# Patient Record
Sex: Female | Born: 1980 | Race: Black or African American | Hispanic: No | Marital: Single
Health system: Midwestern US, Community
[De-identification: ages and names within clinical notes are randomized; demographics above are authoritative.]

## PROBLEM LIST (undated history)

## (undated) DIAGNOSIS — D5700 Hb-SS disease with crisis, unspecified: Secondary | ICD-10-CM

## (undated) DIAGNOSIS — D571 Sickle-cell disease without crisis: Secondary | ICD-10-CM

## (undated) DIAGNOSIS — B009 Herpesviral infection, unspecified: Secondary | ICD-10-CM

## (undated) DIAGNOSIS — R000 Tachycardia, unspecified: Secondary | ICD-10-CM

## (undated) DIAGNOSIS — R110 Nausea: Secondary | ICD-10-CM

## (undated) DIAGNOSIS — F32A Depression, unspecified: Secondary | ICD-10-CM

## (undated) DIAGNOSIS — E559 Vitamin D deficiency, unspecified: Secondary | ICD-10-CM

## (undated) DIAGNOSIS — J189 Pneumonia, unspecified organism: Secondary | ICD-10-CM

## (undated) DIAGNOSIS — G8929 Other chronic pain: Secondary | ICD-10-CM

## (undated) DIAGNOSIS — D649 Anemia, unspecified: Secondary | ICD-10-CM

## (undated) DIAGNOSIS — R0902 Hypoxemia: Secondary | ICD-10-CM

## (undated) HISTORY — DX: Nausea: R11.0

## (undated) HISTORY — DX: Vitamin D deficiency, unspecified: E55.9

## (undated) HISTORY — DX: Sickle-cell disease without crisis: D57.1

## (undated) HISTORY — DX: Herpesviral infection, unspecified: B00.9

---

## 2008-11-09 LAB — HEPATITIS B SURFACE ANTIBODY
Hep B S Ab Interp: POSITIVE
Hep B S Ab: 3.76 Index (ref >1.00)

## 2008-11-09 LAB — HEP B SURFACE AB
Hep B surface Ab Interp.: POSITIVE
Hepatitis B surface Ab: 3.76 Index (ref >1.00)

## 2008-11-10 LAB — VZV AB, IGG: VZV Ab, IgG: 3.52 IV

## 2011-02-04 DIAGNOSIS — Z9289 Personal history of other medical treatment: Secondary | ICD-10-CM

## 2011-02-04 HISTORY — DX: Personal history of other medical treatment: Z92.89

## 2012-06-16 ENCOUNTER — Encounter

## 2012-06-16 MED ORDER — HYDROXYUREA 500 MG CAPSULE
500 mg | ORAL_CAPSULE | Freq: Every day | ORAL | Status: DC
Start: 2012-06-16 — End: 2013-01-12

## 2012-06-16 MED ORDER — TRAMADOL 50 MG TAB
50 mg | ORAL_TABLET | Freq: Three times a day (TID) | ORAL | Status: DC | PRN
Start: 2012-06-16 — End: 2013-01-04

## 2012-06-16 NOTE — Progress Notes (Signed)
Pt left w/o getting labs drawn. Tried to contact patient and phone number that was given not in service.

## 2012-06-16 NOTE — Progress Notes (Signed)
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    Subjective:   Chief complaint: Sickle cell disease/anemia    History of present illness:  Chelsea Webb is a 32 year old woman who has sickle cell, SC.  She recently moved back to this area from [place] and is here for a complete assessment to establish care in this clinic.  She reports that over the past several months she has been experiencing more frequent pain crises for which she takes tramadol 50 mg every 6-8 hours.  She has not been on hydroxyurea but reports that she continues to take folic acid on a regular basis.  There are no other complaints or concerns at this time.    Past Medical History   Diagnosis Date   ??? Chronic pain    ??? Anemia    ??? Musculoskeletal disorder        Allergies no known allergies    No past surgical history on file.    History     Social History   ??? Marital Status: SINGLE     Spouse Name: N/A     Number of Children: N/A   ??? Years of Education: N/A     Occupational History   ??? Not on file.     Social History Main Topics   ??? Smoking status: Never Smoker    ??? Smokeless tobacco: Not on file   ??? Alcohol Use: No   ??? Drug Use: No   ??? Sexually Active: Yes      Comment: LMP 05/29/12     Other Topics Concern   ??? Not on file     Social History Narrative   ??? No narrative on file       Family History   Problem Relation Age of Onset   ??? Diabetes Father        Current Outpatient Prescriptions   Medication Sig Dispense Refill   ??? traMADol (ULTRAM) 50 mg tablet Take 50 mg by mouth every six (6) hours as needed for Pain.         Review of Systems    General ROS:The patient has no complaints and there is no physical distress evident.  Psychological ROS: patient denies having any psychological symptoms such as hallucinations depression or anxiety.  Ophthalmic ROS:the patient denies having any visual impairment or eye discomfort.   ENT ROS: there are no abnormalities reported.  Allergy and Immunology ROS:the patient denies having any seasonal allergies or allergies to medications other than those already outlined above.  Hematological and Lymphatic ROS: the patient denies having any bruising, bleeding or lymphadenopathy.  Endocrine ROS: the patient denies having any heat or cold intolerance.  There is no history of diabetes or thyroid disorders.  Breast ROS: the patient denies having any history of breast mass, nipple discharge, or lumps.  Respiratory ROS:the patient denies having any cough, shortness of breath, or dyspnea on exertion.  Cardiovascular ROS: there are no complaints of chest pain, palpitations, chest pounding, or dyspnea on exertion.  Gastrointestinal ROS: the patient denies having nausea, emesis, diarrhea, constipation, or blood in the stool.  Genito-Urinary ROS: the patient denies having urinary urgency, frequency, or dysuria.  Musculoskeletal ROS: with the exception of mild arthralgias the patient has no other musculoskeletal complaints.  Neurological ROS: the patient denies having any numbness, tingling, or neurologic  deficits.  Dermatological ZOX:WRUEAVW denies having any unexplained rash, skin ulcerations, or hives.      Objective:   BP 131/80   Pulse 83   Temp(Src) 98 ??F (36.7 ??C) (Oral)   Wt 125.646 kg (277 lb)     Physical Exam:   Gen. Appearance: the patient is in no acute distress.  Skin: There is no evidence of bruise or rash.  HEENT: The head is normocephalic and atraumatic.  The conjunctiva and sclera are clear.  Pupils are equal, round, reactive to light, and accommodation.  The extraocular movements are intact.  ENT reveals no oral mucosal lesions or ulcerations.  Neck: Supple without lymphadenopathy or thyromegaly.  Lungs: Clear to auscultation and percussion; there are no wheezes or rhonchi.  Heart: Regular rate and rhythm; there are no murmurs, gallops, or rubs.  Abdomen: Bowel sounds are present and normal.   There is no guarding, tenderness, or hepatosplenomegaly.  Neurologic: There are no focal neurologic deficits.  Lymphatics: There is no palpable peripheral lymphadenopathy.    Lab data:  No data is available         Assessment:   1. 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  I am recommending that we began hydroxyurea 500 mg daily.  I will renew her prescription for tramadol and folate acid and will plan to see her back in the clinic in 2 months.  The patient does not have a primary care physician, therefore, she will be referred to Dr. Levan Hurst.    Orders Placed This Encounter   ??? METABOLIC PANEL, COMPREHENSIVE   ??? IRON PROFILE   ??? FERRITIN   ??? VITAMIN B12 & FOLATE   ??? traMADol (ULTRAM) 50 mg tablet     Sig: Take 50 mg by mouth every six (6) hours as needed for Pain.           Kennon Holter, MD  06/16/2012

## 2012-06-22 LAB — METABOLIC PANEL, COMPREHENSIVE
A-G Ratio: 1.6 (ref 1.1–2.5)
ALT (SGPT): 10 IU/L (ref 0–32)
AST (SGOT): 12 IU/L (ref 0–40)
Albumin: 4.1 g/dL (ref 3.5–5.5)
Alk. phosphatase: 76 IU/L (ref 39–117)
BUN/Creatinine ratio: 17 (ref 8–20)
BUN: 10 mg/dL (ref 6–20)
Bilirubin, total: 0.7 mg/dL (ref 0.0–1.2)
CO2: 25 mmol/L (ref 19–28)
Calcium: 9.7 mg/dL (ref 8.7–10.2)
Chloride: 107 mmol/L (ref 97–108)
Creatinine: 0.6 mg/dL (ref 0.57–1.00)
GFR est AA: 142 mL/min/{1.73_m2} (ref >59)
GFR est non-AA: 124 mL/min/{1.73_m2} (ref >59)
GLOBULIN, TOTAL: 2.6 g/dL (ref 1.5–4.5)
Glucose: 79 mg/dL (ref 65–99)
Potassium: 4.4 mmol/L (ref 3.5–5.2)
Protein, total: 6.7 g/dL (ref 6.0–8.5)
Sodium: 142 mmol/L (ref 134–144)

## 2012-06-22 LAB — IRON PROFILE
Iron % saturation: 20 % (ref 15–55)
Iron: 63 ug/dL (ref 35–155)
TIBC: 308 ug/dL (ref 250–450)
UIBC: 245 ug/dL (ref 150–375)

## 2012-06-22 LAB — VITAMIN B12 & FOLATE
Folate: 13.2 ng/mL (ref >3.0)
Vitamin B12: 189 pg/mL — ABNORMAL LOW (ref 211–946)

## 2012-06-22 LAB — FERRITIN: Ferritin: 105 ng/mL (ref 15–150)

## 2012-06-22 NOTE — Progress Notes (Signed)
Quick Note:    Labs reviewed will start vitamin b12 supplement.  ______

## 2013-01-04 MED ORDER — TRAMADOL 50 MG TAB
50 mg | ORAL_TABLET | Freq: Three times a day (TID) | ORAL | Status: AC | PRN
Start: 2013-01-04 — End: ?

## 2013-01-04 NOTE — Telephone Encounter (Signed)
Pt need refill

## 2013-01-12 LAB — CBC WITH 3 PART DIFF
ABS. LYMPHOCYTES: 4.3 10*3/uL (ref 1.1–5.9)
ABS. MIXED CELLS: 1.9 10*3/uL (ref 0.0–2.3)
ABS. NEUTROPHILS: 15.1 10*3/uL — ABNORMAL HIGH (ref 1.8–9.5)
HCT: 27.9 % — ABNORMAL LOW (ref 36–48)
HGB: 9.3 g/dL — ABNORMAL LOW (ref 12.0–16.0)
LYMPHOCYTES: 20 % (ref 14–44)
MCH: 23.7 PG — ABNORMAL LOW (ref 25.0–35.0)
MCHC: 33.3 g/dL (ref 31–37)
MCV: 71.2 FL — ABNORMAL LOW (ref 78–102)
Mixed cells: 9 % (ref 0.1–17)
NEUTROPHILS: 71 % — ABNORMAL HIGH (ref 40–70)
RBC: 3.92 M/uL — ABNORMAL LOW (ref 4.10–5.10)
RDW: 16.4 % — ABNORMAL HIGH (ref 11.5–14.5)
WBC: 21.3 10*3/uL — CR (ref 4.5–13.0)

## 2013-01-12 MED ORDER — OXYCODONE-ACETAMINOPHEN 5 MG-325 MG TAB
5-325 mg | ORAL_TABLET | ORAL | Status: DC | PRN
Start: 2013-01-12 — End: 2013-03-16

## 2013-01-12 MED ORDER — HYDROXYUREA 500 MG CAPSULE
500 mg | ORAL_CAPSULE | Freq: Every day | ORAL | Status: DC
Start: 2013-01-12 — End: 2013-04-25

## 2013-01-12 NOTE — Progress Notes (Signed)
Hematology/Oncology  Progress Note    Name: Chelsea Webb  Date: 01/12/2013  DOB: [birth year]  Primary Care Provider: No primary provider on file.        Chelsea Webb is a 32 y.o. year old female with sickle cell disease, anemia and frequent pain crisis.     Current Therapy: none    Subjective:     Chelsea Webb is here today for fu ov. She has not been seen since May 2014. She reports never starting the Hydrea because she was concerned with the side effects. She also has not been taking folic acid or Z61 as previously recommended. She has had many frequent pain crisis primarily in her knees and legs but since Saturday has also developed left upper quadrant pain. She has a low grade fever of 100.6 but denies any fevers or chills at home. No recent sick contacts, denies any urinary symptoms such as dysuria or frequency. She has no cough or upper respiratory symptoms. Her WBC is elevated at 21 and she states her baseline is 12. She has no GI or GU complaints today. She has been taking Tramadol up to 6 tablets a day with minimal relief.     The past medical, surgical and social history has been reviewed and remains unchanged.   Past Medical History   Diagnosis Date   ??? Chronic pain    ??? Anemia    ??? Musculoskeletal disorder      No past surgical history on file.  History     Social History   ??? Marital Status: SINGLE     Spouse Name: N/A     Number of Children: N/A   ??? Years of Education: N/A     Occupational History   ??? Not on file.     Social History Main Topics   ??? Smoking status: Never Smoker    ??? Smokeless tobacco: Not on file   ??? Alcohol Use: No   ??? Drug Use: No   ??? Sexually Active: Yes      Comment: LMP 05/29/12     Other Topics Concern   ??? Not on file     Social History Narrative   ??? No narrative on file     Family History   Problem Relation Age of Onset   ??? Diabetes Father      Current Outpatient Prescriptions   Medication Sig Dispense Refill   ??? folic acid (FOLVITE) 1 mg tablet Take  by mouth daily.       ???  oxyCODONE-acetaminophen (PERCOCET) 5-325 mg per tablet Take 1 tablet by mouth every four (4) hours as needed for Pain.  90 tablet  0   ??? hydroxyurea (HYDREA) 500 mg capsule Take 1 capsule by mouth daily.  30 capsule  2   ??? traMADol (ULTRAM) 50 mg tablet Take 1 tablet by mouth every eight (8) hours as needed for Pain.  90 tablet  2       Review of Systems  General :The patient has no complaints and there is no physical distress evident. She has generalized pain esp in the legs.   Psychological : patient denies having any psychological symptoms such as hallucinations depression or anxiety.   Ophthalmic:the patient denies having any visual impairment or eye discomfort.   ENT: there are no abnormalities reported.   Allergy and Immunology:the patient denies having any seasonal allergies or allergies to medications other than those already outlined above.   Hematological and Lymphatic: the patient denies  having any bruising, bleeding or lymphadenopathy.   Endocrine: the patient denies having any heat or cold intolerance. There is no history of diabetes or thyroid disorders.   Respiratory:the patient denies having any cough, shortness of breath, or dyspnea on exertion.   Cardiovascular: there are no complaints of chest pain, palpitations, chest pounding, or dyspnea on exertion. bp is elevated today  Gastrointestinal: the patient denies having nausea, emesis, diarrhea, constipation, or blood in the stool. Pain in the left upper quadrant. No nausea or vomiting.   Genito-Urinary: the patient denies having urinary urgency, frequency, or dysuria.   Musculoskeletal: pain in the bilateral knees, left >right.   Neurological:  denies having any numbness, tingling, or neurologic deficits.   Dermatological: patient denies having any unexplained rash, skin ulcerations, or hives.    Objective:   BP 148/117   Pulse 64   Temp(Src) 100.6 ??F (38.1 ??C) (Oral)   Resp 20   Wt 118.026 kg (260 lb 3.2 oz)  Repeat BP was 128/86 left arm manual   Pain Score: 8/10    Physical Exam:   ECOG: na  General: Well appearing, in NAD, stating she has pain  Skin: examination of the skin reveals no bruising, rash or petechiae  HEENT: Normocephalic, atraumatic. Conjunctiva and sclera are clear. Pupils are equal, round and reactive to light. EOMs are intact. ENT without oral mucosal lesions, stomatitis or thrush  Neck: supple without lymphadenopathy, JVD or thyromegaly  Lymphatics: no palpable cervical, supraclavicular, axillary or inguinal lymphadenopathy  Lungs: clear breath sounds bilaterally, no rhonchi or wheezes noted  Heart: Regular rate and rhythm, no murmurs, rubs or gallops, S1-S2 noted. Positive peripheral pulses bilaterally upper and lower extremities  Abdomen: soft, tender LUQ to palpation. No splenomegaly, non-distended, no HSM, positive bowel sounds  Extremities: without clubbing, cyanosis or edema, no point tenderness.   Neurologic: no focal deficits, steady gait, Alert and oriented x 3.  Psychologic: mood and affect are appropriate, no anxiety or depression noted    Laboratory Data:     Results for orders placed during the hospital encounter of 01/12/13   CBC WITH 3 PART DIFF     Status: Abnormal       Result Value Range Status    WBC 21.3 (*) 4.5 - 13.0 K/uL Final    RBC 3.92 (*) 4.10 - 5.10 M/uL Final    HGB 9.3 (*) 12.0 - 16.0 g/dL Final    HCT 16.1 (*) 36 - 48 % Final    MCV 71.2 (*) 78 - 102 FL Final    MCH 23.7 (*) 25.0 - 35.0 PG Final    MCHC 33.3  31 - 37 g/dL Final    RDW 09.6 (*) 04.5 - 14.5 % Final    NEUTROPHILS 71 (*) 40 - 70 % Final    MIXED CELLS 9  0.1 - 17 % Final    LYMPHOCYTES 20  14 - 44 % Final    ABS. NEUTROPHILS 15.1 (*) 1.8 - 9.5 K/UL Final    ABS. MIXED CELLS 1.9  0.0 - 2.3 K/uL Final    ABS. LYMPHOCYTES 4.3  1.1 - 5.9 K/UL Final    Comment: Test Performed by [facility]. Results reviewed by Medical Director.    DF AUTOMATED   Final            Patient Active Problem List   Diagnosis Code   ??? Sickle cell disease 282.60          Assessment:  1. Sickle cell disease    2. Left upper quadrant pain    3. Sickle cell anemia    4. Thrombocytopenia, unspecified      I have informed the patient that her CBC reveals a WBC of 21, hgb 9.3g/dL, hematocrit of 16.1% and plt count preliminary of 90,000 to be confirmed at [facility].     Plan:   1. Sickle cell disease. She has continued to have frequent pain crisis. We discussed her trying the hydrea 500 mg daily which she is in agreement to start to help better control her crisis. I have also prescribed Percocet 5/325mg  tablets to take 1 tab every 4-6 hours as needed for pain not to be used at the same time as tramadol. I would like her to return in 2 weeks for re-assessment.     2. LUQ pain. I will send the patient for a US of the LUQ to rule out splenic infarction from her sickle cell. She was advised that with severe pain or any changes to go to the ER.     3. Anemia. H/h stable today. Will continue to monitor. Will check iron and ferritin levels as well as b12/folate. Recommended she go back on her Folic acid 1 mg daily and b12 daily as previously recommended.     4. Thrombocytopenia. Will need to send labs to [facility] for final results as plt count came back at 90,000. There are no signs of bleeding or bruising and she will have this rechecked again in 2 weeks.     5. HTN. BP is elevated today. She has not established care with Dr. Nedra Hai yet but I have encouraged her to do so. Repeat BP after our visit was 128/86. This will be checked again in 2 weeks. Should she need treatment for htn she will def need a PCP. This is likely elevated due to her pain today as well.      Follow-up Disposition:  Return in about 2 weeks (around 01/26/2013).   Orders Placed This Encounter   ??? COMPLETE CBC & AUTO DIFF WBC   ??? [place] ABD COMP     At [facility].     Standing Status: Future      Number of Occurrences:       Standing Expiration Date: 02/12/2014     Order Specific Question:  Reason for Exam     Answer:  spleen pain, ru  infarction sickle cell     Order Specific Question:  Is Patient Pregnant?     Answer:  Unknown   ??? IRON PROFILE   ??? FERRITIN   ??? VITAMIN B12 & FOLATE   ??? METABOLIC PANEL, COMPREHENSIVE   ??? InHouse CBC ([facility])     Standing Status: Future      Number of Occurrences: 1      Standing Expiration Date: 01/19/2013   ??? RETICULOCYTE COUNT   ??? folic acid (FOLVITE) 1 mg tablet     Sig: Take  by mouth daily.   ??? oxyCODONE-acetaminophen (PERCOCET) 5-325 mg per tablet     Sig: Take 1 tablet by mouth every four (4) hours as needed for Pain.     Dispense:  90 tablet     Refill:  0   ??? hydroxyurea (HYDREA) 500 mg capsule     Sig: Take 1 capsule by mouth daily.     Dispense:  30 capsule     Refill:  2       Kingsley Callander, NP  01/12/2013

## 2013-01-12 NOTE — Progress Notes (Signed)
Quick Note:    Labs reviewed and noted.  ______

## 2013-01-13 LAB — PLATELET COUNT: PLATELET: 102 10*3/uL — ABNORMAL LOW (ref 135–420)

## 2013-01-13 NOTE — Progress Notes (Signed)
Quick Note:    Labs reviewed and noted.  ______

## 2013-02-07 LAB — CBC WITH 3 PART DIFF
ABS. LYMPHOCYTES: 2.2 10*3/uL (ref 1.1–5.9)
ABS. MIXED CELLS: 0.5 10*3/uL (ref 0.0–2.3)
ABS. NEUTROPHILS: 3.4 10*3/uL (ref 1.8–9.5)
HCT: 34.3 % — ABNORMAL LOW (ref 36–48)
HGB: 11.6 g/dL — ABNORMAL LOW (ref 12.0–16.0)
LYMPHOCYTES: 36 % (ref 14–44)
MCH: 25.2 PG (ref 25.0–35.0)
MCHC: 33.8 g/dL (ref 31–37)
MCV: 74.4 FL — ABNORMAL LOW (ref 78–102)
Mixed cells: 9 % (ref 0.1–17)
NEUTROPHILS: 56 % (ref 40–70)
PLATELET: 189 10*3/uL (ref 140–440)
RBC: 4.61 M/uL (ref 4.10–5.10)
RDW: 18.5 % — ABNORMAL HIGH (ref 11.5–14.5)
WBC: 6.1 10*3/uL (ref 4.5–13.0)

## 2013-02-07 NOTE — Progress Notes (Signed)
Hematology/medical oncology progress note    02/07/2013  Donzetta StarchKimaada Webb  Date of birth: [birth year]    Diagnosis: Sickle cell disease and a recent experience with abdominal pain    Ms. Chelsea Webb is a 33 year old woman who came to clinic a few weeks ago complaining of some left-sided abdominal pain.   A CT scan of the abdomen was done and this did not reveal any evidence of splenic infarct, inflammatory changes, or lymphadenopathy.  She does have mild splenomegaly due to her underlying sickle cell disease.  There was no evidence of intra-abdominal or mesenteric thrombosis. No further diagnostic evaluation is warranted.  I will continue to see the patient in clinic every 2-3 months on an as-needed basis.  (I discussed the recent CT scan results and answered the patient's questions to her satisfaction over 30 min.  Greater than 60% of my time was in care coordination.)    Sharon SellerLloyd A. Alanda SlimShabazz, MD, Jerrel IvoryFACP

## 2013-02-07 NOTE — Progress Notes (Signed)
Quick Note:    Results reviewed and noted.  ______

## 2013-03-16 NOTE — Telephone Encounter (Signed)
Pt need refill

## 2013-03-17 MED ORDER — OXYCODONE-ACETAMINOPHEN 5 MG-325 MG TAB
5-325 mg | ORAL_TABLET | ORAL | Status: DC | PRN
Start: 2013-03-17 — End: 2013-04-18

## 2013-04-13 LAB — CBC WITH 3 PART DIFF
ABS. LYMPHOCYTES: 1.9 10*3/uL (ref 1.1–5.9)
ABS. MIXED CELLS: 0.5 10*3/uL (ref 0.0–2.3)
ABS. NEUTROPHILS: 3.4 10*3/uL (ref 1.8–9.5)
HCT: 33.4 % — ABNORMAL LOW (ref 36–48)
HGB: 11.6 g/dL — ABNORMAL LOW (ref 12.0–16.0)
LYMPHOCYTES: 33 % (ref 14–44)
MCH: 25.7 PG (ref 25.0–35.0)
MCHC: 34.7 g/dL (ref 31–37)
MCV: 74.1 FL — ABNORMAL LOW (ref 78–102)
Mixed cells: 9 % (ref 0.1–17)
NEUTROPHILS: 58 % (ref 40–70)
PLATELET: 205 10*3/uL (ref 140–440)
RBC: 4.51 M/uL (ref 4.10–5.10)
RDW: 16.1 % — ABNORMAL HIGH (ref 11.5–14.5)
WBC: 5.8 10*3/uL (ref 4.5–13.0)

## 2013-04-13 NOTE — Progress Notes (Signed)
Quick Note:    Results reviewed and noted.  ______

## 2013-04-13 NOTE — Progress Notes (Signed)
Hematology/Oncology  Progress Note    Name: Chelsea Webb  Date: 04/13/2013  DOB: [birth year]  Primary Care Provider: No primary provider on file.        Ms. Chelsea Webb is a 33 y.o. year old female with sickle cell disease, anemia and frequent pain crisis.     Current Therapy: her current base pain medication, hydroxyurea 500 mg daily    Subjective:     Ms. [name] is here today for fu ov. The patient reports that she had a vaso-occlusive pain crisis about 2 weeks ago that lasted for a week.  She did call and get a new prescription for pain medication.  The patient also was able to hydrate adequately using water and Gatorade.  She is beginning to realize that as she gets older and the pain crises associated with her sickle cell becoming more frequent.  Today she is not complaining of any pain or discomfort.     The past medical, surgical and social history has been reviewed and remains unchanged.   Past Medical History   Diagnosis Date   ??? Chronic pain    ??? Anemia    ??? Musculoskeletal disorder      No past surgical history on file.  History     Social History   ??? Marital Status: SINGLE     Spouse Name: N/A     Number of Children: N/A   ??? Years of Education: N/A     Occupational History   ??? Not on file.     Social History Main Topics   ??? Smoking status: Never Smoker    ??? Smokeless tobacco: Not on file   ??? Alcohol Use: No   ??? Drug Use: No   ??? Sexual Activity: Yes      Comment: LMP 05/29/12     Other Topics Concern   ??? Not on file     Social History Narrative   ??? No narrative on file     Family History   Problem Relation Age of Onset   ??? Diabetes Father      Current Outpatient Prescriptions   Medication Sig Dispense Refill   ??? oxyCODONE-acetaminophen (PERCOCET) 5-325 mg per tablet Take 1 Tab by mouth every four (4) hours as needed for Pain.  90 Tab  0   ??? folic acid (FOLVITE) 1 mg tablet Take  by mouth daily.       ??? hydroxyurea (HYDREA) 500 mg capsule Take 1 capsule by mouth daily.  30 capsule  2   ??? traMADol (ULTRAM) 50 mg  tablet Take 1 tablet by mouth every eight (8) hours as needed for Pain.  90 tablet  2       Review of Systems  General :The patient has no complaints and there is no physical distress evident. She has generalized pain esp in the legs.   Psychological : patient denies having any psychological symptoms such as hallucinations depression or anxiety.   Ophthalmic:the patient denies having any visual impairment or eye discomfort.   ENT: there are no abnormalities reported.   Allergy and Immunology:the patient denies having any seasonal allergies or allergies to medications other than those already outlined above.   Hematological and Lymphatic: the patient denies having any bruising, bleeding or lymphadenopathy.   Endocrine: the patient denies having any heat or cold intolerance. There is no history of diabetes or thyroid disorders.   Respiratory:the patient denies having any cough, shortness of breath, or dyspnea on exertion.  Cardiovascular: there are no complaints of chest pain, palpitations, chest pounding, or dyspnea on exertion. bp is elevated today  Gastrointestinal: the patient denies having nausea, emesis, diarrhea, constipation, or blood in the stool. Pain in the left upper quadrant. No nausea or vomiting.   Genito-Urinary: the patient denies having urinary urgency, frequency, or dysuria.   Musculoskeletal: pain in the bilateral knees, left >right.   Neurological:  denies having any numbness, tingling, or neurologic deficits.   Dermatological: patient denies having any unexplained rash, skin ulcerations, or hives.    Objective:   BP 135/72    Pulse 83    Temp(Src) 98.1 ??F (36.7 ??C) (Oral)    Wt 119.75 kg (264 lb)   Repeat BP was 128/86 left arm manual  Pain Score: 8/10    Physical Exam:   ECOG: na  General: Well appearing, in NAD, stating she has pain  Skin: examination of the skin reveals no bruising, rash or petechiae  HEENT: Normocephalic, atraumatic. Conjunctiva and sclera are clear. Pupils are equal, round  and reactive to light. EOMs are intact. ENT without oral mucosal lesions, stomatitis or thrush  Neck: supple without lymphadenopathy, JVD or thyromegaly  Lymphatics: no palpable cervical, supraclavicular, axillary or inguinal lymphadenopathy  Lungs: clear breath sounds bilaterally, no rhonchi or wheezes noted  Heart: Regular rate and rhythm, no murmurs, rubs or gallops, S1-S2 noted. Positive peripheral pulses bilaterally upper and lower extremities  Abdomen: soft, tender LUQ to palpation. No splenomegaly, non-distended, no HSM, positive bowel sounds  Extremities: without clubbing, cyanosis or edema, no point tenderness.   Neurologic: no focal deficits, steady gait, Alert and oriented x 3.  Psychologic: mood and affect are appropriate, no anxiety or depression noted    Laboratory Data:     Results for orders placed during the hospital encounter of 04/13/13   CBC WITH 3 PART DIFF     Status: Abnormal       Result Value Ref Range Status    WBC 5.8  4.5 - 13.0 K/uL Final    RBC 4.51  4.10 - 5.10 M/uL Final    HGB 11.6 (*) 12.0 - 16.0 g/dL Final    HCT 16.1 (*) 36 - 48 % Final    MCV 74.1 (*) 78 - 102 FL Final    MCH 25.7  25.0 - 35.0 PG Final    MCHC 34.7  31 - 37 g/dL Final    RDW 09.6 (*) 04.5 - 14.5 % Final    PLATELET 205  140 - 440 K/uL Final    NEUTROPHILS 58  40 - 70 % Final    MIXED CELLS 9  0.1 - 17 % Final    LYMPHOCYTES 33  14 - 44 % Final    ABS. NEUTROPHILS 3.4  1.8 - 9.5 K/UL Final    ABS. MIXED CELLS 0.5  0.0 - 2.3 K/uL Final    ABS. LYMPHOCYTES 1.9  1.1 - 5.9 K/UL Final    Comment: Test Performed by [facility]. Results reviewed by Medical Director.    DF AUTOMATED   Final            Patient Active Problem List   Diagnosis Code   ??? Sickle cell disease 282.60         Assessment:     1. Sickle cell disease      Plan:   Sickle cell disease: the vaso-occlusive pain crisis associated with sickle cell disease are currently more frequently.  I have instructed  the patient to continue taking the hydroxyurea on  a daily basis.  She also will be able to keep Percocet 5 mg on hand so it first sentence of pain she was instructed to begin using the medication in combination with tramadol.  The patient was also instructed to use Gatorade and water to remain well hydrated throughout the day.  A company as a metabolic panel and liver enzymes will be obtained at this time along with the iron profile and ferritin levels.     Anemia: the current CBC shows a hemoglobin is 11.6 g/dL and hematocrit is 09.8%33.4%.  I will check iron profile and ferritin level.  As long as the ferritin level stays below 1000 she will not be required to go on therapy with Xjade.    Thrombocytopenia: resolved, her current platelet count is 205,000.     I will schedule the patient for followup visit in 2 months.      Follow-up Disposition:  Return in about 2 months (around 06/13/2013).   Orders Placed This Encounter   ??? COMPLETE CBC & AUTO DIFF WBC   ??? InHouse CBC ([facility])     Standing Status: Future      Number of Occurrences: 1      Standing Expiration Date: 04/20/2013   ??? IRON PROFILE   ??? METABOLIC PANEL, COMPREHENSIVE   ??? FERRITIN       Kennon HolterLloyd A [place], MD  04/13/2013

## 2013-04-14 LAB — METABOLIC PANEL, COMPREHENSIVE
A-G Ratio: 1.6 ratio (ref 1.1–2.6)
ALT (SGPT): 16 U/L (ref 5–40)
AST (SGOT): 11 U/L (ref 10–37)
Albumin: 4.2 g/dL (ref 3.5–5.0)
Alk. phosphatase: 83 U/L (ref 25–115)
Anion gap: 11 mmol/L
BUN: 9 mg/dL (ref 6–22)
Bilirubin, total: 0.4 mg/dL (ref 0.2–1.2)
CO2: 27 mmol/L (ref 20–32)
Calcium: 9.4 mg/dL (ref 8.4–10.5)
Chloride: 106 mmol/L (ref 98–110)
Creatinine: 0.5 mg/dL (ref 0.5–1.2)
GFRAA: >60 (ref >60.0)
GFRNA: >60 (ref >60.0)
Globulin: 2.7 g/dL (ref 2.0–4.0)
Glucose: 88 mg/dL (ref 65–99)
Potassium: 4.6 mmol/L (ref 3.5–5.5)
Protein, total: 6.9 g/dL (ref 6.4–8.3)
Sodium: 144 mmol/L (ref 133–145)

## 2013-04-14 LAB — IRON PROFILE
Iron % saturation: 18 % — ABNORMAL LOW (ref 20–50)
Iron: 52 ug/dL (ref 30–160)
TIBC: 285 ug/dL (ref 228–428)
UIBC: 233 ug/dL (ref 110–370)

## 2013-04-14 LAB — FERRITIN: Ferritin: 87 ng/mL (ref 10–291)

## 2013-04-14 NOTE — Progress Notes (Signed)
Quick Note:    Labs reviewed and noted. Will continue to monitor.  ______

## 2013-04-18 NOTE — Telephone Encounter (Signed)
Pt need refill

## 2013-04-19 MED ORDER — OXYCODONE-ACETAMINOPHEN 5 MG-325 MG TAB
5-325 mg | ORAL_TABLET | ORAL | Status: DC | PRN
Start: 2013-04-19 — End: 2013-07-21

## 2013-04-25 MED ORDER — HYDROXYUREA 500 MG CAPSULE
500 mg | ORAL_CAPSULE | Freq: Every day | ORAL | Status: DC
Start: 2013-04-25 — End: 2013-08-29

## 2013-06-15 LAB — CBC WITH 3 PART DIFF
ABS. LYMPHOCYTES: 2 10*3/uL (ref 1.1–5.9)
ABS. MIXED CELLS: 0.5 10*3/uL (ref 0.0–2.3)
ABS. NEUTROPHILS: 3.7 10*3/uL (ref 1.8–9.5)
HCT: 33.6 % — ABNORMAL LOW (ref 36–48)
HGB: 11.5 g/dL — ABNORMAL LOW (ref 12.0–16.0)
LYMPHOCYTES: 33 % (ref 14–44)
MCH: 25.5 PG (ref 25.0–35.0)
MCHC: 34.2 g/dL (ref 31–37)
MCV: 74.5 FL — ABNORMAL LOW (ref 78–102)
Mixed cells: 8 % (ref 0.1–17)
NEUTROPHILS: 60 % (ref 40–70)
PLATELET: 211 10*3/uL (ref 140–440)
RBC: 4.51 M/uL (ref 4.10–5.10)
RDW: 16.6 % — ABNORMAL HIGH (ref 11.5–14.5)
WBC: 6.2 10*3/uL (ref 4.5–13.0)

## 2013-06-15 NOTE — Progress Notes (Signed)
Hematology/Oncology  Progress Note    Name: Chelsea Webb  Date: 06/15/2013  DOB: December 28, 1980  Primary Care Provider: No primary provider on file.        Chelsea Webb is a 33 y.o. year old female with sickle cell disease, anemia and frequent pain crisis.     Current Therapy: her current base pain medication, hydroxyurea 500 mg daily    Subjective:     Chelsea Webb is here today for fu ov. The patient reports has been doing well and has not had a vaso-occlusive pain crisis since her last clinic visit.  She has not required the use of her pain medication and therefore she has plenty of medication at home.  She will not need a new prescription filled at this time.  She has been continue to hydrate herself adequately using a combination of water and Gatorade and this appears to be working quite well.  She has no new complaints or concerns reported at this time.           The past medical, surgical and social history has been reviewed and remains unchanged.   Past Medical History   Diagnosis Date   ??? Chronic pain    ??? Anemia    ??? Musculoskeletal disorder      No past surgical history on file.  History     Social History   ??? Marital Status: SINGLE     Spouse Name: N/A     Number of Children: N/A   ??? Years of Education: N/A     Occupational History   ??? Not on file.     Social History Main Topics   ??? Smoking status: Never Smoker    ??? Smokeless tobacco: Not on file   ??? Alcohol Use: No   ??? Drug Use: No   ??? Sexual Activity: Yes      Comment: LMP 05/29/12     Other Topics Concern   ??? Not on file     Social History Narrative   ??? No narrative on file     Family History   Problem Relation Age of Onset   ??? Diabetes Father      Current Outpatient Prescriptions   Medication Sig Dispense Refill   ??? hydroxyurea (HYDREA) 500 mg capsule Take 1 Cap by mouth daily.  30 Cap  2   ??? oxyCODONE-acetaminophen (PERCOCET) 5-325 mg per tablet Take 1 Tab by mouth every four (4) hours as needed for Pain.  90 Tab  0   ??? folic acid (FOLVITE) 1 mg tablet Take   by mouth daily.       ??? traMADol (ULTRAM) 50 mg tablet Take 1 tablet by mouth every eight (8) hours as needed for Pain.  90 tablet  2       Review of Systems  General :The patient has no complaints and there is no physical distress evident. She has generalized pain esp in the legs.   Psychological : patient denies having any psychological symptoms such as hallucinations depression or anxiety.   Ophthalmic:the patient denies having any visual impairment or eye discomfort.   ENT: there are no abnormalities reported.   Allergy and Immunology:the patient denies having any seasonal allergies or allergies to medications other than those already outlined above.   Hematological and Lymphatic: the patient denies having any bruising, bleeding or lymphadenopathy.   Endocrine: the patient denies having any heat or cold intolerance. There is no history of diabetes or thyroid disorders.  Respiratory:the patient denies having any cough, shortness of breath, or dyspnea on exertion.   Cardiovascular: there are no complaints of chest pain, palpitations, chest pounding, or dyspnea on exertion. bp is elevated today  Gastrointestinal: the patient denies having nausea, emesis, diarrhea, constipation, or blood in the stool. Pain in the left upper quadrant. No nausea or vomiting.   Genito-Urinary: the patient denies having urinary urgency, frequency, or dysuria.   Musculoskeletal: pain in the bilateral knees, left >right.   Neurological:  denies having any numbness, tingling, or neurologic deficits.   Dermatological: patient denies having any unexplained rash, skin ulcerations, or hives.    Objective:   BP 132/72   Pulse 83   Temp(Src) 97.6 ??F (36.4 ??C) (Oral)  Repeat BP was 128/86 left arm manual  Pain Score: 8/10    Physical Exam:   ECOG: na  General: Well appearing, in NAD, stating she has pain  Skin: examination of the skin reveals no bruising, rash or petechiae  HEENT: Normocephalic, atraumatic. Conjunctiva and sclera are clear.  Pupils are equal, round and reactive to light. EOMs are intact. ENT without oral mucosal lesions, stomatitis or thrush  Neck: supple without lymphadenopathy, JVD or thyromegaly  Lymphatics: no palpable cervical, supraclavicular, axillary or inguinal lymphadenopathy  Lungs: clear breath sounds bilaterally, no rhonchi or wheezes noted  Heart: Regular rate and rhythm, no murmurs, rubs or gallops, S1-S2 noted. Positive peripheral pulses bilaterally upper and lower extremities  Abdomen: soft, tender LUQ to palpation. No splenomegaly, non-distended, no HSM, positive bowel sounds  Extremities: without clubbing, cyanosis or edema, no point tenderness.   Neurologic: no focal deficits, steady gait, Alert and oriented x 3.  Psychologic: mood and affect are appropriate, no anxiety or depression noted    Laboratory Data:     Results for orders placed during the hospital encounter of 06/15/13   CBC WITH 3 PART DIFF     Status: Abnormal       Result Value Ref Range Status    WBC 6.2  4.5 - 13.0 K/uL Final    RBC 4.51  4.10 - 5.10 M/uL Final    HGB 11.5 (*) 12.0 - 16.0 g/dL Final    HCT 84.6 (*) 36 - 48 % Final    MCV 74.5 (*) 78 - 102 FL Final    MCH 25.5  25.0 - 35.0 PG Final    MCHC 34.2  31 - 37 g/dL Final    RDW 96.2 (*) 95.2 - 14.5 % Final    PLATELET 211  140 - 440 K/uL Final    NEUTROPHILS 60  40 - 70 % Final    MIXED CELLS 8  0.1 - 17 % Final    LYMPHOCYTES 33  14 - 44 % Final    ABS. NEUTROPHILS 3.7  1.8 - 9.5 K/UL Final    ABS. MIXED CELLS 0.5  0.0 - 2.3 K/uL Final    ABS. LYMPHOCYTES 2.0  1.1 - 5.9 K/UL Final    Comment: Test Performed by [facility]. Results reviewed by Medical Director.    DF AUTOMATED   Final            Patient Active Problem List   Diagnosis Code   ??? Sickle cell disease (HCC) 282.60         Assessment:     1. Sickle cell disease, with unspecified crisis (HCC)      Plan:   Sickle cell disease: clinically, the patient is doing well.  She  continues to use Percocet 5 mg on an as needed basis for pain  control.  She has hydrating herself adequately with a combination of water and Gatorade.  She has no new complaints or concerns to report currently.  I will recheck her comprehensive metabolic panel, iron profile, and ferritin level at this time.  Followup will occur in 2 months.        Anemia: the current CBC shows a hemoglobin is 11.5 g/dL and hematocrit is 16.1%33.6%.  I will check iron profile and ferritin level.  As long as the ferritin level stays below 1000 she will not be required to go on therapy with Xjade.    Thrombocytopenia: resolved, her current platelet count is 211,000.     I will schedule the patient for a followup visit in 2 months.      Follow-up Disposition: Not on File   Orders Placed This Encounter   ??? COMPLETE CBC & AUTO DIFF WBC   ??? METABOLIC PANEL, COMPREHENSIVE   ??? IRON PROFILE   ??? FERRITIN   ??? VITAMIN B12 & FOLATE   ??? InHouse CBC ([facility])     Standing Status: Future      Number of Occurrences: 1      Standing Expiration Date: 06/22/2013       Kennon HolterLloyd A Rachael Ferrie, MD  06/15/2013

## 2013-06-15 NOTE — Progress Notes (Signed)
Quick Note:    Results reviewed and noted.  ______

## 2013-06-16 LAB — METABOLIC PANEL, COMPREHENSIVE
A-G Ratio: 1.6 ratio (ref 1.1–2.6)
ALT (SGPT): 15 U/L (ref 5–40)
AST (SGOT): 16 U/L (ref 10–37)
Albumin: 4.1 g/dL (ref 3.5–5.0)
Alk. phosphatase: 86 U/L (ref 25–115)
Anion gap: 14 mmol/L
BUN: 10 mg/dL (ref 6–22)
Bilirubin, total: 0.4 mg/dL (ref 0.2–1.2)
CO2: 23 mmol/L (ref 20–32)
Calcium: 9.3 mg/dL (ref 8.4–10.5)
Chloride: 106 mmol/L (ref 98–110)
Creatinine: 0.5 mg/dL (ref 0.5–1.2)
GFRAA: >60 (ref >60.0)
GFRNA: >60 (ref >60.0)
Globulin: 2.6 g/dL (ref 2.0–4.0)
Glucose: 85 mg/dL (ref 65–99)
Potassium: 4.2 mmol/L (ref 3.5–5.5)
Protein, total: 6.7 g/dL (ref 6.4–8.3)
Sodium: 143 mmol/L (ref 133–145)

## 2013-06-16 LAB — VITAMIN B12 & FOLATE
Folate: 10.72 ng/mL (ref >=3.10)
Vitamin B12: 200 pg/mL — ABNORMAL LOW (ref 211–911)

## 2013-06-16 LAB — IRON PROFILE
Iron % saturation: 19 % — ABNORMAL LOW (ref 20–50)
Iron: 59 ug/dL (ref 30–160)
TIBC: 312 ug/dL (ref 228–428)
UIBC: 253 ug/dL (ref 110–370)

## 2013-06-16 LAB — FERRITIN: Ferritin: 79 ng/mL (ref 10–291)

## 2013-06-16 NOTE — Progress Notes (Signed)
Quick Note:    Results reviewed and noted.  ______

## 2013-07-21 MED ORDER — OXYCODONE-ACETAMINOPHEN 5 MG-325 MG TAB
5-325 mg | ORAL_TABLET | ORAL | Status: DC | PRN
Start: 2013-07-21 — End: 2013-08-30

## 2013-07-21 NOTE — Telephone Encounter (Signed)
Pt need refill

## 2013-08-29 ENCOUNTER — Encounter

## 2013-08-29 MED ORDER — HYDROXYUREA 500 MG CAPSULE
500 mg | ORAL_CAPSULE | Freq: Every day | ORAL | Status: DC
Start: 2013-08-29 — End: 2013-08-30

## 2013-08-30 MED ORDER — OXYCODONE-ACETAMINOPHEN 5 MG-325 MG TAB
5-325 mg | ORAL_TABLET | ORAL | Status: DC | PRN
Start: 2013-08-30 — End: 2013-11-03

## 2013-08-30 MED ORDER — HYDROXYUREA 500 MG CAPSULE
500 mg | ORAL_CAPSULE | Freq: Every day | ORAL | Status: AC
Start: 2013-08-30 — End: ?

## 2013-08-30 NOTE — Telephone Encounter (Signed)
Pt need refills

## 2013-09-07 LAB — CBC WITH 3 PART DIFF
ABS. LYMPHOCYTES: 2.2 10*3/uL (ref 1.1–5.9)
ABS. MIXED CELLS: 0.6 10*3/uL (ref 0.0–2.3)
ABS. NEUTROPHILS: 5.3 10*3/uL (ref 1.8–9.5)
HCT: 35.9 % — ABNORMAL LOW (ref 36–48)
HGB: 12.2 g/dL (ref 12.0–16.0)
LYMPHOCYTES: 28 % (ref 14–44)
MCH: 25.1 PG (ref 25.0–35.0)
MCHC: 34 g/dL (ref 31–37)
MCV: 73.9 FL — ABNORMAL LOW (ref 78–102)
Mixed cells: 7 % (ref 0.1–17)
NEUTROPHILS: 66 % (ref 40–70)
PLATELET: 232 10*3/uL (ref 140–440)
RBC: 4.86 M/uL (ref 4.10–5.10)
RDW: 16.4 % — ABNORMAL HIGH (ref 11.5–14.5)
WBC: 8.1 10*3/uL (ref 4.5–13.0)

## 2013-09-07 NOTE — Progress Notes (Signed)
Quick Note:        Results reviewed and noted.    ______

## 2013-09-07 NOTE — Progress Notes (Signed)
Hematology/Oncology  Progress Note    Name: Chelsea Webb  Date: 09/07/2013  DOB: 12-26-1980  Primary Care Provider: No primary care provider on file.        Chelsea Webb is a 33 year old female with sickle cell disease, anemia and frequent pain crisis.     Current Therapy: her current base pain medication, hydroxyurea 500 mg daily    Subjective:     Chelsea Webb is here today for fu ov. The patient reports that she has been doing well and has not had a vaso-occlusive pain crisis since her last clinic visit.  She has not required the use of her pain medication and therefore she has plenty of medication at home.  She will not need a new prescription filled at this time.  She has been continue to hydrate herself adequately using a combination of water and Gatorade and this appears to be working quite well.  She has no new complaints or concerns reported at this time.           The past medical, surgical and social history has been reviewed and remains unchanged.   Past Medical History   Diagnosis Date   ??? Chronic pain    ??? Anemia    ??? Musculoskeletal disorder      No past surgical history on file.  History     Social History   ??? Marital Status: SINGLE     Spouse Name: N/A     Number of Children: N/A   ??? Years of Education: N/A     Occupational History   ??? Not on file.     Social History Main Topics   ??? Smoking status: Never Smoker    ??? Smokeless tobacco: Not on file   ??? Alcohol Use: No   ??? Drug Use: No   ??? Sexual Activity: Yes      Comment: LMP 05/29/12     Other Topics Concern   ??? Not on file     Social History Narrative   ??? No narrative on file     Family History   Problem Relation Age of Onset   ??? Diabetes Father      Current Outpatient Prescriptions   Medication Sig Dispense Refill   ??? hydroxyurea (HYDREA) 500 mg capsule Take 1 Cap by mouth daily. 30 Cap 2   ??? oxyCODONE-acetaminophen (PERCOCET) 5-325 mg per tablet Take 1 Tab by mouth every four (4) hours as needed for Pain. 90 Tab 0    ??? folic acid (FOLVITE) 1 mg tablet Take  by mouth daily.     ??? traMADol (ULTRAM) 50 mg tablet Take 1 tablet by mouth every eight (8) hours as needed for Pain. 90 tablet 2       Review of Systems  General :The patient has no complaints and there is no physical distress evident. She has generalized pain esp in the legs.   Psychological : patient denies having any psychological symptoms such as hallucinations depression or anxiety.   Ophthalmic:the patient denies having any visual impairment or eye discomfort.   ENT: there are no abnormalities reported.   Allergy and Immunology:the patient denies having any seasonal allergies or allergies to medications other than those already outlined above.   Hematological and Lymphatic: the patient denies having any bruising, bleeding or lymphadenopathy.   Endocrine: the patient denies having any heat or cold intolerance. There is no history of diabetes or thyroid disorders.   Respiratory:the patient denies having any  cough, shortness of breath, or dyspnea on exertion.   Cardiovascular: there are no complaints of chest pain, palpitations, chest pounding, or dyspnea on exertion. bp is elevated today  Gastrointestinal: the patient denies having nausea, emesis, diarrhea, constipation, or blood in the stool. Pain in the left upper quadrant. No nausea or vomiting.   Genito-Urinary: the patient denies having urinary urgency, frequency, or dysuria.   Musculoskeletal: pain in the bilateral knees, left >right.   Neurological:  denies having any numbness, tingling, or neurologic deficits.   Dermatological: patient denies having any unexplained rash, skin ulcerations, or hives.    Objective:   BP 134/98 mmHg   Pulse 90   Temp(Src) 98.6 ??F (37 ??C) (Oral)   Ht 5\' 4"  (1.626 m)   Wt 126.554 kg (279 lb)   BMI 47.87 kg/m2  Repeat BP was 128/86 left arm manual  Pain Score: 8/10    Physical Exam:   ECOG: na  General: Well appearing, in NAD, stating she has pain   Skin: examination of the skin reveals no bruising, rash or petechiae  HEENT: Normocephalic, atraumatic. Conjunctiva and sclera are clear. Pupils are equal, round and reactive to light. EOMs are intact. ENT without oral mucosal lesions, stomatitis or thrush  Neck: supple without lymphadenopathy, JVD or thyromegaly  Lymphatics: no palpable cervical, supraclavicular, axillary or inguinal lymphadenopathy  Lungs: clear breath sounds bilaterally, no rhonchi or wheezes noted  Heart: Regular rate and rhythm, no murmurs, rubs or gallops, S1-S2 noted. Positive peripheral pulses bilaterally upper and lower extremities  Abdomen: soft, tender LUQ to palpation. No splenomegaly, non-distended, no HSM, positive bowel sounds  Extremities: without clubbing, cyanosis or edema, no point tenderness.   Neurologic: no focal deficits, steady gait, Alert and oriented x 3.  Psychologic: mood and affect are appropriate, no anxiety or depression noted    Laboratory Data:     Results for orders placed or performed during the hospital encounter of 09/07/13   CBC WITH 3 PART DIFF     Status: Abnormal   Result Value Ref Range Status    WBC 8.1 4.5 - 13.0 K/uL Final    RBC 4.86 4.10 - 5.10 M/uL Final    HGB 12.2 12.0 - 16.0 g/dL Final    HCT 10.2 (L) 36 - 48 % Final    MCV 73.9 (L) 78 - 102 FL Final    MCH 25.1 25.0 - 35.0 PG Final    MCHC 34.0 31 - 37 g/dL Final    RDW 72.5 (H) 36.6 - 14.5 % Final    PLATELET 232 140 - 440 K/uL Final    NEUTROPHILS 66 40 - 70 % Final  MIXED CELLS 7 0.1 - 17 % Final    LYMPHOCYTES 28 14 - 44 % Final    ABS. NEUTROPHILS 5.3 1.8 - 9.5 K/UL Final    ABS. MIXED CELLS 0.6 0.0 - 2.3 K/uL Final    ABS. LYMPHOCYTES 2.2 1.1 - 5.9 K/UL Final     Comment: Test Performed by [facility]. Results reviewed by Medical Director.    DF AUTOMATED   Final            Patient Active Problem List   Diagnosis Code   ??? Sickle cell disease (HCC) 282.60   ??? Sickle cell anemia (HCC) 282.60         Assessment:      1. Sickle cell disease, with unspecified crisis (HCC)    2. Sickle cell anemia, with unspecified crisis (  HCC)      Plan:   Sickle cell disease: clinically, the patient is doing well.  She continues to use Percocet 5 mg on an as needed basis for pain control.  She has hydrating herself adequately with a combination of water and Gatorade.  She has no new complaints or concerns to report currently.  I will recheck her comprehensive metabolic panel, iron profile, and ferritin level at this time.  Followup will occur in 2 months.        Anemia: the current CBC shows a hemoglobin is 12.2 g/dL and hematocrit is 69.6%35.9%.  I will check iron profile and ferritin level.  As long as the ferritin level stays below 1000 she will not be required to go on therapy with Xjade.    Thrombocytopenia: resolved, her current platelet count is 232,000.     Total time 40 min. Greater than 50% of the time was in counseling and coordination of care. I will schedule the patient for a followup visit in 2 months.      Follow-up Disposition:  Return in about 2 months (around 11/07/2013).   Orders Placed This Encounter   ??? COMPLETE CBC & AUTO DIFF WBC   ??? InHouse CBC ([facility])     Standing Status: Future      Number of Occurrences: 1      Standing Expiration Date: 09/14/2013   ??? METABOLIC PANEL, COMPREHENSIVE   ??? IRON PROFILE   ??? FERRITIN       Kennon HolterLLOYD A Symeon Puleo, MD  09/07/2013

## 2013-09-08 LAB — METABOLIC PANEL, COMPREHENSIVE
A-G Ratio: 1.7 ratio (ref 1.1–2.6)
ALT (SGPT): 18 U/L (ref 5–40)
AST (SGOT): 17 U/L (ref 10–37)
Albumin: 4.3 g/dL (ref 3.5–5.0)
Alk. phosphatase: 84 U/L (ref 25–115)
Anion gap: 13 mmol/L
BUN: 8 mg/dL (ref 6–22)
Bilirubin, total: 0.5 mg/dL (ref 0.2–1.2)
CO2: 22 mmol/L (ref 20–32)
Calcium: 9.2 mg/dL (ref 8.4–10.5)
Chloride: 104 mmol/L (ref 98–110)
Creatinine: 0.6 mg/dL (ref 0.5–1.2)
GFRAA: >60 (ref >60.0)
GFRNA: >60 (ref >60.0)
Globulin: 2.6 g/dL (ref 2.0–4.0)
Glucose: 90 mg/dL (ref 65–99)
Potassium: 4.6 mmol/L (ref 3.5–5.5)
Protein, total: 6.9 g/dL (ref 6.4–8.3)
Sodium: 139 mmol/L (ref 133–145)

## 2013-09-08 LAB — IRON PROFILE
Iron % saturation: 19 % — ABNORMAL LOW (ref 20–50)
Iron: 61 ug/dL (ref 30–160)
TIBC: 317 ug/dL (ref 228–428)
UIBC: 256 ug/dL (ref 110–370)

## 2013-09-08 LAB — FERRITIN: Ferritin: 98 ng/mL (ref 10–291)

## 2013-09-08 NOTE — Progress Notes (Signed)
Quick Note:        Results reviewed and noted.    ______

## 2013-10-03 NOTE — Telephone Encounter (Signed)
Pt is have arm pain. pls call

## 2013-10-03 NOTE — Telephone Encounter (Signed)
Returned  patient call no answer left voicemail for patient to call office back.

## 2013-10-06 MED ORDER — HYDROMORPHONE 2 MG TAB
2 mg | ORAL_TABLET | Freq: Four times a day (QID) | ORAL | Status: DC | PRN
Start: 2013-10-06 — End: 2014-01-09

## 2013-10-06 NOTE — Progress Notes (Signed)
Pt was seen in our office earlier this month for regular SS OV. This past weekend pt c/o Right Arm pain and was seen and treated in ER for probable arm pain 2' SS. Pt was given Rx for Dilauded  1 po Q6h prn. Pt states that she has been taking this with Motrin with good pain relief;however, she has now run out of dilauded and states that she has been taking her normal Percocet 5, increasing the dose to 2 po w/o good relief of pain.   Pt denies injury to arm, swelling or fever.  I have ordered Dilauded  for her to take Q6h prn and will schedule her to f/u with me in 2 weeks, sooner if needed.

## 2013-10-06 NOTE — Telephone Encounter (Addendum)
Pt went to ER for arm pain, they gave her motrin 800 and something else for the pain, she has taken all medication and pain is still there. Please call. Work # [phone] if pt does not answer cell

## 2013-11-03 MED ORDER — OXYCODONE-ACETAMINOPHEN 5 MG-325 MG TAB
5-325 mg | ORAL_TABLET | ORAL | Status: DC | PRN
Start: 2013-11-03 — End: 2014-01-09

## 2013-11-03 NOTE — Telephone Encounter (Signed)
Pt need refill

## 2013-11-09 ENCOUNTER — Inpatient Hospital Stay: Admit: 2013-11-09

## 2013-11-09 ENCOUNTER — Ambulatory Visit: Admit: 2013-11-09 | Discharge: 2013-11-09 | Payer: PRIVATE HEALTH INSURANCE | Attending: Hematology & Oncology

## 2013-11-09 DIAGNOSIS — D5700 Hb-SS disease with crisis, unspecified: Secondary | ICD-10-CM

## 2013-11-09 LAB — CBC WITH 3 PART DIFF
ABS. LYMPHOCYTES: 2.5 10*3/uL (ref 1.1–5.9)
ABS. MIXED CELLS: 0.5 10*3/uL (ref 0.0–2.3)
ABS. NEUTROPHILS: 4 10*3/uL (ref 1.8–9.5)
HCT: 34.5 % — ABNORMAL LOW (ref 36–48)
HGB: 11.8 g/dL — ABNORMAL LOW (ref 12.0–16.0)
LYMPHOCYTES: 36 % (ref 14–44)
MCH: 25.4 PG (ref 25.0–35.0)
MCHC: 34.2 g/dL (ref 31–37)
MCV: 74.2 FL — ABNORMAL LOW (ref 78–102)
Mixed cells: 7 % (ref 0.1–17)
NEUTROPHILS: 57 % (ref 40–70)
PLATELET: 229 10*3/uL (ref 140–440)
RBC: 4.65 M/uL (ref 4.10–5.10)
RDW: 16.2 % — ABNORMAL HIGH (ref 11.5–14.5)
WBC: 7 10*3/uL (ref 4.5–13.0)

## 2013-11-09 NOTE — Progress Notes (Signed)
Quick Note:        Results reviewed and noted.    ______

## 2013-11-09 NOTE — Progress Notes (Signed)
Hematology/Oncology  Progress Note    Name: Chelsea Webb  Date: 11/09/2013  DOB: August 26, 1980  Primary Care Provider: No primary care provider on file.        Chelsea Webb is a 33 year old female with sickle cell disease, anemia and frequent pain crisis.     Current Therapy: her current base pain medication, hydroxyurea 500 mg daily    Subjective:     Chelsea Webb is here today for fu ov. The patient reports that she has been doing well and has not had a vaso-occlusive pain crisis since her last clinic visit.  She has not required the use of her pain medication and therefore she has plenty of medication at home.  She will not need a new prescription filled at this time.  She has been continue to hydrate herself adequately using a combination of water and Gatorade and this appears to be working quite well.  She has no new complaints or concerns reported at this time.           The past medical, surgical and social history has been reviewed and remains unchanged.   Past Medical History   Diagnosis Date   ??? Chronic pain    ??? Anemia    ??? Musculoskeletal disorder      No past surgical history on file.  History     Social History   ??? Marital Status: SINGLE     Spouse Name: N/A     Number of Children: N/A   ??? Years of Education: N/A     Occupational History   ??? Not on file.     Social History Main Topics   ??? Smoking status: Never Smoker    ??? Smokeless tobacco: Not on file   ??? Alcohol Use: No   ??? Drug Use: No   ??? Sexual Activity: Yes      Comment: LMP 05/29/12     Other Topics Concern   ??? Not on file     Social History Narrative   ??? No narrative on file     Family History   Problem Relation Age of Onset   ??? Diabetes Father      Current Outpatient Prescriptions   Medication Sig Dispense Refill   ??? oxyCODONE-acetaminophen (PERCOCET) 5-325 mg per tablet Take 1 Tab by mouth every four (4) hours as needed for Pain. 90 Tab 0   ??? HYDROmorphone (DILAUDID) 2 mg tablet Take 1 Tab by mouth every six (6)  hours as needed for Pain. Max Daily Amount: 8 mg. 60 Tab 0   ??? hydroxyurea (HYDREA) 500 mg capsule Take 1 Cap by mouth daily. 30 Cap 2   ??? folic acid (FOLVITE) 1 mg tablet Take  by mouth daily.     ??? traMADol (ULTRAM) 50 mg tablet Take 1 tablet by mouth every eight (8) hours as needed for Pain. 90 tablet 2       Review of Systems  General :The patient has no complaints and there is no physical distress evident. She has generalized pain esp in the legs.   Psychological : patient denies having any psychological symptoms such as hallucinations depression or anxiety.   Ophthalmic:the patient denies having any visual impairment or eye discomfort.   ENT: there are no abnormalities reported.   Allergy and Immunology:the patient denies having any seasonal allergies or allergies to medications other than those already outlined above.   Hematological and Lymphatic: the patient denies having any bruising, bleeding or  lymphadenopathy.   Endocrine: the patient denies having any heat or cold intolerance. There is no history of diabetes or thyroid disorders.   Respiratory:the patient denies having any cough, shortness of breath, or dyspnea on exertion.   Cardiovascular: there are no complaints of chest pain, palpitations, chest pounding, or dyspnea on exertion. bp is elevated today  Gastrointestinal: the patient denies having nausea, emesis, diarrhea, constipation, or blood in the stool. Pain in the left upper quadrant. No nausea or vomiting.   Genito-Urinary: the patient denies having urinary urgency, frequency, or dysuria.   Musculoskeletal: pain in the bilateral knees, left >right.   Neurological:  denies having any numbness, tingling, or neurologic deficits.   Dermatological: patient denies having any unexplained rash, skin ulcerations, or hives.    Objective:   Ht 5\' 4"  (1.626 m)  Repeat BP was 128/86 left arm manual  Pain Score: 8/10    Physical Exam:   ECOG: na  General: Well appearing, in NAD, stating she has pain   Skin: examination of the skin reveals no bruising, rash or petechiae  HEENT: Normocephalic, atraumatic. Conjunctiva and sclera are clear. Pupils are equal, round and reactive to light. EOMs are intact. ENT without oral mucosal lesions, stomatitis or thrush  Neck: supple without lymphadenopathy, JVD or thyromegaly  Lymphatics: no palpable cervical, supraclavicular, axillary or inguinal lymphadenopathy  Lungs: clear breath sounds bilaterally, no rhonchi or wheezes noted  Heart: Regular rate and rhythm, no murmurs, rubs or gallops, S1-S2 noted. Positive peripheral pulses bilaterally upper and lower extremities  Abdomen: soft, tender LUQ to palpation. No splenomegaly, non-distended, no HSM, positive bowel sounds  Extremities: without clubbing, cyanosis or edema, no point tenderness.   Neurologic: no focal deficits, steady gait, Alert and oriented x 3.  Psychologic: mood and affect are appropriate, no anxiety or depression noted    Laboratory Data:     Results for orders placed or performed during the hospital encounter of 11/09/13   CBC WITH 3 PART DIFF     Status: Abnormal   Result Value Ref Range Status    WBC 7.0 4.5 - 13.0 K/uL Final    RBC 4.65 4.10 - 5.10 M/uL Final    HGB 11.8 (L) 12.0 - 16.0 g/dL Final    HCT 16.1 (L) 36 - 48 % Final    MCV 74.2 (L) 78 - 102 FL Final    MCH 25.4 25.0 - 35.0 PG Final    MCHC 34.2 31 - 37 g/dL Final    RDW 09.6 (H) 04.5 - 14.5 % Final    PLATELET 229 140 - 440 K/uL Final    NEUTROPHILS 57 40 - 70 % Final    MIXED CELLS 7 0.1 - 17 % Final    LYMPHOCYTES 36 14 - 44 % Final    ABS. NEUTROPHILS 4.0 1.8 - 9.5 K/UL Final    ABS. MIXED CELLS 0.5 0.0 - 2.3 K/uL Final    ABS. LYMPHOCYTES 2.5 1.1 - 5.9 K/UL Final     Comment: Test Performed by [facility]. Results reviewed by Medical Director.    DF AUTOMATED   Final            Patient Active Problem List Diagnosis Code   ??? Sickle cell disease (HCC) D57.1   ??? Sickle cell anemia (HCC) D57.1         Assessment:      1. Sickle cell anemia, with unspecified crisis (HCC)    2. Sickle cell disease, with unspecified  crisis [facility](HCC)      Plan:   Sickle cell disease: clinically, the patient is doing well.  She continues to use Percocet 5 mg on an as needed basis for pain control.  She has hydrating herself adequately with a combination of water and Gatorade.  She has no new complaints or concerns to report currently.  I will recheck her comprehensive metabolic panel, iron profile, and ferritin level at this time.  Followup will occur in 2 months.        Anemia: the current CBC shows a hemoglobin is 11.8 g/dL and hematocrit is 16.1%34.5%.  I will check iron profile and ferritin level.  As long as the ferritin level stays below 1000 she will not be required to go on therapy with Xjade.    Thrombocytopenia: resolved, her current platelet count is 229,000.     Total time 40 min. Greater than 50% of the time was in counseling and coordination of care. I will schedule the patient for a followup visit in 2 months.      Follow-up Disposition:  Return in about 2 months (around 01/09/2014).   Orders Placed This Encounter   ??? COMPLETE CBC & AUTO DIFF WBC   ??? InHouse CBC ([facility])     Standing Status: Future      Number of Occurrences: 1      Standing Expiration Date: 11/16/2013   ??? METABOLIC PANEL, COMPREHENSIVE   ??? IRON PROFILE   ??? FERRITIN       Kennon HolterLLOYD A Magin Balbi, MD  11/09/2013

## 2013-11-10 LAB — METABOLIC PANEL, COMPREHENSIVE
A-G Ratio: 1.4 ratio (ref 1.1–2.6)
ALT (SGPT): 14 U/L (ref 5–40)
AST (SGOT): 11 U/L (ref 10–37)
Albumin: 3.9 g/dL (ref 3.5–5.0)
Alk. phosphatase: 82 U/L (ref 25–115)
Anion gap: 11 mmol/L
BUN: 6 mg/dL (ref 6–22)
Bilirubin, total: 0.5 mg/dL (ref 0.2–1.2)
CO2: 26 mmol/L (ref 20–32)
Calcium: 9.3 mg/dL (ref 8.4–10.5)
Chloride: 102 mmol/L (ref 98–110)
Creatinine: 0.5 mg/dL (ref 0.5–1.2)
GFRAA: >60 (ref >60.0)
GFRNA: >60 (ref >60.0)
Globulin: 2.7 g/dL (ref 2.0–4.0)
Glucose: 86 mg/dL (ref 65–99)
Potassium: 4.6 mmol/L (ref 3.5–5.5)
Protein, total: 6.6 g/dL (ref 6.4–8.3)
Sodium: 139 mmol/L (ref 133–145)

## 2013-11-10 LAB — IRON PROFILE
Iron % saturation: 21 % (ref 20–50)
Iron: 65 ug/dL (ref 30–160)
TIBC: 314 ug/dL (ref 228–428)
UIBC: 249 ug/dL (ref 110–370)

## 2013-11-10 LAB — FERRITIN: Ferritin: 61 ng/mL (ref 10–291)

## 2013-11-10 NOTE — Progress Notes (Signed)
Quick Note:        Results reviewed and noted.    ______

## 2014-01-09 ENCOUNTER — Inpatient Hospital Stay: Admit: 2014-01-09

## 2014-01-09 ENCOUNTER — Ambulatory Visit: Admit: 2014-01-09 | Discharge: 2014-01-09 | Payer: PRIVATE HEALTH INSURANCE | Attending: Hematology & Oncology

## 2014-01-09 DIAGNOSIS — D571 Sickle-cell disease without crisis: Secondary | ICD-10-CM

## 2014-01-09 LAB — CBC WITH 3 PART DIFF
ABS. LYMPHOCYTES: 2.3 10*3/uL (ref 1.1–5.9)
ABS. MIXED CELLS: 0.7 10*3/uL (ref 0.0–2.3)
ABS. NEUTROPHILS: 5.4 10*3/uL (ref 1.8–9.5)
HCT: 34.3 % — ABNORMAL LOW (ref 36–48)
HGB: 11.4 g/dL — ABNORMAL LOW (ref 12.0–16.0)
LYMPHOCYTES: 27 % (ref 14–44)
MCH: 24.4 PG — ABNORMAL LOW (ref 25.0–35.0)
MCHC: 33.2 g/dL (ref 31–37)
MCV: 73.3 FL — ABNORMAL LOW (ref 78–102)
Mixed cells: 8 % (ref 0.1–17)
NEUTROPHILS: 65 % (ref 40–70)
PLATELET: 217 10*3/uL (ref 140–440)
RBC: 4.68 M/uL (ref 4.10–5.10)
RDW: 16.4 % — ABNORMAL HIGH (ref 11.5–14.5)
WBC: 8.4 10*3/uL (ref 4.5–13.0)

## 2014-01-09 MED ORDER — HYDROMORPHONE 2 MG TAB
2 mg | ORAL_TABLET | Freq: Four times a day (QID) | ORAL | Status: DC | PRN
Start: 2014-01-09 — End: 2014-03-13

## 2014-01-09 MED ORDER — OXYCODONE-ACETAMINOPHEN 5 MG-325 MG TAB
5-325 mg | ORAL_TABLET | ORAL | Status: AC | PRN
Start: 2014-01-09 — End: ?

## 2014-01-09 MED ORDER — IBUPROFEN 800 MG TAB
800 mg | ORAL_TABLET | Freq: Four times a day (QID) | ORAL | Status: DC | PRN
Start: 2014-01-09 — End: 2014-03-13

## 2014-01-09 MED ORDER — FOLIC ACID 1 MG TAB
1 mg | ORAL_TABLET | Freq: Every day | ORAL | Status: AC
Start: 2014-01-09 — End: ?

## 2014-01-09 NOTE — Progress Notes (Signed)
Hematology/Oncology  Progress Note    Name: Chelsea Webb  Date: 01/09/2014  DOB: [birth year]  Primary Care Provider: No primary care provider on file.        Chelsea Webb is a 33 year old female with sickle cell disease, anemia and frequent pain crisis.     Current Therapy: her current base pain medication, hydroxyurea 500 mg daily folic acid 1mg  daily.    Subjective:     Chelsea Webb is here today for fu ov. Today the patient complains of severe pain in her left arm x3 days. She states she has been taking Motrin 800mg  because she cant take the narcotic-based pain meds during the day because she has to work. She is asking for a prescription for Motrin 800mg , as well as her Dilaudid and Percocet. She also needs a refill on folic acid. The patient also complains of occasional abdominal muscle spasms. She has been continue to hydrate herself adequately using a combination of water and Gatorade and this appears to be working quite well.     The past medical, surgical and social history has been reviewed and remains unchanged.   Past Medical History   Diagnosis Date   ??? Chronic pain    ??? Anemia    ??? Musculoskeletal disorder      No past surgical history on file.  History     Social History   ??? Marital Status: SINGLE     Spouse Name: N/A     Number of Children: N/A   ??? Years of Education: N/A     Occupational History   ??? Not on file.     Social History Main Topics   ??? Smoking status: Never Smoker    ??? Smokeless tobacco: Not on file   ??? Alcohol Use: No   ??? Drug Use: No   ??? Sexual Activity: Yes      Comment: LMP 05/29/12     Other Topics Concern   ??? Not on file     Social History Narrative   ??? No narrative on file     Family History   Problem Relation Age of Onset   ??? Diabetes Father      Current Outpatient Prescriptions   Medication Sig Dispense Refill   ??? oxyCODONE-acetaminophen (PERCOCET) 5-325 mg per tablet Take 1 Tab by mouth every four (4) hours as needed for Pain. 90 Tab 0    ??? HYDROmorphone (DILAUDID) 2 mg tablet Take 1 Tab by mouth every six (6) hours as needed for Pain. Max Daily Amount: 8 mg. 60 Tab 0   ??? ibuprofen (MOTRIN) 800 mg tablet Take 1 Tab by mouth every six (6) hours as needed for Pain. 120 Tab 0   ??? folic acid (FOLVITE) 1 mg tablet Take 1 Tab by mouth daily. 90 Tab 3   ??? hydroxyurea (HYDREA) 500 mg capsule Take 1 Cap by mouth daily. 30 Cap 2   ??? traMADol (ULTRAM) 50 mg tablet Take 1 tablet by mouth every eight (8) hours as needed for Pain. 90 tablet 2       Review of Systems  General :The patient complains of pain and abdominal muscle spams. She has generalized pain esp in the LUE.   Psychological : patient denies having any psychological symptoms such as hallucinations depression or anxiety.   Ophthalmic:the patient denies having any visual impairment or eye discomfort.   ENT: there are no abnormalities reported.   Allergy and Immunology:the patient denies having any  seasonal allergies or allergies to medications other than those already outlined above.   Hematological and Lymphatic: the patient denies having any bruising, bleeding or lymphadenopathy.   Endocrine: the patient denies having any heat or cold intolerance. There is no history of diabetes or thyroid disorders.   Respiratory:the patient denies having any cough, shortness of breath, or dyspnea on exertion.   Cardiovascular: there are no complaints of chest pain, palpitations, chest pounding, or dyspnea on exertion. bp is elevated today  Gastrointestinal: the patient denies having nausea, emesis, diarrhea, constipation, or blood in the stool. No nausea or vomiting.  C/O occasional muscle spasms.  Genito-Urinary: the patient denies having urinary urgency, frequency, or dysuria.   Musculoskeletal: pain in the bilateral knees, left >right.   Neurological:  denies having any numbness, tingling, or neurologic deficits.   Dermatological: patient denies having any unexplained rash, skin ulcerations, or hives.     Objective:   BP 145/92 mmHg   Pulse 113   Temp(Src) 98.9 ??F (37.2 ??C) (Oral)   Ht 5\' 4"  (1.626 m)   Wt 136.986 kg (302 lb)   BMI 51.81 kg/m2  Repeat BP was 128/86 left arm manual  Pain Score: 8/10    Physical Exam:   ECOG: na  General: Well appearing, in NAD, stating she has pain  Skin: examination of the skin reveals no bruising, rash or petechiae  HEENT: Normocephalic, atraumatic. Conjunctiva and sclera are clear. Pupils are equal, round and reactive to light. EOMs are intact. ENT without oral mucosal lesions, stomatitis or thrush  Neck: supple without lymphadenopathy, JVD or thyromegaly  Lymphatics: no palpable cervical, supraclavicular, axillary or inguinal lymphadenopathy  Lungs: clear breath sounds bilaterally, no rhonchi or wheezes noted  Heart: Regular rate and rhythm, no murmurs, rubs or gallops, S1-S2 noted. Positive peripheral pulses bilaterally upper and lower extremities  Abdomen: soft, tender LUQ to palpation. No splenomegaly, non-distended, no HSM, positive bowel sounds  Extremities: without clubbing, cyanosis or edema, no point tenderness.   Neurologic: no focal deficits, steady gait, Alert and oriented x 3.  Psychologic: mood and affect are appropriate, no anxiety or depression noted    Laboratory Data:     Results for orders placed or performed during the hospital encounter of 01/09/14   CBC WITH 3 PART DIFF     Status: Abnormal   Result Value Ref Range Status    WBC 8.4 4.5 - 13.0 K/uL Final    RBC 4.68 4.10 - 5.10 M/uL Final    HGB 11.4 (L) 12.0 - 16.0 g/dL Final    HCT 19.134.3 (L) 36 - 48 % Final    MCV 73.3 (L) 78 - 102 FL Final    MCH 24.4 (L) 25.0 - 35.0 PG Final    MCHC 33.2 31 - 37 g/dL Final    RDW 47.816.4 (H) 29.511.5 - 14.5 % Final    PLATELET 217 140 - 440 K/uL Final    NEUTROPHILS 65 40 - 70 % Final    MIXED CELLS 8 0.1 - 17 % Final    LYMPHOCYTES 27 14 - 44 % Final    ABS. NEUTROPHILS 5.4 1.8 - 9.5 K/UL Final    ABS. MIXED CELLS 0.7 0.0 - 2.3 K/uL Final     ABS. LYMPHOCYTES 2.3 1.1 - 5.9 K/UL Final     Comment: Test Performed by [facility]. Results reviewed by Medical Director.  DF AUTOMATED   Final            Patient  Active Problem List   Diagnosis Code   ??? Sickle cell disease (HCC) D57.1   ??? Sickle cell anemia (HCC) D57.1         Assessment:     1. Sickle cell anemia, without crisis (HCC)    2. Vitamin B12 deficiency    3. Sickle cell disease, with unspecified crisis (HCC)      Plan:   Sickle cell disease with vaso-occlusive pain crisis: The patient complains of severe pain in LUE x3 days. She also has pain to BLE. A prescription for Motril 800mg  will be provided with instructions to take 1 tab every 6 hrs as needed for pain. A prescription for Percocet and Dilaudid will also be provided to the patient. She has hydrating herself adequately with a combination of water and Gatorade.   I will recheck her comprehensive metabolic panel, iron profile, and ferritin level at this time. A reticulocyte count will also be checked at this time.  Followup will occur in 2 months.        Sickle Cell Anemia: the current CBC shows a hemoglobin is 11.4 g/dL and hematocrit is 16.1%.  I will check iron profile and ferritin level.  As long as the ferritin level stays below 1000 she will not be required to go on therapy with Xjade.    Vitamin B12 deficiency: The patient was shortly to resume taking over-the-counter B12 tablets daily.  She is also continuing to take the 1 mg of folic acid daily as well.      I will schedule the patient for a followup visit in 2 months.      Follow-up Disposition:  Return in about 2 months (around 03/12/2014).   Orders Placed This Encounter   ??? COMPLETE CBC & AUTO DIFF WBC   ??? InHouse CBC ([facility])     Standing Status: Future      Number of Occurrences: 1      Standing Expiration Date: 01/16/2014   ??? FERRITIN   ??? IRON PROFILE   ??? METABOLIC PANEL, COMPREHENSIVE   ??? VITAMIN B12 & FOLATE   ??? RETICULOCYTE COUNT    ??? oxyCODONE-acetaminophen (PERCOCET) 5-325 mg per tablet     Sig: Take 1 Tab by mouth every four (4) hours as needed for Pain.     Dispense:  90 Tab     Refill:  0   ??? HYDROmorphone (DILAUDID) 2 mg tablet     Sig: Take 1 Tab by mouth every six (6) hours as needed for Pain. Max Daily Amount: 8 mg.     Dispense:  60 Tab     Refill:  0   ??? ibuprofen (MOTRIN) 800 mg tablet     Sig: Take 1 Tab by mouth every six (6) hours as needed for Pain.     Dispense:  120 Tab     Refill:  0   ??? folic acid (FOLVITE) 1 mg tablet     Sig: Take 1 Tab by mouth daily.     Dispense:  90 Tab     Refill:  3       LLOYD A Alanda Slim, MD  01/09/2014

## 2014-01-10 LAB — VITAMIN B12 & FOLATE
Folate: 8.93 ng/mL (ref >=3.10)
Vitamin B12: 198 pg/mL — ABNORMAL LOW (ref 211–911)

## 2014-01-10 LAB — METABOLIC PANEL, COMPREHENSIVE
A-G Ratio: 1.5 ratio (ref 1.1–2.6)
ALT (SGPT): 16 U/L (ref 5–40)
AST (SGOT): 14 U/L (ref 10–37)
Albumin: 4.1 g/dL (ref 3.5–5.0)
Alk. phosphatase: 77 U/L (ref 25–115)
Anion gap: 18 mmol/L
BUN: 8 mg/dL (ref 6–22)
Bilirubin, total: 0.6 mg/dL (ref 0.2–1.2)
CO2: 23 mmol/L (ref 20–32)
Calcium: 9.7 mg/dL (ref 8.4–10.5)
Chloride: 101 mmol/L (ref 98–110)
Creatinine: 0.4 mg/dL — ABNORMAL LOW (ref 0.5–1.2)
GFRAA: >60 (ref >60.0)
GFRNA: >60 (ref >60.0)
Globulin: 2.8 g/dL (ref 2.0–4.0)
Glucose: 84 mg/dL (ref 65–99)
Potassium: 4.3 mmol/L (ref 3.5–5.5)
Protein, total: 6.9 g/dL (ref 6.4–8.3)
Sodium: 142 mmol/L (ref 133–145)

## 2014-01-10 LAB — IRON PROFILE
Iron % saturation: 22 % (ref 20–50)
Iron: 66 ug/dL (ref 30–160)
TIBC: 305 ug/dL (ref 228–428)
UIBC: 239 ug/dL (ref 110–370)

## 2014-01-10 LAB — RETICULOCYTE COUNT: Reticulocyte count: 3.7 % — ABNORMAL HIGH (ref 0.5–2.0)

## 2014-01-10 LAB — FERRITIN: Ferritin: 84 ng/mL (ref 10–291)

## 2014-01-11 NOTE — Telephone Encounter (Signed)
Pt lf mess.. Checking the status of lab work. Please call

## 2014-03-13 ENCOUNTER — Inpatient Hospital Stay: Admit: 2014-03-13

## 2014-03-13 ENCOUNTER — Ambulatory Visit: Admit: 2014-03-13 | Discharge: 2014-03-13 | Payer: PRIVATE HEALTH INSURANCE | Attending: Hematology & Oncology

## 2014-03-13 DIAGNOSIS — D571 Sickle-cell disease without crisis: Secondary | ICD-10-CM

## 2014-03-13 LAB — CBC WITH 3 PART DIFF
ABS. LYMPHOCYTES: 2.2 10*3/uL (ref 1.1–5.9)
ABS. MIXED CELLS: 0.8 10*3/uL (ref 0.0–2.3)
ABS. NEUTROPHILS: 4.4 10*3/uL (ref 1.8–9.5)
HCT: 34.7 % — ABNORMAL LOW (ref 36–48)
HGB: 11.4 g/dL — ABNORMAL LOW (ref 12.0–16.0)
LYMPHOCYTES: 29 % (ref 14–44)
MCH: 23.5 PG — ABNORMAL LOW (ref 25.0–35.0)
MCHC: 32.9 g/dL (ref 31–37)
MCV: 71.5 FL — ABNORMAL LOW (ref 78–102)
Mixed cells: 10 % (ref 0.1–17)
NEUTROPHILS: 61 % (ref 40–70)
RBC: 4.85 M/uL (ref 4.10–5.10)
RDW: 16.9 % — ABNORMAL HIGH (ref 11.5–14.5)
WBC: 7.4 10*3/uL (ref 4.5–13.0)

## 2014-03-13 MED ORDER — IBUPROFEN 800 MG TAB
800 mg | ORAL_TABLET | Freq: Four times a day (QID) | ORAL | Status: AC | PRN
Start: 2014-03-13 — End: ?

## 2014-03-13 MED ORDER — HYDROMORPHONE 2 MG TAB
2 mg | ORAL_TABLET | Freq: Four times a day (QID) | ORAL | Status: AC | PRN
Start: 2014-03-13 — End: ?

## 2014-03-13 NOTE — Progress Notes (Signed)
Hematology/Oncology  Progress Note    Name: Chelsea Webb  Date: 03/13/2014  DOB: [birth year]  Primary Care Provider: No primary care provider on file.        Chelsea Webb is a 34 year old female with sickle cell disease, anemia and frequent pain crisis.     Current Therapy: her current base pain medication, hydroxyurea 500 mg daily folic acid  daily.    Subjective:     Chelsea Webb is here today for fu ov. Today the patient complains of severe pain in her left arm x3 days. She states she has been taking Motrin  because she cant take the narcotic-based pain meds during the day because she has to work. She is asking for a prescription for Motrin , as well as her Dilaudid and Percocet. She also needs a refill on folic acid. The patient also complains of occasional abdominal muscle spasms. She has been continue to hydrate herself adequately using a combination of water and Gatorade and this appears to be working quite well.     The past medical, surgical and social history has been reviewed and remains unchanged.   Past Medical History   Diagnosis Date   ??? Chronic pain    ??? Anemia    ??? Musculoskeletal disorder      No past surgical history on file.  History     Social History   ??? Marital Status: SINGLE     Spouse Name: N/A   ??? Number of Children: N/A   ??? Years of Education: N/A     Occupational History   ??? Not on file.     Social History Main Topics   ??? Smoking status: Never Smoker    ??? Smokeless tobacco: Not on file   ??? Alcohol Use: No   ??? Drug Use: No   ??? Sexual Activity: Yes      Comment: LMP 05/29/12     Other Topics Concern   ??? Not on file     Social History Narrative   ??? No narrative on file     Family History   Problem Relation Age of Onset   ??? Diabetes Father      Current Outpatient Prescriptions   Medication Sig Dispense Refill   ??? HYDROmorphone (DILAUDID) 2 mg tablet Take 1 Tab by mouth every six (6) hours as needed for Pain. Max Daily Amount: 8 mg. 60 Tab 0    ??? ibuprofen (MOTRIN) 800 mg tablet Take 1 Tab by mouth every six (6) hours as needed for Pain. 120 Tab 0   ??? oxyCODONE-acetaminophen (PERCOCET) 5-325 mg per tablet Take 1 Tab by mouth every four (4) hours as needed for Pain. 90 Tab 0   ??? folic acid (FOLVITE) 1 mg tablet Take 1 Tab by mouth daily. 90 Tab 3   ??? hydroxyurea (HYDREA) 500 mg capsule Take 1 Cap by mouth daily. 30 Cap 2   ??? traMADol (ULTRAM) 50 mg tablet Take 1 tablet by mouth every eight (8) hours as needed for Pain. 90 tablet 2       Review of Systems  General :The patient complains of pain and abdominal muscle spams. She has generalized pain esp in the LUE.   Psychological : patient denies having any psychological symptoms such as hallucinations depression or anxiety.   Ophthalmic:the patient denies having any visual impairment or eye discomfort.   ENT: there are no abnormalities reported.   Allergy and Immunology:the patient denies having any seasonal  allergies or allergies to medications other than those already outlined above.   Hematological and Lymphatic: the patient denies having any bruising, bleeding or lymphadenopathy.   Endocrine: the patient denies having any heat or cold intolerance. There is no history of diabetes or thyroid disorders.   Respiratory:the patient denies having any cough, shortness of breath, or dyspnea on exertion.   Cardiovascular: there are no complaints of chest pain, palpitations, chest pounding, or dyspnea on exertion. bp is elevated today  Gastrointestinal: the patient denies having nausea, emesis, diarrhea, constipation, or blood in the stool. No nausea or vomiting.  C/O occasional muscle spasms.  Genito-Urinary: the patient denies having urinary urgency, frequency, or dysuria.   Musculoskeletal: pain in the bilateral knees, left >right.   Neurological:  denies having any numbness, tingling, or neurologic deficits.   Dermatological: patient denies having any unexplained rash, skin ulcerations, or hives.    Objective:    BP 123/74 mmHg   Pulse 87   Temp(Src) 98.4 ??F (36.9 ??C) (Oral)   Ht 5\' 4"  (1.626 m)   Wt 134.718 kg (297 lb)   BMI 50.95 kg/m2  Repeat BP was 128/86 left arm manual  Pain Score: 8/10    Physical Exam:   ECOG: na  General: Well appearing, in NAD, stating she has pain  Skin: examination of the skin reveals no bruising, rash or petechiae  HEENT: Normocephalic, atraumatic. Conjunctiva and sclera are clear. Pupils are equal, round and reactive to light. EOMs are intact. ENT without oral mucosal lesions, stomatitis or thrush  Neck: supple without lymphadenopathy, JVD or thyromegaly  Lymphatics: no palpable cervical, supraclavicular, axillary or inguinal lymphadenopathy  Lungs: clear breath sounds bilaterally, no rhonchi or wheezes noted  Heart: Regular rate and rhythm, no murmurs, rubs or gallops, S1-S2 noted. Positive peripheral pulses bilaterally upper and lower extremities  Abdomen: soft, tender LUQ to palpation. No splenomegaly, non-distended, no HSM, positive bowel sounds  Extremities: without clubbing, cyanosis or edema, no point tenderness.   Neurologic: no focal deficits, steady gait, Alert and oriented x 3.  Psychologic: mood and affect are appropriate, no anxiety or depression noted    Laboratory Data:     Results for orders placed or performed during the hospital encounter of 03/13/14   CBC WITH 3 PART DIFF     Status: Abnormal   Result Value Ref Range Status    WBC 7.4 4.5 - 13.0 K/uL Final    RBC 4.85 4.10 - 5.10 M/uL Final    HGB 11.4 (L) 12.0 - 16.0 g/dL Final    HCT 54.034.7 (L) 36 - 48 % Final    MCV 71.5 (L) 78 - 102 FL Final    MCH 23.5 (L) 25.0 - 35.0 PG Final    MCHC 32.9 31 - 37 g/dL Final    RDW 98.116.9 (H) 19.111.5 - 14.5 % Final    NEUTROPHILS 61 40 - 70 % Final    MIXED CELLS 10 0.1 - 17 % Final    LYMPHOCYTES 29 14 - 44 % Final    ABS. NEUTROPHILS 4.4 1.8 - 9.5 K/UL Final    ABS. MIXED CELLS 0.8 0.0 - 2.3 K/uL Final    ABS. LYMPHOCYTES 2.2 1.1 - 5.9 K/UL Final      Comment: Test performed [facility]. Results Reviewed by Medical Director.    DF AUTOMATED   Final            Patient Active Problem List   Diagnosis Code   ???  Sickle cell disease (HCC) D57.1   ??? Sickle cell anemia (HCC) D57.1   ??? Chronic pain disorder G89.4   ??? 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 B12 deficiency E53.8         Assessment:     1. Hb-SS disease without crisis (HCC)    2. Chronic pain disorder    3. Hb-SS disease with crisis (HCC)    4. B12 deficiency      Plan:   Sickle cell disease with vaso-occlusive pain crisis and chronic pain disorder: The patient complains of severe pain in LUE x3 days. She also has pain to BLE. A prescription for Motril  will be provided with instructions to take 1 tab every 6 hrs as needed for pain. A prescription for Percocet and Dilaudid will also be provided to the patient. She has hydrating herself adequately with a combination of water and Gatorade.   I will recheck her comprehensive metabolic panel, iron profile, and ferritin level at this time. A reticulocyte count will also be checked at this time.  Followup will occur in 2 months.        Sickle Cell Anemia: the current CBC shows a hemoglobin is 11.4 g/dL and hematocrit is 16.1%.  I will check iron profile and ferritin level.  As long as the ferritin level stays below 1000 she will not be required to go on therapy with Xjade.    Vitamin B12 deficiency: The patient was shortly to resume taking over-the-counter B12 tablets daily.  She is also continuing to take the 1 mg of folic acid daily as well.      I will schedule the patient for a followup visit in 2 months.      Follow-up Disposition:  Return in about 2 months (around 05/12/2014).   Orders Placed This Encounter   ??? COMPLETE CBC & AUTO DIFF WBC   ??? InHouse CBC ([facility])     Standing Status: Future      Number of Occurrences: 1      Standing Expiration Date: 03/20/2014   ??? VITAMIN B12 & FOLATE   ??? IRON PROFILE   ??? FERRITIN    ??? METABOLIC PANEL, COMPREHENSIVE   ??? HYDROmorphone (DILAUDID) 2 mg tablet     Sig: Take 1 Tab by mouth every six (6) hours as needed for Pain. Max Daily Amount: 8 mg.     Dispense:  60 Tab     Refill:  0   ??? ibuprofen (MOTRIN) 800 mg tablet     Sig: Take 1 Tab by mouth every six (6) hours as needed for Pain.     Dispense:  120 Tab     Refill:  0       Kennon Holter, MD  03/13/2014

## 2014-03-14 LAB — METABOLIC PANEL, COMPREHENSIVE
A-G Ratio: 1.5 ratio (ref 1.1–2.6)
ALT (SGPT): 15 U/L (ref 5–40)
AST (SGOT): 12 U/L (ref 10–37)
Albumin: 4.3 g/dL (ref 3.5–5.0)
Alk. phosphatase: 81 U/L (ref 25–115)
Anion gap: 13 mmol/L
BUN: 7 mg/dL (ref 6–22)
Bilirubin, total: 0.6 mg/dL (ref 0.2–1.2)
CO2: 25 mmol/L (ref 20–32)
Calcium: 9.3 mg/dL (ref 8.4–10.5)
Chloride: 103 mmol/L (ref 98–110)
Creatinine: 0.5 mg/dL (ref 0.5–1.2)
GFRAA: >60 (ref >60.0)
GFRNA: >60 (ref >60.0)
Globulin: 2.9 g/dL (ref 2.0–4.0)
Glucose: 79 mg/dL (ref 65–99)
Potassium: 4.3 mmol/L (ref 3.5–5.5)
Protein, total: 7.2 g/dL (ref 6.4–8.3)
Sodium: 141 mmol/L (ref 133–145)

## 2014-03-14 LAB — VITAMIN B12 & FOLATE
Folate: 11.49 ng/mL (ref >=3.10)
Vitamin B12: 241 pg/mL (ref 211–911)

## 2014-03-14 LAB — IRON PROFILE
Iron % saturation: 20 % (ref 20–50)
Iron: 65 ug/dL (ref 30–160)
TIBC: 325 ug/dL (ref 228–428)
UIBC: 260 ug/dL (ref 110–370)

## 2014-03-14 LAB — PLATELET COUNT: PLATELET: 224 10*3/uL (ref 135–420)

## 2014-03-14 LAB — FERRITIN: Ferritin: 82 ng/mL (ref 10–291)

## 2014-03-14 NOTE — Progress Notes (Signed)
Quick Note:        Labs reviewed and noted. Will continue to monitor.    ______

## 2014-05-10 NOTE — Telephone Encounter (Signed)
[place]. Please call

## 2014-05-15 ENCOUNTER — Encounter: Attending: Hematology & Oncology

## 2014-08-29 DIAGNOSIS — K5903 Drug induced constipation: Secondary | ICD-10-CM | POA: Insufficient documentation

## 2017-08-22 DIAGNOSIS — D57219 Sickle-cell/Hb-C disease with crisis, unspecified: Secondary | ICD-10-CM | POA: Diagnosis not present

## 2017-09-11 ENCOUNTER — Ambulatory Visit (INDEPENDENT_AMBULATORY_CARE_PROVIDER_SITE_OTHER): Payer: Self-pay | Admitting: Family Medicine

## 2017-09-11 ENCOUNTER — Encounter: Payer: Self-pay | Admitting: Family Medicine

## 2017-09-11 VITALS — BP 142/90 | HR 84 | Temp 98.1°F | Ht 65.0 in | Wt 274.0 lb

## 2017-09-11 DIAGNOSIS — M62838 Other muscle spasm: Secondary | ICD-10-CM

## 2017-09-11 DIAGNOSIS — Z0000 Encounter for general adult medical examination without abnormal findings: Secondary | ICD-10-CM

## 2017-09-11 DIAGNOSIS — Z09 Encounter for follow-up examination after completed treatment for conditions other than malignant neoplasm: Secondary | ICD-10-CM

## 2017-09-11 DIAGNOSIS — D571 Sickle-cell disease without crisis: Secondary | ICD-10-CM

## 2017-09-11 LAB — POCT URINALYSIS DIP (MANUAL ENTRY)
Bilirubin, UA: NEGATIVE
Glucose, UA: NEGATIVE mg/dL
Ketones, POC UA: NEGATIVE mg/dL
Leukocytes, UA: NEGATIVE
Nitrite, UA: NEGATIVE
Protein Ur, POC: 30 mg/dL — AB
Spec Grav, UA: 1.015 (ref 1.010–1.025)
Urobilinogen, UA: 0.2 E.U./dL
pH, UA: 5.5 (ref 5.0–8.0)

## 2017-09-11 LAB — POCT GLYCOSYLATED HEMOGLOBIN (HGB A1C): Hemoglobin A1C: 4.1 % (ref 4.0–5.6)

## 2017-09-11 MED ORDER — OXYCODONE HCL 5 MG PO TABS: ORAL_TABLET | ORAL | 0 refills | Status: DC

## 2017-09-11 MED ORDER — FOLIC ACID 1 MG PO TABS: 1.0000 mg | ORAL_TABLET | Freq: Every day | ORAL | 11 refills | Status: DC

## 2017-09-11 MED ORDER — TIZANIDINE HCL 4 MG PO TABS: 4.0000 mg | ORAL_TABLET | Freq: Three times a day (TID) | ORAL | 2 refills | Status: DC | PRN

## 2017-09-11 MED FILL — tiZANidine HCL 4 MG TABS: 4 | 30 days supply | Qty: 90 | Fill #0

## 2017-09-11 MED FILL — FOLIC ACID 1 MG TABS: 1 | 30 days supply | Qty: 30 | Fill #0

## 2017-09-11 MED FILL — oxyCODONE HCL 5 MG TABS: 5 | 15 days supply | Qty: 90 | Fill #0

## 2017-09-11 NOTE — Progress Notes (Signed)
New Sickle Cell Patient-Establish Care   Subjective:    Patient ID: Stephanie Burch, female    DOB: [birth year], 37 y.o.   MRN: [id number]   Chief Complaint  Patient presents with  . Establish Care    sickle cell    HPI  Stephanie Burch has a past medical history of Sickle Cell Anemia. She is here today to establish care.   Current Status: She is has recently having more pain crisis as she has began to get older. She was a previously seen by Dr. Newman Piesavid Leedy at [facility]. Afterwards, she began seeing Dr. Mechele CollinElliott at [facility], who eventually referred her to Pain Management and she has discontinued follow up there. She lives in [place].  She states that Tizanidine is very effective, but she takes it more often then prescribed. She states that in the past, Hydrea was not effective for her. She usually has pain in her legs, arms, back, and new area of pain left flank area.   Since her last office visit, she is doing well with no complaints.   She denies fevers, chills, fatigue, recent infections, weight loss, and night sweats.   She has not had any headaches, visual changes, dizziness, and falls.   No chest pain, heart palpitations, cough and shortness of breath reported.   No reports of GI problems such as nausea, vomiting, diarrhea, and constipation. She has no reports of blood in stools, dysuria and hematuria. No depression or anxiety reported.  She denies pain today.   Past Medical History:  Diagnosis Date  . Sickle cell anemia (HCC)     Family History  Problem Relation Age of Onset  . Hypertension Mother   . Sickle cell trait Mother   . Diabetes Father   . Sickle cell trait Father     Social History   Socioeconomic History  . Marital status: Single    Spouse name: Not on file  . Number of children: Not on file  . Years of education: Not on file  . Highest education level: Not on file  Occupational History  . Not on file  Social Needs  . Financial resource  strain: Not on file  . Food insecurity:    Worry: Not on file    Inability: Not on file  . Transportation needs:    Medical: Not on file    Non-medical: Not on file  Tobacco Use  . Smoking status: Never Smoker  . Smokeless tobacco: Never Used  Substance and Sexual Activity  . Alcohol use: Never    Frequency: Never  . Drug use: Never  . Sexual activity: Yes    Birth control/protection: None  Lifestyle  . Physical activity:    Days per week: Not on file    Minutes per session: Not on file  . Stress: Not on file  Relationships  . Social connections:    Talks on phone: Not on file    Gets together: Not on file    Attends religious service: Not on file    Active member of club or organization: Not on file    Attends meetings of clubs or organizations: Not on file    Relationship status: Not on file  . Intimate partner violence:    Fear of current or ex partner: Not on file    Emotionally abused: Not on file    Physically abused: Not on file    Forced sexual activity: Not on file  Other Topics Concern  .  Not on file  Social History Narrative  . Not on file    History reviewed. No pertinent surgical history.    There is no immunization history on file for this patient.  Current Meds  Medication Sig  . [DISCONTINUED] tiZANidine (ZANAFLEX) 4 MG capsule Take 4 mg by mouth.    No Known Allergies  BP (!) 142/90 (BP Location: Left Arm, Patient Position: Sitting, Cuff Size: Large)   Pulse 84   Temp 98.1 F (36.7 C) (Oral)   Ht 5\' 5"  (1.651 m)   Wt 274 lb (124.3 kg)   LMP 09/11/2017   SpO2 98%   BMI 45.60 kg/m    Review of Systems  Constitutional: Negative.   HENT: Negative.   Eyes: Negative.   Respiratory: Negative.   Cardiovascular: Negative.   Gastrointestinal: Negative.   Endocrine: Negative.   Genitourinary: Negative.   Musculoskeletal: Negative.   Allergic/Immunologic: Negative.   Neurological: Negative.   Hematological: Negative.    Psychiatric/Behavioral: Negative.    Objective:   Physical Exam  Constitutional: She is oriented to person, place, and time. She appears well-developed and well-nourished.  HENT:  Head: Normocephalic and atraumatic.  Right Ear: External ear normal.  Left Ear: External ear normal.  Nose: Nose normal.  Mouth/Throat: Oropharynx is clear and moist.  Eyes: Pupils are equal, round, and reactive to light. Conjunctivae and EOM are normal.  Neck: Normal range of motion. Neck supple.  Cardiovascular: Normal rate, regular rhythm, normal heart sounds and intact distal pulses.  Pulmonary/Chest: Effort normal and breath sounds normal.  Abdominal: Soft. Bowel sounds are normal. She exhibits distension.  Musculoskeletal: Normal range of motion.  Neurological: She is alert and oriented to person, place, and time.  Skin: Skin is warm and dry. Capillary refill takes less than 2 seconds.  Psychiatric: She has a normal mood and affect. Her behavior is normal. Judgment and thought content normal.  Nursing note and vitals reviewed.  Assessment & Plan:   1. Encounter for medical examination to establish care - POCT glycosylated hemoglobin (Hb A1C) - POCT urinalysis dipstick - folic acid (FOLVITE) 1 MG tablet; Take 1 tablet (1 mg total) by mouth daily.  Dispense: 30 tablet; Refill: 11 - CBC with Differential - Comprehensive metabolic panel - TSH - Lipid Panel - Sickle Cell Panel  2. Hb-SS disease without crisis [facility]) She is doing well today. She will continue to take pain medications as prescribed; will continue to avoid extreme heat and cold; will continue to eat a healthy diet and drink at least 64 ounces of water daily; will avoid colds and flu; will continue to get plenty of sleep and rest; will continue to avoid high stressful situations and remain infection free; will continue Folic Acid 1 mg daily to avoid sickle cell crisis.  - folic acid (FOLVITE) 1 MG tablet; Take 1 tablet (1 mg total) by  mouth daily. .sick Dispense: 30 tablet; Refill: 11 - CBC with Differential - Comprehensive metabolic panel - TSH - Lipid Panel - Sickle Cell Panel  3. Muscle spasms of both lower extremities We will initiate Oxy-IR today because of increased pain. She will sign pain contact today. - oxyCODONE (OXY IR/ROXICODONE) 5 MG immediate release tablet; Take 1-2 tablets every 8 hours as needed for pain X 15 days.  Dispense: 90 tablet; Refill: 0 - tiZANidine (ZANAFLEX) 4 MG tablet; Take 1 tablet (4 mg total) by mouth every 8 (eight) hours as needed for muscle spasms.  Dispense: 90 tablet; Refill: 2  4. Follow up She will follow up in 2 months.  Meds ordered this encounter  Medications  . folic acid (FOLVITE) 1 MG tablet    Sig: Take 1 tablet (1 mg total) by mouth daily.    Dispense:  30 tablet    Refill:  11  . oxyCODONE (OXY IR/ROXICODONE) 5 MG immediate release tablet    Sig: Take 1-2 tablets every 8 hours as needed for pain X 15 days.    Dispense:  90 tablet    Refill:  0    Order Specific Question:   Supervising Provider    Answer:   Quentin Angst [id number]  . tiZANidine (ZANAFLEX) 4 MG tablet    Sig: Take 1 tablet (4 mg total) by mouth every 8 (eight) hours as needed for muscle spasms.    Dispense:  90 tablet    Refill:  2    [name],  MSN, FNP-C Patient Care Center [facility] [address] [place], [place] [address] [phone]

## 2017-09-11 NOTE — Patient Instructions (Signed)
Oxycodone tablets or capsules What is this medicine? OXYCODONE (ox i KOE done) is a pain reliever. It is used to treat moderate to severe pain. This medicine may be used for other purposes; ask your health care provider or pharmacist if you have questions. COMMON BRAND NAME(S): Dazidox, Endocodone, Oxaydo, OXECTA, OxyIR, Percolone, Roxicodone, ROXYBOND What should I tell my health care provider before I take this medicine? They need to know if you have any of these conditions: -Addison's disease -brain tumor -head injury -heart disease -history of drug or alcohol abuse problem -if you often drink alcohol -kidney disease -liver disease -lung or breathing disease, like asthma -mental illness -pancreatic disease -seizures -thyroid disease -an unusual or allergic reaction to oxycodone, codeine, hydrocodone, morphine, other medicines, foods, dyes, or preservatives -pregnant or trying to get pregnant -breast-feeding How should I use this medicine? Take this medicine by mouth with a glass of water. Follow the directions on the prescription label. You can take it with or without food. If it upsets your stomach, take it with food. Take your medicine at regular intervals. Do not take it more often than directed. Do not stop taking except on your doctor's advice. Some brands of this medicine, like Oxecta, have special instructions. Ask your doctor or pharmacist if these directions are for you: Do not cut, crush or chew this medicine. Swallow only one tablet at a time. Do not wet, soak, or lick the tablet before you take it. A special MedGuide will be given to you by the pharmacist with each prescription and refill. Be sure to read this information carefully each time. Talk to your pediatrician regarding the use of this medicine in children. Special care may be needed. Overdosage: If you think you have taken too much of this medicine contact a poison control center or emergency room at once. NOTE:  This medicine is only for you. Do not share this medicine with others. What if I miss a dose? If you miss a dose, take it as soon as you can. If it is almost time for your next dose, take only that dose. Do not take double or extra doses. What may interact with this medicine? This medicine may interact with the following medications: -alcohol -antihistamines for allergy, cough and cold -antiviral medicines for HIV or AIDS -atropine -certain antibiotics like clarithromycin, erythromycin, linezolid, rifampin -certain medicines for anxiety or sleep -certain medicines for bladder problems like oxybutynin, tolterodine -certain medicines for depression like amitriptyline, fluoxetine, sertraline -certain medicines for fungal infections like ketoconazole, itraconazole, voriconazole -certain medicines for migraine headache like almotriptan, eletriptan, frovatriptan, naratriptan, rizatriptan, sumatriptan, zolmitriptan -certain medicines for nausea or vomiting like dolasetron, ondansetron, palonosetron -certain medicines for Parkinson's disease like benztropine, trihexyphenidyl -certain medicines for seizures like phenobarbital, phenytoin, primidone -certain medicines for stomach problems like dicyclomine, hyoscyamine -certain medicines for travel sickness like scopolamine -diuretics -general anesthetics like halothane, isoflurane, methoxyflurane, propofol -ipratropium -local anesthetics like lidocaine, pramoxine, tetracaine -MAOIs like Carbex, Eldepryl, Marplan, Nardil, and Parnate -medicines that relax muscles for surgery -methylene blue -nilotinib -other narcotic medicines for pain or cough -phenothiazines like chlorpromazine, mesoridazine, prochlorperazine, thioridazine This list may not describe all possible interactions. Give your health care provider a list of all the medicines, herbs, non-prescription drugs, or dietary supplements you use. Also tell them if you smoke, drink alcohol, or use  illegal drugs. Some items may interact with your medicine. What should I watch for while using this medicine? Tell your doctor or health care professional if your pain does   not go away, if it gets worse, or if you have new or a different type of pain. You may develop tolerance to the medicine. Tolerance means that you will need a higher dose of the medicine for pain relief. Tolerance is normal and is expected if you take this medicine for a long time. Do not suddenly stop taking your medicine because you may develop a severe reaction. Your body becomes used to the medicine. This does NOT mean you are addicted. Addiction is a behavior related to getting and using a drug for a non-medical reason. If you have pain, you have a medical reason to take pain medicine. Your doctor will tell you how much medicine to take. If your doctor wants you to stop the medicine, the dose will be slowly lowered over time to avoid any side effects. There are different types of narcotic medicines (opiates). If you take more than one type at the same time or if you are taking another medicine that also causes drowsiness, you may have more side effects. Give your health care provider a list of all medicines you use. Your doctor will tell you how much medicine to take. Do not take more medicine than directed. Call emergency for help if you have problems breathing or unusual sleepiness. You may get drowsy or dizzy. Do not drive, use machinery, or do anything that needs mental alertness until you know how the medicine affects you. Do not stand or sit up quickly, especially if you are an older patient. This reduces the risk of dizzy or fainting spells. Alcohol may interfere with the effect of this medicine. Avoid alcoholic drinks. This medicine will cause constipation. Try to have a bowel movement at least every 2 to 3 days. If you do not have a bowel movement for 3 days, call your doctor or health care professional. Your mouth may get  dry. Chewing sugarless gum or sucking hard candy, and drinking plenty of water may help. Contact your doctor if the problem does not go away or is severe. What side effects may I notice from receiving this medicine? Side effects that you should report to your doctor or health care professional as soon as possible: -allergic reactions like skin rash, itching or hives, swelling of the face, lips, or tongue -breathing problems -confusion -signs and symptoms of low blood pressure like dizziness; feeling faint or lightheaded, falls; unusually weak or tired -trouble passing urine or change in the amount of urine -trouble swallowing Side effects that usually do not require medical attention (report to your doctor or health care professional if they continue or are bothersome): -constipation -dry mouth -nausea, vomiting -tiredness This list may not describe all possible side effects. Call your doctor for medical advice about side effects. You may report side effects to FDA at 1-800-FDA-1088. Where should I keep my medicine? Keep out of the reach of children. This medicine can be abused. Keep your medicine in a safe place to protect it from theft. Do not share this medicine with anyone. Selling or giving away this medicine is dangerous and against the law. Store at room temperature between 15 and 30 degrees C (59 and 86 degrees F). Protect from light. Keep container tightly closed. This medicine may cause accidental overdose and death if it is taken by other adults, children, or pets. Flush any unused medicine down the toilet to reduce the chance of harm. Do not use the medicine after the expiration date. NOTE: This sheet is a summary. It may   not cover all possible information. If you have questions about this medicine, talk to your doctor, pharmacist, or health care provider.  2018 Elsevier/Gold Standard (2014-12-05 16:55:57)  

## 2017-09-12 LAB — CMP14+CBC/D/PLT+FER+RETIC+V...
ALT: 26 IU/L (ref 0–32)
AST: 12 IU/L (ref 0–40)
Albumin/Globulin Ratio: 1.6 (ref 1.2–2.2)
Albumin: 4.7 g/dL (ref 3.5–5.5)
Alkaline Phosphatase: 78 IU/L (ref 39–117)
BUN/Creatinine Ratio: 12 (ref 9–23)
BUN: 8 mg/dL (ref 6–20)
Basophils Absolute: 0 10*3/uL (ref 0.0–0.2)
Basos: 0 %
Bilirubin Total: 1 mg/dL (ref 0.0–1.2)
CO2: 21 mmol/L (ref 20–29)
Calcium: 9.7 mg/dL (ref 8.7–10.2)
Chloride: 104 mmol/L (ref 96–106)
Creatinine, Ser: 0.69 mg/dL (ref 0.57–1.00)
EOS (ABSOLUTE): 0.1 10*3/uL (ref 0.0–0.4)
Eos: 1 %
Ferritin: 105 ng/mL (ref 15–150)
GFR calc Af Amer: 131 mL/min/{1.73_m2} (ref >59)
GFR calc non Af Amer: 114 mL/min/{1.73_m2} (ref >59)
Globulin, Total: 2.9 g/dL (ref 1.5–4.5)
Glucose: 82 mg/dL (ref 65–99)
Hematocrit: 38.4 % (ref 34.0–46.6)
Hemoglobin: 13 g/dL (ref 11.1–15.9)
Immature Grans (Abs): 0 10*3/uL (ref 0.0–0.1)
Immature Granulocytes: 0 %
Lymphocytes Absolute: 2.2 10*3/uL (ref 0.7–3.1)
Lymphs: 33 %
MCH: 25.3 pg — ABNORMAL LOW (ref 26.6–33.0)
MCHC: 33.9 g/dL (ref 31.5–35.7)
MCV: 75 fL — ABNORMAL LOW (ref 79–97)
Monocytes Absolute: 0.5 10*3/uL (ref 0.1–0.9)
Monocytes: 8 %
Neutrophils Absolute: 3.9 10*3/uL (ref 1.4–7.0)
Neutrophils: 58 %
Platelets: 258 10*3/uL (ref 150–450)
Potassium: 4.3 mmol/L (ref 3.5–5.2)
RBC: 5.14 x10E6/uL (ref 3.77–5.28)
RDW: 16.5 % — ABNORMAL HIGH (ref 12.3–15.4)
Retic Ct Pct: 2.6 % (ref 0.6–2.6)
Sodium: 142 mmol/L (ref 134–144)
Total Protein: 7.6 g/dL (ref 6.0–8.5)
Vit D, 25-Hydroxy: 19.5 ng/mL — ABNORMAL LOW (ref 30.0–100.0)
WBC: 6.8 10*3/uL (ref 3.4–10.8)

## 2017-09-12 LAB — LIPID PANEL
Chol/HDL Ratio: 2 ratio (ref 0.0–4.4)
Cholesterol, Total: 92 mg/dL — ABNORMAL LOW (ref 100–199)
HDL: 45 mg/dL (ref >39)
LDL Calculated: 36 mg/dL (ref 0–99)
Triglycerides: 53 mg/dL (ref 0–149)
VLDL Cholesterol Cal: 11 mg/dL (ref 5–40)

## 2017-09-12 LAB — TSH: TSH: 0.862 u[IU]/mL (ref 0.450–4.500)

## 2017-09-13 ENCOUNTER — Other Ambulatory Visit: Payer: Self-pay | Admitting: Family Medicine

## 2017-09-13 DIAGNOSIS — E559 Vitamin D deficiency, unspecified: Secondary | ICD-10-CM

## 2017-09-13 NOTE — Progress Notes (Signed)
Rx for Vitamin D to Pharmacy today.

## 2017-09-15 ENCOUNTER — Other Ambulatory Visit: Payer: Self-pay | Admitting: Family Medicine

## 2017-09-15 DIAGNOSIS — M62838 Other muscle spasm: Secondary | ICD-10-CM

## 2017-09-16 ENCOUNTER — Telehealth: Payer: Self-pay

## 2017-09-16 NOTE — Telephone Encounter (Signed)
-----   Message from Kallie LocksNatalie M Stroud, FNP sent at 09/13/2017  6:52 PM EDT ----- Regarding: "Lab Results" Stephanie Burch,   Please inform patient that her Vitamin D is low. We sent Rx for Vitamin D Supplement to pharmacy today. All other labs are stable.  Thank you.

## 2017-09-16 NOTE — Telephone Encounter (Signed)
Patient notified

## 2017-09-16 NOTE — Telephone Encounter (Signed)
-----   Message from [name] Stroud, FNP sent at 09/13/2017  6:52 PM EDT ----- Regarding: "Lab Results" Stephanie Burch,   Please inform patient that her Vitamin D is low. We sent Rx for Vitamin D Supplement to pharmacy today. All other labs are stable.  Thank you.  

## 2017-09-16 NOTE — Telephone Encounter (Signed)
Left a vm for patient to callback 

## 2017-09-17 MED ORDER — VITAMIN D (ERGOCALCIFEROL) 1.25 MG (50000 UNIT) PO CAPS: 50000.0000 [IU] | ORAL_CAPSULE | ORAL | 2 refills | Status: DC

## 2017-09-18 ENCOUNTER — Other Ambulatory Visit: Payer: Self-pay | Admitting: Family Medicine

## 2017-09-18 DIAGNOSIS — M62838 Other muscle spasm: Secondary | ICD-10-CM

## 2017-09-21 ENCOUNTER — Telehealth (HOSPITAL_COMMUNITY): Payer: Self-pay | Admitting: General Practice

## 2017-09-21 ENCOUNTER — Telehealth: Payer: Self-pay | Admitting: Family Medicine

## 2017-09-21 ENCOUNTER — Other Ambulatory Visit: Payer: Self-pay | Admitting: Family Medicine

## 2017-09-21 NOTE — Telephone Encounter (Signed)
Patient called today reporting increased leg pain. States that she left her pain medications in [place]. Patient inquiring about [facility] and IV pain medications. Patient lives in [place] and does not have a person to drive her back home after treatment. Advised her to report to local ED for Sickle Cell Crisis if pain continues to increase. Patient verifies understanding.     Raliegh IpNatalie Taishawn Smaldone,  MSN, FNP-C Patient Care [facility] [address]  [place], [place] [id number] [phone]

## 2017-09-21 NOTE — Telephone Encounter (Signed)
Patient called, complained of pain in the left leg that she rated as 9/10. Denied chest pain, fever, diarrhea, abdominal pain, nausea/vomitting. Admitted to having no means of transportation to drivie self after treatment. Not taken  pain medications because it impairs her driving. She "forgot" her pain medication in [place]. Provider notified Telford Nab(Hollis L. NP); per provider, patient should try to establish a relationship with an agency that can assist with transportation or contact PCP Dorene Grebe(Natalie S. NP) to see if patient can come in for pain management prescription. I contacted her PCP. Dorene Grebeatalie S contacted patient, advised patient to go to the nearest ER. Patient notified, verbalized understanding.

## 2017-09-24 ENCOUNTER — Other Ambulatory Visit: Payer: Self-pay | Admitting: Family Medicine

## 2017-09-24 ENCOUNTER — Other Ambulatory Visit: Payer: Self-pay

## 2017-09-24 DIAGNOSIS — M62838 Other muscle spasm: Secondary | ICD-10-CM

## 2017-09-24 DIAGNOSIS — D571 Sickle-cell disease without crisis: Secondary | ICD-10-CM

## 2017-09-24 MED ORDER — OXYCODONE HCL 5 MG PO TABS: ORAL_TABLET | ORAL | 0 refills | Status: DC

## 2017-09-24 MED FILL — oxyCODONE HCL 5 MG TABS: 5 | 15 days supply | Qty: 90 | Fill #0

## 2017-09-24 NOTE — Progress Notes (Signed)
Rx for [facility]IR to [facility] today.

## 2017-09-24 NOTE — Telephone Encounter (Signed)
Patient states that her pain medication was not at the pharmacy. Patient is coming from [place] to get script from [facility].

## 2017-10-07 ENCOUNTER — Other Ambulatory Visit: Payer: Self-pay

## 2017-10-07 ENCOUNTER — Other Ambulatory Visit: Payer: Self-pay | Admitting: Family Medicine

## 2017-10-07 DIAGNOSIS — D571 Sickle-cell disease without crisis: Secondary | ICD-10-CM

## 2017-10-07 DIAGNOSIS — M62838 Other muscle spasm: Secondary | ICD-10-CM

## 2017-10-07 DIAGNOSIS — Z0000 Encounter for general adult medical examination without abnormal findings: Secondary | ICD-10-CM

## 2017-10-07 MED ORDER — OXYCODONE HCL 5 MG PO TABS: ORAL_TABLET | ORAL | 0 refills | Status: DC

## 2017-10-07 MED ORDER — TIZANIDINE HCL 4 MG PO TABS: 4.0000 mg | ORAL_TABLET | Freq: Three times a day (TID) | ORAL | 11 refills | Status: DC | PRN

## 2017-10-07 MED ORDER — IBUPROFEN 800 MG PO TABS: 800.0000 mg | ORAL_TABLET | Freq: Three times a day (TID) | ORAL | 1 refills | Status: DC | PRN

## 2017-10-07 MED ORDER — FOLIC ACID 1 MG PO TABS: 1.0000 mg | ORAL_TABLET | Freq: Every day | ORAL | 11 refills | Status: DC

## 2017-10-07 MED FILL — IBUPROFEN 800 MG TAB: 800 | 10 days supply | Qty: 30 | Fill #0

## 2017-10-07 MED FILL — FOLIC ACID 1 MG TABS: 1 | 30 days supply | Qty: 30 | Fill #1

## 2017-10-07 MED FILL — tiZANidine HCL 4 MG TABS: 4 | 30 days supply | Qty: 90 | Fill #1

## 2017-10-07 MED FILL — oxyCODONE HCL 5 MG TABS: 5 | 15 days supply | Qty: 90 | Fill #0

## 2017-10-07 NOTE — Telephone Encounter (Signed)
Patient states that she is going to travel on a plane and wants to know if there are any precautions she should take. Patient is leaving Friday and needs to get her meds from [facility] before she leaves.

## 2017-10-07 NOTE — Telephone Encounter (Signed)
Patient also would like to know if she can get a script for Ibuprofen 800mg .

## 2017-10-07 NOTE — Progress Notes (Signed)
Rx fo Oxy-IR to [facility] today.

## 2017-10-15 ENCOUNTER — Encounter (HOSPITAL_COMMUNITY): Payer: Self-pay | Admitting: General Practice

## 2017-10-15 ENCOUNTER — Non-Acute Institutional Stay (HOSPITAL_COMMUNITY)
Admission: AD | Admit: 2017-10-15 | Discharge: 2017-10-15 | Disposition: A | Discharge status: Home / Self Care | Payer: Medicaid Other | Source: Ambulatory Visit | Attending: Internal Medicine | Admitting: Internal Medicine

## 2017-10-15 DIAGNOSIS — Z79899 Other long term (current) drug therapy: Secondary | ICD-10-CM | POA: Diagnosis not present

## 2017-10-15 DIAGNOSIS — M62838 Other muscle spasm: Secondary | ICD-10-CM

## 2017-10-15 DIAGNOSIS — D5700 Hb-SS disease with crisis, unspecified: Secondary | ICD-10-CM | POA: Insufficient documentation

## 2017-10-15 DIAGNOSIS — Z791 Long term (current) use of non-steroidal anti-inflammatories (NSAID): Secondary | ICD-10-CM | POA: Diagnosis not present

## 2017-10-15 DIAGNOSIS — D571 Sickle-cell disease without crisis: Secondary | ICD-10-CM

## 2017-10-15 LAB — CBC WITH DIFFERENTIAL/PLATELET
BASOS PCT: 0 %
Basophils Absolute: 0 10*3/uL (ref 0.0–0.1)
EOS PCT: 3 %
Eosinophils Absolute: 0.2 10*3/uL (ref 0.0–0.7)
HEMATOCRIT: 30.3 % — AB (ref 36.0–46.0)
Hemoglobin: 10.5 g/dL — ABNORMAL LOW (ref 12.0–15.0)
Lymphocytes Relative: 25 %
Lymphs Abs: 1.6 10*3/uL (ref 0.7–4.0)
MCH: 24.5 pg — AB (ref 26.0–34.0)
MCHC: 34.7 g/dL (ref 30.0–36.0)
MCV: 70.6 fL — AB (ref 78.0–100.0)
MONO ABS: 0.4 10*3/uL (ref 0.1–1.0)
MONOS PCT: 6 %
NEUTROS PCT: 66 %
Neutro Abs: 4.2 10*3/uL (ref 1.7–7.7)
PLATELETS: 125 10*3/uL — AB (ref 150–400)
RBC: 4.29 MIL/uL (ref 3.87–5.11)
RDW: 14.2 % (ref 11.5–15.5)
WBC: 6.4 10*3/uL (ref 4.0–10.5)

## 2017-10-15 LAB — COMPREHENSIVE METABOLIC PANEL
ALT: 30 U/L (ref 0–44)
AST: 22 U/L (ref 15–41)
Albumin: 4 g/dL (ref 3.5–5.0)
Alkaline Phosphatase: 69 U/L (ref 38–126)
Anion gap: 6 (ref 5–15)
BILIRUBIN TOTAL: 1.4 mg/dL — AB (ref 0.3–1.2)
BUN: 12 mg/dL (ref 6–20)
CHLORIDE: 111 mmol/L (ref 98–111)
CO2: 27 mmol/L (ref 22–32)
Calcium: 9.1 mg/dL (ref 8.9–10.3)
Creatinine, Ser: 0.63 mg/dL (ref 0.44–1.00)
GFR calc Af Amer: >60 mL/min (ref >60)
GFR calc non Af Amer: >60 mL/min (ref >60)
GLUCOSE: 83 mg/dL (ref 70–99)
POTASSIUM: 3.9 mmol/L (ref 3.5–5.1)
Sodium: 144 mmol/L (ref 135–145)
Total Protein: 7 g/dL (ref 6.5–8.1)

## 2017-10-15 LAB — PREGNANCY, URINE: Preg Test, Ur: NEGATIVE

## 2017-10-15 LAB — URINALYSIS, ROUTINE W REFLEX MICROSCOPIC
BILIRUBIN URINE: NEGATIVE
GLUCOSE, UA: NEGATIVE mg/dL
KETONES UR: NEGATIVE mg/dL
LEUKOCYTES UA: NEGATIVE
Nitrite: NEGATIVE
PH: 5 (ref 5.0–8.0)
Protein, ur: NEGATIVE mg/dL
SPECIFIC GRAVITY, URINE: 1.011 (ref 1.005–1.030)

## 2017-10-15 MED ORDER — SODIUM CHLORIDE 0.9% FLUSH: 9.0000 mL | INTRAVENOUS | Status: DC | PRN

## 2017-10-15 MED ORDER — OXYCODONE HCL 5 MG PO TABS
10.0000 mg | ORAL_TABLET | Freq: Once | ORAL | Status: AC
  Administered 2017-10-15: 10 mg via ORAL
  Filled 2017-10-15: qty 2

## 2017-10-15 MED ORDER — HYDROMORPHONE HCL 1 MG/ML IJ SOLN
1.0000 mg | Freq: Once | INTRAMUSCULAR | Status: AC
  Administered 2017-10-15: 1 mg via SUBCUTANEOUS
  Filled 2017-10-15: qty 1

## 2017-10-15 MED ORDER — TIZANIDINE HCL 4 MG PO TABS: 4.0000 mg | ORAL_TABLET | Freq: Three times a day (TID) | ORAL | 0 refills | Status: DC | PRN

## 2017-10-15 MED ORDER — SODIUM CHLORIDE 0.9 % IV SOLN
25.0000 mg | INTRAVENOUS | Status: DC | PRN
  Filled 2017-10-15: qty 0.5

## 2017-10-15 MED ORDER — HYDROMORPHONE 1 MG/ML IV SOLN
INTRAVENOUS | Status: DC
  Administered 2017-10-15: 30 mg via INTRAVENOUS
  Administered 2017-10-15: 7.5 mg via INTRAVENOUS
  Filled 2017-10-15: qty 30

## 2017-10-15 MED ORDER — IBUPROFEN 800 MG PO TABS: 800.0000 mg | ORAL_TABLET | Freq: Three times a day (TID) | ORAL | 0 refills | Status: DC | PRN

## 2017-10-15 MED ORDER — ONDANSETRON HCL 4 MG/2ML IJ SOLN: 4.0000 mg | Freq: Four times a day (QID) | INTRAMUSCULAR | 0 refills | Status: DC | PRN

## 2017-10-15 MED ORDER — OXYCODONE HCL 10 MG PO TABS: ORAL_TABLET | ORAL | 0 refills | Status: DC

## 2017-10-15 MED ORDER — KETOROLAC TROMETHAMINE 30 MG/ML IJ SOLN
15.0000 mg | Freq: Once | INTRAMUSCULAR | Status: AC
  Administered 2017-10-15: 15 mg via INTRAVENOUS
  Filled 2017-10-15: qty 1

## 2017-10-15 MED ORDER — NALOXONE HCL 0.4 MG/ML IJ SOLN: 0.4000 mg | INTRAMUSCULAR | Status: DC | PRN

## 2017-10-15 MED ORDER — KETOROLAC TROMETHAMINE 30 MG/ML IJ SOLN
30.0000 mg | Freq: Once | INTRAMUSCULAR | Status: AC
  Administered 2017-10-15: 30 mg via INTRAMUSCULAR
  Filled 2017-10-15: qty 1

## 2017-10-15 MED ORDER — DIPHENHYDRAMINE HCL 25 MG PO CAPS: 25.0000 mg | ORAL_CAPSULE | ORAL | Status: DC | PRN

## 2017-10-15 MED ORDER — DEXTROSE-NACL 5-0.45 % IV SOLN
INTRAVENOUS | Status: DC
  Administered 2017-10-15: 10:00:00 via INTRAVENOUS

## 2017-10-15 MED ORDER — ONDANSETRON HCL 4 MG/2ML IJ SOLN
4.0000 mg | Freq: Four times a day (QID) | INTRAMUSCULAR | Status: DC | PRN
  Administered 2017-10-15: 4 mg via INTRAVENOUS
  Filled 2017-10-15: qty 2

## 2017-10-15 MED FILL — tiZANidine HCL 4 MG TABS: 4 | 5 days supply | Qty: 16 | Fill #0

## 2017-10-15 MED FILL — oxyCODONE HCL 10 MG TABS: 10 | 3 days supply | Qty: 20 | Fill #0

## 2017-10-15 MED FILL — IBUPROFEN 800 MG TAB: 800 | 10 days supply | Qty: 30 | Fill #0

## 2017-10-15 NOTE — H&P (Signed)
[facility] History and Physical   Date: 10/15/2017  Patient name: Stephanie Burch Medical record number: [id number] Date of birth: 1980-12-23 Age: 37 y.o. Gender: female PCP: Kallie Locks, FNP  Attending physician: Quentin Angst, MD  Chief Complaint:Left leg pain History of Present Illness: Stephanie Burch, a 37 year old female with a history of sickle cell anemia, hemoglobin [place] presents complaining of left leg pain for greater than 1 week.  Patient states that she is been experiencing excruciating pain to left leg for 1 week that is been unrelieved by home pain medications.  Patient states that she has been taking oxycodone 5 mg every 8 hours and will set this a.m. without sustained relief.  Current pain intensity is 10/10 characterized as constant and throbbing.  Patient states the pain is aggravated by bearing weight, pivoting movements, and transitioning from sitting to standing.  She also endorses nausea.  She denies headache, fatigue, shortness of breath, chest pain, heart palpitations, dysuria, diarrhea, or constipation.  Patient will be admitted to the day infusion center for pain management and extended observation.  Meds: Medications Prior to Admission  Medication Sig Dispense Refill Last Dose  . folic acid (FOLVITE) 1 MG tablet Take 1 tablet (1 mg total) by mouth daily. 30 tablet 11   . ibuprofen (ADVIL,MOTRIN) 800 MG tablet Take 1 tablet (800 mg total) by mouth every 8 (eight) hours as needed. 30 tablet 1   . oxyCODONE (OXY IR/ROXICODONE) 5 MG immediate release tablet Take 1-2 tablets every 8 hours as needed for pain X 15 days. 90 tablet 0   . tiZANidine (ZANAFLEX) 4 MG tablet Take 1 tablet (4 mg total) by mouth every 8 (eight) hours as needed for muscle spasms. 90 tablet 11   . Vitamin D, Ergocalciferol, (DRISDOL) 50000 units CAPS capsule Take 1 capsule (50,000 Units total) by mouth every 7 (seven) days. 5 capsule 2     Allergies: Patient has no known  allergies. Past Medical History:  Diagnosis Date  . Sickle cell anemia (HCC)    No past surgical history on file. Family History  Problem Relation Age of Onset  . Hypertension Mother   . Sickle cell trait Mother   . Diabetes Father   . Sickle cell trait Father    Social History   Socioeconomic History  . Marital status: Single    Spouse name: Not on file  . Number of children: Not on file  . Years of education: Not on file  . Highest education level: Not on file  Occupational History  . Not on file  Social Needs  . Financial resource strain: Not on file  . Food insecurity:    Worry: Not on file    Inability: Not on file  . Transportation needs:    Medical: Not on file    Non-medical: Not on file  Tobacco Use  . Smoking status: Never Smoker  . Smokeless tobacco: Never Used  Substance and Sexual Activity  . Alcohol use: Never    Frequency: Never  . Drug use: Never  . Sexual activity: Yes    Birth control/protection: None  Lifestyle  . Physical activity:    Days per week: Not on file    Minutes per session: Not on file  . Stress: Not on file  Relationships  . Social connections:    Talks on phone: Not on file    Gets together: Not on file    Attends religious service: Not on file  Active member of club or organization: Not on file    Attends meetings of clubs or organizations: Not on file    Relationship status: Not on file  . Intimate partner violence:    Fear of current or ex partner: Not on file    Emotionally abused: Not on file    Physically abused: Not on file    Forced sexual activity: Not on file  Other Topics Concern  . Not on file  Social History Narrative  . Not on file   Review of Systems  Constitutional: Negative for chills and fever.  HENT: Negative.   Eyes: Negative.   Respiratory: Negative.   Cardiovascular: Negative.   Gastrointestinal: Negative.   Genitourinary: Negative.   Musculoskeletal: Positive for myalgias (Left leg pain).   Skin: Negative.   Neurological: Negative.   Endo/Heme/Allergies: Negative.   Psychiatric/Behavioral: Negative.  Negative for hallucinations. The patient is not nervous/anxious.     Physical Exam: There were no vitals taken for this visit. Physical Exam  Constitutional: She is oriented to person, place, and time. She appears well-developed and well-nourished.  HENT:  Head: Normocephalic and atraumatic.  Eyes: Pupils are equal, round, and reactive to light.  Neck: Normal range of motion. Neck supple.  Cardiovascular: Normal rate, regular rhythm and normal heart sounds.  Pulmonary/Chest: Effort normal and breath sounds normal.  Abdominal: Soft. Bowel sounds are normal.  Musculoskeletal:       Left hip: She exhibits decreased range of motion and decreased strength.       Left knee: She exhibits decreased range of motion. She exhibits no swelling.  Neurological: She is alert and oriented to person, place, and time.  Skin: Skin is warm and dry.  Psychiatric: She has a normal mood and affect. Her behavior is normal. Judgment and thought content normal.    Lab results: No results found for this or any previous visit (from the past 24 hour(s)).  Imaging results:  No results found.   Assessment & Plan:  Patient will be admitted to the day infusion center for extended observation  Start IV D5.45 for cellular rehydration at 125/hr  Start Toradol 30 mg IV every 6 hours for inflammation.  Start Dilaudid PCA High Concentration per weight based protocol.   Patient will be re-evaluated for pain intensity in the context of function and relationship to baseline as care progresses.  If no significant pain relief, will transfer patient to inpatient services for a higher level of care.   Will check CMP, reticulocytes and CBC w/differential  Nolon NationsLachina Moore Mehr Depaoli  APRN, MSN, FNP-C Patient Care [facility] [address]  [place], [place]  [id number] [phone]  10/15/2017, 9:35 AM

## 2017-10-15 NOTE — Progress Notes (Signed)
Pt called states experiencing pain in left leg unrelieved by home pain medications; denies chest pain, shortness of breath, has some nausea; NP notified; pt notified, per NP, that she may come to patient care center for evaluation

## 2017-10-15 NOTE — Discharge Summary (Signed)
[facility] Discharge Summary   Patient ID: Stephanie Burch MRN: [id number] DOB/AGE: [birth year] 37 y.o.  Admit date: 10/15/2017 Discharge date: 10/15/2017  Primary Care Physician:  Kallie LocksStroud, [name], FNP  Admission Diagnoses:  Active Problems:   Sickle cell pain crisis [facility](HCC)  Discharge Medications:  Allergies as of 10/15/2017   No Known Allergies     Medication List    TAKE these medications   folic acid 1 MG tablet Commonly known as:  FOLVITE Take 1 tablet (1 mg total) by mouth daily.   ibuprofen 800 MG tablet Commonly known as:  ADVIL,MOTRIN Take 1 tablet (800 mg total) by mouth every 8 (eight) hours as needed.   ondansetron 4 MG/2ML Soln injection Commonly known as:  ZOFRAN Inject 2 mLs (4 mg total) into the vein every 6 (six) hours as needed for nausea or vomiting.   Oxycodone HCl 10 MG Tabs Take 1 tablet every 4 hours for moderate to severe pain. What changed:    medication strength  additional instructions   tiZANidine 4 MG tablet Commonly known as:  ZANAFLEX Take 1 tablet (4 mg total) by mouth every 8 (eight) hours as needed for muscle spasms.   Vitamin D (Ergocalciferol) 50000 units Caps capsule Commonly known as:  DRISDOL Take 1 capsule (50,000 Units total) by mouth every 7 (seven) days.        Consults:  None  Significant Diagnostic Studies:  No results found.   [facility] Course: Stephanie StarchKimaada Leland, a 37 year old female with a history of sickle cell anemia, hemoglobin [place] was admitted to day infusion center and sickle cell crisis. Hemoglobin is currently 10.5, which is consistent with baseline. Patient remained hemodynamically stable during admission. Hypotonic IV fluids initiated for cellular rehydration. Toradol 15 mg IV x1 Dilaudid PCA per weight-based protocol for pain management.  Patient used a total of 7.5 mg with 14 demands and 14 deliveries.  Her IV infiltrated during admission.  She received Dilaudid 1 mg SQ  x1 and Toradol 15 mg IM x1. Oxycodone 10 mg x 1. Pain intensity remained elevated at 8/10.  Discussed admission at length.  Patient refuses admission at this time, she states that she is unable to due to family constraints. Patient received prescription for oxycodone 10 mg every 4 hours #20 for a total of 3 days.  Patient will follow-up in primary care on 10/19/2017. Patient alert, oriented, and ambulating.  Discharge instructions: Resume home medications.  Oxycodone has been increased to 10 mg every 4 hours as needed for 3 days.  Patient will schedule follow-up with primary provider for medication management. Recommend increasing fluid intake to 64 ounces per day Also, ibuprofen 800 mg every 8 hours as needed with the meal has been sent to pharmacy. I strongly recommend admission to inpatient services.  However, patient states that she cannot be admitted at this time.  Patient was advised to report to emergency department if condition worsens, she expressed understanding. Physical Exam at Discharge:  BP (!) 147/81 (BP Location: Right Arm)   Pulse 80   Temp 98.5 F (36.9 C) (Oral)   Resp 16   Wt 287 lb 6.4 oz (130.4 kg)   LMP 10/09/2017   SpO2 100%   BMI 47.83 kg/m  Physical Exam  Constitutional: She is oriented to person, place, and time. She appears well-developed and well-nourished.  HENT:  Head: Normocephalic and atraumatic.  Eyes: Pupils are equal, round, and reactive to light.  Neck: Normal range of motion.  Cardiovascular: Normal rate, regular rhythm and normal heart sounds.  Pulmonary/Chest: Effort normal and breath sounds normal.  Abdominal: Soft. Bowel sounds are normal.  Musculoskeletal:       Left shoulder: She exhibits decreased range of motion and tenderness.       Left knee: She exhibits decreased range of motion.  Neurological: She is alert and oriented to person, place, and time.  Skin: Skin is warm and dry.  Psychiatric: She has a normal mood and affect. Her  behavior is normal. Judgment and thought content normal.     Disposition at Discharge: Discharge disposition: 01-Home or Self Care       Discharge Orders: Discharge Instructions    Discharge patient   Complete by:  As directed    Discharge disposition:  01-Home or Self Care   Discharge patient date:  10/15/2017      Condition at Discharge:   Stable  Time spent on Discharge:  Greater than 30 minutes.  Signed: Nolon Nations  APRN, MSN, FNP-C Patient Care [facility] [address] [place], [place] [address] [phone]  10/15/2017, 4:33 PM

## 2017-10-15 NOTE — Progress Notes (Signed)
Pt discharged to home; discharge instructions explained, given; prescription given to patient; pt alert, oriented, and ambulatory upon discharge; no complications noted

## 2017-10-15 NOTE — Progress Notes (Signed)
Pt's IV site infiltrated and removed; attempted to obtain another IV site for IV fluid and medication infusion; NP notified; will continue to monitor

## 2017-10-15 NOTE — Discharge Instructions (Signed)
Sickle Cell Anemia, Adult °Sickle cell anemia is a condition where your red blood cells are shaped like sickles. Red blood cells carry oxygen through the body. Sickle-shaped red blood cells do not live as long as normal red blood cells. They also clump together and block blood from flowing through the blood vessels. These things prevent the body from getting enough oxygen. Sickle cell anemia causes organ damage and pain. It also increases the risk of infection. °Follow these instructions at home: °· Drink enough fluid to keep your pee (urine) clear or pale yellow. Drink more in hot weather and during exercise. °· Do not smoke. Smoking lowers oxygen levels in the blood. °· Only take over-the-counter or prescription medicines as told by your doctor. °· Take antibiotic medicines as told by your doctor. Make sure you finish them even if you start to feel better. °· Take supplements as told by your doctor. °· Consider wearing a medical alert bracelet. This tells anyone caring for you in an emergency of your condition. °· When traveling, keep your medical information, doctors' names, and the medicines you take with you at all times. °· If you have a fever, do not take fever medicines right away. This could cover up a problem. Tell your doctor. °· Keep all follow-up visits with your doctor. Sickle cell anemia requires regular medical care. °Contact a doctor if: °You have a fever. °Get help right away if: °· You feel dizzy or faint. °· You have new belly (abdominal) pain, especially on the left side near the stomach area. °· You have a lasting, often uncomfortable and painful erection of the penis (priapism). If it is not treated right away, you will become unable to have sex (impotence). °· You have numbness in your arms or legs or you have a hard time moving them. °· You have a hard time talking. °· You have a fever or lasting symptoms for more than 2-3 days. °· You have a fever and your symptoms suddenly get  worse. °· You have signs or symptoms of infection. These include: °? Chills. °? Being more tired than normal (lethargy). °? Irritability. °? Poor eating. °? Throwing up (vomiting). °· You have pain that is not helped with medicine. °· You have shortness of breath. °· You have pain in your chest. °· You are coughing up pus-like or bloody mucus. °· You have a stiff neck. °· Your feet or hands swell or have pain. °· Your belly looks bloated. °· Your joints hurt. °This information is not intended to replace advice given to you by your health care provider. Make sure you discuss any questions you have with your health care provider. °Document Released: 11/10/2012 Document Revised: 06/28/2015 Document Reviewed: 09/01/2012 °Elsevier Interactive Patient Education © 2017 [employer] ° °

## 2017-10-17 ENCOUNTER — Other Ambulatory Visit: Payer: Self-pay | Admitting: Family Medicine

## 2017-10-17 DIAGNOSIS — M62838 Other muscle spasm: Secondary | ICD-10-CM

## 2017-10-17 DIAGNOSIS — D571 Sickle-cell disease without crisis: Secondary | ICD-10-CM

## 2017-10-17 MED ORDER — OXYCODONE HCL 10 MG PO TABS: ORAL_TABLET | ORAL | 0 refills | Status: DC

## 2017-10-17 MED ORDER — TIZANIDINE HCL 4 MG PO TABS: 4.0000 mg | ORAL_TABLET | Freq: Three times a day (TID) | ORAL | 1 refills | Status: DC | PRN

## 2017-10-17 NOTE — Progress Notes (Signed)
Rx for Oxycodone and Tizanidine [facility] today. Patient is aware.

## 2017-10-18 ENCOUNTER — Other Ambulatory Visit: Payer: Self-pay

## 2017-10-18 ENCOUNTER — Emergency Department (HOSPITAL_COMMUNITY): Payer: Medicaid Other

## 2017-10-18 ENCOUNTER — Inpatient Hospital Stay (HOSPITAL_COMMUNITY)
Admission: EM | Admit: 2017-10-18 | Discharge: 2017-10-21 | DRG: 812 | Disposition: A | Discharge status: Home / Self Care | Payer: Medicaid Other | Attending: Internal Medicine | Admitting: Internal Medicine

## 2017-10-18 ENCOUNTER — Encounter (HOSPITAL_COMMUNITY): Payer: Self-pay | Admitting: *Deleted

## 2017-10-18 DIAGNOSIS — D5700 Hb-SS disease with crisis, unspecified: Principal | ICD-10-CM | POA: Diagnosis present

## 2017-10-18 DIAGNOSIS — Z79891 Long term (current) use of opiate analgesic: Secondary | ICD-10-CM

## 2017-10-18 DIAGNOSIS — Z6841 Body Mass Index (BMI) 40.0 and over, adult: Secondary | ICD-10-CM | POA: Diagnosis present

## 2017-10-18 DIAGNOSIS — Z791 Long term (current) use of non-steroidal anti-inflammatories (NSAID): Secondary | ICD-10-CM

## 2017-10-18 DIAGNOSIS — Z79899 Other long term (current) drug therapy: Secondary | ICD-10-CM

## 2017-10-18 DIAGNOSIS — D696 Thrombocytopenia, unspecified: Secondary | ICD-10-CM | POA: Diagnosis present

## 2017-10-18 DIAGNOSIS — D72829 Elevated white blood cell count, unspecified: Secondary | ICD-10-CM | POA: Diagnosis present

## 2017-10-18 DIAGNOSIS — Z8249 Family history of ischemic heart disease and other diseases of the circulatory system: Secondary | ICD-10-CM

## 2017-10-18 DIAGNOSIS — Z832 Family history of diseases of the blood and blood-forming organs and certain disorders involving the immune mechanism: Secondary | ICD-10-CM

## 2017-10-18 DIAGNOSIS — R000 Tachycardia, unspecified: Secondary | ICD-10-CM

## 2017-10-18 DIAGNOSIS — Z833 Family history of diabetes mellitus: Secondary | ICD-10-CM

## 2017-10-18 LAB — COMPREHENSIVE METABOLIC PANEL
ALBUMIN: 3.9 g/dL (ref 3.5–5.0)
ALT: 22 U/L (ref 0–44)
ANION GAP: 11 (ref 5–15)
AST: 24 U/L (ref 15–41)
Alkaline Phosphatase: 77 U/L (ref 38–126)
BUN: 8 mg/dL (ref 6–20)
CHLORIDE: 103 mmol/L (ref 98–111)
CO2: 23 mmol/L (ref 22–32)
Calcium: 9.1 mg/dL (ref 8.9–10.3)
Creatinine, Ser: 0.6 mg/dL (ref 0.44–1.00)
GFR calc Af Amer: >60 mL/min (ref >60)
GFR calc non Af Amer: >60 mL/min (ref >60)
GLUCOSE: 106 mg/dL — AB (ref 70–99)
POTASSIUM: 3.6 mmol/L (ref 3.5–5.1)
Sodium: 137 mmol/L (ref 135–145)
Total Bilirubin: 2 mg/dL — ABNORMAL HIGH (ref 0.3–1.2)
Total Protein: 7.5 g/dL (ref 6.5–8.1)

## 2017-10-18 LAB — CBC WITH DIFFERENTIAL/PLATELET
Basophils Absolute: 0 10*3/uL (ref 0.0–0.1)
Basophils Relative: 0 %
EOS PCT: 1 %
Eosinophils Absolute: 0.1 10*3/uL (ref 0.0–0.7)
HCT: 29.4 % — ABNORMAL LOW (ref 36.0–46.0)
HEMOGLOBIN: 9.9 g/dL — AB (ref 12.0–15.0)
LYMPHS ABS: 2.3 10*3/uL (ref 0.7–4.0)
LYMPHS PCT: 17 %
MCH: 24.5 pg — ABNORMAL LOW (ref 26.0–34.0)
MCHC: 33.7 g/dL (ref 30.0–36.0)
MCV: 72.8 fL — AB (ref 78.0–100.0)
MONOS PCT: 9 %
Monocytes Absolute: 1.2 10*3/uL — ABNORMAL HIGH (ref 0.1–1.0)
NEUTROS ABS: 10.1 10*3/uL — AB (ref 1.7–7.7)
Neutrophils Relative %: 73 %
Platelets: 108 10*3/uL — ABNORMAL LOW (ref 150–400)
RBC: 4.04 MIL/uL (ref 3.87–5.11)
RDW: 14.9 % (ref 11.5–15.5)
WBC: 13.7 10*3/uL — ABNORMAL HIGH (ref 4.0–10.5)

## 2017-10-18 LAB — RETICULOCYTES
RBC.: 4.04 MIL/uL (ref 3.87–5.11)
RETIC COUNT ABSOLUTE: 315.1 10*3/uL — AB (ref 19.0–186.0)
Retic Ct Pct: 7.8 % — ABNORMAL HIGH (ref 0.4–3.1)

## 2017-10-18 MED ORDER — KETOROLAC TROMETHAMINE 30 MG/ML IJ SOLN
30.0000 mg | INTRAMUSCULAR | Status: AC
  Administered 2017-10-18: 30 mg via INTRAVENOUS
  Filled 2017-10-18: qty 1

## 2017-10-18 MED ORDER — DIPHENHYDRAMINE HCL 50 MG/ML IJ SOLN
25.0000 mg | Freq: Four times a day (QID) | INTRAMUSCULAR | Status: DC | PRN
  Administered 2017-10-18: 25 mg via INTRAVENOUS
  Filled 2017-10-18: qty 1

## 2017-10-18 MED ORDER — HYDROMORPHONE HCL 1 MG/ML IJ SOLN: 1.0000 mg | INTRAMUSCULAR | Status: AC

## 2017-10-18 MED ORDER — HYDROMORPHONE HCL 1 MG/ML IJ SOLN
1.0000 mg | INTRAMUSCULAR | Status: AC
  Administered 2017-10-18: 1 mg via INTRAVENOUS
  Filled 2017-10-18: qty 1

## 2017-10-18 MED ORDER — SODIUM CHLORIDE 0.45 % IV SOLN
INTRAVENOUS | Status: DC
  Administered 2017-10-18 – 2017-10-20 (×5): via INTRAVENOUS

## 2017-10-18 MED ORDER — HYDROMORPHONE HCL 1 MG/ML IJ SOLN
1.0000 mg | INTRAMUSCULAR | Status: AC
  Administered 2017-10-19: 1 mg via INTRAVENOUS
  Filled 2017-10-18: qty 1

## 2017-10-18 MED ORDER — HYDROMORPHONE HCL 1 MG/ML IJ SOLN
0.5000 mg | Freq: Once | INTRAMUSCULAR | Status: AC
  Administered 2017-10-18: 0.5 mg via SUBCUTANEOUS
  Filled 2017-10-18: qty 1

## 2017-10-18 MED ORDER — ONDANSETRON HCL 4 MG/2ML IJ SOLN: 4.0000 mg | INTRAMUSCULAR | Status: DC | PRN

## 2017-10-18 NOTE — ED Notes (Signed)
Patient educated about not driving or performing other critical tasks (such as operating heavy machinery, caring for infant/toddler/child) due to sedative nature of narcotic medications received while in the ED.  Pt/caregiver verbalized understanding.   

## 2017-10-18 NOTE — ED Triage Notes (Signed)
Left leg pain, consistent with sickle cell pain since Monday. She was see on the 12th for the same at the Clinic, pain not improved. She ran out of her meds yesterday.

## 2017-10-18 NOTE — ED Notes (Signed)
Patient transported to X-ray 

## 2017-10-18 NOTE — ED Provider Notes (Signed)
[facility]-EMERGENCY DEPT Provider Note   CSN: [id number] Arrival date & time: 10/18/17  2150     History   Chief Complaint Chief Complaint  Patient presents with  . Sickle Cell Pain Crisis    HPI Candy Leverett is a 37 y.o. female.  The history is provided by the patient and medical records.     37 year old female with history of sickle cell anemia type SS, presenting to the ED for pain crisis.  Reports this began almost a week ago.  She was seen at the sickle cell clinic on 10/15/2017 for same and was feeling better but symptoms have since returned.  She reports diffuse pain throughout her left leg which is typical for her during crisis.  She denies any swelling, injury, or trauma to the leg.  No back pain.  No numbness or weakness.  No difficulty walking.  States he has been having a little bit of shortness of breath but unsure if this is due to pain.  She denies any cough, fever, or chest pain.  No history of DVT or PE.  She has been taking her home oxycodone without relief.  Past Medical History:  Diagnosis Date  . Sickle cell anemia [facility])     Patient Active Problem List   Diagnosis Date Noted  . Sickle cell pain crisis (HCC) 10/15/2017    History reviewed. No pertinent surgical history.   OB History   None      Home Medications    Prior to Admission medications   Medication Sig Start Date End Date Taking? Authorizing Provider  folic acid (FOLVITE) 1 MG tablet Take 1 tablet (1 mg total) by mouth daily. 10/07/17  Yes Kallie Locks, FNP  ibuprofen (ADVIL,MOTRIN) 800 MG tablet Take 1 tablet (800 mg total) by mouth every 8 (eight) hours as needed. Patient taking differently: Take 800 mg by mouth every 8 (eight) hours as needed for headache, mild pain, moderate pain or cramping.  10/15/17  Yes Massie Maroon, FNP  ondansetron [facility]) 4 MG/2ML SOLN injection Inject 2 mLs (4 mg total) into the vein every 6 (six) hours as needed for nausea or  vomiting. 10/15/17  Yes Massie Maroon, FNP  Oxycodone HCl 10 MG TABS Take 1 tablet every 4 hours for moderate to severe pain. Patient taking differently: Take 10 mg by mouth every 4 (four) hours as needed (severe pain).  10/17/17  Yes Kallie Locks, FNP  tiZANidine (ZANAFLEX) 4 MG tablet Take 1 tablet (4 mg total) by mouth every 8 (eight) hours as needed for muscle spasms. 10/17/17  Yes Kallie Locks, FNP  Vitamin D, Ergocalciferol, (DRISDOL) 50000 units CAPS capsule Take 1 capsule (50,000 Units total) by mouth every 7 (seven) days. 09/17/17  Yes Kallie Locks, FNP    Family History Family History  Problem Relation Age of Onset  . Hypertension Mother   . Sickle cell trait Mother   . Diabetes Father   . Sickle cell trait Father     Social History Social History   Tobacco Use  . Smoking status: Never Smoker  . Smokeless tobacco: Never Used  Substance Use Topics  . Alcohol use: Never    Frequency: Never  . Drug use: Never     Allergies   Patient has no known allergies.   Review of Systems Review of Systems  Musculoskeletal: Positive for arthralgias.  All other systems reviewed and are negative.    Physical Exam Updated Vital Signs  Ht 5\' 5"  (1.651 m)   Wt 122.5 kg   LMP 10/09/2017   BMI 44.93 kg/m   Physical Exam  Constitutional: She is oriented to person, place, and time. She appears well-developed and well-nourished.  HENT:  Head: Normocephalic and atraumatic.  Mouth/Throat: Oropharynx is clear and moist.  Eyes: Pupils are equal, round, and reactive to light. Conjunctivae and EOM are normal.  Neck: Normal range of motion.  Cardiovascular: Normal rate, regular rhythm and normal heart sounds.  Pulmonary/Chest: Effort normal and breath sounds normal. No stridor. No respiratory distress. She has no wheezes. She has no rhonchi.  Abdominal: Soft. Bowel sounds are normal. There is no tenderness. There is no rebound.  Musculoskeletal: Normal range of  motion.  Left leg overall normal in appearance without swelling or redness, no palpable cords; negative Homan's sign  Neurological: She is alert and oriented to person, place, and time.  Skin: Skin is warm and dry.  Psychiatric: She has a normal mood and affect.  Nursing note and vitals reviewed.    ED Treatments / Results  Labs (all labs ordered are listed, but only abnormal results are displayed) Labs Reviewed  COMPREHENSIVE METABOLIC PANEL - Abnormal; Notable for the following components:      Result Value   Glucose, Bld 106 (*)    Total Bilirubin 2.0 (*)    All other components within normal limits  CBC WITH DIFFERENTIAL/PLATELET - Abnormal; Notable for the following components:   WBC 13.7 (*)    Hemoglobin 9.9 (*)    HCT 29.4 (*)    MCV 72.8 (*)    MCH 24.5 (*)    Platelets 108 (*)    Neutro Abs 10.1 (*)    Monocytes Absolute 1.2 (*)    All other components within normal limits  RETICULOCYTES - Abnormal; Notable for the following components:   Retic Ct Pct 7.8 (*)    Retic Count, Absolute 315.1 (*)    All other components within normal limits  PREGNANCY, URINE    EKG None  Radiology Dg Chest 2 View  Result Date: 10/18/2017 CLINICAL DATA:  Patient with left leg pain. EXAM: CHEST - 2 VIEW COMPARISON:  None. FINDINGS: Monitoring leads overlie the patient. Normal cardiac scratch the cardiac contours upper limits of normal. No consolidative pulmonary opacities. No pleural effusion or pneumothorax. IMPRESSION: No acute cardiopulmonary process. Electronically Signed   By: Annia Belt M.D.   On: 10/18/2017 23:45    Procedures Procedures (including critical care time)  Medications Ordered in ED Medications  0.45 % sodium chloride infusion ( Intravenous New Bag/Given 10/18/17 2324)  HYDROmorphone (DILAUDID) injection 1 mg (1 mg Intravenous Given 10/19/17 0114)    Or  HYDROmorphone (DILAUDID) injection 1 mg ( Subcutaneous See Alternative 10/19/17 0114)  HYDROmorphone  (DILAUDID) injection 1 mg (has no administration in time range)    Or  HYDROmorphone (DILAUDID) injection 1 mg (has no administration in time range)  folic acid (FOLVITE) tablet 1 mg (has no administration in time range)  senna-docusate (Senokot-S) tablet 1 tablet (has no administration in time range)  polyethylene glycol (MIRALAX / GLYCOLAX) packet 17 g (has no administration in time range)  naloxone (NARCAN) injection 0.4 mg (has no administration in time range)    And  sodium chloride flush (NS) 0.9 % injection 9 mL (has no administration in time range)  ondansetron (ZOFRAN) injection 4 mg (has no administration in time range)  diphenhydrAMINE (BENADRYL) injection 12.5 mg (has no administration in time range)  Or  diphenhydrAMINE (BENADRYL) 12.5 MG/5ML elixir 12.5 mg (has no administration in time range)  enoxaparin (LOVENOX) injection 40 mg (has no administration in time range)  ketorolac (TORADOL) 15 MG/ML injection 15 mg (has no administration in time range)  HYDROmorphone (DILAUDID) 1 mg/mL PCA injection (has no administration in time range)  HYDROmorphone (DILAUDID) injection 0.5 mg (0.5 mg Subcutaneous Given 10/18/17 2211)  ketorolac (TORADOL) 30 MG/ML injection 30 mg (30 mg Intravenous Given 10/18/17 2319)  HYDROmorphone (DILAUDID) injection 1 mg (1 mg Intravenous Given 10/18/17 2330)    Or  HYDROmorphone (DILAUDID) injection 1 mg ( Subcutaneous See Alternative 10/18/17 2330)  HYDROmorphone (DILAUDID) injection 1 mg (1 mg Intravenous Given 10/19/17 0040)    Or  HYDROmorphone (DILAUDID) injection 1 mg ( Subcutaneous See Alternative 10/19/17 0040)     Initial Impression / Assessment and Plan / ED Course  I have reviewed the triage vital signs and the nursing notes.  Pertinent labs & imaging results that were available during my care of the patient were reviewed by me and considered in my medical decision making (see chart for details).  37 year old female here with sickle cell  pain crisis.  Reports pain throughout the left leg which is typical for her.  At the clinic on 10/15/2017 with some improvement for a few days but pain have since recurred.  She is afebrile and nontoxic.  Left leg is overall normal in appearance without visible swelling, erythema, or palpable cord.  No history of DVT or PE.  Does report some shortness of breath but unsure if this is related to the degree of pain.  Labs, chest x-ray, EKG pending.  Labs overall reassuring.  Chest x-ray is clear.  We will continue sickle cell protocol for pain control.  Patient reports pain currently 10/10, initially got somewhat better but worsening again.  Does not feel she can manage at home.  Discussed with Dr. Katrinka BlazingSmith-- will admit for ongoing care.  Final Clinical Impressions(s) / ED Diagnoses   Final diagnoses:  Sickle cell pain crisis [facility](HCC)    ED Discharge Orders    None       Garlon HatchetSanders, Jaycion Treml M, PA-C 10/19/17 0341    Melene PlanFloyd, Dan, DO 10/19/17 1233

## 2017-10-18 NOTE — ED Notes (Signed)
ED Provider at bedside. 

## 2017-10-19 DIAGNOSIS — Z833 Family history of diabetes mellitus: Secondary | ICD-10-CM | POA: Diagnosis not present

## 2017-10-19 DIAGNOSIS — Z6841 Body Mass Index (BMI) 40.0 and over, adult: Secondary | ICD-10-CM | POA: Diagnosis present

## 2017-10-19 DIAGNOSIS — Z79899 Other long term (current) drug therapy: Secondary | ICD-10-CM | POA: Diagnosis not present

## 2017-10-19 DIAGNOSIS — R000 Tachycardia, unspecified: Secondary | ICD-10-CM | POA: Diagnosis present

## 2017-10-19 DIAGNOSIS — D696 Thrombocytopenia, unspecified: Secondary | ICD-10-CM

## 2017-10-19 DIAGNOSIS — D5700 Hb-SS disease with crisis, unspecified: Principal | ICD-10-CM

## 2017-10-19 DIAGNOSIS — Z8249 Family history of ischemic heart disease and other diseases of the circulatory system: Secondary | ICD-10-CM | POA: Diagnosis not present

## 2017-10-19 DIAGNOSIS — D72829 Elevated white blood cell count, unspecified: Secondary | ICD-10-CM | POA: Diagnosis present

## 2017-10-19 DIAGNOSIS — Z832 Family history of diseases of the blood and blood-forming organs and certain disorders involving the immune mechanism: Secondary | ICD-10-CM | POA: Diagnosis not present

## 2017-10-19 DIAGNOSIS — Z79891 Long term (current) use of opiate analgesic: Secondary | ICD-10-CM | POA: Diagnosis not present

## 2017-10-19 DIAGNOSIS — Z791 Long term (current) use of non-steroidal anti-inflammatories (NSAID): Secondary | ICD-10-CM | POA: Diagnosis not present

## 2017-10-19 LAB — PREGNANCY, URINE: PREG TEST UR: NEGATIVE

## 2017-10-19 MED ORDER — HYDROMORPHONE HCL 1 MG/ML IJ SOLN: 1.0000 mg | INTRAMUSCULAR | Status: AC

## 2017-10-19 MED ORDER — DIPHENHYDRAMINE HCL 25 MG PO CAPS: 25.0000 mg | ORAL_CAPSULE | ORAL | Status: DC | PRN

## 2017-10-19 MED ORDER — DIPHENHYDRAMINE HCL 50 MG/ML IJ SOLN: 12.5000 mg | Freq: Four times a day (QID) | INTRAMUSCULAR | Status: DC | PRN

## 2017-10-19 MED ORDER — KETOROLAC TROMETHAMINE 15 MG/ML IJ SOLN
15.0000 mg | Freq: Four times a day (QID) | INTRAMUSCULAR | Status: DC
  Administered 2017-10-19 – 2017-10-21 (×9): 15 mg via INTRAVENOUS
  Filled 2017-10-19 (×9): qty 1

## 2017-10-19 MED ORDER — SODIUM CHLORIDE 0.9 % IV SOLN
25.0000 mg | INTRAVENOUS | Status: DC | PRN
  Filled 2017-10-19: qty 0.5

## 2017-10-19 MED ORDER — ONDANSETRON HCL 4 MG/2ML IJ SOLN: 4.0000 mg | Freq: Four times a day (QID) | INTRAMUSCULAR | Status: DC | PRN

## 2017-10-19 MED ORDER — HYDROMORPHONE HCL 1 MG/ML IJ SOLN
1.0000 mg | INTRAMUSCULAR | Status: AC
  Administered 2017-10-19: 1 mg via INTRAVENOUS
  Filled 2017-10-19: qty 1

## 2017-10-19 MED ORDER — ONDANSETRON HCL 4 MG/2ML IJ SOLN
4.0000 mg | Freq: Four times a day (QID) | INTRAMUSCULAR | Status: DC | PRN
  Administered 2017-10-19: 4 mg via INTRAVENOUS
  Filled 2017-10-19: qty 2

## 2017-10-19 MED ORDER — NALOXONE HCL 0.4 MG/ML IJ SOLN: 0.4000 mg | INTRAMUSCULAR | Status: DC | PRN

## 2017-10-19 MED ORDER — DIPHENHYDRAMINE HCL 12.5 MG/5ML PO ELIX: 12.5000 mg | ORAL_SOLUTION | Freq: Four times a day (QID) | ORAL | Status: DC | PRN

## 2017-10-19 MED ORDER — POLYETHYLENE GLYCOL 3350 17 G PO PACK
17.0000 g | PACK | Freq: Every day | ORAL | Status: DC | PRN
  Administered 2017-10-19 – 2017-10-20 (×2): 17 g via ORAL
  Filled 2017-10-19 (×2): qty 1

## 2017-10-19 MED ORDER — HYDROMORPHONE 1 MG/ML IV SOLN
INTRAVENOUS | Status: DC
  Administered 2017-10-19 (×2): 5.5 mg via INTRAVENOUS
  Administered 2017-10-20: 3 mg via INTRAVENOUS
  Administered 2017-10-20: 6 mg via INTRAVENOUS
  Filled 2017-10-19: qty 25

## 2017-10-19 MED ORDER — ENOXAPARIN SODIUM 60 MG/0.6ML ~~LOC~~ SOLN
60.0000 mg | SUBCUTANEOUS | Status: DC
  Administered 2017-10-19 – 2017-10-21 (×3): 60 mg via SUBCUTANEOUS
  Filled 2017-10-19 (×3): qty 0.6

## 2017-10-19 MED ORDER — HYDROMORPHONE 1 MG/ML IV SOLN
INTRAVENOUS | Status: DC
  Administered 2017-10-19: 5.3 mg via INTRAVENOUS
  Administered 2017-10-19: 1.8 mg via INTRAVENOUS
  Administered 2017-10-19: 04:00:00 via INTRAVENOUS
  Filled 2017-10-19: qty 25

## 2017-10-19 MED ORDER — SODIUM CHLORIDE 0.9% FLUSH: 9.0000 mL | INTRAVENOUS | Status: DC | PRN

## 2017-10-19 MED ORDER — SENNOSIDES-DOCUSATE SODIUM 8.6-50 MG PO TABS
1.0000 | ORAL_TABLET | Freq: Two times a day (BID) | ORAL | Status: DC
  Administered 2017-10-19 – 2017-10-21 (×6): 1 via ORAL
  Filled 2017-10-19 (×6): qty 1

## 2017-10-19 MED ORDER — FOLIC ACID 1 MG PO TABS
1.0000 mg | ORAL_TABLET | Freq: Every day | ORAL | Status: DC
  Administered 2017-10-19 – 2017-10-21 (×3): 1 mg via ORAL
  Filled 2017-10-19 (×3): qty 1

## 2017-10-19 NOTE — Progress Notes (Signed)
Subjective:  Stephanie Burch, a 37 year old female with a medical history significant for sickle cell anemia, hemoglobin  who was admitted with the left leg pain that is consistent with typical sickle cell crisis.  Symptoms initially started 7 days ago.  Patient was treated and evaluated in the day infusion center on 10/15/2017.  Admission was offered at that time, patient declined.  Patient is primarily opiate nave.  Patient states that pain intensity has not improved overnight on full dose Dilaudid PCA.  Current pain intensity is 8/10 characterized as constant and throbbing.  Patient has been unable to get out of bed without assistance. Patient is afebrile and oxygen saturation 96% on room air.    Objective:  Vital signs in last 24 hours:  Vitals:   10/19/17 0418 10/19/17 0500 10/19/17 0542 10/19/17 0946  BP:   127/74   Pulse:   97   Resp: 17  16 16   Temp:   98.8 F (37.1 C)   TempSrc:   Oral   SpO2: 92%  96% 99%  Weight:  128.1 kg    Height:        Intake/Output from previous day:   Intake/Output Summary (Last 24 hours) at 10/19/2017 1158 Last data filed at 10/19/2017 0542 Gross per 24 hour  Intake 569.61 ml  Output -  Net 569.61 ml    Physical Exam: General: Alert, awake, oriented x3, in moderate distress. Appears uncomfortable.  HEENT: [place]/AT PEERL, EOMI Neck: Trachea midline,  no masses, no thyromegal,y no JVD, no carotid bruit OROPHARYNX:  Moist, No exudate/ erythema/lesions.  Heart: Regular rate and rhythm, without murmurs, rubs, gallops, PMI non-displaced, no heaves or thrills on palpation.  Lungs: Clear to auscultation, no wheezing or rhonchi noted. No increased vocal fremitus resonant to percussion  Abdomen: Soft, nontender, nondistended, positive bowel sounds, no masses no hepatosplenomegaly noted.. Abdominal obesity.  Neuro: No focal neurological deficits noted cranial nerves II through XII grossly intact. DTRs 2+ bilaterally upper and lower extremities. Strength 5  out of 5 in bilateral upper and lower extremities. Musculoskeletal: No warm swelling or erythema around joints, no spinal tenderness noted. Psychiatric: Patient alert and oriented x3, good insight and cognition, good recent to remote recall. Lymph node survey: No cervical axillary or inguinal lymphadenopathy noted.  Lab Results:  Basic Metabolic Panel:    Component Value Date/Time   NA 137 10/18/2017 2215   NA 142 09/11/2017 1431   K 3.6 10/18/2017 2215   CL 103 10/18/2017 2215   CO2 23 10/18/2017 2215   BUN 8 10/18/2017 2215   BUN 8 09/11/2017 1431   CREATININE 0.60 10/18/2017 2215   GLUCOSE 106 (H) 10/18/2017 2215   CALCIUM 9.1 10/18/2017 2215   CBC:    Component Value Date/Time   WBC 13.7 (H) 10/18/2017 2215   HGB 9.9 (L) 10/18/2017 2215   HGB 13.0 09/11/2017 1431   HCT 29.4 (L) 10/18/2017 2215   HCT 38.4 09/11/2017 1431   PLT 108 (L) 10/18/2017 2215   PLT 258 09/11/2017 1431   MCV 72.8 (L) 10/18/2017 2215   MCV 75 (L) 09/11/2017 1431   NEUTROABS 10.1 (H) 10/18/2017 2215   NEUTROABS 3.9 09/11/2017 1431   LYMPHSABS 2.3 10/18/2017 2215   LYMPHSABS 2.2 09/11/2017 1431   MONOABS 1.2 (H) 10/18/2017 2215   EOSABS 0.1 10/18/2017 2215   EOSABS 0.1 09/11/2017 1431   BASOSABS 0.0 10/18/2017 2215   BASOSABS 0.0 09/11/2017 1431    No results found for this or any  previous visit (from the past 240 hour(s)).  Studies/Results: Dg Chest 2 View  Result Date: 10/18/2017 CLINICAL DATA:  Patient with left leg pain. EXAM: CHEST - 2 VIEW COMPARISON:  None. FINDINGS: Monitoring leads overlie the patient. Normal cardiac scratch the cardiac contours upper limits of normal. No consolidative pulmonary opacities. No pleural effusion or pneumothorax. IMPRESSION: No acute cardiopulmonary process. Electronically Signed   By: Annia Beltrew  Davis M.D.   On: 10/18/2017 23:45    Medications: Scheduled Meds: . enoxaparin (LOVENOX) injection  60 mg Subcutaneous Q24H  . folic acid  1 mg Oral Daily  .  HYDROmorphone   Intravenous Q4H  . ketorolac  15 mg Intravenous Q6H  . senna-docusate  1 tablet Oral BID   Continuous Infusions: . sodium chloride 125 mL/hr at 10/18/17 2324   PRN Meds:.diphenhydrAMINE **OR** diphenhydrAMINE, naloxone **AND** sodium chloride flush, ondansetron (ZOFRAN) IV, polyethylene glycol  Assessment/Plan: Principal Problem:   Sickle cell anemia with crisis (HCC) Active Problems:   Leukocytosis   Thrombocytopenia (HCC)   Morbidly obese (HCC)  Sickle cell anemia, HbSC with crisis:  Continue IVF D5 .45% Saline @ 125 mls/hour, will increase dilaudid PCA dosage with 0.5 mg bolus every 2 hours as needed  IV Toradol 30 mg Q 6 H, Monitor vitals very closely, Re-evaluate pain scale regularly, 2 L of Oxygen by [place].  Sickle cell anemia:  Hemoglobin is 10.5, which is consistent with baseline, will continue to monitor.  Continue folic acid 1 mg daily  Leukocytosis:  WBC count 13.7, no infectious symptoms. Patient is afebrile. Continue to monitor closely  Thrombocytopenia:  Platelet ct 125,000, which is decreased from baseline. Will continue to monitor closely.   Morbid obesity:  BMI 44.93. Heart healthy diet   Code Status: Full Code Family Communication: N/A Disposition Plan: Not yet ready for discharge  Monae Topping Rennis PettyMoore Kalib Bhagat  APRN, MSN, FNP-C [facility] [address]  [place], [place] [address] [phone]  If 7PM-7AM, please contact night-coverage.  10/19/2017, 11:58 AM  LOS: 0 days

## 2017-10-19 NOTE — Plan of Care (Signed)
  Problem: Pain Managment: Goal: General experience of comfort will improve Outcome: Progressing   

## 2017-10-19 NOTE — H&P (Signed)
History and Physical    Stephanie Burch ZOX:[id number] DOB: 18-Jul-1980 DOA: 10/18/2017  Referring MD/NP/PA: Sharilyn Sites, PA-C PCP: Kallie Locks, FNP  Patient coming from: Home  Chief Complaint: Sickle cell pain crisis  I have personally briefly reviewed patient's old medical records in [facility]   HPI: Stephanie Burch is a 37 y.o. female with medical history significant of sickle cell anemia with hemoglobin [place]; who presents with complaints of pain in her left leg.  Symptoms initially started 7 days ago.  She describes it as an excruciating pain pain denies any recent trauma, falls, joint swelling, fever, shortness of breath, cough, abdominal pain, vomiting, or diarrhea symptoms.  Associated symptoms include some nausea.  Patient had been seen in the emergency department 4 days ago.  At that time it was recommended for her to be admitted into the hospital, but she declined after receiving initial therapies.  She was managing her pain symptoms at home with oxycodone and tizanidine up until 2 days ago when she ran out of oxycodone.  Since that time her pain is been uncontrolled.  ED Course: Upon admission into the emergency department patient was seen to have pulse up to 114, respirations 22, blood pressure 127/94 - 135/64, and O2 saturation maintained on room air.  Lab work was significant for WBC 13.7, hemoglobin 9.9, platelets 108, and reticulocyte count 315.1.  Chest x-ray showed no acute abnormalities.  Patient was started on 0.45% normal saline at 125 mL/h and received 4.5 mg of Dilaudid on the ED without relief of pain symptoms.  Review of Systems  Constitutional: Negative for chills and fever.  HENT: Negative for ear discharge and nosebleeds.   Eyes: Negative for double vision and discharge.  Respiratory: Negative for cough and shortness of breath.   Cardiovascular: Negative for chest pain and leg swelling.  Gastrointestinal: Positive for nausea. Negative for constipation and  vomiting.  Genitourinary: Negative for dysuria and frequency.  Musculoskeletal: Positive for myalgias. Negative for back pain.  Skin: Negative for itching and rash.  Neurological: Negative for focal weakness and loss of consciousness.  Psychiatric/Behavioral: Negative for substance abuse. The patient is not nervous/anxious.     Past Medical History:  Diagnosis Date  . Sickle cell anemia (HCC)     History reviewed. No pertinent surgical history.   reports that she has never smoked. She has never used smokeless tobacco. She reports that she does not drink alcohol or use drugs.  No Known Allergies  Family History  Problem Relation Age of Onset  . Hypertension Mother   . Sickle cell trait Mother   . Diabetes Father   . Sickle cell trait Father     Prior to Admission medications   Medication Sig Start Date End Date Taking? Authorizing Provider  folic acid (FOLVITE) 1 MG tablet Take 1 tablet (1 mg total) by mouth daily. 10/07/17  Yes Kallie Locks, FNP  ibuprofen (ADVIL,MOTRIN) 800 MG tablet Take 1 tablet (800 mg total) by mouth every 8 (eight) hours as needed. Patient taking differently: Take 800 mg by mouth every 8 (eight) hours as needed for headache, mild pain, moderate pain or cramping.  10/15/17  Yes Massie Maroon, FNP  ondansetron [facility]) 4 MG/2ML SOLN injection Inject 2 mLs (4 mg total) into the vein every 6 (six) hours as needed for nausea or vomiting. 10/15/17  Yes Massie Maroon, FNP  Oxycodone HCl 10 MG TABS Take 1 tablet every 4 hours for moderate to severe pain. Patient  taking differently: Take 10 mg by mouth every 4 (four) hours as needed (severe pain).  10/17/17  Yes Kallie Locks, FNP  tiZANidine (ZANAFLEX) 4 MG tablet Take 1 tablet (4 mg total) by mouth every 8 (eight) hours as needed for muscle spasms. 10/17/17  Yes Kallie Locks, FNP  Vitamin D, Ergocalciferol, (DRISDOL) 50000 units CAPS capsule Take 1 capsule (50,000 Units total) by mouth every 7  (seven) days. 09/17/17  Yes Kallie Locks, FNP    Physical Exam:  Constitutional: Obese female who appears to be in moderate distress. Vitals:   10/18/17 2205 10/19/17 0042 10/19/17 0115 10/19/17 0117  BP:  135/64 (!) 127/94 (!) 127/94  Pulse:  (!) 102 99 97  Resp:    (!) 22  SpO2:  100% 96% 100%  Weight: 122.5 kg     Height: 5\' 5"  (1.651 m)      Eyes: PERRL, lids and conjunctivae normal ENMT: Mucous membranes are moist. Posterior pharynx clear of any exudate or lesions.Normal dentition.  Neck: normal, supple, no masses, no thyromegaly Respiratory: clear to auscultation bilaterally, no wheezing, no crackles. Normal respiratory effort. No accessory muscle use.  Cardiovascular: Regular rate and rhythm, no murmurs / rubs / gallops. No extremity edema. 2+ pedal pulses. No carotid bruits.  Abdomen: no tenderness, no masses palpated. No hepatosplenomegaly. Bowel sounds positive.  Musculoskeletal: no clubbing / cyanosis. No joint deformity upper and lower extremities. Good ROM, no contractures. Normal muscle tone.  Skin: no rashes, lesions, ulcers. No induration Neurologic: CN 2-12 grossly intact. Sensation intact, DTR normal. Strength 5/5 in all 4.  Psychiatric: Normal judgment and insight. Alert and oriented x 3. Normal mood.     Labs on Admission: I have personally reviewed following labs and imaging studies  CBC: Recent Labs  Lab 10/15/17 1137 10/18/17 2215  WBC 6.4 13.7*  NEUTROABS 4.2 10.1*  HGB 10.5* 9.9*  HCT 30.3* 29.4*  MCV 70.6* 72.8*  PLT 125* 108*   Basic Metabolic Panel: Recent Labs  Lab 10/15/17 1137 10/18/17 2215  NA 144 137  K 3.9 3.6  CL 111 103  CO2 27 23  GLUCOSE 83 106*  BUN 12 8  CREATININE 0.63 0.60  CALCIUM 9.1 9.1   GFR: Estimated Creatinine Clearance: 130.1 mL/min (by C-G formula based on SCr of 0.6 mg/dL). Liver Function Tests: Recent Labs  Lab 10/15/17 1137 10/18/17 2215  AST 22 24  ALT 30 22  ALKPHOS 69 77  BILITOT 1.4* 2.0*    PROT 7.0 7.5  ALBUMIN 4.0 3.9   No results for input(s): LIPASE, AMYLASE in the last 168 hours. No results for input(s): AMMONIA in the last 168 hours. Coagulation Profile: No results for input(s): INR, PROTIME in the last 168 hours. Cardiac Enzymes: No results for input(s): CKTOTAL, CKMB, CKMBINDEX, TROPONINI in the last 168 hours. BNP (last 3 results) No results for input(s): PROBNP in the last 8760 hours. HbA1C: No results for input(s): HGBA1C in the last 72 hours. CBG: No results for input(s): GLUCAP in the last 168 hours. Lipid Profile: No results for input(s): CHOL, HDL, LDLCALC, TRIG, CHOLHDL, LDLDIRECT in the last 72 hours. Thyroid Function Tests: No results for input(s): TSH, T4TOTAL, FREET4, T3FREE, THYROIDAB in the last 72 hours. Anemia Panel: Recent Labs    10/18/17 2215  RETICCTPCT 7.8*   Urine analysis:    Component Value Date/Time   COLORURINE YELLOW 10/15/2017 1032   APPEARANCEUR CLEAR 10/15/2017 1032   LABSPEC 1.011 10/15/2017 1032   PHURINE  5.0 10/15/2017 1032   GLUCOSEU NEGATIVE 10/15/2017 1032   HGBUR SMALL (A) 10/15/2017 1032   BILIRUBINUR NEGATIVE 10/15/2017 1032   BILIRUBINUR negative 09/11/2017 1326   KETONESUR NEGATIVE 10/15/2017 1032   PROTEINUR NEGATIVE 10/15/2017 1032   UROBILINOGEN 0.2 09/11/2017 1326   NITRITE NEGATIVE 10/15/2017 1032   LEUKOCYTESUR NEGATIVE 10/15/2017 1032   Sepsis Labs: No results found for this or any previous visit (from the past 240 hour(s)).   Radiological Exams on Admission: Dg Chest 2 View  Result Date: 10/18/2017 CLINICAL DATA:  Patient with left leg pain. EXAM: CHEST - 2 VIEW COMPARISON:  None. FINDINGS: Monitoring leads overlie the patient. Normal cardiac scratch the cardiac contours upper limits of normal. No consolidative pulmonary opacities. No pleural effusion or pneumothorax. IMPRESSION: No acute cardiopulmonary process. Electronically Signed   By: Annia Beltrew  Davis M.D.   On: 10/18/2017 23:45    EKG:  Independently reviewed.  Sinus tachycardia at 114 bpm with right axis deviation  Assessment/Plan Sickle cell anemia with pain crisis: Patient complains of pain in the left leg for which medications of oxycodone and tizanidine provided relief up until she ran out over the weekend.  Hemoglobin 9.9 which appears clear relatively near patient's baseline. - Sickle cell admission order set  initiated - 0.45% normal saline at 125 ml/hr - Benadryl IV prn itching - Antiemetics as needed - IV Toradol  - Continue folic acid - Opioid nave Dilaudid PCA pump per protocol(Dilaudid 0.5mg  bolus with 8-minute interval lockout and 1.25 mg 1 hour limit)   Leukocytosis: WBC elevated 13.7.  Patient denies any infectious symptoms. - Recheck CBC in a.m.  Thrombocytopenia: Acute.  Patient presents with a platelet count of 108 on admission previously noted to be 125 4 days ago. - Follow-up repeat CBC in a.m.  Morbid obesity: BMI 44.93.  DVT prophylaxis: Lovenox, unless platelet count continues to decrease Code Status: Full Family Communication: none   Disposition Plan: Likely discharge home in 1 to 2 days Consults called: none  Admission status: observation   Clydie Braunondell A Smith MD [facility] Pager [phone]   If 7PM-7AM, please contact night-coverage [email or web address] Password [email or web address]  10/19/2017, 3:14 AM

## 2017-10-19 NOTE — ED Notes (Signed)
ED Provider at bedside. 

## 2017-10-19 NOTE — Progress Notes (Signed)
Lovenox per Pharmacy for DVT Prophylaxis    Pharmacy has been consulted from dosing enoxaparin (lovenox) in this patient for DVT prophylaxis.  The pharmacist has reviewed pertinent labs (Hgb __9.9_; PLT_108__), patient weight (_122__kg) and renal function (CrCl_>90__mL/min) and decided that enoxaparin 60__mg SQ Q24Hrs is appropriate for this patient.  The pharmacy department will sign off at this time.  Please reconsult pharmacy if status changes or for further issues.  Thank you  Luetta NuttingJulian Crowford Lanah Steines, Jr PharmD, BCPS  10/19/2017, 4:31 AM

## 2017-10-20 DIAGNOSIS — R000 Tachycardia, unspecified: Secondary | ICD-10-CM

## 2017-10-20 MED ORDER — METOPROLOL SUCCINATE ER 25 MG PO TB24
12.5000 mg | ORAL_TABLET | Freq: Every day | ORAL | Status: DC
  Administered 2017-10-21: 12.5 mg via ORAL
  Filled 2017-10-20 (×2): qty 1

## 2017-10-20 MED ORDER — OXYCODONE HCL 5 MG PO TABS
10.0000 mg | ORAL_TABLET | ORAL | Status: DC | PRN
  Administered 2017-10-20 – 2017-10-21 (×5): 10 mg via ORAL
  Filled 2017-10-20 (×5): qty 2

## 2017-10-20 MED ORDER — HYDROMORPHONE 1 MG/ML IV SOLN
INTRAVENOUS | Status: DC
  Administered 2017-10-20: 21:00:00 via INTRAVENOUS
  Administered 2017-10-20: 0.9 mg via INTRAVENOUS
  Administered 2017-10-20: 1.8 mg via INTRAVENOUS
  Administered 2017-10-21 (×3): 0.3 mg via INTRAVENOUS
  Filled 2017-10-20: qty 25

## 2017-10-20 NOTE — Progress Notes (Signed)
Subjective:  Stephanie Burch, a 37 year old female with a medical history significant for sickle cell anemia, hemoglobin [place] who was admitted with the left leg pain that is consistent with typical sickle cell crisis. Patient says that pain has improved some overnight. Pain is primarily to left lower extremity. Current pain intensity is 7/10 characterized as constant and throbbing.  Patient has been unable to get out of bed without assistance.    Objective:  Vital signs in last 24 hours:  Vitals:   10/20/17 0000 10/20/17 0359 10/20/17 0524 10/20/17 1028  BP:   132/78   Pulse:   93   Resp: 16 18 17 13   Temp:   98.7 F (37.1 C)   TempSrc:   Oral   SpO2: 100% 100% 100% 97%  Weight:      Height:        Intake/Output from previous day:   Intake/Output Summary (Last 24 hours) at 10/20/2017 1204 Last data filed at 10/20/2017 0500 Gross per 24 hour  Intake 3130.37 ml  Output -  Net 3130.37 ml    Physical Exam: General: Alert, awake, oriented x3, in no distress.  HEENT: [place]/AT PEERL, EOMI Neck: Trachea midline,  no masses, no thyromegal,y no JVD, no carotid bruit OROPHARYNX:  Moist, No exudate/ erythema/lesions.  Heart: Regular rate and rhythm, without murmurs, rubs, gallops, PMI non-displaced, no heaves or thrills on palpation.  Lungs: Clear to auscultation, no wheezing or rhonchi noted. No increased vocal fremitus resonant to percussion  Abdomen: Soft, nontender, nondistended, positive bowel sounds, no masses no hepatosplenomegaly noted.. Abdominal obesity.  Neuro: No focal neurological deficits noted cranial nerves II through XII grossly intact. DTRs 2+ bilaterally upper and lower extremities. Strength 5 out of 5 in bilateral upper and lower extremities. Musculoskeletal: No warm swelling or erythema around joints, no spinal tenderness noted. Psychiatric: Patient alert and oriented x3, good insight and cognition, good recent to remote recall. Lymph node survey: No cervical axillary or  inguinal lymphadenopathy noted.  Lab Results:  Basic Metabolic Panel:    Component Value Date/Time   NA 137 10/18/2017 2215   NA 142 09/11/2017 1431   K 3.6 10/18/2017 2215   CL 103 10/18/2017 2215   CO2 23 10/18/2017 2215   BUN 8 10/18/2017 2215   BUN 8 09/11/2017 1431   CREATININE 0.60 10/18/2017 2215   GLUCOSE 106 (H) 10/18/2017 2215   CALCIUM 9.1 10/18/2017 2215   CBC:    Component Value Date/Time   WBC 13.7 (H) 10/18/2017 2215   HGB 9.9 (L) 10/18/2017 2215   HGB 13.0 09/11/2017 1431   HCT 29.4 (L) 10/18/2017 2215   HCT 38.4 09/11/2017 1431   PLT 108 (L) 10/18/2017 2215   PLT 258 09/11/2017 1431   MCV 72.8 (L) 10/18/2017 2215   MCV 75 (L) 09/11/2017 1431   NEUTROABS 10.1 (H) 10/18/2017 2215   NEUTROABS 3.9 09/11/2017 1431   LYMPHSABS 2.3 10/18/2017 2215   LYMPHSABS 2.2 09/11/2017 1431   MONOABS 1.2 (H) 10/18/2017 2215   EOSABS 0.1 10/18/2017 2215   EOSABS 0.1 09/11/2017 1431   BASOSABS 0.0 10/18/2017 2215   BASOSABS 0.0 09/11/2017 1431    No results found for this or any previous visit (from the past 240 hour(s)).  Studies/Results: Dg Chest 2 View  Result Date: 10/18/2017 CLINICAL DATA:  Patient with left leg pain. EXAM: CHEST - 2 VIEW COMPARISON:  None. FINDINGS: Monitoring leads overlie the patient. Normal cardiac scratch the cardiac contours upper limits of normal.  No consolidative pulmonary opacities. No pleural effusion or pneumothorax. IMPRESSION: No acute cardiopulmonary process. Electronically Signed   By: Annia Beltrew  Davis M.D.   On: 10/18/2017 23:45    Medications: Scheduled Meds: . enoxaparin (LOVENOX) injection  60 mg Subcutaneous Q24H  . folic acid  1 mg Oral Daily  . HYDROmorphone   Intravenous Q4H  . ketorolac  15 mg Intravenous Q6H  . metoprolol succinate  12.5 mg Oral Daily  . senna-docusate  1 tablet Oral BID   Continuous Infusions: . sodium chloride 75 mL/hr at 10/20/17 1033  . diphenhydrAMINE     PRN Meds:.diphenhydrAMINE **OR**  diphenhydrAMINE, naloxone **AND** sodium chloride flush, ondansetron (ZOFRAN) IV, oxyCODONE, polyethylene glycol  Assessment/Plan: Principal Problem:   Sickle cell anemia with crisis (HCC) Active Problems:   Leukocytosis   Thrombocytopenia (HCC)   Morbidly obese (HCC)  Sickle cell anemia, HbSC with crisis:  Continue IVF D5 .45% Saline @ 50 mls/hour, will start to wean dilaudid PCA and discontinue 0.5 mg boluses as needed.   Continue IV Toradol 30 mg Q 6 H, Monitor vitals very closely, Re-evaluate pain scale regularly, 2 L of Oxygen by .  Sickle cell anemia:  Hemoglobin is 10.5, which is consistent with baseline, will continue to monitor.  Continue folic acid 1 mg daily  Leukocytosis:  WBC count 13.7, no infectious symptoms. Patient is afebrile. Continue to monitor closely. Repeat CBC in am  Thrombocytopenia:  Platelet ct 125,000, which is decreased from baseline. Will continue to monitor closely.   Morbid obesity:  BMI 44.93. Heart healthy diet  Tachycardia:  Heart rate consistently in 110s-130s. Metoprolol XL 12.5 mg daily.    Code Status: Full Code Family Communication: N/A Disposition Plan: Not yet ready for discharge  Vega Stare Rennis PettyMoore Briella Hobday  APRN, MSN, FNP-C [facility] [address]  [place], [place] [address] [phone]  If 7PM-7AM, please contact night-coverage.  10/20/2017, 12:04 PM  LOS: 1 day

## 2017-10-21 DIAGNOSIS — R000 Tachycardia, unspecified: Secondary | ICD-10-CM

## 2017-10-21 NOTE — Progress Notes (Signed)
Patient A&Ox4, ambulatory without assistance. Discharge instructions reviewed, questions concerns denied.

## 2017-10-21 NOTE — Discharge Instructions (Signed)
Sickle Cell Anemia, Adult °Sickle cell anemia is a condition where your red blood cells are shaped like sickles. Red blood cells carry oxygen through the body. Sickle-shaped red blood cells do not live as long as normal red blood cells. They also clump together and block blood from flowing through the blood vessels. These things prevent the body from getting enough oxygen. Sickle cell anemia causes organ damage and pain. It also increases the risk of infection. °Follow these instructions at home: °· Drink enough fluid to keep your pee (urine) clear or pale yellow. Drink more in hot weather and during exercise. °· Do not smoke. Smoking lowers oxygen levels in the blood. °· Only take over-the-counter or prescription medicines as told by your doctor. °· Take antibiotic medicines as told by your doctor. Make sure you finish them even if you start to feel better. °· Take supplements as told by your doctor. °· Consider wearing a medical alert bracelet. This tells anyone caring for you in an emergency of your condition. °· When traveling, keep your medical information, doctors' names, and the medicines you take with you at all times. °· If you have a fever, do not take fever medicines right away. This could cover up a problem. Tell your doctor. °· Keep all follow-up visits with your doctor. Sickle cell anemia requires regular medical care. °Contact a doctor if: °You have a fever. °Get help right away if: °· You feel dizzy or faint. °· You have new belly (abdominal) pain, especially on the left side near the stomach area. °· You have a lasting, often uncomfortable and painful erection of the penis (priapism). If it is not treated right away, you will become unable to have sex (impotence). °· You have numbness in your arms or legs or you have a hard time moving them. °· You have a hard time talking. °· You have a fever or lasting symptoms for more than 2-3 days. °· You have a fever and your symptoms suddenly get  worse. °· You have signs or symptoms of infection. These include: °? Chills. °? Being more tired than normal (lethargy). °? Irritability. °? Poor eating. °? Throwing up (vomiting). °· You have pain that is not helped with medicine. °· You have shortness of breath. °· You have pain in your chest. °· You are coughing up pus-like or bloody mucus. °· You have a stiff neck. °· Your feet or hands swell or have pain. °· Your belly looks bloated. °· Your joints hurt. °This information is not intended to replace advice given to you by your health care provider. Make sure you discuss any questions you have with your health care provider. °Document Released: 11/10/2012 Document Revised: 06/28/2015 Document Reviewed: 09/01/2012 °Elsevier Interactive Patient Education © 2017 [employer] ° °

## 2017-10-21 NOTE — Discharge Summary (Addendum)
30 minutes with this Physician Discharge Summary  Stephanie Burch:[id number] DOB: 1980/03/30 DOA: 10/18/2017  PCP: Kallie Locks, FNP  Admit date: 10/18/2017  Discharge date: 10/21/2017  Discharge Diagnoses:  Principal Problem:   Sickle cell anemia with crisis [name]) Active Problems:   Leukocytosis   Thrombocytopenia (HCC)   Morbidly obese (HCC)   Tachycardia with heart rate 100-120 beats per minute   Discharge Condition: Stable  Disposition:  Follow-up Information    Kallie Locks, FNP Follow up in 1 week(s).   Specialty:  Family Medicine Why:  Medication managment. Repeat CBC and CMP Contact information: [address] [place] [address] [phone]          Pt is discharged home in good condition and is to follow up with Kallie Locks, FNP this week to have labs evaluated. Stephanie Burch is instructed to increase activity slowly and balance with rest for the next few days, and use prescribed medication to complete treatment of pain  Diet: Regular Wt Readings from Last 3 Encounters:  10/19/17 128.1 kg  10/15/17 130.4 kg  09/11/17 124.3 kg    History of present illness:  Stephanie Burch, a 37 year old female with a medical history significant for sickle cell anemia, hemoglobin [place] who presented with complaints of pain primarily to left leg.  Symptoms initially started 7 days ago.  Patient characterizes symptoms as excruciating and denies recent trauma, falls, joint swelling, fever, shortness of breath, cough, abdominal pain, vomiting, or diarrhea.  Patient was treated and evaluated in the emergency department and in the day infusion center prior to admission.  At day infusion center, it was recommended for patient to be admitted for sickle cell pain crisis, but she declined after initial therapy.  Patient attempted to manage pain symptoms at home with oxycodone, tizanidine, and ibuprofen until 2 days ago when patient ran out of oxycodone.  Since that time,  the pain is uncontrolled.  Emergency room course: Upon admission to the emergency department, patient had elevated pulse rates up to 114.  All other vital signs unremarkable.  Oxygen saturation maintained on room air.  WBC count mildly elevated to 13.7, hemoglobin 9.9, which is consistent with baseline. Chest x-ray showed no acute abnormalities.  Patient was treated with IV fluids, Toradol, and IV Dilaudid without relief.  Patient was admitted for sickle cell pain crisis.  Hospital Course:  Sickle cell pain crisis:  Patient was admitted for sickle cell pain crisis and managed appropriately with IVF, IV Dilaudid via PCA and IV Toradol, as well as other adjunct therapies per sickle cell pain management protocols.  Dilaudid PCA was weaned appropriately and patient's pain was controlled on oxycodone 10 mg every 4 hours as needed.  Patient was advised to continue oxycodone 10 mg every 4 hours as needed and follow-up in primary care in 1 week.  Current pain intensity 5/10 at discharge.  Tachycardia with heart rate between 100-120.  A course of metoprolol 12.5 mg extended release was initiated for tachycardia.  Heart rate improved.  Patient's EKG, sinus tachycardia.  Patient asymptomatic.  Scheduled to follow-up in primary care in 1 week.  Patient was discharged home today in a hemodynamically stable condition.   Discharge Exam: Vitals:   10/21/17 0831 10/21/17 0916  BP:  134/75  Pulse:  98  Resp: 20 18  Temp:  99.5 F (37.5 C)  SpO2: 99% 96%   Vitals:   10/21/17 0400 10/21/17 0556 10/21/17 0831 10/21/17 0916  BP:  126/74  134/75  Pulse:  89  98  Resp: 18 17 20 18   Temp:  98.8 F (37.1 C)  99.5 F (37.5 C)  TempSrc:  Oral  Oral  SpO2: 98% 95% 99% 96%  Weight:      Height:        General appearance : Awake, alert, not in any distress. Speech Clear. Not toxic looking HEENT: Atraumatic and Normocephalic, pupils equally reactive to light and accomodation Neck: Supple, no JVD. No  cervical lymphadenopathy.  Chest: Good air entry bilaterally, no added sounds  CVS: S1 S2 regular, no murmurs.  Abdomen: Bowel sounds present, Non tender and not distended with no gaurding, rigidity or rebound. Extremities: B/L Lower Ext shows no edema, both legs are warm to touch Neurology: Awake alert, and oriented X 3, CN II-XII intact, Non focal Skin: No Rash  Discharge Instructions  Discharge Instructions    Discharge patient   Complete by:  As directed    Discharge disposition:  01-Home or Self Care   Discharge patient date:  10/21/2017     Allergies as of 10/21/2017   No Known Allergies     Medication List    TAKE these medications   folic acid 1 MG tablet Commonly known as:  FOLVITE Take 1 tablet (1 mg total) by mouth daily.   ibuprofen 800 MG tablet Commonly known as:  ADVIL,MOTRIN Take 1 tablet (800 mg total) by mouth every 8 (eight) hours as needed. What changed:  reasons to take this   ondansetron 4 MG/2ML Soln injection Commonly known as:  ZOFRAN Inject 2 mLs (4 mg total) into the vein every 6 (six) hours as needed for nausea or vomiting.   Oxycodone HCl 10 MG Tabs Take 1 tablet every 4 hours for moderate to severe pain. What changed:    how much to take  how to take this  when to take this  reasons to take this  additional instructions   tiZANidine 4 MG tablet Commonly known as:  ZANAFLEX Take 1 tablet (4 mg total) by mouth every 8 (eight) hours as needed for muscle spasms.   Vitamin D (Ergocalciferol) 50000 units Caps capsule Commonly known as:  DRISDOL Take 1 capsule (50,000 Units total) by mouth every 7 (seven) days.       The results of significant diagnostics from this hospitalization (including imaging, microbiology, ancillary and laboratory) are listed below for reference.    Significant Diagnostic Studies: Dg Chest 2 View  Result Date: 10/18/2017 CLINICAL DATA:  Patient with left leg pain. EXAM: CHEST - 2 VIEW COMPARISON:  None.  FINDINGS: Monitoring leads overlie the patient. Normal cardiac scratch the cardiac contours upper limits of normal. No consolidative pulmonary opacities. No pleural effusion or pneumothorax. IMPRESSION: No acute cardiopulmonary process. Electronically Signed   By: Annia Belt M.D.   On: 10/18/2017 23:45    Microbiology: No results found for this or any previous visit (from the past 240 hour(s)).   Labs: Basic Metabolic Panel: Recent Labs  Lab 10/15/17 1137 10/18/17 2215  NA 144 137  K 3.9 3.6  CL 111 103  CO2 27 23  GLUCOSE 83 106*  BUN 12 8  CREATININE 0.63 0.60  CALCIUM 9.1 9.1   Liver Function Tests: Recent Labs  Lab 10/15/17 1137 10/18/17 2215  AST 22 24  ALT 30 22  ALKPHOS 69 77  BILITOT 1.4* 2.0*  PROT 7.0 7.5  ALBUMIN 4.0 3.9   No results for input(s): LIPASE, AMYLASE in the last  168 hours. No results for input(s): AMMONIA in the last 168 hours. CBC: Recent Labs  Lab 10/15/17 1137 10/18/17 2215  WBC 6.4 13.7*  NEUTROABS 4.2 10.1*  HGB 10.5* 9.9*  HCT 30.3* 29.4*  MCV 70.6* 72.8*  PLT 125* 108*   Cardiac Enzymes: No results for input(s): CKTOTAL, CKMB, CKMBINDEX, TROPONINI in the last 168 hours. BNP: Invalid input(s): POCBNP CBG: No results for input(s): GLUCAP in the last 168 hours.  Time coordinating discharge: 30 minutes  Signed:  Nolon NationsLachina Moore Crit Obremski  APRN, MSN, FNP-C [facility] [address]  [place], [place] [id number] [phone]  [facility] 10/21/2017, 10:49 AM

## 2017-10-21 NOTE — Progress Notes (Signed)
Dilaudid PCA 24 mg discontinued and wasted with  Tama GanderSophia Pickett.

## 2017-10-28 ENCOUNTER — Telehealth: Payer: Self-pay

## 2017-10-28 NOTE — Telephone Encounter (Signed)
CAY 

## 2017-10-29 NOTE — Telephone Encounter (Signed)
Left a vm for patient to callback. PA has been submitted for the new pharmacy

## 2017-10-29 NOTE — Telephone Encounter (Signed)
Patient notified and will call in scripts on Monday

## 2017-10-30 ENCOUNTER — Telehealth: Payer: Self-pay

## 2017-11-02 ENCOUNTER — Other Ambulatory Visit: Payer: Self-pay | Admitting: Family Medicine

## 2017-11-02 DIAGNOSIS — M62838 Other muscle spasm: Secondary | ICD-10-CM

## 2017-11-02 DIAGNOSIS — D571 Sickle-cell disease without crisis: Secondary | ICD-10-CM

## 2017-11-02 MED ORDER — OXYCODONE HCL 10 MG PO TABS: ORAL_TABLET | ORAL | 0 refills | Status: DC

## 2017-11-02 NOTE — Progress Notes (Signed)
Rx for Oxycodone sent to pharmacy today.  

## 2017-11-06 ENCOUNTER — Other Ambulatory Visit: Payer: Self-pay | Admitting: Family Medicine

## 2017-11-06 DIAGNOSIS — M62838 Other muscle spasm: Secondary | ICD-10-CM

## 2017-11-06 MED ORDER — TIZANIDINE HCL 4 MG PO TABS: ORAL_TABLET | ORAL | 2 refills | Status: DC

## 2017-11-06 NOTE — Progress Notes (Signed)
Rx for Zanaflex to pharmacy today.

## 2017-11-11 ENCOUNTER — Ambulatory Visit: Payer: Medicaid Other | Admitting: Family Medicine

## 2017-11-13 ENCOUNTER — Other Ambulatory Visit: Payer: Self-pay | Admitting: Family Medicine

## 2017-11-13 ENCOUNTER — Other Ambulatory Visit: Payer: Self-pay

## 2017-11-13 DIAGNOSIS — M62838 Other muscle spasm: Secondary | ICD-10-CM

## 2017-11-13 DIAGNOSIS — D571 Sickle-cell disease without crisis: Secondary | ICD-10-CM

## 2017-11-13 MED ORDER — OXYCODONE HCL 10 MG PO TABS: ORAL_TABLET | ORAL | 0 refills | Status: DC

## 2017-11-13 MED ORDER — IBUPROFEN 800 MG PO TABS: 800.0000 mg | ORAL_TABLET | Freq: Three times a day (TID) | ORAL | 0 refills | Status: DC | PRN

## 2017-11-13 NOTE — Progress Notes (Signed)
Rx for Oxy-IR and Motrin sent to pharmacy today. Oxy-IR not to be refilled prior to 11/16/2017.

## 2017-11-13 NOTE — Telephone Encounter (Signed)
Patient states that her medication is due on Sunday and wanted to know if it can be sent in today.

## 2017-11-20 ENCOUNTER — Ambulatory Visit: Payer: Medicaid Other | Admitting: Family Medicine

## 2017-11-24 ENCOUNTER — Telehealth: Payer: Self-pay

## 2017-11-24 NOTE — Telephone Encounter (Signed)
Called to verify appointment for 11/25/2017. NO answer left a message of appointment time and if unable to get to call back to reschedule. Thanks!  

## 2017-11-25 ENCOUNTER — Ambulatory Visit: Payer: Medicaid Other | Admitting: Family Medicine

## 2017-12-01 ENCOUNTER — Telehealth: Payer: Self-pay

## 2017-12-01 NOTE — Telephone Encounter (Signed)
Called, no answer. Left a message for patient to remind of appointment for 12/02/2017 and if unable to come to please call and reschedule. Thanks!  

## 2017-12-02 ENCOUNTER — Ambulatory Visit (INDEPENDENT_AMBULATORY_CARE_PROVIDER_SITE_OTHER): Payer: Self-pay | Admitting: Family Medicine

## 2017-12-02 ENCOUNTER — Encounter: Payer: Self-pay | Admitting: Family Medicine

## 2017-12-02 VITALS — BP 130/76 | HR 90 | Temp 97.9°F | Ht 65.0 in | Wt 271.0 lb

## 2017-12-02 DIAGNOSIS — F1190 Opioid use, unspecified, uncomplicated: Secondary | ICD-10-CM

## 2017-12-02 DIAGNOSIS — D571 Sickle-cell disease without crisis: Secondary | ICD-10-CM

## 2017-12-02 DIAGNOSIS — M62838 Other muscle spasm: Secondary | ICD-10-CM

## 2017-12-02 DIAGNOSIS — G894 Chronic pain syndrome: Secondary | ICD-10-CM

## 2017-12-02 DIAGNOSIS — Z09 Encounter for follow-up examination after completed treatment for conditions other than malignant neoplasm: Secondary | ICD-10-CM

## 2017-12-02 DIAGNOSIS — E559 Vitamin D deficiency, unspecified: Secondary | ICD-10-CM

## 2017-12-02 LAB — POCT URINALYSIS DIP (MANUAL ENTRY)
Bilirubin, UA: NEGATIVE
Blood, UA: NEGATIVE
Glucose, UA: NEGATIVE mg/dL
Leukocytes, UA: NEGATIVE
Nitrite, UA: NEGATIVE
Protein Ur, POC: NEGATIVE mg/dL
Spec Grav, UA: 1.015 (ref 1.010–1.025)
Urobilinogen, UA: 0.2 E.U./dL
pH, UA: 6.5 (ref 5.0–8.0)

## 2017-12-02 MED ORDER — OXYCODONE HCL 10 MG PO TABS: ORAL_TABLET | ORAL | 0 refills | Status: DC

## 2017-12-02 MED ORDER — VITAMIN D (ERGOCALCIFEROL) 1.25 MG (50000 UNIT) PO CAPS: 50000.0000 [IU] | ORAL_CAPSULE | ORAL | 2 refills | Status: DC

## 2017-12-02 MED ORDER — IBUPROFEN 800 MG PO TABS: 800.0000 mg | ORAL_TABLET | Freq: Three times a day (TID) | ORAL | 3 refills | Status: DC | PRN

## 2017-12-02 NOTE — Progress Notes (Signed)
Sickle Cell Anemia Follow Up  Subjective:    Patient ID: Stephanie Burch, female    DOB: [birth year], 37 y.o.   MRN: [id number]   Chief Complaint  Patient presents with  . Follow-up    sickle cell   HPI  Stephanie Burch is a 37 year old female with a past medical history of Sickle Cell Anemia. She is here today for follow up and assessment.   Current Status: Since her last office visit, she is doing well with no complaints. She states that she has pain in her right leg. She rates her pain today at 5/10. She has not has a hospital visit for Sickle Cell Crisis since 10/18/2017 where she was treated and discharged on 10/21/2017. She is currently taking all medications as prescribed and staying well hydrated. She reports occasional dizziness and headaches.   She denies fevers, chills, fatigue, recent infections, weight loss, and night sweats. She has not had any visual changes, and falls. No reports of GI problems such as nausea, vomiting, diarrhea, and constipation. She has no reports of blood in stools, dysuria and hematuria. No depression or anxiety reported.   Past Medical History:  Diagnosis Date  . Sickle cell anemia (HCC)     Family History  Problem Relation Age of Onset  . Hypertension Mother   . Sickle cell trait Mother   . Diabetes Father   . Sickle cell trait Father     Social History   Socioeconomic History  . Marital status: Single    Spouse name: Not on file  . Number of children: Not on file  . Years of education: Not on file  . Highest education level: Not on file  Occupational History  . Not on file  Social Needs  . Financial resource strain: Not on file  . Food insecurity:    Worry: Not on file    Inability: Not on file  . Transportation needs:    Medical: Not on file    Non-medical: Not on file  Tobacco Use  . Smoking status: Never Smoker  . Smokeless tobacco: Never Used  Substance and Sexual Activity  . Alcohol use: Never    Frequency: Never  . Drug use:  Never  . Sexual activity: Yes    Birth control/protection: None  Lifestyle  . Physical activity:    Days per week: Not on file    Minutes per session: Not on file  . Stress: Not on file  Relationships  . Social connections:    Talks on phone: Not on file    Gets together: Not on file    Attends religious service: Not on file    Active member of club or organization: Not on file    Attends meetings of clubs or organizations: Not on file    Relationship status: Not on file  . Intimate partner violence:    Fear of current or ex partner: Not on file    Emotionally abused: Not on file    Physically abused: Not on file    Forced sexual activity: Not on file  Other Topics Concern  . Not on file  Social History Narrative  . Not on file    History reviewed. No pertinent surgical history.   There is no immunization history on file for this patient.  Current Meds  Medication Sig  . folic acid (FOLVITE) 1 MG tablet Take 1 tablet (1 mg total) by mouth daily.  Marland Kitchen ibuprofen (ADVIL,MOTRIN) 800 MG tablet Take 1  tablet (800 mg total) by mouth every 8 (eight) hours as needed for headache, mild pain, moderate pain or cramping.  . Oxycodone HCl 10 MG TABS Take 1 tablet every 4 hours for moderate to severe pain.  Marland Kitchen tiZANidine (ZANAFLEX) 4 MG tablet Take 1 tablet every 4 hours as needed.  . Vitamin D, Ergocalciferol, (DRISDOL) 50000 units CAPS capsule Take 1 capsule (50,000 Units total) by mouth every 7 (seven) days.  . [DISCONTINUED] ibuprofen (ADVIL,MOTRIN) 800 MG tablet Take 1 tablet (800 mg total) by mouth every 8 (eight) hours as needed for headache, mild pain, moderate pain or cramping.  . [DISCONTINUED] Oxycodone HCl 10 MG TABS Take 1 tablet every 4 hours for moderate to severe pain.  . [DISCONTINUED] Vitamin D, Ergocalciferol, (DRISDOL) 50000 units CAPS capsule Take 1 capsule (50,000 Units total) by mouth every 7 (seven) days.    No Known Allergies   BP 130/76 (BP Location: Left Arm,  Patient Position: Sitting, Cuff Size: Large)   Pulse 90   Temp 97.9 F (36.6 C) (Oral)   Ht 5\' 5"  (1.651 m)   Wt 271 lb (122.9 kg)   LMP 11/15/2017   SpO2 100%   BMI 45.10 kg/m     Review of Systems  Constitutional: Negative.   HENT: Negative.   Respiratory: Negative.   Cardiovascular: Negative.   Gastrointestinal: Positive for abdominal distention (Obese).  Genitourinary: Negative.   Musculoskeletal: Positive for arthralgias (leg pain).  Skin: Negative.   Neurological: Positive for dizziness and headaches.  Psychiatric/Behavioral: Negative.    Objective:   Physical Exam  Constitutional: She is oriented to person, place, and time. She appears well-developed and well-nourished.  HENT:  Head: Normocephalic and atraumatic.  Neck: Normal range of motion. Neck supple.  Cardiovascular: Normal rate, regular rhythm, normal heart sounds and intact distal pulses.  Pulmonary/Chest: Effort normal and breath sounds normal.  Abdominal: Soft. Bowel sounds are normal.  Musculoskeletal: Normal range of motion.  Neurological: She is alert and oriented to person, place, and time.  Skin: Skin is warm and dry.  Psychiatric: She has a normal mood and affect. Her behavior is normal. Judgment and thought content normal.  Nursing note and vitals reviewed.  Assessment & Plan:   1. Hb-SS disease without crisis [facility]) She is doing well today. She continues to have chronic leg pain, which is well controlled today. She will continue to take pain medications as prescribed; will continue to avoid extreme heat and cold; will continue to eat a healthy diet and drink at least 64 ounces of water daily; continue stool softener as needed; will avoid colds and flu; will continue to get plenty of sleep and rest; will continue to avoid high stressful situations and remain infection free; will continue Folic Acid 1 mg daily to avoid sickle cell crisis.  - POCT urinalysis dipstick - ibuprofen (ADVIL,MOTRIN) 800 MG  tablet; Take 1 tablet (800 mg total) by mouth every 8 (eight) hours as needed for headache, mild pain, moderate pain or cramping.  Dispense: 30 tablet; Refill: 3 - Oxycodone HCl 10 MG TABS; Take 1 tablet every 4 hours for moderate to severe pain.  Dispense: 90 tablet; Refill: 0  2. Muscle spasms of both lower extremities - ibuprofen (ADVIL,MOTRIN) 800 MG tablet; Take 1 tablet (800 mg total) by mouth every 8 (eight) hours as needed for headache, mild pain, moderate pain or cramping.  Dispense: 30 tablet; Refill: 3 - Oxycodone HCl 10 MG TABS; Take 1 tablet every 4 hours for moderate  to severe pain.  Dispense: 90 tablet; Refill: 0  3. Chronic pain syndrome  4. Chronic, continuous use of opioids  5. Vitamin D deficiency Refill Vitamin D today. Monitor.  - Vitamin D, Ergocalciferol, (DRISDOL) 50000 units CAPS capsule; Take 1 capsule (50,000 Units total) by mouth every 7 (seven) days.  Dispense: 5 capsule; Refill: 2  6. Follow up She will follow up in 2 months.   Meds ordered this encounter  Medications  . ibuprofen (ADVIL,MOTRIN) 800 MG tablet    Sig: Take 1 tablet (800 mg total) by mouth every 8 (eight) hours as needed for headache, mild pain, moderate pain or cramping.    Dispense:  30 tablet    Refill:  3  . Oxycodone HCl 10 MG TABS    Sig: Take 1 tablet every 4 hours for moderate to severe pain.    Dispense:  90 tablet    Refill:  0    Please do not refill medication prior to 11/16/2017.    Order Specific Question:   Supervising Provider    Answer:   Quentin Angst [id number]  . Vitamin D, Ergocalciferol, (DRISDOL) 50000 units CAPS capsule    Sig: Take 1 capsule (50,000 Units total) by mouth every 7 (seven) days.    Dispense:  5 capsule    Refill:  2    [name],  MSN, FNP-C [facility] [address] [place], [place] [address] [phone]

## 2017-12-09 ENCOUNTER — Other Ambulatory Visit: Payer: Self-pay | Admitting: Family Medicine

## 2017-12-09 DIAGNOSIS — M62838 Other muscle spasm: Secondary | ICD-10-CM

## 2017-12-13 ENCOUNTER — Other Ambulatory Visit: Payer: Self-pay | Admitting: Family Medicine

## 2017-12-13 DIAGNOSIS — D571 Sickle-cell disease without crisis: Secondary | ICD-10-CM

## 2017-12-13 DIAGNOSIS — M62838 Other muscle spasm: Secondary | ICD-10-CM

## 2017-12-14 ENCOUNTER — Telehealth: Payer: Self-pay

## 2017-12-14 ENCOUNTER — Other Ambulatory Visit: Payer: Self-pay | Admitting: Family Medicine

## 2017-12-14 DIAGNOSIS — M62838 Other muscle spasm: Secondary | ICD-10-CM

## 2017-12-14 DIAGNOSIS — D571 Sickle-cell disease without crisis: Secondary | ICD-10-CM

## 2017-12-14 MED ORDER — OXYCODONE HCL 10 MG PO TABS: ORAL_TABLET | ORAL | 0 refills | Status: DC

## 2017-12-14 MED ORDER — TIZANIDINE HCL 4 MG PO TABS: ORAL_TABLET | ORAL | 2 refills | Status: DC

## 2017-12-14 NOTE — Progress Notes (Signed)
Rx for Zanaflex and Oxycodone sent to pharmacy today.

## 2017-12-22 ENCOUNTER — Telehealth: Payer: Self-pay

## 2017-12-22 NOTE — Telephone Encounter (Signed)
Patient would like to know if she could change her Tizanidine to Flexeril because medicaid will pay for it. Also patient would like to know some more about a new sickle cell drug name Crizanlizumab.

## 2017-12-22 NOTE — Telephone Encounter (Signed)
I will contact patient. Thanks.

## 2017-12-24 NOTE — Telephone Encounter (Signed)
Thank you. I will contact patient at this number.

## 2017-12-24 NOTE — Telephone Encounter (Signed)
Patient called today inquiring about the status of this request.  She can be reached at [phone].  Thanks

## 2017-12-25 ENCOUNTER — Telehealth: Payer: Self-pay

## 2017-12-25 ENCOUNTER — Other Ambulatory Visit: Payer: Self-pay

## 2017-12-25 ENCOUNTER — Other Ambulatory Visit: Payer: Self-pay | Admitting: Family Medicine

## 2017-12-25 DIAGNOSIS — D571 Sickle-cell disease without crisis: Secondary | ICD-10-CM

## 2017-12-25 DIAGNOSIS — M62838 Other muscle spasm: Secondary | ICD-10-CM

## 2017-12-25 MED ORDER — OXYCODONE HCL 10 MG PO TABS: ORAL_TABLET | ORAL | 0 refills | Status: DC

## 2017-12-25 MED ORDER — CYCLOBENZAPRINE HCL 10 MG PO TABS: 10.0000 mg | ORAL_TABLET | Freq: Three times a day (TID) | ORAL | 2 refills | Status: DC | PRN

## 2017-12-25 NOTE — Progress Notes (Signed)
Rx for Oxy-IR sent to pharmacy today. Rx not to be refilled prior to 12/29/2017.

## 2017-12-25 NOTE — Telephone Encounter (Signed)
[facility] states that patient picked up Tizanidine on 12/15/2017 for a 15 day supply. Patient will not be able to pick up the new muscle relaxer until the 26 of month because that when she should be finish with the Tizanidine.

## 2017-12-25 NOTE — Progress Notes (Signed)
New Rx for Flexeril sent to pharmacy today, not to be refill prior to 12/29/2017. Tizandine Rx is cancelled today.

## 2017-12-28 NOTE — Telephone Encounter (Signed)
Patient notified

## 2017-12-28 NOTE — Telephone Encounter (Signed)
-----   Message from Kallie LocksNatalie M Stroud, FNP sent at 12/25/2017  2:35 PM EST ----- Regarding: "Refill" Rx for Oxy-IR sent to pharmacy today. Rx not to be refilled prior to 12/29/2017. Please inform patient. Thanks.

## 2018-01-06 MED ORDER — CYCLOBENZAPRINE HCL 10 MG PO TABS: 10.0000 mg | ORAL_TABLET | Freq: Three times a day (TID) | ORAL | 2 refills | Status: DC | PRN

## 2018-01-12 ENCOUNTER — Telehealth: Payer: Self-pay

## 2018-01-12 NOTE — Telephone Encounter (Signed)
Patient needs a refill on Oxycodone and Ibuprofen 

## 2018-01-13 ENCOUNTER — Other Ambulatory Visit: Payer: Self-pay | Admitting: Family Medicine

## 2018-01-13 DIAGNOSIS — M62838 Other muscle spasm: Secondary | ICD-10-CM

## 2018-01-13 DIAGNOSIS — D571 Sickle-cell disease without crisis: Secondary | ICD-10-CM

## 2018-01-13 MED ORDER — OXYCODONE HCL 10 MG PO TABS: ORAL_TABLET | ORAL | 0 refills | Status: DC

## 2018-01-13 MED ORDER — IBUPROFEN 800 MG PO TABS: 800.0000 mg | ORAL_TABLET | Freq: Three times a day (TID) | ORAL | 3 refills | Status: DC | PRN

## 2018-01-13 NOTE — Telephone Encounter (Signed)
Patient called again at [phone] when they have been sent

## 2018-01-13 NOTE — Progress Notes (Signed)
Rx for Oxy-IR and Motrin sent to pharmacy today.

## 2018-01-25 ENCOUNTER — Telehealth: Payer: Self-pay

## 2018-01-25 DIAGNOSIS — M62838 Other muscle spasm: Secondary | ICD-10-CM

## 2018-01-25 DIAGNOSIS — D571 Sickle-cell disease without crisis: Secondary | ICD-10-CM

## 2018-01-25 MED ORDER — OXYCODONE HCL 10 MG PO TABS: 10.0000 mg | ORAL_TABLET | ORAL | 0 refills | Status: AC | PRN

## 2018-01-25 NOTE — Telephone Encounter (Signed)
Patient needs a refill on Oxycodone.  

## 2018-01-25 NOTE — Telephone Encounter (Signed)
Reviewed [place] Substance Reporting system prior to prescribing opiate medications. No inconsistencies noted.  refilled 

## 2018-02-02 ENCOUNTER — Ambulatory Visit: Payer: Medicaid Other | Admitting: Family Medicine

## 2018-02-09 ENCOUNTER — Non-Acute Institutional Stay (HOSPITAL_COMMUNITY)
Admission: AD | Admit: 2018-02-09 | Discharge: 2018-02-09 | Disposition: A | Discharge status: Home / Self Care | Payer: Medicaid Other | Source: Ambulatory Visit | Attending: Internal Medicine | Admitting: Internal Medicine

## 2018-02-09 ENCOUNTER — Encounter: Payer: Self-pay | Admitting: Family Medicine

## 2018-02-09 ENCOUNTER — Ambulatory Visit (INDEPENDENT_AMBULATORY_CARE_PROVIDER_SITE_OTHER): Payer: Medicaid Other | Admitting: Family Medicine

## 2018-02-09 VITALS — BP 146/90 | HR 98 | Temp 98.0°F | Ht 65.0 in | Wt 276.0 lb

## 2018-02-09 DIAGNOSIS — M79604 Pain in right leg: Secondary | ICD-10-CM | POA: Diagnosis not present

## 2018-02-09 DIAGNOSIS — Z832 Family history of diseases of the blood and blood-forming organs and certain disorders involving the immune mechanism: Secondary | ICD-10-CM | POA: Diagnosis not present

## 2018-02-09 DIAGNOSIS — Z791 Long term (current) use of non-steroidal anti-inflammatories (NSAID): Secondary | ICD-10-CM | POA: Insufficient documentation

## 2018-02-09 DIAGNOSIS — F1190 Opioid use, unspecified, uncomplicated: Secondary | ICD-10-CM | POA: Diagnosis not present

## 2018-02-09 DIAGNOSIS — G894 Chronic pain syndrome: Secondary | ICD-10-CM | POA: Diagnosis not present

## 2018-02-09 DIAGNOSIS — D5700 Hb-SS disease with crisis, unspecified: Secondary | ICD-10-CM | POA: Diagnosis not present

## 2018-02-09 DIAGNOSIS — Z09 Encounter for follow-up examination after completed treatment for conditions other than malignant neoplasm: Secondary | ICD-10-CM | POA: Diagnosis not present

## 2018-02-09 DIAGNOSIS — Z79899 Other long term (current) drug therapy: Secondary | ICD-10-CM | POA: Diagnosis not present

## 2018-02-09 DIAGNOSIS — M79605 Pain in left leg: Secondary | ICD-10-CM | POA: Insufficient documentation

## 2018-02-09 DIAGNOSIS — Z79891 Long term (current) use of opiate analgesic: Secondary | ICD-10-CM | POA: Insufficient documentation

## 2018-02-09 DIAGNOSIS — E66813 Obesity, class 3: Secondary | ICD-10-CM

## 2018-02-09 DIAGNOSIS — Z6841 Body Mass Index (BMI) 40.0 and over, adult: Secondary | ICD-10-CM

## 2018-02-09 LAB — POCT URINALYSIS DIP (MANUAL ENTRY)
Bilirubin, UA: NEGATIVE
Blood, UA: NEGATIVE
Glucose, UA: NEGATIVE mg/dL
Ketones, POC UA: NEGATIVE mg/dL
Leukocytes, UA: NEGATIVE
Nitrite, UA: NEGATIVE
Protein Ur, POC: NEGATIVE mg/dL
Spec Grav, UA: 1.02 (ref 1.010–1.025)
Urobilinogen, UA: 0.2 E.U./dL
pH, UA: 6 (ref 5.0–8.0)

## 2018-02-09 LAB — CBC WITH DIFFERENTIAL/PLATELET
Abs Immature Granulocytes: 0.03 10*3/uL (ref 0.00–0.07)
Basophils Absolute: 0 10*3/uL (ref 0.0–0.1)
Basophils Relative: 0 %
Eosinophils Absolute: 0.1 10*3/uL (ref 0.0–0.5)
Eosinophils Relative: 2 %
HEMATOCRIT: 32.4 % — AB (ref 36.0–46.0)
Hemoglobin: 10.9 g/dL — ABNORMAL LOW (ref 12.0–15.0)
Immature Granulocytes: 1 %
LYMPHS PCT: 25 %
Lymphs Abs: 1.6 10*3/uL (ref 0.7–4.0)
MCH: 23.4 pg — ABNORMAL LOW (ref 26.0–34.0)
MCHC: 33.6 g/dL (ref 30.0–36.0)
MCV: 69.7 fL — ABNORMAL LOW (ref 80.0–100.0)
Monocytes Absolute: 0.4 10*3/uL (ref 0.1–1.0)
Monocytes Relative: 6 %
Neutro Abs: 4.3 10*3/uL (ref 1.7–7.7)
Neutrophils Relative %: 66 %
Platelets: 191 10*3/uL (ref 150–400)
RBC: 4.65 MIL/uL (ref 3.87–5.11)
RDW: 14.6 % (ref 11.5–15.5)
WBC: 6.5 10*3/uL (ref 4.0–10.5)
nRBC: 0 % (ref 0.0–0.2)

## 2018-02-09 LAB — COMPREHENSIVE METABOLIC PANEL
ALT: 12 U/L (ref 0–44)
AST: 13 U/L — ABNORMAL LOW (ref 15–41)
Albumin: 3.9 g/dL (ref 3.5–5.0)
Alkaline Phosphatase: 64 U/L (ref 38–126)
Anion gap: 8 (ref 5–15)
BUN: 15 mg/dL (ref 6–20)
CO2: 24 mmol/L (ref 22–32)
CREATININE: 0.71 mg/dL (ref 0.44–1.00)
Calcium: 9.2 mg/dL (ref 8.9–10.3)
Chloride: 109 mmol/L (ref 98–111)
GFR calc Af Amer: >60 mL/min (ref >60)
GFR calc non Af Amer: >60 mL/min (ref >60)
Glucose, Bld: 98 mg/dL (ref 70–99)
POTASSIUM: 4.3 mmol/L (ref 3.5–5.1)
Sodium: 141 mmol/L (ref 135–145)
Total Bilirubin: 0.7 mg/dL (ref 0.3–1.2)
Total Protein: 7 g/dL (ref 6.5–8.1)

## 2018-02-09 LAB — URINALYSIS, ROUTINE W REFLEX MICROSCOPIC
BILIRUBIN URINE: NEGATIVE
Glucose, UA: NEGATIVE mg/dL
HGB URINE DIPSTICK: NEGATIVE
Ketones, ur: NEGATIVE mg/dL
Leukocytes, UA: NEGATIVE
Nitrite: NEGATIVE
Protein, ur: NEGATIVE mg/dL
SPECIFIC GRAVITY, URINE: 1.011 (ref 1.005–1.030)
pH: 5 (ref 5.0–8.0)

## 2018-02-09 LAB — RETICULOCYTES
Immature Retic Fract: 17.2 % — ABNORMAL HIGH (ref 2.3–15.9)
RBC.: 4.65 MIL/uL (ref 3.87–5.11)
Retic Count, Absolute: 121.8 10*3/uL (ref 19.0–186.0)
Retic Ct Pct: 2.6 % (ref 0.4–3.1)

## 2018-02-09 LAB — HCG, SERUM, QUALITATIVE: Preg, Serum: NEGATIVE

## 2018-02-09 MED ORDER — DIPHENHYDRAMINE HCL 12.5 MG/5ML PO ELIX: 12.5000 mg | ORAL_SOLUTION | Freq: Four times a day (QID) | ORAL | Status: DC | PRN

## 2018-02-09 MED ORDER — KETOROLAC TROMETHAMINE 15 MG/ML IJ SOLN
15.0000 mg | Freq: Once | INTRAMUSCULAR | Status: AC
  Administered 2018-02-09: 15 mg via INTRAVENOUS
  Filled 2018-02-09: qty 1

## 2018-02-09 MED ORDER — ACETAMINOPHEN 325 MG PO TABS
650.0000 mg | ORAL_TABLET | Freq: Once | ORAL | Status: AC
  Administered 2018-02-09: 650 mg via ORAL
  Filled 2018-02-09: qty 2

## 2018-02-09 MED ORDER — OXYCODONE HCL 20 MG PO TABS: 20.0000 mg | ORAL_TABLET | ORAL | 0 refills | Status: DC | PRN

## 2018-02-09 MED ORDER — OXYCODONE HCL 5 MG PO TABS
10.0000 mg | ORAL_TABLET | Freq: Once | ORAL | Status: AC
  Administered 2018-02-09: 10 mg via ORAL
  Filled 2018-02-09: qty 2

## 2018-02-09 MED ORDER — DEXTROSE-NACL 5-0.45 % IV SOLN
INTRAVENOUS | Status: DC
  Administered 2018-02-09: 11:00:00 via INTRAVENOUS

## 2018-02-09 MED ORDER — HYDROMORPHONE 1 MG/ML IV SOLN
INTRAVENOUS | Status: DC
  Administered 2018-02-09: 30 mg via INTRAVENOUS
  Administered 2018-02-09: 5.3 mg via INTRAVENOUS
  Filled 2018-02-09: qty 30

## 2018-02-09 MED ORDER — ONDANSETRON HCL 4 MG/2ML IJ SOLN: 4.0000 mg | Freq: Four times a day (QID) | INTRAMUSCULAR | Status: DC | PRN

## 2018-02-09 MED ORDER — SODIUM CHLORIDE 0.9% FLUSH: 9.0000 mL | INTRAVENOUS | Status: DC | PRN

## 2018-02-09 MED ORDER — NALOXONE HCL 0.4 MG/ML IJ SOLN: 0.4000 mg | INTRAMUSCULAR | Status: DC | PRN

## 2018-02-09 NOTE — Progress Notes (Signed)
Patient admitted to the day infusion hospital. Patient's primary provider recommended patient to infusion hospital for sickle cell pain crisis. Patient initially reported pain in bilateral legs rated 7/10. For pain management, patient placed on Full-Dose Dilaudid PCA, given 15 mg Toradol, 650 mg Tylenol, 10 mg Oxycodone and hydrated with IV fluids. At discharge, patient reported pain level at 5/10. Discharge instructions given. Patient to follow-up with primary provider next week.  Vital signs stable. Alert, oriented and ambulatory at discharge.

## 2018-02-09 NOTE — Discharge Instructions (Signed)
Follow-up in primary care with Stephanie Burch as previously scheduled. Also, continue oxycodone 20 mg as discussed with primary provider.  Continue to hydrate with 64 ounces of water per day  Strict return precautions: If you experience worsening pain or additional symptoms please return to clinic in a.m. or report to the closest emergency department. Sickle Cell Anemia, Adult Sickle cell anemia is a condition where your red blood cells are shaped like sickles. Red blood cells carry oxygen through the body. Sickle-shaped cells do not live as long as normal red blood cells. They also clump together and block blood from flowing through the blood vessels. This prevents the body from getting enough oxygen. Sickle cell anemia causes organ damage and pain. It also increases the risk of infection. Follow these instructions at home: Medicines  Take over-the-counter and prescription medicines only as told by your doctor.  If you were prescribed an antibiotic medicine, take it as told by your doctor. Do not stop taking the antibiotic even if you start to feel better.  If you develop a fever, do not take medicines to lower the fever right away. Tell your doctor about the fever. Managing pain, stiffness, and swelling  Try these methods to help with pain: ? Use a heating pad. ? Take a warm bath. ? Distract yourself, such as by watching TV. Eating and drinking  Drink enough fluid to keep your pee (urine) clear or pale yellow. Drink more in hot weather and during exercise.  Limit or avoid alcohol.  Eat a healthy diet. Eat plenty of fruits, vegetables, whole grains, and lean protein.  Take vitamins and supplements as told by your doctor. Traveling  When traveling, keep these with you: ? Your medical information. ? The names of your doctors. ? Your medicines.  If you need to take an airplane, talk to your doctor first. Activity  Rest often.  Avoid exercises that make your heart beat much  faster, such as jogging. General instructions  Do not use products that have nicotine or tobacco, such as cigarettes and e-cigarettes. If you need help quitting, ask your doctor.  Consider wearing a medical alert bracelet.  Avoid being in high places (high altitudes), such as mountains.  Avoid very hot or cold temperatures.  Avoid places where the temperature changes a lot.  Keep all follow-up visits as told by your doctor. This is important. Contact a doctor if:  A joint hurts.  Your feet or hands hurt or swell.  You feel tired (fatigued). Get help right away if:  You have symptoms of infection. These include: ? Fever. ? Chills. ? Being very tired. ? Irritability. ? Poor eating. ? Throwing up (vomiting).  You feel dizzy or faint.  You have new stomach pain, especially on the left side.  You have a an erection (priapism) that lasts more than 4 hours.  You have numbness in your arms or legs.  You have a hard time moving your arms or legs.  You have trouble talking.  You have pain that does not go away when you take medicine.  You are short of breath.  You are breathing fast.  You have a long-term cough.  You have pain in your chest.  You have a bad headache.  You have a stiff neck.  Your stomach looks bloated even though you did not eat much.  Your skin is pale.  You suddenly cannot see well. Summary  Sickle cell anemia is a condition where your red blood cells are  shaped like sickles.  Follow your doctor's advice on ways to manage pain, food to eat, activities to do, and steps to take for safe travel.  Get medical help right away if you have any signs of infection, such as a fever. This information is not intended to replace advice given to you by your health care provider. Make sure you discuss any questions you have with your health care provider. Document Released: 11/10/2012 Document Revised: 02/26/2016 Document Reviewed: 02/26/2016 Elsevier  Interactive Patient Education  2019 [employer]

## 2018-02-09 NOTE — Discharge Summary (Signed)
[facility] Discharge Summary   Patient ID: Stephanie Burch MRN: [id number] DOB/AGE: Feb 20, 1980 38 y.o.  Admit date: 02/09/2018 Discharge date: 02/09/2018  Primary Care Physician:  Kallie Locks, FNP  Admission Diagnoses:  Active Problems:   Sickle cell pain crisis [facility])   Discharge Medications:  Allergies as of 02/09/2018   No Known Allergies     Medication List    TAKE these medications   cyclobenzaprine 10 MG tablet Commonly known as:  FLEXERIL Take 1 tablet (10 mg total) by mouth 3 (three) times daily as needed for muscle spasms.   folic acid 1 MG tablet Commonly known as:  FOLVITE Take 1 tablet (1 mg total) by mouth daily.   ibuprofen 800 MG tablet Commonly known as:  ADVIL,MOTRIN Take 1 tablet (800 mg total) by mouth every 8 (eight) hours as needed for headache, mild pain, moderate pain or cramping.   ondansetron 4 MG/2ML Soln injection Commonly known as:  ZOFRAN Inject 2 mLs (4 mg total) into the vein every 6 (six) hours as needed for nausea or vomiting.   Oxycodone HCl 10 MG Tabs Take 1 tablet (10 mg total) by mouth every 4 (four) hours as needed for up to 15 days. 01/28/2018 fill date Take 1 tablet every 4 hours for moderate to severe pain. What changed:  Another medication with the same name was added. Make sure you understand how and when to take each.   Oxycodone HCl 20 MG Tabs Take 1 tablet (20 mg total) by mouth every 4 (four) hours as needed for up to 7 days. What changed:  You were already taking a medication with the same name, and this prescription was added. Make sure you understand how and when to take each.   Vitamin D (Ergocalciferol) 1.25 MG (50000 UT) Caps capsule Commonly known as:  DRISDOL Take 1 capsule (50,000 Units total) by mouth every 7 (seven) days.        Consults:  None  Significant Diagnostic Studies:  No results found.   [facility] Course: Stephanie Burch, a 38 year old female with a medical  history significant for sickle cell anemia, chronic pain syndrome, and morbid obesity was admitted to day infusion center for pain management and extended observation. Review laboratory results: Hemoglobin 10.9, which is consistent with baseline.  All other laboratory values unremarkable.  D5 0.45% saline at 125 mL/h for cellular rehydration Toradol 15 mg IV x1 Custom dose PCA Dilaudid for pain control.  Patient used a total of 5.3 mg with 19 demands and 16 delivered. Oxycodone 10 mg by mouth x1 Pain intensity decreased to 5/10. Patient alert, oriented, and ambulating without assistance. Patient will discharge home and a hemodynamically stable condition.  Discharge instructions: Discussed medication regimen with primary provider, oxycodone increased to 20 mg by mouth every 4 hours for moderate to severe pain.  Continue to hydrate consistently with 64 ounces of fluid Strict return precautions Return to clinic or nearest emergency department if symptoms worsen. Avoid all stressors that precipitate further sickle cell crisis    Physical Exam at Discharge:  BP 121/75 (BP Location: Right Arm)   Pulse (!) 103   Temp 98.3 F (36.8 C) (Oral)   Resp 15   LMP 02/03/2018   SpO2 100%  Physical Exam Constitutional:      Appearance: She is obese.  Cardiovascular:     Rate and Rhythm: Normal rate and regular rhythm.     Pulses: Normal pulses.     Heart  sounds: Normal heart sounds.  Pulmonary:     Effort: Pulmonary effort is normal.  Abdominal:     General: Bowel sounds are normal.  Skin:    General: Skin is warm and dry.  Neurological:     General: No focal deficit present.     Mental Status: She is alert. Mental status is at baseline.  Psychiatric:        Mood and Affect: Mood normal.        Behavior: Behavior normal.        Thought Content: Thought content normal.        Judgment: Judgment normal.      Disposition at Discharge: Discharge disposition: 01-Home or Self  Care       Discharge Orders: Discharge Instructions    Discharge patient   Complete by:  As directed    Discharge disposition:  01-Home or Self Care   Discharge patient date:  02/09/2018      Condition at Discharge:   Stable  Time spent on Discharge: 20 minutes.  Signed:  Nolon NationsLachina Moore Sabastien Tyler  APRN, MSN, FNP-C [facility] Care [facility] [address]  [place], [place] [id number] [phone]  02/09/2018, 4:08 PM

## 2018-02-09 NOTE — H&P (Signed)
[facility] History and Physical   Date: 02/09/2018  Patient name: Stephanie Burch Medical record number: [id number] Date of birth: 29-Dec-1980 Age: 38 y.o. Gender: female PCP: Kallie LocksStroud, [name], FNP  Attending physician: Quentin AngstJegede, Olugbemiga E, MD  Chief Complaint:   History of Present Illness: Stephanie StarchKimaada Duey, a 38 year old female with a medical history significant for sickle cell anemia, hemoglobin [place], chronic pain syndrome, and morbid obesity presents complaining of bilateral lower extremity pain for greater than 1 week.  Patient states that lower extremity pain is consistent with previous sickle cell crisis.  Patient attributes current pain crisis to recent travel.  Patient states that she had a 2-hour car ride and was in pain crisis on arrival to destination.  She has been taking oxycodone 10 mg consistently without sustained relief.  Current pain intensity is 8-9/10 characterized as constant and aching.  Patient last had oxycodone and ibuprofen this a.m.  She says pain is worsened by sitting, standing, and ambulating. She denies headache, chest pain, shortness of breath, nausea, vomiting, or diarrhea.  Meds: Medications Prior to Admission  Medication Sig Dispense Refill Last Dose  . cyclobenzaprine (FLEXERIL) 10 MG tablet Take 1 tablet (10 mg total) by mouth 3 (three) times daily as needed for muscle spasms. 30 tablet 2 Taking  . folic acid (FOLVITE) 1 MG tablet Take 1 tablet (1 mg total) by mouth daily. 30 tablet 11 Taking  . ibuprofen (ADVIL,MOTRIN) 800 MG tablet Take 1 tablet (800 mg total) by mouth every 8 (eight) hours as needed for headache, mild pain, moderate pain or cramping. 30 tablet 3 Taking  . ondansetron (ZOFRAN) 4 MG/2ML SOLN injection Inject 2 mLs (4 mg total) into the vein every 6 (six) hours as needed for nausea or vomiting. (Patient not taking: Reported on 02/09/2018) 20 mL 0 Not Taking  . Oxycodone HCl 10 MG TABS Take 1 tablet (10 mg total) by mouth every 4  (four) hours as needed for up to 15 days. 01/28/2018 fill date Take 1 tablet every 4 hours for moderate to severe pain. 90 tablet 0 Taking  . Vitamin D, Ergocalciferol, (DRISDOL) 50000 units CAPS capsule Take 1 capsule (50,000 Units total) by mouth every 7 (seven) days. 5 capsule 2 Taking    Allergies: Patient has no known allergies. Past Medical History:  Diagnosis Date  . Sickle cell anemia (HCC)    No past surgical history on file. Family History  Problem Relation Age of Onset  . Hypertension Mother   . Sickle cell trait Mother   . Diabetes Father   . Sickle cell trait Father    Social History   Socioeconomic History  . Marital status: Single    Spouse name: Not on file  . Number of children: Not on file  . Years of education: Not on file  . Highest education level: Not on file  Occupational History  . Not on file  Social Needs  . Financial resource strain: Not on file  . Food insecurity:    Worry: Not on file    Inability: Not on file  . Transportation needs:    Medical: Not on file    Non-medical: Not on file  Tobacco Use  . Smoking status: Never Smoker  . Smokeless tobacco: Never Used  Substance and Sexual Activity  . Alcohol use: Never    Frequency: Never  . Drug use: Never  . Sexual activity: Yes    Birth control/protection: None  Lifestyle  .  Physical activity:    Days per week: Not on file    Minutes per session: Not on file  . Stress: Not on file  Relationships  . Social connections:    Talks on phone: Not on file    Gets together: Not on file    Attends religious service: Not on file    Active member of club or organization: Not on file    Attends meetings of clubs or organizations: Not on file    Relationship status: Not on file  . Intimate partner violence:    Fear of current or ex partner: Not on file    Emotionally abused: Not on file    Physically abused: Not on file    Forced sexual activity: Not on file  Other Topics Concern  . Not on  file  Social History Narrative  . Not on file  Review of Systems  Constitutional: Negative for chills and fever.  HENT: Negative.   Respiratory: Negative for cough and shortness of breath.   Cardiovascular: Negative for chest pain.  Gastrointestinal: Negative.   Genitourinary: Negative.   Musculoskeletal: Positive for joint pain (Bilateral lower extremity pain).  Skin: Negative.   Neurological: Negative for dizziness and headaches.  Endo/Heme/Allergies: Negative.   Psychiatric/Behavioral: Negative for depression and suicidal ideas.    Physical Exam: Last menstrual period 02/03/2018. Physical Exam Constitutional:      General: She is in acute distress.     Appearance: She is obese.  HENT:     Head: Normocephalic.     Mouth/Throat:     Mouth: Mucous membranes are moist.     Pharynx: Oropharynx is clear.  Eyes:     General: No scleral icterus.    Pupils: Pupils are equal, round, and reactive to light.  Neck:     Musculoskeletal: Normal range of motion.  Cardiovascular:     Rate and Rhythm: Normal rate and regular rhythm.  Pulmonary:     Effort: Pulmonary effort is normal.     Breath sounds: Normal breath sounds.  Skin:    General: Skin is warm and dry.  Neurological:     General: No focal deficit present.     Mental Status: She is alert and oriented to person, place, and time. Mental status is at baseline.  Psychiatric:        Mood and Affect: Mood normal.        Behavior: Behavior normal.        Thought Content: Thought content normal.        Judgment: Judgment normal.      Lab results: Results for orders placed or performed in visit on 02/09/18 (from the past 24 hour(s))  POCT urinalysis dipstick     Status: Normal   Collection Time: 02/09/18  9:29 AM  Result Value Ref Range   Color, UA yellow yellow   Clarity, UA clear clear   Glucose, UA negative negative mg/dL   Bilirubin, UA negative negative   Ketones, POC UA negative negative mg/dL   Spec Grav, UA  1.6101.020 1.010 - 1.025   Blood, UA negative negative   pH, UA 6.0 5.0 - 8.0   Protein Ur, POC negative negative mg/dL   Urobilinogen, UA 0.2 0.2 or 1.0 E.U./dL   Nitrite, UA Negative Negative   Leukocytes, UA Negative Negative    Imaging results:  No results found.   Assessment & Plan:  Patient will be admitted to the day infusion center for extended observation  Start  IV D5.45 for cellular rehydration at 125/hr  Start Toradol 15 mg IV times one  Start Dilaudid PCA, full dose  Patient will be re-evaluated for pain intensity in the context of function and relationship to baseline as care progresses.  If no significant pain relief, will transfer patient to inpatient services for a higher level of care.   Review CMP,  Reticulocytes, and CBC w/differential    Nolon Nations  APRN, MSN, FNP-C Patient Care [facility] [address] [place], [place] [address] [phone]  02/09/2018, 9:36 AM

## 2018-02-09 NOTE — Progress Notes (Signed)
Sickle Cell [facility] Follow Up  Subjective:    Patient ID: Stephanie Burch, female    DOB: 1981/01/24, 38 y.o.   MRN: [id number]  Chief Complaint  Patient presents with  . Follow-up    Sickle cell   HPI Stephanie Burch is a 38 year old female with a past medical history of Sickle Cell Anemia. She is here today for follow up.   Current Status: Since her last office visit, she is doing well with no complaints. She states that she has pain in her legs for the past 3 days. She rates her pain today at 6/10 and worsening. She states that she has been trying to use other alternatives, and will begin using CBD oil and other remedies so that she can control her pain at home and not use more of her prescribed short-acting (Oxy-IR) pain medication. She has not has a hospital visit for Sickle Cell Crisis since 10/18/2017 where she was treated and discharged the same day. She is currently taking all medications as prescribed and staying well hydrated. She reports occasional dizziness and headaches. She has increased anxiety today concerning her pain management.   She denies fevers, chills, fatigue, recent infections, weight loss, and night sweats. She has not had any visual changes, and falls. No chest pain, heart palpitations, cough and shortness of breath reported. No reports of GI problems such as nausea, vomiting, diarrhea, and constipation. She has no reports of blood in stools, dysuria and hematuria.   Review of Systems  Constitutional: Negative.   HENT: Negative.   Eyes: Negative.   Respiratory: Negative.   Cardiovascular: Negative.   Gastrointestinal: Positive for abdominal distention (Obese).  Endocrine: Negative.   Genitourinary: Negative.   Musculoskeletal: Positive for arthralgias (lower extremities).  Skin: Negative.   Allergic/Immunologic: Negative.   Neurological: Positive for dizziness and headaches.  Hematological: Negative.   Psychiatric/Behavioral: Negative.      Objective:   Physical  Exam Vitals signs and nursing note reviewed.  Constitutional:      General: She is in acute distress.     Appearance: Normal appearance. She is obese. She is ill-appearing.  HENT:     Head: Normocephalic and atraumatic.     Right Ear: External ear normal.     Left Ear: External ear normal.     Nose: Nose normal.     Mouth/Throat:     Mouth: Mucous membranes are moist.     Pharynx: Oropharynx is clear.  Eyes:     Conjunctiva/sclera: Conjunctivae normal.  Neck:     Musculoskeletal: Normal range of motion and neck supple.  Cardiovascular:     Rate and Rhythm: Normal rate and regular rhythm.     Pulses: Normal pulses.     Heart sounds: Normal heart sounds.  Pulmonary:     Effort: Pulmonary effort is normal.     Breath sounds: Normal breath sounds.  Abdominal:     General: Bowel sounds are normal.     Palpations: Abdomen is soft.  Musculoskeletal: Normal range of motion.  Skin:    General: Skin is warm and dry.     Capillary Refill: Capillary refill takes less than 2 seconds.  Neurological:     General: No focal deficit present.     Mental Status: She is alert and oriented to person, place, and time.  Psychiatric:        Mood and Affect: Mood normal.        Thought Content: Thought content normal.  Judgment: Judgment normal.     Comments: Tearful today r/t increased pain    Assessment & Plan:   1. Hb-SS disease with crisis [facility]) Patient having increased lower extremity pain for the past few days. [facility] for further assessment, IVF, and possible IV pain medications. We will evaluate possible increasing dose of Oxy-IR today. She will continue to take pain medications as prescribed; will continue to avoid extreme heat and cold; will continue to eat a healthy diet and drink at least 64 ounces of water daily; continue stool softener as needed; will avoid colds and flu; will continue to get plenty of sleep and rest; will continue to  avoid high stressful situations and remain infection free; will continue Folic Acid 1 mg daily to avoid sickle cell crisis.   2. Chronic pain syndrome  3. Chronic, continuous use of opioids  4. Class 3 severe obesity due to excess calories with serious comorbidity and body mass index (BMI) of 45.0 to 49.9 in adult [facility]) Body mass index is 45.93 kg/m. Goal BMI  is <30. Encouraged efforts to reduce weight include engaging in physical activity as tolerated with goal of 150 minutes per week. Improve dietary choices and eat a meal regimen consistent with a Mediterranean or DASH diet. Reduce simple carbohydrates. Do not skip meals and eat healthy snacks throughout the day to avoid over-eating at dinner. Set a goal weight loss that is achievable for you.  5. Follow up She will follow up in 1 week. - POCT urinalysis dipstick  Meds ordered this encounter  Medications  . Oxycodone HCl 20 MG TABS    Sig: Take 1 tablet (20 mg total) by mouth every 4 (four) hours as needed for up to 7 days.    Dispense:  42 tablet    Refill:  0    Order Specific Question:   Supervising Provider    Answer:   Quentin Angst [[id number]]    [name],  MSN, FNP-C Patient Care [facility] [address] [place], [place] [address] [phone]

## 2018-02-10 ENCOUNTER — Ambulatory Visit: Payer: Medicaid Other | Admitting: Family Medicine

## 2018-02-16 ENCOUNTER — Other Ambulatory Visit: Payer: Self-pay | Admitting: Family Medicine

## 2018-02-16 ENCOUNTER — Encounter: Payer: Self-pay | Admitting: Family Medicine

## 2018-02-16 ENCOUNTER — Encounter (HOSPITAL_COMMUNITY): Payer: Self-pay | Admitting: *Deleted

## 2018-02-16 ENCOUNTER — Ambulatory Visit (INDEPENDENT_AMBULATORY_CARE_PROVIDER_SITE_OTHER): Payer: Medicaid Other | Admitting: Family Medicine

## 2018-02-16 ENCOUNTER — Telehealth: Payer: Self-pay

## 2018-02-16 ENCOUNTER — Non-Acute Institutional Stay (HOSPITAL_COMMUNITY)
Admission: AD | Admit: 2018-02-16 | Discharge: 2018-02-16 | Disposition: A | Discharge status: Home / Self Care | Payer: Medicaid Other | Source: Ambulatory Visit | Attending: Internal Medicine | Admitting: Internal Medicine

## 2018-02-16 VITALS — BP 126/90 | HR 96 | Temp 98.1°F | Ht 65.0 in | Wt 276.0 lb

## 2018-02-16 DIAGNOSIS — F1190 Opioid use, unspecified, uncomplicated: Secondary | ICD-10-CM | POA: Diagnosis not present

## 2018-02-16 DIAGNOSIS — R110 Nausea: Secondary | ICD-10-CM

## 2018-02-16 DIAGNOSIS — Z79899 Other long term (current) drug therapy: Secondary | ICD-10-CM | POA: Insufficient documentation

## 2018-02-16 DIAGNOSIS — D5700 Hb-SS disease with crisis, unspecified: Secondary | ICD-10-CM | POA: Diagnosis not present

## 2018-02-16 DIAGNOSIS — Z791 Long term (current) use of non-steroidal anti-inflammatories (NSAID): Secondary | ICD-10-CM | POA: Diagnosis not present

## 2018-02-16 DIAGNOSIS — G894 Chronic pain syndrome: Secondary | ICD-10-CM | POA: Diagnosis not present

## 2018-02-16 DIAGNOSIS — M79605 Pain in left leg: Secondary | ICD-10-CM | POA: Insufficient documentation

## 2018-02-16 DIAGNOSIS — Z09 Encounter for follow-up examination after completed treatment for conditions other than malignant neoplasm: Secondary | ICD-10-CM

## 2018-02-16 DIAGNOSIS — E66813 Obesity, class 3: Secondary | ICD-10-CM

## 2018-02-16 DIAGNOSIS — M79604 Pain in right leg: Secondary | ICD-10-CM | POA: Insufficient documentation

## 2018-02-16 DIAGNOSIS — Z6841 Body Mass Index (BMI) 40.0 and over, adult: Secondary | ICD-10-CM

## 2018-02-16 LAB — COMPREHENSIVE METABOLIC PANEL
ALT: 13 U/L (ref 0–44)
AST: 12 U/L — ABNORMAL LOW (ref 15–41)
Albumin: 4 g/dL (ref 3.5–5.0)
Alkaline Phosphatase: 62 U/L (ref 38–126)
Anion gap: 6 (ref 5–15)
BUN: 9 mg/dL (ref 6–20)
CHLORIDE: 108 mmol/L (ref 98–111)
CO2: 27 mmol/L (ref 22–32)
Calcium: 9.4 mg/dL (ref 8.9–10.3)
Creatinine, Ser: 0.66 mg/dL (ref 0.44–1.00)
GFR calc Af Amer: >60 mL/min (ref >60)
GFR calc non Af Amer: >60 mL/min (ref >60)
Glucose, Bld: 98 mg/dL (ref 70–99)
Potassium: 4.4 mmol/L (ref 3.5–5.1)
Sodium: 141 mmol/L (ref 135–145)
Total Bilirubin: 1 mg/dL (ref 0.3–1.2)
Total Protein: 7 g/dL (ref 6.5–8.1)

## 2018-02-16 LAB — CBC WITH DIFFERENTIAL/PLATELET
Abs Immature Granulocytes: 0.04 10*3/uL (ref 0.00–0.07)
Basophils Absolute: 0 10*3/uL (ref 0.0–0.1)
Basophils Relative: 1 %
Eosinophils Absolute: 0.1 10*3/uL (ref 0.0–0.5)
Eosinophils Relative: 2 %
HCT: 32.5 % — ABNORMAL LOW (ref 36.0–46.0)
HEMOGLOBIN: 10.7 g/dL — AB (ref 12.0–15.0)
Immature Granulocytes: 1 %
LYMPHS PCT: 24 %
Lymphs Abs: 1.6 10*3/uL (ref 0.7–4.0)
MCH: 23.1 pg — ABNORMAL LOW (ref 26.0–34.0)
MCHC: 32.9 g/dL (ref 30.0–36.0)
MCV: 70 fL — ABNORMAL LOW (ref 80.0–100.0)
Monocytes Absolute: 0.4 10*3/uL (ref 0.1–1.0)
Monocytes Relative: 7 %
Neutro Abs: 4.2 10*3/uL (ref 1.7–7.7)
Neutrophils Relative %: 65 %
Platelets: 187 10*3/uL (ref 150–400)
RBC: 4.64 MIL/uL (ref 3.87–5.11)
RDW: 14.7 % (ref 11.5–15.5)
WBC: 6.4 10*3/uL (ref 4.0–10.5)
nRBC: 0 % (ref 0.0–0.2)

## 2018-02-16 LAB — POCT URINALYSIS DIP (MANUAL ENTRY)
Bilirubin, UA: NEGATIVE
Blood, UA: NEGATIVE
Glucose, UA: NEGATIVE mg/dL
Ketones, POC UA: NEGATIVE mg/dL
Leukocytes, UA: NEGATIVE
Nitrite, UA: NEGATIVE
Protein Ur, POC: NEGATIVE mg/dL
Spec Grav, UA: 1.015 (ref 1.010–1.025)
Urobilinogen, UA: 1 E.U./dL
pH, UA: 6.5 (ref 5.0–8.0)

## 2018-02-16 LAB — RETICULOCYTES
Immature Retic Fract: 28.9 % — ABNORMAL HIGH (ref 2.3–15.9)
RBC.: 4.64 MIL/uL (ref 3.87–5.11)
RETIC COUNT ABSOLUTE: 177.2 10*3/uL (ref 19.0–186.0)
Retic Ct Pct: 3.8 % — ABNORMAL HIGH (ref 0.4–3.1)

## 2018-02-16 MED ORDER — ACETAMINOPHEN 500 MG PO TABS
1000.0000 mg | ORAL_TABLET | Freq: Once | ORAL | Status: AC
  Administered 2018-02-16: 1000 mg via ORAL
  Filled 2018-02-16: qty 2

## 2018-02-16 MED ORDER — ONDANSETRON HCL 4 MG/2ML IJ SOLN: 4.0000 mg | Freq: Four times a day (QID) | INTRAMUSCULAR | 2 refills | Status: DC | PRN

## 2018-02-16 MED ORDER — ONDANSETRON HCL 4 MG PO TABS: 4.0000 mg | ORAL_TABLET | Freq: Three times a day (TID) | ORAL | 0 refills | Status: DC | PRN

## 2018-02-16 MED ORDER — DIPHENHYDRAMINE HCL 25 MG PO CAPS: 25.0000 mg | ORAL_CAPSULE | ORAL | Status: DC | PRN

## 2018-02-16 MED ORDER — OXYCODONE HCL 20 MG PO TABS: 20.0000 mg | ORAL_TABLET | ORAL | 0 refills | Status: DC | PRN

## 2018-02-16 MED ORDER — HYDROXYUREA 500 MG PO CAPS: 2000.0000 mg | ORAL_CAPSULE | Freq: Every day | ORAL | 1 refills | Status: DC

## 2018-02-16 MED ORDER — CYCLOBENZAPRINE HCL 10 MG PO TABS: 10.0000 mg | ORAL_TABLET | Freq: Three times a day (TID) | ORAL | 2 refills | Status: DC | PRN

## 2018-02-16 MED ORDER — ONDANSETRON HCL 4 MG/2ML IJ SOLN
4.0000 mg | Freq: Four times a day (QID) | INTRAMUSCULAR | Status: DC | PRN
  Administered 2018-02-16: 4 mg via INTRAVENOUS
  Filled 2018-02-16: qty 2

## 2018-02-16 MED ORDER — KETOROLAC TROMETHAMINE 30 MG/ML IJ SOLN
30.0000 mg | Freq: Once | INTRAMUSCULAR | Status: AC
  Administered 2018-02-16: 30 mg via INTRAVENOUS
  Filled 2018-02-16: qty 1

## 2018-02-16 MED ORDER — SODIUM CHLORIDE 0.9% FLUSH: 9.0000 mL | INTRAVENOUS | Status: DC | PRN

## 2018-02-16 MED ORDER — NALOXONE HCL 0.4 MG/ML IJ SOLN: 0.4000 mg | INTRAMUSCULAR | Status: DC | PRN

## 2018-02-16 MED ORDER — SODIUM CHLORIDE 0.45 % IV SOLN
INTRAVENOUS | Status: DC
  Administered 2018-02-16: 11:00:00 via INTRAVENOUS

## 2018-02-16 MED ORDER — HYDROMORPHONE 1 MG/ML IV SOLN
INTRAVENOUS | Status: DC
  Administered 2018-02-16: 11.8 mg via INTRAVENOUS
  Administered 2018-02-16: 30 mg via INTRAVENOUS
  Filled 2018-02-16: qty 30

## 2018-02-16 NOTE — Telephone Encounter (Signed)
PA has been submitted for Oxycodone 20 mg. I left a vm for sickle cell medicaid to send approval through.

## 2018-02-16 NOTE — Discharge Summary (Signed)
[facility] Discharge Summary   Patient ID: Stephanie Burch MRN: [id number] DOB/AGE: [birth year] 38 y.o.  Admit date: 02/16/2018 Discharge date: 02/16/2018  Primary Care Physician:  Kallie Locks, FNP  Admission Diagnoses:  Active Problems:   Sickle cell pain crisis [facility])   Discharge Medications:  Allergies as of 02/16/2018   No Known Allergies     Medication List    TAKE these medications   cyclobenzaprine 10 MG tablet Commonly known as:  FLEXERIL Take 1 tablet (10 mg total) by mouth 3 (three) times daily as needed for muscle spasms.   folic acid 1 MG tablet Commonly known as:  FOLVITE Take 1 tablet (1 mg total) by mouth daily.   hydroxyurea 500 MG capsule Commonly known as:  HYDREA Take 4 capsules (2,000 mg total) by mouth daily. May take with food to minimize GI side effects.   ibuprofen 800 MG tablet Commonly known as:  ADVIL,MOTRIN Take 1 tablet (800 mg total) by mouth every 8 (eight) hours as needed for headache, mild pain, moderate pain or cramping.   ondansetron 4 MG/2ML Soln injection Commonly known as:  ZOFRAN Inject 2 mLs (4 mg total) into the vein every 6 (six) hours as needed for nausea or vomiting.   Oxycodone HCl 20 MG Tabs Take 1 tablet (20 mg total) by mouth every 4 (four) hours as needed for up to 15 days.   Vitamin D (Ergocalciferol) 1.25 MG (50000 UT) Caps capsule Commonly known as:  DRISDOL Take 1 capsule (50,000 Units total) by mouth every 7 (seven) days.        Consults:  None  Significant Diagnostic Studies:  No results found.   [facility] Course: Stephanie Burch, a 38 year old female with a history of sickle cell anemia and chronic pain syndrome was admitted in sickle cell pain crisis.   Review labs, hemoglobin 10.7 which is consistent with baseline.  No blood transfusion indicated at this time.  All other labs unremarkable. 0.45% saline at 100 mL/h Toradol 30 mg x 1 Dilaudid PCA for pain control  per weight-based protocol with settings of 0.6 mg bolus, 10-minute lockout, and 4 mg/h.  Patient used a total of 11.8 mg. Pain intensity decreased to 4-5/10. Patient alert, oriented, and ambulating without assistance. Patient will discharge with family in a hemodynamically stable condition   Discharge instructions: Strict return precautions. Continue to hydrate with 64 ounces of fluid Resume all home medication  The patient was given clear instructions to go to ER or return to medical center if symptoms do not improve, worsen or new problems develop. The patient verbalized understanding.      All laboratory values reviewed:  Physical Exam at Discharge:  BP (!) 141/89 (BP Location: Left Arm)   Pulse (!) 102   Temp 97.9 F (36.6 C) (Oral)   Resp 12   LMP 02/03/2018   SpO2 98%   Physical Exam Constitutional:      Appearance: Normal appearance.  HENT:     Head: Normocephalic.     Nose: Nose normal.     Mouth/Throat:     Mouth: Mucous membranes are dry.  Eyes:     Pupils: Pupils are equal, round, and reactive to light.  Cardiovascular:     Rate and Rhythm: Normal rate and regular rhythm.     Pulses: Normal pulses.     Heart sounds: Normal heart sounds.  Pulmonary:     Effort: Pulmonary effort is normal.  Breath sounds: Normal breath sounds.  Abdominal:     General: Abdomen is flat. Bowel sounds are normal.  Musculoskeletal: Normal range of motion.  Skin:    General: Skin is warm and dry.  Neurological:     General: No focal deficit present.     Mental Status: She is alert. Mental status is at baseline.  Psychiatric:        Mood and Affect: Mood normal.        Thought Content: Thought content normal.      Disposition at Discharge: Discharge disposition: 01-Home or Self Care       Discharge Orders: Discharge Instructions    Discharge patient   Complete by:  As directed    Discharge disposition:  01-Home or Self Care   Discharge patient date:   02/16/2018      Condition at Discharge:   Stable  Time spent on Discharge:  20 minutes Signed:   Nolon NationsLachina Moore Hollis  APRN, MSN, FNP-C Patient Care [facility] [address]  , [place] [address] [phone] 02/16/2018, 2:54 PM

## 2018-02-16 NOTE — Patient Instructions (Signed)
Hydroxyurea capsules What is this medicine? HYDROXYUREA (hye drox ee yoor EE a) is a chemotherapy drug. This medicine is used to treat certain types of leukemias and head and neck cancer. It is also used to control the painful crises of sickle cell anemia. This medicine may be used for other purposes; ask your health care provider or pharmacist if you have questions. COMMON BRAND NAME(S): Droxia, Hydrea What should I tell my health care provider before I take this medicine? They need to know if you have any of these conditions: -gout or high levels of uric acid in the blood -HIV or AIDS -kidney disease or on hemodialysis -leg wounds or ulcers -low blood counts, like low [place] cell, platelet, or red cell counts -prior or current interferon therapy -recent or ongoing radiation therapy -scheduled to receive a vaccine -an unusual or allergic reaction to hydroxyurea, other medicines, foods, dyes, or preservatives -pregnant or trying to get pregnant -breast-feeding How should I use this medicine? Take this medicine by mouth with a glass of water. Follow the directions on the prescription label. Take your medicine at regular intervals. Do not take it more often than directed. Do not stop taking except on your doctor's advice. People who are not taking this medicine should not be exposed to it. Wash your hands before and after handling your bottle or medicine. Caregivers should wear disposable gloves if they must touch the bottle or medicine. Clean up any medicine powder that spills with a damp disposable towel and throw the towel away in a closed container, such as a plastic bag. A special MedGuide will be given to you by the pharmacist with each prescription and refill. Be sure to read this information carefully each time. Talk to your pediatrician regarding the use of this medicine in children. Special care may be needed. Overdosage: If you think you have taken too much of this medicine contact a  poison control center or emergency room at once. NOTE: This medicine is only for you. Do not share this medicine with others. What if I miss a dose? If you miss a dose, take it as soon as you can. If it is almost time for your next dose, take only that dose. Do not take double or extra doses. What may interact with this medicine? This medicine may also interact with the following medications: -didanosine -stavudine -live virus vaccines This list may not describe all possible interactions. Give your health care provider a list of all the medicines, herbs, non-prescription drugs, or dietary supplements you use. Also tell them if you smoke, drink alcohol, or use illegal drugs. Some items may interact with your medicine. What should I watch for while using this medicine? This drug may make you feel generally unwell. This is not uncommon, as chemotherapy can affect healthy cells as well as cancer cells. Report any side effects. Continue your course of treatment even though you feel ill unless your doctor tells you to stop. You will receive regular blood tests during your treatment. Call your doctor or health care professional for advice if you get a fever, chills or sore throat, or other symptoms of a cold or flu. Do not treat yourself. This drug decreases your body's ability to fight infections. Try to avoid being around people who are sick. This medicine may increase your risk to bruise or bleed. Call your doctor or health care professional if you notice any unusual bleeding. Talk to your doctor about your risk of cancer. You may be more   at risk for certain types of cancers if you take this medicine. Keep out of the sun. If you cannot avoid being in the sun, wear protective clothing and use sunscreen. Do not use sun lamps or tanning beds/booths. Do not become pregnant while taking this medicine or for at least 6 months after stopping it. Women should inform their doctor if they wish to become pregnant  or think they might be pregnant. Men should not father a child while taking this medicine and for at least a year after stopping it. There is a potential for serious side effects to an unborn child. Talk to your health care professional or pharmacist for more information. Do not breast-feed an infant while taking this medicine. This may interfere with the ability to have or father a child. You should talk with your doctor or health care professional if you are concerned about your fertility. What side effects may I notice from receiving this medicine? Side effects that you should report to your doctor or health care professional as soon as possible: -allergic reactions like skin rash, itching or hives, swelling of the face, lips, or tongue -breathing problems -burning, redness or pain at the site of any radiation therapy -low blood counts - this medicine may decrease the number of [place] blood cells, red blood cells and platelets. You may be at increased risk for infections and bleeding. -signs of decreased platelets or bleeding - bruising, pinpoint red spots on the skin, black, tarry stools, blood in the urine -signs of decreased red blood cells - unusually weak or tired, fainting spells, lightheadedness -signs of infection - fever or chills, cough, sore throat, pain or difficulty passing urine -signs and symptoms of bleeding such as bloody or black, tarry stools; red or dark-brown urine; spitting up blood or brown material that looks like coffee grounds; red spots on the skin; unusual bruising or bleeding from the eye, gums, or nose -skin ulcers Side effects that usually do not require medical attention (report to your doctor or health care professional if they continue or are bothersome): -constipation -diarrhea -loss of appetite -mouth sores -nausea This list may not describe all possible side effects. Call your doctor for medical advice about side effects. You may report side effects to FDA at  1-800-FDA-1088. Where should I keep my medicine? Keep out of the reach of children. See product for storage instructions. Each product may have different instructions. Keep tightly closed. Throw away any unused medicine after the expiration date. NOTE: This sheet is a summary. It may not cover all possible information. If you have questions about this medicine, talk to your doctor, pharmacist, or health care provider.  2019 Elsevier/Gold Standard (2017-05-29 16:07:26)  

## 2018-02-16 NOTE — H&P (Signed)
[facility] History and Physical   Date: 02/16/2018  Patient name: Stephanie Burch Medical record number: [id number] Date of birth: [birth year] Age: 38 y.o. Gender: female PCP: Kallie Locks, FNP  Attending physician: Quentin Angst, MD  Chief Complaint: Bilateral lower extremity pain  History of Present Illness: Stephanie Burch, a 38 year old female with a medical history significant for sickle cell anemia, hemoglobin [place], chronic pain syndrome, and morbid obesity presents complaining of bilateral lower extremity pain.  Patient was evaluated in primary care, agree with primary care provider that patient is appropriate to transition to sickle cell day infusion center for pain management and extended observation.  Patient has had worsening pain to bilateral lower extremities over the past several weeks.  Patient was also treated in the day hospital on 02/09/2018 and was offered admission at that time, she was unable to stay due to childcare constraints.  Patient's pain medication was increased to oxycodone 20 mg every 4 hours for severe pain.  She has been taking medication consistently without relief.  She states that she has been unable to rest due to having 3 small children.  Current pain intensity is 8/10 characterized as constant and throbbing.  Patient states that she is unable to get comfortable.  Patient denies headache, chest pain, shortness of breath, dysuria, nausea, vomiting, diarrhea, or paresthesias.  Meds: Medications Prior to Admission  Medication Sig Dispense Refill Last Dose  . cyclobenzaprine (FLEXERIL) 10 MG tablet Take 1 tablet (10 mg total) by mouth 3 (three) times daily as needed for muscle spasms. 30 tablet 2 Taking  . folic acid (FOLVITE) 1 MG tablet Take 1 tablet (1 mg total) by mouth daily. 30 tablet 11 Taking  . ibuprofen (ADVIL,MOTRIN) 800 MG tablet Take 1 tablet (800 mg total) by mouth every 8 (eight) hours as needed for headache, mild pain,  moderate pain or cramping. 30 tablet 3 Taking  . ondansetron (ZOFRAN) 4 MG/2ML SOLN injection Inject 2 mLs (4 mg total) into the vein every 6 (six) hours as needed for nausea or vomiting. 20 mL 0 Taking  . Oxycodone HCl 20 MG TABS Take 1 tablet (20 mg total) by mouth every 4 (four) hours as needed for up to 7 days. 42 tablet 0 Taking  . Vitamin D, Ergocalciferol, (DRISDOL) 50000 units CAPS capsule Take 1 capsule (50,000 Units total) by mouth every 7 (seven) days. 5 capsule 2 Taking    Allergies: Patient has no known allergies. Past Medical History:  Diagnosis Date  . Sickle cell anemia (HCC)    No past surgical history on file. Family History  Problem Relation Age of Onset  . Hypertension Mother   . Sickle cell trait Mother   . Diabetes Father   . Sickle cell trait Father    Social History   Socioeconomic History  . Marital status: Single    Spouse name: Not on file  . Number of children: Not on file  . Years of education: Not on file  . Highest education level: Not on file  Occupational History  . Not on file  Social Needs  . Financial resource strain: Not on file  . Food insecurity:    Worry: Not on file    Inability: Not on file  . Transportation needs:    Medical: Not on file    Non-medical: Not on file  Tobacco Use  . Smoking status: Never Smoker  . Smokeless tobacco: Never Used  Substance and Sexual Activity  . Alcohol  use: Never    Frequency: Never  . Drug use: Never  . Sexual activity: Yes    Birth control/protection: None  Lifestyle  . Physical activity:    Days per week: Not on file    Minutes per session: Not on file  . Stress: Not on file  Relationships  . Social connections:    Talks on phone: Not on file    Gets together: Not on file    Attends religious service: Not on file    Active member of club or organization: Not on file    Attends meetings of clubs or organizations: Not on file    Relationship status: Not on file  . Intimate partner  violence:    Fear of current or ex partner: Not on file    Emotionally abused: Not on file    Physically abused: Not on file    Forced sexual activity: Not on file  Other Topics Concern  . Not on file  Social History Narrative  . Not on file   Review of Systems  Constitutional: Negative.  Negative for chills and fever.  HENT: Negative.   Eyes: Negative.   Respiratory: Negative.   Cardiovascular: Negative.  Negative for orthopnea and claudication.  Gastrointestinal: Negative for abdominal pain and diarrhea.  Musculoskeletal: Positive for joint pain and myalgias.  Neurological: Negative.   Psychiatric/Behavioral: Negative.      Physical Exam: Last menstrual period 02/03/2018. Physical Exam Constitutional:      Appearance: Normal appearance.  HENT:     Head: Normocephalic.     Nose: Nose normal.     Mouth/Throat:     Mouth: Mucous membranes are dry.  Eyes:     Pupils: Pupils are equal, round, and reactive to light.  Neck:     Musculoskeletal: Normal range of motion.  Cardiovascular:     Rate and Rhythm: Normal rate and regular rhythm.     Pulses: Normal pulses.  Pulmonary:     Effort: Pulmonary effort is normal.     Breath sounds: Normal breath sounds.  Abdominal:     General: Abdomen is flat.     Palpations: Abdomen is soft.     Comments: Abdominal obesity  Musculoskeletal: Normal range of motion.  Skin:    General: Skin is warm and dry.  Neurological:     General: No focal deficit present.     Mental Status: She is alert. Mental status is at baseline.  Psychiatric:        Mood and Affect: Mood normal.        Behavior: Behavior normal.        Thought Content: Thought content normal.        Judgment: Judgment normal.      Lab results: Results for orders placed or performed in visit on 02/16/18 (from the past 24 hour(s))  POCT urinalysis dipstick     Status: Normal   Collection Time: 02/16/18  9:21 AM  Result Value Ref Range   Color, UA yellow yellow    Clarity, UA clear clear   Glucose, UA negative negative mg/dL   Bilirubin, UA negative negative   Ketones, POC UA negative negative mg/dL   Spec Grav, UA 4.097 3.532 - 1.025   Blood, UA negative negative   pH, UA 6.5 5.0 - 8.0   Protein Ur, POC negative negative mg/dL   Urobilinogen, UA 1.0 0.2 or 1.0 E.U./dL   Nitrite, UA Negative Negative   Leukocytes, UA Negative Negative  Imaging results:  No results found.   Assessment & Plan:  Patient will be admitted to the day infusion center for extended observation  Start IV 0.45 for cellular rehydration at 75/hr  Start Toradol 30 mg IV every 6 hours for inflammation.  Start Dilaudid PCA High Concentration per weight based protocol.   Patient will be re-evaluated for pain intensity in the context of function and relationship to baseline as care progresses.  If no significant pain relief, will transfer patient to inpatient services for a higher level of care.   Review CMP, reticulocytes and CBC w/differential  Nolon NationsLachina Moore Charlyne Robertshaw  APRN, MSN, FNP-C Patient Care [facility] [address]  [place], [place] [id number] [phone]  02/16/2018, 10:09 AM

## 2018-02-16 NOTE — Discharge Instructions (Signed)
Strict return precautions.  The patient was given clear instructions to go to ER or return to medical center if symptoms do not improve, worsen or new problems develop.   Resume all home medications.  Continue to hydrate with 64 ounces per day     Sickle Cell Anemia, Adult Sickle cell anemia is a condition where your red blood cells are shaped like sickles. Red blood cells carry oxygen through the body. Sickle-shaped cells do not live as long as normal red blood cells. They also clump together and block blood from flowing through the blood vessels. This prevents the body from getting enough oxygen. Sickle cell anemia causes organ damage and pain. It also increases the risk of infection. Follow these instructions at home: Medicines  Take over-the-counter and prescription medicines only as told by your doctor.  If you were prescribed an antibiotic medicine, take it as told by your doctor. Do not stop taking the antibiotic even if you start to feel better.  If you develop a fever, do not take medicines to lower the fever right away. Tell your doctor about the fever. Managing pain, stiffness, and swelling  Try these methods to help with pain: ? Use a heating pad. ? Take a warm bath. ? Distract yourself, such as by watching TV. Eating and drinking  Drink enough fluid to keep your pee (urine) clear or pale yellow. Drink more in hot weather and during exercise.  Limit or avoid alcohol.  Eat a healthy diet. Eat plenty of fruits, vegetables, whole grains, and lean protein.  Take vitamins and supplements as told by your doctor. Traveling  When traveling, keep these with you: ? Your medical information. ? The names of your doctors. ? Your medicines.  If you need to take an airplane, talk to your doctor first. Activity  Rest often.  Avoid exercises that make your heart beat much faster, such as jogging. General instructions  Do not use products that have nicotine or tobacco, such  as cigarettes and e-cigarettes. If you need help quitting, ask your doctor.  Consider wearing a medical alert bracelet.  Avoid being in high places (high altitudes), such as mountains.  Avoid very hot or cold temperatures.  Avoid places where the temperature changes a lot.  Keep all follow-up visits as told by your doctor. This is important. Contact a doctor if:  A joint hurts.  Your feet or hands hurt or swell.  You feel tired (fatigued). Get help right away if:  You have symptoms of infection. These include: ? Fever. ? Chills. ? Being very tired. ? Irritability. ? Poor eating. ? Throwing up (vomiting).  You feel dizzy or faint.  You have new stomach pain, especially on the left side.  You have a an erection (priapism) that lasts more than 4 hours.  You have numbness in your arms or legs.  You have a hard time moving your arms or legs.  You have trouble talking.  You have pain that does not go away when you take medicine.  You are short of breath.  You are breathing fast.  You have a long-term cough.  You have pain in your chest.  You have a bad headache.  You have a stiff neck.  Your stomach looks bloated even though you did not eat much.  Your skin is pale.  You suddenly cannot see well. Summary  Sickle cell anemia is a condition where your red blood cells are shaped like sickles.  Follow your doctor's advice  on ways to manage pain, food to eat, activities to do, and steps to take for safe travel.  Get medical help right away if you have any signs of infection, such as a fever. This information is not intended to replace advice given to you by your health care provider. Make sure you discuss any questions you have with your health care provider. Document Released: 11/10/2012 Document Revised: 02/26/2016 Document Reviewed: 02/26/2016 Elsevier Interactive Patient Education  2019 [employer].

## 2018-02-16 NOTE — Progress Notes (Signed)
Patient admitted to the day infusion hospital for sickle cell pain crisis. Patient initially reported pain in bilateral legs rated 7/10. For pain management, patient placed on Dilaudid PCA, given 30 mg Toradol, 1000 mg Tylenol and hydrated with IV fluids. At discharge, patient rated pain at 5/10. Vital sign stable. Discharge instructions given. Patient alert, oriented and ambulatory.

## 2018-02-16 NOTE — Progress Notes (Signed)
Sickle Cell Anemia Follow Up  Subjective:    Patient ID: Stephanie Burch, female    DOB: [birth year], 38 y.o.   MRN: [id number]   Chief Complaint  Patient presents with  . Follow-up    sickle cell     HPI [name] is a 38 year old female with a past medical history of Sickle Cell Anemia. She is here today for follow up.   Current Status: Since her last office visit, she is doing well with no complaints. She states that she has increasing pain in her legs. She rates her pain today at 6/10. She has not has a [facility] visit for Sickle Cell Crisis since 02/09/2018 where she was treated and discharged the same day. She is currently taking all medications as prescribed and staying well hydrated. She reports occasional dizziness and headaches. She states that her pain medications control her pain if she is resting, but do not help when she has to do her ADLs and take care of her 3 children.    She denies fevers, chills, fatigue, recent infections, weight loss, and night sweats. She has not had any  visual changes, and falls. No chest pain, heart palpitations, cough and shortness of breath reported. No reports of GI problems such as nausea, vomiting, diarrhea, and constipation. She has no reports of blood in stools, dysuria and hematuria. No depression or anxiety reported. She denies pain today.   Review of Systems  Constitutional: Negative.   HENT: Negative.   Eyes: Negative.   Respiratory: Negative.   Cardiovascular: Negative.   Gastrointestinal: Negative.   Endocrine: Negative.   Genitourinary: Negative.   Musculoskeletal: Positive for arthralgias (legs).  Allergic/Immunologic: Negative.   Neurological: Positive for dizziness and headaches.  Hematological: Negative.   Psychiatric/Behavioral: Negative.    Objective:   Physical Exam Vitals signs and nursing note reviewed.  Constitutional:      Appearance: Normal appearance. She is obese.  HENT:     Head: Normocephalic  and atraumatic.     Right Ear: External ear normal.     Left Ear: External ear normal.     Nose: Nose normal.     Mouth/Throat:     Mouth: Mucous membranes are moist.     Pharynx: Oropharynx is clear.  Eyes:     Conjunctiva/sclera: Conjunctivae normal.  Neck:     Musculoskeletal: Normal range of motion.  Cardiovascular:     Rate and Rhythm: Normal rate and regular rhythm.     Pulses: Normal pulses.     Heart sounds: Normal heart sounds.  Pulmonary:     Effort: Pulmonary effort is normal.     Breath sounds: Normal breath sounds.  Abdominal:     General: Bowel sounds are normal.     Palpations: Abdomen is soft.  Musculoskeletal: Normal range of motion.  Skin:    General: Skin is warm and dry.     Capillary Refill: Capillary refill takes less than 2 seconds.  Neurological:     General: No focal deficit present.     Mental Status: She is alert and oriented to person, place, and time.  Psychiatric:        Mood and Affect: Mood normal.        Behavior: Behavior normal.        Thought Content: Thought content normal.        Judgment: Judgment normal.    Assessment & Plan:   1. Hb-SS disease with crisis (HCC) Bilateral leg  pain is increased today. She will be [facility] today for IVFs and possible IV pain medications for management of sickle cell crisis. She will continue to take pain medications as prescribed; will continue to avoid extreme heat and cold; will continue to eat a healthy diet and drink at least 64 ounces of water daily; continue stool softener as needed; will avoid colds and flu; will continue to get plenty of sleep and rest; will continue to avoid high stressful situations and remain infection free; will continue Folic Acid 1 mg daily to avoid sickle cell crisis.   We will initiate Hydrea today, in an attempt for better home pain management, to decreased hospital admission rates, decrease vaso-occlusive pain, decrease incidents of chest  syndrome, and incidents of fever. She will take as prescribed.   2. Chronic pain syndrome  3. Chronic, continuous use of opioids  4. Class 3 severe obesity due to excess calories with serious comorbidity and body mass index (BMI) of 45.0 to 49.9 in adult [facility]) Body mass index is 45.93 kg/m.  Goal BMI  is <30. Encouraged efforts to reduce weight include engaging in physical activity as tolerated with goal of 150 minutes per week. Improve dietary choices and eat a meal regimen consistent with a Mediterranean or DASH diet. Reduce simple carbohydrates. Do not skip meals and eat healthy snacks throughout the day to avoid over-eating at dinner. Set a goal weight loss that is achievable for you.  5. Follow up She will follow up in 1 month for assessment of effectiveness of Hydrea.  - POCT urinalysis dipstick  [name],  MSN, FNP-C Patient Care [facility] [address] [place], [place] [address] [phone]

## 2018-02-18 ENCOUNTER — Other Ambulatory Visit: Payer: Self-pay | Admitting: Family Medicine

## 2018-02-18 DIAGNOSIS — G894 Chronic pain syndrome: Secondary | ICD-10-CM

## 2018-02-18 DIAGNOSIS — F1190 Opioid use, unspecified, uncomplicated: Secondary | ICD-10-CM

## 2018-02-18 DIAGNOSIS — D5700 Hb-SS disease with crisis, unspecified: Secondary | ICD-10-CM

## 2018-02-18 MED ORDER — OXYCODONE HCL 20 MG PO TABS: 20.0000 mg | ORAL_TABLET | ORAL | 0 refills | Status: DC | PRN

## 2018-02-18 NOTE — Telephone Encounter (Signed)
Pharmacy states that the script for Oxycodone has to be resent with the diagnosis code for sickle cell for Medicaid to pay for it.  [facility] on [address]

## 2018-02-19 ENCOUNTER — Non-Acute Institutional Stay (HOSPITAL_COMMUNITY)
Admission: AD | Admit: 2018-02-19 | Discharge: 2018-02-19 | Disposition: A | Discharge status: Home / Self Care | Payer: Medicaid Other | Source: Ambulatory Visit | Attending: Internal Medicine | Admitting: Internal Medicine

## 2018-02-19 ENCOUNTER — Telehealth (HOSPITAL_COMMUNITY): Payer: Self-pay

## 2018-02-19 ENCOUNTER — Encounter (HOSPITAL_COMMUNITY): Payer: Self-pay | Admitting: *Deleted

## 2018-02-19 ENCOUNTER — Other Ambulatory Visit: Payer: Self-pay | Admitting: Family Medicine

## 2018-02-19 DIAGNOSIS — M79651 Pain in right thigh: Secondary | ICD-10-CM | POA: Diagnosis not present

## 2018-02-19 DIAGNOSIS — G894 Chronic pain syndrome: Secondary | ICD-10-CM | POA: Insufficient documentation

## 2018-02-19 DIAGNOSIS — Z791 Long term (current) use of non-steroidal anti-inflammatories (NSAID): Secondary | ICD-10-CM | POA: Diagnosis not present

## 2018-02-19 DIAGNOSIS — F1190 Opioid use, unspecified, uncomplicated: Secondary | ICD-10-CM

## 2018-02-19 DIAGNOSIS — D5700 Hb-SS disease with crisis, unspecified: Secondary | ICD-10-CM | POA: Insufficient documentation

## 2018-02-19 DIAGNOSIS — Z79899 Other long term (current) drug therapy: Secondary | ICD-10-CM | POA: Insufficient documentation

## 2018-02-19 LAB — CBC WITH DIFFERENTIAL/PLATELET
Abs Immature Granulocytes: 0.03 10*3/uL (ref 0.00–0.07)
Basophils Absolute: 0 10*3/uL (ref 0.0–0.1)
Basophils Relative: 1 %
EOS ABS: 0.1 10*3/uL (ref 0.0–0.5)
Eosinophils Relative: 1 %
HCT: 34.6 % — ABNORMAL LOW (ref 36.0–46.0)
Hemoglobin: 11.4 g/dL — ABNORMAL LOW (ref 12.0–15.0)
Immature Granulocytes: 1 %
Lymphocytes Relative: 25 %
Lymphs Abs: 1.6 10*3/uL (ref 0.7–4.0)
MCH: 23.4 pg — ABNORMAL LOW (ref 26.0–34.0)
MCHC: 32.9 g/dL (ref 30.0–36.0)
MCV: 70.9 fL — AB (ref 80.0–100.0)
Monocytes Absolute: 0.4 10*3/uL (ref 0.1–1.0)
Monocytes Relative: 6 %
Neutro Abs: 4.3 10*3/uL (ref 1.7–7.7)
Neutrophils Relative %: 66 %
Platelets: 209 10*3/uL (ref 150–400)
RBC: 4.88 MIL/uL (ref 3.87–5.11)
RDW: 15 % (ref 11.5–15.5)
WBC: 6.4 10*3/uL (ref 4.0–10.5)
nRBC: 0 % (ref 0.0–0.2)

## 2018-02-19 LAB — COMPREHENSIVE METABOLIC PANEL
ALT: 14 U/L (ref 0–44)
ANION GAP: 9 (ref 5–15)
AST: 28 U/L (ref 15–41)
Albumin: 3.9 g/dL (ref 3.5–5.0)
Alkaline Phosphatase: 63 U/L (ref 38–126)
BUN: 9 mg/dL (ref 6–20)
CALCIUM: 9 mg/dL (ref 8.9–10.3)
CO2: 24 mmol/L (ref 22–32)
Chloride: 107 mmol/L (ref 98–111)
Creatinine, Ser: 0.68 mg/dL (ref 0.44–1.00)
GFR calc Af Amer: >60 mL/min (ref >60)
Glucose, Bld: 85 mg/dL (ref 70–99)
Potassium: 4.9 mmol/L (ref 3.5–5.1)
Sodium: 140 mmol/L (ref 135–145)
TOTAL PROTEIN: 7.6 g/dL (ref 6.5–8.1)
Total Bilirubin: 1 mg/dL (ref 0.3–1.2)

## 2018-02-19 LAB — LACTATE DEHYDROGENASE: LDH: 278 U/L — ABNORMAL HIGH (ref 98–192)

## 2018-02-19 MED ORDER — OXYCODONE HCL 10 MG PO TABS: 20.0000 mg | ORAL_TABLET | ORAL | 0 refills | Status: DC | PRN

## 2018-02-19 MED ORDER — SODIUM CHLORIDE 0.9% FLUSH: 9.0000 mL | INTRAVENOUS | Status: DC | PRN

## 2018-02-19 MED ORDER — SODIUM CHLORIDE 0.45 % IV SOLN
INTRAVENOUS | Status: DC
  Administered 2018-02-19: 10:00:00 via INTRAVENOUS

## 2018-02-19 MED ORDER — OXYCODONE HCL 5 MG PO TABS
10.0000 mg | ORAL_TABLET | Freq: Once | ORAL | Status: AC
  Administered 2018-02-19: 10 mg via ORAL
  Filled 2018-02-19: qty 2

## 2018-02-19 MED ORDER — HYDROMORPHONE 1 MG/ML IV SOLN
INTRAVENOUS | Status: DC
  Administered 2018-02-19: 30 mg via INTRAVENOUS
  Administered 2018-02-19: 11.2 mg via INTRAVENOUS
  Filled 2018-02-19: qty 30

## 2018-02-19 MED ORDER — ONDANSETRON HCL 4 MG/2ML IJ SOLN
4.0000 mg | Freq: Four times a day (QID) | INTRAMUSCULAR | Status: DC | PRN
  Administered 2018-02-19: 4 mg via INTRAVENOUS
  Filled 2018-02-19: qty 2

## 2018-02-19 MED ORDER — ACETAMINOPHEN 500 MG PO TABS
1000.0000 mg | ORAL_TABLET | Freq: Once | ORAL | Status: AC
  Administered 2018-02-19: 1000 mg via ORAL
  Filled 2018-02-19: qty 2

## 2018-02-19 MED ORDER — SODIUM CHLORIDE 0.9 % IV SOLN
25.0000 mg | INTRAVENOUS | Status: DC | PRN
  Filled 2018-02-19: qty 0.5

## 2018-02-19 MED ORDER — KETOROLAC TROMETHAMINE 30 MG/ML IJ SOLN
30.0000 mg | Freq: Once | INTRAMUSCULAR | Status: AC
  Administered 2018-02-19: 30 mg via INTRAVENOUS
  Filled 2018-02-19: qty 1

## 2018-02-19 MED ORDER — DIPHENHYDRAMINE HCL 25 MG PO CAPS: 25.0000 mg | ORAL_CAPSULE | ORAL | Status: DC | PRN

## 2018-02-19 MED ORDER — NALOXONE HCL 0.4 MG/ML IJ SOLN: 0.4000 mg | INTRAMUSCULAR | Status: DC | PRN

## 2018-02-19 NOTE — Telephone Encounter (Signed)
Stephanie Burch changed dose of script and it is ready for pick up for patient. Patient has been notified

## 2018-02-19 NOTE — Telephone Encounter (Signed)
Patient telephoned for triage to [facility] for treatment of sickle cell pain.  Complaint of pain to legs rated 8 out of 10.  Has not had medication due to difficulty with getting prescription filled.  Denies shortness of breath, chest pain, nausea, vomiting, diarrhea, or abdominal pain.  Spoke with [place] Hollis FNP-C at [phone] and she OK'd patient to come in for treatment.  Patient advised of same at [phone].

## 2018-02-19 NOTE — Discharge Instructions (Signed)
Your primary care provider has sent oxycodone to your pharmacy.  You should be able to pick that up this afternoon.  Continue to hydrate with 64 ounces of water.  All of your laboratory values are consistent with your baseline.  Follow-up in primary care as scheduled. I recommend that you reduce your body weight by 10 to 15% over the next 3 months in order to assist with alleviating lower extremity pain.  Recommend 5-6 small meals throughout the day and increase your water intake.  Avoid increased carbohydrates and sugary drinks.  Sickle Cell Anemia, Adult  Sickle cell anemia is a condition in which red blood cells have an abnormal sickle shape. Red blood cells carry oxygen through the body. Sickle-shaped red blood cells do not live as long as normal red blood cells. They also clump together and block blood from flowing through the blood vessels. This condition prevents the body from getting enough oxygen. Sickle cell anemia causes organ damage and pain. It also increases the risk of infection. What are the causes? This condition is caused by a gene that is passed from parent to child (inherited). Receiving two copies of the gene causes the disease. Receiving one copy causes the "trait," which means that symptoms are milder or not present. What increases the risk? This condition is more likely to develop if your ancestors were from [place], the [place], [place] or [place], the [place], [place], or the [place]. What are the signs or symptoms? Symptoms of this condition include:  Episodes of pain (crises), especially in the hands and feet, joints, back, chest, or abdomen. The pain can be triggered by: ? An illness, especially if there is dehydration. ? Doing an activity with great effort (overexertion). ? Exposure to extreme temperature changes. ? High altitude.  Fatigue.  Shortness of breath or difficulty breathing.  Dizziness.  Pale skin or yellowed skin  (jaundice).  Frequent bacterial infections.  Pain and swelling in the hands and feet (hand-food syndrome).  Prolonged, painful erection of the penis (priapism).  Acute chest syndrome. Symptoms of this include: ? Chest pain. ? Fever. ? Cough. ? Fast breathing.  Stroke.  Decreased activity.  Loss of appetite.  Change in behavior.  Headaches.  Seizures.  Vision changes.  Skin ulcers.  Heart disease.  High blood pressure.  Gallstones.  Liver and kidney problems. How is this diagnosed? This condition is diagnosed with blood tests that check for the gene that causes this condition. How is this treated? There is no cure for most cases of this condition. Treatment focuses on managing your symptoms and preventing complications of the disease. Your health care provider will work with you to identify the best treatment options for you based on an assessment of your condition. Treatment may include:  Medicines, including: ? Pain medicines. ? Antibiotic medicines for infection. ? Medicines to increase the production of a protein in red blood cells that helps carry oxygen in the body (hemoglobin).  Fluids to treat pain and swelling.  Oxygen to treat acute chest syndrome.  Blood transfusions to treat symptoms such as fatigue, stroke, and acute chest syndrome.  Massage and physical therapy for pain.  Regular tests to monitor your condition, such as blood tests, X-rays, CT scans, MRI scans, ultrasounds, and lung function tests. These should be done every 3-12 months, depending on your age.  Hematopoietic stem cell transplant. This is a procedure to replace abnormal stem cells with healthy stem cells from a donor's bone marrow. Stem cells are  cells that can develop into blood cells, and bone marrow is the spongy tissue inside the bones. Follow these instructions at home: Medicines  Take over-the-counter and prescription medicines only as told by your health care  provider.  If you were prescribed an antibiotic medicine, take it as told by your health care provider. Do not stop taking the antibiotic even if you start to feel better.  If you develop a fever, do not take medicines to reduce the fever right away. This could cover up another problem. Notify your health care provider. Managing pain, stiffness, and swelling  Try these methods to help ease your pain: ? Using a heating pad. ? Taking a warm bath. ? Distracting yourself, such as by watching TV. Eating and drinking  Drink enough fluid to keep your urine clear or pale yellow. Drink more in hot weather and during exercise.  Limit or avoid drinking alcohol.  Eat a balanced and nutritious diet. Eat plenty of fruits, vegetables, whole grains, and lean protein.  Take vitamins and supplements as directed by your health care provider. Traveling  When traveling, keep these with you: ? Your medical information. ? The names of your health care providers. ? Your medicines.  If you have to travel by air, ask about precautions you should take. Activity  Get plenty of rest.  Avoid activities that will lower your oxygen levels, such as exercising vigorously. General instructions  Do not use any products that contain nicotine or tobacco, such as cigarettes and e-cigarettes. They lower blood oxygen levels. If you need help quitting, ask your health care provider.  Consider wearing a medical alert bracelet.  Avoid high altitudes.  Avoid extreme temperatures and extreme temperature changes.  Keep all follow-up visits as told by your health care provider. This is important. Contact a health care provider if:  You develop joint pain.  Your feet or hands swell or have pain.  You have fatigue. Get help right away if:  You have symptoms of infection. These include: ? Fever. ? Chills. ? Extreme tiredness. ? Irritability. ? Poor eating. ? Vomiting.  You feel dizzy or faint.  You have  new abdominal pain, especially on the left side near the stomach area.  You develop priapism.  You have numbness in your arms or legs or have trouble moving them.  You have trouble talking.  You develop pain that cannot be controlled with medicine.  You become short of breath.  You have rapid breathing.  You have a persistent cough.  You have pain in your chest.  You develop a severe headache or stiff neck.  You feel bloated without eating or after eating a small amount of food.  Your skin is pale.  You suddenly lose vision. Summary  Sickle cell anemia is a condition in which red blood cells have an abnormal sickle shape. This disease can cause organ damage and chronic pain, and it can raise your risk of infection.  Sickle cell anemia is a genetic disorder.  Treatment focuses on managing your symptoms and preventing complications of the disease.  Get medical help right away if you have any signs of infection, such as a fever. This information is not intended to replace advice given to you by your health care provider. Make sure you discuss any questions you have with your health care provider. Document Released: 04/30/2005 Document Revised: 02/26/2016 Document Reviewed: 02/26/2016 Elsevier Interactive Patient Education  2019 [employer].

## 2018-02-19 NOTE — Telephone Encounter (Signed)
Spoke with Jill AlexandersJustin and he gave me the code for the correct medication that has to be sent and I have given information to [facility] and she will order new script.

## 2018-02-19 NOTE — H&P (Signed)
[facility] History and Physical   Date: 02/19/2018  Patient name: Stephanie Burch Medical record number: [id number] Date of birth: May 16, 1980 Age: 38 y.o. Gender: female PCP: Kallie Locks, FNP  Attending physician: Quentin Angst, MD  Chief Complaint: Right leg pain  History of Present Illness: Stephanie Burch, a 38 year old female with a medical history significant of sickle cell anemia, hemoglobin [place], chronic pain syndrome, morbid obesity presents complaining of right thigh pain.  Patient was treated and evaluated in the day infusion center on 02/16/2018 for this problem.  Patient states that pain resolved following discharge.  However, she was out of home pain medications.  Patient was unable to pick up prescription due to insurance constraints.  Primary provider is aware and is working on resolving the problem. Current pain intensity is 9/10 characterized as constant and sharp.  Patient states that pain is worsened with weightbearing.  She denies headache, chest pain, shortness of breath, nausea, vomiting, or diarrhea. Meds: Medications Prior to Admission  Medication Sig Dispense Refill Last Dose  . cyclobenzaprine (FLEXERIL) 10 MG tablet Take 1 tablet (10 mg total) by mouth 3 (three) times daily as needed for muscle spasms. 30 tablet 2   . folic acid (FOLVITE) 1 MG tablet Take 1 tablet (1 mg total) by mouth daily. 30 tablet 11 Taking  . hydroxyurea (HYDREA) 500 MG capsule Take 4 capsules (2,000 mg total) by mouth daily. May take with food to minimize GI side effects. 120 capsule 1   . ibuprofen (ADVIL,MOTRIN) 800 MG tablet Take 1 tablet (800 mg total) by mouth every 8 (eight) hours as needed for headache, mild pain, moderate pain or cramping. 30 tablet 3 Taking  . ondansetron (ZOFRAN) 4 MG tablet Take 1 tablet (4 mg total) by mouth every 8 (eight) hours as needed for nausea or vomiting. 20 tablet 0   . ondansetron (ZOFRAN) 4 MG/2ML SOLN injection Inject 2 mLs (4 mg  total) into the vein every 6 (six) hours as needed for nausea or vomiting. 20 mL 2   . Oxycodone HCl 20 MG TABS Take 1 tablet (20 mg total) by mouth every 4 (four) hours as needed for up to 15 days. 90 tablet 0   . Vitamin D, Ergocalciferol, (DRISDOL) 50000 units CAPS capsule Take 1 capsule (50,000 Units total) by mouth every 7 (seven) days. 5 capsule 2 Taking    Allergies: Patient has no known allergies. Past Medical History:  Diagnosis Date  . Sickle cell anemia (HCC)    No past surgical history on file. Family History  Problem Relation Age of Onset  . Hypertension Mother   . Sickle cell trait Mother   . Diabetes Father   . Sickle cell trait Father    Social History   Socioeconomic History  . Marital status: Single    Spouse name: Not on file  . Number of children: Not on file  . Years of education: Not on file  . Highest education level: Not on file  Occupational History  . Not on file  Social Needs  . Financial resource strain: Not on file  . Food insecurity:    Worry: Not on file    Inability: Not on file  . Transportation needs:    Medical: Not on file    Non-medical: Not on file  Tobacco Use  . Smoking status: Never Smoker  . Smokeless tobacco: Never Used  Substance and Sexual Activity  . Alcohol use: Never    Frequency: Never  .  Drug use: Never  . Sexual activity: Yes    Birth control/protection: None  Lifestyle  . Physical activity:    Days per week: Not on file    Minutes per session: Not on file  . Stress: Not on file  Relationships  . Social connections:    Talks on phone: Not on file    Gets together: Not on file    Attends religious service: Not on file    Active member of club or organization: Not on file    Attends meetings of clubs or organizations: Not on file    Relationship status: Not on file  . Intimate partner violence:    Fear of current or ex partner: Not on file    Emotionally abused: Not on file    Physically abused: Not on file     Forced sexual activity: Not on file  Other Topics Concern  . Not on file  Social History Narrative  . Not on file    Review of Systems: Review of Systems  Constitutional: Negative.   HENT: Negative.   Eyes: Negative.   Cardiovascular: Negative.   Gastrointestinal: Negative.   Genitourinary: Negative.   Musculoskeletal: Positive for myalgias.  Skin: Negative.   Neurological: Negative.   Endo/Heme/Allergies: Negative.   Psychiatric/Behavioral: Negative.      Physical Exam: Blood pressure 117/83, pulse (!) 102, temperature 98.3 F (36.8 C), temperature source Oral, last menstrual period 02/03/2018, SpO2 100 %.  Physical Exam HENT:     Head: Normocephalic.     Right Ear: Tympanic membrane normal.     Left Ear: Tympanic membrane normal.     Nose: Nose normal.     Mouth/Throat:     Mouth: Mucous membranes are moist.  Neck:     Musculoskeletal: Normal range of motion and neck supple.  Cardiovascular:     Rate and Rhythm: Normal rate.     Pulses: Normal pulses.     Heart sounds: Normal heart sounds.  Abdominal:     General: Bowel sounds are normal.  Musculoskeletal: Normal range of motion.  Skin:    General: Skin is warm.  Neurological:     General: No focal deficit present.     Mental Status: She is alert.  Psychiatric:        Mood and Affect: Mood normal.        Behavior: Behavior normal.        Thought Content: Thought content normal.        Judgment: Judgment normal.     Lab results: No results found for this or any previous visit (from the past 24 hour(s)).  Imaging results:  No results found.   Assessment & Plan:  Patient will be admitted to the day infusion center for extended observation  Start IV 0.45% saline at 50 ml /hr  Start Toradol 30 mg IV x1.  Start Dilaudid PCA High Concentration per weight based protocol.   Patient will be re-evaluated for pain intensity in the context of function and relationship to baseline as care  progresses.  If no significant pain relief, will transfer patient to inpatient services for a higher level of care.   Will review CMP, CBC, and reticulocytes  Nolon Nations  APRN, MSN, FNP-C Patient [facility] [address] [place], [place] [address] [phone]  02/19/2018, 9:21 AM

## 2018-02-19 NOTE — Telephone Encounter (Signed)
Spoke with Adair Laundry from sickle cell agency and she states that the pharmacy is saying PA needs to be resubmitted through sickle medicaid and I explain that what it was submitted through. I have reach out to [facility] through Sick[employer] cell [employer] and waiting for him to return my call.

## 2018-02-19 NOTE — Progress Notes (Signed)
Patient admitted to the day infusion hospital for sickle cell pain crisis. Patient initially reported bilateral leg pain rated 8/10. For pain management, patient placed on Dilaudid PCA, given 1000 mg Tylenol, 30 mg Toradol, 10 mg Oxycodone and hydrated with IV fluids. At discharge, patient reported pain level at 4/10. Vital signs stable. Discharge instructions given. Prescription for Oxycodone 20 mg sent to patient's pharmacy. Patient alert, oriented and ambulatory at discharge.

## 2018-02-19 NOTE — Discharge Summary (Signed)
[facility] Discharge Summary   Patient ID: Stephanie Burch MRN: [id number] DOB/AGE: December 28, 1980 38 y.o.  Admit date: 02/19/2018 Discharge date: 02/19/2018  Primary Care Physician:  Kallie LocksStroud, [name], FNP  Admission Diagnoses:  Active Problems:   Sickle cell pain crisis [facility](HCC)   Discharge Medications:  Allergies as of 02/19/2018   No Known Allergies     Medication List    TAKE these medications   cyclobenzaprine 10 MG tablet Commonly known as:  FLEXERIL Take 1 tablet (10 mg total) by mouth 3 (three) times daily as needed for muscle spasms.   folic acid 1 MG tablet Commonly known as:  FOLVITE Take 1 tablet (1 mg total) by mouth daily.   hydroxyurea 500 MG capsule Commonly known as:  HYDREA Take 4 capsules (2,000 mg total) by mouth daily. May take with food to minimize GI side effects.   ibuprofen 800 MG tablet Commonly known as:  ADVIL,MOTRIN Take 1 tablet (800 mg total) by mouth every 8 (eight) hours as needed for headache, mild pain, moderate pain or cramping.   ondansetron 4 MG/2ML Soln injection Commonly known as:  ZOFRAN Inject 2 mLs (4 mg total) into the vein every 6 (six) hours as needed for nausea or vomiting.   ondansetron 4 MG tablet Commonly known as:  ZOFRAN Take 1 tablet (4 mg total) by mouth every 8 (eight) hours as needed for nausea or vomiting.   Oxycodone HCl 20 MG Tabs Take 1 tablet (20 mg total) by mouth every 4 (four) hours as needed for up to 15 days. What changed:  Another medication with the same name was added. Make sure you understand how and when to take each.   Oxycodone HCl 10 MG Tabs Take 2 tablets (20 mg total) by mouth every 4 (four) hours as needed. What changed:  You were already taking a medication with the same name, and this prescription was added. Make sure you understand how and when to take each.   Vitamin D (Ergocalciferol) 1.25 MG (50000 UT) Caps capsule Commonly known as:  DRISDOL Take 1 capsule (50,000  Units total) by mouth every 7 (seven) days.        Consults:  None  Significant Diagnostic Studies:  No results found.   [facility] Course: Stephanie StarchKimaada Trapp, a 38 year old female with a history of sickle cell anemia and chronic pain syndrome was admitted in sickle cell pain crisis.   Review labs, hemoglobin 10.7 which is consistent with baseline.  No blood transfusion indicated at this time.  All other labs unremarkable. 0.45% saline at 50 mL/h Toradol 30 mg x 1 Dilaudid PCA for pain control per weight-based protocol with settings of 0.6 mg bolus, 10-minute lockout, and 4 mg/h.  Patient used a total of 11.2 mg with 21 demands and 17 delivered. Pain intensity decreased to 4/10. Patient alert, oriented, and ambulating without assistance. Patient will discharge with family in a hemodynamically stable condition   Discharge instructions: Strict return precautions. Discussed pain management regimen with patient's primary care provider.  She is sent oxycodone 20 mg every 4 hours to pharmacy.  Advised patient to take medications consistently. Continue to hydrate with 64 ounces of fluid Resume all home medication  The patient was given clear instructions to go to ER or return to medical center if symptoms do not improve, worsen or new problems develop. The patient verbalized understanding.      All laboratory values reviewed:  Physical Exam at Discharge:  BP 135/72 (BP Location: Left Arm)   Pulse 94   Temp 98.3 F (36.8 C) (Oral)   Resp 16   LMP 02/03/2018   SpO2 92%   Physical Exam Constitutional:      Appearance: Normal appearance.  HENT:     Head: Normocephalic.     Nose: Nose normal.     Mouth/Throat:     Mouth: Mucous membranes are moist.  Eyes:     Pupils: Pupils are equal, round, and reactive to light.  Cardiovascular:     Rate and Rhythm: Normal rate and regular rhythm.     Pulses: Normal pulses.     Heart sounds: Normal heart sounds.   Pulmonary:     Effort: Pulmonary effort is normal.     Breath sounds: Normal breath sounds.  Abdominal:     General: Abdomen is flat. Bowel sounds are normal.  Musculoskeletal: Normal range of motion.  Skin:    General: Skin is warm and dry.  Neurological:     General: No focal deficit present.     Mental Status: She is alert. Mental status is at baseline.  Psychiatric:        Mood and Affect: Mood normal.        Thought Content: Thought content normal.      Disposition at Discharge: Discharge disposition: 01-Home or Self Care       Discharge Orders: Discharge Instructions    Discharge patient   Complete by:  As directed    Discharge disposition:  01-Home or Self Care   Discharge patient date:  02/19/2018      Condition at Discharge:   Stable  Time spent on Discharge:  20 minutes Signed:   Nolon Nations  APRN, MSN, FNP-C Patient Care [facility] [address] [place], [place] [address] [phone] 02/19/2018, 3:55 PM

## 2018-03-03 ENCOUNTER — Telehealth: Payer: Self-pay

## 2018-03-03 NOTE — Telephone Encounter (Signed)
Patient needs a refill Oxycodone 10mg  and she needs to pickup on Friday because she is heading out of town on Saturday Morning. [facility] [address]   Patient was made aware that she needs to call in her medication on Tuesday.

## 2018-03-04 ENCOUNTER — Other Ambulatory Visit: Payer: Self-pay | Admitting: Family Medicine

## 2018-03-04 DIAGNOSIS — G894 Chronic pain syndrome: Secondary | ICD-10-CM

## 2018-03-04 DIAGNOSIS — D5700 Hb-SS disease with crisis, unspecified: Secondary | ICD-10-CM

## 2018-03-04 DIAGNOSIS — F1190 Opioid use, unspecified, uncomplicated: Secondary | ICD-10-CM

## 2018-03-04 MED ORDER — OXYCODONE HCL 10 MG PO TABS: 20.0000 mg | ORAL_TABLET | ORAL | 0 refills | Status: DC | PRN

## 2018-03-04 NOTE — Telephone Encounter (Signed)
Patient notified

## 2018-03-10 ENCOUNTER — Other Ambulatory Visit: Payer: Self-pay | Admitting: Family Medicine

## 2018-03-10 DIAGNOSIS — F1190 Opioid use, unspecified, uncomplicated: Secondary | ICD-10-CM

## 2018-03-10 DIAGNOSIS — D5700 Hb-SS disease with crisis, unspecified: Secondary | ICD-10-CM

## 2018-03-10 DIAGNOSIS — R110 Nausea: Secondary | ICD-10-CM

## 2018-03-10 DIAGNOSIS — G894 Chronic pain syndrome: Secondary | ICD-10-CM

## 2018-03-16 ENCOUNTER — Encounter: Payer: Self-pay | Admitting: Family Medicine

## 2018-03-16 ENCOUNTER — Ambulatory Visit: Payer: Medicaid Other | Admitting: Family Medicine

## 2018-03-16 ENCOUNTER — Ambulatory Visit (INDEPENDENT_AMBULATORY_CARE_PROVIDER_SITE_OTHER): Payer: Self-pay | Admitting: Family Medicine

## 2018-03-16 ENCOUNTER — Encounter (HOSPITAL_COMMUNITY): Payer: Self-pay | Admitting: *Deleted

## 2018-03-16 ENCOUNTER — Inpatient Hospital Stay (HOSPITAL_COMMUNITY)
Admission: AD | Admit: 2018-03-16 | Discharge: 2018-03-18 | DRG: 812 | Disposition: A | Discharge status: Home / Self Care | Payer: Medicaid Other | Source: Ambulatory Visit | Attending: Internal Medicine | Admitting: Internal Medicine

## 2018-03-16 VITALS — BP 142/90 | HR 110 | Temp 98.4°F | Ht 65.0 in | Wt 279.0 lb

## 2018-03-16 DIAGNOSIS — Z09 Encounter for follow-up examination after completed treatment for conditions other than malignant neoplasm: Secondary | ICD-10-CM

## 2018-03-16 DIAGNOSIS — G894 Chronic pain syndrome: Secondary | ICD-10-CM | POA: Insufficient documentation

## 2018-03-16 DIAGNOSIS — Z832 Family history of diseases of the blood and blood-forming organs and certain disorders involving the immune mechanism: Secondary | ICD-10-CM | POA: Diagnosis not present

## 2018-03-16 DIAGNOSIS — Z833 Family history of diabetes mellitus: Secondary | ICD-10-CM

## 2018-03-16 DIAGNOSIS — F1190 Opioid use, unspecified, uncomplicated: Secondary | ICD-10-CM | POA: Insufficient documentation

## 2018-03-16 DIAGNOSIS — D638 Anemia in other chronic diseases classified elsewhere: Secondary | ICD-10-CM | POA: Diagnosis present

## 2018-03-16 DIAGNOSIS — Z6841 Body Mass Index (BMI) 40.0 and over, adult: Secondary | ICD-10-CM

## 2018-03-16 DIAGNOSIS — D5700 Hb-SS disease with crisis, unspecified: Secondary | ICD-10-CM | POA: Diagnosis present

## 2018-03-16 DIAGNOSIS — Z8249 Family history of ischemic heart disease and other diseases of the circulatory system: Secondary | ICD-10-CM | POA: Diagnosis not present

## 2018-03-16 LAB — COMPREHENSIVE METABOLIC PANEL
ALT: 20 U/L (ref 0–44)
AST: 16 U/L (ref 15–41)
Albumin: 3.8 g/dL (ref 3.5–5.0)
Alkaline Phosphatase: 62 U/L (ref 38–126)
Anion gap: 6 (ref 5–15)
BUN: 6 mg/dL (ref 6–20)
CO2: 28 mmol/L (ref 22–32)
Calcium: 9 mg/dL (ref 8.9–10.3)
Chloride: 104 mmol/L (ref 98–111)
Creatinine, Ser: 0.56 mg/dL (ref 0.44–1.00)
GFR calc Af Amer: >60 mL/min (ref >60)
GFR calc non Af Amer: >60 mL/min (ref >60)
Glucose, Bld: 98 mg/dL (ref 70–99)
Potassium: 4.3 mmol/L (ref 3.5–5.1)
Sodium: 138 mmol/L (ref 135–145)
Total Bilirubin: 0.8 mg/dL (ref 0.3–1.2)
Total Protein: 7.4 g/dL (ref 6.5–8.1)

## 2018-03-16 LAB — RETICULOCYTES
Immature Retic Fract: 27.5 % — ABNORMAL HIGH (ref 2.3–15.9)
RBC.: 4.65 MIL/uL (ref 3.87–5.11)
RETIC CT PCT: 3.5 % — AB (ref 0.4–3.1)
Retic Count, Absolute: 160.4 10*3/uL (ref 19.0–186.0)

## 2018-03-16 LAB — CBC WITH DIFFERENTIAL/PLATELET
Abs Immature Granulocytes: 0.04 10*3/uL (ref 0.00–0.07)
Basophils Absolute: 0 10*3/uL (ref 0.0–0.1)
Basophils Relative: 0 %
EOS ABS: 0.1 10*3/uL (ref 0.0–0.5)
Eosinophils Relative: 2 %
HCT: 33.2 % — ABNORMAL LOW (ref 36.0–46.0)
Hemoglobin: 11.2 g/dL — ABNORMAL LOW (ref 12.0–15.0)
Immature Granulocytes: 1 %
Lymphocytes Relative: 25 %
Lymphs Abs: 1.8 10*3/uL (ref 0.7–4.0)
MCH: 24.1 pg — ABNORMAL LOW (ref 26.0–34.0)
MCHC: 33.7 g/dL (ref 30.0–36.0)
MCV: 71.4 fL — ABNORMAL LOW (ref 80.0–100.0)
Monocytes Absolute: 0.5 10*3/uL (ref 0.1–1.0)
Monocytes Relative: 8 %
Neutro Abs: 4.5 10*3/uL (ref 1.7–7.7)
Neutrophils Relative %: 64 %
Platelets: 211 10*3/uL (ref 150–400)
RBC: 4.65 MIL/uL (ref 3.87–5.11)
RDW: 14.3 % (ref 11.5–15.5)
WBC: 7 10*3/uL (ref 4.0–10.5)
nRBC: 0 % (ref 0.0–0.2)

## 2018-03-16 LAB — POCT URINALYSIS DIP (MANUAL ENTRY)
Bilirubin, UA: NEGATIVE
Blood, UA: NEGATIVE
Glucose, UA: NEGATIVE mg/dL
Ketones, POC UA: NEGATIVE mg/dL
Leukocytes, UA: NEGATIVE
Nitrite, UA: NEGATIVE
Protein Ur, POC: NEGATIVE mg/dL
Spec Grav, UA: 1.015 (ref 1.010–1.025)
Urobilinogen, UA: 1 E.U./dL
pH, UA: 7 (ref 5.0–8.0)

## 2018-03-16 LAB — HCG, SERUM, QUALITATIVE: Preg, Serum: NEGATIVE

## 2018-03-16 MED ORDER — SODIUM CHLORIDE 0.9 % IV SOLN
25.0000 mg | INTRAVENOUS | Status: DC | PRN
  Filled 2018-03-16: qty 0.5

## 2018-03-16 MED ORDER — SODIUM CHLORIDE 0.9% FLUSH: 9.0000 mL | INTRAVENOUS | Status: DC | PRN

## 2018-03-16 MED ORDER — SENNOSIDES-DOCUSATE SODIUM 8.6-50 MG PO TABS
1.0000 | ORAL_TABLET | Freq: Two times a day (BID) | ORAL | Status: DC
  Administered 2018-03-17 – 2018-03-18 (×3): 1 via ORAL
  Filled 2018-03-16 (×4): qty 1

## 2018-03-16 MED ORDER — POLYETHYLENE GLYCOL 3350 17 G PO PACK: 17.0000 g | PACK | Freq: Every day | ORAL | Status: DC | PRN

## 2018-03-16 MED ORDER — KETOROLAC TROMETHAMINE 15 MG/ML IJ SOLN
15.0000 mg | Freq: Four times a day (QID) | INTRAMUSCULAR | Status: DC
  Administered 2018-03-16 – 2018-03-18 (×7): 15 mg via INTRAVENOUS
  Filled 2018-03-16 (×7): qty 1

## 2018-03-16 MED ORDER — CYCLOBENZAPRINE HCL 10 MG PO TABS
10.0000 mg | ORAL_TABLET | Freq: Three times a day (TID) | ORAL | Status: DC | PRN
  Administered 2018-03-18: 10 mg via ORAL
  Filled 2018-03-16 (×2): qty 1

## 2018-03-16 MED ORDER — ONDANSETRON HCL 4 MG/2ML IJ SOLN
4.0000 mg | Freq: Four times a day (QID) | INTRAMUSCULAR | Status: DC | PRN
  Administered 2018-03-16 – 2018-03-17 (×2): 4 mg via INTRAVENOUS
  Filled 2018-03-16 (×2): qty 2

## 2018-03-16 MED ORDER — FOLIC ACID 1 MG PO TABS
1.0000 mg | ORAL_TABLET | Freq: Every day | ORAL | Status: DC
  Administered 2018-03-16 – 2018-03-18 (×3): 1 mg via ORAL
  Filled 2018-03-16 (×3): qty 1

## 2018-03-16 MED ORDER — HYDROXYUREA 500 MG PO CAPS
2000.0000 mg | ORAL_CAPSULE | Freq: Every day | ORAL | Status: DC
  Filled 2018-03-16 (×2): qty 4

## 2018-03-16 MED ORDER — ACETAMINOPHEN 500 MG PO TABS
1000.0000 mg | ORAL_TABLET | Freq: Once | ORAL | Status: AC
  Administered 2018-03-16: 1000 mg via ORAL
  Filled 2018-03-16: qty 2

## 2018-03-16 MED ORDER — NALOXONE HCL 0.4 MG/ML IJ SOLN: 0.4000 mg | INTRAMUSCULAR | Status: DC | PRN

## 2018-03-16 MED ORDER — HYDROMORPHONE 1 MG/ML IV SOLN
INTRAVENOUS | Status: DC
  Administered 2018-03-16: 1.4 mg via INTRAVENOUS
  Administered 2018-03-16: 30 mg via INTRAVENOUS
  Administered 2018-03-16: 15.4 mg via INTRAVENOUS
  Administered 2018-03-17: 7.2 mg via INTRAVENOUS
  Administered 2018-03-17: 30 mg via INTRAVENOUS
  Administered 2018-03-17: 1.7 mg via INTRAVENOUS
  Administered 2018-03-17: 9 mg via INTRAVENOUS
  Filled 2018-03-16 (×2): qty 30

## 2018-03-16 MED ORDER — KETOROLAC TROMETHAMINE 30 MG/ML IJ SOLN
30.0000 mg | Freq: Once | INTRAMUSCULAR | Status: AC
  Administered 2018-03-16: 30 mg via INTRAVENOUS
  Filled 2018-03-16: qty 1

## 2018-03-16 MED ORDER — DIPHENHYDRAMINE HCL 25 MG PO CAPS: 25.0000 mg | ORAL_CAPSULE | ORAL | Status: DC | PRN

## 2018-03-16 MED ORDER — SODIUM CHLORIDE 0.45 % IV SOLN
INTRAVENOUS | Status: DC
  Administered 2018-03-16 – 2018-03-17 (×3): via INTRAVENOUS

## 2018-03-16 MED ORDER — ENOXAPARIN SODIUM 40 MG/0.4ML ~~LOC~~ SOLN
40.0000 mg | SUBCUTANEOUS | Status: DC
  Filled 2018-03-16: qty 0.4

## 2018-03-16 NOTE — Progress Notes (Signed)
Patient admitted to the day infusion hospital from primary provider appointment. Patient initially reported pain in bilateral arms and legs rated 7/10. For pain management, patient placed on Dilaudid PCA, given 1000 mg Tylenol, 30 mg Toradol and hydrated with IV fluids.  Patient's pain remained elevated at 6/10. Provider notified and orders written to admit patient to inpatient. Report called to [address]. Vital signs wnl. Patient alert, oriented and stable at transfer.

## 2018-03-16 NOTE — Progress Notes (Signed)
[facility]   Sickle Cell Anemia Follow Up  Subjective:  Patient ID: Stephanie Burch, female    DOB: [birth year]  Age: 38 y.o. MRN: [id number]  CC:  Chief Complaint  Patient presents with  . Follow-up    sickle cell   HPI Stephanie Burch is a 38 year old female who presents for Sick Cell Anemia Follow Up.  Past Medical History:  Diagnosis Date  . Sickle cell anemia (HCC)    Current Status: Since her last office visit, she her pain is increased. She states that she has pain in her arms and legs. She rates her pain today at 7/10. She has not has a hospital visit for Sickle Cell Crisis since 02/19/2018 where she was treated and discharged the same day. She is currently taking all medications as prescribed and staying well hydrated. She reports occasional dizziness and headaches.   She denies fevers, chills, fatigue, recent infections, weight loss, and night sweats. She has not had any headaches, visual changes, dizziness, and falls. No chest pain, heart palpitations, cough and shortness of breath reported. No reports of GI problems such as nausea, vomiting, diarrhea, and constipation. She has no reports of blood in stools, dysuria and hematuria. No depression or anxiety reported.   History reviewed. No pertinent surgical history.  Family History  Problem Relation Age of Onset  . Hypertension Mother   . Sickle cell trait Mother   . Diabetes Father   . Sickle cell trait Father     Social History   Socioeconomic History  . Marital status: Single    Spouse name: Not on file  . Number of children: Not on file  . Years of education: Not on file  . Highest education level: Not on file  Occupational History  . Not on file  Social Needs  . Financial resource strain: Not on file  . Food insecurity:    Worry: Not on file    Inability: Not on file  . Transportation needs:    Medical: Not on file    Non-medical: Not on file  Tobacco Use    . Smoking status: Never Smoker  . Smokeless tobacco: Never Used  Substance and Sexual Activity  . Alcohol use: Never    Frequency: Never  . Drug use: Never  . Sexual activity: Yes    Birth control/protection: None  Lifestyle  . Physical activity:    Days per week: Not on file    Minutes per session: Not on file  . Stress: Not on file  Relationships  . Social connections:    Talks on phone: Not on file    Gets together: Not on file    Attends religious service: Not on file    Active member of club or organization: Not on file    Attends meetings of clubs or organizations: Not on file    Relationship status: Not on file  . Intimate partner violence:    Fear of current or ex partner: Not on file    Emotionally abused: Not on file    Physically abused: Not on file    Forced sexual activity: Not on file  Other Topics Concern  . Not on file  Social History Narrative  . Not on file    Outpatient Medications Prior to Visit  Medication Sig Dispense Refill  . cyclobenzaprine (FLEXERIL) 10 MG tablet Take 1 tablet (10 mg total) by mouth 3 (three) times daily as needed for  muscle spasms. 30 tablet 2  . folic acid (FOLVITE) 1 MG tablet Take 1 tablet (1 mg total) by mouth daily. 30 tablet 11  . hydroxyurea (HYDREA) 500 MG capsule Take 4 capsules (2,000 mg total) by mouth daily. May take with food to minimize GI side effects. 120 capsule 1  . ibuprofen (ADVIL,MOTRIN) 800 MG tablet Take 1 tablet (800 mg total) by mouth every 8 (eight) hours as needed for headache, mild pain, moderate pain or cramping. 30 tablet 3  . ondansetron (ZOFRAN) 4 MG tablet Take 1 tablet (4 mg total) by mouth every 8 (eight) hours as needed for nausea or vomiting. 20 tablet 0  . ondansetron (ZOFRAN) 4 MG/2ML SOLN injection Inject 2 mLs (4 mg total) into the vein every 6 (six) hours as needed for nausea or vomiting. 20 mL 2  . Oxycodone HCl 10 MG TABS Take 2 tablets (20 mg total) by mouth every 4 (four) hours as  needed for up to 15 days. 180 tablet 0  . Vitamin D, Ergocalciferol, (DRISDOL) 50000 units CAPS capsule Take 1 capsule (50,000 Units total) by mouth every 7 (seven) days. 5 capsule 2   No facility-administered medications prior to visit.     No Known Allergies  ROS Review of Systems  Constitutional: Negative.   HENT: Negative.   Eyes: Negative.   Respiratory: Negative.   Cardiovascular: Negative.   Gastrointestinal: Positive for abdominal distention.  Endocrine: Negative.   Genitourinary: Negative.   Musculoskeletal: Positive for arthralgias (Legs and Arms).  Skin: Negative.   Allergic/Immunologic: Negative.   Neurological: Positive for dizziness and headaches.  Hematological: Negative.   Psychiatric/Behavioral: Negative.       Objective:    Physical Exam  Constitutional: She is oriented to person, place, and time. She appears well-developed and well-nourished.  HENT:  Head: Normocephalic and atraumatic.  Eyes: Conjunctivae are normal.  Neck: Normal range of motion. Neck supple.  Cardiovascular: Normal rate and regular rhythm.  Pulmonary/Chest: Effort normal and breath sounds normal.  Abdominal: Soft. Bowel sounds are normal.  Musculoskeletal: Normal range of motion.  Neurological: She is alert and oriented to person, place, and time. She has normal reflexes.  Skin: Skin is warm and dry.  Psychiatric: She has a normal mood and affect. Her behavior is normal. Judgment and thought content normal.  Nursing note and vitals reviewed.   BP (!) 142/90 (BP Location: Left Arm, Patient Position: Sitting, Cuff Size: Large)   Pulse (!) 110   Temp 98.4 F (36.9 C) (Oral)   Ht 5\' 5"  (1.651 m)   Wt 279 lb (126.6 kg)   LMP 03/02/2018   SpO2 100%   BMI 46.43 kg/m  Wt Readings from Last 3 Encounters:  03/16/18 279 lb (126.6 kg)  02/16/18 276 lb (125.2 kg)  02/09/18 276 lb (125.2 kg)    Health Maintenance Due  Topic Date Due  . HIV Screening  01/12/1998  . PAP  SMEAR-Modifier  01/13/2004    There are no preventive care reminders to display for this patient.  Lab Results  Component Value Date   TSH 0.862 09/11/2017   Lab Results  Component Value Date   WBC 6.4 02/19/2018   HGB 11.4 (L) 02/19/2018   HCT 34.6 (L) 02/19/2018   MCV 70.9 (L) 02/19/2018   PLT 209 02/19/2018   Lab Results  Component Value Date   NA 140 02/19/2018   K 4.9 02/19/2018   CO2 24 02/19/2018   GLUCOSE 85 02/19/2018   BUN  9 02/19/2018   CREATININE 0.68 02/19/2018   BILITOT 1.0 02/19/2018   ALKPHOS 63 02/19/2018   AST 28 02/19/2018   ALT 14 02/19/2018   PROT 7.6 02/19/2018   ALBUMIN 3.9 02/19/2018   CALCIUM 9.0 02/19/2018   ANIONGAP 9 02/19/2018   Lab Results  Component Value Date   CHOL 92 (L) 09/11/2017   Lab Results  Component Value Date   HDL 45 09/11/2017   Lab Results  Component Value Date   LDLCALC 36 09/11/2017   Lab Results  Component Value Date   TRIG 53 09/11/2017   Lab Results  Component Value Date   CHOLHDL 2.0 09/11/2017   Lab Results  Component Value Date   HGBA1C 4.1 09/11/2017   Assessment & Plan:   1. Sickle cell pain crisis (HCC) She is experiencing increased pain in her lower extremities today. [facility] for [facility] and possible IV pain meds for quicker pain management. She will continue to take pain medications as prescribed; will continue to avoid extreme heat and cold; will continue to eat a healthy diet and drink at least 64 ounces of water daily; continue stool softener as needed; will avoid colds and flu; will continue to get plenty of sleep and rest; will continue to avoid high stressful situations and remain infection free; will continue Folic Acid 1 mg daily to avoid sickle cell crisis.   2. Chronic pain syndrome  3. Chronic, continuous use of opioids  4. Follow up She will follow up in 2 weeks.  - POCT urinalysis dipstick   No orders of the defined types were placed  in this encounter.   [name],  MSN, FNP-C [facility] [address] [place], [place] [address] [phone]   Problem List Items Addressed This Visit      Other   Sickle cell pain crisis (HCC) - Primary    Other Visit Diagnoses    Chronic pain syndrome       Chronic, continuous use of opioids       Follow up       Relevant Orders   POCT urinalysis dipstick (Completed)      No orders of the defined types were placed in this encounter.   Follow-up: No follow-ups on file.    Kallie Locks, FNP

## 2018-03-16 NOTE — H&P (Signed)
H&P  Patient Demographics:  Stephanie Burch, is a 38 y.o. female  MRN: [id number]   DOB - 11-11-1980  Admit Date - 03/16/2018  Outpatient Primary MD for the patient is Kallie LocksStroud, [name], FNP     HPI:   Stephanie Burch  is a 38 y.o. female with a medical history significant for sickle cell anemia and chronic pain syndrome presented complaining of lower extremity pain that is consistent with typical sickle cell pain crisis.  Patient states that pain is been increasing over the past several days and is been unrelieved by prescribed oxycodone.  Patient has been taking all medications consistently without sustained relief.  Pain intensity 9/10 characterized as constant and throbbing.  Patient states that lower extremity pain is worsened with weightbearing. Patient currently denies headache, chest pain, dizziness, paresthesias, abdominal pain, nausea, vomiting, or diarrhea.  Patient was treated and evaluated in the sickle cell day infusion center.  Patient's hemoglobin is 11.2, which is consistent with baseline.  All other laboratory values within normal range.  Patient afebrile and maintaining oxygen saturation above 90% on RA.  Treatment included IV fluid, IV Toradol, and IV Dilaudid PCA.  Pain persisted despite treatment.  Patient was admitted to [facility] for sickle cell pain crisis.   Review of systems:  In addition to the HPI above, patient reports Review of Systems  Constitutional: Negative for chills and fever.  Eyes: Negative.   Respiratory: Negative for cough, hemoptysis and shortness of breath.   Cardiovascular: Negative.   Gastrointestinal: Negative.  Negative for constipation and diarrhea.  Musculoskeletal: Positive for joint pain.  Neurological: Negative.  Negative for tingling and tremors.  Endo/Heme/Allergies: Negative.   Psychiatric/Behavioral: Negative.     A full 10 point Review of Systems was done, except as stated above, all other Review of Systems were negative.  With Past  History of the following :   Past Medical History:  Diagnosis Date  . Sickle cell anemia (HCC)       History reviewed. No pertinent surgical history.   Social History:   Social History   Tobacco Use  . Smoking status: Never Smoker  . Smokeless tobacco: Never Used  Substance Use Topics  . Alcohol use: Never    Frequency: Never     Lives - At home   Family History :   Family History  Problem Relation Age of Onset  . Hypertension Mother   . Sickle cell trait Mother   . Diabetes Father   . Sickle cell trait Father      Home Medications:   Prior to Admission medications   Medication Sig Start Date End Date Taking? Authorizing Provider  cyclobenzaprine (FLEXERIL) 10 MG tablet Take 1 tablet (10 mg total) by mouth 3 (three) times daily as needed for muscle spasms. 02/16/18  Yes Kallie LocksStroud, [name], FNP  folic acid (FOLVITE) 1 MG tablet Take 1 tablet (1 mg total) by mouth daily. 10/07/17  Yes Kallie LocksStroud, [name], FNP  ibuprofen (ADVIL,MOTRIN) 800 MG tablet Take 1 tablet (800 mg total) by mouth every 8 (eight) hours as needed for headache, mild pain, moderate pain or cramping. 01/13/18  Yes Kallie LocksStroud, [name], FNP  ondansetron (ZOFRAN) 4 MG tablet Take 1 tablet (4 mg total) by mouth every 8 (eight) hours as needed for nausea or vomiting. 02/16/18  Yes Kallie LocksStroud, [name], FNP  Oxycodone HCl 10 MG TABS Take 2 tablets (20 mg total) by mouth every 4 (four) hours as needed for up to 15 days. 03/05/18  03/20/18 Yes Kallie LocksStroud, [name], FNP  Vitamin D, Ergocalciferol, (DRISDOL) 50000 units CAPS capsule Take 1 capsule (50,000 Units total) by mouth every 7 (seven) days. 12/02/17  Yes Kallie LocksStroud, [name], FNP  hydroxyurea (HYDREA) 500 MG capsule Take 4 capsules (2,000 mg total) by mouth daily. May take with food to minimize GI side effects. 02/16/18   Kallie LocksStroud, [name], FNP  ondansetron (ZOFRAN) 4 MG/2ML SOLN injection Inject 2 mLs (4 mg total) into the vein every 6 (six) hours as needed for nausea or  vomiting. Patient not taking: Reported on 03/16/2018 02/16/18   Kallie LocksStroud, [name], FNP     Allergies:   No Known Allergies   Physical Exam:   Vitals:   Vitals:   03/16/18 1954 03/16/18 2000  BP:  131/82  Pulse:  (!) 105  Resp: 16 16  Temp:  98 F (36.7 C)  SpO2: 100% 95%    Physical Exam: Constitutional: Patient appears well-developed and well-nourished. Not in obvious distress. HENT: Normocephalic, atraumatic, External right and left ear normal. Oropharynx is clear and moist.  Eyes: Conjunctivae and EOM are normal. PERRLA, no scleral icterus. Neck: Normal ROM. Neck supple. No JVD. No tracheal deviation. No thyromegaly. CVS: RRR, S1/S2 +, no murmurs, no gallops, no carotid bruit.  Pulmonary: Effort and breath sounds normal, no stridor, rhonchi, wheezes, rales.  Abdominal: Soft. BS +, no distension, tenderness, rebound or guarding.  Musculoskeletal: Normal range of motion. No edema and no tenderness.  Lymphadenopathy: No lymphadenopathy noted, cervical, inguinal or axillary Neuro: Alert. Normal reflexes, muscle tone coordination. No cranial nerve deficit. Skin: Skin is warm and dry. No rash noted. Not diaphoretic. No erythema. No pallor. Psychiatric: Normal mood and affect. Behavior, judgment, thought content normal.   Data Review:   CBC Recent Labs  Lab 03/16/18 1108  WBC 7.0  HGB 11.2*  HCT 33.2*  PLT 211  MCV 71.4*  MCH 24.1*  MCHC 33.7  RDW 14.3  LYMPHSABS 1.8  MONOABS 0.5  EOSABS 0.1  BASOSABS 0.0   ------------------------------------------------------------------------------------------------------------------  Chemistries  Recent Labs  Lab 03/16/18 1108  NA 138  K 4.3  CL 104  CO2 28  GLUCOSE 98  BUN 6  CREATININE 0.56  CALCIUM 9.0  AST 16  ALT 20  ALKPHOS 62  BILITOT 0.8   ------------------------------------------------------------------------------------------------------------------ estimated creatinine clearance is 131.1 mL/min (by  C-G formula based on SCr of 0.56 mg/dL). ------------------------------------------------------------------------------------------------------------------ No results for input(s): TSH, T4TOTAL, T3FREE, THYROIDAB in the last 72 hours.  Invalid input(s): FREET3  Coagulation profile No results for input(s): INR, PROTIME in the last 168 hours. ------------------------------------------------------------------------------------------------------------------- No results for input(s): DDIMER in the last 72 hours. -------------------------------------------------------------------------------------------------------------------  Cardiac Enzymes No results for input(s): CKMB, TROPONINI, MYOGLOBIN in the last 168 hours.  Invalid input(s): CK ------------------------------------------------------------------------------------------------------------------ No results found for: BNP  ---------------------------------------------------------------------------------------------------------------  Urinalysis    Component Value Date/Time   COLORURINE YELLOW 02/09/2018 0933   APPEARANCEUR CLEAR 02/09/2018 0933   LABSPEC 1.011 02/09/2018 0933   PHURINE 5.0 02/09/2018 0933   GLUCOSEU NEGATIVE 02/09/2018 0933   HGBUR NEGATIVE 02/09/2018 0933   BILIRUBINUR negative 03/16/2018 0859   KETONESUR negative 03/16/2018 0859   KETONESUR NEGATIVE 02/09/2018 0933   PROTEINUR negative 03/16/2018 0859   PROTEINUR NEGATIVE 02/09/2018 0933   UROBILINOGEN 1.0 03/16/2018 0859   NITRITE Negative 03/16/2018 0859   NITRITE NEGATIVE 02/09/2018 0933   LEUKOCYTESUR Negative 03/16/2018 0859    ----------------------------------------------------------------------------------------------------------------   Imaging Results:    No results found.    Assessment &  Plan:  Active Problems:   Sickle cell pain crisis (HCC)   Hb Sickle Cell Disease with crisis: Pain persists despite treatment in the infusion  center. Admit, continue weight-based Dilaudid PCA with settings of 0.6 mg, 10-minute lockout, and 4 mg/h. Continue IVF, 0.45% saline at 100 mL/h IV Toradol 15 mg every 6 hours Monitor vital signs closely Maintain oxygen saturation above 90% Patient will be reevaluated in the context of functioning as pain intensity returns to baseline. Continue folic acid 1 mg daily Chronic pain syndrome: Hold oxycodone, use PCA Dilaudid as substitute  Anemia of chronic disease: Hemoglobin 11.4, which is consistent with baseline.  No blood transfusion indicated at this time.  Continue to monitor closely.   DVT Prophylaxis: Subcut Lovenox   AM Labs Ordered, also please review Full Orders  Family Communication: Admission, patient's condition and plan of care including tests being ordered have been discussed with the patient who indicate understanding and agree with the plan and Code Status.  Code Status: Full Code  Consults called: None    Admission status: Inpatient    Time spent in minutes : 40 minutes  Nolon Nations  APRN, MSN, FNP-C [facility] [address] [place], [place] [address] [phone]  03/16/2018 at 10:42 PM

## 2018-03-16 NOTE — H&P (Signed)
[facility] History and Physical   Date: 03/16/2018  Patient name: Stephanie Burch Medical record number: [id number] Date of birth: 1980/11/26 Age: 38 y.o. Gender: female PCP: Kallie Locks, FNP  Attending physician: Quentin Angst, MD  Chief Complaint: Bilateral lower extremity pain  History of Present Illness: Stephanie Burch, a 38 year old female with a medical history significant for sickle cell anemia, chronic pain syndrome, and morbid obesity presents complaining of bilateral lower extremity pain.  Patient was seen in primary care this a.m. for medication management.  Patient states that pain is been increasing over the past 3 to 4 days.  She has been taking oxycodone consistently without sustained relief.  Patient is also increased fluid intake to avert current pain crisis without success. Patient denies headache, chest pain, abdominal pain, dysuria, nausea, vomiting, or diarrhea.                                                                 Meds: Medications Prior to Admission  Medication Sig Dispense Refill Last Dose  . cyclobenzaprine (FLEXERIL) 10 MG tablet Take 1 tablet (10 mg total) by mouth 3 (three) times daily as needed for muscle spasms. 30 tablet 2 Taking  . folic acid (FOLVITE) 1 MG tablet Take 1 tablet (1 mg total) by mouth daily. 30 tablet 11 Taking  . hydroxyurea (HYDREA) 500 MG capsule Take 4 capsules (2,000 mg total) by mouth daily. May take with food to minimize GI side effects. 120 capsule 1 Taking  . ibuprofen (ADVIL,MOTRIN) 800 MG tablet Take 1 tablet (800 mg total) by mouth every 8 (eight) hours as needed for headache, mild pain, moderate pain or cramping. 30 tablet 3 Taking  . ondansetron (ZOFRAN) 4 MG tablet Take 1 tablet (4 mg total) by mouth every 8 (eight) hours as needed for nausea or vomiting. 20 tablet 0 Taking  . ondansetron (ZOFRAN) 4 MG/2ML SOLN injection Inject 2 mLs (4 mg total) into the vein every 6 (six) hours as needed for  nausea or vomiting. 20 mL 2 Taking  . Oxycodone HCl 10 MG TABS Take 2 tablets (20 mg total) by mouth every 4 (four) hours as needed for up to 15 days. 180 tablet 0 Taking  . Vitamin D, Ergocalciferol, (DRISDOL) 50000 units CAPS capsule Take 1 capsule (50,000 Units total) by mouth every 7 (seven) days. 5 capsule 2 Taking    Allergies: Patient has no known allergies. Past Medical History:  Diagnosis Date  . Sickle cell anemia (HCC)    No past surgical history on file. Family History  Problem Relation Age of Onset  . Hypertension Mother   . Sickle cell trait Mother   . Diabetes Father   . Sickle cell trait Father    Social History   Socioeconomic History  . Marital status: Single    Spouse name: Not on file  . Number of children: Not on file  . Years of education: Not on file  . Highest education level: Not on file  Occupational History  . Not on file  Social Needs  . Financial resource strain: Not on file  . Food insecurity:    Worry: Not on file    Inability: Not on file  . Transportation needs:    Medical:  Not on file    Non-medical: Not on file  Tobacco Use  . Smoking status: Never Smoker  . Smokeless tobacco: Never Used  Substance and Sexual Activity  . Alcohol use: Never    Frequency: Never  . Drug use: Never  . Sexual activity: Yes    Birth control/protection: None  Lifestyle  . Physical activity:    Days per week: Not on file    Minutes per session: Not on file  . Stress: Not on file  Relationships  . Social connections:    Talks on phone: Not on file    Gets together: Not on file    Attends religious service: Not on file    Active member of club or organization: Not on file    Attends meetings of clubs or organizations: Not on file    Relationship status: Not on file  . Intimate partner violence:    Fear of current or ex partner: Not on file    Emotionally abused: Not on file    Physically abused: Not on file    Forced sexual activity: Not on file   Other Topics Concern  . Not on file  Social History Narrative  . Not on file   Review of Systems  Constitutional: Negative for chills and fever.  HENT: Negative.   Eyes: Negative.   Respiratory: Negative.   Cardiovascular: Negative for chest pain and palpitations.  Genitourinary: Negative.   Musculoskeletal: Positive for joint pain.  Skin: Negative.   Neurological: Negative.   Endo/Heme/Allergies: Negative.   Psychiatric/Behavioral: Negative.      Physical Exam: Last menstrual period 03/02/2018. Physical Exam Constitutional:      Appearance: Normal appearance. She is obese.  HENT:     Nose: Nose normal.     Mouth/Throat:     Mouth: Mucous membranes are moist.  Eyes:     Pupils: Pupils are equal, round, and reactive to light.  Neck:     Musculoskeletal: Normal range of motion.  Cardiovascular:     Rate and Rhythm: Normal rate and regular rhythm.     Pulses: Normal pulses.     Heart sounds: Normal heart sounds.  Pulmonary:     Effort: Pulmonary effort is normal.     Breath sounds: Normal breath sounds.  Abdominal:     General: Bowel sounds are normal.     Palpations: Abdomen is soft.  Musculoskeletal: Normal range of motion.  Skin:    General: Skin is warm and dry.  Neurological:     General: No focal deficit present.     Mental Status: She is alert.  Psychiatric:        Mood and Affect: Mood normal.        Behavior: Behavior normal.        Thought Content: Thought content normal.        Judgment: Judgment normal.      Lab results: Results for orders placed or performed in visit on 03/16/18 (from the past 24 hour(s))  POCT urinalysis dipstick     Status: Normal   Collection Time: 03/16/18  8:59 AM  Result Value Ref Range   Color, UA yellow yellow   Clarity, UA clear clear   Glucose, UA negative negative mg/dL   Bilirubin, UA negative negative   Ketones, POC UA negative negative mg/dL   Spec Grav, UA [id number] - 1.025   Blood, UA negative negative    pH, UA 7.0 5.0 - 8.0   Protein Ur,  POC negative negative mg/dL   Urobilinogen, UA 1.0 0.2 or 1.0 E.U./dL   Nitrite, UA Negative Negative   Leukocytes, UA Negative Negative    Imaging results:  No results found.   Assessment & Plan:  Patient will be admitted to the day infusion center for extended observation  Start IV 0.45 for cellular rehydration at 100/hr  Start Toradol 15 mg IV every 6 hours for inflammation.  Start Dilaudid PCA High Concentration per weight based protocol.   Patient will be re-evaluated for pain intensity in the context of function and relationship to baseline as care progresses.  If no significant pain relief, will transfer patient to inpatient services for a higher level of care.   Review CMP, reticulocytes, and CBC with differential      Nolon Nations  APRN, MSN, FNP-C [facility] [address] [place], [place] [address] [phone]  03/16/2018, 10:18 AM

## 2018-03-17 ENCOUNTER — Other Ambulatory Visit: Payer: Self-pay

## 2018-03-17 LAB — HIV ANTIBODY (ROUTINE TESTING W REFLEX): HIV Screen 4th Generation wRfx: NONREACTIVE

## 2018-03-17 MED ORDER — GABAPENTIN 300 MG PO CAPS
300.0000 mg | ORAL_CAPSULE | Freq: Three times a day (TID) | ORAL | Status: DC
  Administered 2018-03-17 – 2018-03-18 (×4): 300 mg via ORAL
  Filled 2018-03-17 (×4): qty 1

## 2018-03-17 MED ORDER — HYDROMORPHONE 1 MG/ML IV SOLN
INTRAVENOUS | Status: DC
  Administered 2018-03-17: 0.3 mg via INTRAVENOUS
  Administered 2018-03-17: 3 mg via INTRAVENOUS
  Administered 2018-03-17: 0.6 mg via INTRAVENOUS
  Administered 2018-03-18: 2.1 mg via INTRAVENOUS
  Administered 2018-03-18: 1.8 mg via INTRAVENOUS

## 2018-03-17 MED ORDER — OXYCODONE HCL 5 MG PO TABS
20.0000 mg | ORAL_TABLET | ORAL | Status: DC | PRN
  Administered 2018-03-17 – 2018-03-18 (×3): 20 mg via ORAL
  Filled 2018-03-17 (×4): qty 4

## 2018-03-17 NOTE — Progress Notes (Signed)
Subjective: Stephanie Burch, a 38 year old female with a medical history significant for sickle cell anemia and chronic pain syndrome was admitted and sickle cell pain crisis.  Patient states that pain is improved significantly overnight.  Pain intensity is 4/10 primarily to lower extremities.  Patient is able to ambulate without assistance.  Patient states that pain is not improved to the point where she can manage at home.  Patient denies headache, chest pain, nausea, vomiting, or diarrhea.  Objective:  Vital signs in last 24 hours:  Vitals:   03/17/18 0944 03/17/18 1252 03/17/18 1253 03/17/18 1255  BP: 111/79   126/87  Pulse: (!) 104   (!) 102  Resp: 17 17 16 17   Temp: 98.2 F (36.8 C)   98.5 F (36.9 C)  TempSrc: Oral   Oral  SpO2: 97% 100% 100% 96%  Weight:      Height:        Intake/Output from previous day:   Intake/Output Summary (Last 24 hours) at 03/17/2018 1552 Last data filed at 03/17/2018 1300 Gross per 24 hour  Intake 3700.79 ml  Output 325 ml  Net 3375.79 ml    Physical Exam: General: Alert, awake, oriented x3, in no acute distress.  HEENT: [place]/AT PEERL, EOMI Neck: Trachea midline,  no masses, no thyromegal,y no JVD, no carotid bruit OROPHARYNX:  Moist, No exudate/ erythema/lesions.  Heart: Regular rate and rhythm, without murmurs, rubs, gallops, PMI non-displaced, no heaves or thrills on palpation.  Lungs: Clear to auscultation, no wheezing or rhonchi noted. No increased vocal fremitus resonant to percussion  Abdomen: Soft, nontender, nondistended, positive bowel sounds, no masses no hepatosplenomegaly noted..  Neuro: No focal neurological deficits noted cranial nerves II through XII grossly intact. DTRs 2+ bilaterally upper and lower extremities. Strength 5 out of 5 in bilateral upper and lower extremities. Musculoskeletal: No warm swelling or erythema around joints, no spinal tenderness noted. Psychiatric: Patient alert and oriented x3, good insight and  cognition, good recent to remote recall. Lymph node survey: No cervical axillary or inguinal lymphadenopathy noted.  Lab Results:  Basic Metabolic Panel:    Component Value Date/Time   NA 138 03/16/2018 1108   NA 142 09/11/2017 1431   K 4.3 03/16/2018 1108   CL 104 03/16/2018 1108   CO2 28 03/16/2018 1108   BUN 6 03/16/2018 1108   BUN 8 09/11/2017 1431   CREATININE 0.56 03/16/2018 1108   GLUCOSE 98 03/16/2018 1108   CALCIUM 9.0 03/16/2018 1108   CBC:    Component Value Date/Time   WBC 7.0 03/16/2018 1108   HGB 11.2 (L) 03/16/2018 1108   HGB 13.0 09/11/2017 1431   HCT 33.2 (L) 03/16/2018 1108   HCT 38.4 09/11/2017 1431   PLT 211 03/16/2018 1108   PLT 258 09/11/2017 1431   MCV 71.4 (L) 03/16/2018 1108   MCV 75 (L) 09/11/2017 1431   NEUTROABS 4.5 03/16/2018 1108   NEUTROABS 3.9 09/11/2017 1431   LYMPHSABS 1.8 03/16/2018 1108   LYMPHSABS 2.2 09/11/2017 1431   MONOABS 0.5 03/16/2018 1108   EOSABS 0.1 03/16/2018 1108   EOSABS 0.1 09/11/2017 1431   BASOSABS 0.0 03/16/2018 1108   BASOSABS 0.0 09/11/2017 1431    No results found for this or any previous visit (from the past 240 hour(s)).  Studies/Results: No results found.  Medications: Scheduled Meds: . enoxaparin (LOVENOX) injection  40 mg Subcutaneous Q24H  . folic acid  1 mg Oral Daily  . gabapentin  300 mg Oral TID  . HYDROmorphone  Intravenous Q4H  . hydroxyurea  2,000 mg Oral Daily  . ketorolac  15 mg Intravenous Q6H  . senna-docusate  1 tablet Oral BID   Continuous Infusions: . sodium chloride 50 mL/hr at 03/17/18 1253  . diphenhydrAMINE     PRN Meds:.cyclobenzaprine, diphenhydrAMINE **OR** diphenhydrAMINE, naloxone **AND** sodium chloride flush, ondansetron (ZOFRAN) IV, oxyCODONE, polyethylene glycol  Assessment/Plan: Active Problems:   Sickle cell pain crisis (HCC)   Morbidly obese (HCC)   Chronic pain syndrome Sickle cell anemia with pain crisis: Weaning weight-based Dilaudid PCA Decrease IV  fluids to 50 mL/h Continue Toradol 15 mg IV x1. Oxycodone 15 mg every 4 hours as needed Reevaluate pain consistently in the context of functioning Maintain oxygen saturation above 90%  Chronic pain syndrome: Continue home medications Gabapentin 300 mg 3 times per day   Code Status: Full Code Family Communication: N/A Disposition Plan: Not yet ready for discharge Nolon Nations  APRN, MSN, FNP-C [facility] [address] [place], [place] [address] [phone]  If 5PM-7AM, please contact night-coverage.  03/17/2018, 3:52 PM  LOS: 1 day

## 2018-03-17 NOTE — Telephone Encounter (Signed)
Patient needs a refill on Oxycodone 10mg  and she would also like to try the new medication that you discuss with her.

## 2018-03-18 MED ORDER — OXYCODONE HCL ER 10 MG PO T12A: 10.0000 mg | EXTENDED_RELEASE_TABLET | Freq: Two times a day (BID) | ORAL | 0 refills | Status: DC

## 2018-03-18 MED ORDER — OXYCODONE HCL 5 MG PO TABS
20.0000 mg | ORAL_TABLET | ORAL | Status: DC
  Administered 2018-03-18 (×2): 20 mg via ORAL
  Filled 2018-03-18 (×2): qty 4

## 2018-03-18 MED ORDER — OXYCODONE HCL ER 10 MG PO T12A
10.0000 mg | EXTENDED_RELEASE_TABLET | Freq: Two times a day (BID) | ORAL | Status: DC
  Filled 2018-03-18: qty 1

## 2018-03-18 NOTE — Telephone Encounter (Signed)
Per Debby Bud she will not fill medication at this time and Dorene Grebe will address message first thing in the morning. Medication is not due until 2/15 and we will make authorization is done if needed before weekend.

## 2018-03-18 NOTE — Telephone Encounter (Signed)
Patient called again regarding her medication been sent to pharmacy. Patient was advised of the medication policy again. Patient is aware that you are out of office and was asking if the other provider that's  here could fill the medication.

## 2018-03-18 NOTE — Discharge Instructions (Signed)
Sickle Cell Anemia, Adult °Sickle cell anemia is a condition where your red blood cells are shaped like sickles. Red blood cells carry oxygen through the body. Sickle-shaped cells do not live as long as normal red blood cells. They also clump together and block blood from flowing through the blood vessels. This prevents the body from getting enough oxygen. Sickle cell anemia causes organ damage and pain. It also increases the risk of infection. °Follow these instructions at home: °Medicines °· Take over-the-counter and prescription medicines only as told by your doctor. °· If you were prescribed an antibiotic medicine, take it as told by your doctor. Do not stop taking the antibiotic even if you start to feel better. °· If you develop a fever, do not take medicines to lower the fever right away. Tell your doctor about the fever. °Managing pain, stiffness, and swelling °· Try these methods to help with pain: °? Use a heating pad. °? Take a warm bath. °? Distract yourself, such as by watching TV. °Eating and drinking °· Drink enough fluid to keep your pee (urine) clear or pale yellow. Drink more in hot weather and during exercise. °· Limit or avoid alcohol. °· Eat a healthy diet. Eat plenty of fruits, vegetables, whole grains, and lean protein. °· Take vitamins and supplements as told by your doctor. °Traveling °· When traveling, keep these with you: °? Your medical information. °? The names of your doctors. °? Your medicines. °· If you need to take an airplane, talk to your doctor first. °Activity °· Rest often. °· Avoid exercises that make your heart beat much faster, such as jogging. °General instructions °· Do not use products that have nicotine or tobacco, such as cigarettes and e-cigarettes. If you need help quitting, ask your doctor. °· Consider wearing a medical alert bracelet. °· Avoid being in high places (high altitudes), such as mountains. °· Avoid very hot or cold temperatures. °· Avoid places where the  temperature changes a lot. °· Keep all follow-up visits as told by your doctor. This is important. °Contact a doctor if: °· A joint hurts. °· Your feet or hands hurt or swell. °· You feel tired (fatigued). °Get help right away if: °· You have symptoms of infection. These include: °? Fever. °? Chills. °? Being very tired. °? Irritability. °? Poor eating. °? Throwing up (vomiting). °· You feel dizzy or faint. °· You have new stomach pain, especially on the left side. °· You have a an erection (priapism) that lasts more than 4 hours. °· You have numbness in your arms or legs. °· You have a hard time moving your arms or legs. °· You have trouble talking. °· You have pain that does not go away when you take medicine. °· You are short of breath. °· You are breathing fast. °· You have a long-term cough. °· You have pain in your chest. °· You have a bad headache. °· You have a stiff neck. °· Your stomach looks bloated even though you did not eat much. °· Your skin is pale. °· You suddenly cannot see well. °Summary °· Sickle cell anemia is a condition where your red blood cells are shaped like sickles. °· Follow your doctor's advice on ways to manage pain, food to eat, activities to do, and steps to take for safe travel. °· Get medical help right away if you have any signs of infection, such as a fever. °This information is not intended to replace advice given to you by your   health care provider. Make sure you discuss any questions you have with your health care provider. °Document Released: 11/10/2012 Document Revised: 02/26/2016 Document Reviewed: 02/26/2016 °Elsevier Interactive Patient Education © 2019 [employer] ° °

## 2018-03-18 NOTE — Discharge Summary (Signed)
Physician Discharge Summary  Stephanie Burch OPF:[id number] DOB: 1980/11/16 DOA: 03/16/2018  PCP: Kallie Locks, FNP  Admit date: 03/16/2018  Discharge date: 03/18/2018  Discharge Diagnoses:  Active Problems:   Sickle cell pain crisis (HCC)   Morbidly obese (HCC)   Chronic pain syndrome   Discharge Condition: Stable  Disposition:  Follow-up Information    Kallie Locks, FNP Follow up in 1 week(s).   Specialty:  Family Medicine Contact information: [address] [place] [address] [phone]          Pt is discharged home in good condition and is to follow up with Kallie Locks, FNP this week to have labs evaluated. Stephanie Burch is instructed to increase activity slowly and balance with rest for the next few days, and use prescribed medication to complete treatment of pain  Diet: Regular Wt Readings from Last 3 Encounters:  03/17/18 123.1 kg  03/16/18 126.6 kg  02/16/18 125.2 kg    History of present illness:  Stephanie Burch, a 38 year old female with a medical history significant for sickle cell anemia, chronic pain syndrome, and morbid obesity presents complaining of lower extremity pain that is consistent with typical sickle cell pain crisis.  Patient states that pain is been increasing over the past several days and has been unrelieved by home opiate medication regimen.  Patient states that she has been taking most medications consistently without sustained relief.  She states that she was prescribed hydroxyurea several weeks ago but has not started due to potential side effects.  Patient says that pain intensity is 9/10 characterized as constant and throbbing.  She also says that lower extremity pain is worsened with weightbearing. Patient currently denies fever, chills, headache, chest pain, dizziness, paresthesias, abdominal pain, nausea, vomiting, or diarrhea. Patient was treated and evaluated in the sickle cell day infusion center for pain  management and extended observation.  Patient's hemoglobin is 11.2, which is consistent with baseline.  All other laboratory values unremarkable.  Patient afebrile maintain oxygen saturation above 90% on RA.  Treatment included IV fluids, IV Toradol, and IV Dilaudid PCA.  Pain persistent despite treatment.  Patient was admitted to MedSurg for sickle cell pain crisis.  Hospital Course:  Sickle cell pain crisis:  Patient was admitted for sickle cell pain crisis and managed appropriately with IVF, IV Dilaudid via PCA and IV Toradol, as well as other adjunct therapies per sickle cell pain management protocols.  Patient has a history of chronic pain syndrome and was transitioned to oxycodone 20 mg every 4 hours for severe breakthrough pain.  Also, a trial of OxyContin 10 mg every 12 hours was initiated.  Pain intensity improved significantly.  Patient prescribed OxyContin 10 mg #14.  Patient advised to follow-up in primary care for further medication management.  Patient and I discussed the importance of taking hydroxyurea consistently at length.  Patient states that she does not want to start medication, but is interested in starting   Patient was discharged home today in a hemodynamically stable condition.   Discharge Exam: Vitals:   03/18/18 0411 03/18/18 0455  BP: (!) 124/91   Pulse: (!) 103   Resp: 20 10  Temp: 98.3 F (36.8 C)   SpO2: 100% 99%   Vitals:   03/17/18 2358 03/18/18 0021 03/18/18 0411 03/18/18 0455  BP: 113/77  (!) 124/91   Pulse: 100  (!) 103   Resp: 18 16 20 10   Temp: 98.3 F (36.8 C)  98.3 F (36.8 C)  TempSrc: Oral  Oral   SpO2: 100% 98% 100% 99%  Weight:      Height:       Physical Exam Constitutional:      Appearance: Normal appearance. She is obese.  HENT:     Head: Normocephalic.     Nose: Nose normal.  Neck:     Musculoskeletal: Normal range of motion.  Cardiovascular:     Rate and Rhythm: Normal rate and regular rhythm.  Pulmonary:     Effort:  Pulmonary effort is normal.     Breath sounds: Normal breath sounds.  Abdominal:     General: Abdomen is flat.     Palpations: Abdomen is soft.  Musculoskeletal: Normal range of motion.  Skin:    General: Skin is warm and dry.  Neurological:     General: No focal deficit present.     Mental Status: She is alert. Mental status is at baseline.  Psychiatric:        Mood and Affect: Mood normal.        Behavior: Behavior normal.        Thought Content: Thought content normal.        Judgment: Judgment normal.     Discharge Instructions  Discharge Instructions    Discharge patient   Complete by:  As directed    Discharge disposition:  01-Home or Self Care   Discharge patient date:  03/18/2018     Allergies as of 03/18/2018   No Known Allergies     Medication List    TAKE these medications   cyclobenzaprine 10 MG tablet Commonly known as:  FLEXERIL Take 1 tablet (10 mg total) by mouth 3 (three) times daily as needed for muscle spasms.   folic acid 1 MG tablet Commonly known as:  FOLVITE Take 1 tablet (1 mg total) by mouth daily.   hydroxyurea 500 MG capsule Commonly known as:  HYDREA Take 4 capsules (2,000 mg total) by mouth daily. May take with food to minimize GI side effects.   ibuprofen 800 MG tablet Commonly known as:  ADVIL,MOTRIN Take 1 tablet (800 mg total) by mouth every 8 (eight) hours as needed for headache, mild pain, moderate pain or cramping.   ondansetron 4 MG/2ML Soln injection Commonly known as:  ZOFRAN Inject 2 mLs (4 mg total) into the vein every 6 (six) hours as needed for nausea or vomiting.   ondansetron 4 MG tablet Commonly known as:  ZOFRAN Take 1 tablet (4 mg total) by mouth every 8 (eight) hours as needed for nausea or vomiting.   Oxycodone HCl 10 MG Tabs Take 2 tablets (20 mg total) by mouth every 4 (four) hours as needed for up to 15 days. What changed:  Another medication with the same name was added. Make sure you understand how and  when to take each.   oxyCODONE 10 mg 12 hr tablet Commonly known as:  OXYCONTIN Take 1 tablet (10 mg total) by mouth every 12 (twelve) hours. What changed:  You were already taking a medication with the same name, and this prescription was added. Make sure you understand how and when to take each.   Vitamin D (Ergocalciferol) 1.25 MG (50000 UT) Caps capsule Commonly known as:  DRISDOL Take 1 capsule (50,000 Units total) by mouth every 7 (seven) days.       The results of significant diagnostics from this hospitalization (including imaging, microbiology, ancillary and laboratory) are listed below for reference.    Significant Diagnostic Studies:  No results found.  Microbiology: No results found for this or any previous visit (from the past 240 hour(s)).   Labs: Basic Metabolic Panel: Recent Labs  Lab 03/16/18 1108  NA 138  K 4.3  CL 104  CO2 28  GLUCOSE 98  BUN 6  CREATININE 0.56  CALCIUM 9.0   Liver Function Tests: Recent Labs  Lab 03/16/18 1108  AST 16  ALT 20  ALKPHOS 62  BILITOT 0.8  PROT 7.4  ALBUMIN 3.8   No results for input(s): LIPASE, AMYLASE in the last 168 hours. No results for input(s): AMMONIA in the last 168 hours. CBC: Recent Labs  Lab 03/16/18 1108  WBC 7.0  NEUTROABS 4.5  HGB 11.2*  HCT 33.2*  MCV 71.4*  PLT 211   Cardiac Enzymes: No results for input(s): CKTOTAL, CKMB, CKMBINDEX, TROPONINI in the last 168 hours. BNP: Invalid input(s): POCBNP CBG: No results for input(s): GLUCAP in the last 168 hours.  Time coordinating discharge: 40 minutes  Signed: Nolon NationsLachina Moore Shariya Gaster  APRN, MSN, FNP-C Patient Care [facility] [address]  [place], [place] [id number] [phone]  [facility] 03/18/2018, 9:14 AM

## 2018-03-18 NOTE — Telephone Encounter (Signed)
Patient notified and states that she understands and we will to here from [place] on tomorrow.

## 2018-03-18 NOTE — Telephone Encounter (Signed)
Patient is calling again about medication been sent to pharmacy

## 2018-03-19 ENCOUNTER — Other Ambulatory Visit: Payer: Self-pay | Admitting: Family Medicine

## 2018-03-19 DIAGNOSIS — F1190 Opioid use, unspecified, uncomplicated: Secondary | ICD-10-CM

## 2018-03-19 DIAGNOSIS — D5700 Hb-SS disease with crisis, unspecified: Secondary | ICD-10-CM

## 2018-03-19 DIAGNOSIS — G894 Chronic pain syndrome: Secondary | ICD-10-CM

## 2018-03-19 MED ORDER — OXYCODONE HCL 10 MG PO TABS: 20.0000 mg | ORAL_TABLET | ORAL | 0 refills | Status: DC | PRN

## 2018-03-19 NOTE — Telephone Encounter (Signed)
Patient notified

## 2018-03-23 ENCOUNTER — Other Ambulatory Visit: Payer: Self-pay | Admitting: Family Medicine

## 2018-03-23 DIAGNOSIS — G894 Chronic pain syndrome: Secondary | ICD-10-CM

## 2018-03-23 DIAGNOSIS — R110 Nausea: Secondary | ICD-10-CM

## 2018-03-23 DIAGNOSIS — F1190 Opioid use, unspecified, uncomplicated: Secondary | ICD-10-CM

## 2018-03-24 ENCOUNTER — Other Ambulatory Visit: Payer: Self-pay

## 2018-03-24 ENCOUNTER — Telehealth: Payer: Self-pay

## 2018-03-24 DIAGNOSIS — G894 Chronic pain syndrome: Secondary | ICD-10-CM

## 2018-03-24 DIAGNOSIS — F1190 Opioid use, unspecified, uncomplicated: Secondary | ICD-10-CM

## 2018-03-24 DIAGNOSIS — R110 Nausea: Secondary | ICD-10-CM

## 2018-03-24 MED ORDER — CYCLOBENZAPRINE HCL 10 MG PO TABS: 10.0000 mg | ORAL_TABLET | Freq: Three times a day (TID) | ORAL | 2 refills | Status: DC | PRN

## 2018-03-24 MED ORDER — ONDANSETRON HCL 4 MG PO TABS: 4.0000 mg | ORAL_TABLET | Freq: Three times a day (TID) | ORAL | 1 refills | Status: DC | PRN

## 2018-03-24 NOTE — Telephone Encounter (Signed)
Medication sent to pharmacy  

## 2018-03-30 ENCOUNTER — Other Ambulatory Visit: Payer: Self-pay | Admitting: Family Medicine

## 2018-03-30 ENCOUNTER — Telehealth: Payer: Self-pay

## 2018-03-30 DIAGNOSIS — F1190 Opioid use, unspecified, uncomplicated: Secondary | ICD-10-CM

## 2018-03-30 DIAGNOSIS — D571 Sickle-cell disease without crisis: Secondary | ICD-10-CM

## 2018-03-30 DIAGNOSIS — G894 Chronic pain syndrome: Secondary | ICD-10-CM

## 2018-03-30 MED ORDER — OXYCODONE HCL 20 MG PO TABS: 20.0000 mg | ORAL_TABLET | ORAL | 0 refills | Status: DC | PRN

## 2018-03-30 NOTE — Telephone Encounter (Signed)
Patient would like to keep the script for the oxycodone 10mg  because sometimes she doesn't always needs to take the whole 20mg . Patient states that they work better.

## 2018-03-31 ENCOUNTER — Encounter (HOSPITAL_COMMUNITY): Payer: Self-pay | Admitting: *Deleted

## 2018-03-31 ENCOUNTER — Telehealth (HOSPITAL_COMMUNITY): Payer: Self-pay | Admitting: General Practice

## 2018-03-31 ENCOUNTER — Non-Acute Institutional Stay (HOSPITAL_COMMUNITY)
Admission: AD | Admit: 2018-03-31 | Discharge: 2018-03-31 | Disposition: A | Discharge status: Home / Self Care | Payer: Medicaid Other | Source: Ambulatory Visit | Attending: Internal Medicine | Admitting: Internal Medicine

## 2018-03-31 DIAGNOSIS — Z79899 Other long term (current) drug therapy: Secondary | ICD-10-CM | POA: Diagnosis not present

## 2018-03-31 DIAGNOSIS — G894 Chronic pain syndrome: Secondary | ICD-10-CM | POA: Insufficient documentation

## 2018-03-31 DIAGNOSIS — Z8249 Family history of ischemic heart disease and other diseases of the circulatory system: Secondary | ICD-10-CM | POA: Diagnosis not present

## 2018-03-31 DIAGNOSIS — D5700 Hb-SS disease with crisis, unspecified: Secondary | ICD-10-CM | POA: Diagnosis not present

## 2018-03-31 LAB — CBC WITH DIFFERENTIAL/PLATELET
Abs Immature Granulocytes: 0.03 10*3/uL (ref 0.00–0.07)
BASOS PCT: 0 %
Basophils Absolute: 0 10*3/uL (ref 0.0–0.1)
Eosinophils Absolute: 0.1 10*3/uL (ref 0.0–0.5)
Eosinophils Relative: 2 %
HEMATOCRIT: 33.7 % — AB (ref 36.0–46.0)
Hemoglobin: 11.3 g/dL — ABNORMAL LOW (ref 12.0–15.0)
Immature Granulocytes: 1 %
Lymphocytes Relative: 25 %
Lymphs Abs: 1.4 10*3/uL (ref 0.7–4.0)
MCH: 23.3 pg — ABNORMAL LOW (ref 26.0–34.0)
MCHC: 33.5 g/dL (ref 30.0–36.0)
MCV: 69.6 fL — ABNORMAL LOW (ref 80.0–100.0)
Monocytes Absolute: 0.3 10*3/uL (ref 0.1–1.0)
Monocytes Relative: 5 %
Neutro Abs: 3.7 10*3/uL (ref 1.7–7.7)
Neutrophils Relative %: 67 %
Platelets: 238 10*3/uL (ref 150–400)
RBC: 4.84 MIL/uL (ref 3.87–5.11)
RDW: 14.2 % (ref 11.5–15.5)
WBC: 5.5 10*3/uL (ref 4.0–10.5)
nRBC: 0 % (ref 0.0–0.2)

## 2018-03-31 LAB — COMPREHENSIVE METABOLIC PANEL
ALT: 16 U/L (ref 0–44)
AST: 14 U/L — ABNORMAL LOW (ref 15–41)
Albumin: 3.8 g/dL (ref 3.5–5.0)
Alkaline Phosphatase: 73 U/L (ref 38–126)
Anion gap: 6 (ref 5–15)
BILIRUBIN TOTAL: 1 mg/dL (ref 0.3–1.2)
BUN: 7 mg/dL (ref 6–20)
CO2: 24 mmol/L (ref 22–32)
Calcium: 9 mg/dL (ref 8.9–10.3)
Chloride: 110 mmol/L (ref 98–111)
Creatinine, Ser: 0.64 mg/dL (ref 0.44–1.00)
GFR calc Af Amer: >60 mL/min (ref >60)
GFR calc non Af Amer: >60 mL/min (ref >60)
Glucose, Bld: 98 mg/dL (ref 70–99)
Potassium: 3.8 mmol/L (ref 3.5–5.1)
Sodium: 140 mmol/L (ref 135–145)
TOTAL PROTEIN: 7.2 g/dL (ref 6.5–8.1)

## 2018-03-31 LAB — RAPID URINE DRUG SCREEN, HOSP PERFORMED
AMPHETAMINES: NOT DETECTED
Barbiturates: NOT DETECTED
Benzodiazepines: NOT DETECTED
Cocaine: NOT DETECTED
Opiates: NOT DETECTED
Tetrahydrocannabinol: NOT DETECTED

## 2018-03-31 LAB — PREGNANCY, URINE: Preg Test, Ur: NEGATIVE

## 2018-03-31 MED ORDER — SODIUM CHLORIDE 0.45 % IV SOLN
INTRAVENOUS | Status: DC
  Administered 2018-03-31: 10:00:00 via INTRAVENOUS

## 2018-03-31 MED ORDER — KETOROLAC TROMETHAMINE 30 MG/ML IJ SOLN
30.0000 mg | Freq: Once | INTRAMUSCULAR | Status: AC
  Administered 2018-03-31: 30 mg via INTRAVENOUS
  Filled 2018-03-31: qty 1

## 2018-03-31 MED ORDER — ONDANSETRON HCL 4 MG/2ML IJ SOLN: 4.0000 mg | Freq: Four times a day (QID) | INTRAMUSCULAR | Status: DC | PRN

## 2018-03-31 MED ORDER — HYDROMORPHONE 1 MG/ML IV SOLN
INTRAVENOUS | Status: AC
  Administered 2018-03-31: 30 mg via INTRAVENOUS
  Administered 2018-03-31: 12.4 mg via INTRAVENOUS
  Filled 2018-03-31: qty 30

## 2018-03-31 MED ORDER — ACETAMINOPHEN 500 MG PO TABS
1000.0000 mg | ORAL_TABLET | Freq: Once | ORAL | Status: AC
  Administered 2018-03-31: 1000 mg via ORAL
  Filled 2018-03-31: qty 2

## 2018-03-31 MED ORDER — NALOXONE HCL 0.4 MG/ML IJ SOLN: 0.4000 mg | INTRAMUSCULAR | Status: DC | PRN

## 2018-03-31 MED ORDER — SODIUM CHLORIDE 0.9% FLUSH: 9.0000 mL | INTRAVENOUS | Status: DC | PRN

## 2018-03-31 NOTE — Discharge Summary (Signed)
[facility] Discharge Summary   Patient ID: Stephanie Burch MRN: [id number] DOB/AGE: 10/28/1980 38 y.o.  Admit date: 03/31/2018 Discharge date: 03/31/2018  Primary Care Physician:  Kallie Locks, FNP  Admission Diagnoses:  Active Problems:   Sickle cell pain crisis [facility])   Discharge Medications:  Allergies as of 03/31/2018   No Known Allergies     Medication List    TAKE these medications   cyclobenzaprine 10 MG tablet Commonly known as:  FLEXERIL Take 1 tablet (10 mg total) by mouth 3 (three) times daily as needed for muscle spasms.   folic acid 1 MG tablet Commonly known as:  FOLVITE Take 1 tablet (1 mg total) by mouth daily.   hydroxyurea 500 MG capsule Commonly known as:  HYDREA Take 4 capsules (2,000 mg total) by mouth daily. May take with food to minimize GI side effects.   ibuprofen 800 MG tablet Commonly known as:  ADVIL,MOTRIN Take 1 tablet (800 mg total) by mouth every 8 (eight) hours as needed for headache, mild pain, moderate pain or cramping.   ondansetron 4 MG/2ML Soln injection Commonly known as:  ZOFRAN Inject 2 mLs (4 mg total) into the vein every 6 (six) hours as needed for nausea or vomiting.   ondansetron 4 MG tablet Commonly known as:  ZOFRAN Take 1 tablet (4 mg total) by mouth every 8 (eight) hours as needed for nausea or vomiting.   oxyCODONE 10 mg 12 hr tablet Commonly known as:  OXYCONTIN Take 1 tablet (10 mg total) by mouth every 12 (twelve) hours.   Oxycodone HCl 20 MG Tabs Take 1 tablet (20 mg total) by mouth every 4 (four) hours as needed for up to 15 days. Start taking on:  April 02, 2018   Vitamin D (Ergocalciferol) 1.25 MG (50000 UT) Caps capsule Commonly known as:  DRISDOL Take 1 capsule (50,000 Units total) by mouth every 7 (seven) days.        Consults:  None  Significant Diagnostic Studies:  No results found.   [facility] Course: Stephanie Burch, a 38 year old female with  medical history significant for sickle cell anemia, hemoglobin [place], chronic pain syndrome, morbid obesity was admitted to day infusion center for pain management and extended observation.  Reviewed all laboratory values, hemoglobin 11.3 which is consistent with patient's baseline. All of the laboratory values within normal range. Patient afebrile and maintaining oxygen saturation above 90% on RA  Initiated IV fluids 0.45% saline at 100 mL/h Toradol 15 mg IV x1 Tylenol 1000 mg x 1 Dilaudid PCA per weight-based protocol.  Settings of 0.6 mg, 10-minute lockout, and 4 mg/h Pain intensity decreased to 5/10.  Patient alert, oriented, and ambulating without assistance. Patient will discharge home in a hemodynamically stable condition.  Discharge instructions: Patient advised to follow-up in primary care as scheduled for medication management.  She was also advised to contact primary provider when medications are ineffective. Resume all home medications Continue to hydrate with 64 ounces of fluid Avoid all stressors that precipitate sickle pain crisis   The patient was given clear instructions to go to ER or return to medical center if symptoms do not improve, worsen or new problems develop.     Physical Exam at Discharge:  BP 134/88 (BP Location: Right Arm)   Pulse (!) 104   Temp 98.4 F (36.9 C) (Oral)   Resp 13   LMP 02/25/2018   SpO2 96%  Physical Exam Constitutional:      Appearance:  She is obese.  Eyes:     Pupils: Pupils are equal, round, and reactive to light.  Neck:     Musculoskeletal: Normal range of motion.  Cardiovascular:     Rate and Rhythm: Normal rate and regular rhythm.  Pulmonary:     Effort: Pulmonary effort is normal.     Breath sounds: Normal breath sounds.  Abdominal:     General: Bowel sounds are normal.  Musculoskeletal: Normal range of motion.  Neurological:     General: No focal deficit present.     Mental Status: She is alert. Mental status is at  baseline.  Psychiatric:        Mood and Affect: Mood normal.        Behavior: Behavior normal.        Thought Content: Thought content normal.        Judgment: Judgment normal.      Disposition at Discharge: Discharge disposition: 01-Home or Self Care       Discharge Orders: Discharge Instructions    Discharge patient   Complete by:  As directed    Discharge disposition:  01-Home or Self Care   Discharge patient date:  03/31/2018      Condition at Discharge:   Stable  Time spent on Discharge:  Greater than 30 minutes.  Signed:  Nolon Nations  APRN, MSN, FNP-C Patient Care [facility] [address] [place], [place] [address] [phone]  03/31/2018, 2:11 PM

## 2018-03-31 NOTE — Telephone Encounter (Signed)
Patient is in the day hospital and called again asking to speak with you regarding medication

## 2018-03-31 NOTE — Discharge Instructions (Signed)
All of your labs are consistent with your baseline.  I recommend that you follow-up in primary care with [name], FNP for pain management.  Your pain has not been controlled on your current medication regimen, please notify your primary provider of your situation. Continue to hydrate with 64 ounces of fluids Resume all home medications    The patient was given clear instructions to go to ER or return to medical center if symptoms do not improve, worsen or new problems develop.  Sickle Cell Anemia, Adult Sickle cell anemia is a condition where your red blood cells are shaped like sickles. Red blood cells carry oxygen through the body. Sickle-shaped cells do not live as long as normal red blood cells. They also clump together and block blood from flowing through the blood vessels. This prevents the body from getting enough oxygen. Sickle cell anemia causes organ damage and pain. It also increases the risk of infection. Follow these instructions at home: Medicines  Take over-the-counter and prescription medicines only as told by your doctor.  If you were prescribed an antibiotic medicine, take it as told by your doctor. Do not stop taking the antibiotic even if you start to feel better.  If you develop a fever, do not take medicines to lower the fever right away. Tell your doctor about the fever. Managing pain, stiffness, and swelling  Try these methods to help with pain: ? Use a heating pad. ? Take a warm bath. ? Distract yourself, such as by watching TV. Eating and drinking  Drink enough fluid to keep your pee (urine) clear or pale yellow. Drink more in hot weather and during exercise.  Limit or avoid alcohol.  Eat a healthy diet. Eat plenty of fruits, vegetables, whole grains, and lean protein.  Take vitamins and supplements as told by your doctor. Traveling  When traveling, keep these with you: ? Your medical information. ? The names of your doctors. ? Your  medicines.  If you need to take an airplane, talk to your doctor first. Activity  Rest often.  Avoid exercises that make your heart beat much faster, such as jogging. General instructions  Do not use products that have nicotine or tobacco, such as cigarettes and e-cigarettes. If you need help quitting, ask your doctor.  Consider wearing a medical alert bracelet.  Avoid being in high places (high altitudes), such as mountains.  Avoid very hot or cold temperatures.  Avoid places where the temperature changes a lot.  Keep all follow-up visits as told by your doctor. This is important. Contact a doctor if:  A joint hurts.  Your feet or hands hurt or swell.  You feel tired (fatigued). Get help right away if:  You have symptoms of infection. These include: ? Fever. ? Chills. ? Being very tired. ? Irritability. ? Poor eating. ? Throwing up (vomiting).  You feel dizzy or faint.  You have new stomach pain, especially on the left side.  You have a an erection (priapism) that lasts more than 4 hours.  You have numbness in your arms or legs.  You have a hard time moving your arms or legs.  You have trouble talking.  You have pain that does not go away when you take medicine.  You are short of breath.  You are breathing fast.  You have a long-term cough.  You have pain in your chest.  You have a bad headache.  You have a stiff neck.  Your stomach looks bloated even though  you did not eat much.  Your skin is pale.  You suddenly cannot see well. Summary  Sickle cell anemia is a condition where your red blood cells are shaped like sickles.  Follow your doctor's advice on ways to manage pain, food to eat, activities to do, and steps to take for safe travel.  Get medical help right away if you have any signs of infection, such as a fever. This information is not intended to replace advice given to you by your health care provider. Make sure you discuss any  questions you have with your health care provider. Document Released: 11/10/2012 Document Revised: 02/26/2016 Document Reviewed: 02/26/2016 Elsevier Interactive Patient Education  2019 [employer].

## 2018-03-31 NOTE — H&P (Signed)
[facility] History and Physical   Date: 03/31/2018  Patient name: Stephanie Burch Medical record number: [id number] Date of birth: 1980-06-13 Age: 38 y.o. Gender: female PCP: Kallie Locks, FNP  Attending physician: Quentin Angst, MD  Chief Complaint: Bilateral lower extremity pain  History of Present Illness: Stephanie Burch, a 38 year old female with a medical history significant for sickle cell anemia, hemoglobin [place], chronic pain syndrome, and morbid obesity presents complaining of bilateral lower extremity pain that is consistent with typical sickle cell crisis.  Patient states that pain increased several days ago and has not been controlled on home medications.  Patient states that she has had to take more medication than prescribed medication for greater pain control.  Current pain intensity is 9/10 characterized as constant and throbbing.  Patient states that pain intensity is worsened by weightbearing.  She last had oxycodone this a.m. without sustained relief. Patient denies shortness of breath, headache, dizziness, paresthesias, fever, chills, dysuria, vomiting, or diarrhea.  Patient endorses nausea.  Patient will be admitted to infusion center for pain management and extended observation.  Meds: Medications Prior to Admission  Medication Sig Dispense Refill Last Dose  . cyclobenzaprine (FLEXERIL) 10 MG tablet Take 1 tablet (10 mg total) by mouth 3 (three) times daily as needed for muscle spasms. 30 tablet 2   . folic acid (FOLVITE) 1 MG tablet Take 1 tablet (1 mg total) by mouth daily. 30 tablet 11 03/15/2018 at Unknown time  . hydroxyurea (HYDREA) 500 MG capsule Take 4 capsules (2,000 mg total) by mouth daily. May take with food to minimize GI side effects. 120 capsule 1 Taking  . ibuprofen (ADVIL,MOTRIN) 800 MG tablet Take 1 tablet (800 mg total) by mouth every 8 (eight) hours as needed for headache, mild pain, moderate pain or cramping. 30 tablet 3 Past  Week at Unknown time  . ondansetron (ZOFRAN) 4 MG tablet Take 1 tablet (4 mg total) by mouth every 8 (eight) hours as needed for nausea or vomiting. 20 tablet 1   . ondansetron (ZOFRAN) 4 MG/2ML SOLN injection Inject 2 mLs (4 mg total) into the vein every 6 (six) hours as needed for nausea or vomiting. (Patient not taking: Reported on 03/16/2018) 20 mL 2 Not Taking at Unknown time  . oxyCODONE (OXYCONTIN) 10 mg 12 hr tablet Take 1 tablet (10 mg total) by mouth every 12 (twelve) hours. 14 tablet 0   . [START ON 04/02/2018] Oxycodone HCl 20 MG TABS Take 1 tablet (20 mg total) by mouth every 4 (four) hours as needed for up to 15 days. 90 tablet 0   . Vitamin D, Ergocalciferol, (DRISDOL) 50000 units CAPS capsule Take 1 capsule (50,000 Units total) by mouth every 7 (seven) days. 5 capsule 2 Past Month at Unknown time    Allergies: Patient has no known allergies. Past Medical History:  Diagnosis Date  . Sickle cell anemia (HCC)    No past surgical history on file. Family History  Problem Relation Age of Onset  . Hypertension Mother   . Sickle cell trait Mother   . Diabetes Father   . Sickle cell trait Father    Social History   Socioeconomic History  . Marital status: Single    Spouse name: Not on file  . Number of children: Not on file  . Years of education: Not on file  . Highest education level: Not on file  Occupational History  . Not on file  Social Needs  .  Financial resource strain: Not on file  . Food insecurity:    Worry: Not on file    Inability: Not on file  . Transportation needs:    Medical: Not on file    Non-medical: Not on file  Tobacco Use  . Smoking status: Never Smoker  . Smokeless tobacco: Never Used  Substance and Sexual Activity  . Alcohol use: Never    Frequency: Never  . Drug use: Never  . Sexual activity: Yes    Birth control/protection: None  Lifestyle  . Physical activity:    Days per week: Not on file    Minutes per session: Not on file  .  Stress: Not on file  Relationships  . Social connections:    Talks on phone: Not on file    Gets together: Not on file    Attends religious service: Not on file    Active member of club or organization: Not on file    Attends meetings of clubs or organizations: Not on file    Relationship status: Not on file  . Intimate partner violence:    Fear of current or ex partner: Not on file    Emotionally abused: Not on file    Physically abused: Not on file    Forced sexual activity: Not on file  Other Topics Concern  . Not on file  Social History Narrative  . Not on file  Review of Systems  Constitutional: Negative.   HENT: Negative.   Eyes: Negative.   Respiratory: Negative.   Cardiovascular: Negative.   Gastrointestinal: Negative.   Genitourinary: Negative.  Negative for dysuria.  Musculoskeletal: Positive for joint pain.  Skin: Negative.   Neurological: Negative.   Endo/Heme/Allergies: Negative.   Psychiatric/Behavioral: Negative for depression and suicidal ideas.    Physical Exam: Last menstrual period 03/02/2018. Physical Exam Constitutional:      General: She is in acute distress.     Appearance: She is obese.  HENT:     Head: Normocephalic.  Cardiovascular:     Rate and Rhythm: Normal rate and regular rhythm.     Pulses: Normal pulses.     Heart sounds: Normal heart sounds.  Pulmonary:     Effort: Pulmonary effort is normal.     Breath sounds: Normal breath sounds.  Abdominal:     General: Bowel sounds are normal.     Palpations: Abdomen is soft.  Musculoskeletal: Normal range of motion.  Skin:    General: Skin is warm and dry.  Neurological:     General: No focal deficit present.     Mental Status: She is alert and oriented to person, place, and time.  Psychiatric:        Mood and Affect: Mood normal.        Behavior: Behavior normal.      Lab results: No results found for this or any previous visit (from the past 24 hour(s)).  Imaging results:   No results found.   Assessment & Plan:  Patient will be admitted to the day infusion center for extended observation  Initiate 0.45% saline at 100 ml/hr  Toradol 15 mg times one   Dilaudid PCA High Concentration per weight based protocol. Settings of 0.6 mg, 10 minute lockout, and 4 mg per hour.  Patient will be re-evaluated for pain intensity in the context of function and relationship to baseline as care progresses.  If no significant pain relief, will transfer patient to inpatient services for a higher level of care.  CMP, CBC w/differential, Reticulocytes, urine pregnancy  Nolon Nations  APRN, MSN, FNP-C Patient Care [facility] [address] [place], [place] [address] [phone]  03/31/2018, 9:01 AM

## 2018-03-31 NOTE — Progress Notes (Addendum)
Patient admitted to the day infusion hospital for sickle cell pain crisis. Patient initially reported bilateral leg pain rated 8/10. For pain management, patient placed on Dilaudid PCA, given 1000 mg Toradol, 30 mg Tylenol and hydrated with IV fluids.  At discharge, patient rated pain level 5/10. Vital signs stable. Discharge instructions given. Patient alert, oriented and ambulatory at discharge.

## 2018-03-31 NOTE — Telephone Encounter (Signed)
Patient called, complained of pain in the legs that she rated as 8/10. Denied chest pain, fever, diarrhea, abdominal pain, nausea/vomitting. Admitted to not having means of transportation after treatment. Last took Oxycodone 10 mg at 04:00 today. Provider notified; per provider, patient will be treated and cut off pain medications early during the shift because of transportation issues. Patient notified, verbalized understanding.

## 2018-04-02 ENCOUNTER — Telehealth: Payer: Self-pay

## 2018-04-02 NOTE — Telephone Encounter (Signed)
I have entered prior authorization on [place] [employer] and sent a message to S[facility] Neurosurgeon in [place]. Thanks !

## 2018-04-06 NOTE — Telephone Encounter (Signed)
Patient pick up the Oxycodone 20mg  but had to pay cash because Medicaid does not cover the 20mg . Patient comes in on the 9th and would like to have script changed back so insurance covers it.

## 2018-04-12 ENCOUNTER — Encounter: Payer: Self-pay | Admitting: Family Medicine

## 2018-04-12 ENCOUNTER — Ambulatory Visit (INDEPENDENT_AMBULATORY_CARE_PROVIDER_SITE_OTHER): Payer: Self-pay | Admitting: Family Medicine

## 2018-04-12 VITALS — BP 136/92 | HR 94 | Temp 98.0°F | Ht 65.0 in | Wt 281.0 lb

## 2018-04-12 DIAGNOSIS — G894 Chronic pain syndrome: Secondary | ICD-10-CM

## 2018-04-12 DIAGNOSIS — F1190 Opioid use, unspecified, uncomplicated: Secondary | ICD-10-CM

## 2018-04-12 DIAGNOSIS — Z09 Encounter for follow-up examination after completed treatment for conditions other than malignant neoplasm: Secondary | ICD-10-CM

## 2018-04-12 DIAGNOSIS — D571 Sickle-cell disease without crisis: Secondary | ICD-10-CM

## 2018-04-12 LAB — POCT URINALYSIS DIP (MANUAL ENTRY)
Bilirubin, UA: NEGATIVE
Blood, UA: NEGATIVE
Glucose, UA: NEGATIVE mg/dL
Ketones, POC UA: NEGATIVE mg/dL
Leukocytes, UA: NEGATIVE
Nitrite, UA: NEGATIVE
Protein Ur, POC: NEGATIVE mg/dL
Spec Grav, UA: 1.015 (ref 1.010–1.025)
Urobilinogen, UA: 1 E.U./dL
pH, UA: 6 (ref 5.0–8.0)

## 2018-04-12 MED ORDER — OXYCODONE HCL 10 MG PO TABS: ORAL_TABLET | ORAL | 0 refills | Status: DC

## 2018-04-12 MED ORDER — OXYCODONE HCL ER 10 MG PO T12A: 10.0000 mg | EXTENDED_RELEASE_TABLET | Freq: Two times a day (BID) | ORAL | 0 refills | Status: DC

## 2018-04-12 NOTE — Progress Notes (Signed)
[facility]  Sickle Cell Anemia Follow Up  Subjective:  Patient ID: Stephanie Burch, female    DOB: 1980/11/23  Age: 38 y.o. MRN: [id number]  CC:  Chief Complaint  Patient presents with  . Follow-up    sickle cell   HPI Stephanie Burch is a 38 year old female who presents for follow up of Sickle Cell Anemia today.   Past Medical History:  Diagnosis Date  . Sickle cell anemia (HCC)    Current Status: Since her last office visit, she is doing well with no complaints. She states that she has pain in her arms and legs. She rates her pain today at 5/10. She has not has a hospital visit for Sickle Cell Crisis since 03/31/2018 where she was treated and discharged the same day. She is currently taking all medications as prescribed and staying well hydrated. She reports occasional dizziness and headaches.    She denies fevers, chills, fatigue, recent infections, weight loss, and night sweats. She has not had any headaches, visual changes, dizziness, and falls. No chest pain, heart palpitations, cough and shortness of breath reported. No reports of GI problems such as nausea, vomiting, diarrhea, and constipation. She has no reports of blood in stools, dysuria and hematuria. No depression or anxiety reported.   No past surgical history on file.  Family History  Problem Relation Age of Onset  . Hypertension Mother   . Sickle cell trait Mother   . Diabetes Father   . Sickle cell trait Father     Social History   Socioeconomic History  . Marital status: Single    Spouse name: Not on file  . Number of children: Not on file  . Years of education: Not on file  . Highest education level: Not on file  Occupational History  . Not on file  Social Needs  . Financial resource strain: Not on file  . Food insecurity:    Worry: Not on file    Inability: Not on file  . Transportation needs:    Medical: Not on file    Non-medical: Not on file    Tobacco Use  . Smoking status: Never Smoker  . Smokeless tobacco: Never Used  Substance and Sexual Activity  . Alcohol use: Never    Frequency: Never  . Drug use: Never  . Sexual activity: Yes    Birth control/protection: None  Lifestyle  . Physical activity:    Days per week: Not on file    Minutes per session: Not on file  . Stress: Not on file  Relationships  . Social connections:    Talks on phone: Not on file    Gets together: Not on file    Attends religious service: Not on file    Active member of club or organization: Not on file    Attends meetings of clubs or organizations: Not on file    Relationship status: Not on file  . Intimate partner violence:    Fear of current or ex partner: Not on file    Emotionally abused: Not on file    Physically abused: Not on file    Forced sexual activity: Not on file  Other Topics Concern  . Not on file  Social History Narrative  . Not on file    Outpatient Medications Prior to Visit  Medication Sig Dispense Refill  . cyclobenzaprine (FLEXERIL) 10 MG tablet Take 1 tablet (10 mg total) by mouth 3 (  three) times daily as needed for muscle spasms. 30 tablet 2  . folic acid (FOLVITE) 1 MG tablet Take 1 tablet (1 mg total) by mouth daily. 30 tablet 11  . ibuprofen (ADVIL,MOTRIN) 800 MG tablet Take 1 tablet (800 mg total) by mouth every 8 (eight) hours as needed for headache, mild pain, moderate pain or cramping. 30 tablet 3  . ondansetron (ZOFRAN) 4 MG tablet Take 1 tablet (4 mg total) by mouth every 8 (eight) hours as needed for nausea or vomiting. 20 tablet 1  . Oxycodone HCl 20 MG TABS Take 1 tablet (20 mg total) by mouth every 4 (four) hours as needed for up to 15 days. 90 tablet 0  . Vitamin D, Ergocalciferol, (DRISDOL) 50000 units CAPS capsule Take 1 capsule (50,000 Units total) by mouth every 7 (seven) days. 5 capsule 2  . hydroxyurea (HYDREA) 500 MG capsule Take 4 capsules (2,000 mg total) by mouth daily. May take with food to  minimize GI side effects. (Patient not taking: Reported on 04/12/2018) 120 capsule 1  . oxyCODONE (OXYCONTIN) 10 mg 12 hr tablet Take 1 tablet (10 mg total) by mouth every 12 (twelve) hours. (Patient not taking: Reported on 04/12/2018) 14 tablet 0  . ondansetron (ZOFRAN) 4 MG/2ML SOLN injection Inject 2 mLs (4 mg total) into the vein every 6 (six) hours as needed for nausea or vomiting. (Patient not taking: Reported on 03/16/2018) 20 mL 2   No facility-administered medications prior to visit.     No Known Allergies  ROS Review of Systems  Constitutional: Negative.   HENT: Negative.   Eyes: Negative.   Respiratory: Negative.   Cardiovascular: Negative.   Gastrointestinal: Negative.   Endocrine: Negative.   Genitourinary: Negative.   Musculoskeletal: Negative.   Skin: Negative.   Allergic/Immunologic: Negative.   Neurological: Positive for dizziness and headaches.  Hematological: Negative.   Psychiatric/Behavioral: Negative.    Objective:    Physical Exam  Constitutional: She is oriented to person, place, and time. She appears well-developed and well-nourished.  HENT:  Head: Normocephalic and atraumatic.  Eyes: Conjunctivae are normal.  Neck: Normal range of motion. Neck supple.  Cardiovascular: Normal rate, regular rhythm, normal heart sounds and intact distal pulses.  Pulmonary/Chest: Effort normal and breath sounds normal.  Abdominal: Soft. Bowel sounds are normal.  Musculoskeletal: Normal range of motion.  Neurological: She is alert and oriented to person, place, and time. She has normal reflexes.  Skin: Skin is warm and dry.  Psychiatric: She has a normal mood and affect. Her behavior is normal. Judgment and thought content normal.   BP (!) 136/92 (BP Location: Left Arm, Patient Position: Sitting, Cuff Size: Large)   Pulse 94   Temp 98 F (36.7 C) (Oral)   Ht  (1.651 m)   Wt 281 lb (127.5 kg)   LMP 03/26/2018   SpO2 100%   BMI 46.76 kg/m  Wt Readings from Last 3  Encounters:  04/12/18 281 lb (127.5 kg)  03/17/18 271 lb 4.8 oz (123.1 kg)  03/16/18 279 lb (126.6 kg)     Health Maintenance Due  Topic Date Due  . PAP SMEAR-Modifier  01/13/2004  . INFLUENZA VACCINE  09/03/2017    There are no preventive care reminders to display for this patient.  Lab Results  Component Value Date   TSH 0.862 09/11/2017   Lab Results  Component Value Date   WBC 5.5 03/31/2018   HGB 11.3 (L) 03/31/2018   HCT 33.7 (L) 03/31/2018  MCV 69.6 (L) 03/31/2018   PLT 238 03/31/2018   Lab Results  Component Value Date   NA 140 03/31/2018   K 3.8 03/31/2018   CO2 24 03/31/2018   GLUCOSE 98 03/31/2018   BUN 7 03/31/2018   CREATININE 0.64 03/31/2018   BILITOT 1.0 03/31/2018   ALKPHOS 73 03/31/2018   AST 14 (L) 03/31/2018   ALT 16 03/31/2018   PROT 7.2 03/31/2018   ALBUMIN 3.8 03/31/2018   CALCIUM 9.0 03/31/2018   ANIONGAP 6 03/31/2018   Lab Results  Component Value Date   CHOL 92 (L) 09/11/2017   Lab Results  Component Value Date   HDL 45 09/11/2017   Lab Results  Component Value Date   LDLCALC 36 09/11/2017   Lab Results  Component Value Date   TRIG 53 09/11/2017   Lab Results  Component Value Date   CHOLHDL 2.0 09/11/2017   Lab Results  Component Value Date   HGBA1C 4.1 09/11/2017   Assessment & Plan:   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) She is doing well today. She will continue to take pain medications as prescribed; will continue to avoid extreme heat and cold; will continue to eat a healthy diet and drink at least 64 ounces of water daily; continue stool softener as needed; will avoid colds and flu; will continue to get plenty of sleep and rest; will continue to avoid high stressful situations and remain infection free; will continue Folic Acid 1 mg daily to avoid sickle cell crisis.  - oxyCODONE (OXYCONTIN) 10 mg 12 hr tablet; Take 1 tablet (10 mg total) by mouth every 12 (twelve) hours.  Dispense: 60 tablet; Refill: 0 -  Oxycodone HCl 10 MG TABS; Take 2 tablets (total dose = 20 mg), every 4 hours as needed for pain.  Dispense: 180 tablet; Refill: 0  2. Chronic, continuous use of opioids Last drug screen on 03/31/2018 was negative for Opioids. We will re-evaluate drug screen today. Results are pending.  - Drug Screen, Urine - oxyCODONE (OXYCONTIN) 10 mg 12 hr tablet; Take 1 tablet (10 mg total) by mouth every 12 (twelve) hours.  Dispense: 60 tablet; Refill: 0 - Oxycodone HCl 10 MG TABS; Take 2 tablets (total dose = 20 mg), every 4 hours as needed for pain.  Dispense: 180 tablet; Refill: 0  3. Chronic pain syndrome - oxyCODONE (OXYCONTIN) 10 mg 12 hr tablet; Take 1 tablet (10 mg total) by mouth every 12 (twelve) hours.  Dispense: 60 tablet; Refill: 0 - Oxycodone HCl 10 MG TABS; Take 2 tablets (total dose = 20 mg), every 4 hours as needed for pain.  Dispense: 180 tablet; Refill: 0  4. Follow up She will follow up in  - POCT urinalysis dipstick  Meds ordered this encounter  Medications  . oxyCODONE (OXYCONTIN) 10 mg 12 hr tablet    Sig: Take 1 tablet (10 mg total) by mouth every 12 (twelve) hours.    Dispense:  60 tablet    Refill:  0    Order Specific Question:   Supervising Provider    Answer:   Quentin Angst [id number]  . Oxycodone HCl 10 MG TABS    Sig: Take 2 tablets (total dose = 20 mg), every 4 hours as needed for pain.    Dispense:  180 tablet    Refill:  0    Please do no refill this medication early. Do not refill prior to 04/17/2018. Thank you.    Order Specific Question:  Supervising Provider    Answer:   Quentin Angst [[id number]]    Orders Placed This Encounter  Procedures  . POCT urinalysis dipstick  . Drug Screen, Urine    Referral Orders  No referral(s) requested today    [name],  MSN, FNP-C [facility] [address] [place], [place] [address] [phone]   Problem List Items Addressed This Visit    None    Visit  Diagnoses    Follow up    -  Primary   Relevant Orders   POCT urinalysis dipstick (Completed)      No orders of the defined types were placed in this encounter.   Follow-up: No follow-ups on file.    Kallie Locks, FNP

## 2018-04-13 DIAGNOSIS — D571 Sickle-cell disease without crisis: Secondary | ICD-10-CM | POA: Insufficient documentation

## 2018-04-21 ENCOUNTER — Telehealth: Payer: Self-pay

## 2018-04-23 NOTE — Telephone Encounter (Signed)
error 

## 2018-04-23 NOTE — Telephone Encounter (Signed)
Patient states that she is having constipation and wants to know if there is anything she can take to prevent this. Also wanted to give [place] a FYI that her last script said it was for 30 days and it was the same dosage for her 15 day. Her script is not due until next week.

## 2018-04-26 ENCOUNTER — Other Ambulatory Visit: Payer: Self-pay | Admitting: Family Medicine

## 2018-04-26 DIAGNOSIS — K5903 Drug induced constipation: Secondary | ICD-10-CM

## 2018-04-26 MED ORDER — DOCUSATE SODIUM 100 MG PO CAPS: 100.0000 mg | ORAL_CAPSULE | Freq: Two times a day (BID) | ORAL | 0 refills | Status: DC

## 2018-04-27 ENCOUNTER — Telehealth: Payer: Self-pay

## 2018-04-27 NOTE — Telephone Encounter (Signed)
Patient needs a refill on Oxycodone 10mg  and she would like to know if she can get the OxyContin on the sam e schedule

## 2018-04-28 ENCOUNTER — Other Ambulatory Visit: Payer: Self-pay | Admitting: Family Medicine

## 2018-04-28 DIAGNOSIS — F1190 Opioid use, unspecified, uncomplicated: Secondary | ICD-10-CM

## 2018-04-28 DIAGNOSIS — D571 Sickle-cell disease without crisis: Secondary | ICD-10-CM

## 2018-04-28 DIAGNOSIS — G894 Chronic pain syndrome: Secondary | ICD-10-CM

## 2018-04-28 MED ORDER — OXYCODONE HCL 10 MG PO TABS: ORAL_TABLET | ORAL | 0 refills | Status: DC

## 2018-04-28 NOTE — Telephone Encounter (Signed)
Patient notified

## 2018-04-30 ENCOUNTER — Telehealth: Payer: Self-pay | Admitting: Family Medicine

## 2018-04-30 NOTE — Telephone Encounter (Signed)
Patient states that pharmacy is saying that she can get her Oxycodone 10mg  until Sunday 3/29 and she is out and doesn't want to go into the weekend without any medication.

## 2018-04-30 NOTE — Telephone Encounter (Signed)
Attempted to contact patient today to discuss medication management.

## 2018-05-04 ENCOUNTER — Other Ambulatory Visit: Payer: Self-pay | Admitting: Family Medicine

## 2018-05-04 DIAGNOSIS — F1190 Opioid use, unspecified, uncomplicated: Secondary | ICD-10-CM

## 2018-05-04 DIAGNOSIS — G894 Chronic pain syndrome: Secondary | ICD-10-CM

## 2018-05-05 ENCOUNTER — Other Ambulatory Visit: Payer: Self-pay | Admitting: Family Medicine

## 2018-05-05 ENCOUNTER — Telehealth: Payer: Self-pay

## 2018-05-05 DIAGNOSIS — F1190 Opioid use, unspecified, uncomplicated: Secondary | ICD-10-CM

## 2018-05-05 DIAGNOSIS — G894 Chronic pain syndrome: Secondary | ICD-10-CM

## 2018-05-05 MED ORDER — CYCLOBENZAPRINE HCL 10 MG PO TABS: 10.0000 mg | ORAL_TABLET | Freq: Three times a day (TID) | ORAL | 2 refills | Status: DC | PRN

## 2018-05-05 NOTE — Telephone Encounter (Signed)
Patient would like a refill on Flexeril.

## 2018-05-06 NOTE — Telephone Encounter (Signed)
Patient given message that prescription was ready and would only be refilled every 30 days.

## 2018-05-11 ENCOUNTER — Other Ambulatory Visit: Payer: Self-pay | Admitting: Family Medicine

## 2018-05-11 ENCOUNTER — Telehealth: Payer: Self-pay

## 2018-05-11 DIAGNOSIS — G894 Chronic pain syndrome: Secondary | ICD-10-CM

## 2018-05-11 DIAGNOSIS — D571 Sickle-cell disease without crisis: Secondary | ICD-10-CM

## 2018-05-11 DIAGNOSIS — F1190 Opioid use, unspecified, uncomplicated: Secondary | ICD-10-CM

## 2018-05-11 MED ORDER — OXYCODONE HCL ER 10 MG PO T12A: 10.0000 mg | EXTENDED_RELEASE_TABLET | Freq: Two times a day (BID) | ORAL | 0 refills | Status: DC

## 2018-05-11 MED ORDER — OXYCODONE HCL 10 MG PO TABS: ORAL_TABLET | ORAL | 0 refills | Status: DC

## 2018-05-11 NOTE — Telephone Encounter (Signed)
Patient needs a refill on Oxycodone and OxyContin. 

## 2018-05-12 ENCOUNTER — Telehealth: Payer: Self-pay

## 2018-05-12 NOTE — Telephone Encounter (Signed)
Patient insurance has changed to regular Medicaid instead of [employer].  Per Medicaid patient needs to try the [facility] ER, Tramadol, Morphine sulfate, Fentanyl Patch, Buprenorphine. We need to try these before they will cover the OxyContin.

## 2018-05-13 MED ORDER — OXYCODONE ER 13.5 MG PO C12A: 13.5000 mg | EXTENDED_RELEASE_CAPSULE | Freq: Two times a day (BID) | ORAL | 0 refills | Status: DC

## 2018-05-13 NOTE — Telephone Encounter (Signed)
Can you send the [facility]za to [facility] [address]. This is the only one that has it right now. They cancel the other one. I added the pharmacy

## 2018-05-13 NOTE — Telephone Encounter (Signed)
Patient notified that [facility] and will pick it up

## 2018-05-13 NOTE — Telephone Encounter (Signed)
Sent xtampza 13.5mg  q12H to pharm on file.

## 2018-05-13 NOTE — Addendum Note (Signed)
Addended by: Reatha Harps on: 05/13/2018 12:59 PM   Modules accepted: Orders

## 2018-05-14 ENCOUNTER — Other Ambulatory Visit: Payer: Self-pay | Admitting: Family Medicine

## 2018-05-14 DIAGNOSIS — D571 Sickle-cell disease without crisis: Secondary | ICD-10-CM

## 2018-05-14 DIAGNOSIS — G894 Chronic pain syndrome: Secondary | ICD-10-CM

## 2018-05-14 DIAGNOSIS — F1190 Opioid use, unspecified, uncomplicated: Secondary | ICD-10-CM

## 2018-05-14 MED ORDER — OXYCODONE ER 13.5 MG PO C12A: 13.5000 mg | EXTENDED_RELEASE_CAPSULE | Freq: Two times a day (BID) | ORAL | 0 refills | Status: DC

## 2018-05-14 MED ORDER — OXYCODONE HCL 10 MG PO TABS: ORAL_TABLET | ORAL | 0 refills | Status: DC

## 2018-05-14 NOTE — Progress Notes (Signed)
Patient states that pharmacy is out of medication and she would like for it to be transferred to another pharmacy. Rx for Oxy-IR transferred today from [facility], [place], [place], to [facility], [place], [place]. Patient is aware.

## 2018-05-17 ENCOUNTER — Telehealth: Payer: Self-pay

## 2018-05-17 NOTE — Telephone Encounter (Signed)
[facility] does not have enough Oxycodone in stock - please change to [facility].   Thanks

## 2018-05-17 NOTE — Telephone Encounter (Signed)
Stephanie Burch rep called to state patients prior authorization has been approved.  Patient needs to call [phone] Ext [phone] to arrange deliver.  They have attempted to contact patient without success.

## 2018-05-19 NOTE — Telephone Encounter (Signed)
Left a vm for patient

## 2018-05-21 NOTE — Telephone Encounter (Signed)
Left another detailed vm 

## 2018-05-24 NOTE — Telephone Encounter (Signed)
Contact patent several times and left a vm

## 2018-05-25 ENCOUNTER — Telehealth: Payer: Self-pay | Admitting: Family Medicine

## 2018-05-25 NOTE — Telephone Encounter (Signed)
Patient needs a refill on Oxycodone 10mg.  

## 2018-05-25 NOTE — Telephone Encounter (Signed)
Left a vm for patient to callback 

## 2018-05-26 ENCOUNTER — Other Ambulatory Visit: Payer: Self-pay | Admitting: Family Medicine

## 2018-05-26 DIAGNOSIS — D571 Sickle-cell disease without crisis: Secondary | ICD-10-CM

## 2018-05-26 DIAGNOSIS — G894 Chronic pain syndrome: Secondary | ICD-10-CM

## 2018-05-26 DIAGNOSIS — F1190 Opioid use, unspecified, uncomplicated: Secondary | ICD-10-CM

## 2018-05-26 MED ORDER — OXYCODONE HCL 10 MG PO TABS: ORAL_TABLET | ORAL | 0 refills | Status: DC

## 2018-05-26 NOTE — Telephone Encounter (Signed)
Patient notified and will pick up medication Sunday

## 2018-06-01 NOTE — Telephone Encounter (Signed)
Message sent to provider 

## 2018-06-08 ENCOUNTER — Telehealth: Payer: Self-pay

## 2018-06-08 ENCOUNTER — Other Ambulatory Visit: Payer: Self-pay | Admitting: Family Medicine

## 2018-06-08 DIAGNOSIS — F1190 Opioid use, unspecified, uncomplicated: Secondary | ICD-10-CM

## 2018-06-08 DIAGNOSIS — G894 Chronic pain syndrome: Secondary | ICD-10-CM

## 2018-06-08 DIAGNOSIS — D571 Sickle-cell disease without crisis: Secondary | ICD-10-CM

## 2018-06-08 MED ORDER — OXYCODONE ER 13.5 MG PO C12A: 13.5000 mg | EXTENDED_RELEASE_CAPSULE | Freq: Two times a day (BID) | ORAL | 0 refills | Status: DC

## 2018-06-08 MED ORDER — OXYCODONE HCL 10 MG PO TABS: ORAL_TABLET | ORAL | 0 refills | Status: DC

## 2018-06-08 NOTE — Telephone Encounter (Signed)
Left a vm letting patient know that her medication have been sent to pharmacy

## 2018-06-09 ENCOUNTER — Telehealth: Payer: Self-pay

## 2018-06-09 ENCOUNTER — Other Ambulatory Visit: Payer: Self-pay | Admitting: Family Medicine

## 2018-06-09 DIAGNOSIS — R110 Nausea: Secondary | ICD-10-CM

## 2018-06-09 DIAGNOSIS — F1190 Opioid use, unspecified, uncomplicated: Secondary | ICD-10-CM

## 2018-06-09 DIAGNOSIS — G894 Chronic pain syndrome: Secondary | ICD-10-CM

## 2018-06-09 MED ORDER — CYCLOBENZAPRINE HCL 10 MG PO TABS: 10.0000 mg | ORAL_TABLET | Freq: Three times a day (TID) | ORAL | 2 refills | Status: AC | PRN

## 2018-06-09 MED ORDER — ONDANSETRON HCL 4 MG PO TABS: 4.0000 mg | ORAL_TABLET | Freq: Three times a day (TID) | ORAL | 2 refills | Status: DC | PRN

## 2018-06-09 NOTE — Telephone Encounter (Signed)
Tried to contact patient not answer

## 2018-06-10 NOTE — Telephone Encounter (Signed)
Message sent to provider 

## 2018-06-11 NOTE — Telephone Encounter (Signed)
Left a vm for patient to callback 

## 2018-06-15 ENCOUNTER — Ambulatory Visit (INDEPENDENT_AMBULATORY_CARE_PROVIDER_SITE_OTHER): Payer: Medicaid Other | Admitting: Family Medicine

## 2018-06-15 ENCOUNTER — Other Ambulatory Visit: Payer: Self-pay

## 2018-06-15 DIAGNOSIS — F1190 Opioid use, unspecified, uncomplicated: Secondary | ICD-10-CM | POA: Diagnosis not present

## 2018-06-15 DIAGNOSIS — H5310 Unspecified subjective visual disturbances: Secondary | ICD-10-CM

## 2018-06-15 DIAGNOSIS — D571 Sickle-cell disease without crisis: Secondary | ICD-10-CM | POA: Diagnosis not present

## 2018-06-15 DIAGNOSIS — G894 Chronic pain syndrome: Secondary | ICD-10-CM | POA: Diagnosis not present

## 2018-06-15 DIAGNOSIS — Z09 Encounter for follow-up examination after completed treatment for conditions other than malignant neoplasm: Secondary | ICD-10-CM

## 2018-06-15 NOTE — Progress Notes (Signed)
Virtual Visit via Telephone Note  I connected with Stephanie Burch on 06/16/18 at  8:40 AM EDT by telephone and verified that I am speaking with the correct person using two identifiers.   I discussed the limitations, risks, security and privacy concerns of performing an evaluation and management service by telephone and the availability of in person appointments. I also discussed with the patient that there may be a patient responsible charge related to this service. The patient expressed understanding and agreed to proceed.   History of Present Illness:  Past Medical History:  Diagnosis Date  . Sickle cell anemia (HCC)    Current Outpatient Medications on File Prior to Visit  Medication Sig Dispense Refill  . acetaminophen (TYLENOL) 500 MG tablet Take 500 mg by mouth every 6 (six) hours as needed.    . cyclobenzaprine (FLEXERIL) 10 MG tablet Take 1 tablet (10 mg total) by mouth 3 (three) times daily as needed for up to 30 days for muscle spasms. 30 tablet 2  . docusate sodium (COLACE) 100 MG capsule Take 1 capsule (100 mg total) by mouth 2 (two) times daily. 10 capsule 0  . folic acid (FOLVITE) 1 MG tablet Take 1 tablet (1 mg total) by mouth daily. 30 tablet 11  . ibuprofen (ADVIL,MOTRIN) 800 MG tablet Take 1 tablet (800 mg total) by mouth every 8 (eight) hours as needed for headache, mild pain, moderate pain or cramping. 30 tablet 3  . ondansetron (ZOFRAN) 4 MG tablet Take 1 tablet (4 mg total) by mouth every 8 (eight) hours as needed for nausea or vomiting. 20 tablet 2  . oxyCODONE ER (XTAMPZA ER) 13.5 MG C12A Take 13.5 mg by mouth every 12 (twelve) hours for 30 days. 60 each 0  . Oxycodone HCl 10 MG TABS Take 2 tablets (total dose = 20 mg), every 4 hours as needed for pain. 180 tablet 0  . Vitamin D, Ergocalciferol, (DRISDOL) 50000 units CAPS capsule Take 1 capsule (50,000 Units total) by mouth every 7 (seven) days. 5 capsule 2  . voxelotor (OXBRYTA) 500 MG TABS tablet Take 1,500 mg by  mouth daily. For Sickle Cell Anemia    . hydroxyurea (HYDREA) 500 MG capsule Take 4 capsules (2,000 mg total) by mouth daily. May take with food to minimize GI side effects. (Patient not taking: Reported on 04/12/2018) 120 capsule 1   No current facility-administered medications on file prior to visit.     Current Status: Since her last office visit, she is doing well with no complaints. She states that she has pain in her arms, hips, and legs. She rates her pain today at 3/10. She has not had a hospital visit for Sickle Cell Crisis since 03/31/2018 where she treated and discharged the same day. She is currently taking all medications as prescribed and staying well hydrated. She reports occasional dizziness and headaches. She reports that she has began Mexicoxbryta the end of 05/2018. She reports right eye visual disturbance X 1 month now. She has not had trauma to her right eye. She has a history of surgery of her right eye. Her anxiety is moderate today, r/t COVID19 crisis.   She denies fevers, chills, fatigue, recent infections, weight loss, and night sweats. She has not had any visual changes, and falls. No chest pain, heart palpitations, cough and shortness of breath reported. No reports of GI problems such as diarrhea. She has no reports of blood in stools, dysuria and hematuria.     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 She will continue to take pain medications as prescribed; will continue to avoid extreme heat and cold; will continue to eat a healthy diet and drink at least 64 ounces of water daily; continue stool softener as needed; will avoid colds and flu; will continue to get plenty of sleep and rest; will continue to avoid high stressful situations and remain infection free; will continue Folic Acid 1 mg daily to avoid sickle cell crisis.   2. Chronic pain syndrome  3. Chronic, continuous  use of opioids  4. Subjective visual disturbance of right eye - Ambulatory referral to Ophthalmology   No orders of the defined types were placed in this encounter.   Orders Placed This Encounter  Procedures  . Ambulatory referral to Ophthalmology     Referral Orders     Ambulatory referral to Ophthalmology   [name],  MSN, FNP-C [facility] [address] [place], [place] [address] [phone]  Follow Up Instructions:  She will follow up in 2 months.     I discussed the assessment and treatment plan with the patient. The patient was provided an opportunity to ask questions and all were answered. The patient agreed with the plan and demonstrated an understanding of the instructions.   The patient was advised to call back or seek an in-person evaluation if the symptoms worsen or if the condition fails to improve as anticipated.  I provided 20 minutes of non-face-to-face time during this encounter.   Kallie Locks, FNP

## 2018-06-16 NOTE — Telephone Encounter (Signed)
Message sent to provider 

## 2018-06-22 ENCOUNTER — Telehealth: Payer: Self-pay

## 2018-06-22 ENCOUNTER — Other Ambulatory Visit: Payer: Self-pay | Admitting: Family Medicine

## 2018-06-22 DIAGNOSIS — M62838 Other muscle spasm: Secondary | ICD-10-CM

## 2018-06-22 DIAGNOSIS — F1190 Opioid use, unspecified, uncomplicated: Secondary | ICD-10-CM

## 2018-06-22 DIAGNOSIS — G894 Chronic pain syndrome: Secondary | ICD-10-CM

## 2018-06-22 DIAGNOSIS — D571 Sickle-cell disease without crisis: Secondary | ICD-10-CM

## 2018-06-22 MED ORDER — OXYCODONE HCL 10 MG PO TABS: ORAL_TABLET | ORAL | 0 refills | Status: DC

## 2018-06-22 MED ORDER — TIZANIDINE HCL 4 MG PO TABS: ORAL_TABLET | ORAL | 2 refills | Status: DC

## 2018-06-22 NOTE — Telephone Encounter (Signed)
Patient notified

## 2018-06-30 ENCOUNTER — Other Ambulatory Visit: Payer: Self-pay | Admitting: Family Medicine

## 2018-06-30 DIAGNOSIS — M62838 Other muscle spasm: Secondary | ICD-10-CM

## 2018-07-06 ENCOUNTER — Telehealth: Payer: Self-pay

## 2018-07-06 ENCOUNTER — Other Ambulatory Visit: Payer: Self-pay | Admitting: Family Medicine

## 2018-07-06 DIAGNOSIS — D571 Sickle-cell disease without crisis: Secondary | ICD-10-CM

## 2018-07-06 DIAGNOSIS — F1190 Opioid use, unspecified, uncomplicated: Secondary | ICD-10-CM

## 2018-07-06 DIAGNOSIS — G894 Chronic pain syndrome: Secondary | ICD-10-CM

## 2018-07-06 DIAGNOSIS — R110 Nausea: Secondary | ICD-10-CM

## 2018-07-06 MED ORDER — ONDANSETRON HCL 4 MG PO TABS: 4.0000 mg | ORAL_TABLET | Freq: Three times a day (TID) | ORAL | 2 refills | Status: DC | PRN

## 2018-07-06 MED ORDER — OXYCODONE HCL 10 MG PO TABS: ORAL_TABLET | ORAL | 0 refills | Status: DC

## 2018-07-06 MED ORDER — OXYCODONE ER 13.5 MG PO C12A: 13.5000 mg | EXTENDED_RELEASE_CAPSULE | Freq: Two times a day (BID) | ORAL | 0 refills | Status: DC

## 2018-07-07 NOTE — Telephone Encounter (Signed)
Left a detail vm for patient regarding medication  

## 2018-07-14 ENCOUNTER — Telehealth: Payer: Self-pay

## 2018-07-14 ENCOUNTER — Other Ambulatory Visit: Payer: Self-pay

## 2018-07-14 DIAGNOSIS — Z0000 Encounter for general adult medical examination without abnormal findings: Secondary | ICD-10-CM

## 2018-07-14 DIAGNOSIS — D571 Sickle-cell disease without crisis: Secondary | ICD-10-CM

## 2018-07-14 MED ORDER — FOLIC ACID 1 MG PO TABS: 1.0000 mg | ORAL_TABLET | Freq: Every day | ORAL | 11 refills | Status: DC

## 2018-07-14 NOTE — Telephone Encounter (Signed)
Medication sent to pharmacy  

## 2018-07-15 ENCOUNTER — Telehealth: Payer: Self-pay

## 2018-07-15 NOTE — Telephone Encounter (Signed)
Patient states that she is vomiting her medications up every time she takes them. Patient states that this has been happening for the last 3 days. Patient also is not sure if she is getting the medication if she it spitting it right back up. Patient was advise to try taking the Zofran to see if that will help. Patient is aware that you are out of office today.

## 2018-07-16 NOTE — Telephone Encounter (Signed)
Patient states that taking the Zofran with her pain medication has helped and she didn't throw anything up. She would like refill of the Zofran but would like a increased of QTY.

## 2018-07-17 ENCOUNTER — Other Ambulatory Visit: Payer: Self-pay | Admitting: Family Medicine

## 2018-07-17 DIAGNOSIS — R110 Nausea: Secondary | ICD-10-CM

## 2018-07-17 MED ORDER — ONDANSETRON HCL 4 MG PO TABS: 4.0000 mg | ORAL_TABLET | Freq: Three times a day (TID) | ORAL | 2 refills | Status: DC | PRN

## 2018-07-19 NOTE — Telephone Encounter (Signed)
Left a detail vm  

## 2018-07-22 ENCOUNTER — Other Ambulatory Visit: Payer: Self-pay | Admitting: Family Medicine

## 2018-07-22 DIAGNOSIS — M62838 Other muscle spasm: Secondary | ICD-10-CM

## 2018-07-24 ENCOUNTER — Other Ambulatory Visit: Payer: Self-pay | Admitting: Family Medicine

## 2018-07-24 DIAGNOSIS — M62838 Other muscle spasm: Secondary | ICD-10-CM

## 2018-07-26 ENCOUNTER — Telehealth: Payer: Self-pay

## 2018-07-26 ENCOUNTER — Other Ambulatory Visit: Payer: Self-pay

## 2018-07-26 ENCOUNTER — Other Ambulatory Visit: Payer: Self-pay | Admitting: Family Medicine

## 2018-07-26 DIAGNOSIS — F1190 Opioid use, unspecified, uncomplicated: Secondary | ICD-10-CM

## 2018-07-26 DIAGNOSIS — G894 Chronic pain syndrome: Secondary | ICD-10-CM

## 2018-07-26 DIAGNOSIS — D571 Sickle-cell disease without crisis: Secondary | ICD-10-CM

## 2018-07-26 MED ORDER — OXYCODONE HCL 10 MG PO TABS: ORAL_TABLET | ORAL | 0 refills | Status: DC

## 2018-07-26 NOTE — Telephone Encounter (Signed)
Patient needs a refill on Oxycodone 10 mg. Patient states that she called and left a vm on 07/20/2018 but I didn't see request in the system

## 2018-07-29 ENCOUNTER — Telehealth: Payer: Self-pay

## 2018-07-29 DIAGNOSIS — M62838 Other muscle spasm: Secondary | ICD-10-CM

## 2018-07-29 MED ORDER — TIZANIDINE HCL 4 MG PO TABS: ORAL_TABLET | ORAL | 0 refills | Status: DC

## 2018-07-29 NOTE — Telephone Encounter (Signed)
One refilled provided until patient comes in for appointment

## 2018-08-02 ENCOUNTER — Telehealth: Payer: Self-pay

## 2018-08-02 ENCOUNTER — Other Ambulatory Visit: Payer: Self-pay | Admitting: Family Medicine

## 2018-08-02 DIAGNOSIS — D571 Sickle-cell disease without crisis: Secondary | ICD-10-CM

## 2018-08-02 DIAGNOSIS — F1190 Opioid use, unspecified, uncomplicated: Secondary | ICD-10-CM

## 2018-08-02 DIAGNOSIS — G894 Chronic pain syndrome: Secondary | ICD-10-CM

## 2018-08-02 MED ORDER — XTAMPZA ER 13.5 MG PO C12A: 13.5000 mg | EXTENDED_RELEASE_CAPSULE | Freq: Two times a day (BID) | ORAL | 0 refills | Status: DC

## 2018-08-02 MED ORDER — OXYCODONE HCL 10 MG PO TABS: ORAL_TABLET | ORAL | 0 refills | Status: DC

## 2018-08-02 NOTE — Telephone Encounter (Signed)
Patient needs a refill on Oxycodone and Xtampza. Patient states that she is leaving to go out of town Thursday and will be gone for a week.  Patient would like to pickup a few days early so she want run out.

## 2018-08-03 NOTE — Telephone Encounter (Signed)
Patient advised of message below and state that she is going out of state and she want be back until 07/13/2018.

## 2018-08-03 NOTE — Telephone Encounter (Signed)
Left a vm for patient to callback 

## 2018-08-04 ENCOUNTER — Telehealth: Payer: Self-pay

## 2018-08-04 NOTE — Telephone Encounter (Signed)
Patient notified and states that she is not sure if she will even go out of town because she is not feeling well

## 2018-08-05 ENCOUNTER — Other Ambulatory Visit: Payer: Self-pay | Admitting: Family Medicine

## 2018-08-05 NOTE — Telephone Encounter (Signed)
Patient notified

## 2018-08-08 ENCOUNTER — Other Ambulatory Visit: Payer: Self-pay | Admitting: Family Medicine

## 2018-08-08 DIAGNOSIS — M62838 Other muscle spasm: Secondary | ICD-10-CM

## 2018-08-16 ENCOUNTER — Ambulatory Visit: Payer: Medicaid Other | Admitting: Family Medicine

## 2018-08-17 ENCOUNTER — Telehealth: Payer: Self-pay

## 2018-08-19 ENCOUNTER — Other Ambulatory Visit: Payer: Self-pay | Admitting: Family Medicine

## 2018-08-19 ENCOUNTER — Telehealth: Payer: Self-pay

## 2018-08-19 DIAGNOSIS — G894 Chronic pain syndrome: Secondary | ICD-10-CM

## 2018-08-19 DIAGNOSIS — M62838 Other muscle spasm: Secondary | ICD-10-CM

## 2018-08-19 DIAGNOSIS — F1190 Opioid use, unspecified, uncomplicated: Secondary | ICD-10-CM

## 2018-08-19 DIAGNOSIS — D571 Sickle-cell disease without crisis: Secondary | ICD-10-CM

## 2018-08-19 MED ORDER — OXYCODONE HCL 10 MG PO TABS: ORAL_TABLET | ORAL | 0 refills | Status: DC

## 2018-08-19 MED ORDER — TIZANIDINE HCL 4 MG PO TABS: ORAL_TABLET | ORAL | 0 refills | Status: DC

## 2018-08-19 NOTE — Telephone Encounter (Signed)
Spoke with patient regarding her medication and when they are due for refill

## 2018-08-20 NOTE — Telephone Encounter (Signed)
Patient notified

## 2018-08-20 NOTE — Telephone Encounter (Signed)
Duplicate message spoke with patient.  

## 2018-08-24 ENCOUNTER — Telehealth: Payer: Self-pay | Admitting: Family Medicine

## 2018-08-24 ENCOUNTER — Ambulatory Visit: Payer: Medicaid Other | Admitting: Family Medicine

## 2018-08-24 NOTE — Telephone Encounter (Signed)
PA has submitted to [facility] and waiting for approval

## 2018-08-24 NOTE — Telephone Encounter (Signed)
Patient notified that medication has been approved and pharmacy is getting it ready

## 2018-08-25 ENCOUNTER — Ambulatory Visit (INDEPENDENT_AMBULATORY_CARE_PROVIDER_SITE_OTHER): Payer: Medicaid Other | Admitting: Family Medicine

## 2018-08-25 ENCOUNTER — Other Ambulatory Visit: Payer: Self-pay

## 2018-08-25 ENCOUNTER — Encounter: Payer: Self-pay | Admitting: Family Medicine

## 2018-08-25 VITALS — BP 134/88 | HR 96 | Temp 98.1°F | Ht 65.0 in | Wt 273.0 lb

## 2018-08-25 DIAGNOSIS — M62838 Other muscle spasm: Secondary | ICD-10-CM | POA: Diagnosis not present

## 2018-08-25 DIAGNOSIS — D571 Sickle-cell disease without crisis: Secondary | ICD-10-CM | POA: Diagnosis not present

## 2018-08-25 DIAGNOSIS — F1190 Opioid use, unspecified, uncomplicated: Secondary | ICD-10-CM | POA: Diagnosis not present

## 2018-08-25 DIAGNOSIS — Z09 Encounter for follow-up examination after completed treatment for conditions other than malignant neoplasm: Secondary | ICD-10-CM

## 2018-08-25 DIAGNOSIS — H5310 Unspecified subjective visual disturbances: Secondary | ICD-10-CM | POA: Insufficient documentation

## 2018-08-25 DIAGNOSIS — G894 Chronic pain syndrome: Secondary | ICD-10-CM | POA: Diagnosis not present

## 2018-08-25 LAB — POCT URINALYSIS DIP (MANUAL ENTRY)
Bilirubin, UA: NEGATIVE
Blood, UA: NEGATIVE
Glucose, UA: NEGATIVE mg/dL
Ketones, POC UA: NEGATIVE mg/dL
Leukocytes, UA: NEGATIVE
Nitrite, UA: NEGATIVE
Protein Ur, POC: NEGATIVE mg/dL
Spec Grav, UA: 1.02 (ref 1.010–1.025)
Urobilinogen, UA: 1 E.U./dL
pH, UA: 5.5 (ref 5.0–8.0)

## 2018-08-25 NOTE — Progress Notes (Signed)
[facility]   Established Patient Office Visit  Subjective:  Patient ID: Stephanie Burch, female    DOB: 05-04-1980  Age: 38 y.o. MRN: [id number]  CC:  Chief Complaint  Patient presents with  . Follow-up    sickle cell    HPI Stephanie Burch is a 38 year old female who presents for follow up today.   Past Medical History:  Diagnosis Date  . Sickle cell anemia (HCC)    Current Status: Since her last office visit, she is doing well with no complaints states that she has pain in her arms, hips, and legs; occasionally in left flank area. She rates her pain today at 3/10. She has not had a [facility] visit for Sickle Cell Crisis since 03/31/2018 where she treated and discharged the same day. She is currently taking all medications as prescribed and staying well hydrated. She continues on Oxybryta as prescribed. She reports occasional nausea, constipation, dizziness and headaches. Her anxiety is mild today. She denies suicidal ideations, homicidal ideations, or auditory hallucinations.  She denies fevers, chills, fatigue, recent infections, weight loss, and night sweats. She has not had any visual changes, and falls. No chest pain, heart palpitations, cough and shortness of breath reported. No reports of GI problems such as vomiting, diarrhea, and constipation. She has no reports of blood in stools, dysuria and hematuria. She denies pain today.   History reviewed. No pertinent surgical history.  Family History  Problem Relation Age of Onset  . Hypertension Mother   . Sickle cell trait Mother   . Diabetes Father   . Sickle cell trait Father     Social History   Socioeconomic History  . Marital status: Single    Spouse name: Not on file  . Number of children: Not on file  . Years of education: Not on file  . Highest education level: Not on file  Occupational History  . Not on file  Social Needs  . Financial resource  strain: Not on file  . Food insecurity    Worry: Not on file    Inability: Not on file  . Transportation needs    Medical: Not on file    Non-medical: Not on file  Tobacco Use  . Smoking status: Never Smoker  . Smokeless tobacco: Never Used  Substance and Sexual Activity  . Alcohol use: Never    Frequency: Never  . Drug use: Never  . Sexual activity: Yes    Birth control/protection: None  Lifestyle  . Physical activity    Days per week: Not on file    Minutes per session: Not on file  . Stress: Not on file  Relationships  . Social Musicianconnections    Talks on phone: Not on file    Gets together: Not on file    Attends religious service: Not on file    Active member of club or organization: Not on file    Attends meetings of clubs or organizations: Not on file    Relationship status: Not on file  . Intimate partner violence    Fear of current or ex partner: Not on file    Emotionally abused: Not on file    Physically abused: Not on file    Forced sexual activity: Not on file  Other Topics Concern  . Not on file  Social History Narrative  . Not on file    Outpatient Medications Prior to Visit  Medication Sig  Dispense Refill  . acetaminophen (TYLENOL) 500 MG tablet Take 500 mg by mouth every 6 (six) hours as needed.    . docusate sodium (COLACE) 100 MG capsule Take 1 capsule (100 mg total) by mouth 2 (two) times daily. 10 capsule 0  . folic acid (FOLVITE) 1 MG tablet Take 1 tablet (1 mg total) by mouth daily. 30 tablet 11  . ibuprofen (ADVIL,MOTRIN) 800 MG tablet Take 1 tablet (800 mg total) by mouth every 8 (eight) hours as needed for headache, mild pain, moderate pain or cramping. 30 tablet 3  . ondansetron (ZOFRAN) 4 MG tablet Take 1 tablet (4 mg total) by mouth every 8 (eight) hours as needed for nausea or vomiting. 30 tablet 2  . oxyCODONE ER (XTAMPZA ER) 13.5 MG C12A Take 13.5 mg by mouth every 12 (twelve) hours for 30 days. 60 capsule 0  . Oxycodone HCl 10 MG TABS Take  2 tablets (total dose = 20 mg), every 4 hours as needed for pain. 180 tablet 0  . [START ON 08/27/2018] tiZANidine (ZANAFLEX) 4 MG tablet Take 1 tablet every 4 hours as needed. 90 tablet 0  . voxelotor (OXBRYTA) 500 MG TABS tablet Take 1,500 mg by mouth daily. For Sickle Cell Anemia    . hydroxyurea (HYDREA) 500 MG capsule Take 4 capsules (2,000 mg total) by mouth daily. May take with food to minimize GI side effects. (Patient not taking: Reported on 08/25/2018) 120 capsule 1  . Vitamin D, Ergocalciferol, (DRISDOL) 50000 units CAPS capsule Take 1 capsule (50,000 Units total) by mouth every 7 (seven) days. (Patient not taking: Reported on 08/25/2018) 5 capsule 2   No facility-administered medications prior to visit.     No Known Allergies  ROS Review of Systems  Constitutional: Negative.   HENT: Negative.   Eyes: Negative.   Respiratory: Negative.   Cardiovascular: Negative.   Gastrointestinal: Positive for constipation (occasional ) and nausea (occasional).  Endocrine: Negative.   Genitourinary: Negative.   Musculoskeletal: Positive for arthralgias (generalized joint pain) and back pain (chronic).  Skin: Negative.   Allergic/Immunologic: Negative.   Neurological: Positive for dizziness (occasional) and headaches (occasional).  Hematological: Negative.   Psychiatric/Behavioral: Negative.    Objective:    Physical Exam  Constitutional: She is oriented to person, place, and time. She appears well-developed and well-nourished.  HENT:  Head: Normocephalic and atraumatic.  Eyes: Conjunctivae are normal.  Neck: Normal range of motion. Neck supple.  Cardiovascular: Normal rate, regular rhythm, normal heart sounds and intact distal pulses.  Pulmonary/Chest: Effort normal and breath sounds normal.  Abdominal: Soft. Bowel sounds are normal.  Musculoskeletal: Normal range of motion.  Neurological: She is alert and oriented to person, place, and time. She has normal reflexes.  Skin: Skin is  warm and dry.  Psychiatric: She has a normal mood and affect. Her behavior is normal. Judgment and thought content normal.  Nursing note and vitals reviewed.   BP 134/88   Pulse 96   Temp 98.1 F (36.7 C) (Oral)   Ht 5\' 5"  (1.651 m)   Wt 273 lb (123.8 kg)   LMP 08/12/2018   SpO2 100%   BMI 45.43 kg/m  Wt Readings from Last 3 Encounters:  08/25/18 273 lb (123.8 kg)  04/12/18 281 lb (127.5 kg)  03/17/18 271 lb 4.8 oz (123.1 kg)     Health Maintenance Due  Topic Date Due  . PAP SMEAR-Modifier  01/13/2004    There are no preventive care reminders to display for  this patient.  Lab Results  Component Value Date   TSH 0.862 09/11/2017   Lab Results  Component Value Date   WBC 5.5 03/31/2018   HGB 11.3 (L) 03/31/2018   HCT 33.7 (L) 03/31/2018   MCV 69.6 (L) 03/31/2018   PLT 238 03/31/2018   Lab Results  Component Value Date   NA 140 03/31/2018   K 3.8 03/31/2018   CO2 24 03/31/2018   GLUCOSE 98 03/31/2018   BUN 7 03/31/2018   CREATININE 0.64 03/31/2018   BILITOT 1.0 03/31/2018   ALKPHOS 73 03/31/2018   AST 14 (L) 03/31/2018   ALT 16 03/31/2018   PROT 7.2 03/31/2018   ALBUMIN 3.8 03/31/2018   CALCIUM 9.0 03/31/2018   ANIONGAP 6 03/31/2018   Lab Results  Component Value Date   CHOL 92 (L) 09/11/2017   Lab Results  Component Value Date   HDL 45 09/11/2017   Lab Results  Component Value Date   LDLCALC 36 09/11/2017   Lab Results  Component Value Date   TRIG 53 09/11/2017   Lab Results  Component Value Date   CHOLHDL 2.0 09/11/2017   Lab Results  Component Value Date   HGBA1C 4.1 09/11/2017   Assessment & Plan:   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 She will continue to take pain medications as prescribed; will continue to avoid extreme heat and cold; will continue to eat a healthy diet and drink at least 64 ounces of water daily; continue stool softener as needed; will avoid colds and flu; will continue to get plenty  of sleep and rest; will continue to avoid high stressful situations and remain infection free; will continue Folic Acid 1 mg daily to avoid sickle cell crisis.   2. Chronic, continuous use of opioids  3. Chronic pain syndrome  4. Muscle spasms of both lower extremities Stable today. She will continue Zanaflex as prescribed.   5. Subjective visual disturbance of right eye Resolved.   6. Follow up Follow up in 2 months.  - POCT urinalysis dipstick  No orders of the defined types were placed in this encounter.   Orders Placed This Encounter  Procedures  . POCT urinalysis dipstick    Referral Orders  No referral(s) requested today    Raliegh IpNatalie Marveen Donlon,  MSN, FNP-BC [facility] [address]  [place], [place] [id number] [phone] fax    Problem List Items Addressed This Visit      Other   Chronic pain syndrome   Chronic, continuous use of opioids   Hb-SS disease without crisis [facility](HCC)    Other Visit Diagnoses    Follow up    -  Primary   Relevant Orders   POCT urinalysis dipstick (Completed)   Muscle spasms of both lower extremities       Subjective visual disturbance of right eye          No orders of the defined types were placed in this encounter.   Follow-up: Return in about 2 months (around 10/26/2018).    Kallie LocksNatalie M Bellami Farrelly, FNP

## 2018-08-30 ENCOUNTER — Other Ambulatory Visit: Payer: Self-pay | Admitting: Family Medicine

## 2018-08-30 ENCOUNTER — Telehealth: Payer: Self-pay

## 2018-08-30 DIAGNOSIS — M62838 Other muscle spasm: Secondary | ICD-10-CM

## 2018-08-30 MED ORDER — TIZANIDINE HCL 4 MG PO TABS: ORAL_TABLET | ORAL | 0 refills | Status: DC

## 2018-08-30 NOTE — Telephone Encounter (Signed)
Patient states that when she picked up her Oxycodone it was 17 pills short. Patient states that she didn't count pills until she got home. [facility] Patient states that she will needs her script a day sooner because she will be out because of the shortage in pills. Spoke with Joann from [facility] and she states the pills were correct and were counted several times. Pharmacy states that patient has tried this several times. They didn't receive the script for the Tizanidine so I will resend it.

## 2018-09-01 ENCOUNTER — Other Ambulatory Visit: Payer: Self-pay

## 2018-09-01 ENCOUNTER — Telehealth (HOSPITAL_COMMUNITY): Payer: Self-pay | Admitting: General Practice

## 2018-09-01 DIAGNOSIS — Z6841 Body Mass Index (BMI) 40.0 and over, adult: Secondary | ICD-10-CM

## 2018-09-01 DIAGNOSIS — Z833 Family history of diabetes mellitus: Secondary | ICD-10-CM

## 2018-09-01 DIAGNOSIS — D638 Anemia in other chronic diseases classified elsewhere: Secondary | ICD-10-CM | POA: Diagnosis present

## 2018-09-01 DIAGNOSIS — G894 Chronic pain syndrome: Secondary | ICD-10-CM | POA: Diagnosis present

## 2018-09-01 DIAGNOSIS — Z20828 Contact with and (suspected) exposure to other viral communicable diseases: Secondary | ICD-10-CM | POA: Diagnosis present

## 2018-09-01 DIAGNOSIS — Z8249 Family history of ischemic heart disease and other diseases of the circulatory system: Secondary | ICD-10-CM

## 2018-09-01 DIAGNOSIS — D5700 Hb-SS disease with crisis, unspecified: Secondary | ICD-10-CM | POA: Diagnosis not present

## 2018-09-01 DIAGNOSIS — M62838 Other muscle spasm: Secondary | ICD-10-CM | POA: Diagnosis present

## 2018-09-01 DIAGNOSIS — Z832 Family history of diseases of the blood and blood-forming organs and certain disorders involving the immune mechanism: Secondary | ICD-10-CM

## 2018-09-01 DIAGNOSIS — Z03818 Encounter for observation for suspected exposure to other biological agents ruled out: Secondary | ICD-10-CM | POA: Diagnosis not present

## 2018-09-01 NOTE — Telephone Encounter (Signed)
Patient called requesting to come to the day hospital. Patient informed, the day hospital is full today. Patient told to go to the ER or call back tomorrow morning. Patient verbalized understanding. Provider notified.

## 2018-09-02 ENCOUNTER — Other Ambulatory Visit: Payer: Self-pay

## 2018-09-02 ENCOUNTER — Telehealth (HOSPITAL_COMMUNITY): Payer: Self-pay | Admitting: General Practice

## 2018-09-02 ENCOUNTER — Encounter (HOSPITAL_COMMUNITY): Payer: Self-pay | Admitting: Emergency Medicine

## 2018-09-02 ENCOUNTER — Inpatient Hospital Stay (HOSPITAL_COMMUNITY)
Admission: EM | Admit: 2018-09-02 | Discharge: 2018-09-06 | DRG: 812 | Disposition: A | Discharge status: Home / Self Care | Payer: Medicaid Other | Attending: Internal Medicine | Admitting: Internal Medicine

## 2018-09-02 DIAGNOSIS — G894 Chronic pain syndrome: Secondary | ICD-10-CM | POA: Diagnosis present

## 2018-09-02 DIAGNOSIS — Z6841 Body Mass Index (BMI) 40.0 and over, adult: Secondary | ICD-10-CM | POA: Diagnosis not present

## 2018-09-02 DIAGNOSIS — D5700 Hb-SS disease with crisis, unspecified: Secondary | ICD-10-CM | POA: Diagnosis present

## 2018-09-02 DIAGNOSIS — Z20828 Contact with and (suspected) exposure to other viral communicable diseases: Secondary | ICD-10-CM | POA: Diagnosis present

## 2018-09-02 DIAGNOSIS — Z8249 Family history of ischemic heart disease and other diseases of the circulatory system: Secondary | ICD-10-CM | POA: Diagnosis not present

## 2018-09-02 DIAGNOSIS — Z03818 Encounter for observation for suspected exposure to other biological agents ruled out: Secondary | ICD-10-CM | POA: Diagnosis not present

## 2018-09-02 DIAGNOSIS — Z833 Family history of diabetes mellitus: Secondary | ICD-10-CM | POA: Diagnosis not present

## 2018-09-02 DIAGNOSIS — M62838 Other muscle spasm: Secondary | ICD-10-CM | POA: Diagnosis present

## 2018-09-02 DIAGNOSIS — D638 Anemia in other chronic diseases classified elsewhere: Secondary | ICD-10-CM | POA: Diagnosis present

## 2018-09-02 DIAGNOSIS — Z832 Family history of diseases of the blood and blood-forming organs and certain disorders involving the immune mechanism: Secondary | ICD-10-CM | POA: Diagnosis not present

## 2018-09-02 LAB — CBC WITH DIFFERENTIAL/PLATELET
Abs Immature Granulocytes: 0.02 10*3/uL (ref 0.00–0.07)
Basophils Absolute: 0 10*3/uL (ref 0.0–0.1)
Basophils Relative: 0 %
Eosinophils Absolute: 0.1 10*3/uL (ref 0.0–0.5)
Eosinophils Relative: 1 %
HCT: 36.8 % (ref 36.0–46.0)
Hemoglobin: 12.4 g/dL (ref 12.0–15.0)
Immature Granulocytes: 0 %
Lymphocytes Relative: 29 %
Lymphs Abs: 2.4 10*3/uL (ref 0.7–4.0)
MCH: 24.8 pg — ABNORMAL LOW (ref 26.0–34.0)
MCHC: 33.7 g/dL (ref 30.0–36.0)
MCV: 73.5 fL — ABNORMAL LOW (ref 80.0–100.0)
Monocytes Absolute: 0.6 10*3/uL (ref 0.1–1.0)
Monocytes Relative: 7 %
Neutro Abs: 5.2 10*3/uL (ref 1.7–7.7)
Neutrophils Relative %: 63 %
Platelets: 284 10*3/uL (ref 150–400)
RBC: 5.01 MIL/uL (ref 3.87–5.11)
RDW: 16.6 % — ABNORMAL HIGH (ref 11.5–15.5)
WBC: 8.3 10*3/uL (ref 4.0–10.5)
nRBC: 0.2 % (ref 0.0–0.2)

## 2018-09-02 LAB — SARS CORONAVIRUS 2 BY RT PCR (HOSPITAL ORDER, PERFORMED IN ~~LOC~~ HOSPITAL LAB): SARS Coronavirus 2: NEGATIVE

## 2018-09-02 LAB — COMPREHENSIVE METABOLIC PANEL
ALT: 15 U/L (ref 0–44)
AST: 11 U/L — ABNORMAL LOW (ref 15–41)
Albumin: 4 g/dL (ref 3.5–5.0)
Alkaline Phosphatase: 53 U/L (ref 38–126)
Anion gap: 8 (ref 5–15)
BUN: 10 mg/dL (ref 6–20)
CO2: 27 mmol/L (ref 22–32)
Calcium: 9.7 mg/dL (ref 8.9–10.3)
Chloride: 108 mmol/L (ref 98–111)
Creatinine, Ser: 0.84 mg/dL (ref 0.44–1.00)
GFR calc Af Amer: >60 mL/min (ref >60)
GFR calc non Af Amer: >60 mL/min (ref >60)
Glucose, Bld: 100 mg/dL — ABNORMAL HIGH (ref 70–99)
Potassium: 4 mmol/L (ref 3.5–5.1)
Sodium: 143 mmol/L (ref 135–145)
Total Bilirubin: 0.9 mg/dL (ref 0.3–1.2)
Total Protein: 7.2 g/dL (ref 6.5–8.1)

## 2018-09-02 LAB — RETICULOCYTES
Immature Retic Fract: 27.8 % — ABNORMAL HIGH (ref 2.3–15.9)
RBC.: 5.01 MIL/uL (ref 3.87–5.11)
Retic Count, Absolute: 204.4 10*3/uL — ABNORMAL HIGH (ref 19.0–186.0)
Retic Ct Pct: 4.1 % — ABNORMAL HIGH (ref 0.4–3.1)

## 2018-09-02 MED ORDER — HYDROMORPHONE HCL 2 MG/ML IJ SOLN: 2.0000 mg | INTRAMUSCULAR | Status: AC

## 2018-09-02 MED ORDER — OXYCODONE HCL ER 10 MG PO T12A
10.0000 mg | EXTENDED_RELEASE_TABLET | Freq: Two times a day (BID) | ORAL | Status: DC
  Administered 2018-09-02 – 2018-09-06 (×9): 10 mg via ORAL
  Filled 2018-09-02 (×10): qty 1

## 2018-09-02 MED ORDER — TIZANIDINE HCL 4 MG PO TABS: 4.0000 mg | ORAL_TABLET | ORAL | Status: DC | PRN

## 2018-09-02 MED ORDER — HYDROMORPHONE HCL 2 MG/ML IJ SOLN
2.0000 mg | INTRAMUSCULAR | Status: AC
  Administered 2018-09-02: 2 mg via INTRAVENOUS
  Filled 2018-09-02: qty 1

## 2018-09-02 MED ORDER — FOLIC ACID 1 MG PO TABS
1.0000 mg | ORAL_TABLET | Freq: Every day | ORAL | Status: DC
  Administered 2018-09-02 – 2018-09-06 (×5): 1 mg via ORAL
  Filled 2018-09-02 (×5): qty 1

## 2018-09-02 MED ORDER — HYDROMORPHONE 1 MG/ML IV SOLN
INTRAVENOUS | Status: DC
  Administered 2018-09-02: 20:00:00 via INTRAVENOUS
  Administered 2018-09-03: 6 mg via INTRAVENOUS
  Administered 2018-09-03: 4 mg via INTRAVENOUS
  Administered 2018-09-03: 0.75 mg via INTRAVENOUS
  Administered 2018-09-03: 30 mg via INTRAVENOUS
  Administered 2018-09-03: 4.5 mg via INTRAVENOUS
  Administered 2018-09-03: 5.25 mg via INTRAVENOUS
  Administered 2018-09-03: 9 mg via INTRAVENOUS
  Administered 2018-09-04: 0.75 mg via INTRAVENOUS
  Administered 2018-09-04: 9 mg via INTRAVENOUS
  Administered 2018-09-04: 6 mg via INTRAVENOUS
  Administered 2018-09-04: 9 mg via INTRAVENOUS
  Administered 2018-09-04: 30 mg via INTRAVENOUS
  Administered 2018-09-04: 4.5 mg via INTRAVENOUS
  Administered 2018-09-04: 9 mL via INTRAVENOUS
  Administered 2018-09-05: 0.75 mL via INTRAVENOUS
  Administered 2018-09-05: 30 mg via INTRAVENOUS
  Administered 2018-09-05: 12.75 mg via INTRAVENOUS
  Administered 2018-09-05: 20:00:00 via INTRAVENOUS
  Administered 2018-09-05: 1.5 mL via INTRAVENOUS
  Administered 2018-09-05: 9.75 mg via INTRAVENOUS
  Administered 2018-09-05: 4.5 mL via INTRAVENOUS
  Administered 2018-09-06: 10.5 mg via INTRAVENOUS
  Administered 2018-09-06: 3.75 mg via INTRAVENOUS
  Administered 2018-09-06: 30 mg via INTRAVENOUS
  Filled 2018-09-02 (×24): qty 30

## 2018-09-02 MED ORDER — SODIUM CHLORIDE 0.9 % IV SOLN
25.0000 mg | INTRAVENOUS | Status: DC | PRN
  Filled 2018-09-02: qty 0.5

## 2018-09-02 MED ORDER — DIPHENHYDRAMINE HCL 25 MG PO CAPS: 25.0000 mg | ORAL_CAPSULE | ORAL | Status: DC | PRN

## 2018-09-02 MED ORDER — SENNOSIDES-DOCUSATE SODIUM 8.6-50 MG PO TABS
1.0000 | ORAL_TABLET | Freq: Two times a day (BID) | ORAL | Status: DC
  Administered 2018-09-02 – 2018-09-06 (×8): 1 via ORAL
  Filled 2018-09-02 (×8): qty 1

## 2018-09-02 MED ORDER — KETOROLAC TROMETHAMINE 15 MG/ML IJ SOLN
15.0000 mg | Freq: Four times a day (QID) | INTRAMUSCULAR | Status: DC
  Administered 2018-09-02 – 2018-09-06 (×18): 15 mg via INTRAVENOUS
  Filled 2018-09-02 (×18): qty 1

## 2018-09-02 MED ORDER — NALOXONE HCL 0.4 MG/ML IJ SOLN: 0.4000 mg | INTRAMUSCULAR | Status: DC | PRN

## 2018-09-02 MED ORDER — ONDANSETRON HCL 4 MG/2ML IJ SOLN
4.0000 mg | INTRAMUSCULAR | Status: DC | PRN
  Administered 2018-09-02: 4 mg via INTRAVENOUS
  Filled 2018-09-02: qty 2

## 2018-09-02 MED ORDER — ENOXAPARIN SODIUM 40 MG/0.4ML ~~LOC~~ SOLN
40.0000 mg | SUBCUTANEOUS | Status: DC
  Administered 2018-09-02: 40 mg via SUBCUTANEOUS
  Filled 2018-09-02 (×3): qty 0.4

## 2018-09-02 MED ORDER — SODIUM CHLORIDE 0.45 % IV SOLN
INTRAVENOUS | Status: AC
  Administered 2018-09-02 – 2018-09-03 (×2): via INTRAVENOUS

## 2018-09-02 MED ORDER — ONDANSETRON HCL 4 MG/2ML IJ SOLN
4.0000 mg | Freq: Four times a day (QID) | INTRAMUSCULAR | Status: DC | PRN
  Administered 2018-09-02 – 2018-09-03 (×2): 4 mg via INTRAVENOUS
  Filled 2018-09-02 (×3): qty 2

## 2018-09-02 MED ORDER — KETOROLAC TROMETHAMINE 30 MG/ML IJ SOLN
30.0000 mg | INTRAMUSCULAR | Status: AC
  Administered 2018-09-02: 30 mg via INTRAVENOUS
  Filled 2018-09-02: qty 1

## 2018-09-02 MED ORDER — SODIUM CHLORIDE 0.9% FLUSH: 3.0000 mL | Freq: Once | INTRAVENOUS | Status: DC

## 2018-09-02 MED ORDER — OXYCODONE ER 13.5 MG PO C12A: 13.5000 mg | EXTENDED_RELEASE_CAPSULE | Freq: Two times a day (BID) | ORAL | Status: DC

## 2018-09-02 MED ORDER — SODIUM CHLORIDE 0.9% FLUSH: 9.0000 mL | INTRAVENOUS | Status: DC | PRN

## 2018-09-02 MED ORDER — OXYCODONE HCL 5 MG PO TABS
20.0000 mg | ORAL_TABLET | ORAL | Status: DC | PRN
  Administered 2018-09-02 – 2018-09-06 (×12): 20 mg via ORAL
  Filled 2018-09-02 (×13): qty 4

## 2018-09-02 MED ORDER — HYDROMORPHONE HCL 2 MG/ML IJ SOLN
2.0000 mg | INTRAMUSCULAR | Status: DC | PRN
  Administered 2018-09-02 (×3): 2 mg via INTRAVENOUS
  Filled 2018-09-02 (×3): qty 1

## 2018-09-02 MED ORDER — POLYETHYLENE GLYCOL 3350 17 G PO PACK: 17.0000 g | PACK | Freq: Every day | ORAL | Status: DC | PRN

## 2018-09-02 NOTE — ED Notes (Signed)
ED TO INPATIENT HANDOFF REPORT  ED Nurse Name and Phone #: Christinia Gully Name/Age/Gender Stephanie Burch 38 y.o. female Room/Bed: WA21/WA21  Code Status   Code Status: Prior  Home/SNF/Other given to floor Patient oriented to: self, place, time and situation Is this baseline? Yes   Triage Complete: Triage complete  Chief Complaint Sickle Cell Pain Crisis  Triage Note Patient here from home with complaints of sickle cell pain crisis that started today. Home meds with no relief. Pain increased in bilateral legs and arms.    Allergies No Known Allergies  Level of Care/Admitting Diagnosis ED Disposition    ED Disposition Condition Comment   Admit  Hospital Area: [place] [[id number]]  Level of Care: Med-Surg [16]  Covid Evaluation: Asymptomatic Screening Protocol (No Symptoms)  Diagnosis: Sickle cell pain crisis [facility]) [[id number]]  Admitting Physician: Vianne Bulls [[id number]]  Attending Physician: Vianne Bulls [[id number]]  Estimated length of stay: past midnight tomorrow  Certification:: I certify this patient will need inpatient services for at least 2 midnights  PT Class (Do Not Modify): Inpatient [101]  PT Acc Code (Do Not Modify): Private [1]       B Medical/Surgery History Past Medical History:  Diagnosis Date  . Sickle cell anemia (HCC)    History reviewed. No pertinent surgical history.   A IV Location/Drains/Wounds Patient Lines/Drains/Airways Status   Active Line/Drains/Airways    Name:   Placement date:   Placement time:   Site:   Days:   Peripheral IV 09/02/18 Left Antecubital   09/02/18    0130    Antecubital   less than 1          Intake/Output Last 24 hours No intake or output data in the 24 hours ending 09/02/18 1835  Labs/Imaging Results for orders placed or performed during the hospital encounter of 09/02/18 (from the past 48 hour(s))  Comprehensive metabolic panel     Status: Abnormal   Collection Time: 09/02/18  12:32 AM  Result Value Ref Range   Sodium 143 135 - 145 mmol/L   Potassium 4.0 3.5 - 5.1 mmol/L   Chloride 108 98 - 111 mmol/L   CO2 27 22 - 32 mmol/L   Glucose, Bld 100 (H) 70 - 99 mg/dL   BUN 10 6 - 20 mg/dL   Creatinine, Ser 0.84 0.44 - 1.00 mg/dL   Calcium 9.7 8.9 - 10.3 mg/dL   Total Protein 7.2 6.5 - 8.1 g/dL   Albumin 4.0 3.5 - 5.0 g/dL   AST 11 (L) 15 - 41 U/L   ALT 15 0 - 44 U/L   Alkaline Phosphatase 53 38 - 126 U/L   Total Bilirubin 0.9 0.3 - 1.2 mg/dL   GFR calc non Af Amer >60 >60 mL/min   GFR calc Af Amer >60 >60 mL/min   Anion gap 8 5 - 15    Comment: Performed at [facility], [place] [address] [place], [place] [address]  CBC with Differential     Status: Abnormal   Collection Time: 09/02/18 12:32 AM  Result Value Ref Range   WBC 8.3 4.0 - 10.5 K/uL   RBC 5.01 3.87 - 5.11 MIL/uL   Hemoglobin 12.4 12.0 - 15.0 g/dL   HCT 36.8 36.0 - 46.0 %   MCV 73.5 (L) 80.0 - 100.0 fL   MCH 24.8 (L) 26.0 - 34.0 pg   MCHC 33.7 30.0 - 36.0 g/dL   RDW 16.6 (H) 11.5 - 15.5 %  Platelets 284 150 - 400 K/uL   nRBC 0.2 0.0 - 0.2 %   Neutrophils Relative % 63 %   Neutro Abs 5.2 1.7 - 7.7 K/uL   Lymphocytes Relative 29 %   Lymphs Abs 2.4 0.7 - 4.0 K/uL   Monocytes Relative 7 %   Monocytes Absolute 0.6 0.1 - 1.0 K/uL   Eosinophils Relative 1 %   Eosinophils Absolute 0.1 0.0 - 0.5 K/uL   Basophils Relative 0 %   Basophils Absolute 0.0 0.0 - 0.1 K/uL   Immature Granulocytes 0 %   Abs Immature Granulocytes 0.02 0.00 - 0.07 K/uL    Comment: Performed at [facility], [address] [place], [place] [address]  Reticulocytes     Status: Abnormal   Collection Time: 09/02/18 12:32 AM  Result Value Ref Range   Retic Ct Pct 4.1 (H) 0.4 - 3.1 %   RBC. 5.01 3.87 - 5.11 MIL/uL   Retic Count, Absolute 204.4 (H) 19.0 - 186.0 K/uL   Immature Retic Fract 27.8 (H) 2.3 - 15.9 %    Comment: Performed at [facility], [address]  [place], [place] [address]   No results found.  Pending Labs [employer] (From admission, onward)    Start     Ordered   09/02/18 0352  SARS Coronavirus 2 (CEPHEID - Performed in [facility] hospital lab), Hosp Order  (Asymptomatic Patients Labs)  Once,   STAT    Question:  Rule Out  Answer:  Yes   09/02/18 0351   Signed and Held  Creatinine, serum  (enoxaparin (LOVENOX)    CrCl >/= 30 ml/min)  Weekly,   R    Comments: while on enoxaparin therapy    Signed and Held          Vitals/Pain Today's Vitals   09/02/18 1418 09/02/18 1517 09/02/18 1600 09/02/18 1824  BP:   (!) 135/98   Pulse:   94   Resp:   13   Temp:      TempSrc:      SpO2:   97%   PainSc: 8  8   8      Isolation Precautions No active isolations  Medications Medications  sodium chloride flush (NS) 0.9 % injection 3 mL (3 mLs Intravenous Not Given 09/02/18 1051)  HYDROmorphone (DILAUDID) injection 2 mg (2 mg Intravenous Given 09/02/18 0453)    Or  HYDROmorphone (DILAUDID) injection 2 mg ( Subcutaneous See Alternative 09/02/18 0453)  diphenhydrAMINE (BENADRYL) capsule 25-50 mg (has no administration in time range)  ondansetron (ZOFRAN) injection 4 mg (4 mg Intravenous Given 09/02/18 0131)  ketorolac (TORADOL) 15 MG/ML injection 15 mg (15 mg Intravenous Given 09/02/18 1712)  oxyCODONE (OXYCONTIN) 12 hr tablet 10 mg (10 mg Oral Given 09/02/18 1055)  HYDROmorphone (DILAUDID) injection 2 mg (2 mg Intravenous Given 09/02/18 1658)  oxyCODONE (Oxy IR/ROXICODONE) immediate release tablet 20 mg (has no administration in time range)  ketorolac (TORADOL) 30 MG/ML injection 30 mg (30 mg Intravenous Given 09/02/18 0132)  HYDROmorphone (DILAUDID) injection 2 mg (2 mg Intravenous Given 09/02/18 0134)    Or  HYDROmorphone (DILAUDID) injection 2 mg ( Subcutaneous See Alternative 09/02/18 0134)  HYDROmorphone (DILAUDID) injection 2 mg (2 mg Intravenous Given 09/02/18 0229)    Or  HYDROmorphone (DILAUDID) injection 2 mg ( Subcutaneous See  Alternative 09/02/18 0229)  HYDROmorphone (DILAUDID) injection 2 mg (2 mg Intravenous Given 09/02/18 0308)    Or  HYDROmorphone (DILAUDID) injection 2 mg (  Subcutaneous See Alternative 09/02/18 0308)    Mobility walks Low fall risk   Focused Assessments Cardiac Assessment Handoff:    No results found for: CKTOTAL, CKMB, CKMBINDEX, TROPONINI No results found for: DDIMER Does the Patient currently have chest pain? No      R Recommendations: See Admitting Provider Note  Report given to:   Additional Notes:

## 2018-09-02 NOTE — H&P (Signed)
History and Physical    Stephanie Burch ZOX:[id number] DOB: 1980/12/21 DOA: 09/02/2018  PCP: Kallie LocksStroud, [name], FNP   Patient coming from: Home   Chief Complaint: Increased pain in left arm and bilaterally thighs   HPI: Stephanie StarchKimaada Adami is a 38 y.o. female with medical history significant for sickle cell anemia and chronic pain, now presenting to the emergency department for evaluation of severe pain involving her left arm and bilateral thighs.  Patient reports that her chronic pain had been worse than usual for the past several days, she ran out of her short acting oxycodone, and her pain has become severe.  She describes the symptoms as consistent with her prior pain crises, but little bit more severe than usual.  She denies any recent fevers, chills, cough, or shortness of breath.  She denies chest pain.  ED Course: Upon arrival to the ED, patient is found to be afebrile, saturating well on room air, and with remaining vitals normal.  Chemistry panel is unremarkable and CBC notable for microcytosis without anemia.  Patient was given several doses of Dilaudid in the ED, reports some improvement with this, but continues to complain of severe pain and hospitalists are consulted for admission.  Review of Systems:  All other systems reviewed and apart from HPI, are negative.  Past Medical History:  Diagnosis Date  . Sickle cell anemia (HCC)     History reviewed. No pertinent surgical history.   reports that she has never smoked. She has never used smokeless tobacco. She reports that she does not drink alcohol or use drugs.  No Known Allergies  Family History  Problem Relation Age of Onset  . Hypertension Mother   . Sickle cell trait Mother   . Diabetes Father   . Sickle cell trait Father      Prior to Admission medications   Medication Sig Start Date End Date Taking? Authorizing Provider  acetaminophen (TYLENOL) 500 MG tablet Take 500 mg by mouth every 6 (six) hours as needed for  mild pain.    Yes [provider]  folic acid (FOLVITE) 1 MG tablet Take 1 tablet (1 mg total) by mouth daily. 07/14/18  Yes Kallie LocksStroud, [name], FNP  ibuprofen (ADVIL,MOTRIN) 800 MG tablet Take 1 tablet (800 mg total) by mouth every 8 (eight) hours as needed for headache, mild pain, moderate pain or cramping. 01/13/18  Yes Kallie LocksStroud, [name], FNP  ondansetron (ZOFRAN) 4 MG tablet Take 1 tablet (4 mg total) by mouth every 8 (eight) hours as needed for nausea or vomiting. 07/17/18  Yes Kallie LocksStroud, [name], FNP  oxyCODONE ER [facility](XTAMPZA ER) 13.5 MG C12A Take 13.5 mg by mouth every 12 (twelve) hours for 30 days. 08/11/18 09/10/18 Yes Kallie LocksStroud, [name], FNP  Oxycodone HCl 10 MG TABS Take 2 tablets (total dose = 20 mg), every 4 hours as needed for pain. 08/23/18  Yes Kallie LocksStroud, [name], FNP  tiZANidine (ZANAFLEX) 4 MG tablet Take 1 tablet every 4 hours as needed. Patient taking differently: Take 4 mg by mouth every 4 (four) hours as needed for muscle spasms. Take 1 tablet every 4 hours as needed. 08/30/18  Yes Kallie LocksStroud, [name], FNP  voxelotor (OXBRYTA) 500 MG TABS tablet Take 1,500 mg by mouth daily. For Sickle Cell Anemia   Yes [provider]  docusate sodium (COLACE) 100 MG capsule Take 1 capsule (100 mg total) by mouth 2 (two) times daily. Patient not taking: Reported on 09/02/2018 04/26/18   Kallie LocksStroud, [name], FNP  hydroxyurea (HYDREA) 500 MG capsule Take 4 capsules (2,000 mg total) by mouth daily. May take with food to minimize GI side effects. Patient not taking: Reported on 08/25/2018 02/16/18   Azzie Glatter, FNP  Vitamin D, Ergocalciferol, (DRISDOL) 50000 units CAPS capsule Take 1 capsule (50,000 Units total) by mouth every 7 (seven) days. Patient not taking: Reported on 08/25/2018 12/02/17   Azzie Glatter, FNP    Physical Exam: Vitals:   09/02/18 0029  BP: (!) 156/97  Pulse: 87  Resp: 15  Temp: 98.6 F (37 C)  TempSrc: Oral  SpO2: 100%    Constitutional: NAD, calm  Eyes:  PERTLA, lids and conjunctivae normal ENMT: Mucous membranes are moist. Posterior pharynx clear of any exudate or lesions.   Neck: normal, supple, no masses, no thyromegaly Respiratory: clear to auscultation bilaterally, no wheezing, no crackles. Normal respiratory effort. No accessory muscle use.  Cardiovascular: S1 & S2 heard, regular rate and rhythm. No extremity edema.  Abdomen: No distension, no tenderness, soft. Bowel sounds normal.  Musculoskeletal: no clubbing / cyanosis. No joint deformity upper and lower extremities.  Skin: no significant rashes, lesions, ulcers. Warm, dry, well-perfused. Neurologic: CN 2-12 grossly intact. Sensation intact. Strength 5/5 in all 4 limbs.  Psychiatric: Alert and oriented x 3. Pleasant, cooperative.    Labs on Admission: I have personally reviewed following labs and imaging studies  CBC: Recent Labs  Lab 09/02/18 0032  WBC 8.3  NEUTROABS 5.2  HGB 12.4  HCT 36.8  MCV 73.5*  PLT 093   Basic Metabolic Panel: Recent Labs  Lab 09/02/18 0032  NA 143  K 4.0  CL 108  CO2 27  GLUCOSE 100*  BUN 10  CREATININE 0.84  CALCIUM 9.7   GFR: Estimated Creatinine Clearance: 123.5 mL/min (by C-G formula based on SCr of 0.84 mg/dL). Liver Function Tests: Recent Labs  Lab 09/02/18 0032  AST 11*  ALT 15  ALKPHOS 53  BILITOT 0.9  PROT 7.2  ALBUMIN 4.0   No results for input(s): LIPASE, AMYLASE in the last 168 hours. No results for input(s): AMMONIA in the last 168 hours. Coagulation Profile: No results for input(s): INR, PROTIME in the last 168 hours. Cardiac Enzymes: No results for input(s): CKTOTAL, CKMB, CKMBINDEX, TROPONINI in the last 168 hours. BNP (last 3 results) No results for input(s): PROBNP in the last 8760 hours. HbA1C: No results for input(s): HGBA1C in the last 72 hours. CBG: No results for input(s): GLUCAP in the last 168 hours. Lipid Profile: No results for input(s): CHOL, HDL, LDLCALC, TRIG, CHOLHDL, LDLDIRECT in the  last 72 hours. Thyroid Function Tests: No results for input(s): TSH, T4TOTAL, FREET4, T3FREE, THYROIDAB in the last 72 hours. Anemia Panel: Recent Labs    09/02/18 0032  RETICCTPCT 4.1*   Urine analysis:    Component Value Date/Time   COLORURINE YELLOW 02/09/2018 0933   APPEARANCEUR CLEAR 02/09/2018 0933   LABSPEC 1.011 02/09/2018 0933   PHURINE 5.0 02/09/2018 0933   GLUCOSEU NEGATIVE 02/09/2018 0933   HGBUR NEGATIVE 02/09/2018 0933   BILIRUBINUR negative 08/25/2018 0826   KETONESUR negative 08/25/2018 0826   KETONESUR NEGATIVE 02/09/2018 0933   PROTEINUR negative 08/25/2018 0826   PROTEINUR NEGATIVE 02/09/2018 0933   UROBILINOGEN 1.0 08/25/2018 0826   NITRITE Negative 08/25/2018 0826   NITRITE NEGATIVE 02/09/2018 0933   LEUKOCYTESUR Negative 08/25/2018 0826   Sepsis Labs: @LABRCNTIP (procalcitonin:4,lacticidven:4) )No results found for this or any previous visit (from the past 240 hour(s)).   Radiological Exams on  Admission: No results found.  EKG: Not performed.   Assessment/Plan   1. Sickle cell anemia with pain crisis; chronic pain  - Presents with severe pain in left arm and bilateral legs, improved after several doses of Diluadid in ED but continues to report severe pain  - Continue long-acting oxycodone, hold Advil and schedule Toradol, start IVF hydration, start wt-based Dilaudid PCA     PPE: Mask, face shield  DVT prophylaxis: Lovenox  Code Status: Full  Family Communication: Discussed with patient  Consults called: None Admission status: Inpatient     Briscoe Deutscherimothy S Opyd, MD [facility] Pager [phone]  If 7PM-7AM, please contact night-coverage [email or web address] Password [email or web address]  09/02/2018, 3:50 AM

## 2018-09-02 NOTE — Telephone Encounter (Signed)
Patient called, complained of pain in the legs and arms rated at 10/10. Denied chest pain, fever, diarrhea, abdominal pain, nausea/vomitting. Screened negative for Covid-19 symptoms. Admitted to having means of transportation without driving self after treatment. Presently at the ER, will like to transfer to the day hospital. Per provider, since patient is presently holding at the ER, she will not be able to come to the day hospital. Provider will round on patient as soon as possible. Patient received 5 mg of dilaudid "earlier" and Toradol about "5 minutes" ago. Patient notified, verbalized understanding.

## 2018-09-02 NOTE — ED Notes (Signed)
Patient ambulated to restroom at this time.

## 2018-09-02 NOTE — ED Notes (Signed)
Educated patient that instead of giving Oxycodone at 10 am, will give it at 11 am (see MAR-- Dilaudid admin at [phone]). Meal tray given to patient. No other concerns identified. Will continue to monitor.

## 2018-09-02 NOTE — ED Provider Notes (Signed)
[facility]-EMERGENCY DEPT Provider Note   CSN: [id number] Arrival date & time: 09/01/18  2330     History   Chief Complaint Chief Complaint  Patient presents with  . Sickle Cell Pain Crisis    HPI Stephanie Burch is a 38 y.o. female.     The history is provided by the patient.  Sickle Cell Pain Crisis Location:  Upper extremity and lower extremity Severity:  Severe Onset quality:  Gradual Duration:  1 day Timing:  Constant Progression:  Worsening Chronicity:  New Relieved by:  Nothing Worsened by:  Nothing Associated symptoms: no chest pain, no cough, no fever, no shortness of breath and no wheezing    Patient with history of sickle cell presents with pain crisis.  She reports pain in her left arm as well as bilateral thighs.  This is similar to prior episodes.  No fever/vomiting/cough/chest pain/shortness breath. Past Medical History:  Diagnosis Date  . Sickle cell anemia [facility](HCC)     Patient Active Problem List   Diagnosis Date Noted  . Muscle spasms of both lower extremities 08/25/2018  . Subjective visual disturbance of right eye 08/25/2018  . 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 Chronic pain syndrome 03/16/2018  . Chronic, continuous use of opioids 03/16/2018  . Tachycardia with heart rate 100-120 beats per minute   . Sickle cell anemia with crisis (HCC) 10/19/2017  . Leukocytosis 10/19/2017  . Thrombocytopenia (HCC) 10/19/2017  . Morbidly obese (HCC) 10/19/2017  . Sickle cell pain crisis (HCC) 10/15/2017    History reviewed. No pertinent surgical history.   OB History   No obstetric history on file.      Home Medications    Prior to Admission medications   Medication Sig Start Date End Date Taking? Authorizing Provider  acetaminophen (TYLENOL) 500 MG tablet Take 500 mg by mouth every 6 (six) hours as needed.    [provider]  docusate sodium (COLACE) 100 MG capsule Take 1 capsule (100 mg total) by mouth 2  (two) times daily. 04/26/18   Kallie LocksStroud, [name], FNP  folic acid (FOLVITE) 1 MG tablet Take 1 tablet (1 mg total) by mouth daily. 07/14/18   Kallie LocksStroud, [name], FNP  hydroxyurea (HYDREA) 500 MG capsule Take 4 capsules (2,000 mg total) by mouth daily. May take with food to minimize GI side effects. Patient not taking: Reported on 08/25/2018 02/16/18   Kallie LocksStroud, [name], FNP  ibuprofen (ADVIL,MOTRIN) 800 MG tablet Take 1 tablet (800 mg total) by mouth every 8 (eight) hours as needed for headache, mild pain, moderate pain or cramping. 01/13/18   Kallie LocksStroud, [name], FNP  ondansetron (ZOFRAN) 4 MG tablet Take 1 tablet (4 mg total) by mouth every 8 (eight) hours as needed for nausea or vomiting. 07/17/18   Kallie LocksStroud, [name], FNP  oxyCODONE ER [facility](XTAMPZA ER) 13.5 MG C12A Take 13.5 mg by mouth every 12 (twelve) hours for 30 days. 08/11/18 09/10/18  Kallie LocksStroud, [name], FNP  Oxycodone HCl 10 MG TABS Take 2 tablets (total dose = 20 mg), every 4 hours as needed for pain. 08/23/18   Kallie LocksStroud, [name], FNP  tiZANidine (ZANAFLEX) 4 MG tablet Take 1 tablet every 4 hours as needed. 08/30/18   Kallie LocksStroud, [name], FNP  Vitamin D, Ergocalciferol, (DRISDOL) 50000 units CAPS capsule Take 1 capsule (50,000 Units total) by mouth every 7 (seven) days. Patient not taking: Reported on 08/25/2018 12/02/17   Kallie LocksStroud, [name], FNP  voxelotor (OXBRYTA) 500 MG TABS  tablet Take 1,500 mg by mouth daily. For Sickle Cell Anemia    [provider]    Family History Family History  Problem Relation Age of Onset  . Hypertension Mother   . Sickle cell trait Mother   . Diabetes Father   . Sickle cell trait Father     Social History Social History   Tobacco Use  . Smoking status: Never Smoker  . Smokeless tobacco: Never Used  Substance Use Topics  . Alcohol use: Never    Frequency: Never  . Drug use: Never     Allergies   Patient has no known allergies.   Review of Systems Review of Systems  Constitutional: Negative for  fever.  Respiratory: Negative for cough, shortness of breath and wheezing.   Cardiovascular: Negative for chest pain.  Musculoskeletal: Positive for arthralgias.  Neurological: Negative for weakness.  All other systems reviewed and are negative.    Physical Exam Updated Vital Signs BP (!) 156/97 (BP Location: Right Arm)   Pulse 87   Temp 98.6 F (37 C) (Oral)   Resp 15   LMP 08/12/2018   SpO2 100%   Physical Exam CONSTITUTIONAL: Well developed/well nourished, uncomfortable appearing HEAD: Normocephalic/atraumatic EYES: EOMI/PERRL ENMT: Mucous membranes moist NECK: supple no meningeal signs SPINE/BACK:entire spine nontender CV: S1/S2 noted, no murmurs/rubs/gallops noted LUNGS: Lungs are clear to auscultation bilaterally, no apparent distress ABDOMEN: soft, nontender, no rebound or guarding, bowel sounds noted throughout abdomen GU:no cva tenderness NEURO: Pt is awake/alert/appropriate, moves all extremitiesx4.  No facial droop.  No focal weakness EXTREMITIES: pulses normal/equal, full ROM, no extremity edema, no bruising, diffuse tenderness to bilateral thighs but no induration SKIN: warm, color normal PSYCH: no abnormalities of mood noted, alert and oriented to situation   ED Treatments / Results  Labs (all labs ordered are listed, but only abnormal results are displayed) Labs Reviewed  COMPREHENSIVE METABOLIC PANEL - Abnormal; Notable for the following components:      Result Value   Glucose, Bld 100 (*)    AST 11 (*)    All other components within normal limits  CBC WITH DIFFERENTIAL/PLATELET - Abnormal; Notable for the following components:   MCV 73.5 (*)    MCH 24.8 (*)    RDW 16.6 (*)    All other components within normal limits  RETICULOCYTES - Abnormal; Notable for the following components:   Retic Ct Pct 4.1 (*)    Retic Count, Absolute 204.4 (*)    Immature Retic Fract 27.8 (*)    All other components within normal limits  SARS CORONAVIRUS 2 (HOSPITAL  ORDER, PERFORMED IN [facility] LAB)    EKG None  Radiology No results found.  Procedures Procedures (including critical care time)  Medications Ordered in ED Medications  sodium chloride flush (NS) 0.9 % injection 3 mL (has no administration in time range)  ketorolac (TORADOL) 30 MG/ML injection 30 mg (has no administration in time range)  HYDROmorphone (DILAUDID) injection 2 mg (has no administration in time range)    Or  HYDROmorphone (DILAUDID) injection 2 mg (has no administration in time range)  HYDROmorphone (DILAUDID) injection 2 mg (has no administration in time range)    Or  HYDROmorphone (DILAUDID) injection 2 mg (has no administration in time range)  HYDROmorphone (DILAUDID) injection 2 mg (has no administration in time range)    Or  HYDROmorphone (DILAUDID) injection 2 mg (has no administration in time range)  HYDROmorphone (DILAUDID) injection 2 mg (has no administration in time  range)    Or  HYDROmorphone (DILAUDID) injection 2 mg (has no administration in time range)  diphenhydrAMINE (BENADRYL) capsule 25-50 mg (has no administration in time range)  ondansetron (ZOFRAN) injection 4 mg (has no administration in time range)     Initial Impression / Assessment and Plan / ED Course  I have reviewed the triage vital signs and the nursing notes.  Pertinent labs  results that were available during my care of the patient were reviewed by me and considered in my medical decision making (see chart for details).        Patient presented with reports of sickle cell pain crisis.  She reported pain in the left arm as well as bilateral legs.  Labs were reassuring.  After multiple doses of Dilaudid, patient still with pain.  Will admit to the hospital.  Discussed with Dr. opened for admission. She is afebrile, no hypoxia, denies chest pain.  Final Clinical Impressions(s) / ED Diagnoses   Final diagnoses:  Sickle cell pain crisis [facility])    ED Discharge Orders     None       Ripley Fraise, MD 09/02/18 [id number]

## 2018-09-02 NOTE — ED Triage Notes (Signed)
Patient here from home with complaints of sickle cell pain crisis that started today. Home meds with no relief. Pain increased in bilateral legs and arms.

## 2018-09-02 NOTE — Progress Notes (Signed)
Stephanie Burch, a 38 year old female with a medical history significant for sickle cell disease, chronic pain syndrome and opiate dependence was admitted for sickle cell pain crisis this am.   Patient complaining of 10/10 pain primarily to right lower extremity. Pain has worsened over the past several hours while patient is awaiting a room.   Plan:  Dilaudid 2 mg IV every 2 hours as needed for severe breakthrough pain Oxycodone 20 mg every 4 hours as needed per home medication regimen.   PCA will be initiated when patient is assigned an inpatient room. She is currently holding in ER.   [name]  APRN, MSN, FNP-C Patient [place] [address] [place], [place] [address] [phone]

## 2018-09-02 NOTE — Plan of Care (Signed)
  Problem: Education: Goal: Knowledge of vaso-occlusive preventative measures will improve Outcome: Progressing   Problem: Education: Goal: Awareness of infection prevention will improve Outcome: Progressing   Problem: Self-Care: Goal: Ability to incorporate actions that prevent/reduce pain crisis will improve Outcome: Progressing   Problem: Tissue Perfusion: Goal: Complications related to inadequate tissue perfusion will be avoided or minimized Outcome: Progressing   Problem: Sensory: Goal: Pain level will decrease with appropriate interventions Outcome: Progressing

## 2018-09-03 ENCOUNTER — Telehealth: Payer: Self-pay | Admitting: Family Medicine

## 2018-09-03 ENCOUNTER — Other Ambulatory Visit: Payer: Self-pay | Admitting: Family Medicine

## 2018-09-03 DIAGNOSIS — F1190 Opioid use, unspecified, uncomplicated: Secondary | ICD-10-CM

## 2018-09-03 DIAGNOSIS — D571 Sickle-cell disease without crisis: Secondary | ICD-10-CM

## 2018-09-03 DIAGNOSIS — G894 Chronic pain syndrome: Secondary | ICD-10-CM

## 2018-09-03 DIAGNOSIS — D5700 Hb-SS disease with crisis, unspecified: Principal | ICD-10-CM

## 2018-09-03 MED ORDER — PROMETHAZINE HCL 25 MG/ML IJ SOLN
12.5000 mg | INTRAMUSCULAR | Status: DC | PRN
  Administered 2018-09-03 – 2018-09-05 (×3): 12.5 mg via INTRAVENOUS
  Filled 2018-09-03 (×3): qty 1

## 2018-09-03 MED ORDER — XTAMPZA ER 13.5 MG PO C12A: 13.5000 mg | EXTENDED_RELEASE_CAPSULE | Freq: Two times a day (BID) | ORAL | 0 refills | Status: DC

## 2018-09-03 MED ORDER — OXYCODONE HCL 10 MG PO TABS: ORAL_TABLET | ORAL | 0 refills | Status: DC

## 2018-09-03 NOTE — Telephone Encounter (Signed)
Stephanie Burch Pt said that she is being discharge Sunday and the next day Monday is when her meds are due for refill and she just dont want to get home and have nothing for pain I let her know what you said which was for her to call us back once she is discharged but she kept stateing that [place] is her refill due date

## 2018-09-03 NOTE — Telephone Encounter (Signed)
error 

## 2018-09-03 NOTE — Progress Notes (Signed)
Subjective: Stephanie Burch, a 38 year old female with a medical history significant for sickle cell disease, chronic pain syndrome, opiate dependence and chronic opiate use, anemia of chronic disease, and morbid obesity was admitted for sickle cell pain crisis.  Patient states that pain is minimally controlled on current medication regimen. She says that she has been using PCA Dilaudid consistently, no sustained relief.  Current pain intensity 9/10 primarily to lower extremities characterized as constant and throbbing.  Patient denies headache, chest pain, dysuria, shortness of breath,  vomiting, or diarrhea.  Objective:  Vital signs in last 24 hours:  Vitals:   09/03/18 0418 09/03/18 0800 09/03/18 1049 09/03/18 1058  BP: (!) 147/88   126/75  Pulse: 72   83  Resp: 14 10 16 18   Temp: 98 F (36.7 C)   98 F (36.7 C)  TempSrc:    Oral  SpO2: 100% 100% 96% 96%  Weight:      Height:        Intake/Output from previous day:   Intake/Output Summary (Last 24 hours) at 09/03/2018 1152 Last data filed at 09/03/2018 0900 Gross per 24 hour  Intake 1323.13 ml  Output -  Net 1323.13 ml   Physical Exam Constitutional:      Appearance: She is obese.  HENT:     Head: Normocephalic.  Eyes:     Pupils: Pupils are equal, round, and reactive to light.  Cardiovascular:     Rate and Rhythm: Normal rate and regular rhythm.     Pulses: Normal pulses.  Pulmonary:     Effort: Pulmonary effort is normal.  Abdominal:     General: Bowel sounds are normal.  Musculoskeletal: Normal range of motion.  Neurological:     General: No focal deficit present.  Psychiatric:        Mood and Affect: Mood normal.        Behavior: Behavior normal.        Thought Content: Thought content normal.        Judgment: Judgment normal.     Lab Results:  Basic Metabolic Panel:    Component Value Date/Time   NA 143 09/02/2018 0032   NA 142 09/11/2017 1431   K 4.0 09/02/2018 0032   CL 108 09/02/2018 0032   CO2 27 09/02/2018 0032   BUN 10 09/02/2018 0032   BUN 8 09/11/2017 1431   CREATININE 0.84 09/02/2018 0032   GLUCOSE 100 (H) 09/02/2018 0032   CALCIUM 9.7 09/02/2018 0032   CBC:    Component Value Date/Time   WBC 8.3 09/02/2018 0032   HGB 12.4 09/02/2018 0032   HGB 13.0 09/11/2017 1431   HCT 36.8 09/02/2018 0032   HCT 38.4 09/11/2017 1431   PLT 284 09/02/2018 0032   PLT 258 09/11/2017 1431   MCV 73.5 (L) 09/02/2018 0032   MCV 75 (L) 09/11/2017 1431   NEUTROABS 5.2 09/02/2018 0032   NEUTROABS 3.9 09/11/2017 1431   LYMPHSABS 2.4 09/02/2018 0032   LYMPHSABS 2.2 09/11/2017 1431   MONOABS 0.6 09/02/2018 0032   EOSABS 0.1 09/02/2018 0032   EOSABS 0.1 09/11/2017 1431   BASOSABS 0.0 09/02/2018 0032   BASOSABS 0.0 09/11/2017 1431    Recent Results (from the past 240 hour(s))  SARS Coronavirus 2 (CEPHEID - Performed in [facility]), Hosp Order     Status: None   Collection Time: 09/02/18  6:00 PM   Specimen: Nasopharyngeal Swab  Result Value Ref Range Status   SARS Coronavirus 2 NEGATIVE NEGATIVE  Final    Comment: (NOTE) If result is NEGATIVE SARS-CoV-2 target nucleic acids are NOT DETECTED. The SARS-CoV-2 RNA is generally detectable in upper and lower  respiratory specimens during the acute phase of infection. The lowest  concentration of SARS-CoV-2 viral copies this assay can detect is 250  copies / mL. A negative result does not preclude SARS-CoV-2 infection  and should not be used as the sole basis for treatment or other  patient management decisions.  A negative result may occur with  improper specimen collection / handling, submission of specimen other  than nasopharyngeal swab, presence of viral mutation(s) within the  areas targeted by this assay, and inadequate number of viral copies  (<250 copies / mL). A negative result must be combined with clinical  observations, patient history, and epidemiological information. If result is POSITIVE SARS-CoV-2 target  nucleic acids are DETECTED. The SARS-CoV-2 RNA is generally detectable in upper and lower  respiratory specimens dur ing the acute phase of infection.  Positive  results are indicative of active infection with SARS-CoV-2.  Clinical  correlation with patient history and other diagnostic information is  necessary to determine patient infection status.  Positive results do  not rule out bacterial infection or co-infection with other viruses. If result is PRESUMPTIVE POSTIVE SARS-CoV-2 nucleic acids MAY BE PRESENT.   A presumptive positive result was obtained on the submitted specimen  and confirmed on repeat testing.  While 2019 novel coronavirus  (SARS-CoV-2) nucleic acids may be present in the submitted sample  additional confirmatory testing may be necessary for epidemiological  and / or clinical management purposes  to differentiate between  SARS-CoV-2 and other Sarbecovirus currently known to infect humans.  If clinically indicated additional testing with an alternate test  methodology [phone]([id number]) is advised. The SARS-CoV-2 RNA is generally  detectable in upper and lower respiratory sp ecimens during the acute  phase of infection. The expected result is Negative. Fact Sheet for Patients:  BoilerBrush.com.cyhttps://www.fda.gov/media/136312/download Fact Sheet for Healthcare Providers: https://pope.com/https://www.fda.gov/media/136313/download This test is not yet approved or cleared by the [place] FDA and has been authorized for detection and/or diagnosis of SARS-CoV-2 by FDA under an Emergency Use Authorization (EUA).  This EUA will remain in effect (meaning this test can be used) for the duration of the COVID-19 declaration under Section 564(b)(1) of the Act, 21 U.S.C. section 360bbb-3(b)(1), unless the authorization is terminated or revoked sooner. Performed at [facility], [address] [place], [place] [id number]     Studies/Results: No results found.  Medications: Scheduled  Meds: . enoxaparin (LOVENOX) injection  40 mg Subcutaneous Q24H  . folic acid  1 mg Oral Daily  . HYDROmorphone   Intravenous Q4H  . ketorolac  15 mg Intravenous Q6H  . oxyCODONE  10 mg Oral Q12H  . senna-docusate  1 tablet Oral BID  . sodium chloride flush  3 mL Intravenous Once   Continuous Infusions: . diphenhydrAMINE     PRN Meds:.diphenhydrAMINE **OR** diphenhydrAMINE, naloxone **AND** sodium chloride flush, ondansetron (ZOFRAN) IV, oxyCODONE, polyethylene glycol, tiZANidine   Assessment/Plan: Principal Problem:   Sickle cell pain crisis (HCC) Active Problems:   Chronic pain syndrome   Sickle cell disease with pain crisis: [facility] PCA via weight-based protocol.  No change in settings. IV Toradol 15 mg every 6 hours Monitor vital signs closely 2 L of supplemental oxygen as needed.  Oxycodone 20 mg every 4 hours as needed.   Chronic pain syndrome:  Oxycontin  10 mg every 12 hours Oxycodone 20 mg every 4 hours as needed  Sickle cell anemia:  Hemoglobin stable. Continue to follow closely  Code Status: Full Code Family Communication: N/A Disposition Plan: Not yet ready for discharge    [place], MSN, FNP-C Patient [place], [place] [address] [phone]  If 5PM-7AM, please contact night-coverage.  09/03/2018, 11:52 AM  LOS: 1 day

## 2018-09-04 NOTE — Progress Notes (Signed)
Subjective: A 38 year old female with sickle cell disease and chronic pain syndrome who was admitted with sickle cell painful crisis.  Patient has been on treatment with IV Dilaudid PCA, Toradol as well as her Roxicodone.  She is complaining of 10 out of 10 pain in her back and legs.  She is having muscle spasm despite Zanaflex.  She has been using the Dilaudid PCA with minimal relief.  No fever or chills no nausea vomiting or diarrhea.  Objective: Vital signs in last 24 hours: Temp:  [98.5 F (36.9 C)-98.8 F (37.1 C)] 98.8 F (37.1 C) (08/01 1146) Pulse Rate:  [73-86] 80 (08/01 1146) Resp:  [12-18] 18 (08/01 1252) BP: (124-137)/(49-72) 137/68 (08/01 1146) SpO2:  [94 %-100 %] 96 % (08/01 1252) Weight change:  Last BM Date: 09/01/18  Intake/Output from previous day: 07/31 0701 - 08/01 0700 In: 390 [P.O.:360; I.V.:30] Out: -  Intake/Output this shift: Total I/O In: 240 [P.O.:240] Out: -   General appearance: alert, cooperative, appears stated age and no distress Neck: no adenopathy, no carotid bruit, no JVD, supple, symmetrical, trachea midline and thyroid not enlarged, symmetric, no tenderness/mass/nodules Back: symmetric, no curvature. ROM normal. No CVA tenderness. Resp: clear to auscultation bilaterally Cardio: regular rate and rhythm, S1, S2 normal, no murmur, click, rub or gallop GI: soft, non-tender; bowel sounds normal; no masses,  no organomegaly Extremities: extremities normal, atraumatic, no cyanosis or edema Pulses: 2+ and symmetric Skin: Skin color, texture, turgor normal. No rashes or lesions Neurologic: Grossly normal  Lab Results: Recent Labs    09/02/18 0032  WBC 8.3  HGB 12.4  HCT 36.8  PLT 284   BMET Recent Labs    09/02/18 0032  NA 143  K 4.0  CL 108  CO2 27  GLUCOSE 100*  BUN 10  CREATININE 0.84  CALCIUM 9.7    Studies/Results: No results found.  Medications: I have reviewed the patient's current medications.  Assessment/Plan: A  38 year old female admitted with sickle cell painful crisis.  #1 sickle cell painful crisis: Patient is on maximal therapy at the moment.  She reports only minimal relief.  We will continue with current regimen.  Monitor her H&H.  Continue on supplemental oxygen.  #2 chronic pain syndrome: Patient will be maintained on her home regimen.  She is on OxyContin from home.  Resume Roxicodone at time of discharge.  #3 anemia of chronic disease: H&H appears to be stable.  #4 morbid obesity: Dietary counseling.   LOS: 2 days   Vinisha Faxon,LAWAL 09/04/2018, 2:36 PM

## 2018-09-05 NOTE — Progress Notes (Signed)
Pt stable at this time. No needs at time of transfer to room 1520. rn called report to Jocelyn Lamer RN via telephone prior to d/c. No questions at time of report.

## 2018-09-05 NOTE — Progress Notes (Signed)
Subjective: Patient reports only a modest decrease in her pain.  It is down to 7 out of 10 mainly in the right hip going down her legs.  She has not been out of bed to work.  No fever or chills no nausea vomiting or diarrhea.  She tried getting out of bed and apparently the pain came right back.  She has use 46 mg of the Dilaudid in the last 24 hours.  Has not been using the Roxicodone frequently.  Objective: Vital signs in last 24 hours: Temp:  [97.4 F (36.3 C)-98.6 F (37 C)] 98.5 F (36.9 C) (08/02 1752) Pulse Rate:  [71-99] 85 (08/02 1752) Resp:  [12-18] 12 (08/02 1832) BP: (122-152)/(67-87) 122/71 (08/02 1752) SpO2:  [93 %-100 %] 93 % (08/02 1832) Weight change:  Last BM Date: 09/04/18  Intake/Output from previous day: 08/01 0701 - 08/02 0700 In: 720 [P.O.:720] Out: -  Intake/Output this shift: No intake/output data recorded.  General appearance: alert, cooperative, appears stated age and no distress Neck: no adenopathy, no carotid bruit, no JVD, supple, symmetrical, trachea midline and thyroid not enlarged, symmetric, no tenderness/mass/nodules Back: symmetric, no curvature. ROM normal. No CVA tenderness. Resp: clear to auscultation bilaterally Cardio: regular rate and rhythm, S1, S2 normal, no murmur, click, rub or gallop GI: soft, non-tender; bowel sounds normal; no masses,  no organomegaly Extremities: extremities normal, atraumatic, no cyanosis or edema Pulses: 2+ and symmetric Skin: Skin color, texture, turgor normal. No rashes or lesions Neurologic: Grossly normal  Lab Results: No results for input(s): WBC, HGB, HCT, PLT in the last 72 hours. BMET No results for input(s): NA, K, CL, CO2, GLUCOSE, BUN, CREATININE, CALCIUM in the last 72 hours.  Studies/Results: No results found.  Medications: I have reviewed the patient's current medications.  Assessment/Plan: A 38 year old female admitted with sickle cell painful crisis.  #1 sickle cell painful crisis:  Patient has been encouraged to maximize her PCA use.  Also mobilize patient on the floor.  Schedule her Roxicodone the next 12 hours in preparation for discharge tomorrow.  Monitor her H&H.  Continue on supplemental oxygen.  #2 chronic pain syndrome: Continue OxyContin from home.    #3 anemia of chronic disease: H&H appears to be stable.  #4 morbid obesity: Dietary counseling.   LOS: 3 days   Aydyn Testerman,LAWAL 09/05/2018, 7:40 PM

## 2018-09-05 NOTE — Progress Notes (Addendum)
Wasted 5 ml of Hydromorphone from PCA with Oletha Cruel in the stericycle.  Cecille Aver, RN

## 2018-09-05 NOTE — Progress Notes (Signed)
Pt stable at time of rounding. No needs at this time. Pt continues to be on PCA for pain with intermittent pain pills as well. Pt states she feels she is not ready to go home today.

## 2018-09-06 ENCOUNTER — Other Ambulatory Visit: Payer: Self-pay | Admitting: Family Medicine

## 2018-09-06 DIAGNOSIS — F1190 Opioid use, unspecified, uncomplicated: Secondary | ICD-10-CM

## 2018-09-06 DIAGNOSIS — G894 Chronic pain syndrome: Secondary | ICD-10-CM

## 2018-09-06 DIAGNOSIS — D571 Sickle-cell disease without crisis: Secondary | ICD-10-CM

## 2018-09-06 LAB — COMPREHENSIVE METABOLIC PANEL
ALT: 24 U/L (ref 0–44)
AST: 16 U/L (ref 15–41)
Albumin: 3.7 g/dL (ref 3.5–5.0)
Alkaline Phosphatase: 54 U/L (ref 38–126)
Anion gap: 7 (ref 5–15)
BUN: 12 mg/dL (ref 6–20)
CO2: 30 mmol/L (ref 22–32)
Calcium: 8.8 mg/dL — ABNORMAL LOW (ref 8.9–10.3)
Chloride: 103 mmol/L (ref 98–111)
Creatinine, Ser: 0.62 mg/dL (ref 0.44–1.00)
GFR calc Af Amer: >60 mL/min (ref >60)
GFR calc non Af Amer: >60 mL/min (ref >60)
Glucose, Bld: 85 mg/dL (ref 70–99)
Potassium: 4.1 mmol/L (ref 3.5–5.1)
Sodium: 140 mmol/L (ref 135–145)
Total Bilirubin: 1.4 mg/dL — ABNORMAL HIGH (ref 0.3–1.2)
Total Protein: 6.8 g/dL (ref 6.5–8.1)

## 2018-09-06 LAB — CBC WITH DIFFERENTIAL/PLATELET
Abs Immature Granulocytes: 0.03 10*3/uL (ref 0.00–0.07)
Basophils Absolute: 0.1 10*3/uL (ref 0.0–0.1)
Basophils Relative: 0 %
Eosinophils Absolute: 0.5 10*3/uL (ref 0.0–0.5)
Eosinophils Relative: 5 %
HCT: 33 % — ABNORMAL LOW (ref 36.0–46.0)
Hemoglobin: 10.7 g/dL — ABNORMAL LOW (ref 12.0–15.0)
Immature Granulocytes: 0 %
Lymphocytes Relative: 38 %
Lymphs Abs: 4.2 10*3/uL — ABNORMAL HIGH (ref 0.7–4.0)
MCH: 24.3 pg — ABNORMAL LOW (ref 26.0–34.0)
MCHC: 32.4 g/dL (ref 30.0–36.0)
MCV: 75 fL — ABNORMAL LOW (ref 80.0–100.0)
Monocytes Absolute: 1.1 10*3/uL — ABNORMAL HIGH (ref 0.1–1.0)
Monocytes Relative: 10 %
Neutro Abs: 5.3 10*3/uL (ref 1.7–7.7)
Neutrophils Relative %: 47 %
Platelets: 178 10*3/uL (ref 150–400)
RBC: 4.4 MIL/uL (ref 3.87–5.11)
RDW: 15.7 % — ABNORMAL HIGH (ref 11.5–15.5)
WBC: 11.2 10*3/uL — ABNORMAL HIGH (ref 4.0–10.5)
nRBC: 2 % — ABNORMAL HIGH (ref 0.0–0.2)

## 2018-09-06 MED ORDER — XTAMPZA ER 13.5 MG PO C12A: 13.5000 mg | EXTENDED_RELEASE_CAPSULE | Freq: Two times a day (BID) | ORAL | 0 refills | Status: DC

## 2018-09-06 MED ORDER — OXYCODONE HCL 10 MG PO TABS: ORAL_TABLET | ORAL | 0 refills | Status: DC

## 2018-09-06 NOTE — Discharge Instructions (Signed)
Sickle Cell Anemia, Adult ° °Sickle cell anemia is a condition in which red blood cells have an abnormal “sickle” shape. Red blood cells carry oxygen through the body. Sickle-shaped red blood cells do not live as long as normal red blood cells. They also clump together and block blood from flowing through the blood vessels. This condition prevents the body from getting enough oxygen. Sickle cell anemia causes organ damage and pain. It also increases the risk of infection. °What are the causes? °This condition is caused by a gene that is passed from parent to child (inherited). Receiving two copies of the gene causes the disease. Receiving one copy causes the "trait," which means that symptoms are milder or not present. °What increases the risk? °This condition is more likely to develop if your ancestors were from [place], the Mediterranean, [place] or [place], the [place], [place], or the [place]. °What are the signs or symptoms? °Symptoms of this condition include: °· Episodes of pain (crises), especially in the hands and feet, joints, back, chest, or abdomen. The pain can be triggered by: °? An illness, especially if there is dehydration. °? Doing an activity with great effort (overexertion). °? Exposure to extreme temperature changes. °? High altitude. °· Fatigue. °· Shortness of breath or difficulty breathing. °· Dizziness. °· Pale skin or yellowed skin (jaundice). °· Frequent bacterial infections. °· Pain and swelling in the hands and feet (hand-food syndrome). °· Prolonged, painful erection of the penis (priapism). °· Acute chest syndrome. Symptoms of this include: °? Chest pain. °? Fever. °? Cough. °? Fast breathing. °· Stroke. °· Decreased activity. °· Loss of appetite. °· Change in behavior. °· Headaches. °· Seizures. °· Vision changes. °· Skin ulcers. °· Heart disease. °· High blood pressure. °· Gallstones. °· Liver and kidney problems. °How is this diagnosed? °This condition is diagnosed with  blood tests that check for the gene that causes this condition. °How is this treated? °There is no cure for most cases of this condition. Treatment focuses on managing your symptoms and preventing complications of the disease. Your health care provider will work with you to identify the best treatment options for you based on an assessment of your condition. Treatment may include: °· Medicines, including: °? Pain medicines. °? Antibiotic medicines for infection. °? Medicines to increase the production of a protein in red blood cells that helps carry oxygen in the body (hemoglobin). °· Fluids to treat pain and swelling. °· Oxygen to treat acute chest syndrome. °· Blood transfusions to treat symptoms such as fatigue, stroke, and acute chest syndrome. °· Massage and physical therapy for pain. °· Regular tests to monitor your condition, such as blood tests, X-rays, CT scans, MRI scans, ultrasounds, and lung function tests. These should be done every 3-12 months, depending on your age. °· Hematopoietic stem cell transplant. This is a procedure to replace abnormal stem cells with healthy stem cells from a donor's bone marrow. Stem cells are cells that can develop into blood cells, and bone marrow is the spongy tissue inside the bones. °Follow these instructions at home: °Medicines °· Take over-the-counter and prescription medicines only as told by your health care provider. °· If you were prescribed an antibiotic medicine, take it as told by your health care provider. Do not stop taking the antibiotic even if you start to feel better. °· If you develop a fever, do not take medicines to reduce the fever right away. This could cover up another problem. Notify your health care provider. °Managing   pain, stiffness, and swelling °· Try these methods to help ease your pain: °? Using a heating pad. °? Taking a warm bath. °? Distracting yourself, such as by watching TV. °Eating and drinking °· Drink enough fluid to keep your urine  clear or pale yellow. Drink more in hot weather and during exercise. °· Limit or avoid drinking alcohol. °· Eat a balanced and nutritious diet. Eat plenty of fruits, vegetables, whole grains, and lean protein. °· Take vitamins and supplements as directed by your health care provider. °Traveling °· When traveling, keep these with you: °? Your medical information. °? The names of your health care providers. °? Your medicines. °· If you have to travel by air, ask about precautions you should take. °Activity °· Get plenty of rest. °· Avoid activities that will lower your oxygen levels, such as exercising vigorously. °General instructions °· Do not use any products that contain nicotine or tobacco, such as cigarettes and e-cigarettes. They lower blood oxygen levels. If you need help quitting, ask your health care provider. °· Consider wearing a medical alert bracelet. °· Avoid high altitudes. °· Avoid extreme temperatures and extreme temperature changes. °· Keep all follow-up visits as told by your health care provider. This is important. °Contact a health care provider if: °· You develop joint pain. °· Your feet or hands swell or have pain. °· You have fatigue. °Get help right away if: °· You have symptoms of infection. These include: °? Fever. °? Chills. °? Extreme tiredness. °? Irritability. °? Poor eating. °? Vomiting. °· You feel dizzy or faint. °· You have new abdominal pain, especially on the left side near the stomach area. °· You develop priapism. °· You have numbness in your arms or legs or have trouble moving them. °· You have trouble talking. °· You develop pain that cannot be controlled with medicine. °· You become short of breath. °· You have rapid breathing. °· You have a persistent cough. °· You have pain in your chest. °· You develop a severe headache or stiff neck. °· You feel bloated without eating or after eating a small amount of food. °· Your skin is pale. °· You suddenly lose  vision. °Summary °· Sickle cell anemia is a condition in which red blood cells have an abnormal “sickle” shape. This disease can cause organ damage and chronic pain, and it can raise your risk of infection. °· Sickle cell anemia is a genetic disorder. °· Treatment focuses on managing your symptoms and preventing complications of the disease. °· Get medical help right away if you have any signs of infection, such as a fever. °This information is not intended to replace advice given to you by your health care provider. Make sure you discuss any questions you have with your health care provider. °Document Released: 04/30/2005 Document Revised: 05/14/2018 Document Reviewed: 02/26/2016 °[employer]vier Patient Education © 2020 [employer] ° °

## 2018-09-06 NOTE — Discharge Summary (Signed)
Physician Discharge Summary  Stephanie Burch ZOX:[id number] DOB: 12-Dec-1980 DOA: 09/02/2018  PCP: Kallie LocksStroud, [name], FNP  Admit date: 09/02/2018  Discharge date: 09/06/2018  Discharge Diagnoses:  Principal Problem:   Sickle cell pain crisis [facility](HCC) Active Problems:   Chronic pain syndrome   Discharge Condition: Stable  Disposition:  Follow-up Information    Kallie LocksStroud, [name], FNP. Call in 1 day(s).   Specialty: Family Medicine Why: For Med Refill Contact information: [address] [place] [address] [phone]          Pt is discharged home in good condition and is to follow up with Kallie LocksStroud, [name], FNP this week to have labs evaluated. [name] is instructed to increase activity slowly and balance with rest for the next few days, and use prescribed medication to complete treatment of pain  Diet: Regular Wt Readings from Last 3 Encounters:  09/02/18 126 kg  08/25/18 123.8 kg  04/12/18 127.5 kg    History of present illness:  Stephanie Burch is a 38 y.o. female with medical history significant for sickle cell anemia and chronic pain, now presenting to the emergency department for evaluation of severe pain involving her left arm and bilateral thighs.  Patient reports that her chronic pain had been worse than usual for the past several days, she ran out of her short acting oxycodone, and her pain has become severe.  She describes the symptoms as consistent with her prior pain crises, but little bit more severe than usual.  She denies any recent fevers, chills, cough, or shortness of breath.  She denies chest pain.  ED Course: Upon arrival to the ED, patient is found to be afebrile, saturating well on room air, and with remaining vitals normal.  Chemistry panel is unremarkable and CBC notable for microcytosis without anemia.  Patient was given several doses of Dilaudid in the ED, reports some improvement with this, but continues to complain of severe pain and  hospitalists are consulted for admission.  Hospital Course:  Patient was admitted for sickle cell pain crisis and managed appropriately with IVF, IV Dilaudid via PCA and IV Toradol, as well as other adjunct therapies per sickle cell pain management protocols.  Hemoglobin was stable throughout admission.  Patient was slowly transitioned from Dilaudid IV via PCA to oral medications which was well-tolerated.  Patient slowly began to ambulate well without pain.  Patient also slowly began to tolerate p.o. intake without any restrictions.  Patient remained hemodynamically stable throughout this admission.  She did not require any blood transfusion.  With this improvement, patient was discharged home today in a hemodynamically stable condition.  She will follow-up with her PCP in the clinic within 1 week of this discharge.  She is due for refill of her prescription pain medication, discussed with her PCP who I agrees to send refill to the pharmacy.  Discharge Exam: Vitals:   09/06/18 0814 09/06/18 0944  BP: 121/68   Pulse: 85   Resp:  11  Temp: 97.7 F (36.5 C)   SpO2: 96% 95%   Vitals:   09/06/18 0328 09/06/18 0433 09/06/18 0814 09/06/18 0944  BP:  (!) 127/57 121/68   Pulse:  84 85   Resp: 15 16  11   Temp:  97.7 F (36.5 C) 97.7 F (36.5 C)   TempSrc:  Oral Oral   SpO2: 96% 98% 96% 95%  Weight:      Height:        General appearance : Awake, alert, not  in any distress. Speech Clear. Not toxic looking HEENT: Atraumatic and Normocephalic, pupils equally reactive to light and accomodation Neck: Supple, no JVD. No cervical lymphadenopathy.  Chest: Good air entry bilaterally, no added sounds  CVS: S1 S2 regular, no murmurs.  Abdomen: Bowel sounds present, Non tender and not distended with no gaurding, rigidity or rebound. Extremities: B/L Lower Ext shows no edema, both legs are warm to touch Neurology: Awake alert, and oriented X 3, CN II-XII intact, Non focal Skin: No Rash  Discharge  Instructions  Discharge Instructions    Diet - low sodium heart healthy   Complete by: As directed    Increase activity slowly   Complete by: As directed      Allergies as of 09/06/2018   No Known Allergies     Medication List    TAKE these medications   acetaminophen 500 MG tablet Commonly known as: TYLENOL Take 500 mg by mouth every 6 (six) hours as needed for mild pain.   docusate sodium 100 MG capsule Commonly known as: Colace Take 1 capsule (100 mg total) by mouth 2 (two) times daily.   folic acid 1 MG tablet Commonly known as: FOLVITE Take 1 tablet (1 mg total) by mouth daily.   hydroxyurea 500 MG capsule Commonly known as: Hydrea Take 4 capsules (2,000 mg total) by mouth daily. May take with food to minimize GI side effects.   ibuprofen 800 MG tablet Commonly known as: ADVIL Take 1 tablet (800 mg total) by mouth every 8 (eight) hours as needed for headache, mild pain, moderate pain or cramping.   ondansetron 4 MG tablet Commonly known as: Zofran Take 1 tablet (4 mg total) by mouth every 8 (eight) hours as needed for nausea or vomiting.   Oxbryta 500 MG Tabs tablet Generic drug: voxelotor Take 1,500 mg by mouth daily. For Sickle Cell Anemia   Oxycodone HCl 10 MG Tabs Take 2 tablets (total dose = 20 mg), every 4 hours as needed for pain. Start taking on: September 07, 2018   tiZANidine 4 MG tablet Commonly known as: Zanaflex Take 1 tablet every 4 hours as needed. What changed:   how much to take  how to take this  when to take this  reasons to take this   Vitamin D (Ergocalciferol) 1.25 MG (50000 UT) Caps capsule Commonly known as: DRISDOL Take 1 capsule (50,000 Units total) by mouth every 7 (seven) days.   Xtampza ER 13.5 MG C12a Generic drug: oxyCODONE ER Take 13.5 mg by mouth every 12 (twelve) hours. Start taking on: September 10, 2018 What changed: These instructions start on September 10, 2018. If you are unsure what to do until then, ask your doctor  or other care provider.       The results of significant diagnostics from this hospitalization (including imaging, microbiology, ancillary and laboratory) are listed below for reference.    Significant Diagnostic Studies: No results found.  Microbiology: Recent Results (from the past 240 hour(s))  SARS Coronavirus 2 (CEPHEID - Performed [facility] lab), Hosp Order     Status: None   Collection Time: 09/02/18  6:00 PM   Specimen: Nasopharyngeal Swab  Result Value Ref Range Status   SARS Coronavirus 2 NEGATIVE NEGATIVE Final    Comment: (NOTE) If result is NEGATIVE SARS-CoV-2 target nucleic acids are NOT DETECTED. The SARS-CoV-2 RNA is generally detectable in upper and lower  respiratory specimens during the acute phase of infection. The lowest  concentration of SARS-CoV-2  viral copies this assay can detect is 250  copies / mL. A negative result does not preclude SARS-CoV-2 infection  and should not be used as the sole basis for treatment or other  patient management decisions.  A negative result may occur with  improper specimen collection / handling, submission of specimen other  than nasopharyngeal swab, presence of viral mutation(s) within the  areas targeted by this assay, and inadequate number of viral copies  (<250 copies / mL). A negative result must be combined with clinical  observations, patient history, and epidemiological information. If result is POSITIVE SARS-CoV-2 target nucleic acids are DETECTED. The SARS-CoV-2 RNA is generally detectable in upper and lower  respiratory specimens dur ing the acute phase of infection.  Positive  results are indicative of active infection with SARS-CoV-2.  Clinical  correlation with patient history and other diagnostic information is  necessary to determine patient infection status.  Positive results do  not rule out bacterial infection or co-infection with other viruses. If result is PRESUMPTIVE POSTIVE SARS-CoV-2  nucleic acids MAY BE PRESENT.   A presumptive positive result was obtained on the submitted specimen  and confirmed on repeat testing.  While 2019 novel coronavirus  (SARS-CoV-2) nucleic acids may be present in the submitted sample  additional confirmatory testing may be necessary for epidemiological  and / or clinical management purposes  to differentiate between  SARS-CoV-2 and other Sarbecovirus currently known to infect humans.  If clinically indicated additional testing with an alternate test  methodology [id number]) is advised. The SARS-CoV-2 RNA is generally  detectable in upper and lower respiratory sp ecimens during the acute  phase of infection. The expected result is Negative. Fact Sheet for Patients:  StrictlyIdeas.no Fact Sheet for Healthcare Providers: BankingDealers.co.za This test is not yet approved or cleared by the [place] FDA and has been authorized for detection and/or diagnosis of SARS-CoV-2 by FDA under an Emergency Use Authorization (EUA).  This EUA will remain in effect (meaning this test can be used) for the duration of the COVID-19 declaration under Section 564(b)(1) of the Act, 21 U.S.C. section 360bbb-3(b)(1), unless the authorization is terminated or revoked sooner. Performed at [facility], [place] [address], [place], [place] [address]      Labs: Basic Metabolic Panel: Recent Labs  Lab 09/02/18 0032 09/06/18 0604  NA 143 140  K 4.0 4.1  CL 108 103  CO2 27 30  GLUCOSE 100* 85  BUN 10 12  CREATININE 0.84 0.62  CALCIUM 9.7 8.8*   Liver Function Tests: Recent Labs  Lab 09/02/18 0032 09/06/18 0604  AST 11* 16  ALT 15 24  ALKPHOS 53 54  BILITOT 0.9 1.4*  PROT 7.2 6.8  ALBUMIN 4.0 3.7   No results for input(s): LIPASE, AMYLASE in the last 168 hours. No results for input(s): AMMONIA in the last 168 hours. CBC: Recent Labs  Lab 09/02/18 0032 09/06/18 0604  WBC 8.3  11.2*  NEUTROABS 5.2 5.3  HGB 12.4 10.7*  HCT 36.8 33.0*  MCV 73.5* 75.0*  PLT 284 178   Cardiac Enzymes: No results for input(s): CKTOTAL, CKMB, CKMBINDEX, TROPONINI in the last 168 hours. BNP: Invalid input(s): POCBNP CBG: No results for input(s): GLUCAP in the last 168 hours.  Time coordinating discharge: 50 minutes  Signed:  [facility] 09/06/2018, 11:21 AM

## 2018-09-06 NOTE — Progress Notes (Signed)
Discharge instructions explained to patient, denies having any questions. IV discontinued. Prescriptions called in to [facility] on [address]. Patient drove herself to the hospital and will drive herself home. Discharge via wheelchair.

## 2018-09-10 ENCOUNTER — Telehealth: Payer: Self-pay

## 2018-09-10 ENCOUNTER — Other Ambulatory Visit: Payer: Self-pay

## 2018-09-10 DIAGNOSIS — K5903 Drug induced constipation: Secondary | ICD-10-CM

## 2018-09-10 MED ORDER — DOCUSATE SODIUM 100 MG PO CAPS: 100.0000 mg | ORAL_CAPSULE | Freq: Two times a day (BID) | ORAL | 0 refills | Status: DC

## 2018-09-10 NOTE — Telephone Encounter (Signed)
Medication sent to pharmacy  

## 2018-09-13 ENCOUNTER — Other Ambulatory Visit: Payer: Self-pay | Admitting: Family Medicine

## 2018-09-13 DIAGNOSIS — M62838 Other muscle spasm: Secondary | ICD-10-CM

## 2018-09-14 ENCOUNTER — Non-Acute Institutional Stay (HOSPITAL_COMMUNITY)
Admission: AD | Admit: 2018-09-14 | Discharge: 2018-09-14 | Disposition: A | Discharge status: Home / Self Care | Payer: Medicaid Other | Source: Ambulatory Visit | Attending: Internal Medicine | Admitting: Internal Medicine

## 2018-09-14 ENCOUNTER — Encounter: Payer: Self-pay | Admitting: Family Medicine

## 2018-09-14 ENCOUNTER — Other Ambulatory Visit: Payer: Self-pay

## 2018-09-14 ENCOUNTER — Ambulatory Visit (INDEPENDENT_AMBULATORY_CARE_PROVIDER_SITE_OTHER): Payer: Medicaid Other | Admitting: Family Medicine

## 2018-09-14 VITALS — BP 136/92 | HR 96 | Temp 98.5°F | Ht 65.0 in | Wt 272.0 lb

## 2018-09-14 DIAGNOSIS — Z832 Family history of diseases of the blood and blood-forming organs and certain disorders involving the immune mechanism: Secondary | ICD-10-CM | POA: Insufficient documentation

## 2018-09-14 DIAGNOSIS — Z79899 Other long term (current) drug therapy: Secondary | ICD-10-CM | POA: Insufficient documentation

## 2018-09-14 DIAGNOSIS — D5700 Hb-SS disease with crisis, unspecified: Secondary | ICD-10-CM | POA: Insufficient documentation

## 2018-09-14 DIAGNOSIS — D571 Sickle-cell disease without crisis: Secondary | ICD-10-CM | POA: Diagnosis not present

## 2018-09-14 DIAGNOSIS — F1190 Opioid use, unspecified, uncomplicated: Secondary | ICD-10-CM

## 2018-09-14 DIAGNOSIS — Z791 Long term (current) use of non-steroidal anti-inflammatories (NSAID): Secondary | ICD-10-CM | POA: Diagnosis not present

## 2018-09-14 DIAGNOSIS — Z09 Encounter for follow-up examination after completed treatment for conditions other than malignant neoplasm: Secondary | ICD-10-CM | POA: Diagnosis not present

## 2018-09-14 DIAGNOSIS — G894 Chronic pain syndrome: Secondary | ICD-10-CM

## 2018-09-14 DIAGNOSIS — R110 Nausea: Secondary | ICD-10-CM

## 2018-09-14 LAB — COMPREHENSIVE METABOLIC PANEL
ALT: 20 U/L (ref 0–44)
AST: 17 U/L (ref 15–41)
Albumin: 4.3 g/dL (ref 3.5–5.0)
Alkaline Phosphatase: 62 U/L (ref 38–126)
Anion gap: 8 (ref 5–15)
BUN: 8 mg/dL (ref 6–20)
CO2: 24 mmol/L (ref 22–32)
Calcium: 9.3 mg/dL (ref 8.9–10.3)
Chloride: 108 mmol/L (ref 98–111)
Creatinine, Ser: 0.6 mg/dL (ref 0.44–1.00)
GFR calc Af Amer: >60 mL/min (ref >60)
GFR calc non Af Amer: >60 mL/min (ref >60)
Glucose, Bld: 100 mg/dL — ABNORMAL HIGH (ref 70–99)
Potassium: 3.7 mmol/L (ref 3.5–5.1)
Sodium: 140 mmol/L (ref 135–145)
Total Bilirubin: 1.1 mg/dL (ref 0.3–1.2)
Total Protein: 7.8 g/dL (ref 6.5–8.1)

## 2018-09-14 LAB — POCT URINALYSIS DIP (MANUAL ENTRY)
Bilirubin, UA: NEGATIVE
Blood, UA: NEGATIVE
Glucose, UA: NEGATIVE mg/dL
Ketones, POC UA: NEGATIVE mg/dL
Leukocytes, UA: NEGATIVE
Nitrite, UA: NEGATIVE
Protein Ur, POC: NEGATIVE mg/dL
Spec Grav, UA: 1.02 (ref 1.010–1.025)
Urobilinogen, UA: 1 E.U./dL
pH, UA: 6 (ref 5.0–8.0)

## 2018-09-14 LAB — CBC
HCT: 34.2 % — ABNORMAL LOW (ref 36.0–46.0)
Hemoglobin: 11.7 g/dL — ABNORMAL LOW (ref 12.0–15.0)
MCH: 25.1 pg — ABNORMAL LOW (ref 26.0–34.0)
MCHC: 34.2 g/dL (ref 30.0–36.0)
MCV: 73.2 fL — ABNORMAL LOW (ref 80.0–100.0)
Platelets: 157 10*3/uL (ref 150–400)
RBC: 4.67 MIL/uL (ref 3.87–5.11)
RDW: 14.6 % (ref 11.5–15.5)
WBC: 6 10*3/uL (ref 4.0–10.5)
nRBC: 0 % (ref 0.0–0.2)

## 2018-09-14 LAB — PREGNANCY, URINE: Preg Test, Ur: NEGATIVE

## 2018-09-14 MED ORDER — SODIUM CHLORIDE 0.9% FLUSH: 9.0000 mL | INTRAVENOUS | Status: DC | PRN

## 2018-09-14 MED ORDER — POLYETHYLENE GLYCOL 3350 17 G PO PACK: 17.0000 g | PACK | Freq: Every day | ORAL | Status: DC | PRN

## 2018-09-14 MED ORDER — TIZANIDINE HCL 4 MG PO CAPS: 4.0000 mg | ORAL_CAPSULE | Freq: Three times a day (TID) | ORAL | 1 refills | Status: DC

## 2018-09-14 MED ORDER — NALOXONE HCL 0.4 MG/ML IJ SOLN: INTRAMUSCULAR | 0 refills | Status: DC

## 2018-09-14 MED ORDER — DIPHENHYDRAMINE HCL 25 MG PO CAPS: 25.0000 mg | ORAL_CAPSULE | ORAL | Status: DC | PRN

## 2018-09-14 MED ORDER — SODIUM CHLORIDE 0.9 % IV SOLN
25.0000 mg | INTRAVENOUS | Status: DC | PRN
  Filled 2018-09-14: qty 0.5

## 2018-09-14 MED ORDER — KETOROLAC TROMETHAMINE 15 MG/ML IJ SOLN
15.0000 mg | Freq: Once | INTRAMUSCULAR | Status: AC
  Administered 2018-09-14: 15 mg via INTRAVENOUS
  Filled 2018-09-14: qty 1

## 2018-09-14 MED ORDER — ONDANSETRON HCL 4 MG/2ML IJ SOLN
4.0000 mg | Freq: Four times a day (QID) | INTRAMUSCULAR | Status: DC | PRN
  Administered 2018-09-14: 12:00:00 4 mg via INTRAVENOUS
  Filled 2018-09-14: qty 2

## 2018-09-14 MED ORDER — HYDROMORPHONE 1 MG/ML IV SOLN
INTRAVENOUS | Status: DC
  Administered 2018-09-14: 30 mg via INTRAVENOUS
  Administered 2018-09-14: 10 mg via INTRAVENOUS
  Filled 2018-09-14: qty 30

## 2018-09-14 MED ORDER — NALOXONE HCL 0.4 MG/ML IJ SOLN: 0.4000 mg | INTRAMUSCULAR | Status: DC | PRN

## 2018-09-14 MED ORDER — DEXTROSE-NACL 5-0.45 % IV SOLN
INTRAVENOUS | Status: DC
  Administered 2018-09-14: 11:00:00 via INTRAVENOUS

## 2018-09-14 MED ORDER — SENNOSIDES-DOCUSATE SODIUM 8.6-50 MG PO TABS: 1.0000 | ORAL_TABLET | Freq: Two times a day (BID) | ORAL | Status: DC

## 2018-09-14 NOTE — Discharge Instructions (Signed)
Sickle Cell Anemia, Adult ° °Sickle cell anemia is a condition where your red blood cells are shaped like sickles. Red blood cells carry oxygen through the body. Sickle-shaped cells do not live as long as normal red blood cells. They also clump together and block blood from flowing through the blood vessels. This prevents the body from getting enough oxygen. Sickle cell anemia causes organ damage and pain. It also increases the risk of infection. °Follow these instructions at home: °Medicines °· Take over-the-counter and prescription medicines only as told by your doctor. °· If you were prescribed an antibiotic medicine, take it as told by your doctor. Do not stop taking the antibiotic even if you start to feel better. °· If you develop a fever, do not take medicines to lower the fever right away. Tell your doctor about the fever. °Managing pain, stiffness, and swelling °· Try these methods to help with pain: °? Use a heating pad. °? Take a warm bath. °? Distract yourself, such as by watching TV. °Eating and drinking °· Drink enough fluid to keep your pee (urine) clear or pale yellow. Drink more in hot weather and during exercise. °· Limit or avoid alcohol. °· Eat a healthy diet. Eat plenty of fruits, vegetables, whole grains, and lean protein. °· Take vitamins and supplements as told by your doctor. °Traveling °· When traveling, keep these with you: °? Your medical information. °? The names of your doctors. °? Your medicines. °· If you need to take an airplane, talk to your doctor first. °Activity °· Rest often. °· Avoid exercises that make your heart beat much faster, such as jogging. °General instructions °· Do not use products that have nicotine or tobacco, such as cigarettes and e-cigarettes. If you need help quitting, ask your doctor. °· Consider wearing a medical alert bracelet. °· Avoid being in high places (high altitudes), such as mountains. °· Avoid very hot or cold temperatures. °· Avoid places where the  temperature changes a lot. °· Keep all follow-up visits as told by your doctor. This is important. °Contact a doctor if: °· A joint hurts. °· Your feet or hands hurt or swell. °· You feel tired (fatigued). °Get help right away if: °· You have symptoms of infection. These include: °? Fever. °? Chills. °? Being very tired. °? Irritability. °? Poor eating. °? Throwing up (vomiting). °· You feel dizzy or faint. °· You have new stomach pain, especially on the left side. °· You have a an erection (priapism) that lasts more than 4 hours. °· You have numbness in your arms or legs. °· You have a hard time moving your arms or legs. °· You have trouble talking. °· You have pain that does not go away when you take medicine. °· You are short of breath. °· You are breathing fast. °· You have a long-term cough. °· You have pain in your chest. °· You have a bad headache. °· You have a stiff neck. °· Your stomach looks bloated even though you did not eat much. °· Your skin is pale. °· You suddenly cannot see well. °Summary °· Sickle cell anemia is a condition where your red blood cells are shaped like sickles. °· Follow your doctor's advice on ways to manage pain, food to eat, activities to do, and steps to take for safe travel. °· Get medical help right away if you have any signs of infection, such as a fever. °This information is not intended to replace advice given to you by   your health care provider. Make sure you discuss any questions you have with your health care provider. °Document Released: 11/10/2012 Document Revised: 05/14/2018 Document Reviewed: 02/26/2016 °Elsevier Patient Education © 2020 [employer] Inc. ° °

## 2018-09-14 NOTE — H&P (Signed)
[facility] History and Physical  Stephanie StarchKimaada Dymond ZOX:[id number] DOB: 01-10-1981 DOA: 09/14/2018  PCP: Kallie LocksStroud, [name], FNP   Chief Complaint: Sickle cell pain  HPI: Stephanie Burch is a 38 y.o. female with history of sickle cell disease, hemoglobin [place], chronic pain syndrome, morbid obesity who initially came in for her routine follow-up visit at the clinic today and was transitioned to the day hospital because of complaints of pain that is typical of her sickle cell pain crisis. Patient states that pain increased overnight and was uncontrollable with her home pain medications. Patient states she has taken more medication than her normally prescribed dosage because of the increased pain. She is now requesting for an early refill of her medication. She rates her pain at 9/10 characterized as throbbing and constant. She denies any fever, chills, shortness of breath, cough, headache, dizziness, dysuria, vomiting or diarrhea.  Systemic Review: General: The patient denies anorexia, fever, weight loss Cardiac: Denies chest pain, syncope, palpitations, pedal edema  Respiratory: Denies cough, shortness of breath, wheezing GI: Denies severe indigestion/heartburn, abdominal pain, nausea, vomiting, diarrhea and constipation GU: Denies hematuria, incontinence, dysuria  Musculoskeletal: Denies arthritis  Skin: Denies suspicious skin lesions Neurologic: Denies focal weakness or numbness, change in vision  Past Medical History:  Diagnosis Date  . Sickle cell anemia (HCC)     No past surgical history on file.  No Known Allergies  Family History  Problem Relation Age of Onset  . Hypertension Mother   . Sickle cell trait Mother   . Diabetes Father   . Sickle cell trait Father       Prior to Admission medications   Medication Sig Start Date End Date Taking? Authorizing Provider  acetaminophen (TYLENOL) 500 MG tablet Take 500 mg by mouth every 6 (six) hours as needed for mild pain.      [provider]  docusate sodium (COLACE) 100 MG capsule Take 1 capsule (100 mg total) by mouth 2 (two) times daily. 09/10/18   Kallie LocksStroud, [name], FNP  folic acid (FOLVITE) 1 MG tablet Take 1 tablet (1 mg total) by mouth daily. 07/14/18   Kallie LocksStroud, [name], FNP  hydroxyurea (HYDREA) 500 MG capsule Take 4 capsules (2,000 mg total) by mouth daily. May take with food to minimize GI side effects. Patient not taking: Reported on 08/25/2018 02/16/18   Kallie LocksStroud, [name], FNP  ibuprofen (ADVIL,MOTRIN) 800 MG tablet Take 1 tablet (800 mg total) by mouth every 8 (eight) hours as needed for headache, mild pain, moderate pain or cramping. 01/13/18   Kallie LocksStroud, [name], FNP  naloxone [facility](NARCAN) 0.4 MG/ML injection As needed 09/14/18   Kallie LocksStroud, [name], FNP  ondansetron (ZOFRAN) 4 MG tablet Take 1 tablet (4 mg total) by mouth every 8 (eight) hours as needed for nausea or vomiting. 07/17/18   Kallie LocksStroud, [name], FNP  oxyCODONE ER [facility](XTAMPZA ER) 13.5 MG C12A Take 13.5 mg by mouth every 12 (twelve) hours. 09/06/18 10/06/18  Kallie LocksStroud, [name], FNP  Oxycodone HCl 10 MG TABS Take 2 tablets (total dose = 20 mg), every 4 hours as needed for pain. 09/06/18   Kallie LocksStroud, [name], FNP  tiZANidine (ZANAFLEX) 4 MG capsule Take 1 capsule (4 mg total) by mouth 3 (three) times daily. 09/14/18   Kallie LocksStroud, [name], FNP  Vitamin D, Ergocalciferol, (DRISDOL) 50000 units CAPS capsule Take 1 capsule (50,000 Units total) by mouth every 7 (seven) days. 12/02/17   Kallie LocksStroud, [name], FNP  voxelotor (OXBRYTA) 500 MG TABS  tablet Take 1,500 mg by mouth daily. For Sickle Cell Anemia    [provider]     Physical Exam: Vitals:   09/14/18 0904 09/14/18 1148  BP: 123/81 133/77  Pulse: 98 97  Temp: 99.6 F (37.6 C)   TempSrc: Oral   SpO2: 100% 100%    General: Alert, awake, afebrile, anicteric, not in obvious distress HEENT: Normocephalic and Atraumatic, Mucous membranes pink                PERRLA; EOM intact; No scleral icterus,                  Nares: Patent, Oropharynx: Clear, Fair Dentition                 Neck: FROM, no cervical lymphadenopathy, thyromegaly, carotid bruit or JVD;  CHEST WALL: No tenderness  CHEST: Normal respiration, clear to auscultation bilaterally  HEART: Regular rate and rhythm; no murmurs rubs or gallops  BACK: No kyphosis or scoliosis; no CVA tenderness  ABDOMEN: Positive Bowel Sounds, soft, non-tender; no masses, no organomegaly EXTREMITIES: No cyanosis, clubbing, or edema SKIN:  no rash or ulceration  CNS: Alert and Oriented x 4, Nonfocal exam, CN 2-12 intact  Labs on Admission:  Basic Metabolic Panel: No results for input(s): NA, K, CL, CO2, GLUCOSE, BUN, CREATININE, CALCIUM, MG, PHOS in the last 168 hours. Liver Function Tests: No results for input(s): AST, ALT, ALKPHOS, BILITOT, PROT, ALBUMIN in the last 168 hours. No results for input(s): LIPASE, AMYLASE in the last 168 hours. No results for input(s): AMMONIA in the last 168 hours. CBC: No results for input(s): WBC, NEUTROABS, HGB, HCT, MCV, PLT in the last 168 hours. Cardiac Enzymes: No results for input(s): CKTOTAL, CKMB, CKMBINDEX, TROPONINI in the last 168 hours.  BNP (last 3 results) No results for input(s): BNP in the last 8760 hours.  ProBNP (last 3 results) No results for input(s): PROBNP in the last 8760 hours.  CBG: No results for input(s): GLUCAP in the last 168 hours.  Assessment/Plan Active Problems:   Sickle cell anemia with crisis ([place])   Admits to the Day Hospital  IVF D5 .45% Saline @ 125 mls/hour  Weight based Dilaudid PCA started within 30 minutes of admission  IV Toradol 15 mg Q 6 H  Monitor vitals very closely, Re-evaluate pain scale every hour  2 L of Oxygen by [place]  Patient will be re-evaluated for pain in the context of function and relationship to baseline as care progresses.  If no significant relieve from pain (remains above 5/10) will transfer patient to inpatient services for further  evaluation and management  Code Status: Full  Family Communication: None  DVT Prophylaxis: Ambulate as tolerated   Time spent: 35 Minutes  Angelica Chessman, MD, MHA, FACP, FAAP, CPE  If 7PM-7AM, please contact night-coverage [email or web address] 09/14/2018, 11:53 AM

## 2018-09-14 NOTE — Discharge Summary (Signed)
Physician Discharge Summary  Stephanie Burch JXB:[id number] DOB: 1981-01-26 DOA: 09/14/2018  PCP: Kallie LocksStroud, [name], FNP  Admit date: 09/14/2018  Discharge date: 09/14/2018  Time spent: 30 minutes  Discharge Diagnoses:  Active Problems:   Sickle cell anemia with crisis [facility](HCC)  Discharge Condition: Stable  Diet recommendation: Regular  History of present illness:  Stephanie Burch is a 38 y.o. female with history of sickle cell disease, hemoglobin [place], chronic pain syndrome, morbid obesity who initially came in for her routine follow-up visit at the clinic today and was transitioned to the day hospital because of complaints of pain that is typical of her sickle cell pain crisis. Patient states that pain increased overnight and was uncontrollable with her home pain medications. Patient states she has taken more medication than her normally prescribed dosage because of the increased pain. She is now requesting for an early refill of her medication. She rates her pain at 9/10 characterized as throbbing and constant. She denies any fever, chills, shortness of breath, cough, headache, dizziness, dysuria, vomiting or diarrhea.  Hospital Course:  Stephanie Burch was admitted to the day hospital with sickle cell painful crisis. Patient was treated with weight based IV Dilaudid PCA, IV Toradol, clinician assisted doses as deemed appropriate and IV fluids. Stephanie Burch showed significant improvement symptomatically, pain improved from 9 to 5/10 at the time of discharge. Patient was discharged home in a hemodynamically stable condition. Stephanie ManKimaada will follow-up at the clinic as previously scheduled, continue with home medications as per prior to admission.  Discharge Instructions We discussed the need for good hydration, monitoring of hydration status, avoidance of heat, cold, stress, and infection triggers. We discussed the need to be compliant with taking Hydrea and other home medications. Stephanie ManKimaada was reminded of the  need to seek medical attention immediately if any symptom of bleeding, anemia, or infection occurs.  Discharge Exam: Vitals:   09/14/18 1337 09/14/18 1538  BP: 124/72 128/69  Pulse: (!) 107 95  Temp: 98.4 F (36.9 C)   SpO2: 100%     General appearance: alert, cooperative and no distress Eyes: conjunctivae/corneas clear. PERRL, EOM's intact. Fundi benign. Neck: no adenopathy, no carotid bruit, no JVD, supple, symmetrical, trachea midline and thyroid not enlarged, symmetric, no tenderness/mass/nodules Back: symmetric, no curvature. ROM normal. No CVA tenderness. Resp: clear to auscultation bilaterally Chest wall: no tenderness Cardio: regular rate and rhythm, S1, S2 normal, no murmur, click, rub or gallop GI: soft, non-tender; bowel sounds normal; no masses,  no organomegaly Extremities: extremities normal, atraumatic, no cyanosis or edema Pulses: 2+ and symmetric Skin: Skin color, texture, turgor normal. No rashes or lesions Neurologic: Grossly normal  Discharge Instructions    Diet - low sodium heart healthy   Complete by: As directed    Increase activity slowly   Complete by: As directed      Allergies as of 09/14/2018   No Known Allergies     Medication List    TAKE these medications   acetaminophen 500 MG tablet Commonly known as: TYLENOL Take 500 mg by mouth every 6 (six) hours as needed for mild pain.   docusate sodium 100 MG capsule Commonly known as: Colace Take 1 capsule (100 mg total) by mouth 2 (two) times daily.   folic acid 1 MG tablet Commonly known as: FOLVITE Take 1 tablet (1 mg total) by mouth daily.   hydroxyurea 500 MG capsule Commonly known as: Hydrea Take 4 capsules (2,000 mg total) by mouth daily. May take with food to minimize  GI side effects.   ibuprofen 800 MG tablet Commonly known as: ADVIL Take 1 tablet (800 mg total) by mouth every 8 (eight) hours as needed for headache, mild pain, moderate pain or cramping.   naloxone 0.4 MG/ML  injection Commonly known as: NARCAN As needed   ondansetron 4 MG tablet Commonly known as: Zofran Take 1 tablet (4 mg total) by mouth every 8 (eight) hours as needed for nausea or vomiting.   Oxbryta 500 MG Tabs tablet Generic drug: voxelotor Take 1,500 mg by mouth daily. For Sickle Cell Anemia   Oxycodone HCl 10 MG Tabs Take 2 tablets (total dose = 20 mg), every 4 hours as needed for pain.   tiZANidine 4 MG capsule Commonly known as: Zanaflex Take 1 capsule (4 mg total) by mouth 3 (three) times daily.   Vitamin D (Ergocalciferol) 1.25 MG (50000 UT) Caps capsule Commonly known as: DRISDOL Take 1 capsule (50,000 Units total) by mouth every 7 (seven) days.   Xtampza ER 13.5 MG C12a Generic drug: oxyCODONE ER Take 13.5 mg by mouth every 12 (twelve) hours.      No Known Allergies   Significant Diagnostic Studies: No results found.  Signed:  Angelica Chessman MD, MHA, [place], Julio Sicks, CPE   09/14/2018, 4:05 PM

## 2018-09-14 NOTE — Patient Instructions (Signed)
Naloxone injection What is this medicine? NALOXONE (nal OX one) is a narcotic blocker. It is used to treat narcotic (opioid) drug overdose. It is used to temporarily reverse the effects of opioid medicines. This medicine has no effect in people who are not taking opioid medicines. This medicine may be used for other purposes; ask your health care provider or pharmacist if you have questions. COMMON BRAND NAME(S): EVZIO, Narcan What should I tell my health care provider before I take this medicine? They need to know if you have any of these conditions:  drug abuse or addiction  heart disease  an unusual or allergic reaction to naloxone, other medicines, foods, dyes, or preservatives  pregnant or trying to get pregnantbreast-feeding How should I use this medicine? This medicine may be administered in a hospital, clinic, or can be used by the public to give aid to a person who has overdosed until emergency medical help is available. This medicine is for injection into the outer thigh. It can be injected through clothing if needed. Get emergency medical help right away after giving the first dose of this medicine, even if the person wakes up. You should be familiar with how to recognize the signs and symptoms of a narcotic overdose. Administer according to the printed instructions on the device label or the electronic voice instructions. You should practice using the Trainer injector before this medicine is needed. Talk to your pediatrician regarding the use of this medicine in children. While this drug may be prescribed for children as young as newborn for selected conditions, precautions do apply. For infants less than 1 year of age, pinch the thigh muscle while administering. Overdosage: If you think you have taken too much of this medicine contact a poison control center or emergency room at once. NOTE: This medicine is only for you. Do not share this medicine with others. What if I miss a  dose? This does not apply. What may interact with this medicine? This medicine is only used during an emergency. No interactions are expected during emergency use. This list may not describe all possible interactions. Give your health care provider a list of all the medicines, herbs, non-prescription drugs, or dietary supplements you use. Also tell them if you smoke, drink alcohol, or use illegal drugs. Some items may interact with your medicine. What should I watch for while using this medicine? Keep this medicine ready for use in the case of a narcotic overdose. Make sure that you have the phone number of your doctor or health care professional and local hospital ready. You may need to have additional doses of this medicine. Each injector contains a single dose. Some emergencies may require additional doses. After use, bring the treated person to the nearest hospital or call 911. Make sure the treating health care professional knows that the person has received an injection of this medicine. You will receive additional instructions on what to do during and after use of this medicine before an emergency occurs. What side effects may I notice from receiving this medicine? Side effects that you should report to your doctor or health care professional as soon as possible:  allergic reactions like skin rash, itching or hives, swelling of the face, lips, or tongue  breathing problems  fast, irregular heartbeat  high blood pressure  pain that was controlled by narcotic pain medicine  seizures Side effects that usually do not require medical attention (report to your doctor or health care professional if they continue or are  bothersome):  anxious  chills  diarrhea  fever  nausea, vomiting  sweating This list may not describe all possible side effects. Call your doctor for medical advice about side effects. You may report side effects to FDA at 1-800-FDA-1088. Where should I keep my  medicine? Keep out of the reach of children. Store at room temperature between 15 and 25 degrees C (59 and 77 degrees F). If you are using this medicine at home, you will be instructed on how to store this medicine. Keep this medicine in its outer case until ready to use. Occasionally check the solution through the viewing window of the injector. The solution should be clear. If it is discolored, cloudy, or contains solid particles, replace it with a new injector. Remember to check the expiration date of this medicine regularly. Throw away any unused medicine after the expiration date. NOTE: This sheet is a summary. It may not cover all possible information. If you have questions about this medicine, talk to your doctor, pharmacist, or health care provider.  2020 Elsevier/Gold Standard (2015-01-30 16:45:38)

## 2018-09-14 NOTE — Progress Notes (Signed)
. [facility]  Hospital Follow Up  Subjective:  Patient ID: Stephanie StarchKimaada Sluka, female    DOB: [birth year]  Age: 38 y.o. MRN: [id number]  CC:  Chief Complaint  Patient presents with  . Follow-up    sickle cell    HPI Stephanie Burch is a 38 year old female who presents for follow up today.   Past Medical History:  Diagnosis Date  . Sickle cell anemia (HCC)    Current Status: Since her last office visit, states that pain medications are not controlling her pain, for about 1 month now. She states that she is currently taking more Oxycodone and Oxycontin then usual for pain control. She states that she has pain in her arms and legs. She rates her pain today at 8/10. She has not had a hospital visit for Sickle Cell Crisis since 09/02/2018 where she was treated and discharged on 09/07/2018. She has not taken Hydrea lately, because of side effects. She is currently taking all other medications prescribed and staying well hydrated. She reports occasional nausea, constipation, dizziness and headaches.   She denies fevers, chills, recent infections, weight loss, and night sweats. She has not had any visual changes, and falls. No chest pain, heart palpitations, cough and shortness of breath reported. No reports of GI problems such as vomiting, and diarrhea. She has no reports of blood in stools, dysuria and hematuria. No depression or anxiety reported.  History reviewed. No pertinent surgical history.  Family History  Problem Relation Age of Onset  . Hypertension Mother   . Sickle cell trait Mother   . Diabetes Father   . Sickle cell trait Father     Social History   Socioeconomic History  . Marital status: Single    Spouse name: Not on file  . Number of children: Not on file  . Years of education: Not on file  . Highest education level: Not on file  Occupational History  . Not on file  Social Needs  . Financial resource strain: Not on file   . Food insecurity    Worry: Not on file    Inability: Not on file  . Transportation needs    Medical: Not on file    Non-medical: Not on file  Tobacco Use  . Smoking status: Never Smoker  . Smokeless tobacco: Never Used  Substance and Sexual Activity  . Alcohol use: Never    Frequency: Never  . Drug use: Never  . Sexual activity: Yes    Birth control/protection: None  Lifestyle  . Physical activity    Days per week: Not on file    Minutes per session: Not on file  . Stress: Not on file  Relationships  . Social Musicianconnections    Talks on phone: Not on file    Gets together: Not on file    Attends religious service: Not on file    Active member of club or organization: Not on file    Attends meetings of clubs or organizations: Not on file    Relationship status: Not on file  . Intimate partner violence    Fear of current or ex partner: Not on file    Emotionally abused: Not on file    Physically abused: Not on file    Forced sexual activity: Not on file  Other Topics Concern  . Not on file  Social History Narrative  . Not on file    Outpatient Medications Prior to Visit  Medication Sig Dispense Refill  . acetaminophen (TYLENOL) 500 MG tablet Take 500 mg by mouth every 6 (six) hours as needed for mild pain.     Marland Kitchen. docusate sodium (COLACE) 100 MG capsule Take 1 capsule (100 mg total) by mouth 2 (two) times daily. 10 capsule 0  . folic acid (FOLVITE) 1 MG tablet Take 1 tablet (1 mg total) by mouth daily. 30 tablet 11  . ibuprofen (ADVIL,MOTRIN) 800 MG tablet Take 1 tablet (800 mg total) by mouth every 8 (eight) hours as needed for headache, mild pain, moderate pain or cramping. 30 tablet 3  . ondansetron (ZOFRAN) 4 MG tablet Take 1 tablet (4 mg total) by mouth every 8 (eight) hours as needed for nausea or vomiting. 30 tablet 2  . oxyCODONE ER (XTAMPZA ER) 13.5 MG C12A Take 13.5 mg by mouth every 12 (twelve) hours. 60 capsule 0  . Oxycodone HCl 10 MG TABS Take 2 tablets (total  dose = 20 mg), every 4 hours as needed for pain. 180 tablet 0  . Vitamin D, Ergocalciferol, (DRISDOL) 50000 units CAPS capsule Take 1 capsule (50,000 Units total) by mouth every 7 (seven) days. 5 capsule 2  . voxelotor (OXBRYTA) 500 MG TABS tablet Take 1,500 mg by mouth daily. For Sickle Cell Anemia    . tiZANidine (ZANAFLEX) 4 MG tablet Take 1 tablet every 4 hours as needed. (Patient taking differently: Take 4 mg by mouth every 4 (four) hours as needed for muscle spasms. Take 1 tablet every 4 hours as needed.) 90 tablet 0  . hydroxyurea (HYDREA) 500 MG capsule Take 4 capsules (2,000 mg total) by mouth daily. May take with food to minimize GI side effects. (Patient not taking: Reported on 08/25/2018) 120 capsule 1   No facility-administered medications prior to visit.     No Known Allergies  ROS Review of Systems  Constitutional: Negative.   HENT: Negative.   Eyes: Negative.   Respiratory: Negative.   Cardiovascular: Negative.   Gastrointestinal: Positive for constipation (occasional) and nausea (occasional ).  Endocrine: Negative.   Genitourinary: Negative.   Musculoskeletal: Positive for arthralgias (generalized joint pains).  Skin: Negative.   Allergic/Immunologic: Negative.   Neurological: Positive for dizziness (occasional ) and headaches (occasional ).  Hematological: Negative.   Psychiatric/Behavioral: Positive for agitation. The patient is nervous/anxious.       Objective:    Physical Exam  Constitutional: She is oriented to person, place, and time. She appears well-developed and well-nourished.  HENT:  Head: Normocephalic and atraumatic.  Eyes: Conjunctivae are normal.  Neck: Normal range of motion. Neck supple.  Cardiovascular: Normal rate, regular rhythm, normal heart sounds and intact distal pulses.  Pulmonary/Chest: Effort normal and breath sounds normal.  Abdominal: Soft. Bowel sounds are normal.  Musculoskeletal: Normal range of motion.  Neurological: She is  alert and oriented to person, place, and time. She has normal reflexes.  Skin: Skin is warm and dry.  Psychiatric:  Tearful today.   Nursing note and vitals reviewed.   BP (!) 136/92 (BP Location: Left Arm, Patient Position: Sitting, Cuff Size: Large)   Pulse 96   Temp 98.5 F (36.9 C) (Oral)   Ht 5\' 5"  (1.651 m)   Wt 272 lb (123.4 kg)   LMP 09/06/2018   SpO2 99%   BMI 45.26 kg/m  Wt Readings from Last 3 Encounters:  09/14/18 272 lb (123.4 kg)  09/02/18 277 lb 12.5 oz (126 kg)  08/25/18 273 lb (123.8 kg)  Health Maintenance Due  Topic Date Due  . PAP SMEAR-Modifier  01/13/2004  . INFLUENZA VACCINE  09/04/2018    There are no preventive care reminders to display for this patient.  Lab Results  Component Value Date   TSH 0.862 09/11/2017   Lab Results  Component Value Date   WBC 11.2 (H) 09/06/2018   HGB 10.7 (L) 09/06/2018   HCT 33.0 (L) 09/06/2018   MCV 75.0 (L) 09/06/2018   PLT 178 09/06/2018   Lab Results  Component Value Date   NA 140 09/06/2018   K 4.1 09/06/2018   CO2 30 09/06/2018   GLUCOSE 85 09/06/2018   BUN 12 09/06/2018   CREATININE 0.62 09/06/2018   BILITOT 1.4 (H) 09/06/2018   ALKPHOS 54 09/06/2018   AST 16 09/06/2018   ALT 24 09/06/2018   PROT 6.8 09/06/2018   ALBUMIN 3.7 09/06/2018   CALCIUM 8.8 (L) 09/06/2018   ANIONGAP 7 09/06/2018   Lab Results  Component Value Date   CHOL 92 (L) 09/11/2017   Lab Results  Component Value Date   HDL 45 09/11/2017   Lab Results  Component Value Date   LDLCALC 36 09/11/2017   Lab Results  Component Value Date   TRIG 53 09/11/2017   Lab Results  Component Value Date   CHOLHDL 2.0 09/11/2017   Lab Results  Component Value Date   HGBA1C 4.1 09/11/2017      Assessment & Plan:   1. Hospital discharge follow-up  2. Hb-SS disease with crisis [facility](HCC) [facility], for IVFs, and possible IV pain medications and antiemetics for aide in pain crisis.  She will continue Oxybryta as directed. She will continue to take pain medications as prescribed; will continue to avoid extreme heat and cold; will continue to eat a healthy diet and drink at least 64 ounces of water daily; continue stool softener as needed; will avoid colds and flu; will continue to get plenty of sleep and rest; will continue to avoid high stressful situations and remain infection free; will continue Folic Acid 1 mg daily to avoid sickle cell crisis.  - POCT urinalysis dipstick - Drug Screen 8 w/Conf, Ur - tiZANidine (ZANAFLEX) 4 MG capsule; Take 1 capsule (4 mg total) by mouth 3 (three) times daily.  Dispense: 90 capsule; Refill: 1 - naloxone (NARCAN) 0.4 MG/ML injection; As needed  Dispense: 1 mL; Refill: 0  3. Medication management - tiZANidine (ZANAFLEX) 4 MG capsule; Take 1 capsule (4 mg total) by mouth 3 (three) times daily.  Dispense: 90 capsule; Refill: 1  4. Chronic, continuous use of opioids - naloxone (NARCAN) 0.4 MG/ML injection; As needed  Dispense: 1 mL; Refill: 0  5. Chronic pain syndrome - tiZANidine (ZANAFLEX) 4 MG capsule; Take 1 capsule (4 mg total) by mouth 3 (three) times daily.  Dispense: 90 capsule; Refill: 1  6. Nausea Stable.   7. Follow up She will follow up in 2 months or as needed.  [facility].   Meds ordered this encounter  Medications  . tiZANidine (ZANAFLEX) 4 MG capsule    Sig: Take 1 capsule (4 mg total) by mouth 3 (three) times daily.    Dispense:  90 capsule    Refill:  1  . naloxone (NARCAN) 0.4 MG/ML injection    Sig: As needed    Dispense:  1 mL    Refill:  0    Orders Placed This Encounter  Procedures  . Drug Screen 8 w/Conf, Ur  . POCT urinalysis dipstick   Referral Orders  No referral(s) requested today    Kathe Becton,  MSN, FNP-BC [place], [place] [id number] [phone]  [phone]- fax  Problem List Items Addressed This Visit      Other   Chronic pain syndrome   Relevant Medications   tiZANidine (ZANAFLEX) 4 MG capsule   Chronic, continuous use of opioids   Relevant Medications   naloxone (NARCAN) 0.4 MG/ML injection   Hb-SS disease without crisis ([place])   Relevant Medications   tiZANidine (ZANAFLEX) 4 MG capsule   naloxone (NARCAN) 0.4 MG/ML injection    Other Visit Diagnoses    Hospital discharge follow-up    -  Primary   Medication management       Relevant Medications   tiZANidine (ZANAFLEX) 4 MG capsule   Nausea       Follow up          Meds ordered this encounter  Medications  . tiZANidine (ZANAFLEX) 4 MG capsule    Sig: Take 1 capsule (4 mg total) by mouth 3 (three) times daily.    Dispense:  90 capsule    Refill:  1  . naloxone (NARCAN) 0.4 MG/ML injection    Sig: As needed    Dispense:  1 mL    Refill:  0    Follow-up: Return in about 2 months (around 11/14/2018).    Azzie Glatter, FNP

## 2018-09-14 NOTE — Progress Notes (Signed)
Patient admitted to the day hospital for treatment of sickle cell pain crisis. Patient reported pain rated 8/10 in the legs and arms . Patient placed on Dilaudid PCA, given IV Zofran, IV Toradol  and hydrated with IV fluids. At discharge patient reported  pain at 5/10. Discharge instructions given to patient. Patient alert, oriented and ambulatory at discharge.

## 2018-09-17 ENCOUNTER — Other Ambulatory Visit: Payer: Self-pay | Admitting: Family Medicine

## 2018-09-17 DIAGNOSIS — M62838 Other muscle spasm: Secondary | ICD-10-CM

## 2018-09-17 DIAGNOSIS — G894 Chronic pain syndrome: Secondary | ICD-10-CM

## 2018-09-17 DIAGNOSIS — F1190 Opioid use, unspecified, uncomplicated: Secondary | ICD-10-CM

## 2018-09-17 DIAGNOSIS — D5700 Hb-SS disease with crisis, unspecified: Secondary | ICD-10-CM

## 2018-09-17 DIAGNOSIS — D571 Sickle-cell disease without crisis: Secondary | ICD-10-CM

## 2018-09-17 MED ORDER — IBUPROFEN 800 MG PO TABS: 800.0000 mg | ORAL_TABLET | Freq: Three times a day (TID) | ORAL | 3 refills | Status: DC | PRN

## 2018-09-17 MED ORDER — HYDROXYUREA 500 MG PO CAPS: 2000.0000 mg | ORAL_CAPSULE | Freq: Every day | ORAL | 5 refills | Status: DC

## 2018-09-17 MED ORDER — OXYCODONE HCL 10 MG PO TABS: ORAL_TABLET | ORAL | 0 refills | Status: DC

## 2018-09-17 NOTE — Telephone Encounter (Signed)
Refill on Oxycodone and the Xtampza. Patient wanted to talk to you because she felt as though the message were not getting to you about her refills.  [phone]

## 2018-09-19 LAB — DRUG SCREEN 8 W/CONF, UR
Amphetamines, Urine: NEGATIVE ng/mL
BENZODIAZ UR QL: NEGATIVE ng/mL
Barbiturate screen, urine: NEGATIVE ng/mL
Buprenorphine, Urine: NEGATIVE ng/mL
Cannabinoid Quant, Ur: NEGATIVE ng/mL
Cocaine (Metab.): NEGATIVE ng/mL
Methadone Screen, Urine: NEGATIVE ng/mL

## 2018-09-19 LAB — OPIATES CONFIRMATION, URINE: Opiates: NEGATIVE

## 2018-09-20 ENCOUNTER — Inpatient Hospital Stay (HOSPITAL_COMMUNITY)
Admission: EM | Admit: 2018-09-20 | Discharge: 2018-09-25 | DRG: 812 | Disposition: A | Discharge status: Home / Self Care | Payer: Medicaid Other | Attending: Internal Medicine | Admitting: Internal Medicine

## 2018-09-20 ENCOUNTER — Encounter (HOSPITAL_COMMUNITY): Payer: Self-pay

## 2018-09-20 ENCOUNTER — Other Ambulatory Visit: Payer: Self-pay

## 2018-09-20 DIAGNOSIS — E876 Hypokalemia: Secondary | ICD-10-CM | POA: Diagnosis present

## 2018-09-20 DIAGNOSIS — D5700 Hb-SS disease with crisis, unspecified: Secondary | ICD-10-CM | POA: Diagnosis not present

## 2018-09-20 DIAGNOSIS — G894 Chronic pain syndrome: Secondary | ICD-10-CM | POA: Diagnosis not present

## 2018-09-20 DIAGNOSIS — Z832 Family history of diseases of the blood and blood-forming organs and certain disorders involving the immune mechanism: Secondary | ICD-10-CM

## 2018-09-20 DIAGNOSIS — Z79891 Long term (current) use of opiate analgesic: Secondary | ICD-10-CM | POA: Diagnosis not present

## 2018-09-20 DIAGNOSIS — Z79899 Other long term (current) drug therapy: Secondary | ICD-10-CM | POA: Diagnosis not present

## 2018-09-20 DIAGNOSIS — Z6841 Body Mass Index (BMI) 40.0 and over, adult: Secondary | ICD-10-CM

## 2018-09-20 DIAGNOSIS — Z20828 Contact with and (suspected) exposure to other viral communicable diseases: Secondary | ICD-10-CM | POA: Diagnosis present

## 2018-09-20 DIAGNOSIS — Z03818 Encounter for observation for suspected exposure to other biological agents ruled out: Secondary | ICD-10-CM | POA: Diagnosis not present

## 2018-09-20 LAB — CBC WITH DIFFERENTIAL/PLATELET
Abs Immature Granulocytes: 0.03 10*3/uL (ref 0.00–0.07)
Basophils Absolute: 0 10*3/uL (ref 0.0–0.1)
Basophils Relative: 0 %
Eosinophils Absolute: 0.1 10*3/uL (ref 0.0–0.5)
Eosinophils Relative: 1 %
HCT: 38.7 % (ref 36.0–46.0)
Hemoglobin: 13.3 g/dL (ref 12.0–15.0)
Immature Granulocytes: 0 %
Lymphocytes Relative: 25 %
Lymphs Abs: 2.3 10*3/uL (ref 0.7–4.0)
MCH: 24.8 pg — ABNORMAL LOW (ref 26.0–34.0)
MCHC: 34.4 g/dL (ref 30.0–36.0)
MCV: 72.2 fL — ABNORMAL LOW (ref 80.0–100.0)
Monocytes Absolute: 0.7 10*3/uL (ref 0.1–1.0)
Monocytes Relative: 7 %
Neutro Abs: 6.1 10*3/uL (ref 1.7–7.7)
Neutrophils Relative %: 67 %
Platelets: 262 10*3/uL (ref 150–400)
RBC: 5.36 MIL/uL — ABNORMAL HIGH (ref 3.87–5.11)
RDW: 13.9 % (ref 11.5–15.5)
WBC: 9.1 10*3/uL (ref 4.0–10.5)
nRBC: 0 % (ref 0.0–0.2)

## 2018-09-20 LAB — RETICULOCYTES
Immature Retic Fract: 16.8 % — ABNORMAL HIGH (ref 2.3–15.9)
RBC.: 5.36 MIL/uL — ABNORMAL HIGH (ref 3.87–5.11)
Retic Count, Absolute: 138.8 10*3/uL (ref 19.0–186.0)
Retic Ct Pct: 2.6 % (ref 0.4–3.1)

## 2018-09-20 LAB — COMPREHENSIVE METABOLIC PANEL
ALT: 13 U/L (ref 0–44)
AST: 14 U/L — ABNORMAL LOW (ref 15–41)
Albumin: 4.3 g/dL (ref 3.5–5.0)
Alkaline Phosphatase: 64 U/L (ref 38–126)
Anion gap: 8 (ref 5–15)
BUN: 9 mg/dL (ref 6–20)
CO2: 25 mmol/L (ref 22–32)
Calcium: 9.7 mg/dL (ref 8.9–10.3)
Chloride: 111 mmol/L (ref 98–111)
Creatinine, Ser: 0.75 mg/dL (ref 0.44–1.00)
GFR calc Af Amer: >60 mL/min (ref >60)
GFR calc non Af Amer: >60 mL/min (ref >60)
Glucose, Bld: 110 mg/dL — ABNORMAL HIGH (ref 70–99)
Potassium: 3.4 mmol/L — ABNORMAL LOW (ref 3.5–5.1)
Sodium: 144 mmol/L (ref 135–145)
Total Bilirubin: 0.7 mg/dL (ref 0.3–1.2)
Total Protein: 7.7 g/dL (ref 6.5–8.1)

## 2018-09-20 LAB — I-STAT BETA HCG BLOOD, ED (MC, WL, AP ONLY): I-stat hCG, quantitative: <5 m[IU]/mL (ref <5)

## 2018-09-20 MED ORDER — HYDROMORPHONE HCL 2 MG/ML IJ SOLN: 2.0000 mg | INTRAMUSCULAR | Status: DC

## 2018-09-20 MED ORDER — ENOXAPARIN SODIUM 40 MG/0.4ML ~~LOC~~ SOLN: 40.0000 mg | SUBCUTANEOUS | Status: DC

## 2018-09-20 MED ORDER — DIPHENHYDRAMINE HCL 25 MG PO CAPS: 25.0000 mg | ORAL_CAPSULE | ORAL | Status: DC | PRN

## 2018-09-20 MED ORDER — HYDROMORPHONE HCL 2 MG/ML IJ SOLN
2.0000 mg | INTRAMUSCULAR | Status: AC
  Administered 2018-09-20: 2 mg via INTRAVENOUS
  Filled 2018-09-20: qty 1

## 2018-09-20 MED ORDER — NALOXONE HCL 0.4 MG/ML IJ SOLN: 0.4000 mg | INTRAMUSCULAR | Status: DC | PRN

## 2018-09-20 MED ORDER — HYDROMORPHONE HCL 2 MG/ML IJ SOLN: 2.0000 mg | INTRAMUSCULAR | Status: AC

## 2018-09-20 MED ORDER — HYDROMORPHONE HCL 1 MG/ML IJ SOLN
2.0000 mg | Freq: Once | INTRAMUSCULAR | Status: AC
  Administered 2018-09-20: 2 mg via INTRAVENOUS
  Filled 2018-09-20: qty 2

## 2018-09-20 MED ORDER — ACETAMINOPHEN 325 MG PO TABS: 650.0000 mg | ORAL_TABLET | Freq: Four times a day (QID) | ORAL | Status: DC | PRN

## 2018-09-20 MED ORDER — ACETAMINOPHEN 650 MG RE SUPP: 650.0000 mg | Freq: Four times a day (QID) | RECTAL | Status: DC | PRN

## 2018-09-20 MED ORDER — KETOROLAC TROMETHAMINE 30 MG/ML IJ SOLN
30.0000 mg | INTRAMUSCULAR | Status: AC
  Administered 2018-09-20: 30 mg via INTRAVENOUS
  Filled 2018-09-20: qty 1

## 2018-09-20 MED ORDER — VOXELOTOR 500 MG PO TABS: 1500.0000 mg | ORAL_TABLET | Freq: Every day | ORAL | Status: DC

## 2018-09-20 MED ORDER — SODIUM CHLORIDE 0.45 % IV SOLN
INTRAVENOUS | Status: DC
  Administered 2018-09-20: 20:00:00 via INTRAVENOUS

## 2018-09-20 MED ORDER — HYDROMORPHONE HCL 2 MG/ML IJ SOLN
2.0000 mg | INTRAMUSCULAR | Status: DC
  Administered 2018-09-20: 2 mg via INTRAVENOUS
  Filled 2018-09-20: qty 1

## 2018-09-20 MED ORDER — SODIUM CHLORIDE 0.9% FLUSH: 9.0000 mL | INTRAVENOUS | Status: DC | PRN

## 2018-09-20 MED ORDER — SODIUM CHLORIDE 0.45 % IV BOLUS: 1000.0000 mL | Freq: Once | INTRAVENOUS | Status: DC

## 2018-09-20 MED ORDER — HYDROXYUREA 500 MG PO CAPS
2000.0000 mg | ORAL_CAPSULE | Freq: Every day | ORAL | Status: DC
  Administered 2018-09-21 – 2018-09-25 (×5): 2000 mg via ORAL
  Filled 2018-09-20 (×5): qty 4

## 2018-09-20 MED ORDER — SODIUM CHLORIDE 0.9% FLUSH: 3.0000 mL | Freq: Once | INTRAVENOUS | Status: DC

## 2018-09-20 MED ORDER — POTASSIUM CHLORIDE CRYS ER 20 MEQ PO TBCR
40.0000 meq | EXTENDED_RELEASE_TABLET | Freq: Once | ORAL | Status: AC
  Administered 2018-09-20: 22:00:00 40 meq via ORAL
  Filled 2018-09-20: qty 2

## 2018-09-20 MED ORDER — POTASSIUM CHLORIDE IN NACL 20-0.45 MEQ/L-% IV SOLN
INTRAVENOUS | Status: AC
  Administered 2018-09-20: 22:00:00 via INTRAVENOUS
  Filled 2018-09-20: qty 1000

## 2018-09-20 MED ORDER — SODIUM CHLORIDE 0.9 % IV SOLN: 25.0000 mg | INTRAVENOUS | Status: DC | PRN

## 2018-09-20 MED ORDER — PROCHLORPERAZINE EDISYLATE 10 MG/2ML IJ SOLN
10.0000 mg | INTRAMUSCULAR | Status: DC | PRN
  Administered 2018-09-20 – 2018-09-25 (×9): 10 mg via INTRAVENOUS
  Filled 2018-09-20 (×9): qty 2

## 2018-09-20 MED ORDER — ONDANSETRON HCL 4 MG/2ML IJ SOLN
4.0000 mg | Freq: Once | INTRAMUSCULAR | Status: AC
  Administered 2018-09-20: 4 mg via INTRAVENOUS
  Filled 2018-09-20: qty 2

## 2018-09-20 MED ORDER — ONDANSETRON HCL 4 MG/2ML IJ SOLN: 4.0000 mg | Freq: Four times a day (QID) | INTRAMUSCULAR | Status: DC | PRN

## 2018-09-20 MED ORDER — FOLIC ACID 1 MG PO TABS
1.0000 mg | ORAL_TABLET | Freq: Every day | ORAL | Status: DC
  Administered 2018-09-21 – 2018-09-25 (×5): 1 mg via ORAL
  Filled 2018-09-20 (×5): qty 1

## 2018-09-20 MED ORDER — HYDROMORPHONE 1 MG/ML IV SOLN: INTRAVENOUS | Status: DC

## 2018-09-20 NOTE — ED Notes (Signed)
RN attempted to give report as patient was in transfer to room 1405 4E. Andee Poles, RN requested report from [place], Therapist, sports (primary RN). Primary RN then called report to Andee Poles, RN

## 2018-09-20 NOTE — ED Notes (Signed)
RN attempted to call report to Andee Poles, RN for room 1405 4E. Request made for 10 min more for RN.

## 2018-09-20 NOTE — Progress Notes (Signed)
ED TO INPATIENT HANDOFF REPORT  Name/Age/Gender Stephanie Burch 38 y.o. female  Code Status    Code Status Orders  (From admission, onward)         Start     Ordered   09/20/18 2127  Full code  Continuous     09/20/18 2128        Code Status History    Date Active Date Inactive Code Status Order ID Comments User Context   09/14/2018 1153 09/14/2018 2006 Full Code [id number]  Quentin AngstJegede, Olugbemiga E, MD Inpatient   09/02/2018 1924 09/06/2018 2007 Full Code [id number]  Briscoe Deutscherpyd, Timothy S, MD ED   03/31/2018 0858 03/31/2018 2010 Full Code [id number]  Massie MaroonHollis, Lachina M, FNP Inpatient   03/16/2018 1655 03/18/2018 1620 Full Code [id number]  Massie MaroonHollis, Lachina M, FNP Inpatient   03/16/2018 1018 03/16/2018 1655 Full Code [id number]  Massie MaroonHollis, Lachina M, FNP Inpatient   02/19/2018 0920 02/19/2018 2110 Full Code [id number]  Massie MaroonHollis, Lachina M, FNP Inpatient   02/16/2018 1008 02/16/2018 1903 Full Code [id number]  Massie MaroonHollis, Lachina M, FNP Inpatient   02/09/2018 0934 02/09/2018 1929 Full Code [id number]  Massie MaroonHollis, Lachina M, FNP Inpatient   10/19/2017 0330 10/21/2017 1644 Full Code [id number]  Clydie BraunSmith, Rondell A, MD ED   10/15/2017 0938 10/15/2017 2010 Full Code [id number]  Massie MaroonHollis, Lachina M, FNP Inpatient   Advance Care Planning Activity      Home/SNF/Other Home  Chief Complaint sickle cell pain crisis  Level of Care/Admitting Diagnosis ED Disposition    ED Disposition Condition Comment   Admit  [facility] [[id number]]  Level of Care: Telemetry [5]  Admit to tele based on following criteria: Monitor for Ischemic changes  Covid Evaluation: Asymptomatic Screening Protocol (No Symptoms)  Diagnosis: Sickle cell pain crisis [facility](HCC) [[id number]][[id number]]  Admitting Physician: Bobette MoTIZ, DAVID MANUEL [[id number]]  Attending Physician: Bobette MoORTIZ, DAVID MANUEL [[id number]]  Estimated length of stay: past midnight tomorrow  Certification:: I certify this patient will need inpatient services for at least 2 midnights  PT Class (Do Not  Modify): Inpatient [101]  PT Acc Code (Do Not Modify): Private [1]       Medical History Past Medical History:  Diagnosis Date  . Sickle cell anemia (HCC)     Allergies No Known Allergies  IV Location/Drains/Wounds Patient Lines/Drains/Airways Status   Active Line/Drains/Airways    Name:   Placement date:   Placement time:   Site:   Days:   Peripheral IV 09/20/18 Left Antecubital   09/20/18    2000    Antecubital   less than 1          Labs/Imaging Results for orders placed or performed during the hospital encounter of 09/20/18 (from the past 48 hour(s))  Comprehensive metabolic panel     Status: Abnormal   Collection Time: 09/20/18  4:56 PM  Result Value Ref Range   Sodium 144 135 - 145 mmol/L   Potassium 3.4 (L) 3.5 - 5.1 mmol/L   Chloride 111 98 - 111 mmol/L   CO2 25 22 - 32 mmol/L   Glucose, Bld 110 (H) 70 - 99 mg/dL   BUN 9 6 - 20 mg/dL   Creatinine, Ser 2.700.75 0.44 - 1.00 mg/dL   Calcium 9.7 8.9 - 62.310.3 mg/dL   Total Protein 7.7 6.5 - 8.1 g/dL   Albumin 4.3 3.5 - 5.0 g/dL   AST 14 (L) 15 - 41 U/L   ALT 13 0 - 44 U/L   Alkaline Phosphatase 64 38 -  126 U/L   Total Bilirubin 0.7 0.3 - 1.2 mg/dL   GFR calc non Af Amer >60 >60 mL/min   GFR calc Af Amer >60 >60 mL/min   Anion gap 8 5 - 15    Comment: Performed at [facility], [address] [place], [place] [address]  CBC with Differential     Status: Abnormal   Collection Time: 09/20/18  4:56 PM  Result Value Ref Range   WBC 9.1 4.0 - 10.5 K/uL   RBC 5.36 (H) 3.87 - 5.11 MIL/uL   Hemoglobin 13.3 12.0 - 15.0 g/dL   HCT 19.138.7 47.836.0 - 29.546.0 %   MCV 72.2 (L) 80.0 - 100.0 fL   MCH 24.8 (L) 26.0 - 34.0 pg   MCHC 34.4 30.0 - 36.0 g/dL   RDW 62.113.9 30.811.5 - 65.715.5 %   Platelets 262 150 - 400 K/uL   nRBC 0.0 0.0 - 0.2 %   Neutrophils Relative % 67 %   Neutro Abs 6.1 1.7 - 7.7 K/uL   Lymphocytes Relative 25 %   Lymphs Abs 2.3 0.7 - 4.0 K/uL   Monocytes Relative 7 %   Monocytes Absolute 0.7 0.1 - 1.0  K/uL   Eosinophils Relative 1 %   Eosinophils Absolute 0.1 0.0 - 0.5 K/uL   Basophils Relative 0 %   Basophils Absolute 0.0 0.0 - 0.1 K/uL   Immature Granulocytes 0 %   Abs Immature Granulocytes 0.03 0.00 - 0.07 K/uL    Comment: Performed at [facility], [address] [place], [place] [address]  Reticulocytes     Status: Abnormal   Collection Time: 09/20/18  4:56 PM  Result Value Ref Range   Retic Ct Pct 2.6 0.4 - 3.1 %   RBC. 5.36 (H) 3.87 - 5.11 MIL/uL   Retic Count, Absolute 138.8 19.0 - 186.0 K/uL   Immature Retic Fract 16.8 (H) 2.3 - 15.9 %    Comment: Performed at [facility], [address] [place], [place] [address]  I-Stat beta hCG blood, ED     Status: None   Collection Time: 09/20/18  5:02 PM  Result Value Ref Range   I-stat hCG, quantitative <5.0 <5 mIU/mL   Comment 3            Comment:   GEST. AGE      CONC.  (mIU/mL)   <=1 WEEK        5 - 50     2 WEEKS       50 - 500     3 WEEKS       100 - 10,000     4 WEEKS     1,000 - 30,000        FEMALE AND NON-PREGNANT FEMALE:     LESS THAN 5 mIU/mL    No results found.  Pending Labs Unresulted Labs (From admission, onward)    Start     Ordered   09/20/18 2123  Phosphorus  Add-on,   AD     09/20/18 2122   09/20/18 2122  Magnesium  Add-on,   AD     09/20/18 2122   09/20/18 2120  SARS CORONAVIRUS 2 Nasal Swab Aptima Multi Swab  (Asymptomatic/Tier 2 Patients Labs)  Once,   STAT    Question Answer Comment  Is this test for diagnosis or screening Screening   Symptomatic for COVID-19 as defined by CDC No   Hospitalized for COVID-19 No   Admitted  to ICU for COVID-19 No   Previously tested for COVID-19 Yes   Resident in a congregate (group) care setting No   Employed in healthcare setting No   Pregnant No      09/20/18 2119          Vitals/Pain Today's Vitals   09/20/18 2130 09/20/18 2200 09/20/18 2204 09/20/18 2215  BP:   130/90 111/74  Pulse: 89 91 90 99  Resp: 13  17 15 13   Temp:      TempSrc:      SpO2: 98% 99% 100% 95%  Weight:      PainSc:        Isolation Precautions No active isolations  Medications Medications  sodium chloride flush (NS) 0.9 % injection 3 mL (has no administration in time range)  HYDROmorphone (DILAUDID) injection 2 mg (2 mg Intravenous Given 09/20/18 2200)    Or  HYDROmorphone (DILAUDID) injection 2 mg ( Subcutaneous See Alternative 09/20/18 2200)  0.45 % NaCl with KCl 20 mEq / L infusion ( Intravenous New Bag/Given 09/20/18 2212)  acetaminophen (TYLENOL) tablet 650 mg (has no administration in time range)    Or  acetaminophen (TYLENOL) suppository 650 mg (has no administration in time range)  prochlorperazine (COMPAZINE) injection 10 mg (has no administration in time range)  ketorolac (TORADOL) 30 MG/ML injection 30 mg (30 mg Intravenous Given 09/20/18 2001)  HYDROmorphone (DILAUDID) injection 2 mg (2 mg Intravenous Given 09/20/18 2000)    Or  HYDROmorphone (DILAUDID) injection 2 mg ( Subcutaneous See Alternative 09/20/18 2000)  HYDROmorphone (DILAUDID) injection 2 mg (2 mg Intravenous Given 09/20/18 2032)    Or  HYDROmorphone (DILAUDID) injection 2 mg ( Subcutaneous See Alternative 09/20/18 2032)  HYDROmorphone (DILAUDID) injection 2 mg (2 mg Intravenous Given 09/20/18 2100)    Or  HYDROmorphone (DILAUDID) injection 2 mg ( Subcutaneous See Alternative 09/20/18 2100)  ondansetron (ZOFRAN) injection 4 mg (4 mg Intravenous Given 09/20/18 2016)  potassium chloride SA (K-DUR) CR tablet 40 mEq (40 mEq Oral Given 09/20/18 2201)    Mobility walks

## 2018-09-20 NOTE — ED Provider Notes (Signed)
[facility] DEPT Provider Note   CSN: [id number] Arrival date & time: 09/20/18  1643     History   Chief Complaint Chief Complaint  Patient presents with  . Sickle Cell Pain Crisis    HPI Stephanie Burch is a 38 y.o. female.     38 year old female with history of sickle cell presents pain to her lower extremities consistent with her prior pain crisis.  Patient denies any fever or cough.  No abdominal discomfort.  No headaches.  Pain is uncontrolled with her home oxycodone.  Was seen recently by her doctors about a week ago for similar symptoms and had her medications adjusted.  Denies any urinary symptoms.  No pain to her joints.     Past Medical History:  Diagnosis Date  . Sickle cell anemia [facility])     Patient Active Problem List   Diagnosis Date Noted  . Medication management 09/14/2018  . Muscle spasms of both lower extremities 08/25/2018  . Subjective visual disturbance of right eye 08/25/2018  . Hb-SS disease without crisis ([place]) 04/13/2018  . Chronic pain syndrome 03/16/2018  . Chronic, continuous use of opioids 03/16/2018  . Tachycardia with heart rate 100-120 beats per minute   . Sickle cell anemia with crisis ([place]) 10/19/2017  . Leukocytosis 10/19/2017  . Thrombocytopenia ([place]) 10/19/2017  . Morbidly obese ([place]) 10/19/2017  . Sickle cell pain crisis ([place]) 10/15/2017    History reviewed. No pertinent surgical history.   OB History   No obstetric history on file.      Home Medications    Prior to Admission medications   Medication Sig Start Date End Date Taking? Authorizing Provider  acetaminophen (TYLENOL) 500 MG tablet Take 500 mg by mouth every 6 (six) hours as needed for mild pain.     [provider]  docusate sodium (COLACE) 100 MG capsule Take 1 capsule (100 mg total) by mouth 2 (two) times daily. 09/10/18   Azzie Glatter, FNP  folic acid (FOLVITE) 1 MG tablet Take 1 tablet (1 mg total) by mouth daily.  07/14/18   Azzie Glatter, FNP  hydroxyurea (HYDREA) 500 MG capsule Take 4 capsules (2,000 mg total) by mouth daily. May take with food to minimize GI side effects. 09/17/18   Azzie Glatter, FNP  ibuprofen (ADVIL) 800 MG tablet Take 1 tablet (800 mg total) by mouth every 8 (eight) hours as needed for headache, mild pain, moderate pain or cramping. 09/17/18   Azzie Glatter, FNP  naloxone [facility]) 0.4 MG/ML injection As needed 09/14/18   Azzie Glatter, FNP  ondansetron (ZOFRAN) 4 MG tablet Take 1 tablet (4 mg total) by mouth every 8 (eight) hours as needed for nausea or vomiting. 07/17/18   Azzie Glatter, FNP  oxyCODONE ER [facility]) 13.5 MG C12A Take 13.5 mg by mouth every 12 (twelve) hours. 09/06/18 10/06/18  Azzie Glatter, FNP  Oxycodone HCl 10 MG TABS Take 2 tablets (total dose = 20 mg), every 4 hours as needed for pain. 09/19/18   Azzie Glatter, FNP  tiZANidine (ZANAFLEX) 4 MG capsule Take 1 capsule (4 mg total) by mouth 3 (three) times daily. 09/14/18   Azzie Glatter, FNP  Vitamin D, Ergocalciferol, (DRISDOL) 50000 units CAPS capsule Take 1 capsule (50,000 Units total) by mouth every 7 (seven) days. 12/02/17   Azzie Glatter, FNP  voxelotor (OXBRYTA) 500 MG TABS tablet Take 1,500 mg by mouth daily. For Sickle Cell Anemia  [provider]    Family History Family History  Problem Relation Age of Onset  . Hypertension Mother   . Sickle cell trait Mother   . Diabetes Father   . Sickle cell trait Father     Social History Social History   Tobacco Use  . Smoking status: Never Smoker  . Smokeless tobacco: Never Used  Substance Use Topics  . Alcohol use: Never    Frequency: Never  . Drug use: Never     Allergies   Patient has no known allergies.   Review of Systems Review of Systems  All other systems reviewed and are negative.    Physical Exam Updated Vital Signs BP (!) 162/105   Pulse (!) 139   Temp 99.9 F (37.7 C) (Oral)   Resp 20    Wt 123 kg   LMP 09/06/2018   SpO2 99%   BMI 45.12 kg/m   Physical Exam Vitals signs and nursing note reviewed.  Constitutional:      General: She is not in acute distress.    Appearance: Normal appearance. She is well-developed. She is not toxic-appearing.  HENT:     Head: Normocephalic and atraumatic.  Eyes:     General: Lids are normal.     Conjunctiva/sclera: Conjunctivae normal.     Pupils: Pupils are equal, round, and reactive to light.  Neck:     Musculoskeletal: Normal range of motion and neck supple.     Thyroid: No thyroid mass.     Trachea: No tracheal deviation.  Cardiovascular:     Rate and Rhythm: Normal rate and regular rhythm.     Heart sounds: Normal heart sounds. No murmur. No gallop.   Pulmonary:     Effort: Pulmonary effort is normal. No respiratory distress.     Breath sounds: Normal breath sounds. No stridor. No decreased breath sounds, wheezing, rhonchi or rales.  Abdominal:     General: Bowel sounds are normal. There is no distension.     Palpations: Abdomen is soft.     Tenderness: There is no abdominal tenderness. There is no rebound.  Musculoskeletal: Normal range of motion.        General: No tenderness.  Skin:    General: Skin is warm and dry.     Findings: No abrasion or rash.  Neurological:     Mental Status: She is alert and oriented to person, place, and time.     GCS: GCS eye subscore is 4. GCS verbal subscore is 5. GCS motor subscore is 6.     Cranial Nerves: No cranial nerve deficit.     Sensory: No sensory deficit.  Psychiatric:        Speech: Speech normal.        Behavior: Behavior normal.      ED Treatments / Results  Labs (all labs ordered are listed, but only abnormal results are displayed) Labs Reviewed  COMPREHENSIVE METABOLIC PANEL - Abnormal; Notable for the following components:      Result Value   Potassium 3.4 (*)    Glucose, Bld 110 (*)    AST 14 (*)    All other components within normal limits  CBC WITH  DIFFERENTIAL/PLATELET - Abnormal; Notable for the following components:   RBC 5.36 (*)    MCV 72.2 (*)    MCH 24.8 (*)    All other components within normal limits  RETICULOCYTES - Abnormal; Notable for the following components:   RBC. 5.36 (*)  Immature Retic Fract 16.8 (*)    All other components within normal limits  I-STAT BETA HCG BLOOD, ED ([facility], [facility], [facility] ONLY)    EKG None  Radiology No results found.  Procedures Procedures (including critical care time)  Medications Ordered in ED Medications  sodium chloride flush (NS) 0.9 % injection 3 mL (has no administration in time range)  ketorolac (TORADOL) 30 MG/ML injection 30 mg (has no administration in time range)  HYDROmorphone (DILAUDID) injection 2 mg (has no administration in time range)    Or  HYDROmorphone (DILAUDID) injection 2 mg (has no administration in time range)  HYDROmorphone (DILAUDID) injection 2 mg (has no administration in time range)    Or  HYDROmorphone (DILAUDID) injection 2 mg (has no administration in time range)  HYDROmorphone (DILAUDID) injection 2 mg (has no administration in time range)    Or  HYDROmorphone (DILAUDID) injection 2 mg (has no administration in time range)  HYDROmorphone (DILAUDID) injection 2 mg (has no administration in time range)    Or  HYDROmorphone (DILAUDID) injection 2 mg (has no administration in time range)  0.45 % sodium chloride infusion (has no administration in time range)     Initial Impression / Assessment and Plan / ED Course  I have reviewed the triage vital signs and the nursing notes.  Pertinent labs & imaging results that were available during my care of the patient were reviewed by me and considered in my medical decision making (see chart for details).        Patient treated with IV pain meds here and continues to note discomfort.  Also given fluids.  Will admit to the hospitalist  Final Clinical Impressions(s) / ED Diagnoses   Final diagnoses:   None    ED Discharge Orders    None       Lorre NickAllen, Elisheva Fallas, MD 09/20/18 2107

## 2018-09-20 NOTE — ED Triage Notes (Signed)
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 Pt states no relief with her home meds.

## 2018-09-20 NOTE — ED Notes (Signed)
Report called to receiving RN.

## 2018-09-20 NOTE — H&P (Signed)
History and Physical    Stephanie Burch:[id number]:[id number] DOB: 10/06/1980 DOA: 09/20/2018  PCP: Kallie LocksStroud, [name], FNP   Patient coming from: Home.  I have personally briefly reviewed patient's old medical records in [facility]  Chief Complaint: Sickle cell pain crisis.  HPI: Stephanie StarchKimaada Lamaster is a 38 y.o. female with medical history significant of sickle cell disease, morbid obesity who is coming to the emergency department due to progressively worse lower extremities pain consistent with her sickle cell pain crisis symptoms that has not gotten much relief with her oral meds at home.  She denies fever, chills, rhinorrhea, sore throat, dyspnea, wheezing or hemoptysis.  No chest pain, palpitations, dizziness, diaphoresis, PND, orthopnea or pitting edema of the lower extremities.  No abdominal pain, vomiting, diarrhea, melena or hematochezia.  She was nauseous earlier due to hydromorphone.  She occasionally gets constipation due to medications.  No dysuria, frequency or hematuria.  Occasional pruritus.  No polyuria, polydipsia, polyphagia or blurred vision.  ED Course: Initial vital signs temperature 99.9 F, pulse 139, respirations 20, blood pressure 162/105 mmHg and O2 sat 99% on room air.  The patient received IV fluids and 12 mg of hydromorphone in the emergency department.  [place] count is 9.1, hemoglobin 13.3 g/dL and platelets [id number].  Reticulocyte count is 2.6% and total reticulocyte count 138.8.  Immature reticulocyte cholecystitis 16.8%.  CMP shows a potassium of 3.4 mmol/L, glucose of 110 mg/dL and AST low at 14 units/L, the rest of the values are within normal limits.  Review of Systems: As per HPI otherwise 10 point review of systems negative.   Past Medical History:  Diagnosis Date  . Sickle cell anemia (HCC)     History reviewed. No pertinent surgical history.   reports that she has never smoked. She has never used smokeless tobacco. She reports that she does not drink alcohol or  use drugs.  No Known Allergies  Family History  Problem Relation Age of Onset  . Hypertension Mother   . Sickle cell trait Mother   . Diabetes Father   . Sickle cell trait Father    Prior to Admission medications   Medication Sig Start Date End Date Taking? Authorizing Provider  folic acid (FOLVITE) 1 MG tablet Take 1 tablet (1 mg total) by mouth daily. 07/14/18  Yes Kallie LocksStroud, [name], FNP  ibuprofen (ADVIL) 800 MG tablet Take 1 tablet (800 mg total) by mouth every 8 (eight) hours as needed for headache, mild pain, moderate pain or cramping. 09/17/18  Yes Kallie LocksStroud, [name], FNP  oxyCODONE ER [facility](XTAMPZA ER) 13.5 MG C12A Take 13.5 mg by mouth every 12 (twelve) hours. 09/06/18 10/06/18 Yes Kallie LocksStroud, [name], FNP  Oxycodone HCl 10 MG TABS Take 2 tablets (total dose = 20 mg), every 4 hours as needed for pain. 09/19/18  Yes Kallie LocksStroud, [name], FNP  tiZANidine (ZANAFLEX) 4 MG capsule Take 1 capsule (4 mg total) by mouth 3 (three) times daily. 09/14/18  Yes Kallie LocksStroud, [name], FNP  voxelotor (OXBRYTA) 500 MG TABS tablet Take 1,500 mg by mouth daily. For Sickle Cell Anemia   Yes [provider]  docusate sodium (COLACE) 100 MG capsule Take 1 capsule (100 mg total) by mouth 2 (two) times daily. Patient not taking: Reported on 09/20/2018 09/10/18   Kallie LocksStroud, [name], FNP  hydroxyurea (HYDREA) 500 MG capsule Take 4 capsules (2,000 mg total) by mouth daily. May take with food to minimize GI side effects. 09/17/18   Kallie LocksStroud, [name], FNP  naloxone (NARCAN) 0.4 MG/ML injection As needed 09/14/18   Azzie Glatter, FNP  ondansetron (ZOFRAN) 4 MG tablet Take 1 tablet (4 mg total) by mouth every 8 (eight) hours as needed for nausea or vomiting. Patient not taking: Reported on 09/20/2018 07/17/18   Azzie Glatter, FNP  Vitamin D, Ergocalciferol, (DRISDOL) 50000 units CAPS capsule Take 1 capsule (50,000 Units total) by mouth every 7 (seven) days. 12/02/17   Azzie Glatter, FNP    Physical Exam: Vitals:    09/20/18 1648 09/20/18 1649 09/20/18 1650 09/20/18 1930  BP:  (!) 162/105  107/74  Pulse: (!) 139   (!) 102  Resp: 20   (!) 23  Temp: 99.9 F (37.7 C)     TempSrc: Oral     SpO2: 99%   98%  Weight:   123 kg     Constitutional: NAD, calm, comfortable Eyes: PERRL, lids and conjunctivae normal ENMT: Mucous membranes are moist. Posterior pharynx clear of any exudate or lesions. Neck: normal, supple, no masses, no thyromegaly Respiratory: clear to auscultation bilaterally, no wheezing, no crackles. Normal respiratory effort. No accessory muscle use.  Cardiovascular: Regular rate and rhythm, no murmurs / rubs / gallops. No extremity edema. 2+ pedal pulses. No carotid bruits.  Abdomen: Obese, soft, no tenderness, no masses palpated. No hepatosplenomegaly. Bowel sounds positive.  Musculoskeletal: no clubbing / cyanosis.Good ROM, no contractures. Normal muscle tone.  Skin: no rashes, lesions, ulcers on limited dermatological examination. Neurologic: CN 2-12 grossly intact. Sensation intact, DTR normal. Strength 5/5 in all 4.  Psychiatric: Normal judgment and insight. Alert and oriented x 3. Normal mood.   Labs on Admission: I have personally reviewed following labs and imaging studies  CBC: Recent Labs  Lab 09/14/18 1114 09/20/18 1656  WBC 6.0 9.1  NEUTROABS  --  6.1  HGB 11.7* 13.3  HCT 34.2* 38.7  MCV 73.2* 72.2*  PLT 157 092   Basic Metabolic Panel: Recent Labs  Lab 09/14/18 1114 09/20/18 1656  NA 140 144  K 3.7 3.4*  CL 108 111  CO2 24 25  GLUCOSE 100* 110*  BUN 8 9  CREATININE 0.60 0.75  CALCIUM 9.3 9.7   GFR: Estimated Creatinine Clearance: 129.2 mL/min (by C-G formula based on SCr of 0.75 mg/dL). Liver Function Tests: Recent Labs  Lab 09/14/18 1114 09/20/18 1656  AST 17 14*  ALT 20 13  ALKPHOS 62 64  BILITOT 1.1 0.7  PROT 7.8 7.7  ALBUMIN 4.3 4.3   No results for input(s): LIPASE, AMYLASE in the last 168 hours. No results for input(s): AMMONIA in the  last 168 hours. Coagulation Profile: No results for input(s): INR, PROTIME in the last 168 hours. Cardiac Enzymes: No results for input(s): CKTOTAL, CKMB, CKMBINDEX, TROPONINI in the last 168 hours. BNP (last 3 results) No results for input(s): PROBNP in the last 8760 hours. HbA1C: No results for input(s): HGBA1C in the last 72 hours. CBG: No results for input(s): GLUCAP in the last 168 hours. Lipid Profile: No results for input(s): CHOL, HDL, LDLCALC, TRIG, CHOLHDL, LDLDIRECT in the last 72 hours. Thyroid Function Tests: No results for input(s): TSH, T4TOTAL, FREET4, T3FREE, THYROIDAB in the last 72 hours. Anemia Panel: Recent Labs    09/20/18 1656  RETICCTPCT 2.6   Urine analysis:    Component Value Date/Time   COLORURINE YELLOW 02/09/2018 0933   APPEARANCEUR CLEAR 02/09/2018 0933   LABSPEC 1.011 02/09/2018 0933   PHURINE 5.0 02/09/2018 0933   GLUCOSEU NEGATIVE 02/09/2018 0933  HGBUR NEGATIVE 02/09/2018 0933   BILIRUBINUR negative 09/14/2018 0830   KETONESUR negative 09/14/2018 0830   KETONESUR NEGATIVE 02/09/2018 0933   PROTEINUR negative 09/14/2018 0830   PROTEINUR NEGATIVE 02/09/2018 0933   UROBILINOGEN 1.0 09/14/2018 0830   NITRITE Negative 09/14/2018 0830   NITRITE NEGATIVE 02/09/2018 0933   LEUKOCYTESUR Negative 09/14/2018 0830    Radiological Exams on Admission: No results found.  EKG: Independently reviewed. Vent. rate 86 BPM PR interval * ms QRS duration 82 ms QT/QTc 380/455 ms P-R-T axes 74 87 14 Sinus rhythm Borderline T wave abnormalities  Assessment/Plan Principal Problem:   Sickle cell pain crisis (HCC) Admit to telemetry/inpatient. Supplemental oxygen. Continue IV fluids. Continue hydromorphone PCA. Antiemetics as needed. Antihistamines as needed. Sickle cell team will be following in the morning.  Active Problems:   Hypokalemia Replacing. Follow-up potassium level.    Morbidly obese (HCC) Needs lifestyle modifications.  DVT  prophylaxis: Lovenox given. Code Status: Full code. Family Communication: Disposition Plan: Admit for sickle cell pain crisis treatment. Consults called: Admission status: Inpatient/telemetry.   Bobette Moavid Manuel  MD [facility]  If 7PM-7AM, please contact night-coverage [email or web address]  09/20/2018, 9:30 PM   This document was prepared using Dragon voice recognition software and may contain some unintended transcription errors.

## 2018-09-21 ENCOUNTER — Ambulatory Visit: Payer: Medicaid Other | Admitting: Family Medicine

## 2018-09-21 ENCOUNTER — Telehealth: Payer: Self-pay

## 2018-09-21 DIAGNOSIS — E876 Hypokalemia: Secondary | ICD-10-CM

## 2018-09-21 LAB — MAGNESIUM: Magnesium: 2.1 mg/dL (ref 1.7–2.4)

## 2018-09-21 LAB — PHOSPHORUS: Phosphorus: 5.8 mg/dL — ABNORMAL HIGH (ref 2.5–4.6)

## 2018-09-21 LAB — SARS CORONAVIRUS 2 (TAT 6-24 HRS): SARS Coronavirus 2: NEGATIVE

## 2018-09-21 MED ORDER — POTASSIUM CHLORIDE IN NACL 20-0.45 MEQ/L-% IV SOLN
INTRAVENOUS | Status: DC
  Administered 2018-09-21 – 2018-09-22 (×4): via INTRAVENOUS
  Filled 2018-09-21 (×5): qty 1000

## 2018-09-21 MED ORDER — ONDANSETRON HCL 4 MG/2ML IJ SOLN
4.0000 mg | Freq: Four times a day (QID) | INTRAMUSCULAR | Status: DC | PRN
  Administered 2018-09-21: 16:00:00 4 mg via INTRAVENOUS
  Filled 2018-09-21 (×3): qty 2

## 2018-09-21 MED ORDER — SODIUM CHLORIDE 0.9% FLUSH: 9.0000 mL | INTRAVENOUS | Status: DC | PRN

## 2018-09-21 MED ORDER — NALOXONE HCL 0.4 MG/ML IJ SOLN: 0.4000 mg | INTRAMUSCULAR | Status: DC | PRN

## 2018-09-21 MED ORDER — SODIUM CHLORIDE 0.9 % IV SOLN
25.0000 mg | INTRAVENOUS | Status: DC | PRN
  Filled 2018-09-21: qty 0.5

## 2018-09-21 MED ORDER — HYDROMORPHONE HCL 1 MG/ML IJ SOLN: 2.0000 mg | Freq: Once | INTRAMUSCULAR | Status: DC | PRN

## 2018-09-21 MED ORDER — OXYCODONE HCL ER 10 MG PO T12A
10.0000 mg | EXTENDED_RELEASE_TABLET | Freq: Two times a day (BID) | ORAL | Status: DC
  Administered 2018-09-21 – 2018-09-25 (×8): 10 mg via ORAL
  Filled 2018-09-21 (×9): qty 1

## 2018-09-21 MED ORDER — VOXELOTOR 500 MG PO TABS
1500.0000 mg | ORAL_TABLET | Freq: Every day | ORAL | Status: DC
  Administered 2018-09-21 – 2018-09-24 (×4): 1500 mg via ORAL

## 2018-09-21 MED ORDER — DIPHENHYDRAMINE HCL 25 MG PO CAPS
25.0000 mg | ORAL_CAPSULE | ORAL | Status: DC | PRN
  Administered 2018-09-21: 25 mg via ORAL
  Filled 2018-09-21: qty 1

## 2018-09-21 MED ORDER — HYDROMORPHONE 1 MG/ML IV SOLN
INTRAVENOUS | Status: DC
  Administered 2018-09-21: 30 mg via INTRAVENOUS
  Administered 2018-09-21: 0.6 mg via INTRAVENOUS
  Filled 2018-09-21: qty 30

## 2018-09-21 MED ORDER — HYDROMORPHONE 1 MG/ML IV SOLN
INTRAVENOUS | Status: DC
  Administered 2018-09-21: 30 mg via INTRAVENOUS
  Administered 2018-09-21: 8.4 mg via INTRAVENOUS
  Administered 2018-09-21: 7.5 mg via INTRAVENOUS
  Administered 2018-09-22: 3 mg via INTRAVENOUS
  Administered 2018-09-22: 1.4 mg via INTRAVENOUS
  Administered 2018-09-22: 10.2 mg via INTRAVENOUS
  Administered 2018-09-22: 9.6 mg via INTRAVENOUS
  Filled 2018-09-21: qty 30

## 2018-09-21 NOTE — BH Specialist Note (Signed)
Reason for Referral: Stephanie Burch is a 38 y.o. female  Pt was referred by NP, [place] Hollis for: stress precipitating sickle cell crisis   Pt reports the following concerns: financial/work stress, recent loss/grief, stress precipitating sickle cell crisis, some low mood     Plan: 1. Addressed today: Met with patient at bedside in [place]. Assessed concerns and needs. Patient indicated she would like to see a counselor regularly due to stress and feeling down. Patient would like to learn healthy ways to manage stress and reduce sickle cell crisis/hospitalizations. Prior to the last few weeks, patient did not frequently admit for sickle cell pain.  Patient tearful at times during visit. Does report mood swings and feeling sad and down at times. Denies suicidal or homicidal ideation. She is having stress related to work and finances and grief over the recent loss of her brother.   Reflected on patient's strenght and resiliency and provided encouragement. Offered referral to outside counseling or to have counseling with CSW at the [facility]. Patient interested in starting counseling with CSW. Will reassess as needed if patient has more complex behavioral health needs that cannot be managed at the [facility]. Patient goal is to reduce stress with healthy coping strategies.   2. Referral: [facility] for supportive counseling with this CSW  3. Follow up: Initial IBH appointment 09/29/18 at 9 am at the [place] [phone]

## 2018-09-21 NOTE — Progress Notes (Signed)
Subjective: Stephanie Burch, a 38 year old female with a medical history significant for sickle cell disease, chronic pain syndrome, opiate dependence, and morbid obesity was admitted for sickle cell pain crisis. Patient attributes current pain crisis to increased stressors at home.  She states that pain intensity is 8/10 primarily to lower extremities.  Patient is using max dose on PCA.  Patient denies headache, chest pain, shortness of breath, heart palpitations, dysuria, nausea, vomiting, or diarrhea.  Objective:  Vital signs in last 24 hours:  Vitals:   09/21/18 0300 09/21/18 0449 09/21/18 0451 09/21/18 0819  BP: 98/67 (!) 110/46    Pulse: 82 85    Resp: 16 14 14 15   Temp:  98 F (36.7 C)    TempSrc:  Oral    SpO2: 97% 100% 100% 100%  Weight:      Height:        Intake/Output from previous day:   Intake/Output Summary (Last 24 hours) at 09/21/2018 0846 Last data filed at 09/21/2018 0600 Gross per 24 hour  Intake 1390.99 ml  Output -  Net 1390.99 ml    Physical Exam: General: Alert, awake, oriented x3, in no acute distress.  HEENT: [place]/AT PEERL, EOMI Neck: Trachea midline,  no masses, no thyromegal,y no JVD, no carotid bruit OROPHARYNX:  Moist, No exudate/ erythema/lesions.  Heart: Regular rate and rhythm, without murmurs, rubs, gallops, PMI non-displaced, no heaves or thrills on palpation.  Lungs: Clear to auscultation, no wheezing or rhonchi noted. No increased vocal fremitus resonant to percussion  Abdomen: Soft, nontender, nondistended, abdominal obesity positive bowel sounds, no masses no hepatosplenomegaly noted..  Neuro: No focal neurological deficits noted cranial nerves II through XII grossly intact. DTRs 2+ bilaterally upper and lower extremities. Strength 5 out of 5 in bilateral upper and lower extremities. Musculoskeletal: No warm swelling or erythema around joints, no spinal tenderness noted. Psychiatric: Patient alert and oriented x3, good insight and  cognition, good recent to remote recall. Lymph node survey: No cervical axillary or inguinal lymphadenopathy noted.  Lab Results:  Basic Metabolic Panel:    Component Value Date/Time   NA 144 09/20/2018 1656   NA 142 09/11/2017 1431   K 3.4 (L) 09/20/2018 1656   CL 111 09/20/2018 1656   CO2 25 09/20/2018 1656   BUN 9 09/20/2018 1656   BUN 8 09/11/2017 1431   CREATININE 0.75 09/20/2018 1656   GLUCOSE 110 (H) 09/20/2018 1656   CALCIUM 9.7 09/20/2018 1656   CBC:    Component Value Date/Time   WBC 9.1 09/20/2018 1656   HGB 13.3 09/20/2018 1656   HGB 13.0 09/11/2017 1431   HCT 38.7 09/20/2018 1656   HCT 38.4 09/11/2017 1431   PLT 262 09/20/2018 1656   PLT 258 09/11/2017 1431   MCV 72.2 (L) 09/20/2018 1656   MCV 75 (L) 09/11/2017 1431   NEUTROABS 6.1 09/20/2018 1656   NEUTROABS 3.9 09/11/2017 1431   LYMPHSABS 2.3 09/20/2018 1656   LYMPHSABS 2.2 09/11/2017 1431   MONOABS 0.7 09/20/2018 1656   EOSABS 0.1 09/20/2018 1656   EOSABS 0.1 09/11/2017 1431   BASOSABS 0.0 09/20/2018 1656   BASOSABS 0.0 09/11/2017 1431    Recent Results (from the past 240 hour(s))  SARS CORONAVIRUS 2 Nasal Swab Aptima Multi Swab     Status: None   Collection Time: 09/20/18 10:19 PM   Specimen: Aptima Multi Swab; Nasal Swab  Result Value Ref Range Status   SARS Coronavirus 2 NEGATIVE NEGATIVE Final    Comment: (NOTE)  SARS-CoV-2 target nucleic acids are NOT DETECTED. The SARS-CoV-2 RNA is generally detectable in upper and lower respiratory specimens during the acute phase of infection. Negative results do not preclude SARS-CoV-2 infection, do not rule out co-infections with other pathogens, and should not be used as the sole basis for treatment or other patient management decisions. Negative results must be combined with clinical observations, patient history, and epidemiological information. The expected result is Negative. Fact Sheet for  Patients: HairSlick.nohttps://www.fda.gov/media/138098/download Fact Sheet for Healthcare Providers: quierodirigir.comhttps://www.fda.gov/media/138095/download This test is not yet approved or cleared by the [place] FDA and  has been authorized for detection and/or diagnosis of SARS-CoV-2 by FDA under an Emergency Use Authorization (EUA). This EUA will remain  in effect (meaning this test can be used) for the duration of the COVID-19 declaration under Section 56 4(b)(1) of the Act, 21 U.S.C. section 360bbb-3(b)(1), unless the authorization is terminated or revoked sooner. Performed at [facility], [address] [place], [place] [address]     Studies/Results: No results found.  Medications: Scheduled Meds: . folic acid  1 mg Oral Daily  . HYDROmorphone   Intravenous Q4H  . hydroxyurea  2,000 mg Oral Daily  . sodium chloride flush  3 mL Intravenous Once  . voxelotor  1,500 mg Oral Daily   Continuous Infusions: . 0.45 % NaCl with KCl 20 mEq / L 125 mL/hr at 09/21/18 0600  . diphenhydrAMINE     PRN Meds:.acetaminophen **OR** acetaminophen, diphenhydrAMINE **OR** diphenhydrAMINE, HYDROmorphone (DILAUDID) injection, naloxone **AND** sodium chloride flush, ondansetron (ZOFRAN) IV, prochlorperazine  Consultants:  None  Procedures:  None  Antibiotics:  None  Assessment/Plan: Principal Problem:   Sickle cell pain crisis (HCC) Active Problems:   Morbidly obese (HCC)   Hypokalemia  Sickle cell disease with pain crisis: Increase settings on IV Dilaudid PCA to 0.6 mg, 10-minute lockout, and 4 mg/h. Toradol 15 mg IV every 6 hours Continue IV fluids Reevaluate pain scale regularly Maintain oxygen saturation above 90%, 2 L supplemental oxygen as needed  Chronic pain syndrome: OxyContin 10 mg every 12 hours Hold oxycodone, use PCA Dilaudid as substitute  Sickle cell anemia: Continue folic acid and hydroxyurea. Hemoglobin stable.  No clinical indication for blood transfusion.   Continue to monitor closely.  Morbid obesity: Heart healthy diet.  Hypokalemia: Replete potassium.  Repeat BMP in a.m. Code Status: Full Code Family Communication: N/A Disposition Plan: Not yet ready for discharge  Tylon Kemmerling Rennis PettyMoore Destine Zirkle  APRN, MSN, FNP-C [facility] [address]  [place], [place] [address] [phone]  If 5PM-7AM, please contact night-coverage.  09/21/2018, 8:46 AM  LOS: 1 day

## 2018-09-22 MED ORDER — HYDROMORPHONE 1 MG/ML IV SOLN
INTRAVENOUS | Status: DC
  Administered 2018-09-22: 1 mg via INTRAVENOUS
  Administered 2018-09-22: 30 mg via INTRAVENOUS
  Administered 2018-09-22: 3.5 mg via INTRAVENOUS
  Administered 2018-09-23: 5.5 mg via INTRAVENOUS
  Administered 2018-09-23: 3.5 mg via INTRAVENOUS
  Administered 2018-09-23: 0 mg via INTRAVENOUS
  Filled 2018-09-22: qty 30

## 2018-09-22 MED ORDER — OXYCODONE HCL 5 MG PO TABS
10.0000 mg | ORAL_TABLET | ORAL | Status: DC | PRN
  Administered 2018-09-22 – 2018-09-23 (×3): 10 mg via ORAL
  Filled 2018-09-22 (×3): qty 2

## 2018-09-22 NOTE — Progress Notes (Signed)
Subjective: Stephanie Burch, a 38 year old female with a medical history significant for sickle cell disease, chronic pain syndrome, opiate dependence, and morbid obesity was admitted for sickle cell pain crisis.  Patient continues to complain of increased pain localized to right knee.  Patient states it is been difficult to ambulate, pain is worsened by weightbearing.  Pain intensity 9/10 despite IV Dilaudid PCA.  Patient is maximizing PCA. Patient denies headache, chest pain, shortness of breath, nausea, vomiting, or diarrhea.  Objective:  Vital signs in last 24 hours:  Vitals:   09/22/18 0451 09/22/18 0451 09/22/18 0800 09/22/18 1025  BP:  110/66    Pulse:  86    Resp: 12 20 14 14   Temp:  98.6 F (37 C)    TempSrc:  Oral    SpO2: 99% 98% 98% 98%  Weight:      Height:        Intake/Output from previous day:   Intake/Output Summary (Last 24 hours) at 09/22/2018 1240 Last data filed at 09/22/2018 0900 Gross per 24 hour  Intake 2771.17 ml  Output -  Net 2771.17 ml    Physical Exam: General: Alert, awake, oriented x3, in no acute distress.  HEENT: [place]/AT PEERL, EOMI Neck: Trachea midline,  no masses, no thyromegal,y no JVD, no carotid bruit OROPHARYNX:  Moist, No exudate/ erythema/lesions.  Heart: Regular rate and rhythm, without murmurs, rubs, gallops, PMI non-displaced, no heaves or thrills on palpation.  Lungs: Clear to auscultation, no wheezing or rhonchi noted. No increased vocal fremitus resonant to percussion  Abdomen: Soft, nontender, nondistended, positive bowel sounds, no masses no hepatosplenomegaly noted.. Abdominal obesity. Neuro: No focal neurological deficits noted cranial nerves II through XII grossly intact. DTRs 2+ bilaterally upper and lower extremities. Strength 5 out of 5 in bilateral upper and lower extremities. Musculoskeletal: No warm swelling or erythema around joints, no spinal tenderness noted. Psychiatric: Patient alert and oriented x3, good insight and  cognition, good recent to remote recall. Lymph node survey: No cervical axillary or inguinal lymphadenopathy noted.  Lab Results:  Basic Metabolic Panel:    Component Value Date/Time   NA 144 09/20/2018 1656   NA 142 09/11/2017 1431   K 3.4 (L) 09/20/2018 1656   CL 111 09/20/2018 1656   CO2 25 09/20/2018 1656   BUN 9 09/20/2018 1656   BUN 8 09/11/2017 1431   CREATININE 0.75 09/20/2018 1656   GLUCOSE 110 (H) 09/20/2018 1656   CALCIUM 9.7 09/20/2018 1656   CBC:    Component Value Date/Time   WBC 9.1 09/20/2018 1656   HGB 13.3 09/20/2018 1656   HGB 13.0 09/11/2017 1431   HCT 38.7 09/20/2018 1656   HCT 38.4 09/11/2017 1431   PLT 262 09/20/2018 1656   PLT 258 09/11/2017 1431   MCV 72.2 (L) 09/20/2018 1656   MCV 75 (L) 09/11/2017 1431   NEUTROABS 6.1 09/20/2018 1656   NEUTROABS 3.9 09/11/2017 1431   LYMPHSABS 2.3 09/20/2018 1656   LYMPHSABS 2.2 09/11/2017 1431   MONOABS 0.7 09/20/2018 1656   EOSABS 0.1 09/20/2018 1656   EOSABS 0.1 09/11/2017 1431   BASOSABS 0.0 09/20/2018 1656   BASOSABS 0.0 09/11/2017 1431    Recent Results (from the past 240 hour(s))  SARS CORONAVIRUS 2 Nasal Swab Aptima Multi Swab     Status: None   Collection Time: 09/20/18 10:19 PM   Specimen: Aptima Multi Swab; Nasal Swab  Result Value Ref Range Status   SARS Coronavirus 2 NEGATIVE NEGATIVE Final    Comment: (  NOTE) SARS-CoV-2 target nucleic acids are NOT DETECTED. The SARS-CoV-2 RNA is generally detectable in upper and lower respiratory specimens during the acute phase of infection. Negative results do not preclude SARS-CoV-2 infection, do not rule out co-infections with other pathogens, and should not be used as the sole basis for treatment or other patient management decisions. Negative results must be combined with clinical observations, patient history, and epidemiological information. The expected result is Negative. Fact Sheet for  Patients: SugarRoll.be Fact Sheet for Healthcare Providers: https://www.woods-mathews.com/ This test is not yet approved or cleared by the [place] FDA and  has been authorized for detection and/or diagnosis of SARS-CoV-2 by FDA under an Emergency Use Authorization (EUA). This EUA will remain  in effect (meaning this test can be used) for the duration of the COVID-19 declaration under Section 56 4(b)(1) of the Act, 21 U.S.C. section 360bbb-3(b)(1), unless the authorization is terminated or revoked sooner. Performed at [facility], [place] [address] [place], [place] [address]     Studies/Results: No results found.  Medications: Scheduled Meds: . folic acid  1 mg Oral Daily  . HYDROmorphone   Intravenous Q4H  . hydroxyurea  2,000 mg Oral Daily  . oxyCODONE  10 mg Oral Q12H  . sodium chloride flush  3 mL Intravenous Once  . voxelotor  1,500 mg Oral QHS   Continuous Infusions: . 0.45 % NaCl with KCl 20 mEq / L 125 mL/hr at 09/22/18 0523  . diphenhydrAMINE     PRN Meds:.acetaminophen **OR** acetaminophen, diphenhydrAMINE **OR** diphenhydrAMINE, naloxone **AND** sodium chloride flush, ondansetron (ZOFRAN) IV, oxyCODONE, prochlorperazine  Assessment/Plan: Principal Problem:   Sickle cell pain crisis ([place]) Active Problems:   Morbidly obese (HCC)   Hypokalemia  Sickle cell disease with pain crisis: Weaning IV Dilaudid via PCA.  Settings changed to 0.5 mg, 10-minute lockout, and 3 mg/h. Continue Toradol 15 mg IV every 6 hours Continue IV fluids at [facility] Oxycodone 10 mg every 4 hours as needed for severe breakthrough pain. Reevaluate pain scale regularly Maintain oxygen saturation above 90%, 2 L supplemental oxygen as needed  Chronic pain syndrome: Continue OxyContin 10 mg every 12 hours  Sickle cell anemia: Hemoglobin stable.  No clinical indication for blood transfusion.  Continue to monitor closely.  Morbid obesity:   Heart healthy diet.  Hypokalemia: Potassium repleted.  Repeat BMP in a.m.  Code Status: Full Code Family Communication: N/A Disposition Plan: Not yet ready for discharge  [place], MSN, FNP-C Patient [place] [address] [place], [place] [address] [phone]  If 5PM-7AM, please contact night-coverage.  09/22/2018, 12:40 PM  LOS: 2 days

## 2018-09-23 LAB — BASIC METABOLIC PANEL
Anion gap: 8 (ref 5–15)
BUN: 6 mg/dL (ref 6–20)
CO2: 26 mmol/L (ref 22–32)
Calcium: 8.9 mg/dL (ref 8.9–10.3)
Chloride: 102 mmol/L (ref 98–111)
Creatinine, Ser: 0.64 mg/dL (ref 0.44–1.00)
GFR calc Af Amer: >60 mL/min (ref >60)
GFR calc non Af Amer: >60 mL/min (ref >60)
Glucose, Bld: 88 mg/dL (ref 70–99)
Potassium: 3.8 mmol/L (ref 3.5–5.1)
Sodium: 136 mmol/L (ref 135–145)

## 2018-09-23 LAB — CBC
HCT: 33.7 % — ABNORMAL LOW (ref 36.0–46.0)
Hemoglobin: 11.6 g/dL — ABNORMAL LOW (ref 12.0–15.0)
MCH: 25.1 pg — ABNORMAL LOW (ref 26.0–34.0)
MCHC: 34.4 g/dL (ref 30.0–36.0)
MCV: 72.8 fL — ABNORMAL LOW (ref 80.0–100.0)
Platelets: 185 10*3/uL (ref 150–400)
RBC: 4.63 MIL/uL (ref 3.87–5.11)
RDW: 13.2 % (ref 11.5–15.5)
WBC: 5.3 10*3/uL (ref 4.0–10.5)
nRBC: 0 % (ref 0.0–0.2)

## 2018-09-23 MED ORDER — OXYCODONE HCL 5 MG PO TABS
15.0000 mg | ORAL_TABLET | ORAL | Status: DC | PRN
  Administered 2018-09-23 – 2018-09-25 (×11): 15 mg via ORAL
  Filled 2018-09-23 (×12): qty 3

## 2018-09-23 MED ORDER — HYDROMORPHONE 1 MG/ML IV SOLN
INTRAVENOUS | Status: DC
  Administered 2018-09-23: 4.2 mg via INTRAVENOUS
  Administered 2018-09-23: 2.5 mg via INTRAVENOUS
  Administered 2018-09-23: 30 mg via INTRAVENOUS
  Administered 2018-09-24: 1 mg via INTRAVENOUS
  Administered 2018-09-24: 8 mg via INTRAVENOUS
  Administered 2018-09-24: 1.5 mg via INTRAVENOUS
  Administered 2018-09-24: 3.5 mg via INTRAVENOUS
  Administered 2018-09-24: 30 mg via INTRAVENOUS
  Administered 2018-09-25: 4 mg via INTRAVENOUS
  Administered 2018-09-25: 0.5 mg via INTRAVENOUS
  Filled 2018-09-23 (×2): qty 30

## 2018-09-23 NOTE — Progress Notes (Signed)
Patient's MEWS score was  Two(yellow). [place]  notified, MEWS protocol followed.

## 2018-09-23 NOTE — Progress Notes (Signed)
Subjective: Stephanie Burch, a 38 year old female with a medical history significant for sickle cell disease, chronic pain syndrome, opiate dependence, morbid obesity was admitted for sickle cell pain crisis.  Patient states that pain intensity is 7/10 primarily to lower extremities.  Patient states it is been difficult to get out of bed due to increased pain, pain is worsened with weightbearing.  She states that she cannot manage at current pain intensity.  Her goal is less than 5/10. Headache, chest pain, shortness of breath, heart palpitations, paresthesias, dizziness, nausea, vomiting, or diarrhea.  Objective:  Vital signs in last 24 hours:  Vitals:   09/23/18 0059 09/23/18 0400 09/23/18 0730 09/23/18 0804  BP:  134/79    Pulse:  92    Resp: 14 18 15 12   Temp:  98.8 F (37.1 C)    TempSrc:  Oral    SpO2: 100% 95% 100% 100%  Weight:      Height:        Intake/Output from previous day:   Intake/Output Summary (Last 24 hours) at 09/23/2018 1131 Last data filed at 09/22/2018 1816 Gross per 24 hour  Intake 1554.39 ml  Output -  Net 1554.39 ml    Physical Exam: General: Alert, awake, oriented x3, in no acute distress.  HEENT: [place]/AT PEERL, EOMI Neck: Trachea midline,  no masses, no thyromegal,y no JVD, no carotid bruit OROPHARYNX:  Moist, No exudate/ erythema/lesions.  Heart: Regular rate and rhythm, without murmurs, rubs, gallops, PMI non-displaced, no heaves or thrills on palpation.  Lungs: Clear to auscultation, no wheezing or rhonchi noted. No increased vocal fremitus resonant to percussion  Abdomen: Soft, nontender, nondistended, positive bowel sounds, no masses no hepatosplenomegaly noted..  Neuro: No focal neurological deficits noted cranial nerves II through XII grossly intact. DTRs 2+ bilaterally upper and lower extremities. Strength 5 out of 5 in bilateral upper and lower extremities. Musculoskeletal: No warm swelling or erythema around joints, no spinal tenderness  noted. Psychiatric: Patient alert and oriented x3, good insight and cognition, good recent to remote recall. Lymph node survey: No cervical axillary or inguinal lymphadenopathy noted.  Lab Results:  Basic Metabolic Panel:    Component Value Date/Time   NA 136 09/23/2018 0727   NA 142 09/11/2017 1431   K 3.8 09/23/2018 0727   CL 102 09/23/2018 0727   CO2 26 09/23/2018 0727   BUN 6 09/23/2018 0727   BUN 8 09/11/2017 1431   CREATININE 0.64 09/23/2018 0727   GLUCOSE 88 09/23/2018 0727   CALCIUM 8.9 09/23/2018 0727   CBC:    Component Value Date/Time   WBC 5.3 09/23/2018 0727   HGB 11.6 (L) 09/23/2018 0727   HGB 13.0 09/11/2017 1431   HCT 33.7 (L) 09/23/2018 0727   HCT 38.4 09/11/2017 1431   PLT 185 09/23/2018 0727   PLT 258 09/11/2017 1431   MCV 72.8 (L) 09/23/2018 0727   MCV 75 (L) 09/11/2017 1431   NEUTROABS 6.1 09/20/2018 1656   NEUTROABS 3.9 09/11/2017 1431   LYMPHSABS 2.3 09/20/2018 1656   LYMPHSABS 2.2 09/11/2017 1431   MONOABS 0.7 09/20/2018 1656   EOSABS 0.1 09/20/2018 1656   EOSABS 0.1 09/11/2017 1431   BASOSABS 0.0 09/20/2018 1656   BASOSABS 0.0 09/11/2017 1431    Recent Results (from the past 240 hour(s))  SARS CORONAVIRUS 2 Nasal Swab Aptima Multi Swab     Status: None   Collection Time: 09/20/18 10:19 PM   Specimen: Aptima Multi Swab; Nasal Swab  Result Value Ref Range Status  SARS Coronavirus 2 NEGATIVE NEGATIVE Final    Comment: (NOTE) SARS-CoV-2 target nucleic acids are NOT DETECTED. The SARS-CoV-2 RNA is generally detectable in upper and lower respiratory specimens during the acute phase of infection. Negative results do not preclude SARS-CoV-2 infection, do not rule out co-infections with other pathogens, and should not be used as the sole basis for treatment or other patient management decisions. Negative results must be combined with clinical observations, patient history, and epidemiological information. The expected result is  Negative. Fact Sheet for Patients: HairSlick.nohttps://www.fda.gov/media/138098/download Fact Sheet for Healthcare Providers: quierodirigir.comhttps://www.fda.gov/media/138095/download This test is not yet approved or cleared by the [place] FDA and  has been authorized for detection and/or diagnosis of SARS-CoV-2 by FDA under an Emergency Use Authorization (EUA). This EUA will remain  in effect (meaning this test can be used) for the duration of the COVID-19 declaration under Section 56 4(b)(1) of the Act, 21 U.S.C. section 360bbb-3(b)(1), unless the authorization is terminated or revoked sooner. Performed at [facility], [address] [place], [place] [address]     Studies/Results: No results found.  Medications: Scheduled Meds: . folic acid  1 mg Oral Daily  . HYDROmorphone   Intravenous Q4H  . hydroxyurea  2,000 mg Oral Daily  . oxyCODONE  10 mg Oral Q12H  . sodium chloride flush  3 mL Intravenous Once  . voxelotor  1,500 mg Oral QHS   Continuous Infusions: . 0.45 % NaCl with KCl 20 mEq / L 20 mL/hr at 09/22/18 1319  . diphenhydrAMINE     PRN Meds:.acetaminophen **OR** acetaminophen, diphenhydrAMINE **OR** diphenhydrAMINE, naloxone **AND** sodium chloride flush, ondansetron (ZOFRAN) IV, oxyCODONE, prochlorperazine   Assessment/Plan: Principal Problem:   Sickle cell pain crisis (HCC) Active Problems:   Morbidly obese (HCC)   Hypokalemia  Sickle cell disease with pain crisis: Continue weaning IV Dilaudid via PCA. Continue Toradol 15 mg every 6 hours [facility] Increase oxycodone to 15 mg every 4 hours as needed for moderate to severe breakthrough pain. Reevaluate pain scale regularly Maintain oxygen saturation above 90%, 2 L supplemental oxygen as needed  Chronic pain syndrome:  Continue OxyContin 10 mg every 12 hours  Sickle cell anemia: Hemoglobin stable.  No clinical indication for blood transfusion.  Continue to monitor closely.  Morbid obesity: Heart  healthy diet.  Hypokalemia: Resolved.  Continue to monitor closely.   Code Status: Full Code Family Communication: N/A Disposition Plan: Not yet ready for discharge  Sherran Margolis Rennis PettyMoore Chelli Yerkes  APRN, MSN, FNP-C [facility] [address]  [place], [place] [phone]  If 5PM-7AM, please contact night-coverage.  09/23/2018, 11:31 AM  LOS: 3 days

## 2018-09-24 ENCOUNTER — Other Ambulatory Visit: Payer: Self-pay | Admitting: Family Medicine

## 2018-09-24 NOTE — Progress Notes (Signed)
Patient requested for cardiac monitoring to be d/c, states that he leads irritate her skin. Spoke with hospitalist on call Dr. Grandville Silos, and he stated it was ok to d/c cardiac monitoring. Sickle cell team to follow back up on the need for cardiac monitoring tomorrow morning.

## 2018-09-24 NOTE — Progress Notes (Signed)
Subjective: Stephanie Burch, a 38 year old female with a medical history significant for sickle cell disease, chronic pain syndrome, opiate dependence, and morbid obesity was admitted for sickle cell pain crisis.  Patient states that pain intensity has improved minimally overnight.  Patient rates pain as 7/10 primarily to bilateral lower extremities.  She says that she cannot manage at home at this time.  Patient goal is less than 5/10. Patient has not been able to ambulate without increased pain to lower extremities.  She denies headache, dizziness, chest pain, shortness of breath, dysuria, nausea, vomiting, or diarrhea. Objective:  Vital signs in last 24 hours:  Vitals:   09/24/18 0410 09/24/18 0749 09/24/18 0810 09/24/18 1138  BP: (!) 106/54  111/62   Pulse: 81  86   Resp: 12 15 17 14   Temp: 98.2 F (36.8 C)  98.3 F (36.8 C)   TempSrc: Oral  Oral   SpO2: 100% 98% 100% 97%  Weight:      Height:        Intake/Output from previous day:   Intake/Output Summary (Last 24 hours) at 09/24/2018 1206 Last data filed at 09/24/2018 0836 Gross per 24 hour  Intake 510.03 ml  Output -  Net 510.03 ml    Physical Exam: General: Alert, awake, oriented x3, in no acute distress.  HEENT: [place]/AT PEERL, EOMI Neck: Trachea midline,  no masses, no thyromegal,y no JVD, no carotid bruit OROPHARYNX:  Moist, No exudate/ erythema/lesions.  Heart: Regular rate and rhythm, without murmurs, rubs, gallops, PMI non-displaced, no heaves or thrills on palpation.  Lungs: Clear to auscultation, no wheezing or rhonchi noted. No increased vocal fremitus resonant to percussion  Abdomen: Soft, nontender, nondistended, positive bowel sounds, no masses no hepatosplenomegaly noted..  Neuro: No focal neurological deficits noted cranial nerves II through XII grossly intact. DTRs 2+ bilaterally upper and lower extremities. Strength 5 out of 5 in bilateral upper and lower extremities. Musculoskeletal: No warm swelling or  erythema around joints, no spinal tenderness noted. Psychiatric: Patient alert and oriented x3, good insight and cognition, good recent to remote recall. Lymph node survey: No cervical axillary or inguinal lymphadenopathy noted.  Lab Results:  Basic Metabolic Panel:    Component Value Date/Time   NA 136 09/23/2018 0727   NA 142 09/11/2017 1431   K 3.8 09/23/2018 0727   CL 102 09/23/2018 0727   CO2 26 09/23/2018 0727   BUN 6 09/23/2018 0727   BUN 8 09/11/2017 1431   CREATININE 0.64 09/23/2018 0727   GLUCOSE 88 09/23/2018 0727   CALCIUM 8.9 09/23/2018 0727   CBC:    Component Value Date/Time   WBC 5.3 09/23/2018 0727   HGB 11.6 (L) 09/23/2018 0727   HGB 13.0 09/11/2017 1431   HCT 33.7 (L) 09/23/2018 0727   HCT 38.4 09/11/2017 1431   PLT 185 09/23/2018 0727   PLT 258 09/11/2017 1431   MCV 72.8 (L) 09/23/2018 0727   MCV 75 (L) 09/11/2017 1431   NEUTROABS 6.1 09/20/2018 1656   NEUTROABS 3.9 09/11/2017 1431   LYMPHSABS 2.3 09/20/2018 1656   LYMPHSABS 2.2 09/11/2017 1431   MONOABS 0.7 09/20/2018 1656   EOSABS 0.1 09/20/2018 1656   EOSABS 0.1 09/11/2017 1431   BASOSABS 0.0 09/20/2018 1656   BASOSABS 0.0 09/11/2017 1431    Recent Results (from the past 240 hour(s))  SARS CORONAVIRUS 2 Nasal Swab Aptima Multi Swab     Status: None   Collection Time: 09/20/18 10:19 PM   Specimen: Aptima Multi Swab; Nasal  Swab  Result Value Ref Range Status   SARS Coronavirus 2 NEGATIVE NEGATIVE Final    Comment: (NOTE) SARS-CoV-2 target nucleic acids are NOT DETECTED. The SARS-CoV-2 RNA is generally detectable in upper and lower respiratory specimens during the acute phase of infection. Negative results do not preclude SARS-CoV-2 infection, do not rule out co-infections with other pathogens, and should not be used as the sole basis for treatment or other patient management decisions. Negative results must be combined with clinical observations, patient history, and epidemiological  information. The expected result is Negative. Fact Sheet for Patients: HairSlick.nohttps://www.fda.gov/media/138098/download Fact Sheet for Healthcare Providers: quierodirigir.comhttps://www.fda.gov/media/138095/download This test is not yet approved or cleared by the [place] FDA and  has been authorized for detection and/or diagnosis of SARS-CoV-2 by FDA under an Emergency Use Authorization (EUA). This EUA will remain  in effect (meaning this test can be used) for the duration of the COVID-19 declaration under Section 56 4(b)(1) of the Act, 21 U.S.C. section 360bbb-3(b)(1), unless the authorization is terminated or revoked sooner. Performed at [facility], [address] [place], [place] [address]     Studies/Results: No results found.  Medications: Scheduled Meds: . folic acid  1 mg Oral Daily  . HYDROmorphone   Intravenous Q4H  . hydroxyurea  2,000 mg Oral Daily  . oxyCODONE  10 mg Oral Q12H  . sodium chloride flush  3 mL Intravenous Once  . voxelotor  1,500 mg Oral QHS   Continuous Infusions: . 0.45 % NaCl with KCl 20 mEq / L Stopped (09/23/18 2003)  . diphenhydrAMINE     PRN Meds:.acetaminophen **OR** acetaminophen, diphenhydrAMINE **OR** diphenhydrAMINE, naloxone **AND** sodium chloride flush, ondansetron (ZOFRAN) IV, oxyCODONE, prochlorperazine  Consultants:  None  Procedures:  None  Antibiotics:  None  Assessment/Plan: Principal Problem:   Sickle cell pain crisis (HCC) Active Problems:   Morbidly obese (HCC)   Hypokalemia  Sickle cell disease with pain crisis: Continue IV Dilaudid via PCA, weaning. Continue Toradol 15 mg IV every 6 hours [facility] to [facility] Oxycodone 15 mg every 4 hours as needed for severe breakthrough pain. Reevaluate pain scale regularly  Chronic pain syndrome: Stable.  Continue home medications.  Sickle cell anemia: Hemoglobin stable.  No clinical indication for blood transfusion.  Continue to monitor closely.  Morbid obesity: Heart  healthy diet. Code Status: Full Code Family Communication: N/A Disposition Plan: Not yet ready for discharge  Hang Ammon Rennis PettyMoore Wilman Tucker  APRN, MSN, FNP-C [facility] Center [facility] [address]  [place], [place] [address] [phone] If 5PM-7AM, please contact night-coverage.  09/24/2018, 12:06 PM  LOS: 4 days

## 2018-09-25 DIAGNOSIS — G894 Chronic pain syndrome: Secondary | ICD-10-CM

## 2018-09-25 NOTE — Discharge Summary (Signed)
Physician Discharge Summary  Stephanie Burch UYQ:[id number] DOB: [birth year] DOA: 09/20/2018  PCP: Stephanie Glatter, FNP  Admit date: 09/20/2018  Discharge date: 09/25/2018  Discharge Diagnoses:  Principal Problem:   Sickle cell pain crisis [facility]) Active Problems:   Morbidly obese (HCC)   Hypokalemia  Discharge Condition: Stable  Disposition:  Follow-up Information    Stephanie Glatter, FNP. Call in 2 day(s).   Specialty: Family Medicine Contact information: [place]  [address] [phone]          Pt is discharged home in good condition and is to follow up with Stephanie Glatter, FNP this week to have labs evaluated. Stephanie Burch is instructed to increase activity slowly and balance with rest for the next few days, and use prescribed medication to complete treatment of pain  Diet: Regular Wt Readings from Last 3 Encounters:  09/20/18 (!) 169.3 kg  09/14/18 123.4 kg  09/02/18 126 kg    History of present illness:  Stephanie Burch is a 37 y.o. female with medical history significant of sickle cell disease, morbid obesity who is coming to the emergency department due to progressively worse lower extremities pain consistent with her sickle cell pain crisis symptoms that has not gotten much relief with her oral meds at home.  She denies fever, chills, rhinorrhea, sore throat, dyspnea, wheezing or hemoptysis.  No chest pain, palpitations, dizziness, diaphoresis, PND, orthopnea or pitting edema of the lower extremities.  No abdominal pain, vomiting, diarrhea, melena or hematochezia.  She was nauseous earlier due to hydromorphone.  She occasionally gets constipation due to medications.  No dysuria, frequency or hematuria.  Occasional pruritus.  No polyuria, polydipsia, polyphagia or blurred vision.  ED Course: Initial vital signs temperature 99.9 F, pulse 139, respirations 20, blood pressure 162/105 mmHg and O2 sat 99% on room air.  The patient received IV fluids  and 12 mg of hydromorphone in the emergency department.  Stephanie Burch count is 9.1, hemoglobin 13.3 g/dL and platelets 262.  Reticulocyte count is 2.6% and total reticulocyte count 138.8.  Immature reticulocyte cholecystitis 16.8%.  CMP shows a potassium of 3.4 mmol/L, glucose of 110 mg/dL and AST low at 14 units/L, the rest of the values are within normal limits.  Hospital Course:  Patient was admitted for sickle cell pain crisis and managed appropriately with IVF, IV Dilaudid via PCA and IV Toradol, as well as other adjunct therapies per sickle cell pain management protocols.  Patient slowly improved in her symptoms, she was gradually transitioned from Dilaudid IV to her home pain medications. She began to slowly ambulate, and to tolerate p.o. intake with no restrictions. She was hemodynamically stable throughout admissions. Hemoglobin remained stable at baseline and patient did not require any blood transfusion during this admission. With this improvement, patient was discharged home today in a hemodynamically stable condition. She will follow-up with her PCP within 1 week of this discharge.  She does not require any medication refill at the time of discharge.  Discharge Exam: Vitals:   09/25/18 0824 09/25/18 1313  BP:    Pulse:    Resp: 13 12  Temp:    SpO2: 100% 100%   Vitals:   09/25/18 0400 09/25/18 0408 09/25/18 0824 09/25/18 1313  BP:  (!) 106/48    Pulse:  95    Resp: 16 14 13 12   Temp:  98.2 F (36.8 C)    TempSrc:  Oral    SpO2: 96% 100% 100% 100%  Weight:  Height:        General appearance : Awake, alert, not in any distress. Speech Clear. Not toxic looking HEENT: Atraumatic and Normocephalic, pupils equally reactive to light and accomodation Neck: Supple, no JVD. No cervical lymphadenopathy.  Chest: Good air entry bilaterally, no added sounds  CVS: S1 S2 regular, no murmurs.  Abdomen: Bowel sounds present, Non tender and not distended with no gaurding, rigidity or  rebound. Extremities: B/L Lower Ext shows no edema, both legs are warm to touch Neurology: Awake alert, and oriented X 3, CN II-XII intact, Non focal Skin: No Rash  Discharge Instructions  Discharge Instructions    Diet - low sodium heart healthy   Complete by: As directed    Increase activity slowly   Complete by: As directed      Allergies as of 09/25/2018   No Known Allergies     Medication List    TAKE these medications   docusate sodium 100 MG capsule Commonly known as: Colace Take 1 capsule (100 mg total) by mouth 2 (two) times daily.   folic acid 1 MG tablet Commonly known as: FOLVITE Take 1 tablet (1 mg total) by mouth daily.   hydroxyurea 500 MG capsule Commonly known as: Hydrea Take 4 capsules (2,000 mg total) by mouth daily. May take with food to minimize GI side effects.   ibuprofen 800 MG tablet Commonly known as: ADVIL Take 1 tablet (800 mg total) by mouth every 8 (eight) hours as needed for headache, mild pain, moderate pain or cramping.   naloxone 0.4 MG/ML injection Commonly known as: NARCAN As needed   ondansetron 4 MG tablet Commonly known as: Zofran Take 1 tablet (4 mg total) by mouth every 8 (eight) hours as needed for nausea or vomiting.   Oxbryta 500 MG Tabs tablet Generic drug: voxelotor Take 1,500 mg by mouth daily. For Sickle Cell Anemia   Oxycodone HCl 10 MG Tabs Take 2 tablets (total dose = 20 mg), every 4 hours as needed for pain.   tiZANidine 4 MG capsule Commonly known as: Zanaflex Take 1 capsule (4 mg total) by mouth 3 (three) times daily.   Vitamin D (Ergocalciferol) 1.25 MG (50000 UT) Caps capsule Commonly known as: DRISDOL Take 1 capsule (50,000 Units total) by mouth every 7 (seven) days.   Xtampza ER 13.5 MG C12a Generic drug: oxyCODONE ER Take 13.5 mg by mouth every 12 (twelve) hours.       The results of significant diagnostics from this hospitalization (including imaging, microbiology, ancillary and laboratory)  are listed below for reference.    Significant Diagnostic Studies: No results found.  Microbiology: Recent Results (from the past 240 hour(s))  SARS CORONAVIRUS 2 Nasal Swab Aptima Multi Swab     Status: None   Collection Time: 09/20/18 10:19 PM   Specimen: Aptima Multi Swab; Nasal Swab  Result Value Ref Range Status   SARS Coronavirus 2 NEGATIVE NEGATIVE Final    Comment: (NOTE) SARS-CoV-2 target nucleic acids are NOT DETECTED. The SARS-CoV-2 RNA is generally detectable in upper and lower respiratory specimens during the acute phase of infection. Negative results do not preclude SARS-CoV-2 infection, do not rule out co-infections with other pathogens, and should not be used as the sole basis for treatment or other patient management decisions. Negative results must be combined with clinical observations, patient history, and epidemiological information. The expected result is Negative. Fact Sheet for Patients: HairSlick.nohttps://www.fda.gov/media/138098/download Fact Sheet for Healthcare Providers: quierodirigir.comhttps://www.fda.gov/media/138095/download This test is not yet approved or cleared  by the [place] FDA and  has been authorized for detection and/or diagnosis of SARS-CoV-2 by FDA under an Emergency Use Authorization (EUA). This EUA will remain  in effect (meaning this test can be used) for the duration of the COVID-19 declaration under Section 56 4(b)(1) of the Act, 21 U.S.C. section 360bbb-3(b)(1), unless the authorization is terminated or revoked sooner. Performed at [facility], [address] [place], [place] [address]      Labs: Basic Metabolic Panel: Recent Labs  Lab 09/20/18 1656 09/20/18 2326 09/23/18 0727  NA 144  --  136  K 3.4*  --  3.8  CL 111  --  102  CO2 25  --  26  GLUCOSE 110*  --  88  BUN 9  --  6  CREATININE 0.75  --  0.64  CALCIUM 9.7  --  8.9  MG  --  2.1  --   PHOS  --  5.8*  --    Liver Function Tests: Recent Labs  Lab 09/20/18 1656   AST 14*  ALT 13  ALKPHOS 64  BILITOT 0.7  PROT 7.7  ALBUMIN 4.3   No results for input(s): LIPASE, AMYLASE in the last 168 hours. No results for input(s): AMMONIA in the last 168 hours. CBC: Recent Labs  Lab 09/20/18 1656 09/23/18 0727  WBC 9.1 5.3  NEUTROABS 6.1  --   HGB 13.3 11.6*  HCT 38.7 33.7*  MCV 72.2* 72.8*  PLT 262 185   Cardiac Enzymes: No results for input(s): CKTOTAL, CKMB, CKMBINDEX, TROPONINI in the last 168 hours. BNP: Invalid input(s): POCBNP CBG: No results for input(s): GLUCAP in the last 168 hours.  Time coordinating discharge: 50 minutes  Signed:  Agata Lucente  [facility] 09/25/2018, 1:28 PM

## 2018-09-25 NOTE — Discharge Instructions (Signed)
Sickle Cell Anemia, Adult ° °Sickle cell anemia is a condition in which red blood cells have an abnormal “sickle” shape. Red blood cells carry oxygen through the body. Sickle-shaped red blood cells do not live as long as normal red blood cells. They also clump together and block blood from flowing through the blood vessels. This condition prevents the body from getting enough oxygen. Sickle cell anemia causes organ damage and pain. It also increases the risk of infection. °What are the causes? °This condition is caused by a gene that is passed from parent to child (inherited). Receiving two copies of the gene causes the disease. Receiving one copy causes the "trait," which means that symptoms are milder or not present. °What increases the risk? °This condition is more likely to develop if your ancestors were from [place], the [place], [place] or [place], the [place], [place], or the [place]. °What are the signs or symptoms? °Symptoms of this condition include: °· Episodes of pain (crises), especially in the hands and feet, joints, back, chest, or abdomen. The pain can be triggered by: °? An illness, especially if there is dehydration. °? Doing an activity with great effort (overexertion). °? Exposure to extreme temperature changes. °? High altitude. °· Fatigue. °· Shortness of breath or difficulty breathing. °· Dizziness. °· Pale skin or yellowed skin (jaundice). °· Frequent bacterial infections. °· Pain and swelling in the hands and feet (hand-food syndrome). °· Prolonged, painful erection of the penis (priapism). °· Acute chest syndrome. Symptoms of this include: °? Chest pain. °? Fever. °? Cough. °? Fast breathing. °· Stroke. °· Decreased activity. °· Loss of appetite. °· Change in behavior. °· Headaches. °· Seizures. °· Vision changes. °· Skin ulcers. °· Heart disease. °· High blood pressure. °· Gallstones. °· Liver and kidney problems. °How is this diagnosed? °This condition is diagnosed with  blood tests that check for the gene that causes this condition. °How is this treated? °There is no cure for most cases of this condition. Treatment focuses on managing your symptoms and preventing complications of the disease. Your health care provider will work with you to identify the best treatment options for you based on an assessment of your condition. Treatment may include: °· Medicines, including: °? Pain medicines. °? Antibiotic medicines for infection. °? Medicines to increase the production of a protein in red blood cells that helps carry oxygen in the body (hemoglobin). °· Fluids to treat pain and swelling. °· Oxygen to treat acute chest syndrome. °· Blood transfusions to treat symptoms such as fatigue, stroke, and acute chest syndrome. °· Massage and physical therapy for pain. °· Regular tests to monitor your condition, such as blood tests, X-rays, CT scans, MRI scans, ultrasounds, and lung function tests. These should be done every 3-12 months, depending on your age. °· Hematopoietic stem cell transplant. This is a procedure to replace abnormal stem cells with healthy stem cells from a donor's bone marrow. Stem cells are cells that can develop into blood cells, and bone marrow is the spongy tissue inside the bones. °Follow these instructions at home: °Medicines °· Take over-the-counter and prescription medicines only as told by your health care provider. °· If you were prescribed an antibiotic medicine, take it as told by your health care provider. Do not stop taking the antibiotic even if you start to feel better. °· If you develop a fever, do not take medicines to reduce the fever right away. This could cover up another problem. Notify your health care provider. °Managing   pain, stiffness, and swelling °· Try these methods to help ease your pain: °? Using a heating pad. °? Taking a warm bath. °? Distracting yourself, such as by watching TV. °Eating and drinking °· Drink enough fluid to keep your urine  clear or pale yellow. Drink more in hot weather and during exercise. °· Limit or avoid drinking alcohol. °· Eat a balanced and nutritious diet. Eat plenty of fruits, vegetables, whole grains, and lean protein. °· Take vitamins and supplements as directed by your health care provider. °Traveling °· When traveling, keep these with you: °? Your medical information. °? The names of your health care providers. °? Your medicines. °· If you have to travel by air, ask about precautions you should take. °Activity °· Get plenty of rest. °· Avoid activities that will lower your oxygen levels, such as exercising vigorously. °General instructions °· Do not use any products that contain nicotine or tobacco, such as cigarettes and e-cigarettes. They lower blood oxygen levels. If you need help quitting, ask your health care provider. °· Consider wearing a medical alert bracelet. °· Avoid high altitudes. °· Avoid extreme temperatures and extreme temperature changes. °· Keep all follow-up visits as told by your health care provider. This is important. °Contact a health care provider if: °· You develop joint pain. °· Your feet or hands swell or have pain. °· You have fatigue. °Get help right away if: °· You have symptoms of infection. These include: °? Fever. °? Chills. °? Extreme tiredness. °? Irritability. °? Poor eating. °? Vomiting. °· You feel dizzy or faint. °· You have new abdominal pain, especially on the left side near the stomach area. °· You develop priapism. °· You have numbness in your arms or legs or have trouble moving them. °· You have trouble talking. °· You develop pain that cannot be controlled with medicine. °· You become short of breath. °· You have rapid breathing. °· You have a persistent cough. °· You have pain in your chest. °· You develop a severe headache or stiff neck. °· You feel bloated without eating or after eating a small amount of food. °· Your skin is pale. °· You suddenly lose  vision. °Summary °· Sickle cell anemia is a condition in which red blood cells have an abnormal “sickle” shape. This disease can cause organ damage and chronic pain, and it can raise your risk of infection. °· Sickle cell anemia is a genetic disorder. °· Treatment focuses on managing your symptoms and preventing complications of the disease. °· Get medical help right away if you have any signs of infection, such as a fever. °This information is not intended to replace advice given to you by your health care provider. Make sure you discuss any questions you have with your health care provider. °Document Released: 04/30/2005 Document Revised: 05/14/2018 Document Reviewed: 02/26/2016 °Elsevier Patient Education © 2020 [employer] Inc[employer] ° °Chronic Pain, Adult °Chronic pain is a type of pain that lasts or keeps coming back (recurs) for at least six months. You may have chronic headaches, abdominal pain, or body pain. Chronic pain may be related to an illness, such as fibromyalgia or complex regional pain syndrome. Sometimes the cause of chronic pain is not known. °Chronic pain can make it hard for you to do daily activities. If not treated, chronic pain can lead to other health problems, including anxiety and depression. Treatment depends on the cause and severity of your pain. You may need to work with a pain specialist to come   up with a treatment plan. The plan may include medicine, counseling, and physical therapy. Many people benefit from a combination of two or more types of treatment to control their pain. °Follow these instructions at home: °Lifestyle °· Consider keeping a pain diary to share with your health care providers. °· Consider talking with a mental health care provider (psychologist) about how to cope with chronic pain. °· Consider joining a chronic pain support group. °· Try to control or lower your stress levels. Talk to your health care provider about strategies to do this. °General instructions ° °· Take  over-the-counter and prescription medicines only as told by your health care provider. °· Follow your treatment plan as told by your health care provider. This may include: °? Gentle, regular exercise. °? Eating a healthy diet that includes foods such as vegetables, fruits, fish, and lean meats. °? Cognitive or behavioral therapy. °? Working with a physical therapist. °? Meditation or yoga. °? Acupuncture or massage therapy. °? Aroma, color, light, or sound therapy. °? Local electrical stimulation. °? Shots (injections) of numbing or pain-relieving medicines into the spine or the area of pain. °· Check your pain level as told by your health care provider. Ask your health care provider if you should use a pain scale. °· Learn as much as you can about how to manage your chronic pain. Ask your health care provider if an intensive pain rehabilitation program or a chronic pain specialist would be helpful. °· Keep all follow-up visits as told by your health care provider. This is important. °Contact a health care provider if: °· Your pain gets worse. °· You have new pain. °· You have trouble sleeping. °· You have trouble doing your normal activities. °· Your pain is not controlled with treatment. °· Your have side effects from pain medicine. °· You feel weak. °Get help right away if: °· You lose feeling or have numbness in your body. °· You lose control of bowel or bladder function. °· Your pain suddenly gets much worse. °· You develop shaking or chills. °· You develop confusion. °· You develop chest pain. °· You have trouble breathing or shortness of breath. °· You pass out. °· You have thoughts about hurting yourself or others. °This information is not intended to replace advice given to you by your health care provider. Make sure you discuss any questions you have with your health care provider. °Document Released: 10/12/2001 Document Revised: 01/02/2017 Document Reviewed: 07/10/2015 °Elsevier Patient Education © 2020  Elsevier Inc. ° °

## 2018-09-26 NOTE — Plan of Care (Signed)
Discharge instructions reviewed with patient, questions answered, verbalized understanding.  Patient transported via wheelchair to main entrance to be taken home by significant other.

## 2018-09-27 ENCOUNTER — Telehealth: Payer: Self-pay | Admitting: Family Medicine

## 2018-09-28 NOTE — Telephone Encounter (Signed)
Done called left message to set up appointment

## 2018-09-29 ENCOUNTER — Institutional Professional Consult (permissible substitution): Payer: Medicaid Other | Admitting: Clinical

## 2018-09-30 ENCOUNTER — Institutional Professional Consult (permissible substitution): Payer: Medicaid Other | Admitting: Clinical

## 2018-10-01 ENCOUNTER — Encounter: Payer: Self-pay | Admitting: Family Medicine

## 2018-10-01 ENCOUNTER — Ambulatory Visit (INDEPENDENT_AMBULATORY_CARE_PROVIDER_SITE_OTHER): Payer: Medicaid Other | Admitting: Family Medicine

## 2018-10-01 ENCOUNTER — Other Ambulatory Visit: Payer: Self-pay

## 2018-10-01 ENCOUNTER — Telehealth: Payer: Self-pay

## 2018-10-01 VITALS — BP 142/85 | HR 80 | Temp 98.6°F | Ht 65.0 in | Wt 275.6 lb

## 2018-10-01 DIAGNOSIS — F1190 Opioid use, unspecified, uncomplicated: Secondary | ICD-10-CM | POA: Diagnosis not present

## 2018-10-01 DIAGNOSIS — G894 Chronic pain syndrome: Secondary | ICD-10-CM

## 2018-10-01 DIAGNOSIS — Z09 Encounter for follow-up examination after completed treatment for conditions other than malignant neoplasm: Secondary | ICD-10-CM | POA: Diagnosis not present

## 2018-10-01 DIAGNOSIS — D571 Sickle-cell disease without crisis: Secondary | ICD-10-CM

## 2018-10-01 DIAGNOSIS — F419 Anxiety disorder, unspecified: Secondary | ICD-10-CM

## 2018-10-01 MED ORDER — DULOXETINE HCL 20 MG PO CPEP: 20.0000 mg | ORAL_CAPSULE | Freq: Every day | ORAL | 2 refills | Status: DC

## 2018-10-01 MED ORDER — XTAMPZA ER 13.5 MG PO C12A: 13.5000 mg | EXTENDED_RELEASE_CAPSULE | Freq: Two times a day (BID) | ORAL | 0 refills | Status: DC

## 2018-10-01 MED ORDER — OXYCODONE HCL 10 MG PO TABS: ORAL_TABLET | ORAL | 0 refills | Status: DC

## 2018-10-01 NOTE — Progress Notes (Signed)
[facility]  Hospital Follow Up   Subjective:  Patient ID: Stephanie Burch, female    DOB: 1980/02/20  Age: 38 y.o. MRN: [id number]  CC:  Chief Complaint  Patient presents with  . Hospitalization Follow-up    follow up  from hospital  pain     HPI Stephanie Burch is a 38 year old female who  presents for Follow Up today.  Past Medical History:  Diagnosis Date  . Sickle cell anemia (HCC)    Current Status: Since her last office visit, she is doing well with no complaints. She states that she has pain in her legs. She rates her pain today at 5/10, but feels that she needs [facility]. She has not had a hospital visit for Sickle Cell Crisis since 09/20/2018 where she was treated and discharged on 09/25/2018. She is currently taking all medications as prescribed and staying well hydrated. She reports occasional nausea, constipation, dizziness and headaches. She is taking medications as prescribed, and trying not to take more than prescribed, now realizing that she has a chronic condition and will have chronic pain. She is using other techniques to control her pain. She has increased nausea. Her anxiety is moderate today, r/t her continual pain management. She denies suicidal ideations, homicidal ideations, or auditory hallucinations.  She denies fevers, chills, fatigue, recent infections, weight loss, and night sweats. She has not had any visual changes, and falls. No chest pain, heart palpitations, cough and shortness of breath reported. No reports of GI problems such as vomiting, and diarrhea.  She has no reports of blood in stools, dysuria and hematuria.  No past surgical history on file.  Family History  Problem Relation Age of Onset  . Hypertension Mother   . Sickle cell trait Mother   . Diabetes Father   . Sickle cell trait Father     Social History   Socioeconomic History  . Marital status: Single   Spouse name: Not on file  . Number of children: Not on file  . Years of education: Not on file  . Highest education level: Not on file  Occupational History  . Not on file  Social Needs  . Financial resource strain: Not on file  . Food insecurity    Worry: Not on file    Inability: Not on file  . Transportation needs    Medical: Not on file    Non-medical: Not on file  Tobacco Use  . Smoking status: Never Smoker  . Smokeless tobacco: Never Used  Substance and Sexual Activity  . Alcohol use: Never    Frequency: Never  . Drug use: Never  . Sexual activity: Yes    Birth control/protection: None  Lifestyle  . Physical activity    Days per week: Not on file    Minutes per session: Not on file  . Stress: Not on file  Relationships  . Social Musician on phone: Not on file    Gets together: Not on file    Attends religious service: Not on file    Active member of club or organization: Not on file    Attends meetings of clubs or organizations: Not on file    Relationship status: Not on file  . Intimate partner violence    Fear of current or ex partner: Not on file    Emotionally abused: Not on file    Physically abused:  Not on file    Forced sexual activity: Not on file  Other Topics Concern  . Not on file  Social History Narrative  . Not on file    Outpatient Medications Prior to Visit  Medication Sig Dispense Refill  . folic acid (FOLVITE) 1 MG tablet Take 1 tablet (1 mg total) by mouth daily. 30 tablet 11  . ibuprofen (ADVIL) 800 MG tablet Take 1 tablet (800 mg total) by mouth every 8 (eight) hours as needed for headache, mild pain, moderate pain or cramping. 30 tablet 3  . naloxone (NARCAN) 0.4 MG/ML injection As needed 1 mL 0  . tiZANidine (ZANAFLEX) 4 MG capsule Take 1 capsule (4 mg total) by mouth 3 (three) times daily. 90 capsule 1  . voxelotor (OXBRYTA) 500 MG TABS tablet Take 1,500 mg by mouth daily. For Sickle Cell Anemia    . Oxycodone HCl 10 MG  TABS Take 2 tablets (total dose = 20 mg), every 4 hours as needed for pain. 180 tablet 0  . docusate sodium (COLACE) 100 MG capsule Take 1 capsule (100 mg total) by mouth 2 (two) times daily. (Patient not taking: Reported on 09/20/2018) 10 capsule 0  . hydroxyurea (HYDREA) 500 MG capsule Take 4 capsules (2,000 mg total) by mouth daily. May take with food to minimize GI side effects. (Patient not taking: Reported on 10/01/2018) 120 capsule 5  . ondansetron (ZOFRAN) 4 MG tablet Take 1 tablet (4 mg total) by mouth every 8 (eight) hours as needed for nausea or vomiting. (Patient not taking: Reported on 09/20/2018) 30 tablet 2  . Vitamin D, Ergocalciferol, (DRISDOL) 50000 units CAPS capsule Take 1 capsule (50,000 Units total) by mouth every 7 (seven) days. (Patient not taking: Reported on 10/01/2018) 5 capsule 2  . oxyCODONE ER (XTAMPZA ER) 13.5 MG C12A Take 13.5 mg by mouth every 12 (twelve) hours. (Patient not taking: Reported on 10/01/2018) 60 capsule 0   No facility-administered medications prior to visit.     No Known Allergies  ROS Review of Systems  Constitutional: Negative.   HENT: Negative.   Eyes: Negative.   Respiratory: Positive for shortness of breath (occasional).   Cardiovascular: Negative.   Gastrointestinal: Negative.   Endocrine: Negative.   Genitourinary: Negative.   Musculoskeletal: Positive for arthralgias (Generalized joinr pain,).  Skin: Negative.   Allergic/Immunologic: Negative.   Neurological: Positive for dizziness (occasional) and headaches (occasional).  Hematological: Negative.   Psychiatric/Behavioral: The patient is nervous/anxious.       Objective:    Physical Exam  Constitutional: She is oriented to person, place, and time. She appears well-developed and well-nourished.  HENT:  Head: Normocephalic and atraumatic.  Eyes: Conjunctivae are normal.  Neck: Normal range of motion. Neck supple.  Cardiovascular: Normal rate, regular rhythm, normal heart sounds  and intact distal pulses.  Pulmonary/Chest: Effort normal and breath sounds normal.  Abdominal: Soft. She exhibits distension (occasional).  Musculoskeletal: Normal range of motion.  Neurological: She is alert and oriented to person, place, and time. She has normal reflexes.  Skin: Skin is warm and dry.  Psychiatric: She has a normal mood and affect. Her behavior is normal. Judgment and thought content normal.  Nursing note and vitals reviewed.   BP (!) 142/85 (BP Location: Right Arm, Patient Position: Sitting, Cuff Size: Normal)   Pulse 80   Temp 98.6 F (37 C) (Oral)   Ht 5\' 5"  (1.651 m)   Wt 275 lb 9.6 oz (125 kg)   LMP 09/06/2018  BMI 45.86 kg/m  Wt Readings from Last 3 Encounters:  10/01/18 275 lb 9.6 oz (125 kg)  09/20/18 (!) 373 lb 3.2 oz (169.3 kg)  09/14/18 272 lb (123.4 kg)     Health Maintenance Due  Topic Date Due  . PAP SMEAR-Modifier  01/13/2004  . INFLUENZA VACCINE  09/04/2018    There are no preventive care reminders to display for this patient.  Lab Results  Component Value Date   TSH 0.862 09/11/2017   Lab Results  Component Value Date   WBC 5.3 09/23/2018   HGB 11.6 (L) 09/23/2018   HCT 33.7 (L) 09/23/2018   MCV 72.8 (L) 09/23/2018   PLT 185 09/23/2018   Lab Results  Component Value Date   NA 136 09/23/2018   K 3.8 09/23/2018   CO2 26 09/23/2018   GLUCOSE 88 09/23/2018   BUN 6 09/23/2018   CREATININE 0.64 09/23/2018   BILITOT 0.7 09/20/2018   ALKPHOS 64 09/20/2018   AST 14 (L) 09/20/2018   ALT 13 09/20/2018   PROT 7.7 09/20/2018   ALBUMIN 4.3 09/20/2018   CALCIUM 8.9 09/23/2018   ANIONGAP 8 09/23/2018   Lab Results  Component Value Date   CHOL 92 (L) 09/11/2017   Lab Results  Component Value Date   HDL 45 09/11/2017   Lab Results  Component Value Date   LDLCALC 36 09/11/2017   Lab Results  Component Value Date   TRIG 53 09/11/2017   Lab Results  Component Value Date   CHOLHDL 2.0 09/11/2017   Lab Results   Component Value Date   HGBA1C 4.1 09/11/2017      Assessment & Plan:   1. Hospital discharge follow-up  2. 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 She will return home today and continue with pain management. She will continue to take pain medications as prescribed; will continue to avoid extreme heat and cold; will continue to eat a healthy diet and drink at least 64 ounces of water daily; continue stool softener as needed; will avoid colds and flu; will continue to get plenty of sleep and rest; will continue to avoid high stressful situations and remain infection free; will continue Folic Acid 1 mg daily to avoid sickle cell crisis.  - oxyCODONE ER (XTAMPZA ER) 13.5 MG C12A; Take 13.5 mg by mouth every 12 (twelve) hours.  Dispense: 60 capsule; Refill: 0 - Oxycodone HCl 10 MG TABS; Take 2 tablets (total dose = 20 mg), every 4 hours as needed for pain.  Dispense: 180 tablet; Refill: 0  3. Chronic, continuous use of opioids - oxyCODONE ER (XTAMPZA ER) 13.5 MG C12A; Take 13.5 mg by mouth every 12 (twelve) hours.  Dispense: 60 capsule; Refill: 0 - Oxycodone HCl 10 MG TABS; Take 2 tablets (total dose = 20 mg), every 4 hours as needed for pain.  Dispense: 180 tablet; Refill: 0  4. Chronic pain syndrome - oxyCODONE ER (XTAMPZA ER) 13.5 MG C12A; Take 13.5 mg by mouth every 12 (twelve) hours.  Dispense: 60 capsule; Refill: 0 - Oxycodone HCl 10 MG TABS; Take 2 tablets (total dose = 20 mg), every 4 hours as needed for pain.  Dispense: 180 tablet; Refill: 0  5. Anxiety We will initiate anti-anxiety medication today.  - DULoxetine (CYMBALTA) 20 MG capsule; Take 1 capsule (20 mg total) by mouth daily.  Dispense: 30 capsule; Refill: 2  6. Follow up She will follow up in 2 months.  Meds ordered this encounter  Medications  . DULoxetine (CYMBALTA) 20 MG capsule    Sig: Take 1 capsule (20 mg total) by mouth daily.    Dispense:  30 capsule     Refill:  2  . oxyCODONE ER (XTAMPZA ER) 13.5 MG C12A    Sig: Take 13.5 mg by mouth every 12 (twelve) hours.    Dispense:  60 capsule    Refill:  0    Please do not refill this medication prior to 10/06/2018. Thank you.    Order Specific Question:   Supervising Provider    Answer:   Tresa Garter [id number]  . Oxycodone HCl 10 MG TABS    Sig: Take 2 tablets (total dose = 20 mg), every 4 hours as needed for pain.    Dispense:  180 tablet    Refill:  0    Please DO NOT refill this medication prior to 11/03/2018. Thank you.    Order Specific Question:   Supervising Provider    Answer:   Tresa Garter [[id number]]    No orders of the defined types were placed in this encounter.   Referral Orders  No referral(s) requested today    Kathe Becton,  MSN, FNP-BC [place], [place] [address] [phone] fax   Problem List Items Addressed This Visit      Other   Chronic pain syndrome   Relevant Medications   DULoxetine (CYMBALTA) 20 MG capsule   oxyCODONE ER (XTAMPZA ER) 13.5 MG C12A (Start on 10/06/2018)   Oxycodone HCl 10 MG TABS (Start on 10/03/2018)   Chronic, continuous use of opioids   Relevant Medications   oxyCODONE ER (XTAMPZA ER) 13.5 MG C12A (Start on 10/06/2018)   Oxycodone HCl 10 MG TABS (Start on 10/03/2018)   Hb-SS disease without crisis ([place])   Relevant Medications   oxyCODONE ER (XTAMPZA ER) 13.5 MG C12A (Start on 10/06/2018)   Oxycodone HCl 10 MG TABS (Start on 10/03/2018)    Other Visit Diagnoses    Hospital discharge follow-up    -  Primary   Anxiety       Relevant Medications   DULoxetine (CYMBALTA) 20 MG capsule   Follow up          Meds ordered this encounter  Medications  . DULoxetine (CYMBALTA) 20 MG capsule    Sig: Take 1 capsule (20 mg total) by mouth daily.    Dispense:  30 capsule    Refill:  2  . oxyCODONE ER (XTAMPZA ER) 13.5 MG  C12A    Sig: Take 13.5 mg by mouth every 12 (twelve) hours.    Dispense:  60 capsule    Refill:  0    Please do not refill this medication prior to 10/06/2018. Thank you.    Order Specific Question:   Supervising Provider    Answer:   Tresa Garter [id number]  . Oxycodone HCl 10 MG TABS    Sig: Take 2 tablets (total dose = 20 mg), every 4 hours as needed for pain.    Dispense:  180 tablet    Refill:  0    Please DO NOT refill this medication prior to 11/03/2018. Thank you.    Order Specific Question:   Supervising Provider    Answer:   Tresa Garter [[id number]]    Follow-up: No follow-ups on file.    Azzie Glatter, FNP
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
## 2018-10-04 NOTE — Telephone Encounter (Signed)
Informed pt , pt stated that she p/u meds.

## 2018-10-04 NOTE — Telephone Encounter (Signed)
Tried to call pt her phone wasn't working.  

## 2018-10-08 ENCOUNTER — Other Ambulatory Visit: Payer: Self-pay

## 2018-10-08 ENCOUNTER — Telehealth: Payer: Self-pay

## 2018-10-08 ENCOUNTER — Other Ambulatory Visit: Payer: Self-pay | Admitting: Family Medicine

## 2018-10-08 DIAGNOSIS — D5700 Hb-SS disease with crisis, unspecified: Secondary | ICD-10-CM

## 2018-10-08 DIAGNOSIS — R110 Nausea: Secondary | ICD-10-CM

## 2018-10-08 DIAGNOSIS — G894 Chronic pain syndrome: Secondary | ICD-10-CM

## 2018-10-08 DIAGNOSIS — Z79899 Other long term (current) drug therapy: Secondary | ICD-10-CM

## 2018-10-08 MED ORDER — ONDANSETRON HCL 4 MG PO TABS: 4.0000 mg | ORAL_TABLET | Freq: Three times a day (TID) | ORAL | 2 refills | Status: DC | PRN

## 2018-10-08 NOTE — Telephone Encounter (Signed)
Called and l/m for pt call us back.  

## 2018-10-08 NOTE — Telephone Encounter (Signed)
Sent refill into pharmacy

## 2018-10-13 ENCOUNTER — Telehealth: Payer: Self-pay

## 2018-10-13 ENCOUNTER — Other Ambulatory Visit: Payer: Self-pay | Admitting: Family Medicine

## 2018-10-13 DIAGNOSIS — D571 Sickle-cell disease without crisis: Secondary | ICD-10-CM

## 2018-10-13 DIAGNOSIS — G894 Chronic pain syndrome: Secondary | ICD-10-CM

## 2018-10-13 DIAGNOSIS — F1190 Opioid use, unspecified, uncomplicated: Secondary | ICD-10-CM

## 2018-10-13 MED ORDER — OXYCODONE HCL 10 MG PO TABS: ORAL_TABLET | ORAL | 0 refills | Status: DC

## 2018-10-14 ENCOUNTER — Ambulatory Visit: Payer: Medicaid Other | Admitting: Clinical

## 2018-10-15 NOTE — Telephone Encounter (Signed)
Called and l/v informing.

## 2018-10-21 ENCOUNTER — Ambulatory Visit (INDEPENDENT_AMBULATORY_CARE_PROVIDER_SITE_OTHER): Payer: Medicaid Other | Admitting: Clinical

## 2018-10-21 ENCOUNTER — Other Ambulatory Visit: Payer: Self-pay

## 2018-10-21 DIAGNOSIS — F419 Anxiety disorder, unspecified: Secondary | ICD-10-CM

## 2018-10-21 NOTE — BH Specialist Note (Signed)
Session Start time: 10:30   End Time: 11:30 Total Time: 60 Type of Service: [place] Interpreter: No.   Interpreter Name & Language: none   SUBJECTIVE: Stephanie Burch is a 38 y.o. female brought in by patient.  Pt./Family was referred by NP for:  anxiety, depression and grief, stress that leads to sickle cell pain. Pt./Family reports the following symptoms/concerns: grief over loss of her brother, stress from financial difficulties, depression, anxiety Duration of problem: approx. 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 Close relationship with her children and enjoys spending time with them. Has some positive coping skills such as using mindfulness and meditation. Motivated to engage in therapy.  GOALS ADDRESSED: Patient will: 1. Reduce symptoms of: anxiety, stress and depression  2.  Increase knowledge and/or ability of: coping skills, self-management skills and stress reduction  3.  Demonstrate ability to: Increase healthy adjustment to current life circumstances and increase motivation to engage in valued activities  INTERVENTIONS: Interventions utilized:  Behavioral Activation and Supportive Counseling Standardized Assessments completed: Not Needed  Patient continues to experience feelings of sadness and lack of motivation  on some days. Is now also stressed about finances, as her partner was helping with rent and bills and they have recently broken up. Referred patient to [facility] for possible assistance with bills. Will also attempt to identify other resources in [facility]. Supportive counseling and reflection regarding patient's break up with partner. Discussion about values in relationships.   Patient continues to have challenges with motivation due to feelings of depression and anxiety. Reports using healthy coping strategies on some days, such as listening to music or watching inspirational videos; other days are difficult and patient stays in bed. Reinforced healthy coping. Behavioral activation introduction. Practiced identifying positive activities. Assigned homework using behavioral activation worksheets - identifying positive activities and planning out a week. Reviewed strategy for accountability within behavioral activation. Patient reported high likelihood of using the strategy before next session.    PLAN: 1. F/U with behavioral health clinician: 11/04/18 in clinic 2. Behavioral recommendations: behavioral activation activity planning worksheets 3. Referral: [facility] 4. From scale of 1-10, how likely are you to follow plan: very likely  Estanislado Emms, [place] [phone]

## 2018-10-22 ENCOUNTER — Telehealth: Payer: Self-pay | Admitting: Family Medicine

## 2018-10-22 ENCOUNTER — Other Ambulatory Visit: Payer: Self-pay | Admitting: Family Medicine

## 2018-10-22 DIAGNOSIS — R110 Nausea: Secondary | ICD-10-CM

## 2018-10-22 MED ORDER — PROCHLORPERAZINE MALEATE 10 MG PO TABS: 10.0000 mg | ORAL_TABLET | Freq: Four times a day (QID) | ORAL | 3 refills | Status: DC | PRN

## 2018-10-22 NOTE — Telephone Encounter (Signed)
Sent to nurse

## 2018-10-23 ENCOUNTER — Other Ambulatory Visit: Payer: Self-pay | Admitting: Family Medicine

## 2018-10-23 DIAGNOSIS — F419 Anxiety disorder, unspecified: Secondary | ICD-10-CM

## 2018-10-26 ENCOUNTER — Ambulatory Visit: Payer: Medicaid Other | Admitting: Family Medicine

## 2018-10-27 ENCOUNTER — Telehealth: Payer: Self-pay | Admitting: Family Medicine

## 2018-10-27 NOTE — Telephone Encounter (Signed)
Pt is requesting a refill , please advise. 

## 2018-10-28 ENCOUNTER — Other Ambulatory Visit: Payer: Self-pay | Admitting: Family Medicine

## 2018-10-28 DIAGNOSIS — F1190 Opioid use, unspecified, uncomplicated: Secondary | ICD-10-CM

## 2018-10-28 DIAGNOSIS — G894 Chronic pain syndrome: Secondary | ICD-10-CM

## 2018-10-28 DIAGNOSIS — D571 Sickle-cell disease without crisis: Secondary | ICD-10-CM

## 2018-10-28 MED ORDER — OXYCODONE HCL 10 MG PO TABS: ORAL_TABLET | ORAL | 0 refills | Status: DC

## 2018-10-28 NOTE — Telephone Encounter (Signed)
Called and l/m voicemail to inform pt of this

## 2018-10-30 ENCOUNTER — Other Ambulatory Visit: Payer: Self-pay | Admitting: Family Medicine

## 2018-10-30 DIAGNOSIS — D5700 Hb-SS disease with crisis, unspecified: Secondary | ICD-10-CM

## 2018-10-30 DIAGNOSIS — G894 Chronic pain syndrome: Secondary | ICD-10-CM

## 2018-10-30 DIAGNOSIS — Z79899 Other long term (current) drug therapy: Secondary | ICD-10-CM

## 2018-11-04 ENCOUNTER — Ambulatory Visit (INDEPENDENT_AMBULATORY_CARE_PROVIDER_SITE_OTHER): Payer: Medicaid Other | Admitting: Clinical

## 2018-11-04 ENCOUNTER — Other Ambulatory Visit: Payer: Self-pay

## 2018-11-04 DIAGNOSIS — F419 Anxiety disorder, unspecified: Secondary | ICD-10-CM | POA: Diagnosis not present

## 2018-11-04 NOTE — BH Specialist Note (Signed)
Integrated Behavioral Health Note  Session Start time: 10:50   End Time: 11:50 Total Time: 60 Type of Service: Behavioral Health - Individual/Family Interpreter: No.   Interpreter Name & Language: none   SUBJECTIVE: Stephanie Burch is a 38 y.o. female brought in by patient.  Pt./Family was referred by NP for:  anxiety, depression and grief, stress that leads to sickle cell pain. Pt./Family reports the following symptoms/concerns: stress from financial difficulties, depression, anxiety Duration of problem: approx. 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 Close relationship with her children and enjoys spending time with them. Has some positive coping skills such as using mindfulness and meditation. Motivated to engage in therapy.  GOALS ADDRESSED: Patient will: 1. Reduce symptoms of: anxiety, depression and stress  2.  Increase knowledge and/or ability of: coping skills, self-management skills and stress reduction  3.  Demonstrate ability to: Increase healthy adjustment to current life circumstances and increase motivation to engage in valued activities  INTERVENTIONS: Interventions utilized:  Behavioral Activation and Supportive Counseling Standardized Assessments completed: Not Needed   Reviewed progress with behavioral activation activities. Patient reported progress in  having utilized the strategy; she write down several activities in the past two weeks and engaged in those activities despite feeling chronic sickle cell pain and feeling down. Reported improvement in mood after engaging in activities.  Supportive counseling, non-directive reflection regarding patient's break up with partner, and relationship with children. Patient exhibiting progress in healthy adjustment to circumstances and self management skills. Patient talked about a new stressful experience that she responded to with a positive outlook.  Recommend continued IBH counseling to continue work on adjusting to current circumstances and managing pain and mood symptoms.   PLAN: 1. F/U with behavioral health clinician: 11/15/18 in clinic 2. Behavioral recommendations: continued behavioral activation activities 3. Referral: none 4. From scale of 1-10, how likely are you to follow plan:  Estanislado Emms, [facility] [phone]

## 2018-11-05 ENCOUNTER — Other Ambulatory Visit: Payer: Self-pay | Admitting: Family Medicine

## 2018-11-05 ENCOUNTER — Telehealth: Payer: Self-pay | Admitting: Family Medicine

## 2018-11-05 DIAGNOSIS — D571 Sickle-cell disease without crisis: Secondary | ICD-10-CM

## 2018-11-05 DIAGNOSIS — F1190 Opioid use, unspecified, uncomplicated: Secondary | ICD-10-CM

## 2018-11-05 DIAGNOSIS — G894 Chronic pain syndrome: Secondary | ICD-10-CM

## 2018-11-06 ENCOUNTER — Other Ambulatory Visit: Payer: Self-pay | Admitting: Family Medicine

## 2018-11-06 DIAGNOSIS — F1190 Opioid use, unspecified, uncomplicated: Secondary | ICD-10-CM

## 2018-11-06 DIAGNOSIS — G894 Chronic pain syndrome: Secondary | ICD-10-CM

## 2018-11-06 DIAGNOSIS — D571 Sickle-cell disease without crisis: Secondary | ICD-10-CM

## 2018-11-06 MED ORDER — XTAMPZA ER 13.5 MG PO C12A: 13.5000 mg | EXTENDED_RELEASE_CAPSULE | Freq: Two times a day (BID) | ORAL | 0 refills | Status: DC

## 2018-11-07 ENCOUNTER — Inpatient Hospital Stay (HOSPITAL_COMMUNITY)
Admission: EM | Admit: 2018-11-07 | Discharge: 2018-11-11 | DRG: 812 | Disposition: A | Discharge status: Home / Self Care | Payer: Medicaid Other | Attending: Internal Medicine | Admitting: Internal Medicine

## 2018-11-07 ENCOUNTER — Other Ambulatory Visit: Payer: Self-pay

## 2018-11-07 DIAGNOSIS — Z20828 Contact with and (suspected) exposure to other viral communicable diseases: Secondary | ICD-10-CM | POA: Diagnosis present

## 2018-11-07 DIAGNOSIS — Z6841 Body Mass Index (BMI) 40.0 and over, adult: Secondary | ICD-10-CM | POA: Diagnosis present

## 2018-11-07 DIAGNOSIS — G894 Chronic pain syndrome: Secondary | ICD-10-CM | POA: Diagnosis not present

## 2018-11-07 DIAGNOSIS — Z8249 Family history of ischemic heart disease and other diseases of the circulatory system: Secondary | ICD-10-CM | POA: Diagnosis not present

## 2018-11-07 DIAGNOSIS — Z03818 Encounter for observation for suspected exposure to other biological agents ruled out: Secondary | ICD-10-CM | POA: Diagnosis not present

## 2018-11-07 DIAGNOSIS — Z832 Family history of diseases of the blood and blood-forming organs and certain disorders involving the immune mechanism: Secondary | ICD-10-CM

## 2018-11-07 DIAGNOSIS — R7981 Abnormal blood-gas level: Secondary | ICD-10-CM | POA: Diagnosis not present

## 2018-11-07 DIAGNOSIS — J811 Chronic pulmonary edema: Secondary | ICD-10-CM | POA: Diagnosis not present

## 2018-11-07 DIAGNOSIS — D5700 Hb-SS disease with crisis, unspecified: Secondary | ICD-10-CM | POA: Diagnosis present

## 2018-11-07 DIAGNOSIS — Z833 Family history of diabetes mellitus: Secondary | ICD-10-CM | POA: Diagnosis not present

## 2018-11-07 DIAGNOSIS — D57219 Sickle-cell/Hb-C disease with crisis, unspecified: Principal | ICD-10-CM | POA: Diagnosis present

## 2018-11-07 LAB — RETICULOCYTES
Immature Retic Fract: 40.2 % — ABNORMAL HIGH (ref 2.3–15.9)
RBC.: 4.41 MIL/uL (ref 3.87–5.11)
Retic Count, Absolute: 169.8 10*3/uL (ref 19.0–186.0)
Retic Ct Pct: 3.9 % — ABNORMAL HIGH (ref 0.4–3.1)

## 2018-11-07 LAB — CBC WITH DIFFERENTIAL/PLATELET
Abs Immature Granulocytes: 0.17 10*3/uL — ABNORMAL HIGH (ref 0.00–0.07)
Basophils Absolute: 0.1 10*3/uL (ref 0.0–0.1)
Basophils Relative: 1 %
Eosinophils Absolute: 0.1 10*3/uL (ref 0.0–0.5)
Eosinophils Relative: 1 %
HCT: 31.7 % — ABNORMAL LOW (ref 36.0–46.0)
Hemoglobin: 10.9 g/dL — ABNORMAL LOW (ref 12.0–15.0)
Immature Granulocytes: 2 %
Lymphocytes Relative: 20 %
Lymphs Abs: 1.9 10*3/uL (ref 0.7–4.0)
MCH: 24.7 pg — ABNORMAL LOW (ref 26.0–34.0)
MCHC: 34.4 g/dL (ref 30.0–36.0)
MCV: 71.9 fL — ABNORMAL LOW (ref 80.0–100.0)
Monocytes Absolute: 0.7 10*3/uL (ref 0.1–1.0)
Monocytes Relative: 7 %
Neutro Abs: 6.5 10*3/uL (ref 1.7–7.7)
Neutrophils Relative %: 69 %
Platelets: 183 10*3/uL (ref 150–400)
RBC: 4.41 MIL/uL (ref 3.87–5.11)
RDW: 14.8 % (ref 11.5–15.5)
WBC: 9.3 10*3/uL (ref 4.0–10.5)
nRBC: 1.7 % — ABNORMAL HIGH (ref 0.0–0.2)

## 2018-11-07 LAB — I-STAT BETA HCG BLOOD, ED (MC, WL, AP ONLY): I-stat hCG, quantitative: <5 m[IU]/mL

## 2018-11-07 LAB — COMPREHENSIVE METABOLIC PANEL
ALT: 16 U/L (ref 0–44)
AST: 14 U/L — ABNORMAL LOW (ref 15–41)
Albumin: 3.9 g/dL (ref 3.5–5.0)
Alkaline Phosphatase: 82 U/L (ref 38–126)
Anion gap: 7 (ref 5–15)
BUN: 8 mg/dL (ref 6–20)
CO2: 25 mmol/L (ref 22–32)
Calcium: 9 mg/dL (ref 8.9–10.3)
Chloride: 108 mmol/L (ref 98–111)
Creatinine, Ser: 0.71 mg/dL (ref 0.44–1.00)
GFR calc Af Amer: >60 mL/min (ref >60)
GFR calc non Af Amer: >60 mL/min (ref >60)
Glucose, Bld: 102 mg/dL — ABNORMAL HIGH (ref 70–99)
Potassium: 4 mmol/L (ref 3.5–5.1)
Sodium: 140 mmol/L (ref 135–145)
Total Bilirubin: 1.6 mg/dL — ABNORMAL HIGH (ref 0.3–1.2)
Total Protein: 7 g/dL (ref 6.5–8.1)

## 2018-11-07 LAB — SARS CORONAVIRUS 2 BY RT PCR (HOSPITAL ORDER, PERFORMED IN ~~LOC~~ HOSPITAL LAB): SARS Coronavirus 2: NEGATIVE

## 2018-11-07 MED ORDER — SODIUM CHLORIDE 0.45 % IV SOLN
INTRAVENOUS | Status: DC
  Administered 2018-11-07: 12:00:00 via INTRAVENOUS

## 2018-11-07 MED ORDER — SODIUM CHLORIDE 0.9% FLUSH: 9.0000 mL | INTRAVENOUS | Status: DC | PRN

## 2018-11-07 MED ORDER — DULOXETINE HCL 20 MG PO CPEP
20.0000 mg | ORAL_CAPSULE | Freq: Every day | ORAL | Status: DC
  Administered 2018-11-07 – 2018-11-11 (×5): 20 mg via ORAL
  Filled 2018-11-07 (×5): qty 1

## 2018-11-07 MED ORDER — HYDROMORPHONE HCL 2 MG/ML IJ SOLN: 2.0000 mg | INTRAMUSCULAR | Status: AC

## 2018-11-07 MED ORDER — SODIUM CHLORIDE 0.9 % IV SOLN
25.0000 mg | INTRAVENOUS | Status: DC | PRN
  Filled 2018-11-07: qty 0.5

## 2018-11-07 MED ORDER — SENNOSIDES-DOCUSATE SODIUM 8.6-50 MG PO TABS
1.0000 | ORAL_TABLET | Freq: Two times a day (BID) | ORAL | Status: DC
  Administered 2018-11-08 – 2018-11-10 (×4): 1 via ORAL
  Filled 2018-11-07 (×7): qty 1

## 2018-11-07 MED ORDER — ENOXAPARIN SODIUM 40 MG/0.4ML ~~LOC~~ SOLN
40.0000 mg | SUBCUTANEOUS | Status: DC
  Filled 2018-11-07 (×2): qty 0.4

## 2018-11-07 MED ORDER — ONDANSETRON HCL 4 MG/2ML IJ SOLN
4.0000 mg | Freq: Four times a day (QID) | INTRAMUSCULAR | Status: DC | PRN
  Administered 2018-11-08 – 2018-11-09 (×3): 4 mg via INTRAVENOUS
  Filled 2018-11-07 (×3): qty 2

## 2018-11-07 MED ORDER — SODIUM CHLORIDE 0.9% FLUSH: 3.0000 mL | Freq: Once | INTRAVENOUS | Status: DC

## 2018-11-07 MED ORDER — HYDROXYUREA 500 MG PO CAPS
2000.0000 mg | ORAL_CAPSULE | Freq: Every day | ORAL | Status: DC
  Administered 2018-11-08 – 2018-11-11 (×3): 2000 mg via ORAL
  Filled 2018-11-07 (×4): qty 4

## 2018-11-07 MED ORDER — HYDROMORPHONE HCL 2 MG/ML IJ SOLN
2.0000 mg | INTRAMUSCULAR | Status: AC
  Filled 2018-11-07: qty 1

## 2018-11-07 MED ORDER — KETOROLAC TROMETHAMINE 15 MG/ML IJ SOLN
15.0000 mg | Freq: Four times a day (QID) | INTRAMUSCULAR | Status: DC
  Administered 2018-11-07 – 2018-11-10 (×12): 15 mg via INTRAVENOUS
  Filled 2018-11-07 (×13): qty 1

## 2018-11-07 MED ORDER — TIZANIDINE HCL 4 MG PO TABS
4.0000 mg | ORAL_TABLET | Freq: Three times a day (TID) | ORAL | Status: DC
  Administered 2018-11-07 – 2018-11-11 (×10): 4 mg via ORAL
  Filled 2018-11-07 (×10): qty 1

## 2018-11-07 MED ORDER — ONDANSETRON HCL 4 MG/2ML IJ SOLN: 4.0000 mg | INTRAMUSCULAR | Status: DC | PRN

## 2018-11-07 MED ORDER — DIPHENHYDRAMINE HCL 25 MG PO CAPS: 25.0000 mg | ORAL_CAPSULE | ORAL | Status: DC | PRN

## 2018-11-07 MED ORDER — NALOXONE HCL 0.4 MG/ML IJ SOLN: 0.4000 mg | INTRAMUSCULAR | Status: DC | PRN

## 2018-11-07 MED ORDER — TIZANIDINE HCL 4 MG PO CAPS: 4.0000 mg | ORAL_CAPSULE | Freq: Three times a day (TID) | ORAL | Status: DC

## 2018-11-07 MED ORDER — DIPHENHYDRAMINE HCL 50 MG/ML IJ SOLN
25.0000 mg | Freq: Once | INTRAMUSCULAR | Status: AC
  Administered 2018-11-07: 25 mg via INTRAVENOUS
  Filled 2018-11-07: qty 1

## 2018-11-07 MED ORDER — HYDROMORPHONE HCL 2 MG/ML IJ SOLN
2.0000 mg | INTRAMUSCULAR | Status: AC
  Administered 2018-11-07 (×2): 2 mg via INTRAVENOUS
  Filled 2018-11-07: qty 1

## 2018-11-07 MED ORDER — KETOROLAC TROMETHAMINE 30 MG/ML IJ SOLN
30.0000 mg | INTRAMUSCULAR | Status: AC
  Administered 2018-11-07: 30 mg via INTRAVENOUS
  Filled 2018-11-07: qty 1

## 2018-11-07 MED ORDER — OXYCODONE HCL ER 15 MG PO T12A
15.0000 mg | EXTENDED_RELEASE_TABLET | Freq: Two times a day (BID) | ORAL | Status: DC
  Administered 2018-11-07 – 2018-11-11 (×8): 15 mg via ORAL
  Filled 2018-11-07 (×8): qty 1

## 2018-11-07 MED ORDER — VOXELOTOR 500 MG PO TABS
1500.0000 mg | ORAL_TABLET | Freq: Every day | ORAL | Status: DC
  Administered 2018-11-10: 1500 mg via ORAL

## 2018-11-07 MED ORDER — HYDROMORPHONE HCL 2 MG/ML IJ SOLN
2.0000 mg | INTRAMUSCULAR | Status: AC
  Administered 2018-11-07: 2 mg via INTRAVENOUS
  Filled 2018-11-07: qty 1

## 2018-11-07 MED ORDER — POLYETHYLENE GLYCOL 3350 17 G PO PACK: 17.0000 g | PACK | Freq: Every day | ORAL | Status: DC | PRN

## 2018-11-07 MED ORDER — HYDROMORPHONE 1 MG/ML IV SOLN
INTRAVENOUS | Status: DC
  Administered 2018-11-07: 30 mg via INTRAVENOUS
  Administered 2018-11-08: 8 mg via INTRAVENOUS
  Administered 2018-11-08: 4.5 mg via INTRAVENOUS
  Administered 2018-11-08: 10.5 mg via INTRAVENOUS
  Administered 2018-11-08: 30 mg via INTRAVENOUS
  Filled 2018-11-07 (×2): qty 30

## 2018-11-07 MED ORDER — FOLIC ACID 1 MG PO TABS
1.0000 mg | ORAL_TABLET | Freq: Every day | ORAL | Status: DC
  Administered 2018-11-07 – 2018-11-11 (×5): 1 mg via ORAL
  Filled 2018-11-07 (×5): qty 1

## 2018-11-07 MED ORDER — SODIUM CHLORIDE 0.45 % IV SOLN
INTRAVENOUS | Status: DC
  Administered 2018-11-07 – 2018-11-10 (×6): via INTRAVENOUS

## 2018-11-07 MED ORDER — OXYCODONE ER 13.5 MG PO C12A: 13.5000 mg | EXTENDED_RELEASE_CAPSULE | Freq: Two times a day (BID) | ORAL | Status: DC

## 2018-11-07 NOTE — ED Provider Notes (Signed)
[facility] DEPT Provider Note   CSN: [id number] Arrival date & time: 11/07/18  1035     History   Chief Complaint Chief Complaint  Patient presents with  . Sickle Cell Pain Crisis    HPI Stephanie Burch is a 38 y.o. female w PMHx sickle cell anemia, presenting to the ED with complaint of right leg pain x1 week, worsening today. Pain is described as aching localized to her thigh. Her symptoms feel similar to prior sickle cell crisis. Denies assoc F, CP, SOB, abd pain, N/V/D/C, urinary sx. She has run out of her home ER oxycodone 2 days ago and has been treating her pain with her IR oxycodone and OTC medications.      The history is provided by the patient.    Past Medical History:  Diagnosis Date  . Sickle cell anemia [facility])     Patient Active Problem List   Diagnosis Date Noted  . Hypokalemia 09/20/2018  . Medication management 09/14/2018  . Muscle spasms of both lower extremities 08/25/2018  . Subjective visual disturbance of right eye 08/25/2018  . 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 Chronic pain syndrome 03/16/2018  . Chronic, continuous use of opioids 03/16/2018  . Tachycardia with heart rate 100-120 beats per minute   . Sickle cell anemia with crisis ([place]) 10/19/2017  . Leukocytosis 10/19/2017  . Thrombocytopenia ([place]) 10/19/2017  . Morbidly obese ([place]) 10/19/2017  . Sickle cell pain crisis ([place]) 10/15/2017    No past surgical history on file.   OB History   No obstetric history on file.      Home Medications    Prior to Admission medications   Medication Sig Start Date End Date Taking? Authorizing Provider  DULoxetine (CYMBALTA) 20 MG capsule TAKE 1 CAPSULE BY MOUTH EVERY DAY 10/25/18  Yes Azzie Glatter, FNP  folic acid (FOLVITE) 1 MG tablet Take 1 tablet (1 mg total) by mouth daily. 07/14/18  Yes Azzie Glatter, FNP  ibuprofen (ADVIL) 800 MG tablet Take 1 tablet (800 mg total) by mouth every 8 (eight)  hours as needed for headache, mild pain, moderate pain or cramping. 09/17/18  Yes Azzie Glatter, FNP  naloxone [facility]) 0.4 MG/ML injection As needed 09/14/18  Yes Azzie Glatter, FNP  ondansetron (ZOFRAN) 4 MG tablet Take 1 tablet (4 mg total) by mouth every 8 (eight) hours as needed for nausea or vomiting. 10/08/18  Yes Azzie Glatter, FNP  oxyCODONE ER [facility] ER) 13.5 MG C12A Take 13.5 mg by mouth every 12 (twelve) hours. 11/06/18 12/06/18 Yes Azzie Glatter, FNP  Oxycodone HCl 10 MG TABS Take 2 tablets (total dose = 20 mg), every 4 hours as needed for pain. 10/31/18  Yes Azzie Glatter, FNP  prochlorperazine (COMPAZINE) 10 MG tablet Take 1 tablet (10 mg total) by mouth every 6 (six) hours as needed for nausea or vomiting. Take if Ondansetron is not effective. 10/22/18  Yes Azzie Glatter, FNP  tiZANidine (ZANAFLEX) 4 MG capsule TAKE 1 CAPSULE (4 MG TOTAL) BY MOUTH 3 (THREE) TIMES DAILY. 11/01/18  Yes Azzie Glatter, FNP  Vitamin D, Ergocalciferol, (DRISDOL) 50000 units CAPS capsule Take 1 capsule (50,000 Units total) by mouth every 7 (seven) days. 12/02/17  Yes Azzie Glatter, FNP  voxelotor (OXBRYTA) 500 MG TABS tablet Take 1,500 mg by mouth daily. For Sickle Cell Anemia   Yes [provider]  docusate sodium (COLACE) 100 MG capsule Take 1 capsule (100  mg total) by mouth 2 (two) times daily. Patient not taking: Reported on 09/20/2018 09/10/18   Kallie LocksStroud, [name], FNP  hydroxyurea (HYDREA) 500 MG capsule Take 4 capsules (2,000 mg total) by mouth daily. May take with food to minimize GI side effects. Patient not taking: Reported on 10/01/2018 09/17/18   Kallie LocksStroud, [name], FNP    Family History Family History  Problem Relation Age of Onset  . Hypertension Mother   . Sickle cell trait Mother   . Diabetes Father   . Sickle cell trait Father     Social History Social History   Tobacco Use  . Smoking status: Never Smoker  . Smokeless tobacco: Never Used  Substance  Use Topics  . Alcohol use: Never    Frequency: Never  . Drug use: Never     Allergies   Patient has no known allergies.   Review of Systems Review of Systems  Constitutional: Negative for fever.  Respiratory: Negative for cough and shortness of breath.   Cardiovascular: Negative for chest pain.  Gastrointestinal: Negative for abdominal pain, nausea and vomiting.  Genitourinary: Negative for dysuria.  Musculoskeletal: Positive for arthralgias and myalgias.  All other systems reviewed and are negative.    Physical Exam Updated Vital Signs BP (!) 155/87   Pulse 70   Temp 98.8 F (37.1 C) (Oral)   Resp 13   Ht 5\' 5"  (1.651 m)   Wt 117.9 kg   LMP 11/07/2018 (Exact Date) Comment: currently on period  SpO2 100%   BMI 43.27 kg/m   Physical Exam Vitals signs and nursing note reviewed.  Constitutional:      Appearance: She is well-developed. She is not ill-appearing.  HENT:     Head: Normocephalic and atraumatic.  Eyes:     Conjunctiva/sclera: Conjunctivae normal.  Cardiovascular:     Rate and Rhythm: Normal rate and regular rhythm.  Pulmonary:     Effort: Pulmonary effort is normal. No respiratory distress.     Breath sounds: Normal breath sounds.  Abdominal:     General: Bowel sounds are normal.     Palpations: Abdomen is soft.     Tenderness: There is no abdominal tenderness. There is no guarding or rebound.  Musculoskeletal:     Comments: No LE edema or redness. Generalized TTP to RLE. Intact PT pulses b/l  Skin:    General: Skin is warm.  Neurological:     Mental Status: She is alert.  Psychiatric:        Behavior: Behavior normal.      ED Treatments / Results  Labs (all labs ordered are listed, but only abnormal results are displayed) Labs Reviewed  COMPREHENSIVE METABOLIC PANEL - Abnormal; Notable for the following components:      Result Value   Glucose, Bld 102 (*)    AST 14 (*)    Total Bilirubin 1.6 (*)    All other components within normal  limits  CBC WITH DIFFERENTIAL/PLATELET - Abnormal; Notable for the following components:   Hemoglobin 10.9 (*)    HCT 31.7 (*)    MCV 71.9 (*)    MCH 24.7 (*)    nRBC 1.7 (*)    Abs Immature Granulocytes 0.17 (*)    All other components within normal limits  RETICULOCYTES - Abnormal; Notable for the following components:   Retic Ct Pct 3.9 (*)    Immature Retic Fract 40.2 (*)    All other components within normal limits  SARS CORONAVIRUS 2 (HOSPITAL ORDER,  PERFORMED IN [facility])  I-STAT BETA HCG BLOOD, ED ([facility], [facility], [facility] ONLY)    EKG None  Radiology No results found.  Procedures Procedures (including critical care time)  Medications Ordered in ED Medications  sodium chloride flush (NS) 0.9 % injection 3 mL (has no administration in time range)  HYDROmorphone (DILAUDID) injection 2 mg (2 mg Intravenous Given 11/07/18 1506)    Or  HYDROmorphone (DILAUDID) injection 2 mg ( Subcutaneous See Alternative 11/07/18 1506)  0.45 % sodium chloride infusion ( Intravenous New Bag/Given 11/07/18 1223)  ondansetron (ZOFRAN) injection 4 mg (has no administration in time range)  HYDROmorphone (DILAUDID) injection 2 mg (2 mg Intravenous Given 11/07/18 1219)    Or  HYDROmorphone (DILAUDID) injection 2 mg ( Subcutaneous See Alternative 11/07/18 1219)  HYDROmorphone (DILAUDID) injection 2 mg (2 mg Intravenous Given 11/07/18 1312)    Or  HYDROmorphone (DILAUDID) injection 2 mg ( Subcutaneous See Alternative 11/07/18 1312)  HYDROmorphone (DILAUDID) injection 2 mg (2 mg Intravenous Given 11/07/18 1358)    Or  HYDROmorphone (DILAUDID) injection 2 mg ( Subcutaneous See Alternative 11/07/18 1358)  ketorolac (TORADOL) 30 MG/ML injection 30 mg (30 mg Intravenous Given 11/07/18 1223)  diphenhydrAMINE (BENADRYL) injection 25 mg (25 mg Intravenous Given 11/07/18 1224)     Initial Impression / Assessment and Plan / ED Course  I have reviewed the triage vital signs and the nursing notes.  Pertinent  labs & imaging results that were available during my care of the patient were reviewed by me and considered in my medical decision making (see chart for details).  Clinical Course as of Nov 06 1532  Wynelle Link Nov 07, 2018  1352 Pt re-evaluated. Discussed reassuring labs. She states pain is improved though is not confident about discharge. Will re-evaluate after additional medications.   [JR]  1456 Patient reevaluated, reports minimal improvement in symptoms.  We will proceed with admission for pain control.   [JR]    Clinical Course User Index [JR] Lelynd Poer, [place] N, PA-C       Patient presenting with sickle cell crisis. Patient with typical symptoms and pain. Pt without CP, abdominal pain, or SOB. Pt is not exhibiting signs or symptoms of acute chest syndrome, organ failure,  or DVT. Pt is afebrile, hemodynamically stable. WBC wnl. Hgb without change from baseline. Patient treated with sickle cell protocol, however without significant improvement in symptoms. Sickle cell clinic consulted for admission for further management.    Final Clinical Impressions(s) / ED Diagnoses   Final diagnoses:  Sickle cell pain crisis [facility])    ED Discharge Orders    None       Sarie Stall, [place] N, PA-C 11/07/18 1534    Cathren Laine, MD 11/08/18 [phone]

## 2018-11-07 NOTE — H&P (Signed)
H&P  Patient Demographics:  Stephanie Burch, is a 38 y.o. female  MRN: [id number]   DOB - Sep 11, 1980  Admit Date - 11/07/2018  Outpatient Primary MD for the patient is Azzie Glatter, FNP  Chief Complaint  Patient presents with  . Sickle Cell Pain Crisis      HPI:   Stephanie Burch  is a 38 y.o. female with a medical history significant for sickle cell anemia, hemoglobin [place], chronic pain syndrome, morbid obesity, opiate tolerance and dependence who came to the emergency room today with major complaints of pain mostly in her bilateral lower extremities that is consistent with her typical sickle cell pain crisis.  Patient claims this pain has slowly increased over the last several days and is no longer responding to her home medications.  Patient states that she had taken a little more than her usual home medications with no sustained relief.  Her current pain intensity is 10/10, characterized as constant and throbbing.  She denies any fever, shortness of breath, headache, dizziness, dysuria, nausea, vomiting or diarrhea.  She denies any contact or exposure to COVID-19 patients.  ED course: Patient was found to be afebrile, saturating well on room air with vital signs were largely within normal limits.  Her immediate laboratory test results were unremarkable including her CBC and CMP.  Her hemoglobin was 10.9 which is at baseline, Yau cell count was normal, her comprehensive metabolic panel were largely within normal limit.  Awaiting coronavirus screening result.  Patient was given multiple doses of Dilaudid IV per ED protocol, there was no significant improvement in her pain, patient will be admitted for sickle cell inpatient service for sickle cell pain management and further evaluation.   Review of systems:  In addition to the HPI above, patient reports No fever or chills No Headache, No changes with vision or hearing No problems swallowing food or liquids No chest pain, cough or shortness of  breath No Abdominal pain, No Nausea or Vomiting, Bowel movements are regular No blood in stool or urine No dysuria No new skin rashes or bruises No new joints pains-aches No new weakness, tingling, numbness in any extremity No recent weight gain or loss No polyuria, polydypsia or polyphagia No significant Mental Stressors  A full 10 point Review of Systems was done, except as stated above, all other Review of Systems were negative.  With Past History of the following :   Past Medical History:  Diagnosis Date  . Sickle cell anemia (HCC)       No past surgical history on file.   Social History:   Social History   Tobacco Use  . Smoking status: Never Smoker  . Smokeless tobacco: Never Used  Substance Use Topics  . Alcohol use: Never    Frequency: Never     Lives - At home   Family History :   Family History  Problem Relation Age of Onset  . Hypertension Mother   . Sickle cell trait Mother   . Diabetes Father   . Sickle cell trait Father      Home Medications:   Prior to Admission medications   Medication Sig Start Date End Date Taking? Authorizing Provider  DULoxetine (CYMBALTA) 20 MG capsule TAKE 1 CAPSULE BY MOUTH EVERY DAY 10/25/18  Yes Azzie Glatter, FNP  folic acid (FOLVITE) 1 MG tablet Take 1 tablet (1 mg total) by mouth daily. 07/14/18  Yes Azzie Glatter, FNP  ibuprofen (ADVIL) 800 MG tablet Take 1 tablet (  800 mg total) by mouth every 8 (eight) hours as needed for headache, mild pain, moderate pain or cramping. 09/17/18  Yes Kallie LocksStroud, [name], FNP  naloxone [facility](NARCAN) 0.4 MG/ML injection As needed 09/14/18  Yes Kallie LocksStroud, [name], FNP  ondansetron (ZOFRAN) 4 MG tablet Take 1 tablet (4 mg total) by mouth every 8 (eight) hours as needed for nausea or vomiting. 10/08/18  Yes Kallie LocksStroud, [name], FNP  oxyCODONE ER [facility](XTAMPZA ER) 13.5 MG C12A Take 13.5 mg by mouth every 12 (twelve) hours. 11/06/18 12/06/18 Yes Kallie LocksStroud, [name], FNP  Oxycodone HCl 10 MG TABS Take 2  tablets (total dose = 20 mg), every 4 hours as needed for pain. 10/31/18  Yes Kallie LocksStroud, [name], FNP  prochlorperazine (COMPAZINE) 10 MG tablet Take 1 tablet (10 mg total) by mouth every 6 (six) hours as needed for nausea or vomiting. Take if Ondansetron is not effective. 10/22/18  Yes Kallie LocksStroud, [name], FNP  tiZANidine (ZANAFLEX) 4 MG capsule TAKE 1 CAPSULE (4 MG TOTAL) BY MOUTH 3 (THREE) TIMES DAILY. 11/01/18  Yes Kallie LocksStroud, [name], FNP  Vitamin D, Ergocalciferol, (DRISDOL) 50000 units CAPS capsule Take 1 capsule (50,000 Units total) by mouth every 7 (seven) days. 12/02/17  Yes Kallie LocksStroud, [name], FNP  voxelotor (OXBRYTA) 500 MG TABS tablet Take 1,500 mg by mouth daily. For Sickle Cell Anemia   Yes [provider]  docusate sodium (COLACE) 100 MG capsule Take 1 capsule (100 mg total) by mouth 2 (two) times daily. Patient not taking: Reported on 09/20/2018 09/10/18   Kallie LocksStroud, [name], FNP  hydroxyurea (HYDREA) 500 MG capsule Take 4 capsules (2,000 mg total) by mouth daily. May take with food to minimize GI side effects. Patient not taking: Reported on 10/01/2018 09/17/18   Kallie LocksStroud, [name], FNP     Allergies:   No Known Allergies   Physical Exam:   Vitals:   Vitals:   11/07/18 1300 11/07/18 1403  BP: (!) 172/96 (!) 155/87  Pulse: 65 70  Resp: 18 13  Temp:    SpO2: 100% 100%    Physical Exam: Constitutional: Patient appears well-developed and well-nourished. Not in obvious distress. HENT: Normocephalic, atraumatic, External right and left ear normal. Oropharynx is clear and moist.  Eyes: Conjunctivae and EOM are normal. PERRLA, no scleral icterus. Neck: Normal ROM. Neck supple. No JVD. No tracheal deviation. No thyromegaly. CVS: RRR, S1/S2 +, no murmurs, no gallops, no carotid bruit.  Pulmonary: Effort and breath sounds normal, no stridor, rhonchi, wheezes, rales.  Abdominal: Soft. BS +, no distension, tenderness, rebound or guarding.  Musculoskeletal: Normal range of motion. No  edema and no tenderness.  Lymphadenopathy: No lymphadenopathy noted, cervical, inguinal or axillary Neuro: Alert. Normal reflexes, muscle tone coordination. No cranial nerve deficit. Skin: Skin is warm and dry. No rash noted. Not diaphoretic. No erythema. No pallor. Psychiatric: Normal mood and affect. Behavior, judgment, thought content normal.   Data Review:   CBC Recent Labs  Lab 11/07/18 1129  WBC 9.3  HGB 10.9*  HCT 31.7*  PLT 183  MCV 71.9*  MCH 24.7*  MCHC 34.4  RDW 14.8  LYMPHSABS 1.9  MONOABS 0.7  EOSABS 0.1  BASOSABS 0.1   ------------------------------------------------------------------------------------------------------------------  Chemistries  Recent Labs  Lab 11/07/18 1129  NA 140  K 4.0  CL 108  CO2 25  GLUCOSE 102*  BUN 8  CREATININE 0.71  CALCIUM 9.0  AST 14*  ALT 16  ALKPHOS 82  BILITOT 1.6*   ------------------------------------------------------------------------------------------------------------------ estimated creatinine  clearance is 126.1 mL/min (by C-G formula based on SCr of 0.71 mg/dL). ------------------------------------------------------------------------------------------------------------------ No results for input(s): TSH, T4TOTAL, T3FREE, THYROIDAB in the last 72 hours.  Invalid input(s): FREET3  Coagulation profile No results for input(s): INR, PROTIME in the last 168 hours. ------------------------------------------------------------------------------------------------------------------- No results for input(s): DDIMER in the last 72 hours. -------------------------------------------------------------------------------------------------------------------  Cardiac Enzymes No results for input(s): CKMB, TROPONINI, MYOGLOBIN in the last 168 hours.  Invalid input(s): CK ------------------------------------------------------------------------------------------------------------------ No results found for:  BNP  ---------------------------------------------------------------------------------------------------------------  Urinalysis    Component Value Date/Time   COLORURINE YELLOW 02/09/2018 0933   APPEARANCEUR CLEAR 02/09/2018 0933   LABSPEC 1.011 02/09/2018 0933   PHURINE 5.0 02/09/2018 0933   GLUCOSEU NEGATIVE 02/09/2018 0933   HGBUR NEGATIVE 02/09/2018 0933   BILIRUBINUR negative 09/14/2018 0830   KETONESUR negative 09/14/2018 0830   KETONESUR NEGATIVE 02/09/2018 0933   PROTEINUR negative 09/14/2018 0830   PROTEINUR NEGATIVE 02/09/2018 0933   UROBILINOGEN 1.0 09/14/2018 0830   NITRITE Negative 09/14/2018 0830   NITRITE NEGATIVE 02/09/2018 0933   LEUKOCYTESUR Negative 09/14/2018 0830    ----------------------------------------------------------------------------------------------------------------   Imaging Results:    Assessment & Plan:  Active Problems:   Sickle cell anemia with crisis (HCC)   Morbidly obese (HCC)   Chronic pain syndrome   1. Hb Sickle Cell Disease with crisis: Admit, start IVF .45% Saline @ 150 mls/hour, start weight based Dilaudid PCA started within 30 minutes of admission, IV Toradol 15 mg Q 6 H, home long-acting medication, monitor vitals very closely, Re-evaluate pain scale regularly, 2 L of Oxygen by [place], Patient will be re-evaluated for pain in the context of function and relationship to baseline as care progresses. 2. 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  No clinical indication for blood transfusion today. 3. Chronic pain Syndrome: OxyContin p.o. every 12 hours per home dosage 4. Morbid obesity: Patient counseled extensively.  Recommend healthy diet and regular physical exercise upon discharge from the hospital.  DVT Prophylaxis: Subcut Lovenox   AM Labs Ordered, also please review Full Orders  Family Communication: Admission, patient's condition and plan of care including tests being ordered have been discussed with the patient  who indicate understanding and agree with the plan and Code Status.  Code Status: Full Code  Consults called: None    Admission status: Inpatient    Time spent in minutes : 50 minutes  Jeanann Lewandowsky MD, MHA, CPE, FACP 11/07/2018 at 3:39 PM

## 2018-11-07 NOTE — ED Triage Notes (Signed)
Pt to ED from home, reports pain in right leg for 1 week, hx sickle cell.

## 2018-11-08 ENCOUNTER — Other Ambulatory Visit: Payer: Self-pay

## 2018-11-08 ENCOUNTER — Encounter (HOSPITAL_COMMUNITY): Payer: Self-pay | Admitting: *Deleted

## 2018-11-08 LAB — URINALYSIS, ROUTINE W REFLEX MICROSCOPIC
Bacteria, UA: NONE SEEN
Bilirubin Urine: NEGATIVE
Glucose, UA: NEGATIVE mg/dL
Ketones, ur: NEGATIVE mg/dL
Nitrite: NEGATIVE
Protein, ur: 30 mg/dL — AB
Specific Gravity, Urine: 1.013 (ref 1.005–1.030)
pH: 5 (ref 5.0–8.0)

## 2018-11-08 MED ORDER — OXYCODONE HCL 5 MG PO TABS
20.0000 mg | ORAL_TABLET | ORAL | Status: DC | PRN
  Administered 2018-11-08 – 2018-11-10 (×5): 20 mg via ORAL
  Filled 2018-11-08 (×5): qty 4

## 2018-11-08 MED ORDER — HYDROMORPHONE 1 MG/ML IV SOLN
INTRAVENOUS | Status: DC
  Administered 2018-11-08: 4.5 mg via INTRAVENOUS
  Administered 2018-11-08: 0.5 mg via INTRAVENOUS
  Administered 2018-11-09: 4.5 mg via INTRAVENOUS
  Administered 2018-11-09: 1 mg via INTRAVENOUS
  Administered 2018-11-09: 4 mg via INTRAVENOUS
  Administered 2018-11-09: 3 mg via INTRAVENOUS
  Administered 2018-11-09: 30 mg via INTRAVENOUS
  Administered 2018-11-09: 6.5 mg via INTRAVENOUS
  Administered 2018-11-09: 7.5 mg via INTRAVENOUS
  Administered 2018-11-10: 2.5 mg via INTRAVENOUS
  Administered 2018-11-10: 5.5 mg via INTRAVENOUS
  Administered 2018-11-10: 6.5 mg via INTRAVENOUS
  Administered 2018-11-10: 4 mg via INTRAVENOUS
  Filled 2018-11-08: qty 30

## 2018-11-08 NOTE — Progress Notes (Signed)
Subjective: Stephanie Burch, a 38 year old female with a medical history significant for sickle cell disease, type [place], chronic pain syndrome, morbid obesity, opiate tolerance, and opiate dependence was admitted for sickle cell pain crisis. Patient states that pain is improved some overnight.  Pain is mostly to right lower extremity.  Pain intensity 7/10 characterized as constant, throbbing, and occasionally sharp.  Pain is worsened by weightbearing.  She denies headache, chest pain, shortness of breath, dizziness, paresthesias, nausea, vomiting, or diarrhea.  Objective:  Vital signs in last 24 hours:  Vitals:   11/08/18 0603 11/08/18 0759 11/08/18 0818 11/08/18 1144  BP: (!) 142/73 138/82  125/81  Pulse: (!) 104 (!) 102  (!) 106  Resp: 20 17 20 18   Temp: 99.1 F (37.3 C) 98.2 F (36.8 C)  98.4 F (36.9 C)  TempSrc: Oral Oral  Oral  SpO2: 94% 95% 94% 93%  Weight: 122 kg     Height:        Intake/Output from previous day:   Intake/Output Summary (Last 24 hours) at 11/08/2018 1202 Last data filed at 11/08/2018 0900 Gross per 24 hour  Intake 720 ml  Output 750 ml  Net -30 ml    Physical Exam: General: Alert, awake, oriented x3, in no acute distress.  HEENT: [place]/AT PEERL, EOMI Neck: Trachea midline,  no masses, no thyromegal,y no JVD, no carotid bruit OROPHARYNX:  Moist, No exudate/ erythema/lesions.  Heart: Regular rate and rhythm, without murmurs, rubs, gallops, PMI non-displaced, no heaves or thrills on palpation.  Lungs: Clear to auscultation, no wheezing or rhonchi noted. No increased vocal fremitus resonant to percussion  Abdomen: Soft, nontender, nondistended, positive bowel sounds, no masses no hepatosplenomegaly noted..  Neuro: No focal neurological deficits noted cranial nerves II through XII grossly intact. DTRs 2+ bilaterally upper and lower extremities. Strength 5 out of 5 in bilateral upper and lower extremities. Musculoskeletal: No warm swelling or erythema around  joints, no spinal tenderness noted. Psychiatric: Patient alert and oriented x3, good insight and cognition, good recent to remote recall. Lymph node survey: No cervical axillary or inguinal lymphadenopathy noted.  Lab Results:  Basic Metabolic Panel:    Component Value Date/Time   NA 140 11/07/2018 1129   NA 142 09/11/2017 1431   K 4.0 11/07/2018 1129   CL 108 11/07/2018 1129   CO2 25 11/07/2018 1129   BUN 8 11/07/2018 1129   BUN 8 09/11/2017 1431   CREATININE 0.71 11/07/2018 1129   GLUCOSE 102 (H) 11/07/2018 1129   CALCIUM 9.0 11/07/2018 1129   CBC:    Component Value Date/Time   WBC 9.3 11/07/2018 1129   HGB 10.9 (L) 11/07/2018 1129   HGB 13.0 09/11/2017 1431   HCT 31.7 (L) 11/07/2018 1129   HCT 38.4 09/11/2017 1431   PLT 183 11/07/2018 1129   PLT 258 09/11/2017 1431   MCV 71.9 (L) 11/07/2018 1129   MCV 75 (L) 09/11/2017 1431   NEUTROABS 6.5 11/07/2018 1129   NEUTROABS 3.9 09/11/2017 1431   LYMPHSABS 1.9 11/07/2018 1129   LYMPHSABS 2.2 09/11/2017 1431   MONOABS 0.7 11/07/2018 1129   EOSABS 0.1 11/07/2018 1129   EOSABS 0.1 09/11/2017 1431   BASOSABS 0.1 11/07/2018 1129   BASOSABS 0.0 09/11/2017 1431    Recent Results (from the past 240 hour(s))  SARS Coronavirus 2 [facility] order, Performed in [facility] hospital lab) Nasopharyngeal Nasopharyngeal Swab     Status: None   Collection Time: 11/07/18  4:36 PM   Specimen: Nasopharyngeal Swab  Result Value Ref Range Status   SARS Coronavirus 2 NEGATIVE NEGATIVE Final    Comment: (NOTE) If result is NEGATIVE SARS-CoV-2 target nucleic acids are NOT DETECTED. The SARS-CoV-2 RNA is generally detectable in upper and lower  respiratory specimens during the acute phase of infection. The lowest  concentration of SARS-CoV-2 viral copies this assay can detect is 250  copies / mL. A negative result does not preclude SARS-CoV-2 infection  and should not be used as the sole basis for treatment or other  patient management  decisions.  A negative result may occur with  improper specimen collection / handling, submission of specimen other  than nasopharyngeal swab, presence of viral mutation(s) within the  areas targeted by this assay, and inadequate number of viral copies  (<250 copies / mL). A negative result must be combined with clinical  observations, patient history, and epidemiological information. If result is POSITIVE SARS-CoV-2 target nucleic acids are DETECTED. The SARS-CoV-2 RNA is generally detectable in upper and lower  respiratory specimens dur ing the acute phase of infection.  Positive  results are indicative of active infection with SARS-CoV-2.  Clinical  correlation with patient history and other diagnostic information is  necessary to determine patient infection status.  Positive results do  not rule out bacterial infection or co-infection with other viruses. If result is PRESUMPTIVE POSTIVE SARS-CoV-2 nucleic acids MAY BE PRESENT.   A presumptive positive result was obtained on the submitted specimen  and confirmed on repeat testing.  While 2019 novel coronavirus  (SARS-CoV-2) nucleic acids may be present in the submitted sample  additional confirmatory testing may be necessary for epidemiological  and / or clinical management purposes  to differentiate between  SARS-CoV-2 and other Sarbecovirus currently known to infect humans.  If clinically indicated additional testing with an alternate test  methodology [phone]([id number]) is advised. The SARS-CoV-2 RNA is generally  detectable in upper and lower respiratory sp ecimens during the acute  phase of infection. The expected result is Negative. Fact Sheet for Patients:  BoilerBrush.com.cyhttps://www.fda.gov/media/136312/download Fact Sheet for Healthcare Providers: https://pope.com/https://www.fda.gov/media/136313/download This test is not yet approved or cleared by the [place] FDA and has been authorized for detection and/or diagnosis of SARS-CoV-2 by FDA under an  Emergency Use Authorization (EUA).  This EUA will remain in effect (meaning this test can be used) for the duration of the COVID-19 declaration under Section 564(b)(1) of the Act, 21 U.S.C. section 360bbb-3(b)(1), unless the authorization is terminated or revoked sooner. Performed at [facility], [address] [place], [place] [address]     Studies/Results: No results found.  Medications: Scheduled Meds: . DULoxetine  20 mg Oral Daily  . enoxaparin (LOVENOX) injection  40 mg Subcutaneous Q24H  . folic acid  1 mg Oral Daily  . HYDROmorphone   Intravenous Q4H  . hydroxyurea  2,000 mg Oral Daily  . ketorolac  15 mg Intravenous Q6H  . oxyCODONE  15 mg Oral Q12H  . senna-docusate  1 tablet Oral BID  . tiZANidine  4 mg Oral TID  . voxelotor  1,500 mg Oral Daily   Continuous Infusions: . sodium chloride 150 mL/hr at 11/08/18 0816  . diphenhydrAMINE     PRN Meds:.diphenhydrAMINE **OR** diphenhydrAMINE, naloxone **AND** sodium chloride flush, ondansetron (ZOFRAN) IV, oxyCODONE, polyethylene glycol  Consultants:  None  Procedures:  None  Antibiotics:  None  Assessment/Plan: Active Problems:   Sickle cell anemia with crisis (HCC)   Morbidly obese (HCC)   Chronic pain syndrome  Sickle cell disease with pain crisis: Decrease IV fluids 0.45% saline at 75 mL/h Weaning weight-based Dilaudid PCA, settings changed to 0.5 mg, 10-minute lockout, and 2 mg/h IV Toradol 15 mg every 6 hours Oxycodone 20 mg every 6 hours as needed for moderate to severe breakthrough pain. Maintain oxygen saturation above 90%, 2 L supplemental oxygen as needed Monitor vital signs very closely and reevaluate pain scale regularly  Sickle cell anemia: Hemoglobin is consistent with patient's baseline.  There is no clinical indication for blood transfusion on today.  Chronic pain syndrome: Continue home medications  Morbid obesity: Heart healthy diet.   The patient is asked to  make an attempt to improve diet and exercise patterns to aid in medical management of this problem.   Code Status: Full Code Family Communication: N/A Disposition Plan: Not yet ready for discharge.  Planned for 11/09/2018  Nolon Nations  APRN, MSN, FNP-C Patient Care [facility] [address] [place], [place] [address] [phone]  If 5PM-7AM, please contact night-coverage.  11/08/2018, 12:02 PM  LOS: 1 day

## 2018-11-08 NOTE — Telephone Encounter (Signed)
Mailbox full

## 2018-11-08 NOTE — Telephone Encounter (Signed)
done

## 2018-11-09 NOTE — Progress Notes (Signed)
Subjective: Stephanie Burch, a 38 year old female with a medical history significant for sickle cell disease, type [place], chronic pain syndrome, morbid obesity, opiate tolerance, and opiate dependence was admitted for sickle cell pain crisis.  Patient states that pain is not improved very much overnight.  Pain is primarily to right lower extremity characterized as constant and throbbing.  Pain is worsened with weightbearing.  Current pain intensity is 7/10.  Patient denies headache, chest, paresthesias, dysuria, vomiting, or constipation.  Patient endorses nausea.  Objective:  Vital signs in last 24 hours:  Vitals:   11/09/18 0046 11/09/18 0436 11/09/18 0439 11/09/18 0755  BP:  121/61    Pulse: (!) 104 96    Resp:  17 19 20   Temp:  97.8 F (36.6 C)    TempSrc:  Oral    SpO2: 93% 93%  96%  Weight:      Height:        Intake/Output from previous day:   Intake/Output Summary (Last 24 hours) at 11/09/2018 0914 Last data filed at 11/09/2018 0600 Gross per 24 hour  Intake 3942.97 ml  Output 300 ml  Net 3642.97 ml    Physical Exam: General: Alert, awake, oriented x3, in no acute distress.  Obese HEENT: [place]/AT PEERL, EOMI Neck: Trachea midline,  no masses, no thyromegal,y no JVD, no carotid bruit OROPHARYNX:  Moist, No exudate/ erythema/lesions.  Heart: Regular rate and rhythm, without murmurs, rubs, gallops, PMI non-displaced, no heaves or thrills on palpation.  Lungs: Clear to auscultation, no wheezing or rhonchi noted. No increased vocal fremitus resonant to percussion  Abdomen: Soft, nontender, nondistended, positive bowel sounds, no masses no hepatosplenomegaly noted..  Neuro: No focal neurological deficits noted cranial nerves II through XII grossly intact. DTRs 2+ bilaterally upper and lower extremities. Strength 5 out of 5 in bilateral upper and lower extremities. Musculoskeletal: No warm swelling or erythema around joints, no spinal tenderness noted. Psychiatric: Patient alert  and oriented x3, good insight and cognition, good recent to remote recall. Lymph node survey: No cervical axillary or inguinal lymphadenopathy noted.  Lab Results:  Basic Metabolic Panel:    Component Value Date/Time   NA 140 11/07/2018 1129   NA 142 09/11/2017 1431   K 4.0 11/07/2018 1129   CL 108 11/07/2018 1129   CO2 25 11/07/2018 1129   BUN 8 11/07/2018 1129   BUN 8 09/11/2017 1431   CREATININE 0.71 11/07/2018 1129   GLUCOSE 102 (H) 11/07/2018 1129   CALCIUM 9.0 11/07/2018 1129   CBC:    Component Value Date/Time   WBC 9.3 11/07/2018 1129   HGB 10.9 (L) 11/07/2018 1129   HGB 13.0 09/11/2017 1431   HCT 31.7 (L) 11/07/2018 1129   HCT 38.4 09/11/2017 1431   PLT 183 11/07/2018 1129   PLT 258 09/11/2017 1431   MCV 71.9 (L) 11/07/2018 1129   MCV 75 (L) 09/11/2017 1431   NEUTROABS 6.5 11/07/2018 1129   NEUTROABS 3.9 09/11/2017 1431   LYMPHSABS 1.9 11/07/2018 1129   LYMPHSABS 2.2 09/11/2017 1431   MONOABS 0.7 11/07/2018 1129   EOSABS 0.1 11/07/2018 1129   EOSABS 0.1 09/11/2017 1431   BASOSABS 0.1 11/07/2018 1129   BASOSABS 0.0 09/11/2017 1431    Recent Results (from the past 240 hour(s))  SARS Coronavirus 2 [facility](Hospital order, Performed [facility]) Nasopharyngeal Nasopharyngeal Swab     Status: None   Collection Time: 11/07/18  4:36 PM   Specimen: Nasopharyngeal Swab  Result Value Ref Range Status  SARS Coronavirus 2 NEGATIVE NEGATIVE Final    Comment: (NOTE) If result is NEGATIVE SARS-CoV-2 target nucleic acids are NOT DETECTED. The SARS-CoV-2 RNA is generally detectable in upper and lower  respiratory specimens during the acute phase of infection. The lowest  concentration of SARS-CoV-2 viral copies this assay can detect is 250  copies / mL. A negative result does not preclude SARS-CoV-2 infection  and should not be used as the sole basis for treatment or other  patient management decisions.  A negative result may occur with  improper specimen  collection / handling, submission of specimen other  than nasopharyngeal swab, presence of viral mutation(s) within the  areas targeted by this assay, and inadequate number of viral copies  (<250 copies / mL). A negative result must be combined with clinical  observations, patient history, and epidemiological information. If result is POSITIVE SARS-CoV-2 target nucleic acids are DETECTED. The SARS-CoV-2 RNA is generally detectable in upper and lower  respiratory specimens dur ing the acute phase of infection.  Positive  results are indicative of active infection with SARS-CoV-2.  Clinical  correlation with patient history and other diagnostic information is  necessary to determine patient infection status.  Positive results do  not rule out bacterial infection or co-infection with other viruses. If result is PRESUMPTIVE POSTIVE SARS-CoV-2 nucleic acids MAY BE PRESENT.   A presumptive positive result was obtained on the submitted specimen  and confirmed on repeat testing.  While 2019 novel coronavirus  (SARS-CoV-2) nucleic acids may be present in the submitted sample  additional confirmatory testing may be necessary for epidemiological  and / or clinical management purposes  to differentiate between  SARS-CoV-2 and other Sarbecovirus currently known to infect humans.  If clinically indicated additional testing with an alternate test  methodology [phone]) is advised. The SARS-CoV-2 RNA is generally  detectable in upper and lower respiratory sp ecimens during the acute  phase of infection. The expected result is Negative. Fact Sheet for Patients:  StrictlyIdeas.no Fact Sheet for Healthcare Providers: BankingDealers.co.za This test is not yet approved or cleared by the [place] FDA and has been authorized for detection and/or diagnosis of SARS-CoV-2 by FDA under an Emergency Use Authorization (EUA).  This EUA will remain in effect  (meaning this test can be used) for the duration of the COVID-19 declaration under Section 564(b)(1) of the Act, 21 U.S.C. section 360bbb-3(b)(1), unless the authorization is terminated or revoked sooner. Performed at [facility], [place] [address] [place], [place] [address]     Studies/Results: No results found.  Medications: Scheduled Meds: . DULoxetine  20 mg Oral Daily  . enoxaparin (LOVENOX) injection  40 mg Subcutaneous Q24H  . folic acid  1 mg Oral Daily  . HYDROmorphone   Intravenous Q4H  . hydroxyurea  2,000 mg Oral Daily  . ketorolac  15 mg Intravenous Q6H  . oxyCODONE  15 mg Oral Q12H  . senna-docusate  1 tablet Oral BID  . tiZANidine  4 mg Oral TID  . voxelotor  1,500 mg Oral Daily   Continuous Infusions: . sodium chloride 75 mL/hr at 11/09/18 0720  . diphenhydrAMINE     PRN Meds:.diphenhydrAMINE **OR** diphenhydrAMINE, naloxone **AND** sodium chloride flush, ondansetron (ZOFRAN) IV, oxyCODONE, polyethylene glycol  Consultants:  None  Procedures:  None  Antibiotics:  None  Assessment/Plan: Active Problems:   Sickle cell anemia with crisis ([place])   Morbidly obese (HCC)   Chronic pain syndrome  Sickle cell disease with pain crisis: Continue  [facility] fluids at [facility] Continue IV Dilaudid via PCA.  No changes in settings on today IV Toradol 15 mg every 6 hours Oxycodone 20 mg every 6 hours as needed for moderate to severe breakthrough pain Maintain oxygen saturation above 90%, 2 L supplemental oxygen as needed Monitor vital signs very closely  Sickle cell anemia: Hemoglobin stable.  Consistent with baseline.  No clinical indication for blood transfusion on today. CBC in a.m.  Chronic pain syndrome: Continue home medications  Morbid obesity: Recommend heart healthy diet.. The patient is asked to make an attempt to improve diet and exercise patterns to aid in medical management of this problem.   Code Status: Full Code Family  Communication: N/A Disposition Plan: Not yet ready for discharge  Christell Steinmiller Rennis Petty  APRN, MSN, FNP-C [facility] [address] [place], [place] [address] [phone]  If 5PM-7AM, please contact night-coverage.  11/09/2018, 9:14 AM  LOS: 2 days

## 2018-11-09 NOTE — Progress Notes (Signed)
Yellow MEWS score triggered in error due to documentation of wrong number for respirations. Documentation corrected immediately.

## 2018-11-09 NOTE — BH Specialist Note (Signed)
Integrated Behavioral Health Note  Reason for Referral: Stephanie Burch is a 38 y.o. female  Pt reports the following concerns:   Plan: 1. Addressed today: Met with patient briefly at bedside to check in regarding rent issues. Previously referred patient to [facility] for assistance. Patient reports [facility] referred to an agency in [place] and patient has an appointment with them later this month to assess for eligibility for assistance.   2. Referral: none  3. Follow up: 11/15/18 appt in clinic  Estanislado Emms, [place] [facility] [phone]

## 2018-11-10 ENCOUNTER — Telehealth: Payer: Self-pay

## 2018-11-10 ENCOUNTER — Inpatient Hospital Stay (HOSPITAL_COMMUNITY): Payer: Medicaid Other

## 2018-11-10 ENCOUNTER — Other Ambulatory Visit: Payer: Self-pay | Admitting: Family Medicine

## 2018-11-10 DIAGNOSIS — D571 Sickle-cell disease without crisis: Secondary | ICD-10-CM

## 2018-11-10 DIAGNOSIS — G894 Chronic pain syndrome: Secondary | ICD-10-CM

## 2018-11-10 DIAGNOSIS — F1190 Opioid use, unspecified, uncomplicated: Secondary | ICD-10-CM

## 2018-11-10 LAB — TROPONIN I (HIGH SENSITIVITY)
Troponin I (High Sensitivity): 11 ng/L (ref <18)
Troponin I (High Sensitivity): 10 ng/L (ref <18)

## 2018-11-10 MED ORDER — OXYCODONE HCL 20 MG PO TABS: 20.0000 mg | ORAL_TABLET | ORAL | 0 refills | Status: DC | PRN

## 2018-11-10 MED ORDER — OXYCODONE HCL 5 MG PO TABS
20.0000 mg | ORAL_TABLET | ORAL | Status: DC
  Administered 2018-11-10 – 2018-11-11 (×4): 20 mg via ORAL
  Filled 2018-11-10 (×4): qty 4

## 2018-11-10 NOTE — Progress Notes (Signed)
Stephanie Burch for night coverage paged @ [phone] . Awaiting return call.

## 2018-11-10 NOTE — Progress Notes (Addendum)
Patient updated about MD notification and plans for workup. Patient now requesting to know if staff will be giving her pain medication since she is not going home right now. This Probation officer informed patient that MD would be making the determination on and pain medication and current workup for chest tightness,  nurse discussed missed doses of medication due to patient wanting to make sure that she was clear and not woozy for discharge. Patient now insisting that she receive her pain medication for leg pain right now. Nurse tech instructed to complete ekg as requested by MD and patient made aware that pain issues would be addressed appropriately.

## 2018-11-10 NOTE — Telephone Encounter (Signed)
Called, no answer. Left a message that rx was sent to pharmacy but can not be filled until 11/14/2018 and that long acting medication is ready to be picked up at pharmacy. Thanks!

## 2018-11-10 NOTE — Discharge Instructions (Signed)
Sickle Cell Anemia, Adult ° °Sickle cell anemia is a condition in which red blood cells have an abnormal “sickle” shape. Red blood cells carry oxygen through the body. Sickle-shaped red blood cells do not live as long as normal red blood cells. They also clump together and block blood from flowing through the blood vessels. This condition prevents the body from getting enough oxygen. Sickle cell anemia causes organ damage and pain. It also increases the risk of infection. °What are the causes? °This condition is caused by a gene that is passed from parent to child (inherited). Receiving two copies of the gene causes the disease. Receiving one copy causes the "trait," which means that symptoms are milder or not present. °What increases the risk? °This condition is more likely to develop if your ancestors were from [place], the Mediterranean, [place] or [place], the [place], [place], or the [place]. °What are the signs or symptoms? °Symptoms of this condition include: °· Episodes of pain (crises), especially in the hands and feet, joints, back, chest, or abdomen. The pain can be triggered by: °? An illness, especially if there is dehydration. °? Doing an activity with great effort (overexertion). °? Exposure to extreme temperature changes. °? High altitude. °· Fatigue. °· Shortness of breath or difficulty breathing. °· Dizziness. °· Pale skin or yellowed skin (jaundice). °· Frequent bacterial infections. °· Pain and swelling in the hands and feet (hand-food syndrome). °· Prolonged, painful erection of the penis (priapism). °· Acute chest syndrome. Symptoms of this include: °? Chest pain. °? Fever. °? Cough. °? Fast breathing. °· Stroke. °· Decreased activity. °· Loss of appetite. °· Change in behavior. °· Headaches. °· Seizures. °· Vision changes. °· Skin ulcers. °· Heart disease. °· High blood pressure. °· Gallstones. °· Liver and kidney problems. °How is this diagnosed? °This condition is diagnosed with  blood tests that check for the gene that causes this condition. °How is this treated? °There is no cure for most cases of this condition. Treatment focuses on managing your symptoms and preventing complications of the disease. Your health care provider will work with you to identify the best treatment options for you based on an assessment of your condition. Treatment may include: °· Medicines, including: °? Pain medicines. °? Antibiotic medicines for infection. °? Medicines to increase the production of a protein in red blood cells that helps carry oxygen in the body (hemoglobin). °· Fluids to treat pain and swelling. °· Oxygen to treat acute chest syndrome. °· Blood transfusions to treat symptoms such as fatigue, stroke, and acute chest syndrome. °· Massage and physical therapy for pain. °· Regular tests to monitor your condition, such as blood tests, X-rays, CT scans, MRI scans, ultrasounds, and lung function tests. These should be done every 3-12 months, depending on your age. °· Hematopoietic stem cell transplant. This is a procedure to replace abnormal stem cells with healthy stem cells from a donor's bone marrow. Stem cells are cells that can develop into blood cells, and bone marrow is the spongy tissue inside the bones. °Follow these instructions at home: °Medicines °· Take over-the-counter and prescription medicines only as told by your health care provider. °· If you were prescribed an antibiotic medicine, take it as told by your health care provider. Do not stop taking the antibiotic even if you start to feel better. °· If you develop a fever, do not take medicines to reduce the fever right away. This could cover up another problem. Notify your health care provider. °Managing   pain, stiffness, and swelling °· Try these methods to help ease your pain: °? Using a heating pad. °? Taking a warm bath. °? Distracting yourself, such as by watching TV. °Eating and drinking °· Drink enough fluid to keep your urine  clear or pale yellow. Drink more in hot weather and during exercise. °· Limit or avoid drinking alcohol. °· Eat a balanced and nutritious diet. Eat plenty of fruits, vegetables, whole grains, and lean protein. °· Take vitamins and supplements as directed by your health care provider. °Traveling °· When traveling, keep these with you: °? Your medical information. °? The names of your health care providers. °? Your medicines. °· If you have to travel by air, ask about precautions you should take. °Activity °· Get plenty of rest. °· Avoid activities that will lower your oxygen levels, such as exercising vigorously. °General instructions °· Do not use any products that contain nicotine or tobacco, such as cigarettes and e-cigarettes. They lower blood oxygen levels. If you need help quitting, ask your health care provider. °· Consider wearing a medical alert bracelet. °· Avoid high altitudes. °· Avoid extreme temperatures and extreme temperature changes. °· Keep all follow-up visits as told by your health care provider. This is important. °Contact a health care provider if: °· You develop joint pain. °· Your feet or hands swell or have pain. °· You have fatigue. °Get help right away if: °· You have symptoms of infection. These include: °? Fever. °? Chills. °? Extreme tiredness. °? Irritability. °? Poor eating. °? Vomiting. °· You feel dizzy or faint. °· You have new abdominal pain, especially on the left side near the stomach area. °· You develop priapism. °· You have numbness in your arms or legs or have trouble moving them. °· You have trouble talking. °· You develop pain that cannot be controlled with medicine. °· You become short of breath. °· You have rapid breathing. °· You have a persistent cough. °· You have pain in your chest. °· You develop a severe headache or stiff neck. °· You feel bloated without eating or after eating a small amount of food. °· Your skin is pale. °· You suddenly lose  vision. °Summary °· Sickle cell anemia is a condition in which red blood cells have an abnormal “sickle” shape. This disease can cause organ damage and chronic pain, and it can raise your risk of infection. °· Sickle cell anemia is a genetic disorder. °· Treatment focuses on managing your symptoms and preventing complications of the disease. °· Get medical help right away if you have any signs of infection, such as a fever. °This information is not intended to replace advice given to you by your health care provider. Make sure you discuss any questions you have with your health care provider. °Document Released: 04/30/2005 Document Revised: 05/14/2018 Document Reviewed: 02/26/2016 °[employer]vier Patient Education © 2020 [employer] ° °

## 2018-11-10 NOTE — Discharge Summary (Addendum)
Physician Discharge Summary  Stephanie Burch ZOX:[id number] DOB: Jul 29, 1980 DOA: 11/07/2018  PCP: Kallie LocksStroud, [name], FNP  Admit date: 11/07/2018  Discharge date: 11/10/2018  Discharge Diagnoses:  Active Problems:   Sickle cell anemia with crisis (HCC)   Morbidly obese (HCC)   Chronic pain syndrome   Discharge Condition: Stable  Disposition:  Follow-up Information    Kallie LocksStroud, [name], FNP Follow up in 2 week(s).   Specialty: Family Medicine Contact information: [address] [place] [id number] [phone]          Pt is discharged home in good condition and is to follow up with Kallie LocksStroud, [name], FNP in 1 week to have labs evaluated. Nathaniel ManKimaada Whipkey is instructed to increase activity slowly and balance with rest for the next few days, and use prescribed medication to complete treatment of pain  Diet: Regular Wt Readings from Last 3 Encounters:  11/10/18 125.7 kg  10/01/18 125 kg  09/20/18 (!) 169.3 kg    History of present illness:  Stephanie StarchKimaada Auriemma, a 38 year old female with a medical history significant for sickle cell anemia, hemoglobin , chronic pain syndrome, morbid obesity, opiate tolerance and dependence who came to the emergency department with major complaints of pain mostly in her bilateral lower extremities that is consistent with her typical sickle cell pain crisis.  Patient claims this pain has slowly increased over the last several days and is no longer responding to her home medications.  Patient states that she is taken a little more than her usual medications with no sustained relief.  Her current pain intensity is 10/10 characterized as constant and throbbing.  She denies any fever, shortness of breath, headache, dizziness, dysuria, nausea, vomiting, or diarrhea.  She denies any contact or exposure to COVID-19 patients.  ED course: Patient was found to be afebrile, saturating well on room air with vital signs that were largely within normal limits.  Her  immediate laboratory tests were unremarkable including her CBC and CMP.  Her hemoglobin was 10.9, which is at baseline, Soh cell was normal, her comprehensive metabolic panel was largely within normal limits.  Awaiting coronavirus greater result.  Patient was given multiple doses of Dilaudid IV per ED protocol, there was no significant improvement in her pain, patient will be admitted for sickle cell pain crisis and further evaluation.  Hospital Course:  Sickle cell disease with pain crisis: Patient was admitted for sickle cell pain crisis and managed appropriately with IVF, IV Dilaudid via PCA and IV Toradol, as well as other adjunct therapies per sickle cell pain management protocols.  IV Dilaudid was weaned appropriately.  Patient transition to home medications.  She states the pain intensity has decreased and is 5/10, which is consistent with her baseline.  Patient's hemoglobin remained stable throughout admission.  It was consistent with her baseline of 10-11 g/dL.  Discussed medication management at length.  Advised patient to contact PCP for medication refills.  Also, patient will need a first available appointment to discuss pain management.  Patient may be a candidate for Suboxone going forward. Patient advised to continue folic acid and hydroxyurea.  Also, patient may benefit from Mexicoxbryta going forward.   Patient is not currently under the care of hematologist. Patient alert, oriented, and ambulating without assistance.  Oxygen saturation 100% on RA.  Patient was discharged home today in a hemodynamically stable condition.   Discharge Exam: Vitals:   11/10/18 1940 11/10/18 2335  BP: (!) 130/59 103/67  Pulse: (!) 115 (!) 104  Resp: 15 18  Temp: 100.2 F (37.9 C) 99.4 F (37.4 C)  SpO2: 96% 93%   Vitals:   11/10/18 1352 11/10/18 1547 11/10/18 1940 11/10/18 2335  BP:  107/72 (!) 130/59 103/67  Pulse:  (!) 103 (!) 115 (!) 104  Resp: (!) Temp:  98.9 F (37.2 C) 100.2  F (37.9 C) 99.4 F (37.4 C)  TempSrc:  Oral Oral Oral  SpO2:  92% 96% 93%  Weight:      Height:        General appearance : Awake, alert, not in any distress. Speech Clear. Not toxic looking, obese HEENT: Atraumatic and Normocephalic, pupils equally reactive to light and accomodation Neck: Supple, no JVD. No cervical lymphadenopathy.  Chest: Good air entry bilaterally, no added sounds  CVS: S1 S2 regular, no murmurs.  Abdomen: Bowel sounds present, Non tender and not distended with no gaurding, rigidity or rebound. Extremities: B/L Lower Ext shows no edema, both legs are warm to touch Neurology: Awake alert, and oriented X 3, CN II-XII intact, Non focal Skin: No Rash  Discharge Instructions  Discharge Instructions    Discharge patient   Complete by: As directed    Discharge disposition: 01-Home or Self Care   Discharge patient date: 11/10/2018     Allergies as of 11/10/2018   No Known Allergies     Medication List    TAKE these medications   docusate sodium 100 MG capsule Commonly known as: Colace Take 1 capsule (100 mg total) by mouth 2 (two) times daily.   DULoxetine 20 MG capsule Commonly known as: CYMBALTA TAKE 1 CAPSULE BY MOUTH EVERY DAY   folic acid 1 MG tablet Commonly known as: FOLVITE Take 1 tablet (1 mg total) by mouth daily.   hydroxyurea 500 MG capsule Commonly known as: Hydrea Take 4 capsules (2,000 mg total) by mouth daily. May take with food to minimize GI side effects.   ibuprofen 800 MG tablet Commonly known as: ADVIL Take 1 tablet (800 mg total) by mouth every 8 (eight) hours as needed for headache, mild pain, moderate pain or cramping.   naloxone 0.4 MG/ML injection Commonly known as: NARCAN As needed   ondansetron 4 MG tablet Commonly known as: Zofran Take 1 tablet (4 mg total) by mouth every 8 (eight) hours as needed for nausea or vomiting.   Oxbryta 500 MG Tabs tablet Generic drug: voxelotor Take 1,500 mg by mouth daily. For  Sickle Cell Anemia   Oxycodone HCl 20 MG Tabs Take 1 tablet (20 mg total) by mouth every 4 (four) hours as needed (for pain). Start taking on: November 12, 2018   prochlorperazine 10 MG tablet Commonly known as: COMPAZINE Take 1 tablet (10 mg total) by mouth every 6 (six) hours as needed for nausea or vomiting. Take if Ondansetron is not effective.   tiZANidine 4 MG capsule Commonly known as: ZANAFLEX TAKE 1 CAPSULE (4 MG TOTAL) BY MOUTH 3 (THREE) TIMES DAILY.   Vitamin D (Ergocalciferol) 1.25 MG (50000 UT) Caps capsule Commonly known as: DRISDOL Take 1 capsule (50,000 Units total) by mouth every 7 (seven) days. Notes to patient: Keep every 7 day schedule   Xtampza ER 13.5 MG C12a Generic drug: oxyCODONE ER Take 13.5 mg by mouth every 12 (twelve) hours.       The results of significant diagnostics from this hospitalization (including imaging, microbiology, ancillary and laboratory) are listed below for reference.    Significant Diagnostic Studies: Dg Chest  2 View  Result Date: 11/10/2018 CLINICAL DATA:  Hypoxia. EXAM: CHEST - 2 VIEW COMPARISON:  10/18/2017 FINDINGS: Enlarged cardiac silhouette with an interval increase in size. Prominent pulmonary vasculature, increased. Interval minimally prominent interstitial markings. No pleural fluid. Unremarkable bones. IMPRESSION: 1. Interval cardiomegaly and pulmonary vascular congestion. 2. Interval minimal interstitial pulmonary edema. Electronically Signed   By: Beckie Salts M.D.   On: 11/10/2018 13:24    Microbiology: Recent Results (from the past 240 hour(s))  SARS Coronavirus 2 [facility] order, Performed in [facility] hospital lab) Nasopharyngeal Nasopharyngeal Swab     Status: None   Collection Time: 11/07/18  4:36 PM   Specimen: Nasopharyngeal Swab  Result Value Ref Range Status   SARS Coronavirus 2 NEGATIVE NEGATIVE Final    Comment: (NOTE) If result is NEGATIVE SARS-CoV-2 target nucleic acids are NOT DETECTED. The  SARS-CoV-2 RNA is generally detectable in upper and lower  respiratory specimens during the acute phase of infection. The lowest  concentration of SARS-CoV-2 viral copies this assay can detect is 250  copies / mL. A negative result does not preclude SARS-CoV-2 infection  and should not be used as the sole basis for treatment or other  patient management decisions.  A negative result may occur with  improper specimen collection / handling, submission of specimen other  than nasopharyngeal swab, presence of viral mutation(s) within the  areas targeted by this assay, and inadequate number of viral copies  (<250 copies / mL). A negative result must be combined with clinical  observations, patient history, and epidemiological information. If result is POSITIVE SARS-CoV-2 target nucleic acids are DETECTED. The SARS-CoV-2 RNA is generally detectable in upper and lower  respiratory specimens dur ing the acute phase of infection.  Positive  results are indicative of active infection with SARS-CoV-2.  Clinical  correlation with patient history and other diagnostic information is  necessary to determine patient infection status.  Positive results do  not rule out bacterial infection or co-infection with other viruses. If result is PRESUMPTIVE POSTIVE SARS-CoV-2 nucleic acids MAY BE PRESENT.   A presumptive positive result was obtained on the submitted specimen  and confirmed on repeat testing.  While 2019 novel coronavirus  (SARS-CoV-2) nucleic acids may be present in the submitted sample  additional confirmatory testing may be necessary for epidemiological  and / or clinical management purposes  to differentiate between  SARS-CoV-2 and other Sarbecovirus currently known to infect humans.  If clinically indicated additional testing with an alternate test  methodology [id number]) is advised. The SARS-CoV-2 RNA is generally  detectable in upper and lower respiratory sp ecimens during the acute   phase of infection. The expected result is Negative. Fact Sheet for Patients:  BoilerBrush.com.cy Fact Sheet for Healthcare Providers: https://pope.com/ This test is not yet approved or cleared by the [place] FDA and has been authorized for detection and/or diagnosis of SARS-CoV-2 by FDA under an Emergency Use Authorization (EUA).  This EUA will remain in effect (meaning this test can be used) for the duration of the COVID-19 declaration under Section 564(b)(1) of the Act, 21 U.S.C. section 360bbb-3(b)(1), unless the authorization is terminated or revoked sooner. Performed at [facility], [address], [place], [place] [address]      Labs: Basic Metabolic Panel: Recent Labs  Lab 11/07/18 1129  NA 140  K 4.0  CL 108  CO2 25  GLUCOSE 102*  BUN 8  CREATININE 0.71  CALCIUM 9.0   Liver Function Tests: Recent Labs  Lab 11/07/18 1129  AST 14*  ALT 16  ALKPHOS 82  BILITOT 1.6*  PROT 7.0  ALBUMIN 3.9   No results for input(s): LIPASE, AMYLASE in the last 168 hours. No results for input(s): AMMONIA in the last 168 hours. CBC: Recent Labs  Lab 11/07/18 1129  WBC 9.3  NEUTROABS 6.5  HGB 10.9*  HCT 31.7*  MCV 71.9*  PLT 183   Cardiac Enzymes: No results for input(s): CKTOTAL, CKMB, CKMBINDEX, TROPONINI in the last 168 hours. BNP: Invalid input(s): POCBNP CBG: No results for input(s): GLUCAP in the last 168 hours.  Time coordinating discharge: 50 minutes  Signed:  [name]  APRN, MSN, FNP-C [facility] [address] [place], [place] [address] [phone]  [facility] 11/10/2018, 11:56 PM

## 2018-11-10 NOTE — Progress Notes (Signed)
Patient made aware that she has been discharged. Patient is requesting to take a nap to make sure her system is clear of medications prior to discharging.

## 2018-11-10 NOTE — Progress Notes (Signed)
RN called earlier stating pt did not want to go home today. She says she had chest tightness when she was ambulating in the room. Went away with rest. At rest, she said she had palpitations. Pain described as tightness, 5/10, not radiating, no n/v or diaphoresis. No SOB. EKG done which shows ST with non specific [place] abnormality which is the same on her EKG yesterday. HS troponin is 11. Do not believe this to be ACS. Pt feels uneasy about leaving, so we will keep her overnight. KJKG, NP [facility]

## 2018-11-10 NOTE — Progress Notes (Signed)
Patient called for wheelchair for discharge. When staff enters room , patient complains of feeling tightness in chest when ambulating around room. Will page on call MD for guidance with discharge.

## 2018-11-10 NOTE — Progress Notes (Addendum)
Discharge instructions reviewed with patient. Patient verbalized understanding and has no complaints or concerns.Patient will call staff when she is dressed and ready to be wheeled down  For discharge.

## 2018-11-10 NOTE — Telephone Encounter (Signed)
Rx addressed by Kathe Becton.

## 2018-11-10 NOTE — Progress Notes (Addendum)
Spoke with Baltazar Najjar NP about patients c/o chest tightness with movement and currently feeling like she is having palpitations. Orders entered for EKG  And Troponin levels. Will follow up with result. Oncoming nurse made aware and will continue to monitor the patient.

## 2018-11-10 NOTE — Progress Notes (Signed)
   11/10/18 0921  MEWS Score  Resp 17  Pulse Rate (!) 111  BP 126/64  Temp 98.4 F (36.9 C)  SpO2 (!) 89 %  O2 Device Room Air  MEWS Score  MEWS RR 0  MEWS Pulse 2  MEWS Systolic 0  MEWS LOC 0  MEWS Temp 0  MEWS Score 2  MEWS Score Color Yellow  Spoke with patient. Patient reports going to restroom and feeling as if her heart was fluttering. Changed/applied pulse ox finger probe. Pulse ox 89% on Room air. Once in bed and resting, pulse ox up to 94%. Patient states heart fluttering stills remain, but not like before. Notified [place] Hollis. Orders received. O2 at 2L via [place] applied.

## 2018-11-10 NOTE — Telephone Encounter (Signed)
Patient called and is asking for a refill on oxycodone 10mg  (short acting) she states it is early because she had to use it for a crisis. She said you where aware of her doing this. Please advise. Thanks!

## 2018-11-11 DIAGNOSIS — R7981 Abnormal blood-gas level: Secondary | ICD-10-CM

## 2018-11-11 NOTE — Progress Notes (Signed)
Huyen Perazzo, a 38 year old female with a medical history significant for sickle cell disease, hemoglobin , chronic pain syndrome, morbid obesity, opiate tolerance, opiate dependence, and history of anemia of chronic disease was admitted for sickle cell pain crisis.  Patient was assessed and discharged on 11/10/2018 in a hemodynamically stable condition.  Patient requested to take a nap prior to discharge, discussed with RN.  Late last night, patient states that she experienced heart fluttering.  At that time, patient had a repeat EKG, chest x-ray reviewed, no changes in labs or vital signs.  Patient states that she was under a great deal of stress about returning home.  She states that prior to hospital admission, her pain medications were not effective so she had to take more than prescribed.  Discussed patient's medication regimen at length.  Her pain intensity is 4- 5/10, which is consistent with her baseline.  Patient alert, oriented, and ambulating without assistance.  She will follow-up in clinic with PCP on 11/15/2018, patient also has an appointment scheduled with the clinical social worker on that day.   [name]  APRN, MSN, FNP-C [facility] [address] [place], [place] [address] [phone]

## 2018-11-11 NOTE — Telephone Encounter (Signed)
Patient want to know if anyone else will send in the 10 tablet, today? Because the 20 MG tablet does not work will and it crumbles when she cuts it.   Please advise.

## 2018-11-11 NOTE — Telephone Encounter (Signed)
Patient called back:   Oxycodone 20 MG is not going to work. She sometimes take 10 MG's. Also in the past Medicare has given her a problem with covering the medication.  Per pt:  Oxycodone 10 MG every 15 days will be okay with patient and Medicare.   Please advise.  CB# [phone]

## 2018-11-11 NOTE — Progress Notes (Signed)
Reviewed discharge paperwork and medication regimen with patient. Patient discharged via wheelchair by NT.

## 2018-11-15 ENCOUNTER — Ambulatory Visit (INDEPENDENT_AMBULATORY_CARE_PROVIDER_SITE_OTHER): Payer: Medicaid Other | Admitting: Clinical

## 2018-11-15 ENCOUNTER — Encounter: Payer: Self-pay | Admitting: Family Medicine

## 2018-11-15 ENCOUNTER — Other Ambulatory Visit: Payer: Self-pay

## 2018-11-15 ENCOUNTER — Ambulatory Visit (INDEPENDENT_AMBULATORY_CARE_PROVIDER_SITE_OTHER): Payer: Medicaid Other | Admitting: Family Medicine

## 2018-11-15 VITALS — BP 130/76 | HR 92 | Temp 98.5°F | Ht 65.0 in | Wt 271.1 lb

## 2018-11-15 DIAGNOSIS — D571 Sickle-cell disease without crisis: Secondary | ICD-10-CM

## 2018-11-15 DIAGNOSIS — Z09 Encounter for follow-up examination after completed treatment for conditions other than malignant neoplasm: Secondary | ICD-10-CM

## 2018-11-15 DIAGNOSIS — Z79899 Other long term (current) drug therapy: Secondary | ICD-10-CM

## 2018-11-15 DIAGNOSIS — F419 Anxiety disorder, unspecified: Secondary | ICD-10-CM | POA: Insufficient documentation

## 2018-11-15 DIAGNOSIS — F1190 Opioid use, unspecified, uncomplicated: Secondary | ICD-10-CM

## 2018-11-15 DIAGNOSIS — E559 Vitamin D deficiency, unspecified: Secondary | ICD-10-CM | POA: Insufficient documentation

## 2018-11-15 DIAGNOSIS — G894 Chronic pain syndrome: Secondary | ICD-10-CM | POA: Diagnosis not present

## 2018-11-15 DIAGNOSIS — R110 Nausea: Secondary | ICD-10-CM | POA: Insufficient documentation

## 2018-11-15 LAB — POCT URINALYSIS DIPSTICK
Bilirubin, UA: NEGATIVE
Blood, UA: NEGATIVE
Glucose, UA: NEGATIVE
Ketones, UA: NEGATIVE
Leukocytes, UA: NEGATIVE
Nitrite, UA: NEGATIVE
Protein, UA: NEGATIVE
Spec Grav, UA: 1.025 (ref 1.010–1.025)
Urobilinogen, UA: 1 E.U./dL
pH, UA: 6 (ref 5.0–8.0)

## 2018-11-15 MED ORDER — FOLIC ACID 1 MG PO TABS: 1.0000 mg | ORAL_TABLET | Freq: Every day | ORAL | 11 refills | Status: DC

## 2018-11-15 MED ORDER — HYDROXYUREA 500 MG PO CAPS: 2000.0000 mg | ORAL_CAPSULE | Freq: Every day | ORAL | 5 refills | Status: DC

## 2018-11-15 MED ORDER — VITAMIN D (ERGOCALCIFEROL) 1.25 MG (50000 UNIT) PO CAPS: 50000.0000 [IU] | ORAL_CAPSULE | ORAL | 3 refills | Status: DC

## 2018-11-15 MED ORDER — PROCHLORPERAZINE MALEATE 10 MG PO TABS: 10.0000 mg | ORAL_TABLET | Freq: Four times a day (QID) | ORAL | 3 refills | Status: DC | PRN

## 2018-11-15 NOTE — Progress Notes (Signed)
[facility]   Hospital Follow Up  Subjective:  Patient ID: Stephanie Burch, female    DOB: [birth year]  Age: 38 y.o. MRN: [id number]  CC:  Chief Complaint  Patient presents with  . Hospitalization Follow-up    Sickle Cell Follow  . Follow-up    2 mth reg f/u    HPI Stephanie Burch is a 38 year old female who presents for Hospital Follow Up today.  Past Medical History:  Diagnosis Date  . Sickle cell anemia (HCC)    Current Status: Since her last office visit, she is doing well with no complaints. She states that she has mild joint pain. She rates her pain today at 0/10. She has not had a hospital visit for Sickle Cell Crisis since 11/07/2018  where she treated and discharged on 11/11/2018. She is currently taking all medications as prescribed and staying well hydrated. She reports occasional nausea, constipation, dizziness and headaches. Her anxiety is mild today. She denies suicidal ideations, homicidal ideations, or auditory hallucinations. She is scheduled to see Darl Pikes today. She continues to take Cymbalta daily as prescribed. She denies fevers, chills, recent infections, weight loss, and night sweats. She has not had any headaches, visual changes, dizziness, and falls. No chest pain, heart palpitations, cough and shortness of breath reported. No reports of GI problems such as vomiting, and diarrhea. She has no reports of blood in stools, dysuria and hematuria. She denies pain today.   History reviewed. No pertinent surgical history.  Family History  Problem Relation Age of Onset  . Hypertension Mother   . Sickle cell trait Mother   . Diabetes Father   . Sickle cell trait Father     Social History   Socioeconomic History  . Marital status: Single    Spouse name: Not on file  . Number of children: Not on file  . Years of education: Not on file  . Highest education level: Not on file  Occupational History  . Not on file  Social  Needs  . Financial resource strain: Not on file  . Food insecurity    Worry: Not on file    Inability: Not on file  . Transportation needs    Medical: Not on file    Non-medical: Not on file  Tobacco Use  . Smoking status: Never Smoker  . Smokeless tobacco: Never Used  Substance and Sexual Activity  . Alcohol use: Never    Frequency: Never  . Drug use: Never  . Sexual activity: Yes    Birth control/protection: None  Lifestyle  . Physical activity    Days per week: Not on file    Minutes per session: Not on file  . Stress: Not on file  Relationships  . Social Musician on phone: Not on file    Gets together: Not on file    Attends religious service: Not on file    Active member of club or organization: Not on file    Attends meetings of clubs or organizations: Not on file    Relationship status: Not on file  . Intimate partner violence    Fear of current or ex partner: Not on file    Emotionally abused: Not on file    Physically abused: Not on file    Forced sexual activity: Not on file  Other Topics Concern  . Not on file  Social History Narrative  . Not on file  Outpatient Medications Prior to Visit  Medication Sig Dispense Refill  . docusate sodium (COLACE) 100 MG capsule Take 1 capsule (100 mg total) by mouth 2 (two) times daily. 10 capsule 0  . DULoxetine (CYMBALTA) 20 MG capsule TAKE 1 CAPSULE BY MOUTH EVERY DAY 90 capsule 1  . ibuprofen (ADVIL) 800 MG tablet Take 1 tablet (800 mg total) by mouth every 8 (eight) hours as needed for headache, mild pain, moderate pain or cramping. 30 tablet 3  . naloxone (NARCAN) 0.4 MG/ML injection As needed 1 mL 0  . ondansetron (ZOFRAN) 4 MG tablet Take 1 tablet (4 mg total) by mouth every 8 (eight) hours as needed for nausea or vomiting. 30 tablet 2  . oxyCODONE ER (XTAMPZA ER) 13.5 MG C12A Take 13.5 mg by mouth every 12 (twelve) hours. 60 capsule 0  . Oxycodone HCl 20 MG TABS Take 1 tablet (20 mg total) by mouth  every 4 (four) hours as needed (for pain). 90 tablet 0  . tiZANidine (ZANAFLEX) 4 MG capsule TAKE 1 CAPSULE (4 MG TOTAL) BY MOUTH 3 (THREE) TIMES DAILY. 90 capsule 1  . voxelotor (OXBRYTA) 500 MG TABS tablet Take 1,500 mg by mouth daily. For Sickle Cell Anemia    . folic acid (FOLVITE) 1 MG tablet Take 1 tablet (1 mg total) by mouth daily. 30 tablet 11  . hydroxyurea (HYDREA) 500 MG capsule Take 4 capsules (2,000 mg total) by mouth daily. May take with food to minimize GI side effects. 120 capsule 5  . prochlorperazine (COMPAZINE) 10 MG tablet Take 1 tablet (10 mg total) by mouth every 6 (six) hours as needed for nausea or vomiting. Take if Ondansetron is not effective. 30 tablet 3  . Vitamin D, Ergocalciferol, (DRISDOL) 50000 units CAPS capsule Take 1 capsule (50,000 Units total) by mouth every 7 (seven) days. 5 capsule 2   No facility-administered medications prior to visit.     No Known Allergies  ROS Review of Systems  Constitutional: Positive for fatigue.  HENT: Negative.   Eyes: Negative.   Respiratory: Negative.   Cardiovascular: Negative.   Gastrointestinal: Positive for abdominal distention.  Endocrine: Negative.   Genitourinary: Negative.   Musculoskeletal: Positive for arthralgias (generalized joint pain).  Skin: Negative.   Allergic/Immunologic: Negative.   Neurological: Positive for dizziness (occasional) and headaches (occasional).  Hematological: Negative.   Psychiatric/Behavioral: Negative.       Objective:    Physical Exam  Constitutional: She is oriented to person, place, and time. She appears well-developed and well-nourished.  HENT:  Head: Normocephalic and atraumatic.  Eyes: Conjunctivae are normal.  Neck: Normal range of motion. Neck supple.  Cardiovascular: Normal rate, regular rhythm, normal heart sounds and intact distal pulses.  Pulmonary/Chest: Effort normal and breath sounds normal.  Abdominal: Soft. Bowel sounds are normal. She exhibits  distension (obese).  Musculoskeletal: Normal range of motion.  Neurological: She is alert and oriented to person, place, and time. She has normal reflexes.  Skin: Skin is warm and dry.  Psychiatric: She has a normal mood and affect. Her behavior is normal. Judgment and thought content normal.  Nursing note and vitals reviewed.   BP 130/76 (BP Location: Left Arm, Patient Position: Sitting, Cuff Size: Large)   Pulse 92   Temp 98.5 F (36.9 C)   Ht 5\' 5"  (1.651 m)   Wt 271 lb 1.6 oz (123 kg)   LMP 11/07/2018 (Exact Date) Comment: currently on period  SpO2 99%   BMI 45.11 kg/m  Wt  Readings from Last 3 Encounters:  11/15/18 271 lb 1.6 oz (123 kg)  11/10/18 277 lb 1.9 oz (125.7 kg)  10/01/18 275 lb 9.6 oz (125 kg)     Health Maintenance Due  Topic Date Due  . PAP SMEAR-Modifier  01/13/2004  . INFLUENZA VACCINE  09/04/2018    There are no preventive care reminders to display for this patient.  Lab Results  Component Value Date   TSH 0.862 09/11/2017   Lab Results  Component Value Date   WBC 9.3 11/07/2018   HGB 10.9 (L) 11/07/2018   HCT 31.7 (L) 11/07/2018   MCV 71.9 (L) 11/07/2018   PLT 183 11/07/2018   Lab Results  Component Value Date   NA 140 11/07/2018   K 4.0 11/07/2018   CO2 25 11/07/2018   GLUCOSE 102 (H) 11/07/2018   BUN 8 11/07/2018   CREATININE 0.71 11/07/2018   BILITOT 1.6 (H) 11/07/2018   ALKPHOS 82 11/07/2018   AST 14 (L) 11/07/2018   ALT 16 11/07/2018   PROT 7.0 11/07/2018   ALBUMIN 3.9 11/07/2018   CALCIUM 9.0 11/07/2018   ANIONGAP 7 11/07/2018   Lab Results  Component Value Date   CHOL 92 (L) 09/11/2017   Lab Results  Component Value Date   HDL 45 09/11/2017   Lab Results  Component Value Date   LDLCALC 36 09/11/2017   Lab Results  Component Value Date   TRIG 53 09/11/2017   Lab Results  Component Value Date   CHOLHDL 2.0 09/11/2017   Lab Results  Component Value Date   HGBA1C 4.1 09/11/2017   Assessment & Plan:   1.  Hospital discharge follow-up  2. Hb-SS disease without crisis [facility](HCC) She is doing well today. She will continue to take pain medications as prescribed; will continue to avoid extreme heat and cold; will continue to eat a healthy diet and drink at least 64 ounces of water daily; continue stool softener as needed; will avoid colds and flu; will continue to get plenty of sleep and rest; will continue to avoid high stressful situations and remain infection free; will continue Folic Acid 1 mg daily to avoid sickle cell crisis. Continue to follow up with Hematologist as needed.  - POCT urinalysis dipstick - folic acid (FOLVITE) 1 MG tablet; Take 1 tablet (1 mg total) by mouth daily.  Dispense: 30 tablet; Refill: 11 - TSH - Lipid Panel - Vitamin B12  3. Chronic, continuous use of opioids - [id number] 11+Oxyco+Alc+Crt-Bund - hydroxyurea (HYDREA) 500 MG capsule; Take 4 capsules (2,000 mg total) by mouth daily. May take with food to minimize GI side effects.  Dispense: 120 capsule; Refill: 5  4. Chronic pain syndrome - hydroxyurea (HYDREA) 500 MG capsule; Take 4 capsules (2,000 mg total) by mouth daily. May take with food to minimize GI side effects.  Dispense: 120 capsule; Refill: 5  5. Anxiety Stable.   6. Medication management  7. Vitamin D deficiency - Vitamin D, Ergocalciferol, (DRISDOL) 1.25 MG (50000 UT) CAPS capsule; Take 1 capsule (50,000 Units total) by mouth every 7 (seven) days.  Dispense: 5 capsule; Refill: 3 - Vitamin D, 25-hydroxy  8. Nausea - prochlorperazine (COMPAZINE) 10 MG tablet; Take 1 tablet (10 mg total) by mouth every 6 (six) hours as needed for nausea or vomiting. Take if Ondansetron is not effective.  Dispense: 30 tablet; Refill: 3  9. Follow up She will follow up in 2 months.    Meds ordered this encounter  Medications  . Vitamin  D, Ergocalciferol, (DRISDOL) 1.25 MG (50000 UT) CAPS capsule    Sig: Take 1 capsule (50,000 Units total) by mouth every 7 (seven) days.     Dispense:  5 capsule    Refill:  3  . prochlorperazine (COMPAZINE) 10 MG tablet    Sig: Take 1 tablet (10 mg total) by mouth every 6 (six) hours as needed for nausea or vomiting. Take if Ondansetron is not effective.    Dispense:  30 tablet    Refill:  3  . hydroxyurea (HYDREA) 500 MG capsule    Sig: Take 4 capsules (2,000 mg total) by mouth daily. May take with food to minimize GI side effects.    Dispense:  120 capsule    Refill:  5  . folic acid (FOLVITE) 1 MG tablet    Sig: Take 1 tablet (1 mg total) by mouth daily.    Dispense:  30 tablet    Refill:  11    Orders Placed This Encounter  Procedures  . [id number] 11+Oxyco+Alc+Crt-Bund  . TSH  . Lipid Panel  . Vitamin B12  . Vitamin D, 25-hydroxy  . POCT urinalysis dipstick    Referral Orders  No referral(s) requested today    [name],  MSN, FNP-BC [facility], [facility] [address] [place], [place] [address] [phone] fax  Problem List Items Addressed This Visit      Other   Chronic pain syndrome   Relevant Medications   hydroxyurea (HYDREA) 500 MG capsule   Chronic, continuous use of opioids   Relevant Medications   hydroxyurea (HYDREA) 500 MG capsule   Other Relevant Orders   [id number] 11+Oxyco+Alc+Crt-Bund   Hb-SS disease without crisis (HCC)   Relevant Medications   folic acid (FOLVITE) 1 MG tablet   Other Relevant Orders   POCT urinalysis dipstick (Completed)   TSH   Lipid Panel   Vitamin B12   Medication management    Other Visit Diagnoses    Hospital discharge follow-up    -  Primary   Anxiety       Vitamin D deficiency       Relevant Medications   Vitamin D, Ergocalciferol, (DRISDOL) 1.25 MG (50000 UT) CAPS capsule   Other Relevant Orders   Vitamin D, 25-hydroxy   Nausea       Relevant Medications   prochlorperazine (COMPAZINE) 10 MG tablet   Follow up          Meds ordered this encounter  Medications  .  Vitamin D, Ergocalciferol, (DRISDOL) 1.25 MG (50000 UT) CAPS capsule    Sig: Take 1 capsule (50,000 Units total) by mouth every 7 (seven) days.    Dispense:  5 capsule    Refill:  3  . prochlorperazine (COMPAZINE) 10 MG tablet    Sig: Take 1 tablet (10 mg total) by mouth every 6 (six) hours as needed for nausea or vomiting. Take if Ondansetron is not effective.    Dispense:  30 tablet    Refill:  3  . hydroxyurea (HYDREA) 500 MG capsule    Sig: Take 4 capsules (2,000 mg total) by mouth daily. May take with food to minimize GI side effects.    Dispense:  120 capsule    Refill:  5  . folic acid (FOLVITE) 1 MG tablet    Sig: Take 1 tablet (1 mg total) by mouth daily.    Dispense:  30 tablet    Refill:  11    Follow-up: No follow-ups on file.    Azzie Glatter, FNP

## 2018-11-15 NOTE — BH Specialist Note (Signed)
Integrated Behavioral Health Note  Session Start time: 9:00   End Time: 10:00 Total Time: 1 hr Type of Service: Behavioral Health - Individual/Family Interpreter: No.   Interpreter Name & Language: none   SUBJECTIVE: Danay Mckellar is a 38 y.o. female brought in by patient.  Pt./Family was referred by NP for:  anxiety, depression and stress precipitating sickle cell crisis. Pt./Family reports the following symptoms/concerns: stress from financial difficulties, depression, anxiety Duration of problem: approx 2 years Severity: moderate Previous treatment: none  OBJECTIVE: Mood: Euthymic & Affect: Appropriate Risk of harm to self or others: none Assessments administered: none  LIFE CONTEXT:  Family & Social:lives independently, co-parents her dependent children with the children's father School/ Work:currently not employed, but exploring a job Acupuncturist, listens to music, watches motivational videos, positive self-talk Life changes:recent break-up with partner What is important to pt/family (values):her children, being independent/strong, working   Engineer, 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 Close relationship with her children and enjoys spending time with them. Has some positive coping skills such as using mindfulness and meditation. Motivated to engage in therapy.  GOALS ADDRESSED: Patient will: 1. Reduce symptoms of: anxiety, depression and stress  2.  Increase knowledge and/or ability of: coping skills and self-management skills  3.  Demonstrate ability to: Increase healthy adjustment to current life circumstances  INTERVENTIONS: Interventions utilized:  Behavioral Activation and Supportive Counseling Standardized Assessments completed: Not Needed  Patient reported stress related to her children possibly going back to in-person learning at school next month; patient reports online learning has been challenging  and is ambivalent about return to school.  Supportive counseling around relationships. Ongoing challenges with healthy relationships. Discussed values work around intimate relationships and encouraged patient to explore what she values in a relationship.  Reported pain has been manageable over the weekend since hospital discharge. Patient continues to use behavioral activation strategies and reports engaging in valued activities over the weekend. Reinforced positive behaviors.   PLAN: 1. F/U with behavioral health clinician: 12/02/18 in clinic 2. Behavioral recommendations: values exploration 3. Referral: none 4. From scale of 1-10, how likely are you to follow plan:   Estanislado Emms, [facility] [phone]

## 2018-11-16 LAB — LIPID PANEL
Chol/HDL Ratio: 2.5 ratio (ref 0.0–4.4)
Cholesterol, Total: 84 mg/dL — ABNORMAL LOW (ref 100–199)
HDL: 33 mg/dL — ABNORMAL LOW (ref >39)
LDL Chol Calc (NIH): 34 mg/dL (ref 0–99)
Triglycerides: 84 mg/dL (ref 0–149)
VLDL Cholesterol Cal: 17 mg/dL (ref 5–40)

## 2018-11-16 LAB — VITAMIN D 25 HYDROXY (VIT D DEFICIENCY, FRACTURES): Vit D, 25-Hydroxy: 15.5 ng/mL — ABNORMAL LOW (ref 30.0–100.0)

## 2018-11-16 LAB — TSH: TSH: 1.12 u[IU]/mL (ref 0.450–4.500)

## 2018-11-16 LAB — VITAMIN B12: Vitamin B-12: 221 pg/mL — ABNORMAL LOW (ref 232–1245)

## 2018-11-19 LAB — DRUG SCREEN 764883 11+OXYCO+ALC+CRT-BUND
Amphetamines, Urine: NEGATIVE ng/mL
BENZODIAZ UR QL: NEGATIVE ng/mL
Barbiturate: NEGATIVE ng/mL
Cannabinoid Quant, Ur: NEGATIVE ng/mL
Cocaine (Metabolite): NEGATIVE ng/mL
Creatinine: 73.2 mg/dL (ref 20.0–300.0)
Ethanol: NEGATIVE %
Meperidine: NEGATIVE ng/mL
Methadone Screen, Urine: NEGATIVE ng/mL
Phencyclidine: NEGATIVE ng/mL
Propoxyphene: NEGATIVE ng/mL
Tramadol: NEGATIVE ng/mL
pH, Urine: 5.8 (ref 4.5–8.9)

## 2018-11-19 LAB — OXYCODONE/OXYMORPHONE, CONFIRM
OXYCODONE/OXYMORPH: POSITIVE — AB
OXYCODONE: >3000 ng/mL
OXYCODONE: POSITIVE — AB
OXYMORPHONE (GC/MS): >3000 ng/mL
OXYMORPHONE: POSITIVE — AB

## 2018-11-19 LAB — OPIATES CONFIRMATION, URINE: Opiates: NEGATIVE ng/mL

## 2018-11-24 ENCOUNTER — Other Ambulatory Visit: Payer: Self-pay | Admitting: Family Medicine

## 2018-11-24 ENCOUNTER — Telehealth: Payer: Self-pay

## 2018-11-24 DIAGNOSIS — G894 Chronic pain syndrome: Secondary | ICD-10-CM

## 2018-11-24 DIAGNOSIS — D571 Sickle-cell disease without crisis: Secondary | ICD-10-CM

## 2018-11-24 DIAGNOSIS — F1190 Opioid use, unspecified, uncomplicated: Secondary | ICD-10-CM

## 2018-11-24 MED ORDER — OXYCODONE HCL 20 MG PO TABS: 20.0000 mg | ORAL_TABLET | ORAL | 0 refills | Status: DC | PRN

## 2018-11-24 NOTE — Telephone Encounter (Signed)
Patient call in for a refill of her Oxycodone 20 mg today. Patient wanted know if she could pick it up on this Friday. Because she will out of it on Friday.   Patient had to increase use in the last 5 days for pain.   Please advise.

## 2018-12-02 ENCOUNTER — Ambulatory Visit: Payer: Medicaid Other | Admitting: Clinical

## 2018-12-05 DIAGNOSIS — E538 Deficiency of other specified B group vitamins: Secondary | ICD-10-CM

## 2018-12-05 HISTORY — DX: Deficiency of other specified B group vitamins: E53.8

## 2018-12-07 ENCOUNTER — Telehealth: Payer: Self-pay | Admitting: Family Medicine

## 2018-12-07 ENCOUNTER — Other Ambulatory Visit: Payer: Self-pay | Admitting: Family Medicine

## 2018-12-07 DIAGNOSIS — D571 Sickle-cell disease without crisis: Secondary | ICD-10-CM

## 2018-12-07 DIAGNOSIS — Z79899 Other long term (current) drug therapy: Secondary | ICD-10-CM

## 2018-12-07 DIAGNOSIS — F1190 Opioid use, unspecified, uncomplicated: Secondary | ICD-10-CM

## 2018-12-07 DIAGNOSIS — G894 Chronic pain syndrome: Secondary | ICD-10-CM

## 2018-12-07 DIAGNOSIS — D5700 Hb-SS disease with crisis, unspecified: Secondary | ICD-10-CM

## 2018-12-07 MED ORDER — OXYCODONE HCL 20 MG PO TABS: 20.0000 mg | ORAL_TABLET | ORAL | 0 refills | Status: DC | PRN

## 2018-12-07 MED ORDER — XTAMPZA ER 13.5 MG PO C12A: 13.5000 mg | EXTENDED_RELEASE_CAPSULE | Freq: Two times a day (BID) | ORAL | 0 refills | Status: DC

## 2018-12-08 NOTE — Telephone Encounter (Signed)
Rx refills sent to pharmacy today. Please inform patient. Thank you.

## 2018-12-14 ENCOUNTER — Other Ambulatory Visit: Payer: Self-pay | Admitting: Family Medicine

## 2018-12-14 DIAGNOSIS — G894 Chronic pain syndrome: Secondary | ICD-10-CM

## 2018-12-14 DIAGNOSIS — Z79899 Other long term (current) drug therapy: Secondary | ICD-10-CM

## 2018-12-14 DIAGNOSIS — D5700 Hb-SS disease with crisis, unspecified: Secondary | ICD-10-CM

## 2018-12-20 ENCOUNTER — Telehealth: Payer: Self-pay

## 2018-12-20 NOTE — Telephone Encounter (Signed)
Patient called and asked that you please call her when you get a chance. Thanks!

## 2018-12-20 NOTE — Telephone Encounter (Signed)
Patient wanted to know what time needed to call to be admitted to the [facility]? Patient was told she should call first thing in the AM/ 8AM.

## 2018-12-21 ENCOUNTER — Encounter: Payer: Self-pay | Admitting: Family Medicine

## 2018-12-21 ENCOUNTER — Inpatient Hospital Stay (HOSPITAL_COMMUNITY)
Admission: AD | Admit: 2018-12-21 | Discharge: 2018-12-24 | DRG: 812 | Disposition: A | Discharge status: Home / Self Care | Payer: Medicaid Other | Source: Ambulatory Visit | Attending: Internal Medicine | Admitting: Internal Medicine

## 2018-12-21 ENCOUNTER — Other Ambulatory Visit: Payer: Self-pay | Admitting: Family Medicine

## 2018-12-21 ENCOUNTER — Telehealth (HOSPITAL_COMMUNITY): Payer: Self-pay | Admitting: General Practice

## 2018-12-21 DIAGNOSIS — Z833 Family history of diabetes mellitus: Secondary | ICD-10-CM | POA: Diagnosis not present

## 2018-12-21 DIAGNOSIS — F329 Major depressive disorder, single episode, unspecified: Secondary | ICD-10-CM | POA: Diagnosis present

## 2018-12-21 DIAGNOSIS — E538 Deficiency of other specified B group vitamins: Secondary | ICD-10-CM

## 2018-12-21 DIAGNOSIS — Z832 Family history of diseases of the blood and blood-forming organs and certain disorders involving the immune mechanism: Secondary | ICD-10-CM

## 2018-12-21 DIAGNOSIS — Z79899 Other long term (current) drug therapy: Secondary | ICD-10-CM | POA: Diagnosis not present

## 2018-12-21 DIAGNOSIS — G894 Chronic pain syndrome: Secondary | ICD-10-CM | POA: Diagnosis present

## 2018-12-21 DIAGNOSIS — Z6841 Body Mass Index (BMI) 40.0 and over, adult: Secondary | ICD-10-CM

## 2018-12-21 DIAGNOSIS — D5700 Hb-SS disease with crisis, unspecified: Secondary | ICD-10-CM | POA: Diagnosis not present

## 2018-12-21 DIAGNOSIS — F419 Anxiety disorder, unspecified: Secondary | ICD-10-CM | POA: Diagnosis present

## 2018-12-21 DIAGNOSIS — Z20828 Contact with and (suspected) exposure to other viral communicable diseases: Secondary | ICD-10-CM | POA: Diagnosis present

## 2018-12-21 DIAGNOSIS — Z8249 Family history of ischemic heart disease and other diseases of the circulatory system: Secondary | ICD-10-CM | POA: Diagnosis not present

## 2018-12-21 DIAGNOSIS — E559 Vitamin D deficiency, unspecified: Secondary | ICD-10-CM | POA: Diagnosis present

## 2018-12-21 DIAGNOSIS — F1120 Opioid dependence, uncomplicated: Secondary | ICD-10-CM | POA: Diagnosis present

## 2018-12-21 LAB — URINALYSIS, COMPLETE (UACMP) WITH MICROSCOPIC
Bacteria, UA: NONE SEEN
Bilirubin Urine: NEGATIVE
Glucose, UA: NEGATIVE mg/dL
Hgb urine dipstick: NEGATIVE
Ketones, ur: NEGATIVE mg/dL
Leukocytes,Ua: NEGATIVE
Nitrite: NEGATIVE
Protein, ur: NEGATIVE mg/dL
Specific Gravity, Urine: 1.013 (ref 1.005–1.030)
pH: 5 (ref 5.0–8.0)

## 2018-12-21 LAB — COMPREHENSIVE METABOLIC PANEL
ALT: 25 U/L (ref 0–44)
AST: 21 U/L (ref 15–41)
Albumin: 4.2 g/dL (ref 3.5–5.0)
Alkaline Phosphatase: 74 U/L (ref 38–126)
Anion gap: 9 (ref 5–15)
BUN: 8 mg/dL (ref 6–20)
CO2: 23 mmol/L (ref 22–32)
Calcium: 9.3 mg/dL (ref 8.9–10.3)
Chloride: 108 mmol/L (ref 98–111)
Creatinine, Ser: 0.64 mg/dL (ref 0.44–1.00)
GFR calc Af Amer: >60 mL/min (ref >60)
GFR calc non Af Amer: >60 mL/min (ref >60)
Glucose, Bld: 99 mg/dL (ref 70–99)
Potassium: 4.2 mmol/L (ref 3.5–5.1)
Sodium: 140 mmol/L (ref 135–145)
Total Bilirubin: 1.3 mg/dL — ABNORMAL HIGH (ref 0.3–1.2)
Total Protein: 7.6 g/dL (ref 6.5–8.1)

## 2018-12-21 LAB — PREGNANCY, URINE: Preg Test, Ur: NEGATIVE

## 2018-12-21 LAB — RETICULOCYTES
Immature Retic Fract: 35.3 % — ABNORMAL HIGH (ref 2.3–15.9)
RBC.: 4.67 MIL/uL (ref 3.87–5.11)
Retic Count, Absolute: 266.7 10*3/uL — ABNORMAL HIGH (ref 19.0–186.0)
Retic Ct Pct: 5.7 % — ABNORMAL HIGH (ref 0.4–3.1)

## 2018-12-21 LAB — CBC WITH DIFFERENTIAL/PLATELET
Abs Immature Granulocytes: 0.02 10*3/uL (ref 0.00–0.07)
Basophils Absolute: 0 10*3/uL (ref 0.0–0.1)
Basophils Relative: 0 %
Eosinophils Absolute: 0.2 10*3/uL (ref 0.0–0.5)
Eosinophils Relative: 2 %
HCT: 35.2 % — ABNORMAL LOW (ref 36.0–46.0)
Hemoglobin: 11.9 g/dL — ABNORMAL LOW (ref 12.0–15.0)
Immature Granulocytes: 0 %
Lymphocytes Relative: 22 %
Lymphs Abs: 1.8 10*3/uL (ref 0.7–4.0)
MCH: 25.5 pg — ABNORMAL LOW (ref 26.0–34.0)
MCHC: 33.8 g/dL (ref 30.0–36.0)
MCV: 75.4 fL — ABNORMAL LOW (ref 80.0–100.0)
Monocytes Absolute: 0.7 10*3/uL (ref 0.1–1.0)
Monocytes Relative: 8 %
Neutro Abs: 5.4 10*3/uL (ref 1.7–7.7)
Neutrophils Relative %: 68 %
Platelets: 254 10*3/uL (ref 150–400)
RBC: 4.67 MIL/uL (ref 3.87–5.11)
RDW: 14.8 % (ref 11.5–15.5)
WBC: 8.1 10*3/uL (ref 4.0–10.5)
nRBC: 0.9 % — ABNORMAL HIGH (ref 0.0–0.2)

## 2018-12-21 MED ORDER — FOLIC ACID 1 MG PO TABS
1.0000 mg | ORAL_TABLET | Freq: Every day | ORAL | Status: DC
  Administered 2018-12-21 – 2018-12-24 (×4): 1 mg via ORAL
  Filled 2018-12-21 (×4): qty 1

## 2018-12-21 MED ORDER — VITAMIN D (ERGOCALCIFEROL) 1.25 MG (50000 UNIT) PO CAPS: 50000.0000 [IU] | ORAL_CAPSULE | ORAL | 6 refills | Status: DC

## 2018-12-21 MED ORDER — VOXELOTOR 500 MG PO TABS: 1500.0000 mg | ORAL_TABLET | Freq: Every day | ORAL | Status: DC

## 2018-12-21 MED ORDER — ACETAMINOPHEN 500 MG PO TABS
1000.0000 mg | ORAL_TABLET | Freq: Once | ORAL | Status: AC
  Administered 2018-12-21: 1000 mg via ORAL
  Filled 2018-12-21: qty 2

## 2018-12-21 MED ORDER — SENNOSIDES-DOCUSATE SODIUM 8.6-50 MG PO TABS
1.0000 | ORAL_TABLET | Freq: Two times a day (BID) | ORAL | Status: DC
  Administered 2018-12-22 – 2018-12-24 (×5): 1 via ORAL
  Filled 2018-12-21 (×6): qty 1

## 2018-12-21 MED ORDER — HYDROMORPHONE 1 MG/ML IV SOLN
INTRAVENOUS | Status: DC
  Administered 2018-12-21: 0.6 mg via INTRAVENOUS
  Administered 2018-12-21 (×2): 30 mg via INTRAVENOUS
  Administered 2018-12-21: 20.2 mg via INTRAVENOUS
  Administered 2018-12-22: 11.76 mg via INTRAVENOUS
  Administered 2018-12-22: 1.8 mg via INTRAVENOUS
  Administered 2018-12-22: 30 mg via INTRAVENOUS
  Administered 2018-12-22: 16.55 mg via INTRAVENOUS
  Filled 2018-12-21 (×3): qty 30

## 2018-12-21 MED ORDER — VITAMIN B-12 50 MCG PO TABS: 50.0000 ug | ORAL_TABLET | Freq: Every day | ORAL | 6 refills | Status: DC

## 2018-12-21 MED ORDER — SODIUM CHLORIDE 0.9 % IV SOLN
25.0000 mg | INTRAVENOUS | Status: DC | PRN
  Filled 2018-12-21: qty 0.5

## 2018-12-21 MED ORDER — HYDROXYUREA 500 MG PO CAPS
2000.0000 mg | ORAL_CAPSULE | Freq: Every day | ORAL | Status: DC
  Administered 2018-12-22 – 2018-12-24 (×3): 2000 mg via ORAL
  Filled 2018-12-21 (×3): qty 4

## 2018-12-21 MED ORDER — ONDANSETRON HCL 4 MG/2ML IJ SOLN
4.0000 mg | Freq: Four times a day (QID) | INTRAMUSCULAR | Status: DC | PRN
  Administered 2018-12-21 – 2018-12-22 (×2): 4 mg via INTRAVENOUS
  Filled 2018-12-21 (×2): qty 2

## 2018-12-21 MED ORDER — ENOXAPARIN SODIUM 40 MG/0.4ML ~~LOC~~ SOLN
40.0000 mg | SUBCUTANEOUS | Status: DC
  Administered 2018-12-21 – 2018-12-22 (×2): 40 mg via SUBCUTANEOUS
  Filled 2018-12-21 (×3): qty 0.4

## 2018-12-21 MED ORDER — KETOROLAC TROMETHAMINE 30 MG/ML IJ SOLN
15.0000 mg | Freq: Once | INTRAMUSCULAR | Status: AC
  Administered 2018-12-22: 15 mg via INTRAVENOUS
  Filled 2018-12-21 (×2): qty 1

## 2018-12-21 MED ORDER — DIPHENHYDRAMINE HCL 25 MG PO CAPS
25.0000 mg | ORAL_CAPSULE | ORAL | Status: DC | PRN
  Administered 2018-12-21: 25 mg via ORAL
  Filled 2018-12-21: qty 1

## 2018-12-21 MED ORDER — DULOXETINE HCL 20 MG PO CPEP
20.0000 mg | ORAL_CAPSULE | Freq: Every day | ORAL | Status: DC
  Administered 2018-12-21 – 2018-12-24 (×4): 20 mg via ORAL
  Filled 2018-12-21 (×4): qty 1

## 2018-12-21 MED ORDER — SODIUM CHLORIDE 0.45 % IV SOLN
INTRAVENOUS | Status: DC
  Administered 2018-12-21 – 2018-12-23 (×2): via INTRAVENOUS

## 2018-12-21 MED ORDER — TIZANIDINE HCL 4 MG PO TABS
4.0000 mg | ORAL_TABLET | Freq: Three times a day (TID) | ORAL | Status: DC
  Administered 2018-12-21 – 2018-12-24 (×8): 4 mg via ORAL
  Filled 2018-12-21 (×2): qty 1
  Filled 2018-12-21: qty 2
  Filled 2018-12-21: qty 1
  Filled 2018-12-21 (×3): qty 2
  Filled 2018-12-21 (×3): qty 1

## 2018-12-21 MED ORDER — NALOXONE HCL 0.4 MG/ML IJ SOLN: 0.4000 mg | INTRAMUSCULAR | Status: DC | PRN

## 2018-12-21 MED ORDER — KETOROLAC TROMETHAMINE 15 MG/ML IJ SOLN
15.0000 mg | Freq: Four times a day (QID) | INTRAMUSCULAR | Status: DC
  Administered 2018-12-21 – 2018-12-24 (×12): 15 mg via INTRAVENOUS
  Filled 2018-12-21 (×12): qty 1

## 2018-12-21 MED ORDER — OXYCODONE HCL ER 10 MG PO T12A
10.0000 mg | EXTENDED_RELEASE_TABLET | Freq: Two times a day (BID) | ORAL | Status: DC
  Administered 2018-12-21 – 2018-12-24 (×6): 10 mg via ORAL
  Filled 2018-12-21 (×7): qty 1

## 2018-12-21 MED ORDER — POLYETHYLENE GLYCOL 3350 17 G PO PACK: 17.0000 g | PACK | Freq: Every day | ORAL | Status: DC | PRN

## 2018-12-21 MED ORDER — SODIUM CHLORIDE 0.9% FLUSH: 9.0000 mL | INTRAVENOUS | Status: DC | PRN

## 2018-12-21 NOTE — Telephone Encounter (Signed)
Patient called, complained of pain in the right leg and right arm  rated at 9/10. Denied chest pain, fever, diarrhea, abdominal pain, nausea/vomitting. Screened negative for Covid-19 symptoms. Admitted to having means of transportation without driving self after treatment. Last took  20 mg of Oxycodone at 04:00 am today. Per provider, patient can come to the day hospital for treatment. Patient notified, verbalized understanding.

## 2018-12-21 NOTE — Discharge Instructions (Signed)
Sickle Cell Anemia, Adult ° °Sickle cell anemia is a condition where your red blood cells are shaped like sickles. Red blood cells carry oxygen through the body. Sickle-shaped cells do not live as long as normal red blood cells. They also clump together and block blood from flowing through the blood vessels. This prevents the body from getting enough oxygen. Sickle cell anemia causes organ damage and pain. It also increases the risk of infection. °Follow these instructions at home: °Medicines °· Take over-the-counter and prescription medicines only as told by your doctor. °· If you were prescribed an antibiotic medicine, take it as told by your doctor. Do not stop taking the antibiotic even if you start to feel better. °· If you develop a fever, do not take medicines to lower the fever right away. Tell your doctor about the fever. °Managing pain, stiffness, and swelling °· Try these methods to help with pain: °? Use a heating pad. °? Take a warm bath. °? Distract yourself, such as by watching TV. °Eating and drinking °· Drink enough fluid to keep your pee (urine) clear or pale yellow. Drink more in hot weather and during exercise. °· Limit or avoid alcohol. °· Eat a healthy diet. Eat plenty of fruits, vegetables, whole grains, and lean protein. °· Take vitamins and supplements as told by your doctor. °Traveling °· When traveling, keep these with you: °? Your medical information. °? The names of your doctors. °? Your medicines. °· If you need to take an airplane, talk to your doctor first. °Activity °· Rest often. °· Avoid exercises that make your heart beat much faster, such as jogging. °General instructions °· Do not use products that have nicotine or tobacco, such as cigarettes and e-cigarettes. If you need help quitting, ask your doctor. °· Consider wearing a medical alert bracelet. °· Avoid being in high places (high altitudes), such as mountains. °· Avoid very hot or cold temperatures. °· Avoid places where the  temperature changes a lot. °· Keep all follow-up visits as told by your doctor. This is important. °Contact a doctor if: °· A joint hurts. °· Your feet or hands hurt or swell. °· You feel tired (fatigued). °Get help right away if: °· You have symptoms of infection. These include: °? Fever. °? Chills. °? Being very tired. °? Irritability. °? Poor eating. °? Throwing up (vomiting). °· You feel dizzy or faint. °· You have new stomach pain, especially on the left side. °· You have a an erection (priapism) that lasts more than 4 hours. °· You have numbness in your arms or legs. °· You have a hard time moving your arms or legs. °· You have trouble talking. °· You have pain that does not go away when you take medicine. °· You are short of breath. °· You are breathing fast. °· You have a long-term cough. °· You have pain in your chest. °· You have a bad headache. °· You have a stiff neck. °· Your stomach looks bloated even though you did not eat much. °· Your skin is pale. °· You suddenly cannot see well. °Summary °· Sickle cell anemia is a condition where your red blood cells are shaped like sickles. °· Follow your doctor's advice on ways to manage pain, food to eat, activities to do, and steps to take for safe travel. °· Get medical help right away if you have any signs of infection, such as a fever. °This information is not intended to replace advice given to you by   your health care provider. Make sure you discuss any questions you have with your health care provider. °Document Released: 11/10/2012 Document Revised: 05/14/2018 Document Reviewed: 02/26/2016 °Elsevier Patient Education © 2020 [employer] Inc. ° °

## 2018-12-21 NOTE — H&P (Signed)
[facility] History and Physical   Date: 12/21/2018  Patient name: Stephanie Burch Medical record number: [id number] Date of birth: October 05, 1980 Age: 38 y.o. Gender: female PCP: Kallie Locks, FNP  Attending physician: Quentin Angst, MD  Chief Complaint: Sickle cell pain  History of Present Illness: Lalita Ebel, a 38 year old female with a medical history significant for sickle cell disease, chronic pain syndrome, opiate dependence and tolerance, morbid obesity, history of depression and anemia of chronic disease presents complaining of pain to right upper and lower extremities that is consistent with her typical sickle cell pain crisis.  Patient states that pain intensity has been elevated over the past week and has been uncontrolled by home medications.  Patient states that she has been taking more medications than prescribed to alleviate pain without success.  Current pain intensity is 9/10 characterized as constant and throbbing.  Patient denies headache, dizziness, paresthesias, shortness of breath, chest pain, dysuria, nausea, vomiting, or diarrhea.  No sick contacts, recent travel, or exposure to COVID-19.  Meds: Medications Prior to Admission  Medication Sig Dispense Refill Last Dose  . DULoxetine (CYMBALTA) 20 MG capsule TAKE 1 CAPSULE BY MOUTH EVERY DAY 90 capsule 1 Past Week at Unknown time  . folic acid (FOLVITE) 1 MG tablet Take 1 tablet (1 mg total) by mouth daily. 30 tablet 11 Past Week at Unknown time  . ibuprofen (ADVIL) 800 MG tablet Take 1 tablet (800 mg total) by mouth every 8 (eight) hours as needed for headache, mild pain, moderate pain or cramping. 30 tablet 3 12/21/2018 at Unknown time  . oxyCODONE ER (XTAMPZA ER) 13.5 MG C12A Take 13.5 mg by mouth every 12 (twelve) hours. 60 capsule 0 12/21/2018 at Unknown time  . Oxycodone HCl 20 MG TABS Take 1 tablet (20 mg total) by mouth every 4 (four) hours as needed (for pain). 90 tablet 0 12/21/2018 at  Unknown time  . tiZANidine (ZANAFLEX) 4 MG capsule TAKE 1 CAPSULE (4 MG TOTAL) BY MOUTH 3 (THREE) TIMES DAILY. 90 capsule 1 12/21/2018 at Unknown time  . voxelotor (OXBRYTA) 500 MG TABS tablet Take 1,500 mg by mouth daily. For Sickle Cell Anemia   Past Week at Unknown time  . docusate sodium (COLACE) 100 MG capsule Take 1 capsule (100 mg total) by mouth 2 (two) times daily. 10 capsule 0 Unknown at Unknown time  . hydroxyurea (HYDREA) 500 MG capsule Take 4 capsules (2,000 mg total) by mouth daily. May take with food to minimize GI side effects. 120 capsule 5   . naloxone (NARCAN) 0.4 MG/ML injection As needed 1 mL 0   . ondansetron (ZOFRAN) 4 MG tablet Take 1 tablet (4 mg total) by mouth every 8 (eight) hours as needed for nausea or vomiting. 30 tablet 2   . prochlorperazine (COMPAZINE) 10 MG tablet Take 1 tablet (10 mg total) by mouth every 6 (six) hours as needed for nausea or vomiting. Take if Ondansetron is not effective. 30 tablet 3     Allergies: Patient has no known allergies. Past Medical History:  Diagnosis Date  . Nausea   . Sickle cell anemia (HCC)   . Vitamin B12 deficiency 12/2018  . Vitamin D deficiency    No past surgical history on file. Family History  Problem Relation Age of Onset  . Hypertension Mother   . Sickle cell trait Mother   . Diabetes Father   . Sickle cell trait Father    Social History   Socioeconomic History  .  Marital status: Single    Spouse name: Not on file  . Number of children: Not on file  . Years of education: Not on file  . Highest education level: Not on file  Occupational History  . Not on file  Social Needs  . Financial resource strain: Not on file  . Food insecurity    Worry: Not on file    Inability: Not on file  . Transportation needs    Medical: Not on file    Non-medical: Not on file  Tobacco Use  . Smoking status: Never Smoker  . Smokeless tobacco: Never Used  Substance and Sexual Activity  . Alcohol use: Never     Frequency: Never  . Drug use: Never  . Sexual activity: Yes    Birth control/protection: None  Lifestyle  . Physical activity    Days per week: Not on file    Minutes per session: Not on file  . Stress: Not on file  Relationships  . Social Musicianconnections    Talks on phone: Not on file    Gets together: Not on file    Attends religious service: Not on file    Active member of club or organization: Not on file    Attends meetings of clubs or organizations: Not on file    Relationship status: Not on file  . Intimate partner violence    Fear of current or ex partner: Not on file    Emotionally abused: Not on file    Physically abused: Not on file    Forced sexual activity: Not on file  Other Topics Concern  . Not on file  Social History Narrative  . Not on file    Review of Systems: Review of Systems  Constitutional: Negative for chills and fever.  HENT: Negative.   Eyes: Negative.   Respiratory: Negative.   Cardiovascular: Negative.  Negative for chest pain and palpitations.  Gastrointestinal: Negative.   Genitourinary: Negative.   Musculoskeletal: Positive for joint pain (Right upper and lower extremity pain).  Skin: Negative.   Neurological: Negative.   Psychiatric/Behavioral: Negative.     Physical Exam: Blood pressure 125/66, pulse 100, temperature 98.3 F (36.8 C), temperature source Oral, resp. rate 18, height 5\' 5"  (1.651 m), weight 120.1 kg, last menstrual period 12/08/2018, SpO2 99 %. Physical Exam Constitutional:      General: She is not in acute distress.    Appearance: She is obese.  HENT:     Nose: Nose normal.     Mouth/Throat:     Mouth: Mucous membranes are moist.  Cardiovascular:     Rate and Rhythm: Normal rate and regular rhythm.     Pulses: Normal pulses.     Heart sounds: No murmur.  Pulmonary:     Effort: Pulmonary effort is normal.     Breath sounds: Normal breath sounds.  Abdominal:     General: Bowel sounds are normal.     Palpations:  Abdomen is soft.  Musculoskeletal: Normal range of motion.  Skin:    General: Skin is warm.  Neurological:     General: No focal deficit present.     Mental Status: She is oriented to person, place, and time. Mental status is at baseline.  Psychiatric:        Mood and Affect: Mood normal.        Behavior: Behavior normal.        Thought Content: Thought content normal.        Judgment:  Judgment normal.     Lab results: Results for orders placed or performed during the hospital encounter of 12/21/18 (from the past 24 hour(s))  Comprehensive metabolic panel     Status: Abnormal   Collection Time: 12/21/18  9:54 AM  Result Value Ref Range   Sodium 140 135 - 145 mmol/L   Potassium 4.2 3.5 - 5.1 mmol/L   Chloride 108 98 - 111 mmol/L   CO2 23 22 - 32 mmol/L   Glucose, Bld 99 70 - 99 mg/dL   BUN 8 6 - 20 mg/dL   Creatinine, Ser 0.64 0.44 - 1.00 mg/dL   Calcium 9.3 8.9 - 10.3 mg/dL   Total Protein 7.6 6.5 - 8.1 g/dL   Albumin 4.2 3.5 - 5.0 g/dL   AST 21 15 - 41 U/L   ALT 25 0 - 44 U/L   Alkaline Phosphatase 74 38 - 126 U/L   Total Bilirubin 1.3 (H) 0.3 - 1.2 mg/dL   GFR calc non Af Amer >60 >60 mL/min   GFR calc Af Amer >60 >60 mL/min   Anion gap 9 5 - 15  CBC with Differential/Platelet     Status: Abnormal   Collection Time: 12/21/18  9:54 AM  Result Value Ref Range   WBC 8.1 4.0 - 10.5 K/uL   RBC 4.67 3.87 - 5.11 MIL/uL   Hemoglobin 11.9 (L) 12.0 - 15.0 g/dL   HCT 35.2 (L) 36.0 - 46.0 %   MCV 75.4 (L) 80.0 - 100.0 fL   MCH 25.5 (L) 26.0 - 34.0 pg   MCHC 33.8 30.0 - 36.0 g/dL   RDW 14.8 11.5 - 15.5 %   Platelets 254 150 - 400 K/uL   nRBC 0.9 (H) 0.0 - 0.2 %   Neutrophils Relative % 68 %   Neutro Abs 5.4 1.7 - 7.7 K/uL   Lymphocytes Relative 22 %   Lymphs Abs 1.8 0.7 - 4.0 K/uL   Monocytes Relative 8 %   Monocytes Absolute 0.7 0.1 - 1.0 K/uL   Eosinophils Relative 2 %   Eosinophils Absolute 0.2 0.0 - 0.5 K/uL   Basophils Relative 0 %   Basophils Absolute 0.0 0.0 -  0.1 K/uL   Immature Granulocytes 0 %   Abs Immature Granulocytes 0.02 0.00 - 0.07 K/uL  Reticulocytes     Status: Abnormal   Collection Time: 12/21/18  9:54 AM  Result Value Ref Range   Retic Ct Pct 5.7 (H) 0.4 - 3.1 %   RBC. 4.67 3.87 - 5.11 MIL/uL   Retic Count, Absolute 266.7 (H) 19.0 - 186.0 K/uL   Immature Retic Fract 35.3 (H) 2.3 - 15.9 %  Pregnancy, urine     Status: None   Collection Time: 12/21/18  9:54 AM  Result Value Ref Range   Preg Test, Ur NEGATIVE NEGATIVE  Urinalysis, Complete w Microscopic     Status: None   Collection Time: 12/21/18  9:54 AM  Result Value Ref Range   Color, Urine YELLOW YELLOW   APPearance CLEAR CLEAR   Specific Gravity, Urine 1.013 1.005 - 1.030   pH 5.0 5.0 - 8.0   Glucose, UA NEGATIVE NEGATIVE mg/dL   Hgb urine dipstick NEGATIVE NEGATIVE   Bilirubin Urine NEGATIVE NEGATIVE   Ketones, ur NEGATIVE NEGATIVE mg/dL   Protein, ur NEGATIVE NEGATIVE mg/dL   Nitrite NEGATIVE NEGATIVE   Leukocytes,Ua NEGATIVE NEGATIVE   RBC / HPF 0-5 0 - 5 RBC/hpf   WBC, UA 0-5 0 - 5 WBC/hpf  Bacteria, UA NONE SEEN NONE SEEN   Squamous Epithelial / LPF 0-5 0 - 5    Imaging results:  No results found.   Assessment & Plan: Patient admitted to sickle cell day infusion center for management of pain crisis.  She is opiate tolerant. Initiate IV Dilaudid PCA with settings of 0.6 mg, 10-minute lockout, and 4 mg/h. Toradol 15 mg IV x1 Tylenol 1000 mg by mouth x1 IV fluids, 0.45% saline at 100 mL/h Review CBC with differential, urine pregnancy, complete metabolic panel, and reticulocytes as results become available Pain intensity will be reevaluated in context of functioning in relationship to baseline as her care progresses. The pain intensity remains elevated or new problems develop, transition to inpatient services for higher level of care.   Nolon Nations  APRN, MSN, FNP-C Patient Care [facility] [address] [place], [place] [address] [phone]  12/21/2018, 8:32 PM   This note was prepared using Dragon speech recognition software, errors in dictation are unintentional.

## 2018-12-21 NOTE — Progress Notes (Signed)
Patient admitted to the day infusion hospital for sickle cell pain crisis. Initially, patient reported right arm and leg pain rated 9/10. For pain management, patient placed on Dilaudid PCA, given 1000 mg Tylenol and hydrated with IV fluids. Patient's pain remained elevated at 8/10. Patient is also out of home pain medications and will not be able to fill them until Thursday. [place], [place] notified. Order written for admission to inpatient unit. Report called to Adline Peals, RN on [address]. Patient transferred to [place] in wheelchair. Vital signs wnl. Patient alert, oriented and stable at transfer.

## 2018-12-21 NOTE — H&P (Signed)
H&P  Patient Demographics:  Stephanie Burch, is a 38 y.o. female  MRN: [id number]   DOB - 25-Apr-1980  Admit Date - 12/21/2018  Outpatient Primary MD for the patient is Kallie LocksStroud, [name], FNP     HPI:   Stephanie Burch  is a 38 y.o. female with a medical history significant for sickle cell disease, chronic pain syndrome, opiate tolerance, and opiate dependence, morbid obesity, and history of anemia of chronic disease presented to sickle cell day infusion center, lining of right upper and lower extremity pain over the past week.  Patient states that she has been taking more medication than prescribed to alleviate current pain crisis without success.  She says that she ran out of medication around 4 AM.  Pain intensity is 9/10 characterized as constant and throbbing.  She attributes current pain crisis to changes in weather and increased stressors at home.  Patient denies headache, shortness of breath, chest pain, persistent cough, dysuria, nausea, vomiting, or diarrhea.  No sick contacts, recent travel, or exposure to COVID-19.  Sickle cell day infusion center course: Hemoglobin 11.9, which is consistent with patient's baseline.  All other laboratory values reassuring.  BP 122/70, P105, RR 15, and oxygen saturation 98% on RA.  Pain persists despite IV Dilaudid PCA, IV Toradol, IV fluids, and Tylenol.  [facility] for management of pain crisis.   Review of systems:  In addition to the HPI above, patient reports Review of Systems  Constitutional: Negative for chills, fever and weight loss.  HENT: Negative.   Eyes: Negative.   Cardiovascular: Negative for chest pain, palpitations and leg swelling.  Gastrointestinal: Negative.   Genitourinary: Negative.   Musculoskeletal: Positive for joint pain.       Pain to right upper and lower extremities  Skin: Negative.   Neurological: Negative.   Endo/Heme/Allergies: Negative.   Psychiatric/Behavioral: Negative.    A  full 10 point Review of Systems was done, except as stated above, all other Review of Systems were negative.  With Past History of the following :   Past Medical History:  Diagnosis Date  . Nausea   . Sickle cell anemia (HCC)   . Vitamin D deficiency       No past surgical history on file.   Social History:   Social History   Tobacco Use  . Smoking status: Never Smoker  . Smokeless tobacco: Never Used  Substance Use Topics  . Alcohol use: Never    Frequency: Never     Lives - At home   Family History :   Family History  Problem Relation Age of Onset  . Hypertension Mother   . Sickle cell trait Mother   . Diabetes Father   . Sickle cell trait Father      Home Medications:   Prior to Admission medications   Medication Sig Start Date End Date Taking? Authorizing Provider  DULoxetine (CYMBALTA) 20 MG capsule TAKE 1 CAPSULE BY MOUTH EVERY DAY 10/25/18  Yes Kallie LocksStroud, [name], FNP  folic acid (FOLVITE) 1 MG tablet Take 1 tablet (1 mg total) by mouth daily. 11/15/18  Yes Kallie LocksStroud, [name], FNP  ibuprofen (ADVIL) 800 MG tablet Take 1 tablet (800 mg total) by mouth every 8 (eight) hours as needed for headache, mild pain, moderate pain or cramping. 09/17/18  Yes Kallie LocksStroud, [name], FNP  oxyCODONE ER [facility](XTAMPZA ER) 13.5 MG C12A Take 13.5 mg by mouth every 12 (twelve) hours. 12/09/18 01/08/19 Yes Bradly ChrisStroud,  Rolm Gala, FNP  Oxycodone HCl 20 MG TABS Take 1 tablet (20 mg total) by mouth every 4 (four) hours as needed (for pain). 12/10/18  Yes Kallie Locks, FNP  tiZANidine (ZANAFLEX) 4 MG capsule TAKE 1 CAPSULE (4 MG TOTAL) BY MOUTH 3 (THREE) TIMES DAILY. 12/14/18  Yes Kallie Locks, FNP  voxelotor (OXBRYTA) 500 MG TABS tablet Take 1,500 mg by mouth daily. For Sickle Cell Anemia   Yes [provider]  docusate sodium (COLACE) 100 MG capsule Take 1 capsule (100 mg total) by mouth 2 (two) times daily. 09/10/18   Kallie Locks, FNP  hydroxyurea (HYDREA) 500 MG capsule Take 4  capsules (2,000 mg total) by mouth daily. May take with food to minimize GI side effects. 11/15/18   Kallie Locks, FNP  naloxone [facility]) 0.4 MG/ML injection As needed 09/14/18   Kallie Locks, FNP  ondansetron (ZOFRAN) 4 MG tablet Take 1 tablet (4 mg total) by mouth every 8 (eight) hours as needed for nausea or vomiting. 10/08/18   Kallie Locks, FNP  prochlorperazine (COMPAZINE) 10 MG tablet Take 1 tablet (10 mg total) by mouth every 6 (six) hours as needed for nausea or vomiting. Take if Ondansetron is not effective. 11/15/18   Kallie Locks, FNP  Vitamin D, Ergocalciferol, (DRISDOL) 1.25 MG (50000 UT) CAPS capsule Take 1 capsule (50,000 Units total) by mouth every 7 (seven) days. 11/15/18   Kallie Locks, FNP     Allergies:   No Known Allergies   Physical Exam:   Vitals:   Vitals:   12/21/18 1239 12/21/18 1440  BP: (!) 151/43 122/70  Pulse: (!) 106 (!) 105  Resp: 16 15  Temp:    SpO2: 96% 98%    Physical Exam: Constitutional: Patient appears well-developed and well-nourished. Not in obvious distress.  Morbid obesity HENT: Normocephalic, atraumatic, External right and left ear normal. Oropharynx is clear and moist.  Eyes: Conjunctivae and EOM are normal. PERRLA, no scleral icterus. Neck: Normal ROM. Neck supple. No JVD. No tracheal deviation. No thyromegaly. CVS: RRR, S1/S2 +, no murmurs, no gallops, no carotid bruit.  Pulmonary: Effort and breath sounds normal, no stridor, rhonchi, wheezes, rales.  Abdominal: Soft. BS +, no distension, tenderness, rebound or guarding.  Musculoskeletal: Normal range of motion. No edema and no tenderness.  Lymphadenopathy: No lymphadenopathy noted, cervical, inguinal or axillary Neuro: Alert. Normal reflexes, muscle tone coordination. No cranial nerve deficit. Skin: Skin is warm and dry. No rash noted. Not diaphoretic. No erythema. No pallor. Psychiatric: Normal mood and affect. Behavior, judgment, thought content normal.    Data Review:   CBC Recent Labs  Lab 12/21/18 0954  WBC 8.1  HGB 11.9*  HCT 35.2*  PLT 254  MCV 75.4*  MCH 25.5*  MCHC 33.8  RDW 14.8  LYMPHSABS 1.8  MONOABS 0.7  EOSABS 0.2  BASOSABS 0.0   ------------------------------------------------------------------------------------------------------------------  Chemistries  Recent Labs  Lab 12/21/18 0954  NA 140  K 4.2  CL 108  CO2 23  GLUCOSE 99  BUN 8  CREATININE 0.64  CALCIUM 9.3  AST 21  ALT 25  ALKPHOS 74  BILITOT 1.3*   ------------------------------------------------------------------------------------------------------------------ CrCl cannot be calculated (Unknown ideal weight.). ------------------------------------------------------------------------------------------------------------------ No results for input(s): TSH, T4TOTAL, T3FREE, THYROIDAB in the last 72 hours.  Invalid input(s): FREET3  Coagulation profile No results for input(s): INR, PROTIME in the last 168 hours. ------------------------------------------------------------------------------------------------------------------- No results for input(s): DDIMER in the last 72 hours. -------------------------------------------------------------------------------------------------------------------  Cardiac Enzymes  No results for input(s): CKMB, TROPONINI, MYOGLOBIN in the last 168 hours.  Invalid input(s): CK ------------------------------------------------------------------------------------------------------------------ No results found for: BNP  ---------------------------------------------------------------------------------------------------------------  Urinalysis    Component Value Date/Time   COLORURINE YELLOW 12/21/2018 [place] 12/21/2018 0954   LABSPEC 1.013 12/21/2018 0954   PHURINE 5.0 12/21/2018 [place] 12/21/2018 0954   HGBUR NEGATIVE 12/21/2018 [place] 12/21/2018 0954    BILIRUBINUR Negative 11/15/2018 [place] 12/21/2018 0954   PROTEINUR NEGATIVE 12/21/2018 0954   UROBILINOGEN 1.0 11/15/2018 0834   NITRITE NEGATIVE 12/21/2018 0954   LEUKOCYTESUR NEGATIVE 12/21/2018 0954    ----------------------------------------------------------------------------------------------------------------   Imaging Results:    No results found.    Assessment & Plan:  Active Problems:   Sickle cell pain crisis (HCC)   Chronic pain syndrome   Anxiety  Sickle cell disease with pain crisis: Admit, continue 0.45% saline at 100 mL/h IV Dilaudid PCA per weight-based protocol.  Settings of 0.6 mg, 10-minute lockout, and 4 mg/h Toradol 15 mg IV every 6 hours for total of 5 days Reevaluate pain scale regularly Monitor vital signs closely Pain intensity will be reevaluated in context of functioning in relationship to baseline as her care progresses Maintain oxygen saturation above 90%, 2 L supplemental oxygen as needed  Chronic pain syndrome: OxyContin 10 mg every 12 hours Hold oxycodone, use PCA Dilaudid as substitute  Sickle cell anemia: Hemoglobin 11.9, consistent with patient's baseline.  No clinical indication for blood transfusion at this time.  Continue to monitor closely.  Morbid obesity: Patient counseled on diet consistently.  Patient states that she typically eats 1-2 meals per day.  Also, she has very sedentary lifestyle and does not exercise.  Recommend heart healthy diet.  History of depression: Continue Cymbalta.  No suicidal or homicidal ideations at present.  Continue to monitor closely.   DVT Prophylaxis: Subcut Lovenox   AM Labs Ordered, also please review Full Orders  Family Communication: Admission, patient's condition and plan of care including tests being ordered have been discussed with the patient who indicate understanding and agree with the plan and Code Status.  Code Status: Full Code  Consults called: None     Admission status: Inpatient    Time spent in minutes : 50 minutes  [place], MSN, FNP-C Patient [place] [address] [place], [place] [address] [phone]  12/21/2018 at 4:02 PM   This note was prepared using Dragon speech recognition software, errors in dictation are unintentional.

## 2018-12-22 ENCOUNTER — Encounter (HOSPITAL_COMMUNITY): Payer: Self-pay | Admitting: *Deleted

## 2018-12-22 ENCOUNTER — Other Ambulatory Visit: Payer: Self-pay

## 2018-12-22 ENCOUNTER — Ambulatory Visit: Payer: Medicaid Other | Admitting: Clinical

## 2018-12-22 ENCOUNTER — Telehealth: Payer: Self-pay | Admitting: Family Medicine

## 2018-12-22 ENCOUNTER — Telehealth: Payer: Self-pay

## 2018-12-22 LAB — COMPREHENSIVE METABOLIC PANEL
ALT: 24 U/L (ref 0–44)
AST: 20 U/L (ref 15–41)
Albumin: 3.8 g/dL (ref 3.5–5.0)
Alkaline Phosphatase: 70 U/L (ref 38–126)
Anion gap: 8 (ref 5–15)
BUN: 10 mg/dL (ref 6–20)
CO2: 26 mmol/L (ref 22–32)
Calcium: 9.3 mg/dL (ref 8.9–10.3)
Chloride: 105 mmol/L (ref 98–111)
Creatinine, Ser: 0.7 mg/dL (ref 0.44–1.00)
GFR calc Af Amer: >60 mL/min (ref >60)
GFR calc non Af Amer: >60 mL/min (ref >60)
Glucose, Bld: 96 mg/dL (ref 70–99)
Potassium: 3.8 mmol/L (ref 3.5–5.1)
Sodium: 139 mmol/L (ref 135–145)
Total Bilirubin: 0.8 mg/dL (ref 0.3–1.2)
Total Protein: 7.1 g/dL (ref 6.5–8.1)

## 2018-12-22 LAB — CBC
HCT: 31 % — ABNORMAL LOW (ref 36.0–46.0)
Hemoglobin: 10.6 g/dL — ABNORMAL LOW (ref 12.0–15.0)
MCH: 25.6 pg — ABNORMAL LOW (ref 26.0–34.0)
MCHC: 34.2 g/dL (ref 30.0–36.0)
MCV: 74.9 fL — ABNORMAL LOW (ref 80.0–100.0)
Platelets: 223 10*3/uL (ref 150–400)
RBC: 4.14 MIL/uL (ref 3.87–5.11)
RDW: 14.6 % (ref 11.5–15.5)
WBC: 11.9 10*3/uL — ABNORMAL HIGH (ref 4.0–10.5)
nRBC: 0.7 % — ABNORMAL HIGH (ref 0.0–0.2)

## 2018-12-22 LAB — URINE CULTURE

## 2018-12-22 LAB — SARS CORONAVIRUS 2 (TAT 6-24 HRS): SARS Coronavirus 2: NEGATIVE

## 2018-12-22 MED ORDER — HYDROMORPHONE 1 MG/ML IV SOLN
INTRAVENOUS | Status: DC
  Administered 2018-12-22: 6.4 mg via INTRAVENOUS
  Administered 2018-12-22: 4.5 mg via INTRAVENOUS
  Administered 2018-12-23: 5.5 mg via INTRAVENOUS
  Administered 2018-12-23: 30 mg via INTRAVENOUS
  Administered 2018-12-23: 6.5 mg via INTRAVENOUS
  Administered 2018-12-23: 8 mg via INTRAVENOUS
  Administered 2018-12-23: 2.5 mg via INTRAVENOUS
  Filled 2018-12-22: qty 30

## 2018-12-22 MED ORDER — ACETAMINOPHEN 325 MG PO TABS: 650.0000 mg | ORAL_TABLET | Freq: Four times a day (QID) | ORAL | Status: DC | PRN

## 2018-12-22 MED ORDER — OXYCODONE HCL 5 MG PO TABS
10.0000 mg | ORAL_TABLET | Freq: Four times a day (QID) | ORAL | Status: DC | PRN
  Administered 2018-12-22 – 2018-12-23 (×3): 10 mg via ORAL
  Filled 2018-12-22 (×3): qty 2

## 2018-12-22 NOTE — Progress Notes (Signed)
Subjective: Stephanie Burch, a 38 year old female with a medical history significant for sickle cell disease, chronic pain syndrome, opiate dependence and tolerance, morbid obesity, history of depression, and anemia of chronic disease was admitted for sickle cell pain crisis.  Patient continues to complain of pain primarily to right lower extremity.  Pain is improved some overnight.  Current pain intensity is 7/10 characterized as constant and throbbing.  Patient denies headache, chest pain, shortness of breath, dysuria, nausea, vomiting, or diarrhea.  Objective:  Vital signs in last 24 hours:  Vitals:   12/22/18 0730 12/22/18 0832 12/22/18 1028 12/22/18 1230  BP:   (!) 145/83   Pulse:   92   Resp: 13 16 10 13   Temp:   98 F (36.7 C)   TempSrc:   Oral   SpO2: 98% 95% 90% 100%  Weight:      Height:        Intake/Output from previous day:   Intake/Output Summary (Last 24 hours) at 12/22/2018 1308 Last data filed at 12/22/2018 1057 Gross per 24 hour  Intake 605.49 ml  Output -  Net 605.49 ml    Physical Exam: General: Alert, awake, oriented x3, in no acute distress.  HEENT: [place]/AT PEERL, EOMI Neck: Trachea midline,  no masses, no thyromegal,y no JVD, no carotid bruit OROPHARYNX:  Moist, No exudate/ erythema/lesions.  Heart: Regular rate and rhythm, without murmurs, rubs, gallops, PMI non-displaced, no heaves or thrills on palpation.  Lungs: Clear to auscultation, no wheezing or rhonchi noted. No increased vocal fremitus resonant to percussion  Abdomen: Soft, nontender, nondistended, positive bowel sounds, no masses no hepatosplenomegaly noted..  Neuro: No focal neurological deficits noted cranial nerves II through XII grossly intact. DTRs 2+ bilaterally upper and lower extremities. Strength 5 out of 5 in bilateral upper and lower extremities. Musculoskeletal: No warm swelling or erythema around joints, no spinal tenderness noted. Psychiatric: Patient alert and oriented x3, good  insight and cognition, good recent to remote recall. Lymph node survey: No cervical axillary or inguinal lymphadenopathy noted.  Lab Results:  Basic Metabolic Panel:    Component Value Date/Time   NA 139 12/22/2018 0540   NA 142 09/11/2017 1431   K 3.8 12/22/2018 0540   CL 105 12/22/2018 0540   CO2 26 12/22/2018 0540   BUN 10 12/22/2018 0540   BUN 8 09/11/2017 1431   CREATININE 0.70 12/22/2018 0540   GLUCOSE 96 12/22/2018 0540   CALCIUM 9.3 12/22/2018 0540   CBC:    Component Value Date/Time   WBC 11.9 (H) 12/22/2018 0540   HGB 10.6 (L) 12/22/2018 0540   HGB 13.0 09/11/2017 1431   HCT 31.0 (L) 12/22/2018 0540   HCT 38.4 09/11/2017 1431   PLT 223 12/22/2018 0540   PLT 258 09/11/2017 1431   MCV 74.9 (L) 12/22/2018 0540   MCV 75 (L) 09/11/2017 1431   NEUTROABS 5.4 12/21/2018 0954   NEUTROABS 3.9 09/11/2017 1431   LYMPHSABS 1.8 12/21/2018 0954   LYMPHSABS 2.2 09/11/2017 1431   MONOABS 0.7 12/21/2018 0954   EOSABS 0.2 12/21/2018 0954   EOSABS 0.1 09/11/2017 1431   BASOSABS 0.0 12/21/2018 0954   BASOSABS 0.0 09/11/2017 1431    Recent Results (from the past 240 hour(s))  SARS CORONAVIRUS 2 (TAT 6-24 HRS) Nasopharyngeal Nasopharyngeal Swab     Status: None   Collection Time: 12/21/18  5:58 PM   Specimen: Nasopharyngeal Swab  Result Value Ref Range Status   SARS Coronavirus 2 NEGATIVE NEGATIVE Final  Comment: (NOTE) SARS-CoV-2 target nucleic acids are NOT DETECTED. The SARS-CoV-2 RNA is generally detectable in upper and lower respiratory specimens during the acute phase of infection. Negative results do not preclude SARS-CoV-2 infection, do not rule out co-infections with other pathogens, and should not be used as the sole basis for treatment or other patient management decisions. Negative results must be combined with clinical observations, patient history, and epidemiological information. The expected result is Negative. Fact Sheet for  Patients: HairSlick.no Fact Sheet for Healthcare Providers: quierodirigir.com This test is not yet approved or cleared by the [place] FDA and  has been authorized for detection and/or diagnosis of SARS-CoV-2 by FDA under an Emergency Use Authorization (EUA). This EUA will remain  in effect (meaning this test can be used) for the duration of the COVID-19 declaration under Section 56 4(b)(1) of the Act, 21 U.S.C. section 360bbb-3(b)(1), unless the authorization is terminated or revoked sooner. Performed at [facility], [address], [place], [place] [address]     Studies/Results: No results found.  Medications: Scheduled Meds: . DULoxetine  20 mg Oral Daily  . enoxaparin (LOVENOX) injection  40 mg Subcutaneous Q24H  . folic acid  1 mg Oral Daily  . HYDROmorphone   Intravenous Q4H  . hydroxyurea  2,000 mg Oral Daily  . ketorolac  15 mg Intravenous Q6H  . oxyCODONE  10 mg Oral Q12H  . senna-docusate  1 tablet Oral BID  . tiZANidine  4 mg Oral TID  . voxelotor  1,500 mg Oral Daily   Continuous Infusions: . sodium chloride 100 mL/hr at 12/22/18 1137  . diphenhydrAMINE     PRN Meds:.acetaminophen, diphenhydrAMINE **OR** diphenhydrAMINE, naloxone **AND** sodium chloride flush, ondansetron (ZOFRAN) IV, oxyCODONE, polyethylene glycol  Consultants:  None  Procedures:  None  Antibiotics:  None  Assessment/Plan: Principal Problem:   Sickle cell pain crisis (HCC) Active Problems:   Morbidly obese (HCC)   Chronic pain syndrome   Anxiety  Sickle cell disease with pain crisis: Weaning IV Dilaudid PCA.  Settings decreased to 0.5 mg, 10-minute lockout, and 3 mg/h. Oxycodone 10 mg every 6 hours as needed for severe breakthrough pain Toradol 15 mg IV every 6 hours for total of 5 days Reevaluate pain scale regularly Monitor vital signs closely  Chronic pain syndrome: Stable.  Continue home  medications.  Sickle cell anemia: Hemoglobin is consistent with patient's baseline.  There is no clinical indication for blood transfusion at this time.  Continue to monitor closely.  Morbid obesity: Patient counseled at length on admission.  Heart healthy diet recommended.  History of depression: Continue Cymbalta.  No SI or HI  Code Status: Full Code Family Communication: N/A Disposition Plan: Not yet ready for discharge  Nolon Nations  APRN, MSN, FNP-C Patient Care Center [facility] [address] [place], [place] [address] [phone]  If 5PM-7AM, please contact night-coverage.  12/22/2018, 1:08 PM  LOS: 1 day

## 2018-12-22 NOTE — BH Specialist Note (Signed)
Integrated Behavioral Health Note  Session Start time: 11:10   End Time: 1:00 Total Time: 110 min Type of Service: [place] Interpreter: No.   Interpreter Name & Language: none  Session #:   SUBJECTIVE: Stephanie Burch is a 38 y.o. female brought in by patient.  Pt./Family was referred by NP for:  anxiety, depression and stress precipitating sickle cell crisis. Pt./Family reports the following symptoms/concerns: stress from financial difficulties, depression, anxiety Duration of problem: approx 2 years Severity: moderate Previous treatment: none  OBJECTIVE: Mood: Euthymic and tearful at times & Affect: Appropriate Risk of harm to self or others: none Assessments administered: none  LIFE CONTEXT:  Family & Social:lives independently, co-parents her dependent children with the children's father School/ Work:currently not employed, but exploring a job Acupuncturist, listens to music, watches motivational videos, positive self-talk Life changes:recent break-up with partner What is important to pt/family (values):her children, being independent/strong, working   Engineer, 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 Close relationship with her children and enjoys spending time with them. Has some positive coping skills such as using mindfulness and meditation. Motivated to engage in therapy.  GOALS ADDRESSED: Patient will: 1. Reduce symptoms of: anxiety, depression and stress  2.  Increase knowledge and/or ability of: coping skills and self-management skills  3.  Demonstrate ability to: Increase healthy adjustment to current life circumstances and Begin healthy grieving over loss  INTERVENTIONS: Interventions utilized:  Brief CBT and Supportive Counseling Standardized Assessments completed: Not Needed  Patient scheduled for IBH office visit today, but held session in [place], as patient admitted for sickle  cell crisis. Patient feels crisis precipitated by ongoing stressors related to finances and to the loss of her brother this year. Patient discussion centered on themes of disappointment about change in independence and perception that she's let her family down. CBT to challenge negative thoughts. Also began processing grief over loss of her brother. Normalization of grief process.  Patient continues to use mindfulness exercises, though more often uses them when she's already feeling well than when she's feeling down. Strategies for motivation to engage in healthy coping skills when mood is negative. Encouraged and modeled positive self-talk.    PLAN: 1. F/U with behavioral health clinician: 01/06/19 in clinic 2. Behavioral recommendations: practice positive self-talk, continue practicing mindfulness 3. Referral: none  4. From scale of 1-10, how likely are you to follow plan:  Estanislado Emms, [place] [facility] [phone]

## 2018-12-22 NOTE — Progress Notes (Signed)
Dear Doctor:  This patient has been identified as a candidate for PICC for the following reason (s): restarts due to phlebitis and infiltration in 24 hours If you agree, please write an order for the indicated device.   Thank you for supporting the early vascular access assessment program. 

## 2018-12-22 NOTE — Telephone Encounter (Signed)
error 

## 2018-12-22 NOTE — Telephone Encounter (Signed)
Patient is in the hospital today (Sickle Cell) . She has requested Rx Oxycodone 20 mg to be sent today for pick-up tomorrow. Please advise.

## 2018-12-23 MED ORDER — OXYCODONE HCL 5 MG PO TABS
10.0000 mg | ORAL_TABLET | ORAL | Status: DC | PRN
  Administered 2018-12-23 – 2018-12-24 (×6): 10 mg via ORAL
  Filled 2018-12-23 (×6): qty 2

## 2018-12-23 MED ORDER — HYDROMORPHONE 1 MG/ML IV SOLN
INTRAVENOUS | Status: DC
  Administered 2018-12-23: 7.5 mg via INTRAVENOUS
  Administered 2018-12-23: 3 mg via INTRAVENOUS
  Administered 2018-12-24: 5 mg via INTRAVENOUS
  Administered 2018-12-24: 0.5 mg via INTRAVENOUS
  Administered 2018-12-24: 1 mg via INTRAVENOUS

## 2018-12-23 NOTE — TOC Initial Note (Signed)
Transition of Care [facility]) - Initial/Assessment Note    Patient Details  Name: Stephanie Burch MRN: [id number] Date of Birth: Jan 17, 1981  Transition of Care [facility]) CM/SW Contact:    Camdynn Maranto, Marjie Skiff, RN Phone Number:[phone] 12/23/2018, 12:41 PM   Expected Discharge Plan: Home/Self Care Barriers to Discharge: Continued Medical Work up   Expected Discharge Plan and Services Expected Discharge Plan: Home/Self Care   Discharge Planning Services: CM Consult         Prior Living Arrangements/Services   Lives with:: Minor Children     Activities of Daily Living Home Assistive Devices/Equipment: None ADL Screening (condition at time of admission) Patient's cognitive ability adequate to safely complete daily activities?: Yes Is the patient deaf or have difficulty hearing?: No Does the patient have difficulty seeing, even when wearing glasses/contacts?: No Does the patient have difficulty concentrating, remembering, or making decisions?: No Patient able to express need for assistance with ADLs?: Yes Does the patient have difficulty dressing or bathing?: No Independently performs ADLs?: Yes (appropriate for developmental age) Does the patient have difficulty walking or climbing stairs?: No Weakness of Legs: None Weakness of Arms/Hands: None   Admission diagnosis:  Sickle cell pain crisis [facility]) [D57.00] Patient Active Problem List   Diagnosis Date Noted  . Anxiety 11/15/2018  . Vitamin D deficiency 11/15/2018  . Nausea 11/15/2018  . Abnormal pulse oximetry   . Hypokalemia 09/20/2018  . Medication management 09/14/2018  . Muscle spasms of both lower extremities 08/25/2018  . Subjective visual disturbance of right eye 08/25/2018  . Hb-SS disease without crisis ([place]) 04/13/2018  . Chronic pain syndrome 03/16/2018  . Chronic, continuous use of opioids 03/16/2018  . Tachycardia with heart rate 100-120 beats per minute   . Sickle cell anemia with crisis ([place]) 10/19/2017  .  Leukocytosis 10/19/2017  . Thrombocytopenia ([place]) 10/19/2017  . Morbidly obese ([place]) 10/19/2017  . Sickle cell pain crisis ([place]) 10/15/2017   PCP:  Azzie Glatter, FNP Pharmacy:   [id number] [place], [place] [address] [place] [address] Phone: [phone] Fax: [phone]     Social Determinants of Health (SDOH) Interventions    Readmission Risk Interventions Readmission Risk Prevention Plan 12/23/2018  Transportation Screening Complete  PCP or Specialist Appt within 3-5 Days Complete  HRI or [place] Not Complete  HRI or Home Care Consult comments NA  Social Work Consult for [place] Planning/Counseling Complete  Palliative Care Screening Not Applicable  Medication Review Press photographer) Complete

## 2018-12-23 NOTE — BH Specialist Note (Signed)
Integrated Behavioral Health Note  Reason for Referral: Stephanie Burch is a 37 y.o. female  Pt was referred by self for: additional resources/support   Pt reports the following concerns: pain exacerbation from stress  Plan: 1. Addressed today: Patient contacted CSW and indicated her pain was exacerbated by argument with ex-partner. CSW is following patient to provide behavioral health support from the [facility]. Visited patient briefly in [facility] and provided CBT exercises for patient to work on. Provided brief supportive counseling at bedside; had full session at bedside yesterday. Will continue to follow for support from clinic, next outpatient IBH appointment is 01/06/19.  2. Referral: none  3. Follow up: 01/06/19  Estanislado Emms, [place] [facility] [phone]

## 2018-12-23 NOTE — Progress Notes (Signed)
Subjective: Stephanie Burch, a 38 year old female with a medical history significant for sickle cell disease, chronic pain syndrome, opiate dependence and tolerance, morbid obesity, history of depression, and anemia of chronic disease was admitted for sickle cell pain crisis. Patient continues to complain of pain primarily to right lower extremity.  Pain has improved minimally overnight.  Pain intensity is 7/10.  Patient has been able to mobilize in room without assistance. Patient denies headache, chest pain, shortness of breath, dysuria, nausea, vomiting, or diarrhea.  Objective:  Vital signs in last 24 hours:  Vitals:   12/23/18 0406 12/23/18 0800 12/23/18 0956 12/23/18 1110  BP: 124/86  (!) 142/86   Pulse: 80  97   Resp: 15 14 17 14   Temp: 97.9 F (36.6 C)  98.7 F (37.1 C)   TempSrc: Oral  Oral   SpO2: 100% 99% 99% 96%  Weight:      Height:        Intake/Output from previous day:   Intake/Output Summary (Last 24 hours) at 12/23/2018 1258 Last data filed at 12/23/2018 0900 Gross per 24 hour  Intake 1654.2 ml  Output 1 ml  Net 1653.2 ml    Physical Exam: General: Alert, awake, oriented x3, in no acute distress.  HEENT: [place]/AT PEERL, EOMI Neck: Trachea midline,  no masses, no thyromegal,y no JVD, no carotid bruit OROPHARYNX:  Moist, No exudate/ erythema/lesions.  Heart: Regular rate and rhythm, without murmurs, rubs, gallops, PMI non-displaced, no heaves or thrills on palpation.  Lungs: Clear to auscultation, no wheezing or rhonchi noted. No increased vocal fremitus resonant to percussion  Abdomen: Soft, nontender, nondistended, positive bowel sounds, no masses no hepatosplenomegaly noted..  Neuro: No focal neurological deficits noted cranial nerves II through XII grossly intact. DTRs 2+ bilaterally upper and lower extremities. Strength 5 out of 5 in bilateral upper and lower extremities. Musculoskeletal: No warm swelling or erythema around joints, no spinal tenderness  noted. Psychiatric: Patient alert and oriented x3, good insight and cognition, good recent to remote recall. Lymph node survey: No cervical axillary or inguinal lymphadenopathy noted.  Lab Results:  Basic Metabolic Panel:    Component Value Date/Time   NA 139 12/22/2018 0540   NA 142 09/11/2017 1431   K 3.8 12/22/2018 0540   CL 105 12/22/2018 0540   CO2 26 12/22/2018 0540   BUN 10 12/22/2018 0540   BUN 8 09/11/2017 1431   CREATININE 0.70 12/22/2018 0540   GLUCOSE 96 12/22/2018 0540   CALCIUM 9.3 12/22/2018 0540   CBC:    Component Value Date/Time   WBC 11.9 (H) 12/22/2018 0540   HGB 10.6 (L) 12/22/2018 0540   HGB 13.0 09/11/2017 1431   HCT 31.0 (L) 12/22/2018 0540   HCT 38.4 09/11/2017 1431   PLT 223 12/22/2018 0540   PLT 258 09/11/2017 1431   MCV 74.9 (L) 12/22/2018 0540   MCV 75 (L) 09/11/2017 1431   NEUTROABS 5.4 12/21/2018 0954   NEUTROABS 3.9 09/11/2017 1431   LYMPHSABS 1.8 12/21/2018 0954   LYMPHSABS 2.2 09/11/2017 1431   MONOABS 0.7 12/21/2018 0954   EOSABS 0.2 12/21/2018 0954   EOSABS 0.1 09/11/2017 1431   BASOSABS 0.0 12/21/2018 0954   BASOSABS 0.0 09/11/2017 1431    Recent Results (from the past 240 hour(s))  Urine culture     Status: Abnormal   Collection Time: 12/21/18  9:54 AM   Specimen: Urine, Clean Catch  Result Value Ref Range Status   Specimen Description   Final  URINE, CLEAN CATCH Performed at [facility], [address] [place], [place] [address]    Special Requests   Final    NONE Performed at [facility], [address] [place], [place] [address]    Culture MULTIPLE SPECIES PRESENT, SUGGEST RECOLLECTION (A)  Final   Report Status 12/22/2018 FINAL  Final  SARS CORONAVIRUS 2 (TAT 6-24 HRS) Nasopharyngeal Nasopharyngeal Swab     Status: None   Collection Time: 12/21/18  5:58 PM   Specimen: Nasopharyngeal Swab  Result Value Ref Range Status   SARS Coronavirus 2 NEGATIVE NEGATIVE Final     Comment: (NOTE) SARS-CoV-2 target nucleic acids are NOT DETECTED. The SARS-CoV-2 RNA is generally detectable in upper and lower respiratory specimens during the acute phase of infection. Negative results do not preclude SARS-CoV-2 infection, do not rule out co-infections with other pathogens, and should not be used as the sole basis for treatment or other patient management decisions. Negative results must be combined with clinical observations, patient history, and epidemiological information. The expected result is Negative. Fact Sheet for Patients: HairSlick.nohttps://www.fda.gov/media/138098/download Fact Sheet for Healthcare Providers: quierodirigir.comhttps://www.fda.gov/media/138095/download This test is not yet approved or cleared by the [place] FDA and  has been authorized for detection and/or diagnosis of SARS-CoV-2 by FDA under an Emergency Use Authorization (EUA). This EUA will remain  in effect (meaning this test can be used) for the duration of the COVID-19 declaration under Section 56 4(b)(1) of the Act, 21 U.S.C. section 360bbb-3(b)(1), unless the authorization is terminated or revoked sooner. Performed at [facility], [address] [place], [place] [address]     Studies/Results: No results found.  Medications: Scheduled Meds: . DULoxetine  20 mg Oral Daily  . enoxaparin (LOVENOX) injection  40 mg Subcutaneous Q24H  . folic acid  1 mg Oral Daily  . HYDROmorphone   Intravenous Q4H  . hydroxyurea  2,000 mg Oral Daily  . ketorolac  15 mg Intravenous Q6H  . oxyCODONE  10 mg Oral Q12H  . senna-docusate  1 tablet Oral BID  . tiZANidine  4 mg Oral TID  . voxelotor  1,500 mg Oral Daily   Continuous Infusions: . diphenhydrAMINE     PRN Meds:.acetaminophen, diphenhydrAMINE **OR** diphenhydrAMINE, naloxone **AND** sodium chloride flush, ondansetron (ZOFRAN) IV, oxyCODONE, polyethylene  glycol  Consultants:  None  Procedures:  None  Antibiotics:  None  Assessment/Plan: Principal Problem:   Sickle cell pain crisis (HCC) Active Problems:   Morbidly obese (HCC)   Chronic pain syndrome   Anxiety  Sickle cell disease with pain crisis: Continue weaning IV Dilaudid PCA.  Settings decreased to 0.5 mg, 10-minute lockout, and 1.5 mg/h. Oxycodone 10 mg every 4 hours as needed for severe breakthrough pain Toradol 15 mg IV every 6 hours for a total of 5 days. Reevaluate pain scale regularly Monitor vital signs closely  Chronic pain syndrome: Stable.  Continue home medications.  Sickle cell anemia: Hemoglobin continues to be consistent with patient's baseline.  There is no clinical indication for blood transfusion at this time.  Continue to monitor closely.  Morbid obesity: Patient counseled at length on admission.  Heart healthy diet recommended.  History of depression: Stable.  Continue home medications   Code Status: Full Code Family Communication: N/A Disposition Plan: Not yet ready for discharge  Stephanie Burch Rennis PettyMoore Zvi Duplantis  APRN, MSN, FNP-C Patient Care Center [facility] [address]  [place], [place] [address] [phone]  If 5PM-7AM, please contact night-coverage.  12/23/2018, 12:58 PM  LOS: 2 days

## 2018-12-24 ENCOUNTER — Other Ambulatory Visit: Payer: Self-pay | Admitting: Family Medicine

## 2018-12-24 ENCOUNTER — Telehealth: Payer: Self-pay

## 2018-12-24 DIAGNOSIS — F1190 Opioid use, unspecified, uncomplicated: Secondary | ICD-10-CM

## 2018-12-24 DIAGNOSIS — D571 Sickle-cell disease without crisis: Secondary | ICD-10-CM

## 2018-12-24 DIAGNOSIS — G894 Chronic pain syndrome: Secondary | ICD-10-CM

## 2018-12-24 MED ORDER — ONDANSETRON HCL 4 MG PO TABS
4.0000 mg | ORAL_TABLET | Freq: Once | ORAL | Status: AC
  Administered 2018-12-24: 4 mg via ORAL
  Filled 2018-12-24: qty 1

## 2018-12-24 MED ORDER — OXYCODONE HCL 20 MG PO TABS: 20.0000 mg | ORAL_TABLET | ORAL | 0 refills | Status: DC | PRN

## 2018-12-24 NOTE — Telephone Encounter (Signed)
Pharmacy faxed back note Rx B-12 50 MCG is not available. Per Lanelle Bal okay to change to Tablets  B-12 100 MCG  Once Daily.   Verbal order called to pharmacist.

## 2018-12-24 NOTE — Progress Notes (Signed)
Pt discharged home in stable condition. Discharge instructions given. Scripts sent to pharmacy of choice. No immediate questions or concerns at this time. Pt discharged from unit via wheelchair. 

## 2018-12-24 NOTE — Discharge Summary (Signed)
Physician Discharge Summary  Stephanie Burch ERX:[id number] DOB: May 17, 1980 DOA: 12/21/2018  PCP: Azzie Glatter, FNP  Admit date: 12/21/2018  Discharge date: 12/24/2018  Discharge Diagnoses:  Principal Problem:   Sickle cell pain crisis [facility]) Active Problems:   Morbidly obese (HCC)   Chronic pain syndrome   Anxiety   Discharge Condition: Stable  Disposition:  Follow-up Information    Azzie Glatter, FNP Follow up.   Specialty: Family Medicine Contact information: [place] [address] [phone]          Pt is discharged home in good condition and is to follow up with Azzie Glatter, FNP in 1 week to have labs evaluated. Stephanie Burch is instructed to increase activity slowly and balance with rest for the next few days, and use prescribed medication to complete treatment of pain  Diet: Regular Wt Readings from Last 3 Encounters:  12/22/18 117.9 kg  11/15/18 123 kg  11/10/18 125.7 kg    History of present illness:  Stephanie Burch is a 38 year old female with a medical history significant for sickle cell disease, chronic pain syndrome, opiate dependence and tolerance, morbid obesity, history of depression, and anemia of chronic disease presents complaining of pain to right upper and lower extremities that is consistent with her typical sickle cell pain crisis.  Patient states the pain intensity has been elevated over the past week and has been uncontrolled by home medications.  Patient states that she has been taking more medications than prescribed to alleviate pain without success.  Current pain intensity is 9/10 characterized as constant and throbbing.  Patient denies headache, dizziness, paresthesias, shortness of breath, chest pain, dysuria, nausea, vomiting, or diarrhea.  No sick contacts, recent travel, or exposure to COVID-19.  Hospital Course:   Sickle cell disease with pain crisis:  Patient was admitted for sickle cell pain crisis  and managed appropriately with IVF, IV Dilaudid via PCA and IV Toradol, as well as other adjunct therapies per sickle cell pain management protocols.  IV Dilaudid weaned appropriately.  Patient transition to oxycodone 20 mg every 6 hours as needed.  OxyContin was continued throughout admission without interruption.  Discussed pain management with patient at length.  Patient will follow-up with Kathe Becton, FNP for medication management and to repeat CBC and BMP in 1 week.  Patient alert, oriented, and ambulating without assistance.  Pain intensity is 5/10, patient states that she can manage at home on prescribed medications.  She is afebrile and maintaining oxygen saturation at 100% on RA.  Patient was discharged home today in a hemodynamically stable condition.   Discharge Exam: Vitals:   12/24/18 1004 12/24/18 1230  BP: (!) 146/89   Pulse: 77   Resp: 18 15  Temp: 98.2 F (36.8 C)   SpO2: 100% 100%   Vitals:   12/24/18 0432 12/24/18 0622 12/24/18 1004 12/24/18 1230  BP:  (!) 144/84 (!) 146/89   Pulse:  76 77   Resp: 19 17 18 15   Temp:  98.2 F (36.8 C) 98.2 F (36.8 C)   TempSrc:  Oral Oral   SpO2: 99% 100% 100% 100%  Weight:      Height:       General appearance : Awake, alert, not in any distress. Speech Clear. Not toxic looking HEENT: Atraumatic and Normocephalic, pupils equally reactive to light and accomodation Neck: Supple, no JVD. No cervical lymphadenopathy.  Chest: Good air entry bilaterally, no added sounds  CVS: S1 S2 regular,  no murmurs.  Abdomen: Bowel sounds present, Non tender and not distended with no gaurding, rigidity or rebound. Extremities: B/L Lower Ext shows no edema, both legs are warm to touch Neurology: Awake alert, and oriented X 3, CN II-XII intact, Non focal Skin: No Rash  Discharge Instructions  Discharge Instructions    Discharge patient   Complete by: As directed    Discharge disposition: 01-Home or Self Care   Discharge patient date:  12/24/2018     Allergies as of 12/24/2018   No Known Allergies     Medication List    TAKE these medications   docusate sodium 100 MG capsule Commonly known as: Colace Take 1 capsule (100 mg total) by mouth 2 (two) times daily.   DULoxetine 20 MG capsule Commonly known as: CYMBALTA TAKE 1 CAPSULE BY MOUTH EVERY DAY   folic acid 1 MG tablet Commonly known as: FOLVITE Take 1 tablet (1 mg total) by mouth daily.   hydroxyurea 500 MG capsule Commonly known as: Hydrea Take 4 capsules (2,000 mg total) by mouth daily. May take with food to minimize GI side effects.   ibuprofen 800 MG tablet Commonly known as: ADVIL Take 1 tablet (800 mg total) by mouth every 8 (eight) hours as needed for headache, mild pain, moderate pain or cramping.   naloxone 0.4 MG/ML injection Commonly known as: NARCAN As needed   ondansetron 4 MG tablet Commonly known as: Zofran Take 1 tablet (4 mg total) by mouth every 8 (eight) hours as needed for nausea or vomiting.   Oxbryta 500 MG Tabs tablet Generic drug: voxelotor Take 1,500 mg by mouth daily. For Sickle Cell Anemia   Oxycodone HCl 20 MG Tabs Take 1 tablet (20 mg total) by mouth every 4 (four) hours as needed (for pain).   prochlorperazine 10 MG tablet Commonly known as: COMPAZINE Take 1 tablet (10 mg total) by mouth every 6 (six) hours as needed for nausea or vomiting. Take if Ondansetron is not effective.   tiZANidine 4 MG capsule Commonly known as: ZANAFLEX TAKE 1 CAPSULE (4 MG TOTAL) BY MOUTH 3 (THREE) TIMES DAILY.   vitamin B-12 50 MCG tablet Commonly known as: CYANOCOBALAMIN Take 1 tablet (50 mcg total) by mouth daily.   Vitamin D (Ergocalciferol) 1.25 MG (50000 UT) Caps capsule Commonly known as: DRISDOL Take 1 capsule (50,000 Units total) by mouth every 7 (seven) days.   Xtampza ER 13.5 MG C12a Generic drug: oxyCODONE ER Take 13.5 mg by mouth every 12 (twelve) hours.       The results of significant diagnostics from  this hospitalization (including imaging, microbiology, ancillary and laboratory) are listed below for reference.    Significant Diagnostic Studies: No results found.  Microbiology: Recent Results (from the past 240 hour(s))  Urine culture     Status: Abnormal   Collection Time: 12/21/18  9:54 AM   Specimen: Urine, Clean Catch  Result Value Ref Range Status   Specimen Description   Final    URINE, CLEAN CATCH Performed at [facility], [address], [place], [place] [address]    Special Requests   Final    NONE Performed at [facility], [address], [place], [place] [address]    Culture MULTIPLE SPECIES PRESENT, SUGGEST RECOLLECTION (A)  Final   Report Status 12/22/2018 FINAL  Final  SARS CORONAVIRUS 2 (TAT 6-24 HRS) Nasopharyngeal Nasopharyngeal Swab     Status: None   Collection Time: 12/21/18  5:58 PM  Specimen: Nasopharyngeal Swab  Result Value Ref Range Status   SARS Coronavirus 2 NEGATIVE NEGATIVE Final    Comment: (NOTE) SARS-CoV-2 target nucleic acids are NOT DETECTED. The SARS-CoV-2 RNA is generally detectable in upper and lower respiratory specimens during the acute phase of infection. Negative results do not preclude SARS-CoV-2 infection, do not rule out co-infections with other pathogens, and should not be used as the sole basis for treatment or other patient management decisions. Negative results must be combined with clinical observations, patient history, and epidemiological information. The expected result is Negative. Fact Sheet for Patients: HairSlick.nohttps://www.fda.gov/media/138098/download Fact Sheet for Healthcare Providers: quierodirigir.comhttps://www.fda.gov/media/138095/download This test is not yet approved or cleared by the [place] FDA and  has been authorized for detection and/or diagnosis of SARS-CoV-2 by FDA under an Emergency Use Authorization (EUA). This EUA will remain  in effect (meaning this test can be used) for  the duration of the COVID-19 declaration under Section 56 4(b)(1) of the Act, 21 U.S.C. section 360bbb-3(b)(1), unless the authorization is terminated or revoked sooner. Performed at [facility], [address] [place], [place] [address]      Labs: Basic Metabolic Panel: Recent Labs  Lab 12/21/18 0954 12/22/18 0540  NA 140 139  K 4.2 3.8  CL 108 105  CO2 23 26  GLUCOSE 99 96  BUN 8 10  CREATININE 0.64 0.70  CALCIUM 9.3 9.3   Liver Function Tests: Recent Labs  Lab 12/21/18 0954 12/22/18 0540  AST 21 20  ALT 25 24  ALKPHOS 74 70  BILITOT 1.3* 0.8  PROT 7.6 7.1  ALBUMIN 4.2 3.8   No results for input(s): LIPASE, AMYLASE in the last 168 hours. No results for input(s): AMMONIA in the last 168 hours. CBC: Recent Labs  Lab 12/21/18 0954 12/22/18 0540  WBC 8.1 11.9*  NEUTROABS 5.4  --   HGB 11.9* 10.6*  HCT 35.2* 31.0*  MCV 75.4* 74.9*  PLT 254 223   Cardiac Enzymes: No results for input(s): CKTOTAL, CKMB, CKMBINDEX, TROPONINI in the last 168 hours. BNP: Invalid input(s): POCBNP CBG: No results for input(s): GLUCAP in the last 168 hours.  Time coordinating discharge: 50 minutes  Signed:  Nolon NationsLachina Moore Calianne Larue  APRN, MSN, FNP-C Patient Care [facility] [address]  [place], [place] [address] [phone]  [facility] 12/24/2018, 12:40 PM

## 2018-12-27 ENCOUNTER — Non-Acute Institutional Stay (HOSPITAL_COMMUNITY): Admit: 2018-12-27 | Payer: Medicaid Other | Admitting: Internal Medicine

## 2019-01-04 ENCOUNTER — Other Ambulatory Visit: Payer: Self-pay | Admitting: Family Medicine

## 2019-01-04 DIAGNOSIS — R110 Nausea: Secondary | ICD-10-CM

## 2019-01-05 ENCOUNTER — Telehealth: Payer: Self-pay | Admitting: Family Medicine

## 2019-01-05 ENCOUNTER — Other Ambulatory Visit: Payer: Self-pay | Admitting: Family Medicine

## 2019-01-05 DIAGNOSIS — D571 Sickle-cell disease without crisis: Secondary | ICD-10-CM

## 2019-01-05 DIAGNOSIS — F1190 Opioid use, unspecified, uncomplicated: Secondary | ICD-10-CM

## 2019-01-05 DIAGNOSIS — G894 Chronic pain syndrome: Secondary | ICD-10-CM

## 2019-01-05 MED ORDER — XTAMPZA ER 13.5 MG PO C12A: 13.5000 mg | EXTENDED_RELEASE_CAPSULE | Freq: Two times a day (BID) | ORAL | 0 refills | Status: DC

## 2019-01-05 MED ORDER — OXYCODONE HCL 20 MG PO TABS: 20.0000 mg | ORAL_TABLET | ORAL | 0 refills | Status: DC | PRN

## 2019-01-06 ENCOUNTER — Other Ambulatory Visit: Payer: Self-pay | Admitting: Family Medicine

## 2019-01-06 ENCOUNTER — Ambulatory Visit: Payer: Medicaid Other | Admitting: Clinical

## 2019-01-06 DIAGNOSIS — D5700 Hb-SS disease with crisis, unspecified: Secondary | ICD-10-CM

## 2019-01-06 DIAGNOSIS — G894 Chronic pain syndrome: Secondary | ICD-10-CM

## 2019-01-06 DIAGNOSIS — Z79899 Other long term (current) drug therapy: Secondary | ICD-10-CM

## 2019-01-06 NOTE — Telephone Encounter (Signed)
done

## 2019-01-17 ENCOUNTER — Telehealth: Payer: Self-pay | Admitting: Family Medicine

## 2019-01-17 ENCOUNTER — Other Ambulatory Visit: Payer: Self-pay | Admitting: Family Medicine

## 2019-01-17 ENCOUNTER — Ambulatory Visit: Payer: Medicaid Other | Admitting: Family Medicine

## 2019-01-17 DIAGNOSIS — G894 Chronic pain syndrome: Secondary | ICD-10-CM

## 2019-01-17 DIAGNOSIS — F1190 Opioid use, unspecified, uncomplicated: Secondary | ICD-10-CM

## 2019-01-17 DIAGNOSIS — D571 Sickle-cell disease without crisis: Secondary | ICD-10-CM

## 2019-01-17 MED ORDER — OXYCODONE HCL 20 MG PO TABS: 20.0000 mg | ORAL_TABLET | ORAL | 0 refills | Status: DC | PRN

## 2019-01-17 NOTE — Telephone Encounter (Signed)
Sent to NP and RMA 

## 2019-01-18 ENCOUNTER — Ambulatory Visit (INDEPENDENT_AMBULATORY_CARE_PROVIDER_SITE_OTHER): Payer: Medicaid Other | Admitting: Clinical

## 2019-01-18 ENCOUNTER — Ambulatory Visit (INDEPENDENT_AMBULATORY_CARE_PROVIDER_SITE_OTHER): Payer: Medicaid Other | Admitting: Family Medicine

## 2019-01-18 ENCOUNTER — Encounter: Payer: Self-pay | Admitting: Family Medicine

## 2019-01-18 ENCOUNTER — Other Ambulatory Visit: Payer: Self-pay

## 2019-01-18 VITALS — BP 134/78 | HR 91 | Temp 98.6°F | Ht 65.0 in | Wt 263.8 lb

## 2019-01-18 DIAGNOSIS — F419 Anxiety disorder, unspecified: Secondary | ICD-10-CM

## 2019-01-18 DIAGNOSIS — G894 Chronic pain syndrome: Secondary | ICD-10-CM

## 2019-01-18 DIAGNOSIS — F1190 Opioid use, unspecified, uncomplicated: Secondary | ICD-10-CM | POA: Diagnosis not present

## 2019-01-18 DIAGNOSIS — D571 Sickle-cell disease without crisis: Secondary | ICD-10-CM | POA: Diagnosis not present

## 2019-01-18 DIAGNOSIS — Z09 Encounter for follow-up examination after completed treatment for conditions other than malignant neoplasm: Secondary | ICD-10-CM

## 2019-01-18 DIAGNOSIS — R110 Nausea: Secondary | ICD-10-CM

## 2019-01-18 LAB — POCT URINALYSIS DIPSTICK
Glucose, UA: NEGATIVE
Ketones, UA: NEGATIVE
Leukocytes, UA: NEGATIVE
Nitrite, UA: NEGATIVE
Protein, UA: NEGATIVE
Spec Grav, UA: 1.015 (ref 1.010–1.025)
Urobilinogen, UA: 2 E.U./dL — AB
pH, UA: 6 (ref 5.0–8.0)

## 2019-01-18 NOTE — BH Specialist Note (Signed)
Integrated Behavioral Health Follow Up Visit  MRN: [id number] Name: Kaylla Cobos  Number of [place] Clinician visits: 6/10 Session Start time: 11:55  Session End time: 12:55 Total time: 60  Type of Service: [place] Interpretor:No. Interpretor Name and Language: none  SUBJECTIVE: Stephanie Burch is a 38 y.o. female accompanied by self Patient was referred by NP for anxiety, depression, stress precipitating sickle cell crises. 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  Reduce symptoms of: anxiety, depression and stress  2.  Increase knowledge and/or ability of: coping skills and self-management skills  3.  Demonstrate ability to: Increase healthy adjustment to current life circumstances  INTERVENTIONS: Interventions utilized:  Brief CBT, Supportive Counseling and ACT Standardized Assessments completed: Not Needed  ASSESSMENT: Patient currently feeling experiencing negative emotions and feeling tired as it relates to her pain experience. Supportive counseling on patient's report of emotional and pain experience in the past few weeks. Patient reported period of a few weeks in which she exhibited positive self talk and experienced positive emotions more often and  overall felt more energized. Then patient had a few days of where she found it difficult to engage in positive self talk.   Supportive counseling and reinforced healthy self-management skills patient has been using. CBT approach to acknowledging how thoughts about pain relate to resulting emotions and behaviors. ACT strategy of noting and tracking pain experience as well as thoughts about pain - "clean and dirty pain" exercise assigned for homework.    Patient may benefit from continued CBT interventions to challenge negative thoughts and manage chronic pain experience.   PLAN: 1. Follow up with behavioral health clinician on : in January after holidays, will call to schedule  2. Behavioral recommendations: tracking thoughts and emotions related to pain 3. Referral(s): [place] (In Clinic) 4. "From scale of 1-10, how likely are you to follow plan?": very likely  Estanislado Emms, [facility] [phone]

## 2019-01-18 NOTE — Progress Notes (Signed)
[facility]   Subjective:  Patient ID: Stephanie Burch, female    DOB: 1980-09-12  Age: 38 y.o. MRN: [id number]  CC:  Chief Complaint  Patient presents with  . Follow-up    Sickle Cell    HPI Stephanie Burch is a 38 female who presents for Follow Up today.   Past Medical History:  Diagnosis Date  . Nausea   . Sickle cell anemia (HCC)   . Vitamin B12 deficiency 12/2018  . Vitamin D deficiency    Current Status: Since her last office visit, she is doing well with no complaints. She states that her has pain in h legs. She rates her pain today at 5-6/10. She has not had a hospital visit for Sickle Cell Crisis since 12/06/2018 where she was treated and discharged the same day. She is currently taking all medications as prescribed and staying well hydrated. She reports occasional nausea, constipation, dizziness and headaches. Her anxiety is mild anxiety today. She denies suicidal ideations, homicidal ideations, or auditory hallucinations. She denies fevers, chills, fatigue, recent infections, weight loss, and night sweats. She has not had any visual changes, and falls. No chest pain, heart palpitations, cough and shortness of breath reported. No reports of GI problems such as vomiting, and diarrhea. She has no reports of blood in stools, dysuria and hematuria.   History reviewed. No pertinent surgical history.  Family History  Problem Relation Age of Onset  . Hypertension Mother   . Sickle cell trait Mother   . Diabetes Father   . Sickle cell trait Father     Social History   Socioeconomic History  . Marital status: Single    Spouse name: Not on file  . Number of children: Not on file  . Years of education: Not on file  . Highest education level: Not on file  Occupational History  . Not on file  Tobacco Use  . Smoking status: Never Smoker  . Smokeless tobacco: Never Used  Substance and Sexual Activity  . Alcohol use: Never  .  Drug use: Never  . Sexual activity: Yes    Birth control/protection: None  Other Topics Concern  . Not on file  Social History Narrative  . Not on file   Social Determinants of Health   Financial Resource Strain:   . Difficulty of Paying Living Expenses: Not on file  Food Insecurity:   . Worried About Programme researcher, broadcasting/film/video in the Last Year: Not on file  . Ran Out of Food in the Last Year: Not on file  Transportation Needs:   . Lack of Transportation (Medical): Not on file  . Lack of Transportation (Non-Medical): Not on file  Physical Activity:   . Days of Exercise per Week: Not on file  . Minutes of Exercise per Session: Not on file  Stress:   . Feeling of Stress : Not on file  Social Connections:   . Frequency of Communication with Friends and Family: Not on file  . Frequency of Social Gatherings with Friends and Family: Not on file  . Attends Religious Services: Not on file  . Active Member of Clubs or Organizations: Not on file  . Attends Banker Meetings: Not on file  . Marital Status: Not on file  Intimate Partner Violence:   . Fear of Current or Ex-Partner: Not on file  . Emotionally Abused: Not on file  . Physically Abused: Not on file  . Sexually  Abused: Not on file    Outpatient Medications Prior to Visit  Medication Sig Dispense Refill  . docusate sodium (COLACE) 100 MG capsule Take 1 capsule (100 mg total) by mouth 2 (two) times daily. 10 capsule 0  . DULoxetine (CYMBALTA) 20 MG capsule TAKE 1 CAPSULE BY MOUTH EVERY DAY 90 capsule 1  . folic acid (FOLVITE) 1 MG tablet Take 1 tablet (1 mg total) by mouth daily. 30 tablet 11  . hydroxyurea (HYDREA) 500 MG capsule Take 4 capsules (2,000 mg total) by mouth daily. May take with food to minimize GI side effects. 120 capsule 5  . ibuprofen (ADVIL) 800 MG tablet Take 1 tablet (800 mg total) by mouth every 8 (eight) hours as needed for headache, mild pain, moderate pain or cramping. 30 tablet 3  . naloxone  (NARCAN) 0.4 MG/ML injection As needed 1 mL 0  . ondansetron (ZOFRAN) 4 MG tablet TAKE 1 TABLET BY MOUTH EVERY 8 HOURS AS NEEDED FOR NAUSEA AND VOMITING 30 tablet 2  . oxyCODONE ER (XTAMPZA ER) 13.5 MG C12A Take 13.5 mg by mouth every 12 (twelve) hours. 60 capsule 0  . [START ON 01/19/2019] Oxycodone HCl 20 MG TABS Take 1 tablet (20 mg total) by mouth every 4 (four) hours as needed (for pain). 90 tablet 0  . prochlorperazine (COMPAZINE) 10 MG tablet Take 1 tablet (10 mg total) by mouth every 6 (six) hours as needed for nausea or vomiting. Take if Ondansetron is not effective. 30 tablet 3  . tiZANidine (ZANAFLEX) 4 MG capsule TAKE 1 CAPSULE (4 MG TOTAL) BY MOUTH 3 (THREE) TIMES DAILY. 90 capsule 1  . vitamin B-12 (CYANOCOBALAMIN) 50 MCG tablet Take 1 tablet (50 mcg total) by mouth daily. 30 tablet 6  . Vitamin D, Ergocalciferol, (DRISDOL) 1.25 MG (50000 UT) CAPS capsule Take 1 capsule (50,000 Units total) by mouth every 7 (seven) days. 5 capsule 6  . voxelotor (OXBRYTA) 500 MG TABS tablet Take 1,500 mg by mouth daily. For Sickle Cell Anemia     No facility-administered medications prior to visit.    No Known Allergies  ROS Review of Systems  Constitutional: Negative.   HENT: Negative.   Eyes: Negative.   Respiratory: Negative.   Cardiovascular: Negative.   Gastrointestinal: Positive for abdominal distention (occasional ), constipation (occasional ) and nausea (occasional ).  Endocrine: Negative.   Genitourinary: Negative.   Musculoskeletal: Positive for arthralgias (generalized).  Skin: Negative.   Allergic/Immunologic: Negative.   Neurological: Positive for dizziness (occasional ) and headaches (occasional).  Hematological: Negative.   Psychiatric/Behavioral: Negative.     Objective:    Physical Exam  Constitutional: She is oriented to person, place, and time. She appears well-developed and well-nourished.  HENT:  Head: Normocephalic and atraumatic.  Eyes: Conjunctivae are  normal.  Cardiovascular: Normal rate, regular rhythm, normal heart sounds and intact distal pulses.  Pulmonary/Chest: Effort normal and breath sounds normal.  Abdominal: Soft. Bowel sounds are normal. She exhibits distension (obese).  Musculoskeletal:        General: Normal range of motion.     Cervical back: Normal range of motion and neck supple.  Neurological: She is alert and oriented to person, place, and time. She has normal reflexes.  Skin: Skin is warm.  Psychiatric: She has a normal mood and affect. Her behavior is normal. Judgment and thought content normal.  Nursing note and vitals reviewed.   BP 134/78   Pulse 91   Temp 98.6 F (37 C) (Oral)  Ht 5\' 5"  (1.651 m)   Wt 263 lb 12.8 oz (119.7 kg)   SpO2 97%   BMI 43.90 kg/m  Wt Readings from Last 3 Encounters:  01/18/19 263 lb 12.8 oz (119.7 kg)  12/22/18 259 lb 14.8 oz (117.9 kg)  11/15/18 271 lb 1.6 oz (123 kg)     Health Maintenance Due  Topic Date Due  . TETANUS/TDAP  01/12/2002  . PAP SMEAR-Modifier  01/13/2004  . INFLUENZA VACCINE  09/04/2018    There are no preventive care reminders to display for this patient.  Lab Results  Component Value Date   TSH 1.120 11/15/2018   Lab Results  Component Value Date   WBC 11.9 (H) 12/22/2018   HGB 10.6 (L) 12/22/2018   HCT 31.0 (L) 12/22/2018   MCV 74.9 (L) 12/22/2018   PLT 223 12/22/2018   Lab Results  Component Value Date   NA 139 12/22/2018   K 3.8 12/22/2018   CO2 26 12/22/2018   GLUCOSE 96 12/22/2018   BUN 10 12/22/2018   CREATININE 0.70 12/22/2018   BILITOT 0.8 12/22/2018   ALKPHOS 70 12/22/2018   AST 20 12/22/2018   ALT 24 12/22/2018   PROT 7.1 12/22/2018   ALBUMIN 3.8 12/22/2018   CALCIUM 9.3 12/22/2018   ANIONGAP 8 12/22/2018   Lab Results  Component Value Date   CHOL 84 (L) 11/15/2018   Lab Results  Component Value Date   HDL 33 (L) 11/15/2018   Lab Results  Component Value Date   LDLCALC 34 11/15/2018   Lab Results    Component Value Date   TRIG 84 11/15/2018   Lab Results  Component Value Date   CHOLHDL 2.5 11/15/2018   Lab Results  Component Value Date   HGBA1C 4.1 09/11/2017      Assessment & Plan:   1. Hospital discharge follow-up  2. Hb-SS disease without crisis [facility]) She is doing well today. She will continue to take pain medications as prescribed; will continue to avoid extreme heat and cold; will continue to eat a healthy diet and drink at least 64 ounces of water daily; continue stool softener as needed; will avoid colds and flu; will continue to get plenty of sleep and rest; will continue to avoid high stressful situations and remain infection free; will continue Folic Acid 1 mg daily to avoid sickle cell crisis. Continue to follow up with Hematologist as needed.  - Urinalysis Dipstick - [id number] 11+Oxyco+Alc+Crt-Bund  3. Chronic, continuous use of opioids We will assess urine drug screen today.  - [id number] 11+Oxyco+Alc+Crt-Bund  4. Chronic pain syndrome  5. Nausea Stable today.   6. Anxiety Mild.   7. Follow up She will follow up in 2 months. She has follow up appointment with our social worker Manuela Schwartz after office appointment with me.   No orders of the defined types were placed in this encounter.   Orders Placed This Encounter  Procedures  . [id number] 11+Oxyco+Alc+Crt-Bund  . Urinalysis Dipstick    Referral Orders  No referral(s) requested today      Problem List Items Addressed This Visit      Other   Anxiety   Chronic pain syndrome   Chronic, continuous use of opioids   Relevant Orders   [id number] 11+Oxyco+Alc+Crt-Bund   Hb-SS disease without crisis [facility]) - Primary   Relevant Orders   Urinalysis Dipstick (Completed)   [id number] 11+Oxyco+Alc+Crt-Bund   Nausea    Other Visit Diagnoses    Hospital discharge follow-up  Follow up          No orders of the defined types were placed in this encounter.   Follow-up: No follow-ups on file.    Kallie Locks, FNP

## 2019-01-22 LAB — DRUG SCREEN 764883 11+OXYCO+ALC+CRT-BUND
Amphetamines, Urine: NEGATIVE ng/mL
BENZODIAZ UR QL: NEGATIVE ng/mL
Barbiturate: NEGATIVE ng/mL
Cannabinoid Quant, Ur: NEGATIVE ng/mL
Cocaine (Metabolite): NEGATIVE ng/mL
Creatinine: 128.5 mg/dL (ref 20.0–300.0)
Ethanol: NEGATIVE %
Meperidine: NEGATIVE ng/mL
Methadone Screen, Urine: NEGATIVE ng/mL
Phencyclidine: NEGATIVE ng/mL
Propoxyphene: NEGATIVE ng/mL
Tramadol: NEGATIVE ng/mL
pH, Urine: 5.7 (ref 4.5–8.9)

## 2019-01-22 LAB — OXYCODONE/OXYMORPHONE, CONFIRM
OXYCODONE/OXYMORPH: POSITIVE — AB
OXYCODONE: 2577 ng/mL
OXYCODONE: POSITIVE — AB
OXYMORPHONE (GC/MS): >3000 ng/mL
OXYMORPHONE: POSITIVE — AB

## 2019-01-22 LAB — OPIATES CONFIRMATION, URINE: Opiates: NEGATIVE ng/mL

## 2019-01-31 ENCOUNTER — Telehealth: Payer: Self-pay | Admitting: Family Medicine

## 2019-02-01 ENCOUNTER — Telehealth: Payer: Self-pay | Admitting: Family Medicine

## 2019-02-01 ENCOUNTER — Other Ambulatory Visit: Payer: Self-pay | Admitting: Family Medicine

## 2019-02-01 DIAGNOSIS — M62838 Other muscle spasm: Secondary | ICD-10-CM

## 2019-02-01 DIAGNOSIS — G894 Chronic pain syndrome: Secondary | ICD-10-CM

## 2019-02-01 DIAGNOSIS — F1190 Opioid use, unspecified, uncomplicated: Secondary | ICD-10-CM

## 2019-02-01 DIAGNOSIS — D571 Sickle-cell disease without crisis: Secondary | ICD-10-CM

## 2019-02-01 MED ORDER — IBUPROFEN 800 MG PO TABS: 800.0000 mg | ORAL_TABLET | Freq: Three times a day (TID) | ORAL | 3 refills | Status: DC | PRN

## 2019-02-01 MED ORDER — OXYCODONE HCL 20 MG PO TABS: 20.0000 mg | ORAL_TABLET | ORAL | 0 refills | Status: DC | PRN

## 2019-02-01 NOTE — Telephone Encounter (Signed)
done

## 2019-02-02 NOTE — Telephone Encounter (Signed)
Done

## 2019-02-07 ENCOUNTER — Other Ambulatory Visit: Payer: Self-pay | Admitting: Family Medicine

## 2019-02-07 ENCOUNTER — Telehealth: Payer: Self-pay | Admitting: Family Medicine

## 2019-02-07 DIAGNOSIS — D571 Sickle-cell disease without crisis: Secondary | ICD-10-CM

## 2019-02-07 DIAGNOSIS — F1190 Opioid use, unspecified, uncomplicated: Secondary | ICD-10-CM

## 2019-02-07 DIAGNOSIS — G894 Chronic pain syndrome: Secondary | ICD-10-CM

## 2019-02-07 MED ORDER — XTAMPZA ER 13.5 MG PO C12A: 13.5000 mg | EXTENDED_RELEASE_CAPSULE | Freq: Two times a day (BID) | ORAL | 0 refills | Status: DC

## 2019-02-08 NOTE — Telephone Encounter (Signed)
Done

## 2019-02-11 ENCOUNTER — Other Ambulatory Visit: Payer: Self-pay

## 2019-02-11 ENCOUNTER — Encounter (HOSPITAL_COMMUNITY): Payer: Self-pay | Admitting: Emergency Medicine

## 2019-02-11 ENCOUNTER — Telehealth (HOSPITAL_COMMUNITY): Payer: Self-pay | Admitting: General Practice

## 2019-02-11 ENCOUNTER — Inpatient Hospital Stay (HOSPITAL_COMMUNITY)
Admission: EM | Admit: 2019-02-11 | Discharge: 2019-02-16 | DRG: 812 | Disposition: A | Discharge status: Home / Self Care | Payer: Medicaid Other | Attending: Internal Medicine | Admitting: Internal Medicine

## 2019-02-11 DIAGNOSIS — Z79891 Long term (current) use of opiate analgesic: Secondary | ICD-10-CM | POA: Diagnosis not present

## 2019-02-11 DIAGNOSIS — D638 Anemia in other chronic diseases classified elsewhere: Secondary | ICD-10-CM | POA: Diagnosis present

## 2019-02-11 DIAGNOSIS — R509 Fever, unspecified: Secondary | ICD-10-CM

## 2019-02-11 DIAGNOSIS — Z03818 Encounter for observation for suspected exposure to other biological agents ruled out: Secondary | ICD-10-CM | POA: Diagnosis not present

## 2019-02-11 DIAGNOSIS — R000 Tachycardia, unspecified: Secondary | ICD-10-CM

## 2019-02-11 DIAGNOSIS — G894 Chronic pain syndrome: Secondary | ICD-10-CM | POA: Diagnosis not present

## 2019-02-11 DIAGNOSIS — D5700 Hb-SS disease with crisis, unspecified: Secondary | ICD-10-CM | POA: Diagnosis not present

## 2019-02-11 DIAGNOSIS — D72829 Elevated white blood cell count, unspecified: Secondary | ICD-10-CM

## 2019-02-11 DIAGNOSIS — Z20822 Contact with and (suspected) exposure to covid-19: Secondary | ICD-10-CM | POA: Diagnosis present

## 2019-02-11 DIAGNOSIS — Z6841 Body Mass Index (BMI) 40.0 and over, adult: Secondary | ICD-10-CM | POA: Diagnosis not present

## 2019-02-11 DIAGNOSIS — E538 Deficiency of other specified B group vitamins: Secondary | ICD-10-CM | POA: Diagnosis present

## 2019-02-11 DIAGNOSIS — Z8249 Family history of ischemic heart disease and other diseases of the circulatory system: Secondary | ICD-10-CM

## 2019-02-11 DIAGNOSIS — Z791 Long term (current) use of non-steroidal anti-inflammatories (NSAID): Secondary | ICD-10-CM

## 2019-02-11 DIAGNOSIS — Z832 Family history of diseases of the blood and blood-forming organs and certain disorders involving the immune mechanism: Secondary | ICD-10-CM

## 2019-02-11 DIAGNOSIS — Z79899 Other long term (current) drug therapy: Secondary | ICD-10-CM

## 2019-02-11 DIAGNOSIS — Z833 Family history of diabetes mellitus: Secondary | ICD-10-CM

## 2019-02-11 DIAGNOSIS — E669 Obesity, unspecified: Secondary | ICD-10-CM | POA: Diagnosis present

## 2019-02-11 LAB — CBC WITH DIFFERENTIAL/PLATELET
Abs Immature Granulocytes: 0.38 10*3/uL — ABNORMAL HIGH (ref 0.00–0.07)
Basophils Absolute: 0.1 10*3/uL (ref 0.0–0.1)
Basophils Relative: 0 %
Eosinophils Absolute: 0.2 10*3/uL (ref 0.0–0.5)
Eosinophils Relative: 1 %
HCT: 34.8 % — ABNORMAL LOW (ref 36.0–46.0)
Hemoglobin: 11.7 g/dL — ABNORMAL LOW (ref 12.0–15.0)
Immature Granulocytes: 2 %
Lymphocytes Relative: 16 %
Lymphs Abs: 2.8 10*3/uL (ref 0.7–4.0)
MCH: 24.4 pg — ABNORMAL LOW (ref 26.0–34.0)
MCHC: 33.6 g/dL (ref 30.0–36.0)
MCV: 72.5 fL — ABNORMAL LOW (ref 80.0–100.0)
Monocytes Absolute: 1.5 10*3/uL — ABNORMAL HIGH (ref 0.1–1.0)
Monocytes Relative: 9 %
Neutro Abs: 12.6 10*3/uL — ABNORMAL HIGH (ref 1.7–7.7)
Neutrophils Relative %: 72 %
Platelets: 180 10*3/uL (ref 150–400)
RBC: 4.8 MIL/uL (ref 3.87–5.11)
RDW: 14.5 % (ref 11.5–15.5)
WBC: 17.6 10*3/uL — ABNORMAL HIGH (ref 4.0–10.5)
nRBC: 1 % — ABNORMAL HIGH (ref 0.0–0.2)

## 2019-02-11 LAB — COMPREHENSIVE METABOLIC PANEL
ALT: 25 U/L (ref 0–44)
AST: 25 U/L (ref 15–41)
Albumin: 4.5 g/dL (ref 3.5–5.0)
Alkaline Phosphatase: 86 U/L (ref 38–126)
Anion gap: 9 (ref 5–15)
BUN: 9 mg/dL (ref 6–20)
CO2: 27 mmol/L (ref 22–32)
Calcium: 9.6 mg/dL (ref 8.9–10.3)
Chloride: 104 mmol/L (ref 98–111)
Creatinine, Ser: 0.63 mg/dL (ref 0.44–1.00)
GFR calc Af Amer: >60 mL/min (ref >60)
GFR calc non Af Amer: >60 mL/min (ref >60)
Glucose, Bld: 95 mg/dL (ref 70–99)
Potassium: 4.8 mmol/L (ref 3.5–5.1)
Sodium: 140 mmol/L (ref 135–145)
Total Bilirubin: 1.7 mg/dL — ABNORMAL HIGH (ref 0.3–1.2)
Total Protein: 8.1 g/dL (ref 6.5–8.1)

## 2019-02-11 LAB — RETICULOCYTES
Immature Retic Fract: 39.8 % — ABNORMAL HIGH (ref 2.3–15.9)
RBC.: 4.71 MIL/uL (ref 3.87–5.11)
Retic Count, Absolute: 229.4 10*3/uL — ABNORMAL HIGH (ref 19.0–186.0)
Retic Ct Pct: 4.9 % — ABNORMAL HIGH (ref 0.4–3.1)

## 2019-02-11 LAB — RESPIRATORY PANEL BY RT PCR (FLU A&B, COVID)
Influenza A by PCR: NEGATIVE
Influenza B by PCR: NEGATIVE
SARS Coronavirus 2 by RT PCR: NEGATIVE

## 2019-02-11 LAB — I-STAT BETA HCG BLOOD, ED (MC, WL, AP ONLY): I-stat hCG, quantitative: <5 m[IU]/mL (ref <5)

## 2019-02-11 MED ORDER — OXYCODONE ER 13.5 MG PO C12A: 13.5000 mg | EXTENDED_RELEASE_CAPSULE | Freq: Two times a day (BID) | ORAL | Status: DC

## 2019-02-11 MED ORDER — KETOROLAC TROMETHAMINE 15 MG/ML IJ SOLN
15.0000 mg | Freq: Once | INTRAMUSCULAR | Status: AC
  Administered 2019-02-11: 15 mg via INTRAVENOUS
  Filled 2019-02-11: qty 1

## 2019-02-11 MED ORDER — HYDROMORPHONE HCL 2 MG/ML IJ SOLN
2.0000 mg | INTRAMUSCULAR | Status: AC | PRN
  Administered 2019-02-11 (×3): 2 mg via SUBCUTANEOUS
  Filled 2019-02-11 (×3): qty 1

## 2019-02-11 MED ORDER — SENNOSIDES-DOCUSATE SODIUM 8.6-50 MG PO TABS
1.0000 | ORAL_TABLET | Freq: Two times a day (BID) | ORAL | Status: DC
  Administered 2019-02-11 – 2019-02-16 (×10): 1 via ORAL
  Filled 2019-02-11 (×10): qty 1

## 2019-02-11 MED ORDER — NALOXONE HCL 0.4 MG/ML IJ SOLN: 0.4000 mg | INTRAMUSCULAR | Status: DC | PRN

## 2019-02-11 MED ORDER — VOXELOTOR 500 MG PO TABS: 1500.0000 mg | ORAL_TABLET | Freq: Every day | ORAL | Status: DC

## 2019-02-11 MED ORDER — TIZANIDINE HCL 4 MG PO TABS
4.0000 mg | ORAL_TABLET | Freq: Three times a day (TID) | ORAL | Status: DC
  Administered 2019-02-11 – 2019-02-16 (×14): 4 mg via ORAL
  Filled 2019-02-11 (×14): qty 1

## 2019-02-11 MED ORDER — ONDANSETRON HCL 4 MG PO TABS: 4.0000 mg | ORAL_TABLET | ORAL | Status: DC | PRN

## 2019-02-11 MED ORDER — DULOXETINE HCL 20 MG PO CPEP
20.0000 mg | ORAL_CAPSULE | Freq: Every day | ORAL | Status: DC
  Administered 2019-02-11 – 2019-02-16 (×5): 20 mg via ORAL
  Filled 2019-02-11 (×6): qty 1

## 2019-02-11 MED ORDER — DEXTROSE-NACL 5-0.45 % IV SOLN: INTRAVENOUS | Status: DC

## 2019-02-11 MED ORDER — POLYETHYLENE GLYCOL 3350 17 G PO PACK: 17.0000 g | PACK | Freq: Every day | ORAL | Status: DC | PRN

## 2019-02-11 MED ORDER — VITAMIN D (ERGOCALCIFEROL) 1.25 MG (50000 UNIT) PO CAPS
50000.0000 [IU] | ORAL_CAPSULE | ORAL | Status: DC
  Administered 2019-02-11: 50000 [IU] via ORAL
  Filled 2019-02-11: qty 1

## 2019-02-11 MED ORDER — SODIUM CHLORIDE 0.9 % IV SOLN
INTRAVENOUS | Status: DC
  Administered 2019-02-11: 1000 mL via INTRAVENOUS

## 2019-02-11 MED ORDER — ONDANSETRON HCL 4 MG/2ML IJ SOLN
4.0000 mg | Freq: Once | INTRAMUSCULAR | Status: AC
  Administered 2019-02-11: 4 mg via INTRAVENOUS
  Filled 2019-02-11: qty 2

## 2019-02-11 MED ORDER — DIPHENHYDRAMINE HCL 25 MG PO CAPS
25.0000 mg | ORAL_CAPSULE | ORAL | Status: DC | PRN
  Administered 2019-02-11: 15:00:00 25 mg via ORAL
  Filled 2019-02-11: qty 1

## 2019-02-11 MED ORDER — SODIUM CHLORIDE 0.9% FLUSH: 9.0000 mL | INTRAVENOUS | Status: DC | PRN

## 2019-02-11 MED ORDER — OXYCODONE HCL ER 15 MG PO T12A
15.0000 mg | EXTENDED_RELEASE_TABLET | Freq: Two times a day (BID) | ORAL | Status: DC
  Administered 2019-02-11 – 2019-02-16 (×10): 15 mg via ORAL
  Filled 2019-02-11 (×10): qty 1

## 2019-02-11 MED ORDER — HYDROXYZINE HCL 25 MG PO TABS: 25.0000 mg | ORAL_TABLET | ORAL | Status: DC | PRN

## 2019-02-11 MED ORDER — KETOROLAC TROMETHAMINE 30 MG/ML IJ SOLN
30.0000 mg | Freq: Four times a day (QID) | INTRAMUSCULAR | Status: DC
  Administered 2019-02-12 – 2019-02-16 (×18): 30 mg via INTRAVENOUS
  Filled 2019-02-11 (×19): qty 1

## 2019-02-11 MED ORDER — BISACODYL 5 MG PO TBEC: 5.0000 mg | DELAYED_RELEASE_TABLET | Freq: Every day | ORAL | Status: DC | PRN

## 2019-02-11 MED ORDER — ONDANSETRON HCL 4 MG/2ML IJ SOLN: 4.0000 mg | INTRAMUSCULAR | Status: DC | PRN

## 2019-02-11 MED ORDER — FOLIC ACID 1 MG PO TABS
1.0000 mg | ORAL_TABLET | Freq: Every day | ORAL | Status: DC
  Administered 2019-02-12 – 2019-02-16 (×5): 1 mg via ORAL
  Filled 2019-02-11 (×5): qty 1

## 2019-02-11 MED ORDER — ENOXAPARIN SODIUM 40 MG/0.4ML ~~LOC~~ SOLN
40.0000 mg | SUBCUTANEOUS | Status: DC
  Administered 2019-02-11 – 2019-02-15 (×5): 40 mg via SUBCUTANEOUS
  Filled 2019-02-11 (×5): qty 0.4

## 2019-02-11 MED ORDER — HYDROMORPHONE 1 MG/ML IV SOLN
INTRAVENOUS | Status: DC
  Administered 2019-02-11: 30 mg via INTRAVENOUS
  Administered 2019-02-11: 1 mg via INTRAVENOUS
  Administered 2019-02-12: 4 mg via INTRAVENOUS
  Administered 2019-02-12: 0 mg via INTRAVENOUS
  Filled 2019-02-11: qty 30

## 2019-02-11 NOTE — ED Notes (Signed)
Per MD request - pt given sandwich, cheese sticks, and diet ginger ale at this time. No other concerns expressed by the pt.

## 2019-02-11 NOTE — ED Provider Notes (Signed)
Blood pressure (!) 142/67, pulse (!) 110, temperature 99.7 F (37.6 C), resp. rate 18, weight 122 kg, last menstrual period 02/06/2019, SpO2 95 %.  Assuming care from Dr. Juleen China.  In short, Malayah Burch is a 39 y.o. female with a chief complaint of Sickle Cell Pain Crisis .  Refer to the original H&P for additional details.  The current plan of care is to f/u after meds and reassess. Labs near baseline.  05:55 PM  Patient with continued pain. Feels like she will require admit for pain mgmt.   Discussed patient's case with [facility] to request admission. Patient and family (if present) updated with plan. Care transferred to [facility] service.  I reviewed all nursing notes, vitals, pertinent old records, EKGs, labs, imaging (as available).      Maia Plan, MD 02/12/19 [phone]

## 2019-02-11 NOTE — ED Provider Notes (Signed)
[facility] DEPT Provider Note   CSN: [id number] Arrival date & time: 02/11/19  1247     History Chief Complaint  Patient presents with  . Sickle Cell Pain Crisis    Stephanie Burch is a 39 y.o. female.  HPI    39 year old female with bilateral leg pain.  Entire leg but worse in her thighs.  She has a past history of sickle cell anemia.  She attributes her current symptoms to this.  She says that symptoms are typical for her.  She has been taking her home medication without much improvement.  Denies any fevers or chills.  No chest pain or any respiratory complaints.  No rashes or unusual swelling. Past Medical History:  Diagnosis Date  . Nausea   . Sickle cell anemia (HCC)   . Vitamin B12 deficiency 12/2018  . Vitamin D deficiency     Patient Active Problem List   Diagnosis Date Noted  . Anxiety 11/15/2018  . Vitamin D deficiency 11/15/2018  . Nausea 11/15/2018  . Abnormal pulse oximetry   . Hypokalemia 09/20/2018  . Medication management 09/14/2018  . Muscle spasms of both lower extremities 08/25/2018  . Subjective visual disturbance of right eye 08/25/2018  . Hb-SS disease without crisis ([place]) 04/13/2018  . Chronic pain syndrome 03/16/2018  . Chronic, continuous use of opioids 03/16/2018  . Tachycardia with heart rate 100-120 beats per minute   . Sickle cell anemia with crisis ([place]) 10/19/2017  . Leukocytosis 10/19/2017  . Thrombocytopenia ([place]) 10/19/2017  . Morbidly obese ([place]) 10/19/2017  . Sickle cell pain crisis ([place]) 10/15/2017    History reviewed. No pertinent surgical history.   OB History    Gravida  3   Para      Term      Preterm      AB      Living  3     SAB      TAB      Ectopic      Multiple      Live Births              Family History  Problem Relation Age of Onset  . Hypertension Mother   . Sickle cell trait Mother   . Diabetes Father   . Sickle cell trait Father     Social History    Tobacco Use  . Smoking status: Never Smoker  . Smokeless tobacco: Never Used  Substance Use Topics  . Alcohol use: Never  . Drug use: Never    Home Medications Prior to Admission medications   Medication Sig Start Date End Date Taking? Authorizing Provider  DULoxetine (CYMBALTA) 20 MG capsule TAKE 1 CAPSULE BY MOUTH EVERY DAY 10/25/18  Yes Azzie Glatter, FNP  folic acid (FOLVITE) 1 MG tablet Take 1 tablet (1 mg total) by mouth daily. 11/15/18  Yes Azzie Glatter, FNP  ibuprofen (ADVIL) 800 MG tablet Take 1 tablet (800 mg total) by mouth every 8 (eight) hours as needed for headache, mild pain, moderate pain or cramping. 02/01/19  Yes Azzie Glatter, FNP  ondansetron (ZOFRAN) 4 MG tablet TAKE 1 TABLET BY MOUTH EVERY 8 HOURS AS NEEDED FOR NAUSEA AND VOMITING 01/04/19  Yes Azzie Glatter, FNP  oxyCODONE ER [facility]) 13.5 MG C12A Take 13.5 mg by mouth every 12 (twelve) hours. 02/07/19 03/09/19 Yes Azzie Glatter, FNP  Oxycodone HCl 20 MG TABS Take 1 tablet (20 mg total) by mouth every 4 (four)  hours as needed (for pain). 02/01/19  Yes Kallie Locks, FNP  tiZANidine (ZANAFLEX) 4 MG capsule TAKE 1 CAPSULE (4 MG TOTAL) BY MOUTH 3 (THREE) TIMES DAILY. 01/06/19  Yes Kallie Locks, FNP  Vitamin D, Ergocalciferol, (DRISDOL) 1.25 MG (50000 UT) CAPS capsule Take 1 capsule (50,000 Units total) by mouth every 7 (seven) days. 12/21/18  Yes Kallie Locks, FNP  voxelotor (OXBRYTA) 500 MG TABS tablet Take 1,500 mg by mouth daily. For Sickle Cell Anemia   Yes [provider]  docusate sodium (COLACE) 100 MG capsule Take 1 capsule (100 mg total) by mouth 2 (two) times daily. Patient not taking: Reported on 02/11/2019 09/10/18   Kallie Locks, FNP  hydroxyurea (HYDREA) 500 MG capsule Take 4 capsules (2,000 mg total) by mouth daily. May take with food to minimize GI side effects. Patient not taking: Reported on 02/11/2019 11/15/18   Kallie Locks, FNP  naloxone [facility]) 0.4  MG/ML injection As needed Patient not taking: Reported on 02/11/2019 09/14/18   Kallie Locks, FNP  prochlorperazine (COMPAZINE) 10 MG tablet Take 1 tablet (10 mg total) by mouth every 6 (six) hours as needed for nausea or vomiting. Take if Ondansetron is not effective. Patient not taking: Reported on 02/11/2019 11/15/18   Kallie Locks, FNP  vitamin B-12 (CYANOCOBALAMIN) 50 MCG tablet Take 1 tablet (50 mcg total) by mouth daily. Patient not taking: Reported on 02/11/2019 12/21/18   Kallie Locks, FNP    Allergies    Patient has no known allergies.  Review of Systems   Review of Systems All systems reviewed and negative, other than as noted in HPI.  Physical Exam Updated Vital Signs BP (!) 142/67   Pulse (!) 110   Temp 99.7 F (37.6 C)   Resp 18   Wt 122 kg   LMP 02/06/2019   SpO2 95%   BMI 44.76 kg/m   Physical Exam Vitals and nursing note reviewed.  Constitutional:      General: She is not in acute distress.    Appearance: She is well-developed. She is obese.  HENT:     Head: Normocephalic and atraumatic.  Eyes:     General:        Right eye: No discharge.        Left eye: No discharge.     Conjunctiva/sclera: Conjunctivae normal.  Cardiovascular:     Rate and Rhythm: Normal rate and regular rhythm.     Heart sounds: Normal heart sounds. No murmur. No friction rub. No gallop.   Pulmonary:     Effort: Pulmonary effort is normal. No respiratory distress.     Breath sounds: Normal breath sounds.  Abdominal:     General: There is no distension.     Palpations: Abdomen is soft.     Tenderness: There is no abdominal tenderness.  Musculoskeletal:        General: No tenderness.     Cervical back: Neck supple.  Skin:    General: Skin is warm and dry.  Neurological:     Mental Status: She is alert.  Psychiatric:        Behavior: Behavior normal.        Thought Content: Thought content normal.     ED Results / Procedures / Treatments   Labs (all labs  ordered are listed, but only abnormal results are displayed) Labs Reviewed  COMPREHENSIVE METABOLIC PANEL - Abnormal; Notable for the following components:      Result  Value   Total Bilirubin 1.7 (*)    All other components within normal limits  CBC WITH DIFFERENTIAL/PLATELET - Abnormal; Notable for the following components:   WBC 17.6 (*)    Hemoglobin 11.7 (*)    HCT 34.8 (*)    MCV 72.5 (*)    MCH 24.4 (*)    nRBC 1.0 (*)    Neutro Abs 12.6 (*)    Monocytes Absolute 1.5 (*)    Abs Immature Granulocytes 0.38 (*)    All other components within normal limits  RETICULOCYTES - Abnormal; Notable for the following components:   Retic Ct Pct 4.9 (*)    Retic Count, Absolute 229.4 (*)    Immature Retic Fract 39.8 (*)    All other components within normal limits  RESPIRATORY PANEL BY RT PCR (FLU A&B, COVID)  I-STAT BETA HCG BLOOD, ED ([facility], [facility], [facility] ONLY)    EKG None  Radiology No results found.  Procedures Procedures (including critical care time)  Medications Ordered in ED Medications  0.9 %  sodium chloride infusion (1,000 mLs Intravenous New Bag/Given 02/11/19 1526)  diphenhydrAMINE (BENADRYL) capsule 25 mg (25 mg Oral Given 02/11/19 1529)  ketorolac (TORADOL) 15 MG/ML injection 15 mg (15 mg Intravenous Given 02/11/19 1522)  HYDROmorphone (DILAUDID) injection 2 mg (2 mg Subcutaneous Given 02/11/19 1636)  ondansetron (ZOFRAN) injection 4 mg (4 mg Intravenous Given 02/11/19 1522)    ED Course  I have reviewed the triage vital signs and the nursing notes.  Pertinent labs & imaging results that were available during my care of the patient were reviewed by me and considered in my medical decision making (see chart for details).    MDM Rules/Calculators/A&P                      39 year old female with atraumatic lower extremity pain.  Consistent with prior symptoms of vaso-occlusive sickle cell pain.  No other acute complaints.  Mildly tachycardic, otherwise exam is fairly unremarkable.   Labs without any significant acute abnormalities in light of her known sickle cell anemia.  She is afebrile.  Nontoxic.  Plan symptomatic treatment and reassessment.  I doubt emergent complication of sickle cell anemia or otherwise.  Final Clinical Impression(s) / ED Diagnoses Final diagnoses:  Sickle cell pain crisis [facility])    Rx / DC Orders ED Discharge Orders    None       Raeford Razor, MD 02/11/19 1651

## 2019-02-11 NOTE — ED Notes (Signed)
Attempt once to collect labs unsuccessful  

## 2019-02-11 NOTE — ED Notes (Signed)
Charge RN attempted to stick x1 for IV and blood work

## 2019-02-11 NOTE — Telephone Encounter (Signed)
Patient called, complained of pain in the legs rated at 10/10. Denied chest pain, fever, diarrhea, abdominal pain. Endorsed nausea, vomited "last night". Screened negative for Covid-19 symptoms. Admitted to having means of transportation without driving self after treatment. Last took oxycodone 20 mg at 04:00 am today. Per provider, due to transportation issues, based on the time the patient envisaged getting to the [facility], there will not be  enough time to get her pain under control. So, patient told to go to the nearest ER for treatment.. Patient notified, verbalized understanding.

## 2019-02-11 NOTE — H&P (Signed)
Stephanie Burch WNI:[id number] DOB: 19-Nov-1980 DOA: 02/11/2019     PCP: Azzie Glatter, FNP   Outpatient Specialists:     Patient arrived to ER on 02/11/19 at 1247  Patient coming from: home Lives   With family    Chief Complaint:   Chief Complaint  Patient presents with  . Sickle Cell Pain Crisis    HPI: Stephanie Burch is a 39 y.o. female with medical history significant of sickle cell anemia, vitamin B12 deficiency chronic pain, tachycardia chronic, leukocytosis    Presented with leg pain typical of sickle cell crisis Patient with known history of sickle cell on Voxelotor  chronic pain medications Presents today with bilateral lower extremity pain No associated fevers no chills no chest pain or shortness of breath.  Last time she was evaluated for the same on 15 December and was treated as an outpatient Oxycodone ER has been the last time refusal on the floor of January she was given 60 capsules 13.5 mg to take every 12 hours. The oxycodone was refilled on 30 December she was given 90 tablets 20 mg each to take every 4 hours as needed. Patient states she has been taking it extra and worried she is about to run out states her normal pain medications have not been helpful  Infectious risk factors:  Reports none    In  ER  PCR testing  NEGATIVE     Lab Results  Component Value Date   SARSCOV2NAA NEGATIVE 02/11/2019   [place] NEGATIVE 12/21/2018   [place] NEGATIVE 11/07/2018   [place] NEGATIVE 09/20/2018     Regarding pertinent Chronic problems:   hx of sickle cell anemia on Voxelotor  While in ER:   WBC 17 up from prior Hemoglobin 11.7 which is stable for her With 2% count 4.9 Total bilirubin 1.7 Was treated with  Dilaudid but continues to have symptoms hospitalist called for admission for sickle cell exacerbation  The following Work up has been ordered so far:  Orders Placed This Encounter  Procedures  . Respiratory Panel by RT PCR (Flu  A&B, Covid) - Nasopharyngeal Swab  . Comprehensive metabolic panel  . CBC with Differential  . Reticulocytes  . Monitor O2 SATs  . Document Actual / Estimated Weight  . Saline Lock IV, Maintain IV access  . If O2 Sat <94% administer O2 at 2 liters/minute via nasal cannula  . Patient may eat/drink  . Consult to hospitalist  ALL PATIENTS BEING ADMITTED/HAVING PROCEDURES NEED COVID-19 SCREENING  . I-Stat beta hCG blood, ED     Following Medications were ordered in ER: Medications  0.9 %  sodium chloride infusion (1,000 mLs Intravenous New Bag/Given 02/11/19 1526)  diphenhydrAMINE (BENADRYL) capsule 25 mg (25 mg Oral Given 02/11/19 1529)  ketorolac (TORADOL) 15 MG/ML injection 15 mg (15 mg Intravenous Given 02/11/19 1522)  HYDROmorphone (DILAUDID) injection 2 mg (2 mg Subcutaneous Given 02/11/19 1636)  ondansetron (ZOFRAN) injection 4 mg (4 mg Intravenous Given 02/11/19 1522)        Consult Orders  (From admission, onward)         Start     Ordered   02/11/19 1756  Consult to hospitalist  ALL PATIENTS BEING ADMITTED/HAVING PROCEDURES NEED COVID-19 SCREENING  Once    Comments: ALL PATIENTS BEING ADMITTED/HAVING PROCEDURES NEED COVID-19 SCREENING  Provider:  (Not yet assigned)  Question Answer Comment  Place call to: [facility]   Reason for Consult Admit      02/11/19 1755  Significant initial  Findings: Abnormal Labs Reviewed  COMPREHENSIVE METABOLIC PANEL - Abnormal; Notable for the following components:      Result Value   Total Bilirubin 1.7 (*)    All other components within normal limits  CBC WITH DIFFERENTIAL/PLATELET - Abnormal; Notable for the following components:   WBC 17.6 (*)    Hemoglobin 11.7 (*)    HCT 34.8 (*)    MCV 72.5 (*)    MCH 24.4 (*)    nRBC 1.0 (*)    Neutro Abs 12.6 (*)    Monocytes Absolute 1.5 (*)    Abs Immature Granulocytes 0.38 (*)    All other components within normal limits  RETICULOCYTES - Abnormal; Notable for the following  components:   Retic Ct Pct 4.9 (*)    Retic Count, Absolute 229.4 (*)    Immature Retic Fract 39.8 (*)    All other components within normal limits     Otherwise labs showing:    Recent Labs  Lab 02/11/19 1510  NA 140  K 4.8  CO2 27  GLUCOSE 95  BUN 9  CREATININE 0.63  CALCIUM 9.6    Cr    stable,    Lab Results  Component Value Date   CREATININE 0.63 02/11/2019   CREATININE 0.70 12/22/2018   CREATININE 0.64 12/21/2018    Recent Labs  Lab 02/11/19 1510  AST 25  ALT 25  ALKPHOS 86  BILITOT 1.7*  PROT 8.1  ALBUMIN 4.5   Lab Results  Component Value Date   CALCIUM 9.6 02/11/2019   PHOS 5.8 (H) 09/20/2018     WBC      Component Value Date/Time   WBC 17.6 (H) 02/11/2019 1510   ANC    Component Value Date/Time   NEUTROABS 12.6 (H) 02/11/2019 1510   NEUTROABS 3.9 09/11/2017 1431   ALC No components found for: LYMPHAB    Plt: Lab Results  Component Value Date   PLT 180 02/11/2019      COVID-19 Labs  No results for input(s): DDIMER, FERRITIN, LDH, CRP in the last 72 hours.  Lab Results  Component Value Date   SARSCOV2NAA NEGATIVE 02/11/2019   SARSCOV2NAA NEGATIVE 12/21/2018   SARSCOV2NAA NEGATIVE 11/07/2018   SARSCOV2NAA NEGATIVE 09/20/2018    HG/HCT  Down     Component Value Date/Time   HGB 11.7 (L) 02/11/2019 1510   HGB 13.0 09/11/2017 1431   HCT 34.8 (L) 02/11/2019 1510   HCT 38.4 09/11/2017 1431      ECG: not Ordered       UA  not ordered      ED Triage Vitals  Enc Vitals Group     BP 02/11/19 1304 (!) 153/109     Pulse Rate 02/11/19 1304 (!) 118     Resp 02/11/19 1304 20     Temp 02/11/19 1304 99.7 F (37.6 C)     Temp src --      SpO2 02/11/19 1304 98 %     Weight 02/11/19 1452 268 lb 15.4 oz (122 kg)     Height --      Head Circumference --      Peak Flow --      Pain Score 02/11/19 1302 10     Pain Loc --      Pain Edu? --      Excl. in GC? --   TMAX(24)@       Latest  Blood pressure (!) 145/98, pulse (!)  113, temperature  99.7 F (37.6 C), resp. rate 18, weight 122 kg, last menstrual period 02/06/2019, SpO2 98 %.     Hospitalist was called for admission for sickle cell pain crisis   Review of Systems:    Pertinent positives include:  Fatigue, leg pain  Constitutional:  No weight loss, night sweats, Fevers, chills, weight loss  HEENT:  No headaches, Difficulty swallowing,Tooth/dental problems,Sore throat,  No sneezing, itching, ear ache, nasal congestion, post nasal drip,  Cardio-vascular:  No chest pain, Orthopnea, PND, anasarca, dizziness, palpitations.no Bilateral lower extremity swelling  GI:  No heartburn, indigestion, abdominal pain, nausea, vomiting, diarrhea, change in bowel habits, loss of appetite, melena, blood in stool, hematemesis Resp:  no shortness of breath at rest. No dyspnea on exertion, No excess mucus, no productive cough, No non-productive cough, No coughing up of blood.No change in color of mucus.No wheezing. Skin:  no rash or lesions. No jaundice GU:  no dysuria, change in color of urine, no urgency or frequency. No straining to urinate.  No flank pain.  Musculoskeletal:  No joint pain or no joint swelling. No decreased range of motion. No back pain.  Psych:  No change in mood or affect. No depression or anxiety. No memory loss.  Neuro: no localizing neurological complaints, no tingling, no weakness, no double vision, no gait abnormality, no slurred speech, no confusion  All systems reviewed and apart from HOPI all are negative  Past Medical History:   Past Medical History:  Diagnosis Date  . Nausea   . Sickle cell anemia (HCC)   . Vitamin B12 deficiency 12/2018  . Vitamin D deficiency       History reviewed. No pertinent surgical history.  Social History:  Ambulatory  Independently      reports that she has never smoked. She has never used smokeless tobacco. She reports that she does not drink alcohol or use drugs.     Family History:     Family History  Problem Relation Age of Onset  . Hypertension Mother   . Sickle cell trait Mother   . Diabetes Father   . Sickle cell trait Father     Allergies: No Known Allergies   Prior to Admission medications   Medication Sig Start Date End Date Taking? Authorizing Provider  DULoxetine (CYMBALTA) 20 MG capsule TAKE 1 CAPSULE BY MOUTH EVERY DAY 10/25/18  Yes Kallie Locks, FNP  folic acid (FOLVITE) 1 MG tablet Take 1 tablet (1 mg total) by mouth daily. 11/15/18  Yes Kallie Locks, FNP  ibuprofen (ADVIL) 800 MG tablet Take 1 tablet (800 mg total) by mouth every 8 (eight) hours as needed for headache, mild pain, moderate pain or cramping. 02/01/19  Yes Kallie Locks, FNP  ondansetron (ZOFRAN) 4 MG tablet TAKE 1 TABLET BY MOUTH EVERY 8 HOURS AS NEEDED FOR NAUSEA AND VOMITING 01/04/19  Yes Kallie Locks, FNP  oxyCODONE ER [facility] ER) 13.5 MG C12A Take 13.5 mg by mouth every 12 (twelve) hours. 02/07/19 03/09/19 Yes Kallie Locks, FNP  Oxycodone HCl 20 MG TABS Take 1 tablet (20 mg total) by mouth every 4 (four) hours as needed (for pain). 02/01/19  Yes Kallie Locks, FNP  tiZANidine (ZANAFLEX) 4 MG capsule TAKE 1 CAPSULE (4 MG TOTAL) BY MOUTH 3 (THREE) TIMES DAILY. 01/06/19  Yes Kallie Locks, FNP  Vitamin D, Ergocalciferol, (DRISDOL) 1.25 MG (50000 UT) CAPS capsule Take 1 capsule (50,000 Units total) by mouth every 7 (seven) days. 12/21/18  Yes Bradly Chris,  Rolm Gala, FNP  voxelotor (OXBRYTA) 500 MG TABS tablet Take 1,500 mg by mouth daily. For Sickle Cell Anemia   Yes [provider]  docusate sodium (COLACE) 100 MG capsule Take 1 capsule (100 mg total) by mouth 2 (two) times daily. Patient not taking: Reported on 02/11/2019 09/10/18   Kallie Locks, FNP  hydroxyurea (HYDREA) 500 MG capsule Take 4 capsules (2,000 mg total) by mouth daily. May take with food to minimize GI side effects. Patient not taking: Reported on 02/11/2019 11/15/18   Kallie Locks, FNP   naloxone [facility]) 0.4 MG/ML injection As needed Patient not taking: Reported on 02/11/2019 09/14/18   Kallie Locks, FNP  prochlorperazine (COMPAZINE) 10 MG tablet Take 1 tablet (10 mg total) by mouth every 6 (six) hours as needed for nausea or vomiting. Take if Ondansetron is not effective. Patient not taking: Reported on 02/11/2019 11/15/18   Kallie Locks, FNP  vitamin B-12 (CYANOCOBALAMIN) 50 MCG tablet Take 1 tablet (50 mcg total) by mouth daily. Patient not taking: Reported on 02/11/2019 12/21/18   Kallie Locks, FNP   Physical Exam: Blood pressure (!) 145/98, pulse (!) 113, temperature 99.7 F (37.6 C), resp. rate 18, weight 122 kg, last menstrual period 02/06/2019, SpO2 98 %. 1. General:  in No  Acute distress   Chronically ill -appearing 2. Psychological: Alert and   Oriented 3. Head/ENT:     Dry Mucous Membranes                          Head Non traumatic, neck supple                            Poor Dentition 4. SKIN:  decreased Skin turgor,  Skin clean Dry and intact no rash 5. Heart: Regular rate and rhythm no  Murmur, no Rub or gallop 6. Lungs Clear to auscultation bilaterally, no wheezes or crackles   7. Abdomen: Soft,  non-tender, Non distended obese  bowel sounds present 8. Lower extremities: no clubbing, cyanosis, no  edema 9. Neurologically Grossly intact, moving all 4 extremities equally   10. MSK: Normal range of motion   All other LABS:     Recent Labs  Lab 02/11/19 1510  WBC 17.6*  NEUTROABS 12.6*  HGB 11.7*  HCT 34.8*  MCV 72.5*  PLT 180     Recent Labs  Lab 02/11/19 1510  NA 140  K 4.8  CL 104  CO2 27  GLUCOSE 95  BUN 9  CREATININE 0.63  CALCIUM 9.6     Recent Labs  Lab 02/11/19 1510  AST 25  ALT 25  ALKPHOS 86  BILITOT 1.7*  PROT 8.1  ALBUMIN 4.5       Cultures:    Component Value Date/Time   SDES  12/21/2018 0954    URINE, CLEAN CATCH Performed at [facility], [address] [place], [place]  [address]    SPECREQUEST  12/21/2018 1572    NONE Performed at [facility], [address] [place], [place] [address]    CULT MULTIPLE SPECIES PRESENT, SUGGEST RECOLLECTION (A) 12/21/2018 0954   REPTSTATUS 12/22/2018 FINAL 12/21/2018 0954     Radiological Exams on Admission: No results found.  Chart has been reviewed    Assessment/Plan   39 y.o. female with medical history significant of sickle cell anemia, vitamin B12 deficiency chronic pain, tachycardia chronic,  leukocytosis Admitted for sickle cell pain crisis  Present on Admission: . Sickle cell pain crisis (HCC) - - will admit per sickle cell protocol,    control pain,    hydrate with IVF D5 .45% Saline @ 100 mls/hour,    Weight based Dilaudid PCA for opioid tolerant patients.    Continue  Voxelotor and folic acid   Transfuse as needed if Hg drops significantly below baseline.    No evidence of acute chest at this time   Sickle cell team to take over management in AM  . Leukocytosis no evidence of infectious process suspect most likely reactive secondary to sickle cell pain crisis  . Tachycardia with heart rate 100-120 beats per minute story of chronic tachycardia continue to rehydrate and follow observe in telemetry  . Chronic pain syndrome continue chronic pain medications   Other plan as per orders.  DVT prophylaxis:    Lovenox     Code Status:  FULL CODE   as per patient   I had personally discussed CODE STATUS with patient   Family Communication:   Family not at  Bedside    Disposition Plan:   To home once workup is complete and patient is stable    Consults called: none  Admission status:  ED Disposition    ED Disposition Condition Comment   Admit  The patient appears reasonably stabilized for admission considering the current resources, flow, and capabilities available in the ED at this time, and I doubt any other [facility] requiring further screening and/or treatment in the ED prior to  admission is  present.         inpatient     Expect 2 midnight stay secondary to severity of patient's current illness including   hemodynamic instability despite optimal treatment (tachycardia )  Severe lab/radiological/exam abnormalities including:   Leukocytosis  and extensive comorbidities including:  Chronic pain    Obesity   sickle cell disease    That are currently affecting medical management.   I expect  patient to be hospitalized for 2 midnights requiring inpatient medical care.  Patient is at high risk for adverse outcome (such as loss of life or disability) if not treated.  Indication for inpatient stay as follows:    severe pain requiring acute inpatient management,      Need for   IV fluids,  IV pain medications     Level of care    tele  For 12H   Precautions: admitted as  Covid Negative  No active isolations     PPE: Used by the provider:   P100  eye Goggles,  Gloves     Genna Casimir 02/11/2019, 9:30 PM    [facility]     after 2 AM please page floor coverage PA If 7AM-7PM, please contact the day team taking care of the patient using [email or web address]

## 2019-02-11 NOTE — ED Notes (Signed)
RN attempted to stick x1 for IV and blood work

## 2019-02-11 NOTE — ED Triage Notes (Signed)
Patient c/o bilateral leg pain with hx of sickle cell since yesterday.

## 2019-02-12 ENCOUNTER — Other Ambulatory Visit: Payer: Self-pay

## 2019-02-12 ENCOUNTER — Encounter (HOSPITAL_COMMUNITY): Payer: Self-pay | Admitting: Internal Medicine

## 2019-02-12 DIAGNOSIS — R509 Fever, unspecified: Secondary | ICD-10-CM

## 2019-02-12 LAB — MAGNESIUM: Magnesium: 2.2 mg/dL (ref 1.7–2.4)

## 2019-02-12 LAB — PHOSPHORUS: Phosphorus: 4.7 mg/dL — ABNORMAL HIGH (ref 2.5–4.6)

## 2019-02-12 MED ORDER — ACETAMINOPHEN 325 MG PO TABS
650.0000 mg | ORAL_TABLET | Freq: Four times a day (QID) | ORAL | Status: DC | PRN
  Administered 2019-02-12: 650 mg via ORAL
  Filled 2019-02-12: qty 2

## 2019-02-12 MED ORDER — HYDROMORPHONE 1 MG/ML IV SOLN
INTRAVENOUS | Status: DC
  Administered 2019-02-12: 3.5 mg via INTRAVENOUS
  Administered 2019-02-12: 30 mg via INTRAVENOUS
  Administered 2019-02-13: 4 mg via INTRAVENOUS
  Administered 2019-02-13: 9.8 mg via INTRAVENOUS
  Administered 2019-02-13: 4 mg via INTRAVENOUS
  Administered 2019-02-13: 7 mg via INTRAVENOUS
  Administered 2019-02-13: 4.2 mg via INTRAVENOUS
  Administered 2019-02-13 (×2): 30 mg via INTRAVENOUS
  Administered 2019-02-14: 5.6 mg via INTRAVENOUS
  Administered 2019-02-14: 10.5 mg via INTRAVENOUS
  Administered 2019-02-14: 6.3 mg via INTRAVENOUS
  Administered 2019-02-14: 7 mg via INTRAVENOUS
  Administered 2019-02-14: 30 mg via INTRAVENOUS
  Filled 2019-02-12 (×4): qty 30

## 2019-02-12 MED ORDER — HYDROXYUREA 500 MG PO CAPS
2000.0000 mg | ORAL_CAPSULE | Freq: Every day | ORAL | Status: DC
  Administered 2019-02-12 – 2019-02-16 (×5): 2000 mg via ORAL
  Filled 2019-02-12 (×5): qty 4

## 2019-02-12 MED ORDER — DEXTROSE-NACL 5-0.45 % IV SOLN: INTRAVENOUS | Status: AC

## 2019-02-12 MED ORDER — OXYCODONE HCL 5 MG PO TABS
20.0000 mg | ORAL_TABLET | ORAL | Status: DC | PRN
  Administered 2019-02-12 – 2019-02-15 (×13): 20 mg via ORAL
  Filled 2019-02-12 (×13): qty 4

## 2019-02-12 MED ORDER — PNEUMOCOCCAL VAC POLYVALENT 25 MCG/0.5ML IJ INJ
0.5000 mL | INJECTION | INTRAMUSCULAR | Status: DC
  Filled 2019-02-12: qty 0.5

## 2019-02-12 NOTE — Progress Notes (Signed)
Patient ID: Stephanie Burch, female   DOB: 1980/04/25, 39 y.o.   MRN: [id number] Subjective: Stephanie Burch is a 39 y.o. female with medical history significant of sickle cell anemia, vitamin B12 deficiency, chronic pain syndrome, tachycardia chronic, and leukocytosis who was admitted yesterday for sickle cell pain crisis.  Patient still complaining of significant pain in her lower extremities, mostly around the knee joints bilaterally.  Fortunately there is no swelling or redness.  Patient did spiked temperature last night up to 102 F, only one-time reading.  Temperature has since returned to normal.  Patient rates her pain at 9/10, constant, throbbing, achy.  Patient denies any chest pain, cough, shortness of breath or dizziness.  Objective:  Vital signs in last 24 hours:  Vitals:   02/12/19 0658 02/12/19 0800 02/12/19 0927 02/12/19 1015  BP: (!) 139/98   (!) 149/104  Pulse: 99   (!) 112  Resp: 12 14 14 18   Temp: 98.1 F (36.7 C)   98.1 F (36.7 C)  TempSrc: Oral   Oral  SpO2: 99%  95% 98%  Weight:        Intake/Output from previous day:   Intake/Output Summary (Last 24 hours) at 02/12/2019 1300 Last data filed at 02/12/2019 0200 Gross per 24 hour  Intake 1056.67 ml  Output --  Net 1056.67 ml    Physical Exam: General: Alert, awake, oriented x3, in no acute distress.  Obese HEENT: [place]/AT PEERL, EOMI Neck: Trachea midline,  no masses, no thyromegal,y no JVD, no carotid bruit OROPHARYNX:  Moist, No exudate/ erythema/lesions.  Heart: Regular rate and rhythm, without murmurs, rubs, gallops, PMI non-displaced, no heaves or thrills on palpation.  Lungs: Clear to auscultation, no wheezing or rhonchi noted. No increased vocal fremitus resonant to percussion  Abdomen: Soft, nontender, nondistended, positive bowel sounds, no masses no hepatosplenomegaly noted..  Neuro: No focal neurological deficits noted cranial nerves II through XII grossly intact. DTRs 2+ bilaterally upper and lower  extremities. Strength 5 out of 5 in bilateral upper and lower extremities. Musculoskeletal: Bilateral knee joints moderately tender to range of motion. No warm swelling or erythema around joints, no spinal tenderness noted. Psychiatric: Patient alert and oriented x3, good insight and cognition, good recent to remote recall. Lymph node survey: No cervical axillary or inguinal lymphadenopathy noted.  Lab Results:  Basic Metabolic Panel:    Component Value Date/Time   NA 140 02/11/2019 1510   NA 142 09/11/2017 1431   K 4.8 02/11/2019 1510   CL 104 02/11/2019 1510   CO2 27 02/11/2019 1510   BUN 9 02/11/2019 1510   BUN 8 09/11/2017 1431   CREATININE 0.63 02/11/2019 1510   GLUCOSE 95 02/11/2019 1510   CALCIUM 9.6 02/11/2019 1510   CBC:    Component Value Date/Time   WBC 17.6 (H) 02/11/2019 1510   HGB 11.7 (L) 02/11/2019 1510   HGB 13.0 09/11/2017 1431   HCT 34.8 (L) 02/11/2019 1510   HCT 38.4 09/11/2017 1431   PLT 180 02/11/2019 1510   PLT 258 09/11/2017 1431   MCV 72.5 (L) 02/11/2019 1510   MCV 75 (L) 09/11/2017 1431   NEUTROABS 12.6 (H) 02/11/2019 1510   NEUTROABS 3.9 09/11/2017 1431   LYMPHSABS 2.8 02/11/2019 1510   LYMPHSABS 2.2 09/11/2017 1431   MONOABS 1.5 (H) 02/11/2019 1510   EOSABS 0.2 02/11/2019 1510   EOSABS 0.1 09/11/2017 1431   BASOSABS 0.1 02/11/2019 1510   BASOSABS 0.0 09/11/2017 1431    Recent Results (from the past 240  hour(s))  Respiratory Panel by RT PCR (Flu A&B, Covid) - Nasopharyngeal Swab     Status: None   Collection Time: 02/11/19  3:43 PM   Specimen: Nasopharyngeal Swab  Result Value Ref Range Status   SARS Coronavirus 2 by RT PCR NEGATIVE NEGATIVE Final    Comment: (NOTE) SARS-CoV-2 target nucleic acids are NOT DETECTED. The SARS-CoV-2 RNA is generally detectable in upper respiratoy specimens during the acute phase of infection. The lowest concentration of SARS-CoV-2 viral copies this assay can detect is 131 copies/mL. A negative result does  not preclude SARS-Cov-2 infection and should not be used as the sole basis for treatment or other patient management decisions. A negative result may occur with  improper specimen collection/handling, submission of specimen other than nasopharyngeal swab, presence of viral mutation(s) within the areas targeted by this assay, and inadequate number of viral copies (<131 copies/mL). A negative result must be combined with clinical observations, patient history, and epidemiological information. The expected result is Negative. Fact Sheet for Patients:  PinkCheek.be Fact Sheet for Healthcare Providers:  GravelBags.it This test is not yet ap proved or cleared by the [place] FDA and  has been authorized for detection and/or diagnosis of SARS-CoV-2 by FDA under an Emergency Use Authorization (EUA). This EUA will remain  in effect (meaning this test can be used) for the duration of the COVID-19 declaration under Section 564(b)(1) of the Act, 21 U.S.C. section 360bbb-3(b)(1), unless the authorization is terminated or revoked sooner.    Influenza A by PCR NEGATIVE NEGATIVE Final   Influenza B by PCR NEGATIVE NEGATIVE Final    Comment: (NOTE) The Xpert Xpress SARS-CoV-2/FLU/RSV assay is intended as an aid in  the diagnosis of influenza from Nasopharyngeal swab specimens and  should not be used as a sole basis for treatment. Nasal washings and  aspirates are unacceptable for Xpert Xpress SARS-CoV-2/FLU/RSV  testing. Fact Sheet for Patients: PinkCheek.be Fact Sheet for Healthcare Providers: GravelBags.it This test is not yet approved or cleared by the [place] FDA and  has been authorized for detection and/or diagnosis of SARS-CoV-2 by  FDA under an Emergency Use Authorization (EUA). This EUA will remain  in effect (meaning this test can be used) for the duration of the   Covid-19 declaration under Section 564(b)(1) of the Act, 21  U.S.C. section 360bbb-3(b)(1), unless the authorization is  terminated or revoked. Performed at [facility], [place] [address], [place], [place] [address]     Studies/Results: No results found.  Medications: Scheduled Meds: . DULoxetine  20 mg Oral Daily  . enoxaparin (LOVENOX) injection  40 mg Subcutaneous Q24H  . folic acid  1 mg Oral Daily  . HYDROmorphone   Intravenous Q4H  . hydroxyurea  2,000 mg Oral Daily  . ketorolac  30 mg Intravenous Q6H  . oxyCODONE  15 mg Oral Q12H  . [START ON 02/13/2019] pneumococcal 23 valent vaccine  0.5 mL Intramuscular Tomorrow-1000  . senna-docusate  1 tablet Oral BID  . tiZANidine  4 mg Oral TID  . Vitamin D (Ergocalciferol)  50,000 Units Oral Q7 days  . voxelotor  1,500 mg Oral Daily   Continuous Infusions: . dextrose 5 % and 0.45% NaCl     PRN Meds:.acetaminophen, bisacodyl, diphenhydrAMINE, hydrOXYzine, naloxone **AND** sodium chloride flush, ondansetron **OR** ondansetron (ZOFRAN) IV, Oxycodone HCl, polyethylene glycol  Assessment/Plan: Active Problems:   Sickle cell pain crisis (HCC)   Leukocytosis   Tachycardia with heart rate 100-120 beats per minute  Chronic pain syndrome  1. Hb Sickle Cell Disease with crisis: Continue IVF .45% Saline @ 125 mls/hour, adjust weight based Dilaudid PCA for better pain control, continue IV Toradol 30 mg Q 6 H, and home oral medications, monitor vitals very closely, Re-evaluate pain scale regularly, 2 L of Oxygen by [place]. 2. Leukocytosis: Most likely reactive but will monitor very closely in view of temperature spike.  If there is another temperature spike, will order cultures and start empiric antibiotics. 3. Sickle Cell Anemia: Hemoglobin is stable at baseline, no clinical indication for blood transfusion today. 4. Chronic pain Syndrome: Restart home pain medications.  Patient counseled extensively about pain and other  nonpharmacologic measures of pain management. 5. Tachycardia: We will continue rehydration and pain control, this is likely due to pain. DC cardiac monitor  Code Status: Full Code Family Communication: N/A Disposition Plan: Not yet ready for discharge  Jerilyn Gillaspie  If 7PM-7AM, please contact night-coverage.  02/12/2019, 1:00 PM  LOS: 1 day

## 2019-02-13 LAB — CBC WITH DIFFERENTIAL/PLATELET
Abs Immature Granulocytes: 0.1 10*3/uL — ABNORMAL HIGH (ref 0.00–0.07)
Basophils Absolute: 0 10*3/uL (ref 0.0–0.1)
Basophils Relative: 0 %
Eosinophils Absolute: 0.5 10*3/uL (ref 0.0–0.5)
Eosinophils Relative: 4 %
HCT: 27.8 % — ABNORMAL LOW (ref 36.0–46.0)
Hemoglobin: 9.2 g/dL — ABNORMAL LOW (ref 12.0–15.0)
Immature Granulocytes: 1 %
Lymphocytes Relative: 25 %
Lymphs Abs: 3.3 10*3/uL (ref 0.7–4.0)
MCH: 24.3 pg — ABNORMAL LOW (ref 26.0–34.0)
MCHC: 33.1 g/dL (ref 30.0–36.0)
MCV: 73.5 fL — ABNORMAL LOW (ref 80.0–100.0)
Monocytes Absolute: 1.4 10*3/uL — ABNORMAL HIGH (ref 0.1–1.0)
Monocytes Relative: 11 %
Neutro Abs: 7.9 10*3/uL — ABNORMAL HIGH (ref 1.7–7.7)
Neutrophils Relative %: 59 %
Platelets: 151 10*3/uL (ref 150–400)
RBC: 3.78 MIL/uL — ABNORMAL LOW (ref 3.87–5.11)
RDW: 14.5 % (ref 11.5–15.5)
WBC: 13.3 10*3/uL — ABNORMAL HIGH (ref 4.0–10.5)
nRBC: 1.9 % — ABNORMAL HIGH (ref 0.0–0.2)

## 2019-02-13 LAB — COMPREHENSIVE METABOLIC PANEL
ALT: 26 U/L (ref 0–44)
AST: 22 U/L (ref 15–41)
Albumin: 3.4 g/dL — ABNORMAL LOW (ref 3.5–5.0)
Alkaline Phosphatase: 84 U/L (ref 38–126)
Anion gap: 8 (ref 5–15)
BUN: 12 mg/dL (ref 6–20)
CO2: 25 mmol/L (ref 22–32)
Calcium: 8.6 mg/dL — ABNORMAL LOW (ref 8.9–10.3)
Chloride: 104 mmol/L (ref 98–111)
Creatinine, Ser: 0.64 mg/dL (ref 0.44–1.00)
GFR calc Af Amer: >60 mL/min (ref >60)
GFR calc non Af Amer: >60 mL/min (ref >60)
Glucose, Bld: 110 mg/dL — ABNORMAL HIGH (ref 70–99)
Potassium: 4.1 mmol/L (ref 3.5–5.1)
Sodium: 137 mmol/L (ref 135–145)
Total Bilirubin: 1.3 mg/dL — ABNORMAL HIGH (ref 0.3–1.2)
Total Protein: 6.5 g/dL (ref 6.5–8.1)

## 2019-02-13 NOTE — Progress Notes (Signed)
Patient ID: Stephanie Burch, female   DOB: 05-15-1980, 39 y.o.   MRN: [id number] Subjective: Stephanie Burch is a 39 y.o. female with medical history significant of sickle cell anemia, vitamin B12 deficiency, chronic pain syndrome, tachycardia chronic, and leukocytosis who was admitted yesterday for sickle cell pain crisis.  Patient claims she feels much better today although pain is still at about 8/10. She has not attempted to ambulate but feels like she is able to.  There is no redness or swelling of any joints.  Patient denies any fever, cough, chest pain, shortness of breath.  She denies any urinary symptoms.  Objective:  Vital signs in last 24 hours:  Vitals:   02/13/19 1219 02/13/19 1330 02/13/19 1451 02/13/19 1548  BP: (!) 142/91  120/79   Pulse: (!) 107  (!) 107   Resp: 17 15 15 14   Temp: 98 F (36.7 C)  98.9 F (37.2 C)   TempSrc: Oral  Oral   SpO2: 97% 99% 94% 98%  Weight:        Intake/Output from previous day:  No intake or output data in the 24 hours ending 02/13/19 1557  Physical Exam: General: Alert, awake, oriented x3, in no acute distress. Obese HEENT: [place]/AT [place]ERL, EOMI Neck: Trachea midline,  no masses, no thyromegal,y no JVD, no carotid bruit OROPHARYNX:  Moist, No exudate/ erythema/lesions.  Heart: Regular rate and rhythm, without murmurs, rubs, gallops, PMI non-displaced, no heaves or thrills on palpation.  Lungs: Clear to auscultation, no wheezing or rhonchi noted. No increased vocal fremitus resonant to percussion  Abdomen: Soft, nontender, nondistended, positive bowel sounds, no masses no hepatosplenomegaly noted..  Neuro: No focal neurological deficits noted cranial nerves II through XII grossly intact. DTRs 2+ bilaterally upper and lower extremities. Strength 5 out of 5 in bilateral upper and lower extremities. Musculoskeletal: Bilateral knee joints moderately tender to range of motion. No warm swelling or erythema around joints, no spinal tenderness  noted. Psychiatric: Patient alert and oriented x3, good insight and cognition, good recent to remote recall. Lymph node survey: No cervical axillary or inguinal lymphadenopathy noted.  Lab Results:  Basic Metabolic Panel:    Component Value Date/Time   NA 137 02/13/2019 0457   NA 142 09/11/2017 1431   K 4.1 02/13/2019 0457   CL 104 02/13/2019 0457   CO2 25 02/13/2019 0457   BUN 12 02/13/2019 0457   BUN 8 09/11/2017 1431   CREATININE 0.64 02/13/2019 0457   GLUCOSE 110 (H) 02/13/2019 0457   CALCIUM 8.6 (L) 02/13/2019 0457   CBC:    Component Value Date/Time   WBC 13.3 (H) 02/13/2019 0457   HGB 9.2 (L) 02/13/2019 0457   HGB 13.0 09/11/2017 1431   HCT 27.8 (L) 02/13/2019 0457   HCT 38.4 09/11/2017 1431   PLT 151 02/13/2019 0457   PLT 258 09/11/2017 1431   MCV 73.5 (L) 02/13/2019 0457   MCV 75 (L) 09/11/2017 1431   NEUTROABS 7.9 (H) 02/13/2019 0457   NEUTROABS 3.9 09/11/2017 1431   LYMPHSABS 3.3 02/13/2019 0457   LYMPHSABS 2.2 09/11/2017 1431   MONOABS 1.4 (H) 02/13/2019 0457   EOSABS 0.5 02/13/2019 0457   EOSABS 0.1 09/11/2017 1431   BASOSABS 0.0 02/13/2019 0457   BASOSABS 0.0 09/11/2017 1431    Recent Results (from the past 240 hour(s))  Respiratory Panel by RT PCR (Flu A&B, Covid) - Nasopharyngeal Swab     Status: None   Collection Time: 02/11/19  3:43 PM   Specimen: Nasopharyngeal Swab  Result Value Ref Range Status   SARS Coronavirus 2 by RT PCR NEGATIVE NEGATIVE Final    Comment: (NOTE) SARS-CoV-2 target nucleic acids are NOT DETECTED. The SARS-CoV-2 RNA is generally detectable in upper respiratoy specimens during the acute phase of infection. The lowest concentration of SARS-CoV-2 viral copies this assay can detect is 131 copies/mL. A negative result does not preclude SARS-Cov-2 infection and should not be used as the sole basis for treatment or other patient management decisions. A negative result may occur with  improper specimen collection/handling,  submission of specimen other than nasopharyngeal swab, presence of viral mutation(s) within the areas targeted by this assay, and inadequate number of viral copies (<131 copies/mL). A negative result must be combined with clinical observations, patient history, and epidemiological information. The expected result is Negative. Fact Sheet for Patients:  https://www.moore.com/ Fact Sheet for Healthcare Providers:  https://www.young.biz/ This test is not yet ap proved or cleared by the [place] FDA and  has been authorized for detection and/or diagnosis of SARS-CoV-2 by FDA under an Emergency Use Authorization (EUA). This EUA will remain  in effect (meaning this test can be used) for the duration of the COVID-19 declaration under Section 564(b)(1) of the Act, 21 U.S.C. section 360bbb-3(b)(1), unless the authorization is terminated or revoked sooner.    Influenza A by PCR NEGATIVE NEGATIVE Final   Influenza B by PCR NEGATIVE NEGATIVE Final    Comment: (NOTE) The Xpert Xpress SARS-CoV-2/FLU/RSV assay is intended as an aid in  the diagnosis of influenza from Nasopharyngeal swab specimens and  should not be used as a sole basis for treatment. Nasal washings and  aspirates are unacceptable for Xpert Xpress SARS-CoV-2/FLU/RSV  testing. Fact Sheet for Patients: https://www.moore.com/ Fact Sheet for Healthcare Providers: https://www.young.biz/ This test is not yet approved or cleared by the [place] FDA and  has been authorized for detection and/or diagnosis of SARS-CoV-2 by  FDA under an Emergency Use Authorization (EUA). This EUA will remain  in effect (meaning this test can be used) for the duration of the  Covid-19 declaration under Section 564(b)(1) of the Act, 21  U.S.C. section 360bbb-3(b)(1), unless the authorization is  terminated or revoked. Performed at [facility], [address] [place], [place] [address]     Studies/Results: No results found.  Medications: Scheduled Meds: . DULoxetine  20 mg Oral Daily  . enoxaparin (LOVENOX) injection  40 mg Subcutaneous Q24H  . folic acid  1 mg Oral Daily  . HYDROmorphone   Intravenous Q4H  . hydroxyurea  2,000 mg Oral Daily  . ketorolac  30 mg Intravenous Q6H  . oxyCODONE  15 mg Oral Q12H  . pneumococcal 23 valent vaccine  0.5 mL Intramuscular Tomorrow-1000  . senna-docusate  1 tablet Oral BID  . tiZANidine  4 mg Oral TID  . Vitamin D (Ergocalciferol)  50,000 Units Oral Q7 days  . voxelotor  1,500 mg Oral Daily   Continuous Infusions: . dextrose 5 % and 0.45% NaCl 125 mL/hr at 02/13/19 1352   PRN Meds:.acetaminophen, bisacodyl, diphenhydrAMINE, hydrOXYzine, naloxone **AND** sodium chloride flush, ondansetron **OR** ondansetron (ZOFRAN) IV, oxyCODONE, polyethylene glycol  Assessment/Plan: Active Problems:   Sickle cell pain crisis (HCC)   Leukocytosis   Tachycardia with heart rate 100-120 beats per minute   Chronic pain syndrome   Fever and chills  1. Hb Sickle Cell Disease with crisis: Reduce IVF .45% Saline to 75 mls/hour, continue weight based Dilaudid PCA at current setting for better pain  control, continue IV Toradol 30 mg Q 6 H, continue home oral medications, monitor vitals very closely, Re-evaluate pain scale regularly, 2 L of Oxygen by . 2. Leukocytosis: Dropped from 17- to 13 this morning, no fever. Will continue to monitor very closely. No clinical indication for antibiotics at this time. 3. Sickle Cell Anemia: Hemoglobin dropped a little but still close to baseline, may be due to hemodilution, no clinical indication for blood transfusion today. 4. Chronic pain Syndrome: Continue home pain medications. Patient counseled extensively about pain and other nonpharmacologic measures of pain management. 5. Tachycardia: Resolving. We will continue rehydration and pain control. DC cardiac monitor.  Ambulate  Code Status: Full Code Family Communication: N/A Disposition Plan: Not yet ready for discharge  Stephanie Burch  If 7PM-7AM, please contact night-coverage.  02/13/2019, 3:57 PM  LOS: 2 days

## 2019-02-13 NOTE — Progress Notes (Signed)
This RN has assumed care over the pt at this time. Agree w/ previous nurse's assessment. Pt assisted to bathroom w/ no complaints at this time.

## 2019-02-14 DIAGNOSIS — R509 Fever, unspecified: Secondary | ICD-10-CM

## 2019-02-14 LAB — CBC
HCT: 26.2 % — ABNORMAL LOW (ref 36.0–46.0)
Hemoglobin: 8.9 g/dL — ABNORMAL LOW (ref 12.0–15.0)
MCH: 24.9 pg — ABNORMAL LOW (ref 26.0–34.0)
MCHC: 34 g/dL (ref 30.0–36.0)
MCV: 73.2 fL — ABNORMAL LOW (ref 80.0–100.0)
Platelets: 155 10*3/uL (ref 150–400)
RBC: 3.58 MIL/uL — ABNORMAL LOW (ref 3.87–5.11)
RDW: 14.7 % (ref 11.5–15.5)
WBC: 12.7 10*3/uL — ABNORMAL HIGH (ref 4.0–10.5)
nRBC: 1.8 % — ABNORMAL HIGH (ref 0.0–0.2)

## 2019-02-14 MED ORDER — HYDROMORPHONE 1 MG/ML IV SOLN
INTRAVENOUS | Status: DC
  Administered 2019-02-14: 5 mg via INTRAVENOUS
  Administered 2019-02-14: 8.5 mg via INTRAVENOUS
  Administered 2019-02-14: 5.5 mg via INTRAVENOUS
  Administered 2019-02-15: 30 mg via INTRAVENOUS
  Administered 2019-02-15 (×2): 6 mg via INTRAVENOUS
  Filled 2019-02-14: qty 30

## 2019-02-14 NOTE — Plan of Care (Signed)

## 2019-02-14 NOTE — Progress Notes (Signed)
Subjective: Stephanie Burch, a 39 year old female with a medical history significant for sickle cell disease, chronic pain syndrome, chronic tachycardia, and history of anemia of chronic disease was admitted for sickle cell pain crisis.  Patient continues to complain left lower extremity pain.  Pain intensity is 7/10, which is improved from yesterday.  She states that pain is worsened with weightbearing.  She denies persistent cough, chest pain, shortness of breath, urinary symptoms, nausea, vomiting, or diarrhea.  Objective:  Vital signs in last 24 hours:  Vitals:   02/14/19 1213 02/14/19 1226 02/14/19 1308 02/14/19 1706  BP:   (!) 152/63   Pulse:   (!) 106   Resp: (!) 21 19 17 16   Temp:   99.2 F (37.3 C)   TempSrc:   Oral   SpO2: 96% 98% 99% 97%  Weight:        Intake/Output from previous day:   Intake/Output Summary (Last 24 hours) at 02/14/2019 1710 Last data filed at 02/14/2019 1400 Gross per 24 hour  Intake 6846.65 ml  Output --  Net 6846.65 ml    Physical Exam: General: Alert, awake, oriented x3, in no acute distress.  HEENT: [place]/AT PEERL, EOMI Neck: Trachea midline,  no masses, no thyromegal,y no JVD, no carotid bruit OROPHARYNX:  Moist, No exudate/ erythema/lesions.  Heart: Regular rate and rhythm, without murmurs, rubs, gallops, PMI non-displaced, no heaves or thrills on palpation.  Lungs: Clear to auscultation, no wheezing or rhonchi noted. No increased vocal fremitus resonant to percussion  Abdomen: Soft, nontender, nondistended, positive bowel sounds, no masses no hepatosplenomegaly noted..  Neuro: No focal neurological deficits noted cranial nerves II through XII grossly intact. DTRs 2+ bilaterally upper and lower extremities. Strength 5 out of 5 in bilateral upper and lower extremities. Musculoskeletal: No warm swelling or erythema around joints, no spinal tenderness noted. Psychiatric: Patient alert and oriented x3, good insight and cognition, good recent to  remote recall. Lymph node survey: No cervical axillary or inguinal lymphadenopathy noted.  Lab Results:  Basic Metabolic Panel:    Component Value Date/Time   NA 137 02/13/2019 0457   NA 142 09/11/2017 1431   K 4.1 02/13/2019 0457   CL 104 02/13/2019 0457   CO2 25 02/13/2019 0457   BUN 12 02/13/2019 0457   BUN 8 09/11/2017 1431   CREATININE 0.64 02/13/2019 0457   GLUCOSE 110 (H) 02/13/2019 0457   CALCIUM 8.6 (L) 02/13/2019 0457   CBC:    Component Value Date/Time   WBC 12.7 (H) 02/14/2019 0804   HGB 8.9 (L) 02/14/2019 0804   HGB 13.0 09/11/2017 1431   HCT 26.2 (L) 02/14/2019 0804   HCT 38.4 09/11/2017 1431   PLT 155 02/14/2019 0804   PLT 258 09/11/2017 1431   MCV 73.2 (L) 02/14/2019 0804   MCV 75 (L) 09/11/2017 1431   NEUTROABS 7.9 (H) 02/13/2019 0457   NEUTROABS 3.9 09/11/2017 1431   LYMPHSABS 3.3 02/13/2019 0457   LYMPHSABS 2.2 09/11/2017 1431   MONOABS 1.4 (H) 02/13/2019 0457   EOSABS 0.5 02/13/2019 0457   EOSABS 0.1 09/11/2017 1431   BASOSABS 0.0 02/13/2019 0457   BASOSABS 0.0 09/11/2017 1431    Recent Results (from the past 240 hour(s))  Respiratory Panel by RT PCR (Flu A&B, Covid) - Nasopharyngeal Swab     Status: None   Collection Time: 02/11/19  3:43 PM   Specimen: Nasopharyngeal Swab  Result Value Ref Range Status   SARS Coronavirus 2 by RT PCR NEGATIVE NEGATIVE Final  Comment: (NOTE) SARS-CoV-2 target nucleic acids are NOT DETECTED. The SARS-CoV-2 RNA is generally detectable in upper respiratoy specimens during the acute phase of infection. The lowest concentration of SARS-CoV-2 viral copies this assay can detect is 131 copies/mL. A negative result does not preclude SARS-Cov-2 infection and should not be used as the sole basis for treatment or other patient management decisions. A negative result may occur with  improper specimen collection/handling, submission of specimen other than nasopharyngeal swab, presence of viral mutation(s) within  the areas targeted by this assay, and inadequate number of viral copies (<131 copies/mL). A negative result must be combined with clinical observations, patient history, and epidemiological information. The expected result is Negative. Fact Sheet for Patients:  https://www.moore.com/ Fact Sheet for Healthcare Providers:  https://www.young.biz/ This test is not yet ap proved or cleared by the [place] FDA and  has been authorized for detection and/or diagnosis of SARS-CoV-2 by FDA under an Emergency Use Authorization (EUA). This EUA will remain  in effect (meaning this test can be used) for the duration of the COVID-19 declaration under Section 564(b)(1) of the Act, 21 U.S.C. section 360bbb-3(b)(1), unless the authorization is terminated or revoked sooner.    Influenza A by PCR NEGATIVE NEGATIVE Final   Influenza B by PCR NEGATIVE NEGATIVE Final    Comment: (NOTE) The Xpert Xpress SARS-CoV-2/FLU/RSV assay is intended as an aid in  the diagnosis of influenza from Nasopharyngeal swab specimens and  should not be used as a sole basis for treatment. Nasal washings and  aspirates are unacceptable for Xpert Xpress SARS-CoV-2/FLU/RSV  testing. Fact Sheet for Patients: https://www.moore.com/ Fact Sheet for Healthcare Providers: https://www.young.biz/ This test is not yet approved or cleared by the [place] FDA and  has been authorized for detection and/or diagnosis of SARS-CoV-2 by  FDA under an Emergency Use Authorization (EUA). This EUA will remain  in effect (meaning this test can be used) for the duration of the  Covid-19 declaration under Section 564(b)(1) of the Act, 21  U.S.C. section 360bbb-3(b)(1), unless the authorization is  terminated or revoked. Performed at [facility], [address], [place], [place] [address]     Studies/Results: No results  found.  Medications: Scheduled Meds: . DULoxetine  20 mg Oral Daily  . enoxaparin (LOVENOX) injection  40 mg Subcutaneous Q24H  . folic acid  1 mg Oral Daily  . HYDROmorphone   Intravenous Q4H  . hydroxyurea  2,000 mg Oral Daily  . ketorolac  30 mg Intravenous Q6H  . oxyCODONE  15 mg Oral Q12H  . pneumococcal 23 valent vaccine  0.5 mL Intramuscular Tomorrow-1000  . senna-docusate  1 tablet Oral BID  . tiZANidine  4 mg Oral TID  . Vitamin D (Ergocalciferol)  50,000 Units Oral Q7 days  . voxelotor  1,500 mg Oral Daily   Continuous Infusions: PRN Meds:.acetaminophen, bisacodyl, diphenhydrAMINE, hydrOXYzine, naloxone **AND** sodium chloride flush, ondansetron **OR** ondansetron (ZOFRAN) IV, oxyCODONE, polyethylene glycol  Consultants:  None  Procedures:  None  Antibiotics:  None  Assessment/Plan: Active Problems:   Sickle cell pain crisis (HCC)   Leukocytosis   Tachycardia with heart rate 100-120 beats per minute   Chronic pain syndrome   Fever and chills  Sickle cell disease with pain crisis: Discontinue IV fluids Weaning weightbase Dilaudid PCA.  Settings changed to 0.5 mg, 10-minute lockout, and 3 mg/h. Continue IV Toradol 30 mg every 6 hours.  Reevaluate pain scale regularly.  Monitor vital signs closely.  Leukocytosis: WBCs 12.7,  which is improved from previous.  Suspected to be reactive.  No signs of infection or inflammation.  Continue to monitor closely.  Sickle cell anemia: Hemoglobin stable, consistent with baseline.  No clinical indication for blood transfusion at this time.  Continue to monitor closely.  Chronic pain syndrome: Continue home medications.  Tachycardia: Improving.  Continue pain control.    Code Status: Full Code Family Communication: N/A Disposition Plan: Not yet ready for discharge.  Possible discharge in a.m.  Nolon Nations  APRN, MSN, FNP-C [facility] [address] [place], [place] [address] [phone]   If 5PM-7AM, please contact night-coverage.  02/14/2019, 5:10 PM  LOS: 3 days

## 2019-02-15 MED ORDER — OXYCODONE HCL 5 MG PO TABS
20.0000 mg | ORAL_TABLET | ORAL | Status: DC
  Administered 2019-02-15 – 2019-02-16 (×7): 20 mg via ORAL
  Filled 2019-02-15 (×7): qty 4

## 2019-02-15 MED ORDER — SODIUM CHLORIDE 0.9% FLUSH: 10.0000 mL | INTRAVENOUS | Status: DC | PRN

## 2019-02-15 MED ORDER — HYDROMORPHONE 1 MG/ML IV SOLN
INTRAVENOUS | Status: DC
  Administered 2019-02-15: 1 mg via INTRAVENOUS
  Administered 2019-02-15: 5 mg via INTRAVENOUS
  Administered 2019-02-15: 1.5 mg via INTRAVENOUS
  Administered 2019-02-15: 2.5 mg via INTRAVENOUS
  Administered 2019-02-16: 0.5 mg via INTRAVENOUS
  Administered 2019-02-16: 0 mg via INTRAVENOUS

## 2019-02-15 NOTE — Progress Notes (Signed)
Subjective: Stephanie Burch, a 39 year old female with a medical history significant for sickle cell disease, chronic pain syndrome, chronic tachycardia, and history of anemia of chronic disease was admitted for sickle cell pain crisis.  Patient states the pain is somewhat improved on today.  Pain is localized to left lower extremity.  Pain intensity is 5/10, which is improved from 1 day prior.  She says that she cannot manage at home at current pain intensity.  She states that pain is worsened with weightbearing.  Patient denies persistent cough, chest pain, shortness of breath, urinary symptoms, nausea, vomiting, or diarrhea.  Objective:  Vital signs in last 24 hours:  Vitals:   02/15/19 1104 02/15/19 1104 02/15/19 1300 02/15/19 1709  BP:  (!) 140/91    Pulse:  (!) 110    Resp: 17 18 16 16   Temp:      TempSrc:      SpO2: 93% 97% 94% 94%  Weight:        Intake/Output from previous day:   Intake/Output Summary (Last 24 hours) at 02/15/2019 1731 Last data filed at 02/15/2019 0600 Gross per 24 hour  Intake 360 ml  Output -  Net 360 ml    Physical Exam: General: Alert, awake, oriented x3, in no acute distress.  HEENT: [name]/AT PEERL, EOMI Neck: Trachea midline,  no masses, no thyromegal,y no JVD, no carotid bruit OROPHARYNX:  Moist, No exudate/ erythema/lesions.  Heart: Regular rate and rhythm, without murmurs, rubs, gallops, PMI non-displaced, no heaves or thrills on palpation.  Lungs: Clear to auscultation, no wheezing or rhonchi noted. No increased vocal fremitus resonant to percussion  Abdomen: Soft, nontender, nondistended, positive bowel sounds, no masses no hepatosplenomegaly noted..  Neuro: No focal neurological deficits noted cranial nerves II through XII grossly intact. DTRs 2+ bilaterally upper and lower extremities. Strength 5 out of 5 in bilateral upper and lower extremities. Musculoskeletal: No warm swelling or erythema around joints, no spinal tenderness  noted. Psychiatric: Patient alert and oriented x3, good insight and cognition, good recent to remote recall. Lymph node survey: No cervical axillary or inguinal lymphadenopathy noted.  Lab Results:  Basic Metabolic Panel:    Component Value Date/Time   NA 137 02/13/2019 0457   NA 142 09/11/2017 1431   K 4.1 02/13/2019 0457   CL 104 02/13/2019 0457   CO2 25 02/13/2019 0457   BUN 12 02/13/2019 0457   BUN 8 09/11/2017 1431   CREATININE 0.64 02/13/2019 0457   GLUCOSE 110 (H) 02/13/2019 0457   CALCIUM 8.6 (L) 02/13/2019 0457   CBC:    Component Value Date/Time   WBC 12.7 (H) 02/14/2019 0804   HGB 8.9 (L) 02/14/2019 0804   HGB 13.0 09/11/2017 1431   HCT 26.2 (L) 02/14/2019 0804   HCT 38.4 09/11/2017 1431   PLT 155 02/14/2019 0804   PLT 258 09/11/2017 1431   MCV 73.2 (L) 02/14/2019 0804   MCV 75 (L) 09/11/2017 1431   NEUTROABS 7.9 (H) 02/13/2019 0457   NEUTROABS 3.9 09/11/2017 1431   LYMPHSABS 3.3 02/13/2019 0457   LYMPHSABS 2.2 09/11/2017 1431   MONOABS 1.4 (H) 02/13/2019 0457   EOSABS 0.5 02/13/2019 0457   EOSABS 0.1 09/11/2017 1431   BASOSABS 0.0 02/13/2019 0457   BASOSABS 0.0 09/11/2017 1431    Recent Results (from the past 240 hour(s))  Respiratory Panel by RT PCR (Flu A&B, Covid) - Nasopharyngeal Swab     Status: None   Collection Time: 02/11/19  3:43 PM   Specimen: Nasopharyngeal  Swab  Result Value Ref Range Status   SARS Coronavirus 2 by RT PCR NEGATIVE NEGATIVE Final    Comment: (NOTE) SARS-CoV-2 target nucleic acids are NOT DETECTED. The SARS-CoV-2 RNA is generally detectable in upper respiratoy specimens during the acute phase of infection. The lowest concentration of SARS-CoV-2 viral copies this assay can detect is 131 copies/mL. A negative result does not preclude SARS-Cov-2 infection and should not be used as the sole basis for treatment or other patient management decisions. A negative result may occur with  improper specimen collection/handling,  submission of specimen other than nasopharyngeal swab, presence of viral mutation(s) within the areas targeted by this assay, and inadequate number of viral copies (<131 copies/mL). A negative result must be combined with clinical observations, patient history, and epidemiological information. The expected result is Negative. Fact Sheet for Patients:  PinkCheek.be Fact Sheet for Healthcare Providers:  GravelBags.it This test is not yet ap proved or cleared by the [place] FDA and  has been authorized for detection and/or diagnosis of SARS-CoV-2 by FDA under an Emergency Use Authorization (EUA). This EUA will remain  in effect (meaning this test can be used) for the duration of the COVID-19 declaration under Section 564(b)(1) of the Act, 21 U.S.C. section 360bbb-3(b)(1), unless the authorization is terminated or revoked sooner.    Influenza A by PCR NEGATIVE NEGATIVE Final   Influenza B by PCR NEGATIVE NEGATIVE Final    Comment: (NOTE) The Xpert Xpress SARS-CoV-2/FLU/RSV assay is intended as an aid in  the diagnosis of influenza from Nasopharyngeal swab specimens and  should not be used as a sole basis for treatment. Nasal washings and  aspirates are unacceptable for Xpert Xpress SARS-CoV-2/FLU/RSV  testing. Fact Sheet for Patients: PinkCheek.be Fact Sheet for Healthcare Providers: GravelBags.it This test is not yet approved or cleared by the [place] FDA and  has been authorized for detection and/or diagnosis of SARS-CoV-2 by  FDA under an Emergency Use Authorization (EUA). This EUA will remain  in effect (meaning this test can be used) for the duration of the  Covid-19 declaration under Section 564(b)(1) of the Act, 21  U.S.C. section 360bbb-3(b)(1), unless the authorization is  terminated or revoked. Performed at [facility], [place]  [address] [place], [place] [address]     Studies/Results: No results found.  Medications: Scheduled Meds: . DULoxetine  20 mg Oral Daily  . enoxaparin (LOVENOX) injection  40 mg Subcutaneous Q24H  . folic acid  1 mg Oral Daily  . HYDROmorphone   Intravenous Q4H  . hydroxyurea  2,000 mg Oral Daily  . ketorolac  30 mg Intravenous Q6H  . oxyCODONE  20 mg Oral Q4H while awake  . oxyCODONE  15 mg Oral Q12H  . pneumococcal 23 valent vaccine  0.5 mL Intramuscular Tomorrow-1000  . senna-docusate  1 tablet Oral BID  . tiZANidine  4 mg Oral TID  . Vitamin D (Ergocalciferol)  50,000 Units Oral Q7 days  . voxelotor  1,500 mg Oral Daily   Continuous Infusions: PRN Meds:.acetaminophen, bisacodyl, diphenhydrAMINE, hydrOXYzine, naloxone **AND** sodium chloride flush, ondansetron **OR** ondansetron (ZOFRAN) IV, polyethylene glycol, sodium chloride flush  Consultants:  None  Procedures:  None  Antibiotics:  None  Assessment/Plan: Active Problems:   Sickle cell pain crisis (HCC)   Leukocytosis   Tachycardia with heart rate 100-120 beats per minute   Chronic pain syndrome   Fever and chills  Sickle cell disease with pain crisis: Continue weaning weight-based Dilaudid PCA.  Settings  changed to 0.5 mg, 10-minute lockout, and 2 mg/h. Continue IV Toradol 30 mg every 6 hours Oxycodone 15 mg every 4 hours while awake Reevaluate pain scale regularly Monitor vital signs closely.  Leukocytosis: WBCs improved.  Suspected to be reactive.  No signs of infection or inflammation.  Continue to monitor closely.  Sickle cell anemia: Hemoglobin stable.  Patient's hemoglobin is consistent with baseline.  There is no clinical indication for blood transfusion at this time.  Continue to monitor closely.  Chronic pain syndrome: Continue home medications  Tachycardia: Improving.  Continue pain control.  Code Status: Full Code Family Communication: N/A Disposition Plan: Not yet ready for  discharge  Ahmaya Ostermiller Rennis Petty  APRN, MSN, FNP-C [facility] [address] [place], [place] [address] [phone]   If 5PM-7AM, please contact night-coverage.  02/15/2019, 5:31 PM  LOS: 4 days

## 2019-02-15 NOTE — BH Specialist Note (Signed)
Integrated Behavioral Health Note  Session Start time: 12:15   End Time: 1:15 Total Time: 60 min Type of Service: [place] Interpreter: No.   Interpreter Name & Language: none  Session #:  SUBJECTIVE: Stephanie Burch is a 39 y.o. female brought in by patient.  Pt./Family was referred by NP for:  anxiety, depression and stress precipitating sickle cell crisis. 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 Reduce symptoms of: anxiety, depression and stress  2.  Increase knowledge and/or ability of: coping skills and self-management skills  3.  Demonstrate ability to: Increase healthy adjustment to current life circumstances  INTERVENTIONS: Interventions utilized:  Supportive Counseling and Psychoeducation and/or Health Education Standardized Assessments completed: Not Needed  Patient admitted in [place]; met with patient at bedside. Patient continues to find managing pain at home challenging due to feeling preoccupied by her  pain medication and worries she will run out and not get a refill in time. Patient reported wanting to reduce her use; CSW provided education on titrating medication and the importance of consulting with PCP on this. Reflection on the amount of energy and effort patient spends thinking about her pain and pain medication. Encouraged use of pain journal and planned to review at next session.  Patient also discussed updates with her ex-partner. CSW provided supportive counseling and reflective listening. Intimate partnership conflict has been a source of stress for patient over the past few months. Patient processing challenges within the relationship and exhibited increased insight into relationship patterns.  Patient may benefit from continued supportive counseling with CBT and ACT strategies for processing pain experiences and intimate relationship difficulties.    PLAN: 1. F/U with behavioral health clinician: at next PCP visit, will schedule concurrently 2. Behavioral recommendations: pain journal 3. Referral: none 4. From scale of 1-10, how likely are you to follow plan:   Estanislado Emms, [facility] [phone]

## 2019-02-15 NOTE — Progress Notes (Signed)
Patient complained of burning at IV site. Attempted flush pt reports burning and site is hard to flush. Pt request IV RN. Pt appears to have poor venous access.

## 2019-02-15 NOTE — Plan of Care (Signed)

## 2019-02-16 ENCOUNTER — Other Ambulatory Visit: Payer: Self-pay | Admitting: Family Medicine

## 2019-02-16 DIAGNOSIS — D571 Sickle-cell disease without crisis: Secondary | ICD-10-CM

## 2019-02-16 DIAGNOSIS — G894 Chronic pain syndrome: Secondary | ICD-10-CM

## 2019-02-16 DIAGNOSIS — F1190 Opioid use, unspecified, uncomplicated: Secondary | ICD-10-CM

## 2019-02-16 MED ORDER — OXYCODONE HCL 20 MG PO TABS: 20.0000 mg | ORAL_TABLET | ORAL | 0 refills | Status: DC | PRN

## 2019-02-16 NOTE — Progress Notes (Signed)
Pt sitting on chair, waiting for ride, offers no complaints.

## 2019-02-16 NOTE — Progress Notes (Signed)
Discharge instructions have been reviewed. Questions, concerns have been denied at this time.

## 2019-02-16 NOTE — Discharge Instructions (Signed)
 Chronic Pain, Adult Chronic pain is a type of pain that lasts or keeps coming back (recurs) for at least six months. You may have chronic headaches, abdominal pain, or body pain. Chronic pain may be related to an illness, such as fibromyalgia or complex regional pain syndrome. Sometimes the cause of chronic pain is not known. Chronic pain can make it hard for you to do daily activities. If not treated, chronic pain can lead to other health problems, including anxiety and depression. Treatment depends on the cause and severity of your pain. You may need to work with a pain specialist to come up with a treatment plan. The plan may include medicine, counseling, and physical therapy. Many people benefit from a combination of two or more types of treatment to control their pain. Follow these instructions at home: Lifestyle  Consider keeping a pain diary to share with your health care providers.  Consider talking with a mental health care provider (psychologist) about how to cope with chronic pain.  Consider joining a chronic pain support group.  Try to control or lower your stress levels. Talk to your health care provider about strategies to do this. General instructions   Take over-the-counter and prescription medicines only as told by your health care provider.  Follow your treatment plan as told by your health care provider. This may include: ? Gentle, regular exercise. ? Eating a healthy diet that includes foods such as vegetables, fruits, fish, and lean meats. ? Cognitive or behavioral therapy. ? Working with a physical therapist. ? Meditation or yoga. ? Acupuncture or massage therapy. ? Aroma, color, light, or sound therapy. ? Local electrical stimulation. ? Shots (injections) of numbing or pain-relieving medicines into the spine or the area of pain.  Check your pain level as told by your health care provider. Ask your health care provider if you should use a pain scale.  Learn as  much as you can about how to manage your chronic pain. Ask your health care provider if an intensive pain rehabilitation program or a chronic pain specialist would be helpful.  Keep all follow-up visits as told by your health care provider. This is important. Contact a health care provider if:  Your pain gets worse.  You have new pain.  You have trouble sleeping.  You have trouble doing your normal activities.  Your pain is not controlled with treatment.  Your have side effects from pain medicine.  You feel weak. Get help right away if:  You lose feeling or have numbness in your body.  You lose control of bowel or bladder function.  Your pain suddenly gets much worse.  You develop shaking or chills.  You develop confusion.  You develop chest pain.  You have trouble breathing or shortness of breath.  You pass out.  You have thoughts about hurting yourself or others. This information is not intended to replace advice given to you by your health care provider. Make sure you discuss any questions you have with your health care provider. Document Revised: 01/02/2017 Document Reviewed: 07/10/2015 Elsevier Patient Education  2020 Elsevier Inc. Sickle Cell Anemia, Adult  Sickle cell anemia is a condition where your red blood cells are shaped like sickles. Red blood cells carry oxygen through the body. Sickle-shaped cells do not live as long as normal red blood cells. They also clump together and block blood from flowing through the blood vessels. This prevents the body from getting enough oxygen. Sickle cell anemia causes organ damage and   pain. It also increases the risk of infection. Follow these instructions at home: Medicines  Take over-the-counter and prescription medicines only as told by your doctor.  If you were prescribed an antibiotic medicine, take it as told by your doctor. Do not stop taking the antibiotic even if you start to feel better.  If you develop a  fever, do not take medicines to lower the fever right away. Tell your doctor about the fever. Managing pain, stiffness, and swelling  Try these methods to help with pain: ? Use a heating pad. ? Take a warm bath. ? Distract yourself, such as by watching TV. Eating and drinking  Drink enough fluid to keep your pee (urine) clear or pale yellow. Drink more in hot weather and during exercise.  Limit or avoid alcohol.  Eat a healthy diet. Eat plenty of fruits, vegetables, whole grains, and lean protein.  Take vitamins and supplements as told by your doctor. Traveling  When traveling, keep these with you: ? Your medical information. ? The names of your doctors. ? Your medicines.  If you need to take an airplane, talk to your doctor first. Activity  Rest often.  Avoid exercises that make your heart beat much faster, such as jogging. General instructions  Do not use products that have nicotine or tobacco, such as cigarettes and e-cigarettes. If you need help quitting, ask your doctor.  Consider wearing a medical alert bracelet.  Avoid being in high places (high altitudes), such as mountains.  Avoid very hot or cold temperatures.  Avoid places where the temperature changes a lot.  Keep all follow-up visits as told by your doctor. This is important. Contact a doctor if:  A joint hurts.  Your feet or hands hurt or swell.  You feel tired (fatigued). Get help right away if:  You have symptoms of infection. These include: ? Fever. ? Chills. ? Being very tired. ? Irritability. ? Poor eating. ? Throwing up (vomiting).  You feel dizzy or faint.  You have new stomach pain, especially on the left side.  You have a an erection (priapism) that lasts more than 4 hours.  You have numbness in your arms or legs.  You have a hard time moving your arms or legs.  You have trouble talking.  You have pain that does not go away when you take medicine.  You are short of  breath.  You are breathing fast.  You have a long-term cough.  You have pain in your chest.  You have a bad headache.  You have a stiff neck.  Your stomach looks bloated even though you did not eat much.  Your skin is pale.  You suddenly cannot see well. Summary  Sickle cell anemia is a condition where your red blood cells are shaped like sickles.  Follow your doctor's advice on ways to manage pain, food to eat, activities to do, and steps to take for safe travel.  Get medical help right away if you have any signs of infection, such as a fever. This information is not intended to replace advice given to you by your health care provider. Make sure you discuss any questions you have with your health care provider. Document Revised: 05/14/2018 Document Reviewed: 02/26/2016 Elsevier Patient Education  2020 Elsevier Inc.  

## 2019-02-16 NOTE — Progress Notes (Signed)
Received order to discontinue midline. Informed nurse it can be removed as a PIV.

## 2019-02-16 NOTE — Discharge Summary (Signed)
Physician Discharge Summary  [name]:[id number] DOB: [birth year] DOA: 02/11/2019  PCP: Kallie Locks, FNP  Admit date: 02/11/2019  Discharge date: 02/16/2019  Discharge Diagnoses:  Active Problems:   Sickle cell pain crisis (HCC)   Leukocytosis   Tachycardia with heart rate 100-120 beats per minute   Chronic pain syndrome   Fever and chills   Discharge Condition: Stable  Disposition:  Pt is discharged home in good condition and is to follow up with Kallie Locks, FNP in 2 weeks  to have labs evaluated. Stephanie Burch is instructed to increase activity slowly and balance with rest for the next few days, and use prescribed medication to complete treatment of pain  Diet: Regular Wt Readings from Last 3 Encounters:  02/15/19 122 kg  01/18/19 119.7 kg  12/22/18 117.9 kg    History of present illness:  Stephanie Burch, a 39 year old female with a medical history significant for sickle cell disease, chronic pain syndrome, opiate dependence and tolerance, morbid obesity, and history of chronic tachycardia presented to ER complaining of lower extremity pain.  Lower extremity pain is consistent with patient's typical crisis.  Patient had no associated fevers, chills, chest pain, or shortness of breath.  Patient was last evaluated in the ER on 01/18/2019 and was treated as an outpatient.  Patient has been taking home medications without sustained relief.  Patient's oxycodone was last refilled on 02/02/2019 and she was given 90 tablets 20 mg each to take every 4 hours as needed.  Patient has not had sustained relief.  Also, patient admits that she has been taking extra medication and worried if she was about to run out.  She says that her normal medications have not been helpful.  ER course: WBC 17 K, which is increased from prior.  Hemoglobin is 11.7, which is consistent with patient's baseline.  Total bilirubin 1.7.  Treated with IV Dilaudid, IV fluids, and IV Toradol, symptoms  persists patient admitted to MedSurg for management of pain crisis  Hospital Course:  Sickle cell pain crisis: Patient was admitted for sickle cell pain crisis and managed appropriately with IVF, IV Dilaudid via PCA and IV Toradol, as well as other adjunct therapies per sickle cell pain management protocols.  Dilaudid PCA weaned appropriately. History of chronic pain syndrome, oxycodone ER continued without interruption.  Patient transition to home medications. Advised to notify PCP for opiate medication request.  Patient is also to schedule an appointment for medication management.  Pain intensity decreased to 5/10.  Consistent with patient's goal.  She states that she can manage at home on current medication regimen.  Patient is alert, oriented, and ambulating without assistance.  She is afebrile and oxygen saturation 100% on RA.  Patient was discharged home today in a hemodynamically stable condition.   Discharge Exam: Vitals:   02/16/19 0900 02/16/19 0956  BP:  112/72  Pulse:  83  Resp: 18 16  Temp:  98.4 F (36.9 C)  SpO2: 100% 100%   Vitals:   02/16/19 0405 02/16/19 0433 02/16/19 0900 02/16/19 0956  BP:  130/73  112/72  Pulse:  87  83  Resp: 20 18 18 16   Temp:  98 F (36.7 C)  98.4 F (36.9 C)  TempSrc:  Oral  Oral  SpO2: 100% 100% 100% 100%  Weight:      Height:        General appearance : Awake, alert, not in any distress. Speech Clear. Not toxic looking HEENT: Atraumatic and  Normocephalic, pupils equally reactive to light and accomodation Neck: Supple, no JVD. No cervical lymphadenopathy.  Chest: Good air entry bilaterally, no added sounds  CVS: S1 S2 regular, no murmurs.  Abdomen: Bowel sounds present, Non tender and not distended with no gaurding, rigidity or rebound. Extremities: B/L Lower Ext shows no edema, both legs are warm to touch Neurology: Awake alert, and oriented X 3, CN II-XII intact, Non focal Skin: No Rash  Discharge Instructions  Discharge  Instructions    Discharge patient   Complete by: As directed    Discharge disposition: 01-Home or Self Care   Discharge patient date: 02/16/2019     Allergies as of 02/16/2019   No Known Allergies     Medication List    TAKE these medications   docusate sodium 100 MG capsule Commonly known as: Colace Take 1 capsule (100 mg total) by mouth 2 (two) times daily.   DULoxetine 20 MG capsule Commonly known as: CYMBALTA TAKE 1 CAPSULE BY MOUTH EVERY DAY   folic acid 1 MG tablet Commonly known as: FOLVITE Take 1 tablet (1 mg total) by mouth daily.   hydroxyurea 500 MG capsule Commonly known as: Hydrea Take 4 capsules (2,000 mg total) by mouth daily. May take with food to minimize GI side effects.   ibuprofen 800 MG tablet Commonly known as: ADVIL Take 1 tablet (800 mg total) by mouth every 8 (eight) hours as needed for headache, mild pain, moderate pain or cramping.   naloxone 0.4 MG/ML injection Commonly known as: NARCAN As needed   ondansetron 4 MG tablet Commonly known as: ZOFRAN TAKE 1 TABLET BY MOUTH EVERY 8 HOURS AS NEEDED FOR NAUSEA AND VOMITING   Oxbryta 500 MG Tabs tablet Generic drug: voxelotor Take 1,500 mg by mouth daily. For Sickle Cell Anemia   Oxycodone HCl 20 MG Tabs Take 1 tablet (20 mg total) by mouth every 4 (four) hours as needed (for pain).   prochlorperazine 10 MG tablet Commonly known as: COMPAZINE Take 1 tablet (10 mg total) by mouth every 6 (six) hours as needed for nausea or vomiting. Take if Ondansetron is not effective.   tiZANidine 4 MG capsule Commonly known as: ZANAFLEX TAKE 1 CAPSULE (4 MG TOTAL) BY MOUTH 3 (THREE) TIMES DAILY.   vitamin B-12 50 MCG tablet Commonly known as: CYANOCOBALAMIN Take 1 tablet (50 mcg total) by mouth daily.   Vitamin D (Ergocalciferol) 1.25 MG (50000 UNIT) Caps capsule Commonly known as: DRISDOL Take 1 capsule (50,000 Units total) by mouth every 7 (seven) days.   Xtampza ER 13.5 MG C12a Generic drug:  oxyCODONE ER Take 13.5 mg by mouth every 12 (twelve) hours.       The results of significant diagnostics from this hospitalization (including imaging, microbiology, ancillary and laboratory) are listed below for reference.    Significant Diagnostic Studies: No results found.  Microbiology: Recent Results (from the past 240 hour(s))  Respiratory Panel by RT PCR (Flu A&B, Covid) - Nasopharyngeal Swab     Status: None   Collection Time: 02/11/19  3:43 PM   Specimen: Nasopharyngeal Swab  Result Value Ref Range Status   SARS Coronavirus 2 by RT PCR NEGATIVE NEGATIVE Final    Comment: (NOTE) SARS-CoV-2 target nucleic acids are NOT DETECTED. The SARS-CoV-2 RNA is generally detectable in upper respiratoy specimens during the acute phase of infection. The lowest concentration of SARS-CoV-2 viral copies this assay can detect is 131 copies/mL. A negative result does not preclude SARS-Cov-2 infection and should not  be used as the sole basis for treatment or other patient management decisions. A negative result may occur with  improper specimen collection/handling, submission of specimen other than nasopharyngeal swab, presence of viral mutation(s) within the areas targeted by this assay, and inadequate number of viral copies (<131 copies/mL). A negative result must be combined with clinical observations, patient history, and epidemiological information. The expected result is Negative. Fact Sheet for Patients:  PinkCheek.be Fact Sheet for Healthcare Providers:  GravelBags.it This test is not yet ap proved or cleared by the [place] FDA and  has been authorized for detection and/or diagnosis of SARS-CoV-2 by FDA under an Emergency Use Authorization (EUA). This EUA will remain  in effect (meaning this test can be used) for the duration of the COVID-19 declaration under Section 564(b)(1) of the Act, 21 U.S.C. section  360bbb-3(b)(1), unless the authorization is terminated or revoked sooner.    Influenza A by PCR NEGATIVE NEGATIVE Final   Influenza B by PCR NEGATIVE NEGATIVE Final    Comment: (NOTE) The Xpert Xpress SARS-CoV-2/FLU/RSV assay is intended as an aid in  the diagnosis of influenza from Nasopharyngeal swab specimens and  should not be used as a sole basis for treatment. Nasal washings and  aspirates are unacceptable for Xpert Xpress SARS-CoV-2/FLU/RSV  testing. Fact Sheet for Patients: PinkCheek.be Fact Sheet for Healthcare Providers: GravelBags.it This test is not yet approved or cleared by the [place] FDA and  has been authorized for detection and/or diagnosis of SARS-CoV-2 by  FDA under an Emergency Use Authorization (EUA). This EUA will remain  in effect (meaning this test can be used) for the duration of the  Covid-19 declaration under Section 564(b)(1) of the Act, 21  U.S.C. section 360bbb-3(b)(1), unless the authorization is  terminated or revoked. Performed at [facility], [place] [address] [place], [place] [address]      Labs: Basic Metabolic Panel: Recent Labs  Lab 02/11/19 1510 02/12/19 0523 02/13/19 0457  NA 140  --  137  K 4.8  --  4.1  CL 104  --  104  CO2 27  --  25  GLUCOSE 95  --  110*  BUN 9  --  12  CREATININE 0.63  --  0.64  CALCIUM 9.6  --  8.6*  MG  --  2.2  --   PHOS  --  4.7*  --    Liver Function Tests: Recent Labs  Lab 02/11/19 1510 02/13/19 0457  AST 25 22  ALT 25 26  ALKPHOS 86 84  BILITOT 1.7* 1.3*  PROT 8.1 6.5  ALBUMIN 4.5 3.4*   No results for input(s): LIPASE, AMYLASE in the last 168 hours. No results for input(s): AMMONIA in the last 168 hours. CBC: Recent Labs  Lab 02/11/19 1510 02/13/19 0457 02/14/19 0804  WBC 17.6* 13.3* 12.7*  NEUTROABS 12.6* 7.9*  --   HGB 11.7* 9.2* 8.9*  HCT 34.8* 27.8* 26.2*  MCV 72.5* 73.5* 73.2*  PLT 180 151 155    Cardiac Enzymes: No results for input(s): CKTOTAL, CKMB, CKMBINDEX, TROPONINI in the last 168 hours. BNP: Invalid input(s): POCBNP CBG: No results for input(s): GLUCAP in the last 168 hours.  Time coordinating discharge: 50 minutes  Signed:  [name]  APRN, MSN, FNP-C [facility] [address] [place], [place] [address] [phone]  [facility] 02/16/2019, 1:06 PM

## 2019-02-18 ENCOUNTER — Other Ambulatory Visit: Payer: Self-pay | Admitting: Family Medicine

## 2019-02-18 DIAGNOSIS — Z79899 Other long term (current) drug therapy: Secondary | ICD-10-CM

## 2019-02-18 DIAGNOSIS — G894 Chronic pain syndrome: Secondary | ICD-10-CM

## 2019-02-18 DIAGNOSIS — D5700 Hb-SS disease with crisis, unspecified: Secondary | ICD-10-CM

## 2019-02-18 DIAGNOSIS — R110 Nausea: Secondary | ICD-10-CM

## 2019-02-28 ENCOUNTER — Ambulatory Visit (INDEPENDENT_AMBULATORY_CARE_PROVIDER_SITE_OTHER): Payer: Medicaid Other | Admitting: Family Medicine

## 2019-02-28 ENCOUNTER — Encounter: Payer: Self-pay | Admitting: Family Medicine

## 2019-02-28 ENCOUNTER — Ambulatory Visit (INDEPENDENT_AMBULATORY_CARE_PROVIDER_SITE_OTHER): Payer: Medicaid Other | Admitting: Clinical

## 2019-02-28 ENCOUNTER — Other Ambulatory Visit: Payer: Self-pay

## 2019-02-28 VITALS — BP 133/77 | HR 101 | Temp 98.3°F | Ht 65.0 in | Wt 277.0 lb

## 2019-02-28 DIAGNOSIS — Z79899 Other long term (current) drug therapy: Secondary | ICD-10-CM

## 2019-02-28 DIAGNOSIS — F419 Anxiety disorder, unspecified: Secondary | ICD-10-CM | POA: Diagnosis not present

## 2019-02-28 DIAGNOSIS — G894 Chronic pain syndrome: Secondary | ICD-10-CM

## 2019-02-28 DIAGNOSIS — Z09 Encounter for follow-up examination after completed treatment for conditions other than malignant neoplasm: Secondary | ICD-10-CM

## 2019-02-28 DIAGNOSIS — F1190 Opioid use, unspecified, uncomplicated: Secondary | ICD-10-CM

## 2019-02-28 DIAGNOSIS — D571 Sickle-cell disease without crisis: Secondary | ICD-10-CM | POA: Diagnosis not present

## 2019-02-28 LAB — POCT URINALYSIS DIPSTICK
Bilirubin, UA: NEGATIVE
Blood, UA: NEGATIVE
Glucose, UA: NEGATIVE
Ketones, UA: NEGATIVE
Leukocytes, UA: NEGATIVE
Nitrite, UA: NEGATIVE
Protein, UA: NEGATIVE
Spec Grav, UA: 1.02 (ref 1.010–1.025)
Urobilinogen, UA: 0.2 E.U./dL
pH, UA: 6 (ref 5.0–8.0)

## 2019-02-28 MED ORDER — OXYCODONE HCL 20 MG PO TABS: 20.0000 mg | ORAL_TABLET | ORAL | 0 refills | Status: DC | PRN

## 2019-02-28 NOTE — BH Specialist Note (Signed)
Integrated Behavioral Health Follow Up Visit  MRN: [id number] Name: Stephanie Burch  Number of Integrated Behavioral Health Clinician visits: 8/10 Session Start time: 11:40  Session End time: 12:25 Total time: 45   Type of Service: Integrated Behavioral Health- Individual/Family Interpretor:No. Interpretor Name and Language: none  SUBJECTIVE: Stephanie Burch is a 39 y.o. female accompanied by self Patient was referred by NP for anxiety, depression, stress precipitating sickle cell crises. 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  Reduce symptoms of: anxiety, depression and stress  2.  Increase knowledge and/or ability of: coping skills and self-management skills  3.  Demonstrate ability to: Increase healthy adjustment to current life circumstances  INTERVENTIONS: Interventions utilized:  Supportive Counseling Standardized Assessments completed: Not Needed  ASSESSMENT: Patient currently experiencing depression related to chronic pain and frequent pain crises. Patient also grappling with perception of self in context of relationships; relationship with intimate partner has been a source of stress over the past few months. CSW provided supportive counseling and led processing of patient's core beliefs about self in  relationships; reflected on themes of control and raised awareness of patient's black and Cal[place] thinking. Also processed how this relates to patient's acceptance of her illness and pain experience. Patient exhibits increased awareness of behaviors.   Patient may benefit from continued supportive counseling including CBT and ACT approaches to challenging automatic thoughts and negative core beliefs about pain and about self in relationships.  PLAN: 1. Follow up with behavioral health clinician on : end of February, will call to schedule follow up appt 2. Behavioral recommendations:  3. Referral(s): Integrated Behavioral Health Services (In Clinic) 4. "From scale of 1-10, how likely are you to follow plan?":   Abigail Butts, LCSW  [facility] [phone]

## 2019-02-28 NOTE — Progress Notes (Signed)
[facility]    Established Patient Office Visit  Subjective:  Patient ID: Stephanie Burch, female    DOB: 12/30/1980  Age: 39 y.o. MRN: [id number]  CC:  Chief Complaint  Patient presents with  . Follow-up    Sickle Cell    HPI Stephanie Burch is a 39 year old female who presents for Follow Up today.   Past Medical History:  Diagnosis Date  . Nausea   . Sickle cell anemia (HCC)   . Vitamin B12 deficiency 12/2018  . Vitamin D deficiency    Current Status: Since her last office visit, she is doing well with no complaints. She states that she has pain in legs. She rates her pain today at /10. She has not had a hospital visit for Sickle Cell Crisis since 02/11/2019 where she was treated and discharged on 02/16/2019. She states that after a week after she was released from hospital, she experienced increased pain, but eventually managed pain at home without returning back to hospital. She is currently taking all medications as prescribed and staying well hydrated. She reports occasional nausea, constipation, dizziness and headaches. Her anxiety is mild today. She has not been taking anti-anxiety medication as prescribed, because she has been dealing with her anxiety/depression well lately. She denies suicidal ideations, homicidal ideations, or auditory hallucinations. She is taking Oxybryta daily as prescribed since last hospital visit. She denies fevers, chills, fatigue, recent infections, weight loss, and night sweats. She has not had any headaches, visual changes, dizziness, and falls. No chest pain, heart palpitations, cough and shortness of breath reported. No reports of GI problems such as nausea, vomiting, diarrhea, and constipation. She has no reports of blood in stools, dysuria and hematuria.   History reviewed. No pertinent surgical history.  Family History  Problem Relation Age of Onset  . Hypertension Mother   . Sickle cell trait  Mother   . Diabetes Father   . Sickle cell trait Father     Social History   Socioeconomic History  . Marital status: Single    Spouse name: Not on file  . Number of children: Not on file  . Years of education: Not on file  . Highest education level: Not on file  Occupational History  . Not on file  Tobacco Use  . Smoking status: Never Smoker  . Smokeless tobacco: Never Used  Substance and Sexual Activity  . Alcohol use: Not Currently  . Drug use: Never  . Sexual activity: Yes    Birth control/protection: None  Other Topics Concern  . Not on file  Social History Narrative  . Not on file   Social Determinants of Health   Financial Resource Strain:   . Difficulty of Paying Living Expenses: Not on file  Food Insecurity:   . Worried About Programme researcher, broadcasting/film/video in the Last Year: Not on file  . Ran Out of Food in the Last Year: Not on file  Transportation Needs:   . Lack of Transportation (Medical): Not on file  . Lack of Transportation (Non-Medical): Not on file  Physical Activity:   . Days of Exercise per Week: Not on file  . Minutes of Exercise per Session: Not on file  Stress:   . Feeling of Stress : Not on file  Social Connections:   . Frequency of Communication with Friends and Family: Not on file  . Frequency of Social Gatherings with Friends and Family: Not on file  .  Attends Religious Services: Not on file  . Active Member of Clubs or Organizations: Not on file  . Attends Archivist Meetings: Not on file  . Marital Status: Not on file  Intimate Partner Violence:   . Fear of Current or Ex-Partner: Not on file  . Emotionally Abused: Not on file  . Physically Abused: Not on file  . Sexually Abused: Not on file    Outpatient Medications Prior to Visit  Medication Sig Dispense Refill  . docusate sodium (COLACE) 100 MG capsule Take 1 capsule (100 mg total) by mouth 2 (two) times daily. 10 capsule 0  . DULoxetine (CYMBALTA) 20 MG capsule TAKE 1 CAPSULE  BY MOUTH EVERY DAY 90 capsule 1  . folic acid (FOLVITE) 1 MG tablet Take 1 tablet (1 mg total) by mouth daily. 30 tablet 11  . ibuprofen (ADVIL) 800 MG tablet Take 1 tablet (800 mg total) by mouth every 8 (eight) hours as needed for headache, mild pain, moderate pain or cramping. 30 tablet 3  . ondansetron (ZOFRAN) 4 MG tablet TAKE 1 TABLET BY MOUTH EVERY 8 HOURS AS NEEDED FOR NAUSEA AND VOMITING 30 tablet 2  . oxyCODONE ER (XTAMPZA ER) 13.5 MG C12A Take 13.5 mg by mouth every 12 (twelve) hours. 60 capsule 0  . prochlorperazine (COMPAZINE) 10 MG tablet Take 1 tablet (10 mg total) by mouth every 6 (six) hours as needed for nausea or vomiting. Take if Ondansetron is not effective. 30 tablet 3  . tiZANidine (ZANAFLEX) 4 MG capsule TAKE 1 CAPSULE (4 MG TOTAL) BY MOUTH 3 (THREE) TIMES DAILY. 90 capsule 1  . vitamin B-12 (CYANOCOBALAMIN) 50 MCG tablet Take 1 tablet (50 mcg total) by mouth daily. 30 tablet 6  . Vitamin D, Ergocalciferol, (DRISDOL) 1.25 MG (50000 UT) CAPS capsule Take 1 capsule (50,000 Units total) by mouth every 7 (seven) days. 5 capsule 6  . voxelotor (OXBRYTA) 500 MG TABS tablet Take 1,500 mg by mouth daily. For Sickle Cell Anemia    . Oxycodone HCl 20 MG TABS Take 1 tablet (20 mg total) by mouth every 4 (four) hours as needed (for pain). 90 tablet 0  . hydroxyurea (HYDREA) 500 MG capsule Take 4 capsules (2,000 mg total) by mouth daily. May take with food to minimize GI side effects. (Patient not taking: Reported on 02/11/2019) 120 capsule 5  . naloxone (NARCAN) 0.4 MG/ML injection As needed (Patient not taking: Reported on 02/28/2019) 1 mL 0   No facility-administered medications prior to visit.    No Known Allergies  ROS Review of Systems  Constitutional: Negative.   HENT: Negative.   Eyes: Negative.   Respiratory: Negative.   Cardiovascular: Negative.   Gastrointestinal: Positive for abdominal distention (occaisonal ).  Endocrine: Negative.   Genitourinary: Negative.     Musculoskeletal: Positive for arthralgias (generalize joint pain).  Skin: Negative.   Allergic/Immunologic: Negative.   Neurological: Positive for dizziness (occasional ) and headaches (occasional ).  Hematological: Negative.   Psychiatric/Behavioral: Negative.      Objective:    Physical Exam  Constitutional: She is oriented to person, place, and time. She appears well-developed and well-nourished.  HENT:  Head: Normocephalic and atraumatic.  Eyes: Conjunctivae are normal.  Cardiovascular: Normal rate, regular rhythm, normal heart sounds and intact distal pulses.  Pulmonary/Chest: Effort normal and breath sounds normal.  Abdominal: Soft. Bowel sounds are normal. She exhibits distension (occasional ).  Musculoskeletal:        General: Normal range of motion.  Cervical back: Normal range of motion and neck supple.  Neurological: She is alert and oriented to person, place, and time. She has normal reflexes.  Skin: Skin is warm and dry.  Psychiatric: She has a normal mood and affect. Her behavior is normal. Judgment and thought content normal.  Nursing note and vitals reviewed.   BP 133/77   Pulse (!) 101   Temp 98.3 F (36.8 C)   Ht 5\' 5"  (1.651 m)   Wt 277 lb (125.6 kg)   LMP 02/06/2019   SpO2 100%   BMI 46.10 kg/m  Wt Readings from Last 3 Encounters:  02/28/19 277 lb (125.6 kg)  02/15/19 268 lb 15.4 oz (122 kg)  01/18/19 263 lb 12.8 oz (119.7 kg)     Health Maintenance Due  Topic Date Due  . TETANUS/TDAP  01/12/2002  . PAP SMEAR-Modifier  01/13/2004  . INFLUENZA VACCINE  09/04/2018    There are no preventive care reminders to display for this patient.  Lab Results  Component Value Date   TSH 1.120 11/15/2018   Lab Results  Component Value Date   WBC 12.7 (H) 02/14/2019   HGB 8.9 (L) 02/14/2019   HCT 26.2 (L) 02/14/2019   MCV 73.2 (L) 02/14/2019   PLT 155 02/14/2019   Lab Results  Component Value Date   NA 137 02/13/2019   K 4.1 02/13/2019    CO2 25 02/13/2019   GLUCOSE 110 (H) 02/13/2019   BUN 12 02/13/2019   CREATININE 0.64 02/13/2019   BILITOT 1.3 (H) 02/13/2019   ALKPHOS 84 02/13/2019   AST 22 02/13/2019   ALT 26 02/13/2019   PROT 6.5 02/13/2019   ALBUMIN 3.4 (L) 02/13/2019   CALCIUM 8.6 (L) 02/13/2019   ANIONGAP 8 02/13/2019   Lab Results  Component Value Date   CHOL 84 (L) 11/15/2018   Lab Results  Component Value Date   HDL 33 (L) 11/15/2018   Lab Results  Component Value Date   LDLCALC 34 11/15/2018   Lab Results  Component Value Date   TRIG 84 11/15/2018   Lab Results  Component Value Date   CHOLHDL 2.5 11/15/2018   Lab Results  Component Value Date   HGBA1C 4.1 09/11/2017      Assessment & Plan:   1. Hospital discharge follow-up  2. Hb-SS disease without crisis [facility]) She is doing well today. She will continue to take pain medications as prescribed; will continue to avoid extreme heat and cold; will continue to eat a healthy diet and drink at least 64 ounces of water daily; continue stool softener as needed; will avoid colds and flu; will continue to get plenty of sleep and rest; will continue to avoid high stressful situations and remain infection free; will continue Folic Acid 1 mg daily to avoid sickle cell crisis. Continue to follow up with Hematologist as needed.  - POCT urinalysis dipstick - Ambulatory referral to Hematology  3. Medication management  4. Chronic, continuous use of opioids - [facility] 11+Oxyco+Alc+Crt-Bund - Oxycodone HCl 20 MG TABS; Take 1 tablet (20 mg total) by mouth every 4 (four) hours as needed (for pain).  Dispense: 90 tablet; Refill: 0  5. Chronic pain syndrome - Oxycodone HCl 20 MG TABS; Take 1 tablet (20 mg total) by mouth every 4 (four) hours as needed (for pain).  Dispense: 90 tablet; Refill: 0  6. Anxiety Stable today.   7. Follow up She will follow up with [id number], Darl Pikes today.  She will follow up  in 2 months.   Meds ordered this encounter    Medications  . Oxycodone HCl 20 MG TABS    Sig: Take 1 tablet (20 mg total) by mouth every 4 (four) hours as needed (for pain).    Dispense:  90 tablet    Refill:  0    Order Specific Question:   Supervising Provider    Answer:   Quentin Angst [id number]   Orders Placed This Encounter  Procedures  . [id number] 11+Oxyco+Alc+Crt-Bund  . Ambulatory referral to Hematology  . POCT urinalysis dipstick     Referral Orders     Ambulatory referral to Hematology   [name],  MSN, FNP-BC [facility] [address] [place], [place] [address] [phone]- fax  Problem List Items Addressed This Visit      Other   Anxiety   Chronic pain syndrome   Relevant Medications   Oxycodone HCl 20 MG TABS   Chronic, continuous use of opioids   Relevant Medications   Oxycodone HCl 20 MG TABS   Other Relevant Orders   [id number] 11+Oxyco+Alc+Crt-Bund (Completed)   Hb-SS disease without crisis (HCC)   Relevant Orders   POCT urinalysis dipstick (Completed)   Ambulatory referral to Hematology   Medication management    Other Visit Diagnoses    Hospital discharge follow-up    -  Primary   Follow up          Meds ordered this encounter  Medications  . Oxycodone HCl 20 MG TABS    Sig: Take 1 tablet (20 mg total) by mouth every 4 (four) hours as needed (for pain).    Dispense:  90 tablet    Refill:  0    Order Specific Question:   Supervising Provider    Answer:   Quentin Angst [[id number]]    Follow-up: Return in about 2 months (around 04/28/2019).    Kallie Locks, FNP

## 2019-03-03 LAB — DRUG SCREEN 764883 11+OXYCO+ALC+CRT-BUND
Amphetamines, Urine: NEGATIVE ng/mL
BENZODIAZ UR QL: NEGATIVE ng/mL
Barbiturate: NEGATIVE ng/mL
Cannabinoid Quant, Ur: NEGATIVE ng/mL
Cocaine (Metabolite): NEGATIVE ng/mL
Creatinine: 121.1 mg/dL (ref 20.0–300.0)
Ethanol: NEGATIVE %
Meperidine: NEGATIVE ng/mL
Methadone Screen, Urine: NEGATIVE ng/mL
Phencyclidine: NEGATIVE ng/mL
Propoxyphene: NEGATIVE ng/mL
Tramadol: NEGATIVE ng/mL
pH, Urine: 5.8 (ref 4.5–8.9)

## 2019-03-03 LAB — OXYCODONE/OXYMORPHONE, CONFIRM
OXYCODONE/OXYMORPH: POSITIVE — AB
OXYCODONE: 982 ng/mL
OXYCODONE: POSITIVE — AB
OXYMORPHONE (GC/MS): 2212 ng/mL
OXYMORPHONE: POSITIVE — AB

## 2019-03-03 LAB — OPIATES CONFIRMATION, URINE: Opiates: NEGATIVE ng/mL

## 2019-03-08 ENCOUNTER — Telehealth: Payer: Self-pay | Admitting: Family Medicine

## 2019-03-08 NOTE — Telephone Encounter (Signed)
Sent to NP 

## 2019-03-09 ENCOUNTER — Telehealth: Payer: Self-pay

## 2019-03-09 ENCOUNTER — Other Ambulatory Visit: Payer: Self-pay | Admitting: Family Medicine

## 2019-03-09 DIAGNOSIS — D571 Sickle-cell disease without crisis: Secondary | ICD-10-CM

## 2019-03-09 DIAGNOSIS — F1190 Opioid use, unspecified, uncomplicated: Secondary | ICD-10-CM

## 2019-03-09 DIAGNOSIS — G894 Chronic pain syndrome: Secondary | ICD-10-CM

## 2019-03-09 MED ORDER — XTAMPZA ER 13.5 MG PO C12A: 13.5000 mg | EXTENDED_RELEASE_CAPSULE | Freq: Two times a day (BID) | ORAL | 0 refills | Status: DC

## 2019-03-09 NOTE — Telephone Encounter (Signed)
Vernetta want to know if her Xtampza ER has been sent to the pharmacy?

## 2019-03-10 ENCOUNTER — Other Ambulatory Visit: Payer: Self-pay | Admitting: Family Medicine

## 2019-03-10 DIAGNOSIS — G894 Chronic pain syndrome: Secondary | ICD-10-CM

## 2019-03-10 DIAGNOSIS — F1190 Opioid use, unspecified, uncomplicated: Secondary | ICD-10-CM

## 2019-03-10 MED ORDER — OXYCODONE HCL 20 MG PO TABS: 20.0000 mg | ORAL_TABLET | ORAL | 0 refills | Status: DC | PRN

## 2019-03-14 ENCOUNTER — Telehealth: Payer: Self-pay | Admitting: Family Medicine

## 2019-03-15 ENCOUNTER — Other Ambulatory Visit: Payer: Self-pay | Admitting: Family Medicine

## 2019-03-15 DIAGNOSIS — Z79899 Other long term (current) drug therapy: Secondary | ICD-10-CM

## 2019-03-15 DIAGNOSIS — G894 Chronic pain syndrome: Secondary | ICD-10-CM

## 2019-03-15 DIAGNOSIS — D5700 Hb-SS disease with crisis, unspecified: Secondary | ICD-10-CM

## 2019-03-15 NOTE — Telephone Encounter (Signed)
done

## 2019-03-18 ENCOUNTER — Other Ambulatory Visit: Payer: Self-pay | Admitting: Family Medicine

## 2019-03-18 DIAGNOSIS — D571 Sickle-cell disease without crisis: Secondary | ICD-10-CM

## 2019-03-18 DIAGNOSIS — M62838 Other muscle spasm: Secondary | ICD-10-CM

## 2019-03-18 DIAGNOSIS — R110 Nausea: Secondary | ICD-10-CM

## 2019-03-25 ENCOUNTER — Other Ambulatory Visit: Payer: Self-pay | Admitting: Family Medicine

## 2019-03-25 ENCOUNTER — Telehealth: Payer: Self-pay | Admitting: Family Medicine

## 2019-03-25 DIAGNOSIS — G894 Chronic pain syndrome: Secondary | ICD-10-CM

## 2019-03-25 DIAGNOSIS — F1190 Opioid use, unspecified, uncomplicated: Secondary | ICD-10-CM

## 2019-03-25 MED ORDER — OXYCODONE HCL 20 MG PO TABS: 20.0000 mg | ORAL_TABLET | ORAL | 0 refills | Status: DC | PRN

## 2019-03-25 NOTE — Telephone Encounter (Signed)
error 

## 2019-03-28 NOTE — Telephone Encounter (Signed)
done

## 2019-03-29 ENCOUNTER — Encounter: Payer: Self-pay | Admitting: Family Medicine

## 2019-03-31 ENCOUNTER — Other Ambulatory Visit: Payer: Self-pay

## 2019-03-31 ENCOUNTER — Ambulatory Visit (INDEPENDENT_AMBULATORY_CARE_PROVIDER_SITE_OTHER): Payer: Medicaid Other | Admitting: Clinical

## 2019-03-31 DIAGNOSIS — F419 Anxiety disorder, unspecified: Secondary | ICD-10-CM | POA: Diagnosis not present

## 2019-03-31 NOTE — BH Specialist Note (Signed)
Integrated Behavioral Health Visit via Telemedicine (Telephone)  03/31/2019 Stephanie Burch [id number]   Session Start time: 10:00  Session End time: 11:00 Total time: 60  Referring Provider: [place] Hollis, NP Type of Visit: Telephonic Patient location: [address] [facility] Provider location: [facility] All persons participating in visit: patient, CSW  Session #: 9/10  Confirmed patient's address: Yes  Confirmed patient's phone number: Yes  Any changes to demographics: No   Confirmed patient's insurance: Yes  Any changes to patient's insurance: No   Discussed confidentiality: Yes    The following statements were read to the patient and/or legal guardian that are established with the [facility] Provider.  "The purpose of this phone visit is to provide behavioral health care while limiting exposure to the coronavirus (COVID19).  There is a possibility of technology failure and discussed alternative modes of communication if that failure occurs."  "By engaging in this telephone visit, you consent to the provision of healthcare.  Additionally, you authorize for your insurance to be billed for the services provided during this telephone visit."   Patient and/or legal guardian consented to telephone visit: Yes   PRESENTING CONCERNS: Patient and/or family reports the following symptoms/concerns: depression, anxiety, challenges coping with pain, stress leading to pain Duration of problem: approx two years; Severity of problem: moderate  GOALS ADDRESSED: Patient will: 1.  Reduce symptoms of: anxiety, depression and stress  2.  Increase knowledge and/or ability of: coping skills and self-management skills  3.  Demonstrate ability to: Increase healthy adjustment to current life circumstances  INTERVENTIONS: Interventions utilized:  Supportive Counseling and CBT, ACT Standardized Assessments completed: Not Needed  ASSESSMENT: Patient currently  experiencing stress related to managing chronic pain and related to intimate relationships. Today provided supportive counseling around recent conflicts with partner. CBT/ACT exercise to practice recognizing automatic thoughts and core beliefs about self in relationships and gaining distance from thoughts. Patient exhibited increased insight into unhealthy patterns and core beliefs in relationships.  Patient may benefit from continued supportive counseling including CBT and ACT approaches to challenging and thoughts and core beliefs about self, relationships, and pain.  PLAN: 1. Follow up with behavioral health clinician on : 04/27/19 2. Behavioral recommendations: practice CBT/ACT thought de-fusion exercise  3. Referral(s): Integrated [employer] (In Clinic)  Abigail Butts, LCSW [facility] [phone]

## 2019-04-05 ENCOUNTER — Other Ambulatory Visit: Payer: Self-pay | Admitting: Family Medicine

## 2019-04-05 ENCOUNTER — Telehealth: Payer: Self-pay | Admitting: Family Medicine

## 2019-04-05 DIAGNOSIS — F1190 Opioid use, unspecified, uncomplicated: Secondary | ICD-10-CM

## 2019-04-05 DIAGNOSIS — G894 Chronic pain syndrome: Secondary | ICD-10-CM

## 2019-04-05 DIAGNOSIS — D571 Sickle-cell disease without crisis: Secondary | ICD-10-CM

## 2019-04-05 MED ORDER — XTAMPZA ER 13.5 MG PO C12A: 13.5000 mg | EXTENDED_RELEASE_CAPSULE | Freq: Two times a day (BID) | ORAL | 0 refills | Status: DC

## 2019-04-06 NOTE — Telephone Encounter (Signed)
Done

## 2019-04-07 ENCOUNTER — Telehealth: Payer: Self-pay | Admitting: Family Medicine

## 2019-04-07 ENCOUNTER — Telehealth (HOSPITAL_COMMUNITY): Payer: Self-pay | Admitting: *Deleted

## 2019-04-07 NOTE — Telephone Encounter (Signed)
Patient called requesting to come to the day hospital for sickle cell pain. Patient reports bilateral hip and leg pain rated 7/10. Reports taking Ibuprofen for pain and reports being out of narcotic medications for "a couple of days". Patient's pain medication is due tomorrow and patient has contacted provider to see if she can fill it today. COVID-19 screening done and patient denies all symptoms and exposures. Patient denies fever, chest pain, vomiting and abdominal pain. Patient admits to having nausea and diarrhea of unknown cause. Provider notified and due to patient's diarrhea and nausea, patient is not appropriate to come to the day hospital. Patient advised to continue to follow up with primary provider for pain medications, alternate Tylenol and Ibuprofen, and to hydrate with water and Gatoraid. Patient advised and expresses an understanding.

## 2019-04-07 NOTE — Telephone Encounter (Signed)
Pt asked if her xtampza can be filled 1 day sooner. Today, instead of tomorrow.

## 2019-04-09 ENCOUNTER — Other Ambulatory Visit: Payer: Self-pay | Admitting: Family Medicine

## 2019-04-09 DIAGNOSIS — D5700 Hb-SS disease with crisis, unspecified: Secondary | ICD-10-CM

## 2019-04-09 DIAGNOSIS — G894 Chronic pain syndrome: Secondary | ICD-10-CM

## 2019-04-09 DIAGNOSIS — Z79899 Other long term (current) drug therapy: Secondary | ICD-10-CM

## 2019-04-10 ENCOUNTER — Other Ambulatory Visit: Payer: Self-pay | Admitting: Family Medicine

## 2019-04-10 DIAGNOSIS — F1190 Opioid use, unspecified, uncomplicated: Secondary | ICD-10-CM

## 2019-04-10 DIAGNOSIS — G894 Chronic pain syndrome: Secondary | ICD-10-CM

## 2019-04-10 DIAGNOSIS — D571 Sickle-cell disease without crisis: Secondary | ICD-10-CM

## 2019-04-15 ENCOUNTER — Telehealth (HOSPITAL_COMMUNITY): Payer: Self-pay | Admitting: *Deleted

## 2019-04-15 NOTE — Telephone Encounter (Signed)
Patient called requesting to come to the day hospital for sickle cell pain. Reports left leg and left side pain rated 10/10. Reports taking Oxycodone last night at 9:30 pm but says that it has not been helping to control the pain. COVID-19 screening done and patient denies all symptoms and exposures. Denies fever, chest pain, nausea, vomiting, diarrhea and abdominal pain. Admits to having transportation without driving self. [place], FNP notified. Patient can come to the day hospital for pain management. Patient advised and expresses an understanding.

## 2019-04-15 NOTE — Telephone Encounter (Signed)
Called patient to inquire if she was still planning to come to the day hospital for sickle cell pain management today. Patient advised that she would not be able to receive full treatment in the day hospital if she did not arrive within the hour. Patient reports that she was unable to find a ride to bring her to the day clinic and she would not be able to come today.  Patient reports that she is now planning to go to the ER once she finds transportation.

## 2019-04-18 ENCOUNTER — Other Ambulatory Visit: Payer: Self-pay

## 2019-04-18 ENCOUNTER — Inpatient Hospital Stay (HOSPITAL_COMMUNITY)
Admission: AD | Admit: 2019-04-18 | Discharge: 2019-04-22 | DRG: 812 | Disposition: A | Discharge status: Home / Self Care | Payer: Medicaid Other | Source: Ambulatory Visit | Attending: Internal Medicine | Admitting: Internal Medicine

## 2019-04-18 ENCOUNTER — Encounter (HOSPITAL_COMMUNITY): Payer: Self-pay | Admitting: Internal Medicine

## 2019-04-18 ENCOUNTER — Telehealth: Payer: Self-pay | Admitting: Family Medicine

## 2019-04-18 ENCOUNTER — Non-Acute Institutional Stay (HOSPITAL_COMMUNITY): Payer: Medicaid Other

## 2019-04-18 ENCOUNTER — Telehealth (HOSPITAL_COMMUNITY): Payer: Self-pay | Admitting: General Practice

## 2019-04-18 DIAGNOSIS — Z8249 Family history of ischemic heart disease and other diseases of the circulatory system: Secondary | ICD-10-CM

## 2019-04-18 DIAGNOSIS — E559 Vitamin D deficiency, unspecified: Secondary | ICD-10-CM | POA: Diagnosis present

## 2019-04-18 DIAGNOSIS — F1120 Opioid dependence, uncomplicated: Secondary | ICD-10-CM | POA: Diagnosis present

## 2019-04-18 DIAGNOSIS — F329 Major depressive disorder, single episode, unspecified: Secondary | ICD-10-CM | POA: Diagnosis present

## 2019-04-18 DIAGNOSIS — M87821 Other osteonecrosis, right humerus: Secondary | ICD-10-CM | POA: Diagnosis present

## 2019-04-18 DIAGNOSIS — D5700 Hb-SS disease with crisis, unspecified: Secondary | ICD-10-CM | POA: Diagnosis present

## 2019-04-18 DIAGNOSIS — Z832 Family history of diseases of the blood and blood-forming organs and certain disorders involving the immune mechanism: Secondary | ICD-10-CM | POA: Diagnosis not present

## 2019-04-18 DIAGNOSIS — M87822 Other osteonecrosis, left humerus: Secondary | ICD-10-CM | POA: Diagnosis present

## 2019-04-18 DIAGNOSIS — R911 Solitary pulmonary nodule: Secondary | ICD-10-CM | POA: Diagnosis present

## 2019-04-18 DIAGNOSIS — B373 Candidiasis of vulva and vagina: Secondary | ICD-10-CM | POA: Diagnosis not present

## 2019-04-18 DIAGNOSIS — R071 Chest pain on breathing: Secondary | ICD-10-CM

## 2019-04-18 DIAGNOSIS — Z6841 Body Mass Index (BMI) 40.0 and over, adult: Secondary | ICD-10-CM | POA: Diagnosis present

## 2019-04-18 DIAGNOSIS — R0602 Shortness of breath: Secondary | ICD-10-CM | POA: Diagnosis not present

## 2019-04-18 DIAGNOSIS — G894 Chronic pain syndrome: Secondary | ICD-10-CM | POA: Diagnosis not present

## 2019-04-18 DIAGNOSIS — R350 Frequency of micturition: Secondary | ICD-10-CM | POA: Diagnosis not present

## 2019-04-18 DIAGNOSIS — D72829 Elevated white blood cell count, unspecified: Secondary | ICD-10-CM | POA: Diagnosis present

## 2019-04-18 DIAGNOSIS — N898 Other specified noninflammatory disorders of vagina: Secondary | ICD-10-CM | POA: Diagnosis not present

## 2019-04-18 DIAGNOSIS — Z833 Family history of diabetes mellitus: Secondary | ICD-10-CM

## 2019-04-18 DIAGNOSIS — E538 Deficiency of other specified B group vitamins: Secondary | ICD-10-CM | POA: Diagnosis present

## 2019-04-18 DIAGNOSIS — D57219 Sickle-cell/Hb-C disease with crisis, unspecified: Secondary | ICD-10-CM | POA: Diagnosis present

## 2019-04-18 DIAGNOSIS — Z79899 Other long term (current) drug therapy: Secondary | ICD-10-CM | POA: Diagnosis not present

## 2019-04-18 DIAGNOSIS — R079 Chest pain, unspecified: Secondary | ICD-10-CM | POA: Diagnosis not present

## 2019-04-18 DIAGNOSIS — Z20822 Contact with and (suspected) exposure to covid-19: Secondary | ICD-10-CM | POA: Diagnosis present

## 2019-04-18 LAB — RETICULOCYTES
Immature Retic Fract: 49.2 % — ABNORMAL HIGH (ref 2.3–15.9)
RBC.: 4.11 MIL/uL (ref 3.87–5.11)
Retic Count, Absolute: 204 10*3/uL — ABNORMAL HIGH (ref 19.0–186.0)
Retic Ct Pct: 5.2 % — ABNORMAL HIGH (ref 0.4–3.1)

## 2019-04-18 LAB — CBC WITH DIFFERENTIAL/PLATELET
Abs Immature Granulocytes: 0.08 10*3/uL — ABNORMAL HIGH (ref 0.00–0.07)
Basophils Absolute: 0 10*3/uL (ref 0.0–0.1)
Basophils Relative: 0 %
Eosinophils Absolute: 0.2 10*3/uL (ref 0.0–0.5)
Eosinophils Relative: 1 %
HCT: 30.3 % — ABNORMAL LOW (ref 36.0–46.0)
Hemoglobin: 9.8 g/dL — ABNORMAL LOW (ref 12.0–15.0)
Immature Granulocytes: 1 %
Lymphocytes Relative: 14 %
Lymphs Abs: 1.9 10*3/uL (ref 0.7–4.0)
MCH: 23.4 pg — ABNORMAL LOW (ref 26.0–34.0)
MCHC: 32.3 g/dL (ref 30.0–36.0)
MCV: 72.5 fL — ABNORMAL LOW (ref 80.0–100.0)
Monocytes Absolute: 1.1 10*3/uL — ABNORMAL HIGH (ref 0.1–1.0)
Monocytes Relative: 9 %
Neutro Abs: 9.9 10*3/uL — ABNORMAL HIGH (ref 1.7–7.7)
Neutrophils Relative %: 75 %
Platelets: 265 10*3/uL (ref 150–400)
RBC: 4.18 MIL/uL (ref 3.87–5.11)
RDW: 16.1 % — ABNORMAL HIGH (ref 11.5–15.5)
WBC: 13.2 10*3/uL — ABNORMAL HIGH (ref 4.0–10.5)
nRBC: 9.4 % — ABNORMAL HIGH (ref 0.0–0.2)

## 2019-04-18 LAB — COMPREHENSIVE METABOLIC PANEL
ALT: 33 U/L (ref 0–44)
AST: 23 U/L (ref 15–41)
Albumin: 3.4 g/dL — ABNORMAL LOW (ref 3.5–5.0)
Alkaline Phosphatase: 66 U/L (ref 38–126)
Anion gap: 10 (ref 5–15)
BUN: 8 mg/dL (ref 6–20)
CO2: 29 mmol/L (ref 22–32)
Calcium: 9 mg/dL (ref 8.9–10.3)
Chloride: 105 mmol/L (ref 98–111)
Creatinine, Ser: 0.74 mg/dL (ref 0.44–1.00)
GFR calc Af Amer: >60 mL/min (ref >60)
GFR calc non Af Amer: >60 mL/min (ref >60)
Glucose, Bld: 106 mg/dL — ABNORMAL HIGH (ref 70–99)
Potassium: 3.7 mmol/L (ref 3.5–5.1)
Sodium: 144 mmol/L (ref 135–145)
Total Bilirubin: 1.4 mg/dL — ABNORMAL HIGH (ref 0.3–1.2)
Total Protein: 7.3 g/dL (ref 6.5–8.1)

## 2019-04-18 LAB — PREGNANCY, URINE: Preg Test, Ur: NEGATIVE

## 2019-04-18 LAB — URINALYSIS, COMPLETE (UACMP) WITH MICROSCOPIC
Bilirubin Urine: NEGATIVE
Glucose, UA: NEGATIVE mg/dL
Hgb urine dipstick: NEGATIVE
Ketones, ur: NEGATIVE mg/dL
Leukocytes,Ua: NEGATIVE
Nitrite: NEGATIVE
Protein, ur: 30 mg/dL — AB
Specific Gravity, Urine: 1.013 (ref 1.005–1.030)
pH: 6 (ref 5.0–8.0)

## 2019-04-18 LAB — RAPID URINE DRUG SCREEN, HOSP PERFORMED
Amphetamines: NOT DETECTED
Barbiturates: NOT DETECTED
Benzodiazepines: NOT DETECTED
Cocaine: NOT DETECTED
Opiates: POSITIVE — AB
Tetrahydrocannabinol: NOT DETECTED

## 2019-04-18 LAB — HIV ANTIBODY (ROUTINE TESTING W REFLEX): HIV Screen 4th Generation wRfx: NONREACTIVE

## 2019-04-18 MED ORDER — FOLIC ACID 1 MG PO TABS
1.0000 mg | ORAL_TABLET | Freq: Every day | ORAL | Status: DC
  Administered 2019-04-18 – 2019-04-22 (×5): 1 mg via ORAL
  Filled 2019-04-18 (×5): qty 1

## 2019-04-18 MED ORDER — NALOXONE HCL 0.4 MG/ML IJ SOLN: 0.4000 mg | INTRAMUSCULAR | Status: DC | PRN

## 2019-04-18 MED ORDER — KETOROLAC TROMETHAMINE 30 MG/ML IJ SOLN
15.0000 mg | Freq: Once | INTRAMUSCULAR | Status: AC
  Administered 2019-04-18: 15 mg via INTRAVENOUS
  Filled 2019-04-18: qty 1

## 2019-04-18 MED ORDER — VOXELOTOR 500 MG PO TABS
1500.0000 mg | ORAL_TABLET | Freq: Every day | ORAL | Status: DC
  Administered 2019-04-19 – 2019-04-22 (×4): 1500 mg via ORAL

## 2019-04-18 MED ORDER — DULOXETINE HCL 20 MG PO CPEP
20.0000 mg | ORAL_CAPSULE | Freq: Every day | ORAL | Status: DC
  Administered 2019-04-18 – 2019-04-22 (×5): 20 mg via ORAL
  Filled 2019-04-18 (×6): qty 1

## 2019-04-18 MED ORDER — ONDANSETRON HCL 4 MG/2ML IJ SOLN
4.0000 mg | Freq: Four times a day (QID) | INTRAMUSCULAR | Status: DC | PRN
  Administered 2019-04-18: 4 mg via INTRAVENOUS
  Filled 2019-04-18: qty 2

## 2019-04-18 MED ORDER — HYDROMORPHONE 1 MG/ML IV SOLN
INTRAVENOUS | Status: DC
  Administered 2019-04-18: 17.8 mg via INTRAVENOUS
  Administered 2019-04-18: 5.4 mg via INTRAVENOUS
  Administered 2019-04-18 (×2): 30 mg via INTRAVENOUS
  Administered 2019-04-19: 10.8 mg via INTRAVENOUS
  Administered 2019-04-19: 30 mg via INTRAVENOUS
  Administered 2019-04-19: 6.6 mg via INTRAVENOUS
  Administered 2019-04-19: 8.1 mg via INTRAVENOUS
  Filled 2019-04-18 (×3): qty 30

## 2019-04-18 MED ORDER — ENOXAPARIN SODIUM 40 MG/0.4ML ~~LOC~~ SOLN
40.0000 mg | SUBCUTANEOUS | Status: DC
  Administered 2019-04-18 – 2019-04-21 (×4): 40 mg via SUBCUTANEOUS
  Filled 2019-04-18 (×5): qty 0.4

## 2019-04-18 MED ORDER — TIZANIDINE HCL 4 MG PO TABS
4.0000 mg | ORAL_TABLET | Freq: Three times a day (TID) | ORAL | Status: DC
  Administered 2019-04-18 – 2019-04-22 (×11): 4 mg via ORAL
  Filled 2019-04-18 (×11): qty 1

## 2019-04-18 MED ORDER — POLYETHYLENE GLYCOL 3350 17 G PO PACK
17.0000 g | PACK | Freq: Every day | ORAL | Status: DC | PRN
  Administered 2019-04-21 (×2): 17 g via ORAL
  Filled 2019-04-18 (×2): qty 1

## 2019-04-18 MED ORDER — SENNOSIDES-DOCUSATE SODIUM 8.6-50 MG PO TABS
1.0000 | ORAL_TABLET | Freq: Two times a day (BID) | ORAL | Status: DC
  Administered 2019-04-19 – 2019-04-22 (×7): 1 via ORAL
  Filled 2019-04-18 (×8): qty 1

## 2019-04-18 MED ORDER — SODIUM CHLORIDE 0.9% FLUSH: 9.0000 mL | INTRAVENOUS | Status: DC | PRN

## 2019-04-18 MED ORDER — OXYCODONE HCL 5 MG PO TABS: 20.0000 mg | ORAL_TABLET | ORAL | Status: DC | PRN

## 2019-04-18 MED ORDER — SODIUM CHLORIDE 0.45 % IV SOLN: INTRAVENOUS | Status: DC

## 2019-04-18 MED ORDER — HYDROXYUREA 500 MG PO CAPS
2000.0000 mg | ORAL_CAPSULE | Freq: Every day | ORAL | Status: DC
  Administered 2019-04-20 – 2019-04-21 (×2): 2000 mg via ORAL
  Filled 2019-04-18 (×5): qty 4

## 2019-04-18 MED ORDER — ACETAMINOPHEN 500 MG PO TABS
1000.0000 mg | ORAL_TABLET | Freq: Once | ORAL | Status: AC
  Administered 2019-04-18: 1000 mg via ORAL
  Filled 2019-04-18: qty 2

## 2019-04-18 MED ORDER — DIPHENHYDRAMINE HCL 25 MG PO CAPS: 25.0000 mg | ORAL_CAPSULE | ORAL | Status: DC | PRN

## 2019-04-18 MED ORDER — KETOROLAC TROMETHAMINE 15 MG/ML IJ SOLN
15.0000 mg | Freq: Four times a day (QID) | INTRAMUSCULAR | Status: DC
  Administered 2019-04-19 – 2019-04-22 (×15): 15 mg via INTRAVENOUS
  Filled 2019-04-18 (×15): qty 1

## 2019-04-18 NOTE — Discharge Instructions (Signed)
Sickle Cell Anemia, Adult  Sickle cell anemia is a condition where your red blood cells are shaped like sickles. Red blood cells carry oxygen through the body. Sickle-shaped cells do not live as long as normal red blood cells. They also clump together and block blood from flowing through the blood vessels. This prevents the body from getting enough oxygen. Sickle cell anemia causes organ damage and pain. It also increases the risk of infection. Follow these instructions at home: Medicines  Take over-the-counter and prescription medicines only as told by your doctor.  If you were prescribed an antibiotic medicine, take it as told by your doctor. Do not stop taking the antibiotic even if you start to feel better.  If you develop a fever, do not take medicines to lower the fever right away. Tell your doctor about the fever. Managing pain, stiffness, and swelling  Try these methods to help with pain: ? Use a heating pad. ? Take a warm bath. ? Distract yourself, such as by watching TV. Eating and drinking  Drink enough fluid to keep your pee (urine) clear or pale yellow. Drink more in hot weather and during exercise.  Limit or avoid alcohol.  Eat a healthy diet. Eat plenty of fruits, vegetables, whole grains, and lean protein.  Take vitamins and supplements as told by your doctor. Traveling  When traveling, keep these with you: ? Your medical information. ? The names of your doctors. ? Your medicines.  If you need to take an airplane, talk to your doctor first. Activity  Rest often.  Avoid exercises that make your heart beat much faster, such as jogging. General instructions  Do not use products that have nicotine or tobacco, such as cigarettes and e-cigarettes. If you need help quitting, ask your doctor.  Consider wearing a medical alert bracelet.  Avoid being in high places (high altitudes), such as mountains.  Avoid very hot or cold temperatures.  Avoid places where the  temperature changes a lot.  Keep all follow-up visits as told by your doctor. This is important. Contact a doctor if:  A joint hurts.  Your feet or hands hurt or swell.  You feel tired (fatigued). Get help right away if:  You have symptoms of infection. These include: ? Fever. ? Chills. ? Being very tired. ? Irritability. ? Poor eating. ? Throwing up (vomiting).  You feel dizzy or faint.  You have new stomach pain, especially on the left side.  You have a an erection (priapism) that lasts more than 4 hours.  You have numbness in your arms or legs.  You have a hard time moving your arms or legs.  You have trouble talking.  You have pain that does not go away when you take medicine.  You are short of breath.  You are breathing fast.  You have a long-term cough.  You have pain in your chest.  You have a bad headache.  You have a stiff neck.  Your stomach looks bloated even though you did not eat much.  Your skin is pale.  You suddenly cannot see well. Summary  Sickle cell anemia is a condition where your red blood cells are shaped like sickles.  Follow your doctor's advice on ways to manage pain, food to eat, activities to do, and steps to take for safe travel.  Get medical help right away if you have any signs of infection, such as a fever. This information is not intended to replace advice given to you by   your health care provider. Make sure you discuss any questions you have with your health care provider. Document Revised: 05/14/2018 Document Reviewed: 02/26/2016 Elsevier Patient Education  2020 [employer].  

## 2019-04-18 NOTE — H&P (Addendum)
H&P  Patient Demographics:  Stephanie Burch, is a 39 y.o. female  MRN: [id number]   DOB - April 01, 1980  Admit Date - 04/18/2019  Outpatient Primary MD for the patient is Azzie Glatter, FNP      HPI:   Stephanie Burch  is a 39 y.o. female with a medical history significant for sickle cell disease, chronic pain syndrome, opiate dependence and tolerance, history of anemia of chronic disease, history of depression, and history of morbid obesity presents complaining of left side and bilateral lower extremity pain.  Bilateral lower extremity pain is consistent with her typical sickle cell pain crisis.  She also endorses increased pain with deep breathing, primarily to left chest.  She says that pain has been present over the past several weeks.  Pain is been unrelieved by home medications.  She admits to taking more pain medication than prescribed.  Pain intensity is 9/10 characterized as constant and throbbing.  She last had oxycodone around 4 AM without sustained relief.  She denies headache, shortness of breath, paresthesias, dizziness, nausea, vomiting, or diarrhea.  Patient has had no sick contacts, she has not traveled recently, or been exposed to anyone with active COVID-19 infection.  Sickle cell day infusion center course: Patient found to have blood pressure of 137/68, pulse 98, respirations 15, oxygen saturation 97% on RA, and temperature 97.9 F.  Patient underwent chest x-ray, shows no acute cardiopulmonary process.  EKG normal sinus rhythm with T wave abnormality.  Patient's pain persists despite IV Dilaudid PCA, IV fluids, IV Toradol, and Tylenol.  Patient admitted to [place] for further management of sickle cell pain crisis.     Review of systems:  In addition to the HPI above, patient reports Review of Systems  Constitutional: Negative for chills and fever.  HENT: Negative.   Eyes: Negative.   Respiratory: Negative.   Cardiovascular: Positive for chest pain.  Gastrointestinal:  Negative.   Genitourinary: Negative.   Musculoskeletal: Positive for back pain and joint pain.  Skin: Negative.   Neurological: Negative.   Psychiatric/Behavioral: Negative.    A full 10 point Review of Systems was done, except as stated above, all other Review of Systems were negative.  With Past History of the following :   Past Medical History:  Diagnosis Date  . Nausea   . Sickle cell anemia (HCC)   . Vitamin B12 deficiency 12/2018  . Vitamin D deficiency       History reviewed. No pertinent surgical history.   Social History:   Social History   Tobacco Use  . Smoking status: Never Smoker  . Smokeless tobacco: Never Used  Substance Use Topics  . Alcohol use: Not Currently     Lives - At home   Family History :   Family History  Problem Relation Age of Onset  . Hypertension Mother   . Sickle cell trait Mother   . Diabetes Father   . Sickle cell trait Father      Home Medications:   Prior to Admission medications   Medication Sig Start Date End Date Taking? Authorizing Provider  folic acid (FOLVITE) 1 MG tablet Take 1 tablet (1 mg total) by mouth daily. 11/15/18  Yes Azzie Glatter, FNP  ibuprofen (ADVIL) 800 MG tablet TAKE 1 TABLET (800 MG TOTAL) BY MOUTH EVERY 8 (EIGHT) HOURS AS NEEDED FOR HEADACHE, MILD PAIN, MODERATE PAIN OR CRAMPING. 03/21/19  Yes Azzie Glatter, FNP  ondansetron (ZOFRAN) 4 MG tablet TAKE 1 TABLET BY MOUTH EVERY  8 HOURS AS NEEDED FOR NAUSEA AND VOMITING 03/21/19  Yes Kallie Locks, FNP  oxyCODONE ER [facility]) 13.5 MG C12A Take 13.5 mg by mouth every 12 (twelve) hours. 04/08/19 05/08/19 Yes Kallie Locks, FNP  Oxycodone HCl 20 MG TABS Take 1 tablet (20 mg total) by mouth every 4 (four) hours as needed (for pain). 03/29/19  Yes Kallie Locks, FNP  tiZANidine (ZANAFLEX) 4 MG capsule TAKE 1 CAPSULE (4 MG TOTAL) BY MOUTH 3 (THREE) TIMES DAILY. 04/11/19  Yes Kallie Locks, FNP  vitamin B-12 (CYANOCOBALAMIN) 50 MCG tablet Take 1  tablet (50 mcg total) by mouth daily. 12/21/18  Yes Kallie Locks, FNP  Vitamin D, Ergocalciferol, (DRISDOL) 1.25 MG (50000 UT) CAPS capsule Take 1 capsule (50,000 Units total) by mouth every 7 (seven) days. 12/21/18  Yes Kallie Locks, FNP  voxelotor (OXBRYTA) 500 MG TABS tablet Take 1,500 mg by mouth daily. For Sickle Cell Anemia   Yes [provider]  docusate sodium (COLACE) 100 MG capsule Take 1 capsule (100 mg total) by mouth 2 (two) times daily. 09/10/18   Kallie Locks, FNP  DULoxetine (CYMBALTA) 20 MG capsule TAKE 1 CAPSULE BY MOUTH EVERY DAY 10/25/18   Kallie Locks, FNP  hydroxyurea (HYDREA) 500 MG capsule Take 4 capsules (2,000 mg total) by mouth daily. May take with food to minimize GI side effects. Patient not taking: Reported on 02/11/2019 11/15/18   Kallie Locks, FNP  naloxone [facility]) 0.4 MG/ML injection As needed Patient not taking: Reported on 02/28/2019 09/14/18   Kallie Locks, FNP  prochlorperazine (COMPAZINE) 10 MG tablet Take 1 tablet (10 mg total) by mouth every 6 (six) hours as needed for nausea or vomiting. Take if Ondansetron is not effective. 11/15/18   Kallie Locks, FNP     Allergies:   No Known Allergies   Physical Exam:   Vitals:   Vitals:   04/18/19 1242 04/18/19 1532  BP: 140/70 137/68  Pulse: 100 98  Resp: 14 15  Temp: 97.9 F (36.6 C)   SpO2: 96% 97%    Physical Exam: Constitutional: Patient appears well-developed and well-nourished. Not in obvious distress. Obesity HENT: Normocephalic, atraumatic, External right and left ear normal. Oropharynx is clear and moist.  Eyes: Conjunctivae and EOM are normal. PERRLA, no scleral icterus. Neck: Normal ROM. Neck supple. No JVD. No tracheal deviation. No thyromegaly. CVS: RRR, S1/S2 +, no murmurs, no gallops, no carotid bruit.  Pulmonary: Effort and breath sounds normal, no stridor, rhonchi, wheezes, rales.  Abdominal: Soft. BS +, no distension, tenderness, rebound or  guarding.  Musculoskeletal: Normal range of motion. No edema and no tenderness.  Lymphadenopathy: No lymphadenopathy noted, cervical, inguinal or axillary Neuro: Alert. Normal reflexes, muscle tone coordination. No cranial nerve deficit. Skin: Skin is warm and dry. No rash noted. Not diaphoretic. No erythema. No pallor. Psychiatric: Normal mood and affect. Behavior, judgment, thought content normal.   Data Review:   CBC Recent Labs  Lab 04/18/19 0956  WBC 13.2*  HGB 9.8*  HCT 30.3*  PLT 265  MCV 72.5*  MCH 23.4*  MCHC 32.3  RDW 16.1*  LYMPHSABS 1.9  MONOABS 1.1*  EOSABS 0.2  BASOSABS 0.0   ------------------------------------------------------------------------------------------------------------------  Chemistries  Recent Labs  Lab 04/18/19 0956  NA 144  K 3.7  CL 105  CO2 29  GLUCOSE 106*  BUN 8  CREATININE 0.74  CALCIUM 9.0  AST 23  ALT 33  ALKPHOS 66  BILITOT  1.4*   ------------------------------------------------------------------------------------------------------------------ CrCl cannot be calculated (Unknown ideal weight.). ------------------------------------------------------------------------------------------------------------------ No results for input(s): TSH, T4TOTAL, T3FREE, THYROIDAB in the last 72 hours.  Invalid input(s): FREET3  Coagulation profile No results for input(s): INR, PROTIME in the last 168 hours. ------------------------------------------------------------------------------------------------------------------- No results for input(s): DDIMER in the last 72 hours. -------------------------------------------------------------------------------------------------------------------  Cardiac Enzymes No results for input(s): CKMB, TROPONINI, MYOGLOBIN in the last 168 hours.  Invalid input(s): CK ------------------------------------------------------------------------------------------------------------------ No results found  for: BNP  ---------------------------------------------------------------------------------------------------------------  Urinalysis    Component Value Date/Time   COLORURINE AMBER (A) 04/18/2019 1005   APPEARANCEUR CLEAR 04/18/2019 1005   LABSPEC 1.013 04/18/2019 1005   PHURINE 6.0 04/18/2019 1005   GLUCOSEU NEGATIVE 04/18/2019 1005   HGBUR NEGATIVE 04/18/2019 1005   BILIRUBINUR NEGATIVE 04/18/2019 1005   BILIRUBINUR Negative 02/28/2019 1043   KETONESUR NEGATIVE 04/18/2019 1005   PROTEINUR 30 (A) 04/18/2019 1005   UROBILINOGEN 0.2 02/28/2019 1043   NITRITE NEGATIVE 04/18/2019 1005   LEUKOCYTESUR NEGATIVE 04/18/2019 1005    ----------------------------------------------------------------------------------------------------------------   Imaging Results:    DG Chest 2 View  Result Date: 04/18/2019 CLINICAL DATA:  Chest pain EXAM: CHEST - 2 VIEW COMPARISON:  November 10, 2018 FINDINGS: Lungs are clear. Heart is upper normal in size with pulmonary vascularity normal. No adenopathy. No pneumothorax. No bone lesions. IMPRESSION: Lungs clear.  Heart upper normal in size.  No evident adenopathy. Electronically Signed   By: Bretta Bang III M.D.   On: 04/18/2019 15:12      Assessment & Plan:  Principal Problem:   Sickle cell pain crisis (HCC) Active Problems:   Leukocytosis   Morbidly obese (HCC)   Chronic pain syndrome   Sickle Cell Disease with pain crisis:  Admit to MedSurg. Continue IV Dilaudid PCA with settings of 0.6 mg, 10-minute lockout, and 4 mg/h. IV Toradol 15 mg every 6 hours for total of 5 days Monitor vital signs closely, reevaluate pain scale regularly, 2 L supplemental oxygen as needed. Patient will be reevaluated for pain in context of functioning in relationship to baseline as her care progresses.  Leukocytosis: Mild leukocytosis of 13.5.  Suspected to be reactive.  Patient is afebrile without signs of infection or inflammation.  Continue to monitor  without antibiotics.  CBC in a.m.  Chronic pain syndrome: OxyContin 15 mg every 12 hours Hold oxycodone, use PCA Dilaudid as substitute  Morbid obesity: Heart healthy diet.  Patient counseled.  History of depression: Continue Cymbalta.  Patient denies any suicidal or homicidal intent at this time.  Left side pain varying with breathing: Chest x-ray shows no acute cardiopulmonary process.  EKG shows normal sinus rhythm.  Continue to follow closely.  DVT Prophylaxis: Subcut Lovenox and SCDs  AM Labs Ordered, also please review Full Orders  Family Communication: Admission, patient's condition and plan of care including tests being ordered have been discussed with the patient who indicate understanding and agree with the plan and Code Status.  Code Status: Full Code  Consults called: None    Admission status: Inpatient    Time spent in minutes : 50 minutes  Nolon Nations  APRN, MSN, FNP-C Patient Care [facility] [address] [place], [place] [address] [phone]  04/18/2019 at 4:19 PM

## 2019-04-18 NOTE — H&P (Addendum)
[facility] History and Physical   Date: 04/18/2019  Patient name: Stephanie Burch Medical record number: [id number] Date of birth: 26-Dec-1980 Age: 39 y.o. Gender: female PCP: Kallie Locks, FNP  Attending physician: Quentin Angst, MD  Chief Complaint: Sickle cell pain  History of Present Illness: Stephanie Burch, a 39 year old female with a medical history significant for sickle cell disease type [place], chronic pain syndrome, opiate dependence and tolerance, history of anemia of chronic disease, history of depression, and history of morbid obesity presents complaining of left side and bilateral lower extremity pain.  Bilateral lower extremity pain is consistent with typical sickle cell crisis.  She endorses increased pain with deep breathing, primarily on left.  She says that pain intensity has been increased over the past several weeks.  Pain is been unrelieved by home medications.  She admits to taking more medication than prescribed.  Pain intensity 9/10 characterized as constant and throbbing.  Patient last had oxycodone around 4 AM without sustained relief.  She denies headache, shortness of breath, paresthesias, dizziness, nausea, vomiting, or diarrhea.  No sick  Meds: Medications Prior to Admission  Medication Sig Dispense Refill Last Dose  . docusate sodium (COLACE) 100 MG capsule Take 1 capsule (100 mg total) by mouth 2 (two) times daily. 10 capsule 0   . DULoxetine (CYMBALTA) 20 MG capsule TAKE 1 CAPSULE BY MOUTH EVERY DAY 90 capsule 1   . folic acid (FOLVITE) 1 MG tablet Take 1 tablet (1 mg total) by mouth daily. 30 tablet 11   . hydroxyurea (HYDREA) 500 MG capsule Take 4 capsules (2,000 mg total) by mouth daily. May take with food to minimize GI side effects. (Patient not taking: Reported on 02/11/2019) 120 capsule 5   . ibuprofen (ADVIL) 800 MG tablet TAKE 1 TABLET (800 MG TOTAL) BY MOUTH EVERY 8 (EIGHT) HOURS AS NEEDED FOR HEADACHE, MILD PAIN, MODERATE PAIN OR  CRAMPING. 30 tablet 3   . naloxone (NARCAN) 0.4 MG/ML injection As needed (Patient not taking: Reported on 02/28/2019) 1 mL 0   . ondansetron (ZOFRAN) 4 MG tablet TAKE 1 TABLET BY MOUTH EVERY 8 HOURS AS NEEDED FOR NAUSEA AND VOMITING 30 tablet 2   . oxyCODONE ER (XTAMPZA ER) 13.5 MG C12A Take 13.5 mg by mouth every 12 (twelve) hours. 60 capsule 0   . Oxycodone HCl 20 MG TABS Take 1 tablet (20 mg total) by mouth every 4 (four) hours as needed (for pain). 90 tablet 0   . prochlorperazine (COMPAZINE) 10 MG tablet Take 1 tablet (10 mg total) by mouth every 6 (six) hours as needed for nausea or vomiting. Take if Ondansetron is not effective. 30 tablet 3   . tiZANidine (ZANAFLEX) 4 MG capsule TAKE 1 CAPSULE (4 MG TOTAL) BY MOUTH 3 (THREE) TIMES DAILY. 90 capsule 1   . vitamin B-12 (CYANOCOBALAMIN) 50 MCG tablet Take 1 tablet (50 mcg total) by mouth daily. 30 tablet 6   . Vitamin D, Ergocalciferol, (DRISDOL) 1.25 MG (50000 UT) CAPS capsule Take 1 capsule (50,000 Units total) by mouth every 7 (seven) days. 5 capsule 6   . voxelotor (OXBRYTA) 500 MG TABS tablet Take 1,500 mg by mouth daily. For Sickle Cell Anemia       Allergies: Patient has no known allergies. Past Medical History:  Diagnosis Date  . Nausea   . Sickle cell anemia (HCC)   . Vitamin B12 deficiency 12/2018  . Vitamin D deficiency    No past surgical  history on file. Family History  Problem Relation Age of Onset  . Hypertension Mother   . Sickle cell trait Mother   . Diabetes Father   . Sickle cell trait Father    Social History   Socioeconomic History  . Marital status: Single    Spouse name: Not on file  . Number of children: Not on file  . Years of education: Not on file  . Highest education level: Not on file  Occupational History  . Not on file  Tobacco Use  . Smoking status: Never Smoker  . Smokeless tobacco: Never Used  Substance and Sexual Activity  . Alcohol use: Not Currently  . Drug use: Never  . Sexual  activity: Yes    Birth control/protection: None  Other Topics Concern  . Not on file  Social History Narrative  . Not on file   Social Determinants of Health   Financial Resource Strain:   . Difficulty of Paying Living Expenses:   Food Insecurity:   . Worried About Charity fundraiser in the Last Year:   . Arboriculturist in the Last Year:   Transportation Needs:   . Film/video editor (Medical):   Marland Kitchen Lack of Transportation (Non-Medical):   Physical Activity:   . Days of Exercise per Week:   . Minutes of Exercise per Session:   Stress:   . Feeling of Stress :   Social Connections:   . Frequency of Communication with Friends and Family:   . Frequency of Social Gatherings with Friends and Family:   . Attends Religious Services:   . Active Member of Clubs or Organizations:   . Attends Archivist Meetings:   Marland Kitchen Marital Status:   Intimate Partner Violence:   . Fear of Current or Ex-Partner:   . Emotionally Abused:   Marland Kitchen Physically Abused:   . Sexually Abused:    Review of Systems  Constitutional: Negative.   HENT: Negative.   Eyes: Negative.   Respiratory: Negative.        Pain with deep breathing  Cardiovascular: Negative.   Genitourinary: Positive for flank pain.  Musculoskeletal: Positive for back pain and joint pain.  Skin: Negative for rash.  Neurological: Negative.  Negative for weakness.  Psychiatric/Behavioral: Negative.     Physical Exam: Pulse (!) 104, resp. rate 18, SpO2 96 %. Physical Exam Constitutional:      Appearance: Normal appearance. She is obese.  Eyes:     Pupils: Pupils are equal, round, and reactive to light.  Cardiovascular:     Rate and Rhythm: Normal rate and regular rhythm.     Pulses: Normal pulses.  Pulmonary:     Effort: Pulmonary effort is normal.     Breath sounds: Normal breath sounds.  Abdominal:     General: Abdomen is flat. Bowel sounds are normal.  Musculoskeletal:        General: Normal range of motion.   Skin:    General: Skin is warm.  Neurological:     General: No focal deficit present.     Mental Status: She is alert. Mental status is at baseline.  Psychiatric:        Mood and Affect: Mood normal.        Behavior: Behavior normal.        Thought Content: Thought content normal.        Judgment: Judgment normal.     Lab results: No results found for this or any previous visit (  from the past 24 hour(s)).  Imaging results:  No results found.   Assessment & Plan: Sickle cell disease type [place] with pain crisis:  Patient admitted to sickle cell day infusion center for management of pain crisis.  Patient is opiate tolerant Initiate IV dilaudid PCA. Settings of 0.6 mg, 10 minute lockout, and 4 mg per hour IV fluids, 0.45% saline at 100 ml/hr Toradol 15 mg IV times one dose Tylenol 1000 mg by mouth times one dose Review CBC with differential, complete metabolic panel, and reticulocytes as results become available.  Pain intensity will be reevaluated in context of functioning and relationship to baseline as care progress If pain intensity remains elevated and/or sudden change in hemodynamic stability transition to inpatient services for higher level of care.   Pleuritic type chest pain:  Patient endorses left side pain with deep breathing. Lungs clear to auscultation.  Chest xray and EKG       Nolon Nations  APRN, MSN, FNP-C Patient Care [facility] [address] [place], [place] [address] [phone]  04/18/2019, 9:57 AM

## 2019-04-18 NOTE — Progress Notes (Signed)
Patient admitted to the day hospital for sickle cell pain. Initially, patient reported bilateral leg and flank pain rated 10/10. For pain management, patient placed on Dilaudid PCA, given 15 mg Toradol, 1000 mg Tylenol and hydrated with IV fluids. Chest x-ray and EKG done due to patient's complaint of left side pain with breathing. Patient's pain remained elevated at 8/10. [place], FNP notified. Orders placed for admission. Report called to [place] on [place]. Patient transferred to [address] [place] in wheelchair. Alert, oriented and stable at transfer.

## 2019-04-18 NOTE — Telephone Encounter (Signed)
Patient called, complained of pain in the legs and "sides"  rated at 10/10. Denied chest pain, fever, diarrhea, abdominal pain, nausea/vomitting. Screened negative for Covid-19 symptoms. Admitted to having means of transportation without driving self after treatment. Last took 20 mg of Oxycodone at 04:00 am today. Per provider, patient can come to the day hospital for treatment. Patient notified, verbalized understanding.

## 2019-04-18 NOTE — BH Specialist Note (Signed)
Integrated Behavioral Health Referral Note  Met with patient briefly at bedside in the day hospital. Provided rief support around coping with recent and ongoing pain crisis.   IBH appointment scheduled with patient 04/27/19. Will hold full session with patient then as scheduled.   Estanislado Emms, [facility] [phone]

## 2019-04-19 ENCOUNTER — Inpatient Hospital Stay (HOSPITAL_COMMUNITY): Payer: Medicaid Other

## 2019-04-19 DIAGNOSIS — G894 Chronic pain syndrome: Secondary | ICD-10-CM

## 2019-04-19 LAB — SARS CORONAVIRUS 2 (TAT 6-24 HRS): SARS Coronavirus 2: NEGATIVE

## 2019-04-19 MED ORDER — IOHEXOL 350 MG/ML SOLN
100.0000 mL | Freq: Once | INTRAVENOUS | Status: AC | PRN
  Administered 2019-04-19: 100 mL via INTRAVENOUS

## 2019-04-19 MED ORDER — SODIUM CHLORIDE 0.9% FLUSH: 10.0000 mL | INTRAVENOUS | Status: DC | PRN

## 2019-04-19 MED ORDER — OXYCODONE HCL 5 MG PO TABS
10.0000 mg | ORAL_TABLET | ORAL | Status: DC | PRN
  Administered 2019-04-19 – 2019-04-20 (×3): 10 mg via ORAL
  Filled 2019-04-19 (×3): qty 2

## 2019-04-19 MED ORDER — SODIUM CHLORIDE 0.9% FLUSH
10.0000 mL | Freq: Two times a day (BID) | INTRAVENOUS | Status: DC
  Administered 2019-04-21 (×2): 10 mL

## 2019-04-19 MED ORDER — HYDROMORPHONE 1 MG/ML IV SOLN
INTRAVENOUS | Status: DC
  Administered 2019-04-19: 30 mg via INTRAVENOUS
  Administered 2019-04-19: 6.6 mg via INTRAVENOUS
  Administered 2019-04-19: 3 mg via INTRAVENOUS
  Administered 2019-04-20: 6.6 mg via INTRAVENOUS
  Filled 2019-04-19: qty 30

## 2019-04-19 NOTE — Progress Notes (Signed)
Subjective: Stephanie Burch, a 39 year old female with a medical history significant for sickle cell disease, chronic pain syndrome, opiate dependence and tolerance, and history of morbid obesity was admitted for sickle cell pain crisis. Patient says that pain continues to persist despite IV Dilaudid PCA.  She also continues to endorse left-sided chest pain with deep breathing.  Current pain intensity is 8/10.  Patient appears to be uncomfortable. She denies headache, shortness of breath, fever, chills, urinary symptoms, nausea, vomiting, or diarrhea.  Objective:  Vital signs in last 24 hours:  Vitals:   04/19/19 1347 04/19/19 1750 04/19/19 1955 04/19/19 2035  BP: 102/65   (!) 138/93  Pulse: (!) 104   84  Resp: 16 16 14 16   Temp: 98.6 F (37 C)   97.9 F (36.6 C)  TempSrc:    Oral  SpO2: 99% 97% 100% 100%  Weight:      Height:        Intake/Output from previous day:   Intake/Output Summary (Last 24 hours) at 04/19/2019 2105 Last data filed at 04/19/2019 0054 Gross per 24 hour  Intake 240 ml  Output --  Net 240 ml    Physical Exam: General: Alert, awake, oriented x3, in no acute distress.  Morbid obesity HEENT: [place]/AT PEERL, EOMI Neck: Trachea midline,  no masses, no thyromegal,y no JVD, no carotid bruit OROPHARYNX:  Moist, No exudate/ erythema/lesions.  Heart: Regular rate and rhythm, without murmurs, rubs, gallops, PMI non-displaced, no heaves or thrills on palpation.  Lungs: Clear to auscultation, no wheezing or rhonchi noted. No increased vocal fremitus resonant to percussion.  Decreased breath sounds in lower lung. Abdomen: Soft, nontender, nondistended, positive bowel sounds, no masses no hepatosplenomegaly noted..  Neuro: No focal neurological deficits noted cranial nerves II through XII grossly intact. DTRs 2+ bilaterally upper and lower extremities. Strength 5 out of 5 in bilateral upper and lower extremities. Musculoskeletal: No warm swelling or erythema around joints,  no spinal tenderness noted. Psychiatric: Patient alert and oriented x3, good insight and cognition, good recent to remote recall. Lymph node survey: No cervical axillary or inguinal lymphadenopathy noted.  Lab Results:  Basic Metabolic Panel:    Component Value Date/Time   NA 144 04/18/2019 0956   NA 142 09/11/2017 1431   K 3.7 04/18/2019 0956   CL 105 04/18/2019 0956   CO2 29 04/18/2019 0956   BUN 8 04/18/2019 0956   BUN 8 09/11/2017 1431   CREATININE 0.74 04/18/2019 0956   GLUCOSE 106 (H) 04/18/2019 0956   CALCIUM 9.0 04/18/2019 0956   CBC:    Component Value Date/Time   WBC 13.2 (H) 04/18/2019 0956   HGB 9.8 (L) 04/18/2019 0956   HGB 13.0 09/11/2017 1431   HCT 30.3 (L) 04/18/2019 0956   HCT 38.4 09/11/2017 1431   PLT 265 04/18/2019 0956   PLT 258 09/11/2017 1431   MCV 72.5 (L) 04/18/2019 0956   MCV 75 (L) 09/11/2017 1431   NEUTROABS 9.9 (H) 04/18/2019 0956   NEUTROABS 3.9 09/11/2017 1431   LYMPHSABS 1.9 04/18/2019 0956   LYMPHSABS 2.2 09/11/2017 1431   MONOABS 1.1 (H) 04/18/2019 0956   EOSABS 0.2 04/18/2019 0956   EOSABS 0.1 09/11/2017 1431   BASOSABS 0.0 04/18/2019 0956   BASOSABS 0.0 09/11/2017 1431    Recent Results (from the past 240 hour(s))  SARS CORONAVIRUS 2 (TAT 6-24 HRS) Nasopharyngeal Nasopharyngeal Swab     Status: None   Collection Time: 04/18/19  6:30 PM   Specimen: Nasopharyngeal Swab  Result Value Ref Range Status   SARS Coronavirus 2 NEGATIVE NEGATIVE Final    Comment: (NOTE) SARS-CoV-2 target nucleic acids are NOT DETECTED. The SARS-CoV-2 RNA is generally detectable in upper and lower respiratory specimens during the acute phase of infection. Negative results do not preclude SARS-CoV-2 infection, do not rule out co-infections with other pathogens, and should not be used as the sole basis for treatment or other patient management decisions. Negative results must be combined with clinical observations, patient history, and epidemiological  information. The expected result is Negative. Fact Sheet for Patients: HairSlick.no Fact Sheet for Healthcare Providers: quierodirigir.com This test is not yet approved or cleared by the [place] FDA and  has been authorized for detection and/or diagnosis of SARS-CoV-2 by FDA under an Emergency Use Authorization (EUA). This EUA will remain  in effect (meaning this test can be used) for the duration of the COVID-19 declaration under Section 56 4(b)(1) of the Act, 21 U.S.C. section 360bbb-3(b)(1), unless the authorization is terminated or revoked sooner. Performed at [facility], [address], [place], [place] [address]     Studies/Results: DG Chest 2 View  Result Date: 04/18/2019 CLINICAL DATA:  Chest pain EXAM: CHEST - 2 VIEW COMPARISON:  November 10, 2018 FINDINGS: Lungs are clear. Heart is upper normal in size with pulmonary vascularity normal. No adenopathy. No pneumothorax. No bone lesions. IMPRESSION: Lungs clear.  Heart upper normal in size.  No evident adenopathy. Electronically Signed   By: Bretta Bang III M.D.   On: 04/18/2019 15:12   CT ANGIO CHEST PE W OR WO CONTRAST  Result Date: 04/19/2019 CLINICAL DATA:  Shortness of breath. EXAM: CT ANGIOGRAPHY CHEST WITH CONTRAST TECHNIQUE: Multidetector CT imaging of the chest was performed using the standard protocol during bolus administration of intravenous contrast. Multiplanar CT image reconstructions and MIPs were obtained to evaluate the vascular anatomy. CONTRAST:  OMNIPAQUE IOHEXOL 350 MG/ML SOLN COMPARISON:  None. FINDINGS: Cardiovascular: Evaluation is limited by respiratory motion artifact.Given this limitation, no acute pulmonary embolism was detected. Detection of smaller pulmonary emboli is limited. The main pulmonary artery is within normal limits for size. There is no CT evidence of acute right heart strain. The visualized aorta is normal. Heart  size is mildly enlarged. There is no significant pericardial effusion. Mediastinum/Nodes: --No mediastinal or hilar lymphadenopathy. --No axillary lymphadenopathy. --No supraclavicular lymphadenopathy. --Normal thyroid gland. --The esophagus is unremarkable Lungs/Pleura: There is a 6 mm pulmonary nodule in the right middle lobe (axial series 7, image 58). There is no pneumothorax. No large pleural effusion. No focal infiltrate. There is some atelectasis at the lung bases, left worse than right. Upper Abdomen: There appears to be some free fluid about the spleen in the left upper quadrant. The spleen is somewhat heterogeneous in appearance with questionable wedge-shaped hypoattenuating defects. 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 There is no acute displaced fracture. Review of the MIP images confirms the above findings. IMPRESSION: 1. Evaluation is limited by respiratory motion artifact. Given this limitation, no acute pulmonary embolism was detected. 2. A 6 mm pulmonary nodule in the right middle lobe. If the patient is low risk for lung cancer, no further follow up is required. If the patient is high risk for lung cancer, an optional CT of the chest can be performed in 12 months. 3. 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 4. There appears to be some free fluid about the spleen in the left upper quadrant. The spleen  is somewhat heterogeneous in appearance with questionable wedge-shaped hypoattenuating defects. Findings could represent developing splenic infarcts in the setting of sickle cell disease. Electronically Signed   By: Constance Holster M.D.   On: 04/19/2019 19:11    Medications: Scheduled Meds: . DULoxetine  20 mg Oral Daily  . enoxaparin (LOVENOX) injection  40 mg Subcutaneous Q24H  . folic acid  1 mg Oral Daily  . HYDROmorphone   Intravenous Q4H  . hydroxyurea  2,000 mg Oral Daily  . ketorolac  15 mg Intravenous Q6H  . senna-docusate  1  tablet Oral BID  . sodium chloride flush  10-40 mL Intracatheter Q12H  . tiZANidine  4 mg Oral TID  . voxelotor  1,500 mg Oral Daily   Continuous Infusions: . sodium chloride 10 mL/hr at 04/19/19 1251   PRN Meds:.diphenhydrAMINE, naloxone **AND** sodium chloride flush, ondansetron (ZOFRAN) IV, oxyCODONE, polyethylene glycol, sodium chloride flush  Consultants:  None  Procedures:  None  Antibiotics:  None  Assessment/Plan: Principal Problem:   Sickle cell pain crisis ([place]) Active Problems:   Leukocytosis   Morbidly obese (HCC)   Chronic pain syndrome   Chest pain varying with breathing  Sickle cell disease with pain crisis: Continue IV fluids at [facility] IV Dilaudid PCA, settings changed to 0.5 mg, 10-minute lockout, and 3 mg/h Oxycodone 20 mg every 4 hours as needed for severe breakthrough pain Toradol 15 mg IV every 6 hours.  Supplemental oxygen as needed.  Monitor vital signs closely.  Reevaluate pain scale regularly.  Sickle cell anemia: Hemoglobin stable.  Consistent with patient's baseline.  No clinical indication for blood transfusion on today.  Repeat CBC in a.m.  Leukocytosis: Mild leukocytosis.  Considered to be reactive.  Patient afebrile without signs of infection or inflammation.  Continue to follow closely.  Chronic pain syndrome: Continue home medications  Morbid obesity: Patient counseled at length.  Heart healthy diet recommended.  History of depression: Continue home medications.  No SI or HI  Left side pain very and with deep breathing: Chest x-ray done yesterday showed no acute cardiopulmonary process.  Review CT of chest to rule out PE as results become available. Continue to follow closely   Code Status: Full Code Family Communication: N/A Disposition Plan: Not yet ready for discharge  [place], MSN, FNP-C Patient [place], [place] [address] [phone]  If  5PM-8AM, please contact night-coverage.  04/19/2019, 9:05 PM  LOS: 1 day

## 2019-04-20 ENCOUNTER — Telehealth: Payer: Self-pay | Admitting: Family Medicine

## 2019-04-20 DIAGNOSIS — R350 Frequency of micturition: Secondary | ICD-10-CM

## 2019-04-20 DIAGNOSIS — N898 Other specified noninflammatory disorders of vagina: Secondary | ICD-10-CM

## 2019-04-20 LAB — CBC
HCT: 26.6 % — ABNORMAL LOW (ref 36.0–46.0)
Hemoglobin: 8.8 g/dL — ABNORMAL LOW (ref 12.0–15.0)
MCH: 24.2 pg — ABNORMAL LOW (ref 26.0–34.0)
MCHC: 33.1 g/dL (ref 30.0–36.0)
MCV: 73.1 fL — ABNORMAL LOW (ref 80.0–100.0)
Platelets: 231 10*3/uL (ref 150–400)
RBC: 3.64 MIL/uL — ABNORMAL LOW (ref 3.87–5.11)
RDW: 16.2 % — ABNORMAL HIGH (ref 11.5–15.5)
WBC: 8.4 10*3/uL (ref 4.0–10.5)
nRBC: 2.7 % — ABNORMAL HIGH (ref 0.0–0.2)

## 2019-04-20 LAB — URINALYSIS, COMPLETE (UACMP) WITH MICROSCOPIC
Bacteria, UA: NONE SEEN
Bilirubin Urine: NEGATIVE
Glucose, UA: NEGATIVE mg/dL
Hgb urine dipstick: NEGATIVE
Ketones, ur: NEGATIVE mg/dL
Leukocytes,Ua: NEGATIVE
Nitrite: NEGATIVE
Protein, ur: NEGATIVE mg/dL
Specific Gravity, Urine: 1.014 (ref 1.005–1.030)
pH: 6 (ref 5.0–8.0)

## 2019-04-20 LAB — WET PREP, GENITAL
Clue Cells Wet Prep HPF POC: NONE SEEN
Sperm: NONE SEEN
Trich, Wet Prep: NONE SEEN

## 2019-04-20 LAB — BASIC METABOLIC PANEL
Anion gap: 6 (ref 5–15)
BUN: 11 mg/dL (ref 6–20)
CO2: 28 mmol/L (ref 22–32)
Calcium: 8.5 mg/dL — ABNORMAL LOW (ref 8.9–10.3)
Chloride: 107 mmol/L (ref 98–111)
Creatinine, Ser: 0.66 mg/dL (ref 0.44–1.00)
GFR calc Af Amer: >60 mL/min (ref >60)
GFR calc non Af Amer: >60 mL/min (ref >60)
Glucose, Bld: 99 mg/dL (ref 70–99)
Potassium: 3.6 mmol/L (ref 3.5–5.1)
Sodium: 141 mmol/L (ref 135–145)

## 2019-04-20 MED ORDER — OXYCODONE HCL ER 15 MG PO T12A
15.0000 mg | EXTENDED_RELEASE_TABLET | Freq: Two times a day (BID) | ORAL | Status: DC
  Administered 2019-04-20 – 2019-04-22 (×5): 15 mg via ORAL
  Filled 2019-04-20 (×5): qty 1

## 2019-04-20 MED ORDER — OXYCODONE HCL 5 MG PO TABS
10.0000 mg | ORAL_TABLET | ORAL | Status: DC
  Administered 2019-04-20 – 2019-04-22 (×11): 10 mg via ORAL
  Filled 2019-04-20 (×12): qty 2

## 2019-04-20 MED ORDER — HYDROMORPHONE 1 MG/ML IV SOLN
INTRAVENOUS | Status: DC
  Administered 2019-04-20: 4.8 mg via INTRAVENOUS
  Administered 2019-04-20: 8.4 mg via INTRAVENOUS
  Administered 2019-04-20: 30 mg via INTRAVENOUS
  Administered 2019-04-21: 8.8 mg via INTRAVENOUS
  Filled 2019-04-20: qty 30

## 2019-04-20 NOTE — Plan of Care (Signed)

## 2019-04-20 NOTE — TOC Initial Note (Signed)
Transition of Care [facility]) - Initial/Assessment Note    Patient Details  Name: Stephanie Burch MRN: [id number] Date of Birth: 1980/03/06  [facility]) CM/SW Contact:    Lennart Pall, LCSW Phone Number: 04/20/2019, 1:31 PM  Clinical Narrative:   Pt very pleasant and open to this SW review of community resource connections she has.  She lives with her 3 children, ages 59, 4 AND 53 yrs who are currently staying with their father.  She has good support from her own family and friends and denies any significant concerns about d/c.  Agreeable to this SW following along for any potential d/c needs.                Expected Discharge Plan: Home/Self Care Barriers to Discharge: No Barriers Identified   Patient Goals and CMS Choice Patient states their goals for this hospitalization and ongoing recovery are:: Primary goal for pain management   Choice offered to / list presented to : NA  Expected Discharge Plan and Services Expected Discharge Plan: Home/Self Care In-house Referral: Clinical Social Work   Post Acute Care Choice: NA Living arrangements for the past 2 months: Single Family Home                                      Prior Living Arrangements/Services Living arrangements for the past 2 months: Single Family Home Lives with:: Minor Children   Do you feel safe going back to the place where you live?: Yes      Need for Family Participation in Patient Care: No (Comment) Care giver support system in place?: Yes (comment)   Criminal Activity/Legal Involvement Pertinent to Current Situation/Hospitalization: No - Comment as needed  Activities of Daily Living Home Assistive Devices/Equipment: None ADL Screening (condition at time of admission) Patient's cognitive ability adequate to safely complete daily activities?: Yes Is the patient deaf or have difficulty hearing?: No Does the patient have difficulty seeing, even when wearing glasses/contacts?: No Does the  patient have difficulty concentrating, remembering, or making decisions?: No Patient able to express need for assistance with ADLs?: Yes Does the patient have difficulty dressing or bathing?: No Independently performs ADLs?: Yes (appropriate for developmental age) Does the patient have difficulty walking or climbing stairs?: No Weakness of Legs: None Weakness of Arms/Hands: None  Permission Sought/Granted Permission sought to share information with : Family Supports    Share Information with NAME: [place] granted to share info w Relationship: friend  Permission granted to share info w Contact Information: [phone]  Emotional Assessment Appearance:: Appears stated age Attitude/Demeanor/Rapport: Engaged, Gracious Affect (typically observed): Stable, Accepting Orientation: : Oriented to Self, Oriented to Place, Oriented to  Time, Oriented to Situation   Psych Involvement: No (comment)  Admission diagnosis:  Sickle cell pain crisis [facility]) [D57.00] Patient Active Problem List   Diagnosis Date Noted  . Chest pain varying with breathing   . Fever and chills   . Anxiety 11/15/2018  . Vitamin D deficiency 11/15/2018  . Nausea 11/15/2018  . Abnormal pulse oximetry   . Hypokalemia 09/20/2018  . Medication management 09/14/2018  . Muscle spasms of both lower extremities 08/25/2018  . Subjective visual disturbance of right eye 08/25/2018  . Hb-SS disease without crisis ([place]) 04/13/2018  . Chronic pain syndrome 03/16/2018  . Chronic, continuous use of opioids 03/16/2018  . Tachycardia with heart rate 100-120 beats  per minute   . Sickle cell anemia with crisis (HCC) 10/19/2017  . Leukocytosis 10/19/2017  . Thrombocytopenia (HCC) 10/19/2017  . Morbidly obese (HCC) 10/19/2017  . Sickle cell pain crisis (HCC) 10/15/2017   PCP:  Kallie Locks, FNP Pharmacy:   [facility] [phone] - [place], [place] - [address] [place]  [address] Phone: [phone] Fax: [phone]     Social Determinants of Health (SDOH) Interventions    Readmission Risk Interventions Readmission Risk Prevention Plan 04/20/2019 12/23/2018  Transportation Screening Complete Complete  PCP or Specialist Appt within 3-5 Days Complete Complete  HRI or Home Care Consult Not Complete Not Complete  HRI or Home Care Consult comments NA NA  Social Work Consult for Recovery Care Planning/Counseling Complete Complete  Palliative Care Screening Not Applicable Not Applicable  Medication Review Oceanographer) Complete Complete

## 2019-04-20 NOTE — Progress Notes (Signed)
Subjective: Stephanie Burch, a 39 year old female with a medical history significant for sickle cell disease, chronic pain syndrome, opiate dependence and tolerance, and history of morbid obesity was admitted for sickle cell pain crisis.   Patient says that pain has improved some overnight. Pain intensity is 7/10 primarily to lower extremities. Patient has been able to get out of bed without assistance. She also continues to endorse left sided chest pain varying with inspiration. Patient underwent chest CT on yesterday that ruled out PE or acute cardiopulmonary process.   Patient is complaining of vaginal odor and urinary frequency. She denies dysuria, urgency, or incontinence of urine and stool. She is afebrile. Patient also denies shortness of breath, dizziness, paresthesias, nausea, vomiting, or diarrhea.   Objective:  Vital signs in last 24 hours:  Vitals:   04/20/19 0800 04/20/19 0826 04/20/19 1228 04/20/19 1251  BP:  129/71  120/63  Pulse:  86  86  Resp: 15 17 16 15   Temp:  98.7 F (37.1 C)  (!) 97.4 F (36.3 C)  TempSrc:  Oral  Oral  SpO2: (!) 43% 100% 97% 100%  Weight:      Height:        Intake/Output from previous day:   Intake/Output Summary (Last 24 hours) at 04/20/2019 1350 Last data filed at 04/20/2019 0600 Gross per 24 hour  Intake 2423.22 ml  Output 0 ml  Net 2423.22 ml    Physical Exam: General: Alert, awake, oriented x3, in no acute distress. Morbid obesity HEENT: [place]/AT PEERL, EOMI Neck: Trachea midline,  no masses, no thyromegal,y no JVD, no carotid bruit OROPHARYNX:  Moist, No exudate/ erythema/lesions.  Heart: Regular rate and rhythm, without murmurs, rubs, gallops, PMI non-displaced, no heaves or thrills on palpation.  Lungs: Clear to auscultation, no wheezing or rhonchi noted. No increased vocal fremitus resonant to percussion  Abdomen: Soft, nontender, nondistended, positive bowel sounds, no masses no hepatosplenomegaly noted..  Neuro: No focal  neurological deficits noted cranial nerves II through XII grossly intact. DTRs 2+ bilaterally upper and lower extremities. Strength 5 out of 5 in bilateral upper and lower extremities. Musculoskeletal: No warm swelling or erythema around joints, no spinal tenderness noted. Psychiatric: Patient alert and oriented x3, good insight and cognition, good recent to remote recall. Lymph node survey: No cervical axillary or inguinal lymphadenopathy noted.  Lab Results:  Basic Metabolic Panel:    Component Value Date/Time   NA 141 04/20/2019 0545   NA 142 09/11/2017 1431   K 3.6 04/20/2019 0545   CL 107 04/20/2019 0545   CO2 28 04/20/2019 0545   BUN 11 04/20/2019 0545   BUN 8 09/11/2017 1431   CREATININE 0.66 04/20/2019 0545   GLUCOSE 99 04/20/2019 0545   CALCIUM 8.5 (L) 04/20/2019 0545   CBC:    Component Value Date/Time   WBC 8.4 04/20/2019 0545   HGB 8.8 (L) 04/20/2019 0545   HGB 13.0 09/11/2017 1431   HCT 26.6 (L) 04/20/2019 0545   HCT 38.4 09/11/2017 1431   PLT 231 04/20/2019 0545   PLT 258 09/11/2017 1431   MCV 73.1 (L) 04/20/2019 0545   MCV 75 (L) 09/11/2017 1431   NEUTROABS 9.9 (H) 04/18/2019 0956   NEUTROABS 3.9 09/11/2017 1431   LYMPHSABS 1.9 04/18/2019 0956   LYMPHSABS 2.2 09/11/2017 1431   MONOABS 1.1 (H) 04/18/2019 0956   EOSABS 0.2 04/18/2019 0956   EOSABS 0.1 09/11/2017 1431   BASOSABS 0.0 04/18/2019 0956   BASOSABS 0.0 09/11/2017 1431  Recent Results (from the past 240 hour(s))  SARS CORONAVIRUS 2 (TAT 6-24 HRS) Nasopharyngeal Nasopharyngeal Swab     Status: None   Collection Time: 04/18/19  6:30 PM   Specimen: Nasopharyngeal Swab  Result Value Ref Range Status   SARS Coronavirus 2 NEGATIVE NEGATIVE Final    Comment: (NOTE) SARS-CoV-2 target nucleic acids are NOT DETECTED. The SARS-CoV-2 RNA is generally detectable in upper and lower respiratory specimens during the acute phase of infection. Negative results do not preclude SARS-CoV-2 infection, do not  rule out co-infections with other pathogens, and should not be used as the sole basis for treatment or other patient management decisions. Negative results must be combined with clinical observations, patient history, and epidemiological information. The expected result is Negative. Fact Sheet for Patients: HairSlick.no Fact Sheet for Healthcare Providers: quierodirigir.com This test is not yet approved or cleared by the [place] FDA and  has been authorized for detection and/or diagnosis of SARS-CoV-2 by FDA under an Emergency Use Authorization (EUA). This EUA will remain  in effect (meaning this test can be used) for the duration of the COVID-19 declaration under Section 56 4(b)(1) of the Act, 21 U.S.C. section 360bbb-3(b)(1), unless the authorization is terminated or revoked sooner. Performed at [facility], [address] [place], [place] [address]     Studies/Results: DG Chest 2 View  Result Date: 04/18/2019 CLINICAL DATA:  Chest pain EXAM: CHEST - 2 VIEW COMPARISON:  November 10, 2018 FINDINGS: Lungs are clear. Heart is upper normal in size with pulmonary vascularity normal. No adenopathy. No pneumothorax. No bone lesions. IMPRESSION: Lungs clear.  Heart upper normal in size.  No evident adenopathy. Electronically Signed   By: Bretta Bang III M.D.   On: 04/18/2019 15:12   CT ANGIO CHEST PE W OR WO CONTRAST  Result Date: 04/19/2019 CLINICAL DATA:  Shortness of breath. EXAM: CT ANGIOGRAPHY CHEST WITH CONTRAST TECHNIQUE: Multidetector CT imaging of the chest was performed using the standard protocol during bolus administration of intravenous contrast. Multiplanar CT image reconstructions and MIPs were obtained to evaluate the vascular anatomy. CONTRAST:  OMNIPAQUE IOHEXOL 350 MG/ML SOLN COMPARISON:  None. FINDINGS: Cardiovascular: Evaluation is limited by respiratory motion artifact.Given this limitation, no  acute pulmonary embolism was detected. Detection of smaller pulmonary emboli is limited. The main pulmonary artery is within normal limits for size. There is no CT evidence of acute right heart strain. The visualized aorta is normal. Heart size is mildly enlarged. There is no significant pericardial effusion. Mediastinum/Nodes: --No mediastinal or hilar lymphadenopathy. --No axillary lymphadenopathy. --No supraclavicular lymphadenopathy. --Normal thyroid gland. --The esophagus is unremarkable Lungs/Pleura: There is a 6 mm pulmonary nodule in the right middle lobe (axial series 7, image 58). There is no pneumothorax. No large pleural effusion. No focal infiltrate. There is some atelectasis at the lung bases, left worse than right. Upper Abdomen: There appears to be some free fluid about the spleen in the left upper quadrant. The spleen is somewhat heterogeneous in appearance with questionable wedge-shaped hypoattenuating defects. 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 There is no acute displaced fracture. Review of the MIP images confirms the above findings. IMPRESSION: 1. Evaluation is limited by respiratory motion artifact. Given this limitation, no acute pulmonary embolism was detected. 2. A 6 mm pulmonary nodule in the right middle lobe. If the patient is low risk for lung cancer, no further follow up is required. If the patient is high risk for lung cancer, an  optional CT of the chest can be performed in 12 months. 3. 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 4. There appears to be some free fluid about the spleen in the left upper quadrant. The spleen is somewhat heterogeneous in appearance with questionable wedge-shaped hypoattenuating defects. Findings could represent developing splenic infarcts in the setting of sickle cell disease. Electronically Signed   By: Katherine Mantle M.D.   On: 04/19/2019 19:11    Medications: Scheduled Meds: . DULoxetine  20 mg  Oral Daily  . enoxaparin (LOVENOX) injection  40 mg Subcutaneous Q24H  . folic acid  1 mg Oral Daily  . HYDROmorphone   Intravenous Q4H  . hydroxyurea  2,000 mg Oral Daily  . ketorolac  15 mg Intravenous Q6H  . oxyCODONE  10 mg Oral Q4H while awake  . oxyCODONE  15 mg Oral Q12H  . senna-docusate  1 tablet Oral BID  . sodium chloride flush  10-40 mL Intracatheter Q12H  . tiZANidine  4 mg Oral TID  . voxelotor  1,500 mg Oral Daily   Continuous Infusions: . sodium chloride 10 mL/hr at 04/19/19 2344   PRN Meds:.diphenhydrAMINE, naloxone **AND** sodium chloride flush, ondansetron (ZOFRAN) IV, polyethylene glycol, sodium chloride flush  Consultants:  None  Procedures:  None  Antibiotics:  None  Assessment/Plan: Principal Problem:   Sickle cell pain crisis (HCC) Active Problems:   Leukocytosis   Morbidly obese (HCC)   Chronic pain syndrome   Chest pain varying with breathing  Sickle cell disease with pain crisis: Decrease IV Dilaudid PCA.  Settings changed to 0.5 mg, 10-minute lockout, and 1.5 mg/h. Oxycodone 20 mg every 4 hours while awake for severe pain. Toradol 15 mg IV every 6 hours Supplemental oxygen as needed.  Monitor vital signs closely.  Reevaluate pain scale regularly.  Sickle cell anemia: Hemoglobin stable.  Consistent with patient's baseline.  No clinical indication for blood transfusion on today.  Continue to follow CBC.  Leukocytosis: Resolved.  Patient continues to be afebrile without any signs of infection or inflammation.  Chronic pain syndrome: Continue home medications  Morbid obesity: Patient counseled at length.  Heart healthy diet recommended.  History of depression: Continue home medications.  Patient denies suicidal or homicidal intent.  Left side pain varying with deep breathing: Patient underwent CT of chest on yesterday.  Evaluation is limited by respiratory motion artifact.  No acute pulmonary embolism was detected.  A 6 mm pulmonary  nodule in the right middle lobe was visualized.  Patient is low risk for lung cancer.  No further follow-up warranted.  An optional CT of the chest could be performed for surveillance in 1 year. Avascular necrosis of bilateral humeral heads.  Also there appears to be some free fluid about the spleen and the left upper quadrant which could be developing splenic infarcts in the setting of sickle cell disease. Continue to monitor closely  Urinary frequency and vaginal odor: Vaginal wet prep Repeat urinalysis Previous urine culture showed less than 10,000 colonies  Code Status: Full Code Family Communication: N/A Disposition Plan: Not yet ready for discharge   Nolon Nations  APRN, MSN, FNP-C [facility] [address] [place], [place] [address] [phone]  If 5PM-8AM, please contact night-coverage.  04/20/2019, 1:50 PM  LOS: 2 days

## 2019-04-21 MED ORDER — FLUCONAZOLE 200 MG PO TABS
200.0000 mg | ORAL_TABLET | Freq: Once | ORAL | Status: AC
  Administered 2019-04-21: 200 mg via ORAL
  Filled 2019-04-21: qty 1

## 2019-04-21 MED ORDER — HYDROMORPHONE HCL 1 MG/ML IJ SOLN
2.0000 mg | Freq: Four times a day (QID) | INTRAMUSCULAR | Status: DC | PRN
  Administered 2019-04-21 – 2019-04-22 (×3): 2 mg via INTRAVENOUS
  Filled 2019-04-21 (×3): qty 2

## 2019-04-21 NOTE — Progress Notes (Signed)
Subjective: Stephanie Burch, a 39 year old female with a medical history significant for sickle cell disease, chronic pain syndrome, opiate dependence and tolerance, and morbid obesity was admitted for sickle cell pain crisis.   Patient says that pain is continuing to improve. Pain intensity is 6/10. She says that she cannot manage pain at home. Goal is 5/10. She denies chest pain, shortness of breath, nausea, vomiting, or diarrhea.   Patient complaining of vaginal odor. Wet prep showed yeast and some bacteria. She denies vaginal bleeding, itching, or genital lesions.   Objective:  Vital signs in last 24 hours:  Vitals:   04/21/19 0350 04/21/19 0410 04/21/19 0842 04/21/19 1319  BP:  (!) 146/71  124/67  Pulse:  93  72  Resp: 18 18 20 20   Temp:  99 F (37.2 C)  98.7 F (37.1 C)  TempSrc:  Oral  Oral  SpO2: 99% 95% 99% 100%  Weight:      Height:        Intake/Output from previous day:   Intake/Output Summary (Last 24 hours) at 04/21/2019 1603 Last data filed at 04/21/2019 0300 Gross per 24 hour  Intake 447.57 ml  Output --  Net 447.57 ml    Physical Exam: General: Alert, awake, oriented x3, in no acute distress.  HEENT: [place]/AT PEERL, EOMI Neck: Trachea midline,  no masses, no thyromegal,y no JVD, no carotid bruit OROPHARYNX:  Moist, No exudate/ erythema/lesions.  Heart: Regular rate and rhythm, without murmurs, rubs, gallops, PMI non-displaced, no heaves or thrills on palpation.  Lungs: Clear to auscultation, no wheezing or rhonchi noted. No increased vocal fremitus resonant to percussion  Abdomen: Soft, nontender, nondistended, positive bowel sounds, no masses no hepatosplenomegaly noted..  Neuro: No focal neurological deficits noted cranial nerves II through XII grossly intact. DTRs 2+ bilaterally upper and lower extremities. Strength 5 out of 5 in bilateral upper and lower extremities. Musculoskeletal: No warm swelling or erythema around joints, no spinal tenderness  noted. Psychiatric: Patient alert and oriented x3, good insight and cognition, good recent to remote recall. Lymph node survey: No cervical axillary or inguinal lymphadenopathy noted.  Lab Results:  Basic Metabolic Panel:    Component Value Date/Time   NA 141 04/20/2019 0545   NA 142 09/11/2017 1431   K 3.6 04/20/2019 0545   CL 107 04/20/2019 0545   CO2 28 04/20/2019 0545   BUN 11 04/20/2019 0545   BUN 8 09/11/2017 1431   CREATININE 0.66 04/20/2019 0545   GLUCOSE 99 04/20/2019 0545   CALCIUM 8.5 (L) 04/20/2019 0545   CBC:    Component Value Date/Time   WBC 8.4 04/20/2019 0545   HGB 8.8 (L) 04/20/2019 0545   HGB 13.0 09/11/2017 1431   HCT 26.6 (L) 04/20/2019 0545   HCT 38.4 09/11/2017 1431   PLT 231 04/20/2019 0545   PLT 258 09/11/2017 1431   MCV 73.1 (L) 04/20/2019 0545   MCV 75 (L) 09/11/2017 1431   NEUTROABS 9.9 (H) 04/18/2019 0956   NEUTROABS 3.9 09/11/2017 1431   LYMPHSABS 1.9 04/18/2019 0956   LYMPHSABS 2.2 09/11/2017 1431   MONOABS 1.1 (H) 04/18/2019 0956   EOSABS 0.2 04/18/2019 0956   EOSABS 0.1 09/11/2017 1431   BASOSABS 0.0 04/18/2019 0956   BASOSABS 0.0 09/11/2017 1431    Recent Results (from the past 240 hour(s))  SARS CORONAVIRUS 2 (TAT 6-24 HRS) Nasopharyngeal Nasopharyngeal Swab     Status: None   Collection Time: 04/18/19  6:30 PM   Specimen: Nasopharyngeal Swab  Result Value Ref Range Status   SARS Coronavirus 2 NEGATIVE NEGATIVE Final    Comment: (NOTE) SARS-CoV-2 target nucleic acids are NOT DETECTED. The SARS-CoV-2 RNA is generally detectable in upper and lower respiratory specimens during the acute phase of infection. Negative results do not preclude SARS-CoV-2 infection, do not rule out co-infections with other pathogens, and should not be used as the sole basis for treatment or other patient management decisions. Negative results must be combined with clinical observations, patient history, and epidemiological information. The  expected result is Negative. Fact Sheet for Patients: SugarRoll.be Fact Sheet for Healthcare Providers: https://www.woods-mathews.com/ This test is not yet approved or cleared by the [place] FDA and  has been authorized for detection and/or diagnosis of SARS-CoV-2 by FDA under an Emergency Use Authorization (EUA). This EUA will remain  in effect (meaning this test can be used) for the duration of the COVID-19 declaration under Section 56 4(b)(1) of the Act, 21 U.S.C. section 360bbb-3(b)(1), unless the authorization is terminated or revoked sooner. Performed at [facility], [place] [address] [place], [place] [address]   Wet prep, genital     Status: Abnormal   Collection Time: 04/20/19  3:42 PM   Specimen: Vaginal  Result Value Ref Range Status   Yeast Wet Prep HPF POC PRESENT (A) NONE SEEN Final   Trich, Wet Prep NONE SEEN NONE SEEN Final   Clue Cells Wet Prep HPF POC NONE SEEN NONE SEEN Final   WBC, Wet Prep HPF POC FEW (A) NONE SEEN Final   Sperm NONE SEEN  Final    Comment: Performed at [facility], [place] [address] [place], [place] [address]    Studies/Results: CT ANGIO CHEST PE W OR WO CONTRAST  Result Date: 04/19/2019 CLINICAL DATA:  Shortness of breath. EXAM: CT ANGIOGRAPHY CHEST WITH CONTRAST TECHNIQUE: Multidetector CT imaging of the chest was performed using the standard protocol during bolus administration of intravenous contrast. Multiplanar CT image reconstructions and MIPs were obtained to evaluate the vascular anatomy. CONTRAST:  165mL OMNIPAQUE IOHEXOL 350 MG/ML SOLN COMPARISON:  None. FINDINGS: Cardiovascular: Evaluation is limited by respiratory motion artifact.Given this limitation, no acute pulmonary embolism was detected. Detection of smaller pulmonary emboli is limited. The main pulmonary artery is within normal limits for size. There is no CT evidence of acute right heart strain. The visualized  aorta is normal. Heart size is mildly enlarged. There is no significant pericardial effusion. Mediastinum/Nodes: --No mediastinal or hilar lymphadenopathy. --No axillary lymphadenopathy. --No supraclavicular lymphadenopathy. --Normal thyroid gland. --The esophagus is unremarkable Lungs/Pleura: There is a 6 mm pulmonary nodule in the right middle lobe (axial series 7, image 58). There is no pneumothorax. No large pleural effusion. No focal infiltrate. There is some atelectasis at the lung bases, left worse than right. Upper Abdomen: There appears to be some free fluid about the spleen in the left upper quadrant. The spleen is somewhat heterogeneous in appearance with questionable wedge-shaped hypoattenuating defects. 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 There is no acute displaced fracture. Review of the MIP images confirms the above findings. IMPRESSION: 1. Evaluation is limited by respiratory motion artifact. Given this limitation, no acute pulmonary embolism was detected. 2. A 6 mm pulmonary nodule in the right middle lobe. If the patient is low risk for lung cancer, no further follow up is required. If the patient is high risk for lung cancer, an optional CT of the chest can be performed in 12 months. 3.  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 4. There appears to be some free fluid about the spleen in the left upper quadrant. The spleen is somewhat heterogeneous in appearance with questionable wedge-shaped hypoattenuating defects. Findings could represent developing splenic infarcts in the setting of sickle cell disease. Electronically Signed   By: Katherine Mantle M.D.   On: 04/19/2019 19:11    Medications: Scheduled Meds: . DULoxetine  20 mg Oral Daily  . enoxaparin (LOVENOX) injection  40 mg Subcutaneous Q24H  . folic acid  1 mg Oral Daily  . hydroxyurea  2,000 mg Oral Daily  . ketorolac  15 mg Intravenous Q6H  . oxyCODONE  10 mg Oral Q4H while  awake  . oxyCODONE  15 mg Oral Q12H  . senna-docusate  1 tablet Oral BID  . sodium chloride flush  10-40 mL Intracatheter Q12H  . tiZANidine  4 mg Oral TID  . voxelotor  1,500 mg Oral Daily   Continuous Infusions: . sodium chloride 10 mL/hr at 04/21/19 0300   PRN Meds:.HYDROmorphone (DILAUDID) injection, polyethylene glycol, sodium chloride flush  Consultants:  None  Procedures:  None  Antibiotics:  None  Assessment/Plan: Principal Problem:   Sickle cell pain crisis (HCC) Active Problems:   Leukocytosis   Morbidly obese (HCC)   Chronic pain syndrome   Chest pain varying with breathing   Urinary frequency   Vaginal odor  Sickle cell disease with pain crisis: Discontinue IV Dilaudid PCA.  Oxycodone 20 mg every 4 hours while awake.  Dilaudid 2 mg IV every 6 hours as needed for severe breakthrough pain. Continue Toradol 15 mg IV every 6 hours Monitor vital signs closely and reevaluate pain scale regularly  Sickle cell anemia: Hemoglobin stable.  Consistent with patient's baseline.  No clinical location for blood transfusion on today.  Leukocytosis: Resolved.  Patient is afebrile.  Repeat CBC in a.m.  Chronic pain syndrome: Continue home medications  Morbid obesity: Patient counseled at length.  Heart healthy diet recommended.  History of depression: Continue home medications. Patient denies SI or HI.  Left side pain varying with deep breathing: Much improved.  CT showed no acute pulmonary process.  No pulmonary embolism.  Continue to follow closely.  Yeast infection: Wet prep on yesterday showed presence of yeast and some bacteria.  Diflucan 200 mg x 1 dose Urinalysis unremarkable  Code Status: Full Code Family Communication: N/A Disposition Plan: Not yet ready for discharge  Teddy Pena Rennis Petty  APRN, MSN, FNP-C [facility] [address] [place], [place] [address] [phone]  If 5PM-8AM, please contact  night-coverage.  04/21/2019, 4:03 PM  LOS: 3 days

## 2019-04-22 ENCOUNTER — Other Ambulatory Visit: Payer: Self-pay | Admitting: Family Medicine

## 2019-04-22 ENCOUNTER — Telehealth: Payer: Self-pay | Admitting: Family Medicine

## 2019-04-22 DIAGNOSIS — G894 Chronic pain syndrome: Secondary | ICD-10-CM

## 2019-04-22 DIAGNOSIS — F1190 Opioid use, unspecified, uncomplicated: Secondary | ICD-10-CM

## 2019-04-22 LAB — URINE CULTURE: Culture: <10000 — AB

## 2019-04-22 LAB — CBC
HCT: 28.5 % — ABNORMAL LOW (ref 36.0–46.0)
Hemoglobin: 9.7 g/dL — ABNORMAL LOW (ref 12.0–15.0)
MCH: 24.9 pg — ABNORMAL LOW (ref 26.0–34.0)
MCHC: 34 g/dL (ref 30.0–36.0)
MCV: 73.1 fL — ABNORMAL LOW (ref 80.0–100.0)
Platelets: 226 10*3/uL (ref 150–400)
RBC: 3.9 MIL/uL (ref 3.87–5.11)
RDW: 17.4 % — ABNORMAL HIGH (ref 11.5–15.5)
WBC: 6.9 10*3/uL (ref 4.0–10.5)
nRBC: 0.4 % — ABNORMAL HIGH (ref 0.0–0.2)

## 2019-04-22 MED ORDER — OXYCODONE HCL 20 MG PO TABS: 20.0000 mg | ORAL_TABLET | ORAL | 0 refills | Status: DC | PRN

## 2019-04-22 NOTE — Discharge Summary (Signed)
Physician Discharge Summary  Stephanie Burch FXT:[id number] DOB: 25-Apr-1980 DOA: 04/18/2019  PCP: Kallie Locks, FNP  Admit date: 04/18/2019  Discharge date: 04/22/2019  Discharge Diagnoses:  Principal Problem:   Sickle cell pain crisis (HCC) Active Problems:   Leukocytosis   Morbidly obese (HCC)   Chronic pain syndrome   Chest pain varying with breathing   Urinary frequency   Vaginal odor   Discharge Condition: Stable  Disposition:  Follow-up Information    Kallie Locks, FNP Follow up in 1 day(s).   Specialty: Family Medicine Why: Follow up as scheduled.  Contact information: [address] [place] [address] [phone]          Pt is discharged home in good condition and is to follow up with Kallie Locks, FNP this week to have labs evaluated. Stephanie Burch is instructed to increase activity slowly and balance with rest for the next few days, and use prescribed medication to complete treatment of pain  Diet: Regular Wt Readings from Last 3 Encounters:  04/18/19 125.6 kg  02/28/19 125.6 kg  02/15/19 122 kg    History of present illness:  Stephanie Burch, a 39 year old female with a medical history significant for sickle cell disease, chronic pain syndrome, opiate dependence and tolerance, history of anemia of chronic disease, history of depression, history of morbid obesity presents complaining of left side pain and bilateral lower extremity pain.  Pain is consistent with her typical sickle cell crisis.  She endorses increased pain with deep breathing, primarily to left side.  She says the pain has been present over the past several weeks.  Pain is unrelieved by home medications.  She admits to taking more medications than prescribed for greater pain control.  Pain intensity is 9/10 characterized as constant and throbbing.  She last had oxycodone around 4 AM without sustained relief.  She denies headache, shortness of breath, paresthesias, dizziness,  nausea, vomiting, or diarrhea.  Patient has had no sick contacts, she has not traveled recently, or been exposed to anyone with active COVID-19 infection.  Sickle cell day infusion center course: Patient found to have blood pressure of 137/68, pulse 98, respirations 15, oxygen saturation 97% on RA, and temperature 97.9 F.  Patient underwent chest x-ray, shows no acute cardiopulmonary process.  EKG shows normal sinus rhythm with T wave abnormality.  Patient's pain persists despite IV Dilaudid PCA, IV fluids, IV Toradol, and Tylenol.  Patient admitted to MedSurg for further management of sickle cell pain crisis.  Hospital Course:  Sickle cell disease with pain crisis Patient was admitted for sickle cell pain crisis and managed appropriately with IVF, IV Dilaudid via PCA and IV Toradol, as well as other adjunct therapies per sickle cell pain management protocols.  IV Dilaudid PCA weaned appropriately.  Patient transition to home medications.  She is requesting discharge home today.  Pain intensity is 5/10, which is at goal.  She says that she can manage at home on current medication regimen.  Patient is inquiring about home pain medications.  Opiate medications are prescribed by patient's PCP.  She has pain medication agreement in place with PCP.  Advised to defer to PCP for opiate prescriptions.  On admission, patient complained of varying left-sided discomfort with deep breathing.  She underwent CT of the chest which ruled out pulmonary embolism.  There was an incidental finding of a 6 mm pulmonary nodule in the right middle lobe.  The patient is low risk for lung cancer and no further  follow-up is required.  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  There appeared to be some free fluid about the spleen in the left upper quadrant.  The spleen is somewhat heterogeneous in appearance with questionable wedge-shaped hypoattenuating defects.  The findings could represent developing splenic  infarcts being that is in the setting of sickle cell disease. Patient complained of vaginal odor and some urinary symptoms: She underwent vaginal wet prep which showed the presence of yeast.  Patient was treated with Diflucan 200 mg x 1 dose.  Patient advised to follow-up with PCP if problem persists.  Patient is afebrile and maintaining oxygen saturation at 100% on RA.  She is alert, oriented, and ambulating without assistance.  Patient is aware of primary care appointment in 1 week, where she will have CBC and CMP repeated  Patient was discharged home today in a hemodynamically stable condition.   Discharge Exam: Vitals:   04/21/19 2030 04/22/19 0225  BP: (!) 157/93 137/74  Pulse: 78 76  Resp: 16 16  Temp: 98.9 F (37.2 C) 98 F (36.7 C)  SpO2: 96% 97%   Vitals:   04/21/19 0842 04/21/19 1319 04/21/19 2030 04/22/19 0225  BP:  124/67 (!) 157/93 137/74  Pulse:  72 78 76  Resp: 20 20 16 16   Temp:  98.7 F (37.1 C) 98.9 F (37.2 C) 98 F (36.7 C)  TempSrc:  Oral Oral   SpO2: 99% 100% 96% 97%  Weight:      Height:        General appearance : Awake, alert, not in any distress. Speech Clear. Not toxic looking.  Severe obesity. HEENT: Atraumatic and Normocephalic, pupils equally reactive to light and accomodation Neck: Supple, no JVD. No cervical lymphadenopathy.  Chest: Good air entry bilaterally, no added sounds  CVS: S1 S2 regular, no murmurs.  Abdomen: Bowel sounds present, Non tender and not distended with no gaurding, rigidity or rebound. Extremities: B/L Lower Ext shows no edema, both legs are warm to touch Neurology: Awake alert, and oriented X 3, CN II-XII intact, Non focal Skin: No Rash  Discharge Instructions  Discharge Instructions    Discharge patient   Complete by: As directed    Discharge disposition: 01-Home or Self Care   Discharge patient date: 04/22/2019     Allergies as of 04/22/2019   No Known Allergies     Medication List    TAKE these  medications   docusate sodium 100 MG capsule Commonly known as: Colace Take 1 capsule (100 mg total) by mouth 2 (two) times daily.   DULoxetine 20 MG capsule Commonly known as: CYMBALTA TAKE 1 CAPSULE BY MOUTH EVERY DAY What changed: how much to take   folic acid 1 MG tablet Commonly known as: FOLVITE Take 1 tablet (1 mg total) by mouth daily.   hydroxyurea 500 MG capsule Commonly known as: Hydrea Take 4 capsules (2,000 mg total) by mouth daily. May take with food to minimize GI side effects.   ibuprofen 800 MG tablet Commonly known as: ADVIL TAKE 1 TABLET (800 MG TOTAL) BY MOUTH EVERY 8 (EIGHT) HOURS AS NEEDED FOR HEADACHE, MILD PAIN, MODERATE PAIN OR CRAMPING.   naloxone 0.4 MG/ML injection Commonly known as: NARCAN As needed   ondansetron 4 MG tablet Commonly known as: ZOFRAN TAKE 1 TABLET BY MOUTH EVERY 8 HOURS AS NEEDED FOR NAUSEA AND VOMITING What changed: See the new instructions.   Oxbryta 500 MG Tabs tablet Generic drug: voxelotor Take 1,500 mg by mouth  daily. For Sickle Cell Anemia   Oxycodone HCl 20 MG Tabs Take 1 tablet (20 mg total) by mouth every 4 (four) hours as needed (for pain).   prochlorperazine 10 MG tablet Commonly known as: COMPAZINE Take 1 tablet (10 mg total) by mouth every 6 (six) hours as needed for nausea or vomiting. Take if Ondansetron is not effective.   tiZANidine 4 MG capsule Commonly known as: ZANAFLEX TAKE 1 CAPSULE (4 MG TOTAL) BY MOUTH 3 (THREE) TIMES DAILY.   vitamin B-12 50 MCG tablet Commonly known as: CYANOCOBALAMIN Take 1 tablet (50 mcg total) by mouth daily.   Vitamin D (Ergocalciferol) 1.25 MG (50000 UNIT) Caps capsule Commonly known as: DRISDOL Take 1 capsule (50,000 Units total) by mouth every 7 (seven) days.   Xtampza ER 13.5 MG C12a Generic drug: oxyCODONE ER Take 13.5 mg by mouth every 12 (twelve) hours.       The results of significant diagnostics from this hospitalization (including imaging, microbiology,  ancillary and laboratory) are listed below for reference.    Significant Diagnostic Studies: DG Chest 2 View  Result Date: 04/18/2019 CLINICAL DATA:  Chest pain EXAM: CHEST - 2 VIEW COMPARISON:  November 10, 2018 FINDINGS: Lungs are clear. Heart is upper normal in size with pulmonary vascularity normal. No adenopathy. No pneumothorax. No bone lesions. IMPRESSION: Lungs clear.  Heart upper normal in size.  No evident adenopathy. Electronically Signed   By: Bretta Bang III M.D.   On: 04/18/2019 15:12   CT ANGIO CHEST PE W OR WO CONTRAST  Result Date: 04/19/2019 CLINICAL DATA:  Shortness of breath. EXAM: CT ANGIOGRAPHY CHEST WITH CONTRAST TECHNIQUE: Multidetector CT imaging of the chest was performed using the standard protocol during bolus administration of intravenous contrast. Multiplanar CT image reconstructions and MIPs were obtained to evaluate the vascular anatomy. CONTRAST:  OMNIPAQUE IOHEXOL 350 MG/ML SOLN COMPARISON:  None. FINDINGS: Cardiovascular: Evaluation is limited by respiratory motion artifact.Given this limitation, no acute pulmonary embolism was detected. Detection of smaller pulmonary emboli is limited. The main pulmonary artery is within normal limits for size. There is no CT evidence of acute right heart strain. The visualized aorta is normal. Heart size is mildly enlarged. There is no significant pericardial effusion. Mediastinum/Nodes: --No mediastinal or hilar lymphadenopathy. --No axillary lymphadenopathy. --No supraclavicular lymphadenopathy. --Normal thyroid gland. --The esophagus is unremarkable Lungs/Pleura: There is a 6 mm pulmonary nodule in the right middle lobe (axial series 7, image 58). There is no pneumothorax. No large pleural effusion. No focal infiltrate. There is some atelectasis at the lung bases, left worse than right. Upper Abdomen: There appears to be some free fluid about the spleen in the left upper quadrant. The spleen is somewhat heterogeneous in  appearance with questionable wedge-shaped hypoattenuating defects. 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 There is no acute displaced fracture. Review of the MIP images confirms the above findings. IMPRESSION: 1. Evaluation is limited by respiratory motion artifact. Given this limitation, no acute pulmonary embolism was detected. 2. A 6 mm pulmonary nodule in the right middle lobe. If the patient is low risk for lung cancer, no further follow up is required. If the patient is high risk for lung cancer, an optional CT of the chest can be performed in 12 months. 3. 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 4. There appears to be some free fluid about the spleen in the left upper quadrant. The spleen is somewhat heterogeneous in appearance with questionable wedge-shaped hypoattenuating defects.  Findings could represent developing splenic infarcts in the setting of sickle cell disease. Electronically Signed   By: Constance Holster M.D.   On: 04/19/2019 19:11    Microbiology: Recent Results (from the past 240 hour(s))  SARS CORONAVIRUS 2 (TAT 6-24 HRS) Nasopharyngeal Nasopharyngeal Swab     Status: None   Collection Time: 04/18/19  6:30 PM   Specimen: Nasopharyngeal Swab  Result Value Ref Range Status   SARS Coronavirus 2 NEGATIVE NEGATIVE Final    Comment: (NOTE) SARS-CoV-2 target nucleic acids are NOT DETECTED. The SARS-CoV-2 RNA is generally detectable in upper and lower respiratory specimens during the acute phase of infection. Negative results do not preclude SARS-CoV-2 infection, do not rule out co-infections with other pathogens, and should not be used as the sole basis for treatment or other patient management decisions. Negative results must be combined with clinical observations, patient history, and epidemiological information. The expected result is Negative. Fact Sheet for Patients: SugarRoll.be Fact Sheet for  Healthcare Providers: https://www.woods-mathews.com/ This test is not yet approved or cleared by the [place] FDA and  has been authorized for detection and/or diagnosis of SARS-CoV-2 by FDA under an Emergency Use Authorization (EUA). This EUA will remain  in effect (meaning this test can be used) for the duration of the COVID-19 declaration under Section 56 4(b)(1) of the Act, 21 U.S.C. section 360bbb-3(b)(1), unless the authorization is terminated or revoked sooner. Performed at [facility], [place] [address] [place], [place] [address]   Urine Culture     Status: Abnormal   Collection Time: 04/20/19  1:48 PM   Specimen: Urine, Clean Catch  Result Value Ref Range Status   Specimen Description   Final    URINE, CLEAN CATCH Performed at [facility], [place] [address] [place], [place] [address]    Special Requests   Final    NONE Performed at [facility], [place] [address], [place], [place] [address]    Culture (A)  Final    <10,000 COLONIES/mL INSIGNIFICANT GROWTH Performed at [place] [address] [place], [place] [address]    Report Status 04/22/2019 FINAL  Final  Wet prep, genital     Status: Abnormal   Collection Time: 04/20/19  3:42 PM   Specimen: Vaginal  Result Value Ref Range Status   Yeast Wet Prep HPF POC PRESENT (A) NONE SEEN Final   Trich, Wet Prep NONE SEEN NONE SEEN Final   Clue Cells Wet Prep HPF POC NONE SEEN NONE SEEN Final   WBC, Wet Prep HPF POC FEW (A) NONE SEEN Final   Sperm NONE SEEN  Final    Comment: Performed at [facility], [place] [address] [place], [place] [address]     Labs: Basic Metabolic Panel: Recent Labs  Lab 04/18/19 0956 04/20/19 0545  NA 144 141  K 3.7 3.6  CL 105 107  CO2 29 28  GLUCOSE 106* 99  BUN 8 11  CREATININE 0.74 0.66  CALCIUM 9.0 8.5*   Liver Function Tests: Recent Labs  Lab 04/18/19 0956  AST 23  ALT 33  ALKPHOS 66   BILITOT 1.4*  PROT 7.3  ALBUMIN 3.4*   No results for input(s): LIPASE, AMYLASE in the last 168 hours. No results for input(s): AMMONIA in the last 168 hours. CBC: Recent Labs  Lab 04/18/19 0956 04/20/19 0545 04/22/19 0801  WBC 13.2* 8.4 6.9  NEUTROABS 9.9*  --   --   HGB  9.8* 8.8* 9.7*  HCT 30.3* 26.6* 28.5*  MCV 72.5* 73.1* 73.1*  PLT 265 231 226   Cardiac Enzymes: No results for input(s): CKTOTAL, CKMB, CKMBINDEX, TROPONINI in the last 168 hours. BNP: Invalid input(s): POCBNP CBG: No results for input(s): GLUCAP in the last 168 hours.  Time coordinating discharge: 50 minutes  Signed:   Nolon NationsLachina Moore Jagjit Riner  APRN, MSN, FNP-C [facility] [address]  [place], [place] [id number] [phone]  [facility] 04/22/2019, 11:29 AM

## 2019-04-22 NOTE — TOC Transition Note (Signed)
Transition of Care [facility]) - CM/SW Discharge Note   Patient Details  Name: Mckinna Demars MRN: [id number] Date of Birth: [birth year]  Transition of Care [facility]) CM/SW Contact:  Amada Jupiter, LCSW Phone Number: 04/22/2019, 1:12 PM   Clinical Narrative:   Pt for d/c today.  No needs identified.    Final next level of care: Home/Self Care Barriers to Discharge: Barriers Resolved   Patient Goals and CMS Choice Patient states their goals for this hospitalization and ongoing recovery are:: Primary goal for pain management   Choice offered to / list presented to : NA  Discharge Placement                       Discharge Plan and Services In-house Referral: Clinical Social Work   Post Acute Care Choice: NA          DME Arranged: N/A DME Agency: NA       HH Arranged: NA HH Agency: NA        Social Determinants of Health (SDOH) Interventions     Readmission Risk Interventions Readmission Risk Prevention Plan 04/20/2019 12/23/2018  Transportation Screening Complete Complete  PCP or Specialist Appt within 3-5 Days Complete Complete  HRI or Home Care Consult Not Complete Not Complete  HRI or Home Care Consult comments NA NA  Social Work Consult for Recovery Care Planning/Counseling Complete Complete  Palliative Care Screening Not Applicable Not Applicable  Medication Review [employer]) Complete Complete

## 2019-04-22 NOTE — Telephone Encounter (Signed)
Pt has been discharged from hospital and wants meds sent to [facility] on [address] in [facility]

## 2019-04-22 NOTE — Telephone Encounter (Signed)
Pt called regarding filling oxycodone 20mg  once she is released from the hospital.

## 2019-04-22 NOTE — Progress Notes (Signed)
Pt is being discharged home today. Discharge instructions including medications and follow up appointments given. Pt had not further questions at this time.

## 2019-04-25 NOTE — Telephone Encounter (Signed)
Done

## 2019-04-27 ENCOUNTER — Ambulatory Visit (INDEPENDENT_AMBULATORY_CARE_PROVIDER_SITE_OTHER): Payer: Medicaid Other | Admitting: Clinical

## 2019-04-27 ENCOUNTER — Ambulatory Visit (INDEPENDENT_AMBULATORY_CARE_PROVIDER_SITE_OTHER): Payer: Medicaid Other | Admitting: Family Medicine

## 2019-04-27 ENCOUNTER — Encounter: Payer: Self-pay | Admitting: Family Medicine

## 2019-04-27 ENCOUNTER — Other Ambulatory Visit: Payer: Self-pay

## 2019-04-27 VITALS — BP 129/74 | HR 95 | Temp 98.9°F | Ht 65.0 in | Wt 278.0 lb

## 2019-04-27 DIAGNOSIS — D571 Sickle-cell disease without crisis: Secondary | ICD-10-CM | POA: Diagnosis not present

## 2019-04-27 DIAGNOSIS — Z09 Encounter for follow-up examination after completed treatment for conditions other than malignant neoplasm: Secondary | ICD-10-CM | POA: Diagnosis not present

## 2019-04-27 DIAGNOSIS — F419 Anxiety disorder, unspecified: Secondary | ICD-10-CM | POA: Diagnosis not present

## 2019-04-27 DIAGNOSIS — G894 Chronic pain syndrome: Secondary | ICD-10-CM | POA: Diagnosis not present

## 2019-04-27 DIAGNOSIS — F1190 Opioid use, unspecified, uncomplicated: Secondary | ICD-10-CM | POA: Diagnosis not present

## 2019-04-27 DIAGNOSIS — R110 Nausea: Secondary | ICD-10-CM

## 2019-04-27 DIAGNOSIS — M62838 Other muscle spasm: Secondary | ICD-10-CM

## 2019-04-27 LAB — POCT URINALYSIS DIPSTICK
Bilirubin, UA: NEGATIVE
Blood, UA: NEGATIVE
Glucose, UA: NEGATIVE
Ketones, UA: NEGATIVE
Leukocytes, UA: NEGATIVE
Nitrite, UA: NEGATIVE
Protein, UA: NEGATIVE
Spec Grav, UA: 1.025 (ref 1.010–1.025)
Urobilinogen, UA: 1 E.U./dL
pH, UA: 6 (ref 5.0–8.0)

## 2019-04-27 NOTE — Progress Notes (Signed)
[facility]  Hospital Follow Up  Subjective:  Patient ID: Stephanie Burch, female    DOB: 09/17/1980  Age: 39 y.o. MRN: [id number]  CC:  Chief Complaint  Patient presents with  . Hospitalization Follow-up    04/18/2019 [facility] Sickle Cell     HPI Stephanie Burch is a 39 year old female who presents for Hospital Follow Up today.   Past Medical History:  Diagnosis Date  . Nausea   . Sickle cell anemia (HCC)   . Vitamin B12 deficiency 12/2018  . Vitamin D deficiency    Current Status: Since her last office visit, she has had a Hospital Visit for Sickle Cell Crisis. Today, she is doing well with no complaints. She states that she has pain in her arms, back, and legs. She rates her pain today at 5/10. She has not had a hospital visit for Sickle Cell Crisis since 04/18/2019 where she was treated and discharged on 04/22/2019. She states that she is routinely out of meds 1-2 day before her pain medications are due to be refilled. She has reports of left flank pain occasionally. She is currently taking all medications as prescribed and staying well hydrated. She reports occasional nausea, constipation, dizziness and headaches. She denies fevers, chills, fatigue, recent infections, weight loss, and night sweats. She has not had any visual changes, and falls. No chest pain, heart palpitations, cough and shortness of breath reported. No reports of GI problems such as vomiting, and diarrhea. She has no reports of blood in stools, dysuria and hematuria. Her anxiety is mild today. She denies suicidal ideations, homicidal ideations, or auditory hallucinations.  History reviewed. No pertinent surgical history.  Family History  Problem Relation Age of Onset  . Hypertension Mother   . Sickle cell trait Mother   . Diabetes Father   . Sickle cell trait Father     Social History   Socioeconomic History  . Marital status: Single    Spouse name: Not on  file  . Number of children: Not on file  . Years of education: Not on file  . Highest education level: Not on file  Occupational History  . Not on file  Tobacco Use  . Smoking status: Never Smoker  . Smokeless tobacco: Never Used  Substance and Sexual Activity  . Alcohol use: Not Currently  . Drug use: Never  . Sexual activity: Yes    Birth control/protection: None  Other Topics Concern  . Not on file  Social History Narrative  . Not on file   Social Determinants of Health   Financial Resource Strain:   . Difficulty of Paying Living Expenses:   Food Insecurity:   . Worried About Programme researcher, broadcasting/film/video in the Last Year:   . Barista in the Last Year:   Transportation Needs:   . Freight forwarder (Medical):   Marland Kitchen Lack of Transportation (Non-Medical):   Physical Activity:   . Days of Exercise per Week:   . Minutes of Exercise per Session:   Stress:   . Feeling of Stress :   Social Connections:   . Frequency of Communication with Friends and Family:   . Frequency of Social Gatherings with Friends and Family:   . Attends Religious Services:   . Active Member of Clubs or Organizations:   . Attends Banker Meetings:   Marland Kitchen Marital Status:   Intimate Partner Violence:   . Fear of Current  or Ex-Partner:   . Emotionally Abused:   Marland Kitchen Physically Abused:   . Sexually Abused:     Outpatient Medications Prior to Visit  Medication Sig Dispense Refill  . docusate sodium (COLACE) 100 MG capsule Take 1 capsule (100 mg total) by mouth 2 (two) times daily. 10 capsule 0  . DULoxetine (CYMBALTA) 20 MG capsule TAKE 1 CAPSULE BY MOUTH EVERY DAY (Patient taking differently: Take 20 mg by mouth daily. ) 90 capsule 1  . folic acid (FOLVITE) 1 MG tablet Take 1 tablet (1 mg total) by mouth daily. 30 tablet 11  . hydroxyurea (HYDREA) 500 MG capsule Take 4 capsules (2,000 mg total) by mouth daily. May take with food to minimize GI side effects. (Patient not taking: Reported on  02/11/2019) 120 capsule 5  . ibuprofen (ADVIL) 800 MG tablet TAKE 1 TABLET (800 MG TOTAL) BY MOUTH EVERY 8 (EIGHT) HOURS AS NEEDED FOR HEADACHE, MILD PAIN, MODERATE PAIN OR CRAMPING. 30 tablet 3  . naloxone (NARCAN) 0.4 MG/ML injection As needed (Patient not taking: Reported on 02/28/2019) 1 mL 0  . ondansetron (ZOFRAN) 4 MG tablet TAKE 1 TABLET BY MOUTH EVERY 8 HOURS AS NEEDED FOR NAUSEA AND VOMITING (Patient taking differently: Take 4 mg by mouth every 8 (eight) hours as needed for nausea or vomiting. ) 30 tablet 2  . oxyCODONE ER (XTAMPZA ER) 13.5 MG C12A Take 13.5 mg by mouth every 12 (twelve) hours. 60 capsule 0  . Oxycodone HCl 20 MG TABS Take 1 tablet (20 mg total) by mouth every 4 (four) hours as needed (for pain). 90 tablet 0  . prochlorperazine (COMPAZINE) 10 MG tablet Take 1 tablet (10 mg total) by mouth every 6 (six) hours as needed for nausea or vomiting. Take if Ondansetron is not effective. (Patient not taking: Reported on 04/18/2019) 30 tablet 3  . tiZANidine (ZANAFLEX) 4 MG capsule TAKE 1 CAPSULE (4 MG TOTAL) BY MOUTH 3 (THREE) TIMES DAILY. 90 capsule 1  . vitamin B-12 (CYANOCOBALAMIN) 50 MCG tablet Take 1 tablet (50 mcg total) by mouth daily. 30 tablet 6  . Vitamin D, Ergocalciferol, (DRISDOL) 1.25 MG (50000 UT) CAPS capsule Take 1 capsule (50,000 Units total) by mouth every 7 (seven) days. 5 capsule 6  . voxelotor (OXBRYTA) 500 MG TABS tablet Take 1,500 mg by mouth daily. For Sickle Cell Anemia     No facility-administered medications prior to visit.    No Known Allergies  ROS Review of Systems  Constitutional: Negative.   HENT: Negative.   Eyes: Negative.   Respiratory: Negative.   Cardiovascular: Negative.   Gastrointestinal: Positive for abdominal distention, constipation (occasional ) and nausea (occasional).  Endocrine: Negative.   Genitourinary: Negative.   Musculoskeletal: Positive for arthralgias (generalized. ).  Skin: Negative.   Allergic/Immunologic: Negative.     Neurological: Positive for dizziness (occasional ) and headaches (occasional ).  Hematological: Negative.   Psychiatric/Behavioral: Negative.       Objective:    Physical Exam  Constitutional: She is oriented to person, place, and time. She appears well-developed and well-nourished.  HENT:  Head: Normocephalic and atraumatic.  Eyes: Conjunctivae are normal.  Cardiovascular: Normal rate, regular rhythm, normal heart sounds and intact distal pulses.  Pulmonary/Chest: Effort normal and breath sounds normal.  Abdominal: Soft. Bowel sounds are normal. She exhibits distension.  Musculoskeletal:        General: Normal range of motion.     Cervical back: Normal range of motion and neck supple.  Neurological: She is  alert and oriented to person, place, and time.  Skin: Skin is warm and dry.  Psychiatric: She has a normal mood and affect. Her behavior is normal. Judgment and thought content normal.  Nursing note and vitals reviewed.   BP 129/74   Pulse 95   Temp 98.9 F (37.2 C) (Oral)   Ht 5\' 5"  (1.651 m)   Wt 278 lb (126.1 kg)   LMP 04/08/2019   BMI 46.26 kg/m  Wt Readings from Last 3 Encounters:  04/27/19 278 lb (126.1 kg)  04/18/19 276 lb 14.4 oz (125.6 kg)  02/28/19 277 lb (125.6 kg)     Health Maintenance Due  Topic Date Due  . TETANUS/TDAP  Never done  . PAP SMEAR-Modifier  Never done  . INFLUENZA VACCINE  Never done    There are no preventive care reminders to display for this patient.  Lab Results  Component Value Date   TSH 1.120 11/15/2018   Lab Results  Component Value Date   WBC 6.9 04/22/2019   HGB 9.7 (L) 04/22/2019   HCT 28.5 (L) 04/22/2019   MCV 73.1 (L) 04/22/2019   PLT 226 04/22/2019   Lab Results  Component Value Date   NA 141 04/20/2019   K 3.6 04/20/2019   CO2 28 04/20/2019   GLUCOSE 99 04/20/2019   BUN 11 04/20/2019   CREATININE 0.66 04/20/2019   BILITOT 1.4 (H) 04/18/2019   ALKPHOS 66 04/18/2019   AST 23 04/18/2019   ALT 33  04/18/2019   PROT 7.3 04/18/2019   ALBUMIN 3.4 (L) 04/18/2019   CALCIUM 8.5 (L) 04/20/2019   ANIONGAP 6 04/20/2019   Lab Results  Component Value Date   CHOL 84 (L) 11/15/2018   Lab Results  Component Value Date   HDL 33 (L) 11/15/2018   Lab Results  Component Value Date   LDLCALC 34 11/15/2018   Lab Results  Component Value Date   TRIG 84 11/15/2018   Lab Results  Component Value Date   CHOLHDL 2.5 11/15/2018   Lab Results  Component Value Date   HGBA1C 4.1 09/11/2017      Assessment & Plan:   1. Hospital discharge follow-up  2. Hb-SS disease without crisis [facility]) She is doing well today r/t her chronic pain management. Detailed discussion today on adopting other methods of managing her chronic pain. We discussed possible future referral to Physical Therapy. She will continue to take pain medications as prescribed; will continue to avoid extreme heat and cold; will continue to eat a healthy diet and drink at least 64 ounces of water daily; continue stool softener as needed; will avoid colds and flu; will continue to get plenty of sleep and rest; will continue to avoid high stressful situations and remain infection free; will continue Folic Acid 1 mg daily to avoid sickle cell crisis. Continue to follow up with Hematologist as needed.  - POCT urinalysis dipstick  3. Chronic, continuous use of opioids  4. Chronic pain syndrome  5. Muscle spasms of both lower extremities  6. Anxiety Mild today. She has visit with Estanislado Emms, LCSW in our office today.   7. Nausea Stable today.   8. Follow up She will follow up in 2 months.   No orders of the defined types were placed in this encounter.   Orders Placed This Encounter  Procedures  . POCT urinalysis dipstick    Referral Orders  No referral(s) requested today    Kathe Becton,  MSN, FNP-BC [place] [facility] [address] [place],  [place] [address] [phone]- fax   Problem List Items Addressed This Visit      Other   Anxiety   Chronic pain syndrome   Chronic, continuous use of opioids   Hb-SS disease without crisis (HCC)   Relevant Orders   POCT urinalysis dipstick (Completed)   Muscle spasms of both lower extremities   Nausea    Other Visit Diagnoses    Hospital discharge follow-up    -  Primary   Follow up          No orders of the defined types were placed in this encounter.   Follow-up: No follow-ups on file.    Kallie Locks, FNP

## 2019-04-27 NOTE — BH Specialist Note (Signed)
Integrated Behavioral Health Note  Session Start time: 10:00   End Time: 10:45 Total Time: 45  Type of Service: Behavioral Health - Individual/Family Interpreter: No.   Interpreter Name & Language: none  Session #: 10/10  SUBJECTIVE: Stephanie Burch is a 39 y.o. female brought in by patient.  Pt./Family was referred by NP for:  stress precipitating sickle cell crises, recently also for overuse of pain medications. 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 Reduce symptoms of: anxiety, depression and stress  2. Increase knowledge and/or ability of: coping skills and self-management skills  3. Demonstrate ability to: Increase healthy adjustment to current life circumstances  INTERVENTIONS: Interventions utilized:  Motivational Interviewing and Supportive Counseling Standardized Assessments completed: Not Needed  ASSESSMENT: Patient currently experiencing difficulty in managing the emotional toll of her chronic pain experience. Reports mood swings and attributes this to pain and her increasing use of pain medications. Patient feeling distressed about recent pain episode in which medications were not helping despite taking additional doses. Discussed risks of overuse of medications.    Motivational interviewing approach today. Patient is in contemplation stage about reducing her pain medication use. Also feels that pain is compounded by stress and wants to reduce stress. Clarified treatment goals related to these problems. Explored ideas around pain acceptance. Assigned pain diary to identify problematic thoughts associated with pain experience. Will plan to practice mindfulness strategies at next session, as patient already has some practice with these and can practice implementing them at times of stress.    Patient may benefit from continued supportive counseling including CBT and ACT approaches to challenging and thoughts and core beliefs about self, relationships, and pain. Patient may also benefit from continued psycho-education on medication tolerance.  PLAN: 1. Follow up with behavioral health clinician on: 06/02/19 2. Behavioral recommendations: pain diary 3. Referral(s): Integrated [employer] (In Clinic)  Abigail Butts, LCSW [facility] [phone]

## 2019-05-03 ENCOUNTER — Telehealth: Payer: Self-pay | Admitting: Family Medicine

## 2019-05-03 ENCOUNTER — Other Ambulatory Visit: Payer: Self-pay | Admitting: Family Medicine

## 2019-05-03 DIAGNOSIS — G894 Chronic pain syndrome: Secondary | ICD-10-CM

## 2019-05-03 DIAGNOSIS — F1190 Opioid use, unspecified, uncomplicated: Secondary | ICD-10-CM

## 2019-05-03 MED ORDER — OXYCODONE HCL 20 MG PO TABS: 20.0000 mg | ORAL_TABLET | ORAL | 0 refills | Status: DC | PRN

## 2019-05-03 NOTE — Telephone Encounter (Signed)
Error

## 2019-05-04 ENCOUNTER — Telehealth: Payer: Self-pay | Admitting: Family Medicine

## 2019-05-04 NOTE — Telephone Encounter (Signed)
Pt called in refill for xtampza

## 2019-05-05 ENCOUNTER — Other Ambulatory Visit: Payer: Self-pay | Admitting: Family Medicine

## 2019-05-05 DIAGNOSIS — D571 Sickle-cell disease without crisis: Secondary | ICD-10-CM

## 2019-05-05 DIAGNOSIS — F1190 Opioid use, unspecified, uncomplicated: Secondary | ICD-10-CM

## 2019-05-05 DIAGNOSIS — G894 Chronic pain syndrome: Secondary | ICD-10-CM

## 2019-05-05 MED ORDER — XTAMPZA ER 13.5 MG PO C12A: 13.5000 mg | EXTENDED_RELEASE_CAPSULE | Freq: Two times a day (BID) | ORAL | 0 refills | Status: DC

## 2019-05-06 ENCOUNTER — Other Ambulatory Visit: Payer: Self-pay | Admitting: Family Medicine

## 2019-05-06 DIAGNOSIS — R110 Nausea: Secondary | ICD-10-CM

## 2019-05-09 NOTE — Telephone Encounter (Signed)
Done

## 2019-05-17 ENCOUNTER — Telehealth: Payer: Self-pay | Admitting: Family Medicine

## 2019-05-17 NOTE — Telephone Encounter (Signed)
Please call pt back to discuss medication quantity. Pt wants to know if they can get a few more pills per refill because they often run out a few days early.

## 2019-05-18 ENCOUNTER — Other Ambulatory Visit: Payer: Self-pay | Admitting: Family Medicine

## 2019-05-18 DIAGNOSIS — G894 Chronic pain syndrome: Secondary | ICD-10-CM

## 2019-05-18 DIAGNOSIS — F1190 Opioid use, unspecified, uncomplicated: Secondary | ICD-10-CM

## 2019-05-18 MED ORDER — OXYCODONE HCL 20 MG PO TABS: 20.0000 mg | ORAL_TABLET | ORAL | 0 refills | Status: DC | PRN

## 2019-05-18 NOTE — Telephone Encounter (Signed)
Pt called in refill on oxycodone 20mg 

## 2019-05-27 ENCOUNTER — Other Ambulatory Visit: Payer: Self-pay | Admitting: Family Medicine

## 2019-05-27 DIAGNOSIS — D5700 Hb-SS disease with crisis, unspecified: Secondary | ICD-10-CM

## 2019-05-27 DIAGNOSIS — Z79899 Other long term (current) drug therapy: Secondary | ICD-10-CM

## 2019-05-27 DIAGNOSIS — G894 Chronic pain syndrome: Secondary | ICD-10-CM

## 2019-06-01 ENCOUNTER — Telehealth: Payer: Self-pay | Admitting: Family Medicine

## 2019-06-02 ENCOUNTER — Other Ambulatory Visit: Payer: Self-pay | Admitting: Family Medicine

## 2019-06-02 ENCOUNTER — Ambulatory Visit: Payer: Medicaid Other | Admitting: Clinical

## 2019-06-02 ENCOUNTER — Telehealth (HOSPITAL_COMMUNITY): Payer: Self-pay

## 2019-06-02 ENCOUNTER — Other Ambulatory Visit: Payer: Self-pay

## 2019-06-02 ENCOUNTER — Non-Acute Institutional Stay (HOSPITAL_COMMUNITY)
Admission: AD | Admit: 2019-06-02 | Discharge: 2019-06-02 | Disposition: A | Discharge status: Home / Self Care | Payer: Medicaid Other | Source: Ambulatory Visit | Attending: Internal Medicine | Admitting: Internal Medicine

## 2019-06-02 DIAGNOSIS — Z79899 Other long term (current) drug therapy: Secondary | ICD-10-CM | POA: Diagnosis not present

## 2019-06-02 DIAGNOSIS — Z6841 Body Mass Index (BMI) 40.0 and over, adult: Secondary | ICD-10-CM | POA: Insufficient documentation

## 2019-06-02 DIAGNOSIS — G894 Chronic pain syndrome: Secondary | ICD-10-CM | POA: Diagnosis not present

## 2019-06-02 DIAGNOSIS — D571 Sickle-cell disease without crisis: Secondary | ICD-10-CM | POA: Insufficient documentation

## 2019-06-02 DIAGNOSIS — D5700 Hb-SS disease with crisis, unspecified: Secondary | ICD-10-CM | POA: Insufficient documentation

## 2019-06-02 DIAGNOSIS — D638 Anemia in other chronic diseases classified elsewhere: Secondary | ICD-10-CM | POA: Diagnosis not present

## 2019-06-02 DIAGNOSIS — F1190 Opioid use, unspecified, uncomplicated: Secondary | ICD-10-CM

## 2019-06-02 DIAGNOSIS — F1120 Opioid dependence, uncomplicated: Secondary | ICD-10-CM | POA: Diagnosis not present

## 2019-06-02 LAB — COMPREHENSIVE METABOLIC PANEL
ALT: 14 U/L (ref 0–44)
AST: 15 U/L (ref 15–41)
Albumin: 4.2 g/dL (ref 3.5–5.0)
Alkaline Phosphatase: 79 U/L (ref 38–126)
Anion gap: 8 (ref 5–15)
BUN: 7 mg/dL (ref 6–20)
CO2: 24 mmol/L (ref 22–32)
Calcium: 9.1 mg/dL (ref 8.9–10.3)
Chloride: 109 mmol/L (ref 98–111)
Creatinine, Ser: 0.58 mg/dL (ref 0.44–1.00)
GFR calc Af Amer: >60 mL/min (ref >60)
GFR calc non Af Amer: >60 mL/min (ref >60)
Glucose, Bld: 85 mg/dL (ref 70–99)
Potassium: 3.7 mmol/L (ref 3.5–5.1)
Sodium: 141 mmol/L (ref 135–145)
Total Bilirubin: 1.1 mg/dL (ref 0.3–1.2)
Total Protein: 8 g/dL (ref 6.5–8.1)

## 2019-06-02 LAB — CBC WITH DIFFERENTIAL/PLATELET
Abs Immature Granulocytes: 0.02 10*3/uL (ref 0.00–0.07)
Basophils Absolute: 0 10*3/uL (ref 0.0–0.1)
Basophils Relative: 0 %
Eosinophils Absolute: 0.1 10*3/uL (ref 0.0–0.5)
Eosinophils Relative: 1 %
HCT: 36.1 % (ref 36.0–46.0)
Hemoglobin: 12.3 g/dL (ref 12.0–15.0)
Immature Granulocytes: 0 %
Lymphocytes Relative: 23 %
Lymphs Abs: 2.2 10*3/uL (ref 0.7–4.0)
MCH: 24.7 pg — ABNORMAL LOW (ref 26.0–34.0)
MCHC: 34.1 g/dL (ref 30.0–36.0)
MCV: 72.5 fL — ABNORMAL LOW (ref 80.0–100.0)
Monocytes Absolute: 0.7 10*3/uL (ref 0.1–1.0)
Monocytes Relative: 7 %
Neutro Abs: 6.8 10*3/uL (ref 1.7–7.7)
Neutrophils Relative %: 69 %
Platelets: 248 10*3/uL (ref 150–400)
RBC: 4.98 MIL/uL (ref 3.87–5.11)
RDW: 15.6 % — ABNORMAL HIGH (ref 11.5–15.5)
WBC: 9.8 10*3/uL (ref 4.0–10.5)
nRBC: 0.2 % (ref 0.0–0.2)

## 2019-06-02 LAB — RETICULOCYTES
Immature Retic Fract: 23.6 % — ABNORMAL HIGH (ref 2.3–15.9)
RBC.: 4.98 MIL/uL (ref 3.87–5.11)
Retic Count, Absolute: 196.2 10*3/uL — ABNORMAL HIGH (ref 19.0–186.0)
Retic Ct Pct: 3.9 % — ABNORMAL HIGH (ref 0.4–3.1)

## 2019-06-02 MED ORDER — ONDANSETRON HCL 4 MG/2ML IJ SOLN: 4.0000 mg | Freq: Four times a day (QID) | INTRAMUSCULAR | Status: DC | PRN

## 2019-06-02 MED ORDER — PROMETHAZINE HCL 25 MG PO TABS
25.0000 mg | ORAL_TABLET | Freq: Once | ORAL | Status: AC
  Administered 2019-06-02: 25 mg via ORAL
  Filled 2019-06-02: qty 1

## 2019-06-02 MED ORDER — ACETAMINOPHEN 500 MG PO TABS
1000.0000 mg | ORAL_TABLET | Freq: Once | ORAL | Status: AC
  Administered 2019-06-02: 13:00:00 1000 mg via ORAL
  Filled 2019-06-02: qty 2

## 2019-06-02 MED ORDER — SODIUM CHLORIDE 0.9% FLUSH: 9.0000 mL | INTRAVENOUS | Status: DC | PRN

## 2019-06-02 MED ORDER — OXYCODONE HCL 20 MG PO TABS: 20.0000 mg | ORAL_TABLET | ORAL | 0 refills | Status: DC | PRN

## 2019-06-02 MED ORDER — HYDROMORPHONE 1 MG/ML IV SOLN
INTRAVENOUS | Status: DC
  Administered 2019-06-02: 30 mg via INTRAVENOUS
  Administered 2019-06-02: 8.2 mg via INTRAVENOUS
  Filled 2019-06-02: qty 30

## 2019-06-02 MED ORDER — NALOXONE HCL 0.4 MG/ML IJ SOLN: 0.4000 mg | INTRAMUSCULAR | Status: DC | PRN

## 2019-06-02 MED ORDER — KETOROLAC TROMETHAMINE 30 MG/ML IJ SOLN
15.0000 mg | Freq: Once | INTRAMUSCULAR | Status: AC
  Administered 2019-06-02: 15:00:00 15 mg via INTRAVENOUS
  Filled 2019-06-02: qty 1

## 2019-06-02 MED ORDER — DIPHENHYDRAMINE HCL 25 MG PO CAPS: 25.0000 mg | ORAL_CAPSULE | ORAL | Status: DC | PRN

## 2019-06-02 MED ORDER — SODIUM CHLORIDE 0.45 % IV SOLN: INTRAVENOUS | Status: DC

## 2019-06-02 NOTE — H&P (Signed)
[facility] History and Physical   Date: 06/02/2019  Patient name: Stephanie Burch Medical record number: [id number] Date of birth: 1980-05-17 Age: 39 y.o. Gender: female PCP: Azzie Glatter, FNP  Attending physician: Tresa Garter, MD  Chief Complaint: Sickle cell pain  History of Present Illness: Stephanie Burch, is a 39 year old female with a medical history significant for sickle cell disease, chronic pain syndrome, opiate dependence and tolerance, morbid obesity, and history of anemia of chronic disease presents complaining of left arm pain.  Patient says that left arm pain is been persistent over the past 24 hours and has been unchanged by home medications.  She says that arm pain is different from her typical pain crisis, she typically has pain to lower extremities.  Pain intensity is 8/10 characterized as constant and throbbing.  She last had oxycodone this a.m. without sustained relief.  She says that she has been taking more medication than prescribed to alleviate arm pain without success.  She denies fever, chills, headache, shortness of breath, chest pain, urinary symptoms, nausea, vomiting, or diarrhea.  She has had no sick contacts, recent travel, or exposure to COVID-19.  Meds: Medications Prior to Admission  Medication Sig Dispense Refill Last Dose  . docusate sodium (COLACE) 100 MG capsule Take 1 capsule (100 mg total) by mouth 2 (two) times daily. 10 capsule 0   . DULoxetine (CYMBALTA) 20 MG capsule TAKE 1 CAPSULE BY MOUTH EVERY DAY (Patient taking differently: Take 20 mg by mouth daily. ) 90 capsule 1   . folic acid (FOLVITE) 1 MG tablet Take 1 tablet (1 mg total) by mouth daily. 30 tablet 11   . hydroxyurea (HYDREA) 500 MG capsule Take 4 capsules (2,000 mg total) by mouth daily. May take with food to minimize GI side effects. 120 capsule 5   . ibuprofen (ADVIL) 800 MG tablet TAKE 1 TABLET (800 MG TOTAL) BY MOUTH EVERY 8 (EIGHT) HOURS AS NEEDED FOR  HEADACHE, MILD PAIN, MODERATE PAIN OR CRAMPING. 30 tablet 3   . naloxone (NARCAN) 0.4 MG/ML injection As needed 1 mL 0   . ondansetron (ZOFRAN) 4 MG tablet TAKE 1 TABLET BY MOUTH EVERY 8 HOURS AS NEEDED FOR NAUSEA AND VOMITING 30 tablet 2   . oxyCODONE ER (XTAMPZA ER) 13.5 MG C12A Take 13.5 mg by mouth every 12 (twelve) hours. 60 capsule 0   . Oxycodone HCl 20 MG TABS Take 1 tablet (20 mg total) by mouth every 4 (four) hours as needed (for pain). 90 tablet 0   . prochlorperazine (COMPAZINE) 10 MG tablet Take 1 tablet (10 mg total) by mouth every 6 (six) hours as needed for nausea or vomiting. Take if Ondansetron is not effective. 30 tablet 3   . tiZANidine (ZANAFLEX) 4 MG capsule TAKE 1 CAPSULE (4 MG TOTAL) BY MOUTH 3 (THREE) TIMES DAILY. 90 capsule 1   . vitamin B-12 (CYANOCOBALAMIN) 50 MCG tablet Take 1 tablet (50 mcg total) by mouth daily. 30 tablet 6   . Vitamin D, Ergocalciferol, (DRISDOL) 1.25 MG (50000 UT) CAPS capsule Take 1 capsule (50,000 Units total) by mouth every 7 (seven) days. 5 capsule 6   . voxelotor (OXBRYTA) 500 MG TABS tablet Take 1,500 mg by mouth daily. For Sickle Cell Anemia       Allergies: Patient has no known allergies. Past Medical History:  Diagnosis Date  . Nausea   . Sickle cell anemia (HCC)   . Vitamin B12 deficiency 12/2018  . Vitamin D deficiency  No past surgical history on file. Family History  Problem Relation Age of Onset  . Hypertension Mother   . Sickle cell trait Mother   . Diabetes Father   . Sickle cell trait Father    Social History   Socioeconomic History  . Marital status: Single    Spouse name: Not on file  . Number of children: Not on file  . Years of education: Not on file  . Highest education level: Not on file  Occupational History  . Not on file  Tobacco Use  . Smoking status: Never Smoker  . Smokeless tobacco: Never Used  Substance and Sexual Activity  . Alcohol use: Not Currently  . Drug use: Never  . Sexual activity:  Yes    Birth control/protection: None  Other Topics Concern  . Not on file  Social History Narrative  . Not on file   Social Determinants of Health   Financial Resource Strain:   . Difficulty of Paying Living Expenses:   Food Insecurity:   . Worried About Programme researcher, broadcasting/film/video in the Last Year:   . Barista in the Last Year:   Transportation Needs:   . Freight forwarder (Medical):   Marland Kitchen Lack of Transportation (Non-Medical):   Physical Activity:   . Days of Exercise per Week:   . Minutes of Exercise per Session:   Stress:   . Feeling of Stress :   Social Connections:   . Frequency of Communication with Friends and Family:   . Frequency of Social Gatherings with Friends and Family:   . Attends Religious Services:   . Active Member of Clubs or Organizations:   . Attends Banker Meetings:   Marland Kitchen Marital Status:   Intimate Partner Violence:   . Fear of Current or Ex-Partner:   . Emotionally Abused:   Marland Kitchen Physically Abused:   . Sexually Abused:     Review of Systems: Review of Systems  Constitutional: Negative.   HENT: Negative.   Eyes: Negative.   Respiratory: Negative.   Cardiovascular: Negative.   Gastrointestinal: Negative.   Genitourinary: Negative.   Musculoskeletal: Positive for joint pain.  Skin: Negative.   Neurological: Negative.   Psychiatric/Behavioral: Negative.      Physical Exam: There were no vitals taken for this visit. Physical Exam Constitutional:      Appearance: Normal appearance.  HENT:     Head: Normocephalic.     Mouth/Throat:     Mouth: Mucous membranes are moist.  Eyes:     Pupils: Pupils are equal, round, and reactive to light.  Cardiovascular:     Rate and Rhythm: Normal rate and regular rhythm.  Pulmonary:     Effort: Pulmonary effort is normal.     Breath sounds: Normal breath sounds.  Abdominal:     General: Abdomen is flat. Bowel sounds are normal.  Skin:    General: Skin is warm.  Neurological:      General: No focal deficit present.     Mental Status: She is alert. Mental status is at baseline.  Psychiatric:        Mood and Affect: Mood normal.        Behavior: Behavior normal.        Thought Content: Thought content normal.        Judgment: Judgment normal.      Lab results: No results found for this or any previous visit (from the past 24 hour(s)).  Imaging results:  No results found.   Assessment & Plan: Patient admitted to sickle cell day infusion center for management of pain crisis.  Patient is opiate tolerant. Initiate IV dilaudid PCA. Settings of 0.6 mg, 10 minute lockout and 4 mg per hour IV fluids, 0.45% saline at 100 ml/hr Toradol 15 mg IV times one dose Tylenol 1000 mg by mouth times one dose Review CBC with differential, complete metabolic panel, and reticulocytes as results become available. Baseline hemoglobin is Pain intensity will be reevaluated in context of functioning and relationship to baseline as care progress If pain intensity remains elevated and/or sudden change in hemodynamic stability transition to inpatient services for higher level of care.     Nolon Nations  APRN, MSN, FNP-C [facility] [address] [place], [place] [address] [phone]  06/02/2019, 11:33 AM

## 2019-06-02 NOTE — Discharge Summary (Signed)
[facility] Discharge Summary   Patient ID: Stephanie Burch MRN: [id number] DOB/AGE: 37/25/1982 37 y.o.  Admit date: 06/02/2019 Discharge date: 06/02/2019  Primary Care Physician:  Kallie Locks, FNP  Admission Diagnoses:  Active Problems:   Sickle cell pain crisis [facility])   Discharge Medications:  Allergies as of 06/02/2019   No Known Allergies     Medication List    TAKE these medications   docusate sodium 100 MG capsule Commonly known as: Colace Take 1 capsule (100 mg total) by mouth 2 (two) times daily.   DULoxetine 20 MG capsule Commonly known as: CYMBALTA TAKE 1 CAPSULE BY MOUTH EVERY DAY What changed: how much to take   folic acid 1 MG tablet Commonly known as: FOLVITE Take 1 tablet (1 mg total) by mouth daily.   hydroxyurea 500 MG capsule Commonly known as: Hydrea Take 4 capsules (2,000 mg total) by mouth daily. May take with food to minimize GI side effects.   ibuprofen 800 MG tablet Commonly known as: ADVIL TAKE 1 TABLET (800 MG TOTAL) BY MOUTH EVERY 8 (EIGHT) HOURS AS NEEDED FOR HEADACHE, MILD PAIN, MODERATE PAIN OR CRAMPING.   naloxone 0.4 MG/ML injection Commonly known as: NARCAN As needed   ondansetron 4 MG tablet Commonly known as: ZOFRAN TAKE 1 TABLET BY MOUTH EVERY 8 HOURS AS NEEDED FOR NAUSEA AND VOMITING   Oxbryta 500 MG Tabs tablet Generic drug: voxelotor Take 1,500 mg by mouth daily. For Sickle Cell Anemia   Oxycodone HCl 20 MG Tabs Take 1 tablet (20 mg total) by mouth every 4 (four) hours as needed (for pain).   prochlorperazine 10 MG tablet Commonly known as: COMPAZINE Take 1 tablet (10 mg total) by mouth every 6 (six) hours as needed for nausea or vomiting. Take if Ondansetron is not effective.   tiZANidine 4 MG capsule Commonly known as: ZANAFLEX TAKE 1 CAPSULE (4 MG TOTAL) BY MOUTH 3 (THREE) TIMES DAILY.   vitamin B-12 50 MCG tablet Commonly known as: CYANOCOBALAMIN Take 1 tablet (50 mcg total) by mouth  daily.   Vitamin D (Ergocalciferol) 1.25 MG (50000 UNIT) Caps capsule Commonly known as: DRISDOL Take 1 capsule (50,000 Units total) by mouth every 7 (seven) days.   Xtampza ER 13.5 MG C12a Generic drug: oxyCODONE ER Take 13.5 mg by mouth every 12 (twelve) hours.        Consults:  None  Significant Diagnostic Studies:  No results found.  History of present illness:  Stephanie Burch, is a 39 year old female with a medical history significant for sickle cell disease, chronic pain syndrome, opiate dependence and tolerance, morbid obesity, and history of anemia of chronic disease presents complaining of left arm pain.  Patient says that left arm pain is been persistent over the past 24 hours and has been unchanged by home medications.  She says that arm pain is different from her typical pain crisis, she typically has pain to lower extremities.  Pain intensity is 8/10 characterized as constant and throbbing.  She last had oxycodone this a.m. without sustained relief.  She says that she has been taking more medication than prescribed to alleviate arm pain without success.  She denies fever, chills, headache, shortness of breath, chest pain, urinary symptoms, nausea, vomiting, or diarrhea.  She has had no sick contacts, recent travel, or exposure to COVID-19.   [facility] Course: Patient admitted to sickle cell day infusion center for management of pain crisis.  Reviewed all labs, consistent with patients  baseline Pain managed with IV dilaudid PCA with settings of 0.6 mg, 10 minute lockout and 4 mg per hour.  Toradol 15 mg IV times 1 dose Tylenol 1000 mg X1 IV fluids 0.45% saline at 100 ml/hr Pain improved throughout admission. Pain intensity is 5/10. Patient does not warrant admission on today.  She is alert, oriented, and ambulating without assistance.  She will discharge home with family in a hemodynamically stable condition.    Discharge instructions:  Resume all home  medications.   Follow up with PCP as previously  scheduled.   Discussed the importance of drinking 64 ounces of water daily, dehydration of red blood cells may lead further sickling.   Avoid all stressors that precipitate sickle cell pain crisis.     The patient was given clear instructions to go to ER or return to medical center if symptoms do not improve, worsen or new problems develop.       Physical Exam at Discharge:  BP (!) 105/54 (BP Location: Left Arm)   Pulse (!) 105   Temp 98.2 F (36.8 C) (Oral)   Resp 18   Ht 5\' 5"  (1.651 m)   Wt 260 lb (117.9 kg)   LMP 05/12/2019   SpO2 96%   BMI 43.27 kg/m    Physical Exam Constitutional:      Appearance: Normal appearance.  HENT:     Nose: Nose normal.     Mouth/Throat:     Mouth: Mucous membranes are moist.     Pharynx: Oropharynx is clear.  Eyes:     Pupils: Pupils are equal, round, and reactive to light.  Cardiovascular:     Rate and Rhythm: Normal rate and regular rhythm.     Pulses: Normal pulses.  Pulmonary:     Effort: Pulmonary effort is normal.  Abdominal:     General: Abdomen is flat. Bowel sounds are normal.  Skin:    General: Skin is warm.  Neurological:     General: No focal deficit present.     Mental Status: She is alert. Mental status is at baseline.  Psychiatric:        Mood and Affect: Mood normal.        Behavior: Behavior normal.        Thought Content: Thought content normal.        Judgment: Judgment normal.     Disposition at Discharge:   Discharge Orders:   Condition at Discharge:   Stable  Time spent on Discharge:  Greater than 30 minutes.  Signed:  [name]  APRN, MSN, FNP-C [facility] [address] [place], [place] [address] [phone]  06/02/2019, 3:20 PM

## 2019-06-02 NOTE — Discharge Instructions (Signed)
Sickle Cell Anemia, Adult  Sickle cell anemia is a condition where your red blood cells are shaped like sickles. Red blood cells carry oxygen through the body. Sickle-shaped cells do not live as long as normal red blood cells. They also clump together and block blood from flowing through the blood vessels. This prevents the body from getting enough oxygen. Sickle cell anemia causes organ damage and pain. It also increases the risk of infection. Follow these instructions at home: Medicines  Take over-the-counter and prescription medicines only as told by your doctor.  If you were prescribed an antibiotic medicine, take it as told by your doctor. Do not stop taking the antibiotic even if you start to feel better.  If you develop a fever, do not take medicines to lower the fever right away. Tell your doctor about the fever. Managing pain, stiffness, and swelling  Try these methods to help with pain: ? Use a heating pad. ? Take a warm bath. ? Distract yourself, such as by watching TV. Eating and drinking  Drink enough fluid to keep your pee (urine) clear or pale yellow. Drink more in hot weather and during exercise.  Limit or avoid alcohol.  Eat a healthy diet. Eat plenty of fruits, vegetables, whole grains, and lean protein.  Take vitamins and supplements as told by your doctor. Traveling  When traveling, keep these with you: ? Your medical information. ? The names of your doctors. ? Your medicines.  If you need to take an airplane, talk to your doctor first. Activity  Rest often.  Avoid exercises that make your heart beat much faster, such as jogging. General instructions  Do not use products that have nicotine or tobacco, such as cigarettes and e-cigarettes. If you need help quitting, ask your doctor.  Consider wearing a medical alert bracelet.  Avoid being in high places (high altitudes), such as mountains.  Avoid very hot or cold temperatures.  Avoid places where the  temperature changes a lot.  Keep all follow-up visits as told by your doctor. This is important. Contact a doctor if:  A joint hurts.  Your feet or hands hurt or swell.  You feel tired (fatigued). Get help right away if:  You have symptoms of infection. These include: ? Fever. ? Chills. ? Being very tired. ? Irritability. ? Poor eating. ? Throwing up (vomiting).  You feel dizzy or faint.  You have new stomach pain, especially on the left side.  You have a an erection (priapism) that lasts more than 4 hours.  You have numbness in your arms or legs.  You have a hard time moving your arms or legs.  You have trouble talking.  You have pain that does not go away when you take medicine.  You are short of breath.  You are breathing fast.  You have a long-term cough.  You have pain in your chest.  You have a bad headache.  You have a stiff neck.  Your stomach looks bloated even though you did not eat much.  Your skin is pale.  You suddenly cannot see well. Summary  Sickle cell anemia is a condition where your red blood cells are shaped like sickles.  Follow your doctor's advice on ways to manage pain, food to eat, activities to do, and steps to take for safe travel.  Get medical help right away if you have any signs of infection, such as a fever. This information is not intended to replace advice given to you by   your health care provider. Make sure you discuss any questions you have with your health care provider. Document Revised: 05/14/2018 Document Reviewed: 02/26/2016 Elsevier Patient Education  2020 [employer].  

## 2019-06-02 NOTE — Progress Notes (Signed)
Patient admitted to the day hospital for treatment of sickle cell pain crisis. Patient reported pain rated 8/10 in the left arm. Patient placed on Dilaudid PCA, given PO Tylenol, IV Toradol, PO Phenergan and hydrated with IV fluids. At discharge patient reported  pain at 4/10. Discharge instructions given to patient. Patient alert, oriented and ambulatory at discharge.

## 2019-06-02 NOTE — Telephone Encounter (Signed)
Pt called sickle cell center to come in for infusion.  States she is having sharp left arm pain that goes from her elbow to shoulder.  Pain is 8 out of 10. She has taken ibuprofen 800 mg and a muscle reliever in the early morning hours.  She states she is having no n/v/d, no chest or abdominal pain.  No covid symptoms or fever.  She states she has not been to the ED in months.  Spoke with provider [place] who advised that pt may come in for infusion.  Pt states she will have transportation home.

## 2019-06-03 NOTE — Telephone Encounter (Signed)
Done

## 2019-06-07 ENCOUNTER — Telehealth: Payer: Self-pay | Admitting: Family Medicine

## 2019-06-07 NOTE — Telephone Encounter (Signed)
Pt called in refill on xtampza

## 2019-06-09 ENCOUNTER — Ambulatory Visit: Payer: Medicaid Other | Admitting: Clinical

## 2019-06-09 ENCOUNTER — Other Ambulatory Visit: Payer: Self-pay

## 2019-06-09 ENCOUNTER — Other Ambulatory Visit: Payer: Self-pay | Admitting: Family Medicine

## 2019-06-09 DIAGNOSIS — M62838 Other muscle spasm: Secondary | ICD-10-CM

## 2019-06-09 DIAGNOSIS — D571 Sickle-cell disease without crisis: Secondary | ICD-10-CM

## 2019-06-09 DIAGNOSIS — G894 Chronic pain syndrome: Secondary | ICD-10-CM

## 2019-06-09 DIAGNOSIS — R110 Nausea: Secondary | ICD-10-CM

## 2019-06-09 DIAGNOSIS — F1190 Opioid use, unspecified, uncomplicated: Secondary | ICD-10-CM

## 2019-06-09 MED ORDER — XTAMPZA ER 13.5 MG PO C12A: 13.5000 mg | EXTENDED_RELEASE_CAPSULE | Freq: Two times a day (BID) | ORAL | 0 refills | Status: DC

## 2019-06-09 NOTE — Telephone Encounter (Signed)
Pt is requesting a refill on this medication 

## 2019-06-15 ENCOUNTER — Telehealth: Payer: Self-pay | Admitting: Family Medicine

## 2019-06-15 ENCOUNTER — Other Ambulatory Visit: Payer: Self-pay | Admitting: Family Medicine

## 2019-06-15 DIAGNOSIS — G894 Chronic pain syndrome: Secondary | ICD-10-CM

## 2019-06-15 DIAGNOSIS — F1190 Opioid use, unspecified, uncomplicated: Secondary | ICD-10-CM

## 2019-06-15 MED ORDER — OXYCODONE HCL 20 MG PO TABS: 20.0000 mg | ORAL_TABLET | ORAL | 0 refills | Status: DC | PRN

## 2019-06-15 NOTE — Telephone Encounter (Signed)
Done

## 2019-06-20 ENCOUNTER — Other Ambulatory Visit: Payer: Self-pay | Admitting: Family Medicine

## 2019-06-20 DIAGNOSIS — D5700 Hb-SS disease with crisis, unspecified: Secondary | ICD-10-CM

## 2019-06-20 DIAGNOSIS — Z79899 Other long term (current) drug therapy: Secondary | ICD-10-CM

## 2019-06-20 DIAGNOSIS — G894 Chronic pain syndrome: Secondary | ICD-10-CM

## 2019-06-20 MED ORDER — TIZANIDINE HCL 4 MG PO CAPS: 4.0000 mg | ORAL_CAPSULE | Freq: Three times a day (TID) | ORAL | 3 refills | Status: DC

## 2019-06-26 ENCOUNTER — Encounter (HOSPITAL_COMMUNITY): Payer: Self-pay | Admitting: Internal Medicine

## 2019-06-26 ENCOUNTER — Other Ambulatory Visit: Payer: Self-pay

## 2019-06-26 ENCOUNTER — Inpatient Hospital Stay (HOSPITAL_COMMUNITY)
Admission: EM | Admit: 2019-06-26 | Discharge: 2019-07-01 | DRG: 812 | Disposition: A | Discharge status: Home / Self Care | Payer: Medicaid Other | Attending: Internal Medicine | Admitting: Internal Medicine

## 2019-06-26 DIAGNOSIS — F1120 Opioid dependence, uncomplicated: Secondary | ICD-10-CM | POA: Diagnosis present

## 2019-06-26 DIAGNOSIS — F419 Anxiety disorder, unspecified: Secondary | ICD-10-CM | POA: Diagnosis present

## 2019-06-26 DIAGNOSIS — F329 Major depressive disorder, single episode, unspecified: Secondary | ICD-10-CM | POA: Diagnosis present

## 2019-06-26 DIAGNOSIS — Z20822 Contact with and (suspected) exposure to covid-19: Secondary | ICD-10-CM | POA: Diagnosis not present

## 2019-06-26 DIAGNOSIS — D638 Anemia in other chronic diseases classified elsewhere: Secondary | ICD-10-CM | POA: Diagnosis present

## 2019-06-26 DIAGNOSIS — Z6841 Body Mass Index (BMI) 40.0 and over, adult: Secondary | ICD-10-CM

## 2019-06-26 DIAGNOSIS — E559 Vitamin D deficiency, unspecified: Secondary | ICD-10-CM | POA: Diagnosis present

## 2019-06-26 DIAGNOSIS — Z832 Family history of diseases of the blood and blood-forming organs and certain disorders involving the immune mechanism: Secondary | ICD-10-CM | POA: Diagnosis not present

## 2019-06-26 DIAGNOSIS — Z79899 Other long term (current) drug therapy: Secondary | ICD-10-CM

## 2019-06-26 DIAGNOSIS — E538 Deficiency of other specified B group vitamins: Secondary | ICD-10-CM | POA: Diagnosis present

## 2019-06-26 DIAGNOSIS — G894 Chronic pain syndrome: Secondary | ICD-10-CM | POA: Diagnosis not present

## 2019-06-26 DIAGNOSIS — R110 Nausea: Secondary | ICD-10-CM | POA: Diagnosis present

## 2019-06-26 DIAGNOSIS — D5700 Hb-SS disease with crisis, unspecified: Secondary | ICD-10-CM | POA: Diagnosis not present

## 2019-06-26 DIAGNOSIS — Z79891 Long term (current) use of opiate analgesic: Secondary | ICD-10-CM

## 2019-06-26 LAB — CBC WITH DIFFERENTIAL/PLATELET
Abs Immature Granulocytes: 0.04 10*3/uL (ref 0.00–0.07)
Basophils Absolute: 0 10*3/uL (ref 0.0–0.1)
Basophils Relative: 0 %
Eosinophils Absolute: 0.1 10*3/uL (ref 0.0–0.5)
Eosinophils Relative: 1 %
HCT: 36.7 % (ref 36.0–46.0)
Hemoglobin: 12.5 g/dL (ref 12.0–15.0)
Immature Granulocytes: 0 %
Lymphocytes Relative: 26 %
Lymphs Abs: 2.3 10*3/uL (ref 0.7–4.0)
MCH: 24.6 pg — ABNORMAL LOW (ref 26.0–34.0)
MCHC: 34.1 g/dL (ref 30.0–36.0)
MCV: 72.1 fL — ABNORMAL LOW (ref 80.0–100.0)
Monocytes Absolute: 0.6 10*3/uL (ref 0.1–1.0)
Monocytes Relative: 6 %
Neutro Abs: 6 10*3/uL (ref 1.7–7.7)
Neutrophils Relative %: 67 %
Platelets: 282 10*3/uL (ref 150–400)
RBC: 5.09 MIL/uL (ref 3.87–5.11)
RDW: 13.7 % (ref 11.5–15.5)
WBC: 9 10*3/uL (ref 4.0–10.5)
nRBC: 0 % (ref 0.0–0.2)

## 2019-06-26 LAB — COMPREHENSIVE METABOLIC PANEL
ALT: 17 U/L (ref 0–44)
AST: 16 U/L (ref 15–41)
Albumin: 4.1 g/dL (ref 3.5–5.0)
Alkaline Phosphatase: 79 U/L (ref 38–126)
Anion gap: 9 (ref 5–15)
BUN: 13 mg/dL (ref 6–20)
CO2: 23 mmol/L (ref 22–32)
Calcium: 9.1 mg/dL (ref 8.9–10.3)
Chloride: 107 mmol/L (ref 98–111)
Creatinine, Ser: 0.56 mg/dL (ref 0.44–1.00)
GFR calc Af Amer: >60 mL/min (ref >60)
GFR calc non Af Amer: >60 mL/min (ref >60)
Glucose, Bld: 103 mg/dL — ABNORMAL HIGH (ref 70–99)
Potassium: 3.9 mmol/L (ref 3.5–5.1)
Sodium: 139 mmol/L (ref 135–145)
Total Bilirubin: 0.8 mg/dL (ref 0.3–1.2)
Total Protein: 7.6 g/dL (ref 6.5–8.1)

## 2019-06-26 LAB — I-STAT BETA HCG BLOOD, ED (MC, WL, AP ONLY): I-stat hCG, quantitative: <5 m[IU]/mL (ref <5)

## 2019-06-26 LAB — SARS CORONAVIRUS 2 BY RT PCR (HOSPITAL ORDER, PERFORMED IN ~~LOC~~ HOSPITAL LAB): SARS Coronavirus 2: NEGATIVE

## 2019-06-26 LAB — RETICULOCYTES
Immature Retic Fract: 26.8 % — ABNORMAL HIGH (ref 2.3–15.9)
RBC.: 5 MIL/uL (ref 3.87–5.11)
Retic Count, Absolute: 172.5 10*3/uL (ref 19.0–186.0)
Retic Ct Pct: 3.5 % — ABNORMAL HIGH (ref 0.4–3.1)

## 2019-06-26 MED ORDER — DIPHENHYDRAMINE HCL 25 MG PO CAPS
25.0000 mg | ORAL_CAPSULE | ORAL | Status: DC | PRN
  Filled 2019-06-26: qty 1

## 2019-06-26 MED ORDER — FOLIC ACID 1 MG PO TABS
1.0000 mg | ORAL_TABLET | Freq: Every day | ORAL | Status: DC
  Administered 2019-06-26 – 2019-07-01 (×6): 1 mg via ORAL
  Filled 2019-06-26 (×7): qty 1

## 2019-06-26 MED ORDER — VITAMIN D (ERGOCALCIFEROL) 1.25 MG (50000 UNIT) PO CAPS
50000.0000 [IU] | ORAL_CAPSULE | ORAL | Status: DC
  Administered 2019-06-27: 50000 [IU] via ORAL
  Filled 2019-06-26: qty 1

## 2019-06-26 MED ORDER — ENOXAPARIN SODIUM 40 MG/0.4ML ~~LOC~~ SOLN
40.0000 mg | SUBCUTANEOUS | Status: DC
  Administered 2019-06-26 – 2019-06-30 (×5): 40 mg via SUBCUTANEOUS
  Filled 2019-06-26 (×6): qty 0.4

## 2019-06-26 MED ORDER — HYDROMORPHONE HCL 2 MG/ML IJ SOLN
2.0000 mg | INTRAMUSCULAR | Status: AC
  Administered 2019-06-26: 2 mg via INTRAVENOUS
  Filled 2019-06-26: qty 1

## 2019-06-26 MED ORDER — POLYETHYLENE GLYCOL 3350 17 G PO PACK: 17.0000 g | PACK | Freq: Every day | ORAL | Status: DC | PRN

## 2019-06-26 MED ORDER — SODIUM CHLORIDE 0.9 % IV BOLUS: 1000.0000 mL | Freq: Once | INTRAVENOUS | Status: DC

## 2019-06-26 MED ORDER — OXYCODONE HCL ER 15 MG PO T12A
15.0000 mg | EXTENDED_RELEASE_TABLET | Freq: Two times a day (BID) | ORAL | Status: DC
  Administered 2019-06-26 – 2019-07-01 (×10): 15 mg via ORAL
  Filled 2019-06-26 (×10): qty 1

## 2019-06-26 MED ORDER — HYDROMORPHONE 1 MG/ML IV SOLN
INTRAVENOUS | Status: DC
  Administered 2019-06-26: 30 mg via INTRAVENOUS
  Administered 2019-06-26: 7.5 mg via INTRAVENOUS
  Administered 2019-06-27: 5.5 mg via INTRAVENOUS
  Administered 2019-06-27: 3.5 mg via INTRAVENOUS
  Administered 2019-06-27: 4.5 mg via INTRAVENOUS
  Administered 2019-06-27: 30 mg via INTRAVENOUS
  Administered 2019-06-27 – 2019-06-28 (×2): 6.5 mg via INTRAVENOUS
  Administered 2019-06-28: 2 mg via INTRAVENOUS
  Administered 2019-06-28: 4 mg via INTRAVENOUS
  Administered 2019-06-28: 6 mg via INTRAVENOUS
  Administered 2019-06-28 – 2019-06-29 (×2): 30 mg via INTRAVENOUS
  Administered 2019-06-29: 9.5 mg via INTRAVENOUS
  Administered 2019-06-29: 6.5 mg via INTRAVENOUS
  Filled 2019-06-26 (×4): qty 30

## 2019-06-26 MED ORDER — PROMETHAZINE HCL 25 MG/ML IJ SOLN
12.5000 mg | Freq: Once | INTRAMUSCULAR | Status: AC
  Administered 2019-06-26: 12.5 mg via INTRAVENOUS
  Filled 2019-06-26: qty 1

## 2019-06-26 MED ORDER — KETOROLAC TROMETHAMINE 30 MG/ML IJ SOLN
30.0000 mg | Freq: Four times a day (QID) | INTRAMUSCULAR | Status: AC
  Administered 2019-06-26 – 2019-07-01 (×20): 30 mg via INTRAVENOUS
  Filled 2019-06-26 (×19): qty 1

## 2019-06-26 MED ORDER — DIPHENHYDRAMINE HCL 25 MG PO CAPS
25.0000 mg | ORAL_CAPSULE | ORAL | Status: DC | PRN
  Administered 2019-06-26: 50 mg via ORAL
  Filled 2019-06-26: qty 2

## 2019-06-26 MED ORDER — NALOXONE HCL 0.4 MG/ML IJ SOLN: 0.4000 mg | INTRAMUSCULAR | Status: DC | PRN

## 2019-06-26 MED ORDER — VITAMIN B-12 100 MCG PO TABS
50.0000 ug | ORAL_TABLET | Freq: Every day | ORAL | Status: DC
  Administered 2019-06-27 – 2019-07-01 (×5): 50 ug via ORAL
  Filled 2019-06-26 (×5): qty 1

## 2019-06-26 MED ORDER — VOXELOTOR 500 MG PO TABS: 1500.0000 mg | ORAL_TABLET | Freq: Every day | ORAL | Status: DC

## 2019-06-26 MED ORDER — DEXTROSE-NACL 5-0.45 % IV SOLN: INTRAVENOUS | Status: DC

## 2019-06-26 MED ORDER — DOCUSATE SODIUM 100 MG PO CAPS: 100.0000 mg | ORAL_CAPSULE | Freq: Two times a day (BID) | ORAL | Status: DC | PRN

## 2019-06-26 MED ORDER — SENNOSIDES-DOCUSATE SODIUM 8.6-50 MG PO TABS
1.0000 | ORAL_TABLET | Freq: Two times a day (BID) | ORAL | Status: DC
  Administered 2019-06-27 – 2019-07-01 (×9): 1 via ORAL
  Filled 2019-06-26 (×10): qty 1

## 2019-06-26 MED ORDER — IBUPROFEN 800 MG PO TABS: 800.0000 mg | ORAL_TABLET | Freq: Three times a day (TID) | ORAL | Status: DC | PRN

## 2019-06-26 MED ORDER — HYDROMORPHONE HCL 2 MG/ML IJ SOLN
2.0000 mg | Freq: Once | INTRAMUSCULAR | Status: AC
  Administered 2019-06-26: 2 mg via INTRAVENOUS
  Filled 2019-06-26: qty 1

## 2019-06-26 MED ORDER — TIZANIDINE HCL 4 MG PO TABS
4.0000 mg | ORAL_TABLET | Freq: Three times a day (TID) | ORAL | Status: DC
  Administered 2019-06-26 – 2019-07-01 (×14): 4 mg via ORAL
  Filled 2019-06-26 (×14): qty 1

## 2019-06-26 MED ORDER — SODIUM CHLORIDE 0.9 % IV SOLN
25.0000 mg | INTRAVENOUS | Status: DC | PRN
  Filled 2019-06-26: qty 0.5

## 2019-06-26 MED ORDER — KETOROLAC TROMETHAMINE 15 MG/ML IJ SOLN
15.0000 mg | Freq: Once | INTRAMUSCULAR | Status: AC
  Administered 2019-06-26: 15 mg via INTRAVENOUS
  Filled 2019-06-26: qty 1

## 2019-06-26 MED ORDER — OXYCODONE ER 13.5 MG PO C12A: 13.5000 mg | EXTENDED_RELEASE_CAPSULE | Freq: Two times a day (BID) | ORAL | Status: DC

## 2019-06-26 MED ORDER — HYDROXYUREA 500 MG PO CAPS
2000.0000 mg | ORAL_CAPSULE | Freq: Every day | ORAL | Status: DC
  Administered 2019-06-27 – 2019-07-01 (×5): 2000 mg via ORAL
  Filled 2019-06-26 (×5): qty 4

## 2019-06-26 MED ORDER — SODIUM CHLORIDE 0.45 % IV SOLN: INTRAVENOUS | Status: DC

## 2019-06-26 MED ORDER — NALOXONE HCL 0.4 MG/ML IJ SOLN: 0.2000 mg | INTRAMUSCULAR | Status: DC | PRN

## 2019-06-26 MED ORDER — SODIUM CHLORIDE 0.9% FLUSH: 9.0000 mL | INTRAVENOUS | Status: DC | PRN

## 2019-06-26 MED ORDER — ONDANSETRON HCL 4 MG/2ML IJ SOLN
4.0000 mg | INTRAMUSCULAR | Status: DC | PRN
  Administered 2019-06-26: 4 mg via INTRAVENOUS
  Filled 2019-06-26: qty 2

## 2019-06-26 MED ORDER — ONDANSETRON HCL 4 MG/2ML IJ SOLN
4.0000 mg | Freq: Four times a day (QID) | INTRAMUSCULAR | Status: DC | PRN
  Administered 2019-06-27 – 2019-06-29 (×5): 4 mg via INTRAVENOUS
  Filled 2019-06-26 (×5): qty 2

## 2019-06-26 NOTE — ED Notes (Signed)
Report called for pt transfer to the floor  

## 2019-06-26 NOTE — ED Triage Notes (Signed)
Patient reports sickle cell pain in bilateral legs and left arm starting 2 days ago. Home medications not effective.

## 2019-06-26 NOTE — ED Provider Notes (Signed)
[facility]-EMERGENCY DEPT Provider Note   CSN: [id number] Arrival date & time: 06/26/19  1058     History Chief Complaint  Patient presents with  . Sickle Cell Pain Crisis    Stephanie Burch is a 39 y.o. female with a past medical history of sickle cell anemia, chronic pain syndrome, chronic use of opioids, morbid obesity, who presents today for evaluation of a sickle cell pain crisis.  She reports that her symptoms started 2 days ago.  She tried her home medicines however states that they have not been working.  She denies any fevers.  Her pain is in her bilateral legs and her left arm.  She states that her sickle cell pain is usually in her bilateral legs, and over the past few months has also been in her left arm.  She denies any fevers.  No chest pain, back pain, abdominal pain.  No nausea vomiting diarrhea.  No shortness of breath.  She denies dehydration.  She is unable to identify a possible instigating event.  No changes to her leg pain.  No changes to bowel/bladder function.   HPI     Past Medical History:  Diagnosis Date  . Nausea   . Sickle cell anemia (HCC)   . Vitamin B12 deficiency 12/2018  . Vitamin D deficiency     Patient Active Problem List   Diagnosis Date Noted  . Urinary frequency 04/20/2019  . Vaginal odor 04/20/2019  . Chest pain varying with breathing   . Fever and chills   . Anxiety 11/15/2018  . Vitamin D deficiency 11/15/2018  . Nausea 11/15/2018  . Abnormal pulse oximetry   . Hypokalemia 09/20/2018  . Medication management 09/14/2018  . Muscle spasms of both lower extremities 08/25/2018  . Subjective visual disturbance of right eye 08/25/2018  . 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 Chronic pain syndrome 03/16/2018  . Chronic, continuous use of opioids 03/16/2018  . Tachycardia with heart rate 100-120 beats per minute   . Sickle cell anemia with crisis (HCC) 10/19/2017  . Leukocytosis 10/19/2017  .  Thrombocytopenia (HCC) 10/19/2017  . Morbidly obese (HCC) 10/19/2017  . Sickle cell pain crisis (HCC) 10/15/2017    No past surgical history on file.   OB History    Gravida  3   Para      Term      Preterm      AB      Living  3     SAB      TAB      Ectopic      Multiple      Live Births              Family History  Problem Relation Age of Onset  . Hypertension Mother   . Sickle cell trait Mother   . Diabetes Father   . Sickle cell trait Father     Social History   Tobacco Use  . Smoking status: Never Smoker  . Smokeless tobacco: Never Used  Substance Use Topics  . Alcohol use: Not Currently  . Drug use: Never    Home Medications Prior to Admission medications   Medication Sig Start Date End Date Taking? Authorizing Provider  docusate sodium (COLACE) 100 MG capsule Take 1 capsule (100 mg total) by mouth 2 (two) times daily. Patient taking differently: Take 100 mg by mouth 2 (two) times daily as needed for mild constipation.  09/10/18  Yes Kallie Locks, FNP  folic acid (FOLVITE) 1 MG tablet Take 1 tablet (1 mg total) by mouth daily. 11/15/18  Yes Kallie Locks, FNP  hydroxyurea (HYDREA) 500 MG capsule Take 4 capsules (2,000 mg total) by mouth daily. May take with food to minimize GI side effects. 11/15/18  Yes Kallie Locks, FNP  ibuprofen (ADVIL) 800 MG tablet TAKE 1 TABLET (800 MG TOTAL) BY MOUTH EVERY 8 (EIGHT) HOURS AS NEEDED FOR HEADACHE, MILD PAIN, MODERATE PAIN OR CRAMPING. 06/09/19  Yes Kallie Locks, FNP  naloxone [facility]) 0.4 MG/ML injection As needed 09/14/18  Yes Kallie Locks, FNP  oxyCODONE ER [facility] ER) 13.5 MG C12A Take 13.5 mg by mouth every 12 (twelve) hours. 06/09/19 07/09/19 Yes Kallie Locks, FNP  Oxycodone HCl 20 MG TABS Take 1 tablet (20 mg total) by mouth every 4 (four) hours as needed (for pain). 06/17/19  Yes Kallie Locks, FNP  tiZANidine (ZANAFLEX) 4 MG capsule Take 1 capsule (4 mg total) by mouth 3  (three) times daily. 06/26/19 10/24/19 Yes Kallie Locks, FNP  vitamin B-12 (CYANOCOBALAMIN) 50 MCG tablet Take 1 tablet (50 mcg total) by mouth daily. 12/21/18  Yes Kallie Locks, FNP  Vitamin D, Ergocalciferol, (DRISDOL) 1.25 MG (50000 UT) CAPS capsule Take 1 capsule (50,000 Units total) by mouth every 7 (seven) days. 12/21/18  Yes Kallie Locks, FNP  voxelotor (OXBRYTA) 500 MG TABS tablet Take 1,500 mg by mouth daily. For Sickle Cell Anemia   Yes [provider]  DULoxetine (CYMBALTA) 20 MG capsule TAKE 1 CAPSULE BY MOUTH EVERY DAY Patient not taking: No sig reported 10/25/18   Kallie Locks, FNP  ondansetron (ZOFRAN) 4 MG tablet TAKE 1 TABLET BY MOUTH EVERY 8 HOURS AS NEEDED FOR NAUSEA AND VOMITING Patient not taking: Reported on 06/26/2019 06/09/19   Kallie Locks, FNP  prochlorperazine (COMPAZINE) 10 MG tablet Take 1 tablet (10 mg total) by mouth every 6 (six) hours as needed for nausea or vomiting. Take if Ondansetron is not effective. Patient not taking: Reported on 06/26/2019 11/15/18   Kallie Locks, FNP    Allergies    Patient has no known allergies.  Review of Systems   Review of Systems  Constitutional: Negative for chills and fever.  HENT: Negative for congestion.   Respiratory: Negative for shortness of breath.   Musculoskeletal: Negative for back pain.       Pain in left arm, bilateral legs.   Neurological: Negative for headaches.  All other systems reviewed and are negative.   Physical Exam Updated Vital Signs BP 131/90   Pulse 87   Temp 98.6 F (37 C) (Oral)   Resp 16   Ht 5\' 6"  (1.676 m)   Wt 120.2 kg   LMP 06/07/2019   SpO2 100%   BMI 42.77 kg/m   Physical Exam Vitals and nursing note reviewed.  Constitutional:      General: She is not in acute distress.    Appearance: She is well-developed. She is not diaphoretic.     Comments: Tearful  HENT:     Head: Normocephalic and atraumatic.  Eyes:     General: No scleral icterus.        Right eye: No discharge.        Left eye: No discharge.     Conjunctiva/sclera: Conjunctivae normal.  Cardiovascular:     Rate and Rhythm: Normal rate and regular rhythm.     Pulses: Normal pulses.     Heart sounds:  Normal heart sounds.  Pulmonary:     Effort: Pulmonary effort is normal. No respiratory distress.     Breath sounds: Normal breath sounds. No stridor.  Abdominal:     General: There is no distension.     Tenderness: There is no abdominal tenderness.  Musculoskeletal:        General: No deformity.     Cervical back: Normal range of motion and neck supple.     Right lower leg: No edema.     Left lower leg: No edema.  Skin:    General: Skin is warm and dry.  Neurological:     General: No focal deficit present.     Mental Status: She is alert and oriented to person, place, and time.     Motor: No abnormal muscle tone.  Psychiatric:     Comments: Tearful     ED Results / Procedures / Treatments   Labs (all labs ordered are listed, but only abnormal results are displayed) Labs Reviewed  CBC WITH DIFFERENTIAL/PLATELET - Abnormal; Notable for the following components:      Result Value   MCV 72.1 (*)    MCH 24.6 (*)    All other components within normal limits  COMPREHENSIVE METABOLIC PANEL - Abnormal; Notable for the following components:   Glucose, Bld 103 (*)    All other components within normal limits  RETICULOCYTES - Abnormal; Notable for the following components:   Retic Ct Pct 3.5 (*)    Immature Retic Fract 26.8 (*)    All other components within normal limits  URINALYSIS, ROUTINE W REFLEX MICROSCOPIC - Abnormal; Notable for the following components:   APPearance CLOUDY (*)    Hgb urine dipstick MODERATE (*)    Protein, ur 100 (*)    Leukocytes,Ua LARGE (*)    WBC, UA >50 (*)    All other components within normal limits  URINE CULTURE  SARS CORONAVIRUS 2 BY RT PCR (HOSPITAL ORDER, [facility])  I-STAT BETA HCG BLOOD,  ED ([facility], [facility], [facility] ONLY)    EKG None  Radiology No results found.  Procedures .Critical Care Performed by: Lorin Glass, PA-C Authorized by: Lorin Glass, PA-C   Critical care provider statement:    Critical care time (minutes):  45   Critical care was time spent personally by me on the following activities:  Discussions with consultants, evaluation of patient's response to treatment, examination of patient, ordering and performing treatments and interventions, ordering and review of laboratory studies, ordering and review of radiographic studies, pulse oximetry, re-evaluation of patient's condition, obtaining history from patient or surrogate and review of old charts   (including critical care time)  Medications Ordered in ED Medications  ondansetron (ZOFRAN) injection 4 mg (4 mg Intravenous Given 06/26/19 1305)  diphenhydrAMINE (BENADRYL) capsule 25-50 mg (50 mg Oral Given 06/26/19 1319)  0.45 % sodium chloride infusion ( Intravenous New Bag/Given 06/26/19 1305)  promethazine (PHENERGAN) injection 12.5 mg (has no administration in time range)  HYDROmorphone (DILAUDID) injection 2 mg (2 mg Intravenous Given 06/26/19 1305)  HYDROmorphone (DILAUDID) injection 2 mg (2 mg Intravenous Given 06/26/19 1343)  ketorolac (TORADOL) 15 MG/ML injection 15 mg (15 mg Intravenous Given 06/26/19 1412)  HYDROmorphone (DILAUDID) injection 2 mg (2 mg Intravenous Given 06/26/19 1556)    ED Course  I have reviewed the triage vital signs and the nursing notes.  Pertinent labs & imaging results that were available during my care of the patient were  reviewed by me and considered in my medical decision making (see chart for details).    MDM Rules/Calculators/A&P                     Patient is a 39 year old woman with a past medical history of sickle cell who presents today for evaluation of a sickle cell pain crisis worsening over the past 2 days.  Her home oral pain medications have been  ineffective.  Her pain is in her usual sickle cell locations in her left arm and her bilateral legs although the left arm pain is new as of 1 month ago.  No chest pain or shortness of breath.  No fevers.  She denies dysuria, increased frequency or urgency.  Based on her home pain regimen she is classified is opioid tolerant.  She is given 3 rounds of IV Dilaudid while in the emergency room, IV Toradol, IV Benadryl, and IV Zofran and still remains at a 8 out of 10 pain.    Labs are obtained and reviewed, hemoglobin is consistent with her hemoglobin 3 weeks ago and is normal.  She does not have a significant leukocytosis or leukopenia.  Recheck percent is elevated at 3.5.  Pregnancy test is negative, Covid testing is sent.  She is hemodynamically stable, at 100% on room air.  Patient will require admission for uncontrolled pain. I spoke with Dr. Mikeal Hawthorne who is on for sickle cell who will see patient.   Note: Portions of this report may have been transcribed using voice recognition software. Every effort was made to ensure accuracy; however, inadvertent computerized transcription errors may be present   Final Clinical Impression(s) / ED Diagnoses Final diagnoses:  Sickle cell pain crisis [facility])    Rx / DC Orders ED Discharge Orders    None       Norman Clay 06/26/19 2049    Sabas Sous, MD 07/01/19 2320

## 2019-06-26 NOTE — H&P (Signed)
Stephanie Burch is an 39 y.o. female.   Chief Complaint: Pain in legs and back for 2 days  HPI: Patient is a 39 year old female with known history of sickle cell disease who presents to the ER with sickle cell painful crisis.  Patient has chronic pain and opiate tolerant with long-acting morphine as well as short acting.  She came to the ER due to worsening pain in her legs and back.  Pain was not controlled with home regimen.  She is taken it and not getting adequate relief.  She was seen in the ER got up to 6 mg of IV Dilaudid with no relief.  She denied any fever or chills no nausea vomiting or diarrhea.  Patient therefore is being admitted to the hospital with acute sickle cell crisis that has failed outpatient treatment.  Past Medical History:  Diagnosis Date  . Nausea   . Sickle cell anemia (HCC)   . Vitamin B12 deficiency 12/2018  . Vitamin D deficiency     No past surgical history on file.  Family History  Problem Relation Age of Onset  . Hypertension Mother   . Sickle cell trait Mother   . Diabetes Father   . Sickle cell trait Father    Social History:  reports that she has never smoked. She has never used smokeless tobacco. She reports previous alcohol use. She reports that she does not use drugs.  Allergies: No Known Allergies  (Not in a hospital admission)   Results for orders placed or performed during the hospital encounter of 06/26/19 (from the past 48 hour(s))  Urinalysis, Routine w reflex microscopic     Status: Abnormal   Collection Time: 06/26/19 12:29 PM  Result Value Ref Range   Color, Urine YELLOW YELLOW   APPearance CLOUDY (A) CLEAR   Specific Gravity, Urine 1.024 1.005 - 1.030   pH 5.0 5.0 - 8.0   Glucose, UA NEGATIVE NEGATIVE mg/dL   Hgb urine dipstick MODERATE (A) NEGATIVE   Bilirubin Urine NEGATIVE NEGATIVE   Ketones, ur NEGATIVE NEGATIVE mg/dL   Protein, ur 161 (A) NEGATIVE mg/dL   Nitrite NEGATIVE NEGATIVE   Leukocytes,Ua LARGE (A) NEGATIVE   RBC  / HPF 6-10 0 - 5 RBC/hpf   WBC, UA >50 (H) 0 - 5 WBC/hpf   Bacteria, UA NONE SEEN NONE SEEN   Squamous Epithelial / LPF 0-5 0 - 5   WBC Clumps PRESENT     Comment: Performed at [facility], [address], [place], [place] [address]  CBC WITH DIFFERENTIAL     Status: Abnormal   Collection Time: 06/26/19 12:47 PM  Result Value Ref Range   WBC 9.0 4.0 - 10.5 K/uL   RBC 5.09 3.87 - 5.11 MIL/uL   Hemoglobin 12.5 12.0 - 15.0 g/dL   HCT 54.0 98.1 - 19.1 %   MCV 72.1 (L) 80.0 - 100.0 fL   MCH 24.6 (L) 26.0 - 34.0 pg   MCHC 34.1 30.0 - 36.0 g/dL   RDW 47.8 29.5 - 62.1 %   Platelets 282 150 - 400 K/uL   nRBC 0.0 0.0 - 0.2 %   Neutrophils Relative % 67 %   Neutro Abs 6.0 1.7 - 7.7 K/uL   Lymphocytes Relative 26 %   Lymphs Abs 2.3 0.7 - 4.0 K/uL   Monocytes Relative 6 %   Monocytes Absolute 0.6 0.1 - 1.0 K/uL   Eosinophils Relative 1 %   Eosinophils Absolute 0.1 0.0 - 0.5 K/uL  Basophils Relative 0 %   Basophils Absolute 0.0 0.0 - 0.1 K/uL   Immature Granulocytes 0 %   Abs Immature Granulocytes 0.04 0.00 - 0.07 K/uL    Comment: Performed at [facility], [address] [place], [place] [address]  Comprehensive metabolic panel     Status: Abnormal   Collection Time: 06/26/19 12:47 PM  Result Value Ref Range   Sodium 139 135 - 145 mmol/L   Potassium 3.9 3.5 - 5.1 mmol/L   Chloride 107 98 - 111 mmol/L   CO2 23 22 - 32 mmol/L   Glucose, Bld 103 (H) 70 - 99 mg/dL    Comment: Glucose reference range applies only to samples taken after fasting for at least 8 hours.   BUN 13 6 - 20 mg/dL   Creatinine, Ser 3.88 0.44 - 1.00 mg/dL   Calcium 9.1 8.9 - 82.8 mg/dL   Total Protein 7.6 6.5 - 8.1 g/dL   Albumin 4.1 3.5 - 5.0 g/dL   AST 16 15 - 41 U/L   ALT 17 0 - 44 U/L   Alkaline Phosphatase 79 38 - 126 U/L   Total Bilirubin 0.8 0.3 - 1.2 mg/dL   GFR calc non Af Amer >60 >60 mL/min   GFR calc Af Amer >60 >60 mL/min   Anion gap 9 5 - 15    Comment:  Performed at [facility], [address], [place], [place] [address]  Reticulocytes     Status: Abnormal   Collection Time: 06/26/19 12:47 PM  Result Value Ref Range   Retic Ct Pct 3.5 (H) 0.4 - 3.1 %   RBC. 5.00 3.87 - 5.11 MIL/uL   Retic Count, Absolute 172.5 19.0 - 186.0 K/uL   Immature Retic Fract 26.8 (H) 2.3 - 15.9 %    Comment: Performed at [facility], [address], [place], [place] [address]  I-Stat beta hCG blood, ED     Status: None   Collection Time: 06/26/19  1:35 PM  Result Value Ref Range   I-stat hCG, quantitative <5.0 <5 mIU/mL   Comment 3            Comment:   GEST. AGE      CONC.  (mIU/mL)   <=1 WEEK        5 - 50     2 WEEKS       50 - 500     3 WEEKS       100 - 10,000     4 WEEKS     1,000 - 30,000        FEMALE AND NON-PREGNANT FEMALE:     LESS THAN 5 mIU/mL    No results found.  Review of Systems  Constitutional: Negative.   HENT: Negative.   Eyes: Negative.   Respiratory: Negative.   Cardiovascular: Negative.   Gastrointestinal: Negative.   Endocrine: Negative.   Genitourinary: Negative.   Musculoskeletal: Positive for arthralgias, joint swelling and myalgias.  Skin: Negative.   Allergic/Immunologic: Negative.   Neurological: Negative.   Hematological: Negative.   Psychiatric/Behavioral: Negative.     Blood pressure 131/90, pulse 87, temperature 98.6 F (37 C), temperature source Oral, resp. rate 16, height 5\' 6"  (1.676 m), weight 120.2 kg, last menstrual period 06/07/2019, SpO2 100 %. Physical Exam  Constitutional: She is oriented to person, place, and time. She appears well-developed and well-nourished.  HENT:  Head: Normocephalic and atraumatic.  Eyes: Pupils are equal, round, and  reactive to light. Conjunctivae and EOM are normal.  Cardiovascular: Normal rate, regular rhythm and normal heart sounds.  Respiratory: Effort normal and breath sounds normal.  GI: Soft. Bowel sounds are normal.   Musculoskeletal:        General: Tenderness present. Normal range of motion.     Cervical back: Normal range of motion and neck supple.  Neurological: She is alert and oriented to person, place, and time. She has normal reflexes.  Skin: Skin is warm and dry.  Psychiatric: She has a normal mood and affect.     Assessment/Plan This is a 39 year old with sickle cell painful crisis.  #1 sickle cell painful crisis: Patient will be admitted and started on Dilaudid PCA Toradol and IV fluids with D5 half-normal saline at 125.  Also continue long-acting pain medications and monitor.  Pain assessment on a regular basis.  #2 anemia of chronic disease: Secondary to sickle cell.  Monitor H&H especially after hydration.  #3 chronic pain syndrome: Continue long-acting pain medications in addition to hospital care.  #4 possible UTI:: Afebrile, patient not symptomatic.  She has large leukocytes but no bacteria Monds count more than 50.  Probably asymptomatic bacteriuria.  Initiate antibiotics if patient spikes a fever or becomes symptomatic.  Barbette Merino, MD 06/26/2019, 4:28 PM

## 2019-06-27 DIAGNOSIS — D5700 Hb-SS disease with crisis, unspecified: Principal | ICD-10-CM

## 2019-06-27 LAB — COMPREHENSIVE METABOLIC PANEL
ALT: 14 U/L (ref 0–44)
AST: 16 U/L (ref 15–41)
Albumin: 3.4 g/dL — ABNORMAL LOW (ref 3.5–5.0)
Alkaline Phosphatase: 64 U/L (ref 38–126)
Anion gap: 8 (ref 5–15)
BUN: 17 mg/dL (ref 6–20)
CO2: 23 mmol/L (ref 22–32)
Calcium: 8.4 mg/dL — ABNORMAL LOW (ref 8.9–10.3)
Chloride: 108 mmol/L (ref 98–111)
Creatinine, Ser: 0.85 mg/dL (ref 0.44–1.00)
GFR calc Af Amer: >60 mL/min (ref >60)
GFR calc non Af Amer: >60 mL/min (ref >60)
Glucose, Bld: 109 mg/dL — ABNORMAL HIGH (ref 70–99)
Potassium: 3.9 mmol/L (ref 3.5–5.1)
Sodium: 139 mmol/L (ref 135–145)
Total Bilirubin: 0.7 mg/dL (ref 0.3–1.2)
Total Protein: 6.5 g/dL (ref 6.5–8.1)

## 2019-06-27 LAB — CBC WITH DIFFERENTIAL/PLATELET
Abs Immature Granulocytes: 0.06 10*3/uL (ref 0.00–0.07)
Basophils Absolute: 0 10*3/uL (ref 0.0–0.1)
Basophils Relative: 0 %
Eosinophils Absolute: 0.3 10*3/uL (ref 0.0–0.5)
Eosinophils Relative: 3 %
HCT: 30.8 % — ABNORMAL LOW (ref 36.0–46.0)
Hemoglobin: 10.8 g/dL — ABNORMAL LOW (ref 12.0–15.0)
Immature Granulocytes: 1 %
Lymphocytes Relative: 31 %
Lymphs Abs: 3.3 10*3/uL (ref 0.7–4.0)
MCH: 25 pg — ABNORMAL LOW (ref 26.0–34.0)
MCHC: 35.1 g/dL (ref 30.0–36.0)
MCV: 71.3 fL — ABNORMAL LOW (ref 80.0–100.0)
Monocytes Absolute: 1 10*3/uL (ref 0.1–1.0)
Monocytes Relative: 9 %
Neutro Abs: 5.9 10*3/uL (ref 1.7–7.7)
Neutrophils Relative %: 56 %
Platelets: 226 10*3/uL (ref 150–400)
RBC: 4.32 MIL/uL (ref 3.87–5.11)
RDW: 13.4 % (ref 11.5–15.5)
WBC: 10.5 10*3/uL (ref 4.0–10.5)
nRBC: 0.3 % — ABNORMAL HIGH (ref 0.0–0.2)

## 2019-06-27 LAB — URINE CULTURE: Culture: <10000 — AB

## 2019-06-27 MED ORDER — IBUPROFEN 800 MG PO TABS: 800.0000 mg | ORAL_TABLET | Freq: Three times a day (TID) | ORAL | Status: DC | PRN

## 2019-06-27 NOTE — Progress Notes (Signed)
Subjective: Stephanie Burch is a 39 year old female with a medical history significant for sickle cell disease, chronic pain syndrome, opiate dependence and tolerance, morbid obesity, depression and anxiety and history of anemia of chronic disease was admitted for sickle cell pain crisis.  Patient states that pain intensity has improved overnight.  She rates pain as 7/10 primarily to bilateral lower extremities.  She has no new complaints on today.  She currently denies headache, chest pain, shortness of breath, urinary symptoms, nausea, vomiting, or diarrhea.  Patient is afebrile and oxygen saturation is 100% on RA.  Objective:  Vital signs in last 24 hours:  Vitals:   06/27/19 0815 06/27/19 1152 06/27/19 1403 06/27/19 1700  BP:   (!) 147/87   Pulse:   78   Resp: 13 17 13 11   Temp:   98.6 F (37 C)   TempSrc:   Oral   SpO2: 98% 100% 100% 99%  Weight:      Height:        Intake/Output from previous day:   Intake/Output Summary (Last 24 hours) at 06/27/2019 1720 Last data filed at 06/27/2019 1631 Gross per 24 hour  Intake 3687.16 ml  Output -  Net 3687.16 ml    Physical Exam: General: Alert, awake, oriented x3, in no acute distress.  HEENT: [place]/AT PEERL, EOMI Neck: Trachea midline,  no masses, no thyromegal,y no JVD, no carotid bruit OROPHARYNX:  Moist, No exudate/ erythema/lesions.  Heart: Regular rate and rhythm, without murmurs, rubs, gallops, PMI non-displaced, no heaves or thrills on palpation.  Lungs: Clear to auscultation, no wheezing or rhonchi noted. No increased vocal fremitus resonant to percussion  Abdomen: Soft, nontender, nondistended, positive bowel sounds, no masses no hepatosplenomegaly noted..  Neuro: No focal neurological deficits noted cranial nerves II through XII grossly intact. DTRs 2+ bilaterally upper and lower extremities. Strength 5 out of 5 in bilateral upper and lower extremities. Musculoskeletal: No warm swelling or erythema around joints, no spinal  tenderness noted. Psychiatric: Patient alert and oriented x3, good insight and cognition, good recent to remote recall. Lymph node survey: No cervical axillary or inguinal lymphadenopathy noted.  Lab Results:  Basic Metabolic Panel:    Component Value Date/Time   NA 139 06/27/2019 0517   NA 142 09/11/2017 1431   K 3.9 06/27/2019 0517   CL 108 06/27/2019 0517   CO2 23 06/27/2019 0517   BUN 17 06/27/2019 0517   BUN 8 09/11/2017 1431   CREATININE 0.85 06/27/2019 0517   GLUCOSE 109 (H) 06/27/2019 0517   CALCIUM 8.4 (L) 06/27/2019 0517   CBC:    Component Value Date/Time   WBC 10.5 06/27/2019 0517   HGB 10.8 (L) 06/27/2019 0517   HGB 13.0 09/11/2017 1431   HCT 30.8 (L) 06/27/2019 0517   HCT 38.4 09/11/2017 1431   PLT 226 06/27/2019 0517   PLT 258 09/11/2017 1431   MCV 71.3 (L) 06/27/2019 0517   MCV 75 (L) 09/11/2017 1431   NEUTROABS 5.9 06/27/2019 0517   NEUTROABS 3.9 09/11/2017 1431   LYMPHSABS 3.3 06/27/2019 0517   LYMPHSABS 2.2 09/11/2017 1431   MONOABS 1.0 06/27/2019 0517   EOSABS 0.3 06/27/2019 0517   EOSABS 0.1 09/11/2017 1431   BASOSABS 0.0 06/27/2019 0517   BASOSABS 0.0 09/11/2017 1431    Recent Results (from the past 240 hour(s))  Urine culture     Status: Abnormal   Collection Time: 06/26/19  3:56 PM   Specimen: Urine, Random  Result Value Ref Range Status  Specimen Description   Final    URINE, RANDOM Performed at [facility], [address] [place], [place] [address]    Special Requests   Final    NONE Performed at [facility], [address] [place], [place] [address]    Culture (A)  Final    <10,000 COLONIES/mL INSIGNIFICANT GROWTH Performed at [facility], [address], [place], [place] [address]    Report Status 06/27/2019 FINAL  Final  SARS Coronavirus 2 by RT PCR (hospital order, performed in [facility] hospital lab) Nasopharyngeal Nasopharyngeal Swab     Status: None   Collection Time:  06/26/19  3:56 PM   Specimen: Nasopharyngeal Swab  Result Value Ref Range Status   SARS Coronavirus 2 NEGATIVE NEGATIVE Final    Comment: (NOTE) SARS-CoV-2 target nucleic acids are NOT DETECTED. The SARS-CoV-2 RNA is generally detectable in upper and lower respiratory specimens during the acute phase of infection. The lowest concentration of SARS-CoV-2 viral copies this assay can detect is 250 copies / mL. A negative result does not preclude SARS-CoV-2 infection and should not be used as the sole basis for treatment or other patient management decisions.  A negative result may occur with improper specimen collection / handling, submission of specimen other than nasopharyngeal swab, presence of viral mutation(s) within the areas targeted by this assay, and inadequate number of viral copies (<250 copies / mL). A negative result must be combined with clinical observations, patient history, and epidemiological information. Fact Sheet for Patients:   BoilerBrush.com.cy Fact Sheet for Healthcare Providers: https://pope.com/ This test is not yet approved or cleared  by the [place] FDA and has been authorized for detection and/or diagnosis of SARS-CoV-2 by FDA under an Emergency Use Authorization (EUA).  This EUA will remain in effect (meaning this test can be used) for the duration of the COVID-19 declaration under Section 564(b)(1) of the Act, 21 U.S.C. section 360bbb-3(b)(1), unless the authorization is terminated or revoked sooner. Performed at [facility], [address] [place], [place] [address]     Studies/Results: No results found.  Medications: Scheduled Meds: . enoxaparin (LOVENOX) injection  40 mg Subcutaneous Q24H  . folic acid  1 mg Oral Daily  . HYDROmorphone   Intravenous Q4H  . hydroxyurea  2,000 mg Oral Daily  . ketorolac  30 mg Intravenous Q6H  . oxyCODONE  15 mg Oral Q12H  .  senna-docusate  1 tablet Oral BID  . tiZANidine  4 mg Oral TID  . vitamin B-12  50 mcg Oral Daily  . Vitamin D (Ergocalciferol)  50,000 Units Oral Q7 days  . voxelotor  1,500 mg Oral Daily   Continuous Infusions: . dextrose 5 % and 0.45% NaCl 125 mL/hr at 06/27/19 0600  . diphenhydrAMINE     PRN Meds:.diphenhydrAMINE **OR** diphenhydrAMINE, docusate sodium, [START ON 07/01/2019] ibuprofen, naloxone **AND** sodium chloride flush, ondansetron (ZOFRAN) IV, polyethylene glycol  Consultants:  None  Procedures:  None  Antibiotics:  None  Assessment/Plan: Active Problems:   Sickle cell anemia with crisis (HCC)  Sickle cell disease with pain crisis: Continue IV fluids D5 0.45% saline at 75 mL/h.  Continue IV Dilaudid PCA, no changes in settings on today. Monitor vital signs closely, reevaluate pain scale regularly, and supplemental oxygen as needed.  Anemia of chronic disease: Stable.  Consistent with patient's baseline.  There is no clinical indication for blood transfusion on today.  Chronic pain syndrome: Continue home medications  History  of depression and anxiety: Stable.  No SI or HI.  Continue home medications.   Code Status: Full Code Family Communication: N/A Disposition Plan: Not yet ready for discharge  Lachina Rennis Petty  APRN, MSN, FNP-C [facility] [address] [place], [place] [address] [phone]  If 5PM-7AM, please contact night-coverage.  06/27/2019, 5:20 PM  LOS: 1 day

## 2019-06-27 NOTE — Progress Notes (Signed)
RN received call from [facility] in lab stated pt's urine specimen was incorrectly processed and new results are normal. Will disregard previous results.

## 2019-06-28 ENCOUNTER — Ambulatory Visit: Payer: Self-pay | Admitting: Family Medicine

## 2019-06-28 DIAGNOSIS — G894 Chronic pain syndrome: Secondary | ICD-10-CM

## 2019-06-28 DIAGNOSIS — F419 Anxiety disorder, unspecified: Secondary | ICD-10-CM

## 2019-06-28 LAB — URINALYSIS, ROUTINE W REFLEX MICROSCOPIC
Bilirubin Urine: NEGATIVE
Glucose, UA: NEGATIVE mg/dL
Hgb urine dipstick: NEGATIVE
Ketones, ur: NEGATIVE mg/dL
Leukocytes,Ua: NEGATIVE
Nitrite: NEGATIVE
Protein, ur: NEGATIVE mg/dL
Specific Gravity, Urine: 1.013 (ref 1.005–1.030)
pH: 6 (ref 5.0–8.0)

## 2019-06-28 NOTE — Progress Notes (Signed)
Subjective: Stephanie Burch is a 39 year old female with a medical history significant for sickle cell disease, chronic pain syndrome, opiate dependence and tolerance, morbid obesity, depression and anxiety and history of anemia of chronic disease was admitted for sickle cell pain crisis.   Patient continues to complain of increased pain. She rates pain as 7-8/10 primarily to lower extremities. She currently denies headache, chest pain, shortness of breath, urinary symptoms, nausea, vomiting, or diarrhea. Patient is afebrile and oxygen saturation is 100% on RA.   Objective:  Vital signs in last 24 hours:  Vitals:   06/28/19 0918 06/28/19 1216 06/28/19 1557 06/28/19 1631  BP:   127/67   Pulse:   82   Resp: 15 18 14 15   Temp:   99.1 F (37.3 C)   TempSrc:   Oral   SpO2: 100% 100% 100% 100%  Weight:      Height:        Intake/Output from previous day:   Intake/Output Summary (Last 24 hours) at 06/28/2019 1643 Last data filed at 06/27/2019 2304 Gross per 24 hour  Intake 240 ml  Output --  Net 240 ml    Physical Exam: General: Alert, awake, oriented x3, in no acute distress.  HEENT: [place]/AT PEERL, EOMI Neck: Trachea midline,  no masses, no thyromegal,y no JVD, no carotid bruit OROPHARYNX:  Moist, No exudate/ erythema/lesions.  Heart: Regular rate and rhythm, without murmurs, rubs, gallops, PMI non-displaced, no heaves or thrills on palpation.  Lungs: Clear to auscultation, no wheezing or rhonchi noted. No increased vocal fremitus resonant to percussion  Abdomen: Soft, nontender, nondistended, positive bowel sounds, no masses no hepatosplenomegaly noted..  Neuro: No focal neurological deficits noted cranial nerves II through XII grossly intact. DTRs 2+ bilaterally upper and lower extremities. Strength 5 out of 5 in bilateral upper and lower extremities. Musculoskeletal: No warm swelling or erythema around joints, no spinal tenderness noted. Psychiatric: Patient alert and oriented x3,  good insight and cognition, good recent to remote recall. Lymph node survey: No cervical axillary or inguinal lymphadenopathy noted.  Lab Results:  Basic Metabolic Panel:    Component Value Date/Time   NA 139 06/27/2019 0517   NA 142 09/11/2017 1431   K 3.9 06/27/2019 0517   CL 108 06/27/2019 0517   CO2 23 06/27/2019 0517   BUN 17 06/27/2019 0517   BUN 8 09/11/2017 1431   CREATININE 0.85 06/27/2019 0517   GLUCOSE 109 (H) 06/27/2019 0517   CALCIUM 8.4 (L) 06/27/2019 0517   CBC:    Component Value Date/Time   WBC 10.5 06/27/2019 0517   HGB 10.8 (L) 06/27/2019 0517   HGB 13.0 09/11/2017 1431   HCT 30.8 (L) 06/27/2019 0517   HCT 38.4 09/11/2017 1431   PLT 226 06/27/2019 0517   PLT 258 09/11/2017 1431   MCV 71.3 (L) 06/27/2019 0517   MCV 75 (L) 09/11/2017 1431   NEUTROABS 5.9 06/27/2019 0517   NEUTROABS 3.9 09/11/2017 1431   LYMPHSABS 3.3 06/27/2019 0517   LYMPHSABS 2.2 09/11/2017 1431   MONOABS 1.0 06/27/2019 0517   EOSABS 0.3 06/27/2019 0517   EOSABS 0.1 09/11/2017 1431   BASOSABS 0.0 06/27/2019 0517   BASOSABS 0.0 09/11/2017 1431    Recent Results (from the past 240 hour(s))  Urine culture     Status: Abnormal   Collection Time: 06/26/19  3:56 PM   Specimen: Urine, Random  Result Value Ref Range Status   Specimen Description   Final    URINE, RANDOM  Performed at [facility], [address] [place], [place] [address]    Special Requests   Final    NONE Performed at [facility], [address] [place], [place] [address]    Culture (A)  Final    <10,000 COLONIES/mL INSIGNIFICANT GROWTH Performed at [facility], [address] [place], [place] [address]    Report Status 06/27/2019 FINAL  Final  SARS Coronavirus 2 by RT PCR (hospital order, performed in [facility] hospital lab) Nasopharyngeal Nasopharyngeal Swab     Status: None   Collection Time: 06/26/19  3:56 PM   Specimen: Nasopharyngeal Swab  Result Value  Ref Range Status   SARS Coronavirus 2 NEGATIVE NEGATIVE Final    Comment: (NOTE) SARS-CoV-2 target nucleic acids are NOT DETECTED. The SARS-CoV-2 RNA is generally detectable in upper and lower respiratory specimens during the acute phase of infection. The lowest concentration of SARS-CoV-2 viral copies this assay can detect is 250 copies / mL. A negative result does not preclude SARS-CoV-2 infection and should not be used as the sole basis for treatment or other patient management decisions.  A negative result may occur with improper specimen collection / handling, submission of specimen other than nasopharyngeal swab, presence of viral mutation(s) within the areas targeted by this assay, and inadequate number of viral copies (<250 copies / mL). A negative result must be combined with clinical observations, patient history, and epidemiological information. Fact Sheet for Patients:   BoilerBrush.com.cy Fact Sheet for Healthcare Providers: https://pope.com/ This test is not yet approved or cleared  by the [place] FDA and has been authorized for detection and/or diagnosis of SARS-CoV-2 by FDA under an Emergency Use Authorization (EUA).  This EUA will remain in effect (meaning this test can be used) for the duration of the COVID-19 declaration under Section 564(b)(1) of the Act, 21 U.S.C. section 360bbb-3(b)(1), unless the authorization is terminated or revoked sooner. Performed at [facility], [address] [place], [place] [address]     Studies/Results: No results found.  Medications: Scheduled Meds: . enoxaparin (LOVENOX) injection  40 mg Subcutaneous Q24H  . folic acid  1 mg Oral Daily  . HYDROmorphone   Intravenous Q4H  . hydroxyurea  2,000 mg Oral Daily  . ketorolac  30 mg Intravenous Q6H  . oxyCODONE  15 mg Oral Q12H  . senna-docusate  1 tablet Oral BID  . tiZANidine  4 mg Oral TID  . vitamin  B-12  50 mcg Oral Daily  . Vitamin D (Ergocalciferol)  50,000 Units Oral Q7 days  . voxelotor  1,500 mg Oral Daily   Continuous Infusions: . dextrose 5 % and 0.45% NaCl 75 mL/hr at 06/28/19 1126  . diphenhydrAMINE     PRN Meds:.diphenhydrAMINE **OR** diphenhydrAMINE, docusate sodium, [START ON 07/01/2019] ibuprofen, naloxone **AND** sodium chloride flush, ondansetron (ZOFRAN) IV, polyethylene glycol  Consultants:  None  Procedures:  None  Antibiotics:  None  Assessment/Plan: Principal Problem:   Sickle cell anemia with crisis (HCC) Active Problems:   Chronic pain syndrome   Anxiety  Sickle cell disease with pain crisis:  Continue IV fluids D5 0.45% Continue IV Dilaudid PCA, no changes in settings on today. Monitor vital signs closely, reevaluated pain scale regularly, and supplemental oxygen as needed  Anemia of chronic disease:  Stable. Consistent with patient's baseline. There is no clinical indication for blood transfusion on today.   Chronic pain syndrome:  Continue home medications  History of depression and  anxiety:  Stable. Patient denies any suicidal or homicidal ideations. Continue home medications.     Code Status: Full Code Family Communication: N/A Disposition Plan: Not yet ready for discharge    Xzaiver Vayda Rennis Petty  APRN, MSN, FNP-C [facility] [address] [place], [place] [address] [phone]  If 5PM-7AM, please contact night-coverage.  06/28/2019, 4:43 PM  LOS: 2 days

## 2019-06-29 MED ORDER — SODIUM CHLORIDE 0.9% FLUSH
10.0000 mL | Freq: Two times a day (BID) | INTRAVENOUS | Status: DC
  Administered 2019-06-29 – 2019-06-30 (×3): 10 mL

## 2019-06-29 MED ORDER — SODIUM CHLORIDE 0.9% FLUSH: 10.0000 mL | INTRAVENOUS | Status: DC | PRN

## 2019-06-29 MED ORDER — HYDROMORPHONE 1 MG/ML IV SOLN
INTRAVENOUS | Status: DC
  Filled 2019-06-29: qty 30

## 2019-06-29 MED ORDER — PROMETHAZINE HCL 25 MG PO TABS: 25.0000 mg | ORAL_TABLET | ORAL | Status: DC | PRN

## 2019-06-29 MED ORDER — OXYCODONE HCL 5 MG PO TABS
20.0000 mg | ORAL_TABLET | ORAL | Status: DC | PRN
  Administered 2019-06-30 – 2019-07-01 (×7): 20 mg via ORAL
  Filled 2019-06-29 (×7): qty 4

## 2019-06-29 NOTE — Progress Notes (Addendum)
Subjective: Stephanie Burch, a 39 year old female with a medical history significant for sickle cell disease, chronic pain syndrome, opiate dependence and tolerance, morbid obesity, anxiety, and history of anemia of chronic disease was admitted for sickle cell pain crisis.  Patient continues to complain of pain primarily to lower extremities.  She has no new complaints on today.  Pain intensity is 6/10 characterized as constant and aching.  Patient endorses nausea.  Denies headache, urinary symptoms constipation, or diarrhea. Patient is afebrile and oxygen saturation is 98% on room air.  Objective:  Vital signs in last 24 hours:  Vitals:   06/29/19 1146 06/29/19 1201 06/29/19 1603 06/29/19 1605  BP:  125/66 140/83   Pulse:  84 79   Resp: 14 18 18 13   Temp:  98.2 F (36.8 C) 98.3 F (36.8 C)   TempSrc:  Oral Oral   SpO2: 100% 99% 98% 98%  Weight:      Height:        Intake/Output from previous day:   Intake/Output Summary (Last 24 hours) at 06/29/2019 1655 Last data filed at 06/29/2019 1504 Gross per 24 hour  Intake 4202.83 ml  Output --  Net 4202.83 ml    Physical Exam: General: Alert, awake, oriented x3, in no acute distress.  HEENT: [place]/AT PEERL, EOMI Neck: Trachea midline,  no masses, no thyromegal,y no JVD, no carotid bruit OROPHARYNX:  Moist, No exudate/ erythema/lesions.  Heart: Regular rate and rhythm, without murmurs, rubs, gallops, PMI non-displaced, no heaves or thrills on palpation.  Lungs: Clear to auscultation, no wheezing or rhonchi noted. No increased vocal fremitus resonant to percussion  Abdomen: Soft, nontender, nondistended, positive bowel sounds, no masses no hepatosplenomegaly noted..  Neuro: No focal neurological deficits noted cranial nerves II through XII grossly intact. DTRs 2+ bilaterally upper and lower extremities. Strength 5 out of 5 in bilateral upper and lower extremities. Musculoskeletal: No warm swelling or erythema around joints, no spinal  tenderness noted. Psychiatric: Patient alert and oriented x3, good insight and cognition, good recent to remote recall. Lymph node survey: No cervical axillary or inguinal lymphadenopathy noted.  Lab Results:  Basic Metabolic Panel:    Component Value Date/Time   NA 139 06/27/2019 0517   NA 142 09/11/2017 1431   K 3.9 06/27/2019 0517   CL 108 06/27/2019 0517   CO2 23 06/27/2019 0517   BUN 17 06/27/2019 0517   BUN 8 09/11/2017 1431   CREATININE 0.85 06/27/2019 0517   GLUCOSE 109 (H) 06/27/2019 0517   CALCIUM 8.4 (L) 06/27/2019 0517   CBC:    Component Value Date/Time   WBC 10.5 06/27/2019 0517   HGB 10.8 (L) 06/27/2019 0517   HGB 13.0 09/11/2017 1431   HCT 30.8 (L) 06/27/2019 0517   HCT 38.4 09/11/2017 1431   PLT 226 06/27/2019 0517   PLT 258 09/11/2017 1431   MCV 71.3 (L) 06/27/2019 0517   MCV 75 (L) 09/11/2017 1431   NEUTROABS 5.9 06/27/2019 0517   NEUTROABS 3.9 09/11/2017 1431   LYMPHSABS 3.3 06/27/2019 0517   LYMPHSABS 2.2 09/11/2017 1431   MONOABS 1.0 06/27/2019 0517   EOSABS 0.3 06/27/2019 0517   EOSABS 0.1 09/11/2017 1431   BASOSABS 0.0 06/27/2019 0517   BASOSABS 0.0 09/11/2017 1431    Recent Results (from the past 240 hour(s))  Urine culture     Status: Abnormal   Collection Time: 06/26/19  3:56 PM   Specimen: Urine, Random  Result Value Ref Range Status   Specimen Description  Final    URINE, RANDOM Performed at [facility], [address], [place], [place] [address]    Special Requests   Final    NONE Performed at [facility], [address], [place], [place] [address]    Culture (A)  Final    <10,000 COLONIES/mL INSIGNIFICANT GROWTH Performed at [facility], [address], [place], [place] [address]    Report Status 06/27/2019 FINAL  Final  SARS Coronavirus 2 by RT PCR (hospital order, performed in [facility] hospital lab) Nasopharyngeal Nasopharyngeal Swab     Status: None   Collection Time:  06/26/19  3:56 PM   Specimen: Nasopharyngeal Swab  Result Value Ref Range Status   SARS Coronavirus 2 NEGATIVE NEGATIVE Final    Comment: (NOTE) SARS-CoV-2 target nucleic acids are NOT DETECTED. The SARS-CoV-2 RNA is generally detectable in upper and lower respiratory specimens during the acute phase of infection. The lowest concentration of SARS-CoV-2 viral copies this assay can detect is 250 copies / mL. A negative result does not preclude SARS-CoV-2 infection and should not be used as the sole basis for treatment or other patient management decisions.  A negative result may occur with improper specimen collection / handling, submission of specimen other than nasopharyngeal swab, presence of viral mutation(s) within the areas targeted by this assay, and inadequate number of viral copies (<250 copies / mL). A negative result must be combined with clinical observations, patient history, and epidemiological information. Fact Sheet for Patients:   BoilerBrush.com.cy Fact Sheet for Healthcare Providers: https://pope.com/ This test is not yet approved or cleared  by the [place] FDA and has been authorized for detection and/or diagnosis of SARS-CoV-2 by FDA under an Emergency Use Authorization (EUA).  This EUA will remain in effect (meaning this test can be used) for the duration of the COVID-19 declaration under Section 564(b)(1) of the Act, 21 U.S.C. section 360bbb-3(b)(1), unless the authorization is terminated or revoked sooner. Performed at [facility], [address], [place], [place] [address]     Studies/Results: No results found.  Medications: Scheduled Meds: . enoxaparin (LOVENOX) injection  40 mg Subcutaneous Q24H  . folic acid  1 mg Oral Daily  . HYDROmorphone   Intravenous Q4H  . hydroxyurea  2,000 mg Oral Daily  . ketorolac  30 mg Intravenous Q6H  . oxyCODONE  15 mg Oral Q12H  .  senna-docusate  1 tablet Oral BID  . sodium chloride flush  10-40 mL Intracatheter Q12H  . tiZANidine  4 mg Oral TID  . vitamin B-12  50 mcg Oral Daily  . Vitamin D (Ergocalciferol)  50,000 Units Oral Q7 days  . voxelotor  1,500 mg Oral Daily   Continuous Infusions: . dextrose 5 % and 0.45% NaCl 75 mL/hr at 06/29/19 1504  . diphenhydrAMINE     PRN Meds:.diphenhydrAMINE **OR** diphenhydrAMINE, docusate sodium, [START ON 07/01/2019] ibuprofen, naloxone **AND** sodium chloride flush, ondansetron (ZOFRAN) IV, polyethylene glycol, sodium chloride flush  Consultants:  None  Procedures:  None  Antibiotics:  None  Assessment/Plan: Principal Problem:   Sickle cell anemia with crisis (HCC) Active Problems:   Chronic pain syndrome   Anxiety  Sickle cell disease with pain crisis: Continue IV fluids at [facility] Weaning IV Dilaudid PCA, settings changed to 0.5 mg, 10-minute lockout, and 1.5 mg/h Oxycodone 20 mg every 4 hours as needed for severe breakthrough pain Monitor vital signs closely, reevaluate pain scale regularly, and supplemental oxygen as needed  Anemia of chronic disease: Stable.  Consistent with patient's baseline.  There is no clinical indication for blood transfusion at this time.  Repeat CBC in a.m.  Chronic pain syndrome: Continue home medications  History of depression and anxiety:  Stable.  Patient denies any suicidal homicidal ideations.  Continue home medications.  Nausea: Patient continues to complain of nausea that is not controlled with ondansetron.  Promethazine 25 mg every 4 hours as needed.  Advance diet as tolerated.  Code Status: Full Code Family Communication: N/A Disposition Plan: Not yet ready for discharge   [place], MSN, FNP-C Patient [place] [address] [place], [place] [address] [phone]  If 5PM-7AM, please contact night-coverage.  06/29/2019, 4:55 PM  LOS: 3 days

## 2019-06-29 NOTE — Plan of Care (Signed)
  Problem: Education: Goal: Awareness of infection prevention will improve Outcome: Progressing   Problem: Education: Goal: Awareness of signs and symptoms of anemia will improve Outcome: Progressing   Problem: Education: Goal: Long-term complications will improve Outcome: Progressing   Problem: Self-Care: Goal: Ability to incorporate actions that prevent/reduce pain crisis will improve Outcome: Progressing   Problem: Tissue Perfusion: Goal: Complications related to inadequate tissue perfusion will be avoided or minimized Outcome: Progressing   Problem: Respiratory: Goal: Pulmonary complications will be avoided or minimized Outcome: Progressing   Problem: Sensory: Goal: Pain level will decrease with appropriate interventions Outcome: Progressing

## 2019-06-30 ENCOUNTER — Telehealth: Payer: Self-pay | Admitting: Family Medicine

## 2019-06-30 LAB — CBC
HCT: 30.4 % — ABNORMAL LOW (ref 36.0–46.0)
Hemoglobin: 10.5 g/dL — ABNORMAL LOW (ref 12.0–15.0)
MCH: 24.8 pg — ABNORMAL LOW (ref 26.0–34.0)
MCHC: 34.5 g/dL (ref 30.0–36.0)
MCV: 71.9 fL — ABNORMAL LOW (ref 80.0–100.0)
Platelets: 227 10*3/uL (ref 150–400)
RBC: 4.23 MIL/uL (ref 3.87–5.11)
RDW: 13.6 % (ref 11.5–15.5)
WBC: 9.1 10*3/uL (ref 4.0–10.5)
nRBC: 0.6 % — ABNORMAL HIGH (ref 0.0–0.2)

## 2019-06-30 MED ORDER — HYDROMORPHONE 1 MG/ML IV SOLN
INTRAVENOUS | Status: DC
  Administered 2019-07-01: 0.3 mg via INTRAVENOUS
  Administered 2019-07-01: 0.9 mg via INTRAVENOUS

## 2019-06-30 NOTE — BH Specialist Note (Signed)
Integrated Behavioral Health Note  Session Start time: 10:40   End Time: 11:40 Total Time: 60 Type of Service: Behavioral Health - Individual/Family Interpreter: No.   Interpreter Name & Language: none  Session #: 11/20  SUBJECTIVE: Stephanie Burch is a 39 y.o. female brought in by patient.  Pt./Family was referred by NP for:  stress precipitating sickle cell crisis, recently also for overuse of pain medications. 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 Reduce symptoms of: anxiety and stress  2. Increase knowledge and/or ability of: coping skills and self-management skills  3. Demonstrate ability to: Increase healthy adjustment to current life circumstances  INTERVENTIONS: Interventions utilized:  Supportive Counseling Standardized Assessments completed: GAD-7 and PHQ 9   Depression screen [facility] 2/9 06/30/2019 04/27/2019 02/28/2019 01/18/2019 10/01/2018  Decreased Interest 1 0 0 0 2  Down, Depressed,  Hopeless 1 0 0 0 2  PHQ - 2 Score 2 0 0 0 4  Altered sleeping 3 - - - 3  Tired, decreased energy 1 - - - 3  Change in appetite 1 - - - 3  Feeling bad or failure about yourself  1 - - - 2  Trouble concentrating 1 - - - 2  Moving slowly or fidgety/restless 1 - - - 0  Suicidal thoughts 0 - - - 0  PHQ-9 Score 10 - - - 17  Difficult doing work/chores Not difficult at all - - - Very difficult   GAD 7 : Generalized Anxiety Score 06/30/2019 10/01/2018  Nervous, Anxious, on Edge 1 2  Control/stop worrying 1 2  Worry too much - different things 1 3  Trouble relaxing 1 3  Restless 1 3  Easily annoyed or irritable 1 2  Afraid - awful might happen 0 1  Total GAD 7 Score 6 16  Anxiety Difficulty Not difficult at all Very difficult    ASSESSMENT: Patient currently experiencing difficulty in managing sickle cell pain and is distressed by crises that lead to hospitalization. She is also experiencing distress related to cognitions about acceptance of her illness vs. "giving up."  Patient has also been stressed about difficulties in intimate relationships, but reported that some of this has resolved in the past two months. Today reassessed patient goals for treatment, as CSW and patient have not had a session in two months. Provided brief supportive counseling. Will plan to schedule an outpatient appointment when patient discharged from hospital.   Patient may benefit from continued supportive counseling including CBT and ACT approaches to challenging and thoughts and core beliefs about self, relationships, and pain, and exploring pain acceptance.  PLAN: 1. Follow up with behavioral health clinician on: after discharge from hospital 2. Behavioral recommendations:  3. Referral(s): [facility] (In Clinic)  Darl Pikes  [place], [facility] [phone]

## 2019-06-30 NOTE — Plan of Care (Signed)
  Problem: Education: Goal: Awareness of infection prevention will improve Outcome: Progressing   Problem: Education: Goal: Awareness of signs and symptoms of anemia will improve Outcome: Progressing   Problem: Self-Care: Goal: Ability to incorporate actions that prevent/reduce pain crisis will improve Outcome: Progressing   Problem: Respiratory: Goal: Pulmonary complications will be avoided or minimized Outcome: Progressing

## 2019-06-30 NOTE — Progress Notes (Signed)
Patient ID: Julyanna Scholle, female   DOB: [birth year], 39 y.o.   MRN: [id number] Subjective: Purva Vessell, a 39 year old female with a medical history significant for sickle cell disease, chronic pain syndrome, opiate dependence and tolerance, morbid obesity, anxiety, and history of anemia of chronic disease was admitted for sickle cell pain crisis.  Patient claims her pain is better controlled today, although still at about 6/10 as against her goal of 3/10.  Her pain is mostly in her lower extremities.  No new complaints today.  She denies any fever, headache, cough, chest pain, shortness of breath, urinary symptoms, nausea, vomiting or diarrhea.  Patient is tolerating p.o. intake with no restrictions.  Objective:  Vital signs in last 24 hours:  Vitals:   06/30/19 0513 06/30/19 0759 06/30/19 0905 06/30/19 1205  BP: 138/75  112/70 131/78  Pulse: 88  83 95  Resp: 18 15 12 19   Temp: 97.8 F (36.6 C)  98.4 F (36.9 C) 97.9 F (36.6 C)  TempSrc: Oral  Oral Oral  SpO2: 100% 100% 100% 98%  Weight:      Height:        Intake/Output from previous day:   Intake/Output Summary (Last 24 hours) at 06/30/2019 1506 Last data filed at 06/30/2019 1400 Gross per 24 hour  Intake 490 ml  Output --  Net 490 ml    Physical Exam: General: Alert, awake, oriented x3, in no acute distress.  HEENT: [place]/AT PEERL, EOMI Neck: Trachea midline,  no masses, no thyromegal,y no JVD, no carotid bruit OROPHARYNX:  Moist, No exudate/ erythema/lesions.  Heart: Regular rate and rhythm, without murmurs, rubs, gallops, PMI non-displaced, no heaves or thrills on palpation.  Lungs: Clear to auscultation, no wheezing or rhonchi noted. No increased vocal fremitus resonant to percussion  Abdomen: Soft, nontender, nondistended, positive bowel sounds, no masses no hepatosplenomegaly noted..  Neuro: No focal neurological deficits noted cranial nerves II through XII grossly intact. DTRs 2+ bilaterally upper and lower  extremities. Strength 5 out of 5 in bilateral upper and lower extremities. Musculoskeletal: No warm swelling or erythema around joints, no spinal tenderness noted. Psychiatric: Patient alert and oriented x3, good insight and cognition, good recent to remote recall. Lymph node survey: No cervical axillary or inguinal lymphadenopathy noted.  Lab Results:  Basic Metabolic Panel:    Component Value Date/Time   NA 139 06/27/2019 0517   NA 142 09/11/2017 1431   K 3.9 06/27/2019 0517   CL 108 06/27/2019 0517   CO2 23 06/27/2019 0517   BUN 17 06/27/2019 0517   BUN 8 09/11/2017 1431   CREATININE 0.85 06/27/2019 0517   GLUCOSE 109 (H) 06/27/2019 0517   CALCIUM 8.4 (L) 06/27/2019 0517   CBC:    Component Value Date/Time   WBC 9.1 06/30/2019 0720   HGB 10.5 (L) 06/30/2019 0720   HGB 13.0 09/11/2017 1431   HCT 30.4 (L) 06/30/2019 0720   HCT 38.4 09/11/2017 1431   PLT 227 06/30/2019 0720   PLT 258 09/11/2017 1431   MCV 71.9 (L) 06/30/2019 0720   MCV 75 (L) 09/11/2017 1431   NEUTROABS 5.9 06/27/2019 0517   NEUTROABS 3.9 09/11/2017 1431   LYMPHSABS 3.3 06/27/2019 0517   LYMPHSABS 2.2 09/11/2017 1431   MONOABS 1.0 06/27/2019 0517   EOSABS 0.3 06/27/2019 0517   EOSABS 0.1 09/11/2017 1431   BASOSABS 0.0 06/27/2019 0517   BASOSABS 0.0 09/11/2017 1431    Recent Results (from the past 240 hour(s))  Urine culture  Status: Abnormal   Collection Time: 06/26/19  3:56 PM   Specimen: Urine, Random  Result Value Ref Range Status   Specimen Description   Final    URINE, RANDOM Performed at [facility], [address], [place], [place] [address]    Special Requests   Final    NONE Performed at [facility], [address], [place], [place] [address]    Culture (A)  Final    <10,000 COLONIES/mL INSIGNIFICANT GROWTH Performed at [facility], [address] [place], [place] [address]    Report Status 06/27/2019 FINAL  Final  SARS  Coronavirus 2 by RT PCR (hospital order, performed in [name] hospital lab) Nasopharyngeal Nasopharyngeal Swab     Status: None   Collection Time: 06/26/19  3:56 PM   Specimen: Nasopharyngeal Swab  Result Value Ref Range Status   SARS Coronavirus 2 NEGATIVE NEGATIVE Final    Comment: (NOTE) SARS-CoV-2 target nucleic acids are NOT DETECTED. The SARS-CoV-2 RNA is generally detectable in upper and lower respiratory specimens during the acute phase of infection. The lowest concentration of SARS-CoV-2 viral copies this assay can detect is 250 copies / mL. A negative result does not preclude SARS-CoV-2 infection and should not be used as the sole basis for treatment or other patient management decisions.  A negative result may occur with improper specimen collection / handling, submission of specimen other than nasopharyngeal swab, presence of viral mutation(s) within the areas targeted by this assay, and inadequate number of viral copies (<250 copies / mL). A negative result must be combined with clinical observations, patient history, and epidemiological information. Fact Sheet for Patients:   BoilerBrush.com.cy Fact Sheet for Healthcare Providers: https://pope.com/ This test is not yet approved or cleared  by the [place] FDA and has been authorized for detection and/or diagnosis of SARS-CoV-2 by FDA under an Emergency Use Authorization (EUA).  This EUA will remain in effect (meaning this test can be used) for the duration of the COVID-19 declaration under Section 564(b)(1) of the Act, 21 U.S.C. section 360bbb-3(b)(1), unless the authorization is terminated or revoked sooner. Performed at [facility], [address], [place], [place] [address]     Studies/Results: No results found.  Medications: Scheduled Meds: . enoxaparin (LOVENOX) injection  40 mg Subcutaneous Q24H  . folic acid  1 mg Oral Daily  .  HYDROmorphone   Intravenous Q4H  . hydroxyurea  2,000 mg Oral Daily  . ketorolac  30 mg Intravenous Q6H  . oxyCODONE  15 mg Oral Q12H  . senna-docusate  1 tablet Oral BID  . sodium chloride flush  10-40 mL Intracatheter Q12H  . tiZANidine  4 mg Oral TID  . vitamin B-12  50 mcg Oral Daily  . Vitamin D (Ergocalciferol)  50,000 Units Oral Q7 days  . voxelotor  1,500 mg Oral Daily   Continuous Infusions: . diphenhydrAMINE     PRN Meds:.diphenhydrAMINE **OR** diphenhydrAMINE, docusate sodium, [START ON 07/01/2019] ibuprofen, naloxone **AND** sodium chloride flush, ondansetron (ZOFRAN) IV, oxyCODONE, polyethylene glycol, promethazine, sodium chloride flush  Consultants:  None  Procedures:  None  Antibiotics:  None  Assessment/Plan: Principal Problem:   Sickle cell anemia with crisis (HCC) Active Problems:   Chronic pain syndrome   Anxiety  1. Hb Sickle Cell Disease with crisis: Continue IV[facility] at [facility], continue to wean weight based Dilaudid PCA, continue oxycodone 20 mg tablet p.o. every 4 hours as needed for breakthrough pain, Monitor vitals very  closely, Re-evaluate pain scale regularly, 2 L of Oxygen by [place]. 2. 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  No clinical indication for blood transfusion today. 3. Chronic pain Syndrome: Continue home medications. 4. Major depression and anxiety disorder: Stable. Patient denies any suicidal ideations or thoughts. Continue home medications. Patient is currently being followed by our licensed clinical social worker for counseling and support.  Code Status: Full Code Family Communication: N/A Disposition Plan: Not yet ready for discharge  Nazariah Cadet  If 7PM-7AM, please contact night-coverage.  06/30/2019, 3:06 PM  LOS: 4 days

## 2019-06-30 NOTE — Telephone Encounter (Signed)
Pt called in refill on oxycodone 20mg 

## 2019-07-01 ENCOUNTER — Other Ambulatory Visit: Payer: Self-pay | Admitting: Family Medicine

## 2019-07-01 ENCOUNTER — Telehealth: Payer: Self-pay | Admitting: Family Medicine

## 2019-07-01 DIAGNOSIS — F1190 Opioid use, unspecified, uncomplicated: Secondary | ICD-10-CM

## 2019-07-01 DIAGNOSIS — G894 Chronic pain syndrome: Secondary | ICD-10-CM

## 2019-07-01 DIAGNOSIS — D571 Sickle-cell disease without crisis: Secondary | ICD-10-CM

## 2019-07-01 MED ORDER — XTAMPZA ER 13.5 MG PO C12A: 13.5000 mg | EXTENDED_RELEASE_CAPSULE | Freq: Two times a day (BID) | ORAL | 0 refills | Status: AC

## 2019-07-01 MED ORDER — OXYCODONE HCL 20 MG PO TABS: 20.0000 mg | ORAL_TABLET | ORAL | 0 refills | Status: DC | PRN

## 2019-07-01 NOTE — Telephone Encounter (Signed)
Pt currently discharged. Pt is able to pick up meds

## 2019-07-01 NOTE — Telephone Encounter (Signed)
Pt wanted you to know she will be out of the hospital today

## 2019-07-01 NOTE — Progress Notes (Signed)
Discussed with patient discharge instructions, she verbalized agreement and understanding.  Patient to leave in private vehicle with all belongings.   

## 2019-07-01 NOTE — Discharge Instructions (Signed)
Sickle Cell Anemia, Adult  Sickle cell anemia is a condition where your red blood cells are shaped like sickles. Red blood cells carry oxygen through the body. Sickle-shaped cells do not live as long as normal red blood cells. They also clump together and block blood from flowing through the blood vessels. This prevents the body from getting enough oxygen. Sickle cell anemia causes organ damage and pain. It also increases the risk of infection. Follow these instructions at home: Medicines  Take over-the-counter and prescription medicines only as told by your doctor.  If you were prescribed an antibiotic medicine, take it as told by your doctor. Do not stop taking the antibiotic even if you start to feel better.  If you develop a fever, do not take medicines to lower the fever right away. Tell your doctor about the fever. Managing pain, stiffness, and swelling  Try these methods to help with pain: ? Use a heating pad. ? Take a warm bath. ? Distract yourself, such as by watching TV. Eating and drinking  Drink enough fluid to keep your pee (urine) clear or pale yellow. Drink more in hot weather and during exercise.  Limit or avoid alcohol.  Eat a healthy diet. Eat plenty of fruits, vegetables, whole grains, and lean protein.  Take vitamins and supplements as told by your doctor. Traveling  When traveling, keep these with you: ? Your medical information. ? The names of your doctors. ? Your medicines.  If you need to take an airplane, talk to your doctor first. Activity  Rest often.  Avoid exercises that make your heart beat much faster, such as jogging. General instructions  Do not use products that have nicotine or tobacco, such as cigarettes and e-cigarettes. If you need help quitting, ask your doctor.  Consider wearing a medical alert bracelet.  Avoid being in high places (high altitudes), such as mountains.  Avoid very hot or cold temperatures.  Avoid places where the  temperature changes a lot.  Keep all follow-up visits as told by your doctor. This is important. Contact a doctor if:  A joint hurts.  Your feet or hands hurt or swell.  You feel tired (fatigued). Get help right away if:  You have symptoms of infection. These include: ? Fever. ? Chills. ? Being very tired. ? Irritability. ? Poor eating. ? Throwing up (vomiting).  You feel dizzy or faint.  You have new stomach pain, especially on the left side.  You have a an erection (priapism) that lasts more than 4 hours.  You have numbness in your arms or legs.  You have a hard time moving your arms or legs.  You have trouble talking.  You have pain that does not go away when you take medicine.  You are short of breath.  You are breathing fast.  You have a long-term cough.  You have pain in your chest.  You have a bad headache.  You have a stiff neck.  Your stomach looks bloated even though you did not eat much.  Your skin is pale.  You suddenly cannot see well. Summary  Sickle cell anemia is a condition where your red blood cells are shaped like sickles.  Follow your doctor's advice on ways to manage pain, food to eat, activities to do, and steps to take for safe travel.  Get medical help right away if you have any signs of infection, such as a fever. This information is not intended to replace advice given to you by   your health care provider. Make sure you discuss any questions you have with your health care provider. Document Revised: 05/14/2018 Document Reviewed: 02/26/2016 Elsevier Patient Education  2020 [employer].  

## 2019-07-01 NOTE — Discharge Summary (Signed)
Physician Discharge Summary  Stephanie Burch FVO:[id number] DOB: 1980/04/24 DOA: 06/26/2019  PCP: Kallie Locks, FNP  Admit date: 06/26/2019  Discharge date: 07/01/2019  Discharge Diagnoses:  Principal Problem:   Sickle cell anemia with crisis [facility]) Active Problems:   Chronic pain syndrome   Anxiety   Discharge Condition: Stable  Disposition:  Follow-up Information    Kallie Locks, FNP. Schedule an appointment as soon as possible for a visit in 1 week(s).   Specialty: Family Medicine Contact information: [address] [place] [address] [phone]          Pt is discharged home in good condition and is to follow up with Kallie Locks, FNP this week to have labs evaluated. Stephanie Burch is instructed to increase activity slowly and balance with rest for the next few days, and use prescribed medication to complete treatment of pain  Diet: Regular Wt Readings from Last 3 Encounters:  06/26/19 120.2 kg  06/02/19 117.9 kg  04/27/19 126.1 kg    History of present illness:  Patient is a 39 year old female with known history of sickle cell disease who presents to the ER with sickle cell painful crisis.  Patient has chronic pain and opiate tolerant with long-acting morphine as well as short acting.  She came to the ER due to worsening pain in her legs and back.  Pain was not controlled with home regimen.  She is taken it and not getting adequate relief.  She was seen in the ER got up to 6 mg of IV Dilaudid with no relief.  She denied any fever or chills no nausea vomiting or diarrhea.  Patient therefore is being admitted to the hospital with acute sickle cell crisis that has failed outpatient treatment.  Hospital Course:  Patient was admitted for sickle cell pain crisis and managed appropriately with IVF, IV Dilaudid via PCA and IV Toradol, as well as other adjunct therapies per sickle cell pain management protocols. Patient's symptoms slowly improved on above  regimen until pain returned to baseline. Hemoglobin remained stable at baseline throughout admission, with no indication for blood transfusion. Urine culture showed less than 10,000 colonies/ml which is an insignificant growth. As at today patient is doing really well, eating and drinking well with no restrictions, ambulating well with no significant pain. Vital signs were stable. Patient was therefore discharged home today in a hemodynamically stable condition.  Patient will follow-up with her PCP within 1 week of this discharge. Patient was counseled extensively about other nonpharmacologic means of pain control, she verbalized understanding and was appreciative of her care during this admission.  Discharge Exam: Vitals:   07/01/19 0757 07/01/19 0800  BP: (!) 112/49   Pulse: 72   Resp: 18 14  Temp: 98 F (36.7 C)   SpO2: 100% 100%   Vitals:   07/01/19 0408 07/01/19 0415 07/01/19 0757 07/01/19 0800  BP: 132/77  (!) 112/49   Pulse: 80  72   Resp: 16 14 18 14   Temp: 98.2 F (36.8 C)  98 F (36.7 C)   TempSrc:   Oral   SpO2: 100% 99% 100% 100%  Weight:      Height:        General appearance : Awake, alert, not in any distress. Speech Clear. Not toxic looking HEENT: Atraumatic and Normocephalic, pupils equally reactive to light and accomodation Neck: Supple, no JVD. No cervical lymphadenopathy.  Chest: Good air entry bilaterally, no added sounds  CVS: S1 S2 regular, no murmurs.  Abdomen: Bowel sounds present, Non tender and not distended with no gaurding, rigidity or rebound. Extremities: B/L Lower Ext shows no edema, both legs are warm to touch Neurology: Awake alert, and oriented X 3, CN II-XII intact, Non focal Skin: No Rash  Discharge Instructions  Discharge Instructions    Diet - low sodium heart healthy   Complete by: As directed    Increase activity slowly   Complete by: As directed      Allergies as of 07/01/2019   No Known Allergies     Medication List    TAKE  these medications   docusate sodium 100 MG capsule Commonly known as: Colace Take 1 capsule (100 mg total) by mouth 2 (two) times daily. What changed:   when to take this  reasons to take this   DULoxetine 20 MG capsule Commonly known as: CYMBALTA TAKE 1 CAPSULE BY MOUTH EVERY DAY   folic acid 1 MG tablet Commonly known as: FOLVITE Take 1 tablet (1 mg total) by mouth daily.   hydroxyurea 500 MG capsule Commonly known as: Hydrea Take 4 capsules (2,000 mg total) by mouth daily. May take with food to minimize GI side effects.   ibuprofen 800 MG tablet Commonly known as: ADVIL TAKE 1 TABLET (800 MG TOTAL) BY MOUTH EVERY 8 (EIGHT) HOURS AS NEEDED FOR HEADACHE, MILD PAIN, MODERATE PAIN OR CRAMPING.   naloxone 0.4 MG/ML injection Commonly known as: NARCAN As needed   ondansetron 4 MG tablet Commonly known as: ZOFRAN TAKE 1 TABLET BY MOUTH EVERY 8 HOURS AS NEEDED FOR NAUSEA AND VOMITING   Oxbryta 500 MG Tabs tablet Generic drug: voxelotor Take 1,500 mg by mouth daily. For Sickle Cell Anemia   Oxycodone HCl 20 MG Tabs Take 1 tablet (20 mg total) by mouth every 4 (four) hours as needed (for pain).   prochlorperazine 10 MG tablet Commonly known as: COMPAZINE Take 1 tablet (10 mg total) by mouth every 6 (six) hours as needed for nausea or vomiting. Take if Ondansetron is not effective.   tiZANidine 4 MG capsule Commonly known as: ZANAFLEX Take 1 capsule (4 mg total) by mouth 3 (three) times daily.   vitamin B-12 50 MCG tablet Commonly known as: CYANOCOBALAMIN Take 1 tablet (50 mcg total) by mouth daily.   Vitamin D (Ergocalciferol) 1.25 MG (50000 UNIT) Caps capsule Commonly known as: DRISDOL Take 1 capsule (50,000 Units total) by mouth every 7 (seven) days.   Xtampza ER 13.5 MG C12a Generic drug: oxyCODONE ER Take 13.5 mg by mouth every 12 (twelve) hours.       The results of significant diagnostics from this hospitalization (including imaging, microbiology,  ancillary and laboratory) are listed below for reference.    Significant Diagnostic Studies: No results found.  Microbiology: Recent Results (from the past 240 hour(s))  Urine culture     Status: Abnormal   Collection Time: 06/26/19  3:56 PM   Specimen: Urine, Random  Result Value Ref Range Status   Specimen Description   Final    URINE, RANDOM Performed at [facility] [address], [place], [place] [address]    Special Requests   Final    NONE Performed at [facility], [place] [address] [place], [place] [address]    Culture (A)  Final    <10,000 COLONIES/mL INSIGNIFICANT GROWTH Performed at [facility] [address] [place], [place] [address]    Report Status 06/27/2019 FINAL  Final  SARS Coronavirus 2  by RT PCR (hospital order, performed in [facility] hospital lab) Nasopharyngeal Nasopharyngeal Swab     Status: None   Collection Time: 06/26/19  3:56 PM   Specimen: Nasopharyngeal Swab  Result Value Ref Range Status   SARS Coronavirus 2 NEGATIVE NEGATIVE Final    Comment: (NOTE) SARS-CoV-2 target nucleic acids are NOT DETECTED. The SARS-CoV-2 RNA is generally detectable in upper and lower respiratory specimens during the acute phase of infection. The lowest concentration of SARS-CoV-2 viral copies this assay can detect is 250 copies / mL. A negative result does not preclude SARS-CoV-2 infection and should not be used as the sole basis for treatment or other patient management decisions.  A negative result may occur with improper specimen collection / handling, submission of specimen other than nasopharyngeal swab, presence of viral mutation(s) within the areas targeted by this assay, and inadequate number of viral copies (<250 copies / mL). A negative result must be combined with clinical observations, patient history, and epidemiological information. Fact Sheet for Patients:    BoilerBrush.com.cy Fact Sheet for Healthcare Providers: https://pope.com/ This test is not yet approved or cleared  by the [place] FDA and has been authorized for detection and/or diagnosis of SARS-CoV-2 by FDA under an Emergency Use Authorization (EUA).  This EUA will remain in effect (meaning this test can be used) for the duration of the COVID-19 declaration under Section 564(b)(1) of the Act, 21 U.S.C. section 360bbb-3(b)(1), unless the authorization is terminated or revoked sooner. Performed at [facility], [address], [place], [place] [address]      Labs: Basic Metabolic Panel: Recent Labs  Lab 06/26/19 1247 06/27/19 0517  NA 139 139  K 3.9 3.9  CL 107 108  CO2 23 23  GLUCOSE 103* 109*  BUN 13 17  CREATININE 0.56 0.85  CALCIUM 9.1 8.4*   Liver Function Tests: Recent Labs  Lab 06/26/19 1247 06/27/19 0517  AST 16 16  ALT 17 14  ALKPHOS 79 64  BILITOT 0.8 0.7  PROT 7.6 6.5  ALBUMIN 4.1 3.4*   No results for input(s): LIPASE, AMYLASE in the last 168 hours. No results for input(s): AMMONIA in the last 168 hours. CBC: Recent Labs  Lab 06/26/19 1247 06/27/19 0517 06/30/19 0720  WBC 9.0 10.5 9.1  NEUTROABS 6.0 5.9  --   HGB 12.5 10.8* 10.5*  HCT 36.7 30.8* 30.4*  MCV 72.1* 71.3* 71.9*  PLT 282 226 227   Cardiac Enzymes: No results for input(s): CKTOTAL, CKMB, CKMBINDEX, TROPONINI in the last 168 hours. BNP: Invalid input(s): POCBNP CBG: No results for input(s): GLUCAP in the last 168 hours.  Time coordinating discharge: 50 minutes  Signed:  Jalik Gellatly  [facility] 07/01/2019, 11:18 AM

## 2019-07-01 NOTE — Plan of Care (Signed)
  Problem: Education: Goal: Awareness of infection prevention will improve Outcome: Progressing   Problem: Education: Goal: Awareness of signs and symptoms of anemia will improve Outcome: Progressing   Problem: Self-Care: Goal: Ability to incorporate actions that prevent/reduce pain crisis will improve Outcome: Progressing

## 2019-07-14 ENCOUNTER — Other Ambulatory Visit: Payer: Self-pay | Admitting: Family Medicine

## 2019-07-14 DIAGNOSIS — R110 Nausea: Secondary | ICD-10-CM

## 2019-07-14 NOTE — Telephone Encounter (Signed)
Please review

## 2019-07-15 ENCOUNTER — Telehealth: Payer: Self-pay | Admitting: Family Medicine

## 2019-07-15 ENCOUNTER — Other Ambulatory Visit: Payer: Self-pay | Admitting: Family Medicine

## 2019-07-15 DIAGNOSIS — G894 Chronic pain syndrome: Secondary | ICD-10-CM

## 2019-07-15 DIAGNOSIS — F1190 Opioid use, unspecified, uncomplicated: Secondary | ICD-10-CM

## 2019-07-15 DIAGNOSIS — D571 Sickle-cell disease without crisis: Secondary | ICD-10-CM

## 2019-07-15 MED ORDER — OXYCODONE HCL 20 MG PO TABS: 20.0000 mg | ORAL_TABLET | ORAL | 0 refills | Status: DC | PRN

## 2019-07-15 NOTE — Telephone Encounter (Signed)
Done

## 2019-07-22 ENCOUNTER — Ambulatory Visit (INDEPENDENT_AMBULATORY_CARE_PROVIDER_SITE_OTHER): Payer: Medicaid Other | Admitting: Family Medicine

## 2019-07-22 ENCOUNTER — Encounter: Payer: Self-pay | Admitting: Family Medicine

## 2019-07-22 VITALS — BP 146/90 | HR 92 | Temp 98.4°F | Ht 65.0 in | Wt 293.0 lb

## 2019-07-22 DIAGNOSIS — Z09 Encounter for follow-up examination after completed treatment for conditions other than malignant neoplasm: Secondary | ICD-10-CM | POA: Diagnosis not present

## 2019-07-22 DIAGNOSIS — M62838 Other muscle spasm: Secondary | ICD-10-CM

## 2019-07-22 DIAGNOSIS — G894 Chronic pain syndrome: Secondary | ICD-10-CM

## 2019-07-22 DIAGNOSIS — D571 Sickle-cell disease without crisis: Secondary | ICD-10-CM | POA: Diagnosis not present

## 2019-07-22 DIAGNOSIS — F1190 Opioid use, unspecified, uncomplicated: Secondary | ICD-10-CM

## 2019-07-22 DIAGNOSIS — Z79899 Other long term (current) drug therapy: Secondary | ICD-10-CM

## 2019-07-22 DIAGNOSIS — F419 Anxiety disorder, unspecified: Secondary | ICD-10-CM

## 2019-07-22 DIAGNOSIS — R110 Nausea: Secondary | ICD-10-CM

## 2019-07-22 NOTE — Progress Notes (Signed)
[facility]    Established Patient Office Visit  Subjective:  Patient ID: Stephanie Burch, female    DOB: 03/11/1980  Age: 39 y.o. MRN: [id number]  CC:  Chief Complaint  Patient presents with  . Follow-up    weight gain;    HPI Stephanie Burch is a 39 year old female who presents for Follow Up today.    Patient Active Problem List   Diagnosis Date Noted  . Urinary frequency 04/20/2019  . Vaginal odor 04/20/2019  . Chest pain varying with breathing   . Fever and chills   . Anxiety 11/15/2018  . Vitamin D deficiency 11/15/2018  . Nausea 11/15/2018  . Abnormal pulse oximetry   . Hypokalemia 09/20/2018  . Medication management 09/14/2018  . Muscle spasms of both lower extremities 08/25/2018  . Subjective visual disturbance of right eye 08/25/2018  . 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 Chronic pain syndrome 03/16/2018  . Chronic, continuous use of opioids 03/16/2018  . Tachycardia with heart rate 100-120 beats per minute   . Sickle cell anemia with crisis (HCC) 10/19/2017  . Leukocytosis 10/19/2017  . Thrombocytopenia (HCC) 10/19/2017  . Morbidly obese (HCC) 10/19/2017  . Sickle cell pain crisis (HCC) 10/15/2017    Past Medical History:  Diagnosis Date  . Nausea   . Sickle cell anemia (HCC)   . Vitamin B12 deficiency 12/2018  . Vitamin D deficiency    Current Status: Since her last office visit, she is doing well with no complaints. She states that she has pain in her legs. She rates her pain today at 4/10. She has not had a hospital visit for Sickle Cell Crisis since 5/23/2021where she was treated and discharged 07/01/2019. She is currently taking all medications as prescribed and staying well hydrated. She reports occasional nausea, constipation, dizziness and headaches. Patient states that when she doubles up in [place], she only has to take once a day and does not have to take short-acting Oxycodone as often  on some day. Her anxiety is moderate today. She denies suicidal ideations, homicidal ideations, or auditory hallucinations. She denies fevers, chills, recent infections, weight loss, and night sweats. She has not had any visual changes, and falls. No chest pain, heart palpitations, cough and shortness of breath reported. Denies GI problems such as vomiting, and diarrhea. She has no reports of blood in stools, dysuria and hematuria. She is taking all medications as prescribed.   No past surgical history on file.  Family History  Problem Relation Age of Onset  . Hypertension Mother   . Sickle cell trait Mother   . Diabetes Father   . Sickle cell trait Father     Social History   Socioeconomic History  . Marital status: Single    Spouse name: Not on file  . Number of children: Not on file  . Years of education: Not on file  . Highest education level: Not on file  Occupational History  . Not on file  Tobacco Use  . Smoking status: Never Smoker  . Smokeless tobacco: Never Used  Vaping Use  . Vaping Use: Never used  Substance and Sexual Activity  . Alcohol use: Not Currently  . Drug use: Never  . Sexual activity: Yes    Birth control/protection: None  Other Topics Concern  . Not on file  Social History Narrative  . Not on file   Social Determinants of Health   Financial Resource Strain:   .  Difficulty of Paying Living Expenses:   Food Insecurity:   . Worried About Charity fundraiser in the Last Year:   . Arboriculturist in the Last Year:   Transportation Needs:   . Film/video editor (Medical):   Marland Kitchen Lack of Transportation (Non-Medical):   Physical Activity:   . Days of Exercise per Week:   . Minutes of Exercise per Session:   Stress:   . Feeling of Stress :   Social Connections:   . Frequency of Communication with Friends and Family:   . Frequency of Social Gatherings with Friends and Family:   . Attends Religious Services:   . Active Member of Clubs or  Organizations:   . Attends Archivist Meetings:   Marland Kitchen Marital Status:   Intimate Partner Violence:   . Fear of Current or Ex-Partner:   . Emotionally Abused:   Marland Kitchen Physically Abused:   . Sexually Abused:     Outpatient Medications Prior to Visit  Medication Sig Dispense Refill  . folic acid (FOLVITE) 1 MG tablet Take 1 tablet (1 mg total) by mouth daily. 30 tablet 11  . ibuprofen (ADVIL) 800 MG tablet TAKE 1 TABLET (800 MG TOTAL) BY MOUTH EVERY 8 (EIGHT) HOURS AS NEEDED FOR HEADACHE, MILD PAIN, MODERATE PAIN OR CRAMPING. 30 tablet 3  . naloxone (NARCAN) 0.4 MG/ML injection As needed 1 mL 0  . ondansetron (ZOFRAN) 4 MG tablet TAKE 1 TABLET BY MOUTH EVERY 8 HOURS AS NEEDED FOR NAUSEA AND VOMITING 30 tablet 11  . oxyCODONE ER (XTAMPZA ER) 13.5 MG C12A Take 13.5 mg by mouth every 12 (twelve) hours. 60 capsule 0  . Oxycodone HCl 20 MG TABS Take 1 tablet (20 mg total) by mouth every 4 (four) hours as needed (for pain). 90 tablet 0  . tiZANidine (ZANAFLEX) 4 MG capsule Take 1 capsule (4 mg total) by mouth 3 (three) times daily. 90 capsule 3  . vitamin B-12 (CYANOCOBALAMIN) 50 MCG tablet Take 1 tablet (50 mcg total) by mouth daily. 30 tablet 6  . Vitamin D, Ergocalciferol, (DRISDOL) 1.25 MG (50000 UT) CAPS capsule Take 1 capsule (50,000 Units total) by mouth every 7 (seven) days. 5 capsule 6  . voxelotor (OXBRYTA) 500 MG TABS tablet Take 1,500 mg by mouth daily. For Sickle Cell Anemia    . docusate sodium (COLACE) 100 MG capsule Take 1 capsule (100 mg total) by mouth 2 (two) times daily. (Patient not taking: Reported on 07/22/2019) 10 capsule 0  . DULoxetine (CYMBALTA) 20 MG capsule TAKE 1 CAPSULE BY MOUTH EVERY DAY (Patient not taking: No sig reported) 90 capsule 1  . hydroxyurea (HYDREA) 500 MG capsule Take 4 capsules (2,000 mg total) by mouth daily. May take with food to minimize GI side effects. (Patient not taking: Reported on 07/22/2019) 120 capsule 5  . prochlorperazine (COMPAZINE) 10 MG  tablet Take 1 tablet (10 mg total) by mouth every 6 (six) hours as needed for nausea or vomiting. Take if Ondansetron is not effective. (Patient not taking: Reported on 06/26/2019) 30 tablet 3   No facility-administered medications prior to visit.    No Known Allergies  ROS Review of Systems  Constitutional: Negative.   HENT: Negative.   Eyes: Negative.   Respiratory: Negative.   Cardiovascular: Negative.   Gastrointestinal: Positive for constipation (occasional ) and nausea (occasional ).  Endocrine: Negative.   Genitourinary: Negative.   Musculoskeletal: Positive for arthralgias (generalized joint pain).  Skin: Negative.   Allergic/Immunologic:  Negative.   Neurological: Positive for dizziness (occasional ) and headaches (occasional ).  Hematological: Negative.   Psychiatric/Behavioral: Negative.       Objective:    Physical Exam Vitals and nursing note reviewed.  Constitutional:      Appearance: Normal appearance. She is obese.  HENT:     Head: Normocephalic and atraumatic.     Nose: Nose normal.     Mouth/Throat:     Mouth: Mucous membranes are moist.  Cardiovascular:     Rate and Rhythm: Normal rate and regular rhythm.     Pulses: Normal pulses.     Heart sounds: Normal heart sounds.  Pulmonary:     Effort: Pulmonary effort is normal.     Breath sounds: Normal breath sounds.  Abdominal:     General: Bowel sounds are normal.     Palpations: Abdomen is soft.  Musculoskeletal:        General: Normal range of motion.     Cervical back: Normal range of motion and neck supple.  Skin:    General: Skin is warm and dry.  Neurological:     General: No focal deficit present.     Mental Status: She is alert and oriented to person, place, and time.  Psychiatric:        Mood and Affect: Mood normal.        Behavior: Behavior normal.     BP (!) 146/90 (BP Location: Right Arm, Patient Position: Sitting, Cuff Size: Large)   Pulse 92   Temp 98.4 F (36.9 C)   Ht 5'  5" (1.651 m)   Wt 293 lb (132.9 kg)   LMP 07/08/2019   SpO2 98%   BMI 48.76 kg/m  Wt Readings from Last 3 Encounters:  07/22/19 293 lb (132.9 kg)  06/26/19 265 lb (120.2 kg)  06/02/19 260 lb (117.9 kg)     Health Maintenance Due  Topic Date Due  . Hepatitis C Screening  Never done  . COVID-19 Vaccine (1) Never done  . TETANUS/TDAP  Never done  . PAP SMEAR-Modifier  Never done    There are no preventive care reminders to display for this patient.  Lab Results  Component Value Date   TSH 1.120 11/15/2018   Lab Results  Component Value Date   WBC 9.1 06/30/2019   HGB 10.5 (L) 06/30/2019   HCT 30.4 (L) 06/30/2019   MCV 71.9 (L) 06/30/2019   PLT 227 06/30/2019   Lab Results  Component Value Date   NA 139 06/27/2019   K 3.9 06/27/2019   CO2 23 06/27/2019   GLUCOSE 109 (H) 06/27/2019   BUN 17 06/27/2019   CREATININE 0.85 06/27/2019   BILITOT 0.7 06/27/2019   ALKPHOS 64 06/27/2019   AST 16 06/27/2019   ALT 14 06/27/2019   PROT 6.5 06/27/2019   ALBUMIN 3.4 (L) 06/27/2019   CALCIUM 8.4 (L) 06/27/2019   ANIONGAP 8 06/27/2019   Lab Results  Component Value Date   CHOL 84 (L) 11/15/2018   Lab Results  Component Value Date   HDL 33 (L) 11/15/2018   Lab Results  Component Value Date   LDLCALC 34 11/15/2018   Lab Results  Component Value Date   TRIG 84 11/15/2018   Lab Results  Component Value Date   CHOLHDL 2.5 11/15/2018   Lab Results  Component Value Date   HGBA1C 4.1 09/11/2017      Assessment & Plan:   1. Hospital discharge follow-up  2. Hb-SS disease  without crisis [facility]) Patient is inquiring to increase long-acting Oxycodone. She is doing well today r/t her chronic pain management, as hospital visits have decreased. We will continue to monitor. She will continue to take pain medications as prescribed; will continue to avoid extreme heat and cold; will continue to eat a healthy diet and drink at least 64 ounces of water daily; continue stool  softener as needed; will avoid colds and flu; will continue to get plenty of sleep and rest; will continue to avoid high stressful situations and remain infection free; will continue Folic Acid 1 mg daily to avoid sickle cell crisis. Continue to follow up with Hematologist as needed.   3. Chronic, continuous use of opioids  4. Chronic pain syndrome  5. Medication management  6. Muscle spasms of both lower extremities  7. Anxiety Stable today.   8. Nausea Stable today.   9. Follow up She will follow up in 2 months.   No orders of the defined types were placed in this encounter.   No orders of the defined types were placed in this encounter.   Referral Orders  No referral(s) requested today    [name],  MSN, FNP-BC [facility]/Internal Medicine/Sickle Cell Center [facility] [address] [place], [place] [address] [phone]- fax   Problem List Items Addressed This Visit      Other   Anxiety   Chronic pain syndrome   Chronic, continuous use of opioids   Hb-SS disease without crisis [facility]   Medication management   Muscle spasms of both lower extremities   Nausea    Other Visit Diagnoses    Hospital discharge follow-up    -  Primary   Follow up          No orders of the defined types were placed in this encounter.   Follow-up: No follow-ups on file.    Kallie Locks, FNP

## 2019-07-22 NOTE — Progress Notes (Signed)
Integrated Behavioral Health Note  Session Start time: 2:20   End Time: 3:00 Total Time: 40  Type of Service: Behavioral Health - Individual/Family Interpreter: No.   Interpreter Name & Language: none  Session #: 12/20  SUBJECTIVE: Stephanie Burch is a 39 y.o. female brought in by patient.  Pt./Family was referred by NP for:  stress precipitating sickle cell crisis, recently also for overuse of pain medications.. 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 Reduce symptoms of: anxiety and stress  2. Increase knowledge and/or ability of: coping skills and self-management skills  3. Demonstrate ability to: Increase healthy adjustment to current life circumstances  INTERVENTIONS: Interventions utilized:  Brief CBT Standardized Assessments completed: Not Needed  ASSESSMENT: Patient currently experiencing difficulty in managing sickle cell pain and is distressed by crises that lead to hospitalization. Patient has also been stressed about difficulties in intimate  relationships. Brief CBT today to address thoughts and feelings leading to unhealthy behaviors in relationships. Identified unhelpful thoughts.   Patient may benefit from continued supportive counseling including CBT and ACT approaches to challenging and thoughts and core beliefs about self, relationships, and pain.  PLAN: 1. Follow up with behavioral health clinician on: 2 weeks  2. Behavioral recommendations: mood journal 3. Referral(s): Integrated [employer] (In Clinic)  Abigail Butts, LCSW [facility] [phone]

## 2019-07-26 ENCOUNTER — Telehealth: Payer: Self-pay | Admitting: Family Medicine

## 2019-07-26 NOTE — Telephone Encounter (Signed)
Pt called regarding an increase in her dosage of  Xtampza. Can you please follow up with pt

## 2019-07-27 ENCOUNTER — Telehealth: Payer: Self-pay | Admitting: Family Medicine

## 2019-07-27 ENCOUNTER — Other Ambulatory Visit: Payer: Self-pay

## 2019-07-27 ENCOUNTER — Ambulatory Visit (INDEPENDENT_AMBULATORY_CARE_PROVIDER_SITE_OTHER): Payer: Medicaid Other | Admitting: Family Medicine

## 2019-07-27 DIAGNOSIS — Z79899 Other long term (current) drug therapy: Secondary | ICD-10-CM | POA: Diagnosis not present

## 2019-07-27 DIAGNOSIS — F419 Anxiety disorder, unspecified: Secondary | ICD-10-CM

## 2019-07-27 DIAGNOSIS — D571 Sickle-cell disease without crisis: Secondary | ICD-10-CM | POA: Diagnosis not present

## 2019-07-27 DIAGNOSIS — G894 Chronic pain syndrome: Secondary | ICD-10-CM

## 2019-07-27 DIAGNOSIS — F1190 Opioid use, unspecified, uncomplicated: Secondary | ICD-10-CM

## 2019-07-27 DIAGNOSIS — Z09 Encounter for follow-up examination after completed treatment for conditions other than malignant neoplasm: Secondary | ICD-10-CM

## 2019-07-27 NOTE — Progress Notes (Signed)
Virtual Visit via Telephone Note  I connected with Stephanie Burch on 07/27/19 at  3:20 PM EDT by telephone and verified that I am speaking with the correct person using two identifiers.   I discussed the limitations, risks, security and privacy concerns of performing an evaluation and management service by telephone and the availability of in person appointments. I also discussed with the patient that there may be a patient responsible charge related to this service. The patient expressed understanding and agreed to proceed.   History of Present Illness: No past surgical history on file.   Social History   Socioeconomic History  . Marital status: Single    Spouse name: Not on file  . Number of children: Not on file  . Years of education: Not on file  . Highest education level: Not on file  Occupational History  . Not on file  Tobacco Use  . Smoking status: Never Smoker  . Smokeless tobacco: Never Used  Vaping Use  . Vaping Use: Never used  Substance and Sexual Activity  . Alcohol use: Not Currently  . Drug use: Never  . Sexual activity: Yes    Birth control/protection: None  Other Topics Concern  . Not on file  Social History Narrative  . Not on file   Social Determinants of Health   Financial Resource Strain:   . Difficulty of Paying Living Expenses:   Food Insecurity:   . Worried About Charity fundraiser in the Last Year:   . Arboriculturist in the Last Year:   Transportation Needs:   . Film/video editor (Medical):   Marland Kitchen Lack of Transportation (Non-Medical):   Physical Activity:   . Days of Exercise per Week:   . Minutes of Exercise per Session:   Stress:   . Feeling of Stress :   Social Connections:   . Frequency of Communication with Friends and Family:   . Frequency of Social Gatherings with Friends and Family:   . Attends Religious Services:   . Active Member of Clubs or Organizations:   . Attends Archivist Meetings:   Marland Kitchen Marital Status:    Intimate Partner Violence:   . Fear of Current or Ex-Partner:   . Emotionally Abused:   Marland Kitchen Physically Abused:   . Sexually Abused:     Past Medical History:  Diagnosis Date  . Nausea   . Sickle cell anemia (HCC)   . Vitamin B12 deficiency 12/2018  . Vitamin D deficiency    No Known Allergies   Patient Active Problem List   Diagnosis Date Noted  . Urinary frequency 04/20/2019  . Vaginal odor 04/20/2019  . Chest pain varying with breathing   . Fever and chills   . Anxiety 11/15/2018  . Vitamin D deficiency 11/15/2018  . Nausea 11/15/2018  . Abnormal pulse oximetry   . Hypokalemia 09/20/2018  . Medication management 09/14/2018  . Muscle spasms of both lower extremities 08/25/2018  . Subjective visual disturbance of right eye 08/25/2018  . 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 Chronic pain syndrome 03/16/2018  . Chronic, continuous use of opioids 03/16/2018  . Tachycardia with heart rate 100-120 beats per minute   . Sickle cell anemia with crisis ([place]) 10/19/2017  . Leukocytosis 10/19/2017  . Thrombocytopenia ([place]) 10/19/2017  . Morbidly obese ([place]) 10/19/2017  . Sickle cell pain crisis ([place]) 10/15/2017      Observations/Objective: Telephone Virtual Visit  Assessment and Plan:  1. Medication management  2. Hb-SS disease without crisis [facility]) She is doing well today r/t her chronic pain management with fewer ED visits over the past few months. She will continue to take pain medications as prescribed; will continue to avoid extreme heat and cold; will continue to eat a healthy diet and drink at least 64 ounces of water daily; continue stool softener as needed; will avoid colds and flu; will continue to get plenty of sleep and rest; will continue to avoid high stressful situations and remain infection free; will continue Folic Acid 1 mg daily to avoid sickle cell crisis. Continue to follow up with Hematologist as needed.   3. Chronic pain syndrome  4.  Chronic, continuous use of opioids  5. Anxiety Moderate today r/t strategies for pain management.   6. Follow up She will follow up in 2 months.  No orders of the defined types were placed in this encounter.   No orders of the defined types were placed in this encounter.   Referral Orders  No referral(s) requested today    [name],  MSN, FNP-BC [facility]/Internal Medicine/[facility] [address] [place], [place] [address] [phone] fax    I discussed the assessment and treatment plan with the patient. The patient was provided an opportunity to ask questions and all were answered. The patient agreed with the plan and demonstrated an understanding of the instructions.   The patient was advised to call back or seek an in-person evaluation if the symptoms worsen or if the condition fails to improve as anticipated.  I provided 20 minutes of non-face-to-face time during this encounter.   Kallie Locks, FNP

## 2019-07-27 NOTE — Telephone Encounter (Signed)
Pt called in refill on oxycodone and xtampza

## 2019-07-28 ENCOUNTER — Encounter (HOSPITAL_COMMUNITY): Payer: Self-pay | Admitting: Internal Medicine

## 2019-07-28 ENCOUNTER — Other Ambulatory Visit: Payer: Self-pay | Admitting: Family Medicine

## 2019-07-28 ENCOUNTER — Telehealth (HOSPITAL_COMMUNITY): Payer: Self-pay | Admitting: *Deleted

## 2019-07-28 ENCOUNTER — Non-Acute Institutional Stay (HOSPITAL_COMMUNITY)
Admission: AD | Admit: 2019-07-28 | Discharge: 2019-07-28 | Disposition: A | Discharge status: Home / Self Care | Payer: Medicaid Other | Source: Ambulatory Visit | Attending: Internal Medicine | Admitting: Internal Medicine

## 2019-07-28 DIAGNOSIS — F1120 Opioid dependence, uncomplicated: Secondary | ICD-10-CM | POA: Diagnosis not present

## 2019-07-28 DIAGNOSIS — D571 Sickle-cell disease without crisis: Secondary | ICD-10-CM

## 2019-07-28 DIAGNOSIS — D57219 Sickle-cell/Hb-C disease with crisis, unspecified: Secondary | ICD-10-CM | POA: Insufficient documentation

## 2019-07-28 DIAGNOSIS — F329 Major depressive disorder, single episode, unspecified: Secondary | ICD-10-CM | POA: Insufficient documentation

## 2019-07-28 DIAGNOSIS — G894 Chronic pain syndrome: Secondary | ICD-10-CM | POA: Insufficient documentation

## 2019-07-28 DIAGNOSIS — Z79899 Other long term (current) drug therapy: Secondary | ICD-10-CM | POA: Insufficient documentation

## 2019-07-28 DIAGNOSIS — M62838 Other muscle spasm: Secondary | ICD-10-CM

## 2019-07-28 DIAGNOSIS — F419 Anxiety disorder, unspecified: Secondary | ICD-10-CM | POA: Diagnosis not present

## 2019-07-28 DIAGNOSIS — D5700 Hb-SS disease with crisis, unspecified: Secondary | ICD-10-CM | POA: Diagnosis present

## 2019-07-28 DIAGNOSIS — D638 Anemia in other chronic diseases classified elsewhere: Secondary | ICD-10-CM | POA: Insufficient documentation

## 2019-07-28 DIAGNOSIS — D5720 Sickle-cell/Hb-C disease without crisis: Secondary | ICD-10-CM | POA: Diagnosis present

## 2019-07-28 LAB — CBC WITH DIFFERENTIAL/PLATELET
Abs Immature Granulocytes: 0.02 K/uL (ref 0.00–0.07)
Basophils Absolute: 0 K/uL (ref 0.0–0.1)
Basophils Relative: 0 %
Eosinophils Absolute: 0.1 K/uL (ref 0.0–0.5)
Eosinophils Relative: 1 %
HCT: 45.2 % (ref 36.0–46.0)
Hemoglobin: 15.8 g/dL — ABNORMAL HIGH (ref 12.0–15.0)
Immature Granulocytes: 0 %
Lymphocytes Relative: 23 %
Lymphs Abs: 2.2 K/uL (ref 0.7–4.0)
MCH: 24.6 pg — ABNORMAL LOW (ref 26.0–34.0)
MCHC: 35 g/dL (ref 30.0–36.0)
MCV: 70.4 fL — ABNORMAL LOW (ref 80.0–100.0)
Monocytes Absolute: 0.7 K/uL (ref 0.1–1.0)
Monocytes Relative: 7 %
Neutro Abs: 6.6 K/uL (ref 1.7–7.7)
Neutrophils Relative %: 69 %
Platelets: 287 K/uL (ref 150–400)
RBC: 6.42 MIL/uL — ABNORMAL HIGH (ref 3.87–5.11)
RDW: 14.9 % (ref 11.5–15.5)
WBC: 9.7 K/uL (ref 4.0–10.5)
nRBC: 0 % (ref 0.0–0.2)

## 2019-07-28 LAB — COMPREHENSIVE METABOLIC PANEL
ALT: 28 U/L (ref 0–44)
AST: 14 U/L — ABNORMAL LOW (ref 15–41)
Albumin: 4.3 g/dL (ref 3.5–5.0)
Alkaline Phosphatase: 71 U/L (ref 38–126)
Anion gap: 11 (ref 5–15)
BUN: 8 mg/dL (ref 6–20)
CO2: 19 mmol/L — ABNORMAL LOW (ref 22–32)
Calcium: 9 mg/dL (ref 8.9–10.3)
Chloride: 108 mmol/L (ref 98–111)
Creatinine, Ser: 0.79 mg/dL (ref 0.44–1.00)
GFR calc Af Amer: >60 mL/min (ref >60)
GFR calc non Af Amer: >60 mL/min (ref >60)
Glucose, Bld: 120 mg/dL — ABNORMAL HIGH (ref 70–99)
Potassium: 3.8 mmol/L (ref 3.5–5.1)
Sodium: 138 mmol/L (ref 135–145)
Total Bilirubin: 0.8 mg/dL (ref 0.3–1.2)
Total Protein: 8.1 g/dL (ref 6.5–8.1)

## 2019-07-28 LAB — RETICULOCYTES
Immature Retic Fract: 15.7 % (ref 2.3–15.9)
RBC.: 6.51 MIL/uL — ABNORMAL HIGH (ref 3.87–5.11)
Retic Count, Absolute: 110 K/uL (ref 19.0–186.0)
Retic Ct Pct: 1.7 % (ref 0.4–3.1)

## 2019-07-28 MED ORDER — ACETAMINOPHEN 500 MG PO TABS
1000.0000 mg | ORAL_TABLET | Freq: Once | ORAL | Status: AC
  Administered 2019-07-28: 1000 mg via ORAL
  Filled 2019-07-28: qty 2

## 2019-07-28 MED ORDER — HYDROMORPHONE HCL 2 MG/ML IJ SOLN
2.0000 mg | Freq: Once | INTRAMUSCULAR | Status: AC
  Administered 2019-07-28: 2 mg via SUBCUTANEOUS
  Filled 2019-07-28: qty 1

## 2019-07-28 MED ORDER — OXYCODONE HCL 5 MG PO TABS
20.0000 mg | ORAL_TABLET | Freq: Once | ORAL | Status: AC
  Administered 2019-07-28: 20 mg via ORAL
  Filled 2019-07-28: qty 4

## 2019-07-28 MED ORDER — OXYCODONE HCL ER 15 MG PO T12A
15.0000 mg | EXTENDED_RELEASE_TABLET | Freq: Once | ORAL | Status: AC
  Administered 2019-07-28: 15 mg via ORAL

## 2019-07-28 MED ORDER — OXYCODONE HCL 5 MG PO TABS: 20.0000 mg | ORAL_TABLET | Freq: Once | ORAL | Status: DC

## 2019-07-28 MED ORDER — ONDANSETRON HCL 4 MG PO TABS
4.0000 mg | ORAL_TABLET | Freq: Once | ORAL | Status: AC
  Administered 2019-07-28: 4 mg via ORAL
  Filled 2019-07-28: qty 1

## 2019-07-28 MED ORDER — IBUPROFEN 400 MG PO TABS
600.0000 mg | ORAL_TABLET | Freq: Once | ORAL | Status: AC
  Administered 2019-07-28: 600 mg via ORAL
  Filled 2019-07-28: qty 1

## 2019-07-28 NOTE — Progress Notes (Signed)
Patient admitted to the day infusion hospital for sickle cell pain. Initially, patient reported bilateral leg pain rated 7/10. For pain management, patient given 2 mg Dilaudid sub-q, Tylenol, Ibuprofen, Oxycodone and Oxycontin. Patient encouraged to hydrate with 64 ounces of fluids. At discharge, patient rated pain at 4/10. Discharge instructions given. Patient alert, oriented and ambulatory at discharge.

## 2019-07-28 NOTE — Discharge Instructions (Signed)
Sickle Cell Anemia, Adult  Sickle cell anemia is a condition where your red blood cells are shaped like sickles. Red blood cells carry oxygen through the body. Sickle-shaped cells do not live as long as normal red blood cells. They also clump together and block blood from flowing through the blood vessels. This prevents the body from getting enough oxygen. Sickle cell anemia causes organ damage and pain. It also increases the risk of infection. Follow these instructions at home: Medicines  Take over-the-counter and prescription medicines only as told by your doctor.  If you were prescribed an antibiotic medicine, take it as told by your doctor. Do not stop taking the antibiotic even if you start to feel better.  If you develop a fever, do not take medicines to lower the fever right away. Tell your doctor about the fever. Managing pain, stiffness, and swelling  Try these methods to help with pain: ? Use a heating pad. ? Take a warm bath. ? Distract yourself, such as by watching TV. Eating and drinking  Drink enough fluid to keep your pee (urine) clear or pale yellow. Drink more in hot weather and during exercise.  Limit or avoid alcohol.  Eat a healthy diet. Eat plenty of fruits, vegetables, whole grains, and lean protein.  Take vitamins and supplements as told by your doctor. Traveling  When traveling, keep these with you: ? Your medical information. ? The names of your doctors. ? Your medicines.  If you need to take an airplane, talk to your doctor first. Activity  Rest often.  Avoid exercises that make your heart beat much faster, such as jogging. General instructions  Do not use products that have nicotine or tobacco, such as cigarettes and e-cigarettes. If you need help quitting, ask your doctor.  Consider wearing a medical alert bracelet.  Avoid being in high places (high altitudes), such as mountains.  Avoid very hot or cold temperatures.  Avoid places where the  temperature changes a lot.  Keep all follow-up visits as told by your doctor. This is important. Contact a doctor if:  A joint hurts.  Your feet or hands hurt or swell.  You feel tired (fatigued). Get help right away if:  You have symptoms of infection. These include: ? Fever. ? Chills. ? Being very tired. ? Irritability. ? Poor eating. ? Throwing up (vomiting).  You feel dizzy or faint.  You have new stomach pain, especially on the left side.  You have a an erection (priapism) that lasts more than 4 hours.  You have numbness in your arms or legs.  You have a hard time moving your arms or legs.  You have trouble talking.  You have pain that does not go away when you take medicine.  You are short of breath.  You are breathing fast.  You have a long-term cough.  You have pain in your chest.  You have a bad headache.  You have a stiff neck.  Your stomach looks bloated even though you did not eat much.  Your skin is pale.  You suddenly cannot see well. Summary  Sickle cell anemia is a condition where your red blood cells are shaped like sickles.  Follow your doctor's advice on ways to manage pain, food to eat, activities to do, and steps to take for safe travel.  Get medical help right away if you have any signs of infection, such as a fever. This information is not intended to replace advice given to you by   your health care provider. Make sure you discuss any questions you have with your health care provider. Document Revised: 05/14/2018 Document Reviewed: 02/26/2016 Elsevier Patient Education  2020 [employer].  

## 2019-07-28 NOTE — H&P (Signed)
[facility] History and Physical   Date: 07/30/2019  Patient name: Stephanie Burch Medical record number: [id number] Date of birth: [birth year] Age: 39 y.o. Gender: female PCP: Kallie Locks, FNP  Attending physician: No att. providers found  Chief Complaint: Sickle cell pain  History of Present Illness: Stephanie Burch is a 39 year old female with a medical history significant for sickle cell disease type [place], chronic pain syndrome, opiate dependence and tolerance, history of anemia of chronic disease, depression anxiety, and morbid obesity presents complaining of right lower extremity pain that is consistent with previous pain crises.  Patient states that pain intensity has been increased over the past 24 hours.  She attributes increased pain to running out of short acting pain medication.  Patient typically takes oxycodone 20 mg every 6 hours for severe pain.  She admits to taking more medication than prescribed for pain relief.  Current pain intensity is 8/10 characterized as constant and aching.  She denies fever, chills, headache, urinary symptoms, nausea, vomiting, or diarrhea.  Meds: No medications prior to admission.    Allergies: Patient has no known allergies. Past Medical History:  Diagnosis Date  . Nausea   . Sickle cell anemia (HCC)   . Vitamin B12 deficiency 12/2018  . Vitamin D deficiency    History reviewed. No pertinent surgical history. Family History  Problem Relation Age of Onset  . Hypertension Mother   . Sickle cell trait Mother   . Diabetes Father   . Sickle cell trait Father    Social History   Socioeconomic History  . Marital status: Single    Spouse name: Not on file  . Number of children: Not on file  . Years of education: Not on file  . Highest education level: Not on file  Occupational History  . Not on file  Tobacco Use  . Smoking status: Never Smoker  . Smokeless tobacco: Never Used  Vaping Use  . Vaping Use: Never used   Substance and Sexual Activity  . Alcohol use: Not Currently  . Drug use: Never  . Sexual activity: Yes    Birth control/protection: None  Other Topics Concern  . Not on file  Social History Narrative  . Not on file   Social Determinants of Health   Financial Resource Strain:   . Difficulty of Paying Living Expenses:   Food Insecurity:   . Worried About Programme researcher, broadcasting/film/video in the Last Year:   . Barista in the Last Year:   Transportation Needs:   . Freight forwarder (Medical):   Marland Kitchen Lack of Transportation (Non-Medical):   Physical Activity:   . Days of Exercise per Week:   . Minutes of Exercise per Session:   Stress:   . Feeling of Stress :   Social Connections:   . Frequency of Communication with Friends and Family:   . Frequency of Social Gatherings with Friends and Family:   . Attends Religious Services:   . Active Member of Clubs or Organizations:   . Attends Banker Meetings:   Marland Kitchen Marital Status:   Intimate Partner Violence:   . Fear of Current or Ex-Partner:   . Emotionally Abused:   Marland Kitchen Physically Abused:   . Sexually Abused:     Review of Systems: Review of Systems  Constitutional: Negative.   HENT: Negative.   Eyes: Negative.   Respiratory: Negative.   Cardiovascular: Negative.   Gastrointestinal: Negative.   Musculoskeletal: Positive for joint  pain.  Skin: Negative.   Neurological: Negative.   Psychiatric/Behavioral: Negative.      Physical Exam: Blood pressure 138/81, pulse 91, temperature 98.2 F (36.8 C), temperature source Temporal, resp. rate 16, last menstrual period 07/08/2019, SpO2 99 %. Physical Exam Cardiovascular:     Rate and Rhythm: Normal rate and regular rhythm.  Pulmonary:     Effort: Pulmonary effort is normal.     Breath sounds: Normal breath sounds.  Abdominal:     General: Abdomen is flat. Bowel sounds are normal.  Skin:    General: Skin is warm.  Neurological:     General: No focal deficit present.      Mental Status: She is alert. Mental status is at baseline.  Psychiatric:        Mood and Affect: Mood normal.        Behavior: Behavior normal.        Thought Content: Thought content normal.        Judgment: Judgment normal.      Lab results: No results found for this or any previous visit (from the past 24 hour(s)).  Imaging results:  No results found.   Assessment & Plan Patient admitted to day infusion center for management of sickle cell pain crisis versus chronic pain exacerbation. Hydrate with 64 ounces of fluid Tylenol 1000 mg by mouth x1 Oxycodone 20 mg x 1 Dilaudid 2 mg SQ x1 OxyContin 15 mg x 1 Review CBC with differential, complete metabolic panel, and reticulocytes as results become available. Pain will be reevaluated in context of function and relationship to baseline as care progresses If pain intensity remains elevated, consider transitioning to inpatient services for further management   [name]  APRN, MSN, FNP-C Patient [place] [address] [place], [place] [address] [phone] 07/30/2019, 11:01 AM

## 2019-07-28 NOTE — Telephone Encounter (Signed)
Patient called requesting to come to the day hospital for sickle cell pain. Patient reports bilateral leg pain rated 7/10. Reports being out of prescribed pain medications. Patient can refill Oxycodone tomorrow but will have to wait "a few days" before she can get a refill on her Xtampza.  COVID-19 screening done and patient denies all symptoms and exposures. Denies fever, chest pain, nausea, vomiting, diarrhea and abdominal pain. Patient admits that she does not having transportation without driving herself. [place], FNP notified and advised patient that she can come to the day hospital for pain management. Patient advised that she will only be getting her home medications while in the day hospital since she will be driving herself. Patient advised and expresses an understanding.

## 2019-07-29 ENCOUNTER — Other Ambulatory Visit: Payer: Self-pay | Admitting: Family Medicine

## 2019-07-29 DIAGNOSIS — D571 Sickle-cell disease without crisis: Secondary | ICD-10-CM

## 2019-07-29 DIAGNOSIS — G894 Chronic pain syndrome: Secondary | ICD-10-CM

## 2019-07-29 DIAGNOSIS — F1190 Opioid use, unspecified, uncomplicated: Secondary | ICD-10-CM

## 2019-07-29 DIAGNOSIS — M62838 Other muscle spasm: Secondary | ICD-10-CM

## 2019-07-29 MED ORDER — OXYCODONE HCL 20 MG PO TABS: 20.0000 mg | ORAL_TABLET | ORAL | 0 refills | Status: DC | PRN

## 2019-07-30 NOTE — Discharge Summary (Signed)
[facility] Discharge Summary   Patient ID: Stephanie Burch MRN: [id number] DOB/AGE: 05/01/1980 39 y.o.  Admit date: 07/28/2019 Discharge date: 07/30/2019  Primary Care Physician:  Azzie Glatter, FNP  Admission Diagnoses:  Principal Problem:   Sickle cell pain crisis [facility])   Discharge Medications:  Allergies as of 07/28/2019   No Known Allergies     Medication List    TAKE these medications   docusate sodium 100 MG capsule Commonly known as: Colace Take 1 capsule (100 mg total) by mouth 2 (two) times daily.   DULoxetine 20 MG capsule Commonly known as: CYMBALTA TAKE 1 CAPSULE BY MOUTH EVERY DAY   folic acid 1 MG tablet Commonly known as: FOLVITE Take 1 tablet (1 mg total) by mouth daily.   hydroxyurea 500 MG capsule Commonly known as: Hydrea Take 4 capsules (2,000 mg total) by mouth daily. May take with food to minimize GI side effects.   ibuprofen 800 MG tablet Commonly known as: ADVIL TAKE 1 TABLET (800 MG TOTAL) BY MOUTH EVERY 8 (EIGHT) HOURS AS NEEDED FOR HEADACHE, MILD PAIN, MODERATE PAIN OR CRAMPING.   naloxone 0.4 MG/ML injection Commonly known as: NARCAN As needed   ondansetron 4 MG tablet Commonly known as: ZOFRAN TAKE 1 TABLET BY MOUTH EVERY 8 HOURS AS NEEDED FOR NAUSEA AND VOMITING   Oxbryta 500 MG Tabs tablet Generic drug: voxelotor Take 1,500 mg by mouth daily. For Sickle Cell Anemia   prochlorperazine 10 MG tablet Commonly known as: COMPAZINE Take 1 tablet (10 mg total) by mouth every 6 (six) hours as needed for nausea or vomiting. Take if Ondansetron is not effective.   tiZANidine 4 MG capsule Commonly known as: ZANAFLEX Take 1 capsule (4 mg total) by mouth 3 (three) times daily.   Vitamin D (Ergocalciferol) 1.25 MG (50000 UNIT) Caps capsule Commonly known as: DRISDOL Take 1 capsule (50,000 Units total) by mouth every 7 (seven) days.   Xtampza ER 13.5 MG C12a Generic drug: oxyCODONE ER Take 13.5 mg by mouth every 12  (twelve) hours.        Consults:  None  Significant Diagnostic Studies:  No results found.  History of present illness:  Stephanie Burch is a 39 year old female with a medical history significant for sickle cell disease type [place], chronic pain syndrome, opiate dependence and tolerance, history of anemia of chronic disease, depression anxiety, and morbid obesity presents complaining of right lower extremity pain that is consistent with previous pain crises.  Patient states that pain intensity has been increased over the past 24 hours.  She attributes increased pain to running out of short acting pain medication.  Patient typically takes oxycodone 20 mg every 6 hours for severe pain.  She admits to taking more medication than prescribed for pain relief.  Current pain intensity is 8/10 characterized as constant and aching.  She denies fever, chills, headache, urinary symptoms, nausea, vomiting, or diarrhea.  [facility] Course: Patient admitted to sickle cell day infusion center for management pain crisis. All laboratory values reviewed, consistent with patient's baseline. Pain managed with Dilaudid 2 mg SQ x1. Hydrated with 64 ounces of fluid Oxycodone 20 mg x 1 OxyContin 15 mg x 1 Tylenol 1000 mg x 1 Pain much improved.  Pain intensity is 4/10.  Patient states that she can manage at home.  She will be able to pick up prescription opiates on 07/29/2019. Patient advised to follow-up with PCP as scheduled. She is alert, oriented, and ambulating without  assistance. Patient will discharge home in a hemodynamically stable condition.  Discharge instructions: Resume all home medications.   Follow up with PCP as previously  scheduled.   Discussed the importance of drinking 64 ounces of water daily, dehydration of red blood cells may lead further sickling.   Avoid all stressors that precipitate sickle cell pain crisis.     The patient was given clear instructions to go to ER or  return to medical center if symptoms do not improve, worsen or new problems develop.   Physical Exam at Discharge:  BP 138/81 (BP Location: Left Arm)   Pulse 91   Temp 98.2 F (36.8 C) (Temporal)   Resp 16   LMP 07/08/2019   SpO2 99%   Physical Exam Constitutional:      Appearance: Normal appearance.  HENT:     Nose: Nose normal.  Eyes:     Pupils: Pupils are equal, round, and reactive to light.  Cardiovascular:     Rate and Rhythm: Normal rate and regular rhythm.     Pulses: Normal pulses.  Pulmonary:     Effort: Pulmonary effort is normal.  Abdominal:     General: Abdomen is flat. Bowel sounds are normal.  Musculoskeletal:        General: Normal range of motion.  Skin:    General: Skin is warm.  Neurological:     General: No focal deficit present.     Mental Status: She is alert. Mental status is at baseline.  Psychiatric:        Mood and Affect: Mood normal.        Behavior: Behavior normal.        Thought Content: Thought content normal.        Judgment: Judgment normal.     Disposition at Discharge: Discharge disposition: 01-Home or Self Care       Discharge Orders: Discharge Instructions    Discharge patient   Complete by: As directed    Discharge disposition: 01-Home or Self Care   Discharge patient date: 07/28/2019      Condition at Discharge:   Stable  Time spent on Discharge:  Greater than 30 minutes.  Signed: Nolon Nations  APRN, MSN, FNP-C Patient Care [facility] [address] [place], [place] [address] [phone]  07/30/2019, 11:10 AM

## 2019-08-01 ENCOUNTER — Telehealth: Payer: Self-pay | Admitting: Family Medicine

## 2019-08-01 ENCOUNTER — Other Ambulatory Visit: Payer: Self-pay | Admitting: Family Medicine

## 2019-08-01 DIAGNOSIS — G894 Chronic pain syndrome: Secondary | ICD-10-CM

## 2019-08-01 DIAGNOSIS — D571 Sickle-cell disease without crisis: Secondary | ICD-10-CM

## 2019-08-01 DIAGNOSIS — F1190 Opioid use, unspecified, uncomplicated: Secondary | ICD-10-CM

## 2019-08-01 MED ORDER — XTAMPZA ER 18 MG PO C12A: 18.0000 mg | EXTENDED_RELEASE_CAPSULE | Freq: Two times a day (BID) | ORAL | 0 refills | Status: DC

## 2019-08-01 NOTE — Telephone Encounter (Signed)
Done

## 2019-08-02 ENCOUNTER — Other Ambulatory Visit: Payer: Self-pay | Admitting: Family Medicine

## 2019-08-02 DIAGNOSIS — M62838 Other muscle spasm: Secondary | ICD-10-CM

## 2019-08-02 DIAGNOSIS — D571 Sickle-cell disease without crisis: Secondary | ICD-10-CM

## 2019-08-03 ENCOUNTER — Ambulatory Visit: Payer: Self-pay | Admitting: Clinical

## 2019-08-03 ENCOUNTER — Other Ambulatory Visit: Payer: Self-pay | Admitting: Family Medicine

## 2019-08-03 DIAGNOSIS — D571 Sickle-cell disease without crisis: Secondary | ICD-10-CM

## 2019-08-03 DIAGNOSIS — M62838 Other muscle spasm: Secondary | ICD-10-CM

## 2019-08-09 ENCOUNTER — Telehealth: Payer: Self-pay | Admitting: Family Medicine

## 2019-08-09 NOTE — Telephone Encounter (Signed)
Pt called in refill on oxycodone 

## 2019-08-10 ENCOUNTER — Telehealth (HOSPITAL_COMMUNITY): Payer: Self-pay | Admitting: *Deleted

## 2019-08-10 ENCOUNTER — Encounter (HOSPITAL_COMMUNITY): Payer: Self-pay | Admitting: Internal Medicine

## 2019-08-10 ENCOUNTER — Ambulatory Visit: Payer: Self-pay | Admitting: Clinical

## 2019-08-10 ENCOUNTER — Inpatient Hospital Stay (HOSPITAL_COMMUNITY)
Admission: AD | Admit: 2019-08-10 | Discharge: 2019-08-14 | DRG: 812 | Disposition: A | Discharge status: Home / Self Care | Payer: Medicaid Other | Source: Ambulatory Visit | Attending: Internal Medicine | Admitting: Internal Medicine

## 2019-08-10 ENCOUNTER — Other Ambulatory Visit: Payer: Self-pay

## 2019-08-10 DIAGNOSIS — Z6841 Body Mass Index (BMI) 40.0 and over, adult: Secondary | ICD-10-CM | POA: Diagnosis not present

## 2019-08-10 DIAGNOSIS — Z833 Family history of diabetes mellitus: Secondary | ICD-10-CM | POA: Diagnosis not present

## 2019-08-10 DIAGNOSIS — Z8249 Family history of ischemic heart disease and other diseases of the circulatory system: Secondary | ICD-10-CM

## 2019-08-10 DIAGNOSIS — F419 Anxiety disorder, unspecified: Secondary | ICD-10-CM | POA: Diagnosis not present

## 2019-08-10 DIAGNOSIS — F329 Major depressive disorder, single episode, unspecified: Secondary | ICD-10-CM | POA: Diagnosis present

## 2019-08-10 DIAGNOSIS — D5700 Hb-SS disease with crisis, unspecified: Secondary | ICD-10-CM | POA: Diagnosis not present

## 2019-08-10 DIAGNOSIS — Z832 Family history of diseases of the blood and blood-forming organs and certain disorders involving the immune mechanism: Secondary | ICD-10-CM

## 2019-08-10 DIAGNOSIS — F1120 Opioid dependence, uncomplicated: Secondary | ICD-10-CM | POA: Diagnosis present

## 2019-08-10 DIAGNOSIS — D72829 Elevated white blood cell count, unspecified: Secondary | ICD-10-CM | POA: Diagnosis not present

## 2019-08-10 DIAGNOSIS — Z20822 Contact with and (suspected) exposure to covid-19: Secondary | ICD-10-CM | POA: Diagnosis present

## 2019-08-10 DIAGNOSIS — G894 Chronic pain syndrome: Secondary | ICD-10-CM | POA: Diagnosis not present

## 2019-08-10 LAB — COMPREHENSIVE METABOLIC PANEL
ALT: 36 U/L (ref 0–44)
AST: 23 U/L (ref 15–41)
Albumin: 3.8 g/dL (ref 3.5–5.0)
Alkaline Phosphatase: 76 U/L (ref 38–126)
Anion gap: 12 (ref 5–15)
BUN: 11 mg/dL (ref 6–20)
CO2: 25 mmol/L (ref 22–32)
Calcium: 9.1 mg/dL (ref 8.9–10.3)
Chloride: 101 mmol/L (ref 98–111)
Creatinine, Ser: 0.58 mg/dL (ref 0.44–1.00)
GFR calc Af Amer: >60 mL/min (ref >60)
GFR calc non Af Amer: >60 mL/min (ref >60)
Glucose, Bld: 103 mg/dL — ABNORMAL HIGH (ref 70–99)
Potassium: 4.6 mmol/L (ref 3.5–5.1)
Sodium: 138 mmol/L (ref 135–145)
Total Bilirubin: 1.2 mg/dL (ref 0.3–1.2)
Total Protein: 7.3 g/dL (ref 6.5–8.1)

## 2019-08-10 LAB — CBC WITH DIFFERENTIAL/PLATELET
Abs Immature Granulocytes: 0.09 10*3/uL — ABNORMAL HIGH (ref 0.00–0.07)
Basophils Absolute: 0.1 10*3/uL (ref 0.0–0.1)
Basophils Relative: 1 %
Eosinophils Absolute: 0.5 10*3/uL (ref 0.0–0.5)
Eosinophils Relative: 4 %
HCT: 32.5 % — ABNORMAL LOW (ref 36.0–46.0)
Hemoglobin: 11.1 g/dL — ABNORMAL LOW (ref 12.0–15.0)
Immature Granulocytes: 1 %
Lymphocytes Relative: 21 %
Lymphs Abs: 2.7 10*3/uL (ref 0.7–4.0)
MCH: 24.3 pg — ABNORMAL LOW (ref 26.0–34.0)
MCHC: 34.2 g/dL (ref 30.0–36.0)
MCV: 71.3 fL — ABNORMAL LOW (ref 80.0–100.0)
Monocytes Absolute: 1.3 10*3/uL — ABNORMAL HIGH (ref 0.1–1.0)
Monocytes Relative: 10 %
Neutro Abs: 8.4 10*3/uL — ABNORMAL HIGH (ref 1.7–7.7)
Neutrophils Relative %: 63 %
Platelets: 185 10*3/uL (ref 150–400)
RBC: 4.56 MIL/uL (ref 3.87–5.11)
RDW: 15.2 % (ref 11.5–15.5)
WBC: 13 10*3/uL — ABNORMAL HIGH (ref 4.0–10.5)
nRBC: 3.2 % — ABNORMAL HIGH (ref 0.0–0.2)

## 2019-08-10 LAB — RAPID URINE DRUG SCREEN, HOSP PERFORMED
Amphetamines: NOT DETECTED
Barbiturates: NOT DETECTED
Benzodiazepines: NOT DETECTED
Cocaine: NOT DETECTED
Opiates: POSITIVE — AB
Tetrahydrocannabinol: NOT DETECTED

## 2019-08-10 LAB — PREGNANCY, URINE: Preg Test, Ur: NEGATIVE

## 2019-08-10 LAB — RETICULOCYTES
Immature Retic Fract: 38.3 % — ABNORMAL HIGH (ref 2.3–15.9)
RBC.: 4.54 MIL/uL (ref 3.87–5.11)
Retic Count, Absolute: 229.3 10*3/uL — ABNORMAL HIGH (ref 19.0–186.0)
Retic Ct Pct: 5.1 % — ABNORMAL HIGH (ref 0.4–3.1)

## 2019-08-10 MED ORDER — SODIUM CHLORIDE 0.9% FLUSH: 9.0000 mL | INTRAVENOUS | Status: DC | PRN

## 2019-08-10 MED ORDER — ONDANSETRON HCL 4 MG/2ML IJ SOLN
4.0000 mg | Freq: Four times a day (QID) | INTRAMUSCULAR | Status: DC | PRN
  Administered 2019-08-13: 4 mg via INTRAVENOUS
  Filled 2019-08-10: qty 2

## 2019-08-10 MED ORDER — ENOXAPARIN SODIUM 40 MG/0.4ML ~~LOC~~ SOLN
40.0000 mg | SUBCUTANEOUS | Status: DC
  Administered 2019-08-10 – 2019-08-13 (×4): 40 mg via SUBCUTANEOUS
  Filled 2019-08-10 (×4): qty 0.4

## 2019-08-10 MED ORDER — ACETAMINOPHEN 500 MG PO TABS
1000.0000 mg | ORAL_TABLET | Freq: Once | ORAL | Status: AC
  Administered 2019-08-10: 1000 mg via ORAL
  Filled 2019-08-10: qty 2

## 2019-08-10 MED ORDER — VOXELOTOR 500 MG PO TABS: 1500.0000 mg | ORAL_TABLET | Freq: Every day | ORAL | Status: DC

## 2019-08-10 MED ORDER — DIPHENHYDRAMINE HCL 25 MG PO CAPS: 25.0000 mg | ORAL_CAPSULE | ORAL | Status: DC | PRN

## 2019-08-10 MED ORDER — DULOXETINE HCL 20 MG PO CPEP
20.0000 mg | ORAL_CAPSULE | Freq: Every day | ORAL | Status: DC
  Administered 2019-08-10: 20 mg via ORAL
  Filled 2019-08-10 (×6): qty 1

## 2019-08-10 MED ORDER — FOLIC ACID 1 MG PO TABS
1.0000 mg | ORAL_TABLET | Freq: Every day | ORAL | Status: DC
  Administered 2019-08-10 – 2019-08-14 (×5): 1 mg via ORAL
  Filled 2019-08-10 (×5): qty 1

## 2019-08-10 MED ORDER — SODIUM CHLORIDE 0.45 % IV SOLN: INTRAVENOUS | Status: DC

## 2019-08-10 MED ORDER — KETOROLAC TROMETHAMINE 15 MG/ML IJ SOLN
15.0000 mg | Freq: Four times a day (QID) | INTRAMUSCULAR | Status: DC
  Administered 2019-08-10 – 2019-08-14 (×15): 15 mg via INTRAVENOUS
  Filled 2019-08-10 (×15): qty 1

## 2019-08-10 MED ORDER — OXYCODONE HCL 5 MG PO TABS
20.0000 mg | ORAL_TABLET | Freq: Once | ORAL | Status: AC
  Administered 2019-08-10: 20 mg via ORAL
  Filled 2019-08-10: qty 4

## 2019-08-10 MED ORDER — HYDROMORPHONE 1 MG/ML IV SOLN
INTRAVENOUS | Status: DC
  Administered 2019-08-10: 9 mg via INTRAVENOUS
  Administered 2019-08-10: 2.5 mg via INTRAVENOUS
  Administered 2019-08-10: 7 mg via INTRAVENOUS
  Administered 2019-08-10: 30 mg via INTRAVENOUS
  Administered 2019-08-11: 8.5 mg via INTRAVENOUS
  Administered 2019-08-11: 4.5 mL via INTRAVENOUS
  Administered 2019-08-11 (×2): 30 mg via INTRAVENOUS
  Administered 2019-08-11: 2.7 mg via INTRAVENOUS
  Administered 2019-08-11: 5.5 mg via INTRAVENOUS
  Administered 2019-08-11: 12 mg via INTRAVENOUS
  Administered 2019-08-12: 7 mg via INTRAVENOUS
  Administered 2019-08-12: 30 mg via INTRAVENOUS
  Administered 2019-08-12: 1.5 mg via INTRAVENOUS
  Administered 2019-08-12: 2 mg via INTRAVENOUS
  Administered 2019-08-12: 7 mg via INTRAVENOUS
  Administered 2019-08-13: 6 mg via INTRAVENOUS
  Administered 2019-08-13 (×2): 7 mg via INTRAVENOUS
  Administered 2019-08-13: 12.5 mg via INTRAVENOUS
  Administered 2019-08-13: 30 mg via INTRAVENOUS
  Administered 2019-08-13: 3 mg via INTRAVENOUS
  Administered 2019-08-14: 5 mg via INTRAVENOUS
  Administered 2019-08-14: 2 mg via INTRAVENOUS
  Filled 2019-08-10 (×5): qty 30

## 2019-08-10 MED ORDER — HYDROXYUREA 500 MG PO CAPS
2000.0000 mg | ORAL_CAPSULE | Freq: Every day | ORAL | Status: DC
  Administered 2019-08-10 – 2019-08-14 (×5): 2000 mg via ORAL
  Filled 2019-08-10 (×5): qty 4

## 2019-08-10 MED ORDER — SENNOSIDES-DOCUSATE SODIUM 8.6-50 MG PO TABS
1.0000 | ORAL_TABLET | Freq: Two times a day (BID) | ORAL | Status: DC
  Administered 2019-08-10 – 2019-08-14 (×8): 1 via ORAL
  Filled 2019-08-10 (×8): qty 1

## 2019-08-10 MED ORDER — POLYETHYLENE GLYCOL 3350 17 G PO PACK: 17.0000 g | PACK | Freq: Every day | ORAL | Status: DC | PRN

## 2019-08-10 MED ORDER — NALOXONE HCL 0.4 MG/ML IJ SOLN: 0.4000 mg | INTRAMUSCULAR | Status: DC | PRN

## 2019-08-10 MED ORDER — KETOROLAC TROMETHAMINE 30 MG/ML IJ SOLN
15.0000 mg | Freq: Once | INTRAMUSCULAR | Status: AC
  Administered 2019-08-10: 15 mg via INTRAVENOUS
  Filled 2019-08-10: qty 1

## 2019-08-10 NOTE — Progress Notes (Signed)
   08/10/19 2121  Assess: MEWS Score  Temp 98.8 F (37.1 C)  BP 127/70  Pulse Rate (!) 111  Resp 13  SpO2 96 %  O2 Device Room Air  Assess: MEWS Score  MEWS Temp 0  MEWS Systolic 0  MEWS Pulse 2  MEWS RR 1  MEWS LOC 0  MEWS Score 3  MEWS Score Color Yellow  Assess: if the MEWS score is Yellow or Red  Were vital signs taken at a resting state? Yes  Focused Assessment Documented focused assessment  Early Detection of Sepsis Score *See Row Information* Low  MEWS guidelines implemented *See Row Information* Yes  Treat  MEWS Interventions Administered scheduled meds/treatments;Other (Comment) (encouraged PCA use)  Take Vital Signs  Increase Vital Sign Frequency  Yellow: Q 2hr X 2 then Q 4hr X 2, if remains yellow, continue Q 4hrs  Escalate  MEWS: Escalate Yellow: discuss with charge nurse/RN and consider discussing with provider and RRT  Notify: Charge Nurse/RN  Name of Charge Nurse/RN Notified Debbie RN  Date Charge Nurse/RN Notified 08/10/19  Time Charge Nurse/RN Notified 2146   Patient did not show any signs and symptoms of distress. Patient stated she was in pain 10/10. Encouraged PCA pump use and went over scheduled pain med administration. Will continue to monitor the patient.

## 2019-08-10 NOTE — Discharge Instructions (Signed)
Sickle Cell Anemia, Adult  Sickle cell anemia is a condition where your red blood cells are shaped like sickles. Red blood cells carry oxygen through the body. Sickle-shaped cells do not live as long as normal red blood cells. They also clump together and block blood from flowing through the blood vessels. This prevents the body from getting enough oxygen. Sickle cell anemia causes organ damage and pain. It also increases the risk of infection. Follow these instructions at home: Medicines  Take over-the-counter and prescription medicines only as told by your doctor.  If you were prescribed an antibiotic medicine, take it as told by your doctor. Do not stop taking the antibiotic even if you start to feel better.  If you develop a fever, do not take medicines to lower the fever right away. Tell your doctor about the fever. Managing pain, stiffness, and swelling  Try these methods to help with pain: ? Use a heating pad. ? Take a warm bath. ? Distract yourself, such as by watching TV. Eating and drinking  Drink enough fluid to keep your pee (urine) clear or pale yellow. Drink more in hot weather and during exercise.  Limit or avoid alcohol.  Eat a healthy diet. Eat plenty of fruits, vegetables, whole grains, and lean protein.  Take vitamins and supplements as told by your doctor. Traveling  When traveling, keep these with you: ? Your medical information. ? The names of your doctors. ? Your medicines.  If you need to take an airplane, talk to your doctor first. Activity  Rest often.  Avoid exercises that make your heart beat much faster, such as jogging. General instructions  Do not use products that have nicotine or tobacco, such as cigarettes and e-cigarettes. If you need help quitting, ask your doctor.  Consider wearing a medical alert bracelet.  Avoid being in high places (high altitudes), such as mountains.  Avoid very hot or cold temperatures.  Avoid places where the  temperature changes a lot.  Keep all follow-up visits as told by your doctor. This is important. Contact a doctor if:  A joint hurts.  Your feet or hands hurt or swell.  You feel tired (fatigued). Get help right away if:  You have symptoms of infection. These include: ? Fever. ? Chills. ? Being very tired. ? Irritability. ? Poor eating. ? Throwing up (vomiting).  You feel dizzy or faint.  You have new stomach pain, especially on the left side.  You have a an erection (priapism) that lasts more than 4 hours.  You have numbness in your arms or legs.  You have a hard time moving your arms or legs.  You have trouble talking.  You have pain that does not go away when you take medicine.  You are short of breath.  You are breathing fast.  You have a long-term cough.  You have pain in your chest.  You have a bad headache.  You have a stiff neck.  Your stomach looks bloated even though you did not eat much.  Your skin is pale.  You suddenly cannot see well. Summary  Sickle cell anemia is a condition where your red blood cells are shaped like sickles.  Follow your doctor's advice on ways to manage pain, food to eat, activities to do, and steps to take for safe travel.  Get medical help right away if you have any signs of infection, such as a fever. This information is not intended to replace advice given to you by   your health care provider. Make sure you discuss any questions you have with your health care provider. Document Revised: 05/14/2018 Document Reviewed: 02/26/2016 Elsevier Patient Education  2020 [employer].  

## 2019-08-10 NOTE — H&P (Signed)
Attending physician: Quentin Angst, MD  Chief Complaint: Sickle cell pain  History of Present Illness: Stephanie Burch is a 39 year old female with a medical history significant for sickle cell disease, chronic pain syndrome, opiate dependence and tolerance, history of anemia of chronic disease, and history of depression anxiety presents complaining of right lower extremity pain that is consistent with her previous sickle cell pain crisis.  Patient states that pain occurred suddenly on yesterday and has been constant and sharp since that time.  She has been taking prescribed pain medications around-the-clock without sustained relief.  She last had oxycodone this a.m. which was ineffective.  Pain intensity is 10/10.  Patient states that it has been difficult to bear weight.  She denies any falls.  No edema or erythema.  Patient denies fever, chills, chest pain, shortness of breath, persistent cough, urinary symptoms, nausea, vomiting, or diarrhea.  She also denies any sick contacts, recent travel, or exposure to COVID-19.  Meds: Medications Prior to Admission  Medication Sig Dispense Refill Last Dose  . folic acid (FOLVITE) 1 MG tablet Take 1 tablet (1 mg total) by mouth daily. 30 tablet 11 over 2 months  . ibuprofen (ADVIL) 800 MG tablet TAKE 1 TABLET (800 MG TOTAL) BY MOUTH EVERY 8 (EIGHT) HOURS AS NEEDED FOR HEADACHE, MILD PAIN, MODERATE PAIN OR CRAMPING. 30 tablet 3 08/10/2019 at Unknown time  . naloxone (NARCAN) 0.4 MG/ML injection As needed (Patient taking differently: Inject 0.4 mg into the skin as needed (overdose). As needed) 1 mL 0 unknown  . ondansetron (ZOFRAN) 4 MG tablet TAKE 1 TABLET BY MOUTH EVERY 8 HOURS AS NEEDED FOR NAUSEA AND VOMITING (Patient taking differently: Take 4 mg by mouth every 8 (eight) hours as needed for nausea or vomiting. ) 30 tablet 11 Past Week at Unknown time  . oxyCODONE ER (XTAMPZA ER) 18 MG C12A Take 18 mg by mouth every 12 (twelve) hours. 60 capsule 0  08/10/2019 at Unknown time  . Oxycodone HCl 20 MG TABS Take 1 tablet (20 mg total) by mouth every 4 (four) hours as needed (for pain). 90 tablet 0 08/10/2019 at Unknown time  . tiZANidine (ZANAFLEX) 4 MG capsule Take 1 capsule (4 mg total) by mouth 3 (three) times daily. (Patient taking differently: Take 4 mg by mouth 3 (three) times daily as needed for muscle spasms. ) 90 capsule 3 Past Week at Unknown time  . Vitamin D, Ergocalciferol, (DRISDOL) 1.25 MG (50000 UT) CAPS capsule Take 1 capsule (50,000 Units total) by mouth every 7 (seven) days. 5 capsule 6 several months  . voxelotor (OXBRYTA) 500 MG TABS tablet Take 1,500 mg by mouth daily. For Sickle Cell Anemia   Past Week at Unknown time    Allergies: Patient has no known allergies. Past Medical History:  Diagnosis Date  . Nausea   . Sickle cell anemia (HCC)   . Vitamin B12 deficiency 12/2018  . Vitamin D deficiency    History reviewed. No pertinent surgical history. Family History  Problem Relation Age of Onset  . Hypertension Mother   . Sickle cell trait Mother   . Diabetes Father   . Sickle cell trait Father    Social History   Socioeconomic History  . Marital status: Single    Spouse name: Not on file  . Number of children: Not on file  . Years of education: Not on file  . Highest education level: Not on file  Occupational History  . Not on file  Tobacco  Use  . Smoking status: Never Smoker  . Smokeless tobacco: Never Used  Vaping Use  . Vaping Use: Never used  Substance and Sexual Activity  . Alcohol use: Not Currently  . Drug use: Never  . Sexual activity: Yes    Birth control/protection: None  Other Topics Concern  . Not on file  Social History Narrative  . Not on file   Social Determinants of Health   Financial Resource Strain:   . Difficulty of Paying Living Expenses:   Food Insecurity:   . Worried About Programme researcher, broadcasting/film/video in the Last Year:   . Barista in the Last Year:   Transportation Needs:    . Freight forwarder (Medical):   Marland Kitchen Lack of Transportation (Non-Medical):   Physical Activity:   . Days of Exercise per Week:   . Minutes of Exercise per Session:   Stress:   . Feeling of Stress :   Social Connections:   . Frequency of Communication with Friends and Family:   . Frequency of Social Gatherings with Friends and Family:   . Attends Religious Services:   . Active Member of Clubs or Organizations:   . Attends Banker Meetings:   Marland Kitchen Marital Status:   Intimate Partner Violence:   . Fear of Current or Ex-Partner:   . Emotionally Abused:   Marland Kitchen Physically Abused:   . Sexually Abused:     Lab results: Results for orders placed or performed during the hospital encounter of 08/10/19 (from the past 24 hour(s))  CBC with Differential/Platelet     Status: Abnormal   Collection Time: 08/10/19 11:15 AM  Result Value Ref Range   WBC 13.0 (H) 4.0 - 10.5 K/uL   RBC 4.56 3.87 - 5.11 MIL/uL   Hemoglobin 11.1 (L) 12.0 - 15.0 g/dL   HCT 27.5 (L) 36 - 46 %   MCV 71.3 (L) 80.0 - 100.0 fL   MCH 24.3 (L) 26.0 - 34.0 pg   MCHC 34.2 30.0 - 36.0 g/dL   RDW 17.0 01.7 - 49.4 %   Platelets 185 150 - 400 K/uL   nRBC 3.2 (H) 0.0 - 0.2 %   Neutrophils Relative % 63 %   Neutro Abs 8.4 (H) 1.7 - 7.7 K/uL   Lymphocytes Relative 21 %   Lymphs Abs 2.7 0.7 - 4.0 K/uL   Monocytes Relative 10 %   Monocytes Absolute 1.3 (H) 0 - 1 K/uL   Eosinophils Relative 4 %   Eosinophils Absolute 0.5 0 - 0 K/uL   Basophils Relative 1 %   Basophils Absolute 0.1 0 - 0 K/uL   Immature Granulocytes 1 %   Abs Immature Granulocytes 0.09 (H) 0.00 - 0.07 K/uL   Polychromasia PRESENT    Sickle Cells PRESENT    Target Cells PRESENT   Comprehensive metabolic panel     Status: Abnormal   Collection Time: 08/10/19 11:15 AM  Result Value Ref Range   Sodium 138 135 - 145 mmol/L   Potassium 4.6 3.5 - 5.1 mmol/L   Chloride 101 98 - 111 mmol/L   CO2 25 22 - 32 mmol/L   Glucose, Bld 103 (H) 70 - 99 mg/dL    BUN 11 6 - 20 mg/dL   Creatinine, Ser 4.96 0.44 - 1.00 mg/dL   Calcium 9.1 8.9 - 75.9 mg/dL   Total Protein 7.3 6.5 - 8.1 g/dL   Albumin 3.8 3.5 - 5.0 g/dL   AST 23 15 -  41 U/L   ALT 36 0 - 44 U/L   Alkaline Phosphatase 76 38 - 126 U/L   Total Bilirubin 1.2 0.3 - 1.2 mg/dL   GFR calc non Af Amer >60 >60 mL/min   GFR calc Af Amer >60 >60 mL/min   Anion gap 12 5 - 15  Reticulocytes     Status: Abnormal   Collection Time: 08/10/19 11:15 AM  Result Value Ref Range   Retic Ct Pct 5.1 (H) 0.4 - 3.1 %   RBC. 4.54 3.87 - 5.11 MIL/uL   Retic Count, Absolute 229.3 (H) 19.0 - 186.0 K/uL   Immature Retic Fract 38.3 (H) 2.3 - 15.9 %  Pregnancy, urine     Status: None   Collection Time: 08/10/19  1:30 PM  Result Value Ref Range   Preg Test, Ur NEGATIVE NEGATIVE  Rapid urine drug screen (hospital performed)     Status: Abnormal   Collection Time: 08/10/19  1:30 PM  Result Value Ref Range   Opiates POSITIVE (A) NONE DETECTED   Cocaine NONE DETECTED NONE DETECTED   Benzodiazepines NONE DETECTED NONE DETECTED   Amphetamines NONE DETECTED NONE DETECTED   Tetrahydrocannabinol NONE DETECTED NONE DETECTED   Barbiturates NONE DETECTED NONE DETECTED    Allergies: Patient has no known allergies. Past Medical History:  Diagnosis Date  . Nausea   . Sickle cell anemia (HCC)   . Vitamin B12 deficiency 12/2018  . Vitamin D deficiency    History reviewed. No pertinent surgical history. Family History  Problem Relation Age of Onset  . Hypertension Mother   . Sickle cell trait Mother   . Diabetes Father   . Sickle cell trait Father    Social History   Socioeconomic History  . Marital status: Single    Spouse name: Not on file  . Number of children: Not on file  . Years of education: Not on file  . Highest education level: Not on file  Occupational History  . Not on file  Tobacco Use  . Smoking status: Never Smoker  . Smokeless tobacco: Never Used  Vaping Use  . Vaping Use: Never  used  Substance and Sexual Activity  . Alcohol use: Not Currently  . Drug use: Never  . Sexual activity: Yes    Birth control/protection: None  Other Topics Concern  . Not on file  Social History Narrative  . Not on file   Social Determinants of Health   Financial Resource Strain:   . Difficulty of Paying Living Expenses:   Food Insecurity:   . Worried About Programme researcher, broadcasting/film/videounning Out of Food in the Last Year:   . Baristaan Out of Food in the Last Year:   Transportation Needs:   . Freight forwarderLack of Transportation (Medical):   Marland Kitchen. Lack of Transportation (Non-Medical):   Physical Activity:   . Days of Exercise per Week:   . Minutes of Exercise per Session:   Stress:   . Feeling of Stress :   Social Connections:   . Frequency of Communication with Friends and Family:   . Frequency of Social Gatherings with Friends and Family:   . Attends Religious Services:   . Active Member of Clubs or Organizations:   . Attends BankerClub or Organization Meetings:   Marland Kitchen. Marital Status:   Intimate Partner Violence:   . Fear of Current or Ex-Partner:   . Emotionally Abused:   Marland Kitchen. Physically Abused:   . Sexually Abused:    Review of Systems  Constitutional: Negative.  HENT: Negative.   Eyes: Negative.   Respiratory: Negative.   Cardiovascular: Negative.   Gastrointestinal: Negative.   Genitourinary: Negative.   Musculoskeletal: Positive for joint pain.  Skin: Negative.   Neurological: Negative.   Psychiatric/Behavioral: Negative.     Physical Exam: Blood pressure 127/70, pulse (!) 111, temperature 98.8 F (37.1 C), temperature source Oral, resp. rate 13, height 5\' 5"  (1.651 m), weight 132.9 kg, last menstrual period 07/07/2019, SpO2 96 %. Physical Exam Constitutional:      Appearance: She is obese.  Eyes:     General: No scleral icterus.    Pupils: Pupils are equal, round, and reactive to light.  Cardiovascular:     Rate and Rhythm: Normal rate and regular rhythm.     Pulses: Normal pulses.  Pulmonary:      Effort: Pulmonary effort is normal.  Abdominal:     General: Bowel sounds are normal.  Neurological:     General: No focal deficit present.     Mental Status: She is alert. Mental status is at baseline.  Psychiatric:        Mood and Affect: Mood normal.        Behavior: Behavior normal.        Thought Content: Thought content normal.        Judgment: Judgment normal.      Lab results: Results for orders placed or performed during the hospital encounter of 08/10/19 (from the past 24 hour(s))  CBC with Differential/Platelet     Status: Abnormal   Collection Time: 08/10/19 11:15 AM  Result Value Ref Range   WBC 13.0 (H) 4.0 - 10.5 K/uL   RBC 4.56 3.87 - 5.11 MIL/uL   Hemoglobin 11.1 (L) 12.0 - 15.0 g/dL   HCT 10/11/19 (L) 36 - 46 %   MCV 71.3 (L) 80.0 - 100.0 fL   MCH 24.3 (L) 26.0 - 34.0 pg   MCHC 34.2 30.0 - 36.0 g/dL   RDW 02.4 09.7 - 35.3 %   Platelets 185 150 - 400 K/uL   nRBC 3.2 (H) 0.0 - 0.2 %   Neutrophils Relative % 63 %   Neutro Abs 8.4 (H) 1.7 - 7.7 K/uL   Lymphocytes Relative 21 %   Lymphs Abs 2.7 0.7 - 4.0 K/uL   Monocytes Relative 10 %   Monocytes Absolute 1.3 (H) 0 - 1 K/uL   Eosinophils Relative 4 %   Eosinophils Absolute 0.5 0 - 0 K/uL   Basophils Relative 1 %   Basophils Absolute 0.1 0 - 0 K/uL   Immature Granulocytes 1 %   Abs Immature Granulocytes 0.09 (H) 0.00 - 0.07 K/uL   Polychromasia PRESENT    Sickle Cells PRESENT    Target Cells PRESENT   Comprehensive metabolic panel     Status: Abnormal   Collection Time: 08/10/19 11:15 AM  Result Value Ref Range   Sodium 138 135 - 145 mmol/L   Potassium 4.6 3.5 - 5.1 mmol/L   Chloride 101 98 - 111 mmol/L   CO2 25 22 - 32 mmol/L   Glucose, Bld 103 (H) 70 - 99 mg/dL   BUN 11 6 - 20 mg/dL   Creatinine, Ser 10/11/19 0.44 - 1.00 mg/dL   Calcium 9.1 8.9 - 2.42 mg/dL   Total Protein 7.3 6.5 - 8.1 g/dL   Albumin 3.8 3.5 - 5.0 g/dL   AST 23 15 - 41 U/L   ALT 36 0 - 44 U/L   Alkaline Phosphatase 76 38 -  126 U/L    Total Bilirubin 1.2 0.3 - 1.2 mg/dL   GFR calc non Af Amer >60 >60 mL/min   GFR calc Af Amer >60 >60 mL/min   Anion gap 12 5 - 15  Reticulocytes     Status: Abnormal   Collection Time: 08/10/19 11:15 AM  Result Value Ref Range   Retic Ct Pct 5.1 (H) 0.4 - 3.1 %   RBC. 4.54 3.87 - 5.11 MIL/uL   Retic Count, Absolute 229.3 (H) 19.0 - 186.0 K/uL   Immature Retic Fract 38.3 (H) 2.3 - 15.9 %  Pregnancy, urine     Status: None   Collection Time: 08/10/19  1:30 PM  Result Value Ref Range   Preg Test, Ur NEGATIVE NEGATIVE  Rapid urine drug screen (hospital performed)     Status: Abnormal   Collection Time: 08/10/19  1:30 PM  Result Value Ref Range   Opiates POSITIVE (A) NONE DETECTED   Cocaine NONE DETECTED NONE DETECTED   Benzodiazepines NONE DETECTED NONE DETECTED   Amphetamines NONE DETECTED NONE DETECTED   Tetrahydrocannabinol NONE DETECTED NONE DETECTED   Barbiturates NONE DETECTED NONE DETECTED    Imaging results:  No results found.   Assessment & Plan: Patient admitted to sickle cell day infusion center for management of pain crisis.  Patient is opiate naive Initiate IV dilaudid PCA. Settings of 0.6 mg, 10-minute lockout, and 4 mg/h. IV fluids, 0.45% saline at 100 mL/h Toradol 15 mg IV times one dose Tylenol 1000 mg by mouth times one dose Review CBC with differential, complete metabolic panel, and reticulocytes as results become available. Pain intensity will be reevaluated in context of functioning and relationship to baseline as care progress If pain intensity remains elevated and/or sudden change in hemodynamic stability transition to inpatient services for higher level of care.   Nolon Nations  APRN, MSN, FNP-C Patient Care [facility] [address] [place], [place] [address] [phone]   08/10/2019, 9:44 PM

## 2019-08-10 NOTE — Progress Notes (Signed)
Spoke with on-call provider, no new orders and to encourage pt to use the PCA as scheduled because she is not using the maximum amount to be given a prn. Will continue to monitor the patient.

## 2019-08-10 NOTE — Telephone Encounter (Signed)
Patient called requesting to come to the day infusion hospital for sickle cell pain. Patient reports right leg pain rated 10/10. Reports taking Oxycontin at 3:00 am and Oxycodone at 2:00 am. COVID-19 screening done and patient denies all symptoms and exposures. Denies fever, chest pain, nausea, vomiting, diarrhea and abdominal pain. Patient reports that she will drive herself to the clinic but she has someone else to drive her at discharge.  [place], FNP notified. Patient can come to the day hospital for pain management. Patient advised and expresses an understanding.

## 2019-08-10 NOTE — Progress Notes (Signed)
Patient admitted to the day hospital for treatment of sickle cell pain crisis. Patient reported pain rated 10/10 in the right leg. Patient placed on Dilaudid PCA, given IV Toradol, PO Tylenol, PO oxycodone and hydrated with IV fluids. Patient transferred to [facility] 5 west room 38. Report given to [facility]. Reported  pain on transfer was 10/10. Patient alert, oriented and transported in a wheelchair.

## 2019-08-10 NOTE — H&P (Addendum)
H&P  Patient Demographics:  Stephanie Burch, is a 39 y.o. female  MRN: [id number]   DOB - August 07, 1980  Admit Date - 08/10/2019  Outpatient Primary MD for the patient is Kallie Locks, FNP      HPI:   Stephanie Burch  is a 39 y.o. female Stephanie Burch is a 39 year old female with a medical history significant for sickle cell disease, chronic pain syndrome, opiate dependence and tolerance, history of anemia of chronic disease, and history of depression anxiety presents complaining of right lower extremity pain that is consistent with her previous sickle cell pain crisis.  Patient states that pain occurred suddenly on yesterday and has been constant and sharp since that time.  She has been taking prescribed pain medications around-the-clock without sustained relief.  She last had oxycodone this a.m. which was ineffective.  Pain intensity is 10/10.  Patient states that it has been difficult to bear weight.  She denies any falls.  No edema or erythema.  Patient denies fever, chills, chest pain, shortness of breath, persistent cough, urinary symptoms, nausea, vomiting, or diarrhea.  She also denies any sick contacts, recent travel, or exposure to COVID-19.  Sickle cell day infusion center:  Patient found to have vitals of BP 127/70 (BP Location: Left Arm)   Pulse (!) 111   Temp 98.8 F (37.1 C) (Oral)   Resp 13   Ht 5\' 5"  (1.651 m)   Wt 132.9 kg   LMP 07/07/2019   SpO2 96%   BMI 48.76 kg/m Patient's vital signs are consistent with her baseline. COVID 19 test pending. Pain persists despite IV dilaudid PCA, Toradol, Tylenol, and IV fluids. Patient admitted to med surg for further management of sickle cell pain crisis.    Review of systems:  In addition to the HPI above, patient reports Review of Systems  HENT: Negative.   Eyes: Negative.   Respiratory: Negative.   Cardiovascular: Negative.  Negative for chest pain and palpitations.  Gastrointestinal: Negative.   Genitourinary: Negative.    Musculoskeletal: Positive for joint pain.  Skin: Negative.   Neurological: Negative.   Psychiatric/Behavioral: Negative.     A full 10 point Review of Systems was done, except as stated above, all other Review of Systems were negative.  With Past History of the following :   Past Medical History:  Diagnosis Date  . Nausea   . Sickle cell anemia (HCC)   . Vitamin B12 deficiency 12/2018  . Vitamin D deficiency       History reviewed. No pertinent surgical history.   Social History:   Social History   Tobacco Use  . Smoking status: Never Smoker  . Smokeless tobacco: Never Used  Substance Use Topics  . Alcohol use: Not Currently     Lives - At home   Family History :   Family History  Problem Relation Age of Onset  . Hypertension Mother   . Sickle cell trait Mother   . Diabetes Father   . Sickle cell trait Father      Home Medications:   Prior to Admission medications   Medication Sig Start Date End Date Taking? Authorizing Provider  folic acid (FOLVITE) 1 MG tablet Take 1 tablet (1 mg total) by mouth daily. 11/15/18  Yes 01/15/19, FNP  ibuprofen (ADVIL) 800 MG tablet TAKE 1 TABLET (800 MG TOTAL) BY MOUTH EVERY 8 (EIGHT) HOURS AS NEEDED FOR HEADACHE, MILD PAIN, MODERATE PAIN OR CRAMPING. 06/09/19  Yes 08/09/19, FNP  naloxone (NARCAN) 0.4 MG/ML injection As needed Patient taking differently: Inject 0.4 mg into the skin as needed (overdose). As needed 09/14/18  Yes Kallie Locks, FNP  ondansetron (ZOFRAN) 4 MG tablet TAKE 1 TABLET BY MOUTH EVERY 8 HOURS AS NEEDED FOR NAUSEA AND VOMITING Patient taking differently: Take 4 mg by mouth every 8 (eight) hours as needed for nausea or vomiting.  07/14/19  Yes Kallie Locks, FNP  oxyCODONE ER [facility]) 18 MG C12A Take 18 mg by mouth every 12 (twelve) hours. 08/01/19  Yes Kallie Locks, FNP  Oxycodone HCl 20 MG TABS Take 1 tablet (20 mg total) by mouth every 4 (four) hours as needed (for pain).  07/29/19  Yes Kallie Locks, FNP  tiZANidine (ZANAFLEX) 4 MG capsule Take 1 capsule (4 mg total) by mouth 3 (three) times daily. Patient taking differently: Take 4 mg by mouth 3 (three) times daily as needed for muscle spasms.  06/26/19 10/24/19 Yes Kallie Locks, FNP  Vitamin D, Ergocalciferol, (DRISDOL) 1.25 MG (50000 UT) CAPS capsule Take 1 capsule (50,000 Units total) by mouth every 7 (seven) days. 12/21/18  Yes Kallie Locks, FNP  voxelotor (OXBRYTA) 500 MG TABS tablet Take 1,500 mg by mouth daily. For Sickle Cell Anemia   Yes [provider]     Allergies:   No Known Allergies   Physical Exam:   Vitals:   Vitals:   08/10/19 2108 08/10/19 2121  BP:  127/70  Pulse:  (!) 111  Resp: 16 13  Temp:  98.8 F (37.1 C)  SpO2: 97% 96%    Physical Exam: Constitutional: Patient appears well-developed and well-nourished. Not in obvious distress.Morbid obesity HENT: Normocephalic, atraumatic, External right and left ear normal. Oropharynx is clear and moist.  Eyes: Conjunctivae and EOM are normal. PERRLA, no scleral icterus. Neck: Normal ROM. Neck supple. No JVD. No tracheal deviation. No thyromegaly. CVS: RRR, S1/S2 +, no murmurs, no gallops, no carotid bruit.  Pulmonary: Effort and breath sounds normal, no stridor, rhonchi, wheezes, rales.  Abdominal: Soft. BS +, no distension, tenderness, rebound or guarding.  Musculoskeletal: Normal range of motion. No edema and no tenderness.  Lymphadenopathy: No lymphadenopathy noted, cervical, inguinal or axillary Neuro: Alert. Normal reflexes, muscle tone coordination. No cranial nerve deficit. Skin: Skin is warm and dry. No rash noted. Not diaphoretic. No erythema. No pallor. Psychiatric: Normal mood and affect. Behavior, judgment, thought content normal.   Data Review:   CBC Recent Labs  Lab 08/10/19 1115  WBC 13.0*  HGB 11.1*  HCT 32.5*  PLT 185  MCV 71.3*  MCH 24.3*  MCHC 34.2  RDW 15.2  LYMPHSABS 2.7   MONOABS 1.3*  EOSABS 0.5  BASOSABS 0.1   ------------------------------------------------------------------------------------------------------------------  Chemistries  Recent Labs  Lab 08/10/19 1115  NA 138  K 4.6  CL 101  CO2 25  GLUCOSE 103*  BUN 11  CREATININE 0.58  CALCIUM 9.1  AST 23  ALT 36  ALKPHOS 76  BILITOT 1.2   ------------------------------------------------------------------------------------------------------------------ estimated creatinine clearance is 134.1 mL/min (by C-G formula based on SCr of 0.58 mg/dL). ------------------------------------------------------------------------------------------------------------------ No results for input(s): TSH, T4TOTAL, T3FREE, THYROIDAB in the last 72 hours.  Invalid input(s): FREET3  Coagulation profile No results for input(s): INR, PROTIME in the last 168 hours. ------------------------------------------------------------------------------------------------------------------- No results for input(s): DDIMER in the last 72 hours. -------------------------------------------------------------------------------------------------------------------  Cardiac Enzymes No results for input(s): CKMB, TROPONINI, MYOGLOBIN in the last 168 hours.  Invalid input(s): CK ------------------------------------------------------------------------------------------------------------------ No results found  for: BNP  ---------------------------------------------------------------------------------------------------------------  Urinalysis    Component Value Date/Time   COLORURINE YELLOW 06/26/2019 1229   APPEARANCEUR HAZY (A) 06/26/2019 1229   LABSPEC 1.013 06/26/2019 1229   PHURINE 6.0 06/26/2019 1229   GLUCOSEU NEGATIVE 06/26/2019 1229   HGBUR NEGATIVE 06/26/2019 1229   BILIRUBINUR NEGATIVE 06/26/2019 1229   BILIRUBINUR Negative 04/27/2019 0935   KETONESUR NEGATIVE 06/26/2019 1229   PROTEINUR NEGATIVE 06/26/2019 1229    UROBILINOGEN 1.0 04/27/2019 0935   NITRITE NEGATIVE 06/26/2019 1229   LEUKOCYTESUR NEGATIVE 06/26/2019 1229    ----------------------------------------------------------------------------------------------------------------   Imaging Results:    No results found.    Assessment & Plan:  Principal Problem:   Sickle cell pain crisis (HCC) Active Problems:   Leukocytosis   Morbidly obese (HCC)   Chronic pain syndrome   Anxiety  Sickle cell disease with pain crisis:  Patient admitted to MedSurg for management of sickle cell pain crisis.  Pain persisted despite IV Dilaudid PCA.  Continue IV fluids 0.45% saline at 100 mL/h. IV Dilaudid PCA with settings of 0.6 mg, 10-minute lockout, and 4 mg/h. Toradol 15 mg IV every 6 hours for total of 5 days Monitor vital signs closely, reevaluate pain scale regularly, and supplemental oxygen as needed. Pain will be reevaluated in context of function and relationship to baseline as care progresses  Chronic pain syndrome:  Continue home medications  Leukocytosis:  WBCs 13.0.  Considered to be reactive.  Patient is afebrile without any signs of infection or inflammation.  Continue to monitor closely without antibiotics.  Repeat CBC in a.m.  Sickle cell anemia: Hemoglobin is 11.1, consistent with patient's baseline.  There is no clinical indication for blood transfusion at this time.  Continue to follow CBC.  Morbid obesity Patient counseled at length on the importance of following a low-fat, low carbohydrate diet.  Low impact exercise when pain is controlled.  Depression and anxiety: Stable.  Continue home medications.  No suicidal or homicidal intent.  Continue to follow closely.  DVT Prophylaxis: Subcut Lovenox   AM Labs Ordered, also please review Full Orders  Family Communication: Admission, patient's condition and plan of care including tests being ordered have been discussed with the patient who indicate understanding and agree with  the plan and Code Status.  Code Status: Full Code  Consults called: None    Admission status: Inpatient    Time spent in minutes : 40 minutes  Nolon Nations  APRN, MSN, FNP-C Patient [facility] [address] [place], [place] [address] [phone]  08/10/2019 at 9:52 PM

## 2019-08-10 NOTE — Progress Notes (Signed)
   08/10/19 2316  Assess: MEWS Score  Temp 99.2 F (37.3 C)  BP (!) 127/59  Pulse Rate (!) 115  Resp 15  SpO2 93 %  O2 Device Room Air  Assess: MEWS Score  MEWS Temp 0  MEWS Systolic 0  MEWS Pulse 2  MEWS RR 0  MEWS LOC 0  MEWS Score 2  MEWS Score Color Yellow   Patients HR increased, pt is in pain 10/10. On-call notified, awaiting response. Will continue to monitor the patient.

## 2019-08-11 ENCOUNTER — Other Ambulatory Visit: Payer: Self-pay | Admitting: Family Medicine

## 2019-08-11 DIAGNOSIS — F1190 Opioid use, unspecified, uncomplicated: Secondary | ICD-10-CM

## 2019-08-11 DIAGNOSIS — D571 Sickle-cell disease without crisis: Secondary | ICD-10-CM

## 2019-08-11 DIAGNOSIS — G894 Chronic pain syndrome: Secondary | ICD-10-CM

## 2019-08-11 LAB — CBC
HCT: 30.5 % — ABNORMAL LOW (ref 36.0–46.0)
Hemoglobin: 10.6 g/dL — ABNORMAL LOW (ref 12.0–15.0)
MCH: 24.1 pg — ABNORMAL LOW (ref 26.0–34.0)
MCHC: 34.8 g/dL (ref 30.0–36.0)
MCV: 69.5 fL — ABNORMAL LOW (ref 80.0–100.0)
Platelets: 186 10*3/uL (ref 150–400)
RBC: 4.39 MIL/uL (ref 3.87–5.11)
RDW: 15.5 % (ref 11.5–15.5)
WBC: 13.1 10*3/uL — ABNORMAL HIGH (ref 4.0–10.5)
nRBC: 3 % — ABNORMAL HIGH (ref 0.0–0.2)

## 2019-08-11 LAB — BASIC METABOLIC PANEL
Anion gap: 9 (ref 5–15)
BUN: 12 mg/dL (ref 6–20)
CO2: 26 mmol/L (ref 22–32)
Calcium: 8.8 mg/dL — ABNORMAL LOW (ref 8.9–10.3)
Chloride: 103 mmol/L (ref 98–111)
Creatinine, Ser: 0.6 mg/dL (ref 0.44–1.00)
GFR calc Af Amer: >60 mL/min (ref >60)
GFR calc non Af Amer: >60 mL/min (ref >60)
Glucose, Bld: 108 mg/dL — ABNORMAL HIGH (ref 70–99)
Potassium: 4.5 mmol/L (ref 3.5–5.1)
Sodium: 138 mmol/L (ref 135–145)

## 2019-08-11 LAB — SARS CORONAVIRUS 2 (TAT 6-24 HRS): SARS Coronavirus 2: NEGATIVE

## 2019-08-11 MED ORDER — OXYCODONE HCL 5 MG PO TABS
15.0000 mg | ORAL_TABLET | Freq: Four times a day (QID) | ORAL | Status: DC | PRN
  Administered 2019-08-11 (×2): 15 mg via ORAL
  Filled 2019-08-11 (×2): qty 3

## 2019-08-11 MED ORDER — SODIUM CHLORIDE 0.9% FLUSH
10.0000 mL | Freq: Two times a day (BID) | INTRAVENOUS | Status: DC
  Administered 2019-08-13: 10 mL

## 2019-08-11 MED ORDER — SODIUM CHLORIDE 0.9% FLUSH: 10.0000 mL | INTRAVENOUS | Status: DC | PRN

## 2019-08-11 MED ORDER — OXYCODONE HCL 20 MG PO TABS: 20.0000 mg | ORAL_TABLET | ORAL | 0 refills | Status: DC | PRN

## 2019-08-11 MED ORDER — OXYCODONE HCL ER 15 MG PO T12A
15.0000 mg | EXTENDED_RELEASE_TABLET | Freq: Two times a day (BID) | ORAL | Status: DC
  Administered 2019-08-11 – 2019-08-14 (×6): 15 mg via ORAL
  Filled 2019-08-11 (×6): qty 1

## 2019-08-11 NOTE — Progress Notes (Signed)
Subjective: Stephanie Burch is a 39 year old female with a medical history significant for sickle cell disease, chronic pain syndrome, opiate dependence and tolerance, depression anxiety, anemia of chronic disease, and morbid obesity was admitted for sickle cell pain crisis.  Patient states that pain intensity has not improved much overnight.  She rates pain as 8/10 primarily to right lower extremity characterized as constant and throbbing.  Patient states that it has been difficult to apply full weight to right leg.  She denies headache, shortness of breath, chest pain, urinary symptoms, nausea, vomiting, or diarrhea.  Objective:  Vital signs in last 24 hours:  Vitals:   08/11/19 0854 08/11/19 0921 08/11/19 1200 08/11/19 1319  BP:  122/74  120/62  Pulse:  (!) 112  (!) 106  Resp: 19 20 15 18   Temp:  98.1 F (36.7 C)  98.2 F (36.8 C)  TempSrc:      SpO2: 97% 97% 96% 96%  Weight:      Height:        Intake/Output from previous day:   Intake/Output Summary (Last 24 hours) at 08/11/2019 1617 Last data filed at 08/11/2019 1122 Gross per 24 hour  Intake 2012 ml  Output --  Net 2012 ml    Physical Exam: General: Alert, awake, oriented x3, in no acute distress.  HEENT: [place]/AT PEERL, EOMI Neck: Trachea midline,  no masses, no thyromegal,y no JVD, no carotid bruit OROPHARYNX:  Moist, No exudate/ erythema/lesions.  Heart: Regular rate and rhythm, without murmurs, rubs, gallops, PMI non-displaced, no heaves or thrills on palpation.  Lungs: Clear to auscultation, no wheezing or rhonchi noted. No increased vocal fremitus resonant to percussion  Abdomen: Soft, nontender, nondistended, positive bowel sounds, no masses no hepatosplenomegaly noted..  Neuro: No focal neurological deficits noted cranial nerves II through XII grossly intact. DTRs 2+ bilaterally upper and lower extremities. Strength 5 out of 5 in bilateral upper and lower extremities. Musculoskeletal: No warm swelling or erythema  around joints, no spinal tenderness noted. Psychiatric: Patient alert and oriented x3, good insight and cognition, good recent to remote recall. Lymph node survey: No cervical axillary or inguinal lymphadenopathy noted.  Lab Results:  Basic Metabolic Panel:    Component Value Date/Time   NA 138 08/11/2019 0850   NA 142 09/11/2017 1431   K 4.5 08/11/2019 0850   CL 103 08/11/2019 0850   CO2 26 08/11/2019 0850   BUN 12 08/11/2019 0850   BUN 8 09/11/2017 1431   CREATININE 0.60 08/11/2019 0850   GLUCOSE 108 (H) 08/11/2019 0850   CALCIUM 8.8 (L) 08/11/2019 0850   CBC:    Component Value Date/Time   WBC 13.1 (H) 08/11/2019 0850   HGB 10.6 (L) 08/11/2019 0850   HGB 13.0 09/11/2017 1431   HCT 30.5 (L) 08/11/2019 0850   HCT 38.4 09/11/2017 1431   PLT 186 08/11/2019 0850   PLT 258 09/11/2017 1431   MCV 69.5 (L) 08/11/2019 0850   MCV 75 (L) 09/11/2017 1431   NEUTROABS 8.4 (H) 08/10/2019 1115   NEUTROABS 3.9 09/11/2017 1431   LYMPHSABS 2.7 08/10/2019 1115   LYMPHSABS 2.2 09/11/2017 1431   MONOABS 1.3 (H) 08/10/2019 1115   EOSABS 0.5 08/10/2019 1115   EOSABS 0.1 09/11/2017 1431   BASOSABS 0.1 08/10/2019 1115   BASOSABS 0.0 09/11/2017 1431    Recent Results (from the past 240 hour(s))  SARS CORONAVIRUS 2 (TAT 6-24 HRS) Nasopharyngeal Nasopharyngeal Swab     Status: None   Collection Time: 08/10/19  6:26 PM  Specimen: Nasopharyngeal Swab  Result Value Ref Range Status   SARS Coronavirus 2 NEGATIVE NEGATIVE Final    Comment: (NOTE) SARS-CoV-2 target nucleic acids are NOT DETECTED.  The SARS-CoV-2 RNA is generally detectable in upper and lower respiratory specimens during the acute phase of infection. Negative results do not preclude SARS-CoV-2 infection, do not rule out co-infections with other pathogens, and should not be used as the sole basis for treatment or other patient management decisions. Negative results must be combined with clinical observations, patient history,  and epidemiological information. The expected result is Negative.  Fact Sheet for Patients: HairSlick.no  Fact Sheet for Healthcare Providers: quierodirigir.com  This test is not yet approved or cleared by the [place] FDA and  has been authorized for detection and/or diagnosis of SARS-CoV-2 by FDA under an Emergency Use Authorization (EUA). This EUA will remain  in effect (meaning this test can be used) for the duration of the COVID-19 declaration under Se ction 564(b)(1) of the Act, 21 U.S.C. section 360bbb-3(b)(1), unless the authorization is terminated or revoked sooner.  Performed at [facility], [address] [place], [place] [address]     Studies/Results: No results found.  Medications: Scheduled Meds: . DULoxetine  20 mg Oral Daily  . enoxaparin (LOVENOX) injection  40 mg Subcutaneous Q24H  . folic acid  1 mg Oral Daily  . HYDROmorphone   Intravenous Q4H  . hydroxyurea  2,000 mg Oral Daily  . ketorolac  15 mg Intravenous Q6H  . oxyCODONE  15 mg Oral Q12H  . senna-docusate  1 tablet Oral BID  . sodium chloride flush  10-40 mL Intracatheter Q12H  . voxelotor  1,500 mg Oral Daily   Continuous Infusions: . sodium chloride 100 mL/hr at 08/11/19 1214   PRN Meds:.diphenhydrAMINE, naloxone **AND** sodium chloride flush, ondansetron (ZOFRAN) IV, polyethylene glycol, sodium chloride flush  Consultants:  None  Procedures:  None  Antibiotics:  None  Assessment/Plan: Principal Problem:   Sickle cell pain crisis (HCC) Active Problems:   Leukocytosis   Morbidly obese (HCC)   Chronic pain syndrome   Anxiety  Sickle cell disease with pain crisis: Continue IV Dilaudid PCA, no changes in settings on today. Continue IV fluids at [facility] Oxycodone 15 mg every 6 hours as needed for severe breakthrough pain Toradol 15 mg IV every 6 hours for total of 5 days Monitor vital signs closely, reevaluate pain  scale regularly, and supplemental oxygen as needed  Chronic pain syndrome: Continue home medications  Leukocytosis: WBCs mildly elevated, considered to be reactive.  Patient is afebrile without any signs of infection or inflammation.  Continue to follow CBC.  Sickle cell anemia: Patient's hemoglobin is consistent with baseline.  There is no clinical indication for blood transfusion at this time.  Continue to monitor closely.  History of depression and anxiety: Stable.  Continue home medications.  Patient denies suicidal or homicidal intent  Morbid obesity: Patient previously counseled at length.  Code Status: Full Code Family Communication: N/A Disposition Plan: Not yet ready for discharge  Yobani Schertzer Rennis Petty  APRN, MSN, FNP-C [facility] [address] [place], [place] [address] [phone]  If 5PM-7AM, please contact night-coverage.  08/11/2019, 4:17 PM  LOS: 1 day

## 2019-08-11 NOTE — Progress Notes (Signed)
   08/11/19 0520  Assess: MEWS Score  Temp 98.1 F (36.7 C)  BP (!) 149/90  Pulse Rate (!) 115  Resp 17  SpO2 96 %  O2 Device Room Air  Assess: MEWS Score  MEWS Temp 0  MEWS Systolic 0  MEWS Pulse 2  MEWS RR 0  MEWS LOC 0  MEWS Score 2  MEWS Score Color Yellow  Assess: if the MEWS score is Yellow or Red  Were vital signs taken at a resting state? Yes  Focused Assessment Documented focused assessment  Early Detection of Sepsis Score *See Row Information* Low  MEWS guidelines implemented *See Row Information* Yes  Treat  MEWS Interventions Administered scheduled meds/treatments;Other (Comment) (encouraged PCA use to help manage pain)  Take Vital Signs  Increase Vital Sign Frequency  Yellow: Q 2hr X 2 then Q 4hr X 2, if remains yellow, continue Q 4hrs  Escalate  MEWS: Escalate Yellow: discuss with charge nurse/RN and consider discussing with provider and RRT  Notify: Charge Nurse/RN  Name of Charge Nurse/RN Notified Debbie RN  Date Charge Nurse/RN Notified 08/11/19  Time Charge Nurse/RN Notified 0539   Pt complaining of getting no pain relief. New PCA syringe and scheduled IV toradol given. She said she think it is because of the IV site. IV flushed without difficulty, no blood return, pt is not complaining of pain at IV site. IV  Order initiated for IV team consult. Will continue to monitor the patient.

## 2019-08-11 NOTE — Progress Notes (Signed)
   08/11/19 1927  Assess: MEWS Score  Temp 99.4 F (37.4 C)  BP 126/87  Pulse Rate (!) 109  Resp 13  SpO2 96 %  Assess: MEWS Score  MEWS Temp 0  MEWS Systolic 0  MEWS Pulse 1  MEWS RR 1  MEWS LOC 0  MEWS Score 2  MEWS Score Color Yellow  Assess: if the MEWS score is Yellow or Red  Were vital signs taken at a resting state? Yes  Focused Assessment Documented focused assessment  Early Detection of Sepsis Score *See Row Information* Low  Treat  MEWS Interventions Other (Comment) (Respirations 18 )  Notify: Charge Nurse/RN  Name of Charge Nurse/RN Notified Debbie RN  Date Charge Nurse/RN Notified 08/11/19  Time Charge Nurse/RN Notified 1927   Patient yellow mews due to respirations 13, rechecked by this nurse and they are 18. Charge nurse notified, yellow mews protocol not initiated. Will continue to monitor the patient.

## 2019-08-11 NOTE — Progress Notes (Signed)
°   08/11/19 1319  Assess: MEWS Score  Temp 98.2 F (36.8 C)  BP 120/62  Pulse Rate (!) 106  Resp 18  SpO2 96 %  Assess: MEWS Score  MEWS Temp 0  MEWS Systolic 0  MEWS Pulse 1  MEWS RR 0  MEWS LOC 0  MEWS Score 1  MEWS Score Color Green  Assess: if the MEWS score is Yellow or Red  Were vital signs taken at a resting state? Yes  Focused Assessment Documented focused assessment  Early Detection of Sepsis Score *See Row Information* Low    After encouraging patient to use PCA.  Patient's patient's pain was better controlled and mews turned green.  Levora Angel, RN

## 2019-08-12 MED ORDER — OXYCODONE HCL 5 MG PO TABS
20.0000 mg | ORAL_TABLET | ORAL | Status: DC | PRN
  Administered 2019-08-12 – 2019-08-14 (×11): 20 mg via ORAL
  Filled 2019-08-12 (×11): qty 4

## 2019-08-12 MED ORDER — OXYCODONE HCL 5 MG PO TABS: 20.0000 mg | ORAL_TABLET | Freq: Four times a day (QID) | ORAL | Status: DC | PRN

## 2019-08-12 NOTE — Progress Notes (Signed)
Subjective: Stephanie Burch is a 39 year old female with a medical history significant for sickle cell disease, chronic pain syndrome, opiate dependence and tolerance, depression and anxiety, anemia of chronic disease, and morbid obesity was admitted for sickle cell pain crisis.  Patient states the pain has improved minimally overnight.  She rates pain as 7/10 primarily to right lower extremity.  Patient has no new complaints on today.  She denies headache, shortness of breath, chest pain, urinary symptoms, nausea, vomiting, or diarrhea.  Objective:  Vital signs in last 24 hours:  Vitals:   08/12/19 0327 08/12/19 0735 08/12/19 1113 08/12/19 1117  BP: 127/72  (!) 129/59   Pulse: (!) 104  (!) 107   Resp: 14 20 18 14   Temp: 98.8 F (37.1 C)  97.9 F (36.6 C)   TempSrc:   Oral   SpO2: 99% 99% 99% 97%  Weight:      Height:        Intake/Output from previous day:  No intake or output data in the 24 hours ending 08/12/19 1129  Physical Exam: General: Alert, awake, oriented x3, in no acute distress.  HEENT: [place]/AT PEERL, EOMI Neck: Trachea midline,  no masses, no thyromegal,y no JVD, no carotid bruit OROPHARYNX:  Moist, No exudate/ erythema/lesions.  Heart: Regular rate and rhythm, without murmurs, rubs, gallops, PMI non-displaced, no heaves or thrills on palpation.  Lungs: Clear to auscultation, no wheezing or rhonchi noted. No increased vocal fremitus resonant to percussion  Abdomen: Soft, nontender, nondistended, positive bowel sounds, no masses no hepatosplenomegaly noted..  Neuro: No focal neurological deficits noted cranial nerves II through XII grossly intact. DTRs 2+ bilaterally upper and lower extremities. Strength 5 out of 5 in bilateral upper and lower extremities. Musculoskeletal: No warm swelling or erythema around joints, no spinal tenderness noted. Psychiatric: Patient alert and oriented x3, good insight and cognition, good recent to remote recall. Lymph node survey: No  cervical axillary or inguinal lymphadenopathy noted.  Lab Results:  Basic Metabolic Panel:    Component Value Date/Time   NA 138 08/11/2019 0850   NA 142 09/11/2017 1431   K 4.5 08/11/2019 0850   CL 103 08/11/2019 0850   CO2 26 08/11/2019 0850   BUN 12 08/11/2019 0850   BUN 8 09/11/2017 1431   CREATININE 0.60 08/11/2019 0850   GLUCOSE 108 (H) 08/11/2019 0850   CALCIUM 8.8 (L) 08/11/2019 0850   CBC:    Component Value Date/Time   WBC 13.1 (H) 08/11/2019 0850   HGB 10.6 (L) 08/11/2019 0850   HGB 13.0 09/11/2017 1431   HCT 30.5 (L) 08/11/2019 0850   HCT 38.4 09/11/2017 1431   PLT 186 08/11/2019 0850   PLT 258 09/11/2017 1431   MCV 69.5 (L) 08/11/2019 0850   MCV 75 (L) 09/11/2017 1431   NEUTROABS 8.4 (H) 08/10/2019 1115   NEUTROABS 3.9 09/11/2017 1431   LYMPHSABS 2.7 08/10/2019 1115   LYMPHSABS 2.2 09/11/2017 1431   MONOABS 1.3 (H) 08/10/2019 1115   EOSABS 0.5 08/10/2019 1115   EOSABS 0.1 09/11/2017 1431   BASOSABS 0.1 08/10/2019 1115   BASOSABS 0.0 09/11/2017 1431    Recent Results (from the past 240 hour(s))  SARS CORONAVIRUS 2 (TAT 6-24 HRS) Nasopharyngeal Nasopharyngeal Swab     Status: None   Collection Time: 08/10/19  6:26 PM   Specimen: Nasopharyngeal Swab  Result Value Ref Range Status   SARS Coronavirus 2 NEGATIVE NEGATIVE Final    Comment: (NOTE) SARS-CoV-2 target nucleic acids are NOT  DETECTED.  The SARS-CoV-2 RNA is generally detectable in upper and lower respiratory specimens during the acute phase of infection. Negative results do not preclude SARS-CoV-2 infection, do not rule out co-infections with other pathogens, and should not be used as the sole basis for treatment or other patient management decisions. Negative results must be combined with clinical observations, patient history, and epidemiological information. The expected result is Negative.  Fact Sheet for Patients: HairSlick.no  Fact Sheet for Healthcare  Providers: quierodirigir.com  This test is not yet approved or cleared by the [place] FDA and  has been authorized for detection and/or diagnosis of SARS-CoV-2 by FDA under an Emergency Use Authorization (EUA). This EUA will remain  in effect (meaning this test can be used) for the duration of the COVID-19 declaration under Se ction 564(b)(1) of the Act, 21 U.S.C. section 360bbb-3(b)(1), unless the authorization is terminated or revoked sooner.  Performed at [facility], [address] [place], [place] [address]     Studies/Results: No results found.  Medications: Scheduled Meds: . DULoxetine  20 mg Oral Daily  . enoxaparin (LOVENOX) injection  40 mg Subcutaneous Q24H  . folic acid  1 mg Oral Daily  . HYDROmorphone   Intravenous Q4H  . hydroxyurea  2,000 mg Oral Daily  . ketorolac  15 mg Intravenous Q6H  . oxyCODONE  15 mg Oral Q12H  . senna-docusate  1 tablet Oral BID  . sodium chloride flush  10-40 mL Intracatheter Q12H  . voxelotor  1,500 mg Oral Daily   Continuous Infusions: . sodium chloride 10 mL/hr at 08/12/19 1108   PRN Meds:.diphenhydrAMINE, naloxone **AND** sodium chloride flush, ondansetron (ZOFRAN) IV, oxyCODONE, polyethylene glycol, sodium chloride flush  Consultants:  None  Procedures:  None  Antibiotics:  None  Assessment/Plan: Principal Problem:   Sickle cell pain crisis (HCC) Active Problems:   Leukocytosis   Morbidly obese (HCC)   Chronic pain syndrome   Anxiety  Sickle cell disease with pain crisis: Continue IV Dilaudid PCA, no changes in settings on today Continue IV fluids at [facility] Oxycodone 20 mg every 4 hours as needed for severe breakthrough pain Toradol 15 mg IV every 6 hours for total of 5 days Monitor vital signs closely, reevaluate pain scale regularly, and supplemental oxygen as needed.  Chronic pain syndrome: Continue home medications  Leukocytosis: Stable.  Patient is afebrile  without any signs of infection or inflammation.  Continue to follow CBC.  Sickle cell anemia: Patient's hemoglobin continues to be consistent with baseline.  There is no clinical indication for blood transfusion at this time.  Continue to monitor closely.  History of depression and anxiety: Stable.  Continue home medications.  Patient denies any suicidal or homicidal intent.  Morbid obesity: Patient previously counseled at length.   Code Status: Full Code Family Communication: N/A Disposition Plan: Not yet ready for discharge  Cheria Sadiq Rennis Petty  APRN, MSN, FNP-C [facility] [address] [place], [place] [address] [phone] If 5PM-7AM, please contact night-coverage.  08/12/2019, 11:29 AM  LOS: 2 days

## 2019-08-13 DIAGNOSIS — D72829 Elevated white blood cell count, unspecified: Secondary | ICD-10-CM

## 2019-08-13 LAB — CBC
HCT: 28.2 % — ABNORMAL LOW (ref 36.0–46.0)
Hemoglobin: 9.8 g/dL — ABNORMAL LOW (ref 12.0–15.0)
MCH: 24.7 pg — ABNORMAL LOW (ref 26.0–34.0)
MCHC: 34.8 g/dL (ref 30.0–36.0)
MCV: 71 fL — ABNORMAL LOW (ref 80.0–100.0)
Platelets: 171 10*3/uL (ref 150–400)
RBC: 3.97 MIL/uL (ref 3.87–5.11)
RDW: 15.8 % — ABNORMAL HIGH (ref 11.5–15.5)
WBC: 14.2 10*3/uL — ABNORMAL HIGH (ref 4.0–10.5)
nRBC: 2.5 % — ABNORMAL HIGH (ref 0.0–0.2)

## 2019-08-13 NOTE — Progress Notes (Signed)
Patient ID: Stephanie Burch, female   DOB: 08-17-1980, 39 y.o.   MRN: [id number] Subjective: Stephanie Burch is a 39 year old female with a medical history significant for sickle cell disease, chronic pain syndrome, opiate dependence and tolerance, depression and anxiety, anemia of chronic disease, and morbid obesity was admitted for sickle cell pain crisis.  Patient is feeling much better today, although her pain is still at about 6/10 primarily to her lower extremities.  Patient has no new complaint today.  She denies any headache, fever, cough, shortness of breath, chest pain, urinary symptoms, nausea, vomiting or diarrhea.  Objective:  Vital signs in last 24 hours:  Vitals:   08/13/19 0657 08/13/19 0707 08/13/19 0823 08/13/19 0823  BP:    138/69  Pulse:  (!) 121  (!) 104  Resp: (!) 8 13 20 18   Temp:    99.5 F (37.5 C)  TempSrc:    Oral  SpO2: 100% 99% 98% 94%  Weight:      Height:        Intake/Output from previous day:   Intake/Output Summary (Last 24 hours) at 08/13/2019 1224 Last data filed at 08/13/2019 0900 Gross per 24 hour  Intake 1432 ml  Output --  Net 1432 ml    Physical Exam: General: Alert, awake, oriented x3, in no acute distress.  Morbidly obese HEENT: [place]/AT PEERL, EOMI Neck: Trachea midline,  no masses, no thyromegal,y no JVD, no carotid bruit OROPHARYNX:  Moist, No exudate/ erythema/lesions.  Heart: Regular rate and rhythm, without murmurs, rubs, gallops, PMI non-displaced, no heaves or thrills on palpation.  Lungs: Clear to auscultation, no wheezing or rhonchi noted. No increased vocal fremitus resonant to percussion  Abdomen: Soft, nontender, nondistended, positive bowel sounds, no masses no hepatosplenomegaly noted..  Neuro: No focal neurological deficits noted cranial nerves II through XII grossly intact. DTRs 2+ bilaterally upper and lower extremities. Strength 5 out of 5 in bilateral upper and lower extremities. Musculoskeletal: No warm swelling or  erythema around joints, no spinal tenderness noted. Psychiatric: Patient alert and oriented x3, good insight and cognition, good recent to remote recall. Lymph node survey: No cervical axillary or inguinal lymphadenopathy noted.  Lab Results:  Basic Metabolic Panel:    Component Value Date/Time   NA 138 08/11/2019 0850   NA 142 09/11/2017 1431   K 4.5 08/11/2019 0850   CL 103 08/11/2019 0850   CO2 26 08/11/2019 0850   BUN 12 08/11/2019 0850   BUN 8 09/11/2017 1431   CREATININE 0.60 08/11/2019 0850   GLUCOSE 108 (H) 08/11/2019 0850   CALCIUM 8.8 (L) 08/11/2019 0850   CBC:    Component Value Date/Time   WBC 14.2 (H) 08/13/2019 0829   HGB 9.8 (L) 08/13/2019 0829   HGB 13.0 09/11/2017 1431   HCT 28.2 (L) 08/13/2019 0829   HCT 38.4 09/11/2017 1431   PLT 171 08/13/2019 0829   PLT 258 09/11/2017 1431   MCV 71.0 (L) 08/13/2019 0829   MCV 75 (L) 09/11/2017 1431   NEUTROABS 8.4 (H) 08/10/2019 1115   NEUTROABS 3.9 09/11/2017 1431   LYMPHSABS 2.7 08/10/2019 1115   LYMPHSABS 2.2 09/11/2017 1431   MONOABS 1.3 (H) 08/10/2019 1115   EOSABS 0.5 08/10/2019 1115   EOSABS 0.1 09/11/2017 1431   BASOSABS 0.1 08/10/2019 1115   BASOSABS 0.0 09/11/2017 1431    Recent Results (from the past 240 hour(s))  SARS CORONAVIRUS 2 (TAT 6-24 HRS) Nasopharyngeal Nasopharyngeal Swab     Status: None  Collection Time: 08/10/19  6:26 PM   Specimen: Nasopharyngeal Swab  Result Value Ref Range Status   SARS Coronavirus 2 NEGATIVE NEGATIVE Final    Comment: (NOTE) SARS-CoV-2 target nucleic acids are NOT DETECTED.  The SARS-CoV-2 RNA is generally detectable in upper and lower respiratory specimens during the acute phase of infection. Negative results do not preclude SARS-CoV-2 infection, do not rule out co-infections with other pathogens, and should not be used as the sole basis for treatment or other patient management decisions. Negative results must be combined with clinical observations, patient  history, and epidemiological information. The expected result is Negative.  Fact Sheet for Patients: HairSlick.no  Fact Sheet for Healthcare Providers: quierodirigir.com  This test is not yet approved or cleared by the [place] FDA and  has been authorized for detection and/or diagnosis of SARS-CoV-2 by FDA under an Emergency Use Authorization (EUA). This EUA will remain  in effect (meaning this test can be used) for the duration of the COVID-19 declaration under Se ction 564(b)(1) of the Act, 21 U.S.C. section 360bbb-3(b)(1), unless the authorization is terminated or revoked sooner.  Performed at [facility], [address] [place], [place] [address]     Studies/Results: No results found.  Medications: Scheduled Meds: . DULoxetine  20 mg Oral Daily  . enoxaparin (LOVENOX) injection  40 mg Subcutaneous Q24H  . folic acid  1 mg Oral Daily  . HYDROmorphone   Intravenous Q4H  . hydroxyurea  2,000 mg Oral Daily  . ketorolac  15 mg Intravenous Q6H  . oxyCODONE  15 mg Oral Q12H  . senna-docusate  1 tablet Oral BID  . sodium chloride flush  10-40 mL Intracatheter Q12H  . voxelotor  1,500 mg Oral Daily   Continuous Infusions: . sodium chloride 10 mL/hr at 08/12/19 1108   PRN Meds:.diphenhydrAMINE, naloxone **AND** sodium chloride flush, ondansetron (ZOFRAN) IV, oxyCODONE, polyethylene glycol, sodium chloride flush  Consultants:  None  Procedures:  None  Antibiotics:  None  Assessment/Plan: Principal Problem:   Sickle cell pain crisis (HCC) Active Problems:   Leukocytosis   Morbidly obese (HCC)   Chronic pain syndrome   Anxiety  1. Hb Sickle Cell Disease with crisis: Continue IVF at [facility], continue weight based Dilaudid PCA at current setting, continue IV Toradol 15 mg Q 6 H for a total of 5 days, continue oral home pain medications as ordered, monitor vitals very closely, Re-evaluate pain scale  regularly, 2 L of Oxygen by [place]. 2. Leukocytosis: Improved.  Patient is afebrile with no symptoms or signs of infection or inflammation.  Continue to monitor closely. 3. 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  There is no clinical indication for blood transfusion today. 4. Chronic pain Syndrome: Continue home pain medications as ordered. 5. Major depression and anxiety: Patient is clinically stable.  She denies any suicidal ideations or thoughts.  Continue home medications.  Patient extensively counseled. 6. Morbid obesity: Patient counseled extensively about nutrition and gentle exercise as tolerated.  Patient verbalized understanding.  Code Status: Full Code Family Communication: N/A Disposition Plan: Not yet ready for discharge  Ingeborg Fite  If 7PM-7AM, please contact night-coverage.  08/13/2019, 12:24 PM  LOS: 3 days

## 2019-08-13 NOTE — Progress Notes (Signed)
   08/13/19 0012  Assess: MEWS Score  Temp 99.5 F (37.5 C)  BP (!) 120/51  Pulse Rate (!) 113  Resp (!) 24  Level of Consciousness Alert  SpO2 93 %  O2 Device Nasal Cannula  Assess: MEWS Score  MEWS Temp 0  MEWS Systolic 0  MEWS Pulse 2  MEWS RR 1  MEWS LOC 0  MEWS Score 3  MEWS Score Color Yellow  Assess: if the MEWS score is Yellow or Red  Were vital signs taken at a resting state? Yes  Focused Assessment Documented focused assessment  Early Detection of Sepsis Score *See Row Information* Low  MEWS guidelines implemented *See Row Information* No, vital signs rechecked  Treat  MEWS Interventions Administered scheduled meds/treatments  pt is having increased pain - toradol & oxy- given will cont to monitor

## 2019-08-13 NOTE — Progress Notes (Signed)
   08/13/19 0651  Assess: MEWS Score  Pulse Rate (!) 112  Resp (!) 28  Level of Consciousness Alert  SpO2 90 %  O2 Device Room Air  Assess: MEWS Score  MEWS Temp 0  MEWS Systolic 0  MEWS Pulse 2  MEWS RR 2  MEWS LOC 0  MEWS Score 4  MEWS Score Color Red  Assess: if the MEWS score is Yellow or Red  Were vital signs taken at a resting state? Yes  Focused Assessment Documented focused assessment  Early Detection of Sepsis Score *See Row Information* Low  MEWS guidelines implemented *See Row Information* No, previously yellow, continue vital signs every 4 hours  Treat  MEWS Interventions Other (Comment)  Take Vital Signs  Increase Vital Sign Frequency  Yellow: Q 2hr X 2 then Q 4hr X 2, if remains yellow, continue Q 4hrs  went to bedside to give scheduled Toradol to find pt in a deep sleep  With VS as noted. Awoke pt to have he take slow deep breaths and she was very groggy but then pressed PCA button and asked for Oxy.

## 2019-08-14 ENCOUNTER — Other Ambulatory Visit: Payer: Self-pay | Admitting: Family Medicine

## 2019-08-14 ENCOUNTER — Other Ambulatory Visit: Payer: Self-pay | Admitting: Internal Medicine

## 2019-08-14 DIAGNOSIS — M62838 Other muscle spasm: Secondary | ICD-10-CM

## 2019-08-14 DIAGNOSIS — D571 Sickle-cell disease without crisis: Secondary | ICD-10-CM

## 2019-08-14 NOTE — Progress Notes (Signed)
Per MD discharge orders, the pt was to be left connected to her PCA pump and to continue to receive all medications until 2 pm. Pt informed the RN that her pain was a 5/10 and that she wanted to know if she could stay connected to the PCA pump for another couple of hours. RN called MD and Dr. Hyman Hopes informed me that it was okay to leave the pt connected for another 1-2 hours. Pt agreed to have the PCA disconnected at 3 pm. Will continue to monitor and update MD on any other changes.

## 2019-08-14 NOTE — Discharge Summary (Signed)
Physician Discharge Summary  Stephanie Burch MWN:[id number] DOB: 10/14/1980 DOA: 08/10/2019  PCP: Kallie Locks, FNP  Admit date: 08/10/2019  Discharge date: 08/14/2019  Discharge Diagnoses:  Principal Problem:   Sickle cell pain crisis (HCC) Active Problems:   Leukocytosis   Morbidly obese (HCC)   Chronic pain syndrome   Anxiety   Discharge Condition: Stable  Disposition:   Follow-up Information    Kallie Locks, FNP. Schedule an appointment as soon as possible for a visit in 1 week(s).   Specialty: Family Medicine Contact information: [address] [place] [address] [phone]              Pt is discharged home in good condition and is to follow up with Kallie Locks, FNP this week to have labs evaluated. Terrah Delpino is instructed to increase activity slowly and balance with rest for the next few days, and use prescribed medication to complete treatment of pain  Diet: Regular Wt Readings from Last 3 Encounters:  08/10/19 132.9 kg  07/22/19 132.9 kg  06/26/19 120.2 kg   History of present illness:  Stephanie Burch  is a 39 y.o. female [name] is a 39 year old female with a medical history significant for sickle cell disease, chronic pain syndrome, opiate dependence and tolerance, history of anemia of chronic disease, and history of depression anxiety presents complaining of right lower extremity pain that is consistent with her previous sickle cell pain crisis.  Patient states that pain occurred suddenly on yesterday and has been constant and sharp since that time.  She has been taking prescribed pain medications around-the-clock without sustained relief.  She last had oxycodone this a.m. which was ineffective.  Pain intensity is 10/10.  Patient states that it has been difficult to bear weight. She denies any falls.  No edema or erythema.  Patient denies fever, chills, chest pain, shortness of breath, persistent cough, urinary symptoms, nausea,  vomiting, or diarrhea. She also denies any sick contacts, recent travel, or exposure to COVID-19.  Sickle cell day infusion center:  Patient found to have vitals of BP 127/70 (BP Location: Left Arm)   Pulse (!) 111   Temp 98.8 F (37.1 C) (Oral)   Resp 13   Ht 5\' 5"  (1.651 m)   Wt 132.9 kg   LMP 07/07/2019   SpO2 96%   BMI 48.76 kg/m Patient's vital signs are consistent with her baseline. COVID 19 test pending. Pain persists despite IV dilaudid PCA, Toradol, Tylenol, and IV fluids. Patient admitted to med surg for further management of sickle cell pain crisis.   Hospital Course:  Patient was admitted for sickle cell pain crisis and managed appropriately with IVF, IV Dilaudid via PCA and IV Toradol, as well as other adjunct therapies per sickle cell pain management protocols.  Patient's symptoms slowly improved with above regimen, pain slowly returned to baseline. Hemoglobin was stable at baseline throughout this admission, she did not require any blood transfusion. Patient was noticed to have tachycardia as a response to her pain intensity, but this returned to within normal limits as pain control was achieved.  As at today, patient is doing really well, eating and drinking well with no restrictions, ambulating well with no significant pain.  She says she is ready for discharge home and was requesting a prescription of her short acting pain medications.  On review of her chart, prescription has been sent to the pharmacy on 08/12/2019.  Patient will pick prescription up on her way home.  Patient was discharged home today in a hemodynamically stable condition.  She will follow up with her PCP within 1 week of this discharge.  Patient was counseled extensively about other nonpharmacologic means of pain control, she verbalized understanding and was appreciative of her care during this admission.  Discharge Exam: Vitals:   08/14/19 0812 08/14/19 1053  BP: 112/67   Pulse: 97   Resp: 16 14  Temp: 98.5  F (36.9 C)   SpO2: 96% 98%   Vitals:   08/14/19 0423 08/14/19 0425 08/14/19 0812 08/14/19 1053  BP:  (!) 108/58 112/67   Pulse:  (!) 109 97   Resp: (!) 24 20 16 14   Temp:  98.1 F (36.7 C) 98.5 F (36.9 C)   TempSrc:   Oral   SpO2: 91% 94% 96% 98%  Weight:      Height:       General appearance : Awake, alert, not in any distress. Speech Clear. Not toxic looking.  Obese HEENT: Atraumatic and Normocephalic, pupils equally reactive to light and accomodation Neck: Supple, no JVD. No cervical lymphadenopathy.  Chest: Good air entry bilaterally, no added sounds  CVS: S1 S2 regular, no murmurs.  Abdomen: Bowel sounds present, Non tender and not distended with no gaurding, rigidity or rebound. Extremities: B/L Lower Ext shows no edema, both legs are warm to touch Neurology: Awake alert, and oriented X 3, CN II-XII intact, Non focal Skin: No Rash  Discharge Instructions  Discharge Instructions    Diet - low sodium heart healthy   Complete by: As directed    Increase activity slowly   Complete by: As directed      Allergies as of 08/14/2019   No Known Allergies     Medication List    TAKE these medications   DULoxetine 20 MG capsule Commonly known as: CYMBALTA Take 20 mg by mouth daily.   folic acid 1 MG tablet Commonly known as: FOLVITE Take 1 tablet (1 mg total) by mouth daily.   hydroxyurea 500 MG capsule Commonly known as: HYDREA Take 2,000 mg by mouth daily. May take with food to minimize GI side effects.   ibuprofen 800 MG tablet Commonly known as: ADVIL TAKE 1 TABLET (800 MG TOTAL) BY MOUTH EVERY 8 (EIGHT) HOURS AS NEEDED FOR HEADACHE, MILD PAIN, MODERATE PAIN OR CRAMPING.   naloxone 0.4 MG/ML injection Commonly known as: NARCAN As needed What changed:   how much to take  how to take this  when to take this  reasons to take this   ondansetron 4 MG tablet Commonly known as: ZOFRAN TAKE 1 TABLET BY MOUTH EVERY 8 HOURS AS NEEDED FOR NAUSEA AND  VOMITING What changed: See the new instructions.   Oxbryta 500 MG Tabs tablet Generic drug: voxelotor Take 1,500 mg by mouth daily. For Sickle Cell Anemia   Oxycodone HCl 20 MG Tabs Take 1 tablet (20 mg total) by mouth every 4 (four) hours as needed (for pain).   tiZANidine 4 MG capsule Commonly known as: ZANAFLEX Take 1 capsule (4 mg total) by mouth 3 (three) times daily. What changed:   when to take this  reasons to take this   Vitamin D (Ergocalciferol) 1.25 MG (50000 UNIT) Caps capsule Commonly known as: DRISDOL Take 1 capsule (50,000 Units total) by mouth every 7 (seven) days.   Xtampza ER 18 MG C12a Generic drug: oxyCODONE ER Take 18 mg by mouth every 12 (twelve) hours.       The results of  significant diagnostics from this hospitalization (including imaging, microbiology, ancillary and laboratory) are listed below for reference.    Significant Diagnostic Studies: No results found.  Microbiology: Recent Results (from the past 240 hour(s))  SARS CORONAVIRUS 2 (TAT 6-24 HRS) Nasopharyngeal Nasopharyngeal Swab     Status: None   Collection Time: 08/10/19  6:26 PM   Specimen: Nasopharyngeal Swab  Result Value Ref Range Status   SARS Coronavirus 2 NEGATIVE NEGATIVE Final    Comment: (NOTE) SARS-CoV-2 target nucleic acids are NOT DETECTED.  The SARS-CoV-2 RNA is generally detectable in upper and lower respiratory specimens during the acute phase of infection. Negative results do not preclude SARS-CoV-2 infection, do not rule out co-infections with other pathogens, and should not be used as the sole basis for treatment or other patient management decisions. Negative results must be combined with clinical observations, patient history, and epidemiological information. The expected result is Negative.  Fact Sheet for Patients: HairSlick.no  Fact Sheet for Healthcare Providers: quierodirigir.com  This test is  not yet approved or cleared by the [place] FDA and  has been authorized for detection and/or diagnosis of SARS-CoV-2 by FDA under an Emergency Use Authorization (EUA). This EUA will remain  in effect (meaning this test can be used) for the duration of the COVID-19 declaration under Se ction 564(b)(1) of the Act, 21 U.S.C. section 360bbb-3(b)(1), unless the authorization is terminated or revoked sooner.  Performed at [facility], [address], [place], [place] [address]      Labs: Basic Metabolic Panel: Recent Labs  Lab 08/10/19 1115 08/11/19 0850  NA 138 138  K 4.6 4.5  CL 101 103  CO2 25 26  GLUCOSE 103* 108*  BUN 11 12  CREATININE 0.58 0.60  CALCIUM 9.1 8.8*   Liver Function Tests: Recent Labs  Lab 08/10/19 1115  AST 23  ALT 36  ALKPHOS 76  BILITOT 1.2  PROT 7.3  ALBUMIN 3.8   No results for input(s): LIPASE, AMYLASE in the last 168 hours. No results for input(s): AMMONIA in the last 168 hours. CBC: Recent Labs  Lab 08/10/19 1115 08/11/19 0850 08/13/19 0829  WBC 13.0* 13.1* 14.2*  NEUTROABS 8.4*  --   --   HGB 11.1* 10.6* 9.8*  HCT 32.5* 30.5* 28.2*  MCV 71.3* 69.5* 71.0*  PLT 185 186 171   Cardiac Enzymes: No results for input(s): CKTOTAL, CKMB, CKMBINDEX, TROPONINI in the last 168 hours. BNP: Invalid input(s): POCBNP CBG: No results for input(s): GLUCAP in the last 168 hours.  Time coordinating discharge: 50 minutes  Signed:  Creek Gan  [facility] 08/14/2019, 11:42 AM

## 2019-08-14 NOTE — Progress Notes (Signed)
Pt being discharged to home. The pt drove herself to the hospital and has no other way of getting home, the pt wanted to sit and wait a couple of hours before driving herself home since she was disconnected from the PCA pump at 3 pm. The pt feels safe to drive herself home. Discharge instructions and medication education were provided to the pt.

## 2019-08-15 ENCOUNTER — Other Ambulatory Visit: Payer: Self-pay | Admitting: Family Medicine

## 2019-08-15 DIAGNOSIS — M62838 Other muscle spasm: Secondary | ICD-10-CM

## 2019-08-15 DIAGNOSIS — D571 Sickle-cell disease without crisis: Secondary | ICD-10-CM

## 2019-08-15 MED ORDER — IBUPROFEN 800 MG PO TABS: 800.0000 mg | ORAL_TABLET | Freq: Three times a day (TID) | ORAL | 6 refills | Status: DC | PRN

## 2019-08-15 NOTE — Telephone Encounter (Signed)
Please review  Thank you

## 2019-08-22 ENCOUNTER — Ambulatory Visit (INDEPENDENT_AMBULATORY_CARE_PROVIDER_SITE_OTHER): Payer: Medicaid Other | Admitting: Family Medicine

## 2019-08-22 ENCOUNTER — Ambulatory Visit (INDEPENDENT_AMBULATORY_CARE_PROVIDER_SITE_OTHER): Payer: Medicaid Other | Admitting: Clinical

## 2019-08-22 ENCOUNTER — Encounter: Payer: Self-pay | Admitting: Family Medicine

## 2019-08-22 ENCOUNTER — Other Ambulatory Visit: Payer: Self-pay

## 2019-08-22 VITALS — BP 124/84 | HR 93 | Temp 98.2°F | Resp 18 | Ht 65.0 in

## 2019-08-22 DIAGNOSIS — F419 Anxiety disorder, unspecified: Secondary | ICD-10-CM | POA: Diagnosis not present

## 2019-08-22 DIAGNOSIS — G894 Chronic pain syndrome: Secondary | ICD-10-CM | POA: Diagnosis not present

## 2019-08-22 DIAGNOSIS — F1190 Opioid use, unspecified, uncomplicated: Secondary | ICD-10-CM

## 2019-08-22 DIAGNOSIS — D571 Sickle-cell disease without crisis: Secondary | ICD-10-CM

## 2019-08-22 DIAGNOSIS — Z09 Encounter for follow-up examination after completed treatment for conditions other than malignant neoplasm: Secondary | ICD-10-CM

## 2019-08-22 NOTE — BH Specialist Note (Signed)
Integrated Behavioral Health Follow Up Visit  MRN: [id number] Name: Stephanie Burch  Number of Integrated Behavioral Health Clinician visits: 13/20 Session Start time: 1:40  Session End time: 2:25 Total time: 45   Type of Service: Integrated Behavioral Health- Individual/Family Interpretor:No. Interpretor Name and Language: none  SUBJECTIVE: Stephanie Burch is a 39 y.o. female accompanied by self Patient was referred by NP for stress precipitating sickle cell crisis, recently also for overuse of pain medications. 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  Reduce symptoms of: anxiety and stress  2.  Increase knowledge and/or ability of: coping skills and self-management skills  3.  Demonstrate ability to: Increase healthy adjustment to current life circumstances  INTERVENTIONS: Interventions utilized:  CBT, ACT Standardized Assessments completed: Not Needed  ASSESSMENT: Patient currently experiencing difficulty in managing sickle cell pain and is distressed by crises that lead to hospitalization. Patient has also been stressed about difficulties in intimate relationships. CBT today to explore and challenge unhelpful thoughts. ACT to explore pain and  illness acceptance and themes of control.  Patient is making progress towards goal.  States that she uses positive coping strategies at home. This is also evident by patient's awareness of unhelpful thoughts.   Patient may benefit from continued supportive counseling including CBT and ACT approaches to challenging and thoughts and core beliefs about self, relationships, and pain.  PLAN: 1. Follow up with behavioral health clinician on: 09/21/19 2. Behavioral recommendations:  3. Referral(s): Integrated Behavioral Health Services (In Clinic) 4. "From scale of 1-10, how likely are you to follow plan?":   Stephanie Butts, LCSW [facility] [phone]

## 2019-08-22 NOTE — Progress Notes (Signed)
[facility] Internal Medicine and Sickle Cell Care   Established Patient Office Visit  Subjective:  Patient ID: Stephanie Burch, female    DOB: [birth year]  Age: 39 y.o. MRN: [id number]  CC:  Chief Complaint  Patient presents with  . Follow-up    Pt states she is here for her f/u appt after discharged from hospital.08/14/19. Pt states she dosen't really have any question or concerns to express to NP Bradly ChrisStroud.. Pt states she just started feeling better yesterday since being discharged.    HPI Stephanie Burch is a [id number] females who presents for Follow Up today.    Patient Active Problem List   Diagnosis Date Noted  . Urinary frequency 04/20/2019  . Vaginal odor 04/20/2019  . Chest pain varying with breathing   . Fever and chills   . Anxiety 11/15/2018  . Vitamin D deficiency 11/15/2018  . Nausea 11/15/2018  . Abnormal pulse oximetry   . Hypokalemia 09/20/2018  . Medication management 09/14/2018  . Muscle spasms of both lower extremities 08/25/2018  . Subjective visual disturbance of right eye 08/25/2018  . 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 Chronic pain syndrome 03/16/2018  . Chronic, continuous use of opioids 03/16/2018  . Tachycardia with heart rate 100-120 beats per minute   . Sickle cell anemia with crisis (HCC) 10/19/2017  . Leukocytosis 10/19/2017  . Thrombocytopenia (HCC) 10/19/2017  . Morbidly obese (HCC) 10/19/2017  . Sickle cell pain crisis (HCC) 10/15/2017     Past Medical History:  Diagnosis Date  . Nausea   . Sickle cell anemia (HCC)   . Vitamin B12 deficiency 12/2018  . Vitamin D deficiency     Current Status: Since her last office visit, she is doing well with no complaints. She states that she has pain in her right legs. She rates her pain today at 3/10. She has not had a hospital visit for Sickle Cell Crisis since 08/10/2019 where she was treated and discharged 08/14/2019. She is currently taking all medications as prescribed and  staying well hydrated. She reports occasional nausea, constipation, dizziness and headaches. She states that her pain is more manageable ---increase in Long Acting to 18 mg. She denies fevers, chills, fatigue, recent infections, weight loss, and night sweats. She has not had any visual changes, and falls. No chest pain, heart palpitations, cough and shortness of breath reported. Denies GI problems such as vomiting, and diarrhea. She has no reports of blood in stools, dysuria and hematuria. Her anxiety is moderate today, r/t healthcare status and pain management. She is taking all medications as prescribed.   No past surgical history on file.  Family History  Problem Relation Age of Onset  . Hypertension Mother   . Sickle cell trait Mother   . Diabetes Father   . Sickle cell trait Father     Social History   Socioeconomic History  . Marital status: Single    Spouse name: Not on file  . Number of children: Not on file  . Years of education: Not on file  . Highest education level: Not on file  Occupational History  . Not on file  Tobacco Use  . Smoking status: Never Smoker  . Smokeless tobacco: Never Used  Vaping Use  . Vaping Use: Never used  Substance and Sexual Activity  . Alcohol use: Not Currently  . Drug use: Never  . Sexual activity: Yes    Birth control/protection: None  Other Topics Concern  . Not on file  Social History Narrative  . Not on file   Social Determinants of Health   Financial Resource Strain:   . Difficulty of Paying Living Expenses:   Food Insecurity:   . Worried About Programme researcher, broadcasting/film/video in the Last Year:   . Barista in the Last Year:   Transportation Needs:   . Freight forwarder (Medical):   Marland Kitchen Lack of Transportation (Non-Medical):   Physical Activity:   . Days of Exercise per Week:   . Minutes of Exercise per Session:   Stress:   . Feeling of Stress :   Social Connections:   . Frequency of Communication with Friends and Family:     . Frequency of Social Gatherings with Friends and Family:   . Attends Religious Services:   . Active Member of Clubs or Organizations:   . Attends Banker Meetings:   Marland Kitchen Marital Status:   Intimate Partner Violence:   . Fear of Current or Ex-Partner:   . Emotionally Abused:   Marland Kitchen Physically Abused:   . Sexually Abused:     Outpatient Medications Prior to Visit  Medication Sig Dispense Refill  . folic acid (FOLVITE) 1 MG tablet Take 1 tablet (1 mg total) by mouth daily. 30 tablet 11  . ibuprofen (ADVIL) 800 MG tablet Take 1 tablet (800 mg total) by mouth every 8 (eight) hours as needed for headache, mild pain, moderate pain or cramping. 30 tablet 6  . ondansetron (ZOFRAN) 4 MG tablet TAKE 1 TABLET BY MOUTH EVERY 8 HOURS AS NEEDED FOR NAUSEA AND VOMITING (Patient taking differently: Take 4 mg by mouth every 8 (eight) hours as needed for nausea or vomiting. ) 30 tablet 11  . tiZANidine (ZANAFLEX) 4 MG capsule Take 1 capsule (4 mg total) by mouth 3 (three) times daily. (Patient taking differently: Take 4 mg by mouth 3 (three) times daily as needed for muscle spasms. ) 90 capsule 3  . Vitamin D, Ergocalciferol, (DRISDOL) 1.25 MG (50000 UT) CAPS capsule Take 1 capsule (50,000 Units total) by mouth every 7 (seven) days. 5 capsule 6  . voxelotor (OXBRYTA) 500 MG TABS tablet Take 1,500 mg by mouth daily. For Sickle Cell Anemia    . oxyCODONE ER (XTAMPZA ER) 18 MG C12A Take 18 mg by mouth every 12 (twelve) hours. 60 capsule 0  . Oxycodone HCl 20 MG TABS Take 1 tablet (20 mg total) by mouth every 4 (four) hours as needed (for pain). 90 tablet 0  . DULoxetine (CYMBALTA) 20 MG capsule Take 20 mg by mouth daily. (Patient not taking: Reported on 08/11/2019)    . hydroxyurea (HYDREA) 500 MG capsule Take 2,000 mg by mouth daily. May take with food to minimize GI side effects. (Patient not taking: Reported on 08/11/2019)    . naloxone (NARCAN) 0.4 MG/ML injection As needed (Patient not taking: Reported  on 08/22/2019) 1 mL 0   No facility-administered medications prior to visit.    No Known Allergies  ROS Review of Systems  Constitutional: Negative.   HENT: Negative.   Eyes: Negative.   Respiratory: Negative.   Cardiovascular: Negative.   Gastrointestinal: Positive for abdominal distention.  Endocrine: Negative.   Genitourinary: Negative.   Musculoskeletal: Positive for arthralgias (generalized joint pain .).  Skin: Negative.   Allergic/Immunologic: Negative.   Neurological: Positive for dizziness (occasional ) and headaches (occasional ).  Hematological: Negative.   Psychiatric/Behavioral: Negative.       Objective:    Physical Exam Vitals  and nursing note reviewed.  Constitutional:      Appearance: Normal appearance.  HENT:     Head: Normocephalic and atraumatic.     Nose: Nose normal.     Mouth/Throat:     Mouth: Mucous membranes are moist.     Pharynx: Oropharynx is clear.  Cardiovascular:     Rate and Rhythm: Normal rate and regular rhythm.     Pulses: Normal pulses.     Heart sounds: Normal heart sounds.  Pulmonary:     Effort: Pulmonary effort is normal.     Breath sounds: Normal breath sounds.  Abdominal:     General: Bowel sounds are normal. There is distension (occasional ).     Palpations: Abdomen is soft.  Musculoskeletal:     Cervical back: Normal range of motion and neck supple.  Neurological:     Mental Status: She is alert.     BP 124/84 (BP Location: Right Wrist, Patient Position: Sitting, Cuff Size: Normal)   Pulse 93   Temp 98.2 F (36.8 C)   Resp 18   Ht 5\' 5"  (1.651 m)   LMP 07/31/2019 (Exact Date)   SpO2 100%   BMI 48.76 kg/m  Wt Readings from Last 3 Encounters:  08/10/19 293 lb (132.9 kg)  07/22/19 293 lb (132.9 kg)  06/26/19 265 lb (120.2 kg)     Health Maintenance Due  Topic Date Due  . Hepatitis C Screening  Never done  . COVID-19 Vaccine (1) Never done  . TETANUS/TDAP  Never done  . PAP SMEAR-Modifier  Never done      There are no preventive care reminders to display for this patient.  Lab Results  Component Value Date   TSH 1.120 11/15/2018   Lab Results  Component Value Date   WBC 14.2 (H) 08/13/2019   HGB 9.8 (L) 08/13/2019   HCT 28.2 (L) 08/13/2019   MCV 71.0 (L) 08/13/2019   PLT 171 08/13/2019   Lab Results  Component Value Date   NA 138 08/11/2019   K 4.5 08/11/2019   CO2 26 08/11/2019   GLUCOSE 108 (H) 08/11/2019   BUN 12 08/11/2019   CREATININE 0.60 08/11/2019   BILITOT 1.2 08/10/2019   ALKPHOS 76 08/10/2019   AST 23 08/10/2019   ALT 36 08/10/2019   PROT 7.3 08/10/2019   ALBUMIN 3.8 08/10/2019   CALCIUM 8.8 (L) 08/11/2019   ANIONGAP 9 08/11/2019   Lab Results  Component Value Date   CHOL 84 (L) 11/15/2018   Lab Results  Component Value Date   HDL 33 (L) 11/15/2018   Lab Results  Component Value Date   LDLCALC 34 11/15/2018   Lab Results  Component Value Date   TRIG 84 11/15/2018   Lab Results  Component Value Date   CHOLHDL 2.5 11/15/2018   Lab Results  Component Value Date   HGBA1C 4.1 09/11/2017      Assessment & Plan:   1. Hospital discharge follow-up  2. Hb-SS disease without crisis [facility] [place] [facility]) She is doing well today r/t her chronic pain management. She will continue to take pain medications as prescribed; will continue to avoid extreme heat and cold; will continue to eat a healthy diet and drink at least 64 ounces of water daily; continue stool softener as needed; will avoid colds and flu; will continue to get plenty of sleep and rest; will continue to avoid high stressful situations and remain infection free; will continue Folic Acid 1 mg daily to avoid sickle cell  crisis. Continue to follow up with Hematologist as needed.   3. Chronic, continuous use of opioids  4. Chronic pain syndrome  5. Anxiety Stable.   6. Follow up She will follow up in 2 months.   No orders of the defined types were placed in this encounter.   No orders of the  defined types were placed in this encounter.   Referral Orders  No referral(s) requested today   [name],  MSN, FNP-BC [facility]/Internal Medicine/Sickle Cell Center [facility] [address] [place], [place] [address] [phone]- fax   Problem List Items Addressed This Visit      Other   Anxiety   Chronic pain syndrome   Chronic, continuous use of opioids   Hb-SS disease without crisis [facility])    Other Visit Diagnoses    Hospital discharge follow-up    -  Primary   Follow up          No orders of the defined types were placed in this encounter.   Follow-up: Return in about 2 months (around 10/23/2019).    Kallie Locks, FNP

## 2019-08-24 ENCOUNTER — Other Ambulatory Visit: Payer: Self-pay | Admitting: Family Medicine

## 2019-08-24 ENCOUNTER — Telehealth: Payer: Self-pay | Admitting: Family Medicine

## 2019-08-24 DIAGNOSIS — F1190 Opioid use, unspecified, uncomplicated: Secondary | ICD-10-CM

## 2019-08-24 DIAGNOSIS — D571 Sickle-cell disease without crisis: Secondary | ICD-10-CM

## 2019-08-24 DIAGNOSIS — G894 Chronic pain syndrome: Secondary | ICD-10-CM

## 2019-08-24 MED ORDER — XTAMPZA ER 18 MG PO C12A: 18.0000 mg | EXTENDED_RELEASE_CAPSULE | Freq: Two times a day (BID) | ORAL | 0 refills | Status: DC

## 2019-08-24 MED ORDER — OXYCODONE HCL 20 MG PO TABS: 20.0000 mg | ORAL_TABLET | ORAL | 0 refills | Status: DC | PRN

## 2019-08-24 NOTE — Telephone Encounter (Signed)
Done

## 2019-09-07 ENCOUNTER — Other Ambulatory Visit: Payer: Self-pay | Admitting: Family Medicine

## 2019-09-07 ENCOUNTER — Telehealth: Payer: Self-pay | Admitting: Family Medicine

## 2019-09-07 DIAGNOSIS — G894 Chronic pain syndrome: Secondary | ICD-10-CM

## 2019-09-07 DIAGNOSIS — F1190 Opioid use, unspecified, uncomplicated: Secondary | ICD-10-CM

## 2019-09-07 DIAGNOSIS — D571 Sickle-cell disease without crisis: Secondary | ICD-10-CM

## 2019-09-07 MED ORDER — OXYCODONE HCL 20 MG PO TABS: 20.0000 mg | ORAL_TABLET | ORAL | 0 refills | Status: DC | PRN

## 2019-09-07 NOTE — Telephone Encounter (Signed)
Done

## 2019-09-19 ENCOUNTER — Other Ambulatory Visit: Payer: Self-pay | Admitting: Family Medicine

## 2019-09-19 ENCOUNTER — Telehealth: Payer: Self-pay | Admitting: Family Medicine

## 2019-09-19 DIAGNOSIS — G894 Chronic pain syndrome: Secondary | ICD-10-CM

## 2019-09-19 DIAGNOSIS — D571 Sickle-cell disease without crisis: Secondary | ICD-10-CM

## 2019-09-19 DIAGNOSIS — F1190 Opioid use, unspecified, uncomplicated: Secondary | ICD-10-CM

## 2019-09-19 MED ORDER — OXYCODONE HCL 20 MG PO TABS: 20.0000 mg | ORAL_TABLET | ORAL | 0 refills | Status: DC | PRN

## 2019-09-19 MED ORDER — XTAMPZA ER 18 MG PO C12A: 18.0000 mg | EXTENDED_RELEASE_CAPSULE | Freq: Two times a day (BID) | ORAL | 0 refills | Status: DC

## 2019-09-19 NOTE — Telephone Encounter (Signed)
Would like to know when oxycodone can be refilled. States the pharm is requesting a hard copy for refill

## 2019-09-20 ENCOUNTER — Telehealth (INDEPENDENT_AMBULATORY_CARE_PROVIDER_SITE_OTHER): Payer: Medicaid Other | Admitting: Family Medicine

## 2019-09-20 ENCOUNTER — Telehealth (HOSPITAL_COMMUNITY): Payer: Self-pay | Admitting: *Deleted

## 2019-09-20 ENCOUNTER — Non-Acute Institutional Stay (HOSPITAL_COMMUNITY)
Admission: AD | Admit: 2019-09-20 | Discharge: 2019-09-20 | Disposition: A | Discharge status: Home / Self Care | Payer: Medicaid Other | Source: Ambulatory Visit | Attending: Internal Medicine | Admitting: Internal Medicine

## 2019-09-20 ENCOUNTER — Encounter (HOSPITAL_COMMUNITY): Payer: Self-pay | Admitting: Internal Medicine

## 2019-09-20 ENCOUNTER — Other Ambulatory Visit: Payer: Self-pay | Admitting: Family Medicine

## 2019-09-20 ENCOUNTER — Other Ambulatory Visit: Payer: Self-pay | Admitting: Internal Medicine

## 2019-09-20 DIAGNOSIS — G894 Chronic pain syndrome: Secondary | ICD-10-CM

## 2019-09-20 DIAGNOSIS — D571 Sickle-cell disease without crisis: Secondary | ICD-10-CM

## 2019-09-20 DIAGNOSIS — M79604 Pain in right leg: Secondary | ICD-10-CM | POA: Insufficient documentation

## 2019-09-20 DIAGNOSIS — F329 Major depressive disorder, single episode, unspecified: Secondary | ICD-10-CM | POA: Diagnosis not present

## 2019-09-20 DIAGNOSIS — Z09 Encounter for follow-up examination after completed treatment for conditions other than malignant neoplasm: Secondary | ICD-10-CM

## 2019-09-20 DIAGNOSIS — F1190 Opioid use, unspecified, uncomplicated: Secondary | ICD-10-CM

## 2019-09-20 DIAGNOSIS — E559 Vitamin D deficiency, unspecified: Secondary | ICD-10-CM | POA: Diagnosis not present

## 2019-09-20 DIAGNOSIS — D5700 Hb-SS disease with crisis, unspecified: Secondary | ICD-10-CM | POA: Insufficient documentation

## 2019-09-20 DIAGNOSIS — F419 Anxiety disorder, unspecified: Secondary | ICD-10-CM

## 2019-09-20 DIAGNOSIS — Z79899 Other long term (current) drug therapy: Secondary | ICD-10-CM | POA: Diagnosis not present

## 2019-09-20 DIAGNOSIS — F1120 Opioid dependence, uncomplicated: Secondary | ICD-10-CM | POA: Diagnosis not present

## 2019-09-20 DIAGNOSIS — M79605 Pain in left leg: Secondary | ICD-10-CM | POA: Insufficient documentation

## 2019-09-20 DIAGNOSIS — M62838 Other muscle spasm: Secondary | ICD-10-CM

## 2019-09-20 LAB — RETICULOCYTES
Immature Retic Fract: 19.2 % — ABNORMAL HIGH (ref 2.3–15.9)
RBC.: 5.78 MIL/uL — ABNORMAL HIGH (ref 3.87–5.11)
Retic Count, Absolute: 109.2 10*3/uL (ref 19.0–186.0)
Retic Ct Pct: 1.9 % (ref 0.4–3.1)

## 2019-09-20 LAB — COMPREHENSIVE METABOLIC PANEL
ALT: 15 U/L (ref 0–44)
AST: 13 U/L — ABNORMAL LOW (ref 15–41)
Albumin: 4.2 g/dL (ref 3.5–5.0)
Alkaline Phosphatase: 68 U/L (ref 38–126)
Anion gap: 12 (ref 5–15)
BUN: 7 mg/dL (ref 6–20)
CO2: 21 mmol/L — ABNORMAL LOW (ref 22–32)
Calcium: 9.5 mg/dL (ref 8.9–10.3)
Chloride: 108 mmol/L (ref 98–111)
Creatinine, Ser: 0.76 mg/dL (ref 0.44–1.00)
GFR calc Af Amer: >60 mL/min (ref >60)
GFR calc non Af Amer: >60 mL/min (ref >60)
Glucose, Bld: 103 mg/dL — ABNORMAL HIGH (ref 70–99)
Potassium: 3.9 mmol/L (ref 3.5–5.1)
Sodium: 141 mmol/L (ref 135–145)
Total Bilirubin: 0.7 mg/dL (ref 0.3–1.2)
Total Protein: 7.6 g/dL (ref 6.5–8.1)

## 2019-09-20 LAB — LACTATE DEHYDROGENASE: LDH: 134 U/L (ref 98–192)

## 2019-09-20 LAB — CBC WITH DIFFERENTIAL/PLATELET
Abs Immature Granulocytes: 0.02 10*3/uL (ref 0.00–0.07)
Basophils Absolute: 0 10*3/uL (ref 0.0–0.1)
Basophils Relative: 0 %
Eosinophils Absolute: 0 10*3/uL (ref 0.0–0.5)
Eosinophils Relative: 1 %
HCT: 40.7 % (ref 36.0–46.0)
Hemoglobin: 14.4 g/dL (ref 12.0–15.0)
Immature Granulocytes: 0 %
Lymphocytes Relative: 26 %
Lymphs Abs: 1.9 10*3/uL (ref 0.7–4.0)
MCH: 25 pg — ABNORMAL LOW (ref 26.0–34.0)
MCHC: 35.4 g/dL (ref 30.0–36.0)
MCV: 70.7 fL — ABNORMAL LOW (ref 80.0–100.0)
Monocytes Absolute: 0.5 10*3/uL (ref 0.1–1.0)
Monocytes Relative: 7 %
Neutro Abs: 4.9 10*3/uL (ref 1.7–7.7)
Neutrophils Relative %: 66 %
Platelets: 301 10*3/uL (ref 150–400)
RBC: 5.76 MIL/uL — ABNORMAL HIGH (ref 3.87–5.11)
RDW: 14.6 % (ref 11.5–15.5)
WBC: 7.4 10*3/uL (ref 4.0–10.5)
nRBC: 0 % (ref 0.0–0.2)

## 2019-09-20 LAB — PREGNANCY, URINE: Preg Test, Ur: NEGATIVE

## 2019-09-20 MED ORDER — HYDROMORPHONE 1 MG/ML IV SOLN
INTRAVENOUS | Status: DC
  Administered 2019-09-20: 15.2 mg via INTRAVENOUS
  Administered 2019-09-20: 30 mg via INTRAVENOUS
  Filled 2019-09-20: qty 30

## 2019-09-20 MED ORDER — SODIUM CHLORIDE 0.9% FLUSH: 9.0000 mL | INTRAVENOUS | Status: DC | PRN

## 2019-09-20 MED ORDER — POLYETHYLENE GLYCOL 3350 17 G PO PACK: 17.0000 g | PACK | Freq: Every day | ORAL | Status: DC | PRN

## 2019-09-20 MED ORDER — ONDANSETRON HCL 4 MG/2ML IJ SOLN: 4.0000 mg | Freq: Four times a day (QID) | INTRAMUSCULAR | Status: DC | PRN

## 2019-09-20 MED ORDER — SODIUM CHLORIDE 0.45 % IV SOLN: INTRAVENOUS | Status: DC

## 2019-09-20 MED ORDER — DIPHENHYDRAMINE HCL 25 MG PO CAPS: 25.0000 mg | ORAL_CAPSULE | ORAL | Status: DC | PRN

## 2019-09-20 MED ORDER — SODIUM CHLORIDE 0.9 % IV SOLN
25.0000 mg | INTRAVENOUS | Status: DC | PRN
  Filled 2019-09-20: qty 0.5

## 2019-09-20 MED ORDER — SENNOSIDES-DOCUSATE SODIUM 8.6-50 MG PO TABS: 1.0000 | ORAL_TABLET | Freq: Two times a day (BID) | ORAL | Status: DC

## 2019-09-20 MED ORDER — NALOXONE HCL 0.4 MG/ML IJ SOLN: 0.4000 mg | INTRAMUSCULAR | Status: DC | PRN

## 2019-09-20 MED ORDER — KETOROLAC TROMETHAMINE 15 MG/ML IJ SOLN: 15.0000 mg | Freq: Four times a day (QID) | INTRAMUSCULAR | Status: DC

## 2019-09-20 NOTE — Telephone Encounter (Signed)
Patient called requesting to come to the day hospital for sickle cell pain. Patient reports bilateral leg pain rated 7/10. Reports taking Oxycodone at 2:00 am but is now out of mediation and can not get it filled until Friday. COVID-19 screening done and patient denies all symptoms and exposures. Denies fever, chest pain, nausea, vomiting, diarrhea and abdominal pain. Admits to having transportation at discharge without driving self. Dr. Hyman Hopes notified. Patient can come to the day hospital for pain management. Patient advised and expresses an understanding.

## 2019-09-20 NOTE — Progress Notes (Signed)
Virtual Visit via Telephone Note  I connected with Stephanie Burch on 09/20/19 at  by telephone and verified that I am speaking with the correct person using two identifiers.   I discussed the limitations, risks, security and privacy concerns of performing an evaluation and management service by telephone and the availability of in person appointments. I also discussed with the patient that there may be a patient responsible charge related to this service. The patient expressed understanding and agreed to proceed.  Televisit Today Patient Location: Home Provider Location: Office   History of Present Illness:  Social History   Socioeconomic History  . Marital status: Single    Spouse name: Not on file  . Number of children: Not on file  . Years of education: Not on file  . Highest education level: Not on file  Occupational History  . Not on file  Tobacco Use  . Smoking status: Never Smoker  . Smokeless tobacco: Never Used  Vaping Use  . Vaping Use: Never used  Substance and Sexual Activity  . Alcohol use: Not Currently  . Drug use: Never  . Sexual activity: Yes    Birth control/protection: None  Other Topics Concern  . Not on file  Social History Narrative  . Not on file   Social Determinants of Health   Financial Resource Strain:   . Difficulty of Paying Living Expenses:   Food Insecurity:   . Worried About Programme researcher, broadcasting/film/video in the Last Year:   . Barista in the Last Year:   Transportation Needs:   . Freight forwarder (Medical):   Marland Kitchen Lack of Transportation (Non-Medical):   Physical Activity:   . Days of Exercise per Week:   . Minutes of Exercise per Session:   Stress:   . Feeling of Stress :   Social Connections:   . Frequency of Communication with Friends and Family:   . Frequency of Social Gatherings with Friends and Family:   . Attends Religious Services:   . Active Member of Clubs or Organizations:   . Attends Banker Meetings:    Marland Kitchen Marital Status:   Intimate Partner Violence:   . Fear of Current or Ex-Partner:   . Emotionally Abused:   Marland Kitchen Physically Abused:   . Sexually Abused:     No past surgical history on file.   Family History  Problem Relation Age of Onset  . Hypertension Mother   . Sickle cell trait Mother   . Diabetes Father   . Sickle cell trait Father    No Known Allergies   Current Outpatient Medications on File Prior to Visit  Medication Sig Dispense Refill  . DULoxetine (CYMBALTA) 20 MG capsule Take 20 mg by mouth daily. (Patient not taking: Reported on 08/11/2019)    . folic acid (FOLVITE) 1 MG tablet Take 1 tablet (1 mg total) by mouth daily. 30 tablet 11  . hydroxyurea (HYDREA) 500 MG capsule Take 2,000 mg by mouth daily. May take with food to minimize GI side effects. (Patient not taking: Reported on 08/11/2019)    . ibuprofen (ADVIL) 800 MG tablet Take 1 tablet (800 mg total) by mouth every 8 (eight) hours as needed for headache, mild pain, moderate pain or cramping. 30 tablet 6  . naloxone (NARCAN) 0.4 MG/ML injection As needed (Patient not taking: Reported on 08/22/2019) 1 mL 0  . ondansetron (ZOFRAN) 4 MG tablet TAKE 1 TABLET BY MOUTH EVERY 8 HOURS AS NEEDED FOR NAUSEA  AND VOMITING (Patient taking differently: Take 4 mg by mouth every 8 (eight) hours as needed for nausea or vomiting. ) 30 tablet 11  . [START ON 09/29/2019] oxyCODONE ER (XTAMPZA ER) 18 MG C12A Take 18 mg by mouth every 12 (twelve) hours. 60 capsule 0  . [START ON 09/23/2019] Oxycodone HCl 20 MG TABS Take 1 tablet (20 mg total) by mouth every 4 (four) hours as needed (for pain). 90 tablet 0  . tiZANidine (ZANAFLEX) 4 MG capsule Take 1 capsule (4 mg total) by mouth 3 (three) times daily. (Patient taking differently: Take 4 mg by mouth 3 (three) times daily as needed for muscle spasms. ) 90 capsule 3  . Vitamin D, Ergocalciferol, (DRISDOL) 1.25 MG (50000 UT) CAPS capsule Take 1 capsule (50,000 Units total) by mouth every 7 (seven)  days. 5 capsule 6  . voxelotor (OXBRYTA) 500 MG TABS tablet Take 1,500 mg by mouth daily. For Sickle Cell Anemia     No current facility-administered medications on file prior to visit.    Patient Active Problem List   Diagnosis Date Noted  . Urinary frequency 04/20/2019  . Vaginal odor 04/20/2019  . Chest pain varying with breathing   . Fever and chills   . Anxiety 11/15/2018  . Vitamin D deficiency 11/15/2018  . Nausea 11/15/2018  . Abnormal pulse oximetry   . Hypokalemia 09/20/2018  . Medication management 09/14/2018  . Muscle spasms of both lower extremities 08/25/2018  . Subjective visual disturbance of right eye 08/25/2018  . 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 Chronic pain syndrome 03/16/2018  . Chronic, continuous use of opioids 03/16/2018  . Tachycardia with heart rate 100-120 beats per minute   . Sickle cell anemia with crisis (HCC) 10/19/2017  . Leukocytosis 10/19/2017  . Thrombocytopenia (HCC) 10/19/2017  . Morbidly obese (HCC) 10/19/2017  . Sickle cell pain crisis (HCC) 10/15/2017   Current Status: Since her last office visit, she is doing well with no complaints. She states that she has pain in her arms and legs. She rates her pain today at 8/10. She states that she is out of her long and short-acting pain medications, as she has been taking medication more often. She states that she is taking Motrin as needed, but does not take Hydrea because she believes that it is not effective, and Tizanidine because it makes her drowsy. She has not had a hospital visit for Sickle Cell Crisis since 08/10/2019 where she was treated and discharged on 08/14/2019. She is currently taking all medications as prescribed and staying well hydrated. She reports occasional nausea, constipation, dizziness and headaches. She denies fevers, chills, fatigue, recent infections, weight loss, and night sweats. She has not had any visual changes, and falls. No chest pain, heart palpitations,  cough and shortness of breath reported. Denies GI problems such as vomiting, and diarrhea. She has no reports of blood in stools, dysuria and hematuria. Her anxiety is increased today and she is requesting [facility].   Observations/Objective:  Telephone Visit   Assessment and Plan:  1. Hb-SS disease without crisis [facility]) She will report to [facility] today for possible IVFs, IV pain medications and antiemetics, in attempt to decrease her incident of pain crisis. She is doing well today r/t her chronic pain management. She will continue to take pain medications as prescribed; will continue to avoid extreme heat and cold; will continue to eat a healthy diet and drink at least 64 ounces  of water daily; continue stool softener as needed; will avoid colds and flu; will continue to get plenty of sleep and rest; will continue to avoid high stressful situations and remain infection free; will continue Folic Acid 1 mg daily to avoid sickle cell crisis. Continue to follow up with Hematologist as needed.   2. Chronic, continuous use of opioids  3. Chronic pain syndrome  4. Anxiety Increased today.   5. Follow-up She will schedule follow up after her discharge from [facility].  No orders of the defined types were placed in this encounter.   No orders of the defined types were placed in this encounter.   Referral Orders  No referral(s) requested today    [name],  MSN, FNP-BC [facility] [address] [place], [place] [address] [phone] fax  I discussed the assessment and treatment plan with the patient. The patient was provided an opportunity to ask questions and all were answered. The patient agreed with the plan and demonstrated an understanding of the instructions.   The patient was advised to call back or seek an in-person evaluation if  the symptoms worsen or if the condition fails to improve as anticipated.  I provided 15 minutes of non-face-to-face time during this encounter.   Kallie Locks, FNP

## 2019-09-20 NOTE — Discharge Instructions (Signed)
Sickle Cell Anemia, Adult  Sickle cell anemia is a condition where your red blood cells are shaped like sickles. Red blood cells carry oxygen through the body. Sickle-shaped cells do not live as long as normal red blood cells. They also clump together and block blood from flowing through the blood vessels. This prevents the body from getting enough oxygen. Sickle cell anemia causes organ damage and pain. It also increases the risk of infection. Follow these instructions at home: Medicines  Take over-the-counter and prescription medicines only as told by your doctor.  If you were prescribed an antibiotic medicine, take it as told by your doctor. Do not stop taking the antibiotic even if you start to feel better.  If you develop a fever, do not take medicines to lower the fever right away. Tell your doctor about the fever. Managing pain, stiffness, and swelling  Try these methods to help with pain: ? Use a heating pad. ? Take a warm bath. ? Distract yourself, such as by watching TV. Eating and drinking  Drink enough fluid to keep your pee (urine) clear or pale yellow. Drink more in hot weather and during exercise.  Limit or avoid alcohol.  Eat a healthy diet. Eat plenty of fruits, vegetables, whole grains, and lean protein.  Take vitamins and supplements as told by your doctor. Traveling  When traveling, keep these with you: ? Your medical information. ? The names of your doctors. ? Your medicines.  If you need to take an airplane, talk to your doctor first. Activity  Rest often.  Avoid exercises that make your heart beat much faster, such as jogging. General instructions  Do not use products that have nicotine or tobacco, such as cigarettes and e-cigarettes. If you need help quitting, ask your doctor.  Consider wearing a medical alert bracelet.  Avoid being in high places (high altitudes), such as mountains.  Avoid very hot or cold temperatures.  Avoid places where the  temperature changes a lot.  Keep all follow-up visits as told by your doctor. This is important. Contact a doctor if:  A joint hurts.  Your feet or hands hurt or swell.  You feel tired (fatigued). Get help right away if:  You have symptoms of infection. These include: ? Fever. ? Chills. ? Being very tired. ? Irritability. ? Poor eating. ? Throwing up (vomiting).  You feel dizzy or faint.  You have new stomach pain, especially on the left side.  You have a an erection (priapism) that lasts more than 4 hours.  You have numbness in your arms or legs.  You have a hard time moving your arms or legs.  You have trouble talking.  You have pain that does not go away when you take medicine.  You are short of breath.  You are breathing fast.  You have a long-term cough.  You have pain in your chest.  You have a bad headache.  You have a stiff neck.  Your stomach looks bloated even though you did not eat much.  Your skin is pale.  You suddenly cannot see well. Summary  Sickle cell anemia is a condition where your red blood cells are shaped like sickles.  Follow your doctor's advice on ways to manage pain, food to eat, activities to do, and steps to take for safe travel.  Get medical help right away if you have any signs of infection, such as a fever. This information is not intended to replace advice given to you by   your health care provider. Make sure you discuss any questions you have with your health care provider. Document Revised: 05/14/2018 Document Reviewed: 02/26/2016 Elsevier Patient Education  2020 [employer].  

## 2019-09-20 NOTE — H&P (Signed)
[facility] History and Physical  Amanpreet Delmont NWG:[id number] DOB: 1980-04-15 DOA: 09/20/2019  PCP: Kallie Locks, FNP   Chief Complaint: Sickle cell pain  HPI: Stephanie Burch is a 39 y.o. female with history of sickle cell disease, chronic pain syndrome, opiate dependence and tolerance, history of anemia of chronic disease and major depression anxiety who presented to the day hospital this morning with major complaints of generalized body pain, most especially in her lower extremities that is consistent with her sickle cell pain crisis. Patient claims she ran out of her home pain medications and had nothing to help relieve the pain. Pain intensity is 10/10.  She characterized her pain as constant, throbbing and achy.  She does not have any joint swelling or redness.  She denies any fever, chills, chest pain, cough, shortness of breath, urinary symptoms, nausea, vomiting or diarrhea.  She denies any sick contact, recent travel exposure to COVID-19.  Her last hospital admission was 08/10/2019.  Systemic Review: General: The patient denies anorexia, fever, weight loss Cardiac: Denies chest pain, syncope, palpitations, pedal edema  Respiratory: Denies cough, shortness of breath, wheezing GI: Denies severe indigestion/heartburn, abdominal pain, nausea, vomiting, diarrhea and constipation GU: Denies hematuria, incontinence, dysuria  Musculoskeletal: Denies arthritis  Skin: Denies suspicious skin lesions Neurologic: Denies focal weakness or numbness, change in vision  Past Medical History:  Diagnosis Date  . Nausea   . Sickle cell anemia (HCC)   . Vitamin B12 deficiency 12/2018  . Vitamin D deficiency     No past surgical history on file.  No Known Allergies  Family History  Problem Relation Age of Onset  . Hypertension Mother   . Sickle cell trait Mother   . Diabetes Father   . Sickle cell trait Father       Prior to Admission medications   Medication Sig Start  Date End Date Taking? Authorizing Provider  DULoxetine (CYMBALTA) 20 MG capsule Take 20 mg by mouth daily. Patient not taking: Reported on 08/11/2019    [provider]  folic acid (FOLVITE) 1 MG tablet Take 1 tablet (1 mg total) by mouth daily. 11/15/18   Kallie Locks, FNP  hydroxyurea (HYDREA) 500 MG capsule Take 2,000 mg by mouth daily. May take with food to minimize GI side effects. Patient not taking: Reported on 08/11/2019    [provider]  ibuprofen (ADVIL) 800 MG tablet Take 1 tablet (800 mg total) by mouth every 8 (eight) hours as needed for headache, mild pain, moderate pain or cramping. 08/15/19   Kallie Locks, FNP  naloxone [facility]) 0.4 MG/ML injection As needed Patient not taking: Reported on 08/22/2019 09/14/18   Kallie Locks, FNP  ondansetron (ZOFRAN) 4 MG tablet TAKE 1 TABLET BY MOUTH EVERY 8 HOURS AS NEEDED FOR NAUSEA AND VOMITING Patient taking differently: Take 4 mg by mouth every 8 (eight) hours as needed for nausea or vomiting.  07/14/19   Kallie Locks, FNP  oxyCODONE ER (XTAMPZA ER) 18 MG C12A Take 18 mg by mouth every 12 (twelve) hours. 09/29/19   Kallie Locks, FNP  Oxycodone HCl 20 MG TABS Take 1 tablet (20 mg total) by mouth every 4 (four) hours as needed (for pain). 09/23/19   Kallie Locks, FNP  tiZANidine (ZANAFLEX) 4 MG capsule Take 1 capsule (4 mg total) by mouth 3 (three) times daily. Patient taking differently: Take 4 mg by mouth 3 (three) times daily as needed for muscle spasms.  06/26/19 10/24/19  Kallie Locks, FNP  Vitamin D, Ergocalciferol, (DRISDOL) 1.25 MG (50000 UT) CAPS capsule Take 1 capsule (50,000 Units total) by mouth every 7 (seven) days. 12/21/18   Kallie Locks, FNP  voxelotor (OXBRYTA) 500 MG TABS tablet Take 1,500 mg by mouth daily. For Sickle Cell Anemia    [provider]     Physical Exam: There were no vitals filed for this visit.  General: Alert, awake, afebrile, anicteric, not in  obvious distress, obese HEENT: Normocephalic and Atraumatic, Mucous membranes pink                PERRLA; EOM intact; No scleral icterus,                 Nares: Patent, Oropharynx: Clear, Fair Dentition                 Neck: FROM, no cervical lymphadenopathy, thyromegaly, carotid bruit or JVD;  CHEST WALL: No tenderness  CHEST: Normal respiration, clear to auscultation bilaterally  HEART: Regular rate and rhythm; no murmurs rubs or gallops  BACK: No kyphosis or scoliosis; no CVA tenderness  ABDOMEN: Positive Bowel Sounds, soft, non-tender; no masses, no organomegaly EXTREMITIES: No cyanosis, clubbing, or edema SKIN:  no rash or ulceration  CNS: Alert and Oriented x 4, Nonfocal exam, CN 2-12 intact  Labs on Admission:  Basic Metabolic Panel: No results for input(s): NA, K, CL, CO2, GLUCOSE, BUN, CREATININE, CALCIUM, MG, PHOS in the last 168 hours. Liver Function Tests: No results for input(s): AST, ALT, ALKPHOS, BILITOT, PROT, ALBUMIN in the last 168 hours. No results for input(s): LIPASE, AMYLASE in the last 168 hours. No results for input(s): AMMONIA in the last 168 hours. CBC: No results for input(s): WBC, NEUTROABS, HGB, HCT, MCV, PLT in the last 168 hours. Cardiac Enzymes: No results for input(s): CKTOTAL, CKMB, CKMBINDEX, TROPONINI in the last 168 hours.  BNP (last 3 results) No results for input(s): BNP in the last 8760 hours.  ProBNP (last 3 results) No results for input(s): PROBNP in the last 8760 hours.  CBG: No results for input(s): GLUCAP in the last 168 hours.   Assessment/Plan Active Problems:   Sickle cell anemia with crisis (HCC)   Admits to the Day Hospital  IVF 0.45% Saline @ 150 mls/hour  Weight based Dilaudid PCA started within 30 minutes of admission @ 0.6/10/4  IV Toradol 15 mg Q 6 H x 2 doses  Monitor vitals very closely, Re-evaluate pain scale every hour  2 L of Oxygen by [place]  Patient will be re-evaluated for pain in the context of function  and relationship to baseline as care progresses.  If no significant relieve from pain (remains above 5/10) will transfer patient to inpatient services for further evaluation and management  Code Status: Full  Family Communication: None  DVT Prophylaxis: Ambulate as tolerated   Time spent: 35 Minutes  Jeanann Lewandowsky, MD, MHA, FACP, FAAP, CPE  If 7PM-7AM, please contact night-coverage [email or web address] 09/20/2019, 9:08 AM

## 2019-09-20 NOTE — Progress Notes (Signed)
Patient admitted to the day hospital for treatment of sickle cell pain crisis. Patient reported pain rated 7/10 in the right leg . Patient placed on Dilaudid PCA and hydrated with IV fluids. At discharge patient reported  pain at 3/10. Patient declined printed AVS.  Patient alert, oriented and ambulatory at discharge.

## 2019-09-20 NOTE — Discharge Summary (Signed)
Physician Discharge Summary  Stephanie Burch RFF:[id number] DOB: 18-Mar-1980 DOA: 09/20/2019  PCP: Kallie Locks, FNP  Admit date: 09/20/2019  Discharge date: 09/20/2019  Time spent: 30 minutes  Discharge Diagnoses:  Active Problems:   Sickle cell anemia with crisis [facility])   Discharge Condition: Stable  Diet recommendation: Regular  History of present illness:  Stephanie Burch is a 39 y.o. female with history of sickle cell disease, chronic pain syndrome, opiate dependence and tolerance, history of anemia of chronic disease and major depression anxiety who presented to the day hospital this morning with major complaints of generalized body pain, most especially in her lower extremities that is consistent with her sickle cell pain crisis. Patient claims she ran out of her home pain medications and had nothing to help relieve the pain. Pain intensity is 10/10.  She characterized her pain as constant, throbbing and achy.  She does not have any joint swelling or redness.  She denies any fever, chills, chest pain, cough, shortness of breath, urinary symptoms, nausea, vomiting or diarrhea.  She denies any sick contact, recent travel exposure to COVID-19.  Her last hospital admission was 08/10/2019.  Hospital Course:  Stephanie Burch was admitted to the day hospital with sickle cell painful crisis. Patient was treated with weight based IV Dilaudid PCA, IV Toradol, clinician assisted doses as deemed appropriate and IV fluids. Stephanie Burch showed significant improvement symptomatically, pain improved from 10 to 3/10 at the time of discharge. Patient was discharged home in a hemodynamically stable condition. Stephanie Burch will follow-up at the clinic as previously scheduled, continue with home medications as per prior to admission.  Discharge Instructions We discussed the need for good hydration, monitoring of hydration status, avoidance of heat, cold, stress, and infection triggers. We discussed the need to be  compliant with taking Hydrea and other home medications. Stephanie Burch was reminded of the need to seek medical attention immediately if any symptom of bleeding, anemia, or infection occurs.  Discharge Exam: Vitals:   09/20/19 1137 09/20/19 1413  BP: 127/80 (!) 114/56  Pulse:  95  Resp: 18 12  Temp:    SpO2: 97% 95%    General appearance: alert, cooperative and no distress Eyes: conjunctivae/corneas clear. PERRL, EOM's intact. Fundi benign. Neck: no adenopathy, no carotid bruit, no JVD, supple, symmetrical, trachea midline and thyroid not enlarged, symmetric, no tenderness/mass/nodules Back: symmetric, no curvature. ROM normal. No CVA tenderness. Resp: clear to auscultation bilaterally Chest wall: no tenderness Cardio: regular rate and rhythm, S1, S2 normal, no murmur, click, rub or gallop GI: soft, non-tender; bowel sounds normal; no masses,  no organomegaly Extremities: extremities normal, atraumatic, no cyanosis or edema Pulses: 2+ and symmetric Skin: Skin color, texture, turgor normal. No rashes or lesions Neurologic: Grossly normal  Discharge Instructions    Diet - low sodium heart healthy   Complete by: As directed    Increase activity slowly   Complete by: As directed      Allergies as of 09/20/2019   No Known Allergies     Medication List    TAKE these medications   DULoxetine 20 MG capsule Commonly known as: CYMBALTA Take 20 mg by mouth daily.   folic acid 1 MG tablet Commonly known as: FOLVITE Take 1 tablet (1 mg total) by mouth daily.   hydroxyurea 500 MG capsule Commonly known as: HYDREA Take 2,000 mg by mouth daily. May take with food to minimize GI side effects.   ibuprofen 800 MG tablet Commonly known as: ADVIL Take 1 tablet (  800 mg total) by mouth every 8 (eight) hours as needed for headache, mild pain, moderate pain or cramping.   naloxone 0.4 MG/ML injection Commonly known as: NARCAN As needed   ondansetron 4 MG tablet Commonly known as:  ZOFRAN TAKE 1 TABLET BY MOUTH EVERY 8 HOURS AS NEEDED FOR NAUSEA AND VOMITING What changed: See the new instructions.   Oxbryta 500 MG Tabs tablet Generic drug: voxelotor Take 1,500 mg by mouth daily. For Sickle Cell Anemia   Oxycodone HCl 20 MG Tabs Take 1 tablet (20 mg total) by mouth every 4 (four) hours as needed (for pain). Start taking on: September 23, 2019   tiZANidine 4 MG capsule Commonly known as: ZANAFLEX Take 1 capsule (4 mg total) by mouth 3 (three) times daily. What changed:   when to take this  reasons to take this   Vitamin D (Ergocalciferol) 1.25 MG (50000 UNIT) Caps capsule Commonly known as: DRISDOL Take 1 capsule (50,000 Units total) by mouth every 7 (seven) days.   Xtampza ER 18 MG C12a Generic drug: oxyCODONE ER Take 18 mg by mouth every 12 (twelve) hours. Start taking on: September 29, 2019      No Known Allergies   Significant Diagnostic Studies: No results found.  Signed:  Jeanann Lewandowsky MD, MHA, FACP, Constance Goltz, CPE   09/20/2019, 3:18 PM

## 2019-09-21 ENCOUNTER — Telehealth (HOSPITAL_COMMUNITY): Payer: Self-pay | Admitting: General Practice

## 2019-09-21 ENCOUNTER — Ambulatory Visit: Payer: Self-pay | Admitting: Family Medicine

## 2019-09-21 ENCOUNTER — Ambulatory Visit: Payer: Self-pay | Admitting: Clinical

## 2019-09-21 ENCOUNTER — Encounter: Payer: Self-pay | Admitting: Family Medicine

## 2019-09-21 NOTE — Telephone Encounter (Signed)
Patient called requesting to come to the [facility] for 7/10 left leg pain. The day hospital does not have the capacity to admit the patient today. Patient told to go to the ER if the pain is intolerable. Patient also wanted to know the status of  her medication refill. Per provider, patient should be able to pick up the Oxycodone refill at the pharmacy tomorrow. Patient told, verbalized understanding.

## 2019-09-21 NOTE — Progress Notes (Signed)
Virtual Visit via Telephone Note  I connected with Stephanie Burch on 09/21/19 at  8:20 AM EDT by telephone and verified that I am speaking with the correct person using two identifiers.   I discussed the limitations, risks, security and privacy concerns of performing an evaluation and management service by telephone and the availability of in person appointments. I also discussed with the patient that there may be a patient responsible charge related to this service. The patient expressed understanding and agreed to proceed.  Televisit Today Patient Location: Home Provider Location: Office  History of Present Illness:  No past surgical history on file.   Social History   Socioeconomic History  . Marital status: Single    Spouse name: Not on file  . Number of children: Not on file  . Years of education: Not on file  . Highest education level: Not on file  Occupational History  . Not on file  Tobacco Use  . Smoking status: Never Smoker  . Smokeless tobacco: Never Used  Vaping Use  . Vaping Use: Never used  Substance and Sexual Activity  . Alcohol use: Not Currently  . Drug use: Never  . Sexual activity: Yes    Birth control/protection: None  Other Topics Concern  . Not on file  Social History Narrative  . Not on file   Social Determinants of Health   Financial Resource Strain:   . Difficulty of Paying Living Expenses:   Food Insecurity:   . Worried About Programme researcher, broadcasting/film/video in the Last Year:   . Barista in the Last Year:   Transportation Needs:   . Freight forwarder (Medical):   Marland Kitchen Lack of Transportation (Non-Medical):   Physical Activity:   . Days of Exercise per Week:   . Minutes of Exercise per Session:   Stress:   . Feeling of Stress :   Social Connections:   . Frequency of Communication with Friends and Family:   . Frequency of Social Gatherings with Friends and Family:   . Attends Religious Services:   . Active Member of Clubs or Organizations:    . Attends Banker Meetings:   Marland Kitchen Marital Status:   Intimate Partner Violence:   . Fear of Current or Ex-Partner:   . Emotionally Abused:   Marland Kitchen Physically Abused:   . Sexually Abused:     Family History  Problem Relation Age of Onset  . Hypertension Mother   . Sickle cell trait Mother   . Diabetes Father   . Sickle cell trait Father     Past Medical History:  Diagnosis Date  . Nausea   . Sickle cell anemia (HCC)   . Vitamin B12 deficiency 12/2018  . Vitamin D deficiency     No Known Allergies   No orders of the defined types were placed in this encounter.  Current Status: Since her last office visit, she has had a few Hospital Admissions for sickle cell crisis. She states that she has pain in her legs. She rates her pain at 8/10 today and would like to report to [facility] today. She has not had a hospital visit for Sickle Cell Crisis since 08/10/2019 where she was treated and discharged on 08/14/2019. She is currently taking all medications as prescribed and staying well hydrated. She reports occasional nausea, constipation, dizziness and headaches. She denies fevers, chills, fatigue, recent infections, weight loss, and night sweats. She has not had any visual changes, and falls. No  chest pain, heart palpitations, cough and shortness of breath reported. Denies GI problems such as vomiting, and diarrhea. She has no reports of blood in stools, dysuria and hematuria. Her anxiety is increased today r/t her pain management. She is not taking all medications as prescribed. She has been taking her pain medications more often than prescribed.   Observations/Objective:  Telephone Visit  Assessment and Plan:  1. Hospital discharge follow-up  2. Hb-SS disease without crisis [facility]) [facility] and possible IV pain medicine, and anti-emetics to aid in pain crisis. She will continue to take pain medications as prescribed; will  continue to avoid extreme heat and cold; will continue to eat a healthy diet and drink at least 64 ounces of water daily; continue stool softener as needed; will avoid colds and flu; will continue to get plenty of sleep and rest; will continue to avoid high stressful situations and remain infection free; will continue Folic Acid 1 mg daily to avoid sickle cell crisis. Continue to follow up with Hematologist as needed.   3. Chronic, continuous use of opioids  4. Chronic pain syndrome  5. Muscle spasms of both lower extremities  6. Anxiety Moderate r/t health status.   7. Follow up She will follow up in 2 months.   No orders of the defined types were placed in this encounter.   No orders of the defined types were placed in this encounter.   Referral Orders  No referral(s) requested today    [name],  MSN, FNP-BC [facility]/Internal Medicine/Sickle Cell Center [facility] [address] [place], [place] [address] [phone] fax  I discussed the assessment and treatment plan with the patient. The patient was provided an opportunity to ask questions and all were answered. The patient agreed with the plan and demonstrated an understanding of the instructions.   The patient was advised to call back or seek an in-person evaluation if the symptoms worsen or if the condition fails to improve as anticipated.  I provided 20 minutes of non-face-to-face time during this encounter.   Kallie Locks, FNP

## 2019-09-22 ENCOUNTER — Telehealth (HOSPITAL_COMMUNITY): Payer: Self-pay | Admitting: *Deleted

## 2019-09-22 NOTE — Telephone Encounter (Signed)
Returned patient's call regarding medication refill. Patient's Oxycodone 20 mg IR originally due on Friday 09/23/19. Dr. Hyman Hopes authorized patient's refill of Oxycodone 20 mg IR for today 8/19. This RN called patient's pharmacy and advised of Dr. Louis Meckel authorization for patient to received her medication 1 day early. Patient notified that she should now be able to pick up medications today. Patient expresses an understanding.

## 2019-10-04 ENCOUNTER — Other Ambulatory Visit: Payer: Self-pay | Admitting: Family Medicine

## 2019-10-04 ENCOUNTER — Telehealth: Payer: Self-pay | Admitting: Family Medicine

## 2019-10-04 ENCOUNTER — Encounter: Payer: Self-pay | Admitting: Family Medicine

## 2019-10-04 DIAGNOSIS — D5700 Hb-SS disease with crisis, unspecified: Secondary | ICD-10-CM

## 2019-10-04 DIAGNOSIS — D571 Sickle-cell disease without crisis: Secondary | ICD-10-CM

## 2019-10-04 DIAGNOSIS — G894 Chronic pain syndrome: Secondary | ICD-10-CM

## 2019-10-04 DIAGNOSIS — Z79899 Other long term (current) drug therapy: Secondary | ICD-10-CM

## 2019-10-04 DIAGNOSIS — F1190 Opioid use, unspecified, uncomplicated: Secondary | ICD-10-CM

## 2019-10-04 MED ORDER — TIZANIDINE HCL 4 MG PO CAPS: 4.0000 mg | ORAL_CAPSULE | Freq: Three times a day (TID) | ORAL | 6 refills | Status: DC | PRN

## 2019-10-04 MED ORDER — OXYCODONE HCL 20 MG PO TABS: 20.0000 mg | ORAL_TABLET | ORAL | 0 refills | Status: DC | PRN

## 2019-10-05 NOTE — Telephone Encounter (Signed)
Done

## 2019-10-15 ENCOUNTER — Encounter (HOSPITAL_COMMUNITY): Payer: Self-pay

## 2019-10-15 ENCOUNTER — Inpatient Hospital Stay (HOSPITAL_COMMUNITY)
Admission: EM | Admit: 2019-10-15 | Discharge: 2019-10-20 | DRG: 812 | Disposition: A | Discharge status: Home / Self Care | Payer: Medicaid Other | Attending: Internal Medicine | Admitting: Internal Medicine

## 2019-10-15 ENCOUNTER — Other Ambulatory Visit: Payer: Self-pay

## 2019-10-15 DIAGNOSIS — Z79899 Other long term (current) drug therapy: Secondary | ICD-10-CM

## 2019-10-15 DIAGNOSIS — D5700 Hb-SS disease with crisis, unspecified: Secondary | ICD-10-CM | POA: Diagnosis present

## 2019-10-15 DIAGNOSIS — F418 Other specified anxiety disorders: Secondary | ICD-10-CM | POA: Diagnosis present

## 2019-10-15 DIAGNOSIS — D57219 Sickle-cell/Hb-C disease with crisis, unspecified: Secondary | ICD-10-CM | POA: Diagnosis not present

## 2019-10-15 DIAGNOSIS — F329 Major depressive disorder, single episode, unspecified: Secondary | ICD-10-CM | POA: Diagnosis not present

## 2019-10-15 DIAGNOSIS — B9789 Other viral agents as the cause of diseases classified elsewhere: Secondary | ICD-10-CM

## 2019-10-15 DIAGNOSIS — M79604 Pain in right leg: Secondary | ICD-10-CM | POA: Diagnosis not present

## 2019-10-15 DIAGNOSIS — F419 Anxiety disorder, unspecified: Secondary | ICD-10-CM | POA: Diagnosis not present

## 2019-10-15 DIAGNOSIS — M79605 Pain in left leg: Secondary | ICD-10-CM | POA: Diagnosis not present

## 2019-10-15 DIAGNOSIS — Z20822 Contact with and (suspected) exposure to covid-19: Secondary | ICD-10-CM | POA: Diagnosis present

## 2019-10-15 DIAGNOSIS — E559 Vitamin D deficiency, unspecified: Secondary | ICD-10-CM | POA: Diagnosis present

## 2019-10-15 DIAGNOSIS — Z6841 Body Mass Index (BMI) 40.0 and over, adult: Secondary | ICD-10-CM

## 2019-10-15 DIAGNOSIS — D638 Anemia in other chronic diseases classified elsewhere: Secondary | ICD-10-CM | POA: Diagnosis present

## 2019-10-15 DIAGNOSIS — G894 Chronic pain syndrome: Secondary | ICD-10-CM | POA: Diagnosis present

## 2019-10-15 DIAGNOSIS — U071 COVID-19: Secondary | ICD-10-CM

## 2019-10-15 DIAGNOSIS — Z832 Family history of diseases of the blood and blood-forming organs and certain disorders involving the immune mechanism: Secondary | ICD-10-CM

## 2019-10-15 LAB — CBC WITH DIFFERENTIAL/PLATELET
Abs Immature Granulocytes: 0.05 10*3/uL (ref 0.00–0.07)
Basophils Absolute: 0 10*3/uL (ref 0.0–0.1)
Basophils Relative: 0 %
Eosinophils Absolute: 0.1 10*3/uL (ref 0.0–0.5)
Eosinophils Relative: 1 %
HCT: 36.1 % (ref 36.0–46.0)
Hemoglobin: 12.5 g/dL (ref 12.0–15.0)
Immature Granulocytes: 1 %
Lymphocytes Relative: 19 %
Lymphs Abs: 1.9 10*3/uL (ref 0.7–4.0)
MCH: 25.3 pg — ABNORMAL LOW (ref 26.0–34.0)
MCHC: 34.6 g/dL (ref 30.0–36.0)
MCV: 72.9 fL — ABNORMAL LOW (ref 80.0–100.0)
Monocytes Absolute: 0.7 10*3/uL (ref 0.1–1.0)
Monocytes Relative: 8 %
Neutro Abs: 6.9 10*3/uL (ref 1.7–7.7)
Neutrophils Relative %: 71 %
Platelets: 275 10*3/uL (ref 150–400)
RBC: 4.95 MIL/uL (ref 3.87–5.11)
RDW: 14.2 % (ref 11.5–15.5)
WBC: 9.7 10*3/uL (ref 4.0–10.5)
nRBC: 0.3 % — ABNORMAL HIGH (ref 0.0–0.2)

## 2019-10-15 LAB — I-STAT BETA HCG BLOOD, ED (MC, WL, AP ONLY): I-stat hCG, quantitative: <5 m[IU]/mL (ref <5)

## 2019-10-15 LAB — COMPREHENSIVE METABOLIC PANEL
ALT: 15 U/L (ref 0–44)
AST: 26 U/L (ref 15–41)
Albumin: 3.9 g/dL (ref 3.5–5.0)
Alkaline Phosphatase: 78 U/L (ref 38–126)
Anion gap: 10 (ref 5–15)
BUN: 9 mg/dL (ref 6–20)
CO2: 25 mmol/L (ref 22–32)
Calcium: 9.6 mg/dL (ref 8.9–10.3)
Chloride: 105 mmol/L (ref 98–111)
Creatinine, Ser: 0.76 mg/dL (ref 0.44–1.00)
GFR calc Af Amer: >60 mL/min (ref >60)
GFR calc non Af Amer: >60 mL/min (ref >60)
Glucose, Bld: 96 mg/dL (ref 70–99)
Potassium: 4.7 mmol/L (ref 3.5–5.1)
Sodium: 140 mmol/L (ref 135–145)
Total Bilirubin: 0.9 mg/dL (ref 0.3–1.2)
Total Protein: 7.8 g/dL (ref 6.5–8.1)

## 2019-10-15 LAB — RETICULOCYTES
Immature Retic Fract: 31 % — ABNORMAL HIGH (ref 2.3–15.9)
RBC.: 4.91 MIL/uL (ref 3.87–5.11)
Retic Count, Absolute: 194.9 10*3/uL — ABNORMAL HIGH (ref 19.0–186.0)
Retic Ct Pct: 4 % — ABNORMAL HIGH (ref 0.4–3.1)

## 2019-10-15 LAB — SARS CORONAVIRUS 2 BY RT PCR (HOSPITAL ORDER, PERFORMED IN ~~LOC~~ HOSPITAL LAB): SARS Coronavirus 2: NEGATIVE

## 2019-10-15 MED ORDER — SODIUM CHLORIDE 0.9 % IV SOLN
25.0000 mg | INTRAVENOUS | Status: DC | PRN
  Filled 2019-10-15: qty 0.5

## 2019-10-15 MED ORDER — KETOROLAC TROMETHAMINE 30 MG/ML IJ SOLN
30.0000 mg | Freq: Four times a day (QID) | INTRAMUSCULAR | Status: DC
  Administered 2019-10-15 – 2019-10-17 (×8): 30 mg via INTRAVENOUS
  Filled 2019-10-15 (×8): qty 1

## 2019-10-15 MED ORDER — NALOXONE HCL 0.4 MG/ML IJ SOLN: 0.4000 mg | INTRAMUSCULAR | Status: DC | PRN

## 2019-10-15 MED ORDER — ENOXAPARIN SODIUM 40 MG/0.4ML ~~LOC~~ SOLN
40.0000 mg | SUBCUTANEOUS | Status: DC
  Administered 2019-10-15: 40 mg via SUBCUTANEOUS
  Filled 2019-10-15: qty 0.4

## 2019-10-15 MED ORDER — HYDROMORPHONE HCL 2 MG/ML IJ SOLN
2.0000 mg | INTRAMUSCULAR | Status: DC | PRN
  Administered 2019-10-15 – 2019-10-16 (×11): 2 mg via INTRAVENOUS
  Filled 2019-10-15 (×12): qty 1

## 2019-10-15 MED ORDER — ONDANSETRON HCL 4 MG/2ML IJ SOLN
4.0000 mg | Freq: Once | INTRAMUSCULAR | Status: AC
  Administered 2019-10-15: 4 mg via INTRAVENOUS
  Filled 2019-10-15: qty 2

## 2019-10-15 MED ORDER — KETOROLAC TROMETHAMINE 30 MG/ML IJ SOLN
30.0000 mg | Freq: Once | INTRAMUSCULAR | Status: AC
  Administered 2019-10-15: 30 mg via INTRAVENOUS
  Filled 2019-10-15: qty 1

## 2019-10-15 MED ORDER — VITAMIN D (ERGOCALCIFEROL) 1.25 MG (50000 UNIT) PO CAPS
50000.0000 [IU] | ORAL_CAPSULE | ORAL | Status: DC
  Filled 2019-10-15: qty 1

## 2019-10-15 MED ORDER — SODIUM CHLORIDE 0.45 % IV SOLN: INTRAVENOUS | Status: DC

## 2019-10-15 MED ORDER — POLYETHYLENE GLYCOL 3350 17 G PO PACK: 17.0000 g | PACK | Freq: Every day | ORAL | Status: DC | PRN

## 2019-10-15 MED ORDER — VOXELOTOR 500 MG PO TABS
1500.0000 mg | ORAL_TABLET | Freq: Every day | ORAL | Status: DC
  Administered 2019-10-17 – 2019-10-19 (×2): 1500 mg via ORAL

## 2019-10-15 MED ORDER — IBUPROFEN 800 MG PO TABS: 800.0000 mg | ORAL_TABLET | Freq: Three times a day (TID) | ORAL | Status: DC | PRN

## 2019-10-15 MED ORDER — ACETAMINOPHEN 325 MG PO TABS
650.0000 mg | ORAL_TABLET | Freq: Once | ORAL | Status: AC
  Administered 2019-10-15: 650 mg via ORAL
  Filled 2019-10-15: qty 2

## 2019-10-15 MED ORDER — TIZANIDINE HCL 4 MG PO TABS: 4.0000 mg | ORAL_TABLET | Freq: Three times a day (TID) | ORAL | Status: DC | PRN

## 2019-10-15 MED ORDER — OXYCODONE HCL 5 MG PO TABS: 20.0000 mg | ORAL_TABLET | ORAL | Status: DC | PRN

## 2019-10-15 MED ORDER — OXYCODONE HCL ER 10 MG PO T12A
20.0000 mg | EXTENDED_RELEASE_TABLET | Freq: Two times a day (BID) | ORAL | Status: DC
  Administered 2019-10-15 – 2019-10-20 (×10): 20 mg via ORAL
  Filled 2019-10-15 (×10): qty 2

## 2019-10-15 MED ORDER — ONDANSETRON HCL 4 MG/2ML IJ SOLN
4.0000 mg | Freq: Four times a day (QID) | INTRAMUSCULAR | Status: DC | PRN
  Administered 2019-10-15 – 2019-10-16 (×3): 4 mg via INTRAVENOUS
  Filled 2019-10-15 (×3): qty 2

## 2019-10-15 MED ORDER — DIPHENHYDRAMINE HCL 25 MG PO CAPS: 25.0000 mg | ORAL_CAPSULE | ORAL | Status: DC | PRN

## 2019-10-15 MED ORDER — SENNOSIDES-DOCUSATE SODIUM 8.6-50 MG PO TABS
1.0000 | ORAL_TABLET | Freq: Two times a day (BID) | ORAL | Status: DC
  Administered 2019-10-17 – 2019-10-20 (×7): 1 via ORAL
  Filled 2019-10-15 (×9): qty 1

## 2019-10-15 MED ORDER — DEXTROSE-NACL 5-0.45 % IV SOLN: INTRAVENOUS | Status: DC

## 2019-10-15 MED ORDER — HYDROMORPHONE HCL 2 MG/ML IJ SOLN
2.0000 mg | INTRAMUSCULAR | Status: AC | PRN
  Administered 2019-10-15 (×3): 2 mg via INTRAVENOUS
  Filled 2019-10-15 (×3): qty 1

## 2019-10-15 MED ORDER — FOLIC ACID 1 MG PO TABS
1.0000 mg | ORAL_TABLET | Freq: Every day | ORAL | Status: DC
  Administered 2019-10-15 – 2019-10-20 (×6): 1 mg via ORAL
  Filled 2019-10-15 (×6): qty 1

## 2019-10-15 MED ORDER — SODIUM CHLORIDE 0.9% FLUSH: 9.0000 mL | INTRAVENOUS | Status: DC | PRN

## 2019-10-15 NOTE — ED Triage Notes (Signed)
Pt reports L arm pain and bilateral leg pain related to sickle cell. States that it has worsened over the last few days and she has been trying to manage it at home with her meds, but has not been able to. She denies vomiting. No chest pain or SOB.

## 2019-10-15 NOTE — H&P (Signed)
Stephanie Burch is an 39 y.o. female.   Chief Complaint: Pain all over especially left arm and lower back  HPI: Patient is a 39 year old female with known history of sickle cell disease presenting with significant pain in her left lower back and legs.  Pain is rated as 9 out of 10.  Consistent with her typical sickle cell crisis.  She is taking her home regimen but not working.  Denied any fever or chills denied any nausea vomiting or diarrhea.  Past Medical History:  Diagnosis Date  . Nausea   . Sickle cell anemia (HCC)   . Vitamin B12 deficiency 12/2018  . Vitamin D deficiency     History reviewed. No pertinent surgical history.  Family History  Problem Relation Age of Onset  . Hypertension Mother   . Sickle cell trait Mother   . Diabetes Father   . Sickle cell trait Father    Social History:  reports that she has never smoked. She has never used smokeless tobacco. She reports previous alcohol use. She reports that she does not use drugs.  Allergies: No Known Allergies  (Not in a hospital admission)   Results for orders placed or performed during the hospital encounter of 10/15/19 (from the past 48 hour(s))  Comprehensive metabolic panel     Status: None   Collection Time: 10/15/19  7:52 AM  Result Value Ref Range   Sodium 140 135 - 145 mmol/L   Potassium 4.7 3.5 - 5.1 mmol/L   Chloride 105 98 - 111 mmol/L   CO2 25 22 - 32 mmol/L   Glucose, Bld 96 70 - 99 mg/dL    Comment: Glucose reference range applies only to samples taken after fasting for at least 8 hours.   BUN 9 6 - 20 mg/dL   Creatinine, Ser 6.38 0.44 - 1.00 mg/dL   Calcium 9.6 8.9 - 75.6 mg/dL   Total Protein 7.8 6.5 - 8.1 g/dL   Albumin 3.9 3.5 - 5.0 g/dL   AST 26 15 - 41 U/L   ALT 15 0 - 44 U/L   Alkaline Phosphatase 78 38 - 126 U/L   Total Bilirubin 0.9 0.3 - 1.2 mg/dL   GFR calc non Af Amer >60 >60 mL/min   GFR calc Af Amer >60 >60 mL/min   Anion gap 10 5 - 15    Comment: Performed at [facility], [address] [place], [place] [address]  CBC with Differential     Status: Abnormal   Collection Time: 10/15/19  7:52 AM  Result Value Ref Range   WBC 9.7 4.0 - 10.5 K/uL   RBC 4.95 3.87 - 5.11 MIL/uL   Hemoglobin 12.5 12.0 - 15.0 g/dL   HCT 51.8 36 - 46 %   MCV 72.9 (L) 80.0 - 100.0 fL   MCH 25.3 (L) 26.0 - 34.0 pg   MCHC 34.6 30.0 - 36.0 g/dL   RDW 84.1 66.0 - 63.0 %   Platelets 275 150 - 400 K/uL   nRBC 0.3 (H) 0.0 - 0.2 %   Neutrophils Relative % 71 %   Neutro Abs 6.9 1.7 - 7.7 K/uL   Lymphocytes Relative 19 %   Lymphs Abs 1.9 0.7 - 4.0 K/uL   Monocytes Relative 8 %   Monocytes Absolute 0.7 0 - 1 K/uL   Eosinophils Relative 1 %   Eosinophils Absolute 0.1 0 - 0 K/uL   Basophils Relative 0 %   Basophils Absolute 0.0 0 -  0 K/uL   Immature Granulocytes 1 %   Abs Immature Granulocytes 0.05 0.00 - 0.07 K/uL    Comment: Performed at [facility], [address] [place], [place] [address]  Reticulocytes     Status: Abnormal   Collection Time: 10/15/19  7:52 AM  Result Value Ref Range   Retic Ct Pct 4.0 (H) 0.4 - 3.1 %   RBC. 4.91 3.87 - 5.11 MIL/uL   Retic Count, Absolute 194.9 (H) 19.0 - 186.0 K/uL   Immature Retic Fract 31.0 (H) 2.3 - 15.9 %    Comment: Performed at [facility], [address], [place], [place] [address]  I-Stat beta hCG blood, ED     Status: None   Collection Time: 10/15/19  7:57 AM  Result Value Ref Range   I-stat hCG, quantitative <5.0 <5 mIU/mL   Comment 3            Comment:   GEST. AGE      CONC.  (mIU/mL)   <=1 WEEK        5 - 50     2 WEEKS       50 - 500     3 WEEKS       100 - 10,000     4 WEEKS     1,000 - 30,000        FEMALE AND NON-PREGNANT FEMALE:     LESS THAN 5 mIU/mL    No results found.  Review of Systems  Constitutional: Positive for fatigue.  HENT: Negative.   Eyes: Negative.   Respiratory: Negative.   Cardiovascular: Negative.   Gastrointestinal: Negative.    Endocrine: Negative.   Genitourinary: Negative.   Musculoskeletal: Positive for arthralgias, joint swelling and myalgias.  Skin: Negative.   Neurological: Negative.   Hematological: Negative.   Psychiatric/Behavioral: Negative.     Blood pressure 120/72, pulse 65, temperature 98.6 F (37 C), temperature source Oral, resp. rate 18, height 5\' 5"  (1.651 m), weight 127 kg, SpO2 98 %. Physical Exam Constitutional:      Appearance: She is obese.  HENT:     Head: Normocephalic and atraumatic.     Right Ear: Tympanic membrane normal.     Left Ear: Tympanic membrane normal.     Nose: Nose normal.     Mouth/Throat:     Mouth: Mucous membranes are moist.  Eyes:     Pupils: Pupils are equal, round, and reactive to light.  Cardiovascular:     Rate and Rhythm: Normal rate and regular rhythm.     Pulses: Normal pulses.     Heart sounds: Normal heart sounds.  Pulmonary:     Effort: Pulmonary effort is normal.     Breath sounds: Normal breath sounds.  Abdominal:     General: Bowel sounds are normal.     Palpations: Abdomen is soft.  Musculoskeletal:        General: Normal range of motion.     Cervical back: Normal range of motion and neck supple.  Skin:    General: Skin is warm and dry.  Neurological:     General: No focal deficit present.     Mental Status: She is alert.      Assessment/Plan A 39 year old female with known history of sickle cell disease admitted with sickle cell painful crisis  #1 sickle cell painful crisis: Patient will be admitted and initiated on Dilaudid PCA, Toradol and IV fluids.  We will initiate Dilaudid IV  2 mg every 2 hours as needed until patient can get PCA started.  Continue long-acting home medications.  #2 anemia of chronic disease: H&H appears to be at baseline.  Continue monitoring.  #3 chronic pain syndrome: Resume home OxyContin.  Continue to monitor.  #4 depression with anxiety: Continue treatment.  Continue home regimen.  Lonia Blood,  MD 10/15/2019, 11:54 AM

## 2019-10-15 NOTE — ED Provider Notes (Signed)
[facility]-EMERGENCY DEPT Provider Note   CSN: [id number] Arrival date & time: 10/15/19  0546     History Chief Complaint  Patient presents with  . Sickle Cell Pain Crisis    Stephanie Burch is a 39 y.o. female with PMH significant for sickle cell anemia, opiate dependence and tolerance, and chronic pain syndrome who presents the ED with complaints of left forearm and bilateral upper leg pain consistent with her history of sickle cell crises.  She notes that she has had imaging in the past which have been negative for avascular necrosis.  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  No shoulder pain today.  She reports that her pain symptoms began approximately 3 days ago and have not improved with her home medications.  She attempted to go to the sickle cell clinic yesterday, but states that they were closed.  Patient denies any obvious precipitating event or trigger.  Patient is taking Gardenia PhlegmOxbryta as well as oxycodone 20 mg every 4 hours as needed for pain.  Patient reports that she has had some success with Oxbryta, but she had difficulty tolerating the hydroxyurea.  She is followed by her primary care provider, Raliegh IpNatalie Stroud FNP.  No specific hematologist.  Patient denies any chest pain or shortness of breath, abdominal pain, fevers or chills, numbness or weakness, headache or dizziness, or other symptoms.  Patient has not been vaccinated for COVID-19.  HPI     Past Medical History:  Diagnosis Date  . Nausea   . Sickle cell anemia (HCC)   . Vitamin B12 deficiency 12/2018  . Vitamin D deficiency     Patient Active Problem List   Diagnosis Date Noted  . Urinary frequency 04/20/2019  . Vaginal odor 04/20/2019  . Chest pain varying with breathing   . Fever and chills   . Anxiety 11/15/2018  . Vitamin D deficiency 11/15/2018  . Nausea 11/15/2018  .  Abnormal pulse oximetry   . Hypokalemia 09/20/2018  . Medication management 09/14/2018  . Muscle spasms of both lower extremities 08/25/2018  . Subjective visual disturbance of right eye 08/25/2018  . 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 Chronic pain syndrome 03/16/2018  . Chronic, continuous use of opioids 03/16/2018  . Tachycardia with heart rate 100-120 beats per minute   . Sickle cell anemia with crisis (HCC) 10/19/2017  . Leukocytosis 10/19/2017  . Thrombocytopenia (HCC) 10/19/2017  . Morbidly obese (HCC) 10/19/2017  . Sickle cell pain crisis (HCC) 10/15/2017    History reviewed. No pertinent surgical history.   OB History    Gravida  3   Para      Term      Preterm      AB      Living  3     SAB      TAB      Ectopic      Multiple      Live Births              Family History  Problem Relation Age of Onset  . Hypertension Mother   . Sickle cell trait Mother   . Diabetes Father   . Sickle cell trait Father     Social History   Tobacco Use  . Smoking status: Never Smoker  . Smokeless tobacco: Never Used  Vaping Use  . Vaping Use: Never used  Substance Use Topics  . Alcohol use: Not Currently  . Drug use: Never    Home Medications Prior to Admission medications   Medication Sig Start Date End Date Taking? Authorizing Provider  ibuprofen (ADVIL) 800 MG tablet Take 1 tablet (800 mg total) by mouth every 8 (eight) hours as needed for headache, mild pain, moderate pain or cramping. 08/15/19  Yes Kallie Locks, FNP  naloxone [facility]) 0.4 MG/ML injection As needed 09/14/18  Yes Kallie Locks, FNP  ondansetron (ZOFRAN) 4 MG tablet TAKE 1 TABLET BY MOUTH EVERY 8 HOURS AS NEEDED FOR NAUSEA AND VOMITING Patient taking differently: Take 4 mg by mouth every 8 (eight) hours as needed for nausea or vomiting.  07/14/19  Yes Kallie Locks, FNP  oxyCODONE ER [facility]) 18 MG C12A Take 18 mg by mouth every 12 (twelve) hours. 09/29/19   Yes Kallie Locks, FNP  Oxycodone HCl 20 MG TABS Take 1 tablet (20 mg total) by mouth every 4 (four) hours as needed (for pain). 10/06/19  Yes Kallie Locks, FNP  tiZANidine (ZANAFLEX) 4 MG capsule Take 1 capsule (4 mg total) by mouth 3 (three) times daily as needed for muscle spasms. 10/07/19 05/04/20 Yes Kallie Locks, FNP  voxelotor (OXBRYTA) 500 MG TABS tablet Take 1,500 mg by mouth daily. For Sickle Cell Anemia   Yes [provider]  DULoxetine (CYMBALTA) 20 MG capsule Take 20 mg by mouth daily. Patient not taking: Reported on 08/11/2019    [provider]  folic acid (FOLVITE) 1 MG tablet Take 1 tablet (1 mg total) by mouth daily. 11/15/18   Kallie Locks, FNP  hydroxyurea (HYDREA) 500 MG capsule Take 2,000 mg by mouth daily. May take with food to minimize GI side effects. Patient not taking: Reported on 08/11/2019    [provider]  Vitamin D, Ergocalciferol, (DRISDOL) 1.25 MG (50000 UT) CAPS capsule Take 1 capsule (50,000 Units total) by mouth every 7 (seven) days. 12/21/18   Kallie Locks, FNP    Allergies    Patient has no known allergies.  Review of Systems   Review of Systems  All other systems reviewed and are negative.   Physical Exam Updated Vital Signs BP 120/72   Pulse 65   Temp 98.6 F (37 C) (Oral)   Resp 18   Ht 5\' 5"  (1.651 m)   Wt 127 kg   SpO2 98%   BMI 46.59 kg/m   Physical Exam Vitals and nursing note reviewed. Exam conducted with a chaperone present.  Constitutional:      Comments: Tearful. In discomfort.  HENT:     Head: Normocephalic and atraumatic.  Eyes:     General: No scleral icterus.    Conjunctiva/sclera: Conjunctivae normal.  Cardiovascular:     Rate and Rhythm: Normal rate and regular rhythm.     Pulses: Normal pulses.     Heart sounds: Normal heart sounds.  Pulmonary:     Effort: Pulmonary effort is normal. No respiratory distress.     Breath sounds: Normal breath sounds.  Abdominal:      Comments: Soft, nondistended.  No areas of TTP.  No overlying skin changes.  No guarding.  Musculoskeletal:     Cervical back: Normal range of motion and neck supple. No rigidity.     Right lower leg: No edema.     Left lower leg: No edema.     Comments: TTP over left forearm and thighs bilaterally.  Peripheral pulses  intact and symmetric.  Extremities are warm.  ROM and strength intact.  Sensation intact throughout.  Skin:    General: Skin is dry.     Capillary Refill: Capillary refill takes less than 2 seconds.  Neurological:     Mental Status: She is alert and oriented to person, place, and time.     GCS: GCS eye subscore is 4. GCS verbal subscore is 5. GCS motor subscore is 6.  Psychiatric:        Mood and Affect: Mood normal.        Behavior: Behavior normal.        Thought Content: Thought content normal.     ED Results / Procedures / Treatments   Labs (all labs ordered are listed, but only abnormal results are displayed) Labs Reviewed  CBC WITH DIFFERENTIAL/PLATELET - Abnormal; Notable for the following components:      Result Value   MCV 72.9 (*)    MCH 25.3 (*)    nRBC 0.3 (*)    All other components within normal limits  RETICULOCYTES - Abnormal; Notable for the following components:   Retic Ct Pct 4.0 (*)    Retic Count, Absolute 194.9 (*)    Immature Retic Fract 31.0 (*)    All other components within normal limits  SARS CORONAVIRUS 2 BY RT PCR (HOSPITAL ORDER, PERFORMED IN [facility])  COMPREHENSIVE METABOLIC PANEL  I-STAT BETA HCG BLOOD, ED ([facility], [facility], [facility] ONLY)    EKG None  Radiology No results found.  Procedures Procedures (including critical care time)  Medications Ordered in ED Medications  0.45 % sodium chloride infusion ( Intravenous New Bag/Given 10/15/19 1033)  HYDROmorphone (DILAUDID) injection 2 mg (2 mg Intravenous Given 10/15/19 1033)  ondansetron (ZOFRAN) injection 4 mg (4 mg Intravenous Given 10/15/19 0747)  ketorolac (TORADOL)  30 MG/ML injection 30 mg (30 mg Intravenous Given 10/15/19 0748)  acetaminophen (TYLENOL) tablet 650 mg (650 mg Oral Given 10/15/19 0911)    ED Course  I have reviewed the triage vital signs and the nursing notes.  Pertinent labs & imaging results that were available during my care of the patient were reviewed by me and considered in my medical decision making (see chart for details).  Clinical Course as of Oct 15 1147  Sat Oct 15, 2019  1148 Spoke with Dr. Mikeal Hawthorne who will see and admit patient for ongoing pain control.   [GG]    Clinical Course User Index [GG] Lorelee New, PA-C   MDM Rules/Calculators/A&P                          Patient reported pain involving left forearm and thighs bilaterally is consistent with her history of sickle cell crises.  She states that she has been taking her medications at home, as directed, without any improvement.  She denies any fevers, chills, chest pain, shortness of breath, abdominal pain, nausea or vomiting, numbness or weakness, other focal neurologic deficits, or other symptoms.  She has been eating and drinking at home without issue.  Initial vital signs stable and WNL.  No obvious signs of dehydration.  Will obtain basic laboratory work-up.  We will also plan to treat with 30 mg Toradol IV, Zofran 4 mg IV, and then Dilaudid 2 mg IV PRN x 3.  Will administer sodium chloride 0.45% 100 cc/hr.    On subsequent evaluation, patient is not feeling improved after Toradol and first dose of Dilaudid.  She does feel as though her nausea is well controlled after the Zofran and she denies any pruritus.  Lab work is reviewed which demonstrates elevated reticulocyte count consistent with sickle cell crisis.  CBC with differential was reviewed and without any anemia or leukocytosis concerning for infection.  CMP entirely unremarkable.  After second dose of IV Dilaudid 2 mg, patient is still not feeling any improvement.  I discussed hope for improvement to point  that she would feel agreeable to discharge and continued home medications.    After her final dose of Dilaudid, I reevaluated patient.  She was initially complaining of 10 pain involving her left forearm and thighs bilaterally.  After treating with Toradol, Tylenol, and 3 rounds of IV Dilaudid 2 mg, patient states that her pain is still 9 out of 10.  She has not had any significant relief with administration of analgesics here in the ED.  She will require admission to the hospitalist services.  Will consult sickle cell hospitalist on-call, Dr. Mikeal Hawthorne.  Spoke with Dr. Mikeal Hawthorne who will see and admit patient for ongoing pain control.  Stephanie Burch was evaluated in Emergency Department on 10/15/2019 for the symptoms described in the history of present illness. She was evaluated in the context of the global COVID-19 pandemic, which necessitated consideration that the patient might be at risk for infection with the SARS-CoV-2 virus that causes COVID-19. Institutional protocols and algorithms that pertain to the evaluation of patients at risk for COVID-19 are in a state of rapid change based on information released by regulatory bodies including the CDC and federal and state organizations. These policies and algorithms were followed during the patient's care in the ED.   Final Clinical Impression(s) / ED Diagnoses Final diagnoses:  Sickle cell pain crisis [facility])    Rx / DC Orders ED Discharge Orders    None       Lorelee New, PA-C 10/15/19 1149    Pollyann Savoy, MD 10/15/19 1319

## 2019-10-16 LAB — COMPREHENSIVE METABOLIC PANEL WITH GFR
ALT: 14 U/L (ref 0–44)
AST: 15 U/L (ref 15–41)
Albumin: 3.9 g/dL (ref 3.5–5.0)
Alkaline Phosphatase: 77 U/L (ref 38–126)
Anion gap: 11 (ref 5–15)
BUN: 9 mg/dL (ref 6–20)
CO2: 24 mmol/L (ref 22–32)
Calcium: 9.1 mg/dL (ref 8.9–10.3)
Chloride: 104 mmol/L (ref 98–111)
Creatinine, Ser: 0.54 mg/dL (ref 0.44–1.00)
GFR calc Af Amer: >60 mL/min
GFR calc non Af Amer: >60 mL/min
Glucose, Bld: 100 mg/dL — ABNORMAL HIGH (ref 70–99)
Potassium: 3.8 mmol/L (ref 3.5–5.1)
Sodium: 139 mmol/L (ref 135–145)
Total Bilirubin: 0.9 mg/dL (ref 0.3–1.2)
Total Protein: 7.2 g/dL (ref 6.5–8.1)

## 2019-10-16 LAB — CBC WITH DIFFERENTIAL/PLATELET
Abs Immature Granulocytes: 0.03 10*3/uL (ref 0.00–0.07)
Basophils Absolute: 0 10*3/uL (ref 0.0–0.1)
Basophils Relative: 0 %
Eosinophils Absolute: 0.2 10*3/uL (ref 0.0–0.5)
Eosinophils Relative: 2 %
HCT: 33.2 % — ABNORMAL LOW (ref 36.0–46.0)
Hemoglobin: 11.5 g/dL — ABNORMAL LOW (ref 12.0–15.0)
Immature Granulocytes: 0 %
Lymphocytes Relative: 28 %
Lymphs Abs: 2.5 10*3/uL (ref 0.7–4.0)
MCH: 25.3 pg — ABNORMAL LOW (ref 26.0–34.0)
MCHC: 34.6 g/dL (ref 30.0–36.0)
MCV: 73 fL — ABNORMAL LOW (ref 80.0–100.0)
Monocytes Absolute: 0.8 10*3/uL (ref 0.1–1.0)
Monocytes Relative: 9 %
Neutro Abs: 5.2 10*3/uL (ref 1.7–7.7)
Neutrophils Relative %: 61 %
Platelets: 207 10*3/uL (ref 150–400)
RBC: 4.55 MIL/uL (ref 3.87–5.11)
RDW: 13.9 % (ref 11.5–15.5)
WBC: 8.8 10*3/uL (ref 4.0–10.5)
nRBC: 0.7 % — ABNORMAL HIGH (ref 0.0–0.2)

## 2019-10-16 MED ORDER — ENOXAPARIN SODIUM 80 MG/0.8ML ~~LOC~~ SOLN
65.0000 mg | SUBCUTANEOUS | Status: DC
  Administered 2019-10-16 – 2019-10-17 (×2): 65 mg via SUBCUTANEOUS
  Filled 2019-10-16 (×5): qty 0.8

## 2019-10-16 MED ORDER — HYDROMORPHONE 1 MG/ML IV SOLN
INTRAVENOUS | Status: DC
  Administered 2019-10-16: 5.5 mg via INTRAVENOUS
  Administered 2019-10-16: 30 mg via INTRAVENOUS
  Administered 2019-10-16: 5.5 mg via INTRAVENOUS
  Administered 2019-10-16: 9.5 mg via INTRAVENOUS
  Administered 2019-10-17: 9 mg via INTRAVENOUS
  Administered 2019-10-17: 6.5 mg via INTRAVENOUS
  Administered 2019-10-17: 30 mg via INTRAVENOUS
  Filled 2019-10-16 (×2): qty 30

## 2019-10-16 NOTE — Progress Notes (Signed)
Subjective: 39 year old female with history of sickle cell disease who was admitted with sickle cell painful crisis.  Has been in the ER all day.  She has been there all night yesterday.  Pain is at 7 out of 10.  Associated with some nausea.  Not able to get PCA pump.  She has been on Dilaudid PCA 2 mg every 2 hours as needed Objective: Vital signs in last 24 hours: Temp:  [97.9 F (36.6 C)-98.8 F (37.1 C)] 98.4 F (36.9 C) (09/12 2059) Pulse Rate:  [68-102] 86 (09/12 2059) Resp:  [12-24] 20 (09/12 2059) BP: (93-160)/(50-109) 143/91 (09/12 2059) SpO2:  [93 %-100 %] 99 % (09/12 2059) Weight change:     Intake/Output from previous day: 09/11 0701 - 09/12 0700 In: 1000 [I.V.:1000] Out: -  Intake/Output this shift: No intake/output data recorded.  General appearance: alert, cooperative, appears stated age and no distress Neck: no adenopathy, no carotid bruit, no JVD, supple, symmetrical, trachea midline and thyroid not enlarged, symmetric, no tenderness/mass/nodules Back: symmetric, no curvature. ROM normal. No CVA tenderness. Resp: clear to auscultation bilaterally Cardio: regular rate and rhythm, S1, S2 normal, no murmur, click, rub or gallop GI: soft, non-tender; bowel sounds normal; no masses,  no organomegaly Extremities: extremities normal, atraumatic, no cyanosis or edema Pulses: 2+ and symmetric Neurologic: Grossly normal  Lab Results: Recent Labs    10/15/19 0752 10/16/19 0620  WBC 9.7 8.8  HGB 12.5 11.5*  HCT 36.1 33.2*  PLT 275 207   BMET Recent Labs    10/15/19 0752 10/16/19 0620  NA 140 139  K 4.7 3.8  CL 105 104  CO2 25 24  GLUCOSE 96 100*  BUN 9 9  CREATININE 0.76 0.54  CALCIUM 9.6 9.1    Studies/Results: No results found.  Medications: I have reviewed the patient's current medications.  Assessment/Plan: 38 year old female here with sickle cell painful crisis.  #1 sickle cell painful crisis: Patient has been placed on Dilaudid PCA with  Toradol and IV fluids.  Also on continue with her home OxyContin 20 mg every 12 hours.  At this point she is awaiting transfer upstairs.  We will continue with IV Dilaudid pushes 2 mg every 2 hours as needed until transferred upstairs.  #2 chronic pain syndrome: Continue with OxyContin.  #3 depression with anxiety: Continue home regimen.  #4 morbid obesity: Dietary counseling.  LOS: 1 day   Stephanie Burch,LAWAL 10/16/2019, 11:27 PM

## 2019-10-16 NOTE — ED Notes (Signed)
Pt moved to hospital bed for comfort. Pt assisted to restroom. PT ambulatory without assistance.

## 2019-10-16 NOTE — ED Notes (Signed)
Report given to Melissa, RN

## 2019-10-17 DIAGNOSIS — F419 Anxiety disorder, unspecified: Secondary | ICD-10-CM

## 2019-10-17 DIAGNOSIS — G894 Chronic pain syndrome: Secondary | ICD-10-CM | POA: Diagnosis not present

## 2019-10-17 DIAGNOSIS — D5700 Hb-SS disease with crisis, unspecified: Secondary | ICD-10-CM

## 2019-10-17 LAB — CBC WITH DIFFERENTIAL/PLATELET
Abs Immature Granulocytes: 0.05 10*3/uL (ref 0.00–0.07)
Basophils Absolute: 0 10*3/uL (ref 0.0–0.1)
Basophils Relative: 0 %
Eosinophils Absolute: 0.5 10*3/uL (ref 0.0–0.5)
Eosinophils Relative: 5 %
HCT: 29.9 % — ABNORMAL LOW (ref 36.0–46.0)
Hemoglobin: 10.3 g/dL — ABNORMAL LOW (ref 12.0–15.0)
Immature Granulocytes: 1 %
Lymphocytes Relative: 26 %
Lymphs Abs: 2.8 10*3/uL (ref 0.7–4.0)
MCH: 25.6 pg — ABNORMAL LOW (ref 26.0–34.0)
MCHC: 34.4 g/dL (ref 30.0–36.0)
MCV: 74.4 fL — ABNORMAL LOW (ref 80.0–100.0)
Monocytes Absolute: 0.8 10*3/uL (ref 0.1–1.0)
Monocytes Relative: 8 %
Neutro Abs: 6.6 10*3/uL (ref 1.7–7.7)
Neutrophils Relative %: 60 %
Platelets: 203 10*3/uL (ref 150–400)
RBC: 4.02 MIL/uL (ref 3.87–5.11)
RDW: 14 % (ref 11.5–15.5)
WBC: 10.7 10*3/uL — ABNORMAL HIGH (ref 4.0–10.5)
nRBC: 1.3 % — ABNORMAL HIGH (ref 0.0–0.2)

## 2019-10-17 LAB — COMPREHENSIVE METABOLIC PANEL
ALT: 26 U/L (ref 0–44)
AST: 26 U/L (ref 15–41)
Albumin: 3.5 g/dL (ref 3.5–5.0)
Alkaline Phosphatase: 69 U/L (ref 38–126)
Anion gap: 7 (ref 5–15)
BUN: 11 mg/dL (ref 6–20)
CO2: 28 mmol/L (ref 22–32)
Calcium: 8.8 mg/dL — ABNORMAL LOW (ref 8.9–10.3)
Chloride: 105 mmol/L (ref 98–111)
Creatinine, Ser: 0.63 mg/dL (ref 0.44–1.00)
GFR calc Af Amer: >60 mL/min (ref >60)
GFR calc non Af Amer: >60 mL/min (ref >60)
Glucose, Bld: 80 mg/dL (ref 70–99)
Potassium: 3.7 mmol/L (ref 3.5–5.1)
Sodium: 140 mmol/L (ref 135–145)
Total Bilirubin: 1 mg/dL (ref 0.3–1.2)
Total Protein: 6.4 g/dL — ABNORMAL LOW (ref 6.5–8.1)

## 2019-10-17 MED ORDER — OXYCODONE HCL 5 MG PO TABS
20.0000 mg | ORAL_TABLET | ORAL | Status: DC | PRN
  Administered 2019-10-17 – 2019-10-20 (×13): 20 mg via ORAL
  Filled 2019-10-17 (×14): qty 4

## 2019-10-17 MED ORDER — SODIUM CHLORIDE 0.9% FLUSH: 10.0000 mL | INTRAVENOUS | Status: DC | PRN

## 2019-10-17 MED ORDER — HYDROMORPHONE 1 MG/ML IV SOLN
INTRAVENOUS | Status: DC
  Administered 2019-10-17: 6.5 mg via INTRAVENOUS
  Administered 2019-10-17: 9 mg via INTRAVENOUS
  Administered 2019-10-17: 30 mg via INTRAVENOUS
  Administered 2019-10-17: 15 mg via INTRAVENOUS
  Administered 2019-10-18: 8.5 mg via INTRAVENOUS
  Administered 2019-10-18: 4.5 mg via INTRAVENOUS
  Administered 2019-10-18: 4 mg via INTRAVENOUS
  Administered 2019-10-18: 30 mg via INTRAVENOUS
  Administered 2019-10-18: 7 mg via INTRAVENOUS
  Administered 2019-10-18: 2.9 mg via INTRAVENOUS
  Administered 2019-10-18: 30 mg via INTRAVENOUS
  Administered 2019-10-19: 1 mg via INTRAVENOUS
  Administered 2019-10-19: 4.5 mg via INTRAVENOUS
  Filled 2019-10-17 (×3): qty 30

## 2019-10-17 MED ORDER — KETOROLAC TROMETHAMINE 30 MG/ML IJ SOLN
15.0000 mg | Freq: Four times a day (QID) | INTRAMUSCULAR | Status: AC
  Administered 2019-10-17 – 2019-10-20 (×12): 15 mg via INTRAVENOUS
  Filled 2019-10-17 (×12): qty 1

## 2019-10-17 NOTE — Progress Notes (Signed)
Subjective: Stephanie Burch is a 39 year old female with a medical history significant for sickle cell disease, chronic pain syndrome, opiate dependence and tolerance, and history of anemia of chronic disease that was admitted for sickle cell pain crisis.  Patient continues to complain of left arm and bilateral lower extremity pain.  Patient rates pain as 8/10.  She denies headache, dizziness, chest pain, urinary symptoms, nausea, vomiting, or diarrhea. Objective:  Vital signs in last 24 hours:  Vitals:   10/17/19 0546 10/17/19 0601 10/17/19 1032 10/17/19 1035  BP:  (!) 145/69 129/89   Pulse:  99 85   Resp:  17 16 15   Temp:  98.4 F (36.9 C) 98.1 F (36.7 C)   TempSrc:  Oral Oral   SpO2:  99% 99% 100%  Weight: 134.9 kg 134.8 kg    Height:        Intake/Output from previous day:   Intake/Output Summary (Last 24 hours) at 10/17/2019 1045 Last data filed at 10/17/2019 0433 Gross per 24 hour  Intake 1386.5 ml  Output --  Net 1386.5 ml    Physical Exam: General: Alert, awake, oriented x3, in no acute distress.  HEENT: [place]/AT PEERL, EOMI Neck: Trachea midline,  no masses, no thyromegal,y no JVD, no carotid bruit OROPHARYNX:  Moist, No exudate/ erythema/lesions.  Heart: Regular rate and rhythm, without murmurs, rubs, gallops, PMI non-displaced, no heaves or thrills on palpation.  Lungs: Clear to auscultation, no wheezing or rhonchi noted. No increased vocal fremitus resonant to percussion  Abdomen: Soft, nontender, nondistended, positive bowel sounds, no masses no hepatosplenomegaly noted..  Neuro: No focal neurological deficits noted cranial nerves II through XII grossly intact. DTRs 2+ bilaterally upper and lower extremities. Strength 5 out of 5 in bilateral upper and lower extremities. Musculoskeletal: No warm swelling or erythema around joints, no spinal tenderness noted. Psychiatric: Patient alert and oriented x3, good insight and cognition, good recent to remote recall. Lymph  node survey: No cervical axillary or inguinal lymphadenopathy noted.  Lab Results:  Basic Metabolic Panel:    Component Value Date/Time   NA 140 10/17/2019 0540   NA 142 09/11/2017 1431   K 3.7 10/17/2019 0540   CL 105 10/17/2019 0540   CO2 28 10/17/2019 0540   BUN 11 10/17/2019 0540   BUN 8 09/11/2017 1431   CREATININE 0.63 10/17/2019 0540   GLUCOSE 80 10/17/2019 0540   CALCIUM 8.8 (L) 10/17/2019 0540   CBC:    Component Value Date/Time   WBC 10.7 (H) 10/17/2019 0540   HGB 10.3 (L) 10/17/2019 0540   HGB 13.0 09/11/2017 1431   HCT 29.9 (L) 10/17/2019 0540   HCT 38.4 09/11/2017 1431   PLT 203 10/17/2019 0540   PLT 258 09/11/2017 1431   MCV 74.4 (L) 10/17/2019 0540   MCV 75 (L) 09/11/2017 1431   NEUTROABS 6.6 10/17/2019 0540   NEUTROABS 3.9 09/11/2017 1431   LYMPHSABS 2.8 10/17/2019 0540   LYMPHSABS 2.2 09/11/2017 1431   MONOABS 0.8 10/17/2019 0540   EOSABS 0.5 10/17/2019 0540   EOSABS 0.1 09/11/2017 1431   BASOSABS 0.0 10/17/2019 0540   BASOSABS 0.0 09/11/2017 1431    Recent Results (from the past 240 hour(s))  SARS Coronavirus 2 by RT PCR (hospital order, performed in [facility] hospital lab) Nasopharyngeal Nasopharyngeal Swab     Status: None   Collection Time: 10/15/19 11:31 AM   Specimen: Nasopharyngeal Swab  Result Value Ref Range Status   SARS Coronavirus 2 NEGATIVE NEGATIVE Final  Comment: (NOTE) SARS-CoV-2 target nucleic acids are NOT DETECTED.  The SARS-CoV-2 RNA is generally detectable in upper and lower respiratory specimens during the acute phase of infection. The lowest concentration of SARS-CoV-2 viral copies this assay can detect is 250 copies / mL. A negative result does not preclude SARS-CoV-2 infection and should not be used as the sole basis for treatment or other patient management decisions.  A negative result may occur with improper specimen collection / handling, submission of specimen other than nasopharyngeal swab, presence of viral  mutation(s) within the areas targeted by this assay, and inadequate number of viral copies (<250 copies / mL). A negative result must be combined with clinical observations, patient history, and epidemiological information.  Fact Sheet for Patients:   BoilerBrush.com.cy  Fact Sheet for Healthcare Providers: https://pope.com/  This test is not yet approved or  cleared by the [place] FDA and has been authorized for detection and/or diagnosis of SARS-CoV-2 by FDA under an Emergency Use Authorization (EUA).  This EUA will remain in effect (meaning this test can be used) for the duration of the COVID-19 declaration under Section 564(b)(1) of the Act, 21 U.S.C. section 360bbb-3(b)(1), unless the authorization is terminated or revoked sooner.  Performed at [facility], [address], [place], [place] [address]     Studies/Results: No results found.  Medications: Scheduled Meds: . enoxaparin (LOVENOX) injection  65 mg Subcutaneous Q24H  . folic acid  1 mg Oral Daily  . HYDROmorphone   Intravenous Q4H  . ketorolac  15 mg Intravenous Q6H  . oxyCODONE  20 mg Oral Q12H  . senna-docusate  1 tablet Oral BID  . Vitamin D (Ergocalciferol)  50,000 Units Oral Q7 days  . voxelotor  1,500 mg Oral Daily   Continuous Infusions: . sodium chloride 10 mL/hr at 10/17/19 1032  . diphenhydrAMINE     PRN Meds:.diphenhydrAMINE **OR** diphenhydrAMINE, naloxone **AND** sodium chloride flush, ondansetron (ZOFRAN) IV, oxyCODONE, polyethylene glycol, tiZANidine  Consultants:  None  Procedures:  None  Antibiotics:  None  Assessment/Plan: Principal Problem:   Sickle cell pain crisis (HCC) Active Problems:   Sickle cell anemia with crisis (HCC)   Chronic pain syndrome   Anxiety  Sickle cell disease with pain crisis: Continue IV fluids at [facility].  Weaning IV Dilaudid PCA, settings decreased to 0.5 mg, 10-minute lockout,  and 2.5 mg/h. Oxycodone 20 mg every 4 hours as needed for severe breakthrough pain Toradol 15 mg IV every 6 hours for total of 5 days Monitor vital signs closely, reevaluate pain scale regularly, and supplemental oxygen as needed.  Chronic pain syndrome: Continue home medications  Sickle cell anemia: Hemoglobin is stable and consistent with patient's baseline.  There is no clinical indication for blood transfusion at this time.  Continue to monitor closely.  History of depression and anxiety: Stable.  Continue home medications.  Patient denies any suicidal or homicidal ideations.  Morbid obesity: Continue heart healthy diet.  Counseled at length.   Code Status: Full Code Family Communication: N/A Disposition Plan: Not yet ready for discharge    Loring Liskey Rennis Petty  APRN, MSN, FNP-C Patient Care Center [facility] [address] [place], [place] [address] [phone]  If 5PM-8AM, please contact night-coverage.  10/17/2019, 10:45 AM  LOS: 2 days

## 2019-10-18 DIAGNOSIS — F419 Anxiety disorder, unspecified: Secondary | ICD-10-CM | POA: Diagnosis not present

## 2019-10-18 DIAGNOSIS — D5700 Hb-SS disease with crisis, unspecified: Secondary | ICD-10-CM | POA: Diagnosis not present

## 2019-10-18 DIAGNOSIS — G894 Chronic pain syndrome: Secondary | ICD-10-CM | POA: Diagnosis not present

## 2019-10-18 NOTE — Progress Notes (Signed)
Subjective: Stephanie Burch is a 39 year old female with a medical history significant for sickle cell disease, chronic pain syndrome, dependence and tolerance, and history of anemia of chronic disease that was admitted for sickle cell pain crisis.  Patient states the pain intensity has not improved very much overnight.  Pain continues to be primarily to left upper and bilateral lower extremities.  Pain persists despite IV Dilaudid PCA.  Denies any headache, dizziness, chest pain, shortness of breath, urinary symptoms, nausea, vomiting, or diarrhea.  Objective:  Vital signs in last 24 hours:  Vitals:   10/18/19 0010 10/18/19 0415 10/18/19 0555 10/18/19 0824  BP: 131/78 (!) 149/73    Pulse: 99 100    Resp: 18 16 20 14   Temp: 98.9 F (37.2 C) 98.9 F (37.2 C)    TempSrc: Oral Oral    SpO2: 98% 97% 99% 100%  Weight:      Height:        Intake/Output from previous day:   Intake/Output Summary (Last 24 hours) at 10/18/2019 0834 Last data filed at 10/17/2019 2318 Gross per 24 hour  Intake 720 ml  Output --  Net 720 ml    Physical Exam: General: Alert, awake, oriented x3, in no acute distress.  HEENT: [place]/AT PEERL, EOMI Neck: Trachea midline,  no masses, no thyromegal,y no JVD, no carotid bruit OROPHARYNX:  Moist, No exudate/ erythema/lesions.  Heart: Regular rate and rhythm, without murmurs, rubs, gallops, PMI non-displaced, no heaves or thrills on palpation.  Lungs: Clear to auscultation, no wheezing or rhonchi noted. No increased vocal fremitus resonant to percussion  Abdomen: Soft, nontender, nondistended, positive bowel sounds, no masses no hepatosplenomegaly noted..  Neuro: No focal neurological deficits noted cranial nerves II through XII grossly intact. DTRs 2+ bilaterally upper and lower extremities. Strength 5 out of 5 in bilateral upper and lower extremities. Musculoskeletal: No warm swelling or erythema around joints, no spinal tenderness noted. Psychiatric: Patient alert  and oriented x3, good insight and cognition, good recent to remote recall. Lymph node survey: No cervical axillary or inguinal lymphadenopathy noted.  Lab Results:  Basic Metabolic Panel:    Component Value Date/Time   NA 140 10/17/2019 0540   NA 142 09/11/2017 1431   K 3.7 10/17/2019 0540   CL 105 10/17/2019 0540   CO2 28 10/17/2019 0540   BUN 11 10/17/2019 0540   BUN 8 09/11/2017 1431   CREATININE 0.63 10/17/2019 0540   GLUCOSE 80 10/17/2019 0540   CALCIUM 8.8 (L) 10/17/2019 0540   CBC:    Component Value Date/Time   WBC 10.7 (H) 10/17/2019 0540   HGB 10.3 (L) 10/17/2019 0540   HGB 13.0 09/11/2017 1431   HCT 29.9 (L) 10/17/2019 0540   HCT 38.4 09/11/2017 1431   PLT 203 10/17/2019 0540   PLT 258 09/11/2017 1431   MCV 74.4 (L) 10/17/2019 0540   MCV 75 (L) 09/11/2017 1431   NEUTROABS 6.6 10/17/2019 0540   NEUTROABS 3.9 09/11/2017 1431   LYMPHSABS 2.8 10/17/2019 0540   LYMPHSABS 2.2 09/11/2017 1431   MONOABS 0.8 10/17/2019 0540   EOSABS 0.5 10/17/2019 0540   EOSABS 0.1 09/11/2017 1431   BASOSABS 0.0 10/17/2019 0540   BASOSABS 0.0 09/11/2017 1431    Recent Results (from the past 240 hour(s))  SARS Coronavirus 2 by RT PCR (hospital order, performed in [facility] hospital lab) Nasopharyngeal Nasopharyngeal Swab     Status: None   Collection Time: 10/15/19 11:31 AM   Specimen: Nasopharyngeal Swab  Result Value Ref Range Status   SARS Coronavirus 2 NEGATIVE NEGATIVE Final    Comment: (NOTE) SARS-CoV-2 target nucleic acids are NOT DETECTED.  The SARS-CoV-2 RNA is generally detectable in upper and lower respiratory specimens during the acute phase of infection. The lowest concentration of SARS-CoV-2 viral copies this assay can detect is 250 copies / mL. A negative result does not preclude SARS-CoV-2 infection and should not be used as the sole basis for treatment or other patient management decisions.  A negative result may occur with improper specimen collection /  handling, submission of specimen other than nasopharyngeal swab, presence of viral mutation(s) within the areas targeted by this assay, and inadequate number of viral copies (<250 copies / mL). A negative result must be combined with clinical observations, patient history, and epidemiological information.  Fact Sheet for Patients:   BoilerBrush.com.cy  Fact Sheet for Healthcare Providers: https://pope.com/  This test is not yet approved or  cleared by the [place] FDA and has been authorized for detection and/or diagnosis of SARS-CoV-2 by FDA under an Emergency Use Authorization (EUA).  This EUA will remain in effect (meaning this test can be used) for the duration of the COVID-19 declaration under Section 564(b)(1) of the Act, 21 U.S.C. section 360bbb-3(b)(1), unless the authorization is terminated or revoked sooner.  Performed at [facility], [address], [place], [place] [address]     Studies/Results: No results found.  Medications: Scheduled Meds: . enoxaparin (LOVENOX) injection  65 mg Subcutaneous Q24H  . folic acid  1 mg Oral Daily  . HYDROmorphone   Intravenous Q4H  . ketorolac  15 mg Intravenous Q6H  . oxyCODONE  20 mg Oral Q12H  . senna-docusate  1 tablet Oral BID  . Vitamin D (Ergocalciferol)  50,000 Units Oral Q7 days  . voxelotor  1,500 mg Oral Daily   Continuous Infusions: . sodium chloride 10 mL/hr at 10/17/19 1032  . diphenhydrAMINE     PRN Meds:.diphenhydrAMINE **OR** diphenhydrAMINE, naloxone **AND** sodium chloride flush, ondansetron (ZOFRAN) IV, oxyCODONE, polyethylene glycol, sodium chloride flush, tiZANidine  Consultants:  None  Procedures:  None  Antibiotics:  None  Assessment/Plan: Principal Problem:   Sickle cell pain crisis (HCC) Active Problems:   Sickle cell anemia with crisis (HCC)   Chronic pain syndrome   Anxiety  Sickle cell disease with pain  crisis: Continue IV fluids at [facility]. Continue IV Dilaudid PCA, no changes in settings on today. Oxycodone 20 mg every 4 hours as needed for severe breakthrough pain Toradol 15 mg IV every 6 hours with total of 5 days. Monitor vital signs closely, reevaluate pain scale regularly, supplemental needed.  Medications  Sickle cell anemia: Hemoglobin is stable and consistent with patient's baseline.  Indication for blood transfusion at this time.  Continue to monitor closely.  History of depression and anxiety: Stable.  Continue home medications.  Morbid obesity: Heart healthy diet.  Patient counseled at length.  Code Status: Full Code Family Communication: N/A Disposition Plan: Not yet ready for discharge  Jakobe Blau Rennis Petty  APRN, MSN, FNP-C Patient Care Center [facility] [address] [place], [place] [address] [phone]  If 5PM-8AM, please contact night-coverage.  10/18/2019, 8:34 AM  LOS: 3 days

## 2019-10-19 DIAGNOSIS — F419 Anxiety disorder, unspecified: Secondary | ICD-10-CM | POA: Diagnosis not present

## 2019-10-19 DIAGNOSIS — D5700 Hb-SS disease with crisis, unspecified: Secondary | ICD-10-CM | POA: Diagnosis not present

## 2019-10-19 DIAGNOSIS — G894 Chronic pain syndrome: Secondary | ICD-10-CM | POA: Diagnosis not present

## 2019-10-19 LAB — CBC
HCT: 28.6 % — ABNORMAL LOW (ref 36.0–46.0)
Hemoglobin: 10.4 g/dL — ABNORMAL LOW (ref 12.0–15.0)
MCH: 25.5 pg — ABNORMAL LOW (ref 26.0–34.0)
MCHC: 36.4 g/dL — ABNORMAL HIGH (ref 30.0–36.0)
MCV: 70.1 fL — ABNORMAL LOW (ref 80.0–100.0)
Platelets: 143 10*3/uL — ABNORMAL LOW (ref 150–400)
RBC: 4.08 MIL/uL (ref 3.87–5.11)
RDW: 13.9 % (ref 11.5–15.5)
WBC: 10.6 10*3/uL — ABNORMAL HIGH (ref 4.0–10.5)
nRBC: 1.1 % — ABNORMAL HIGH (ref 0.0–0.2)

## 2019-10-19 MED ORDER — HYDROMORPHONE 1 MG/ML IV SOLN
INTRAVENOUS | Status: DC
  Administered 2019-10-19: 1 mg via INTRAVENOUS
  Administered 2019-10-19: 10 mg via INTRAVENOUS
  Administered 2019-10-19: 3.5 mg via INTRAVENOUS
  Administered 2019-10-20: 5 mL via INTRAVENOUS
  Administered 2019-10-20: 3 mg via INTRAVENOUS
  Administered 2019-10-20: 6 mg via INTRAVENOUS
  Administered 2019-10-20: 30 mg via INTRAVENOUS
  Filled 2019-10-19: qty 30

## 2019-10-19 MED ORDER — PHENOL 1.4 % MT LIQD
1.0000 | OROMUCOSAL | Status: DC | PRN
  Administered 2019-10-19: 1 via OROMUCOSAL
  Filled 2019-10-19: qty 177

## 2019-10-19 MED ORDER — GUAIFENESIN-DM 100-10 MG/5ML PO SYRP
5.0000 mL | ORAL_SOLUTION | ORAL | Status: DC | PRN
  Administered 2019-10-19: 5 mL via ORAL
  Filled 2019-10-19: qty 10

## 2019-10-19 MED ORDER — ACETAMINOPHEN 325 MG PO TABS
650.0000 mg | ORAL_TABLET | Freq: Four times a day (QID) | ORAL | Status: DC | PRN
  Administered 2019-10-19: 650 mg via ORAL
  Filled 2019-10-19: qty 2

## 2019-10-19 NOTE — Progress Notes (Signed)
Subjective: Stephanie Burch is a 39 year old female with a medical history significant for sickle cell disease, chronic pain syndrome, opiate dependence and tolerance, and history of anemia of chronic disease admitted for sickle cell pain crisis.  Patient states that pain has improved some overnight.  She rates pain at 10/10 primarily to left arm and bilateral lower extremities.  She denies any headache, chest pain, shortness of breath, urinary symptoms, nausea, vomiting, or diarrhea.  Patient has been ambulating in halls without assistance.  Objective:  Vital signs in last 24 hours:  Vitals:   10/19/19 1325 10/19/19 1609 10/19/19 1630 10/19/19 1812  BP:   97/62 124/70  Pulse:   96 89  Resp: 17 16 18 16   Temp:   99.1 F (37.3 C) 98.4 F (36.9 C)  TempSrc:   Oral Oral  SpO2: 100% 99%  95%  Weight:      Height:        Intake/Output from previous day:   Intake/Output Summary (Last 24 hours) at 10/19/2019 1842 Last data filed at 10/19/2019 1030 Gross per 24 hour  Intake 120 ml  Output --  Net 120 ml    Physical Exam: General: Alert, awake, oriented x3, in no acute distress.  HEENT: /AT [place]ERL, EOMI Neck: Trachea midline,  no masses, no thyromegal,y no JVD, no carotid bruit OROPHARYNX:  Moist, No exudate/ erythema/lesions.  Heart: Regular rate and rhythm, without murmurs, rubs, gallops, PMI non-displaced, no heaves or thrills on palpation.  Lungs: Clear to auscultation, no wheezing or rhonchi noted. No increased vocal fremitus resonant to percussion  Abdomen: Soft, nontender, nondistended, positive bowel sounds, no masses no hepatosplenomegaly noted..  Neuro: No focal neurological deficits noted cranial nerves II through XII grossly intact. DTRs 2+ bilaterally upper and lower extremities. Strength 5 out of 5 in bilateral upper and lower extremities. Musculoskeletal: No warm swelling or erythema around joints, no spinal tenderness noted. Psychiatric: Patient alert and oriented x3,  good insight and cognition, good recent to remote recall. Lymph node survey: No cervical axillary or inguinal lymphadenopathy noted.  Lab Results:  Basic Metabolic Panel:    Component Value Date/Time   NA 140 10/17/2019 0540   NA 142 09/11/2017 1431   K 3.7 10/17/2019 0540   CL 105 10/17/2019 0540   CO2 28 10/17/2019 0540   BUN 11 10/17/2019 0540   BUN 8 09/11/2017 1431   CREATININE 0.63 10/17/2019 0540   GLUCOSE 80 10/17/2019 0540   CALCIUM 8.8 (L) 10/17/2019 0540   CBC:    Component Value Date/Time   WBC 10.6 (H) 10/19/2019 0909   HGB 10.4 (L) 10/19/2019 0909   HGB 13.0 09/11/2017 1431   HCT 28.6 (L) 10/19/2019 0909   HCT 38.4 09/11/2017 1431   PLT 143 (L) 10/19/2019 0909   PLT 258 09/11/2017 1431   MCV 70.1 (L) 10/19/2019 0909   MCV 75 (L) 09/11/2017 1431   NEUTROABS 6.6 10/17/2019 0540   NEUTROABS 3.9 09/11/2017 1431   LYMPHSABS 2.8 10/17/2019 0540   LYMPHSABS 2.2 09/11/2017 1431   MONOABS 0.8 10/17/2019 0540   EOSABS 0.5 10/17/2019 0540   EOSABS 0.1 09/11/2017 1431   BASOSABS 0.0 10/17/2019 0540   BASOSABS 0.0 09/11/2017 1431    Recent Results (from the past 240 hour(s))  SARS Coronavirus 2 by RT PCR (hospital order, performed in [facility] hospital lab) Nasopharyngeal Nasopharyngeal Swab     Status: None   Collection Time: 10/15/19 11:31 AM   Specimen: Nasopharyngeal Swab  Result  Value Ref Range Status   SARS Coronavirus 2 NEGATIVE NEGATIVE Final    Comment: (NOTE) SARS-CoV-2 target nucleic acids are NOT DETECTED.  The SARS-CoV-2 RNA is generally detectable in upper and lower respiratory specimens during the acute phase of infection. The lowest concentration of SARS-CoV-2 viral copies this assay can detect is 250 copies / mL. A negative result does not preclude SARS-CoV-2 infection and should not be used as the sole basis for treatment or other patient management decisions.  A negative result may occur with improper specimen collection / handling,  submission of specimen other than nasopharyngeal swab, presence of viral mutation(s) within the areas targeted by this assay, and inadequate number of viral copies (<250 copies / mL). A negative result must be combined with clinical observations, patient history, and epidemiological information.  Fact Sheet for Patients:   BoilerBrush.com.cy  Fact Sheet for Healthcare Providers: https://pope.com/  This test is not yet approved or  cleared by the [place] FDA and has been authorized for detection and/or diagnosis of SARS-CoV-2 by FDA under an Emergency Use Authorization (EUA).  This EUA will remain in effect (meaning this test can be used) for the duration of the COVID-19 declaration under Section 564(b)(1) of the Act, 21 U.S.C. section 360bbb-3(b)(1), unless the authorization is terminated or revoked sooner.  Performed at [facility], [address] [place], [place] [address]     Studies/Results: No results found.  Medications: Scheduled Meds: . enoxaparin (LOVENOX) injection  65 mg Subcutaneous Q24H  . folic acid  1 mg Oral Daily  . HYDROmorphone   Intravenous Q4H  . ketorolac  15 mg Intravenous Q6H  . oxyCODONE  20 mg Oral Q12H  . senna-docusate  1 tablet Oral BID  . Vitamin D (Ergocalciferol)  50,000 Units Oral Q7 days  . voxelotor  1,500 mg Oral Daily   Continuous Infusions: . sodium chloride 10 mL/hr at 10/17/19 1032  . diphenhydrAMINE     PRN Meds:.acetaminophen, diphenhydrAMINE **OR** diphenhydrAMINE, guaiFENesin-dextromethorphan, naloxone **AND** sodium chloride flush, ondansetron (ZOFRAN) IV, oxyCODONE, phenol, polyethylene glycol, sodium chloride flush, tiZANidine  Consultants:  None  Procedures:  None  Antibiotics:  None  Assessment/Plan: Principal Problem:   Sickle cell pain crisis (HCC) Active Problems:   Sickle cell anemia with crisis (HCC)   Chronic pain syndrome    Anxiety   Sickle cell disease with pain crisis: Weaning IV Dilaudid PCA Oxycodone 20 mg every 4 hours as needed for severe breakthrough pain Toradol 15 mg IV every 6 hours for a total of 5 days Monitor vital signs closely, reevaluate pain scale regularly, and supplemental oxygen as needed.  Sickle cell anemia: Hemoglobin is stable and consistent with patient's baseline.  No indication for blood transfusion at this time.  Continue to monitor closely.  History of depression and anxiety: Stable.  Continue home medications. Morbid obesity: Continue heart healthy diet.  Code Status: Full Code Family Communication: N/A Disposition Plan: Not yet ready for discharge.  Possible discharge on 10/20/2019  Nolon Nations  APRN, MSN, FNP-C Patient Care [facility] [address] [place], [place] [address] [phone]  If 5PM-8AM, please contact night-coverage.  10/19/2019, 6:42 PM  LOS: 4 days

## 2019-10-20 ENCOUNTER — Other Ambulatory Visit: Payer: Self-pay | Admitting: Internal Medicine

## 2019-10-20 ENCOUNTER — Encounter: Payer: Self-pay | Admitting: Family Medicine

## 2019-10-20 ENCOUNTER — Other Ambulatory Visit: Payer: Self-pay | Admitting: Family Medicine

## 2019-10-20 DIAGNOSIS — F419 Anxiety disorder, unspecified: Secondary | ICD-10-CM | POA: Diagnosis not present

## 2019-10-20 DIAGNOSIS — D571 Sickle-cell disease without crisis: Secondary | ICD-10-CM

## 2019-10-20 DIAGNOSIS — F1190 Opioid use, unspecified, uncomplicated: Secondary | ICD-10-CM

## 2019-10-20 DIAGNOSIS — B009 Herpesviral infection, unspecified: Secondary | ICD-10-CM

## 2019-10-20 DIAGNOSIS — G894 Chronic pain syndrome: Secondary | ICD-10-CM | POA: Diagnosis not present

## 2019-10-20 DIAGNOSIS — D5700 Hb-SS disease with crisis, unspecified: Secondary | ICD-10-CM | POA: Diagnosis not present

## 2019-10-20 LAB — GROUP A STREP BY PCR: Group A Strep by PCR: NOT DETECTED

## 2019-10-20 LAB — RESP PANEL BY RT PCR (RSV, FLU A&B, COVID)
Influenza A by PCR: NEGATIVE
Influenza B by PCR: NEGATIVE
Respiratory Syncytial Virus by PCR: NEGATIVE
SARS Coronavirus 2 by RT PCR: POSITIVE — AB

## 2019-10-20 MED ORDER — OXYCODONE HCL 20 MG PO TABS: 20.0000 mg | ORAL_TABLET | ORAL | 0 refills | Status: DC | PRN

## 2019-10-20 MED ORDER — VALACYCLOVIR HCL 500 MG PO TABS: ORAL_TABLET | ORAL | 11 refills | Status: DC

## 2019-10-20 MED ORDER — MENTHOL 3 MG MT LOZG
1.0000 | LOZENGE | OROMUCOSAL | Status: DC | PRN
  Administered 2019-10-20 (×2): 3 mg via ORAL
  Filled 2019-10-20: qty 9

## 2019-10-20 NOTE — Plan of Care (Signed)
Problem: Education: Goal: Knowledge of vaso-occlusive preventative measures will improve 10/20/2019 1159 by Minette Brine, RN Outcome: Progressing 10/20/2019 1157 by Minette Brine, RN Outcome: Progressing Goal: Awareness of infection prevention will improve 10/20/2019 1159 by Minette Brine, RN Outcome: Progressing 10/20/2019 1157 by Minette Brine, RN Outcome: Progressing Goal: Awareness of signs and symptoms of anemia will improve 10/20/2019 1159 by Minette Brine, RN Outcome: Progressing 10/20/2019 1157 by Minette Brine, RN Outcome: Progressing Goal: Long-term complications will improve 10/20/2019 1159 by Minette Brine, RN Outcome: Progressing 10/20/2019 1157 by Minette Brine, RN Outcome: Progressing   Problem: Self-Care: Goal: Ability to incorporate actions that prevent/reduce pain crisis will improve 10/20/2019 1159 by Minette Brine, RN Outcome: Progressing 10/20/2019 1157 by Minette Brine, RN Outcome: Progressing   Problem: Bowel/Gastric: Goal: Gut motility will be maintained 10/20/2019 1159 by Marlet Korte, Petra Kuba, RN Outcome: Progressing 10/20/2019 1157 by Minette Brine, RN Outcome: Progressing   Problem: Tissue Perfusion: Goal: Complications related to inadequate tissue perfusion will be avoided or minimized 10/20/2019 1159 by Minette Brine, RN Outcome: Progressing 10/20/2019 1157 by Minette Brine, RN Outcome: Progressing   Problem: Respiratory: Goal: Pulmonary complications will be avoided or minimized 10/20/2019 1159 by Minette Brine, RN Outcome: Progressing 10/20/2019 1157 by Minette Brine, RN Outcome: Progressing Goal: Acute Chest Syndrome will be identified early to prevent complications 10/20/2019 1159 by Minette Brine, RN Outcome: Progressing 10/20/2019 1157 by Minette Brine, RN Outcome: Progressing   Problem: Fluid Volume: Goal: Ability to maintain a balanced intake and output will  improve 10/20/2019 1159 by Minette Brine, RN Outcome: Progressing 10/20/2019 1157 by Minette Brine, RN Outcome: Progressing   Problem: Sensory: Goal: Pain level will decrease with appropriate interventions 10/20/2019 1159 by Minette Brine, RN Outcome: Progressing 10/20/2019 1157 by Minette Brine, RN Outcome: Progressing   Problem: Health Behavior: Goal: Postive changes in compliance with treatment and prescription regimens will improve 10/20/2019 1159 by Minette Brine, RN Outcome: Progressing 10/20/2019 1157 by Minette Brine, RN Outcome: Progressing   Problem: Education: Goal: Knowledge of General Education information will improve Description: Including pain rating scale, medication(s)/side effects and non-pharmacologic comfort measures 10/20/2019 1159 by Minette Brine, RN Outcome: Progressing 10/20/2019 1157 by Minette Brine, RN Outcome: Progressing   Problem: Health Behavior/Discharge Planning: Goal: Ability to manage health-related needs will improve 10/20/2019 1159 by Syesha Thaw, Petra Kuba, RN Outcome: Progressing 10/20/2019 1157 by Minette Brine, RN Outcome: Progressing   Problem: Clinical Measurements: Goal: Ability to maintain clinical measurements within normal limits will improve 10/20/2019 1159 by Minette Brine, RN Outcome: Progressing 10/20/2019 1157 by Minette Brine, RN Outcome: Progressing Goal: Will remain free from infection 10/20/2019 1159 by Minette Brine, RN Outcome: Progressing 10/20/2019 1157 by Minette Brine, RN Outcome: Progressing Goal: Diagnostic test results will improve 10/20/2019 1159 by Minette Brine, RN Outcome: Progressing 10/20/2019 1157 by Minette Brine, RN Outcome: Progressing Goal: Respiratory complications will improve 10/20/2019 1159 by Minette Brine, RN Outcome: Progressing 10/20/2019 1157 by Minette Brine, RN Outcome: Progressing Goal: Cardiovascular complication will be  avoided 10/20/2019 1159 by Minette Brine, RN Outcome: Progressing 10/20/2019 1157 by Minette Brine, RN Outcome: Progressing   Problem: Activity: Goal: Risk for activity intolerance will decrease 10/20/2019 1159 by Minette Brine, RN Outcome: Progressing 10/20/2019 1157 by Minette Brine, RN Outcome: Progressing   Problem: Nutrition: Goal: Adequate nutrition will be  maintained 10/20/2019 1159 by Minette Brine, RN Outcome: Progressing 10/20/2019 1157 by Minette Brine, RN Outcome: Progressing   Problem: Coping: Goal: Level of anxiety will decrease 10/20/2019 1159 by Minette Brine, RN Outcome: Progressing 10/20/2019 1157 by Minette Brine, RN Outcome: Progressing   Problem: Elimination: Goal: Will not experience complications related to bowel motility 10/20/2019 1159 by Minette Brine, RN Outcome: Progressing 10/20/2019 1157 by Minette Brine, RN Outcome: Progressing Goal: Will not experience complications related to urinary retention 10/20/2019 1159 by Minette Brine, RN Outcome: Progressing 10/20/2019 1157 by Minette Brine, RN Outcome: Progressing   Problem: Pain Managment: Goal: General experience of comfort will improve 10/20/2019 1159 by Minette Brine, RN Outcome: Progressing 10/20/2019 1157 by Minette Brine, RN Outcome: Progressing   Problem: Safety: Goal: Ability to remain free from injury will improve 10/20/2019 1159 by Minette Brine, RN Outcome: Progressing 10/20/2019 1157 by Minette Brine, RN Outcome: Progressing   Problem: Skin Integrity: Goal: Risk for impaired skin integrity will decrease 10/20/2019 1159 by Minette Brine, RN Outcome: Progressing 10/20/2019 1157 by Minette Brine, RN Outcome: Progressing

## 2019-10-20 NOTE — Progress Notes (Addendum)
CSW received a call from pt's RN, pt is COVID positive and needs transport.  CSW called [facility] dispatcher directly at ph: [phone] and requested [facility] and stated pt was ambulatory but COVID positive.  Pt is number 4 in line, per [facility]  .7:14 PM Unit secretary updated.  7:48 PM RN states pt has found a ride home/[facility] is not needed.  CSW cancelled [facility].  CSW will continue to follow for D/C needs.  Dorothe Pea. Haillee Johann  MSW, LCSW, LCAS, CCS Transitions of Care Clinical Social Worker Care Coordination Department Ph: [phone]

## 2019-10-20 NOTE — Progress Notes (Signed)
Patient is stable and pain has decreased. Pain is controlled, patient is to be dc this evening home. Denies chest pain and shortness of breath. Call bell within reach. On room air 96%

## 2019-10-20 NOTE — Discharge Instructions (Signed)
Chronic Pain, Adult Chronic pain is a type of pain that lasts or keeps coming back (recurs) for at least six months. You may have chronic headaches, abdominal pain, or body pain. Chronic pain may be related to an illness, such as fibromyalgia or complex regional pain syndrome. Sometimes the cause of chronic pain is not known. Chronic pain can make it hard for you to do daily activities. If not treated, chronic pain can lead to other health problems, including anxiety and depression. Treatment depends on the cause and severity of your pain. You may need to work with a pain specialist to come up with a treatment plan. The plan may include medicine, counseling, and physical therapy. Many people benefit from a combination of two or more types of treatment to control their pain. Follow these instructions at home: Lifestyle  Consider keeping a pain diary to share with your health care providers.  Consider talking with a mental health care provider (psychologist) about how to cope with chronic pain.  Consider joining a chronic pain support group.  Try to control or lower your stress levels. Talk to your health care provider about strategies to do this. General instructions   Take over-the-counter and prescription medicines only as told by your health care provider.  Follow your treatment plan as told by your health care provider. This may include: ? Gentle, regular exercise. ? Eating a healthy diet that includes foods such as vegetables, fruits, fish, and lean meats. ? Cognitive or behavioral therapy. ? Working with a Adult nurse. ? Meditation or yoga. ? Acupuncture or massage therapy. ? Aroma, color, light, or sound therapy. ? Local electrical stimulation. ? Shots (injections) of numbing or pain-relieving medicines into the spine or the area of pain.  Check your pain level as told by your health care provider. Ask your health care provider if you should use a pain scale.  Learn as  much as you can about how to manage your chronic pain. Ask your health care provider if an intensive pain rehabilitation program or a chronic pain specialist would be helpful.  Keep all follow-up visits as told by your health care provider. This is important. Contact a health care provider if:  Your pain gets worse.  You have new pain.  You have trouble sleeping.  You have trouble doing your normal activities.  Your pain is not controlled with treatment.  Your have side effects from pain medicine.  You feel weak. Get help right away if:  You lose feeling or have numbness in your body.  You lose control of bowel or bladder function.  Your pain suddenly gets much worse.  You develop shaking or chills.  You develop confusion.  You develop chest pain.  You have trouble breathing or shortness of breath.  You pass out.  You have thoughts about hurting yourself or others. This information is not intended to replace advice given to you by your health care provider. Make sure you discuss any questions you have with your health care provider. Document Revised: 01/02/2017 Document Reviewed: 07/10/2015 Elsevier Patient Education  2020 [employer]   Sickle Cell Anemia, Adult  Sickle cell anemia is a condition in which red blood cells have an abnormal "sickle" shape. Red blood cells carry oxygen through the body. Sickle-shaped red blood cells do not live as long as normal red blood cells. They also clump together and block blood from flowing through the blood vessels. This condition prevents the body from getting enough oxygen. Sickle  cell anemia causes organ damage and pain. It also increases the risk of infection. What are the causes? This condition is caused by a gene that is passed from parent to child (inherited). Receiving two copies of the gene causes the disease. Receiving one copy causes the "trait," which means that symptoms are milder or not present. What increases the  risk? This condition is more likely to develop if your ancestors were from [place], the [place], [place] or [place], the [place], [place], or the [place]. What are the signs or symptoms? Symptoms of this condition include:  Episodes of pain (crises), especially in the hands and feet, joints, back, chest, or abdomen. The pain can be triggered by: ? An illness, especially if there is dehydration. ? Doing an activity with great effort (overexertion). ? Exposure to extreme temperature changes. ? High altitude.  Fatigue.  Shortness of breath or difficulty breathing.  Dizziness.  Pale skin or yellowed skin (jaundice).  Frequent bacterial infections.  Pain and swelling in the hands and feet (hand-food syndrome).  Prolonged, painful erection of the penis (priapism).  Acute chest syndrome. Symptoms of this include: ? Chest pain. ? Fever. ? Cough. ? Fast breathing.  Stroke.  Decreased activity.  Loss of appetite.  Change in behavior.  Headaches.  Seizures.  Vision changes.  Skin ulcers.  Heart disease.  High blood pressure.  Gallstones.  Liver and kidney problems. How is this diagnosed? This condition is diagnosed with blood tests that check for the gene that causes this condition. How is this treated? There is no cure for most cases of this condition. Treatment focuses on managing your symptoms and preventing complications of the disease. Your health care provider will work with you to identify the best treatment options for you based on an assessment of your condition. Treatment may include:  Medicines, including: ? Pain medicines. ? Antibiotic medicines for infection. ? Medicines to increase the production of a protein in red blood cells that helps carry oxygen in the body (hemoglobin).  Fluids to treat pain and swelling.  Oxygen to treat acute chest syndrome.  Blood transfusions to treat symptoms such as fatigue, stroke, and acute chest  syndrome.  Massage and physical therapy for pain.  Regular tests to monitor your condition, such as blood tests, X-rays, CT scans, MRI scans, ultrasounds, and lung function tests. These should be done every 3-12 months, depending on your age.  Hematopoietic stem cell transplant. This is a procedure to replace abnormal stem cells with healthy stem cells from a donor's bone marrow. Stem cells are cells that can develop into blood cells, and bone marrow is the spongy tissue inside the bones. Follow these instructions at home: Medicines  Take over-the-counter and prescription medicines only as told by your health care provider.  If you were prescribed an antibiotic medicine, take it as told by your health care provider. Do not stop taking the antibiotic even if you start to feel better.  If you develop a fever, do not take medicines to reduce the fever right away. This could cover up another problem. Notify your health care provider. Managing pain, stiffness, and swelling  Try these methods to help ease your pain: ? Using a heating pad. ? Taking a warm bath. ? Distracting yourself, such as by watching TV. Eating and drinking  Drink enough fluid to keep your urine clear or pale yellow. Drink more in hot weather and during exercise.  Limit or avoid drinking alcohol.  Eat a balanced and  nutritious diet. Eat plenty of fruits, vegetables, whole grains, and lean protein.  Take vitamins and supplements as directed by your health care provider. Traveling  When traveling, keep these with you: ? Your medical information. ? The names of your health care providers. ? Your medicines.  If you have to travel by air, ask about precautions you should take. Activity  Get plenty of rest.  Avoid activities that will lower your oxygen levels, such as exercising vigorously. General instructions  Do not use any products that contain nicotine or tobacco, such as cigarettes and e-cigarettes. They  lower blood oxygen levels. If you need help quitting, ask your health care provider.  Consider wearing a medical alert bracelet.  Avoid high altitudes.  Avoid extreme temperatures and extreme temperature changes.  Keep all follow-up visits as told by your health care provider. This is important. Contact a health care provider if:  You develop joint pain.  Your feet or hands swell or have pain.  You have fatigue. Get help right away if:  You have symptoms of infection. These include: ? Fever. ? Chills. ? Extreme tiredness. ? Irritability. ? Poor eating. ? Vomiting.  You feel dizzy or faint.  You have new abdominal pain, especially on the left side near the stomach area.  You develop priapism.  You have numbness in your arms or legs or have trouble moving them.  You have trouble talking.  You develop pain that cannot be controlled with medicine.  You become short of breath.  You have rapid breathing.  You have a persistent cough.  You have pain in your chest.  You develop a severe headache or stiff neck.  You feel bloated without eating or after eating a small amount of food.  Your skin is pale.  You suddenly lose vision. Summary  Sickle cell anemia is a condition in which red blood cells have an abnormal "sickle" shape. This disease can cause organ damage and chronic pain, and it can raise your risk of infection.  Sickle cell anemia is a genetic disorder.  Treatment focuses on managing your symptoms and preventing complications of the disease.  Get medical help right away if you have any signs of infection, such as a fever. This information is not intended to replace advice given to you by your health care provider. Make sure you discuss any questions you have with your health care provider. Document Revised: 07/07/2018 Document Reviewed: 02/26/2016 Elsevier Patient Education  2020 [employer] Inc[employer] COVID-19 COVID-19 is a respiratory infection that is  caused by a virus called severe acute respiratory syndrome coronavirus 2 (SARS-CoV-2). The disease is also known as coronavirus disease or novel coronavirus. In some people, the virus may not cause any symptoms. In others, it may cause a serious infection. The infection can get worse quickly and can lead to complications, such as:  Pneumonia, or infection of the lungs.  Acute respiratory distress syndrome or ARDS. This is a condition in which fluid build-up in the lungs prevents the lungs from filling with air and passing oxygen into the blood.  Acute respiratory failure. This is a condition in which there is not enough oxygen passing from the lungs to the body or when carbon dioxide is not passing from the lungs out of the body.  Sepsis or septic shock. This is a serious bodily reaction to an infection.  Blood clotting problems.  Secondary infections due to bacteria or fungus.  Organ failure. This is when your body's organs stop working. The  virus that causes COVID-19 is contagious. This means that it can spread from person to person through droplets from coughs and sneezes (respiratory secretions). What are the causes? This illness is caused by a virus. You may catch the virus by:  Breathing in droplets from an infected person. Droplets can be spread by a person breathing, speaking, singing, coughing, or sneezing.  Touching something, like a table or a doorknob, that was exposed to the virus (contaminated) and then touching your mouth, nose, or eyes. What increases the risk? Risk for infection You are more likely to be infected with this virus if you:  Are within 6 feet (2 meters) of a person with COVID-19.  Provide care for or live with a person who is infected with COVID-19.  Spend time in crowded indoor spaces or live in shared housing. Risk for serious illness You are more likely to become seriously ill from the virus if you:  Are 70 years of age or older. The higher your age,  the more you are at risk for serious illness.  Live in a nursing home or long-term care facility.  Have cancer.  Have a long-term (chronic) disease such as: ? Chronic lung disease, including chronic obstructive pulmonary disease or asthma. ? A long-term disease that lowers your body's ability to fight infection (immunocompromised). ? Heart disease, including heart failure, a condition in which the arteries that lead to the heart become narrow or blocked (coronary artery disease), a disease which makes the heart muscle thick, weak, or stiff (cardiomyopathy). ? Diabetes. ? Chronic kidney disease. ? Sickle cell disease, a condition in which red blood cells have an abnormal "sickle" shape. ? Liver disease.  Are obese. What are the signs or symptoms? Symptoms of this condition can range from mild to severe. Symptoms may appear any time from 2 to 14 days after being exposed to the virus. They include:  A fever or chills.  A cough.  Difficulty breathing.  Headaches, body aches, or muscle aches.  Runny or stuffy (congested) nose.  A sore throat.  New loss of taste or smell. Some people may also have stomach problems, such as nausea, vomiting, or diarrhea. Other people may not have any symptoms of COVID-19. How is this diagnosed? This condition may be diagnosed based on:  Your signs and symptoms, especially if: ? You live in an area with a COVID-19 outbreak. ? You recently traveled to or from an area where the virus is common. ? You provide care for or live with a person who was diagnosed with COVID-19. ? You were exposed to a person who was diagnosed with COVID-19.  A physical exam.  Lab tests, which may include: ? Taking a sample of fluid from the back of your nose and throat (nasopharyngeal fluid), your nose, or your throat using a swab. ? A sample of mucus from your lungs (sputum). ? Blood tests.  Imaging tests, which may include, X-rays, CT scan, or ultrasound. How is  this treated? At present, there is no medicine to treat COVID-19. Medicines that treat other diseases are being used on a trial basis to see if they are effective against COVID-19. Your health care provider will talk with you about ways to treat your symptoms. For most people, the infection is mild and can be managed at home with rest, fluids, and over-the-counter medicines. Treatment for a serious infection usually takes places in a hospital intensive care unit (ICU). It may include one or more of the following treatments.  These treatments are given until your symptoms improve.  Receiving fluids and medicines through an IV.  Supplemental oxygen. Extra oxygen is given through a tube in the nose, a face mask, or a hood.  Positioning you to lie on your stomach (prone position). This makes it easier for oxygen to get into the lungs.  Continuous positive airway pressure (CPAP) or bi-level positive airway pressure (BPAP) machine. This treatment uses mild air pressure to keep the airways open. A tube that is connected to a motor delivers oxygen to the body.  Ventilator. This treatment moves air into and out of the lungs by using a tube that is placed in your windpipe.  Tracheostomy. This is a procedure to create a hole in the neck so that a breathing tube can be inserted.  Extracorporeal membrane oxygenation (ECMO). This procedure gives the lungs a chance to recover by taking over the functions of the heart and lungs. It supplies oxygen to the body and removes carbon dioxide. Follow these instructions at home: Lifestyle  If you are sick, stay home except to get medical care. Your health care provider will tell you how long to stay home. Call your health care provider before you go for medical care.  Rest at home as told by your health care provider.  Do not use any products that contain nicotine or tobacco, such as cigarettes, e-cigarettes, and chewing tobacco. If you need help quitting, ask your  health care provider.  Return to your normal activities as told by your health care provider. Ask your health care provider what activities are safe for you. General instructions  Take over-the-counter and prescription medicines only as told by your health care provider.  Drink enough fluid to keep your urine pale yellow.  Keep all follow-up visits as told by your health care provider. This is important. How is this prevented?  There is no vaccine to help prevent COVID-19 infection. However, there are steps you can take to protect yourself and others from this virus. To protect yourself:   Do not travel to areas where COVID-19 is a risk. The areas where COVID-19 is reported change often. To identify high-risk areas and travel restrictions, check the CDC travel website: [employer]siwwwnc.cdc.gov/travel/notices  If you live in, or must travel to, an area where COVID-19 is a risk, take precautions to avoid infection. ? Stay away from people who are sick. ? Wash your hands often with soap and water for 20 seconds. If soap and water are not available, use an alcohol-based hand sanitizer. ? Avoid touching your mouth, face, eyes, or nose. ? Avoid going out in public, follow guidance from your state and local health authorities. ? If you must go out in public, wear a cloth face covering or face mask. Make sure your mask covers your nose and mouth. ? Avoid crowded indoor spaces. Stay at least 6 feet (2 meters) away from others. ? Disinfect objects and surfaces that are frequently touched every day. This may include:  Counters and tables.  Doorknobs and light switches.  Sinks and faucets.  Electronics, such as phones, remote controls, keyboards, computers, and tablets. To protect others: If you have symptoms of COVID-19, take steps to prevent the virus from spreading to others.  If you think you have a COVID-19 infection, contact your health care provider right away. Tell your health care team that you  think you may have a COVID-19 infection.  Stay home. Leave your house only to seek medical care. Do not use  public transport.  Do not travel while you are sick.  Wash your hands often with soap and water for 20 seconds. If soap and water are not available, use alcohol-based hand sanitizer.  Stay away from other members of your household. Let healthy household members care for children and pets, if possible. If you have to care for children or pets, wash your hands often and wear a mask. If possible, stay in your own room, separate from others. Use a different bathroom.  Make sure that all people in your household wash their hands well and often.  Cough or sneeze into a tissue or your sleeve or elbow. Do not cough or sneeze into your hand or into the air.  Wear a cloth face covering or face mask. Make sure your mask covers your nose and mouth. Where to find more information  Centers for Disease Control and Prevention: StickerEmporium.tn  World Health Organization: https://thompson-craig.com/ Contact a health care provider if:  You live in or have traveled to an area where COVID-19 is a risk and you have symptoms of the infection.  You have had contact with someone who has COVID-19 and you have symptoms of the infection. Get help right away if:  You have trouble breathing.  You have pain or pressure in your chest.  You have confusion.  You have bluish lips and fingernails.  You have difficulty waking from sleep.  You have symptoms that get worse. These symptoms may represent a serious problem that is an emergency. Do not wait to see if the symptoms will go away. Get medical help right away. Call your local emergency services (911 in the [place] Do not drive yourself to the hospital. Let the emergency medical personnel know if you think you have COVID-19. Summary  COVID-19 is a respiratory infection that is caused by a virus. It is also known  as coronavirus disease or novel coronavirus. It can cause serious infections, such as pneumonia, acute respiratory distress syndrome, acute respiratory failure, or sepsis.  The virus that causes COVID-19 is contagious. This means that it can spread from person to person through droplets from breathing, speaking, singing, coughing, or sneezing.  You are more likely to develop a serious illness if you are 74 years of age or older, have a weak immune system, live in a nursing home, or have chronic disease.  There is no medicine to treat COVID-19. Your health care provider will talk with you about ways to treat your symptoms.  Take steps to protect yourself and others from infection. Wash your hands often and disinfect objects and surfaces that are frequently touched every day. Stay away from people who are sick and wear a mask if you are sick. This information is not intended to replace advice given to you by your health care provider. Make sure you discuss any questions you have with your health care provider. Document Revised: 11/19/2018 Document Reviewed: 02/25/2018 Elsevier Patient Education  2020 [employer].

## 2019-10-21 ENCOUNTER — Telehealth: Payer: Self-pay | Admitting: Family Medicine

## 2019-10-21 ENCOUNTER — Other Ambulatory Visit: Payer: Self-pay | Admitting: Family Medicine

## 2019-10-21 DIAGNOSIS — F1190 Opioid use, unspecified, uncomplicated: Secondary | ICD-10-CM

## 2019-10-21 DIAGNOSIS — G894 Chronic pain syndrome: Secondary | ICD-10-CM

## 2019-10-21 DIAGNOSIS — D571 Sickle-cell disease without crisis: Secondary | ICD-10-CM

## 2019-10-21 MED ORDER — OXYCODONE HCL 20 MG PO TABS: 20.0000 mg | ORAL_TABLET | ORAL | 0 refills | Status: DC | PRN

## 2019-10-22 DIAGNOSIS — U071 COVID-19: Secondary | ICD-10-CM

## 2019-10-22 DIAGNOSIS — B9789 Other viral agents as the cause of diseases classified elsewhere: Secondary | ICD-10-CM

## 2019-10-22 HISTORY — DX: COVID-19: U07.1

## 2019-10-22 NOTE — Discharge Summary (Signed)
Physician Discharge Summary  Stephanie Burch WNI:[id number] DOB: [birth year] DOA: 10/15/2019  PCP: Kallie Locks, FNP  Admit date: 10/15/2019  Discharge date: 10/22/2019  Discharge Diagnoses:  Principal Problem:   Sickle cell pain crisis (HCC) Active Problems:   Sickle cell anemia with crisis (HCC)   Chronic pain syndrome   Anxiety   Discharge Condition: Stable  Disposition:  Pt is discharged home in good condition and is to follow up with Kallie Locks, FNP this week to have labs evaluated. Stephanie Burch is instructed to increase activity slowly and balance with rest for the next few days, and use prescribed medication to complete treatment of pain  Diet: Regular Wt Readings from Last 3 Encounters:  10/18/19 (!) 137.7 kg  08/10/19 132.9 kg  07/22/19 132.9 kg    History of present illness:  Stephanie Burch is a 39 year old female with a medical history significant for sickle cell disease type , chronic pain syndrome, opiate dependence and tolerance, morbid obesity, and history of depression and anxiety presented to the emergency room with significant pain to left lower back and lower extremities.  Patient's pain persisted despite home medications.  She rates pain as 9/10, which is consistent with her typical sickle cell crisis.  Patient says that home medications have not been working over the past several days.  She denies any fever, chills, nausea, vomiting, or diarrhea.  ER course: Vital signs stable.  Labs largely within normal range.  Patient's pain persists despite IV Dilaudid, IV fluids, and IV Toradol.  Patient admitted to MedSurg for further management of pain crisis.  Hospital Course:  Sickle cell disease with pain crisis: Patient was admitted for sickle cell pain crisis and managed appropriately with IVF, IV Dilaudid via PCA and IV Toradol, as well as other adjunct therapies per sickle cell pain management protocols.  Patient is very opiate tolerant, IV Dilaudid was  weaned appropriately.  Patient transition to home medications.  Pain intensity decreased to 5/10. Patient advised to follow-up with PCP for medication management as scheduled.  COVID-19 infection: While hospitalized, patient was found to have positive COVID-19 infection.  Patient complained of sore throat, but denies shortness of breath, chest pain, dizziness, fever, or chills.  Patient will continue to isolate at home.  Isolation will and on 11/03/2019.  Patient advised to follow-up in respiratory clinic if symptoms develop such as shortness of breath, hypoxia, or chest pain.  Also, patient is considered high risk and will follow-up for monoclonal antibody infusion as an outpatient.  Patient was therefore discharged home today in a hemodynamically stable condition.   Stela will follow-up with PCP within 1 week of this discharge. Stephanie Burch was counseled extensively about nonpharmacologic means of pain management, patient verbalized understanding and was appreciative of  the care received during this admission.   Patient will resume disease modifying agents including hydroxyurea and Oxbryta.  We discussed the need for good hydration, monitoring of hydration status, avoidance of heat, cold, stress, and infection triggers. We discussed the need to be adherent with taking Hydrea and other home medications. Patient was reminded of the need to seek medical attention immediately if any symptom of bleeding, anemia, or infection occurs.  Discharge Exam: Vitals:   10/20/19 1246 10/20/19 1506  BP:  (!) 121/46  Pulse:  98  Resp: 16 15  Temp:  98.7 F (37.1 C)  SpO2: 100% 99%   Vitals:   10/20/19 0846 10/20/19 1004 10/20/19 1246 10/20/19 1506  BP:  (!) 114/56  (!) 121/46  Pulse:  97  98  Resp: 20 20 16 15   Temp:  99.4 F (37.4 C)  98.7 F (37.1 C)  TempSrc:  Oral  Oral  SpO2: 94% 99% 100% 99%  Weight:      Height:        General appearance : Awake, alert, not in any distress. Speech Clear. Not  toxic looking.  Morbid obesity HEENT: Atraumatic and Normocephalic, pupils equally reactive to light and accomodation Neck: Supple, no JVD. No cervical lymphadenopathy.  Chest: Good air entry bilaterally, no added sounds  CVS: S1 S2 regular, no murmurs.  Abdomen: Bowel sounds present, Non tender and not distended with no gaurding, rigidity or rebound. Extremities: B/L Lower Ext shows no edema, both legs are warm to touch Neurology: Awake alert, and oriented X 3, CN II-XII intact, Non focal Skin: No Rash  Discharge Instructions  Discharge Instructions    Discharge patient   Complete by: As directed    Discharge disposition: 01-Home or Self Care   Discharge patient date: 10/20/2019     Allergies as of 10/20/2019   No Known Allergies     Medication List    STOP taking these medications   Oxycodone HCl 20 MG Tabs     TAKE these medications   DULoxetine 20 MG capsule Commonly known as: CYMBALTA Take 20 mg by mouth daily.   folic acid 1 MG tablet Commonly known as: FOLVITE Take 1 tablet (1 mg total) by mouth daily.   hydroxyurea 500 MG capsule Commonly known as: HYDREA Take 2,000 mg by mouth daily. May take with food to minimize GI side effects.   ibuprofen 800 MG tablet Commonly known as: ADVIL Take 1 tablet (800 mg total) by mouth every 8 (eight) hours as needed for headache, mild pain, moderate pain or cramping.   naloxone 0.4 MG/ML injection Commonly known as: NARCAN As needed   ondansetron 4 MG tablet Commonly known as: ZOFRAN TAKE 1 TABLET BY MOUTH EVERY 8 HOURS AS NEEDED FOR NAUSEA AND VOMITING What changed: See the new instructions.   Oxbryta 500 MG Tabs tablet Generic drug: voxelotor Take 1,500 mg by mouth daily. For Sickle Cell Anemia   tiZANidine 4 MG capsule Commonly known as: ZANAFLEX Take 1 capsule (4 mg total) by mouth 3 (three) times daily as needed for muscle spasms.   valACYclovir 500 MG tablet Commonly known as: Valtrex Take Valtrex 500 mg  daily for suppression of outbreaks. Take Valtrex 1000 mg daily X 1 week for outbreaks.   Vitamin D (Ergocalciferol) 1.25 MG (50000 UNIT) Caps capsule Commonly known as: DRISDOL Take 1 capsule (50,000 Units total) by mouth every 7 (seven) days.   Xtampza ER 18 MG C12a Generic drug: oxyCODONE ER Take 18 mg by mouth every 12 (twelve) hours.       The results of significant diagnostics from this hospitalization (including imaging, microbiology, ancillary and laboratory) are listed below for reference.    Significant Diagnostic Studies: No results found.  Microbiology: Recent Results (from the past 240 hour(s))  SARS Coronavirus 2 by RT PCR (hospital order, performed in [facility] hospital lab) Nasopharyngeal Nasopharyngeal Swab     Status: None   Collection Time: 10/15/19 11:31 AM   Specimen: Nasopharyngeal Swab  Result Value Ref Range Status   SARS Coronavirus 2 NEGATIVE NEGATIVE Final    Comment: (NOTE) SARS-CoV-2 target nucleic acids are NOT DETECTED.  The SARS-CoV-2 RNA is generally detectable in upper and lower respiratory specimens during the acute phase of  infection. The lowest concentration of SARS-CoV-2 viral copies this assay can detect is 250 copies / mL. A negative result does not preclude SARS-CoV-2 infection and should not be used as the sole basis for treatment or other patient management decisions.  A negative result may occur with improper specimen collection / handling, submission of specimen other than nasopharyngeal swab, presence of viral mutation(s) within the areas targeted by this assay, and inadequate number of viral copies (<250 copies / mL). A negative result must be combined with clinical observations, patient history, and epidemiological information.  Fact Sheet for Patients:   BoilerBrush.com.cyhttps://www.fda.gov/media/136312/download  Fact Sheet for Healthcare Providers: https://pope.com/https://www.fda.gov/media/136313/download  This test is not yet approved or  cleared by  the [place] FDA and has been authorized for detection and/or diagnosis of SARS-CoV-2 by FDA under an Emergency Use Authorization (EUA).  This EUA will remain in effect (meaning this test can be used) for the duration of the COVID-19 declaration under Section 564(b)(1) of the Act, 21 U.S.C. section 360bbb-3(b)(1), unless the authorization is terminated or revoked sooner.  Performed at [facility], [address] [place], [place] [id number]   Resp Panel by RT PCR (RSV, Flu A&B, Covid) - Nasopharyngeal Swab     Status: Abnormal   Collection Time: 10/20/19  2:10 PM   Specimen: Nasopharyngeal Swab  Result Value Ref Range Status   SARS Coronavirus 2 by RT PCR POSITIVE (A) NEGATIVE Final    Comment: RESULT CALLED TO, READ BACK BY AND VERIFIED WITH: PLEASANT,E. RN @1622  ON 09.16.2021 BY COHEN,K (NOTE) SARS-CoV-2 target nucleic acids are DETECTED.  SARS-CoV-2 RNA is generally detectable in upper respiratory specimens  during the acute phase of infection. Positive results are indicative of the presence of the identified virus, but do not rule out bacterial infection or co-infection with other pathogens not detected by the test. Clinical correlation with patient history and other diagnostic information is necessary to determine patient infection status. The expected result is Negative.  Fact Sheet for Patients:  https://www.moore.com/https://www.fda.gov/media/142436/download  Fact Sheet for Healthcare Providers: https://www.young.biz/https://www.fda.gov/media/142435/download  This test is not yet approved or cleared by the [place] FDA and  has been authorized for detection and/or diagnosis of SARS-CoV-2 by FDA under an Emergency Use Authorization (EUA).  This EUA will remain in effect (meaning this t est can be used) for the duration of  the COVID-19 declaration under Section 564(b)(1) of the Act, 21 U.S.C. section 360bbb-3(b)(1), unless the authorization is terminated or revoked sooner.       Influenza A by PCR NEGATIVE NEGATIVE Final   Influenza B by PCR NEGATIVE NEGATIVE Final    Comment: (NOTE) The Xpert Xpress SARS-CoV-2/FLU/RSV assay is intended as an aid in  the diagnosis of influenza from Nasopharyngeal swab specimens and  should not be used as a sole basis for treatment. Nasal washings and  aspirates are unacceptable for Xpert Xpress SARS-CoV-2/FLU/RSV  testing.  Fact Sheet for Patients: https://www.moore.com/https://www.fda.gov/media/142436/download  Fact Sheet for Healthcare Providers: https://www.young.biz/https://www.fda.gov/media/142435/download  This test is not yet approved or cleared by the [place] FDA and  has been authorized for detection and/or diagnosis of SARS-CoV-2 by  FDA under an Emergency Use Authorization (EUA). This EUA will remain  in effect (meaning this test can be used) for the duration of the  Covid-19 declaration under Section 564(b)(1) of the Act, 21  U.S.C. section 360bbb-3(b)(1), unless the authorization is  terminated or revoked.    Respiratory Syncytial Virus by PCR NEGATIVE NEGATIVE Final  Comment: (NOTE) Fact Sheet for Patients: https://www.moore.com/  Fact Sheet for Healthcare Providers: https://www.young.biz/  This test is not yet approved or cleared by the [place] FDA and  has been authorized for detection and/or diagnosis of SARS-CoV-2 by  FDA under an Emergency Use Authorization (EUA). This EUA will remain  in effect (meaning this test can be used) for the duration of the  COVID-19 declaration under Section 564(b)(1) of the Act, 21 U.S.C.  section 360bbb-3(b)(1), unless the authorization is terminated or  revoked. Performed at [facility], [address] [place], [place] [address]   Group A Strep by PCR     Status: None   Collection Time: 10/20/19  2:22 PM   Specimen: Throat; Sterile Swab  Result Value Ref Range Status   Group A Strep by PCR NOT DETECTED NOT DETECTED Final    Comment:  Performed at [facility], [address] [place], [place] [address]     Labs: Basic Metabolic Panel: Recent Labs  Lab 10/16/19 0620 10/17/19 0540  NA 139 140  K 3.8 3.7  CL 104 105  CO2 24 28  GLUCOSE 100* 80  BUN 9 11  CREATININE 0.54 0.63  CALCIUM 9.1 8.8*   Liver Function Tests: Recent Labs  Lab 10/16/19 0620 10/17/19 0540  AST 15 26  ALT 14 26  ALKPHOS 77 69  BILITOT 0.9 1.0  PROT 7.2 6.4*  ALBUMIN 3.9 3.5   No results for input(s): LIPASE, AMYLASE in the last 168 hours. No results for input(s): AMMONIA in the last 168 hours. CBC: Recent Labs  Lab 10/16/19 0620 10/17/19 0540 10/19/19 0909  WBC 8.8 10.7* 10.6*  NEUTROABS 5.2 6.6  --   HGB 11.5* 10.3* 10.4*  HCT 33.2* 29.9* 28.6*  MCV 73.0* 74.4* 70.1*  PLT 207 203 143*   Cardiac Enzymes: No results for input(s): CKTOTAL, CKMB, CKMBINDEX, TROPONINI in the last 168 hours. BNP: Invalid input(s): POCBNP CBG: No results for input(s): GLUCAP in the last 168 hours.  Time coordinating discharge: 35 minutes  Signed:  Nolon Nations  APRN, MSN, FNP-C Patient Care [facility] [address] [place], [place] [address] [phone]  [facility] 10/22/2019, 3:37 PM

## 2019-10-23 ENCOUNTER — Encounter: Payer: Self-pay | Admitting: Family Medicine

## 2019-10-24 ENCOUNTER — Telehealth: Payer: Self-pay | Admitting: Unknown Physician Specialty

## 2019-10-24 ENCOUNTER — Ambulatory Visit: Payer: Self-pay | Admitting: Family Medicine

## 2019-10-24 ENCOUNTER — Telehealth: Payer: Self-pay | Admitting: Family Medicine

## 2019-10-24 NOTE — Telephone Encounter (Signed)
Called to Discuss with patient about Covid symptoms and the use of the monoclonal antibody infusion for those with mild to moderate Covid symptoms and at a high risk of hospitalization.     Pt appears to qualify for this infusion due to co-morbid conditions and/or a member of an at-risk group in accordance with the FDA Emergency Use Authorization.    Unable to reach pt    

## 2019-10-24 NOTE — Telephone Encounter (Signed)
Done

## 2019-10-25 NOTE — Telephone Encounter (Signed)
Sent to Provider 

## 2019-10-31 ENCOUNTER — Telehealth: Payer: Self-pay | Admitting: Family Medicine

## 2019-10-31 NOTE — Telephone Encounter (Signed)
Called left VM for Pt to call the office the schedule appointment

## 2019-11-07 ENCOUNTER — Encounter (HOSPITAL_COMMUNITY): Payer: Self-pay

## 2019-11-07 ENCOUNTER — Inpatient Hospital Stay (HOSPITAL_COMMUNITY)
Admission: EM | Admit: 2019-11-07 | Discharge: 2019-11-18 | DRG: 871 | Disposition: A | Discharge status: Home / Self Care | Payer: Medicaid Other | Attending: Internal Medicine | Admitting: Internal Medicine

## 2019-11-07 ENCOUNTER — Emergency Department (HOSPITAL_COMMUNITY): Payer: Medicaid Other

## 2019-11-07 ENCOUNTER — Other Ambulatory Visit: Payer: Self-pay

## 2019-11-07 DIAGNOSIS — Z79899 Other long term (current) drug therapy: Secondary | ICD-10-CM

## 2019-11-07 DIAGNOSIS — A419 Sepsis, unspecified organism: Secondary | ICD-10-CM | POA: Diagnosis not present

## 2019-11-07 DIAGNOSIS — Z833 Family history of diabetes mellitus: Secondary | ICD-10-CM

## 2019-11-07 DIAGNOSIS — D5700 Hb-SS disease with crisis, unspecified: Secondary | ICD-10-CM | POA: Diagnosis present

## 2019-11-07 DIAGNOSIS — U071 COVID-19: Secondary | ICD-10-CM | POA: Diagnosis not present

## 2019-11-07 DIAGNOSIS — J189 Pneumonia, unspecified organism: Secondary | ICD-10-CM

## 2019-11-07 DIAGNOSIS — Z832 Family history of diseases of the blood and blood-forming organs and certain disorders involving the immune mechanism: Secondary | ICD-10-CM

## 2019-11-07 DIAGNOSIS — N179 Acute kidney failure, unspecified: Secondary | ICD-10-CM | POA: Diagnosis present

## 2019-11-07 DIAGNOSIS — I878 Other specified disorders of veins: Secondary | ICD-10-CM

## 2019-11-07 DIAGNOSIS — Z8616 Personal history of COVID-19: Secondary | ICD-10-CM | POA: Diagnosis not present

## 2019-11-07 DIAGNOSIS — R0902 Hypoxemia: Secondary | ICD-10-CM | POA: Diagnosis not present

## 2019-11-07 DIAGNOSIS — D57211 Sickle-cell/Hb-C disease with acute chest syndrome: Secondary | ICD-10-CM | POA: Diagnosis not present

## 2019-11-07 DIAGNOSIS — Y95 Nosocomial condition: Secondary | ICD-10-CM | POA: Diagnosis present

## 2019-11-07 DIAGNOSIS — I517 Cardiomegaly: Secondary | ICD-10-CM | POA: Diagnosis not present

## 2019-11-07 DIAGNOSIS — R7401 Elevation of levels of liver transaminase levels: Secondary | ICD-10-CM | POA: Diagnosis not present

## 2019-11-07 DIAGNOSIS — E538 Deficiency of other specified B group vitamins: Secondary | ICD-10-CM | POA: Diagnosis present

## 2019-11-07 DIAGNOSIS — F411 Generalized anxiety disorder: Secondary | ICD-10-CM | POA: Diagnosis not present

## 2019-11-07 DIAGNOSIS — F32A Depression, unspecified: Secondary | ICD-10-CM | POA: Diagnosis not present

## 2019-11-07 DIAGNOSIS — Z6841 Body Mass Index (BMI) 40.0 and over, adult: Secondary | ICD-10-CM

## 2019-11-07 DIAGNOSIS — R000 Tachycardia, unspecified: Secondary | ICD-10-CM | POA: Diagnosis not present

## 2019-11-07 DIAGNOSIS — R0602 Shortness of breath: Secondary | ICD-10-CM | POA: Diagnosis not present

## 2019-11-07 DIAGNOSIS — F419 Anxiety disorder, unspecified: Secondary | ICD-10-CM | POA: Diagnosis not present

## 2019-11-07 DIAGNOSIS — Z8249 Family history of ischemic heart disease and other diseases of the circulatory system: Secondary | ICD-10-CM

## 2019-11-07 DIAGNOSIS — G894 Chronic pain syndrome: Secondary | ICD-10-CM | POA: Diagnosis not present

## 2019-11-07 DIAGNOSIS — I10 Essential (primary) hypertension: Secondary | ICD-10-CM | POA: Diagnosis not present

## 2019-11-07 DIAGNOSIS — J811 Chronic pulmonary edema: Secondary | ICD-10-CM

## 2019-11-07 DIAGNOSIS — Z79891 Long term (current) use of opiate analgesic: Secondary | ICD-10-CM | POA: Diagnosis not present

## 2019-11-07 DIAGNOSIS — E876 Hypokalemia: Secondary | ICD-10-CM | POA: Diagnosis not present

## 2019-11-07 DIAGNOSIS — E559 Vitamin D deficiency, unspecified: Secondary | ICD-10-CM | POA: Diagnosis present

## 2019-11-07 DIAGNOSIS — D72829 Elevated white blood cell count, unspecified: Secondary | ICD-10-CM | POA: Diagnosis present

## 2019-11-07 DIAGNOSIS — E875 Hyperkalemia: Secondary | ICD-10-CM | POA: Diagnosis not present

## 2019-11-07 DIAGNOSIS — I509 Heart failure, unspecified: Secondary | ICD-10-CM | POA: Diagnosis not present

## 2019-11-07 HISTORY — DX: Acute kidney failure, unspecified: N17.9

## 2019-11-07 HISTORY — DX: Sepsis, unspecified organism: A41.9

## 2019-11-07 HISTORY — DX: Pneumonia, unspecified organism: J18.9

## 2019-11-07 LAB — CBC WITH DIFFERENTIAL/PLATELET
Abs Immature Granulocytes: 0.27 10*3/uL — ABNORMAL HIGH (ref 0.00–0.07)
Basophils Absolute: 0.1 10*3/uL (ref 0.0–0.1)
Basophils Relative: 0 %
Eosinophils Absolute: 0 10*3/uL (ref 0.0–0.5)
Eosinophils Relative: 0 %
HCT: 32.1 % — ABNORMAL LOW (ref 36.0–46.0)
Hemoglobin: 9.7 g/dL — ABNORMAL LOW (ref 12.0–15.0)
Immature Granulocytes: 1 %
Lymphocytes Relative: 25 %
Lymphs Abs: 7.6 10*3/uL — ABNORMAL HIGH (ref 0.7–4.0)
MCH: 25.7 pg — ABNORMAL LOW (ref 26.0–34.0)
MCHC: 30.2 g/dL (ref 30.0–36.0)
MCV: 84.9 fL (ref 80.0–100.0)
Monocytes Absolute: 1.9 10*3/uL — ABNORMAL HIGH (ref 0.1–1.0)
Monocytes Relative: 6 %
Neutro Abs: 20.6 10*3/uL — ABNORMAL HIGH (ref 1.7–7.7)
Neutrophils Relative %: 68 %
Platelets: 194 10*3/uL (ref 150–400)
RBC: 3.78 MIL/uL — ABNORMAL LOW (ref 3.87–5.11)
RDW: 26.2 % — ABNORMAL HIGH (ref 11.5–15.5)
WBC: 30.5 10*3/uL — ABNORMAL HIGH (ref 4.0–10.5)
nRBC: 3.7 % — ABNORMAL HIGH (ref 0.0–0.2)

## 2019-11-07 LAB — COMPREHENSIVE METABOLIC PANEL
ALT: 92 U/L — ABNORMAL HIGH (ref 0–44)
AST: 78 U/L — ABNORMAL HIGH (ref 15–41)
Albumin: 3.2 g/dL — ABNORMAL LOW (ref 3.5–5.0)
Alkaline Phosphatase: 76 U/L (ref 38–126)
Anion gap: 12 (ref 5–15)
BUN: 17 mg/dL (ref 6–20)
CO2: 21 mmol/L — ABNORMAL LOW (ref 22–32)
Calcium: 8.7 mg/dL — ABNORMAL LOW (ref 8.9–10.3)
Chloride: 113 mmol/L — ABNORMAL HIGH (ref 98–111)
Creatinine, Ser: 1.08 mg/dL — ABNORMAL HIGH (ref 0.44–1.00)
GFR calc Af Amer: >60 mL/min (ref >60)
GFR calc non Af Amer: >60 mL/min (ref >60)
Glucose, Bld: 122 mg/dL — ABNORMAL HIGH (ref 70–99)
Potassium: 5.7 mmol/L — ABNORMAL HIGH (ref 3.5–5.1)
Sodium: 146 mmol/L — ABNORMAL HIGH (ref 135–145)
Total Bilirubin: 3.4 mg/dL — ABNORMAL HIGH (ref 0.3–1.2)
Total Protein: 8.2 g/dL — ABNORMAL HIGH (ref 6.5–8.1)

## 2019-11-07 LAB — I-STAT BETA HCG BLOOD, ED (MC, WL, AP ONLY): I-stat hCG, quantitative: <5 m[IU]/mL (ref <5)

## 2019-11-07 LAB — RETICULOCYTES
Immature Retic Fract: 39.8 % — ABNORMAL HIGH (ref 2.3–15.9)
RBC.: 3.6 MIL/uL — ABNORMAL LOW (ref 3.87–5.11)
Retic Count, Absolute: 335 10*3/uL — ABNORMAL HIGH (ref 19.0–186.0)
Retic Ct Pct: 9.3 % — ABNORMAL HIGH (ref 0.4–3.1)

## 2019-11-07 LAB — URINALYSIS, ROUTINE W REFLEX MICROSCOPIC
Bilirubin Urine: NEGATIVE
Glucose, UA: NEGATIVE mg/dL
Ketones, ur: 5 mg/dL — AB
Nitrite: NEGATIVE
Protein, ur: 100 mg/dL — AB
Specific Gravity, Urine: 1.018 (ref 1.005–1.030)
pH: 6 (ref 5.0–8.0)

## 2019-11-07 LAB — TRIGLYCERIDES: Triglycerides: 149 mg/dL (ref <150)

## 2019-11-07 LAB — FERRITIN: Ferritin: 338 ng/mL — ABNORMAL HIGH (ref 11–307)

## 2019-11-07 LAB — FIBRINOGEN: Fibrinogen: 715 mg/dL — ABNORMAL HIGH (ref 210–475)

## 2019-11-07 LAB — C-REACTIVE PROTEIN: CRP: 25.5 mg/dL — ABNORMAL HIGH (ref <1.0)

## 2019-11-07 LAB — D-DIMER, QUANTITATIVE: D-Dimer, Quant: 10 ug/mL-FEU — ABNORMAL HIGH (ref 0.00–0.50)

## 2019-11-07 LAB — LACTATE DEHYDROGENASE
LDH: 773 U/L — ABNORMAL HIGH (ref 98–192)
LDH: 390 U/L — ABNORMAL HIGH (ref 98–192)

## 2019-11-07 LAB — LACTIC ACID, PLASMA
Lactic Acid, Venous: 2.5 mmol/L (ref 0.5–1.9)
Lactic Acid, Venous: 1.9 mmol/L (ref 0.5–1.9)

## 2019-11-07 LAB — PROCALCITONIN: Procalcitonin: 0.23 ng/mL

## 2019-11-07 MED ORDER — VOXELOTOR 500 MG PO TABS: 1500.0000 mg | ORAL_TABLET | Freq: Every day | ORAL | Status: DC

## 2019-11-07 MED ORDER — DULOXETINE HCL 20 MG PO CPEP
20.0000 mg | ORAL_CAPSULE | Freq: Every day | ORAL | Status: DC
  Administered 2019-11-07 – 2019-11-18 (×11): 20 mg via ORAL
  Filled 2019-11-07 (×12): qty 1

## 2019-11-07 MED ORDER — NALOXONE HCL 0.4 MG/ML IJ SOLN: 0.4000 mg | INTRAMUSCULAR | Status: DC | PRN

## 2019-11-07 MED ORDER — PIPERACILLIN-TAZOBACTAM 3.375 G IVPB 30 MIN: 3.3750 g | Freq: Once | INTRAVENOUS | Status: DC

## 2019-11-07 MED ORDER — KETOROLAC TROMETHAMINE 15 MG/ML IJ SOLN
15.0000 mg | INTRAMUSCULAR | Status: AC
  Administered 2019-11-07: 15 mg via INTRAVENOUS
  Filled 2019-11-07: qty 1

## 2019-11-07 MED ORDER — SODIUM CHLORIDE 0.9 % IV SOLN
2.0000 g | Freq: Three times a day (TID) | INTRAVENOUS | Status: AC
  Administered 2019-11-08 – 2019-11-13 (×16): 2 g via INTRAVENOUS
  Filled 2019-11-07 (×16): qty 2

## 2019-11-07 MED ORDER — ONDANSETRON HCL 4 MG/2ML IJ SOLN
4.0000 mg | INTRAMUSCULAR | Status: DC | PRN
  Administered 2019-11-07: 4 mg via INTRAVENOUS
  Filled 2019-11-07: qty 2

## 2019-11-07 MED ORDER — DIPHENHYDRAMINE HCL 25 MG PO CAPS: 25.0000 mg | ORAL_CAPSULE | ORAL | Status: DC | PRN

## 2019-11-07 MED ORDER — VANCOMYCIN HCL IN DEXTROSE 1-5 GM/200ML-% IV SOLN: 1000.0000 mg | Freq: Once | INTRAVENOUS | Status: DC

## 2019-11-07 MED ORDER — SODIUM CHLORIDE 0.45 % IV SOLN: INTRAVENOUS | Status: DC

## 2019-11-07 MED ORDER — SODIUM CHLORIDE 0.45 % IV SOLN: INTRAVENOUS | Status: AC

## 2019-11-07 MED ORDER — LACTATED RINGERS IV SOLN: INTRAVENOUS | Status: DC

## 2019-11-07 MED ORDER — VANCOMYCIN HCL 2000 MG/400ML IV SOLN
2000.0000 mg | Freq: Once | INTRAVENOUS | Status: AC
  Administered 2019-11-07: 2000 mg via INTRAVENOUS
  Filled 2019-11-07: qty 400

## 2019-11-07 MED ORDER — FOLIC ACID 1 MG PO TABS
1.0000 mg | ORAL_TABLET | Freq: Every day | ORAL | Status: DC
  Administered 2019-11-07 – 2019-11-18 (×12): 1 mg via ORAL
  Filled 2019-11-07 (×12): qty 1

## 2019-11-07 MED ORDER — VITAMIN D (ERGOCALCIFEROL) 1.25 MG (50000 UNIT) PO CAPS
50000.0000 [IU] | ORAL_CAPSULE | ORAL | Status: DC
  Administered 2019-11-07 – 2019-11-14 (×2): 50000 [IU] via ORAL
  Filled 2019-11-07 (×2): qty 1

## 2019-11-07 MED ORDER — HYDROMORPHONE HCL 2 MG/ML IJ SOLN
2.0000 mg | INTRAMUSCULAR | Status: AC
  Administered 2019-11-07: 2 mg via INTRAVENOUS
  Filled 2019-11-07: qty 1

## 2019-11-07 MED ORDER — ENOXAPARIN SODIUM 40 MG/0.4ML ~~LOC~~ SOLN
40.0000 mg | SUBCUTANEOUS | Status: DC
  Administered 2019-11-07 – 2019-11-17 (×10): 40 mg via SUBCUTANEOUS
  Filled 2019-11-07 (×10): qty 0.4

## 2019-11-07 MED ORDER — HYDROMORPHONE 1 MG/ML IV SOLN
INTRAVENOUS | Status: DC
  Administered 2019-11-07: 30 mg via INTRAVENOUS
  Administered 2019-11-07: 4.6 mg via INTRAVENOUS
  Administered 2019-11-08: 3 mg via INTRAVENOUS
  Administered 2019-11-08 – 2019-11-09 (×2): 30 mg via INTRAVENOUS
  Administered 2019-11-10: 7.2 mg via INTRAVENOUS
  Administered 2019-11-10: 0 mg via INTRAVENOUS
  Administered 2019-11-10: 30 mg via INTRAVENOUS
  Administered 2019-11-11: 1.2 mg via INTRAVENOUS
  Administered 2019-11-11: 3 mL via INTRAVENOUS
  Administered 2019-11-11: 30 mg via INTRAVENOUS
  Administered 2019-11-11: 6.6 mg via INTRAVENOUS
  Administered 2019-11-11: 11.4 mg via INTRAVENOUS
  Administered 2019-11-12: 8.4 mg via INTRAVENOUS
  Administered 2019-11-12: 9.6 mg via INTRAVENOUS
  Administered 2019-11-12: 2.4 mg via INTRAVENOUS
  Administered 2019-11-12: 8.4 mg via INTRAVENOUS
  Administered 2019-11-12: 30 mg via INTRAVENOUS
  Administered 2019-11-13: 6 mg via INTRAVENOUS
  Administered 2019-11-13 (×2): 1.2 mg via INTRAVENOUS
  Administered 2019-11-13: 3.8 mg via INTRAVENOUS
  Administered 2019-11-13: 30 mg via INTRAVENOUS
  Administered 2019-11-13: 3 mg via INTRAVENOUS
  Administered 2019-11-15 (×2): 5.4 mg via INTRAVENOUS
  Administered 2019-11-15: 30 mg via INTRAVENOUS
  Filled 2019-11-07 (×8): qty 30

## 2019-11-07 MED ORDER — HYDROXYUREA 500 MG PO CAPS
2000.0000 mg | ORAL_CAPSULE | Freq: Every day | ORAL | Status: DC
  Administered 2019-11-08 – 2019-11-18 (×11): 2000 mg via ORAL
  Filled 2019-11-07 (×12): qty 4

## 2019-11-07 MED ORDER — SODIUM CHLORIDE 0.9% FLUSH: 9.0000 mL | INTRAVENOUS | Status: DC | PRN

## 2019-11-07 MED ORDER — ACETAMINOPHEN 325 MG PO TABS
650.0000 mg | ORAL_TABLET | ORAL | Status: DC | PRN
  Administered 2019-11-08 – 2019-11-17 (×6): 650 mg via ORAL
  Filled 2019-11-07 (×7): qty 2

## 2019-11-07 MED ORDER — KETOROLAC TROMETHAMINE 15 MG/ML IJ SOLN: 15.0000 mg | Freq: Four times a day (QID) | INTRAMUSCULAR | Status: DC

## 2019-11-07 MED ORDER — SENNOSIDES-DOCUSATE SODIUM 8.6-50 MG PO TABS
1.0000 | ORAL_TABLET | Freq: Two times a day (BID) | ORAL | Status: DC
  Administered 2019-11-07 – 2019-11-18 (×20): 1 via ORAL
  Filled 2019-11-07 (×21): qty 1

## 2019-11-07 MED ORDER — HYDROMORPHONE HCL 2 MG/ML IJ SOLN
2.0000 mg | INTRAMUSCULAR | Status: DC | PRN
  Administered 2019-11-07: 2 mg via INTRAVENOUS
  Filled 2019-11-07: qty 1

## 2019-11-07 MED ORDER — SODIUM CHLORIDE 0.9 % IV SOLN: 2.0000 g | Freq: Once | INTRAVENOUS | Status: DC

## 2019-11-07 MED ORDER — VANCOMYCIN HCL IN DEXTROSE 1-5 GM/200ML-% IV SOLN
1000.0000 mg | Freq: Two times a day (BID) | INTRAVENOUS | Status: DC
  Administered 2019-11-08 – 2019-11-10 (×5): 1000 mg via INTRAVENOUS
  Filled 2019-11-07 (×5): qty 200

## 2019-11-07 MED ORDER — LACTATED RINGERS IV BOLUS (SEPSIS): 1000.0000 mL | Freq: Once | INTRAVENOUS | Status: DC

## 2019-11-07 MED ORDER — ONDANSETRON HCL 4 MG/2ML IJ SOLN
4.0000 mg | Freq: Four times a day (QID) | INTRAMUSCULAR | Status: DC | PRN
  Administered 2019-11-11 – 2019-11-14 (×3): 4 mg via INTRAVENOUS
  Filled 2019-11-07 (×3): qty 2

## 2019-11-07 MED ORDER — VALACYCLOVIR HCL 500 MG PO TABS
500.0000 mg | ORAL_TABLET | Freq: Two times a day (BID) | ORAL | Status: DC
  Administered 2019-11-07 – 2019-11-18 (×22): 500 mg via ORAL
  Filled 2019-11-07 (×22): qty 1

## 2019-11-07 MED ORDER — SODIUM CHLORIDE 0.9 % IV SOLN
25.0000 mg | INTRAVENOUS | Status: DC | PRN
  Filled 2019-11-07: qty 0.5

## 2019-11-07 MED ORDER — TIZANIDINE HCL 4 MG PO TABS
4.0000 mg | ORAL_TABLET | Freq: Three times a day (TID) | ORAL | Status: DC | PRN
  Administered 2019-11-08 – 2019-11-16 (×7): 4 mg via ORAL
  Filled 2019-11-07 (×7): qty 1

## 2019-11-07 MED ORDER — POLYETHYLENE GLYCOL 3350 17 G PO PACK: 17.0000 g | PACK | Freq: Every day | ORAL | Status: DC | PRN

## 2019-11-07 MED ORDER — OXYCODONE HCL ER 20 MG PO T12A
20.0000 mg | EXTENDED_RELEASE_TABLET | Freq: Two times a day (BID) | ORAL | Status: DC
  Administered 2019-11-07 – 2019-11-18 (×21): 20 mg via ORAL
  Filled 2019-11-07 (×21): qty 1

## 2019-11-07 MED ORDER — SODIUM CHLORIDE 0.9 % IV SOLN
2.0000 g | Freq: Once | INTRAVENOUS | Status: AC
  Administered 2019-11-07: 2 g via INTRAVENOUS
  Filled 2019-11-07: qty 2

## 2019-11-07 NOTE — ED Provider Notes (Signed)
[facility]-EMERGENCY DEPT Provider Note   CSN: [id number] Arrival date & time: 11/07/19  1052     History Chief Complaint  Patient presents with   Chest Pain   Sickle Cell Pain Crisis   Shortness of Breath    Stephanie Burch is a 39 y.o. female.  HPI    Patient with a history of sickle cell disease, recent hospitalization, and known Covid infection presents with dyspnea, fatigue, headache, chest pain. Pain is diffuse, severe, not improved with home narcotics. She notes that she was diagnosed while hospitalized for a pain crisis, 2 weeks ago.  She has been unable to receive infusions or other novel therapy as she has been symptomatic for too long.  Today she presents due to complaints as above, frustration at inability to achieve any relief with her home medication regimen. No confusion, no vomiting, no syncope.  Past Medical History:  Diagnosis Date   HSV infection    Nausea    Sickle cell anemia (HCC)    Vitamin B12 deficiency 12/2018   Vitamin D deficiency     Patient Active Problem List   Diagnosis Date Noted   COVID-19 virus detected 10/22/2019   Sore throat due to virus 10/22/2019   Urinary frequency 04/20/2019   Vaginal odor 04/20/2019   Chest pain varying with breathing    Fever and chills    Anxiety 11/15/2018   Vitamin D deficiency 11/15/2018   Nausea 11/15/2018   Abnormal pulse oximetry    Hypokalemia 09/20/2018   Medication management 09/14/2018   Muscle spasms of both lower extremities 08/25/2018   Subjective visual disturbance of right eye 08/25/2018   Hb-SS disease without crisis (HCC) 04/13/2018   Chronic pain syndrome 03/16/2018   Chronic, continuous use of opioids 03/16/2018   Tachycardia with heart rate 100-120 beats per minute    Sickle cell anemia with crisis (HCC) 10/19/2017   Leukocytosis 10/19/2017   Thrombocytopenia (HCC) 10/19/2017   Morbidly obese (HCC) 10/19/2017   Sickle cell pain  crisis (HCC) 10/15/2017    History reviewed. No pertinent surgical history.   OB History    Gravida  3   Para      Term      Preterm      AB      Living  3     SAB      TAB      Ectopic      Multiple      Live Births              Family History  Problem Relation Age of Onset   Hypertension Mother    Sickle cell trait Mother    Diabetes Father    Sickle cell trait Father     Social History   Tobacco Use   Smoking status: Never Smoker   Smokeless tobacco: Never Used  Vaping Use   Vaping Use: Never used  Substance Use Topics   Alcohol use: Not Currently   Drug use: Never    Home Medications Prior to Admission medications   Medication Sig Start Date End Date Taking? Authorizing Provider  DULoxetine (CYMBALTA) 20 MG capsule Take 20 mg by mouth daily. Patient not taking: Reported on 08/11/2019    [provider]  folic acid (FOLVITE) 1 MG tablet Take 1 tablet (1 mg total) by mouth daily. 11/15/18   Kallie Locks, FNP  hydroxyurea (HYDREA) 500 MG capsule Take 2,000 mg by mouth daily. May take with food  to minimize GI side effects. Patient not taking: Reported on 08/11/2019    [provider]  ibuprofen (ADVIL) 800 MG tablet Take 1 tablet (800 mg total) by mouth every 8 (eight) hours as needed for headache, mild pain, moderate pain or cramping. 08/15/19   Kallie Locks, FNP  naloxone [facility]) 0.4 MG/ML injection As needed 09/14/18   Kallie Locks, FNP  ondansetron (ZOFRAN) 4 MG tablet TAKE 1 TABLET BY MOUTH EVERY 8 HOURS AS NEEDED FOR NAUSEA AND VOMITING Patient taking differently: Take 4 mg by mouth every 8 (eight) hours as needed for nausea or vomiting.  07/14/19   Kallie Locks, FNP  oxyCODONE ER [facility]) 18 MG C12A Take 18 mg by mouth every 12 (twelve) hours. 09/29/19   Kallie Locks, FNP  Oxycodone HCl 20 MG TABS Take 1 tablet (20 mg total) by mouth every 4 (four) hours as needed (for pain). 10/21/19   Kallie Locks, FNP  tiZANidine (ZANAFLEX) 4 MG capsule Take 1 capsule (4 mg total) by mouth 3 (three) times daily as needed for muscle spasms. 10/07/19 05/04/20  Kallie Locks, FNP  valACYclovir (VALTREX) 500 MG tablet Take Valtrex 500 mg daily for suppression of outbreaks. Take Valtrex 1000 mg daily X 1 week for outbreaks. 10/20/19   Kallie Locks, FNP  Vitamin D, Ergocalciferol, (DRISDOL) 1.25 MG (50000 UT) CAPS capsule Take 1 capsule (50,000 Units total) by mouth every 7 (seven) days. 12/21/18   Kallie Locks, FNP  voxelotor (OXBRYTA) 500 MG TABS tablet Take 1,500 mg by mouth daily. For Sickle Cell Anemia    [provider]    Allergies    Patient has no known allergies.  Review of Systems   Review of Systems  Constitutional:       Per HPI, otherwise negative  HENT:       Per HPI, otherwise negative  Respiratory:       Per HPI, otherwise negative  Cardiovascular:       Per HPI, otherwise negative  Gastrointestinal: Positive for nausea. Negative for vomiting.  Endocrine:       Negative aside from HPI  Genitourinary:       Neg aside from HPI   Musculoskeletal:       Per HPI, otherwise negative  Skin: Negative.   Allergic/Immunologic: Positive for immunocompromised state.  Neurological: Positive for weakness and headaches. Negative for syncope.    Physical Exam Updated Vital Signs BP 119/85    Pulse (!) 131    Temp 99.6 F (37.6 C) (Oral)    Resp (!) 36    Ht 5\' 5"  (1.651 m)    Wt 127 kg    SpO2 99%    BMI 46.59 kg/m   Physical Exam Vitals and nursing note reviewed.  Constitutional:      General: She is not in acute distress.    Appearance: She is well-developed. She is obese. She is ill-appearing.  HENT:     Head: Normocephalic and atraumatic.  Eyes:     Conjunctiva/sclera: Conjunctivae normal.  Cardiovascular:     Rate and Rhythm: Regular rhythm. Tachycardia present.  Pulmonary:     Effort: Tachypnea present.     Breath sounds: Normal breath sounds.   Abdominal:     General: There is no distension.  Skin:    General: Skin is warm and dry.  Neurological:     Mental Status: She is alert and oriented to person, place, and time.  Cranial Nerves: No cranial nerve deficit.      ED Results / Procedures / Treatments   Labs (all labs ordered are listed, but only abnormal results are displayed) Labs Reviewed  LACTIC ACID, PLASMA - Abnormal; Notable for the following components:      Result Value   Lactic Acid, Venous 2.5 (*)    All other components within normal limits  COMPREHENSIVE METABOLIC PANEL - Abnormal; Notable for the following components:   Sodium 146 (*)    Potassium 5.7 (*)    Chloride 113 (*)    CO2 21 (*)    Glucose, Bld 122 (*)    Creatinine, Ser 1.08 (*)    Calcium 8.7 (*)    Total Protein 8.2 (*)    Albumin 3.2 (*)    AST 78 (*)    ALT 92 (*)    Total Bilirubin 3.4 (*)    All other components within normal limits  CBC WITH DIFFERENTIAL/PLATELET - Abnormal; Notable for the following components:   WBC 30.5 (*)    RBC 3.78 (*)    Hemoglobin 9.7 (*)    HCT 32.1 (*)    MCH 25.7 (*)    RDW 26.2 (*)    nRBC 3.7 (*)    Neutro Abs 20.6 (*)    Lymphs Abs 7.6 (*)    Monocytes Absolute 1.9 (*)    Abs Immature Granulocytes 0.27 (*)    All other components within normal limits  RETICULOCYTES - Abnormal; Notable for the following components:   Retic Ct Pct 9.3 (*)    RBC. 3.60 (*)    Retic Count, Absolute 335.0 (*)    Immature Retic Fract 39.8 (*)    All other components within normal limits  D-DIMER, QUANTITATIVE (NOT AT [facility]) - Abnormal; Notable for the following components:   D-Dimer, Quant 10.00 (*)    All other components within normal limits  LACTATE DEHYDROGENASE - Abnormal; Notable for the following components:   LDH 773 (*)    All other components within normal limits  FERRITIN - Abnormal; Notable for the following components:   Ferritin 338 (*)    All other components within normal limits   FIBRINOGEN - Abnormal; Notable for the following components:   Fibrinogen 715 (*)    All other components within normal limits  C-REACTIVE PROTEIN - Abnormal; Notable for the following components:   CRP 25.5 (*)    All other components within normal limits  CULTURE, BLOOD (ROUTINE X 2)  CULTURE, BLOOD (ROUTINE X 2)  PROCALCITONIN  TRIGLYCERIDES  LACTIC ACID, PLASMA  URINALYSIS, ROUTINE W REFLEX MICROSCOPIC  I-STAT BETA HCG BLOOD, ED ([facility], [facility], [facility] ONLY)    EKG None  Radiology DG Chest Port 1 View  Result Date: 11/07/2019 CLINICAL DATA:  Shortness of breath. EXAM: PORTABLE CHEST 1 VIEW COMPARISON:  April 18, 2019 FINDINGS: New airspace opacities in the lateral right lung base. Low lung volumes. No visible pleural effusions or pneumothorax. Mild enlargement of the cardiac silhouette. No acute osseous abnormality. IMPRESSION: 1. New airspace opacities in the lateral right lung base, compatible with pneumonia. 2. Mild cardiomegaly. Electronically Signed   By: Feliberto Harts MD   On: 11/07/2019 13:10    Procedures Procedures (including critical care time)  Medications Ordered in ED Medications  ketorolac (TORADOL) 15 MG/ML injection 15 mg (has no administration in time range)  HYDROmorphone (DILAUDID) injection 2 mg (has no administration in time range)  HYDROmorphone (DILAUDID) injection 2 mg (has no administration  in time range)  ondansetron (ZOFRAN) injection 4 mg (has no administration in time range)  0.45 % sodium chloride infusion (has no administration in time range)  vancomycin (VANCOCIN) IVPB 1000 mg/200 mL premix (has no administration in time range)  piperacillin-tazobactam (ZOSYN) IVPB 3.375 g (has no administration in time range)    ED Course  I have reviewed the triage vital signs and the nursing notes.  Pertinent labs & imaging results that were available during my care of the patient were reviewed by me and considered in my medical decision making (see chart for  details).  2:16 PM Patient in similar condition. Labs notable for mild hyperkalemia, though it is hemolyzed, lactic acidosis 2.5, Negro count 30,000. X-ray reviewed, concerning for pneumonia. Given the totality of her symptoms, SIRS / Sepsis, pneumonia, recent Covid infection, she will require admission.  Antibiotics for healthcare acquired pneumonia coverage have been provided.   Final Clinical Impression(s) / ED Diagnoses Final diagnoses:  HCAP (healthcare-associated pneumonia)  Sepsis without acute organ dysfunction, due to unspecified organism [facility](HCC)   MDM Number of Diagnoses or Management Options HCAP (healthcare-associated pneumonia): new, needed workup Sepsis without acute organ dysfunction, due to unspecified organism [facility](HCC): new, needed workup   Amount and/or Complexity of Data Reviewed Clinical lab tests: reviewed Tests in the radiology section of CPT: reviewed Tests in the medicine section of CPT: reviewed Decide to obtain previous medical records or to obtain history from someone other than the patient: yes Review and summarize past medical records: yes Discuss the patient with other providers: yes Independent visualization of images, tracings, or specimens: yes  Risk of Complications, Morbidity, and/or Mortality Presenting problems: minimal Diagnostic procedures: minimal Management options: minimal  Critical Care Total time providing critical care: 30-74 minutes (35)  Patient Progress Patient progress: stable    Gerhard MunchLockwood, Bailea Beed, MD 11/07/19 1418

## 2019-11-07 NOTE — ED Notes (Signed)
3 RN's have been unsuccessful to start peripheral IV. IV consult bedside. IV team RN aware Dr. Jeraldine Loots is fine with a midline.

## 2019-11-07 NOTE — H&P (Addendum)
H&P  Patient Demographics:  Stephanie Burch, is a 39 y.o. female  MRN: [id number]   DOB - [birth year]  Admit Date - 11/07/2019  Outpatient Primary MD for the patient is Kallie LocksStroud, [name], FNP  Chief Complaint  Patient presents with  . Chest Pain  . Sickle Cell Pain Crisis  . Shortness of Breath      HPI:   Stephanie StarchKimaada Denslow  is a 39 y.o. female with a medical history signifcant for sickle cell disease type Sehili, chronic pain syndrome, opiate dependence and tolerance, history of anemia of chronic disease, and depression and anxiety presented to ER with complaints of sickle cell pain, chest pain, and shortness of breath. Patient say that pain has been increased to low back and lower extremities over the past several days that is consistent with her typical pain crisis. However,  patient has been having increased chest pain and shortness of breath over the past 24 hours that were worsened with exertion. Patient was diagnosed with COVID 19 infection on 10/20/2019 and was scheduled to received monoclonal antibody this am. She says that shortness of breath increased while at the clinic. Patient says that pain to low back and lower extremities is constant and sharp. She rates pain as 10/10.  She endorses fatigue, headache, and shortness of breath.  She also endorses some chest pain.  She denies blurred vision, dizziness, urinary symptoms, nausea, vomiting, or diarrhea.  ER Course:  Vital signs show BP 121/76   Pulse (!) 128   Temp 99.6 F (37.6 C) (Oral)   Resp (!) 29   Ht 5\' 5"  (1.651 m)   Wt 127 kg   SpO2 99%   BMI 46.59 kg/m . Lactic acid elevated at 2.5. Code sepsis, patient received Aztreonam x1, while in ER and vancomycin and cefepime per pharmacy consult. Chest xray shows new airspace opacities in the lateral right lung base, compatible with pneumonia and mild cardiomegaly.  WBCs markedly elevated at 30.5.  Hemoglobin is 9.7, consistent with patient's baseline.  Absolute reticulocytes 335.  Sodium  146, potassium 5.7, chloride 113.  Creatinine 1.08.  AST 78, ALT 92, and total bilirubin 3.4.    Patient admitted to telemetry for management of hospital-acquired pneumonia in the setting of sickle cell pain crisis.    Review of systems:  In addition to the HPI above, patient reports Review of Systems  Constitutional: Positive for malaise/fatigue. Negative for chills and fever.  HENT: Negative for sore throat.   Eyes: Negative.   Respiratory: Positive for cough and shortness of breath.   Cardiovascular: Positive for chest pain. Negative for palpitations and leg swelling.  Gastrointestinal: Positive for nausea.  Genitourinary: Negative for dysuria, frequency and hematuria.  Musculoskeletal: Positive for back pain and joint pain.  Skin: Negative.   Neurological: Positive for headaches.  Psychiatric/Behavioral: Negative.     A full 10 point Review of Systems was done, except as stated above, all other Review of Systems were negative.  With Past History of the following :   Past Medical History:  Diagnosis Date  . HSV infection   . Nausea   . Sickle cell anemia (HCC)   . Vitamin B12 deficiency 12/2018  . Vitamin D deficiency       History reviewed. No pertinent surgical history.   Social History:   Social History   Tobacco Use  . Smoking status: Never Smoker  . Smokeless tobacco: Never Used  Substance Use Topics  . Alcohol use: Not Currently  Lives - At home   Family History :   Family History  Problem Relation Age of Onset  . Hypertension Mother   . Sickle cell trait Mother   . Diabetes Father   . Sickle cell trait Father      Home Medications:   Prior to Admission medications   Medication Sig Start Date End Date Taking? Authorizing Provider  DULoxetine (CYMBALTA) 20 MG capsule Take 20 mg by mouth daily. Patient not taking: Reported on 08/11/2019    [provider]  folic acid (FOLVITE) 1 MG tablet Take 1 tablet (1 mg total) by mouth daily.  11/15/18   Kallie Locks, FNP  hydroxyurea (HYDREA) 500 MG capsule Take 2,000 mg by mouth daily. May take with food to minimize GI side effects. Patient not taking: Reported on 08/11/2019    [provider]  ibuprofen (ADVIL) 800 MG tablet Take 1 tablet (800 mg total) by mouth every 8 (eight) hours as needed for headache, mild pain, moderate pain or cramping. 08/15/19   Kallie Locks, FNP  naloxone [facility]) 0.4 MG/ML injection As needed 09/14/18   Kallie Locks, FNP  ondansetron (ZOFRAN) 4 MG tablet TAKE 1 TABLET BY MOUTH EVERY 8 HOURS AS NEEDED FOR NAUSEA AND VOMITING Patient taking differently: Take 4 mg by mouth every 8 (eight) hours as needed for nausea or vomiting.  07/14/19   Kallie Locks, FNP  oxyCODONE ER [facility]) 18 MG C12A Take 18 mg by mouth every 12 (twelve) hours. 09/29/19   Kallie Locks, FNP  Oxycodone HCl 20 MG TABS Take 1 tablet (20 mg total) by mouth every 4 (four) hours as needed (for pain). 10/21/19   Kallie Locks, FNP  tiZANidine (ZANAFLEX) 4 MG capsule Take 1 capsule (4 mg total) by mouth 3 (three) times daily as needed for muscle spasms. 10/07/19 05/04/20  Kallie Locks, FNP  valACYclovir (VALTREX) 500 MG tablet Take Valtrex 500 mg daily for suppression of outbreaks. Take Valtrex 1000 mg daily X 1 week for outbreaks. 10/20/19   Kallie Locks, FNP  Vitamin D, Ergocalciferol, (DRISDOL) 1.25 MG (50000 UT) CAPS capsule Take 1 capsule (50,000 Units total) by mouth every 7 (seven) days. 12/21/18   Kallie Locks, FNP  voxelotor (OXBRYTA) 500 MG TABS tablet Take 1,500 mg by mouth daily. For Sickle Cell Anemia    [provider]     Allergies:   No Known Allergies   Physical Exam:   Vitals:   Vitals:   11/07/19 1300 11/07/19 1443  BP: 119/85 121/76  Pulse: (!) 131 (!) 128  Resp: (!) 36 (!) 29  Temp:    SpO2: 99% 99%   Physical Exam Constitutional:      Appearance: She is obese. She is ill-appearing.  HENT:      Mouth/Throat:     Mouth: Mucous membranes are dry.  Eyes:     Pupils: Pupils are equal, round, and reactive to light.  Cardiovascular:     Rate and Rhythm: Regular rhythm. Tachycardia present.     Heart sounds: Normal heart sounds.  Pulmonary:     Effort: Tachypnea present.     Breath sounds: Decreased breath sounds present. No wheezing or rhonchi.  Abdominal:     General: Bowel sounds are normal.     Palpations: Abdomen is soft.  Musculoskeletal:        General: Normal range of motion.  Skin:    General: Skin is warm.  Neurological:  General: No focal deficit present.     Mental Status: She is disoriented.  Psychiatric:        Mood and Affect: Mood normal. Mood is not anxious.        Behavior: Behavior normal.       Data Review:   CBC Recent Labs  Lab 11/07/19 1210  WBC 30.5*  HGB 9.7*  HCT 32.1*  PLT 194  MCV 84.9  MCH 25.7*  MCHC 30.2  RDW 26.2*  LYMPHSABS 7.6*  MONOABS 1.9*  EOSABS 0.0  BASOSABS 0.1   ------------------------------------------------------------------------------------------------------------------  Chemistries  Recent Labs  Lab 11/07/19 1210  NA 146*  K 5.7*  CL 113*  CO2 21*  GLUCOSE 122*  BUN 17  CREATININE 1.08*  CALCIUM 8.7*  AST 78*  ALT 92*  ALKPHOS 76  BILITOT 3.4*   ------------------------------------------------------------------------------------------------------------------ estimated creatinine clearance is 96.6 mL/min (A) (by C-G formula based on SCr of 1.08 mg/dL (H)). ------------------------------------------------------------------------------------------------------------------ No results for input(s): TSH, T4TOTAL, T3FREE, THYROIDAB in the last 72 hours.  Invalid input(s): FREET3  Coagulation profile No results for input(s): INR, PROTIME in the last 168 hours. ------------------------------------------------------------------------------------------------------------------- Recent Labs     11/07/19 1210  DDIMER 10.00*   -------------------------------------------------------------------------------------------------------------------  Cardiac Enzymes No results for input(s): CKMB, TROPONINI, MYOGLOBIN in the last 168 hours.  Invalid input(s): CK ------------------------------------------------------------------------------------------------------------------ No results found for: BNP  ---------------------------------------------------------------------------------------------------------------  Urinalysis    Component Value Date/Time   COLORURINE YELLOW 06/26/2019 1229   APPEARANCEUR HAZY (A) 06/26/2019 1229   LABSPEC 1.013 06/26/2019 1229   PHURINE 6.0 06/26/2019 1229   GLUCOSEU NEGATIVE 06/26/2019 1229   HGBUR NEGATIVE 06/26/2019 1229   BILIRUBINUR NEGATIVE 06/26/2019 1229   BILIRUBINUR Negative 04/27/2019 0935   KETONESUR NEGATIVE 06/26/2019 1229   PROTEINUR NEGATIVE 06/26/2019 1229   UROBILINOGEN 1.0 04/27/2019 0935   NITRITE NEGATIVE 06/26/2019 1229   LEUKOCYTESUR NEGATIVE 06/26/2019 1229    ----------------------------------------------------------------------------------------------------------------   Imaging Results:    DG Chest Port 1 View  Result Date: 11/07/2019 CLINICAL DATA:  Shortness of breath. EXAM: PORTABLE CHEST 1 VIEW COMPARISON:  April 18, 2019 FINDINGS: New airspace opacities in the lateral right lung base. Low lung volumes. No visible pleural effusions or pneumothorax. Mild enlargement of the cardiac silhouette. No acute osseous abnormality. IMPRESSION: 1. New airspace opacities in the lateral right lung base, compatible with pneumonia. 2. Mild cardiomegaly. Electronically Signed   By: Feliberto Harts MD   On: 11/07/2019 13:10     Assessment & Plan:  Principal Problem:   Sickle cell pain crisis (HCC) Active Problems:   Leukocytosis   Tachycardia with heart rate 100-120 beats per minute   Chronic pain syndrome   Anxiety    Hospital-acquired pneumonia   Sepsis (HCC)   Acute kidney injury (HCC)   History of COVID-19   Hyperkalemia    Sickle cell pain crisis:  Admit to telemetry.  Continue IV fluids, 0.45% saline at 100 mL/h. PCA Dilaudid with settings of 0.6 mg, 10-minute lockout, and 4 mg/h with 1 mg loading dose. Dilaudid 2 mg every 2 hours as needed for severe breakthrough pain while in ER. Hold Toradol due to acute kidney injury Continue home medications OxyContin 20 mg every 12 hours Monitor vital signs closely, reevaluate pain scale regularly, and supplemental oxygen as needed. Pain will be reevaluated in context of function and relationship to baseline as care progresses. Hospital associated pneumonia: Chest x-ray shows new airspace opacities in the lateral right lung base that are compatible with pneumonia.  Patient received aztreonam x1.  Continue broad-spectrum antibiotics.  Cefepime and vancomycin per pharmacy consult. 2 L supplemental oxygen, incentive spirometry.  Maintain oxygen saturation above 90%. Tylenol 650 mg every 4 hours as needed for fever  Leukocytosis: WBCs 30.5, markedly elevated.  Broad-spectrum antibiotics initiated.  History of positive COVID-19 infection on 10/20/2019. Urine culture and blood culture pending. Follow CBC in a.m.   Sickle cell anemia: Hemoglobin is 9.7, consistent with patient's baseline.  There is no clinical indication for blood transfusion at this time.  Continue to monitor closely.   Chronic pain syndrome: Continue OxyContin 20 mg every 12 hours.  Hold oxycodone, use IV Dilaudid.   Sepsis:  Lactic acid elevated at 2.5.  Chest x-ray positive for hospital-acquired pneumonia.  Heart rate 110s-120s, continue cardiac monitoring. Broad-spectrum antibiotics initiated.  IV cefepime and IV vancomycin per pharmacy consult Continue IV fluids  Acute kidney injury:  Creatinine 1.08.  Patient appears to be dehydrated.  Gentle hydration continued, 0.45% saline at  1 mL/h.  Avoid all nephrotoxins.  BMP in a.m.  Transaminitis: Transaminases elevated above baseline.  More than likely secondary to vaso-occlusive pain crisis.  Continue to monitor closely.  Hepatic function panel in a.m.  Depression and anxiety: Continue Cymbalta 20 mg daily.  Patient denies any suicidal or homicidal ideations    DVT Prophylaxis: Subcut Lovenox   AM Labs Ordered, also please review Full Orders  Family Communication: Admission, patient's condition and plan of care including tests being ordered have been discussed with the patient who indicate understanding and agree with the plan and Code Status.  Code Status: Full Code  Consults called: None    Admission status: Inpatient    Time spent in minutes : 50 minutes  Nolon Nations  APRN, MSN, FNP-C Patient Care [facility] [address] [place], [place] [address] [phone]  11/07/2019 at 2:55 PM

## 2019-11-07 NOTE — ED Notes (Signed)
Phlebotomy @ bedside  

## 2019-11-07 NOTE — ED Triage Notes (Signed)
Patient states she was Covid + 2 weeks ago. Patient states she is past quarantine time. Patient c/o chest pain and SOB. Patient also c/o sickle cell pain in both legs.

## 2019-11-07 NOTE — ED Notes (Signed)
CRITICAL VALUE STICKER  CRITICAL VALUE: Lactic Acid 2.5  RECEIVER (on-site recipient of call):Csimpson, RN   DATE & TIME NOTIFIED: 11/07/19 1300  MESSENGER (representative from lab): Tammy  MD NOTIFIED: Jeraldine Loots  TIME OF NOTIFICATION: 1301  RESPONSE: See orders

## 2019-11-07 NOTE — Progress Notes (Signed)
Pharmacy Antibiotic Note  Stephanie Burch is a 39 y.o. female admitted on 11/07/2019 with pneumonia.  She has hospitalized 9/11>>9/18 with sickle cell pain crisis.  Found to be COVID+ during this admission.  CXR + PNA today- no CXR done during previous admit as patient mostly asymptomatic from COVID standpoint.  Pharmacy has been consulted for Cefepime + Vancomycin dosing.  11/07/2019: -PCT not significantly elevated- 0.23 -WBC/LA elevated -Scr slightly elevated above patient's baseline (<0.8) but NCrCl still~ 17ml/min.   Plan: Cefepime 2gm IV q8h Vancomycin 2gm IV x1 then 1gm IV q12h to target Vanc trough 15-24mcg/ml Monitor renal function and cx data  Check MRSA PCR - consider d/c Vancomycin if negative Check Vancomycin levels at steady state as indicated  Height: 5\' 5"  (165.1 cm) Weight: 127 kg (280 lb) IBW/kg (Calculated) : 57  Temp (24hrs), Avg:99.6 F (37.6 C), Min:99.6 F (37.6 C), Max:99.6 F (37.6 C)  Recent Labs  Lab 11/07/19 1210  WBC 30.5*  CREATININE 1.08*  LATICACIDVEN 2.5*    Estimated Creatinine Clearance: 96.6 mL/min (A) (by C-G formula based on SCr of 1.08 mg/dL (H)).    No Known Allergies  Antimicrobials this admission: 10/4 Vancomycin >>  10/4 Cefepime >>   Dose adjustments this admission:  Microbiology results: 10/4 BCx:  10/4 MRSA PCR:   Thank you for allowing pharmacy to be a part of this patients care.  12/4 PharmD, BCPS 11/07/2019 2:52 PM

## 2019-11-07 NOTE — ED Notes (Signed)
Dr. Jeraldine Loots aware pts HR 132. Dr. Jeraldine Loots aware pt is difficult IV stick and will have another RN try.

## 2019-11-07 NOTE — ED Notes (Signed)
Phlebotomy called for assistance with labs due.

## 2019-11-07 NOTE — Progress Notes (Signed)
A consult was received from an ED physician for Axtreonam per pharmacy dosing.  The patient's profile has been reviewed for ht/wt/allergies/indication/available labs.    Patient has no documented drug allergies.  A one time order has been placed for Cefepime 2gm IV.  Further antibiotics/pharmacy consults should be ordered by admitting physician if indicated.                       Thank you, Junita Push PharmD, BCPS 11/07/2019  2:31 PM

## 2019-11-07 NOTE — ED Notes (Signed)
IV consult able to place 1 peripheral IV, per IV consult RN- not able to place midline.

## 2019-11-08 DIAGNOSIS — D5700 Hb-SS disease with crisis, unspecified: Secondary | ICD-10-CM | POA: Diagnosis not present

## 2019-11-08 LAB — BASIC METABOLIC PANEL
Anion gap: 10 (ref 5–15)
BUN: 19 mg/dL (ref 6–20)
CO2: 21 mmol/L — ABNORMAL LOW (ref 22–32)
Calcium: 8.3 mg/dL — ABNORMAL LOW (ref 8.9–10.3)
Chloride: 112 mmol/L — ABNORMAL HIGH (ref 98–111)
Creatinine, Ser: 1.09 mg/dL — ABNORMAL HIGH (ref 0.44–1.00)
GFR calc Af Amer: >60 mL/min (ref >60)
GFR calc non Af Amer: >60 mL/min (ref >60)
Glucose, Bld: 148 mg/dL — ABNORMAL HIGH (ref 70–99)
Potassium: 3.7 mmol/L (ref 3.5–5.1)
Sodium: 143 mmol/L (ref 135–145)

## 2019-11-08 LAB — CBC
HCT: 25.4 % — ABNORMAL LOW (ref 36.0–46.0)
Hemoglobin: 7.9 g/dL — ABNORMAL LOW (ref 12.0–15.0)
MCH: 25.9 pg — ABNORMAL LOW (ref 26.0–34.0)
MCHC: 31.1 g/dL (ref 30.0–36.0)
MCV: 83.3 fL (ref 80.0–100.0)
Platelets: 189 10*3/uL (ref 150–400)
RBC: 3.05 MIL/uL — ABNORMAL LOW (ref 3.87–5.11)
RDW: 23.9 % — ABNORMAL HIGH (ref 11.5–15.5)
WBC: 27.1 10*3/uL — ABNORMAL HIGH (ref 4.0–10.5)
nRBC: 31.6 % — ABNORMAL HIGH (ref 0.0–0.2)

## 2019-11-08 LAB — HEPATIC FUNCTION PANEL
ALT: 67 U/L — ABNORMAL HIGH (ref 0–44)
AST: 48 U/L — ABNORMAL HIGH (ref 15–41)
Albumin: 2.8 g/dL — ABNORMAL LOW (ref 3.5–5.0)
Alkaline Phosphatase: 66 U/L (ref 38–126)
Bilirubin, Direct: 1 mg/dL — ABNORMAL HIGH (ref 0.0–0.2)
Indirect Bilirubin: 1.1 mg/dL — ABNORMAL HIGH (ref 0.3–0.9)
Total Bilirubin: 2.1 mg/dL — ABNORMAL HIGH (ref 0.3–1.2)
Total Protein: 7.2 g/dL (ref 6.5–8.1)

## 2019-11-08 LAB — ABO/RH: ABO/RH(D): B POS

## 2019-11-08 LAB — PREPARE RBC (CROSSMATCH)

## 2019-11-08 MED ORDER — ACETAMINOPHEN 325 MG PO TABS
650.0000 mg | ORAL_TABLET | Freq: Once | ORAL | Status: AC
  Administered 2019-11-08: 650 mg via ORAL

## 2019-11-08 MED ORDER — DIPHENHYDRAMINE HCL 50 MG/ML IJ SOLN: 25.0000 mg | Freq: Once | INTRAMUSCULAR | Status: DC

## 2019-11-08 MED ORDER — HYDROMORPHONE HCL 1 MG/ML IJ SOLN
2.0000 mg | Freq: Once | INTRAMUSCULAR | Status: AC
  Administered 2019-11-08: 2 mg via INTRAVENOUS
  Filled 2019-11-08: qty 2

## 2019-11-08 MED ORDER — SODIUM CHLORIDE 0.45 % IV SOLN: INTRAVENOUS | Status: AC

## 2019-11-08 MED ORDER — KETOROLAC TROMETHAMINE 30 MG/ML IJ SOLN
15.0000 mg | Freq: Once | INTRAMUSCULAR | Status: AC
  Administered 2019-11-08: 15 mg via INTRAVENOUS
  Filled 2019-11-08: qty 1

## 2019-11-08 MED ORDER — SODIUM CHLORIDE 0.9% IV SOLUTION: Freq: Once | INTRAVENOUS | Status: AC

## 2019-11-08 MED ORDER — ALBUTEROL SULFATE (2.5 MG/3ML) 0.083% IN NEBU: 2.5000 mg | INHALATION_SOLUTION | RESPIRATORY_TRACT | Status: DC

## 2019-11-08 MED ORDER — ALBUTEROL SULFATE HFA 108 (90 BASE) MCG/ACT IN AERS
4.0000 | INHALATION_SPRAY | RESPIRATORY_TRACT | Status: DC
  Administered 2019-11-08 (×3): 4 via RESPIRATORY_TRACT
  Filled 2019-11-08: qty 6.7

## 2019-11-08 NOTE — Progress Notes (Signed)
   11/08/19 1116  Assess: MEWS Score  Temp (!) 101 F (38.3 C)  BP (!) 143/80  Pulse Rate (!) 139  Resp (!) 21  Level of Consciousness Alert  SpO2 94 %  O2 Device Nasal Cannula  O2 Flow Rate (L/min) 2 L/min  Assess: MEWS Score  MEWS Temp 1  MEWS Systolic 0  MEWS Pulse 3  MEWS RR 1  MEWS LOC 0  MEWS Score 5  MEWS Score Color Red

## 2019-11-08 NOTE — Plan of Care (Signed)
°  Problem: Education: Goal: Knowledge of General Education information will improve Description: Including pain rating scale, medication(s)/side effects and non-pharmacologic comfort measures 11/08/2019 0440 by Eliezer Bottom, RN Outcome: Progressing 11/08/2019 0440 by Eliezer Bottom, RN Outcome: Progressing   Problem: Pain Managment: Goal: General experience of comfort will improve 11/08/2019 0440 by Eliezer Bottom, RN Outcome: Progressing 11/08/2019 0440 by Eliezer Bottom, RN Outcome: Progressing

## 2019-11-08 NOTE — Progress Notes (Signed)
RN called [place] Hollis FNP to update on pt's current condition. Pt's MEWS changed to a red 6. Pt had Tylenol PO approximately 1 hour ago for a temp of 100.1 and is now 101.6. [place] reports that she will place orders for pt to go to step-down unit. RN will continue to monitor. Consulting civil engineer, Anderson Malta. Made aware of pt's condition and MEWS.    11/08/19 1530  Assess: MEWS Score  Temp (!) 101.6 F (38.7 C)  BP 129/83  Pulse Rate (!) 138  Assess: MEWS Score  MEWS Temp 2  MEWS Systolic 0  MEWS Pulse 3  MEWS RR 1  MEWS LOC 0  MEWS Score 6  MEWS Score Color Red

## 2019-11-08 NOTE — Progress Notes (Signed)
   11/08/19 0957  Assess: MEWS Score  Temp (!) 101.2 F (38.4 C)  BP 129/75  Pulse Rate (!) 135  Resp (!) 23  SpO2 98 %  Assess: MEWS Score  MEWS Temp 1  MEWS Systolic 0  MEWS Pulse 3  MEWS RR 1  MEWS LOC 0  MEWS Score 5  MEWS Score Color Red  Assess: if the MEWS score is Yellow or Red  Were vital signs taken at a resting state? Yes  Focused Assessment Change from prior assessment (see assessment flowsheet)  Early Detection of Sepsis Score *See Row Information* Medium  MEWS guidelines implemented *See Row Information* Yes  Treat  MEWS Interventions Administered prn meds/treatments  Pain Scale 0-10  Pain Score 8  Pain Type Acute pain  Pain Location Generalized  Pain Orientation Right;Left  Pain Descriptors / Indicators Constant  Pain Frequency Constant  Pain Onset On-going  Patients Stated Pain Goal 3  Pain Intervention(s) Medication (See eMAR);MD notified (Comment);RN made aware Julianne Handler, FNP notified)  Multiple Pain Sites Yes  Take Vital Signs  Increase Vital Sign Frequency  Red: Q 1hr X 4 then Q 4hr X 4, if remains red, continue Q 4hrs  Escalate  MEWS: Escalate Red: discuss with charge nurse/RN and provider, consider discussing with RRT Anderson Malta., charge RN)  Notify: Charge Nurse/RN  Name of Charge Nurse/RN Notified Gae Dry., RN  Date Charge Nurse/RN Notified 11/08/19  Time Charge Nurse/RN Notified 1005  Notify: Provider  Provider Name/Title Julianne Handler, FNP  Date Provider Notified 11/08/19  Time Provider Notified 1005  Notification Type Call  Notification Reason Other (Comment) (change in VS's and change in MEWS)  Response See new orders  Date of Provider Response 11/08/19  Time of Provider Response 1015  Document  Patient Outcome Other (Comment) (RN monitoring pt frequently)  Progress note created (see row info) Yes

## 2019-11-08 NOTE — Progress Notes (Addendum)
10/05 1956- Blood bank calling to inform RN that patient has antibodies and will need names of hospitals where previous care was received and extensive typing performed.  Blood will not be ready for hours.  2056- Stephanie Burch (patient's contact) called to inquire on hospitals patient has received care. (patient is slightly sedated on PCA dilaudid).  Ms. Sharol Given believes one of the hospitals is "[facility]". Blood bank, Darl Pikes, notified.  2130- Called ICU/SD to give report and was notified by unit secretary that. " Bed board retracted that bed for her. We have no bed available for that patient. Everyone was notified."  Patient remains in room [phone].  Pam, Charge nurse notified of bed status.

## 2019-11-08 NOTE — Progress Notes (Signed)
   11/08/19 1721  Assess: MEWS Score  Temp 99.7 F (37.6 C)  BP 127/62  Pulse Rate (!) 124  Resp 20  SpO2 97 %  Assess: MEWS Score  MEWS Temp 0  MEWS Systolic 0  MEWS Pulse 2  MEWS RR 0  MEWS LOC 0  MEWS Score 2  MEWS Score Color Yellow

## 2019-11-08 NOTE — Progress Notes (Signed)
RN called Julianne Handler, FNP regarding pt turning to a red mews due to HR of 135, RR 23, Temp 101.2. This RN administered PO tylenol per orders. Charge RN, Gae Dry. Made aware of red mews.

## 2019-11-08 NOTE — Progress Notes (Signed)
Subjective: Stephanie Burch is a 39 year old female with a medical history significant for sickle cell disease type [place], chronic pain syndrome, opiate dependence and tolerance, history of anemia of chronic disease, and depression was admitted for HCAP in the setting of sickle cell pain crisis.   Patient has been febrile overnight, max temperature 101.2.  Also, tachycardia, heart rate 120s-140s.  Temperature uncontrolled by acetaminophen.  Patient continues to complain of significant pain primarily to low back and lower extremities.  Pain intensity is 10/10.  Patient states that she is unable to find a comfortable position.  Also, patient endorses shortness of breath.  Oxygen saturation is 94% on 2 L supplemental oxygen.  Patient states that she feels miserable today.  Objective:  Vital signs in last 24 hours:  Vitals:   11/08/19 1425 11/08/19 1436 11/08/19 1440 11/08/19 1530  BP: 103/66 129/69  129/83  Pulse: (!) 140 (!) 138  (!) 138  Resp: (!) 32 (!) 23 (!) 23   Temp: (!) 101.2 F (38.4 C) 100.1 F (37.8 C)  (!) 101.6 F (38.7 C)  TempSrc: Oral Oral  Oral  SpO2: 92% 93% 100%   Weight:      Height:        Intake/Output from previous day:   Intake/Output Summary (Last 24 hours) at 11/08/2019 1647 Last data filed at 11/08/2019 1500 Gross per 24 hour  Intake 2256.52 ml  Output 300 ml  Net 1956.52 ml    Lab Results:  Basic Metabolic Panel:    Component Value Date/Time   NA 143 11/08/2019 0452   NA 142 09/11/2017 1431   K 3.7 11/08/2019 0452   CL 112 (H) 11/08/2019 0452   CO2 21 (L) 11/08/2019 0452   BUN 19 11/08/2019 0452   BUN 8 09/11/2017 1431   CREATININE 1.09 (H) 11/08/2019 0452   GLUCOSE 148 (H) 11/08/2019 0452   CALCIUM 8.3 (L) 11/08/2019 0452   CBC:    Component Value Date/Time   WBC 27.1 (H) 11/08/2019 0452   HGB 7.9 (L) 11/08/2019 0452   HGB 13.0 09/11/2017 1431   HCT 25.4 (L) 11/08/2019 0452   HCT 38.4 09/11/2017 1431   PLT 189 11/08/2019 0452   PLT 258  09/11/2017 1431   MCV 83.3 11/08/2019 0452   MCV 75 (L) 09/11/2017 1431   NEUTROABS 20.6 (H) 11/07/2019 1210   NEUTROABS 3.9 09/11/2017 1431   LYMPHSABS 7.6 (H) 11/07/2019 1210   LYMPHSABS 2.2 09/11/2017 1431   MONOABS 1.9 (H) 11/07/2019 1210   EOSABS 0.0 11/07/2019 1210   EOSABS 0.1 09/11/2017 1431   BASOSABS 0.1 11/07/2019 1210   BASOSABS 0.0 09/11/2017 1431    Recent Results (from the past 240 hour(s))  Blood Culture (routine x 2)     Status: None (Preliminary result)   Collection Time: 11/07/19 11:50 AM   Specimen: BLOOD  Result Value Ref Range Status   Specimen Description   Final    BLOOD RIGHT ANTECUBITAL Performed at [facility], [address] [place], [place] [address]    Special Requests   Final    BOTTLES DRAWN AEROBIC AND ANAEROBIC Blood Culture adequate volume Performed at [facility], [address] [place], [place] [address]    Culture   Final    NO GROWTH < 24 HOURS Performed at [facility], [address] [place], [place] [address]    Report Status PENDING  Incomplete  Blood Culture (routine x 2)  Status: None (Preliminary result)   Collection Time: 11/07/19  4:55 PM   Specimen: BLOOD RIGHT HAND  Result Value Ref Range Status   Specimen Description   Final    BLOOD RIGHT HAND Performed at [facility], [address], [place], [place] [address]    Special Requests   Final    BOTTLES DRAWN AEROBIC ONLY Blood Culture adequate volume Performed at [facility], [address] [place], [place] [address]    Culture   Final    NO GROWTH < 24 HOURS Performed at [facility], [address], [place], [place] [address]    Report Status PENDING  Incomplete  Culture, blood (single)     Status: None (Preliminary result)   Collection Time: 11/07/19  4:55 PM   Specimen: BLOOD LEFT HAND  Result Value Ref Range Status   Specimen Description   Final    BLOOD LEFT HAND Performed  at [facility], [address], [place], [place] [address]    Special Requests   Final    BOTTLES DRAWN AEROBIC ONLY Blood Culture adequate volume Performed at [facility], [address] [place], [place] [address]    Culture   Final    NO GROWTH < 24 HOURS Performed at [facility], [address], [place], [place] [address]    Report Status PENDING  Incomplete    Physical Exam Constitutional:      Appearance: She is well-developed.  Eyes:     Pupils: Pupils are equal, round, and reactive to light.  Cardiovascular:     Rate and Rhythm: Normal rate and regular rhythm.     Heart sounds: Normal heart sounds.  Pulmonary:     Effort: Pulmonary effort is normal.     Breath sounds: Normal breath sounds.  Abdominal:     General: Bowel sounds are normal.  Musculoskeletal:        General: Normal range of motion.  Skin:    General: Skin is warm.  Neurological:     General: No focal deficit present.     Mental Status: She is alert. She is disoriented.  Psychiatric:        Mood and Affect: Mood normal.     Studies/Results: DG Chest Port 1 View  Result Date: 11/07/2019 CLINICAL DATA:  Shortness of breath. EXAM: PORTABLE CHEST 1 VIEW COMPARISON:  April 18, 2019 FINDINGS: New airspace opacities in the lateral right lung base. Low lung volumes. No visible pleural effusions or pneumothorax. Mild enlargement of the cardiac silhouette. No acute osseous abnormality. IMPRESSION: 1. New airspace opacities in the lateral right lung base, compatible with pneumonia. 2. Mild cardiomegaly. Electronically Signed   By: Feliberto Harts MD   On: 11/07/2019 13:10    Medications: Scheduled Meds:  albuterol  4 puff Inhalation Q4H   DULoxetine  20 mg Oral Daily   enoxaparin (LOVENOX) injection  40 mg Subcutaneous Q24H   folic acid  1 mg Oral Daily   HYDROmorphone   Intravenous Q4H   hydroxyurea  2,000 mg Oral Daily   ketorolac  15 mg Intravenous  Once   oxyCODONE  20 mg Oral Q12H   senna-docusate  1 tablet Oral BID   valACYclovir  500 mg Oral BID   Vitamin D (Ergocalciferol)  50,000 Units Oral Q7 days   voxelotor  1,500 mg Oral Daily   Continuous Infusions:  sodium chloride     ceFEPime (MAXIPIME) IV  diphenhydrAMINE     vancomycin 1,000 mg (11/08/19 1557)   PRN Meds:.acetaminophen, diphenhydrAMINE **OR** diphenhydrAMINE, naloxone **AND** sodium chloride flush, ondansetron (ZOFRAN) IV, polyethylene glycol, tiZANidine  Consultants:  None  Procedures:  None  Antibiotics:  None  Assessment/Plan: Principal Problem:   Sickle cell pain crisis (HCC) Active Problems:   Leukocytosis   Tachycardia with heart rate 100-120 beats per minute   Chronic pain syndrome   Anxiety   HCAP (healthcare-associated pneumonia)   Sepsis (HCC)   Acute kidney injury (HCC)   History of COVID-19   Hyperkalemia  HCAP:  Most recent chest x-ray shows new airspace opacities and the lateral right lung base that are compatible with pneumonia.  Patient continued on IV cefepime and vancomycin per pharmacy consult. Oxygen saturation is currently 94% on 2 L supplemental oxygen, will continue.  Will titrate oxygen as tolerated. Tylenol 650 mg every 4 hours as needed for fever. Continue incentive spirometer.  Sickle cell disease with pain crisis: Continue IV fluids, 0.45% saline at 100 mL/h.  Patient has not maximized IV Dilaudid PCA, she only used a total of 8 mg over the past 4 hours.  Patient encouraged to use PCA for greater pain control. Monitor vital signs very closely, reevaluate pain scale regularly, and supplemental oxygen as needed.  Leukocytosis: WBCs improving.  Continue IV antibiotics.  Urine culture pending.  Blood cultures negative at day 1. Follow CBC in a.m.  Sickle cell anemia: Hemoglobin has decreased to 7.9.  Considered to be hemodilution.  Decrease IV fluids.  There is no clinical indication for blood transfusion  at this time.  Continue to monitor closely.  Repeat CBC and type and screen in a.m.  If hemoglobin is less than 7.0 g/dL.  Will consider transfusing 1 unit PRBCs.  Chronic pain syndrome: Continue home medications  Sepsis: On admission, lactic acid elevated at 2.5.  HR 120-140s, continue cardiac monitoring.  Continue IV antibiotics.  Also, continue IV fluids.  Continue to monitor closely.  Acute kidney injury: Creatinine mildly elevated at 1.09.  Continue gentle hydration.  Avoid all nephrotoxins.  Follow BMP.  Transaminitis: Improving.  More than likely secondary to vaso-occlusive pain crisis.  Continue to monitor closely.  Depression anxiety: Continue Cymbalta 20 mg daily.  Patient continues to deny any suicidal or homicidal intentions.    Code Status: Full Code Family Communication: N/A Disposition Plan: Not yet ready for discharge  Delmas Faucett Rennis Petty  APRN, MSN, FNP-C [facility] [address] [place], [place] [address] [phone]  If 5PM-8AM, please contact night-coverage.  11/08/2019, 4:47 PM  LOS: 1 day

## 2019-11-08 NOTE — Progress Notes (Signed)
   11/08/19 1312  Vitals  Temp 98 F (36.7 C)  Temp Source Oral  BP (!) 129/56  MAP (mmHg) 78  BP Location Left Wrist  BP Method Automatic  Patient Position (if appropriate) Lying  Pulse Rate (!) 140  Pulse Rate Source Monitor  Resp (!) 28  MEWS COLOR  MEWS Score Color Red  Oxygen Therapy  SpO2 (!) 86 %  MEWS Score  MEWS Temp 0  MEWS Systolic 0  MEWS Pulse 3  MEWS RR 2  MEWS LOC 0  MEWS Score 5

## 2019-11-08 NOTE — Progress Notes (Signed)
   11/08/19 1436  Assess: MEWS Score  Temp 100.1 F (37.8 C)  BP 129/69  Pulse Rate (!) 138  Resp (!) 23  SpO2 93 %  Assess: MEWS Score  MEWS Temp 0  MEWS Systolic 0  MEWS Pulse 3  MEWS RR 1  MEWS LOC 0  MEWS Score 4  MEWS Score Color Red  Assess: if the MEWS score is Yellow or Red  Were vital signs taken at a resting state? Yes  Focused Assessment No change from prior assessment  Early Detection of Sepsis Score *See Row Information* Low  MEWS guidelines implemented *See Row Information* No, vital signs rechecked  Treat  MEWS Interventions Administered scheduled meds/treatments  Pain Scale 0-10  Pain Score 8  Pain Type Acute pain  Pain Location Generalized  Pain Descriptors / Indicators Constant  Pain Frequency Constant  Pain Onset On-going  Patients Stated Pain Goal 3  Pain Intervention(s) Medication (See eMAR)  Multiple Pain Sites Yes  Body Language 1

## 2019-11-08 NOTE — Progress Notes (Signed)
Patient awake and alert, inquired where patient received last blood transfusion.  Patient stated "hospital in [place], [place]." Blood bank notified.

## 2019-11-09 ENCOUNTER — Inpatient Hospital Stay: Payer: Self-pay

## 2019-11-09 DIAGNOSIS — I10 Essential (primary) hypertension: Secondary | ICD-10-CM

## 2019-11-09 DIAGNOSIS — I878 Other specified disorders of veins: Secondary | ICD-10-CM

## 2019-11-09 DIAGNOSIS — D5700 Hb-SS disease with crisis, unspecified: Secondary | ICD-10-CM | POA: Diagnosis not present

## 2019-11-09 LAB — CBC
HCT: 23.5 % — ABNORMAL LOW (ref 36.0–46.0)
Hemoglobin: 7.5 g/dL — ABNORMAL LOW (ref 12.0–15.0)
MCH: 26.1 pg (ref 26.0–34.0)
MCHC: 31.9 g/dL (ref 30.0–36.0)
MCV: 81.9 fL (ref 80.0–100.0)
Platelets: 187 10*3/uL (ref 150–400)
RBC: 2.87 MIL/uL — ABNORMAL LOW (ref 3.87–5.11)
RDW: 22.5 % — ABNORMAL HIGH (ref 11.5–15.5)
WBC: 32 10*3/uL — ABNORMAL HIGH (ref 4.0–10.5)
nRBC: 24.9 % — ABNORMAL HIGH (ref 0.0–0.2)

## 2019-11-09 LAB — CREATININE, SERUM
Creatinine, Ser: 1.25 mg/dL — ABNORMAL HIGH (ref 0.44–1.00)
GFR calc non Af Amer: 55 mL/min — ABNORMAL LOW (ref >60)

## 2019-11-09 LAB — MRSA PCR SCREENING: MRSA by PCR: NEGATIVE

## 2019-11-09 MED ORDER — CHLORHEXIDINE GLUCONATE CLOTH 2 % EX PADS
6.0000 | MEDICATED_PAD | Freq: Every day | CUTANEOUS | Status: DC
  Administered 2019-11-09 – 2019-11-17 (×9): 6 via TOPICAL

## 2019-11-09 MED ORDER — ALBUTEROL SULFATE HFA 108 (90 BASE) MCG/ACT IN AERS
2.0000 | INHALATION_SPRAY | Freq: Three times a day (TID) | RESPIRATORY_TRACT | Status: DC
  Administered 2019-11-09 – 2019-11-10 (×6): 2 via RESPIRATORY_TRACT
  Filled 2019-11-09: qty 6.7

## 2019-11-09 MED ORDER — SODIUM CHLORIDE 0.9% FLUSH
10.0000 mL | INTRAVENOUS | Status: DC | PRN
  Administered 2019-11-17: 10 mL

## 2019-11-09 MED ORDER — METOPROLOL TARTRATE 25 MG PO TABS
12.5000 mg | ORAL_TABLET | Freq: Two times a day (BID) | ORAL | Status: DC
  Administered 2019-11-09 – 2019-11-18 (×18): 12.5 mg via ORAL
  Filled 2019-11-09 (×18): qty 1

## 2019-11-09 NOTE — Progress Notes (Signed)
Called and spoke with M. Katherina Right, FNP concerning lack of IV access for blood administration and requesting prn dose of pain medication so RN can temporarily stop PCA.  Per M. Denny, hold blood until am CBC and if HGB below 7 give blood.

## 2019-11-09 NOTE — Progress Notes (Signed)
11/08/19 7p-7a:  Dilaudid total received 11.6mg    49 attempts  20 doses received.

## 2019-11-09 NOTE — Progress Notes (Signed)
Responded to consult for IV. Pt assessed with [place]. Only appropriate vein for peripheral access is left AC. Discussed findings with RN and pt. Recommend considering alternate form of access for further IV needs.

## 2019-11-09 NOTE — Progress Notes (Signed)
Peripherally Inserted Central Catheter Placement  The IV Nurse has discussed with the patient and/or persons authorized to consent for the patient, the purpose of this procedure and the potential benefits and risks involved with this procedure.  The benefits include less needle sticks, lab draws from the catheter, and the patient may be discharged home with the catheter. Risks include, but not limited to, infection, bleeding, blood clot (thrombus formation), and puncture of an artery; nerve damage and irregular heartbeat and possibility to perform a PICC exchange if needed/ordered by physician.  Alternatives to this procedure were also discussed.  Bard Power PICC patient education guide, fact sheet on infection prevention and patient information card has been provided to patient /or left at bedside.    PICC Placement Documentation  PICC Double Lumen 11/09/19 PICC Right Brachial 42 cm 0 cm (Active)  Indication for Insertion or Continuance of Line Poor Vasculature-patient has had multiple peripheral attempts or PIVs lasting less than 24 hours 11/09/19 1458  Exposed Catheter (cm) 0 cm 11/09/19 1458  Site Assessment Clean;Dry;Intact 11/09/19 1458  Lumen #1 Status Flushed;Blood return noted 11/09/19 1458  Lumen #2 Status Flushed;Blood return noted 11/09/19 1458  Dressing Type Transparent 11/09/19 1458  Dressing Status Clean;Dry;Intact 11/09/19 1458  Antimicrobial disc in place? Yes 11/09/19 1458  Safety Lock Not Applicable 11/09/19 1458  Dressing Intervention New dressing;Other (Comment) 11/09/19 1458  Dressing Change Due 11/16/19 11/09/19 1458       Reginia Forts [place] 11/09/2019, 3:00 PM

## 2019-11-09 NOTE — Progress Notes (Signed)
Subjective: Stephanie Burch is a 39 year old female with a medical history significant for sickle cell disease type [place], chronic pain syndrome, opiate dependence and tolerance, history of anemia of chronic disease, and depression was admitted for HCAP in the setting of sickle cell pain crisis.  Today, patient's hemoglobin is 7.5, which is below her baseline.  Patient has multiple antibodies, there has been difficulty finding compatible blood.  Patient's baseline is 10-11 g/dL.  Patient has been afebrile overnight.  Heart rate remains 120s-140s. Patient is complaining of significant pain primarily to low back and lower extremities.  She is not maximizing IV Dilaudid PCA.  She is a total of 11.6 mg over the past 12 hours.  She rates pain as 10/10.  Patient endorses shortness of breath with exertion.  Current oxygen saturation is 100% on 2 L supplemental oxygen.  Objective:  Vital signs in last 24 hours:  Vitals:   11/09/19 0734 11/09/19 1117 11/09/19 1149 11/09/19 1210  BP: 138/88   136/84  Pulse: (!) 117   (!) 126  Resp: (!) 24 (!) 24 (!) 28 (!) 30  Temp: 99.9 F (37.7 C)   98.5 F (36.9 C)  TempSrc: Oral   Oral  SpO2: 100% 100% 100% 97%  Weight:      Height:        Intake/Output from previous day:   Intake/Output Summary (Last 24 hours) at 11/09/2019 1249 Last data filed at 11/09/2019 0600 Gross per 24 hour  Intake 2082.34 ml  Output --  Net 2082.34 ml    Physical Exam: General: Alert, awake, oriented x3, in no acute distress.  HEENT: [place]/AT PEERL, EOMI Neck: Trachea midline,  no masses, no thyromegal,y no JVD, no carotid bruit OROPHARYNX:  Moist, No exudate/ erythema/lesions.  Heart: Regular rate and rhythm, without murmurs, rubs, gallops, PMI non-displaced, no heaves or thrills on palpation.  Lungs: Clear to auscultation, no wheezing or rhonchi noted. No increased vocal fremitus resonant to percussion  Abdomen: Soft, nontender, nondistended, positive bowel sounds, no masses no  hepatosplenomegaly noted..  Neuro: No focal neurological deficits noted cranial nerves II through XII grossly intact. DTRs 2+ bilaterally upper and lower extremities. Strength 5 out of 5 in bilateral upper and lower extremities. Musculoskeletal: No warm swelling or erythema around joints, no spinal tenderness noted. Psychiatric: Patient alert and oriented x3, good insight and cognition, good recent to remote recall. Lymph node survey: No cervical axillary or inguinal lymphadenopathy noted.  Lab Results:  Basic Metabolic Panel:    Component Value Date/Time   NA 143 11/08/2019 0452   NA 142 09/11/2017 1431   K 3.7 11/08/2019 0452   CL 112 (H) 11/08/2019 0452   CO2 21 (L) 11/08/2019 0452   BUN 19 11/08/2019 0452   BUN 8 09/11/2017 1431   CREATININE 1.09 (H) 11/08/2019 0452   GLUCOSE 148 (H) 11/08/2019 0452   CALCIUM 8.3 (L) 11/08/2019 0452   CBC:    Component Value Date/Time   WBC 32.0 (H) 11/09/2019 0544   HGB 7.5 (L) 11/09/2019 0544   HGB 13.0 09/11/2017 1431   HCT 23.5 (L) 11/09/2019 0544   HCT 38.4 09/11/2017 1431   PLT 187 11/09/2019 0544   PLT 258 09/11/2017 1431   MCV 81.9 11/09/2019 0544   MCV 75 (L) 09/11/2017 1431   NEUTROABS 20.6 (H) 11/07/2019 1210   NEUTROABS 3.9 09/11/2017 1431   LYMPHSABS 7.6 (H) 11/07/2019 1210   LYMPHSABS 2.2 09/11/2017 1431   MONOABS 1.9 (H) 11/07/2019 1210  EOSABS 0.0 11/07/2019 1210   EOSABS 0.1 09/11/2017 1431   BASOSABS 0.1 11/07/2019 1210   BASOSABS 0.0 09/11/2017 1431    Recent Results (from the past 240 hour(s))  Blood Culture (routine x 2)     Status: None (Preliminary result)   Collection Time: 11/07/19 11:50 AM   Specimen: BLOOD  Result Value Ref Range Status   Specimen Description   Final    BLOOD RIGHT ANTECUBITAL Performed at [facility], [address], [place], [place] [address]    Special Requests   Final    BOTTLES DRAWN AEROBIC AND ANAEROBIC Blood Culture adequate volume Performed at [facility], [address] [place], [place] [address]    Culture   Final    NO GROWTH 2 DAYS Performed at [facility], [address], [place], [place] [address]    Report Status PENDING  Incomplete  Blood Culture (routine x 2)     Status: None (Preliminary result)   Collection Time: 11/07/19  4:55 PM   Specimen: BLOOD RIGHT HAND  Result Value Ref Range Status   Specimen Description   Final    BLOOD RIGHT HAND Performed at [facility], [address], [place], [place] [address]    Special Requests   Final    BOTTLES DRAWN AEROBIC ONLY Blood Culture adequate volume Performed at [facility], [address] [place], [place] [address]    Culture   Final    NO GROWTH 2 DAYS Performed at [facility], [address] [place], [place] [address]    Report Status PENDING  Incomplete  Culture, blood (single)     Status: None (Preliminary result)   Collection Time: 11/07/19  4:55 PM   Specimen: BLOOD LEFT HAND  Result Value Ref Range Status   Specimen Description   Final    BLOOD LEFT HAND Performed at [facility], [address], [place], [place] [address]    Special Requests   Final    BOTTLES DRAWN AEROBIC ONLY Blood Culture adequate volume Performed at [facility], [address] [place], [place] [address]    Culture   Final    NO GROWTH 2 DAYS Performed at [facility], [address] [place], [place] [address]    Report Status PENDING  Incomplete    Studies/Results: DG Chest Port 1 View  Result Date: 11/07/2019 CLINICAL DATA:  Shortness of breath. EXAM: PORTABLE CHEST 1 VIEW COMPARISON:  April 18, 2019 FINDINGS: New airspace opacities in the lateral right lung base. Low lung volumes. No visible pleural effusions or pneumothorax. Mild enlargement of the cardiac silhouette. No acute osseous abnormality. IMPRESSION: 1. New airspace opacities in the lateral right lung  base, compatible with pneumonia. 2. Mild cardiomegaly. Electronically Signed   By: Feliberto Harts MD   On: 11/07/2019 13:10   [place] EKG SITE RITE  Result Date: 11/09/2019 If Site Rite image not attached, placement could not be confirmed due to current cardiac rhythm.   Medications: Scheduled Meds: . sodium chloride   Intravenous Once  . albuterol  2 puff Inhalation TID  . diphenhydrAMINE  25 mg Intravenous Once  . DULoxetine  20 mg Oral Daily  . enoxaparin (LOVENOX) injection  40 mg Subcutaneous Q24H  . folic acid  1 mg Oral Daily  . HYDROmorphone   Intravenous Q4H  . hydroxyurea  2,000 mg Oral Daily  . metoprolol tartrate  12.5 mg Oral BID  . oxyCODONE  20 mg Oral Q12H  . senna-docusate  1 tablet Oral BID  . valACYclovir  500 mg Oral BID  . Vitamin D (Ergocalciferol)  50,000 Units Oral Q7 days  . voxelotor  1,500 mg Oral Daily   Continuous Infusions: . sodium chloride 100 mL/hr at 11/08/19 2215  . ceFEPime (MAXIPIME) IV 2 g (11/09/19 0539)  . diphenhydrAMINE    . vancomycin 1,000 mg (11/09/19 0402)   PRN Meds:.acetaminophen, diphenhydrAMINE **OR** diphenhydrAMINE, naloxone **AND** sodium chloride flush, ondansetron (ZOFRAN) IV, polyethylene glycol, tiZANidine  Consultants:  None  Procedures:  None  Antibiotics:  IV cefepime  IV vancomycin  Assessment/Plan: Principal Problem:   Sickle cell pain crisis (HCC) Active Problems:   Leukocytosis   Tachycardia with heart rate 100-120 beats per minute   Chronic pain syndrome   Anxiety   HCAP (healthcare-associated pneumonia)   Sepsis (HCC)   Acute kidney injury (HCC)   History of COVID-19   Hyperkalemia   Poor venous access  HCAP: Continue IV cefepime and vancomycin per pharmacy consult. MRSA swab and strep urinary antigen pending Tylenol 650 mg every 4 hours as needed for fever.  Supplemental oxygen at 2 L/min  Sickle cell disease with pain crisis:  Continue IV Dilaudid PCA, no change in settings on  today. Continue IV fluids at 0.45% saline at 100 mL/h. Hold Toradol due to acute kidney injury Monitor vital signs closely, reevaluate pain scale regularly, 2 L supplemental oxygen.  Leukocytosis: Markedly elevated.  Continue on IV antibiotics.  Blood cultures negative at day 2.  Continue to follow CBC.  Sickle cell anemia: Hemoglobin decreased to 7.5, which is below patient's baseline of 10-11 g/dL.  Patient has multiple alloantibodies, awaiting compatible unit.  Repeat CBC in a.m.  Chronic pain syndrome: Continue home medications  Sepsis: Heart rate 120s-140s, continue cardiac monitoring. Continue IV antibiotics without changes on today.  Close monitoring warranted.  Hypertension: Metoprolol 12.5 mg every 12 hours.  Monitor closely.  Transaminitis: Improving.  Secondary to vaso-occlusive crisis.  Continue to monitor closely.  Hepatic function panel in a.m.  Depression and anxiety: Stable.  Patient continues to deny any suicidal or homicidal intentions.  Code Status: Full Code Family Communication: N/A Disposition Plan: Not yet ready for discharge  Jachai Okazaki Rennis Petty  APRN, MSN, FNP-C [facility] [place] [facility] [address] [place], [place] [address] [phone]  If 5PM-8AM, please contact night-coverage.  11/09/2019, 12:49 PM  LOS: 2 days

## 2019-11-10 DIAGNOSIS — D5700 Hb-SS disease with crisis, unspecified: Secondary | ICD-10-CM | POA: Diagnosis not present

## 2019-11-10 LAB — BASIC METABOLIC PANEL
Anion gap: 11 (ref 5–15)
BUN: 16 mg/dL (ref 6–20)
CO2: 22 mmol/L (ref 22–32)
Calcium: 8.6 mg/dL — ABNORMAL LOW (ref 8.9–10.3)
Chloride: 111 mmol/L (ref 98–111)
Creatinine, Ser: 1.09 mg/dL — ABNORMAL HIGH (ref 0.44–1.00)
GFR calc non Af Amer: >60 mL/min (ref >60)
Glucose, Bld: 100 mg/dL — ABNORMAL HIGH (ref 70–99)
Potassium: 2.9 mmol/L — ABNORMAL LOW (ref 3.5–5.1)
Sodium: 144 mmol/L (ref 135–145)

## 2019-11-10 LAB — CBC
HCT: 19.9 % — ABNORMAL LOW (ref 36.0–46.0)
Hemoglobin: 6.5 g/dL — CL (ref 12.0–15.0)
MCH: 25.6 pg — ABNORMAL LOW (ref 26.0–34.0)
MCHC: 32.7 g/dL (ref 30.0–36.0)
MCV: 78.3 fL — ABNORMAL LOW (ref 80.0–100.0)
Platelets: 186 10*3/uL (ref 150–400)
RBC: 2.54 MIL/uL — ABNORMAL LOW (ref 3.87–5.11)
RDW: 22 % — ABNORMAL HIGH (ref 11.5–15.5)
WBC: 22 10*3/uL — ABNORMAL HIGH (ref 4.0–10.5)
nRBC: 13.2 % — ABNORMAL HIGH (ref 0.0–0.2)

## 2019-11-10 LAB — HEPATIC FUNCTION PANEL
ALT: 38 U/L (ref 0–44)
AST: 24 U/L (ref 15–41)
Albumin: 2.3 g/dL — ABNORMAL LOW (ref 3.5–5.0)
Alkaline Phosphatase: 68 U/L (ref 38–126)
Bilirubin, Direct: 0.5 mg/dL — ABNORMAL HIGH (ref 0.0–0.2)
Indirect Bilirubin: 1 mg/dL — ABNORMAL HIGH (ref 0.3–0.9)
Total Bilirubin: 1.5 mg/dL — ABNORMAL HIGH (ref 0.3–1.2)
Total Protein: 7.4 g/dL (ref 6.5–8.1)

## 2019-11-10 LAB — PREPARE RBC (CROSSMATCH)

## 2019-11-10 LAB — STREP PNEUMONIAE URINARY ANTIGEN: Strep Pneumo Urinary Antigen: NEGATIVE

## 2019-11-10 MED ORDER — ACETAMINOPHEN 325 MG PO TABS
650.0000 mg | ORAL_TABLET | Freq: Once | ORAL | Status: AC
  Administered 2019-11-10: 650 mg via ORAL
  Filled 2019-11-10: qty 2

## 2019-11-10 MED ORDER — DIPHENHYDRAMINE HCL 50 MG/ML IJ SOLN
25.0000 mg | Freq: Once | INTRAMUSCULAR | Status: AC
  Administered 2019-11-10: 25 mg via INTRAVENOUS
  Filled 2019-11-10: qty 1

## 2019-11-10 MED ORDER — POTASSIUM CHLORIDE 10 MEQ/100ML IV SOLN
10.0000 meq | INTRAVENOUS | Status: AC
  Administered 2019-11-10 (×2): 10 meq via INTRAVENOUS
  Filled 2019-11-10 (×2): qty 100

## 2019-11-10 NOTE — Progress Notes (Signed)
Pharmacy Antibiotic Note  Stephanie Burch is a 39 y.o. female admitted on 11/07/2019 with pneumonia.  She has hospitalized 9/11>>9/18 with sickle cell pain crisis.  Found to be COVID+ during this admission.  CXR + PNA today- no CXR done during previous admit as patient mostly asymptomatic from COVID standpoint.  Pharmacy has been consulted for Cefepime + Vancomycin dosing.  11/10/2019: -PCT not significantly elevated- 0.23 -WBC elevated, trending down  - MRSA negative so vancomycin has been stopped  -Scr slightly elevated above patient's baseline (<0.8) but NCrCl still~ 96 ml/min.   Plan: Continue Cefepime 2gm IV q8h Monitor renal function and cx data  Height: 5\' 5"  (165.1 cm) Weight: 127 kg (280 lb) IBW/kg (Calculated) : 57  Temp (24hrs), Avg:98.4 F (36.9 C), Min:98 F (36.7 C), Max:98.8 F (37.1 C)  Recent Labs  Lab 11/07/19 1210 11/07/19 1655 11/08/19 0452 11/09/19 0544 11/09/19 1530 11/10/19 0915  WBC 30.5*  --  27.1* 32.0*  --  22.0*  CREATININE 1.08*  --  1.09*  --  1.25* 1.09*  LATICACIDVEN 2.5* 1.9  --   --   --   --     Estimated Creatinine Clearance: 95.7 mL/min (A) (by C-G formula based on SCr of 1.09 mg/dL (H)).    No Known Allergies  Antimicrobials this admission: 10/4 Vancomycin >> 10/7  10/4 Cefepime >>   Dose adjustments this admission:  Microbiology results: 10/4 BCx: ngtd 10/6 MRSA PCR: negative   Thank you for allowing pharmacy to be a part of this patient's care.  12/6, PharmD, BCPS 11/10/2019 12:05 PM

## 2019-11-10 NOTE — Progress Notes (Signed)
CRITICAL VALUE ALERT  Critical Value:  Hgb 6.5  Date & Time Notied:11/10/19 0930  Provider Notified:Dr. Hyman Hopes  Orders Received/Actions taken: awaiting orders

## 2019-11-10 NOTE — Progress Notes (Signed)
11/09/19 7p-7a PCA Totals  Total received= 9mg   18 attempts  15 delivered

## 2019-11-10 NOTE — Progress Notes (Signed)
Subjective: Stephanie Burch is a 39 year old female with a medical history significant for sickle cell disease type [place], chronic pain syndrome, opiate dependence and tolerance, history of anemia of chronic disease, and depression was admitted for H CAP in the setting of sickle cell pain crisis.  Today, patient's hemoglobin is decreased to 6.5, which is far below her baseline.  Blood was difficult to locate due to patient's history of alloantibodies.  Patient's baseline is 10-11 g/dL.  Patient continues to be afebrile.  Heart rate remains 120s-140s.   Patient continues to complain of significant pain primarily to low back and lower extremities.  Patient states that she is unable to find a comfortable position.  Pain intensity is 8/10.  Patient is not maximizing IV Dilaudid PCA.  She continues to endorse shortness of breath with exertion.  Oxygen saturation is 100% on 2 L supplemental oxygen.  Objective:  Vital signs in last 24 hours:  Vitals:   11/10/19 1513 11/10/19 1520 11/10/19 1529 11/10/19 1529  BP: 126/84  125/79 125/79  Pulse: (!) 103  (!) 102 (!) 102  Resp: (!) 22 (!) 22 (!) 24 (!) 24  Temp: 98 F (36.7 C)  98.2 F (36.8 C) 98.2 F (36.8 C)  TempSrc: Oral     SpO2: 100% 100%  100%  Weight:      Height:        Intake/Output from previous day:   Intake/Output Summary (Last 24 hours) at 11/10/2019 1743 Last data filed at 11/10/2019 1300 Gross per 24 hour  Intake 860 ml  Output 2250 ml  Net -1390 ml    Physical Exam: General: Alert, awake, oriented x3, in no acute distress.  HEENT: [place]/AT PEERL, EOMI Neck: Trachea midline,  no masses, no thyromegal,y no JVD, no carotid bruit OROPHARYNX:  Moist, No exudate/ erythema/lesions.  Heart: Regular rate and rhythm, without murmurs, rubs, gallops, PMI non-displaced, no heaves or thrills on palpation.  Lungs: Clear to auscultation, no wheezing or rhonchi noted. No increased vocal fremitus resonant to percussion  Abdomen: Soft, nontender,  nondistended, positive bowel sounds, no masses no hepatosplenomegaly noted..  Neuro: No focal neurological deficits noted cranial nerves II through XII grossly intact. DTRs 2+ bilaterally upper and lower extremities. Strength 5 out of 5 in bilateral upper and lower extremities. Musculoskeletal: No warm swelling or erythema around joints, no spinal tenderness noted. Psychiatric: Patient alert and oriented x3, good insight and cognition, good recent to remote recall. Lymph node survey: No cervical axillary or inguinal lymphadenopathy noted.  Lab Results:  Basic Metabolic Panel:    Component Value Date/Time   NA 144 11/10/2019 0915   NA 142 09/11/2017 1431   K 2.9 (L) 11/10/2019 0915   CL 111 11/10/2019 0915   CO2 22 11/10/2019 0915   BUN 16 11/10/2019 0915   BUN 8 09/11/2017 1431   CREATININE 1.09 (H) 11/10/2019 0915   GLUCOSE 100 (H) 11/10/2019 0915   CALCIUM 8.6 (L) 11/10/2019 0915   CBC:    Component Value Date/Time   WBC 22.0 (H) 11/10/2019 0915   HGB 6.5 (LL) 11/10/2019 0915   HGB 13.0 09/11/2017 1431   HCT 19.9 (L) 11/10/2019 0915   HCT 38.4 09/11/2017 1431   PLT 186 11/10/2019 0915   PLT 258 09/11/2017 1431   MCV 78.3 (L) 11/10/2019 0915   MCV 75 (L) 09/11/2017 1431   NEUTROABS 20.6 (H) 11/07/2019 1210   NEUTROABS 3.9 09/11/2017 1431   LYMPHSABS 7.6 (H) 11/07/2019 1210   LYMPHSABS  2.2 09/11/2017 1431   MONOABS 1.9 (H) 11/07/2019 1210   EOSABS 0.0 11/07/2019 1210   EOSABS 0.1 09/11/2017 1431   BASOSABS 0.1 11/07/2019 1210   BASOSABS 0.0 09/11/2017 1431    Recent Results (from the past 240 hour(s))  Blood Culture (routine x 2)     Status: None (Preliminary result)   Collection Time: 11/07/19 11:50 AM   Specimen: BLOOD  Result Value Ref Range Status   Specimen Description   Final    BLOOD RIGHT ANTECUBITAL Performed at [facility], [address] [place], [place] [address]    Special Requests   Final    BOTTLES DRAWN AEROBIC AND ANAEROBIC Blood Culture  adequate volume Performed at [facility], [address], [place], [place] [address]    Culture   Final    NO GROWTH 3 DAYS Performed at [facility], [address], [place], [place] [address]    Report Status PENDING  Incomplete  Blood Culture (routine x 2)     Status: None (Preliminary result)   Collection Time: 11/07/19  4:55 PM   Specimen: BLOOD RIGHT HAND  Result Value Ref Range Status   Specimen Description   Final    BLOOD RIGHT HAND Performed at [facility], [address], [place], [place] [address]    Special Requests   Final    BOTTLES DRAWN AEROBIC ONLY Blood Culture adequate volume Performed at [facility], [address] [place], [place] [address]    Culture   Final    NO GROWTH 3 DAYS Performed at [facility], [address] [place], [place] [address]    Report Status PENDING  Incomplete  Culture, blood (single)     Status: None (Preliminary result)   Collection Time: 11/07/19  4:55 PM   Specimen: BLOOD LEFT HAND  Result Value Ref Range Status   Specimen Description   Final    BLOOD LEFT HAND Performed at [facility], [address] [place], [place] [address]    Special Requests   Final    BOTTLES DRAWN AEROBIC ONLY Blood Culture adequate volume Performed at [facility], [address] [place], [place] [address]    Culture   Final    NO GROWTH 3 DAYS Performed at [facility], [address] [place], [place] [address]    Report Status PENDING  Incomplete  MRSA PCR Screening     Status: None   Collection Time: 11/09/19  4:09 PM   Specimen: Nasal Mucosa; Nasopharyngeal  Result Value Ref Range Status   MRSA by PCR NEGATIVE NEGATIVE Final    Comment:        The GeneXpert MRSA Assay (FDA approved for NASAL specimens only), is one component of a comprehensive MRSA colonization surveillance program. It is not intended to diagnose  MRSA infection nor to guide or monitor treatment for MRSA infections. Performed at [facility], [address] [place], [place] [address]     Studies/Results: [place] EKG SITE RITE  Result Date: 11/09/2019 If Site Rite image not attached, placement could not be confirmed due to current cardiac rhythm.   Medications: Scheduled Meds:  sodium chloride   Intravenous Once   albuterol  2 puff Inhalation TID   Chlorhexidine Gluconate Cloth  6 each Topical Daily   DULoxetine  20 mg Oral Daily   enoxaparin (LOVENOX) injection  40 mg Subcutaneous Q24H  folic acid  1 mg Oral Daily   HYDROmorphone   Intravenous Q4H   hydroxyurea  2,000 mg Oral Daily   metoprolol tartrate  12.5 mg Oral BID   oxyCODONE  20 mg Oral Q12H   senna-docusate  1 tablet Oral BID   valACYclovir  500 mg Oral BID   Vitamin D (Ergocalciferol)  50,000 Units Oral Q7 days   voxelotor  1,500 mg Oral Daily   Continuous Infusions:  ceFEPime (MAXIPIME) IV 2 g (11/10/19 1606)   diphenhydrAMINE     PRN Meds:.acetaminophen, diphenhydrAMINE **OR** diphenhydrAMINE, naloxone **AND** sodium chloride flush, ondansetron (ZOFRAN) IV, polyethylene glycol, sodium chloride flush, tiZANidine  Consultants:  None  Procedures:  None  Antibiotics:  None  Assessment/Plan: Principal Problem:   Sickle cell pain crisis (HCC) Active Problems:   Leukocytosis   Tachycardia with heart rate 100-120 beats per minute   Chronic pain syndrome   Anxiety   HCAP (healthcare-associated pneumonia)   Sepsis (HCC)   Acute kidney injury (HCC)   History of COVID-19   Hyperkalemia   Poor venous access   Essential hypertension   HCAP vs. Acute chest syndrome:  MRSA by PCR and strep pneumonia negative.  IV vancomycin discontinued. Continue IV cefepime Tylenol 6 or 50 mg every 4 hours as needed for fever. Continue supplemental oxygen at 2 L/min.  Titrate as tolerated.  Sickle cell disease with pain  crisis: Continue IV Dilaudid PCA, no changes in settings on today Continue IV fluids at [facility] Hold Toradol due to acute kidney injury Monitor vital signs closely, reevaluate pain scale regularly, supplemental oxygen as needed.  Leukocytosis: Improving.  Continue IV antibiotics.  Blood cultures continue to be negative.  Continue to follow CBC.  Repeat in a.m.  Sickle cell anemia: Hemoglobin decreased to 6.5.  Unit available in blood bank.  Will transfuse 1 unit PRBCs today.  Follow CBC.  Chronic pain syndrome: Continue home medications  Sepsis: Heart rate 120s-140s, continue cardiac monitoring. Continue IV antibiotics Close monitoring warranted.  Hypertension: Continue metoprolol 12.5 mg every 12 hours Continue to monitor closely.  Transaminitis: Improving.  Considered to be secondary to vaso-occlusive pain crisis.  Continue to monitor closely.  Depression and anxiety: Stable.  Patient denies any suicidal intentions  Hypokalemia: Replete potassium.  Follow-up potassium and magnesium levels in a.m.   Code Status: Full Code Family Communication: N/A Disposition Plan: Not yet ready for discharge  Colleen Donahoe Rennis Petty  APRN, MSN, FNP-C [facility] [address] [place], [place] [address] [phone]  If 7PM-7AM, please contact night-coverage.  11/10/2019, 5:43 PM  LOS: 3 days

## 2019-11-11 ENCOUNTER — Inpatient Hospital Stay (HOSPITAL_COMMUNITY): Payer: Medicaid Other

## 2019-11-11 DIAGNOSIS — J811 Chronic pulmonary edema: Secondary | ICD-10-CM

## 2019-11-11 DIAGNOSIS — I10 Essential (primary) hypertension: Secondary | ICD-10-CM | POA: Diagnosis not present

## 2019-11-11 DIAGNOSIS — I509 Heart failure, unspecified: Secondary | ICD-10-CM | POA: Diagnosis not present

## 2019-11-11 DIAGNOSIS — I517 Cardiomegaly: Secondary | ICD-10-CM | POA: Diagnosis not present

## 2019-11-11 DIAGNOSIS — G894 Chronic pain syndrome: Secondary | ICD-10-CM

## 2019-11-11 DIAGNOSIS — R079 Chest pain, unspecified: Secondary | ICD-10-CM | POA: Diagnosis not present

## 2019-11-11 DIAGNOSIS — R0602 Shortness of breath: Secondary | ICD-10-CM | POA: Diagnosis not present

## 2019-11-11 DIAGNOSIS — R0902 Hypoxemia: Secondary | ICD-10-CM

## 2019-11-11 DIAGNOSIS — D5700 Hb-SS disease with crisis, unspecified: Secondary | ICD-10-CM | POA: Diagnosis not present

## 2019-11-11 DIAGNOSIS — J189 Pneumonia, unspecified organism: Secondary | ICD-10-CM | POA: Diagnosis not present

## 2019-11-11 DIAGNOSIS — A419 Sepsis, unspecified organism: Secondary | ICD-10-CM | POA: Diagnosis not present

## 2019-11-11 DIAGNOSIS — J90 Pleural effusion, not elsewhere classified: Secondary | ICD-10-CM | POA: Diagnosis not present

## 2019-11-11 HISTORY — DX: Chronic pulmonary edema: J81.1

## 2019-11-11 LAB — PREPARE RBC (CROSSMATCH)

## 2019-11-11 LAB — BASIC METABOLIC PANEL
Anion gap: 13 (ref 5–15)
BUN: 17 mg/dL (ref 6–20)
CO2: 19 mmol/L — ABNORMAL LOW (ref 22–32)
Calcium: 8.3 mg/dL — ABNORMAL LOW (ref 8.9–10.3)
Chloride: 106 mmol/L (ref 98–111)
Creatinine, Ser: 1.16 mg/dL — ABNORMAL HIGH (ref 0.44–1.00)
GFR calc non Af Amer: >60 mL/min (ref >60)
Glucose, Bld: 188 mg/dL — ABNORMAL HIGH (ref 70–99)
Potassium: 2.6 mmol/L — CL (ref 3.5–5.1)
Sodium: 138 mmol/L (ref 135–145)

## 2019-11-11 LAB — CBC
HCT: 21.2 % — ABNORMAL LOW (ref 36.0–46.0)
Hemoglobin: 7.1 g/dL — ABNORMAL LOW (ref 12.0–15.0)
MCH: 26.5 pg (ref 26.0–34.0)
MCHC: 33.5 g/dL (ref 30.0–36.0)
MCV: 79.1 fL — ABNORMAL LOW (ref 80.0–100.0)
Platelets: 206 10*3/uL (ref 150–400)
RBC: 2.68 MIL/uL — ABNORMAL LOW (ref 3.87–5.11)
RDW: 21.7 % — ABNORMAL HIGH (ref 11.5–15.5)
WBC: 20.4 10*3/uL — ABNORMAL HIGH (ref 4.0–10.5)
nRBC: 8.5 % — ABNORMAL HIGH (ref 0.0–0.2)

## 2019-11-11 LAB — HEMOGLOBIN AND HEMATOCRIT, BLOOD
HCT: 25.7 % — ABNORMAL LOW (ref 36.0–46.0)
Hemoglobin: 8.4 g/dL — ABNORMAL LOW (ref 12.0–15.0)

## 2019-11-11 LAB — MAGNESIUM: Magnesium: 1.9 mg/dL (ref 1.7–2.4)

## 2019-11-11 MED ORDER — IPRATROPIUM-ALBUTEROL 0.5-2.5 (3) MG/3ML IN SOLN
3.0000 mL | RESPIRATORY_TRACT | Status: DC
  Administered 2019-11-11: 3 mL via RESPIRATORY_TRACT
  Filled 2019-11-11: qty 3

## 2019-11-11 MED ORDER — FUROSEMIDE 10 MG/ML IJ SOLN
40.0000 mg | Freq: Once | INTRAMUSCULAR | Status: AC
  Administered 2019-11-11: 40 mg via INTRAVENOUS
  Filled 2019-11-11: qty 4

## 2019-11-11 MED ORDER — POTASSIUM CHLORIDE CRYS ER 20 MEQ PO TBCR
40.0000 meq | EXTENDED_RELEASE_TABLET | Freq: Two times a day (BID) | ORAL | Status: AC
  Administered 2019-11-11 (×2): 40 meq via ORAL
  Filled 2019-11-11: qty 2

## 2019-11-11 MED ORDER — POTASSIUM CHLORIDE CRYS ER 20 MEQ PO TBCR
20.0000 meq | EXTENDED_RELEASE_TABLET | Freq: Every day | ORAL | Status: DC
  Administered 2019-11-12 – 2019-11-13 (×2): 20 meq via ORAL
  Filled 2019-11-11 (×2): qty 1

## 2019-11-11 MED ORDER — ALBUTEROL SULFATE HFA 108 (90 BASE) MCG/ACT IN AERS: 2.0000 | INHALATION_SPRAY | RESPIRATORY_TRACT | Status: DC | PRN

## 2019-11-11 MED ORDER — ALBUTEROL SULFATE HFA 108 (90 BASE) MCG/ACT IN AERS
2.0000 | INHALATION_SPRAY | Freq: Two times a day (BID) | RESPIRATORY_TRACT | Status: DC
  Administered 2019-11-11: 2 via RESPIRATORY_TRACT

## 2019-11-11 MED ORDER — ACETAMINOPHEN 325 MG PO TABS
650.0000 mg | ORAL_TABLET | Freq: Once | ORAL | Status: AC
  Administered 2019-11-11: 650 mg via ORAL
  Filled 2019-11-11: qty 2

## 2019-11-11 MED ORDER — IPRATROPIUM-ALBUTEROL 0.5-2.5 (3) MG/3ML IN SOLN
3.0000 mL | Freq: Three times a day (TID) | RESPIRATORY_TRACT | Status: DC
  Administered 2019-11-12: 3 mL via RESPIRATORY_TRACT
  Filled 2019-11-11: qty 3

## 2019-11-11 MED ORDER — DIPHENHYDRAMINE HCL 50 MG/ML IJ SOLN
25.0000 mg | Freq: Once | INTRAMUSCULAR | Status: AC
  Administered 2019-11-11: 25 mg via INTRAVENOUS
  Filled 2019-11-11: qty 1

## 2019-11-11 NOTE — Progress Notes (Signed)
Subjective: Stephanie Burch is a 39 year old female with a medical history significant for sickle cell disease type [place], chronic pain syndrome, opiate dependence and tolerance, history of anemia of chronic disease, and depression was admitted for pneumonia in the setting of sickle cell pain crisis.  Today, patient's hemoglobin is 7.1, which is slightly improved from previous.  Baseline hemoglobin is 10-11 g/dL.  Patient is s/p 1 unit PRBCs.  Patient afebrile overnight. Oxygen needs increased, oxygen saturation 100% on 4 L/min.  Patient complaining of shortness of breath.  She states that shortness of breath is worsened with talking and movement.  She feels that she is unable to take deep breaths.  Patient continues to complain of significant pain primarily to low back and lower extremities.  Pain intensity is 8/10.  Patient denies headache, dizziness, urinary symptoms, nausea, vomiting, or diarrhea.  Objective:  Vital signs in last 24 hours:  Vitals:   11/11/19 0810 11/11/19 0937 11/11/19 1025 11/11/19 1055  BP:  123/85 134/81 128/84  Pulse:  (!) 105 (!) 104 100  Resp: (!) 30 (!) 28 (!) 30 (!) 25  Temp:  97.9 F (36.6 C) 99 F (37.2 C) 98.4 F (36.9 C)  TempSrc:  Oral Oral Oral  SpO2: 100% 100% 100% 100%  Weight:      Height:        Intake/Output from previous day:   Intake/Output Summary (Last 24 hours) at 11/11/2019 1223 Last data filed at 11/11/2019 0530 Gross per 24 hour  Intake 1462 ml  Output --  Net 1462 ml    Physical Exam: General: Alert, awake, oriented x3, in no acute distress.  HEENT: [place]/AT PEERL, EOMI Neck: Trachea midline,  no masses, no thyromegal,y no JVD, no carotid bruit OROPHARYNX:  Moist, No exudate/ erythema/lesions.  Heart: Regular rate and rhythm, without murmurs, rubs, gallops, PMI non-displaced, no heaves or thrills on palpation.  Lungs: Clear to auscultation, no wheezing or rhonchi noted. No increased vocal fremitus resonant to percussion  Abdomen:  Soft, nontender, nondistended, positive bowel sounds, no masses no hepatosplenomegaly noted..  Neuro: No focal neurological deficits noted cranial nerves II through XII grossly intact. DTRs 2+ bilaterally upper and lower extremities. Strength 5 out of 5 in bilateral upper and lower extremities. Musculoskeletal: No warm swelling or erythema around joints, no spinal tenderness noted. Psychiatric: Patient alert and oriented x3, good insight and cognition, good recent to remote recall. Lymph node survey: No cervical axillary or inguinal lymphadenopathy noted.  Lab Results:  Basic Metabolic Panel:    Component Value Date/Time   NA 138 11/11/2019 0345   NA 142 09/11/2017 1431   K 2.6 (LL) 11/11/2019 0345   CL 106 11/11/2019 0345   CO2 19 (L) 11/11/2019 0345   BUN 17 11/11/2019 0345   BUN 8 09/11/2017 1431   CREATININE 1.16 (H) 11/11/2019 0345   GLUCOSE 188 (H) 11/11/2019 0345   CALCIUM 8.3 (L) 11/11/2019 0345   CBC:    Component Value Date/Time   WBC 20.4 (H) 11/11/2019 0742   HGB 7.1 (L) 11/11/2019 0742   HGB 13.0 09/11/2017 1431   HCT 21.2 (L) 11/11/2019 0742   HCT 38.4 09/11/2017 1431   PLT 206 11/11/2019 0742   PLT 258 09/11/2017 1431   MCV 79.1 (L) 11/11/2019 0742   MCV 75 (L) 09/11/2017 1431   NEUTROABS 20.6 (H) 11/07/2019 1210   NEUTROABS 3.9 09/11/2017 1431   LYMPHSABS 7.6 (H) 11/07/2019 1210   LYMPHSABS 2.2 09/11/2017 1431   MONOABS 1.9 (H)  11/07/2019 1210   EOSABS 0.0 11/07/2019 1210   EOSABS 0.1 09/11/2017 1431   BASOSABS 0.1 11/07/2019 1210   BASOSABS 0.0 09/11/2017 1431    Recent Results (from the past 240 hour(s))  Blood Culture (routine x 2)     Status: None (Preliminary result)   Collection Time: 11/07/19 11:50 AM   Specimen: BLOOD  Result Value Ref Range Status   Specimen Description   Final    BLOOD RIGHT ANTECUBITAL Performed at [facility], [address], [place], [place] [address]    Special Requests   Final    BOTTLES DRAWN AEROBIC AND  ANAEROBIC Blood Culture adequate volume Performed at [facility], [address] [place], [place] [address]    Culture   Final    NO GROWTH 4 DAYS Performed at [facility], [address] [place], [place] [address]    Report Status PENDING  Incomplete  Blood Culture (routine x 2)     Status: None (Preliminary result)   Collection Time: 11/07/19  4:55 PM   Specimen: BLOOD RIGHT HAND  Result Value Ref Range Status   Specimen Description   Final    BLOOD RIGHT HAND Performed at [facility], [address], [place], [place] [address]    Special Requests   Final    BOTTLES DRAWN AEROBIC ONLY Blood Culture adequate volume Performed at [facility], [address], [place], [place] [address]    Culture   Final    NO GROWTH 4 DAYS Performed at [facility], [address], [place], [place] [address]    Report Status PENDING  Incomplete  Culture, blood (single)     Status: None (Preliminary result)   Collection Time: 11/07/19  4:55 PM   Specimen: BLOOD LEFT HAND  Result Value Ref Range Status   Specimen Description   Final    BLOOD LEFT HAND Performed at [facility], [address], [place], [place] [address]    Special Requests   Final    BOTTLES DRAWN AEROBIC ONLY Blood Culture adequate volume Performed at [facility], [address] [place], [place] [address]    Culture   Final    NO GROWTH 4 DAYS Performed at [facility], [address] [place], [place] [address]    Report Status PENDING  Incomplete  MRSA PCR Screening     Status: None   Collection Time: 11/09/19  4:09 PM   Specimen: Nasal Mucosa; Nasopharyngeal  Result Value Ref Range Status   MRSA by PCR NEGATIVE NEGATIVE Final    Comment:        The GeneXpert MRSA Assay (FDA approved for NASAL specimens only), is one component of a comprehensive MRSA colonization surveillance program. It is  not intended to diagnose MRSA infection nor to guide or monitor treatment for MRSA infections. Performed at [facility], [address], [place], [place] [address]     Studies/Results: DG Chest 1 View  Result Date: 11/11/2019 CLINICAL DATA:  Shortness of breath and chest pain. History of sickle cell disease EXAM: CHEST  1 VIEW COMPARISON:  November 07, 2019 FINDINGS: Central catheter tip is in the superior vena cava. No pneumothorax. There are pleural effusions bilaterally with patchy airspace opacity in the lower lung regions bilaterally. There is cardiomegaly with mild pulmonary venous hypertension. No adenopathy is demonstrable. No appreciable bone lesions. IMPRESSION: Central catheter tip in superior  vena cava. No pneumothorax. Cardiomegaly with a degree of pulmonary vascular congestion. Pleural effusions bilaterally. Question a degree of underlying congestive heart failure. There is bibasilar airspace opacity which likely is due primarily to atelectasis, although a degree of pulmonary edema and/or pneumonia in the lung bases cannot be excluded. There is slightly more consolidation in the left base than right base; the appearance of consolidation raises concern for pneumonia in this area on the left. Electronically Signed   By: Bretta Bang III M.D.   On: 11/11/2019 08:46    Medications: Scheduled Meds:  sodium chloride   Intravenous Once   albuterol  2 puff Inhalation BID   Chlorhexidine Gluconate Cloth  6 each Topical Daily   DULoxetine  20 mg Oral Daily   enoxaparin (LOVENOX) injection  40 mg Subcutaneous Q24H   folic acid  1 mg Oral Daily   furosemide  40 mg Intravenous Once   HYDROmorphone   Intravenous Q4H   hydroxyurea  2,000 mg Oral Daily   metoprolol tartrate  12.5 mg Oral BID   oxyCODONE  20 mg Oral Q12H   potassium chloride  40 mEq Oral BID   senna-docusate  1 tablet Oral BID   valACYclovir  500 mg Oral BID   Vitamin D  (Ergocalciferol)  50,000 Units Oral Q7 days   voxelotor  1,500 mg Oral Daily   Continuous Infusions:  ceFEPime (MAXIPIME) IV 2 g (11/11/19 0617)   diphenhydrAMINE     PRN Meds:.acetaminophen, diphenhydrAMINE **OR** diphenhydrAMINE, naloxone **AND** sodium chloride flush, ondansetron (ZOFRAN) IV, polyethylene glycol, sodium chloride flush, tiZANidine  Consultants:  None  Procedures:  None  Antibiotics:  None  Assessment/Plan: Principal Problem:   Sickle cell pain crisis (HCC) Active Problems:   Leukocytosis   Tachycardia with heart rate 100-120 beats per minute   Chronic pain syndrome   Hypokalemia   Anxiety   HCAP (healthcare-associated pneumonia)   Sepsis (HCC)   Acute kidney injury (HCC)   History of COVID-19   Poor venous access   Essential hypertension  HCAP vs. Acute chest syndrome:  Oxygen needs of increased, patient requiring 4 L supplemental oxygen on 2 L.  Repeat chest x-ray shows cardiomegaly with the degree of pulmonary vascular congestion and pleural effusions bilaterally.  Questionable degree of heart failure.  Bibasilar airspace opacity.  There is more consolidation in left base than right.  Furosemide 40 mg IV x1 dose Continue IV antibiotics. Hemoglobin has improved to 7.1, which continues to be far below patient's baseline.  Transfuse additional unit of PRBCs today. Duo nebulizer every 6 hours Tylenol 650 mg every 4 hours as needed for fever Continue supplemental oxygen.  Titrate oxygen as tolerated.  Sickle cell disease with pain crisis: Continue IV Dilaudid PCA, no changes in settings on today. Continue to hold Toradol due to acute kidney injury.  Monitor vital signs closely and reevaluate pain scale regularly  Leukocytosis: Improving.  Continue IV antibiotics.  Blood cultures continue to be negative.  Follow CBC.  Repeat in a.m.  Sickle cell anemia: Hemoglobin has improved to 7.1, which continues to be largely below patient's baseline.   Transfuse 1 unit PRBCs.  Continue to follow CBC.  Chronic pain syndrome: Continue home medications  Sepsis: Tachycardia improving, continue cardiac monitoring.  Continue IV antibiotics.  Close monitoring warranted.  Hypertension: Stable.  Continue metoprolol 12.5 every 12 hours.  Continue to monitor closely.  Transaminitis: Improving.  Considered to be secondary to vaso-occlusive pain crisis.  Continue to follow closely.  Depression anxiety: Stable.  Continue home medications.  Hypokalemia: Continue to replete potassium.  Magnesium within normal range.  Repeat potassium level in a.m.    Code Status: Full Code Family Communication: N/A Disposition Plan: Not yet ready for discharge  Michelyn Scullin Rennis PettyMoore Emilina Smarr  APRN, MSN, FNP-C [facility] [address]  [place], [place] [address] [phone]  If 5PM-7AM, please contact night-coverage.  11/11/2019, 12:23 PM  LOS: 4 days

## 2019-11-11 NOTE — Progress Notes (Signed)
CRITICAL VALUE ALERT  Critical Value: 2.6  Date & Time Notied:  11/11/19 @ 0515  Provider Notified: Bruna Potter  Orders Received/Actions taken: awaiting

## 2019-11-12 DIAGNOSIS — D5700 Hb-SS disease with crisis, unspecified: Secondary | ICD-10-CM | POA: Diagnosis not present

## 2019-11-12 LAB — CBC WITH DIFFERENTIAL/PLATELET
Abs Immature Granulocytes: 0.14 10*3/uL — ABNORMAL HIGH (ref 0.00–0.07)
Basophils Absolute: 0 10*3/uL (ref 0.0–0.1)
Basophils Relative: 0 %
Eosinophils Absolute: 0.3 10*3/uL (ref 0.0–0.5)
Eosinophils Relative: 2 %
HCT: 24.5 % — ABNORMAL LOW (ref 36.0–46.0)
Hemoglobin: 8 g/dL — ABNORMAL LOW (ref 12.0–15.0)
Immature Granulocytes: 1 %
Lymphocytes Relative: 5 %
Lymphs Abs: 0.8 10*3/uL (ref 0.7–4.0)
MCH: 26.3 pg (ref 26.0–34.0)
MCHC: 32.7 g/dL (ref 30.0–36.0)
MCV: 80.6 fL (ref 80.0–100.0)
Monocytes Absolute: 0.9 10*3/uL (ref 0.1–1.0)
Monocytes Relative: 6 %
Neutro Abs: 12.6 10*3/uL — ABNORMAL HIGH (ref 1.7–7.7)
Neutrophils Relative %: 86 %
Platelets: 197 10*3/uL (ref 150–400)
RBC: 3.04 MIL/uL — ABNORMAL LOW (ref 3.87–5.11)
RDW: 20.9 % — ABNORMAL HIGH (ref 11.5–15.5)
WBC: 14.7 10*3/uL — ABNORMAL HIGH (ref 4.0–10.5)
nRBC: 8.9 % — ABNORMAL HIGH (ref 0.0–0.2)

## 2019-11-12 LAB — COMPREHENSIVE METABOLIC PANEL
ALT: 27 U/L (ref 0–44)
AST: 23 U/L (ref 15–41)
Albumin: 2.3 g/dL — ABNORMAL LOW (ref 3.5–5.0)
Alkaline Phosphatase: 65 U/L (ref 38–126)
Anion gap: 9 (ref 5–15)
BUN: 18 mg/dL (ref 6–20)
CO2: 22 mmol/L (ref 22–32)
Calcium: 8.3 mg/dL — ABNORMAL LOW (ref 8.9–10.3)
Chloride: 107 mmol/L (ref 98–111)
Creatinine, Ser: 1.11 mg/dL — ABNORMAL HIGH (ref 0.44–1.00)
GFR, Estimated: >60 mL/min (ref >60)
Glucose, Bld: 160 mg/dL — ABNORMAL HIGH (ref 70–99)
Potassium: 2.5 mmol/L — CL (ref 3.5–5.1)
Sodium: 138 mmol/L (ref 135–145)
Total Bilirubin: 1.1 mg/dL (ref 0.3–1.2)
Total Protein: 7 g/dL (ref 6.5–8.1)

## 2019-11-12 LAB — CULTURE, BLOOD (ROUTINE X 2)
Culture: NO GROWTH
Culture: NO GROWTH
Special Requests: ADEQUATE
Special Requests: ADEQUATE

## 2019-11-12 LAB — CULTURE, BLOOD (SINGLE)
Culture: NO GROWTH
Special Requests: ADEQUATE

## 2019-11-12 LAB — TYPE AND SCREEN
ABO/RH(D): B POS
Antibody Screen: POSITIVE
Unit division: 0
Unit division: 0

## 2019-11-12 LAB — BPAM RBC
Blood Product Expiration Date: 202110162359
Blood Product Expiration Date: 202111022359
ISSUE DATE / TIME: 202110071505
ISSUE DATE / TIME: 202110081031
Unit Type and Rh: 5100
Unit Type and Rh: 9500

## 2019-11-12 MED ORDER — POTASSIUM CHLORIDE 10 MEQ/100ML IV SOLN
10.0000 meq | INTRAVENOUS | Status: AC
  Administered 2019-11-12 (×2): 10 meq via INTRAVENOUS
  Filled 2019-11-12 (×2): qty 100

## 2019-11-12 MED ORDER — POTASSIUM CHLORIDE CRYS ER 20 MEQ PO TBCR
40.0000 meq | EXTENDED_RELEASE_TABLET | Freq: Once | ORAL | Status: AC
  Administered 2019-11-12: 40 meq via ORAL
  Filled 2019-11-12: qty 2

## 2019-11-12 MED ORDER — IPRATROPIUM-ALBUTEROL 0.5-2.5 (3) MG/3ML IN SOLN
3.0000 mL | Freq: Two times a day (BID) | RESPIRATORY_TRACT | Status: DC
  Administered 2019-11-12 – 2019-11-13 (×3): 3 mL via RESPIRATORY_TRACT
  Filled 2019-11-12 (×3): qty 3

## 2019-11-12 MED ORDER — POTASSIUM CHLORIDE 10 MEQ/100ML IV SOLN
10.0000 meq | INTRAVENOUS | Status: AC
  Administered 2019-11-12: 10 meq via INTRAVENOUS
  Filled 2019-11-12: qty 100

## 2019-11-12 NOTE — Progress Notes (Signed)
Subjective: Patient admitted with acute sickle cell crisis but also healthcare associated pneumonia with acute chest syndrome.  She has been requiring up to 4 L of oxygen.  She is complaining of more pain today.  She has been tachypneic alert.  Oxygen sat however 100% at this point.  No nausea vomiting or diarrhea.  Hemoglobin is above 8 after transfusion of 1 unit packed red blood cells.  She was septic but improving.  Objective: Vital signs in last 24 hours: Temp:  [97.9 F (36.6 C)-99 F (37.2 C)] 98.2 F (36.8 C) (10/09 0822) Pulse Rate:  [100-119] 111 (10/09 0822) Resp:  [15-30] 17 (10/09 0822) BP: (122-134)/(76-89) 122/83 (10/09 0822) SpO2:  [96 %-100 %] 100 % (10/09 0822) Weight change:  Last BM Date:  (prior to admission )  Intake/Output from previous day: 10/08 0701 - 10/09 0700 In: 698.6 [Blood:436; IV Piggyback:262.6] Out: -  Intake/Output this shift: No intake/output data recorded.  General appearance: alert, cooperative, appears older than stated age and mild distress Head: Normocephalic, without obvious abnormality, atraumatic Back: symmetric, no curvature. ROM normal. No CVA tenderness. Resp: clear to auscultation bilaterally Cardio: regular rate and rhythm, S1, S2 normal, no murmur, click, rub or gallop GI: soft, non-tender; bowel sounds normal; no masses,  no organomegaly Extremities: extremities normal, atraumatic, no cyanosis or edema Pulses: 2+ and symmetric Skin: Skin color, texture, turgor normal. No rashes or lesions Neurologic: Grossly normal  Lab Results: Recent Labs    11/11/19 0742 11/11/19 0742 11/11/19 1648 11/12/19 0415  WBC 20.4*  --   --  14.7*  HGB 7.1*   < > 8.4* 8.0*  HCT 21.2*   < > 25.7* 24.5*  PLT 206  --   --  197   < > = values in this interval not displayed.   BMET Recent Labs    11/11/19 0345 11/12/19 0415  NA 138 138  K 2.6* 2.5*  CL 106 107  CO2 19* 22  GLUCOSE 188* 160*  BUN 17 18  CREATININE 1.16* 1.11*  CALCIUM  8.3* 8.3*    Studies/Results: DG Chest 1 View  Result Date: 11/11/2019 CLINICAL DATA:  Shortness of breath and chest pain. History of sickle cell disease EXAM: CHEST  1 VIEW COMPARISON:  November 07, 2019 FINDINGS: Central catheter tip is in the superior vena cava. No pneumothorax. There are pleural effusions bilaterally with patchy airspace opacity in the lower lung regions bilaterally. There is cardiomegaly with mild pulmonary venous hypertension. No adenopathy is demonstrable. No appreciable bone lesions. IMPRESSION: Central catheter tip in superior vena cava. No pneumothorax. Cardiomegaly with a degree of pulmonary vascular congestion. Pleural effusions bilaterally. Question a degree of underlying congestive heart failure. There is bibasilar airspace opacity which likely is due primarily to atelectasis, although a degree of pulmonary edema and/or pneumonia in the lung bases cannot be excluded. There is slightly more consolidation in the left base than right base; the appearance of consolidation raises concern for pneumonia in this area on the left. Electronically Signed   By: Bretta Bang III M.D.   On: 11/11/2019 08:46    Medications: I have reviewed the patient's current medications.  Assessment/Plan: A 39 year old female with history of sickle cell disease admitted with sickle cell painful crisis.  #1 sickle cell anemia with crisis: Patient continues to have significant pain on her Dilaudid PCA with Toradol.  I will continue with her home regimen.  Continue his current regimen also of Dilaudid PCA and hold Toradol due to  AKI.  Counseling provided.  As needed medication.  #2 acute chest syndrome: Continue with IV antibiotics.  Monitoring closely.  She has healthcare associated pneumonia most likely.  #3 chronic pain syndrome: Again continue with long-acting medications.  #4 acute kidney injury: Appears to be improving.  Hydration.  Avoid nonsteroidals at this point.  #5 hypertension:  Appears controlled.  #6 leukocytosis: Most likely due to pneumonia and acute chest syndrome  #7 transaminitis: Continue to hydrate and monitor.  LOS: 5 days   Sora Vrooman,LAWAL 11/12/2019, 8:31 AM

## 2019-11-12 NOTE — Progress Notes (Signed)
Lab Research officer, political party with critical lab Potassium 2.5  Provider was made aware.

## 2019-11-13 DIAGNOSIS — D5700 Hb-SS disease with crisis, unspecified: Secondary | ICD-10-CM | POA: Diagnosis not present

## 2019-11-13 LAB — BASIC METABOLIC PANEL
Anion gap: 6 (ref 5–15)
BUN: 15 mg/dL (ref 6–20)
CO2: 24 mmol/L (ref 22–32)
Calcium: 8.3 mg/dL — ABNORMAL LOW (ref 8.9–10.3)
Chloride: 108 mmol/L (ref 98–111)
Creatinine, Ser: 0.96 mg/dL (ref 0.44–1.00)
GFR, Estimated: >60 mL/min (ref >60)
Glucose, Bld: 109 mg/dL — ABNORMAL HIGH (ref 70–99)
Potassium: 3.2 mmol/L — ABNORMAL LOW (ref 3.5–5.1)
Sodium: 138 mmol/L (ref 135–145)

## 2019-11-13 MED ORDER — POTASSIUM CHLORIDE CRYS ER 20 MEQ PO TBCR
40.0000 meq | EXTENDED_RELEASE_TABLET | Freq: Two times a day (BID) | ORAL | Status: AC
  Administered 2019-11-14 (×2): 40 meq via ORAL
  Filled 2019-11-13 (×2): qty 2

## 2019-11-13 NOTE — Plan of Care (Signed)

## 2019-11-13 NOTE — Progress Notes (Signed)
Subjective: Patient is complaining of more pain today.  She has been tachypneic and has been afebrile.  For the most part vitals and labs are slowly improved but she is hypokalemic.  Also appears to be still have a leukocytosis.  She is still laying in bed has not been out of bed at all..  Oxygen sat however 100% at this point.  No nausea vomiting or diarrhea.  Hemoglobin is above 8 after transfusion of 1 unit packed red blood cells.  She was septic but improving.  Objective: Vital signs in last 24 hours: Temp:  [97.9 F (36.6 C)-98.7 F (37.1 C)] 98.4 F (36.9 C) (10/10 2059) Pulse Rate:  [97-116] 109 (10/10 2059) Resp:  [16-28] 20 (10/10 2059) BP: (94-114)/(58-88) 108/63 (10/10 2059) SpO2:  [96 %-100 %] 100 % (10/10 2059) FiO2 (%):  [3 %] 3 % (10/10 1236) Weight change:  Last BM Date:  (prior to admission )  Intake/Output from previous day: 10/09 0701 - 10/10 0700 In: 100 [IV Piggyback:100] Out: 2500 [Urine:2500] Intake/Output this shift: Total I/O In: 240 [P.O.:240] Out: 1200 [Urine:1200]  General appearance: alert, cooperative, appears older than stated age and mild distress Head: Normocephalic, without obvious abnormality, atraumatic Back: symmetric, no curvature. ROM normal. No CVA tenderness. Resp: clear to auscultation bilaterally Cardio: regular rate and rhythm, S1, S2 normal, no murmur, click, rub or gallop GI: soft, non-tender; bowel sounds normal; no masses,  no organomegaly Extremities: extremities normal, atraumatic, no cyanosis or edema Pulses: 2+ and symmetric Skin: Skin color, texture, turgor normal. No rashes or lesions Neurologic: Grossly normal  Lab Results: Recent Labs    11/11/19 0742 11/11/19 0742 11/11/19 1648 11/12/19 0415  WBC 20.4*  --   --  14.7*  HGB 7.1*   < > 8.4* 8.0*  HCT 21.2*   < > 25.7* 24.5*  PLT 206  --   --  197   < > = values in this interval not displayed.   BMET Recent Labs    11/12/19 0415 11/13/19 1323  NA 138 138  K  2.5* 3.2*  CL 107 108  CO2 22 24  GLUCOSE 160* 109*  BUN 18 15  CREATININE 1.11* 0.96  CALCIUM 8.3* 8.3*    Studies/Results: No results found.  Medications: I have reviewed the patient's current medications.  Assessment/Plan: A 39 year old female with history of sickle cell disease admitted with sickle cell painful crisis.  #1 sickle cell anemia with crisis: Patient continues to have significant pain on her Dilaudid PCA with home OxyContin and Tylenol.  I will continue with her home regimen.  Continue his current regimen also of Dilaudid PCA and hold Toradol due to AKI.  Counseling provided.  As needed medication with Dilaudid may be added if pain is persistent..  #2 acute chest syndrome: Continue with IV antibiotics.  On cefepime at the moment.  Valtrex is probably an old order.  Will complete antibiotics probably today.  Monitoring closely.  She has healthcare associated pneumonia most likely.  #3 chronic pain syndrome: Again continue with long-acting medications.  #4 acute kidney injury: Appears to be improving.  Hydration.  Avoid nonsteroidals at this point.  #5 hypertension: Appears controlled.  #6 leukocytosis: Most likely due to pneumonia and acute chest syndrome  #7 transaminitis: Continue to hydrate and monitor.  #8 hypokalemia: Replete potassium.   LOS: 6 days   Marshe Shrestha,LAWAL 11/13/2019, 10:58 PM

## 2019-11-14 DIAGNOSIS — R0902 Hypoxemia: Secondary | ICD-10-CM | POA: Diagnosis not present

## 2019-11-14 DIAGNOSIS — A419 Sepsis, unspecified organism: Secondary | ICD-10-CM | POA: Diagnosis not present

## 2019-11-14 DIAGNOSIS — D5700 Hb-SS disease with crisis, unspecified: Secondary | ICD-10-CM | POA: Diagnosis not present

## 2019-11-14 DIAGNOSIS — J189 Pneumonia, unspecified organism: Secondary | ICD-10-CM | POA: Diagnosis not present

## 2019-11-14 LAB — CBC WITH DIFFERENTIAL/PLATELET
Abs Immature Granulocytes: 0.07 10*3/uL (ref 0.00–0.07)
Basophils Absolute: 0 10*3/uL (ref 0.0–0.1)
Basophils Relative: 0 %
Eosinophils Absolute: 0.3 10*3/uL (ref 0.0–0.5)
Eosinophils Relative: 3 %
HCT: 23.9 % — ABNORMAL LOW (ref 36.0–46.0)
Hemoglobin: 7.9 g/dL — ABNORMAL LOW (ref 12.0–15.0)
Immature Granulocytes: 1 %
Lymphocytes Relative: 13 %
Lymphs Abs: 1.5 10*3/uL (ref 0.7–4.0)
MCH: 27.1 pg (ref 26.0–34.0)
MCHC: 33.1 g/dL (ref 30.0–36.0)
MCV: 81.8 fL (ref 80.0–100.0)
Monocytes Absolute: 0.7 10*3/uL (ref 0.1–1.0)
Monocytes Relative: 7 %
Neutro Abs: 8.7 10*3/uL — ABNORMAL HIGH (ref 1.7–7.7)
Neutrophils Relative %: 76 %
Platelets: 199 10*3/uL (ref 150–400)
RBC: 2.92 MIL/uL — ABNORMAL LOW (ref 3.87–5.11)
RDW: 21.6 % — ABNORMAL HIGH (ref 11.5–15.5)
WBC: 11.3 10*3/uL — ABNORMAL HIGH (ref 4.0–10.5)
nRBC: 5 % — ABNORMAL HIGH (ref 0.0–0.2)

## 2019-11-14 LAB — COMPREHENSIVE METABOLIC PANEL
ALT: 22 U/L (ref 0–44)
AST: 17 U/L (ref 15–41)
Albumin: 2.2 g/dL — ABNORMAL LOW (ref 3.5–5.0)
Alkaline Phosphatase: 64 U/L (ref 38–126)
Anion gap: 10 (ref 5–15)
BUN: 17 mg/dL (ref 6–20)
CO2: 22 mmol/L (ref 22–32)
Calcium: 8.7 mg/dL — ABNORMAL LOW (ref 8.9–10.3)
Chloride: 108 mmol/L (ref 98–111)
Creatinine, Ser: 0.86 mg/dL (ref 0.44–1.00)
GFR, Estimated: >60 mL/min (ref >60)
Glucose, Bld: 132 mg/dL — ABNORMAL HIGH (ref 70–99)
Potassium: 3.2 mmol/L — ABNORMAL LOW (ref 3.5–5.1)
Sodium: 140 mmol/L (ref 135–145)
Total Bilirubin: 1 mg/dL (ref 0.3–1.2)
Total Protein: 6.8 g/dL (ref 6.5–8.1)

## 2019-11-14 MED ORDER — OXYCODONE HCL 5 MG PO TABS
10.0000 mg | ORAL_TABLET | ORAL | Status: DC | PRN
  Administered 2019-11-14 – 2019-11-15 (×2): 10 mg via ORAL
  Filled 2019-11-14 (×2): qty 2

## 2019-11-14 NOTE — Progress Notes (Addendum)
   11/14/19 1328  Vitals  Temp 98 F (36.7 C)  Temp Source Oral  BP (!) 99/58  MAP (mmHg) 70  BP Location Left Arm  BP Method Automatic  Patient Position (if appropriate) Lying  Pulse Rate (!) 106  Pulse Rate Source Monitor  Resp 16  Level of Consciousness  Level of Consciousness Alert  MEWS COLOR  MEWS Score Color Yellow  Oxygen Therapy  SpO2 100 %  MEWS Score  MEWS Temp 0  MEWS Systolic 1  MEWS Pulse 1  MEWS RR 0  MEWS LOC 0  MEWS Score 2   Pt. Is alert in no acute distress, watching TV, pain is at 7/10. No complaints made. MEWS guideline activated, CN notified, MD notified. Continue V/S monitoring. Will closely monitor patient.

## 2019-11-14 NOTE — Progress Notes (Signed)
Subjective: Stephanie Burch is a 39 year old female with a medical history significant for sickle cell disease, chronic pain syndrome, opiate dependence and tolerance, morbid obesity, history of depression, and history of anemia of chronic disease was admitted pneumonia in the setting of sickle cell pain crisis.  Today, patient continues to complain of significant pain primarily to low back and lower extremities.  She rates pain as 8/10.  Patient has not attempted to get out of bed without assistance.  She endorses some weakness.  She denies headache, chest pain, urinary symptoms, nausea, vomiting, or diarrhea.  She endorses some shortness of breath with exertion.   Objective:  Vital signs in last 24 hours:  Vitals:   11/14/19 0834 11/14/19 0944 11/14/19 1135 11/14/19 1328  BP:  110/68  (!) 99/58  Pulse:  97  (!) 106  Resp: (!) 26 19 19 16   Temp:  98.6 F (37 C)  98 F (36.7 C)  TempSrc:  Oral  Oral  SpO2: 100% 97% 97% 100%  Weight:      Height:        Intake/Output from previous day:   Intake/Output Summary (Last 24 hours) at 11/14/2019 1406 Last data filed at 11/14/2019 0229 Gross per 24 hour  Intake 720 ml  Output 3000 ml  Net -2280 ml    Physical Exam: General: Alert, awake, oriented x3, in no acute distress.  HEENT: [place]/AT PEERL, EOMI Neck: Trachea midline,  no masses, no thyromegal,y no JVD, no carotid bruit OROPHARYNX:  Moist, No exudate/ erythema/lesions.  Heart: Regular rate and rhythm, without murmurs, rubs, gallops, PMI non-displaced, no heaves or thrills on palpation.  Lungs: Clear to auscultation, no wheezing or rhonchi noted. No increased vocal fremitus resonant to percussion  Abdomen: Soft, nontender, nondistended, positive bowel sounds, no masses no hepatosplenomegaly noted..  Neuro: No focal neurological deficits noted cranial nerves II through XII grossly intact. DTRs 2+ bilaterally upper and lower extremities. Strength 5 out of 5 in bilateral upper and lower  extremities. Musculoskeletal: No warm swelling or erythema around joints, no spinal tenderness noted. Psychiatric: Patient alert and oriented x3, good insight and cognition, good recent to remote recall. Lymph node survey: No cervical axillary or inguinal lymphadenopathy noted.  Lab Results:  Basic Metabolic Panel:    Component Value Date/Time   NA 140 11/14/2019 0315   NA 142 09/11/2017 1431   K 3.2 (L) 11/14/2019 0315   CL 108 11/14/2019 0315   CO2 22 11/14/2019 0315   BUN 17 11/14/2019 0315   BUN 8 09/11/2017 1431   CREATININE 0.86 11/14/2019 0315   GLUCOSE 132 (H) 11/14/2019 0315   CALCIUM 8.7 (L) 11/14/2019 0315   CBC:    Component Value Date/Time   WBC 11.3 (H) 11/14/2019 0315   HGB 7.9 (L) 11/14/2019 0315   HGB 13.0 09/11/2017 1431   HCT 23.9 (L) 11/14/2019 0315   HCT 38.4 09/11/2017 1431   PLT 199 11/14/2019 0315   PLT 258 09/11/2017 1431   MCV 81.8 11/14/2019 0315   MCV 75 (L) 09/11/2017 1431   NEUTROABS 8.7 (H) 11/14/2019 0315   NEUTROABS 3.9 09/11/2017 1431   LYMPHSABS 1.5 11/14/2019 0315   LYMPHSABS 2.2 09/11/2017 1431   MONOABS 0.7 11/14/2019 0315   EOSABS 0.3 11/14/2019 0315   EOSABS 0.1 09/11/2017 1431   BASOSABS 0.0 11/14/2019 0315   BASOSABS 0.0 09/11/2017 1431    Recent Results (from the past 240 hour(s))  Blood Culture (routine x 2)  Status: None   Collection Time: 11/07/19 11:50 AM   Specimen: BLOOD  Result Value Ref Range Status   Specimen Description   Final    BLOOD RIGHT ANTECUBITAL Performed at [facility], [address], [place], [place] [address]    Special Requests   Final    BOTTLES DRAWN AEROBIC AND ANAEROBIC Blood Culture adequate volume Performed at [facility], [address] [place], [place] [address]    Culture   Final    NO GROWTH 5 DAYS Performed at [facility], [address] [place], [place] [address]    Report Status 11/12/2019 FINAL  Final  Blood Culture (routine x 2)      Status: None   Collection Time: 11/07/19  4:55 PM   Specimen: BLOOD RIGHT HAND  Result Value Ref Range Status   Specimen Description   Final    BLOOD RIGHT HAND Performed at [facility], [address] [place], [place] [address]    Special Requests   Final    BOTTLES DRAWN AEROBIC ONLY Blood Culture adequate volume Performed at [facility], [address], [place], [place] [address]    Culture   Final    NO GROWTH 5 DAYS Performed at [facility], [address] [place], [place] [address]    Report Status 11/12/2019 FINAL  Final  Culture, blood (single)     Status: None   Collection Time: 11/07/19  4:55 PM   Specimen: BLOOD LEFT HAND  Result Value Ref Range Status   Specimen Description   Final    BLOOD LEFT HAND Performed at [facility], [address] [place], [place] [address]    Special Requests   Final    BOTTLES DRAWN AEROBIC ONLY Blood Culture adequate volume Performed at [facility], [address], [place], [place] [address]    Culture   Final    NO GROWTH 5 DAYS Performed at [facility], [address], [place], [place] [address]    Report Status 11/12/2019 FINAL  Final  MRSA PCR Screening     Status: None   Collection Time: 11/09/19  4:09 PM   Specimen: Nasal Mucosa; Nasopharyngeal  Result Value Ref Range Status   MRSA by PCR NEGATIVE NEGATIVE Final    Comment:        The GeneXpert MRSA Assay (FDA approved for NASAL specimens only), is one component of a comprehensive MRSA colonization surveillance program. It is not intended to diagnose MRSA infection nor to guide or monitor treatment for MRSA infections. Performed at [facility], [address], [place], [place] [address]     Studies/Results: No results found.  Medications: Scheduled Meds:  sodium chloride   Intravenous Once   Chlorhexidine Gluconate Cloth  6 each Topical Daily    DULoxetine  20 mg Oral Daily   enoxaparin (LOVENOX) injection  40 mg Subcutaneous Q24H   folic acid  1 mg Oral Daily   HYDROmorphone   Intravenous Q4H   hydroxyurea  2,000 mg Oral Daily   metoprolol tartrate  12.5 mg Oral BID   oxyCODONE  20 mg Oral Q12H   potassium chloride  40 mEq Oral BID   senna-docusate  1 tablet Oral BID   valACYclovir  500 mg Oral BID   Vitamin D (Ergocalciferol)  50,000 Units Oral Q7 days   voxelotor  1,500 mg Oral Daily   Continuous  Infusions:  diphenhydrAMINE     PRN Meds:.acetaminophen, albuterol, diphenhydrAMINE **OR** diphenhydrAMINE, naloxone **AND** sodium chloride flush, ondansetron (ZOFRAN) IV, oxyCODONE, polyethylene glycol, sodium chloride flush, tiZANidine  Consultants:  None  Procedures:  None  Antibiotics:  Discontinue  Assessment/Plan: Principal Problem:   Sickle cell pain crisis (HCC) Active Problems:   Sickle cell crisis (HCC)   Leukocytosis   Tachycardia with heart rate 100-120 beats per minute   Chronic pain syndrome   Hypokalemia   Anxiety   HCAP (healthcare-associated pneumonia)   Sepsis (HCC)   Acute kidney injury (HCC)   History of COVID-19   Poor venous access   Essential hypertension   Hypoxia   Pulmonary edema  HCAP vs Acute chest syndrome:  Improving.  IV antibiotics discontinued.  Oxygen saturation is 100% on 4 L, titrate as tolerated. Today, hemoglobin 7.9.  No transfusion needed at this time. Continue duo nebulizer every 6 hours as needed Tylenol 650 mg every 4 hours as needed for fever.  Sickle cell disease with pain crisis: Continue IV Dilaudid PCA without changes in settings..  Restart home oxycodone 20 mg every 4 hours as needed for severe breakthrough pain Continue to hold IV Toradol due to AKI.  Chronic pain syndrome: Continue home medications  Acute kidney injury: Resolved.  Continue gentle hydration.  Continue to avoid nephrotoxins at this  point.  Hypertension: Controlled Leukocytosis: Secondary to pneumonia with acute chest syndrome.  Continue to monitor closely.  Improved from previous.  Repeat CBC in a.m.  Transaminitis: Continue to hydrate and monitor closely  Hypokalemia: Potassium repleted.  Continue to follow closely.   Code Status: Full Code Family Communication: N/A Disposition Plan: Not yet ready for discharge  Javonn Gauger Rennis Petty  APRN, MSN, FNP-C [facility] [address] [place], [place] [address] [phone]  If 5PM-8AM, please contact night-coverage.  11/14/2019, 2:06 PM  LOS: 7 days

## 2019-11-14 NOTE — Progress Notes (Signed)
Daughter left patient bedside around 1030pm.Patient rec'd prn ativan prior to daughters leaving. Patient cont to be awake, fidgeting. No c/o of pain at this time. Reassure daughters will call with update once patient appears more relaxed.  Misty Stanley (daughter) called. Patient more relaxed however remains awake, no pain at this time, requesting and rec'd ice chips.

## 2019-11-14 NOTE — Plan of Care (Signed)
  Problem: Education: Goal: Knowledge of General Education information will improve Description: Including pain rating scale, medication(s)/side effects and non-pharmacologic comfort measures Outcome: Progressing   Problem: Clinical Measurements: Goal: Ability to maintain clinical measurements within normal limits will improve Outcome: Progressing   

## 2019-11-15 ENCOUNTER — Inpatient Hospital Stay (HOSPITAL_COMMUNITY): Payer: Medicaid Other

## 2019-11-15 ENCOUNTER — Ambulatory Visit: Payer: Self-pay

## 2019-11-15 DIAGNOSIS — I509 Heart failure, unspecified: Secondary | ICD-10-CM | POA: Diagnosis not present

## 2019-11-15 DIAGNOSIS — D5700 Hb-SS disease with crisis, unspecified: Secondary | ICD-10-CM | POA: Diagnosis not present

## 2019-11-15 LAB — CBC
HCT: 23.1 % — ABNORMAL LOW (ref 36.0–46.0)
Hemoglobin: 7.6 g/dL — ABNORMAL LOW (ref 12.0–15.0)
MCH: 27 pg (ref 26.0–34.0)
MCHC: 32.9 g/dL (ref 30.0–36.0)
MCV: 82.2 fL (ref 80.0–100.0)
Platelets: 205 10*3/uL (ref 150–400)
RBC: 2.81 MIL/uL — ABNORMAL LOW (ref 3.87–5.11)
RDW: 21.7 % — ABNORMAL HIGH (ref 11.5–15.5)
WBC: 10 10*3/uL (ref 4.0–10.5)
nRBC: 2.8 % — ABNORMAL HIGH (ref 0.0–0.2)

## 2019-11-15 LAB — ECHOCARDIOGRAM COMPLETE
Area-P 1/2: 3.83 cm2
Height: 65 in
S' Lateral: 3.25 cm
Weight: 4480 oz

## 2019-11-15 LAB — BASIC METABOLIC PANEL
Anion gap: 6 (ref 5–15)
BUN: 12 mg/dL (ref 6–20)
CO2: 25 mmol/L (ref 22–32)
Calcium: 8.4 mg/dL — ABNORMAL LOW (ref 8.9–10.3)
Chloride: 106 mmol/L (ref 98–111)
Creatinine, Ser: 0.84 mg/dL (ref 0.44–1.00)
GFR, Estimated: >60 mL/min (ref >60)
Glucose, Bld: 95 mg/dL (ref 70–99)
Potassium: 3.5 mmol/L (ref 3.5–5.1)
Sodium: 137 mmol/L (ref 135–145)

## 2019-11-15 MED ORDER — OXYCODONE HCL 5 MG PO TABS
20.0000 mg | ORAL_TABLET | ORAL | Status: DC | PRN
  Administered 2019-11-15 – 2019-11-18 (×11): 20 mg via ORAL
  Filled 2019-11-15 (×11): qty 4

## 2019-11-15 MED ORDER — HYDROMORPHONE 1 MG/ML IV SOLN
INTRAVENOUS | Status: DC
  Administered 2019-11-15: 5 mg via INTRAVENOUS
  Administered 2019-11-15: 7.8 mg via INTRAVENOUS
  Administered 2019-11-15: 4.5 mg via INTRAVENOUS
  Administered 2019-11-16 (×2): 7.5 mg via INTRAVENOUS
  Administered 2019-11-16: 2.5 mg via INTRAVENOUS
  Administered 2019-11-16: 6 mg via INTRAVENOUS
  Administered 2019-11-16: 2.7 mg via INTRAVENOUS
  Administered 2019-11-16: 30 mg via INTRAVENOUS
  Administered 2019-11-16: 4.5 mg via INTRAVENOUS
  Administered 2019-11-17: 6 mg via INTRAVENOUS
  Administered 2019-11-17: 1 mg via INTRAVENOUS
  Filled 2019-11-15 (×2): qty 30

## 2019-11-15 NOTE — BH Specialist Note (Signed)
Integrated Behavioral Health Note  Session Start time: 10:45   End Time: 11:45 Total Time: 60 Type of Service: Behavioral Health - Individual/Family Interpreter: No.   Interpreter Name & Language: none  Session #: 14/20  SUBJECTIVE: Stephanie Burch is a 39 y.o. female brought in by patient.  Pt./Family was referred by NP for:  stress precipitating sickle cell crisis, recently also for overuse of pain medications. Pt./Family reports the following symptoms/concerns: difficulty with illness acceptance and living well with pain, recently increased anxiety and fear of illness Duration of problem: several years  Severity: moderate  OBJECTIVE: Mood: Euthymic & Affect: Appropriate Risk of harm to self or others: none Assessments administered: none  LIFE CONTEXT:  Family & Social:lives independently, co-parents her dependent children with the children's father School/ Work:currently not employed Self-Care:reads, listens to music, watches motivational videos, positive self-talk Life changes:recent break-up with partner What is important to pt/family (values):her children, being independent/strong, working  Loss adjuster, chartered (Arts administrator): Social connections and [place] supports in place (healthy food, safe environments, etc.)  ASSESSMENT: Patient currently experiencing stress and anxiety related to recent experience with COVID and then subsequent conditions that led to current hospitlaization. She reports significant fear and worry about the symptoms she was experiencing and worries it will happen again.  While sick before admitting to the hospital, patient felt unable to care for herself and worries about this happening in the future.  GOALS ADDRESSED: Patient will: 1.  Reduce symptoms of: anxiety and stress  2.  Increase knowledge and/or ability of: coping skills and self-management skills  3.  Demonstrate ability to: Increase healthy adjustment to current life  circumstances   Progress of Goals: Ongoing  INTERVENTIONS: Interventions utilized:  Mindfulness or Relaxation Training and Supportive Counseling Standardized Assessments completed & reviewed: Not Needed  Supportive counseling and assessment of patient's recent experiences. Reflection on how physical illness and emotional reaction to it, as well as a new, unrelated social stressor may have contributed to increased anxiety and fear. Practiced mindfulness exercise for grounding and calming. Will plan to follow up briefly with patient tomorrow.  OUTCOME: Patient Response: Patient engaged in session, participated in mindfulness exercise. Is amenable to follow up tomorrow.    PLAN: 1. Follow up with behavioral health clinician on: 11/16/19 2. Behavioral recommendations:  3. Referral(s): Integrated [employer] (In Clinic)   Abigail Butts, LCSW [facility] [phone]

## 2019-11-15 NOTE — Progress Notes (Signed)
Echocardiogram 2D Echocardiogram has been performed.  Leta Jungling M 11/15/2019, 1:34 PM

## 2019-11-15 NOTE — Progress Notes (Signed)
Subjective: Stephanie Burch is a 39 year old female with a medical history significant for sickle cell disease, chronic pain syndrome, opiate dependence and tolerance, morbid obesity, history of depression, and history of anemia of chronic disease was admitted for pneumonia in setting of sickle cell pain crisis.  Patient has no new complaints on today.  However, she continues to complain of pain primarily to low back and lower extremities.  She rates pain as 8/10.  Patient has been getting up to bedside commode without assistance.  She denies any headache, chest pain, urinary symptoms, nausea, vomiting, or diarrhea.  Shortness of breath with exertion has improved some.  Objective:  Vital signs in last 24 hours:  Vitals:   11/15/19 1036 11/15/19 1216 11/15/19 1224 11/15/19 1435  BP: 110/68 119/61  110/66  Pulse: 79 98  97  Resp: 13 16 19 13   Temp: 98.1 F (36.7 C) 97.7 F (36.5 C)  98.1 F (36.7 C)  TempSrc: Oral Oral  Oral  SpO2: (!) 77% 100% 100% 100%  Weight:      Height:        Intake/Output from previous day:   Intake/Output Summary (Last 24 hours) at 11/15/2019 1545 Last data filed at 11/15/2019 1400 Gross per 24 hour  Intake 320 ml  Output 1900 ml  Net -1580 ml    Physical Exam: General: Alert, awake, oriented x3, in no acute distress.  HEENT: [place]/AT PEERL, EOMI Neck: Trachea midline,  no masses, no thyromegal,y no JVD, no carotid bruit OROPHARYNX:  Moist, No exudate/ erythema/lesions.  Heart: Regular rate and rhythm, without murmurs, rubs, gallops, PMI non-displaced, no heaves or thrills on palpation.  Lungs: Clear to auscultation, no wheezing or rhonchi noted. No increased vocal fremitus resonant to percussion  Abdomen: Soft, nontender, nondistended, positive bowel sounds, no masses no hepatosplenomegaly noted..  Neuro: No focal neurological deficits noted cranial nerves II through XII grossly intact. DTRs 2+ bilaterally upper and lower extremities. Strength 5 out of 5  in bilateral upper and lower extremities. Musculoskeletal: No warm swelling or erythema around joints, no spinal tenderness noted. Psychiatric: Patient alert and oriented x3, good insight and cognition, good recent to remote recall. Lymph node survey: No cervical axillary or inguinal lymphadenopathy noted.  Lab Results:  Basic Metabolic Panel:    Component Value Date/Time   NA 137 11/15/2019 0939   NA 142 09/11/2017 1431   K 3.5 11/15/2019 0939   CL 106 11/15/2019 0939   CO2 25 11/15/2019 0939   BUN 12 11/15/2019 0939   BUN 8 09/11/2017 1431   CREATININE 0.84 11/15/2019 0939   GLUCOSE 95 11/15/2019 0939   CALCIUM 8.4 (L) 11/15/2019 0939   CBC:    Component Value Date/Time   WBC 10.0 11/15/2019 0939   HGB 7.6 (L) 11/15/2019 0939   HGB 13.0 09/11/2017 1431   HCT 23.1 (L) 11/15/2019 0939   HCT 38.4 09/11/2017 1431   PLT 205 11/15/2019 0939   PLT 258 09/11/2017 1431   MCV 82.2 11/15/2019 0939   MCV 75 (L) 09/11/2017 1431   NEUTROABS 8.7 (H) 11/14/2019 0315   NEUTROABS 3.9 09/11/2017 1431   LYMPHSABS 1.5 11/14/2019 0315   LYMPHSABS 2.2 09/11/2017 1431   MONOABS 0.7 11/14/2019 0315   EOSABS 0.3 11/14/2019 0315   EOSABS 0.1 09/11/2017 1431   BASOSABS 0.0 11/14/2019 0315   BASOSABS 0.0 09/11/2017 1431    Recent Results (from the past 240 hour(s))  Blood Culture (routine x 2)     Status:  None   Collection Time: 11/07/19 11:50 AM   Specimen: BLOOD  Result Value Ref Range Status   Specimen Description   Final    BLOOD RIGHT ANTECUBITAL Performed at [facility], [address] [place], [place] [address]    Special Requests   Final    BOTTLES DRAWN AEROBIC AND ANAEROBIC Blood Culture adequate volume Performed at [facility], [address], [place], [place] [address]    Culture   Final    NO GROWTH 5 DAYS Performed at [facility], [address], [place], [place] [address]    Report Status 11/12/2019 FINAL  Final  Blood Culture  (routine x 2)     Status: None   Collection Time: 11/07/19  4:55 PM   Specimen: BLOOD RIGHT HAND  Result Value Ref Range Status   Specimen Description   Final    BLOOD RIGHT HAND Performed at [facility], [address], [place], [place] [address]    Special Requests   Final    BOTTLES DRAWN AEROBIC ONLY Blood Culture adequate volume Performed at [facility], [address] [place], [place] [address]    Culture   Final    NO GROWTH 5 DAYS Performed at [facility], [address] [place], [place] [address]    Report Status 11/12/2019 FINAL  Final  Culture, blood (single)     Status: None   Collection Time: 11/07/19  4:55 PM   Specimen: BLOOD LEFT HAND  Result Value Ref Range Status   Specimen Description   Final    BLOOD LEFT HAND Performed at [facility], [address], [place], [place] [address]    Special Requests   Final    BOTTLES DRAWN AEROBIC ONLY Blood Culture adequate volume Performed at [facility], [address], [place], [place] [address]    Culture   Final    NO GROWTH 5 DAYS Performed at [facility], [address], [place], [place] [address]    Report Status 11/12/2019 FINAL  Final  MRSA PCR Screening     Status: None   Collection Time: 11/09/19  4:09 PM   Specimen: Nasal Mucosa; Nasopharyngeal  Result Value Ref Range Status   MRSA by PCR NEGATIVE NEGATIVE Final    Comment:        The GeneXpert MRSA Assay (FDA approved for NASAL specimens only), is one component of a comprehensive MRSA colonization surveillance program. It is not intended to diagnose MRSA infection nor to guide or monitor treatment for MRSA infections. Performed at [facility], [address] [place], [place] [address]     Studies/Results: ECHOCARDIOGRAM COMPLETE  Result Date: 11/15/2019    ECHOCARDIOGRAM REPORT   Patient Name:   [facility] Date of Exam:  11/15/2019 Medical Rec #:  [id number]     Height:       65.0 in Accession #:    [id number]    Weight:       280.0 lb Date of Birth:  April 10, 1980    BSA:          2.282 m Patient Age:    38 years      BP:           119/61 mmHg Patient Gender: F             HR:           98  bpm. Exam Location:  Inpatient Procedure: 2D Echo Indications:    Congestive Heart Failure 428.0 / I50.9  History:        Patient has no prior history of Echocardiogram examinations.                 COVID-19 10/20/19, Sickle cell anemia with crisis, Acute chest                 syndrome, Chronic pain syndrome, Chronic pain syndrome,                 Leukocytosis.  Sonographer:    Leta Jungling RDCS Referring Phys: [id number] OLUGBEMIGA E JEGEDE  Sonographer Comments: Echo performed with patient laying on her right side IMPRESSIONS  1. Left ventricular ejection fraction, by estimation, is 60 to 65%. The left ventricle has normal function. The left ventricle has no regional wall motion abnormalities. Left ventricular diastolic parameters were normal.  2. Right ventricular systolic function is normal. The right ventricular size is normal.  3. The mitral valve is normal in structure. No evidence of mitral valve regurgitation. No evidence of mitral stenosis.  4. The aortic valve is normal in structure. Aortic valve regurgitation is not visualized. No aortic stenosis is present.  5. The inferior vena cava is normal in size with greater than 50% respiratory variability, suggesting right atrial pressure of 3 mmHg. FINDINGS  Left Ventricle: Left ventricular ejection fraction, by estimation, is 60 to 65%. The left ventricle has normal function. The left ventricle has no regional wall motion abnormalities. The left ventricular internal cavity size was normal in size. There is  no left ventricular hypertrophy. Left ventricular diastolic parameters were normal. Right Ventricle: The right ventricular size is normal. No increase in right ventricular wall thickness.  Right ventricular systolic function is normal. Left Atrium: Left atrial size was normal in size. Right Atrium: Right atrial size was normal in size. Pericardium: There is no evidence of pericardial effusion. Mitral Valve: The mitral valve is normal in structure. No evidence of mitral valve regurgitation. No evidence of mitral valve stenosis. Tricuspid Valve: The tricuspid valve is normal in structure. Tricuspid valve regurgitation is not demonstrated. No evidence of tricuspid stenosis. Aortic Valve: The aortic valve is normal in structure. Aortic valve regurgitation is not visualized. No aortic stenosis is present. Pulmonic Valve: The pulmonic valve was normal in structure. Pulmonic valve regurgitation is not visualized. No evidence of pulmonic stenosis. Aorta: The aortic root is normal in size and structure. Venous: The inferior vena cava is normal in size with greater than 50% respiratory variability, suggesting right atrial pressure of 3 mmHg. IAS/Shunts: No atrial level shunt detected by color flow Doppler.  LEFT VENTRICLE PLAX 2D LVIDd:         4.86 cm  Diastology LVIDs:         3.25 cm  LV e' medial:    6.85 cm/s LV PW:         1.19 cm  LV E/e' medial:  14.2 LV IVS:        1.13 cm  LV e' lateral:   8.05 cm/s LVOT diam:     2.40 cm  LV E/e' lateral: 12.1 LV SV:         76 LV SV Index:   33 LVOT Area:     4.52 cm  RIGHT VENTRICLE TAPSE (M-mode): 1.3 cm LEFT ATRIUM             Index LA diam:  3.50 cm 1.53 cm/m LA Vol (A2C):   38.2 ml 16.74 ml/m LA Vol (A4C):   41.4 ml 18.14 ml/m LA Biplane Vol: 42.7 ml 18.71 ml/m  AORTIC VALVE LVOT Vmax:   113.00 cm/s LVOT Vmean:  68.600 cm/s LVOT VTI:    0.168 m  AORTA Ao Root diam: 3.40 cm MITRAL VALVE MV Area (PHT): 3.83 cm    SHUNTS MV Decel Time: 198 msec    Systemic VTI:  0.17 m MV E velocity: 97.40 cm/s  Systemic Diam: 2.40 cm MV A velocity: 91.30 cm/s MV E/A ratio:  1.07 Mihai Croitoru MD Electronically signed by Thurmon FairMihai Croitoru MD Signature Date/Time:  11/15/2019/1:45:14 PM    Final     Medications: Scheduled Meds: . Chlorhexidine Gluconate Cloth  6 each Topical Daily  . DULoxetine  20 mg Oral Daily  . enoxaparin (LOVENOX) injection  40 mg Subcutaneous Q24H  . folic acid  1 mg Oral Daily  . HYDROmorphone   Intravenous Q4H  . hydroxyurea  2,000 mg Oral Daily  . metoprolol tartrate  12.5 mg Oral BID  . oxyCODONE  20 mg Oral Q12H  . senna-docusate  1 tablet Oral BID  . valACYclovir  500 mg Oral BID  . Vitamin D (Ergocalciferol)  50,000 Units Oral Q7 days  . voxelotor  1,500 mg Oral Daily   Continuous Infusions: . diphenhydrAMINE     PRN Meds:.acetaminophen, albuterol, diphenhydrAMINE **OR** diphenhydrAMINE, naloxone **AND** sodium chloride flush, ondansetron (ZOFRAN) IV, oxyCODONE, polyethylene glycol, sodium chloride flush, tiZANidine  Consultants:  None  Procedures:  Echocardiogram  Antibiotics:  None  Assessment/Plan: Principal Problem:   Sickle cell pain crisis (HCC) Active Problems:   Sickle cell crisis (HCC)   Leukocytosis   Tachycardia with heart rate 100-120 beats per minute   Chronic pain syndrome   Hypokalemia   Anxiety   HCAP (healthcare-associated pneumonia)   Sepsis (HCC)   Acute kidney injury (HCC)   History of COVID-19   Poor venous access   Essential hypertension   Hypoxia   Pulmonary edema  HCAP versus acute chest syndrome: Improving.  IV antibiotics were discontinued.  Weaning oxygen as tolerated.  Ambulating pulse ox on today. Also, echocardiogram today. Continue duo nebulizer every 6 hours as needed Tylenol 650 mg every 4 hours as needed for fever  Sickle cell disease with pain crisis: Continue IV Dilaudid PCA, settings decreased some on today. Oxycodone 20 mg every 4 hours as needed for severe breakthrough pain Monitor vital signs closely, reevaluate pain scale regularly, and supplemental oxygen as needed.  Chronic pain syndrome: Continue home medications  Acute kidney  injury: Resolved.  Continue to avoid nephrotoxins at this point. Hypertension: Controlled.  Continue metoprolol 12.5 mg every 12 hours  Leukocytosis: Improving.  Stable.  Continue to monitor closely.  Follow CBC.  Transaminitis: Resolving.  Continue to hydrate and monitor closely  Hypokalemia: Resolved.  Potassium repleted.  Continue to follow closely.  Code Status: Full Code Family Communication: N/A Disposition Plan: Not yet ready for discharge  Miciah Covelli Rennis PettyMoore Butler Vegh  APRN, MSN, FNP-C [facility] [address]  [place], [place] [id number] [phone]  If 5PM-8AM, please contact night-coverage.  11/15/2019, 3:45 PM  LOS: 8 days

## 2019-11-16 ENCOUNTER — Inpatient Hospital Stay (HOSPITAL_COMMUNITY): Payer: Medicaid Other

## 2019-11-16 DIAGNOSIS — A419 Sepsis, unspecified organism: Secondary | ICD-10-CM | POA: Diagnosis not present

## 2019-11-16 DIAGNOSIS — J189 Pneumonia, unspecified organism: Secondary | ICD-10-CM | POA: Diagnosis not present

## 2019-11-16 DIAGNOSIS — J90 Pleural effusion, not elsewhere classified: Secondary | ICD-10-CM | POA: Diagnosis not present

## 2019-11-16 DIAGNOSIS — N179 Acute kidney failure, unspecified: Secondary | ICD-10-CM | POA: Diagnosis not present

## 2019-11-16 DIAGNOSIS — G894 Chronic pain syndrome: Secondary | ICD-10-CM | POA: Diagnosis not present

## 2019-11-16 DIAGNOSIS — D5700 Hb-SS disease with crisis, unspecified: Secondary | ICD-10-CM | POA: Diagnosis not present

## 2019-11-16 DIAGNOSIS — J9811 Atelectasis: Secondary | ICD-10-CM | POA: Diagnosis not present

## 2019-11-16 DIAGNOSIS — R0902 Hypoxemia: Secondary | ICD-10-CM | POA: Diagnosis not present

## 2019-11-16 DIAGNOSIS — F419 Anxiety disorder, unspecified: Secondary | ICD-10-CM | POA: Diagnosis not present

## 2019-11-16 LAB — CBC
HCT: 23 % — ABNORMAL LOW (ref 36.0–46.0)
Hemoglobin: 7.6 g/dL — ABNORMAL LOW (ref 12.0–15.0)
MCH: 27.4 pg (ref 26.0–34.0)
MCHC: 33 g/dL (ref 30.0–36.0)
MCV: 83 fL (ref 80.0–100.0)
Platelets: 196 10*3/uL (ref 150–400)
RBC: 2.77 MIL/uL — ABNORMAL LOW (ref 3.87–5.11)
RDW: 21.7 % — ABNORMAL HIGH (ref 11.5–15.5)
WBC: 9.2 10*3/uL (ref 4.0–10.5)
nRBC: 2.3 % — ABNORMAL HIGH (ref 0.0–0.2)

## 2019-11-16 LAB — BASIC METABOLIC PANEL
Anion gap: 8 (ref 5–15)
BUN: 9 mg/dL (ref 6–20)
CO2: 23 mmol/L (ref 22–32)
Calcium: 8.2 mg/dL — ABNORMAL LOW (ref 8.9–10.3)
Chloride: 106 mmol/L (ref 98–111)
Creatinine, Ser: 0.76 mg/dL (ref 0.44–1.00)
GFR, Estimated: >60 mL/min (ref >60)
Glucose, Bld: 96 mg/dL (ref 70–99)
Potassium: 3.3 mmol/L — ABNORMAL LOW (ref 3.5–5.1)
Sodium: 137 mmol/L (ref 135–145)

## 2019-11-16 NOTE — Progress Notes (Signed)
Subjective: Stephanie Burch is a 39 year old female with a medical history significant for sickle cell disease, chronic pain syndrome, opiate dependence and tolerance, morbid obesity, history of depression and anxiety, and history of anemia of chronic disease was admitted for pneumonia in the setting of sickle cell pain crisis.  Patient continues to complain of pain primarily to low back and lower extremities.  Patient has attempted to ambulate with assistance.  She endorses shortness of breath with exertion.  Chest x-ray shows persistent bilateral pleural effusions with bibasilar atelectasis and/or airspace disease.  Also, echocardiogram on 11/15/2019, which was unremarkable.  Definitely improved from previous.  She continues to rate pain as 8/10.  She denies any headache, chest pain, urinary symptoms, nausea, vomiting, or diarrhea.  Objective:  Vital signs in last 24 hours:  Vitals:   11/16/19 0402 11/16/19 0429 11/16/19 0825 11/16/19 0914  BP:  111/66  117/76  Pulse:  100  95  Resp: 20 17 18 19   Temp:  98.1 F (36.7 C)  98.2 F (36.8 C)  TempSrc:  Oral  Oral  SpO2: 98% 97% 100% 100%  Weight:      Height:        Intake/Output from previous day:   Intake/Output Summary (Last 24 hours) at 11/16/2019 1124 Last data filed at 11/15/2019 1400 Gross per 24 hour  Intake 40 ml  Output --  Net 40 ml    Physical Exam: General: Alert, awake, oriented x3, in no acute distress.  HEENT: [place]/AT PEERL, EOMI Neck: Trachea midline,  no masses, no thyromegal,y no JVD, no carotid bruit OROPHARYNX:  Moist, No exudate/ erythema/lesions.  Heart: Regular rate and rhythm, without murmurs, rubs, gallops, PMI non-displaced, no heaves or thrills on palpation.  Lungs: Clear to auscultation, no wheezing or rhonchi noted. No increased vocal fremitus resonant to percussion  Abdomen: Soft, nontender, nondistended, positive bowel sounds, no masses no hepatosplenomegaly noted..  Neuro: No focal neurological  deficits noted cranial nerves II through XII grossly intact. DTRs 2+ bilaterally upper and lower extremities. Strength 5 out of 5 in bilateral upper and lower extremities. Musculoskeletal: No warm swelling or erythema around joints, no spinal tenderness noted. Psychiatric: Patient alert and oriented x3, good insight and cognition, good recent to remote recall. Lymph node survey: No cervical axillary or inguinal lymphadenopathy noted.  Lab Results:  Basic Metabolic Panel:    Component Value Date/Time   NA 137 11/15/2019 0939   NA 142 09/11/2017 1431   K 3.5 11/15/2019 0939   CL 106 11/15/2019 0939   CO2 25 11/15/2019 0939   BUN 12 11/15/2019 0939   BUN 8 09/11/2017 1431   CREATININE 0.84 11/15/2019 0939   GLUCOSE 95 11/15/2019 0939   CALCIUM 8.4 (L) 11/15/2019 0939   CBC:    Component Value Date/Time   WBC 10.0 11/15/2019 0939   HGB 7.6 (L) 11/15/2019 0939   HGB 13.0 09/11/2017 1431   HCT 23.1 (L) 11/15/2019 0939   HCT 38.4 09/11/2017 1431   PLT 205 11/15/2019 0939   PLT 258 09/11/2017 1431   MCV 82.2 11/15/2019 0939   MCV 75 (L) 09/11/2017 1431   NEUTROABS 8.7 (H) 11/14/2019 0315   NEUTROABS 3.9 09/11/2017 1431   LYMPHSABS 1.5 11/14/2019 0315   LYMPHSABS 2.2 09/11/2017 1431   MONOABS 0.7 11/14/2019 0315   EOSABS 0.3 11/14/2019 0315   EOSABS 0.1 09/11/2017 1431   BASOSABS 0.0 11/14/2019 0315   BASOSABS 0.0 09/11/2017 1431    Recent Results (from the  past 240 hour(s))  Blood Culture (routine x 2)     Status: None   Collection Time: 11/07/19 11:50 AM   Specimen: BLOOD  Result Value Ref Range Status   Specimen Description   Final    BLOOD RIGHT ANTECUBITAL Performed at [facility], [address], [place], [place] [address]    Special Requests   Final    BOTTLES DRAWN AEROBIC AND ANAEROBIC Blood Culture adequate volume Performed at [facility], [address], [place], [place] [address]    Culture   Final    NO GROWTH 5 DAYS Performed  at [facility], [address] [place], [place] [address]    Report Status 11/12/2019 FINAL  Final  Blood Culture (routine x 2)     Status: None   Collection Time: 11/07/19  4:55 PM   Specimen: BLOOD RIGHT HAND  Result Value Ref Range Status   Specimen Description   Final    BLOOD RIGHT HAND Performed at [facility], [address] [place], [place] [address]    Special Requests   Final    BOTTLES DRAWN AEROBIC ONLY Blood Culture adequate volume Performed at [facility], [address], [place], [place] [address]    Culture   Final    NO GROWTH 5 DAYS Performed at [facility], [address] [place], [place] [address]    Report Status 11/12/2019 FINAL  Final  Culture, blood (single)     Status: None   Collection Time: 11/07/19  4:55 PM   Specimen: BLOOD LEFT HAND  Result Value Ref Range Status   Specimen Description   Final    BLOOD LEFT HAND Performed at [facility], [address] [place], [place] [address]    Special Requests   Final    BOTTLES DRAWN AEROBIC ONLY Blood Culture adequate volume Performed at [facility], [address], [place], [place] [address]    Culture   Final    NO GROWTH 5 DAYS Performed at [facility], [address], [place], [place] [address]    Report Status 11/12/2019 FINAL  Final  MRSA PCR Screening     Status: None   Collection Time: 11/09/19  4:09 PM   Specimen: Nasal Mucosa; Nasopharyngeal  Result Value Ref Range Status   MRSA by PCR NEGATIVE NEGATIVE Final    Comment:        The GeneXpert MRSA Assay (FDA approved for NASAL specimens only), is one component of a comprehensive MRSA colonization surveillance program. It is not intended to diagnose MRSA infection nor to guide or monitor treatment for MRSA infections. Performed at [facility], [address] [place], [place] [address]     Studies/Results: DG  Chest 2 View  Result Date: 11/16/2019 CLINICAL DATA:  Follow-up pneumonia. EXAM: CHEST - 2 VIEW COMPARISON:  11/11/2018 FINDINGS: Right arm PICC line tip is in the distal SVC. Stable cardiomediastinal contours. Unchanged appearance of bilateral pleural effusions. Bibasilar atelectasis and or airspace disease is also stable from previous exam. IMPRESSION: Persistent bilateral pleural effusions with bibasilar atelectasis and/or airspace disease. Electronically Signed   By: Signa Kell M.D.   On: 11/16/2019 10:32   ECHOCARDIOGRAM COMPLETE  Result Date: 11/15/2019    ECHOCARDIOGRAM REPORT   Patient Name:   [facility] Date of Exam: 11/15/2019 Medical Rec #:  [id number]     Height:  65.0 in Accession #:    [id number]    Weight:       280.0 lb Date of Birth:  [birth year]    BSA:          2.282 m Patient Age:    38 years      BP:           119/61 mmHg Patient Gender: F             HR:           98 bpm. Exam Location:  Inpatient Procedure: 2D Echo Indications:    Congestive Heart Failure 428.0 / I50.9  History:        Patient has no prior history of Echocardiogram examinations.                 COVID-19 10/20/19, Sickle cell anemia with crisis, Acute chest                 syndrome, Chronic pain syndrome, Chronic pain syndrome,                 Leukocytosis.  Sonographer:    Leta Jungling RDCS Referring Phys: [id number] OLUGBEMIGA E JEGEDE  Sonographer Comments: Echo performed with patient laying on her right side IMPRESSIONS  1. Left ventricular ejection fraction, by estimation, is 60 to 65%. The left ventricle has normal function. The left ventricle has no regional wall motion abnormalities. Left ventricular diastolic parameters were normal.  2. Right ventricular systolic function is normal. The right ventricular size is normal.  3. The mitral valve is normal in structure. No evidence of mitral valve regurgitation. No evidence of mitral stenosis.  4. The aortic valve is normal in structure. Aortic valve  regurgitation is not visualized. No aortic stenosis is present.  5. The inferior vena cava is normal in size with greater than 50% respiratory variability, suggesting right atrial pressure of 3 mmHg. FINDINGS  Left Ventricle: Left ventricular ejection fraction, by estimation, is 60 to 65%. The left ventricle has normal function. The left ventricle has no regional wall motion abnormalities. The left ventricular internal cavity size was normal in size. There is  no left ventricular hypertrophy. Left ventricular diastolic parameters were normal. Right Ventricle: The right ventricular size is normal. No increase in right ventricular wall thickness. Right ventricular systolic function is normal. Left Atrium: Left atrial size was normal in size. Right Atrium: Right atrial size was normal in size. Pericardium: There is no evidence of pericardial effusion. Mitral Valve: The mitral valve is normal in structure. No evidence of mitral valve regurgitation. No evidence of mitral valve stenosis. Tricuspid Valve: The tricuspid valve is normal in structure. Tricuspid valve regurgitation is not demonstrated. No evidence of tricuspid stenosis. Aortic Valve: The aortic valve is normal in structure. Aortic valve regurgitation is not visualized. No aortic stenosis is present. Pulmonic Valve: The pulmonic valve was normal in structure. Pulmonic valve regurgitation is not visualized. No evidence of pulmonic stenosis. Aorta: The aortic root is normal in size and structure. Venous: The inferior vena cava is normal in size with greater than 50% respiratory variability, suggesting right atrial pressure of 3 mmHg. IAS/Shunts: No atrial level shunt detected by color flow Doppler.  LEFT VENTRICLE PLAX 2D LVIDd:         4.86 cm  Diastology LVIDs:         3.25 cm  LV e' medial:    6.85 cm/s LV PW:  1.19 cm  LV E/e' medial:  14.2 LV IVS:        1.13 cm  LV e' lateral:   8.05 cm/s LVOT diam:     2.40 cm  LV E/e' lateral: 12.1 LV SV:          76 LV SV Index:   33 LVOT Area:     4.52 cm  RIGHT VENTRICLE TAPSE (M-mode): 1.3 cm LEFT ATRIUM             Index LA diam:        3.50 cm 1.53 cm/m LA Vol (A2C):   38.2 ml 16.74 ml/m LA Vol (A4C):   41.4 ml 18.14 ml/m LA Biplane Vol: 42.7 ml 18.71 ml/m  AORTIC VALVE LVOT Vmax:   113.00 cm/s LVOT Vmean:  68.600 cm/s LVOT VTI:    0.168 m  AORTA Ao Root diam: 3.40 cm MITRAL VALVE MV Area (PHT): 3.83 cm    SHUNTS MV Decel Time: 198 msec    Systemic VTI:  0.17 m MV E velocity: 97.40 cm/s  Systemic Diam: 2.40 cm MV A velocity: 91.30 cm/s MV E/A ratio:  1.07 Mihai Croitoru MD Electronically signed by Thurmon Fair MD Signature Date/Time: 11/15/2019/1:45:14 PM    Final     Medications: Scheduled Meds:  Chlorhexidine Gluconate Cloth  6 each Topical Daily   DULoxetine  20 mg Oral Daily   enoxaparin (LOVENOX) injection  40 mg Subcutaneous Q24H   folic acid  1 mg Oral Daily   HYDROmorphone   Intravenous Q4H   hydroxyurea  2,000 mg Oral Daily   metoprolol tartrate  12.5 mg Oral BID   oxyCODONE  20 mg Oral Q12H   senna-docusate  1 tablet Oral BID   valACYclovir  500 mg Oral BID   Vitamin D (Ergocalciferol)  50,000 Units Oral Q7 days   voxelotor  1,500 mg Oral Daily   Continuous Infusions:  diphenhydrAMINE     PRN Meds:.acetaminophen, albuterol, diphenhydrAMINE **OR** diphenhydrAMINE, naloxone **AND** sodium chloride flush, ondansetron (ZOFRAN) IV, oxyCODONE, polyethylene glycol, sodium chloride flush, tiZANidine  Consultants:  None  Procedures:  None  Antibiotics:  Antibiotics completed  Assessment/Plan: Principal Problem:   Sickle cell pain crisis (HCC) Active Problems:   Sickle cell crisis (HCC)   Leukocytosis   Tachycardia with heart rate 100-120 beats per minute   Chronic pain syndrome   Hypokalemia   Anxiety   HCAP (healthcare-associated pneumonia)   Sepsis (HCC)   Acute kidney injury (HCC)   History of COVID-19   Poor venous access   Essential  hypertension   Hypoxia   Pulmonary edema  HCAP vs Acute chest syndrome:  Improving.  Chest x-ray improved from previous, shows persistent bilateral pleural effusions with bibasilar atelectasis and/or airspace disease.  Also, echocardiogram on 11/15/2019 unremarkable. Continue supportive care No further antibiotic therapy warranted at this time. Continue DuoNeb every 6 hours as needed Oxygen saturation 100% on 2 L.  Ambulating pulse ox today.  Sickle cell disease with pain crisis: Weaning IV Dilaudid PCA Home oxycodone 20 mg every 6 hours as needed for severe breakthrough pain Monitor vital signs closely, reevaluate pain scale regularly, and supplemental oxygen as needed  Chronic pain syndrome: Continue home medications  Acute kidney injury: Resolved.  Continue to avoid nephrotoxins.  Hypertension: Controlled.  Continue metoprolol 12.5 mg every 12 hours.  Leukocytosis: Resolved.  Stable.  Continue to monitor closely.  Repeat CBC in a.m.  Transaminitis: Resolving.  Continue to hydrate and monitor closely.  Hepatic function panel in a.m.  Hypokalemia: Resolved.  Potassium repleted.  Continue to follow closely.    Code Status: Full Code Family Communication: N/A Disposition Plan: Not yet ready for discharge  Cris Gibby Rennis Petty  APRN, MSN, FNP-C [facility] [address] [place], [place] [address] [phone]  If 5PM-8AM, please contact night-coverage.  11/16/2019, 11:24 AM  LOS: 9 days

## 2019-11-16 NOTE — Plan of Care (Signed)
  Problem: Education: Goal: Knowledge of General Education information will improve Description: Including pain rating scale, medication(s)/side effects and non-pharmacologic comfort measures Outcome: Progressing   Problem: Health Behavior/Discharge Planning: Goal: Ability to manage health-related needs will improve Outcome: Progressing   Problem: Health Behavior/Discharge Planning: Goal: Ability to manage health-related needs will improve Outcome: Progressing   Problem: Clinical Measurements: Goal: Ability to maintain clinical measurements within normal limits will improve Outcome: Progressing   Problem: Clinical Measurements: Goal: Will remain free from infection Outcome: Progressing

## 2019-11-16 NOTE — BH Specialist Note (Signed)
[facility] General Follow Up Note  11/16/2019 Name: Stephanie Burch MRN: [id number] DOB: Apr 05, 1980 Stephanie Burch is a 39 y.o. year old female who sees Azzie Glatter, FNP for primary care. LCSW was initially consulted by patient for support with recently increased anxiety and fear of illness.  Interpreter: No.   Interpreter Name & Language: none  Assessment: Patient experiencing increased anxiety exacerbated by illness.  Ongoing Intervention: Today CSW met briefly with patient at bedside in [facility] again. Brief supportive counseling and check in. Patient relieved to have discussed her feelings yesterday. CSW and patient made plan to check in next week, whether patient still admitted or discharged back home, as CSW will be out of office 10/14 and 10/15. Will plan to follow patient outpatient for support with recently elevated anxiety.  Review of patient status, including review of consultants reports, relevant laboratory and other test results, and collaboration with appropriate care team members and the patient's provider was performed as part of comprehensive patient evaluation and provision of services.     Follow up Plan: 1. Follow up week of 11/21/19, virtually if patient discharged by then.  Estanislado Emms, [facility] [phone]

## 2019-11-17 DIAGNOSIS — G894 Chronic pain syndrome: Secondary | ICD-10-CM | POA: Diagnosis not present

## 2019-11-17 DIAGNOSIS — F419 Anxiety disorder, unspecified: Secondary | ICD-10-CM | POA: Diagnosis not present

## 2019-11-17 DIAGNOSIS — N179 Acute kidney failure, unspecified: Secondary | ICD-10-CM | POA: Diagnosis not present

## 2019-11-17 DIAGNOSIS — R0902 Hypoxemia: Secondary | ICD-10-CM | POA: Diagnosis not present

## 2019-11-17 DIAGNOSIS — D5700 Hb-SS disease with crisis, unspecified: Secondary | ICD-10-CM | POA: Diagnosis not present

## 2019-11-17 MED ORDER — HYDROMORPHONE 1 MG/ML IV SOLN
INTRAVENOUS | Status: DC
  Administered 2019-11-17: 4.5 mg via INTRAVENOUS
  Administered 2019-11-17: 2.5 mg via INTRAVENOUS
  Administered 2019-11-17: 6 mg via INTRAVENOUS
  Administered 2019-11-17: 5 mg via INTRAVENOUS
  Administered 2019-11-17: 1.5 mg via INTRAVENOUS
  Administered 2019-11-18: 3 mg via INTRAVENOUS
  Administered 2019-11-18: 4.5 mg via INTRAVENOUS
  Administered 2019-11-18: 5 mg via INTRAVENOUS
  Filled 2019-11-17: qty 30

## 2019-11-17 NOTE — Plan of Care (Signed)

## 2019-11-17 NOTE — Progress Notes (Addendum)
Subjective: Stephanie Burch is a 39 year old female with a medical history significant for sickle cell disease, chronic pain syndrome, opiate dependence and tolerance, morbid obesity, history of depression and anxiety, and history of anemia of chronic disease was admitted for pneumonia in the setting of sickle cell pain crisis.  Patient says that pain has improved some overnight.  Patient is out of bed and in chair.  Oxygen saturation is 100% on 2 L.  Ambulating pulse ox shows desaturation below 90% when ambulating on room air.,  Improved to 98% on 2 L.  Pain intensity is 6/10 primarily to low back and lower extremities.  Patient denies headache, dizziness, chest pain, urinary symptoms, nausea, vomiting, or diarrhea.  Objective:  Vital signs in last 24 hours:  Vitals:   11/17/19 0637 11/17/19 0645 11/17/19 1031 11/17/19 1334  BP: 117/68  100/68 113/85  Pulse: (!) 107  96 96  Resp: 20 20 14 12   Temp: 98.5 F (36.9 C)  (!) 97.1 F (36.2 C) 97.8 F (36.6 C)  TempSrc:      SpO2: 100% 100% 100% 100%  Weight:      Height:        Intake/Output from previous day:   Intake/Output Summary (Last 24 hours) at 11/17/2019 1654 Last data filed at 11/17/2019 0755 Gross per 24 hour  Intake 310 ml  Output --  Net 310 ml    Physical Exam: General: Alert, awake, oriented x3, in no acute distress.  HEENT: [place]/AT PEERL, EOMI Neck: Trachea midline,  no masses, no thyromegal,y no JVD, no carotid bruit OROPHARYNX:  Moist, No exudate/ erythema/lesions.  Heart: Regular rate and rhythm, without murmurs, rubs, gallops, PMI non-displaced, no heaves or thrills on palpation.  Lungs: Clear to auscultation, no wheezing or rhonchi noted. No increased vocal fremitus resonant to percussion  Abdomen: Soft, nontender, nondistended, positive bowel sounds, no masses no hepatosplenomegaly noted..  Neuro: No focal neurological deficits noted cranial nerves II through XII grossly intact. DTRs 2+ bilaterally upper and  lower extremities. Strength 5 out of 5 in bilateral upper and lower extremities. Musculoskeletal: No warm swelling or erythema around joints, no spinal tenderness noted. Psychiatric: Patient alert and oriented x3, good insight and cognition, good recent to remote recall. Lymph node survey: No cervical axillary or inguinal lymphadenopathy noted.  Lab Results:  Basic Metabolic Panel:    Component Value Date/Time   NA 137 11/16/2019 1152   NA 142 09/11/2017 1431   K 3.3 (L) 11/16/2019 1152   CL 106 11/16/2019 1152   CO2 23 11/16/2019 1152   BUN 9 11/16/2019 1152   BUN 8 09/11/2017 1431   CREATININE 0.76 11/16/2019 1152   GLUCOSE 96 11/16/2019 1152   CALCIUM 8.2 (L) 11/16/2019 1152   CBC:    Component Value Date/Time   WBC 9.2 11/16/2019 1152   HGB 7.6 (L) 11/16/2019 1152   HGB 13.0 09/11/2017 1431   HCT 23.0 (L) 11/16/2019 1152   HCT 38.4 09/11/2017 1431   PLT 196 11/16/2019 1152   PLT 258 09/11/2017 1431   MCV 83.0 11/16/2019 1152   MCV 75 (L) 09/11/2017 1431   NEUTROABS 8.7 (H) 11/14/2019 0315   NEUTROABS 3.9 09/11/2017 1431   LYMPHSABS 1.5 11/14/2019 0315   LYMPHSABS 2.2 09/11/2017 1431   MONOABS 0.7 11/14/2019 0315   EOSABS 0.3 11/14/2019 0315   EOSABS 0.1 09/11/2017 1431   BASOSABS 0.0 11/14/2019 0315   BASOSABS 0.0 09/11/2017 1431    Recent Results (from the past  240 hour(s))  Blood Culture (routine x 2)     Status: None   Collection Time: 11/07/19  4:55 PM   Specimen: BLOOD RIGHT HAND  Result Value Ref Range Status   Specimen Description   Final    BLOOD RIGHT HAND Performed at [facility], [address], [place], [place] [address]    Special Requests   Final    BOTTLES DRAWN AEROBIC ONLY Blood Culture adequate volume Performed at [facility], [address] [place], [place] [address]    Culture   Final    NO GROWTH 5 DAYS Performed at [facility], [address] [place], [place] [address]    Report  Status 11/12/2019 FINAL  Final  Culture, blood (single)     Status: None   Collection Time: 11/07/19  4:55 PM   Specimen: BLOOD LEFT HAND  Result Value Ref Range Status   Specimen Description   Final    BLOOD LEFT HAND Performed at [facility], [address] [place], [place] [address]    Special Requests   Final    BOTTLES DRAWN AEROBIC ONLY Blood Culture adequate volume Performed at [facility], [address], [place], [place] [address]    Culture   Final    NO GROWTH 5 DAYS Performed at [facility], [address], [place], [place] [address]    Report Status 11/12/2019 FINAL  Final  MRSA PCR Screening     Status: None   Collection Time: 11/09/19  4:09 PM   Specimen: Nasal Mucosa; Nasopharyngeal  Result Value Ref Range Status   MRSA by PCR NEGATIVE NEGATIVE Final    Comment:        The GeneXpert MRSA Assay (FDA approved for NASAL specimens only), is one component of a comprehensive MRSA colonization surveillance program. It is not intended to diagnose MRSA infection nor to guide or monitor treatment for MRSA infections. Performed at [facility], [address] [place], [place] [address]     Studies/Results: DG Chest 2 View  Result Date: 11/16/2019 CLINICAL DATA:  Follow-up pneumonia. EXAM: CHEST - 2 VIEW COMPARISON:  11/11/2018 FINDINGS: Right arm PICC line tip is in the distal SVC. Stable cardiomediastinal contours. Unchanged appearance of bilateral pleural effusions. Bibasilar atelectasis and or airspace disease is also stable from previous exam. IMPRESSION: Persistent bilateral pleural effusions with bibasilar atelectasis and/or airspace disease. Electronically Signed   By: Signa Kell M.D.   On: 11/16/2019 10:32    Medications: Scheduled Meds: . Chlorhexidine Gluconate Cloth  6 each Topical Daily  . DULoxetine  20 mg Oral Daily  . enoxaparin (LOVENOX) injection  40 mg Subcutaneous Q24H  .  folic acid  1 mg Oral Daily  . HYDROmorphone   Intravenous Q4H  . hydroxyurea  2,000 mg Oral Daily  . metoprolol tartrate  12.5 mg Oral BID  . oxyCODONE  20 mg Oral Q12H  . senna-docusate  1 tablet Oral BID  . valACYclovir  500 mg Oral BID  . Vitamin D (Ergocalciferol)  50,000 Units Oral Q7 days  . voxelotor  1,500 mg Oral Daily   Continuous Infusions: . diphenhydrAMINE     PRN Meds:.acetaminophen, albuterol, diphenhydrAMINE **OR** diphenhydrAMINE, naloxone **AND** sodium chloride flush, ondansetron (ZOFRAN) IV, oxyCODONE, polyethylene glycol, sodium chloride flush, tiZANidine  Consultants:  None  Procedures:  None  Antibiotics:  None  Assessment/Plan: Principal Problem:   Sickle cell pain crisis (HCC) Active  Problems:   Sickle cell crisis (HCC)   Leukocytosis   Tachycardia with heart rate 100-120 beats per minute   Chronic pain syndrome   Hypokalemia   Anxiety   HCAP (healthcare-associated pneumonia)   Sepsis (HCC)   Acute kidney injury (HCC)   History of COVID-19   Poor venous access   Essential hypertension   Hypoxia   Pulmonary edema  HCAP vs Acute Chest syndrome:  Improved.  Most recent chest x-ray was markedly improved from previous.  Also, echocardiogram on 11/15/2019 was unremarkable.  Saturation below 90% when ambulating on room air.  Patient will likely discharge home with 2 L oxygen per home health No further antibiotic therapy warranted at this time Continue supportive care  Sickle cell disease with pain crisis: Discontinued IV Dilaudid PCA.  Continue home oxycodone 20 mg every 6 hours as needed  Dilaudid 2 mg every 4 hours as needed for severe breakthrough pain Monitor vital signs closely, reevaluate pain scale regularly, and supplemental oxygen as needed.  Chronic pain syndrome: Continue home medications  Acute kidney injury: Resolved.  Continue to avoid nephrotoxins.  Hypertension: Control.  Continue home  medications  Leukocytosis: Resolved.  Stable.  Patient is afebrile.  Follow CBC in a.m.  Transaminitis: Resolving.  Continue to hydrate and monitor closely.  Hypokalemia: Resolved.  Potassium repleted.  Continue to follow closely.     Code Status: Full Code Family Communication: N/A Disposition Plan: Not yet ready for discharge.  Discharge plan for 11/18/2019  Nolon Nations  APRN, MSN, FNP-C Patient Care [facility] [address] [place], [place] [address] [phone]  If 5PM-8AM, please contact night-coverage.  11/17/2019, 4:54 PM  LOS: 10 days

## 2019-11-17 NOTE — Plan of Care (Signed)
  Problem: Education: Goal: Knowledge of General Education information will improve Description Including pain rating scale, medication(s)/side effects and non-pharmacologic comfort measures Outcome: Progressing   Problem: Health Behavior/Discharge Planning: Goal: Ability to manage health-related needs will improve Outcome: Progressing   

## 2019-11-17 NOTE — Progress Notes (Signed)
SATURATION QUALIFICATIONS: (This note is used to comply with regulatory documentation for home oxygen)  Patient Saturations on Room Air at Rest 93%  Patient Saturations on Room Air while Ambulating 89%  Patient Saturations on 2 Liters of oxygen while Ambulating 98%  Please briefly explain why patient needs home oxygen:  Pt walked about 200 feet around unit. Stopped briefly to recover and return to room. Dyspnea with exertion.   SRP, RN

## 2019-11-18 ENCOUNTER — Telehealth: Payer: Self-pay | Admitting: Family Medicine

## 2019-11-18 ENCOUNTER — Other Ambulatory Visit: Payer: Self-pay | Admitting: Family Medicine

## 2019-11-18 DIAGNOSIS — F1190 Opioid use, unspecified, uncomplicated: Secondary | ICD-10-CM

## 2019-11-18 DIAGNOSIS — N179 Acute kidney failure, unspecified: Secondary | ICD-10-CM | POA: Diagnosis not present

## 2019-11-18 DIAGNOSIS — G894 Chronic pain syndrome: Secondary | ICD-10-CM

## 2019-11-18 DIAGNOSIS — F419 Anxiety disorder, unspecified: Secondary | ICD-10-CM | POA: Diagnosis not present

## 2019-11-18 DIAGNOSIS — D5700 Hb-SS disease with crisis, unspecified: Secondary | ICD-10-CM | POA: Diagnosis not present

## 2019-11-18 DIAGNOSIS — R0902 Hypoxemia: Secondary | ICD-10-CM | POA: Diagnosis not present

## 2019-11-18 DIAGNOSIS — J189 Pneumonia, unspecified organism: Secondary | ICD-10-CM | POA: Diagnosis not present

## 2019-11-18 DIAGNOSIS — D571 Sickle-cell disease without crisis: Secondary | ICD-10-CM

## 2019-11-18 LAB — CBC
HCT: 24 % — ABNORMAL LOW (ref 36.0–46.0)
Hemoglobin: 8 g/dL — ABNORMAL LOW (ref 12.0–15.0)
MCH: 28.2 pg (ref 26.0–34.0)
MCHC: 33.3 g/dL (ref 30.0–36.0)
MCV: 84.5 fL (ref 80.0–100.0)
Platelets: 195 10*3/uL (ref 150–400)
RBC: 2.84 MIL/uL — ABNORMAL LOW (ref 3.87–5.11)
RDW: 22.5 % — ABNORMAL HIGH (ref 11.5–15.5)
WBC: 8.8 10*3/uL (ref 4.0–10.5)
nRBC: 1.6 % — ABNORMAL HIGH (ref 0.0–0.2)

## 2019-11-18 MED ORDER — XTAMPZA ER 18 MG PO C12A: 18.0000 mg | EXTENDED_RELEASE_CAPSULE | Freq: Two times a day (BID) | ORAL | 0 refills | Status: DC

## 2019-11-18 NOTE — Discharge Summary (Addendum)
Physician Discharge Summary  Stephanie Burch ONG:[id number] DOB: January 08, 1981 DOA: 11/07/2019  PCP: Kallie LocksStroud, [name], FNP  Admit date: 11/07/2019  Discharge date: 11/18/2019  Discharge Diagnoses:  Principal Problem:   Sickle cell pain crisis (HCC) Active Problems:   Sickle cell crisis (HCC)   Leukocytosis   Tachycardia with heart rate 100-120 beats per minute   Chronic pain syndrome   Hypokalemia   Anxiety   HCAP (healthcare-associated pneumonia)   Sepsis (HCC)   Acute kidney injury (HCC)   History of COVID-19   Poor venous access   Essential hypertension   Hypoxia   Pulmonary edema   Discharge Condition: Stable  Disposition:  Pt is discharged home in good condition and is to follow up with Kallie LocksStroud, [name], FNP in 1 week to have labs evaluated. Stephanie Burch is instructed to increase activity slowly and balance with rest for the next few days, and use prescribed medication to complete treatment of pain  Diet: Regular Wt Readings from Last 3 Encounters:  11/18/19 72.5 kg  10/18/19 (!) 137.7 kg  08/10/19 132.9 kg    History of present illness:  Stephanie Burch  is a 39 y.o. female with a medical history signifcant for sickle cell disease type [place], chronic pain syndrome, opiate dependence and tolerance, history of anemia of chronic disease, and depression and anxiety presented to ER with complaints of sickle cell pain, chest pain, and shortness of breath. Patient say that pain has been increased to low back and lower extremities over the past several days that is consistent with her typical pain crisis. However,  patient has been having increased chest pain and shortness of breath over the past 24 hours that were worsened with exertion. Patient was diagnosed with COVID 19 infection on 10/20/2019 and was scheduled to received monoclonal antibody this am. She says that shortness of breath increased while at the clinic. Patient says that pain to low back and lower extremities is constant  and sharp. She rates pain as 10/10.  She endorses fatigue, headache, and shortness of breath.  She also endorses some chest pain.  She denies blurred vision, dizziness, urinary symptoms, nausea, vomiting, or diarrhea. ER Course:  Vital signs show BP 121/76   Pulse (!) 128   Temp 99.6 F (37.6 C) (Oral)   Resp (!) 29   Ht 5\' 5"  (1.651 m)   Wt 127 kg   SpO2 99%   BMI 46.59 kg/m . Lactic acid elevated at 2.5. Code sepsis, patient received Aztreonam x1, while in ER and vancomycin and cefepime per pharmacy consult. Chest xray shows new airspace opacities in the lateral right lung base, compatible with pneumonia and mild cardiomegaly.  WBCs markedly elevated at 30.5.  Hemoglobin is 9.7, consistent with patient's baseline.  Absolute reticulocytes 335.  Sodium 146, potassium 5.7, chloride 113.  Creatinine 1.08.  AST 78, ALT 92, and total bilirubin 3.4.    Patient admitted to telemetry for management of hospital-acquired pneumonia in the setting of sickle cell pain crisis.   Hospital Course:  HCAP vs. Acute Chest syndrome;  Patient was previously diagnosed with COVID-19 infection on 10/20/2019.  Since that time shortness of breath, fatigue, and chest pain progressively worsened.  On admission, chest xray showed new airspace opacities in the lateral right lobe.  Empiric antibiotics were initiated and continued for 7 days.  Chest x-ray on 11/10/2019 show persistent bilateral pleural effusions with bibasilar atelectasis and/or airspace disease, which was improved from previous.   Hypoxia improved.  Oxygen saturation  95% on RA.  No decrease below 90% with ambulation.  Patient does not warrant home oxygen.  Advised to continue incentive spirometer.  Albuterol inhaler as needed.  Patient to follow-up with PCP in 1 week.  Also, recommend repeat chest x-ray in 4 weeks.  Sickle cell disease with pain crisis:  Patient was admitted for sickle cell pain crisis and managed appropriately with IVF, IV Dilaudid via PCA  , as well as other adjunct therapies per sickle cell pain management protocols.  Toradol was held due to acute kidney injury.  OxyContin was continued throughout admission without interruption.  Patient was transitioned to oral medications.  IV Dilaudid PCA was weaned appropriately.  Pain intensity decreased to 6/10.  Patient advised to resume home medications.  She is to follow-up with her PCP for medication management.  Acute kidney injury:  On admission, creatinine was mildly elevated.  Suspected to be related to dehydration.  Resolved throughout admission with avoidance of nephrotoxins and gentle hydration.  Creatinine 0.76 at discharge.  Patient advised to follow-up with PCP to repeat BMP in 1 week.  Also, hydrate with 64 ounces of fluids daily.  Morbid obesity: Patient advised to follow a low-fat, low carbohydrate diet divided over small meals throughout the day.  Also, add exercise as tolerated.  Generalized anxiety disorder: Home medications continued without interruption.  No medication changes warranted.  Patient denied any suicidal or homicidal ideations.  Patient followed by Abigail Butts, LCSW and behavioral specialist throughout admission.  We will follow-up as an outpatient  Patient is afebrile, oxygen saturation is 95% on room air, and RRR 16.  Patient is alert, oriented, and ambulating without assistance.  She is aware of all upcoming appointments.  All home medications are to be resumed.  Patient was therefore discharged home today in a hemodynamically stable condition.   Stephanie Burch will follow-up with PCP within 1 week of this discharge. Stephanie Burch was counseled extensively about nonpharmacologic means of pain management, patient verbalized understanding and was appreciative of  the care received during this admission.   We discussed the need for good hydration, monitoring of hydration status, avoidance of heat, cold, stress, and infection triggers. We discussed the need to be adherent  with taking Hydrea and other home medications. Patient was reminded of the need to seek medical attention immediately if any symptom of bleeding, anemia, or infection occurs.  Discharge Exam: Vitals:   11/18/19 1138 11/18/19 1255  BP: 113/66 (!) 104/58  Pulse: 96 (!) 104  Resp: 16 15  Temp: 98.5 F (36.9 C) 99.2 F (37.3 C)  SpO2: 100% 100%   Vitals:   11/18/19 1006 11/18/19 1049 11/18/19 1138 11/18/19 1255  BP: (!) 99/52  113/66 (!) 104/58  Pulse: 88  96 (!) 104  Resp: 20 (!) Temp: 98.6 F (37 C)  98.5 F (36.9 C) 99.2 F (37.3 C)  TempSrc: Oral  Oral Oral  SpO2: 93% 91% 100% 100%  Weight:      Height:        General appearance : Awake, alert, not in any distress. Speech Clear. Not toxic looking HEENT: Atraumatic and Normocephalic, pupils equally reactive to light and accomodation Neck: Supple, no JVD. No cervical lymphadenopathy.  Chest: Good air entry bilaterally, no added sounds  CVS: S1 S2 regular, no murmurs.  Abdomen: Bowel sounds present, Non tender and not distended with no gaurding, rigidity or rebound. Extremities: B/L Lower Ext shows no edema, both legs are warm to touch Neurology: Awake  alert, and oriented X 3, CN II-XII intact, Non focal Skin: No Rash  Discharge Instructions  Discharge Instructions    Discharge patient   Complete by: As directed    Discharge disposition: 01-Home or Self Care   Discharge patient date: 11/18/2019     Allergies as of 11/18/2019   No Known Allergies     Medication List    TAKE these medications   DULoxetine 20 MG capsule Commonly known as: CYMBALTA Take 20 mg by mouth daily.   folic acid 1 MG tablet Commonly known as: FOLVITE Take 1 tablet (1 mg total) by mouth daily.   hydroxyurea 500 MG capsule Commonly known as: HYDREA Take 2,000 mg by mouth daily. May take with food to minimize GI side effects.   ibuprofen 800 MG tablet Commonly known as: ADVIL Take 1 tablet (800 mg total) by mouth every 8  (eight) hours as needed for headache, mild pain, moderate pain or cramping.   naloxone 0.4 MG/ML injection Commonly known as: NARCAN As needed What changed:   how much to take  how to take this  when to take this  reasons to take this   Oxbryta 500 MG Tabs tablet Generic drug: voxelotor Take 1,500 mg by mouth daily. For Sickle Cell Anemia   Oxycodone HCl 20 MG Tabs Take 1 tablet (20 mg total) by mouth every 4 (four) hours as needed (for pain).   tiZANidine 4 MG capsule Commonly known as: ZANAFLEX Take 1 capsule (4 mg total) by mouth 3 (three) times daily as needed for muscle spasms.   valACYclovir 500 MG tablet Commonly known as: Valtrex Take Valtrex 500 mg daily for suppression of outbreaks. Take Valtrex 1000 mg daily X 1 week for outbreaks.   Vitamin D (Ergocalciferol) 1.25 MG (50000 UNIT) Caps capsule Commonly known as: DRISDOL Take 1 capsule (50,000 Units total) by mouth every 7 (seven) days.   Xtampza ER 18 MG C12a Generic drug: oxyCODONE ER Take 18 mg by mouth every 12 (twelve) hours.       The results of significant diagnostics from this hospitalization (including imaging, microbiology, ancillary and laboratory) are listed below for reference.    Significant Diagnostic Studies: DG Chest 1 View  Result Date: 11/11/2019 CLINICAL DATA:  Shortness of breath and chest pain. History of sickle cell disease EXAM: CHEST  1 VIEW COMPARISON:  November 07, 2019 FINDINGS: Central catheter tip is in the superior vena cava. No pneumothorax. There are pleural effusions bilaterally with patchy airspace opacity in the lower lung regions bilaterally. There is cardiomegaly with mild pulmonary venous hypertension. No adenopathy is demonstrable. No appreciable bone lesions. IMPRESSION: Central catheter tip in superior vena cava. No pneumothorax. Cardiomegaly with a degree of pulmonary vascular congestion. Pleural effusions bilaterally. Question a degree of underlying congestive heart  failure. There is bibasilar airspace opacity which likely is due primarily to atelectasis, although a degree of pulmonary edema and/or pneumonia in the lung bases cannot be excluded. There is slightly more consolidation in the left base than right base; the appearance of consolidation raises concern for pneumonia in this area on the left. Electronically Signed   By: Bretta Bang III M.D.   On: 11/11/2019 08:46   DG Chest 2 View  Result Date: 11/16/2019 CLINICAL DATA:  Follow-up pneumonia. EXAM: CHEST - 2 VIEW COMPARISON:  11/11/2018 FINDINGS: Right arm PICC line tip is in the distal SVC. Stable cardiomediastinal contours. Unchanged appearance of bilateral pleural effusions. Bibasilar atelectasis and or airspace disease is also  stable from previous exam. IMPRESSION: Persistent bilateral pleural effusions with bibasilar atelectasis and/or airspace disease. Electronically Signed   By: Signa Kell M.D.   On: 11/16/2019 10:32   DG Chest Port 1 View  Result Date: 11/07/2019 CLINICAL DATA:  Shortness of breath. EXAM: PORTABLE CHEST 1 VIEW COMPARISON:  April 18, 2019 FINDINGS: New airspace opacities in the lateral right lung base. Low lung volumes. No visible pleural effusions or pneumothorax. Mild enlargement of the cardiac silhouette. No acute osseous abnormality. IMPRESSION: 1. New airspace opacities in the lateral right lung base, compatible with pneumonia. 2. Mild cardiomegaly. Electronically Signed   By: Feliberto Harts MD   On: 11/07/2019 13:10   ECHOCARDIOGRAM COMPLETE  Result Date: 11/15/2019    ECHOCARDIOGRAM REPORT   Patient Name:   [facility] Date of Exam: 11/15/2019 Medical Rec #:  [id number]     Height:       65.0 in Accession #:    [id number]    Weight:       280.0 lb Date of Birth:  [birth year]    BSA:          2.282 m Patient Age:    38 years      BP:           119/61 mmHg Patient Gender: F             HR:           98 bpm. Exam Location:  Inpatient Procedure: 2D Echo Indications:     Congestive Heart Failure 428.0 / I50.9  History:        Patient has no prior history of Echocardiogram examinations.                 COVID-19 10/20/19, Sickle cell anemia with crisis, Acute chest                 syndrome, Chronic pain syndrome, Chronic pain syndrome,                 Leukocytosis.  Sonographer:    Leta Jungling RDCS Referring Phys: [id number] OLUGBEMIGA E JEGEDE  Sonographer Comments: Echo performed with patient laying on her right side IMPRESSIONS  1. Left ventricular ejection fraction, by estimation, is 60 to 65%. The left ventricle has normal function. The left ventricle has no regional wall motion abnormalities. Left ventricular diastolic parameters were normal.  2. Right ventricular systolic function is normal. The right ventricular size is normal.  3. The mitral valve is normal in structure. No evidence of mitral valve regurgitation. No evidence of mitral stenosis.  4. The aortic valve is normal in structure. Aortic valve regurgitation is not visualized. No aortic stenosis is present.  5. The inferior vena cava is normal in size with greater than 50% respiratory variability, suggesting right atrial pressure of 3 mmHg. FINDINGS  Left Ventricle: Left ventricular ejection fraction, by estimation, is 60 to 65%. The left ventricle has normal function. The left ventricle has no regional wall motion abnormalities. The left ventricular internal cavity size was normal in size. There is  no left ventricular hypertrophy. Left ventricular diastolic parameters were normal. Right Ventricle: The right ventricular size is normal. No increase in right ventricular wall thickness. Right ventricular systolic function is normal. Left Atrium: Left atrial size was normal in size. Right Atrium: Right atrial size was normal in size. Pericardium: There is no evidence of pericardial effusion. Mitral Valve: The mitral valve is normal in structure. No evidence of mitral valve  regurgitation. No evidence of mitral valve  stenosis. Tricuspid Valve: The tricuspid valve is normal in structure. Tricuspid valve regurgitation is not demonstrated. No evidence of tricuspid stenosis. Aortic Valve: The aortic valve is normal in structure. Aortic valve regurgitation is not visualized. No aortic stenosis is present. Pulmonic Valve: The pulmonic valve was normal in structure. Pulmonic valve regurgitation is not visualized. No evidence of pulmonic stenosis. Aorta: The aortic root is normal in size and structure. Venous: The inferior vena cava is normal in size with greater than 50% respiratory variability, suggesting right atrial pressure of 3 mmHg. IAS/Shunts: No atrial level shunt detected by color flow Doppler.  LEFT VENTRICLE PLAX 2D LVIDd:         4.86 cm  Diastology LVIDs:         3.25 cm  LV e' medial:    6.85 cm/s LV PW:         1.19 cm  LV E/e' medial:  14.2 LV IVS:        1.13 cm  LV e' lateral:   8.05 cm/s LVOT diam:     2.40 cm  LV E/e' lateral: 12.1 LV SV:         76 LV SV Index:   33 LVOT Area:     4.52 cm  RIGHT VENTRICLE TAPSE (M-mode): 1.3 cm LEFT ATRIUM             Index LA diam:        3.50 cm 1.53 cm/m LA Vol (A2C):   38.2 ml 16.74 ml/m LA Vol (A4C):   41.4 ml 18.14 ml/m LA Biplane Vol: 42.7 ml 18.71 ml/m  AORTIC VALVE LVOT Vmax:   113.00 cm/s LVOT Vmean:  68.600 cm/s LVOT VTI:    0.168 m  AORTA Ao Root diam: 3.40 cm MITRAL VALVE MV Area (PHT): 3.83 cm    SHUNTS MV Decel Time: 198 msec    Systemic VTI:  0.17 m MV E velocity: 97.40 cm/s  Systemic Diam: 2.40 cm MV A velocity: 91.30 cm/s MV E/A ratio:  1.07 Mihai Croitoru MD Electronically signed by Thurmon Fair MD Signature Date/Time: 11/15/2019/1:45:14 PM    Final    [place] EKG SITE RITE  Result Date: 11/09/2019 If Site Rite image not attached, placement could not be confirmed due to current cardiac rhythm.   Microbiology: Recent Results (from the past 240 hour(s))  MRSA PCR Screening     Status: None   Collection Time: 11/09/19  4:09 PM   Specimen: Nasal Mucosa;  Nasopharyngeal  Result Value Ref Range Status   MRSA by PCR NEGATIVE NEGATIVE Final    Comment:        The GeneXpert MRSA Assay (FDA approved for NASAL specimens only), is one component of a comprehensive MRSA colonization surveillance program. It is not intended to diagnose MRSA infection nor to guide or monitor treatment for MRSA infections. Performed at [facility], [address], [place], [place] [address]      Labs: Basic Metabolic Panel: Recent Labs  Lab 11/12/19 0415 11/12/19 0415 11/13/19 1323 11/13/19 1323 11/14/19 0315 11/14/19 0315 11/15/19 0939 11/16/19 1152  NA 138  --  138  --  140  --  137 137  K 2.5*   < > 3.2*   < > 3.2*   < > 3.5 3.3*  CL 107  --  108  --  108  --  106 106  CO2 22  --  24  --  22  --  25 23  GLUCOSE 160*  --  109*  --  132*  --  95 96  BUN 18  --  15  --  17  --  12 9  CREATININE 1.11*  --  0.96  --  0.86  --  0.84 0.76  CALCIUM 8.3*  --  8.3*  --  8.7*  --  8.4* 8.2*   < > = values in this interval not displayed.   Liver Function Tests: Recent Labs  Lab 11/12/19 0415 11/14/19 0315  AST 23 17  ALT 27 22  ALKPHOS 65 64  BILITOT 1.1 1.0  PROT 7.0 6.8  ALBUMIN 2.3* 2.2*   No results for input(s): LIPASE, AMYLASE in the last 168 hours. No results for input(s): AMMONIA in the last 168 hours. CBC: Recent Labs  Lab 11/12/19 0415 11/14/19 0315 11/15/19 0939 11/16/19 1152 11/18/19 1026  WBC 14.7* 11.3* 10.0 9.2 8.8  NEUTROABS 12.6* 8.7*  --   --   --   HGB 8.0* 7.9* 7.6* 7.6* 8.0*  HCT 24.5* 23.9* 23.1* 23.0* 24.0*  MCV 80.6 81.8 82.2 83.0 84.5  PLT 197 199 205 196 195   Cardiac Enzymes: No results for input(s): CKTOTAL, CKMB, CKMBINDEX, TROPONINI in the last 168 hours. BNP: Invalid input(s): POCBNP CBG: No results for input(s): GLUCAP in the last 168 hours.  Time coordinating discharge: 35 minutes  Signed:  Nolon Nations  APRN, MSN, FNP-C Patient Care [facility]  Group [address] [place], [place] [address] [phone]  [facility] 11/18/2019, 9:33 PM

## 2019-11-18 NOTE — Progress Notes (Signed)
SATURATION QUALIFICATIONS: (This note is used to comply with regulatory documentation for home oxygen)  Patient Saturations on Room Air at Rest = 100%  Patient Saturations on Room Air while Ambulating = 95%   Please briefly explain why patient needs home oxygen: Pt does not qualify for home O2.   Stephanie Burch

## 2019-11-18 NOTE — Progress Notes (Signed)
Discharge instructions given to and reviewed with pt. Pt verbalized understanding of all instructions including follow up appointment plan and medication list. Izabell Schalk, Yancey Flemings, RN

## 2019-11-21 ENCOUNTER — Telehealth: Payer: Self-pay

## 2019-11-21 ENCOUNTER — Other Ambulatory Visit: Payer: Self-pay | Admitting: Family Medicine

## 2019-11-21 ENCOUNTER — Telehealth: Payer: Self-pay | Admitting: Family Medicine

## 2019-11-21 DIAGNOSIS — F1190 Opioid use, unspecified, uncomplicated: Secondary | ICD-10-CM

## 2019-11-21 DIAGNOSIS — G894 Chronic pain syndrome: Secondary | ICD-10-CM

## 2019-11-21 DIAGNOSIS — D571 Sickle-cell disease without crisis: Secondary | ICD-10-CM

## 2019-11-21 MED ORDER — XTAMPZA ER 18 MG PO C12A: 18.0000 mg | EXTENDED_RELEASE_CAPSULE | Freq: Two times a day (BID) | ORAL | 0 refills | Status: DC

## 2019-11-21 NOTE — Telephone Encounter (Signed)
TCM call was not needed. Close encounter

## 2019-11-21 NOTE — Telephone Encounter (Signed)
Sent to Provider 

## 2019-11-22 ENCOUNTER — Other Ambulatory Visit: Payer: Self-pay | Admitting: Family Medicine

## 2019-11-22 ENCOUNTER — Telehealth: Payer: Self-pay | Admitting: Family Medicine

## 2019-11-22 DIAGNOSIS — F1190 Opioid use, unspecified, uncomplicated: Secondary | ICD-10-CM

## 2019-11-22 DIAGNOSIS — G894 Chronic pain syndrome: Secondary | ICD-10-CM

## 2019-11-22 DIAGNOSIS — D571 Sickle-cell disease without crisis: Secondary | ICD-10-CM

## 2019-11-22 MED ORDER — XTAMPZA ER 18 MG PO C12A: 18.0000 mg | EXTENDED_RELEASE_CAPSULE | Freq: Two times a day (BID) | ORAL | 0 refills | Status: DC

## 2019-11-22 NOTE — Telephone Encounter (Signed)
Stephanie Burch

## 2019-11-23 ENCOUNTER — Telehealth: Payer: Self-pay | Admitting: *Deleted

## 2019-11-23 NOTE — Telephone Encounter (Signed)
Called and spoke with patient she states she will call her pcp to schedule a follow up . Chanler [place] PEC [phone]

## 2019-11-24 ENCOUNTER — Other Ambulatory Visit: Payer: Self-pay | Admitting: Family Medicine

## 2019-11-24 ENCOUNTER — Encounter: Payer: Self-pay | Admitting: Family Medicine

## 2019-11-24 DIAGNOSIS — G894 Chronic pain syndrome: Secondary | ICD-10-CM

## 2019-11-24 DIAGNOSIS — F1190 Opioid use, unspecified, uncomplicated: Secondary | ICD-10-CM

## 2019-11-24 DIAGNOSIS — D571 Sickle-cell disease without crisis: Secondary | ICD-10-CM

## 2019-11-25 ENCOUNTER — Other Ambulatory Visit: Payer: Self-pay | Admitting: *Deleted

## 2019-11-25 ENCOUNTER — Telehealth: Payer: Self-pay | Admitting: Clinical

## 2019-11-25 NOTE — Patient Outreach (Signed)
Care Coordination  11/25/2019  Stephanie Burch 05/26/1980 [id number]   An unsuccessful telephone outreach was attempted today. The patient was referred to the case management team for assistance with care management and care coordination.    RNCM made contact with patient today and a return call was requested for one hour later. RNCM attempted return call, no answer at that time.  Follow Up Plan: A HIPAA compliant phone message was left for the patient providing contact information and requesting a return call.  The Managed Medicaid care management team will reach out to the patient again over the next 7-14 days.   Estanislado Emms RN, BSN [place]  [facility]

## 2019-11-25 NOTE — Patient Instructions (Signed)
Visit Information  Ms. Stephanie Burch  - as a part of your Medicaid benefit, you are eligible for care management and care coordination services at no cost or copay. I was unable to reach you by phone today but would be happy to help you with your health related needs. Please feel free to call me @ [phone].   A member of the Managed Medicaid care management team will reach out to you again over the next 7-14 days.   Tania Steinhauser RN, BSN [place]  [facility] RN Care Coordinator   

## 2019-11-25 NOTE — Telephone Encounter (Signed)
[facility] General Follow Up Note  11/25/2019 Name: Stephanie Burch MRN: [id number] DOB: 1980-04-14 Stephanie Burch is a 39 y.o. year old female who sees Kallie Locks, FNP for primary care. LCSW was initially consulted to support with stress precipitating sickle cell crisis and illness anxiety.  Interpreter: No.   Interpreter Name & Language: none  Assessment: Patient experiencing stress and anxiety related to recent experience with COVID and then subsequent conditions that led to recent hospitalization.  Ongoing Intervention: Today CSW called patient to check in following hospitalization. Supportive counseling regarding recovery from recent illness. Patient reported significant anxiety leading up to recent hospitalization. While patient reports those specific fears have subsided, she now avoids certain things that remind her of when she was very ill. Discussion about avoidance reinforcing fears and about gradually reintroducing exposure to the things she is avoiding.   Review of patient status, including review of consultants reports, relevant laboratory and other test results, and collaboration with appropriate care team members and the patient's provider was performed as part of comprehensive patient evaluation and provision of services.     Follow up Plan: 1. Follow up at patient's next PCP appointment 12/16/19 2. CSW available from clinic as needed  Abigail Butts, LCSW Patient [facility] [phone]

## 2019-11-28 ENCOUNTER — Emergency Department (HOSPITAL_COMMUNITY): Payer: Medicaid Other

## 2019-11-28 ENCOUNTER — Other Ambulatory Visit: Payer: Self-pay | Admitting: Internal Medicine

## 2019-11-28 ENCOUNTER — Encounter (HOSPITAL_COMMUNITY): Payer: Self-pay

## 2019-11-28 ENCOUNTER — Emergency Department (HOSPITAL_COMMUNITY)
Admission: EM | Admit: 2019-11-28 | Discharge: 2019-11-28 | Disposition: A | Discharge status: Home / Self Care | Payer: Medicaid Other | Attending: Emergency Medicine | Admitting: Emergency Medicine

## 2019-11-28 ENCOUNTER — Other Ambulatory Visit: Payer: Self-pay

## 2019-11-28 DIAGNOSIS — M79662 Pain in left lower leg: Secondary | ICD-10-CM | POA: Diagnosis not present

## 2019-11-28 DIAGNOSIS — R9431 Abnormal electrocardiogram [ECG] [EKG]: Secondary | ICD-10-CM | POA: Diagnosis not present

## 2019-11-28 DIAGNOSIS — M545 Low back pain, unspecified: Secondary | ICD-10-CM | POA: Diagnosis not present

## 2019-11-28 DIAGNOSIS — D5700 Hb-SS disease with crisis, unspecified: Secondary | ICD-10-CM | POA: Diagnosis not present

## 2019-11-28 DIAGNOSIS — R079 Chest pain, unspecified: Secondary | ICD-10-CM | POA: Diagnosis not present

## 2019-11-28 DIAGNOSIS — F1190 Opioid use, unspecified, uncomplicated: Secondary | ICD-10-CM

## 2019-11-28 DIAGNOSIS — J90 Pleural effusion, not elsewhere classified: Secondary | ICD-10-CM | POA: Diagnosis not present

## 2019-11-28 DIAGNOSIS — I517 Cardiomegaly: Secondary | ICD-10-CM | POA: Diagnosis not present

## 2019-11-28 DIAGNOSIS — D571 Sickle-cell disease without crisis: Secondary | ICD-10-CM

## 2019-11-28 DIAGNOSIS — J9811 Atelectasis: Secondary | ICD-10-CM | POA: Diagnosis not present

## 2019-11-28 DIAGNOSIS — R911 Solitary pulmonary nodule: Secondary | ICD-10-CM | POA: Diagnosis not present

## 2019-11-28 DIAGNOSIS — I10 Essential (primary) hypertension: Secondary | ICD-10-CM | POA: Insufficient documentation

## 2019-11-28 DIAGNOSIS — Z8616 Personal history of COVID-19: Secondary | ICD-10-CM | POA: Diagnosis not present

## 2019-11-28 DIAGNOSIS — M79661 Pain in right lower leg: Secondary | ICD-10-CM | POA: Diagnosis present

## 2019-11-28 DIAGNOSIS — G894 Chronic pain syndrome: Secondary | ICD-10-CM

## 2019-11-28 LAB — CBC WITH DIFFERENTIAL/PLATELET
Abs Immature Granulocytes: 0.02 10*3/uL (ref 0.00–0.07)
Basophils Absolute: 0 10*3/uL (ref 0.0–0.1)
Basophils Relative: 1 %
Eosinophils Absolute: 0.1 10*3/uL (ref 0.0–0.5)
Eosinophils Relative: 2 %
HCT: 26.3 % — ABNORMAL LOW (ref 36.0–46.0)
Hemoglobin: 8.6 g/dL — ABNORMAL LOW (ref 12.0–15.0)
Immature Granulocytes: 0 %
Lymphocytes Relative: 28 %
Lymphs Abs: 1.7 10*3/uL (ref 0.7–4.0)
MCH: 30.4 pg (ref 26.0–34.0)
MCHC: 32.7 g/dL (ref 30.0–36.0)
MCV: 92.9 fL (ref 80.0–100.0)
Monocytes Absolute: 0.3 10*3/uL (ref 0.1–1.0)
Monocytes Relative: 6 %
Neutro Abs: 3.7 10*3/uL (ref 1.7–7.7)
Neutrophils Relative %: 63 %
Platelets: 130 10*3/uL — ABNORMAL LOW (ref 150–400)
RBC: 2.83 MIL/uL — ABNORMAL LOW (ref 3.87–5.11)
RDW: 22.5 % — ABNORMAL HIGH (ref 11.5–15.5)
WBC: 5.8 10*3/uL (ref 4.0–10.5)
nRBC: 2.1 % — ABNORMAL HIGH (ref 0.0–0.2)

## 2019-11-28 LAB — RETICULOCYTES
Immature Retic Fract: 46.6 % — ABNORMAL HIGH (ref 2.3–15.9)
RBC.: 2.88 MIL/uL — ABNORMAL LOW (ref 3.87–5.11)
Retic Count, Absolute: 229.2 10*3/uL — ABNORMAL HIGH (ref 19.0–186.0)
Retic Ct Pct: 8 % — ABNORMAL HIGH (ref 0.4–3.1)

## 2019-11-28 LAB — URINALYSIS, ROUTINE W REFLEX MICROSCOPIC
Bilirubin Urine: NEGATIVE
Glucose, UA: NEGATIVE mg/dL
Hgb urine dipstick: NEGATIVE
Ketones, ur: NEGATIVE mg/dL
Leukocytes,Ua: NEGATIVE
Nitrite: NEGATIVE
Protein, ur: NEGATIVE mg/dL
Specific Gravity, Urine: 1.017 (ref 1.005–1.030)
pH: 7 (ref 5.0–8.0)

## 2019-11-28 LAB — COMPREHENSIVE METABOLIC PANEL
ALT: 25 U/L (ref 0–44)
AST: 72 U/L — ABNORMAL HIGH (ref 15–41)
Albumin: 3 g/dL — ABNORMAL LOW (ref 3.5–5.0)
Alkaline Phosphatase: 67 U/L (ref 38–126)
Anion gap: 12 (ref 5–15)
BUN: <5 mg/dL — ABNORMAL LOW (ref 6–20)
CO2: 23 mmol/L (ref 22–32)
Calcium: 9 mg/dL (ref 8.9–10.3)
Chloride: 109 mmol/L (ref 98–111)
Creatinine, Ser: 0.82 mg/dL (ref 0.44–1.00)
GFR, Estimated: >60 mL/min (ref >60)
Glucose, Bld: 106 mg/dL — ABNORMAL HIGH (ref 70–99)
Potassium: 4.9 mmol/L (ref 3.5–5.1)
Sodium: 144 mmol/L (ref 135–145)
Total Bilirubin: 1 mg/dL (ref 0.3–1.2)
Total Protein: 6.9 g/dL (ref 6.5–8.1)

## 2019-11-28 LAB — I-STAT BETA HCG BLOOD, ED (MC, WL, AP ONLY): I-stat hCG, quantitative: <5 m[IU]/mL (ref <5)

## 2019-11-28 MED ORDER — SODIUM CHLORIDE 0.45 % IV SOLN: INTRAVENOUS | Status: DC

## 2019-11-28 MED ORDER — DIPHENHYDRAMINE HCL 50 MG/ML IJ SOLN
25.0000 mg | Freq: Once | INTRAMUSCULAR | Status: AC
  Administered 2019-11-28: 25 mg via INTRAVENOUS
  Filled 2019-11-28: qty 1

## 2019-11-28 MED ORDER — HYDROMORPHONE HCL 2 MG/ML IJ SOLN
2.0000 mg | INTRAMUSCULAR | Status: AC
  Administered 2019-11-28: 2 mg via INTRAVENOUS
  Filled 2019-11-28: qty 1

## 2019-11-28 MED ORDER — ONDANSETRON 4 MG PO TBDP: 4.0000 mg | ORAL_TABLET | Freq: Three times a day (TID) | ORAL | 0 refills | Status: DC | PRN

## 2019-11-28 MED ORDER — ONDANSETRON HCL 4 MG/2ML IJ SOLN
4.0000 mg | INTRAMUSCULAR | Status: DC | PRN
  Administered 2019-11-28: 4 mg via INTRAVENOUS
  Filled 2019-11-28: qty 2

## 2019-11-28 MED ORDER — OXYCODONE HCL 5 MG PO TABS
15.0000 mg | ORAL_TABLET | Freq: Once | ORAL | Status: AC
  Administered 2019-11-28: 15 mg via ORAL
  Filled 2019-11-28: qty 3

## 2019-11-28 MED ORDER — SODIUM CHLORIDE (PF) 0.9 % IJ SOLN
INTRAMUSCULAR | Status: AC
  Filled 2019-11-28: qty 50

## 2019-11-28 MED ORDER — OXYCODONE HCL 20 MG PO TABS: 20.0000 mg | ORAL_TABLET | ORAL | 0 refills | Status: DC | PRN

## 2019-11-28 MED ORDER — IOHEXOL 350 MG/ML SOLN
100.0000 mL | Freq: Once | INTRAVENOUS | Status: AC | PRN
  Administered 2019-11-28: 100 mL via INTRAVENOUS

## 2019-11-28 NOTE — Progress Notes (Signed)
Oxycodone 20 my Q4H prn refilled today. #90 tablets for 15 days.   Jeanann Lewandowsky, MD  11/28/19  3:00 PM

## 2019-11-28 NOTE — ED Provider Notes (Signed)
[facility]-EMERGENCY DEPT Provider Note   CSN: [id number] Arrival date & time: 11/28/19  4098     History Chief Complaint  Patient presents with  . Sickle Cell Pain Crisis    Stephanie Burch is a 39 y.o. female with PMHx sickle cell anemia who presents to the ED today with complaint of pain to her BLEs and low back x 3 days. Pt reports she ran out of her regular home meds yesterday. She also developed nausea and NBNB emesis this morning which is atypical for patient prompting her to come to the ED. Pt denies any abdominal pain. She does mention that since being discharged from the hospital on 10/15 s/2 HCAP vs ACS she has been having heart palpitations intermittently with associated shortness of breath. She denies any chest pain. No fevers, chills, or any other complaints at this time.   The history is provided by the patient and medical records.       Past Medical History:  Diagnosis Date  . HSV infection   . Nausea   . Sickle cell anemia (HCC)   . Vitamin B12 deficiency 12/2018  . Vitamin D deficiency     Patient Active Problem List   Diagnosis Date Noted  . Pulmonary edema 11/11/2019  . Hypoxia   . Poor venous access 11/09/2019  . Essential hypertension 11/09/2019  . HCAP (healthcare-associated pneumonia) 11/07/2019  . Sepsis (HCC) 11/07/2019  . Acute kidney injury (HCC) 11/07/2019  . History of COVID-19 11/07/2019  . COVID-19 virus detected 10/22/2019  . Sore throat due to virus 10/22/2019  . Urinary frequency 04/20/2019  . Vaginal odor 04/20/2019  . Chest pain varying with breathing   . Fever and chills   . Anxiety 11/15/2018  . Vitamin D deficiency 11/15/2018  . Nausea 11/15/2018  . Abnormal pulse oximetry   . Hypokalemia 09/20/2018  . Medication management 09/14/2018  . Muscle spasms of both lower extremities 08/25/2018  . Subjective visual disturbance of right eye 08/25/2018  . 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 Chronic  pain syndrome 03/16/2018  . Chronic, continuous use of opioids 03/16/2018  . Tachycardia with heart rate 100-120 beats per minute   . Sickle cell crisis (HCC) 10/19/2017  . Leukocytosis 10/19/2017  . Thrombocytopenia (HCC) 10/19/2017  . Morbidly obese (HCC) 10/19/2017  . Sickle cell pain crisis (HCC) 10/15/2017    History reviewed. No pertinent surgical history.   OB History    Gravida  3   Para      Term      Preterm      AB      Living  3     SAB      TAB      Ectopic      Multiple      Live Births              Family History  Problem Relation Age of Onset  . Hypertension Mother   . Sickle cell trait Mother   . Diabetes Father   . Sickle cell trait Father     Social History   Tobacco Use  . Smoking status: Never Smoker  . Smokeless tobacco: Never Used  Vaping Use  . Vaping Use: Never used  Substance Use Topics  . Alcohol use: Not Currently  . Drug use: Never    Home Medications Prior to Admission medications   Medication Sig Start Date End Date Taking? Authorizing Provider  DULoxetine (CYMBALTA) 20 MG capsule  Take 20 mg by mouth daily.     [provider]  folic acid (FOLVITE) 1 MG tablet Take 1 tablet (1 mg total) by mouth daily. 11/15/18   Kallie Locks, FNP  hydroxyurea (HYDREA) 500 MG capsule Take 2,000 mg by mouth daily. May take with food to minimize GI side effects.     [provider]  ibuprofen (ADVIL) 800 MG tablet Take 1 tablet (800 mg total) by mouth every 8 (eight) hours as needed for headache, mild pain, moderate pain or cramping. 08/15/19   Kallie Locks, FNP  naloxone [facility]) 0.4 MG/ML injection As needed Patient taking differently: Inject 0.4 mg into the skin as needed (overdose). As needed 09/14/18   Kallie Locks, FNP  ondansetron (ZOFRAN ODT) 4 MG disintegrating tablet Take 1 tablet (4 mg total) by mouth every 8 (eight) hours as needed for nausea or vomiting. 11/28/19   Patrcia Schnepp, PA-C    ondansetron (ZOFRAN) 4 MG tablet Take 4 mg by mouth every 8 (eight) hours as needed. 11/25/19   [provider]  oxyCODONE ER (XTAMPZA ER) 18 MG C12A Take 18 mg by mouth every 12 (twelve) hours. 11/22/19   Kallie Locks, FNP  Oxycodone HCl 20 MG TABS Take 1 tablet (20 mg total) by mouth every 4 (four) hours as needed (for pain). 11/28/19   Quentin Angst, MD  tiZANidine (ZANAFLEX) 4 MG capsule Take 1 capsule (4 mg total) by mouth 3 (three) times daily as needed for muscle spasms. 10/07/19 05/04/20  Kallie Locks, FNP  valACYclovir (VALTREX) 500 MG tablet Take Valtrex 500 mg daily for suppression of outbreaks. Take Valtrex 1000 mg daily X 1 week for outbreaks. 10/20/19   Kallie Locks, FNP  Vitamin D, Ergocalciferol, (DRISDOL) 1.25 MG (50000 UT) CAPS capsule Take 1 capsule (50,000 Units total) by mouth every 7 (seven) days. 12/21/18   Kallie Locks, FNP  voxelotor (OXBRYTA) 500 MG TABS tablet Take 1,500 mg by mouth daily. For Sickle Cell Anemia    [provider]    Allergies    Patient has no known allergies.  Review of Systems   Review of Systems  Constitutional: Negative for chills and fever.  Cardiovascular: Positive for palpitations. Negative for chest pain.  Gastrointestinal: Positive for nausea and vomiting. Negative for abdominal pain, constipation and diarrhea.  Musculoskeletal: Positive for arthralgias and back pain.  All other systems reviewed and are negative.   Physical Exam Updated Vital Signs BP (!) 155/98 (BP Location: Right Arm)   Pulse 84   Temp 98.4 F (36.9 C) (Oral)   Resp 16   Ht 5\' 6"  (1.676 m)   Wt 127 kg   LMP 10/08/2019 (Approximate)   SpO2 100%   BMI 45.19 kg/m   Physical Exam Vitals and nursing note reviewed.  Constitutional:      Appearance: She is obese. She is not ill-appearing or diaphoretic.     Comments: Actively vomiting  HENT:     Head: Normocephalic and atraumatic.  Eyes:     Conjunctiva/sclera:  Conjunctivae normal.  Cardiovascular:     Rate and Rhythm: Normal rate and regular rhythm.     Pulses: Normal pulses.  Pulmonary:     Effort: Pulmonary effort is normal.     Breath sounds: Normal breath sounds. No wheezing, rhonchi or rales.  Abdominal:     Palpations: Abdomen is soft.     Tenderness: There is no abdominal tenderness. There is no guarding or  rebound.  Musculoskeletal:        General: Tenderness present.     Cervical back: Neck supple.     Comments: Diffuse BLE TTP; no erythema, edema, or increased warmth to the touch   Skin:    General: Skin is warm and dry.  Neurological:     Mental Status: She is alert.     ED Results / Procedures / Treatments   Labs (all labs ordered are listed, but only abnormal results are displayed) Labs Reviewed  COMPREHENSIVE METABOLIC PANEL - Abnormal; Notable for the following components:      Result Value   Glucose, Bld 106 (*)    BUN <5 (*)    Albumin 3.0 (*)    AST 72 (*)    All other components within normal limits  CBC WITH DIFFERENTIAL/PLATELET - Abnormal; Notable for the following components:   RBC 2.83 (*)    Hemoglobin 8.6 (*)    HCT 26.3 (*)    RDW 22.5 (*)    Platelets 130 (*)    nRBC 2.1 (*)    All other components within normal limits  RETICULOCYTES - Abnormal; Notable for the following components:   Retic Ct Pct 8.0 (*)    RBC. 2.88 (*)    Retic Count, Absolute 229.2 (*)    Immature Retic Fract 46.6 (*)    All other components within normal limits  URINALYSIS, ROUTINE W REFLEX MICROSCOPIC  I-STAT BETA HCG BLOOD, ED ([facility], [facility], [facility] ONLY)    EKG EKG Interpretation  Date/Time:  Monday November 28 2019 10:34:19 EDT Ventricular Rate:  81 PR Interval:    QRS Duration: 88 QT Interval:  412 QTC Calculation: 479 R Axis:   92 Text Interpretation: Sinus rhythm Borderline right axis deviation Abnormal R-wave progression, early transition Borderline T abnormalities, anterior leads No STEMI Confirmed by Alvester Chou  [phone]) on 11/28/2019 11:10:50 AM   Radiology CT Angio Chest PE W/Cm &/Or Wo Cm  Result Date: 11/28/2019 CLINICAL DATA:  Sickle cell pain EXAM: CT ANGIOGRAPHY CHEST WITH CONTRAST TECHNIQUE: Multidetector CT imaging of the chest was performed using the standard protocol during bolus administration of intravenous contrast. Multiplanar CT image reconstructions and MIPs were obtained to evaluate the vascular anatomy. CONTRAST:  OMNIPAQUE IOHEXOL 350 MG/ML SOLN COMPARISON:  CT 04/19/2019, x-ray 11/28/2019 FINDINGS: Cardiovascular: Evaluation of the pulmonary arterial tree is limited by contrast bolus timing and respiratory motion artifact. There is no evidence of filling defect to the lobar branch level to suggest acute pulmonary embolism. Main pulmonary trunk measures 3.5 cm in diameter suggesting component of pulmonary arterial hypertension. Normal RV to LV ratio without evidence of acute right heart strain. Heart size is mildly enlarged. No significant pericardial effusion. Thoracic aorta is nonaneurysmal. Mediastinum/Nodes: No enlarged mediastinal, hilar, or axillary lymph nodes. Thyroid gland, trachea, and esophagus demonstrate no significant findings. Lungs/Pleura: Small to moderate right-sided pleural effusion which appears partially loculated along the lateral aspect of the hemithorax. Small left-sided pleural effusion. There are patchy dependent airspace consolidations, right worse than left. Previously described right middle lobe pulmonary nodule is not visualized on today's exam although there is significant atelectasis in the adjacent lung. No pneumothorax. Upper Abdomen: Heterogeneous appearance of the visualized spleen is similar compared to the prior study. 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 No new or acute osseous findings. Review of the MIP images confirms the above findings. IMPRESSION: 1. Slightly limited exam. No evidence  of acute pulmonary embolism  to the lobar branch level. 2. Small to moderate right-sided pleural effusion which appears partially loculated along the lateral aspect of the hemithorax. 3. Small left-sided pleural effusion. 4. Patchy dependent airspace consolidations, right worse than left, which may represent atelectasis and/or pneumonia. 5. 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 Electronically Signed   By: Duanne GuessNicholas  Plundo D.O.   On: 11/28/2019 12:47   DG Chest Port 1 View  Result Date: 11/28/2019 CLINICAL DATA:  Chest pain, sickle cell pain crisis EXAM: PORTABLE CHEST 1 VIEW COMPARISON:  11/16/2019 FINDINGS: Stable mild cardiomegaly. Persistent bilateral pleural effusions with patchy bibasilar opacities with slight improvement within the left lung base. No pneumothorax. No acute osseous findings. IMPRESSION: Persistent bilateral pleural effusions with patchy bibasilar opacities with slight improvement within the left lung base. Electronically Signed   By: Duanne GuessNicholas  Plundo D.O.   On: 11/28/2019 11:24    Procedures Procedures (including critical care time)  Medications Ordered in ED Medications  ondansetron (ZOFRAN) injection 4 mg (4 mg Intravenous Given 11/28/19 1050)  0.45 % sodium chloride infusion ( Intravenous Stopped 11/28/19 1209)  sodium chloride (PF) 0.9 % injection (  Not Given 11/28/19 1209)  diphenhydrAMINE (BENADRYL) injection 25 mg (25 mg Intravenous Given 11/28/19 1052)  HYDROmorphone (DILAUDID) injection 2 mg (2 mg Intravenous Given 11/28/19 1049)  HYDROmorphone (DILAUDID) injection 2 mg (2 mg Intravenous Given 11/28/19 1123)  iohexol (OMNIPAQUE) 350 MG/ML injection 100 mL (100 mLs Intravenous Contrast Given 11/28/19 1211)  oxyCODONE (Oxy IR/ROXICODONE) immediate release tablet 15 mg (15 mg Oral Given 11/28/19 1259)    ED Course  I have reviewed the triage vital signs and the nursing notes.  Pertinent labs & imaging results that  were available during my care of the patient were reviewed by me and considered in my medical decision making (see chart for details).    MDM Rules/Calculators/A&P                          39 year old female with a history of sickle cell anemia presents to the ED today with complaint of regular sickle cell pain from her lower back and bilateral lower extremities.  She also reports nausea and nonbloody nonbilious emesis that began today prompting her to come to the ED.  On arrival to the ED patient is afebrile, nontachycardic.  Mildly tachypneic at 25.  She does report she has felt heart palpitations and shortness of breath intermittently since being discharged from the hospital on 10/15 secondary to HCAP versus acute chest syndrome.  Of note patient did have COVID-19 infection in September.  Does not appear she had PE rule out while in the hospital, she is nontachycardic today and does not complain of any chest pain however may consider CTA.  We will plan to treat nausea with IV Zofran, treat sickle cell pain with 2 rounds of IV Dilaudid.  Will obtain labs at this time and reevaluate.  Will obtain repeat chest x-ray at this time to ensure resolution of infection.   CXR with slight improvement of the infiltrate in the left lung field CBC without leukocytosis. Hgb stable at 8.6.  CMP without electrolyte abnormalities Reticulocytes unchanged from previous   After IV zofran pt no longer vomiting; still complains of pain and a racing heart beat. Given this will obtain CTA to rule out PE. Afterwards will plan to fluid challenge with PO pain meds  CTA negative for PE  Pt able to tolerate  PO fluids without difficulty. No longer vomiting however still having pain uncontrolled with IV dilaudid and PO oxycodone. Have discussed with pt going home with her regular meds vs admission - she reports that she ran out of her meds and currently there is an issue with her insurance and she does not have anything at  home. Reports her PCP is aware. Unfortunately day clinic not taking pt at this time due to understaffing and pt will need admission.   Discussed case with Dr. Hyman Hopes who is offering to send patient home with her regular oxycodone prescription to prevent additional hospitalization at this time. Pt is in agreement with this. Feel she is stable for discharge otherwise.   This note was prepared using Dragon voice recognition software and may include unintentional dictation errors due to the inherent limitations of voice recognition software.  Final Clinical Impression(s) / ED Diagnoses Final diagnoses:  Sickle cell pain crisis (HCC)    Rx / DC Orders ED Discharge Orders         Ordered    ondansetron (ZOFRAN ODT) 4 MG disintegrating tablet  Every 8 hours PRN        11/28/19 1500           Discharge Instructions     Please follow up with your PCP regarding your ED visit today Return to the ED for any worsening symptoms        Tanda Rockers, PA-C 11/28/19 1501    Terald Sleeper, MD 11/28/19 1942

## 2019-11-28 NOTE — ED Triage Notes (Addendum)
Patient c/o sickle cell pain in the upper, mid, and lower back, and bilateral legs x 3 days.  Patient also c/o nausea. Patient states she ran out of pain medication (oxycontin) 3 days ago.

## 2019-11-28 NOTE — Telephone Encounter (Signed)
Sent to provider 

## 2019-11-28 NOTE — Discharge Instructions (Signed)
Please follow up with your PCP regarding your ED visit today Return to the ED for any worsening symptoms

## 2019-11-28 NOTE — ED Notes (Signed)
Pt ambulating to restroom room with no distress. Pt tolerating ginger ale at this time

## 2019-11-29 ENCOUNTER — Telehealth: Payer: Self-pay

## 2019-11-29 NOTE — Telephone Encounter (Signed)
Transition Care Management Follow-up Telephone Call  Date of discharge and from where: 11/28/2019 Stephanie Burch   How have you been since you were released from the hospital? Doing ok but still vomiting   Any questions or concerns? Yes  Items Reviewed:  Did the pt receive and understand the discharge instructions provided? Yes   Medications obtained and verified? Yes   Other? No   Any new allergies since your discharge? No   Dietary orders reviewed? Yes  Do you have support at home? Yes   Home Care and Equipment/Supplies: Were home health services ordered? not applicable If so, what is the name of the agency?   Has the agency set up a time to come to the patient's home? not applicable Were any new equipment or medical supplies ordered?  No What is the name of the medical supply agency?  Were you able to get the supplies/equipment? not applicable Do you have any questions related to the use of the equipment or supplies? No  Functional Questionnaire: (I = Independent and D = Dependent) ADLs: I  Bathing/Dressing- I  Meal Prep- I  Eating- I  Maintaining continence- I  Transferring/Ambulation- I  Managing Meds- I  Follow up appointments reviewed:   PCP Hospital f/u appt confirmed? Yes  Scheduled to see Stephanie Burch on 12/16/2019@ 11:20  Specialist Hospital f/u appt confirmed? No    Are transportation arrangements needed? No   If their condition worsens, is the pt aware to call PCP or go to the Emergency Dept.? Yes  Was the patient provided with contact information for the PCP's office or ED? Yes  Was to pt encouraged to call back with questions or concerns? Yes

## 2019-12-02 ENCOUNTER — Encounter: Payer: Self-pay | Admitting: Family Medicine

## 2019-12-02 ENCOUNTER — Other Ambulatory Visit: Payer: Self-pay | Admitting: Internal Medicine

## 2019-12-03 ENCOUNTER — Other Ambulatory Visit: Payer: Self-pay | Admitting: Internal Medicine

## 2019-12-03 DIAGNOSIS — F1190 Opioid use, unspecified, uncomplicated: Secondary | ICD-10-CM

## 2019-12-03 DIAGNOSIS — G894 Chronic pain syndrome: Secondary | ICD-10-CM

## 2019-12-03 DIAGNOSIS — D571 Sickle-cell disease without crisis: Secondary | ICD-10-CM

## 2019-12-03 MED ORDER — OXYCODONE HCL 20 MG PO TABS: 20.0000 mg | ORAL_TABLET | ORAL | 0 refills | Status: DC | PRN

## 2019-12-03 NOTE — Progress Notes (Signed)
OxyCodone 20 mg #90 tab resent to [facility] on [address] [place] Phone # [phone]  Jeanann Lewandowsky, MD  12/03/19 5:17 PM

## 2019-12-05 ENCOUNTER — Other Ambulatory Visit: Payer: Self-pay

## 2019-12-05 DIAGNOSIS — F1190 Opioid use, unspecified, uncomplicated: Secondary | ICD-10-CM

## 2019-12-05 DIAGNOSIS — G894 Chronic pain syndrome: Secondary | ICD-10-CM

## 2019-12-05 DIAGNOSIS — D571 Sickle-cell disease without crisis: Secondary | ICD-10-CM

## 2019-12-05 NOTE — Telephone Encounter (Signed)
Patient is requesting a refill of the following medications: Requested Prescriptions   Pending Prescriptions Disp Refills  . Oxycodone HCl 20 MG TABS 90 tablet 0    Sig: Take 1 tablet (20 mg total) by mouth every 4 (four) hours as needed (for pain).   Patient states that the [facility] doesn't have the Oxycodone HCI 20 MG , but they do have the 10 MG and want to know if she could get them instead and take two daily

## 2019-12-06 ENCOUNTER — Telehealth: Payer: Self-pay | Admitting: Internal Medicine

## 2019-12-07 ENCOUNTER — Other Ambulatory Visit: Payer: Self-pay | Admitting: *Deleted

## 2019-12-07 ENCOUNTER — Other Ambulatory Visit: Payer: Self-pay | Admitting: Family Medicine

## 2019-12-07 DIAGNOSIS — D571 Sickle-cell disease without crisis: Secondary | ICD-10-CM

## 2019-12-07 DIAGNOSIS — F1190 Opioid use, unspecified, uncomplicated: Secondary | ICD-10-CM

## 2019-12-07 DIAGNOSIS — G894 Chronic pain syndrome: Secondary | ICD-10-CM

## 2019-12-07 MED ORDER — OXYCODONE HCL 10 MG PO TABS: ORAL_TABLET | ORAL | 0 refills | Status: DC

## 2019-12-07 NOTE — Patient Instructions (Signed)
Visit Information  Ms. Stephanie Burch  - as a part of your Medicaid benefit, you are eligible for care management and care coordination services at no cost or copay. I was unable to reach you by phone today but would be happy to help you with your health related needs. Please feel free to call me @ [phone].   A member of the Managed Medicaid care management team will reach out to you again over the next 7-14 days.   Estanislado Emms RN, BSN Stephanie Burch  [facility]

## 2019-12-07 NOTE — Patient Outreach (Signed)
Care Coordination  12/07/2019  Stephanie Burch 21-Jan-1981 [id number]   A second unsuccessful telephone outreach was attempted today. The patient was referred to the case management team for assistance with care management and care coordination.   Follow Up Plan: A HIPAA compliant phone message was left for the patient providing contact information and requesting a return call.  The Managed Medicaid care management team will reach out to the patient again over the next 7-14 days.   Estanislado Emms RN, BSN [place]  [facility]

## 2019-12-07 NOTE — Telephone Encounter (Signed)
Sent to Provider 

## 2019-12-12 ENCOUNTER — Telehealth: Payer: Self-pay | Admitting: Clinical

## 2019-12-13 ENCOUNTER — Other Ambulatory Visit: Payer: Self-pay | Admitting: *Deleted

## 2019-12-13 NOTE — Patient Outreach (Signed)
Care Coordination  12/13/2019  Stephanie Burch Aug 24, 1980 [id number]  Third unsuccessful telephone outreach was attempted today. The patient was referred to the case management team for assistance with care management and care coordination. The patient's primary care provider has been notified of our unsuccessful attempts to make or maintain contact with the patient. The care management team is pleased to engage with this patient at any time in the future should he/she be interested in assistance from the care management team.   Follow Up Plan: The Managed Medicaid care management team is available to follow up with the patient after provider conversation with the patient regarding recommendation for care management engagement and subsequent re-referral to the care management team.   Estanislado Emms RN, BSN [place]  [facility] RN Care Coordinator

## 2019-12-13 NOTE — Patient Instructions (Signed)
Visit Information  Ms. Mychal Durio  - as a part of your Medicaid benefit, you are eligible for care management and care coordination services at no cost or copay. I was unable to reach you by phone today but would be happy to help you with your health related needs. Please feel free to call me @[phone].   [phone] RN, BSN [place]  [facility] RN Care Coordinator

## 2019-12-14 NOTE — Telephone Encounter (Signed)
Integrated Behavioral Health General Follow Up Note  12/14/2019 Name: Gissell Barra MRN: [id number] DOB: 1980/05/10 Jaxyn Mestas is a 39 y.o. year old female who sees Kallie Locks, FNP for primary care. LCSW was initially consulted to support with stress precipitating sickle cell crisis and illness anxiety  Interpreter: No.   Interpreter Name & Language: none  Assessment: Patient experiencing stress and anxiety related to recent experience with COVID and then subsequent conditions that led to recent hospitalization.  Ongoing Intervention: This was an unsuccessful contact. CSW is no longer able to meet with patient at her next PCP visit 12/16/19. Called patient on 11/4 and on today, 12/12/19, to advise patient of this and reschedule IBH session. LVM on both days, as patient did not answer. No return call. CSW will continue to follow.   Review of patient status, including review of consultants reports, relevant laboratory and other test results, and collaboration with appropriate care team members and the patient's provider was performed as part of comprehensive patient evaluation and provision of services.     Follow up Plan: 1. CSW available from clinic as needed.   Abigail Butts, LCSW [facility] [phone]

## 2019-12-15 ENCOUNTER — Other Ambulatory Visit: Payer: Self-pay | Admitting: Family Medicine

## 2019-12-15 ENCOUNTER — Telehealth: Payer: Self-pay | Admitting: Family Medicine

## 2019-12-15 DIAGNOSIS — F1190 Opioid use, unspecified, uncomplicated: Secondary | ICD-10-CM

## 2019-12-15 DIAGNOSIS — G894 Chronic pain syndrome: Secondary | ICD-10-CM

## 2019-12-15 DIAGNOSIS — D571 Sickle-cell disease without crisis: Secondary | ICD-10-CM

## 2019-12-15 MED ORDER — OXYCODONE HCL 20 MG PO TABS: 20.0000 mg | ORAL_TABLET | ORAL | 0 refills | Status: DC | PRN

## 2019-12-16 ENCOUNTER — Ambulatory Visit: Payer: Self-pay | Admitting: Family Medicine

## 2019-12-19 NOTE — Telephone Encounter (Signed)
Sent to provider 

## 2019-12-26 ENCOUNTER — Telehealth: Payer: Self-pay | Admitting: Family Medicine

## 2019-12-26 DIAGNOSIS — F1190 Opioid use, unspecified, uncomplicated: Secondary | ICD-10-CM

## 2019-12-26 DIAGNOSIS — G894 Chronic pain syndrome: Secondary | ICD-10-CM

## 2019-12-26 DIAGNOSIS — D571 Sickle-cell disease without crisis: Secondary | ICD-10-CM

## 2019-12-27 ENCOUNTER — Other Ambulatory Visit: Payer: Self-pay | Admitting: Family Medicine

## 2019-12-27 DIAGNOSIS — D571 Sickle-cell disease without crisis: Secondary | ICD-10-CM

## 2019-12-27 DIAGNOSIS — F1190 Opioid use, unspecified, uncomplicated: Secondary | ICD-10-CM

## 2019-12-27 DIAGNOSIS — G894 Chronic pain syndrome: Secondary | ICD-10-CM

## 2019-12-27 MED ORDER — XTAMPZA ER 18 MG PO C12A: 18.0000 mg | EXTENDED_RELEASE_CAPSULE | Freq: Two times a day (BID) | ORAL | 0 refills | Status: DC

## 2019-12-27 NOTE — Telephone Encounter (Signed)
Pt appointment 01/06/20

## 2020-01-02 ENCOUNTER — Other Ambulatory Visit: Payer: Self-pay | Admitting: Family Medicine

## 2020-01-02 ENCOUNTER — Telehealth: Payer: Self-pay | Admitting: Family Medicine

## 2020-01-02 DIAGNOSIS — D571 Sickle-cell disease without crisis: Secondary | ICD-10-CM

## 2020-01-02 DIAGNOSIS — F1190 Opioid use, unspecified, uncomplicated: Secondary | ICD-10-CM

## 2020-01-02 DIAGNOSIS — G894 Chronic pain syndrome: Secondary | ICD-10-CM

## 2020-01-02 MED ORDER — OXYCODONE HCL 20 MG PO TABS: 20.0000 mg | ORAL_TABLET | ORAL | 0 refills | Status: DC | PRN

## 2020-01-06 ENCOUNTER — Encounter: Payer: Self-pay | Admitting: Family Medicine

## 2020-01-06 ENCOUNTER — Ambulatory Visit (INDEPENDENT_AMBULATORY_CARE_PROVIDER_SITE_OTHER): Payer: Medicaid Other | Admitting: Family Medicine

## 2020-01-06 ENCOUNTER — Other Ambulatory Visit: Payer: Self-pay

## 2020-01-06 VITALS — BP 142/81 | HR 115 | Temp 98.6°F | Resp 18 | Ht 65.0 in | Wt 297.6 lb

## 2020-01-06 DIAGNOSIS — F1190 Opioid use, unspecified, uncomplicated: Secondary | ICD-10-CM | POA: Diagnosis not present

## 2020-01-06 DIAGNOSIS — G894 Chronic pain syndrome: Secondary | ICD-10-CM

## 2020-01-06 DIAGNOSIS — F419 Anxiety disorder, unspecified: Secondary | ICD-10-CM

## 2020-01-06 DIAGNOSIS — Z09 Encounter for follow-up examination after completed treatment for conditions other than malignant neoplasm: Secondary | ICD-10-CM | POA: Diagnosis not present

## 2020-01-06 DIAGNOSIS — D571 Sickle-cell disease without crisis: Secondary | ICD-10-CM

## 2020-01-06 NOTE — Progress Notes (Signed)
[facility]   Established Patient Office Visit  Subjective:  Patient ID: Stephanie Burch, female    DOB: Apr 14, 1980  Age: 39 y.o. MRN: [id number]  CC:  Chief Complaint  Patient presents with  . Follow-up    HPI Stephanie StarchKimaada Dyke is a 39 year old female who presents for Hospital Follow Up today.    Patient Active Problem List   Diagnosis Date Noted  . Pulmonary edema 11/11/2019  . Hypoxia   . Poor venous access 11/09/2019  . Essential hypertension 11/09/2019  . HCAP (healthcare-associated pneumonia) 11/07/2019  . Sepsis (HCC) 11/07/2019  . Acute kidney injury (HCC) 11/07/2019  . History of COVID-19 11/07/2019  . COVID-19 virus detected 10/22/2019  . Sore throat due to virus 10/22/2019  . Urinary frequency 04/20/2019  . Vaginal odor 04/20/2019  . Chest pain varying with breathing   . Fever and chills   . Anxiety 11/15/2018  . Vitamin D deficiency 11/15/2018  . Nausea 11/15/2018  . Abnormal pulse oximetry   . Hypokalemia 09/20/2018  . Medication management 09/14/2018  . Muscle spasms of both lower extremities 08/25/2018  . Subjective visual disturbance of right eye 08/25/2018  . 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 Chronic pain syndrome 03/16/2018  . Chronic, continuous use of opioids 03/16/2018  . Tachycardia with heart rate 100-120 beats per minute   . Sickle cell crisis (HCC) 10/19/2017  . Leukocytosis 10/19/2017  . Thrombocytopenia (HCC) 10/19/2017  . Morbidly obese (HCC) 10/19/2017  . Sickle cell pain crisis (HCC) 10/15/2017    Current Status: Since her last office visit, she is doing well with no complaints. She states that she has pain in her arms and legs. She rates her pain today at 5-6/10. She last took pain medication 0800 today and 1700-1800 last night. She has not had a hospital visit for Sickle Cell Crisis since 11/28/2019 where she was treated and discharged on 11/18/2019. She is currently  taking all medications as prescribed and staying well hydrated. she reports occasional nausea, constipation, dizziness and headaches. She is currently following up for counseling with Abigail ButtsSusan Porter, LCSW in our clinic. No depression or anxiety reported today. She will reschedule soon. She is also going to begin daily exercising. She has recently joined a Sickle Cell support group and enjoys collaboration. She denies fevers, chills, fatigue, recent infections, weight loss, and night sweats. She has not had any visual changes, and falls. She reports shortness of breath occasionally. No chest pain, heart palpitations, and cough reported. Denies GI problems such as vomiting, diarrhea, and constipation. She has no reports of blood in stools, dysuria and hematuria. She is taking all medications as prescribed.  Past Medical History:  Diagnosis Date  . HSV infection   . Nausea   . Sickle cell anemia (HCC)   . Vitamin B12 deficiency 12/2018  . Vitamin D deficiency     No past surgical history on file.  Family History  Problem Relation Age of Onset  . Hypertension Mother   . Sickle cell trait Mother   . Diabetes Father   . Sickle cell trait Father     Social History   Socioeconomic History  . Marital status: Single    Spouse name: Not on file  . Number of children: Not on file  . Years of education: Not on file  . Highest education level: Not on file  Occupational History  . Not on file  Tobacco Use  .  Smoking status: Never Smoker  . Smokeless tobacco: Never Used  Vaping Use  . Vaping Use: Never used  Substance and Sexual Activity  . Alcohol use: Not Currently  . Drug use: Never  . Sexual activity: Yes    Birth control/protection: None  Other Topics Concern  . Not on file  Social History Narrative  . Not on file   Social Determinants of Health   Financial Resource Strain:   . Difficulty of Paying Living Expenses: Not on file  Food Insecurity:   . Worried About Brewing technologist in the Last Year: Not on file  . Ran Out of Food in the Last Year: Not on file  Transportation Needs:   . Lack of Transportation (Medical): Not on file  . Lack of Transportation (Non-Medical): Not on file  Physical Activity:   . Days of Exercise per Week: Not on file  . Minutes of Exercise per Session: Not on file  Stress:   . Feeling of Stress : Not on file  Social Connections:   . Frequency of Communication with Friends and Family: Not on file  . Frequency of Social Gatherings with Friends and Family: Not on file  . Attends Religious Services: Not on file  . Active Member of Clubs or Organizations: Not on file  . Attends Banker Meetings: Not on file  . Marital Status: Not on file  Intimate Partner Violence:   . Fear of Current or Ex-Partner: Not on file  . Emotionally Abused: Not on file  . Physically Abused: Not on file  . Sexually Abused: Not on file    Outpatient Medications Prior to Visit  Medication Sig Dispense Refill  . folic acid (FOLVITE) 1 MG tablet Take 1 tablet (1 mg total) by mouth daily. 30 tablet 11  . ibuprofen (ADVIL) 800 MG tablet Take 1 tablet (800 mg total) by mouth every 8 (eight) hours as needed for headache, mild pain, moderate pain or cramping. 30 tablet 6  . naloxone (NARCAN) 0.4 MG/ML injection As needed (Patient taking differently: Inject 0.4 mg into the skin daily as needed (overdose). ) 1 mL 0  . ondansetron (ZOFRAN ODT) 4 MG disintegrating tablet Take 1 tablet (4 mg total) by mouth every 8 (eight) hours as needed for nausea or vomiting. 20 tablet 0  . ondansetron (ZOFRAN) 4 MG tablet Take 4 mg by mouth every 8 (eight) hours as needed for nausea or vomiting.     Marland Kitchen oxyCODONE ER (XTAMPZA ER) 18 MG C12A Take 18 mg by mouth every 12 (twelve) hours. 60 capsule 0  . Oxycodone HCl 20 MG TABS Take 1 tablet (20 mg total) by mouth every 4 (four) hours as needed (for pain). 90 tablet 0  . tiZANidine (ZANAFLEX) 4 MG capsule Take 1 capsule (4 mg  total) by mouth 3 (three) times daily as needed for muscle spasms. 90 capsule 6  . valACYclovir (VALTREX) 500 MG tablet Take Valtrex 500 mg daily for suppression of outbreaks. Take Valtrex 1000 mg daily X 1 week for outbreaks. (Patient taking differently: Take 500 mg by mouth daily as needed (for breakouts). ) 90 tablet 11  . Vitamin D, Ergocalciferol, (DRISDOL) 1.25 MG (50000 UT) CAPS capsule Take 1 capsule (50,000 Units total) by mouth every 7 (seven) days. 5 capsule 6  . voxelotor (OXBRYTA) 500 MG TABS tablet Take 1,500 mg by mouth daily.      No facility-administered medications prior to visit.    No Known Allergies  ROS Review of Systems  Constitutional: Negative.   HENT: Negative.   Eyes: Negative.   Respiratory: Negative.   Cardiovascular: Negative.   Gastrointestinal: Positive for abdominal distention (obese).  Endocrine: Negative.   Genitourinary: Negative.   Musculoskeletal: Positive for arthralgias (generalized joint pain).  Skin: Negative.   Allergic/Immunologic: Negative.   Neurological: Positive for dizziness (occasional ) and headaches (occasional ).  Psychiatric/Behavioral: Negative.       Objective:    Physical Exam Vitals and nursing note reviewed.  Constitutional:      Appearance: Normal appearance.  HENT:     Head: Normocephalic and atraumatic.     Right Ear: External ear normal.     Left Ear: External ear normal.     Nose: Nose normal.     Mouth/Throat:     Mouth: Mucous membranes are moist.     Pharynx: Oropharynx is clear.  Cardiovascular:     Rate and Rhythm: Normal rate and regular rhythm.     Pulses: Normal pulses.     Heart sounds: Normal heart sounds.  Pulmonary:     Effort: Pulmonary effort is normal.     Breath sounds: Normal breath sounds.  Abdominal:     Palpations: Abdomen is soft.  Musculoskeletal:        General: Normal range of motion.     Cervical back: Normal range of motion and neck supple.  Skin:    General: Skin is warm  and dry.  Neurological:     General: No focal deficit present.     Mental Status: She is alert and oriented to person, place, and time.  Psychiatric:        Mood and Affect: Mood normal.        Behavior: Behavior normal.        Thought Content: Thought content normal.        Judgment: Judgment normal.     BP (!) 142/81 (BP Location: Left Arm, Patient Position: Sitting, Cuff Size: Large)   Pulse (!) 115   Temp 98.6 F (37 C) (Temporal)   Resp 18   Ht 5\' 5"  (1.651 m)   Wt 297 lb 9.6 oz (135 kg)   SpO2 99%   BMI 49.52 kg/m  Wt Readings from Last 3 Encounters:  01/06/20 297 lb 9.6 oz (135 kg)  11/28/19 280 lb (127 kg)  11/18/19 159 lb 13.3 oz (72.5 kg)     Health Maintenance Due  Topic Date Due  . Hepatitis C Screening  Never done  . PAP SMEAR-Modifier  Never done    There are no preventive care reminders to display for this patient.  Lab Results  Component Value Date   TSH 1.120 11/15/2018   Lab Results  Component Value Date   WBC 5.8 11/28/2019   HGB 8.6 (L) 11/28/2019   HCT 26.3 (L) 11/28/2019   MCV 92.9 11/28/2019   PLT 130 (L) 11/28/2019   Lab Results  Component Value Date   NA 144 11/28/2019   K 4.9 11/28/2019   CO2 23 11/28/2019   GLUCOSE 106 (H) 11/28/2019   BUN <5 (L) 11/28/2019   CREATININE 0.82 11/28/2019   BILITOT 1.0 11/28/2019   ALKPHOS 67 11/28/2019   AST 72 (H) 11/28/2019   ALT 25 11/28/2019   PROT 6.9 11/28/2019   ALBUMIN 3.0 (L) 11/28/2019   CALCIUM 9.0 11/28/2019   ANIONGAP 12 11/28/2019   Lab Results  Component Value Date   CHOL 84 (L) 11/15/2018   Lab  Results  Component Value Date   HDL 33 (L) 11/15/2018   Lab Results  Component Value Date   LDLCALC 34 11/15/2018   Lab Results  Component Value Date   TRIG 149 11/07/2019   Lab Results  Component Value Date   CHOLHDL 2.5 11/15/2018   Lab Results  Component Value Date   HGBA1C 4.1 09/11/2017      Assessment & Plan:   1. Hospital discharge follow-up  2. Hb-SS  disease without crisis [facility]) She is doing well today r/t her chronic pain management. She will continue to take pain medications as prescribed; will continue to avoid extreme heat and cold; will continue to eat a healthy diet and drink at least 64 ounces of water daily; continue stool softener as needed; will avoid colds and flu; will continue to get plenty of sleep and rest; will continue to avoid high stressful situations and remain infection free; will continue Folic Acid 1 mg daily to avoid sickle cell crisis. Continue to follow up with Hematologist as needed.  - Ambulatory referral to Physical Therapy  3. Chronic pain syndrome - Ambulatory referral to Physical Therapy  4. Chronic, continuous use of opioids  5. Anxiety Moderate today.   6. Follow up She will follow up in 2 months.  No orders of the defined types were placed in this encounter.   Orders Placed This Encounter  Procedures  . Ambulatory referral to Physical Therapy     Referral Orders     Ambulatory referral to Physical Therapy   [name], MSN, ANE, FNP-BC [facility] [address] [place], [place] [address] [phone] fax  Problem List Items Addressed This Visit      Other   Anxiety   Chronic pain syndrome   Relevant Orders   Ambulatory referral to Physical Therapy   Chronic, continuous use of opioids   Hb-SS disease without crisis [facility])   Relevant Orders   Ambulatory referral to Physical Therapy    Other Visit Diagnoses    Hospital discharge follow-up    -  Primary   Follow up          No orders of the defined types were placed in this encounter.   Follow-up: Return in about 2 months (around 03/08/2020).    Kallie Locks, FNP

## 2020-01-10 ENCOUNTER — Other Ambulatory Visit: Payer: Self-pay

## 2020-01-10 ENCOUNTER — Encounter (HOSPITAL_COMMUNITY): Payer: Self-pay | Admitting: Family Medicine

## 2020-01-10 ENCOUNTER — Telehealth (HOSPITAL_COMMUNITY): Payer: Self-pay | Admitting: *Deleted

## 2020-01-10 ENCOUNTER — Inpatient Hospital Stay (HOSPITAL_COMMUNITY)
Admission: AD | Admit: 2020-01-10 | Discharge: 2020-01-15 | DRG: 812 | Disposition: A | Discharge status: Home / Self Care | Payer: Medicaid Other | Attending: Internal Medicine | Admitting: Internal Medicine

## 2020-01-10 ENCOUNTER — Telehealth: Payer: Self-pay | Admitting: Family Medicine

## 2020-01-10 DIAGNOSIS — Z79899 Other long term (current) drug therapy: Secondary | ICD-10-CM

## 2020-01-10 DIAGNOSIS — G894 Chronic pain syndrome: Secondary | ICD-10-CM | POA: Diagnosis not present

## 2020-01-10 DIAGNOSIS — I10 Essential (primary) hypertension: Secondary | ICD-10-CM | POA: Diagnosis not present

## 2020-01-10 DIAGNOSIS — D5700 Hb-SS disease with crisis, unspecified: Secondary | ICD-10-CM | POA: Diagnosis present

## 2020-01-10 DIAGNOSIS — F419 Anxiety disorder, unspecified: Secondary | ICD-10-CM | POA: Diagnosis not present

## 2020-01-10 DIAGNOSIS — D57219 Sickle-cell/Hb-C disease with crisis, unspecified: Principal | ICD-10-CM | POA: Diagnosis present

## 2020-01-10 DIAGNOSIS — Z832 Family history of diseases of the blood and blood-forming organs and certain disorders involving the immune mechanism: Secondary | ICD-10-CM | POA: Diagnosis not present

## 2020-01-10 DIAGNOSIS — Z20822 Contact with and (suspected) exposure to covid-19: Secondary | ICD-10-CM | POA: Diagnosis present

## 2020-01-10 DIAGNOSIS — E559 Vitamin D deficiency, unspecified: Secondary | ICD-10-CM | POA: Diagnosis present

## 2020-01-10 DIAGNOSIS — Z6841 Body Mass Index (BMI) 40.0 and over, adult: Secondary | ICD-10-CM | POA: Diagnosis not present

## 2020-01-10 LAB — COMPREHENSIVE METABOLIC PANEL
ALT: 18 U/L (ref 0–44)
AST: 17 U/L (ref 15–41)
Albumin: 4.4 g/dL (ref 3.5–5.0)
Alkaline Phosphatase: 72 U/L (ref 38–126)
Anion gap: 10 (ref 5–15)
BUN: 11 mg/dL (ref 6–20)
CO2: 24 mmol/L (ref 22–32)
Calcium: 9.8 mg/dL (ref 8.9–10.3)
Chloride: 105 mmol/L (ref 98–111)
Creatinine, Ser: 0.54 mg/dL (ref 0.44–1.00)
GFR, Estimated: >60 mL/min (ref >60)
Glucose, Bld: 120 mg/dL — ABNORMAL HIGH (ref 70–99)
Potassium: 3.9 mmol/L (ref 3.5–5.1)
Sodium: 139 mmol/L (ref 135–145)
Total Bilirubin: 1.4 mg/dL — ABNORMAL HIGH (ref 0.3–1.2)
Total Protein: 7.8 g/dL (ref 6.5–8.1)

## 2020-01-10 LAB — RESP PANEL BY RT-PCR (FLU A&B, COVID) ARPGX2
Influenza A by PCR: NEGATIVE
Influenza B by PCR: NEGATIVE
SARS Coronavirus 2 by RT PCR: NEGATIVE

## 2020-01-10 LAB — CBC WITH DIFFERENTIAL/PLATELET
Abs Immature Granulocytes: 0.02 10*3/uL (ref 0.00–0.07)
Basophils Absolute: 0 10*3/uL (ref 0.0–0.1)
Basophils Relative: 0 %
Eosinophils Absolute: 0.2 10*3/uL (ref 0.0–0.5)
Eosinophils Relative: 2 %
HCT: 36.5 % (ref 36.0–46.0)
Hemoglobin: 12.6 g/dL (ref 12.0–15.0)
Immature Granulocytes: 0 %
Lymphocytes Relative: 26 %
Lymphs Abs: 2.1 10*3/uL (ref 0.7–4.0)
MCH: 26.9 pg (ref 26.0–34.0)
MCHC: 34.5 g/dL (ref 30.0–36.0)
MCV: 77.8 fL — ABNORMAL LOW (ref 80.0–100.0)
Monocytes Absolute: 0.7 10*3/uL (ref 0.1–1.0)
Monocytes Relative: 9 %
Neutro Abs: 4.9 10*3/uL (ref 1.7–7.7)
Neutrophils Relative %: 63 %
Platelets: 297 10*3/uL (ref 150–400)
RBC: 4.69 MIL/uL (ref 3.87–5.11)
RDW: 14.4 % (ref 11.5–15.5)
WBC: 7.8 10*3/uL (ref 4.0–10.5)
nRBC: 0.9 % — ABNORMAL HIGH (ref 0.0–0.2)

## 2020-01-10 LAB — PREGNANCY, URINE: Preg Test, Ur: NEGATIVE

## 2020-01-10 LAB — RETICULOCYTES
Immature Retic Fract: 31.9 % — ABNORMAL HIGH (ref 2.3–15.9)
RBC.: 4.72 MIL/uL (ref 3.87–5.11)
Retic Count, Absolute: 239.3 10*3/uL — ABNORMAL HIGH (ref 19.0–186.0)
Retic Ct Pct: 5.1 % — ABNORMAL HIGH (ref 0.4–3.1)

## 2020-01-10 MED ORDER — DIPHENHYDRAMINE HCL 25 MG PO CAPS
25.0000 mg | ORAL_CAPSULE | ORAL | Status: DC | PRN
  Administered 2020-01-11: 25 mg via ORAL
  Filled 2020-01-10: qty 1

## 2020-01-10 MED ORDER — ACETAMINOPHEN 500 MG PO TABS
1000.0000 mg | ORAL_TABLET | Freq: Once | ORAL | Status: AC
  Administered 2020-01-10: 1000 mg via ORAL
  Filled 2020-01-10: qty 2

## 2020-01-10 MED ORDER — OXYCODONE HCL ER 20 MG PO T12A
20.0000 mg | EXTENDED_RELEASE_TABLET | Freq: Two times a day (BID) | ORAL | Status: DC
  Administered 2020-01-10 – 2020-01-15 (×10): 20 mg via ORAL
  Filled 2020-01-10 (×11): qty 1

## 2020-01-10 MED ORDER — HYDROMORPHONE 1 MG/ML IV SOLN
INTRAVENOUS | Status: DC
  Administered 2020-01-10: 30 mg via INTRAVENOUS
  Administered 2020-01-10: 4.5 mg via INTRAVENOUS
  Administered 2020-01-10: 15 mg via INTRAVENOUS
  Administered 2020-01-10: 30 mg via INTRAVENOUS
  Administered 2020-01-10: 7.5 mg via INTRAVENOUS
  Administered 2020-01-11: 9 mg via INTRAVENOUS
  Administered 2020-01-11: 11.5 mg via INTRAVENOUS
  Administered 2020-01-11: 30 mg via INTRAVENOUS
  Filled 2020-01-10 (×3): qty 30

## 2020-01-10 MED ORDER — VOXELOTOR 500 MG PO TABS: 1500.0000 mg | ORAL_TABLET | Freq: Every day | ORAL | Status: DC

## 2020-01-10 MED ORDER — SODIUM CHLORIDE 0.45 % IV SOLN
INTRAVENOUS | Status: DC
  Administered 2020-01-12: 1 mL via INTRAVENOUS

## 2020-01-10 MED ORDER — POLYETHYLENE GLYCOL 3350 17 G PO PACK: 17.0000 g | PACK | Freq: Every day | ORAL | Status: DC | PRN

## 2020-01-10 MED ORDER — SODIUM CHLORIDE 0.9% FLUSH: 9.0000 mL | INTRAVENOUS | Status: DC | PRN

## 2020-01-10 MED ORDER — SODIUM CHLORIDE 0.9% FLUSH: 10.0000 mL | INTRAVENOUS | Status: DC | PRN

## 2020-01-10 MED ORDER — SENNOSIDES-DOCUSATE SODIUM 8.6-50 MG PO TABS
1.0000 | ORAL_TABLET | Freq: Two times a day (BID) | ORAL | Status: DC
  Administered 2020-01-11 – 2020-01-15 (×9): 1 via ORAL
  Filled 2020-01-10 (×10): qty 1

## 2020-01-10 MED ORDER — ONDANSETRON HCL 4 MG/2ML IJ SOLN
4.0000 mg | Freq: Four times a day (QID) | INTRAMUSCULAR | Status: DC | PRN
  Administered 2020-01-10: 4 mg via INTRAVENOUS
  Filled 2020-01-10: qty 2

## 2020-01-10 MED ORDER — KETOROLAC TROMETHAMINE 30 MG/ML IJ SOLN
15.0000 mg | Freq: Once | INTRAMUSCULAR | Status: AC
  Administered 2020-01-10: 15 mg via INTRAVENOUS
  Filled 2020-01-10: qty 1

## 2020-01-10 MED ORDER — NALOXONE HCL 0.4 MG/ML IJ SOLN: 0.4000 mg | INTRAMUSCULAR | Status: DC | PRN

## 2020-01-10 MED ORDER — KETOROLAC TROMETHAMINE 15 MG/ML IJ SOLN
15.0000 mg | Freq: Four times a day (QID) | INTRAMUSCULAR | Status: AC
  Administered 2020-01-10 – 2020-01-15 (×20): 15 mg via INTRAVENOUS
  Filled 2020-01-10 (×20): qty 1

## 2020-01-10 MED ORDER — METOPROLOL TARTRATE 25 MG PO TABS
12.5000 mg | ORAL_TABLET | Freq: Two times a day (BID) | ORAL | Status: DC
  Administered 2020-01-10 – 2020-01-15 (×10): 12.5 mg via ORAL
  Filled 2020-01-10 (×10): qty 1

## 2020-01-10 MED ORDER — ENOXAPARIN SODIUM 40 MG/0.4ML ~~LOC~~ SOLN
40.0000 mg | SUBCUTANEOUS | Status: DC
  Administered 2020-01-10 – 2020-01-14 (×5): 40 mg via SUBCUTANEOUS
  Filled 2020-01-10 (×5): qty 0.4

## 2020-01-10 MED ORDER — FOLIC ACID 1 MG PO TABS
1.0000 mg | ORAL_TABLET | Freq: Every day | ORAL | Status: DC
  Administered 2020-01-11 – 2020-01-15 (×5): 1 mg via ORAL
  Filled 2020-01-10 (×5): qty 1

## 2020-01-10 NOTE — Telephone Encounter (Signed)
Patient called requesting to come to the day hospital for sickle cell pain. Patient reports bilateral leg and left arm pain rated 10/10. Reports taking Oxycodone 20 mg at 2:00 am. COVID-19 screening done and patient denies all symptoms and exposures. Denies fever, chest pain, nausea, vomiting, diarrhea and abdominal pain. [place], FNP notified. Patient can come to the day hospital for pain management. Patient advised and expresses an understanding.

## 2020-01-10 NOTE — Discharge Instructions (Signed)
Sickle Cell Anemia, Adult  Sickle cell anemia is a condition where your red blood cells are shaped like sickles. Red blood cells carry oxygen through the body. Sickle-shaped cells do not live as long as normal red blood cells. They also clump together and block blood from flowing through the blood vessels. This prevents the body from getting enough oxygen. Sickle cell anemia causes organ damage and pain. It also increases the risk of infection. Follow these instructions at home: Medicines  Take over-the-counter and prescription medicines only as told by your doctor.  If you were prescribed an antibiotic medicine, take it as told by your doctor. Do not stop taking the antibiotic even if you start to feel better.  If you develop a fever, do not take medicines to lower the fever right away. Tell your doctor about the fever. Managing pain, stiffness, and swelling  Try these methods to help with pain: ? Use a heating pad. ? Take a warm bath. ? Distract yourself, such as by watching TV. Eating and drinking  Drink enough fluid to keep your pee (urine) clear or pale yellow. Drink more in hot weather and during exercise.  Limit or avoid alcohol.  Eat a healthy diet. Eat plenty of fruits, vegetables, whole grains, and lean protein.  Take vitamins and supplements as told by your doctor. Traveling  When traveling, keep these with you: ? Your medical information. ? The names of your doctors. ? Your medicines.  If you need to take an airplane, talk to your doctor first. Activity  Rest often.  Avoid exercises that make your heart beat much faster, such as jogging. General instructions  Do not use products that have nicotine or tobacco, such as cigarettes and e-cigarettes. If you need help quitting, ask your doctor.  Consider wearing a medical alert bracelet.  Avoid being in high places (high altitudes), such as mountains.  Avoid very hot or cold temperatures.  Avoid places where the  temperature changes a lot.  Keep all follow-up visits as told by your doctor. This is important. Contact a doctor if:  A joint hurts.  Your feet or hands hurt or swell.  You feel tired (fatigued). Get help right away if:  You have symptoms of infection. These include: ? Fever. ? Chills. ? Being very tired. ? Irritability. ? Poor eating. ? Throwing up (vomiting).  You feel dizzy or faint.  You have new stomach pain, especially on the left side.  You have a an erection (priapism) that lasts more than 4 hours.  You have numbness in your arms or legs.  You have a hard time moving your arms or legs.  You have trouble talking.  You have pain that does not go away when you take medicine.  You are short of breath.  You are breathing fast.  You have a long-term cough.  You have pain in your chest.  You have a bad headache.  You have a stiff neck.  Your stomach looks bloated even though you did not eat much.  Your skin is pale.  You suddenly cannot see well. Summary  Sickle cell anemia is a condition where your red blood cells are shaped like sickles.  Follow your doctor's advice on ways to manage pain, food to eat, activities to do, and steps to take for safe travel.  Get medical help right away if you have any signs of infection, such as a fever. This information is not intended to replace advice given to you by   your health care provider. Make sure you discuss any questions you have with your health care provider. Document Revised: 05/14/2018 Document Reviewed: 02/26/2016 Elsevier Patient Education  2020 [employer].  

## 2020-01-10 NOTE — Telephone Encounter (Signed)
error 

## 2020-01-10 NOTE — Progress Notes (Signed)
Patient admitted to the day hospital for treatment of sickle cell pain crisis. Patient reported pain rated 9/10 in the legs and left arm. Patient placed on Dilaudid PCA, given PO tylenol, IV Toradol and hydrated with IV fluids. Patient transferred to [facility] 6E room 21. Report was given to [facility]. Reported pain on transfer was 8/10. Alert, oriented and transported in a wheelchair.Marland Kitchen

## 2020-01-10 NOTE — H&P (Signed)
H&P  Patient Demographics:  Stephanie Burch, is a 39 y.o. female  MRN: [id number]   DOB - [birth year]  Admit Date - 01/10/2020  Outpatient Primary MD for the patient is Kallie Locks, FNP     HPI:   Yavonne Kiss  is a 39 y.o. female with a medical history significant for sickle cell disease, chronic pain syndrome, opiate dependence and tolerance, generalized anxiety, morbid obesity, essential hypertension, and history of anemia of chronic disease presented to sickle cell day clinic with complaints of low back, upper and lower extremity pain that is consistent with her typical sickle cell pain crisis.  Patient states that pain intensity increased several days ago and has been unrelieved by home medications.  She has not identified any palliative or provocative factors concerning current crisis.  She characterizes her pain as constant, throbbing, and occasionally sharp.  She says she is unable to find a comfortable position.  Patient rates pain as 10/10.  She last had both oxycodone and OxyContin this a.m. without sustained relief.  She denies any headache, subjective fever, chills, headache, or shortness of breath.  No chest pain, dizziness, urinary symptoms, nausea, vomiting, or diarrhea.  No sick contacts, recent travel, or exposure to COVID-19.  Sickle cell day infusion center course: Vital signs show:BP (!) 118/58 (BP Location: Right Arm)   Pulse (!) 110   Temp 98.2 F (36.8 C) (Temporal)   Resp 20   LMP 11/11/2019   SpO2 96% Patient tachycardic, pulse 110.  Reviewed all labs, largely within normal limits.  Improved from patient's baseline.  COVID-19 test pending.  Patient's pain persists despite IV Dilaudid PCA, IV fluids, IV Toradol, and Tylenol.  Patient admitted to MedSurg for further management of sickle cell pain crisis.    Review of systems:  In addition to the HPI above, patient reports No fever or chills No Headache, No changes with vision or hearing No problems swallowing  food or liquids No chest pain, cough or shortness of breath No abdominal pain, No nausea or vomiting, Bowel movements are regular No blood in stool or urine No dysuria No new skin rashes or bruises No new joints pains-aches No new weakness, tingling, numbness in any extremity No recent weight gain or loss No polyuria, polydypsia or polyphagia No significant Mental Stressors   With Past History of the following :   Past Medical History:  Diagnosis Date  . HSV infection   . Nausea   . Sickle cell anemia (HCC)   . Vitamin B12 deficiency 12/2018  . Vitamin D deficiency       History reviewed. No pertinent surgical history.   Social History:   Social History   Tobacco Use  . Smoking status: Never Smoker  . Smokeless tobacco: Never Used  Substance Use Topics  . Alcohol use: Not Currently     Lives - At home   Family History :   Family History  Problem Relation Age of Onset  . Hypertension Mother   . Sickle cell trait Mother   . Diabetes Father   . Sickle cell trait Father      Home Medications:   Prior to Admission medications   Medication Sig Start Date End Date Taking? Authorizing Provider  ibuprofen (ADVIL) 800 MG tablet Take 1 tablet (800 mg total) by mouth every 8 (eight) hours as needed for headache, mild pain, moderate pain or cramping. 08/15/19  Yes Kallie Locks, FNP  oxyCODONE ER [facility]) 18 MG C12A Take 18  mg by mouth every 12 (twelve) hours. 12/27/19  Yes Kallie Locks, FNP  Oxycodone HCl 20 MG TABS Take 1 tablet (20 mg total) by mouth every 4 (four) hours as needed (for pain). 01/04/20  Yes Kallie Locks, FNP  folic acid (FOLVITE) 1 MG tablet Take 1 tablet (1 mg total) by mouth daily. 11/15/18   Kallie Locks, FNP  naloxone [facility]) 0.4 MG/ML injection As needed Patient taking differently: Inject 0.4 mg into the skin daily as needed (overdose).  09/14/18   Kallie Locks, FNP  ondansetron (ZOFRAN ODT) 4 MG disintegrating tablet  Take 1 tablet (4 mg total) by mouth every 8 (eight) hours as needed for nausea or vomiting. 11/28/19   Venter, Margaux, PA-C  ondansetron (ZOFRAN) 4 MG tablet Take 4 mg by mouth every 8 (eight) hours as needed for nausea or vomiting.  11/25/19   [provider]  tiZANidine (ZANAFLEX) 4 MG capsule Take 1 capsule (4 mg total) by mouth 3 (three) times daily as needed for muscle spasms. 10/07/19 05/04/20  Kallie Locks, FNP  valACYclovir (VALTREX) 500 MG tablet Take Valtrex 500 mg daily for suppression of outbreaks. Take Valtrex 1000 mg daily X 1 week for outbreaks. Patient taking differently: Take 500 mg by mouth daily as needed (for breakouts).  10/20/19   Kallie Locks, FNP  Vitamin D, Ergocalciferol, (DRISDOL) 1.25 MG (50000 UT) CAPS capsule Take 1 capsule (50,000 Units total) by mouth every 7 (seven) days. 12/21/18   Kallie Locks, FNP  voxelotor (OXBRYTA) 500 MG TABS tablet Take 1,500 mg by mouth daily.     [provider]     Allergies:   No Known Allergies   Physical Exam:   Vitals:   Vitals:   01/10/20 1410 01/10/20 1556  BP: 135/80 (!) 118/58  Pulse: (!) 104 (!) 110  Resp: 20 20  Temp:    SpO2: 99% 96%    Physical Exam: Constitutional: Patient appears well-developed and well-nourished. Not in obvious distress. HENT: Normocephalic, atraumatic, External right and left ear normal. Oropharynx is clear and moist.  Eyes: Conjunctivae and EOM are normal. PERRLA, no scleral icterus. Neck: Normal ROM. Neck supple. No JVD. No tracheal deviation. No thyromegaly. CVS: RRR, S1/S2 +, no murmurs, no gallops, no carotid bruit.  Pulmonary: Effort and breath sounds normal, no stridor, rhonchi, wheezes, rales.  Abdominal: Soft. BS +, no distension, tenderness, rebound or guarding.  Musculoskeletal: Normal range of motion. No edema and no tenderness.  Lymphadenopathy: No lymphadenopathy noted, cervical, inguinal or axillary Neuro: Alert. Normal reflexes, muscle tone  coordination. No cranial nerve deficit. Skin: Skin is warm and dry. No rash noted. Not diaphoretic. No erythema. No pallor. Psychiatric: Normal mood and affect. Behavior, judgment, thought content normal.   Data Review:   CBC Recent Labs  Lab 01/10/20 1010  WBC 7.8  HGB 12.6  HCT 36.5  PLT 297  MCV 77.8*  MCH 26.9  MCHC 34.5  RDW 14.4  LYMPHSABS 2.1  MONOABS 0.7  EOSABS 0.2  BASOSABS 0.0   ------------------------------------------------------------------------------------------------------------------  Chemistries  Recent Labs  Lab 01/10/20 1010  NA 139  K 3.9  CL 105  CO2 24  GLUCOSE 120*  BUN 11  CREATININE 0.54  CALCIUM 9.8  AST 17  ALT 18  ALKPHOS 72  BILITOT 1.4*   ------------------------------------------------------------------------------------------------------------------ estimated creatinine clearance is 135.4 mL/min (by C-G formula based on SCr of 0.54 mg/dL). ------------------------------------------------------------------------------------------------------------------ No results for input(s): TSH, T4TOTAL, T3FREE, THYROIDAB in the  last 72 hours.  Invalid input(s): FREET3  Coagulation profile No results for input(s): INR, PROTIME in the last 168 hours. ------------------------------------------------------------------------------------------------------------------- No results for input(s): DDIMER in the last 72 hours. -------------------------------------------------------------------------------------------------------------------  Cardiac Enzymes No results for input(s): CKMB, TROPONINI, MYOGLOBIN in the last 168 hours.  Invalid input(s): CK ------------------------------------------------------------------------------------------------------------------ No results found for: BNP  ---------------------------------------------------------------------------------------------------------------  Urinalysis    Component Value Date/Time    COLORURINE YELLOW 11/28/2019 1443   APPEARANCEUR CLEAR 11/28/2019 1443   LABSPEC 1.017 11/28/2019 1443   PHURINE 7.0 11/28/2019 1443   GLUCOSEU NEGATIVE 11/28/2019 1443   HGBUR NEGATIVE 11/28/2019 1443   BILIRUBINUR NEGATIVE 11/28/2019 1443   BILIRUBINUR Negative 04/27/2019 0935   KETONESUR NEGATIVE 11/28/2019 1443   PROTEINUR NEGATIVE 11/28/2019 1443   UROBILINOGEN 1.0 04/27/2019 0935   NITRITE NEGATIVE 11/28/2019 1443   LEUKOCYTESUR NEGATIVE 11/28/2019 1443    ----------------------------------------------------------------------------------------------------------------   Imaging Results:    No results found.   Assessment & Plan:  Principal Problem:   Sickle cell pain crisis (HCC) Active Problems:   Morbidly obese (HCC)   Chronic pain syndrome   Anxiety   Essential hypertension  Sickle cell disease with pain crisis: Patient's pain persists despite pain management and extensive observation at the sickle cell day clinic.  Admit to MedSurg, continue IV fluids, 0.45% saline at 100 mL/h. IV Dilaudid PCA with settings of 0.5 mg, 10-minute lockout, and 3 mg/h. Toradol 15 mg IV every 6 hours for total of 5 days Continue home medications, OxyContin 20 mg every 12 hours.  Hold oxycodone, use PCA Dilaudid. Monitor vital signs very closely, reevaluate pain scale regularly, and supplemental oxygen as needed.  Sickle cell anemia: Patient's hemoglobin is 12.6, which is improved from her baseline.  There is no clinical indication for blood transfusion at this time.  Continue folic acid 1 mg daily and Oxbryta.  Chronic pain syndrome: Continue home medications  Morbid obesity: Patient counseled extensively on the importance of following a low-fat, low carbohydrate diet. Heart healthy diet  :    DVT Prophylaxis: Subcut Lovenox and SCDs  AM Labs Ordered, also please review Full Orders  Family Communication: Admission, patient's condition and plan of care including tests being  ordered have been discussed with the patient who indicate understanding and agree with the plan and Code Status.  Code Status: Full Code  Consults called: None    Admission status: Inpatient    Time spent in minutes : 35 minutes    Nolon Nations  APRN, MSN, FNP-C Patient Care [facility] [address] [place], [place] [address] [phone]  01/10/2020 at 4:06 PM

## 2020-01-11 ENCOUNTER — Other Ambulatory Visit: Payer: Self-pay | Admitting: Family Medicine

## 2020-01-11 ENCOUNTER — Telehealth: Payer: Self-pay | Admitting: Family Medicine

## 2020-01-11 DIAGNOSIS — D571 Sickle-cell disease without crisis: Secondary | ICD-10-CM

## 2020-01-11 DIAGNOSIS — D5700 Hb-SS disease with crisis, unspecified: Secondary | ICD-10-CM | POA: Diagnosis not present

## 2020-01-11 LAB — CBC
HCT: 30.3 % — ABNORMAL LOW (ref 36.0–46.0)
Hemoglobin: 10.3 g/dL — ABNORMAL LOW (ref 12.0–15.0)
MCH: 26.7 pg (ref 26.0–34.0)
MCHC: 34 g/dL (ref 30.0–36.0)
MCV: 78.5 fL — ABNORMAL LOW (ref 80.0–100.0)
Platelets: 252 10*3/uL (ref 150–400)
RBC: 3.86 MIL/uL — ABNORMAL LOW (ref 3.87–5.11)
RDW: 14.3 % (ref 11.5–15.5)
WBC: 12.1 10*3/uL — ABNORMAL HIGH (ref 4.0–10.5)
nRBC: 0.9 % — ABNORMAL HIGH (ref 0.0–0.2)

## 2020-01-11 MED ORDER — HYDROMORPHONE 1 MG/ML IV SOLN
INTRAVENOUS | Status: DC
  Administered 2020-01-11: 7 mg via INTRAVENOUS
  Administered 2020-01-11: 12 mg via INTRAVENOUS
  Administered 2020-01-12: 5.5 mg via INTRAVENOUS
  Administered 2020-01-12: 4 mg via INTRAVENOUS
  Administered 2020-01-12: 30 mg via INTRAVENOUS
  Administered 2020-01-12: 6.5 mg via INTRAVENOUS
  Filled 2020-01-11 (×2): qty 30

## 2020-01-11 MED ORDER — OXBRYTA 500 MG PO TABS: 1500.0000 mg | ORAL_TABLET | Freq: Every day | ORAL | 11 refills | Status: DC

## 2020-01-11 MED ORDER — OXBRYTA 500 MG PO TABS
1500.0000 mg | ORAL_TABLET | Freq: Every day | ORAL | 11 refills | Status: DC
Start: 2020-01-11 — End: 2020-01-11

## 2020-01-11 MED ORDER — OXYCODONE HCL 5 MG PO TABS
20.0000 mg | ORAL_TABLET | ORAL | Status: DC | PRN
  Administered 2020-01-11 – 2020-01-15 (×19): 20 mg via ORAL
  Filled 2020-01-11 (×19): qty 4

## 2020-01-11 NOTE — Telephone Encounter (Signed)
Done

## 2020-01-11 NOTE — Telephone Encounter (Signed)
We have not received calls from [facility] for this patient, however, Rx refill sent to [facility] today. 1 year refills.

## 2020-01-11 NOTE — Progress Notes (Signed)
Subjective: Stephanie Burch is a 39 year old female with a medical history significant for sickle cell disease type [place], chronic pain syndrome, opiate dependence and tolerance, morbid obesity, history of depression and anxiety, and history of anemia of chronic disease was admitted for sickle cell pain crisis.  Patient says that pain is improved minimally overnight.  Pain continues to be primarily to low back and lower extremities.  Patient has been ambulating without assistance.  She denies any headache, chest pain, urinary symptoms, nausea, vomiting, or diarrhea.  Objective:  Vital signs in last 24 hours:  Vitals:   01/11/20 1710 01/11/20 1804 01/11/20 2050 01/11/20 2103  BP:  128/76  (!) 111/48  Pulse:  95  (!) 106  Resp: 14 15 15 16   Temp:  98.4 F (36.9 C)  97.9 F (36.6 C)  TempSrc:  Oral  Oral  SpO2: 97% 97% 96% 97%  Weight:      Height:        Intake/Output from previous day:   Intake/Output Summary (Last 24 hours) at 01/11/2020 2104 Last data filed at 01/11/2020 1510 Gross per 24 hour  Intake 2138.68 ml  Output --  Net 2138.68 ml    Physical Exam: General: Alert, awake, oriented x3, in no acute distress.  HEENT: [place]/AT PEERL, EOMI Neck: Trachea midline,  no masses, no thyromegal,y no JVD, no carotid bruit OROPHARYNX:  Moist, No exudate/ erythema/lesions.  Heart: Regular rate and rhythm, without murmurs, rubs, gallops, PMI non-displaced, no heaves or thrills on palpation.  Lungs: Clear to auscultation, no wheezing or rhonchi noted. No increased vocal fremitus resonant to percussion  Abdomen: Soft, nontender, nondistended, positive bowel sounds, no masses no hepatosplenomegaly noted..  Neuro: No focal neurological deficits noted cranial nerves II through XII grossly intact. DTRs 2+ bilaterally upper and lower extremities. Strength 5 out of 5 in bilateral upper and lower extremities. Musculoskeletal: No warm swelling or erythema around joints, no spinal tenderness  noted. Psychiatric: Patient alert and oriented x3, good insight and cognition, good recent to remote recall. Lymph node survey: No cervical axillary or inguinal lymphadenopathy noted.  Lab Results:  Basic Metabolic Panel:    Component Value Date/Time   NA 139 01/10/2020 1010   NA 142 09/11/2017 1431   K 3.9 01/10/2020 1010   CL 105 01/10/2020 1010   CO2 24 01/10/2020 1010   BUN 11 01/10/2020 1010   BUN 8 09/11/2017 1431   CREATININE 0.54 01/10/2020 1010   GLUCOSE 120 (H) 01/10/2020 1010   CALCIUM 9.8 01/10/2020 1010   CBC:    Component Value Date/Time   WBC 12.1 (H) 01/11/2020 0848   HGB 10.3 (L) 01/11/2020 0848   HGB 13.0 09/11/2017 1431   HCT 30.3 (L) 01/11/2020 0848   HCT 38.4 09/11/2017 1431   PLT 252 01/11/2020 0848   PLT 258 09/11/2017 1431   MCV 78.5 (L) 01/11/2020 0848   MCV 75 (L) 09/11/2017 1431   NEUTROABS 4.9 01/10/2020 1010   NEUTROABS 3.9 09/11/2017 1431   LYMPHSABS 2.1 01/10/2020 1010   LYMPHSABS 2.2 09/11/2017 1431   MONOABS 0.7 01/10/2020 1010   EOSABS 0.2 01/10/2020 1010   EOSABS 0.1 09/11/2017 1431   BASOSABS 0.0 01/10/2020 1010   BASOSABS 0.0 09/11/2017 1431    Recent Results (from the past 240 hour(s))  Resp Panel by RT-PCR (Flu A&B, Covid) Nasopharyngeal Swab     Status: None   Collection Time: 01/10/20  5:30 PM   Specimen: Nasopharyngeal Swab; Nasopharyngeal(NP) swabs in vial transport medium  Result Value Ref Range Status   SARS Coronavirus 2 by RT PCR NEGATIVE NEGATIVE Final    Comment: (NOTE) SARS-CoV-2 target nucleic acids are NOT DETECTED.  The SARS-CoV-2 RNA is generally detectable in upper respiratory specimens during the acute phase of infection. The lowest concentration of SARS-CoV-2 viral copies this assay can detect is 138 copies/mL. A negative result does not preclude SARS-Cov-2 infection and should not be used as the sole basis for treatment or other patient management decisions. A negative result may occur with  improper  specimen collection/handling, submission of specimen other than nasopharyngeal swab, presence of viral mutation(s) within the areas targeted by this assay, and inadequate number of viral copies(<138 copies/mL). A negative result must be combined with clinical observations, patient history, and epidemiological information. The expected result is Negative.  Fact Sheet for Patients:  BloggerCourse.com  Fact Sheet for Healthcare Providers:  SeriousBroker.it  This test is no t yet approved or cleared by the [place] FDA and  has been authorized for detection and/or diagnosis of SARS-CoV-2 by FDA under an Emergency Use Authorization (EUA). This EUA will remain  in effect (meaning this test can be used) for the duration of the COVID-19 declaration under Section 564(b)(1) of the Act, 21 U.S.C.section 360bbb-3(b)(1), unless the authorization is terminated  or revoked sooner.       Influenza A by PCR NEGATIVE NEGATIVE Final   Influenza B by PCR NEGATIVE NEGATIVE Final    Comment: (NOTE) The Xpert Xpress SARS-CoV-2/FLU/RSV plus assay is intended as an aid in the diagnosis of influenza from Nasopharyngeal swab specimens and should not be used as a sole basis for treatment. Nasal washings and aspirates are unacceptable for Xpert Xpress SARS-CoV-2/FLU/RSV testing.  Fact Sheet for Patients: BloggerCourse.com  Fact Sheet for Healthcare Providers: SeriousBroker.it  This test is not yet approved or cleared by the [place] FDA and has been authorized for detection and/or diagnosis of SARS-CoV-2 by FDA under an Emergency Use Authorization (EUA). This EUA will remain in effect (meaning this test can be used) for the duration of the COVID-19 declaration under Section 564(b)(1) of the Act, 21 U.S.C. section 360bbb-3(b)(1), unless the authorization is terminated or revoked.  Performed at  [facility], [address] [place], [place] [address]     Studies/Results: No results found.  Medications: Scheduled Meds: . enoxaparin (LOVENOX) injection  40 mg Subcutaneous Q24H  . folic acid  1 mg Oral Daily  . HYDROmorphone   Intravenous Q4H  . ketorolac  15 mg Intravenous Q6H  . metoprolol tartrate  12.5 mg Oral BID  . oxyCODONE  20 mg Oral Q12H  . senna-docusate  1 tablet Oral BID  . voxelotor  1,500 mg Oral Daily   Continuous Infusions: . sodium chloride 10 mL/hr at 01/11/20 1343   PRN Meds:.diphenhydrAMINE, naloxone **AND** sodium chloride flush, ondansetron (ZOFRAN) IV, oxyCODONE, polyethylene glycol, sodium chloride flush  Assessment/Plan: Principal Problem:   Sickle cell pain crisis (HCC) Active Problems:   Morbidly obese (HCC)   Chronic pain syndrome  Sickle cell disease with pain crisis: Decrease IV fluids to [facility] Decrease settings of IV Dilaudid PCA, weaning to 0.5 mg, 10-minute lockout, and 2 mg/h. Initiate oxycodone 20 mg every 4 hours as needed for severe breakthrough pain Toradol 15 mg IV every 6 hours for total of 5 days Monitor vital signs very closely, reevaluate pain scale regularly, and supplemental oxygen as needed.  Sickle cell anemia: Patient's hemoglobin is stable and consistent with her baseline.  There is no clinical occasion for blood transfusion at this time.  Continue to follow closely.  Chronic pain syndrome: Continue home medications  Morbid obesity: Patient counseled at length about diet and exercise regimen on admission.  Continue heart healthy diet.     Code Status: Full Code Family Communication: N/A Disposition Plan: Not yet ready for discharge  Harlie Buening Rennis Petty  APRN, MSN, FNP-C [facility] [address] [place], [place] [address] [phone]  If 7PM-7AM, please contact night-coverage.  01/11/2020, 9:04 PM  LOS: 1 day

## 2020-01-12 DIAGNOSIS — D5700 Hb-SS disease with crisis, unspecified: Secondary | ICD-10-CM | POA: Diagnosis not present

## 2020-01-12 MED ORDER — HYDROMORPHONE 1 MG/ML IV SOLN
INTRAVENOUS | Status: DC
  Administered 2020-01-12: 3 mg via INTRAVENOUS
  Administered 2020-01-12: 6.5 mg via INTRAVENOUS
  Administered 2020-01-13: 5.5 mg via INTRAVENOUS
  Administered 2020-01-13: 30 mg via INTRAVENOUS
  Administered 2020-01-13: 1 mg via INTRAVENOUS
  Administered 2020-01-13: 2 mg via INTRAVENOUS
  Filled 2020-01-12: qty 30

## 2020-01-12 NOTE — Progress Notes (Signed)
Subjective: Stephanie Burch is a 39 year old female with a medical history significant for sickle cell disease, chronic pain syndrome, opiate dependence and tolerance, morbid obesity, history of depression and anxiety, and history of anemia of chronic disease was admitted for sickle cell pain crisis. Patient says the pain has improved some.  Pain continues to be primarily to low back and lower extremities.  Patient has been ambulating around room without assistance.  She rates pain as 6/10.  She denies any headache, chest pain, shortness of breath, urinary symptoms, nausea, vomiting, or diarrhea.  Objective:  Vital signs in last 24 hours:  Vitals:   01/12/20 1010 01/12/20 1100 01/12/20 1443 01/12/20 1642  BP: 125/65 124/80 127/77   Pulse: 100 (!) 106 84   Resp: 17  14   Temp: 98.2 F (36.8 C)  97.8 F (36.6 C)   TempSrc: Oral  Oral   SpO2: 99%  98% 99%  Weight:      Height:        Intake/Output from previous day:   Intake/Output Summary (Last 24 hours) at 01/12/2020 1704 Last data filed at 01/12/2020 1033 Gross per 24 hour  Intake 360 ml  Output --  Net 360 ml    Physical Exam: General: Alert, awake, oriented x3, in no acute distress.  HEENT: [place]/AT PEERL, EOMI Neck: Trachea midline,  no masses, no thyromegal,y no JVD, no carotid bruit OROPHARYNX:  Moist, No exudate/ erythema/lesions.  Heart: Regular rate and rhythm, without murmurs, rubs, gallops, PMI non-displaced, no heaves or thrills on palpation.  Lungs: Clear to auscultation, no wheezing or rhonchi noted. No increased vocal fremitus resonant to percussion  Abdomen: Soft, nontender, nondistended, positive bowel sounds, no masses no hepatosplenomegaly noted..  Neuro: No focal neurological deficits noted cranial nerves II through XII grossly intact. DTRs 2+ bilaterally upper and lower extremities. Strength 5 out of 5 in bilateral upper and lower extremities. Musculoskeletal: No warm swelling or erythema around joints, no  spinal tenderness noted. Psychiatric: Patient alert and oriented x3, good insight and cognition, good recent to remote recall. Lymph node survey: No cervical axillary or inguinal lymphadenopathy noted.  Lab Results:  Basic Metabolic Panel:    Component Value Date/Time   NA 139 01/10/2020 1010   NA 142 09/11/2017 1431   K 3.9 01/10/2020 1010   CL 105 01/10/2020 1010   CO2 24 01/10/2020 1010   BUN 11 01/10/2020 1010   BUN 8 09/11/2017 1431   CREATININE 0.54 01/10/2020 1010   GLUCOSE 120 (H) 01/10/2020 1010   CALCIUM 9.8 01/10/2020 1010   CBC:    Component Value Date/Time   WBC 12.1 (H) 01/11/2020 0848   HGB 10.3 (L) 01/11/2020 0848   HGB 13.0 09/11/2017 1431   HCT 30.3 (L) 01/11/2020 0848   HCT 38.4 09/11/2017 1431   PLT 252 01/11/2020 0848   PLT 258 09/11/2017 1431   MCV 78.5 (L) 01/11/2020 0848   MCV 75 (L) 09/11/2017 1431   NEUTROABS 4.9 01/10/2020 1010   NEUTROABS 3.9 09/11/2017 1431   LYMPHSABS 2.1 01/10/2020 1010   LYMPHSABS 2.2 09/11/2017 1431   MONOABS 0.7 01/10/2020 1010   EOSABS 0.2 01/10/2020 1010   EOSABS 0.1 09/11/2017 1431   BASOSABS 0.0 01/10/2020 1010   BASOSABS 0.0 09/11/2017 1431    Recent Results (from the past 240 hour(s))  Resp Panel by RT-PCR (Flu A&B, Covid) Nasopharyngeal Swab     Status: None   Collection Time: 01/10/20  5:30 PM   Specimen: Nasopharyngeal  Swab; Nasopharyngeal(NP) swabs in vial transport medium  Result Value Ref Range Status   SARS Coronavirus 2 by RT PCR NEGATIVE NEGATIVE Final    Comment: (NOTE) SARS-CoV-2 target nucleic acids are NOT DETECTED.  The SARS-CoV-2 RNA is generally detectable in upper respiratory specimens during the acute phase of infection. The lowest concentration of SARS-CoV-2 viral copies this assay can detect is 138 copies/mL. A negative result does not preclude SARS-Cov-2 infection and should not be used as the sole basis for treatment or other patient management decisions. A negative result may occur  with  improper specimen collection/handling, submission of specimen other than nasopharyngeal swab, presence of viral mutation(s) within the areas targeted by this assay, and inadequate number of viral copies(<138 copies/mL). A negative result must be combined with clinical observations, patient history, and epidemiological information. The expected result is Negative.  Fact Sheet for Patients:  BloggerCourse.com  Fact Sheet for Healthcare Providers:  SeriousBroker.it  This test is no t yet approved or cleared by the [place] FDA and  has been authorized for detection and/or diagnosis of SARS-CoV-2 by FDA under an Emergency Use Authorization (EUA). This EUA will remain  in effect (meaning this test can be used) for the duration of the COVID-19 declaration under Section 564(b)(1) of the Act, 21 U.S.C.section 360bbb-3(b)(1), unless the authorization is terminated  or revoked sooner.       Influenza A by PCR NEGATIVE NEGATIVE Final   Influenza B by PCR NEGATIVE NEGATIVE Final    Comment: (NOTE) The Xpert Xpress SARS-CoV-2/FLU/RSV plus assay is intended as an aid in the diagnosis of influenza from Nasopharyngeal swab specimens and should not be used as a sole basis for treatment. Nasal washings and aspirates are unacceptable for Xpert Xpress SARS-CoV-2/FLU/RSV testing.  Fact Sheet for Patients: BloggerCourse.com  Fact Sheet for Healthcare Providers: SeriousBroker.it  This test is not yet approved or cleared by the [place] FDA and has been authorized for detection and/or diagnosis of SARS-CoV-2 by FDA under an Emergency Use Authorization (EUA). This EUA will remain in effect (meaning this test can be used) for the duration of the COVID-19 declaration under Section 564(b)(1) of the Act, 21 U.S.C. section 360bbb-3(b)(1), unless the authorization is terminated  or revoked.  Performed at [facility], [address] [place], [place] [address]     Studies/Results: No results found.  Medications: Scheduled Meds: . enoxaparin (LOVENOX) injection  40 mg Subcutaneous Q24H  . folic acid  1 mg Oral Daily  . HYDROmorphone   Intravenous Q4H  . ketorolac  15 mg Intravenous Q6H  . metoprolol tartrate  12.5 mg Oral BID  . oxyCODONE  20 mg Oral Q12H  . senna-docusate  1 tablet Oral BID  . voxelotor  1,500 mg Oral Daily   Continuous Infusions: . sodium chloride 10 mL/hr at 01/11/20 1343   PRN Meds:.diphenhydrAMINE, naloxone **AND** sodium chloride flush, ondansetron (ZOFRAN) IV, oxyCODONE, polyethylene glycol, sodium chloride flush  Consultants:  None  Procedures:  None  Antibiotics:  None  Assessment/Plan: Principal Problem:   Sickle cell pain crisis (HCC) Active Problems:   Morbidly obese (HCC)   Chronic pain syndrome  Sickle cell disease with pain crisis: Continue IV fluids to KVO.  Weaning IV Dilaudid PCA, settings decreased. Oxycodone 20 mg every 4 hours as needed for severe breakthrough pain Toradol 15 mg IV every 6 hours for total of 5 days. Monitor vital signs very closely, reevaluate pain scale regularly, and supplemental oxygen as needed.  Discharge  plan for 01/13/2020  Sickle cell anemia: Patient's hemoglobin is stable and consistent with baseline.  There is no clinical indication for blood transfusion at this time.  Repeat CBC in a.m.  Chronic pain syndrome: Continue home medications  Morbid obesity: Patient counseled on admission.  Continue heart healthy diet.  Code Status: Full Code Family Communication: N/A Disposition Plan: Not yet ready for discharge  Ianmichael Amescua Rennis Petty  APRN, MSN, FNP-C [facility] [address] [place], [place] [address] [phone]  If 5PM-8AM, please contact night-coverage.  01/12/2020, 5:04 PM  LOS: 2 days

## 2020-01-13 DIAGNOSIS — D5700 Hb-SS disease with crisis, unspecified: Secondary | ICD-10-CM | POA: Diagnosis not present

## 2020-01-13 LAB — CBC
HCT: 29 % — ABNORMAL LOW (ref 36.0–46.0)
Hemoglobin: 10 g/dL — ABNORMAL LOW (ref 12.0–15.0)
MCH: 26.8 pg (ref 26.0–34.0)
MCHC: 34.5 g/dL (ref 30.0–36.0)
MCV: 77.7 fL — ABNORMAL LOW (ref 80.0–100.0)
Platelets: 268 10*3/uL (ref 150–400)
RBC: 3.73 MIL/uL — ABNORMAL LOW (ref 3.87–5.11)
RDW: 14.1 % (ref 11.5–15.5)
WBC: 8.9 10*3/uL (ref 4.0–10.5)
nRBC: 3.8 % — ABNORMAL HIGH (ref 0.0–0.2)

## 2020-01-13 LAB — BASIC METABOLIC PANEL
Anion gap: 3 — ABNORMAL LOW (ref 5–15)
BUN: 20 mg/dL (ref 6–20)
CO2: 28 mmol/L (ref 22–32)
Calcium: 9.2 mg/dL (ref 8.9–10.3)
Chloride: 108 mmol/L (ref 98–111)
Creatinine, Ser: 0.76 mg/dL (ref 0.44–1.00)
GFR, Estimated: >60 mL/min (ref >60)
Glucose, Bld: 105 mg/dL — ABNORMAL HIGH (ref 70–99)
Potassium: 3.9 mmol/L (ref 3.5–5.1)
Sodium: 139 mmol/L (ref 135–145)

## 2020-01-13 LAB — URINE CULTURE: Culture: 40000 — AB

## 2020-01-13 MED ORDER — HYDROMORPHONE HCL 1 MG/ML IJ SOLN
1.0000 mg | INTRAMUSCULAR | Status: DC | PRN
  Administered 2020-01-13 – 2020-01-15 (×11): 1 mg via INTRAVENOUS
  Filled 2020-01-13 (×11): qty 1

## 2020-01-13 NOTE — BH Specialist Note (Signed)
[facility] General Follow Up Note  01/13/2020 Name: Stephanie Burch MRN: [id number] DOB: 1980/08/31 Stephanie Burch is a 39 y.o. year old female who sees Kallie Locks, FNP for primary care. LCSW was initially consulted to support with stress precipitating sickle cell crisis and illness anxiety.  Interpreter: No.   Interpreter Name & Language: none  Assessment: Patient experiencing anxiety.  Ongoing Intervention: Today CSW visited briefly with patient at bedside in [place]. Will follow up by phone next week.   Review of patient status, including review of consultants reports, relevant laboratory and other test results, and collaboration with appropriate care team members and the patient's provider was performed as part of comprehensive patient evaluation and provision of services.     Follow up Plan: 1. CSW available from clinic as needed.   Abigail Butts, LCSW [facility] [phone]

## 2020-01-13 NOTE — Progress Notes (Signed)
Attempted lab draw from RUA PICC line. Attempt unsuccessful. Phlebotomy notified for a draw.

## 2020-01-13 NOTE — Consult Note (Signed)
   [facility] CM Inpatient Consult   01/13/2020  Stephanie Burch 27-Jan-1981 [id number]   Patient chart reviewed for potential [facility] Care Management [facility] CM) services due to high unplanned readmission risk score. Patient is eligible for care management and care coordination services at no cost or copay, as a part of her Medicaid benefit.  Called patient on inpatient telephone to explain care management service. Patient requests that I call her back in an hour. Will continue to follow for progression and disposition.   Of note, [facility] Care Management services does not replace or interfere with any services that are arranged by inpatient case management or social work.  Christophe Louis, MSN, RN [facility] Liaison Nurse Mobile Phone [phone]  Toll free office [phone]

## 2020-01-13 NOTE — Progress Notes (Signed)
Subjective: Stephanie Burch is a 39 year old female with a medical record significant for sickle cell disease, chronic pain syndrome, opiate dependence and tolerance, morbid obesity, history of depression and anxiety, and history of anemia of chronic disease was admitted for sickle cell pain crisis. Patient says the pain has improved minimally overnight.  She says that pain intensity is fluctuating between 6 and 7.  She denies any headache, chest pain, shortness of breath, urinary symptoms, nausea, vomiting, or diarrhea. Objective:  Vital signs in last 24 hours:  Vitals:   01/13/20 0144 01/13/20 0241 01/13/20 0623 01/13/20 1011  BP: 112/67  (!) 147/111 123/84  Pulse: (!) 102  94 82  Resp: 18 18 14 16   Temp: 98.3 F (36.8 C)  98.3 F (36.8 C) 97.7 F (36.5 C)  TempSrc: Oral  Oral Oral  SpO2: 99% 99% 98% 98%  Weight:   (!) 137.8 kg   Height:        Intake/Output from previous day:  No intake or output data in the 24 hours ending 01/13/20 1301  Physical Exam: General: Alert, awake, oriented x3, in no acute distress.  HEENT: [place]/AT PEERL, EOMI Neck: Trachea midline,  no masses, no thyromegal,y no JVD, no carotid bruit OROPHARYNX:  Moist, No exudate/ erythema/lesions.  Heart: Regular rate and rhythm, without murmurs, rubs, gallops, PMI non-displaced, no heaves or thrills on palpation.  Lungs: Clear to auscultation, no wheezing or rhonchi noted. No increased vocal fremitus resonant to percussion  Abdomen: Soft, nontender, nondistended, positive bowel sounds, no masses no hepatosplenomegaly noted..  Neuro: No focal neurological deficits noted cranial nerves II through XII grossly intact. DTRs 2+ bilaterally upper and lower extremities. Strength 5 out of 5 in bilateral upper and lower extremities. Musculoskeletal: No warm swelling or erythema around joints, no spinal tenderness noted. Psychiatric: Patient alert and oriented x3, good insight and cognition, good recent to remote recall. Lymph  node survey: No cervical axillary or inguinal lymphadenopathy noted.  Lab Results:  Basic Metabolic Panel:    Component Value Date/Time   NA 139 01/13/2020 1033   NA 142 09/11/2017 1431   K 3.9 01/13/2020 1033   CL 108 01/13/2020 1033   CO2 28 01/13/2020 1033   BUN 20 01/13/2020 1033   BUN 8 09/11/2017 1431   CREATININE 0.76 01/13/2020 1033   GLUCOSE 105 (H) 01/13/2020 1033   CALCIUM 9.2 01/13/2020 1033   CBC:    Component Value Date/Time   WBC 8.9 01/13/2020 1033   HGB 10.0 (L) 01/13/2020 1033   HGB 13.0 09/11/2017 1431   HCT 29.0 (L) 01/13/2020 1033   HCT 38.4 09/11/2017 1431   PLT 268 01/13/2020 1033   PLT 258 09/11/2017 1431   MCV 77.7 (L) 01/13/2020 1033   MCV 75 (L) 09/11/2017 1431   NEUTROABS 4.9 01/10/2020 1010   NEUTROABS 3.9 09/11/2017 1431   LYMPHSABS 2.1 01/10/2020 1010   LYMPHSABS 2.2 09/11/2017 1431   MONOABS 0.7 01/10/2020 1010   EOSABS 0.2 01/10/2020 1010   EOSABS 0.1 09/11/2017 1431   BASOSABS 0.0 01/10/2020 1010   BASOSABS 0.0 09/11/2017 1431    Recent Results (from the past 240 hour(s))  Urine culture     Status: Abnormal   Collection Time: 01/10/20  4:30 PM   Specimen: Urine, Clean Catch  Result Value Ref Range Status   Specimen Description   Final    URINE, CLEAN CATCH Performed at [facility], [address] [place], [place]  Special Requests   Final    NONE Performed at [facility], [address], [place], [place] [address]    Culture (A)  Final    40,000 COLONIES/mL MULTIPLE SPECIES PRESENT, SUGGEST RECOLLECTION   Report Status 01/13/2020 FINAL  Final  Resp Panel by RT-PCR (Flu A&B, Covid) Nasopharyngeal Swab     Status: None   Collection Time: 01/10/20  5:30 PM   Specimen: Nasopharyngeal Swab; Nasopharyngeal(NP) swabs in vial transport medium  Result Value Ref Range Status   SARS Coronavirus 2 by RT PCR NEGATIVE NEGATIVE Final    Comment: (NOTE) SARS-CoV-2 target nucleic acids  are NOT DETECTED.  The SARS-CoV-2 RNA is generally detectable in upper respiratory specimens during the acute phase of infection. The lowest concentration of SARS-CoV-2 viral copies this assay can detect is 138 copies/mL. A negative result does not preclude SARS-Cov-2 infection and should not be used as the sole basis for treatment or other patient management decisions. A negative result may occur with  improper specimen collection/handling, submission of specimen other than nasopharyngeal swab, presence of viral mutation(s) within the areas targeted by this assay, and inadequate number of viral copies(<138 copies/mL). A negative result must be combined with clinical observations, patient history, and epidemiological information. The expected result is Negative.  Fact Sheet for Patients:  BloggerCourse.com  Fact Sheet for Healthcare Providers:  SeriousBroker.it  This test is no t yet approved or cleared by the [place] FDA and  has been authorized for detection and/or diagnosis of SARS-CoV-2 by FDA under an Emergency Use Authorization (EUA). This EUA will remain  in effect (meaning this test can be used) for the duration of the COVID-19 declaration under Section 564(b)(1) of the Act, 21 U.S.C.section 360bbb-3(b)(1), unless the authorization is terminated  or revoked sooner.       Influenza A by PCR NEGATIVE NEGATIVE Final   Influenza B by PCR NEGATIVE NEGATIVE Final    Comment: (NOTE) The Xpert Xpress SARS-CoV-2/FLU/RSV plus assay is intended as an aid in the diagnosis of influenza from Nasopharyngeal swab specimens and should not be used as a sole basis for treatment. Nasal washings and aspirates are unacceptable for Xpert Xpress SARS-CoV-2/FLU/RSV testing.  Fact Sheet for Patients: BloggerCourse.com  Fact Sheet for Healthcare Providers: SeriousBroker.it  This test is  not yet approved or cleared by the [place] FDA and has been authorized for detection and/or diagnosis of SARS-CoV-2 by FDA under an Emergency Use Authorization (EUA). This EUA will remain in effect (meaning this test can be used) for the duration of the COVID-19 declaration under Section 564(b)(1) of the Act, 21 U.S.C. section 360bbb-3(b)(1), unless the authorization is terminated or revoked.  Performed at [facility], [address] [place], [place] [address]     Studies/Results: No results found.  Medications: Scheduled Meds: . enoxaparin (LOVENOX) injection  40 mg Subcutaneous Q24H  . folic acid  1 mg Oral Daily  . HYDROmorphone   Intravenous Q4H  . ketorolac  15 mg Intravenous Q6H  . metoprolol tartrate  12.5 mg Oral BID  . oxyCODONE  20 mg Oral Q12H  . senna-docusate  1 tablet Oral BID  . voxelotor  1,500 mg Oral Daily   Continuous Infusions: PRN Meds:.HYDROmorphone (DILAUDID) injection, naloxone **AND** sodium chloride flush, oxyCODONE, polyethylene glycol, sodium chloride flush  Consultants:  None  Procedures:  None  Antibiotics:  None  Assessment/Plan: Principal Problem:   Sickle cell pain crisis (HCC) Active Problems:   Morbidly obese (HCC)  Chronic pain syndrome  Sickle cell disease with pain crisis: Continue IV fluids and IV Dilaudid PCA Dilaudid 1 mg IV every 4 hours as needed Oxycodone 20 mg every 4 hours as needed for severe breakthrough pain per home dose Toradol 15 mg every 6 hours for total of 5 days Monitor vital signs very closely, reevaluate pain scale regularly, and supplemental oxygen as needed.  Sickle cell anemia: Patient's hemoglobin is stable and consistent with baseline.  There is no clinical indication for blood transfusion at this time.  Continue to follow labs.  Chronic pain syndrome: Continue home medications  Morbid obesity: Continue heart healthy diet  Code Status: Full Code Family  Communication: N/A Disposition Plan: Not yet ready for discharge  Anya Murphey Rennis Petty  APRN, MSN, FNP-C [facility] [address] [place], [place] [address] [phone]   If 7PM-7AM, please contact night-coverage.  01/13/2020, 1:01 PM  LOS: 3 days

## 2020-01-14 ENCOUNTER — Other Ambulatory Visit: Payer: Self-pay | Admitting: Internal Medicine

## 2020-01-14 DIAGNOSIS — G894 Chronic pain syndrome: Secondary | ICD-10-CM

## 2020-01-14 DIAGNOSIS — F419 Anxiety disorder, unspecified: Secondary | ICD-10-CM | POA: Diagnosis not present

## 2020-01-14 DIAGNOSIS — I10 Essential (primary) hypertension: Secondary | ICD-10-CM

## 2020-01-14 DIAGNOSIS — D5700 Hb-SS disease with crisis, unspecified: Secondary | ICD-10-CM | POA: Diagnosis not present

## 2020-01-14 NOTE — Progress Notes (Signed)
Patient ID: Stephanie Burch, female   DOB: 1980-08-22, 39 y.o.   MRN: [id number] Subjective: Stephanie Burch is a 39 year old female with a medical record significant for sickle cell disease, chronic pain syndrome, opiate dependence and tolerance, morbid obesity, history of depression and anxiety, and history of anemia of chronic disease was admitted for sickle cell pain crisis.  Patient says her pain is still significant and she is not going to be able to manage her pain at home.  She says it is not getting below 7/10, generalized, characterized as throbbing and achy.  She however denies any headache, chest pain, cough, shortness of breath, urinary symptoms, nausea, vomiting or diarrhea.  Objective:  Vital signs in last 24 hours:  Vitals:   01/13/20 2155 01/14/20 0204 01/14/20 0454 01/14/20 1026  BP: 131/65 (!) 113/49 117/60 (!) 107/59  Pulse: (!) 110 74 95 (!) 101  Resp: 20 18 16 16   Temp: 98.4 F (36.9 C) 98.3 F (36.8 C) 98.3 F (36.8 C) 98.4 F (36.9 C)  TempSrc: Oral Oral Oral Oral  SpO2: 100% 100% 99% 96%  Weight:   (!) 138 kg   Height:        Intake/Output from previous day:   Intake/Output Summary (Last 24 hours) at 01/14/2020 1353 Last data filed at 01/13/2020 1807 Gross per 24 hour  Intake 240 ml  Output --  Net 240 ml    Physical Exam: General: Alert, awake, oriented x3, in no acute distress.  Obese. HEENT: [place]/AT PEERL, EOMI Neck: Trachea midline,  no masses, no thyromegal,y no JVD, no carotid bruit OROPHARYNX:  Moist, No exudate/ erythema/lesions.  Heart: Regular rate and rhythm, without murmurs, rubs, gallops, PMI non-displaced, no heaves or thrills on palpation.  Lungs: Clear to auscultation, no wheezing or rhonchi noted. No increased vocal fremitus resonant to percussion  Abdomen: Soft, nontender, nondistended, positive bowel sounds, no masses no hepatosplenomegaly noted..  Neuro: No focal neurological deficits noted cranial nerves II through XII grossly  intact. DTRs 2+ bilaterally upper and lower extremities. Strength 5 out of 5 in bilateral upper and lower extremities. Musculoskeletal: No warm swelling or erythema around joints, no spinal tenderness noted. Psychiatric: Patient alert and oriented x3, good insight and cognition, good recent to remote recall. Lymph node survey: No cervical axillary or inguinal lymphadenopathy noted.  Lab Results:  Basic Metabolic Panel:    Component Value Date/Time   NA 139 01/13/2020 1033   NA 142 09/11/2017 1431   K 3.9 01/13/2020 1033   CL 108 01/13/2020 1033   CO2 28 01/13/2020 1033   BUN 20 01/13/2020 1033   BUN 8 09/11/2017 1431   CREATININE 0.76 01/13/2020 1033   GLUCOSE 105 (H) 01/13/2020 1033   CALCIUM 9.2 01/13/2020 1033   CBC:    Component Value Date/Time   WBC 8.9 01/13/2020 1033   HGB 10.0 (L) 01/13/2020 1033   HGB 13.0 09/11/2017 1431   HCT 29.0 (L) 01/13/2020 1033   HCT 38.4 09/11/2017 1431   PLT 268 01/13/2020 1033   PLT 258 09/11/2017 1431   MCV 77.7 (L) 01/13/2020 1033   MCV 75 (L) 09/11/2017 1431   NEUTROABS 4.9 01/10/2020 1010   NEUTROABS 3.9 09/11/2017 1431   LYMPHSABS 2.1 01/10/2020 1010   LYMPHSABS 2.2 09/11/2017 1431   MONOABS 0.7 01/10/2020 1010   EOSABS 0.2 01/10/2020 1010   EOSABS 0.1 09/11/2017 1431   BASOSABS 0.0 01/10/2020 1010   BASOSABS 0.0 09/11/2017 1431    Recent Results (from the  past 240 hour(s))  Urine culture     Status: Abnormal   Collection Time: 01/10/20  4:30 PM   Specimen: Urine, Clean Catch  Result Value Ref Range Status   Specimen Description   Final    URINE, CLEAN CATCH Performed at [facility], [address], [place], [place] [address]    Special Requests   Final    NONE Performed at [facility], [address] [place], [place] [address]    Culture (A)  Final    40,000 COLONIES/mL MULTIPLE SPECIES PRESENT, SUGGEST RECOLLECTION   Report Status 01/13/2020 FINAL  Final  Resp Panel by  RT-PCR (Flu A&B, Covid) Nasopharyngeal Swab     Status: None   Collection Time: 01/10/20  5:30 PM   Specimen: Nasopharyngeal Swab; Nasopharyngeal(NP) swabs in vial transport medium  Result Value Ref Range Status   SARS Coronavirus 2 by RT PCR NEGATIVE NEGATIVE Final    Comment: (NOTE) SARS-CoV-2 target nucleic acids are NOT DETECTED.  The SARS-CoV-2 RNA is generally detectable in upper respiratory specimens during the acute phase of infection. The lowest concentration of SARS-CoV-2 viral copies this assay can detect is 138 copies/mL. A negative result does not preclude SARS-Cov-2 infection and should not be used as the sole basis for treatment or other patient management decisions. A negative result may occur with  improper specimen collection/handling, submission of specimen other than nasopharyngeal swab, presence of viral mutation(s) within the areas targeted by this assay, and inadequate number of viral copies(<138 copies/mL). A negative result must be combined with clinical observations, patient history, and epidemiological information. The expected result is Negative.  Fact Sheet for Patients:  BloggerCourse.com  Fact Sheet for Healthcare Providers:  SeriousBroker.it  This test is no t yet approved or cleared by the [place] FDA and  has been authorized for detection and/or diagnosis of SARS-CoV-2 by FDA under an Emergency Use Authorization (EUA). This EUA will remain  in effect (meaning this test can be used) for the duration of the COVID-19 declaration under Section 564(b)(1) of the Act, 21 U.S.C.section 360bbb-3(b)(1), unless the authorization is terminated  or revoked sooner.       Influenza A by PCR NEGATIVE NEGATIVE Final   Influenza B by PCR NEGATIVE NEGATIVE Final    Comment: (NOTE) The Xpert Xpress SARS-CoV-2/FLU/RSV plus assay is intended as an aid in the diagnosis of influenza from Nasopharyngeal swab  specimens and should not be used as a sole basis for treatment. Nasal washings and aspirates are unacceptable for Xpert Xpress SARS-CoV-2/FLU/RSV testing.  Fact Sheet for Patients: BloggerCourse.com  Fact Sheet for Healthcare Providers: SeriousBroker.it  This test is not yet approved or cleared by the [place] FDA and has been authorized for detection and/or diagnosis of SARS-CoV-2 by FDA under an Emergency Use Authorization (EUA). This EUA will remain in effect (meaning this test can be used) for the duration of the COVID-19 declaration under Section 564(b)(1) of the Act, 21 U.S.C. section 360bbb-3(b)(1), unless the authorization is terminated or revoked.  Performed at [facility], [address], [place], [place] [address]     Studies/Results: No results found.  Medications: Scheduled Meds: . enoxaparin (LOVENOX) injection  40 mg Subcutaneous Q24H  . folic acid  1 mg Oral Daily  . HYDROmorphone   Intravenous Q4H  . ketorolac  15 mg Intravenous Q6H  . metoprolol tartrate  12.5 mg Oral BID  . oxyCODONE  20 mg Oral Q12H  . senna-docusate  1 tablet Oral BID  . voxelotor  1,500 mg Oral Daily   Continuous Infusions: PRN Meds:.HYDROmorphone (DILAUDID) injection, naloxone **AND** sodium chloride flush, oxyCODONE, polyethylene glycol, sodium chloride flush  Consultants:  None  Procedures:  None  Antibiotics:  None  Assessment/Plan: Principal Problem:   Sickle cell pain crisis (HCC) Active Problems:   Morbidly obese (HCC)   Chronic pain syndrome  1. Hb Sickle Cell Disease with crisis: Continue IVF at [facility], continue weight based Dilaudid PCA, continue Dilaudid 1 mg IV every 4 hours as needed for pain, continue oral oxycodone 20 mg tablet p.o. every 4 hourly as needed severe breakthrough pain, continue IV Toradol 15 mg Q 6 H for total of 5 days, Monitor vitals very closely, Re-evaluate pain  scale regularly, 2 L of Oxygen by [place]. 2. 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  No clinical indication for blood transfusion today. 3. Chronic pain Syndrome: Continue home pain medications. 4. Morbid obesity: Patient was counseled extensively about diet and exercise.  Continue heart healthy diet.  Code Status: Full Code Family Communication: N/A Disposition Plan: Possible discharge home in a.m.  [place]oh[place] Dung  If 7PM-7AM, please contact night-coverage.  01/14/2020, 1:53 PM  LOS: 4 days

## 2020-01-15 DIAGNOSIS — G894 Chronic pain syndrome: Secondary | ICD-10-CM | POA: Diagnosis not present

## 2020-01-15 DIAGNOSIS — D5700 Hb-SS disease with crisis, unspecified: Secondary | ICD-10-CM | POA: Diagnosis not present

## 2020-01-15 DIAGNOSIS — I10 Essential (primary) hypertension: Secondary | ICD-10-CM | POA: Diagnosis not present

## 2020-01-15 DIAGNOSIS — F419 Anxiety disorder, unspecified: Secondary | ICD-10-CM | POA: Diagnosis not present

## 2020-01-15 LAB — CBC
HCT: 28 % — ABNORMAL LOW (ref 36.0–46.0)
Hemoglobin: 9.4 g/dL — ABNORMAL LOW (ref 12.0–15.0)
MCH: 26.5 pg (ref 26.0–34.0)
MCHC: 33.6 g/dL (ref 30.0–36.0)
MCV: 78.9 fL — ABNORMAL LOW (ref 80.0–100.0)
Platelets: 267 10*3/uL (ref 150–400)
RBC: 3.55 MIL/uL — ABNORMAL LOW (ref 3.87–5.11)
RDW: 14 % (ref 11.5–15.5)
WBC: 8.7 10*3/uL (ref 4.0–10.5)
nRBC: 1 % — ABNORMAL HIGH (ref 0.0–0.2)

## 2020-01-15 LAB — BASIC METABOLIC PANEL
Anion gap: 11 (ref 5–15)
BUN: 14 mg/dL (ref 6–20)
CO2: 22 mmol/L (ref 22–32)
Calcium: 8.7 mg/dL — ABNORMAL LOW (ref 8.9–10.3)
Chloride: 108 mmol/L (ref 98–111)
Creatinine, Ser: 0.68 mg/dL (ref 0.44–1.00)
GFR, Estimated: >60 mL/min (ref >60)
Glucose, Bld: 111 mg/dL — ABNORMAL HIGH (ref 70–99)
Potassium: 4 mmol/L (ref 3.5–5.1)
Sodium: 141 mmol/L (ref 135–145)

## 2020-01-15 NOTE — Discharge Summary (Signed)
Physician Discharge Summary  Stephanie Burch EPP:[id number] DOB: Nov 16, 1980 DOA: 01/10/2020  PCP: Kallie Locks, FNP  Admit date: 01/10/2020  Discharge date: 01/15/2020  Discharge Diagnoses:  Principal Problem:   Sickle cell pain crisis [facility]) Active Problems:   Morbidly obese (HCC)   Chronic pain syndrome  Discharge Condition: Stable  Disposition:   Follow-up Information    Kallie Locks, FNP. Call in 2 day(s).   Specialty: Family Medicine Why: For pain medications refill and hospital follow-up appointment scheduled Contact information: [address] [place] [address] [phone]              Pt is discharged home in good condition and is to follow up with Kallie Locks, FNP this week to have labs evaluated. Stephanie Burch is instructed to increase activity slowly and balance with rest for the next few days, and use prescribed medication to complete treatment of pain  Diet: Regular Wt Readings from Last 3 Encounters:  01/14/20 (!) 138 kg  01/06/20 135 kg  11/28/19 127 kg    History of present illness:  Stephanie Burch  is a 39 y.o. female with a medical history significant for sickle cell disease, chronic pain syndrome, opiate dependence and tolerance, generalized anxiety, morbid obesity, essential hypertension, and history of anemia of chronic disease presented to sickle cell day clinic with complaints of low back, upper and lower extremity pain that is consistent with her typical sickle cell pain crisis.  Patient states that pain intensity increased several days ago and has been unrelieved by home medications.  She has not identified any palliative or provocative factors concerning current crisis. She characterizes her pain as constant, throbbing, and occasionally sharp. She says she is unable to find a comfortable position.  Patient rates pain as 10/10. She last had both oxycodone and OxyContin this a.m. without sustained relief. She denies any headache,  subjective fever, chills, headache, or shortness of breath.  No chest pain, dizziness, urinary symptoms, nausea, vomiting, or diarrhea.  No sick contacts, recent travel, or exposure to COVID-19.  Sickle cell day infusion center course: Vital signs show:BP (!) 118/58 (BP Location: Right Arm)   Pulse (!) 110   Temp 98.2 F (36.8 C) (Temporal)   Resp 20   LMP 11/11/2019   SpO2 96% Patient tachycardic, pulse 110.  Reviewed all labs, largely within normal limits.  Improved from patient's baseline.  COVID-19 test pending.  Patient's pain persists despite IV Dilaudid PCA, IV fluids, IV Toradol, and Tylenol. Patient admitted to MedSurg for further management of sickle cell pain crisis.  Hospital Course:  Patient was admitted for sickle cell pain crisis and managed appropriately with IVF, IV Dilaudid via PCA and IV Toradol, as well as other adjunct therapies per sickle cell pain management protocols.  Patient's hemoglobin remained stable at baseline throughout admission, she did not require any blood transfusion.  She slowly improved on above regimen, pain slowly returned to baseline, as at today patient is able to ambulate well with no significant pain, tolerating p.o. intake with no restrictions.  She has no nausea, vomiting or diarrhea, no constipation. She was hemodynamically stable throughout admission. Patient was therefore discharged home today in a hemodynamically stable condition.  She will call the office on Tuesday, January 17, 2020 for her medication refills and also schedule an appointment within 1 week of this discharge for hospital follow-up.  Stephanie Burch will follow-up with PCP within 1 week of this discharge. Stephanie Burch was counseled extensively about nonpharmacologic means of  pain management, patient verbalized understanding and was appreciative of  the care received during this admission.  Patient was also extensively counseled about diet and gentle exercise as a means of reducing her  weight.  We discussed the need for good hydration, monitoring of hydration status, avoidance of heat, cold, stress, and infection triggers. We discussed the need to be adherent with taking Hydrea and other home medications. Patient was reminded of the need to seek medical attention immediately if any symptom of bleeding, anemia, or infection occurs.  Discharge Exam: Vitals:   01/15/20 0439 01/15/20 1024  BP: (!) 106/55 105/62  Pulse: 85 81  Resp: 18 17  Temp: 98 F (36.7 C) 98 F (36.7 C)  SpO2: 98% 100%   Vitals:   01/14/20 2127 01/15/20 0006 01/15/20 0439 01/15/20 1024  BP: 117/73 (!) 108/40 (!) 106/55 105/62  Pulse: 91 83 85 81  Resp: 18 18 18 17   Temp: 99 F (37.2 C) 98.4 F (36.9 C) 98 F (36.7 C) 98 F (36.7 C)  TempSrc: Oral Oral Oral Oral  SpO2: 100% 100% 98% 100%  Weight:      Height:       General appearance: Awake, alert, not in any distress. Speech Clear. Not toxic looking HEENT: Atraumatic and Normocephalic, pupils equally reactive to light and accomodation Neck: Supple, no JVD. No cervical lymphadenopathy.  Chest: Good air entry bilaterally, no added sounds  CVS: S1 S2 regular, no murmurs.  Abdomen: Bowel sounds present, Non tender and not distended with no gaurding, rigidity or rebound. Extremities: B/L Lower Ext shows no edema, both legs are warm to touch Neurology: Awake alert, and oriented X 3, CN II-XII intact, Non focal Skin: No Rash  Discharge Instructions  Discharge Instructions    Diet - low sodium heart healthy   Complete by: As directed    Increase activity slowly   Complete by: As directed      Allergies as of 01/15/2020   No Known Allergies     Medication List    TAKE these medications   folic acid 1 MG tablet Commonly known as: FOLVITE Take 1 tablet (1 mg total) by mouth daily.   ibuprofen 800 MG tablet Commonly known as: ADVIL Take 1 tablet (800 mg total) by mouth every 8 (eight) hours as needed for headache, mild pain,  moderate pain or cramping.   naloxone 0.4 MG/ML injection Commonly known as: NARCAN As needed   ondansetron 4 MG disintegrating tablet Commonly known as: Zofran ODT Take 1 tablet (4 mg total) by mouth every 8 (eight) hours as needed for nausea or vomiting.   ondansetron 4 MG tablet Commonly known as: ZOFRAN Take 4 mg by mouth every 8 (eight) hours as needed for nausea or vomiting.   Oxbryta 500 MG Tabs tablet Generic drug: voxelotor Take 1,500 mg by mouth daily.   Oxycodone HCl 20 MG Tabs Take 1 tablet (20 mg total) by mouth every 4 (four) hours as needed (for pain).   tiZANidine 4 MG capsule Commonly known as: ZANAFLEX Take 1 capsule (4 mg total) by mouth 3 (three) times daily as needed for muscle spasms.   valACYclovir 500 MG tablet Commonly known as: Valtrex Take Valtrex 500 mg daily for suppression of outbreaks. Take Valtrex 1000 mg daily X 1 week for outbreaks. What changed:   how much to take  how to take this  when to take this  additional instructions   Vitamin D (Ergocalciferol) 1.25 MG (50000 UNIT) Caps capsule  Commonly known as: DRISDOL Take 1 capsule (50,000 Units total) by mouth every 7 (seven) days.   Xtampza ER 18 MG C12a Generic drug: oxyCODONE ER Take 18 mg by mouth every 12 (twelve) hours.       The results of significant diagnostics from this hospitalization (including imaging, microbiology, ancillary and laboratory) are listed below for reference.    Significant Diagnostic Studies: No results found.  Microbiology: Recent Results (from the past 240 hour(s))  Urine culture     Status: Abnormal   Collection Time: 01/10/20  4:30 PM   Specimen: Urine, Clean Catch  Result Value Ref Range Status   Specimen Description   Final    URINE, CLEAN CATCH Performed at [facility], [address] [place], [place] [address]    Special Requests   Final    NONE Performed at [facility], [address]  [place], [place] [address]    Culture (A)  Final    40,000 COLONIES/mL MULTIPLE SPECIES PRESENT, SUGGEST RECOLLECTION   Report Status 01/13/2020 FINAL  Final  Resp Panel by RT-PCR (Flu A&B, Covid) Nasopharyngeal Swab     Status: None   Collection Time: 01/10/20  5:30 PM   Specimen: Nasopharyngeal Swab; Nasopharyngeal(NP) swabs in vial transport medium  Result Value Ref Range Status   SARS Coronavirus 2 by RT PCR NEGATIVE NEGATIVE Final    Comment: (NOTE) SARS-CoV-2 target nucleic acids are NOT DETECTED.  The SARS-CoV-2 RNA is generally detectable in upper respiratory specimens during the acute phase of infection. The lowest concentration of SARS-CoV-2 viral copies this assay can detect is 138 copies/mL. A negative result does not preclude SARS-Cov-2 infection and should not be used as the sole basis for treatment or other patient management decisions. A negative result may occur with  improper specimen collection/handling, submission of specimen other than nasopharyngeal swab, presence of viral mutation(s) within the areas targeted by this assay, and inadequate number of viral copies(<138 copies/mL). A negative result must be combined with clinical observations, patient history, and epidemiological information. The expected result is Negative.  Fact Sheet for Patients:  BloggerCourse.com  Fact Sheet for Healthcare Providers:  SeriousBroker.it  This test is no t yet approved or cleared by the [place] FDA and  has been authorized for detection and/or diagnosis of SARS-CoV-2 by FDA under an Emergency Use Authorization (EUA). This EUA will remain  in effect (meaning this test can be used) for the duration of the COVID-19 declaration under Section 564(b)(1) of the Act, 21 U.S.C.section 360bbb-3(b)(1), unless the authorization is terminated  or revoked sooner.       Influenza A by PCR NEGATIVE NEGATIVE Final   Influenza B by PCR  NEGATIVE NEGATIVE Final    Comment: (NOTE) The Xpert Xpress SARS-CoV-2/FLU/RSV plus assay is intended as an aid in the diagnosis of influenza from Nasopharyngeal swab specimens and should not be used as a sole basis for treatment. Nasal washings and aspirates are unacceptable for Xpert Xpress SARS-CoV-2/FLU/RSV testing.  Fact Sheet for Patients: BloggerCourse.com  Fact Sheet for Healthcare Providers: SeriousBroker.it  This test is not yet approved or cleared by the [place] FDA and has been authorized for detection and/or diagnosis of SARS-CoV-2 by FDA under an Emergency Use Authorization (EUA). This EUA will remain in effect (meaning this test can be used) for the duration of the COVID-19 declaration under Section 564(b)(1) of the Act, 21 U.S.C. section 360bbb-3(b)(1), unless the authorization is terminated or revoked.  Performed at [facility]  [facility], [address], [place], [place] [address]      Labs: Basic Metabolic Panel: Recent Labs  Lab 01/10/20 1010 01/13/20 1033 01/15/20 0545  NA 139 139 141  K 3.9 3.9 4.0  CL 105 108 108  CO2 24 28 22   GLUCOSE 120* 105* 111*  BUN 11 20 14   CREATININE 0.54 0.76 0.68  CALCIUM 9.8 9.2 8.7*   Liver Function Tests: Recent Labs  Lab 01/10/20 1010  AST 17  ALT 18  ALKPHOS 72  BILITOT 1.4*  PROT 7.8  ALBUMIN 4.4   No results for input(s): LIPASE, AMYLASE in the last 168 hours. No results for input(s): AMMONIA in the last 168 hours. CBC: Recent Labs  Lab 01/10/20 1010 01/11/20 0848 01/13/20 1033 01/15/20 0545  WBC 7.8 12.1* 8.9 8.7  NEUTROABS 4.9  --   --   --   HGB 12.6 10.3* 10.0* 9.4*  HCT 36.5 30.3* 29.0* 28.0*  MCV 77.8* 78.5* 77.7* 78.9*  PLT 297 252 268 267   Cardiac Enzymes: No results for input(s): CKTOTAL, CKMB, CKMBINDEX, TROPONINI in the last 168 hours. BNP: Invalid input(s): POCBNP CBG: No results for input(s): GLUCAP in the  last 168 hours.  Time coordinating discharge: 50 minutes  Signed:  Khaila Velarde  [facility] 01/15/2020, 1:34 PM

## 2020-01-16 ENCOUNTER — Telehealth: Payer: Self-pay

## 2020-01-16 NOTE — Telephone Encounter (Signed)
Transition Care Management Unsuccessful Follow-up Telephone Call  Date of discharge and from where:  01/15/2020 [name]  Attempts:  1st Attempt  Reason for unsuccessful TCM follow-up call:  Left voice message

## 2020-01-17 ENCOUNTER — Encounter: Payer: Self-pay | Admitting: Family Medicine

## 2020-01-17 ENCOUNTER — Other Ambulatory Visit: Payer: Self-pay | Admitting: Internal Medicine

## 2020-01-17 DIAGNOSIS — D571 Sickle-cell disease without crisis: Secondary | ICD-10-CM

## 2020-01-17 DIAGNOSIS — F1190 Opioid use, unspecified, uncomplicated: Secondary | ICD-10-CM

## 2020-01-17 DIAGNOSIS — G894 Chronic pain syndrome: Secondary | ICD-10-CM

## 2020-01-17 MED ORDER — OXYCODONE HCL 20 MG PO TABS: 20.0000 mg | ORAL_TABLET | ORAL | 0 refills | Status: DC | PRN

## 2020-01-17 NOTE — Telephone Encounter (Signed)
Transition Care Management Unsuccessful Follow-up Telephone Call  Date of discharge and from where:  01/15/2020 Stephanie Burch  Attempts:  2nd Attempt  Reason for unsuccessful TCM follow-up call:  No answer/busy

## 2020-01-18 ENCOUNTER — Other Ambulatory Visit: Payer: Self-pay | Admitting: Family Medicine

## 2020-01-18 DIAGNOSIS — G894 Chronic pain syndrome: Secondary | ICD-10-CM

## 2020-01-18 DIAGNOSIS — D571 Sickle-cell disease without crisis: Secondary | ICD-10-CM

## 2020-01-18 DIAGNOSIS — F1190 Opioid use, unspecified, uncomplicated: Secondary | ICD-10-CM

## 2020-01-18 MED ORDER — OXYCODONE HCL 20 MG PO TABS: 20.0000 mg | ORAL_TABLET | ORAL | 0 refills | Status: DC | PRN

## 2020-01-18 MED ORDER — XTAMPZA ER 18 MG PO C12A: 18.0000 mg | EXTENDED_RELEASE_CAPSULE | Freq: Two times a day (BID) | ORAL | 0 refills | Status: DC

## 2020-01-18 NOTE — Telephone Encounter (Signed)
Done

## 2020-01-18 NOTE — Telephone Encounter (Signed)
Transition Care Management Unsuccessful Follow-up Telephone Call  Date of discharge and from where:  01/15/2020 [name]   Attempts:  3rd Attempt  Reason for unsuccessful TCM follow-up call:  Left voice message

## 2020-01-23 ENCOUNTER — Telehealth: Payer: Self-pay | Admitting: Family Medicine

## 2020-01-24 ENCOUNTER — Other Ambulatory Visit: Payer: Self-pay | Admitting: Family Medicine

## 2020-01-24 ENCOUNTER — Ambulatory Visit: Payer: Medicaid Other | Attending: Family Medicine

## 2020-01-24 DIAGNOSIS — G894 Chronic pain syndrome: Secondary | ICD-10-CM

## 2020-01-24 DIAGNOSIS — F1190 Opioid use, unspecified, uncomplicated: Secondary | ICD-10-CM

## 2020-01-24 DIAGNOSIS — D571 Sickle-cell disease without crisis: Secondary | ICD-10-CM

## 2020-01-24 NOTE — Telephone Encounter (Signed)
Done

## 2020-01-30 ENCOUNTER — Other Ambulatory Visit: Payer: Self-pay | Admitting: Family Medicine

## 2020-01-30 DIAGNOSIS — G894 Chronic pain syndrome: Secondary | ICD-10-CM

## 2020-01-30 DIAGNOSIS — F1190 Opioid use, unspecified, uncomplicated: Secondary | ICD-10-CM

## 2020-01-30 DIAGNOSIS — D571 Sickle-cell disease without crisis: Secondary | ICD-10-CM

## 2020-01-31 ENCOUNTER — Telehealth: Payer: Self-pay | Admitting: Family Medicine

## 2020-01-31 ENCOUNTER — Other Ambulatory Visit: Payer: Self-pay | Admitting: Family Medicine

## 2020-01-31 DIAGNOSIS — F1190 Opioid use, unspecified, uncomplicated: Secondary | ICD-10-CM

## 2020-01-31 DIAGNOSIS — D571 Sickle-cell disease without crisis: Secondary | ICD-10-CM

## 2020-01-31 DIAGNOSIS — G894 Chronic pain syndrome: Secondary | ICD-10-CM

## 2020-01-31 MED ORDER — OXYCODONE HCL 20 MG PO TABS: 20.0000 mg | ORAL_TABLET | ORAL | 0 refills | Status: AC | PRN

## 2020-01-31 NOTE — Telephone Encounter (Signed)
Done

## 2020-02-10 ENCOUNTER — Encounter (HOSPITAL_COMMUNITY): Payer: Self-pay | Admitting: *Deleted

## 2020-02-10 ENCOUNTER — Inpatient Hospital Stay (HOSPITAL_COMMUNITY)
Admission: EM | Admit: 2020-02-10 | Discharge: 2020-02-15 | DRG: 812 | Disposition: A | Discharge status: Home / Self Care | Payer: Medicaid Other | Attending: Internal Medicine | Admitting: Internal Medicine

## 2020-02-10 ENCOUNTER — Telehealth (HOSPITAL_COMMUNITY): Payer: Self-pay | Admitting: General Practice

## 2020-02-10 DIAGNOSIS — D5700 Hb-SS disease with crisis, unspecified: Principal | ICD-10-CM | POA: Diagnosis present

## 2020-02-10 DIAGNOSIS — Z79899 Other long term (current) drug therapy: Secondary | ICD-10-CM

## 2020-02-10 DIAGNOSIS — M25561 Pain in right knee: Secondary | ICD-10-CM | POA: Diagnosis present

## 2020-02-10 DIAGNOSIS — F1120 Opioid dependence, uncomplicated: Secondary | ICD-10-CM | POA: Diagnosis present

## 2020-02-10 DIAGNOSIS — E559 Vitamin D deficiency, unspecified: Secondary | ICD-10-CM | POA: Diagnosis present

## 2020-02-10 DIAGNOSIS — B009 Herpesviral infection, unspecified: Secondary | ICD-10-CM | POA: Diagnosis present

## 2020-02-10 DIAGNOSIS — G894 Chronic pain syndrome: Secondary | ICD-10-CM | POA: Diagnosis present

## 2020-02-10 DIAGNOSIS — Z20822 Contact with and (suspected) exposure to covid-19: Secondary | ICD-10-CM | POA: Diagnosis present

## 2020-02-10 DIAGNOSIS — Z832 Family history of diseases of the blood and blood-forming organs and certain disorders involving the immune mechanism: Secondary | ICD-10-CM

## 2020-02-10 DIAGNOSIS — Z6841 Body Mass Index (BMI) 40.0 and over, adult: Secondary | ICD-10-CM

## 2020-02-10 DIAGNOSIS — M25562 Pain in left knee: Secondary | ICD-10-CM | POA: Diagnosis present

## 2020-02-10 LAB — CBC WITH DIFFERENTIAL/PLATELET
Abs Immature Granulocytes: 0.01 10*3/uL (ref 0.00–0.07)
Basophils Absolute: 0 10*3/uL (ref 0.0–0.1)
Basophils Relative: 0 %
Eosinophils Absolute: 0.1 10*3/uL (ref 0.0–0.5)
Eosinophils Relative: 1 %
HCT: 38.8 % (ref 36.0–46.0)
Hemoglobin: 13.2 g/dL (ref 12.0–15.0)
Immature Granulocytes: 0 %
Lymphocytes Relative: 22 %
Lymphs Abs: 1.8 10*3/uL (ref 0.7–4.0)
MCH: 24.9 pg — ABNORMAL LOW (ref 26.0–34.0)
MCHC: 34 g/dL (ref 30.0–36.0)
MCV: 73.1 fL — ABNORMAL LOW (ref 80.0–100.0)
Monocytes Absolute: 0.5 10*3/uL (ref 0.1–1.0)
Monocytes Relative: 6 %
Neutro Abs: 5.8 10*3/uL (ref 1.7–7.7)
Neutrophils Relative %: 71 %
Platelets: 285 10*3/uL (ref 150–400)
RBC: 5.31 MIL/uL — ABNORMAL HIGH (ref 3.87–5.11)
RDW: 14 % (ref 11.5–15.5)
WBC: 8.3 10*3/uL (ref 4.0–10.5)
nRBC: 0 % (ref 0.0–0.2)

## 2020-02-10 LAB — BASIC METABOLIC PANEL
Anion gap: 10 (ref 5–15)
BUN: 12 mg/dL (ref 6–20)
CO2: 24 mmol/L (ref 22–32)
Calcium: 10 mg/dL (ref 8.9–10.3)
Chloride: 107 mmol/L (ref 98–111)
Creatinine, Ser: 0.76 mg/dL (ref 0.44–1.00)
GFR, Estimated: >60 mL/min (ref >60)
Glucose, Bld: 103 mg/dL — ABNORMAL HIGH (ref 70–99)
Potassium: 3.9 mmol/L (ref 3.5–5.1)
Sodium: 141 mmol/L (ref 135–145)

## 2020-02-10 LAB — I-STAT BETA HCG BLOOD, ED (MC, WL, AP ONLY): I-stat hCG, quantitative: <5 m[IU]/mL (ref <5)

## 2020-02-10 LAB — RETICULOCYTES
Immature Retic Fract: 23.7 % — ABNORMAL HIGH (ref 2.3–15.9)
RBC.: 5.26 MIL/uL — ABNORMAL HIGH (ref 3.87–5.11)
Retic Count, Absolute: 152.5 10*3/uL (ref 19.0–186.0)
Retic Ct Pct: 2.9 % (ref 0.4–3.1)

## 2020-02-10 LAB — RESP PANEL BY RT-PCR (FLU A&B, COVID) ARPGX2
Influenza A by PCR: NEGATIVE
Influenza B by PCR: NEGATIVE
SARS Coronavirus 2 by RT PCR: NEGATIVE

## 2020-02-10 MED ORDER — FOLIC ACID 1 MG PO TABS
1.0000 mg | ORAL_TABLET | Freq: Every day | ORAL | Status: DC
Start: 2020-02-11 — End: 2020-02-15
  Administered 2020-02-11 – 2020-02-15 (×5): 1 mg via ORAL
  Filled 2020-02-10 (×5): qty 1

## 2020-02-10 MED ORDER — HYDROMORPHONE HCL 2 MG/ML IJ SOLN
2.0000 mg | INTRAMUSCULAR | Status: DC
  Filled 2020-02-10: qty 1

## 2020-02-10 MED ORDER — ENOXAPARIN SODIUM 40 MG/0.4ML ~~LOC~~ SOLN
40.0000 mg | SUBCUTANEOUS | Status: DC
  Administered 2020-02-11 – 2020-02-15 (×5): 40 mg via SUBCUTANEOUS
  Filled 2020-02-10 (×5): qty 0.4

## 2020-02-10 MED ORDER — POLYETHYLENE GLYCOL 3350 17 G PO PACK: 17.0000 g | PACK | Freq: Every day | ORAL | Status: DC | PRN

## 2020-02-10 MED ORDER — HYDROMORPHONE HCL 2 MG/ML IJ SOLN
2.0000 mg | Freq: Once | INTRAMUSCULAR | Status: AC
  Administered 2020-02-10: 2 mg via SUBCUTANEOUS

## 2020-02-10 MED ORDER — DEXTROSE-NACL 5-0.45 % IV SOLN: INTRAVENOUS | Status: AC

## 2020-02-10 MED ORDER — ONDANSETRON HCL 4 MG/2ML IJ SOLN
4.0000 mg | Freq: Once | INTRAMUSCULAR | Status: AC
  Administered 2020-02-10: 4 mg via INTRAVENOUS
  Filled 2020-02-10: qty 2

## 2020-02-10 MED ORDER — SENNOSIDES-DOCUSATE SODIUM 8.6-50 MG PO TABS
1.0000 | ORAL_TABLET | Freq: Two times a day (BID) | ORAL | Status: DC
  Administered 2020-02-11 – 2020-02-15 (×9): 1 via ORAL
  Filled 2020-02-10 (×10): qty 1

## 2020-02-10 MED ORDER — HYDROMORPHONE 1 MG/ML IV SOLN
INTRAVENOUS | Status: DC
  Filled 2020-02-10: qty 30

## 2020-02-10 MED ORDER — OXYCODONE ER 18 MG PO C12A: 18.0000 mg | EXTENDED_RELEASE_CAPSULE | Freq: Two times a day (BID) | ORAL | Status: DC

## 2020-02-10 MED ORDER — NALOXONE HCL 0.4 MG/ML IJ SOLN: 0.4000 mg | INTRAMUSCULAR | Status: DC | PRN

## 2020-02-10 MED ORDER — DIPHENHYDRAMINE HCL 25 MG PO CAPS: 25.0000 mg | ORAL_CAPSULE | ORAL | Status: DC | PRN

## 2020-02-10 MED ORDER — SODIUM CHLORIDE 0.9 % IV SOLN
25.0000 mg | INTRAVENOUS | Status: DC | PRN
  Filled 2020-02-10: qty 0.5

## 2020-02-10 MED ORDER — HYDROMORPHONE HCL 2 MG/ML IJ SOLN
2.0000 mg | INTRAMUSCULAR | Status: AC
  Administered 2020-02-10: 2 mg via INTRAVENOUS
  Filled 2020-02-10: qty 1

## 2020-02-10 MED ORDER — OXYCODONE HCL ER 20 MG PO T12A
20.0000 mg | EXTENDED_RELEASE_TABLET | Freq: Two times a day (BID) | ORAL | Status: DC
  Administered 2020-02-10 – 2020-02-15 (×10): 20 mg via ORAL
  Filled 2020-02-10 (×7): qty 1
  Filled 2020-02-10: qty 2
  Filled 2020-02-10: qty 1
  Filled 2020-02-10: qty 2

## 2020-02-10 MED ORDER — SODIUM CHLORIDE 0.9% FLUSH: 9.0000 mL | INTRAVENOUS | Status: DC | PRN

## 2020-02-10 MED ORDER — KETOROLAC TROMETHAMINE 15 MG/ML IJ SOLN
15.0000 mg | Freq: Four times a day (QID) | INTRAMUSCULAR | Status: DC
  Administered 2020-02-10 – 2020-02-15 (×19): 15 mg via INTRAVENOUS
  Filled 2020-02-10 (×19): qty 1

## 2020-02-10 MED ORDER — HYDROMORPHONE HCL 2 MG/ML IJ SOLN
2.0000 mg | INTRAMUSCULAR | Status: AC | PRN
Start: 2020-02-10 — End: 2020-02-10
  Administered 2020-02-10: 2 mg via INTRAVENOUS
  Filled 2020-02-10: qty 1

## 2020-02-10 MED ORDER — ONDANSETRON HCL 4 MG/2ML IJ SOLN
4.0000 mg | INTRAMUSCULAR | Status: DC | PRN
  Administered 2020-02-11: 4 mg via INTRAVENOUS
  Filled 2020-02-10: qty 2

## 2020-02-10 MED ORDER — OXYCODONE HCL 5 MG PO TABS
15.0000 mg | ORAL_TABLET | Freq: Once | ORAL | Status: AC
  Administered 2020-02-10: 15 mg via ORAL
  Filled 2020-02-10: qty 3

## 2020-02-10 MED ORDER — DEXTROSE-NACL 5-0.45 % IV SOLN: INTRAVENOUS | Status: DC

## 2020-02-10 NOTE — ED Notes (Signed)
Pt states that she never really needs the cardiac monitoring and asked not to be put on it.

## 2020-02-10 NOTE — ED Provider Notes (Signed)
[facility]-EMERGENCY DEPT Provider Note   CSN: [id number] Arrival date & time: 02/10/20  1407     History Chief Complaint  Patient presents with  . Sickle Cell Pain Crisis    Stephanie Burch is a 40 y.o. female with a past medical history of sickle cell anemia presenting to the ED with a chief complaint of sickle cell pain crisis.  Symptoms began about 3 days ago.  Reports pain in bilateral lower extremities despite her home pain medication.  Last took this yesterday.  States that this is typically where her pain crisis occurs.  Denies any chest pain, shortness of breath, fever, injuries or falls, numbness, leg swelling, vomiting.  Denies possibility of pregnancy.  HPI     Past Medical History:  Diagnosis Date  . HSV infection   . Nausea   . Sickle cell anemia (HCC)   . Vitamin B12 deficiency 12/2018  . Vitamin D deficiency     Patient Active Problem List   Diagnosis Date Noted  . Pulmonary edema 11/11/2019  . Hypoxia   . Poor venous access 11/09/2019  . HCAP (healthcare-associated pneumonia) 11/07/2019  . Sepsis (HCC) 11/07/2019  . Acute kidney injury (HCC) 11/07/2019  . History of COVID-19 11/07/2019  . COVID-19 virus detected 10/22/2019  . Sore throat due to virus 10/22/2019  . Urinary frequency 04/20/2019  . Vaginal odor 04/20/2019  . Chest pain varying with breathing   . Fever and chills   . Vitamin D deficiency 11/15/2018  . Nausea 11/15/2018  . Abnormal pulse oximetry   . Hypokalemia 09/20/2018  . Medication management 09/14/2018  . Muscle spasms of both lower extremities 08/25/2018  . Subjective visual disturbance of right eye 08/25/2018  . 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 Chronic pain syndrome 03/16/2018  . Chronic, continuous use of opioids 03/16/2018  . Tachycardia with heart rate 100-120 beats per minute   . Sickle cell crisis (HCC) 10/19/2017  . Leukocytosis 10/19/2017  . Thrombocytopenia (HCC) 10/19/2017  .  Morbidly obese (HCC) 10/19/2017  . Sickle cell pain crisis (HCC) 10/15/2017    History reviewed. No pertinent surgical history.   OB History    Gravida  3   Para      Term      Preterm      AB      Living  3     SAB      IAB      Ectopic      Multiple      Live Births              Family History  Problem Relation Age of Onset  . Hypertension Mother   . Sickle cell trait Mother   . Diabetes Father   . Sickle cell trait Father     Social History   Tobacco Use  . Smoking status: Never Smoker  . Smokeless tobacco: Never Used  Vaping Use  . Vaping Use: Never used  Substance Use Topics  . Alcohol use: Not Currently  . Drug use: Never    Home Medications Prior to Admission medications   Medication Sig Start Date End Date Taking? Authorizing Provider  folic acid (FOLVITE) 1 MG tablet Take 1 tablet (1 mg total) by mouth daily. 11/15/18  Yes Kallie Locks, FNP  ibuprofen (ADVIL) 800 MG tablet Take 1 tablet (800 mg total) by mouth every 8 (eight) hours as needed for headache, mild pain, moderate pain or cramping. 08/15/19  Yes Kallie Locks, FNP  ondansetron (ZOFRAN) 4 MG tablet Take 4 mg by mouth every 8 (eight) hours as needed for nausea or vomiting.  11/25/19  Yes [provider]  oxyCODONE ER (XTAMPZA ER) 18 MG C12A Take 18 mg by mouth every 12 (twelve) hours. 01/25/20  Yes Kallie Locks, FNP  Oxycodone HCl 20 MG TABS Take 1 tablet (20 mg total) by mouth every 4 (four) hours as needed for up to 15 days (for pain). 02/01/20 02/16/20 Yes Kallie Locks, FNP  tiZANidine (ZANAFLEX) 4 MG capsule Take 1 capsule (4 mg total) by mouth 3 (three) times daily as needed for muscle spasms. 10/07/19 05/04/20 Yes Kallie Locks, FNP  valACYclovir (VALTREX) 500 MG tablet Take Valtrex 500 mg daily for suppression of outbreaks. Take Valtrex 1000 mg daily X 1 week for outbreaks. Patient taking differently: Take 500 mg by mouth daily. 10/20/19  Yes Kallie Locks, FNP  Vitamin D, Ergocalciferol, (DRISDOL) 1.25 MG (50000 UT) CAPS capsule Take 1 capsule (50,000 Units total) by mouth every 7 (seven) days. Patient taking differently: Take 50,000 Units by mouth every 7 (seven) days. Take on Sunday 12/21/18  Yes Kallie Locks, FNP  voxelotor (OXBRYTA) 500 MG TABS tablet Take 1,500 mg by mouth daily. 01/11/20  Yes Kallie Locks, FNP  naloxone [facility]) 0.4 MG/ML injection As needed Patient not taking: Reported on 01/10/2020 09/14/18   Kallie Locks, FNP  ondansetron (ZOFRAN ODT) 4 MG disintegrating tablet Take 1 tablet (4 mg total) by mouth every 8 (eight) hours as needed for nausea or vomiting. Patient not taking: Reported on 02/10/2020 11/28/19   Tanda Rockers, PA-C    Allergies    Patient has no known allergies.  Review of Systems   Review of Systems  Constitutional: Negative for appetite change, chills and fever.  HENT: Negative for ear pain, rhinorrhea, sneezing and sore throat.   Eyes: Negative for photophobia and visual disturbance.  Respiratory: Negative for cough, chest tightness, shortness of breath and wheezing.   Cardiovascular: Negative for chest pain and palpitations.  Gastrointestinal: Negative for abdominal pain, blood in stool, constipation, diarrhea, nausea and vomiting.  Genitourinary: Negative for dysuria, hematuria and urgency.  Musculoskeletal: Positive for myalgias.  Skin: Negative for rash.  Neurological: Negative for dizziness, weakness and light-headedness.    Physical Exam Updated Vital Signs BP (!) 137/101   Pulse (!) 103   Temp 99.3 F (37.4 C) (Oral)   Resp 18   SpO2 99%   Physical Exam Vitals and nursing note reviewed.  Constitutional:      General: She is not in acute distress.    Appearance: She is well-developed and well-nourished.  HENT:     Head: Normocephalic and atraumatic.     Nose: Nose normal.  Eyes:     General: No scleral icterus.       Right eye: No discharge.        Left  eye: No discharge.     Extraocular Movements: EOM normal.     Conjunctiva/sclera: Conjunctivae normal.  Cardiovascular:     Rate and Rhythm: Normal rate and regular rhythm.     Pulses: Intact distal pulses.     Heart sounds: Normal heart sounds. No murmur heard. No friction rub. No gallop.   Pulmonary:     Effort: Pulmonary effort is normal. No respiratory distress.     Breath sounds: Normal breath sounds.  Abdominal:     General: Bowel sounds are normal.  There is no distension.     Palpations: Abdomen is soft.     Tenderness: There is no abdominal tenderness. There is no guarding.  Musculoskeletal:        General: No edema. Normal range of motion.     Cervical back: Normal range of motion and neck supple.     Right lower leg: No edema.     Left lower leg: No edema.  Skin:    General: Skin is warm and dry.     Findings: No rash.  Neurological:     Mental Status: She is alert.     Motor: No abnormal muscle tone.     Coordination: Coordination normal.  Psychiatric:        Mood and Affect: Mood and affect normal.     ED Results / Procedures / Treatments   Labs (all labs ordered are listed, but only abnormal results are displayed) Labs Reviewed  BASIC METABOLIC PANEL - Abnormal; Notable for the following components:      Result Value   Glucose, Bld 103 (*)    All other components within normal limits  CBC WITH DIFFERENTIAL/PLATELET - Abnormal; Notable for the following components:   RBC 5.31 (*)    MCV 73.1 (*)    MCH 24.9 (*)    All other components within normal limits  RETICULOCYTES - Abnormal; Notable for the following components:   RBC. 5.26 (*)    Immature Retic Fract 23.7 (*)    All other components within normal limits  RESP PANEL BY RT-PCR (FLU A&B, COVID) [id number]  I-STAT BETA HCG BLOOD, ED ([facility], [facility], [facility] ONLY)    EKG None  Radiology No results found.  Procedures Procedures (including critical care time)  Medications Ordered in ED Medications  dextrose  5 %-0.45 % sodium chloride infusion ( Intravenous New Bag/Given 02/10/20 1834)  HYDROmorphone (DILAUDID) injection 2 mg (has no administration in time range)  HYDROmorphone (DILAUDID) injection 2 mg (2 mg Intravenous Given 02/10/20 1837)  oxyCODONE (Oxy IR/ROXICODONE) immediate release tablet 15 mg (15 mg Oral Given 02/10/20 1929)  ondansetron (ZOFRAN) injection 4 mg (4 mg Intravenous Given 02/10/20 1855)    ED Course  I have reviewed the triage vital signs and the nursing notes.  Pertinent labs & imaging results that were available during my care of the patient were reviewed by me and considered in my medical decision making (see chart for details).  Clinical Course as of 02/10/20 2052  Ludwig Clarks Feb 10, 2020  1827 Hemoglobin: 13.2 [HK]  1827 I-stat hCG, quantitative: <5.0 [HK]    Clinical Course User Index [HK] Delia Heady, PA-C   MDM Rules/Calculators/A&P                           Pt presents to the ED in Sickle Cell Crisis. The pain is normal per the patients sickle cell pain, which includes pain in her bilateral lower extremities. The patient denies recent fevers or illness, CP, SOB, weakness. The patient has tried taking pain medications at home but has not been able to control the pain. Pt currently in pain but in not acute distress.  Denies possibility of pregnancy.  Will order lab work and begin treatment for pain crisis.  Lab work unremarkable, hemoglobin at 13.2 and hCG is negative.  She is requesting nausea medication as well.  Will monitor for improvement in her tachycardia and pain.  Her tachycardia has improved but with 2 doses of  Dilaudid and oral opioid pain medicine her symptoms have not resolved.  She states that she typically gets admitted for ongoing pain control for these pain crises.  I suspect that her symptoms are due to a sickle cell pain crisis.  No signs of acute chest or neurological cause at this time.  We will have her admitted for ongoing pain control.   Portions of  this note were generated with Scientist, clinical (histocompatibility and immunogenetics). Dictation errors may occur despite best attempts at proofreading.  Final Clinical Impression(s) / ED Diagnoses Final diagnoses:  Sickle cell pain crisis [facility])    Rx / DC Orders ED Discharge Orders    None       Dietrich Pates, PA-C 02/10/20 2052    Gerhard Munch, MD 02/11/20 1401

## 2020-02-10 NOTE — ED Notes (Signed)
This nurse attempted to insert a new peripheral IV, because the first IV had infiltrated. Unsuccessful attempt. Will call for ultrasound help from additional staff member.

## 2020-02-10 NOTE — Discharge Instructions (Addendum)
Sickle Cell Anemia, Adult  Sickle cell anemia is a condition where your red blood cells are shaped like sickles. Red blood cells carry oxygen through the body. Sickle-shaped cells do not live as long as normal red blood cells. They also clump together and block blood from flowing through the blood vessels. This prevents the body from getting enough oxygen. Sickle cell anemia causes organ damage and pain. It also increases the risk of infection. Follow these instructions at home: Medicines  Take over-the-counter and prescription medicines only as told by your doctor.  If you were prescribed an antibiotic medicine, take it as told by your doctor. Do not stop taking the antibiotic even if you start to feel better.  If you develop a fever, do not take medicines to lower the fever right away. Tell your doctor about the fever. Managing pain, stiffness, and swelling  Try these methods to help with pain: ? Use a heating pad. ? Take a warm bath. ? Distract yourself, such as by watching TV. Eating and drinking  Drink enough fluid to keep your pee (urine) clear or pale yellow. Drink more in hot weather and during exercise.  Limit or avoid alcohol.  Eat a healthy diet. Eat plenty of fruits, vegetables, whole grains, and lean protein.  Take vitamins and supplements as told by your doctor. Traveling  When traveling, keep these with you: ? Your medical information. ? The names of your doctors. ? Your medicines.  If you need to take an airplane, talk to your doctor first. Activity  Rest often.  Avoid exercises that make your heart beat much faster, such as jogging. General instructions  Do not use products that have nicotine or tobacco, such as cigarettes and e-cigarettes. If you need help quitting, ask your doctor.  Consider wearing a medical alert bracelet.  Avoid being in high places (high altitudes), such as mountains.  Avoid very hot or cold temperatures.  Avoid places where the  temperature changes a lot.  Keep all follow-up visits as told by your doctor. This is important. Contact a doctor if:  A joint hurts.  Your feet or hands hurt or swell.  You feel tired (fatigued). Get help right away if:  You have symptoms of infection. These include: ? Fever. ? Chills. ? Being very tired. ? Irritability. ? Poor eating. ? Throwing up (vomiting).  You feel dizzy or faint.  You have new stomach pain, especially on the left side.  You have a an erection (priapism) that lasts more than 4 hours.  You have numbness in your arms or legs.  You have a hard time moving your arms or legs.  You have trouble talking.  You have pain that does not go away when you take medicine.  You are short of breath.  You are breathing fast.  You have a long-term cough.  You have pain in your chest.  You have a bad headache.  You have a stiff neck.  Your stomach looks bloated even though you did not eat much.  Your skin is pale.  You suddenly cannot see well. Summary  Sickle cell anemia is a condition where your red blood cells are shaped like sickles.  Follow your doctor's advice on ways to manage pain, food to eat, activities to do, and steps to take for safe travel.  Get medical help right away if you have any signs of infection, such as a fever. This information is not intended to replace advice given to you by   your health care provider. Make sure you discuss any questions you have with your health care provider. Document Revised: 06/16/2019 Document Reviewed: 06/16/2019 Elsevier Patient Education  2021 Elsevier Inc.  

## 2020-02-10 NOTE — H&P (Signed)
History and Physical    Stephanie Burch LKG:[id number] DOB: 1980-05-20 DOA: 02/10/2020  PCP: Kallie Locks, FNP  Patient coming from: Home  I have personally briefly reviewed patient's old medical records in [facility]  Chief Complaint: Sickle cell pain  HPI: Stephanie Burch is a 40 y.o. female with medical history significant for sickle cell anemia, chronic pain syndrome with opiate dependence, obesity who presents to ED for evaluation of sickle cell pain.  Patient states she has been having ongoing bilateral lower extremity pain over the last 3 days typical of her sickle cell pain.  She has been having to increase her home pain medications but has not had significant improvement therefore came to the ED for further evaluation.  She is not currently having any upper extremity pain.  She denies any subjective fevers, chills, diaphoresis, chest pain, palpitations, dyspnea, abdominal pain, nausea, vomiting, dysuria, diarrhea, or swelling of her lower extremities.  ED Course:  Initial vitals showed BP 156/100, pulse 109, RR 18, temp 99.3 F, SPO2 97% on room air.  Labs are notable for WBC 8.3, hemoglobin 13.2, hematocrit 38.8, platelets 285,000, sodium 141, potassium 3.9, bicarb 24, BUN 12, creatinine 0.76, serum glucose 103, reticulocyte 2.9%, i-STAT beta-hCG <5.0.  SARS-CoV-2 PCR panel is obtained and pending.  Patient was given OxyIR 15 mg orally, IV Dilaudid 2 mg x 2, Zofran, and IV fluids without significant improvement.  The hospitalist service was consulted to admit for further evaluation and management.  Review of Systems: All systems reviewed and are negative except as documented in history of present illness above.   Past Medical History:  Diagnosis Date  . HSV infection   . Nausea   . Sickle cell anemia (HCC)   . Vitamin B12 deficiency 12/2018  . Vitamin D deficiency     History reviewed. No pertinent surgical history.  Social History:  reports that she has never  smoked. She has never used smokeless tobacco. She reports previous alcohol use. She reports that she does not use drugs.  No Known Allergies  Family History  Problem Relation Age of Onset  . Hypertension Mother   . Sickle cell trait Mother   . Diabetes Father   . Sickle cell trait Father      Prior to Admission medications   Medication Sig Start Date End Date Taking? Authorizing Provider  folic acid (FOLVITE) 1 MG tablet Take 1 tablet (1 mg total) by mouth daily. 11/15/18  Yes Kallie Locks, FNP  ibuprofen (ADVIL) 800 MG tablet Take 1 tablet (800 mg total) by mouth every 8 (eight) hours as needed for headache, mild pain, moderate pain or cramping. 08/15/19  Yes Kallie Locks, FNP  ondansetron (ZOFRAN) 4 MG tablet Take 4 mg by mouth every 8 (eight) hours as needed for nausea or vomiting.  11/25/19  Yes [provider]  oxyCODONE ER (XTAMPZA ER) 18 MG C12A Take 18 mg by mouth every 12 (twelve) hours. 01/25/20  Yes Kallie Locks, FNP  Oxycodone HCl 20 MG TABS Take 1 tablet (20 mg total) by mouth every 4 (four) hours as needed for up to 15 days (for pain). 02/01/20 02/16/20 Yes Kallie Locks, FNP  tiZANidine (ZANAFLEX) 4 MG capsule Take 1 capsule (4 mg total) by mouth 3 (three) times daily as needed for muscle spasms. 10/07/19 05/04/20 Yes Kallie Locks, FNP  valACYclovir (VALTREX) 500 MG tablet Take Valtrex 500 mg daily for suppression of outbreaks. Take Valtrex 1000 mg daily X 1  week for outbreaks. Patient taking differently: Take 500 mg by mouth daily. 10/20/19  Yes Azzie Glatter, FNP  Vitamin D, Ergocalciferol, (DRISDOL) 1.25 MG (50000 UT) CAPS capsule Take 1 capsule (50,000 Units total) by mouth every 7 (seven) days. Patient taking differently: Take 50,000 Units by mouth every 7 (seven) days. Take on Sunday 12/21/18  Yes Azzie Glatter, FNP  voxelotor (OXBRYTA) 500 MG TABS tablet Take 1,500 mg by mouth daily. 01/11/20  Yes Azzie Glatter, FNP  naloxone  [facility]) 0.4 MG/ML injection As needed Patient not taking: Reported on 01/10/2020 09/14/18   Azzie Glatter, FNP  ondansetron (ZOFRAN ODT) 4 MG disintegrating tablet Take 1 tablet (4 mg total) by mouth every 8 (eight) hours as needed for nausea or vomiting. Patient not taking: Reported on 02/10/2020 11/28/19   Eustaquio Maize, PA-C    Physical Exam: Vitals:   02/10/20 1845 02/10/20 1900 02/10/20 2005 02/10/20 2115  BP: (!) 137/94 (!) 128/95 (!) 137/101 (!) 135/112  Pulse: 99 95 (!) 103 (!) 104  Resp: 18  18 18   Temp:      TempSrc:      SpO2: 98% 99% 99% 100%   Constitutional: Sitting up in bed, NAD, calm, appears somewhat uncomfortable but otherwise appropriate Eyes: PERRL, lids and conjunctivae normal ENMT: Mucous membranes are moist. Posterior pharynx clear of any exudate or lesions.Normal dentition.  Neck: normal, supple, no masses. Respiratory: clear to auscultation bilaterally, no wheezing, no crackles. Normal respiratory effort. No accessory muscle use.  Cardiovascular: Regular rate and rhythm, no murmurs / rubs / gallops. No extremity edema. 2+ pedal pulses. Abdomen: no tenderness, no masses palpated. No hepatosplenomegaly. Bowel sounds positive.  Musculoskeletal: no clubbing / cyanosis. No joint deformity upper and lower extremities. Good ROM, no contractures. Normal muscle tone.  Tender to palpation to both lower extremities throughout. Skin: no rashes, lesions, ulcers. No induration Neurologic: CN 2-12 grossly intact. Sensation intact, Strength 5/5 in all 4.  Psychiatric: Normal judgment and insight. Alert and oriented x 3. Normal mood.   Labs on Admission: I have personally reviewed following labs and imaging studies  CBC: Recent Labs  Lab 02/10/20 1759  WBC 8.3  NEUTROABS 5.8  HGB 13.2  HCT 38.8  MCV 73.1*  PLT 937   Basic Metabolic Panel: Recent Labs  Lab 02/10/20 1759  NA 141  K 3.9  CL 107  CO2 24  GLUCOSE 103*  BUN 12  CREATININE 0.76  CALCIUM 10.0    GFR: CrCl cannot be calculated (Unknown ideal weight.). Liver Function Tests: No results for input(s): AST, ALT, ALKPHOS, BILITOT, PROT, ALBUMIN in the last 168 hours. No results for input(s): LIPASE, AMYLASE in the last 168 hours. No results for input(s): AMMONIA in the last 168 hours. Coagulation Profile: No results for input(s): INR, PROTIME in the last 168 hours. Cardiac Enzymes: No results for input(s): CKTOTAL, CKMB, CKMBINDEX, TROPONINI in the last 168 hours. BNP (last 3 results) No results for input(s): PROBNP in the last 8760 hours. HbA1C: No results for input(s): HGBA1C in the last 72 hours. CBG: No results for input(s): GLUCAP in the last 168 hours. Lipid Profile: No results for input(s): CHOL, HDL, LDLCALC, TRIG, CHOLHDL, LDLDIRECT in the last 72 hours. Thyroid Function Tests: No results for input(s): TSH, T4TOTAL, FREET4, T3FREE, THYROIDAB in the last 72 hours. Anemia Panel: Recent Labs    02/10/20 1759  RETICCTPCT 2.9   Urine analysis:    Component Value Date/Time   COLORURINE YELLOW 11/28/2019 1443  APPEARANCEUR CLEAR 11/28/2019 1443   LABSPEC 1.017 11/28/2019 1443   PHURINE 7.0 11/28/2019 1443   GLUCOSEU NEGATIVE 11/28/2019 1443   HGBUR NEGATIVE 11/28/2019 1443   BILIRUBINUR NEGATIVE 11/28/2019 1443   BILIRUBINUR Negative 04/27/2019 0935   KETONESUR NEGATIVE 11/28/2019 1443   PROTEINUR NEGATIVE 11/28/2019 1443   UROBILINOGEN 1.0 04/27/2019 0935   NITRITE NEGATIVE 11/28/2019 1443   LEUKOCYTESUR NEGATIVE 11/28/2019 1443    Radiological Exams on Admission: No results found.  EKG: Not performed.  Assessment/Plan Active Problems:   Sickle cell pain crisis (HCC)  Stephanie Burch is a 40 y.o. female with medical history significant for sickle cell anemia, chronic pain syndrome with opiate dependence, obesity who is admitted with sickle cell pain crisis.  Sickle cell anemia with acute pain crisis: Persistent pain unrelieved with home medications are  initial management in the ED. Denies any chest pain and is saturating well on room air. Hemoglobin is stable. -Continue IV Dilaudid 2 mg q2h prn while holding in the ED -Plan to start weight-based Dilaudid PCA pump when patient moved to floor -Start IV Toradol 15 mg every 6 hours -Continue oxycodone 20 mg every 12 hours (in place of home Xtampza ER per pharmacy) -Continue IV fluids with D5-1/2 NS overnight -Continue supportive care with Zofran and Benadryl as needed  DVT prophylaxis: Lovenox Code Status: Full code, confirmed with patient Family Communication: Discussed with patient, she has discussed with family Disposition Plan: From home likely discharge to home pending adequate pain control Consults called: None Admission status:  Status is: Observation  The patient remains OBS appropriate and will d/c before 2 midnights.  Dispo: The patient is from: Home              Anticipated d/c is to: Home              Anticipated d/c date is: 1 day              Patient currently is not medically stable to d/c.  Darreld Mclean MD [facility]  If 7PM-7AM, please contact night-coverage [email or web address]  02/10/2020, 9:23 PM

## 2020-02-10 NOTE — Telephone Encounter (Signed)
Patient called, requesting to come to the day hospital due to pain in the legs rated at 9/10. In view of the time, patient advised it's too late to come to the day clinic. Patient told to keep taking prescribed home medications but if the pain is unbearable she can go to the ER. Patient notified, verbalized understanding.

## 2020-02-10 NOTE — ED Triage Notes (Signed)
Pt complains of sickle cell pain crisis x 3 days. She has tried home meds w/o relief. Pain is in legs.

## 2020-02-10 NOTE — ED Notes (Signed)
Pt requesting nausea medication. Said pain and nausea medication are typically given together. Said nausea is 3/10. Messaged provider

## 2020-02-11 ENCOUNTER — Other Ambulatory Visit: Payer: Self-pay

## 2020-02-11 DIAGNOSIS — E559 Vitamin D deficiency, unspecified: Secondary | ICD-10-CM | POA: Diagnosis present

## 2020-02-11 DIAGNOSIS — Z20822 Contact with and (suspected) exposure to covid-19: Secondary | ICD-10-CM | POA: Diagnosis present

## 2020-02-11 DIAGNOSIS — G894 Chronic pain syndrome: Secondary | ICD-10-CM | POA: Diagnosis not present

## 2020-02-11 DIAGNOSIS — Z832 Family history of diseases of the blood and blood-forming organs and certain disorders involving the immune mechanism: Secondary | ICD-10-CM | POA: Diagnosis not present

## 2020-02-11 DIAGNOSIS — F1120 Opioid dependence, uncomplicated: Secondary | ICD-10-CM | POA: Diagnosis present

## 2020-02-11 DIAGNOSIS — M25561 Pain in right knee: Secondary | ICD-10-CM | POA: Diagnosis present

## 2020-02-11 DIAGNOSIS — Z79899 Other long term (current) drug therapy: Secondary | ICD-10-CM | POA: Diagnosis not present

## 2020-02-11 DIAGNOSIS — M25562 Pain in left knee: Secondary | ICD-10-CM | POA: Diagnosis present

## 2020-02-11 DIAGNOSIS — D5700 Hb-SS disease with crisis, unspecified: Secondary | ICD-10-CM | POA: Diagnosis not present

## 2020-02-11 DIAGNOSIS — Z6841 Body Mass Index (BMI) 40.0 and over, adult: Secondary | ICD-10-CM | POA: Diagnosis not present

## 2020-02-11 DIAGNOSIS — B009 Herpesviral infection, unspecified: Secondary | ICD-10-CM | POA: Diagnosis present

## 2020-02-11 MED ORDER — TIZANIDINE HCL 4 MG PO TABS: 4.0000 mg | ORAL_TABLET | Freq: Three times a day (TID) | ORAL | Status: DC | PRN

## 2020-02-11 MED ORDER — VOXELOTOR 500 MG PO TABS: 1500.0000 mg | ORAL_TABLET | Freq: Every day | ORAL | Status: DC

## 2020-02-11 MED ORDER — HYDROMORPHONE 1 MG/ML IV SOLN
INTRAVENOUS | Status: DC
  Administered 2020-02-11: 7.8 mg via INTRAVENOUS
  Administered 2020-02-11 – 2020-02-12 (×2): 30 mg via INTRAVENOUS
  Administered 2020-02-12: 9.6 mg via INTRAVENOUS
  Administered 2020-02-12: 10.2 mg via INTRAVENOUS
  Administered 2020-02-12: 10.8 mg via INTRAVENOUS
  Administered 2020-02-12: 30 mg via INTRAVENOUS
  Administered 2020-02-12: 11.4 mg via INTRAVENOUS
  Administered 2020-02-12: 30 mg via INTRAVENOUS
  Administered 2020-02-12: 7.8 mg via INTRAVENOUS
  Administered 2020-02-12: 6 mg via INTRAVENOUS
  Administered 2020-02-13: 12.6 mg via INTRAVENOUS
  Administered 2020-02-13: 6.6 mg via INTRAVENOUS
  Administered 2020-02-13: 6 mg via INTRAVENOUS
  Filled 2020-02-11 (×3): qty 30

## 2020-02-11 MED ORDER — OXYCODONE HCL 5 MG PO TABS
20.0000 mg | ORAL_TABLET | ORAL | Status: DC | PRN
  Administered 2020-02-12 – 2020-02-15 (×12): 20 mg via ORAL
  Filled 2020-02-11 (×12): qty 4

## 2020-02-11 MED ORDER — HYDROMORPHONE HCL 2 MG/ML IJ SOLN
2.0000 mg | INTRAMUSCULAR | Status: DC | PRN
  Administered 2020-02-11 (×7): 2 mg via INTRAVENOUS
  Filled 2020-02-11 (×7): qty 1

## 2020-02-11 MED ORDER — DEXTROSE-NACL 5-0.45 % IV SOLN: INTRAVENOUS | Status: DC

## 2020-02-11 MED ORDER — OXYCODONE HCL ER 20 MG PO T12A: 20.0000 mg | EXTENDED_RELEASE_TABLET | Freq: Two times a day (BID) | ORAL | Status: DC

## 2020-02-11 NOTE — Progress Notes (Signed)
Patient ID: Stephanie Burch, female   DOB: May 24, 1980, 40 y.o.   MRN: [id number] Subjective: Stephanie Burch is a 40 year old female with a medical history significant for sickle cell disease, chronic pain syndrome, opiate dependence and tolerance, morbid obesity, history of depression and anxiety, and history of anemia of chronic disease was admitted for sickle cell pain crisis.  Patient just got to her room from the ED.  There is no new complaints except that she is still in significant amount of pain primarily to her lower back and lower extremities.  She rates her pain at 8/10.  She denies any fever, chest pain, cough, shortness of breath, urinary symptoms, nausea, vomiting or diarrhea.  Objective:  Vital signs in last 24 hours:  Vitals:   02/11/20 1445 02/11/20 1500 02/11/20 1550 02/11/20 1551  BP:   (!) 145/93 (!) 145/93  Pulse: 90  85 92  Resp:   15 15  Temp:   98.2 F (36.8 C) 98.2 F (36.8 C)  TempSrc:   Oral Oral  SpO2: 96%  99% 100%  Weight:  128.4 kg    Height:  5\' 5"  (1.651 m)      Intake/Output from previous day:  No intake or output data in the 24 hours ending 02/11/20 1604  Physical Exam: General: Alert, awake, oriented x3, in no acute distress.  Obese HEENT: [place]/AT PEERL, EOMI Neck: Trachea midline,  no masses, no thyromegal,y no JVD, no carotid bruit OROPHARYNX:  Moist, No exudate/ erythema/lesions.  Heart: Regular rate and rhythm, without murmurs, rubs, gallops, PMI non-displaced, no heaves or thrills on palpation.  Lungs: Clear to auscultation, no wheezing or rhonchi noted. No increased vocal fremitus resonant to percussion  Abdomen: Soft, nontender, nondistended, positive bowel sounds, no masses no hepatosplenomegaly noted..  Neuro: No focal neurological deficits noted cranial nerves II through XII grossly intact. DTRs 2+ bilaterally upper and lower extremities. Strength 5 out of 5 in bilateral upper and lower extremities. Musculoskeletal: No warm swelling or  erythema around joints, no spinal tenderness noted. Psychiatric: Patient alert and oriented x3, good insight and cognition, good recent to remote recall. Lymph node survey: No cervical axillary or inguinal lymphadenopathy noted.  Lab Results:  Basic Metabolic Panel:    Component Value Date/Time   NA 141 02/10/2020 1759   NA 142 09/11/2017 1431   K 3.9 02/10/2020 1759   CL 107 02/10/2020 1759   CO2 24 02/10/2020 1759   BUN 12 02/10/2020 1759   BUN 8 09/11/2017 1431   CREATININE 0.76 02/10/2020 1759   GLUCOSE 103 (H) 02/10/2020 1759   CALCIUM 10.0 02/10/2020 1759   CBC:    Component Value Date/Time   WBC 8.3 02/10/2020 1759   HGB 13.2 02/10/2020 1759   HGB 13.0 09/11/2017 1431   HCT 38.8 02/10/2020 1759   HCT 38.4 09/11/2017 1431   PLT 285 02/10/2020 1759   PLT 258 09/11/2017 1431   MCV 73.1 (L) 02/10/2020 1759   MCV 75 (L) 09/11/2017 1431   NEUTROABS 5.8 02/10/2020 1759   NEUTROABS 3.9 09/11/2017 1431   LYMPHSABS 1.8 02/10/2020 1759   LYMPHSABS 2.2 09/11/2017 1431   MONOABS 0.5 02/10/2020 1759   EOSABS 0.1 02/10/2020 1759   EOSABS 0.1 09/11/2017 1431   BASOSABS 0.0 02/10/2020 1759   BASOSABS 0.0 09/11/2017 1431    Recent Results (from the past 240 hour(s))  Resp Panel by RT-PCR (Flu A&B, Covid) Nasopharyngeal Swab     Status: None   Collection Time: 02/10/20  8:50 PM   Specimen: Nasopharyngeal Swab; Nasopharyngeal(NP) swabs in vial transport medium  Result Value Ref Range Status   SARS Coronavirus 2 by RT PCR NEGATIVE NEGATIVE Final    Comment: (NOTE) SARS-CoV-2 target nucleic acids are NOT DETECTED.  The SARS-CoV-2 RNA is generally detectable in upper respiratory specimens during the acute phase of infection. The lowest concentration of SARS-CoV-2 viral copies this assay can detect is 138 copies/mL. A negative result does not preclude SARS-Cov-2 infection and should not be used as the sole basis for treatment or other patient management decisions. A negative  result may occur with  improper specimen collection/handling, submission of specimen other than nasopharyngeal swab, presence of viral mutation(s) within the areas targeted by this assay, and inadequate number of viral copies(<138 copies/mL). A negative result must be combined with clinical observations, patient history, and epidemiological information. The expected result is Negative.  Fact Sheet for Patients:  BloggerCourse.com  Fact Sheet for Healthcare Providers:  SeriousBroker.it  This test is no t yet approved or cleared by the [place] FDA and  has been authorized for detection and/or diagnosis of SARS-CoV-2 by FDA under an Emergency Use Authorization (EUA). This EUA will remain  in effect (meaning this test can be used) for the duration of the COVID-19 declaration under Section 564(b)(1) of the Act, 21 U.S.C.section 360bbb-3(b)(1), unless the authorization is terminated  or revoked sooner.       Influenza A by PCR NEGATIVE NEGATIVE Final   Influenza B by PCR NEGATIVE NEGATIVE Final    Comment: (NOTE) The Xpert Xpress SARS-CoV-2/FLU/RSV plus assay is intended as an aid in the diagnosis of influenza from Nasopharyngeal swab specimens and should not be used as a sole basis for treatment. Nasal washings and aspirates are unacceptable for Xpert Xpress SARS-CoV-2/FLU/RSV testing.  Fact Sheet for Patients: BloggerCourse.com  Fact Sheet for Healthcare Providers: SeriousBroker.it  This test is not yet approved or cleared by the [place] FDA and has been authorized for detection and/or diagnosis of SARS-CoV-2 by FDA under an Emergency Use Authorization (EUA). This EUA will remain in effect (meaning this test can be used) for the duration of the COVID-19 declaration under Section 564(b)(1) of the Act, 21 U.S.C. section 360bbb-3(b)(1), unless the authorization is  terminated or revoked.  Performed at [facility], [address] [place], [place] [address]     Studies/Results: No results found.  Medications: Scheduled Meds: . enoxaparin (LOVENOX) injection  40 mg Subcutaneous Q24H  . folic acid  1 mg Oral Daily  . HYDROmorphone   Intravenous Q4H  . ketorolac  15 mg Intravenous Q6H  . oxyCODONE  20 mg Oral Q12H  . oxyCODONE  20 mg Oral Q12H  . senna-docusate  1 tablet Oral BID  . voxelotor  1,500 mg Oral Daily   Continuous Infusions: . dextrose 5 % and 0.45% NaCl    . diphenhydrAMINE     PRN Meds:.diphenhydrAMINE **OR** diphenhydrAMINE, naloxone **AND** sodium chloride flush, ondansetron (ZOFRAN) IV, Oxycodone HCl, polyethylene glycol, tiZANidine  Consultants:  None  Procedures:  None  Antibiotics:  None  Assessment/Plan: Principal Problem:   Sickle cell pain crisis (HCC)  1. Hb Sickle Cell Disease with crisis: Continue IVF D5 .45% Saline @ 125 mls/hour, reduce the setting of weight based Dilaudid PCA to 0.6/10/4 from 1/10/6, continue IV Toradol 15 mg Q 6 H for total of 5 days, restart and continue oral home pain medications, monitor vitals very closely, Re-evaluate pain scale regularly, 2 L of  Oxygen by [place]. 2. Sickle Cell Anemia: Hemoglobin is stable at baseline today.  There is no clinical indication for blood transfusion.  Will monitor closely and transfuse as needed. 3. Chronic pain Syndrome: Restart and continue home medications. 4. Morbid obesity: Patient counseled extensively about nutrition and gentle exercise.  Heart healthy diet recommended.  Code Status: Full Code Family Communication: N/A Disposition Plan: Not yet ready for discharge  Olugbemiga Jegede  If 7PM-7AM, please contact night-coverage.  02/11/2020, 4:04 PM  LOS: 0 days

## 2020-02-12 LAB — CBC WITH DIFFERENTIAL/PLATELET
Abs Immature Granulocytes: 0.03 10*3/uL (ref 0.00–0.07)
Basophils Absolute: 0 10*3/uL (ref 0.0–0.1)
Basophils Relative: 0 %
Eosinophils Absolute: 0.4 10*3/uL (ref 0.0–0.5)
Eosinophils Relative: 5 %
HCT: 31.6 % — ABNORMAL LOW (ref 36.0–46.0)
Hemoglobin: 10.9 g/dL — ABNORMAL LOW (ref 12.0–15.0)
Immature Granulocytes: 0 %
Lymphocytes Relative: 22 %
Lymphs Abs: 2 10*3/uL (ref 0.7–4.0)
MCH: 25.2 pg — ABNORMAL LOW (ref 26.0–34.0)
MCHC: 34.5 g/dL (ref 30.0–36.0)
MCV: 73.1 fL — ABNORMAL LOW (ref 80.0–100.0)
Monocytes Absolute: 0.7 10*3/uL (ref 0.1–1.0)
Monocytes Relative: 7 %
Neutro Abs: 6 10*3/uL (ref 1.7–7.7)
Neutrophils Relative %: 66 %
Platelets: 196 10*3/uL (ref 150–400)
RBC: 4.32 MIL/uL (ref 3.87–5.11)
RDW: 13.8 % (ref 11.5–15.5)
WBC: 9.1 10*3/uL (ref 4.0–10.5)
nRBC: 0.2 % (ref 0.0–0.2)

## 2020-02-12 MED ORDER — HYDROMORPHONE HCL 1 MG/ML IJ SOLN
0.5000 mg | Freq: Once | INTRAMUSCULAR | Status: AC
  Administered 2020-02-12: 0.5 mg via INTRAVENOUS
  Filled 2020-02-12: qty 0.5

## 2020-02-12 NOTE — Progress Notes (Signed)
Patient ID: Stephanie Burch, female   DOB: [birth year], 40 y.o.   MRN: [id number] Subjective: Stephanie Burch is a 40 year old female with a medical history significant for sickle cell disease, chronic pain syndrome, opiate dependence and tolerance, morbid obesity, history of depression and anxiety, and history of anemia of chronic disease was admitted for sickle cell pain crisis.  Patient claims her pain is slightly better but still not at the level that she can manage at home.  She rates her pain at 7/10, characterized as throbbing and achy.  She denies any fever, headache, dizziness, cough, chest pain, shortness of breath, nausea, vomiting or diarrhea.  No urinary symptoms.  Objective:  Vital signs in last 24 hours:  Vitals:   02/12/20 0838 02/12/20 0942 02/12/20 1214 02/12/20 1403  BP:  108/70  138/86  Pulse:  90  85  Resp: 16 18 11 17   Temp:  97.6 F (36.4 C)    TempSrc:  Oral    SpO2: 98% 100% 97% 97%  Weight:      Height:        Intake/Output from previous day:   Intake/Output Summary (Last 24 hours) at 02/12/2020 1537 Last data filed at 02/12/2020 1532 Gross per 24 hour  Intake 3390.77 ml  Output --  Net 3390.77 ml    Physical Exam: General: Alert, awake, oriented x3, in no acute distress.  Obese HEENT: [place]/AT PEERL, EOMI Neck: Trachea midline,  no masses, no thyromegal,y no JVD, no carotid bruit OROPHARYNX:  Moist, No exudate/ erythema/lesions.  Heart: Regular rate and rhythm, without murmurs, rubs, gallops, PMI non-displaced, no heaves or thrills on palpation.  Lungs: Clear to auscultation, no wheezing or rhonchi noted. No increased vocal fremitus resonant to percussion  Abdomen: Soft, nontender, nondistended, positive bowel sounds, no masses no hepatosplenomegaly noted..  Neuro: No focal neurological deficits noted cranial nerves II through XII grossly intact. DTRs 2+ bilaterally upper and lower extremities. Strength 5 out of 5 in bilateral upper and lower  extremities. Musculoskeletal: No warm swelling or erythema around joints, no spinal tenderness noted. Psychiatric: Patient alert and oriented x3, good insight and cognition, good recent to remote recall. Lymph node survey: No cervical axillary or inguinal lymphadenopathy noted.  Lab Results:  Basic Metabolic Panel:    Component Value Date/Time   NA 141 02/10/2020 1759   NA 142 09/11/2017 1431   K 3.9 02/10/2020 1759   CL 107 02/10/2020 1759   CO2 24 02/10/2020 1759   BUN 12 02/10/2020 1759   BUN 8 09/11/2017 1431   CREATININE 0.76 02/10/2020 1759   GLUCOSE 103 (H) 02/10/2020 1759   CALCIUM 10.0 02/10/2020 1759   CBC:    Component Value Date/Time   WBC 9.1 02/12/2020 0711   HGB 10.9 (L) 02/12/2020 0711   HGB 13.0 09/11/2017 1431   HCT 31.6 (L) 02/12/2020 0711   HCT 38.4 09/11/2017 1431   PLT 196 02/12/2020 0711   PLT 258 09/11/2017 1431   MCV 73.1 (L) 02/12/2020 0711   MCV 75 (L) 09/11/2017 1431   NEUTROABS 6.0 02/12/2020 0711   NEUTROABS 3.9 09/11/2017 1431   LYMPHSABS 2.0 02/12/2020 0711   LYMPHSABS 2.2 09/11/2017 1431   MONOABS 0.7 02/12/2020 0711   EOSABS 0.4 02/12/2020 0711   EOSABS 0.1 09/11/2017 1431   BASOSABS 0.0 02/12/2020 0711   BASOSABS 0.0 09/11/2017 1431    Recent Results (from the past 240 hour(s))  Resp Panel by RT-PCR (Flu A&B, Covid) Nasopharyngeal Swab  Status: None   Collection Time: 02/10/20  8:50 PM   Specimen: Nasopharyngeal Swab; Nasopharyngeal(NP) swabs in vial transport medium  Result Value Ref Range Status   SARS Coronavirus 2 by RT PCR NEGATIVE NEGATIVE Final    Comment: (NOTE) SARS-CoV-2 target nucleic acids are NOT DETECTED.  The SARS-CoV-2 RNA is generally detectable in upper respiratory specimens during the acute phase of infection. The lowest concentration of SARS-CoV-2 viral copies this assay can detect is 138 copies/mL. A negative result does not preclude SARS-Cov-2 infection and should not be used as the sole basis for  treatment or other patient management decisions. A negative result may occur with  improper specimen collection/handling, submission of specimen other than nasopharyngeal swab, presence of viral mutation(s) within the areas targeted by this assay, and inadequate number of viral copies(<138 copies/mL). A negative result must be combined with clinical observations, patient history, and epidemiological information. The expected result is Negative.  Fact Sheet for Patients:  BloggerCourse.com  Fact Sheet for Healthcare Providers:  SeriousBroker.it  This test is no t yet approved or cleared by the [place] FDA and  has been authorized for detection and/or diagnosis of SARS-CoV-2 by FDA under an Emergency Use Authorization (EUA). This EUA will remain  in effect (meaning this test can be used) for the duration of the COVID-19 declaration under Section 564(b)(1) of the Act, 21 U.S.C.section 360bbb-3(b)(1), unless the authorization is terminated  or revoked sooner.       Influenza A by PCR NEGATIVE NEGATIVE Final   Influenza B by PCR NEGATIVE NEGATIVE Final    Comment: (NOTE) The Xpert Xpress SARS-CoV-2/FLU/RSV plus assay is intended as an aid in the diagnosis of influenza from Nasopharyngeal swab specimens and should not be used as a sole basis for treatment. Nasal washings and aspirates are unacceptable for Xpert Xpress SARS-CoV-2/FLU/RSV testing.  Fact Sheet for Patients: BloggerCourse.com  Fact Sheet for Healthcare Providers: SeriousBroker.it  This test is not yet approved or cleared by the [place] FDA and has been authorized for detection and/or diagnosis of SARS-CoV-2 by FDA under an Emergency Use Authorization (EUA). This EUA will remain in effect (meaning this test can be used) for the duration of the COVID-19 declaration under Section 564(b)(1) of the Act, 21  U.S.C. section 360bbb-3(b)(1), unless the authorization is terminated or revoked.  Performed at [facility], [address] [place], [place] [address]     Studies/Results: No results found.  Medications: Scheduled Meds: . enoxaparin (LOVENOX) injection  40 mg Subcutaneous Q24H  . folic acid  1 mg Oral Daily  . HYDROmorphone   Intravenous Q4H  . ketorolac  15 mg Intravenous Q6H  . oxyCODONE  20 mg Oral Q12H  . senna-docusate  1 tablet Oral BID   Continuous Infusions: . dextrose 5 % and 0.45% NaCl 125 mL/hr at 02/12/20 1532  . diphenhydrAMINE     PRN Meds:.diphenhydrAMINE **OR** diphenhydrAMINE, naloxone **AND** sodium chloride flush, ondansetron (ZOFRAN) IV, oxyCODONE, polyethylene glycol, tiZANidine  Consultants:  None  Procedures:  None  Antibiotics:  None  Assessment/Plan: Principal Problem:   Sickle cell pain crisis (HCC) Active Problems:   Morbidly obese (HCC)   Chronic pain syndrome  1. Hb Sickle Cell Disease with crisis: Decrease IVF rate to 46mls/hour, continue weight based Dilaudid PCA at 0.6/10/4, continue IV Toradol 15 mg Q 6 H for total of 5 days, continue oral home pain medications. Monitor vitals very closely, Re-evaluate pain scale regularly, 2 L of Oxygen by [place]. 2.  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 There is no clinical indication for blood transfusion today. Will monitor closely and transfuse as needed. 3. Chronic pain Syndrome: Continue home medications as ordered. 4. Morbid obesity: Patient counseled extensively about nutrition and exercise. Heart healthy diet recommended.  Code Status: Full Code Family Communication: N/A Disposition Plan: Not yet ready for discharge  Lauriann Milillo  If 7PM-7AM, please contact night-coverage.  02/12/2020, 3:37 PM  LOS: 1 day

## 2020-02-13 DIAGNOSIS — D5700 Hb-SS disease with crisis, unspecified: Secondary | ICD-10-CM | POA: Diagnosis not present

## 2020-02-13 MED ORDER — HYDROMORPHONE 1 MG/ML IV SOLN
INTRAVENOUS | Status: DC
  Administered 2020-02-13: 7.49 mL via INTRAVENOUS
  Administered 2020-02-13: 8.4 mg via INTRAVENOUS
  Administered 2020-02-14: 5.4 mg via INTRAVENOUS
  Administered 2020-02-14: 2.4 mg via INTRAVENOUS
  Administered 2020-02-14: 3 mg via INTRAVENOUS
  Administered 2020-02-14: 7.2 mg via INTRAVENOUS
  Filled 2020-02-13 (×2): qty 30

## 2020-02-13 NOTE — Progress Notes (Signed)
Subjective: Stephanie Burch is a 40 year old female with a medical history significant for sickle cell disease, chronic pain syndrome, opiate dependence and tolerance, morbid obesity, history of depression and anxiety, and history of anemia of chronic disease was admitted for sickle cell pain crisis.  Patient says that pain has improved minimally overnight.  She rates her pain as 7/10.  She feels that medication was not very effective overnight.  Patient states that she is not at a point where she can return home and manage pain.  Pain is primarily to lower extremities.  She denies any fever, headache, cough, urinary symptoms, nausea, vomiting, or diarrhea.  Objective:  Vital signs in last 24 hours:  Vitals:   02/13/20 0545 02/13/20 0829 02/13/20 1005 02/13/20 1158  BP:   127/75   Pulse:   82   Resp: 14 14 16 16   Temp:   97.8 F (36.6 C)   TempSrc:   Oral   SpO2: 99% 99% 100% 100%  Weight:      Height:        Intake/Output from previous day:   Intake/Output Summary (Last 24 hours) at 02/13/2020 1320 Last data filed at 02/13/2020 0857 Gross per 24 hour  Intake 3867.1 ml  Output 2150 ml  Net 1717.1 ml    Physical Exam: General: Alert, awake, oriented x3, in no acute distress.  HEENT: [place]/AT PEERL, EOMI Neck: Trachea midline,  no masses, no thyromegal,y no JVD, no carotid bruit OROPHARYNX:  Moist, No exudate/ erythema/lesions.  Heart: Regular rate and rhythm, without murmurs, rubs, gallops, PMI non-displaced, no heaves or thrills on palpation.  Lungs: Clear to auscultation, no wheezing or rhonchi noted. No increased vocal fremitus resonant to percussion  Abdomen: Soft, nontender, nondistended, positive bowel sounds, no masses no hepatosplenomegaly noted..  Neuro: No focal neurological deficits noted cranial nerves II through XII grossly intact. DTRs 2+ bilaterally upper and lower extremities. Strength 5 out of 5 in bilateral upper and lower extremities. Musculoskeletal: No warm  swelling or erythema around joints, no spinal tenderness noted. Psychiatric: Patient alert and oriented x3, good insight and cognition, good recent to remote recall. Lymph node survey: No cervical axillary or inguinal lymphadenopathy noted.  Lab Results:  Basic Metabolic Panel:    Component Value Date/Time   NA 141 02/10/2020 1759   NA 142 09/11/2017 1431   K 3.9 02/10/2020 1759   CL 107 02/10/2020 1759   CO2 24 02/10/2020 1759   BUN 12 02/10/2020 1759   BUN 8 09/11/2017 1431   CREATININE 0.76 02/10/2020 1759   GLUCOSE 103 (H) 02/10/2020 1759   CALCIUM 10.0 02/10/2020 1759   CBC:    Component Value Date/Time   WBC 9.1 02/12/2020 0711   HGB 10.9 (L) 02/12/2020 0711   HGB 13.0 09/11/2017 1431   HCT 31.6 (L) 02/12/2020 0711   HCT 38.4 09/11/2017 1431   PLT 196 02/12/2020 0711   PLT 258 09/11/2017 1431   MCV 73.1 (L) 02/12/2020 0711   MCV 75 (L) 09/11/2017 1431   NEUTROABS 6.0 02/12/2020 0711   NEUTROABS 3.9 09/11/2017 1431   LYMPHSABS 2.0 02/12/2020 0711   LYMPHSABS 2.2 09/11/2017 1431   MONOABS 0.7 02/12/2020 0711   EOSABS 0.4 02/12/2020 0711   EOSABS 0.1 09/11/2017 1431   BASOSABS 0.0 02/12/2020 0711   BASOSABS 0.0 09/11/2017 1431    Recent Results (from the past 240 hour(s))  Resp Panel by RT-PCR (Flu A&B, Covid) Nasopharyngeal Swab     Status: None   Collection Time:  02/10/20  8:50 PM   Specimen: Nasopharyngeal Swab; Nasopharyngeal(NP) swabs in vial transport medium  Result Value Ref Range Status   SARS Coronavirus 2 by RT PCR NEGATIVE NEGATIVE Final    Comment: (NOTE) SARS-CoV-2 target nucleic acids are NOT DETECTED.  The SARS-CoV-2 RNA is generally detectable in upper respiratory specimens during the acute phase of infection. The lowest concentration of SARS-CoV-2 viral copies this assay can detect is 138 copies/mL. A negative result does not preclude SARS-Cov-2 infection and should not be used as the sole basis for treatment or other patient management  decisions. A negative result may occur with  improper specimen collection/handling, submission of specimen other than nasopharyngeal swab, presence of viral mutation(s) within the areas targeted by this assay, and inadequate number of viral copies(<138 copies/mL). A negative result must be combined with clinical observations, patient history, and epidemiological information. The expected result is Negative.  Fact Sheet for Patients:  BloggerCourse.com  Fact Sheet for Healthcare Providers:  SeriousBroker.it  This test is no t yet approved or cleared by the [place] FDA and  has been authorized for detection and/or diagnosis of SARS-CoV-2 by FDA under an Emergency Use Authorization (EUA). This EUA will remain  in effect (meaning this test can be used) for the duration of the COVID-19 declaration under Section 564(b)(1) of the Act, 21 U.S.C.section 360bbb-3(b)(1), unless the authorization is terminated  or revoked sooner.       Influenza A by PCR NEGATIVE NEGATIVE Final   Influenza B by PCR NEGATIVE NEGATIVE Final    Comment: (NOTE) The Xpert Xpress SARS-CoV-2/FLU/RSV plus assay is intended as an aid in the diagnosis of influenza from Nasopharyngeal swab specimens and should not be used as a sole basis for treatment. Nasal washings and aspirates are unacceptable for Xpert Xpress SARS-CoV-2/FLU/RSV testing.  Fact Sheet for Patients: BloggerCourse.com  Fact Sheet for Healthcare Providers: SeriousBroker.it  This test is not yet approved or cleared by the [place] FDA and has been authorized for detection and/or diagnosis of SARS-CoV-2 by FDA under an Emergency Use Authorization (EUA). This EUA will remain in effect (meaning this test can be used) for the duration of the COVID-19 declaration under Section 564(b)(1) of the Act, 21 U.S.C. section 360bbb-3(b)(1), unless the  authorization is terminated or revoked.  Performed at [facility], [address], [place], [place] [address]     Studies/Results: No results found.  Medications: Scheduled Meds: . enoxaparin (LOVENOX) injection  40 mg Subcutaneous Q24H  . folic acid  1 mg Oral Daily  . HYDROmorphone   Intravenous Q4H  . ketorolac  15 mg Intravenous Q6H  . oxyCODONE  20 mg Oral Q12H  . senna-docusate  1 tablet Oral BID   Continuous Infusions: . dextrose 5 % and 0.45% NaCl 125 mL/hr at 02/13/20 0251  . diphenhydrAMINE     PRN Meds:.diphenhydrAMINE **OR** diphenhydrAMINE, naloxone **AND** sodium chloride flush, ondansetron (ZOFRAN) IV, oxyCODONE, polyethylene glycol, tiZANidine  Consultants:  None  Procedures:  None  Antibiotics:  None  Assessment/Plan: Principal Problem:   Sickle cell pain crisis (HCC) Active Problems:   Morbidly obese (HCC)   Chronic pain syndrome   Sickle cell disease with pain crisis: Continue IV fluids at [facility] Weaning IV Dilaudid PCA, settings decreased to 0.5 mg, 10-minute lockout, and 3 mg/h. Oxycodone 20 mg every 4 hours as needed for severe breakthrough pain Toradol 15 mg IV every 6 hours for a total of 5 days Monitor vital signs very closely, reevaluate pain  scale regularly, and supplemental oxygen as needed.  Sickle cell anemia: Hemoglobin is stable and consistent with patient's baseline.  There is no clinical indication for blood transfusion at this time.  Continue to follow closely.  CBC in AM.  Chronic pain syndrome: Continue home medications  Morbid obesity: Patient counseled extensively on the importance of reducing overall calorie intake.  Recommend low-fat, low carbohydrate diet divided over small meals throughout the day.  Discussed weight management in relation to pain control.  Patient has bilateral knee pain that has been worsening.  Her current BMI is 47.  Definitely recommend weight loss.  Patient may warrant  assistance from dietitian as an outpatient.   Code Status: Full Code Family Communication: N/A Disposition Plan: Not yet ready for discharge  Rasheema Truluck Rennis Petty  APRN, MSN, FNP-C [facility] [address] [place], [place] [address] [phone]  If 5PM-8AM, please contact night-coverage.  02/13/2020, 1:20 PM  LOS: 2 days

## 2020-02-14 DIAGNOSIS — D5700 Hb-SS disease with crisis, unspecified: Secondary | ICD-10-CM | POA: Diagnosis not present

## 2020-02-14 LAB — CBC
HCT: 29.7 % — ABNORMAL LOW (ref 36.0–46.0)
Hemoglobin: 10.2 g/dL — ABNORMAL LOW (ref 12.0–15.0)
MCH: 25.1 pg — ABNORMAL LOW (ref 26.0–34.0)
MCHC: 34.3 g/dL (ref 30.0–36.0)
MCV: 73 fL — ABNORMAL LOW (ref 80.0–100.0)
Platelets: 183 10*3/uL (ref 150–400)
RBC: 4.07 MIL/uL (ref 3.87–5.11)
RDW: 13.8 % (ref 11.5–15.5)
WBC: 9.2 10*3/uL (ref 4.0–10.5)
nRBC: 0.8 % — ABNORMAL HIGH (ref 0.0–0.2)

## 2020-02-14 LAB — BASIC METABOLIC PANEL
Anion gap: 6 (ref 5–15)
BUN: 10 mg/dL (ref 6–20)
CO2: 24 mmol/L (ref 22–32)
Calcium: 8.9 mg/dL (ref 8.9–10.3)
Chloride: 111 mmol/L (ref 98–111)
Creatinine, Ser: 0.68 mg/dL (ref 0.44–1.00)
GFR, Estimated: >60 mL/min (ref >60)
Glucose, Bld: 107 mg/dL — ABNORMAL HIGH (ref 70–99)
Potassium: 3.6 mmol/L (ref 3.5–5.1)
Sodium: 141 mmol/L (ref 135–145)

## 2020-02-14 MED ORDER — HYDROMORPHONE HCL 2 MG/ML IJ SOLN
2.0000 mg | INTRAMUSCULAR | Status: DC | PRN
Start: 2020-02-14 — End: 2020-02-15
  Administered 2020-02-14 – 2020-02-15 (×9): 2 mg via INTRAVENOUS
  Filled 2020-02-14 (×9): qty 1

## 2020-02-14 NOTE — Plan of Care (Signed)
Plan of Care reviewed. Progressing as expected towards identified goals. 

## 2020-02-14 NOTE — Progress Notes (Signed)
Subjective:  Stephanie Burch is a 40 year old female with a medical history significant for sickle cell disease, chronic pain syndrome, opiate dependence and tolerance, morbid obesity, history of depression and anxiety, and history of anemia of chronic disease was admitted for sickle cell pain crisis.  Patient states that pain has not improved overnight.  She rates pain as 8/10.  She feels as though medications are not effective.  She denies any fever, headache, cough, urinary symptoms, nausea, vomiting, or diarrhea. Objective:  Vital signs in last 24 hours:  Vitals:   02/14/20 0408 02/14/20 0422 02/14/20 0824 02/14/20 1404  BP: 118/83   133/89  Pulse: 93   92  Resp: 18 14 14 15   Temp: 98 F (36.7 C)   98.2 F (36.8 C)  TempSrc: Oral   Oral  SpO2:  100% 97% 100%  Weight:      Height:        Intake/Output from previous day:   Intake/Output Summary (Last 24 hours) at 02/14/2020 1634 Last data filed at 02/14/2020 1400 Gross per 24 hour  Intake 480 ml  Output 300 ml  Net 180 ml    Physical Exam: General: Alert, awake, oriented x3, in no acute distress.  HEENT: [place]/AT PEERL, EOMI Neck: Trachea midline,  no masses, no thyromegal,y no JVD, no carotid bruit OROPHARYNX:  Moist, No exudate/ erythema/lesions.  Heart: Regular rate and rhythm, without murmurs, rubs, gallops, PMI non-displaced, no heaves or thrills on palpation.  Lungs: Clear to auscultation, no wheezing or rhonchi noted. No increased vocal fremitus resonant to percussion  Abdomen: Soft, nontender, nondistended, positive bowel sounds, no masses no hepatosplenomegaly noted..  Neuro: No focal neurological deficits noted cranial nerves II through XII grossly intact. DTRs 2+ bilaterally upper and lower extremities. Strength 5 out of 5 in bilateral upper and lower extremities. Musculoskeletal: No warm swelling or erythema around joints, no spinal tenderness noted. Psychiatric: Patient alert and oriented x3, good insight and  cognition, good recent to remote recall. Lymph node survey: No cervical axillary or inguinal lymphadenopathy noted.  Lab Results:  Basic Metabolic Panel:    Component Value Date/Time   NA 141 02/14/2020 0604   NA 142 09/11/2017 1431   K 3.6 02/14/2020 0604   CL 111 02/14/2020 0604   CO2 24 02/14/2020 0604   BUN 10 02/14/2020 0604   BUN 8 09/11/2017 1431   CREATININE 0.68 02/14/2020 0604   GLUCOSE 107 (H) 02/14/2020 0604   CALCIUM 8.9 02/14/2020 0604   CBC:    Component Value Date/Time   WBC 9.2 02/14/2020 0604   HGB 10.2 (L) 02/14/2020 0604   HGB 13.0 09/11/2017 1431   HCT 29.7 (L) 02/14/2020 0604   HCT 38.4 09/11/2017 1431   PLT 183 02/14/2020 0604   PLT 258 09/11/2017 1431   MCV 73.0 (L) 02/14/2020 0604   MCV 75 (L) 09/11/2017 1431   NEUTROABS 6.0 02/12/2020 0711   NEUTROABS 3.9 09/11/2017 1431   LYMPHSABS 2.0 02/12/2020 0711   LYMPHSABS 2.2 09/11/2017 1431   MONOABS 0.7 02/12/2020 0711   EOSABS 0.4 02/12/2020 0711   EOSABS 0.1 09/11/2017 1431   BASOSABS 0.0 02/12/2020 0711   BASOSABS 0.0 09/11/2017 1431    Recent Results (from the past 240 hour(s))  Resp Panel by RT-PCR (Flu A&B, Covid) Nasopharyngeal Swab     Status: None   Collection Time: 02/10/20  8:50 PM   Specimen: Nasopharyngeal Swab; Nasopharyngeal(NP) swabs in vial transport medium  Result Value Ref Range Status  SARS Coronavirus 2 by RT PCR NEGATIVE NEGATIVE Final    Comment: (NOTE) SARS-CoV-2 target nucleic acids are NOT DETECTED.  The SARS-CoV-2 RNA is generally detectable in upper respiratory specimens during the acute phase of infection. The lowest concentration of SARS-CoV-2 viral copies this assay can detect is 138 copies/mL. A negative result does not preclude SARS-Cov-2 infection and should not be used as the sole basis for treatment or other patient management decisions. A negative result may occur with  improper specimen collection/handling, submission of specimen other than  nasopharyngeal swab, presence of viral mutation(s) within the areas targeted by this assay, and inadequate number of viral copies(<138 copies/mL). A negative result must be combined with clinical observations, patient history, and epidemiological information. The expected result is Negative.  Fact Sheet for Patients:  BloggerCourse.com  Fact Sheet for Healthcare Providers:  SeriousBroker.it  This test is no t yet approved or cleared by the [place] FDA and  has been authorized for detection and/or diagnosis of SARS-CoV-2 by FDA under an Emergency Use Authorization (EUA). This EUA will remain  in effect (meaning this test can be used) for the duration of the COVID-19 declaration under Section 564(b)(1) of the Act, 21 U.S.C.section 360bbb-3(b)(1), unless the authorization is terminated  or revoked sooner.       Influenza A by PCR NEGATIVE NEGATIVE Final   Influenza B by PCR NEGATIVE NEGATIVE Final    Comment: (NOTE) The Xpert Xpress SARS-CoV-2/FLU/RSV plus assay is intended as an aid in the diagnosis of influenza from Nasopharyngeal swab specimens and should not be used as a sole basis for treatment. Nasal washings and aspirates are unacceptable for Xpert Xpress SARS-CoV-2/FLU/RSV testing.  Fact Sheet for Patients: BloggerCourse.com  Fact Sheet for Healthcare Providers: SeriousBroker.it  This test is not yet approved or cleared by the [place] FDA and has been authorized for detection and/or diagnosis of SARS-CoV-2 by FDA under an Emergency Use Authorization (EUA). This EUA will remain in effect (meaning this test can be used) for the duration of the COVID-19 declaration under Section 564(b)(1) of the Act, 21 U.S.C. section 360bbb-3(b)(1), unless the authorization is terminated or revoked.  Performed at [facility], [address], [place], [place] [address]     Studies/Results: No results found.  Medications: Scheduled Meds: . enoxaparin (LOVENOX) injection  40 mg Subcutaneous Q24H  . folic acid  1 mg Oral Daily  . ketorolac  15 mg Intravenous Q6H  . oxyCODONE  20 mg Oral Q12H  . senna-docusate  1 tablet Oral BID   Continuous Infusions: . diphenhydrAMINE     PRN Meds:.diphenhydrAMINE **OR** diphenhydrAMINE, HYDROmorphone (DILAUDID) injection, ondansetron (ZOFRAN) IV, oxyCODONE, polyethylene glycol, tiZANidine  Consultants:  None  Procedures:  None  Antibiotics:  None  Assessment/Plan: Principal Problem:   Sickle cell pain crisis (HCC) Active Problems:   Morbidly obese (HCC)   Chronic pain syndrome  Sickle cell disease with pain crisis: Patient states that pain persists despite IV Dilaudid PCA.  She reports no relief.  Discontinue IV Dilaudid PCA.  Dilaudid 2 mg IV every 2 hours as needed Oxycodone 20 mg every 4 hours as needed for severe breakthrough pain Continue Toradol 15 mg IV every 6 hours for total of 5 days Monitor vital signs very closely, reevaluate pain scale regularly, and supplemental oxygen as needed. Discharge planned for 02/15/2020.  Sickle cell anemia: Hemoglobin is stable and consistent with patient's baseline.  There is no clinical indication for blood transfusion at this time.  Continue to follow closely.  CBC in AM.  Chronic pain syndrome: Continue home medications  Morbid obesity: Patient counseled extensively on the importance of reducing overall calorie intake.  Recommended low-fat, low carbohydrate diet better with small meals throughout the day.  Continue heart healthy diet.    Code Status: Full Code Family Communication: N/A Disposition Plan: Not yet ready for discharge  Martie Fulgham Rennis Petty  APRN, MSN, FNP-C [facility] [address] [place], [place] [address] [phone]  If 5PM-8AM, please contact  night-coverage.  02/14/2020, 4:35 PM  LOS: 3 days

## 2020-02-15 ENCOUNTER — Telehealth: Payer: Self-pay | Admitting: Family Medicine

## 2020-02-15 ENCOUNTER — Other Ambulatory Visit: Payer: Self-pay | Admitting: Family Medicine

## 2020-02-15 ENCOUNTER — Encounter: Payer: Self-pay | Admitting: Family Medicine

## 2020-02-15 DIAGNOSIS — D571 Sickle-cell disease without crisis: Secondary | ICD-10-CM

## 2020-02-15 DIAGNOSIS — F1190 Opioid use, unspecified, uncomplicated: Secondary | ICD-10-CM

## 2020-02-15 DIAGNOSIS — G894 Chronic pain syndrome: Secondary | ICD-10-CM | POA: Diagnosis not present

## 2020-02-15 DIAGNOSIS — D5700 Hb-SS disease with crisis, unspecified: Secondary | ICD-10-CM | POA: Diagnosis not present

## 2020-02-15 MED ORDER — OXYCODONE HCL 20 MG PO TABS
20.0000 mg | ORAL_TABLET | ORAL | 0 refills | Status: DC | PRN
Start: 2020-02-15 — End: 2020-02-28

## 2020-02-15 MED ORDER — XTAMPZA ER 18 MG PO C12A: 18.0000 mg | EXTENDED_RELEASE_CAPSULE | Freq: Two times a day (BID) | ORAL | 0 refills | Status: DC

## 2020-02-15 NOTE — Telephone Encounter (Signed)
Message routed to PCP Stroud, [name], FNP . Please Advise.  

## 2020-02-15 NOTE — Discharge Summary (Signed)
Physician Discharge Summary  Stephanie Burch VQQ:[id number] DOB: 03/05/1980 DOA: 02/10/2020  PCP: Kallie Locks, FNP  Admit date: 02/10/2020  Discharge date: 02/15/2020  Discharge Diagnoses:  Principal Problem:   Sickle cell pain crisis [facility]) Active Problems:   Morbidly obese (HCC)   Chronic pain syndrome   Discharge Condition: Stable  Disposition:   Follow-up Information    Schedule an appointment as soon as possible for a visit  with Kallie Locks, FNP.   Specialty: Family Medicine Contact information: [address] [place] [address] [phone]        [facility]-EM[facility]GENCY [facility]PT.   Specialty: Emergency Medicine Why: If symptoms worsen Contact information: [id number] [place] [address] [phone]       Massie Maroon, FNP Follow up.   Specialty: Family Medicine Contact information: [address] [place] [address] [phone]        Massie Maroon, FNP .   Specialty: Family Medicine Contact information: [address] [place] [address] [phone]              Pt is discharged home in good condition and is to follow up with Kallie Locks, FNP this week to have labs evaluated. Stephanie Burch is instructed to increase activity slowly and balance with rest for the next few days, and use prescribed medication to complete treatment of pain  Diet: Regular Wt Readings from Last 3 Encounters:  02/11/20 128.4 kg  01/14/20 (!) 138 kg  01/06/20 135 kg    History of present illness:  Stephanie Burch is a 40 year old female with a medical history significant for sickle cell anemia, chronic pain syndrome with opiate dependence, obesity who presents to the emergency department for evaluation of sickle cell pain.  Patient states that she has been having ongoing bilateral lower extremity pain over the past 3 days that is typical of her sickle cell pain.  She has  been having to increase her home medications, but has not had any significant improvement therefore came to the ER for further evaluation.  She is not currently not having any upper extremity pain.  She denies any subjective fevers, chills, diaphoresis, chest pain, palpitations, dyspnea, abdominal pain, nausea, dysuria, diarrhea, or swelling of her lower extremities.  ER course: Initial vital signs showed BP 156/100, pulse 109, RR 18, temperature 99.3 F, oxygen saturation 97% on RA.  Labs are notable for WBCs 8.3, hemoglobin 13.2, hematocrit 38.8, platelets 285,000, sodium 141, potassium 3.9, bicarb 24, BUN 12, creatinine 0.76, serum glucose 103, reticulocytes 2.9%, beta-hCG less than 5.0.  COVID 19 panel was obtained and pending.  Patient was given Oxy IR 15 mg orally, IV Dilaudid 2 mg x 2, Zofran, and IV fluids without significant improvement.  The hospitalist service was consulted to admit for further evaluation and management.  Hospital Course:  Sickle cell disease with pain crisis: Patient was admitted for sickle cell pain crisis and managed appropriately with IVF, IV Dilaudid via PCA and IV Toradol, as well as other adjunct therapies per sickle cell pain management protocols.  IV Dilaudid PCA was weaned appropriately and patient was transitioned to home medications.  Patient is highly opiate tolerant.  She has had frequent hospitalizations and sickle cell pain crises over the past 6 months.  Additional disease modifying agents for better sickle cell disease control was advised.  Patient to start Adakveo infusions in clinic in the next several weeks.  Monthly transfusion  discussed with patient at length.  Patient expressed understanding.  We will contact patient within the next several days to schedule. Pain intensity is 6/10.  Patient will discharge home on previously prescribed medications.  Advised to follow-up with PCP to repeat CBC with differential and for medication management in 1  week. Patient is alert, oriented, and ambulating without assistance.  Patient was therefore discharged home today in a hemodynamically stable condition.   Stephanie Burch will follow-up with PCP within 1 week of this discharge. Stephanie Burch was counseled extensively about nonpharmacologic means of pain management, patient verbalized understanding and was appreciative of  the care received during this admission.   We discussed the need for good hydration, monitoring of hydration status, avoidance of heat, cold, stress, and infection triggers.  Discussed the importance of taking folic acid 1 mg daily.  Patient has failed hydroxyurea therapy in the past.  Patient was reminded of the need to seek medical attention immediately if any symptom of bleeding, anemia, or infection occurs.  Discharge Exam: Vitals:   02/15/20 0443 02/15/20 0958  BP: 137/79 118/70  Pulse: (!) 103 85  Resp: 16 14  Temp: 98.2 F (36.8 C) 97.8 F (36.6 C)  SpO2: 95% 100%   Vitals:   02/14/20 2105 02/14/20 2357 02/15/20 0443 02/15/20 0958  BP: (!) 141/77 127/74 137/79 118/70  Pulse: 89 95 (!) 103 85  Resp: 16 16 16 14   Temp: 98.1 F (36.7 C) 99 F (37.2 C) 98.2 F (36.8 C) 97.8 F (36.6 C)  TempSrc: Oral Oral Oral Oral  SpO2: 99% 100% 95% 100%  Weight:      Height:        General appearance : Awake, alert, not in any distress. Speech Clear. Not toxic looking HEENT: Atraumatic and Normocephalic, pupils equally reactive to light and accomodation Neck: Supple, no JVD. No cervical lymphadenopathy.  Chest: Good air entry bilaterally, no added sounds  CVS: S1 S2 regular, no murmurs.  Abdomen: Bowel sounds present, Non tender and not distended with no gaurding, rigidity or rebound. Extremities: B/L Lower Ext shows no edema, both legs are warm to touch Neurology: Awake alert, and oriented X 3, CN II-XII intact, Non focal Skin: No Rash  Discharge Instructions  Discharge Instructions    Discharge patient   Complete by: As  directed    Discharge disposition: 01-Home or Self Care   Discharge patient date: 02/15/2020     Allergies as of 02/15/2020   No Known Allergies     Medication List    TAKE these medications   folic acid 1 MG tablet Commonly known as: FOLVITE Take 1 tablet (1 mg total) by mouth daily.   ibuprofen 800 MG tablet Commonly known as: ADVIL Take 1 tablet (800 mg total) by mouth every 8 (eight) hours as needed for headache, mild pain, moderate pain or cramping.   naloxone 0.4 MG/ML injection Commonly known as: NARCAN As needed   ondansetron 4 MG disintegrating tablet Commonly known as: Zofran ODT Take 1 tablet (4 mg total) by mouth every 8 (eight) hours as needed for nausea or vomiting.   ondansetron 4 MG tablet Commonly known as: ZOFRAN Take 4 mg by mouth every 8 (eight) hours as needed for nausea or vomiting.   Oxbryta 500 MG Tabs tablet Generic drug: voxelotor Take 1,500 mg by mouth daily.   Oxycodone HCl 20 MG Tabs Take 1 tablet (20 mg total) by mouth every 4 (four) hours as needed for up to 15 days (for pain).  tiZANidine 4 MG capsule Commonly known as: ZANAFLEX Take 1 capsule (4 mg total) by mouth 3 (three) times daily as needed for muscle spasms.   valACYclovir 500 MG tablet Commonly known as: Valtrex Take Valtrex 500 mg daily for suppression of outbreaks. Take Valtrex 1000 mg daily X 1 week for outbreaks. What changed:   how much to take  how to take this  when to take this  additional instructions   Vitamin D (Ergocalciferol) 1.25 MG (50000 UNIT) Caps capsule Commonly known as: DRISDOL Take 1 capsule (50,000 Units total) by mouth every 7 (seven) days. What changed: additional instructions   Xtampza ER 18 MG C12a Generic drug: oxyCODONE ER Take 18 mg by mouth every 12 (twelve) hours.       The results of significant diagnostics from this hospitalization (including imaging, microbiology, ancillary and laboratory) are listed below for reference.     Significant Diagnostic Studies: No results found.  Microbiology: Recent Results (from the past 240 hour(s))  Resp Panel by RT-PCR (Flu A&B, Covid) Nasopharyngeal Swab     Status: None   Collection Time: 02/10/20  8:50 PM   Specimen: Nasopharyngeal Swab; Nasopharyngeal(NP) swabs in vial transport medium  Result Value Ref Range Status   SARS Coronavirus 2 by RT PCR NEGATIVE NEGATIVE Final    Comment: (NOTE) SARS-CoV-2 target nucleic acids are NOT DETECTED.  The SARS-CoV-2 RNA is generally detectable in upper respiratory specimens during the acute phase of infection. The lowest concentration of SARS-CoV-2 viral copies this assay can detect is 138 copies/mL. A negative result does not preclude SARS-Cov-2 infection and should not be used as the sole basis for treatment or other patient management decisions. A negative result may occur with  improper specimen collection/handling, submission of specimen other than nasopharyngeal swab, presence of viral mutation(s) within the areas targeted by this assay, and inadequate number of viral copies(<138 copies/mL). A negative result must be combined with clinical observations, patient history, and epidemiological information. The expected result is Negative.  Fact Sheet for Patients:  BloggerCourse.comhttps://www.fda.gov/media/152166/download  Fact Sheet for Healthcare Providers:  SeriousBroker.ithttps://www.fda.gov/media/152162/download  This test is no t yet approved or cleared by the [place] FDA and  has been authorized for detection and/or diagnosis of SARS-CoV-2 by FDA under an Emergency Use Authorization (EUA). This EUA will remain  in effect (meaning this test can be used) for the duration of the COVID-19 declaration under Section 564(b)(1) of the Act, 21 U.S.C.section 360bbb-3(b)(1), unless the authorization is terminated  or revoked sooner.       Influenza A by PCR NEGATIVE NEGATIVE Final   Influenza B by PCR NEGATIVE NEGATIVE Final    Comment:  (NOTE) The Xpert Xpress SARS-CoV-2/FLU/RSV plus assay is intended as an aid in the diagnosis of influenza from Nasopharyngeal swab specimens and should not be used as a sole basis for treatment. Nasal washings and aspirates are unacceptable for Xpert Xpress SARS-CoV-2/FLU/RSV testing.  Fact Sheet for Patients: BloggerCourse.comhttps://www.fda.gov/media/152166/download  Fact Sheet for Healthcare Providers: SeriousBroker.ithttps://www.fda.gov/media/152162/download  This test is not yet approved or cleared by the [place] FDA and has been authorized for detection and/or diagnosis of SARS-CoV-2 by FDA under an Emergency Use Authorization (EUA). This EUA will remain in effect (meaning this test can be used) for the duration of the COVID-19 declaration under Section 564(b)(1) of the Act, 21 U.S.C. section 360bbb-3(b)(1), unless the authorization is terminated or revoked.  Performed at [facility], [address] [place], [place] [id number]  Labs: Basic Metabolic Panel: Recent Labs  Lab 02/10/20 1759 02/14/20 0604  NA 141 141  K 3.9 3.6  CL 107 111  CO2 24 24  GLUCOSE 103* 107*  BUN 12 10  CREATININE 0.76 0.68  CALCIUM 10.0 8.9   Liver Function Tests: No results for input(s): AST, ALT, ALKPHOS, BILITOT, PROT, ALBUMIN in the last 168 hours. No results for input(s): LIPASE, AMYLASE in the last 168 hours. No results for input(s): AMMONIA in the last 168 hours. CBC: Recent Labs  Lab 02/10/20 1759 02/12/20 0711 02/14/20 0604  WBC 8.3 9.1 9.2  NEUTROABS 5.8 6.0  --   HGB 13.2 10.9* 10.2*  HCT 38.8 31.6* 29.7*  MCV 73.1* 73.1* 73.0*  PLT 285 196 183   Cardiac Enzymes: No results for input(s): CKTOTAL, CKMB, CKMBINDEX, TROPONINI in the last 168 hours. BNP: Invalid input(s): POCBNP CBG: No results for input(s): GLUCAP in the last 168 hours.  Time coordinating discharge: 35 minutes  Signed: Nolon Nations  APRN, MSN, FNP-C Patient Care [facility] [address] [place], [place] [address] [phone]  [facility] 02/15/2020, 3:03 PM

## 2020-02-15 NOTE — Telephone Encounter (Signed)
Done

## 2020-02-15 NOTE — Telephone Encounter (Signed)
Pt called upset about her meds not being filled today she say she was discharged from Hospital today when I informed her that her meds where not due until 02/16/2020 Pt got very upset and started to debate with me about her meds being due 02/15/20 she hung up the phone

## 2020-02-21 ENCOUNTER — Other Ambulatory Visit: Payer: Self-pay | Admitting: Family Medicine

## 2020-02-21 ENCOUNTER — Telehealth: Payer: Self-pay | Admitting: Family Medicine

## 2020-02-21 ENCOUNTER — Encounter (HOSPITAL_COMMUNITY): Payer: Medicaid Other

## 2020-02-21 DIAGNOSIS — G894 Chronic pain syndrome: Secondary | ICD-10-CM

## 2020-02-21 DIAGNOSIS — D571 Sickle-cell disease without crisis: Secondary | ICD-10-CM

## 2020-02-21 DIAGNOSIS — F1190 Opioid use, unspecified, uncomplicated: Secondary | ICD-10-CM

## 2020-02-21 MED ORDER — XTAMPZA ER 18 MG PO C12A: 18.0000 mg | EXTENDED_RELEASE_CAPSULE | Freq: Two times a day (BID) | ORAL | 0 refills | Status: DC

## 2020-02-21 NOTE — Telephone Encounter (Signed)
Done

## 2020-02-23 ENCOUNTER — Encounter (HOSPITAL_COMMUNITY): Payer: Self-pay | Admitting: Family Medicine

## 2020-02-23 ENCOUNTER — Non-Acute Institutional Stay (HOSPITAL_COMMUNITY)
Admission: AD | Admit: 2020-02-23 | Discharge: 2020-02-23 | Disposition: A | Discharge status: Home / Self Care | Payer: Medicaid Other | Source: Ambulatory Visit | Attending: Family Medicine | Admitting: Family Medicine

## 2020-02-23 ENCOUNTER — Telehealth: Payer: Self-pay | Admitting: Family Medicine

## 2020-02-23 ENCOUNTER — Telehealth (HOSPITAL_COMMUNITY): Payer: Self-pay | Admitting: *Deleted

## 2020-02-23 DIAGNOSIS — D5700 Hb-SS disease with crisis, unspecified: Secondary | ICD-10-CM | POA: Insufficient documentation

## 2020-02-23 DIAGNOSIS — Z832 Family history of diseases of the blood and blood-forming organs and certain disorders involving the immune mechanism: Secondary | ICD-10-CM | POA: Insufficient documentation

## 2020-02-23 DIAGNOSIS — Z833 Family history of diabetes mellitus: Secondary | ICD-10-CM | POA: Diagnosis not present

## 2020-02-23 DIAGNOSIS — D638 Anemia in other chronic diseases classified elsewhere: Secondary | ICD-10-CM | POA: Diagnosis not present

## 2020-02-23 DIAGNOSIS — G894 Chronic pain syndrome: Secondary | ICD-10-CM | POA: Insufficient documentation

## 2020-02-23 DIAGNOSIS — F1120 Opioid dependence, uncomplicated: Secondary | ICD-10-CM | POA: Insufficient documentation

## 2020-02-23 DIAGNOSIS — Z791 Long term (current) use of non-steroidal anti-inflammatories (NSAID): Secondary | ICD-10-CM | POA: Insufficient documentation

## 2020-02-23 DIAGNOSIS — Z8249 Family history of ischemic heart disease and other diseases of the circulatory system: Secondary | ICD-10-CM | POA: Diagnosis not present

## 2020-02-23 LAB — COMPREHENSIVE METABOLIC PANEL
ALT: 14 U/L (ref 0–44)
AST: 13 U/L — ABNORMAL LOW (ref 15–41)
Albumin: 4.3 g/dL (ref 3.5–5.0)
Alkaline Phosphatase: 73 U/L (ref 38–126)
Anion gap: 10 (ref 5–15)
BUN: 8 mg/dL (ref 6–20)
CO2: 22 mmol/L (ref 22–32)
Calcium: 9.5 mg/dL (ref 8.9–10.3)
Chloride: 105 mmol/L (ref 98–111)
Creatinine, Ser: 0.63 mg/dL (ref 0.44–1.00)
GFR, Estimated: >60 mL/min (ref >60)
Glucose, Bld: 115 mg/dL — ABNORMAL HIGH (ref 70–99)
Potassium: 4.2 mmol/L (ref 3.5–5.1)
Sodium: 137 mmol/L (ref 135–145)
Total Bilirubin: 0.8 mg/dL (ref 0.3–1.2)
Total Protein: 7.9 g/dL (ref 6.5–8.1)

## 2020-02-23 LAB — CBC WITH DIFFERENTIAL/PLATELET
Abs Immature Granulocytes: 0.04 10*3/uL (ref 0.00–0.07)
Basophils Absolute: 0 10*3/uL (ref 0.0–0.1)
Basophils Relative: 0 %
Eosinophils Absolute: 0.1 10*3/uL (ref 0.0–0.5)
Eosinophils Relative: 1 %
HCT: 36.9 % (ref 36.0–46.0)
Hemoglobin: 12.5 g/dL (ref 12.0–15.0)
Immature Granulocytes: 0 %
Lymphocytes Relative: 17 %
Lymphs Abs: 1.7 10*3/uL (ref 0.7–4.0)
MCH: 24.3 pg — ABNORMAL LOW (ref 26.0–34.0)
MCHC: 33.9 g/dL (ref 30.0–36.0)
MCV: 71.7 fL — ABNORMAL LOW (ref 80.0–100.0)
Monocytes Absolute: 0.6 10*3/uL (ref 0.1–1.0)
Monocytes Relative: 6 %
Neutro Abs: 7.7 10*3/uL (ref 1.7–7.7)
Neutrophils Relative %: 76 %
Platelets: 287 10*3/uL (ref 150–400)
RBC: 5.15 MIL/uL — ABNORMAL HIGH (ref 3.87–5.11)
RDW: 14.6 % (ref 11.5–15.5)
WBC: 10.1 10*3/uL (ref 4.0–10.5)
nRBC: 0 % (ref 0.0–0.2)

## 2020-02-23 MED ORDER — KETOROLAC TROMETHAMINE 30 MG/ML IJ SOLN
15.0000 mg | Freq: Once | INTRAMUSCULAR | Status: AC
  Administered 2020-02-23: 15 mg via INTRAVENOUS
  Filled 2020-02-23: qty 1

## 2020-02-23 MED ORDER — HYDROMORPHONE HCL 2 MG/ML IJ SOLN
2.0000 mg | INTRAMUSCULAR | Status: AC
  Administered 2020-02-23 (×3): 2 mg via INTRAVENOUS
  Filled 2020-02-23 (×3): qty 1

## 2020-02-23 MED ORDER — OXYCODONE HCL 5 MG PO TABS
20.0000 mg | ORAL_TABLET | Freq: Once | ORAL | Status: AC
Start: 2020-02-23 — End: 2020-02-23
  Administered 2020-02-23: 20 mg via ORAL
  Filled 2020-02-23 (×2): qty 4

## 2020-02-23 MED ORDER — ONDANSETRON HCL 4 MG/2ML IJ SOLN: 4.0000 mg | Freq: Once | INTRAMUSCULAR | Status: DC

## 2020-02-23 MED ORDER — SODIUM CHLORIDE 0.45 % IV SOLN
INTRAVENOUS | Status: DC
Start: 2020-02-23 — End: 2020-02-23

## 2020-02-23 MED ORDER — ACETAMINOPHEN 500 MG PO TABS
1000.0000 mg | ORAL_TABLET | Freq: Once | ORAL | Status: AC
  Administered 2020-02-23: 1000 mg via ORAL
  Filled 2020-02-23: qty 2

## 2020-02-23 MED ORDER — HYDROMORPHONE HCL 4 MG PO TABS
4.0000 mg | ORAL_TABLET | Freq: Once | ORAL | Status: AC
  Administered 2020-02-23: 4 mg via ORAL
  Filled 2020-02-23: qty 1

## 2020-02-23 NOTE — Telephone Encounter (Signed)
Patient called requesting to come to the day hospital for sickle cell pain. Patient reports bilateral leg pain rated 8/10. Reports taking Oxycodone 20 mg 1 hour ago. Patient was recently discharged from the hospital last week. COVID-19 screening done and patient denies all symptoms and exposures. Denies fever, chest pain, nausea, vomiting, diarrhea and abdominal pain. Admits to having transportation without driving self. [place], FNP notified. Patient can come to the day hospital for pain management. Patient advised and expresses an understanding.

## 2020-02-23 NOTE — H&P (Signed)
[facility] History and Physical   Date: 02/23/2020  Patient name: Stephanie Burch Medical record number: [id number] Date of birth: 05/11/1980 Age: 40 y.o. Gender: female PCP: Kallie Locks, FNP  Attending physician: No att. providers found  Chief Complaint: Sickle cell pain  History of Present Illness: Stephanie Burch is a 40 year old female with a medical history significant for sickle cell disease, chronic pain syndrome, opiate dependence and tolerance, frequent hospitalizations, history of anemia of chronic disease, history of depression and anxiety, and history of morbid obesity presents with complaints of bilateral lower extremity pain that is consistent with previous pain crisis.  Patient states that she awakened with increased pain primarily to left lower extremity 2 days ago.  Pain has been unrelieved by home medications.  Pain is characterized as constant and aching.  She rates pain as 9/10.  She last had oxycodone this a.m. without sustained relief.  Patient denies any subjective fever, chills, cough, chest pain, shortness of breath, urinary symptoms, nausea, vomiting, or diarrhea.  No sick contacts, recent travel, or exposure to COVID-19.   Meds: No medications prior to admission.    Allergies: Patient has no known allergies. Past Medical History:  Diagnosis Date  . HSV infection   . Nausea   . Sickle cell anemia (HCC)   . Vitamin B12 deficiency 12/2018  . Vitamin D deficiency    History reviewed. No pertinent surgical history. Family History  Problem Relation Age of Onset  . Hypertension Mother   . Sickle cell trait Mother   . Diabetes Father   . Sickle cell trait Father    Social History   Socioeconomic History  . Marital status: Single    Spouse name: Not on file  . Number of children: Not on file  . Years of education: Not on file  . Highest education level: Not on file  Occupational History  . Not on file  Tobacco Use  . Smoking  status: Never Smoker  . Smokeless tobacco: Never Used  Vaping Use  . Vaping Use: Never used  Substance and Sexual Activity  . Alcohol use: Not Currently  . Drug use: Never  . Sexual activity: Yes    Birth control/protection: None  Other Topics Concern  . Not on file  Social History Narrative  . Not on file   Social Determinants of Health   Financial Resource Strain: Not on file  Food Insecurity: Not on file  Transportation Needs: Not on file  Physical Activity: Not on file  Stress: Not on file  Social Connections: Not on file  Intimate Partner Violence: Not on file   Review of Systems  Constitutional: Negative for chills and fever.  HENT: Negative.   Eyes: Negative.   Respiratory: Negative.   Cardiovascular: Negative.   Gastrointestinal: Negative.   Genitourinary: Negative.   Musculoskeletal: Positive for back pain and joint pain.  Skin: Negative.   Neurological: Negative.   Psychiatric/Behavioral: Negative.     Physical Exam: Blood pressure 120/62, pulse 91, temperature 97.9 F (36.6 C), temperature source Temporal, resp. rate 16, SpO2 100 %. Physical Exam Constitutional:      Appearance: Normal appearance.  Eyes:     Pupils: Pupils are equal, round, and reactive to light.  Cardiovascular:     Rate and Rhythm: Normal rate and regular rhythm.     Pulses: Normal pulses.  Pulmonary:     Effort: Pulmonary effort is normal.  Abdominal:     General: Abdomen is flat. Bowel  sounds are normal.  Skin:    General: Skin is warm.  Neurological:     General: No focal deficit present.     Mental Status: She is alert. Mental status is at baseline.  Psychiatric:        Mood and Affect: Mood normal.        Thought Content: Thought content normal.        Judgment: Judgment normal.      Lab results: Results for orders placed or performed during the hospital encounter of 02/23/20 (from the past 24 hour(s))  CBC with Differential/Platelet     Status: Abnormal    Collection Time: 02/23/20 10:00 AM  Result Value Ref Range   WBC 10.1 4.0 - 10.5 K/uL   RBC 5.15 (H) 3.87 - 5.11 MIL/uL   Hemoglobin 12.5 12.0 - 15.0 g/dL   HCT 36.6 44.0 - 34.7 %   MCV 71.7 (L) 80.0 - 100.0 fL   MCH 24.3 (L) 26.0 - 34.0 pg   MCHC 33.9 30.0 - 36.0 g/dL   RDW 42.5 95.6 - 38.7 %   Platelets 287 150 - 400 K/uL   nRBC 0.0 0.0 - 0.2 %   Neutrophils Relative % 76 %   Neutro Abs 7.7 1.7 - 7.7 K/uL   Lymphocytes Relative 17 %   Lymphs Abs 1.7 0.7 - 4.0 K/uL   Monocytes Relative 6 %   Monocytes Absolute 0.6 0.1 - 1.0 K/uL   Eosinophils Relative 1 %   Eosinophils Absolute 0.1 0.0 - 0.5 K/uL   Basophils Relative 0 %   Basophils Absolute 0.0 0.0 - 0.1 K/uL   Immature Granulocytes 0 %   Abs Immature Granulocytes 0.04 0.00 - 0.07 K/uL  Comprehensive metabolic panel     Status: Abnormal   Collection Time: 02/23/20 10:00 AM  Result Value Ref Range   Sodium 137 135 - 145 mmol/L   Potassium 4.2 3.5 - 5.1 mmol/L   Chloride 105 98 - 111 mmol/L   CO2 22 22 - 32 mmol/L   Glucose, Bld 115 (H) 70 - 99 mg/dL   BUN 8 6 - 20 mg/dL   Creatinine, Ser 5.64 0.44 - 1.00 mg/dL   Calcium 9.5 8.9 - 33.2 mg/dL   Total Protein 7.9 6.5 - 8.1 g/dL   Albumin 4.3 3.5 - 5.0 g/dL   AST 13 (L) 15 - 41 U/L   ALT 14 0 - 44 U/L   Alkaline Phosphatase 73 38 - 126 U/L   Total Bilirubin 0.8 0.3 - 1.2 mg/dL   GFR, Estimated >95 >18 mL/min   Anion gap 10 5 - 15    Imaging results:  No results found.   Assessment & Plan: Patient admitted to sickle cell day infusion center for management of pain crisis.  Patient is opiate tolerant Initiate IV dilaudid 2 mg X3 doses IV fluids, 0.45% saline at 100 ml/hr Toradol 15 mg IV times one dose Tylenol 1000 mg by mouth times one dose Review CBC with differential, complete metabolic panel, and reticulocytes as results become available.  Pain intensity will be reevaluated in context of functioning and relationship to baseline as care progresses If pain  intensity remains elevated and/or sudden change in hemodynamic stability transition to inpatient services for higher level of care.        Nolon Nations  APRN, MSN, FNP-C Patient Care [facility] [address] [place], [place] [address] [phone]  02/23/2020, 5:00 PM

## 2020-02-23 NOTE — BH Specialist Note (Signed)
Integrated Behavioral Health General Follow Up Note  02/23/2020 Name: Stephanie Burch MRN: [id number] DOB: 31-Dec-1980 Stephanie Burch is a 40 y.o. year old female who sees Kallie Locks, FNP for primary care. LCSW was initially consulted to support with stress precipitating sickle cell crisis and illness anxiety.  Interpreter: No.   Interpreter Name & Language: none  Assessment: Patient experiencing anxiety related to chronic illness. Patient also in need of transportation support.   Ongoing Intervention: CSW has been in contact with patient via phone this week to coordinate enrollment in [facility] transportation services. Patient initially requested assistance with ride to infusion appointment, but is in the process of rescheduling that appointment. Patient came to the day hospital today for sickle cell treatment via [facility] transportation. Visited briefly with patient at bedside in day hospital and reviewed transportation services. Advised patient on how to contact transportation services for future appointments.   Will follow up with patient at outpatient appointment 03/07/20 for support with behavioral health needs.  Review of patient status, including review of consultants reports, relevant laboratory and other test results, and collaboration with appropriate care team members and the patient's provider was performed as part of comprehensive patient evaluation and provision of services.     Follow up Plan: 1. CSW available from clinic as needed 2. Outpatient follow up 03/07/20  Stephanie Butts, LCSW [facility] [phone]

## 2020-02-23 NOTE — Discharge Instructions (Signed)
Sickle Cell Anemia, Adult  Sickle cell anemia is a condition where your red blood cells are shaped like sickles. Red blood cells carry oxygen through the body. Sickle-shaped cells do not live as long as normal red blood cells. They also clump together and block blood from flowing through the blood vessels. This prevents the body from getting enough oxygen. Sickle cell anemia causes organ damage and pain. It also increases the risk of infection. Follow these instructions at home: Medicines  Take over-the-counter and prescription medicines only as told by your doctor.  If you were prescribed an antibiotic medicine, take it as told by your doctor. Do not stop taking the antibiotic even if you start to feel better.  If you develop a fever, do not take medicines to lower the fever right away. Tell your doctor about the fever. Managing pain, stiffness, and swelling  Try these methods to help with pain: ? Use a heating pad. ? Take a warm bath. ? Distract yourself, such as by watching TV. Eating and drinking  Drink enough fluid to keep your pee (urine) clear or pale yellow. Drink more in hot weather and during exercise.  Limit or avoid alcohol.  Eat a healthy diet. Eat plenty of fruits, vegetables, whole grains, and lean protein.  Take vitamins and supplements as told by your doctor. Traveling  When traveling, keep these with you: ? Your medical information. ? The names of your doctors. ? Your medicines.  If you need to take an airplane, talk to your doctor first. Activity  Rest often.  Avoid exercises that make your heart beat much faster, such as jogging. General instructions  Do not use products that have nicotine or tobacco, such as cigarettes and e-cigarettes. If you need help quitting, ask your doctor.  Consider wearing a medical alert bracelet.  Avoid being in high places (high altitudes), such as mountains.  Avoid very hot or cold temperatures.  Avoid places where the  temperature changes a lot.  Keep all follow-up visits as told by your doctor. This is important. Contact a doctor if:  A joint hurts.  Your feet or hands hurt or swell.  You feel tired (fatigued). Get help right away if:  You have symptoms of infection. These include: ? Fever. ? Chills. ? Being very tired. ? Irritability. ? Poor eating. ? Throwing up (vomiting).  You feel dizzy or faint.  You have new stomach pain, especially on the left side.  You have a an erection (priapism) that lasts more than 4 hours.  You have numbness in your arms or legs.  You have a hard time moving your arms or legs.  You have trouble talking.  You have pain that does not go away when you take medicine.  You are short of breath.  You are breathing fast.  You have a long-term cough.  You have pain in your chest.  You have a bad headache.  You have a stiff neck.  Your stomach looks bloated even though you did not eat much.  Your skin is pale.  You suddenly cannot see well. Summary  Sickle cell anemia is a condition where your red blood cells are shaped like sickles.  Follow your doctor's advice on ways to manage pain, food to eat, activities to do, and steps to take for safe travel.  Get medical help right away if you have any signs of infection, such as a fever. This information is not intended to replace advice given to you by   your health care provider. Make sure you discuss any questions you have with your health care provider. Document Revised: 06/16/2019 Document Reviewed: 06/16/2019 Elsevier Patient Education  2021 [employer]  

## 2020-02-23 NOTE — Progress Notes (Signed)
Patient admitted to the day infusion hospital for sickle cell pain. Patient initially reported bilateral leg pain rated 8/10. For pain management, patient given a total of 6 mg Dilaudid IV, 4 mg Dilaudid PO, Tylenol, Toradol, Oxycodone and patient hydrated with IV fluids. At discharge, patient rated pain at 6/10. Vital signs stable. Patient alert, oriented and ambulatory at discharge.

## 2020-02-23 NOTE — Telephone Encounter (Signed)
   Stephanie Burch DOB: 1980-07-03 MRN: [id number]   RIDER WAIVER AND RELEASE OF LIABILITY  For purposes of improving physical access to our facilities, Stephanie Burch is pleased to partner with third parties to provide Stephanie Burch patients or other authorized individuals the option of convenient, on-demand ground transportation Burch (the Stephanie Burch") through use of the technology service that enables users to request on-demand ground transportation from independent third-party providers.  By opting to use and accept these Stephanie Burch, I, the undersigned, hereby agree on behalf of myself, and on behalf of any minor child using the Stephanie Burch for whom I am the parent or legal guardian, as follows:  1. Stephanie Burch provided to me are provided by independent third-party transportation providers who are not [employer] or employees and who are unaffiliated with [employer]. 2. Stephanie Burch is neither a transportation carrier nor a common or public carrier. 3. Stephanie Burch has no control over the quality or safety of the transportation that occurs as a result of the Stephanie Burch. 4. Stephanie Burch cannot guarantee that any third-party transportation provider will complete any arranged transportation service. 5. Stephanie Burch makes no representation, warranty, or guarantee regarding the reliability, timeliness, quality, safety, suitability, or availability of any of the Stephanie Burch or that they will be error free. 6. I fully understand that traveling by vehicle involves risks and dangers of serious bodily injury, including permanent disability, paralysis, and death. I agree, on behalf of myself and on behalf of any minor child using the Stephanie Burch for whom I am the parent or legal guardian, that the entire risk arising out of my use of the Stephanie Burch remains solely with me, to the maximum extent permitted under applicable law. 7. The Stephanie Burch are provided "as is" and "as available." Stephanie Burch disclaims all representations and warranties, express, implied or statutory, not expressly set out in these terms, including the implied warranties of merchantability and fitness for a particular purpose. 8. I hereby waive and release Stephanie Burch, its agents, employees, officers, directors, representatives, insurers, attorneys, assigns, successors, subsidiaries, and affiliates from any and all past, present, or future claims, demands, liabilities, actions, causes of action, or suits of any kind directly or indirectly arising from acceptance and use of the Stephanie Burch. 9. I further waive and release Stephanie Burch and its affiliates from all present and future liability and responsibility for any injury or death to persons or damages to property caused by or related to the use of the Stephanie Burch. 10. I have read this Waiver and Release of Liability, and I understand the terms used in it and their legal significance. This Waiver is freely and voluntarily given with the understanding that my right (as well as the right of any minor child for whom I am the parent or legal guardian using the Stephanie Burch) to legal recourse against Stephanie Burch in connection with the Stephanie Burch is knowingly surrendered in return for use of these Burch.   I attest that I read the consent document to Stephanie Burch, gave Stephanie Burch the opportunity to ask questions and answered the questions asked (if any). I affirm that Stephanie Burch then provided consent for she's participation in this program.     Stephanie Burch

## 2020-02-23 NOTE — Discharge Summary (Signed)
[facility] Discharge Summary   Patient ID: Stephanie Burch MRN: [id number] DOB/AGE: Jan 05, 1981 40 y.o.  Admit date: 02/23/2020 Discharge date: 02/23/2020  Primary Care Physician:  Kallie Locks, FNP  Admission Diagnoses:  Active Problems:   Sickle cell pain crisis [facility])   Discharge Medications:  Allergies as of 02/23/2020   No Known Allergies     Medication List    TAKE these medications   folic acid 1 MG tablet Commonly known as: FOLVITE Take 1 tablet (1 mg total) by mouth daily.   ibuprofen 800 MG tablet Commonly known as: ADVIL Take 1 tablet (800 mg total) by mouth every 8 (eight) hours as needed for headache, mild pain, moderate pain or cramping.   naloxone 0.4 MG/ML injection Commonly known as: NARCAN As needed   ondansetron 4 MG disintegrating tablet Commonly known as: Zofran ODT Take 1 tablet (4 mg total) by mouth every 8 (eight) hours as needed for nausea or vomiting.   ondansetron 4 MG tablet Commonly known as: ZOFRAN Take 4 mg by mouth every 8 (eight) hours as needed for nausea or vomiting.   Oxbryta 500 MG Tabs tablet Generic drug: voxelotor Take 1,500 mg by mouth daily.   Oxycodone HCl 20 MG Tabs Take 1 tablet (20 mg total) by mouth every 4 (four) hours as needed.   tiZANidine 4 MG capsule Commonly known as: ZANAFLEX Take 1 capsule (4 mg total) by mouth 3 (three) times daily as needed for muscle spasms.   valACYclovir 500 MG tablet Commonly known as: Valtrex Take Valtrex 500 mg daily for suppression of outbreaks. Take Valtrex 1000 mg daily X 1 week for outbreaks. What changed:   how much to take  how to take this  when to take this  additional instructions   Vitamin D (Ergocalciferol) 1.25 MG (50000 UNIT) Caps capsule Commonly known as: DRISDOL Take 1 capsule (50,000 Units total) by mouth every 7 (seven) days. What changed: additional instructions   Xtampza ER 18 MG C12a Generic drug: oxyCODONE ER Take 18 mg by  mouth every 12 (twelve) hours.   Xtampza ER 18 MG C12a Generic drug: oxyCODONE ER Take 18 mg by mouth every 12 (twelve) hours. Start taking on: February 24, 2020        Consults:  None  Significant Diagnostic Studies:  No results found.  History of present illness:  Stephanie Burch is a 40 year old female with a medical history significant for sickle cell disease, chronic pain syndrome, opiate dependence and tolerance, frequent hospitalizations, history of anemia of chronic disease, history of depression and anxiety, and history of morbid obesity presents with complaints of bilateral lower extremity pain that is consistent with previous pain crisis.  Patient states that she awakened with increased pain primarily to left lower extremity 2 days ago.  Pain has been unrelieved by home medications.  Pain is characterized as constant and aching.  She rates pain as 9/10.  She last had oxycodone this a.m. without sustained relief.  Patient denies any subjective fever, chills, cough, chest pain, shortness of breath, urinary symptoms, nausea, vomiting, or diarrhea.  No sick contacts, recent travel, or exposure to COVID-19. [facility] Course: Patient admitted to sickle cell day infusion clinic for management of pain crisis. Reviewed all laboratory values, consistent with patient's baseline. Pain managed with Dilaudid 2 mg IV x3 Dilaudid 4 mg by mouth x1 IV fluids, 0.45% saline at 100 mL/h Toradol 15 mg IV x1 Tylenol 1000 mg x 1 Oxycodone 20  mg x 1 Pain intensity decreased to 6/10.  Patient is requesting discharge home.  She does not warrant inpatient admission at this time.  Advised to resume home medications and follow-up with PCP for medication management.  Patient alert, oriented, and ambulating without assistance.  Patient was discharged home in a hemodynamically stable condition.  Discharge instructions: Resume all home medications.   Follow up with PCP as previously   scheduled.   Discussed the importance of drinking 64 ounces of water daily, dehydration of red blood cells may lead further sickling.   Avoid all stressors that precipitate sickle cell pain crisis.     The patient was given clear instructions to go to ER or return to medical center if symptoms do not improve, worsen or new problems develop.    Physical Exam at Discharge:  BP 120/62 (BP Location: Right Arm)    Pulse 91    Temp 97.9 F (36.6 C) (Temporal)    Resp 16    SpO2 100%   Physical Exam Constitutional:      Appearance: Normal appearance. She is obese.  Eyes:     Pupils: Pupils are equal, round, and reactive to light.  Cardiovascular:     Rate and Rhythm: Normal rate and regular rhythm.  Pulmonary:     Effort: Pulmonary effort is normal.  Abdominal:     General: Bowel sounds are normal.     Palpations: Abdomen is soft.  Musculoskeletal:        General: Normal range of motion.  Skin:    General: Skin is warm.  Neurological:     General: No focal deficit present.     Mental Status: She is alert. Mental status is at baseline.  Psychiatric:        Mood and Affect: Mood normal.        Thought Content: Thought content normal.        Judgment: Judgment normal.       Disposition at Discharge: Discharge disposition: 01-Home or Self Care       Discharge Orders: Discharge Instructions    Discharge patient   Complete by: As directed    Discharge disposition: 01-Home or Self Care   Discharge patient date: 02/23/2020      Condition at Discharge:   Stable  Time spent on Discharge:  Greater than 30 minutes.  Signed: Nolon Nations  APRN, MSN, FNP-C Patient Care [facility] [address] [place], [place] [address] [phone]  02/23/2020, 4:52 PM

## 2020-02-28 ENCOUNTER — Telehealth: Payer: Self-pay | Admitting: Family Medicine

## 2020-02-28 ENCOUNTER — Inpatient Hospital Stay (HOSPITAL_COMMUNITY)
Admission: RE | Admit: 2020-02-28 | Discharge: 2020-02-28 | Disposition: A | Discharge status: Home / Self Care | Payer: Medicaid Other | Source: Ambulatory Visit

## 2020-02-28 ENCOUNTER — Other Ambulatory Visit: Payer: Self-pay | Admitting: Family Medicine

## 2020-02-28 DIAGNOSIS — F1190 Opioid use, unspecified, uncomplicated: Secondary | ICD-10-CM

## 2020-02-28 DIAGNOSIS — D571 Sickle-cell disease without crisis: Secondary | ICD-10-CM

## 2020-02-28 DIAGNOSIS — G894 Chronic pain syndrome: Secondary | ICD-10-CM

## 2020-02-28 MED ORDER — OXYCODONE HCL 20 MG PO TABS: 20.0000 mg | ORAL_TABLET | ORAL | 0 refills | Status: DC | PRN

## 2020-02-28 NOTE — Telephone Encounter (Signed)
Done

## 2020-02-29 ENCOUNTER — Encounter: Payer: Self-pay | Admitting: Family Medicine

## 2020-03-06 ENCOUNTER — Inpatient Hospital Stay (HOSPITAL_COMMUNITY): Admission: RE | Admit: 2020-03-06 | Payer: Medicaid Other | Source: Ambulatory Visit

## 2020-03-07 ENCOUNTER — Ambulatory Visit: Payer: Self-pay | Admitting: Family Medicine

## 2020-03-07 ENCOUNTER — Ambulatory Visit: Payer: Self-pay | Admitting: Clinical

## 2020-03-08 ENCOUNTER — Ambulatory Visit (HOSPITAL_COMMUNITY)
Admission: RE | Admit: 2020-03-08 | Discharge: 2020-03-08 | Disposition: A | Discharge status: Home / Self Care | Payer: Medicaid Other | Source: Ambulatory Visit | Attending: Internal Medicine | Admitting: Internal Medicine

## 2020-03-08 DIAGNOSIS — D571 Sickle-cell disease without crisis: Secondary | ICD-10-CM | POA: Insufficient documentation

## 2020-03-08 MED ORDER — SODIUM CHLORIDE 0.9 % IV SOLN
INTRAVENOUS | Status: DC | PRN
  Administered 2020-03-08: 250 mL via INTRAVENOUS

## 2020-03-08 MED ORDER — SODIUM CHLORIDE 0.9 % IV SOLN
5.0000 mg/kg | Freq: Once | INTRAVENOUS | Status: AC
  Administered 2020-03-08: 625 mg via INTRAVENOUS
  Filled 2020-03-08: qty 62.5

## 2020-03-08 NOTE — Discharge Instructions (Signed)
Crizanlizumab injection What is this medicine? CRIZANLIZUMAB (KRIZ an LIZ ue mab) is a selectin blocker. This medicine is used to reduce how often sickle cell crises happen. This medicine may be used for other purposes; ask your health care provider or pharmacist if you have questions. COMMON BRAND NAME(S): ADAKVEO What should I tell my health care provider before I take this medicine? They need to know if you have any of these conditions:  an unusual or allergic reaction to crizanlizumab, other medicines, foods, dyes or preservatives  pregnant or trying to get pregnant  breast-feeding How should I use this medicine? This medicine is for infusion into a vein. It is given by a healthcare professional in a hospital or clinic setting. Talk to your pediatrician regarding the use of this medicine in children. While this drug may be prescribed for children as young as 16 years for selected conditions, precautions do apply. Overdosage: If you think you have taken too much of this medicine contact a poison control center or emergency room at once. NOTE: This medicine is only for you. Do not share this medicine with others. What if I miss a dose? Keep appointments for follow-up doses. It is important not to miss your dose. Call your doctor or healthcare professional if you are unable to keep an appointment. What may interact with this medicine? Interactions are not expected. This list may not describe all possible interactions. Give your health care provider a list of all the medicines, herbs, non-prescription drugs, or dietary supplements you use. Also tell them if you smoke, drink alcohol, or use illegal drugs. Some items may interact with your medicine. What should I watch for while using this medicine? Your condition will be monitored carefully while you are receiving this medicine. What side effects may I notice from receiving this medicine? Side effects that you should report to your doctor or  health care professional as soon as possible:  allergic reactions like skin rash, itching or hives; swelling of the face, lips, or tongue  signs and symptoms of an infusion reaction such as fever; chills or shivering; nausea or vomiting; tiredness; dizziness; sweating; shortness of breath or wheezing Side effects that usually do not require medical attention (report these to your doctor or health care professional if they continue or are bothersome):  back pain  diarrhea  joint pain  stomach pain This list may not describe all possible side effects. Call your doctor for medical advice about side effects. You may report side effects to FDA at 1-800-FDA-1088. Where should I keep my medicine? This medicine is given in a hospital or clinic and will not be stored at home. NOTE: This sheet is a summary. It may not cover all possible information. If you have questions about this medicine, talk to your doctor, pharmacist, or health care provider.  2021 Elsevier/Gold Standard (2017-12-23 09:49:06)  

## 2020-03-08 NOTE — Progress Notes (Signed)
PATIENT CARE CENTER NOTE  Diagnosis: Sickle Cell Anemia    Provider: Hollis, [place], FNP   Procedure: Adakveo infusion    Note: Patient received initial Adakveo infusion via PIV. Patient tolerated well with no adverse reaction. Observed patient for 30 minutes post-infusion. Vital signs stable.  Patient to come back in 2 weeks for next loading dose. Discharge instructions given. Patient alert, oriented and ambulatory at discharge.

## 2020-03-12 ENCOUNTER — Other Ambulatory Visit: Payer: Self-pay | Admitting: Family Medicine

## 2020-03-12 ENCOUNTER — Non-Acute Institutional Stay (HOSPITAL_COMMUNITY)
Admission: AD | Admit: 2020-03-12 | Discharge: 2020-03-12 | Disposition: A | Discharge status: Home / Self Care | Payer: Medicaid Other | Source: Ambulatory Visit | Attending: Internal Medicine | Admitting: Internal Medicine

## 2020-03-12 ENCOUNTER — Telehealth (HOSPITAL_COMMUNITY): Payer: Self-pay | Admitting: General Practice

## 2020-03-12 ENCOUNTER — Encounter: Payer: Self-pay | Admitting: Family Medicine

## 2020-03-12 DIAGNOSIS — F1120 Opioid dependence, uncomplicated: Secondary | ICD-10-CM | POA: Diagnosis not present

## 2020-03-12 DIAGNOSIS — Z832 Family history of diseases of the blood and blood-forming organs and certain disorders involving the immune mechanism: Secondary | ICD-10-CM | POA: Diagnosis not present

## 2020-03-12 DIAGNOSIS — Z791 Long term (current) use of non-steroidal anti-inflammatories (NSAID): Secondary | ICD-10-CM | POA: Insufficient documentation

## 2020-03-12 DIAGNOSIS — F1190 Opioid use, unspecified, uncomplicated: Secondary | ICD-10-CM

## 2020-03-12 DIAGNOSIS — D5700 Hb-SS disease with crisis, unspecified: Secondary | ICD-10-CM | POA: Diagnosis not present

## 2020-03-12 DIAGNOSIS — Z8249 Family history of ischemic heart disease and other diseases of the circulatory system: Secondary | ICD-10-CM | POA: Diagnosis not present

## 2020-03-12 DIAGNOSIS — Z79899 Other long term (current) drug therapy: Secondary | ICD-10-CM | POA: Insufficient documentation

## 2020-03-12 DIAGNOSIS — D638 Anemia in other chronic diseases classified elsewhere: Secondary | ICD-10-CM | POA: Diagnosis not present

## 2020-03-12 DIAGNOSIS — D571 Sickle-cell disease without crisis: Secondary | ICD-10-CM

## 2020-03-12 DIAGNOSIS — Z833 Family history of diabetes mellitus: Secondary | ICD-10-CM | POA: Insufficient documentation

## 2020-03-12 DIAGNOSIS — G894 Chronic pain syndrome: Secondary | ICD-10-CM | POA: Insufficient documentation

## 2020-03-12 LAB — COMPREHENSIVE METABOLIC PANEL
ALT: 14 U/L (ref 0–44)
AST: 11 U/L — ABNORMAL LOW (ref 15–41)
Albumin: 4.3 g/dL (ref 3.5–5.0)
Alkaline Phosphatase: 80 U/L (ref 38–126)
Anion gap: 12 (ref 5–15)
BUN: 13 mg/dL (ref 6–20)
CO2: 23 mmol/L (ref 22–32)
Calcium: 9.9 mg/dL (ref 8.9–10.3)
Chloride: 108 mmol/L (ref 98–111)
Creatinine, Ser: 0.6 mg/dL (ref 0.44–1.00)
GFR, Estimated: >60 mL/min (ref >60)
Glucose, Bld: 116 mg/dL — ABNORMAL HIGH (ref 70–99)
Potassium: 3.5 mmol/L (ref 3.5–5.1)
Sodium: 143 mmol/L (ref 135–145)
Total Bilirubin: 1.2 mg/dL (ref 0.3–1.2)
Total Protein: 8.2 g/dL — ABNORMAL HIGH (ref 6.5–8.1)

## 2020-03-12 LAB — CBC WITH DIFFERENTIAL/PLATELET
Abs Immature Granulocytes: 0.03 10*3/uL (ref 0.00–0.07)
Basophils Absolute: 0 10*3/uL (ref 0.0–0.1)
Basophils Relative: 0 %
Eosinophils Absolute: 0.1 10*3/uL (ref 0.0–0.5)
Eosinophils Relative: 1 %
HCT: 37.5 % (ref 36.0–46.0)
Hemoglobin: 12.9 g/dL (ref 12.0–15.0)
Immature Granulocytes: 0 %
Lymphocytes Relative: 19 %
Lymphs Abs: 1.7 10*3/uL (ref 0.7–4.0)
MCH: 23.8 pg — ABNORMAL LOW (ref 26.0–34.0)
MCHC: 34.4 g/dL (ref 30.0–36.0)
MCV: 69.3 fL — ABNORMAL LOW (ref 80.0–100.0)
Monocytes Absolute: 0.7 10*3/uL (ref 0.1–1.0)
Monocytes Relative: 7 %
Neutro Abs: 6.5 10*3/uL (ref 1.7–7.7)
Neutrophils Relative %: 73 %
Platelets: 307 10*3/uL (ref 150–400)
RBC: 5.41 MIL/uL — ABNORMAL HIGH (ref 3.87–5.11)
RDW: 14.8 % (ref 11.5–15.5)
WBC: 9 10*3/uL (ref 4.0–10.5)
nRBC: 0 % (ref 0.0–0.2)

## 2020-03-12 LAB — RETICULOCYTES
Immature Retic Fract: 19.4 % — ABNORMAL HIGH (ref 2.3–15.9)
RBC.: 5.42 MIL/uL — ABNORMAL HIGH (ref 3.87–5.11)
Retic Count, Absolute: 156.6 10*3/uL (ref 19.0–186.0)
Retic Ct Pct: 2.9 % (ref 0.4–3.1)

## 2020-03-12 LAB — PREGNANCY, URINE: Preg Test, Ur: NEGATIVE

## 2020-03-12 MED ORDER — ACETAMINOPHEN 500 MG PO TABS
1000.0000 mg | ORAL_TABLET | Freq: Once | ORAL | Status: AC
  Administered 2020-03-12: 1000 mg via ORAL
  Filled 2020-03-12: qty 2

## 2020-03-12 MED ORDER — HYDROMORPHONE HCL 2 MG/ML IJ SOLN
2.0000 mg | INTRAMUSCULAR | Status: AC
  Administered 2020-03-12 (×2): 2 mg via SUBCUTANEOUS
  Filled 2020-03-12 (×2): qty 1

## 2020-03-12 MED ORDER — HYDROMORPHONE HCL 2 MG/ML IJ SOLN
2.0000 mg | Freq: Once | INTRAMUSCULAR | Status: AC
Start: 2020-03-12 — End: 2020-03-12
  Administered 2020-03-12: 2 mg via INTRAVENOUS
  Filled 2020-03-12: qty 1

## 2020-03-12 MED ORDER — KETOROLAC TROMETHAMINE 30 MG/ML IJ SOLN: 15.0000 mg | Freq: Once | INTRAMUSCULAR | Status: DC

## 2020-03-12 MED ORDER — SODIUM CHLORIDE 0.45 % IV SOLN: INTRAVENOUS | Status: DC

## 2020-03-12 MED ORDER — OXYCODONE HCL 5 MG PO TABS
20.0000 mg | ORAL_TABLET | ORAL | Status: DC | PRN
  Administered 2020-03-12 (×2): 20 mg via ORAL
  Filled 2020-03-12 (×3): qty 4

## 2020-03-12 MED ORDER — HYDROMORPHONE HCL 2 MG/ML IJ SOLN
2.0000 mg | Freq: Once | INTRAMUSCULAR | Status: AC
Start: 2020-03-12 — End: 2020-03-12
  Administered 2020-03-12: 2 mg via SUBCUTANEOUS
  Filled 2020-03-12: qty 1

## 2020-03-12 MED ORDER — OXYCODONE HCL 20 MG PO TABS
20.0000 mg | ORAL_TABLET | ORAL | 0 refills | Status: DC | PRN
Start: 2020-03-15 — End: 2020-03-26

## 2020-03-12 NOTE — BH Specialist Note (Signed)
Integrated Behavioral Health General Follow Up Note  03/12/2020 Name: Stephanie Burch MRN: [id number] DOB: 17-Dec-1980 Stephanie Burch is a 40 y.o. year old female who sees Kallie Locks, FNP for primary care. LCSW was initially consulted to support with stress precipitating sickle cell crisis and illness anxiety.  Interpreter: No.   Interpreter Name & Language: none  Assessment: Patient experiencing anxiety related to chronic illness.   Ongoing Intervention: Patient rescheduled PCP appointment 03/07/20. CSW followed up to reschedule integrated behavioral health (IBH appointment scheduled for that same day, but did not receive call back from patient. Brief visit with patient at bedside today. Patient indicated she still would like an IBH appointment. Scheduled virtual visit for 03/16/20.  Review of patient status, including review of consultants reports, relevant laboratory and other test results, and collaboration with appropriate care team members and the patient's provider was performed as part of comprehensive patient evaluation and provision of services.     Follow up Plan: 1. Outpatient follow up 03/16/20  Abigail Butts, LCSW Patient [facility] [phone]

## 2020-03-12 NOTE — Discharge Summary (Signed)
[facility] Discharge Summary   Patient ID: Stephanie Burch MRN: [id number] DOB/AGE: 11-Jul-1980 40 y.o.  Admit date: 03/12/2020 Discharge date: 03/12/2020  Primary Care Physician:  Kallie Locks, FNP  Admission Diagnoses:  Active Problems:   Sickle cell pain crisis [facility])   Discharge Medications:  Allergies as of 03/12/2020   No Known Allergies     Medication List    TAKE these medications   folic acid 1 MG tablet Commonly known as: FOLVITE Take 1 tablet (1 mg total) by mouth daily.   ibuprofen 800 MG tablet Commonly known as: ADVIL Take 1 tablet (800 mg total) by mouth every 8 (eight) hours as needed for headache, mild pain, moderate pain or cramping.   naloxone 0.4 MG/ML injection Commonly known as: NARCAN As needed   ondansetron 4 MG disintegrating tablet Commonly known as: Zofran ODT Take 1 tablet (4 mg total) by mouth every 8 (eight) hours as needed for nausea or vomiting.   ondansetron 4 MG tablet Commonly known as: ZOFRAN Take 4 mg by mouth every 8 (eight) hours as needed for nausea or vomiting.   Oxbryta 500 MG Tabs tablet Generic drug: voxelotor Take 1,500 mg by mouth daily.   Oxycodone HCl 20 MG Tabs Take 1 tablet (20 mg total) by mouth every 4 (four) hours as needed. Start taking on: March 15, 2020 What changed: These instructions start on March 15, 2020. If you are unsure what to do until then, ask your doctor or other care provider.   tiZANidine 4 MG capsule Commonly known as: ZANAFLEX Take 1 capsule (4 mg total) by mouth 3 (three) times daily as needed for muscle spasms.   valACYclovir 500 MG tablet Commonly known as: Valtrex Take Valtrex 500 mg daily for suppression of outbreaks. Take Valtrex 1000 mg daily X 1 week for outbreaks. What changed:   how much to take  how to take this  when to take this  additional instructions   Vitamin D (Ergocalciferol) 1.25 MG (50000 UNIT) Caps capsule Commonly known as:  DRISDOL Take 1 capsule (50,000 Units total) by mouth every 7 (seven) days. What changed: additional instructions   Xtampza ER 18 MG C12a Generic drug: oxyCODONE ER Take 18 mg by mouth every 12 (twelve) hours.   Xtampza ER 18 MG C12a Generic drug: oxyCODONE ER Take 18 mg by mouth every 12 (twelve) hours.        Consults:  None  Significant Diagnostic Studies:  No results found.  History of present illness:  Stephanie Burch is a 40 year female with a medical history significant for sickle cell disease, chronic pain syndrome, opiate dependence and tolerance, history of depression and anxiety, anemia of chronic disease, and morbid obesity presents with complaints of lower extremity pain that is consistent with her typical pain crisis.  Patient attributes pain crisis to increased stressors at home.  Patient states that she has been taking more medication than prescribed to alleviate pain and to avoid going to the emergency department.  Patient is now out of home pain medication.  She last had oxycodone 20 mg on yesterday without sustained relief.  Patient rates pain at 9/10 characterized as constant and throbbing.  She denies any subjective fever, chills, persistent coughing, urinary symptoms, nausea, vomiting, or diarrhea.  No sick contacts, recent travel, or exposure to COVID-19. [facility] Course: Patient admitted to sickle cell day infusion clinic for management of pain crisis. All laboratory values within normal limits Pain managed with  Dilaudid 2 mg SQ x2 Dilaudid 2 mg IV x 1 Toradol 15 mg x 1 Tylenol 1000 mg x 1 Oxycodone 20 mg every 4 hours as needed for severe breakthrough pain Patient's pain improved minimally throughout admission.  Patient advised to return to day clinic in a.m. for further pain management and extended observation.  She is alert, oriented, and ambulating without assistance.  Patient will discharge home in a hemodynamically stable condition.  Patient  states that she is content in a car service for transportation home.  Discharge instructions:  Resume all home medications.   Follow up with PCP as previously  scheduled.   Discussed the importance of drinking 64 ounces of water daily, dehydration of red blood cells may lead further sickling.   Avoid all stressors that precipitate sickle cell pain crisis.     The patient was given clear instructions to go to ER or return to medical center if symptoms do not improve, worsen or new problems develop.     Physical Exam at Discharge:  BP (!) 129/93 (BP Location: Left Arm)   Pulse 91   Temp 97.9 F (36.6 C) (Temporal)   Resp 16   LMP 03/09/2019 (Approximate)   SpO2 100%   Physical Exam Constitutional:      Appearance: Normal appearance. She is obese.  Eyes:     Pupils: Pupils are equal, round, and reactive to light.  Cardiovascular:     Rate and Rhythm: Normal rate and regular rhythm.  Pulmonary:     Effort: Pulmonary effort is normal.  Abdominal:     General: Bowel sounds are normal.  Skin:    General: Skin is warm.  Neurological:     General: No focal deficit present.     Mental Status: She is alert. Mental status is at baseline.  Psychiatric:        Mood and Affect: Mood normal.        Behavior: Behavior normal.        Thought Content: Thought content normal.        Judgment: Judgment normal.       Disposition at Discharge: Discharge disposition: 01-Home or Self Care       Discharge Orders:   Condition at Discharge:   Stable  Time spent on Discharge:  Greater than 30 minutes.  Signed: Nolon Nations  APRN, MSN, FNP-C Patient Care [facility] [address] [place], [place] [address] [phone]  03/12/2020, 4:37 PM

## 2020-03-12 NOTE — Discharge Instructions (Signed)
Sickle Cell Anemia, Adult  Sickle cell anemia is a condition where your red blood cells are shaped like sickles. Red blood cells carry oxygen through the body. Sickle-shaped cells do not live as long as normal red blood cells. They also clump together and block blood from flowing through the blood vessels. This prevents the body from getting enough oxygen. Sickle cell anemia causes organ damage and pain. It also increases the risk of infection. Follow these instructions at home: Medicines  Take over-the-counter and prescription medicines only as told by your doctor.  If you were prescribed an antibiotic medicine, take it as told by your doctor. Do not stop taking the antibiotic even if you start to feel better.  If you develop a fever, do not take medicines to lower the fever right away. Tell your doctor about the fever. Managing pain, stiffness, and swelling  Try these methods to help with pain: ? Use a heating pad. ? Take a warm bath. ? Distract yourself, such as by watching TV. Eating and drinking  Drink enough fluid to keep your pee (urine) clear or pale yellow. Drink more in hot weather and during exercise.  Limit or avoid alcohol.  Eat a healthy diet. Eat plenty of fruits, vegetables, whole grains, and lean protein.  Take vitamins and supplements as told by your doctor. Traveling  When traveling, keep these with you: ? Your medical information. ? The names of your doctors. ? Your medicines.  If you need to take an airplane, talk to your doctor first. Activity  Rest often.  Avoid exercises that make your heart beat much faster, such as jogging. General instructions  Do not use products that have nicotine or tobacco, such as cigarettes and e-cigarettes. If you need help quitting, ask your doctor.  Consider wearing a medical alert bracelet.  Avoid being in high places (high altitudes), such as mountains.  Avoid very hot or cold temperatures.  Avoid places where the  temperature changes a lot.  Keep all follow-up visits as told by your doctor. This is important. Contact a doctor if:  A joint hurts.  Your feet or hands hurt or swell.  You feel tired (fatigued). Get help right away if:  You have symptoms of infection. These include: ? Fever. ? Chills. ? Being very tired. ? Irritability. ? Poor eating. ? Throwing up (vomiting).  You feel dizzy or faint.  You have new stomach pain, especially on the left side.  You have a an erection (priapism) that lasts more than 4 hours.  You have numbness in your arms or legs.  You have a hard time moving your arms or legs.  You have trouble talking.  You have pain that does not go away when you take medicine.  You are short of breath.  You are breathing fast.  You have a long-term cough.  You have pain in your chest.  You have a bad headache.  You have a stiff neck.  Your stomach looks bloated even though you did not eat much.  Your skin is pale.  You suddenly cannot see well. Summary  Sickle cell anemia is a condition where your red blood cells are shaped like sickles.  Follow your doctor's advice on ways to manage pain, food to eat, activities to do, and steps to take for safe travel.  Get medical help right away if you have any signs of infection, such as a fever. This information is not intended to replace advice given to you by   your health care provider. Make sure you discuss any questions you have with your health care provider. Document Revised: 06/16/2019 Document Reviewed: 06/16/2019 Elsevier Patient Education  2021 [employer]  

## 2020-03-12 NOTE — H&P (Signed)
[facility] History and Physical   Date: 03/12/2020  Patient name: Stephanie Burch Medical record number: [id number] Date of birth: 1980-08-21 Age: 40 y.o. Gender: female PCP: Kallie Locks, FNP  Attending physician: Quentin Angst, MD  Chief Complaint: Sickle cell pain  History of Present Illness: Stephanie Burch is a 40 year female with a medical history significant for sickle cell disease, chronic pain syndrome, opiate dependence and tolerance, history of depression and anxiety, anemia of chronic disease, and morbid obesity presents with complaints of lower extremity pain that is consistent with her typical pain crisis.  Patient attributes pain crisis to increased stressors at home.  Patient states that she has been taking more medication than prescribed to alleviate pain and to avoid going to the emergency department.  Patient is now out of home pain medication.  She last had oxycodone 20 mg on yesterday without sustained relief.  Patient rates pain at 9/10 characterized as constant and throbbing.  She denies any subjective fever, chills, persistent coughing, urinary symptoms, nausea, vomiting, or diarrhea.  No sick contacts, recent travel, or exposure to COVID-19.  Meds: Medications Prior to Admission  Medication Sig Dispense Refill Last Dose  . folic acid (FOLVITE) 1 MG tablet Take 1 tablet (1 mg total) by mouth daily. 30 tablet 11   . ibuprofen (ADVIL) 800 MG tablet Take 1 tablet (800 mg total) by mouth every 8 (eight) hours as needed for headache, mild pain, moderate pain or cramping. 30 tablet 6   . naloxone (NARCAN) 0.4 MG/ML injection As needed (Patient not taking: Reported on 01/10/2020) 1 mL 0   . ondansetron (ZOFRAN ODT) 4 MG disintegrating tablet Take 1 tablet (4 mg total) by mouth every 8 (eight) hours as needed for nausea or vomiting. (Patient not taking: Reported on 02/10/2020) 20 tablet 0   . ondansetron (ZOFRAN) 4 MG tablet Take 4 mg by mouth every 8 (eight)  hours as needed for nausea or vomiting.      Marland Kitchen oxyCODONE ER (XTAMPZA ER) 18 MG C12A Take 18 mg by mouth every 12 (twelve) hours. 60 capsule 0   . oxyCODONE ER (XTAMPZA ER) 18 MG C12A Take 18 mg by mouth every 12 (twelve) hours. 60 capsule 0   . Oxycodone HCl 20 MG TABS Take 1 tablet (20 mg total) by mouth every 4 (four) hours as needed. 90 tablet 0   . tiZANidine (ZANAFLEX) 4 MG capsule Take 1 capsule (4 mg total) by mouth 3 (three) times daily as needed for muscle spasms. 90 capsule 6   . valACYclovir (VALTREX) 500 MG tablet Take Valtrex 500 mg daily for suppression of outbreaks. Take Valtrex 1000 mg daily X 1 week for outbreaks. (Patient taking differently: Take 500 mg by mouth daily.) 90 tablet 11   . Vitamin D, Ergocalciferol, (DRISDOL) 1.25 MG (50000 UT) CAPS capsule Take 1 capsule (50,000 Units total) by mouth every 7 (seven) days. (Patient taking differently: Take 50,000 Units by mouth every 7 (seven) days. Take on Sunday) 5 capsule 6   . voxelotor (OXBRYTA) 500 MG TABS tablet Take 1,500 mg by mouth daily. 90 tablet 11     Allergies: Patient has no known allergies. Past Medical History:  Diagnosis Date  . HSV infection   . Nausea   . Sickle cell anemia (HCC)   . Vitamin B12 deficiency 12/2018  . Vitamin D deficiency    No past surgical history on file. Family History  Problem Relation Age of Onset  .  Hypertension Mother   . Sickle cell trait Mother   . Diabetes Father   . Sickle cell trait Father    Social History   Socioeconomic History  . Marital status: Single    Spouse name: Not on file  . Number of children: Not on file  . Years of education: Not on file  . Highest education level: Not on file  Occupational History  . Not on file  Tobacco Use  . Smoking status: Never Smoker  . Smokeless tobacco: Never Used  Vaping Use  . Vaping Use: Never used  Substance and Sexual Activity  . Alcohol use: Not Currently  . Drug use: Never  . Sexual activity: Yes    Birth  control/protection: None  Other Topics Concern  . Not on file  Social History Narrative  . Not on file   Social Determinants of Health   Financial Resource Strain: Not on file  Food Insecurity: Not on file  Transportation Needs: Not on file  Physical Activity: Not on file  Stress: Not on file  Social Connections: Not on file  Intimate Partner Violence: Not on file   Review of Systems  Constitutional: Negative for chills and fever.  HENT: Negative.   Eyes: Negative.   Respiratory: Negative.   Cardiovascular: Negative.   Gastrointestinal: Negative.   Genitourinary: Negative.   Musculoskeletal: Positive for back pain and joint pain.  Skin: Negative.   Neurological: Negative.   Psychiatric/Behavioral: Negative.      Physical Exam: Blood pressure (!) 125/92, pulse (!) 112, temperature 98 F (36.7 C), temperature source Temporal, resp. rate 16, SpO2 100 %. Physical Exam Constitutional:      Appearance: Normal appearance. She is obese.  Eyes:     Pupils: Pupils are equal, round, and reactive to light.  Cardiovascular:     Rate and Rhythm: Normal rate and regular rhythm.  Pulmonary:     Effort: Pulmonary effort is normal.  Abdominal:     General: Abdomen is flat. Bowel sounds are normal.  Skin:    General: Skin is warm.  Neurological:     General: No focal deficit present.     Mental Status: She is alert. Mental status is at baseline.  Psychiatric:        Mood and Affect: Mood normal.        Behavior: Behavior normal.        Thought Content: Thought content normal.        Judgment: Judgment normal.      Lab results: No results found for this or any previous visit (from the past 24 hour(s)).  Imaging results:  No results found.   Assessment & Plan: Patient admitted to [facility] cell day infusion center for management of pain crisis.  Patient is opiate tolerant Dilaudid 2 mg x 3 doses IV fluids, 0 large.45% saline at 100 ml/hr Toradol 15 mg IV times one  dose Tylenol 1000 mg by mouth times one dose Review CBC with differential, complete metabolic panel, and reticulocytes as results become available.  Pain intensity will be reevaluated in context of functioning and relationship to baseline as care progresses If pain intensity remains elevated and/or sudden change in hemodynamic stability transition to inpatient services for higher level of care.     Nolon Nations  APRN, MSN, FNP-C Patient Care [facility] [address] [place], [place] [address] [phone]    03/12/2020, 9:30 AM

## 2020-03-12 NOTE — Telephone Encounter (Signed)
Patient called, requesting to come to the day hospital due to pain in the legs and left arm rated at 9/10. Denied chest pain, fever, diarrhea, abdominal pain, nausea/vomitting. Screened negative for Covid-19 symptoms. Admitted to having means of transportation without driving self after treatment. Last took 20 mg of oxycodone at 20:00 yestaerday. Per provider, patient can come to the day hospital but will be treated with prescribed home medications with no PCA. Per patient home medication is "running low". Patient notified, verbalized understanding.

## 2020-03-12 NOTE — Progress Notes (Signed)
Pt admitted to the day hospital for treatment of sickle cell pain crisis. Pt reported pain to L arm and bilateral legs  Rated 8/10. Pt given PO Tylenol and hydrated with IV fluids. Pt received SQ Dilaudid x 3 with an hour in between doses. Pt received additional 2mg  Dilaudid IV and oral oxycodone. Per FNP,pt instructed to return to day hospital tomorrow at 8 am to continue pain management, pt verbalized understanding and agreeable to plan. At discharge patient reported pain a 8/10.  Pt alert , oriented and ambulatory at discharge.

## 2020-03-13 ENCOUNTER — Ambulatory Visit (INDEPENDENT_AMBULATORY_CARE_PROVIDER_SITE_OTHER): Payer: Medicaid Other | Admitting: Family Medicine

## 2020-03-13 ENCOUNTER — Encounter: Payer: Self-pay | Admitting: Family Medicine

## 2020-03-13 ENCOUNTER — Ambulatory Visit: Payer: Self-pay | Admitting: Family Medicine

## 2020-03-13 ENCOUNTER — Other Ambulatory Visit: Payer: Self-pay | Admitting: Family Medicine

## 2020-03-13 ENCOUNTER — Non-Acute Institutional Stay (HOSPITAL_COMMUNITY)
Admission: AD | Admit: 2020-03-13 | Discharge: 2020-03-13 | Disposition: A | Discharge status: Home / Self Care | Payer: Medicaid Other | Source: Ambulatory Visit | Attending: Internal Medicine | Admitting: Internal Medicine

## 2020-03-13 DIAGNOSIS — D638 Anemia in other chronic diseases classified elsewhere: Secondary | ICD-10-CM | POA: Diagnosis not present

## 2020-03-13 DIAGNOSIS — G894 Chronic pain syndrome: Secondary | ICD-10-CM | POA: Insufficient documentation

## 2020-03-13 DIAGNOSIS — D571 Sickle-cell disease without crisis: Secondary | ICD-10-CM | POA: Diagnosis not present

## 2020-03-13 DIAGNOSIS — Z09 Encounter for follow-up examination after completed treatment for conditions other than malignant neoplasm: Secondary | ICD-10-CM | POA: Diagnosis not present

## 2020-03-13 DIAGNOSIS — Z8249 Family history of ischemic heart disease and other diseases of the circulatory system: Secondary | ICD-10-CM | POA: Diagnosis not present

## 2020-03-13 DIAGNOSIS — F1190 Opioid use, unspecified, uncomplicated: Secondary | ICD-10-CM

## 2020-03-13 DIAGNOSIS — Z791 Long term (current) use of non-steroidal anti-inflammatories (NSAID): Secondary | ICD-10-CM | POA: Diagnosis not present

## 2020-03-13 DIAGNOSIS — Z833 Family history of diabetes mellitus: Secondary | ICD-10-CM | POA: Insufficient documentation

## 2020-03-13 DIAGNOSIS — F1120 Opioid dependence, uncomplicated: Secondary | ICD-10-CM | POA: Insufficient documentation

## 2020-03-13 DIAGNOSIS — D5700 Hb-SS disease with crisis, unspecified: Secondary | ICD-10-CM | POA: Diagnosis not present

## 2020-03-13 DIAGNOSIS — Z832 Family history of diseases of the blood and blood-forming organs and certain disorders involving the immune mechanism: Secondary | ICD-10-CM | POA: Diagnosis not present

## 2020-03-13 DIAGNOSIS — Z79899 Other long term (current) drug therapy: Secondary | ICD-10-CM

## 2020-03-13 DIAGNOSIS — F419 Anxiety disorder, unspecified: Secondary | ICD-10-CM

## 2020-03-13 MED ORDER — DIPHENHYDRAMINE HCL 25 MG PO CAPS
25.0000 mg | ORAL_CAPSULE | ORAL | Status: DC | PRN
  Administered 2020-03-13: 25 mg via ORAL
  Filled 2020-03-13: qty 1

## 2020-03-13 MED ORDER — NALOXONE HCL 0.4 MG/ML IJ SOLN: 0.4000 mg | INTRAMUSCULAR | Status: DC | PRN

## 2020-03-13 MED ORDER — ONDANSETRON HCL 4 MG/2ML IJ SOLN
4.0000 mg | Freq: Four times a day (QID) | INTRAMUSCULAR | Status: DC | PRN
  Administered 2020-03-13: 4 mg via INTRAVENOUS
  Filled 2020-03-13: qty 2

## 2020-03-13 MED ORDER — SODIUM CHLORIDE 0.9% FLUSH: 9.0000 mL | INTRAVENOUS | Status: DC | PRN

## 2020-03-13 MED ORDER — KETOROLAC TROMETHAMINE 30 MG/ML IJ SOLN
15.0000 mg | Freq: Once | INTRAMUSCULAR | Status: AC
  Administered 2020-03-13: 15 mg via INTRAVENOUS
  Filled 2020-03-13: qty 1

## 2020-03-13 MED ORDER — SODIUM CHLORIDE 0.45 % IV SOLN: INTRAVENOUS | Status: DC

## 2020-03-13 MED ORDER — HYDROMORPHONE 1 MG/ML IV SOLN
INTRAVENOUS | Status: DC
  Administered 2020-03-13: 16 mg via INTRAVENOUS
  Administered 2020-03-13: 30 mg via INTRAVENOUS
  Filled 2020-03-13: qty 30

## 2020-03-13 NOTE — Progress Notes (Signed)
[facility]   Hospital Follow Up  Subjective:  Patient ID: Stephanie Burch, female    DOB: 1980-04-09  Age: 41 y.o. MRN: [id number]  CC: No chief complaint on file.   HPI Stephanie Burch is a 40 year old female who presents for Hospital Follow Up today.    Patient Active Problem List   Diagnosis Date Noted  . Pulmonary edema 11/11/2019  . Hypoxia   . Poor venous access 11/09/2019  . HCAP (healthcare-associated pneumonia) 11/07/2019  . Sepsis (HCC) 11/07/2019  . Acute kidney injury (HCC) 11/07/2019  . History of COVID-19 11/07/2019  . COVID-19 virus detected 10/22/2019  . Sore throat due to virus 10/22/2019  . Urinary frequency 04/20/2019  . Vaginal odor 04/20/2019  . Chest pain varying with breathing   . Fever and chills   . Vitamin D deficiency 11/15/2018  . Nausea 11/15/2018  . Abnormal pulse oximetry   . Hypokalemia 09/20/2018  . Medication management 09/14/2018  . Muscle spasms of both lower extremities 08/25/2018  . Subjective visual disturbance of right eye 08/25/2018  . 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 Chronic pain syndrome 03/16/2018  . Chronic, continuous use of opioids 03/16/2018  . Tachycardia with heart rate 100-120 beats per minute   . Sickle cell crisis (HCC) 10/19/2017  . Leukocytosis 10/19/2017  . Thrombocytopenia (HCC) 10/19/2017  . Morbidly obese (HCC) 10/19/2017  . Sickle cell pain crisis (HCC) 10/15/2017   Current Status: Since her last office visit, she is doing well with no complaints. She states that she has pain in her and legs. She rates her pain today at 5/10. She has not had a hospital visit for Sickle Cell Crisis since 02/10/2020 where she was treated and discharged on 02/15/2020. [facility], 03/13/2020. She is currently taking all medications as prescribed and staying well hydrated. She reports occasional nausea, constipation, dizziness  and headaches. She last took pain medications at earlier today before [facility].  She denies fevers, chills, fatigue, recent infections, weight loss, and night sweats. She has not had any visual changes, and falls. No chest pain, heart palpitations, cough and shortness of breath reported. Denies GI problems such as vomiting, and diarrhea. She has no reports of blood in stools, dysuria and hematuria. No depression or anxiety reported today. Patient states that she has not received last shipment of Voxelotor [employer] [employer]). She is taking all other medications as prescribed.   Past Medical History:  Diagnosis Date  . HSV infection   . Nausea   . Sickle cell anemia (HCC)   . Vitamin B12 deficiency 12/2018  . Vitamin D deficiency     No past surgical history on file.  Family History  Problem Relation Age of Onset  . Hypertension Mother   . Sickle cell trait Mother   . Diabetes Father   . Sickle cell trait Father     Social History   Socioeconomic History  . Marital status: Single    Spouse name: Not on file  . Number of children: Not on file  . Years of education: Not on file  . Highest education level: Not on file  Occupational History  . Not on file  Tobacco Use  . Smoking status: Never Smoker  . Smokeless tobacco: Never Used  Vaping Use  . Vaping Use: Never used  Substance and Sexual Activity  . Alcohol use: Not Currently  .  Drug use: Never  . Sexual activity: Yes    Birth control/protection: None  Other Topics Concern  . Not on file  Social History Narrative  . Not on file   Social Determinants of Health   Financial Resource Strain: Not on file  Food Insecurity: Not on file  Transportation Needs: Not on file  Physical Activity: Not on file  Stress: Not on file  Social Connections: Not on file  Intimate Partner Violence: Not on file    Facility-Administered Medications Prior to Visit  Medication Dose Route Frequency Provider Last Rate Last  Admin  . 0.45 % sodium chloride infusion   Intravenous Continuous Massie Maroon, FNP 0 mL/hr at 03/13/20 1608 Canceled Entry at 03/13/20 1611  . diphenhydrAMINE (BENADRYL) capsule 25 mg  25 mg Oral Q4H PRN Massie Maroon, FNP   25 mg at 03/13/20 1007  . HYDROmorphone (DILAUDID) 1 mg/mL PCA injection   Intravenous Q4H Massie Maroon, FNP   30 mg at 03/13/20 1007  . naloxone [facility]) injection 0.4 mg  0.4 mg Intravenous PRN Massie Maroon, FNP       And  . sodium chloride flush (NS) 0.9 % injection 9 mL  9 mL Intravenous PRN Massie Maroon, FNP      . ondansetron [facility]) injection 4 mg  4 mg Intravenous Q6H PRN Massie Maroon, FNP   4 mg at 03/13/20 1117   Outpatient Medications Prior to Visit  Medication Sig Dispense Refill  . folic acid (FOLVITE) 1 MG tablet Take 1 tablet (1 mg total) by mouth daily. 30 tablet 11  . ibuprofen (ADVIL) 800 MG tablet Take 1 tablet (800 mg total) by mouth every 8 (eight) hours as needed for headache, mild pain, moderate pain or cramping. 30 tablet 6  . naloxone (NARCAN) 0.4 MG/ML injection As needed (Patient not taking: Reported on 01/10/2020) 1 mL 0  . ondansetron (ZOFRAN ODT) 4 MG disintegrating tablet Take 1 tablet (4 mg total) by mouth every 8 (eight) hours as needed for nausea or vomiting. (Patient not taking: Reported on 02/10/2020) 20 tablet 0  . ondansetron (ZOFRAN) 4 MG tablet Take 4 mg by mouth every 8 (eight) hours as needed for nausea or vomiting.     Marland Kitchen oxyCODONE ER (XTAMPZA ER) 18 MG C12A Take 18 mg by mouth every 12 (twelve) hours. 60 capsule 0  . oxyCODONE ER (XTAMPZA ER) 18 MG C12A Take 18 mg by mouth every 12 (twelve) hours. 60 capsule 0  . [START ON 03/15/2020] Oxycodone HCl 20 MG TABS Take 1 tablet (20 mg total) by mouth every 4 (four) hours as needed. 90 tablet 0  . tiZANidine (ZANAFLEX) 4 MG capsule Take 1 capsule (4 mg total) by mouth 3 (three) times daily as needed for muscle spasms. 90 capsule 6  . valACYclovir (VALTREX) 500 MG  tablet Take Valtrex 500 mg daily for suppression of outbreaks. Take Valtrex 1000 mg daily X 1 week for outbreaks. (Patient taking differently: Take 500 mg by mouth daily.) 90 tablet 11  . Vitamin D, Ergocalciferol, (DRISDOL) 1.25 MG (50000 UT) CAPS capsule Take 1 capsule (50,000 Units total) by mouth every 7 (seven) days. (Patient taking differently: Take 50,000 Units by mouth every 7 (seven) days. Take on Sunday) 5 capsule 6  . voxelotor (OXBRYTA) 500 MG TABS tablet Take 1,500 mg by mouth daily. 90 tablet 11    No Known Allergies  ROS Review of Systems  Constitutional: Negative.   HENT: Negative.  Eyes: Negative.   Respiratory: Negative.   Cardiovascular: Negative.   Gastrointestinal: Positive for constipation (occasional ) and nausea (occasional ).  Endocrine: Negative.   Genitourinary: Negative.   Musculoskeletal: Positive for arthralgias (generalized joint pain).  Skin: Negative.   Allergic/Immunologic: Negative.   Neurological: Positive for dizziness (occasional ) and headaches (occasional ).  Hematological: Negative.   Psychiatric/Behavioral: Negative.       Objective:    Physical Exam Vitals and nursing note reviewed.  Constitutional:      Appearance: Normal appearance.  HENT:     Head: Normocephalic and atraumatic.     Nose: Nose normal.     Mouth/Throat:     Mouth: Mucous membranes are moist.     Pharynx: Oropharynx is clear.  Cardiovascular:     Rate and Rhythm: Normal rate and regular rhythm.     Pulses: Normal pulses.     Heart sounds: Normal heart sounds.  Pulmonary:     Effort: Pulmonary effort is normal.     Breath sounds: Normal breath sounds.  Abdominal:     General: Bowel sounds are normal. There is distension (obese).     Palpations: Abdomen is soft.  Musculoskeletal:        General: Normal range of motion.     Cervical back: Normal range of motion and neck supple.  Skin:    General: Skin is warm and dry.  Neurological:     General: No focal  deficit present.     Mental Status: She is alert and oriented to person, place, and time.  Psychiatric:        Mood and Affect: Mood normal.        Behavior: Behavior normal.        Thought Content: Thought content normal.        Judgment: Judgment normal.    There were no vitals taken for this visit. Wt Readings from Last 3 Encounters:  03/08/20 299 lb (135.6 kg)  02/11/20 283 lb 1.1 oz (128.4 kg)  01/14/20 (!) 304 lb 3.8 oz (138 kg)     Health Maintenance Due  Topic Date Due  . Hepatitis C Screening  Never done  . COVID-19 Vaccine (1) Never done  . PAP SMEAR-Modifier  Never done    There are no preventive care reminders to display for this patient.  Lab Results  Component Value Date   TSH 1.120 11/15/2018   Lab Results  Component Value Date   WBC 9.0 03/12/2020   HGB 12.9 03/12/2020   HCT 37.5 03/12/2020   MCV 69.3 (L) 03/12/2020   PLT 307 03/12/2020   Lab Results  Component Value Date   NA 143 03/12/2020   K 3.5 03/12/2020   CO2 23 03/12/2020   GLUCOSE 116 (H) 03/12/2020   BUN 13 03/12/2020   CREATININE 0.60 03/12/2020   BILITOT 1.2 03/12/2020   ALKPHOS 80 03/12/2020   AST 11 (L) 03/12/2020   ALT 14 03/12/2020   PROT 8.2 (H) 03/12/2020   ALBUMIN 4.3 03/12/2020   CALCIUM 9.9 03/12/2020   ANIONGAP 12 03/12/2020   Lab Results  Component Value Date   CHOL 84 (L) 11/15/2018   Lab Results  Component Value Date   HDL 33 (L) 11/15/2018   Lab Results  Component Value Date   LDLCALC 34 11/15/2018   Lab Results  Component Value Date   TRIG 149 11/07/2019   Lab Results  Component Value Date   CHOLHDL 2.5 11/15/2018   Lab Results  Component Value Date   HGBA1C 4.1 09/11/2017    Assessment & Plan:   1. Hospital discharge follow-up  2. Hb-SS disease without crisis [facility]) [facility] and possible IV pain medications and anti-emetics to manage her current pain crisis. After discharge she will continue  to take pain medications as prescribed; will continue to avoid extreme heat and cold; will continue to eat a healthy diet and drink at least 64 ounces of water daily; continue stool softener as needed; will avoid colds and flu; will continue to get plenty of sleep and rest; will continue to avoid high stressful situations and remain infection free; will continue Folic Acid 1 mg daily to avoid sickle cell crisis. Continue to follow up with Hematologist as needed.   3. Chronic, continuous use of opioids  4. Chronic pain syndrome  5. Medication management Contacted representative for [employer]ox[employer] Farrel Gobble). States that patient has been lost to follow up and has not returned calls to office. Reports that last shipment of medication for 30 day supply was shipped on 06/2019. They will continue to attempt to contact patient as she will need to sign for medication once it is shipped to her home.   6. Anxiety  7. Follow up She will follow up in 2 months.   No orders of the defined types were placed in this encounter.   No orders of the defined types were placed in this encounter.   Referral Orders  No referral(s) requested today    [name], MSN, ANE, FNP-BC [facility]/Internal Medicine/[facility] [address] [place], [place] [address] [phone]- fax   Problem List Items Addressed This Visit      Other   Chronic pain syndrome   Chronic, continuous use of opioids   Hb-SS disease without crisis [facility])   Medication management    Other Visit Diagnoses    Hospital discharge follow-up    -  Primary   Anxiety       Follow up          No orders of the defined types were placed in this encounter.   Follow-up: No follow-ups on file.    Kallie Locks, FNP

## 2020-03-13 NOTE — H&P (Signed)
[facility] History and Physical   Date: 03/13/2020  Patient name: Stephanie Burch Medical record number: [id number] Date of birth: 29-Aug-1980 Age: 40 y.o. Gender: female PCP: Kallie Locks, FNP  Attending physician: Quentin Angst, MD  Chief Complaint: Sickle cell pain  History of Present Illness: Stephanie Burch is a 40 year old female with a medical history significant for sickle cell disease, chronic pain syndrome, opiate dependence and tolerance, history of depression and anxiety, anemia of chronic disease, and morbid obesity presents with complaints of left arm and lower extremity pain that is consistent with previous sickle cell pain crisis. Patient was treated and evaluated at sickle cell day infusion clinic for this problem on 03/12/2020 with minimal improvement. Patient was advised to return to clinic for further pain management and extended observation. She attributes pain crisis to increased stressors at home and she has run out of her home medications. She says that that has been "doubling up" on both long acting and short acting pain medications, which is the primary reason that she is out of pain medication. Patient rates pain as 9/10 characterized as constant and throbbing. Patient denies any fever, chills, headache, chest pain, shortness of breath, nausea, vomiting, or diarrhea.   Meds: Medications Prior to Admission  Medication Sig Dispense Refill Last Dose  . folic acid (FOLVITE) 1 MG tablet Take 1 tablet (1 mg total) by mouth daily. 30 tablet 11 Past Week at Unknown time  . ibuprofen (ADVIL) 800 MG tablet Take 1 tablet (800 mg total) by mouth every 8 (eight) hours as needed for headache, mild pain, moderate pain or cramping. 30 tablet 6 Past Week at Unknown time  . ondansetron (ZOFRAN) 4 MG tablet Take 4 mg by mouth every 8 (eight) hours as needed for nausea or vomiting.    Past Week at Unknown time  . oxyCODONE ER (XTAMPZA ER) 18 MG C12A Take 18 mg by  mouth every 12 (twelve) hours. 60 capsule 0 Past Week at Unknown time  . oxyCODONE ER (XTAMPZA ER) 18 MG C12A Take 18 mg by mouth every 12 (twelve) hours. 60 capsule 0 Past Week at Unknown time  . [START ON 03/15/2020] Oxycodone HCl 20 MG TABS Take 1 tablet (20 mg total) by mouth every 4 (four) hours as needed. 90 tablet 0 Past Week at Unknown time  . tiZANidine (ZANAFLEX) 4 MG capsule Take 1 capsule (4 mg total) by mouth 3 (three) times daily as needed for muscle spasms. 90 capsule 6 Past Month at Unknown time  . valACYclovir (VALTREX) 500 MG tablet Take Valtrex 500 mg daily for suppression of outbreaks. Take Valtrex 1000 mg daily X 1 week for outbreaks. (Patient taking differently: Take 500 mg by mouth daily.) 90 tablet 11 Past Week at Unknown time  . Vitamin D, Ergocalciferol, (DRISDOL) 1.25 MG (50000 UT) CAPS capsule Take 1 capsule (50,000 Units total) by mouth every 7 (seven) days. (Patient taking differently: Take 50,000 Units by mouth every 7 (seven) days. Take on Sunday) 5 capsule 6 Past Month at Unknown time  . naloxone [facility]) 0.4 MG/ML injection As needed (Patient not taking: Reported on 01/10/2020) 1 mL 0   . ondansetron (ZOFRAN ODT) 4 MG disintegrating tablet Take 1 tablet (4 mg total) by mouth every 8 (eight) hours as needed for nausea or vomiting. (Patient not taking: Reported on 02/10/2020) 20 tablet 0   . voxelotor (OXBRYTA) 500 MG TABS tablet Take 1,500 mg by mouth daily. 90 tablet 11  Allergies: Patient has no known allergies. Past Medical History:  Diagnosis Date  . HSV infection   . Nausea   . Sickle cell anemia (HCC)   . Vitamin B12 deficiency 12/2018  . Vitamin D deficiency    No past surgical history on file. Family History  Problem Relation Age of Onset  . Hypertension Mother   . Sickle cell trait Mother   . Diabetes Father   . Sickle cell trait Father    Social History   Socioeconomic History  . Marital status: Single    Spouse name: Not on file  . Number  of children: Not on file  . Years of education: Not on file  . Highest education level: Not on file  Occupational History  . Not on file  Tobacco Use  . Smoking status: Never Smoker  . Smokeless tobacco: Never Used  Vaping Use  . Vaping Use: Never used  Substance and Sexual Activity  . Alcohol use: Not Currently  . Drug use: Never  . Sexual activity: Yes    Birth control/protection: None  Other Topics Concern  . Not on file  Social History Narrative  . Not on file   Social Determinants of Health   Financial Resource Strain: Not on file  Food Insecurity: Not on file  Transportation Needs: Not on file  Physical Activity: Not on file  Stress: Not on file  Social Connections: Not on file  Intimate Partner Violence: Not on file   Review of Systems  Constitutional: Negative.   HENT: Negative.   Eyes: Negative.   Respiratory: Negative.   Cardiovascular: Negative.   Gastrointestinal: Negative.   Genitourinary: Negative.   Musculoskeletal: Positive for back pain and joint pain.  Skin: Negative.   Neurological: Negative.   Psychiatric/Behavioral: Negative.     Physical Exam: Blood pressure (!) 136/96, pulse (!) 110, temperature 97.7 F (36.5 C), temperature source Temporal, resp. rate 16, SpO2 99 %.  Physical Exam Constitutional:      Appearance: Normal appearance.  Eyes:     Pupils: Pupils are equal, round, and reactive to light.  Cardiovascular:     Rate and Rhythm: Normal rate and regular rhythm.     Pulses: Normal pulses.  Pulmonary:     Effort: Pulmonary effort is normal.  Abdominal:     General: Abdomen is flat. Bowel sounds are normal.  Skin:    General: Skin is warm.  Neurological:     General: No focal deficit present.     Mental Status: She is alert. Mental status is at baseline.  Psychiatric:        Mood and Affect: Mood normal.        Thought Content: Thought content normal.        Judgment: Judgment normal.     Lab results: No results found  for this or any previous visit (from the past 24 hour(s)).  Imaging results:  No results found.   Assessment & Plan: Patient admitted to sickle cell day infusion center for management of pain crisis.  Patient is opiate tolerant Initiate IV dilaudid PCA. Settings of 0.6 mg, 10 minute lockout, and 4 mg/hr IV fluids, 0.45% saline at 100 ml/hr Toradol 15 mg IV times one dose Tylenol 1000 mg by mouth times one dose Reviewed labs from 03/12/2020, largely consistent with patient's baseline.  Pain intensity will be reevaluated in context of functioning and relationship to baseline as care progresses If pain intensity remains elevated and/or sudden change in hemodynamic stability  transition to inpatient services for higher level of care.    Nolon Nations  APRN, MSN, FNP-C Patient Care [facility] [address] [place], [place] [address] [phone]    03/13/2020, 10:02 AM

## 2020-03-13 NOTE — Discharge Summary (Signed)
[facility] Discharge Summary   Patient ID: Stephanie Burch MRN: [id number] DOB/AGE: 38-Mar-1982 38 y.o.  Admit date: 03/13/2020 Discharge date: 03/13/2020  Primary Care Physician:  Kallie Locks, FNP  Admission Diagnoses:  Active Problems:   Sickle cell pain crisis [facility])   Discharge Medications:  Allergies as of 03/13/2020   No Known Allergies     Medication List    TAKE these medications   folic acid 1 MG tablet Commonly known as: FOLVITE Take 1 tablet (1 mg total) by mouth daily.   ibuprofen 800 MG tablet Commonly known as: ADVIL Take 1 tablet (800 mg total) by mouth every 8 (eight) hours as needed for headache, mild pain, moderate pain or cramping.   naloxone 0.4 MG/ML injection Commonly known as: NARCAN As needed   ondansetron 4 MG disintegrating tablet Commonly known as: Zofran ODT Take 1 tablet (4 mg total) by mouth every 8 (eight) hours as needed for nausea or vomiting.   ondansetron 4 MG tablet Commonly known as: ZOFRAN Take 4 mg by mouth every 8 (eight) hours as needed for nausea or vomiting.   Oxbryta 500 MG Tabs tablet Generic drug: voxelotor Take 1,500 mg by mouth daily.   Oxycodone HCl 20 MG Tabs Take 1 tablet (20 mg total) by mouth every 4 (four) hours as needed. Start taking on: March 15, 2020   tiZANidine 4 MG capsule Commonly known as: ZANAFLEX Take 1 capsule (4 mg total) by mouth 3 (three) times daily as needed for muscle spasms.   valACYclovir 500 MG tablet Commonly known as: Valtrex Take Valtrex 500 mg daily for suppression of outbreaks. Take Valtrex 1000 mg daily X 1 week for outbreaks. What changed:   how much to take  how to take this  when to take this  additional instructions   Vitamin D (Ergocalciferol) 1.25 MG (50000 UNIT) Caps capsule Commonly known as: DRISDOL Take 1 capsule (50,000 Units total) by mouth every 7 (seven) days. What changed: additional instructions   Xtampza ER 18 MG C12a Generic  drug: oxyCODONE ER Take 18 mg by mouth every 12 (twelve) hours.   Xtampza ER 18 MG C12a Generic drug: oxyCODONE ER Take 18 mg by mouth every 12 (twelve) hours.        Consults:  None  Significant Diagnostic Studies:  No results found.  History of present illness:  Stephanie Burch is a 40 year old female with a medical history significant for sickle cell disease, chronic pain syndrome, opiate dependence and tolerance, history of depression and anxiety, anemia of chronic disease, and morbid obesity presents with complaints of left arm and lower extremity pain that is consistent with previous sickle cell pain crisis. Patient was treated and evaluated at sickle cell day infusion clinic for this problem on 03/12/2020 with minimal improvement. Patient was advised to return to clinic for further pain management and extended observation. She attributes pain crisis to increased stressors at home and she has run out of her home medications. She says that that has been "doubling up" on both long acting and short acting pain medications, which is the primary reason that she is out of pain medication. Patient rates pain as 9/10 characterized as constant and throbbing. Patient denies any fever, chills, headache, chest pain, shortness of breath, nausea, vomiting, or diarrhea.          [facility] Course: Patient admitted to sickle cell day infusion center for management of pain crisis. All laboratory values previously reviewed,  consistent with patient's baseline. IV Dilaudid PCA initiated with settings of 0.6 mg, 10-minute lockout, and 4 mg/h IV Toradol 15 mg x 1 Tylenol 1000 mg x 1 IV fluids, 0.45% saline at 100 mL/h Pain intensity decreased to 6/10.  Patient does not warrant hospitalization. She is alert, oriented, and ambulating without assistance.  Patient will discharge home in a hemodynamically stable condition.  Discharge instructions: Resume all home medications.   Follow up with  PCP as previously  scheduled.   Discussed the importance of drinking 64 ounces of water daily, dehydration of red blood cells may lead further sickling.   Avoid all stressors that precipitate sickle cell pain crisis.     The patient was given clear instructions to go to ER or return to medical center if symptoms do not improve, worsen or new problems develop.    Physical Exam at Discharge:  BP (!) 111/58 (BP Location: Right Arm)   Pulse 98   Temp 98.1 F (36.7 C) (Temporal)   Resp 15   SpO2 94%   Physical Exam Constitutional:      Appearance: Normal appearance.  Eyes:     Pupils: Pupils are equal, round, and reactive to light.  Cardiovascular:     Rate and Rhythm: Normal rate and regular rhythm.  Pulmonary:     Effort: Pulmonary effort is normal.  Abdominal:     General: Abdomen is flat. Bowel sounds are normal.  Skin:    General: Skin is warm.  Neurological:     General: No focal deficit present.     Mental Status: She is alert. Mental status is at baseline.  Psychiatric:        Mood and Affect: Mood normal.        Thought Content: Thought content normal.      Disposition at Discharge: Discharge disposition: 01-Home or Self Care       Discharge Orders: Discharge Instructions    Discharge patient   Complete by: As directed    Discharge disposition: 01-Home or Self Care   Discharge patient date: 03/13/2020      Condition at Discharge:   Stable  Time spent on Discharge:  Greater than 30 minutes.  Signed: Nolon Nations  APRN, MSN, FNP-C Patient Care [facility] [address] [place], [place] [address] [phone]  03/13/2020, 9:42 PM

## 2020-03-13 NOTE — Progress Notes (Signed)
Patient admitted to the day infusion hospital for sickle cell pain crisis. Patient was also treated in the day hospital yesterday for pain crisis. Initially, patient reported bilateral leg pain rated 8/10. For pain management, patient placed on Dilaudid PCA, given 15 mg Toradol and hydrated with IV fluids. At discharge, patient rated pain at 6/10. Vital signs stable. Discharge instructions given. Patient alert, oriented and ambulatory at discharge.

## 2020-03-16 ENCOUNTER — Encounter: Payer: Self-pay | Admitting: Clinical

## 2020-03-22 ENCOUNTER — Other Ambulatory Visit: Payer: Self-pay | Admitting: Family Medicine

## 2020-03-22 ENCOUNTER — Telehealth (HOSPITAL_COMMUNITY): Payer: Self-pay

## 2020-03-22 ENCOUNTER — Inpatient Hospital Stay (HOSPITAL_COMMUNITY): Admission: RE | Admit: 2020-03-22 | Payer: Medicaid Other | Source: Ambulatory Visit

## 2020-03-22 DIAGNOSIS — F1190 Opioid use, unspecified, uncomplicated: Secondary | ICD-10-CM

## 2020-03-22 DIAGNOSIS — D571 Sickle-cell disease without crisis: Secondary | ICD-10-CM

## 2020-03-22 DIAGNOSIS — G894 Chronic pain syndrome: Secondary | ICD-10-CM

## 2020-03-22 MED ORDER — XTAMPZA ER 18 MG PO C12A: 18.0000 mg | EXTENDED_RELEASE_CAPSULE | Freq: Two times a day (BID) | ORAL | 0 refills | Status: DC

## 2020-03-22 NOTE — Telephone Encounter (Signed)
Spoke with pt about receiving infusion today, pt states she is not feeling well and left a VM with scheduler to r/s. Pt states she wants to r/s to Monday 03/26/20. [place] FNP made aware. Pt informed that scheduler will contact pt and make appointment. Pt verbalized understanding.

## 2020-03-25 ENCOUNTER — Encounter: Payer: Self-pay | Admitting: Family Medicine

## 2020-03-25 ENCOUNTER — Other Ambulatory Visit: Payer: Self-pay | Admitting: Family Medicine

## 2020-03-25 DIAGNOSIS — D571 Sickle-cell disease without crisis: Secondary | ICD-10-CM

## 2020-03-25 DIAGNOSIS — G894 Chronic pain syndrome: Secondary | ICD-10-CM

## 2020-03-25 DIAGNOSIS — F1190 Opioid use, unspecified, uncomplicated: Secondary | ICD-10-CM

## 2020-03-26 ENCOUNTER — Other Ambulatory Visit: Payer: Self-pay | Admitting: Family Medicine

## 2020-03-26 ENCOUNTER — Other Ambulatory Visit: Payer: Self-pay

## 2020-03-26 ENCOUNTER — Telehealth: Payer: Self-pay | Admitting: Family Medicine

## 2020-03-26 ENCOUNTER — Non-Acute Institutional Stay (HOSPITAL_COMMUNITY)
Admission: RE | Admit: 2020-03-26 | Discharge: 2020-03-26 | Disposition: A | Discharge status: Home / Self Care | Payer: Medicaid Other | Source: Ambulatory Visit | Attending: Internal Medicine | Admitting: Internal Medicine

## 2020-03-26 DIAGNOSIS — G894 Chronic pain syndrome: Secondary | ICD-10-CM

## 2020-03-26 DIAGNOSIS — F1190 Opioid use, unspecified, uncomplicated: Secondary | ICD-10-CM

## 2020-03-26 DIAGNOSIS — D571 Sickle-cell disease without crisis: Secondary | ICD-10-CM

## 2020-03-26 MED ORDER — SODIUM CHLORIDE 0.9 % IV SOLN: INTRAVENOUS | Status: DC | PRN

## 2020-03-26 MED ORDER — SODIUM CHLORIDE 0.9 % IV SOLN
500.0000 mg | Freq: Once | INTRAVENOUS | Status: AC
  Administered 2020-03-26: 500 mg via INTRAVENOUS
  Filled 2020-03-26: qty 50

## 2020-03-26 MED ORDER — OXYCODONE HCL 20 MG PO TABS: 20.0000 mg | ORAL_TABLET | ORAL | 0 refills | Status: DC | PRN

## 2020-03-26 MED ORDER — XTAMPZA ER 18 MG PO C12A
18.0000 mg | EXTENDED_RELEASE_CAPSULE | Freq: Two times a day (BID) | ORAL | 0 refills | Status: DC
Start: 2020-03-26 — End: 2020-04-20

## 2020-03-26 NOTE — Progress Notes (Signed)
PATIENT CARE CENTER NOTE  Diagnosis: Sickle Cell Anemia    Provider: Hollis, [place], FNP   Procedure: [facility] infusion    Note: Patient received Adakveo infusion via PIV. Tolerated well with no adverse reaction. Vital signs stable. Observed patient for 15 minutes post-infusion. AVS offered but patient refused. Patient alert, oriented and ambulatory at discharge.

## 2020-03-26 NOTE — Telephone Encounter (Signed)
ADAKVEO 675 MG not in stock per [facility].  Have only 500 mg and not enough vials to make enough to fill.  Pharmacy says they will call [facility] directly.

## 2020-03-26 NOTE — Telephone Encounter (Signed)
PRIOR AUTH needed for: oxyCODONE ER (XTAMPZA ER) 18 MG C12A   Per pt phone call, pt already paid for small supply while waiting for prior auth. [facility] attempted to gain apprvl.

## 2020-03-26 NOTE — Telephone Encounter (Signed)
Pleas see request

## 2020-03-28 NOTE — Telephone Encounter (Signed)
This was approved on yesterday. Pt has pick up medication per [facility]

## 2020-03-28 NOTE — Telephone Encounter (Signed)
Has this been resolved?

## 2020-04-03 ENCOUNTER — Ambulatory Visit: Payer: Medicare Other | Admitting: Physical Therapy

## 2020-04-09 ENCOUNTER — Other Ambulatory Visit: Payer: Self-pay | Admitting: Family Medicine

## 2020-04-09 DIAGNOSIS — G894 Chronic pain syndrome: Secondary | ICD-10-CM

## 2020-04-09 DIAGNOSIS — D571 Sickle-cell disease without crisis: Secondary | ICD-10-CM

## 2020-04-09 DIAGNOSIS — F1190 Opioid use, unspecified, uncomplicated: Secondary | ICD-10-CM

## 2020-04-10 ENCOUNTER — Other Ambulatory Visit: Payer: Self-pay | Admitting: Family Medicine

## 2020-04-10 DIAGNOSIS — G894 Chronic pain syndrome: Secondary | ICD-10-CM

## 2020-04-10 DIAGNOSIS — D571 Sickle-cell disease without crisis: Secondary | ICD-10-CM

## 2020-04-10 DIAGNOSIS — F1190 Opioid use, unspecified, uncomplicated: Secondary | ICD-10-CM

## 2020-04-10 DIAGNOSIS — M62838 Other muscle spasm: Secondary | ICD-10-CM

## 2020-04-10 MED ORDER — OXYCODONE HCL 20 MG PO TABS: 20.0000 mg | ORAL_TABLET | ORAL | 0 refills | Status: DC | PRN

## 2020-04-12 ENCOUNTER — Telehealth: Payer: Self-pay

## 2020-04-12 NOTE — Telephone Encounter (Signed)
BIN [id number], PCN [id number],  ID [id number]. [employer] [phone].  Spoke with insurance regarding prior auth for Oxycodone IR 20mg .  Reference .

## 2020-04-19 ENCOUNTER — Other Ambulatory Visit: Payer: Self-pay

## 2020-04-19 ENCOUNTER — Other Ambulatory Visit: Payer: Self-pay | Admitting: Family Medicine

## 2020-04-19 ENCOUNTER — Ambulatory Visit: Payer: Medicare Other | Admitting: Physical Therapy

## 2020-04-19 ENCOUNTER — Ambulatory Visit: Payer: Medicare Other | Attending: Family Medicine | Admitting: Physical Therapy

## 2020-04-19 ENCOUNTER — Encounter: Payer: Self-pay | Admitting: Physical Therapy

## 2020-04-19 DIAGNOSIS — M79604 Pain in right leg: Secondary | ICD-10-CM

## 2020-04-19 DIAGNOSIS — G894 Chronic pain syndrome: Secondary | ICD-10-CM

## 2020-04-19 DIAGNOSIS — M79605 Pain in left leg: Secondary | ICD-10-CM | POA: Diagnosis present

## 2020-04-19 DIAGNOSIS — R2689 Other abnormalities of gait and mobility: Secondary | ICD-10-CM | POA: Diagnosis present

## 2020-04-19 DIAGNOSIS — D571 Sickle-cell disease without crisis: Secondary | ICD-10-CM

## 2020-04-19 DIAGNOSIS — M6281 Muscle weakness (generalized): Secondary | ICD-10-CM | POA: Diagnosis present

## 2020-04-19 DIAGNOSIS — F1190 Opioid use, unspecified, uncomplicated: Secondary | ICD-10-CM

## 2020-04-19 NOTE — Patient Instructions (Signed)
Access Code: [email or web address]: [email or web address]: 03/17/2022Prepared by: Onalee Hua CarrollExercises  Supine Quad Set - 1 x daily - 7 x weekly - 3 sets - 10 reps  Hooklying Isometric Clamshell - 1 x daily - 7 x weekly - 3 sets - 10 reps  Supine Lower Trunk Rotation - 1 x daily - 7 x weekly - 3 sets - 10 reps

## 2020-04-19 NOTE — Therapy (Signed)
[facility] [address] [place], [place], [address] Phone: [phone]   Fax:  [phone]  Physical Therapy Treatment  Patient Details  Name: Stephanie Burch MRN: [id number] Date of Birth: [birth year] Referring Provider (PT): [name] NP   Encounter Date: 04/19/2020   PT End of Session - 04/19/20 1450    Visit Number 1    Number of Visits 16    Date for PT Re-Evaluation 06/14/20    PT Start Time 1415    PT Stop Time 1455    PT Time Calculation (min) 40 min    Activity Tolerance Patient tolerated treatment well    Behavior During Therapy [facility] for tasks assessed/performed           Past Medical History:  Diagnosis Date  . HSV infection   . Nausea   . Sickle cell anemia (HCC)   . Vitamin B12 deficiency 12/2018  . Vitamin D deficiency     History reviewed. No pertinent surgical history.  There were no vitals filed for this visit.   Subjective Assessment - 04/19/20 1422    Subjective Patient has suffered from sickle cell pain her whole life. Over the past few years the pain has becoem more cisntent. She has the most pain in her distal quadruceps area. She also has pain in her elbows. She has pain in her back which is more of a throbbing type pain.    Pertinent History tachycarida ( when in crisis)    How long can you stand comfortably? No change    How long can you walk comfortably? No change    Currently in Pain? Yes    Pain Score 10-Worst pain ever   Not hurting now but can reach a 10/10   Pain Location Leg    Pain Orientation Left;Right    Pain Descriptors / Indicators Aching    Pain Type Chronic pain    Pain Radiating Towards radiates through the quads but worse int he distal quads    Pain Onset More than a month ago    Pain Frequency Constant    Aggravating Factors  just comes on a t times, when she is too active it can get worse    Pain Relieving Factors mediciane sometimes, hot showers, heating pads     Effect of Pain on Daily Activities difficultyperfroming daily activity when she is in signifcant pain              [facility] PT Assessment - 04/19/20 0001      Assessment   Medical Diagnosis Sickle Cell/ Chronic Pain    Referring Provider (PT) [name] NP    Onset Date/Surgical Date --   Increased intnsity and frequncy a few years ago   Hand Dominance Right    Next MD Visit first week in April    Prior Therapy None      Precautions   Precautions None      Restrictions   Weight Bearing Restrictions No      Balance Screen   Has the patient fallen in the past 6 months Yes    How many times? 1   goinf up the stairs   Has the patient had a decrease in activity level because of a fear of falling?  Yes    Is the patient reluctant to leave their home because of a fear of falling?  Yes      Home Environment   Additional Comments 2 steps into the porch  then has to go upstiars/ downstairs in her house      Prior Function   Level of Independence Independent    Vocation On disability    Leisure Would like to be able to go for walks, dancing      Cognition   Overall Cognitive Status Within Functional Limits for tasks assessed    Attention Focused    Focused Attention Appears intact    Memory Appears intact    Awareness Appears intact    Problem Solving Appears intact      Observation/Other Assessments   Focus on Therapeutic Outcomes (FOTO)  medicaid      Sensation   Light Touch Appears Intact      Coordination   Gross Motor Movements are Fluid and Coordinated Yes    Fine Motor Movements are Fluid and Coordinated Yes      Posture/Postural Control   Posture Comments rounded shoulders and forward head      ROM / Strength   AROM / PROM / Strength AROM;PROM;Strength      AROM   Overall AROM Comments limited active flexion and IR      PROM   Overall PROM Comments limited left flexion and IR      Strength   Strength Assessment Site Hip;Knee    Right/Left Hip  Left;Right    Right Hip Flexion 4+/5    Right Hip ABduction 5/5    Right Hip ADduction 5/5    Left Hip Flexion 4/5    Left Hip ABduction 4+/5    Left Hip ADduction 5/5    Right/Left Knee Left    Left Knee Flexion 5/5    Left Knee Extension 4+/5      Palpation   Palpation comment no tenderness today. When she is in crisis she reports significant pain      Ambulation/Gait   Gait Comments decreased bilateral hip flexion; lateral movement with gait                         [facility] Adult PT Treatment/Exercise - 04/19/20 0001      Knee/Hip Exercises: Supine   Quad Sets Limitations x10 each leg with cuing    Other Supine Knee/Hip Exercises supine clamshell x10 advised to progress sets if tolerated    Other Supine Knee/Hip Exercises lower trunk rotation 2x10                  PT Education - 04/19/20 1547    Education Details HEP and symptom mangement    Person(s) Educated Patient    Methods Explanation;Demonstration;Tactile cues;Verbal cues    Comprehension Verbalized understanding;Returned demonstration;Verbal cues required;Tactile cues required            PT Short Term Goals - 04/19/20 1557      PT SHORT TERM GOAL #1   Title STG =LTG             PT Long Term Goals - 04/19/20 1557      PT LONG TERM GOAL #1   Title Patient will be indepencnet with basic HEP for LE strengthening    Baseline No HEP    Time 3    Period Weeks    Status New    Target Date 05/10/20      PT LONG TERM GOAL #2   Title Patient will demonstrate 5/5 gross bilateral LE strength in order to perfrom ADL's    Baseline right hip flexion 4+/5; left hip flexion  4/5 left knee extnesion 4+/5 left hip abduction 4+/5    Time 6    Period Weeks    Status New    Target Date 05/31/20      PT LONG TERM GOAL #3   Title Patient will perfrom daily activity without increasing pain in distal quads    Baseline daily tasks can cause a crisis    Time 6    Period Weeks    Status New     Target Date 05/31/20      PT LONG TERM GOAL #4   Title Patient will go up/down 8 stpes without pain    Baseline pain going up and down stpes    Time 6    Period Weeks    Status New    Target Date 05/31/20                 Plan - 04/19/20 1549    Clinical Impression Statement Patient is a 40 year old female with sickle cell anemia. She has pain in her distal quads, elbows, and lower back. Her pain has become more consistent over the past few years. She has increased weakness in right hip andknee compared to the right. She also has increased sitffness on the left compared to the right. She would benefit from skilled therapy to improve ednurance and ability to perfrom ADL's and hopefully to decrease pain with activity. She was given a basic HEP to work on at home. she was encouraged to get into a routine and we will expand HEP absed on her espose to lgiht exercises.    Personal Factors and Comorbidities Comorbidity 1;Comorbidity 2    Comorbidities sickle cell, obesity    Examination-Activity Limitations Squat;Stairs;Stand;Lift;Locomotion Level    Examination-Participation Restrictions Community Activity;Cleaning;Meal Prep;Shop;Laundry    Stability/Clinical Decision Making Evolving/Moderate complexity    Clinical Decision Making Moderate    Rehab Potential Good    PT Frequency 1x / week    PT Duration 6 weeks    PT Treatment/Interventions ADLs/Self Care Home Management;Electrical Stimulation;Cryotherapy;Traction;Gait training;Stair training;Therapeutic activities;Therapeutic exercise;Functional mobility training;Neuromuscular re-education;Patient/family education;Manual techniques;Passive range of motion    PT Next Visit Plan : [id number] Exercises  .Supine Quad Set - 1 x daily - 7 x weekly - 3 sets - 10 reps  .Hooklying Isometric Clamshell - 1 x daily - 7 x weekly - 3 sets - 10 reps  .Supine Lower Trunk Rotation - 1 x daily - 7 x weekly - 3 sets - 10 reps    Consulted and Agree with Plan of  Care Patient           Patient will benefit from skilled therapeutic intervention in order to improve the following deficits and impairments:  Abnormal gait,Decreased range of motion,Difficulty walking,Obesity,Pain,Decreased mobility,Decreased strength,Decreased activity tolerance,Decreased endurance  Visit Diagnosis: Other abnormalities of gait and mobility  Pain in left leg  Pain in right leg  Muscle weakness (generalized)     Problem List Patient Active Problem List   Diagnosis Date Noted  . Pulmonary edema 11/11/2019  . Hypoxia   . Poor venous access 11/09/2019  . HCAP (healthcare-associated pneumonia) 11/07/2019  . Sepsis (HCC) 11/07/2019  . Acute kidney injury (HCC) 11/07/2019  . History of COVID-19 11/07/2019  . COVID-19 virus detected 10/22/2019  . Sore throat due to virus 10/22/2019  . Urinary frequency 04/20/2019  . Vaginal odor 04/20/2019  . Chest pain varying with breathing   . Fever and chills   . Vitamin D deficiency 11/15/2018  .  Nausea 11/15/2018  . Abnormal pulse oximetry   . Hypokalemia 09/20/2018  . Medication management 09/14/2018  . Muscle spasms of both lower extremities 08/25/2018  . Subjective visual disturbance of right eye 08/25/2018  . 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 Chronic pain syndrome 03/16/2018  . Chronic, continuous use of opioids 03/16/2018  . Tachycardia with heart rate 100-120 beats per minute   . Sickle cell crisis (HCC) 10/19/2017  . Leukocytosis 10/19/2017  . Thrombocytopenia (HCC) 10/19/2017  . Morbidly obese (HCC) 10/19/2017  . Sickle cell pain crisis (HCC) 10/15/2017    Dessie Coma PT DPT  04/19/2020, 4:08 PM  [facility] [address] [place], [place], [address] Phone: [phone]   Fax:  [phone]  Name: Stephanie Burch MRN: [id number] Date of Birth: 05-20-1980

## 2020-04-20 ENCOUNTER — Telehealth: Payer: Self-pay

## 2020-04-20 ENCOUNTER — Other Ambulatory Visit: Payer: Self-pay | Admitting: Family Medicine

## 2020-04-20 DIAGNOSIS — F1190 Opioid use, unspecified, uncomplicated: Secondary | ICD-10-CM

## 2020-04-20 DIAGNOSIS — D571 Sickle-cell disease without crisis: Secondary | ICD-10-CM

## 2020-04-20 DIAGNOSIS — G894 Chronic pain syndrome: Secondary | ICD-10-CM

## 2020-04-20 MED ORDER — XTAMPZA ER 18 MG PO C12A: 18.0000 mg | EXTENDED_RELEASE_CAPSULE | Freq: Two times a day (BID) | ORAL | 0 refills | Status: DC

## 2020-04-20 NOTE — Telephone Encounter (Signed)
Wanted to know if Prior auth was done for  her refill xtampza .

## 2020-04-23 ENCOUNTER — Other Ambulatory Visit: Payer: Self-pay | Admitting: Family Medicine

## 2020-04-23 DIAGNOSIS — F1190 Opioid use, unspecified, uncomplicated: Secondary | ICD-10-CM

## 2020-04-23 DIAGNOSIS — G894 Chronic pain syndrome: Secondary | ICD-10-CM

## 2020-04-23 DIAGNOSIS — D571 Sickle-cell disease without crisis: Secondary | ICD-10-CM

## 2020-04-23 NOTE — Telephone Encounter (Signed)
Medication is not due to be refilled until 3/21/20222. Once pharmacy submits to be refilled they will inform our clinic if Prior Auth is required. They/we are not able to assess PA prior. PCP and several staff members have discussed this process with patient multiple times. We will re-assess on Monday, 04/23/2020.

## 2020-04-24 ENCOUNTER — Other Ambulatory Visit: Payer: Self-pay | Admitting: Family Medicine

## 2020-04-24 DIAGNOSIS — F1190 Opioid use, unspecified, uncomplicated: Secondary | ICD-10-CM

## 2020-04-24 DIAGNOSIS — D571 Sickle-cell disease without crisis: Secondary | ICD-10-CM

## 2020-04-24 DIAGNOSIS — G894 Chronic pain syndrome: Secondary | ICD-10-CM

## 2020-04-24 MED ORDER — OXYCODONE HCL 20 MG PO TABS: 20.0000 mg | ORAL_TABLET | ORAL | 0 refills | Status: DC | PRN

## 2020-04-26 ENCOUNTER — Other Ambulatory Visit: Payer: Self-pay

## 2020-04-26 ENCOUNTER — Telehealth: Payer: Self-pay

## 2020-04-26 ENCOUNTER — Non-Acute Institutional Stay (HOSPITAL_COMMUNITY)
Admission: RE | Admit: 2020-04-26 | Discharge: 2020-04-26 | Disposition: A | Discharge status: Home / Self Care | Payer: Medicare Other | Source: Ambulatory Visit | Attending: Internal Medicine | Admitting: Internal Medicine

## 2020-04-26 ENCOUNTER — Encounter: Payer: Self-pay | Admitting: Family Medicine

## 2020-04-26 DIAGNOSIS — D571 Sickle-cell disease without crisis: Secondary | ICD-10-CM | POA: Insufficient documentation

## 2020-04-26 MED ORDER — SODIUM CHLORIDE 0.9 % IV SOLN
INTRAVENOUS | Status: DC | PRN
  Administered 2020-04-26: 250 mL via INTRAVENOUS

## 2020-04-26 MED ORDER — SODIUM CHLORIDE 0.9 % IV SOLN
680.0000 mg | Freq: Once | INTRAVENOUS | Status: AC
  Administered 2020-04-26: 680 mg via INTRAVENOUS
  Filled 2020-04-26: qty 68

## 2020-04-26 NOTE — Progress Notes (Signed)
PATIENT CARE CENTER NOTE  Diagnosis: Sickle Cell Anemia    Provider: Hollis, [place], FNP   Procedure: N[facility]ie [facility] infusion    Note: Patient received [place]o infusion via PIV. Patient tolerated well with no adverse reaction. Observed patient for 30 minutes post-infusion. Vital signs stable. Patient to come back every 4 weeks for infusion. Patient alert, oriented and ambulatory at discharge.

## 2020-04-26 NOTE — Telephone Encounter (Signed)
The order is in

## 2020-04-26 NOTE — Discharge Instructions (Signed)
Crizanlizumab injection What is this medicine? CRIZANLIZUMAB (KRIZ an LIZ ue mab) is a selectin blocker. This medicine is used to reduce how often sickle cell crises happen. This medicine may be used for other purposes; ask your health care provider or pharmacist if you have questions. COMMON BRAND NAME(S): ADAKVEO What should I tell my health care provider before I take this medicine? They need to know if you have any of these conditions:  an unusual or allergic reaction to crizanlizumab, other medicines, foods, dyes or preservatives  pregnant or trying to get pregnant  breast-feeding How should I use this medicine? This medicine is for infusion into a vein. It is given by a healthcare professional in a hospital or clinic setting. Talk to your pediatrician regarding the use of this medicine in children. While this drug may be prescribed for children as young as 16 years for selected conditions, precautions do apply. Overdosage: If you think you have taken too much of this medicine contact a poison control center or emergency room at once. NOTE: This medicine is only for you. Do not share this medicine with others. What if I miss a dose? Keep appointments for follow-up doses. It is important not to miss your dose. Call your doctor or healthcare professional if you are unable to keep an appointment. What may interact with this medicine? Interactions are not expected. This list may not describe all possible interactions. Give your health care provider a list of all the medicines, herbs, non-prescription drugs, or dietary supplements you use. Also tell them if you smoke, drink alcohol, or use illegal drugs. Some items may interact with your medicine. What should I watch for while using this medicine? Your condition will be monitored carefully while you are receiving this medicine. What side effects may I notice from receiving this medicine? Side effects that you should report to your doctor or  health care professional as soon as possible:  allergic reactions like skin rash, itching or hives; swelling of the face, lips, or tongue  signs and symptoms of an infusion reaction such as fever; chills or shivering; nausea or vomiting; tiredness; dizziness; sweating; shortness of breath or wheezing Side effects that usually do not require medical attention (report these to your doctor or health care professional if they continue or are bothersome):  back pain  diarrhea  joint pain  stomach pain This list may not describe all possible side effects. Call your doctor for medical advice about side effects. You may report side effects to FDA at 1-800-FDA-1088. Where should I keep my medicine? This medicine is given in a hospital or clinic and will not be stored at home. NOTE: This sheet is a summary. It may not cover all possible information. If you have questions about this medicine, talk to your doctor, pharmacist, or health care provider.  2021 Elsevier/Gold Standard (2017-12-23 09:49:06)  

## 2020-04-26 NOTE — Telephone Encounter (Signed)
Oxycodone 20 mg  patient is out and needs it today

## 2020-04-27 ENCOUNTER — Telehealth: Payer: Self-pay

## 2020-04-27 ENCOUNTER — Other Ambulatory Visit: Payer: Self-pay | Admitting: Family Medicine

## 2020-04-27 DIAGNOSIS — F1190 Opioid use, unspecified, uncomplicated: Secondary | ICD-10-CM

## 2020-04-27 DIAGNOSIS — D571 Sickle-cell disease without crisis: Secondary | ICD-10-CM

## 2020-04-27 DIAGNOSIS — G894 Chronic pain syndrome: Secondary | ICD-10-CM

## 2020-04-27 MED ORDER — OXYCODONE HCL 20 MG PO TABS: 20.0000 mg | ORAL_TABLET | ORAL | 0 refills | Status: DC | PRN

## 2020-04-27 NOTE — Telephone Encounter (Signed)
Sent meds to the pharmacy. 

## 2020-04-27 NOTE — Telephone Encounter (Signed)
Spoke to pharmacy today. Patient refill will be filled after stock comes in after lunch today. Also spoke with patient to inform.

## 2020-04-27 NOTE — Telephone Encounter (Signed)
Med Refill :  Oxycodone 20 mg . [facility] [address] in [place]

## 2020-05-07 ENCOUNTER — Encounter: Payer: Self-pay | Admitting: Family Medicine

## 2020-05-08 ENCOUNTER — Other Ambulatory Visit: Payer: Self-pay | Admitting: Family Medicine

## 2020-05-08 DIAGNOSIS — G894 Chronic pain syndrome: Secondary | ICD-10-CM

## 2020-05-08 DIAGNOSIS — D5700 Hb-SS disease with crisis, unspecified: Secondary | ICD-10-CM

## 2020-05-08 DIAGNOSIS — Z79899 Other long term (current) drug therapy: Secondary | ICD-10-CM

## 2020-05-08 MED ORDER — TIZANIDINE HCL 4 MG PO CAPS
4.0000 mg | ORAL_CAPSULE | Freq: Three times a day (TID) | ORAL | 6 refills | Status: DC | PRN
Start: 2020-05-08 — End: 2020-08-21

## 2020-05-09 ENCOUNTER — Telehealth: Payer: Self-pay

## 2020-05-09 ENCOUNTER — Other Ambulatory Visit: Payer: Self-pay | Admitting: Family Medicine

## 2020-05-09 DIAGNOSIS — D571 Sickle-cell disease without crisis: Secondary | ICD-10-CM

## 2020-05-09 DIAGNOSIS — F1190 Opioid use, unspecified, uncomplicated: Secondary | ICD-10-CM

## 2020-05-09 DIAGNOSIS — G894 Chronic pain syndrome: Secondary | ICD-10-CM

## 2020-05-09 MED ORDER — OXYCODONE HCL 20 MG PO TABS: 20.0000 mg | ORAL_TABLET | ORAL | 0 refills | Status: DC | PRN

## 2020-05-09 NOTE — Telephone Encounter (Signed)
Please see refill request.

## 2020-05-09 NOTE — Telephone Encounter (Signed)
Oxycodone 20 mg °

## 2020-05-10 ENCOUNTER — Ambulatory Visit: Payer: Medicare Other | Admitting: Physical Therapy

## 2020-05-11 ENCOUNTER — Other Ambulatory Visit: Payer: Self-pay | Admitting: Family Medicine

## 2020-05-11 ENCOUNTER — Non-Acute Institutional Stay (HOSPITAL_COMMUNITY)
Admission: AD | Admit: 2020-05-11 | Discharge: 2020-05-11 | Disposition: A | Discharge status: Home / Self Care | Payer: Medicare Other | Source: Ambulatory Visit | Attending: Internal Medicine | Admitting: Internal Medicine

## 2020-05-11 ENCOUNTER — Telehealth (HOSPITAL_COMMUNITY): Payer: Self-pay | Admitting: *Deleted

## 2020-05-11 ENCOUNTER — Ambulatory Visit (INDEPENDENT_AMBULATORY_CARE_PROVIDER_SITE_OTHER): Payer: Medicare Other | Admitting: Family Medicine

## 2020-05-11 DIAGNOSIS — D57219 Sickle-cell/Hb-C disease with crisis, unspecified: Secondary | ICD-10-CM | POA: Diagnosis present

## 2020-05-11 DIAGNOSIS — Z79891 Long term (current) use of opiate analgesic: Secondary | ICD-10-CM | POA: Diagnosis not present

## 2020-05-11 DIAGNOSIS — D5700 Hb-SS disease with crisis, unspecified: Secondary | ICD-10-CM | POA: Diagnosis present

## 2020-05-11 DIAGNOSIS — G894 Chronic pain syndrome: Secondary | ICD-10-CM | POA: Insufficient documentation

## 2020-05-11 DIAGNOSIS — F1190 Opioid use, unspecified, uncomplicated: Secondary | ICD-10-CM

## 2020-05-11 DIAGNOSIS — Z09 Encounter for follow-up examination after completed treatment for conditions other than malignant neoplasm: Secondary | ICD-10-CM

## 2020-05-11 DIAGNOSIS — D638 Anemia in other chronic diseases classified elsewhere: Secondary | ICD-10-CM | POA: Insufficient documentation

## 2020-05-11 DIAGNOSIS — F419 Anxiety disorder, unspecified: Secondary | ICD-10-CM

## 2020-05-11 DIAGNOSIS — D571 Sickle-cell disease without crisis: Secondary | ICD-10-CM

## 2020-05-11 LAB — CBC WITH DIFFERENTIAL/PLATELET
Abs Immature Granulocytes: 0.03 10*3/uL (ref 0.00–0.07)
Basophils Absolute: 0 10*3/uL (ref 0.0–0.1)
Basophils Relative: 0 %
Eosinophils Absolute: 0.2 10*3/uL (ref 0.0–0.5)
Eosinophils Relative: 2 %
HCT: 34.6 % — ABNORMAL LOW (ref 36.0–46.0)
Hemoglobin: 11.7 g/dL — ABNORMAL LOW (ref 12.0–15.0)
Immature Granulocytes: 0 %
Lymphocytes Relative: 19 %
Lymphs Abs: 1.8 10*3/uL (ref 0.7–4.0)
MCH: 23.1 pg — ABNORMAL LOW (ref 26.0–34.0)
MCHC: 33.8 g/dL (ref 30.0–36.0)
MCV: 68.2 fL — ABNORMAL LOW (ref 80.0–100.0)
Monocytes Absolute: 0.6 10*3/uL (ref 0.1–1.0)
Monocytes Relative: 6 %
Neutro Abs: 6.9 10*3/uL (ref 1.7–7.7)
Neutrophils Relative %: 73 %
Platelets: 268 10*3/uL (ref 150–400)
RBC: 5.07 MIL/uL (ref 3.87–5.11)
RDW: 15.5 % (ref 11.5–15.5)
WBC: 9.5 10*3/uL (ref 4.0–10.5)
nRBC: 0 % (ref 0.0–0.2)

## 2020-05-11 LAB — COMPREHENSIVE METABOLIC PANEL
ALT: 13 U/L (ref 0–44)
AST: 13 U/L — ABNORMAL LOW (ref 15–41)
Albumin: 4.1 g/dL (ref 3.5–5.0)
Alkaline Phosphatase: 78 U/L (ref 38–126)
Anion gap: 8 (ref 5–15)
BUN: 9 mg/dL (ref 6–20)
CO2: 24 mmol/L (ref 22–32)
Calcium: 9.3 mg/dL (ref 8.9–10.3)
Chloride: 110 mmol/L (ref 98–111)
Creatinine, Ser: 0.61 mg/dL (ref 0.44–1.00)
GFR, Estimated: >60 mL/min (ref >60)
Glucose, Bld: 100 mg/dL — ABNORMAL HIGH (ref 70–99)
Potassium: 4.1 mmol/L (ref 3.5–5.1)
Sodium: 142 mmol/L (ref 135–145)
Total Bilirubin: 0.8 mg/dL (ref 0.3–1.2)
Total Protein: 7.9 g/dL (ref 6.5–8.1)

## 2020-05-11 LAB — RETICULOCYTES
Immature Retic Fract: 25.6 % — ABNORMAL HIGH (ref 2.3–15.9)
RBC.: 4.97 MIL/uL (ref 3.87–5.11)
Retic Count, Absolute: 143.1 10*3/uL (ref 19.0–186.0)
Retic Ct Pct: 2.9 % (ref 0.4–3.1)

## 2020-05-11 LAB — RAPID URINE DRUG SCREEN, HOSP PERFORMED
Amphetamines: NOT DETECTED
Barbiturates: NOT DETECTED
Benzodiazepines: NOT DETECTED
Cocaine: NOT DETECTED
Opiates: POSITIVE — AB
Tetrahydrocannabinol: NOT DETECTED

## 2020-05-11 LAB — PREGNANCY, URINE: Preg Test, Ur: NEGATIVE

## 2020-05-11 MED ORDER — ACETAMINOPHEN 500 MG PO TABS
1000.0000 mg | ORAL_TABLET | Freq: Once | ORAL | Status: AC
  Administered 2020-05-11: 1000 mg via ORAL
  Filled 2020-05-11: qty 2

## 2020-05-11 MED ORDER — DIPHENHYDRAMINE HCL 25 MG PO CAPS
25.0000 mg | ORAL_CAPSULE | ORAL | Status: DC | PRN
  Filled 2020-05-11: qty 1

## 2020-05-11 MED ORDER — NALOXONE HCL 0.4 MG/ML IJ SOLN: 0.4000 mg | INTRAMUSCULAR | Status: DC | PRN

## 2020-05-11 MED ORDER — ONDANSETRON HCL 4 MG/2ML IJ SOLN
4.0000 mg | Freq: Four times a day (QID) | INTRAMUSCULAR | Status: DC | PRN
  Administered 2020-05-11: 4 mg via INTRAVENOUS
  Filled 2020-05-11: qty 2

## 2020-05-11 MED ORDER — KETOROLAC TROMETHAMINE 30 MG/ML IJ SOLN
15.0000 mg | Freq: Once | INTRAMUSCULAR | Status: AC
  Administered 2020-05-11: 15 mg via INTRAVENOUS
  Filled 2020-05-11: qty 1

## 2020-05-11 MED ORDER — HYDROMORPHONE 1 MG/ML IV SOLN
INTRAVENOUS | Status: DC
  Administered 2020-05-11: 12 mg via INTRAVENOUS
  Administered 2020-05-11: 30 mg via INTRAVENOUS
  Filled 2020-05-11: qty 30

## 2020-05-11 MED ORDER — SODIUM CHLORIDE 0.9% FLUSH: 9.0000 mL | INTRAVENOUS | Status: DC | PRN

## 2020-05-11 MED ORDER — SODIUM CHLORIDE 0.45 % IV SOLN: INTRAVENOUS | Status: DC

## 2020-05-11 NOTE — Discharge Instructions (Signed)
Sickle Cell Anemia, Adult  Sickle cell anemia is a condition where your red blood cells are shaped like sickles. Red blood cells carry oxygen through the body. Sickle-shaped cells do not live as long as normal red blood cells. They also clump together and block blood from flowing through the blood vessels. This prevents the body from getting enough oxygen. Sickle cell anemia causes organ damage and pain. It also increases the risk of infection. Follow these instructions at home: Medicines  Take over-the-counter and prescription medicines only as told by your doctor.  If you were prescribed an antibiotic medicine, take it as told by your doctor. Do not stop taking the antibiotic even if you start to feel better.  If you develop a fever, do not take medicines to lower the fever right away. Tell your doctor about the fever. Managing pain, stiffness, and swelling  Try these methods to help with pain: ? Use a heating pad. ? Take a warm bath. ? Distract yourself, such as by watching TV. Eating and drinking  Drink enough fluid to keep your pee (urine) clear or pale yellow. Drink more in hot weather and during exercise.  Limit or avoid alcohol.  Eat a healthy diet. Eat plenty of fruits, vegetables, whole grains, and lean protein.  Take vitamins and supplements as told by your doctor. Traveling  When traveling, keep these with you: ? Your medical information. ? The names of your doctors. ? Your medicines.  If you need to take an airplane, talk to your doctor first. Activity  Rest often.  Avoid exercises that make your heart beat much faster, such as jogging. General instructions  Do not use products that have nicotine or tobacco, such as cigarettes and e-cigarettes. If you need help quitting, ask your doctor.  Consider wearing a medical alert bracelet.  Avoid being in high places (high altitudes), such as mountains.  Avoid very hot or cold temperatures.  Avoid places where the  temperature changes a lot.  Keep all follow-up visits as told by your doctor. This is important. Contact a doctor if:  A joint hurts.  Your feet or hands hurt or swell.  You feel tired (fatigued). Get help right away if:  You have symptoms of infection. These include: ? Fever. ? Chills. ? Being very tired. ? Irritability. ? Poor eating. ? Throwing up (vomiting).  You feel dizzy or faint.  You have new stomach pain, especially on the left side.  You have a an erection (priapism) that lasts more than 4 hours.  You have numbness in your arms or legs.  You have a hard time moving your arms or legs.  You have trouble talking.  You have pain that does not go away when you take medicine.  You are short of breath.  You are breathing fast.  You have a long-term cough.  You have pain in your chest.  You have a bad headache.  You have a stiff neck.  Your stomach looks bloated even though you did not eat much.  Your skin is pale.  You suddenly cannot see well. Summary  Sickle cell anemia is a condition where your red blood cells are shaped like sickles.  Follow your doctor's advice on ways to manage pain, food to eat, activities to do, and steps to take for safe travel.  Get medical help right away if you have any signs of infection, such as a fever. This information is not intended to replace advice given to you by   your health care provider. Make sure you discuss any questions you have with your health care provider. Document Revised: 06/16/2019 Document Reviewed: 06/16/2019 Elsevier Patient Education  2021 [employer]  

## 2020-05-11 NOTE — Progress Notes (Signed)
[facility]  Established Patient Office Visit  Subjective:  Patient ID: Stephanie Burch, female    DOB: [birth year]  Age: 40 y.o. MRN: [id number]  CC: No chief complaint on file.   HPI Stephanie Burch is a 40 year old female who presents for Follow Up today.   Patient Active Problem List   Diagnosis Date Noted  . Pulmonary edema 11/11/2019  . Hypoxia   . Poor venous access 11/09/2019  . HCAP (healthcare-associated pneumonia) 11/07/2019  . Sepsis (HCC) 11/07/2019  . Acute kidney injury (HCC) 11/07/2019  . History of COVID-19 11/07/2019  . COVID-19 virus detected 10/22/2019  . Sore throat due to virus 10/22/2019  . Urinary frequency 04/20/2019  . Vaginal odor 04/20/2019  . Chest pain varying with breathing   . Fever and chills   . Vitamin D deficiency 11/15/2018  . Nausea 11/15/2018  . Abnormal pulse oximetry   . Hypokalemia 09/20/2018  . Medication management 09/14/2018  . Muscle spasms of both lower extremities 08/25/2018  . Subjective visual disturbance of right eye 08/25/2018  . 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 Chronic pain syndrome 03/16/2018  . Chronic, continuous use of opioids 03/16/2018  . Tachycardia with heart rate 100-120 beats per minute   . Sickle cell crisis (HCC) 10/19/2017  . Leukocytosis 10/19/2017  . Thrombocytopenia (HCC) 10/19/2017  . Morbidly obese (HCC) 10/19/2017  . Sickle cell pain crisis (HCC) 10/15/2017    Current Status: Since her last office visit, she is doing well with no complaints. She states that she has chronic pain in her left arm and legs . She rates her pain today at 8/10. She has not had a hospital visit for Sickle Cell Crisis since 02/10/2020 where she was treated and discharged 02/15/2020. [facility]. She is currently taking all medications as prescribed and staying well hydrated. She reports occasional nausea, constipation,  dizziness and headaches. She last took pain medications at 2:00 am. She denies fevers, chills, fatigue, recent infections, weight loss, and night sweats. She has not had any visual changes and falls. No chest pain, heart palpitations, cough and shortness of breath reported. Denies GI problems such as nausea, and diarrhea. She has no reports of blood in stools, dysuria and hematuria. No depression or anxiety reported today. She is taking all medications as prescribed.  Past Medical History:  Diagnosis Date  . HSV infection   . Nausea   . Sickle cell anemia (HCC)   . Vitamin B12 deficiency 12/2018  . Vitamin D deficiency     No past surgical history on file.  Family History  Problem Relation Age of Onset  . Hypertension Mother   . Sickle cell trait Mother   . Diabetes Father   . Sickle cell trait Father     Social History   Socioeconomic History  . Marital status: Single    Spouse name: Not on file  . Number of children: Not on file  . Years of education: Not on file  . Highest education level: Not on file  Occupational History  . Not on file  Tobacco Use  . Smoking status: Never Smoker  . Smokeless tobacco: Never Used  Vaping Use  . Vaping Use: Never used  Substance and Sexual Activity  . Alcohol use: Not Currently  . Drug use: Never  . Sexual activity: Yes    Birth control/protection: None  Other Topics Concern  .  Not on file  Social History Narrative  . Not on file   Social Determinants of Health   Financial Resource Strain: Not on file  Food Insecurity: Not on file  Transportation Needs: Not on file  Physical Activity: Not on file  Stress: Not on file  Social Connections: Not on file  Intimate Partner Violence: Not on file    Outpatient Medications Prior to Visit  Medication Sig Dispense Refill  . folic acid (FOLVITE) 1 MG tablet Take 1 tablet (1 mg total) by mouth daily. 30 tablet 11  . ibuprofen (ADVIL) 800 MG tablet TAKE 1 TABLET (800 MG TOTAL) BY  MOUTH EVERY 8 (EIGHT) HOURS AS NEEDED FOR HEADACHE, MILD PAIN, MODERATE PAIN OR CRAMPING. 30 tablet 1  . naloxone (NARCAN) 0.4 MG/ML injection As needed 1 mL 0  . ondansetron (ZOFRAN ODT) 4 MG disintegrating tablet Take 1 tablet (4 mg total) by mouth every 8 (eight) hours as needed for nausea or vomiting. 20 tablet 0  . ondansetron (ZOFRAN) 4 MG tablet Take 4 mg by mouth every 8 (eight) hours as needed for nausea or vomiting.     Marland Kitchen oxyCODONE ER (XTAMPZA ER) 18 MG C12A Take 18 mg by mouth every 12 (twelve) hours. 60 capsule 0  . Oxycodone HCl 20 MG TABS Take 1 tablet (20 mg total) by mouth every 4 (four) hours as needed. 90 tablet 0  . tiZANidine (ZANAFLEX) 4 MG capsule Take 1 capsule (4 mg total) by mouth 3 (three) times daily as needed for muscle spasms. 90 capsule 6  . valACYclovir (VALTREX) 500 MG tablet Take Valtrex 500 mg daily for suppression of outbreaks. Take Valtrex 1000 mg daily X 1 week for outbreaks. (Patient taking differently: Take 500 mg by mouth daily.) 90 tablet 11  . Vitamin D, Ergocalciferol, (DRISDOL) 1.25 MG (50000 UT) CAPS capsule Take 1 capsule (50,000 Units total) by mouth every 7 (seven) days. (Patient taking differently: Take 50,000 Units by mouth every 7 (seven) days. Take on Sunday) 5 capsule 6  . voxelotor (OXBRYTA) 500 MG TABS tablet Take 1,500 mg by mouth daily. (Patient not taking: Reported on 04/19/2020) 90 tablet 11  . ketorolac (TORADOL) 30 MG/ML injection 15 mg     . 0.45 % sodium chloride infusion     . diphenhydrAMINE (BENADRYL) capsule 25 mg     . HYDROmorphone (DILAUDID) 1 mg/mL PCA injection     . naloxone [facility]) injection 0.4 mg     . ondansetron (ZOFRAN) injection 4 mg     . sodium chloride flush (NS) 0.9 % injection 9 mL      No facility-administered medications prior to visit.    No Known Allergies  ROS Review of Systems  Respiratory: Negative.   Cardiovascular: Negative.   Gastrointestinal: Positive for abdominal distention (obese).   Musculoskeletal: Positive for arthralgias (generalized joint pain).  Neurological: Positive for dizziness (occasional) and headaches (occasional).  Psychiatric/Behavioral: Negative.       Objective:    Physical Exam Vitals and nursing note reviewed.  Constitutional:      Appearance: Normal appearance.  Cardiovascular:     Rate and Rhythm: Normal rate and regular rhythm.     Pulses: Normal pulses.     Heart sounds: Normal heart sounds.  Pulmonary:     Effort: Pulmonary effort is normal.     Breath sounds: Normal breath sounds.  Abdominal:     General: Bowel sounds are normal. There is distension (obese).     Palpations: Abdomen  is soft.  Skin:    General: Skin is warm and dry.  Neurological:     Mental Status: She is alert.  Psychiatric:        Mood and Affect: Mood normal.        Behavior: Behavior normal.        Thought Content: Thought content normal.        Judgment: Judgment normal.     There were no vitals taken for this visit. Wt Readings from Last 3 Encounters:  04/26/20 300 lb (136.1 kg)  03/26/20 293 lb (132.9 kg)  03/08/20 299 lb (135.6 kg)     Health Maintenance Due  Topic Date Due  . Hepatitis C Screening  Never done  . COVID-19 Vaccine (1) Never done  . PAP SMEAR-Modifier  Never done    There are no preventive care reminders to display for this patient.  Lab Results  Component Value Date   TSH 1.120 11/15/2018   Lab Results  Component Value Date   WBC 9.5 05/11/2020   HGB 11.7 (L) 05/11/2020   HCT 34.6 (L) 05/11/2020   MCV 68.2 (L) 05/11/2020   PLT 268 05/11/2020   Lab Results  Component Value Date   NA 142 05/11/2020   K 4.1 05/11/2020   CO2 24 05/11/2020   GLUCOSE 100 (H) 05/11/2020   BUN 9 05/11/2020   CREATININE 0.61 05/11/2020   BILITOT 0.8 05/11/2020   ALKPHOS 78 05/11/2020   AST 13 (L) 05/11/2020   ALT 13 05/11/2020   PROT 7.9 05/11/2020   ALBUMIN 4.1 05/11/2020   CALCIUM 9.3 05/11/2020   ANIONGAP 8 05/11/2020   Lab  Results  Component Value Date   CHOL 84 (L) 11/15/2018   Lab Results  Component Value Date   HDL 33 (L) 11/15/2018   Lab Results  Component Value Date   LDLCALC 34 11/15/2018   Lab Results  Component Value Date   TRIG 149 11/07/2019   Lab Results  Component Value Date   CHOLHDL 2.5 11/15/2018   Lab Results  Component Value Date   HGBA1C 4.1 09/11/2017    Assessment & Plan:   1. Hb-SS disease with crisis [facility]) We will transfer Ms. Slider [facility]Fs and possible IV pain medications and anti-emetics to manage her current pain crisis. After discharge she will continue to take pain medications as prescribed; will continue to avoid extreme heat and cold; will continue to eat a healthy diet and drink at least 64 ounces of water daily; continue stool softener as needed; will avoid colds and flu; will continue to get plenty of sleep and rest; will continue to avoid high stressful situations and remain infection free; will continue Folic Acid 1 mg daily to avoid sickle cell crisis. Continue to follow up with Hematologist as needed.   2. Chronic, continuous use of opioids  3. Chronic pain syndrome  4. Anxiety Stable today.   5. Follow up She will follow up in 2 months.   No orders of the defined types were placed in this encounter.   No orders of the defined types were placed in this encounter.   Referral Orders  No referral(s) requested today    No orders of the defined types were placed in this encounter.  Problem List Items Addressed This Visit      Other   Chronic pain syndrome   Chronic, continuous use of opioids    Other Visit Diagnoses  Hb-SS disease with crisis (HCC)    -  Primary   Anxiety       Follow up          No orders of the defined types were placed in this encounter.   Follow-up: No follow-ups on file.    Kallie LocksNatalie M Rayford Williamsen, FNP

## 2020-05-11 NOTE — Telephone Encounter (Signed)
Patient called requesting to come to the day hospital for sickle cell pain. Patient reports right leg and left arm pain rated 8/10. Reports taking Oxycontin at 1:00 am but reports being out of short-acting Oxycodone. Patient can pick up Oxycodone prescription today. COVID-19 screening done and patient denies all symptoms and exposures. Denies fever, chest pain, nausea, vomiting, diarrhea and abdominal pain. Admits to having transportation without driving self. [place], FNP notified. Patient can come to the day hospital for pain management. Patient advised and expresses an understanding.

## 2020-05-11 NOTE — Progress Notes (Signed)
Patient admitted to the day infusion hospital for sickle cell pain. Initially, patient reported left arm and right leg pain rated 8/10. For pain management, patient placed on Dilaudid PCA, given Toradol, Tylenol and hydrated with IV fluids. At discharge, patient rated pain at 4/10. Vital signs stable. AVS offered but patient refused. Patient alert, oriented and ambulatory at discharge.

## 2020-05-11 NOTE — H&P (Addendum)
[facility] History and Physical   Date: 05/11/2020  Patient name: Stephanie Burch Medical record number: [id number] Date of birth: 04/05/1980 Age: 40 y.o. Gender: female PCP: Kallie Locks, FNP  Attending physician: Quentin Angst, MD  Chief Complaint: Sickle cell pain  History of Present Illness: Stephanie Burch is a 40 year old female with a medical history significant for sickle cell disease type [place], chronic pain syndrome, opiate dependence and tolerance, history of anemia of chronic disease, and morbid obesity presents with complaints of right upper extremity and left lower extremity pain over the past several days that has been unrelieved by home medications.  She attributes pain crisis to changes in weather.  Patient last had oxycodone around 2 AM without sustained relief.  Patient rates pain as 8/10 characterized as intermittent and sharp.  No subjective fever, chills, shortness of breath, or headache.  No urinary symptoms, nausea, vomiting, or diarrhea.  No sick contacts, recent travel, or exposure to COVID-19.  Patient denies any recent injury or accident.  Meds: Medications Prior to Admission  Medication Sig Dispense Refill Last Dose  . folic acid (FOLVITE) 1 MG tablet Take 1 tablet (1 mg total) by mouth daily. 30 tablet 11   . ibuprofen (ADVIL) 800 MG tablet TAKE 1 TABLET (800 MG TOTAL) BY MOUTH EVERY 8 (EIGHT) HOURS AS NEEDED FOR HEADACHE, MILD PAIN, MODERATE PAIN OR CRAMPING. 30 tablet 1   . naloxone (NARCAN) 0.4 MG/ML injection As needed 1 mL 0   . ondansetron (ZOFRAN ODT) 4 MG disintegrating tablet Take 1 tablet (4 mg total) by mouth every 8 (eight) hours as needed for nausea or vomiting. 20 tablet 0   . ondansetron (ZOFRAN) 4 MG tablet Take 4 mg by mouth every 8 (eight) hours as needed for nausea or vomiting.      Marland Kitchen oxyCODONE ER (XTAMPZA ER) 18 MG C12A Take 18 mg by mouth every 12 (twelve) hours. 60 capsule 0   . Oxycodone HCl 20 MG TABS Take 1 tablet (20  mg total) by mouth every 4 (four) hours as needed. 90 tablet 0   . tiZANidine (ZANAFLEX) 4 MG capsule Take 1 capsule (4 mg total) by mouth 3 (three) times daily as needed for muscle spasms. 90 capsule 6   . valACYclovir (VALTREX) 500 MG tablet Take Valtrex 500 mg daily for suppression of outbreaks. Take Valtrex 1000 mg daily X 1 week for outbreaks. (Patient taking differently: Take 500 mg by mouth daily.) 90 tablet 11   . Vitamin D, Ergocalciferol, (DRISDOL) 1.25 MG (50000 UT) CAPS capsule Take 1 capsule (50,000 Units total) by mouth every 7 (seven) days. (Patient taking differently: Take 50,000 Units by mouth every 7 (seven) days. Take on Sunday) 5 capsule 6   . voxelotor (OXBRYTA) 500 MG TABS tablet Take 1,500 mg by mouth daily. (Patient not taking: Reported on 04/19/2020) 90 tablet 11     Allergies: Patient has no known allergies. Past Medical History:  Diagnosis Date  . HSV infection   . Nausea   . Sickle cell anemia (HCC)   . Vitamin B12 deficiency 12/2018  . Vitamin D deficiency    No past surgical history on file. Family History  Problem Relation Age of Onset  . Hypertension Mother   . Sickle cell trait Mother   . Diabetes Father   . Sickle cell trait Father    Social History   Socioeconomic History  . Marital status: Single    Spouse name: Not on  file  . Number of children: Not on file  . Years of education: Not on file  . Highest education level: Not on file  Occupational History  . Not on file  Tobacco Use  . Smoking status: Never Smoker  . Smokeless tobacco: Never Used  Vaping Use  . Vaping Use: Never used  Substance and Sexual Activity  . Alcohol use: Not Currently  . Drug use: Never  . Sexual activity: Yes    Birth control/protection: None  Other Topics Concern  . Not on file  Social History Narrative  . Not on file   Social Determinants of Health   Financial Resource Strain: Not on file  Food Insecurity: Not on file  Transportation Needs: Not on file   Physical Activity: Not on file  Stress: Not on file  Social Connections: Not on file  Intimate Partner Violence: Not on file   Review of Systems  Constitutional: Negative for chills and fever.  HENT: Negative.   Eyes: Negative.   Respiratory: Negative.   Cardiovascular: Negative.   Gastrointestinal: Negative.   Genitourinary: Negative.   Musculoskeletal: Positive for back pain and joint pain.  Skin: Negative.   Neurological: Negative.   Psychiatric/Behavioral: Negative.     Physical Exam: There were no vitals taken for this visit. Physical Exam Constitutional:      Appearance: Normal appearance. She is obese.  Eyes:     Pupils: Pupils are equal, round, and reactive to light.  Cardiovascular:     Rate and Rhythm: Normal rate and regular rhythm.  Pulmonary:     Effort: Pulmonary effort is normal.  Abdominal:     General: Bowel sounds are normal.     Palpations: Abdomen is soft.  Skin:    General: Skin is warm.  Neurological:     General: No focal deficit present.     Mental Status: She is alert. Mental status is at baseline.  Psychiatric:        Mood and Affect: Mood normal.        Behavior: Behavior normal.        Thought Content: Thought content normal.        Judgment: Judgment normal.      Lab results: No results found for this or any previous visit (from the past 24 hour(s)).  Imaging results:  No results found.   Assessment & Plan: Patient admitted to sickle cell day infusion center for management of pain crisis.  Patient is opiate tolerant Initiate IV dilaudid PCA. Settings of 0.5 mg, 10 minute lockout, and 3 mg/hr IV fluids, 0.45% saline at 100 mg/hr Toradol 15 mg IV times one dose Tylenol 1000 mg by mouth times one dose Review CBC with differential, complete metabolic panel, and reticulocytes as results become available. Baseline hemoglobin is Pain intensity will be reevaluated in context of functioning and relationship to baseline as care  progresses If pain intensity remains elevated and/or sudden change in hemodynamic stability transition to inpatient services for higher level of care.    Nolon Nations  APRN, MSN, FNP-C Patient Care [facility] [address] [place], [place] [address] [phone]   05/11/2020, 10:10 AM

## 2020-05-12 NOTE — Discharge Summary (Signed)
[facility] Discharge Summary   Patient ID: Stephanie Burch MRN: [id number] DOB/AGE: [birth year] 40 y.o.  Admit date: 05/11/2020 Discharge date: 05/11/2020  Primary Care Physician:  Kallie Locks, FNP  Admission Diagnoses:  Active Problems:   Sickle cell pain crisis [facility])   Discharge Medications:  Allergies as of 05/11/2020   No Known Allergies     Medication List    ASK your doctor about these medications   folic acid 1 MG tablet Commonly known as: FOLVITE Take 1 tablet (1 mg total) by mouth daily.   ibuprofen 800 MG tablet Commonly known as: ADVIL TAKE 1 TABLET (800 MG TOTAL) BY MOUTH EVERY 8 (EIGHT) HOURS AS NEEDED FOR HEADACHE, MILD PAIN, MODERATE PAIN OR CRAMPING.   naloxone 0.4 MG/ML injection Commonly known as: NARCAN As needed   ondansetron 4 MG disintegrating tablet Commonly known as: Zofran ODT Take 1 tablet (4 mg total) by mouth every 8 (eight) hours as needed for nausea or vomiting.   ondansetron 4 MG tablet Commonly known as: ZOFRAN Take 4 mg by mouth every 8 (eight) hours as needed for nausea or vomiting.   Oxbryta 500 MG Tabs tablet Generic drug: voxelotor Take 1,500 mg by mouth daily.   Oxycodone HCl 20 MG Tabs Take 1 tablet (20 mg total) by mouth every 4 (four) hours as needed.   tiZANidine 4 MG capsule Commonly known as: ZANAFLEX Take 1 capsule (4 mg total) by mouth 3 (three) times daily as needed for muscle spasms.   valACYclovir 500 MG tablet Commonly known as: Valtrex Take Valtrex 500 mg daily for suppression of outbreaks. Take Valtrex 1000 mg daily X 1 week for outbreaks.   Vitamin D (Ergocalciferol) 1.25 MG (50000 UNIT) Caps capsule Commonly known as: DRISDOL Take 1 capsule (50,000 Units total) by mouth every 7 (seven) days.   Xtampza ER 18 MG C12a Generic drug: oxyCODONE ER Take 18 mg by mouth every 12 (twelve) hours.        Consults:  None  Significant Diagnostic Studies:  No results found.   History of  present illness: Stephanie Burch is a 40 year old female with a medical history significant for sickle cell disease type [place], chronic pain syndrome, opiate dependence and tolerance, history of anemia of chronic disease, and morbid obesity presents with complaints of right upper extremity and left lower extremity pain over the past several days that has been unrelieved by home medications.  She attributes pain crisis to changes in weather.  Patient last had oxycodone around 2 AM without sustained relief.  Patient rates pain as 8/10 characterized as intermittent and sharp.  No subjective fever, chills, shortness of breath, or headache.  No urinary symptoms, nausea, vomiting, or diarrhea.  No sick contacts, recent travel, or exposure to COVID-19.  Patient denies any recent injury or accident. [facility] Course: Patient admitted to sickle cell day infusion clinic for management of pain crisis Reviewed all laboratory values, mostly consistent with patient's baseline. Pain managed with IV Dilaudid via PCA Toradol 15 mg IV x1 Tylenol 1000 mg x 1 IV fluids, 0.45% saline at 100 mL/h Pain intensity improved throughout admission.  Patient rates pain as 4/10 and does not warrant inpatient admission at this time.  She is alert, oriented, and ambulating without assistance.  Patient will discharge home in a hemodynamically stable condition.  Discharge instructions: Resume all home medications.   Follow up with PCP as previously  scheduled.   Discussed the importance of drinking  64 ounces of water daily, dehydration of red blood cells may lead further sickling.   Avoid all stressors that precipitate sickle cell pain crisis.     The patient was given clear instructions to go to ER or return to medical center if symptoms do not improve, worsen or new problems develop.      Physical Exam at Discharge:  BP 139/72 (BP Location: Left Arm)   Pulse 95   Temp 97.9 F (36.6 C) (Temporal)   Resp 14    LMP 04/01/2020   SpO2 94%   Physical Exam Constitutional:      Appearance: She is obese.  HENT:     Mouth/Throat:     Mouth: Mucous membranes are moist.  Cardiovascular:     Rate and Rhythm: Normal rate and regular rhythm.     Pulses: Normal pulses.  Pulmonary:     Effort: Pulmonary effort is normal.  Abdominal:     General: Bowel sounds are normal.     Palpations: Abdomen is soft.  Skin:    General: Skin is warm.  Neurological:     General: No focal deficit present.     Mental Status: She is alert. Mental status is at baseline.  Psychiatric:        Mood and Affect: Mood normal.        Thought Content: Thought content normal.        Judgment: Judgment normal.      Disposition at Discharge: Discharge disposition: 01-Home or Self Care       Discharge Orders:   Condition at Discharge:   Stable  Time spent on Discharge:  Greater than 30 minutes.  Signed: Nolon Nations  APRN, MSN, FNP-C Patient Care [facility] [address] [place], [place] [address] [phone]  05/12/2020, 10:23 AM

## 2020-05-17 NOTE — Telephone Encounter (Signed)
Error

## 2020-05-18 ENCOUNTER — Other Ambulatory Visit: Payer: Self-pay

## 2020-05-18 ENCOUNTER — Emergency Department (HOSPITAL_COMMUNITY): Payer: Medicare Other

## 2020-05-18 ENCOUNTER — Encounter (HOSPITAL_COMMUNITY): Payer: Self-pay

## 2020-05-18 ENCOUNTER — Inpatient Hospital Stay (HOSPITAL_COMMUNITY)
Admission: EM | Admit: 2020-05-18 | Discharge: 2020-05-22 | DRG: 812 | Disposition: A | Discharge status: Home / Self Care | Payer: Medicare Other | Attending: Internal Medicine | Admitting: Internal Medicine

## 2020-05-18 DIAGNOSIS — D5700 Hb-SS disease with crisis, unspecified: Secondary | ICD-10-CM | POA: Diagnosis not present

## 2020-05-18 DIAGNOSIS — Z6841 Body Mass Index (BMI) 40.0 and over, adult: Secondary | ICD-10-CM

## 2020-05-18 DIAGNOSIS — F411 Generalized anxiety disorder: Secondary | ICD-10-CM | POA: Diagnosis present

## 2020-05-18 DIAGNOSIS — F1190 Opioid use, unspecified, uncomplicated: Secondary | ICD-10-CM

## 2020-05-18 DIAGNOSIS — G894 Chronic pain syndrome: Secondary | ICD-10-CM | POA: Diagnosis present

## 2020-05-18 DIAGNOSIS — R110 Nausea: Secondary | ICD-10-CM | POA: Diagnosis present

## 2020-05-18 DIAGNOSIS — D72829 Elevated white blood cell count, unspecified: Secondary | ICD-10-CM | POA: Diagnosis present

## 2020-05-18 DIAGNOSIS — Z20822 Contact with and (suspected) exposure to covid-19: Secondary | ICD-10-CM | POA: Diagnosis present

## 2020-05-18 DIAGNOSIS — Z833 Family history of diabetes mellitus: Secondary | ICD-10-CM

## 2020-05-18 DIAGNOSIS — Z8616 Personal history of COVID-19: Secondary | ICD-10-CM

## 2020-05-18 DIAGNOSIS — D571 Sickle-cell disease without crisis: Secondary | ICD-10-CM

## 2020-05-18 DIAGNOSIS — M62838 Other muscle spasm: Secondary | ICD-10-CM | POA: Diagnosis present

## 2020-05-18 DIAGNOSIS — Z8249 Family history of ischemic heart disease and other diseases of the circulatory system: Secondary | ICD-10-CM

## 2020-05-18 DIAGNOSIS — F1120 Opioid dependence, uncomplicated: Secondary | ICD-10-CM | POA: Diagnosis present

## 2020-05-18 DIAGNOSIS — Z79899 Other long term (current) drug therapy: Secondary | ICD-10-CM

## 2020-05-18 DIAGNOSIS — Z832 Family history of diseases of the blood and blood-forming organs and certain disorders involving the immune mechanism: Secondary | ICD-10-CM

## 2020-05-18 LAB — COMPREHENSIVE METABOLIC PANEL
ALT: 14 U/L (ref 0–44)
AST: 26 U/L (ref 15–41)
Albumin: 4 g/dL (ref 3.5–5.0)
Alkaline Phosphatase: 75 U/L (ref 38–126)
Anion gap: 7 (ref 5–15)
BUN: 7 mg/dL (ref 6–20)
CO2: 27 mmol/L (ref 22–32)
Calcium: 9.3 mg/dL (ref 8.9–10.3)
Chloride: 109 mmol/L (ref 98–111)
Creatinine, Ser: 0.74 mg/dL (ref 0.44–1.00)
GFR, Estimated: >60 mL/min (ref >60)
Glucose, Bld: 98 mg/dL (ref 70–99)
Potassium: 4.9 mmol/L (ref 3.5–5.1)
Sodium: 143 mmol/L (ref 135–145)
Total Bilirubin: 0.9 mg/dL (ref 0.3–1.2)
Total Protein: 7.3 g/dL (ref 6.5–8.1)

## 2020-05-18 LAB — CBC WITH DIFFERENTIAL/PLATELET
Abs Immature Granulocytes: 0.03 10*3/uL (ref 0.00–0.07)
Basophils Absolute: 0 10*3/uL (ref 0.0–0.1)
Basophils Relative: 0 %
Eosinophils Absolute: 0.2 10*3/uL (ref 0.0–0.5)
Eosinophils Relative: 2 %
HCT: 34.5 % — ABNORMAL LOW (ref 36.0–46.0)
Hemoglobin: 11.6 g/dL — ABNORMAL LOW (ref 12.0–15.0)
Immature Granulocytes: 0 %
Lymphocytes Relative: 22 %
Lymphs Abs: 2 10*3/uL (ref 0.7–4.0)
MCH: 23.5 pg — ABNORMAL LOW (ref 26.0–34.0)
MCHC: 33.6 g/dL (ref 30.0–36.0)
MCV: 70 fL — ABNORMAL LOW (ref 80.0–100.0)
Monocytes Absolute: 0.6 10*3/uL (ref 0.1–1.0)
Monocytes Relative: 7 %
Neutro Abs: 6.4 10*3/uL (ref 1.7–7.7)
Neutrophils Relative %: 69 %
Platelets: 240 10*3/uL (ref 150–400)
RBC: 4.93 MIL/uL (ref 3.87–5.11)
RDW: 15.9 % — ABNORMAL HIGH (ref 11.5–15.5)
WBC: 9.3 10*3/uL (ref 4.0–10.5)
nRBC: 0.8 % — ABNORMAL HIGH (ref 0.0–0.2)

## 2020-05-18 LAB — RETICULOCYTES
Immature Retic Fract: 38 % — ABNORMAL HIGH (ref 2.3–15.9)
RBC.: 4.95 MIL/uL (ref 3.87–5.11)
Retic Count, Absolute: 234.1 10*3/uL — ABNORMAL HIGH (ref 19.0–186.0)
Retic Ct Pct: 4.7 % — ABNORMAL HIGH (ref 0.4–3.1)

## 2020-05-18 LAB — RESP PANEL BY RT-PCR (FLU A&B, COVID) ARPGX2
Influenza A by PCR: NEGATIVE
Influenza B by PCR: NEGATIVE
SARS Coronavirus 2 by RT PCR: NEGATIVE

## 2020-05-18 LAB — I-STAT BETA HCG BLOOD, ED (MC, WL, AP ONLY): I-stat hCG, quantitative: <5 m[IU]/mL (ref <5)

## 2020-05-18 MED ORDER — DIPHENHYDRAMINE HCL 50 MG/ML IJ SOLN
25.0000 mg | Freq: Once | INTRAMUSCULAR | Status: AC
  Administered 2020-05-18: 25 mg via INTRAVENOUS
  Filled 2020-05-18: qty 1

## 2020-05-18 MED ORDER — HYDROMORPHONE 1 MG/ML IV SOLN
INTRAVENOUS | Status: DC
  Administered 2020-05-18: 30 mg via INTRAVENOUS
  Administered 2020-05-19: 2.5 mg via INTRAVENOUS
  Administered 2020-05-19: 9.5 mg via INTRAVENOUS
  Administered 2020-05-19: 11 mg via INTRAVENOUS
  Filled 2020-05-18: qty 30

## 2020-05-18 MED ORDER — ENOXAPARIN SODIUM 40 MG/0.4ML ~~LOC~~ SOLN
40.0000 mg | SUBCUTANEOUS | Status: DC
  Administered 2020-05-18 – 2020-05-21 (×4): 40 mg via SUBCUTANEOUS
  Filled 2020-05-18 (×4): qty 0.4

## 2020-05-18 MED ORDER — POLYETHYLENE GLYCOL 3350 17 G PO PACK: 17.0000 g | PACK | Freq: Every day | ORAL | Status: DC | PRN

## 2020-05-18 MED ORDER — HYDROMORPHONE HCL 2 MG/ML IJ SOLN
2.0000 mg | INTRAMUSCULAR | Status: AC
  Administered 2020-05-18: 2 mg via INTRAVENOUS
  Filled 2020-05-18 (×2): qty 1

## 2020-05-18 MED ORDER — FOLIC ACID 1 MG PO TABS
1.0000 mg | ORAL_TABLET | Freq: Every day | ORAL | Status: DC
  Administered 2020-05-18 – 2020-05-22 (×5): 1 mg via ORAL
  Filled 2020-05-18 (×6): qty 1

## 2020-05-18 MED ORDER — NALOXONE HCL 0.4 MG/ML IJ SOLN: 0.4000 mg | INTRAMUSCULAR | Status: DC | PRN

## 2020-05-18 MED ORDER — DIPHENHYDRAMINE HCL 25 MG PO CAPS: 25.0000 mg | ORAL_CAPSULE | ORAL | Status: DC | PRN

## 2020-05-18 MED ORDER — KETOROLAC TROMETHAMINE 15 MG/ML IJ SOLN
15.0000 mg | INTRAMUSCULAR | Status: AC
  Administered 2020-05-18: 15 mg via INTRAVENOUS
  Filled 2020-05-18: qty 1

## 2020-05-18 MED ORDER — OXYCODONE HCL ER 10 MG PO T12A
20.0000 mg | EXTENDED_RELEASE_TABLET | Freq: Two times a day (BID) | ORAL | Status: DC
  Administered 2020-05-18 – 2020-05-22 (×8): 20 mg via ORAL
  Filled 2020-05-18 (×8): qty 2

## 2020-05-18 MED ORDER — SENNOSIDES-DOCUSATE SODIUM 8.6-50 MG PO TABS
1.0000 | ORAL_TABLET | Freq: Two times a day (BID) | ORAL | Status: DC
  Administered 2020-05-18 – 2020-05-22 (×7): 1 via ORAL
  Filled 2020-05-18 (×8): qty 1

## 2020-05-18 MED ORDER — ONDANSETRON HCL 4 MG/2ML IJ SOLN: 4.0000 mg | Freq: Four times a day (QID) | INTRAMUSCULAR | Status: DC | PRN

## 2020-05-18 MED ORDER — HYDROMORPHONE HCL 2 MG/ML IJ SOLN
2.0000 mg | Freq: Once | INTRAMUSCULAR | Status: AC
  Administered 2020-05-18: 2 mg via INTRAVENOUS
  Filled 2020-05-18: qty 1

## 2020-05-18 MED ORDER — VALACYCLOVIR HCL 500 MG PO TABS
500.0000 mg | ORAL_TABLET | Freq: Every day | ORAL | Status: DC
  Administered 2020-05-18 – 2020-05-22 (×5): 500 mg via ORAL
  Filled 2020-05-18 (×7): qty 1

## 2020-05-18 MED ORDER — OXYCODONE HCL 5 MG PO TABS
20.0000 mg | ORAL_TABLET | ORAL | Status: DC | PRN
  Administered 2020-05-19 – 2020-05-22 (×15): 20 mg via ORAL
  Filled 2020-05-18 (×15): qty 4

## 2020-05-18 MED ORDER — SODIUM CHLORIDE 0.9% FLUSH: 9.0000 mL | INTRAVENOUS | Status: DC | PRN

## 2020-05-18 MED ORDER — KETOROLAC TROMETHAMINE 15 MG/ML IJ SOLN
15.0000 mg | Freq: Four times a day (QID) | INTRAMUSCULAR | Status: DC
  Administered 2020-05-18 – 2020-05-22 (×16): 15 mg via INTRAVENOUS
  Filled 2020-05-18 (×16): qty 1

## 2020-05-18 MED ORDER — HYDROMORPHONE HCL 2 MG/ML IJ SOLN
2.0000 mg | INTRAMUSCULAR | Status: AC
Start: 2020-05-18 — End: 2020-05-18
  Administered 2020-05-18: 2 mg via INTRAVENOUS
  Filled 2020-05-18: qty 1

## 2020-05-18 MED ORDER — HYDROMORPHONE HCL 2 MG/ML IJ SOLN
2.0000 mg | INTRAMUSCULAR | Status: AC
  Administered 2020-05-18: 2 mg via INTRAVENOUS

## 2020-05-18 NOTE — H&P (Addendum)
H&P  Patient Demographics:  Stephanie Burch, is a 40 y.o. female  MRN: [id number]   DOB - [birth year]  Admit Date - 05/18/2020  Outpatient Primary MD for the patient is Kallie Locks, FNP  Chief Complaint  Patient presents with  . Sickle Cell Pain Crisis      HPI:   Stephanie Burch  is a 40 y.o. female is a 40 year old female with a medical history significant for sickle cell disease, chronic pain syndrome, opiate dependence and tolerance, history of anxiety, and morbid obesity that presents complaining of lower extremity pain that is consistent with previous sickle cell pain crisis.  Says that she was in usual state of health until several days ago.  She took her kids on vacation to care wounds.  At that time, she did a lot of walking.  The following day, she experienced extreme pain to lower extremities.  She rates her pain as 8/10.  Pain is been unrelieved by home medications.  She denies any headache, chest pain, shortness of breath, urinary symptoms, nausea, vomiting, or diarrhea.  No sick contacts, recent travel, or exposure to COVID-19   ER Course:  Vital signs are recorded as: BP (!) 143/90 (BP Location: Left Wrist)   Pulse 85   Temp 98.2 F (36.8 C) (Oral)   Resp 16   Ht 5\' 5"  (1.651 m)   Wt 127 kg   SpO2 100%   BMI 46.59 kg/m .  All laboratory values are unremarkable.  Patient's pain persists despite IV Dilaudid, IV fluids, and IV Toradol.  Sickle cell services notified for admission.  Patient admitted to MedSurg and observation for management of pain crisis.    Review of systems:  Review of Systems  Constitutional: Negative for chills and fever.  HENT: Negative.   Respiratory: Negative.   Cardiovascular: Negative.   Gastrointestinal: Negative.   Genitourinary: Negative.   Musculoskeletal: Positive for back pain and joint pain.  Skin: Negative.   Neurological: Negative.   Psychiatric/Behavioral: Negative.     With Past History of the following :   Past Medical  History:  Diagnosis Date  . HSV infection   . Nausea   . Sickle cell anemia (HCC)   . Vitamin B12 deficiency 12/2018  . Vitamin D deficiency       History reviewed. No pertinent surgical history.   Social History:   Social History   Tobacco Use  . Smoking status: Never Smoker  . Smokeless tobacco: Never Used  Substance Use Topics  . Alcohol use: Not Currently     Lives - At home   Family History :   Family History  Problem Relation Age of Onset  . Hypertension Mother   . Sickle cell trait Mother   . Diabetes Father   . Sickle cell trait Father      Home Medications:   Prior to Admission medications   Medication Sig Start Date End Date Taking? Authorizing Provider  folic acid (FOLVITE) 1 MG tablet Take 1 tablet (1 mg total) by mouth daily. 11/15/18  Yes 01/15/19, FNP  ibuprofen (ADVIL) 800 MG tablet TAKE 1 TABLET (800 MG TOTAL) BY MOUTH EVERY 8 (EIGHT) HOURS AS NEEDED FOR HEADACHE, MILD PAIN, MODERATE PAIN OR CRAMPING. 04/10/20  Yes 06/10/20, FNP  ondansetron (ZOFRAN) 4 MG tablet Take 4 mg by mouth every 8 (eight) hours as needed for nausea or vomiting.  11/25/19  Yes [provider]  oxyCODONE ER (XTAMPZA ER) 18 MG C12A  Take 18 mg by mouth every 12 (twelve) hours. 04/23/20  Yes Kallie Locks, FNP  Oxycodone HCl 20 MG TABS Take 1 tablet (20 mg total) by mouth every 4 (four) hours as needed. Patient taking differently: Take 20 mg by mouth every 4 (four) hours as needed (pain). 05/11/20  Yes Kallie Locks, FNP  tiZANidine (ZANAFLEX) 4 MG capsule Take 1 capsule (4 mg total) by mouth 3 (three) times daily as needed for muscle spasms. 05/08/20 12/04/20 Yes Kallie Locks, FNP  valACYclovir (VALTREX) 500 MG tablet Take Valtrex 500 mg daily for suppression of outbreaks. Take Valtrex 1000 mg daily X 1 week for outbreaks. Patient taking differently: Take 500 mg by mouth daily. 10/20/19  Yes Kallie Locks, FNP  Vitamin D, Ergocalciferol, (DRISDOL)  1.25 MG (50000 UT) CAPS capsule Take 1 capsule (50,000 Units total) by mouth every 7 (seven) days. Patient taking differently: Take 50,000 Units by mouth every 7 (seven) days. Take on Sunday 12/21/18  Yes Kallie Locks, FNP  voxelotor (OXBRYTA) 500 MG TABS tablet Take 1,500 mg by mouth daily. Patient not taking: No sig reported 01/11/20   Kallie Locks, FNP     Allergies:   No Known Allergies   Physical Exam:   Vitals:   Vitals:   05/18/20 1545 05/18/20 1600  BP:  (!) 215/146  Pulse: 88 91  Resp: 14 18  Temp:    SpO2: 98% 99%    Physical Exam: Constitutional: Patient appears well-developed and well-nourished. Not in obvious distress. Morbid  HENT: Normocephalic, atraumatic, External right and left ear normal. Oropharynx is clear and moist.  Eyes: Conjunctivae and EOM are normal. PERRLA, no scleral icterus. Neck: Normal ROM. Neck supple. No JVD. No tracheal deviation. No thyromegaly. CVS: RRR, S1/S2 +, no murmurs, no gallops, no carotid bruit.  Pulmonary: Effort and breath sounds normal, no stridor, rhonchi, wheezes, rales.  Abdominal: Soft. BS +, no distension, tenderness, rebound or guarding.  Musculoskeletal: Normal range of motion. No edema and no tenderness.  Lymphadenopathy: No lymphadenopathy noted, cervical, inguinal or axillary Neuro: Alert. Normal reflexes, muscle tone coordination. No cranial nerve deficit. Skin: Skin is warm and dry. No rash noted. Not diaphoretic. No erythema. No pallor. Psychiatric: Normal mood and affect. Behavior, judgment, thought content normal.   Data Review:   CBC Recent Labs  Lab 05/18/20 1300  WBC 9.3  HGB 11.6*  HCT 34.5*  PLT 240  MCV 70.0*  MCH 23.5*  MCHC 33.6  RDW 15.9*  LYMPHSABS 2.0  MONOABS 0.6  EOSABS 0.2  BASOSABS 0.0   ------------------------------------------------------------------------------------------------------------------  Chemistries  Recent Labs  Lab 05/18/20 1300  NA 143  K 4.9  CL 109   CO2 27  GLUCOSE 98  BUN 7  CREATININE 0.74  CALCIUM 9.3  AST 26  ALT 14  ALKPHOS 75  BILITOT 0.9   ------------------------------------------------------------------------------------------------------------------ estimated creatinine clearance is 129.2 mL/min (by C-G formula based on SCr of 0.74 mg/dL). ------------------------------------------------------------------------------------------------------------------ No results for input(s): TSH, T4TOTAL, T3FREE, THYROIDAB in the last 72 hours.  Invalid input(s): FREET3  Coagulation profile No results for input(s): INR, PROTIME in the last 168 hours. ------------------------------------------------------------------------------------------------------------------- No results for input(s): DDIMER in the last 72 hours. -------------------------------------------------------------------------------------------------------------------  Cardiac Enzymes No results for input(s): CKMB, TROPONINI, MYOGLOBIN in the last 168 hours.  Invalid input(s): CK ------------------------------------------------------------------------------------------------------------------ No results found for: BNP  ---------------------------------------------------------------------------------------------------------------  Urinalysis    Component Value Date/Time   COLORURINE YELLOW 11/28/2019 1443   APPEARANCEUR CLEAR 11/28/2019 1443  LABSPEC 1.017 11/28/2019 1443   PHURINE 7.0 11/28/2019 1443   GLUCOSEU NEGATIVE 11/28/2019 1443   HGBUR NEGATIVE 11/28/2019 1443   BILIRUBINUR NEGATIVE 11/28/2019 1443   BILIRUBINUR Negative 04/27/2019 0935   KETONESUR NEGATIVE 11/28/2019 1443   PROTEINUR NEGATIVE 11/28/2019 1443   UROBILINOGEN 1.0 04/27/2019 0935   NITRITE NEGATIVE 11/28/2019 1443   LEUKOCYTESUR NEGATIVE 11/28/2019 1443    ----------------------------------------------------------------------------------------------------------------   Imaging  Results:    DG Chest Port 1 View  Result Date: 05/18/2020 CLINICAL DATA:  Sickle cell disease, pain in both legs and LEFT arm for more than a week EXAM: PORTABLE CHEST 1 VIEW COMPARISON:  Portable exam 1224 hours compared to 11/28/2019 FINDINGS: Borderline enlargement of cardiac silhouette. Mediastinal contours and pulmonary vascularity normal. Minimal bibasilar atelectasis. Lungs otherwise clear. No acute infiltrate, pleural effusion or pneumothorax. IMPRESSION: Minimal bibasilar atelectasis. Electronically Signed   By: Ulyses Southward M.D.   On: 05/18/2020 13:01     Assessment & Plan:  Principal Problem:   Sickle cell pain crisis (HCC) Active Problems:   Morbid obesity with BMI of 45.0-49.9, adult (HCC)   Chronic pain syndrome  Sickle cell disease with pain crisis: Admit to MedSurg.  Continue IV fluids, 0.45% saline at 100 mL/h. Initiate IV Dilaudid PCA with settings of 0.5 mg, 10-minute lockout, and 3 mg/h with a 1 mg loading dose. IV Toradol 15 mg every 6 hours Initiate home medications Monitor vital signs very closely, reevaluate pain scale regularly, supplemental oxygen as needed. Patient will be reevaluated for pain in the context of function and relationship to baseline as her care progresses.  Sickle cell anemia: Hemoglobin is 11.6, which is consistent with patient's baseline.  There is no clinical indication for blood transfusion at this time.  Continue folic acid.  Patient has not been taking Oxbryta over the past several weeks.  Chronic pain syndrome: Continue home medications  Morbid obesity: Heart healthy diet.  Patient counseled extensively on the importance of following a low-fat, low carbohydrate diet and reducing excess calories.  History of anxiety: Continue home medications.  Patient denies any suicidal homicidal ideations at this time.  Continue to follow closely.   DVT Prophylaxis: Subcut Lovenox   AM Labs Ordered, also please review Full Orders  Family  Communication: Admission, patient's condition and plan of care including tests being ordered have been discussed with the patient who indicate understanding and agree with the plan and Code Status.  Code Status: Full Code  Consults called: None    Admission status: Inpatient    Time spent in minutes : 35 minutes  Nolon Nations  APRN, MSN, FNP-C Patient Care [facility] [address] [place], [place] [address] [phone]  05/18/2020 at 5:10 PM

## 2020-05-18 NOTE — ED Triage Notes (Signed)
Patient c/o sickle cell pain bilateral legs and left arm for >a week.

## 2020-05-18 NOTE — ED Notes (Signed)
2 IV attempts, unsuccessful

## 2020-05-18 NOTE — ED Notes (Signed)
Spoke with IV team, was told they are aware of order, will be here when they can

## 2020-05-18 NOTE — ED Notes (Signed)
IV team to bedside at this time 

## 2020-05-18 NOTE — ED Provider Notes (Addendum)
[facility]-EMERGENCY DEPT Provider Note   CSN: [id number] Arrival date & time: 05/18/20  1135     History Chief Complaint  Patient presents with  . Sickle Cell Pain Crisis    Stephanie Burch is a 40 y.o. female.  HPI 40 year old female with a history of sickle cell anemia presents to the ER with complaints of sickle cell crisis.  She complains of pain in the bilateral legs and left arm for more than a week.  She has been taking her home oxycodone with little relief.  She states this is typical for her crises.  She denies any chest pain or shortness of breath.  No fevers or chills.    Past Medical History:  Diagnosis Date  . HSV infection   . Nausea   . Sickle cell anemia (HCC)   . Vitamin B12 deficiency 12/2018  . Vitamin D deficiency     Patient Active Problem List   Diagnosis Date Noted  . Pulmonary edema 11/11/2019  . Hypoxia   . Poor venous access 11/09/2019  . HCAP (healthcare-associated pneumonia) 11/07/2019  . Sepsis (HCC) 11/07/2019  . Acute kidney injury (HCC) 11/07/2019  . History of COVID-19 11/07/2019  . COVID-19 virus detected 10/22/2019  . Sore throat due to virus 10/22/2019  . Urinary frequency 04/20/2019  . Vaginal odor 04/20/2019  . Chest pain varying with breathing   . Fever and chills   . Vitamin D deficiency 11/15/2018  . Nausea 11/15/2018  . Abnormal pulse oximetry   . Hypokalemia 09/20/2018  . Medication management 09/14/2018  . Muscle spasms of both lower extremities 08/25/2018  . Subjective visual disturbance of right eye 08/25/2018  . 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 Chronic pain syndrome 03/16/2018  . Chronic, continuous use of opioids 03/16/2018  . Tachycardia with heart rate 100-120 beats per minute   . Sickle cell crisis (HCC) 10/19/2017  . Leukocytosis 10/19/2017  . Thrombocytopenia (HCC) 10/19/2017  . Morbid obesity with BMI of 45.0-49.9, adult (HCC) 10/19/2017  . Sickle cell pain crisis (HCC)  10/15/2017    History reviewed. No pertinent surgical history.   OB History    Gravida  3   Para      Term      Preterm      AB      Living  3     SAB      IAB      Ectopic      Multiple      Live Births              Family History  Problem Relation Age of Onset  . Hypertension Mother   . Sickle cell trait Mother   . Diabetes Father   . Sickle cell trait Father     Social History   Tobacco Use  . Smoking status: Never Smoker  . Smokeless tobacco: Never Used  Vaping Use  . Vaping Use: Never used  Substance Use Topics  . Alcohol use: Not Currently  . Drug use: Never    Home Medications Prior to Admission medications   Medication Sig Start Date End Date Taking? Authorizing Provider  folic acid (FOLVITE) 1 MG tablet Take 1 tablet (1 mg total) by mouth daily. 11/15/18  Yes Kallie LocksStroud, [name], FNP  ibuprofen (ADVIL) 800 MG tablet TAKE 1 TABLET (800 MG TOTAL) BY MOUTH EVERY 8 (EIGHT) HOURS AS NEEDED FOR HEADACHE, MILD PAIN, MODERATE PAIN OR CRAMPING. 04/10/20  Yes Raliegh IpStroud, Natalie  M, FNP  ondansetron (ZOFRAN) 4 MG tablet Take 4 mg by mouth every 8 (eight) hours as needed for nausea or vomiting.  11/25/19  Yes [provider]  tiZANidine (ZANAFLEX) 4 MG capsule Take 1 capsule (4 mg total) by mouth 3 (three) times daily as needed for muscle spasms. 05/08/20 12/04/20 Yes Kallie Locks, FNP  valACYclovir (VALTREX) 500 MG tablet Take Valtrex 500 mg daily for suppression of outbreaks. Take Valtrex 1000 mg daily X 1 week for outbreaks. Patient taking differently: Take 500 mg by mouth daily. 10/20/19  Yes Kallie Locks, FNP  Vitamin D, Ergocalciferol, (DRISDOL) 1.25 MG (50000 UT) CAPS capsule Take 1 capsule (50,000 Units total) by mouth every 7 (seven) days. Patient taking differently: Take 50,000 Units by mouth every 7 (seven) days. Take on Sunday 12/21/18  Yes Kallie Locks, FNP  oxyCODONE ER [facility] ER) 18 MG C12A Take 18 mg by mouth every 12  (twelve) hours. 05/22/20   Rometta Emery, MD  Oxycodone HCl 20 MG TABS Take 1 tablet (20 mg total) by mouth every 4 (four) hours as needed (pain). 06/02/20   Massie Maroon, FNP  voxelotor (OXBRYTA) 500 MG TABS tablet Take 1,500 mg by mouth daily. Patient not taking: No sig reported 01/11/20   Kallie Locks, FNP    Allergies    Patient has no known allergies.  Review of Systems   Review of Systems  Constitutional: Negative for chills and fever.  HENT: Negative for ear pain and sore throat.   Eyes: Negative for pain and visual disturbance.  Respiratory: Negative for cough and shortness of breath.   Cardiovascular: Negative for chest pain and palpitations.  Gastrointestinal: Negative for abdominal pain and vomiting.  Genitourinary: Negative for dysuria and hematuria.  Musculoskeletal: Positive for arthralgias. Negative for back pain.  Skin: Negative for color change and rash.  Neurological: Negative for seizures and syncope.  All other systems reviewed and are negative.   Physical Exam Updated Vital Signs BP 125/75 (BP Location: Right Arm)   Pulse 84   Temp 97.7 F (36.5 C) (Oral)   Resp 13   Ht 5\' 5"  (1.651 m)   Wt 127 kg   SpO2 100%   BMI 46.59 kg/m   Physical Exam Vitals and nursing note reviewed.  Constitutional:      General: She is not in acute distress.    Appearance: She is well-developed.  HENT:     Head: Normocephalic and atraumatic.  Eyes:     Conjunctiva/sclera: Conjunctivae normal.  Cardiovascular:     Rate and Rhythm: Normal rate and regular rhythm.     Pulses: Normal pulses.     Heart sounds: Normal heart sounds. No murmur heard.   Pulmonary:     Effort: Pulmonary effort is normal. No respiratory distress.     Breath sounds: Normal breath sounds.  Abdominal:     General: Abdomen is flat.     Palpations: Abdomen is soft.     Tenderness: There is no abdominal tenderness.  Musculoskeletal:        General: Normal range of motion.      Cervical back: Neck supple.     Right lower leg: No edema.     Left lower leg: No edema.  Skin:    General: Skin is warm and dry.  Neurological:     General: No focal deficit present.     Mental Status: She is alert and oriented to person, place, and time.  ED Results / Procedures / Treatments   Labs (all labs ordered are listed, but only abnormal results are displayed) Labs Reviewed  CBC WITH DIFFERENTIAL/PLATELET - Abnormal; Notable for the following components:      Result Value   Hemoglobin 11.6 (*)    HCT 34.5 (*)    MCV 70.0 (*)    MCH 23.5 (*)    RDW 15.9 (*)    nRBC 0.8 (*)    All other components within normal limits  RETICULOCYTES - Abnormal; Notable for the following components:   Retic Ct Pct 4.7 (*)    Retic Count, Absolute 234.1 (*)    Immature Retic Fract 38.0 (*)    All other components within normal limits  CBC - Abnormal; Notable for the following components:   WBC 10.6 (*)    Hemoglobin 11.1 (*)    HCT 33.2 (*)    MCV 71.1 (*)    MCH 23.8 (*)    RDW 16.1 (*)    nRBC 0.5 (*)    All other components within normal limits  RESP PANEL BY RT-PCR (FLU A&B, COVID) ARPGX2  COMPREHENSIVE METABOLIC PANEL  HIV ANTIBODY (ROUTINE TESTING W REFLEX)  I-STAT BETA HCG BLOOD, ED ([facility], [facility], [facility] ONLY)    EKG EKG Interpretation  Date/Time:  Friday May 18 2020 12:37:53 EDT Ventricular Rate:  88 PR Interval:  150 QRS Duration: 96 QT Interval:  388 QTC Calculation: 470 R Axis:   93 Text Interpretation: Sinus rhythm Borderline right axis deviation Borderline T wave abnormalities No significant change since last tracing Confirmed by Vanetta Mulders [phone]) on 05/18/2020 12:41:24 PM   Radiology No results found.  Procedures Procedures   Medications Ordered in ED Medications  HYDROmorphone (DILAUDID) injection 2 mg (2 mg Intravenous Given 05/18/20 1436)  HYDROmorphone (DILAUDID) injection 2 mg (2 mg Intravenous Given 05/18/20 1404)  diphenhydrAMINE (BENADRYL)  injection 25 mg (25 mg Intravenous Given 05/18/20 1404)  ketorolac (TORADOL) 15 MG/ML injection 15 mg (15 mg Intravenous Given 05/18/20 1405)  HYDROmorphone (DILAUDID) injection 2 mg (2 mg Intravenous Given 05/18/20 1508)  HYDROmorphone (DILAUDID) injection 2 mg (2 mg Intravenous Given 05/18/20 1732)    ED Course  I have reviewed the triage vital signs and the nursing notes.  Pertinent labs & imaging results that were available during my care of the patient were reviewed by me and considered in my medical decision making (see chart for details).    MDM Rules/Calculators/A&P                          Patient presenting with sickle cell crisis. Patient with typical symptoms and pain. Pt without CP, abdominal pain, or SOB. Pt is not exhibiting signs or symptoms of acute chest syndrome, organ failure, or DVT. Pt is afebrile, hemodynamically stable. WBC wnl. Retic appropriately elevated. Hgb without change from baseline. Patient treated with sickle cell protocol, however without significant improvement in symptoms. Sickle cell clinic consulted for admission for further management.   Sickle cell team will admit the patient for observation overnight.  Patient was informed of this  Final Clinical Impression(s) / ED Diagnoses Final diagnoses:  Sickle cell pain crisis (HCC)    Rx / DC Orders ED Discharge Orders         Ordered    oxyCODONE ER (XTAMPZA ER) 18 MG C12A  Every 12 hours       Note to Pharmacy: Please do not refill this medication prior to  04/23/2020. Thank you.   05/22/20 1217    Oxycodone HCl 20 MG TABS  Every 4 hours PRN,   Status:  Discontinued       Note to Pharmacy: Please do not refill this medication prior to 05/11/2020. Thank you.   05/22/20 1217    Increase activity slowly        05/22/20 1217    Diet - low sodium heart healthy        05/22/20 1217           Leone Brand 05/18/20 1511    Tilden Fossa, MD 05/18/20 1527    Mare Ferrari, PA-C 05/29/20  1707    Tilden Fossa, MD 06/04/20 [phone]

## 2020-05-18 NOTE — ED Notes (Addendum)
Patient provided with warm blankets, ginger ale, graham crackers and peanut butter per request

## 2020-05-19 DIAGNOSIS — M62838 Other muscle spasm: Secondary | ICD-10-CM | POA: Diagnosis present

## 2020-05-19 DIAGNOSIS — Z79899 Other long term (current) drug therapy: Secondary | ICD-10-CM | POA: Diagnosis not present

## 2020-05-19 DIAGNOSIS — R110 Nausea: Secondary | ICD-10-CM | POA: Diagnosis present

## 2020-05-19 DIAGNOSIS — F411 Generalized anxiety disorder: Secondary | ICD-10-CM | POA: Diagnosis present

## 2020-05-19 DIAGNOSIS — Z20822 Contact with and (suspected) exposure to covid-19: Secondary | ICD-10-CM | POA: Diagnosis present

## 2020-05-19 DIAGNOSIS — Z8616 Personal history of COVID-19: Secondary | ICD-10-CM | POA: Diagnosis not present

## 2020-05-19 DIAGNOSIS — G894 Chronic pain syndrome: Secondary | ICD-10-CM | POA: Diagnosis present

## 2020-05-19 DIAGNOSIS — Z6841 Body Mass Index (BMI) 40.0 and over, adult: Secondary | ICD-10-CM | POA: Diagnosis not present

## 2020-05-19 DIAGNOSIS — D5700 Hb-SS disease with crisis, unspecified: Secondary | ICD-10-CM | POA: Diagnosis present

## 2020-05-19 DIAGNOSIS — Z8249 Family history of ischemic heart disease and other diseases of the circulatory system: Secondary | ICD-10-CM | POA: Diagnosis not present

## 2020-05-19 DIAGNOSIS — Z832 Family history of diseases of the blood and blood-forming organs and certain disorders involving the immune mechanism: Secondary | ICD-10-CM | POA: Diagnosis not present

## 2020-05-19 DIAGNOSIS — Z833 Family history of diabetes mellitus: Secondary | ICD-10-CM | POA: Diagnosis not present

## 2020-05-19 DIAGNOSIS — D72829 Elevated white blood cell count, unspecified: Secondary | ICD-10-CM | POA: Diagnosis present

## 2020-05-19 DIAGNOSIS — F1120 Opioid dependence, uncomplicated: Secondary | ICD-10-CM | POA: Diagnosis present

## 2020-05-19 LAB — CBC
HCT: 33.2 % — ABNORMAL LOW (ref 36.0–46.0)
Hemoglobin: 11.1 g/dL — ABNORMAL LOW (ref 12.0–15.0)
MCH: 23.8 pg — ABNORMAL LOW (ref 26.0–34.0)
MCHC: 33.4 g/dL (ref 30.0–36.0)
MCV: 71.1 fL — ABNORMAL LOW (ref 80.0–100.0)
Platelets: 242 10*3/uL (ref 150–400)
RBC: 4.67 MIL/uL (ref 3.87–5.11)
RDW: 16.1 % — ABNORMAL HIGH (ref 11.5–15.5)
WBC: 10.6 10*3/uL — ABNORMAL HIGH (ref 4.0–10.5)
nRBC: 0.5 % — ABNORMAL HIGH (ref 0.0–0.2)

## 2020-05-19 LAB — HIV ANTIBODY (ROUTINE TESTING W REFLEX): HIV Screen 4th Generation wRfx: NONREACTIVE

## 2020-05-19 MED ORDER — HYDROMORPHONE 1 MG/ML IV SOLN
INTRAVENOUS | Status: DC
Start: 2020-05-19 — End: 2020-05-22
  Administered 2020-05-19: 30 mg via INTRAVENOUS
  Administered 2020-05-19: 2.7 mg via INTRAVENOUS
  Administered 2020-05-19: 8.5 mg via INTRAVENOUS
  Administered 2020-05-19: 2 mg via INTRAVENOUS
  Administered 2020-05-19: 4.5 mg via INTRAVENOUS
  Administered 2020-05-20: 8.5 mg via INTRAVENOUS
  Administered 2020-05-20: 4 mg via INTRAVENOUS
  Administered 2020-05-20: 6.5 mg via INTRAVENOUS
  Administered 2020-05-20: 3.5 mg via INTRAVENOUS
  Administered 2020-05-20: 9.5 mg via INTRAVENOUS
  Administered 2020-05-20: 30 mg via INTRAVENOUS
  Administered 2020-05-21: 5.5 mg via INTRAVENOUS
  Administered 2020-05-21: 2 mg via INTRAVENOUS
  Administered 2020-05-21: 7.5 mg via INTRAVENOUS
  Administered 2020-05-21: 3 mg via INTRAVENOUS
  Administered 2020-05-21 (×2): 7 mg via INTRAVENOUS
  Administered 2020-05-22: 1 mg via INTRAVENOUS
  Administered 2020-05-22: 7 mg via INTRAVENOUS
  Filled 2020-05-19 (×4): qty 30

## 2020-05-19 NOTE — Progress Notes (Signed)
Subjective: Patient continues to complain of pain primarily to lower extremities.  She rates pain as 9/10 characterized as constant and throbbing.  Pain persists despite IV Dilaudid PCA and home medications. She denies headache, chest pain, shortness of breath, urinary symptoms, nausea, vomiting, or diarrhea.  Objective:  Vital signs in last 24 hours:  Vitals:   05/19/20 0500 05/19/20 0656 05/19/20 0809 05/19/20 0933  BP:  (!) 144/81  (!) 144/81  Pulse:  78  81  Resp:  16 17 19   Temp:  97.9 F (36.6 C)  98.6 F (37 C)  TempSrc:  Oral  Oral  SpO2:  98% 98% 100%  Weight: 127 kg     Height:        Intake/Output from previous day:   Intake/Output Summary (Last 24 hours) at 05/19/2020 0955 Last data filed at 05/19/2020 05/21/2020 Gross per 24 hour  Intake 530 ml  Output 750 ml  Net -220 ml    Physical Exam: General: Alert, awake, oriented x3, in no acute distress.  HEENT: [place]/AT PEERL, EOMI Neck: Trachea midline,  no masses, no thyromegal,y no JVD, no carotid bruit OROPHARYNX:  Moist, No exudate/ erythema/lesions.  Heart: Regular rate and rhythm, without murmurs, rubs, gallops, PMI non-displaced, no heaves or thrills on palpation.  Lungs: Clear to auscultation, no wheezing or rhonchi noted. No increased vocal fremitus resonant to percussion  Abdomen: Soft, nontender, nondistended, positive bowel sounds, no masses no hepatosplenomegaly noted..  Neuro: No focal neurological deficits noted cranial nerves II through XII grossly intact. DTRs 2+ bilaterally upper and lower extremities. Strength 5 out of 5 in bilateral upper and lower extremities. Musculoskeletal: No warm swelling or erythema around joints, no spinal tenderness noted. Psychiatric: Patient alert and oriented x3, good insight and cognition, good recent to remote recall. Lymph node survey: No cervical axillary or inguinal lymphadenopathy noted.  Lab Results:  Basic Metabolic Panel:    Component Value Date/Time   NA 143  05/18/2020 1300   NA 142 09/11/2017 1431   K 4.9 05/18/2020 1300   CL 109 05/18/2020 1300   CO2 27 05/18/2020 1300   BUN 7 05/18/2020 1300   BUN 8 09/11/2017 1431   CREATININE 0.74 05/18/2020 1300   GLUCOSE 98 05/18/2020 1300   CALCIUM 9.3 05/18/2020 1300   CBC:    Component Value Date/Time   WBC 10.6 (H) 05/19/2020 0559   HGB 11.1 (L) 05/19/2020 0559   HGB 13.0 09/11/2017 1431   HCT 33.2 (L) 05/19/2020 0559   HCT 38.4 09/11/2017 1431   PLT 242 05/19/2020 0559   PLT 258 09/11/2017 1431   MCV 71.1 (L) 05/19/2020 0559   MCV 75 (L) 09/11/2017 1431   NEUTROABS 6.4 05/18/2020 1300   NEUTROABS 3.9 09/11/2017 1431   LYMPHSABS 2.0 05/18/2020 1300   LYMPHSABS 2.2 09/11/2017 1431   MONOABS 0.6 05/18/2020 1300   EOSABS 0.2 05/18/2020 1300   EOSABS 0.1 09/11/2017 1431   BASOSABS 0.0 05/18/2020 1300   BASOSABS 0.0 09/11/2017 1431    Recent Results (from the past 240 hour(s))  Resp Panel by RT-PCR (Flu A&B, Covid) Nasopharyngeal Swab     Status: None   Collection Time: 05/18/20  3:10 PM   Specimen: Nasopharyngeal Swab; Nasopharyngeal(NP) swabs in vial transport medium  Result Value Ref Range Status   SARS Coronavirus 2 by RT PCR NEGATIVE NEGATIVE Final    Comment: (NOTE) SARS-CoV-2 target nucleic acids are NOT DETECTED.  The SARS-CoV-2 RNA is generally detectable in upper respiratory specimens during  the acute phase of infection. The lowest concentration of SARS-CoV-2 viral copies this assay can detect is 138 copies/mL. A negative result does not preclude SARS-Cov-2 infection and should not be used as the sole basis for treatment or other patient management decisions. A negative result may occur with  improper specimen collection/handling, submission of specimen other than nasopharyngeal swab, presence of viral mutation(s) within the areas targeted by this assay, and inadequate number of viral copies(<138 copies/mL). A negative result must be combined with clinical  observations, patient history, and epidemiological information. The expected result is Negative.  Fact Sheet for Patients:  BloggerCourse.com  Fact Sheet for Healthcare Providers:  SeriousBroker.it  This test is no t yet approved or cleared by the [place] FDA and  has been authorized for detection and/or diagnosis of SARS-CoV-2 by FDA under an Emergency Use Authorization (EUA). This EUA will remain  in effect (meaning this test can be used) for the duration of the COVID-19 declaration under Section 564(b)(1) of the Act, 21 U.S.C.section 360bbb-3(b)(1), unless the authorization is terminated  or revoked sooner.       Influenza A by PCR NEGATIVE NEGATIVE Final   Influenza B by PCR NEGATIVE NEGATIVE Final    Comment: (NOTE) The Xpert Xpress SARS-CoV-2/FLU/RSV plus assay is intended as an aid in the diagnosis of influenza from Nasopharyngeal swab specimens and should not be used as a sole basis for treatment. Nasal washings and aspirates are unacceptable for Xpert Xpress SARS-CoV-2/FLU/RSV testing.  Fact Sheet for Patients: BloggerCourse.com  Fact Sheet for Healthcare Providers: SeriousBroker.it  This test is not yet approved or cleared by the [place] FDA and has been authorized for detection and/or diagnosis of SARS-CoV-2 by FDA under an Emergency Use Authorization (EUA). This EUA will remain in effect (meaning this test can be used) for the duration of the COVID-19 declaration under Section 564(b)(1) of the Act, 21 U.S.C. section 360bbb-3(b)(1), unless the authorization is terminated or revoked.  Performed at [facility], [address] [place], [place] [address]     Studies/Results: DG Chest Port 1 View  Result Date: 05/18/2020 CLINICAL DATA:  Sickle cell disease, pain in both legs and LEFT arm for more than a week EXAM: PORTABLE CHEST 1  VIEW COMPARISON:  Portable exam 1224 hours compared to 11/28/2019 FINDINGS: Borderline enlargement of cardiac silhouette. Mediastinal contours and pulmonary vascularity normal. Minimal bibasilar atelectasis. Lungs otherwise clear. No acute infiltrate, pleural effusion or pneumothorax. IMPRESSION: Minimal bibasilar atelectasis. Electronically Signed   By: Ulyses Southward M.D.   On: 05/18/2020 13:01    Medications: Scheduled Meds: . enoxaparin (LOVENOX) injection  40 mg Subcutaneous Q24H  . folic acid  1 mg Oral Daily  . HYDROmorphone   Intravenous Q4H  . ketorolac  15 mg Intravenous Q6H  . oxyCODONE  20 mg Oral Q12H  . senna-docusate  1 tablet Oral BID  . valACYclovir  500 mg Oral Daily   Continuous Infusions: PRN Meds:.diphenhydrAMINE, naloxone **AND** sodium chloride flush, ondansetron (ZOFRAN) IV, oxyCODONE, polyethylene glycol  Consultants:  None  Procedures:  None  Antibiotics:  None  Assessment/Plan: Principal Problem:   Sickle cell pain crisis (HCC) Active Problems:   Morbid obesity with BMI of 45.0-49.9, adult (HCC)   Chronic pain syndrome  Sickle cell disease with pain crisis: Reduce IV fluids to [facility] Weaning IV Dilaudid PCA, settings decreased IV Toradol 15 mg every 6 hours for total of 5 days Continue oxycodone 20 mg every 4 hours as needed for severe  breakthrough pain Monitor vital signs very closely, reevaluate pain scale regularly, and supplemental oxygen as needed.  Sickle cell anemia: Hemoglobin is stable and consistent with patient's baseline.  There is no clinical indication for blood transfusion at this time.  Continue to follow closely.  Chronic pain syndrome: Continue home medications  Morbid obesity: Continue heart healthy diet  History of generalized anxiety disorder: Continue home medications.  Patient currently denies any suicidal or homicidal ideations.  Continue to follow closely.  Code Status: Full Code Family Communication:  N/A Disposition Plan: Not yet ready for discharge  Kacyn Souder Rennis Petty  APRN, MSN, FNP-C [facility] [address] [place], [place] [address] [phone]  If 5PM-7AM, please contact night-coverage.  05/19/2020, 9:55 AM  LOS: 0 days

## 2020-05-20 DIAGNOSIS — D5700 Hb-SS disease with crisis, unspecified: Secondary | ICD-10-CM | POA: Diagnosis not present

## 2020-05-20 NOTE — Progress Notes (Signed)
Subjective: Patient continues to complain of pain primarily to lower extremities.  Pain is rated 8/10.  She denies any headache, chest pain, urinary symptoms, nausea, vomiting, or diarrhea.  Objective:  Vital signs in last 24 hours:  Vitals:   05/20/20 0729 05/20/20 1015 05/20/20 1230 05/20/20 1401  BP:  (!) 134/116  140/77  Pulse:  87  80  Resp: 13 14 11 18   Temp:  98.1 F (36.7 C)  (!) 97.5 F (36.4 C)  TempSrc:  Oral  Oral  SpO2: 98% 100% 99% 100%  Weight:      Height:        Intake/Output from previous day:   Intake/Output Summary (Last 24 hours) at 05/20/2020 1412 Last data filed at 05/20/2020 1359 Gross per 24 hour  Intake 480 ml  Output --  Net 480 ml    Physical Exam: General: Alert, awake, oriented x3, in no acute distress.  Morbid obesity. HEENT: [place]/AT PEERL, EOMI Neck: Trachea midline,  no masses, no thyromegal,y no JVD, no carotid bruit OROPHARYNX:  Moist, No exudate/ erythema/lesions.  Heart: Regular rate and rhythm, without murmurs, rubs, gallops, PMI non-displaced, no heaves or thrills on palpation.  Lungs: Clear to auscultation, no wheezing or rhonchi noted. No increased vocal fremitus resonant to percussion  Abdomen: Soft, nontender, nondistended, positive bowel sounds, no masses no hepatosplenomegaly noted..  Neuro: No focal neurological deficits noted cranial nerves II through XII grossly intact. DTRs 2+ bilaterally upper and lower extremities. Strength 5 out of 5 in bilateral upper and lower extremities. Musculoskeletal: No warm swelling or erythema around joints, no spinal tenderness noted. Psychiatric: Patient alert and oriented x3, good insight and cognition, good recent to remote recall. Lymph node survey: No cervical axillary or inguinal lymphadenopathy noted.  Lab Results:  Basic Metabolic Panel:    Component Value Date/Time   NA 143 05/18/2020 1300   NA 142 09/11/2017 1431   K 4.9 05/18/2020 1300   CL 109 05/18/2020 1300   CO2 27  05/18/2020 1300   BUN 7 05/18/2020 1300   BUN 8 09/11/2017 1431   CREATININE 0.74 05/18/2020 1300   GLUCOSE 98 05/18/2020 1300   CALCIUM 9.3 05/18/2020 1300   CBC:    Component Value Date/Time   WBC 10.6 (H) 05/19/2020 0559   HGB 11.1 (L) 05/19/2020 0559   HGB 13.0 09/11/2017 1431   HCT 33.2 (L) 05/19/2020 0559   HCT 38.4 09/11/2017 1431   PLT 242 05/19/2020 0559   PLT 258 09/11/2017 1431   MCV 71.1 (L) 05/19/2020 0559   MCV 75 (L) 09/11/2017 1431   NEUTROABS 6.4 05/18/2020 1300   NEUTROABS 3.9 09/11/2017 1431   LYMPHSABS 2.0 05/18/2020 1300   LYMPHSABS 2.2 09/11/2017 1431   MONOABS 0.6 05/18/2020 1300   EOSABS 0.2 05/18/2020 1300   EOSABS 0.1 09/11/2017 1431   BASOSABS 0.0 05/18/2020 1300   BASOSABS 0.0 09/11/2017 1431    Recent Results (from the past 240 hour(s))  Resp Panel by RT-PCR (Flu A&B, Covid) Nasopharyngeal Swab     Status: None   Collection Time: 05/18/20  3:10 PM   Specimen: Nasopharyngeal Swab; Nasopharyngeal(NP) swabs in vial transport medium  Result Value Ref Range Status   SARS Coronavirus 2 by RT PCR NEGATIVE NEGATIVE Final    Comment: (NOTE) SARS-CoV-2 target nucleic acids are NOT DETECTED.  The SARS-CoV-2 RNA is generally detectable in upper respiratory specimens during the acute phase of infection. The lowest concentration of SARS-CoV-2 viral copies this assay can detect is  138 copies/mL. A negative result does not preclude SARS-Cov-2 infection and should not be used as the sole basis for treatment or other patient management decisions. A negative result may occur with  improper specimen collection/handling, submission of specimen other than nasopharyngeal swab, presence of viral mutation(s) within the areas targeted by this assay, and inadequate number of viral copies(<138 copies/mL). A negative result must be combined with clinical observations, patient history, and epidemiological information. The expected result is Negative.  Fact Sheet for  Patients:  BloggerCourse.com  Fact Sheet for Healthcare Providers:  SeriousBroker.it  This test is no t yet approved or cleared by the [place] FDA and  has been authorized for detection and/or diagnosis of SARS-CoV-2 by FDA under an Emergency Use Authorization (EUA). This EUA will remain  in effect (meaning this test can be used) for the duration of the COVID-19 declaration under Section 564(b)(1) of the Act, 21 U.S.C.section 360bbb-3(b)(1), unless the authorization is terminated  or revoked sooner.       Influenza A by PCR NEGATIVE NEGATIVE Final   Influenza B by PCR NEGATIVE NEGATIVE Final    Comment: (NOTE) The Xpert Xpress SARS-CoV-2/FLU/RSV plus assay is intended as an aid in the diagnosis of influenza from Nasopharyngeal swab specimens and should not be used as a sole basis for treatment. Nasal washings and aspirates are unacceptable for Xpert Xpress SARS-CoV-2/FLU/RSV testing.  Fact Sheet for Patients: BloggerCourse.com  Fact Sheet for Healthcare Providers: SeriousBroker.it  This test is not yet approved or cleared by the [place] FDA and has been authorized for detection and/or diagnosis of SARS-CoV-2 by FDA under an Emergency Use Authorization (EUA). This EUA will remain in effect (meaning this test can be used) for the duration of the COVID-19 declaration under Section 564(b)(1) of the Act, 21 U.S.C. section 360bbb-3(b)(1), unless the authorization is terminated or revoked.  Performed at [facility], [address], [place], [place] [address]     Studies/Results: No results found.  Medications: Scheduled Meds: . enoxaparin (LOVENOX) injection  40 mg Subcutaneous Q24H  . folic acid  1 mg Oral Daily  . HYDROmorphone   Intravenous Q4H  . ketorolac  15 mg Intravenous Q6H  . oxyCODONE  20 mg Oral Q12H  . senna-docusate  1 tablet  Oral BID  . valACYclovir  500 mg Oral Daily   Continuous Infusions: PRN Meds:.diphenhydrAMINE, naloxone **AND** sodium chloride flush, ondansetron (ZOFRAN) IV, oxyCODONE, polyethylene glycol  Consultants:  None  Procedures:  None  Antibiotics:  None  Assessment/Plan: Principal Problem:   Sickle cell pain crisis (HCC) Active Problems:   Morbid obesity with BMI of 45.0-49.9, adult (HCC)   Chronic pain syndrome  Sickle cell disease with pain crisis: Continue IV fluids at [facility] Continue IV Dilaudid PCA IV Toradol 15 mg every 6 hours for total of 5 days Continue oxycodone 20 mg every 4 hours as needed for severe breakthrough pain. Monitor vital signs closely, reevaluate pain scale regularly, and supplemental oxygen as needed. Patient encouraged to mobilize.  Possible discharge in a.m.  Sickle cell anemia: Hemoglobin is stable and consistent with patient's baseline.  There is no clinical indication for blood transfusion at this time.  Continue to follow closely.  Chronic pain syndrome: Continue home medications  Morbid obesity: Continue heart healthy diet  History of generalized anxiety disorder: Continue home medications.  Patient currently denies any suicidal or homicidal ideations.  Continue to follow closely. Code Status: Full Code Family Communication: N/A Disposition Plan: Not yet ready  for discharge  Nolon Nations  APRN, MSN, FNP-C Patient Care [facility] [address] [place], [place] [address] [phone]  If 7PM-8AM, please contact night-coverage.  05/20/2020, 2:12 PM  LOS: 1 day

## 2020-05-21 DIAGNOSIS — D5700 Hb-SS disease with crisis, unspecified: Secondary | ICD-10-CM | POA: Diagnosis not present

## 2020-05-21 MED ORDER — SODIUM CHLORIDE 0.9% FLUSH: 10.0000 mL | INTRAVENOUS | Status: DC | PRN

## 2020-05-21 MED ORDER — SODIUM CHLORIDE 0.9% FLUSH
10.0000 mL | Freq: Two times a day (BID) | INTRAVENOUS | Status: DC
  Administered 2020-05-22 (×2): 10 mL

## 2020-05-21 NOTE — Progress Notes (Signed)
Subjective: 40 year old admitted with sickle cell painful crisis patient still having pain down to 6 out of 10.  Denied any fever or chills.  Denied any nausea vomiting or diarrhea she is taking her meds home regimen.  Objective: Vital signs in last 24 hours: Temp:  [97.7 F (36.5 C)-98.5 F (36.9 C)] 98 F (36.7 C) (04/18 1456) Pulse Rate:  [80-103] 84 (04/18 1456) Resp:  [14-18] 15 (04/18 1649) BP: (105-142)/(59-79) 133/72 (04/18 1456) SpO2:  [92 %-100 %] 100 % (04/18 1649) Weight change:  Last BM Date: 05/20/20  Intake/Output from previous day: 04/17 0701 - 04/18 0700 In: 491.5 [P.O.:480; I.V.:11.5] Out: -  Intake/Output this shift: Total I/O In: 120 [P.O.:120] Out: -   General appearance: alert, cooperative, appears stated age and no distress Neck: no adenopathy, no carotid bruit, no JVD, supple, symmetrical, trachea midline and thyroid not enlarged, symmetric, no tenderness/mass/nodules Back: symmetric, no curvature. ROM normal. No CVA tenderness. Resp: clear to auscultation bilaterally Cardio: regular rate and rhythm, S1, S2 normal, no murmur, click, rub or gallop GI: soft, non-tender; bowel sounds normal; no masses,  no organomegaly Extremities: extremities normal, atraumatic, no cyanosis or edema Pulses: 2+ and symmetric Skin: Skin color, texture, turgor normal. No rashes or lesions Neurologic: Grossly normal  Lab Results: Recent Labs    05/19/20 0559  WBC 10.6*  HGB 11.1*  HCT 33.2*  PLT 242   BMET No results for input(s): NA, K, CL, CO2, GLUCOSE, BUN, CREATININE, CALCIUM in the last 72 hours.  Studies/Results: No results found.  Medications: I have reviewed the patient's current medications.  Assessment/Plan: 40 year old female mated with sickle cell painful crisis.  #1 sickle cell painful crisis: Patient is doing better.  Pain management appears to be stable.  We will plan for possible discharge tomorrow.  Patient may require home regimen prior to  discharge.  #2 Leucocytosis: Monitor [place] count closely.  #3 chronic pain syndrome: Continue home   LOS: 2 days   Stephanie Burch,LAWAL 05/21/2020, 6:08 PM

## 2020-05-22 MED ORDER — XTAMPZA ER 18 MG PO C12A: 18.0000 mg | EXTENDED_RELEASE_CAPSULE | Freq: Two times a day (BID) | ORAL | 0 refills | Status: DC

## 2020-05-22 MED ORDER — PHENOL 1.4 % MT LIQD
1.0000 | OROMUCOSAL | Status: DC | PRN
  Filled 2020-05-22: qty 177

## 2020-05-22 MED ORDER — OXYCODONE HCL 20 MG PO TABS
20.0000 mg | ORAL_TABLET | ORAL | 0 refills | Status: DC | PRN
Start: 2020-05-22 — End: 2020-05-28

## 2020-05-22 NOTE — Discharge Summary (Signed)
Physician Discharge Summary  Patient ID: Stephanie Burch MRN: [id number] DOB/AGE: May 14, 1980 40 y.o.  Admit date: 05/18/2020 Discharge date: 05/22/2020  Admission Diagnoses:  Discharge Diagnoses:  Principal Problem:   Sickle cell pain crisis [facility]) Active Problems:   Morbid obesity with BMI of 45.0-49.9, adult (HCC)   Chronic pain syndrome   Discharged Condition: good  Hospital Course: Patient was admitted with sickle cell painful crisis.  Pain was initially 9 out of 10.  Associated with some nausea but no vomiting.  Patient has history of chronic pain syndrome also.  Also has been on home regimen.  Was seen and evaluated and placed on Dilaudid PCA Toradol as well as her oral oxycodone.  She did much better.  Patient subsequently discharged home to follow-up with PCP.  She did not require any transfusion and was doing much better.  Consults: None  Significant Diagnostic Studies: labs: Serial CBCs and CMP were checked.  No transfusion was required  Treatments: IV hydration and analgesia: acetaminophen and Dilaudid  Discharge Exam: Blood pressure 125/75, pulse 84, temperature 97.7 F (36.5 C), temperature source Oral, resp. rate 13, height 5\' 5"  (1.651 m), weight 127 kg, SpO2 100 %. General appearance: alert, cooperative and appears stated age Head: Normocephalic, without obvious abnormality, atraumatic Neck: no adenopathy, no carotid bruit, no JVD, supple, symmetrical, trachea midline and thyroid not enlarged, symmetric, no tenderness/mass/nodules Back: symmetric, no curvature. ROM normal. No CVA tenderness. Resp: clear to auscultation bilaterally Cardio: regular rate and rhythm, S1, S2 normal, no murmur, click, rub or gallop and normal apical impulse GI: soft, non-tender; bowel sounds normal; no masses,  no organomegaly Extremities: extremities normal, atraumatic, no cyanosis or edema Pulses: 2+ and symmetric Neurologic: Grossly normal  Disposition: Discharge disposition: 01-Home  or Self Care       Discharge Instructions    Diet - low sodium heart healthy   Complete by: As directed    Increase activity slowly   Complete by: As directed      Allergies as of 05/22/2020   No Known Allergies     Medication List    TAKE these medications   folic acid 1 MG tablet Commonly known as: FOLVITE Take 1 tablet (1 mg total) by mouth daily.   ibuprofen 800 MG tablet Commonly known as: ADVIL TAKE 1 TABLET (800 MG TOTAL) BY MOUTH EVERY 8 (EIGHT) HOURS AS NEEDED FOR HEADACHE, MILD PAIN, MODERATE PAIN OR CRAMPING.   ondansetron 4 MG tablet Commonly known as: ZOFRAN Take 4 mg by mouth every 8 (eight) hours as needed for nausea or vomiting.   Oxbryta 500 MG Tabs tablet Generic drug: voxelotor Take 1,500 mg by mouth daily.   Oxycodone HCl 20 MG Tabs Take 1 tablet (20 mg total) by mouth every 4 (four) hours as needed (pain).   tiZANidine 4 MG capsule Commonly known as: ZANAFLEX Take 1 capsule (4 mg total) by mouth 3 (three) times daily as needed for muscle spasms.   valACYclovir 500 MG tablet Commonly known as: Valtrex Take Valtrex 500 mg daily for suppression of outbreaks. Take Valtrex 1000 mg daily X 1 week for outbreaks. What changed:   how much to take  how to take this  when to take this  additional instructions   Vitamin D (Ergocalciferol) 1.25 MG (50000 UNIT) Caps capsule Commonly known as: DRISDOL Take 1 capsule (50,000 Units total) by mouth every 7 (seven) days. What changed: additional instructions   Xtampza ER 18 MG C12a Generic drug: oxyCODONE ER Take 18  mg by mouth every 12 (twelve) hours.        SignedLonia Blood 05/22/2020, 12:17 PM  Time spent 36 minutes

## 2020-05-22 NOTE — Plan of Care (Signed)
  Problem: Education: Goal: Knowledge of vaso-occlusive preventative measures will improve Outcome: Completed/Met Goal: Awareness of infection prevention will improve Outcome: Completed/Met Goal: Awareness of signs and symptoms of anemia will improve Outcome: Completed/Met Goal: Long-term complications will improve Outcome: Completed/Met   Problem: Self-Care: Goal: Ability to incorporate actions that prevent/reduce pain crisis will improve Outcome: Completed/Met   Problem: Bowel/Gastric: Goal: Gut motility will be maintained Outcome: Completed/Met   Problem: Tissue Perfusion: Goal: Complications related to inadequate tissue perfusion will be avoided or minimized Outcome: Completed/Met   Problem: Respiratory: Goal: Pulmonary complications will be avoided or minimized Outcome: Completed/Met Goal: Acute Chest Syndrome will be identified early to prevent complications Outcome: Completed/Met   Problem: Fluid Volume: Goal: Ability to maintain a balanced intake and output will improve Outcome: Completed/Met   Problem: Sensory: Goal: Pain level will decrease with appropriate interventions Outcome: Completed/Met   Problem: Health Behavior: Goal: Postive changes in compliance with treatment and prescription regimens will improve Outcome: Completed/Met   

## 2020-05-23 ENCOUNTER — Encounter: Payer: Self-pay | Admitting: Family Medicine

## 2020-05-23 ENCOUNTER — Telehealth: Payer: Self-pay | Admitting: *Deleted

## 2020-05-23 ENCOUNTER — Telehealth: Payer: Self-pay

## 2020-05-23 NOTE — Telephone Encounter (Signed)
Oxycodone 20 mg   Dr Mikeal Hawthorne gv her 102 quaninty n was asking for rest of pills to be sent as a refill

## 2020-05-23 NOTE — Telephone Encounter (Signed)
Transition Care Management Unsuccessful Follow-up Telephone Call  Date of discharge and from where:  05/22/2020 - [facility]  Attempts:  1st Attempt  Reason for unsuccessful TCM follow-up call:  Unable to leave message

## 2020-05-24 ENCOUNTER — Ambulatory Visit: Payer: Medicare Other | Admitting: Physical Therapy

## 2020-05-24 ENCOUNTER — Other Ambulatory Visit: Payer: Self-pay | Admitting: Family Medicine

## 2020-05-24 NOTE — Telephone Encounter (Signed)
Pt has already pick this medication up

## 2020-05-24 NOTE — Telephone Encounter (Signed)
Transition Care Management Unsuccessful Follow-up Telephone Call  Date of discharge and from where:  05/22/2020 - [facility]  Attempts:  2nd Attempt  Reason for unsuccessful TCM follow-up call:  Left voice message

## 2020-05-25 NOTE — Telephone Encounter (Signed)
Transition Care Management Unsuccessful Follow-up Telephone Call  Date of discharge and from where:  05/22/2020 - [facility]  Attempts:  3rd Attempt  Reason for unsuccessful TCM follow-up call:  Left voice message

## 2020-05-28 ENCOUNTER — Other Ambulatory Visit: Payer: Self-pay | Admitting: Family Medicine

## 2020-05-28 ENCOUNTER — Telehealth: Payer: Self-pay

## 2020-05-28 DIAGNOSIS — D571 Sickle-cell disease without crisis: Secondary | ICD-10-CM

## 2020-05-28 DIAGNOSIS — F1190 Opioid use, unspecified, uncomplicated: Secondary | ICD-10-CM

## 2020-05-28 DIAGNOSIS — G894 Chronic pain syndrome: Secondary | ICD-10-CM

## 2020-05-28 MED ORDER — OXYCODONE HCL 20 MG PO TABS: 20.0000 mg | ORAL_TABLET | ORAL | 0 refills | Status: DC | PRN

## 2020-05-28 NOTE — Telephone Encounter (Signed)
Pt states that she picked up Medication and it was wrong quantity and needs the rest to be sent for right quantity. She said that you all can do that and kept arguing with me . Please call pt

## 2020-05-28 NOTE — Progress Notes (Signed)
Reviewed PDMP substance reporting system prior to prescribing opiate medications. No inconsistencies noted.    Meds ordered this encounter  Medications  . Oxycodone HCl 20 MG TABS    Sig: Take 1 tablet (20 mg total) by mouth every 4 (four) hours as needed (pain).    Dispense:  30 tablet    Refill:  0    Order Specific Question:   Supervising Provider    Answer:   Quentin Angst [[id number]]  Nolon Nations  APRN, MSN, FNP-C Patient Care [facility] [address] [place], [place] [address] [phone]

## 2020-06-04 ENCOUNTER — Telehealth: Payer: Self-pay

## 2020-06-04 NOTE — Telephone Encounter (Signed)
Med refill Oxycodone 20 mg 

## 2020-06-05 ENCOUNTER — Other Ambulatory Visit: Payer: Self-pay | Admitting: Family Medicine

## 2020-06-05 DIAGNOSIS — G894 Chronic pain syndrome: Secondary | ICD-10-CM

## 2020-06-05 DIAGNOSIS — F1190 Opioid use, unspecified, uncomplicated: Secondary | ICD-10-CM

## 2020-06-05 DIAGNOSIS — D571 Sickle-cell disease without crisis: Secondary | ICD-10-CM

## 2020-06-05 MED ORDER — OXYCODONE HCL 20 MG PO TABS: 20.0000 mg | ORAL_TABLET | ORAL | 0 refills | Status: DC | PRN

## 2020-06-05 NOTE — Progress Notes (Signed)
Reviewed PDMP substance reporting system prior to prescribing opiate medications. No inconsistencies noted.   Meds ordered this encounter  Medications  . Oxycodone HCl 20 MG TABS    Sig: Take 1 tablet (20 mg total) by mouth every 4 (four) hours as needed (pain).    Dispense:  90 tablet    Refill:  0    Order Specific Question:   Supervising Provider    Answer:   Quentin Angst [[id number]]    Nolon Nations  APRN, MSN, FNP-C Patient Care [facility] [address] [place], [place] [address] [phone]

## 2020-06-07 ENCOUNTER — Other Ambulatory Visit: Payer: Self-pay | Admitting: Family Medicine

## 2020-06-07 ENCOUNTER — Encounter (HOSPITAL_COMMUNITY): Payer: Medicare Other

## 2020-06-12 ENCOUNTER — Non-Acute Institutional Stay (HOSPITAL_BASED_OUTPATIENT_CLINIC_OR_DEPARTMENT_OTHER)
Admission: AD | Admit: 2020-06-12 | Discharge: 2020-06-12 | Disposition: A | Discharge status: Home / Self Care | Payer: Medicare Other | Source: Ambulatory Visit | Attending: Internal Medicine | Admitting: Internal Medicine

## 2020-06-12 ENCOUNTER — Encounter (HOSPITAL_COMMUNITY): Payer: Medicare Other

## 2020-06-12 ENCOUNTER — Telehealth (HOSPITAL_COMMUNITY): Payer: Self-pay | Admitting: General Practice

## 2020-06-12 DIAGNOSIS — D5700 Hb-SS disease with crisis, unspecified: Secondary | ICD-10-CM | POA: Diagnosis present

## 2020-06-12 DIAGNOSIS — G894 Chronic pain syndrome: Secondary | ICD-10-CM | POA: Insufficient documentation

## 2020-06-12 DIAGNOSIS — D638 Anemia in other chronic diseases classified elsewhere: Secondary | ICD-10-CM | POA: Insufficient documentation

## 2020-06-12 DIAGNOSIS — Z79891 Long term (current) use of opiate analgesic: Secondary | ICD-10-CM | POA: Insufficient documentation

## 2020-06-12 DIAGNOSIS — Z79899 Other long term (current) drug therapy: Secondary | ICD-10-CM | POA: Insufficient documentation

## 2020-06-12 DIAGNOSIS — F1120 Opioid dependence, uncomplicated: Secondary | ICD-10-CM | POA: Insufficient documentation

## 2020-06-12 LAB — URINALYSIS, ROUTINE W REFLEX MICROSCOPIC
Bilirubin Urine: NEGATIVE
Glucose, UA: NEGATIVE mg/dL
Hgb urine dipstick: NEGATIVE
Ketones, ur: NEGATIVE mg/dL
Leukocytes,Ua: NEGATIVE
Nitrite: NEGATIVE
Protein, ur: NEGATIVE mg/dL
Specific Gravity, Urine: 1.011 (ref 1.005–1.030)
pH: 6 (ref 5.0–8.0)

## 2020-06-12 LAB — CBC WITH DIFFERENTIAL/PLATELET
Abs Immature Granulocytes: 0.06 10*3/uL (ref 0.00–0.07)
Basophils Absolute: 0.1 10*3/uL (ref 0.0–0.1)
Basophils Relative: 0 %
Eosinophils Absolute: 0.5 10*3/uL (ref 0.0–0.5)
Eosinophils Relative: 4 %
HCT: 35.3 % — ABNORMAL LOW (ref 36.0–46.0)
Hemoglobin: 12 g/dL (ref 12.0–15.0)
Immature Granulocytes: 0 %
Lymphocytes Relative: 28 %
Lymphs Abs: 3.8 10*3/uL (ref 0.7–4.0)
MCH: 23.4 pg — ABNORMAL LOW (ref 26.0–34.0)
MCHC: 34 g/dL (ref 30.0–36.0)
MCV: 68.8 fL — ABNORMAL LOW (ref 80.0–100.0)
Monocytes Absolute: 1 10*3/uL (ref 0.1–1.0)
Monocytes Relative: 7 %
Neutro Abs: 8.1 10*3/uL — ABNORMAL HIGH (ref 1.7–7.7)
Neutrophils Relative %: 61 %
Platelets: 290 10*3/uL (ref 150–400)
RBC: 5.13 MIL/uL — ABNORMAL HIGH (ref 3.87–5.11)
RDW: 15.9 % — ABNORMAL HIGH (ref 11.5–15.5)
WBC: 13.5 10*3/uL — ABNORMAL HIGH (ref 4.0–10.5)
nRBC: 0.6 % — ABNORMAL HIGH (ref 0.0–0.2)

## 2020-06-12 LAB — COMPREHENSIVE METABOLIC PANEL
ALT: 20 U/L (ref 0–44)
AST: 15 U/L (ref 15–41)
Albumin: 3.8 g/dL (ref 3.5–5.0)
Alkaline Phosphatase: 78 U/L (ref 38–126)
Anion gap: 8 (ref 5–15)
BUN: 12 mg/dL (ref 6–20)
CO2: 23 mmol/L (ref 22–32)
Calcium: 9.2 mg/dL (ref 8.9–10.3)
Chloride: 108 mmol/L (ref 98–111)
Creatinine, Ser: 0.58 mg/dL (ref 0.44–1.00)
GFR, Estimated: >60 mL/min (ref >60)
Glucose, Bld: 123 mg/dL — ABNORMAL HIGH (ref 70–99)
Potassium: 3.8 mmol/L (ref 3.5–5.1)
Sodium: 139 mmol/L (ref 135–145)
Total Bilirubin: 0.8 mg/dL (ref 0.3–1.2)
Total Protein: 7.4 g/dL (ref 6.5–8.1)

## 2020-06-12 LAB — RETICULOCYTES
Immature Retic Fract: 39.5 % — ABNORMAL HIGH (ref 2.3–15.9)
RBC.: 5.09 MIL/uL (ref 3.87–5.11)
Retic Count, Absolute: 196 10*3/uL — ABNORMAL HIGH (ref 19.0–186.0)
Retic Ct Pct: 3.9 % — ABNORMAL HIGH (ref 0.4–3.1)

## 2020-06-12 MED ORDER — SODIUM CHLORIDE 0.9% FLUSH: 9.0000 mL | INTRAVENOUS | Status: DC | PRN

## 2020-06-12 MED ORDER — SENNOSIDES-DOCUSATE SODIUM 8.6-50 MG PO TABS: 1.0000 | ORAL_TABLET | Freq: Two times a day (BID) | ORAL | Status: DC

## 2020-06-12 MED ORDER — POLYETHYLENE GLYCOL 3350 17 G PO PACK: 17.0000 g | PACK | Freq: Every day | ORAL | Status: DC | PRN

## 2020-06-12 MED ORDER — SODIUM CHLORIDE 0.9 % IV SOLN
25.0000 mg | INTRAVENOUS | Status: DC | PRN
  Filled 2020-06-12: qty 0.5

## 2020-06-12 MED ORDER — ONDANSETRON HCL 4 MG/2ML IJ SOLN
4.0000 mg | Freq: Four times a day (QID) | INTRAMUSCULAR | Status: DC | PRN
  Administered 2020-06-12: 4 mg via INTRAVENOUS
  Filled 2020-06-12: qty 2

## 2020-06-12 MED ORDER — SODIUM CHLORIDE 0.45 % IV SOLN: INTRAVENOUS | Status: DC

## 2020-06-12 MED ORDER — HYDROMORPHONE 1 MG/ML IV SOLN
INTRAVENOUS | Status: DC
  Administered 2020-06-12: 14.5 mg via INTRAVENOUS
  Administered 2020-06-12: 30 mg via INTRAVENOUS
  Filled 2020-06-12: qty 30

## 2020-06-12 MED ORDER — NALOXONE HCL 0.4 MG/ML IJ SOLN: 0.4000 mg | INTRAMUSCULAR | Status: DC | PRN

## 2020-06-12 MED ORDER — KETOROLAC TROMETHAMINE 30 MG/ML IJ SOLN
30.0000 mg | Freq: Once | INTRAMUSCULAR | Status: AC
  Administered 2020-06-12: 30 mg via INTRAVENOUS
  Filled 2020-06-12: qty 1

## 2020-06-12 MED ORDER — DIPHENHYDRAMINE HCL 25 MG PO CAPS: 25.0000 mg | ORAL_CAPSULE | ORAL | Status: DC | PRN

## 2020-06-12 NOTE — Telephone Encounter (Signed)
Patient called, requesting to come to the day hospital due to pain in the legs and arms rated at 9/10. Denied chest pain, fever, diarrhea, abdominal pain, nausea/vomitting. Screened negative for Covid-19 symptoms. Admitted to having means of transportation without driving self after treatment. Last took 20 mg of oxycodone at 02:30 am today. Per provider, patient can come to the day hospital for treatment. Patient notified, verbalized understanding.

## 2020-06-12 NOTE — Progress Notes (Signed)
Patient admitted to the day infusion hospital for sickle cell pain crisis. Initially, patient reported bilateral leg and left arm pain rated 9/10. For pain management, patient placed on Dilaudid PCA, given 30 mg Toradol and hydrated with IV fluids. At discharge, patient rated pain at 7/10. Vital signs stable. Discharge instructions given. Patient alert, oriented and ambulatory at discharge.

## 2020-06-12 NOTE — H&P (Signed)
[facility] History and Physical  Stephanie Burch LKG:[id number] DOB: December 22, 1980 DOA: 06/12/2020  PCP: Kallie Locks, FNP (Inactive)   Chief Complaint: Sickle cell pain  HPI: Stephanie Burch is a 40 y.o. female with history of sickle cell disease, chronic pain syndrome, opiate dependence and tolerance, generalized anxiety disorder, morbid obesity and anemia of chronic disease who presented to the day hospital today with major complaints of generalized body pain that is typical of her sickle cell pain crisis. Pain is more in her lower extremities and upper limbs. She rates her pain at 9/10, constant and throbbing. She took her home pain medications 20 mg of oxycodone at 2:30 AM with no sustained relief. There is no joint swelling or redness.  She denies any headache, fever, cough, chest pain, shortness of breath, nausea, vomiting, diarrhea or any urinary symptoms. She denies any exposure to COVID-19 patient, denies any sick contact or recent travels..  Systemic Review: General: The patient denies anorexia, fever, weight loss Cardiac: Denies chest pain, syncope, palpitations, pedal edema  Respiratory: Denies cough, shortness of breath, wheezing GI: Denies severe indigestion/heartburn, abdominal pain, nausea, vomiting, diarrhea and constipation GU: Denies hematuria, incontinence, dysuria  Musculoskeletal: Denies arthritis  Skin: Denies suspicious skin lesions Neurologic: Denies focal weakness or numbness, change in vision  Past Medical History:  Diagnosis Date  . HSV infection   . Nausea   . Sickle cell anemia (HCC)   . Vitamin B12 deficiency 12/2018  . Vitamin D deficiency     No past surgical history on file.  No Known Allergies  Family History  Problem Relation Age of Onset  . Hypertension Mother   . Sickle cell trait Mother   . Diabetes Father   . Sickle cell trait Father       Prior to Admission medications   Medication Sig Start Date End Date Taking?  Authorizing Provider  folic acid (FOLVITE) 1 MG tablet Take 1 tablet (1 mg total) by mouth daily. 11/15/18   Kallie Locks, FNP  ibuprofen (ADVIL) 800 MG tablet TAKE 1 TABLET (800 MG TOTAL) BY MOUTH EVERY 8 (EIGHT) HOURS AS NEEDED FOR HEADACHE, MILD PAIN, MODERATE PAIN OR CRAMPING. 04/10/20   Kallie Locks, FNP  ondansetron (ZOFRAN) 4 MG tablet Take 4 mg by mouth every 8 (eight) hours as needed for nausea or vomiting.  11/25/19   [provider]  oxyCODONE ER (XTAMPZA ER) 18 MG C12A Take 18 mg by mouth every 12 (twelve) hours. 05/22/20   Rometta Emery, MD  Oxycodone HCl 20 MG TABS Take 1 tablet (20 mg total) by mouth every 4 (four) hours as needed (pain). 06/07/20   Massie Maroon, FNP  tiZANidine (ZANAFLEX) 4 MG capsule Take 1 capsule (4 mg total) by mouth 3 (three) times daily as needed for muscle spasms. 05/08/20 12/04/20  Kallie Locks, FNP  valACYclovir (VALTREX) 500 MG tablet Take Valtrex 500 mg daily for suppression of outbreaks. Take Valtrex 1000 mg daily X 1 week for outbreaks. Patient taking differently: Take 500 mg by mouth daily. 10/20/19   Kallie Locks, FNP  Vitamin D, Ergocalciferol, (DRISDOL) 1.25 MG (50000 UT) CAPS capsule Take 1 capsule (50,000 Units total) by mouth every 7 (seven) days. Patient taking differently: Take 50,000 Units by mouth every 7 (seven) days. Take on Sunday 12/21/18   Kallie Locks, FNP  voxelotor (OXBRYTA) 500 MG TABS tablet Take 1,500 mg by mouth daily. Patient not taking: No sig reported 01/11/20  Kallie Locks, FNP     Physical Exam: There were no vitals filed for this visit.  General: Alert, awake, afebrile, anicteric, not in obvious distress HEENT: Normocephalic and Atraumatic, Mucous membranes pink                PERRLA; EOM intact; No scleral icterus,                 Nares: Patent, Oropharynx: Clear, Fair Dentition                 Neck: FROM, no cervical lymphadenopathy, thyromegaly, carotid bruit or JVD;  CHEST  WALL: No tenderness  CHEST: Normal respiration, clear to auscultation bilaterally  HEART: Regular rate and rhythm; no murmurs rubs or gallops  BACK: No kyphosis or scoliosis; no CVA tenderness  ABDOMEN: Positive Bowel Sounds, soft, non-tender; no masses, no organomegaly EXTREMITIES: No cyanosis, clubbing, or edema SKIN:  no rash or ulceration  CNS: Alert and Oriented x 4, Nonfocal exam, CN 2-12 intact  Labs on Admission:  Basic Metabolic Panel: No results for input(s): NA, K, CL, CO2, GLUCOSE, BUN, CREATININE, CALCIUM, MG, PHOS in the last 168 hours. Liver Function Tests: No results for input(s): AST, ALT, ALKPHOS, BILITOT, PROT, ALBUMIN in the last 168 hours. No results for input(s): LIPASE, AMYLASE in the last 168 hours. No results for input(s): AMMONIA in the last 168 hours. CBC: No results for input(s): WBC, NEUTROABS, HGB, HCT, MCV, PLT in the last 168 hours. Cardiac Enzymes: No results for input(s): CKTOTAL, CKMB, CKMBINDEX, TROPONINI in the last 168 hours.  BNP (last 3 results) No results for input(s): BNP in the last 8760 hours.  ProBNP (last 3 results) No results for input(s): PROBNP in the last 8760 hours.  CBG: No results for input(s): GLUCAP in the last 168 hours.   Assessment/Plan Active Problems:   Sickle cell anemia with crisis (HCC)   Admits to the Day Hospital for extended observation  IVF 0.45% Saline @ 150 mls/hour  Weight based Dilaudid PCA started within 30 minutes of admission  IV Toradol 15 mg Q 6 H x 2 doses  Acetaminophen 1000 mg x 1 dose  Labs: CBCD, CMP, Retic Count and LDH  Monitor vitals very closely, Re-evaluate pain scale every hour  2 L of Oxygen by [place]  Patient will be re-evaluated for pain in the context of function and relationship to baseline as care progresses.  If no significant relieve from pain (remains above 5/10) will transfer patient to inpatient services for further evaluation and management  Code Status:  Full  Family Communication: None  DVT Prophylaxis: Ambulate as tolerated   Time spent: 35 Minutes  Jeanann Lewandowsky, MD, MHA, FACP, FAAP, CPE  If 7PM-7AM, please contact night-coverage [email or web address] 06/12/2020, 10:00 AM

## 2020-06-13 ENCOUNTER — Encounter (HOSPITAL_COMMUNITY): Payer: Self-pay | Admitting: Family Medicine

## 2020-06-13 ENCOUNTER — Telehealth (HOSPITAL_COMMUNITY): Payer: Self-pay

## 2020-06-13 ENCOUNTER — Inpatient Hospital Stay (HOSPITAL_COMMUNITY)
Admission: AD | Admit: 2020-06-13 | Discharge: 2020-06-17 | DRG: 812 | Disposition: A | Discharge status: Home / Self Care | Payer: Medicare Other | Source: Ambulatory Visit | Attending: Internal Medicine | Admitting: Internal Medicine

## 2020-06-13 DIAGNOSIS — Z833 Family history of diabetes mellitus: Secondary | ICD-10-CM | POA: Diagnosis not present

## 2020-06-13 DIAGNOSIS — Z832 Family history of diseases of the blood and blood-forming organs and certain disorders involving the immune mechanism: Secondary | ICD-10-CM | POA: Diagnosis not present

## 2020-06-13 DIAGNOSIS — B009 Herpesviral infection, unspecified: Secondary | ICD-10-CM | POA: Diagnosis present

## 2020-06-13 DIAGNOSIS — D72829 Elevated white blood cell count, unspecified: Secondary | ICD-10-CM | POA: Diagnosis present

## 2020-06-13 DIAGNOSIS — E559 Vitamin D deficiency, unspecified: Secondary | ICD-10-CM | POA: Diagnosis present

## 2020-06-13 DIAGNOSIS — Z6841 Body Mass Index (BMI) 40.0 and over, adult: Secondary | ICD-10-CM | POA: Diagnosis not present

## 2020-06-13 DIAGNOSIS — D5700 Hb-SS disease with crisis, unspecified: Principal | ICD-10-CM | POA: Diagnosis present

## 2020-06-13 DIAGNOSIS — Z8249 Family history of ischemic heart disease and other diseases of the circulatory system: Secondary | ICD-10-CM

## 2020-06-13 DIAGNOSIS — E538 Deficiency of other specified B group vitamins: Secondary | ICD-10-CM | POA: Diagnosis present

## 2020-06-13 DIAGNOSIS — G894 Chronic pain syndrome: Secondary | ICD-10-CM | POA: Diagnosis present

## 2020-06-13 DIAGNOSIS — Z20822 Contact with and (suspected) exposure to covid-19: Secondary | ICD-10-CM | POA: Diagnosis present

## 2020-06-13 DIAGNOSIS — D638 Anemia in other chronic diseases classified elsewhere: Secondary | ICD-10-CM | POA: Diagnosis present

## 2020-06-13 LAB — SARS CORONAVIRUS 2 (TAT 6-24 HRS): SARS Coronavirus 2: NEGATIVE

## 2020-06-13 MED ORDER — OXYCODONE HCL ER 20 MG PO T12A
20.0000 mg | EXTENDED_RELEASE_TABLET | Freq: Two times a day (BID) | ORAL | Status: DC
  Administered 2020-06-13 – 2020-06-17 (×8): 20 mg via ORAL
  Filled 2020-06-13 (×8): qty 1

## 2020-06-13 MED ORDER — KETOROLAC TROMETHAMINE 15 MG/ML IJ SOLN
15.0000 mg | Freq: Four times a day (QID) | INTRAMUSCULAR | Status: DC
  Administered 2020-06-13 – 2020-06-17 (×16): 15 mg via INTRAVENOUS
  Filled 2020-06-13 (×16): qty 1

## 2020-06-13 MED ORDER — POLYETHYLENE GLYCOL 3350 17 G PO PACK: 17.0000 g | PACK | Freq: Every day | ORAL | Status: DC | PRN

## 2020-06-13 MED ORDER — VALACYCLOVIR HCL 500 MG PO TABS
500.0000 mg | ORAL_TABLET | Freq: Every day | ORAL | Status: DC
  Administered 2020-06-13 – 2020-06-17 (×5): 500 mg via ORAL
  Filled 2020-06-13 (×5): qty 1

## 2020-06-13 MED ORDER — ONDANSETRON HCL 4 MG/2ML IJ SOLN
4.0000 mg | Freq: Four times a day (QID) | INTRAMUSCULAR | Status: DC | PRN
  Administered 2020-06-13: 4 mg via INTRAVENOUS
  Filled 2020-06-13: qty 2

## 2020-06-13 MED ORDER — SODIUM CHLORIDE 0.9% FLUSH
9.0000 mL | INTRAVENOUS | Status: DC | PRN
  Administered 2020-06-14: 9 mL via INTRAVENOUS

## 2020-06-13 MED ORDER — HYDROMORPHONE 1 MG/ML IV SOLN
INTRAVENOUS | Status: DC
Start: 2020-06-13 — End: 2020-06-14
  Administered 2020-06-13: 30 mg via INTRAVENOUS
  Administered 2020-06-13: 0 mg via INTRAVENOUS
  Administered 2020-06-13: 7.5 mg via INTRAVENOUS
  Administered 2020-06-14: 30 mg via INTRAVENOUS
  Administered 2020-06-14: 4.5 mg via INTRAVENOUS
  Administered 2020-06-14: 6.5 mg via INTRAVENOUS
  Filled 2020-06-13 (×2): qty 30

## 2020-06-13 MED ORDER — SENNOSIDES-DOCUSATE SODIUM 8.6-50 MG PO TABS
1.0000 | ORAL_TABLET | Freq: Two times a day (BID) | ORAL | Status: DC
  Administered 2020-06-14 – 2020-06-17 (×7): 1 via ORAL
  Filled 2020-06-13 (×7): qty 1

## 2020-06-13 MED ORDER — ENOXAPARIN SODIUM 40 MG/0.4ML IJ SOSY
40.0000 mg | PREFILLED_SYRINGE | INTRAMUSCULAR | Status: DC
  Administered 2020-06-13 – 2020-06-16 (×4): 40 mg via SUBCUTANEOUS
  Filled 2020-06-13 (×4): qty 0.4

## 2020-06-13 MED ORDER — FOLIC ACID 1 MG PO TABS
1.0000 mg | ORAL_TABLET | Freq: Every day | ORAL | Status: DC
  Administered 2020-06-13 – 2020-06-17 (×5): 1 mg via ORAL
  Filled 2020-06-13 (×5): qty 1

## 2020-06-13 MED ORDER — NALOXONE HCL 0.4 MG/ML IJ SOLN: 0.4000 mg | INTRAMUSCULAR | Status: DC | PRN

## 2020-06-13 MED ORDER — DIPHENHYDRAMINE HCL 25 MG PO CAPS
25.0000 mg | ORAL_CAPSULE | ORAL | Status: DC | PRN
Start: 2020-06-13 — End: 2020-06-17

## 2020-06-13 MED ORDER — SODIUM CHLORIDE 0.45 % IV SOLN: INTRAVENOUS | Status: DC

## 2020-06-13 NOTE — H&P (Signed)
[facility] History and Physical   Date: 06/13/2020  Patient name: Stephanie Burch Medical record number: [id number] Date of birth: 02-17-1980 Age: 40 y.o. Gender: female PCP: Kallie Locks, FNP (Inactive)  Attending physician: Quentin Angst, MD  Chief Complaint: Sickle cell pain   History of Present Illness: Blasa Raisch is a 40 year old female with a medical history significant for sickle cell disease, opiate dependence and tolerance, morbid obesity, history of depression and anxiety, and history of anemia of chronic disease presents with complaints of low back and bilateral lower extremity pain that is consistent with her typical pain crisis.  Patient was treated and evaluated in the day clinic on yesterday and did not have sustained relief.  She says that pain was not well controlled at discharge, patient was given the option of returning today for further pain managed and extended observation.  She says that she last had oxycodone and extensive this a.m. without sustained relief.  She attributes current cranial crisis to increased daily activities over the past several weeks.  She rates her pain as 10/10 characterized as constant and throbbing.  She denies any headache, chest pain, shortness of breath, urinary symptoms, nausea, vomiting, or diarrhea.  No sick contacts, recent travel, or exposure to COVID-19.  Meds: Medications Prior to Admission  Medication Sig Dispense Refill Last Dose  . folic acid (FOLVITE) 1 MG tablet Take 1 tablet (1 mg total) by mouth daily. 30 tablet 11   . ibuprofen (ADVIL) 800 MG tablet TAKE 1 TABLET (800 MG TOTAL) BY MOUTH EVERY 8 (EIGHT) HOURS AS NEEDED FOR HEADACHE, MILD PAIN, MODERATE PAIN OR CRAMPING. 30 tablet 1   . ondansetron (ZOFRAN) 4 MG tablet Take 4 mg by mouth every 8 (eight) hours as needed for nausea or vomiting.      Marland Kitchen oxyCODONE ER (XTAMPZA ER) 18 MG C12A Take 18 mg by mouth every 12 (twelve) hours. 60 capsule 0   .  Oxycodone HCl 20 MG TABS Take 1 tablet (20 mg total) by mouth every 4 (four) hours as needed (pain). 90 tablet 0   . tiZANidine (ZANAFLEX) 4 MG capsule Take 1 capsule (4 mg total) by mouth 3 (three) times daily as needed for muscle spasms. 90 capsule 6   . valACYclovir (VALTREX) 500 MG tablet Take Valtrex 500 mg daily for suppression of outbreaks. Take Valtrex 1000 mg daily X 1 week for outbreaks. (Patient taking differently: Take 500 mg by mouth daily.) 90 tablet 11   . Vitamin D, Ergocalciferol, (DRISDOL) 1.25 MG (50000 UT) CAPS capsule Take 1 capsule (50,000 Units total) by mouth every 7 (seven) days. (Patient taking differently: Take 50,000 Units by mouth every 7 (seven) days. Take on Sunday) 5 capsule 6   . voxelotor (OXBRYTA) 500 MG TABS tablet Take 1,500 mg by mouth daily. (Patient not taking: No sig reported) 90 tablet 11     Allergies: Patient has no known allergies. Past Medical History:  Diagnosis Date  . HSV infection   . Nausea   . Sickle cell anemia (HCC)   . Vitamin B12 deficiency 12/2018  . Vitamin D deficiency    No past surgical history on file. Family History  Problem Relation Age of Onset  . Hypertension Mother   . Sickle cell trait Mother   . Diabetes Father   . Sickle cell trait Father    Social History   Socioeconomic History  . Marital status: Single    Spouse name: Not on file  .  Number of children: Not on file  . Years of education: Not on file  . Highest education level: Not on file  Occupational History  . Not on file  Tobacco Use  . Smoking status: Never Smoker  . Smokeless tobacco: Never Used  Vaping Use  . Vaping Use: Never used  Substance and Sexual Activity  . Alcohol use: Not Currently  . Drug use: Never  . Sexual activity: Yes    Birth control/protection: None  Other Topics Concern  . Not on file  Social History Narrative  . Not on file   Social Determinants of Health   Financial Resource Strain: Not on file  Food Insecurity: Not  on file  Transportation Needs: Not on file  Physical Activity: Not on file  Stress: Not on file  Social Connections: Not on file  Intimate Partner Violence: Not on file   Review of Systems  Constitutional: Negative.   HENT: Negative.   Eyes: Negative.   Respiratory: Negative.   Cardiovascular: Negative.   Gastrointestinal: Negative.   Genitourinary: Negative.   Musculoskeletal: Positive for back pain and joint pain.  Skin: Negative.   Neurological: Negative.   Psychiatric/Behavioral: Negative.     Physical Exam: Blood pressure 114/87, pulse (!) 107, temperature 98.9 F (37.2 C), temperature source Oral, SpO2 98 %. Physical Exam Constitutional:      Appearance: She is obese.  Eyes:     Pupils: Pupils are equal, round, and reactive to light.  Cardiovascular:     Rate and Rhythm: Normal rate and regular rhythm.     Pulses: Normal pulses.     Heart sounds: Normal heart sounds.  Pulmonary:     Effort: Pulmonary effort is normal.  Abdominal:     General: Abdomen is flat. Bowel sounds are normal.  Musculoskeletal:        General: Normal range of motion.  Skin:    General: Skin is warm.  Neurological:     General: No focal deficit present.     Mental Status: Mental status is at baseline.  Psychiatric:        Mood and Affect: Mood normal.        Thought Content: Thought content normal.        Judgment: Judgment normal.     Lab results: No results found for this or any previous visit (from the past 24 hour(s)).  Imaging results:  No results found.   Assessment & Plan: Patient admitted to sickle cell day infusion center for management of pain crisis.  Patient is opiate tolerant Initiate IV dilaudid PCA. Settings of 0.5 mg, 10-minute lockout, and 3 mg/h IV fluids, 0.45% saline at 100 mL/h Toradol 15 mg IV times one dose Tylenol 1000 mg by mouth times one dose Review CBC with differential, complete metabolic panel, and reticulocytes as results become available.   Pain intensity will be reevaluated in context of functioning and relationship to baseline as care progresses If pain intensity remains elevated and/or sudden change in hemodynamic stability transition to inpatient services for higher level of care.     Nolon Nations  APRN, MSN, FNP-C Patient Care [facility] [address] [place], [place] [address] [phone]   06/13/2020, 11:05 AM

## 2020-06-13 NOTE — H&P (Signed)
H&P  Patient Demographics:  Stephanie Burch, is a 40 y.o. female  MRN: [id number]   DOB - [birth year]  Admit Date - 06/13/2020  Outpatient Primary MD for the patient is Kallie Locks, FNP (Inactive)     HPI:    Stephanie Burch is a 41 year old female with a medical history significant for sickle cell disease, opiate dependence and tolerance, morbid obesity, history of depression and anxiety, and history of anemia of chronic disease presents with complaints of low back and bilateral lower extremity pain that is consistent with her typical pain crisis.  Patient was treated and evaluated in the day clinic on yesterday and did not have sustained relief.  She says that pain was not well controlled at discharge, patient was given the option of returning today for further pain managed and extended observation.  She says that she last had oxycodone and extensive this a.m. without sustained relief.  She attributes current cranial crisis to increased daily activities over the past several weeks.  She rates her pain as 10/10 characterized as constant and throbbing.  She denies any headache, chest pain, shortness of breath, urinary symptoms, nausea, vomiting, or diarrhea.  No sick contacts, recent travel, or exposure to COVID-19.  Sickle cell center course:  Vital signs recorded as: BP (!) 144/96 (BP Location: Left Arm)   Pulse 98   Temp 99.1 F (37.3 C) (Oral)   Resp 17   LMP 05/04/2020   SpO2 96%   Complete blood count shows: WBCs 13.5, hemoglobin 12, and platelets 290,000.  Complete metabolic panel is totally unremarkable.  COVID-19 test pending.  Patient's pain persists despite IV Dilaudid PCA, IV fluids, and IV Toradol.  Patient admitted to MedSurg for further management of sickle cell pain crisis.   Review of systems:  Review of Systems  Constitutional: Negative for fever and malaise/fatigue.  HENT: Negative.   Eyes: Negative.   Respiratory: Negative.   Cardiovascular: Negative.    Gastrointestinal: Negative for abdominal pain and vomiting.  Genitourinary: Negative.   Musculoskeletal: Positive for back pain and joint pain.  Skin: Negative.   Neurological: Negative.   Endo/Heme/Allergies: Negative.   Psychiatric/Behavioral: Negative.      With Past History of the following :   Past Medical History:  Diagnosis Date  . HSV infection   . Nausea   . Sickle cell anemia (HCC)   . Vitamin B12 deficiency 12/2018  . Vitamin D deficiency       History reviewed. No pertinent surgical history.   Social History:   Social History   Tobacco Use  . Smoking status: Never Smoker  . Smokeless tobacco: Never Used  Substance Use Topics  . Alcohol use: Not Currently     Lives - At home   Family History :   Family History  Problem Relation Age of Onset  . Hypertension Mother   . Sickle cell trait Mother   . Diabetes Father   . Sickle cell trait Father      Home Medications:   Prior to Admission medications   Medication Sig Start Date End Date Taking? Authorizing Provider  folic acid (FOLVITE) 1 MG tablet Take 1 tablet (1 mg total) by mouth daily. 11/15/18   Kallie Locks, FNP  ibuprofen (ADVIL) 800 MG tablet TAKE 1 TABLET (800 MG TOTAL) BY MOUTH EVERY 8 (EIGHT) HOURS AS NEEDED FOR HEADACHE, MILD PAIN, MODERATE PAIN OR CRAMPING. 04/10/20   Kallie Locks, FNP  ondansetron (ZOFRAN) 4 MG tablet Take 4 mg  by mouth every 8 (eight) hours as needed for nausea or vomiting.  11/25/19   [provider]  oxyCODONE ER (XTAMPZA ER) 18 MG C12A Take 18 mg by mouth every 12 (twelve) hours. 05/22/20   Rometta Emery, MD  Oxycodone HCl 20 MG TABS Take 1 tablet (20 mg total) by mouth every 4 (four) hours as needed (pain). 06/07/20   Massie Maroon, FNP  tiZANidine (ZANAFLEX) 4 MG capsule Take 1 capsule (4 mg total) by mouth 3 (three) times daily as needed for muscle spasms. 05/08/20 12/04/20  Kallie Locks, FNP  valACYclovir (VALTREX) 500 MG tablet Take Valtrex  500 mg daily for suppression of outbreaks. Take Valtrex 1000 mg daily X 1 week for outbreaks. Patient taking differently: Take 500 mg by mouth daily. 10/20/19   Kallie Locks, FNP  Vitamin D, Ergocalciferol, (DRISDOL) 1.25 MG (50000 UT) CAPS capsule Take 1 capsule (50,000 Units total) by mouth every 7 (seven) days. Patient taking differently: Take 50,000 Units by mouth every 7 (seven) days. Take on Sunday 12/21/18   Kallie Locks, FNP  voxelotor (OXBRYTA) 500 MG TABS tablet Take 1,500 mg by mouth daily. Patient not taking: No sig reported 01/11/20   Kallie Locks, FNP     Allergies:   No Known Allergies   Physical Exam:   Vitals:   Vitals:   06/13/20 1445 06/13/20 1515  BP: 135/81 138/85  Pulse: (!) 109 100  Resp: 16 18  Temp:    SpO2: 98% 100%    Physical Exam: Constitutional: Patient appears well-developed and well-nourished. Not in obvious distress. HENT: Normocephalic, atraumatic, External right and left ear normal. Oropharynx is clear and moist.  Eyes: Conjunctivae and EOM are normal. PERRLA, no scleral icterus. Neck: Normal ROM. Neck supple. No JVD. No tracheal deviation. No thyromegaly. CVS: RRR, S1/S2 +, no murmurs, no gallops, no carotid bruit.  Pulmonary: Effort and breath sounds normal, no stridor, rhonchi, wheezes, rales.  Abdominal: Soft. BS +, no distension, tenderness, rebound or guarding.  Musculoskeletal: Normal range of motion. No edema and no tenderness.  Lymphadenopathy: No lymphadenopathy noted, cervical, inguinal or axillary Neuro: Alert. Normal reflexes, muscle tone coordination. No cranial nerve deficit. Skin: Skin is warm and dry. No rash noted. Not diaphoretic. No erythema. No pallor. Psychiatric: Normal mood and affect. Behavior, judgment, thought content normal.   Data Review:   CBC Recent Labs  Lab 06/12/20 1023  WBC 13.5*  HGB 12.0  HCT 35.3*  PLT 290  MCV 68.8*  MCH 23.4*  MCHC 34.0  RDW 15.9*  LYMPHSABS 3.8  MONOABS 1.0   EOSABS 0.5  BASOSABS 0.1   ------------------------------------------------------------------------------------------------------------------  Chemistries  Recent Labs  Lab 06/12/20 1023  NA 139  K 3.8  CL 108  CO2 23  GLUCOSE 123*  BUN 12  CREATININE 0.58  CALCIUM 9.2  AST 15  ALT 20  ALKPHOS 78  BILITOT 0.8   ------------------------------------------------------------------------------------------------------------------ CrCl cannot be calculated (Unknown ideal weight.). ------------------------------------------------------------------------------------------------------------------ No results for input(s): TSH, T4TOTAL, T3FREE, THYROIDAB in the last 72 hours.  Invalid input(s): FREET3  Coagulation profile No results for input(s): INR, PROTIME in the last 168 hours. ------------------------------------------------------------------------------------------------------------------- No results for input(s): DDIMER in the last 72 hours. -------------------------------------------------------------------------------------------------------------------  Cardiac Enzymes No results for input(s): CKMB, TROPONINI, MYOGLOBIN in the last 168 hours.  Invalid input(s): CK ------------------------------------------------------------------------------------------------------------------ No results found for: BNP  ---------------------------------------------------------------------------------------------------------------  Urinalysis    Component Value Date/Time   COLORURINE YELLOW 06/12/2020 1025   APPEARANCEUR CLEAR  06/12/2020 1025   LABSPEC 1.011 06/12/2020 1025   PHURINE 6.0 06/12/2020 1025   GLUCOSEU NEGATIVE 06/12/2020 1025   HGBUR NEGATIVE 06/12/2020 1025   BILIRUBINUR NEGATIVE 06/12/2020 1025   BILIRUBINUR Negative 04/27/2019 0935   KETONESUR NEGATIVE 06/12/2020 1025   PROTEINUR NEGATIVE 06/12/2020 1025   UROBILINOGEN 1.0 04/27/2019 0935   NITRITE NEGATIVE  06/12/2020 1025   LEUKOCYTESUR NEGATIVE 06/12/2020 1025    ----------------------------------------------------------------------------------------------------------------   Imaging Results:    No results found.   Assessment & Plan:  Principal Problem:   Sickle cell pain crisis (HCC) Active Problems:   Leukocytosis   Morbid obesity with BMI of 45.0-49.9, adult (HCC)   Chronic pain syndrome  Sickle cell disease with pain crisis:   Admit patient, start IVF D5 .45% at 100 mls/hour Continue IV Dilaudid PCA with settings of 0.5 mg, 10-minute lockout, and 3 mg/h Toradol 15 mg IV every 6 hours OxyContin 20 mg every 12 hours Monitor vital signs very closely, reevaluate pain scale regularly, and supplemental oxygen as needed Patient's pain will be reevaluated in the context of function and relationship to baseline as care progresses.  Leukocytosis: WBCs 13.5.  Patient is afebrile.  She shows no signs of active infection.  Continue to monitor closely without antibiotics. CBC in a.m.  Anemia of chronic disease: Hemoglobin is stable and consistent with patient's baseline.  There is no clinical indication for blood transfusion.  Continue to follow closely.  Morbid obesity: Heart healthy diet.  Patient counseled at length on the importance of following a low-fat, low carbohydrate diet divided over small meals throughout the day.  Patient has been on able to exercise due to chronic pain.   DVT Prophylaxis: Subcut Lovenox   AM Labs Ordered, also please review Full Orders  Family Communication: Admission, patient's condition and plan of care including tests being ordered have been discussed with the patient who indicate understanding and agree with the plan and Code Status.  Code Status: Full Code  Consults called: None    Admission status: Inpatient    Time spent in minutes : 35 minutes   Nolon Nations  APRN, MSN, FNP-C Patient Care [facility] [address] [place], [place] [address] [phone]  06/13/2020 at 3:38 PM

## 2020-06-13 NOTE — Telephone Encounter (Signed)
Patient called in. Complains of pain in legs and arms bilaterally rates 8/10. Denied chest pain, abd pain, fever, N/V/D. Wants to come in for treatment. Last took Oxycodone 20mg  at 11 pm last night. Pt states transport service is her ride today. Denies exposure to anyone covid positive in last two weeks, denies flu like symptoms today. FNP made aware, approved for pt to come to day hospital. Pt notified, verbalized understanding.

## 2020-06-13 NOTE — Discharge Instructions (Signed)
Sickle Cell Anemia, Adult  Sickle cell anemia is a condition where your red blood cells are shaped like sickles. Red blood cells carry oxygen through the body. Sickle-shaped cells do not live as long as normal red blood cells. They also clump together and block blood from flowing through the blood vessels. This prevents the body from getting enough oxygen. Sickle cell anemia causes organ damage and pain. It also increases the risk of infection. Follow these instructions at home: Medicines  Take over-the-counter and prescription medicines only as told by your doctor.  If you were prescribed an antibiotic medicine, take it as told by your doctor. Do not stop taking the antibiotic even if you start to feel better.  If you develop a fever, do not take medicines to lower the fever right away. Tell your doctor about the fever. Managing pain, stiffness, and swelling  Try these methods to help with pain: ? Use a heating pad. ? Take a warm bath. ? Distract yourself, such as by watching TV. Eating and drinking  Drink enough fluid to keep your pee (urine) clear or pale yellow. Drink more in hot weather and during exercise.  Limit or avoid alcohol.  Eat a healthy diet. Eat plenty of fruits, vegetables, whole grains, and lean protein.  Take vitamins and supplements as told by your doctor. Traveling  When traveling, keep these with you: ? Your medical information. ? The names of your doctors. ? Your medicines.  If you need to take an airplane, talk to your doctor first. Activity  Rest often.  Avoid exercises that make your heart beat much faster, such as jogging. General instructions  Do not use products that have nicotine or tobacco, such as cigarettes and e-cigarettes. If you need help quitting, ask your doctor.  Consider wearing a medical alert bracelet.  Avoid being in high places (high altitudes), such as mountains.  Avoid very hot or cold temperatures.  Avoid places where the  temperature changes a lot.  Keep all follow-up visits as told by your doctor. This is important. Contact a doctor if:  A joint hurts.  Your feet or hands hurt or swell.  You feel tired (fatigued). Get help right away if:  You have symptoms of infection. These include: ? Fever. ? Chills. ? Being very tired. ? Irritability. ? Poor eating. ? Throwing up (vomiting).  You feel dizzy or faint.  You have new stomach pain, especially on the left side.  You have a an erection (priapism) that lasts more than 4 hours.  You have numbness in your arms or legs.  You have a hard time moving your arms or legs.  You have trouble talking.  You have pain that does not go away when you take medicine.  You are short of breath.  You are breathing fast.  You have a long-term cough.  You have pain in your chest.  You have a bad headache.  You have a stiff neck.  Your stomach looks bloated even though you did not eat much.  Your skin is pale.  You suddenly cannot see well. Summary  Sickle cell anemia is a condition where your red blood cells are shaped like sickles.  Follow your doctor's advice on ways to manage pain, food to eat, activities to do, and steps to take for safe travel.  Get medical help right away if you have any signs of infection, such as a fever. This information is not intended to replace advice given to you by   your health care provider. Make sure you discuss any questions you have with your health care provider. Document Revised: 06/16/2019 Document Reviewed: 06/16/2019 Elsevier Patient Education  2021 [employer]  

## 2020-06-13 NOTE — Discharge Summary (Signed)
Physician Discharge Summary  Stephanie Burch FGH:[id number] DOB: 06-May-1980 DOA: 06/12/2020  PCP: Kallie Locks, FNP (Inactive)  Admit date: 06/12/2020  Discharge date: 06/12/2020  Time spent: 30 minutes  Discharge Diagnoses:  Active Problems:   Sickle cell anemia with crisis [facility])   Discharge Condition: Stable  Diet recommendation: Regular  History of present illness:  Stephanie Burch is a 40 y.o. female with history of sickle cell disease, chronic pain syndrome, opiate dependence and tolerance, generalized anxiety disorder, morbid obesity and anemia of chronic disease who presented to the day hospital today with major complaints of generalized body pain that is typical of her sickle cell pain crisis. Pain is more in her lower extremities and upper limbs. She rates her pain at 9/10, constant and throbbing. She took her home pain medications 20 mg of oxycodone at 2:30 AM with no sustained relief. There is no joint swelling or redness.  She denies any headache, fever, cough, chest pain, shortness of breath, nausea, vomiting, diarrhea or any urinary symptoms. She denies any exposure to COVID-19 patient, denies any sick contact or recent travels.Marland Kitchen  Hospital Course:  Stephanie Burch was admitted to the day hospital with sickle cell painful crisis. Patient was treated with IV fluid, weight based IV Dilaudid PCA, IV Toradol, clinician assisted doses as deemed appropriate, and other adjunct therapies per sickle cell pain management protocol. Stephanie Burch showed significant improvement symptomatically, pain improved from 9/10 to 6/10 at the time of discharge. Patient was discharged home in a hemodynamically stable condition. Stephanie Burch will follow-up at the clinic as previously scheduled, continue with home medications as per prior to admission.  Discharge Instructions We discussed the need for good hydration, monitoring of hydration status, avoidance of heat, cold, stress, and infection triggers. We discussed  the need to be compliant with taking Hydrea and other home medications. Stephanie Burch was reminded of the need to seek medical attention immediately if any symptom of bleeding, anemia, or infection occurs.  Discharge Exam: Vitals:   06/12/20 1515 06/12/20 1621  BP: 100/72 133/87  Pulse: 98 (!) 107  Resp: 16 16  Temp:    SpO2: 97% 98%    General appearance: alert, cooperative and no distress Eyes: conjunctivae/corneas clear. PERRL, EOM's intact. Fundi benign. Neck: no adenopathy, no carotid bruit, no JVD, supple, symmetrical, trachea midline and thyroid not enlarged, symmetric, no tenderness/mass/nodules Back: symmetric, no curvature. ROM normal. No CVA tenderness. Resp: clear to auscultation bilaterally Chest wall: no tenderness Cardio: regular rate and rhythm, S1, S2 normal, no murmur, click, rub or gallop GI: soft, non-tender; bowel sounds normal; no masses,  no organomegaly Extremities: extremities normal, atraumatic, no cyanosis or edema Pulses: 2+ and symmetric Skin: Skin color, texture, turgor normal. No rashes or lesions Neurologic: Grossly normal  Discharge Instructions    Diet - low sodium heart healthy   Complete by: As directed    Increase activity slowly   Complete by: As directed      Allergies as of 06/12/2020   No Known Allergies     Medication List    TAKE these medications   folic acid 1 MG tablet Commonly known as: FOLVITE Take 1 tablet (1 mg total) by mouth daily.   ibuprofen 800 MG tablet Commonly known as: ADVIL TAKE 1 TABLET (800 MG TOTAL) BY MOUTH EVERY 8 (EIGHT) HOURS AS NEEDED FOR HEADACHE, MILD PAIN, MODERATE PAIN OR CRAMPING.   ondansetron 4 MG tablet Commonly known as: ZOFRAN Take 4 mg by mouth every 8 (eight) hours as needed  for nausea or vomiting.   Oxycodone HCl 20 MG Tabs Take 1 tablet (20 mg total) by mouth every 4 (four) hours as needed (pain).   tiZANidine 4 MG capsule Commonly known as: ZANAFLEX Take 1 capsule (4 mg total) by mouth  3 (three) times daily as needed for muscle spasms.   valACYclovir 500 MG tablet Commonly known as: Valtrex Take Valtrex 500 mg daily for suppression of outbreaks. Take Valtrex 1000 mg daily X 1 week for outbreaks. What changed:   how much to take  how to take this  when to take this  additional instructions   Vitamin D (Ergocalciferol) 1.25 MG (50000 UNIT) Caps capsule Commonly known as: DRISDOL Take 1 capsule (50,000 Units total) by mouth every 7 (seven) days. What changed: additional instructions   Xtampza ER 18 MG C12a Generic drug: oxyCODONE ER Take 18 mg by mouth every 12 (twelve) hours.      No Known Allergies   Significant Diagnostic Studies: DG Chest Port 1 View  Result Date: 05/18/2020 CLINICAL DATA:  Sickle cell disease, pain in both legs and LEFT arm for more than a week EXAM: PORTABLE CHEST 1 VIEW COMPARISON:  Portable exam 1224 hours compared to 11/28/2019 FINDINGS: Borderline enlargement of cardiac silhouette. Mediastinal contours and pulmonary vascularity normal. Minimal bibasilar atelectasis. Lungs otherwise clear. No acute infiltrate, pleural effusion or pneumothorax. IMPRESSION: Minimal bibasilar atelectasis. Electronically Signed   By: Ulyses Southward M.D.   On: 05/18/2020 13:01    Signed:  Jeanann Lewandowsky MD, MHA, Maxwell Caul, CPE   06/13/2020, 6:12 PM

## 2020-06-13 NOTE — Progress Notes (Signed)
Patient admitted to the day hospital for treatment of sickle cell pain crisis. Patient reported pain rated 8/10 in the legs and left arm. Placed on Dilaudid PCA, given IV Zofran and hydrated with IV fluids. Report was given to [facility]. Transferred to [facility]. Reported pain on transfer was  8/10. Alert, oriented and transported in a wheel chair.

## 2020-06-14 LAB — CBC
HCT: 30.1 % — ABNORMAL LOW (ref 36.0–46.0)
Hemoglobin: 10.1 g/dL — ABNORMAL LOW (ref 12.0–15.0)
MCH: 23.1 pg — ABNORMAL LOW (ref 26.0–34.0)
MCHC: 33.6 g/dL (ref 30.0–36.0)
MCV: 68.9 fL — ABNORMAL LOW (ref 80.0–100.0)
Platelets: 224 10*3/uL (ref 150–400)
RBC: 4.37 MIL/uL (ref 3.87–5.11)
RDW: 15.6 % — ABNORMAL HIGH (ref 11.5–15.5)
WBC: 11.2 10*3/uL — ABNORMAL HIGH (ref 4.0–10.5)
nRBC: 0.5 % — ABNORMAL HIGH (ref 0.0–0.2)

## 2020-06-14 MED ORDER — SODIUM CHLORIDE 0.9% FLUSH: 10.0000 mL | INTRAVENOUS | Status: DC | PRN

## 2020-06-14 MED ORDER — HYDROMORPHONE 1 MG/ML IV SOLN
INTRAVENOUS | Status: DC
  Administered 2020-06-14: 30 mg via INTRAVENOUS
  Administered 2020-06-14: 1 mg via INTRAVENOUS
  Administered 2020-06-15: 7 mg via INTRAVENOUS
  Administered 2020-06-15: 9.5 mg via INTRAVENOUS
  Administered 2020-06-15: 2.5 mg via INTRAVENOUS
  Administered 2020-06-15: 8 mg via INTRAVENOUS
  Administered 2020-06-15: 3 mg via INTRAVENOUS
  Administered 2020-06-15: 7 mg via INTRAVENOUS
  Administered 2020-06-16: 0 mg via INTRAVENOUS
  Administered 2020-06-16: 3 mg via INTRAVENOUS
  Administered 2020-06-16: 6.5 mL via INTRAVENOUS
  Administered 2020-06-16: 8.5 mg via INTRAVENOUS
  Administered 2020-06-16: 30 mg via INTRAVENOUS
  Administered 2020-06-16: 7 mg via INTRAVENOUS
  Administered 2020-06-17: 30 mg via INTRAVENOUS
  Administered 2020-06-17: 4.5 mg via INTRAVENOUS
  Administered 2020-06-17: 7.5 mg via INTRAVENOUS
  Administered 2020-06-17: 7.5 mL via INTRAVENOUS
  Filled 2020-06-14 (×4): qty 30

## 2020-06-14 MED ORDER — SODIUM CHLORIDE 0.9% FLUSH
10.0000 mL | Freq: Two times a day (BID) | INTRAVENOUS | Status: DC
  Administered 2020-06-14 – 2020-06-16 (×2): 10 mL

## 2020-06-14 MED ORDER — OXYCODONE HCL 5 MG PO TABS
20.0000 mg | ORAL_TABLET | ORAL | Status: DC | PRN
  Administered 2020-06-14 – 2020-06-17 (×14): 20 mg via ORAL
  Filled 2020-06-14 (×14): qty 4

## 2020-06-14 NOTE — Plan of Care (Signed)
Pt with difficulty sleeping through the night; MD and IV line team contacted for MidLine placement. Pt now has a 24 line on her right finger; dressing reinforced but not intact after pt washes hands. Able to give medication, pt on PCA pump. IV team reports they will come first thing this am. No s/s of acute distress reported or observed; call light within reach and bed at lowest position for safety. AAOx4 Pt will discuss pain management with MD; she reports pain not well managed with present treatment. She was ok through the night but wants to review with MD.

## 2020-06-14 NOTE — Progress Notes (Signed)
Subjective: Stephanie Burch is a 40 year old female with a medical history significant for sickle cell disease, chronic pain syndrome, morbid obesity, opiate dependence and tolerance, and history of anemia of chronic disease was admitted for sickle cell pain crisis. Patient continues to complain of significant pain primarily to lower extremities.  She rates her pain as 8/10 characterized as constant and aching.  She denies any headache, chest pain, shortness of breath, urinary symptoms, nausea, vomiting, or diarrhea.  Objective:  Vital signs in last 24 hours:  Vitals:   06/14/20 0416 06/14/20 0744 06/14/20 1004 06/14/20 1216  BP: 130/65  140/89   Pulse: 91  83   Resp: 19 18 18 18   Temp: 98 F (36.7 C)  98.9 F (37.2 C)   TempSrc: Oral  Oral   SpO2: 99% 99% 100% 100%    Intake/Output from previous day:   Intake/Output Summary (Last 24 hours) at 06/14/2020 1241 Last data filed at 06/14/2020 1005 Gross per 24 hour  Intake 1060 ml  Output 1200 ml  Net -140 ml    Physical Exam: General: Alert, awake, oriented x3, in no acute distress.  HEENT: [place]/AT PEERL, EOMI Neck: Trachea midline,  no masses, no thyromegal,y no JVD, no carotid bruit OROPHARYNX:  Moist, No exudate/ erythema/lesions.  Heart: Regular rate and rhythm, without murmurs, rubs, gallops, PMI non-displaced, no heaves or thrills on palpation.  Lungs: Clear to auscultation, no wheezing or rhonchi noted. No increased vocal fremitus resonant to percussion  Abdomen: Soft, nontender, nondistended, positive bowel sounds, no masses no hepatosplenomegaly noted..  Neuro: No focal neurological deficits noted cranial nerves II through XII grossly intact. DTRs 2+ bilaterally upper and lower extremities. Strength 5 out of 5 in bilateral upper and lower extremities. Musculoskeletal: No warm swelling or erythema around joints, no spinal tenderness noted. Psychiatric: Patient alert and oriented x3, good insight and cognition, good recent to  remote recall. Lymph node survey: No cervical axillary or inguinal lymphadenopathy noted.  Lab Results:  Basic Metabolic Panel:    Component Value Date/Time   NA 139 06/12/2020 1023   NA 142 09/11/2017 1431   K 3.8 06/12/2020 1023   CL 108 06/12/2020 1023   CO2 23 06/12/2020 1023   BUN 12 06/12/2020 1023   BUN 8 09/11/2017 1431   CREATININE 0.58 06/12/2020 1023   GLUCOSE 123 (H) 06/12/2020 1023   CALCIUM 9.2 06/12/2020 1023   CBC:    Component Value Date/Time   WBC 11.2 (H) 06/14/2020 0529   HGB 10.1 (L) 06/14/2020 0529   HGB 13.0 09/11/2017 1431   HCT 30.1 (L) 06/14/2020 0529   HCT 38.4 09/11/2017 1431   PLT 224 06/14/2020 0529   PLT 258 09/11/2017 1431   MCV 68.9 (L) 06/14/2020 0529   MCV 75 (L) 09/11/2017 1431   NEUTROABS 8.1 (H) 06/12/2020 1023   NEUTROABS 3.9 09/11/2017 1431   LYMPHSABS 3.8 06/12/2020 1023   LYMPHSABS 2.2 09/11/2017 1431   MONOABS 1.0 06/12/2020 1023   EOSABS 0.5 06/12/2020 1023   EOSABS 0.1 09/11/2017 1431   BASOSABS 0.1 06/12/2020 1023   BASOSABS 0.0 09/11/2017 1431    Recent Results (from the past 240 hour(s))  SARS CORONAVIRUS 2 (TAT 6-24 HRS) Nasopharyngeal Nasopharyngeal Swab     Status: None   Collection Time: 06/13/20  3:36 PM   Specimen: Nasopharyngeal Swab  Result Value Ref Range Status   SARS Coronavirus 2 NEGATIVE NEGATIVE Final    Comment: (NOTE) SARS-CoV-2 target nucleic acids are NOT DETECTED.  The  SARS-CoV-2 RNA is generally detectable in upper and lower respiratory specimens during the acute phase of infection. Negative results do not preclude SARS-CoV-2 infection, do not rule out co-infections with other pathogens, and should not be used as the sole basis for treatment or other patient management decisions. Negative results must be combined with clinical observations, patient history, and epidemiological information. The expected result is Negative.  Fact Sheet for  Patients: HairSlick.no  Fact Sheet for Healthcare Providers: quierodirigir.com  This test is not yet approved or cleared by the [place] FDA and  has been authorized for detection and/or diagnosis of SARS-CoV-2 by FDA under an Emergency Use Authorization (EUA). This EUA will remain  in effect (meaning this test can be used) for the duration of the COVID-19 declaration under Se ction 564(b)(1) of the Act, 21 U.S.C. section 360bbb-3(b)(1), unless the authorization is terminated or revoked sooner.  Performed at [facility], [address], [place], [place] [address]     Studies/Results: No results found.  Medications: Scheduled Meds: . enoxaparin (LOVENOX) injection  40 mg Subcutaneous Q24H  . folic acid  1 mg Oral Daily  . HYDROmorphone   Intravenous Q4H  . ketorolac  15 mg Intravenous Q6H  . oxyCODONE  20 mg Oral Q12H  . senna-docusate  1 tablet Oral BID  . sodium chloride flush  10-40 mL Intracatheter Q12H  . valACYclovir  500 mg Oral Daily   Continuous Infusions: . sodium chloride 100 mL/hr at 06/14/20 0630   PRN Meds:.diphenhydrAMINE, naloxone **AND** sodium chloride flush, ondansetron (ZOFRAN) IV, oxyCODONE, polyethylene glycol, sodium chloride flush  Consultants:  None  Procedures:  None  Antibiotics:  None  Assessment/Plan: Principal Problem:   Sickle cell pain crisis (HCC) Active Problems:   Leukocytosis   Morbid obesity with BMI of 45.0-49.9, adult (HCC)   Chronic pain syndrome   Sickle cell disease with pain crisis: Reduce IV fluids to KVO Weaning IV Dilaudid PCA, settings decreased today Toradol 15 mg IV every 6 hours Restart home oxycodone 20 mg every 4 hours as needed for severe breakthrough pain Monitor vital signs very closely, reevaluate pain scale regularly, and supplemental oxygen as needed.  Leukocytosis: Stable.  Patient is afebrile without any signs of active  infection.  Continue to monitor closely without antibiotics.  Follow labs in AM.  Anemia of chronic disease: Hemoglobin remained stable and consistent with patient's baseline.  There is no clinical indication for blood transfusion at this time.  Continue to follow closely.  Morbid obesity: Patient counseled at length on the importance of following a low-fat, low carbohydrate diet divided over small meals throughout the day.  Also, recommend exercise as tolerated. Code Status: Full Code Family Communication: N/A Disposition Plan: Not yet ready for discharge  Zandyr Barnhill Rennis Petty  APRN, MSN, FNP-C [facility] [address] [place], [place] [address] [phone]  If 5PM-8AM, please contact night-coverage.  06/14/2020, 12:41 PM  LOS: 1 day

## 2020-06-15 DIAGNOSIS — D5700 Hb-SS disease with crisis, unspecified: Secondary | ICD-10-CM | POA: Diagnosis not present

## 2020-06-15 LAB — CBC
HCT: 30.1 % — ABNORMAL LOW (ref 36.0–46.0)
Hemoglobin: 10.2 g/dL — ABNORMAL LOW (ref 12.0–15.0)
MCH: 23.6 pg — ABNORMAL LOW (ref 26.0–34.0)
MCHC: 33.9 g/dL (ref 30.0–36.0)
MCV: 69.5 fL — ABNORMAL LOW (ref 80.0–100.0)
Platelets: 213 10*3/uL (ref 150–400)
RBC: 4.33 MIL/uL (ref 3.87–5.11)
RDW: 15.8 % — ABNORMAL HIGH (ref 11.5–15.5)
WBC: 10.7 10*3/uL — ABNORMAL HIGH (ref 4.0–10.5)
nRBC: 0.5 % — ABNORMAL HIGH (ref 0.0–0.2)

## 2020-06-15 NOTE — Plan of Care (Signed)
Pt report pain better managed with new Rx but difficulty sleeping; able to ambulate in room, good appetite, no s/s of acute distress reported or observed. Call light within reach and bed at lowest position for safety. LUA midline unable to properly flush, charge nurse aware.

## 2020-06-15 NOTE — Progress Notes (Signed)
Subjective:  Stephanie Burch is a 40 year old female with a medical history significant for sickle cell disease, chronic pain syndrome, opiate dependence and tolerance was admitted for sickle cell pain crisis: Patient continues to complain of pain to lower extremities.  She says that pain is somewhat improved from yesterday.  She is requesting 1 more day in the hospital and states that she cannot manage at home and current pain level.  Pain intensity is rated at 7/10 characterized as constant and throbbing. Patient denies any headache, chest pain, urinary symptoms, nausea, vomiting, or diarrhea. Objective:  Vital signs in last 24 hours:  Vitals:   06/15/20 1134 06/15/20 1442 06/15/20 1443 06/15/20 1536  BP:  (!) 161/77    Pulse:  (!) 110 (!) 106   Resp: 16 16  16   Temp:  98.6 F (37 C)    TempSrc:  Oral    SpO2: 98% 95% 97% 98%  Weight:        Intake/Output from previous day:   Intake/Output Summary (Last 24 hours) at 06/15/2020 1628 Last data filed at 06/15/2020 1202 Gross per 24 hour  Intake 1840.36 ml  Output 1550 ml  Net 290.36 ml    Physical Exam: General: Alert, awake, oriented x3, in no acute distress.  HEENT: [place]/AT PEERL, EOMI Neck: Trachea midline,  no masses, no thyromegal,y no JVD, no carotid bruit OROPHARYNX:  Moist, No exudate/ erythema/lesions.  Heart: Regular rate and rhythm, without murmurs, rubs, gallops, PMI non-displaced, no heaves or thrills on palpation.  Lungs: Clear to auscultation, no wheezing or rhonchi noted. No increased vocal fremitus resonant to percussion  Abdomen: Soft, nontender, nondistended, positive bowel sounds, no masses no hepatosplenomegaly noted..  Neuro: No focal neurological deficits noted cranial nerves II through XII grossly intact. DTRs 2+ bilaterally upper and lower extremities. Strength 5 out of 5 in bilateral upper and lower extremities. Musculoskeletal: No warm swelling or erythema around joints, no spinal tenderness  noted. Psychiatric: Patient alert and oriented x3, good insight and cognition, good recent to remote recall. Lymph node survey: No cervical axillary or inguinal lymphadenopathy noted.  Lab Results:  Basic Metabolic Panel:    Component Value Date/Time   NA 139 06/12/2020 1023   NA 142 09/11/2017 1431   K 3.8 06/12/2020 1023   CL 108 06/12/2020 1023   CO2 23 06/12/2020 1023   BUN 12 06/12/2020 1023   BUN 8 09/11/2017 1431   CREATININE 0.58 06/12/2020 1023   GLUCOSE 123 (H) 06/12/2020 1023   CALCIUM 9.2 06/12/2020 1023   CBC:    Component Value Date/Time   WBC 10.7 (H) 06/15/2020 0700   HGB 10.2 (L) 06/15/2020 0700   HGB 13.0 09/11/2017 1431   HCT 30.1 (L) 06/15/2020 0700   HCT 38.4 09/11/2017 1431   PLT 213 06/15/2020 0700   PLT 258 09/11/2017 1431   MCV 69.5 (L) 06/15/2020 0700   MCV 75 (L) 09/11/2017 1431   NEUTROABS 8.1 (H) 06/12/2020 1023   NEUTROABS 3.9 09/11/2017 1431   LYMPHSABS 3.8 06/12/2020 1023   LYMPHSABS 2.2 09/11/2017 1431   MONOABS 1.0 06/12/2020 1023   EOSABS 0.5 06/12/2020 1023   EOSABS 0.1 09/11/2017 1431   BASOSABS 0.1 06/12/2020 1023   BASOSABS 0.0 09/11/2017 1431    Recent Results (from the past 240 hour(s))  SARS CORONAVIRUS 2 (TAT 6-24 HRS) Nasopharyngeal Nasopharyngeal Swab     Status: None   Collection Time: 06/13/20  3:36 PM   Specimen: Nasopharyngeal Swab  Result Value Ref Range  Status   SARS Coronavirus 2 NEGATIVE NEGATIVE Final    Comment: (NOTE) SARS-CoV-2 target nucleic acids are NOT DETECTED.  The SARS-CoV-2 RNA is generally detectable in upper and lower respiratory specimens during the acute phase of infection. Negative results do not preclude SARS-CoV-2 infection, do not rule out co-infections with other pathogens, and should not be used as the sole basis for treatment or other patient management decisions. Negative results must be combined with clinical observations, patient history, and epidemiological information. The  expected result is Negative.  Fact Sheet for Patients: HairSlick.no  Fact Sheet for Healthcare Providers: quierodirigir.com  This test is not yet approved or cleared by the [place] FDA and  has been authorized for detection and/or diagnosis of SARS-CoV-2 by FDA under an Emergency Use Authorization (EUA). This EUA will remain  in effect (meaning this test can be used) for the duration of the COVID-19 declaration under Se ction 564(b)(1) of the Act, 21 U.S.C. section 360bbb-3(b)(1), unless the authorization is terminated or revoked sooner.  Performed at [facility], [address], [place], [place] [address]     Studies/Results: No results found.  Medications: Scheduled Meds: . enoxaparin (LOVENOX) injection  40 mg Subcutaneous Q24H  . folic acid  1 mg Oral Daily  . HYDROmorphone   Intravenous Q4H  . ketorolac  15 mg Intravenous Q6H  . oxyCODONE  20 mg Oral Q12H  . senna-docusate  1 tablet Oral BID  . sodium chloride flush  10-40 mL Intracatheter Q12H  . valACYclovir  500 mg Oral Daily   Continuous Infusions: . sodium chloride 10 mL/hr at 06/14/20 1759   PRN Meds:.diphenhydrAMINE, naloxone **AND** sodium chloride flush, ondansetron (ZOFRAN) IV, oxyCODONE, polyethylene glycol, sodium chloride flush  Consultants:  None  Procedures:  None  Antibiotics:  None  Assessment/Plan: Principal Problem:   Sickle cell pain crisis (HCC) Active Problems:   Leukocytosis   Morbid obesity with BMI of 45.0-49.9, adult (HCC)   Chronic pain syndrome   Sickle cell disease with pain crisis: Fluids to [facility] Continue to wean IV Dilaudid PCA Toradol 15 mg IV every 6 hours for total of 5 days Oxycodone 20 mg every 4 hours as needed for severe breakthrough pain Monitor vital signs very closely, reevaluate pain scale regularly, and supplemental oxygen as needed. Patient encouraged to ambulate in halls.  Possible  discharge on 06/16/2020  Leukocytosis: Stable.  Patient is afebrile without any signs of active infection.  Continue to follow closely without antibiotics.  CBC in AM.  Anemia of chronic disease: Hemoglobin is stable and consistent with patient's baseline.  No clinical indication for blood transfusion.  Continue to follow closely.  Morbid obesity: The patient is asked to make an attempt to improve diet and exercise patterns to aid in medical management of this problem.   Code Status: Full Code Family Communication: N/A Disposition Plan: Not yet ready for discharge  Rebekah Zackery Rennis Petty  APRN, MSN, FNP-C [facility] [address] [place], [place] [address] [phone]   If 7PM-7AM, please contact night-coverage.  06/15/2020, 4:28 PM  LOS: 2 days

## 2020-06-15 NOTE — Care Management Important Message (Signed)
Important Message  Patient Details IM Letter given to the Patient. Name: Huberta Tompkins MRN: [id number] Date of Birth: [birth year]   Medicare Important Message Given:  Yes     Caren Macadam 06/15/2020, 11:21 AM

## 2020-06-16 DIAGNOSIS — Z6841 Body Mass Index (BMI) 40.0 and over, adult: Secondary | ICD-10-CM

## 2020-06-16 DIAGNOSIS — D72829 Elevated white blood cell count, unspecified: Secondary | ICD-10-CM

## 2020-06-16 DIAGNOSIS — G894 Chronic pain syndrome: Secondary | ICD-10-CM

## 2020-06-16 DIAGNOSIS — D5700 Hb-SS disease with crisis, unspecified: Secondary | ICD-10-CM | POA: Diagnosis not present

## 2020-06-16 NOTE — Progress Notes (Signed)
Patient ID: Stephanie Burch, female   DOB: 1980-11-02, 40 y.o.   MRN: [id number] Subjective: Stephanie Burch is a 40 year old female with a medical history significant for sickle cell disease, chronic pain syndrome, opiate dependence and tolerance was admitted for sickle cell pain crisis.  Patient continues to complain of significant pain in her lower extremities.  She says her pain is up to 7/10 and she is not comfortable going home today.  She is requesting 1 more day in the hospital as she did yesterday.  She denies any fever, headache, cough, chest pain, urinary symptoms, nausea, vomiting or diarrhea.  Objective:  Vital signs in last 24 hours:  Vitals:   06/16/20 0506 06/16/20 0732 06/16/20 1235 06/16/20 1350  BP:    131/65  Pulse:    85  Resp: 20 16 14 15   Temp:    98.6 F (37 C)  TempSrc:    Oral  SpO2: 100% 100% 100% 100%  Weight: 128.9 kg       Intake/Output from previous day:   Intake/Output Summary (Last 24 hours) at 06/16/2020 1459 Last data filed at 06/16/2020 1353 Gross per 24 hour  Intake 1133.36 ml  Output 2550 ml  Net -1416.64 ml    Physical Exam: General: Alert, awake, oriented x3, in no acute distress.  Morbidly obese HEENT: [place]/AT PEERL, EOMI Neck: Trachea midline,  no masses, no thyromegal,y no JVD, no carotid bruit OROPHARYNX:  Moist, No exudate/ erythema/lesions.  Heart: Regular rate and rhythm, without murmurs, rubs, gallops, PMI non-displaced, no heaves or thrills on palpation.  Lungs: Clear to auscultation, no wheezing or rhonchi noted. No increased vocal fremitus resonant to percussion  Abdomen: Soft, nontender, nondistended, positive bowel sounds, no masses no hepatosplenomegaly noted..  Neuro: No focal neurological deficits noted cranial nerves II through XII grossly intact. DTRs 2+ bilaterally upper and lower extremities. Strength 5 out of 5 in bilateral upper and lower extremities. Musculoskeletal: No warm swelling or erythema around joints, no spinal  tenderness noted. Psychiatric: Patient alert and oriented x3, good insight and cognition, good recent to remote recall. Lymph node survey: No cervical axillary or inguinal lymphadenopathy noted.  Lab Results:  Basic Metabolic Panel:    Component Value Date/Time   NA 139 06/12/2020 1023   NA 142 09/11/2017 1431   K 3.8 06/12/2020 1023   CL 108 06/12/2020 1023   CO2 23 06/12/2020 1023   BUN 12 06/12/2020 1023   BUN 8 09/11/2017 1431   CREATININE 0.58 06/12/2020 1023   GLUCOSE 123 (H) 06/12/2020 1023   CALCIUM 9.2 06/12/2020 1023   CBC:    Component Value Date/Time   WBC 10.7 (H) 06/15/2020 0700   HGB 10.2 (L) 06/15/2020 0700   HGB 13.0 09/11/2017 1431   HCT 30.1 (L) 06/15/2020 0700   HCT 38.4 09/11/2017 1431   PLT 213 06/15/2020 0700   PLT 258 09/11/2017 1431   MCV 69.5 (L) 06/15/2020 0700   MCV 75 (L) 09/11/2017 1431   NEUTROABS 8.1 (H) 06/12/2020 1023   NEUTROABS 3.9 09/11/2017 1431   LYMPHSABS 3.8 06/12/2020 1023   LYMPHSABS 2.2 09/11/2017 1431   MONOABS 1.0 06/12/2020 1023   EOSABS 0.5 06/12/2020 1023   EOSABS 0.1 09/11/2017 1431   BASOSABS 0.1 06/12/2020 1023   BASOSABS 0.0 09/11/2017 1431    Recent Results (from the past 240 hour(s))  SARS CORONAVIRUS 2 (TAT 6-24 HRS) Nasopharyngeal Nasopharyngeal Swab     Status: None   Collection Time: 06/13/20  3:36 PM  Specimen: Nasopharyngeal Swab  Result Value Ref Range Status   SARS Coronavirus 2 NEGATIVE NEGATIVE Final    Comment: (NOTE) SARS-CoV-2 target nucleic acids are NOT DETECTED.  The SARS-CoV-2 RNA is generally detectable in upper and lower respiratory specimens during the acute phase of infection. Negative results do not preclude SARS-CoV-2 infection, do not rule out co-infections with other pathogens, and should not be used as the sole basis for treatment or other patient management decisions. Negative results must be combined with clinical observations, patient history, and epidemiological information.  The expected result is Negative.  Fact Sheet for Patients: HairSlick.no  Fact Sheet for Healthcare Providers: quierodirigir.com  This test is not yet approved or cleared by the [place] FDA and  has been authorized for detection and/or diagnosis of SARS-CoV-2 by FDA under an Emergency Use Authorization (EUA). This EUA will remain  in effect (meaning this test can be used) for the duration of the COVID-19 declaration under Se ction 564(b)(1) of the Act, 21 U.S.C. section 360bbb-3(b)(1), unless the authorization is terminated or revoked sooner.  Performed at [facility], [address] [place], [place] [address]     Studies/Results: No results found.  Medications: Scheduled Meds: . enoxaparin (LOVENOX) injection  40 mg Subcutaneous Q24H  . folic acid  1 mg Oral Daily  . HYDROmorphone   Intravenous Q4H  . ketorolac  15 mg Intravenous Q6H  . oxyCODONE  20 mg Oral Q12H  . senna-docusate  1 tablet Oral BID  . sodium chloride flush  10-40 mL Intracatheter Q12H  . valACYclovir  500 mg Oral Daily   Continuous Infusions: . sodium chloride 10 mL/hr at 06/14/20 1759   PRN Meds:.diphenhydrAMINE, naloxone **AND** sodium chloride flush, ondansetron (ZOFRAN) IV, oxyCODONE, polyethylene glycol, sodium chloride flush  Consultants:  None  Procedures:  None  Antibiotics:  None  Assessment/Plan: Principal Problem:   Sickle cell pain crisis (HCC) Active Problems:   Leukocytosis   Morbid obesity with BMI of 45.0-49.9, adult (HCC)   Chronic pain syndrome  1. Hb Sickle Cell Disease with crisis: Continue IVF at [facility], continue weight based Dilaudid PCA at current setting, continue IV Toradol 15 mg Q 6 H for total of 5 days, continue oxycodone 20 mg every 4 hours as needed for severe breakthrough pain.  Monitor vitals very closely, Re-evaluate pain scale regularly, 2 L of Oxygen by [place].  For possible discharge tomorrow  06/17/2020 2. Leukocytosis: Stable.  Patient is afebrile. There is no sign of infection or inflammation. We will continue to follow with no antibiotics. 3. 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  No clinical indication for blood transfusion today.  We will continue to monitor closely. 4. Chronic pain Syndrome: Patient appears to be misusing her home pain medications. She is has inconsistent story about how she uses her medications. She got 90 pills of oxycodone 20 mg on 06/07/2020 but she initially denies filling these prescription saying that the last prescription she filled was 30 pills in April but PMPM showed that she got 90 pills on the date stated above.  Patient was counseled extensively about possibility of misuse which may have consequences. 5. Morbid obesity: Patient was counseled extensively on improving diet and gentle exercise as tolerated.  Code Status: Full Code Family Communication: N/A Disposition Plan: Not yet ready for discharge  Jerrion Tabbert  If 7PM-7AM, please contact night-coverage.  06/16/2020, 2:59 PM  LOS: 3 days

## 2020-06-17 ENCOUNTER — Other Ambulatory Visit: Payer: Self-pay | Admitting: Internal Medicine

## 2020-06-17 DIAGNOSIS — D571 Sickle-cell disease without crisis: Secondary | ICD-10-CM

## 2020-06-17 DIAGNOSIS — G894 Chronic pain syndrome: Secondary | ICD-10-CM

## 2020-06-17 DIAGNOSIS — D5700 Hb-SS disease with crisis, unspecified: Secondary | ICD-10-CM | POA: Diagnosis not present

## 2020-06-17 DIAGNOSIS — D72829 Elevated white blood cell count, unspecified: Secondary | ICD-10-CM | POA: Diagnosis not present

## 2020-06-17 DIAGNOSIS — F1190 Opioid use, unspecified, uncomplicated: Secondary | ICD-10-CM

## 2020-06-17 LAB — CBC WITH DIFFERENTIAL/PLATELET
Abs Immature Granulocytes: 0.03 10*3/uL (ref 0.00–0.07)
Basophils Absolute: 0 10*3/uL (ref 0.0–0.1)
Basophils Relative: 0 %
Eosinophils Absolute: 0.7 10*3/uL — ABNORMAL HIGH (ref 0.0–0.5)
Eosinophils Relative: 7 %
HCT: 30.2 % — ABNORMAL LOW (ref 36.0–46.0)
Hemoglobin: 10 g/dL — ABNORMAL LOW (ref 12.0–15.0)
Immature Granulocytes: 0 %
Lymphocytes Relative: 32 %
Lymphs Abs: 3 10*3/uL (ref 0.7–4.0)
MCH: 23 pg — ABNORMAL LOW (ref 26.0–34.0)
MCHC: 33.1 g/dL (ref 30.0–36.0)
MCV: 69.6 fL — ABNORMAL LOW (ref 80.0–100.0)
Monocytes Absolute: 0.7 10*3/uL (ref 0.1–1.0)
Monocytes Relative: 7 %
Neutro Abs: 5.2 10*3/uL (ref 1.7–7.7)
Neutrophils Relative %: 54 %
Platelets: 195 10*3/uL (ref 150–400)
RBC: 4.34 MIL/uL (ref 3.87–5.11)
RDW: 15.6 % — ABNORMAL HIGH (ref 11.5–15.5)
WBC: 9.6 10*3/uL (ref 4.0–10.5)
nRBC: 0.4 % — ABNORMAL HIGH (ref 0.0–0.2)

## 2020-06-17 NOTE — Discharge Summary (Signed)
Physician Discharge Summary  Stephanie Burch QBH:[id number] DOB: Oct 22, 1980 DOA: 06/13/2020  PCP: Kallie Locks, FNP (Inactive)  Admit date: 06/13/2020  Discharge date: 06/17/2020  Discharge Diagnoses:  Principal Problem:   Sickle cell pain crisis (HCC) Active Problems:   Leukocytosis   Morbid obesity with BMI of 45.0-49.9, adult (HCC)   Chronic pain syndrome   Discharge Condition: Stable  Disposition:   Follow-up Information    Kallie Locks, FNP. Schedule an appointment as soon as possible for a visit in 2 day(s).   Specialty: Family Medicine Contact information: [address] [place] [address] [phone]              Pt is discharged home in good condition and is to follow up with Kallie Locks, FNP (Inactive) this week to have labs evaluated. Stephanie Burch is instructed to increase activity slowly and balance with rest for the next few days, and use prescribed medication to complete treatment of pain  Diet: Regular Wt Readings from Last 3 Encounters:  06/16/20 128.9 kg  05/19/20 127 kg  04/26/20 136.1 kg    History of present illness:  Stephanie Burch is a 40 year old female with a medical history significant for sickle cell disease, opiate dependence and tolerance, morbid obesity, history of depression and anxiety, and history of anemia of chronic disease presents with complaints of low back and bilateral lower extremity pain that is consistent with her typical pain crisis.  Patient was treated and evaluated in the day clinic on yesterday and did not have sustained relief.  She says that pain was not well controlled at discharge, patient was given the option of returning today for further pain managed and extended observation.  She says that she last had oxycodone and extensive this a.m. without sustained relief.  She attributes current cranial crisis to increased daily activities over the past several weeks.  She rates her pain as 10/10 characterized  as constant and throbbing.  She denies any headache, chest pain, shortness of breath, urinary symptoms, nausea, vomiting, or diarrhea.  No sick contacts, recent travel, or exposure to COVID-19.  Sickle cell center course:  Vital signs recorded as: BP (!) 144/96 (BP Location: Left Arm)   Pulse 98   Temp 99.1 F (37.3 C) (Oral)   Resp 17   LMP 05/04/2020   SpO2 96%   Complete blood count shows: WBCs 13.5, hemoglobin 12, and platelets 290,000.  Complete metabolic panel is totally unremarkable.  COVID-19 test pending.  Patient's pain persists despite IV Dilaudid PCA, IV fluids, and IV Toradol.  Patient admitted to MedSurg for further management of sickle cell pain crisis.  Hospital Course:  Patient was admitted for sickle cell pain crisis and managed appropriately with IVF, IV Dilaudid via PCA and IV Toradol, as well as other adjunct therapies per sickle cell pain management protocols.  Patient is highly opiate tolerant with tendency to misuse prescriptions as she consistently have inconsistent stories around the use of her prescription pills especially the short acting oxycodone 20 mg. Patient last filled her prescription of 90 pills of 20 mg oxycodone on 06/07/2020, she has been on admission for 5 days meaning that she is not due for refill for at least 9 days from today but patient claims she ran out of these medications before she got admitted.  Patient advised to resume soon her monthly IV infusion of Adakveo at the day hospital.  She slowly improved on above regimen, she remained hemodynamically stable throughout this admission.  Pain slowly returned to baseline.  Hemoglobin was stable at baseline throughout this admission, no indication for blood transfusion.  As at today, patient ambulating well with no significant pain, and tolerating p.o. intake with no restriction. Patient was therefore discharged home today in a hemodynamically stable condition.   Stephanie Burch will follow-up with PCP within 1 week of  this discharge. Stephanie Burch was counseled extensively about nonpharmacologic means of pain management, patient verbalized understanding and was appreciative of  the care received during this admission.   We discussed the need for good hydration, monitoring of hydration status, avoidance of heat, cold, stress, and infection triggers. We discussed the need to be adherent with taking Hydrea and other home medications. Patient was reminded of the need to seek medical attention immediately if any symptom of bleeding, anemia, or infection occurs.  Discharge Exam: Vitals:   06/17/20 0948 06/17/20 1146  BP: (!) 141/89   Pulse: 82   Resp: 15 14  Temp: 98 F (36.7 C)   SpO2: 100% 98%   Vitals:   06/17/20 0439 06/17/20 0712 06/17/20 0948 06/17/20 1146  BP: (!) 121/56  (!) 141/89   Pulse: 79  82   Resp: 14 18 15 14   Temp:   98 F (36.7 C)   TempSrc:   Oral   SpO2: 100% 100% 100% 98%  Weight:       General appearance : Awake, alert, not in any distress. Speech Clear. Not toxic looking, morbidly obese HEENT: Atraumatic and Normocephalic, pupils equally reactive to light and accomodation Neck: Supple, no JVD. No cervical lymphadenopathy.  Chest: Good air entry bilaterally, no added sounds  CVS: S1 S2 regular, no murmurs.  Abdomen: Bowel sounds present, Non tender and not distended with no gaurding, rigidity or rebound. Extremities: B/L Lower Ext shows no edema, both legs are warm to touch Neurology: Awake alert, and oriented X 3, CN II-XII intact, Non focal Skin: No Rash  Discharge Instructions  Discharge Instructions    Diet - low sodium heart healthy   Complete by: As directed    Increase activity slowly   Complete by: As directed      Allergies as of 06/17/2020   No Known Allergies     Medication List    TAKE these medications   folic acid 1 MG tablet Commonly known as: FOLVITE Take 1 tablet (1 mg total) by mouth daily.   ibuprofen 800 MG tablet Commonly known as: ADVIL TAKE 1  TABLET (800 MG TOTAL) BY MOUTH EVERY 8 (EIGHT) HOURS AS NEEDED FOR HEADACHE, MILD PAIN, MODERATE PAIN OR CRAMPING.   ondansetron 4 MG tablet Commonly known as: ZOFRAN Take 4 mg by mouth every 8 (eight) hours as needed for nausea or vomiting.   Oxycodone HCl 20 MG Tabs Take 1 tablet (20 mg total) by mouth every 4 (four) hours as needed (pain).   tiZANidine 4 MG capsule Commonly known as: ZANAFLEX Take 1 capsule (4 mg total) by mouth 3 (three) times daily as needed for muscle spasms.   valACYclovir 500 MG tablet Commonly known as: Valtrex Take Valtrex 500 mg daily for suppression of outbreaks. Take Valtrex 1000 mg daily X 1 week for outbreaks. What changed:   how much to take  how to take this  when to take this  additional instructions   Vitamin D (Ergocalciferol) 1.25 MG (50000 UNIT) Caps capsule Commonly known as: DRISDOL Take 1 capsule (50,000 Units total) by mouth every 7 (seven) days. What changed: additional instructions  Xtampza ER 18 MG C12a Generic drug: oxyCODONE ER Take 18 mg by mouth every 12 (twelve) hours.       The results of significant diagnostics from this hospitalization (including imaging, microbiology, ancillary and laboratory) are listed below for reference.    Significant Diagnostic Studies: No results found.  Microbiology: Recent Results (from the past 240 hour(s))  SARS CORONAVIRUS 2 (TAT 6-24 HRS) Nasopharyngeal Nasopharyngeal Swab     Status: None   Collection Time: 06/13/20  3:36 PM   Specimen: Nasopharyngeal Swab  Result Value Ref Range Status   SARS Coronavirus 2 NEGATIVE NEGATIVE Final    Comment: (NOTE) SARS-CoV-2 target nucleic acids are NOT DETECTED.  The SARS-CoV-2 RNA is generally detectable in upper and lower respiratory specimens during the acute phase of infection. Negative results do not preclude SARS-CoV-2 infection, do not rule out co-infections with other pathogens, and should not be used as the sole basis for  treatment or other patient management decisions. Negative results must be combined with clinical observations, patient history, and epidemiological information. The expected result is Negative.  Fact Sheet for Patients: HairSlick.no  Fact Sheet for Healthcare Providers: quierodirigir.com  This test is not yet approved or cleared by the [place] FDA and  has been authorized for detection and/or diagnosis of SARS-CoV-2 by FDA under an Emergency Use Authorization (EUA). This EUA will remain  in effect (meaning this test can be used) for the duration of the COVID-19 declaration under Se ction 564(b)(1) of the Act, 21 U.S.C. section 360bbb-3(b)(1), unless the authorization is terminated or revoked sooner.  Performed at [facility], [address], [place], [place] [address]      Labs: Basic Metabolic Panel: Recent Labs  Lab 06/12/20 1023  NA 139  K 3.8  CL 108  CO2 23  GLUCOSE 123*  BUN 12  CREATININE 0.58  CALCIUM 9.2   Liver Function Tests: Recent Labs  Lab 06/12/20 1023  AST 15  ALT 20  ALKPHOS 78  BILITOT 0.8  PROT 7.4  ALBUMIN 3.8   No results for input(s): LIPASE, AMYLASE in the last 168 hours. No results for input(s): AMMONIA in the last 168 hours. CBC: Recent Labs  Lab 06/12/20 1023 06/14/20 0529 06/15/20 0700 06/17/20 0536  WBC 13.5* 11.2* 10.7* 9.6  NEUTROABS 8.1*  --   --  5.2  HGB 12.0 10.1* 10.2* 10.0*  HCT 35.3* 30.1* 30.1* 30.2*  MCV 68.8* 68.9* 69.5* 69.6*  PLT 290 224 213 195   Cardiac Enzymes: No results for input(s): CKTOTAL, CKMB, CKMBINDEX, TROPONINI in the last 168 hours. BNP: Invalid input(s): POCBNP CBG: No results for input(s): GLUCAP in the last 168 hours.  Time coordinating discharge: 50 minutes  Signed:  Tiki Burch  [facility] 06/17/2020, 12:37 PM

## 2020-06-18 ENCOUNTER — Other Ambulatory Visit: Payer: Self-pay | Admitting: Internal Medicine

## 2020-06-18 ENCOUNTER — Telehealth: Payer: Self-pay

## 2020-06-18 NOTE — Telephone Encounter (Signed)
Pt asking for her refills  Xtampza Oxycodone

## 2020-06-18 NOTE — Telephone Encounter (Signed)
Transition Care Management Unsuccessful Follow-up Telephone Call  Date of discharge and from where:  06/17/2020 from [facility]  Attempts:  1st Attempt  Reason for unsuccessful TCM follow-up call:  Left voice message

## 2020-06-19 ENCOUNTER — Non-Acute Institutional Stay (HOSPITAL_COMMUNITY)
Admission: RE | Admit: 2020-06-19 | Discharge: 2020-06-19 | Disposition: A | Discharge status: Home / Self Care | Payer: Medicare Other | Source: Ambulatory Visit | Attending: Internal Medicine | Admitting: Internal Medicine

## 2020-06-19 ENCOUNTER — Other Ambulatory Visit: Payer: Self-pay

## 2020-06-19 ENCOUNTER — Other Ambulatory Visit: Payer: Self-pay | Admitting: Family Medicine

## 2020-06-19 DIAGNOSIS — F1190 Opioid use, unspecified, uncomplicated: Secondary | ICD-10-CM

## 2020-06-19 DIAGNOSIS — G894 Chronic pain syndrome: Secondary | ICD-10-CM

## 2020-06-19 DIAGNOSIS — D571 Sickle-cell disease without crisis: Secondary | ICD-10-CM

## 2020-06-19 DIAGNOSIS — D5700 Hb-SS disease with crisis, unspecified: Secondary | ICD-10-CM | POA: Insufficient documentation

## 2020-06-19 MED ORDER — OXYCODONE HCL 20 MG PO TABS: 20.0000 mg | ORAL_TABLET | ORAL | 0 refills | Status: DC | PRN

## 2020-06-19 MED ORDER — XTAMPZA ER 18 MG PO C12A: 18.0000 mg | EXTENDED_RELEASE_CAPSULE | Freq: Two times a day (BID) | ORAL | 0 refills | Status: DC

## 2020-06-19 MED ORDER — SODIUM CHLORIDE 0.9 % IV SOLN
680.0000 mg | Freq: Once | INTRAVENOUS | Status: AC
  Administered 2020-06-19: 680 mg via INTRAVENOUS
  Filled 2020-06-19: qty 68

## 2020-06-19 MED ORDER — SODIUM CHLORIDE 0.9 % IV SOLN
INTRAVENOUS | Status: DC | PRN
  Administered 2020-06-19: 250 mL via INTRAVENOUS

## 2020-06-19 NOTE — Progress Notes (Signed)
Reviewed PDMP substance reporting system prior to prescribing opiate medications. No inconsistencies noted.   Meds ordered this encounter  Medications  . Oxycodone HCl 20 MG TABS    Sig: Take 1 tablet (20 mg total) by mouth every 4 (four) hours as needed (pain).    Dispense:  90 tablet    Refill:  0    Medication not to be filled prior to 06/21/2020    Order Specific Question:   Supervising Provider    Answer:   Quentin Angst [[id number]]  . oxyCODONE ER (XTAMPZA ER) 18 MG C12A    Sig: Take 18 mg by mouth every 12 (twelve) hours.    Dispense:  60 capsule    Refill:  0    Please do not refill this medication prior to 06/21/2020. Thank you.    Order Specific Question:   Supervising Provider    Answer:   Quentin Angst [[id number]]    Nolon Nations  APRN, MSN, FNP-C Patient Care [facility] [address] [place], [place] [address] [phone]

## 2020-06-19 NOTE — Progress Notes (Signed)
  Note: Patient received Adakveo infusion via PIV as ordered by [place] Hollis FNP. Tolerated infusion well with no adverse reaction. Vital signs stable. Pt observed for 10 minutes after infusion completed, pt states she does not want to stay longer, and has tolerated previous infusions well. Pt declined AVS. VSS.  Alert, oriented and ambulatory at discharge.

## 2020-06-19 NOTE — Telephone Encounter (Signed)
error 

## 2020-06-19 NOTE — Discharge Instructions (Signed)
Crizanlizumab injection What is this medicine? CRIZANLIZUMAB (KRIZ an LIZ ue mab) is a selectin blocker. This medicine is used to reduce how often sickle cell crises happen. This medicine may be used for other purposes; ask your health care provider or pharmacist if you have questions. COMMON BRAND NAME(S): ADAKVEO What should I tell my health care provider before I take this medicine? They need to know if you have any of these conditions:  an unusual or allergic reaction to crizanlizumab, other medicines, foods, dyes or preservatives  pregnant or trying to get pregnant  breast-feeding How should I use this medicine? This medicine is for infusion into a vein. It is given by a healthcare professional in a hospital or clinic setting. Talk to your pediatrician regarding the use of this medicine in children. While this drug may be prescribed for children as young as 16 years for selected conditions, precautions do apply. Overdosage: If you think you have taken too much of this medicine contact a poison control center or emergency room at once. NOTE: This medicine is only for you. Do not share this medicine with others. What if I miss a dose? Keep appointments for follow-up doses. It is important not to miss your dose. Call your doctor or healthcare professional if you are unable to keep an appointment. What may interact with this medicine? Interactions are not expected. This list may not describe all possible interactions. Give your health care provider a list of all the medicines, herbs, non-prescription drugs, or dietary supplements you use. Also tell them if you smoke, drink alcohol, or use illegal drugs. Some items may interact with your medicine. What should I watch for while using this medicine? Your condition will be monitored carefully while you are receiving this medicine. What side effects may I notice from receiving this medicine? Side effects that you should report to your doctor or  health care professional as soon as possible:  allergic reactions like skin rash, itching or hives; swelling of the face, lips, or tongue  signs and symptoms of an infusion reaction such as fever; chills or shivering; nausea or vomiting; tiredness; dizziness; sweating; shortness of breath or wheezing Side effects that usually do not require medical attention (report these to your doctor or health care professional if they continue or are bothersome):  back pain  diarrhea  joint pain  stomach pain This list may not describe all possible side effects. Call your doctor for medical advice about side effects. You may report side effects to FDA at 1-800-FDA-1088. Where should I keep my medicine? This medicine is given in a hospital or clinic and will not be stored at home. NOTE: This sheet is a summary. It may not cover all possible information. If you have questions about this medicine, talk to your doctor, pharmacist, or health care provider.  2021 Elsevier/Gold Standard (2017-12-23 09:49:06)  

## 2020-06-19 NOTE — Telephone Encounter (Signed)
Transition Care Management Unsuccessful Follow-up Telephone Call  Date of discharge and from where:  06/17/2020 from [facility]  Attempts:  2nd Attempt  Reason for unsuccessful TCM follow-up call:  Left voice message

## 2020-06-20 ENCOUNTER — Other Ambulatory Visit: Payer: Self-pay

## 2020-06-20 ENCOUNTER — Emergency Department (HOSPITAL_COMMUNITY)
Admission: EM | Admit: 2020-06-20 | Discharge: 2020-06-20 | Disposition: A | Discharge status: Home / Self Care | Payer: Medicare Other | Attending: Emergency Medicine | Admitting: Emergency Medicine

## 2020-06-20 ENCOUNTER — Encounter (HOSPITAL_COMMUNITY): Payer: Self-pay

## 2020-06-20 DIAGNOSIS — D57219 Sickle-cell/Hb-C disease with crisis, unspecified: Secondary | ICD-10-CM | POA: Diagnosis not present

## 2020-06-20 DIAGNOSIS — M79661 Pain in right lower leg: Secondary | ICD-10-CM | POA: Diagnosis present

## 2020-06-20 DIAGNOSIS — Z8616 Personal history of COVID-19: Secondary | ICD-10-CM | POA: Insufficient documentation

## 2020-06-20 DIAGNOSIS — M79662 Pain in left lower leg: Secondary | ICD-10-CM | POA: Diagnosis not present

## 2020-06-20 DIAGNOSIS — D5700 Hb-SS disease with crisis, unspecified: Secondary | ICD-10-CM

## 2020-06-20 LAB — CBC WITH DIFFERENTIAL/PLATELET
Abs Immature Granulocytes: 0.03 10*3/uL (ref 0.00–0.07)
Basophils Absolute: 0 10*3/uL (ref 0.0–0.1)
Basophils Relative: 0 %
Eosinophils Absolute: 0.1 10*3/uL (ref 0.0–0.5)
Eosinophils Relative: 1 %
HCT: 34.7 % — ABNORMAL LOW (ref 36.0–46.0)
Hemoglobin: 11.7 g/dL — ABNORMAL LOW (ref 12.0–15.0)
Immature Granulocytes: 0 %
Lymphocytes Relative: 20 %
Lymphs Abs: 1.8 10*3/uL (ref 0.7–4.0)
MCH: 22.7 pg — ABNORMAL LOW (ref 26.0–34.0)
MCHC: 33.7 g/dL (ref 30.0–36.0)
MCV: 67.4 fL — ABNORMAL LOW (ref 80.0–100.0)
Monocytes Absolute: 0.6 10*3/uL (ref 0.1–1.0)
Monocytes Relative: 6 %
Neutro Abs: 6.6 10*3/uL (ref 1.7–7.7)
Neutrophils Relative %: 73 %
Platelets: 231 10*3/uL (ref 150–400)
RBC: 5.15 MIL/uL — ABNORMAL HIGH (ref 3.87–5.11)
RDW: 15.8 % — ABNORMAL HIGH (ref 11.5–15.5)
WBC: 9.2 10*3/uL (ref 4.0–10.5)
nRBC: 0 % (ref 0.0–0.2)

## 2020-06-20 LAB — RETICULOCYTES
Immature Retic Fract: 29.9 % — ABNORMAL HIGH (ref 2.3–15.9)
RBC.: 5.18 MIL/uL — ABNORMAL HIGH (ref 3.87–5.11)
Retic Count, Absolute: 178.2 10*3/uL (ref 19.0–186.0)
Retic Ct Pct: 3.4 % — ABNORMAL HIGH (ref 0.4–3.1)

## 2020-06-20 LAB — BASIC METABOLIC PANEL
Anion gap: 11 (ref 5–15)
BUN: 8 mg/dL (ref 6–20)
CO2: 21 mmol/L — ABNORMAL LOW (ref 22–32)
Calcium: 9.3 mg/dL (ref 8.9–10.3)
Chloride: 109 mmol/L (ref 98–111)
Creatinine, Ser: 0.47 mg/dL (ref 0.44–1.00)
GFR, Estimated: >60 mL/min (ref >60)
Glucose, Bld: 102 mg/dL — ABNORMAL HIGH (ref 70–99)
Potassium: 3.7 mmol/L (ref 3.5–5.1)
Sodium: 141 mmol/L (ref 135–145)

## 2020-06-20 LAB — I-STAT BETA HCG BLOOD, ED (MC, WL, AP ONLY): I-stat hCG, quantitative: <5 m[IU]/mL (ref <5)

## 2020-06-20 MED ORDER — DEXTROSE 5 % IV BOLUS
1000.0000 mL | Freq: Once | INTRAVENOUS | Status: AC
  Administered 2020-06-20: 1000 mL via INTRAVENOUS

## 2020-06-20 MED ORDER — HYDROMORPHONE HCL 2 MG/ML IJ SOLN
2.0000 mg | INTRAMUSCULAR | Status: AC
  Administered 2020-06-20: 2 mg via INTRAVENOUS
  Filled 2020-06-20: qty 1

## 2020-06-20 MED ORDER — HYDROMORPHONE HCL 2 MG/ML IJ SOLN
2.0000 mg | Freq: Once | INTRAMUSCULAR | Status: AC
  Administered 2020-06-20: 2 mg via INTRAVENOUS
  Filled 2020-06-20: qty 1

## 2020-06-20 MED ORDER — ONDANSETRON HCL 4 MG/2ML IJ SOLN
4.0000 mg | INTRAMUSCULAR | Status: DC | PRN
Start: 2020-06-20 — End: 2020-06-20
  Administered 2020-06-20: 4 mg via INTRAVENOUS
  Filled 2020-06-20: qty 2

## 2020-06-20 MED ORDER — DIPHENHYDRAMINE HCL 25 MG PO CAPS
25.0000 mg | ORAL_CAPSULE | ORAL | Status: DC | PRN
  Administered 2020-06-20: 25 mg via ORAL
  Filled 2020-06-20: qty 1

## 2020-06-20 MED ORDER — KETOROLAC TROMETHAMINE 30 MG/ML IJ SOLN
30.0000 mg | Freq: Once | INTRAMUSCULAR | Status: AC
  Administered 2020-06-20: 30 mg via INTRAVENOUS
  Filled 2020-06-20: qty 1

## 2020-06-20 NOTE — ED Triage Notes (Signed)
Pt arrived via walk in, c/o SCC. Pain to bilateral legs. States she ran out of home meds. Recently admitted for same.

## 2020-06-20 NOTE — ED Notes (Signed)
Patient ambulatory to restroom  ?

## 2020-06-20 NOTE — Discharge Instructions (Addendum)
Please follow-up with the sickle cell center for ongoing evaluation and management.  I am glad that your symptoms are feeling improved.  I know you are not pain-free, but please pick up your at home maintenance medications tomorrow morning and return to the ER or go to the sickle cell center should you develop any new or worsening symptoms.  You were given narcotic and or sedative medications while in the emergency department. Do not drive. Do not use machinery or power tools. Do not sign legal documents. Do not drink alcohol. Do not take sleeping pills. Do not supervise children by yourself. Do not participate in activities that require climbing or being in high places.  It was a pleasure meeting you!

## 2020-06-20 NOTE — ED Provider Notes (Signed)
[facility]-EMERGENCY DEPT Provider Note   CSN: [id number] Arrival date & time: 06/20/20  0759     History Chief Complaint  Patient presents with  . Sickle Cell Pain Crisis    Stephanie Burch is a 40 y.o. female with PMH of sickle cell disease, opiate dependence and tolerance, and anemia of chronic disease who presents the ED with complaints of bilateral lower extremity pain consistent with her prior sickle cell crises.  I reviewed patient's medical record and she was just admitted to the hospital on 06/13/2020 for same.  At time of discharge, it was noted that she had filled 90 pills of 20 mg oxycodone on 06/07/2020 and had already run out of her medications at time of her admission, 9 days from when she was due for a refill.  She is also noted to be on monthly IV infusion of Adakveo.  Patient states that shortly after discharge home from the hospital she developed recurrence of her bilateral leg pain.  She describes the pain as being 10 out of 10 concentrated around the knees bilaterally.  She reports that she has been followed by the sickle cell center for years, [name].  However, she states that [name] recently left and she has not yet reestablished with a new provider.  She states that there are other providers there, but she has not seen them.  She is out of her at home maintenance medications.  She denies any fevers, chills, chest pain, cough, recent injury, numbness weakness, inability to ambulate, overlying skin changes, extremity swelling or edema, or any other symptoms.  HPI     Past Medical History:  Diagnosis Date  . HSV infection   . Nausea   . Sickle cell anemia (HCC)   . Vitamin B12 deficiency 12/2018  . Vitamin D deficiency     Patient Active Problem List   Diagnosis Date Noted  . Sickle cell anemia with crisis (HCC) 06/12/2020  . Pulmonary edema 11/11/2019  . Hypoxia   . Poor venous access 11/09/2019  . HCAP  (healthcare-associated pneumonia) 11/07/2019  . Sepsis (HCC) 11/07/2019  . Acute kidney injury (HCC) 11/07/2019  . History of COVID-19 11/07/2019  . COVID-19 virus detected 10/22/2019  . Sore throat due to virus 10/22/2019  . Urinary frequency 04/20/2019  . Vaginal odor 04/20/2019  . Chest pain varying with breathing   . Fever and chills   . Vitamin D deficiency 11/15/2018  . Nausea 11/15/2018  . Abnormal pulse oximetry   . Hypokalemia 09/20/2018  . Medication management 09/14/2018  . Muscle spasms of both lower extremities 08/25/2018  . Subjective visual disturbance of right eye 08/25/2018  . 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 Chronic pain syndrome 03/16/2018  . Chronic, continuous use of opioids 03/16/2018  . Tachycardia with heart rate 100-120 beats per minute   . Sickle cell crisis (HCC) 10/19/2017  . Leukocytosis 10/19/2017  . Thrombocytopenia (HCC) 10/19/2017  . Morbid obesity with BMI of 45.0-49.9, adult (HCC) 10/19/2017  . Sickle cell pain crisis (HCC) 10/15/2017    History reviewed. No pertinent surgical history.   OB History    Gravida  3   Para      Term      Preterm      AB      Living  3     SAB      IAB      Ectopic      Multiple  Live Births              Family History  Problem Relation Age of Onset  . Hypertension Mother   . Sickle cell trait Mother   . Diabetes Father   . Sickle cell trait Father     Social History   Tobacco Use  . Smoking status: Never Smoker  . Smokeless tobacco: Never Used  Vaping Use  . Vaping Use: Never used  Substance Use Topics  . Alcohol use: Not Currently  . Drug use: Never    Home Medications Prior to Admission medications   Medication Sig Start Date End Date Taking? Authorizing Provider  folic acid (FOLVITE) 1 MG tablet Take 1 tablet (1 mg total) by mouth daily. 11/15/18  Yes Kallie Locks, FNP  ibuprofen (ADVIL) 800 MG tablet TAKE 1 TABLET (800 MG TOTAL) BY MOUTH  EVERY 8 (EIGHT) HOURS AS NEEDED FOR HEADACHE, MILD PAIN, MODERATE PAIN OR CRAMPING. 04/10/20  Yes Kallie Locks, FNP  oxyCODONE ER [facility] ER) 18 MG C12A Take 18 mg by mouth every 12 (twelve) hours. 06/21/20  Yes Massie Maroon, FNP  Oxycodone HCl 20 MG TABS Take 1 tablet (20 mg total) by mouth every 4 (four) hours as needed (pain). 06/19/20  Yes Massie Maroon, FNP  tiZANidine (ZANAFLEX) 4 MG capsule Take 1 capsule (4 mg total) by mouth 3 (three) times daily as needed for muscle spasms. 05/08/20 12/04/20 Yes Kallie Locks, FNP  valACYclovir (VALTREX) 500 MG tablet Take Valtrex 500 mg daily for suppression of outbreaks. Take Valtrex 1000 mg daily X 1 week for outbreaks. Patient taking differently: Take 500 mg by mouth daily. 10/20/19  Yes Kallie Locks, FNP  Vitamin D, Ergocalciferol, (DRISDOL) 1.25 MG (50000 UT) CAPS capsule Take 1 capsule (50,000 Units total) by mouth every 7 (seven) days. Patient taking differently: Take 50,000 Units by mouth every 7 (seven) days. Take on Sunday 12/21/18  Yes Kallie Locks, FNP    Allergies    Patient has no known allergies.  Review of Systems   Review of Systems  All other systems reviewed and are negative.   Physical Exam Updated Vital Signs BP (!) 145/87   Pulse 74   Temp 98.7 F (37.1 C) (Oral)   Resp 18   SpO2 100%   Physical Exam Vitals and nursing note reviewed. Exam conducted with a chaperone present.  Constitutional:      Comments: Tearful.  In pain.  HENT:     Head: Normocephalic and atraumatic.  Eyes:     General: No scleral icterus.    Conjunctiva/sclera: Conjunctivae normal.  Cardiovascular:     Rate and Rhythm: Normal rate.     Pulses: Normal pulses.  Pulmonary:     Effort: Pulmonary effort is normal. No respiratory distress.  Musculoskeletal:        General: Tenderness present. No swelling, deformity or signs of injury.     Cervical back: Normal range of motion.     Comments: Patient is able to demonstrate  good range of motion of bilateral legs.  Tenderness appreciated around knees bilaterally.  No redness or overlying skin changes.  She is stable to flex and extend against resistance.  She is ambulatory.  Peripheral pulses are intact and symmetric.  No extremity edema or swelling.  Sensation grossly intact throughout.  No evidence of injury.  Skin:    General: Skin is dry.  Neurological:     General: No focal deficit present.  Mental Status: She is alert and oriented to person, place, and time.     GCS: GCS eye subscore is 4. GCS verbal subscore is 5. GCS motor subscore is 6.  Psychiatric:        Mood and Affect: Mood normal.        Behavior: Behavior normal.        Thought Content: Thought content normal.     ED Results / Procedures / Treatments   Labs (all labs ordered are listed, but only abnormal results are displayed) Labs Reviewed  CBC WITH DIFFERENTIAL/PLATELET - Abnormal; Notable for the following components:      Result Value   RBC 5.15 (*)    Hemoglobin 11.7 (*)    HCT 34.7 (*)    MCV 67.4 (*)    MCH 22.7 (*)    RDW 15.8 (*)    All other components within normal limits  RETICULOCYTES - Abnormal; Notable for the following components:   Retic Ct Pct 3.4 (*)    RBC. 5.18 (*)    Immature Retic Fract 29.9 (*)    All other components within normal limits  BASIC METABOLIC PANEL - Abnormal; Notable for the following components:   CO2 21 (*)    Glucose, Bld 102 (*)    All other components within normal limits  I-STAT BETA HCG BLOOD, ED ([facility], [facility], [facility] ONLY)    EKG None  Radiology No results found.  Procedures Procedures   Medications Ordered in ED Medications  diphenhydrAMINE (BENADRYL) capsule 25 mg (25 mg Oral Given 06/20/20 1100)  ondansetron (ZOFRAN) injection 4 mg (4 mg Intravenous Given 06/20/20 1101)  HYDROmorphone (DILAUDID) injection 2 mg (2 mg Intravenous Given 06/20/20 1104)  HYDROmorphone (DILAUDID) injection 2 mg (2 mg Intravenous Given 06/20/20 1142)   dextrose 5 % bolus 1,000 mL (0 mLs Intravenous Stopped 06/20/20 1225)  HYDROmorphone (DILAUDID) injection 2 mg (2 mg Intravenous Given 06/20/20 1230)  ketorolac (TORADOL) 30 MG/ML injection 30 mg (30 mg Intravenous Given 06/20/20 1231)    ED Course  I have reviewed the triage vital signs and the nursing notes.  Pertinent labs & imaging results that were available during my care of the patient were reviewed by me and considered in my medical decision making (see chart for details).    MDM Rules/Calculators/A&P                          Skyeler Scalese was evaluated in Emergency Department on 06/20/2020 for the symptoms described in the history of present illness. She was evaluated in the context of the global COVID-19 pandemic, which necessitated consideration that the patient might be at risk for infection with the SARS-CoV-2 virus that causes COVID-19. Institutional protocols and algorithms that pertain to the evaluation of patients at risk for COVID-19 are in a state of rapid change based on information released by regulatory bodies including the CDC and federal and state organizations. These policies and algorithms were followed during the patient's care in the ED.  I personally reviewed patient's medical chart and all notes from triage and staff during today's encounter. I have also ordered and reviewed all labs and imaging that I felt to be medically necessary in the evaluation of this patient's complaints and with consideration of their physical exam. If needed, translation services were available and utilized.   Patient with bilateral lower extremity swelling, around the knees, consistent with her history of sickle cell crises.  Will obtain laboratory  work-up.  She is not septic or ill-appearing.  Denies any fevers at home.  Denies any chest pain or shortness of breath.  No extremity swelling or edema concerning for DVT.  She is ambulatory.  Good range of motion.  Doubt septic joint or  inflammatory arthritis.  No obvious joint effusions.  Patient is opiate tolerant.  Will initiate Dilaudid 2 mg IV x2 and then reassess.  We will also treat with Toradol, Zofran, and dextrose bolus.  Patient's laboratory work-up is all reviewed and reassuring.  Elevated reticulocyte count suggestive of sickle cell crisis.  Her anemia has improved compared to priors.  No electrolyte derangement or impaired renal function.  She is not pregnant.  After treatment with 2 doses of IV Dilaudid, patient has improved from 10 out of 10 to 8 out of 10.  She is hoping that she will feel improved after third and final dose.  She would prefer to go home.  She states that her only concern is that she does not have any maintenance medications at home as her sickle cell center told her that they would not be available until tomorrow.  On final evaluation, patient feels improved at 6-7 out of 10.  She is still not entirely pain-free, but feels as though she can be discharged home.  She suspects that she will be able to manage until she can fill her prescription maintenance medications tomorrow morning.  She understands the need to follow-up with the sickle cell center for ongoing evaluation and management of her pain symptoms.  She knows that she cannot drive after being given narcotic medications.  ER return precautions discussed.  Patient voices understanding is agreeable to the plan.   Final Clinical Impression(s) / ED Diagnoses Final diagnoses:  Sickle cell pain crisis [facility](HCC)    Rx / DC Orders ED Discharge Orders    None       Lorelee NewGreen, Aeriel Boulay L, PA-C 06/20/20 1405    Linwood DibblesKnapp, Jon, MD 06/21/20 [phone]

## 2020-06-20 NOTE — Telephone Encounter (Signed)
Transition Care Management Unsuccessful Follow-up Telephone Call  Date of discharge and from where:  06/17/2020 from [facility]  Attempts:  3rd Attempt  Reason for unsuccessful TCM follow-up call:  Unable to reach patient

## 2020-06-20 NOTE — ED Notes (Signed)
Attempt x 2 for IV.

## 2020-06-21 ENCOUNTER — Telehealth: Payer: Self-pay | Admitting: *Deleted

## 2020-06-21 NOTE — Telephone Encounter (Signed)
Transition Care Management Follow-up Telephone Call  Date of discharge and from where: 06/20/2020 - [facility]  How have you been since you were released from the hospital? "Still in a lot of pain"  Any questions or concerns? No  Items Reviewed:  Did the pt receive and understand the discharge instructions provided? Yes   Medications obtained and verified? Yes   Other? No   Any new allergies since your discharge? No   Dietary orders reviewed? No  Do you have support at home? Yes    Functional Questionnaire: (I = Independent and D = Dependent) ADLs: I  Bathing/Dressing- I  Meal Prep- I  Eating- I  Maintaining continence- I  Transferring/Ambulation- I  Managing Meds- I  Follow up appointments reviewed:   PCP Hospital f/u appt confirmed? No  - per pt she will call and make her appointment  Specialist Hospital f/u appt confirmed? No    Are transportation arrangements needed? No   If their condition worsens, is the pt aware to call PCP or go to the Emergency Dept.? Yes  Was the patient provided with contact information for the PCP's office or ED? Yes  Was to pt encouraged to call back with questions or concerns? Yes

## 2020-07-03 ENCOUNTER — Other Ambulatory Visit: Payer: Self-pay

## 2020-07-03 ENCOUNTER — Other Ambulatory Visit: Payer: Self-pay | Admitting: Internal Medicine

## 2020-07-03 DIAGNOSIS — F1190 Opioid use, unspecified, uncomplicated: Secondary | ICD-10-CM

## 2020-07-03 DIAGNOSIS — G894 Chronic pain syndrome: Secondary | ICD-10-CM

## 2020-07-03 DIAGNOSIS — D571 Sickle-cell disease without crisis: Secondary | ICD-10-CM

## 2020-07-03 MED ORDER — OXYCODONE HCL 20 MG PO TABS: 20.0000 mg | ORAL_TABLET | ORAL | 0 refills | Status: DC | PRN

## 2020-07-04 ENCOUNTER — Other Ambulatory Visit: Payer: Self-pay | Admitting: Internal Medicine

## 2020-07-15 ENCOUNTER — Other Ambulatory Visit: Payer: Self-pay

## 2020-07-15 ENCOUNTER — Emergency Department (HOSPITAL_COMMUNITY)
Admission: EM | Admit: 2020-07-15 | Discharge: 2020-07-15 | Disposition: A | Discharge status: Home / Self Care | Payer: Medicare Other | Source: Home / Self Care | Attending: Emergency Medicine | Admitting: Emergency Medicine

## 2020-07-15 ENCOUNTER — Encounter (HOSPITAL_COMMUNITY): Payer: Self-pay | Admitting: Emergency Medicine

## 2020-07-15 ENCOUNTER — Emergency Department (HOSPITAL_COMMUNITY): Payer: Medicare Other

## 2020-07-15 DIAGNOSIS — Z79891 Long term (current) use of opiate analgesic: Secondary | ICD-10-CM | POA: Insufficient documentation

## 2020-07-15 DIAGNOSIS — G894 Chronic pain syndrome: Secondary | ICD-10-CM | POA: Insufficient documentation

## 2020-07-15 DIAGNOSIS — F1190 Opioid use, unspecified, uncomplicated: Secondary | ICD-10-CM

## 2020-07-15 DIAGNOSIS — D5700 Hb-SS disease with crisis, unspecified: Secondary | ICD-10-CM | POA: Insufficient documentation

## 2020-07-15 DIAGNOSIS — D571 Sickle-cell disease without crisis: Secondary | ICD-10-CM

## 2020-07-15 DIAGNOSIS — Z8616 Personal history of COVID-19: Secondary | ICD-10-CM | POA: Insufficient documentation

## 2020-07-15 DIAGNOSIS — N898 Other specified noninflammatory disorders of vagina: Secondary | ICD-10-CM | POA: Insufficient documentation

## 2020-07-15 LAB — COMPREHENSIVE METABOLIC PANEL
ALT: 14 U/L (ref 0–44)
AST: 14 U/L — ABNORMAL LOW (ref 15–41)
Albumin: 3.9 g/dL (ref 3.5–5.0)
Alkaline Phosphatase: 79 U/L (ref 38–126)
Anion gap: 9 (ref 5–15)
BUN: 9 mg/dL (ref 6–20)
CO2: 21 mmol/L — ABNORMAL LOW (ref 22–32)
Calcium: 9.3 mg/dL (ref 8.9–10.3)
Chloride: 108 mmol/L (ref 98–111)
Creatinine, Ser: 0.66 mg/dL (ref 0.44–1.00)
GFR, Estimated: >60 mL/min (ref >60)
Glucose, Bld: 129 mg/dL — ABNORMAL HIGH (ref 70–99)
Potassium: 3.9 mmol/L (ref 3.5–5.1)
Sodium: 138 mmol/L (ref 135–145)
Total Bilirubin: 0.8 mg/dL (ref 0.3–1.2)
Total Protein: 8.1 g/dL (ref 6.5–8.1)

## 2020-07-15 LAB — CBC WITH DIFFERENTIAL/PLATELET
Abs Immature Granulocytes: 0.04 10*3/uL (ref 0.00–0.07)
Basophils Absolute: 0 10*3/uL (ref 0.0–0.1)
Basophils Relative: 0 %
Eosinophils Absolute: 0.1 10*3/uL (ref 0.0–0.5)
Eosinophils Relative: 1 %
HCT: 35.4 % — ABNORMAL LOW (ref 36.0–46.0)
Hemoglobin: 12.2 g/dL (ref 12.0–15.0)
Immature Granulocytes: 0 %
Lymphocytes Relative: 18 %
Lymphs Abs: 1.9 10*3/uL (ref 0.7–4.0)
MCH: 23.2 pg — ABNORMAL LOW (ref 26.0–34.0)
MCHC: 34.5 g/dL (ref 30.0–36.0)
MCV: 67.3 fL — ABNORMAL LOW (ref 80.0–100.0)
Monocytes Absolute: 0.7 10*3/uL (ref 0.1–1.0)
Monocytes Relative: 7 %
Neutro Abs: 7.6 10*3/uL (ref 1.7–7.7)
Neutrophils Relative %: 74 %
Platelets: 320 10*3/uL (ref 150–400)
RBC: 5.26 MIL/uL — ABNORMAL HIGH (ref 3.87–5.11)
RDW: 16.7 % — ABNORMAL HIGH (ref 11.5–15.5)
WBC: 10.4 10*3/uL (ref 4.0–10.5)
nRBC: 0 % (ref 0.0–0.2)

## 2020-07-15 LAB — I-STAT BETA HCG BLOOD, ED (MC, WL, AP ONLY): I-stat hCG, quantitative: <5 m[IU]/mL (ref <5)

## 2020-07-15 LAB — RETICULOCYTES
Immature Retic Fract: 30.9 % — ABNORMAL HIGH (ref 2.3–15.9)
RBC.: 5.32 MIL/uL — ABNORMAL HIGH (ref 3.87–5.11)
Retic Count, Absolute: 175.6 10*3/uL (ref 19.0–186.0)
Retic Ct Pct: 3.3 % — ABNORMAL HIGH (ref 0.4–3.1)

## 2020-07-15 MED ORDER — HYDROMORPHONE HCL 2 MG/ML IJ SOLN
2.0000 mg | Freq: Once | INTRAMUSCULAR | Status: AC
Start: 2020-07-15 — End: 2020-07-15
  Administered 2020-07-15: 2 mg via INTRAVENOUS
  Filled 2020-07-15: qty 1

## 2020-07-15 MED ORDER — DIPHENHYDRAMINE HCL 50 MG/ML IJ SOLN
25.0000 mg | Freq: Once | INTRAMUSCULAR | Status: AC
  Administered 2020-07-15: 25 mg via INTRAVENOUS
  Filled 2020-07-15: qty 1

## 2020-07-15 MED ORDER — KETOROLAC TROMETHAMINE 15 MG/ML IJ SOLN
15.0000 mg | INTRAMUSCULAR | Status: AC
  Administered 2020-07-15: 15 mg via INTRAVENOUS
  Filled 2020-07-15: qty 1

## 2020-07-15 MED ORDER — HYDROMORPHONE HCL 2 MG/ML IJ SOLN
2.0000 mg | INTRAMUSCULAR | Status: AC
  Administered 2020-07-15: 2 mg via INTRAVENOUS
  Filled 2020-07-15: qty 1

## 2020-07-15 MED ORDER — HYDROMORPHONE HCL 2 MG/ML IJ SOLN
2.0000 mg | INTRAMUSCULAR | Status: AC
Start: 2020-07-15 — End: 2020-07-15
  Administered 2020-07-15: 2 mg via INTRAVENOUS
  Filled 2020-07-15: qty 1

## 2020-07-15 MED ORDER — OXYCODONE HCL 20 MG PO TABS: 20.0000 mg | ORAL_TABLET | ORAL | 0 refills | Status: DC | PRN

## 2020-07-15 NOTE — Discharge Instructions (Addendum)
As discussed, please go to the day hospital tomorrow.  I provided several pills of oxycodone to hold you over until then.  Return to the ER for any new or worsening symptoms.

## 2020-07-15 NOTE — ED Notes (Signed)
Pt ambulatory to restroom

## 2020-07-15 NOTE — ED Triage Notes (Signed)
Patient c/o sickle cell pain to bilateral legs x2 days.

## 2020-07-15 NOTE — ED Provider Notes (Signed)
[facility]-EMERGENCY DEPT Provider Note   CSN: [id number] Arrival date & time: 07/15/20  0813     History Chief Complaint  Patient presents with   Sickle Cell Pain Crisis    Stephanie Burch is a 40 y.o. female.  HPI 40 year old female with a history of sickle cell anemia, HSV infection presents to the ER with complaints of sickle cell crisis.  Complains of bilateral leg pain which has been ongoing for last several days, unrelieved by her home oxycodone.  She denies any leg swelling, no chest pain, shortness of breath, cough, numbness, weakness, inability to ambulate, overlying skin changes, extremity swelling or edema, no fevers.    Past Medical History:  Diagnosis Date   HSV infection    Nausea    Sickle cell anemia (HCC)    Vitamin B12 deficiency 12/2018   Vitamin D deficiency     Patient Active Problem List   Diagnosis Date Noted   Sickle cell anemia with crisis (HCC) 06/12/2020   Pulmonary edema 11/11/2019   Hypoxia    Poor venous access 11/09/2019   HCAP (healthcare-associated pneumonia) 11/07/2019   Sepsis (HCC) 11/07/2019   Acute kidney injury (HCC) 11/07/2019   History of COVID-19 11/07/2019   COVID-19 virus detected 10/22/2019   Sore throat due to virus 10/22/2019   Urinary frequency 04/20/2019   Vaginal odor 04/20/2019   Chest pain varying with breathing    Fever and chills    Vitamin D deficiency 11/15/2018   Nausea 11/15/2018   Abnormal pulse oximetry    Hypokalemia 09/20/2018   Medication management 09/14/2018   Muscle spasms of both lower extremities 08/25/2018   Subjective visual disturbance of right eye 08/25/2018   Hb-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 No pertinent surgical history.   OB History     Gravida  3   Para      Term      Preterm      AB      Living  3      SAB      IAB      Ectopic      Multiple      Live Births              Family History  Problem Relation Age of Onset   Hypertension Mother    Sickle cell trait Mother    Diabetes Father    Sickle cell trait Father     Social History   Tobacco Use   Smoking status: Never   Smokeless tobacco: Never  Vaping Use   Vaping Use: Never used  Substance Use Topics   Alcohol use: Not Currently   Drug use: Never    Home Medications Prior to Admission medications   Medication Sig Start Date End Date Taking? Authorizing Provider  folic acid (FOLVITE) 1 MG tablet Take 1 tablet (1 mg total) by mouth daily. 11/15/18  Yes Kallie Locks, FNP  ibuprofen (ADVIL) 800 MG tablet TAKE 1 TABLET (800 MG TOTAL) BY MOUTH EVERY 8 (EIGHT) HOURS AS NEEDED FOR HEADACHE, MILD PAIN, MODERATE PAIN OR CRAMPING.  04/10/20  Yes Kallie Locks, FNP  ondansetron (ZOFRAN) 4 MG tablet Take 4 mg by mouth every 8 (eight) hours as needed for nausea/vomiting. 06/21/20  Yes [provider]  oxyCODONE ER (XTAMPZA ER) 18 MG C12A Take 18 mg by mouth every 12 (twelve) hours. 06/21/20  Yes Massie Maroon, FNP  tiZANidine (ZANAFLEX) 4 MG capsule Take 1 capsule (4 mg total) by mouth 3 (three) times daily as needed for muscle spasms. 05/08/20 12/04/20 Yes Kallie Locks, FNP  valACYclovir (VALTREX) 500 MG tablet Take Valtrex 500 mg daily for suppression of outbreaks. Take Valtrex 1000 mg daily X 1 week for outbreaks. Patient taking differently: Take 500 mg by mouth daily. 10/20/19  Yes Kallie Locks, FNP  Vitamin D, Ergocalciferol, (DRISDOL) 1.25 MG (50000 UT) CAPS capsule Take 1 capsule (50,000 Units total) by mouth every 7 (seven) days. Patient taking differently: Take 50,000  Units by mouth every 7 (seven) days. Take on Sunday 12/21/18  Yes Kallie Locks, FNP  Oxycodone HCl 20 MG TABS Take 1 tablet (20 mg total) by mouth every 4 (four) hours as needed for up to 3 doses (pain). 07/15/20   Mare Ferrari, PA-C    Allergies    Patient has no known allergies.  Review of Systems   Review of Systems  Constitutional:  Negative for fever.  Respiratory:  Negative for shortness of breath.   Cardiovascular:  Negative for chest pain.  Musculoskeletal:  Positive for arthralgias.   Physical Exam Updated Vital Signs BP (!) 167/127   Pulse 99   Temp 98.6 F (37 C) (Oral)   Resp 16   LMP 06/17/2020   SpO2 98%   Physical Exam Vitals and nursing note reviewed.  Constitutional:      General: She is not in acute distress.    Appearance: She is well-developed.     Comments: Uncomfortable appearing  HENT:     Head: Normocephalic and atraumatic.  Eyes:     Conjunctiva/sclera: Conjunctivae normal.  Cardiovascular:     Rate and Rhythm: Normal rate and regular rhythm.     Heart sounds: No murmur heard. Pulmonary:     Effort: Pulmonary effort is normal. No respiratory distress.     Breath sounds: Normal breath sounds.  Abdominal:     Palpations: Abdomen is soft.     Tenderness: There is no abdominal tenderness.  Musculoskeletal:     Cervical back: Neck supple.     Right lower leg: No edema.     Left lower leg: No edema.     Comments: No significant lower extremity edema, overlying skin changes, erythema, 2+ DP pulses  Skin:    General: Skin is warm and dry.  Neurological:     General: No focal deficit present.     Mental Status: She is alert and oriented to person, place, and time.    ED Results / Procedures / Treatments   Labs (all labs ordered are listed, but only abnormal results are displayed) Labs Reviewed  COMPREHENSIVE METABOLIC PANEL - Abnormal; Notable for the following components:      Result Value   CO2 21 (*)    Glucose, Bld 129 (*)     AST 14 (*)    All other components within normal limits  CBC WITH DIFFERENTIAL/PLATELET - Abnormal; Notable for the following components:   RBC 5.26 (*)    HCT 35.4 (*)    MCV 67.3 (*)    MCH 23.2 (*)  RDW 16.7 (*)    All other components within normal limits  RETICULOCYTES - Abnormal; Notable for the following components:   Retic Ct Pct 3.3 (*)    RBC. 5.32 (*)    Immature Retic Fract 30.9 (*)    All other components within normal limits  I-STAT BETA HCG BLOOD, ED ([facility], [facility], [facility] ONLY)    EKG None  Radiology DG Chest Portable 1 View  Result Date: 07/15/2020 CLINICAL DATA:  Bilateral leg pain for the past 2 days. Sickle cell disease. EXAM: PORTABLE CHEST 1 VIEW COMPARISON:  05/18/2020 FINDINGS: Stable enlarged cardiac silhouette. Clear lungs with normal vascularity. Unremarkable bones. IMPRESSION: No acute abnormality.  Stable mild cardiomegaly Electronically Signed   By: Beckie Salts M.D.   On: 07/15/2020 10:30    Procedures Procedures   Medications Ordered in ED Medications  ketorolac (TORADOL) 15 MG/ML injection 15 mg (15 mg Intravenous Given 07/15/20 0916)  HYDROmorphone (DILAUDID) injection 2 mg (2 mg Intravenous Given 07/15/20 0917)  HYDROmorphone (DILAUDID) injection 2 mg (2 mg Intravenous Given 07/15/20 1055)  diphenhydrAMINE (BENADRYL) injection 25 mg (25 mg Intravenous Given 07/15/20 0917)  HYDROmorphone (DILAUDID) injection 2 mg (2 mg Intravenous Given 07/15/20 1144)    ED Course  I have reviewed the triage vital signs and the nursing notes.  Pertinent labs & imaging results that were available during my care of the patient were reviewed by me and considered in my medical decision making (see chart for details).     MDM Rules/Calculators/A&P                          Patient presenting with sickle cell crisis. Patient with typical symptoms and pain. Pt without CP, abdominal pain, or SOB. Pt is not exhibiting signs or symptoms of acute chest syndrome, organ failure,or  DVT. Pt is afebrile, hemodynamically stable. WBC wnl. Retic appropriately elevated. Hgb without change from baseline. Patient treated with sickle cell protocol  However without significant improvement in symptoms. Sickle cell clinic consulted for admission for further management.   Spoke with Dr. Hyman Hopes who reports that there are no beds on the sickle cell unit, patient is well-known to Dr. Hyman Hopes with reported known medication abuse. She does not appear to be in life threatening sickle cell crisis per labs. He requests the patient arrive to day hospital tomorrow. Will give her several pills of oxycdone as per Dr. Louis Meckel request.   Patient informed, she voiced understanding and is agreeable. Stable for discharge.   Final Clinical Impression(s) / ED Diagnoses Final diagnoses:  Sickle cell pain crisis (HCC)  Hb-SS disease without crisis (HCC)  Chronic pain syndrome  Chronic, continuous use of opioids    Rx / DC Orders ED Discharge Orders          Ordered    Oxycodone HCl 20 MG TABS  Every 4 hours PRN        07/15/20 1217             Leone Brand 07/15/20 1218    Vanetta Mulders, MD 07/18/20 1625

## 2020-07-16 ENCOUNTER — Inpatient Hospital Stay (HOSPITAL_COMMUNITY)
Admission: AD | Admit: 2020-07-16 | Discharge: 2020-07-18 | DRG: 812 | Disposition: A | Discharge status: Home / Self Care | Payer: Medicare Other | Source: Ambulatory Visit | Attending: Internal Medicine | Admitting: Internal Medicine

## 2020-07-16 ENCOUNTER — Encounter (HOSPITAL_COMMUNITY): Payer: Self-pay | Admitting: Family Medicine

## 2020-07-16 ENCOUNTER — Telehealth (HOSPITAL_COMMUNITY): Payer: Self-pay | Admitting: General Practice

## 2020-07-16 DIAGNOSIS — Z8616 Personal history of COVID-19: Secondary | ICD-10-CM

## 2020-07-16 DIAGNOSIS — Z6841 Body Mass Index (BMI) 40.0 and over, adult: Secondary | ICD-10-CM

## 2020-07-16 DIAGNOSIS — E538 Deficiency of other specified B group vitamins: Secondary | ICD-10-CM | POA: Diagnosis present

## 2020-07-16 DIAGNOSIS — Z833 Family history of diabetes mellitus: Secondary | ICD-10-CM | POA: Diagnosis not present

## 2020-07-16 DIAGNOSIS — D5700 Hb-SS disease with crisis, unspecified: Secondary | ICD-10-CM | POA: Diagnosis present

## 2020-07-16 DIAGNOSIS — E559 Vitamin D deficiency, unspecified: Secondary | ICD-10-CM | POA: Diagnosis present

## 2020-07-16 DIAGNOSIS — D638 Anemia in other chronic diseases classified elsewhere: Secondary | ICD-10-CM | POA: Diagnosis present

## 2020-07-16 DIAGNOSIS — Z832 Family history of diseases of the blood and blood-forming organs and certain disorders involving the immune mechanism: Secondary | ICD-10-CM | POA: Diagnosis not present

## 2020-07-16 DIAGNOSIS — G894 Chronic pain syndrome: Secondary | ICD-10-CM | POA: Diagnosis present

## 2020-07-16 DIAGNOSIS — Z79891 Long term (current) use of opiate analgesic: Secondary | ICD-10-CM | POA: Diagnosis not present

## 2020-07-16 DIAGNOSIS — Z79899 Other long term (current) drug therapy: Secondary | ICD-10-CM

## 2020-07-16 MED ORDER — POLYETHYLENE GLYCOL 3350 17 G PO PACK: 17.0000 g | PACK | Freq: Every day | ORAL | Status: DC | PRN

## 2020-07-16 MED ORDER — SODIUM CHLORIDE 0.45 % IV SOLN: INTRAVENOUS | Status: DC

## 2020-07-16 MED ORDER — SENNOSIDES-DOCUSATE SODIUM 8.6-50 MG PO TABS
1.0000 | ORAL_TABLET | Freq: Two times a day (BID) | ORAL | Status: DC
  Administered 2020-07-17 – 2020-07-18 (×3): 1 via ORAL
  Filled 2020-07-16 (×4): qty 1

## 2020-07-16 MED ORDER — OXYCODONE HCL ER 20 MG PO T12A
20.0000 mg | EXTENDED_RELEASE_TABLET | Freq: Two times a day (BID) | ORAL | Status: DC
  Administered 2020-07-16 – 2020-07-18 (×4): 20 mg via ORAL
  Filled 2020-07-16 (×4): qty 1

## 2020-07-16 MED ORDER — KETOROLAC TROMETHAMINE 30 MG/ML IJ SOLN
15.0000 mg | Freq: Once | INTRAMUSCULAR | Status: AC
  Administered 2020-07-16: 15 mg via INTRAVENOUS
  Filled 2020-07-16: qty 1

## 2020-07-16 MED ORDER — KETOROLAC TROMETHAMINE 15 MG/ML IJ SOLN
15.0000 mg | Freq: Four times a day (QID) | INTRAMUSCULAR | Status: DC
  Administered 2020-07-16 – 2020-07-18 (×8): 15 mg via INTRAVENOUS
  Filled 2020-07-16 (×8): qty 1

## 2020-07-16 MED ORDER — DIPHENHYDRAMINE HCL 25 MG PO CAPS: 25.0000 mg | ORAL_CAPSULE | ORAL | Status: DC | PRN

## 2020-07-16 MED ORDER — FOLIC ACID 1 MG PO TABS
1.0000 mg | ORAL_TABLET | Freq: Every day | ORAL | Status: DC
  Administered 2020-07-16 – 2020-07-18 (×3): 1 mg via ORAL
  Filled 2020-07-16 (×3): qty 1

## 2020-07-16 MED ORDER — ENOXAPARIN SODIUM 40 MG/0.4ML IJ SOSY
40.0000 mg | PREFILLED_SYRINGE | INTRAMUSCULAR | Status: DC
  Administered 2020-07-16 – 2020-07-17 (×2): 40 mg via SUBCUTANEOUS
  Filled 2020-07-16 (×2): qty 0.4

## 2020-07-16 MED ORDER — HYDROMORPHONE 1 MG/ML IV SOLN
INTRAVENOUS | Status: DC
  Administered 2020-07-16 (×2): 30 mg via INTRAVENOUS
  Administered 2020-07-16: 19 mg via INTRAVENOUS
  Administered 2020-07-17: 30 mg via INTRAVENOUS
  Administered 2020-07-17: 9 mg via INTRAVENOUS
  Administered 2020-07-17: 11.4 mg via INTRAVENOUS
  Filled 2020-07-16 (×3): qty 30

## 2020-07-16 MED ORDER — ACETAMINOPHEN 500 MG PO TABS
1000.0000 mg | ORAL_TABLET | Freq: Once | ORAL | Status: AC
  Administered 2020-07-16: 1000 mg via ORAL
  Filled 2020-07-16: qty 2

## 2020-07-16 MED ORDER — NALOXONE HCL 0.4 MG/ML IJ SOLN: 0.4000 mg | INTRAMUSCULAR | Status: DC | PRN

## 2020-07-16 MED ORDER — SODIUM CHLORIDE 0.9% FLUSH: 9.0000 mL | INTRAVENOUS | Status: DC | PRN

## 2020-07-16 MED ORDER — ONDANSETRON HCL 4 MG/2ML IJ SOLN
4.0000 mg | Freq: Four times a day (QID) | INTRAMUSCULAR | Status: DC | PRN
  Administered 2020-07-16 – 2020-07-17 (×3): 4 mg via INTRAVENOUS
  Filled 2020-07-16 (×3): qty 2

## 2020-07-16 MED ORDER — VALACYCLOVIR HCL 500 MG PO TABS
500.0000 mg | ORAL_TABLET | Freq: Every day | ORAL | Status: DC
  Administered 2020-07-16 – 2020-07-18 (×3): 500 mg via ORAL
  Filled 2020-07-16 (×3): qty 1

## 2020-07-16 NOTE — Telephone Encounter (Signed)
Patient called, requesting to come to the day hospital due to pain in the legs rated at 9/10. Denied chest pain, fever, diarrhea, abdominal pain, nausea/vomitting. Screened negative for Covid-19 symptoms. Admitted to having means of transportation without driving self after treatment. Last took 18 mg of Xtampza. At 22:00 on 07/15/2020. Per provider, patient can come to the day hospital for treatment. Patient notified, verbalized understanding.

## 2020-07-16 NOTE — H&P (Signed)
[facility] History and Physical   Date: 07/16/2020  Patient name: Stephanie Burch Medical record number: [id number] Date of birth: [birth year] Age: 40 y.o. Gender: female PCP: Kallie Locks, FNP (Inactive)  Attending physician: Quentin Angst, MD  Chief Complaint: sickle cell pain  History of Present Illness: Uliana Brinker is a  67 year ols female with a medical history significant for sickle cell disease, chronic pain syndrome, opiate dependence and tolerance, anemia of chronic disease, and morbid obesity presents with complaints of bilateral lower extremity pain.  Patient says that pain is consistent with previous sickle cell pain crisis.  Patient was treated and evaluated in the emergency department on 07/15/2020 without complete resolution and was advised to return to clinic on today.  Patient has not identified any provoking factors concerning crisis.  Patient last had Xtampza ER and oxycodone this a.m. without much improvement.  She says that pain is primarily to lower extremities and characterizes pain as constant and aching.  She denies any headache, shortness of breath, fever, chills, chest pain, or dizziness.  No urinary symptoms, nausea, vomiting, or diarrhea.  No sick contacts, recent travel, or exposure to COVID-19.  Meds: Medications Prior to Admission  Medication Sig Dispense Refill Last Dose   folic acid (FOLVITE) 1 MG tablet Take 1 tablet (1 mg total) by mouth daily. 30 tablet 11    ibuprofen (ADVIL) 800 MG tablet TAKE 1 TABLET (800 MG TOTAL) BY MOUTH EVERY 8 (EIGHT) HOURS AS NEEDED FOR HEADACHE, MILD PAIN, MODERATE PAIN OR CRAMPING. 30 tablet 1    ondansetron (ZOFRAN) 4 MG tablet Take 4 mg by mouth every 8 (eight) hours as needed for nausea/vomiting.      oxyCODONE ER (XTAMPZA ER) 18 MG C12A Take 18 mg by mouth every 12 (twelve) hours. 60 capsule 0    Oxycodone HCl 20 MG TABS Take 1 tablet (20 mg total) by mouth every 4 (four) hours as needed for up to 3  doses (pain). 3 tablet 0    tiZANidine (ZANAFLEX) 4 MG capsule Take 1 capsule (4 mg total) by mouth 3 (three) times daily as needed for muscle spasms. 90 capsule 6    valACYclovir (VALTREX) 500 MG tablet Take Valtrex 500 mg daily for suppression of outbreaks. Take Valtrex 1000 mg daily X 1 week for outbreaks. (Patient taking differently: Take 500 mg by mouth daily.) 90 tablet 11    Vitamin D, Ergocalciferol, (DRISDOL) 1.25 MG (50000 UT) CAPS capsule Take 1 capsule (50,000 Units total) by mouth every 7 (seven) days. (Patient taking differently: Take 50,000 Units by mouth every 7 (seven) days. Take on Sunday) 5 capsule 6     Allergies: Patient has no known allergies. Past Medical History:  Diagnosis Date   HSV infection    Nausea    Sickle cell anemia (HCC)    Vitamin B12 deficiency 12/2018   Vitamin D deficiency    No past surgical history on file. Family History  Problem Relation Age of Onset   Hypertension Mother    Sickle cell trait Mother    Diabetes Father    Sickle cell trait Father    Social History   Socioeconomic History   Marital status: Single    Spouse name: Not on file   Number of children: Not on file   Years of education: Not on file   Highest education level: Not on file  Occupational History   Not on file  Tobacco Use   Smoking status: Never  Smokeless tobacco: Never  Vaping Use   Vaping Use: Never used  Substance and Sexual Activity   Alcohol use: Not Currently   Drug use: Never   Sexual activity: Yes    Birth control/protection: None  Other Topics Concern   Not on file  Social History Narrative   Not on file   Social Determinants of Health   Financial Resource Strain: Not on file  Food Insecurity: Not on file  Transportation Needs: Not on file  Physical Activity: Not on file  Stress: Not on file  Social Connections: Not on file  Intimate Partner Violence: Not on file   Review of Systems  Constitutional: Negative.   HENT: Negative.     Eyes: Negative.   Respiratory: Negative.    Cardiovascular: Negative.   Gastrointestinal: Negative.   Genitourinary: Negative.   Musculoskeletal:  Positive for back pain and joint pain.  Skin: Negative.   Neurological: Negative.   Endo/Heme/Allergies: Negative.   Psychiatric/Behavioral: Negative.     Physical Exam: Last menstrual period 06/17/2020. Physical Exam Constitutional:      Appearance: She is obese.  Eyes:     Pupils: Pupils are equal, round, and reactive to light.  Cardiovascular:     Rate and Rhythm: Normal rate and regular rhythm.  Pulmonary:     Effort: Pulmonary effort is normal.  Abdominal:     General: Bowel sounds are normal.  Skin:    General: Skin is warm.  Neurological:     General: No focal deficit present.     Mental Status: She is alert. Mental status is at baseline.  Psychiatric:        Mood and Affect: Mood normal.        Thought Content: Thought content normal.        Judgment: Judgment normal.     Lab results: No results found for this or any previous visit (from the past 24 hour(s)).  Imaging results:  DG Chest Portable 1 View  Result Date: 07/15/2020 CLINICAL DATA:  Bilateral leg pain for the past 2 days. Sickle cell disease. EXAM: PORTABLE CHEST 1 VIEW COMPARISON:  05/18/2020 FINDINGS: Stable enlarged cardiac silhouette. Clear lungs with normal vascularity. Unremarkable bones. IMPRESSION: No acute abnormality.  Stable mild cardiomegaly Electronically Signed   By: Beckie Salts M.D.   On: 07/15/2020 10:30     Assessment & Plan: Patient admitted to sickle cell day infusion center for management of pain crisis.  Patient is opiate tolerant Initiate IV dilaudid PCA. Settings of 0.6 mg, 10-minute lockout, and 4 mg/h IV fluids, 0.45% saline at 100 mL/h Toradol 15 mg IV times one dose Tylenol 1000 mg by mouth times one dose Review CBC with differential, complete metabolic panel, and reticulocytes as results become available. Baseline  hemoglobin is 10-11 g/dL Pain intensity will be reevaluated in context of functioning and relationship to baseline as care progresses If pain intensity remains elevated and/or sudden change in hemodynamic stability transition to inpatient services for higher level of care.     Nolon Nations  APRN, MSN, FNP-C Patient Care [facility] [address] [place], [place] [address] [phone]  07/16/2020, 9:27 AM

## 2020-07-16 NOTE — Progress Notes (Signed)
Patient admitted to the day hospital for treatment of sickle cell pain crisis. Patient reported pain rated 8/10 in the legs . Patient placed on Dilaudid PCA, given IV Toradol, PO Tylenol and hydrated with IV fluids.Transferred to [facility]. On transfer pain was 8/10.  Hand off report was given to [facility]. Alert, oriented and transported in a wheelchair.

## 2020-07-16 NOTE — H&P (Addendum)
H&P  Patient Demographics:  Stephanie Burch, is a 40 y.o. female  MRN: [id number]   DOB - Dec 07, 1980  Admit Date - 07/16/2020  Outpatient Primary MD for the patient is Kallie Locks, FNP (Inactive)       HPI:  Stephanie Burch is a  24 year ols female with a medical history significant for sickle cell disease, chronic pain syndrome, opiate dependence and tolerance, anemia of chronic disease, and morbid obesity presents with complaints of bilateral lower extremity pain.  Patient says that pain is consistent with previous sickle cell pain crisis.  Patient was treated and evaluated in the emergency department on 07/15/2020 without complete resolution and was advised to return to clinic on today.  Patient has not identified any provoking factors concerning crisis.  Patient last had Xtampza ER and oxycodone this a.m. without much improvement.  She says that pain is primarily to lower extremities and characterizes pain as constant and aching.  She currently rates her pain as 7/10.  She denies any headache, shortness of breath, fever, chills, chest pain, or dizziness.  No urinary symptoms, nausea, vomiting, or diarrhea.  No sick contacts, recent travel, or exposure to COVID-19.  Sickle cell day clinic course:  Blood pressure 135/71, pulse 103, respirations 14, and oxygen saturation is 94% on RA.  Complete blood count and complete metabolic panel unremarkable.  COVID-19 pending.  Patient's pain persists despite IV Dilaudid PCA, IV Toradol, IV fluids, and Tylenol.  Patient admitted to MedSurg for further management of sickle cell pain crisis.   Review of systems:  In addition to the HPI above, patient reports No fever or chills No Headache, No changes with vision or hearing No problems swallowing food or liquids No chest pain, cough or shortness of breath No abdominal pain, No nausea or vomiting, Bowel movements are regular No blood in stool or urine No dysuria No new skin rashes or bruises No new  joints pains-aches No new weakness, tingling, numbness in any extremity No recent weight gain or loss No polyuria, polydypsia or polyphagia No significant Mental Stressors  With Past History of the following :   Past Medical History:  Diagnosis Date   HSV infection    Nausea    Sickle cell anemia (HCC)    Vitamin B12 deficiency 12/2018   Vitamin D deficiency       History reviewed. No pertinent surgical history.   Social History:   Social History   Tobacco Use   Smoking status: Never   Smokeless tobacco: Never  Substance Use Topics   Alcohol use: Not Currently     Lives - At home   Family History :   Family History  Problem Relation Age of Onset   Hypertension Mother    Sickle cell trait Mother    Diabetes Father    Sickle cell trait Father      Home Medications:   Prior to Admission medications   Medication Sig Start Date End Date Taking? Authorizing Provider  ibuprofen (ADVIL) 800 MG tablet TAKE 1 TABLET (800 MG TOTAL) BY MOUTH EVERY 8 (EIGHT) HOURS AS NEEDED FOR HEADACHE, MILD PAIN, MODERATE PAIN OR CRAMPING. 04/10/20  Yes Kallie Locks, FNP  oxyCODONE ER [facility] ER) 18 MG C12A Take 18 mg by mouth every 12 (twelve) hours. 06/21/20  Yes Massie Maroon, FNP  Oxycodone HCl 20 MG TABS Take 1 tablet (20 mg total) by mouth every 4 (four) hours as needed for up to 3 doses (pain). 07/15/20  Yes Mare Ferrari, PA-C  tiZANidine (ZANAFLEX) 4 MG capsule Take 1 capsule (4 mg total) by mouth 3 (three) times daily as needed for muscle spasms. 05/08/20 12/04/20 Yes Kallie Locks, FNP  valACYclovir (VALTREX) 500 MG tablet Take Valtrex 500 mg daily for suppression of outbreaks. Take Valtrex 1000 mg daily X 1 week for outbreaks. Patient taking differently: Take 500 mg by mouth daily. 10/20/19  Yes Kallie Locks, FNP  folic acid (FOLVITE) 1 MG tablet Take 1 tablet (1 mg total) by mouth daily. 11/15/18   Kallie Locks, FNP  ondansetron (ZOFRAN) 4 MG tablet Take 4 mg  by mouth every 8 (eight) hours as needed for nausea/vomiting. 06/21/20   [provider]  Vitamin D, Ergocalciferol, (DRISDOL) 1.25 MG (50000 UT) CAPS capsule Take 1 capsule (50,000 Units total) by mouth every 7 (seven) days. Patient taking differently: Take 50,000 Units by mouth every 7 (seven) days. Take on Sunday 12/21/18   Kallie Locks, FNP     Allergies:   No Known Allergies   Physical Exam:   Vitals:   Vitals:   07/16/20 1136 07/16/20 1410  BP: 121/72 135/71  Pulse: (!) 108 (!) 103  Resp: 20 14  Temp:    SpO2: 99% 94%    Physical Exam: Constitutional: Patient appears well-developed and well-nourished. Not in obvious distress. Morbid obesity HENT: Normocephalic, atraumatic, External right and left ear normal. Oropharynx is clear and moist.  Eyes: Conjunctivae and EOM are normal. PERRLA, no scleral icterus. Neck: Normal ROM. Neck supple. No JVD. No tracheal deviation. No thyromegaly. CVS: RRR, S1/S2 +, no murmurs, no gallops, no carotid bruit.  Pulmonary: Effort and breath sounds normal, no stridor, rhonchi, wheezes, rales.  Abdominal: Soft. BS +, no distension, tenderness, rebound or guarding.  Musculoskeletal: Normal range of motion. No edema and no tenderness.  Lymphadenopathy: No lymphadenopathy noted, cervical, inguinal or axillary Neuro: Alert. Normal reflexes, muscle tone coordination. No cranial nerve deficit. Skin: Skin is warm and dry. No rash noted. Not diaphoretic. No erythema. No pallor. Psychiatric: Normal mood and affect. Behavior, judgment, thought content normal.   Data Review:   CBC Recent Labs  Lab 07/15/20 0906  WBC 10.4  HGB 12.2  HCT 35.4*  PLT 320  MCV 67.3*  MCH 23.2*  MCHC 34.5  RDW 16.7*  LYMPHSABS 1.9  MONOABS 0.7  EOSABS 0.1  BASOSABS 0.0   ------------------------------------------------------------------------------------------------------------------  Chemistries  Recent Labs  Lab 07/15/20 0906  NA 138  K  3.9  CL 108  CO2 21*  GLUCOSE 129*  BUN 9  CREATININE 0.66  CALCIUM 9.3  AST 14*  ALT 14  ALKPHOS 79  BILITOT 0.8   ------------------------------------------------------------------------------------------------------------------ CrCl cannot be calculated (Unknown ideal weight.). ------------------------------------------------------------------------------------------------------------------ No results for input(s): TSH, T4TOTAL, T3FREE, THYROIDAB in the last 72 hours.  Invalid input(s): FREET3  Coagulation profile No results for input(s): INR, PROTIME in the last 168 hours. ------------------------------------------------------------------------------------------------------------------- No results for input(s): DDIMER in the last 72 hours. -------------------------------------------------------------------------------------------------------------------  Cardiac Enzymes No results for input(s): CKMB, TROPONINI, MYOGLOBIN in the last 168 hours.  Invalid input(s): CK ------------------------------------------------------------------------------------------------------------------ No results found for: BNP  ---------------------------------------------------------------------------------------------------------------  Urinalysis    Component Value Date/Time   COLORURINE YELLOW 06/12/2020 1025   APPEARANCEUR CLEAR 06/12/2020 1025   LABSPEC 1.011 06/12/2020 1025   PHURINE 6.0 06/12/2020 1025   GLUCOSEU NEGATIVE 06/12/2020 1025   HGBUR NEGATIVE 06/12/2020 1025   BILIRUBINUR NEGATIVE 06/12/2020 1025   BILIRUBINUR Negative 04/27/2019 0935   KETONESUR  NEGATIVE 06/12/2020 1025   PROTEINUR NEGATIVE 06/12/2020 1025   UROBILINOGEN 1.0 04/27/2019 0935   NITRITE NEGATIVE 06/12/2020 1025   LEUKOCYTESUR NEGATIVE 06/12/2020 1025    ----------------------------------------------------------------------------------------------------------------   Imaging Results:    DG Chest  Portable 1 View  Result Date: 07/15/2020 CLINICAL DATA:  Bilateral leg pain for the past 2 days. Sickle cell disease. EXAM: PORTABLE CHEST 1 VIEW COMPARISON:  05/18/2020 FINDINGS: Stable enlarged cardiac silhouette. Clear lungs with normal vascularity. Unremarkable bones. IMPRESSION: No acute abnormality.  Stable mild cardiomegaly Electronically Signed   By: Beckie Salts M.D.   On: 07/15/2020 10:30     Assessment & Plan:  Principal Problem:   Sickle cell pain crisis (HCC) Active Problems:   Morbid obesity with BMI of 45.0-49.9, adult (HCC)   Chronic pain syndrome  Sickle cell disease with pain crisis: Admit to MedSurg, continue IV Dilaudid PCA without any changes in settings. IV fluids, 0.45% saline at 100 mL/h Toradol 15 mg IV every 6 hours for total of 5 days OxyContin 20 mg every 12 hours Hold oxycodone, use PCA at this time Monitor vital signs very closely, reevaluate pain scale regularly, and supplemental oxygen as needed  Sickle cell anemia: Hemoglobin 12, consistent with patient's baseline.  Hemoglobin within normal limits and there is no clinical indication for blood transfusion.  Continue to follow closely. Continue folic acid  Chronic pain syndrome: Continue home medications  Morbid obesity: Patient counseled extensively on the importance of a balanced diet and exercise regimen. Heart healthy diet    DVT Prophylaxis: Subcut Lovenox and SCDs  AM Labs Ordered, also please review Full Orders  Family Communication: Admission, patient's condition and plan of care including tests being ordered have been discussed with the patient who indicate understanding and agree with the plan and Code Status.  Code Status: Full Code  Consults called: None    Admission status: Inpatient    Time spent in minutes : 35 minutes   Nolon Nations  APRN, MSN, FNP-C Patient Care [facility] [address] [place], [place]  [address] [phone]  07/16/2020 at 2:53 PM

## 2020-07-17 ENCOUNTER — Encounter (HOSPITAL_COMMUNITY): Payer: Medicare Other

## 2020-07-17 ENCOUNTER — Other Ambulatory Visit: Payer: Self-pay

## 2020-07-17 LAB — CBC
HCT: 33.8 % — ABNORMAL LOW (ref 36.0–46.0)
Hemoglobin: 11.2 g/dL — ABNORMAL LOW (ref 12.0–15.0)
MCH: 22.7 pg — ABNORMAL LOW (ref 26.0–34.0)
MCHC: 33.1 g/dL (ref 30.0–36.0)
MCV: 68.6 fL — ABNORMAL LOW (ref 80.0–100.0)
Platelets: 234 10*3/uL (ref 150–400)
RBC: 4.93 MIL/uL (ref 3.87–5.11)
RDW: 16.2 % — ABNORMAL HIGH (ref 11.5–15.5)
WBC: 10.9 10*3/uL — ABNORMAL HIGH (ref 4.0–10.5)
nRBC: 0 % (ref 0.0–0.2)

## 2020-07-17 LAB — SARS CORONAVIRUS 2 (TAT 6-24 HRS): SARS Coronavirus 2: NEGATIVE

## 2020-07-17 MED ORDER — OXYCODONE HCL 5 MG PO TABS
20.0000 mg | ORAL_TABLET | ORAL | Status: DC | PRN
  Administered 2020-07-17 – 2020-07-18 (×7): 20 mg via ORAL
  Filled 2020-07-17 (×7): qty 4

## 2020-07-17 MED ORDER — HYDROMORPHONE 1 MG/ML IV SOLN
INTRAVENOUS | Status: DC
  Administered 2020-07-17: 6 mg via INTRAVENOUS
  Administered 2020-07-17: 3.5 mg via INTRAVENOUS
  Administered 2020-07-17: 13.2 mg via INTRAVENOUS
  Administered 2020-07-18: 5.5 mg via INTRAVENOUS
  Administered 2020-07-18: 30 mg via INTRAVENOUS
  Administered 2020-07-18: 10 mg via INTRAVENOUS
  Filled 2020-07-17: qty 30

## 2020-07-17 NOTE — TOC Initial Note (Signed)
Transition of Care [facility]) - Initial/Assessment Note   Patient Details  Name: Stephanie Burch MRN: [id number] Date of Birth: 04/01/1980  Transition of Care [facility]) CM/SW Contact:    Sherie Don, LCSW Phone Number: 07/17/2020, 11:17 AM  Clinical Narrative: Patient is a 40 year old female who was admitted for sickle cell pain crisis. Readmission checklist completed due to high readmission score. TOC received consult for patient having difficulty with picking up medications.  CSW met with patient to complete assessment. Per patient, she resides at home with children. Patient is typically independent with ADLs at baseline, although her pain makes this difficult. Patient reported her issues with medications is not affording them, but driving to pick them up due to her pain. Patient asked about personal care services [facility]) to assist with ADLs and errands. CSW explained patient will need to call her Medicaid to see if PCS is one of her benefits. If it is, she can contact a PCS agency and a form will be submitted to her PCP for justification of PCS. The agency can set up services after the form is received and PCS is approved by Medicaid. Patient is currently transporting herself to medical appointments and to pick up medications, although her fiancee assists as needed. Patient is not active with [employer] services at this time nor does she have any DME. TOC to follow.  Expected Discharge Plan: Home/Self Care Barriers to Discharge: Continued Medical Work up  Patient Goals and CMS Choice Patient states their goals for this hospitalization and ongoing recovery are:: Get help at home Choice offered to / list presented to : NA  Expected Discharge Plan and Services Expected Discharge Plan: Home/Self Care In-house Referral: Clinical Social Work Post Acute Care Choice: NA Living arrangements for the past 2 months: Single Family Home              DME Arranged: N/A DME Agency: NA  Prior Living  Arrangements/Services Living arrangements for the past 2 months: Single Family Home Lives with:: Minor Children Patient language and need for interpreter reviewed:: Yes Do you feel safe going back to the place where you live?: Yes      Need for Family Participation in Patient Care: Yes (Comment) Care giver support system in place?: Yes (comment) Criminal Activity/Legal Involvement Pertinent to Current Situation/Hospitalization: No - Comment as needed  Activities of Daily Living Home Assistive Devices/Equipment: None ADL Screening (condition at time of admission) Patient's cognitive ability adequate to safely complete daily activities?: Yes Is the patient deaf or have difficulty hearing?: No Does the patient have difficulty seeing, even when wearing glasses/contacts?: No Does the patient have difficulty concentrating, remembering, or making decisions?: No Patient able to express need for assistance with ADLs?: Yes Does the patient have difficulty dressing or bathing?: No Independently performs ADLs?: Yes (appropriate for developmental age) Does the patient have difficulty walking or climbing stairs?: No Weakness of Legs: None Weakness of Arms/Hands: None  Emotional Assessment Appearance:: Appears stated age Attitude/Demeanor/Rapport: Engaged Affect (typically observed): Accepting Orientation: : Oriented to Self, Oriented to Place, Oriented to  Time, Oriented to Situation Alcohol / Substance Use: Not Applicable Psych Involvement: No (comment)  Admission diagnosis:  Sickle cell pain crisis [facility]) [D57.00] Patient Active Problem List   Diagnosis Date Noted   Sickle cell anemia with crisis ([place]) 06/12/2020   Pulmonary edema 11/11/2019   Hypoxia    Poor venous access 11/09/2019   HCAP (healthcare-associated pneumonia) 11/07/2019   Sepsis ([place]) 11/07/2019   Acute kidney  injury ([place]) 11/07/2019   History of COVID-19 11/07/2019   COVID-19 virus detected 10/22/2019   Sore throat due  to virus 10/22/2019   Urinary frequency 04/20/2019   Vaginal odor 04/20/2019   Chest pain varying with breathing    Fever and chills    Vitamin D deficiency 11/15/2018   Nausea 11/15/2018   Abnormal pulse oximetry    Hypokalemia 09/20/2018   Medication management 09/14/2018   Muscle spasms of both lower extremities 08/25/2018   Subjective visual disturbance of right eye 08/25/2018   Hb-SS disease without crisis ([place]) 04/13/2018   Chronic pain syndrome 03/16/2018   Chronic, continuous use of opioids 03/16/2018   Tachycardia with heart rate 100-120 beats per minute    Sickle cell crisis ([place]) 10/19/2017   Leukocytosis 10/19/2017   Thrombocytopenia ([place]) 10/19/2017   Morbid obesity with BMI of 45.0-49.9, adult ([place]) 10/19/2017   Sickle cell pain crisis ([place]) 10/15/2017   PCP:  Azzie Glatter, FNP (Inactive) Pharmacy:   [id number] [place], [place] [address] Phone: [phone] Fax: [phone]  [place], [place] [address] Phone: [phone] Fax: [phone]  [id number] [place] [address], [place] [address] Phone: [phone] Fax: [phone]  Readmission Risk Interventions Readmission Risk Prevention Plan 07/17/2020 07/01/2019 04/20/2019  Transportation Screening Complete Complete Complete  PCP or Specialist Appt within 3-5 Days - Complete Complete  HRI or Home Care Consult Complete Not Complete Not Complete  HRI or Home Care Consult comments - NA NA  Social Work Consult for [place] Planning/Counseling Complete Not Complete Complete  SW consult not completed comments - Followed by SW at PCP -  Palliative Care Screening - Not Applicable Not Applicable  Medication Review (RN Care Manager) Complete Complete Complete  Some recent data might be  hidden

## 2020-07-17 NOTE — Progress Notes (Addendum)
Subjective:  Stephanie Burch is a 40 year old female with a medical history significant for sickle cell disease, chronic pain syndrome, opiate dependence and tolerance, anemia of chronic disease, and morbid obesity was admitted for sickle cell pain crisis. Patient says that pain is somewhat improved on today.  She rates her pain as 7/10 primarily to bilateral lower extremities.  Patient denies headache, shortness of breath, dizziness, chest pain, urinary symptoms, nausea, vomiting, or diarrhea. Objective:  Vital signs in last 24 hours:  Vitals:   07/17/20 0143 07/17/20 0435 07/17/20 0543 07/17/20 0733  BP: 136/83  137/75   Pulse: 91  90   Resp: 14 12 19 18   Temp:   97.7 F (36.5 C)   TempSrc:   Oral   SpO2: 96% 95% 95% 95%    Intake/Output from previous day:   Intake/Output Summary (Last 24 hours) at 07/17/2020 0900 Last data filed at 07/17/2020 0600 Gross per 24 hour  Intake 2028.2 ml  Output --  Net 2028.2 ml    Physical Exam: General: Alert, awake, oriented x3, in no acute distress.  HEENT: [place]/AT PEERL, EOMI Neck: Trachea midline,  no masses, no thyromegal,y no JVD, no carotid bruit OROPHARYNX:  Moist, No exudate/ erythema/lesions.  Heart: Regular rate and rhythm, without murmurs, rubs, gallops, PMI non-displaced, no heaves or thrills on palpation.  Lungs: Clear to auscultation, no wheezing or rhonchi noted. No increased vocal fremitus resonant to percussion  Abdomen: Soft, nontender, nondistended, positive bowel sounds, no masses no hepatosplenomegaly noted..  Neuro: No focal neurological deficits noted cranial nerves II through XII grossly intact. DTRs 2+ bilaterally upper and lower extremities. Strength 5 out of 5 in bilateral upper and lower extremities. Musculoskeletal: No warm swelling or erythema around joints, no spinal tenderness noted. Psychiatric: Patient alert and oriented x3, good insight and cognition, good recent to remote recall. Lymph node survey: No cervical  axillary or inguinal lymphadenopathy noted.  Lab Results:  Basic Metabolic Panel:    Component Value Date/Time   NA 138 07/15/2020 0906   NA 142 09/11/2017 1431   K 3.9 07/15/2020 0906   CL 108 07/15/2020 0906   CO2 21 (L) 07/15/2020 0906   BUN 9 07/15/2020 0906   BUN 8 09/11/2017 1431   CREATININE 0.66 07/15/2020 0906   GLUCOSE 129 (H) 07/15/2020 0906   CALCIUM 9.3 07/15/2020 0906   CBC:    Component Value Date/Time   WBC 10.9 (H) 07/17/2020 0535   HGB 11.2 (L) 07/17/2020 0535   HGB 13.0 09/11/2017 1431   HCT 33.8 (L) 07/17/2020 0535   HCT 38.4 09/11/2017 1431   PLT 234 07/17/2020 0535   PLT 258 09/11/2017 1431   MCV 68.6 (L) 07/17/2020 0535   MCV 75 (L) 09/11/2017 1431   NEUTROABS 7.6 07/15/2020 0906   NEUTROABS 3.9 09/11/2017 1431   LYMPHSABS 1.9 07/15/2020 0906   LYMPHSABS 2.2 09/11/2017 1431   MONOABS 0.7 07/15/2020 0906   EOSABS 0.1 07/15/2020 0906   EOSABS 0.1 09/11/2017 1431   BASOSABS 0.0 07/15/2020 0906   BASOSABS 0.0 09/11/2017 1431    Recent Results (from the past 240 hour(s))  SARS CORONAVIRUS 2 (TAT 6-24 HRS) Nasopharyngeal Nasopharyngeal Swab     Status: None   Collection Time: 07/16/20  5:31 PM   Specimen: Nasopharyngeal Swab  Result Value Ref Range Status   SARS Coronavirus 2 NEGATIVE NEGATIVE Final    Comment: (NOTE) SARS-CoV-2 target nucleic acids are NOT DETECTED.  The SARS-CoV-2 RNA is generally detectable in upper and  lower respiratory specimens during the acute phase of infection. Negative results do not preclude SARS-CoV-2 infection, do not rule out co-infections with other pathogens, and should not be used as the sole basis for treatment or other patient management decisions. Negative results must be combined with clinical observations, patient history, and epidemiological information. The expected result is Negative.  Fact Sheet for Patients: HairSlick.no  Fact Sheet for Healthcare  Providers: quierodirigir.com  This test is not yet approved or cleared by the [place] FDA and  has been authorized for detection and/or diagnosis of SARS-CoV-2 by FDA under an Emergency Use Authorization (EUA). This EUA will remain  in effect (meaning this test can be used) for the duration of the COVID-19 declaration under Se ction 564(b)(1) of the Act, 21 U.S.C. section 360bbb-3(b)(1), unless the authorization is terminated or revoked sooner.  Performed at [facility], [address] [place], [place] [address]     Studies/Results: DG Chest Portable 1 View  Result Date: 07/15/2020 CLINICAL DATA:  Bilateral leg pain for the past 2 days. Sickle cell disease. EXAM: PORTABLE CHEST 1 VIEW COMPARISON:  05/18/2020 FINDINGS: Stable enlarged cardiac silhouette. Clear lungs with normal vascularity. Unremarkable bones. IMPRESSION: No acute abnormality.  Stable mild cardiomegaly Electronically Signed   By: Beckie Salts M.D.   On: 07/15/2020 10:30    Medications: Scheduled Meds:  enoxaparin (LOVENOX) injection  40 mg Subcutaneous Q24H   folic acid  1 mg Oral Daily   HYDROmorphone   Intravenous Q4H   ketorolac  15 mg Intravenous Q6H   oxyCODONE  20 mg Oral Q12H   senna-docusate  1 tablet Oral BID   valACYclovir  500 mg Oral Daily   Continuous Infusions:  sodium chloride 100 mL/hr at 07/17/20 0600   PRN Meds:.diphenhydrAMINE, naloxone **AND** sodium chloride flush, ondansetron (ZOFRAN) IV, oxyCODONE, polyethylene glycol  Consultants: None  Procedures: None   Antibiotics: None  Assessment/Plan: Principal Problem:   Sickle cell pain crisis (HCC) Active Problems:   Morbid obesity with BMI of 45.0-49.9, adult (HCC)   Chronic pain syndrome  Sickle cell disease with pain crisis: Decrease IV fluids to KVO Weaning IV Dilaudid PCA, settings decreased Restart oxycodone 20 mg every 4 hours as needed for severe breakthrough pain Toradol 15 mg IV  every 6 hours for total of 5 days Monitor vital signs very closely, reevaluate pain scale regularly, and supplemental oxygen as needed.  Sickle cell anemia: Hemoglobin is stable and consistent with patient's baseline.  There is no clinical indication for blood transfusion at this time.  Continue to follow closely.  Chronic pain syndrome: Continue home medications  Morbid obesity continue heart healthy diet.  Patient counseled extensively on the importance of following a low-fat, low carbohydrate diet and exercise regimen.  Code Status: Full Code Family Communication: N/A Disposition Plan: Not yet ready for discharge   Taseen Marasigan Rennis Petty  APRN, MSN, FNP-C Patient Care [facility] [address] [place], [place] [address] [phone]  If 5PM-8AM, please contact night-coverage.  07/17/2020, 9:00 AM  LOS: 1 day

## 2020-07-18 ENCOUNTER — Other Ambulatory Visit: Payer: Self-pay

## 2020-07-18 ENCOUNTER — Telehealth: Payer: Self-pay

## 2020-07-18 ENCOUNTER — Other Ambulatory Visit: Payer: Self-pay | Admitting: Family Medicine

## 2020-07-18 DIAGNOSIS — D571 Sickle-cell disease without crisis: Secondary | ICD-10-CM

## 2020-07-18 DIAGNOSIS — G894 Chronic pain syndrome: Secondary | ICD-10-CM

## 2020-07-18 DIAGNOSIS — F1190 Opioid use, unspecified, uncomplicated: Secondary | ICD-10-CM

## 2020-07-18 LAB — CBC
HCT: 32 % — ABNORMAL LOW (ref 36.0–46.0)
Hemoglobin: 10.8 g/dL — ABNORMAL LOW (ref 12.0–15.0)
MCH: 23.1 pg — ABNORMAL LOW (ref 26.0–34.0)
MCHC: 33.8 g/dL (ref 30.0–36.0)
MCV: 68.5 fL — ABNORMAL LOW (ref 80.0–100.0)
Platelets: 240 10*3/uL (ref 150–400)
RBC: 4.67 MIL/uL (ref 3.87–5.11)
RDW: 16.5 % — ABNORMAL HIGH (ref 11.5–15.5)
WBC: 10.6 10*3/uL — ABNORMAL HIGH (ref 4.0–10.5)
nRBC: 0.6 % — ABNORMAL HIGH (ref 0.0–0.2)

## 2020-07-18 MED ORDER — XTAMPZA ER 18 MG PO C12A: 18.0000 mg | EXTENDED_RELEASE_CAPSULE | Freq: Two times a day (BID) | ORAL | 0 refills | Status: DC

## 2020-07-18 MED ORDER — OXYCODONE HCL 20 MG PO TABS: 20.0000 mg | ORAL_TABLET | ORAL | 0 refills | Status: DC | PRN

## 2020-07-18 NOTE — Progress Notes (Signed)
Reviewed PDMP substance reporting system prior to prescribing opiate medications. No inconsistencies noted.  Meds ordered this encounter  Medications   oxyCODONE ER (XTAMPZA ER) 18 MG C12A    Sig: Take 18 mg by mouth every 12 (twelve) hours.    Dispense:  60 capsule    Refill:  0    Please do not refill this medication prior to 07/22/2020. Thank you.    Order Specific Question:   Supervising Provider    Answer:   Quentin Angst [[id number]]   Oxycodone HCl 20 MG TABS    Sig: Take 1 tablet (20 mg total) by mouth every 4 (four) hours as needed (pain).    Dispense:  90 tablet    Refill:  0    Order Specific Question:   Supervising Provider    Answer:   Quentin Angst [[id number]]     Nolon Nations  APRN, MSN, FNP-C Patient Care [facility] [address] [place], [place] [address] [phone]

## 2020-07-18 NOTE — Discharge Summary (Signed)
Physician Discharge Summary  Stephanie Burch ASN:[id number] DOB: 18-Jan-1981 DOA: 07/16/2020  PCP: Kallie Locks, FNP (Inactive)  Admit date: 07/16/2020  Discharge date: 07/18/2020  Discharge Diagnoses:  Principal Problem:   Sickle cell pain crisis [facility]) Active Problems:   Morbid obesity with BMI of 45.0-49.9, adult (HCC)   Chronic pain syndrome   Discharge Condition: Stable  Disposition:  Pt is discharged home in good condition and is to follow up with Kallie Locks, FNP (Inactive) this week to have labs evaluated. Stephanie Burch is instructed to increase activity slowly and balance with rest for the next few days, and use prescribed medication to complete treatment of pain  Diet: Regular Wt Readings from Last 3 Encounters:  06/16/20 128.9 kg  05/19/20 127 kg  04/26/20 136.1 kg    History of present illness:  Stephanie Burch is a  89 year ols female with a medical history significant for sickle cell disease, chronic pain syndrome, opiate dependence and tolerance, anemia of chronic disease, and morbid obesity presents with complaints of bilateral lower extremity pain.  Patient says that pain is consistent with previous sickle cell pain crisis.  Patient was treated and evaluated in the emergency department on 07/15/2020 without complete resolution and was advised to return to clinic on today.  Patient has not identified any provoking factors concerning crisis.  Patient last had Xtampza ER and oxycodone this a.m. without much improvement.  She says that pain is primarily to lower extremities and characterizes pain as constant and aching.  She currently rates her pain as 7/10.  She denies any headache, shortness of breath, fever, chills, chest pain, or dizziness.  No urinary symptoms, nausea, vomiting, or diarrhea.  No sick contacts, recent travel, or exposure to COVID-19.   Sickle cell day clinic course:  Blood pressure 135/71, pulse 103, respirations 14, and oxygen saturation is 94% on  RA.  Complete blood count and complete metabolic panel unremarkable.  COVID-19 pending.  Patient's pain persists despite IV Dilaudid PCA, IV Toradol, IV fluids, and Tylenol.  Patient admitted to MedSurg for further management of sickle cell pain crisis.  Hospital Course:  Sickle cell disease with pain crisis: Patient was admitted for sickle cell pain crisis and managed appropriately with IVF, IV Dilaudid via PCA and IV Toradol, as well as other adjunct therapies per sickle cell pain management protocols.  IV Dilaudid PCA weaned appropriately.  Home medications resumed.  Patient's pain is controlled.  She rates her pain as 4/10 and is requesting discharge home. Patient will follow-up with PCP in 2 weeks and repeat CBC with differential and CMP at that time. She is alert, oriented, and ambulating without assistance.  Patient was therefore discharged home today in a hemodynamically stable condition.   Stephanie Burch will follow-up with PCP within 1 week of this discharge. Stephanie Burch was counseled extensively about nonpharmacologic means of pain management, patient verbalized understanding and was appreciative of  the care received during this admission.   We discussed the need for good hydration, monitoring of hydration status, avoidance of heat, cold, stress, and infection triggers. We discussed the need to be adherent with taking home medications.  Also I will patient encouraged to continue monthly Adakveo infusions.  Patient was reminded of the need to seek medical attention immediately if any symptom of bleeding, anemia, or infection occurs.  Discharge Exam: Vitals:   07/18/20 0959 07/18/20 1124  BP: (!) 138/95   Pulse: 86   Resp: 17 17  Temp: 98.4 F (36.9 C)  SpO2: 99% 99%   Vitals:   07/18/20 0528 07/18/20 0755 07/18/20 0959 07/18/20 1124  BP: 128/74  (!) 138/95   Pulse: 78  86   Resp: 19 18 17 17   Temp: 97.8 F (36.6 C)  98.4 F (36.9 C)   TempSrc: Oral  Oral   SpO2: 91% 91% 99% 99%     General appearance : Awake, alert, not in any distress. Speech Clear. Not toxic looking HEENT: Atraumatic and Normocephalic, pupils equally reactive to light and accomodation Neck: Supple, no JVD. No cervical lymphadenopathy.  Chest: Good air entry bilaterally, no added sounds  CVS: S1 S2 regular, no murmurs.  Abdomen: Bowel sounds present, Non tender and not distended with no gaurding, rigidity or rebound. Extremities: B/L Lower Ext shows no edema, both legs are warm to touch Neurology: Awake alert, and oriented X 3, CN II-XII intact, Non focal Skin: No Rash  Discharge Instructions   Allergies as of 07/18/2020   No Known Allergies      Medication List     TAKE these medications    folic acid 1 MG tablet Commonly known as: FOLVITE Take 1 tablet (1 mg total) by mouth daily.   ibuprofen 800 MG tablet Commonly known as: ADVIL TAKE 1 TABLET (800 MG TOTAL) BY MOUTH EVERY 8 (EIGHT) HOURS AS NEEDED FOR HEADACHE, MILD PAIN, MODERATE PAIN OR CRAMPING.   ondansetron 4 MG tablet Commonly known as: ZOFRAN Take 4 mg by mouth every 8 (eight) hours as needed for nausea/vomiting.   Oxycodone HCl 20 MG Tabs Take 1 tablet (20 mg total) by mouth every 4 (four) hours as needed for up to 3 doses (pain).   tiZANidine 4 MG capsule Commonly known as: ZANAFLEX Take 1 capsule (4 mg total) by mouth 3 (three) times daily as needed for muscle spasms.   Xtampza ER 18 MG C12a Generic drug: oxyCODONE ER Take 18 mg by mouth every 12 (twelve) hours.       ASK your doctor about these medications    valACYclovir 500 MG tablet Commonly known as: Valtrex Take Valtrex 500 mg daily for suppression of outbreaks. Take Valtrex 1000 mg daily X 1 week for outbreaks.   Vitamin D (Ergocalciferol) 1.25 MG (50000 UNIT) Caps capsule Commonly known as: DRISDOL Take 1 capsule (50,000 Units total) by mouth every 7 (seven) days.        The results of significant diagnostics from this hospitalization  (including imaging, microbiology, ancillary and laboratory) are listed below for reference.    Significant Diagnostic Studies: DG Chest Portable 1 View  Result Date: 07/15/2020 CLINICAL DATA:  Bilateral leg pain for the past 2 days. Sickle cell disease. EXAM: PORTABLE CHEST 1 VIEW COMPARISON:  05/18/2020 FINDINGS: Stable enlarged cardiac silhouette. Clear lungs with normal vascularity. Unremarkable bones. IMPRESSION: No acute abnormality.  Stable mild cardiomegaly Electronically Signed   By: 05/20/2020 M.D.   On: 07/15/2020 10:30    Microbiology: Recent Results (from the past 240 hour(s))  SARS CORONAVIRUS 2 (TAT 6-24 HRS) Nasopharyngeal Nasopharyngeal Swab     Status: None   Collection Time: 07/16/20  5:31 PM   Specimen: Nasopharyngeal Swab  Result Value Ref Range Status   SARS Coronavirus 2 NEGATIVE NEGATIVE Final    Comment: (NOTE) SARS-CoV-2 target nucleic acids are NOT DETECTED.  The SARS-CoV-2 RNA is generally detectable in upper and lower respiratory specimens during the acute phase of infection. Negative results do not preclude SARS-CoV-2 infection, do not rule out co-infections with other pathogens,  and should not be used as the sole basis for treatment or other patient management decisions. Negative results must be combined with clinical observations, patient history, and epidemiological information. The expected result is Negative.  Fact Sheet for Patients: HairSlick.no  Fact Sheet for Healthcare Providers: quierodirigir.com  This test is not yet approved or cleared by the [place] FDA and  has been authorized for detection and/or diagnosis of SARS-CoV-2 by FDA under an Emergency Use Authorization (EUA). This EUA will remain  in effect (meaning this test can be used) for the duration of the COVID-19 declaration under Se ction 564(b)(1) of the Act, 21 U.S.C. section 360bbb-3(b)(1), unless the authorization is  terminated or revoked sooner.  Performed at [facility], [address] [place], [place] [address]      Labs: Basic Metabolic Panel: Recent Labs  Lab 07/15/20 0906  NA 138  K 3.9  CL 108  CO2 21*  GLUCOSE 129*  BUN 9  CREATININE 0.66  CALCIUM 9.3   Liver Function Tests: Recent Labs  Lab 07/15/20 0906  AST 14*  ALT 14  ALKPHOS 79  BILITOT 0.8  PROT 8.1  ALBUMIN 3.9   No results for input(s): LIPASE, AMYLASE in the last 168 hours. No results for input(s): AMMONIA in the last 168 hours. CBC: Recent Labs  Lab 07/15/20 0906 07/17/20 0535 07/18/20 0542  WBC 10.4 10.9* 10.6*  NEUTROABS 7.6  --   --   HGB 12.2 11.2* 10.8*  HCT 35.4* 33.8* 32.0*  MCV 67.3* 68.6* 68.5*  PLT 320 234 240   Cardiac Enzymes: No results for input(s): CKTOTAL, CKMB, CKMBINDEX, TROPONINI in the last 168 hours. BNP: Invalid input(s): POCBNP CBG: No results for input(s): GLUCAP in the last 168 hours.  Time coordinating discharge: 35 minutes  Signed: Nolon Nations  APRN, MSN, FNP-C Patient Care [facility] [address] [place], [place] [address] [phone]  [facility] 07/18/2020, 11:43 AM

## 2020-07-18 NOTE — Telephone Encounter (Signed)
Oxycodone Xtampza Zolfran Ibuprofen

## 2020-07-19 ENCOUNTER — Telehealth: Payer: Self-pay

## 2020-07-19 NOTE — Telephone Encounter (Signed)
Transition Care Management Unsuccessful Follow-up Telephone Call  Date of discharge and from where:  07/18/2020 from [place]  Attempts:  1st Attempt  Reason for unsuccessful TCM follow-up call:  Left voice message    

## 2020-07-19 NOTE — Telephone Encounter (Signed)
done

## 2020-07-20 ENCOUNTER — Other Ambulatory Visit: Payer: Self-pay

## 2020-07-20 ENCOUNTER — Other Ambulatory Visit: Payer: Self-pay | Admitting: Family Medicine

## 2020-07-20 ENCOUNTER — Non-Acute Institutional Stay (HOSPITAL_COMMUNITY)
Admission: RE | Admit: 2020-07-20 | Discharge: 2020-07-20 | Disposition: A | Discharge status: Home / Self Care | Payer: Medicare Other | Source: Ambulatory Visit | Attending: Internal Medicine | Admitting: Internal Medicine

## 2020-07-20 DIAGNOSIS — D571 Sickle-cell disease without crisis: Secondary | ICD-10-CM

## 2020-07-20 MED ORDER — NON FORMULARY: 680.0000 mg | Freq: Once | Status: DC

## 2020-07-20 MED ORDER — SODIUM CHLORIDE 0.9 % IV SOLN
680.0000 mg | Freq: Once | INTRAVENOUS | Status: AC
  Administered 2020-07-20: 680 mg via INTRAVENOUS
  Filled 2020-07-20: qty 68

## 2020-07-20 NOTE — Progress Notes (Signed)
PATIENT CARE CENTER NOTE   Diagnosis: Sickle Cell Anemia      Provider: Hollis, [place], FNP     Procedure: Nettie Elm infusion      Note: Patient received Adakveo infusion via PIV. Patient tolerated well with no adverse reaction. Observed patient for 30 minutes post-infusion. Vital signs stable. Patient to come back every 4 weeks for infusion. Patient alert, oriented and ambulatory at discharge.

## 2020-07-20 NOTE — Progress Notes (Signed)
Allergies as of 07/20/2020   No Known Allergies      Medication List        Accurate as of July 20, 2020 11:00 AM. If you have any questions, ask your nurse or doctor.          crizanlizumab-tmca 100 MG/10ML Soln Commonly known as: ADAKVEO Inject 68 mLs (680 mg total) into the vein.   folic acid 1 MG tablet Commonly known as: FOLVITE Take 1 tablet (1 mg total) by mouth daily.   ibuprofen 800 MG tablet Commonly known as: ADVIL TAKE 1 TABLET (800 MG TOTAL) BY MOUTH EVERY 8 (EIGHT) HOURS AS NEEDED FOR HEADACHE, MILD PAIN, MODERATE PAIN OR CRAMPING.   ondansetron 4 MG tablet Commonly known as: ZOFRAN Take 4 mg by mouth every 8 (eight) hours as needed for nausea/vomiting.   Oxycodone HCl 20 MG Tabs Take 1 tablet (20 mg total) by mouth every 4 (four) hours as needed (pain).   tiZANidine 4 MG capsule Commonly known as: ZANAFLEX Take 1 capsule (4 mg total) by mouth 3 (three) times daily as needed for muscle spasms.   valACYclovir 500 MG tablet Commonly known as: Valtrex Take Valtrex 500 mg daily for suppression of outbreaks. Take Valtrex 1000 mg daily X 1 week for outbreaks. What changed:  how much to take how to take this when to take this additional instructions   Vitamin D (Ergocalciferol) 1.25 MG (50000 UNIT) Caps capsule Commonly known as: DRISDOL Take 1 capsule (50,000 Units total) by mouth every 7 (seven) days. What changed: additional instructions   Xtampza ER 18 MG C12a Generic drug: oxyCODONE ER Take 18 mg by mouth every 12 (twelve) hours. Start taking on: July 22, 2020

## 2020-07-20 NOTE — Telephone Encounter (Signed)
Transition Care Management Unsuccessful Follow-up Telephone Call  Date of discharge and from where:  07/18/2020 - [facility]  Attempts:  2nd Attempt  Reason for unsuccessful TCM follow-up call:  Left voice message

## 2020-07-23 NOTE — Telephone Encounter (Signed)
Transition Care Management Unsuccessful Follow-up Telephone Call  Date of discharge and from where:  07/18/2020 from [place]  Attempts:  3rd Attempt  Reason for unsuccessful TCM follow-up call:  Unable to reach patient    

## 2020-07-27 ENCOUNTER — Other Ambulatory Visit: Payer: Self-pay

## 2020-07-27 DIAGNOSIS — F1190 Opioid use, unspecified, uncomplicated: Secondary | ICD-10-CM

## 2020-07-27 DIAGNOSIS — G894 Chronic pain syndrome: Secondary | ICD-10-CM

## 2020-07-27 DIAGNOSIS — D571 Sickle-cell disease without crisis: Secondary | ICD-10-CM

## 2020-07-30 ENCOUNTER — Other Ambulatory Visit: Payer: Self-pay | Admitting: Family Medicine

## 2020-07-30 DIAGNOSIS — F1190 Opioid use, unspecified, uncomplicated: Secondary | ICD-10-CM

## 2020-07-30 DIAGNOSIS — G894 Chronic pain syndrome: Secondary | ICD-10-CM

## 2020-07-30 DIAGNOSIS — D571 Sickle-cell disease without crisis: Secondary | ICD-10-CM

## 2020-07-30 MED ORDER — OXYCODONE HCL 20 MG PO TABS: 20.0000 mg | ORAL_TABLET | ORAL | 0 refills | Status: DC | PRN

## 2020-07-30 NOTE — Progress Notes (Signed)
Reviewed PDMP substance reporting system prior to prescribing opiate medications. No inconsistencies noted.  Meds ordered this encounter  Medications   Oxycodone HCl 20 MG TABS    Sig: Take 1 tablet (20 mg total) by mouth every 4 (four) hours as needed (pain).    Dispense:  90 tablet    Refill:  0    Order Specific Question:   Supervising Provider    Answer:   Quentin Angst [[id number]]     Nolon Nations  APRN, MSN, FNP-C Patient Care [facility] [address] [place], [place] [address] [phone]

## 2020-08-09 ENCOUNTER — Other Ambulatory Visit: Payer: Self-pay

## 2020-08-09 ENCOUNTER — Telehealth: Payer: Self-pay

## 2020-08-09 DIAGNOSIS — M62838 Other muscle spasm: Secondary | ICD-10-CM

## 2020-08-09 DIAGNOSIS — D571 Sickle-cell disease without crisis: Secondary | ICD-10-CM

## 2020-08-09 MED ORDER — IBUPROFEN 800 MG PO TABS: 800.0000 mg | ORAL_TABLET | Freq: Three times a day (TID) | ORAL | 1 refills | Status: DC | PRN

## 2020-08-09 MED ORDER — ONDANSETRON HCL 4 MG PO TABS: 4.0000 mg | ORAL_TABLET | Freq: Three times a day (TID) | ORAL | 0 refills | Status: DC | PRN

## 2020-08-09 NOTE — Telephone Encounter (Signed)
Ibuprofen Zofran

## 2020-08-13 ENCOUNTER — Inpatient Hospital Stay (HOSPITAL_COMMUNITY)
Admission: EM | Admit: 2020-08-13 | Discharge: 2020-08-16 | DRG: 812 | Disposition: A | Discharge status: Home / Self Care | Payer: Medicare Other | Attending: Internal Medicine | Admitting: Internal Medicine

## 2020-08-13 ENCOUNTER — Encounter (HOSPITAL_COMMUNITY): Payer: Self-pay | Admitting: Emergency Medicine

## 2020-08-13 ENCOUNTER — Other Ambulatory Visit: Payer: Self-pay

## 2020-08-13 DIAGNOSIS — F1190 Opioid use, unspecified, uncomplicated: Secondary | ICD-10-CM

## 2020-08-13 DIAGNOSIS — Z8249 Family history of ischemic heart disease and other diseases of the circulatory system: Secondary | ICD-10-CM

## 2020-08-13 DIAGNOSIS — G894 Chronic pain syndrome: Secondary | ICD-10-CM | POA: Diagnosis present

## 2020-08-13 DIAGNOSIS — Z20822 Contact with and (suspected) exposure to covid-19: Secondary | ICD-10-CM | POA: Diagnosis present

## 2020-08-13 DIAGNOSIS — D638 Anemia in other chronic diseases classified elsewhere: Secondary | ICD-10-CM | POA: Diagnosis present

## 2020-08-13 DIAGNOSIS — D571 Sickle-cell disease without crisis: Secondary | ICD-10-CM

## 2020-08-13 DIAGNOSIS — Z833 Family history of diabetes mellitus: Secondary | ICD-10-CM

## 2020-08-13 DIAGNOSIS — F1120 Opioid dependence, uncomplicated: Secondary | ICD-10-CM | POA: Diagnosis present

## 2020-08-13 DIAGNOSIS — Z832 Family history of diseases of the blood and blood-forming organs and certain disorders involving the immune mechanism: Secondary | ICD-10-CM

## 2020-08-13 DIAGNOSIS — E559 Vitamin D deficiency, unspecified: Secondary | ICD-10-CM | POA: Diagnosis present

## 2020-08-13 DIAGNOSIS — D5700 Hb-SS disease with crisis, unspecified: Secondary | ICD-10-CM | POA: Diagnosis present

## 2020-08-13 LAB — CBC WITH DIFFERENTIAL/PLATELET
Abs Immature Granulocytes: 0.03 10*3/uL (ref 0.00–0.07)
Basophils Absolute: 0 10*3/uL (ref 0.0–0.1)
Basophils Relative: 0 %
Eosinophils Absolute: 0.1 10*3/uL (ref 0.0–0.5)
Eosinophils Relative: 1 %
HCT: 37.8 % (ref 36.0–46.0)
Hemoglobin: 13.1 g/dL (ref 12.0–15.0)
Immature Granulocytes: 0 %
Lymphocytes Relative: 20 %
Lymphs Abs: 1.8 10*3/uL (ref 0.7–4.0)
MCH: 23.1 pg — ABNORMAL LOW (ref 26.0–34.0)
MCHC: 34.7 g/dL (ref 30.0–36.0)
MCV: 66.5 fL — ABNORMAL LOW (ref 80.0–100.0)
Monocytes Absolute: 0.5 10*3/uL (ref 0.1–1.0)
Monocytes Relative: 6 %
Neutro Abs: 6.9 10*3/uL (ref 1.7–7.7)
Neutrophils Relative %: 73 %
Platelets: 292 10*3/uL (ref 150–400)
RBC: 5.68 MIL/uL — ABNORMAL HIGH (ref 3.87–5.11)
RDW: 15.8 % — ABNORMAL HIGH (ref 11.5–15.5)
WBC: 9.5 10*3/uL (ref 4.0–10.5)
nRBC: 0 % (ref 0.0–0.2)

## 2020-08-13 LAB — RESP PANEL BY RT-PCR (FLU A&B, COVID) ARPGX2
Influenza A by PCR: NEGATIVE
Influenza B by PCR: NEGATIVE
SARS Coronavirus 2 by RT PCR: NEGATIVE

## 2020-08-13 LAB — COMPREHENSIVE METABOLIC PANEL
ALT: 16 U/L (ref 0–44)
AST: 13 U/L — ABNORMAL LOW (ref 15–41)
Albumin: 4.3 g/dL (ref 3.5–5.0)
Alkaline Phosphatase: 79 U/L (ref 38–126)
Anion gap: 7 (ref 5–15)
BUN: 7 mg/dL (ref 6–20)
CO2: 22 mmol/L (ref 22–32)
Calcium: 9.5 mg/dL (ref 8.9–10.3)
Chloride: 112 mmol/L — ABNORMAL HIGH (ref 98–111)
Creatinine, Ser: 0.64 mg/dL (ref 0.44–1.00)
GFR, Estimated: >60 mL/min (ref >60)
Glucose, Bld: 111 mg/dL — ABNORMAL HIGH (ref 70–99)
Potassium: 4.1 mmol/L (ref 3.5–5.1)
Sodium: 141 mmol/L (ref 135–145)
Total Bilirubin: 0.9 mg/dL (ref 0.3–1.2)
Total Protein: 7.9 g/dL (ref 6.5–8.1)

## 2020-08-13 LAB — I-STAT BETA HCG BLOOD, ED (MC, WL, AP ONLY): I-stat hCG, quantitative: <5 m[IU]/mL (ref <5)

## 2020-08-13 LAB — RETICULOCYTES
Immature Retic Fract: 20 % — ABNORMAL HIGH (ref 2.3–15.9)
RBC.: 5.62 MIL/uL — ABNORMAL HIGH (ref 3.87–5.11)
Retic Count, Absolute: 111.8 10*3/uL (ref 19.0–186.0)
Retic Ct Pct: 2 % (ref 0.4–3.1)

## 2020-08-13 MED ORDER — POLYETHYLENE GLYCOL 3350 17 G PO PACK: 17.0000 g | PACK | Freq: Every day | ORAL | Status: DC | PRN

## 2020-08-13 MED ORDER — SODIUM CHLORIDE 0.9 % IV BOLUS
500.0000 mL | Freq: Once | INTRAVENOUS | Status: AC
  Administered 2020-08-13: 500 mL via INTRAVENOUS

## 2020-08-13 MED ORDER — HYDROMORPHONE 1 MG/ML IV SOLN
INTRAVENOUS | Status: DC
  Administered 2020-08-13: 4 mg via INTRAVENOUS
  Administered 2020-08-14: 15.5 mg via INTRAVENOUS
  Administered 2020-08-14: 8 mg via INTRAVENOUS
  Administered 2020-08-14: 9 mg via INTRAVENOUS
  Administered 2020-08-14: 3.5 mg via INTRAVENOUS
  Administered 2020-08-14: 30 mg via INTRAVENOUS
  Administered 2020-08-15: 7 mg via INTRAVENOUS
  Administered 2020-08-15: 30 mg via INTRAVENOUS
  Administered 2020-08-15: 5.5 mg via INTRAVENOUS
  Administered 2020-08-15: 11 mg via INTRAVENOUS
  Administered 2020-08-15: 5.5 mg via INTRAVENOUS
  Filled 2020-08-13 (×3): qty 30

## 2020-08-13 MED ORDER — HYDROMORPHONE HCL 2 MG/ML IJ SOLN
2.0000 mg | Freq: Once | INTRAMUSCULAR | Status: AC
  Administered 2020-08-13: 2 mg via INTRAVENOUS
  Filled 2020-08-13: qty 1

## 2020-08-13 MED ORDER — KETOROLAC TROMETHAMINE 15 MG/ML IJ SOLN
15.0000 mg | Freq: Four times a day (QID) | INTRAMUSCULAR | Status: DC
  Administered 2020-08-14 – 2020-08-16 (×11): 15 mg via INTRAVENOUS
  Filled 2020-08-13 (×12): qty 1

## 2020-08-13 MED ORDER — FOLIC ACID 1 MG PO TABS
1.0000 mg | ORAL_TABLET | Freq: Every day | ORAL | Status: DC
  Administered 2020-08-13 – 2020-08-16 (×4): 1 mg via ORAL
  Filled 2020-08-13 (×4): qty 1

## 2020-08-13 MED ORDER — SODIUM CHLORIDE 0.45 % IV SOLN: INTRAVENOUS | Status: DC

## 2020-08-13 MED ORDER — HYDROMORPHONE HCL 1 MG/ML IJ SOLN
1.0000 mg | INTRAMUSCULAR | Status: DC | PRN
  Administered 2020-08-13 (×2): 1 mg via INTRAVENOUS
  Filled 2020-08-13 (×2): qty 1

## 2020-08-13 MED ORDER — NALOXONE HCL 0.4 MG/ML IJ SOLN: 0.4000 mg | INTRAMUSCULAR | Status: DC | PRN

## 2020-08-13 MED ORDER — HYDROMORPHONE HCL 2 MG/ML IJ SOLN
2.0000 mg | INTRAMUSCULAR | Status: AC
  Administered 2020-08-13: 2 mg via INTRAVENOUS
  Filled 2020-08-13: qty 1

## 2020-08-13 MED ORDER — SENNOSIDES-DOCUSATE SODIUM 8.6-50 MG PO TABS
1.0000 | ORAL_TABLET | Freq: Two times a day (BID) | ORAL | Status: DC
  Administered 2020-08-15 – 2020-08-16 (×2): 1 via ORAL
  Filled 2020-08-13 (×6): qty 1

## 2020-08-13 MED ORDER — SODIUM CHLORIDE 0.9% FLUSH: 9.0000 mL | INTRAVENOUS | Status: DC | PRN

## 2020-08-13 MED ORDER — VALACYCLOVIR HCL 500 MG PO TABS
500.0000 mg | ORAL_TABLET | Freq: Every day | ORAL | Status: DC
  Administered 2020-08-13 – 2020-08-16 (×4): 500 mg via ORAL
  Filled 2020-08-13 (×4): qty 1

## 2020-08-13 MED ORDER — OXYCODONE HCL ER 20 MG PO T12A
20.0000 mg | EXTENDED_RELEASE_TABLET | Freq: Two times a day (BID) | ORAL | Status: DC
  Administered 2020-08-14 – 2020-08-16 (×6): 20 mg via ORAL
  Filled 2020-08-13 (×6): qty 1

## 2020-08-13 MED ORDER — DIPHENHYDRAMINE HCL 25 MG PO CAPS
25.0000 mg | ORAL_CAPSULE | ORAL | Status: DC | PRN
  Administered 2020-08-15: 25 mg via ORAL
  Filled 2020-08-13: qty 1

## 2020-08-13 MED ORDER — ONDANSETRON HCL 4 MG/2ML IJ SOLN
4.0000 mg | Freq: Four times a day (QID) | INTRAMUSCULAR | Status: DC | PRN
  Administered 2020-08-14 – 2020-08-16 (×4): 4 mg via INTRAVENOUS
  Filled 2020-08-13 (×4): qty 2

## 2020-08-13 MED ORDER — ENOXAPARIN SODIUM 40 MG/0.4ML IJ SOSY
40.0000 mg | PREFILLED_SYRINGE | Freq: Every day | INTRAMUSCULAR | Status: DC
  Administered 2020-08-13 – 2020-08-15 (×3): 40 mg via SUBCUTANEOUS
  Filled 2020-08-13 (×2): qty 0.4

## 2020-08-13 MED ORDER — KETOROLAC TROMETHAMINE 15 MG/ML IJ SOLN
15.0000 mg | Freq: Once | INTRAMUSCULAR | Status: AC
  Administered 2020-08-13: 15 mg via INTRAVENOUS
  Filled 2020-08-13: qty 1

## 2020-08-13 NOTE — Progress Notes (Signed)
Dear Doctor: This patient has been identified as a candidate for PICC for the following reason (s): very deep veins/poor vasculature If you agree, please write an order for the indicated device.   Thank you for supporting the early vascular access assessment program.

## 2020-08-13 NOTE — ED Triage Notes (Signed)
Patient reports sickle cell pain in bilateral legs x2 days.

## 2020-08-13 NOTE — H&P (Signed)
H&P  Patient Demographics:  Stephanie Burch, is a 40 y.o. female  MRN: [id number]   DOB - 29-May-1980  Admit Date - 08/13/2020  Outpatient Primary MD for the patient is Kallie Locks, FNP (Inactive)  Chief Complaint  Patient presents with   Sickle Cell Pain Crisis      HPI:   Stephanie Burch  is a 40 y.o. female with a medical history significant for sickle cell disease, chronic pain syndrome, opiate dependence and tolerance, morbid obesity, and history of anemia of chronic disease presents to the emergency department with a 2-day history of bilateral lower extremity pain that is consistent with her previous pain crisis.  Patient now weightbearing 2 days prior to admission with severe pain to lower extremities that was unrelieved by home medications.  She attributes current pain crisis to changes in weather.  She has been taking Xtampza ER and oxycodone consistently without sustained relief.  She characterizes lower extremity pain as constant and sharp.  She denies any fever, chills, chest pain, or shortness of breath.  No headache, dizziness, nausea, vomiting, or diarrhea.  No sick contacts, recent travel, or known exposure to COVID-19 infection.    ER Course:  While in ER, patient's vital signs remained stable. BP 122/61   Pulse 94   Temp 99 F (37.2 C)   Resp 18   LMP 07/18/2020   SpO2 99%  Complete metabolic panel and complete blood count largely within normal limits.  COVID-19 test negative.  Patient's pain persists despite IV fluids, IV Toradol, and IV Dilaudid.  Patient admitted to MedSurg for further management of sickle cell pain crisis.   Review of systems:   Review of Systems  Constitutional: Negative.   HENT: Negative.    Eyes: Negative.   Respiratory: Negative.    Cardiovascular: Negative.   Gastrointestinal: Negative.   Genitourinary: Negative.   Musculoskeletal:  Positive for joint pain.  Skin: Negative.   Neurological: Negative.   Psychiatric/Behavioral:  Negative.      Past Medical History:  Diagnosis Date   HSV infection    Nausea    Sickle cell anemia (HCC)    Vitamin B12 deficiency 12/2018   Vitamin D deficiency       No past surgical history on file.   Social History:   Social History   Tobacco Use   Smoking status: Never   Smokeless tobacco: Never  Substance Use Topics   Alcohol use: Not Currently     Lives - At home   Family History :   Family History  Problem Relation Age of Onset   Hypertension Mother    Sickle cell trait Mother    Diabetes Father    Sickle cell trait Father      Home Medications:   Prior to Admission medications   Medication Sig Start Date End Date Taking? Authorizing Provider  crizanlizumab-tmca (ADAKVEO) 100 MG/10ML SOLN Inject 680 mg into the vein every 30 (thirty) days. 07/20/20  Yes Massie Maroon, FNP  folic acid (FOLVITE) 1 MG tablet Take 1 tablet (1 mg total) by mouth daily. 11/15/18  Yes Kallie Locks, FNP  ibuprofen (ADVIL) 800 MG tablet Take 1 tablet (800 mg total) by mouth every 8 (eight) hours as needed for headache, mild pain, moderate pain or cramping. 08/09/20  Yes Massie Maroon, FNP  ondansetron (ZOFRAN) 4 MG tablet Take 1 tablet (4 mg total) by mouth every 8 (eight) hours as needed. 08/09/20  Yes Massie Maroon, FNP  oxyCODONE ER (XTAMPZA ER) 18 MG C12A Take 18 mg by mouth every 12 (twelve) hours. 07/22/20  Yes Massie Maroon, FNP  Oxycodone HCl 20 MG TABS Take 1 tablet (20 mg total) by mouth every 4 (four) hours as needed (pain). 08/01/20  Yes Massie Maroon, FNP  tiZANidine (ZANAFLEX) 4 MG capsule Take 1 capsule (4 mg total) by mouth 3 (three) times daily as needed for muscle spasms. 05/08/20 12/04/20 Yes Kallie Locks, FNP  valACYclovir (VALTREX) 500 MG tablet Take Valtrex 500 mg daily for suppression of outbreaks. Take Valtrex 1000 mg daily X 1 week for outbreaks. Patient taking differently: Take 500 mg by mouth daily. 10/20/19  Yes Kallie Locks, FNP   Vitamin D, Ergocalciferol, (DRISDOL) 1.25 MG (50000 UT) CAPS capsule Take 1 capsule (50,000 Units total) by mouth every 7 (seven) days. 12/21/18   Kallie Locks, FNP     Allergies:   No Known Allergies   Physical Exam:   Vitals:   Vitals:   08/13/20 1400 08/13/20 1500  BP: (!) 134/123 122/61  Pulse: 91 94  Resp: 16 18  Temp:    SpO2: 96% 99%    Physical Exam: Constitutional: Patient appears well-developed and well-nourished. Not in obvious distress. HENT: Normocephalic, atraumatic, External right and left ear normal. Oropharynx is clear and moist.  Eyes: Conjunctivae and EOM are normal. PERRLA, no scleral icterus. Neck: Normal ROM. Neck supple. No JVD. No tracheal deviation. No thyromegaly. CVS: RRR, S1/S2 +, no murmurs, no gallops, no carotid bruit.  Pulmonary: Effort and breath sounds normal, no stridor, rhonchi, wheezes, rales.  Abdominal: Soft. BS +, no distension, tenderness, rebound or guarding.  Musculoskeletal: Normal range of motion. No edema and no tenderness.  Lymphadenopathy: No lymphadenopathy noted, cervical, inguinal or axillary Neuro: Alert. Normal reflexes, muscle tone coordination. No cranial nerve deficit. Skin: Skin is warm and dry. No rash noted. Not diaphoretic. No erythema. No pallor. Psychiatric: Normal mood and affect. Behavior, judgment, thought content normal.   Data Review:   CBC Recent Labs  Lab 08/13/20 1049  WBC 9.5  HGB 13.1  HCT 37.8  PLT 292  MCV 66.5*  MCH 23.1*  MCHC 34.7  RDW 15.8*  LYMPHSABS 1.8  MONOABS 0.5  EOSABS 0.1  BASOSABS 0.0   ------------------------------------------------------------------------------------------------------------------  Chemistries  Recent Labs  Lab 08/13/20 1049  NA 141  K 4.1  CL 112*  CO2 22  GLUCOSE 111*  BUN 7  CREATININE 0.64  CALCIUM 9.5  AST 13*  ALT 16  ALKPHOS 79  BILITOT 0.9    ------------------------------------------------------------------------------------------------------------------ CrCl cannot be calculated (Unknown ideal weight.). ------------------------------------------------------------------------------------------------------------------ No results for input(s): TSH, T4TOTAL, T3FREE, THYROIDAB in the last 72 hours.  Invalid input(s): FREET3  Coagulation profile No results for input(s): INR, PROTIME in the last 168 hours. ------------------------------------------------------------------------------------------------------------------- No results for input(s): DDIMER in the last 72 hours. -------------------------------------------------------------------------------------------------------------------  Cardiac Enzymes No results for input(s): CKMB, TROPONINI, MYOGLOBIN in the last 168 hours.  Invalid input(s): CK ------------------------------------------------------------------------------------------------------------------ No results found for: BNP  ---------------------------------------------------------------------------------------------------------------  Urinalysis    Component Value Date/Time   COLORURINE YELLOW 06/12/2020 1025   APPEARANCEUR CLEAR 06/12/2020 1025   LABSPEC 1.011 06/12/2020 1025   PHURINE 6.0 06/12/2020 1025   GLUCOSEU NEGATIVE 06/12/2020 1025   HGBUR NEGATIVE 06/12/2020 1025   BILIRUBINUR NEGATIVE 06/12/2020 1025   BILIRUBINUR Negative 04/27/2019 0935   KETONESUR NEGATIVE 06/12/2020 1025   PROTEINUR NEGATIVE 06/12/2020 1025   UROBILINOGEN 1.0 04/27/2019 0935   NITRITE NEGATIVE 06/12/2020  1025   LEUKOCYTESUR NEGATIVE 06/12/2020 1025    ----------------------------------------------------------------------------------------------------------------   Imaging Results:    No results found.   Assessment & Plan:  Principal Problem:   Sickle cell pain crisis (HCC) Active Problems:   Chronic pain  syndrome  Sickle cell disease with pain crisis: Admit to MedSurg.  Initiate IV fluids, 0.45% saline at 100 mL/h IV Dilaudid PCA with settings of 0.5 mg, 10-minute lockout, and 3 mg/h OxyContin 20 mg every 12 hours Toradol 15 mg IV every 6 hours Monitor vital signs very closely, reevaluate pain scale regularly, and supplemental oxygen as needed. Patient will be reevaluated for pain in the context of function and relationship to baseline as care progresses  Chronic pain syndrome: Patient highly opiate tolerant.  Restart OxyContin 20 mg every 12 hours Hold oxycodone, use PCA Dilaudid.  Will restart oxycodone as pain improves.  Morbid obesity: Patient counseled extensively on the importance of low-fat, low carbohydrate diet.  Patient has been unable to exercise due to chronic pain.  Recommend heart healthy diet  DVT Prophylaxis: Subcut Lovenox   AM Labs Ordered, also please review Full Orders  Family Communication: Admission, patient's condition and plan of care including tests being ordered have been discussed with the patient who indicate understanding and agree with the plan and Code Status.  Code Status: Full Code  Consults called: None    Admission status: Inpatient    Time spent in minutes : 50 minutes  Nolon Nations  APRN, MSN, FNP-C Patient Care [facility] [address] [place], [place] [address] [phone]  08/13/2020 at 3:18 PM

## 2020-08-13 NOTE — ED Notes (Signed)
Quentin Angst, MD made aware of the following via secure chat "Pt IV infiltrated. Pt was not able to get pain medication. IV team paged. Is there something else the patient can receive until IV team comes?"

## 2020-08-13 NOTE — ED Notes (Signed)
"  Pt IV infiltrated. Pt was not able to get pain medication. IV team paged. Is there something else the patient can receive until IV team comes?"

## 2020-08-13 NOTE — ED Provider Notes (Signed)
[facility]   CSN: [id number] Arrival date & time: 08/13/20  1007     History Chief Complaint  Patient presents with   Sickle Cell Pain Crisis    Stephanie Burch is a 40 y.o. female history of sickle cell anemia, obesity, HSV.  Patient presents today for sickle cell pain crisis, she reports severe constant pain of her bilateral legs, aching/throbbing, no clear aggravating factors, no alleviating factors.  She is attempted her home opioids for her pain without improvement.  She reports this pain is exactly similar to previous sickle cell crisis flareups without abnormal features.  Denies fall/injury, fever/chills, numbness/weakness, tingling, extremity swelling/color change, abdominal pain, nausea/vomiting, chest pain/shortness of breath or any additional concerns. HPI     Past Medical History:  Diagnosis Date   HSV infection    Nausea    Sickle cell anemia (HCC)    Vitamin B12 deficiency 12/2018   Vitamin D deficiency     Patient Active Problem List   Diagnosis Date Noted   Sickle cell anemia with crisis (HCC) 06/12/2020   Pulmonary edema 11/11/2019   Hypoxia    Poor venous access 11/09/2019   HCAP (healthcare-associated pneumonia) 11/07/2019   Sepsis (HCC) 11/07/2019   Acute kidney injury (HCC) 11/07/2019   History of COVID-19 11/07/2019   COVID-19 virus detected 10/22/2019   Sore throat due to virus 10/22/2019   Urinary frequency 04/20/2019   Vaginal odor 04/20/2019   Chest pain varying with breathing    Fever and chills    Vitamin D deficiency 11/15/2018   Nausea 11/15/2018   Abnormal pulse oximetry    Hypokalemia 09/20/2018   Medication management 09/14/2018   Muscle spasms of both lower extremities 08/25/2018   Subjective visual disturbance of right eye 08/25/2018   Hb-SS disease without crisis (HCC) 04/13/2018   Chronic pain syndrome 03/16/2018   Chronic, continuous use of opioids 03/16/2018   Tachycardia  with heart rate 100-120 beats per minute    Sickle cell crisis (HCC) 10/19/2017   Leukocytosis 10/19/2017   Thrombocytopenia (HCC) 10/19/2017   Morbid obesity with BMI of 45.0-49.9, adult (HCC) 10/19/2017   Sickle cell pain crisis (HCC) 10/15/2017    No past surgical history on file.   OB History     Gravida  3   Para      Term      Preterm      AB      Living  3      SAB      IAB      Ectopic      Multiple      Live Births              Family History  Problem Relation Age of Onset   Hypertension Mother    Sickle cell trait Mother    Diabetes Father    Sickle cell trait Father     Social History   Tobacco Use   Smoking status: Never   Smokeless tobacco: Never  Vaping Use   Vaping Use: Never used  Substance Use Topics   Alcohol use: Not Currently   Drug use: Never    Home Medications Prior to Admission medications   Medication Sig Start Date End Date Taking? Authorizing Provider  crizanlizumab-tmca (ADAKVEO) 100 MG/10ML SOLN Inject 680 mg into the vein every 30 (thirty) days. 07/20/20  Yes Massie MaroonHollis, Lachina M, FNP  folic acid (FOLVITE) 1 MG tablet Take 1 tablet (1 mg total)  by mouth daily. 11/15/18  Yes Kallie Locks, FNP  ibuprofen (ADVIL) 800 MG tablet Take 1 tablet (800 mg total) by mouth every 8 (eight) hours as needed for headache, mild pain, moderate pain or cramping. 08/09/20  Yes Massie Maroon, FNP  ondansetron (ZOFRAN) 4 MG tablet Take 1 tablet (4 mg total) by mouth every 8 (eight) hours as needed. 08/09/20  Yes Massie Maroon, FNP  oxyCODONE ER [facility] ER) 18 MG C12A Take 18 mg by mouth every 12 (twelve) hours. 07/22/20  Yes Massie Maroon, FNP  Oxycodone HCl 20 MG TABS Take 1 tablet (20 mg total) by mouth every 4 (four) hours as needed (pain). 08/01/20  Yes Massie Maroon, FNP  tiZANidine (ZANAFLEX) 4 MG capsule Take 1 capsule (4 mg total) by mouth 3 (three) times daily as needed for muscle spasms. 05/08/20 12/04/20 Yes Kallie Locks, FNP  valACYclovir (VALTREX) 500 MG tablet Take Valtrex 500 mg daily for suppression of outbreaks. Take Valtrex 1000 mg daily X 1 week for outbreaks. Patient taking differently: Take 500 mg by mouth daily. 10/20/19  Yes Kallie Locks, FNP  Vitamin D, Ergocalciferol, (DRISDOL) 1.25 MG (50000 UT) CAPS capsule Take 1 capsule (50,000 Units total) by mouth every 7 (seven) days. 12/21/18   Kallie Locks, FNP    Allergies    Patient has no known allergies.  Review of Systems   Review of Systems Ten systems are reviewed and are negative for acute change except as noted in the HPI  Physical Exam Updated Vital Signs BP (!) 134/123   Pulse 91   Temp 99 F (37.2 C)   Resp 16   LMP 07/18/2020   SpO2 96%   Physical Exam Constitutional:      General: She is not in acute distress.    Appearance: Normal appearance. She is well-developed. She is not ill-appearing or diaphoretic.  HENT:     Head: Normocephalic and atraumatic.  Eyes:     General: Vision grossly intact. Gaze aligned appropriately.     Pupils: Pupils are equal, round, and reactive to light.  Neck:     Trachea: Trachea and phonation normal.  Cardiovascular:     Rate and Rhythm: Normal rate and regular rhythm.     Pulses:          Dorsalis pedis pulses are 2+ on the right side and 2+ on the left side.  Pulmonary:     Effort: Pulmonary effort is normal. No respiratory distress.  Abdominal:     General: There is no distension.     Palpations: Abdomen is soft.     Tenderness: There is no abdominal tenderness. There is no guarding or rebound.  Musculoskeletal:        General: Normal range of motion.     Cervical back: Normal range of motion.     Comments: No midline C/T/L spinal tenderness to palpation, no paraspinal muscle tenderness, no deformity, crepitus, or step-off noted. No sign of injury to the neck or back. - Bilateral lower extremities appear symmetric without evidence of trauma.  All major joints of  the bilateral lower extremities mobilized without deformity.  Moves bilateral extremities spontaneously.  Feet:     Right foot:     Protective Sensation: 3 sites tested.  3 sites sensed.     Left foot:     Protective Sensation: 3 sites tested.  3 sites sensed.  Skin:    General: Skin is warm and dry.  Neurological:     Mental Status: She is alert.     GCS: GCS eye subscore is 4. GCS verbal subscore is 5. GCS motor subscore is 6.     Comments: Speech is clear and goal oriented, follows commands Major Cranial nerves without deficit, no facial droop Moves extremities without ataxia, coordination intact  Psychiatric:        Behavior: Behavior normal.    ED Results / Procedures / Treatments   Labs (all labs ordered are listed, but only abnormal results are displayed) Labs Reviewed  RETICULOCYTES - Abnormal; Notable for the following components:      Result Value   RBC. 5.62 (*)    Immature Retic Fract 20.0 (*)    All other components within normal limits  COMPREHENSIVE METABOLIC PANEL - Abnormal; Notable for the following components:   Chloride 112 (*)    Glucose, Bld 111 (*)    AST 13 (*)    All other components within normal limits  CBC WITH DIFFERENTIAL/PLATELET - Abnormal; Notable for the following components:   RBC 5.68 (*)    MCV 66.5 (*)    MCH 23.1 (*)    RDW 15.8 (*)    All other components within normal limits  RESP PANEL BY RT-PCR (FLU A&B, COVID) ARPGX2  I-STAT BETA HCG BLOOD, ED ([facility], [facility], [facility] ONLY)    EKG None  Radiology No results found.  Procedures Procedures   Medications Ordered in ED Medications  HYDROmorphone (DILAUDID) injection 2 mg (2 mg Intravenous Given 08/13/20 1158)  HYDROmorphone (DILAUDID) injection 2 mg (2 mg Intravenous Given 08/13/20 1301)  sodium chloride 0.9 % bolus 500 mL (0 mLs Intravenous Stopped 08/13/20 1301)  ketorolac (TORADOL) 15 MG/ML injection 15 mg (15 mg Intravenous Given 08/13/20 1316)  HYDROmorphone (DILAUDID) injection 2 mg  (2 mg Intravenous Given 08/13/20 1355)    ED Course  I have reviewed the triage vital signs and the nursing notes.  Pertinent labs & imaging results that were available during my care of the patient were reviewed by me and considered in my medical decision making (see chart for details).    MDM Rules/Calculators/A&P                         Additional history obtained from: Nursing notes from this visit. Review of EMR. -------------------------- I ordered, reviewed and interpreted labs which include: Pregnancy test negative. CBC shows no leukocytosis to suggest infection, no anemia or thrombocytopenia. CMP shows no emergent electrolyte derangement, AKI, LFT elevations or gap. Reticulocyte count within normal limits.  Suspect patient symptoms due to her sickle cell pain crisis, low suspicion for infection, rhabdomyolysis, DVT, ACS or other emergent pathologies.  Patient reassessed, she is reports continued severe pain.  Vital signs are stable.  No acute distress.  Patient does not feel comfortable with discharge as she feels her pain will be worse and cause her to return.  Will consult for admission  3:09 PM: Consult with [facility] NP with sickle cell team who will see patient for admission.   Note: Portions of this report may have been transcribed using voice recognition software. Every effort was made to ensure accuracy; however, inadvertent computerized transcription errors may still be present. Final Clinical Impression(s) / ED Diagnoses Final diagnoses:  Sickle cell pain crisis [facility])    Rx / DC Orders ED Discharge Orders     None        Bill Salinas, PA-C 08/13/20 1510  Pollyann Savoy, MD 08/13/20 1534

## 2020-08-14 DIAGNOSIS — D5700 Hb-SS disease with crisis, unspecified: Secondary | ICD-10-CM | POA: Diagnosis not present

## 2020-08-14 NOTE — Progress Notes (Signed)
Subjective: Stephanie Burch is a 40 year old female with a medical history significant for sickle cell disease, chronic pain syndrome, opiate dependence and tolerance, morbid obesity, and history of anemia of chronic disease was admitted for sickle cell pain crisis. Patient continues to have significant pain to bilateral lower extremities.  She is very tearful today.  She rates her pain as 9/10.  She denies any headache, chest pain, urinary symptoms, nausea, vomiting, or diarrhea.  Objective:  Vital signs in last 24 hours:  Vitals:   08/13/20 2328 08/14/20 0438 08/14/20 0442 08/14/20 0724  BP: (!) 145/96 123/86    Pulse: (!) 109 89    Resp: 18 20 16 16   Temp: 98.4 F (36.9 C) 97.9 F (36.6 C)    TempSrc: Oral Oral    SpO2: 100% 96% 97% 97%    Intake/Output from previous day:   Intake/Output Summary (Last 24 hours) at 08/14/2020 0846 Last data filed at 08/14/2020 10/15/2020 Gross per 24 hour  Intake 1310.29 ml  Output 1125 ml  Net 185.29 ml    Physical Exam: General: Alert, awake, oriented x3, in no acute distress.  HEENT: [place]/AT PEERL, EOMI Neck: Trachea midline,  no masses, no thyromegal,y no JVD, no carotid bruit OROPHARYNX:  Moist, No exudate/ erythema/lesions.  Heart: Regular rate and rhythm, without murmurs, rubs, gallops, PMI non-displaced, no heaves or thrills on palpation.  Lungs: Clear to auscultation, no wheezing or rhonchi noted. No increased vocal fremitus resonant to percussion  Abdomen: Soft, nontender, nondistended, positive bowel sounds, no masses no hepatosplenomegaly noted..  Neuro: No focal neurological deficits noted cranial nerves II through XII grossly intact. DTRs 2+ bilaterally upper and lower extremities. Strength 5 out of 5 in bilateral upper and lower extremities. Musculoskeletal: No warm swelling or erythema around joints, no spinal tenderness noted. Psychiatric: Patient alert and oriented x3, good insight and cognition, good recent to remote recall. Lymph  node survey: No cervical axillary or inguinal lymphadenopathy noted.  Lab Results:  Basic Metabolic Panel:    Component Value Date/Time   NA 141 08/13/2020 1049   NA 142 09/11/2017 1431   K 4.1 08/13/2020 1049   CL 112 (H) 08/13/2020 1049   CO2 22 08/13/2020 1049   BUN 7 08/13/2020 1049   BUN 8 09/11/2017 1431   CREATININE 0.64 08/13/2020 1049   GLUCOSE 111 (H) 08/13/2020 1049   CALCIUM 9.5 08/13/2020 1049   CBC:    Component Value Date/Time   WBC 9.5 08/13/2020 1049   HGB 13.1 08/13/2020 1049   HGB 13.0 09/11/2017 1431   HCT 37.8 08/13/2020 1049   HCT 38.4 09/11/2017 1431   PLT 292 08/13/2020 1049   PLT 258 09/11/2017 1431   MCV 66.5 (L) 08/13/2020 1049   MCV 75 (L) 09/11/2017 1431   NEUTROABS 6.9 08/13/2020 1049   NEUTROABS 3.9 09/11/2017 1431   LYMPHSABS 1.8 08/13/2020 1049   LYMPHSABS 2.2 09/11/2017 1431   MONOABS 0.5 08/13/2020 1049   EOSABS 0.1 08/13/2020 1049   EOSABS 0.1 09/11/2017 1431   BASOSABS 0.0 08/13/2020 1049   BASOSABS 0.0 09/11/2017 1431    Recent Results (from the past 240 hour(s))  Resp Panel by RT-PCR (Flu A&B, Covid) Nasopharyngeal Swab     Status: None   Collection Time: 08/13/20  2:47 PM   Specimen: Nasopharyngeal Swab; Nasopharyngeal(NP) swabs in vial transport medium  Result Value Ref Range Status   SARS Coronavirus 2 by RT PCR NEGATIVE NEGATIVE Final    Comment: (NOTE) SARS-CoV-2 target nucleic acids  are NOT DETECTED.  The SARS-CoV-2 RNA is generally detectable in upper respiratory specimens during the acute phase of infection. The lowest concentration of SARS-CoV-2 viral copies this assay can detect is 138 copies/mL. A negative result does not preclude SARS-Cov-2 infection and should not be used as the sole basis for treatment or other patient management decisions. A negative result may occur with  improper specimen collection/handling, submission of specimen other than nasopharyngeal swab, presence of viral mutation(s) within  the areas targeted by this assay, and inadequate number of viral copies(<138 copies/mL). A negative result must be combined with clinical observations, patient history, and epidemiological information. The expected result is Negative.  Fact Sheet for Patients:  BloggerCourse.com  Fact Sheet for Healthcare Providers:  SeriousBroker.it  This test is no t yet approved or cleared by the [place] FDA and  has been authorized for detection and/or diagnosis of SARS-CoV-2 by FDA under an Emergency Use Authorization (EUA). This EUA will remain  in effect (meaning this test can be used) for the duration of the COVID-19 declaration under Section 564(b)(1) of the Act, 21 U.S.C.section 360bbb-3(b)(1), unless the authorization is terminated  or revoked sooner.       Influenza A by PCR NEGATIVE NEGATIVE Final   Influenza B by PCR NEGATIVE NEGATIVE Final    Comment: (NOTE) The Xpert Xpress SARS-CoV-2/FLU/RSV plus assay is intended as an aid in the diagnosis of influenza from Nasopharyngeal swab specimens and should not be used as a sole basis for treatment. Nasal washings and aspirates are unacceptable for Xpert Xpress SARS-CoV-2/FLU/RSV testing.  Fact Sheet for Patients: BloggerCourse.com  Fact Sheet for Healthcare Providers: SeriousBroker.it  This test is not yet approved or cleared by the [place] FDA and has been authorized for detection and/or diagnosis of SARS-CoV-2 by FDA under an Emergency Use Authorization (EUA). This EUA will remain in effect (meaning this test can be used) for the duration of the COVID-19 declaration under Section 564(b)(1) of the Act, 21 U.S.C. section 360bbb-3(b)(1), unless the authorization is terminated or revoked.  Performed at [facility], [address], [place], [place] [address]     Studies/Results: No results  found.  Medications: Scheduled Meds:  enoxaparin (LOVENOX) injection  40 mg Subcutaneous QHS   folic acid  1 mg Oral Daily   HYDROmorphone   Intravenous Q4H   ketorolac  15 mg Intravenous Q6H   oxyCODONE  20 mg Oral Q12H   senna-docusate  1 tablet Oral BID   valACYclovir  500 mg Oral Daily   Continuous Infusions:  sodium chloride 100 mL/hr at 08/14/20 0620   PRN Meds:.diphenhydrAMINE, naloxone **AND** sodium chloride flush, ondansetron (ZOFRAN) IV, polyethylene glycol  Consultants: None  Procedures: None  Antibiotics: None  Assessment/Plan: Principal Problem:   Sickle cell pain crisis (HCC) Active Problems:   Chronic pain syndrome   Sickle cell disease with pain crisis: Decrease IV fluids to KVO Continue IV Dilaudid PCA without any changes Oxycodone 20 mg every 4 hours as needed for severe breakthrough pain Monitor vital signs very closely, reevaluate pain scale regularly, and supplemental oxygen as needed  Chronic pain syndrome: Continue home medications  Morbid obesity: Patient counseled extensively on the importance of a low-fat, low-carb diet and very low impact cardiovascular exercise routine.  Patient has been unable to exercise due to chronic pain    Code Status: Full Code Family Communication: N/A Disposition Plan: Not yet ready for discharge   Stephanie Burch Stephanie Petty  APRN, MSN, FNP-C Patient Care  [facility] [address] [place], [place] [address] [phone]  If 5PM-8AM, please contact night-coverage.  08/14/2020, 8:46 AM  LOS: 1 day

## 2020-08-15 DIAGNOSIS — D5700 Hb-SS disease with crisis, unspecified: Secondary | ICD-10-CM | POA: Diagnosis not present

## 2020-08-15 MED ORDER — FENTANYL CITRATE (PF) 100 MCG/2ML IJ SOLN
12.5000 ug | Freq: Once | INTRAMUSCULAR | Status: AC
  Administered 2020-08-15: 12.5 ug via INTRAVENOUS
  Filled 2020-08-15: qty 2

## 2020-08-15 MED ORDER — OXYCODONE HCL 5 MG PO TABS
20.0000 mg | ORAL_TABLET | ORAL | Status: DC | PRN
  Administered 2020-08-15 – 2020-08-16 (×5): 20 mg via ORAL
  Filled 2020-08-15 (×5): qty 4

## 2020-08-15 MED ORDER — HYDROMORPHONE 1 MG/ML IV SOLN
INTRAVENOUS | Status: DC
  Administered 2020-08-15: 5 mg via INTRAVENOUS
  Administered 2020-08-15: 8 mg via INTRAVENOUS
  Administered 2020-08-16: 6 mg via INTRAVENOUS
  Administered 2020-08-16: 2.5 mg via INTRAVENOUS
  Administered 2020-08-16: 6.5 mg via INTRAVENOUS
  Administered 2020-08-16: 4.5 mg via INTRAVENOUS
  Filled 2020-08-15: qty 30

## 2020-08-15 NOTE — Progress Notes (Signed)
Subjective: Stephanie Burch is a 40 year old female with a medical histoy significant for sickle cell disease, chronic pain syndrome, opiate dependence and tolerance, morbid obesity, and history of chronic disease was admitted for sickle cell pain crisis. Patient has no new complaints on today.  Pain continues to be localized to bilateral lower extremities.  She rates pain as 7/10, which is somewhat improved from yesterday.  Patient denies any headache, chest pain, shortness of breath, urinary symptoms, nausea, vomiting, or diarrhea.  Objective:  Vital signs in last 24 hours:  Vitals:   08/15/20 1034 08/15/20 1248 08/15/20 1454 08/15/20 1625  BP: (!) 130/100  (!) 143/95   Pulse: 95  96   Resp: 15 16 14 14   Temp: 98.2 F (36.8 C)  97.9 F (36.6 C)   TempSrc: Oral  Oral   SpO2: 96% 97% 100% 100%    Intake/Output from previous day:   Intake/Output Summary (Last 24 hours) at 08/15/2020 1735 Last data filed at 08/15/2020 0900 Gross per 24 hour  Intake 1471.16 ml  Output --  Net 1471.16 ml    Physical Exam: General: Alert, awake, oriented x3, in no acute distress.  HEENT: [place]/AT PEERL, EOMI Neck: Trachea midline,  no masses, no thyromegal,y no JVD, no carotid bruit OROPHARYNX:  Moist, No exudate/ erythema/lesions.  Heart: Regular rate and rhythm, without murmurs, rubs, gallops, PMI non-displaced, no heaves or thrills on palpation.  Lungs: Clear to auscultation, no wheezing or rhonchi noted. No increased vocal fremitus resonant to percussion  Abdomen: Soft, nontender, nondistended, positive bowel sounds, no masses no hepatosplenomegaly noted..  Neuro: No focal neurological deficits noted cranial nerves II through XII grossly intact. DTRs 2+ bilaterally upper and lower extremities. Strength 5 out of 5 in bilateral upper and lower extremities. Musculoskeletal: No warm swelling or erythema around joints, no spinal tenderness noted. Psychiatric: Patient alert and oriented x3, good insight  and cognition, good recent to remote recall. Lymph node survey: No cervical axillary or inguinal lymphadenopathy noted.  Lab Results:  Basic Metabolic Panel:    Component Value Date/Time   NA 141 08/13/2020 1049   NA 142 09/11/2017 1431   K 4.1 08/13/2020 1049   CL 112 (H) 08/13/2020 1049   CO2 22 08/13/2020 1049   BUN 7 08/13/2020 1049   BUN 8 09/11/2017 1431   CREATININE 0.64 08/13/2020 1049   GLUCOSE 111 (H) 08/13/2020 1049   CALCIUM 9.5 08/13/2020 1049   CBC:    Component Value Date/Time   WBC 9.5 08/13/2020 1049   HGB 13.1 08/13/2020 1049   HGB 13.0 09/11/2017 1431   HCT 37.8 08/13/2020 1049   HCT 38.4 09/11/2017 1431   PLT 292 08/13/2020 1049   PLT 258 09/11/2017 1431   MCV 66.5 (L) 08/13/2020 1049   MCV 75 (L) 09/11/2017 1431   NEUTROABS 6.9 08/13/2020 1049   NEUTROABS 3.9 09/11/2017 1431   LYMPHSABS 1.8 08/13/2020 1049   LYMPHSABS 2.2 09/11/2017 1431   MONOABS 0.5 08/13/2020 1049   EOSABS 0.1 08/13/2020 1049   EOSABS 0.1 09/11/2017 1431   BASOSABS 0.0 08/13/2020 1049   BASOSABS 0.0 09/11/2017 1431    Recent Results (from the past 240 hour(s))  Resp Panel by RT-PCR (Flu A&B, Covid) Nasopharyngeal Swab     Status: None   Collection Time: 08/13/20  2:47 PM   Specimen: Nasopharyngeal Swab; Nasopharyngeal(NP) swabs in vial transport medium  Result Value Ref Range Status   SARS Coronavirus 2 by RT PCR NEGATIVE NEGATIVE Final  Comment: (NOTE) SARS-CoV-2 target nucleic acids are NOT DETECTED.  The SARS-CoV-2 RNA is generally detectable in upper respiratory specimens during the acute phase of infection. The lowest concentration of SARS-CoV-2 viral copies this assay can detect is 138 copies/mL. A negative result does not preclude SARS-Cov-2 infection and should not be used as the sole basis for treatment or other patient management decisions. A negative result may occur with  improper specimen collection/handling, submission of specimen other than  nasopharyngeal swab, presence of viral mutation(s) within the areas targeted by this assay, and inadequate number of viral copies(<138 copies/mL). A negative result must be combined with clinical observations, patient history, and epidemiological information. The expected result is Negative.  Fact Sheet for Patients:  BloggerCourse.com  Fact Sheet for Healthcare Providers:  SeriousBroker.it  This test is no t yet approved or cleared by the [place] FDA and  has been authorized for detection and/or diagnosis of SARS-CoV-2 by FDA under an Emergency Use Authorization (EUA). This EUA will remain  in effect (meaning this test can be used) for the duration of the COVID-19 declaration under Section 564(b)(1) of the Act, 21 U.S.C.section 360bbb-3(b)(1), unless the authorization is terminated  or revoked sooner.       Influenza A by PCR NEGATIVE NEGATIVE Final   Influenza B by PCR NEGATIVE NEGATIVE Final    Comment: (NOTE) The Xpert Xpress SARS-CoV-2/FLU/RSV plus assay is intended as an aid in the diagnosis of influenza from Nasopharyngeal swab specimens and should not be used as a sole basis for treatment. Nasal washings and aspirates are unacceptable for Xpert Xpress SARS-CoV-2/FLU/RSV testing.  Fact Sheet for Patients: BloggerCourse.com  Fact Sheet for Healthcare Providers: SeriousBroker.it  This test is not yet approved or cleared by the [place] FDA and has been authorized for detection and/or diagnosis of SARS-CoV-2 by FDA under an Emergency Use Authorization (EUA). This EUA will remain in effect (meaning this test can be used) for the duration of the COVID-19 declaration under Section 564(b)(1) of the Act, 21 U.S.C. section 360bbb-3(b)(1), unless the authorization is terminated or revoked.  Performed at [facility], [address] [place], [place] [address]     Studies/Results: No results found.  Medications: Scheduled Meds:  enoxaparin (LOVENOX) injection  40 mg Subcutaneous QHS   folic acid  1 mg Oral Daily   HYDROmorphone   Intravenous Q4H   ketorolac  15 mg Intravenous Q6H   oxyCODONE  20 mg Oral Q12H   senna-docusate  1 tablet Oral BID   valACYclovir  500 mg Oral Daily   Continuous Infusions: PRN Meds:.diphenhydrAMINE, naloxone **AND** sodium chloride flush, ondansetron (ZOFRAN) IV, oxyCODONE, polyethylene glycol  Consultants: None  Procedures: None  Antibiotics: None  Assessment/Plan: Principal Problem:   Sickle cell pain crisis (HCC) Active Problems:   Chronic pain syndrome  Sickle cell disease with pain crisis: Discontinue IV fluids Weaning IV Dilaudid PCA, decrease settings today Oxycodone 20 mg every 4 hours as needed for severe breakthrough pain Monitor vital signs very closely, reevaluate pain scale regularly, and supplemental oxygen as needed.  Chronic pain syndrome: Continue home medications  Morbid obesity: Patient counseled extensively on the importance of low-fat, low carbohydrate diet.   Code Status: Full Code Family Communication: N/A Disposition Plan: Not yet ready for discharge.  Discharge planned for 08/16/2020  Nolon Nations  APRN, MSN, FNP-C Patient Care [facility] [address] [place], [place] [address] [phone]  If 5PM-8AM, please contact night-coverage.  08/15/2020, 5:35 PM  LOS: 2 days

## 2020-08-16 DIAGNOSIS — D5700 Hb-SS disease with crisis, unspecified: Secondary | ICD-10-CM | POA: Diagnosis not present

## 2020-08-16 MED ORDER — OXYCODONE HCL 20 MG PO TABS: 20.0000 mg | ORAL_TABLET | ORAL | 0 refills | Status: DC | PRN

## 2020-08-16 NOTE — Discharge Summary (Signed)
Physician Discharge Summary  Trinadee Verhagen EXN:[id number] DOB: 29-Mar-1980 DOA: 08/13/2020  PCP: Kallie Locks, FNP (Inactive)  Admit date: 08/13/2020  Discharge date: 08/16/2020  Discharge Diagnoses:  Principal Problem:   Sickle cell pain crisis [facility]) Active Problems:   Chronic pain syndrome   Discharge Condition: Stable  Disposition:   Follow-up Information     Massie Maroon, FNP Follow up on 08/21/2020.   Specialty: Family Medicine Contact information: [address] [place] [address] [phone]                Pt is discharged home in good condition and is to follow up with Kallie Locks, FNP (Inactive) this week to have labs evaluated. Markiyah Wunder is instructed to increase activity slowly and balance with rest for the next few days, and use prescribed medication to complete treatment of pain  Diet: Regular Wt Readings from Last 3 Encounters:  06/16/20 128.9 kg  05/19/20 127 kg  04/26/20 136.1 kg    History of present illness:  Leighanna Kirn  is a 40 y.o. female with a medical history significant for sickle cell disease, chronic pain syndrome, opiate dependence and tolerance, morbid obesity, and history of anemia of chronic disease presents to the emergency department with a 2-day history of bilateral lower extremity pain that is consistent with her previous pain crisis.  Patient now weightbearing 2 days prior to admission with severe pain to lower extremities that was unrelieved by home medications.  She attributes current pain crisis to changes in weather.  She has been taking Xtampza ER and oxycodone consistently without sustained relief.  She characterizes lower extremity pain as constant and sharp.  She denies any fever, chills, chest pain, or shortness of breath.  No headache, dizziness, nausea, vomiting, or diarrhea.  No sick contacts, recent travel, or known exposure to COVID-19 infection.   ER Course:  While in ER, patient's vital  signs remained stable. BP 122/61   Pulse 94   Temp 99 F (37.2 C)   Resp 18   LMP 07/18/2020   SpO2 99%  Complete metabolic panel and complete blood count largely within normal limits.  COVID-19 test negative.  Patient's pain persists despite IV fluids, IV Toradol, and IV Dilaudid.  Patient admitted to MedSurg for further management of sickle cell pain crisis.  Hospital Course:  Sickle cell disease with pain crisis:  Patient was admitted for sickle cell pain crisis and managed appropriately with IVF, IV Dilaudid via PCA and IV Toradol, as well as other adjunct therapies per sickle cell pain management protocols. Pain intensity decreased throughout admission.  She currently rates pain as 6/10.  She is requesting discharge home today.  She feels that she can manage at home on current medication regimen.  Patient inquiring about home prescription.  Oxycodone 20 mg every 4 hours as needed for severe breakthrough pain #90 was sent to patient's pharmacy.  [place] substance reporting system was reviewed prior to prescribing opiate medications, no inconsistencies noted. Patient will follow-up with this provider on 08/21/2020 for medication management.  Also, will repeat CBC with differential and BMP at that time. Patient will continue Adakveo, which is scheduled on 08/21/2020. Patient is alert, oriented, and ambulating without assistance.  She will discharge home in a hemodynamically stable condition Monserratt will follow-up with PCP within 1 week of this discharge. Brittain was counseled extensively about nonpharmacologic means of pain management, patient verbalized understanding and was appreciative of  the care received during  this admission.   We discussed the need for good hydration, monitoring of hydration status, avoidance of heat, cold, stress, and infection triggers. We discussed the need to be adherent with taking Hydrea and other home medications. Patient was reminded of the need to seek  medical attention immediately if any symptom of bleeding, anemia, or infection occurs.  Discharge Exam: Vitals:   08/16/20 1001 08/16/20 1227  BP: 128/73   Pulse: 93   Resp: 19 18  Temp: 98.2 F (36.8 C)   SpO2: 99% 98%   Vitals:   08/16/20 0548 08/16/20 0718 08/16/20 1001 08/16/20 1227  BP: (!) 149/94  128/73   Pulse: (!) 106  93   Resp: 20 18 19 18   Temp:   98.2 F (36.8 C)   TempSrc:   Oral   SpO2: 95% 96% 99% 98%    General appearance : Awake, alert, not in any distress.  Morbid obesitySpeech Clear. Not toxic looking HEENT: Atraumatic and Normocephalic, pupils equally reactive to light and accomodation Neck: Supple, no JVD. No cervical lymphadenopathy.  Chest: Good air entry bilaterally, no added sounds  CVS: S1 S2 regular, no murmurs.  Abdomen: Bowel sounds present, Non tender and not distended with no guarding, rigidity or rebound. Extremities: B/L Lower Ext shows no edema, both legs are warm to touch Neurology: Awake alert, and oriented X 3, CN II-XII intact, Non focal Skin: No Rash  Discharge Instructions  Discharge Instructions     Discharge patient   Complete by: As directed    Discharge disposition: 01-Home or Self Care   Discharge patient date: 08/16/2020      Allergies as of 08/16/2020   No Known Allergies      Medication List     TAKE these medications    crizanlizumab-tmca 100 MG/10ML Soln Commonly known as: ADAKVEO Inject 680 mg into the vein every 30 (thirty) days.   folic acid 1 MG tablet Commonly known as: FOLVITE Take 1 tablet (1 mg total) by mouth daily.   ibuprofen 800 MG tablet Commonly known as: ADVIL Take 1 tablet (800 mg total) by mouth every 8 (eight) hours as needed for headache, mild pain, moderate pain or cramping.   ondansetron 4 MG tablet Commonly known as: ZOFRAN Take 1 tablet (4 mg total) by mouth every 8 (eight) hours as needed.   Oxycodone HCl 20 MG Tabs Take 1 tablet (20 mg total) by mouth every 4 (four) hours  as needed (pain).   tiZANidine 4 MG capsule Commonly known as: ZANAFLEX Take 1 capsule (4 mg total) by mouth 3 (three) times daily as needed for muscle spasms.   Vitamin D (Ergocalciferol) 1.25 MG (50000 UNIT) Caps capsule Commonly known as: DRISDOL Take 1 capsule (50,000 Units total) by mouth every 7 (seven) days.   Xtampza ER 18 MG C12a Generic drug: oxyCODONE ER Take 18 mg by mouth every 12 (twelve) hours.       ASK your doctor about these medications    valACYclovir 500 MG tablet Commonly known as: Valtrex Take Valtrex 500 mg daily for suppression of outbreaks. Take Valtrex 1000 mg daily X 1 week for outbreaks.        The results of significant diagnostics from this hospitalization (including imaging, microbiology, ancillary and laboratory) are listed below for reference.    Significant Diagnostic Studies: No results found.  Microbiology: Recent Results (from the past 240 hour(s))  Resp Panel by RT-PCR (Flu A&B, Covid) Nasopharyngeal Swab     Status: None  Collection Time: 08/13/20  2:47 PM   Specimen: Nasopharyngeal Swab; Nasopharyngeal(NP) swabs in vial transport medium  Result Value Ref Range Status   SARS Coronavirus 2 by RT PCR NEGATIVE NEGATIVE Final    Comment: (NOTE) SARS-CoV-2 target nucleic acids are NOT DETECTED.  The SARS-CoV-2 RNA is generally detectable in upper respiratory specimens during the acute phase of infection. The lowest concentration of SARS-CoV-2 viral copies this assay can detect is 138 copies/mL. A negative result does not preclude SARS-Cov-2 infection and should not be used as the sole basis for treatment or other patient management decisions. A negative result may occur with  improper specimen collection/handling, submission of specimen other than nasopharyngeal swab, presence of viral mutation(s) within the areas targeted by this assay, and inadequate number of viral copies(<138 copies/mL). A negative result must be combined  with clinical observations, patient history, and epidemiological information. The expected result is Negative.  Fact Sheet for Patients:  BloggerCourse.com  Fact Sheet for Healthcare Providers:  SeriousBroker.it  This test is no t yet approved or cleared by the [place] FDA and  has been authorized for detection and/or diagnosis of SARS-CoV-2 by FDA under an Emergency Use Authorization (EUA). This EUA will remain  in effect (meaning this test can be used) for the duration of the COVID-19 declaration under Section 564(b)(1) of the Act, 21 U.S.C.section 360bbb-3(b)(1), unless the authorization is terminated  or revoked sooner.       Influenza A by PCR NEGATIVE NEGATIVE Final   Influenza B by PCR NEGATIVE NEGATIVE Final    Comment: (NOTE) The Xpert Xpress SARS-CoV-2/FLU/RSV plus assay is intended as an aid in the diagnosis of influenza from Nasopharyngeal swab specimens and should not be used as a sole basis for treatment. Nasal washings and aspirates are unacceptable for Xpert Xpress SARS-CoV-2/FLU/RSV testing.  Fact Sheet for Patients: BloggerCourse.com  Fact Sheet for Healthcare Providers: SeriousBroker.it  This test is not yet approved or cleared by the [place] FDA and has been authorized for detection and/or diagnosis of SARS-CoV-2 by FDA under an Emergency Use Authorization (EUA). This EUA will remain in effect (meaning this test can be used) for the duration of the COVID-19 declaration under Section 564(b)(1) of the Act, 21 U.S.C. section 360bbb-3(b)(1), unless the authorization is terminated or revoked.  Performed at [facility], [address], [place], [place] [address]      Labs: Basic Metabolic Panel: Recent Labs  Lab 08/13/20 1049  NA 141  K 4.1  CL 112*  CO2 22  GLUCOSE 111*  BUN 7  CREATININE 0.64  CALCIUM 9.5    Liver Function Tests: Recent Labs  Lab 08/13/20 1049  AST 13*  ALT 16  ALKPHOS 79  BILITOT 0.9  PROT 7.9  ALBUMIN 4.3   No results for input(s): LIPASE, AMYLASE in the last 168 hours. No results for input(s): AMMONIA in the last 168 hours. CBC: Recent Labs  Lab 08/13/20 1049  WBC 9.5  NEUTROABS 6.9  HGB 13.1  HCT 37.8  MCV 66.5*  PLT 292   Cardiac Enzymes: No results for input(s): CKTOTAL, CKMB, CKMBINDEX, TROPONINI in the last 168 hours. BNP: Invalid input(s): POCBNP CBG: No results for input(s): GLUCAP in the last 168 hours.  Time coordinating discharge: 30 minutes  Signed:  Nolon Nations  APRN, MSN, FNP-C Patient Care [facility] [address] [place], [place] [address] [phone]  [facility] 08/16/2020, 1:19 PM

## 2020-08-16 NOTE — Care Management Important Message (Signed)
Important Message  Patient Details IM Letter given to the Patient. Name: Stephanie Burch MRN: [id number] Date of Birth: [birth year]   Medicare Important Message Given:  Yes     Caren Macadam 08/16/2020, 10:53 AM

## 2020-08-17 ENCOUNTER — Telehealth: Payer: Self-pay

## 2020-08-17 NOTE — Telephone Encounter (Signed)
Transition Care Management Unsuccessful Follow-up Telephone Call  Date of discharge and from where:  08/16/2020-[facility]  Attempts:  1st Attempt  Reason for unsuccessful TCM follow-up call:  Left voice message    

## 2020-08-19 ENCOUNTER — Other Ambulatory Visit: Payer: Self-pay

## 2020-08-19 DIAGNOSIS — D571 Sickle-cell disease without crisis: Secondary | ICD-10-CM

## 2020-08-19 DIAGNOSIS — F1190 Opioid use, unspecified, uncomplicated: Secondary | ICD-10-CM

## 2020-08-19 DIAGNOSIS — G894 Chronic pain syndrome: Secondary | ICD-10-CM

## 2020-08-20 ENCOUNTER — Other Ambulatory Visit: Payer: Self-pay

## 2020-08-20 DIAGNOSIS — F1190 Opioid use, unspecified, uncomplicated: Secondary | ICD-10-CM

## 2020-08-20 DIAGNOSIS — D571 Sickle-cell disease without crisis: Secondary | ICD-10-CM

## 2020-08-20 DIAGNOSIS — G894 Chronic pain syndrome: Secondary | ICD-10-CM

## 2020-08-20 NOTE — Telephone Encounter (Signed)
Transition Care Management Unsuccessful Follow-up Telephone Call  Date of discharge and from where:  08/16/2020- [facility]  Attempts:  2nd Attempt  Reason for unsuccessful TCM follow-up call:  Left voice message    

## 2020-08-21 ENCOUNTER — Ambulatory Visit (INDEPENDENT_AMBULATORY_CARE_PROVIDER_SITE_OTHER): Payer: Medicare Other | Admitting: Family Medicine

## 2020-08-21 ENCOUNTER — Other Ambulatory Visit: Payer: Self-pay

## 2020-08-21 ENCOUNTER — Telehealth (HOSPITAL_COMMUNITY): Payer: Self-pay

## 2020-08-21 ENCOUNTER — Other Ambulatory Visit: Payer: Self-pay | Admitting: Family Medicine

## 2020-08-21 ENCOUNTER — Encounter: Payer: Self-pay | Admitting: Family Medicine

## 2020-08-21 VITALS — BP 153/94 | HR 81 | Temp 98.4°F | Ht 65.0 in | Wt 311.0 lb

## 2020-08-21 DIAGNOSIS — D571 Sickle-cell disease without crisis: Secondary | ICD-10-CM

## 2020-08-21 DIAGNOSIS — F1190 Opioid use, unspecified, uncomplicated: Secondary | ICD-10-CM

## 2020-08-21 DIAGNOSIS — G894 Chronic pain syndrome: Secondary | ICD-10-CM

## 2020-08-21 DIAGNOSIS — Z23 Encounter for immunization: Secondary | ICD-10-CM

## 2020-08-21 DIAGNOSIS — Z6841 Body Mass Index (BMI) 40.0 and over, adult: Secondary | ICD-10-CM

## 2020-08-21 DIAGNOSIS — R739 Hyperglycemia, unspecified: Secondary | ICD-10-CM

## 2020-08-21 LAB — POCT GLYCOSYLATED HEMOGLOBIN (HGB A1C): Hemoglobin A1C: 4.5 % (ref 4.0–5.6)

## 2020-08-21 MED ORDER — XTAMPZA ER 18 MG PO C12A: 18.0000 mg | EXTENDED_RELEASE_CAPSULE | Freq: Two times a day (BID) | ORAL | 0 refills | Status: DC

## 2020-08-21 MED ORDER — GABAPENTIN 300 MG PO CAPS: 300.0000 mg | ORAL_CAPSULE | Freq: Three times a day (TID) | ORAL | 0 refills | Status: DC

## 2020-08-21 MED ORDER — ACETAMINOPHEN 500 MG PO TABS: 1000.0000 mg | ORAL_TABLET | Freq: Four times a day (QID) | ORAL | 2 refills | Status: DC | PRN

## 2020-08-21 NOTE — Progress Notes (Signed)
Reviewed PDMP substance reporting system prior to prescribing opiate medications. No inconsistencies noted.  °Meds ordered this encounter  °Medications  ° oxyCODONE ER (XTAMPZA ER) 18 MG C12A  °  Sig: Take 18 mg by mouth every 12 (twelve) hours.  °  Dispense:  60 capsule  °  Refill:  0  °  Order Specific Question:   Supervising Provider  °  Answer:   JEGEDE, OLUGBEMIGA E [[id number]]  ° Stephanie Burch Enzio Buchler  APRN, MSN, FNP-C °Patient Care Center °[facility] °[address]  °[place], [place] [address] [phone] ° °

## 2020-08-21 NOTE — Telephone Encounter (Signed)
Patient called in. Complains of pain in legs and L arm rates 7/10. Denied chest pain, abd pain, fever, N/V/D. Pt states she was discharged from the hospital last weeks, has been taking her short acting home meds but is out of her long acting home meds, request placed yesterday. Pt also states she has a primary care appointment scheduled with provider [place] FNP today. Wants to come in for treatment at day hospital. Pt states she uses the transportation service to come to the center. Last took oxycodone 20mg  at 5:00am. Pt denies exposure to anyone covid positive in last two weeks, had a negative covid test done inpatient last week, and denies flu like symptoms today. FNP made aware,  instructed pt to continue taking home medications, refill for long acting medication will be sent to pharmacy today for pickup , and pt is to come to primary care appointment as scheduled, not approved to be seen in day hospital today. Pt notified, verbalized understanding.

## 2020-08-21 NOTE — Progress Notes (Signed)
[facility]   Established Patient Office Visit  Subjective:  Patient ID: Stephanie Burch, female    DOB: July 26, 1980  Age: 40 y.o. MRN: [id number]  CC:  Chief Complaint  Patient presents with   Follow-up    Has been in pain all day rates 7 out of 10.     HPI Gary Bultman is a 40 year old female with a medical history significant for sickle cell disease, chronic pain syndrome, opiate dependence and tolerance, history of vitamin D deficiency, anemia of chronic disease, and history of morbid obesity presents for follow-up of chronic conditions.  Patient also complains that she has been having increased pain primarily to bilateral lower extremities over the past several days that is have been unrelieved by home medications.  Patient last had oxycodone and Xtampza ER this a.m. without sustained relief.  She rates her pain as 7/10 characterized as constant and throbbing.  She denies any chest pain, shortness of breath, fever, or chills.  No urinary symptoms, nausea, vomiting, or diarrhea.  No sick contacts, recent travel, or exposure to COVID-19.  Past Medical History:  Diagnosis Date   HSV infection    Nausea    Sickle cell anemia (HCC)    Vitamin B12 deficiency 12/2018   Vitamin D deficiency     No past surgical history on file.  Family History  Problem Relation Age of Onset   Hypertension Mother    Sickle cell trait Mother    Diabetes Father    Sickle cell trait Father     Social History   Socioeconomic History   Marital status: Single    Spouse name: Not on file   Number of children: Not on file   Years of education: Not on file   Highest education level: Not on file  Occupational History   Not on file  Tobacco Use   Smoking status: Never   Smokeless tobacco: Never  Vaping Use   Vaping Use: Never used  Substance and Sexual Activity   Alcohol use: Not Currently   Drug use: Never   Sexual activity: Yes    Birth  control/protection: None  Other Topics Concern   Not on file  Social History Narrative   Not on file   Social Determinants of Health   Financial Resource Strain: Not on file  Food Insecurity: Not on file  Transportation Needs: Not on file  Physical Activity: Not on file  Stress: Not on file  Social Connections: Not on file  Intimate Partner Violence: Not on file    Outpatient Medications Prior to Visit  Medication Sig Dispense Refill   crizanlizumab-tmca (ADAKVEO) 100 MG/10ML SOLN Inject 680 mg into the vein every 30 (thirty) days. 68 mL 0   folic acid (FOLVITE) 1 MG tablet Take 1 tablet (1 mg total) by mouth daily. 30 tablet 11   ibuprofen (ADVIL) 800 MG tablet Take 1 tablet (800 mg total) by mouth every 8 (eight) hours as needed for headache, mild pain, moderate pain or cramping. 30 tablet 1   ondansetron (ZOFRAN) 4 MG tablet Take 1 tablet (4 mg total) by mouth every 8 (eight) hours as needed. 20 tablet 0   oxyCODONE ER (XTAMPZA ER) 18 MG C12A Take 18 mg by mouth every 12 (twelve) hours. 60 capsule 0   Oxycodone HCl 20 MG TABS Take 1 tablet (20 mg total) by mouth every 4 (four) hours as needed (pain). 90 tablet 0   tiZANidine (ZANAFLEX) 4  MG capsule Take 1 capsule (4 mg total) by mouth 3 (three) times daily as needed for muscle spasms. 90 capsule 6   valACYclovir (VALTREX) 500 MG tablet Take Valtrex 500 mg daily for suppression of outbreaks. Take Valtrex 1000 mg daily X 1 week for outbreaks. (Patient taking differently: Take 500 mg by mouth daily.) 90 tablet 11   Vitamin D, Ergocalciferol, (DRISDOL) 1.25 MG (50000 UT) CAPS capsule Take 1 capsule (50,000 Units total) by mouth every 7 (seven) days. 5 capsule 6   No facility-administered medications prior to visit.    No Known Allergies  ROS Review of Systems  Constitutional:  Negative for activity change and appetite change.  HENT: Negative.    Eyes: Negative.   Respiratory: Negative.    Cardiovascular: Negative.    Gastrointestinal: Negative.   Genitourinary: Negative.   Musculoskeletal:  Positive for arthralgias.  Skin: Negative.      Objective:    Physical Exam Constitutional:      Appearance: Normal appearance.  HENT:     Mouth/Throat:     Mouth: Mucous membranes are moist.  Cardiovascular:     Rate and Rhythm: Normal rate and regular rhythm.     Pulses: Normal pulses.  Pulmonary:     Effort: Pulmonary effort is normal.  Abdominal:     General: Abdomen is flat. Bowel sounds are normal.  Musculoskeletal:        General: Normal range of motion.  Skin:    General: Skin is warm.  Neurological:     General: No focal deficit present.     Mental Status: She is alert. Mental status is at baseline.  Psychiatric:        Mood and Affect: Mood normal.        Behavior: Behavior normal.        Thought Content: Thought content normal.        Judgment: Judgment normal.    BP (!) 153/94 (BP Location: Right Arm, Patient Position: Sitting)   Pulse 81   Temp 98.4 F (36.9 C)   Ht 5\' 5"  (1.651 m)   Wt (!) 311 lb 0.6 oz (141.1 kg)   LMP 08/15/2020   SpO2 100%   BMI 51.76 kg/m  Wt Readings from Last 3 Encounters:  08/21/20 (!) 311 lb 0.6 oz (141.1 kg)  06/16/20 284 lb 2.8 oz (128.9 kg)  05/19/20 279 lb 15.8 oz (127 kg)     Health Maintenance Due  Topic Date Due   COVID-19 Vaccine (1) Never done   Pneumococcal Vaccine 50-81 Years old (1 - PCV) Never done   Hepatitis C Screening  Never done   PAP SMEAR-Modifier  Never done    There are no preventive care reminders to display for this patient.  Lab Results  Component Value Date   TSH 1.120 11/15/2018   Lab Results  Component Value Date   WBC 9.5 08/13/2020   HGB 13.1 08/13/2020   HCT 37.8 08/13/2020   MCV 66.5 (L) 08/13/2020   PLT 292 08/13/2020   Lab Results  Component Value Date   NA 141 08/13/2020   K 4.1 08/13/2020   CO2 22 08/13/2020   GLUCOSE 111 (H) 08/13/2020   BUN 7 08/13/2020   CREATININE 0.64 08/13/2020    BILITOT 0.9 08/13/2020   ALKPHOS 79 08/13/2020   AST 13 (L) 08/13/2020   ALT 16 08/13/2020   PROT 7.9 08/13/2020   ALBUMIN 4.3 08/13/2020   CALCIUM 9.5 08/13/2020   ANIONGAP 7 08/13/2020  Lab Results  Component Value Date   CHOL 84 (L) 11/15/2018   Lab Results  Component Value Date   HDL 33 (L) 11/15/2018   Lab Results  Component Value Date   LDLCALC 34 11/15/2018   Lab Results  Component Value Date   TRIG 149 11/07/2019   Lab Results  Component Value Date   CHOLHDL 2.5 11/15/2018   Lab Results  Component Value Date   HGBA1C 4.1 09/11/2017      Assessment & Plan:   Problem List Items Addressed This Visit       Other   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 Hb-SS disease without crisis (HCC)  - oxyCODONE ER (XTAMPZA ER) 18 MG C12A; Take 18 mg by mouth every 12 (twelve) hours.  Dispense: 60 capsule; Refill: 0  2. Morbid obesity with BMI of 50.0-59.9, adult [facility]) The patient is asked to make an attempt to improve diet and exercise patterns to aid in medical management of this problem.   3. Chronic pain syndrome Reviewed PDMP substance reporting system prior to prescribing opiate medications. No inconsistencies noted.   - [id number] 11+Oxyco+Alc+Crt-Bund - gabapentin (NEURONTIN) 300 MG capsule; Take 1 capsule (300 mg total) by mouth 3 (three) times daily.  Dispense: 90 capsule; Refill: 0 - oxyCODONE ER (XTAMPZA ER) 18 MG C12A; Take 18 mg by mouth every 12 (twelve) hours.  Dispense: 60 capsule; Refill: 0  4. Hyperglycemia  - HgB A1c  5. Chronic, continuous use of opioids  - oxyCODONE ER (XTAMPZA ER) 18 MG C12A; Take 18 mg by mouth every 12 (twelve) hours.  Dispense: 60 capsule; Refill: 0  6. Need for 23-polyvalent pneumococcal  polysaccharide vaccine  - Pneumococcal polysaccharide vaccine 23-valent greater than or equal to 2yo subcutaneous/IM    Follow-up: Return in about 3 months (around 11/21/2020) for sickle cell anemia, gynecological examination.   Nolon Nations  APRN, MSN, FNP-C Patient Care [facility] [address] [place], [place] [address] [phone]

## 2020-08-21 NOTE — Patient Instructions (Signed)
Gabapentin Capsules or Tablets What is this medication? GABAPENTIN (GA ba pen tin) treats nerve pain. It may also be used to prevent and control seizures in people with epilepsy. It works by calming overactivenerves in your body. This medicine may be used for other purposes; ask your health care provider orpharmacist if you have questions. COMMON BRAND NAME(S): Active-PAC with Gabapentin, Gabarone, Gralise, Neurontin What should I tell my care team before I take this medication? They need to know if you have any of these conditions: Alcohol or substance use disorder Kidney disease Lung or breathing disease Suicidal thoughts, plans, or attempt; a previous suicide attempt by you or a family member An unusual or allergic reaction to gabapentin, other medications, foods, dyes, or preservatives Pregnant or trying to get pregnant Breast-feeding How should I use this medication? Take this medication by mouth with a glass of water. Follow the directions on the prescription label. You can take it with or without food. If it upsets your stomach, take it with food. Take your medication at regular intervals. Do not take it more often than directed. Do not stop taking except on your care team'sadvice. If you are directed to break the 600 or 800 mg tablets in half as part of your dose, the extra half tablet should be used for the next dose. If you have notused the extra half tablet within 28 days, it should be thrown away. A special MedGuide will be given to you by the pharmacist with eachprescription and refill. Be sure to read this information carefully each time. Talk to your care team about the use of this medication in children. While this medication may be prescribed for children as young as 3 years for selectedconditions, precautions do apply. Overdosage: If you think you have taken too much of this medicine contact apoison control center or emergency room at once. NOTE: This medicine is only for you.  Do not share this medicine with others. What if I miss a dose? If you miss a dose, take it as soon as you can. If it is almost time for yournext dose, take only that dose. Do not take double or extra doses. What may interact with this medication? Alcohol Antihistamines for allergy, cough, and cold Certain medications for anxiety or sleep Certain medications for depression like amitriptyline, fluoxetine, sertraline Certain medications for seizures like phenobarbital, primidone Certain medications for stomach problems General anesthetics like halothane, isoflurane, methoxyflurane, propofol Local anesthetics like lidocaine, pramoxine, tetracaine Medications that relax muscles for surgery Narcotic medications for pain Phenothiazines like chlorpromazine, mesoridazine, prochlorperazine, thioridazine This list may not describe all possible interactions. Give your health care provider a list of all the medicines, herbs, non-prescription drugs, or dietary supplements you use. Also tell them if you smoke, drink alcohol, or use illegaldrugs. Some items may interact with your medicine. What should I watch for while using this medication? Visit your care team for regular checks on your progress. You may want to keep a record at home of how you feel your condition is responding to treatment. You may want to share this information with your care team at each visit. You should contact your care team if your seizures get worse or if you have any new types of seizures. Do not stop taking this medication or any of your seizure medications unless instructed by your care team. Stopping your medicationsuddenly can increase your seizures or their severity. This medication may cause serious skin reactions. They can happen weeks to months after starting the   medication. Contact your care team right away if you notice fevers or flu-like symptoms with a rash. The rash may be red or purple and then turn into blisters or  peeling of the skin. Or, you might notice a red rash with swelling of the face, lips or lymph nodes in your neck or under yourarms. Wear a medical identification bracelet or chain if you are taking thismedication for seizures. Carry a card that lists all your medications. You may get drowsy, dizzy, or have blurred vision. Do not drive, use machinery, or do anything that needs mental alertness until you know how this medication affects you. To reduce dizzy or fainting spells, do not sit or stand up quickly, especially if you are an older patient. Alcohol can increasedrowsiness and dizziness. Your mouth may get dry. Chewing sugarless gum or sucking hard candy, anddrinking plenty of water may help. Watch for new or worsening thoughts of suicide or depression. This includes sudden changes in mood, behaviors, or thoughts. These changes can happen at any time but are more common in the beginning of treatment or after a change in dose. Call your care team right away if you experience these thoughts orworsening depression. If you become pregnant while using this medication, you may enroll in the [place] American Antiepileptic Drug Pregnancy Registry by calling 1-888-233-2334. This registry collects information about the safety of antiepileptic medication useduring pregnancy. What side effects may I notice from receiving this medication? Side effects that you should report to your care team as soon as possible: Allergic reactions or angioedema-skin rash, itching, hives, swelling of the face, eyes, lips, tongue, arms, or legs, trouble swallowing or breathing Rash, fever, and swollen lymph nodes Thoughts of suicide or self harm, worsening mood, feelings of depression Trouble breathing Unusual changes in mood or behavior in children after use such as difficulty concentrating, hostility, or restlessness Side effects that usually do not require medical attention (report to your careteam if they continue or are  bothersome): Dizziness Drowsiness Nausea Swelling of ankles, feet, or hands Vomiting This list may not describe all possible side effects. Call your doctor for medical advice about side effects. You may report side effects to FDA at1-800-FDA-1088. Where should I keep my medication? Keep out of reach of children and pets. Store at room temperature between 15 and 30 degrees C (59 and 86 degrees F).Get rid of any unused medication after the expiration date. This medication may cause accidental overdose and death if taken by other adults, children, or pets. Mix any unused medication with a substance like cat litter or coffee grounds. Then throw the medication away in a sealed containerlike a sealed bag or a coffee can with a lid. NOTE: This sheet is a summary. It may not cover all possible information. If you have questions about this medicine, talk to your doctor, pharmacist, orhealth care provider.  2022 Elsevier/Gold Standard (2019-12-07 12:16:18)  

## 2020-08-21 NOTE — Telephone Encounter (Signed)
Transition Care Management Unsuccessful Follow-up Telephone Call  Date of discharge and from where:  08/16/2020-[facility]  Attempts:  3rd Attempt  Reason for unsuccessful TCM follow-up call:  Left voice message    

## 2020-08-22 ENCOUNTER — Non-Acute Institutional Stay (HOSPITAL_COMMUNITY)
Admission: AD | Admit: 2020-08-22 | Discharge: 2020-08-22 | Disposition: A | Discharge status: Home / Self Care | Payer: Medicare Other | Source: Ambulatory Visit | Attending: Internal Medicine | Admitting: Internal Medicine

## 2020-08-22 ENCOUNTER — Telehealth (HOSPITAL_COMMUNITY): Payer: Self-pay

## 2020-08-22 DIAGNOSIS — M79604 Pain in right leg: Secondary | ICD-10-CM | POA: Diagnosis not present

## 2020-08-22 DIAGNOSIS — M79605 Pain in left leg: Secondary | ICD-10-CM | POA: Insufficient documentation

## 2020-08-22 DIAGNOSIS — G894 Chronic pain syndrome: Secondary | ICD-10-CM | POA: Diagnosis not present

## 2020-08-22 DIAGNOSIS — D5700 Hb-SS disease with crisis, unspecified: Secondary | ICD-10-CM | POA: Insufficient documentation

## 2020-08-22 DIAGNOSIS — F1120 Opioid dependence, uncomplicated: Secondary | ICD-10-CM | POA: Insufficient documentation

## 2020-08-22 DIAGNOSIS — Z79899 Other long term (current) drug therapy: Secondary | ICD-10-CM | POA: Diagnosis not present

## 2020-08-22 DIAGNOSIS — Z832 Family history of diseases of the blood and blood-forming organs and certain disorders involving the immune mechanism: Secondary | ICD-10-CM | POA: Diagnosis not present

## 2020-08-22 LAB — CBC WITH DIFFERENTIAL/PLATELET
Abs Immature Granulocytes: 0.08 10*3/uL — ABNORMAL HIGH (ref 0.00–0.07)
Basophils Absolute: 0.1 10*3/uL (ref 0.0–0.1)
Basophils Relative: 1 %
Eosinophils Absolute: 0.4 10*3/uL (ref 0.0–0.5)
Eosinophils Relative: 4 %
HCT: 31.4 % — ABNORMAL LOW (ref 36.0–46.0)
Hemoglobin: 10.5 g/dL — ABNORMAL LOW (ref 12.0–15.0)
Immature Granulocytes: 1 %
Lymphocytes Relative: 26 %
Lymphs Abs: 2.8 10*3/uL (ref 0.7–4.0)
MCH: 23 pg — ABNORMAL LOW (ref 26.0–34.0)
MCHC: 33.4 g/dL (ref 30.0–36.0)
MCV: 68.9 fL — ABNORMAL LOW (ref 80.0–100.0)
Monocytes Absolute: 0.7 10*3/uL (ref 0.1–1.0)
Monocytes Relative: 7 %
Neutro Abs: 7 10*3/uL (ref 1.7–7.7)
Neutrophils Relative %: 61 %
Platelets: 232 10*3/uL (ref 150–400)
RBC: 4.56 MIL/uL (ref 3.87–5.11)
RDW: 16.2 % — ABNORMAL HIGH (ref 11.5–15.5)
WBC: 11.1 10*3/uL — ABNORMAL HIGH (ref 4.0–10.5)
nRBC: 0.5 % — ABNORMAL HIGH (ref 0.0–0.2)

## 2020-08-22 LAB — COMPREHENSIVE METABOLIC PANEL
ALT: 17 U/L (ref 0–44)
AST: 11 U/L — ABNORMAL LOW (ref 15–41)
Albumin: 3.9 g/dL (ref 3.5–5.0)
Alkaline Phosphatase: 77 U/L (ref 38–126)
Anion gap: 10 (ref 5–15)
BUN: 6 mg/dL (ref 6–20)
CO2: 29 mmol/L (ref 22–32)
Calcium: 9.7 mg/dL (ref 8.9–10.3)
Chloride: 106 mmol/L (ref 98–111)
Creatinine, Ser: 0.51 mg/dL (ref 0.44–1.00)
GFR, Estimated: >60 mL/min (ref >60)
Glucose, Bld: 98 mg/dL (ref 70–99)
Potassium: 4.4 mmol/L (ref 3.5–5.1)
Sodium: 145 mmol/L (ref 135–145)
Total Bilirubin: 0.9 mg/dL (ref 0.3–1.2)
Total Protein: 7 g/dL (ref 6.5–8.1)

## 2020-08-22 LAB — RETICULOCYTES
Immature Retic Fract: 38.6 % — ABNORMAL HIGH (ref 2.3–15.9)
RBC.: 4.49 MIL/uL (ref 3.87–5.11)
Retic Count, Absolute: 224.1 10*3/uL — ABNORMAL HIGH (ref 19.0–186.0)
Retic Ct Pct: 5 % — ABNORMAL HIGH (ref 0.4–3.1)

## 2020-08-22 MED ORDER — SODIUM CHLORIDE 0.45 % IV SOLN: INTRAVENOUS | Status: DC

## 2020-08-22 MED ORDER — DIPHENHYDRAMINE HCL 25 MG PO CAPS
25.0000 mg | ORAL_CAPSULE | ORAL | Status: DC | PRN
  Filled 2020-08-22: qty 1

## 2020-08-22 MED ORDER — SODIUM CHLORIDE 0.9% FLUSH: 9.0000 mL | INTRAVENOUS | Status: DC | PRN

## 2020-08-22 MED ORDER — ONDANSETRON HCL 4 MG/2ML IJ SOLN: 4.0000 mg | Freq: Four times a day (QID) | INTRAMUSCULAR | Status: DC | PRN

## 2020-08-22 MED ORDER — NALOXONE HCL 0.4 MG/ML IJ SOLN: 0.4000 mg | INTRAMUSCULAR | Status: DC | PRN

## 2020-08-22 MED ORDER — KETOROLAC TROMETHAMINE 30 MG/ML IJ SOLN
15.0000 mg | Freq: Once | INTRAMUSCULAR | Status: AC
  Administered 2020-08-22: 15 mg via INTRAVENOUS
  Filled 2020-08-22: qty 1

## 2020-08-22 MED ORDER — ACETAMINOPHEN 500 MG PO TABS
1000.0000 mg | ORAL_TABLET | Freq: Once | ORAL | Status: AC
  Administered 2020-08-22: 1000 mg via ORAL
  Filled 2020-08-22: qty 2

## 2020-08-22 MED ORDER — HYDROMORPHONE 1 MG/ML IV SOLN
INTRAVENOUS | Status: DC
  Administered 2020-08-22: 16.2 mg via INTRAVENOUS
  Filled 2020-08-22 (×2): qty 30

## 2020-08-22 NOTE — Progress Notes (Signed)
Patient admitted to the day infusion hospital for sickle cell pain. Initially, patient reported bilateral leg and left arm pain rated 8/10. For pain management, patient placed on Dilaudid PCA, given Tylenol, Toradol and hydrated with IV fluids. At discharge, patient rated pain at 5/10. Vital signs stable. AVS offered but patient refused. Patient alert, oriented and ambulatory at discharge.

## 2020-08-22 NOTE — Discharge Summary (Signed)
[facility] Discharge Summary   Patient ID: Stephanie Burch MRN: [id number] DOB/AGE: 1980/03/21 40 y.o.  Admit date: 08/22/2020 Discharge date: 08/22/2020  Primary Care Physician:  Massie Maroon, FNP  Admission Diagnoses:  Active Problems:   Sickle cell pain crisis [facility])   Discharge Medications:  Allergies as of 08/22/2020   No Known Allergies      Medication List     TAKE these medications    acetaminophen 500 MG tablet Commonly known as: TYLENOL Take 2 tablets (1,000 mg total) by mouth every 6 (six) hours as needed.   crizanlizumab-tmca 100 MG/10ML Soln Commonly known as: ADAKVEO Inject 680 mg into the vein every 30 (thirty) days.   folic acid 1 MG tablet Commonly known as: FOLVITE Take 1 tablet (1 mg total) by mouth daily.   gabapentin 300 MG capsule Commonly known as: NEURONTIN Take 1 capsule (300 mg total) by mouth 3 (three) times daily.   ibuprofen 800 MG tablet Commonly known as: ADVIL Take 1 tablet (800 mg total) by mouth every 8 (eight) hours as needed for headache, mild pain, moderate pain or cramping.   ondansetron 4 MG tablet Commonly known as: ZOFRAN Take 1 tablet (4 mg total) by mouth every 8 (eight) hours as needed.   Oxycodone HCl 20 MG Tabs Take 1 tablet (20 mg total) by mouth every 4 (four) hours as needed (pain).   Vitamin D (Ergocalciferol) 1.25 MG (50000 UNIT) Caps capsule Commonly known as: DRISDOL Take 1 capsule (50,000 Units total) by mouth every 7 (seven) days.   Xtampza ER 18 MG C12a Generic drug: oxyCODONE ER Take 18 mg by mouth every 12 (twelve) hours.       ASK your doctor about these medications    valACYclovir 500 MG tablet Commonly known as: Valtrex Take Valtrex 500 mg daily for suppression of outbreaks. Take Valtrex 1000 mg daily X 1 week for outbreaks.         Consults:  None  Significant Diagnostic Studies:  No results found.  History of present illness:  Stephanie Burch is a 40 year old  female with a medical history significant for sickle cell disease, chronic pain syndrome, opiate dependence and tolerance, anemia of chronic disease, and morbid obesity presents with complaints of upper and lower extremity pain that is consistent with her typical pain crisis.  Patient was evaluated in primary care on yesterday and reported increased pain to upper and lower extremities over the past 3 days.  She has not identified any provoking factors.  She characterizes pain as constant and throbbing.  Pain intensity is 9/10.  Patient last had Xtampza ER and oxycodone this a.m. without sustained relief.  She denies any fever, chills, chest pain, or shortness of breath.  No urinary symptoms, nausea, vomiting, or diarrhea.  No sick contacts, recent travel, or exposure to COVID-19.  [facility] Course: Patient admitted to sickle cell day infusion clinic for management of pain crisis Reviewed all laboratory values, consistent with patient's baseline Pain managed with IV Dilaudid PCA Toradol 15 mg IV x1 Tylenol 1000 mg by mouth x1 IV fluids, 0.45% saline at 100 mL/h Pain intensity decreased to 6/10.  Patient is requesting discharge home.  She is alert, oriented, and ambulating without assistance. Patient will discharge home in hemodynamically stable condition.  Physical Exam at Discharge:  BP 138/76 (BP Location: Right Arm)   Pulse 81   Temp (!) 97.5 F (36.4 C) (Temporal)   Resp 16   LMP 08/15/2020  SpO2 97%   Physical Exam Constitutional:      Appearance: Normal appearance.  Eyes:     Pupils: Pupils are equal, round, and reactive to light.  Cardiovascular:     Rate and Rhythm: Normal rate and regular rhythm.     Pulses: Normal pulses.  Pulmonary:     Effort: Pulmonary effort is normal.  Abdominal:     General: Abdomen is flat. Bowel sounds are normal.  Musculoskeletal:        General: Normal range of motion.  Skin:    General: Skin is warm.  Neurological:      General: No focal deficit present.     Mental Status: She is alert. Mental status is at baseline.  Psychiatric:        Mood and Affect: Mood normal.        Behavior: Behavior normal.        Thought Content: Thought content normal.        Judgment: Judgment normal.    Disposition at Discharge: Discharge disposition: 01-Home or Self Care       Discharge Orders: Discharge Instructions     Discharge patient   Complete by: As directed    Discharge disposition: 01-Home or Self Care   Discharge patient date: 08/22/2020       Condition at Discharge:   Stable  Time spent on Discharge:  Greater than 30 minutes.  Signed: Nolon Nations  APRN, MSN, FNP-C Patient Care [facility] [address] [place], [place] [address] [phone]  08/22/2020, 4:17 PM

## 2020-08-22 NOTE — Telephone Encounter (Signed)
Patient called in. Complains of pain in legs and L arm rates 8/10. Denied chest pain, abd pain, fever, N/V/D. Wants to come in for treatment. Last took Xtampza  and Oxycodone 20mg  at 4:00am . Pt states she was discharged from the hospital last week. Pt states the transportation service will bring her to center today. Denies exposure to anyone covid positive in last two weeks, denies flu like symptoms today. FNP made aware, approved for pt to be seen in day hospital. Pt notified, verbalized understanding.

## 2020-08-22 NOTE — H&P (Signed)
[facility] History and Physical   Date: 08/22/2020  Patient name: Stephanie Burch Medical record number: [id number] Date of birth: 03-14-1980 Age: 40 y.o. Gender: female PCP: Massie Maroon, FNP  Attending physician: Quentin Angst, MD  Chief Complaint: Sickle cell pain   History of Present Illness: Stephanie Burch is a 40 year old female with a medical history significant for sickle cell disease, chronic pain syndrome, opiate dependence and tolerance, anemia of chronic disease, and morbid obesity presents with complaints of upper and lower extremity pain that is consistent with her typical pain crisis.  Patient was evaluated in primary care on yesterday and reported increased pain to upper and lower extremities over the past 3 days.  She has not identified any provoking factors.  She characterizes pain as constant and throbbing.  Pain intensity is 9/10.  Patient last had Xtampza ER and oxycodone this a.m. without sustained relief.  She denies any fever, chills, chest pain, or shortness of breath.  No urinary symptoms, nausea, vomiting, or diarrhea.  No sick contacts, recent travel, or exposure to COVID-19.  Meds: Medications Prior to Admission  Medication Sig Dispense Refill Last Dose   acetaminophen (TYLENOL) 500 MG tablet Take 2 tablets (1,000 mg total) by mouth every 6 (six) hours as needed. 30 tablet 2    crizanlizumab-tmca (ADAKVEO) 100 MG/10ML SOLN Inject 680 mg into the vein every 30 (thirty) days. 68 mL 0    folic acid (FOLVITE) 1 MG tablet Take 1 tablet (1 mg total) by mouth daily. 30 tablet 11    gabapentin (NEURONTIN) 300 MG capsule Take 1 capsule (300 mg total) by mouth 3 (three) times daily. 90 capsule 0    ibuprofen (ADVIL) 800 MG tablet Take 1 tablet (800 mg total) by mouth every 8 (eight) hours as needed for headache, mild pain, moderate pain or cramping. 30 tablet 1    ondansetron (ZOFRAN) 4 MG tablet Take 1 tablet (4 mg total) by mouth every 8 (eight)  hours as needed. 20 tablet 0    oxyCODONE ER (XTAMPZA ER) 18 MG C12A Take 18 mg by mouth every 12 (twelve) hours. 60 capsule 0    Oxycodone HCl 20 MG TABS Take 1 tablet (20 mg total) by mouth every 4 (four) hours as needed (pain). 90 tablet 0    valACYclovir (VALTREX) 500 MG tablet Take Valtrex 500 mg daily for suppression of outbreaks. Take Valtrex 1000 mg daily X 1 week for outbreaks. (Patient taking differently: Take 500 mg by mouth daily.) 90 tablet 11    Vitamin D, Ergocalciferol, (DRISDOL) 1.25 MG (50000 UT) CAPS capsule Take 1 capsule (50,000 Units total) by mouth every 7 (seven) days. 5 capsule 6     Allergies: Patient has no known allergies. Past Medical History:  Diagnosis Date   HSV infection    Nausea    Sickle cell anemia (HCC)    Vitamin B12 deficiency 12/2018   Vitamin D deficiency    No past surgical history on file. Family History  Problem Relation Age of Onset   Hypertension Mother    Sickle cell trait Mother    Diabetes Father    Sickle cell trait Father    Social History   Socioeconomic History   Marital status: Single    Spouse name: Not on file   Number of children: Not on file   Years of education: Not on file   Highest education level: Not on file  Occupational History   Not on file  Tobacco Use   Smoking status: Never   Smokeless tobacco: Never  Vaping Use   Vaping Use: Never used  Substance and Sexual Activity   Alcohol use: Not Currently   Drug use: Never   Sexual activity: Yes    Birth control/protection: None  Other Topics Concern   Not on file  Social History Narrative   Not on file   Social Determinants of Health   Financial Resource Strain: Not on file  Food Insecurity: Not on file  Transportation Needs: Not on file  Physical Activity: Not on file  Stress: Not on file  Social Connections: Not on file  Intimate Partner Violence: Not on file   Review of Systems  Constitutional:  Negative for chills and fever.  HENT: Negative.     Eyes: Negative.   Respiratory: Negative.    Cardiovascular: Negative.   Gastrointestinal: Negative.   Genitourinary: Negative.   Musculoskeletal:  Positive for back pain and joint pain.  Neurological: Negative.   Psychiatric/Behavioral: Negative.     Physical Exam: Last menstrual period 08/15/2020. Physical Exam Constitutional:      Appearance: Normal appearance.  Eyes:     Pupils: Pupils are equal, round, and reactive to light.  Cardiovascular:     Rate and Rhythm: Normal rate and regular rhythm.     Pulses: Normal pulses.  Pulmonary:     Effort: Pulmonary effort is normal.  Abdominal:     General: Abdomen is flat. Bowel sounds are normal.  Musculoskeletal:        General: Normal range of motion.  Skin:    General: Skin is warm.  Neurological:     General: No focal deficit present.     Mental Status: She is alert. Mental status is at baseline.  Psychiatric:        Mood and Affect: Mood normal.        Behavior: Behavior normal.        Thought Content: Thought content normal.        Judgment: Judgment normal.     Lab results: Results for orders placed or performed in visit on 08/21/20 (from the past 24 hour(s))  HgB A1c     Status: Normal   Collection Time: 08/21/20  2:41 PM  Result Value Ref Range   Hemoglobin A1C 4.5 4.0 - 5.6 %   HbA1c POC (<> result, manual entry)     HbA1c, POC (prediabetic range)     HbA1c, POC (controlled diabetic range)      Imaging results:  No results found.   Assessment & Plan: Patient admitted to sickle cell day infusion center for management of pain crisis.  Patient is opiate tolerant Initiate IV dilaudid PCA. Settings of 0.6 mg 10 minute lockout, and 4 mg/hr IV fluids, 0.45% saline at 100 ml/hr Toradol 15 mg IV times one dose Tylenol 1000 mg by mouth times one dose Review CBC with differential, complete metabolic panel, and reticulocytes as results become available. Pain intensity will be reevaluated in context of  functioning and relationship to baseline as care progresses If pain intensity remains elevated and/or sudden change in hemodynamic stability transition to inpatient services for higher level of care.      Nolon Nations  APRN, MSN, FNP-C Patient Care [facility] [address] [place], [place] [address] [phone]  08/22/2020, 9:39 AM

## 2020-08-27 ENCOUNTER — Encounter (HOSPITAL_COMMUNITY): Payer: Medicare Other

## 2020-08-28 ENCOUNTER — Other Ambulatory Visit: Payer: Self-pay | Admitting: Family Medicine

## 2020-08-28 ENCOUNTER — Other Ambulatory Visit: Payer: Self-pay

## 2020-08-28 DIAGNOSIS — F1190 Opioid use, unspecified, uncomplicated: Secondary | ICD-10-CM

## 2020-08-28 DIAGNOSIS — G894 Chronic pain syndrome: Secondary | ICD-10-CM

## 2020-08-28 DIAGNOSIS — D571 Sickle-cell disease without crisis: Secondary | ICD-10-CM

## 2020-08-28 LAB — DRUG SCREEN 764883 11+OXYCO+ALC+CRT-BUND
Amphetamines, Urine: NEGATIVE ng/mL
BENZODIAZ UR QL: NEGATIVE ng/mL
Barbiturate: NEGATIVE ng/mL
Cannabinoid Quant, Ur: NEGATIVE ng/mL
Cocaine (Metabolite): NEGATIVE ng/mL
Creatinine: 65.7 mg/dL (ref 20.0–300.0)
Ethanol: NEGATIVE %
Meperidine: NEGATIVE ng/mL
Methadone Screen, Urine: NEGATIVE ng/mL
Phencyclidine: NEGATIVE ng/mL
Propoxyphene: NEGATIVE ng/mL
Tramadol: NEGATIVE ng/mL
pH, Urine: 6.8 (ref 4.5–8.9)

## 2020-08-28 LAB — OXYCODONE/OXYMORPHONE, CONFIRM
OXYCODONE/OXYMORPH: POSITIVE — AB
OXYCODONE: 576 ng/mL
OXYCODONE: POSITIVE — AB
OXYMORPHONE (GC/MS): 2781 ng/mL
OXYMORPHONE: POSITIVE — AB

## 2020-08-28 LAB — OPIATES CONFIRMATION, URINE: Opiates: NEGATIVE ng/mL

## 2020-08-28 MED ORDER — OXYCODONE HCL 20 MG PO TABS: 20.0000 mg | ORAL_TABLET | ORAL | 0 refills | Status: DC | PRN

## 2020-08-28 NOTE — Progress Notes (Signed)
Reviewed PDMP substance reporting system prior to prescribing opiate medications. No inconsistencies noted.  Meds ordered this encounter  Medications   Oxycodone HCl 20 MG TABS    Sig: Take 1 tablet (20 mg total) by mouth every 4 (four) hours as needed (pain).    Dispense:  90 tablet    Refill:  0    Order Specific Question:   Supervising Provider    Answer:   JEGEDE, OLUGBEMIGA E [[id number]]     Stephanie Burch Stephanie Soffer  APRN, MSN, FNP-C [facility] [address]  [place], [place] [address] [phone]  

## 2020-08-30 ENCOUNTER — Other Ambulatory Visit: Payer: Self-pay | Admitting: Family Medicine

## 2020-09-05 ENCOUNTER — Telehealth (HOSPITAL_COMMUNITY): Payer: Self-pay

## 2020-09-05 ENCOUNTER — Other Ambulatory Visit: Payer: Self-pay

## 2020-09-05 ENCOUNTER — Encounter (HOSPITAL_COMMUNITY): Payer: Self-pay

## 2020-09-05 ENCOUNTER — Inpatient Hospital Stay (HOSPITAL_COMMUNITY)
Admission: EM | Admit: 2020-09-05 | Discharge: 2020-09-10 | DRG: 812 | Disposition: A | Discharge status: Home / Self Care | Payer: Medicare Other | Attending: Internal Medicine | Admitting: Internal Medicine

## 2020-09-05 DIAGNOSIS — Z8249 Family history of ischemic heart disease and other diseases of the circulatory system: Secondary | ICD-10-CM

## 2020-09-05 DIAGNOSIS — Z6841 Body Mass Index (BMI) 40.0 and over, adult: Secondary | ICD-10-CM

## 2020-09-05 DIAGNOSIS — Z8616 Personal history of COVID-19: Secondary | ICD-10-CM

## 2020-09-05 DIAGNOSIS — Z79899 Other long term (current) drug therapy: Secondary | ICD-10-CM

## 2020-09-05 DIAGNOSIS — G894 Chronic pain syndrome: Secondary | ICD-10-CM | POA: Diagnosis present

## 2020-09-05 DIAGNOSIS — Z20822 Contact with and (suspected) exposure to covid-19: Secondary | ICD-10-CM | POA: Diagnosis present

## 2020-09-05 DIAGNOSIS — R001 Bradycardia, unspecified: Secondary | ICD-10-CM | POA: Diagnosis present

## 2020-09-05 DIAGNOSIS — Z832 Family history of diseases of the blood and blood-forming organs and certain disorders involving the immune mechanism: Secondary | ICD-10-CM

## 2020-09-05 DIAGNOSIS — F1120 Opioid dependence, uncomplicated: Secondary | ICD-10-CM | POA: Diagnosis present

## 2020-09-05 DIAGNOSIS — D72829 Elevated white blood cell count, unspecified: Secondary | ICD-10-CM | POA: Diagnosis not present

## 2020-09-05 DIAGNOSIS — D5700 Hb-SS disease with crisis, unspecified: Secondary | ICD-10-CM | POA: Diagnosis not present

## 2020-09-05 DIAGNOSIS — Z833 Family history of diabetes mellitus: Secondary | ICD-10-CM

## 2020-09-05 LAB — COMPREHENSIVE METABOLIC PANEL
ALT: 27 U/L (ref 0–44)
AST: 17 U/L (ref 15–41)
Albumin: 3.9 g/dL (ref 3.5–5.0)
Alkaline Phosphatase: 84 U/L (ref 38–126)
Anion gap: 10 (ref 5–15)
BUN: 7 mg/dL (ref 6–20)
CO2: 27 mmol/L (ref 22–32)
Calcium: 9.5 mg/dL (ref 8.9–10.3)
Chloride: 105 mmol/L (ref 98–111)
Creatinine, Ser: 0.47 mg/dL (ref 0.44–1.00)
GFR, Estimated: >60 mL/min (ref >60)
Glucose, Bld: 102 mg/dL — ABNORMAL HIGH (ref 70–99)
Potassium: 4.2 mmol/L (ref 3.5–5.1)
Sodium: 142 mmol/L (ref 135–145)
Total Bilirubin: 0.7 mg/dL (ref 0.3–1.2)
Total Protein: 7.5 g/dL (ref 6.5–8.1)

## 2020-09-05 LAB — RETICULOCYTES
Immature Retic Fract: 36.5 % — ABNORMAL HIGH (ref 2.3–15.9)
RBC.: 5.28 MIL/uL — ABNORMAL HIGH (ref 3.87–5.11)
Retic Count, Absolute: 195.4 10*3/uL — ABNORMAL HIGH (ref 19.0–186.0)
Retic Ct Pct: 3.7 % — ABNORMAL HIGH (ref 0.4–3.1)

## 2020-09-05 LAB — CBC WITH DIFFERENTIAL/PLATELET
Abs Immature Granulocytes: 0.05 10*3/uL (ref 0.00–0.07)
Basophils Absolute: 0.1 10*3/uL (ref 0.0–0.1)
Basophils Relative: 0 %
Eosinophils Absolute: 0.3 10*3/uL (ref 0.0–0.5)
Eosinophils Relative: 3 %
HCT: 35.9 % — ABNORMAL LOW (ref 36.0–46.0)
Hemoglobin: 12.3 g/dL (ref 12.0–15.0)
Immature Granulocytes: 0 %
Lymphocytes Relative: 15 %
Lymphs Abs: 1.7 10*3/uL (ref 0.7–4.0)
MCH: 23.2 pg — ABNORMAL LOW (ref 26.0–34.0)
MCHC: 34.3 g/dL (ref 30.0–36.0)
MCV: 67.7 fL — ABNORMAL LOW (ref 80.0–100.0)
Monocytes Absolute: 0.7 10*3/uL (ref 0.1–1.0)
Monocytes Relative: 5 %
Neutro Abs: 9.1 10*3/uL — ABNORMAL HIGH (ref 1.7–7.7)
Neutrophils Relative %: 77 %
Platelets: 323 10*3/uL (ref 150–400)
RBC: 5.3 MIL/uL — ABNORMAL HIGH (ref 3.87–5.11)
RDW: 15.9 % — ABNORMAL HIGH (ref 11.5–15.5)
WBC: 11.9 10*3/uL — ABNORMAL HIGH (ref 4.0–10.5)
nRBC: 0.2 % (ref 0.0–0.2)

## 2020-09-05 LAB — I-STAT BETA HCG BLOOD, ED (MC, WL, AP ONLY): I-stat hCG, quantitative: <5 m[IU]/mL (ref <5)

## 2020-09-05 MED ORDER — HYDROMORPHONE HCL 2 MG/ML IJ SOLN
2.0000 mg | INTRAMUSCULAR | Status: AC
  Administered 2020-09-05: 2 mg via INTRAVENOUS
  Filled 2020-09-05: qty 1

## 2020-09-05 MED ORDER — ENOXAPARIN SODIUM 40 MG/0.4ML IJ SOSY
40.0000 mg | PREFILLED_SYRINGE | INTRAMUSCULAR | Status: DC
  Administered 2020-09-06 – 2020-09-09 (×4): 40 mg via SUBCUTANEOUS
  Filled 2020-09-05 (×5): qty 0.4

## 2020-09-05 MED ORDER — HYDROXYZINE HCL 25 MG PO TABS
25.0000 mg | ORAL_TABLET | ORAL | Status: DC | PRN
  Administered 2020-09-06: 50 mg via ORAL
  Administered 2020-09-06: 25 mg via ORAL
  Administered 2020-09-08 – 2020-09-09 (×4): 50 mg via ORAL
  Filled 2020-09-05: qty 1
  Filled 2020-09-05 (×6): qty 2

## 2020-09-05 MED ORDER — NALOXONE HCL 0.4 MG/ML IJ SOLN: 0.4000 mg | INTRAMUSCULAR | Status: DC | PRN

## 2020-09-05 MED ORDER — PNEUMOCOCCAL VAC POLYVALENT 25 MCG/0.5ML IJ INJ
0.5000 mL | INJECTION | INTRAMUSCULAR | Status: DC
Start: 2020-09-06 — End: 2020-09-10

## 2020-09-05 MED ORDER — KETOROLAC TROMETHAMINE 15 MG/ML IJ SOLN
15.0000 mg | Freq: Four times a day (QID) | INTRAMUSCULAR | Status: DC
  Administered 2020-09-05 – 2020-09-10 (×19): 15 mg via INTRAVENOUS
  Filled 2020-09-05 (×19): qty 1

## 2020-09-05 MED ORDER — POLYETHYLENE GLYCOL 3350 17 G PO PACK: 17.0000 g | PACK | Freq: Every day | ORAL | Status: DC | PRN

## 2020-09-05 MED ORDER — HYDROMORPHONE HCL 2 MG/ML IJ SOLN
2.0000 mg | INTRAMUSCULAR | Status: DC
  Administered 2020-09-05: 2 mg via INTRAVENOUS
  Filled 2020-09-05: qty 1

## 2020-09-05 MED ORDER — HYDROMORPHONE HCL 1 MG/ML IJ SOLN: 0.5000 mg | INTRAMUSCULAR | Status: DC

## 2020-09-05 MED ORDER — OXYCODONE HCL 5 MG PO TABS
15.0000 mg | ORAL_TABLET | Freq: Once | ORAL | Status: AC
  Administered 2020-09-05: 15 mg via ORAL
  Filled 2020-09-05: qty 3

## 2020-09-05 MED ORDER — OXYCODONE HCL ER 10 MG PO T12A
20.0000 mg | EXTENDED_RELEASE_TABLET | Freq: Two times a day (BID) | ORAL | Status: DC
  Administered 2020-09-05 – 2020-09-10 (×10): 20 mg via ORAL
  Filled 2020-09-05 (×10): qty 2

## 2020-09-05 MED ORDER — HYDROMORPHONE 1 MG/ML IV SOLN
INTRAVENOUS | Status: DC
Start: 2020-09-05 — End: 2020-09-06
  Administered 2020-09-06: 6.5 mg via INTRAVENOUS
  Filled 2020-09-05 (×3): qty 30

## 2020-09-05 MED ORDER — SODIUM CHLORIDE 0.9% FLUSH
9.0000 mL | INTRAVENOUS | Status: DC | PRN
Start: 2020-09-05 — End: 2020-09-10

## 2020-09-05 MED ORDER — ONDANSETRON HCL 4 MG PO TABS: 4.0000 mg | ORAL_TABLET | ORAL | Status: DC | PRN

## 2020-09-05 MED ORDER — SENNOSIDES-DOCUSATE SODIUM 8.6-50 MG PO TABS
1.0000 | ORAL_TABLET | Freq: Two times a day (BID) | ORAL | Status: DC
  Administered 2020-09-06 – 2020-09-10 (×4): 1 via ORAL
  Filled 2020-09-05 (×8): qty 1

## 2020-09-05 MED ORDER — SODIUM CHLORIDE 0.45 % IV SOLN: INTRAVENOUS | Status: DC

## 2020-09-05 MED ORDER — ACETAMINOPHEN 500 MG PO TABS
1000.0000 mg | ORAL_TABLET | Freq: Four times a day (QID) | ORAL | Status: DC | PRN
  Administered 2020-09-08: 1000 mg via ORAL
  Filled 2020-09-05: qty 2

## 2020-09-05 MED ORDER — FOLIC ACID 1 MG PO TABS
1.0000 mg | ORAL_TABLET | Freq: Every day | ORAL | Status: DC
  Administered 2020-09-06 – 2020-09-10 (×5): 1 mg via ORAL
  Filled 2020-09-05 (×5): qty 1

## 2020-09-05 MED ORDER — HYDROMORPHONE HCL 1 MG/ML IJ SOLN: 1.0000 mg | INTRAMUSCULAR | Status: DC

## 2020-09-05 MED ORDER — KETOROLAC TROMETHAMINE 15 MG/ML IJ SOLN
15.0000 mg | INTRAMUSCULAR | Status: AC
  Administered 2020-09-05: 15 mg via INTRAVENOUS
  Filled 2020-09-05: qty 1

## 2020-09-05 MED ORDER — HYDROMORPHONE HCL 2 MG/ML IJ SOLN
2.0000 mg | INTRAMUSCULAR | Status: DC | PRN
  Administered 2020-09-05 – 2020-09-06 (×4): 2 mg via INTRAVENOUS
  Filled 2020-09-05 (×4): qty 1

## 2020-09-05 MED ORDER — ONDANSETRON HCL 4 MG/2ML IJ SOLN
4.0000 mg | INTRAMUSCULAR | Status: DC | PRN
  Administered 2020-09-06: 4 mg via INTRAVENOUS
  Filled 2020-09-05: qty 2

## 2020-09-05 NOTE — Telephone Encounter (Signed)
Pt called in wanting to be seen in day hospital, pt notified she will have to go to the ED for care today due to the day hospital being closed per [place] FNP. Pt verbalized understanding.

## 2020-09-05 NOTE — ED Provider Notes (Signed)
[facility]-EMERGENCY DEPT Provider Note   CSN: [id number] Arrival date & time: 09/05/20  1012     History Chief Complaint  Patient presents with   Sickle Cell Pain Crisis    Stephanie Burch is a 40 y.o. female with PMHx sickle cell anemia who presents to the ED today with complaint of gradual onset, constant, achy, bilateral leg pain and LUE pain for the past 2 days.  Patient reports this feels similar to previous sickle cell pain.  She states that she last took her Xtampza ER and her oxycodone today.  She states that she has been here since this morning and did not want to take anything prior to coming however did not know she would be in the waiting room for so long.  She denies any fevers, chills, chest pain, shortness of breath, nausea, vomiting, any other associated symptoms.   The history is provided by the patient and medical records.      Past Medical History:  Diagnosis Date   HSV infection    Nausea    Sickle cell anemia (HCC)    Vitamin B12 deficiency 12/2018   Vitamin D deficiency     Patient Active Problem List   Diagnosis Date Noted   Sickle cell anemia with crisis (HCC) 06/12/2020   Pulmonary edema 11/11/2019   Hypoxia    Poor venous access 11/09/2019   HCAP (healthcare-associated pneumonia) 11/07/2019   Sepsis (HCC) 11/07/2019   Acute kidney injury (HCC) 11/07/2019   History of COVID-19 11/07/2019   COVID-19 virus detected 10/22/2019   Sore throat due to virus 10/22/2019   Urinary frequency 04/20/2019   Vaginal odor 04/20/2019   Chest pain varying with breathing    Fever and chills    Vitamin D deficiency 11/15/2018   Nausea 11/15/2018   Abnormal pulse oximetry    Hypokalemia 09/20/2018   Medication management 09/14/2018   Muscle spasms of both lower extremities 08/25/2018   Subjective visual disturbance of right eye 08/25/2018   Hb-SS disease without crisis (HCC) 04/13/2018   Chronic pain syndrome 03/16/2018   Chronic,  continuous use of opioids 03/16/2018   Tachycardia with heart rate 100-120 beats per minute    Sickle cell crisis (HCC) 10/19/2017   Leukocytosis 10/19/2017   Thrombocytopenia (HCC) 10/19/2017   Morbid obesity with BMI of 45.0-49.9, adult (HCC) 10/19/2017   Sickle cell pain crisis (HCC) 10/15/2017    History reviewed. No pertinent surgical history.   OB History     Gravida  3   Para      Term      Preterm      AB      Living  3      SAB      IAB      Ectopic      Multiple      Live Births              Family History  Problem Relation Age of Onset   Hypertension Mother    Sickle cell trait Mother    Diabetes Father    Sickle cell trait Father     Social History   Tobacco Use   Smoking status: Never   Smokeless tobacco: Never  Vaping Use   Vaping Use: Never used  Substance Use Topics   Alcohol use: Not Currently   Drug use: Never    Home Medications Prior to Admission medications   Medication Sig Start Date End Date Taking? Authorizing Provider  crizanlizumab-tmca (ADAKVEO) 100 MG/10ML SOLN Inject 680 mg into the vein every 30 (thirty) days. 07/20/20  Yes Massie Maroon, FNP  folic acid (FOLVITE) 1 MG tablet Take 1 tablet (1 mg total) by mouth daily. 11/15/18  Yes Kallie Locks, FNP  ibuprofen (ADVIL) 800 MG tablet Take 1 tablet (800 mg total) by mouth every 8 (eight) hours as needed for headache, mild pain, moderate pain or cramping. 08/09/20  Yes Massie Maroon, FNP  ondansetron (ZOFRAN) 4 MG tablet TAKE 1 TABLET BY MOUTH EVERY 8 HOURS AS NEEDED Patient taking differently: Take 4 mg by mouth every 8 (eight) hours as needed for nausea or vomiting. 08/30/20  Yes Massie Maroon, FNP  oxyCODONE ER (XTAMPZA ER) 18 MG C12A Take 18 mg by mouth every 12 (twelve) hours. 08/21/20  Yes Massie Maroon, FNP  Oxycodone HCl 20 MG TABS Take 1 tablet (20 mg total) by mouth every 4 (four) hours as needed (pain). 08/30/20  Yes Massie Maroon, FNP   valACYclovir (VALTREX) 500 MG tablet Take Valtrex 500 mg daily for suppression of outbreaks. Take Valtrex 1000 mg daily X 1 week for outbreaks. Patient taking differently: Take 500 mg by mouth daily. 10/20/19  Yes Kallie Locks, FNP  acetaminophen (TYLENOL) 500 MG tablet Take 2 tablets (1,000 mg total) by mouth every 6 (six) hours as needed. Patient not taking: No sig reported 08/21/20   Massie Maroon, FNP  gabapentin (NEURONTIN) 300 MG capsule Take 1 capsule (300 mg total) by mouth 3 (three) times daily. Patient not taking: No sig reported 08/21/20   Massie Maroon, FNP  gabapentin (NEURONTIN) 300 MG capsule Take 300 mg by mouth 3 (three) times daily.    [provider]  Vitamin D, Ergocalciferol, (DRISDOL) 1.25 MG (50000 UT) CAPS capsule Take 1 capsule (50,000 Units total) by mouth every 7 (seven) days. Patient not taking: Reported on 09/05/2020 12/21/18   Kallie Locks, FNP    Allergies    Patient has no known allergies.  Review of Systems   Review of Systems  Constitutional:  Negative for chills and fever.  Respiratory:  Negative for shortness of breath.   Cardiovascular:  Negative for chest pain.  Gastrointestinal:  Negative for nausea and vomiting.  Musculoskeletal:  Positive for arthralgias.  Skin:  Negative for color change.  Neurological:  Negative for weakness and numbness.  All other systems reviewed and are negative.  Physical Exam Updated Vital Signs BP (!) 138/94   Pulse 94   Temp 98.1 F (36.7 C) (Oral)   Resp 18   LMP 08/15/2020   SpO2 100%   Physical Exam Vitals and nursing note reviewed.  Constitutional:      Appearance: She is not ill-appearing or diaphoretic.  HENT:     Head: Normocephalic and atraumatic.  Eyes:     Conjunctiva/sclera: Conjunctivae normal.  Cardiovascular:     Rate and Rhythm: Normal rate and regular rhythm.     Pulses: Normal pulses.  Pulmonary:     Effort: Pulmonary effort is normal.     Breath sounds: Normal  breath sounds. No wheezing, rhonchi or rales.  Abdominal:     Palpations: Abdomen is soft.     Tenderness: There is no abdominal tenderness.  Musculoskeletal:        General: Tenderness present.     Cervical back: Neck supple.  Skin:    General: Skin is warm and dry.  Neurological:     Mental Status: She  is alert.    ED Results / Procedures / Treatments   Labs (all labs ordered are listed, but only abnormal results are displayed) Labs Reviewed  COMPREHENSIVE METABOLIC PANEL - Abnormal; Notable for the following components:      Result Value   Glucose, Bld 102 (*)    All other components within normal limits  CBC WITH DIFFERENTIAL/PLATELET - Abnormal; Notable for the following components:   WBC 11.9 (*)    RBC 5.30 (*)    HCT 35.9 (*)    MCV 67.7 (*)    MCH 23.2 (*)    RDW 15.9 (*)    Neutro Abs 9.1 (*)    All other components within normal limits  RETICULOCYTES - Abnormal; Notable for the following components:   Retic Ct Pct 3.7 (*)    RBC. 5.28 (*)    Retic Count, Absolute 195.4 (*)    Immature Retic Fract 36.5 (*)    All other components within normal limits  SARS CORONAVIRUS 2 (TAT 6-24 HRS)  I-STAT BETA HCG BLOOD, ED ([facility], [facility], [facility] ONLY)    EKG None  Radiology No results found.  Procedures Procedures   Medications Ordered in ED Medications  HYDROmorphone (DILAUDID) injection 2 mg (2 mg Intravenous Given 09/05/20 1729)  pneumococcal 23 valent vaccine (PNEUMOVAX-23) injection 0.5 mL (has no administration in time range)  ketorolac (TORADOL) 15 MG/ML injection 15 mg (15 mg Intravenous Given 09/05/20 1622)  HYDROmorphone (DILAUDID) injection 2 mg (2 mg Intravenous Given 09/05/20 1622)  oxyCODONE (Oxy IR/ROXICODONE) immediate release tablet 15 mg (15 mg Oral Given 09/05/20 1936)    ED Course  I have reviewed the triage vital signs and the nursing notes.  Pertinent labs & imaging results that were available during my care of the patient were reviewed by me and  considered in my medical decision making (see chart for details).    MDM Rules/Calculators/A&P                           40 year old female who presents to the ED today with complaint of sickle cell pain crises in her bilateral lower extremities and left upper extremity, consistent with same.  On arrival to the ED patient was initially tachycardic however this is resolved.  She is afebrile, nontachypneic, nontoxic-appearing.  We will plan to provide pain medication, obtain labs at this time and reassess.  She denies any chest pain or shortness of breath to suggest acute chest syndrome at this time.   After 2 rounds of IV dilaudid and oral oxycodone pt reports her pain is still a 9/10. Will plan to admit at this time. I did have shared decision making with patient regarding admission vs outpatient sickle cell clinic follow up however pt states she does not feel like she can go home in this much pain.   Discussed case with [facility] Dr. Allena Katz who agrees to evaluate patient for admission.   This note was prepared using Dragon voice recognition software and may include unintentional dictation errors due to the inherent limitations of voice recognition software.   Final Clinical Impression(s) / ED Diagnoses Final diagnoses:  Sickle cell pain crisis [facility])    Rx / DC Orders ED Discharge Orders     None         Tanda Rockers, PA-C 09/05/20 2049    Derwood Kaplan, MD 09/06/20 1515

## 2020-09-05 NOTE — H&P (Signed)
History and Physical    Stephanie Burch KLK:[id number] DOB: 1980/12/29 DOA: 09/05/2020  PCP: Massie Maroon, FNP  Patient coming from: Home  I have personally briefly reviewed patient's old medical records in [facility]  Chief Complaint: Sickle cell pain  HPI: Stephanie Burch is a 40 y.o. female with medical history significant for sickle cell disease, chronic pain syndrome, opiate dependence and tolerance who presented to the ED for evaluation of lower extremity pain similar to her prior sickle cell pain crisis episodes.  Patient reports 2 days of lower extremity and left upper extremity pain similar to her prior sickle cell pain crisis episodes.  She has been taking her home Xtampza ER twice daily and oxycodone 20 mg as needed up to 4 times per day without significant relief.  She called in to be seen at the sickle cell clinic earlier today however was notified the office was closed and she subsequently came to the ED for further evaluation.  She otherwise denies any chest pain, cough, dyspnea, nausea, vomiting, abdominal pain, dysuria, lower extremity swelling.  ED Course:  Initial vitals show BP 157/75, pulse 98, RR 18, temp 98.1 F, SPO2 99% on room air.  Labs show WBC 11.9, hemoglobin 12.3, platelets 323,000, sodium 142, potassium 4.2, bicarb 27, BUN 7, creatinine 0.47, serum glucose 102, LFTs within normal limits, i-STAT beta-hCG <5.0.  SARS-CoV-2 PCR collected and pending.  Patient was given IV Dilaudid 2 mg x 2, IV Toradol 15 milligrams x1, and oral Oxy IR 15 mg once.  The hospitalist service was consulted to admit for further management of sickle cell pain crisis.  Review of Systems: All systems reviewed and are negative except as documented in history of present illness above.   Past Medical History:  Diagnosis Date   HSV infection    Nausea    Sickle cell anemia (HCC)    Vitamin B12 deficiency 12/2018   Vitamin D deficiency     History reviewed. No pertinent  surgical history.  Social History:  reports that she has never smoked. She has never used smokeless tobacco. She reports previous alcohol use. She reports that she does not use drugs.  No Known Allergies  Family History  Problem Relation Age of Onset   Hypertension Mother    Sickle cell trait Mother    Diabetes Father    Sickle cell trait Father      Prior to Admission medications   Medication Sig Start Date End Date Taking? Authorizing Provider  crizanlizumab-tmca (ADAKVEO) 100 MG/10ML SOLN Inject 680 mg into the vein every 30 (thirty) days. 07/20/20  Yes Massie Maroon, FNP  folic acid (FOLVITE) 1 MG tablet Take 1 tablet (1 mg total) by mouth daily. 11/15/18  Yes Kallie Locks, FNP  ibuprofen (ADVIL) 800 MG tablet Take 1 tablet (800 mg total) by mouth every 8 (eight) hours as needed for headache, mild pain, moderate pain or cramping. 08/09/20  Yes Massie Maroon, FNP  ondansetron (ZOFRAN) 4 MG tablet TAKE 1 TABLET BY MOUTH EVERY 8 HOURS AS NEEDED Patient taking differently: Take 4 mg by mouth every 8 (eight) hours as needed for nausea or vomiting. 08/30/20  Yes Massie Maroon, FNP  oxyCODONE ER (XTAMPZA ER) 18 MG C12A Take 18 mg by mouth every 12 (twelve) hours. 08/21/20  Yes Massie Maroon, FNP  Oxycodone HCl 20 MG TABS Take 1 tablet (20 mg total) by mouth every 4 (four) hours as needed (pain). 08/30/20  Yes Julianne Handler  M, FNP  valACYclovir (VALTREX) 500 MG tablet Take Valtrex 500 mg daily for suppression of outbreaks. Take Valtrex 1000 mg daily X 1 week for outbreaks. Patient taking differently: Take 500 mg by mouth daily. 10/20/19  Yes Kallie Locks, FNP  acetaminophen (TYLENOL) 500 MG tablet Take 2 tablets (1,000 mg total) by mouth every 6 (six) hours as needed. Patient not taking: No sig reported 08/21/20   Massie Maroon, FNP  gabapentin (NEURONTIN) 300 MG capsule Take 1 capsule (300 mg total) by mouth 3 (three) times daily. Patient not taking: No sig reported  08/21/20   Massie Maroon, FNP  gabapentin (NEURONTIN) 300 MG capsule Take 300 mg by mouth 3 (three) times daily.    [provider]  Vitamin D, Ergocalciferol, (DRISDOL) 1.25 MG (50000 UT) CAPS capsule Take 1 capsule (50,000 Units total) by mouth every 7 (seven) days. Patient not taking: Reported on 09/05/2020 12/21/18   Kallie Locks, FNP    Physical Exam: Vitals:   09/05/20 1930 09/05/20 2000 09/05/20 2030 09/05/20 2102  BP: 119/89 (!) 135/93 (!) 138/94 (!) 142/99  Pulse: 98 100 94 100  Resp: 18 18 18 18   Temp:      TempSrc:      SpO2: 98% 99% 100% 99%   Constitutional: Obese woman resting in bed with head elevated, tearful but otherwise CN NAD, calm, comfortable Eyes: PERRL, lids and conjunctivae normal ENMT: Mucous membranes are moist. Posterior pharynx clear of any exudate or lesions.Normal dentition.  Neck: normal, supple, no masses. Respiratory: clear to auscultation bilaterally, no wheezing, no crackles. Normal respiratory effort. No accessory muscle use.  Cardiovascular: Regular rate and rhythm, no murmurs / rubs / gallops. No extremity edema. 2+ pedal pulses. Abdomen: no tenderness, no masses palpated. No hepatosplenomegaly. Bowel sounds positive.  Musculoskeletal: no clubbing / cyanosis. No joint deformity upper and lower extremities. Good ROM, no contractures. Normal muscle tone.  Skin: no rashes, lesions, ulcers. No induration Neurologic: CN 2-12 grossly intact. Sensation intact. Strength 5/5 in all 4.  Psychiatric: Normal judgment and insight. Alert and oriented x 3. Normal mood.   Labs on Admission: I have personally reviewed following labs and imaging studies  CBC: Recent Labs  Lab 09/05/20 1113  WBC 11.9*  NEUTROABS 9.1*  HGB 12.3  HCT 35.9*  MCV 67.7*  PLT 323   Basic Metabolic Panel: Recent Labs  Lab 09/05/20 1113  NA 142  K 4.2  CL 105  CO2 27  GLUCOSE 102*  BUN 7  CREATININE 0.47  CALCIUM 9.5   GFR: CrCl cannot be calculated  (Unknown ideal weight.). Liver Function Tests: Recent Labs  Lab 09/05/20 1113  AST 17  ALT 27  ALKPHOS 84  BILITOT 0.7  PROT 7.5  ALBUMIN 3.9   No results for input(s): LIPASE, AMYLASE in the last 168 hours. No results for input(s): AMMONIA in the last 168 hours. Coagulation Profile: No results for input(s): INR, PROTIME in the last 168 hours. Cardiac Enzymes: No results for input(s): CKTOTAL, CKMB, CKMBINDEX, TROPONINI in the last 168 hours. BNP (last 3 results) No results for input(s): PROBNP in the last 8760 hours. HbA1C: No results for input(s): HGBA1C in the last 72 hours. CBG: No results for input(s): GLUCAP in the last 168 hours. Lipid Profile: No results for input(s): CHOL, HDL, LDLCALC, TRIG, CHOLHDL, LDLDIRECT in the last 72 hours. Thyroid Function Tests: No results for input(s): TSH, T4TOTAL, FREET4, T3FREE, THYROIDAB in the last 72 hours. Anemia Panel: Recent Labs  09/05/20 1113  RETICCTPCT 3.7*   Urine analysis:    Component Value Date/Time   COLORURINE YELLOW 06/12/2020 1025   APPEARANCEUR CLEAR 06/12/2020 1025   LABSPEC 1.011 06/12/2020 1025   PHURINE 6.0 06/12/2020 1025   GLUCOSEU NEGATIVE 06/12/2020 1025   HGBUR NEGATIVE 06/12/2020 1025   BILIRUBINUR NEGATIVE 06/12/2020 1025   BILIRUBINUR Negative 04/27/2019 0935   KETONESUR NEGATIVE 06/12/2020 1025   PROTEINUR NEGATIVE 06/12/2020 1025   UROBILINOGEN 1.0 04/27/2019 0935   NITRITE NEGATIVE 06/12/2020 1025   LEUKOCYTESUR NEGATIVE 06/12/2020 1025    Radiological Exams on Admission: No results found.  EKG: Not performed.  Assessment/Plan Principal Problem:   Sickle cell pain crisis (HCC)   Stephanie Burch is a 40 y.o. female with medical history significant for sickle cell disease, chronic pain syndrome, opiate dependence and tolerance who is admitted with sickle cell pain crisis.  Sickle cell pain crisis: -Start Dilaudid PCA pump -Start IV fluids 0.45% NS @ 100 mL/hour -Continue IV  Toradol 15 mg every 6 hours -Continue home Xtampza ER  Anemia of chronic disease: Hemoglobin stable, no indication for transfusion at this time.  DVT prophylaxis: Lovenox Code Status: Full code Family Communication: Discussed with patient, she has discussed with family Disposition Plan: From home and likely discharge to home pending adequate pain control Consults called: None Level of care: Med-Surg Admission status:  Status is: Observation  The patient remains OBS appropriate and will d/c before 2 midnights.  Dispo: The patient is from: Home              Anticipated d/c is to: Home              Patient currently is not medically stable to d/c.   Difficult to place patient No   Darreld Mclean MD [facility]  If 7PM-7AM, please contact night-coverage [email or web address]  09/05/2020, 9:09 PM

## 2020-09-05 NOTE — ED Triage Notes (Signed)
Pt reports sickle cell pain in bilateral legs and lower back.

## 2020-09-05 NOTE — ED Notes (Signed)
Patient requesting blood draw with IV start. 

## 2020-09-06 ENCOUNTER — Non-Acute Institutional Stay (HOSPITAL_COMMUNITY): Admission: RE | Admit: 2020-09-06 | Payer: Medicare Other | Source: Ambulatory Visit

## 2020-09-06 DIAGNOSIS — D5700 Hb-SS disease with crisis, unspecified: Secondary | ICD-10-CM | POA: Diagnosis present

## 2020-09-06 DIAGNOSIS — Z832 Family history of diseases of the blood and blood-forming organs and certain disorders involving the immune mechanism: Secondary | ICD-10-CM | POA: Diagnosis not present

## 2020-09-06 DIAGNOSIS — Z20822 Contact with and (suspected) exposure to covid-19: Secondary | ICD-10-CM | POA: Diagnosis present

## 2020-09-06 DIAGNOSIS — Z833 Family history of diabetes mellitus: Secondary | ICD-10-CM | POA: Diagnosis not present

## 2020-09-06 DIAGNOSIS — F1120 Opioid dependence, uncomplicated: Secondary | ICD-10-CM | POA: Diagnosis present

## 2020-09-06 DIAGNOSIS — Z8249 Family history of ischemic heart disease and other diseases of the circulatory system: Secondary | ICD-10-CM | POA: Diagnosis not present

## 2020-09-06 DIAGNOSIS — D72829 Elevated white blood cell count, unspecified: Secondary | ICD-10-CM | POA: Diagnosis not present

## 2020-09-06 DIAGNOSIS — G894 Chronic pain syndrome: Secondary | ICD-10-CM | POA: Diagnosis present

## 2020-09-06 DIAGNOSIS — Z8616 Personal history of COVID-19: Secondary | ICD-10-CM | POA: Diagnosis not present

## 2020-09-06 DIAGNOSIS — Z6841 Body Mass Index (BMI) 40.0 and over, adult: Secondary | ICD-10-CM | POA: Diagnosis not present

## 2020-09-06 DIAGNOSIS — Z79899 Other long term (current) drug therapy: Secondary | ICD-10-CM | POA: Diagnosis not present

## 2020-09-06 DIAGNOSIS — R001 Bradycardia, unspecified: Secondary | ICD-10-CM | POA: Diagnosis present

## 2020-09-06 LAB — SARS CORONAVIRUS 2 (TAT 6-24 HRS): SARS Coronavirus 2: NEGATIVE

## 2020-09-06 MED ORDER — SODIUM CHLORIDE 0.9 % IV SOLN
INTRAVENOUS | Status: DC | PRN
Start: 2020-09-06 — End: 2020-09-10
  Administered 2020-09-06: 500 mL via INTRAVENOUS

## 2020-09-06 MED ORDER — OXYCODONE HCL 5 MG PO TABS
20.0000 mg | ORAL_TABLET | ORAL | Status: DC | PRN
  Administered 2020-09-06 – 2020-09-10 (×21): 20 mg via ORAL
  Filled 2020-09-06 (×22): qty 4

## 2020-09-06 MED ORDER — HYDROMORPHONE 1 MG/ML IV SOLN
INTRAVENOUS | Status: DC
Start: 2020-09-06 — End: 2020-09-08
  Administered 2020-09-06: 5.5 mg via INTRAVENOUS
  Administered 2020-09-06: 7.5 mg via INTRAVENOUS
  Administered 2020-09-07: 9.5 mg via INTRAVENOUS
  Administered 2020-09-07: 7 mg via INTRAVENOUS
  Administered 2020-09-07: 8 mg via INTRAVENOUS
  Administered 2020-09-08: 2.5 mg via INTRAVENOUS
  Administered 2020-09-08: 5.5 mg via INTRAVENOUS
  Administered 2020-09-08: 2 mg via INTRAVENOUS
  Filled 2020-09-06 (×18): qty 30

## 2020-09-06 NOTE — Progress Notes (Signed)
Subjective: Stephanie Burch is a 40 year old female with a medical history significant for sickle cell disease, chronic pain syndrome, frequent hospitalizations, opiate dependence and tolerance, anemia of chronic disease, and morbid obesity that was admitted for sickle cell pain crisis. Patient continues to complain of significant pain to left upper extremity and bilateral lower extremities.  Pain intensity is 7/10 characterized as constant and aching.  Patient denies any chest pain, shortness of breath, urinary symptoms, nausea, vomiting, or diarrhea.  Objective:  Vital signs in last 24 hours:  Vitals:   09/06/20 0541 09/06/20 0543 09/06/20 0553 09/06/20 0846  BP: 126/81     Pulse: (!) 101     Resp: 16  16 14   Temp: 99.2 F (37.3 C)     TempSrc: Oral     SpO2: 97%  100% 97%  Weight:  (!) 139.7 kg    Height:  5\' 5"  (1.651 m)      Intake/Output from previous day:   Intake/Output Summary (Last 24 hours) at 09/06/2020 1008 Last data filed at 09/06/2020 0912 Gross per 24 hour  Intake 645 ml  Output --  Net 645 ml    Physical Exam: General: Alert, awake, oriented x3, in no acute distress.  HEENT: [place]/AT PEERL, EOMI Neck: Trachea midline,  no masses, no thyromegal,y no JVD, no carotid bruit OROPHARYNX:  Moist, No exudate/ erythema/lesions.  Heart: Regular rate and rhythm, without murmurs, rubs, gallops, PMI non-displaced, no heaves or thrills on palpation.  Lungs: Clear to auscultation, no wheezing or rhonchi noted. No increased vocal fremitus resonant to percussion  Abdomen: Soft, nontender, nondistended, positive bowel sounds, no masses no hepatosplenomegaly noted..  Neuro: No focal neurological deficits noted cranial nerves II through XII grossly intact. DTRs 2+ bilaterally upper and lower extremities. Strength 5 out of 5 in bilateral upper and lower extremities. Musculoskeletal: No warm swelling or erythema around joints, no spinal tenderness noted. Psychiatric: Patient alert and  oriented x3, good insight and cognition, good recent to remote recall. Lymph node survey: No cervical axillary or inguinal lymphadenopathy noted.  Lab Results:  Basic Metabolic Panel:    Component Value Date/Time   NA 142 09/05/2020 1113   NA 142 09/11/2017 1431   K 4.2 09/05/2020 1113   CL 105 09/05/2020 1113   CO2 27 09/05/2020 1113   BUN 7 09/05/2020 1113   BUN 8 09/11/2017 1431   CREATININE 0.47 09/05/2020 1113   GLUCOSE 102 (H) 09/05/2020 1113   CALCIUM 9.5 09/05/2020 1113   CBC:    Component Value Date/Time   WBC 11.9 (H) 09/05/2020 1113   HGB 12.3 09/05/2020 1113   HGB 13.0 09/11/2017 1431   HCT 35.9 (L) 09/05/2020 1113   HCT 38.4 09/11/2017 1431   PLT 323 09/05/2020 1113   PLT 258 09/11/2017 1431   MCV 67.7 (L) 09/05/2020 1113   MCV 75 (L) 09/11/2017 1431   NEUTROABS 9.1 (H) 09/05/2020 1113   NEUTROABS 3.9 09/11/2017 1431   LYMPHSABS 1.7 09/05/2020 1113   LYMPHSABS 2.2 09/11/2017 1431   MONOABS 0.7 09/05/2020 1113   EOSABS 0.3 09/05/2020 1113   EOSABS 0.1 09/11/2017 1431   BASOSABS 0.1 09/05/2020 1113   BASOSABS 0.0 09/11/2017 1431    Recent Results (from the past 240 hour(s))  SARS CORONAVIRUS 2 (TAT 6-24 HRS) Nasopharyngeal Nasopharyngeal Swab     Status: None   Collection Time: 09/05/20  8:26 PM   Specimen: Nasopharyngeal Swab  Result Value Ref Range Status   SARS Coronavirus 2 NEGATIVE NEGATIVE  Final    Comment: (NOTE) SARS-CoV-2 target nucleic acids are NOT DETECTED.  The SARS-CoV-2 RNA is generally detectable in upper and lower respiratory specimens during the acute phase of infection. Negative results do not preclude SARS-CoV-2 infection, do not rule out co-infections with other pathogens, and should not be used as the sole basis for treatment or other patient management decisions. Negative results must be combined with clinical observations, patient history, and epidemiological information. The expected result is Negative.  Fact Sheet for  Patients: HairSlick.no  Fact Sheet for Healthcare Providers: quierodirigir.com  This test is not yet approved or cleared by the [place] FDA and  has been authorized for detection and/or diagnosis of SARS-CoV-2 by FDA under an Emergency Use Authorization (EUA). This EUA will remain  in effect (meaning this test can be used) for the duration of the COVID-19 declaration under Se ction 564(b)(1) of the Act, 21 U.S.C. section 360bbb-3(b)(1), unless the authorization is terminated or revoked sooner.  Performed at [facility], [address], [place], [place] [address]     Studies/Results: No results found.  Medications: Scheduled Meds:  enoxaparin (LOVENOX) injection  40 mg Subcutaneous Q24H   folic acid  1 mg Oral Daily   HYDROmorphone   Intravenous Q4H   ketorolac  15 mg Intravenous Q6H   oxyCODONE  20 mg Oral Q12H   pneumococcal 23 valent vaccine  0.5 mL Intramuscular Tomorrow-1000   senna-docusate  1 tablet Oral BID   Continuous Infusions: PRN Meds:.acetaminophen, hydrOXYzine, naloxone **AND** sodium chloride flush, ondansetron **OR** ondansetron (ZOFRAN) IV, polyethylene glycol  Consultants: None  Procedures: None  Antibiotics: None  Assessment/Plan: Principal Problem:   Sickle cell pain crisis (HCC) Sickle cell disease with pain crisis: Weaning IV Dilaudid PCA, settings decreased today. Continue OxyContin 20 mg every 12 hours Oxycodone 20 mg every 4 hours as needed for severe breakthrough pain Toradol 15 mg IV every 6 hours Monitor vital signs very closely, reevaluate pain scale regularly, and supplemental oxygen as needed. Patient will be reevaluated for pain in the context of function and relationship to baseline as care progresses.  Chronic pain syndrome: Restart home medications  Anemia of chronic disease: Hemoglobin is stable and consistent with patient's baseline.  There is no clinical  indication for blood transfusion at this time.  Continue to follow closely.  Morbid obesity: Patient's BMI is 51.2.  Counseled at length about the importance of following a low-fat, low carbohydrate diet divided over small meals.  Also, increasing daily physical activity as pain improves.  Code Status: Full Code Family Communication: N/A Disposition Plan: Not yet ready for discharge  Keisi Eckford Rennis Petty  APRN, MSN, FNP-C [facility] [address] [place], [place] [address] [phone]  If 5PM-8AM, please contact night-coverage.  09/06/2020, 10:08 AM  LOS: 0 days

## 2020-09-06 NOTE — Plan of Care (Signed)
  Problem: Education: Goal: Knowledge of General Education information will improve Description Including pain rating scale, medication(s)/side effects and non-pharmacologic comfort measures Outcome: Progressing   

## 2020-09-07 DIAGNOSIS — D5700 Hb-SS disease with crisis, unspecified: Secondary | ICD-10-CM | POA: Diagnosis not present

## 2020-09-07 NOTE — Progress Notes (Signed)
Subjective: Stephanie Burch is a 40 year old female with a medical history significant for sickle cell disease, chronic pain syndrome, frequent hospitalization, opiate dependence and tolerance, anemia of chronic disease, her sickle cell pain crisis.  Patient is continues to complain of significant pain primarily to bilateral lower extremity and left upper extremity, any headache, chest pain or shortness of, urinary symptoms, nausea, vomiting.  Objective:  Vital signs in last 24 hours:  Vitals:   09/07/20 0352 09/07/20 0352 09/07/20 0752 09/07/20 0917  BP:  125/76  (!) 141/85  Pulse:  100  (!) 102  Resp: 17 17 16 18   Temp:  98.1 F (36.7 C)    TempSrc:  Oral    SpO2: 99% 96% 98% 100%  Weight:      Height:        Intake/Output from previous day:   Intake/Output Summary (Last 24 hours) at 09/07/2020 1013 Last data filed at 09/07/2020 0537 Gross per 24 hour  Intake 1219.48 ml  Output --  Net 1219.48 ml    Physical Exam: General: Alert, awake, oriented x3, in no acute distress.  HEENT: [place]/AT PEERL, EOMI Neck: Trachea midline,  no masses, no thyromegal,y no JVD, no carotid bruit OROPHARYNX:  Moist, No exudate/ erythema/lesions.  Heart: Regular rate and rhythm, without murmurs, rubs, gallops, PMI non-displaced, no heaves or thrills on palpation.  Lungs: Clear to auscultation, no wheezing or rhonchi noted. No increased vocal fremitus resonant to percussion  Abdomen: Soft, nontender, nondistended, positive bowel sounds, no masses no hepatosplenomegaly noted..  Neuro: No focal neurological deficits noted cranial nerves II through XII grossly intact. DTRs 2+ bilaterally upper and lower extremities. Strength 5 out of 5 in bilateral upper and lower extremities. Musculoskeletal: No warm swelling or erythema around joints, no spinal tenderness noted. Psychiatric: Patient alert and oriented x3, good insight and cognition, good recent to remote recall. Lymph node survey: No cervical axillary or  inguinal lymphadenopathy noted.  Lab Results:  Basic Metabolic Panel:    Component Value Date/Time   NA 142 09/05/2020 1113   NA 142 09/11/2017 1431   K 4.2 09/05/2020 1113   CL 105 09/05/2020 1113   CO2 27 09/05/2020 1113   BUN 7 09/05/2020 1113   BUN 8 09/11/2017 1431   CREATININE 0.47 09/05/2020 1113   GLUCOSE 102 (H) 09/05/2020 1113   CALCIUM 9.5 09/05/2020 1113   CBC:    Component Value Date/Time   WBC 11.9 (H) 09/05/2020 1113   HGB 12.3 09/05/2020 1113   HGB 13.0 09/11/2017 1431   HCT 35.9 (L) 09/05/2020 1113   HCT 38.4 09/11/2017 1431   PLT 323 09/05/2020 1113   PLT 258 09/11/2017 1431   MCV 67.7 (L) 09/05/2020 1113   MCV 75 (L) 09/11/2017 1431   NEUTROABS 9.1 (H) 09/05/2020 1113   NEUTROABS 3.9 09/11/2017 1431   LYMPHSABS 1.7 09/05/2020 1113   LYMPHSABS 2.2 09/11/2017 1431   MONOABS 0.7 09/05/2020 1113   EOSABS 0.3 09/05/2020 1113   EOSABS 0.1 09/11/2017 1431   BASOSABS 0.1 09/05/2020 1113   BASOSABS 0.0 09/11/2017 1431    Recent Results (from the past 240 hour(s))  SARS CORONAVIRUS 2 (TAT 6-24 HRS) Nasopharyngeal Nasopharyngeal Swab     Status: None   Collection Time: 09/05/20  8:26 PM   Specimen: Nasopharyngeal Swab  Result Value Ref Range Status   SARS Coronavirus 2 NEGATIVE NEGATIVE Final    Comment: (NOTE) SARS-CoV-2 target nucleic acids are NOT DETECTED.  The SARS-CoV-2 RNA is generally detectable in  upper and lower respiratory specimens during the acute phase of infection. Negative results do not preclude SARS-CoV-2 infection, do not rule out co-infections with other pathogens, and should not be used as the sole basis for treatment or other patient management decisions. Negative results must be combined with clinical observations, patient history, and epidemiological information. The expected result is Negative.  Fact Sheet for Patients: HairSlick.no  Fact Sheet for Healthcare  Providers: quierodirigir.com  This test is not yet approved or cleared by the [place] FDA and  has been authorized for detection and/or diagnosis of SARS-CoV-2 by FDA under an Emergency Use Authorization (EUA). This EUA will remain  in effect (meaning this test can be used) for the duration of the COVID-19 declaration under Se ction 564(b)(1) of the Act, 21 U.S.C. section 360bbb-3(b)(1), unless the authorization is terminated or revoked sooner.  Performed at [facility], [address], [place], [place] [address]     Studies/Results: No results found.  Medications: Scheduled Meds:  enoxaparin (LOVENOX) injection  40 mg Subcutaneous Q24H   folic acid  1 mg Oral Daily   HYDROmorphone   Intravenous Q4H   ketorolac  15 mg Intravenous Q6H   oxyCODONE  20 mg Oral Q12H   pneumococcal 23 valent vaccine  0.5 mL Intramuscular Tomorrow-1000   senna-docusate  1 tablet Oral BID   Continuous Infusions:  sodium chloride 500 mL (09/06/20 1703)   PRN Meds:.sodium chloride, acetaminophen, hydrOXYzine, naloxone **AND** sodium chloride flush, ondansetron **OR** ondansetron (ZOFRAN) IV, oxyCODONE, polyethylene glycol  Consultants: None  Procedures: None  Antibiotics: None  Assessment/Plan: Principal Problem:   Sickle cell pain crisis (HCC) Active Problems:   Morbid obesity with BMI of 50.0-59.9, adult (HCC)   Chronic pain syndrome  Sickle cell disease with pain crisis:  Weaning IV Dilaudid PCA Continue OxyContin 20 mg every 12 hours Oxycodone 20 mg every 4 hours as needed for severe breakthrough pain Toradol 15 mg IV every 6 hours Monitor vital signs very closely, reevaluate pain scale regularly, and supplemental oxygen as needed Bradycardia Chronic pain syndrome: Restart home medications  Anemia of chronic disease: Hemoglobin is stable and consistent with patient's baseline.  There is no clinical indication for blood transfusion at this  time.  Continue to follow closely.  Morbid obesity: Patient counseled at length about the importance of following a low-fat, low carbohydrate diet divided over small meals throughout the day.  Also, increasing daily physical activity with as pain intensity improves. Code Status: Full Code Family Communication: N/A Disposition Plan: Not yet ready for discharge  Mohid Furuya Rennis Petty  APRN, MSN, FNP-C [facility] [address] [place], [place] [address] [phone]  If 7PM-7AM, please contact night-coverage.  09/07/2020, 10:13 AM  LOS: 1 day

## 2020-09-07 NOTE — Plan of Care (Signed)
  Problem: Pain Managment: Goal: General experience of comfort will improve Outcome: Progressing   Problem: Safety: Goal: Ability to remain free from injury will improve Outcome: Progressing   

## 2020-09-07 NOTE — Plan of Care (Signed)
  Problem: Coping: Goal: Level of anxiety will decrease Outcome: Progressing   Problem: Elimination: Goal: Will not experience complications related to bowel motility Outcome: Progressing   Problem: Pain Managment: Goal: General experience of comfort will improve Outcome: Progressing   Problem: Safety: Goal: Ability to remain free from injury will improve Outcome: Progressing   

## 2020-09-07 NOTE — Progress Notes (Signed)
PCA syringe changed and 72ml dilaudid wasted with Kathee Polite, RN

## 2020-09-08 DIAGNOSIS — Z6841 Body Mass Index (BMI) 40.0 and over, adult: Secondary | ICD-10-CM | POA: Diagnosis not present

## 2020-09-08 DIAGNOSIS — G894 Chronic pain syndrome: Secondary | ICD-10-CM | POA: Diagnosis not present

## 2020-09-08 DIAGNOSIS — D5700 Hb-SS disease with crisis, unspecified: Secondary | ICD-10-CM | POA: Diagnosis not present

## 2020-09-08 MED ORDER — HYDROMORPHONE 1 MG/ML IV SOLN
INTRAVENOUS | Status: DC
  Administered 2020-09-08: 8.9 mg via INTRAVENOUS
  Administered 2020-09-09: 6.6 mg via INTRAVENOUS
  Administered 2020-09-09: 8.4 mg via INTRAVENOUS
  Filled 2020-09-08 (×4): qty 30

## 2020-09-08 NOTE — Plan of Care (Signed)
  Problem: Clinical Measurements: Goal: Diagnostic test results will improve Outcome: Progressing   Problem: Clinical Measurements: Goal: Cardiovascular complication will be avoided Outcome: Progressing   Problem: Activity: Goal: Risk for activity intolerance will decrease Outcome: Progressing   Problem: Pain Managment: Goal: General experience of comfort will improve Outcome: Progressing

## 2020-09-08 NOTE — Progress Notes (Signed)
1mg  of Hydromorphone PCA wasted in stericycle with Verenicia RN at 2101.

## 2020-09-08 NOTE — Plan of Care (Signed)

## 2020-09-08 NOTE — Progress Notes (Signed)
Patient ID: Stephanie Burch, female   DOB: [birth year], 40 y.o.   MRN: [id number] Subjective: Stephanie Burch is a 40 year old female with a medical history significant for sickle cell disease, chronic pain syndrome, frequent hospitalization, opiate dependence and tolerance, anemia of chronic disease, her sickle cell pain crisis.  Patient continues to endorse significant pain in her lower extremities bilaterally, she said her pain is staying at 8/10 and she just wanted something to take the edges of. She denies any fever, headache, cough, chest pain, shortness of breath, nausea, vomiting or diarrhea. She denies any urinary symptoms. She claims that she is ambulating.  Objective:  Vital signs in last 24 hours:  Vitals:   09/08/20 0703 09/08/20 0801 09/08/20 1037 09/08/20 1111  BP:   (!) 149/76   Pulse:   (!) 108   Resp: 18 18 19 18   Temp:   98.1 F (36.7 C)   TempSrc:   Oral   SpO2: 100% 100% 100% 100%  Weight:      Height:        Intake/Output from previous day:   Intake/Output Summary (Last 24 hours) at 09/08/2020 1240 Last data filed at 09/08/2020 0935 Gross per 24 hour  Intake 1355.78 ml  Output --  Net 1355.78 ml   Physical Exam: General: Alert, awake, oriented x3, in no acute distress.  Obese HEENT: [place]/AT PEERL, EOMI Neck: Trachea midline,  no masses, no thyromegal,y no JVD, no carotid bruit OROPHARYNX:  Moist, No exudate/ erythema/lesions.  Heart: Regular rate and rhythm, without murmurs, rubs, gallops, PMI non-displaced, no heaves or thrills on palpation.  Lungs: Clear to auscultation, no wheezing or rhonchi noted. No increased vocal fremitus resonant to percussion  Abdomen: Soft, nontender, nondistended, positive bowel sounds, no masses no hepatosplenomegaly noted..  Neuro: No focal neurological deficits noted cranial nerves II through XII grossly intact. DTRs 2+ bilaterally upper and lower extremities. Strength 5 out of 5 in bilateral upper and lower  extremities. Musculoskeletal: No warm swelling or erythema around joints, no spinal tenderness noted. Psychiatric: Patient alert and oriented x3, good insight and cognition, good recent to remote recall. Lymph node survey: No cervical axillary or inguinal lymphadenopathy noted.  Lab Results:  Basic Metabolic Panel:    Component Value Date/Time   NA 142 09/05/2020 1113   NA 142 09/11/2017 1431   K 4.2 09/05/2020 1113   CL 105 09/05/2020 1113   CO2 27 09/05/2020 1113   BUN 7 09/05/2020 1113   BUN 8 09/11/2017 1431   CREATININE 0.47 09/05/2020 1113   GLUCOSE 102 (H) 09/05/2020 1113   CALCIUM 9.5 09/05/2020 1113   CBC:    Component Value Date/Time   WBC 11.9 (H) 09/05/2020 1113   HGB 12.3 09/05/2020 1113   HGB 13.0 09/11/2017 1431   HCT 35.9 (L) 09/05/2020 1113   HCT 38.4 09/11/2017 1431   PLT 323 09/05/2020 1113   PLT 258 09/11/2017 1431   MCV 67.7 (L) 09/05/2020 1113   MCV 75 (L) 09/11/2017 1431   NEUTROABS 9.1 (H) 09/05/2020 1113   NEUTROABS 3.9 09/11/2017 1431   LYMPHSABS 1.7 09/05/2020 1113   LYMPHSABS 2.2 09/11/2017 1431   MONOABS 0.7 09/05/2020 1113   EOSABS 0.3 09/05/2020 1113   EOSABS 0.1 09/11/2017 1431   BASOSABS 0.1 09/05/2020 1113   BASOSABS 0.0 09/11/2017 1431    Recent Results (from the past 240 hour(s))  SARS CORONAVIRUS 2 (TAT 6-24 HRS) Nasopharyngeal Nasopharyngeal Swab     Status: None   Collection Time: 09/05/20  8:26 PM   Specimen: Nasopharyngeal Swab  Result Value Ref Range Status   SARS Coronavirus 2 NEGATIVE NEGATIVE Final    Comment: (NOTE) SARS-CoV-2 target nucleic acids are NOT DETECTED.  The SARS-CoV-2 RNA is generally detectable in upper and lower respiratory specimens during the acute phase of infection. Negative results do not preclude SARS-CoV-2 infection, do not rule out co-infections with other pathogens, and should not be used as the sole basis for treatment or other patient management decisions. Negative results must be  combined with clinical observations, patient history, and epidemiological information. The expected result is Negative.  Fact Sheet for Patients: HairSlick.no  Fact Sheet for Healthcare Providers: quierodirigir.com  This test is not yet approved or cleared by the [place] FDA and  has been authorized for detection and/or diagnosis of SARS-CoV-2 by FDA under an Emergency Use Authorization (EUA). This EUA will remain  in effect (meaning this test can be used) for the duration of the COVID-19 declaration under Se ction 564(b)(1) of the Act, 21 U.S.C. section 360bbb-3(b)(1), unless the authorization is terminated or revoked sooner.  Performed at [facility], [address] [place], [place] [address]     Studies/Results: No results found.  Medications: Scheduled Meds:  enoxaparin (LOVENOX) injection  40 mg Subcutaneous Q24H   folic acid  1 mg Oral Daily   HYDROmorphone   Intravenous Q4H   ketorolac  15 mg Intravenous Q6H   oxyCODONE  20 mg Oral Q12H   pneumococcal 23 valent vaccine  0.5 mL Intramuscular Tomorrow-1000   senna-docusate  1 tablet Oral BID   Continuous Infusions:  sodium chloride 10 mL/hr at 09/08/20 0935   PRN Meds:.sodium chloride, acetaminophen, hydrOXYzine, naloxone **AND** sodium chloride flush, ondansetron **OR** ondansetron (ZOFRAN) IV, oxyCODONE, polyethylene glycol  Consultants: None  Procedures: None  Antibiotics: None  Assessment/Plan: Principal Problem:   Sickle cell pain crisis (HCC) Active Problems:   Morbid obesity with BMI of 50.0-59.9, adult (HCC)   Chronic pain syndrome  Hb Sickle Cell Disease with crisis: Continue IV[facility] at [facility], increase weight based Dilaudid PCA to 0.6/10/4.2 for the next 24 hours, continue IV Toradol 15 mg Q 6 H for total of 5 days, continue oral home medications as ordered.  Monitor vitals very closely, Re-evaluate pain scale regularly, 2 L of Oxygen  by [place]. Leukocytosis: Mild. Most likely related to sickle cell pain crisis. We will continue to observe. 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  There is no clinical indication for blood transfusion today.  We will continue to monitor very closely. Chronic pain Syndrome: Continue home medications as ordered. Morbid obesity: Patient counseled extensively about nutrition and exercise as tolerated. Patient may need referral for nutrition assistant as outpatient.  Code Status: Full Code Family Communication: N/A Disposition Plan: Not yet ready for discharge  Stephanie Burch  If 7PM-7AM, please contact night-coverage.  09/08/2020, 12:40 PM  LOS: 2 days

## 2020-09-08 NOTE — Progress Notes (Signed)
2mg  of Hydromorphone PCA wasted with RN at [phone] in stericycle.

## 2020-09-09 DIAGNOSIS — Z6841 Body Mass Index (BMI) 40.0 and over, adult: Secondary | ICD-10-CM | POA: Diagnosis not present

## 2020-09-09 DIAGNOSIS — D5700 Hb-SS disease with crisis, unspecified: Secondary | ICD-10-CM | POA: Diagnosis not present

## 2020-09-09 DIAGNOSIS — G894 Chronic pain syndrome: Secondary | ICD-10-CM | POA: Diagnosis not present

## 2020-09-09 LAB — BASIC METABOLIC PANEL
Anion gap: 5 (ref 5–15)
BUN: 9 mg/dL (ref 6–20)
CO2: 29 mmol/L (ref 22–32)
Calcium: 8.8 mg/dL — ABNORMAL LOW (ref 8.9–10.3)
Chloride: 105 mmol/L (ref 98–111)
Creatinine, Ser: 0.57 mg/dL (ref 0.44–1.00)
GFR, Estimated: >60 mL/min (ref >60)
Glucose, Bld: 129 mg/dL — ABNORMAL HIGH (ref 70–99)
Potassium: 4.2 mmol/L (ref 3.5–5.1)
Sodium: 139 mmol/L (ref 135–145)

## 2020-09-09 LAB — CBC WITH DIFFERENTIAL/PLATELET
Abs Immature Granulocytes: 0.05 10*3/uL (ref 0.00–0.07)
Basophils Absolute: 0.1 10*3/uL (ref 0.0–0.1)
Basophils Relative: 1 %
Eosinophils Absolute: 0.9 10*3/uL — ABNORMAL HIGH (ref 0.0–0.5)
Eosinophils Relative: 8 %
HCT: 28.8 % — ABNORMAL LOW (ref 36.0–46.0)
Hemoglobin: 9.6 g/dL — ABNORMAL LOW (ref 12.0–15.0)
Immature Granulocytes: 1 %
Lymphocytes Relative: 29 %
Lymphs Abs: 3.2 10*3/uL (ref 0.7–4.0)
MCH: 23.1 pg — ABNORMAL LOW (ref 26.0–34.0)
MCHC: 33.3 g/dL (ref 30.0–36.0)
MCV: 69.4 fL — ABNORMAL LOW (ref 80.0–100.0)
Monocytes Absolute: 0.8 10*3/uL (ref 0.1–1.0)
Monocytes Relative: 7 %
Neutro Abs: 6 10*3/uL (ref 1.7–7.7)
Neutrophils Relative %: 54 %
Platelets: 200 10*3/uL (ref 150–400)
RBC: 4.15 MIL/uL (ref 3.87–5.11)
RDW: 15.7 % — ABNORMAL HIGH (ref 11.5–15.5)
WBC: 10.9 10*3/uL — ABNORMAL HIGH (ref 4.0–10.5)
nRBC: 0.9 % — ABNORMAL HIGH (ref 0.0–0.2)

## 2020-09-09 MED ORDER — HYDROMORPHONE 1 MG/ML IV SOLN
INTRAVENOUS | Status: DC
  Administered 2020-09-09: 9.8 mg via INTRAVENOUS

## 2020-09-09 NOTE — Plan of Care (Signed)

## 2020-09-09 NOTE — Progress Notes (Signed)
Pt not ready for d/c at this time due to continued pain. She continues to need to use the pca for pain control at current settings. She is concerned that she would like to be out early in the morning. However Dr. Hyman Hopes when notified of pt staying stated she needs to wean off of her pca properly prior to going home. Rn will discuss this with patient.

## 2020-09-09 NOTE — Progress Notes (Signed)
Pt stable though complains of upper right leg pain. No abnormalities noted on leg though pt states she notices swelling when looking at it. Night time Rn will continue to monitor.

## 2020-09-09 NOTE — Plan of Care (Signed)
  Problem: Elimination: Goal: Will not experience complications related to bowel motility 09/09/2020 0724 by Sherren Kerns, RN Outcome: Progressing 09/09/2020 0724 by Sherren Kerns, RN Outcome: Progressing   Problem: Coping: Goal: Level of anxiety will decrease 09/09/2020 0724 by Sherren Kerns, RN Outcome: Progressing 09/09/2020 0724 by Sherren Kerns, RN Outcome: Progressing   Problem: Pain Managment: Goal: General experience of comfort will improve 09/09/2020 0724 by Sherren Kerns, RN Outcome: Progressing 09/09/2020 0724 by Sherren Kerns, RN Outcome: Progressing   Problem: Safety: Goal: Ability to remain free from injury will improve 09/09/2020 0724 by Sherren Kerns, RN Outcome: Progressing 09/09/2020 0724 by Sherren Kerns, RN Outcome: Progressing   Problem: Skin Integrity: Goal: Risk for impaired skin integrity will decrease 09/09/2020 0724 by Sherren Kerns, RN Outcome: Progressing 09/09/2020 0724 by Sherren Kerns, RN Outcome: Progressing

## 2020-09-09 NOTE — Plan of Care (Signed)
  Problem: Clinical Measurements: Goal: Cardiovascular complication will be avoided Outcome: Progressing   Problem: Activity: Goal: Risk for activity intolerance will decrease Outcome: Progressing   Problem: Nutrition: Goal: Adequate nutrition will be maintained Outcome: Progressing   Problem: Safety: Goal: Ability to remain free from injury will improve Outcome: Progressing   Problem: Skin Integrity: Goal: Risk for impaired skin integrity will decrease Outcome: Progressing   

## 2020-09-09 NOTE — Progress Notes (Signed)
IV Dilaudid replacement syringe tubed down from pharmacy due to IV Dilaudid syringe being "grayed out" in both pyxis here on [address]. What was left of the other IV Dilaudid syringe (0.3 ml) was wasted in the steri cycle with second witness being Margrett Rud, RN.

## 2020-09-09 NOTE — Progress Notes (Signed)
Patient ID: Stephanie Burch, female   DOB: September 26, 1980, 40 y.o.   MRN: [id number] Subjective: Stephanie Burch is a 40 year old female with a medical history significant for sickle cell disease, chronic pain syndrome, frequent hospitalization, opiate dependence and tolerance, anemia of chronic disease, her sickle cell pain crisis.  Patient claims her pain is slightly better but is still not at baseline, she feels she needs 1 more day in the hospital for better control.  Her pain is primarily in her lower extremities, no swelling or redness.  She denies any fever, cough, chest pain, shortness of breath, nausea, vomiting or diarrhea.  She has no headache or visual disturbance.  Objective:  Vital signs in last 24 hours:  Vitals:   09/09/20 0738 09/09/20 0946 09/09/20 1110 09/09/20 1113  BP:  (!) 142/76    Pulse:  96    Resp: 18 18 19 18   Temp:      TempSrc:      SpO2: 99% 100% 100% 100%  Weight:      Height:        Intake/Output from previous day:   Intake/Output Summary (Last 24 hours) at 09/09/2020 1400 Last data filed at 09/09/2020 1309 Gross per 24 hour  Intake 2760.58 ml  Output --  Net 2760.58 ml    Physical Exam: General: Alert, awake, oriented x3, in no acute distress.  Obese HEENT: [place]/AT PEERL, EOMI Neck: Trachea midline,  no masses, no thyromegal,y no JVD, no carotid bruit OROPHARYNX:  Moist, No exudate/ erythema/lesions.  Heart: Regular rate and rhythm, without murmurs, rubs, gallops, PMI non-displaced, no heaves or thrills on palpation.  Lungs: Clear to auscultation, no wheezing or rhonchi noted. No increased vocal fremitus resonant to percussion  Abdomen: Soft, nontender, nondistended, positive bowel sounds, no masses no hepatosplenomegaly noted..  Neuro: No focal neurological deficits noted cranial nerves II through XII grossly intact. DTRs 2+ bilaterally upper and lower extremities. Strength 5 out of 5 in bilateral upper and lower extremities. Musculoskeletal: No warm  swelling or erythema around joints, no spinal tenderness noted. Psychiatric: Patient alert and oriented x3, good insight and cognition, good recent to remote recall. Lymph node survey: No cervical axillary or inguinal lymphadenopathy noted.  Lab Results:  Basic Metabolic Panel:    Component Value Date/Time   NA 139 09/09/2020 1120   NA 142 09/11/2017 1431   K 4.2 09/09/2020 1120   CL 105 09/09/2020 1120   CO2 29 09/09/2020 1120   BUN 9 09/09/2020 1120   BUN 8 09/11/2017 1431   CREATININE 0.57 09/09/2020 1120   GLUCOSE 129 (H) 09/09/2020 1120   CALCIUM 8.8 (L) 09/09/2020 1120   CBC:    Component Value Date/Time   WBC 10.9 (H) 09/09/2020 1120   HGB 9.6 (L) 09/09/2020 1120   HGB 13.0 09/11/2017 1431   HCT 28.8 (L) 09/09/2020 1120   HCT 38.4 09/11/2017 1431   PLT 200 09/09/2020 1120   PLT 258 09/11/2017 1431   MCV 69.4 (L) 09/09/2020 1120   MCV 75 (L) 09/11/2017 1431   NEUTROABS 6.0 09/09/2020 1120   NEUTROABS 3.9 09/11/2017 1431   LYMPHSABS 3.2 09/09/2020 1120   LYMPHSABS 2.2 09/11/2017 1431   MONOABS 0.8 09/09/2020 1120   EOSABS 0.9 (H) 09/09/2020 1120   EOSABS 0.1 09/11/2017 1431   BASOSABS 0.1 09/09/2020 1120   BASOSABS 0.0 09/11/2017 1431    Recent Results (from the past 240 hour(s))  SARS CORONAVIRUS 2 (TAT 6-24 HRS) Nasopharyngeal Nasopharyngeal Swab     Status:  None   Collection Time: 09/05/20  8:26 PM   Specimen: Nasopharyngeal Swab  Result Value Ref Range Status   SARS Coronavirus 2 NEGATIVE NEGATIVE Final    Comment: (NOTE) SARS-CoV-2 target nucleic acids are NOT DETECTED.  The SARS-CoV-2 RNA is generally detectable in upper and lower respiratory specimens during the acute phase of infection. Negative results do not preclude SARS-CoV-2 infection, do not rule out co-infections with other pathogens, and should not be used as the sole basis for treatment or other patient management decisions. Negative results must be combined with clinical  observations, patient history, and epidemiological information. The expected result is Negative.  Fact Sheet for Patients: HairSlick.no  Fact Sheet for Healthcare Providers: quierodirigir.com  This test is not yet approved or cleared by the [place] FDA and  has been authorized for detection and/or diagnosis of SARS-CoV-2 by FDA under an Emergency Use Authorization (EUA). This EUA will remain  in effect (meaning this test can be used) for the duration of the COVID-19 declaration under Se ction 564(b)(1) of the Act, 21 U.S.C. section 360bbb-3(b)(1), unless the authorization is terminated or revoked sooner.  Performed at [facility], [address] [place], [place] [address]     Studies/Results: No results found.  Medications: Scheduled Meds:  enoxaparin (LOVENOX) injection  40 mg Subcutaneous Q24H   folic acid  1 mg Oral Daily   HYDROmorphone   Intravenous Q4H   ketorolac  15 mg Intravenous Q6H   oxyCODONE  20 mg Oral Q12H   pneumococcal 23 valent vaccine  0.5 mL Intramuscular Tomorrow-1000   senna-docusate  1 tablet Oral BID   Continuous Infusions:  sodium chloride 10 mL/hr at 09/09/20 1309   PRN Meds:.sodium chloride, acetaminophen, hydrOXYzine, naloxone **AND** sodium chloride flush, ondansetron **OR** ondansetron (ZOFRAN) IV, oxyCODONE, polyethylene glycol  Consultants: None  Procedures: None  Antibiotics: None  Assessment/Plan: Principal Problem:   Sickle cell pain crisis (HCC) Active Problems:   Morbid obesity with BMI of 50.0-59.9, adult (HCC)   Chronic pain syndrome  Hb Sickle Cell Disease with crisis: Continue IVF at [facility], begin to wean weight based Dilaudid PCA in anticipation for discharge tomorrow. Continue IV Toradol 15 mg Q 6 H for total of 5 days, continue oral home medications as ordered. Monitor vitals very closely, Re-evaluate pain scale regularly, 2 L of Oxygen by  [place]. Leukocytosis: Mild and improving. Most likely related to sickle cell pain crisis. We will continue to observe. 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 There is no clinical indication for blood transfusion today. We will continue to monitor very closely. Chronic pain Syndrome: Continue home medications as ordered. Morbid obesity: Patient counseled extensively about nutrition and exercise as tolerated. Patient may need referral for nutrition assistant as outpatient.  Code Status: Full Code Family Communication: N/A Disposition Plan: Not yet ready for discharge  Geovonni Meyerhoff  If 7PM-7AM, please contact night-coverage.  09/09/2020, 2:00 PM  LOS: 3 days

## 2020-09-09 NOTE — Progress Notes (Signed)
1.5mg  of Hydromorphone pca wasted with Towanda Malkin Rn in [place].

## 2020-09-10 DIAGNOSIS — Z6841 Body Mass Index (BMI) 40.0 and over, adult: Secondary | ICD-10-CM | POA: Diagnosis not present

## 2020-09-10 DIAGNOSIS — G894 Chronic pain syndrome: Secondary | ICD-10-CM | POA: Diagnosis not present

## 2020-09-10 DIAGNOSIS — D5700 Hb-SS disease with crisis, unspecified: Secondary | ICD-10-CM | POA: Diagnosis not present

## 2020-09-10 LAB — RESP PANEL BY RT-PCR (FLU A&B, COVID) ARPGX2
Influenza A by PCR: NEGATIVE
Influenza B by PCR: NEGATIVE
SARS Coronavirus 2 by RT PCR: NEGATIVE

## 2020-09-10 MED ORDER — ONDANSETRON HCL 4 MG PO TABS: 4.0000 mg | ORAL_TABLET | Freq: Three times a day (TID) | ORAL | 0 refills | Status: DC | PRN

## 2020-09-10 NOTE — Discharge Summary (Signed)
Physician Discharge Summary  Stephanie Burch MAU:[id number] DOB: [birth year] DOA: 09/05/2020  PCP: Massie Maroon, FNP  Admit date: 09/05/2020  Discharge date: 09/10/2020  Discharge Diagnoses:  Principal Problem:   Sickle cell pain crisis [facility]) Active Problems:   Morbid obesity with BMI of 50.0-59.9, adult (HCC)   Chronic pain syndrome  Discharge Condition: Stable  Disposition:   Follow-up Information     Massie Maroon, FNP. Schedule an appointment as soon as possible for a visit in 1 week(s).   Specialty: Family Medicine Contact information: [address] [place] [address] [phone]                Pt is discharged home in good condition and is to follow up with Massie Maroon, FNP this week to have labs evaluated. Stephanie Burch is instructed to increase activity slowly and balance with rest for the next few days, and use prescribed medication to complete treatment of pain  Diet: Regular Wt Readings from Last 3 Encounters:  09/06/20 (!) 139.7 kg  08/21/20 (!) 141.1 kg  06/16/20 128.9 kg    History of present illness:  Stephanie Burch is a 40 y.o. female with medical history significant for sickle cell disease, chronic pain syndrome, opiate dependence and tolerance who presented to the ED for evaluation of lower extremity pain similar to her prior sickle cell pain crisis episodes.   Patient reports 2 days of lower extremity and left upper extremity pain similar to her prior sickle cell pain crisis episodes.  She has been taking her home Xtampza ER twice daily and oxycodone 20 mg as needed up to 4 times per day without significant relief.  She called in to be seen at the sickle cell clinic earlier today however was notified the office was closed and she subsequently came to the ED for further evaluation.  She otherwise denies any chest pain, cough, dyspnea, nausea, vomiting, abdominal pain, dysuria, lower extremity swelling.   ED Course:  Initial  vitals show BP 157/75, pulse 98, RR 18, temp 98.1 F, SPO2 99% on room air.   Labs show WBC 11.9, hemoglobin 12.3, platelets 323,000, sodium 142, potassium 4.2, bicarb 27, BUN 7, creatinine 0.47, serum glucose 102, LFTs within normal limits, i-STAT beta-hCG <5.0.   SARS-CoV-2 PCR collected and pending.   Patient was given IV Dilaudid 2 mg x 2, IV Toradol 15 milligrams x1, and oral Oxy IR 15 mg once.  The hospitalist service was consulted to admit for further management of sickle cell pain crisis.  Hospital Course:  Patient was admitted for sickle cell pain crisis and managed appropriately with IVF, IV Dilaudid via PCA and IV Toradol, as well as other adjunct therapies per sickle cell pain management protocols.  Patient did well on above regimen, pain slowly improved and she was subsequently weaned off PCA successfully and transition to her oral pain medications.  As at today her pain is back to baseline, she is ambulating well with no restrictions and tolerating p.o. intake appropriately.  Her hemoglobin remained stable at baseline throughout this admission, she did not require blood transfusion. Patient was therefore discharged home today in a hemodynamically stable condition.   Stephanie Burch will follow-up with PCP within 1 week of this discharge. Stephanie Burch was counseled extensively about nonpharmacologic means of pain management, patient verbalized understanding and was appreciative of  the care received during this admission.   We discussed the need for good hydration, monitoring of hydration status, avoidance of heat, cold,  stress, and infection triggers. We discussed the need to be adherent with taking Hydrea and other home medications. Patient was reminded of the need to seek medical attention immediately if any symptom of bleeding, anemia, or infection occurs.  Discharge Exam: Vitals:   09/10/20 0826 09/10/20 0920  BP:  130/76  Pulse:  (!) 108  Resp: 17 17  Temp:  98.3 F (36.8 C)  SpO2: 100%  99%   Vitals:   09/10/20 0411 09/10/20 0640 09/10/20 0826 09/10/20 0920  BP:  133/80  130/76  Pulse:  (!) 105  (!) 108  Resp: 18 15 17 17   Temp:  98.6 F (37 C)  98.3 F (36.8 C)  TempSrc:  Oral    SpO2: 97% 97% 100% 99%  Weight:      Height:        General appearance : Awake, alert, not in any distress. Speech Clear. Not toxic looking.  Morbidly obese HEENT: Atraumatic and Normocephalic, pupils equally reactive to light and accomodation Neck: Supple, no JVD. No cervical lymphadenopathy.  Chest: Good air entry bilaterally, no added sounds  CVS: S1 S2 regular, no murmurs.  Abdomen: Bowel sounds present, Non tender and not distended with no gaurding, rigidity or rebound. Extremities: B/L Lower Ext shows no edema, both legs are warm to touch Neurology: Awake alert, and oriented X 3, CN II-XII intact, Non focal Skin: No Rash  Discharge Instructions  Discharge Instructions     Diet - low sodium heart healthy   Complete by: As directed    Increase activity slowly   Complete by: As directed       Allergies as of 09/10/2020   No Known Allergies      Medication List     TAKE these medications    crizanlizumab-tmca 100 MG/10ML Soln Commonly known as: ADAKVEO Inject 680 mg into the vein every 30 (thirty) days.   folic acid 1 MG tablet Commonly known as: FOLVITE Take 1 tablet (1 mg total) by mouth daily.   gabapentin 300 MG capsule Commonly known as: NEURONTIN Take 300 mg by mouth 3 (three) times daily. What changed: Another medication with the same name was removed. Continue taking this medication, and follow the directions you see here.   ibuprofen 800 MG tablet Commonly known as: ADVIL Take 1 tablet (800 mg total) by mouth every 8 (eight) hours as needed for headache, mild pain, moderate pain or cramping.   ondansetron 4 MG tablet Commonly known as: ZOFRAN Take 1 tablet (4 mg total) by mouth every 8 (eight) hours as needed for nausea or vomiting.   Oxycodone  HCl 20 MG Tabs Take 1 tablet (20 mg total) by mouth every 4 (four) hours as needed (pain).   Xtampza ER 18 MG C12a Generic drug: oxyCODONE ER Take 18 mg by mouth every 12 (twelve) hours.       ASK your doctor about these medications    acetaminophen 500 MG tablet Commonly known as: TYLENOL Take 2 tablets (1,000 mg total) by mouth every 6 (six) hours as needed.   valACYclovir 500 MG tablet Commonly known as: Valtrex Take Valtrex 500 mg daily for suppression of outbreaks. Take Valtrex 1000 mg daily X 1 week for outbreaks.   Vitamin D (Ergocalciferol) 1.25 MG (50000 UNIT) Caps capsule Commonly known as: DRISDOL Take 1 capsule (50,000 Units total) by mouth every 7 (seven) days.        The results of significant diagnostics from this hospitalization (including imaging, microbiology, ancillary and  laboratory) are listed below for reference.    Significant Diagnostic Studies: No results found.  Microbiology: Recent Results (from the past 240 hour(s))  SARS CORONAVIRUS 2 (TAT 6-24 HRS) Nasopharyngeal Nasopharyngeal Swab     Status: None   Collection Time: 09/05/20  8:26 PM   Specimen: Nasopharyngeal Swab  Result Value Ref Range Status   SARS Coronavirus 2 NEGATIVE NEGATIVE Final    Comment: (NOTE) SARS-CoV-2 target nucleic acids are NOT DETECTED.  The SARS-CoV-2 RNA is generally detectable in upper and lower respiratory specimens during the acute phase of infection. Negative results do not preclude SARS-CoV-2 infection, do not rule out co-infections with other pathogens, and should not be used as the sole basis for treatment or other patient management decisions. Negative results must be combined with clinical observations, patient history, and epidemiological information. The expected result is Negative.  Fact Sheet for Patients: HairSlick.no  Fact Sheet for Healthcare Providers: quierodirigir.com  This test is  not yet approved or cleared by the [place] FDA and  has been authorized for detection and/or diagnosis of SARS-CoV-2 by FDA under an Emergency Use Authorization (EUA). This EUA will remain  in effect (meaning this test can be used) for the duration of the COVID-19 declaration under Se ction 564(b)(1) of the Act, 21 U.S.C. section 360bbb-3(b)(1), unless the authorization is terminated or revoked sooner.  Performed at [facility], [address] [place], [place] [address]      Labs: Basic Metabolic Panel: Recent Labs  Lab 09/05/20 1113 09/09/20 1120  NA 142 139  K 4.2 4.2  CL 105 105  CO2 27 29  GLUCOSE 102* 129*  BUN 7 9  CREATININE 0.47 0.57  CALCIUM 9.5 8.8*   Liver Function Tests: Recent Labs  Lab 09/05/20 1113  AST 17  ALT 27  ALKPHOS 84  BILITOT 0.7  PROT 7.5  ALBUMIN 3.9   No results for input(s): LIPASE, AMYLASE in the last 168 hours. No results for input(s): AMMONIA in the last 168 hours. CBC: Recent Labs  Lab 09/05/20 1113 09/09/20 1120  WBC 11.9* 10.9*  NEUTROABS 9.1* 6.0  HGB 12.3 9.6*  HCT 35.9* 28.8*  MCV 67.7* 69.4*  PLT 323 200   Cardiac Enzymes: No results for input(s): CKTOTAL, CKMB, CKMBINDEX, TROPONINI in the last 168 hours. BNP: Invalid input(s): POCBNP CBG: No results for input(s): GLUCAP in the last 168 hours.  Time coordinating discharge: 50 minutes  Signed:  Aariana Shankland  [facility] 09/10/2020, 11:14 AM

## 2020-09-11 ENCOUNTER — Telehealth: Payer: Self-pay

## 2020-09-11 ENCOUNTER — Telehealth (HOSPITAL_COMMUNITY): Payer: Self-pay | Admitting: Family Medicine

## 2020-09-11 NOTE — Telephone Encounter (Signed)
Transition Care Management Unsuccessful Follow-up Telephone Call  Date of discharge and from where:  09/10/2020-[place]  Attempts:  1st Attempt  Reason for unsuccessful TCM follow-up call:  Left voice message

## 2020-09-11 NOTE — Telephone Encounter (Signed)
Pt requesting refill for   Oxycodone HCl 20 MG TABS [[id number]]   For Dr. Hyman Hopes   Pharmacy  [phone] - [place], [place] - [address]  [address] [place] [address]  Phone:  [phone]  Fax:  [phone]

## 2020-09-12 ENCOUNTER — Other Ambulatory Visit: Payer: Self-pay

## 2020-09-12 DIAGNOSIS — G894 Chronic pain syndrome: Secondary | ICD-10-CM

## 2020-09-12 DIAGNOSIS — F1190 Opioid use, unspecified, uncomplicated: Secondary | ICD-10-CM

## 2020-09-12 DIAGNOSIS — D571 Sickle-cell disease without crisis: Secondary | ICD-10-CM

## 2020-09-12 MED ORDER — OXYCODONE HCL 20 MG PO TABS: 20.0000 mg | ORAL_TABLET | ORAL | 0 refills | Status: DC | PRN

## 2020-09-12 NOTE — Telephone Encounter (Signed)
Transition Care Management Unsuccessful Follow-up Telephone Call  Date of discharge and from where:  09/10/2020 from [place]  Attempts:  2nd Attempt  Reason for unsuccessful TCM follow-up call:  Left voice message    

## 2020-09-13 NOTE — Telephone Encounter (Signed)
Transition Care Management Unsuccessful Follow-up Telephone Call  Date of discharge and from where:  09/10/2020-[place]   Attempts:  3rd Attempt  Reason for unsuccessful TCM follow-up call:  Left voice message    

## 2020-09-18 ENCOUNTER — Other Ambulatory Visit: Payer: Self-pay | Admitting: Family Medicine

## 2020-09-18 ENCOUNTER — Telehealth: Payer: Self-pay

## 2020-09-18 DIAGNOSIS — D571 Sickle-cell disease without crisis: Secondary | ICD-10-CM

## 2020-09-18 DIAGNOSIS — F1190 Opioid use, unspecified, uncomplicated: Secondary | ICD-10-CM

## 2020-09-18 DIAGNOSIS — G894 Chronic pain syndrome: Secondary | ICD-10-CM

## 2020-09-18 MED ORDER — XTAMPZA ER 18 MG PO C12A: 18.0000 mg | EXTENDED_RELEASE_CAPSULE | Freq: Two times a day (BID) | ORAL | 0 refills | Status: DC

## 2020-09-18 NOTE — Telephone Encounter (Signed)
Xtampza   [facility] [address] [employer]

## 2020-09-18 NOTE — Progress Notes (Signed)
Reviewed PDMP substance reporting system prior to prescribing opiate medications. No inconsistencies noted.  °Meds ordered this encounter  °Medications  ° oxyCODONE ER (XTAMPZA ER) 18 MG C12A  °  Sig: Take 18 mg by mouth every 12 (twelve) hours.  °  Dispense:  60 capsule  °  Refill:  0  °  Order Specific Question:   Supervising Provider  °  Answer:   JEGEDE, OLUGBEMIGA E [[id number]]  ° Stephanie Woodham Moore Noble Cicalese  APRN, MSN, FNP-C °[facility] °[facility] °[address]  °[place], [place] [address] [phone] ° °

## 2020-09-22 ENCOUNTER — Other Ambulatory Visit: Payer: Self-pay

## 2020-09-22 ENCOUNTER — Emergency Department (HOSPITAL_COMMUNITY)
Admission: EM | Admit: 2020-09-22 | Discharge: 2020-09-22 | Disposition: A | Discharge status: Home / Self Care | Payer: Medicare Other | Attending: Emergency Medicine | Admitting: Emergency Medicine

## 2020-09-22 ENCOUNTER — Encounter (HOSPITAL_COMMUNITY): Payer: Self-pay

## 2020-09-22 DIAGNOSIS — Z20822 Contact with and (suspected) exposure to covid-19: Secondary | ICD-10-CM | POA: Insufficient documentation

## 2020-09-22 DIAGNOSIS — Z8616 Personal history of COVID-19: Secondary | ICD-10-CM | POA: Insufficient documentation

## 2020-09-22 DIAGNOSIS — N9489 Other specified conditions associated with female genital organs and menstrual cycle: Secondary | ICD-10-CM | POA: Diagnosis not present

## 2020-09-22 DIAGNOSIS — D5700 Hb-SS disease with crisis, unspecified: Secondary | ICD-10-CM

## 2020-09-22 DIAGNOSIS — D57219 Sickle-cell/Hb-C disease with crisis, unspecified: Secondary | ICD-10-CM | POA: Insufficient documentation

## 2020-09-22 LAB — CBC WITH DIFFERENTIAL/PLATELET
Abs Immature Granulocytes: 0.03 10*3/uL (ref 0.00–0.07)
Basophils Absolute: 0.1 10*3/uL (ref 0.0–0.1)
Basophils Relative: 1 %
Eosinophils Absolute: 0.2 10*3/uL (ref 0.0–0.5)
Eosinophils Relative: 2 %
HCT: 37.4 % (ref 36.0–46.0)
Hemoglobin: 12.7 g/dL (ref 12.0–15.0)
Immature Granulocytes: 0 %
Lymphocytes Relative: 27 %
Lymphs Abs: 2.8 10*3/uL (ref 0.7–4.0)
MCH: 23.3 pg — ABNORMAL LOW (ref 26.0–34.0)
MCHC: 34 g/dL (ref 30.0–36.0)
MCV: 68.6 fL — ABNORMAL LOW (ref 80.0–100.0)
Monocytes Absolute: 0.6 10*3/uL (ref 0.1–1.0)
Monocytes Relative: 6 %
Neutro Abs: 6.6 10*3/uL (ref 1.7–7.7)
Neutrophils Relative %: 64 %
Platelets: 334 10*3/uL (ref 150–400)
RBC: 5.45 MIL/uL — ABNORMAL HIGH (ref 3.87–5.11)
RDW: 16.5 % — ABNORMAL HIGH (ref 11.5–15.5)
WBC: 10.2 10*3/uL (ref 4.0–10.5)
nRBC: 0.2 % (ref 0.0–0.2)

## 2020-09-22 LAB — COMPREHENSIVE METABOLIC PANEL
ALT: 14 U/L (ref 0–44)
AST: 21 U/L (ref 15–41)
Albumin: 4.1 g/dL (ref 3.5–5.0)
Alkaline Phosphatase: 79 U/L (ref 38–126)
Anion gap: 8 (ref 5–15)
BUN: 14 mg/dL (ref 6–20)
CO2: 23 mmol/L (ref 22–32)
Calcium: 9.9 mg/dL (ref 8.9–10.3)
Chloride: 109 mmol/L (ref 98–111)
Creatinine, Ser: 0.7 mg/dL (ref 0.44–1.00)
GFR, Estimated: >60 mL/min (ref >60)
Glucose, Bld: 119 mg/dL — ABNORMAL HIGH (ref 70–99)
Potassium: 4.9 mmol/L (ref 3.5–5.1)
Sodium: 140 mmol/L (ref 135–145)
Total Bilirubin: 0.9 mg/dL (ref 0.3–1.2)
Total Protein: 8.2 g/dL — ABNORMAL HIGH (ref 6.5–8.1)

## 2020-09-22 LAB — RESP PANEL BY RT-PCR (FLU A&B, COVID) ARPGX2
Influenza A by PCR: NEGATIVE
Influenza B by PCR: NEGATIVE
SARS Coronavirus 2 by RT PCR: NEGATIVE

## 2020-09-22 LAB — I-STAT BETA HCG BLOOD, ED (MC, WL, AP ONLY): I-stat hCG, quantitative: <5 m[IU]/mL (ref <5)

## 2020-09-22 LAB — RETICULOCYTES
Immature Retic Fract: 23 % — ABNORMAL HIGH (ref 2.3–15.9)
RBC.: 5.47 MIL/uL — ABNORMAL HIGH (ref 3.87–5.11)
Retic Count, Absolute: 161.4 10*3/uL (ref 19.0–186.0)
Retic Ct Pct: 3 % (ref 0.4–3.1)

## 2020-09-22 MED ORDER — KETOROLAC TROMETHAMINE 15 MG/ML IJ SOLN
15.0000 mg | INTRAMUSCULAR | Status: DC
Start: 2020-09-22 — End: 2020-09-22

## 2020-09-22 MED ORDER — KETAMINE HCL 50 MG/5ML IJ SOSY
0.3000 mg/kg | PREFILLED_SYRINGE | Freq: Once | INTRAMUSCULAR | Status: AC
  Administered 2020-09-22: 43 mg via INTRAVENOUS
  Filled 2020-09-22: qty 5

## 2020-09-22 MED ORDER — HYDROMORPHONE HCL 2 MG/ML IJ SOLN
2.0000 mg | INTRAMUSCULAR | Status: AC
  Administered 2020-09-22: 2 mg via INTRAVENOUS
  Filled 2020-09-22: qty 1

## 2020-09-22 MED ORDER — DIPHENHYDRAMINE HCL 50 MG/ML IJ SOLN
25.0000 mg | Freq: Once | INTRAMUSCULAR | Status: AC
  Administered 2020-09-22: 25 mg via INTRAVENOUS
  Filled 2020-09-22: qty 1

## 2020-09-22 MED ORDER — HYDROMORPHONE HCL 2 MG/ML IJ SOLN
2.0000 mg | Freq: Once | INTRAMUSCULAR | Status: AC
  Administered 2020-09-22: 2 mg via INTRAVENOUS
  Filled 2020-09-22: qty 1

## 2020-09-22 MED ORDER — ONDANSETRON HCL 4 MG/2ML IJ SOLN
4.0000 mg | INTRAMUSCULAR | Status: DC | PRN
  Administered 2020-09-22: 4 mg via INTRAVENOUS
  Filled 2020-09-22: qty 2

## 2020-09-22 MED ORDER — KETOROLAC TROMETHAMINE 15 MG/ML IJ SOLN
15.0000 mg | INTRAMUSCULAR | Status: AC
  Administered 2020-09-22: 15 mg via INTRAVENOUS
  Filled 2020-09-22: qty 1

## 2020-09-22 NOTE — ED Provider Notes (Signed)
Emergency Medicine Provider Triage Evaluation Note  Stephanie Burch , a 40 y.o. female  was evaluated in triage.  Pt complains of pain in right leg. She has known history of sickle cell. Pain started last night. Denies chest pain, shortness of breath, leg swelling.  Review of Systems  Positive: Leg pain Negative: Chest pain, dyspnea, loss in sensation  Physical Exam  BP (!) 138/96 (BP Location: Left Arm)   Pulse (!) 115   Temp 98.8 F (37.1 C) (Oral)   Resp 20   SpO2 94%  Gen:   Awake, in acute distress due to pain Resp:  Normal effort. CTA. CV:   S1, S2. Regular rate, rhythm. Distal pulses intact.  MSK:   Moves extremities without difficulty. No swelling of extremities.    Medical Decision Making  Medically screening exam initiated at 8:03 AM.  Appropriate orders placed.  Renelda Ngo was informed that the remainder of the evaluation will be completed by another provider, this initial triage assessment does not replace that evaluation, and the importance of remaining in the ED until their evaluation is complete.   Claudie Leach, PA-C 09/22/20 1331    Ernie Avena, MD 09/23/20 1301

## 2020-09-22 NOTE — ED Provider Notes (Signed)
[facility]-EMERGENCY DEPT Provider Note   CSN: [id number] Arrival date & time: 09/22/20  0714     History Chief Complaint  Patient presents with   Sickle Cell Pain Crisis    Stephanie Burch is a 40 y.o. female past medical story significant for sickle cell who complains of pain consistent with her prior sickle cell crises.  She has pain to her bilateral legs, right greater than left as well as left arm.  No chest pain, shortness of breath, fever, chills, nausea, vomiting, abdominal pain.  No paresthesias or weakness.  States this is where she typically gets her sickle cell pain.  She is taking her home medications without relief.  Rates her current pain a 10/10.  Denies additional aggravating or relieving factors  Patient obtained from patient and past medical records.  No interpreter used  HPI     Past Medical History:  Diagnosis Date   HSV infection    Nausea    Sickle cell anemia (HCC)    Vitamin B12 deficiency 12/2018   Vitamin D deficiency     Patient Active Problem List   Diagnosis Date Noted   Sickle cell anemia with crisis (HCC) 06/12/2020   Pulmonary edema 11/11/2019   Hypoxia    Poor venous access 11/09/2019   HCAP (healthcare-associated pneumonia) 11/07/2019   Sepsis (HCC) 11/07/2019   Acute kidney injury (HCC) 11/07/2019   History of COVID-19 11/07/2019   COVID-19 virus detected 10/22/2019   Sore throat due to virus 10/22/2019   Urinary frequency 04/20/2019   Vaginal odor 04/20/2019   Chest pain varying with breathing    Fever and chills    Vitamin D deficiency 11/15/2018   Nausea 11/15/2018   Abnormal pulse oximetry    Hypokalemia 09/20/2018   Medication management 09/14/2018   Muscle spasms of both lower extremities 08/25/2018   Subjective visual disturbance of right eye 08/25/2018   Hb-SS disease without crisis (HCC) 04/13/2018   Chronic pain syndrome 03/16/2018   Chronic, continuous use of opioids 03/16/2018   Tachycardia  with heart rate 100-120 beats per minute    Sickle cell crisis (HCC) 10/19/2017   Leukocytosis 10/19/2017   Thrombocytopenia (HCC) 10/19/2017   Morbid obesity with BMI of 50.0-59.9, adult (HCC) 10/19/2017   Sickle cell pain crisis (HCC) 10/15/2017    History reviewed. No pertinent surgical history.   OB History     Gravida  3   Para      Term      Preterm      AB      Living  3      SAB      IAB      Ectopic      Multiple      Live Births              Family History  Problem Relation Age of Onset   Hypertension Mother    Sickle cell trait Mother    Diabetes Father    Sickle cell trait Father     Social History   Tobacco Use   Smoking status: Never   Smokeless tobacco: Never  Vaping Use   Vaping Use: Never used  Substance Use Topics   Alcohol use: Not Currently   Drug use: Never    Home Medications Prior to Admission medications   Medication Sig Start Date End Date Taking? Authorizing Provider  crizanlizumab-tmca (ADAKVEO) 100 MG/10ML SOLN Inject 680 mg into the vein every 30 (thirty) days.  07/20/20  Yes Massie Maroon, FNP  ibuprofen (ADVIL) 800 MG tablet Take 1 tablet (800 mg total) by mouth every 8 (eight) hours as needed for headache, mild pain, moderate pain or cramping. 08/09/20  Yes Massie Maroon, FNP  ondansetron (ZOFRAN) 4 MG tablet Take 1 tablet (4 mg total) by mouth every 8 (eight) hours as needed for nausea or vomiting. 09/10/20  Yes Quentin Angst, MD  oxyCODONE ER (XTAMPZA ER) 18 MG C12A Take 18 mg by mouth every 12 (twelve) hours. 09/21/20  Yes Massie Maroon, FNP  Oxycodone HCl 20 MG TABS Take 1 tablet (20 mg total) by mouth every 4 (four) hours as needed (pain). 09/12/20  Yes Quentin Angst, MD  valACYclovir (VALTREX) 500 MG tablet Take Valtrex 500 mg daily for suppression of outbreaks. Take Valtrex 1000 mg daily X 1 week for outbreaks. Patient taking differently: Take 500 mg by mouth daily. 10/20/19  Yes Kallie Locks, FNP  acetaminophen (TYLENOL) 500 MG tablet Take 2 tablets (1,000 mg total) by mouth every 6 (six) hours as needed. Patient not taking: No sig reported 08/21/20   Massie Maroon, FNP  folic acid (FOLVITE) 1 MG tablet Take 1 tablet (1 mg total) by mouth daily. 11/15/18   Kallie Locks, FNP  gabapentin (NEURONTIN) 300 MG capsule Take 300 mg by mouth 3 (three) times daily.    [provider]  Vitamin D, Ergocalciferol, (DRISDOL) 1.25 MG (50000 UT) CAPS capsule Take 1 capsule (50,000 Units total) by mouth every 7 (seven) days. Patient not taking: Reported on 09/05/2020 12/21/18   Kallie Locks, FNP    Allergies    Patient has no known allergies.  Review of Systems   Review of Systems  Constitutional: Negative.   HENT: Negative.    Respiratory: Negative.    Cardiovascular: Negative.   Gastrointestinal: Negative.   Genitourinary: Negative.   Musculoskeletal:        Bilateral leg pain, left arm pain  Skin: Negative.   Neurological: Negative.  Negative for facial asymmetry.  All other systems reviewed and are negative.  Physical Exam Updated Vital Signs BP (!) 127/93   Pulse 96   Temp 98.8 F (37.1 C) (Oral)   Resp 17   Wt (!) 143.2 kg   SpO2 98%   BMI 52.54 kg/m   Physical Exam Vitals and nursing note reviewed.  Constitutional:      General: She is not in acute distress.    Appearance: She is well-developed. She is obese. She is not ill-appearing, toxic-appearing or diaphoretic.  HENT:     Head: Normocephalic and atraumatic.     Mouth/Throat:     Mouth: Mucous membranes are moist.  Eyes:     Pupils: Pupils are equal, round, and reactive to light.  Cardiovascular:     Rate and Rhythm: Normal rate.     Pulses: Normal pulses.     Heart sounds: Normal heart sounds.  Pulmonary:     Effort: Pulmonary effort is normal. No respiratory distress.     Breath sounds: Normal breath sounds.  Abdominal:     General: Bowel sounds are normal. There is no  distension.     Palpations: Abdomen is soft.  Musculoskeletal:        General: No swelling, tenderness, deformity or signs of injury. Normal range of motion.     Cervical back: Normal range of motion.     Right lower leg: No edema.  Left lower leg: No edema.     Comments: Moves all 4 extremities without difficulty.  Compartments soft.  No bony tenderness.  Skin:    General: Skin is warm and dry.     Capillary Refill: Capillary refill takes less than 2 seconds.     Comments: No edema, erythema or warmth  Neurological:     General: No focal deficit present.     Mental Status: She is alert.  Psychiatric:        Mood and Affect: Mood normal.    ED Results / Procedures / Treatments   Labs (all labs ordered are listed, but only abnormal results are displayed) Labs Reviewed  CBC WITH DIFFERENTIAL/PLATELET - Abnormal; Notable for the following components:      Result Value   RBC 5.45 (*)    MCV 68.6 (*)    MCH 23.3 (*)    RDW 16.5 (*)    All other components within normal limits  COMPREHENSIVE METABOLIC PANEL - Abnormal; Notable for the following components:   Glucose, Bld 119 (*)    Total Protein 8.2 (*)    All other components within normal limits  RETICULOCYTES - Abnormal; Notable for the following components:   RBC. 5.47 (*)    Immature Retic Fract 23.0 (*)    All other components within normal limits  RESP PANEL BY RT-PCR (FLU A&B, COVID) [id number]  I-STAT BETA HCG BLOOD, ED ([facility], [facility], [facility] ONLY)    EKG None  Radiology No results found.  Procedures .Critical Care  Date/Time: 09/22/2020 2:54 PM Performed by: Linwood Dibbles, PA-C Authorized by: Linwood Dibbles, PA-C   Critical care provider statement:    Critical care time (minutes):  35   Critical care was necessary to treat or prevent imminent or life-threatening deterioration of the following conditions:  Circulatory failure   Critical care was time spent personally by me on the following activities:   Discussions with consultants, evaluation of patient's response to treatment, examination of patient, ordering and performing treatments and interventions, ordering and review of laboratory studies, ordering and review of radiographic studies, pulse oximetry, re-evaluation of patient's condition, obtaining history from patient or surrogate and review of old charts   Medications Ordered in ED Medications  ondansetron (ZOFRAN) injection 4 mg (4 mg Intravenous Given 09/22/20 0916)  HYDROmorphone (DILAUDID) injection 2 mg (2 mg Intravenous Given 09/22/20 0916)  HYDROmorphone (DILAUDID) injection 2 mg (2 mg Intravenous Given 09/22/20 1135)  ketamine 50 mg in normal saline 5 mL (10 mg/mL) syringe (43 mg Intravenous Given 09/22/20 1013)  diphenhydrAMINE (BENADRYL) injection 25 mg (25 mg Intravenous Given 09/22/20 0916)  ketorolac (TORADOL) 15 MG/ML injection 15 mg (15 mg Intravenous Given 09/22/20 0916)  HYDROmorphone (DILAUDID) injection 2 mg (2 mg Intravenous Given 09/22/20 1254)  HYDROmorphone (DILAUDID) injection 2 mg (2 mg Intravenous Given 09/22/20 1447)   ED Course  I have reviewed the triage vital signs and the nursing notes.  Pertinent labs & imaging results that were available during my care of the patient were reviewed by me and considered in my medical decision making (see chart for details).  Here for evaluation of sickle cell crises.  Pain to bilateral legs, left arm.  Is consistent with prior sickle cell crises.  She is afebrile, nonseptic, not ill-appearing.  No chest pain, shortness of breath.  Heart and lungs clear.  Abdomen soft, nontender.  Sickle cell work-up started from triage  Labs personally reviewed and interpreted:  CBC without leukocytosis Metabolic  panel glucose 119 Preg negative  Patient reassessed.  Pain uncontrolled  Pain reassessed.  Pain uncontrolled.  Like to try additional dose of Dilaudid to see if she can go home.  Pain uncontrolled. Will consult with admitting  team for admission.  CONSULT with Dr. Hyman Hopes with sickle cell team.  Patient's labs are at her baseline she does not need admission.  He recommends discharge her home with her home medications, follow-up in sickle cell clinic on Monday.  Patient is tearful in the room when discussing outpatient follow-up.  She is requesting additional dose of Dilaudid "to get me through the night."  Has close outpatient follow-up.  Patient agreeable  Patient's likely acute on chronic pain.  Low suspicion for sepsis, VTE, ischemic limb occult fracture, dislocation, acute chest syndrome.  The patient has been appropriately medically screened and/or stabilized in the ED. I have low suspicion for any other emergent medical condition which would require further screening, evaluation or treatment in the ED or require inpatient management.  Patient is hemodynamically stable and in no acute distress.  Patient able to ambulate in department prior to ED.  Evaluation does not show acute pathology that would require ongoing or additional emergent interventions while in the emergency department or further inpatient treatment.  I have discussed the diagnosis with the patient and answered all questions.  Pain is been managed while in the emergency department and patient has no further complaints prior to discharge.  Patient is comfortable with plan discussed in room and is stable for discharge at this time.  I have discussed strict return precautions for returning to the emergency department.  Patient was encouraged to follow-up with PCP/specialist refer to at discharge.       MDM Rules/Calculators/A&P                            Final Clinical Impression(s) / ED Diagnoses Final diagnoses:  Sickle cell pain crisis [facility])    Rx / DC Orders ED Discharge Orders     None        Gilford Lardizabal A, PA-C 09/22/20 1455    Ernie Avena, MD 09/23/20 1006

## 2020-09-22 NOTE — ED Triage Notes (Signed)
Pt arrived via POV, SCC, pain in bilateral legs and left arm.

## 2020-09-22 NOTE — Discharge Instructions (Signed)
Take your home pain medicine  Follow-up with sickle cell day clinic on Monday  Return for new or worsening symptoms.

## 2020-09-24 ENCOUNTER — Telehealth (HOSPITAL_COMMUNITY): Payer: Self-pay

## 2020-09-24 ENCOUNTER — Emergency Department (HOSPITAL_COMMUNITY): Admission: EM | Admit: 2020-09-24 | Payer: Medicare Other | Source: Home / Self Care

## 2020-09-24 ENCOUNTER — Other Ambulatory Visit: Payer: Self-pay | Admitting: Internal Medicine

## 2020-09-24 ENCOUNTER — Non-Acute Institutional Stay (HOSPITAL_COMMUNITY)
Admission: AD | Admit: 2020-09-24 | Discharge: 2020-09-24 | Disposition: A | Discharge status: Home / Self Care | Payer: Medicare Other | Source: Ambulatory Visit | Attending: Internal Medicine | Admitting: Internal Medicine

## 2020-09-24 ENCOUNTER — Other Ambulatory Visit: Payer: Self-pay

## 2020-09-24 DIAGNOSIS — Z79891 Long term (current) use of opiate analgesic: Secondary | ICD-10-CM | POA: Diagnosis not present

## 2020-09-24 DIAGNOSIS — G894 Chronic pain syndrome: Secondary | ICD-10-CM | POA: Diagnosis not present

## 2020-09-24 DIAGNOSIS — D5700 Hb-SS disease with crisis, unspecified: Secondary | ICD-10-CM | POA: Diagnosis present

## 2020-09-24 DIAGNOSIS — Z79899 Other long term (current) drug therapy: Secondary | ICD-10-CM | POA: Insufficient documentation

## 2020-09-24 DIAGNOSIS — Z6841 Body Mass Index (BMI) 40.0 and over, adult: Secondary | ICD-10-CM | POA: Diagnosis not present

## 2020-09-24 DIAGNOSIS — F1190 Opioid use, unspecified, uncomplicated: Secondary | ICD-10-CM | POA: Diagnosis present

## 2020-09-24 DIAGNOSIS — D638 Anemia in other chronic diseases classified elsewhere: Secondary | ICD-10-CM | POA: Insufficient documentation

## 2020-09-24 LAB — CBC WITH DIFFERENTIAL/PLATELET
Abs Immature Granulocytes: 0.02 10*3/uL (ref 0.00–0.07)
Basophils Absolute: 0 10*3/uL (ref 0.0–0.1)
Basophils Relative: 0 %
Eosinophils Absolute: 0.2 10*3/uL (ref 0.0–0.5)
Eosinophils Relative: 2 %
HCT: 35.6 % — ABNORMAL LOW (ref 36.0–46.0)
Hemoglobin: 12.1 g/dL (ref 12.0–15.0)
Immature Granulocytes: 0 %
Lymphocytes Relative: 26 %
Lymphs Abs: 2.3 10*3/uL (ref 0.7–4.0)
MCH: 23.2 pg — ABNORMAL LOW (ref 26.0–34.0)
MCHC: 34 g/dL (ref 30.0–36.0)
MCV: 68.2 fL — ABNORMAL LOW (ref 80.0–100.0)
Monocytes Absolute: 0.7 10*3/uL (ref 0.1–1.0)
Monocytes Relative: 8 %
Neutro Abs: 5.7 10*3/uL (ref 1.7–7.7)
Neutrophils Relative %: 64 %
Platelets: 331 10*3/uL (ref 150–400)
RBC: 5.22 MIL/uL — ABNORMAL HIGH (ref 3.87–5.11)
RDW: 16.1 % — ABNORMAL HIGH (ref 11.5–15.5)
WBC: 8.9 10*3/uL (ref 4.0–10.5)
nRBC: 0 % (ref 0.0–0.2)

## 2020-09-24 LAB — COMPREHENSIVE METABOLIC PANEL
ALT: 13 U/L (ref 0–44)
AST: 11 U/L — ABNORMAL LOW (ref 15–41)
Albumin: 3.9 g/dL (ref 3.5–5.0)
Alkaline Phosphatase: 81 U/L (ref 38–126)
Anion gap: 10 (ref 5–15)
BUN: 10 mg/dL (ref 6–20)
CO2: 24 mmol/L (ref 22–32)
Calcium: 9.3 mg/dL (ref 8.9–10.3)
Chloride: 107 mmol/L (ref 98–111)
Creatinine, Ser: 0.6 mg/dL (ref 0.44–1.00)
GFR, Estimated: >60 mL/min (ref >60)
Glucose, Bld: 117 mg/dL — ABNORMAL HIGH (ref 70–99)
Potassium: 3.8 mmol/L (ref 3.5–5.1)
Sodium: 141 mmol/L (ref 135–145)
Total Bilirubin: 0.7 mg/dL (ref 0.3–1.2)
Total Protein: 8 g/dL (ref 6.5–8.1)

## 2020-09-24 LAB — LACTATE DEHYDROGENASE: LDH: 124 U/L (ref 98–192)

## 2020-09-24 MED ORDER — SODIUM CHLORIDE 0.9 % IV SOLN
25.0000 mg | INTRAVENOUS | Status: DC | PRN
  Filled 2020-09-24: qty 0.5

## 2020-09-24 MED ORDER — SODIUM CHLORIDE 0.9% FLUSH: 9.0000 mL | INTRAVENOUS | Status: DC | PRN

## 2020-09-24 MED ORDER — POLYETHYLENE GLYCOL 3350 17 G PO PACK: 17.0000 g | PACK | Freq: Every day | ORAL | Status: DC | PRN

## 2020-09-24 MED ORDER — HYDROMORPHONE 1 MG/ML IV SOLN
INTRAVENOUS | Status: DC
Start: 2020-09-24 — End: 2020-09-24
  Administered 2020-09-24: 12.5 mg via INTRAVENOUS
  Administered 2020-09-24: 30 mg via INTRAVENOUS
  Filled 2020-09-24: qty 30

## 2020-09-24 MED ORDER — KETOROLAC TROMETHAMINE 30 MG/ML IJ SOLN
30.0000 mg | Freq: Four times a day (QID) | INTRAMUSCULAR | Status: DC
  Administered 2020-09-24: 30 mg via INTRAVENOUS
  Filled 2020-09-24: qty 1

## 2020-09-24 MED ORDER — NALOXONE HCL 0.4 MG/ML IJ SOLN: 0.4000 mg | INTRAMUSCULAR | Status: DC | PRN

## 2020-09-24 MED ORDER — DIPHENHYDRAMINE HCL 25 MG PO CAPS
25.0000 mg | ORAL_CAPSULE | ORAL | Status: DC | PRN
  Administered 2020-09-24: 25 mg via ORAL
  Filled 2020-09-24: qty 1

## 2020-09-24 MED ORDER — ONDANSETRON HCL 4 MG/2ML IJ SOLN
4.0000 mg | Freq: Four times a day (QID) | INTRAMUSCULAR | Status: DC | PRN
  Administered 2020-09-24: 4 mg via INTRAVENOUS
  Filled 2020-09-24: qty 2

## 2020-09-24 MED ORDER — DEXTROSE-NACL 5-0.45 % IV SOLN: INTRAVENOUS | Status: DC

## 2020-09-24 MED ORDER — OXYCODONE HCL 5 MG PO TABS
20.0000 mg | ORAL_TABLET | Freq: Once | ORAL | Status: AC
  Administered 2020-09-24: 20 mg via ORAL
  Filled 2020-09-24: qty 4

## 2020-09-24 MED ORDER — SENNOSIDES-DOCUSATE SODIUM 8.6-50 MG PO TABS: 1.0000 | ORAL_TABLET | Freq: Two times a day (BID) | ORAL | Status: DC

## 2020-09-24 NOTE — Telephone Encounter (Signed)
Patient called in. Complains of pain in legs and R arm rates 8/10. Denied chest pain, abd pain, fever, N/V/D. Wants to come in for treatment. Last took Oxycodone 20mg  and Xtampza at 1:00am.  Pt states she was in the ED Saturday and was told to f/u at the day hospital today. Pt states she uses Saturday. Pt denies exposure to anyone covid positive in last two weeks, denies flu like symptoms today. Dr. [employer] notified, approved pt to be seen in day hospital. Pt made aware, verbalized understanding.

## 2020-09-24 NOTE — H&P (Signed)
Stephanie Burch is an 40 y.o. female.   Chief Complaint: Pain in arms and legs  HPI: Patient is a 40 year old female well-known to our service with history of sickle cell disease who came to the sickle cell day hospital with complaint of significant pain in her arms especially legs.  Patient has been taking her home regimen with no relief.  She came to the sickle cell day hospital and is being admitted for pain management.  No nausea vomiting or diarrhea no fever.  Past Medical History:  Diagnosis Date   HSV infection    Nausea    Sickle cell anemia (HCC)    Vitamin B12 deficiency 12/2018   Vitamin D deficiency     No past surgical history on file.  Family History  Problem Relation Age of Onset   Hypertension Mother    Sickle cell trait Mother    Diabetes Father    Sickle cell trait Father    Social History:  reports that she has never smoked. She has never used smokeless tobacco. She reports that she does not currently use alcohol. She reports that she does not use drugs.  Allergies: No Known Allergies  Medications Prior to Admission  Medication Sig Dispense Refill   acetaminophen (TYLENOL) 500 MG tablet Take 2 tablets (1,000 mg total) by mouth every 6 (six) hours as needed. (Patient not taking: No sig reported) 30 tablet 2   crizanlizumab-tmca (ADAKVEO) 100 MG/10ML SOLN Inject 680 mg into the vein every 30 (thirty) days. 68 mL 0   folic acid (FOLVITE) 1 MG tablet Take 1 tablet (1 mg total) by mouth daily. 30 tablet 11   gabapentin (NEURONTIN) 300 MG capsule Take 300 mg by mouth 3 (three) times daily.     ibuprofen (ADVIL) 800 MG tablet Take 1 tablet (800 mg total) by mouth every 8 (eight) hours as needed for headache, mild pain, moderate pain or cramping. 30 tablet 1   ondansetron (ZOFRAN) 4 MG tablet Take 1 tablet (4 mg total) by mouth every 8 (eight) hours as needed for nausea or vomiting. 20 tablet 0   oxyCODONE ER (XTAMPZA ER) 18 MG C12A Take 18 mg by mouth every 12 (twelve)  hours. 60 capsule 0   Oxycodone HCl 20 MG TABS Take 1 tablet (20 mg total) by mouth every 4 (four) hours as needed (pain). 90 tablet 0   valACYclovir (VALTREX) 500 MG tablet Take Valtrex 500 mg daily for suppression of outbreaks. Take Valtrex 1000 mg daily X 1 week for outbreaks. (Patient taking differently: Take 500 mg by mouth daily.) 90 tablet 11   Vitamin D, Ergocalciferol, (DRISDOL) 1.25 MG (50000 UT) CAPS capsule Take 1 capsule (50,000 Units total) by mouth every 7 (seven) days. (Patient not taking: Reported on 09/05/2020) 5 capsule 6    Results for orders placed or performed during the hospital encounter of 09/22/20 (from the past 48 hour(s))  Resp Panel by RT-PCR (Flu A&B, Covid) Nasopharyngeal Swab     Status: None   Collection Time: 09/22/20  2:15 PM   Specimen: Nasopharyngeal Swab; Nasopharyngeal(NP) swabs in vial transport medium  Result Value Ref Range   SARS Coronavirus 2 by RT PCR NEGATIVE NEGATIVE    Comment: (NOTE) SARS-CoV-2 target nucleic acids are NOT DETECTED.  The SARS-CoV-2 RNA is generally detectable in upper respiratory specimens during the acute phase of infection. The lowest concentration of SARS-CoV-2 viral copies this assay can detect is 138 copies/mL. A negative result does not preclude SARS-Cov-2 infection and  should not be used as the sole basis for treatment or other patient management decisions. A negative result may occur with  improper specimen collection/handling, submission of specimen other than nasopharyngeal swab, presence of viral mutation(s) within the areas targeted by this assay, and inadequate number of viral copies(<138 copies/mL). A negative result must be combined with clinical observations, patient history, and epidemiological information. The expected result is Negative.  Fact Sheet for Patients:  BloggerCourse.com  Fact Sheet for Healthcare Providers:  SeriousBroker.it  This test is no  t yet approved or cleared by the [place] FDA and  has been authorized for detection and/or diagnosis of SARS-CoV-2 by FDA under an Emergency Use Authorization (EUA). This EUA will remain  in effect (meaning this test can be used) for the duration of the COVID-19 declaration under Section 564(b)(1) of the Act, 21 U.S.C.section 360bbb-3(b)(1), unless the authorization is terminated  or revoked sooner.       Influenza A by PCR NEGATIVE NEGATIVE   Influenza B by PCR NEGATIVE NEGATIVE    Comment: (NOTE) The Xpert Xpress SARS-CoV-2/FLU/RSV plus assay is intended as an aid in the diagnosis of influenza from Nasopharyngeal swab specimens and should not be used as a sole basis for treatment. Nasal washings and aspirates are unacceptable for Xpert Xpress SARS-CoV-2/FLU/RSV testing.  Fact Sheet for Patients: BloggerCourse.com  Fact Sheet for Healthcare Providers: SeriousBroker.it  This test is not yet approved or cleared by the [place] FDA and has been authorized for detection and/or diagnosis of SARS-CoV-2 by FDA under an Emergency Use Authorization (EUA). This EUA will remain in effect (meaning this test can be used) for the duration of the COVID-19 declaration under Section 564(b)(1) of the Act, 21 U.S.C. section 360bbb-3(b)(1), unless the authorization is terminated or revoked.  Performed at [facility], [address] [place], [place] [address]    No results found.  Review of Systems  Constitutional: Negative.   HENT: Negative.    Eyes: Negative.   Respiratory: Negative.    Cardiovascular: Negative.   Gastrointestinal: Negative.   Endocrine: Negative.   Genitourinary: Negative.   Musculoskeletal:  Positive for myalgias.  Skin: Negative.   Neurological: Negative.   Hematological: Negative.   Psychiatric/Behavioral: Negative.     There were no vitals taken for this visit. Physical  Exam Constitutional:      Appearance: She is obese. She is ill-appearing.  HENT:     Head: Normocephalic and atraumatic.     Right Ear: Tympanic membrane normal.     Left Ear: Tympanic membrane normal.     Nose: Nose normal.     Mouth/Throat:     Mouth: Mucous membranes are dry.  Eyes:     Extraocular Movements: Extraocular movements intact.     Pupils: Pupils are equal, round, and reactive to light.  Cardiovascular:     Rate and Rhythm: Normal rate and regular rhythm.     Pulses: Normal pulses.     Heart sounds: Normal heart sounds.  Pulmonary:     Effort: Pulmonary effort is normal.     Breath sounds: Normal breath sounds.  Abdominal:     General: Bowel sounds are normal.     Palpations: Abdomen is soft.  Musculoskeletal:        General: Tenderness present. Normal range of motion.     Cervical back: Normal range of motion and neck supple.  Skin:    General: Skin is warm and dry.  Neurological:  General: No focal deficit present.     Mental Status: She is alert and oriented to person, place, and time.  Psychiatric:        Mood and Affect: Mood normal.     Assessment/Plan 40 year old here with sickle cell painful crisis.  #1 sickle cell painful crisis: Patient will be admitted to the day hospital.  Initiate Dilaudid PCA, Toradol, IV fluids.  Continue long-acting and short acting pain medicines.  #2 chronic pain syndrome: Resume home regimen.  Prescriptions given for her home medications.  #3 anemia of chronic disease: Check H&H and monitor  Monico Sudduth,LAWAL, MD 09/24/2020, 10:04 AM

## 2020-09-24 NOTE — Discharge Summary (Signed)
Physician Discharge Summary  Patient ID: Stephanie Burch MRN: [id number] DOB/AGE: 1980-10-25 40 y.o.  Admit date: 09/24/2020 Discharge date: 09/24/2020  Admission Diagnoses:  Discharge Diagnoses:  Active Problems:   Sickle cell pain crisis (HCC)   Morbid obesity with BMI of 50.0-59.9, adult (HCC)   Chronic, continuous use of opioids   Sickle cell disease with crisis [facility])   Discharged Condition: good  Hospital Course: Patient is a 40 year old female who was admitted to the sickle cell day hospital with pain in her back and legs consistent with sickle cell painful crisis.  Patient was initiated on Dilaudid PCA, Toradol, IV fluids and supportive care.  She did very well.  Pain was down to 3 out of 10.  Patient subsequently discharged home on home regimen to follow-up with PCP.  She has prescriptions given.  She will continue treatment at home and advised to stay hydrated.  Consults: None  Significant Diagnostic Studies: labs: CBC and CMP checked.  No need for acute transfusion.  Treatments: IV hydration and analgesia: Dilaudid  Discharge Exam: Blood pressure (!) 100/53, pulse 88, temperature 98.1 F (36.7 C), temperature source Temporal, resp. rate 13, last menstrual period 09/14/2020, SpO2 95 %. General appearance: alert, cooperative, appears stated age, and no distress Neck: no adenopathy, no carotid bruit, no JVD, supple, symmetrical, trachea midline, and thyroid not enlarged, symmetric, no tenderness/mass/nodules Back: symmetric, no curvature. ROM normal. No CVA tenderness. Resp: clear to auscultation bilaterally Cardio: regular rate and rhythm, S1, S2 normal, no murmur, click, rub or gallop GI: soft, non-tender; bowel sounds normal; no masses,  no organomegaly Extremities: extremities normal, atraumatic, no cyanosis or edema Pulses: 2+ and symmetric Skin: Skin color, texture, turgor normal. No rashes or lesions Neurologic: Grossly normal  Disposition: Discharge disposition:  01-Home or Self Care       Discharge Instructions     Diet - low sodium heart healthy   Complete by: As directed    Increase activity slowly   Complete by: As directed       Allergies as of 09/24/2020   No Known Allergies      Medication List     STOP taking these medications    acetaminophen 500 MG tablet Commonly known as: TYLENOL       TAKE these medications    crizanlizumab-tmca 100 MG/10ML Soln Commonly known as: ADAKVEO Inject 680 mg into the vein every 30 (thirty) days.   folic acid 1 MG tablet Commonly known as: FOLVITE Take 1 tablet (1 mg total) by mouth daily.   gabapentin 300 MG capsule Commonly known as: NEURONTIN Take 300 mg by mouth 3 (three) times daily.   ibuprofen 800 MG tablet Commonly known as: ADVIL Take 1 tablet (800 mg total) by mouth every 8 (eight) hours as needed for headache, mild pain, moderate pain or cramping.   ondansetron 4 MG tablet Commonly known as: ZOFRAN Take 1 tablet (4 mg total) by mouth every 8 (eight) hours as needed for nausea or vomiting.   Oxycodone HCl 20 MG Tabs Take 1 tablet (20 mg total) by mouth every 4 (four) hours as needed (pain).   valACYclovir 500 MG tablet Commonly known as: Valtrex Take Valtrex 500 mg daily for suppression of outbreaks. Take Valtrex 1000 mg daily X 1 week for outbreaks. What changed:  how much to take how to take this when to take this additional instructions   Vitamin D (Ergocalciferol) 1.25 MG (50000 UNIT) Caps capsule Commonly known as: DRISDOL Take 1 capsule (50,000  Units total) by mouth every 7 (seven) days.   Xtampza ER 18 MG C12a Generic drug: oxyCODONE ER Take 18 mg by mouth every 12 (twelve) hours.         SignedLonia Blood 09/24/2020, 3:51 PM

## 2020-09-24 NOTE — Progress Notes (Signed)
Pt admitted to the day hospital for treatment of sickle cell pain crisis. Pt reported pain to bilateral legs and L arm rated a 9/10. Pt given PO Benadryl, IV toradol and zofran for c/o nausea, placed on Dilaudid PCA, and hydrated with IV fluids. Pt received 20mg  Oxycodone. At discharge patient reported pain a 6/10. Pt states she is out of her oxycodone, Dr. notified, refill due to be filled on 8/25, pt made aware. Pt declined AVS. Pt alert , oriented and ambulatory at discharge.

## 2020-09-26 ENCOUNTER — Other Ambulatory Visit: Payer: Self-pay | Admitting: Internal Medicine

## 2020-09-26 ENCOUNTER — Other Ambulatory Visit: Payer: Self-pay | Admitting: Family Medicine

## 2020-09-26 DIAGNOSIS — G894 Chronic pain syndrome: Secondary | ICD-10-CM

## 2020-09-26 DIAGNOSIS — M62838 Other muscle spasm: Secondary | ICD-10-CM

## 2020-09-26 DIAGNOSIS — F1190 Opioid use, unspecified, uncomplicated: Secondary | ICD-10-CM

## 2020-09-26 DIAGNOSIS — D571 Sickle-cell disease without crisis: Secondary | ICD-10-CM

## 2020-09-26 MED ORDER — OXYCODONE HCL 20 MG PO TABS: 20.0000 mg | ORAL_TABLET | ORAL | 0 refills | Status: DC | PRN

## 2020-09-28 ENCOUNTER — Encounter (HOSPITAL_COMMUNITY): Payer: Medicare Other

## 2020-10-04 ENCOUNTER — Non-Acute Institutional Stay (HOSPITAL_COMMUNITY)
Admission: RE | Admit: 2020-10-04 | Discharge: 2020-10-04 | Disposition: A | Discharge status: Home / Self Care | Payer: Medicare Other | Source: Ambulatory Visit | Attending: Internal Medicine | Admitting: Internal Medicine

## 2020-10-04 ENCOUNTER — Other Ambulatory Visit: Payer: Self-pay | Admitting: Family Medicine

## 2020-10-04 ENCOUNTER — Other Ambulatory Visit: Payer: Self-pay

## 2020-10-04 DIAGNOSIS — D571 Sickle-cell disease without crisis: Secondary | ICD-10-CM

## 2020-10-04 LAB — CBC WITH DIFFERENTIAL/PLATELET
Abs Immature Granulocytes: 0.03 10*3/uL (ref 0.00–0.07)
Basophils Absolute: 0 10*3/uL (ref 0.0–0.1)
Basophils Relative: 0 %
Eosinophils Absolute: 0.4 10*3/uL (ref 0.0–0.5)
Eosinophils Relative: 4 %
HCT: 34 % — ABNORMAL LOW (ref 36.0–46.0)
Hemoglobin: 11.4 g/dL — ABNORMAL LOW (ref 12.0–15.0)
Immature Granulocytes: 0 %
Lymphocytes Relative: 26 %
Lymphs Abs: 2.4 10*3/uL (ref 0.7–4.0)
MCH: 22.8 pg — ABNORMAL LOW (ref 26.0–34.0)
MCHC: 33.5 g/dL (ref 30.0–36.0)
MCV: 67.9 fL — ABNORMAL LOW (ref 80.0–100.0)
Monocytes Absolute: 0.7 10*3/uL (ref 0.1–1.0)
Monocytes Relative: 7 %
Neutro Abs: 5.9 10*3/uL (ref 1.7–7.7)
Neutrophils Relative %: 63 %
Platelets: 263 10*3/uL (ref 150–400)
RBC: 5.01 MIL/uL (ref 3.87–5.11)
RDW: 15.9 % — ABNORMAL HIGH (ref 11.5–15.5)
WBC: 9.4 10*3/uL (ref 4.0–10.5)
nRBC: 0.2 % (ref 0.0–0.2)

## 2020-10-04 MED ORDER — SODIUM CHLORIDE 0.9 % IV SOLN
INTRAVENOUS | Status: DC | PRN
  Administered 2020-10-04: 250 mL via INTRAVENOUS

## 2020-10-04 MED ORDER — SODIUM CHLORIDE 0.9 % IV SOLN
680.0000 mg | Freq: Once | INTRAVENOUS | Status: AC
  Administered 2020-10-04: 680 mg via INTRAVENOUS
  Filled 2020-10-04: qty 68

## 2020-10-04 MED ORDER — NON FORMULARY: 680.0000 mg | Freq: Once | Status: DC

## 2020-10-04 NOTE — Progress Notes (Signed)
PATIENT CARE CENTER NOTE     Diagnosis: Sickle Cell Anemia      Provider: Hollis, [place], FNP     Procedure: Nettie Elm infusion      Note: Patient received Adakveo infusion via PIV. Patient tolerated well with no adverse reaction. Vital signs stable. Patient to come back every 4 weeks for infusion. Patient alert, oriented and ambulatory at discharge.

## 2020-10-09 ENCOUNTER — Encounter (HOSPITAL_COMMUNITY): Payer: Self-pay

## 2020-10-09 ENCOUNTER — Other Ambulatory Visit: Payer: Self-pay | Admitting: Family Medicine

## 2020-10-09 ENCOUNTER — Other Ambulatory Visit: Payer: Self-pay

## 2020-10-09 ENCOUNTER — Emergency Department (HOSPITAL_COMMUNITY)
Admission: EM | Admit: 2020-10-09 | Discharge: 2020-10-09 | Disposition: A | Discharge status: Home / Self Care | Payer: Medicare Other | Attending: Emergency Medicine | Admitting: Emergency Medicine

## 2020-10-09 DIAGNOSIS — Z8616 Personal history of COVID-19: Secondary | ICD-10-CM | POA: Insufficient documentation

## 2020-10-09 DIAGNOSIS — G894 Chronic pain syndrome: Secondary | ICD-10-CM

## 2020-10-09 DIAGNOSIS — D571 Sickle-cell disease without crisis: Secondary | ICD-10-CM

## 2020-10-09 DIAGNOSIS — F1190 Opioid use, unspecified, uncomplicated: Secondary | ICD-10-CM

## 2020-10-09 DIAGNOSIS — D5700 Hb-SS disease with crisis, unspecified: Secondary | ICD-10-CM | POA: Insufficient documentation

## 2020-10-09 LAB — COMPREHENSIVE METABOLIC PANEL
ALT: 13 U/L (ref 0–44)
AST: 12 U/L — ABNORMAL LOW (ref 15–41)
Albumin: 3.7 g/dL (ref 3.5–5.0)
Alkaline Phosphatase: 87 U/L (ref 38–126)
Anion gap: 6 (ref 5–15)
BUN: 11 mg/dL (ref 6–20)
CO2: 22 mmol/L (ref 22–32)
Calcium: 9.4 mg/dL (ref 8.9–10.3)
Chloride: 114 mmol/L — ABNORMAL HIGH (ref 98–111)
Creatinine, Ser: 0.65 mg/dL (ref 0.44–1.00)
GFR, Estimated: >60 mL/min (ref >60)
Glucose, Bld: 110 mg/dL — ABNORMAL HIGH (ref 70–99)
Potassium: 3.9 mmol/L (ref 3.5–5.1)
Sodium: 142 mmol/L (ref 135–145)
Total Bilirubin: 0.7 mg/dL (ref 0.3–1.2)
Total Protein: 7.6 g/dL (ref 6.5–8.1)

## 2020-10-09 LAB — CBC WITH DIFFERENTIAL/PLATELET
Abs Immature Granulocytes: 0.04 10*3/uL (ref 0.00–0.07)
Basophils Absolute: 0 10*3/uL (ref 0.0–0.1)
Basophils Relative: 0 %
Eosinophils Absolute: 0.2 10*3/uL (ref 0.0–0.5)
Eosinophils Relative: 1 %
HCT: 34.7 % — ABNORMAL LOW (ref 36.0–46.0)
Hemoglobin: 12.1 g/dL (ref 12.0–15.0)
Immature Granulocytes: 0 %
Lymphocytes Relative: 18 %
Lymphs Abs: 1.9 10*3/uL (ref 0.7–4.0)
MCH: 23.4 pg — ABNORMAL LOW (ref 26.0–34.0)
MCHC: 34.9 g/dL (ref 30.0–36.0)
MCV: 67.1 fL — ABNORMAL LOW (ref 80.0–100.0)
Monocytes Absolute: 0.7 10*3/uL (ref 0.1–1.0)
Monocytes Relative: 7 %
Neutro Abs: 7.5 10*3/uL (ref 1.7–7.7)
Neutrophils Relative %: 74 %
Platelets: 250 10*3/uL (ref 150–400)
RBC: 5.17 MIL/uL — ABNORMAL HIGH (ref 3.87–5.11)
RDW: 15.9 % — ABNORMAL HIGH (ref 11.5–15.5)
WBC: 10.4 10*3/uL (ref 4.0–10.5)
nRBC: 0 % (ref 0.0–0.2)

## 2020-10-09 LAB — RETICULOCYTES
Immature Retic Fract: 31 % — ABNORMAL HIGH (ref 2.3–15.9)
RBC.: 5.19 MIL/uL — ABNORMAL HIGH (ref 3.87–5.11)
Retic Count, Absolute: 138.1 10*3/uL (ref 19.0–186.0)
Retic Ct Pct: 2.7 % (ref 0.4–3.1)

## 2020-10-09 LAB — I-STAT BETA HCG BLOOD, ED (MC, WL, AP ONLY): I-stat hCG, quantitative: <5 m[IU]/mL (ref <5)

## 2020-10-09 MED ORDER — ONDANSETRON HCL 4 MG/2ML IJ SOLN
4.0000 mg | INTRAMUSCULAR | Status: DC | PRN
  Administered 2020-10-09: 4 mg via INTRAVENOUS
  Filled 2020-10-09: qty 2

## 2020-10-09 MED ORDER — KETOROLAC TROMETHAMINE 15 MG/ML IJ SOLN
15.0000 mg | INTRAMUSCULAR | Status: AC
  Administered 2020-10-09: 15 mg via INTRAVENOUS
  Filled 2020-10-09: qty 1

## 2020-10-09 MED ORDER — DEXTROSE-NACL 5-0.45 % IV SOLN: INTRAVENOUS | Status: DC

## 2020-10-09 MED ORDER — OXYCODONE HCL 5 MG PO TABS
15.0000 mg | ORAL_TABLET | Freq: Once | ORAL | Status: AC
  Administered 2020-10-09: 15 mg via ORAL
  Filled 2020-10-09: qty 3

## 2020-10-09 MED ORDER — HYDROMORPHONE HCL 2 MG/ML IJ SOLN
2.0000 mg | Freq: Once | INTRAMUSCULAR | Status: AC
  Administered 2020-10-09: 2 mg via INTRAVENOUS
  Filled 2020-10-09: qty 1

## 2020-10-09 MED ORDER — HYDROMORPHONE HCL 2 MG/ML IJ SOLN
2.0000 mg | INTRAMUSCULAR | Status: AC
  Administered 2020-10-09: 2 mg via INTRAVENOUS
  Filled 2020-10-09: qty 1

## 2020-10-09 MED ORDER — OXYCODONE HCL 20 MG PO TABS: 20.0000 mg | ORAL_TABLET | ORAL | 0 refills | Status: DC | PRN

## 2020-10-09 MED ORDER — DIPHENHYDRAMINE HCL 25 MG PO CAPS: 25.0000 mg | ORAL_CAPSULE | ORAL | Status: DC | PRN

## 2020-10-09 NOTE — ED Notes (Signed)
Pt provided dc instructions and follow up care. Alert and ambulatory. No iv. Has a ride home

## 2020-10-09 NOTE — Progress Notes (Signed)
Reviewed PDMP substance reporting system prior to prescribing opiate medications. No inconsistencies noted.  Meds ordered this encounter  Medications   Oxycodone HCl 20 MG TABS    Sig: Take 1 tablet (20 mg total) by mouth every 4 (four) hours as needed for up to 15 days (pain).    Dispense:  90 tablet    Refill:  0    Order Specific Question:   Supervising Provider    Answer:   JEGEDE, OLUGBEMIGA E [[id number]]     Wen Merced Moore Shadrach Bartunek  APRN, MSN, FNP-C [facility] [address]  [place], [place] [address] [phone]  

## 2020-10-09 NOTE — ED Triage Notes (Signed)
Patient presents with c/o sickle cell Crisis pain to the  bilateral leg.

## 2020-10-09 NOTE — Discharge Instructions (Addendum)
As discussed, your evaluation today has been largely reassuring.  But, it is important that you monitor your condition carefully, and do not hesitate to return to the ED if you develop new, or concerning changes in your condition.  Otherwise, please follow-up with your physician for appropriate ongoing care.  Your prescription should be available tomorrow (9/7)

## 2020-10-09 NOTE — ED Provider Notes (Signed)
[facility]-EMERGENCY DEPT Provider Note   CSN: [id number] Arrival date & time: 10/09/20  6073     History No chief complaint on file.   Stephanie Burch is a 40 y.o. female.  HPI Patient with multiple medical issues including sickle cell disease presents with bilateral leg and arm pain.  She notes that she was well till yesterday.  After running out of her medication while traveling she developed pain in her lower extremities, upper extremities, bilateral, and a pattern consistent with prior pain exacerbations.  No chest pain, dyspnea, fever, chills, nausea, vomiting. Pain is severe, not improved with OTC medication.    Past Medical History:  Diagnosis Date   HSV infection    Nausea    Sickle cell anemia (HCC)    Vitamin B12 deficiency 12/2018   Vitamin D deficiency     Patient Active Problem List   Diagnosis Date Noted   Sickle cell disease with crisis (HCC) 09/24/2020   Sickle cell anemia with crisis (HCC) 06/12/2020   Pulmonary edema 11/11/2019   Hypoxia    Poor venous access 11/09/2019   HCAP (healthcare-associated pneumonia) 11/07/2019   Sepsis (HCC) 11/07/2019   Acute kidney injury (HCC) 11/07/2019   History of COVID-19 11/07/2019   COVID-19 virus detected 10/22/2019   Sore throat due to virus 10/22/2019   Urinary frequency 04/20/2019   Vaginal odor 04/20/2019   Chest pain varying with breathing    Fever and chills    Vitamin D deficiency 11/15/2018   Nausea 11/15/2018   Abnormal pulse oximetry    Hypokalemia 09/20/2018   Medication management 09/14/2018   Muscle spasms of both lower extremities 08/25/2018   Subjective visual disturbance of right eye 08/25/2018   Hb-SS disease without crisis (HCC) 04/13/2018   Chronic pain syndrome 03/16/2018   Chronic, continuous use of opioids 03/16/2018   Tachycardia with heart rate 100-120 beats per minute    Sickle cell crisis (HCC) 10/19/2017   Leukocytosis 10/19/2017   Thrombocytopenia (HCC)  10/19/2017   Morbid obesity with BMI of 50.0-59.9, adult (HCC) 10/19/2017   Sickle cell pain crisis (HCC) 10/15/2017    History reviewed. No pertinent surgical history.   OB History     Gravida  3   Para      Term      Preterm      AB      Living  3      SAB      IAB      Ectopic      Multiple      Live Births              Family History  Problem Relation Age of Onset   Hypertension Mother    Sickle cell trait Mother    Diabetes Father    Sickle cell trait Father     Social History   Tobacco Use   Smoking status: Never   Smokeless tobacco: Never  Vaping Use   Vaping Use: Never used  Substance Use Topics   Alcohol use: Not Currently   Drug use: Never    Home Medications Prior to Admission medications   Medication Sig Start Date End Date Taking? Authorizing Provider  crizanlizumab-tmca (ADAKVEO) 100 MG/10ML SOLN Inject 680 mg into the vein every 30 (thirty) days. 07/20/20  Yes Massie Maroon, FNP  folic acid (FOLVITE) 1 MG tablet Take 1 tablet (1 mg total) by mouth daily. 11/15/18  Yes Kallie Locks, FNP  ibuprofen (  ADVIL) 800 MG tablet TAKE 1 TABLET BY MOUTH EVERY 8 HOURS AS NEEDED FOR HEADACHE, MILD PAIN, MODERATE PAIN OR CRAMPING Patient taking differently: Take 800 mg by mouth every 8 (eight) hours as needed for headache or mild pain. 09/27/20  Yes Massie Maroon, FNP  ondansetron (ZOFRAN) 4 MG tablet TAKE 1 TABLET BY MOUTH EVERY 8 HOURS AS NEEDED FOR NAUSEA AND VOMITING Patient taking differently: Take 4 mg by mouth every 8 (eight) hours as needed for nausea or vomiting. 10/01/20  Yes Massie Maroon, FNP  oxyCODONE ER (XTAMPZA ER) 18 MG C12A Take 18 mg by mouth every 12 (twelve) hours. 09/21/20  Yes Massie Maroon, FNP  Oxycodone HCl 20 MG TABS Take 1 tablet (20 mg total) by mouth every 4 (four) hours as needed for up to 15 days (pain). 10/10/20 10/25/20 Yes Massie Maroon, FNP  tiZANidine (ZANAFLEX) 4 MG capsule Take 4 mg by mouth 3  (three) times daily. 10/07/20  Yes [provider]  valACYclovir (VALTREX) 500 MG tablet Take Valtrex 500 mg daily for suppression of outbreaks. Take Valtrex 1000 mg daily X 1 week for outbreaks. Patient taking differently: Take 500 mg by mouth daily. 10/20/19  Yes Kallie Locks, FNP  gabapentin (NEURONTIN) 300 MG capsule Take 300 mg by mouth 3 (three) times daily.    [provider]  Vitamin D, Ergocalciferol, (DRISDOL) 1.25 MG (50000 UT) CAPS capsule Take 1 capsule (50,000 Units total) by mouth every 7 (seven) days. Patient not taking: No sig reported 12/21/18   Kallie Locks, FNP    Allergies    Patient has no known allergies.  Review of Systems   Review of Systems  Constitutional:        Per HPI, otherwise negative  HENT:         Per HPI, otherwise negative  Respiratory:         Per HPI, otherwise negative  Cardiovascular:        Per HPI, otherwise negative  Gastrointestinal:  Negative for vomiting.  Endocrine:       Negative aside from HPI  Genitourinary:        Neg aside from HPI   Musculoskeletal:        Per HPI, otherwise negative  Skin: Negative.   Allergic/Immunologic: Positive for immunocompromised state.  Neurological:  Negative for syncope.  Hematological:        Per HPI otherwise negative   Physical Exam Updated Vital Signs BP 129/79   Pulse 95   Temp 98.4 F (36.9 C) (Oral)   Resp 16   Ht 5\' 5"  (1.651 m)   Wt 136.1 kg   LMP 09/14/2020 (Exact Date)   SpO2 96%   BMI 49.92 kg/m   Physical Exam Vitals and nursing note reviewed.  Constitutional:      General: She is not in acute distress.    Appearance: She is well-developed.  HENT:     Head: Normocephalic and atraumatic.  Eyes:     Conjunctiva/sclera: Conjunctivae normal.  Cardiovascular:     Rate and Rhythm: Normal rate and regular rhythm.  Pulmonary:     Effort: Pulmonary effort is normal. No respiratory distress.     Breath sounds: Normal breath sounds. No stridor.   Abdominal:     General: There is no distension.  Skin:    General: Skin is warm and dry.  Neurological:     Mental Status: She is alert and oriented to person, place, and time.  Cranial Nerves: No cranial nerve deficit.    ED Results / Procedures / Treatments   Labs (all labs ordered are listed, but only abnormal results are displayed) Labs Reviewed  CBC WITH DIFFERENTIAL/PLATELET - Abnormal; Notable for the following components:      Result Value   RBC 5.17 (*)    HCT 34.7 (*)    MCV 67.1 (*)    MCH 23.4 (*)    RDW 15.9 (*)    All other components within normal limits  RETICULOCYTES - Abnormal; Notable for the following components:   RBC. 5.19 (*)    Immature Retic Fract 31.0 (*)    All other components within normal limits  COMPREHENSIVE METABOLIC PANEL - Abnormal; Notable for the following components:   Chloride 114 (*)    Glucose, Bld 110 (*)    AST 12 (*)    All other components within normal limits  I-STAT BETA HCG BLOOD, ED ([facility], [facility], [facility] ONLY)    EKG None  Radiology No results found.  Procedures Procedures   Medications Ordered in ED Medications  dextrose 5 %-0.45 % sodium chloride infusion ( Intravenous New Bag/Given 10/09/20 0952)  oxyCODONE (Oxy IR/ROXICODONE) immediate release tablet 15 mg (0 mg Oral Hold 10/09/20 1111)  diphenhydrAMINE (BENADRYL) capsule 25-50 mg (has no administration in time range)  ondansetron (ZOFRAN) injection 4 mg (4 mg Intravenous Given 10/09/20 0950)  HYDROmorphone (DILAUDID) injection 2 mg (has no administration in time range)  ketorolac (TORADOL) 15 MG/ML injection 15 mg (15 mg Intravenous Given 10/09/20 0950)  HYDROmorphone (DILAUDID) injection 2 mg (2 mg Intravenous Given 10/09/20 0950)  HYDROmorphone (DILAUDID) injection 2 mg (2 mg Intravenous Given 10/09/20 1055)    ED Course  I have reviewed the triage vital signs and the nursing notes.  Pertinent labs & imaging results that were available during my care of the patient were  reviewed by me and considered in my medical decision making (see chart for details).  Pulse ox 99% room air normal 11:22 AM Patient feeling better.  Labs reviewed, discussed, no evidence for decompensated sickle cell state.  With her denial of chest pain, dyspnea, no hypoxia, low suspicion for acute chest or other acute new phenomena.  Patient's recent stress from travel, not having medication likely contributing to today's presentation.  Patient amenable to discharge with obtaining prescriptions tomorrow, following up with primary care. MDM Rules/Calculators/A&P MDM Number of Diagnoses or Management Options Sickle cell pain crisis (HCC): established, worsening   Amount and/or Complexity of Data Reviewed Clinical lab tests: reviewed and ordered Tests in the medicine section of CPT: reviewed and ordered Decide to obtain previous medical records or to obtain history from someone other than the patient: yes Review and summarize past medical records: yes Independent visualization of images, tracings, or specimens: yes  Risk of Complications, Morbidity, and/or Mortality Presenting problems: high Diagnostic procedures: high Management options: high  Critical Care Total time providing critical care: < 30 minutes  Patient Progress Patient progress: improved   Final Clinical Impression(s) / ED Diagnoses Final diagnoses:  Sickle cell pain crisis (HCC)     Gerhard Munch, MD 10/09/20 1124

## 2020-10-10 ENCOUNTER — Telehealth: Payer: Self-pay

## 2020-10-10 NOTE — Telephone Encounter (Signed)
Transition Care Management Unsuccessful Follow-up Telephone Call  Date of discharge and from where:  10/09/2020 from [place]  Attempts:  1st Attempt  Reason for unsuccessful TCM follow-up call:  Left voice message    

## 2020-10-11 NOTE — Telephone Encounter (Signed)
Transition Care Management Follow-up Telephone Call Date of discharge and from where: 10/09/2020 - [facility] How have you been since you were released from the hospital? "Still in pain" Any questions or concerns? No  Items Reviewed: Did the pt receive and understand the discharge instructions provided? Yes  Medications obtained and verified? Yes  Other? No  Any new allergies since your discharge? No  Dietary orders reviewed? No Do you have support at home? Yes    Functional Questionnaire: (I = Independent and D = Dependent) ADLs: I  Bathing/Dressing- I  Meal Prep- I  Eating- I  Maintaining continence- I  Transferring/Ambulation- I  Managing Meds- I  Follow up appointments reviewed:  PCP Hospital f/u appt confirmed? No  - has routine appointment in October Specialist Hospital f/u appt confirmed? No   Are transportation arrangements needed? No  If their condition worsens, is the pt aware to call PCP or go to the Emergency Dept.? Yes Was the patient provided with contact information for the PCP's office or ED? Yes Was to pt encouraged to call back with questions or concerns? Yes

## 2020-10-19 ENCOUNTER — Emergency Department (HOSPITAL_COMMUNITY): Payer: Medicare Other

## 2020-10-19 ENCOUNTER — Emergency Department (HOSPITAL_COMMUNITY)
Admission: EM | Admit: 2020-10-19 | Discharge: 2020-10-19 | Disposition: A | Discharge status: Home / Self Care | Payer: Medicare Other | Attending: Emergency Medicine | Admitting: Emergency Medicine

## 2020-10-19 ENCOUNTER — Encounter (HOSPITAL_COMMUNITY): Payer: Self-pay

## 2020-10-19 ENCOUNTER — Other Ambulatory Visit (HOSPITAL_COMMUNITY): Payer: Self-pay

## 2020-10-19 DIAGNOSIS — D5700 Hb-SS disease with crisis, unspecified: Secondary | ICD-10-CM | POA: Diagnosis present

## 2020-10-19 DIAGNOSIS — Z8616 Personal history of COVID-19: Secondary | ICD-10-CM | POA: Diagnosis not present

## 2020-10-19 LAB — CBC WITH DIFFERENTIAL/PLATELET
Abs Immature Granulocytes: 0.03 10*3/uL (ref 0.00–0.07)
Basophils Absolute: 0 10*3/uL (ref 0.0–0.1)
Basophils Relative: 1 %
Eosinophils Absolute: 0.2 10*3/uL (ref 0.0–0.5)
Eosinophils Relative: 2 %
HCT: 37.6 % (ref 36.0–46.0)
Hemoglobin: 12.5 g/dL (ref 12.0–15.0)
Immature Granulocytes: 0 %
Lymphocytes Relative: 20 %
Lymphs Abs: 1.7 10*3/uL (ref 0.7–4.0)
MCH: 22.9 pg — ABNORMAL LOW (ref 26.0–34.0)
MCHC: 33.2 g/dL (ref 30.0–36.0)
MCV: 69 fL — ABNORMAL LOW (ref 80.0–100.0)
Monocytes Absolute: 0.8 10*3/uL (ref 0.1–1.0)
Monocytes Relative: 9 %
Neutro Abs: 5.6 10*3/uL (ref 1.7–7.7)
Neutrophils Relative %: 68 %
Platelets: 254 10*3/uL (ref 150–400)
RBC: 5.45 MIL/uL — ABNORMAL HIGH (ref 3.87–5.11)
RDW: 17.6 % — ABNORMAL HIGH (ref 11.5–15.5)
WBC: 8.3 10*3/uL (ref 4.0–10.5)
nRBC: 0.2 % (ref 0.0–0.2)

## 2020-10-19 LAB — BASIC METABOLIC PANEL
Anion gap: 11 (ref 5–15)
BUN: 7 mg/dL (ref 6–20)
CO2: 21 mmol/L — ABNORMAL LOW (ref 22–32)
Calcium: 9.3 mg/dL (ref 8.9–10.3)
Chloride: 107 mmol/L (ref 98–111)
Creatinine, Ser: 0.67 mg/dL (ref 0.44–1.00)
GFR, Estimated: >60 mL/min (ref >60)
Glucose, Bld: 101 mg/dL — ABNORMAL HIGH (ref 70–99)
Potassium: 4 mmol/L (ref 3.5–5.1)
Sodium: 139 mmol/L (ref 135–145)

## 2020-10-19 LAB — TROPONIN I (HIGH SENSITIVITY)
Troponin I (High Sensitivity): <2 ng/L (ref <18)
Troponin I (High Sensitivity): 2 ng/L (ref <18)

## 2020-10-19 LAB — RETICULOCYTES
Immature Retic Fract: 40 % — ABNORMAL HIGH (ref 2.3–15.9)
RBC.: 5.29 MIL/uL — ABNORMAL HIGH (ref 3.87–5.11)
Retic Count, Absolute: 219 10*3/uL — ABNORMAL HIGH (ref 19.0–186.0)
Retic Ct Pct: 4.1 % — ABNORMAL HIGH (ref 0.4–3.1)

## 2020-10-19 MED ORDER — HYDROMORPHONE HCL 2 MG/ML IJ SOLN
2.0000 mg | INTRAMUSCULAR | Status: AC
  Administered 2020-10-19: 2 mg via INTRAVENOUS
  Filled 2020-10-19: qty 1

## 2020-10-19 MED ORDER — OXYCODONE HCL 20 MG PO TABS
20.0000 mg | ORAL_TABLET | Freq: Three times a day (TID) | ORAL | 0 refills | Status: DC | PRN
  Filled 2020-10-19: qty 9, 3d supply, fill #0

## 2020-10-19 MED ORDER — KETOROLAC TROMETHAMINE 30 MG/ML IJ SOLN
30.0000 mg | Freq: Once | INTRAMUSCULAR | Status: AC
  Administered 2020-10-19: 30 mg via INTRAVENOUS
  Filled 2020-10-19: qty 1

## 2020-10-19 MED ORDER — DEXTROSE-NACL 5-0.45 % IV SOLN: INTRAVENOUS | Status: DC

## 2020-10-19 MED ORDER — DIPHENHYDRAMINE HCL 25 MG PO CAPS
25.0000 mg | ORAL_CAPSULE | ORAL | Status: DC | PRN
  Administered 2020-10-19: 50 mg via ORAL
  Filled 2020-10-19: qty 2

## 2020-10-19 MED ORDER — OXYCODONE HCL 20 MG PO TABS: 20.0000 mg | ORAL_TABLET | Freq: Three times a day (TID) | ORAL | 0 refills | Status: DC | PRN

## 2020-10-19 MED ORDER — ONDANSETRON HCL 4 MG/2ML IJ SOLN
4.0000 mg | INTRAMUSCULAR | Status: DC | PRN
  Administered 2020-10-19: 4 mg via INTRAVENOUS
  Filled 2020-10-19: qty 2

## 2020-10-19 MED ORDER — HYDROMORPHONE HCL 2 MG/ML IJ SOLN
2.0000 mg | INTRAMUSCULAR | Status: AC
  Administered 2020-10-19 (×2): 2 mg via INTRAVENOUS
  Filled 2020-10-19 (×2): qty 1

## 2020-10-19 NOTE — ED Notes (Signed)
IV team at bedside 

## 2020-10-19 NOTE — Discharge Instructions (Addendum)
Continue your pain medications as prescribed.  Follow-up with your doctors at the sickle cell clinic

## 2020-10-19 NOTE — ED Triage Notes (Signed)
Pt arrived via POV, c/o SCC. Pain to legs, left arm and chest.

## 2020-10-19 NOTE — ED Provider Notes (Addendum)
[facility]-EMERGENCY DEPT Provider Note   CSN: [id number] Arrival date & time: 10/19/20  6222     History Chief Complaint  Patient presents with   Sickle Cell Pain Crisis    Stephanie Burch is a 40 y.o. female.   Sickle Cell Pain Crisis  Patient presents to the ED for evaluation of sickle cell pain crisis.  Patient states she started having symptoms a couple days ago.  She is having pains in her arms and legs.  Today she also started to have some sharp pain in her chest.  She is not having any shortness of breath.  She is not having any fevers or chills.  No vomiting or diarrhea.  Patient symptoms are typical for her sickle cell crisis with the exception of having chest pain this morning Past Medical History:  Diagnosis Date   HSV infection    Nausea    Sickle cell anemia (HCC)    Vitamin B12 deficiency 12/2018   Vitamin D deficiency     Patient Active Problem List   Diagnosis Date Noted   Sickle cell disease with crisis (HCC) 09/24/2020   Sickle cell anemia with crisis (HCC) 06/12/2020   Pulmonary edema 11/11/2019   Hypoxia    Poor venous access 11/09/2019   HCAP (healthcare-associated pneumonia) 11/07/2019   Sepsis (HCC) 11/07/2019   Acute kidney injury (HCC) 11/07/2019   History of COVID-19 11/07/2019   COVID-19 virus detected 10/22/2019   Sore throat due to virus 10/22/2019   Urinary frequency 04/20/2019   Vaginal odor 04/20/2019   Chest pain varying with breathing    Fever and chills    Vitamin D deficiency 11/15/2018   Nausea 11/15/2018   Abnormal pulse oximetry    Hypokalemia 09/20/2018   Medication management 09/14/2018   Muscle spasms of both lower extremities 08/25/2018   Subjective visual disturbance of right eye 08/25/2018   Hb-SS disease without crisis (HCC) 04/13/2018   Chronic pain syndrome 03/16/2018   Chronic, continuous use of opioids 03/16/2018   Tachycardia with heart rate 100-120 beats per minute    Sickle cell crisis  (HCC) 10/19/2017   Leukocytosis 10/19/2017   Thrombocytopenia (HCC) 10/19/2017   Morbid obesity with BMI of 50.0-59.9, adult (HCC) 10/19/2017   Sickle cell pain crisis (HCC) 10/15/2017    History reviewed. No pertinent surgical history.   OB History     Gravida  3   Para      Term      Preterm      AB      Living  3      SAB      IAB      Ectopic      Multiple      Live Births              Family History  Problem Relation Age of Onset   Hypertension Mother    Sickle cell trait Mother    Diabetes Father    Sickle cell trait Father     Social History   Tobacco Use   Smoking status: Never   Smokeless tobacco: Never  Vaping Use   Vaping Use: Never used  Substance Use Topics   Alcohol use: Not Currently   Drug use: Never    Home Medications Prior to Admission medications   Medication Sig Start Date End Date Taking? Authorizing Provider  Oxycodone HCl 20 MG TABS Take 1 tablet (20 mg total) by mouth every 8 (eight) hours as  needed for up to 3 days. 10/19/20 10/22/20 Yes Linwood Dibbles, MD  crizanlizumab-tmca (ADAKVEO) 100 MG/10ML SOLN Inject 680 mg into the vein every 30 (thirty) days. 07/20/20   Massie Maroon, FNP  folic acid (FOLVITE) 1 MG tablet Take 1 tablet (1 mg total) by mouth daily. 11/15/18   Kallie Locks, FNP  gabapentin (NEURONTIN) 300 MG capsule Take 300 mg by mouth 3 (three) times daily.    [provider]  ibuprofen (ADVIL) 800 MG tablet TAKE 1 TABLET BY MOUTH EVERY 8 HOURS AS NEEDED FOR HEADACHE, MILD PAIN, MODERATE PAIN OR CRAMPING Patient taking differently: Take 800 mg by mouth every 8 (eight) hours as needed for headache or mild pain. 09/27/20   Massie Maroon, FNP  ondansetron (ZOFRAN) 4 MG tablet TAKE 1 TABLET BY MOUTH EVERY 8 HOURS AS NEEDED FOR NAUSEA AND VOMITING Patient taking differently: Take 4 mg by mouth every 8 (eight) hours as needed for nausea or vomiting. 10/01/20   Massie Maroon, FNP  oxyCODONE ER  (XTAMPZA ER) 18 MG C12A Take 18 mg by mouth every 12 (twelve) hours. 09/21/20   Massie Maroon, FNP  Oxycodone HCl 20 MG TABS Take 1 tablet (20 mg total) by mouth every 4 (four) hours as needed for up to 15 days (pain). 10/10/20 10/25/20  Massie Maroon, FNP  tiZANidine (ZANAFLEX) 4 MG capsule Take 4 mg by mouth 3 (three) times daily. 10/07/20   [provider]  valACYclovir (VALTREX) 500 MG tablet Take Valtrex 500 mg daily for suppression of outbreaks. Take Valtrex 1000 mg daily X 1 week for outbreaks. Patient taking differently: Take 500 mg by mouth daily. 10/20/19   Kallie Locks, FNP  Vitamin D, Ergocalciferol, (DRISDOL) 1.25 MG (50000 UT) CAPS capsule Take 1 capsule (50,000 Units total) by mouth every 7 (seven) days. Patient not taking: No sig reported 12/21/18   Kallie Locks, FNP    Allergies    Patient has no known allergies.  Review of Systems   Review of Systems  All other systems reviewed and are negative.  Physical Exam Updated Vital Signs BP (!) 140/95   Pulse (!) 101   Temp 98.5 F (36.9 C) (Oral)   Resp 18   SpO2 96%   Physical Exam Vitals and nursing note reviewed.  Constitutional:      Appearance: She is well-developed. She is ill-appearing.     Comments: Appears to be in pain  HENT:     Head: Normocephalic and atraumatic.     Right Ear: External ear normal.     Left Ear: External ear normal.  Eyes:     General: No scleral icterus.       Right eye: No discharge.        Left eye: No discharge.     Conjunctiva/sclera: Conjunctivae normal.  Neck:     Trachea: No tracheal deviation.  Cardiovascular:     Rate and Rhythm: Normal rate and regular rhythm.  Pulmonary:     Effort: Pulmonary effort is normal. No respiratory distress.     Breath sounds: Normal breath sounds. No stridor. No wheezing or rales.  Abdominal:     General: Bowel sounds are normal. There is no distension.     Palpations: Abdomen is soft.     Tenderness: There is no  abdominal tenderness. There is no guarding or rebound.  Musculoskeletal:        General: No tenderness or deformity.     Cervical  back: Neck supple.  Skin:    General: Skin is warm and dry.     Findings: No rash.  Neurological:     General: No focal deficit present.     Mental Status: She is alert.     Cranial Nerves: No cranial nerve deficit (no facial droop, extraocular movements intact, no slurred speech).     Sensory: No sensory deficit.     Motor: No abnormal muscle tone or seizure activity.     Coordination: Coordination normal.  Psychiatric:        Mood and Affect: Mood normal.    ED Results / Procedures / Treatments   Labs (all labs ordered are listed, but only abnormal results are displayed) Labs Reviewed  CBC WITH DIFFERENTIAL/PLATELET - Abnormal; Notable for the following components:      Result Value   RBC 5.45 (*)    MCV 69.0 (*)    MCH 22.9 (*)    RDW 17.6 (*)    All other components within normal limits  BASIC METABOLIC PANEL - Abnormal; Notable for the following components:   CO2 21 (*)    Glucose, Bld 101 (*)    All other components within normal limits  RETICULOCYTES - Abnormal; Notable for the following components:   Retic Ct Pct 4.1 (*)    RBC. 5.29 (*)    Retic Count, Absolute 219.0 (*)    Immature Retic Fract 40.0 (*)    All other components within normal limits  TROPONIN I (HIGH SENSITIVITY)  TROPONIN I (HIGH SENSITIVITY)    EKG EKG not in muse.  Normal sinus rhythm rate 96 Borderline repull changes Prolonged QT interval no acute ST-T wave changes No prior EKG for comparison  Radiology DG Chest Port 1 View  Result Date: 10/19/2020 CLINICAL DATA:  Central chest pain for 2 days EXAM: PORTABLE CHEST 1 VIEW COMPARISON:  07/15/2020 FINDINGS: Low volume chest with borderline heart size. Mild density at the bases could be atelectasis or soft tissue attenuation. Normal aortic and hilar contours. New osseous findings IMPRESSION: Stable low volume  chest when compared to June 2022. Electronically Signed   By: Tiburcio Pea M.D.   On: 10/19/2020 09:32    Procedures Procedures   Medications Ordered in ED Medications  dextrose 5 %-0.45 % sodium chloride infusion (0 mLs Intravenous Stopped 10/19/20 1434)  diphenhydrAMINE (BENADRYL) capsule 25-50 mg (50 mg Oral Given 10/19/20 1015)  ondansetron (ZOFRAN) injection 4 mg (4 mg Intravenous Given 10/19/20 0928)  HYDROmorphone (DILAUDID) injection 2 mg (2 mg Intravenous Given 10/19/20 0934)  HYDROmorphone (DILAUDID) injection 2 mg (2 mg Intravenous Given 10/19/20 1020)  HYDROmorphone (DILAUDID) injection 2 mg (2 mg Intravenous Given 10/19/20 1408)  ketorolac (TORADOL) 30 MG/ML injection 30 mg (30 mg Intravenous Given 10/19/20 1335)    ED Course  I have reviewed the triage vital signs and the nursing notes.  Pertinent labs & imaging results that were available during my care of the patient were reviewed by me and considered in my medical decision making (see chart for details).  Clinical Course as of 10/19/20 1541  Fri Oct 19, 2020  8938 Blood for hcg test was not collected by staff.  Pt states there is no way she could be pregnant.   Will cancel hcg  [JK]  1019 Labs show normal hemoglobin.  Metabolic panel normal.  Troponin is normal [JK]  1019 Chest x-ray without acute findings. [JK]  1203 D/w Dr Hyman Hopes.  Recommend continuing her regimen here in the  ED.   Pt can follow up on Monday in the clinic.   [JK]  1500 Patient still having pain but has received 4 doses of medications.  She does admit to running out of her home medications.  I will prescribe a short course of pain meds to have available at home.  Plan is outpatient follow-up with the sickle cell center [JK]    Clinical Course User Index [JK] Linwood Dibbles, MD   MDM Rules/Calculators/A&P                           Patient presents to the ED with complaints of sickle cell pain crisis.  ED work-up is overall reassuring.  No signs of acute  infection.  No signs of anemia.  EKG and chest x-ray are normal.  Doubt acute chest syndrome or PE.  Case was discussed with Dr. Hyman Hopes a from the sickle cell center.  We will plan outpatient management.  Patient does state that she ran out of her home pain medications.  We will give a 3-day supply of medication used and plan to have her follow-up in the sickle cell clinic on Monday Final Clinical Impression(s) / ED Diagnoses Final diagnoses:  Sickle cell pain crisis [facility])    Rx / DC Orders ED Discharge Orders          Ordered    Oxycodone HCl 20 MG TABS  Every 8 hours PRN        10/19/20 1505             Linwood Dibbles, MD 10/19/20 1506 Pharmacy changed to direct patient pharmacy as requested by patient   Linwood Dibbles, MD 10/19/20 1541

## 2020-10-20 ENCOUNTER — Telehealth: Payer: Self-pay

## 2020-10-20 ENCOUNTER — Other Ambulatory Visit: Payer: Self-pay

## 2020-10-20 DIAGNOSIS — G894 Chronic pain syndrome: Secondary | ICD-10-CM

## 2020-10-20 DIAGNOSIS — F1190 Opioid use, unspecified, uncomplicated: Secondary | ICD-10-CM

## 2020-10-20 DIAGNOSIS — D571 Sickle-cell disease without crisis: Secondary | ICD-10-CM

## 2020-10-20 NOTE — Telephone Encounter (Signed)
Transition Care Management Unsuccessful Follow-up Telephone Call  Date of discharge and from where:  10/19/2020 from [facility]  Attempts:  1st Attempt  Reason for unsuccessful TCM follow-up call:  Left voice message

## 2020-10-21 ENCOUNTER — Other Ambulatory Visit: Payer: Self-pay

## 2020-10-21 DIAGNOSIS — F1190 Opioid use, unspecified, uncomplicated: Secondary | ICD-10-CM

## 2020-10-21 DIAGNOSIS — G894 Chronic pain syndrome: Secondary | ICD-10-CM

## 2020-10-21 DIAGNOSIS — D571 Sickle-cell disease without crisis: Secondary | ICD-10-CM

## 2020-10-21 NOTE — Telephone Encounter (Signed)
Transition Care Management Unsuccessful Follow-up Telephone Call  Date of discharge and from where:  10/19/2020 from [facility]  Attempts:  2nd Attempt  Reason for unsuccessful TCM follow-up call:  Left voice message

## 2020-10-22 ENCOUNTER — Other Ambulatory Visit: Payer: Self-pay | Admitting: Internal Medicine

## 2020-10-22 ENCOUNTER — Telehealth: Payer: Self-pay

## 2020-10-22 DIAGNOSIS — D571 Sickle-cell disease without crisis: Secondary | ICD-10-CM

## 2020-10-22 DIAGNOSIS — G894 Chronic pain syndrome: Secondary | ICD-10-CM

## 2020-10-22 DIAGNOSIS — F1190 Opioid use, unspecified, uncomplicated: Secondary | ICD-10-CM

## 2020-10-22 MED ORDER — XTAMPZA ER 18 MG PO C12A: 18.0000 mg | EXTENDED_RELEASE_CAPSULE | Freq: Two times a day (BID) | ORAL | 0 refills | Status: DC

## 2020-10-22 NOTE — Telephone Encounter (Signed)
Oxycodone  Stephanie Burch

## 2020-10-22 NOTE — Telephone Encounter (Signed)
Transition Care Management Unsuccessful Follow-up Telephone Call  Date of discharge and from where:  10/19/2020 from [facility]  Attempts:  3rd Attempt  Reason for unsuccessful TCM follow-up call:  Unable to reach patient

## 2020-10-23 ENCOUNTER — Telehealth: Payer: Self-pay

## 2020-10-23 ENCOUNTER — Other Ambulatory Visit: Payer: Self-pay

## 2020-10-23 DIAGNOSIS — F1190 Opioid use, unspecified, uncomplicated: Secondary | ICD-10-CM

## 2020-10-23 DIAGNOSIS — G894 Chronic pain syndrome: Secondary | ICD-10-CM

## 2020-10-23 DIAGNOSIS — D571 Sickle-cell disease without crisis: Secondary | ICD-10-CM

## 2020-10-23 MED ORDER — ONDANSETRON HCL 4 MG PO TABS: 4.0000 mg | ORAL_TABLET | Freq: Three times a day (TID) | ORAL | 0 refills | Status: DC | PRN

## 2020-10-23 NOTE — Telephone Encounter (Signed)
Patient is due for AWV, I attempted to reach patient no answer or voice mail to leave a message.

## 2020-10-24 ENCOUNTER — Other Ambulatory Visit: Payer: Self-pay | Admitting: Family Medicine

## 2020-10-24 DIAGNOSIS — D571 Sickle-cell disease without crisis: Secondary | ICD-10-CM

## 2020-10-24 DIAGNOSIS — G894 Chronic pain syndrome: Secondary | ICD-10-CM

## 2020-10-24 DIAGNOSIS — F1190 Opioid use, unspecified, uncomplicated: Secondary | ICD-10-CM

## 2020-10-24 MED ORDER — OXYCODONE HCL 20 MG PO TABS: 20.0000 mg | ORAL_TABLET | ORAL | 0 refills | Status: DC | PRN

## 2020-10-24 NOTE — Progress Notes (Signed)
Reviewed PDMP substance reporting system prior to prescribing opiate medications. No inconsistencies noted.  Meds ordered this encounter  Medications   Oxycodone HCl 20 MG TABS    Sig: Take 1 tablet (20 mg total) by mouth every 4 (four) hours as needed for up to 15 days (pain).    Dispense:  90 tablet    Refill:  0    Order Specific Question:   Supervising Provider    Answer:   JEGEDE, OLUGBEMIGA E [[id number]]     Stephanie Burch Adael Culbreath  APRN, MSN, FNP-C [facility] [address]  [place], [place] [address] [phone]  

## 2020-10-26 ENCOUNTER — Telehealth: Payer: Self-pay

## 2020-10-26 ENCOUNTER — Other Ambulatory Visit: Payer: Self-pay | Admitting: Family Medicine

## 2020-10-26 DIAGNOSIS — B009 Herpesviral infection, unspecified: Secondary | ICD-10-CM

## 2020-10-26 MED ORDER — VALACYCLOVIR HCL 500 MG PO TABS: ORAL_TABLET | ORAL | 11 refills | Status: DC

## 2020-10-26 NOTE — Progress Notes (Signed)
Reviewed PDMP substance reporting system prior to prescribing opiate medications. No inconsistencies noted.  Meds ordered this encounter  Medications   valACYclovir (VALTREX) 500 MG tablet    Sig: Take Valtrex 500 mg daily for suppression of outbreaks. Take Valtrex 1000 mg daily X 1 week for outbreaks.    Dispense:  90 tablet    Refill:  11    Order Specific Question:   Supervising Provider    Answer:   Quentin Angst [[id number]]     Nolon Nations  APRN, MSN, FNP-C [facility] [address] [place], [place] [address] [phone]

## 2020-10-26 NOTE — Telephone Encounter (Signed)
Patient called to schedule medicare wellness visit, left VM to return the call to the office to schedule.

## 2020-10-30 ENCOUNTER — Telehealth: Payer: Self-pay

## 2020-10-30 ENCOUNTER — Ambulatory Visit: Payer: Medicare Other

## 2020-10-30 NOTE — Telephone Encounter (Signed)
Called pt x 3 to start AWV that pt scheduled on 9/26. Lft msg for pt to contact office.

## 2020-10-30 NOTE — Progress Notes (Unsigned)
Subjective:   Stephanie Burch is a 40 y.o. female who presents for an Initial Medicare Annual Wellness Visit.  I connected with  Stephanie Burch on 10/30/20 by audio enabled telemedicine application and verified that I am speaking with the correct person using two identifiers.   I discussed the limitations of evaluation and management by telemedicine. The patient expressed understanding and agreed to proceed  Location of Patient: Home Location of Provider: Office   List any persons and their roles That are participating in the visit with the patient. Stephanie Franklin, LPN   Review of Systems    Defer to PCP       Objective:    There were no vitals filed for this visit. There is no height or weight on file to calculate BMI.  Advanced Directives 10/09/2020 09/24/2020 09/06/2020 09/05/2020 09/05/2020 08/22/2020 08/13/2020  Does Patient Have a Medical Advance Directive? No No No No No No No  Would patient like information on creating a medical advance directive? No - Patient declined No - Patient declined No - Patient declined No - Patient declined No - Patient declined No - Patient declined No - Patient declined    Current Medications (verified) Outpatient Encounter Medications as of 10/30/2020  Medication Sig   crizanlizumab-tmca (ADAKVEO) 100 MG/10ML SOLN Inject 680 mg into the vein every 30 (thirty) days.   folic acid (FOLVITE) 1 MG tablet Take 1 tablet (1 mg total) by mouth daily.   gabapentin (NEURONTIN) 300 MG capsule Take 300 mg by mouth 3 (three) times daily.   ibuprofen (ADVIL) 800 MG tablet TAKE 1 TABLET BY MOUTH EVERY 8 HOURS AS NEEDED FOR HEADACHE, MILD PAIN, MODERATE PAIN OR CRAMPING (Patient taking differently: Take 800 mg by mouth every 8 (eight) hours as needed for headache or mild pain.)   ondansetron (ZOFRAN) 4 MG tablet Take 1 tablet (4 mg total) by mouth every 8 (eight) hours as needed for nausea or vomiting.   oxyCODONE ER (XTAMPZA ER) 18 MG C12A Take 18 mg by mouth every 12  (twelve) hours.   Oxycodone HCl 20 MG TABS Take 1 tablet (20 mg total) by mouth every 4 (four) hours as needed for up to 15 days (pain).   tiZANidine (ZANAFLEX) 4 MG capsule Take 4 mg by mouth 3 (three) times daily.   valACYclovir (VALTREX) 500 MG tablet Take Valtrex 500 mg daily for suppression of outbreaks. Take Valtrex 1000 mg daily X 1 week for outbreaks.   Vitamin D, Ergocalciferol, (DRISDOL) 1.25 MG (50000 UT) CAPS capsule Take 1 capsule (50,000 Units total) by mouth every 7 (seven) days. (Patient not taking: No sig reported)   No facility-administered encounter medications on file as of 10/30/2020.    Allergies (verified) Patient has no known allergies.   History: Past Medical History:  Diagnosis Date   HSV infection    Nausea    Sickle cell anemia (HCC)    Vitamin B12 deficiency 12/2018   Vitamin D deficiency    No past surgical history on file. Family History  Problem Relation Age of Onset   Hypertension Mother    Sickle cell trait Mother    Diabetes Father    Sickle cell trait Father    Social History   Socioeconomic History   Marital status: Single    Spouse name: Not on file   Number of children: Not on file   Years of education: Not on file   Highest education level: Not on file  Occupational History  Not on file  Tobacco Use   Smoking status: Never   Smokeless tobacco: Never  Vaping Use   Vaping Use: Never used  Substance and Sexual Activity   Alcohol use: Not Currently   Drug use: Never   Sexual activity: Yes    Birth control/protection: None  Other Topics Concern   Not on file  Social History Narrative   Not on file   Social Determinants of Health   Financial Resource Strain: Not on file  Food Insecurity: Not on file  Transportation Needs: Not on file  Physical Activity: Not on file  Stress: Not on file  Social Connections: Not on file    Tobacco Counseling Counseling given: Not Answered   Clinical Intake:                  Diabetic?No         Activities of Daily Living In your present state of health, do you have any difficulty performing the following activities: 09/24/2020 09/06/2020  Hearing? N N  Vision? N N  Difficulty concentrating or making decisions? N N  Walking or climbing stairs? N N  Dressing or bathing? N N  Doing errands, shopping? - N  Some recent data might be hidden    Patient Care Team: Massie Maroon, FNP as PCP - General (Family Medicine)  Indicate any recent Medical Services you may have received from other than [facility] providers in the past year (date may be approximate).     Assessment:   This is a routine wellness examination for Stephanie Burch.  Hearing/Vision screen No results found.  Dietary issues and exercise activities discussed:     Goals Addressed   None   Depression Screen PHQ 2/9 Scores 01/06/2020 06/30/2019 04/27/2019 02/28/2019 01/18/2019 10/01/2018 10/01/2018  PHQ - 2 Score 0 2 0 0 0 4 2  PHQ- 9 Score - 10 - - - 17 15    Fall Risk Fall Risk  08/21/2020 01/06/2020 04/27/2019 02/28/2019 01/18/2019  Falls in the past year? 0 1 0 0 0  Number falls in past yr: 0 0 0 0 0  Injury with Fall? 0 0 0 0 0    FALL RISK PREVENTION PERTAINING TO THE HOME:  Any stairs in or around the home? {YES/NO:21197} If so, are there any without handrails? {YES/NO:21197} Home free of loose throw rugs in walkways, pet beds, electrical cords, etc? {YES/NO:21197} Adequate lighting in your home to reduce risk of falls? {YES/NO:21197}  ASSISTIVE DEVICES UTILIZED TO PREVENT FALLS:  Life alert? {YES/NO:21197} Use of a cane, walker or w/c? {YES/NO:21197} Grab bars in the bathroom? {YES/NO:21197} Shower chair or bench in shower? {YES/NO:21197} Elevated toilet seat or a handicapped toilet? {YES/NO:21197}  TIMED UP AND GO:  Was the test performed?  N/A .  Length of time to ambulate 10 feet: N/A sec.     Cognitive Function:        Immunizations Immunization History   Administered Date(s) Administered   Pneumococcal Polysaccharide-23 08/21/2020    TDAP status: Up to date  {Flu Vaccine status:2101806}  {Pneumococcal vaccine status:2101807}  Covid-19 vaccine status: Information provided on how to obtain vaccines.   Qualifies for Shingles Vaccine? No   Zostavax completed No   Shingrix Completed?: No.    Education has been provided regarding the importance of this vaccine. Patient has been advised to call insurance company to determine out of pocket expense if they have not yet received this vaccine. Advised may also receive vaccine at local pharmacy  or Health Dept. Verbalized acceptance and understanding.  Screening Tests Health Maintenance  Topic Date Due   COVID-19 Vaccine (1) Never done   PAP SMEAR-Modifier  Never done   INFLUENZA VACCINE  Never done   TETANUS/TDAP  01/05/2021 (Originally 01/12/2002)   Hepatitis C Screening  08/21/2021 (Originally 01/12/2001)   HIV Screening  Completed   HPV VACCINES  Aged Out    Health Maintenance  Health Maintenance Due  Topic Date Due   COVID-19 Vaccine (1) Never done   PAP SMEAR-Modifier  Never done   INFLUENZA VACCINE  Never done          Lung Cancer Screening: (Low Dose CT Chest recommended if Age 18-80 years, 30 pack-year currently smoking OR have quit w/in 15years.) does not qualify.   Lung Cancer Screening Referral: N/A  Additional Screening:  Hepatitis C Screening: does qualify; Completed 08/21/2021  Vision Screening: Recommended annual ophthalmology exams for early detection of glaucoma and other disorders of the eye. Is the patient up to date with their annual eye exam?  {YES/NO:21197} Who is the provider or what is the name of the office in which the patient attends annual eye exams? *** If pt is not established with a provider, would they like to be referred to a provider to establish care? {YES/NO:21197}.   Dental Screening: Recommended annual dental exams for proper oral  hygiene  Community Resource Referral / Chronic Care Management: CRR required this visit?  {YES/NO:21197}  CCM required this visit?  {YES/NO:21197}     Plan:     I have personally reviewed and noted the following in the patient's chart:   Medical and social history Use of alcohol, tobacco or illicit drugs  Current medications and supplements including opioid prescriptions. {Opioid Prescriptions:[phone]} Functional ability and status Nutritional status Physical activity Advanced directives List of other physicians Hospitalizations, surgeries, and ER visits in previous 12 months Vitals Screenings to include cognitive, depression, and falls Referrals and appointments  In addition, I have reviewed and discussed with patient certain preventive protocols, quality metrics, and best practice recommendations. A written personalized care plan for preventive services as well as general preventive health recommendations were provided to patient.     Stephanie Franklin, LPN   8/84/1660   Nurse Notes: Non Face to Face 45 min vist.

## 2020-10-30 NOTE — Patient Instructions (Signed)
Health Maintenance, Female Adopting a healthy lifestyle and getting preventive care are important in promoting health and wellness. Ask your health care provider about: The right schedule for you to have regular tests and exams. Things you can do on your own to prevent diseases and keep yourself healthy. What should I know about diet, weight, and exercise? Eat a healthy diet  Eat a diet that includes plenty of vegetables, fruits, low-fat dairy products, and lean protein. Do not eat a lot of foods that are high in solid fats, added sugars, or sodium. Maintain a healthy weight Body mass index (BMI) is used to identify weight problems. It estimates body fat based on height and weight. Your health care provider can help determine your BMI and help you achieve or maintain a healthy weight. Get regular exercise Get regular exercise. This is one of the most important things you can do for your health. Most adults should: Exercise for at least 150 minutes each week. The exercise should increase your heart rate and make you sweat (moderate-intensity exercise). Do strengthening exercises at least twice a week. This is in addition to the moderate-intensity exercise. Spend less time sitting. Even light physical activity can be beneficial. Watch cholesterol and blood lipids Have your blood tested for lipids and cholesterol at 38 years of age, then have this test every 5 years. Have your cholesterol levels checked more often if: Your lipid or cholesterol levels are high. You are older than 38 years of age. You are at high risk for heart disease. What should I know about cancer screening? Depending on your health history and family history, you may need to have cancer screening at various ages. This may include screening for: Breast cancer. Cervical cancer. Colorectal cancer. Skin cancer. Lung cancer. What should I know about heart disease, diabetes, and high blood pressure? Blood pressure and heart  disease High blood pressure causes heart disease and increases the risk of stroke. This is more likely to develop in people who have high blood pressure readings, are of African descent, or are overweight. Have your blood pressure checked: Every 3-5 years if you are 18-39 years of age. Every year if you are 40 years old or older. Diabetes Have regular diabetes screenings. This checks your fasting blood sugar level. Have the screening done: Once every three years after age 40 if you are at a normal weight and have a low risk for diabetes. More often and at a younger age if you are overweight or have a high risk for diabetes. What should I know about preventing infection? Hepatitis B If you have a higher risk for hepatitis B, you should be screened for this virus. Talk with your health care provider to find out if you are at risk for hepatitis B infection. Hepatitis C Testing is recommended for: Everyone born from 1945 through 1965. Anyone with known risk factors for hepatitis C. Sexually transmitted infections (STIs) Get screened for STIs, including gonorrhea and chlamydia, if: You are sexually active and are younger than 38 years of age. You are older than 38 years of age and your health care provider tells you that you are at risk for this type of infection. Your sexual activity has changed since you were last screened, and you are at increased risk for chlamydia or gonorrhea. Ask your health care provider if you are at risk. Ask your health care provider about whether you are at high risk for HIV. Your health care provider may recommend a prescription medicine   to help prevent HIV infection. If you choose to take medicine to prevent HIV, you should first get tested for HIV. You should then be tested every 3 months for as long as you are taking the medicine. Pregnancy If you are about to stop having your period (premenopausal) and you may become pregnant, seek counseling before you get  pregnant. Take 400 to 800 micrograms (mcg) of folic acid every day if you become pregnant. Ask for birth control (contraception) if you want to prevent pregnancy. Osteoporosis and menopause Osteoporosis is a disease in which the bones lose minerals and strength with aging. This can result in bone fractures. If you are 65 years old or older, or if you are at risk for osteoporosis and fractures, ask your health care provider if you should: Be screened for bone loss. Take a calcium or vitamin D supplement to lower your risk of fractures. Be given hormone replacement therapy (HRT) to treat symptoms of menopause. Follow these instructions at home: Lifestyle Do not use any products that contain nicotine or tobacco, such as cigarettes, e-cigarettes, and chewing tobacco. If you need help quitting, ask your health care provider. Do not use street drugs. Do not share needles. Ask your health care provider for help if you need support or information about quitting drugs. Alcohol use Do not drink alcohol if: Your health care provider tells you not to drink. You are pregnant, may be pregnant, or are planning to become pregnant. If you drink alcohol: Limit how much you use to 0-1 drink a day. Limit intake if you are breastfeeding. Be aware of how much alcohol is in your drink. In the [place] one drink equals one 12 oz bottle of beer (355 mL), one 5 oz glass of wine (148 mL), or one 1 oz glass of hard liquor (44 mL). General instructions Schedule regular health, dental, and eye exams. Stay current with your vaccines. Tell your health care provider if: You often feel depressed. You have ever been abused or do not feel safe at home. Summary Adopting a healthy lifestyle and getting preventive care are important in promoting health and wellness. Follow your health care provider's instructions about healthy diet, exercising, and getting tested or screened for diseases. Follow your health care provider's  instructions on monitoring your cholesterol and blood pressure. This information is not intended to replace advice given to you by your health care provider. Make sure you discuss any questions you have with your health care provider. Document Revised: 03/30/2020 Document Reviewed: 01/13/2018 Elsevier Patient Education  2022 Elsevier Inc.  

## 2020-11-01 ENCOUNTER — Telehealth: Payer: Self-pay

## 2020-11-01 NOTE — Telephone Encounter (Signed)
Patient called, left VM to return the call to the office to schedule medicare wellness visit with a nurse. 

## 2020-11-03 ENCOUNTER — Other Ambulatory Visit: Payer: Self-pay | Admitting: Family Medicine

## 2020-11-03 DIAGNOSIS — M62838 Other muscle spasm: Secondary | ICD-10-CM

## 2020-11-03 DIAGNOSIS — D571 Sickle-cell disease without crisis: Secondary | ICD-10-CM

## 2020-11-05 ENCOUNTER — Inpatient Hospital Stay (HOSPITAL_COMMUNITY): Admission: RE | Admit: 2020-11-05 | Payer: Medicare Other | Source: Ambulatory Visit

## 2020-11-07 ENCOUNTER — Other Ambulatory Visit: Payer: Self-pay

## 2020-11-07 ENCOUNTER — Other Ambulatory Visit: Payer: Self-pay | Admitting: Family Medicine

## 2020-11-07 ENCOUNTER — Telehealth: Payer: Self-pay

## 2020-11-07 DIAGNOSIS — F1190 Opioid use, unspecified, uncomplicated: Secondary | ICD-10-CM

## 2020-11-07 DIAGNOSIS — D571 Sickle-cell disease without crisis: Secondary | ICD-10-CM

## 2020-11-07 DIAGNOSIS — G894 Chronic pain syndrome: Secondary | ICD-10-CM

## 2020-11-07 MED ORDER — OXYCODONE HCL 20 MG PO TABS: 20.0000 mg | ORAL_TABLET | ORAL | 0 refills | Status: DC | PRN

## 2020-11-07 NOTE — Progress Notes (Signed)
Reviewed PDMP substance reporting system prior to prescribing opiate medications. No inconsistencies noted.  Meds ordered this encounter  Medications   Oxycodone HCl 20 MG TABS    Sig: Take 1 tablet (20 mg total) by mouth every 4 (four) hours as needed for up to 15 days (pain).    Dispense:  90 tablet    Refill:  0    Order Specific Question:   Supervising Provider    Answer:   JEGEDE, OLUGBEMIGA E [[id number]]     Stephanie Burch Stephanie Jaye  APRN, MSN, FNP-C [facility] [address]  [place], [place] [address] [phone]  

## 2020-11-07 NOTE — Telephone Encounter (Signed)
Oxycodone 20 mg °

## 2020-11-08 MED ORDER — ONDANSETRON HCL 4 MG PO TABS: 4.0000 mg | ORAL_TABLET | Freq: Three times a day (TID) | ORAL | 0 refills | Status: DC | PRN

## 2020-11-12 ENCOUNTER — Inpatient Hospital Stay (HOSPITAL_COMMUNITY): Admission: RE | Admit: 2020-11-12 | Payer: Medicare Other | Source: Ambulatory Visit

## 2020-11-18 ENCOUNTER — Other Ambulatory Visit: Payer: Self-pay

## 2020-11-18 DIAGNOSIS — G894 Chronic pain syndrome: Secondary | ICD-10-CM

## 2020-11-18 DIAGNOSIS — F1190 Opioid use, unspecified, uncomplicated: Secondary | ICD-10-CM

## 2020-11-18 DIAGNOSIS — D571 Sickle-cell disease without crisis: Secondary | ICD-10-CM

## 2020-11-20 ENCOUNTER — Other Ambulatory Visit: Payer: Self-pay | Admitting: Family Medicine

## 2020-11-20 DIAGNOSIS — F1190 Opioid use, unspecified, uncomplicated: Secondary | ICD-10-CM

## 2020-11-20 DIAGNOSIS — G894 Chronic pain syndrome: Secondary | ICD-10-CM

## 2020-11-20 DIAGNOSIS — D571 Sickle-cell disease without crisis: Secondary | ICD-10-CM

## 2020-11-20 MED ORDER — ONDANSETRON HCL 4 MG PO TABS: 4.0000 mg | ORAL_TABLET | Freq: Three times a day (TID) | ORAL | 0 refills | Status: DC | PRN

## 2020-11-20 MED ORDER — XTAMPZA ER 18 MG PO C12A: 18.0000 mg | EXTENDED_RELEASE_CAPSULE | Freq: Two times a day (BID) | ORAL | 0 refills | Status: DC

## 2020-11-20 NOTE — Progress Notes (Signed)
Reviewed PDMP substance reporting system prior to prescribing opiate medications. No inconsistencies noted.  °Meds ordered this encounter  °Medications  ° oxyCODONE ER (XTAMPZA ER) 18 MG C12A  °  Sig: Take 18 mg by mouth every 12 (twelve) hours.  °  Dispense:  60 capsule  °  Refill:  0  °  Order Specific Question:   Supervising Provider  °  Answer:   JEGEDE, OLUGBEMIGA E [[id number]]  ° Stephanie Krah Moore Cortavius Montesinos  APRN, MSN, FNP-C °Patient Care Center °[facility] °[address]  °[place], [place] [address] °[phone] ° °

## 2020-11-22 ENCOUNTER — Other Ambulatory Visit: Payer: Self-pay

## 2020-11-22 ENCOUNTER — Other Ambulatory Visit: Payer: Self-pay | Admitting: Family Medicine

## 2020-11-22 DIAGNOSIS — F1190 Opioid use, unspecified, uncomplicated: Secondary | ICD-10-CM

## 2020-11-22 DIAGNOSIS — D571 Sickle-cell disease without crisis: Secondary | ICD-10-CM

## 2020-11-22 DIAGNOSIS — G894 Chronic pain syndrome: Secondary | ICD-10-CM

## 2020-11-22 MED ORDER — OXYCODONE HCL 20 MG PO TABS: 20.0000 mg | ORAL_TABLET | ORAL | 0 refills | Status: DC | PRN

## 2020-11-22 NOTE — Progress Notes (Unsigned)
Reviewed PDMP substance reporting system prior to prescribing opiate medications. No inconsistencies noted.  Meds ordered this encounter  Medications   Oxycodone HCl 20 MG TABS    Sig: Take 1 tablet (20 mg total) by mouth every 4 (four) hours as needed for up to 15 days (pain).    Dispense:  90 tablet    Refill:  0    Order Specific Question:   Supervising Provider    Answer:   JEGEDE, OLUGBEMIGA E [[id number]]     Stephanie Housey Moore Masiel Gentzler  APRN, MSN, FNP-C [facility] [address]  [place], [place] [address] [phone]  

## 2020-11-23 ENCOUNTER — Telehealth: Payer: Self-pay

## 2020-11-23 NOTE — Telephone Encounter (Signed)
Transition Care Management Unsuccessful Follow-up Telephone Call  Date of discharge and from where:  11/22/2020-[facility]   Attempts:  1st Attempt  Reason for unsuccessful TCM follow-up call:  Left voice message

## 2020-11-24 NOTE — Telephone Encounter (Signed)
Transition Care Management Unsuccessful Follow-up Telephone Call  Date of discharge and from where:  11/22/2020 from [facility]  Attempts:  2nd Attempt  Reason for unsuccessful TCM follow-up call:  Left voice message

## 2020-11-26 NOTE — Telephone Encounter (Signed)
Transition Care Management Unsuccessful Follow-up Telephone Call  Date of discharge and from where:  11/22/2020 from [facility]  Attempts:  3rd Attempt  Reason for unsuccessful TCM follow-up call:  Unable to reach patient

## 2020-11-27 ENCOUNTER — Ambulatory Visit: Payer: Self-pay | Admitting: Family Medicine

## 2020-12-03 ENCOUNTER — Other Ambulatory Visit: Payer: Self-pay | Admitting: Family Medicine

## 2020-12-03 DIAGNOSIS — D571 Sickle-cell disease without crisis: Secondary | ICD-10-CM

## 2020-12-03 DIAGNOSIS — M62838 Other muscle spasm: Secondary | ICD-10-CM

## 2020-12-04 ENCOUNTER — Other Ambulatory Visit: Payer: Self-pay

## 2020-12-04 DIAGNOSIS — G894 Chronic pain syndrome: Secondary | ICD-10-CM

## 2020-12-04 DIAGNOSIS — D571 Sickle-cell disease without crisis: Secondary | ICD-10-CM

## 2020-12-04 DIAGNOSIS — F1190 Opioid use, unspecified, uncomplicated: Secondary | ICD-10-CM

## 2020-12-05 ENCOUNTER — Other Ambulatory Visit: Payer: Self-pay

## 2020-12-05 ENCOUNTER — Inpatient Hospital Stay (HOSPITAL_COMMUNITY): Admission: RE | Admit: 2020-12-05 | Payer: Medicare Other | Source: Ambulatory Visit

## 2020-12-05 DIAGNOSIS — F1190 Opioid use, unspecified, uncomplicated: Secondary | ICD-10-CM

## 2020-12-05 DIAGNOSIS — G894 Chronic pain syndrome: Secondary | ICD-10-CM

## 2020-12-05 DIAGNOSIS — D571 Sickle-cell disease without crisis: Secondary | ICD-10-CM

## 2020-12-06 ENCOUNTER — Other Ambulatory Visit: Payer: Self-pay | Admitting: Family Medicine

## 2020-12-06 DIAGNOSIS — G894 Chronic pain syndrome: Secondary | ICD-10-CM

## 2020-12-06 DIAGNOSIS — F1190 Opioid use, unspecified, uncomplicated: Secondary | ICD-10-CM

## 2020-12-06 DIAGNOSIS — D571 Sickle-cell disease without crisis: Secondary | ICD-10-CM

## 2020-12-06 MED ORDER — OXYCODONE HCL 20 MG PO TABS: 20.0000 mg | ORAL_TABLET | ORAL | 0 refills | Status: DC | PRN

## 2020-12-06 NOTE — Progress Notes (Signed)
Reviewed PDMP substance reporting system prior to prescribing opiate medications. No inconsistencies noted.  Meds ordered this encounter  Medications   Oxycodone HCl 20 MG TABS    Sig: Take 1 tablet (20 mg total) by mouth every 4 (four) hours as needed for up to 15 days (pain).    Dispense:  90 tablet    Refill:  0    Order Specific Question:   Supervising Provider    Answer:   Quentin Angst [[id number]]     Nolon Nations  APRN, MSN, FNP-C Patient Care [facility] [address] [place], [place] [address] [phone]

## 2020-12-18 ENCOUNTER — Telehealth (HOSPITAL_COMMUNITY): Payer: Self-pay | Admitting: *Deleted

## 2020-12-18 ENCOUNTER — Inpatient Hospital Stay (HOSPITAL_COMMUNITY)
Admission: EM | Admit: 2020-12-18 | Discharge: 2020-12-24 | DRG: 812 | Disposition: A | Discharge status: Home / Self Care | Payer: Medicare Other | Attending: Internal Medicine | Admitting: Internal Medicine

## 2020-12-18 ENCOUNTER — Encounter (HOSPITAL_COMMUNITY): Payer: Self-pay

## 2020-12-18 DIAGNOSIS — D5700 Hb-SS disease with crisis, unspecified: Secondary | ICD-10-CM | POA: Diagnosis not present

## 2020-12-18 DIAGNOSIS — Z6841 Body Mass Index (BMI) 40.0 and over, adult: Secondary | ICD-10-CM

## 2020-12-18 DIAGNOSIS — Z832 Family history of diseases of the blood and blood-forming organs and certain disorders involving the immune mechanism: Secondary | ICD-10-CM | POA: Diagnosis not present

## 2020-12-18 DIAGNOSIS — Z20822 Contact with and (suspected) exposure to covid-19: Secondary | ICD-10-CM | POA: Diagnosis present

## 2020-12-18 DIAGNOSIS — D571 Sickle-cell disease without crisis: Secondary | ICD-10-CM

## 2020-12-18 DIAGNOSIS — D638 Anemia in other chronic diseases classified elsewhere: Secondary | ICD-10-CM | POA: Diagnosis present

## 2020-12-18 DIAGNOSIS — F1120 Opioid dependence, uncomplicated: Secondary | ICD-10-CM | POA: Diagnosis present

## 2020-12-18 DIAGNOSIS — Z79899 Other long term (current) drug therapy: Secondary | ICD-10-CM

## 2020-12-18 DIAGNOSIS — G894 Chronic pain syndrome: Secondary | ICD-10-CM | POA: Diagnosis present

## 2020-12-18 DIAGNOSIS — Z8249 Family history of ischemic heart disease and other diseases of the circulatory system: Secondary | ICD-10-CM

## 2020-12-18 DIAGNOSIS — F1190 Opioid use, unspecified, uncomplicated: Secondary | ICD-10-CM

## 2020-12-18 DIAGNOSIS — E559 Vitamin D deficiency, unspecified: Secondary | ICD-10-CM | POA: Diagnosis present

## 2020-12-18 DIAGNOSIS — Z833 Family history of diabetes mellitus: Secondary | ICD-10-CM

## 2020-12-18 LAB — COMPREHENSIVE METABOLIC PANEL
ALT: 14 U/L (ref 0–44)
AST: 14 U/L — ABNORMAL LOW (ref 15–41)
Albumin: 4 g/dL (ref 3.5–5.0)
Alkaline Phosphatase: 82 U/L (ref 38–126)
Anion gap: 7 (ref 5–15)
BUN: 10 mg/dL (ref 6–20)
CO2: 24 mmol/L (ref 22–32)
Calcium: 9.3 mg/dL (ref 8.9–10.3)
Chloride: 107 mmol/L (ref 98–111)
Creatinine, Ser: 0.64 mg/dL (ref 0.44–1.00)
GFR, Estimated: >60 mL/min (ref >60)
Glucose, Bld: 105 mg/dL — ABNORMAL HIGH (ref 70–99)
Potassium: 3.8 mmol/L (ref 3.5–5.1)
Sodium: 138 mmol/L (ref 135–145)
Total Bilirubin: 0.7 mg/dL (ref 0.3–1.2)
Total Protein: 8 g/dL (ref 6.5–8.1)

## 2020-12-18 LAB — CBC WITH DIFFERENTIAL/PLATELET
Abs Immature Granulocytes: 0.03 10*3/uL (ref 0.00–0.07)
Basophils Absolute: 0 10*3/uL (ref 0.0–0.1)
Basophils Relative: 0 %
Eosinophils Absolute: 0.1 10*3/uL (ref 0.0–0.5)
Eosinophils Relative: 2 %
HCT: 35.3 % — ABNORMAL LOW (ref 36.0–46.0)
Hemoglobin: 12.2 g/dL (ref 12.0–15.0)
Immature Granulocytes: 0 %
Lymphocytes Relative: 19 %
Lymphs Abs: 1.7 10*3/uL (ref 0.7–4.0)
MCH: 23.6 pg — ABNORMAL LOW (ref 26.0–34.0)
MCHC: 34.6 g/dL (ref 30.0–36.0)
MCV: 68.3 fL — ABNORMAL LOW (ref 80.0–100.0)
Monocytes Absolute: 0.6 10*3/uL (ref 0.1–1.0)
Monocytes Relative: 6 %
Neutro Abs: 6.7 10*3/uL (ref 1.7–7.7)
Neutrophils Relative %: 73 %
Platelets: 304 10*3/uL (ref 150–400)
RBC: 5.17 MIL/uL — ABNORMAL HIGH (ref 3.87–5.11)
RDW: 15.9 % — ABNORMAL HIGH (ref 11.5–15.5)
WBC: 9.2 10*3/uL (ref 4.0–10.5)
nRBC: 0 % (ref 0.0–0.2)

## 2020-12-18 LAB — RESP PANEL BY RT-PCR (FLU A&B, COVID) ARPGX2
Influenza A by PCR: NEGATIVE
Influenza B by PCR: NEGATIVE
SARS Coronavirus 2 by RT PCR: NEGATIVE

## 2020-12-18 LAB — RETICULOCYTES
Immature Retic Fract: 33 % — ABNORMAL HIGH (ref 2.3–15.9)
RBC.: 5.23 MIL/uL — ABNORMAL HIGH (ref 3.87–5.11)
Retic Count, Absolute: 210.2 10*3/uL — ABNORMAL HIGH (ref 19.0–186.0)
Retic Ct Pct: 4 % — ABNORMAL HIGH (ref 0.4–3.1)

## 2020-12-18 LAB — I-STAT BETA HCG BLOOD, ED (MC, WL, AP ONLY): I-stat hCG, quantitative: <5 m[IU]/mL (ref <5)

## 2020-12-18 MED ORDER — GABAPENTIN 300 MG PO CAPS
300.0000 mg | ORAL_CAPSULE | Freq: Three times a day (TID) | ORAL | Status: DC
  Administered 2020-12-18: 300 mg via ORAL
  Filled 2020-12-18 (×8): qty 1

## 2020-12-18 MED ORDER — NALOXONE HCL 0.4 MG/ML IJ SOLN: 0.4000 mg | INTRAMUSCULAR | Status: DC | PRN

## 2020-12-18 MED ORDER — FOLIC ACID 1 MG PO TABS
1.0000 mg | ORAL_TABLET | Freq: Every day | ORAL | Status: DC
  Administered 2020-12-18 – 2020-12-24 (×7): 1 mg via ORAL
  Filled 2020-12-18 (×7): qty 1

## 2020-12-18 MED ORDER — ONDANSETRON HCL 4 MG/2ML IJ SOLN
4.0000 mg | INTRAMUSCULAR | Status: DC | PRN
  Administered 2020-12-18 – 2020-12-19 (×3): 4 mg via INTRAVENOUS
  Filled 2020-12-18 (×4): qty 2

## 2020-12-18 MED ORDER — SODIUM CHLORIDE 0.45 % IV SOLN: INTRAVENOUS | Status: DC

## 2020-12-18 MED ORDER — DIPHENHYDRAMINE HCL 25 MG PO CAPS
25.0000 mg | ORAL_CAPSULE | ORAL | Status: DC | PRN
  Filled 2020-12-18 (×2): qty 1

## 2020-12-18 MED ORDER — KETOROLAC TROMETHAMINE 15 MG/ML IJ SOLN
15.0000 mg | Freq: Four times a day (QID) | INTRAMUSCULAR | Status: AC
  Administered 2020-12-18 – 2020-12-23 (×20): 15 mg via INTRAVENOUS
  Filled 2020-12-18 (×21): qty 1

## 2020-12-18 MED ORDER — OXYCODONE HCL 5 MG PO TABS
20.0000 mg | ORAL_TABLET | ORAL | Status: AC | PRN
  Administered 2020-12-19 (×3): 20 mg via ORAL
  Filled 2020-12-18 (×3): qty 4

## 2020-12-18 MED ORDER — SENNOSIDES-DOCUSATE SODIUM 8.6-50 MG PO TABS
1.0000 | ORAL_TABLET | Freq: Two times a day (BID) | ORAL | Status: DC
  Administered 2020-12-23 – 2020-12-24 (×3): 1 via ORAL
  Filled 2020-12-18 (×9): qty 1

## 2020-12-18 MED ORDER — HYDROMORPHONE HCL 2 MG/ML IJ SOLN
2.0000 mg | INTRAMUSCULAR | Status: AC
  Administered 2020-12-18: 2 mg via INTRAVENOUS
  Filled 2020-12-18: qty 1

## 2020-12-18 MED ORDER — POLYETHYLENE GLYCOL 3350 17 G PO PACK: 17.0000 g | PACK | Freq: Every day | ORAL | Status: DC | PRN

## 2020-12-18 MED ORDER — OXYCODONE HCL ER 10 MG PO T12A: 20.0000 mg | EXTENDED_RELEASE_TABLET | Freq: Two times a day (BID) | ORAL | Status: DC

## 2020-12-18 MED ORDER — OXYCODONE HCL ER 20 MG PO T12A
20.0000 mg | EXTENDED_RELEASE_TABLET | Freq: Two times a day (BID) | ORAL | Status: DC
  Administered 2020-12-18 – 2020-12-24 (×12): 20 mg via ORAL
  Filled 2020-12-18 (×7): qty 1
  Filled 2020-12-18: qty 2
  Filled 2020-12-18 (×5): qty 1

## 2020-12-18 MED ORDER — SODIUM CHLORIDE 0.9% FLUSH: 9.0000 mL | INTRAVENOUS | Status: DC | PRN

## 2020-12-18 MED ORDER — ENOXAPARIN SODIUM 40 MG/0.4ML IJ SOSY
40.0000 mg | PREFILLED_SYRINGE | INTRAMUSCULAR | Status: DC
  Administered 2020-12-18 – 2020-12-23 (×6): 40 mg via SUBCUTANEOUS
  Filled 2020-12-18 (×7): qty 0.4

## 2020-12-18 MED ORDER — DIPHENHYDRAMINE HCL 25 MG PO CAPS
25.0000 mg | ORAL_CAPSULE | ORAL | Status: DC | PRN
  Administered 2020-12-18: 25 mg via ORAL
  Filled 2020-12-18: qty 1

## 2020-12-18 MED ORDER — HYDROMORPHONE HCL 2 MG/ML IJ SOLN
2.0000 mg | INTRAMUSCULAR | Status: DC | PRN
  Administered 2020-12-18 – 2020-12-19 (×6): 2 mg via INTRAVENOUS
  Filled 2020-12-18 (×6): qty 1

## 2020-12-18 MED ORDER — VALACYCLOVIR HCL 500 MG PO TABS
500.0000 mg | ORAL_TABLET | Freq: Every day | ORAL | Status: DC
  Administered 2020-12-18 – 2020-12-24 (×7): 500 mg via ORAL
  Filled 2020-12-18 (×7): qty 1

## 2020-12-18 NOTE — ED Notes (Signed)
Patient given meal tray.

## 2020-12-18 NOTE — ED Notes (Signed)
Patient using bedside commode.

## 2020-12-18 NOTE — ED Notes (Signed)
EDP at the bedside.  ?

## 2020-12-18 NOTE — ED Notes (Signed)
Pt ambulatory to and from bathroom without assistance.  

## 2020-12-18 NOTE — ED Provider Notes (Signed)
[facility]-EMERGENCY DEPT Provider Note   CSN: [id number] Arrival date & time: 12/18/20  0908     History Chief Complaint  Patient presents with   Sickle Cell Pain Crisis    Stephanie Burch is a 40 y.o. female.  40 year old female presents with sickle cell crisis.  Patient states that her pain started today and has been unresponsive to her home medications.  Denies any chest pain or shortness of breath.  Has not had any volume loss.  Pain is localized to her legs and left arm which is where she normally has her discomfort.  Is unsure of what triggered her current symptoms.  Called the patient care center and told to come here      Past Medical History:  Diagnosis Date   HSV infection    Nausea    Sickle cell anemia (HCC)    Vitamin B12 deficiency 12/2018   Vitamin D deficiency     Patient Active Problem List   Diagnosis Date Noted   Sickle cell disease with crisis (HCC) 09/24/2020   Sickle cell anemia with crisis (HCC) 06/12/2020   Pulmonary edema 11/11/2019   Hypoxia    Poor venous access 11/09/2019   HCAP (healthcare-associated pneumonia) 11/07/2019   Sepsis (HCC) 11/07/2019   Acute kidney injury (HCC) 11/07/2019   History of COVID-19 11/07/2019   COVID-19 virus detected 10/22/2019   Sore throat due to virus 10/22/2019   Urinary frequency 04/20/2019   Vaginal odor 04/20/2019   Chest pain varying with breathing    Fever and chills    Vitamin D deficiency 11/15/2018   Nausea 11/15/2018   Abnormal pulse oximetry    Hypokalemia 09/20/2018   Medication management 09/14/2018   Muscle spasms of both lower extremities 08/25/2018   Subjective visual disturbance of right eye 08/25/2018   Hb-SS disease without crisis (HCC) 04/13/2018   Chronic pain syndrome 03/16/2018   Chronic, continuous use of opioids 03/16/2018   Tachycardia with heart rate 100-120 beats per minute    Sickle cell crisis (HCC) 10/19/2017   Leukocytosis 10/19/2017    Thrombocytopenia (HCC) 10/19/2017   Morbid obesity with BMI of 50.0-59.9, adult (HCC) 10/19/2017   Sickle cell pain crisis (HCC) 10/15/2017    History reviewed. No pertinent surgical history.   OB History     Gravida  3   Para      Term      Preterm      AB      Living  3      SAB      IAB      Ectopic      Multiple      Live Births              Family History  Problem Relation Age of Onset   Hypertension Mother    Sickle cell trait Mother    Diabetes Father    Sickle cell trait Father     Social History   Tobacco Use   Smoking status: Never   Smokeless tobacco: Never  Vaping Use   Vaping Use: Never used  Substance Use Topics   Alcohol use: Not Currently   Drug use: Never    Home Medications Prior to Admission medications   Medication Sig Start Date End Date Taking? Authorizing Provider  crizanlizumab-tmca (ADAKVEO) 100 MG/10ML SOLN Inject 680 mg into the vein every 30 (thirty) days. 07/20/20   Massie Maroon, FNP  folic acid (FOLVITE) 1 MG tablet Take  1 tablet (1 mg total) by mouth daily. 11/15/18   Kallie Locks, FNP  gabapentin (NEURONTIN) 300 MG capsule Take 300 mg by mouth 3 (three) times daily.    [provider]  ibuprofen (ADVIL) 800 MG tablet TAKE 1 TABLET BY MOUTH EVERY 8 HOURS AS NEEDED FOR HEADACHE, MILD PAIN, MODERATE PAIN OR CRAMPING 12/04/20   Massie Maroon, FNP  ondansetron (ZOFRAN) 4 MG tablet Take 1 tablet (4 mg total) by mouth every 8 (eight) hours as needed for nausea or vomiting. 11/20/20   Massie Maroon, FNP  oxyCODONE ER [facility] ER) 18 MG C12A Take 18 mg by mouth every 12 (twelve) hours. 11/21/20 12/21/20  Massie Maroon, FNP  Oxycodone HCl 20 MG TABS Take 1 tablet (20 mg total) by mouth every 4 (four) hours as needed for up to 15 days (pain). 12/08/20 12/23/20  Massie Maroon, FNP  tiZANidine (ZANAFLEX) 4 MG capsule Take 4 mg by mouth 3 (three) times daily. 10/07/20   [provider]   valACYclovir (VALTREX) 500 MG tablet Take Valtrex 500 mg daily for suppression of outbreaks. Take Valtrex 1000 mg daily X 1 week for outbreaks. 10/26/20   Massie Maroon, FNP  Vitamin D, Ergocalciferol, (DRISDOL) 1.25 MG (50000 UT) CAPS capsule Take 1 capsule (50,000 Units total) by mouth every 7 (seven) days. Patient not taking: No sig reported 12/21/18   Kallie Locks, FNP    Allergies    Patient has no known allergies.  Review of Systems   Review of Systems  All other systems reviewed and are negative.  Physical Exam Updated Vital Signs BP (!) 158/92 (BP Location: Right Wrist)   Pulse (!) 116   Temp 98.7 F (37.1 C) (Oral)   Resp 16   LMP 11/17/2020 (Approximate)   SpO2 98%   Physical Exam Vitals and nursing note reviewed.  Constitutional:      General: She is not in acute distress.    Appearance: Normal appearance. She is well-developed. She is not toxic-appearing.  HENT:     Head: Normocephalic and atraumatic.  Eyes:     General: Lids are normal.     Conjunctiva/sclera: Conjunctivae normal.     Pupils: Pupils are equal, round, and reactive to light.  Neck:     Thyroid: No thyroid mass.     Trachea: No tracheal deviation.  Cardiovascular:     Rate and Rhythm: Normal rate and regular rhythm.     Heart sounds: Normal heart sounds. No murmur heard.   No gallop.  Pulmonary:     Effort: Pulmonary effort is normal. No respiratory distress.     Breath sounds: Normal breath sounds. No stridor. No decreased breath sounds, wheezing, rhonchi or rales.  Abdominal:     General: There is no distension.     Palpations: Abdomen is soft.     Tenderness: There is no abdominal tenderness. There is no rebound.  Musculoskeletal:        General: No tenderness. Normal range of motion.     Cervical back: Normal range of motion and neck supple.  Skin:    General: Skin is warm and dry.     Findings: No abrasion or rash.  Neurological:     Mental Status: She is alert and  oriented to person, place, and time. Mental status is at baseline.     GCS: GCS eye subscore is 4. GCS verbal subscore is 5. GCS motor subscore is 6.  Cranial Nerves: No cranial nerve deficit.     Sensory: No sensory deficit.     Motor: Motor function is intact.  Psychiatric:        Attention and Perception: Attention normal.        Speech: Speech normal.        Behavior: Behavior normal.    ED Results / Procedures / Treatments   Labs (all labs ordered are listed, but only abnormal results are displayed) Labs Reviewed - No data to display  EKG None  Radiology No results found.  Procedures Procedures   Medications Ordered in ED Medications  0.45 % sodium chloride infusion (has no administration in time range)  HYDROmorphone (DILAUDID) injection 2 mg (has no administration in time range)  HYDROmorphone (DILAUDID) injection 2 mg (has no administration in time range)  diphenhydrAMINE (BENADRYL) capsule 25-50 mg (has no administration in time range)  ondansetron (ZOFRAN) injection 4 mg (has no administration in time range)    ED Course  I have reviewed the triage vital signs and the nursing notes.  Pertinent labs & imaging results that were available during my care of the patient were reviewed by me and considered in my medical decision making (see chart for details).    MDM Rules/Calculators/A&P                           Patient medicated with Dilaudid x2 and continues to have pain.  Discussed with sickle cell team and patient to be admitted Final Clinical Impression(s) / ED Diagnoses Final diagnoses:  None    Rx / DC Orders ED Discharge Orders     None        Lorre Nick, MD 12/18/20 1239

## 2020-12-18 NOTE — Telephone Encounter (Signed)
Patient called requesting to come to the day infusion hospital for sickle cell pain. Patient reports bilateral leg and left arm pain rated 10/10. Reports taking Oxycodone and Xtampza this morning with no relief. COVID-19 screening done and patient denies all symptoms and exposures. Denies fever, chest pain, nausea, vomiting, diarrhea and abdominal pain. [place], FNP notified. The day hospital is at capacity and unable to accept additional patients. Also patient feels that she needs to be admitted to inpatient unit. Patient advised to go the the ED for work-up. Patient expresses an understanding.

## 2020-12-18 NOTE — ED Triage Notes (Signed)
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  C/O bilateral leg pain and left arm pain.  No relief despite taking at home meds.   A/ox4 Ambulatory in triage  10/10 pain

## 2020-12-18 NOTE — H&P (Signed)
H&P  Patient Demographics:  Stephanie Burch, is a 40 y.o. female  MRN: [id number]   DOB - 1980-07-29  Admit Date - 12/18/2020  Outpatient Primary MD for the patient is Massie Maroon, FNP  Chief Complaint  Patient presents with   Sickle Cell Pain Crisis      HPI:   Stephanie Burch  is a 40 y.o. female with a medical record significant for sickle cell disease, chronic pain syndrome, opiate dependence and tolerance, morbid obesity, and history of anemia of chronic disease presents with complaints of low back and lower extremity pain that have been persistent over the past 4 days and unrelieved by her home medications.  Pain is consistent with her typical sickle cell pain crises.  She rates her pain as 10/10 characterized as constant and throbbing.  She attributes current pain crisis to increased stressors at home and changes in either setting she has been taking oxycodone and extended to consistently without sustained relief.  She denies any fever, chills, chest pain, or shortness of breath.  No urinary symptoms, nausea, vomiting, or diarrhea.  No sick contacts, recent travel, or known exposure to COVID-19 infection.  ED plan:  Vital sign show: BP 113/79   Pulse 93   Temp 98.9 F (37.2 C) (Oral)   Resp (!) 7   LMP 11/17/2020 (Approximate)   SpO2 97% . Reviewed all laboratory values, unremarkable. COVID-19, influenza A, and influenza B negative. Pain persists despite IV dilaudid, IV fluids, and IV Toradol. Patient will be admitted to inpatient services for further management of sickle cell pain crisis.     Review of systems:  Review of Systems  Constitutional:  Negative for chills and fever.  Eyes: Negative.   Respiratory: Negative.    Gastrointestinal: Negative.   Genitourinary: Negative.   Musculoskeletal:  Positive for back pain and joint pain.  Skin: Negative.   Neurological: Negative.   Psychiatric/Behavioral: Negative.      With Past History of the following :   Past  Medical History:  Diagnosis Date   HSV infection    Nausea    Sickle cell anemia (HCC)    Vitamin B12 deficiency 12/2018   Vitamin D deficiency       History reviewed. No pertinent surgical history.   Social History:   Social History   Tobacco Use   Smoking status: Never   Smokeless tobacco: Never  Substance Use Topics   Alcohol use: Not Currently     Lives - At home   Family History :   Family History  Problem Relation Age of Onset   Hypertension Mother    Sickle cell trait Mother    Diabetes Father    Sickle cell trait Father      Home Medications:   Prior to Admission medications   Medication Sig Start Date End Date Taking? Authorizing Provider  amoxicillin (AMOXIL) 500 MG capsule Take 500 mg by mouth every 8 (eight) hours. 12/13/20  Yes [provider]  crizanlizumab-tmca (ADAKVEO) 100 MG/10ML SOLN Inject 680 mg into the vein every 30 (thirty) days. 07/20/20  Yes Massie Maroon, FNP  folic acid (FOLVITE) 1 MG tablet Take 1 tablet (1 mg total) by mouth daily. 11/15/18  Yes Kallie Locks, FNP  ibuprofen (ADVIL) 800 MG tablet TAKE 1 TABLET BY MOUTH EVERY 8 HOURS AS NEEDED FOR HEADACHE, MILD PAIN, MODERATE PAIN OR CRAMPING Patient taking differently: Take 800 mg by mouth every 8 (eight) hours as needed for headache or mild  pain. 12/04/20  Yes Massie Maroon, FNP  ondansetron (ZOFRAN) 4 MG tablet Take 1 tablet (4 mg total) by mouth every 8 (eight) hours as needed for nausea or vomiting. 11/20/20  Yes Massie Maroon, FNP  oxyCODONE ER [facility] ER) 18 MG C12A Take 18 mg by mouth every 12 (twelve) hours. 11/21/20 12/21/20 Yes Massie Maroon, FNP  Oxycodone HCl 20 MG TABS Take 1 tablet (20 mg total) by mouth every 4 (four) hours as needed for up to 15 days (pain). 12/08/20 12/23/20 Yes Massie Maroon, FNP  tiZANidine (ZANAFLEX) 4 MG capsule Take 4 mg by mouth 3 (three) times daily. 10/07/20  Yes [provider]  valACYclovir (VALTREX) 500 MG  tablet Take Valtrex 500 mg daily for suppression of outbreaks. Take Valtrex 1000 mg daily X 1 week for outbreaks. 10/26/20  Yes Massie Maroon, FNP  gabapentin (NEURONTIN) 300 MG capsule Take 300 mg by mouth 3 (three) times daily.    [provider]  Vitamin D, Ergocalciferol, (DRISDOL) 1.25 MG (50000 UT) CAPS capsule Take 1 capsule (50,000 Units total) by mouth every 7 (seven) days. Patient not taking: No sig reported 12/21/18   Kallie Locks, FNP     Allergies:   No Known Allergies   Physical Exam:   Vitals:   Vitals:   12/18/20 1245 12/18/20 1345  BP: (!) 127/107 124/77  Pulse: (!) 118 98  Resp: 19 (!) 22  Temp:    SpO2: 99% 99%    Physical Exam: Constitutional: Patient appears well-developed and well-nourished. Not in obvious distress. HENT: Normocephalic, atraumatic, External right and left ear normal. Oropharynx is clear and moist.  Eyes: Conjunctivae and EOM are normal. PERRLA, no scleral icterus. Neck: Normal ROM. Neck supple. No JVD. No tracheal deviation. No thyromegaly. CVS: RRR, S1/S2 +, no murmurs, no gallops, no carotid bruit.  Pulmonary: Effort and breath sounds normal, no stridor, rhonchi, wheezes, rales.  Abdominal: Soft. BS +, no distension, tenderness, rebound or guarding.  Musculoskeletal: Normal range of motion. No edema and no tenderness.  Lymphadenopathy: No lymphadenopathy noted, cervical, inguinal or axillary Neuro: Alert. Normal reflexes, muscle tone coordination. No cranial nerve deficit. Skin: Skin is warm and dry. No rash noted. Not diaphoretic. No erythema. No pallor. Psychiatric: Normal mood and affect. Behavior, judgment, thought content normal.   Data Review:   CBC Recent Labs  Lab 12/18/20 1050  WBC 9.2  HGB 12.2  HCT 35.3*  PLT 304  MCV 68.3*  MCH 23.6*  MCHC 34.6  RDW 15.9*  LYMPHSABS 1.7  MONOABS 0.6  EOSABS 0.1  BASOSABS 0.0    ------------------------------------------------------------------------------------------------------------------  Chemistries  Recent Labs  Lab 12/18/20 1050  NA 138  K 3.8  CL 107  CO2 24  GLUCOSE 105*  BUN 10  CREATININE 0.64  CALCIUM 9.3  AST 14*  ALT 14  ALKPHOS 82  BILITOT 0.7   ------------------------------------------------------------------------------------------------------------------ CrCl cannot be calculated (Unknown ideal weight.). ------------------------------------------------------------------------------------------------------------------ No results for input(s): TSH, T4TOTAL, T3FREE, THYROIDAB in the last 72 hours.  Invalid input(s): FREET3  Coagulation profile No results for input(s): INR, PROTIME in the last 168 hours. ------------------------------------------------------------------------------------------------------------------- No results for input(s): DDIMER in the last 72 hours. -------------------------------------------------------------------------------------------------------------------  Cardiac Enzymes No results for input(s): CKMB, TROPONINI, MYOGLOBIN in the last 168 hours.  Invalid input(s): CK ------------------------------------------------------------------------------------------------------------------ No results found for: BNP  ---------------------------------------------------------------------------------------------------------------  Urinalysis    Component Value Date/Time   COLORURINE YELLOW 06/12/2020 1025   APPEARANCEUR CLEAR 06/12/2020 1025   LABSPEC  1.011 06/12/2020 1025   PHURINE 6.0 06/12/2020 1025   GLUCOSEU NEGATIVE 06/12/2020 1025   HGBUR NEGATIVE 06/12/2020 1025   BILIRUBINUR NEGATIVE 06/12/2020 1025   BILIRUBINUR Negative 04/27/2019 0935   KETONESUR NEGATIVE 06/12/2020 1025   PROTEINUR NEGATIVE 06/12/2020 1025   UROBILINOGEN 1.0 04/27/2019 0935   NITRITE NEGATIVE 06/12/2020 1025    LEUKOCYTESUR NEGATIVE 06/12/2020 1025    ----------------------------------------------------------------------------------------------------------------   Imaging Results:    No results found.   Assessment & Plan:  Principal Problem:   Sickle cell pain crisis (HCC) Active Problems:   Chronic pain syndrome  Sickle cell disease with pain crisis: Admit patient. While in ER, Dilaudid 2 mg IV every 2 hours as needed for breakthrough as needed OxyContin 20 mg every 12 hours Oxycodone 20 mg hours as needed Will initiate IV Dilaudid PCA when room becomes available IV fluids, 0.45% saline at 100 mL/h Vital signs very closely, reevaluate pain scale regularly, and supplemental oxygen as needed.  Anemia of chronic disease: Patient's hemoglobin is within normal limits.  There is no clinical indication for blood transfusion at this time. Continue folic acid  Chronic pain syndrome: Continue home medications  Morbid obesity: Heart healthy diet  DVT Prophylaxis: Subcut Lovenox   AM Labs Ordered, also please review Full Orders  Family Communication: Admission, patient's condition and plan of care including tests being ordered have been discussed with the patient who indicate understanding and agree with the plan and Code Status.  Code Status: Full Code  Consults called: None    Admission status: Inpatient    Time spent in minutes : 50 minutes Nolon Nations  APRN, MSN, FNP-C Patient Care [facility] [address] [place], [place] [address] [phone]  12/18/2020 at 1:58 PM

## 2020-12-19 LAB — CBC
HCT: 33 % — ABNORMAL LOW (ref 36.0–46.0)
Hemoglobin: 11.2 g/dL — ABNORMAL LOW (ref 12.0–15.0)
MCH: 23.8 pg — ABNORMAL LOW (ref 26.0–34.0)
MCHC: 33.9 g/dL (ref 30.0–36.0)
MCV: 70.1 fL — ABNORMAL LOW (ref 80.0–100.0)
Platelets: 227 10*3/uL (ref 150–400)
RBC: 4.71 MIL/uL (ref 3.87–5.11)
RDW: 15.6 % — ABNORMAL HIGH (ref 11.5–15.5)
WBC: 12.2 10*3/uL — ABNORMAL HIGH (ref 4.0–10.5)
nRBC: 0.2 % (ref 0.0–0.2)

## 2020-12-19 MED ORDER — HYDROMORPHONE 1 MG/ML IV SOLN
INTRAVENOUS | Status: DC
  Administered 2020-12-19: 30 mg via INTRAVENOUS
  Administered 2020-12-19: 15.5 mg via INTRAVENOUS
  Administered 2020-12-19: 30 mg via INTRAVENOUS
  Administered 2020-12-19: 7.45 mg via INTRAVENOUS
  Administered 2020-12-19: 9.5 mg via INTRAVENOUS
  Administered 2020-12-20: 9 mg via INTRAVENOUS
  Administered 2020-12-20: 0 mL via INTRAVENOUS
  Administered 2020-12-20: 5.5 mL via INTRAVENOUS
  Administered 2020-12-20: 4 mg via INTRAVENOUS
  Administered 2020-12-20: 30 mg via INTRAVENOUS
  Administered 2020-12-20: 12.5 mg via INTRAVENOUS
  Administered 2020-12-21: 9 mg via INTRAVENOUS
  Administered 2020-12-21: 30 mg via INTRAVENOUS
  Administered 2020-12-21: 8 mg via INTRAVENOUS
  Administered 2020-12-21: 11 mL via INTRAVENOUS
  Administered 2020-12-21: 11.5 mg via INTRAVENOUS
  Administered 2020-12-21: 30 mg via INTRAVENOUS
  Administered 2020-12-21: 32 mg via INTRAVENOUS
  Administered 2020-12-22: 30 mg via INTRAVENOUS
  Administered 2020-12-22: 9 mg via INTRAVENOUS
  Administered 2020-12-22: 9.5 mg via INTRAVENOUS
  Administered 2020-12-22: 30 mg via INTRAVENOUS
  Administered 2020-12-22: 11 mg via INTRAVENOUS
  Administered 2020-12-22: 6.5 mg via INTRAVENOUS
  Administered 2020-12-22: 5.5 mg via INTRAVENOUS
  Administered 2020-12-22: 11 mg via INTRAVENOUS
  Administered 2020-12-23: 30 mg via INTRAVENOUS
  Administered 2020-12-23: 1.5 mg via INTRAVENOUS
  Administered 2020-12-23: 7.5 mg via INTRAVENOUS
  Administered 2020-12-23: 6 mg via INTRAVENOUS
  Administered 2020-12-23: 2.5 mg via INTRAVENOUS
  Administered 2020-12-23: 4.5 mg via INTRAVENOUS
  Administered 2020-12-24: 1.5 mg via INTRAVENOUS
  Administered 2020-12-24: 0.5 mg via INTRAVENOUS
  Administered 2020-12-24: 1 mg via INTRAVENOUS
  Filled 2020-12-19 (×8): qty 30

## 2020-12-19 MED ORDER — AMOXICILLIN 500 MG PO CAPS
500.0000 mg | ORAL_CAPSULE | Freq: Three times a day (TID) | ORAL | Status: AC
  Administered 2020-12-19 – 2020-12-24 (×16): 500 mg via ORAL
  Filled 2020-12-19: qty 1
  Filled 2020-12-19 (×2): qty 2
  Filled 2020-12-19 (×2): qty 1
  Filled 2020-12-19: qty 2
  Filled 2020-12-19: qty 1
  Filled 2020-12-19: qty 2
  Filled 2020-12-19 (×2): qty 1
  Filled 2020-12-19: qty 2
  Filled 2020-12-19: qty 1
  Filled 2020-12-19: qty 2
  Filled 2020-12-19: qty 1
  Filled 2020-12-19 (×4): qty 2
  Filled 2020-12-19 (×4): qty 1
  Filled 2020-12-19: qty 2
  Filled 2020-12-19: qty 1
  Filled 2020-12-19: qty 2
  Filled 2020-12-19: qty 1
  Filled 2020-12-19: qty 2
  Filled 2020-12-19: qty 1
  Filled 2020-12-19 (×3): qty 2
  Filled 2020-12-19: qty 1

## 2020-12-20 ENCOUNTER — Ambulatory Visit: Payer: Self-pay | Admitting: Family Medicine

## 2020-12-20 ENCOUNTER — Other Ambulatory Visit: Payer: Self-pay

## 2020-12-20 DIAGNOSIS — D5700 Hb-SS disease with crisis, unspecified: Secondary | ICD-10-CM | POA: Diagnosis not present

## 2020-12-20 LAB — CBC
HCT: 31.5 % — ABNORMAL LOW (ref 36.0–46.0)
Hemoglobin: 10.7 g/dL — ABNORMAL LOW (ref 12.0–15.0)
MCH: 23.7 pg — ABNORMAL LOW (ref 26.0–34.0)
MCHC: 34 g/dL (ref 30.0–36.0)
MCV: 69.7 fL — ABNORMAL LOW (ref 80.0–100.0)
Platelets: 146 10*3/uL — ABNORMAL LOW (ref 150–400)
RBC: 4.52 MIL/uL (ref 3.87–5.11)
RDW: 16.1 % — ABNORMAL HIGH (ref 11.5–15.5)
WBC: 14.1 10*3/uL — ABNORMAL HIGH (ref 4.0–10.5)
nRBC: 0.7 % — ABNORMAL HIGH (ref 0.0–0.2)

## 2020-12-20 MED ORDER — OXYCODONE HCL 5 MG PO TABS
10.0000 mg | ORAL_TABLET | Freq: Once | ORAL | Status: AC
  Administered 2020-12-20: 04:00:00 10 mg via ORAL
  Filled 2020-12-20: qty 2

## 2020-12-20 MED ORDER — OXYCODONE HCL 5 MG PO TABS
20.0000 mg | ORAL_TABLET | ORAL | Status: DC | PRN
  Administered 2020-12-20 – 2020-12-24 (×20): 20 mg via ORAL
  Filled 2020-12-20 (×20): qty 4

## 2020-12-20 MED ORDER — SODIUM CHLORIDE 0.9% FLUSH: 10.0000 mL | INTRAVENOUS | Status: DC | PRN

## 2020-12-20 NOTE — Plan of Care (Signed)
  Problem: Pain Managment: Goal: General experience of comfort will improve Outcome: Progressing   

## 2020-12-20 NOTE — Progress Notes (Signed)
Subjective: Stephanie Burch is a 40 year old female with a medical history significant for sickle cell disease, chronic pain syndrome, opiate dependence and tolerance, and morbid obesity that was admitted for sickle cell pain crisis. Patient states that pain intensity is not improving very much on current medication regimen.  Pain is primarily to lower extremities characterized as constant and throbbing.  She rates her pain as 8/10.  She denies any headache, chest pain, shortness of breath, urinary symptoms, nausea, vomiting, or diarrhea. Objective:  Vital signs in last 24 hours:  Vitals:   12/20/20 0355 12/20/20 0426 12/20/20 0931 12/20/20 1214  BP:  137/78 101/61   Pulse:  88 92   Resp: 13 18 13 13   Temp:  97.8 F (36.6 C) 98.5 F (36.9 C)   TempSrc:  Oral Oral   SpO2: 97% 99% 98% 98%  Weight:      Height:        Intake/Output from previous day:   Intake/Output Summary (Last 24 hours) at 12/20/2020 1231 Last data filed at 12/20/2020 0826 Gross per 24 hour  Intake 1358.61 ml  Output --  Net 1358.61 ml    Physical Exam: General: Alert, awake, oriented x3, in no acute distress.  Morbid obesity. HEENT: [place]/AT PEERL, EOMI Neck: Trachea midline,  no masses, no thyromegal,y no JVD, no carotid bruit OROPHARYNX:  Moist, No exudate/ erythema/lesions.  Heart: Regular rate and rhythm, without murmurs, rubs, gallops, PMI non-displaced, no heaves or thrills on palpation.  Lungs: Clear to auscultation, no wheezing or rhonchi noted. No increased vocal fremitus resonant to percussion  Abdomen: Soft, nontender, nondistended, positive bowel sounds, no masses no hepatosplenomegaly noted..  Neuro: No focal neurological deficits noted cranial nerves II through XII grossly intact. DTRs 2+ bilaterally upper and lower extremities. Strength 5 out of 5 in bilateral upper and lower extremities. Musculoskeletal: No warm swelling or erythema around joints, no spinal tenderness noted. Psychiatric: Patient  alert and oriented x3, good insight and cognition, good recent to remote recall. Lymph node survey: No cervical axillary or inguinal lymphadenopathy noted.  Lab Results:  Basic Metabolic Panel:    Component Value Date/Time   NA 138 12/18/2020 1050   NA 142 09/11/2017 1431   K 3.8 12/18/2020 1050   CL 107 12/18/2020 1050   CO2 24 12/18/2020 1050   BUN 10 12/18/2020 1050   BUN 8 09/11/2017 1431   CREATININE 0.64 12/18/2020 1050   GLUCOSE 105 (H) 12/18/2020 1050   CALCIUM 9.3 12/18/2020 1050   CBC:    Component Value Date/Time   WBC 14.1 (H) 12/20/2020 0520   HGB 10.7 (L) 12/20/2020 0520   HGB 13.0 09/11/2017 1431   HCT 31.5 (L) 12/20/2020 0520   HCT 38.4 09/11/2017 1431   PLT 146 (L) 12/20/2020 0520   PLT 258 09/11/2017 1431   MCV 69.7 (L) 12/20/2020 0520   MCV 75 (L) 09/11/2017 1431   NEUTROABS 6.7 12/18/2020 1050   NEUTROABS 3.9 09/11/2017 1431   LYMPHSABS 1.7 12/18/2020 1050   LYMPHSABS 2.2 09/11/2017 1431   MONOABS 0.6 12/18/2020 1050   EOSABS 0.1 12/18/2020 1050   EOSABS 0.1 09/11/2017 1431   BASOSABS 0.0 12/18/2020 1050   BASOSABS 0.0 09/11/2017 1431    Recent Results (from the past 240 hour(s))  Resp Panel by RT-PCR (Flu A&B, Covid) Nasopharyngeal Swab     Status: None   Collection Time: 12/18/20 12:55 PM   Specimen: Nasopharyngeal Swab; Nasopharyngeal(NP) swabs in vial transport medium  Result Value Ref  Range Status   SARS Coronavirus 2 by RT PCR NEGATIVE NEGATIVE Final    Comment: (NOTE) SARS-CoV-2 target nucleic acids are NOT DETECTED.  The SARS-CoV-2 RNA is generally detectable in upper respiratory specimens during the acute phase of infection. The lowest concentration of SARS-CoV-2 viral copies this assay can detect is 138 copies/mL. A negative result does not preclude SARS-Cov-2 infection and should not be used as the sole basis for treatment or other patient management decisions. A negative result may occur with  improper specimen  collection/handling, submission of specimen other than nasopharyngeal swab, presence of viral mutation(s) within the areas targeted by this assay, and inadequate number of viral copies(<138 copies/mL). A negative result must be combined with clinical observations, patient history, and epidemiological information. The expected result is Negative.  Fact Sheet for Patients:  BloggerCourse.com  Fact Sheet for Healthcare Providers:  SeriousBroker.it  This test is no t yet approved or cleared by the [place] FDA and  has been authorized for detection and/or diagnosis of SARS-CoV-2 by FDA under an Emergency Use Authorization (EUA). This EUA will remain  in effect (meaning this test can be used) for the duration of the COVID-19 declaration under Section 564(b)(1) of the Act, 21 U.S.C.section 360bbb-3(b)(1), unless the authorization is terminated  or revoked sooner.       Influenza A by PCR NEGATIVE NEGATIVE Final   Influenza B by PCR NEGATIVE NEGATIVE Final    Comment: (NOTE) The Xpert Xpress SARS-CoV-2/FLU/RSV plus assay is intended as an aid in the diagnosis of influenza from Nasopharyngeal swab specimens and should not be used as a sole basis for treatment. Nasal washings and aspirates are unacceptable for Xpert Xpress SARS-CoV-2/FLU/RSV testing.  Fact Sheet for Patients: BloggerCourse.com  Fact Sheet for Healthcare Providers: SeriousBroker.it  This test is not yet approved or cleared by the [place] FDA and has been authorized for detection and/or diagnosis of SARS-CoV-2 by FDA under an Emergency Use Authorization (EUA). This EUA will remain in effect (meaning this test can be used) for the duration of the COVID-19 declaration under Section 564(b)(1) of the Act, 21 U.S.C. section 360bbb-3(b)(1), unless the authorization is terminated or revoked.  Performed at [facility] [address], [place], [place] [address]     Studies/Results: No results found.  Medications: Scheduled Meds:  amoxicillin  500 mg Oral Q8H   enoxaparin (LOVENOX) injection  40 mg Subcutaneous Q24H   folic acid  1 mg Oral Daily   gabapentin  300 mg Oral TID   HYDROmorphone   Intravenous Q4H   ketorolac  15 mg Intravenous Q6H   oxyCODONE  20 mg Oral Q12H   senna-docusate  1 tablet Oral BID   valACYclovir  500 mg Oral Daily   Continuous Infusions:  sodium chloride Stopped (12/19/20 1701)   PRN Meds:.diphenhydrAMINE, naloxone **AND** sodium chloride flush, ondansetron, oxyCODONE, polyethylene glycol  Consultants: none  Procedures: none  Antibiotics: none  Assessment/Plan: Principal Problem:   Sickle cell pain crisis (HCC) Active Problems:   Chronic pain syndrome  Sickle cell disease with pain crisis: Continue IV Dilaudid PCA with no changes in settings Oxycodone 20 mg every 4 hours as needed for severe breakthrough pain OxyContin 20 mg every 12 hours Toradol 15 mg IV every 6 hours for total of 5 days Monitor vital signs very closely, reevaluate pain scale regularly, and supplemental oxygen as needed  Chronic pain syndrome: Continue home medication regimen  Anemia of chronic disease: Patient's hemoglobin is stable and  consistent with her baseline.  There is no clinical indication for blood transfusion at this time.  Morbid obesity: Patient counseled at length on the importance of following a low-fat, low carbohydrate diet.  We will continue heart healthy diet  Code Status: Full Code Family Communication: N/A Disposition Plan: Not yet ready for discharge  Shelia Magallon Rennis Petty  APRN, MSN, FNP-C Patient Care Center [facility] [address] [place], [place] [address] [phone]  If 5PM-8AM, please contact night-coverage.  12/20/2020, 12:31 PM  LOS: 2 days

## 2020-12-21 DIAGNOSIS — D5700 Hb-SS disease with crisis, unspecified: Secondary | ICD-10-CM | POA: Diagnosis not present

## 2020-12-21 NOTE — Care Management Important Message (Signed)
Important Message  Patient Details IM Letter given to the Patient. Name: Stephanie Burch MRN: [id number] Date of Birth: September 22, 1980   Medicare Important Message Given:  Yes     Caren Macadam 12/21/2020, 11:56 AM

## 2020-12-21 NOTE — Progress Notes (Signed)
Subjective: Stephanie Burch is a 40 year old female with a medical history significant for sickle cell disease, chronic pain syndrome, opiate dependence and tolerance, and history of morbid obesity was admitted for sickle cell pain crisis. Patient states that pain intensity has not improved very much overnight.  Patient has poor venous access and lost her IV overnight.  Had a difficult time restarting.  She states at that time, pain intensity increased and has not improved.  She rates her pain at 8/10.  She denies headache, chest pain, shortness of breath, urinary symptoms, nausea, vomiting, or diarrhea.  Objective:  Vital signs in last 24 hours:  Vitals:   12/21/20 0429 12/21/20 0745 12/21/20 0839 12/21/20 1338  BP: 134/73  140/82 139/86  Pulse: 90  84 83  Resp: 15 15 13 15   Temp: 98.2 F (36.8 C)  98.2 F (36.8 C) 98.6 F (37 C)  TempSrc: Oral  Oral Oral  SpO2: 98% 98% 92% 97%  Weight:      Height:        Intake/Output from previous day:   Intake/Output Summary (Last 24 hours) at 12/21/2020 1622 Last data filed at 12/21/2020 0600 Gross per 24 hour  Intake 330 ml  Output --  Net 330 ml    Physical Exam: General: Alert, awake, oriented x3, in no acute distress.  HEENT: [place]/AT PEERL, EOMI Neck: Trachea midline,  no masses, no thyromegal,y no JVD, no carotid bruit OROPHARYNX:  Moist, No exudate/ erythema/lesions.  Heart: Regular rate and rhythm, without murmurs, rubs, gallops, PMI non-displaced, no heaves or thrills on palpation.  Lungs: Clear to auscultation, no wheezing or rhonchi noted. No increased vocal fremitus resonant to percussion  Abdomen: Soft, nontender, nondistended, positive bowel sounds, no masses no hepatosplenomegaly noted..  Neuro: No focal neurological deficits noted cranial nerves II through XII grossly intact. DTRs 2+ bilaterally upper and lower extremities. Strength 5 out of 5 in bilateral upper and lower extremities. Musculoskeletal: No warm swelling or  erythema around joints, no spinal tenderness noted. Psychiatric: Patient alert and oriented x3, good insight and cognition, good recent to remote recall. Lymph node survey: No cervical axillary or inguinal lymphadenopathy noted.  Lab Results:  Basic Metabolic Panel:    Component Value Date/Time   NA 138 12/18/2020 1050   NA 142 09/11/2017 1431   K 3.8 12/18/2020 1050   CL 107 12/18/2020 1050   CO2 24 12/18/2020 1050   BUN 10 12/18/2020 1050   BUN 8 09/11/2017 1431   CREATININE 0.64 12/18/2020 1050   GLUCOSE 105 (H) 12/18/2020 1050   CALCIUM 9.3 12/18/2020 1050   CBC:    Component Value Date/Time   WBC 14.1 (H) 12/20/2020 0520   HGB 10.7 (L) 12/20/2020 0520   HGB 13.0 09/11/2017 1431   HCT 31.5 (L) 12/20/2020 0520   HCT 38.4 09/11/2017 1431   PLT 146 (L) 12/20/2020 0520   PLT 258 09/11/2017 1431   MCV 69.7 (L) 12/20/2020 0520   MCV 75 (L) 09/11/2017 1431   NEUTROABS 6.7 12/18/2020 1050   NEUTROABS 3.9 09/11/2017 1431   LYMPHSABS 1.7 12/18/2020 1050   LYMPHSABS 2.2 09/11/2017 1431   MONOABS 0.6 12/18/2020 1050   EOSABS 0.1 12/18/2020 1050   EOSABS 0.1 09/11/2017 1431   BASOSABS 0.0 12/18/2020 1050   BASOSABS 0.0 09/11/2017 1431    Recent Results (from the past 240 hour(s))  Resp Panel by RT-PCR (Flu A&B, Covid) Nasopharyngeal Swab     Status: None   Collection Time: 12/18/20 12:55 PM  Specimen: Nasopharyngeal Swab; Nasopharyngeal(NP) swabs in vial transport medium  Result Value Ref Range Status   SARS Coronavirus 2 by RT PCR NEGATIVE NEGATIVE Final    Comment: (NOTE) SARS-CoV-2 target nucleic acids are NOT DETECTED.  The SARS-CoV-2 RNA is generally detectable in upper respiratory specimens during the acute phase of infection. The lowest concentration of SARS-CoV-2 viral copies this assay can detect is 138 copies/mL. A negative result does not preclude SARS-Cov-2 infection and should not be used as the sole basis for treatment or other patient management  decisions. A negative result may occur with  improper specimen collection/handling, submission of specimen other than nasopharyngeal swab, presence of viral mutation(s) within the areas targeted by this assay, and inadequate number of viral copies(<138 copies/mL). A negative result must be combined with clinical observations, patient history, and epidemiological information. The expected result is Negative.  Fact Sheet for Patients:  BloggerCourse.com  Fact Sheet for Healthcare Providers:  SeriousBroker.it  This test is no t yet approved or cleared by the [place] FDA and  has been authorized for detection and/or diagnosis of SARS-CoV-2 by FDA under an Emergency Use Authorization (EUA). This EUA will remain  in effect (meaning this test can be used) for the duration of the COVID-19 declaration under Section 564(b)(1) of the Act, 21 U.S.C.section 360bbb-3(b)(1), unless the authorization is terminated  or revoked sooner.       Influenza A by PCR NEGATIVE NEGATIVE Final   Influenza B by PCR NEGATIVE NEGATIVE Final    Comment: (NOTE) The Xpert Xpress SARS-CoV-2/FLU/RSV plus assay is intended as an aid in the diagnosis of influenza from Nasopharyngeal swab specimens and should not be used as a sole basis for treatment. Nasal washings and aspirates are unacceptable for Xpert Xpress SARS-CoV-2/FLU/RSV testing.  Fact Sheet for Patients: BloggerCourse.com  Fact Sheet for Healthcare Providers: SeriousBroker.it  This test is not yet approved or cleared by the [place] FDA and has been authorized for detection and/or diagnosis of SARS-CoV-2 by FDA under an Emergency Use Authorization (EUA). This EUA will remain in effect (meaning this test can be used) for the duration of the COVID-19 declaration under Section 564(b)(1) of the Act, 21 U.S.C. section 360bbb-3(b)(1), unless the  authorization is terminated or revoked.  Performed at [facility], [address] [place], [place] [address]     Studies/Results: No results found.  Medications: Scheduled Meds:  amoxicillin  500 mg Oral Q8H   enoxaparin (LOVENOX) injection  40 mg Subcutaneous Q24H   folic acid  1 mg Oral Daily   gabapentin  300 mg Oral TID   HYDROmorphone   Intravenous Q4H   ketorolac  15 mg Intravenous Q6H   oxyCODONE  20 mg Oral Q12H   senna-docusate  1 tablet Oral BID   valACYclovir  500 mg Oral Daily   Continuous Infusions:  sodium chloride 10 mL/hr at 12/20/20 1310   PRN Meds:.diphenhydrAMINE, naloxone **AND** sodium chloride flush, ondansetron, oxyCODONE, polyethylene glycol, sodium chloride flush  Consultants: none  Procedures: none  Antibiotics: none  Assessment/Plan: Principal Problem:   Sickle cell pain crisis (HCC) Active Problems:   Chronic pain syndrome  Sickle cell disease with pain crisis: Continue IV Dilaudid PCA without changes today Oxycodone 20 mg every 4 hours as needed for severe breakthrough pain Continue OxyContin 20 mg every 12 hours Toradol 15 mg IV every 6 hours for total of 5 days Monitor vital signs very closely, reevaluate pain scale regularly, and supplemental oxygen as needed Chronic  pain syndrome: Continue home medication regimen  Anemia of chronic disease: Patient's hemoglobin is stable and consistent with her baseline there is no clinical indication for blood transfusion at this time.  Morbid obesity: Patient counseled at length on the importance of following a low-fat, low carbohydrate diet.  Continue heart healthy diet  Code Status: Full Code Family Communication: N/A Disposition Plan: Not yet ready for discharge  Andreyah Natividad Rennis Petty  APRN, MSN, FNP-C [facility] [address] [place], [place] [address] [phone]  If 7PM-7AM, please contact  night-coverage.  12/21/2020, 4:22 PM  LOS: 3 days

## 2020-12-22 NOTE — Progress Notes (Signed)
Subjective: A 40 year old female with history of sickle cell disease, chronic pain syndrome, opiate dependence, tolerant as well as morbid obesity who was admitted with sickle cell crisis.  Patient is still complaining of 7 out of 10 pain.  It was 8 out of 10 apparently yesterday.  Only slight change.  Denied any nausea vomiting or diarrhea.  She is on the Dilaudid PCA and has used 43 mg with 89 demands and 86 deliveries in the last 24 hours.  Objective: Vital signs in last 24 hours: Temp:  [97.4 F (36.3 C)-98.6 F (37 C)] 97.6 F (36.4 C) (11/19 0617) Pulse Rate:  [78-106] 106 (11/19 0617) Resp:  [13-18] 14 (11/19 0711) BP: (121-154)/(75-97) 154/97 (11/19 0617) SpO2:  [92 %-100 %] 98 % (11/19 0617) Weight change:  Last BM Date: 12/19/20 (per patient)  Intake/Output from previous day: No intake/output data recorded. Intake/Output this shift: No intake/output data recorded.  General appearance: alert, cooperative, no distress, and moderately obese Neck: no adenopathy, no carotid bruit, no JVD, supple, symmetrical, trachea midline, and thyroid not enlarged, symmetric, no tenderness/mass/nodules Back: symmetric, no curvature. ROM normal. No CVA tenderness. Resp: clear to auscultation bilaterally Cardio: regular rate and rhythm, S1, S2 normal, no murmur, click, rub or gallop GI: soft, non-tender; bowel sounds normal; no masses,  no organomegaly Extremities: extremities normal, atraumatic, no cyanosis or edema  Lab Results: Recent Labs    12/20/20 0520  WBC 14.1*  HGB 10.7*  HCT 31.5*  PLT 146*   BMET No results for input(s): NA, K, CL, CO2, GLUCOSE, BUN, CREATININE, CALCIUM in the last 72 hours.  Studies/Results: No results found.  Medications: I have reviewed the patient's current medications.  Assessment/Plan: 40 year old female here with sickle cell painful crisis.  #1 sickle cell painful crisis: Patient will be maintained on her current Dilaudid regimen.  Also  Toradol and IV fluids for 1 more day.  Goal is to try and discharge patient hopefully tomorrow.  Counseled patient on the need to move around and mobilize herself in preparation for possible discharge.  Continue with OxyContin and oxycodone.  #2 anemia of chronic disease: Continue to monitor H&H.  It appears to be at baseline.  #3 chronic pain syndrome: Continue with OxyContin from home.  #4 morbid obesity: Continue dietary counseling.    LOS: 4 days   Jaydee Conran,LAWAL 12/22/2020, 7:45 AM

## 2020-12-23 LAB — COMPREHENSIVE METABOLIC PANEL
ALT: 49 U/L — ABNORMAL HIGH (ref 0–44)
AST: 27 U/L (ref 15–41)
Albumin: 3.4 g/dL — ABNORMAL LOW (ref 3.5–5.0)
Alkaline Phosphatase: 73 U/L (ref 38–126)
Anion gap: 6 (ref 5–15)
BUN: 10 mg/dL (ref 6–20)
CO2: 25 mmol/L (ref 22–32)
Calcium: 8.7 mg/dL — ABNORMAL LOW (ref 8.9–10.3)
Chloride: 109 mmol/L (ref 98–111)
Creatinine, Ser: 0.59 mg/dL (ref 0.44–1.00)
GFR, Estimated: >60 mL/min (ref >60)
Glucose, Bld: 106 mg/dL — ABNORMAL HIGH (ref 70–99)
Potassium: 3.8 mmol/L (ref 3.5–5.1)
Sodium: 140 mmol/L (ref 135–145)
Total Bilirubin: 0.9 mg/dL (ref 0.3–1.2)
Total Protein: 6.7 g/dL (ref 6.5–8.1)

## 2020-12-23 LAB — CBC WITH DIFFERENTIAL/PLATELET
Abs Immature Granulocytes: 0.04 10*3/uL (ref 0.00–0.07)
Basophils Absolute: 0.1 10*3/uL (ref 0.0–0.1)
Basophils Relative: 1 %
Eosinophils Absolute: 1.1 10*3/uL — ABNORMAL HIGH (ref 0.0–0.5)
Eosinophils Relative: 11 %
HCT: 28.7 % — ABNORMAL LOW (ref 36.0–46.0)
Hemoglobin: 9.8 g/dL — ABNORMAL LOW (ref 12.0–15.0)
Immature Granulocytes: 0 %
Lymphocytes Relative: 27 %
Lymphs Abs: 2.8 10*3/uL (ref 0.7–4.0)
MCH: 23.7 pg — ABNORMAL LOW (ref 26.0–34.0)
MCHC: 34.1 g/dL (ref 30.0–36.0)
MCV: 69.3 fL — ABNORMAL LOW (ref 80.0–100.0)
Monocytes Absolute: 0.9 10*3/uL (ref 0.1–1.0)
Monocytes Relative: 9 %
Neutro Abs: 5.3 10*3/uL (ref 1.7–7.7)
Neutrophils Relative %: 52 %
Platelets: 270 10*3/uL (ref 150–400)
RBC: 4.14 MIL/uL (ref 3.87–5.11)
RDW: 15.9 % — ABNORMAL HIGH (ref 11.5–15.5)
WBC: 10.3 10*3/uL (ref 4.0–10.5)
nRBC: 0.8 % — ABNORMAL HIGH (ref 0.0–0.2)

## 2020-12-23 NOTE — Progress Notes (Signed)
Subjective: Patient has come much better.  Was getting ready for discharge today but pain apparently shot up.  No change in therapy.  Patient likely to be discharged tomorrow when she can pick up her prescription.  I have offered to give her a prescription today but her pharmacy apparently was closed.  She otherwise has no new complaint.  Objective: Vital signs in last 24 hours: Temp:  [97.5 F (36.4 C)-98.5 F (36.9 C)] 98 F (36.7 C) (11/20 1755) Pulse Rate:  [84-99] 91 (11/20 1755) Resp:  [14-20] 16 (11/20 1755) BP: (135-146)/(66-89) 142/83 (11/20 1755) SpO2:  [97 %-100 %] 100 % (11/20 1755) Weight change:  Last BM Date: 12/19/20  Intake/Output from previous day: No intake/output data recorded. Intake/Output this shift: No intake/output data recorded.  General appearance: alert, cooperative, no distress, and moderately obese Neck: no adenopathy, no carotid bruit, no JVD, supple, symmetrical, trachea midline, and thyroid not enlarged, symmetric, no tenderness/mass/nodules Back: symmetric, no curvature. ROM normal. No CVA tenderness. Resp: clear to auscultation bilaterally Cardio: regular rate and rhythm, S1, S2 normal, no murmur, click, rub or gallop GI: soft, non-tender; bowel sounds normal; no masses,  no organomegaly Extremities: extremities normal, atraumatic, no cyanosis or edema  Lab Results: Recent Labs    12/23/20 1315  WBC 10.3  HGB 9.8*  HCT 28.7*  PLT 270    BMET Recent Labs    12/23/20 1315  NA 140  K 3.8  CL 109  CO2 25  GLUCOSE 106*  BUN 10  CREATININE 0.59  CALCIUM 8.7*    Studies/Results: No results found.  Medications: I have reviewed the patient's current medications.  Assessment/Plan: 40 year old female here with sickle cell painful crisis.  #1 sickle cell painful crisis: Patient will be maintained on her current regimen overnight.  If she improved she will be discharged home in the morning.  Also continue Toradol and IV fluids for 1  more day.  Goal is to try and discharge patient hopefully tomorrow.    #2 anemia of chronic disease: Continue to monitor H&H.  It appears to be at baseline.  #3 chronic pain syndrome: Continue with OxyContin from home.  #4 morbid obesity: Continue dietary counseling.    LOS: 5 days   Stephanie Burch,LAWAL 12/23/2020, 7:24 PM

## 2020-12-24 ENCOUNTER — Other Ambulatory Visit: Payer: Self-pay | Admitting: Family Medicine

## 2020-12-24 DIAGNOSIS — D5700 Hb-SS disease with crisis, unspecified: Secondary | ICD-10-CM | POA: Diagnosis not present

## 2020-12-24 MED ORDER — OXYCODONE HCL 20 MG PO TABS: 20.0000 mg | ORAL_TABLET | ORAL | 0 refills | Status: DC | PRN

## 2020-12-24 MED ORDER — XTAMPZA ER 18 MG PO C12A: 18.0000 mg | EXTENDED_RELEASE_CAPSULE | Freq: Two times a day (BID) | ORAL | 0 refills | Status: DC

## 2020-12-24 NOTE — Progress Notes (Signed)
Patient discharging home. Vital signs stable at time of discharge as reflected in discharge summary. Discharge instructions given and verbal understanding returned. Patient has follow up appointment scheduled. No questions at this time.

## 2020-12-24 NOTE — Care Management Important Message (Signed)
Important Message  Patient Details IM Letter given to the Patient. Name: Stephanie Burch MRN: [id number] Date of Birth: [birth year]   Medicare Important Message Given:  Yes     Caren Macadam 12/24/2020, 10:37 AM

## 2020-12-24 NOTE — Discharge Summary (Signed)
Physician Discharge Summary  Stephanie Burch TDS:[id number] DOB: 1980-06-12 DOA: 12/18/2020  PCP: Massie Maroon, FNP  Admit date: 12/18/2020  Discharge date: 12/24/2020  Discharge Diagnoses:  Principal Problem:   Sickle cell pain crisis (HCC) Active Problems:   Chronic pain syndrome   Discharge Condition: Stable  Disposition:   Follow-up Information     Massie Maroon, FNP Follow up in 1 week(s).   Specialty: Family Medicine Contact information: [address] [place] [address] [phone]                Pt is discharged home in good condition and is to follow up with Massie Maroon, FNP this week to have labs evaluated. Stephanie Burch is instructed to increase activity slowly and balance with rest for the next few days, and use prescribed medication to complete treatment of pain  Diet: Regular Wt Readings from Last 3 Encounters:  12/19/20 (!) 140.9 kg  10/09/20 136.1 kg  09/22/20 (!) 143.2 kg    History of present illness:  Stephanie Burch  is a 40 y.o. female with a medical record significant for sickle cell disease, chronic pain syndrome, opiate dependence and tolerance, morbid obesity, and history of anemia of chronic disease presents with complaints of low back and lower extremity pain that have been persistent over the past 4 days and unrelieved by her home medications.  Pain is consistent with her typical sickle cell pain crises.  She rates her pain as 10/10 characterized as constant and throbbing.  She attributes current pain crisis to increased stressors at home and changes in either setting she has been taking oxycodone and extended to consistently without sustained relief.  She denies any fever, chills, chest pain, or shortness of breath.  No urinary symptoms, nausea, vomiting, or diarrhea.  No sick contacts, recent travel, or known exposure to COVID-19 infection.   ED plan:  Vital sign show: BP 113/79   Pulse 93   Temp 98.9 F (37.2  C) (Oral)   Resp (!) 7   LMP 11/17/2020 (Approximate)   SpO2 97% . Reviewed all laboratory values, unremarkable. COVID-19, influenza A, and influenza B negative. Pain persists despite IV dilaudid, IV fluids, and IV Toradol. Patient will be admitted to inpatient services for further management of sickle cell pain crisis.   Hospital Course:  Sickle cell disease with pain crisis:  Patient was admitted for sickle cell pain crisis and managed appropriately with IVF, IV Dilaudid via PCA and IV Toradol, as well as other adjunct therapies per sickle cell pain management protocols. Pain intensity is slowly responding to medication regimen.  Patient rates her pain as 5/10 and states that she can manage at home on current medication regimen.  PDMP substance reporting system reviewed prior to prescribing opiate medications, no inconsistencies noted.  Patient will follow-up in 1 week as scheduled for medication management. Patient will resume all home medications. She is alert, oriented, without assistance.  Patient will discharge home in hemodynamically stable condition.  Stephanie Burch will follow-up with PCP within 1 week of this discharge. Stephanie Burch was counseled extensively about nonpharmacologic means of pain management, patient verbalized understanding and was appreciative of  the care received during this admission.   We discussed the need for good hydration, monitoring of hydration status, avoidance of heat, cold, stress, and infection triggers. We discussed the need to be adherent with taking Hydrea and other home medications. Patient was reminded of the need to seek medical attention immediately if any symptom  of bleeding, anemia, or infection occurs.  Discharge Exam: Vitals:   12/24/20 0936 12/24/20 1120  BP: 138/74   Pulse: 97   Resp: 18 18  Temp: 98.6 F (37 C)   SpO2: 99% 99%   Vitals:   12/24/20 0302 12/24/20 0805 12/24/20 0936 12/24/20 1120  BP: 124/73  138/74   Pulse: 89  97   Resp: 17 17  18 18   Temp: 98 F (36.7 C)  98.6 F (37 C)   TempSrc: Oral  Oral   SpO2: 100% 100% 99% 99%  Weight:      Height:        General appearance : Awake, alert, not in any distress. Speech Clear. Not toxic looking HEENT: Atraumatic and Normocephalic, pupils equally reactive to light and accomodation Neck: Supple, no JVD. No cervical lymphadenopathy.  Chest: Good air entry bilaterally, no added sounds  CVS: S1 S2 regular, no murmurs.  Abdomen: Bowel sounds present, Non tender and not distended with no gaurding, rigidity or rebound. Extremities: B/L Lower Ext shows no edema, both legs are warm to touch Neurology: Awake alert, and oriented X 3, CN II-XII intact, Non focal Skin: No Rash  Discharge Instructions  Discharge Instructions     Discharge patient   Complete by: As directed    Discharge disposition: 01-Home or Self Care   Discharge patient date: 12/24/2020      Allergies as of 12/24/2020   No Known Allergies      Medication List     TAKE these medications    amoxicillin 500 MG capsule Commonly known as: AMOXIL Take 500 mg by mouth every 8 (eight) hours.   crizanlizumab-tmca 100 MG/10ML Soln Commonly known as: ADAKVEO Inject 680 mg into the vein every 30 (thirty) days.   folic acid 1 MG tablet Commonly known as: FOLVITE Take 1 tablet (1 mg total) by mouth daily.   gabapentin 300 MG capsule Commonly known as: NEURONTIN Take 300 mg by mouth 3 (three) times daily.   ibuprofen 800 MG tablet Commonly known as: ADVIL TAKE 1 TABLET BY MOUTH EVERY 8 HOURS AS NEEDED FOR HEADACHE, MILD PAIN, MODERATE PAIN OR CRAMPING What changed: See the new instructions.   Oxycodone HCl 20 MG Tabs Take 1 tablet (20 mg total) by mouth every 4 (four) hours as needed for up to 15 days (pain).   tiZANidine 4 MG capsule Commonly known as: ZANAFLEX Take 4 mg by mouth 3 (three) times daily.   valACYclovir 500 MG tablet Commonly known as: Valtrex Take Valtrex 500 mg daily for  suppression of outbreaks. Take Valtrex 1000 mg daily X 1 week for outbreaks.   Vitamin D (Ergocalciferol) 1.25 MG (50000 UNIT) Caps capsule Commonly known as: DRISDOL Take 1 capsule (50,000 Units total) by mouth every 7 (seven) days.   Xtampza ER 18 MG C12a Generic drug: oxyCODONE ER Take 18 mg by mouth every 12 (twelve) hours.        The results of significant diagnostics from this hospitalization (including imaging, microbiology, ancillary and laboratory) are listed below for reference.    Significant Diagnostic Studies: No results found.  Microbiology: Recent Results (from the past 240 hour(s))  Resp Panel by RT-PCR (Flu A&B, Covid) Nasopharyngeal Swab     Status: None   Collection Time: 12/18/20 12:55 PM   Specimen: Nasopharyngeal Swab; Nasopharyngeal(NP) swabs in vial transport medium  Result Value Ref Range Status   SARS Coronavirus 2 by RT PCR NEGATIVE NEGATIVE Final    Comment: (NOTE) SARS-CoV-2  target nucleic acids are NOT DETECTED.  The SARS-CoV-2 RNA is generally detectable in upper respiratory specimens during the acute phase of infection. The lowest concentration of SARS-CoV-2 viral copies this assay can detect is 138 copies/mL. A negative result does not preclude SARS-Cov-2 infection and should not be used as the sole basis for treatment or other patient management decisions. A negative result may occur with  improper specimen collection/handling, submission of specimen other than nasopharyngeal swab, presence of viral mutation(s) within the areas targeted by this assay, and inadequate number of viral copies(<138 copies/mL). A negative result must be combined with clinical observations, patient history, and epidemiological information. The expected result is Negative.  Fact Sheet for Patients:  BloggerCourse.com  Fact Sheet for Healthcare Providers:  SeriousBroker.it  This test is no t yet approved or  cleared by the [place] FDA and  has been authorized for detection and/or diagnosis of SARS-CoV-2 by FDA under an Emergency Use Authorization (EUA). This EUA will remain  in effect (meaning this test can be used) for the duration of the COVID-19 declaration under Section 564(b)(1) of the Act, 21 U.S.C.section 360bbb-3(b)(1), unless the authorization is terminated  or revoked sooner.       Influenza A by PCR NEGATIVE NEGATIVE Final   Influenza B by PCR NEGATIVE NEGATIVE Final    Comment: (NOTE) The Xpert Xpress SARS-CoV-2/FLU/RSV plus assay is intended as an aid in the diagnosis of influenza from Nasopharyngeal swab specimens and should not be used as a sole basis for treatment. Nasal washings and aspirates are unacceptable for Xpert Xpress SARS-CoV-2/FLU/RSV testing.  Fact Sheet for Patients: BloggerCourse.com  Fact Sheet for Healthcare Providers: SeriousBroker.it  This test is not yet approved or cleared by the [place] FDA and has been authorized for detection and/or diagnosis of SARS-CoV-2 by FDA under an Emergency Use Authorization (EUA). This EUA will remain in effect (meaning this test can be used) for the duration of the COVID-19 declaration under Section 564(b)(1) of the Act, 21 U.S.C. section 360bbb-3(b)(1), unless the authorization is terminated or revoked.  Performed at [facility], [address], [place], [place] [address]      Labs: Basic Metabolic Panel: Recent Labs  Lab 12/23/20 1315  NA 140  K 3.8  CL 109  CO2 25  GLUCOSE 106*  BUN 10  CREATININE 0.59  CALCIUM 8.7*   Liver Function Tests: Recent Labs  Lab 12/23/20 1315  AST 27  ALT 49*  ALKPHOS 73  BILITOT 0.9  PROT 6.7  ALBUMIN 3.4*   No results for input(s): LIPASE, AMYLASE in the last 168 hours. No results for input(s): AMMONIA in the last 168 hours. CBC: Recent Labs  Lab 12/23/20 1315  WBC 10.3   NEUTROABS 5.3  HGB 9.8*  HCT 28.7*  MCV 69.3*  PLT 270   Cardiac Enzymes: No results for input(s): CKTOTAL, CKMB, CKMBINDEX, TROPONINI in the last 168 hours. BNP: Invalid input(s): POCBNP CBG: No results for input(s): GLUCAP in the last 168 hours.  Time coordinating discharge: 30 minutes  Signed:  Nolon Nations  APRN, MSN, FNP-C Patient Care [facility] [address] [place], [place] [address] [phone]  [facility] 12/27/2020, 5:53 PM

## 2020-12-25 ENCOUNTER — Telehealth: Payer: Self-pay

## 2020-12-25 NOTE — Telephone Encounter (Signed)
Transition Care Management Unsuccessful Follow-up Telephone Call  Date of discharge and from where:  12/24/2020 from [facility]  Attempts:  1st Attempt  Reason for unsuccessful TCM follow-up call:  Left voice message

## 2020-12-26 NOTE — Telephone Encounter (Signed)
Transition Care Management Unsuccessful Follow-up Telephone Call  Date of discharge and from where:  12/24/2020 from [facility]  Attempts:  2nd Attempt  Reason for unsuccessful TCM follow-up call:  Left voice message   3

## 2020-12-28 NOTE — Telephone Encounter (Signed)
Transition Care Management Unsuccessful Follow-up Telephone Call  Date of discharge and from where:  12/24/2020-[facility]   Attempts:  3rd Attempt  Reason for unsuccessful TCM follow-up call:  Left voice message

## 2021-01-01 ENCOUNTER — Non-Acute Institutional Stay (HOSPITAL_COMMUNITY): Admit: 2021-01-01 | Payer: Medicare Other

## 2021-01-01 ENCOUNTER — Ambulatory Visit: Payer: Self-pay | Admitting: Family Medicine

## 2021-01-04 ENCOUNTER — Telehealth: Payer: Self-pay

## 2021-01-04 ENCOUNTER — Other Ambulatory Visit: Payer: Self-pay | Admitting: Family Medicine

## 2021-01-04 DIAGNOSIS — F1190 Opioid use, unspecified, uncomplicated: Secondary | ICD-10-CM

## 2021-01-04 DIAGNOSIS — G894 Chronic pain syndrome: Secondary | ICD-10-CM

## 2021-01-04 DIAGNOSIS — D571 Sickle-cell disease without crisis: Secondary | ICD-10-CM

## 2021-01-04 MED ORDER — OXYCODONE HCL 20 MG PO TABS: 20.0000 mg | ORAL_TABLET | ORAL | 0 refills | Status: DC | PRN

## 2021-01-04 NOTE — Progress Notes (Signed)
Reviewed PDMP substance reporting system prior to prescribing opiate medications. No inconsistencies noted.  Meds ordered this encounter  Medications   Oxycodone HCl 20 MG TABS    Sig: Take 1 tablet (20 mg total) by mouth every 4 (four) hours as needed for up to 15 days (pain).    Dispense:  90 tablet    Refill:  0    Order Specific Question:   Supervising Provider    Answer:   Quentin Angst [[id number]]     Nolon Nations  APRN, MSN, FNP-C Patient Care [facility] [address] [place], [place] [address] [phone]

## 2021-01-04 NOTE — Telephone Encounter (Signed)
Oxycodone  °

## 2021-01-16 ENCOUNTER — Inpatient Hospital Stay (HOSPITAL_COMMUNITY): Admission: RE | Admit: 2021-01-16 | Payer: Medicare Other | Source: Ambulatory Visit

## 2021-01-18 ENCOUNTER — Other Ambulatory Visit: Payer: Self-pay | Admitting: Family Medicine

## 2021-01-18 ENCOUNTER — Telehealth: Payer: Self-pay

## 2021-01-18 DIAGNOSIS — F1190 Opioid use, unspecified, uncomplicated: Secondary | ICD-10-CM

## 2021-01-18 DIAGNOSIS — D571 Sickle-cell disease without crisis: Secondary | ICD-10-CM

## 2021-01-18 DIAGNOSIS — G894 Chronic pain syndrome: Secondary | ICD-10-CM

## 2021-01-18 MED ORDER — XTAMPZA ER 18 MG PO C12A: 18.0000 mg | EXTENDED_RELEASE_CAPSULE | Freq: Two times a day (BID) | ORAL | 0 refills | Status: DC

## 2021-01-18 MED ORDER — OXYCODONE HCL 20 MG PO TABS: 20.0000 mg | ORAL_TABLET | ORAL | 0 refills | Status: DC | PRN

## 2021-01-18 NOTE — Telephone Encounter (Signed)
Oxycodone  Xtampza 

## 2021-01-18 NOTE — Progress Notes (Signed)
Reviewed PDMP substance reporting system prior to prescribing opiate medications. No inconsistencies noted.  Meds ordered this encounter  Medications   oxyCODONE ER (XTAMPZA ER) 18 MG C12A    Sig: Take 18 mg by mouth every 12 (twelve) hours.    Dispense:  60 capsule    Refill:  0    Order Specific Question:   Supervising Provider    Answer:   Quentin Angst [[id number]]   Oxycodone HCl 20 MG TABS    Sig: Take 1 tablet (20 mg total) by mouth every 4 (four) hours as needed for up to 15 days (pain).    Dispense:  90 tablet    Refill:  0    Order Specific Question:   Supervising Provider    Answer:   Quentin Angst [[id number]]   Nolon Nations  APRN, MSN, FNP-C Patient Care [facility] [address] [place], [place] [address] [phone]

## 2021-01-21 ENCOUNTER — Telehealth: Payer: Self-pay

## 2021-01-21 NOTE — Telephone Encounter (Signed)
Transition Care Management Unsuccessful Follow-up Telephone Call  Date of discharge and from where:  01/19/2021 from [facility]   Attempts:  1st Attempt  Reason for unsuccessful TCM follow-up call:  Left voice message

## 2021-01-22 NOTE — Telephone Encounter (Signed)
Transition Care Management Unsuccessful Follow-up Telephone Call  Date of discharge and from where:  01/19/2021 from [facility]  Attempts:  2nd Attempt  Reason for unsuccessful TCM follow-up call:  Left voice message

## 2021-01-23 NOTE — Telephone Encounter (Signed)
Transition Care Management Unsuccessful Follow-up Telephone Call  Date of discharge and from where:  01/19/2021 from [facility]  Attempts:  3rd Attempt  Reason for unsuccessful TCM follow-up call:  Unable to reach patient

## 2021-02-01 ENCOUNTER — Other Ambulatory Visit: Payer: Self-pay | Admitting: Family Medicine

## 2021-02-01 DIAGNOSIS — G894 Chronic pain syndrome: Secondary | ICD-10-CM

## 2021-02-01 DIAGNOSIS — F1190 Opioid use, unspecified, uncomplicated: Secondary | ICD-10-CM

## 2021-02-01 DIAGNOSIS — D571 Sickle-cell disease without crisis: Secondary | ICD-10-CM

## 2021-02-01 MED ORDER — OXYCODONE HCL 20 MG PO TABS: 20.0000 mg | ORAL_TABLET | ORAL | 0 refills | Status: DC | PRN

## 2021-02-01 NOTE — Progress Notes (Signed)
Reviewed PDMP substance reporting system prior to prescribing opiate medications. No inconsistencies noted.  Meds ordered this encounter  Medications   Oxycodone HCl 20 MG TABS    Sig: Take 1 tablet (20 mg total) by mouth every 4 (four) hours as needed for up to 15 days (pain).    Dispense:  90 tablet    Refill:  0    Order Specific Question:   Supervising Provider    Answer:   JEGEDE, OLUGBEMIGA E [[id number]]     Stephanie Burch Stephanie Aleman  APRN, MSN, FNP-C Patient Care [facility] [address]  , [place] [address] [phone]  

## 2021-02-05 ENCOUNTER — Ambulatory Visit: Payer: Self-pay | Admitting: Family Medicine

## 2021-02-12 ENCOUNTER — Other Ambulatory Visit: Payer: Self-pay

## 2021-02-12 ENCOUNTER — Encounter (HOSPITAL_COMMUNITY): Payer: Self-pay

## 2021-02-12 ENCOUNTER — Inpatient Hospital Stay (HOSPITAL_COMMUNITY)
Admission: EM | Admit: 2021-02-12 | Discharge: 2021-02-15 | DRG: 812 | Disposition: A | Discharge status: Home / Self Care | Payer: Medicare Other | Attending: Internal Medicine | Admitting: Internal Medicine

## 2021-02-12 DIAGNOSIS — F1120 Opioid dependence, uncomplicated: Secondary | ICD-10-CM | POA: Diagnosis present

## 2021-02-12 DIAGNOSIS — Z20822 Contact with and (suspected) exposure to covid-19: Secondary | ICD-10-CM | POA: Diagnosis present

## 2021-02-12 DIAGNOSIS — Z832 Family history of diseases of the blood and blood-forming organs and certain disorders involving the immune mechanism: Secondary | ICD-10-CM | POA: Diagnosis not present

## 2021-02-12 DIAGNOSIS — D5700 Hb-SS disease with crisis, unspecified: Secondary | ICD-10-CM | POA: Diagnosis present

## 2021-02-12 DIAGNOSIS — D72829 Elevated white blood cell count, unspecified: Secondary | ICD-10-CM | POA: Diagnosis present

## 2021-02-12 DIAGNOSIS — Z833 Family history of diabetes mellitus: Secondary | ICD-10-CM

## 2021-02-12 DIAGNOSIS — Z8249 Family history of ischemic heart disease and other diseases of the circulatory system: Secondary | ICD-10-CM

## 2021-02-12 DIAGNOSIS — D638 Anemia in other chronic diseases classified elsewhere: Secondary | ICD-10-CM | POA: Diagnosis present

## 2021-02-12 DIAGNOSIS — G894 Chronic pain syndrome: Secondary | ICD-10-CM | POA: Diagnosis present

## 2021-02-12 DIAGNOSIS — Z6841 Body Mass Index (BMI) 40.0 and over, adult: Secondary | ICD-10-CM

## 2021-02-12 LAB — CBC WITH DIFFERENTIAL/PLATELET
Abs Immature Granulocytes: 0.07 10*3/uL (ref 0.00–0.07)
Basophils Absolute: 0 10*3/uL (ref 0.0–0.1)
Basophils Relative: 0 %
Eosinophils Absolute: 0.2 10*3/uL (ref 0.0–0.5)
Eosinophils Relative: 2 %
HCT: 33.4 % — ABNORMAL LOW (ref 36.0–46.0)
Hemoglobin: 11.5 g/dL — ABNORMAL LOW (ref 12.0–15.0)
Immature Granulocytes: 1 %
Lymphocytes Relative: 25 %
Lymphs Abs: 2.9 10*3/uL (ref 0.7–4.0)
MCH: 23.4 pg — ABNORMAL LOW (ref 26.0–34.0)
MCHC: 34.4 g/dL (ref 30.0–36.0)
MCV: 68 fL — ABNORMAL LOW (ref 80.0–100.0)
Monocytes Absolute: 0.7 10*3/uL (ref 0.1–1.0)
Monocytes Relative: 6 %
Neutro Abs: 7.7 10*3/uL (ref 1.7–7.7)
Neutrophils Relative %: 66 %
Platelets: 315 10*3/uL (ref 150–400)
RBC: 4.91 MIL/uL (ref 3.87–5.11)
RDW: 17.9 % — ABNORMAL HIGH (ref 11.5–15.5)
WBC: 11.6 10*3/uL — ABNORMAL HIGH (ref 4.0–10.5)
nRBC: 0.8 % — ABNORMAL HIGH (ref 0.0–0.2)

## 2021-02-12 LAB — COMPREHENSIVE METABOLIC PANEL
ALT: 15 U/L (ref 0–44)
AST: 13 U/L — ABNORMAL LOW (ref 15–41)
Albumin: 3.6 g/dL (ref 3.5–5.0)
Alkaline Phosphatase: 69 U/L (ref 38–126)
Anion gap: 8 (ref 5–15)
BUN: 8 mg/dL (ref 6–20)
CO2: 23 mmol/L (ref 22–32)
Calcium: 9 mg/dL (ref 8.9–10.3)
Chloride: 109 mmol/L (ref 98–111)
Creatinine, Ser: 0.62 mg/dL (ref 0.44–1.00)
GFR, Estimated: >60 mL/min (ref >60)
Glucose, Bld: 111 mg/dL — ABNORMAL HIGH (ref 70–99)
Potassium: 3.5 mmol/L (ref 3.5–5.1)
Sodium: 140 mmol/L (ref 135–145)
Total Bilirubin: 0.8 mg/dL (ref 0.3–1.2)
Total Protein: 7.2 g/dL (ref 6.5–8.1)

## 2021-02-12 LAB — RESP PANEL BY RT-PCR (FLU A&B, COVID) ARPGX2
Influenza A by PCR: NEGATIVE
Influenza B by PCR: NEGATIVE
SARS Coronavirus 2 by RT PCR: NEGATIVE

## 2021-02-12 LAB — RETICULOCYTES
Immature Retic Fract: 44.1 % — ABNORMAL HIGH (ref 2.3–15.9)
RBC.: 4.84 MIL/uL (ref 3.87–5.11)
Retic Count, Absolute: 245.9 10*3/uL — ABNORMAL HIGH (ref 19.0–186.0)
Retic Ct Pct: 5.1 % — ABNORMAL HIGH (ref 0.4–3.1)

## 2021-02-12 LAB — I-STAT BETA HCG BLOOD, ED (MC, WL, AP ONLY): I-stat hCG, quantitative: <5 m[IU]/mL (ref <5)

## 2021-02-12 MED ORDER — HYDROMORPHONE HCL 1 MG/ML IJ SOLN
1.0000 mg | INTRAMUSCULAR | Status: AC | PRN
  Administered 2021-02-12 (×3): 1 mg via INTRAVENOUS
  Filled 2021-02-12 (×3): qty 1

## 2021-02-12 MED ORDER — KETOROLAC TROMETHAMINE 15 MG/ML IJ SOLN
15.0000 mg | Freq: Four times a day (QID) | INTRAMUSCULAR | Status: DC
  Administered 2021-02-12 – 2021-02-15 (×12): 15 mg via INTRAVENOUS
  Filled 2021-02-12 (×12): qty 1

## 2021-02-12 MED ORDER — TIZANIDINE HCL 4 MG PO CAPS: 4.0000 mg | ORAL_CAPSULE | Freq: Three times a day (TID) | ORAL | Status: DC

## 2021-02-12 MED ORDER — HYDROMORPHONE 1 MG/ML IV SOLN
INTRAVENOUS | Status: DC
  Administered 2021-02-12: 30 mg via INTRAVENOUS
  Administered 2021-02-13: 5.5 mg via INTRAVENOUS
  Administered 2021-02-13: 10 mg via INTRAVENOUS
  Administered 2021-02-13: 5.5 mg via INTRAVENOUS
  Filled 2021-02-12: qty 30

## 2021-02-12 MED ORDER — DIPHENHYDRAMINE HCL 50 MG/ML IJ SOLN
25.0000 mg | Freq: Once | INTRAMUSCULAR | Status: AC
  Administered 2021-02-12: 25 mg via INTRAVENOUS
  Filled 2021-02-12: qty 1

## 2021-02-12 MED ORDER — SODIUM CHLORIDE 0.45 % IV SOLN: INTRAVENOUS | Status: DC

## 2021-02-12 MED ORDER — SENNOSIDES-DOCUSATE SODIUM 8.6-50 MG PO TABS
1.0000 | ORAL_TABLET | Freq: Two times a day (BID) | ORAL | Status: DC
  Administered 2021-02-13 – 2021-02-15 (×3): 1 via ORAL
  Filled 2021-02-12 (×5): qty 1

## 2021-02-12 MED ORDER — ENOXAPARIN SODIUM 40 MG/0.4ML IJ SOSY
40.0000 mg | PREFILLED_SYRINGE | INTRAMUSCULAR | Status: DC
  Administered 2021-02-12 – 2021-02-14 (×3): 40 mg via SUBCUTANEOUS
  Filled 2021-02-12 (×3): qty 0.4

## 2021-02-12 MED ORDER — HYDROMORPHONE HCL 1 MG/ML IJ SOLN
1.0000 mg | INTRAMUSCULAR | Status: DC | PRN
  Administered 2021-02-12: 1 mg via INTRAVENOUS
  Filled 2021-02-12: qty 1

## 2021-02-12 MED ORDER — GABAPENTIN 300 MG PO CAPS
300.0000 mg | ORAL_CAPSULE | Freq: Three times a day (TID) | ORAL | Status: DC
  Filled 2021-02-12: qty 1

## 2021-02-12 MED ORDER — POLYETHYLENE GLYCOL 3350 17 G PO PACK: 17.0000 g | PACK | Freq: Every day | ORAL | Status: DC | PRN

## 2021-02-12 MED ORDER — OXYCODONE HCL 5 MG PO TABS
20.0000 mg | ORAL_TABLET | ORAL | Status: DC | PRN
  Administered 2021-02-12 – 2021-02-15 (×14): 20 mg via ORAL
  Filled 2021-02-12 (×14): qty 4

## 2021-02-12 MED ORDER — OXYCODONE HCL ER 20 MG PO T12A
20.0000 mg | EXTENDED_RELEASE_TABLET | Freq: Two times a day (BID) | ORAL | Status: DC
  Administered 2021-02-12 – 2021-02-15 (×6): 20 mg via ORAL
  Filled 2021-02-12 (×3): qty 1
  Filled 2021-02-12: qty 2
  Filled 2021-02-12: qty 1
  Filled 2021-02-12: qty 2

## 2021-02-12 MED ORDER — SODIUM CHLORIDE 0.9 % IV SOLN
25.0000 mg | INTRAVENOUS | Status: DC | PRN
  Filled 2021-02-12: qty 0.5

## 2021-02-12 MED ORDER — HYDROMORPHONE HCL 2 MG/ML IJ SOLN
2.0000 mg | INTRAMUSCULAR | Status: AC
  Administered 2021-02-12: 2 mg via INTRAVENOUS
  Filled 2021-02-12: qty 1

## 2021-02-12 MED ORDER — FOLIC ACID 1 MG PO TABS
1.0000 mg | ORAL_TABLET | Freq: Every day | ORAL | Status: DC
  Administered 2021-02-12 – 2021-02-15 (×4): 1 mg via ORAL
  Filled 2021-02-12 (×4): qty 1

## 2021-02-12 MED ORDER — SODIUM CHLORIDE 0.9% FLUSH: 9.0000 mL | INTRAVENOUS | Status: DC | PRN

## 2021-02-12 MED ORDER — ONDANSETRON HCL 4 MG/2ML IJ SOLN
4.0000 mg | INTRAMUSCULAR | Status: DC | PRN
  Administered 2021-02-12: 4 mg via INTRAVENOUS
  Filled 2021-02-12: qty 2

## 2021-02-12 MED ORDER — NALOXONE HCL 0.4 MG/ML IJ SOLN: 0.4000 mg | INTRAMUSCULAR | Status: DC | PRN

## 2021-02-12 MED ORDER — DIPHENHYDRAMINE HCL 25 MG PO CAPS: 25.0000 mg | ORAL_CAPSULE | ORAL | Status: DC | PRN

## 2021-02-12 MED ORDER — KETOROLAC TROMETHAMINE 15 MG/ML IJ SOLN
15.0000 mg | INTRAMUSCULAR | Status: AC
  Administered 2021-02-12: 15 mg via INTRAVENOUS
  Filled 2021-02-12: qty 1

## 2021-02-12 MED ORDER — HYDROMORPHONE HCL 1 MG/ML IJ SOLN: 0.5000 mg | INTRAMUSCULAR | Status: DC | PRN

## 2021-02-12 MED ORDER — ONDANSETRON HCL 4 MG/2ML IJ SOLN
4.0000 mg | Freq: Four times a day (QID) | INTRAMUSCULAR | Status: DC | PRN
  Administered 2021-02-13 (×3): 4 mg via INTRAVENOUS
  Filled 2021-02-12 (×3): qty 2

## 2021-02-12 NOTE — H&P (Signed)
H&P  Patient Demographics:  Stephanie Burch, is a 41 y.o. female  MRN: [id number]   DOB - Mar 27, 1980  Admit Date - 02/12/2021  Outpatient Primary MD for the patient is Dorena Dew, FNP  Chief Complaint  Patient presents with   Sickle Cell Pain Crisis     HPI:   Stephanie Burch  is a 41 y.o. female with a medical history significant for sickle cell disease, chronic pain syndrome, opiate dependence and tolerance, morbid obesity and anemia of chronic disease who presented to the emergency room this morning with major complaints of low back and lower extremity pain that is typical of her sickle cell pain crisis.  She has had this pain for few days, she took her home pain medications with no sustained relief.  She rates her pain at 10/10, characterized as throbbing and achy.  She attributes this pain to change in weather and some increased stressors at home.  She denies any fever, chills, cough, chest pain, shortness of breath, nausea, vomiting or diarrhea.  She has no urinary symptoms.  She denies any sick contacts, recent travels or known exposure to COVID-19.  ED course  Vital signs showed: BP (!) 142/84    Pulse 85    Temp 98.2 F (36.8 C) (Oral)    Resp (!) 22    Ht 5\' 5"  (1.651 m)    Wt (!) 138.3 kg    LMP 01/20/2021 (Approximate)    SpO2 95%    BMI 50.75 kg/m.  Mildly elevated [place] cell count 11.6, hemoglobin at baseline of 11.5, normal platelet at 315.  Comprehensive metabolic panel is unremarkable.  Patient received multiple doses of IV Dilaudid and IV Toradol in the emergency room with no significant relief of her pain.  Patient will be admitted to [place] unit for further evaluation and management of sickle cell pain crisis.   Review of systems:  In addition to the HPI above, patient reports No fever or chills No Headache, No changes with vision or hearing No problems swallowing food or liquids No chest pain, cough or shortness of as throbbing and achy No abdominal pain, No nausea  or vomiting, Bowel movements are regular No blood in stool or urine No dysuria No new skin rashes or bruises No new weakness, tingling, numbness in any extremity No recent weight gain or loss No polyuria, polydypsia or polyphagia No significant Mental Stressors  A full 10 point Review of Systems was done, except as stated above, all other Review of Systems were negative.  With Past History of the following :   Past Medical History:  Diagnosis Date   HSV infection    Nausea    Sickle cell anemia ([place])    Vitamin B12 deficiency 12/2018   Vitamin D deficiency       History reviewed. No pertinent surgical history.   Social History:   Social History   Tobacco Use   Smoking status: Never   Smokeless tobacco: Never  Substance Use Topics   Alcohol use: Not Currently     Lives - At home   Family History :   Family History  Problem Relation Age of Onset   Hypertension Mother    Sickle cell trait Mother    Diabetes Father    Sickle cell trait Father      Home Medications:   Prior to Admission medications   Medication Sig Start Date End Date Taking? Authorizing Provider  crizanlizumab-tmca (ADAKVEO) 100 MG/10ML SOLN Inject 680 mg into  the vein every 30 (thirty) days. 07/20/20   Dorena Dew, FNP  folic acid (FOLVITE) 1 MG tablet Take 1 tablet (1 mg total) by mouth daily. 11/15/18   Azzie Glatter, FNP  gabapentin (NEURONTIN) 300 MG capsule Take 300 mg by mouth 3 (three) times daily.    [provider]  ibuprofen (ADVIL) 800 MG tablet TAKE 1 TABLET BY MOUTH EVERY 8 HOURS AS NEEDED FOR HEADACHE, MILD PAIN, MODERATE PAIN OR CRAMPING Patient taking differently: Take 800 mg by mouth every 8 (eight) hours as needed for headache or mild pain. 12/04/20   Dorena Dew, FNP  ondansetron (ZOFRAN) 4 MG tablet TAKE 1 TABLET BY MOUTH EVERY 8 HOURS AS NEEDED FOR NAUSEA AND VOMITING 12/24/20   Dorena Dew, FNP  oxyCODONE ER [facility] ER) 18 MG C12A Take 18 mg by  mouth every 12 (twelve) hours. 01/23/21 02/22/21  Dorena Dew, FNP  Oxycodone HCl 20 MG TABS Take 1 tablet (20 mg total) by mouth every 4 (four) hours as needed for up to 15 days (pain). 02/04/21 02/19/21  Dorena Dew, FNP  tiZANidine (ZANAFLEX) 4 MG capsule Take 4 mg by mouth 3 (three) times daily. 10/07/20   [provider]  valACYclovir (VALTREX) 500 MG tablet Take Valtrex 500 mg daily for suppression of outbreaks. Take Valtrex 1000 mg daily X 1 week for outbreaks. 10/26/20   Dorena Dew, FNP  Vitamin D, Ergocalciferol, (DRISDOL) 1.25 MG (50000 UT) CAPS capsule Take 1 capsule (50,000 Units total) by mouth every 7 (seven) days. Patient not taking: No sig reported 12/21/18   Azzie Glatter, FNP     Allergies:   No Known Allergies   Physical Exam:   Vitals:   Vitals:   02/12/21 1015 02/12/21 1118  BP: (!) 136/94 (!) 142/84  Pulse: 88 85  Resp: 16 (!) 22  Temp:    SpO2: 100% 95%    Physical Exam: Constitutional: Patient appears well-developed and well-nourished. Not in obvious distress.  Morbidly obese HENT: Normocephalic, atraumatic, External right and left ear normal. Oropharynx is clear and moist.  Eyes: Conjunctivae and EOM are normal. PERRLA, no scleral icterus. Neck: Normal ROM. Neck supple. No JVD. No tracheal deviation. No thyromegaly. CVS: RRR, S1/S2 +, no murmurs, no gallops, no carotid bruit.  Pulmonary: Effort and breath sounds normal, no stridor, rhonchi, wheezes, rales.  Abdominal: Soft. BS +, no distension, tenderness, rebound or guarding.  Musculoskeletal: Normal range of motion. No edema and no tenderness.  Lymphadenopathy: No lymphadenopathy noted, cervical, inguinal or axillary Neuro: Alert. Normal reflexes, muscle tone coordination. No cranial nerve deficit. Skin: Skin is warm and dry. No rash noted. Not diaphoretic. No erythema. No pallor. Psychiatric: Normal mood and affect. Behavior, judgment, thought content normal.   Data Review:    CBC Recent Labs  Lab 02/12/21 0935  WBC 11.6*  HGB 11.5*  HCT 33.4*  PLT 315  MCV 68.0*  MCH 23.4*  MCHC 34.4  RDW 17.9*  LYMPHSABS 2.9  MONOABS 0.7  EOSABS 0.2  BASOSABS 0.0   ------------------------------------------------------------------------------------------------------------------  Chemistries  Recent Labs  Lab 02/12/21 0935  NA 140  K 3.5  CL 109  CO2 23  GLUCOSE 111*  BUN 8  CREATININE 0.62  CALCIUM 9.0  AST 13*  ALT 15  ALKPHOS 69  BILITOT 0.8   ------------------------------------------------------------------------------------------------------------------ estimated creatinine clearance is 134.7 mL/min (by C-G formula based on SCr of 0.62 mg/dL). ------------------------------------------------------------------------------------------------------------------ No results for input(s): TSH, T4TOTAL, T3FREE, THYROIDAB  in the last 72 hours.  Invalid input(s): FREET3  Coagulation profile No results for input(s): INR, PROTIME in the last 168 hours. ------------------------------------------------------------------------------------------------------------------- No results for input(s): DDIMER in the last 72 hours. -------------------------------------------------------------------------------------------------------------------  Cardiac Enzymes No results for input(s): CKMB, TROPONINI, MYOGLOBIN in the last 168 hours.  Invalid input(s): CK ------------------------------------------------------------------------------------------------------------------ No results found for: BNP  ---------------------------------------------------------------------------------------------------------------  Urinalysis    Component Value Date/Time   COLORURINE YELLOW 06/12/2020 [place] 06/12/2020 1025   LABSPEC 1.011 06/12/2020 1025   PHURINE 6.0 06/12/2020 1025   GLUCOSEU NEGATIVE 06/12/2020 1025   [place] 06/12/2020 [place] 06/12/2020 1025   BILIRUBINUR Negative 04/27/2019 [place] 06/12/2020 1025   PROTEINUR NEGATIVE 06/12/2020 1025   UROBILINOGEN 1.0 04/27/2019 0935   NITRITE NEGATIVE 06/12/2020 1025   [place] 06/12/2020 1025    ----------------------------------------------------------------------------------------------------------------   Imaging Results:    No results found.   Assessment & Plan:  Principal Problem:   Sickle cell anemia with crisis ([place]) Active Problems:   Leukocytosis   Morbid obesity with BMI of 50.0-59.9, adult (HCC)   Chronic pain syndrome  Hb Sickle Cell Disease with crisis: Admit patient to MedSurg unit, start IVF 0.45% Saline @ 125 mls/hour, start weight based Dilaudid PCA, start IV Toradol 15 mg Q 6 H for total of 5 days, Restart oral home pain medications, Monitor vitals very closely, Re-evaluate pain scale regularly, 2 L of Oxygen by [place], Patient will be re-evaluated for pain in the context of function and relationship to baseline as care progresses. Leukocytosis: This is very mild at 11.6 and most likely reactive, there is no sign or symptoms of inflammation or infection.  We will monitor very closely without antibiotics. Repeat labs in AM. Anemia of Chronic Disease: Hemoglobin is stable at baseline today.  There is no clinical indication for blood transfusion at this time. We will continue to monitor closely, continue folic acid and will transfuse as needed. Chronic pain Syndrome: Restart and continue home pain medications. Morbid obesity: Patient counseled extensively about healthy diet and controlled exercise as tolerated.  DVT Prophylaxis: Subcut Lovenox   AM Labs Ordered, also please review Full Orders  Family Communication: Admission, patient's condition and plan of care including tests being ordered have been discussed with the patient who indicate understanding and agree with the plan and Code Status.  Code  Status: Full Code  Consults called: None    Admission status: Inpatient    Time spent in minutes : 50 minutes  Angelica Chessman MD, MHA, CPE, FACP 02/12/2021 at 11:42 AM

## 2021-02-12 NOTE — ED Provider Notes (Signed)
[facility] DEPT Provider Note   CSN: [id number] Arrival date & time: 02/12/21  0901     History  Chief Complaint  Patient presents with   Sickle Cell Pain Crisis    Stephanie Burch is a 41 y.o. female.  41 year old female with a past medical history of sickle cell presents to the ED with a chief complaint of sickle cell pain crisis that began last night.  She endorses sharp pain throughout bilateral lower extremities.  She has taken her home regimen which consist on 20 mg of hydrocodone, along Xtampza 18 mg without any improvement in her symptoms. She attempted  to be seen at the sickle cell clinic however this was closed on a Tuesday. Last admission for pain control in the month of November. No fever, no chest pain, no shortness of breath. No prior hx of blood clot.   The history is provided by the patient and medical records.  Sickle Cell Pain Crisis Associated symptoms: no chest pain, no fever and no shortness of breath       Home Medications Prior to Admission medications   Medication Sig Start Date End Date Taking? Authorizing Provider  amoxicillin (AMOXIL) 500 MG capsule Take 500 mg by mouth every 8 (eight) hours. 12/13/20   [provider]  crizanlizumab-tmca (ADAKVEO) 100 MG/10ML SOLN Inject 680 mg into the vein every 30 (thirty) days. 07/20/20   Dorena Dew, FNP  folic acid (FOLVITE) 1 MG tablet Take 1 tablet (1 mg total) by mouth daily. 11/15/18   Azzie Glatter, FNP  gabapentin (NEURONTIN) 300 MG capsule Take 300 mg by mouth 3 (three) times daily.    [provider]  ibuprofen (ADVIL) 800 MG tablet TAKE 1 TABLET BY MOUTH EVERY 8 HOURS AS NEEDED FOR HEADACHE, MILD PAIN, MODERATE PAIN OR CRAMPING Patient taking differently: Take 800 mg by mouth every 8 (eight) hours as needed for headache or mild pain. 12/04/20   Dorena Dew, FNP  ondansetron (ZOFRAN) 4 MG tablet TAKE 1 TABLET BY MOUTH EVERY 8 HOURS AS NEEDED FOR  NAUSEA AND VOMITING 12/24/20   Dorena Dew, FNP  oxyCODONE ER [facility] ER) 18 MG C12A Take 18 mg by mouth every 12 (twelve) hours. 01/23/21 02/22/21  Dorena Dew, FNP  Oxycodone HCl 20 MG TABS Take 1 tablet (20 mg total) by mouth every 4 (four) hours as needed for up to 15 days (pain). 02/04/21 02/19/21  Dorena Dew, FNP  tiZANidine (ZANAFLEX) 4 MG capsule Take 4 mg by mouth 3 (three) times daily. 10/07/20   [provider]  valACYclovir (VALTREX) 500 MG tablet Take Valtrex 500 mg daily for suppression of outbreaks. Take Valtrex 1000 mg daily X 1 week for outbreaks. 10/26/20   Dorena Dew, FNP  Vitamin D, Ergocalciferol, (DRISDOL) 1.25 MG (50000 UT) CAPS capsule Take 1 capsule (50,000 Units total) by mouth every 7 (seven) days. Patient not taking: No sig reported 12/21/18   Azzie Glatter, FNP      Allergies    Patient has no known allergies.    Review of Systems   Review of Systems  Constitutional:  Negative for chills and fever.  Respiratory:  Negative for shortness of breath.   Cardiovascular:  Negative for chest pain.  Gastrointestinal:  Negative for abdominal pain.  Genitourinary:  Negative for flank pain.  Musculoskeletal:  Positive for myalgias.  All other systems reviewed and are negative.  Physical Exam Updated Vital Signs BP (!) 142/84  Pulse 85    Temp 98.2 F (36.8 C) (Oral)    Resp (!) 22    Ht 5\' 5"  (1.651 m)    Wt (!) 138.3 kg    LMP 01/20/2021 (Approximate)    SpO2 95%    BMI 50.75 kg/m  Physical Exam Vitals and nursing note reviewed.  Constitutional:      Appearance: Normal appearance.     Comments: Teary eyed on exam.   HENT:     Mouth/Throat:     Mouth: Mucous membranes are dry.     Comments: Poor dentition throughout, small periapical abscess to the left posterior aspect of her mouth.  Cardiovascular:     Rate and Rhythm: Normal rate.     Comments: No pitting edema, palpable pain along the BL calves without swelling noted.   Pulmonary:     Effort: Pulmonary effort is normal.  Abdominal:     General: Abdomen is flat.  Musculoskeletal:     Cervical back: Neck supple.     Right lower leg: No edema.     Left lower leg: No edema.  Skin:    General: Skin is warm and dry.  Neurological:     Mental Status: She is alert and oriented to person, place, and time.    ED Results / Procedures / Treatments   Labs (all labs ordered are listed, but only abnormal results are displayed) Labs Reviewed  COMPREHENSIVE METABOLIC PANEL - Abnormal; Notable for the following components:      Result Value   Glucose, Bld 111 (*)    AST 13 (*)    All other components within normal limits  CBC WITH DIFFERENTIAL/PLATELET - Abnormal; Notable for the following components:   WBC 11.6 (*)    Hemoglobin 11.5 (*)    HCT 33.4 (*)    MCV 68.0 (*)    MCH 23.4 (*)    RDW 17.9 (*)    nRBC 0.8 (*)    All other components within normal limits  RETICULOCYTES - Abnormal; Notable for the following components:   Retic Ct Pct 5.1 (*)    Retic Count, Absolute 245.9 (*)    Immature Retic Fract 44.1 (*)    All other components within normal limits  RESP PANEL BY RT-PCR (FLU A&B, COVID) ARPGX2  I-STAT BETA HCG BLOOD, ED ([facility], [facility], [facility] ONLY)    EKG None  Radiology No results found.  Procedures Procedures    Medications Ordered in ED Medications  ondansetron (ZOFRAN) injection 4 mg (4 mg Intravenous Given 02/12/21 1030)  0.45 % sodium chloride infusion ( Intravenous New Bag/Given 02/12/21 1103)  diphenhydrAMINE (BENADRYL) injection 25 mg (25 mg Intravenous Given 02/12/21 1027)  ketorolac (TORADOL) 15 MG/ML injection 15 mg (15 mg Intravenous Given 02/12/21 1030)  HYDROmorphone (DILAUDID) injection 2 mg (2 mg Intravenous Given 02/12/21 1030)  HYDROmorphone (DILAUDID) injection 2 mg (2 mg Intravenous Given 02/12/21 1102)    ED Course/ Medical Decision Making/ A&P                           Medical Decision Making  Patient here with  sickle cell pain crisis.  Continued pain despite home medication consisting of oxycodone extended release 18 mg, oxycodone 20 mg every 4 hours.  She is without any chest pain, no shortness of breath.  Teary-eyed during evaluation.  Will order blood work.  Not ordering x-ray or EKG as patient denies any pain along her chest.  She attempted to seek outpatient treatment via sickle cell clinic, however this is close due to staffing.  There my evaluation patient is teary-eyed on arrival.  Her bilateral calves are soft, however there is pain with palpation through the shins.  Abdomen is soft answered to palpation.  No pain along the chest wall with palpation.  Her lungs are clear to auscultation.  She moves all upper and lower extremities.  Interpretation of her labs by me with a CBC with slight leukocytosis of 11.6, hemoglobin slightly decreased within her baseline.  CMP without any electrolyte  Levels within normal limits.  LFTs are at her baseline.  hCG is negative.  Patient received Toradol, Dilaudid, Benadryl, Zofran without any improvement in symptoms.  Patient's last admission for pain crisis was around the month of November, she reports feeling similar in symptoms.  As we have tried a second round of Dilaudid, I do feel that patient needs admission at this time for pain control.  We will call sickle cell clinic at this time.  11:27 AM Spoke to Dr. Doreene Burke who will admit patient for further management.   Portions of this note were generated with Lobby[employer]. Dictation errors may occur despite best attempts at proofreading.  Final Clinical Impression(s) / ED Diagnoses Final diagnoses:  Sickle cell crisis [facility])    Rx / DC Orders ED Discharge Orders     None         Janeece Fitting, PA-C 02/12/21 [place]    Isla Pence, MD 02/12/21 1528

## 2021-02-12 NOTE — ED Notes (Signed)
The patient Is requesting more pain medication. No pain meds ordered at this time. This RN has paged the sickle cell rounding team.

## 2021-02-12 NOTE — ED Notes (Signed)
Pt reports the ordered 1mg  of Dilaudid Q2hr PRN and the 20mg  Oxycodone Q4 hr PRN medications are not helping. Dr. informed via epic secure chat with acknowledgment in his response. No new orders received. Pt updated.

## 2021-02-12 NOTE — ED Triage Notes (Signed)
Patient c/o sickle cell pain in  bilateral lower extremities that started last night.

## 2021-02-13 DIAGNOSIS — D5700 Hb-SS disease with crisis, unspecified: Secondary | ICD-10-CM | POA: Diagnosis not present

## 2021-02-13 MED ORDER — HYDROMORPHONE 1 MG/ML IV SOLN
INTRAVENOUS | Status: DC
  Administered 2021-02-13: 30 mg via INTRAVENOUS
  Administered 2021-02-13: 8.5 mg via INTRAVENOUS
  Administered 2021-02-14: 10.5 mg via INTRAVENOUS
  Administered 2021-02-14: 30 mg via INTRAVENOUS
  Administered 2021-02-14: 4.5 mg via INTRAVENOUS
  Filled 2021-02-13 (×2): qty 30

## 2021-02-13 MED ORDER — BENZOCAINE 10 % MT GEL
Freq: Once | OROMUCOSAL | Status: AC
  Filled 2021-02-13: qty 9.4

## 2021-02-13 NOTE — Progress Notes (Signed)
Subjective: Stephanie Burch is a 41 year old female with a medical history significant for sickle cell disease, chronic pain syndrome, opiate dependence and tolerance, history of anemia of chronic disease, and morbid obesity was admitted for sickle cell pain crisis. Patient states that pain intensity has improved some overnight.  She rates her pain as 7/10 primarily to lower extremities.  She denies headache, chest pain, urinary symptoms, nausea, vomiting, or diarrhea.  Objective:  Vital signs in last 24 hours:  Vitals:   02/13/21 0721 02/13/21 1112 02/13/21 1112 02/13/21 1441  BP:   120/70   Pulse:   72   Resp: 16 17 19 19   Temp:   (!) 97.5 F (36.4 C)   TempSrc:   Oral   SpO2: 96% 96% 99% 93%  Weight:      Height:        Intake/Output from previous day:   Intake/Output Summary (Last 24 hours) at 02/13/2021 1709 Last data filed at 02/13/2021 04/13/2021 Gross per 24 hour  Intake 1377.8 ml  Output --  Net 1377.8 ml    Physical Exam: General: Alert, awake, oriented x3, in no acute distress.  HEENT: [place]/AT PEERL, EOMI Neck: Trachea midline,  no masses, no thyromegal,y no JVD, no carotid bruit OROPHARYNX:  Moist, No exudate/ erythema/lesions.  Heart: Regular rate and rhythm, without murmurs, rubs, gallops, PMI non-displaced, no heaves or thrills on palpation.  Lungs: Clear to auscultation, no wheezing or rhonchi noted. No increased vocal fremitus resonant to percussion  Abdomen: Soft, nontender, nondistended, positive bowel sounds, no masses no hepatosplenomegaly noted..  Neuro: No focal neurological deficits noted cranial nerves II through XII grossly intact. DTRs 2+ bilaterally upper and lower extremities. Strength 5 out of 5 in bilateral upper and lower extremities. Musculoskeletal: No warm swelling or erythema around joints, no spinal tenderness noted. Psychiatric: Patient alert and oriented x3, good insight and cognition, good recent to remote recall. Lymph node survey: No cervical  axillary or inguinal lymphadenopathy noted.  Lab Results:  Basic Metabolic Panel:    Component Value Date/Time   NA 140 02/12/2021 0935   NA 142 09/11/2017 1431   K 3.5 02/12/2021 0935   CL 109 02/12/2021 0935   CO2 23 02/12/2021 0935   BUN 8 02/12/2021 0935   BUN 8 09/11/2017 1431   CREATININE 0.62 02/12/2021 0935   GLUCOSE 111 (H) 02/12/2021 0935   CALCIUM 9.0 02/12/2021 0935   CBC:    Component Value Date/Time   WBC 11.6 (H) 02/12/2021 0935   HGB 11.5 (L) 02/12/2021 0935   HGB 13.0 09/11/2017 1431   HCT 33.4 (L) 02/12/2021 0935   HCT 38.4 09/11/2017 1431   PLT 315 02/12/2021 0935   PLT 258 09/11/2017 1431   MCV 68.0 (L) 02/12/2021 0935   MCV 75 (L) 09/11/2017 1431   NEUTROABS 7.7 02/12/2021 0935   NEUTROABS 3.9 09/11/2017 1431   LYMPHSABS 2.9 02/12/2021 0935   LYMPHSABS 2.2 09/11/2017 1431   MONOABS 0.7 02/12/2021 0935   EOSABS 0.2 02/12/2021 0935   EOSABS 0.1 09/11/2017 1431   BASOSABS 0.0 02/12/2021 0935   BASOSABS 0.0 09/11/2017 1431    Recent Results (from the past 240 hour(s))  Resp Panel by RT-PCR (Flu A&B, Covid) Nasopharyngeal Swab     Status: None   Collection Time: 02/12/21 12:42 PM   Specimen: Nasopharyngeal Swab; Nasopharyngeal(NP) swabs in vial transport medium  Result Value Ref Range Status   SARS Coronavirus 2 by RT PCR NEGATIVE NEGATIVE Final    Comment: (NOTE)  SARS-CoV-2 target nucleic acids are NOT DETECTED.  The SARS-CoV-2 RNA is generally detectable in upper respiratory specimens during the acute phase of infection. The lowest concentration of SARS-CoV-2 viral copies this assay can detect is 138 copies/mL. A negative result does not preclude SARS-Cov-2 infection and should not be used as the sole basis for treatment or other patient management decisions. A negative result may occur with  improper specimen collection/handling, submission of specimen other than nasopharyngeal swab, presence of viral mutation(s) within the areas targeted  by this assay, and inadequate number of viral copies(<138 copies/mL). A negative result must be combined with clinical observations, patient history, and epidemiological information. The expected result is Negative.  Fact Sheet for Patients:  BloggerCourse.com  Fact Sheet for Healthcare Providers:  SeriousBroker.it  This test is no t yet approved or cleared by the [place] FDA and  has been authorized for detection and/or diagnosis of SARS-CoV-2 by FDA under an Emergency Use Authorization (EUA). This EUA will remain  in effect (meaning this test can be used) for the duration of the COVID-19 declaration under Section 564(b)(1) of the Act, 21 U.S.C.section 360bbb-3(b)(1), unless the authorization is terminated  or revoked sooner.       Influenza A by PCR NEGATIVE NEGATIVE Final   Influenza B by PCR NEGATIVE NEGATIVE Final    Comment: (NOTE) The Xpert Xpress SARS-CoV-2/FLU/RSV plus assay is intended as an aid in the diagnosis of influenza from Nasopharyngeal swab specimens and should not be used as a sole basis for treatment. Nasal washings and aspirates are unacceptable for Xpert Xpress SARS-CoV-2/FLU/RSV testing.  Fact Sheet for Patients: BloggerCourse.com  Fact Sheet for Healthcare Providers: SeriousBroker.it  This test is not yet approved or cleared by the [place] FDA and has been authorized for detection and/or diagnosis of SARS-CoV-2 by FDA under an Emergency Use Authorization (EUA). This EUA will remain in effect (meaning this test can be used) for the duration of the COVID-19 declaration under Section 564(b)(1) of the Act, 21 U.S.C. section 360bbb-3(b)(1), unless the authorization is terminated or revoked.  Performed at [facility], [address], [place], [place] [address]     Studies/Results: No results  found.  Medications: Scheduled Meds:  enoxaparin (LOVENOX) injection  40 mg Subcutaneous Q24H   folic acid  1 mg Oral Daily   gabapentin  300 mg Oral TID   HYDROmorphone   Intravenous Q4H   ketorolac  15 mg Intravenous Q6H   oxyCODONE  20 mg Oral Q12H   senna-docusate  1 tablet Oral BID   Continuous Infusions:  sodium chloride 10 mL/hr at 02/13/21 1303   diphenhydrAMINE     PRN Meds:.diphenhydrAMINE **OR** diphenhydrAMINE, naloxone **AND** sodium chloride flush, ondansetron (ZOFRAN) IV, oxyCODONE, polyethylene glycol  Consultants: none  Procedures: none  Antibiotics: none  Assessment/Plan: Principal Problem:   Sickle cell anemia with crisis (HCC) Active Problems:   Leukocytosis   Morbid obesity with BMI of 50.0-59.9, adult (HCC)   Chronic pain syndrome  Sickle cell disease with pain crisis: Decrease IV fluids to KVO Continue IV Dilaudid PCA without any changes Toradol 15 mg IV every 6 hours for total of 5 days Oxycodone 20 mg every 4 hours as needed for breakthrough pain Monitor vital signs very closely, reevaluate pain scale regularly, and supplemental oxygen as needed  Leukocytosis: Stable.  Patient afebrile.  No apparent infection.  Monitor closely without antibiotics.  Follow labs.  Anemia of chronic disease: Hemoglobin remained stable and consistent with patient's baseline.  There is no clinical indication for blood transfusion at this time.  Monitor closely.  Chronic pain syndrome: Continue home medications  Morbid obesity: Patient counseled extensively about maintaining a heart healthy diet.   Code Status: Full Code Family Communication: N/A Disposition Plan: Not yet ready for discharge  Edynn Gillock Rennis PettyMoore Sylvester Salonga  APRN, MSN, FNP-C [facility] [address]  [place], [place] [address] [phone]  If 5PM-8AM, please contact night-coverage.  02/13/2021, 5:09 PM  LOS: 1 day

## 2021-02-13 NOTE — Plan of Care (Signed)
°  Problem: Education: Goal: Knowledge of vaso-occlusive preventative measures will improve Outcome: Progressing Goal: Awareness of infection prevention will improve Outcome: Progressing Goal: Awareness of signs and symptoms of anemia will improve Outcome: Progressing Goal: Long-term complications will improve Outcome: Progressing   Problem: Self-Care: Goal: Ability to incorporate actions that prevent/reduce pain crisis will improve Outcome: Progressing   Problem: Bowel/Gastric: Goal: Gut motility will be maintained Outcome: Progressing   Problem: Respiratory: Goal: Pulmonary complications will be avoided or minimized Outcome: Progressing Goal: Acute Chest Syndrome will be identified early to prevent complications Outcome: Progressing

## 2021-02-14 DIAGNOSIS — D5700 Hb-SS disease with crisis, unspecified: Secondary | ICD-10-CM | POA: Diagnosis not present

## 2021-02-14 MED ORDER — LIP MEDEX EX OINT
TOPICAL_OINTMENT | CUTANEOUS | Status: DC | PRN
  Filled 2021-02-14: qty 7

## 2021-02-14 MED ORDER — HYDROMORPHONE 1 MG/ML IV SOLN
INTRAVENOUS | Status: DC
  Administered 2021-02-14 (×2): 3 mg via INTRAVENOUS
  Administered 2021-02-14: 6 mg via INTRAVENOUS
  Administered 2021-02-14: 9 mg via INTRAVENOUS
  Administered 2021-02-15: 7.5 mg via INTRAVENOUS
  Administered 2021-02-15: 6.5 mg via INTRAVENOUS
  Administered 2021-02-15: 5.5 mg via INTRAVENOUS
  Administered 2021-02-15: 30 mg via INTRAVENOUS
  Filled 2021-02-14: qty 30

## 2021-02-14 MED ORDER — BENZOCAINE 10 % MT GEL
Freq: Three times a day (TID) | OROMUCOSAL | Status: DC | PRN
  Filled 2021-02-14: qty 9.4

## 2021-02-14 NOTE — Progress Notes (Signed)
Subjective: Stephanie Burch is a 41 year old female with a medical history significant for sickle cell disease, chronic pain syndrome, opiate dependence and tolerance, history of anemia of chronic disease, and morbid obesity was admitted for sickle cell pain crisis. Patient has no new complaints on today.  Pain intensity is 6/10.  She is requesting 1 more day in the hospital.  She denies any headache, chest pain, urinary symptoms, nausea, vomiting, or diarrhea.  Objective:  Vital signs in last 24 hours:  Vitals:   02/14/21 0746 02/14/21 1008 02/14/21 1029 02/14/21 1209  BP:  (!) 129/93    Pulse:  95    Resp: 16 18 20 16   Temp:  97.7 F (36.5 C)    TempSrc:  Oral    SpO2: 91% 94% 96% 96%  Weight:      Height:        Intake/Output from previous day:   Intake/Output Summary (Last 24 hours) at 02/14/2021 1441 Last data filed at 02/14/2021 1009 Gross per 24 hour  Intake 240 ml  Output --  Net 240 ml    Physical Exam: General: Alert, awake, oriented x3, in no acute distress.  HEENT: [place]/AT PEERL, EOMI Neck: Trachea midline,  no masses, no thyromegal,y no JVD, no carotid bruit OROPHARYNX:  Moist, No exudate/ erythema/lesions.  Heart: Regular rate and rhythm, without murmurs, rubs, gallops, PMI non-displaced, no heaves or thrills on palpation.  Lungs: Clear to auscultation, no wheezing or rhonchi noted. No increased vocal fremitus resonant to percussion  Abdomen: Soft, nontender, nondistended, positive bowel sounds, no masses no hepatosplenomegaly noted..  Neuro: No focal neurological deficits noted cranial nerves II through XII grossly intact. DTRs 2+ bilaterally upper and lower extremities. Strength 5 out of 5 in bilateral upper and lower extremities. Musculoskeletal: No warm swelling or erythema around joints, no spinal tenderness noted. Psychiatric: Patient alert and oriented x3, good insight and cognition, good recent to remote recall. Lymph node survey: No cervical axillary or  inguinal lymphadenopathy noted.  Lab Results:  Basic Metabolic Panel:    Component Value Date/Time   NA 140 02/12/2021 0935   NA 142 09/11/2017 1431   K 3.5 02/12/2021 0935   CL 109 02/12/2021 0935   CO2 23 02/12/2021 0935   BUN 8 02/12/2021 0935   BUN 8 09/11/2017 1431   CREATININE 0.62 02/12/2021 0935   GLUCOSE 111 (H) 02/12/2021 0935   CALCIUM 9.0 02/12/2021 0935   CBC:    Component Value Date/Time   WBC 11.6 (H) 02/12/2021 0935   HGB 11.5 (L) 02/12/2021 0935   HGB 13.0 09/11/2017 1431   HCT 33.4 (L) 02/12/2021 0935   HCT 38.4 09/11/2017 1431   PLT 315 02/12/2021 0935   PLT 258 09/11/2017 1431   MCV 68.0 (L) 02/12/2021 0935   MCV 75 (L) 09/11/2017 1431   NEUTROABS 7.7 02/12/2021 0935   NEUTROABS 3.9 09/11/2017 1431   LYMPHSABS 2.9 02/12/2021 0935   LYMPHSABS 2.2 09/11/2017 1431   MONOABS 0.7 02/12/2021 0935   EOSABS 0.2 02/12/2021 0935   EOSABS 0.1 09/11/2017 1431   BASOSABS 0.0 02/12/2021 0935   BASOSABS 0.0 09/11/2017 1431    Recent Results (from the past 240 hour(s))  Resp Panel by RT-PCR (Flu A&B, Covid) Nasopharyngeal Swab     Status: None   Collection Time: 02/12/21 12:42 PM   Specimen: Nasopharyngeal Swab; Nasopharyngeal(NP) swabs in vial transport medium  Result Value Ref Range Status   SARS Coronavirus 2 by RT PCR NEGATIVE NEGATIVE Final  Comment: (NOTE) SARS-CoV-2 target nucleic acids are NOT DETECTED.  The SARS-CoV-2 RNA is generally detectable in upper respiratory specimens during the acute phase of infection. The lowest concentration of SARS-CoV-2 viral copies this assay can detect is 138 copies/mL. A negative result does not preclude SARS-Cov-2 infection and should not be used as the sole basis for treatment or other patient management decisions. A negative result may occur with  improper specimen collection/handling, submission of specimen other than nasopharyngeal swab, presence of viral mutation(s) within the areas targeted by this  assay, and inadequate number of viral copies(<138 copies/mL). A negative result must be combined with clinical observations, patient history, and epidemiological information. The expected result is Negative.  Fact Sheet for Patients:  BloggerCourse.comhttps://www.fda.gov/media/152166/download  Fact Sheet for Healthcare Providers:  SeriousBroker.ithttps://www.fda.gov/media/152162/download  This test is no t yet approved or cleared by the [place] FDA and  has been authorized for detection and/or diagnosis of SARS-CoV-2 by FDA under an Emergency Use Authorization (EUA). This EUA will remain  in effect (meaning this test can be used) for the duration of the COVID-19 declaration under Section 564(b)(1) of the Act, 21 U.S.C.section 360bbb-3(b)(1), unless the authorization is terminated  or revoked sooner.       Influenza A by PCR NEGATIVE NEGATIVE Final   Influenza B by PCR NEGATIVE NEGATIVE Final    Comment: (NOTE) The Xpert Xpress SARS-CoV-2/FLU/RSV plus assay is intended as an aid in the diagnosis of influenza from Nasopharyngeal swab specimens and should not be used as a sole basis for treatment. Nasal washings and aspirates are unacceptable for Xpert Xpress SARS-CoV-2/FLU/RSV testing.  Fact Sheet for Patients: BloggerCourse.comhttps://www.fda.gov/media/152166/download  Fact Sheet for Healthcare Providers: SeriousBroker.ithttps://www.fda.gov/media/152162/download  This test is not yet approved or cleared by the [place] FDA and has been authorized for detection and/or diagnosis of SARS-CoV-2 by FDA under an Emergency Use Authorization (EUA). This EUA will remain in effect (meaning this test can be used) for the duration of the COVID-19 declaration under Section 564(b)(1) of the Act, 21 U.S.C. section 360bbb-3(b)(1), unless the authorization is terminated or revoked.  Performed at [facility], [address] [place], [place] [address]     Studies/Results: No results found.  Medications: Scheduled  Meds:  enoxaparin (LOVENOX) injection  40 mg Subcutaneous Q24H   folic acid  1 mg Oral Daily   gabapentin  300 mg Oral TID   HYDROmorphone   Intravenous Q4H   ketorolac  15 mg Intravenous Q6H   oxyCODONE  20 mg Oral Q12H   senna-docusate  1 tablet Oral BID   Continuous Infusions:  sodium chloride 10 mL/hr at 02/13/21 1303   diphenhydrAMINE     PRN Meds:.diphenhydrAMINE **OR** diphenhydrAMINE, lip balm, naloxone **AND** sodium chloride flush, ondansetron (ZOFRAN) IV, oxyCODONE, polyethylene glycol  Consultants: None  Procedures: None  Antibiotics: None  Assessment/Plan: Principal Problem:   Sickle cell anemia with crisis (HCC) Active Problems:   Leukocytosis   Morbid obesity with BMI of 50.0-59.9, adult (HCC)   Chronic pain syndrome  Sickle cell disease with pain crisis: Weaning IV Dilaudid PCA Oxycodone 20 mg every 4 hours as needed for breakthrough pain.  Toradol 15 mg IV every 6 hours for total of 5 days. Monitor vital signs very closely, reevaluate pain scale regularly, and supplemental oxygen as needed  Leukocytosis: Stable.  Patient remains afebrile.  No apparent infection.  Continue to monitor closely.  Anemia of chronic disease: Hemoglobin stable and consistent with patient's baseline.  No clinical indication for blood  transfusion.  Follow labs.  Chronic pain syndrome: Continue home medications  Morbid obesity: Continue heart healthy diet.   Code Status: Full Code Family Communication: N/A Disposition Plan: Not yet ready for discharge.  Discharge planned for 02/15/2021  Nolon Nations  APRN, MSN, FNP-C Patient Care [facility] [address] [place], [place] [address] [phone]  If 5PM-8AM, please contact night-coverage.  02/14/2021, 2:41 PM  LOS: 2 days

## 2021-02-15 DIAGNOSIS — D5700 Hb-SS disease with crisis, unspecified: Secondary | ICD-10-CM | POA: Diagnosis not present

## 2021-02-15 NOTE — Discharge Summary (Signed)
Physician Discharge Summary  Stephanie Burch [id number]:[id number] DOB: 02-26-1980 DOA: 02/12/2021  PCP: Massie MaroonHollis, Jiovani Mccammon M, FNP  Admit date: 02/12/2021  Discharge date: 02/15/2021  Discharge Diagnoses:  Principal Problem:   Sickle cell anemia with crisis (HCC) Active Problems:   Leukocytosis   Morbid obesity with BMI of 50.0-59.9, adult (HCC)   Chronic pain syndrome   Discharge Condition: Stable  Disposition:   Follow-up Information     Massie MaroonHollis, Nyasha Rahilly M, FNP Follow up.   Specialty: Family Medicine Contact information: [address] [place] [address] [phone]                Pt is discharged home in good condition and is to follow up with Massie MaroonHollis, Shayra Anton M, FNP this week to have labs evaluated. Stephanie Burch is instructed to increase activity slowly and balance with rest for the next few days, and use prescribed medication to complete treatment of pain  Diet: Regular Wt Readings from Last 3 Encounters:  02/12/21 (!) 138.3 kg  12/19/20 (!) 140.9 kg  10/09/20 136.1 kg    History of present illness:  Stephanie StarchKimaada Burch is a 41 year old female with a medical history significant for sickle cell disease, chronic pain syndrome, opiate dependence and tolerance, morbid obesity, and anemia of chronic disease who presented to the emergency room with major complaints of low back and lower extremity pain that is typical for her sickle cell pain crisis.  Has had this pain for a few days and has been taking her home pain medications with no sustained relief.  She rates her pain a #10/10.  Pain is characterized as throbbing and achy.  She attributes this pain to changes in weather and some increased stressors at home.  She denies any fevers, chills, cough, chest pain, shortness of breath, nausea, vomiting, or diarrhea.  Patient has no current urinary symptoms.  She denies any sick contacts, recent travel, or known exposure to COVID-19.  ER course: Vital signs shows: BP 142/84,  pulse 85, temperature 98.2 F, respirations 22, and oxygen saturation 95%.  Mildly elevated [place] cell count at 11.6, hemoglobin at baseline of 11.5, normal platelet count at 315.  Comprehensive metabolic panel was unremarkable.  Patient received multiple doses of IV Dilaudid and IV Toradol in the emergency room with no significant relief of her pain.  Patient will be admitted to MedSurg unit for further evaluation and management of sickle cell pain crisis.  Hospital Course:  Sickle cell disease with pain crisis: Patient was admitted for sickle cell pain crisis and managed appropriately with IVF, IV Dilaudid via PCA and IV Toradol, as well as other adjunct therapies per sickle cell pain management protocols.  IV Dilaudid PCA weaned appropriately and patient transition to her home medications.  Pain intensity is 5/10 and patient is requesting discharge home.  She will follow-up with her PCP in 2 weeks for medication management.  Also, repeat CBC and BMP at that time.  Patient is alert, oriented, and ambulating without assistance.  Patient was therefore discharged home today in a hemodynamically stable condition.   Stephanie Burch will follow-up with PCP within 1 week of this discharge. Stephanie Burch was counseled extensively about nonpharmacologic means of pain management, patient verbalized understanding and was appreciative of  the care received during this admission.   We discussed the need for good hydration, monitoring of hydration status, avoidance of heat, cold, stress, and infection triggers. We discussed the need to be adherent with taking home medications. Patient was reminded of the  need to seek medical attention immediately if any symptom of bleeding, anemia, or infection occurs.  Discharge Exam: Vitals:   02/15/21 0736 02/15/21 1107  BP:    Pulse:    Resp: 14 16  Temp:    SpO2: 94% 96%   Vitals:   02/15/21 0309 02/15/21 0528 02/15/21 0736 02/15/21 1107  BP:  132/89    Pulse:  88    Resp: 14 14  14 16   Temp:  98.1 F (36.7 C)    TempSrc:  Oral    SpO2: 97% 99% 94% 96%  Weight:      Height:        General appearance : Awake, alert, not in any distress. Speech Clear. Not toxic looking HEENT: Atraumatic and Normocephalic, pupils equally reactive to light and accomodation Neck: Supple, no JVD. No cervical lymphadenopathy.  Chest: Good air entry bilaterally, no added sounds  CVS: S1 S2 regular, no murmurs.  Abdomen: Bowel sounds present, Non tender and not distended with no gaurding, rigidity or rebound. Extremities: B/L Lower Ext shows no edema, both legs are warm to touch Neurology: Awake alert, and oriented X 3, CN II-XII intact, Non focal Skin: No Rash  Discharge Instructions  Discharge Instructions     Discharge patient   Complete by: As directed    Discharge disposition: 01-Home or Self Care   Discharge patient date: 02/15/2021      Allergies as of 02/15/2021   No Known Allergies      Medication List     TAKE these medications    folic acid 1 MG tablet Commonly known as: FOLVITE Take 1 tablet (1 mg total) by mouth daily.   gabapentin 300 MG capsule Commonly known as: NEURONTIN Take 300 mg by mouth 3 (three) times daily.   ibuprofen 200 MG tablet Commonly known as: ADVIL Take 800-1,000 mg by mouth every 6 (six) hours as needed for fever, headache or mild pain.   ondansetron 4 MG tablet Commonly known as: ZOFRAN TAKE 1 TABLET BY MOUTH EVERY 8 HOURS AS NEEDED FOR NAUSEA AND VOMITING   Oxycodone HCl 20 MG Tabs Take 1 tablet (20 mg total) by mouth every 4 (four) hours as needed for up to 15 days (pain).   tiZANidine 4 MG capsule Commonly known as: ZANAFLEX Take 4 mg by mouth 3 (three) times daily.   Xtampza ER 18 MG C12a Generic drug: oxyCODONE ER Take 18 mg by mouth every 12 (twelve) hours.        The results of significant diagnostics from this hospitalization (including imaging, microbiology, ancillary and laboratory) are listed below for  reference.    Significant Diagnostic Studies: No results found.  Microbiology: Recent Results (from the past 240 hour(s))  Resp Panel by RT-PCR (Flu A&B, Covid) Nasopharyngeal Swab     Status: None   Collection Time: 02/12/21 12:42 PM   Specimen: Nasopharyngeal Swab; Nasopharyngeal(NP) swabs in vial transport medium  Result Value Ref Range Status   SARS Coronavirus 2 by RT PCR NEGATIVE NEGATIVE Final    Comment: (NOTE) SARS-CoV-2 target nucleic acids are NOT DETECTED.  The SARS-CoV-2 RNA is generally detectable in upper respiratory specimens during the acute phase of infection. The lowest concentration of SARS-CoV-2 viral copies this assay can detect is 138 copies/mL. A negative result does not preclude SARS-Cov-2 infection and should not be used as the sole basis for treatment or other patient management decisions. A negative result may occur with  improper specimen collection/handling, submission of specimen other  than nasopharyngeal swab, presence of viral mutation(s) within the areas targeted by this assay, and inadequate number of viral copies(<138 copies/mL). A negative result must be combined with clinical observations, patient history, and epidemiological information. The expected result is Negative.  Fact Sheet for Patients:  BloggerCourse.com  Fact Sheet for Healthcare Providers:  SeriousBroker.it  This test is no t yet approved or cleared by the [place] FDA and  has been authorized for detection and/or diagnosis of SARS-CoV-2 by FDA under an Emergency Use Authorization (EUA). This EUA will remain  in effect (meaning this test can be used) for the duration of the COVID-19 declaration under Section 564(b)(1) of the Act, 21 U.S.C.section 360bbb-3(b)(1), unless the authorization is terminated  or revoked sooner.       Influenza A by PCR NEGATIVE NEGATIVE Final   Influenza B by PCR NEGATIVE NEGATIVE Final     Comment: (NOTE) The Xpert Xpress SARS-CoV-2/FLU/RSV plus assay is intended as an aid in the diagnosis of influenza from Nasopharyngeal swab specimens and should not be used as a sole basis for treatment. Nasal washings and aspirates are unacceptable for Xpert Xpress SARS-CoV-2/FLU/RSV testing.  Fact Sheet for Patients: BloggerCourse.com  Fact Sheet for Healthcare Providers: SeriousBroker.it  This test is not yet approved or cleared by the [place] FDA and has been authorized for detection and/or diagnosis of SARS-CoV-2 by FDA under an Emergency Use Authorization (EUA). This EUA will remain in effect (meaning this test can be used) for the duration of the COVID-19 declaration under Section 564(b)(1) of the Act, 21 U.S.C. section 360bbb-3(b)(1), unless the authorization is terminated or revoked.  Performed at [facility], [address], [place], [place] [address]      Labs: Basic Metabolic Panel: Recent Labs  Lab 02/12/21 0935  NA 140  K 3.5  CL 109  CO2 23  GLUCOSE 111*  BUN 8  CREATININE 0.62  CALCIUM 9.0   Liver Function Tests: Recent Labs  Lab 02/12/21 0935  AST 13*  ALT 15  ALKPHOS 69  BILITOT 0.8  PROT 7.2  ALBUMIN 3.6   No results for input(s): LIPASE, AMYLASE in the last 168 hours. No results for input(s): AMMONIA in the last 168 hours. CBC: Recent Labs  Lab 02/12/21 0935  WBC 11.6*  NEUTROABS 7.7  HGB 11.5*  HCT 33.4*  MCV 68.0*  PLT 315   Cardiac Enzymes: No results for input(s): CKTOTAL, CKMB, CKMBINDEX, TROPONINI in the last 168 hours. BNP: Invalid input(s): POCBNP CBG: No results for input(s): GLUCAP in the last 168 hours.  Time coordinating discharge: 30 minutes  Signed: Nolon Nations  APRN, MSN, FNP-C Patient Care [facility] [address] [place], [place] [address] [phone]  [facility] 02/15/2021,  12:00 PM

## 2021-02-16 ENCOUNTER — Telehealth: Payer: Self-pay

## 2021-02-16 NOTE — Telephone Encounter (Signed)
Transition Care Management Unsuccessful Follow-up Telephone Call  Date of discharge and from where:  02/16/2020-[facility]   Attempts:  1st Attempt  Reason for unsuccessful TCM follow-up call:  Unable to reach patient

## 2021-02-17 NOTE — Telephone Encounter (Signed)
Transition Care Management Unsuccessful Follow-up Telephone Call  Date of discharge and from where:  02/15/2021-[facility]  Attempts:  2nd Attempt  Reason for unsuccessful TCM follow-up call:  Unable to reach patient

## 2021-02-18 ENCOUNTER — Telehealth (HOSPITAL_COMMUNITY): Payer: Self-pay

## 2021-02-18 ENCOUNTER — Other Ambulatory Visit: Payer: Self-pay | Admitting: Family Medicine

## 2021-02-18 ENCOUNTER — Non-Acute Institutional Stay (HOSPITAL_COMMUNITY)
Admission: AD | Admit: 2021-02-18 | Discharge: 2021-02-18 | Disposition: A | Discharge status: Home / Self Care | Payer: Medicare Other | Source: Ambulatory Visit | Attending: Internal Medicine | Admitting: Internal Medicine

## 2021-02-18 ENCOUNTER — Telehealth: Payer: Self-pay

## 2021-02-18 ENCOUNTER — Other Ambulatory Visit: Payer: Self-pay | Admitting: Internal Medicine

## 2021-02-18 DIAGNOSIS — M79605 Pain in left leg: Secondary | ICD-10-CM | POA: Insufficient documentation

## 2021-02-18 DIAGNOSIS — D5700 Hb-SS disease with crisis, unspecified: Secondary | ICD-10-CM | POA: Diagnosis present

## 2021-02-18 DIAGNOSIS — G894 Chronic pain syndrome: Secondary | ICD-10-CM | POA: Diagnosis not present

## 2021-02-18 DIAGNOSIS — M79604 Pain in right leg: Secondary | ICD-10-CM | POA: Insufficient documentation

## 2021-02-18 DIAGNOSIS — D638 Anemia in other chronic diseases classified elsewhere: Secondary | ICD-10-CM | POA: Diagnosis not present

## 2021-02-18 DIAGNOSIS — D571 Sickle-cell disease without crisis: Secondary | ICD-10-CM

## 2021-02-18 DIAGNOSIS — F1120 Opioid dependence, uncomplicated: Secondary | ICD-10-CM | POA: Insufficient documentation

## 2021-02-18 DIAGNOSIS — F1190 Opioid use, unspecified, uncomplicated: Secondary | ICD-10-CM

## 2021-02-18 LAB — URINALYSIS, ROUTINE W REFLEX MICROSCOPIC
Bilirubin Urine: NEGATIVE
Glucose, UA: NEGATIVE mg/dL
Hgb urine dipstick: NEGATIVE
Ketones, ur: NEGATIVE mg/dL
Leukocytes,Ua: NEGATIVE
Nitrite: NEGATIVE
Protein, ur: NEGATIVE mg/dL
Specific Gravity, Urine: 1.01 (ref 1.005–1.030)
pH: 7 (ref 5.0–8.0)

## 2021-02-18 LAB — CBC WITH DIFFERENTIAL/PLATELET
Abs Immature Granulocytes: 0.04 10*3/uL (ref 0.00–0.07)
Basophils Absolute: 0.1 10*3/uL (ref 0.0–0.1)
Basophils Relative: 1 %
Eosinophils Absolute: 0.2 10*3/uL (ref 0.0–0.5)
Eosinophils Relative: 2 %
HCT: 35.1 % — ABNORMAL LOW (ref 36.0–46.0)
Hemoglobin: 11.9 g/dL — ABNORMAL LOW (ref 12.0–15.0)
Immature Granulocytes: 0 %
Lymphocytes Relative: 24 %
Lymphs Abs: 2.7 10*3/uL (ref 0.7–4.0)
MCH: 23.1 pg — ABNORMAL LOW (ref 26.0–34.0)
MCHC: 33.9 g/dL (ref 30.0–36.0)
MCV: 68.2 fL — ABNORMAL LOW (ref 80.0–100.0)
Monocytes Absolute: 1 10*3/uL (ref 0.1–1.0)
Monocytes Relative: 9 %
Neutro Abs: 7.2 10*3/uL (ref 1.7–7.7)
Neutrophils Relative %: 64 %
Platelets: 314 10*3/uL (ref 150–400)
RBC: 5.15 MIL/uL — ABNORMAL HIGH (ref 3.87–5.11)
RDW: 18.2 % — ABNORMAL HIGH (ref 11.5–15.5)
WBC: 11.1 10*3/uL — ABNORMAL HIGH (ref 4.0–10.5)
nRBC: 0.3 % — ABNORMAL HIGH (ref 0.0–0.2)

## 2021-02-18 MED ORDER — HYDROMORPHONE 1 MG/ML IV SOLN
INTRAVENOUS | Status: DC
  Administered 2021-02-18: 14 mg via INTRAVENOUS
  Administered 2021-02-18: 30 mg via INTRAVENOUS
  Filled 2021-02-18: qty 30

## 2021-02-18 MED ORDER — SENNOSIDES-DOCUSATE SODIUM 8.6-50 MG PO TABS: 1.0000 | ORAL_TABLET | Freq: Two times a day (BID) | ORAL | Status: DC

## 2021-02-18 MED ORDER — OXYCODONE HCL 20 MG PO TABS: 20.0000 mg | ORAL_TABLET | ORAL | 0 refills | Status: DC | PRN

## 2021-02-18 MED ORDER — KETOROLAC TROMETHAMINE 15 MG/ML IJ SOLN
15.0000 mg | Freq: Once | INTRAMUSCULAR | Status: AC
  Administered 2021-02-18: 15 mg via INTRAVENOUS
  Filled 2021-02-18: qty 1

## 2021-02-18 MED ORDER — XTAMPZA ER 18 MG PO C12A: 18.0000 mg | EXTENDED_RELEASE_CAPSULE | Freq: Two times a day (BID) | ORAL | 0 refills | Status: DC

## 2021-02-18 MED ORDER — NALOXONE HCL 0.4 MG/ML IJ SOLN: 0.4000 mg | INTRAMUSCULAR | Status: DC | PRN

## 2021-02-18 MED ORDER — SODIUM CHLORIDE 0.45 % IV SOLN: INTRAVENOUS | Status: DC

## 2021-02-18 MED ORDER — POLYETHYLENE GLYCOL 3350 17 G PO PACK: 17.0000 g | PACK | Freq: Every day | ORAL | Status: DC | PRN

## 2021-02-18 MED ORDER — DIPHENHYDRAMINE HCL 25 MG PO CAPS: 25.0000 mg | ORAL_CAPSULE | ORAL | Status: DC | PRN

## 2021-02-18 MED ORDER — SODIUM CHLORIDE 0.9% FLUSH: 9.0000 mL | INTRAVENOUS | Status: DC | PRN

## 2021-02-18 MED ORDER — ONDANSETRON HCL 4 MG/2ML IJ SOLN
4.0000 mg | Freq: Four times a day (QID) | INTRAMUSCULAR | Status: DC | PRN
  Administered 2021-02-18: 4 mg via INTRAVENOUS
  Filled 2021-02-18: qty 2

## 2021-02-18 MED ORDER — OXYCODONE HCL 5 MG PO TABS
20.0000 mg | ORAL_TABLET | Freq: Once | ORAL | Status: AC
Start: 2021-02-18 — End: 2021-02-18
  Administered 2021-02-18: 20 mg via ORAL
  Filled 2021-02-18: qty 4

## 2021-02-18 MED ORDER — SODIUM CHLORIDE 0.9 % IV SOLN
25.0000 mg | INTRAVENOUS | Status: DC | PRN
  Filled 2021-02-18: qty 0.5

## 2021-02-18 NOTE — Telephone Encounter (Signed)
Xtampza Oxycodone  

## 2021-02-18 NOTE — Discharge Summary (Signed)
Physician Discharge Summary  Stephanie Burch YIF:[id number] DOB: 03/08/1980 DOA: 02/18/2021  PCP: Massie Maroon, FNP  Admit date: 02/18/2021  Discharge date: 02/18/2021  Time spent: 30 minutes  Discharge Diagnoses:  Principal Problem:   Sickle cell anemia with crisis [facility])  Discharge Condition: Stable  Diet recommendation: Regular  History of present illness:  Stephanie Burch is a 41 y.o. female with history of sickle cell disease, chronic pain syndrome, opiate dependence and tolerance, morbid obesity and anemia of chronic disease who was recently discharged from the hospital on 02/15/2021, presented to the day hospital this morning with major complaints of significant pain in her lower back and lower extremities that is consistent with her typical sickle cell pain crisis.  Patient claims her pain did not really resolved to her leg which she can manage at home, and on top of that she ran out of her home medications even though she has 15-day supply from 02/04/2021 and had been on admission for 4 days in between yet she says she had run out of her medications.  She rates her pain at 10/10 this morning, constant and throbbing. She denies any fever, chills, headache, cough, chest pain, shortness of breath, nausea, vomiting or diarrhea.  She has no urinary symptoms.  She denies any sick contact, recent travels or known exposure to COVID-19.  Hospital Course:  Stephanie Burch was admitted to the day hospital with sickle cell painful crisis. Patient was treated with IV fluid, weight based IV Dilaudid PCA, IV Toradol, clinician assisted doses as deemed appropriate, and other adjunct therapies per sickle cell pain management protocol. Stephanie Burch showed significant improvement symptomatically, pain improved from 10/10 to 5/10 at the time of discharge. Patient was discharged home in a hemodynamically stable condition. Stephanie Burch will follow-up at the clinic as previously scheduled, continue with home medications as  per prior to admission.  Discharge Instructions We discussed the need for good hydration, monitoring of hydration status, avoidance of heat, cold, stress, and infection triggers. We discussed the need to be compliant with taking Hydrea and other home medications. Stephanie Burch was reminded of the need to seek medical attention immediately if any symptom of bleeding, anemia, or infection occurs.  Discharge Exam: Vitals:   02/18/21 1547 02/18/21 1548  BP: (!) 132/55   Pulse: 96   Resp: 12 12  Temp: 98.1 F (36.7 C)   SpO2: 95% 96%    General appearance: alert, cooperative and no distress Eyes: conjunctivae/corneas clear. PERRL, EOM's intact. Fundi benign. Neck: no adenopathy, no carotid bruit, no JVD, supple, symmetrical, trachea midline and thyroid not enlarged, symmetric, no tenderness/mass/nodules Back: symmetric, no curvature. ROM normal. No CVA tenderness. Resp: clear to auscultation bilaterally Chest wall: no tenderness Cardio: regular rate and rhythm, S1, S2 normal, no murmur, click, rub or gallop GI: soft, non-tender; bowel sounds normal; no masses,  no organomegaly Extremities: extremities normal, atraumatic, no cyanosis or edema Pulses: 2+ and symmetric Skin: Skin color, texture, turgor normal. No rashes or lesions Neurologic: Grossly normal  Discharge Instructions     Diet - low sodium heart healthy   Complete by: As directed    Increase activity slowly   Complete by: As directed       Allergies as of 02/18/2021   No Known Allergies      Medication List     TAKE these medications    folic acid 1 MG tablet Commonly known as: FOLVITE Take 1 tablet (1 mg total) by mouth daily.   gabapentin 300 MG  capsule Commonly known as: NEURONTIN Take 300 mg by mouth 3 (three) times daily.   ibuprofen 200 MG tablet Commonly known as: ADVIL Take 800-1,000 mg by mouth every 6 (six) hours as needed for fever, headache or mild pain.   ondansetron 4 MG tablet Commonly known  as: ZOFRAN TAKE 1 TABLET BY MOUTH EVERY 8 HOURS AS NEEDED FOR NAUSEA AND VOMITING   Oxycodone HCl 20 MG Tabs Take 1 tablet (20 mg total) by mouth every 4 (four) hours as needed for up to 15 days (pain). Start taking on: February 19, 2021 What changed: These instructions start on February 19, 2021. If you are unsure what to do until then, ask your doctor or other care provider.   tiZANidine 4 MG capsule Commonly known as: ZANAFLEX Take 4 mg by mouth 3 (three) times daily.   Xtampza ER 18 MG C12a Generic drug: oxyCODONE ER Take 18 mg by mouth every 12 (twelve) hours. Start taking on: February 22, 2021 What changed: These instructions start on February 22, 2021. If you are unsure what to do until then, ask your doctor or other care provider.       No Known Allergies   Significant Diagnostic Studies: No results found.  Signed:  Jeanann Lewandowsky MD, MHA, FACP, Constance Goltz, CPE   02/18/2021, 3:52 PM

## 2021-02-18 NOTE — Progress Notes (Signed)
Pt admitted to the day hospital for treatment of sickle cell pain crisis. Pt reported pain to bilateral legs rated a 8/10. Pt given IV toradol, placed on Dilaudid PCA, and hydrated with IV fluids. Pt received Oxycodone 20mg . Prescription for home medications sent to pharmacy for pickup tomorrow, reviewed with pt, verbalized understanding. At discharge patient reported pain a 6/10. Pt declined AVS. Pt alert , oriented and ambulatory at discharge.

## 2021-02-18 NOTE — H&P (Signed)
[facility] History and Physical  Stephanie Burch [id number] DOB: 09/12/1980 DOA: 02/18/2021  PCP: Dorena Dew, FNP   Chief Complaint: Sickle cell pain  HPI: Stephanie Burch is a 41 y.o. female with history of sickle cell disease, chronic pain syndrome, opiate dependence and tolerance, morbid obesity and anemia of chronic disease who was recently discharged from the hospital on 02/15/2021, presented to the day hospital this morning with major complaints of significant pain in her lower back and lower extremities that is consistent with her typical sickle cell pain crisis.  Patient claims her pain did not really resolved to her leg which she can manage at home, and on top of that she ran out of her home medications even though she has 15-day supply from 02/04/2021 and had been on admission for 4 days in between yet she says she had run out of her medications.  She rates her pain at 10/10 this morning, constant and throbbing.  She denies any fever, chills, headache, cough, chest pain, shortness of breath, nausea, vomiting or diarrhea.  She has no urinary symptoms.  She denies any sick contact, recent travels or known exposure to COVID-19.  Systemic Review: General: The patient denies anorexia, fever, weight loss Cardiac: Denies chest pain, syncope, palpitations, pedal edema  Respiratory: Denies cough, shortness of breath, wheezing GI: Denies severe indigestion/heartburn, abdominal pain, nausea, vomiting, diarrhea and constipation GU: Denies hematuria, incontinence, dysuria  Musculoskeletal: Denies arthritis  Skin: Denies suspicious skin lesions Neurologic: Denies focal weakness or numbness, change in vision  Past Medical History:  Diagnosis Date   HSV infection    Nausea    Sickle cell anemia (HCC)    Vitamin B12 deficiency 12/2018   Vitamin D deficiency     No past surgical history on file.  No Known Allergies  Family History  Problem Relation Age of Onset    Hypertension Mother    Sickle cell trait Mother    Diabetes Father    Sickle cell trait Father       Prior to Admission medications   Medication Sig Start Date End Date Taking? Authorizing Provider  folic acid (FOLVITE) 1 MG tablet Take 1 tablet (1 mg total) by mouth daily. Patient not taking: Reported on 02/12/2021 11/15/18   Azzie Glatter, FNP  gabapentin (NEURONTIN) 300 MG capsule Take 300 mg by mouth 3 (three) times daily. Patient not taking: Reported on 02/12/2021    [provider]  ibuprofen (ADVIL) 200 MG tablet Take 800-1,000 mg by mouth every 6 (six) hours as needed for fever, headache or mild pain.    [provider]  ondansetron (ZOFRAN) 4 MG tablet TAKE 1 TABLET BY MOUTH EVERY 8 HOURS AS NEEDED FOR NAUSEA AND VOMITING Patient not taking: Reported on 02/12/2021 12/24/20   Dorena Dew, FNP  oxyCODONE ER [facility]) 18 MG C12A Take 18 mg by mouth every 12 (twelve) hours. 01/23/21 02/22/21  Dorena Dew, FNP  Oxycodone HCl 20 MG TABS Take 1 tablet (20 mg total) by mouth every 4 (four) hours as needed for up to 15 days (pain). 02/04/21 02/19/21  Dorena Dew, FNP  tiZANidine (ZANAFLEX) 4 MG capsule Take 4 mg by mouth 3 (three) times daily. Patient not taking: Reported on 02/12/2021 10/07/20   [provider]     Physical Exam: There were no vitals filed for this visit.  General: Alert, awake, afebrile, anicteric, not in obvious distress HEENT: Normocephalic and Atraumatic, Mucous membranes pink  PERRLA; EOM intact; No scleral icterus,                 Nares: Patent, Oropharynx: Clear, Fair Dentition                 Neck: FROM, no cervical lymphadenopathy, thyromegaly, carotid bruit or JVD;  CHEST WALL: No tenderness  CHEST: Normal respiration, clear to auscultation bilaterally  HEART: Regular rate and rhythm; no murmurs rubs or gallops  BACK: No kyphosis or scoliosis; no CVA tenderness  ABDOMEN: Positive Bowel Sounds, soft,  non-tender; no masses, no organomegaly EXTREMITIES: No cyanosis, clubbing, or edema SKIN:  no rash or ulceration  CNS: Alert and Oriented x 4, Nonfocal exam, CN 2-12 intact  Labs on Admission:  Basic Metabolic Panel: Recent Labs  Lab 02/12/21 0935  NA 140  K 3.5  CL 109  CO2 23  GLUCOSE 111*  BUN 8  CREATININE 0.62  CALCIUM 9.0   Liver Function Tests: Recent Labs  Lab 02/12/21 0935  AST 13*  ALT 15  ALKPHOS 69  BILITOT 0.8  PROT 7.2  ALBUMIN 3.6   No results for input(s): LIPASE, AMYLASE in the last 168 hours. No results for input(s): AMMONIA in the last 168 hours. CBC: Recent Labs  Lab 02/12/21 0935  WBC 11.6*  NEUTROABS 7.7  HGB 11.5*  HCT 33.4*  MCV 68.0*  PLT 315   Cardiac Enzymes: No results for input(s): CKTOTAL, CKMB, CKMBINDEX, TROPONINI in the last 168 hours.  BNP (last 3 results) No results for input(s): BNP in the last 8760 hours.  ProBNP (last 3 results) No results for input(s): PROBNP in the last 8760 hours.  CBG: No results for input(s): GLUCAP in the last 168 hours.  Assessment/Plan Principal Problem:   Sickle cell anemia with crisis ([facility])  Admits to the Day Hospital for extended observation IVF 0.45% Saline @ 125 mls/hour Weight based Dilaudid PCA started within 30 minutes of admission IV Toradol 15 mg Q 6 H x 1 doses Acetaminophen 1000 mg x 1 dose Labs: Most recent labs reviewed, no need for new labs today. Monitor vitals very closely, Re-evaluate pain scale every hour 2 L of Oxygen by Vera Patient will be re-evaluated for pain in the context of function and relationship to baseline as care progresses. If no significant relieve from pain (remains above 5/10) will transfer patient to inpatient services for further evaluation and management  Code Status: Full  Family Communication: None  DVT Prophylaxis: Ambulate as tolerated   Time spent: 35 Minutes  Angelica Chessman, MD, MHA, FACP, FAAP, CPE  If 7PM-7AM, please contact  night-coverage [email or web address] 02/18/2021, 9:49 AM

## 2021-02-18 NOTE — Telephone Encounter (Signed)
Transition Care Management Unsuccessful Follow-up Telephone Call  Date of discharge and from where:  02/15/2021 from [facility]  Attempts:  3rd Attempt  Reason for unsuccessful TCM follow-up call:  Unable to reach patient

## 2021-02-18 NOTE — Telephone Encounter (Signed)
Patient called in. Complains of pain in legs rates 8/10. Denied chest pain, abd pain, fever, N/V/D. Wants to come in for treatment. Last took Oxycodone 20mg  at 3am. Pt states she is out of her Oxycodone now, and only has a few pills of Xtampza left. Pt states she was discharged from the hospital on Friday, but pain is not managed with home medications. Pt states she had a negative covid test while admitted, denies exposure to anyone covid positive in the last two weeks and denies flu like symptoms today. Pt states she will use [facility] to and from center today. Dr. Friday notified, approved for pt to be seen in day hospital. Pt made aware, verbalized understanding.

## 2021-03-01 ENCOUNTER — Other Ambulatory Visit: Payer: Self-pay | Admitting: Family Medicine

## 2021-03-01 ENCOUNTER — Telehealth: Payer: Self-pay

## 2021-03-01 DIAGNOSIS — G894 Chronic pain syndrome: Secondary | ICD-10-CM

## 2021-03-01 DIAGNOSIS — D571 Sickle-cell disease without crisis: Secondary | ICD-10-CM

## 2021-03-01 DIAGNOSIS — F1190 Opioid use, unspecified, uncomplicated: Secondary | ICD-10-CM

## 2021-03-01 MED ORDER — OXYCODONE HCL 20 MG PO TABS: 20.0000 mg | ORAL_TABLET | ORAL | 0 refills | Status: DC | PRN

## 2021-03-01 NOTE — Telephone Encounter (Signed)
Oxycodone  °

## 2021-03-01 NOTE — Progress Notes (Signed)
Reviewed PDMP substance reporting system prior to prescribing opiate medications. No inconsistencies noted.  Meds ordered this encounter  Medications   Oxycodone HCl 20 MG TABS    Sig: Take 1 tablet (20 mg total) by mouth every 4 (four) hours as needed for up to 15 days (pain).    Dispense:  90 tablet    Refill:  0    Order Specific Question:   Supervising Provider    Answer:   JEGEDE, OLUGBEMIGA E [[id number]]     Kolbi Altadonna Moore Concha Sudol  APRN, MSN, FNP-C [facility] [address]  [place], [place] [address] [phone]  

## 2021-03-07 ENCOUNTER — Telehealth: Payer: Self-pay | Admitting: *Deleted

## 2021-03-07 NOTE — Telephone Encounter (Signed)
Error

## 2021-03-12 ENCOUNTER — Encounter (HOSPITAL_COMMUNITY): Payer: Medicare Other

## 2021-03-12 ENCOUNTER — Ambulatory Visit: Payer: Self-pay | Admitting: Family Medicine

## 2021-03-13 ENCOUNTER — Encounter (HOSPITAL_COMMUNITY): Payer: Medicare Other

## 2021-03-15 ENCOUNTER — Other Ambulatory Visit: Payer: Self-pay | Admitting: Family Medicine

## 2021-03-15 ENCOUNTER — Other Ambulatory Visit: Payer: Self-pay

## 2021-03-15 ENCOUNTER — Emergency Department (HOSPITAL_COMMUNITY)
Admission: EM | Admit: 2021-03-15 | Discharge: 2021-03-15 | Disposition: A | Discharge status: Home / Self Care | Payer: Medicare Other | Attending: Student | Admitting: Student

## 2021-03-15 ENCOUNTER — Encounter (HOSPITAL_COMMUNITY): Payer: Self-pay

## 2021-03-15 DIAGNOSIS — D5700 Hb-SS disease with crisis, unspecified: Secondary | ICD-10-CM

## 2021-03-15 DIAGNOSIS — Z8616 Personal history of COVID-19: Secondary | ICD-10-CM | POA: Diagnosis not present

## 2021-03-15 DIAGNOSIS — D57219 Sickle-cell/Hb-C disease with crisis, unspecified: Secondary | ICD-10-CM | POA: Diagnosis present

## 2021-03-15 LAB — CBC WITH DIFFERENTIAL/PLATELET
Abs Immature Granulocytes: 0.03 10*3/uL (ref 0.00–0.07)
Basophils Absolute: 0 10*3/uL (ref 0.0–0.1)
Basophils Relative: 0 %
Eosinophils Absolute: 0.1 10*3/uL (ref 0.0–0.5)
Eosinophils Relative: 1 %
HCT: 38.7 % (ref 36.0–46.0)
Hemoglobin: 13.1 g/dL (ref 12.0–15.0)
Immature Granulocytes: 0 %
Lymphocytes Relative: 25 %
Lymphs Abs: 2.5 10*3/uL (ref 0.7–4.0)
MCH: 23.1 pg — ABNORMAL LOW (ref 26.0–34.0)
MCHC: 33.9 g/dL (ref 30.0–36.0)
MCV: 68.1 fL — ABNORMAL LOW (ref 80.0–100.0)
Monocytes Absolute: 0.6 10*3/uL (ref 0.1–1.0)
Monocytes Relative: 6 %
Neutro Abs: 6.6 10*3/uL (ref 1.7–7.7)
Neutrophils Relative %: 68 %
Platelets: 346 10*3/uL (ref 150–400)
RBC: 5.68 MIL/uL — ABNORMAL HIGH (ref 3.87–5.11)
RDW: 16.3 % — ABNORMAL HIGH (ref 11.5–15.5)
WBC: 9.9 10*3/uL (ref 4.0–10.5)
nRBC: 0 % (ref 0.0–0.2)

## 2021-03-15 LAB — COMPREHENSIVE METABOLIC PANEL
ALT: 14 U/L (ref 0–44)
AST: 12 U/L — ABNORMAL LOW (ref 15–41)
Albumin: 4.1 g/dL (ref 3.5–5.0)
Alkaline Phosphatase: 86 U/L (ref 38–126)
Anion gap: 6 (ref 5–15)
BUN: 10 mg/dL (ref 6–20)
CO2: 26 mmol/L (ref 22–32)
Calcium: 9.6 mg/dL (ref 8.9–10.3)
Chloride: 107 mmol/L (ref 98–111)
Creatinine, Ser: 0.55 mg/dL (ref 0.44–1.00)
GFR, Estimated: >60 mL/min (ref >60)
Glucose, Bld: 111 mg/dL — ABNORMAL HIGH (ref 70–99)
Potassium: 4.3 mmol/L (ref 3.5–5.1)
Sodium: 139 mmol/L (ref 135–145)
Total Bilirubin: 0.5 mg/dL (ref 0.3–1.2)
Total Protein: 7.9 g/dL (ref 6.5–8.1)

## 2021-03-15 LAB — I-STAT BETA HCG BLOOD, ED (MC, WL, AP ONLY): I-stat hCG, quantitative: <5 m[IU]/mL (ref <5)

## 2021-03-15 LAB — RETICULOCYTES
Immature Retic Fract: 31.5 % — ABNORMAL HIGH (ref 2.3–15.9)
RBC.: 5.59 MIL/uL — ABNORMAL HIGH (ref 3.87–5.11)
Retic Count, Absolute: 159.9 10*3/uL (ref 19.0–186.0)
Retic Ct Pct: 2.9 % (ref 0.4–3.1)

## 2021-03-15 MED ORDER — HYDROMORPHONE HCL 2 MG/ML IJ SOLN
2.0000 mg | Freq: Once | INTRAMUSCULAR | Status: AC
  Administered 2021-03-15: 2 mg via INTRAVENOUS
  Filled 2021-03-15: qty 1

## 2021-03-15 MED ORDER — LACTATED RINGERS IV BOLUS
1000.0000 mL | Freq: Once | INTRAVENOUS | Status: AC
Start: 2021-03-15 — End: 2021-03-15
  Administered 2021-03-15: 1000 mL via INTRAVENOUS

## 2021-03-15 MED ORDER — KETOROLAC TROMETHAMINE 15 MG/ML IJ SOLN
15.0000 mg | Freq: Once | INTRAMUSCULAR | Status: AC
  Administered 2021-03-15: 15 mg via INTRAVENOUS
  Filled 2021-03-15: qty 1

## 2021-03-15 MED ORDER — HYDROMORPHONE HCL 1 MG/ML IJ SOLN: 0.5000 mg | INTRAMUSCULAR | Status: DC

## 2021-03-15 MED ORDER — ONDANSETRON HCL 4 MG/2ML IJ SOLN
4.0000 mg | Freq: Once | INTRAMUSCULAR | Status: AC
Start: 2021-03-15 — End: 2021-03-15
  Administered 2021-03-15: 4 mg via INTRAVENOUS
  Filled 2021-03-15: qty 2

## 2021-03-15 NOTE — ED Triage Notes (Signed)
Patient c/o sickle cell pain of bilateral lower extremities and let arm x 2-3 days.

## 2021-03-15 NOTE — ED Provider Notes (Signed)
[facility]-EMERGENCY DEPT Provider Note  CSN: [id number] Arrival date & time: 03/15/21 6962  Chief Complaint(s) Sickle Cell Anemia  HPI Stephanie Burch is a 41 y.o. female with PMH sickle cell anemia who presents emergency department for evaluation of upper and lower extremity pain.  Patient recently admitted to [facility] and upon discharge states that her pain was improved, however her pain since returned and now she is complaining of right upper and right lower extremity pain consistent with her sickle cell crisis pain.  She does not have a pain plan she is not a frequent ED utilizer, and has been taking her home 20 mg oxycodone without relief.  Denies chest pain, shortness of breath, fever, nausea, vomiting or other systemic symptoms.  HPI  Past Medical History Past Medical History:  Diagnosis Date   HSV infection    Nausea    Sickle cell anemia (HCC)    Vitamin B12 deficiency 12/2018   Vitamin D deficiency    Patient Active Problem List   Diagnosis Date Noted   Sickle cell disease with crisis (HCC) 09/24/2020   Sickle cell anemia with crisis (HCC) 06/12/2020   Pulmonary edema 11/11/2019   Hypoxia    Poor venous access 11/09/2019   HCAP (healthcare-associated pneumonia) 11/07/2019   Sepsis (HCC) 11/07/2019   Acute kidney injury (HCC) 11/07/2019   History of COVID-19 11/07/2019   COVID-19 virus detected 10/22/2019   Sore throat due to virus 10/22/2019   Urinary frequency 04/20/2019   Vaginal odor 04/20/2019   Chest pain varying with breathing    Fever and chills    Vitamin D deficiency 11/15/2018   Nausea 11/15/2018   Abnormal pulse oximetry    Hypokalemia 09/20/2018   Medication management 09/14/2018   Muscle spasms of both lower extremities 08/25/2018   Subjective visual disturbance of right eye 08/25/2018   Hb-SS disease without crisis (HCC) 04/13/2018   Chronic pain syndrome 03/16/2018   Chronic, continuous use of opioids 03/16/2018    Tachycardia with heart rate 100-120 beats per minute    Sickle cell crisis (HCC) 10/19/2017   Leukocytosis 10/19/2017   Thrombocytopenia (HCC) 10/19/2017   Morbid obesity with BMI of 50.0-59.9, adult (HCC) 10/19/2017   Sickle cell pain crisis (HCC) 10/15/2017   Home Medication(s) Prior to Admission medications   Medication Sig Start Date End Date Taking? Authorizing Provider  folic acid (FOLVITE) 1 MG tablet Take 1 tablet (1 mg total) by mouth daily. Patient not taking: Reported on 02/12/2021 11/15/18   Kallie Locks, FNP  gabapentin (NEURONTIN) 300 MG capsule Take 300 mg by mouth 3 (three) times daily. Patient not taking: Reported on 02/12/2021    [provider]  ibuprofen (ADVIL) 200 MG tablet Take 800-1,000 mg by mouth every 6 (six) hours as needed for fever, headache or mild pain.    [provider]  ondansetron (ZOFRAN) 4 MG tablet TAKE 1 TABLET BY MOUTH EVERY 8 HOURS AS NEEDED FOR NAUSEA AND VOMITING Patient not taking: Reported on 02/12/2021 12/24/20   Massie Maroon, FNP  oxyCODONE ER [facility] ER) 18 MG C12A Take 18 mg by mouth every 12 (twelve) hours. 02/22/21 03/24/21  Quentin Angst, MD  Oxycodone HCl 20 MG TABS Take 1 tablet (20 mg total) by mouth every 4 (four) hours as needed for up to 15 days (pain). 03/05/21 03/20/21  Massie Maroon, FNP  tiZANidine (ZANAFLEX) 4 MG capsule Take 4 mg by mouth 3 (three) times daily. Patient not taking:  Reported on 02/12/2021 10/07/20   [provider]                                                                                                                                    Past Surgical History History reviewed. No pertinent surgical history. Family History Family History  Problem Relation Age of Onset   Hypertension Mother    Sickle cell trait Mother    Diabetes Father    Sickle cell trait Father     Social History Social History   Tobacco Use   Smoking status: Never   Smokeless tobacco:  Never  Vaping Use   Vaping Use: Never used  Substance Use Topics   Alcohol use: Not Currently   Drug use: Never   Allergies Patient has no known allergies.  Review of Systems Review of Systems  Musculoskeletal:  Positive for arthralgias and myalgias.   Physical Exam Vital Signs  I have reviewed the triage vital signs BP (!) 137/116 (BP Location: Left Wrist)    Pulse (!) 103    Temp 98.9 F (37.2 C) (Oral)    Resp 20    Ht 5\' 5"  (1.651 m)    Wt (!) 140.6 kg    LMP 02/20/2021 (Approximate)    SpO2 98%    BMI 51.59 kg/m   Physical Exam Vitals and nursing note reviewed.  Constitutional:      General: She is not in acute distress.    Appearance: She is well-developed.  HENT:     Head: Normocephalic and atraumatic.  Eyes:     Conjunctiva/sclera: Conjunctivae normal.  Cardiovascular:     Rate and Rhythm: Normal rate and regular rhythm.     Heart sounds: No murmur heard. Pulmonary:     Effort: Pulmonary effort is normal. No respiratory distress.     Breath sounds: Normal breath sounds.  Abdominal:     Palpations: Abdomen is soft.     Tenderness: There is no abdominal tenderness.  Musculoskeletal:        General: Tenderness (Right upper extremity, right lower extremity) present. No swelling.     Cervical back: Neck supple.  Skin:    General: Skin is warm and dry.     Capillary Refill: Capillary refill takes less than 2 seconds.  Neurological:     Mental Status: She is alert.  Psychiatric:        Mood and Affect: Mood normal.    ED Results and Treatments Labs (all labs ordered are listed, but only abnormal results are displayed) Labs Reviewed  COMPREHENSIVE METABOLIC PANEL  CBC WITH DIFFERENTIAL/PLATELET  RETICULOCYTES  I-STAT BETA HCG BLOOD, ED ([facility], [facility], [facility] ONLY)  Radiology No results found.  Pertinent labs & imaging results that were available  during my care of the patient were reviewed by me and considered in my medical decision making (see MDM for details).  Medications Ordered in ED Medications  HYDROmorphone (DILAUDID) injection 0.5 mg (has no administration in time range)  HYDROmorphone (DILAUDID) injection 0.5 mg (has no administration in time range)                                                                                                                                     Procedures Procedures  (including critical care time)  Medical Decision Making / ED Course   This patient presents to the ED for concern of sickle cell pain, this involves an extensive number of treatment options, and is a complaint that carries with it a high risk of complications and morbidity.  The differential diagnosis includes sickle cell anemia, acute chest, musculoskeletal pain, chronic pain  MDM: Patient seen emergency room for evaluation of upper and lower extremity pain in the setting of sickle cell anemia.  Physical exam reveals tenderness in the right upper and right lower extremity but is otherwise unremarkable.  Laboratory valuation unremarkable.  Patient given 3 rounds of IV Dilaudid and on third reevaluation patient states that her symptoms have improved enough and she is requesting to go home.  Patient then discharged with outpatient follow-up.  Low suspicion for acute chest.   Additional history obtained:  -External records from outside source obtained and reviewed including: Chart review including previous notes, labs, imaging, consultation notes   Lab Tests: -I ordered, reviewed, and interpreted labs.   The pertinent results include:   Labs Reviewed  COMPREHENSIVE METABOLIC PANEL  CBC WITH DIFFERENTIAL/PLATELET  RETICULOCYTES  I-STAT BETA HCG BLOOD, ED ([facility], [facility], [facility] ONLY)      Medicines ordered and prescription drug management: Meds ordered this encounter  Medications   HYDROmorphone (DILAUDID) injection 0.5 mg    HYDROmorphone (DILAUDID) injection 0.5 mg    -I have reviewed the patients home medicines and have made adjustments as needed  Critical interventions none  Consultations Obtained: I requested consultation with the sickle cell team,  and discussed lab and imaging findings as well as pertinent plan - they recommend: outpatient follow up   Cardiac Monitoring: The patient was maintained on a cardiac monitor.  I personally viewed and interpreted the cardiac monitored which showed an underlying rhythm of: NSR  Social Determinants of Health:  Factors impacting patients care include: none   Reevaluation: After the interventions noted above, I reevaluated the patient and found that they have :improved  Co morbidities that complicate the patient evaluation  Past Medical History:  Diagnosis Date   HSV infection    Nausea    Sickle cell anemia (HCC)    Vitamin B12 deficiency 12/2018   Vitamin D deficiency       Dispostion: I considered admission for  this patient, but her symptoms have improved and she is safe for outpatient follow-up.     Final Clinical Impression(s) / ED Diagnoses Final diagnoses:  None     @PCDICTATION @    , MD 03/15/21 [phone]

## 2021-03-18 ENCOUNTER — Telehealth (HOSPITAL_COMMUNITY): Payer: Self-pay | Admitting: General Practice

## 2021-03-18 ENCOUNTER — Telehealth: Payer: Self-pay

## 2021-03-18 ENCOUNTER — Other Ambulatory Visit: Payer: Self-pay | Admitting: Family Medicine

## 2021-03-18 NOTE — Telephone Encounter (Signed)
Patient called, requesting to come to the day hospital due to pain in the legs rated at 7/10. [facility] does not have the capacity to admit the patient today. Patient told to continue take take prescribed home medications, stay hydrated and possibly call back tomorrow. But if the pain is unbearable, patient can go to the ER. Patient notified, verbalized understanding.

## 2021-03-18 NOTE — Telephone Encounter (Signed)
Oxycodone 20 mg °

## 2021-03-19 ENCOUNTER — Non-Acute Institutional Stay (HOSPITAL_BASED_OUTPATIENT_CLINIC_OR_DEPARTMENT_OTHER)
Admission: AD | Admit: 2021-03-19 | Discharge: 2021-03-19 | Disposition: A | Discharge status: Home / Self Care | Payer: Medicare Other | Source: Ambulatory Visit | Attending: Internal Medicine | Admitting: Internal Medicine

## 2021-03-19 ENCOUNTER — Other Ambulatory Visit: Payer: Self-pay | Admitting: Family Medicine

## 2021-03-19 ENCOUNTER — Emergency Department (HOSPITAL_COMMUNITY)
Admission: EM | Admit: 2021-03-19 | Discharge: 2021-03-19 | Disposition: A | Discharge status: Home / Self Care | Payer: Medicare Other | Attending: Student | Admitting: Student

## 2021-03-19 ENCOUNTER — Encounter (HOSPITAL_COMMUNITY): Payer: Self-pay

## 2021-03-19 ENCOUNTER — Other Ambulatory Visit: Payer: Self-pay

## 2021-03-19 DIAGNOSIS — Z20822 Contact with and (suspected) exposure to covid-19: Secondary | ICD-10-CM | POA: Diagnosis not present

## 2021-03-19 DIAGNOSIS — F1190 Opioid use, unspecified, uncomplicated: Secondary | ICD-10-CM

## 2021-03-19 DIAGNOSIS — G894 Chronic pain syndrome: Secondary | ICD-10-CM | POA: Insufficient documentation

## 2021-03-19 DIAGNOSIS — D5700 Hb-SS disease with crisis, unspecified: Secondary | ICD-10-CM | POA: Insufficient documentation

## 2021-03-19 DIAGNOSIS — F1120 Opioid dependence, uncomplicated: Secondary | ICD-10-CM | POA: Insufficient documentation

## 2021-03-19 DIAGNOSIS — Z8616 Personal history of COVID-19: Secondary | ICD-10-CM | POA: Insufficient documentation

## 2021-03-19 DIAGNOSIS — D57219 Sickle-cell/Hb-C disease with crisis, unspecified: Secondary | ICD-10-CM | POA: Insufficient documentation

## 2021-03-19 DIAGNOSIS — D571 Sickle-cell disease without crisis: Secondary | ICD-10-CM

## 2021-03-19 LAB — COMPREHENSIVE METABOLIC PANEL
ALT: 14 U/L (ref 0–44)
AST: 14 U/L — ABNORMAL LOW (ref 15–41)
Albumin: 4 g/dL (ref 3.5–5.0)
Alkaline Phosphatase: 78 U/L (ref 38–126)
Anion gap: 6 (ref 5–15)
BUN: 9 mg/dL (ref 6–20)
CO2: 24 mmol/L (ref 22–32)
Calcium: 9.2 mg/dL (ref 8.9–10.3)
Chloride: 108 mmol/L (ref 98–111)
Creatinine, Ser: 0.63 mg/dL (ref 0.44–1.00)
GFR, Estimated: >60 mL/min (ref >60)
Glucose, Bld: 119 mg/dL — ABNORMAL HIGH (ref 70–99)
Potassium: 3.9 mmol/L (ref 3.5–5.1)
Sodium: 138 mmol/L (ref 135–145)
Total Bilirubin: 0.6 mg/dL (ref 0.3–1.2)
Total Protein: 8 g/dL (ref 6.5–8.1)

## 2021-03-19 LAB — RETICULOCYTES
Immature Retic Fract: 27.4 % — ABNORMAL HIGH (ref 2.3–15.9)
RBC.: 5.34 MIL/uL — ABNORMAL HIGH (ref 3.87–5.11)
Retic Count, Absolute: 120.7 10*3/uL (ref 19.0–186.0)
Retic Ct Pct: 2.3 % (ref 0.4–3.1)

## 2021-03-19 LAB — CBC WITH DIFFERENTIAL/PLATELET
Abs Immature Granulocytes: 0.01 10*3/uL (ref 0.00–0.07)
Basophils Absolute: 0.1 10*3/uL (ref 0.0–0.1)
Basophils Relative: 1 %
Eosinophils Absolute: 0.2 10*3/uL (ref 0.0–0.5)
Eosinophils Relative: 2 %
HCT: 36.7 % (ref 36.0–46.0)
Hemoglobin: 12.5 g/dL (ref 12.0–15.0)
Immature Granulocytes: 0 %
Lymphocytes Relative: 21 %
Lymphs Abs: 2 10*3/uL (ref 0.7–4.0)
MCH: 23 pg — ABNORMAL LOW (ref 26.0–34.0)
MCHC: 34.1 g/dL (ref 30.0–36.0)
MCV: 67.6 fL — ABNORMAL LOW (ref 80.0–100.0)
Monocytes Absolute: 0.6 10*3/uL (ref 0.1–1.0)
Monocytes Relative: 6 %
Neutro Abs: 6.6 10*3/uL (ref 1.7–7.7)
Neutrophils Relative %: 70 %
Platelets: 300 10*3/uL (ref 150–400)
RBC: 5.43 MIL/uL — ABNORMAL HIGH (ref 3.87–5.11)
RDW: 16.1 % — ABNORMAL HIGH (ref 11.5–15.5)
WBC: 9.4 10*3/uL (ref 4.0–10.5)
nRBC: 0 % (ref 0.0–0.2)

## 2021-03-19 LAB — I-STAT BETA HCG BLOOD, ED (MC, WL, AP ONLY): I-stat hCG, quantitative: <5 m[IU]/mL (ref <5)

## 2021-03-19 LAB — RESP PANEL BY RT-PCR (FLU A&B, COVID) ARPGX2
Influenza A by PCR: NEGATIVE
Influenza B by PCR: NEGATIVE
SARS Coronavirus 2 by RT PCR: NEGATIVE

## 2021-03-19 MED ORDER — KETOROLAC TROMETHAMINE 15 MG/ML IJ SOLN
15.0000 mg | Freq: Once | INTRAMUSCULAR | Status: AC
  Administered 2021-03-19: 15 mg via INTRAVENOUS
  Filled 2021-03-19: qty 1

## 2021-03-19 MED ORDER — KETOROLAC TROMETHAMINE 30 MG/ML IJ SOLN
15.0000 mg | Freq: Once | INTRAMUSCULAR | Status: AC
  Administered 2021-03-19: 15 mg via INTRAVENOUS
  Filled 2021-03-19: qty 1

## 2021-03-19 MED ORDER — ONDANSETRON HCL 4 MG/2ML IJ SOLN
4.0000 mg | Freq: Four times a day (QID) | INTRAMUSCULAR | Status: DC | PRN
  Filled 2021-03-19: qty 2

## 2021-03-19 MED ORDER — DIPHENHYDRAMINE HCL 25 MG PO CAPS
25.0000 mg | ORAL_CAPSULE | ORAL | Status: DC | PRN
  Administered 2021-03-19: 25 mg via ORAL
  Filled 2021-03-19: qty 1

## 2021-03-19 MED ORDER — SODIUM CHLORIDE 0.9% FLUSH: 9.0000 mL | INTRAVENOUS | Status: DC | PRN

## 2021-03-19 MED ORDER — HYDROMORPHONE HCL 2 MG/ML IJ SOLN
2.0000 mg | Freq: Once | INTRAMUSCULAR | Status: AC
  Administered 2021-03-19: 2 mg via INTRAVENOUS
  Filled 2021-03-19: qty 1

## 2021-03-19 MED ORDER — OXYCODONE HCL 20 MG PO TABS: 20.0000 mg | ORAL_TABLET | ORAL | 0 refills | Status: DC | PRN

## 2021-03-19 MED ORDER — LACTATED RINGERS IV BOLUS
1000.0000 mL | Freq: Once | INTRAVENOUS | Status: AC
  Administered 2021-03-19: 1000 mL via INTRAVENOUS

## 2021-03-19 MED ORDER — SODIUM CHLORIDE 0.45 % IV SOLN: INTRAVENOUS | Status: DC

## 2021-03-19 MED ORDER — HYDROMORPHONE 1 MG/ML IV SOLN
INTRAVENOUS | Status: DC
  Administered 2021-03-19: 30 mg via INTRAVENOUS
  Administered 2021-03-19: 12.4 mg via INTRAVENOUS
  Filled 2021-03-19: qty 30

## 2021-03-19 MED ORDER — HYDROMORPHONE HCL 1 MG/ML IJ SOLN: 0.5000 mg | INTRAMUSCULAR | Status: DC

## 2021-03-19 MED ORDER — ACETAMINOPHEN 500 MG PO TABS
1000.0000 mg | ORAL_TABLET | Freq: Once | ORAL | Status: AC
  Administered 2021-03-19: 1000 mg via ORAL
  Filled 2021-03-19: qty 2

## 2021-03-19 MED ORDER — HYDROMORPHONE HCL 2 MG/ML IJ SOLN
2.0000 mg | INTRAMUSCULAR | Status: AC
  Administered 2021-03-19 (×2): 2 mg via INTRAVENOUS
  Filled 2021-03-19 (×2): qty 1

## 2021-03-19 MED ORDER — NALOXONE HCL 0.4 MG/ML IJ SOLN: 0.4000 mg | INTRAMUSCULAR | Status: DC | PRN

## 2021-03-19 NOTE — ED Provider Notes (Signed)
[place] DEPT Provider Note  CSN: [id number] Arrival date & time: 03/19/21 0719  Chief Complaint(s) Sickle Cell Pain Crisis  HPI Stephanie Burch is a 41 y.o. female with PMH sickle cell anemia who presents emergency department for evaluation of lower extremity pain.  I personally evaluated this patient 4 days ago for similar symptoms.  She states that after receiving pain medication in the emergency department on 03/15/2020 she had a single day of relief but then her pain or back despite taking her home oxycodone.  She spoke with her sickle cell coordinator yesterday and there is plans to resign her pain agreement.  She presents today as her pain medication at home is not controlling her pain.  She denies fever, chest pain, shortness of breath, abdominal pain, nausea, vomiting or other systemic symptoms.   Sickle Cell Pain Crisis  Past Medical History Past Medical History:  Diagnosis Date   HSV infection    Nausea    Sickle cell anemia ([place])    Vitamin B12 deficiency 12/2018   Vitamin D deficiency    Patient Active Problem List   Diagnosis Date Noted   Sickle cell disease with crisis ([place]) 09/24/2020   Sickle cell anemia with crisis ([place]) 06/12/2020   Pulmonary edema 11/11/2019   Hypoxia    Poor venous access 11/09/2019   HCAP (healthcare-associated pneumonia) 11/07/2019   Sepsis ([place]) 11/07/2019   Acute kidney injury ([place]) 11/07/2019   History of COVID-19 11/07/2019   COVID-19 virus detected 10/22/2019   Sore throat due to virus 10/22/2019   Urinary frequency 04/20/2019   Vaginal odor 04/20/2019   Chest pain varying with breathing    Fever and chills    Vitamin D deficiency 11/15/2018   Nausea 11/15/2018   Abnormal pulse oximetry    Hypokalemia 09/20/2018   Medication management 09/14/2018   Muscle spasms of both lower extremities 08/25/2018   Subjective visual disturbance of right eye 08/25/2018   Hb-SS disease without crisis ([place])  04/13/2018   Chronic pain syndrome 03/16/2018   Chronic, continuous use of opioids 03/16/2018   Tachycardia with heart rate 100-120 beats per minute    Sickle cell crisis ([place]) 10/19/2017   Leukocytosis 10/19/2017   Thrombocytopenia ([place]) 10/19/2017   Morbid obesity with BMI of 50.0-59.9, adult ([place]) 10/19/2017   Sickle cell pain crisis ([place]) 10/15/2017   Home Medication(s) Prior to Admission medications   Medication Sig Start Date End Date Taking? Authorizing Provider  folic acid (FOLVITE) 1 MG tablet Take 1 tablet (1 mg total) by mouth daily. Patient not taking: Reported on 02/12/2021 11/15/18   Azzie Glatter, FNP  gabapentin (NEURONTIN) 300 MG capsule Take 300 mg by mouth 3 (three) times daily. Patient not taking: Reported on 02/12/2021    [provider]  ibuprofen (ADVIL) 200 MG tablet Take 800-1,000 mg by mouth every 6 (six) hours as needed for fever, headache or mild pain.    [provider]  ondansetron (ZOFRAN) 4 MG tablet TAKE 1 TABLET BY MOUTH EVERY 8 HOURS AS NEEDED FOR NAUSEA AND VOMITING Patient not taking: Reported on 02/12/2021 12/24/20   Dorena Dew, FNP  oxyCODONE ER [facility] ER) 18 MG C12A Take 18 mg by mouth every 12 (twelve) hours. 02/22/21 03/24/21  Tresa Garter, MD  Oxycodone HCl 20 MG TABS Take 1 tablet (20 mg total) by mouth every 4 (four) hours as needed for up to 15 days (pain). 03/05/21 03/20/21  Dorena Dew, FNP  tiZANidine (ZANAFLEX)  4 MG capsule Take 4 mg by mouth 3 (three) times daily. Patient not taking: Reported on 02/12/2021 10/07/20   [provider]                                                                                                                                    Past Surgical History History reviewed. No pertinent surgical history. Family History Family History  Problem Relation Age of Onset   Hypertension Mother    Sickle cell trait Mother    Diabetes Father    Sickle cell trait Father      Social History Social History   Tobacco Use   Smoking status: Never   Smokeless tobacco: Never  Vaping Use   Vaping Use: Never used  Substance Use Topics   Alcohol use: Not Currently   Drug use: Never   Allergies Patient has no known allergies.  Review of Systems Review of Systems  Musculoskeletal:  Positive for arthralgias and myalgias.   Physical Exam Vital Signs  I have reviewed the triage vital signs BP (!) 147/97    Pulse 90    Temp 98.9 F (37.2 C) (Oral)    Resp 18    Ht 5\' 5"  (1.651 m)    Wt (!) 140.6 kg    LMP 02/20/2021 (Approximate)    SpO2 96%    BMI 51.59 kg/m   Physical Exam Vitals and nursing note reviewed.  Constitutional:      General: She is not in acute distress.    Appearance: She is well-developed.  HENT:     Head: Normocephalic and atraumatic.  Eyes:     Conjunctiva/sclera: Conjunctivae normal.  Cardiovascular:     Rate and Rhythm: Normal rate and regular rhythm.     Heart sounds: No murmur heard. Pulmonary:     Effort: Pulmonary effort is normal. No respiratory distress.     Breath sounds: Normal breath sounds.  Abdominal:     Palpations: Abdomen is soft.     Tenderness: There is no abdominal tenderness.  Musculoskeletal:        General: Tenderness (Right and left lower extremity) present. No swelling.     Cervical back: Neck supple.  Skin:    General: Skin is warm and dry.     Capillary Refill: Capillary refill takes less than 2 seconds.  Neurological:     Mental Status: She is alert.  Psychiatric:        Mood and Affect: Mood normal.    ED Results and Treatments Labs (all labs ordered are listed, but only abnormal results are displayed) Labs Reviewed  COMPREHENSIVE METABOLIC PANEL - Abnormal; Notable for the following components:      Result Value   Glucose, Bld 119 (*)    AST 14 (*)    All other components within normal limits  CBC WITH DIFFERENTIAL/PLATELET - Abnormal; Notable for the following components:   RBC 5.43  (*)  MCV 67.6 (*)    MCH 23.0 (*)    RDW 16.1 (*)    All other components within normal limits  RETICULOCYTES - Abnormal; Notable for the following components:   RBC. 5.34 (*)    Immature Retic Fract 27.4 (*)    All other components within normal limits  I-STAT BETA HCG BLOOD, ED ([facility], [facility], [facility] ONLY)                                                                                                                          Radiology No results found.  Pertinent labs & imaging results that were available during my care of the patient were reviewed by me and considered in my medical decision making (see MDM for details).  Medications Ordered in ED Medications  HYDROmorphone (DILAUDID) injection 2 mg (has no administration in time range)  HYDROmorphone (DILAUDID) injection 2 mg (2 mg Intravenous Given 03/19/21 0958)  ketorolac (TORADOL) 15 MG/ML injection 15 mg (15 mg Intravenous Given 03/19/21 0958)  lactated ringers bolus 1,000 mL (1,000 mLs Intravenous New Bag/Given 03/19/21 [id number])                                                                                                                                     Procedures Procedures  (including critical care time)  Medical Decision Making / ED Course   This patient presents to the ED for concern of sickle cell pain, this involves an extensive number of treatment options, and is a complaint that carries with it a high risk of complications and morbidity.  The differential diagnosis includes sickle cell crisis, chronic pain, fracture, septic joint  MDM: Patient seen emergency department for evaluation of leg pain in the setting of sickle cell.  Physical exam with tenderness to bilateral lower extremities but is otherwise unremarkable.  Laboratory evaluation unremarkable.  Patient given 3 rounds of IV Dilaudid and pain improving.  [place] Hollis of the inpatient sickle cell coverage team was consulted who recommended transfer to the sickle cell  clinic.  Patient rehydrated and given Toradol and transferred to sickle cell.   Additional history obtained:  -External records from outside source obtained and reviewed including: Chart review including previous notes, labs, imaging, consultation notes   Lab Tests: -I ordered, reviewed, and interpreted labs.   The pertinent results include:   Labs Reviewed  COMPREHENSIVE METABOLIC PANEL - Abnormal;  Notable for the following components:      Result Value   Glucose, Bld 119 (*)    AST 14 (*)    All other components within normal limits  CBC WITH DIFFERENTIAL/PLATELET - Abnormal; Notable for the following components:   RBC 5.43 (*)    MCV 67.6 (*)    MCH 23.0 (*)    RDW 16.1 (*)    All other components within normal limits  RETICULOCYTES - Abnormal; Notable for the following components:   RBC. 5.34 (*)    Immature Retic Fract 27.4 (*)    All other components within normal limits  I-STAT BETA HCG BLOOD, ED ([facility], [facility], [facility] ONLY)     Medicines ordered and prescription drug management: Meds ordered this encounter  Medications   DISCONTD: HYDROmorphone (DILAUDID) injection 0.5 mg   DISCONTD: HYDROmorphone (DILAUDID) injection 0.5 mg   HYDROmorphone (DILAUDID) injection 2 mg   ketorolac (TORADOL) 15 MG/ML injection 15 mg   lactated ringers bolus 1,000 mL   HYDROmorphone (DILAUDID) injection 2 mg    -I have reviewed the patients home medicines and have made adjustments as needed  Critical interventions none  Consultations Obtained: I requested consultation with the sickle cell team,  and discussed lab and imaging findings as well as pertinent plan - they recommend: transfer to sickle cell clinic   Cardiac Monitoring: The patient was maintained on a cardiac monitor.  I personally viewed and interpreted the cardiac monitored which showed an underlying rhythm of: NSR  Social Determinants of Health:  Factors impacting patients care include: opioid  dependence   Reevaluation: After the interventions noted above, I reevaluated the patient and found that they have :improved  Co morbidities that complicate the patient evaluation  Past Medical History:  Diagnosis Date   HSV infection    Nausea    Sickle cell anemia ([place])    Vitamin B12 deficiency 12/2018   Vitamin D deficiency       Dispostion: I considered admission for this patient, and after consultation with the sickle cell team, she will be transferred to the sickle cell clinic.     Final Clinical Impression(s) / ED Diagnoses Final diagnoses:  None     @PCDICTATION @    Teressa Lower, MD 03/19/21 1217

## 2021-03-19 NOTE — H&P (Signed)
[facility] History and Physical   Date: 03/19/2021  Patient name: Stephanie Burch Medical record number: [id number] Date of birth: [birth year] Age: 41 y.o. Gender: female PCP: Massie Maroon, FNP  Attending physician: Quentin Angst, MD  Chief Complaint: Sickle cell pain   History of Present Illness: Stephanie Burch is a 41 year old female with a medical history significant for sickle cell disease, chronic pain syndrome, opiate dependence and tolerance, frequent ER utilization, morbid obesity, and anemia of chronic disease presents with complaints of low back and lower extremity pain that is consistent with her typical sickle cell pain crisis.  Patient was treated and evaluated in the emergency department this a.m.  I agreed with ER provider that patient was appropriate to transition to the sickle cell day clinic for further pain management and extended observation.  Patient arrived in stable condition.  She says that her pain intensity is 8/10 and characterizes it as constant and throbbing.  Patient states that pain has been escalating and not relieved by home Xtampza and oxycodone.  Patient attributes pain crisis to increased stressors and changes in weather.  She has had no sick contacts, recent travel, or known exposure to COVID-19.  She denies headache, chest pain, or shortness of breath.  No urinary symptoms, nausea, vomiting, or diarrhea.  Meds: Medications Prior to Admission  Medication Sig Dispense Refill Last Dose   folic acid (FOLVITE) 1 MG tablet Take 1 tablet (1 mg total) by mouth daily. (Patient not taking: Reported on 02/12/2021) 30 tablet 11    gabapentin (NEURONTIN) 300 MG capsule Take 300 mg by mouth 3 (three) times daily. (Patient not taking: Reported on 02/12/2021)      ibuprofen (ADVIL) 200 MG tablet Take 800-1,000 mg by mouth every 6 (six) hours as needed for fever, headache or mild pain.      ondansetron (ZOFRAN) 4 MG tablet TAKE 1 TABLET BY MOUTH EVERY 8  HOURS AS NEEDED FOR NAUSEA AND VOMITING (Patient not taking: Reported on 02/12/2021) 20 tablet 0    oxyCODONE ER (XTAMPZA ER) 18 MG C12A Take 18 mg by mouth every 12 (twelve) hours. 60 capsule 0    Oxycodone HCl 20 MG TABS Take 1 tablet (20 mg total) by mouth every 4 (four) hours as needed for up to 15 days (pain). 90 tablet 0    tiZANidine (ZANAFLEX) 4 MG capsule Take 4 mg by mouth 3 (three) times daily. (Patient not taking: Reported on 02/12/2021)       Allergies: Patient has no known allergies. Past Medical History:  Diagnosis Date   HSV infection    Nausea    Sickle cell anemia (HCC)    Vitamin B12 deficiency 12/2018   Vitamin D deficiency    No past surgical history on file. Family History  Problem Relation Age of Onset   Hypertension Mother    Sickle cell trait Mother    Diabetes Father    Sickle cell trait Father    Social History   Socioeconomic History   Marital status: Single    Spouse name: Not on file   Number of children: Not on file   Years of education: Not on file   Highest education level: Not on file  Occupational History   Not on file  Tobacco Use   Smoking status: Never   Smokeless tobacco: Never  Vaping Use   Vaping Use: Never used  Substance and Sexual Activity   Alcohol use: Not Currently   Drug use: Never  Sexual activity: Yes    Birth control/protection: None  Other Topics Concern   Not on file  Social History Narrative   Not on file   Social Determinants of Health   Financial Resource Strain: Not on file  Food Insecurity: Not on file  Transportation Needs: Not on file  Physical Activity: Not on file  Stress: Not on file  Social Connections: Not on file  Intimate Partner Violence: Not on file   Review of Systems  Constitutional:  Negative for chills and fever.  HENT: Negative.    Eyes: Negative.   Respiratory: Negative.    Gastrointestinal: Negative.   Genitourinary: Negative.   Musculoskeletal:  Positive for back pain and  joint pain.  Skin: Negative.   Neurological: Negative.   Psychiatric/Behavioral: Negative.     Physical Exam: Last menstrual period 02/20/2021. Physical Exam Constitutional:      Appearance: Normal appearance. She is obese.  Eyes:     Pupils: Pupils are equal, round, and reactive to light.  Cardiovascular:     Rate and Rhythm: Normal rate and regular rhythm.     Pulses: Normal pulses.  Pulmonary:     Effort: Pulmonary effort is normal.  Neurological:     General: No focal deficit present.     Mental Status: She is alert. Mental status is at baseline.  Psychiatric:        Mood and Affect: Mood normal.        Thought Content: Thought content normal.        Judgment: Judgment normal.     Lab results: Results for orders placed or performed during the hospital encounter of 03/19/21 (from the past 24 hour(s))  Comprehensive metabolic panel     Status: Abnormal   Collection Time: 03/19/21  8:23 AM  Result Value Ref Range   Sodium 138 135 - 145 mmol/L   Potassium 3.9 3.5 - 5.1 mmol/L   Chloride 108 98 - 111 mmol/L   CO2 24 22 - 32 mmol/L   Glucose, Bld 119 (H) 70 - 99 mg/dL   BUN 9 6 - 20 mg/dL   Creatinine, Ser 3.71 0.44 - 1.00 mg/dL   Calcium 9.2 8.9 - 69.6 mg/dL   Total Protein 8.0 6.5 - 8.1 g/dL   Albumin 4.0 3.5 - 5.0 g/dL   AST 14 (L) 15 - 41 U/L   ALT 14 0 - 44 U/L   Alkaline Phosphatase 78 38 - 126 U/L   Total Bilirubin 0.6 0.3 - 1.2 mg/dL   GFR, Estimated >78 >93 mL/min   Anion gap 6 5 - 15  CBC with Differential     Status: Abnormal   Collection Time: 03/19/21  8:23 AM  Result Value Ref Range   WBC 9.4 4.0 - 10.5 K/uL   RBC 5.43 (H) 3.87 - 5.11 MIL/uL   Hemoglobin 12.5 12.0 - 15.0 g/dL   HCT 81.0 17.5 - 10.2 %   MCV 67.6 (L) 80.0 - 100.0 fL   MCH 23.0 (L) 26.0 - 34.0 pg   MCHC 34.1 30.0 - 36.0 g/dL   RDW 58.5 (H) 27.7 - 82.4 %   Platelets 300 150 - 400 K/uL   nRBC 0.0 0.0 - 0.2 %   Neutrophils Relative % 70 %   Neutro Abs 6.6 1.7 - 7.7 K/uL    Lymphocytes Relative 21 %   Lymphs Abs 2.0 0.7 - 4.0 K/uL   Monocytes Relative 6 %   Monocytes Absolute 0.6 0.1 - 1.0 K/uL  Eosinophils Relative 2 %   Eosinophils Absolute 0.2 0.0 - 0.5 K/uL   Basophils Relative 1 %   Basophils Absolute 0.1 0.0 - 0.1 K/uL   Immature Granulocytes 0 %   Abs Immature Granulocytes 0.01 0.00 - 0.07 K/uL  Reticulocytes     Status: Abnormal   Collection Time: 03/19/21  8:23 AM  Result Value Ref Range   Retic Ct Pct 2.3 0.4 - 3.1 %   RBC. 5.34 (H) 3.87 - 5.11 MIL/uL   Retic Count, Absolute 120.7 19.0 - 186.0 K/uL   Immature Retic Fract 27.4 (H) 2.3 - 15.9 %  I-Stat beta hCG blood, ED     Status: None   Collection Time: 03/19/21 10:51 AM  Result Value Ref Range   I-stat hCG, quantitative <5.0 <5 mIU/mL   Comment 3            Imaging results:  No results found.   Assessment & Plan: Patient admitted to sickle cell day infusion center for management of pain crisis.  Patient is opiate tolerant Initiate IV dilaudid PCA. Settings of  IV fluids, 0.45% saline at 100 mL/h Toradol 15 mg IV times one dose Tylenol 1000 mg by mouth times one dose Review CBC with differential, complete metabolic panel, and reticulocytes as results become available. Pain intensity will be reevaluated in context of functioning and relationship to baseline as care progresses If pain intensity remains elevated and/or sudden change in hemodynamic stability transition to inpatient services for higher level of care.   Nolon Nations  APRN, MSN, FNP-C Patient Care [facility] [address] [place], [place] [address] [phone]  03/19/2021, 12:33 PM

## 2021-03-19 NOTE — Discharge Summary (Signed)
[facility] Discharge Summary   Patient ID: Stephanie Burch MRN: [id number] DOB/AGE: [birth year] 41 y.o.  Admit date: 03/19/2021 Discharge date: 03/19/2021  Primary Care Physician:  Massie Maroon, FNP  Admission Diagnoses:  Principal Problem:   Sickle cell pain crisis [facility])   Discharge Medications:  Allergies as of 03/19/2021   No Known Allergies      Medication List     TAKE these medications    folic acid 1 MG tablet Commonly known as: FOLVITE Take 1 tablet (1 mg total) by mouth daily.   gabapentin 300 MG capsule Commonly known as: NEURONTIN Take 300 mg by mouth 3 (three) times daily.   ibuprofen 200 MG tablet Commonly known as: ADVIL Take 800-1,000 mg by mouth every 6 (six) hours as needed for fever, headache or mild pain.   ondansetron 4 MG tablet Commonly known as: ZOFRAN TAKE 1 TABLET BY MOUTH EVERY 8 HOURS AS NEEDED FOR NAUSEA AND VOMITING   Oxycodone HCl 20 MG Tabs Take 1 tablet (20 mg total) by mouth every 4 (four) hours as needed for up to 15 days (pain).   tiZANidine 4 MG capsule Commonly known as: ZANAFLEX Take 4 mg by mouth 3 (three) times daily.   Xtampza ER 18 MG C12a Generic drug: oxyCODONE ER Take 18 mg by mouth every 12 (twelve) hours.         Consults:  None  Significant Diagnostic Studies:  No results found.  History of present illness:  Stephanie Burch is a 41 year old female with a medical history significant for sickle cell disease, chronic pain syndrome, opiate dependence and tolerance, frequent ER utilization, morbid obesity, and anemia of chronic disease presents with complaints of low back and lower extremity pain that is consistent with her typical sickle cell pain crisis.  Patient was treated and evaluated in the emergency department this a.m.  I agreed with ER provider that patient was appropriate to transition to the sickle cell day clinic for further pain management and extended observation.  Patient arrived  in stable condition.  She says that her pain intensity is 8/10 and characterizes it as constant and throbbing.  Patient states that pain has been escalating and not relieved by home Xtampza and oxycodone.  Patient attributes pain crisis to increased stressors and changes in weather.  She has had no sick contacts, recent travel, or known exposure to COVID-19.  She denies headache, chest pain, or shortness of breath.  No urinary symptoms, nausea, vomiting, or diarrhea.  [facility] Course: Patient admitted to sickle cell day infusion clinic for management of sickle cell pain crisis. Reviewed all laboratory values, largely consistent with patient's baseline. Pain managed with IV Dilaudid PCA IV fluids, 0.45% saline at 100 mL/h Toradol 15 mg IV x1 Tylenol 1000 mg x 1 Pain intensity decreased throughout admission.  Patient will be discharged home.  She is alert, oriented, and ambulating without assistance.  She is currently in a hemodynamically stable condition.  Physical Exam at Discharge:  BP 137/65 (BP Location: Left Arm)    Pulse 98    Temp 98.4 F (36.9 C) (Temporal)    Resp 16    LMP 02/20/2021 (Approximate)    SpO2 95%  Physical Exam Constitutional:      Appearance: She is obese.  Eyes:     Pupils: Pupils are equal, round, and reactive to light.  Cardiovascular:     Rate and Rhythm: Normal rate and regular rhythm.     Pulses:  Normal pulses.  Pulmonary:     Effort: Pulmonary effort is normal.  Abdominal:     General: Bowel sounds are normal.  Skin:    General: Skin is warm.  Neurological:     General: No focal deficit present.     Mental Status: She is alert. Mental status is at baseline.  Psychiatric:        Mood and Affect: Mood normal.        Behavior: Behavior normal.        Thought Content: Thought content normal.        Judgment: Judgment normal.    Discharge instructions: Resume all home medications.   Follow up with PCP as previously  scheduled.    Discussed the importance of drinking 64 ounces of water daily, dehydration of red blood cells may lead further sickling.   Avoid all stressors that precipitate sickle cell pain crisis.     The patient was given clear instructions to go to ER or return to medical center if symptoms do not improve, worsen or new problems develop.    Disposition at Discharge: Discharge disposition: 01-Home or Self Care       Discharge Orders: Discharge Instructions     Discharge patient   Complete by: As directed    Discharge disposition: 01-Home or Self Care   Discharge patient date: 03/19/2021       Condition at Discharge:   Stable  Time spent on Discharge:  Greater than 30 minutes.  Signed: Nolon Nations  APRN, MSN, FNP-C Patient Care [facility] [address] [place], [place] [address] [phone]  03/19/2021, 4:18 PM

## 2021-03-19 NOTE — ED Triage Notes (Signed)
Patient c/o sickle cell pain of bilateral lower extremities. Patient was seen 4 days ago for the same. Patient denies any chest pain or SOB.

## 2021-03-19 NOTE — ED Provider Triage Note (Addendum)
Emergency Medicine Provider Triage Evaluation Note  Stephanie Burch , a 41 y.o. female  was evaluated in triage.  Pt complains of sickle cell pain crisis onset today.  She is followed by Julianne Handler, nurse practitioner.  Her pain is localized to her bilateral lower legs and it is typical for her.  Denies recent injury or fall.  Has tried her 20 mg oxycodone and Xtampza with no relief for her symptoms. Denies chest pain, shortness of breath, abdominal pain, nausea, vomiting, fever, chills.   Per patient chart review: Patient was evaluated in the ED on 03/15/2021 for similar concerns.  Review of Systems  Positive: As per HPI above Negative: Chest pain, shortness of breath  Physical Exam  BP (!) 150/115 (BP Location: Left Arm)    Pulse (!) 103    Temp 98.2 F (36.8 C) (Oral)    Resp 16    Ht 5\' 5"  (1.651 m)    Wt (!) 140.6 kg    LMP 02/20/2021 (Approximate)    SpO2 98%    BMI 51.59 kg/m  Gen:   Awake, no distress, tearful on exam Resp:  Normal effort  MSK:   Moves extremities without difficulty  Other:  Mild TTP to BLE. No overlying skin changes or increased warmth. Pedal pulses intact.  Medical Decision Making  Medically screening exam initiated at 7:44 AM.  Appropriate orders placed.  Floyd Osterlund was informed that the remainder of the evaluation will be completed by another provider, this initial triage assessment does not replace that evaluation, and the importance of remaining in the ED until their evaluation is complete.   Loye Vento A, PA-C 03/19/21 0748    Giovanna Kemmerer A, PA-C 03/19/21 03/21/21

## 2021-03-19 NOTE — Progress Notes (Addendum)
Pt admitted to the day hospital for treatment of sickle cell pain crisis. Pt reported pain to bilateral legs rated 8/10. Pt given PO Benadryl and Tylenol, IV toradol, placed on Dilaudid PCA, and hydrated with IV fluids. At discharge patient reported pain a 5/10. Pt declined AVS. Pt informed that Oxycodone refill was sent to pharmacy by provider Arm[place] Hollis FNP, and pt can pick it up today, verbalized understanding. Pt alert , oriented and ambulatory at discharge; however pt taken in wheelchair to [facility] lobby to gather belongings from vehicle and states she will use [employer] as ride home.

## 2021-03-19 NOTE — Progress Notes (Signed)
Reviewed PDMP substance reporting system prior to prescribing opiate medications. No inconsistencies noted.  Meds ordered this encounter  Medications   Oxycodone HCl 20 MG TABS    Sig: Take 1 tablet (20 mg total) by mouth every 4 (four) hours as needed for up to 15 days (pain).    Dispense:  90 tablet    Refill:  0    Order Specific Question:   Supervising Provider    Answer:   JEGEDE, OLUGBEMIGA E [[id number]]     Stephanie Wanamaker Moore [name]  APRN, MSN, FNP-C Patient Care Center [facility] [address]  [place], [place] [address] [phone]  

## 2021-03-22 ENCOUNTER — Telehealth: Payer: Self-pay

## 2021-03-22 NOTE — Telephone Encounter (Signed)
Xtampza (call early)

## 2021-03-23 ENCOUNTER — Other Ambulatory Visit: Payer: Self-pay | Admitting: Family Medicine

## 2021-03-23 DIAGNOSIS — D571 Sickle-cell disease without crisis: Secondary | ICD-10-CM

## 2021-03-23 DIAGNOSIS — G894 Chronic pain syndrome: Secondary | ICD-10-CM

## 2021-03-23 DIAGNOSIS — F1190 Opioid use, unspecified, uncomplicated: Secondary | ICD-10-CM

## 2021-03-23 MED ORDER — XTAMPZA ER 18 MG PO C12A: 18.0000 mg | EXTENDED_RELEASE_CAPSULE | Freq: Two times a day (BID) | ORAL | 0 refills | Status: DC

## 2021-03-23 NOTE — Progress Notes (Signed)
Reviewed PDMP substance reporting system prior to prescribing opiate medications. No inconsistencies noted.  °Meds ordered this encounter  °Medications  ° oxyCODONE ER (XTAMPZA ER) 18 MG C12A  °  Sig: Take 18 mg by mouth every 12 (twelve) hours.  °  Dispense:  60 capsule  °  Refill:  0  °  Order Specific Question:   Supervising Provider  °  Answer:   JEGEDE, OLUGBEMIGA E [[id number]]  ° Kathleen Tamm Moore Romilda Proby  APRN, MSN, FNP-C °Patient Care Center °[facility] °[address]  °[place], [place] [address] °[phone] ° °

## 2021-03-25 ENCOUNTER — Telehealth: Payer: Self-pay

## 2021-03-25 ENCOUNTER — Ambulatory Visit: Payer: Self-pay | Admitting: Clinical

## 2021-03-25 ENCOUNTER — Other Ambulatory Visit: Payer: Self-pay | Admitting: Family Medicine

## 2021-03-25 DIAGNOSIS — G894 Chronic pain syndrome: Secondary | ICD-10-CM

## 2021-03-25 DIAGNOSIS — D571 Sickle-cell disease without crisis: Secondary | ICD-10-CM

## 2021-03-25 DIAGNOSIS — F1190 Opioid use, unspecified, uncomplicated: Secondary | ICD-10-CM

## 2021-03-25 MED ORDER — XTAMPZA ER 18 MG PO C12A: 18.0000 mg | EXTENDED_RELEASE_CAPSULE | Freq: Two times a day (BID) | ORAL | 0 refills | Status: AC

## 2021-03-25 NOTE — Progress Notes (Signed)
Reviewed PDMP substance reporting system prior to prescribing opiate medications. No inconsistencies noted.  °Meds ordered this encounter  °Medications  ° oxyCODONE ER (XTAMPZA ER) 18 MG C12A  °  Sig: Take 18 mg by mouth every 12 (twelve) hours.  °  Dispense:  60 capsule  °  Refill:  0  °  Order Specific Question:   Supervising Provider  °  Answer:   JEGEDE, OLUGBEMIGA E [[id number]]  ° Hettie Roselli Moore Kien Mirsky  APRN, MSN, FNP-C °Patient Care Center °[facility] °[address]  °[place], [place] [address] °[phone] ° °

## 2021-03-25 NOTE — Telephone Encounter (Signed)
Xtampza ° °

## 2021-03-29 ENCOUNTER — Telehealth: Payer: Self-pay

## 2021-03-29 NOTE — Telephone Encounter (Signed)
Oxycodone 20 mg °

## 2021-04-01 ENCOUNTER — Other Ambulatory Visit: Payer: Self-pay | Admitting: Family Medicine

## 2021-04-01 DIAGNOSIS — G894 Chronic pain syndrome: Secondary | ICD-10-CM

## 2021-04-01 DIAGNOSIS — F1190 Opioid use, unspecified, uncomplicated: Secondary | ICD-10-CM

## 2021-04-01 DIAGNOSIS — D571 Sickle-cell disease without crisis: Secondary | ICD-10-CM

## 2021-04-01 MED ORDER — OXYCODONE HCL 20 MG PO TABS: 20.0000 mg | ORAL_TABLET | ORAL | 0 refills | Status: DC | PRN

## 2021-04-01 NOTE — Progress Notes (Signed)
Reviewed PDMP substance reporting system prior to prescribing opiate medications. No inconsistencies noted.  Meds ordered this encounter  Medications   Oxycodone HCl 20 MG TABS    Sig: Take 1 tablet (20 mg total) by mouth every 4 (four) hours as needed for up to 15 days (pain).    Dispense:  90 tablet    Refill:  0    Order Specific Question:   Supervising Provider    Answer:   JEGEDE, OLUGBEMIGA E [[id number]]     Stephanie Meister Moore Ayona Yniguez  APRN, MSN, FNP-C Patient Care Center [facility] [address]  [place], [place] [address] [phone]  

## 2021-04-02 ENCOUNTER — Emergency Department (HOSPITAL_COMMUNITY): Payer: Medicare Other

## 2021-04-02 ENCOUNTER — Other Ambulatory Visit: Payer: Self-pay

## 2021-04-02 ENCOUNTER — Emergency Department (HOSPITAL_COMMUNITY)
Admission: EM | Admit: 2021-04-02 | Discharge: 2021-04-02 | Disposition: A | Discharge status: Home / Self Care | Payer: Medicare Other | Attending: Emergency Medicine | Admitting: Emergency Medicine

## 2021-04-02 ENCOUNTER — Encounter (HOSPITAL_COMMUNITY): Payer: Self-pay

## 2021-04-02 DIAGNOSIS — M79661 Pain in right lower leg: Secondary | ICD-10-CM | POA: Insufficient documentation

## 2021-04-02 DIAGNOSIS — D57219 Sickle-cell/Hb-C disease with crisis, unspecified: Secondary | ICD-10-CM | POA: Diagnosis not present

## 2021-04-02 DIAGNOSIS — R070 Pain in throat: Secondary | ICD-10-CM | POA: Diagnosis present

## 2021-04-02 DIAGNOSIS — R079 Chest pain, unspecified: Secondary | ICD-10-CM | POA: Insufficient documentation

## 2021-04-02 DIAGNOSIS — M79662 Pain in left lower leg: Secondary | ICD-10-CM | POA: Diagnosis not present

## 2021-04-02 DIAGNOSIS — U071 COVID-19: Secondary | ICD-10-CM | POA: Diagnosis not present

## 2021-04-02 DIAGNOSIS — D5700 Hb-SS disease with crisis, unspecified: Secondary | ICD-10-CM

## 2021-04-02 DIAGNOSIS — Z79899 Other long term (current) drug therapy: Secondary | ICD-10-CM | POA: Insufficient documentation

## 2021-04-02 LAB — CBC WITH DIFFERENTIAL/PLATELET
Abs Immature Granulocytes: 0.02 10*3/uL (ref 0.00–0.07)
Basophils Absolute: 0 10*3/uL (ref 0.0–0.1)
Basophils Relative: 0 %
Eosinophils Absolute: 0.2 10*3/uL (ref 0.0–0.5)
Eosinophils Relative: 2 %
HCT: 36 % (ref 36.0–46.0)
Hemoglobin: 12.3 g/dL (ref 12.0–15.0)
Immature Granulocytes: 0 %
Lymphocytes Relative: 30 %
Lymphs Abs: 3 10*3/uL (ref 0.7–4.0)
MCH: 23.1 pg — ABNORMAL LOW (ref 26.0–34.0)
MCHC: 34.2 g/dL (ref 30.0–36.0)
MCV: 67.5 fL — ABNORMAL LOW (ref 80.0–100.0)
Monocytes Absolute: 0.7 10*3/uL (ref 0.1–1.0)
Monocytes Relative: 7 %
Neutro Abs: 6 10*3/uL (ref 1.7–7.7)
Neutrophils Relative %: 61 %
Platelets: 295 10*3/uL (ref 150–400)
RBC: 5.33 MIL/uL — ABNORMAL HIGH (ref 3.87–5.11)
RDW: 17 % — ABNORMAL HIGH (ref 11.5–15.5)
WBC: 10 10*3/uL (ref 4.0–10.5)
nRBC: 0.5 % — ABNORMAL HIGH (ref 0.0–0.2)

## 2021-04-02 LAB — COMPREHENSIVE METABOLIC PANEL
ALT: 15 U/L (ref 0–44)
AST: 15 U/L (ref 15–41)
Albumin: 3.9 g/dL (ref 3.5–5.0)
Alkaline Phosphatase: 88 U/L (ref 38–126)
Anion gap: 7 (ref 5–15)
BUN: 12 mg/dL (ref 6–20)
CO2: 23 mmol/L (ref 22–32)
Calcium: 9.3 mg/dL (ref 8.9–10.3)
Chloride: 106 mmol/L (ref 98–111)
Creatinine, Ser: 0.65 mg/dL (ref 0.44–1.00)
GFR, Estimated: >60 mL/min (ref >60)
Glucose, Bld: 116 mg/dL — ABNORMAL HIGH (ref 70–99)
Potassium: 4.1 mmol/L (ref 3.5–5.1)
Sodium: 136 mmol/L (ref 135–145)
Total Bilirubin: 0.9 mg/dL (ref 0.3–1.2)
Total Protein: 7.9 g/dL (ref 6.5–8.1)

## 2021-04-02 LAB — I-STAT BETA HCG BLOOD, ED (MC, WL, AP ONLY): I-stat hCG, quantitative: <5 m[IU]/mL (ref <5)

## 2021-04-02 LAB — RESP PANEL BY RT-PCR (FLU A&B, COVID) ARPGX2
Influenza A by PCR: NEGATIVE
Influenza B by PCR: NEGATIVE
SARS Coronavirus 2 by RT PCR: POSITIVE — AB

## 2021-04-02 LAB — RETICULOCYTES
Immature Retic Fract: 24.2 % — ABNORMAL HIGH (ref 2.3–15.9)
RBC.: 5.24 MIL/uL — ABNORMAL HIGH (ref 3.87–5.11)
Retic Count, Absolute: 146 10*3/uL (ref 19.0–186.0)
Retic Ct Pct: 2.7 % (ref 0.4–3.1)

## 2021-04-02 LAB — D-DIMER, QUANTITATIVE: D-Dimer, Quant: 0.76 ug/mL-FEU — ABNORMAL HIGH (ref 0.00–0.50)

## 2021-04-02 MED ORDER — KETOROLAC TROMETHAMINE 30 MG/ML IJ SOLN
30.0000 mg | Freq: Once | INTRAMUSCULAR | Status: AC
Start: 2021-04-02 — End: 2021-04-02
  Administered 2021-04-02: 30 mg via INTRAVENOUS
  Filled 2021-04-02: qty 1

## 2021-04-02 MED ORDER — HYDROMORPHONE HCL 2 MG/ML IJ SOLN
2.0000 mg | Freq: Once | INTRAMUSCULAR | Status: AC
  Administered 2021-04-02: 2 mg via INTRAVENOUS
  Filled 2021-04-02: qty 1

## 2021-04-02 MED ORDER — HYDROMORPHONE HCL 1 MG/ML IJ SOLN
1.0000 mg | Freq: Once | INTRAMUSCULAR | Status: AC
  Administered 2021-04-02: 1 mg via INTRAVENOUS
  Filled 2021-04-02: qty 1

## 2021-04-02 MED ORDER — HYDROMORPHONE HCL 1 MG/ML IJ SOLN: 0.5000 mg | INTRAMUSCULAR | Status: DC

## 2021-04-02 MED ORDER — ALBUTEROL SULFATE HFA 108 (90 BASE) MCG/ACT IN AERS: 2.0000 | INHALATION_SPRAY | RESPIRATORY_TRACT | Status: DC | PRN

## 2021-04-02 MED ORDER — IOHEXOL 350 MG/ML SOLN
100.0000 mL | Freq: Once | INTRAVENOUS | Status: AC | PRN
  Administered 2021-04-02: 100 mL via INTRAVENOUS

## 2021-04-02 MED ORDER — SODIUM CHLORIDE 0.9 % IV BOLUS
500.0000 mL | Freq: Once | INTRAVENOUS | Status: AC
  Administered 2021-04-02: 500 mL via INTRAVENOUS

## 2021-04-02 MED ORDER — SODIUM CHLORIDE (PF) 0.9 % IJ SOLN
INTRAMUSCULAR | Status: AC
  Filled 2021-04-02: qty 50

## 2021-04-02 MED ORDER — HYDROMORPHONE HCL 1 MG/ML IJ SOLN: 0.5000 mg | INTRAMUSCULAR | Status: AC

## 2021-04-02 NOTE — ED Notes (Signed)
Pt reports minimal improvement in pain after multiple doses of pain medications, states that she normally feels better than this after getting 3 doses of dilaudid. Provider aware

## 2021-04-02 NOTE — ED Triage Notes (Addendum)
Patient c/o sickle cell pain in both legs and left arm x 2 days.  Patienat added that her children were diagnosed with Covid on 03/25/21 and she reports SOB since alst week.

## 2021-04-02 NOTE — ED Provider Notes (Signed)
[facility] DEPT Provider Note   CSN: [id number] Arrival date & time: 04/02/21  0805     History  Chief Complaint  Patient presents with   Sickle Cell Pain Crisis    Stephanie Burch is a 41 y.o. female.  Patient complains of some shortness of breath.  She stated she has COVID symptoms for last 10-days.  Her children were diagnosed with COVID 10 days ago.  She also complains with sick cell crisis, pain in both of her lower extremities  The history is provided by the patient and medical records. No language interpreter was used.  Sickle Cell Pain Crisis Pain location: Both lower extremity. Severity:  Moderate Onset quality:  Sudden Similar to previous crisis episodes: no   Timing:  Constant Progression:  Worsening Chronicity:  New History of pulmonary emboli: no   Context: not alcohol consumption   Associated symptoms: no chest pain, no congestion, no cough, no fatigue and no headaches       Home Medications Prior to Admission medications   Medication Sig Start Date End Date Taking? Authorizing Provider  folic acid (FOLVITE) 1 MG tablet Take 1 tablet (1 mg total) by mouth daily. Patient not taking: Reported on 02/12/2021 11/15/18   Azzie Glatter, FNP  gabapentin (NEURONTIN) 300 MG capsule Take 300 mg by mouth 3 (three) times daily. Patient not taking: Reported on 02/12/2021    [provider]  ibuprofen (ADVIL) 200 MG tablet Take 800-1,000 mg by mouth every 6 (six) hours as needed for fever, headache or mild pain.    [provider]  ondansetron (ZOFRAN) 4 MG tablet TAKE 1 TABLET BY MOUTH EVERY 8 HOURS AS NEEDED FOR NAUSEA AND VOMITING Patient not taking: Reported on 02/12/2021 12/24/20   Dorena Dew, FNP  oxyCODONE ER [facility]) 18 MG C12A Take 18 mg by mouth every 12 (twelve) hours. 03/25/21 04/24/21  Dorena Dew, FNP  Oxycodone HCl 20 MG TABS Take 1 tablet (20 mg total) by mouth every 4 (four) hours as needed for  up to 15 days (pain). 04/02/21 04/17/21  Dorena Dew, FNP  tiZANidine (ZANAFLEX) 4 MG capsule Take 4 mg by mouth 3 (three) times daily. Patient not taking: Reported on 02/12/2021 10/07/20   [provider]      Allergies    Patient has no known allergies.    Review of Systems   Review of Systems  Constitutional:  Negative for appetite change and fatigue.  HENT:  Negative for congestion, ear discharge and sinus pressure.   Eyes:  Negative for discharge.  Respiratory:  Negative for cough.   Cardiovascular:  Negative for chest pain.  Gastrointestinal:  Negative for abdominal pain and diarrhea.  Genitourinary:  Negative for frequency and hematuria.  Musculoskeletal:  Negative for back pain.       Pain in both lower extremities  Skin:  Negative for rash.  Neurological:  Negative for seizures and headaches.  Psychiatric/Behavioral:  Negative for hallucinations.    Physical Exam Updated Vital Signs BP (!) 142/105    Pulse 99    Temp 98.3 F (36.8 C) (Oral)    Resp 13    Ht 5\' 5"  (1.651 m)    Wt 136.1 kg    SpO2 99%    BMI 49.92 kg/m  Physical Exam Vitals and nursing note reviewed.  Constitutional:      Appearance: She is well-developed. She is ill-appearing.  HENT:     Head: Normocephalic.  Nose: Nose normal.  Eyes:     General: No scleral icterus.    Conjunctiva/sclera: Conjunctivae normal.  Neck:     Thyroid: No thyromegaly.  Cardiovascular:     Rate and Rhythm: Normal rate and regular rhythm.     Heart sounds: No murmur heard.   No friction rub. No gallop.  Pulmonary:     Breath sounds: No stridor. No wheezing or rales.  Chest:     Chest wall: No tenderness.  Abdominal:     General: There is no distension.     Tenderness: There is no abdominal tenderness. There is no rebound.  Musculoskeletal:        General: Normal range of motion.     Cervical back: Neck supple.  Lymphadenopathy:     Cervical: No cervical adenopathy.  Skin:    Findings: No erythema  or rash.  Neurological:     Mental Status: She is alert and oriented to person, place, and time.     Motor: No abnormal muscle tone.     Coordination: Coordination normal.  Psychiatric:        Behavior: Behavior normal.    ED Results / Procedures / Treatments   Labs (all labs ordered are listed, but only abnormal results are displayed) Labs Reviewed  RESP PANEL BY RT-PCR (FLU A&B, COVID) ARPGX2 - Abnormal; Notable for the following components:      Result Value   SARS Coronavirus 2 by RT PCR POSITIVE (*)    All other components within normal limits  COMPREHENSIVE METABOLIC PANEL - Abnormal; Notable for the following components:   Glucose, Bld 116 (*)    All other components within normal limits  CBC WITH DIFFERENTIAL/PLATELET - Abnormal; Notable for the following components:   RBC 5.33 (*)    MCV 67.5 (*)    MCH 23.1 (*)    RDW 17.0 (*)    nRBC 0.5 (*)    All other components within normal limits  RETICULOCYTES - Abnormal; Notable for the following components:   RBC. 5.24 (*)    Immature Retic Fract 24.2 (*)    All other components within normal limits  D-DIMER, QUANTITATIVE - Abnormal; Notable for the following components:   D-Dimer, Quant 0.76 (*)    All other components within normal limits  I-STAT BETA HCG BLOOD, ED ([facility], [facility], [facility] ONLY)    EKG EKG Interpretation  Date/Time:  Tuesday April 02 2021 08:14:57 EST Ventricular Rate:  120 PR Interval:  151 QRS Duration: 75 QT Interval:  337 QTC Calculation: 477 R Axis:   76 Text Interpretation: Sinus tachycardia Borderline repolarization abnormality Baseline wander in lead(s) V3 Confirmed by Milton Ferguson [phone]) on 04/02/2021 2:16:07 PM  Radiology DG Chest 2 View  Result Date: 04/02/2021 CLINICAL DATA:  Shortness of breath and weakness. History of sickle cell anemia. EXAM: CHEST - 2 VIEW COMPARISON:  Radiographs 10/19/2020 and 07/15/2020. FINDINGS: The heart size and mediastinal contours are stable. Stable minimal  atelectasis or scarring at both lung bases. The lungs are otherwise clear. There is no pleural effusion or pneumothorax. The bones appear unremarkable. Telemetry leads overlie the chest. IMPRESSION: No active cardiopulmonary process. Electronically Signed   By: Richardean Sale M.D.   On: 04/02/2021 08:39   CT Angio Chest PE W and/or Wo Contrast  Result Date: 04/02/2021 CLINICAL DATA:  Chest pain or shortness of breath. Pleurisy or effusion suspected. Sickle cell pain in both legs and left arm for 2 days. Children diagnosed with COVID on  03/25/2021. Shortness of breath since last week. EXAM: CT ANGIOGRAPHY CHEST WITH CONTRAST TECHNIQUE: Multidetector CT imaging of the chest was performed using the standard protocol during bolus administration of intravenous contrast. Multiplanar CT image reconstructions and MIPs were obtained to evaluate the vascular anatomy. RADIATION DOSE REDUCTION: This exam was performed according to the departmental dose-optimization program which includes automated exposure control, adjustment of the mA and/or kV according to patient size and/or use of iterative reconstruction technique. CONTRAST:  128mL OMNIPAQUE IOHEXOL 350 MG/ML SOLN COMPARISON:  Chest two views 04/02/2021, AP chest 10/19/2020; CTA chest 11/28/2019 and 04/19/2019 FINDINGS: Cardiovascular: The main pulmonary artery is opacified up to 153 Hounsfield units. There is mild respiratory and cardiac motion artifact. No large main left main right main, or lobar filling defect is seen to indicate a pulmonary embolism. The more distal pulmonary arteries are not well evaluated due to the technical limitations. The main pulmonary artery measures up to 3.6 cm in caliber similar to prior and again enlarged as can be seen with chronic pulmonary arterial hypertension. No bulging of the intra-atrial septum to indicate CT evidence of right heart strain. Heart size is again mildly enlarged. No pericardial effusion. No thoracic aortic  aneurysm. Mediastinum/Nodes: No axillary, mediastinal or hilar pathologically enlarged lymph nodes by CT criteria. The visualized thyroid is unremarkable. The esophagus follows a normal course and normal caliber. Lungs/Pleura: The central airways are patent. Respiratory motion artifact markedly limits evaluation of the lung parenchyma. Resolution of the prior moderate bilateral pleural effusions. Mild bibasilar scattered ground-glass opacification and linear right lower lobe and lingular subsegmental atelectasis versus scarring. Lateral right lower lobe 6 mm nodule (series 7, image 61) is unchanged from 04/19/2019 and benign. Left lower lobe 4 mm nodule (series 7, image 58) is unchanged from 04/19/2019 and benign. Upper Abdomen: The spleen is again partially visualized and heterogeneous, similar to prior. Normal adrenals. Musculoskeletal: Mild multilevel degenerative disc changes of the thoracic spine. The bilateral humeral heads are again partially visualized. There is again patchy sclerosis and lucency within the bilateral humeral heads compatible with avascular necrosis. Review of the MIP images confirms the above findings. IMPRESSION:: IMPRESSION: 1. No large central pulmonary embolism is seen through the lobar pulmonary arteries. Again evaluation of the more distal pulmonary arteries is limited due to technical factors. 2. Unchanged mild cardiomegaly. 3. The bilateral pleural effusion seen on prior 11/28/2019 CT have resolved. No significant lung consolidation to indicate pneumonia. 4. 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 Electronically Signed   By: Yvonne Kendall M.D.   On: 04/02/2021 12:47    Procedures Procedures    Medications Ordered in ED Medications  HYDROmorphone (DILAUDID) injection 0.5 mg (0.5 mg Subcutaneous Not Given 04/02/21 0815)  albuterol (VENTOLIN HFA) 108 (90 Base) MCG/ACT inhaler 2 puff (has no administration in time range)  sodium chloride (PF)  0.9 % injection (has no administration in time range)  sodium chloride 0.9 % bolus 500 mL (0 mLs Intravenous Stopped 04/02/21 1225)  HYDROmorphone (DILAUDID) injection 1 mg (1 mg Intravenous Given 04/02/21 0901)  ketorolac (TORADOL) 30 MG/ML injection 30 mg (30 mg Intravenous Given 04/02/21 0901)  HYDROmorphone (DILAUDID) injection 1 mg (1 mg Intravenous Given 04/02/21 0934)  HYDROmorphone (DILAUDID) injection 2 mg (2 mg Intravenous Given 04/02/21 1024)  iohexol (OMNIPAQUE) 350 MG/ML injection 100 mL (100 mLs Intravenous Contrast Given 04/02/21 1030)  HYDROmorphone (DILAUDID) injection 2 mg (2 mg Intravenous Given 04/02/21 1213)  HYDROmorphone (DILAUDID) injection 2 mg (2 mg Intravenous  Given 04/02/21 1333)    ED Course/ Medical Decision Making/ A&P  CRITICAL CARE Performed by: Milton Ferguson Total critical care time: 40 minutes Critical care time was exclusive of separately billable procedures and treating other patients. Critical care was necessary to treat or prevent imminent or life-threatening deterioration. Critical care was time spent personally by me on the following activities: development of treatment plan with patient and/or surrogate as well as nursing, discussions with consultants, evaluation of patient's response to treatment, examination of patient, obtaining history from patient or surrogate, ordering and performing treatments and interventions, ordering and review of laboratory studies, ordering and review of radiographic studies, pulse oximetry and re-evaluation of patient's condition.                          Medical Decision Making Amount and/or Complexity of Data Reviewed Labs: ordered. Radiology: ordered.  Risk Prescription drug management.   This patient presents to the ED for concern of sickle cell crisis, and chest pain this involves an extensive number of treatment options, and is a complaint that carries with it a high risk of complications and morbidity.  The  differential diagnosis includes sickle cell crisis, arterial occlusion   Co morbidities that complicate the patient evaluation  Sickle cell disease   Additional history obtained:  Additional history obtained from patient External records from outside source obtained and reviewed including hospital record   Lab Tests:  I Ordered, and personally interpreted labs.  The pertinent results include: CBC and chemistries unremarkable   Imaging Studies ordered:  I ordered imaging studies including CT chest I independently visualized and interpreted imaging which showed no PE I agree with the radiologist interpretation   Cardiac Monitoring:  The patient was maintained on a cardiac monitor.  I personally viewed and interpreted the cardiac monitored which showed an underlying rhythm of: Normal sinus rhythm   Medicines ordered and prescription drug management:  I ordered medication including Dilaudid for pain Reevaluation of the patient after these medicines showed that the patient improved I have reviewed the patients home medicines and have made adjustments as needed   Test Considered:  None   Critical Interventions:  IV pain med   Consultations Obtained:  No consult  Problem List / ED Course:  Sickle cell crisis    Social Determinants of Health:  None      Patient with sickle cell crisis and COVID for 10 days.  She will follow-up with her sickle cell doctor        Final Clinical Impression(s) / ED Diagnoses Final diagnoses:  Sickle cell pain crisis ([place])  COVID-19    Rx / DC Orders ED Discharge Orders     None         Milton Ferguson, MD 04/04/21 1021

## 2021-04-02 NOTE — ED Notes (Signed)
I Stat Beta not ran due to insufficient amount of blood sample.

## 2021-04-02 NOTE — Discharge Instructions (Signed)
Continue taking her pain medicines.  Drink plenty of fluids.  Follow-up if not improving

## 2021-04-11 ENCOUNTER — Other Ambulatory Visit: Payer: Self-pay

## 2021-04-11 ENCOUNTER — Encounter (HOSPITAL_COMMUNITY): Payer: Self-pay | Admitting: Family Medicine

## 2021-04-11 ENCOUNTER — Observation Stay (HOSPITAL_COMMUNITY)
Admission: EM | Admit: 2021-04-11 | Discharge: 2021-04-12 | Disposition: A | Discharge status: Home / Self Care | Payer: Medicare Other | Attending: Internal Medicine | Admitting: Internal Medicine

## 2021-04-11 DIAGNOSIS — Z8616 Personal history of COVID-19: Secondary | ICD-10-CM | POA: Diagnosis not present

## 2021-04-11 DIAGNOSIS — Z20822 Contact with and (suspected) exposure to covid-19: Secondary | ICD-10-CM | POA: Insufficient documentation

## 2021-04-11 DIAGNOSIS — D5700 Hb-SS disease with crisis, unspecified: Secondary | ICD-10-CM | POA: Diagnosis present

## 2021-04-11 DIAGNOSIS — Z79899 Other long term (current) drug therapy: Secondary | ICD-10-CM | POA: Diagnosis not present

## 2021-04-11 DIAGNOSIS — G894 Chronic pain syndrome: Secondary | ICD-10-CM | POA: Diagnosis not present

## 2021-04-11 DIAGNOSIS — Z6841 Body Mass Index (BMI) 40.0 and over, adult: Secondary | ICD-10-CM

## 2021-04-11 LAB — URINALYSIS, ROUTINE W REFLEX MICROSCOPIC
Bilirubin Urine: NEGATIVE
Glucose, UA: NEGATIVE mg/dL
Hgb urine dipstick: NEGATIVE
Ketones, ur: NEGATIVE mg/dL
Leukocytes,Ua: NEGATIVE
Nitrite: NEGATIVE
Protein, ur: NEGATIVE mg/dL
Specific Gravity, Urine: 1.01 (ref 1.005–1.030)
pH: 7 (ref 5.0–8.0)

## 2021-04-11 LAB — COMPREHENSIVE METABOLIC PANEL
ALT: 11 U/L (ref 0–44)
AST: 11 U/L — ABNORMAL LOW (ref 15–41)
Albumin: 3.8 g/dL (ref 3.5–5.0)
Alkaline Phosphatase: 81 U/L (ref 38–126)
Anion gap: 8 (ref 5–15)
BUN: 9 mg/dL (ref 6–20)
CO2: 26 mmol/L (ref 22–32)
Calcium: 9 mg/dL (ref 8.9–10.3)
Chloride: 104 mmol/L (ref 98–111)
Creatinine, Ser: 0.64 mg/dL (ref 0.44–1.00)
GFR, Estimated: >60 mL/min (ref >60)
Glucose, Bld: 121 mg/dL — ABNORMAL HIGH (ref 70–99)
Potassium: 3.9 mmol/L (ref 3.5–5.1)
Sodium: 138 mmol/L (ref 135–145)
Total Bilirubin: 0.7 mg/dL (ref 0.3–1.2)
Total Protein: 7.7 g/dL (ref 6.5–8.1)

## 2021-04-11 LAB — CBC WITH DIFFERENTIAL/PLATELET
Abs Immature Granulocytes: 0.03 10*3/uL (ref 0.00–0.07)
Basophils Absolute: 0 10*3/uL (ref 0.0–0.1)
Basophils Relative: 1 %
Eosinophils Absolute: 0.1 10*3/uL (ref 0.0–0.5)
Eosinophils Relative: 1 %
HCT: 34.4 % — ABNORMAL LOW (ref 36.0–46.0)
Hemoglobin: 11.7 g/dL — ABNORMAL LOW (ref 12.0–15.0)
Immature Granulocytes: 0 %
Lymphocytes Relative: 22 %
Lymphs Abs: 2 10*3/uL (ref 0.7–4.0)
MCH: 23.2 pg — ABNORMAL LOW (ref 26.0–34.0)
MCHC: 34 g/dL (ref 30.0–36.0)
MCV: 68.3 fL — ABNORMAL LOW (ref 80.0–100.0)
Monocytes Absolute: 0.6 10*3/uL (ref 0.1–1.0)
Monocytes Relative: 7 %
Neutro Abs: 6.1 10*3/uL (ref 1.7–7.7)
Neutrophils Relative %: 69 %
Platelets: 349 10*3/uL (ref 150–400)
RBC: 5.04 MIL/uL (ref 3.87–5.11)
RDW: 17.2 % — ABNORMAL HIGH (ref 11.5–15.5)
WBC: 8.9 10*3/uL (ref 4.0–10.5)
nRBC: 0.2 % (ref 0.0–0.2)

## 2021-04-11 LAB — RESP PANEL BY RT-PCR (FLU A&B, COVID) ARPGX2
Influenza A by PCR: NEGATIVE
Influenza B by PCR: NEGATIVE
SARS Coronavirus 2 by RT PCR: NEGATIVE

## 2021-04-11 LAB — RETICULOCYTES
Immature Retic Fract: 37.6 % — ABNORMAL HIGH (ref 2.3–15.9)
RBC.: 4.99 MIL/uL (ref 3.87–5.11)
Retic Count, Absolute: 252 10*3/uL — ABNORMAL HIGH (ref 19.0–186.0)
Retic Ct Pct: 5.1 % — ABNORMAL HIGH (ref 0.4–3.1)

## 2021-04-11 LAB — I-STAT BETA HCG BLOOD, ED (MC, WL, AP ONLY): I-stat hCG, quantitative: <5 m[IU]/mL (ref <5)

## 2021-04-11 MED ORDER — GABAPENTIN 100 MG PO CAPS
100.0000 mg | ORAL_CAPSULE | Freq: Three times a day (TID) | ORAL | Status: DC
  Administered 2021-04-11 – 2021-04-12 (×2): 100 mg via ORAL
  Filled 2021-04-11 (×4): qty 1

## 2021-04-11 MED ORDER — HYDROMORPHONE HCL 2 MG/ML IJ SOLN
2.0000 mg | INTRAMUSCULAR | Status: AC
  Administered 2021-04-11: 08:00:00 2 mg via INTRAVENOUS
  Filled 2021-04-11: qty 1

## 2021-04-11 MED ORDER — KETOROLAC TROMETHAMINE 15 MG/ML IJ SOLN
15.0000 mg | Freq: Four times a day (QID) | INTRAMUSCULAR | Status: DC
  Administered 2021-04-11 – 2021-04-12 (×5): 15 mg via INTRAVENOUS
  Filled 2021-04-11 (×5): qty 1

## 2021-04-11 MED ORDER — ENOXAPARIN SODIUM 40 MG/0.4ML IJ SOSY
40.0000 mg | PREFILLED_SYRINGE | INTRAMUSCULAR | Status: DC
  Administered 2021-04-11: 18:00:00 40 mg via SUBCUTANEOUS
  Filled 2021-04-11: qty 0.4

## 2021-04-11 MED ORDER — SODIUM CHLORIDE 0.45 % IV SOLN: INTRAVENOUS | Status: DC

## 2021-04-11 MED ORDER — ONDANSETRON HCL 4 MG/2ML IJ SOLN
4.0000 mg | INTRAMUSCULAR | Status: DC | PRN
  Administered 2021-04-11 – 2021-04-12 (×4): 4 mg via INTRAVENOUS
  Filled 2021-04-11 (×4): qty 2

## 2021-04-11 MED ORDER — SENNOSIDES-DOCUSATE SODIUM 8.6-50 MG PO TABS
1.0000 | ORAL_TABLET | Freq: Two times a day (BID) | ORAL | Status: DC
  Filled 2021-04-11 (×2): qty 1

## 2021-04-11 MED ORDER — HYDROMORPHONE HCL 2 MG/ML IJ SOLN
2.0000 mg | INTRAMUSCULAR | Status: AC
  Administered 2021-04-11: 09:00:00 2 mg via INTRAVENOUS
  Filled 2021-04-11: qty 1

## 2021-04-11 MED ORDER — KETOROLAC TROMETHAMINE 15 MG/ML IJ SOLN
15.0000 mg | Freq: Four times a day (QID) | INTRAMUSCULAR | Status: DC
Start: 2021-04-11 — End: 2021-04-11

## 2021-04-11 MED ORDER — SODIUM CHLORIDE 0.9 % IV SOLN
12.5000 mg | Freq: Once | INTRAVENOUS | Status: AC
  Administered 2021-04-11: 10:00:00 12.5 mg via INTRAVENOUS
  Filled 2021-04-11: qty 12.5

## 2021-04-11 MED ORDER — HYDROMORPHONE 1 MG/ML IV SOLN
INTRAVENOUS | Status: DC
  Administered 2021-04-11: 30 mg via INTRAVENOUS
  Administered 2021-04-11: 10.5 mg via INTRAVENOUS
  Administered 2021-04-11: 7.5 mg via INTRAVENOUS
  Administered 2021-04-11: 2.5 mg via INTRAVENOUS
  Administered 2021-04-12: 8 mg via INTRAVENOUS
  Administered 2021-04-12: 2.5 mg via INTRAVENOUS
  Administered 2021-04-12: 8 mg via INTRAVENOUS
  Filled 2021-04-11 (×2): qty 30

## 2021-04-11 MED ORDER — DIPHENHYDRAMINE HCL 25 MG PO CAPS
25.0000 mg | ORAL_CAPSULE | ORAL | Status: DC | PRN
Start: 2021-04-11 — End: 2021-04-12

## 2021-04-11 MED ORDER — OXYCODONE HCL ER 20 MG PO T12A
20.0000 mg | EXTENDED_RELEASE_TABLET | Freq: Two times a day (BID) | ORAL | Status: DC
  Administered 2021-04-11 – 2021-04-12 (×3): 20 mg via ORAL
  Filled 2021-04-11 (×2): qty 1
  Filled 2021-04-11: qty 2

## 2021-04-11 MED ORDER — SODIUM CHLORIDE 0.9% FLUSH: 9.0000 mL | INTRAVENOUS | Status: DC | PRN

## 2021-04-11 MED ORDER — FOLIC ACID 1 MG PO TABS
1.0000 mg | ORAL_TABLET | Freq: Every day | ORAL | Status: DC
  Administered 2021-04-11 – 2021-04-12 (×2): 1 mg via ORAL
  Filled 2021-04-11 (×2): qty 1

## 2021-04-11 MED ORDER — HYDROMORPHONE HCL 1 MG/ML IJ SOLN
1.0000 mg | INTRAMUSCULAR | Status: DC | PRN
  Filled 2021-04-11: qty 1

## 2021-04-11 MED ORDER — OXYCODONE HCL 5 MG PO TABS
20.0000 mg | ORAL_TABLET | ORAL | Status: DC | PRN
  Administered 2021-04-12: 20 mg via ORAL
  Filled 2021-04-11: qty 4

## 2021-04-11 MED ORDER — POLYETHYLENE GLYCOL 3350 17 G PO PACK
17.0000 g | PACK | Freq: Every day | ORAL | Status: DC | PRN
Start: 2021-04-11 — End: 2021-04-12

## 2021-04-11 MED ORDER — ONDANSETRON HCL 4 MG/2ML IJ SOLN: 4.0000 mg | Freq: Four times a day (QID) | INTRAMUSCULAR | Status: DC | PRN

## 2021-04-11 MED ORDER — KETOROLAC TROMETHAMINE 15 MG/ML IJ SOLN
15.0000 mg | INTRAMUSCULAR | Status: AC
  Administered 2021-04-11: 08:00:00 15 mg via INTRAVENOUS
  Filled 2021-04-11: qty 1

## 2021-04-11 MED ORDER — NALOXONE HCL 0.4 MG/ML IJ SOLN: 0.4000 mg | INTRAMUSCULAR | Status: DC | PRN

## 2021-04-11 MED ORDER — HYDROMORPHONE HCL 2 MG/ML IJ SOLN
2.0000 mg | INTRAMUSCULAR | Status: AC
  Administered 2021-04-11: 10:00:00 2 mg via INTRAVENOUS
  Filled 2021-04-11: qty 1

## 2021-04-11 NOTE — ED Triage Notes (Signed)
Reports sickle cell pain crisis in her legs and L arm since yesterday, home meds w/o relief. Denies N/V/D, fevers or chills.  ?

## 2021-04-11 NOTE — H&P (Signed)
H&P  Patient Demographics:  Stephanie Burch, is a 41 y.o. female  MRN: [id number]   DOB - Jun 01, 1980  Admit Date - 04/11/2021  Outpatient Primary MD for the patient is Massie Maroon, FNP  Chief Complaint  Patient presents with   Sickle Cell Pain Crisis      HPI:   Stephanie Burch  is a 41 y.o. female with a medical history significant for sickle cell disease, chronic pain syndrome, opiate dependence and tolerance, history of anemia of chronic disease, and morbid obesity presents with complaints of left arm and lower extremity pain.  Patient states that pain intensity has been elevated over the past 3 days and unrelieved by her home medications.  She says that this pain is typical of her sickle cell pain crisis.  She characterizes her pain as constant and sharp.  She last had Xtampza and oxycodone this a.m. without sustained relief.  Patient was previously on Adakveo infusion, however she has been lost to follow-up over the past several months.  She has not been taking folic acid lately.  She currently rates pain as 8/10.  She denies any fever, chills, chest pain, or shortness of breath.  No urinary symptoms, nausea, vomiting, or diarrhea.  No sick contacts, recent travel, or known exposure to COVID-19.  ER course: Vital signs are recorded as:BP 128/63    Pulse 96    Temp 98.5 F (36.9 C) (Oral)    Resp 13    Ht 5\' 5"  (1.651 m)    Wt 136 kg    SpO2 94%    BMI 49.89 kg/m  Comprehensive metabolic panel unremarkable.  Complete blood count shows WBCs 10.0, hemoglobin 12.3 g/dL, and platelets .  Beta-hCG negative.  COVID-19, and influenza pending.  Patient's pain persists despite IV Dilaudid, IV Toradol, and IV fluids.  Patient will be admitted to MedSurg and observation for further management of sickle cell pain crisis.    Review of systems:  In addition to the HPI above, patient reports No fever or chills No Headache, No changes with vision or hearing No problems swallowing food or  liquids No chest pain, cough or shortness of breath No abdominal pain, No nausea or vomiting, Bowel movements are regular No blood in stool or urine No dysuria No new skin rashes or bruises No new joints pains-aches No new weakness, tingling, numbness in any extremity No recent weight gain or loss No polyuria, polydypsia or polyphagia No significant Mental Stressors  With Past History of the following :   Past Medical History:  Diagnosis Date   HSV infection    Nausea    Sickle cell anemia (HCC)    Vitamin B12 deficiency 12/2018   Vitamin D deficiency       No past surgical history on file.   Social History:   Social History   Tobacco Use   Smoking status: Never   Smokeless tobacco: Never  Substance Use Topics   Alcohol use: Not Currently     Lives - At home   Family History :   Family History  Problem Relation Age of Onset   Hypertension Mother    Sickle cell trait Mother    Diabetes Father    Sickle cell trait Father      Home Medications:   Prior to Admission medications   Medication Sig Start Date End Date Taking? Authorizing Provider  folic acid (FOLVITE) 1 MG tablet Take 1 tablet (1 mg total) by mouth daily. Patient not taking:  Reported on 02/12/2021 11/15/18   Kallie LocksStroud, [name], FNP  gabapentin (NEURONTIN) 300 MG capsule Take 300 mg by mouth 3 (three) times daily. Patient not taking: Reported on 02/12/2021    [provider]  ibuprofen (ADVIL) 200 MG tablet Take 800-1,000 mg by mouth every 6 (six) hours as needed for fever, headache or mild pain.    [provider]  ondansetron (ZOFRAN) 4 MG tablet TAKE 1 TABLET BY MOUTH EVERY 8 HOURS AS NEEDED FOR NAUSEA AND VOMITING Patient not taking: Reported on 02/12/2021 12/24/20   Massie MaroonHollis, Korryn Pancoast M, FNP  oxyCODONE ER [facility](XTAMPZA ER) 18 MG C12A Take 18 mg by mouth every 12 (twelve) hours. 03/25/21 04/24/21  Massie MaroonHollis, Vannah Nadal M, FNP  Oxycodone HCl 20 MG TABS Take 1 tablet (20 mg total) by mouth every 4  (four) hours as needed for up to 15 days (pain). 04/02/21 04/17/21  Massie MaroonHollis, Tranae Laramie M, FNP  tiZANidine (ZANAFLEX) 4 MG capsule Take 4 mg by mouth 3 (three) times daily. Patient not taking: Reported on 02/12/2021 10/07/20   [provider]     Allergies:   No Known Allergies   Physical Exam:   Vitals:   Vitals:   04/11/21 0915 04/11/21 0930  BP:  128/63  Pulse: (!) 103 96  Resp: 17 13  Temp:    SpO2: 97% 94%    Physical Exam: Constitutional: Patient appears well-developed and well-nourished.  Obesity.  Not in obvious distress. HENT: Normocephalic, atraumatic, External right and left ear normal. Oropharynx is clear and moist.  Eyes: Conjunctivae and EOM are normal. PERRLA, no scleral icterus. Neck: Normal ROM. Neck supple. No JVD. No tracheal deviation. No thyromegaly. CVS: RRR, S1/S2 +, no murmurs, no gallops, no carotid bruit.  Pulmonary: Effort and breath sounds normal, no stridor, rhonchi, wheezes, rales.  Abdominal: Soft. BS +, no distension, tenderness, rebound or guarding.  Musculoskeletal: Normal range of motion. No edema and no tenderness.  Lymphadenopathy: No lymphadenopathy noted, cervical, inguinal or axillary Neuro: Alert. Normal reflexes, muscle tone coordination. No cranial nerve deficit. Skin: Skin is warm and dry. No rash noted. Not diaphoretic. No erythema. No pallor. Psychiatric: Normal mood and affect. Behavior, judgment, thought content normal.   Data Review:   CBC Recent Labs  Lab 04/11/21 0808  WBC 8.9  HGB 11.7*  HCT 34.4*  PLT 349  MCV 68.3*  MCH 23.2*  MCHC 34.0  RDW 17.2*  LYMPHSABS 2.0  MONOABS 0.6  EOSABS 0.1  BASOSABS 0.0   ------------------------------------------------------------------------------------------------------------------  Chemistries  Recent Labs  Lab 04/11/21 0808  NA 138  K 3.9  CL 104  CO2 26  GLUCOSE 121*  BUN 9  CREATININE 0.64  CALCIUM 9.0  AST 11*  ALT 11  ALKPHOS 81  BILITOT 0.7    ------------------------------------------------------------------------------------------------------------------ estimated creatinine clearance is 133.4 mL/min (by C-G formula based on SCr of 0.64 mg/dL). ------------------------------------------------------------------------------------------------------------------ No results for input(s): TSH, T4TOTAL, T3FREE, THYROIDAB in the last 72 hours.  Invalid input(s): FREET3  Coagulation profile No results for input(s): INR, PROTIME in the last 168 hours. ------------------------------------------------------------------------------------------------------------------- No results for input(s): DDIMER in the last 72 hours. -------------------------------------------------------------------------------------------------------------------  Cardiac Enzymes No results for input(s): CKMB, TROPONINI, MYOGLOBIN in the last 168 hours.  Invalid input(s): CK ------------------------------------------------------------------------------------------------------------------ No results found for: BNP  ---------------------------------------------------------------------------------------------------------------  Urinalysis    Component Value Date/Time   COLORURINE YELLOW 04/11/2021 0913   APPEARANCEUR CLEAR 04/11/2021 0913   LABSPEC 1.010 04/11/2021 0913   PHURINE 7.0 04/11/2021 0913   GLUCOSEU NEGATIVE 04/11/2021  0913   HGBUR NEGATIVE 04/11/2021 0913   BILIRUBINUR NEGATIVE 04/11/2021 0913   BILIRUBINUR Negative 04/27/2019 0935   KETONESUR NEGATIVE 04/11/2021 0913   PROTEINUR NEGATIVE 04/11/2021 0913   UROBILINOGEN 1.0 04/27/2019 0935   NITRITE NEGATIVE 04/11/2021 0913   LEUKOCYTESUR NEGATIVE 04/11/2021 0913    ----------------------------------------------------------------------------------------------------------------   Imaging Results:    No results found.   Assessment & Plan:  Principal Problem:   Sickle cell pain crisis  (HCC) Active Problems:   Morbid obesity with body mass index (BMI) of 45.0 to 49.9 in adult [facility])   Chronic pain syndrome  Sickle cell disease with pain crisis: Admit to MedSurg.  IV fluids, 0.45% saline at 100 mL/h Initiate IV Dilaudid PCA with settings of 0.5 mg, 10-minute lockout, and 3 mg/h Toradol 15 mg IV every 6 hours Restart home medications, OxyContin 20 mg every 12 hours Monitor vital signs closely, reevaluate pain scale regularly, and supplemental oxygen as needed. Patient will be reevaluated for pain in the context of function and relationship to baseline as care progresses.  Chronic pain syndrome: Continue home medications.  Hold oxycodone, use PCA Dilaudid.  Morbid obesity: Patient counseled at length on the importance of following a low-fat, low carbohydrate diet divided over small meals.  Patient has been unable to exercise consistently due to chronic pain.  Will maintain a heart healthy diet.  DVT Prophylaxis: Subcut Lovenox   AM Labs Ordered, also please review Full Orders  Family Communication: Admission, patient's condition and plan of care including tests being ordered have been discussed with the patient who indicate understanding and agree with the plan and Code Status.  Code Status: Full Code  Consults called: None    Admission status: Inpatient    Time spent in minutes : 30 minutes  Nolon Nations  APRN, MSN, FNP-C Patient Care [facility] [address] [place], [place] [address] [phone]  04/11/2021 at 11:29 AM

## 2021-04-11 NOTE — ED Notes (Signed)
Pt aware of urine specimen needed.  ?

## 2021-04-11 NOTE — ED Provider Notes (Signed)
[facility]-EMERGENCY DEPT Provider Note   CSN: [id number] Arrival date & time: 04/11/21  0700     History  Chief Complaint  Patient presents with   Sickle Cell Pain Crisis    Stephanie Burch is a 41 y.o. female.  Pt is a 41 yo female with a hx of sickle cell disease, type [place], chronic pain syndrome, and obesity.  She said she has severe pain in her legs and in her left arm.  She has been taking her pain meds at home, but they are not helping.  Pain is her usual sickle cell pain.  She denies f/c.  No n/v.  No cp.  No sob.      Home Medications Prior to Admission medications   Medication Sig Start Date End Date Taking? Authorizing Provider  folic acid (FOLVITE) 1 MG tablet Take 1 tablet (1 mg total) by mouth daily. Patient not taking: Reported on 02/12/2021 11/15/18   Kallie Locks, FNP  gabapentin (NEURONTIN) 300 MG capsule Take 300 mg by mouth 3 (three) times daily. Patient not taking: Reported on 02/12/2021    [provider]  ibuprofen (ADVIL) 200 MG tablet Take 800-1,000 mg by mouth every 6 (six) hours as needed for fever, headache or mild pain.    [provider]  ondansetron (ZOFRAN) 4 MG tablet TAKE 1 TABLET BY MOUTH EVERY 8 HOURS AS NEEDED FOR NAUSEA AND VOMITING Patient not taking: Reported on 02/12/2021 12/24/20   Massie Maroon, FNP  oxyCODONE ER [facility] ER) 18 MG C12A Take 18 mg by mouth every 12 (twelve) hours. 03/25/21 04/24/21  Massie Maroon, FNP  Oxycodone HCl 20 MG TABS Take 1 tablet (20 mg total) by mouth every 4 (four) hours as needed for up to 15 days (pain). 04/02/21 04/17/21  Massie Maroon, FNP  tiZANidine (ZANAFLEX) 4 MG capsule Take 4 mg by mouth 3 (three) times daily. Patient not taking: Reported on 02/12/2021 10/07/20   [provider]      Allergies    Patient has no known allergies.    Review of Systems   Review of Systems  Musculoskeletal:        Bilateral leg and left arm pain  All other  systems reviewed and are negative.  Physical Exam Updated Vital Signs BP 128/63    Pulse 96    Temp 98.5 F (36.9 C) (Oral)    Resp 13    Ht 5\' 5"  (1.651 m)    Wt 136 kg    SpO2 94%    BMI 49.89 kg/m  Physical Exam Vitals and nursing note reviewed.  Constitutional:      Appearance: Normal appearance. She is obese.  HENT:     Head: Normocephalic and atraumatic.     Right Ear: External ear normal.     Left Ear: External ear normal.     Nose: Nose normal.     Mouth/Throat:     Mouth: Mucous membranes are moist.     Pharynx: Oropharynx is clear.  Eyes:     Extraocular Movements: Extraocular movements intact.     Conjunctiva/sclera: Conjunctivae normal.     Pupils: Pupils are equal, round, and reactive to light.  Cardiovascular:     Rate and Rhythm: Normal rate and regular rhythm.     Pulses: Normal pulses.     Heart sounds: Normal heart sounds.  Pulmonary:     Effort: Pulmonary effort is normal.     Breath sounds: Normal  breath sounds.  Abdominal:     General: Abdomen is flat. Bowel sounds are normal.     Palpations: Abdomen is soft.  Musculoskeletal:        General: Normal range of motion.     Cervical back: Normal range of motion and neck supple.  Skin:    General: Skin is warm.     Capillary Refill: Capillary refill takes less than 2 seconds.  Neurological:     General: No focal deficit present.     Mental Status: She is alert and oriented to person, place, and time.  Psychiatric:        Mood and Affect: Mood normal.        Behavior: Behavior normal.    ED Results / Procedures / Treatments   Labs (all labs ordered are listed, but only abnormal results are displayed) Labs Reviewed  COMPREHENSIVE METABOLIC PANEL - Abnormal; Notable for the following components:      Result Value   Glucose, Bld 121 (*)    AST 11 (*)    All other components within normal limits  CBC WITH DIFFERENTIAL/PLATELET - Abnormal; Notable for the following components:   Hemoglobin 11.7 (*)     HCT 34.4 (*)    MCV 68.3 (*)    MCH 23.2 (*)    RDW 17.2 (*)    All other components within normal limits  RETICULOCYTES - Abnormal; Notable for the following components:   Retic Ct Pct 5.1 (*)    Retic Count, Absolute 252.0 (*)    Immature Retic Fract 37.6 (*)    All other components within normal limits  RESP PANEL BY RT-PCR (FLU A&B, COVID) ARPGX2  URINALYSIS, ROUTINE W REFLEX MICROSCOPIC  I-STAT BETA HCG BLOOD, ED ([facility], [facility], [facility] ONLY)    EKG None  Radiology No results found.  Procedures Procedures    Medications Ordered in ED Medications  ondansetron (ZOFRAN) injection 4 mg (4 mg Intravenous Given 04/11/21 0807)  Oxycodone HCl TABS 20 mg (has no administration in time range)  folic acid (FOLVITE) tablet 1 mg (has no administration in time range)  gabapentin (NEURONTIN) capsule 100 mg (has no administration in time range)  senna-docusate (Senokot-S) tablet 1 tablet (has no administration in time range)  polyethylene glycol (MIRALAX / GLYCOLAX) packet 17 g (has no administration in time range)  enoxaparin (LOVENOX) injection 40 mg (has no administration in time range)  ketorolac (TORADOL) 15 MG/ML injection 15 mg (has no administration in time range)  0.45 % sodium chloride infusion (has no administration in time range)  naloxone (NARCAN) injection 0.4 mg (has no administration in time range)    And  sodium chloride flush (NS) 0.9 % injection 9 mL (has no administration in time range)  ondansetron (ZOFRAN) injection 4 mg (has no administration in time range)  diphenhydrAMINE (BENADRYL) capsule 25 mg (has no administration in time range)  HYDROmorphone (DILAUDID) 1 mg/mL PCA injection (has no administration in time range)  HYDROmorphone (DILAUDID) injection 1 mg (has no administration in time range)  oxyCODONE (OXYCONTIN) 12 hr tablet 20 mg (has no administration in time range)  ketorolac (TORADOL) 15 MG/ML injection 15 mg (15 mg Intravenous Given 04/11/21 0808)   HYDROmorphone (DILAUDID) injection 2 mg (2 mg Intravenous Given 04/11/21 0808)  HYDROmorphone (DILAUDID) injection 2 mg (2 mg Intravenous Given 04/11/21 0918)  HYDROmorphone (DILAUDID) injection 2 mg (2 mg Intravenous Given 04/11/21 0958)  diphenhydrAMINE (BENADRYL) 12.5 mg in sodium chloride 0.9 % 50 mL IVPB (0 mg  Intravenous Stopped 04/11/21 1157)    ED Course/ Medical Decision Making/ A&P                           Medical Decision Making Amount and/or Complexity of Data Reviewed Labs: ordered.  Risk Prescription drug management. Decision regarding hospitalization.   This patient presents to the ED for concern of sickle cell pain, this involves an extensive number of treatment options, and is a complaint that carries with it a high risk of complications and morbidity.  The differential diagnosis includes pain crisis, infection, anemia   Co morbidities that complicate the patient evaluation  sickle cell disease, type [place], chronic pain syndrome, and obesity   Additional history obtained:  Additional history obtained from epic chart review   Lab Tests:  I Ordered, and personally interpreted labs.  The pertinent results include:  cbc with chronic anemia (hgb 11.7), cmp nl, preg neg, retic 5.1   Cardiac Monitoring:  The patient was maintained on a cardiac monitor.  I personally viewed and interpreted the cardiac monitored which showed an underlying rhythm of: nsr   Medicines ordered and prescription drug management:  I ordered medication including dilaudid  for pain  Reevaluation of the patient after these medicines showed that the patient improved, but she is still in a lot of pain. I have reviewed the patients home medicines and have made adjustments as needed   Critical Interventions:  Pain control   Consultations Obtained:  I requested consultation with the sickle cell specialist, Hart RochesterHollis, FNP,  and discussed lab and imaging findings as well as pertinent plan - she  will admit for pain control.   Problem List / ED Course:  Sickle cell pain crisis:  Pain unrelieved by 3 doses of dilaudid + toradol.    Social Determinants of Health:  Lives at home   Dispostion:  After consideration of the diagnostic results and the patients response to treatment, I feel that the patent would benefit from admission..          Final Clinical Impression(s) / ED Diagnoses Final diagnoses:  Sickle cell pain crisis [facility](HCC)    Rx / DC Orders ED Discharge Orders     None         Jacalyn LefevreHaviland, Johnnie Moten, MD 04/11/21 1212

## 2021-04-12 ENCOUNTER — Telehealth: Payer: Self-pay

## 2021-04-12 ENCOUNTER — Other Ambulatory Visit: Payer: Self-pay | Admitting: Family Medicine

## 2021-04-12 DIAGNOSIS — D5700 Hb-SS disease with crisis, unspecified: Secondary | ICD-10-CM | POA: Diagnosis not present

## 2021-04-12 DIAGNOSIS — D571 Sickle-cell disease without crisis: Secondary | ICD-10-CM

## 2021-04-12 DIAGNOSIS — F1190 Opioid use, unspecified, uncomplicated: Secondary | ICD-10-CM

## 2021-04-12 DIAGNOSIS — G894 Chronic pain syndrome: Secondary | ICD-10-CM

## 2021-04-12 LAB — CBC WITH DIFFERENTIAL/PLATELET
Abs Immature Granulocytes: 0.03 10*3/uL (ref 0.00–0.07)
Basophils Absolute: 0 10*3/uL (ref 0.0–0.1)
Basophils Relative: 0 %
Eosinophils Absolute: 0.2 10*3/uL (ref 0.0–0.5)
Eosinophils Relative: 1 %
HCT: 32.2 % — ABNORMAL LOW (ref 36.0–46.0)
Hemoglobin: 10.9 g/dL — ABNORMAL LOW (ref 12.0–15.0)
Immature Granulocytes: 0 %
Lymphocytes Relative: 22 %
Lymphs Abs: 3 10*3/uL (ref 0.7–4.0)
MCH: 23.6 pg — ABNORMAL LOW (ref 26.0–34.0)
MCHC: 33.9 g/dL (ref 30.0–36.0)
MCV: 69.7 fL — ABNORMAL LOW (ref 80.0–100.0)
Monocytes Absolute: 1.1 10*3/uL — ABNORMAL HIGH (ref 0.1–1.0)
Monocytes Relative: 8 %
Neutro Abs: 9.3 10*3/uL — ABNORMAL HIGH (ref 1.7–7.7)
Neutrophils Relative %: 69 %
Platelets: 301 10*3/uL (ref 150–400)
RBC: 4.62 MIL/uL (ref 3.87–5.11)
RDW: 17.2 % — ABNORMAL HIGH (ref 11.5–15.5)
WBC: 13.7 10*3/uL — ABNORMAL HIGH (ref 4.0–10.5)
nRBC: 0.4 % — ABNORMAL HIGH (ref 0.0–0.2)

## 2021-04-12 MED ORDER — OXYCODONE HCL 20 MG PO TABS: 20.0000 mg | ORAL_TABLET | ORAL | 0 refills | Status: DC | PRN

## 2021-04-12 NOTE — Progress Notes (Signed)
?   04/12/21 4163  ?Assess: MEWS Score  ?Temp 98.2 ?F (36.8 ?C)  ?BP 123/83  ?Pulse Rate (!) 116  ?Resp 18  ?SpO2 94 %  ?O2 Device Room Air  ?Assess: MEWS Score  ?MEWS Temp 0  ?MEWS Systolic 0  ?MEWS Pulse 2  ?MEWS RR 0  ?MEWS LOC 0  ?MEWS Score 2  ?MEWS Score Color Yellow  ?Assess: if the MEWS score is Yellow or Red  ?Were vital signs taken at a resting state? Yes  ?Focused Assessment No change from prior assessment  ?Does the patient meet 2 or more of the SIRS criteria? No  ?MEWS guidelines implemented *See Row Information* Yes  ?Take Vital Signs  ?Increase Vital Sign Frequency  Yellow: Q 2hr X 2 then Q 4hr X 2, if remains yellow, continue Q 4hrs  ?Escalate  ?MEWS: Escalate Yellow: discuss with charge nurse/RN and consider discussing with provider and RRT  ?Notify: Charge Nurse/RN  ?Name of Charge Nurse/RN Notified Renita, RN  ?Date Charge Nurse/RN Notified 04/12/21  ?Time Charge Nurse/RN Notified [phone]  ?Assess: SIRS CRITERIA  ?SIRS Temperature  0  ?SIRS Pulse 1  ?SIRS Respirations  0  ?SIRS WBC 0  ?SIRS Score Sum  1  ? ?Yellow MEWs initiated d/t increase in pulse. All other vital signs remain WDL. Pt c/o no new pain or discomfort. Charge RN notified ?

## 2021-04-12 NOTE — Discharge Summary (Signed)
Physician Discharge Summary  Stephanie Burch FUX:[id number] DOB: May 24, 1980 DOA: 04/11/2021  PCP: Massie Maroon, FNP  Admit date: 04/11/2021  Discharge date: 04/12/2021  Discharge Diagnoses:  Principal Problem:   Sickle cell pain crisis [facility]) Active Problems:   Morbid obesity with body mass index (BMI) of 45.0 to 49.9 in adult [facility])   Chronic pain syndrome   Discharge Condition: Stable  Disposition:   Follow-up Information     Massie Maroon, FNP Follow up.   Specialty: Family Medicine Contact information: [address] [place] [address] [phone]                Pt is discharged home in good condition and is to follow up with Massie Maroon, FNP this week to have labs evaluated. Stephanie Burch is instructed to increase activity slowly and balance with rest for the next few days, and use prescribed medication to complete treatment of pain  Diet: Regular Wt Readings from Last 3 Encounters:  04/11/21 136 kg  04/02/21 136.1 kg  03/19/21 (!) 140.6 kg    History of present illness:  Stephanie Burch  is a 41 y.o. female with a medical history significant for sickle cell disease, chronic pain syndrome, opiate dependence and tolerance, history of anemia of chronic disease, and morbid obesity presents with complaints of left arm and lower extremity pain.  Patient states that pain intensity has been elevated over the past 3 days and unrelieved by her home medications.  She says that this pain is typical of her sickle cell pain crisis.  She characterizes her pain as constant and sharp.  She last had Xtampza and oxycodone this a.m. without sustained relief.  Patient was previously on Adakveo infusion, however she has been lost to follow-up over the past several months.  She has not been taking folic acid lately.  She currently rates pain as 8/10.  She denies any fever, chills, chest pain, or shortness of breath.  No urinary symptoms, nausea, vomiting, or diarrhea.   No sick contacts, recent travel, or known exposure to COVID-19.   ER course: Vital signs are recorded as:BP 128/63    Pulse 96    Temp 98.5 F (36.9 C) (Oral)    Resp 13    Ht 5\' 5"  (1.651 m)    Wt 136 kg    SpO2 94%    BMI 49.89 kg/m  Comprehensive metabolic panel unremarkable.  Complete blood count shows WBCs 10.0, hemoglobin 12.3 g/dL, and platelets .  Beta-hCG negative.  COVID-19, and influenza pending.  Patient's pain persists despite IV Dilaudid, IV Toradol, and IV fluids.  Patient will be admitted to MedSurg and observation for further management of sickle cell pain crisis.  Hospital Course:  Sickle cell pain crisis: Patient was admitted for sickle cell pain crisis and managed appropriately with IVF, IV Dilaudid via PCA and IV Toradol, as well as other adjunct therapies per sickle cell pain management protocols. Pain intensity decreased overnight and patient is requesting discharge home. She will resume her home medications.   Patient was therefore discharged home today in a hemodynamically stable condition.   Stephanie Burch will follow-up with PCP within 1 week of this discharge. Stephanie Burch was counseled extensively about nonpharmacologic means of pain management, patient verbalized understanding and was appreciative of  the care received during this admission.   We discussed the need for good hydration, monitoring of hydration status, avoidance of heat, cold, stress, and infection triggers. We discussed the need to be  adherent with taking home medications. Patient was reminded of the need to seek medical attention immediately if any symptom of bleeding, anemia, or infection occurs.  Discharge Exam: Vitals:   04/12/21 1133 04/12/21 1214  BP:  (!) 111/57  Pulse:  (!) 102  Resp: 18 14  Temp:  97.8 F (36.6 C)  SpO2: 97% 96%   Vitals:   04/12/21 0611 04/12/21 0801 04/12/21 1133 04/12/21 1214  BP: 123/83   (!) 111/57  Pulse: (!) 116   (!) 102  Resp: 18 18 18 14   Temp: 98.2 F (36.8  C)   97.8 F (36.6 C)  TempSrc: Oral   Oral  SpO2: 94% 96% 97% 96%  Weight:      Height:        General appearance : Awake, alert, not in any distress. Speech Clear. Not toxic looking HEENT: Atraumatic and Normocephalic, pupils equally reactive to light and accomodation Neck: Supple, no JVD. No cervical lymphadenopathy.  Chest: Good air entry bilaterally, no added sounds  CVS: S1 S2 regular, no murmurs.  Abdomen: Bowel sounds present, Non tender and not distended with no gaurding, rigidity or rebound. Extremities: B/L Lower Ext shows no edema, both legs are warm to touch Neurology: Awake alert, and oriented X 3, CN II-XII intact, Non focal Skin: No Rash  Discharge Instructions  Discharge Instructions     Discharge patient   Complete by: As directed    Discharge disposition: 01-Home or Self Care   Discharge patient date: 04/12/2021      Allergies as of 04/12/2021   No Known Allergies      Medication List     TAKE these medications    crizanlizumab-tmca 100 MG/10ML Soln Commonly known as: ADAKVEO Inject 680 mg into the vein every 30 (thirty) days.   folic acid 1 MG tablet Commonly known as: FOLVITE Take 1 tablet (1 mg total) by mouth daily.   gabapentin 300 MG capsule Commonly known as: NEURONTIN Take 300 mg by mouth 3 (three) times daily.   HYDROmorphone 2 MG tablet Commonly known as: DILAUDID Take 2 mg by mouth.   ibuprofen 200 MG tablet Commonly known as: ADVIL Take 800-1,000 mg by mouth every 6 (six) hours as needed for fever, headache or mild pain.   ibuprofen 800 MG tablet Commonly known as: ADVIL Take by mouth.   ondansetron 4 MG tablet Commonly known as: ZOFRAN TAKE 1 TABLET BY MOUTH EVERY 8 HOURS AS NEEDED FOR NAUSEA AND VOMITING What changed: See the new instructions.   Oxycodone HCl 20 MG Tabs Take 1 tablet (20 mg total) by mouth every 4 (four) hours as needed for up to 15 days (pain).   tiZANidine 4 MG capsule Commonly known as:  ZANAFLEX Take 4 mg by mouth 3 (three) times daily.   valACYclovir 500 MG tablet Commonly known as: VALTREX Take 500 mg by mouth daily.   Xtampza ER 18 MG C12a Generic drug: oxyCODONE ER Take 18 mg by mouth every 12 (twelve) hours.        The results of significant diagnostics from this hospitalization (including imaging, microbiology, ancillary and laboratory) are listed below for reference.    Significant Diagnostic Studies: DG Chest 2 View  Result Date: 04/02/2021 CLINICAL DATA:  Shortness of breath and weakness. History of sickle cell anemia. EXAM: CHEST - 2 VIEW COMPARISON:  Radiographs 10/19/2020 and 07/15/2020. FINDINGS: The heart size and mediastinal contours are stable. Stable minimal atelectasis or scarring at both lung bases. The lungs are  otherwise clear. There is no pleural effusion or pneumothorax. The bones appear unremarkable. Telemetry leads overlie the chest. IMPRESSION: No active cardiopulmonary process. Electronically Signed   By: Carey Bullocks M.D.   On: 04/02/2021 08:39   CT Angio Chest PE W and/or Wo Contrast  Result Date: 04/02/2021 CLINICAL DATA:  Chest pain or shortness of breath. Pleurisy or effusion suspected. Sickle cell pain in both legs and left arm for 2 days. Children diagnosed with COVID on 03/25/2021. Shortness of breath since last week. EXAM: CT ANGIOGRAPHY CHEST WITH CONTRAST TECHNIQUE: Multidetector CT imaging of the chest was performed using the standard protocol during bolus administration of intravenous contrast. Multiplanar CT image reconstructions and MIPs were obtained to evaluate the vascular anatomy. RADIATION DOSE REDUCTION: This exam was performed according to the departmental dose-optimization program which includes automated exposure control, adjustment of the mA and/or kV according to patient size and/or use of iterative reconstruction technique. CONTRAST:  OMNIPAQUE IOHEXOL 350 MG/ML SOLN COMPARISON:  Chest two views 04/02/2021, AP  chest 10/19/2020; CTA chest 11/28/2019 and 04/19/2019 FINDINGS: Cardiovascular: The main pulmonary artery is opacified up to 153 Hounsfield units. There is mild respiratory and cardiac motion artifact. No large main left main right main, or lobar filling defect is seen to indicate a pulmonary embolism. The more distal pulmonary arteries are not well evaluated due to the technical limitations. The main pulmonary artery measures up to 3.6 cm in caliber similar to prior and again enlarged as can be seen with chronic pulmonary arterial hypertension. No bulging of the intra-atrial septum to indicate CT evidence of right heart strain. Heart size is again mildly enlarged. No pericardial effusion. No thoracic aortic aneurysm. Mediastinum/Nodes: No axillary, mediastinal or hilar pathologically enlarged lymph nodes by CT criteria. The visualized thyroid is unremarkable. The esophagus follows a normal course and normal caliber. Lungs/Pleura: The central airways are patent. Respiratory motion artifact markedly limits evaluation of the lung parenchyma. Resolution of the prior moderate bilateral pleural effusions. Mild bibasilar scattered ground-glass opacification and linear right lower lobe and lingular subsegmental atelectasis versus scarring. Lateral right lower lobe 6 mm nodule (series 7, image 61) is unchanged from 04/19/2019 and benign. Left lower lobe 4 mm nodule (series 7, image 58) is unchanged from 04/19/2019 and benign. Upper Abdomen: The spleen is again partially visualized and heterogeneous, similar to prior. Normal adrenals. Musculoskeletal: Mild multilevel degenerative disc changes of the thoracic spine. The bilateral humeral heads are again partially visualized. There is again patchy sclerosis and lucency within the bilateral humeral heads compatible with avascular necrosis. Review of the MIP images confirms the above findings. IMPRESSION:: IMPRESSION: 1. No large central pulmonary embolism is seen through the  lobar pulmonary arteries. Again evaluation of the more distal pulmonary arteries is limited due to technical factors. 2. Unchanged mild cardiomegaly. 3. The bilateral pleural effusion seen on prior 11/28/2019 CT have resolved. No significant lung consolidation to indicate pneumonia. 4. 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 Electronically Signed   By: Neita Garnet M.D.   On: 04/02/2021 12:47    Microbiology: Recent Results (from the past 240 hour(s))  Resp Panel by RT-PCR (Flu A&B, Covid) Nasopharyngeal Swab     Status: None   Collection Time: 04/11/21 11:58 AM   Specimen: Nasopharyngeal Swab; Nasopharyngeal(NP) swabs in vial transport medium  Result Value Ref Range Status   SARS Coronavirus 2 by RT PCR NEGATIVE NEGATIVE Final    Comment: (NOTE) SARS-CoV-2 target nucleic acids are NOT DETECTED.  The SARS-CoV-2 RNA is generally detectable in upper respiratory specimens during the acute phase of infection. The lowest concentration of SARS-CoV-2 viral copies this assay can detect is 138 copies/mL. A negative result does not preclude SARS-Cov-2 infection and should not be used as the sole basis for treatment or other patient management decisions. A negative result may occur with  improper specimen collection/handling, submission of specimen other than nasopharyngeal swab, presence of viral mutation(s) within the areas targeted by this assay, and inadequate number of viral copies(<138 copies/mL). A negative result must be combined with clinical observations, patient history, and epidemiological information. The expected result is Negative.  Fact Sheet for Patients:  BloggerCourse.com  Fact Sheet for Healthcare Providers:  SeriousBroker.it  This test is no t yet approved or cleared by the [place] FDA and  has been authorized for detection and/or diagnosis of SARS-CoV-2 by FDA under an Emergency Use  Authorization (EUA). This EUA will remain  in effect (meaning this test can be used) for the duration of the COVID-19 declaration under Section 564(b)(1) of the Act, 21 U.S.C.section 360bbb-3(b)(1), unless the authorization is terminated  or revoked sooner.       Influenza A by PCR NEGATIVE NEGATIVE Final   Influenza B by PCR NEGATIVE NEGATIVE Final    Comment: (NOTE) The Xpert Xpress SARS-CoV-2/FLU/RSV plus assay is intended as an aid in the diagnosis of influenza from Nasopharyngeal swab specimens and should not be used as a sole basis for treatment. Nasal washings and aspirates are unacceptable for Xpert Xpress SARS-CoV-2/FLU/RSV testing.  Fact Sheet for Patients: BloggerCourse.com  Fact Sheet for Healthcare Providers: SeriousBroker.it  This test is not yet approved or cleared by the [place] FDA and has been authorized for detection and/or diagnosis of SARS-CoV-2 by FDA under an Emergency Use Authorization (EUA). This EUA will remain in effect (meaning this test can be used) for the duration of the COVID-19 declaration under Section 564(b)(1) of the Act, 21 U.S.C. section 360bbb-3(b)(1), unless the authorization is terminated or revoked.  Performed at [facility], [address] [place], [place] [address]      Labs: Basic Metabolic Panel: Recent Labs  Lab 04/11/21 0808  NA 138  K 3.9  CL 104  CO2 26  GLUCOSE 121*  BUN 9  CREATININE 0.64  CALCIUM 9.0   Liver Function Tests: Recent Labs  Lab 04/11/21 0808  AST 11*  ALT 11  ALKPHOS 81  BILITOT 0.7  PROT 7.7  ALBUMIN 3.8   No results for input(s): LIPASE, AMYLASE in the last 168 hours. No results for input(s): AMMONIA in the last 168 hours. CBC: Recent Labs  Lab 04/11/21 0808 04/12/21 0527  WBC 8.9 13.7*  NEUTROABS 6.1 9.3*  HGB 11.7* 10.9*  HCT 34.4* 32.2*  MCV 68.3* 69.7*  PLT 349 301   Cardiac Enzymes: No results  for input(s): CKTOTAL, CKMB, CKMBINDEX, TROPONINI in the last 168 hours. BNP: Invalid input(s): POCBNP CBG: No results for input(s): GLUCAP in the last 168 hours.  Time coordinating discharge: 50 minutes  Signed:  Nolon Nations  APRN, MSN, FNP-C Patient Care [facility] [address] [place], [place] [address] [phone]  [facility] 04/12/2021, 1:21 PM

## 2021-04-12 NOTE — Progress Notes (Signed)
Reviewed PDMP substance reporting system prior to prescribing opiate medications. No inconsistencies noted.  Meds ordered this encounter  Medications   Oxycodone HCl 20 MG TABS    Sig: Take 1 tablet (20 mg total) by mouth every 4 (four) hours as needed for up to 15 days (pain).    Dispense:  90 tablet    Refill:  0    Order Specific Question:   Supervising Provider    Answer:   JEGEDE, OLUGBEMIGA E [[id number]]     Stephanie Urbanczyk Moore Kampbell Holaway  APRN, MSN, FNP-C [facility] [address]  , [place] [address] [phone]  

## 2021-04-12 NOTE — Telephone Encounter (Signed)
Oxycodone 20 mg °

## 2021-04-23 ENCOUNTER — Ambulatory Visit: Payer: Self-pay | Admitting: Family Medicine

## 2021-04-26 ENCOUNTER — Other Ambulatory Visit: Payer: Self-pay | Admitting: Family Medicine

## 2021-04-26 ENCOUNTER — Telehealth: Payer: Self-pay | Admitting: Family Medicine

## 2021-04-26 DIAGNOSIS — G894 Chronic pain syndrome: Secondary | ICD-10-CM

## 2021-04-26 DIAGNOSIS — D571 Sickle-cell disease without crisis: Secondary | ICD-10-CM

## 2021-04-26 DIAGNOSIS — F1190 Opioid use, unspecified, uncomplicated: Secondary | ICD-10-CM

## 2021-04-26 MED ORDER — OXYCODONE HCL 20 MG PO TABS: 20.0000 mg | ORAL_TABLET | ORAL | 0 refills | Status: DC | PRN

## 2021-04-26 NOTE — Progress Notes (Signed)
Reviewed PDMP substance reporting system prior to prescribing opiate medications. No inconsistencies noted.  Meds ordered this encounter  Medications   Oxycodone HCl 20 MG TABS    Sig: Take 1 tablet (20 mg total) by mouth every 4 (four) hours as needed for up to 15 days (pain).    Dispense:  90 tablet    Refill:  0    Order Specific Question:   Supervising Provider    Answer:   JEGEDE, OLUGBEMIGA E [[id number]]     Stephanie Burch Stephanie Buren  APRN, MSN, FNP-C [facility] [address]  [place], [place] [address] [phone]  

## 2021-04-26 NOTE — Telephone Encounter (Signed)
Oxycodone refill

## 2021-05-03 ENCOUNTER — Ambulatory Visit (INDEPENDENT_AMBULATORY_CARE_PROVIDER_SITE_OTHER): Payer: Medicare Other | Admitting: Clinical

## 2021-05-03 DIAGNOSIS — F419 Anxiety disorder, unspecified: Secondary | ICD-10-CM | POA: Diagnosis not present

## 2021-05-03 NOTE — BH Specialist Note (Signed)
Integrated Behavioral Health via Telemedicine Visit ? ?05/03/2021 ?Stephanie Burch ?[id number] ? ?Number of [place] Clinician visits: 1- Initial Visit ? ?Session Start time: 1300 ?  ?Session End time: 1400 ? ?Total time in minutes: 60 ? ? ?Referring Provider: [name], NP ?Patient/Family location: [place] ?[facility] Provider location: [facility] [place] ?All persons participating in visit: patient, CSW ?Types of Service: Individual psychotherapy and Video visit ? ?I connected with Stephanie Burch via Clinical biochemist and verified that I am speaking with the correct person using two identifiers. Discussed confidentiality: Yes  ? ?I discussed the limitations of telemedicine and the availability of in person appointments.  Discussed there is a possibility of technology failure and discussed alternative modes of communication if that failure occurs. ? ?I discussed that engaging in this telemedicine visit, they consent to the provision of behavioral healthcare and the services will be billed under their insurance. ? ?Patient and/or legal guardian expressed understanding and consented to Telemedicine visit: Yes  ? ?Presenting Concerns: ?Patient and/or family reports the following symptoms/concerns: fear about sickle cell pain, interpersonal conflict ?Duration of problem: several years; Severity of problem: moderate ? ?Patient and/or Family's Strengths/Protective Factors: ?Social connections, Social and Emotional competence, Concrete supports in place (healthy food, safe environments, etc.), and Sense of purpose ? ?Goals Addressed: ?Patient will: ? Reduce symptoms of: anxiety  ? Increase knowledge and/or ability of: coping skills and self-management skills  ? Demonstrate ability to: Increase healthy adjustment to current life circumstances ? ?Progress towards Goals: ?Ongoing ? ?Interventions: ?Interventions utilized:  CBT Cognitive Behavioral  Therapy and Supportive Counseling ?Standardized Assessments completed: Not Needed ? ?Patient requested to restart sessions after approximately one year since last session. Supportive counseling and assessment today to determine patient's progress and challenges recently. Patient reported she got into a place of just expecting pain and expecting to be in the hospital, and is now working on changing those negative thoughts. CBT today to explore patient's thoughts and emotions around living with pain. Some brief challenging of anxieties and thoughts that exacerbate pain. Also supportive counseling around intimate partner relationship dynamics.  ? ?Patient and/or Family Response: Patient engaged in session.  ? ?Assessment: ?Patient currently experiencing anxiety related to pain expectation and avoidance.   ?  ?Patient may benefit from supportive counseling including CBT and ACT approaches to challenging and thoughts and core beliefs about self, relationships, and pain. ? ?Plan: ?Follow up with behavioral health clinician on: 05/13/21 ?Referral(s): [place] (In Clinic) ? ?I discussed the assessment and treatment plan with the patient and/or parent/guardian. They were provided an opportunity to ask questions and all were answered. They agreed with the plan and demonstrated an understanding of the instructions. ?  ?They were advised to call back or seek an in-person evaluation if the symptoms worsen or if the condition fails to improve as anticipated. ? ?Estanislado Emms, LCSW ?[facility] [place] ?[phone] ? ?

## 2021-05-09 ENCOUNTER — Non-Acute Institutional Stay (HOSPITAL_BASED_OUTPATIENT_CLINIC_OR_DEPARTMENT_OTHER)
Admission: AD | Admit: 2021-05-09 | Discharge: 2021-05-09 | Disposition: A | Discharge status: Home / Self Care | Payer: Medicare Other | Source: Ambulatory Visit | Attending: Internal Medicine | Admitting: Internal Medicine

## 2021-05-09 ENCOUNTER — Encounter (HOSPITAL_COMMUNITY): Payer: Self-pay | Admitting: Internal Medicine

## 2021-05-09 ENCOUNTER — Other Ambulatory Visit: Payer: Self-pay | Admitting: Family Medicine

## 2021-05-09 ENCOUNTER — Emergency Department (HOSPITAL_COMMUNITY)
Admission: EM | Admit: 2021-05-09 | Discharge: 2021-05-09 | Disposition: A | Discharge status: Home / Self Care | Payer: Medicare Other | Attending: Emergency Medicine | Admitting: Emergency Medicine

## 2021-05-09 ENCOUNTER — Other Ambulatory Visit: Payer: Self-pay

## 2021-05-09 ENCOUNTER — Encounter (HOSPITAL_COMMUNITY): Payer: Self-pay

## 2021-05-09 DIAGNOSIS — D638 Anemia in other chronic diseases classified elsewhere: Secondary | ICD-10-CM | POA: Insufficient documentation

## 2021-05-09 DIAGNOSIS — D5700 Hb-SS disease with crisis, unspecified: Secondary | ICD-10-CM | POA: Insufficient documentation

## 2021-05-09 DIAGNOSIS — G894 Chronic pain syndrome: Secondary | ICD-10-CM | POA: Insufficient documentation

## 2021-05-09 DIAGNOSIS — D571 Sickle-cell disease without crisis: Secondary | ICD-10-CM

## 2021-05-09 DIAGNOSIS — F1190 Opioid use, unspecified, uncomplicated: Secondary | ICD-10-CM

## 2021-05-09 DIAGNOSIS — Z79891 Long term (current) use of opiate analgesic: Secondary | ICD-10-CM | POA: Insufficient documentation

## 2021-05-09 DIAGNOSIS — M79602 Pain in left arm: Secondary | ICD-10-CM | POA: Diagnosis present

## 2021-05-09 LAB — CBC WITH DIFFERENTIAL/PLATELET
Abs Immature Granulocytes: 0.02 10*3/uL (ref 0.00–0.07)
Basophils Absolute: 0 10*3/uL (ref 0.0–0.1)
Basophils Relative: 0 %
Eosinophils Absolute: 0.2 10*3/uL (ref 0.0–0.5)
Eosinophils Relative: 3 %
HCT: 35.4 % — ABNORMAL LOW (ref 36.0–46.0)
Hemoglobin: 11.9 g/dL — ABNORMAL LOW (ref 12.0–15.0)
Immature Granulocytes: 0 %
Lymphocytes Relative: 25 %
Lymphs Abs: 2.3 10*3/uL (ref 0.7–4.0)
MCH: 23.2 pg — ABNORMAL LOW (ref 26.0–34.0)
MCHC: 33.6 g/dL (ref 30.0–36.0)
MCV: 68.9 fL — ABNORMAL LOW (ref 80.0–100.0)
Monocytes Absolute: 0.6 10*3/uL (ref 0.1–1.0)
Monocytes Relative: 7 %
Neutro Abs: 5.9 10*3/uL (ref 1.7–7.7)
Neutrophils Relative %: 65 %
Platelets: 313 10*3/uL (ref 150–400)
RBC: 5.14 MIL/uL — ABNORMAL HIGH (ref 3.87–5.11)
RDW: 16 % — ABNORMAL HIGH (ref 11.5–15.5)
WBC: 9.1 10*3/uL (ref 4.0–10.5)
nRBC: 0 % (ref 0.0–0.2)

## 2021-05-09 LAB — COMPREHENSIVE METABOLIC PANEL
ALT: 14 U/L (ref 0–44)
AST: 14 U/L — ABNORMAL LOW (ref 15–41)
Albumin: 3.5 g/dL (ref 3.5–5.0)
Alkaline Phosphatase: 75 U/L (ref 38–126)
Anion gap: 5 (ref 5–15)
BUN: 7 mg/dL (ref 6–20)
CO2: 25 mmol/L (ref 22–32)
Calcium: 9.1 mg/dL (ref 8.9–10.3)
Chloride: 112 mmol/L — ABNORMAL HIGH (ref 98–111)
Creatinine, Ser: 0.57 mg/dL (ref 0.44–1.00)
GFR, Estimated: >60 mL/min (ref >60)
Glucose, Bld: 129 mg/dL — ABNORMAL HIGH (ref 70–99)
Potassium: 3.9 mmol/L (ref 3.5–5.1)
Sodium: 142 mmol/L (ref 135–145)
Total Bilirubin: 0.7 mg/dL (ref 0.3–1.2)
Total Protein: 7.3 g/dL (ref 6.5–8.1)

## 2021-05-09 LAB — I-STAT BETA HCG BLOOD, ED (MC, WL, AP ONLY): I-stat hCG, quantitative: <5 m[IU]/mL (ref <5)

## 2021-05-09 LAB — RETICULOCYTES
Immature Retic Fract: 20.9 % — ABNORMAL HIGH (ref 2.3–15.9)
RBC.: 5.04 MIL/uL (ref 3.87–5.11)
Retic Count, Absolute: 116.9 10*3/uL (ref 19.0–186.0)
Retic Ct Pct: 2.3 % (ref 0.4–3.1)

## 2021-05-09 MED ORDER — ONDANSETRON HCL 4 MG/2ML IJ SOLN: 4.0000 mg | Freq: Four times a day (QID) | INTRAMUSCULAR | Status: DC | PRN

## 2021-05-09 MED ORDER — OXYCODONE HCL 20 MG PO TABS: 20.0000 mg | ORAL_TABLET | ORAL | 0 refills | Status: DC | PRN

## 2021-05-09 MED ORDER — HYDROMORPHONE HCL 1 MG/ML IJ SOLN
1.0000 mg | Freq: Once | INTRAMUSCULAR | Status: AC
  Administered 2021-05-09: 1 mg via INTRAVENOUS
  Filled 2021-05-09: qty 1

## 2021-05-09 MED ORDER — OXYCODONE HCL 5 MG PO TABS
20.0000 mg | ORAL_TABLET | Freq: Once | ORAL | Status: AC
  Administered 2021-05-09: 20 mg via ORAL
  Filled 2021-05-09: qty 4

## 2021-05-09 MED ORDER — HYDROMORPHONE 1 MG/ML IV SOLN
INTRAVENOUS | Status: DC
  Administered 2021-05-09: 8.2 mg via INTRAVENOUS
  Filled 2021-05-09: qty 30

## 2021-05-09 MED ORDER — SODIUM CHLORIDE 0.45 % IV SOLN: INTRAVENOUS | Status: DC

## 2021-05-09 MED ORDER — ONDANSETRON HCL 4 MG/2ML IJ SOLN
4.0000 mg | Freq: Once | INTRAMUSCULAR | Status: AC
Start: 2021-05-09 — End: 2021-05-09
  Administered 2021-05-09: 4 mg via INTRAVENOUS
  Filled 2021-05-09: qty 2

## 2021-05-09 MED ORDER — DIPHENHYDRAMINE HCL 50 MG/ML IJ SOLN
12.5000 mg | Freq: Once | INTRAMUSCULAR | Status: AC
Start: 2021-05-09 — End: 2021-05-09
  Administered 2021-05-09: 12.5 mg via INTRAVENOUS
  Filled 2021-05-09: qty 1

## 2021-05-09 MED ORDER — DIPHENHYDRAMINE HCL 25 MG PO CAPS: 25.0000 mg | ORAL_CAPSULE | ORAL | Status: DC | PRN

## 2021-05-09 MED ORDER — DEXTROSE-NACL 5-0.45 % IV SOLN: INTRAVENOUS | Status: DC

## 2021-05-09 MED ORDER — HYDROMORPHONE HCL 1 MG/ML IJ SOLN
0.5000 mg | INTRAMUSCULAR | Status: AC
  Administered 2021-05-09: 0.5 mg via SUBCUTANEOUS
  Filled 2021-05-09: qty 1

## 2021-05-09 MED ORDER — NALOXONE HCL 0.4 MG/ML IJ SOLN: 0.4000 mg | INTRAMUSCULAR | Status: DC | PRN

## 2021-05-09 MED ORDER — ACETAMINOPHEN 500 MG PO TABS
1000.0000 mg | ORAL_TABLET | Freq: Once | ORAL | Status: AC
  Administered 2021-05-09: 1000 mg via ORAL
  Filled 2021-05-09: qty 2

## 2021-05-09 MED ORDER — HYDROMORPHONE HCL 1 MG/ML IJ SOLN: 0.5000 mg | INTRAMUSCULAR | Status: DC

## 2021-05-09 MED ORDER — HYDROMORPHONE HCL 2 MG/ML IJ SOLN
2.0000 mg | Freq: Once | INTRAMUSCULAR | Status: AC
Start: 2021-05-09 — End: 2021-05-09
  Administered 2021-05-09: 2 mg via INTRAVENOUS
  Filled 2021-05-09: qty 1

## 2021-05-09 MED ORDER — KETOROLAC TROMETHAMINE 30 MG/ML IJ SOLN
15.0000 mg | Freq: Once | INTRAMUSCULAR | Status: AC
  Administered 2021-05-09: 15 mg via INTRAVENOUS
  Filled 2021-05-09: qty 1

## 2021-05-09 MED ORDER — HYDROMORPHONE HCL 2 MG/ML IJ SOLN
2.0000 mg | Freq: Once | INTRAMUSCULAR | Status: AC
  Administered 2021-05-09: 2 mg via INTRAVENOUS
  Filled 2021-05-09: qty 1

## 2021-05-09 MED ORDER — SODIUM CHLORIDE 0.9% FLUSH: 9.0000 mL | INTRAVENOUS | Status: DC | PRN

## 2021-05-09 NOTE — Discharge Summary (Signed)
[facility] Discharge Summary  ? ?Patient ID: ?Stephanie Burch ?MRN: [id number] ?DOB/AGE: Jun 07, 1980 41 y.o. ? ?Admit date: 05/09/2021 ?Discharge date: 05/09/2021 ? ?Primary Care Physician:  Massie Maroon, FNP ? ?Admission Diagnoses:  ?Active Problems: ?  Sickle cell pain crisis (HCC) ? ? ?Discharge Medications: ? ?Allergies as of 05/09/2021   ?No Known Allergies ?  ? ?  ?Medication List  ?  ? ?STOP taking these medications   ? ?HYDROmorphone 2 MG tablet ?Commonly known as: DILAUDID ?  ? ?  ? ?TAKE these medications   ? ?crizanlizumab-tmca 100 MG/10ML Soln ?Commonly known as: ADAKVEO ?Inject 680 mg into the vein every 30 (thirty) days. ?  ?folic acid 1 MG tablet ?Commonly known as: FOLVITE ?Take 1 tablet (1 mg total) by mouth daily. ?  ?gabapentin 300 MG capsule ?Commonly known as: NEURONTIN ?Take 300 mg by mouth 3 (three) times daily. ?  ?ibuprofen 200 MG tablet ?Commonly known as: ADVIL ?Take 800-1,000 mg by mouth every 6 (six) hours as needed for fever, headache or mild pain. ?  ?ibuprofen 800 MG tablet ?Commonly known as: ADVIL ?Take by mouth. ?  ?ondansetron 4 MG tablet ?Commonly known as: ZOFRAN ?TAKE 1 TABLET BY MOUTH EVERY 8 HOURS AS NEEDED FOR NAUSEA AND VOMITING ?What changed: See the new instructions. ?  ?Oxycodone HCl 20 MG Tabs ?Take 1 tablet (20 mg total) by mouth every 4 (four) hours as needed for up to 15 days (pain). ?Start taking on: May 16, 2021 ?What changed: These instructions start on May 16, 2021. If you are unsure what to do until then, ask your doctor or other care provider. ?  ?tiZANidine 4 MG capsule ?Commonly known as: ZANAFLEX ?Take 4 mg by mouth 3 (three) times daily. ?  ?valACYclovir 500 MG tablet ?Commonly known as: VALTREX ?Take 500 mg by mouth daily. ?  ? ?  ? ? ? ?Consults:  None ? ?Significant Diagnostic Studies:  ?No results found. ? ?History of present illness: ?Stephanie Burch is a 41 year old female with a medical history significant for sickle cell disease,  chronic pain syndrome, opiate dependence and and history of anemia of chronic disease presents with complaints of lower extremity pain that is consistent with his previous sickle cell pain crisis.  Patient was treated and evaluated in the emergency department this a.m. agree with EDP that patient is appropriate to transition to sickle cell day infusion clinic.  Patient states that pain intensity is 9/10 characterized as constant and throbbing.  She states that pain intensity has been elevated over the past several days and has been unrelieved by her home medications.  She has not identified any provocative factors concerning crisis.  Patient last had Xtampza and oxycodone this a.m. without sustained relief.  She denies any fever, chills, chest pain, or shortness of breath.  No urinary symptoms, nausea, vomiting, or diarrhea ? ?[facility] Course: ?Patient admitted to sickle cell day infusion clinic for management of pain crisis. ?Reviewed laboratory values from the emergency department, did not warrant repeating. ?Initiated IV Dilaudid PCA ?Toradol 15 mg IV x1 ?Tylenol 1000 mg x 1 ?IV fluids, 0.45% saline at 100 mL/h ?Patient's pain intensity decreased to 6/10.  She is alert, oriented, and ambulating without assistance.  Patient will be discharged home in a hemodynamically stable condition. ? ?Discharge instructions: ?Resume all home medications.  ? ?Follow up with PCP as previously  scheduled.  ? ?Discussed the importance of drinking 64 ounces of water daily, dehydration  of red blood cells may lead further sickling.  ? ?Avoid all stressors that precipitate sickle cell pain crisis.   ? ? ?The patient was given clear instructions to go to ER or return to medical center if symptoms do not improve, worsen or new problems develop.   ? ?Physical Exam at Discharge: ? ?BP 133/79 (BP Location: Left Arm)   Pulse 92   Temp 98.6 ?F (37 ?C) (Oral)   Resp 17   LMP 05/06/2021   SpO2 99%  ?Physical  Exam ?Constitutional:   ?   Appearance: She is obese.  ?Eyes:  ?   Pupils: Pupils are equal, round, and reactive to light.  ?Cardiovascular:  ?   Rate and Rhythm: Normal rate and regular rhythm.  ?Pulmonary:  ?   Effort: Pulmonary effort is normal.  ?Abdominal:  ?   General: Bowel sounds are normal.  ?Skin: ?   General: Skin is warm.  ?Neurological:  ?   General: No focal deficit present.  ?   Mental Status: Mental status is at baseline.  ?Psychiatric:     ?   Mood and Affect: Mood normal.     ?   Behavior: Behavior normal.     ?   Thought Content: Thought content normal.     ?   Judgment: Judgment normal.  ?  ? ?Disposition at Discharge: ?Discharge disposition: 01-Home or Self Care ? ? ? ? ? ? ?Discharge Orders: ? ? ?Condition at Discharge:   Stable ? ?Time spent on Discharge:  Greater than 30 minutes. ? ?Signed: ?Nolon Nations  APRN, MSN, FNP-C ?[facility] ?[address]  ?[place], [place] [address] ?[phone]  ?05/09/2021, 5:18 PM ?

## 2021-05-09 NOTE — Progress Notes (Signed)
Patient admitted to the day infusion hospital from [facility] for sickle cell pain. Initially, patient reported bilateral arm and leg pain rated 7/10. For pain management, patient placed on Dilaudid PCA, given Tylenol, Toradol and hydrated with IV fluids. At discharge, patient rated pain at 5/10. Vital signs stable. AVS offered but patient refused. Patient alert, oriented and ambulatory at discharge.   ?

## 2021-05-09 NOTE — ED Provider Notes (Signed)
?[facility]-EMERGENCY DEPT ?Provider Note ? ? ?CSN: [id number] ?Arrival date & time: 05/09/21  0710 ? ?  ? ?History ? ?Chief Complaint  ?Patient presents with  ? Sickle Cell Pain Crisis  ? ? ?Stephanie Burch is a 41 y.o. female who presents emergency department complaining of sickle cell pain in her left arm and bilateral lower extremities for 3 days. No fever, chest pain, or difficulty breathing. No numbness. No relief with at oxycodone. Most recently admitted to [facility] on 3/12 for sickle cell crisis.  ? ? ?Sickle Cell Pain Crisis ?Associated symptoms: nausea   ?Associated symptoms: no chest pain, no fever, no shortness of breath and no vomiting   ? ?  ? ?Home Medications ?Prior to Admission medications   ?Medication Sig Start Date End Date Taking? Authorizing Provider  ?crizanlizumab-tmca (ADAKVEO) 100 MG/10ML SOLN Inject 680 mg into the vein every 30 (thirty) days. 07/20/20   [provider]  ?folic acid (FOLVITE) 1 MG tablet Take 1 tablet (1 mg total) by mouth daily. ?Patient not taking: Reported on 04/11/2021 11/15/18   Kallie LocksStroud, [name], FNP  ?gabapentin (NEURONTIN) 300 MG capsule Take 300 mg by mouth 3 (three) times daily. ?Patient not taking: Reported on 02/12/2021    [provider]  ?HYDROmorphone (DILAUDID) 2 MG tablet Take 2 mg by mouth. ?Patient not taking: Reported on 04/11/2021 03/06/21   [provider]  ?ibuprofen (ADVIL) 200 MG tablet Take 800-1,000 mg by mouth every 6 (six) hours as needed for fever, headache or mild pain.    [provider]  ?ibuprofen (ADVIL) 800 MG tablet Take by mouth. ?Patient not taking: Reported on 04/11/2021    [provider]  ?ondansetron (ZOFRAN) 4 MG tablet TAKE 1 TABLET BY MOUTH EVERY 8 HOURS AS NEEDED FOR NAUSEA AND VOMITING ?Patient taking differently: Take 4 mg by mouth every 8 (eight) hours as needed for vomiting or nausea. 12/24/20   Massie MaroonHollis, Lachina M, FNP  ?Oxycodone HCl 20 MG TABS Take 1  tablet (20 mg total) by mouth every 4 (four) hours as needed for up to 15 days (pain). 05/02/21 05/17/21  Massie MaroonHollis, Lachina M, FNP  ?tiZANidine (ZANAFLEX) 4 MG capsule Take 4 mg by mouth 3 (three) times daily. ?Patient not taking: Reported on 02/12/2021 10/07/20   [provider]  ?valACYclovir (VALTREX) 500 MG tablet Take 500 mg by mouth daily.    [provider]  ?   ? ?Allergies    ?Patient has no known allergies.   ? ?Review of Systems   ?Review of Systems  ?Constitutional:  Negative for fever.  ?Respiratory:  Negative for shortness of breath.   ?Cardiovascular:  Negative for chest pain.  ?Gastrointestinal:  Positive for nausea. Negative for abdominal pain and vomiting.  ?Musculoskeletal:  Positive for myalgias.  ?Skin:  Negative for wound.  ?All other systems reviewed and are negative. ? ?Physical Exam ?Updated Vital Signs ?BP 122/82 (BP Location: Left Wrist)   Pulse 87   Temp 98.5 ?F (36.9 ?C) (Oral)   Resp 17   Ht 5\' 5"  (1.651 m)   Wt 136.1 kg   LMP 05/06/2021   SpO2 100%   BMI 49.92 kg/m?  ?Physical Exam ?Vitals and nursing note reviewed.  ?Constitutional:   ?   Appearance: Normal appearance.  ?   Comments: Patient is tearful  ?HENT:  ?   Head: Normocephalic and atraumatic.  ?Eyes:  ?   Conjunctiva/sclera: Conjunctivae normal.  ?Cardiovascular:  ?  Rate and Rhythm: Normal rate and regular rhythm.  ?   Pulses:     ?     Radial pulses are 2+ on the right side and 2+ on the left side.  ?     Dorsalis pedis pulses are 2+ on the right side and 2+ on the left side.  ?Pulmonary:  ?   Effort: Pulmonary effort is normal. No respiratory distress.  ?   Breath sounds: Normal breath sounds.  ?Abdominal:  ?   General: There is no distension.  ?   Palpations: Abdomen is soft.  ?   Tenderness: There is no abdominal tenderness.  ?Musculoskeletal:  ?   Right upper arm: Normal.  ?   Left upper arm: Normal.  ?   Right forearm: Normal.  ?   Left forearm: Normal.  ?   Right lower leg: No edema.  ?   Left lower  leg: No edema.  ?Skin: ?   General: Skin is warm and dry.  ?Neurological:  ?   General: No focal deficit present.  ?   Mental Status: She is alert.  ? ? ?ED Results / Procedures / Treatments   ?Labs ?(all labs ordered are listed, but only abnormal results are displayed) ?Labs Reviewed  ?COMPREHENSIVE METABOLIC PANEL - Abnormal; Notable for the following components:  ?    Result Value  ? Chloride 112 (*)   ? Glucose, Bld 129 (*)   ? AST 14 (*)   ? All other components within normal limits  ?CBC WITH DIFFERENTIAL/PLATELET - Abnormal; Notable for the following components:  ? RBC 5.14 (*)   ? Hemoglobin 11.9 (*)   ? HCT 35.4 (*)   ? MCV 68.9 (*)   ? MCH 23.2 (*)   ? RDW 16.0 (*)   ? All other components within normal limits  ?RETICULOCYTES - Abnormal; Notable for the following components:  ? Immature Retic Fract 20.9 (*)   ? All other components within normal limits  ?I-STAT BETA HCG BLOOD, ED ([facility], [facility], [facility] ONLY)  ? ? ?EKG ?None ? ?Radiology ?No results found. ? ?Procedures ?Mar[facility] KitchenCritical Care ?Performed by: Su Monks, PA-C ?Authorized by: Su Monks, PA-C  ? ?Critical care provider statement:  ?  Critical care time (minutes):  30 ?  Critical care was necessary to treat or prevent imminent or life-threatening deterioration of the following conditions: Sickle cell crisis. ?  Critical care was time spent personally by me on the following activities:  Development of treatment plan with patient or surrogate, discussions with consultants, evaluation of patient's response to treatment, examination of patient, ordering and review of laboratory studies, ordering and review of radiographic studies, ordering and performing treatments and interventions, pulse oximetry, re-evaluation of patient's condition and review of old charts  ? ? ?Medications Ordered in ED ?Medications  ?dextrose 5 %-0.45 % sodium chloride infusion ( Intravenous New Bag/Given 05/09/21 0753)  ?HYDROmorphone (DILAUDID) injection 0.5 mg (0.5 mg  Subcutaneous Given 05/09/21 0737)  ?diphenhydrAMINE (BENADRYL) injection 12.5 mg (12.5 mg Intravenous Given 05/09/21 0754)  ?ondansetron [facility]) injection 4 mg (4 mg Intravenous Given 05/09/21 0754)  ?HYDROmorphone (DILAUDID) injection 2 mg (2 mg Intravenous Given 05/09/21 0805)  ?HYDROmorphone (DILAUDID) injection 2 mg (2 mg Intravenous Given 05/09/21 0831)  ?HYDROmorphone (DILAUDID) injection 2 mg (2 mg Intravenous Given 05/09/21 0932)  ?HYDROmorphone (DILAUDID) injection 1 mg (1 mg Intravenous Given 05/09/21 1008)  ?oxyCODONE (Oxy IR/ROXICODONE) immediate release tablet 20 mg (20 mg Oral Given 05/09/21 1130)  ? ? ?  ED Course/ Medical Decision Making/ A&P ?  ?                        ?Medical Decision Making ?Amount and/or Complexity of Data Reviewed ?Labs: ordered. ? ?Risk ?Prescription drug management. ? ? ?This patient presents to the ED for concern of sickle cell crisis, this involves an extensive number of treatment options, and is a complaint that carries with it a high risk of complications and morbidity.  ? ?Past Medical History / Co-morbidities / Social History: ?Sickle cell disease ? ?Additional history: ?Additional history obtained from chart review. External records from outside source obtained and reviewed including prior ER visits at outside facilities. Reviewed PDMP record and consistent with patient history.  ? ?Physical Exam: ?Physical exam performed. The pertinent findings include: Patient is tearful. No increased joint swelling or warmth. Neurovascularly intact in all extremities. No chest pain, difficulty breathing. Clear lung sounds. Good oxygen saturation. Low suspicion for acute chest syndrome.  ? ?Lab Tests: ?I ordered, and personally interpreted labs.  The pertinent results include:  No leukocytosis. Hemoglobin and reticulocytes are improved compared to prior three weeks ago. Negative pregnancy. Electrolytes within normal limits. Low suspicion for acute sequestration or anaplastic anemia.  ?  ?Cardiac  Monitoring:  ?The patient was maintained on a cardiac monitor.  My attending physician Dr. Adela Lank viewed and interpreted the cardiac monitored which showed an underlying rhythm of: sinus rhythm. I agree with this interpretati

## 2021-05-09 NOTE — Discharge Instructions (Signed)
[facility]. I spoke to Stephanie Burch who is waiting for you. ? ?I hope you start feeling better soon! ?

## 2021-05-09 NOTE — ED Triage Notes (Signed)
Patient c/o sickle cell pain in her let arm and bilateral lower extremities x 2-3 days. ?

## 2021-05-09 NOTE — Progress Notes (Signed)
Reviewed PDMP substance reporting system prior to prescribing opiate medications. No inconsistencies noted.  Meds ordered this encounter  Medications   Oxycodone HCl 20 MG TABS    Sig: Take 1 tablet (20 mg total) by mouth every 4 (four) hours as needed for up to 15 days (pain).    Dispense:  90 tablet    Refill:  0    Order Specific Question:   Supervising Provider    Answer:   JEGEDE, OLUGBEMIGA E [[id number]]     Anilah Huck Moore Krystalyn Kubota  APRN, MSN, FNP-C [facility] [address]  [place],  [address] [phone]  

## 2021-05-09 NOTE — H&P (Signed)
?[facility] History and Physical  ? ?Date: 05/09/2021 ? ?Patient name: Stephanie Burch Medical record number: [id number] ?Date of birth: Sep 11, 1980 Age: 41 y.o. Gender: female ?PCP: Massie Maroon, FNP ? ?Attending physician: Quentin Angst, MD ? ?Chief Complaint: Sickle cell pain ? ?History of Present Illness: ?Stephanie Burch is a 41 year old female with a medical history significant for sickle cell disease, chronic pain syndrome, opiate dependence and and history of anemia of chronic disease presents with complaints of lower extremity pain that is consistent with his previous sickle cell pain crisis.  Patient was treated and evaluated in the emergency department this a.m. agree with EDP that patient is appropriate to transition to sickle cell day infusion clinic.  Patient states that pain intensity is 9/10 characterized as constant and throbbing.  She states that pain intensity has been elevated over the past several days and has been unrelieved by her home medications.  She has not identified any provocative factors concerning crisis.  Patient last had Xtampza and oxycodone this a.m. without sustained relief.  She denies any fever, chills, chest pain, or shortness of breath.  No urinary symptoms, nausea, vomiting, or diarrhea. ? ?Meds: ?Medications Prior to Admission  ?Medication Sig Dispense Refill Last Dose  ? crizanlizumab-tmca (ADAKVEO) 100 MG/10ML SOLN Inject 680 mg into the vein every 30 (thirty) days.     ? folic acid (FOLVITE) 1 MG tablet Take 1 tablet (1 mg total) by mouth daily. (Patient not taking: Reported on 04/11/2021) 30 tablet 11   ? gabapentin (NEURONTIN) 300 MG capsule Take 300 mg by mouth 3 (three) times daily. (Patient not taking: Reported on 02/12/2021)     ? HYDROmorphone (DILAUDID) 2 MG tablet Take 2 mg by mouth. (Patient not taking: Reported on 04/11/2021)     ? ibuprofen (ADVIL) 200 MG tablet Take 800-1,000 mg by mouth every 6 (six) hours as needed for fever, headache or  mild pain.     ? ibuprofen (ADVIL) 800 MG tablet Take by mouth. (Patient not taking: Reported on 04/11/2021)     ? ondansetron (ZOFRAN) 4 MG tablet TAKE 1 TABLET BY MOUTH EVERY 8 HOURS AS NEEDED FOR NAUSEA AND VOMITING (Patient taking differently: Take 4 mg by mouth every 8 (eight) hours as needed for vomiting or nausea.) 20 tablet 0   ? tiZANidine (ZANAFLEX) 4 MG capsule Take 4 mg by mouth 3 (three) times daily. (Patient not taking: Reported on 02/12/2021)     ? valACYclovir (VALTREX) 500 MG tablet Take 500 mg by mouth daily.     ? ? ?Allergies: ?Patient has no known allergies. ?Past Medical History:  ?Diagnosis Date  ? HSV infection   ? Nausea   ? Sickle cell anemia (HCC)   ? Vitamin B12 deficiency 12/2018  ? Vitamin D deficiency   ? ?No past surgical history on file. ?Family History  ?Problem Relation Age of Onset  ? Hypertension Mother   ? Sickle cell trait Mother   ? Diabetes Father   ? Sickle cell trait Father   ? ?Social History  ? ?Socioeconomic History  ? Marital status: Single  ?  Spouse name: Not on file  ? Number of children: Not on file  ? Years of education: Not on file  ? Highest education level: Not on file  ?Occupational History  ? Not on file  ?Tobacco Use  ? Smoking status: Never  ? Smokeless tobacco: Never  ?Vaping Use  ? Vaping Use: Never used  ?Substance and Sexual  Activity  ? Alcohol use: Not Currently  ? Drug use: Never  ? Sexual activity: Yes  ?  Birth control/protection: None  ?Other Topics Concern  ? Not on file  ?Social History Narrative  ? Not on file  ? ?Social Determinants of Health  ? ?Financial Resource Strain: Not on file  ?Food Insecurity: Not on file  ?Transportation Needs: Not on file  ?Physical Activity: Not on file  ?Stress: Not on file  ?Social Connections: Not on file  ?Intimate Partner Violence: Not on file  ? ?Review of Systems  ?Constitutional:  Negative for chills and fever.  ?HENT: Negative.    ?Respiratory: Negative.    ?Cardiovascular: Negative.   ?Gastrointestinal:  Negative.   ?Genitourinary: Negative.   ?Musculoskeletal:  Positive for back pain and joint pain.  ?Skin: Negative.   ?Neurological: Negative.   ?Psychiatric/Behavioral: Negative.    ? ?Physical Exam: ?Temperature 98.6 ?F (37 ?C), temperature source Oral, resp. rate 16, last menstrual period 05/06/2021, SpO2 100 %. ?Physical Exam ?Constitutional:   ?   Appearance: She is obese.  ?HENT:  ?   Nose: Nose normal.  ?Eyes:  ?   Pupils: Pupils are equal, round, and reactive to light.  ?Cardiovascular:  ?   Pulses: Normal pulses.  ?Pulmonary:  ?   Effort: Pulmonary effort is normal.  ?Abdominal:  ?   General: Bowel sounds are normal.  ?Musculoskeletal:     ?   General: Normal range of motion.  ?Skin: ?   General: Skin is warm.  ?Neurological:  ?   General: No focal deficit present.  ?   Mental Status: Mental status is at baseline.  ?Psychiatric:     ?   Mood and Affect: Mood normal.     ?   Behavior: Behavior normal.     ?   Thought Content: Thought content normal.     ?   Judgment: Judgment normal.  ?  ? ?Lab results: ?Results for orders placed or performed during the hospital encounter of 05/09/21 (from the past 24 hour(s))  ?Comprehensive metabolic panel     Status: Abnormal  ? Collection Time: 05/09/21  7:46 AM  ?Result Value Ref Range  ? Sodium 142 135 - 145 mmol/L  ? Potassium 3.9 3.5 - 5.1 mmol/L  ? Chloride 112 (H) 98 - 111 mmol/L  ? CO2 25 22 - 32 mmol/L  ? Glucose, Bld 129 (H) 70 - 99 mg/dL  ? BUN 7 6 - 20 mg/dL  ? Creatinine, Ser 0.57 0.44 - 1.00 mg/dL  ? Calcium 9.1 8.9 - 10.3 mg/dL  ? Total Protein 7.3 6.5 - 8.1 g/dL  ? Albumin 3.5 3.5 - 5.0 g/dL  ? AST 14 (L) 15 - 41 U/L  ? ALT 14 0 - 44 U/L  ? Alkaline Phosphatase 75 38 - 126 U/L  ? Total Bilirubin 0.7 0.3 - 1.2 mg/dL  ? GFR, Estimated >60 >60 mL/min  ? Anion gap 5 5 - 15  ?CBC with Differential     Status: Abnormal  ? Collection Time: 05/09/21  7:46 AM  ?Result Value Ref Range  ? WBC 9.1 4.0 - 10.5 K/uL  ? RBC 5.14 (H) 3.87 - 5.11 MIL/uL  ? Hemoglobin 11.9  (L) 12.0 - 15.0 g/dL  ? HCT 35.4 (L) 36.0 - 46.0 %  ? MCV 68.9 (L) 80.0 - 100.0 fL  ? MCH 23.2 (L) 26.0 - 34.0 pg  ? MCHC 33.6 30.0 - 36.0 g/dL  ? RDW 16.0 (  H) 11.5 - 15.5 %  ? Platelets 313 150 - 400 K/uL  ? nRBC 0.0 0.0 - 0.2 %  ? Neutrophils Relative % 65 %  ? Neutro Abs 5.9 1.7 - 7.7 K/uL  ? Lymphocytes Relative 25 %  ? Lymphs Abs 2.3 0.7 - 4.0 K/uL  ? Monocytes Relative 7 %  ? Monocytes Absolute 0.6 0.1 - 1.0 K/uL  ? Eosinophils Relative 3 %  ? Eosinophils Absolute 0.2 0.0 - 0.5 K/uL  ? Basophils Relative 0 %  ? Basophils Absolute 0.0 0.0 - 0.1 K/uL  ? Immature Granulocytes 0 %  ? Abs Immature Granulocytes 0.02 0.00 - 0.07 K/uL  ?Reticulocytes     Status: Abnormal  ? Collection Time: 05/09/21  7:46 AM  ?Result Value Ref Range  ? Retic Ct Pct 2.3 0.4 - 3.1 %  ? RBC. 5.04 3.87 - 5.11 MIL/uL  ? Retic Count, Absolute 116.9 19.0 - 186.0 K/uL  ? Immature Retic Fract 20.9 (H) 2.3 - 15.9 %  ?I-Stat beta hCG blood, ED     Status: None  ? Collection Time: 05/09/21  7:56 AM  ?Result Value Ref Range  ? I-stat hCG, quantitative <5.0 <5 mIU/mL  ? Comment 3          ? ? ?Imaging results:  ?No results found. ? ? ?Assessment & Plan: ?Patient admitted to sickle cell day infusion center for management of pain crisis.  ?Patient is opiate tolerant ?Initiate IV dilaudid PCA. IV fluids, 0.45% saline at 100 ml/hr ?Toradol 15 mg IV times one dose ?Tylenol 1000 mg by mouth times one dose ?If pain intensity remains elevated and/or sudden change in hemodynamic stability transition to inpatient services for higher level of care.  ? ?  ? ?Stephanie NationsLachina Moore Delshon Blanchfield  APRN, MSN, FNP-C ?Patient Care Center ?[facility] ?[address]  ?[place], [place] [address] ?[phone] ? ?05/09/2021, 1:50 PM ? ?

## 2021-05-12 ENCOUNTER — Encounter (HOSPITAL_COMMUNITY): Payer: Self-pay

## 2021-05-12 ENCOUNTER — Emergency Department (HOSPITAL_COMMUNITY)
Admission: EM | Admit: 2021-05-12 | Discharge: 2021-05-12 | Disposition: A | Discharge status: Home / Self Care | Payer: Medicare Other | Attending: Emergency Medicine | Admitting: Emergency Medicine

## 2021-05-12 DIAGNOSIS — D5700 Hb-SS disease with crisis, unspecified: Secondary | ICD-10-CM | POA: Diagnosis not present

## 2021-05-12 DIAGNOSIS — M79661 Pain in right lower leg: Secondary | ICD-10-CM | POA: Diagnosis present

## 2021-05-12 LAB — CBC WITH DIFFERENTIAL/PLATELET
Abs Immature Granulocytes: 0.03 10*3/uL (ref 0.00–0.07)
Basophils Absolute: 0 10*3/uL (ref 0.0–0.1)
Basophils Relative: 0 %
Eosinophils Absolute: 0.2 10*3/uL (ref 0.0–0.5)
Eosinophils Relative: 2 %
HCT: 37.5 % (ref 36.0–46.0)
Hemoglobin: 12.9 g/dL (ref 12.0–15.0)
Immature Granulocytes: 0 %
Lymphocytes Relative: 19 %
Lymphs Abs: 1.7 10*3/uL (ref 0.7–4.0)
MCH: 23.5 pg — ABNORMAL LOW (ref 26.0–34.0)
MCHC: 34.4 g/dL (ref 30.0–36.0)
MCV: 68.3 fL — ABNORMAL LOW (ref 80.0–100.0)
Monocytes Absolute: 0.6 10*3/uL (ref 0.1–1.0)
Monocytes Relative: 6 %
Neutro Abs: 6.5 10*3/uL (ref 1.7–7.7)
Neutrophils Relative %: 73 %
Platelets: 289 10*3/uL (ref 150–400)
RBC: 5.49 MIL/uL — ABNORMAL HIGH (ref 3.87–5.11)
RDW: 15.9 % — ABNORMAL HIGH (ref 11.5–15.5)
WBC: 9 10*3/uL (ref 4.0–10.5)
nRBC: 0 % (ref 0.0–0.2)

## 2021-05-12 LAB — COMPREHENSIVE METABOLIC PANEL
ALT: 13 U/L (ref 0–44)
AST: 18 U/L (ref 15–41)
Albumin: 3.9 g/dL (ref 3.5–5.0)
Alkaline Phosphatase: 80 U/L (ref 38–126)
Anion gap: 6 (ref 5–15)
BUN: 12 mg/dL (ref 6–20)
CO2: 25 mmol/L (ref 22–32)
Calcium: 9.1 mg/dL (ref 8.9–10.3)
Chloride: 106 mmol/L (ref 98–111)
Creatinine, Ser: 0.54 mg/dL (ref 0.44–1.00)
GFR, Estimated: >60 mL/min (ref >60)
Glucose, Bld: 115 mg/dL — ABNORMAL HIGH (ref 70–99)
Potassium: 4.3 mmol/L (ref 3.5–5.1)
Sodium: 137 mmol/L (ref 135–145)
Total Bilirubin: 0.6 mg/dL (ref 0.3–1.2)
Total Protein: 7.9 g/dL (ref 6.5–8.1)

## 2021-05-12 LAB — RETICULOCYTES
Immature Retic Fract: 24.2 % — ABNORMAL HIGH (ref 2.3–15.9)
RBC.: 5.39 MIL/uL — ABNORMAL HIGH (ref 3.87–5.11)
Retic Count, Absolute: 114.8 10*3/uL (ref 19.0–186.0)
Retic Ct Pct: 2.1 % (ref 0.4–3.1)

## 2021-05-12 LAB — I-STAT BETA HCG BLOOD, ED (MC, WL, AP ONLY): I-stat hCG, quantitative: <5 m[IU]/mL (ref <5)

## 2021-05-12 MED ORDER — HYDROMORPHONE HCL 2 MG/ML IJ SOLN
2.0000 mg | Freq: Once | INTRAMUSCULAR | Status: AC
  Administered 2021-05-12: 2 mg via INTRAVENOUS
  Filled 2021-05-12: qty 1

## 2021-05-12 MED ORDER — HYDROMORPHONE HCL 1 MG/ML IJ SOLN
1.0000 mg | Freq: Once | INTRAMUSCULAR | Status: AC
  Administered 2021-05-12: 1 mg via INTRAVENOUS
  Filled 2021-05-12: qty 1

## 2021-05-12 MED ORDER — KETOROLAC TROMETHAMINE 15 MG/ML IJ SOLN
15.0000 mg | Freq: Once | INTRAMUSCULAR | Status: AC
Start: 2021-05-12 — End: 2021-05-12
  Administered 2021-05-12: 15 mg via INTRAVENOUS
  Filled 2021-05-12: qty 1

## 2021-05-12 MED ORDER — DIPHENHYDRAMINE HCL 50 MG/ML IJ SOLN
12.5000 mg | Freq: Once | INTRAMUSCULAR | Status: AC
  Administered 2021-05-12: 12.5 mg via INTRAVENOUS
  Filled 2021-05-12: qty 1

## 2021-05-12 MED ORDER — OXYCODONE HCL 5 MG PO TABS
20.0000 mg | ORAL_TABLET | Freq: Once | ORAL | Status: AC
  Administered 2021-05-12: 20 mg via ORAL
  Filled 2021-05-12: qty 4

## 2021-05-12 NOTE — ED Notes (Signed)
ED Provider at bedside. 

## 2021-05-12 NOTE — ED Notes (Signed)
Pt c/o bilateral leg pain.  Reports home medications are not working.  Pt sts "I think I would be better if I would have stayed when I was seen on Thursday, but I hate hospitals." ?

## 2021-05-12 NOTE — ED Triage Notes (Signed)
Pt arrived via POV, c/o SCC, pain in bilateral leg. Was seen the other day for same, pain worsening once d/c.  ?

## 2021-05-12 NOTE — ED Notes (Signed)
Pt reports she thinks she needs to be admitted and placed on a PCA.  PA made aware.  ?

## 2021-05-12 NOTE — ED Provider Notes (Signed)
?[facility]-EMERGENCY DEPT ?Provider Note ? ? ?CSN: [id number] ?Arrival date & time: 05/12/21  [id number] ? ?  ? ?History ? ?Chief Complaint  ?Patient presents with  ? Sickle Cell Pain Crisis  ? ? ?Stephanie StarchKimaada Mcauley is a 41 y.o. female with history of sickle cell anemia who presents the emergency department complaining of sickle cell crisis with pain in bilateral legs.  I personally saw the patient on 4/6 for similar symptoms, she was admitted to the sickle cell day hospital in stable condition.  She was placed on a Dilaudid PCA for several hours, and upon getting home she states that the medication wore off.  She has been trying her at home prescribed opioids, and states that she is gone through most of them without any relief.  No fevers, chills, chest pain, shortness of breath. ? ? ?Sickle Cell Pain Crisis ?Associated symptoms: no chest pain, no fever and no shortness of breath   ? ?  ? ?Home Medications ?Prior to Admission medications   ?Medication Sig Start Date End Date Taking? Authorizing Provider  ?crizanlizumab-tmca (ADAKVEO) 100 MG/10ML SOLN Inject 680 mg into the vein every 30 (thirty) days. 07/20/20   [provider]  ?folic acid (FOLVITE) 1 MG tablet Take 1 tablet (1 mg total) by mouth daily. ?Patient not taking: Reported on 04/11/2021 11/15/18   Kallie LocksStroud, [name], FNP  ?gabapentin (NEURONTIN) 300 MG capsule Take 300 mg by mouth 3 (three) times daily. ?Patient not taking: Reported on 02/12/2021    [provider]  ?ibuprofen (ADVIL) 200 MG tablet Take 800-1,000 mg by mouth every 6 (six) hours as needed for fever, headache or mild pain.    [provider]  ?ibuprofen (ADVIL) 800 MG tablet Take by mouth.    [provider]  ?ondansetron (ZOFRAN) 4 MG tablet TAKE 1 TABLET BY MOUTH EVERY 8 HOURS AS NEEDED FOR NAUSEA AND VOMITING ?Patient taking differently: Take 4 mg by mouth every 8 (eight) hours as needed for vomiting or nausea. 12/24/20   Massie MaroonHollis, Lachina M, FNP   ?Oxycodone HCl 20 MG TABS Take 1 tablet (20 mg total) by mouth every 4 (four) hours as needed for up to 15 days (pain). 05/16/21 05/31/21  Massie MaroonHollis, Lachina M, FNP  ?tiZANidine (ZANAFLEX) 4 MG capsule Take 4 mg by mouth 3 (three) times daily. ?Patient not taking: Reported on 02/12/2021 10/07/20   [provider]  ?valACYclovir (VALTREX) 500 MG tablet Take 500 mg by mouth daily.    [provider]  ?   ? ?Allergies    ?Patient has no known allergies.   ? ?Review of Systems   ?Review of Systems  ?Constitutional:  Negative for fever.  ?Respiratory:  Negative for shortness of breath.   ?Cardiovascular:  Negative for chest pain.  ?Musculoskeletal:   ?     Bilateral leg pain  ?Neurological:  Negative for numbness.  ?All other systems reviewed and are negative. ? ?Physical Exam ?Updated Vital Signs ?BP 112/70   Pulse 83   Temp 98 ?F (36.7 ?C) (Oral)   Resp 18   LMP 05/06/2021   SpO2 100%  ?Physical Exam ?Vitals and nursing note reviewed.  ?Constitutional:   ?   Appearance: Normal appearance.  ?   Comments: Patient is tearful  ?HENT:  ?   Head: Normocephalic and atraumatic.  ?Eyes:  ?   Conjunctiva/sclera: Conjunctivae normal.  ?Cardiovascular:  ?   Rate and Rhythm: Normal rate and regular rhythm.  ?   Pulses:     ?  Posterior tibial pulses are 2+ on the right side and 2+ on the left side.  ?Pulmonary:  ?   Effort: Pulmonary effort is normal. No respiratory distress.  ?   Breath sounds: Normal breath sounds.  ?Abdominal:  ?   General: There is no distension.  ?   Palpations: Abdomen is soft.  ?   Tenderness: There is no abdominal tenderness.  ?Musculoskeletal:  ?   Right upper leg: Normal.  ?   Left upper leg: Normal.  ?   Right lower leg: Normal.  ?   Left lower leg: Normal.  ?Skin: ?   General: Skin is warm and dry.  ?Neurological:  ?   General: No focal deficit present.  ?   Mental Status: She is alert.  ? ? ?ED Results / Procedures / Treatments   ?Labs ?(all labs ordered are listed, but only abnormal  results are displayed) ?Labs Reviewed  ?CBC WITH DIFFERENTIAL/PLATELET - Abnormal; Notable for the following components:  ?    Result Value  ? RBC 5.49 (*)   ? MCV 68.3 (*)   ? MCH 23.5 (*)   ? RDW 15.9 (*)   ? All other components within normal limits  ?RETICULOCYTES - Abnormal; Notable for the following components:  ? RBC. 5.39 (*)   ? Immature Retic Fract 24.2 (*)   ? All other components within normal limits  ?COMPREHENSIVE METABOLIC PANEL - Abnormal; Notable for the following components:  ? Glucose, Bld 115 (*)   ? All other components within normal limits  ?I-STAT BETA HCG BLOOD, ED ([facility], [facility], [facility] ONLY)  ? ? ?EKG ?None ? ?Radiology ?No results found. ? ?Procedures ?Marland KitchenCritical Care ?Performed by: Su Monks, PA-C ?Authorized by: Su Monks, PA-C  ? ?Critical care provider statement:  ?  Critical care time (minutes):  30 ?  Critical care was necessary to treat or prevent imminent or life-threatening deterioration of the following conditions: sickle cell pain crisis. ?  Critical care was time spent personally by me on the following activities:  Development of treatment plan with patient or surrogate, discussions with consultants, evaluation of patient's response to treatment, examination of patient, ordering and review of laboratory studies, ordering and review of radiographic studies, ordering and performing treatments and interventions, pulse oximetry, re-evaluation of patient's condition and review of old charts  ? ? ?Medications Ordered in ED ?Medications  ?HYDROmorphone (DILAUDID) injection 1 mg (1 mg Intravenous Given 05/12/21 0841)  ?ketorolac (TORADOL) 15 MG/ML injection 15 mg (15 mg Intravenous Given 05/12/21 0842)  ?diphenhydrAMINE (BENADRYL) injection 12.5 mg (12.5 mg Intravenous Given 05/12/21 0841)  ?HYDROmorphone (DILAUDID) injection 2 mg (2 mg Intravenous Given 05/12/21 0940)  ?HYDROmorphone (DILAUDID) injection 2 mg (2 mg Intravenous Given 05/12/21 1104)  ?HYDROmorphone (DILAUDID) injection 1 mg  (1 mg Intravenous Given 05/12/21 1152)  ?oxyCODONE (Oxy IR/ROXICODONE) immediate release tablet 20 mg (20 mg Oral Given 05/12/21 1306)  ?HYDROmorphone (DILAUDID) injection 1 mg (1 mg Intravenous Given 05/12/21 1331)  ? ? ?ED Course/ Medical Decision Making/ A&P ?  ?                        ?Medical Decision Making ?Amount and/or Complexity of Data Reviewed ?Labs: ordered. ? ?Risk ?Prescription drug management. ? ? ?This patient is a 41 year old female who presents to the ED for concern of sickle cell crisis.  ? ?Differential diagnoses prior to evaluation: ?The emergent differential diagnosis includes, but is not limited  to,  sickle cell vasoocclusive crisis, acute chest syndrome, anaplastic anemia, sequestration syndrome.  ? ?Past Medical History / Co-morbidities: ?Sickle cell disease ? ?Additional history: ?Additional history obtained from chart review. External records from outside source obtained and reviewed including prior ER visits at outside facilities. Reviewed PDMP record and consistent with patient history.  ? ?Physical Exam: ?Physical exam performed. The pertinent findings include: Patient is tearful. No increased joint swelling or warmth. Neurovascularly intact in all extremities. No chest pain, difficulty breathing. Clear lung sounds. Good oxygen saturation. Low suspicion for acute chest syndrome.  ? ?Lab Tests/Imaging studies: ?I Ordered, and personally interpreted labs/imaging including CBC, BMP, reticulocyte, and pregnancy test.  The pertinent results include: No acute abnormalities.  ?  ?Medications: ?I ordered medication including analgesia for sickle cell pain.  I have reviewed the patients home medicines and have made adjustments as needed. ? ?Consultations obtained: ?I consulted sickle cell attending Dr. Hyman Hopes who recommended that patient be discharged home and follow-up in clinic.  ?  ?Disposition: ?After consideration of the diagnostic results and the patients response to treatment, I feel that  patient's not requiring admission or inpatient treatment for her sickle cell crisis.  Patient had reassuring lab work, and is maintained normal vital signs during her 6-hour observation in the department.  We w

## 2021-05-12 NOTE — Discharge Instructions (Addendum)
You were seen in the emergency department after a sickle cell crisis.  ? ?As we discussed, we are not able to change your prescription at this time. I recommend you follow up at the day hospital tomorrow.  ? ? ?

## 2021-05-13 ENCOUNTER — Emergency Department (HOSPITAL_COMMUNITY)
Admission: EM | Admit: 2021-05-13 | Discharge: 2021-05-13 | Disposition: A | Discharge status: Home / Self Care | Payer: Medicare Other | Attending: Emergency Medicine | Admitting: Emergency Medicine

## 2021-05-13 ENCOUNTER — Encounter (HOSPITAL_COMMUNITY): Payer: Medicare Other

## 2021-05-13 ENCOUNTER — Encounter (HOSPITAL_COMMUNITY): Payer: Self-pay

## 2021-05-13 DIAGNOSIS — M79602 Pain in left arm: Secondary | ICD-10-CM | POA: Diagnosis present

## 2021-05-13 DIAGNOSIS — G894 Chronic pain syndrome: Secondary | ICD-10-CM | POA: Diagnosis not present

## 2021-05-13 DIAGNOSIS — D57219 Sickle-cell/Hb-C disease with crisis, unspecified: Secondary | ICD-10-CM | POA: Diagnosis not present

## 2021-05-13 MED ORDER — HYDROMORPHONE HCL 2 MG/ML IJ SOLN
2.0000 mg | INTRAMUSCULAR | Status: AC | PRN
  Administered 2021-05-13 (×2): 2 mg via SUBCUTANEOUS
  Filled 2021-05-13 (×2): qty 1

## 2021-05-13 MED ORDER — OXYCODONE HCL 20 MG PO TABS: 20.0000 mg | ORAL_TABLET | ORAL | 0 refills | Status: AC

## 2021-05-13 NOTE — ED Provider Notes (Signed)
?[facility]-EMERGENCY DEPT ?Provider Note ? ? ?CSN: [id number] ?Arrival date & time: 05/13/21  [id number] ? ?  ? ?History ? ?Chief Complaint  ?Patient presents with  ? Sickle Cell Pain Crisis  ? ? ?Stephanie Burch is a 41 y.o. female. ? ?HPI ?She is presenting for evaluation of sickle cell crisis pain, noted in her left arm and both legs.  This is the typical site for her pain.  Recently she has been using extra narcotic pain medicine because of the degree of her pain.  Yesterday she was evaluated and treated in the emergency department at [facility].  She was given multiple doses of narcotic pain reliever and discharged.  The patient states she called the sickle cell clinic today because she needed some more help, and was told that they could not see her because of resources.  She therefore came here.  She states she is  out of her prescribed narcotic, oxycodone 20 mg which she takes every 4 hours.  Last prescription was on 05/02/2021, for #90.  This was supposed to last for 15 days.  She has a very high MME, greater than 200.  She has been "doubling up on my pain medicine," and that is why she is "out of it." ?  ? ?Home Medications ?Prior to Admission medications   ?Medication Sig Start Date End Date Taking? Authorizing Provider  ?Oxycodone HCl 20 MG TABS Take 1 tablet (20 mg total) by mouth every 4 (four) hours for 3 days. 05/13/21 05/16/21 Yes Mancel BaleWentz, Jakiah Goree, MD  ?crizanlizumab-tmca (ADAKVEO) 100 MG/10ML SOLN Inject 680 mg into the vein every 30 (thirty) days. 07/20/20   [provider]  ?folic acid (FOLVITE) 1 MG tablet Take 1 tablet (1 mg total) by mouth daily. ?Patient not taking: Reported on 04/11/2021 11/15/18   Kallie LocksStroud, [name], FNP  ?gabapentin (NEURONTIN) 300 MG capsule Take 300 mg by mouth 3 (three) times daily. ?Patient not taking: Reported on 02/12/2021    [provider]  ?ibuprofen (ADVIL) 200 MG tablet Take 800-1,000 mg by mouth every 6 (six) hours as needed for  fever, headache or mild pain.    [provider]  ?ibuprofen (ADVIL) 800 MG tablet Take by mouth.    [provider]  ?ondansetron (ZOFRAN) 4 MG tablet TAKE 1 TABLET BY MOUTH EVERY 8 HOURS AS NEEDED FOR NAUSEA AND VOMITING ?Patient taking differently: Take 4 mg by mouth every 8 (eight) hours as needed for vomiting or nausea. 12/24/20   Massie MaroonHollis, Lachina M, FNP  ?Oxycodone HCl 20 MG TABS Take 1 tablet (20 mg total) by mouth every 4 (four) hours as needed for up to 15 days (pain). 05/16/21 05/31/21  Massie MaroonHollis, Lachina M, FNP  ?tiZANidine (ZANAFLEX) 4 MG capsule Take 4 mg by mouth 3 (three) times daily. ?Patient not taking: Reported on 02/12/2021 10/07/20   [provider]  ?valACYclovir (VALTREX) 500 MG tablet Take 500 mg by mouth daily.    [provider]  ?   ? ?Allergies    ?Patient has no known allergies.   ? ?Review of Systems   ?Review of Systems ? ?Physical Exam ?Updated Vital Signs ?BP 118/88   Pulse (!) 111   Temp 98.3 ?F (36.8 ?C)   Resp 18   Ht 5\' 5"  (1.651 m)   Wt 136.1 kg   LMP 05/06/2021   SpO2 100%   BMI 49.92 kg/m?  ?Physical Exam ?Vitals and nursing note reviewed.  ?Constitutional:   ?  General: She is in acute distress (She is uncomfortable).  ?   Appearance: She is well-developed. She is obese. She is not ill-appearing or diaphoretic.  ?HENT:  ?   Head: Normocephalic and atraumatic.  ?   Right Ear: External ear normal.  ?   Left Ear: External ear normal.  ?Eyes:  ?   Conjunctiva/sclera: Conjunctivae normal.  ?   Pupils: Pupils are equal, round, and reactive to light.  ?Neck:  ?   Trachea: Phonation normal.  ?Cardiovascular:  ?   Rate and Rhythm: Normal rate.  ?Pulmonary:  ?   Effort: Pulmonary effort is normal.  ?Abdominal:  ?   General: There is no distension.  ?   Palpations: Abdomen is soft.  ?Musculoskeletal:     ?   General: Normal range of motion.  ?   Cervical back: Normal range of motion and neck supple.  ?Skin: ?   General: Skin is warm and dry.   ?Neurological:  ?   Mental Status: She is alert and oriented to person, place, and time.  ?   Cranial Nerves: No cranial nerve deficit.  ?   Sensory: No sensory deficit.  ?   Motor: No abnormal muscle tone.  ?   Coordination: Coordination normal.  ?Psychiatric:     ?   Mood and Affect: Mood normal.     ?   Behavior: Behavior normal.     ?   Thought Content: Thought content normal.     ?   Judgment: Judgment normal.  ? ? ?ED Results / Procedures / Treatments   ?Labs ?(all labs ordered are listed, but only abnormal results are displayed) ?Labs Reviewed  ?COMPREHENSIVE METABOLIC PANEL  ?CBC WITH DIFFERENTIAL/PLATELET  ?RETICULOCYTES  ? ? ?EKG ?None ? ?Radiology ?No results found. ? ?Procedures ?Procedures  ? ? ?Medications Ordered in ED ?Medications  ?HYDROmorphone (DILAUDID) injection 2 mg (2 mg Subcutaneous Given 05/13/21 1051)  ? ? ?ED Course/ Medical Decision Making/ A&P ?Clinical Course as of 05/13/21 1732  ?Mon May 13, 2021  ?1218 I instructed the patient that it was time to go.  She did not know what she would do without her narcotics which were not refillable for a few more days.  I offered her a prescription to bridge her, telling her that this may violate her contract and preventing her from getting any additional narcotics from her prescriber in the future.  She stated she wants to go that route and deal with the consequences. [EW]  ?  ?Clinical Course User Index ?[EW] Mancel Bale, MD  ? ?                        ?Medical Decision Making ?Patient presents for evaluation of what she considers a sickle cell crisis.  She has had multiple recent similar evaluations, with dispositions and discharge, including to the sickle cell day hospital.Her hemoglobin has been stable and she has not required transfusion recently.  She has chronic pain and takes oxycodone 20 mg, 6 times a day.  She states that her pain has been so severe recently that she has been doubling up on the medicine therefore has run out before her  next prescription can be filled in a few days. ? ?Amount and/or Complexity of Data Reviewed ?Independent Historian:  ?   Details: She is a cogent historian ?External Data Reviewed: labs and notes. ?   Details: Documentation for recent evaluations in the sickle cell day  hospital in [place], and other facilities in [place]. ? ?Risk ?Prescription drug management. ?Risk Details: Patient was treated for suspected narcotic withdrawal with subcu Dilaudid x2.  This seemed to improve her comfort, but she still felt like she was in crisis.  I explained to her that I believe that she had chronic pain, not currently being adequately treated because she ran out of her chronic pain medicine.  I explained I could help her with a prescription for narcotics, but that it would violate her contract and she may not be able to get additional medicine from her chronic pain medication provider.  The patient has behaviors that would be consistent with diversion of narcotics, which is illegal.  She does not appear to be in crisis which would require additional evaluations and treatments in the ED and/or hospitalization.  She is stable for discharge.  I wrote her a prescription for 18, 20 mg oxycodone tablets, to help her bridge the gap for 3 days, until she can refill her new prescription.  She is discharged in stable condition ? ? ? ? ? ? ? ? ? ?Final Clinical Impression(s) / ED Diagnoses ?Final diagnoses:  ?Chronic pain syndrome  ? ? ?Rx / DC Orders ?ED Discharge Orders   ? ?      Ordered  ?  Oxycodone HCl 20 MG TABS  Every 4 hours       ? 05/13/21 1224  ? ?  ?  ? ?  ? ? ?  ?Mancel Bale, MD ?05/13/21 1740 ? ?

## 2021-05-13 NOTE — ED Triage Notes (Signed)
Pt presents with c/o sickle cell pain. Pt reports she was seen yesterday for same and told to follow up at the day clinic but reports that the day clinic is closed. Pt reports pain is in her left arm and both legs. ?

## 2021-05-13 NOTE — H&P (Signed)
?  Patient: Stephanie Burch  PID: [id number]  DOB: January 13, 1981  SEX: Female   Patient referred by DDS for extraction tooth #17.  CC: Pain lower left tooth.  Past Medical History:  Kidney Failure, Lung trouble, sickle cell disease, Anemia, Morbid Obesity    Medications: Oxycodone, Xtampza    Allergies:     NKDA    Surgeries:   C Section     Social History       Smoking: n           Alcohol:n Drug use:n                             Exam: BMI 52. Tooth fragment #1 visible, Decay #17. Erupted #32.   No purulence, edema, fluctuance, trismus. Oral cancer screening negative. Pharynx clear. No lymphadenopathy.  Panorex: 17 large decay, 1 residual root, decay #32.   Assessment: ASA 3. Non-restorable 1, 17, 32.              Plan: Extraction Teeth # 1, 17, 32.   Hospital Day surgery.                 Rx: n               Risks and complications explained. Questions answered.   Cecillia Menees M. Eliezer Khawaja, DMD  

## 2021-05-13 NOTE — Discharge Instructions (Signed)
It appears that you are withdrawing from narcotics since you have used extra oxycodone and run out.  We are giving you a short-term prescription that was sent to your pharmacy.  Your pharmacist might choose not to fill it based on your history that they are aware of.  This also might violate the terms of your chronic pain contract, meaning that your current prescriber may not give you any more refills on oxycodone.  You have chosen to receive this prescription, because you continue to have pain that you cannot control without narcotic pain medicine. ?

## 2021-05-15 ENCOUNTER — Other Ambulatory Visit: Payer: Self-pay

## 2021-05-15 ENCOUNTER — Encounter (HOSPITAL_COMMUNITY): Payer: Self-pay | Admitting: Oral Surgery

## 2021-05-15 NOTE — Progress Notes (Signed)
PCP - Julianne Handler, NP ?EKG - 05/09/21  ?Chest x-ray - 04/02/21 ?ECHO - 11/15/19 ? ?Anesthesia review: n/a ? ?------------- ? ?SDW INSTRUCTIONS: ? ?Your procedure is scheduled on Friday, April 14th. Please report to [facility] Main Entrance "A" at 6:20 A.M., and check in at the Admitting office. Call this number if you have problems the morning of surgery: [phone] ? ? ?Remember: Do not eat or drink after midnight the night before your surgery ? ?Medications to take morning of surgery with a sip of water include: ?Zofran, Narcan - if needed ? ?As of today, STOP taking any Aspirin (unless otherwise instructed by your surgeon), Aleve, Naproxen, Ibuprofen, Motrin, Advil, Goody's, BC's, all herbal medications, fish oil, and all vitamins. ? ?  ?The Morning of Surgery ?Do not wear jewelry, make-up or nail polish. ?Do not wear lotions, powders, or perfumes/colognes, or deodorant ?Do not bring valuables to the hospital. ?[facility] is not responsible for any belongings or valuables. ? ?If you are a smoker, DO NOT Smoke 24 hours prior to surgery ? ?If you wear a CPAP at night please bring your mask the morning of surgery  ? ?Remember that you must have someone to transport you home after your surgery, and remain with you for 24 hours if you are discharged the same day. ? ?Please bring cases for contacts, glasses, hearing aids, dentures or bridgework because it cannot be worn into surgery.  ? ?Patients discharged the day of surgery will not be allowed to drive home.  ? ?Please shower the NIGHT BEFORE/MORNING OF SURGERY (use antibacterial soap like DIAL soap if possible). Wear comfortable clothes the morning of surgery. Oral Hygiene is also important to reduce your risk of infection.  Remember - BRUSH YOUR TEETH THE MORNING OF SURGERY WITH YOUR REGULAR TOOTHPASTE ? ?Patient denies shortness of breath, fever, cough and chest pain.  ? ? ?   ? ?

## 2021-05-16 ENCOUNTER — Encounter (HOSPITAL_COMMUNITY): Payer: Self-pay | Admitting: Anesthesiology

## 2021-05-17 ENCOUNTER — Ambulatory Visit (HOSPITAL_COMMUNITY): Admission: RE | Admit: 2021-05-17 | Payer: Medicare Other | Source: Home / Self Care | Admitting: Oral Surgery

## 2021-05-17 HISTORY — DX: Depression, unspecified: F32.A

## 2021-05-17 HISTORY — DX: Pneumonia, unspecified organism: J18.9

## 2021-05-17 SURGERY — DENTAL RESTORATION/EXTRACTIONS
Anesthesia: General

## 2021-05-21 ENCOUNTER — Telehealth: Payer: Self-pay | Admitting: Clinical

## 2021-05-21 ENCOUNTER — Telehealth: Payer: Self-pay

## 2021-05-21 NOTE — Telephone Encounter (Signed)
Xtampza ° °

## 2021-05-21 NOTE — Telephone Encounter (Signed)
[facility] ?General Follow Up Note ? ?05/21/2021 ?Name: Alvis Antonini MRN: [id number] DOB: May 14, 1980 ?Ladell Houtman is a 41 y.o. year old female who sees [place], Asencion Partridge, FNP for primary care. LCSW was initially consulted to support with stress precipitating sickle cell crisis and illness anxiety. ? ?**This was an unsuccessful encounter.** ? ?Interpreter: No.   Interpreter Name & Language: none ? ?Assessment: Patient experiencing anxiety related to chronic illness.  ? ?Ongoing Intervention: CSW called patient to schedule follow up visit. Called and left voice mails 4/13, 4/17, and today 05/21/21. Patient did not answer, awaiting return call.  ? ?SDOH (Social Determinants of Health) assessments performed: No ? ?Review of patient status, including review of consultants reports, relevant laboratory and other test results, and collaboration with appropriate care team members and the patient's provider was performed as part of comprehensive patient evaluation and provision of services.   ? ?Estanislado Emms, LCSW ?[facility] ?[place] ?[phone] ?  ? ?

## 2021-05-22 ENCOUNTER — Other Ambulatory Visit: Payer: Self-pay | Admitting: Family Medicine

## 2021-05-22 DIAGNOSIS — G894 Chronic pain syndrome: Secondary | ICD-10-CM

## 2021-05-22 MED ORDER — XTAMPZA ER 18 MG PO C12A: 18.0000 mg | EXTENDED_RELEASE_CAPSULE | Freq: Two times a day (BID) | ORAL | 0 refills | Status: DC

## 2021-05-22 NOTE — Progress Notes (Signed)
Reviewed PDMP substance reporting system prior to prescribing opiate medications. No inconsistencies noted.  °Meds ordered this encounter  °Medications  ° oxyCODONE ER (XTAMPZA ER) 18 MG C12A  °  Sig: Take 18 mg by mouth every 12 (twelve) hours.  °  Dispense:  60 capsule  °  Refill:  0  °  Order Specific Question:   Supervising Provider  °  Answer:   JEGEDE, OLUGBEMIGA E [[id number]]  ° Keri Veale Moore Guynell Kleiber  APRN, MSN, FNP-C °[facility] °[facility] °[address]  °[place], [place] [address] °[phone] ° °

## 2021-05-24 ENCOUNTER — Telehealth: Payer: Self-pay

## 2021-05-24 NOTE — Telephone Encounter (Signed)
Patient prior authorization has been started today for [facility] ER 18mg . ? ? ?

## 2021-05-27 ENCOUNTER — Other Ambulatory Visit: Payer: Self-pay | Admitting: Family Medicine

## 2021-05-27 ENCOUNTER — Other Ambulatory Visit: Payer: Self-pay

## 2021-05-27 DIAGNOSIS — F1190 Opioid use, unspecified, uncomplicated: Secondary | ICD-10-CM

## 2021-05-27 DIAGNOSIS — G894 Chronic pain syndrome: Secondary | ICD-10-CM

## 2021-05-27 DIAGNOSIS — D571 Sickle-cell disease without crisis: Secondary | ICD-10-CM

## 2021-05-27 MED ORDER — OXYCODONE HCL 20 MG PO TABS: 20.0000 mg | ORAL_TABLET | ORAL | 0 refills | Status: AC | PRN

## 2021-05-27 NOTE — Progress Notes (Signed)
Reviewed PDMP substance reporting system prior to prescribing opiate medications. No inconsistencies noted.  Meds ordered this encounter  Medications   Oxycodone HCl 20 MG TABS    Sig: Take 1 tablet (20 mg total) by mouth every 4 (four) hours as needed for up to 15 days (pain).    Dispense:  90 tablet    Refill:  0    Order Specific Question:   Supervising Provider    Answer:   JEGEDE, OLUGBEMIGA E [[id number]]     Macklin Jacquin Moore Kristapher [place]  APRN, MSN, FNP-C [facility] [address]  , [place] [address] [phone]  

## 2021-05-28 ENCOUNTER — Telehealth: Payer: Self-pay

## 2021-05-28 ENCOUNTER — Encounter (HOSPITAL_COMMUNITY): Payer: Medicare Other

## 2021-05-28 NOTE — Telephone Encounter (Signed)
Oxycodone  °

## 2021-05-29 ENCOUNTER — Other Ambulatory Visit: Payer: Self-pay | Admitting: Family Medicine

## 2021-05-29 DIAGNOSIS — D571 Sickle-cell disease without crisis: Secondary | ICD-10-CM

## 2021-05-29 DIAGNOSIS — F1190 Opioid use, unspecified, uncomplicated: Secondary | ICD-10-CM

## 2021-05-29 DIAGNOSIS — G894 Chronic pain syndrome: Secondary | ICD-10-CM

## 2021-05-30 ENCOUNTER — Telehealth: Payer: Self-pay

## 2021-05-30 NOTE — Telephone Encounter (Signed)
Patient PA was started on 05/27/22 for Xtampza. ? ?PA was denied on 05/27/21 and patient was advised to contact her insurance company to she what alternative medication they would cover. ?

## 2021-06-25 ENCOUNTER — Ambulatory Visit: Payer: Self-pay | Admitting: Family Medicine

## 2021-08-12 NOTE — Telephone Encounter (Signed)
error 

## 2021-08-26 NOTE — H&P (Signed)
HISTORY AND PHYSICAL  Stephanie Burch is a 41 y.o. female patient referred by general dentist for wisdom teeth rermoval  No diagnosis found.  Past Medical History:  Diagnosis Date   Depression    HSV infection    Nausea    Pneumonia    Sickle cell anemia (HCC)    Vitamin B12 deficiency 12/2018   Vitamin D deficiency     No current facility-administered medications for this encounter.   Current Outpatient Medications  Medication Sig Dispense Refill   ASHWAGANDHA PO Take 600 mg by mouth daily. Thiamine 200 mg     crizanlizumab-tmca (ADAKVEO) 100 MG/10ML SOLN Inject 680 mg into the vein every 30 (thirty) days.     ibuprofen (ADVIL) 800 MG tablet Take 800 mg by mouth every 6 (six) hours as needed for fever, headache or mild pain.     naloxone (NARCAN) nasal spray 4 mg/0.1 mL 0.4 mg once.     ondansetron (ZOFRAN) 4 MG tablet TAKE 1 TABLET BY MOUTH EVERY 8 HOURS AS NEEDED FOR NAUSEA AND VOMITING (Patient taking differently: Take 4 mg by mouth every 8 (eight) hours as needed for vomiting or nausea.) 20 tablet 0   oxyCODONE ER (XTAMPZA ER) 18 MG C12A Take 18 mg by mouth every 12 (twelve) hours. 60 capsule 0   valACYclovir (VALTREX) 500 MG tablet Take 500 mg by mouth daily.     ZINC-VITAMIN C PO Take 1 tablet by mouth daily. Zinc 15 mg Vitamin C 60 mg     No Known Allergies Active Problems:   * No active hospital problems. *  Vitals: There were no vitals taken for this visit. Lab results:No results found for this or any previous visit (from the past 24 hour(s)). Radiology Results: No results found. General appearance: alert, cooperative, no distress, and morbidly obese Head: Normocephalic, without obvious abnormality, atraumatic Eyes: negative Nose: Nares normal. Septum midline. Mucosa normal. No drainage or sinus tenderness. Throat: Tooth fragment #1 visible. Decay #17, erupted #32. No purulence, edema, fluctuance. Pharynx clear. Neck: no adenopathy  Assessment: 41 yo female Sickle  Cell Anemia, Morbid Obesity, non-restorable teeth 1, 17, 32.  Plan: Extraction 1, 17, 32, GA. Day surgery.   Ocie Doyne 08/26/2021

## 2021-08-29 ENCOUNTER — Encounter (HOSPITAL_COMMUNITY): Payer: Self-pay | Admitting: Physician Assistant

## 2021-08-30 ENCOUNTER — Ambulatory Visit (HOSPITAL_COMMUNITY): Admission: RE | Admit: 2021-08-30 | Payer: Medicare Other | Source: Home / Self Care | Admitting: Oral Surgery

## 2021-08-30 SURGERY — DENTAL RESTORATION/EXTRACTIONS
Anesthesia: General

## 2021-09-03 DIAGNOSIS — D571 Sickle-cell disease without crisis: Secondary | ICD-10-CM

## 2021-09-03 HISTORY — DX: Sickle-cell disease without crisis: D57.1

## 2021-09-12 ENCOUNTER — Telehealth: Payer: Self-pay | Admitting: Family Medicine

## 2021-09-12 NOTE — Telephone Encounter (Signed)
Pt called wondering if she has to be seen before a new prescription could be written for oxycodone and xtampza   Pt is out of town in hospital so phone number on chart will not be able to reach her  Can be reached at [phone]

## 2021-10-06 IMAGING — DX DG CHEST 1V PORT
1 series · 1 of 1 positions shown · non-contrast
Comparison: 05/18/2020

CLINICAL DATA: Bilateral leg pain for the past 2 days. Sickle cell
disease.

EXAM:
PORTABLE CHEST 1 VIEW

[chest ap]
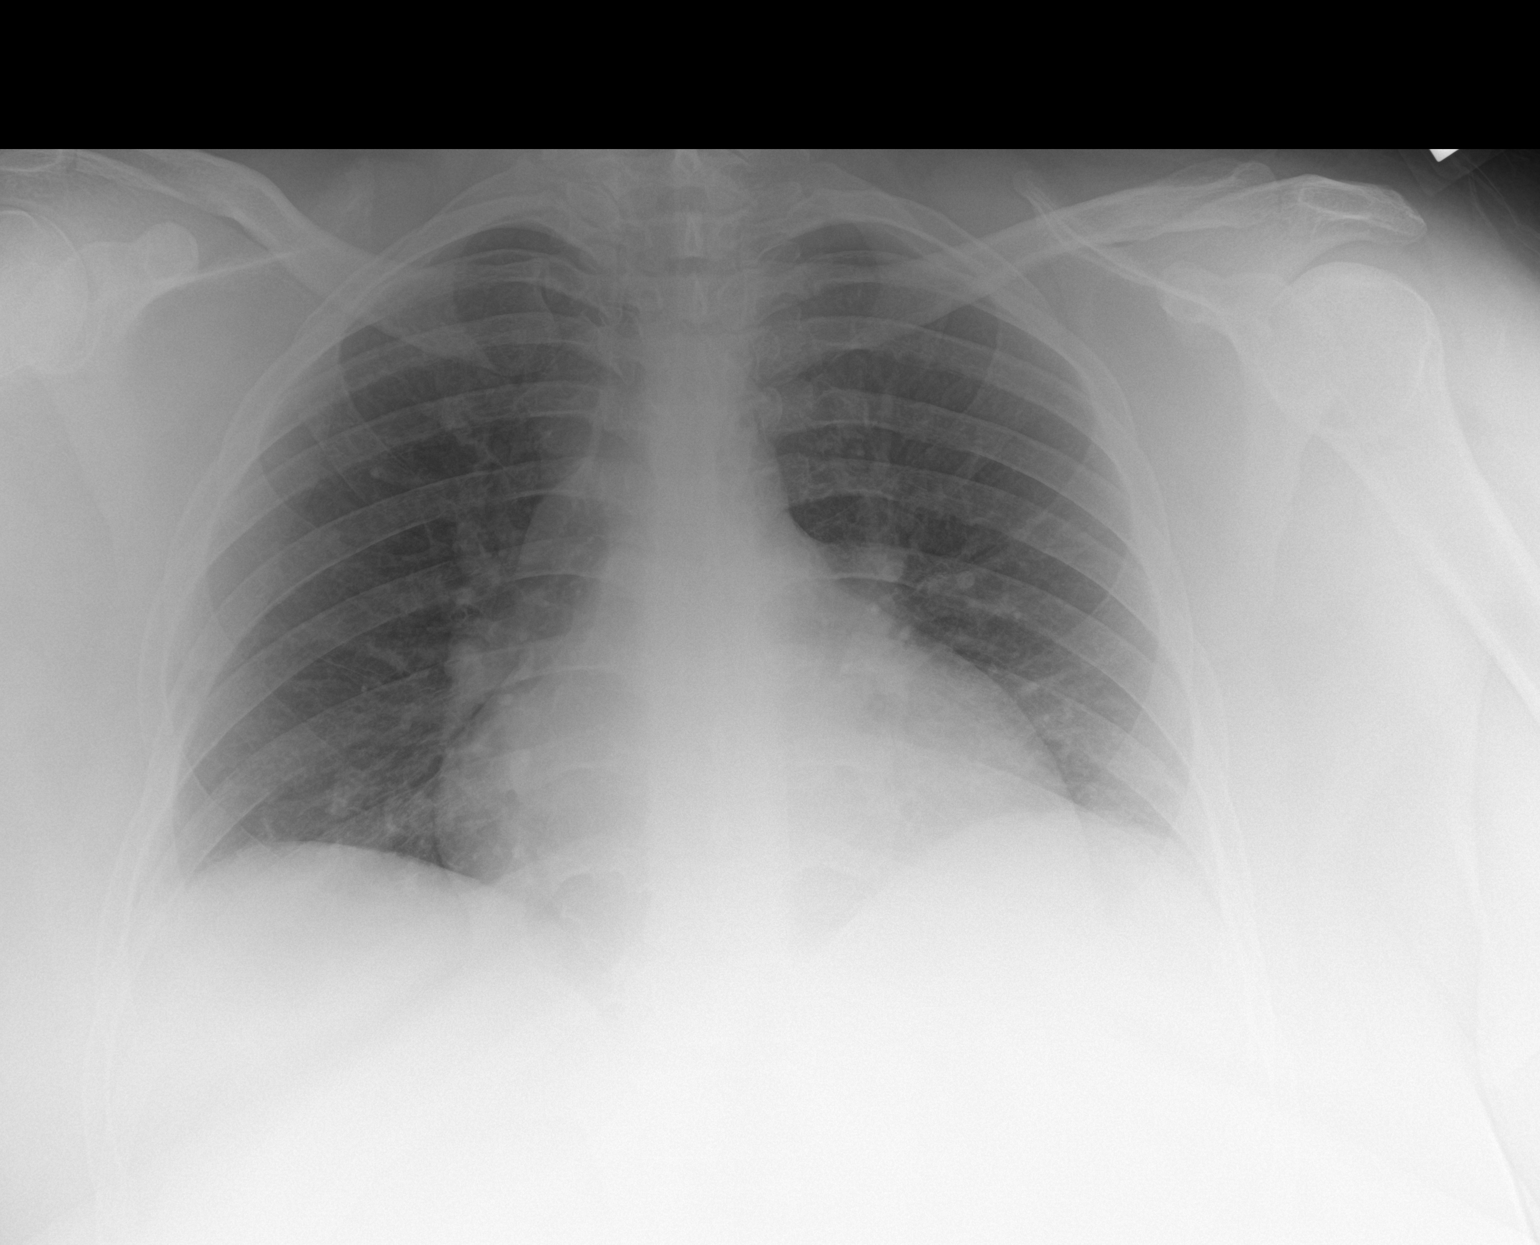

[1 of 1 positions shown; findings below may reference images not displayed]

FINDINGS: Stable enlarged cardiac silhouette. Clear lungs with normal
vascularity. Unremarkable bones.
IMPRESSION: No acute abnormality.  Stable mild cardiomegaly

## 2021-10-14 NOTE — H&P (Signed)
  Patient: Stephanie Burch  PID: [id number]  DOB: 16-Apr-1980  SEX: Female   Patient referred by DDS for extraction tooth #17.  CC: Pain lower left tooth.  Past Medical History:  Kidney Failure, Lung trouble, sickle cell disease, Anemia, Morbid Obesity    Medications: Oxycodone, Xtampza    Allergies:     NKDA    Surgeries:   C Section     Social History       Smoking: n           Alcohol:n Drug use:n                             Exam: BMI 52. Tooth fragment #1 visible, Decay #17. Erupted #32.   No purulence, edema, fluctuance, trismus. Oral cancer screening negative. Pharynx clear. No lymphadenopathy.  Panorex: 17 large decay, 1 residual root, decay #32.   Assessment: ASA 3. Non-restorable 1, 17, 32.              Plan: Extraction Teeth # 1, 17, 32.   Hospital Day surgery.                 Rx: n               Risks and complications explained. Questions answered.   Georgia Lopes, DMD

## 2021-10-17 ENCOUNTER — Encounter (HOSPITAL_COMMUNITY): Payer: Self-pay | Admitting: Oral Surgery

## 2021-10-17 NOTE — Progress Notes (Signed)
PCP - Dr Wylie Hail  Cardiologist - n/a Hematology & Oncology - Dr Donia Guiles  Chest x-ray - 04/02/21 EKG - 05/09/21 Epic, 08/16/21 CE Stress Test - n/a ECHO - 06/20/21 CE Cardiac Cath - n/a  ICD Pacemaker/Loop - n/a  Sleep Study -  n/a CPAP - none  STOP now taking any Aspirin (unless otherwise instructed by your surgeon), Aleve, Naproxen, Ibuprofen, Motrin, Advil, Goody's, BC's, all herbal medications, fish oil, and all vitamins.   Coronavirus Screening Do you have any of the following symptoms:  Cough yes/no: No Fever (>100.78F)  yes/no: No Runny nose yes/no: No Sore throat yes/no: No Difficulty breathing/shortness of breath  yes/no: No  Have you traveled in the last 14 days and where? yes/no: No  Patient verbalized understanding of instructions that were given via phone.

## 2021-10-17 NOTE — Anesthesia Preprocedure Evaluation (Addendum)
Anesthesia Evaluation  Patient identified by MRN, date of birth, ID band Patient awake    Reviewed: Allergy & Precautions, NPO status , Patient's Chart, lab work & pertinent test results  History of Anesthesia Complications Negative for: history of anesthetic complications  Airway Mallampati: III  TM Distance: >3 FB Neck ROM: Full    Dental  (+) Teeth Intact, Dental Advisory Given   Pulmonary neg pulmonary ROS,    Pulmonary exam normal        Cardiovascular negative cardio ROS Normal cardiovascular exam     Neuro/Psych negative neurological ROS     GI/Hepatic negative GI ROS, Neg liver ROS,   Endo/Other  Morbid obesity  Renal/GU negative Renal ROS  negative genitourinary   Musculoskeletal  (+) narcotic dependent  Abdominal   Peds  Hematology  (+) Blood dyscrasia, Sickle cell anemia ,   Anesthesia Other Findings   Reproductive/Obstetrics                            Anesthesia Physical Anesthesia Plan  ASA: 3  Anesthesia Plan: General   Post-op Pain Management: Tylenol PO (pre-op)*, Toradol IV (intra-op)* and Ketamine IV*   Induction: Intravenous  PONV Risk Score and Plan: 3 and Ondansetron, Dexamethasone, Treatment may vary due to age or medical condition and Midazolam  Airway Management Planned: Nasal ETT  Additional Equipment: None  Intra-op Plan:   Post-operative Plan: Extubation in OR  Informed Consent: I have reviewed the patients History and Physical, chart, labs and discussed the procedure including the risks, benefits and alternatives for the proposed anesthesia with the patient or authorized representative who has indicated his/her understanding and acceptance.     Dental advisory given  Plan Discussed with:   Anesthesia Plan Comments: (PAT note by Antionette Poles, PA-C: Follows with hematology at [facility] for history of sickle cell type [place].  Her pain has been very  difficult to control even with high doses of opiate medications.  Current regimen is oxycodone 30 mg q4hr and Dilaudid 4 mg q6hr.  Last seen 09/30/2021.  Per note, "A very lengthy discussion with Ms Mccalla today regarding pain management options, risks, concerns. She is not getting effective analgesia at very high doses of opoid medications, which is causing her to seek care at varied institutions as an attempt to try to get this under better control. Explored many options in the end we settled on trial of alternating Dialudid v Oxycodone for her SA control as this seemed to be effective following one of her recent hospitalizations. I am a bit disappointed in that in parallel to this she stopped taking the oxbryta and HU. She reports she ran out and did not seek a refill. I think these disease-modifying agents pose the best chance of her getting the SCD under fundamental control, and stopping these agents, in particular the HU is counterproductive to her goal of pain relief. We discussed Adakveo. Pt would like to restart. She had been on this before at her prior hematologist office, but had stopped to perceived lack of benefit. It seems now some months since she stopped, in retrospect she feels the absence of this. We will go ahead and make efforts and arrange to resume these infusions starting in the next week."  Upcoming surgery was also discussed and she was scheduled for labs to see if she required preop transfusion for hemoglobin optimization.  Hemoglobin 10/16/2021 was 12.1, no transfusion indicated.  Per PAT RN, patient  stated on preop phone call that she feels her pain has been better controlled since restarting [place].  Patient will need day of surgery evaluation.  EKG 08/16/2021 (Care Everywhere): Sinus tachycardia.  Rate 123.  Nonspecific ST-T changes.  TTE 06/20/21 (Care Everywhere) SUMMARY  The left ventricular size is normal.  Left ventricular systolic function is normal.  LV ejection fraction  = 60-65%.  The right ventricle is normal in size and function.  The left atrium is mildly dilated.  There was insufficient TR detected to calculate RV systolic pressure.  IVC size was normal.  There is no significant valvular stenosis or regurgitation.  There is no pericardial effusion.  There is no comparison study available  )      Anesthesia Quick Evaluation

## 2021-10-17 NOTE — Progress Notes (Signed)
Anesthesia Chart Review: Same-day work-up  Follows with hematology at [facility] for history of sickle cell type [place].  Her pain has been very difficult to control even with high doses of opiate medications.  Current regimen is oxycodone 30 mg q4hr and Dilaudid 4 mg q6hr.  Last seen 09/30/2021.  Per note, "A very lengthy discussion with Stephanie Burch today regarding pain management options, risks, concerns. She is not getting effective analgesia at very high doses of opoid medications, which is causing her to seek care at varied institutions as an attempt to try to get this under better control. Explored many options in the end we settled on trial of alternating Dialudid v Oxycodone for her SA control as this seemed to be effective following one of her recent hospitalizations. I am a bit disappointed in that in parallel to this she stopped taking the oxbryta and HU. She reports she ran out and did not seek a refill. I think these disease-modifying agents pose the best chance of her getting the SCD under fundamental control, and stopping these agents, in particular the HU is counterproductive to her goal of pain relief. We discussed [place]. Pt would like to restart. She had been on this before at her prior hematologist office, but had stopped to perceived lack of benefit. It seems now some months since she stopped, in retrospect she feels the absence of this. We will go ahead and make efforts and arrange to resume these infusions starting in the next week."  Upcoming surgery was also discussed and she was scheduled for labs to see if she required preop transfusion for hemoglobin optimization.  Hemoglobin 10/16/2021 was 12.1, no transfusion indicated.  Per PAT RN, patient stated on preop phone call that she feels her pain has been better controlled since restarting [place].  Patient will need day of surgery evaluation.  EKG 08/16/2021 (Care Everywhere): Sinus tachycardia.  Rate 123.  Nonspecific ST-T changes.  TTE  06/20/21 (Care Everywhere) SUMMARY  The left ventricular size is normal.  Left ventricular systolic function is normal.  LV ejection fraction = 60-65%.  The right ventricle is normal in size and function.  The left atrium is mildly dilated.  There was insufficient TR detected to calculate RV systolic pressure.  IVC size was normal.  There is no significant valvular stenosis or regurgitation.  There is no pericardial effusion.  There is no comparison study available     Stephanie Burch [facility] Short Stay Center/Anesthesiology Phone [phone] 10/17/2021 4:34 PM

## 2021-10-18 ENCOUNTER — Ambulatory Visit (HOSPITAL_COMMUNITY)
Admission: RE | Admit: 2021-10-18 | Discharge: 2021-10-18 | Disposition: A | Discharge status: Home / Self Care | Payer: Medicare Other | Attending: Oral Surgery | Admitting: Oral Surgery

## 2021-10-18 ENCOUNTER — Ambulatory Visit (HOSPITAL_COMMUNITY): Payer: Medicare Other | Admitting: Physician Assistant

## 2021-10-18 ENCOUNTER — Encounter (HOSPITAL_COMMUNITY)
Admission: RE | Disposition: A | Discharge status: Home / Self Care | Payer: Self-pay | Source: Home / Self Care | Attending: Oral Surgery

## 2021-10-18 ENCOUNTER — Other Ambulatory Visit: Payer: Self-pay

## 2021-10-18 ENCOUNTER — Ambulatory Visit (HOSPITAL_BASED_OUTPATIENT_CLINIC_OR_DEPARTMENT_OTHER): Payer: Medicare Other | Admitting: Physician Assistant

## 2021-10-18 ENCOUNTER — Encounter (HOSPITAL_COMMUNITY): Payer: Self-pay | Admitting: Oral Surgery

## 2021-10-18 DIAGNOSIS — Z6841 Body Mass Index (BMI) 40.0 and over, adult: Secondary | ICD-10-CM | POA: Diagnosis not present

## 2021-10-18 DIAGNOSIS — D571 Sickle-cell disease without crisis: Secondary | ICD-10-CM | POA: Diagnosis not present

## 2021-10-18 DIAGNOSIS — F1120 Opioid dependence, uncomplicated: Secondary | ICD-10-CM | POA: Insufficient documentation

## 2021-10-18 DIAGNOSIS — K029 Dental caries, unspecified: Secondary | ICD-10-CM | POA: Insufficient documentation

## 2021-10-18 DIAGNOSIS — K0889 Other specified disorders of teeth and supporting structures: Secondary | ICD-10-CM

## 2021-10-18 HISTORY — PX: TOOTH EXTRACTION: SHX859

## 2021-10-18 HISTORY — DX: Other chronic pain: G89.29

## 2021-10-18 HISTORY — DX: Anemia, unspecified: D64.9

## 2021-10-18 LAB — POCT PREGNANCY, URINE: Preg Test, Ur: NEGATIVE

## 2021-10-18 SURGERY — DENTAL RESTORATION/EXTRACTIONS
Anesthesia: General | Site: Mouth

## 2021-10-18 MED ORDER — 0.9 % SODIUM CHLORIDE (POUR BTL) OPTIME
TOPICAL | Status: DC | PRN
  Administered 2021-10-18: 1000 mL

## 2021-10-18 MED ORDER — MIDAZOLAM HCL 5 MG/5ML IJ SOLN
INTRAMUSCULAR | Status: DC | PRN
  Administered 2021-10-18: 2 mg via INTRAVENOUS

## 2021-10-18 MED ORDER — MIDAZOLAM HCL 2 MG/2ML IJ SOLN
INTRAMUSCULAR | Status: AC
  Filled 2021-10-18: qty 2

## 2021-10-18 MED ORDER — ONDANSETRON HCL 4 MG/2ML IJ SOLN: 4.0000 mg | Freq: Once | INTRAMUSCULAR | Status: DC | PRN

## 2021-10-18 MED ORDER — KETOROLAC TROMETHAMINE 30 MG/ML IJ SOLN
INTRAMUSCULAR | Status: DC | PRN
  Administered 2021-10-18: 30 mg via INTRAVENOUS

## 2021-10-18 MED ORDER — KETAMINE HCL 50 MG/5ML IJ SOSY
PREFILLED_SYRINGE | INTRAMUSCULAR | Status: AC
  Filled 2021-10-18: qty 5

## 2021-10-18 MED ORDER — FENTANYL CITRATE (PF) 250 MCG/5ML IJ SOLN
INTRAMUSCULAR | Status: AC
  Filled 2021-10-18: qty 5

## 2021-10-18 MED ORDER — CEFAZOLIN IN SODIUM CHLORIDE 3-0.9 GM/100ML-% IV SOLN
3.0000 g | Freq: Once | INTRAVENOUS | Status: AC
  Administered 2021-10-18: 3 g via INTRAVENOUS
  Filled 2021-10-18: qty 100

## 2021-10-18 MED ORDER — SODIUM CHLORIDE 0.9 % IR SOLN
Status: DC | PRN
  Administered 2021-10-18: 250 mL

## 2021-10-18 MED ORDER — ROCURONIUM BROMIDE 10 MG/ML (PF) SYRINGE
PREFILLED_SYRINGE | INTRAVENOUS | Status: AC
  Filled 2021-10-18: qty 10

## 2021-10-18 MED ORDER — OXYMETAZOLINE HCL 0.05 % NA SOLN
NASAL | Status: AC
  Filled 2021-10-18: qty 30

## 2021-10-18 MED ORDER — HYDROMORPHONE HCL 1 MG/ML IJ SOLN: 0.2500 mg | INTRAMUSCULAR | Status: DC | PRN

## 2021-10-18 MED ORDER — AMOXICILLIN 500 MG PO CAPS: 500.0000 mg | ORAL_CAPSULE | Freq: Three times a day (TID) | ORAL | 0 refills | Status: DC

## 2021-10-18 MED ORDER — CEFAZOLIN IN SODIUM CHLORIDE 3-0.9 GM/100ML-% IV SOLN
3.0000 g | Freq: Three times a day (TID) | INTRAVENOUS | Status: DC
  Filled 2021-10-18: qty 100

## 2021-10-18 MED ORDER — LACTATED RINGERS IV SOLN: INTRAVENOUS | Status: DC

## 2021-10-18 MED ORDER — KETOROLAC TROMETHAMINE 30 MG/ML IJ SOLN
INTRAMUSCULAR | Status: AC
  Filled 2021-10-18: qty 1

## 2021-10-18 MED ORDER — ORAL CARE MOUTH RINSE: 15.0000 mL | Freq: Once | OROMUCOSAL | Status: AC

## 2021-10-18 MED ORDER — CEFAZOLIN SODIUM-DEXTROSE 2-4 GM/100ML-% IV SOLN
2.0000 g | INTRAVENOUS | Status: DC
  Filled 2021-10-18: qty 100

## 2021-10-18 MED ORDER — PROPOFOL 10 MG/ML IV BOLUS
INTRAVENOUS | Status: AC
  Filled 2021-10-18: qty 20

## 2021-10-18 MED ORDER — CHLORHEXIDINE GLUCONATE 0.12 % MT SOLN
15.0000 mL | Freq: Once | OROMUCOSAL | Status: AC
  Administered 2021-10-18: 15 mL via OROMUCOSAL
  Filled 2021-10-18: qty 15

## 2021-10-18 MED ORDER — SUGAMMADEX SODIUM 200 MG/2ML IV SOLN
INTRAVENOUS | Status: DC | PRN
  Administered 2021-10-18: 294.8 mg via INTRAVENOUS

## 2021-10-18 MED ORDER — LIDOCAINE 2% (20 MG/ML) 5 ML SYRINGE
INTRAMUSCULAR | Status: AC
  Filled 2021-10-18: qty 5

## 2021-10-18 MED ORDER — AMISULPRIDE (ANTIEMETIC) 5 MG/2ML IV SOLN: 10.0000 mg | Freq: Once | INTRAVENOUS | Status: DC | PRN

## 2021-10-18 MED ORDER — LIDOCAINE-EPINEPHRINE 2 %-1:100000 IJ SOLN
INTRAMUSCULAR | Status: AC
  Filled 2021-10-18: qty 1

## 2021-10-18 MED ORDER — DEXAMETHASONE SODIUM PHOSPHATE 10 MG/ML IJ SOLN
INTRAMUSCULAR | Status: DC | PRN
  Administered 2021-10-18: 10 mg via INTRAVENOUS

## 2021-10-18 MED ORDER — LIDOCAINE-EPINEPHRINE 2 %-1:100000 IJ SOLN
INTRAMUSCULAR | Status: DC | PRN
  Administered 2021-10-18: 10 mL via INTRADERMAL

## 2021-10-18 MED ORDER — LIDOCAINE HCL (CARDIAC) PF 100 MG/5ML IV SOSY
PREFILLED_SYRINGE | INTRAVENOUS | Status: DC | PRN
  Administered 2021-10-18: 100 mg via INTRAVENOUS

## 2021-10-18 MED ORDER — SUCCINYLCHOLINE CHLORIDE 200 MG/10ML IV SOSY
PREFILLED_SYRINGE | INTRAVENOUS | Status: DC | PRN
  Administered 2021-10-18: 160 mg via INTRAVENOUS

## 2021-10-18 MED ORDER — FENTANYL CITRATE (PF) 100 MCG/2ML IJ SOLN
INTRAMUSCULAR | Status: DC | PRN
  Administered 2021-10-18: 100 ug via INTRAVENOUS

## 2021-10-18 MED ORDER — OXYCODONE HCL 5 MG/5ML PO SOLN: 5.0000 mg | Freq: Once | ORAL | Status: DC | PRN

## 2021-10-18 MED ORDER — PROPOFOL 10 MG/ML IV BOLUS
INTRAVENOUS | Status: DC | PRN
  Administered 2021-10-18: 200 mg via INTRAVENOUS

## 2021-10-18 MED ORDER — DEXMEDETOMIDINE HCL IN NACL 80 MCG/20ML IV SOLN
INTRAVENOUS | Status: AC
  Filled 2021-10-18: qty 20

## 2021-10-18 MED ORDER — DEXAMETHASONE SODIUM PHOSPHATE 10 MG/ML IJ SOLN
INTRAMUSCULAR | Status: AC
  Filled 2021-10-18: qty 1

## 2021-10-18 MED ORDER — OXYCODONE HCL 5 MG PO TABS: 5.0000 mg | ORAL_TABLET | Freq: Once | ORAL | Status: DC | PRN

## 2021-10-18 MED ORDER — ACETAMINOPHEN 500 MG PO TABS
1000.0000 mg | ORAL_TABLET | Freq: Once | ORAL | Status: AC
  Administered 2021-10-18: 1000 mg via ORAL
  Filled 2021-10-18: qty 2

## 2021-10-18 MED ORDER — ROCURONIUM BROMIDE 100 MG/10ML IV SOLN
INTRAVENOUS | Status: DC | PRN
  Administered 2021-10-18: 20 mg via INTRAVENOUS

## 2021-10-18 MED ORDER — ONDANSETRON HCL 4 MG/2ML IJ SOLN
INTRAMUSCULAR | Status: AC
  Filled 2021-10-18: qty 2

## 2021-10-18 SURGICAL SUPPLY — 36 items
BAG COUNTER SPONGE SURGICOUNT (BAG) IMPLANT
BLADE SURG 15 STRL LF DISP TIS (BLADE) ×1 IMPLANT
BLADE SURG 15 STRL SS (BLADE) ×1
BUR CROSS CUT FISSURE 1.6 (BURR) ×1 IMPLANT
BUR EGG ELITE 4.0 (BURR) ×1 IMPLANT
CANISTER SUCT 3000ML PPV (MISCELLANEOUS) ×1 IMPLANT
COVER SURGICAL LIGHT HANDLE (MISCELLANEOUS) ×1 IMPLANT
DRAPE U-SHAPE 76X120 STRL (DRAPES) ×1 IMPLANT
GAUZE PACKING FOLDED 2  STR (GAUZE/BANDAGES/DRESSINGS) ×1
GAUZE PACKING FOLDED 2 STR (GAUZE/BANDAGES/DRESSINGS) ×1 IMPLANT
GLOVE BIO SURGEON STRL SZ 6.5 (GLOVE) IMPLANT
GLOVE BIO SURGEON STRL SZ7 (GLOVE) IMPLANT
GLOVE BIO SURGEON STRL SZ8 (GLOVE) ×1 IMPLANT
GLOVE BIOGEL PI IND STRL 6.5 (GLOVE) IMPLANT
GLOVE BIOGEL PI IND STRL 7.0 (GLOVE) IMPLANT
GOWN STRL REUS W/ TWL LRG LVL3 (GOWN DISPOSABLE) ×1 IMPLANT
GOWN STRL REUS W/ TWL XL LVL3 (GOWN DISPOSABLE) ×1 IMPLANT
GOWN STRL REUS W/TWL LRG LVL3 (GOWN DISPOSABLE) ×1
GOWN STRL REUS W/TWL XL LVL3 (GOWN DISPOSABLE) ×1
IV NS 1000ML (IV SOLUTION) ×1
IV NS 1000ML BAXH (IV SOLUTION) ×1 IMPLANT
KIT BASIN OR (CUSTOM PROCEDURE TRAY) ×1 IMPLANT
KIT TURNOVER KIT B (KITS) ×1 IMPLANT
NDL HYPO 25GX1X1/2 BEV (NEEDLE) ×2 IMPLANT
NEEDLE HYPO 25GX1X1/2 BEV (NEEDLE) ×2 IMPLANT
NS IRRIG 1000ML POUR BTL (IV SOLUTION) ×1 IMPLANT
PAD ARMBOARD 7.5X6 YLW CONV (MISCELLANEOUS) ×1 IMPLANT
SLEEVE IRRIGATION ELITE 7 (MISCELLANEOUS) ×1 IMPLANT
SPIKE FLUID TRANSFER (MISCELLANEOUS) ×1 IMPLANT
SPONGE SURGIFOAM ABS GEL 12-7 (HEMOSTASIS) IMPLANT
SUT CHROMIC 3 0 PS 2 (SUTURE) ×1 IMPLANT
SYR BULB IRRIG 60ML STRL (SYRINGE) ×1 IMPLANT
SYR CONTROL 10ML LL (SYRINGE) ×1 IMPLANT
TRAY ENT MC OR (CUSTOM PROCEDURE TRAY) ×1 IMPLANT
TUBING IRRIGATION (MISCELLANEOUS) ×1 IMPLANT
YANKAUER SUCT BULB TIP NO VENT (SUCTIONS) ×1 IMPLANT

## 2021-10-18 NOTE — Op Note (Unsigned)
NAMERoxan, Yamamoto [facility] MEDICAL RECORD NO: [id number] ACCOUNT NO: [id number] DATE OF BIRTH: 04/06/1980 FACILITY: [facility] LOCATION: [facility]-PERIOP PHYSICIAN: Georgia Lopes, DDS  Operative Report   DATE OF PROCEDURE: 10/18/2021  PREOPERATIVE DIAGNOSES:  Nonrestorable teeth numbers 1, 17 and 32, secondary to dental caries, morbid obesity and sickle cell anemia.  POSTOPERATIVE DIAGNOSES:  Nonrestorable teeth numbers 1, 17 and 32, secondary to dental caries, morbid obesity and sickle cell anemia.  PROCEDURE:  Extraction teeth 1, 17 and 32.  SURGEON:  Georgia Lopes, DDS  ANESTHESIA:  General, oral intubation.  Dr. Clemens Catholic attending.  DESCRIPTION OF PROCEDURE:  The patient was taken to the operating room and placed on table in supine position.  General anesthesia was administered.  An oral endotracheal tube was placed and secured.  The eyes were protected and the patient was draped  for surgery.  Timeout was performed.  The posterior pharynx was suctioned and a throat pack was placed.  2% lidocaine 1:100,000 epinephrine was infiltrated in an inferior alveolar block on the right and left sides and in buccal and palatal infiltration  around tooth #1.  A bite block was placed on the right side of the mouth.  A sweetheart retractor was used to retract the tongue.  A #15 blade used to make an incision in the gingival sulcus around tooth #17.  The periosteum was reflected with a  periosteal elevator. The tooth was elevated with 301 elevator and removed from the mouth with the dental forceps.  The socket was curetted, debrided, irrigated and closed with 3-0 chromic.  The endotracheal tube was then repositioned to the other side of  the mouth and a 15 blade used to make an incision around teeth numbers 1 and 32.  The periosteum was reflected from around these teeth.  The teeth were elevated from the mouth and removed with the dental forceps.  The sockets were curetted, irrigated.   Tooth #32 was sutured with a 3-0  chromic.  Tooth #1 did not require suture.  Then, the oral cavity was irrigated and suctioned.  The throat pack was removed.  The patient was left under care of anesthesia for extubation and transport to recovery room  with plans for discharge home through day surgery.  ESTIMATED BLOOD LOSS:  Minimum.  COMPLICATIONS:  None.  SPECIMENS:  None.   SHW D: 10/18/2021 8:04:19 am T: 10/18/2021 8:20:00 am  JOB: [id number]/ [id number]

## 2021-10-18 NOTE — Anesthesia Procedure Notes (Signed)
Procedure Name: Intubation Date/Time: 10/18/2021 7:38 AM  Performed by: Cy Blamer, CRNAPre-anesthesia Checklist: Patient identified, Emergency Drugs available, Suction available and Patient being monitored Patient Re-evaluated:Patient Re-evaluated prior to induction Oxygen Delivery Method: Circle system utilized Preoxygenation: Pre-oxygenation with 100% oxygen Induction Type: IV induction Ventilation: Mask ventilation without difficulty Laryngoscope Size: Miller and 2 Grade View: Grade II Tube type: Oral Tube size: 7.0 mm Number of attempts: 1 Airway Equipment and Method: Stylet Placement Confirmation: ETT inserted through vocal cords under direct vision, positive ETCO2 and breath sounds checked- equal and bilateral Secured at: 23 cm Tube secured with: Tape Dental Injury: Teeth and Oropharynx as per pre-operative assessment

## 2021-10-18 NOTE — H&P (Signed)
H&P documentation  -History and Physical Reviewed  -Patient has been re-examined  -No change in the plan of care  Stephanie Burch  

## 2021-10-18 NOTE — Transfer of Care (Signed)
Immediate Anesthesia Transfer of Care Note  Patient: Stephanie Burch  Procedure(s) Performed: DENTAL RESTORATION/EXTRACTIONS (Mouth)  Patient Location: PACU  Anesthesia Type:General  Level of Consciousness: awake, drowsy and patient cooperative  Airway & Oxygen Therapy: Patient Spontanous Breathing and Patient connected to face mask oxygen  Post-op Assessment: Report given to RN, Post -op Vital signs reviewed and stable and Patient moving all extremities X 4  Post vital signs: Reviewed and stable  Last Vitals:  Vitals Value Taken Time  BP    Temp    Pulse 104 10/18/21 0810  Resp 28 10/18/21 0810  SpO2 97 % 10/18/21 0810  Vitals shown include unvalidated device data.  Last Pain:  Vitals:   10/18/21 0649  TempSrc:   PainSc: 0-No pain         Complications: No notable events documented.

## 2021-10-18 NOTE — Anesthesia Postprocedure Evaluation (Signed)
Anesthesia Post Note  Patient: Stephanie Burch  Procedure(s) Performed: DENTAL RESTORATION/EXTRACTIONS (Mouth)     Patient location during evaluation: PACU Anesthesia Type: General Level of consciousness: awake and alert Pain management: pain level controlled Vital Signs Assessment: post-procedure vital signs reviewed and stable Respiratory status: spontaneous breathing, nonlabored ventilation and respiratory function stable Cardiovascular status: blood pressure returned to baseline and stable Postop Assessment: no apparent nausea or vomiting Anesthetic complications: no   No notable events documented.  Last Vitals:  Vitals:   10/18/21 0830 10/18/21 0845  BP: (!) 143/86 (!) 146/85  Pulse: 96 91  Resp: (!) 22 20  Temp:    SpO2: 93% 92%    Last Pain:  Vitals:   10/18/21 0845  TempSrc:   PainSc: 0-No pain                 Lucretia Kern

## 2021-10-18 NOTE — Op Note (Signed)
10/18/2021  8:01 AM  PATIENT:  Stephanie Burch  41 y.o. female  PRE-OPERATIVE DIAGNOSIS:  NON-RESTORABLE TEETH # 1, 17, 32 SECONDARY TO DENTAL CARIES. MORBID OBESITY, SICKLE CELL ANEMIA  POST-OPERATIVE DIAGNOSIS:  SAME  PROCEDURE:  Procedure(s): EXTRACTION  TEETH # 1, 17, 32   SURGEON:  Surgeon(s): Ocie Doyne, DMD  ANESTHESIA:   local and general  EBL:  minimal  DRAINS: none   SPECIMEN:  No Specimen  COUNTS:  YES  PLAN OF CARE: Discharge to home after PACU  PATIENT DISPOSITION:  PACU - hemodynamically stable.   PROCEDURE DETAILS: Dictation # [id number]  Stephanie Burch, DMD 10/18/2021 8:01 AM

## 2021-10-19 ENCOUNTER — Encounter (HOSPITAL_COMMUNITY): Payer: Self-pay | Admitting: Oral Surgery

## 2021-11-07 ENCOUNTER — Encounter (HOSPITAL_COMMUNITY): Payer: Self-pay

## 2021-11-07 ENCOUNTER — Emergency Department (HOSPITAL_COMMUNITY): Payer: Medicare Other

## 2021-11-07 ENCOUNTER — Inpatient Hospital Stay (HOSPITAL_COMMUNITY)
Admission: EM | Admit: 2021-11-07 | Discharge: 2021-11-11 | DRG: 812 | Disposition: A | Discharge status: Home / Self Care | Payer: Medicare Other | Attending: Internal Medicine | Admitting: Internal Medicine

## 2021-11-07 DIAGNOSIS — Z832 Family history of diseases of the blood and blood-forming organs and certain disorders involving the immune mechanism: Secondary | ICD-10-CM

## 2021-11-07 DIAGNOSIS — F1120 Opioid dependence, uncomplicated: Secondary | ICD-10-CM | POA: Diagnosis present

## 2021-11-07 DIAGNOSIS — Z6841 Body Mass Index (BMI) 40.0 and over, adult: Secondary | ICD-10-CM

## 2021-11-07 DIAGNOSIS — B001 Herpesviral vesicular dermatitis: Secondary | ICD-10-CM | POA: Diagnosis present

## 2021-11-07 DIAGNOSIS — Z833 Family history of diabetes mellitus: Secondary | ICD-10-CM

## 2021-11-07 DIAGNOSIS — D638 Anemia in other chronic diseases classified elsewhere: Secondary | ICD-10-CM | POA: Diagnosis present

## 2021-11-07 DIAGNOSIS — E559 Vitamin D deficiency, unspecified: Secondary | ICD-10-CM | POA: Diagnosis present

## 2021-11-07 DIAGNOSIS — Z8249 Family history of ischemic heart disease and other diseases of the circulatory system: Secondary | ICD-10-CM

## 2021-11-07 DIAGNOSIS — Z79899 Other long term (current) drug therapy: Secondary | ICD-10-CM

## 2021-11-07 DIAGNOSIS — G894 Chronic pain syndrome: Secondary | ICD-10-CM | POA: Diagnosis present

## 2021-11-07 DIAGNOSIS — D5700 Hb-SS disease with crisis, unspecified: Principal | ICD-10-CM | POA: Diagnosis present

## 2021-11-07 LAB — COMPREHENSIVE METABOLIC PANEL
ALT: 39 U/L (ref 0–44)
AST: 26 U/L (ref 15–41)
Albumin: 3.7 g/dL (ref 3.5–5.0)
Alkaline Phosphatase: 71 U/L (ref 38–126)
Anion gap: 8 (ref 5–15)
BUN: 9 mg/dL (ref 6–20)
CO2: 21 mmol/L — ABNORMAL LOW (ref 22–32)
Calcium: 9.1 mg/dL (ref 8.9–10.3)
Chloride: 111 mmol/L (ref 98–111)
Creatinine, Ser: 0.56 mg/dL (ref 0.44–1.00)
GFR, Estimated: >60 mL/min (ref >60)
Glucose, Bld: 122 mg/dL — ABNORMAL HIGH (ref 70–99)
Potassium: 4.1 mmol/L (ref 3.5–5.1)
Sodium: 140 mmol/L (ref 135–145)
Total Bilirubin: 0.7 mg/dL (ref 0.3–1.2)
Total Protein: 7.3 g/dL (ref 6.5–8.1)

## 2021-11-07 LAB — DIFFERENTIAL
Abs Immature Granulocytes: 0.04 10*3/uL (ref 0.00–0.07)
Basophils Absolute: 0 10*3/uL (ref 0.0–0.1)
Basophils Relative: 0 %
Eosinophils Absolute: 0.3 10*3/uL (ref 0.0–0.5)
Eosinophils Relative: 3 %
Immature Granulocytes: 1 %
Lymphocytes Relative: 29 %
Lymphs Abs: 2.3 10*3/uL (ref 0.7–4.0)
Monocytes Absolute: 0.7 10*3/uL (ref 0.1–1.0)
Monocytes Relative: 9 %
Neutro Abs: 4.8 10*3/uL (ref 1.7–7.7)
Neutrophils Relative %: 58 %

## 2021-11-07 LAB — CBC
HCT: 34.6 % — ABNORMAL LOW (ref 36.0–46.0)
Hemoglobin: 11.8 g/dL — ABNORMAL LOW (ref 12.0–15.0)
MCH: 23.9 pg — ABNORMAL LOW (ref 26.0–34.0)
MCHC: 34.1 g/dL (ref 30.0–36.0)
MCV: 70 fL — ABNORMAL LOW (ref 80.0–100.0)
Platelets: 198 10*3/uL (ref 150–400)
RBC: 4.94 MIL/uL (ref 3.87–5.11)
RDW: 16.3 % — ABNORMAL HIGH (ref 11.5–15.5)
WBC: 8.2 10*3/uL (ref 4.0–10.5)
nRBC: 1.7 % — ABNORMAL HIGH (ref 0.0–0.2)

## 2021-11-07 LAB — RETICULOCYTES
Immature Retic Fract: 43.4 % — ABNORMAL HIGH (ref 2.3–15.9)
RBC.: 4.95 MIL/uL (ref 3.87–5.11)
Retic Count, Absolute: 279.7 10*3/uL — ABNORMAL HIGH (ref 19.0–186.0)
Retic Ct Pct: 5.7 % — ABNORMAL HIGH (ref 0.4–3.1)

## 2021-11-07 MED ORDER — SENNOSIDES-DOCUSATE SODIUM 8.6-50 MG PO TABS
1.0000 | ORAL_TABLET | Freq: Two times a day (BID) | ORAL | Status: DC
  Administered 2021-11-07 – 2021-11-11 (×3): 1 via ORAL
  Filled 2021-11-07 (×7): qty 1

## 2021-11-07 MED ORDER — VOXELOTOR 500 MG PO TABS: 1500.0000 mg | ORAL_TABLET | Freq: Every day | ORAL | Status: DC

## 2021-11-07 MED ORDER — SODIUM CHLORIDE 0.9 % IV SOLN
12.5000 mg | Freq: Once | INTRAVENOUS | Status: AC
  Administered 2021-11-07: 12.5 mg via INTRAVENOUS
  Filled 2021-11-07: qty 12.5

## 2021-11-07 MED ORDER — DIPHENHYDRAMINE HCL 25 MG PO CAPS: 25.0000 mg | ORAL_CAPSULE | ORAL | Status: DC | PRN

## 2021-11-07 MED ORDER — ONDANSETRON HCL 4 MG/2ML IJ SOLN
4.0000 mg | INTRAMUSCULAR | Status: DC | PRN
  Administered 2021-11-07 – 2021-11-08 (×2): 4 mg via INTRAVENOUS
  Filled 2021-11-07 (×2): qty 2

## 2021-11-07 MED ORDER — SODIUM CHLORIDE 0.45 % IV SOLN: INTRAVENOUS | Status: DC

## 2021-11-07 MED ORDER — ONDANSETRON HCL 4 MG/2ML IJ SOLN: 4.0000 mg | Freq: Four times a day (QID) | INTRAMUSCULAR | Status: DC | PRN

## 2021-11-07 MED ORDER — HYDROMORPHONE HCL 1 MG/ML IJ SOLN: 1.0000 mg | INTRAMUSCULAR | Status: DC | PRN

## 2021-11-07 MED ORDER — OXYCODONE HCL ER 15 MG PO T12A
30.0000 mg | EXTENDED_RELEASE_TABLET | Freq: Two times a day (BID) | ORAL | Status: DC
  Administered 2021-11-07 – 2021-11-11 (×9): 30 mg via ORAL
  Filled 2021-11-07 (×9): qty 2

## 2021-11-07 MED ORDER — KETOROLAC TROMETHAMINE 15 MG/ML IJ SOLN
15.0000 mg | Freq: Four times a day (QID) | INTRAMUSCULAR | Status: DC
  Administered 2021-11-07 – 2021-11-11 (×16): 15 mg via INTRAVENOUS
  Filled 2021-11-07 (×16): qty 1

## 2021-11-07 MED ORDER — SODIUM CHLORIDE 0.9% FLUSH: 9.0000 mL | INTRAVENOUS | Status: DC | PRN

## 2021-11-07 MED ORDER — FOLIC ACID 1 MG PO TABS
1.0000 mg | ORAL_TABLET | Freq: Every day | ORAL | Status: DC
  Administered 2021-11-07 – 2021-11-11 (×5): 1 mg via ORAL
  Filled 2021-11-07 (×5): qty 1

## 2021-11-07 MED ORDER — HYDROMORPHONE HCL 2 MG/ML IJ SOLN
2.0000 mg | INTRAMUSCULAR | Status: AC
  Administered 2021-11-07: 2 mg via INTRAVENOUS
  Filled 2021-11-07: qty 1

## 2021-11-07 MED ORDER — NALOXONE HCL 0.4 MG/ML IJ SOLN: 0.4000 mg | INTRAMUSCULAR | Status: DC | PRN

## 2021-11-07 MED ORDER — ENOXAPARIN SODIUM 40 MG/0.4ML IJ SOSY
40.0000 mg | PREFILLED_SYRINGE | INTRAMUSCULAR | Status: DC
  Administered 2021-11-07 – 2021-11-10 (×4): 40 mg via SUBCUTANEOUS
  Filled 2021-11-07 (×4): qty 0.4

## 2021-11-07 MED ORDER — HYDROMORPHONE 1 MG/ML IV SOLN
INTRAVENOUS | Status: DC
  Administered 2021-11-07: 1 mg via INTRAVENOUS
  Administered 2021-11-07: 9.2 mg via INTRAVENOUS
  Administered 2021-11-08: 9 mg via INTRAVENOUS
  Administered 2021-11-08: 9.5 mg via INTRAVENOUS
  Administered 2021-11-08: 9 mg via INTRAVENOUS
  Administered 2021-11-09: 9.5 mg via INTRAVENOUS
  Administered 2021-11-09: 7.5 mg via INTRAVENOUS
  Administered 2021-11-09: 10 mg via INTRAVENOUS
  Administered 2021-11-09: 6.5 mg via INTRAVENOUS
  Administered 2021-11-09 – 2021-11-10 (×2): 9 mg via INTRAVENOUS
  Administered 2021-11-10: 4.5 mg via INTRAVENOUS
  Administered 2021-11-10: 5 mg via INTRAVENOUS
  Administered 2021-11-10: 18 mg via INTRAVENOUS
  Administered 2021-11-10: 9.5 mg via INTRAVENOUS
  Administered 2021-11-10: 1 mg via INTRAVENOUS
  Administered 2021-11-10: 5 mg via INTRAVENOUS
  Administered 2021-11-10: 5.5 mg via INTRAVENOUS
  Administered 2021-11-11: 0.5 mg via INTRAVENOUS
  Administered 2021-11-11: 2 mg via INTRAVENOUS
  Filled 2021-11-07 (×7): qty 30

## 2021-11-07 MED ORDER — POLYETHYLENE GLYCOL 3350 17 G PO PACK: 17.0000 g | PACK | Freq: Every day | ORAL | Status: DC | PRN

## 2021-11-07 NOTE — H&P (Signed)
H&P  Patient Demographics:  Stephanie Burch, is a 41 y.o. female  MRN: [id number]   DOB - 15-May-1980  Admit Date - 11/07/2021  Outpatient Primary MD for the patient is Pcp, No  Chief Complaint  Patient presents with   Sickle Cell Pain Crisis      HPI:   Stephanie Burch  is a 41 y.o. female with a medical history significant for sickle cell disease, chronic pain syndrome, opiate dependence, and tolerance, and morbid obesity presents to the emergency department with complaints of lower extremity pain. Patient  says that lower extremity pain has been increasing over the past 24 hours and has been unrelieved by home medications. Patient's home medications include Xtampza and dilaudid. Opiate medications are managed by patient's hematology team at [facility]. She rates lower extremity pain at 10/10, constant and sharp. She denies fever, chills, chest pain, or shortness of breath. No urinary symptoms, nausea, vomiting, or diarrhea. No sick contacts, recent travel, or exposure to COVID 19.  ER Course:  BP 138/72 (BP Location: Left Arm)   Pulse 88   Temp 97.7 F (36.5 C) (Oral)   Resp 18   Ht 5\' 5"  (1.651 m)   Wt (!) 145.2 kg   LMP 10/31/2021   SpO2 90%   BMI 53.25 kg/m  Reviewed all laboratory values, unremarkable. Patient admitted to med surg for further treatment and evaluation of sickle cell pain crisis.    Review of systems:  In addition to the HPI above, patient reports No fever or chills No Headache, No changes with vision or hearing No problems swallowing food or liquids No chest pain, cough or shortness of breath No abdominal pain, No nausea or vomiting, Bowel movements are regular No blood in stool or urine No dysuria No new skin rashes or bruises No new joints pains-aches No new weakness, tingling, numbness in any extremity No recent weight gain or loss No polyuria, polydypsia or polyphagia No significant Mental Stressors  negative.  With Past History of the  following :   Past Medical History:  Diagnosis Date   Anemia    Chronic pain    Depression    History of blood transfusion 2013   x 2   HSV infection    Pneumonia    x 1   Sickle cell anemia (HCC) 09/2021   gets them frequently   Vitamin B12 deficiency 12/2018   Vitamin D deficiency       Past Surgical History:  Procedure Laterality Date   CESAREAN SECTION  2013   x 1   TOOTH EXTRACTION N/A 10/18/2021   Procedure: DENTAL RESTORATION/EXTRACTIONS;  Surgeon: 10/20/2021, DMD;  Location: [facility] OR;  Service: Oral Surgery;  Laterality: N/A;     Social History:   Social History   Tobacco Use   Smoking status: Never   Smokeless tobacco: Never  Substance Use Topics   Alcohol use: Not Currently     Lives - At home   Family History :   Family History  Problem Relation Age of Onset   Hypertension Mother    Sickle cell trait Mother    Diabetes Father    Sickle cell trait Father      Home Medications:   Prior to Admission medications   Medication Sig Start Date End Date Taking? Authorizing Provider  acetaminophen (TYLENOL) 500 MG tablet Take 1,000 mg by mouth every 6 (six) hours as needed for mild pain, moderate pain or headache.   Yes [provider]  ergocalciferol (VITAMIN D2) 1.25 MG (50000 UT) capsule Take 50,000 Units by mouth every Sunday.   Yes [provider]  folic acid (FOLVITE) 1 MG tablet Take 1 mg by mouth daily.   Yes [provider]  HYDROmorphone (DILAUDID) 4 MG tablet Take 4 mg by mouth every 6 (six) hours as needed for severe pain.   Yes [provider]  ibuprofen (ADVIL) 200 MG tablet Take 800 mg by mouth 3 (three) times daily as needed for fever, headache or mild pain.   Yes [provider]  OXBRYTA 500 MG TABS tablet Take 1,500 mg by mouth daily in the afternoon. 10/04/21  Yes [provider]  oxycodone (ROXICODONE) 30 MG immediate release tablet Take 30 mg by mouth every 4 (four) hours as needed for  pain.   Yes [provider]  amoxicillin (AMOXIL) 500 MG capsule Take 1 capsule (500 mg total) by mouth 3 (three) times daily. Patient not taking: Reported on 11/07/2021 10/18/21   Ocie Doyne, DMD     Allergies:   No Known Allergies   Physical Exam:   Vitals:   Vitals:   11/07/21 1650 11/07/21 1736  BP:  138/72  Pulse:  88  Resp: 18 18  Temp:  97.7 F (36.5 C)  SpO2: 98% 90%    Physical Exam: Constitutional: Patient appears well-developed and well-nourished. Not in obvious distress. HENT: Normocephalic, atraumatic, External right and left ear normal. Oropharynx is clear and moist.  Eyes: Conjunctivae and EOM are normal. PERRLA, no scleral icterus. Neck: Normal ROM. Neck supple. No JVD. No tracheal deviation. No thyromegaly. CVS: RRR, S1/S2 +, no murmurs, no gallops, no carotid bruit.  Pulmonary: Effort and breath sounds normal, no stridor, rhonchi, wheezes, rales.  Abdominal: Soft. BS +, no distension, tenderness, rebound or guarding.  Musculoskeletal: Normal range of motion. No edema and no tenderness.  Lymphadenopathy: No lymphadenopathy noted, cervical, inguinal or axillary Neuro: Alert. Normal reflexes, muscle tone coordination. No cranial nerve deficit. Skin: Skin is warm and dry. No rash noted. Not diaphoretic. No erythema. No pallor. Psychiatric: Normal mood and affect. Behavior, judgment, thought content normal.   Data Review:   CBC Recent Labs  Lab 11/07/21 0943  WBC 8.2  HGB 11.8*  HCT 34.6*  PLT 198  MCV 70.0*  MCH 23.9*  MCHC 34.1  RDW 16.3*  LYMPHSABS 2.3  MONOABS 0.7  EOSABS 0.3  BASOSABS 0.0   ------------------------------------------------------------------------------------------------------------------  Chemistries  Recent Labs  Lab 11/07/21 0849  NA 140  K 4.1  CL 111  CO2 21*  GLUCOSE 122*  BUN 9  CREATININE 0.56  CALCIUM 9.1  AST 26  ALT 39  ALKPHOS 71  BILITOT 0.7    ------------------------------------------------------------------------------------------------------------------ estimated creatinine clearance is 138.9 mL/min (by C-G formula based on SCr of 0.56 mg/dL). ------------------------------------------------------------------------------------------------------------------ No results for input(s): "TSH", "T4TOTAL", "T3FREE", "THYROIDAB" in the last 72 hours.  Invalid input(s): "FREET3"  Coagulation profile No results for input(s): "INR", "PROTIME" in the last 168 hours. ------------------------------------------------------------------------------------------------------------------- No results for input(s): "DDIMER" in the last 72 hours. -------------------------------------------------------------------------------------------------------------------  Cardiac Enzymes No results for input(s): "CKMB", "TROPONINI", "MYOGLOBIN" in the last 168 hours.  Invalid input(s): "CK" ------------------------------------------------------------------------------------------------------------------ No results found for: "BNP"  ---------------------------------------------------------------------------------------------------------------  Urinalysis    Component Value Date/Time   COLORURINE YELLOW 04/11/2021 0913   APPEARANCEUR CLEAR 04/11/2021 0913   LABSPEC 1.010 04/11/2021 0913   PHURINE 7.0 04/11/2021 0913   GLUCOSEU NEGATIVE 04/11/2021 0913   HGBUR NEGATIVE 04/11/2021 0913  BILIRUBINUR NEGATIVE 04/11/2021 0913   BILIRUBINUR Negative 04/27/2019 0935   KETONESUR NEGATIVE 04/11/2021 0913   PROTEINUR NEGATIVE 04/11/2021 0913   UROBILINOGEN 1.0 04/27/2019 0935   NITRITE NEGATIVE 04/11/2021 0913   LEUKOCYTESUR NEGATIVE 04/11/2021 0913    ----------------------------------------------------------------------------------------------------------------   Imaging Results:    No results found.   Assessment & Plan:  Principal Problem:    Sickle cell pain crisis ([place]) Active Problems:   Morbid obesity with body mass index (BMI) of 45.0 to 49.9 in adult [facility])   Chronic pain syndrome  Sickle cell disease with pain crisis:  Admit to med surg. Initiate IV dilaudid PCA Toradol 15 mg IV every 6 hours Oxycontin 30 mg every 12 hours Monitor vital signs closely, reevaluate pain scale regularly, and supplemental oxygen as needed.   Chronic pain syndrome:  Continue home medications  Morbid obesity:  Patient counseled at length on the importance of following a low fat, low carb diet. Patient has been unable to exercise due to chronic pain.  Heart healthy diet   DVT Prophylaxis: Subcut Lovenox   AM Labs Ordered, also please review Full Orders  Family Communication: Admission, patient's condition and plan of care including tests being ordered have been discussed with the patient who indicate understanding and agree with the plan and Code Status.  Code Status: Full Code  Consults called: None    Admission status: Inpatient    Time spent in minutes : 50 minutes   [place], MSN, FNP-C [facility] [address] [place], [place] [address] [phone]  11/07/2021 at 6:49 PM

## 2021-11-07 NOTE — ED Provider Notes (Signed)
[facility]-EMERGENCY DEPT Provider Note   CSN: [id number] Arrival date & time: 11/07/21  1191     History  Chief Complaint  Patient presents with   Sickle Cell Pain Crisis    Stephanie Burch is a 41 y.o. female.  Patient with history of sickle cell presents today with 2-3 day history of leg pain. She maintains routine follow up with her hematologist at [facility] every 3 months, last saw them in August per chart review. Pain is typically well-managed at home with oxycodone and diluatid which she normally takes every 6 hours but has had to take it more frequently over the past few days since her leg pain started. Reports that she typically gets her sickle cell pain in her arms and legs but today is only experiencing leg pain. Denies chest pain, dyspnea, leg swelling, abdominal pain and prior abdominal surgeries.   The history is provided by the patient.  Sickle Cell Pain Crisis Location:  Lower extremity Timing:  Constant Progression:  Unchanged Chronicity:  Recurrent Relieved by:  Nothing      Home Medications Prior to Admission medications   Medication Sig Start Date End Date Taking? Authorizing Provider  amoxicillin (AMOXIL) 500 MG capsule Take 1 capsule (500 mg total) by mouth 3 (three) times daily. 10/18/21   Ocie Doyne, DMD  ergocalciferol (VITAMIN D2) 1.25 MG (50000 UT) capsule Take 50,000 Units by mouth every Sunday.    [provider]  folic acid (FOLVITE) 1 MG tablet Take 1 mg by mouth daily.    [provider]  HYDROmorphone (DILAUDID) 4 MG tablet Take 4 mg by mouth every 6 (six) hours as needed for severe pain.    [provider]  ibuprofen (ADVIL) 200 MG tablet Take 400-800 mg by mouth 3 (three) times daily as needed for fever, headache or mild pain.    [provider]  ondansetron (ZOFRAN) 4 MG tablet Take 4 mg by mouth every 8 (eight) hours as needed for nausea/vomiting. 11/05/21   [provider]   OXBRYTA 500 MG TABS tablet Take 1,500 mg by mouth daily in the afternoon. 10/04/21   [provider]  oxycodone (ROXICODONE) 30 MG immediate release tablet Take 30 mg by mouth every 4 (four) hours as needed for pain.    [provider]  oxyCODONE ER (XTAMPZA ER) 27 MG C12A Take 27 mg by mouth 2 (two) times daily.    [provider]      Allergies    Patient has no known allergies.    Review of Systems   Review of Systems  Musculoskeletal:        Lower extremity pain bilaterally  All other systems reviewed and are negative.   Physical Exam Updated Vital Signs BP (!) 141/83   Pulse 83   Temp 98.8 F (37.1 C) (Oral)   Resp 18   Ht 5\' 5"  (1.651 m)   Wt (!) 145.2 kg   LMP 10/31/2021   SpO2 94%   BMI 53.25 kg/m  Physical Exam Constitutional:      Appearance: Normal appearance.     Comments: Appearing visibly uncomfortable from the pain  HENT:     Head: Normocephalic and atraumatic.     Right Ear: External ear normal.     Left Ear: External ear normal.     Nose: Nose normal.     Mouth/Throat:     Mouth: Mucous membranes are moist.     Pharynx: Oropharynx is clear.  Eyes:     General: No scleral icterus.       Right eye: No discharge.        Left eye: No discharge.     Conjunctiva/sclera: Conjunctivae normal.  Cardiovascular:     Rate and Rhythm: Normal rate and regular rhythm.     Pulses: Normal pulses.     Heart sounds: No murmur heard.    No gallop.  Pulmonary:     Effort: Pulmonary effort is normal. No respiratory distress.     Breath sounds: No stridor. No wheezing, rhonchi or rales.  Chest:     Chest wall: No tenderness.  Abdominal:     General: Bowel sounds are normal. There is no distension.     Palpations: Abdomen is soft. There is no mass.     Tenderness: There is no abdominal tenderness. There is no right CVA tenderness, left CVA tenderness, guarding or rebound.  Musculoskeletal:        General: Tenderness (tenderness upon  palpation of lower extremities bilaterally above the knees) present. No swelling.  Lymphadenopathy:     Cervical: No cervical adenopathy.  Skin:    Coloration: Skin is not jaundiced or pale.     Findings: No erythema or rash.  Neurological:     General: No focal deficit present.     Mental Status: She is alert.  Psychiatric:        Mood and Affect: Mood normal.     ED Results / Procedures / Treatments   Labs (all labs ordered are listed, but only abnormal results are displayed) Labs Reviewed  COMPREHENSIVE METABOLIC PANEL - Abnormal; Notable for the following components:      Result Value   CO2 21 (*)    Glucose, Bld 122 (*)    All other components within normal limits  CBC - Abnormal; Notable for the following components:   Hemoglobin 11.8 (*)    HCT 34.6 (*)    MCV 70.0 (*)    MCH 23.9 (*)    RDW 16.3 (*)    nRBC 1.7 (*)    All other components within normal limits  RETICULOCYTES - Abnormal; Notable for the following components:   Retic Ct Pct 5.7 (*)    Retic Count, Absolute 279.7 (*)    Immature Retic Fract 43.4 (*)    All other components within normal limits  DIFFERENTIAL  CBC WITH DIFFERENTIAL/PLATELET    EKG None  Radiology No results found.  Procedures Procedures  None  Medications Ordered in ED Medications  ondansetron (ZOFRAN) injection 4 mg (has no administration in time range)  HYDROmorphone (DILAUDID) injection 2 mg (2 mg Intravenous Given 11/07/21 0937)  HYDROmorphone (DILAUDID) injection 2 mg (2 mg Intravenous Given 11/07/21 1005)  HYDROmorphone (DILAUDID) injection 2 mg (2 mg Intravenous Given 11/07/21 1035)  diphenhydrAMINE (BENADRYL) 12.5 mg in sodium chloride 0.9 % 50 mL IVPB (0 mg Intravenous Stopped 11/07/21 1004)    ED Course/ Medical Decision Making/ A&P                           Medical Decision Making Amount and/or Complexity of Data Reviewed Labs: ordered.  Risk Prescription drug management. Decision regarding  hospitalization.   Patient with history of sickle cell and chronic pain syndrome presents with 2-3 days of bilateral lower extremity pain, pain regimen usually well-controlled on oxycodone and dilautid at home. She maintains routine 3 month follow up with her hematologist and has not  required a hospitalization for sickle cell crisis in years. Blood work significant for elevated retic count and mild Hgb of 11.8. Low concern for acute chest as she does not endorse chest pain and maintains appropriate respiratory status. No evidence of splenomegaly or dactylitis on exam. Low suspicion for avascular necrosis. Patient states that when she comes to the hospital, dilautid typically sufficiently manages her pain. Vitals stable and patient remains afebrile, low concern for infectious etiology. Given dilautid 2 mg x2. Upon reevaluation patient still in a significant amount of pain, reports that it is improved only slightly but still in tears. Discussed with patient that she will likely require further intervention for pain control, she is agreeable to this. She has now received another dilautid dose for a total of 6 mg. Spoke to Julianne Handler, FNP with sickle cell center who will admit patient to the medical floor for further observation and pain management.    Final Clinical Impression(s) / ED Diagnoses Final diagnoses:  Sickle cell pain crisis [facility])    Rx / DC Orders ED Discharge Orders     None         Reece Leader, DO 11/07/21 1046    Lorre Nick, MD 11/08/21 1207

## 2021-11-07 NOTE — ED Provider Notes (Signed)
I saw and evaluated the patient, reviewed the resident's note and I agree with the findings and plan.   41 year old female with history of sickle cell disease and chronic pain presents with pain to her lower extremity similar to her prior pain crisis.  On exam she has no evidence of joint involvement.  We will treat with IV narcotics and reassess   Lacretia Leigh, MD 11/07/21 [phone]

## 2021-11-07 NOTE — ED Triage Notes (Signed)
Pt presents with c/o sickle cell pain in both of her legs for 2 days. Pt reports that she has tried medicine at home with no relief.

## 2021-11-08 DIAGNOSIS — B001 Herpesviral vesicular dermatitis: Secondary | ICD-10-CM | POA: Diagnosis present

## 2021-11-08 DIAGNOSIS — F1120 Opioid dependence, uncomplicated: Secondary | ICD-10-CM | POA: Diagnosis present

## 2021-11-08 DIAGNOSIS — Z8249 Family history of ischemic heart disease and other diseases of the circulatory system: Secondary | ICD-10-CM | POA: Diagnosis not present

## 2021-11-08 DIAGNOSIS — Z833 Family history of diabetes mellitus: Secondary | ICD-10-CM | POA: Diagnosis not present

## 2021-11-08 DIAGNOSIS — G894 Chronic pain syndrome: Secondary | ICD-10-CM | POA: Diagnosis present

## 2021-11-08 DIAGNOSIS — Z832 Family history of diseases of the blood and blood-forming organs and certain disorders involving the immune mechanism: Secondary | ICD-10-CM | POA: Diagnosis not present

## 2021-11-08 DIAGNOSIS — E559 Vitamin D deficiency, unspecified: Secondary | ICD-10-CM | POA: Diagnosis present

## 2021-11-08 DIAGNOSIS — Z6841 Body Mass Index (BMI) 40.0 and over, adult: Secondary | ICD-10-CM | POA: Diagnosis not present

## 2021-11-08 DIAGNOSIS — Z79899 Other long term (current) drug therapy: Secondary | ICD-10-CM | POA: Diagnosis not present

## 2021-11-08 DIAGNOSIS — D638 Anemia in other chronic diseases classified elsewhere: Secondary | ICD-10-CM | POA: Diagnosis present

## 2021-11-08 DIAGNOSIS — D5700 Hb-SS disease with crisis, unspecified: Secondary | ICD-10-CM | POA: Diagnosis present

## 2021-11-08 NOTE — TOC Initial Note (Addendum)
Transition of Care [facility]) - Initial/Assessment Note    Patient Details  Name: Stephanie Burch MRN: [id number] Date of Birth: 01/18/1981  Transition of Care [facility]) CM/SW Contact:    Golda Acre, RN Phone Number: 11/08/2021, 8:42 AM  Clinical Narrative:                  Transition of Care [facility]) Screening Note   Patient Details  Name: Stephanie Burch Date of Birth: 06/08/1980   Transition of Care [facility]) CM/SW Contact:    Golda Acre, RN Phone Number: 11/08/2021, 8:56 AM    Transition of Care Department [facility]) has reviewed patient and no TOC needs have been identified at this time. We will continue to monitor patient advancement through interdisciplinary progression rounds. If new patient transition needs arise, please place a TOC consult.    Expected Discharge Plan: Home/Self Care Barriers to Discharge: Continued Medical Work up   Patient Goals and CMS Choice Patient states their goals for this hospitalization and ongoing recovery are:: to go home CMS Medicare.gov Compare Post Acute Care list provided to:: Patient Choice offered to / list presented to : Patient  Expected Discharge Plan and Services Expected Discharge Plan: Home/Self Care   Discharge Planning Services: CM Consult   Living arrangements for the past 2 months: Single Family Home                                      Prior Living Arrangements/Services Living arrangements for the past 2 months: Single Family Home Lives with:: Self Patient language and need for interpreter reviewed:: Yes Do you feel safe going back to the place where you live?: Yes            Criminal Activity/Legal Involvement Pertinent to Current Situation/Hospitalization: No - Comment as needed  Activities of Daily Living      Permission Sought/Granted                  Emotional Assessment Appearance:: Appears stated age Attitude/Demeanor/Rapport: Engaged Affect (typically observed): Calm Orientation:  : Oriented to Self, Oriented to Place, Oriented to  Time, Oriented to Situation Alcohol / Substance Use: Never Used Psych Involvement: No (comment)  Admission diagnosis:  Sickle cell pain crisis (HCC) [D57.00] Patient Active Problem List   Diagnosis Date Noted   Sickle cell disease with crisis (HCC) 09/24/2020   Sickle cell anemia with crisis (HCC) 06/12/2020   Pulmonary edema 11/11/2019   Hypoxia    Poor venous access 11/09/2019   HCAP (healthcare-associated pneumonia) 11/07/2019   Sepsis (HCC) 11/07/2019   Acute kidney injury (HCC) 11/07/2019   History of COVID-19 11/07/2019   COVID-19 virus detected 10/22/2019   Sore throat due to virus 10/22/2019   Urinary frequency 04/20/2019   Vaginal odor 04/20/2019   Chest pain varying with breathing    Fever and chills    Vitamin D deficiency 11/15/2018   Nausea 11/15/2018   Abnormal pulse oximetry    Hypokalemia 09/20/2018   Medication management 09/14/2018   Muscle spasms of both lower extremities 08/25/2018   Subjective visual disturbance of right eye 08/25/2018   Hb-SS disease without crisis (HCC) 04/13/2018   Chronic pain syndrome 03/16/2018   Chronic, continuous use of opioids 03/16/2018   Tachycardia with heart rate 100-120 beats per minute    Sickle cell crisis (HCC) 10/19/2017   Leukocytosis 10/19/2017   Thrombocytopenia (HCC) 10/19/2017   Morbid  obesity with body mass index (BMI) of 45.0 to 49.9 in adult [facility]) 10/19/2017   Sickle cell pain crisis ([place]) 10/15/2017   PCP:  Pcp, No Pharmacy:   [id number] [address], [address] [place] [address] Phone: [phone] Fax: [phone]     Social Determinants of Health (SDOH) Interventions    Readmission Risk Interventions   Row Labels 12/19/2020   12:15 PM 08/14/2020   10:14 AM 07/17/2020   11:13 AM  Readmission Risk Prevention Plan   Section Header. No data exists in this row.     Transportation Screening    Complete Complete Complete  PCP or Specialist Appt within 3-5 Days    Complete   HRI or Home Care Consult    Complete Complete  Social Work Consult for [place] Planning/Counseling    Complete Complete  Palliative Care Screening    Not Applicable   Medication Review [employer])   Complete Complete Complete  HRI or Home Care Consult   Complete    SW Recovery Care/Counseling Consult   Complete    Palliative Care Screening   Not [place]   Not Applicable

## 2021-11-08 NOTE — Progress Notes (Signed)
Subjective: Stephanie Burch is a 41 year old female with a medical history significant for sickle cell disease, chronic pain syndrome, opiate dependence and tolerance, morbid obesity, and history of anemia of chronic disease was admitted for sickle cell pain crisis. Patient states that pain intensity has not improved very much today.  She rates pain as 8/10 primarily to her lower extremities.  She denies headache, chest pain, shortness of breath, urinary symptoms, nausea, vomiting, or diarrhea.  Objective:  Vital signs in last 24 hours:  Vitals:   11/08/21 0032 11/08/21 0158 11/08/21 0428 11/08/21 0835  BP:  120/73    Pulse:  97    Resp: 18 18 18 17   Temp:  98.3 F (36.8 C)    TempSrc:  Oral    SpO2: 100% 100% 97% 96%  Weight:      Height:        Intake/Output from previous day:   Intake/Output Summary (Last 24 hours) at 11/08/2021 1032 Last data filed at 11/08/2021 0431 Gross per 24 hour  Intake 1946.25 ml  Output --  Net 1946.25 ml    Physical Exam: General: Alert, awake, oriented x3, in no acute distress.  HEENT: [place]/AT PEERL, EOMI Neck: Trachea midline,  no masses, no thyromegal,y no JVD, no carotid bruit OROPHARYNX:  Moist, No exudate/ erythema/lesions.  Heart: Regular rate and rhythm, without murmurs, rubs, gallops, PMI non-displaced, no heaves or thrills on palpation.  Lungs: Clear to auscultation, no wheezing or rhonchi noted. No increased vocal fremitus resonant to percussion  Abdomen: Soft, nontender, nondistended, positive bowel sounds, no masses no hepatosplenomegaly noted..  Neuro: No focal neurological deficits noted cranial nerves II through XII grossly intact. DTRs 2+ bilaterally upper and lower extremities. Strength 5 out of 5 in bilateral upper and lower extremities. Musculoskeletal: No warm swelling or erythema around joints, no spinal tenderness noted. Psychiatric: Patient alert and oriented x3, good insight and cognition, good recent to remote recall. Lymph  node survey: No cervical axillary or inguinal lymphadenopathy noted.  Lab Results:  Basic Metabolic Panel:    Component Value Date/Time   NA 140 11/07/2021 0849   NA 142 09/11/2017 1431   K 4.1 11/07/2021 0849   CL 111 11/07/2021 0849   CO2 21 (L) 11/07/2021 0849   BUN 9 11/07/2021 0849   BUN 8 09/11/2017 1431   CREATININE 0.56 11/07/2021 0849   GLUCOSE 122 (H) 11/07/2021 0849   CALCIUM 9.1 11/07/2021 0849   CBC:    Component Value Date/Time   WBC 8.2 11/07/2021 0943   HGB 11.8 (L) 11/07/2021 0943   HGB 13.0 09/11/2017 1431   HCT 34.6 (L) 11/07/2021 0943   HCT 38.4 09/11/2017 1431   PLT 198 11/07/2021 0943   PLT 258 09/11/2017 1431   MCV 70.0 (L) 11/07/2021 0943   MCV 75 (L) 09/11/2017 1431   NEUTROABS 4.8 11/07/2021 0943   NEUTROABS 3.9 09/11/2017 1431   LYMPHSABS 2.3 11/07/2021 0943   LYMPHSABS 2.2 09/11/2017 1431   MONOABS 0.7 11/07/2021 0943   EOSABS 0.3 11/07/2021 0943   EOSABS 0.1 09/11/2017 1431   BASOSABS 0.0 11/07/2021 0943   BASOSABS 0.0 09/11/2017 1431    No results found for this or any previous visit (from the past 240 hour(s)).  Studies/Results: No results found.  Medications: Scheduled Meds:  enoxaparin (LOVENOX) injection  40 mg Subcutaneous Q24H   folic acid  1 mg Oral Daily   HYDROmorphone   Intravenous Q4H   ketorolac  15 mg Intravenous Q6H   oxyCODONE  30 mg Oral Q12H   senna-docusate  1 tablet Oral BID   voxelotor  1,500 mg Oral Q1500   Continuous Infusions:  sodium chloride 100 mL/hr at 11/08/21 0840   PRN Meds:.diphenhydrAMINE, naloxone **AND** sodium chloride flush, ondansetron, ondansetron (ZOFRAN) IV, polyethylene glycol  Consultants: none  Procedures: none  Antibiotics: none  Assessment/Plan: Principal Problem:   Sickle cell pain crisis ([place]) Active Problems:   Morbid obesity with body mass index (BMI) of 45.0 to 49.9 in adult [facility])   Chronic pain syndrome  Sickle cell disease with pain crisis: Continue IV  Dilaudid PCA without any changes Toradol 15 mg every 6 hours OxyContin 30 mg every 12 hours Monitor vital signs very closely, reevaluate pain scale regularly, and supplemental oxygen as needed Patient encouraged to ambulate in halls in preparation for discharge 11/09/2021  Chronic pain syndrome: Continue home medications  Anemia of chronic disease: Hemoglobin is stable and consistent with baseline.  There is no clinical indication for blood transfusion at this time.  Monitor closely.  Morbid obesity: Patient counseled at length on the importance of following a low-fat, low-carb diet.  Continue heart healthy diet.  Code Status: Full Code Family Communication: N/A Disposition Plan: Not yet ready for discharge.  Discharge plan for 11/09/2021  [name]  APRN, MSN, FNP-C Patient [facility] [place], [place] [address] [phone]  If 7PM-7AM, please contact night-coverage.  11/08/2021, 10:32 AM  LOS: 0 days

## 2021-11-09 ENCOUNTER — Other Ambulatory Visit: Payer: Self-pay

## 2021-11-09 DIAGNOSIS — D5700 Hb-SS disease with crisis, unspecified: Secondary | ICD-10-CM | POA: Diagnosis not present

## 2021-11-09 LAB — HIV ANTIBODY (ROUTINE TESTING W REFLEX): HIV Screen 4th Generation wRfx: NONREACTIVE

## 2021-11-09 MED ORDER — VALACYCLOVIR HCL 500 MG PO TABS
500.0000 mg | ORAL_TABLET | Freq: Every day | ORAL | Status: DC
  Administered 2021-11-09 – 2021-11-11 (×3): 500 mg via ORAL
  Filled 2021-11-09 (×3): qty 1

## 2021-11-09 MED ORDER — OXYCODONE HCL 5 MG PO TABS
30.0000 mg | ORAL_TABLET | ORAL | Status: DC | PRN
  Administered 2021-11-09 – 2021-11-11 (×9): 30 mg via ORAL
  Filled 2021-11-09 (×9): qty 6

## 2021-11-09 NOTE — Progress Notes (Signed)
SICKLE CELL SERVICE PROGRESS NOTE  Stephanie Burch [id number] DOB: 07-05-1980 DOA: 11/07/2021 PCP: Pcp, No  Assessment/Plan: Principal Problem:   Sickle cell pain crisis ([place]) Active Problems:   Morbid obesity with body mass index (BMI) of 45.0 to 49.9 in adult [facility])   Chronic pain syndrome  Sickle cell pain crisis: Patient is currently on Dilaudid PCA with home regimen.  Pain is still at 7 out of 10.  She is asking for her home regimen of short acting pain medications.  I will resume oxycodone 30 mg every 4 hours as needed for breakthrough pain in addition to the Dilaudid PCA History of herpes labialis: Also complaining of some early breakdown of her known herpes labialis.  She usually takes suppressive therapy with Valtrex.   Chronic pain syndrome: She has chronic pain syndrome and has had continued pain. Morbid obesity: Dietary counseling.  Code Status: Full code Family Communication: No family at bedside Disposition Plan: [place]  Pager [phone]. If 7PM-7AM, please contact night-coverage.  11/09/2021, 11:33 AM  LOS: 1 day   Brief narrative: Also complaining of some early breakdown of her known herpes labialis.  She usually takes suppressive therapy with Valtrex.  She has chronic pain syndrome and has had continued pain.  Consultants: None  Procedures: None  Antibiotics: None  HPI/Subjective: Patient is complaining of 6 out of 10 pain at the moment.  No fever or chills no nausea vomiting or diarrhea  Objective: Vitals:   11/09/21 0459 11/09/21 0622 11/09/21 0829 11/09/21 1056  BP:  120/73 (!) 143/74 (!) 114/51  Pulse:  (!) 119 (!) 105 (!) 113  Resp: 19 18 16 18   Temp:  99.6 F (37.6 C) 98.3 F (36.8 C) 98.4 F (36.9 C)  TempSrc:  Oral Oral Oral  SpO2: 91% 94% 97% 90%  Weight:      Height:       Weight change:   Intake/Output Summary (Last 24 hours) at 11/09/2021 1133 Last data filed at 11/09/2021 0819 Gross per 24 hour  Intake 1351.88 ml   Output --  Net 1351.88 ml    General: Alert, awake, oriented x3, in no acute distress.  HEENT: [place]/AT PEERL, EOMI Neck: Trachea midline,  no masses, no thyromegal,y no JVD, no carotid bruit OROPHARYNX:  Moist, No exudate/ erythema/lesions.  Heart: Regular rate and rhythm, without murmurs, rubs, gallops, PMI non-displaced, no heaves or thrills on palpation.  Lungs: Clear to auscultation, no wheezing or rhonchi noted. No increased vocal fremitus resonant to percussion  Abdomen: Soft, nontender, nondistended, positive bowel sounds, no masses no hepatosplenomegaly noted..  Neuro: No focal neurological deficits noted cranial nerves II through XII grossly intact. DTRs 2+ bilaterally upper and lower extremities. Strength 5 out of 5 in bilateral upper and lower extremities. Musculoskeletal: No warm swelling or erythema around joints, no spinal tenderness noted. Psychiatric: Patient alert and oriented x3, good insight and cognition, good recent to remote recall. Lymph node survey: No cervical axillary or inguinal lymphadenopathy noted.   Data Reviewed: Basic Metabolic Panel: Recent Labs  Lab 11/07/21 0849  NA 140  K 4.1  CL 111  CO2 21*  GLUCOSE 122*  BUN 9  CREATININE 0.56  CALCIUM 9.1   Liver Function Tests: Recent Labs  Lab 11/07/21 0849  AST 26  ALT 39  ALKPHOS 71  BILITOT 0.7  PROT 7.3  ALBUMIN 3.7   No results for input(s): "LIPASE", "AMYLASE" in the last 168 hours. No results for input(s): "AMMONIA" in the last  168 hours. CBC: Recent Labs  Lab 11/07/21 0943  WBC 8.2  NEUTROABS 4.8  HGB 11.8*  HCT 34.6*  MCV 70.0*  PLT 198   Cardiac Enzymes: No results for input(s): "CKTOTAL", "CKMB", "CKMBINDEX", "TROPONINI" in the last 168 hours. BNP (last 3 results) No results for input(s): "BNP" in the last 8760 hours.  ProBNP (last 3 results) No results for input(s): "PROBNP" in the last 8760 hours.  CBG: No results for input(s): "GLUCAP" in the last 168 hours.  No  results found for this or any previous visit (from the past 240 hour(s)).   Studies: No results found.  Scheduled Meds:  enoxaparin (LOVENOX) injection  40 mg Subcutaneous W29H   folic acid  1 mg Oral Daily   HYDROmorphone   Intravenous Q4H   ketorolac  15 mg Intravenous Q6H   oxyCODONE  30 mg Oral Q12H   senna-docusate  1 tablet Oral BID   valACYclovir  500 mg Oral Daily   voxelotor  1,500 mg Oral Q1500   Continuous Infusions:  sodium chloride 10 mL/hr at 11/09/21 0800    Principal Problem:   Sickle cell pain crisis [facility]) Active Problems:   Morbid obesity with body mass index (BMI) of 45.0 to 49.9 in adult [facility])   Chronic pain syndrome

## 2021-11-10 DIAGNOSIS — D5700 Hb-SS disease with crisis, unspecified: Secondary | ICD-10-CM | POA: Diagnosis not present

## 2021-11-10 NOTE — Progress Notes (Signed)
SICKLE CELL SERVICE PROGRESS NOTE  Stephanie Burch UXN:[id number] DOB: [birth year] DOA: 11/07/2021 PCP: Pcp, No  Assessment/Plan: Principal Problem:   Sickle cell pain crisis (HCC) Active Problems:   Morbid obesity with body mass index (BMI) of 45.0 to 49.9 in adult [facility])   Chronic pain syndrome  Sickle cell pain crisis: Patient is currently on Dilaudid PCA with home regimen.  Pain is at 6 out of 10.  She is asking for her home regimen of short acting pain medications.  I will resume oxycodone 30 mg every 4 hours as needed for breakthrough pain in addition to the Dilaudid PCA History of herpes labialis: Continue treatment with Valtrex.   Chronic pain syndrome: She has chronic pain syndrome and has had continued pain. Morbid obesity: Dietary counseling.  Code Status: Full code Family Communication: No family at bedside Disposition Plan: Home  [facility]  Pager [phone]. If 7PM-7AM, please contact night-coverage.  11/10/2021, 11:36 PM  LOS: 2 days   Brief narrative: Patient feels like her pain is improving.  She is close to being discharged possibly in the next day or so.  Consultants: None  Procedures: None  Antibiotics: None  HPI/Subjective: Patient is complaining of 6 out of 10 pain at the moment.  No fever or chills no nausea vomiting or diarrhea  Objective: Vitals:   11/10/21 1519 11/10/21 1908 11/10/21 2059 11/10/21 2325  BP:   117/72   Pulse:   95   Resp: 16 16 14 20   Temp:   98.1 F (36.7 C)   TempSrc:   Oral   SpO2:  93% 95% 93%  Weight:      Height:       Weight change:   Intake/Output Summary (Last 24 hours) at 11/10/2021 2336 Last data filed at 11/10/2021 1600 Gross per 24 hour  Intake 182.97 ml  Output --  Net 182.97 ml     General: Alert, awake, oriented x3, in no acute distress.  HEENT: [place]/AT PEERL, EOMI Neck: Trachea midline,  no masses, no thyromegal,y no JVD, no carotid bruit OROPHARYNX:  Moist, No exudate/ erythema/lesions.  Heart:  Regular rate and rhythm, without murmurs, rubs, gallops, PMI non-displaced, no heaves or thrills on palpation.  Lungs: Clear to auscultation, no wheezing or rhonchi noted. No increased vocal fremitus resonant to percussion  Abdomen: Soft, nontender, nondistended, positive bowel sounds, no masses no hepatosplenomegaly noted..  Neuro: No focal neurological deficits noted cranial nerves II through XII grossly intact. DTRs 2+ bilaterally upper and lower extremities. Strength 5 out of 5 in bilateral upper and lower extremities. Musculoskeletal: No warm swelling or erythema around joints, no spinal tenderness noted. Psychiatric: Patient alert and oriented x3, good insight and cognition, good recent to remote recall. Lymph node survey: No cervical axillary or inguinal lymphadenopathy noted.   Data Reviewed: Basic Metabolic Panel: Recent Labs  Lab 11/07/21 0849  NA 140  K 4.1  CL 111  CO2 21*  GLUCOSE 122*  BUN 9  CREATININE 0.56  CALCIUM 9.1    Liver Function Tests: Recent Labs  Lab 11/07/21 0849  AST 26  ALT 39  ALKPHOS 71  BILITOT 0.7  PROT 7.3  ALBUMIN 3.7    No results for input(s): "LIPASE", "AMYLASE" in the last 168 hours. No results for input(s): "AMMONIA" in the last 168 hours. CBC: Recent Labs  Lab 11/07/21 0943  WBC 8.2  NEUTROABS 4.8  HGB 11.8*  HCT 34.6*  MCV 70.0*  PLT 198    Cardiac Enzymes: No  results for input(s): "CKTOTAL", "CKMB", "CKMBINDEX", "TROPONINI" in the last 168 hours. BNP (last 3 results) No results for input(s): "BNP" in the last 8760 hours.  ProBNP (last 3 results) No results for input(s): "PROBNP" in the last 8760 hours.  CBG: No results for input(s): "GLUCAP" in the last 168 hours.  No results found for this or any previous visit (from the past 240 hour(s)).   Studies: No results found.  Scheduled Meds:  enoxaparin (LOVENOX) injection  40 mg Subcutaneous G86P   folic acid  1 mg Oral Daily   HYDROmorphone   Intravenous Q4H    ketorolac  15 mg Intravenous Q6H   oxyCODONE  30 mg Oral Q12H   senna-docusate  1 tablet Oral BID   valACYclovir  500 mg Oral Daily   voxelotor  1,500 mg Oral Q1500   Continuous Infusions:  sodium chloride 10 mL/hr at 11/10/21 1600    Principal Problem:   Sickle cell pain crisis ([place]) Active Problems:   Morbid obesity with body mass index (BMI) of 45.0 to 49.9 in adult [facility])   Chronic pain syndrome

## 2021-11-11 NOTE — Discharge Summary (Signed)
Physician Discharge Summary   Patient: Stephanie Burch MRN: [id number] DOB: Stephanie Burch  Admit date:     11/07/2021  Discharge date: 11/11/2021  Discharge Physician: Lonia Blood   PCP: Pcp, No   Recommendations at discharge:   Patient to continue home regimen.  Prescriptions given  Discharge Diagnoses: Active Problems:   Morbid obesity with body mass index (BMI) of 45.0 to 49.9 in adult [facility])   Chronic pain syndrome  Principal Problem (Resolved):   Sickle cell pain crisis [facility])  Hospital Course: Moreen Burch  is a 41 y.o. female with a medical history significant for sickle cell disease, chronic pain syndrome, opiate dependence, and tolerance, and morbid obesity presents to the emergency department with complaints of lower extremity pain. Patient  says that lower extremity pain has been increasing over the past 24 hours and has been unrelieved by home medications. Patient's home medications include Xtampza and dilaudid. Opiate medications are managed by patient's hematology team at [facility]. She rates lower extremity pain at 10/10, constant and sharp. She denies fever, chills, chest pain, or shortness of breath. No urinary symptoms, nausea, vomiting, or diarrhea. No sick contacts, recent travel, or exposure to COVID 19.  Patient was admitted and treated for sickle cell crisis.  Pain management mildly.  Continue with Xtampza substituted.  Also Dilaudid as needed.  NSAIDs also added.  Patient continued to have shortness of breath but got better.  At this point she is being stable and discharged home on home regimen.       Pain control - [employer] Controlled Substance Reporting System database was reviewed. and patient was instructed, not to drive, operate heavy machinery, perform activities at heights, swimming or participation in water activities or provide baby-sitting services while on Pain, Sleep and Anxiety Medications; until their outpatient Physician has advised to do  so again. Also recommended to not to take more than prescribed Pain, Sleep and Anxiety Medications.   Consultants: None Procedures performed: None Disposition: Home Diet recommendation:  Discharge Diet Orders (From admission, onward)     Start     Ordered   11/11/21 0000  Diet - low sodium heart healthy        11/11/21 1057           Regular diet DISCHARGE MEDICATION: Allergies as of 11/11/2021   No Known Allergies      Medication List     STOP taking these medications    amoxicillin 500 MG capsule Commonly known as: AMOXIL       TAKE these medications    acetaminophen 500 MG tablet Commonly known as: TYLENOL Take 1,000 mg by mouth every 6 (six) hours as needed for mild pain, moderate pain or headache.   folic acid 1 MG tablet Commonly known as: FOLVITE Take 1 mg by mouth daily.   HYDROmorphone 4 MG tablet Commonly known as: DILAUDID Take 4 mg by mouth every 6 (six) hours as needed for severe pain.   ibuprofen 200 MG tablet Commonly known as: ADVIL Take 800 mg by mouth 3 (three) times daily as needed for fever, headache or mild pain.   Oxbryta 500 MG Tabs tablet Generic drug: voxelotor Take 1,500 mg by mouth daily in the afternoon.   oxycodone 30 MG immediate release tablet Commonly known as: ROXICODONE Take 30 mg by mouth every 4 (four) hours as needed for pain.        Discharge Exam: Filed Weights   11/07/21 0826  Weight: (!) 145.2 kg  Constitutional: NAD, calm, comfortable Eyes: PERRL, lids and conjunctivae normal ENMT: Mucous membranes are moist. Posterior pharynx clear of any exudate or lesions.Normal dentition.  Neck: normal, supple, no masses, no thyromegaly Respiratory: clear to auscultation bilaterally, no wheezing, no crackles. Normal respiratory effort. No accessory muscle use.  Cardiovascular: Regular rate and rhythm, no murmurs / rubs / gallops. No extremity edema. 2+ pedal pulses. No carotid bruits.  Abdomen: no tenderness, no  masses palpated. No hepatosplenomegaly. Bowel sounds positive.  Musculoskeletal: Good range of motion, no joint swelling or tenderness, Skin: no rashes, lesions, ulcers. No induration Neurologic: CN 2-12 grossly intact. Sensation intact, DTR normal. Strength 5/5 in all 4.  Psychiatric: Normal judgment and insight. Alert and oriented x 3. Normal mood   Condition at discharge: good  The results of significant diagnostics from this hospitalization (including imaging, microbiology, ancillary and laboratory) are listed below for reference.   Imaging Studies: ECHOCARDIOGRAM COMPLETE  Result Date: 11/22/2021    ECHOCARDIOGRAM REPORT   [facility] Date of Exam: 11/22/2021 Medical Rec #:  [id number]     Height:       65.0 in Accession #:    [id number]    Weight:       320.0 lb Date of Birth:  [birth year]    BSA:          2.415 m Patient Age:    40 years      BP:           134/62 mmHg Patient Gender: F             HR:           82 bpm. Exam Location:  Inpatient Procedure: 2D Echo, Color Doppler and Cardiac Doppler Indications:    Pulmonary Hypertension  History:        Patient has prior history of Echocardiogram examinations, most                 recent 11/15/2019. Arrythmias:Tachycardia.  Sonographer:    Stephanie Burch Referring Phys: [id number] ALEXANDER B MELVIN IMPRESSIONS  1. Left ventricular ejection fraction, by estimation, is 60 to 65%. The left ventricle has normal function. The left ventricle has no regional wall motion abnormalities. Left ventricular diastolic parameters are indeterminate.  2. Right ventricular systolic function is normal. The right ventricular size is normal.  3. The mitral valve is normal in structure. Trivial mitral valve regurgitation.  4. The aortic valve is tricuspid. Aortic valve regurgitation is not visualized. Aortic valve sclerosis/calcification is present, without any evidence of aortic stenosis. FINDINGS  Left Ventricle: Left ventricular ejection fraction,  by estimation, is 60 to 65%. The left ventricle has normal function. The left ventricle has no regional wall motion abnormalities. The left ventricular internal cavity size was normal in size. There is  no left ventricular hypertrophy. Left ventricular diastolic parameters are indeterminate. Right Ventricle: The right ventricular size is normal. No increase in right ventricular wall thickness. Right ventricular systolic function is normal. Left Atrium: Left atrial size was normal in size. Right Atrium: Right atrial size was normal in size. Pericardium: There is no evidence of pericardial effusion. Mitral Valve: The mitral valve is normal in structure. Trivial mitral valve regurgitation. Tricuspid Valve: The tricuspid valve is normal in structure. Tricuspid valve regurgitation is trivial. Aortic Valve: The aortic valve is tricuspid. Aortic valve regurgitation is not visualized. Aortic valve sclerosis/calcification is present, without any evidence of aortic stenosis. Aortic valve mean gradient measures 5.0 mmHg. Aortic  valve peak gradient measures 9.6 mmHg. Aortic valve area, by VTI measures 4.32 cm. Pulmonic Valve: The pulmonic valve was not well visualized. Pulmonic valve regurgitation is not visualized. No evidence of pulmonic stenosis. Aorta: The aortic root is normal in size and structure. IAS/Shunts: No atrial level shunt detected by color flow Doppler.  LEFT VENTRICLE PLAX 2D LVIDd:         4.80 cm   Diastology LVIDs:         3.10 cm   LV e' medial:    6.09 cm/s LV PW:         1.10 cm   LV E/e' medial:  17.4 LV IVS:        1.00 cm   LV e' lateral:   5.66 cm/s LVOT diam:     2.80 cm   LV E/e' lateral: 18.7 LV SV:         106 LV SV Index:   44 LVOT Area:     6.16 cm  RIGHT VENTRICLE TAPSE (M-mode): 2.3 cm LEFT ATRIUM             Index        RIGHT ATRIUM           Index LA diam:        4.00 cm 1.66 cm/m   RA Area:     12.00 cm LA Vol (A2C):   76.7 ml 31.75 ml/m  RA Volume:   24.30 ml  10.06 ml/m LA Vol  (A4C):   57.4 ml 23.76 ml/m LA Biplane Vol: 67.7 ml 28.03 ml/m  AORTIC VALVE AV Area (Vmax):    3.96 cm AV Area (Vmean):   3.99 cm AV Area (VTI):     4.32 cm AV Vmax:           155.00 cm/s AV Vmean:          97.700 cm/s AV VTI:            0.245 m AV Peak Grad:      9.6 mmHg AV Mean Grad:      5.0 mmHg LVOT Vmax:         99.70 cm/s LVOT Vmean:        63.300 cm/s LVOT VTI:          0.172 m LVOT/AV VTI ratio: 0.70  AORTA Ao Root diam: 3.50 cm Ao Asc diam:  3.00 cm MITRAL VALVE MV Area (PHT): 4.06 cm     SHUNTS MV Decel Time: 187 msec     Systemic VTI:  0.17 m MV E velocity: 106.00 cm/s  Systemic Diam: 2.80 cm MV A velocity: 108.00 cm/s MV E/A ratio:  0.98 Dietrich Pates MD Electronically signed by Dietrich Pates MD Signature Date/Time: 11/22/2021/4:03:46 PM    Final    DG Chest Portable 1 View  Result Date: 11/21/2021 CLINICAL DATA:  Chest pain EXAM: PORTABLE CHEST 1 VIEW COMPARISON:  04/02/21 FINDINGS: No pleural effusion. No pneumothorax. Cardiomegaly. No focal airspace opacity. Prominent interstitial opacities, could be seen in the setting of pulmonary venous congestion without overt pulmonary edema. No displaced rib fractures. Visualized upper abdomen is unremarkable. IMPRESSION: 1. No radiographic finding to explain chest pain. 2. Cardiomegaly with pulmonary venous congestion. No overt pulmonary edema. No focal airspace opacity. Electronically Signed   By: Lorenza Cambridge M.D.   On: 11/21/2021 16:13   CT Angio Chest PE W and/or Wo Contrast  Result Date: 11/21/2021 CLINICAL DATA:  Chest pain, pulmonary embolism suspected. EXAM:  CT ANGIOGRAPHY CHEST WITH CONTRAST TECHNIQUE: Multidetector CT imaging of the chest was performed using the standard protocol during bolus administration of intravenous contrast. Multiplanar CT image reconstructions and MIPs were obtained to evaluate the vascular anatomy. RADIATION DOSE REDUCTION: This exam was performed according to the departmental dose-optimization program which  includes automated exposure control, adjustment of the mA and/or kV according to patient size and/or use of iterative reconstruction technique. CONTRAST:  127mL OMNIPAQUE IOHEXOL 350 MG/ML SOLN COMPARISON:  CT examination dated April 02, 2021 FINDINGS: Cardiovascular: Satisfactory opacification of the pulmonary arteries to the segmental level. No evidence of pulmonary embolism. Evaluation of distal segmental and subsegmental pulmonary arteries is limited due to phase of contrast enhancement and respiratory motion. Heart is mildly enlarged. Main pulmonary trunk is dilated measuring up to 3.2 cm concerning for pulmonary arterial hypertension. No pericardial effusion. Mediastinum/Nodes: No enlarged mediastinal, hilar, or axillary lymph nodes. Thyroid gland, trachea, and esophagus demonstrate no significant findings. Lungs/Pleura: No focal consolidation or pleural effusion. Low lung volumes. Bibasilar dependent atelectasis. 6 mm nodule in the superior aspect of the right middle lobe (series 6, image 58). Upper Abdomen: No acute abnormality. Musculoskeletal: No chest wall abnormality. No acute or significant osseous findings. Review of the MIP images confirms the above findings. IMPRESSION: 1. No evidence of central pulmonary embolism. Evaluation of distal segmental and subsegmental pulmonary arteries is limited due to phase of contrast enhancement and respiratory motion. 2. Mild cardiomegaly. 3. Main pulmonary trunk is dilated measuring up to 3.2 cm concerning for pulmonary arterial hypertension. 4. Low lung volumes with bibasilar dependent atelectasis. No focal consolidation or pleural effusion. 5. 6 mm nodule in the superior aspect of the right middle lobe, unchanged. Electronically Signed   By: Keane Police D.O.   On: 11/21/2021 15:44    Microbiology: Results for orders placed or performed during the hospital encounter of 04/11/21  Resp Panel by RT-PCR (Flu A&B, Covid) Nasopharyngeal Swab     Status: None    Collection Time: 04/11/21 11:58 AM   Specimen: Nasopharyngeal Swab; Nasopharyngeal(NP) swabs in vial transport medium  Result Value Ref Range Status   SARS Coronavirus 2 by RT PCR NEGATIVE NEGATIVE Final    Comment: (NOTE) SARS-CoV-2 target nucleic acids are NOT DETECTED.  The SARS-CoV-2 RNA is generally detectable in upper respiratory specimens during the acute phase of infection. The lowest concentration of SARS-CoV-2 viral copies this assay can detect is 138 copies/mL. A negative result does not preclude SARS-Cov-2 infection and should not be used as the sole basis for treatment or other patient management decisions. A negative result Stephanie occur with  improper specimen collection/handling, submission of specimen other than nasopharyngeal swab, presence of viral mutation(s) within the areas targeted by this assay, and inadequate number of viral copies(<138 copies/mL). A negative result must be combined with clinical observations, patient history, and epidemiological information. The expected result is Negative.  Fact Sheet for Patients:  EntrepreneurPulse.com.au  Fact Sheet for Healthcare Providers:  IncredibleEmployment.be  This test is no t yet approved or cleared by the [place] FDA and  has been authorized for detection and/or diagnosis of SARS-CoV-2 by FDA under an Emergency Use Authorization (EUA). This EUA will remain  in effect (meaning this test can be used) for the duration of the COVID-19 declaration under Section 564(b)(1) of the Act, 21 U.S.C.section 360bbb-3(b)(1), unless the authorization is terminated  or revoked sooner.       Influenza A by PCR NEGATIVE NEGATIVE Final   Influenza B by  PCR NEGATIVE NEGATIVE Final    Comment: (NOTE) The Xpert Xpress SARS-CoV-2/FLU/RSV plus assay is intended as an aid in the diagnosis of influenza from Nasopharyngeal swab specimens and should not be used as a sole basis for treatment.  Nasal washings and aspirates are unacceptable for Xpert Xpress SARS-CoV-2/FLU/RSV testing.  Fact Sheet for Patients: BloggerCourse.com  Fact Sheet for Healthcare Providers: SeriousBroker.it  This test is not yet approved or cleared by the [place] FDA and has been authorized for detection and/or diagnosis of SARS-CoV-2 by FDA under an Emergency Use Authorization (EUA). This EUA will remain in effect (meaning this test can be used) for the duration of the COVID-19 declaration under Section 564(b)(1) of the Act, 21 U.S.C. section 360bbb-3(b)(1), unless the authorization is terminated or revoked.  Performed at [facility], [address], [place], [place] [address]     Labs: CBC: Recent Labs  Lab 11/21/21 1406  WBC 11.2*  HGB 12.7  HCT 36.9  MCV 72.1*  PLT 307   Basic Metabolic Panel: Recent Labs  Lab 11/21/21 1406  NA 141  K 4.0  CL 111  CO2 22  GLUCOSE 108*  BUN 9  CREATININE 0.67  CALCIUM 9.4   Liver Function Tests: No results for input(s): "AST", "ALT", "ALKPHOS", "BILITOT", "PROT", "ALBUMIN" in the last 168 hours.  CBG: No results for input(s): "GLUCAP" in the last 168 hours.  Discharge time spent: greater than 30 minutes.  SignedLonia Blood, MD [facility] 11/24/2021

## 2021-11-11 NOTE — Care Management Important Message (Signed)
Important Message  Patient Details IM Letter given to the Patient. Name: Deairra Halleck MRN: [id number] Date of Birth: 10-30-1980   Medicare Important Message Given:  Yes     Kerin Salen 11/11/2021, 9:47 AM

## 2021-11-11 NOTE — TOC Transition Note (Signed)
Transition of Care [facility]) - CM/SW Discharge Note   Patient Details  Name: Stephanie Burch MRN: [id number] Date of Birth: 1980-02-23  Transition of Care [facility]) CM/SW Contact:  Leeroy Cha, RN Phone Number: 11/11/2021, 11:09 AM   Clinical Narrative:    [id number] discharged to return home.  Chart reviewed for TOC needs.  None found.  Patient self care.   Final next level of care: Home/Self Care Barriers to Discharge: Barriers Resolved   Patient Goals and CMS Choice Patient states their goals for this hospitalization and ongoing recovery are:: to go home CMS Medicare.gov Compare Post Acute Care list provided to:: Patient Choice offered to / list presented to : Patient  Discharge Placement                       Discharge Plan and Services   Discharge Planning Services: CM Consult                                 Social Determinants of Health (SDOH) Interventions     Readmission Risk Interventions   Row Labels 12/19/2020   12:15 PM 08/14/2020   10:14 AM 07/17/2020   11:13 AM  Readmission Risk Prevention Plan   Section Header. No data exists in this row.     Transportation Screening   Complete Complete Complete  PCP or Specialist Appt within 3-5 Days    Complete   HRI or Home Care Consult    Complete Complete  Social Work Consult for [place] Planning/Counseling    Complete Complete  Palliative Care Screening    Not Applicable   Medication Review [employer])   Complete Complete Complete  HRI or Home Care Consult   Complete    SW Recovery Care/Counseling Consult   Complete    Palliative Care Screening   Not [place]   Not Applicable

## 2021-11-11 NOTE — TOC Progression Note (Signed)
Transition of Care [facility]) - Progression Note    Patient Details  Name: Stephanie Burch MRN: [id number] Date of Birth: Aug 20, 1980  Transition of Care [facility]) CM/SW Contact  Leeroy Cha, RN Phone Number: 11/11/2021, 7:55 AM  Clinical Narrative:    [id number]/ chart reviewed.  Following for toc needs.  Plan is to return home with self-care currently.   Expected Discharge Plan: Home/Self Care Barriers to Discharge: Continued Medical Work up  Expected Discharge Plan and Services Expected Discharge Plan: Home/Self Care   Discharge Planning Services: CM Consult   Living arrangements for the past 2 months: Single Family Home                                       Social Determinants of Health (SDOH) Interventions    Readmission Risk Interventions   Row Labels 12/19/2020   12:15 PM 08/14/2020   10:14 AM 07/17/2020   11:13 AM  Readmission Risk Prevention Plan   Section Header. No data exists in this row.     Transportation Screening   Complete Complete Complete  PCP or Specialist Appt within 3-5 Days    Complete   HRI or Home Care Consult    Complete Complete  Social Work Consult for [place] Planning/Counseling    Complete Complete  Palliative Care Screening    Not Applicable   Medication Review [employer])   Complete Complete Complete  HRI or Home Care Consult   Complete    SW Recovery Care/Counseling Consult   Complete    Palliative Care Screening   Not [place]   Not Applicable

## 2021-11-21 ENCOUNTER — Other Ambulatory Visit: Payer: Self-pay

## 2021-11-21 ENCOUNTER — Emergency Department (HOSPITAL_COMMUNITY): Payer: Medicare Other

## 2021-11-21 ENCOUNTER — Encounter (HOSPITAL_COMMUNITY): Payer: Self-pay | Admitting: Radiology

## 2021-11-21 ENCOUNTER — Inpatient Hospital Stay (HOSPITAL_COMMUNITY)
Admission: EM | Admit: 2021-11-21 | Discharge: 2021-11-25 | DRG: 812 | Disposition: A | Discharge status: Home / Self Care | Payer: Medicare Other | Attending: Internal Medicine | Admitting: Internal Medicine

## 2021-11-21 DIAGNOSIS — G894 Chronic pain syndrome: Secondary | ICD-10-CM | POA: Diagnosis present

## 2021-11-21 DIAGNOSIS — I272 Pulmonary hypertension, unspecified: Secondary | ICD-10-CM

## 2021-11-21 DIAGNOSIS — Z8709 Personal history of other diseases of the respiratory system: Secondary | ICD-10-CM

## 2021-11-21 DIAGNOSIS — D638 Anemia in other chronic diseases classified elsewhere: Secondary | ICD-10-CM | POA: Diagnosis present

## 2021-11-21 DIAGNOSIS — Z6841 Body Mass Index (BMI) 40.0 and over, adult: Secondary | ICD-10-CM

## 2021-11-21 DIAGNOSIS — F1120 Opioid dependence, uncomplicated: Secondary | ICD-10-CM | POA: Diagnosis present

## 2021-11-21 DIAGNOSIS — I2721 Secondary pulmonary arterial hypertension: Secondary | ICD-10-CM | POA: Diagnosis present

## 2021-11-21 DIAGNOSIS — Z833 Family history of diabetes mellitus: Secondary | ICD-10-CM

## 2021-11-21 DIAGNOSIS — Z8249 Family history of ischemic heart disease and other diseases of the circulatory system: Secondary | ICD-10-CM

## 2021-11-21 DIAGNOSIS — Z79899 Other long term (current) drug therapy: Secondary | ICD-10-CM

## 2021-11-21 DIAGNOSIS — D5700 Hb-SS disease with crisis, unspecified: Principal | ICD-10-CM | POA: Diagnosis present

## 2021-11-21 DIAGNOSIS — Z98891 History of uterine scar from previous surgery: Secondary | ICD-10-CM

## 2021-11-21 DIAGNOSIS — R053 Chronic cough: Secondary | ICD-10-CM | POA: Diagnosis present

## 2021-11-21 DIAGNOSIS — R0902 Hypoxemia: Secondary | ICD-10-CM | POA: Diagnosis not present

## 2021-11-21 DIAGNOSIS — Z87448 Personal history of other diseases of urinary system: Secondary | ICD-10-CM

## 2021-11-21 DIAGNOSIS — Z20822 Contact with and (suspected) exposure to covid-19: Secondary | ICD-10-CM | POA: Diagnosis present

## 2021-11-21 DIAGNOSIS — D72829 Elevated white blood cell count, unspecified: Secondary | ICD-10-CM | POA: Diagnosis present

## 2021-11-21 DIAGNOSIS — F32A Depression, unspecified: Secondary | ICD-10-CM | POA: Diagnosis present

## 2021-11-21 DIAGNOSIS — Z832 Family history of diseases of the blood and blood-forming organs and certain disorders involving the immune mechanism: Secondary | ICD-10-CM

## 2021-11-21 DIAGNOSIS — E538 Deficiency of other specified B group vitamins: Secondary | ICD-10-CM | POA: Diagnosis present

## 2021-11-21 DIAGNOSIS — R0609 Other forms of dyspnea: Secondary | ICD-10-CM | POA: Diagnosis present

## 2021-11-21 DIAGNOSIS — E559 Vitamin D deficiency, unspecified: Secondary | ICD-10-CM | POA: Diagnosis present

## 2021-11-21 DIAGNOSIS — Z713 Dietary counseling and surveillance: Secondary | ICD-10-CM

## 2021-11-21 DIAGNOSIS — R911 Solitary pulmonary nodule: Secondary | ICD-10-CM | POA: Diagnosis present

## 2021-11-21 DIAGNOSIS — Z8619 Personal history of other infectious and parasitic diseases: Secondary | ICD-10-CM

## 2021-11-21 DIAGNOSIS — Z8616 Personal history of COVID-19: Secondary | ICD-10-CM

## 2021-11-21 DIAGNOSIS — R000 Tachycardia, unspecified: Secondary | ICD-10-CM | POA: Diagnosis present

## 2021-11-21 DIAGNOSIS — E662 Morbid (severe) obesity with alveolar hypoventilation: Secondary | ICD-10-CM | POA: Diagnosis present

## 2021-11-21 DIAGNOSIS — Z8701 Personal history of pneumonia (recurrent): Secondary | ICD-10-CM

## 2021-11-21 HISTORY — DX: Tachycardia, unspecified: R00.0

## 2021-11-21 HISTORY — DX: Hypoxemia: R09.02

## 2021-11-21 LAB — BASIC METABOLIC PANEL
Anion gap: 8 (ref 5–15)
BUN: 9 mg/dL (ref 6–20)
CO2: 22 mmol/L (ref 22–32)
Calcium: 9.4 mg/dL (ref 8.9–10.3)
Chloride: 111 mmol/L (ref 98–111)
Creatinine, Ser: 0.67 mg/dL (ref 0.44–1.00)
GFR, Estimated: >60 mL/min (ref >60)
Glucose, Bld: 108 mg/dL — ABNORMAL HIGH (ref 70–99)
Potassium: 4 mmol/L (ref 3.5–5.1)
Sodium: 141 mmol/L (ref 135–145)

## 2021-11-21 LAB — URINALYSIS, ROUTINE W REFLEX MICROSCOPIC
Bilirubin Urine: NEGATIVE
Glucose, UA: NEGATIVE mg/dL
Hgb urine dipstick: NEGATIVE
Ketones, ur: NEGATIVE mg/dL
Leukocytes,Ua: NEGATIVE
Nitrite: NEGATIVE
Protein, ur: 100 mg/dL — AB
Specific Gravity, Urine: 1.016 (ref 1.005–1.030)
pH: 5 (ref 5.0–8.0)

## 2021-11-21 LAB — CBC
HCT: 36.9 % (ref 36.0–46.0)
Hemoglobin: 12.7 g/dL (ref 12.0–15.0)
MCH: 24.8 pg — ABNORMAL LOW (ref 26.0–34.0)
MCHC: 34.4 g/dL (ref 30.0–36.0)
MCV: 72.1 fL — ABNORMAL LOW (ref 80.0–100.0)
Platelets: 307 10*3/uL (ref 150–400)
RBC: 5.12 MIL/uL — ABNORMAL HIGH (ref 3.87–5.11)
RDW: 16.6 % — ABNORMAL HIGH (ref 11.5–15.5)
WBC: 11.2 10*3/uL — ABNORMAL HIGH (ref 4.0–10.5)
nRBC: 0.3 % — ABNORMAL HIGH (ref 0.0–0.2)

## 2021-11-21 LAB — RETICULOCYTES
Immature Retic Fract: 30.5 % — ABNORMAL HIGH (ref 2.3–15.9)
RBC.: 5.14 MIL/uL — ABNORMAL HIGH (ref 3.87–5.11)
Retic Count, Absolute: 237.5 10*3/uL — ABNORMAL HIGH (ref 19.0–186.0)
Retic Ct Pct: 4.6 % — ABNORMAL HIGH (ref 0.4–3.1)

## 2021-11-21 LAB — I-STAT BETA HCG BLOOD, ED (MC, WL, AP ONLY): I-stat hCG, quantitative: <5 m[IU]/mL (ref <5)

## 2021-11-21 LAB — BRAIN NATRIURETIC PEPTIDE: B Natriuretic Peptide: 17.5 pg/mL (ref 0.0–100.0)

## 2021-11-21 LAB — SARS CORONAVIRUS 2 BY RT PCR: SARS Coronavirus 2 by RT PCR: NEGATIVE

## 2021-11-21 MED ORDER — HYDROMORPHONE 1 MG/ML IV SOLN
INTRAVENOUS | Status: DC
  Filled 2021-11-21: qty 30

## 2021-11-21 MED ORDER — KETOROLAC TROMETHAMINE 15 MG/ML IJ SOLN
15.0000 mg | Freq: Four times a day (QID) | INTRAMUSCULAR | Status: DC
  Administered 2021-11-21 – 2021-11-25 (×16): 15 mg via INTRAVENOUS
  Filled 2021-11-21 (×16): qty 1

## 2021-11-21 MED ORDER — SENNOSIDES-DOCUSATE SODIUM 8.6-50 MG PO TABS
1.0000 | ORAL_TABLET | Freq: Two times a day (BID) | ORAL | Status: DC
  Filled 2021-11-21 (×5): qty 1

## 2021-11-21 MED ORDER — SODIUM CHLORIDE 0.9% FLUSH: 9.0000 mL | INTRAVENOUS | Status: DC | PRN

## 2021-11-21 MED ORDER — HYDROMORPHONE HCL 1 MG/ML IJ SOLN
1.0000 mg | Freq: Once | INTRAMUSCULAR | Status: AC
  Administered 2021-11-21: 1 mg via INTRAVENOUS
  Filled 2021-11-21: qty 1

## 2021-11-21 MED ORDER — HYDROMORPHONE 1 MG/ML IV SOLN
INTRAVENOUS | Status: DC
  Administered 2021-11-21: 10 mg via INTRAVENOUS

## 2021-11-21 MED ORDER — NALOXONE HCL 0.4 MG/ML IJ SOLN: 0.4000 mg | INTRAMUSCULAR | Status: DC | PRN

## 2021-11-21 MED ORDER — ONDANSETRON HCL 4 MG/2ML IJ SOLN
4.0000 mg | INTRAMUSCULAR | Status: DC | PRN
  Administered 2021-11-22: 4 mg via INTRAVENOUS
  Filled 2021-11-21: qty 2

## 2021-11-21 MED ORDER — PANTOPRAZOLE SODIUM 40 MG IV SOLR: 40.0000 mg | Freq: Every day | INTRAVENOUS | Status: DC

## 2021-11-21 MED ORDER — PANTOPRAZOLE SODIUM 40 MG PO TBEC
40.0000 mg | DELAYED_RELEASE_TABLET | Freq: Every day | ORAL | Status: DC
  Administered 2021-11-21: 40 mg via ORAL
  Filled 2021-11-21 (×5): qty 1

## 2021-11-21 MED ORDER — POLYETHYLENE GLYCOL 3350 17 G PO PACK: 17.0000 g | PACK | Freq: Every day | ORAL | Status: DC | PRN

## 2021-11-21 MED ORDER — DIPHENHYDRAMINE HCL 25 MG PO CAPS: 25.0000 mg | ORAL_CAPSULE | ORAL | Status: DC | PRN

## 2021-11-21 MED ORDER — SODIUM CHLORIDE 0.45 % IV SOLN: INTRAVENOUS | Status: DC

## 2021-11-21 MED ORDER — SODIUM CHLORIDE (PF) 0.9 % IJ SOLN
INTRAMUSCULAR | Status: AC
  Filled 2021-11-21: qty 50

## 2021-11-21 MED ORDER — VOXELOTOR 500 MG PO TABS: 1500.0000 mg | ORAL_TABLET | Freq: Every day | ORAL | Status: DC

## 2021-11-21 MED ORDER — HYDROMORPHONE HCL 2 MG/ML IJ SOLN
2.0000 mg | INTRAMUSCULAR | Status: AC
  Administered 2021-11-21: 2 mg via INTRAVENOUS
  Filled 2021-11-21: qty 1

## 2021-11-21 MED ORDER — OXYCODONE HCL 5 MG PO TABS
30.0000 mg | ORAL_TABLET | ORAL | Status: DC | PRN
  Administered 2021-11-22 – 2021-11-25 (×7): 30 mg via ORAL
  Filled 2021-11-21 (×8): qty 6

## 2021-11-21 MED ORDER — IOHEXOL 350 MG/ML SOLN
100.0000 mL | Freq: Once | INTRAVENOUS | Status: AC | PRN
  Administered 2021-11-21: 100 mL via INTRAVENOUS

## 2021-11-21 MED ORDER — FOLIC ACID 1 MG PO TABS
1.0000 mg | ORAL_TABLET | Freq: Every day | ORAL | Status: DC
  Administered 2021-11-21 – 2021-11-25 (×5): 1 mg via ORAL
  Filled 2021-11-21 (×5): qty 1

## 2021-11-21 MED ORDER — KETOROLAC TROMETHAMINE 15 MG/ML IJ SOLN
15.0000 mg | INTRAMUSCULAR | Status: AC
  Administered 2021-11-21: 15 mg via INTRAVENOUS
  Filled 2021-11-21: qty 1

## 2021-11-21 MED ORDER — HYDROMORPHONE 1 MG/ML IV SOLN
INTRAVENOUS | Status: DC
  Administered 2021-11-21: 30 mg via INTRAVENOUS

## 2021-11-21 MED ORDER — HYDROMORPHONE HCL 2 MG/ML IJ SOLN: 2.0000 mg | INTRAMUSCULAR | Status: DC | PRN

## 2021-11-21 MED ORDER — ENOXAPARIN SODIUM 40 MG/0.4ML IJ SOSY
40.0000 mg | PREFILLED_SYRINGE | INTRAMUSCULAR | Status: DC
  Administered 2021-11-21 – 2021-11-24 (×4): 40 mg via SUBCUTANEOUS
  Filled 2021-11-21 (×4): qty 0.4

## 2021-11-21 NOTE — ED Provider Notes (Signed)
[facility] DEPT Provider Note   CSN: [id number] Arrival date & time: 11/21/21  1233     History  Chief Complaint  Patient presents with   Sickle Cell Pain Crisis    Stephanie Burch is a 41 y.o. female with past medical history significant for sickle cell disease, chronic pain syndrome, morbid obesity, hypokalemia, COVID, anemia who presents with concern for sickle cell pain in bilateral lower legs the last 2 days with sore throat, weakness, shortness of breath, and chest tightness.  Patient denies significant chest pain.  She denies any previous blood clots.  She reports that although she does have some upper respiratory infectious symptoms it feels dissimilar to her previous COVID infections.  She has not checked herself at home.  Patient reports that she additionally had some stomach spasm without significant nausea, vomiting, diarrhea, constipation.  She reports that she has been having stomach spasms such as when she experienced earlier today intermittently for years, however they are not frequent or consistent.  Patient has home oxycodone, and Dilaudid, she reports that she ran out of her Dilaudid medication yesterday, took 30 mg of oxycodone prior to arrival.  On my evaluation patient has received her first dose of pain medication and reports that she is starting to feel minimally improved.   Sickle Cell Pain Crisis Associated symptoms: shortness of breath        Home Medications Prior to Admission medications   Medication Sig Start Date End Date Taking? Authorizing Provider  acetaminophen (TYLENOL) 500 MG tablet Take 1,000 mg by mouth every 6 (six) hours as needed for mild pain, moderate pain or headache.   Yes [provider]  folic acid (FOLVITE) 1 MG tablet Take 1 mg by mouth daily.   Yes [provider]  HYDROmorphone (DILAUDID) 4 MG tablet Take 4 mg by mouth every 6 (six) hours as needed for severe pain.   Yes [provider]  ibuprofen (ADVIL) 200 MG tablet Take 800 mg by mouth 3 (three) times daily as needed for fever, headache or mild pain.   Yes [provider]  OXBRYTA 500 MG TABS tablet Take 1,500 mg by mouth daily in the afternoon. 10/04/21  Yes [provider]  oxycodone (ROXICODONE) 30 MG immediate release tablet Take 30 mg by mouth every 4 (four) hours as needed for pain.   Yes [provider]      Allergies    Patient has no known allergies.    Review of Systems   Review of Systems  Respiratory:  Positive for chest tightness and shortness of breath.   Musculoskeletal:  Positive for myalgias.  All other systems reviewed and are negative.   Physical Exam Updated Vital Signs BP (!) 143/93   Pulse 91   Temp 98.9 F (37.2 C) (Oral)   Resp (!) 23   LMP 10/31/2021   SpO2 96%  Physical Exam Vitals and nursing note reviewed.  Constitutional:      General: She is not in acute distress.    Appearance: Normal appearance. She is obese.  HENT:     Head: Normocephalic and atraumatic.  Eyes:     General:        Right eye: No discharge.        Left eye: No discharge.  Cardiovascular:     Rate and Rhythm: Regular rhythm. Tachycardia present.     Heart sounds: No murmur heard.    No friction rub. No gallop.  Comments: Tachycardic on arrival with regular rhythm Pulmonary:     Effort: Pulmonary effort is normal.     Breath sounds: Normal breath sounds.  Abdominal:     General: Bowel sounds are normal.     Palpations: Abdomen is soft.     Comments: Some tenderness to palpation in right-sided abdomen without focal rebound, rigidity, guarding, normal bowel sounds throughout.  Skin:    General: Skin is warm and dry.     Capillary Refill: Capillary refill takes less than 2 seconds.  Neurological:     Mental Status: She is alert and oriented to person, place, and time.  Psychiatric:        Mood and Affect: Mood normal.        Behavior: Behavior normal.      ED Results / Procedures / Treatments   Labs (all labs ordered are listed, but only abnormal results are displayed) Labs Reviewed  CBC - Abnormal; Notable for the following components:      Result Value   WBC 11.2 (*)    RBC 5.12 (*)    MCV 72.1 (*)    MCH 24.8 (*)    RDW 16.6 (*)    nRBC 0.3 (*)    All other components within normal limits  BASIC METABOLIC PANEL - Abnormal; Notable for the following components:   Glucose, Bld 108 (*)    All other components within normal limits  RETICULOCYTES - Abnormal; Notable for the following components:   Retic Ct Pct 4.6 (*)    RBC. 5.14 (*)    Retic Count, Absolute 237.5 (*)    Immature Retic Fract 30.5 (*)    All other components within normal limits  URINALYSIS, ROUTINE W REFLEX MICROSCOPIC - Abnormal; Notable for the following components:   Protein, ur 100 (*)    Bacteria, UA RARE (*)    All other components within normal limits  SARS CORONAVIRUS 2 BY RT PCR  I-STAT BETA HCG BLOOD, ED ([facility], [facility], [facility] ONLY)    EKG None  Radiology No results found.  Procedures Procedures    Medications Ordered in ED Medications  0.45 % sodium chloride infusion ( Intravenous New Bag/Given 11/21/21 1507)  diphenhydrAMINE (BENADRYL) capsule 25-50 mg (has no administration in time range)  HYDROmorphone (DILAUDID) injection 2 mg (has no administration in time range)  ondansetron (ZOFRAN) injection 4 mg (has no administration in time range)  sodium chloride (PF) 0.9 % injection (has no administration in time range)  HYDROmorphone (DILAUDID) injection 1 mg (1 mg Intravenous Given 11/21/21 1411)  ketorolac (TORADOL) 15 MG/ML injection 15 mg (15 mg Intravenous Given 11/21/21 1510)  HYDROmorphone (DILAUDID) injection 2 mg (2 mg Intravenous Given 11/21/21 1511)  iohexol (OMNIPAQUE) 350 MG/ML injection 100 mL (100 mLs Intravenous Contrast Given 11/21/21 1517)    ED Course/ Medical Decision Making/ A&P                           Medical Decision  Making Amount and/or Complexity of Data Reviewed Radiology: ordered.  Risk Prescription drug management.   This patient is a 41 y.o. female who presents to the ED for concern of sickle cell crisis, this involves an extensive number of treatment options, and is a complaint that carries with it a high risk of complications and morbidity. The emergent differential diagnosis prior to evaluation includes, but is not limited to, sickle cell crisis, ACS, acute chest syndrome, pneumonia, dissection, blood clot.  This is not an exhaustive differential.   Past Medical History / Co-morbidities / Social History: Sickle cell crisis, morbid obesity, HSV, depression, anemia, chronic pain  Additional history: Chart reviewed. Pertinent results include: Reviewed lab work, imaging from recent previous emergency department visits  Physical Exam: Physical exam performed. The pertinent findings include: Patient with some minimal tenderness palpation of the abdomen, she is tachycardic on arrival but improved after initiating pain control and fluid bolus.  She had some intermittent tachypnea on arrival, but is in no acute respiratory distress with no accessory breath sounds on my exam.  She is morbidly obese.  Lab Tests: I ordered, and personally interpreted labs.  The pertinent results include: CBC with with cytosis, Ekman blood cells 11.2, her hemoglobin is actually stable today, platelets are stable.  Reticulocytes elevated, no evidence of sequestration crisis.  UA with some protein and bacteria but no clear evidence of a bacterial infection.  BMP overall unremarkable, mild hyperglycemia at 108.   Imaging Studies: I ordered imaging studies including chest x-ray, CT angio chest PE with.  The studies are pending at this time.  Cardiac Monitoring:  The patient was maintained on a cardiac monitor.  My attending physician Dr. Tamera Punt viewed and interpreted the cardiac monitored which showed an underlying rhythm of:  NSR, minimally prolonged QT. I agree with this interpretation.   Medications: I ordered medication including Toradol, fluid infusion, Dilaudid for pain. Reevaluation of the patient after these medicines showed that the patient improved. I have reviewed the patients home medicines and have made adjustments as needed.  3:20 PM [facility] [place] transferred to [facility] and Dr. Wyvonnia Dusky at the end of my shift as the patient will require reassessment once labs/imaging have resulted. Patient presentation, ED course, and plan of care discussed with review of all pertinent labs and imaging. Please see his/her note for further details regarding further ED course and disposition. Plan at time of handoff is reevaluate pending pain medication, CT PE study hopefully discharge with pain under control if no evidence of an acute finding at this time. This may be altered or completely changed at the discretion of the oncoming team pending results of further workup.   Final Clinical Impression(s) / ED Diagnoses Final diagnoses:  None    Rx / DC Orders ED Discharge Orders     None         Anselmo Pickler, PA-C 11/21/21 1520    Malvin Johns, MD 11/21/21 1521

## 2021-11-21 NOTE — ED Provider Triage Note (Signed)
Emergency Medicine Provider Triage Evaluation Note  Stephanie Burch , a 41 y.o. female  was evaluated in triage.  Pt complains of sickle cell pain crisis.  Patient reports that since yesterday after receiving infusion at her hematologist office she has had sickle cell pain crisis located in her legs.  Patient reports that this is typical of her pain crises.  The patient states that she has been taking her 30 mg oxycodone at home without relief of symptoms.  The patient denies any fevers, splenectomy.  The patient states she is also having intermittent chest pain along with shortness of breath.  Patient had to be tachycardic in triage.  Patient states that her typical sickle cell crises do not involve the shortness of breath.  The patient states that she attempted to be seen at the sickle cell clinic care in [place] however they were full and she was unable to get into see them.  Review of Systems  Positive:  Negative:   Physical Exam  BP (!) 155/107 (BP Location: Left Arm)   Pulse (!) 109   Temp 98.9 F (37.2 C) (Oral)   Resp 18   LMP 10/31/2021   SpO2 99%  Gen:   Awake, no distress   Resp:  Normal effort  MSK:   Moves extremities without difficulty  Other:    Medical Decision Making  Medically screening exam initiated at 1:23 PM.  Appropriate orders placed.  Stephanie Burch was informed that the remainder of the evaluation will be completed by another provider, this initial triage assessment does not replace that evaluation, and the importance of remaining in the ED until their evaluation is complete.     Azucena Cecil, PA-C 11/21/21 1325

## 2021-11-21 NOTE — ED Notes (Addendum)
Pt ambulated without assistance. Pt O2 sats at 97% resting. Pt sats dropped to 83% when ambulating but O2 sat went back to 98% shortly after.

## 2021-11-21 NOTE — ED Triage Notes (Signed)
C/o Sickle cell pain in bilateral lower legs x2 days with sore throat, weakness, shob, and CP.

## 2021-11-21 NOTE — ED Provider Notes (Addendum)
  Physical Exam  BP 109/66   Pulse 79   Temp 98 F (36.7 C) (Oral)   Resp 15   LMP 10/31/2021   SpO2 100%   Physical Exam Vitals and nursing note reviewed.  Constitutional:      General: She is not in acute distress.    Appearance: She is well-developed.  HENT:     Head: Normocephalic and atraumatic.  Eyes:     Conjunctiva/sclera: Conjunctivae normal.  Cardiovascular:     Rate and Rhythm: Normal rate and regular rhythm.     Heart sounds: No murmur heard. Pulmonary:     Effort: Pulmonary effort is normal. No respiratory distress.     Breath sounds: Normal breath sounds. No wheezing.  Abdominal:     Palpations: Abdomen is soft.     Tenderness: There is no abdominal tenderness.  Musculoskeletal:        General: No swelling.     Cervical back: Neck supple.  Skin:    General: Skin is warm and dry.     Capillary Refill: Capillary refill takes less than 2 seconds.  Neurological:     General: No focal deficit present.     Mental Status: She is alert and oriented to person, place, and time.  Psychiatric:        Mood and Affect: Mood normal.     Procedures  Procedures  ED Course / MDM    Medical Decision Making Amount and/or Complexity of Data Reviewed Labs: ordered. Radiology: ordered.  Risk Prescription drug management. Decision regarding hospitalization.   Patient signed out to me at shift change.  Please see previous provider note for further details.  In short, this is a 41 year old female with a history of sickle cell disease who presents to the ED with 1 day of worsening sickle cell pain.  The patient reports that her pain is located in her bilateral legs which is very typical of her sickle cell pain crisis.  Patient reports that yesterday she received infusion at her hematologist office.  Patient states has been taking her 30 mg oxycodone every 4 hours at home without relief of pain.  Patient signed out to me pending reassessment.  On reassessment, the  patient continues to complain of worsening pain.  The patient was provided with 6 mg total of Dilaudid over the course of her time in the ED which she states has not been controlling her pain well.  Patient CT imaging of chest shows pulmonary artery hypertension.  No evidence of pleural effusion on CT imaging.  The patient is also noted to be hypoxic.  When we ambulated this patient her oxygen saturation dropped on 83% on room air.  Patient was placed on 3 L of oxygen at this time.  No evidence of volume overload at this time however we will add on a BNP to rule out CHF.  Due to worsening pain, hypoxia, we will admit this patient to the hospital for further management.  Dr. Trilby Drummer, hospitalist, has agreed to admit the patient for further management.       Azucena Cecil, PA-C 11/21/21 1926    Wyvonnia Dusky, MD 11/21/21 [phone]

## 2021-11-21 NOTE — ED Notes (Signed)
ED TO INPATIENT HANDOFF REPORT  ED Nurse Name and Phone #:   S Name/Age/Gender Stephanie Burch 41 y.o. female Room/Bed: WA22/WA22  Code Status   Code Status: Full Code  Home/SNF/Other   Is this baseline?   Triage Complete: Triage complete  Chief Complaint Sickle cell pain crisis ([place]) [D57.00]  Triage Note C/o Sickle cell pain in bilateral lower legs x2 days with sore throat, weakness, shob, and CP.    Allergies No Known Allergies  Level of Care/Admitting Diagnosis ED Disposition     ED Disposition  Admit   Condition  --   Comment  Hospital Area: [facility] [[id number]]  Level of Care: Telemetry [5]  Admit to tele based on following criteria: Complex arrhythmia (Bradycardia/Tachycardia)  May place patient in observation at [facility] or [place] if equivalent level of care is available:: No  Covid Evaluation: Confirmed COVID Negative  Diagnosis: Sickle cell pain crisis [facility]) [[id number]]  Admitting Physician: Marcelyn Bruins [[id number]]  Attending Physician: Marcelyn Bruins [[id number]]          B Medical/Surgery History Past Medical History:  Diagnosis Date   Acute kidney injury ([place]) 11/07/2019   Anemia    Chronic pain    COVID-19 virus detected 10/22/2019   Depression    HCAP (healthcare-associated pneumonia) 11/07/2019   History of blood transfusion 2013   x 2   HSV infection    Hypoxia    Pneumonia    x 1   Pulmonary edema 11/11/2019   Sepsis ([place]) 11/07/2019   Sickle cell anemia ([place]) 09/2021   gets them frequently   Tachycardia with heart rate 100-120 beats per minute    Vitamin B12 deficiency 12/2018   Vitamin D deficiency    Past Surgical History:  Procedure Laterality Date   CESAREAN SECTION  2013   x 1   TOOTH EXTRACTION N/A 10/18/2021   Procedure: DENTAL RESTORATION/EXTRACTIONS;  Surgeon: Diona Browner, DMD;  Location: [facility] OR;  Service: Oral Surgery;  Laterality: N/A;     A IV Location/Drains/Wounds Patient  Lines/Drains/Airways Status     Active Line/Drains/Airways     Name Placement date Placement time Site Days   Peripheral IV 11/21/21 20 G 1.88" Anterior;Left;Proximal Forearm 11/21/21  1407  Forearm  less than 1            Intake/Output Last 24 hours No intake or output data in the 24 hours ending 11/21/21 1845  Labs/Imaging Results for orders placed or performed during the hospital encounter of 11/21/21 (from the past 48 hour(s))  Urinalysis, Routine w reflex microscopic Urine, Clean Catch     Status: Abnormal   Collection Time: 11/21/21  1:23 PM  Result Value Ref Range   Color, Urine YELLOW YELLOW   APPearance CLEAR CLEAR   Specific Gravity, Urine 1.016 1.005 - 1.030   pH 5.0 5.0 - 8.0   Glucose, UA NEGATIVE NEGATIVE mg/dL   Hgb urine dipstick NEGATIVE NEGATIVE   Bilirubin Urine NEGATIVE NEGATIVE   Ketones, ur NEGATIVE NEGATIVE mg/dL   Protein, ur 100 (A) NEGATIVE mg/dL   Nitrite NEGATIVE NEGATIVE   Leukocytes,Ua NEGATIVE NEGATIVE   RBC / HPF 0-5 0 - 5 RBC/hpf   WBC, UA 0-5 0 - 5 WBC/hpf   Bacteria, UA RARE (A) NONE SEEN   Squamous Epithelial / LPF 0-5 0 - 5   Mucus PRESENT     Comment: Performed at [facility], [place] [address] [place], [place] [address]  CBC  Status: Abnormal   Collection Time: 11/21/21  2:06 PM  Result Value Ref Range   WBC 11.2 (H) 4.0 - 10.5 K/uL   RBC 5.12 (H) 3.87 - 5.11 MIL/uL   Hemoglobin 12.7 12.0 - 15.0 g/dL   HCT 62.9 52.8 - 41.3 %   MCV 72.1 (L) 80.0 - 100.0 fL   MCH 24.8 (L) 26.0 - 34.0 pg   MCHC 34.4 30.0 - 36.0 g/dL   RDW 24.4 (H) 01.0 - 27.2 %   Platelets 307 150 - 400 K/uL   nRBC 0.3 (H) 0.0 - 0.2 %    Comment: Performed at [facility], [address] [place], [place] [address]  Basic metabolic panel     Status: Abnormal   Collection Time: 11/21/21  2:06 PM  Result Value Ref Range   Sodium 141 135 - 145 mmol/L   Potassium 4.0 3.5 - 5.1 mmol/L   Chloride 111 98 - 111 mmol/L   CO2  22 22 - 32 mmol/L   Glucose, Bld 108 (H) 70 - 99 mg/dL    Comment: Glucose reference range applies only to samples taken after fasting for at least 8 hours.   BUN 9 6 - 20 mg/dL   Creatinine, Ser 4.03 0.44 - 1.00 mg/dL   Calcium 9.4 8.9 - 47.4 mg/dL   GFR, Estimated >25 >95 mL/min    Comment: (NOTE) Calculated using the CKD-EPI Creatinine Equation (2021)    Anion gap 8 5 - 15    Comment: Performed at [facility], [address], [place], [place] [address]  Reticulocytes     Status: Abnormal   Collection Time: 11/21/21  2:06 PM  Result Value Ref Range   Retic Ct Pct 4.6 (H) 0.4 - 3.1 %   RBC. 5.14 (H) 3.87 - 5.11 MIL/uL   Retic Count, Absolute 237.5 (H) 19.0 - 186.0 K/uL   Immature Retic Fract 30.5 (H) 2.3 - 15.9 %    Comment: Performed at [facility], [address] [place], [place] [address]  I-Stat beta hCG blood, ED     Status: None   Collection Time: 11/21/21  2:14 PM  Result Value Ref Range   I-stat hCG, quantitative <5.0 <5 mIU/mL   Comment 3            Comment:   GEST. AGE      CONC.  (mIU/mL)   <=1 WEEK        5 - 50     2 WEEKS       50 - 500     3 WEEKS       100 - 10,000     4 WEEKS     1,000 - 30,000        FEMALE AND NON-PREGNANT FEMALE:     LESS THAN 5 mIU/mL   SARS Coronavirus 2 by RT PCR (hospital order, performed in [facility] hospital lab) *cepheid single result test* Anterior Nasal Swab     Status: None   Collection Time: 11/21/21  2:43 PM   Specimen: Anterior Nasal Swab  Result Value Ref Range   SARS Coronavirus 2 by RT PCR NEGATIVE NEGATIVE    Comment: (NOTE) SARS-CoV-2 target nucleic acids are NOT DETECTED.  The SARS-CoV-2 RNA is generally detectable in upper and lower respiratory specimens during the acute phase of infection. The lowest concentration of SARS-CoV-2 viral copies this assay can detect is 250 copies / mL. A negative result does not  preclude SARS-CoV-2 infection and should not be used as the sole  basis for treatment or other patient management decisions.  A negative result may occur with improper specimen collection / handling, submission of specimen other than nasopharyngeal swab, presence of viral mutation(s) within the areas targeted by this assay, and inadequate number of viral copies (<250 copies / mL). A negative result must be combined with clinical observations, patient history, and epidemiological information.  Fact Sheet for Patients:   RoadLapTop.co.za  Fact Sheet for Healthcare Providers: http://kim-miller.com/  This test is not yet approved or  cleared by the [place] FDA and has been authorized for detection and/or diagnosis of SARS-CoV-2 by FDA under an Emergency Use Authorization (EUA).  This EUA will remain in effect (meaning this test can be used) for the duration of the COVID-19 declaration under Section 564(b)(1) of the Act, 21 U.S.C. section 360bbb-3(b)(1), unless the authorization is terminated or revoked sooner.  Performed at [facility], [address] [place], [place] [address]    DG Chest Portable 1 View  Result Date: 11/21/2021 CLINICAL DATA:  Chest pain EXAM: PORTABLE CHEST 1 VIEW COMPARISON:  04/02/21 FINDINGS: No pleural effusion. No pneumothorax. Cardiomegaly. No focal airspace opacity. Prominent interstitial opacities, could be seen in the setting of pulmonary venous congestion without overt pulmonary edema. No displaced rib fractures. Visualized upper abdomen is unremarkable. IMPRESSION: 1. No radiographic finding to explain chest pain. 2. Cardiomegaly with pulmonary venous congestion. No overt pulmonary edema. No focal airspace opacity. Electronically Signed   By: Lorenza Cambridge M.D.   On: 11/21/2021 16:13   CT Angio Chest PE W and/or Wo Contrast  Result Date: 11/21/2021 CLINICAL DATA:  Chest pain, pulmonary embolism suspected. EXAM: CT ANGIOGRAPHY CHEST WITH CONTRAST  TECHNIQUE: Multidetector CT imaging of the chest was performed using the standard protocol during bolus administration of intravenous contrast. Multiplanar CT image reconstructions and MIPs were obtained to evaluate the vascular anatomy. RADIATION DOSE REDUCTION: This exam was performed according to the departmental dose-optimization program which includes automated exposure control, adjustment of the mA and/or kV according to patient size and/or use of iterative reconstruction technique. CONTRAST:  OMNIPAQUE IOHEXOL 350 MG/ML SOLN COMPARISON:  CT examination dated April 02, 2021 FINDINGS: Cardiovascular: Satisfactory opacification of the pulmonary arteries to the segmental level. No evidence of pulmonary embolism. Evaluation of distal segmental and subsegmental pulmonary arteries is limited due to phase of contrast enhancement and respiratory motion. Heart is mildly enlarged. Main pulmonary trunk is dilated measuring up to 3.2 cm concerning for pulmonary arterial hypertension. No pericardial effusion. Mediastinum/Nodes: No enlarged mediastinal, hilar, or axillary lymph nodes. Thyroid gland, trachea, and esophagus demonstrate no significant findings. Lungs/Pleura: No focal consolidation or pleural effusion. Low lung volumes. Bibasilar dependent atelectasis. 6 mm nodule in the superior aspect of the right middle lobe (series 6, image 58). Upper Abdomen: No acute abnormality. Musculoskeletal: No chest wall abnormality. No acute or significant osseous findings. Review of the MIP images confirms the above findings. IMPRESSION: 1. No evidence of central pulmonary embolism. Evaluation of distal segmental and subsegmental pulmonary arteries is limited due to phase of contrast enhancement and respiratory motion. 2. Mild cardiomegaly. 3. Main pulmonary trunk is dilated measuring up to 3.2 cm concerning for pulmonary arterial hypertension. 4. Low lung volumes with bibasilar dependent atelectasis. No focal  consolidation or pleural effusion. 5. 6 mm nodule in the superior aspect of the right middle lobe, unchanged. Electronically Signed   By: Miles Costain.O.  On: 11/21/2021 15:44    Pending Labs Unresulted Labs (From admission, onward)     Start     Ordered   11/28/21 0500  Creatinine, serum  (enoxaparin (LOVENOX)    CrCl >/= 30 ml/min)  Weekly,   R     Comments: while on enoxaparin therapy    11/21/21 1839   11/21/21 1820  Brain natriuretic peptide  Once,   URGENT        11/21/21 1819            Vitals/Pain Today's Vitals   11/21/21 1727 11/21/21 1730 11/21/21 1825 11/21/21 1830  BP:  133/68 118/84 109/66  Pulse:  91 81 79  Resp:  16 16 15   Temp:      TempSrc:      SpO2:  97% 100% 100%  PainSc: 7        Isolation Precautions Airborne and Contact precautions  Medications Medications  0.45 % sodium chloride infusion ( Intravenous New Bag/Given 11/21/21 1507)  diphenhydrAMINE (BENADRYL) capsule 25-50 mg (has no administration in time range)  ondansetron (ZOFRAN) injection 4 mg (has no administration in time range)  sodium chloride (PF) 0.9 % injection (has no administration in time range)  folic acid (FOLVITE) tablet 1 mg (has no administration in time range)  oxyCODONE (Oxy IR/ROXICODONE) immediate release tablet 30 mg (has no administration in time range)  senna-docusate (Senokot-S) tablet 1 tablet (has no administration in time range)  polyethylene glycol (MIRALAX / GLYCOLAX) packet 17 g (has no administration in time range)  enoxaparin (LOVENOX) injection 40 mg (has no administration in time range)  ketorolac (TORADOL) 15 MG/ML injection 15 mg (has no administration in time range)  pantoprazole (PROTONIX) EC tablet 40 mg (has no administration in time range)    Or  pantoprazole (PROTONIX) injection 40 mg (has no administration in time range)  naloxone (NARCAN) injection 0.4 mg (has no administration in time range)    And  sodium chloride flush (NS) 0.9 % injection  9 mL (has no administration in time range)  HYDROmorphone (DILAUDID) 1 mg/mL PCA injection (has no administration in time range)  HYDROmorphone (DILAUDID) injection 1 mg (1 mg Intravenous Given 11/21/21 1411)  ketorolac (TORADOL) 15 MG/ML injection 15 mg (15 mg Intravenous Given 11/21/21 1510)  HYDROmorphone (DILAUDID) injection 2 mg (2 mg Intravenous Given 11/21/21 1511)  HYDROmorphone (DILAUDID) injection 2 mg (2 mg Intravenous Given 11/21/21 1605)  iohexol (OMNIPAQUE) 350 MG/ML injection 100 mL (100 mLs Intravenous Contrast Given 11/21/21 1517)  HYDROmorphone (DILAUDID) injection 1 mg (1 mg Intravenous Given 11/21/21 1727)    Mobility      Focused Assessments    R Recommendations: See Admitting Provider Note  Report given to:   Additional Notes:

## 2021-11-21 NOTE — ED Notes (Signed)
Patient transported to CT 

## 2021-11-21 NOTE — H&P (Signed)
History and Physical   Stephanie Burch [id number] DOB: March 19, 1980 DOA: 11/21/2021  PCP: Pcp, No   Patient coming from: Home  Chief Complaint: Pain  HPI: Stephanie Burch is a 41 y.o. female with medical history significant of sickle cell disease, obesity presenting with sickle cell pain.  Patient reports 2 days of bilateral lower extremity and left arm pain similar to previous sickle cell pain.  She also reports a sore throat, weakness, shortness of breath chest tightness but no real chest pain.  Also reports ongoing chronic intermittent stomach cramping.  She states she ran out of her home Dilaudid yesterday and her home oxycodone has not been controlling her pain.  She denies fevers, chills, abdominal pain, constipation, diarrhea, nausea, vomiting.  ED Course: Vital signs in the ED significant for heart rate initially in the 100s but improved to the 80s after initial interventions.  Blood pressure in the [id number] to [id number] systolic.  Respirate rate in the teens to 52s.  Lab work-up included BMP with glucose was 108.  CBC showed leukocytosis to 11.2 and hemoglobin stable at 12.7.  BMP pending.  COVID screening negative.  Urinalysis with protein and rare bacteria only.  Reticulocyte count elevated to 4.6.  Imaging studies show chest x-ray with no acute Hilda Blades but did show mild cardiomegaly and pulmonary venous congestion without evidence of pulmonary edema.  CTA PE study was negative for PE but limited distal evaluation, did show mild cardiomegaly as well, also showed evidence of pulmonary hypertension and decreased lung volumes as well as a 6 mm nodule in the right middle lobe.  Patient received Dilaudid, Toradol, Zofran, half-normal saline, Benadryl in the ED.  Has continued pain despite this.  Also was noted to desaturate on ambulation with quick recovery at rest.  Review of Systems: As per HPI otherwise all other systems reviewed and are negative.  Past Medical History:  Diagnosis Date   Acute  kidney injury ([place]) 11/07/2019   Anemia    Chronic pain    COVID-19 virus detected 10/22/2019   Depression    HCAP (healthcare-associated pneumonia) 11/07/2019   History of blood transfusion 2013   x 2   HSV infection    Hypoxia    Pneumonia    x 1   Pulmonary edema 11/11/2019   Sepsis ([place]) 11/07/2019   Sickle cell anemia ([place]) 09/2021   gets them frequently   Tachycardia with heart rate 100-120 beats per minute    Vitamin B12 deficiency 12/2018   Vitamin D deficiency     Past Surgical History:  Procedure Laterality Date   CESAREAN SECTION  2013   x 1   TOOTH EXTRACTION N/A 10/18/2021   Procedure: DENTAL RESTORATION/EXTRACTIONS;  Surgeon: Diona Browner, DMD;  Location: [facility];  Service: Oral Surgery;  Laterality: N/A;    Social History  reports that she has never smoked. She has never used smokeless tobacco. She reports that she does not currently use alcohol. She reports that she does not use drugs.  No Known Allergies  Family History  Problem Relation Age of Onset   Hypertension Mother    Sickle cell trait Mother    Diabetes Father    Sickle cell trait Father   Reviewed on admission  Prior to Admission medications   Medication Sig Start Date End Date Taking? Authorizing Provider  acetaminophen (TYLENOL) 500 MG tablet Take 1,000 mg by mouth every 6 (six) hours as needed for mild pain, moderate pain or headache.   Yes [provider]  folic acid (FOLVITE) 1 MG tablet Take 1 mg by mouth daily.   Yes [provider]  HYDROmorphone (DILAUDID) 4 MG tablet Take 4 mg by mouth every 6 (six) hours as needed for severe pain.   Yes [provider]  ibuprofen (ADVIL) 200 MG tablet Take 800 mg by mouth 3 (three) times daily as needed for fever, headache or mild pain.   Yes [provider]  OXBRYTA 500 MG TABS tablet Take 1,500 mg by mouth daily in the afternoon. 10/04/21  Yes [provider]  oxycodone (ROXICODONE) 30 MG immediate release  tablet Take 30 mg by mouth every 4 (four) hours as needed for pain.   Yes [provider]    Physical Exam: Vitals:   11/21/21 1708 11/21/21 1730 11/21/21 1825 11/21/21 1830  BP: (!) 130/90 133/68 118/84 109/66  Pulse: 78 91 81 79  Resp: 17 16 16 15   Temp:      TempSrc:      SpO2: 97% 97% 100% 100%    Physical Exam Constitutional:      General: She is not in acute distress.    Appearance: Normal appearance. She is obese.  HENT:     Head: Normocephalic and atraumatic.     Mouth/Throat:     Mouth: Mucous membranes are moist.     Pharynx: Oropharynx is clear.  Eyes:     Extraocular Movements: Extraocular movements intact.     Pupils: Pupils are equal, round, and reactive to light.  Cardiovascular:     Rate and Rhythm: Normal rate and regular rhythm.     Pulses: Normal pulses.     Heart sounds: Murmur heard.  Pulmonary:     Effort: Pulmonary effort is normal. No respiratory distress.     Breath sounds: Normal breath sounds.  Abdominal:     General: Bowel sounds are normal. There is no distension.     Palpations: Abdomen is soft.     Tenderness: There is no abdominal tenderness.  Musculoskeletal:        General: No swelling or deformity.  Skin:    General: Skin is warm and dry.  Neurological:     General: No focal deficit present.     Mental Status: Mental status is at baseline.    Labs on Admission: I have personally reviewed following labs and imaging studies  CBC: Recent Labs  Lab 11/21/21 1406  WBC 11.2*  HGB 12.7  HCT 36.9  MCV 72.1*  PLT [id number]    Basic Metabolic Panel: Recent Labs  Lab 11/21/21 1406  NA 141  K 4.0  CL 111  CO2 22  GLUCOSE 108*  BUN 9  CREATININE 0.67  CALCIUM 9.4    GFR: CrCl cannot be calculated (Unknown ideal weight.).  Liver Function Tests: No results for input(s): "AST", "ALT", "ALKPHOS", "BILITOT", "PROT", "ALBUMIN" in the last 168 hours.  Urine analysis:    Component Value Date/Time   COLORURINE YELLOW  11/21/2021 1323   APPEARANCEUR CLEAR 11/21/2021 1323   LABSPEC 1.016 11/21/2021 1323   PHURINE 5.0 11/21/2021 1323   GLUCOSEU NEGATIVE 11/21/2021 [place] 11/21/2021 [place] 11/21/2021 1323   BILIRUBINUR Negative 04/27/2019 0935   KETONESUR NEGATIVE 11/21/2021 1323   PROTEINUR 100 (A) 11/21/2021 1323   UROBILINOGEN 1.0 04/27/2019 0935   NITRITE NEGATIVE 11/21/2021 1323   LEUKOCYTESUR NEGATIVE 11/21/2021 1323    Radiological Exams on Admission: DG Chest Portable 1 View  Result Date: 11/21/2021 CLINICAL  DATA:  Chest pain EXAM: PORTABLE CHEST 1 VIEW COMPARISON:  04/02/21 FINDINGS: No pleural effusion. No pneumothorax. Cardiomegaly. No focal airspace opacity. Prominent interstitial opacities, could be seen in the setting of pulmonary venous congestion without overt pulmonary edema. No displaced rib fractures. Visualized upper abdomen is unremarkable. IMPRESSION: 1. No radiographic finding to explain chest pain. 2. Cardiomegaly with pulmonary venous congestion. No overt pulmonary edema. No focal airspace opacity. Electronically Signed   By: Marin Roberts M.D.   On: 11/21/2021 16:13   CT Angio Chest PE W and/or Wo Contrast  Result Date: 11/21/2021 CLINICAL DATA:  Chest pain, pulmonary embolism suspected. EXAM: CT ANGIOGRAPHY CHEST WITH CONTRAST TECHNIQUE: Multidetector CT imaging of the chest was performed using the standard protocol during bolus administration of intravenous contrast. Multiplanar CT image reconstructions and MIPs were obtained to evaluate the vascular anatomy. RADIATION DOSE REDUCTION: This exam was performed according to the departmental dose-optimization program which includes automated exposure control, adjustment of the mA and/or kV according to patient size and/or use of iterative reconstruction technique. CONTRAST:  158mL OMNIPAQUE IOHEXOL 350 MG/ML SOLN COMPARISON:  CT examination dated April 02, 2021 FINDINGS: Cardiovascular: Satisfactory  opacification of the pulmonary arteries to the segmental level. No evidence of pulmonary embolism. Evaluation of distal segmental and subsegmental pulmonary arteries is limited due to phase of contrast enhancement and respiratory motion. Heart is mildly enlarged. Main pulmonary trunk is dilated measuring up to 3.2 cm concerning for pulmonary arterial hypertension. No pericardial effusion. Mediastinum/Nodes: No enlarged mediastinal, hilar, or axillary lymph nodes. Thyroid gland, trachea, and esophagus demonstrate no significant findings. Lungs/Pleura: No focal consolidation or pleural effusion. Low lung volumes. Bibasilar dependent atelectasis. 6 mm nodule in the superior aspect of the right middle lobe (series 6, image 58). Upper Abdomen: No acute abnormality. Musculoskeletal: No chest wall abnormality. No acute or significant osseous findings. Review of the MIP images confirms the above findings. IMPRESSION: 1. No evidence of central pulmonary embolism. Evaluation of distal segmental and subsegmental pulmonary arteries is limited due to phase of contrast enhancement and respiratory motion. 2. Mild cardiomegaly. 3. Main pulmonary trunk is dilated measuring up to 3.2 cm concerning for pulmonary arterial hypertension. 4. Low lung volumes with bibasilar dependent atelectasis. No focal consolidation or pleural effusion. 5. 6 mm nodule in the superior aspect of the right middle lobe, unchanged. Electronically Signed   By: Keane Police D.O.   On: 11/21/2021 15:44    EKG: Independently reviewed.  Sinus rhythm at 96 bpm.  Nonspecific T wave flattening.  Mild baseline wander.  Assessment/Plan Active Problems:   Morbid obesity with body mass index (BMI) of 45.0 to 49.9 in adult [facility])   Hypoxia   Sickle cell pain crisis ([place])   Pulmonary hypertension ([facility])   Sickle cell pain crisis > Patient presenting with 2 days of pain similar to her sickle cell pain in the past. > She is not able to manage her pain at home  with her home pain medication.  She has been taking her home oxycodone but ran out of her home p.o. Dilaudid yesterday. > She called her sickle cell clinic yesterday after running out of Dilaudid to attempt to get an infusion however they did not have any spots available. > Despite initial interventions multiple rounds of Dilaudid ED pain not well controlled.  She also is noted to have some hypoxia with ambulation and pulmonary hypertension on CTA. > Hemoglobin stable and normal in the ED.  Reticulocyte count is elevated to 4.6.  Mild leukocytosis likely reactive in the setting. > Received Dilaudid, Toradol, Zofran, half-normal saline, Benadryl in the ED. - Monitor on telemetry - PCA pump when she arrived to the floor - Continue with Dilaudid 2 mg every hour as needed in the ED -  As needed Toradol - Continue home oxycodone - Continue with half-normal saline - Continue with as needed Zofran, Benadryl - Continue home folate - Continue home Oxbyrta (which she will need to bring from home)   Pulmonary hypertension Hypoxia on exertion > Evidence of pulmonary hypertension on CTA in the ED.  Also noted to have hypoxia on exertion which quickly recovers at rest. > Pulm hypertension could be multifactorial.  She does have sickle cell as above which could be contributing factor no clots noted on CTA of the distal exam is limited. > She also reports symptoms that could be consistent with obstructive sleep apnea (waking up short of breath and significant snoring) - Supplemental oxygen as needed - We will need to walk and check saturations prior to discharge - Echocardiogram to verify, no pulmonary hypertension on echo from 2021.  Obesity - Noted  DVT prophylaxis: Lovenox Code Status:   Full Family Communication:  None on admission.  She states her family is aware of her admission. Disposition Plan:   Patient is from:  Home  Anticipated DC to:  Home  Anticipated DC date:  1 to 2  days  Anticipated DC barriers: None  Consults called:  None Admission status:  Observation, telemetry  Severity of Illness: The appropriate patient status for this patient is OBSERVATION. Observation status is judged to be reasonable and necessary in order to provide the required intensity of service to ensure the patient's safety. The patient's presenting symptoms, physical exam findings, and initial radiographic and laboratory data in the context of their medical condition is felt to place them at decreased risk for further clinical deterioration. Furthermore, it is anticipated that the patient will be medically stable for discharge from the hospital within 2 midnights of admission.    Marcelyn Bruins MD [facility]  How to contact the [facility] Attending or Consulting provider [place] or covering provider during after hours [place], for this patient?   Check the care team in [facility] and look for a) attending/consulting [facility] provider listed and b) the [facility] team listed Log into [email or web address] and use [place]'s universal password to access. If you do not have the password, please contact the hospital operator. Locate the [facility] provider you are looking for under [facility] and page to a number that you can be directly reached. If you still have difficulty reaching the provider, please page the [facility] (Director on Call) for the Hospitalists listed on amion for assistance.  11/21/2021, 7:13 PM

## 2021-11-22 ENCOUNTER — Inpatient Hospital Stay (HOSPITAL_COMMUNITY): Payer: Medicare Other

## 2021-11-22 DIAGNOSIS — E538 Deficiency of other specified B group vitamins: Secondary | ICD-10-CM | POA: Diagnosis present

## 2021-11-22 DIAGNOSIS — E559 Vitamin D deficiency, unspecified: Secondary | ICD-10-CM | POA: Diagnosis present

## 2021-11-22 DIAGNOSIS — I272 Pulmonary hypertension, unspecified: Secondary | ICD-10-CM

## 2021-11-22 DIAGNOSIS — R053 Chronic cough: Secondary | ICD-10-CM | POA: Diagnosis present

## 2021-11-22 DIAGNOSIS — Z8701 Personal history of pneumonia (recurrent): Secondary | ICD-10-CM | POA: Diagnosis not present

## 2021-11-22 DIAGNOSIS — D638 Anemia in other chronic diseases classified elsewhere: Secondary | ICD-10-CM | POA: Diagnosis present

## 2021-11-22 DIAGNOSIS — Z8616 Personal history of COVID-19: Secondary | ICD-10-CM | POA: Diagnosis not present

## 2021-11-22 DIAGNOSIS — I2721 Secondary pulmonary arterial hypertension: Secondary | ICD-10-CM | POA: Diagnosis present

## 2021-11-22 DIAGNOSIS — D72829 Elevated white blood cell count, unspecified: Secondary | ICD-10-CM | POA: Diagnosis present

## 2021-11-22 DIAGNOSIS — Z6841 Body Mass Index (BMI) 40.0 and over, adult: Secondary | ICD-10-CM | POA: Diagnosis not present

## 2021-11-22 DIAGNOSIS — Z832 Family history of diseases of the blood and blood-forming organs and certain disorders involving the immune mechanism: Secondary | ICD-10-CM | POA: Diagnosis not present

## 2021-11-22 DIAGNOSIS — Z20822 Contact with and (suspected) exposure to covid-19: Secondary | ICD-10-CM | POA: Diagnosis present

## 2021-11-22 DIAGNOSIS — Z713 Dietary counseling and surveillance: Secondary | ICD-10-CM | POA: Diagnosis not present

## 2021-11-22 DIAGNOSIS — Z8249 Family history of ischemic heart disease and other diseases of the circulatory system: Secondary | ICD-10-CM | POA: Diagnosis not present

## 2021-11-22 DIAGNOSIS — Z833 Family history of diabetes mellitus: Secondary | ICD-10-CM | POA: Diagnosis not present

## 2021-11-22 DIAGNOSIS — R911 Solitary pulmonary nodule: Secondary | ICD-10-CM | POA: Diagnosis present

## 2021-11-22 DIAGNOSIS — F32A Depression, unspecified: Secondary | ICD-10-CM | POA: Diagnosis present

## 2021-11-22 DIAGNOSIS — R0609 Other forms of dyspnea: Secondary | ICD-10-CM | POA: Diagnosis present

## 2021-11-22 DIAGNOSIS — Z79899 Other long term (current) drug therapy: Secondary | ICD-10-CM | POA: Diagnosis not present

## 2021-11-22 DIAGNOSIS — F1120 Opioid dependence, uncomplicated: Secondary | ICD-10-CM | POA: Diagnosis present

## 2021-11-22 DIAGNOSIS — D5700 Hb-SS disease with crisis, unspecified: Secondary | ICD-10-CM | POA: Diagnosis present

## 2021-11-22 DIAGNOSIS — Z98891 History of uterine scar from previous surgery: Secondary | ICD-10-CM | POA: Diagnosis not present

## 2021-11-22 DIAGNOSIS — R0902 Hypoxemia: Secondary | ICD-10-CM | POA: Diagnosis present

## 2021-11-22 DIAGNOSIS — E662 Morbid (severe) obesity with alveolar hypoventilation: Secondary | ICD-10-CM | POA: Diagnosis present

## 2021-11-22 DIAGNOSIS — G894 Chronic pain syndrome: Secondary | ICD-10-CM | POA: Diagnosis present

## 2021-11-22 LAB — ECHOCARDIOGRAM COMPLETE
AR max vel: 3.96 cm2
AV Area VTI: 4.32 cm2
AV Area mean vel: 3.99 cm2
AV Mean grad: 5 mmHg
AV Peak grad: 9.6 mmHg
Ao pk vel: 1.55 m/s
Area-P 1/2: 4.06 cm2
S' Lateral: 3.1 cm

## 2021-11-22 MED ORDER — MENTHOL 3 MG MT LOZG: 1.0000 | LOZENGE | OROMUCOSAL | Status: DC | PRN

## 2021-11-22 MED ORDER — ALBUTEROL SULFATE (2.5 MG/3ML) 0.083% IN NEBU
2.5000 mg | INHALATION_SOLUTION | Freq: Four times a day (QID) | RESPIRATORY_TRACT | Status: DC | PRN
  Administered 2021-11-23: 2.5 mg via RESPIRATORY_TRACT
  Filled 2021-11-22: qty 3

## 2021-11-22 MED ORDER — PROCHLORPERAZINE EDISYLATE 10 MG/2ML IJ SOLN: 10.0000 mg | Freq: Four times a day (QID) | INTRAMUSCULAR | Status: DC | PRN

## 2021-11-22 MED ORDER — HYDROMORPHONE 1 MG/ML IV SOLN
INTRAVENOUS | Status: DC
  Administered 2021-11-22: 5 mg via INTRAVENOUS
  Administered 2021-11-22: 9.5 mg via INTRAVENOUS
  Administered 2021-11-22: 11.5 mg via INTRAVENOUS
  Administered 2021-11-22: 9 mg via INTRAVENOUS
  Administered 2021-11-22: 30 mg via INTRAVENOUS
  Administered 2021-11-23: 3 mg via INTRAVENOUS
  Administered 2021-11-23: 9.5 mg via INTRAVENOUS
  Administered 2021-11-23: 15 mg via INTRAVENOUS
  Administered 2021-11-23: 8.5 mg via INTRAVENOUS
  Administered 2021-11-24: 6.5 mg via INTRAVENOUS
  Administered 2021-11-24: 16 mg via INTRAVENOUS
  Administered 2021-11-24: 4.5 mg via INTRAVENOUS
  Administered 2021-11-24: 15.5 mg via INTRAVENOUS
  Administered 2021-11-24: 6.5 mg via INTRAVENOUS
  Administered 2021-11-24: 8 mg via INTRAVENOUS
  Administered 2021-11-25 (×2): 0.5 mg via INTRAVENOUS
  Filled 2021-11-22 (×5): qty 30

## 2021-11-22 NOTE — Progress Notes (Signed)
Subjective: Stephanie Burch is a 41 year old female is a 41 year old female with a medical history significant for sickle cell disease, chronic pain syndrome, opiate dependence and tolerance, anemia of chronic disease, and morbid obesity was admitted for sickle cell pain crisis. Patient continues to complain of pain primarily to low back and lower extremities.  She rates her pain as 8/10.  She denies headache, chest pain, shortness of breath, urinary symptoms, nausea, vomiting, or diarrhea.  Patient endorses persistent cough.  Objective:  Vital signs in last 24 hours:  Vitals:   11/22/21 1349 11/22/21 1600 11/22/21 2053 11/22/21 2059  BP: 134/62  130/78   Pulse: 90  95   Resp: 16 18 16 17   Temp: 98.4 F (36.9 C)  98.1 F (36.7 C)   TempSrc:   Oral   SpO2: 100% 99% 99% 100%    Intake/Output from previous day:   Intake/Output Summary (Last 24 hours) at 11/22/2021 2324 Last data filed at 11/22/2021 1419 Gross per 24 hour  Intake 360 ml  Output --  Net 360 ml    Physical Exam: General: Alert, awake, oriented x3, in no acute distress.  HEENT: [place]/AT PEERL, EOMI Neck: Trachea midline,  no masses, no thyromegal,y no JVD, no carotid bruit OROPHARYNX:  Moist, No exudate/ erythema/lesions.  Heart: Regular rate and rhythm, without murmurs, rubs, gallops, PMI non-displaced, no heaves or thrills on palpation.  Lungs: Clear to auscultation, no wheezing or rhonchi noted. No increased vocal fremitus resonant to percussion  Abdomen: Soft, nontender, nondistended, positive bowel sounds, no masses no hepatosplenomegaly noted..  Neuro: No focal neurological deficits noted cranial nerves II through XII grossly intact. DTRs 2+ bilaterally upper and lower extremities. Strength 5 out of 5 in bilateral upper and lower extremities. Musculoskeletal: No warm swelling or erythema around joints, no spinal tenderness noted. Psychiatric: Patient alert and oriented x3, good insight and cognition, good recent to  remote recall. Lymph node survey: No cervical axillary or inguinal lymphadenopathy noted.  Lab Results:  Basic Metabolic Panel:    Component Value Date/Time   NA 141 11/21/2021 1406   NA 142 09/11/2017 1431   K 4.0 11/21/2021 1406   CL 111 11/21/2021 1406   CO2 22 11/21/2021 1406   BUN 9 11/21/2021 1406   BUN 8 09/11/2017 1431   CREATININE 0.67 11/21/2021 1406   GLUCOSE 108 (H) 11/21/2021 1406   CALCIUM 9.4 11/21/2021 1406   CBC:    Component Value Date/Time   WBC 11.2 (H) 11/21/2021 1406   HGB 12.7 11/21/2021 1406   HGB 13.0 09/11/2017 1431   HCT 36.9 11/21/2021 1406   HCT 38.4 09/11/2017 1431   PLT 307 11/21/2021 1406   PLT 258 09/11/2017 1431   MCV 72.1 (L) 11/21/2021 1406   MCV 75 (L) 09/11/2017 1431   NEUTROABS 4.8 11/07/2021 0943   NEUTROABS 3.9 09/11/2017 1431   LYMPHSABS 2.3 11/07/2021 0943   LYMPHSABS 2.2 09/11/2017 1431   MONOABS 0.7 11/07/2021 0943   EOSABS 0.3 11/07/2021 0943   EOSABS 0.1 09/11/2017 1431   BASOSABS 0.0 11/07/2021 0943   BASOSABS 0.0 09/11/2017 1431    Recent Results (from the past 240 hour(s))  SARS Coronavirus 2 by RT PCR (hospital order, [facility]) *cepheid single result test* Anterior Nasal Swab     Status: None   Collection Time: 11/21/21  2:43 PM   Specimen: Anterior Nasal Swab  Result Value Ref Range Status   SARS Coronavirus 2 by RT PCR  NEGATIVE NEGATIVE Final    Comment: (NOTE) SARS-CoV-2 target nucleic acids are NOT DETECTED.  The SARS-CoV-2 RNA is generally detectable in upper and lower respiratory specimens during the acute phase of infection. The lowest concentration of SARS-CoV-2 viral copies this assay can detect is 250 copies / mL. A negative result does not preclude SARS-CoV-2 infection and should not be used as the sole basis for treatment or other patient management decisions.  A negative result may occur with improper specimen collection / handling, submission of specimen other than  nasopharyngeal swab, presence of viral mutation(s) within the areas targeted by this assay, and inadequate number of viral copies (<250 copies / mL). A negative result must be combined with clinical observations, patient history, and epidemiological information.  Fact Sheet for Patients:   RoadLapTop.co.za  Fact Sheet for Healthcare Providers: http://kim-miller.com/  This test is not yet approved or  cleared by the [place] FDA and has been authorized for detection and/or diagnosis of SARS-CoV-2 by FDA under an Emergency Use Authorization (EUA).  This EUA will remain in effect (meaning this test can be used) for the duration of the COVID-19 declaration under Section 564(b)(1) of the Act, 21 U.S.C. section 360bbb-3(b)(1), unless the authorization is terminated or revoked sooner.  Performed at [facility], [address] [place], [place] [address]     Studies/Results: ECHOCARDIOGRAM COMPLETE  Result Date: 11/22/2021    ECHOCARDIOGRAM REPORT   Patient Name:   Stephanie Burch Date of Exam: 11/22/2021 Medical Rec #:  [id number]     Height:       65.0 in Accession #:    [id number]    Weight:       320.0 lb Date of Birth:  May 14, 1980    BSA:          2.415 m Patient Age:    40 years      BP:           134/62 mmHg Patient Gender: F             HR:           82 bpm. Exam Location:  Inpatient Procedure: 2D Echo, Color Doppler and Cardiac Doppler Indications:    Pulmonary Hypertension  History:        Patient has prior history of Echocardiogram examinations, most                 recent 11/15/2019. Arrythmias:Tachycardia.  Sonographer:    Gaynell Face Referring Phys: [id number] ALEXANDER B MELVIN IMPRESSIONS  1. Left ventricular ejection fraction, by estimation, is 60 to 65%. The left ventricle has normal function. The left ventricle has no regional wall motion abnormalities. Left ventricular diastolic parameters are indeterminate.   2. Right ventricular systolic function is normal. The right ventricular size is normal.  3. The mitral valve is normal in structure. Trivial mitral valve regurgitation.  4. The aortic valve is tricuspid. Aortic valve regurgitation is not visualized. Aortic valve sclerosis/calcification is present, without any evidence of aortic stenosis. FINDINGS  Left Ventricle: Left ventricular ejection fraction, by estimation, is 60 to 65%. The left ventricle has normal function. The left ventricle has no regional wall motion abnormalities. The left ventricular internal cavity size was normal in size. There is  no left ventricular hypertrophy. Left ventricular diastolic parameters are indeterminate. Right Ventricle: The right ventricular size is normal. No increase in right ventricular wall thickness. Right ventricular systolic function is normal. Left Atrium: Left atrial size was normal in  size. Right Atrium: Right atrial size was normal in size. Pericardium: There is no evidence of pericardial effusion. Mitral Valve: The mitral valve is normal in structure. Trivial mitral valve regurgitation. Tricuspid Valve: The tricuspid valve is normal in structure. Tricuspid valve regurgitation is trivial. Aortic Valve: The aortic valve is tricuspid. Aortic valve regurgitation is not visualized. Aortic valve sclerosis/calcification is present, without any evidence of aortic stenosis. Aortic valve mean gradient measures 5.0 mmHg. Aortic valve peak gradient measures 9.6 mmHg. Aortic valve area, by VTI measures 4.32 cm. Pulmonic Valve: The pulmonic valve was not well visualized. Pulmonic valve regurgitation is not visualized. No evidence of pulmonic stenosis. Aorta: The aortic root is normal in size and structure. IAS/Shunts: No atrial level shunt detected by color flow Doppler.  LEFT VENTRICLE PLAX 2D LVIDd:         4.80 cm   Diastology LVIDs:         3.10 cm   LV e' medial:    6.09 cm/s LV PW:         1.10 cm   LV E/e' medial:  17.4 LV IVS:         1.00 cm   LV e' lateral:   5.66 cm/s LVOT diam:     2.80 cm   LV E/e' lateral: 18.7 LV SV:         106 LV SV Index:   44 LVOT Area:     6.16 cm  RIGHT VENTRICLE TAPSE (M-mode): 2.3 cm LEFT ATRIUM             Index        RIGHT ATRIUM           Index LA diam:        4.00 cm 1.66 cm/m   RA Area:     12.00 cm LA Vol (A2C):   76.7 ml 31.75 ml/m  RA Volume:   24.30 ml  10.06 ml/m LA Vol (A4C):   57.4 ml 23.76 ml/m LA Biplane Vol: 67.7 ml 28.03 ml/m  AORTIC VALVE AV Area (Vmax):    3.96 cm AV Area (Vmean):   3.99 cm AV Area (VTI):     4.32 cm AV Vmax:           155.00 cm/s AV Vmean:          97.700 cm/s AV VTI:            0.245 m AV Peak Grad:      9.6 mmHg AV Mean Grad:      5.0 mmHg LVOT Vmax:         99.70 cm/s LVOT Vmean:        63.300 cm/s LVOT VTI:          0.172 m LVOT/AV VTI ratio: 0.70  AORTA Ao Root diam: 3.50 cm Ao Asc diam:  3.00 cm MITRAL VALVE MV Area (PHT): 4.06 cm     SHUNTS MV Decel Time: 187 msec     Systemic VTI:  0.17 m MV E velocity: 106.00 cm/s  Systemic Diam: 2.80 cm MV A velocity: 108.00 cm/s MV E/A ratio:  0.98 Dietrich Pates MD Electronically signed by Dietrich Pates MD Signature Date/Time: 11/22/2021/4:03:46 PM    Final    DG Chest Portable 1 View  Result Date: 11/21/2021 CLINICAL DATA:  Chest pain EXAM: PORTABLE CHEST 1 VIEW COMPARISON:  04/02/21 FINDINGS: No pleural effusion. No pneumothorax. Cardiomegaly. No focal airspace opacity. Prominent interstitial opacities, could be seen in the setting  of pulmonary venous congestion without overt pulmonary edema. No displaced rib fractures. Visualized upper abdomen is unremarkable. IMPRESSION: 1. No radiographic finding to explain chest pain. 2. Cardiomegaly with pulmonary venous congestion. No overt pulmonary edema. No focal airspace opacity. Electronically Signed   By: Marin Roberts M.D.   On: 11/21/2021 16:13   CT Angio Chest PE W and/or Wo Contrast  Result Date: 11/21/2021 CLINICAL DATA:  Chest pain, pulmonary embolism  suspected. EXAM: CT ANGIOGRAPHY CHEST WITH CONTRAST TECHNIQUE: Multidetector CT imaging of the chest was performed using the standard protocol during bolus administration of intravenous contrast. Multiplanar CT image reconstructions and MIPs were obtained to evaluate the vascular anatomy. RADIATION DOSE REDUCTION: This exam was performed according to the departmental dose-optimization program which includes automated exposure control, adjustment of the mA and/or kV according to patient size and/or use of iterative reconstruction technique. CONTRAST:  142mL OMNIPAQUE IOHEXOL 350 MG/ML SOLN COMPARISON:  CT examination dated April 02, 2021 FINDINGS: Cardiovascular: Satisfactory opacification of the pulmonary arteries to the segmental level. No evidence of pulmonary embolism. Evaluation of distal segmental and subsegmental pulmonary arteries is limited due to phase of contrast enhancement and respiratory motion. Heart is mildly enlarged. Main pulmonary trunk is dilated measuring up to 3.2 cm concerning for pulmonary arterial hypertension. No pericardial effusion. Mediastinum/Nodes: No enlarged mediastinal, hilar, or axillary lymph nodes. Thyroid gland, trachea, and esophagus demonstrate no significant findings. Lungs/Pleura: No focal consolidation or pleural effusion. Low lung volumes. Bibasilar dependent atelectasis. 6 mm nodule in the superior aspect of the right middle lobe (series 6, image 58). Upper Abdomen: No acute abnormality. Musculoskeletal: No chest wall abnormality. No acute or significant osseous findings. Review of the MIP images confirms the above findings. IMPRESSION: 1. No evidence of central pulmonary embolism. Evaluation of distal segmental and subsegmental pulmonary arteries is limited due to phase of contrast enhancement and respiratory motion. 2. Mild cardiomegaly. 3. Main pulmonary trunk is dilated measuring up to 3.2 cm concerning for pulmonary arterial hypertension. 4. Low lung volumes with  bibasilar dependent atelectasis. No focal consolidation or pleural effusion. 5. 6 mm nodule in the superior aspect of the right middle lobe, unchanged. Electronically Signed   By: Keane Police D.O.   On: 11/21/2021 15:44    Medications: Scheduled Meds:  enoxaparin (LOVENOX) injection  40 mg Subcutaneous G29B   folic acid  1 mg Oral Daily   HYDROmorphone   Intravenous Q4H   ketorolac  15 mg Intravenous Q6H   pantoprazole  40 mg Oral Daily   Or   pantoprazole (PROTONIX) IV  40 mg Intravenous Daily   senna-docusate  1 tablet Oral BID   voxelotor  1,500 mg Oral Q1500   Continuous Infusions:  sodium chloride 10 mL/hr at 11/22/21 1238   PRN Meds:.albuterol, diphenhydrAMINE, menthol-cetylpyridinium, naloxone **AND** sodium chloride flush, ondansetron, oxycodone, polyethylene glycol  Consultants: none  Procedures: none  Antibiotics: none  Assessment/Plan: Active Problems:   Morbid obesity with body mass index (BMI) of 45.0 to 49.9 in adult (HCC)   Hypoxia   Sickle cell pain crisis ([place])   Pulmonary hypertension (HCC)   Sickle cell disease with pain crisis: Continue IV Dilaudid PCA without any changes Hold home Dilaudid and oxycodone Decrease IV fluids to KVO Toradol 15 mg every 6 hours for total of 5 days.  Chronic pain syndrome: We will restart home medications as pain intensity improves  Anemia of chronic disease: Patient's hemoglobin is stable and consistent with her baseline, there is no clinical indication for  blood transfusion at this time.  Morbid obesity: Patient counseled at length.  Continue heart healthy diet.   Code Status: Full Code Family Communication: N/A Disposition Plan: Not yet ready for discharge  Vanessa Alesi Rennis Petty  APRN, MSN, FNP-C [facility] [address] [place], [place] [address] [phone]  If 7PM-7AM, please contact night-coverage.  11/22/2021, 11:24 PM  LOS: 0 days

## 2021-11-23 DIAGNOSIS — D5700 Hb-SS disease with crisis, unspecified: Secondary | ICD-10-CM | POA: Diagnosis not present

## 2021-11-23 DIAGNOSIS — R0902 Hypoxemia: Secondary | ICD-10-CM | POA: Diagnosis not present

## 2021-11-23 DIAGNOSIS — I272 Pulmonary hypertension, unspecified: Secondary | ICD-10-CM | POA: Diagnosis not present

## 2021-11-23 NOTE — Progress Notes (Signed)
SICKLE CELL SERVICE PROGRESS NOTE  Stephanie Burch [id number] DOB: [birth year] DOA: 11/21/2021 PCP: Pcp, No  Assessment/Plan: Active Problems:   Morbid obesity with body mass index (BMI) of 45.0 to 49.9 in adult [facility])   Hypoxia   Sickle cell pain crisis ([place])   Pulmonary hypertension (HCC)  Sickle cell pain crisis: On Dilaudid PCA Toradol and patient placed on her OxyContin 30 mg twice daily.  Continue to monitor. Acute hypoxemia: Could be related to sickle cell crisis or possibly obesity hypoventilation syndrome.  Patient also has pulmonary hypertension.  Continue oxygenation and attempt to titrate off oxygen. Morbid obesity: Dietary counseling. Chronic pain syndrome: Patient has been on Xtampza with her short acting pain medications at home.  Code Status: Full code Family Communication: No family at bedside Disposition Plan: [place]  Pager [phone]. If 7PM-7AM, please contact night-coverage.  11/23/2021, 11:34 AM  LOS: 1 day   Brief narrative: Stephanie Burch is a 41 y.o. female with medical history significant of sickle cell disease, obesity presenting with sickle cell pain.   Patient reports 2 days of bilateral lower extremity and left arm pain similar to previous sickle cell pain.  She also reports a sore throat, weakness, shortness of breath chest tightness but no real chest pain.  Also reports ongoing chronic intermittent stomach cramping.  She states she ran out of her home Dilaudid yesterday and her home oxycodone has not been controlling her pain.   She denies fevers, chills, abdominal pain, constipation, diarrhea, nausea, vomiting.  Consultants: None  Procedures: None  Antibiotics: None  HPI/Subjective: Patient is still requiring oxygen.  Pain is 9 out of 10.  No fever or chills no nausea vomiting or diarrhea.  Objective: Vitals:   11/23/21 0416 11/23/21 0459 11/23/21 0903 11/23/21 1044  BP: (!) 140/78     Pulse: (!) 106     Resp: 18 18 16 16    Temp: 98.4 F (36.9 C)     TempSrc: Oral     SpO2: 95% 97% 97% 98%   Weight change:   Intake/Output Summary (Last 24 hours) at 11/23/2021 1134 Last data filed at 11/23/2021 0903 Gross per 24 hour  Intake 1733.63 ml  Output --  Net 1733.63 ml    General: Alert, awake, oriented x3, in no acute distress.  HEENT: [place]/AT PEERL, EOMI Neck: Trachea midline,  no masses, no thyromegal,y no JVD, no carotid bruit OROPHARYNX:  Moist, No exudate/ erythema/lesions.  Heart: Regular rate and rhythm, without murmurs, rubs, gallops, PMI non-displaced, no heaves or thrills on palpation.  Lungs: Clear to auscultation, no wheezing or rhonchi noted. No increased vocal fremitus resonant to percussion  Abdomen: Soft, nontender, nondistended, positive bowel sounds, no masses no hepatosplenomegaly noted..  Neuro: No focal neurological deficits noted cranial nerves II through XII grossly intact. DTRs 2+ bilaterally upper and lower extremities. Strength 5 out of 5 in bilateral upper and lower extremities. Musculoskeletal: No warm swelling or erythema around joints, no spinal tenderness noted. Psychiatric: Patient alert and oriented x3, good insight and cognition, good recent to remote recall. Lymph node survey: No cervical axillary or inguinal lymphadenopathy noted.   Data Reviewed: Basic Metabolic Panel: Recent Labs  Lab 11/21/21 1406  NA 141  K 4.0  CL 111  CO2 22  GLUCOSE 108*  BUN 9  CREATININE 0.67  CALCIUM 9.4   Liver Function Tests: No results for input(s): "AST", "ALT", "ALKPHOS", "BILITOT", "PROT", "ALBUMIN" in the last 168 hours. No results for input(s): "LIPASE", "AMYLASE" in  the last 168 hours. No results for input(s): "AMMONIA" in the last 168 hours. CBC: Recent Labs  Lab 11/21/21 1406  WBC 11.2*  HGB 12.7  HCT 36.9  MCV 72.1*  PLT 307   Cardiac Enzymes: No results for input(s): "CKTOTAL", "CKMB", "CKMBINDEX", "TROPONINI" in the last 168 hours. BNP (last 3 results) Recent  Labs    11/21/21 1403  BNP 17.5    ProBNP (last 3 results) No results for input(s): "PROBNP" in the last 8760 hours.  CBG: No results for input(s): "GLUCAP" in the last 168 hours.  Recent Results (from the past 240 hour(s))  SARS Coronavirus 2 by RT PCR (hospital order, performed in [facility] hospital lab) *cepheid single result test* Anterior Nasal Swab     Status: None   Collection Time: 11/21/21  2:43 PM   Specimen: Anterior Nasal Swab  Result Value Ref Range Status   SARS Coronavirus 2 by RT PCR NEGATIVE NEGATIVE Final    Comment: (NOTE) SARS-CoV-2 target nucleic acids are NOT DETECTED.  The SARS-CoV-2 RNA is generally detectable in upper and lower respiratory specimens during the acute phase of infection. The lowest concentration of SARS-CoV-2 viral copies this assay can detect is 250 copies / mL. A negative result does not preclude SARS-CoV-2 infection and should not be used as the sole basis for treatment or other patient management decisions.  A negative result may occur with improper specimen collection / handling, submission of specimen other than nasopharyngeal swab, presence of viral mutation(s) within the areas targeted by this assay, and inadequate number of viral copies (<250 copies / mL). A negative result must be combined with clinical observations, patient history, and epidemiological information.  Fact Sheet for Patients:   https://www.patel.info/  Fact Sheet for Healthcare Providers: https://hall.com/  This test is not yet approved or  cleared by the [place] FDA and has been authorized for detection and/or diagnosis of SARS-CoV-2 by FDA under an Emergency Use Authorization (EUA).  This EUA will remain in effect (meaning this test can be used) for the duration of the COVID-19 declaration under Section 564(b)(1) of the Act, 21 U.S.C. section 360bbb-3(b)(1), unless the authorization is terminated  or revoked sooner.  Performed at [facility], [place] [address], [place], [place] [address]      Studies: ECHOCARDIOGRAM COMPLETE  Result Date: 11/22/2021    ECHOCARDIOGRAM REPORT   Patient Name:   [facility] Cary Ecklund Date of Exam: 11/22/2021 Medical Rec #:  [id number]     Height:       65.0 in Accession #:    [id number]    Weight:       320.0 lb Date of Birth:  Aug 29, 1980    BSA:          2.415 m Patient Age:    41 years      BP:           134/62 mmHg Patient Gender: F             HR:           82 bpm. Exam Location:  Inpatient Procedure: 2D Echo, Color Doppler and Cardiac Doppler Indications:    Pulmonary Hypertension  History:        Patient has prior history of Echocardiogram examinations, most                 recent 11/15/2019. Arrythmias:Tachycardia.  Sonographer:    Memory Argue Referring Phys: [id number] [place]  1. Left ventricular  ejection fraction, by estimation, is 60 to 65%. The left ventricle has normal function. The left ventricle has no regional wall motion abnormalities. Left ventricular diastolic parameters are indeterminate.  2. Right ventricular systolic function is normal. The right ventricular size is normal.  3. The mitral valve is normal in structure. Trivial mitral valve regurgitation.  4. The aortic valve is tricuspid. Aortic valve regurgitation is not visualized. Aortic valve sclerosis/calcification is present, without any evidence of aortic stenosis. FINDINGS  Left Ventricle: Left ventricular ejection fraction, by estimation, is 60 to 65%. The left ventricle has normal function. The left ventricle has no regional wall motion abnormalities. The left ventricular internal cavity size was normal in size. There is  no left ventricular hypertrophy. Left ventricular diastolic parameters are indeterminate. Right Ventricle: The right ventricular size is normal. No increase in right ventricular wall thickness. Right ventricular systolic function is  normal. Left Atrium: Left atrial size was normal in size. Right Atrium: Right atrial size was normal in size. Pericardium: There is no evidence of pericardial effusion. Mitral Valve: The mitral valve is normal in structure. Trivial mitral valve regurgitation. Tricuspid Valve: The tricuspid valve is normal in structure. Tricuspid valve regurgitation is trivial. Aortic Valve: The aortic valve is tricuspid. Aortic valve regurgitation is not visualized. Aortic valve sclerosis/calcification is present, without any evidence of aortic stenosis. Aortic valve mean gradient measures 5.0 mmHg. Aortic valve peak gradient measures 9.6 mmHg. Aortic valve area, by VTI measures 4.32 cm. Pulmonic Valve: The pulmonic valve was not well visualized. Pulmonic valve regurgitation is not visualized. No evidence of pulmonic stenosis. Aorta: The aortic root is normal in size and structure. IAS/Shunts: No atrial level shunt detected by color flow Doppler.  LEFT VENTRICLE PLAX 2D LVIDd:         4.80 cm   Diastology LVIDs:         3.10 cm   LV e' medial:    6.09 cm/s LV PW:         1.10 cm   LV E/e' medial:  17.4 LV IVS:        1.00 cm   LV e' lateral:   5.66 cm/s LVOT diam:     2.80 cm   LV E/e' lateral: 18.7 LV SV:         106 LV SV Index:   44 LVOT Area:     6.16 cm  RIGHT VENTRICLE TAPSE (M-mode): 2.3 cm LEFT ATRIUM             Index        RIGHT ATRIUM           Index LA diam:        4.00 cm 1.66 cm/m   RA Area:     12.00 cm LA Vol (A2C):   76.7 ml 31.75 ml/m  RA Volume:   24.30 ml  10.06 ml/m LA Vol (A4C):   57.4 ml 23.76 ml/m LA Biplane Vol: 67.7 ml 28.03 ml/m  AORTIC VALVE AV Area (Vmax):    3.96 cm AV Area (Vmean):   3.99 cm AV Area (VTI):     4.32 cm AV Vmax:           155.00 cm/s AV Vmean:          97.700 cm/s AV VTI:            0.245 m AV Peak Grad:      9.6 mmHg AV Mean Grad:      5.0 mmHg  LVOT Vmax:         99.70 cm/s LVOT Vmean:        63.300 cm/s LVOT VTI:          0.172 m LVOT/AV VTI ratio: 0.70  AORTA Ao Root diam:  3.50 cm Ao Asc diam:  3.00 cm MITRAL VALVE MV Area (PHT): 4.06 cm     SHUNTS MV Decel Time: 187 msec     Systemic VTI:  0.17 m MV E velocity: 106.00 cm/s  Systemic Diam: 2.80 cm MV A velocity: 108.00 cm/s MV E/A ratio:  0.98 Dorris Carnes MD Electronically signed by Dorris Carnes MD Signature Date/Time: 11/22/2021/4:03:46 PM    Final    DG Chest Portable 1 View  Result Date: 11/21/2021 CLINICAL DATA:  Chest pain EXAM: PORTABLE CHEST 1 VIEW COMPARISON:  04/02/21 FINDINGS: No pleural effusion. No pneumothorax. Cardiomegaly. No focal airspace opacity. Prominent interstitial opacities, could be seen in the setting of pulmonary venous congestion without overt pulmonary edema. No displaced rib fractures. Visualized upper abdomen is unremarkable. IMPRESSION: 1. No radiographic finding to explain chest pain. 2. Cardiomegaly with pulmonary venous congestion. No overt pulmonary edema. No focal airspace opacity. Electronically Signed   By: Marin Roberts M.D.   On: 11/21/2021 16:13   CT Angio Chest PE W and/or Wo Contrast  Result Date: 11/21/2021 CLINICAL DATA:  Chest pain, pulmonary embolism suspected. EXAM: CT ANGIOGRAPHY CHEST WITH CONTRAST TECHNIQUE: Multidetector CT imaging of the chest was performed using the standard protocol during bolus administration of intravenous contrast. Multiplanar CT image reconstructions and MIPs were obtained to evaluate the vascular anatomy. RADIATION DOSE REDUCTION: This exam was performed according to the departmental dose-optimization program which includes automated exposure control, adjustment of the mA and/or kV according to patient size and/or use of iterative reconstruction technique. CONTRAST:  148mL OMNIPAQUE IOHEXOL 350 MG/ML SOLN COMPARISON:  CT examination dated April 02, 2021 FINDINGS: Cardiovascular: Satisfactory opacification of the pulmonary arteries to the segmental level. No evidence of pulmonary embolism. Evaluation of distal segmental and subsegmental pulmonary  arteries is limited due to phase of contrast enhancement and respiratory motion. Heart is mildly enlarged. Main pulmonary trunk is dilated measuring up to 3.2 cm concerning for pulmonary arterial hypertension. No pericardial effusion. Mediastinum/Nodes: No enlarged mediastinal, hilar, or axillary lymph nodes. Thyroid gland, trachea, and esophagus demonstrate no significant findings. Lungs/Pleura: No focal consolidation or pleural effusion. Low lung volumes. Bibasilar dependent atelectasis. 6 mm nodule in the superior aspect of the right middle lobe (series 6, image 58). Upper Abdomen: No acute abnormality. Musculoskeletal: No chest wall abnormality. No acute or significant osseous findings. Review of the MIP images confirms the above findings. IMPRESSION: 1. No evidence of central pulmonary embolism. Evaluation of distal segmental and subsegmental pulmonary arteries is limited due to phase of contrast enhancement and respiratory motion. 2. Mild cardiomegaly. 3. Main pulmonary trunk is dilated measuring up to 3.2 cm concerning for pulmonary arterial hypertension. 4. Low lung volumes with bibasilar dependent atelectasis. No focal consolidation or pleural effusion. 5. 6 mm nodule in the superior aspect of the right middle lobe, unchanged. Electronically Signed   By: Keane Police D.O.   On: 11/21/2021 15:44    Scheduled Meds:  enoxaparin (LOVENOX) injection  40 mg Subcutaneous [id number]   folic acid  1 mg Oral Daily   HYDROmorphone   Intravenous Q4H   ketorolac  15 mg Intravenous Q6H   pantoprazole  40 mg Oral Daily   Or   pantoprazole (PROTONIX) IV  40 mg Intravenous Daily   senna-docusate  1 tablet Oral BID   voxelotor  1,500 mg Oral Q1500   Continuous Infusions:  sodium chloride 10 mL/hr at 11/22/21 1238    Active Problems:   Morbid obesity with body mass index (BMI) of 45.0 to 49.9 in adult [facility])   Hypoxia   Sickle cell pain crisis ([place])   Pulmonary hypertension ([place])

## 2021-11-24 ENCOUNTER — Encounter (HOSPITAL_COMMUNITY): Payer: Self-pay | Admitting: Family Medicine

## 2021-11-24 DIAGNOSIS — D5700 Hb-SS disease with crisis, unspecified: Secondary | ICD-10-CM | POA: Diagnosis not present

## 2021-11-24 DIAGNOSIS — R0902 Hypoxemia: Secondary | ICD-10-CM | POA: Diagnosis not present

## 2021-11-24 DIAGNOSIS — I272 Pulmonary hypertension, unspecified: Secondary | ICD-10-CM | POA: Diagnosis not present

## 2021-11-24 NOTE — TOC Progression Note (Signed)
Transition of Care [facility]) - Progression Note    Patient Details  Name: Marlia Schewe MRN: [id number] Date of Birth: [birth year]  Transition of Care [facility]) CM/SW Contact  Henrietta Dine, RN Phone Number: 11/24/2021, 5:00 PM  Clinical Narrative:     Transition of Care [facility]) Screening Note   Patient Details  Name: Annslee Tercero Date of Birth: 12/17/1980   Transition of Care [facility]) CM/SW Contact:    Henrietta Dine, RN Phone Number: 11/24/2021, 5:00 PM    Transition of Care Department [facility]) has reviewed patient and no TOC needs have been identified at this time. We will continue to monitor patient advancement through interdisciplinary progression rounds. If new patient transition needs arise, please place a TOC consult.         Expected Discharge Plan and Services                                                 Social Determinants of Health (SDOH) Interventions    Readmission Risk Interventions    12/19/2020   12:15 PM 08/14/2020   10:14 AM 07/17/2020   11:13 AM  Readmission Risk Prevention Plan  Transportation Screening Complete Complete Complete  PCP or Specialist Appt within 3-5 Days  Complete   HRI or Home Care Consult  Complete Complete  Social Work Consult for [place] Planning/Counseling  Complete Complete  Palliative Care Screening  Not Applicable   Medication Review [employer]) Complete Complete Complete  HRI or Home Care Consult Complete    SW Recovery Care/Counseling Consult Complete    Palliative Care Screening Not [place] Not Applicable

## 2021-11-24 NOTE — Progress Notes (Signed)
SICKLE CELL SERVICE PROGRESS NOTE  Stephanie Burch [id number] DOB: Aug 03, 1980 DOA: 11/21/2021 PCP: Pcp, No  Assessment/Plan: Active Problems:   Morbid obesity with body mass index (BMI) of 45.0 to 49.9 in adult [facility])   Hypoxia   Sickle cell pain crisis ([place])   Pulmonary hypertension (HCC)  Sickle cell pain crisis: Patient slowly improving.  Pain much better.  Main issue is hypoxia.  On Dilaudid PCA Toradol and patient placed on her OxyContin 30 mg twice daily.  Continue to monitor. Acute hypoxemia: Slightly improved but still dyspnea on exertion with sats dropping into the 80s.  Could be related to sickle cell crisis or possibly obesity hypoventilation syndrome.  Patient also has pulmonary hypertension.  Continue oxygenation and attempt to titrate off oxygen.  Patient may end up going home with home O2. Morbid obesity: Dietary counseling. Chronic pain syndrome: Patient has been on Xtampza with her short acting pain medications at home.  Code Status: Full code Family Communication: No family at bedside Disposition Plan: [place]  Pager [phone]. If 7PM-7AM, please contact night-coverage.  11/24/2021, 7:05 PM  LOS: 2 days   Brief narrative: Stephanie Burch is a 41 y.o. female with medical history significant of sickle cell disease, obesity presenting with sickle cell pain.   Patient reports 2 days of bilateral lower extremity and left arm pain similar to previous sickle cell pain.  She also reports a sore throat, weakness, shortness of breath chest tightness but no real chest pain.  Also reports ongoing chronic intermittent stomach cramping.  She states she ran out of her home Dilaudid yesterday and her home oxycodone has not been controlling her pain.   She denies fevers, chills, abdominal pain, constipation, diarrhea, nausea, vomiting.  Consultants: None  Procedures: None  Antibiotics: None  HPI/Subjective: Patient is still requiring oxygen.  Pain is 6 out of  10.  No fever or chills no nausea vomiting or diarrhea.  She has oxygen sats in the 80s with mobility.  Objective: Vitals:   11/24/21 1132 11/24/21 1337 11/24/21 1416 11/24/21 1717  BP:  124/65  (!) 149/71  Pulse:  88  93  Resp: 16 17 16 16   Temp:  97.6 F (36.4 C)  98.9 F (37.2 C)  TempSrc:  Oral  Oral  SpO2: 93% 100% 100% 100%   Weight change:   Intake/Output Summary (Last 24 hours) at 11/24/2021 1905 Last data filed at 11/24/2021 1300 Gross per 24 hour  Intake 720 ml  Output --  Net 720 ml     General: Alert, awake, oriented x3, in no acute distress.  HEENT: [place]/AT PEERL, EOMI Neck: Trachea midline,  no masses, no thyromegal,y no JVD, no carotid bruit OROPHARYNX:  Moist, No exudate/ erythema/lesions.  Heart: Regular rate and rhythm, without murmurs, rubs, gallops, PMI non-displaced, no heaves or thrills on palpation.  Lungs: Clear to auscultation, no wheezing or rhonchi noted. No increased vocal fremitus resonant to percussion  Abdomen: Soft, nontender, nondistended, positive bowel sounds, no masses no hepatosplenomegaly noted..  Neuro: No focal neurological deficits noted cranial nerves II through XII grossly intact. DTRs 2+ bilaterally upper and lower extremities. Strength 5 out of 5 in bilateral upper and lower extremities. Musculoskeletal: No warm swelling or erythema around joints, no spinal tenderness noted. Psychiatric: Patient alert and oriented x3, good insight and cognition, good recent to remote recall. Lymph node survey: No cervical axillary or inguinal lymphadenopathy noted.   Data Reviewed: Basic Metabolic Panel: Recent Labs  Lab 11/21/21 1406  NA 141  K 4.0  CL 111  CO2 22  GLUCOSE 108*  BUN 9  CREATININE 0.67  CALCIUM 9.4    Liver Function Tests: No results for input(s): "AST", "ALT", "ALKPHOS", "BILITOT", "PROT", "ALBUMIN" in the last 168 hours. No results for input(s): "LIPASE", "AMYLASE" in the last 168 hours. No results for input(s):  "AMMONIA" in the last 168 hours. CBC: Recent Labs  Lab 11/21/21 1406  WBC 11.2*  HGB 12.7  HCT 36.9  MCV 72.1*  PLT 307    Cardiac Enzymes: No results for input(s): "CKTOTAL", "CKMB", "CKMBINDEX", "TROPONINI" in the last 168 hours. BNP (last 3 results) Recent Labs    11/21/21 1403  BNP 17.5     ProBNP (last 3 results) No results for input(s): "PROBNP" in the last 8760 hours.  CBG: No results for input(s): "GLUCAP" in the last 168 hours.  Recent Results (from the past 240 hour(s))  SARS Coronavirus 2 by RT PCR (hospital order, performed in [facility] hospital lab) *cepheid single result test* Anterior Nasal Swab     Status: None   Collection Time: 11/21/21  2:43 PM   Specimen: Anterior Nasal Swab  Result Value Ref Range Status   SARS Coronavirus 2 by RT PCR NEGATIVE NEGATIVE Final    Comment: (NOTE) SARS-CoV-2 target nucleic acids are NOT DETECTED.  The SARS-CoV-2 RNA is generally detectable in upper and lower respiratory specimens during the acute phase of infection. The lowest concentration of SARS-CoV-2 viral copies this assay can detect is 250 copies / mL. A negative result does not preclude SARS-CoV-2 infection and should not be used as the sole basis for treatment or other patient management decisions.  A negative result may occur with improper specimen collection / handling, submission of specimen other than nasopharyngeal swab, presence of viral mutation(s) within the areas targeted by this assay, and inadequate number of viral copies (<250 copies / mL). A negative result must be combined with clinical observations, patient history, and epidemiological information.  Fact Sheet for Patients:   https://www.patel.info/  Fact Sheet for Healthcare Providers: https://hall.com/  This test is not yet approved or  cleared by the [place] FDA and has been authorized for detection and/or diagnosis of SARS-CoV-2  by FDA under an Emergency Use Authorization (EUA).  This EUA will remain in effect (meaning this test can be used) for the duration of the COVID-19 declaration under Section 564(b)(1) of the Act, 21 U.S.C. section 360bbb-3(b)(1), unless the authorization is terminated or revoked sooner.  Performed at [facility], [place] [address] [place], [place] [address]      Studies: ECHOCARDIOGRAM COMPLETE  Result Date: 11/22/2021    ECHOCARDIOGRAM REPORT   Patient Name:   [facility] Date of Exam: 11/22/2021 Medical Rec #:  [id number]     Height:       65.0 in Accession #:    [place]:[id number]    Weight:       320.0 lb Date of Birth:  [birth year]    BSA:          2.415 m Patient Age:    79 years      BP:           134/62 mmHg Patient Gender: F             HR:           82 bpm. Exam Location:  Inpatient Procedure: 2D Echo, Color Doppler and Cardiac Doppler Indications:    Pulmonary Hypertension  History:        Patient has prior history of Echocardiogram examinations, most                 recent 11/15/2019. Arrythmias:Tachycardia.  Sonographer:    Memory Argue Referring Phys: [id number] [place]  1. Left ventricular ejection fraction, by estimation, is 60 to 65%. The left ventricle has normal function. The left ventricle has no regional wall motion abnormalities. Left ventricular diastolic parameters are indeterminate.  2. Right ventricular systolic function is normal. The right ventricular size is normal.  3. The mitral valve is normal in structure. Trivial mitral valve regurgitation.  4. The aortic valve is tricuspid. Aortic valve regurgitation is not visualized. Aortic valve sclerosis/calcification is present, without any evidence of aortic stenosis. FINDINGS  Left Ventricle: Left ventricular ejection fraction, by estimation, is 60 to 65%. The left ventricle has normal function. The left ventricle has no regional wall motion abnormalities. The left ventricular internal cavity  size was normal in size. There is  no left ventricular hypertrophy. Left ventricular diastolic parameters are indeterminate. Right Ventricle: The right ventricular size is normal. No increase in right ventricular wall thickness. Right ventricular systolic function is normal. Left Atrium: Left atrial size was normal in size. Right Atrium: Right atrial size was normal in size. Pericardium: There is no evidence of pericardial effusion. Mitral Valve: The mitral valve is normal in structure. Trivial mitral valve regurgitation. Tricuspid Valve: The tricuspid valve is normal in structure. Tricuspid valve regurgitation is trivial. Aortic Valve: The aortic valve is tricuspid. Aortic valve regurgitation is not visualized. Aortic valve sclerosis/calcification is present, without any evidence of aortic stenosis. Aortic valve mean gradient measures 5.0 mmHg. Aortic valve peak gradient measures 9.6 mmHg. Aortic valve area, by VTI measures 4.32 cm. Pulmonic Valve: The pulmonic valve was not well visualized. Pulmonic valve regurgitation is not visualized. No evidence of pulmonic stenosis. Aorta: The aortic root is normal in size and structure. IAS/Shunts: No atrial level shunt detected by color flow Doppler.  LEFT VENTRICLE PLAX 2D LVIDd:         4.80 cm   Diastology LVIDs:         3.10 cm   LV e' medial:    6.09 cm/s LV PW:         1.10 cm   LV E/e' medial:  17.4 LV IVS:        1.00 cm   LV e' lateral:   5.66 cm/s LVOT diam:     2.80 cm   LV E/e' lateral: 18.7 LV SV:         106 LV SV Index:   44 LVOT Area:     6.16 cm  RIGHT VENTRICLE TAPSE (M-mode): 2.3 cm LEFT ATRIUM             Index        RIGHT ATRIUM           Index LA diam:        4.00 cm 1.66 cm/m   RA Area:     12.00 cm LA Vol (A2C):   76.7 ml 31.75 ml/m  RA Volume:   24.30 ml  10.06 ml/m LA Vol (A4C):   57.4 ml 23.76 ml/m LA Biplane Vol: 67.7 ml 28.03 ml/m  AORTIC VALVE AV Area (Vmax):    3.96 cm AV Area (Vmean):   3.99 cm AV Area (VTI):     4.32 cm AV Vmax:  155.00 cm/s AV Vmean:          97.700 cm/s AV VTI:            0.245 m AV Peak Grad:      9.6 mmHg AV Mean Grad:      5.0 mmHg LVOT Vmax:         99.70 cm/s LVOT Vmean:        63.300 cm/s LVOT VTI:          0.172 m LVOT/AV VTI ratio: 0.70  AORTA Ao Root diam: 3.50 cm Ao Asc diam:  3.00 cm MITRAL VALVE MV Area (PHT): 4.06 cm     SHUNTS MV Decel Time: 187 msec     Systemic VTI:  0.17 m MV E velocity: 106.00 cm/s  Systemic Diam: 2.80 cm MV A velocity: 108.00 cm/s MV E/A ratio:  0.98 Dorris Carnes MD Electronically signed by Dorris Carnes MD Signature Date/Time: 11/22/2021/4:03:46 PM    Final    DG Chest Portable 1 View  Result Date: 11/21/2021 CLINICAL DATA:  Chest pain EXAM: PORTABLE CHEST 1 VIEW COMPARISON:  04/02/21 FINDINGS: No pleural effusion. No pneumothorax. Cardiomegaly. No focal airspace opacity. Prominent interstitial opacities, could be seen in the setting of pulmonary venous congestion without overt pulmonary edema. No displaced rib fractures. Visualized upper abdomen is unremarkable. IMPRESSION: 1. No radiographic finding to explain chest pain. 2. Cardiomegaly with pulmonary venous congestion. No overt pulmonary edema. No focal airspace opacity. Electronically Signed   By: Marin Roberts M.D.   On: 11/21/2021 16:13   CT Angio Chest PE W and/or Wo Contrast  Result Date: 11/21/2021 CLINICAL DATA:  Chest pain, pulmonary embolism suspected. EXAM: CT ANGIOGRAPHY CHEST WITH CONTRAST TECHNIQUE: Multidetector CT imaging of the chest was performed using the standard protocol during bolus administration of intravenous contrast. Multiplanar CT image reconstructions and MIPs were obtained to evaluate the vascular anatomy. RADIATION DOSE REDUCTION: This exam was performed according to the departmental dose-optimization program which includes automated exposure control, adjustment of the mA and/or kV according to patient size and/or use of iterative reconstruction technique. CONTRAST:  171mL OMNIPAQUE  IOHEXOL 350 MG/ML SOLN COMPARISON:  CT examination dated April 02, 2021 FINDINGS: Cardiovascular: Satisfactory opacification of the pulmonary arteries to the segmental level. No evidence of pulmonary embolism. Evaluation of distal segmental and subsegmental pulmonary arteries is limited due to phase of contrast enhancement and respiratory motion. Heart is mildly enlarged. Main pulmonary trunk is dilated measuring up to 3.2 cm concerning for pulmonary arterial hypertension. No pericardial effusion. Mediastinum/Nodes: No enlarged mediastinal, hilar, or axillary lymph nodes. Thyroid gland, trachea, and esophagus demonstrate no significant findings. Lungs/Pleura: No focal consolidation or pleural effusion. Low lung volumes. Bibasilar dependent atelectasis. 6 mm nodule in the superior aspect of the right middle lobe (series 6, image 58). Upper Abdomen: No acute abnormality. Musculoskeletal: No chest wall abnormality. No acute or significant osseous findings. Review of the MIP images confirms the above findings. IMPRESSION: 1. No evidence of central pulmonary embolism. Evaluation of distal segmental and subsegmental pulmonary arteries is limited due to phase of contrast enhancement and respiratory motion. 2. Mild cardiomegaly. 3. Main pulmonary trunk is dilated measuring up to 3.2 cm concerning for pulmonary arterial hypertension. 4. Low lung volumes with bibasilar dependent atelectasis. No focal consolidation or pleural effusion. 5. 6 mm nodule in the superior aspect of the right middle lobe, unchanged. Electronically Signed   By: Keane Police D.O.   On: 11/21/2021 15:44    Scheduled Meds:  enoxaparin (LOVENOX) injection  40 mg Subcutaneous [id number]   folic acid  1 mg Oral Daily   HYDROmorphone   Intravenous Q4H   ketorolac  15 mg Intravenous Q6H   pantoprazole  40 mg Oral Daily   Or   pantoprazole (PROTONIX) IV  40 mg Intravenous Daily   senna-docusate  1 tablet Oral BID   voxelotor  1,500 mg Oral Q1500    Continuous Infusions:  sodium chloride 10 mL/hr at 11/22/21 1238    Active Problems:   Morbid obesity with body mass index (BMI) of 45.0 to 49.9 in adult [facility])   Hypoxia   Sickle cell pain crisis ([place])   Pulmonary hypertension ([place])

## 2021-11-24 NOTE — Hospital Course (Signed)
Stephanie Burch  is a 41 y.o. female with a medical history significant for sickle cell disease, chronic pain syndrome, opiate dependence, and tolerance, and morbid obesity presents to the emergency department with complaints of lower extremity pain. Patient  says that lower extremity pain has been increasing over the past 24 hours and has been unrelieved by home medications. Patient's home medications include Xtampza and dilaudid. Opiate medications are managed by patient's hematology team at [facility]. She rates lower extremity pain at 10/10, constant and sharp. She denies fever, chills, chest pain, or shortness of breath. No urinary symptoms, nausea, vomiting, or diarrhea. No sick contacts, recent travel, or exposure to COVID 19.  Patient was admitted and treated for sickle cell crisis.  Pain management mildly.  Continue with Xtampza substituted.  Also Dilaudid as needed.  NSAIDs also added.  Patient continued to have shortness of breath but got better.  At this point she is being stable and discharged home on home regimen.

## 2021-11-25 ENCOUNTER — Other Ambulatory Visit: Payer: Self-pay

## 2021-11-25 LAB — BASIC METABOLIC PANEL
Anion gap: 6 (ref 5–15)
BUN: 11 mg/dL (ref 6–20)
CO2: 25 mmol/L (ref 22–32)
Calcium: 9.1 mg/dL (ref 8.9–10.3)
Chloride: 110 mmol/L (ref 98–111)
Creatinine, Ser: 0.59 mg/dL (ref 0.44–1.00)
GFR, Estimated: >60 mL/min (ref >60)
Glucose, Bld: 94 mg/dL (ref 70–99)
Potassium: 4.1 mmol/L (ref 3.5–5.1)
Sodium: 141 mmol/L (ref 135–145)

## 2021-11-25 LAB — CBC
HCT: 31.7 % — ABNORMAL LOW (ref 36.0–46.0)
Hemoglobin: 10.7 g/dL — ABNORMAL LOW (ref 12.0–15.0)
MCH: 24.6 pg — ABNORMAL LOW (ref 26.0–34.0)
MCHC: 33.8 g/dL (ref 30.0–36.0)
MCV: 72.9 fL — ABNORMAL LOW (ref 80.0–100.0)
Platelets: 346 10*3/uL (ref 150–400)
RBC: 4.35 MIL/uL (ref 3.87–5.11)
RDW: 16.5 % — ABNORMAL HIGH (ref 11.5–15.5)
WBC: 10.9 10*3/uL — ABNORMAL HIGH (ref 4.0–10.5)
nRBC: 4.8 % — ABNORMAL HIGH (ref 0.0–0.2)

## 2021-11-25 MED ORDER — HYDROMORPHONE 1 MG/ML IV SOLN
INTRAVENOUS | Status: DC
  Administered 2021-11-25: 6 mg via INTRAVENOUS

## 2021-11-25 NOTE — Plan of Care (Signed)
  Problem: Education: Goal: Knowledge of vaso-occlusive preventative measures will improve Outcome: Adequate for Discharge Goal: Awareness of infection prevention will improve Outcome: Adequate for Discharge Goal: Awareness of signs and symptoms of anemia will improve Outcome: Adequate for Discharge Goal: Long-term complications will improve Outcome: Adequate for Discharge   Problem: Self-Care: Goal: Ability to incorporate actions that prevent/reduce pain crisis will improve Outcome: Adequate for Discharge   Problem: Bowel/Gastric: Goal: Gut motility will be maintained Outcome: Adequate for Discharge   Problem: Tissue Perfusion: Goal: Complications related to inadequate tissue perfusion will be avoided or minimized Outcome: Adequate for Discharge   Problem: Respiratory: Goal: Pulmonary complications will be avoided or minimized Outcome: Adequate for Discharge Goal: Acute Chest Syndrome will be identified early to prevent complications Outcome: Adequate for Discharge   Problem: Fluid Volume: Goal: Ability to maintain a balanced intake and output will improve Outcome: Adequate for Discharge   Problem: Sensory: Goal: Pain level will decrease with appropriate interventions Outcome: Adequate for Discharge   Problem: Health Behavior: Goal: Postive changes in compliance with treatment and prescription regimens will improve Outcome: Adequate for Discharge   Problem: Education: Goal: Knowledge of General Education information will improve Description: Including pain rating scale, medication(s)/side effects and non-pharmacologic comfort measures Outcome: Adequate for Discharge   Problem: Health Behavior/Discharge Planning: Goal: Ability to manage health-related needs will improve Outcome: Adequate for Discharge   Problem: Clinical Measurements: Goal: Ability to maintain clinical measurements within normal limits will improve Outcome: Adequate for Discharge Goal: Will remain  free from infection Outcome: Adequate for Discharge Goal: Diagnostic test results will improve Outcome: Adequate for Discharge Goal: Respiratory complications will improve Outcome: Adequate for Discharge Goal: Cardiovascular complication will be avoided Outcome: Adequate for Discharge   Problem: Clinical Measurements: Goal: Ability to maintain clinical measurements within normal limits will improve Outcome: Adequate for Discharge Goal: Will remain free from infection Outcome: Adequate for Discharge Goal: Diagnostic test results will improve Outcome: Adequate for Discharge Goal: Respiratory complications will improve Outcome: Adequate for Discharge Goal: Cardiovascular complication will be avoided Outcome: Adequate for Discharge   Problem: Activity: Goal: Risk for activity intolerance will decrease Outcome: Adequate for Discharge   Problem: Nutrition: Goal: Adequate nutrition will be maintained Outcome: Adequate for Discharge   Problem: Coping: Goal: Level of anxiety will decrease Outcome: Adequate for Discharge   Problem: Elimination: Goal: Will not experience complications related to bowel motility Outcome: Adequate for Discharge Goal: Will not experience complications related to urinary retention Outcome: Adequate for Discharge   Problem: Pain Managment: Goal: General experience of comfort will improve Outcome: Adequate for Discharge   Problem: Safety: Goal: Ability to remain free from injury will improve Outcome: Adequate for Discharge   Problem: Skin Integrity: Goal: Risk for impaired skin integrity will decrease Outcome: Adequate for Discharge

## 2021-11-25 NOTE — Progress Notes (Signed)
Subjective: Stephanie Burch is a 41 year old female with a medical history significant for sickle cell disease, chronic pain syndrome, opiate dependence and tolerance, anemia of chronic disease, and morbid obesity was admitted for sickle cell pain crisis. Patient is complaining of increased pain primarily to low back and lower extremities.  Pain intensity is 9/10 today.  Patient denies headache, chest pain, urinary symptoms, nausea, vomiting, or diarrhea.  She endorses dry cough.  Objective:  Vital signs in last 24 hours:  Vitals:   11/25/21 0822 11/25/21 1053 11/25/21 1133 11/25/21 1407  BP:  129/68  (!) 154/77  Pulse:  86  99  Resp: 16 14 16 14   Temp:  98 F (36.7 C)  98.5 F (36.9 C)  TempSrc:  Oral  Oral  SpO2: 100% 92% 95% 97%  Weight:      Height:        Intake/Output from previous day:   Intake/Output Summary (Last 24 hours) at 11/25/2021 1447 Last data filed at 11/25/2021 0559 Gross per 24 hour  Intake 606 ml  Output --  Net 606 ml     Physical Exam: General: Alert, awake, oriented x3, in no acute distress.  HEENT: [place]/AT PEERL, EOMI Neck: Trachea midline,  no masses, no thyromegal,y no JVD, no carotid bruit OROPHARYNX:  Moist, No exudate/ erythema/lesions.  Heart: Regular rate and rhythm, without murmurs, rubs, gallops, PMI non-displaced, no heaves or thrills on palpation.  Lungs: Clear to auscultation, no wheezing or rhonchi noted. No increased vocal fremitus resonant to percussion  Abdomen: Soft, nontender, nondistended, positive bowel sounds, no masses no hepatosplenomegaly noted..  Neuro: No focal neurological deficits noted cranial nerves II through XII grossly intact. DTRs 2+ bilaterally upper and lower extremities. Strength 5 out of 5 in bilateral upper and lower extremities. Musculoskeletal: No warm swelling or erythema around joints, no spinal tenderness noted. Psychiatric: Patient alert and oriented x3, good insight and cognition, good recent to remote  recall. Lymph node survey: No cervical axillary or inguinal lymphadenopathy noted.  Lab Results:  Basic Metabolic Panel:    Component Value Date/Time   NA 141 11/25/2021 0908   NA 142 09/11/2017 1431   K 4.1 11/25/2021 0908   CL 110 11/25/2021 0908   CO2 25 11/25/2021 0908   BUN 11 11/25/2021 0908   BUN 8 09/11/2017 1431   CREATININE 0.59 11/25/2021 0908   GLUCOSE 94 11/25/2021 0908   CALCIUM 9.1 11/25/2021 0908   CBC:    Component Value Date/Time   WBC 10.9 (H) 11/25/2021 0908   HGB 10.7 (L) 11/25/2021 0908   HGB 13.0 09/11/2017 1431   HCT 31.7 (L) 11/25/2021 0908   HCT 38.4 09/11/2017 1431   PLT 346 11/25/2021 0908   PLT 258 09/11/2017 1431   MCV 72.9 (L) 11/25/2021 0908   MCV 75 (L) 09/11/2017 1431   NEUTROABS 4.8 11/07/2021 0943   NEUTROABS 3.9 09/11/2017 1431   LYMPHSABS 2.3 11/07/2021 0943   LYMPHSABS 2.2 09/11/2017 1431   MONOABS 0.7 11/07/2021 0943   EOSABS 0.3 11/07/2021 0943   EOSABS 0.1 09/11/2017 1431   BASOSABS 0.0 11/07/2021 0943   BASOSABS 0.0 09/11/2017 1431    Recent Results (from the past 240 hour(s))  SARS Coronavirus 2 by RT PCR (hospital order, performed in [facility] hospital lab) *cepheid single result test* Anterior Nasal Swab     Status: None   Collection Time: 11/21/21  2:43 PM   Specimen: Anterior Nasal Swab  Result Value Ref Range Status  SARS Coronavirus 2 by RT PCR NEGATIVE NEGATIVE Final    Comment: (NOTE) SARS-CoV-2 target nucleic acids are NOT DETECTED.  The SARS-CoV-2 RNA is generally detectable in upper and lower respiratory specimens during the acute phase of infection. The lowest concentration of SARS-CoV-2 viral copies this assay can detect is 250 copies / mL. A negative result does not preclude SARS-CoV-2 infection and should not be used as the sole basis for treatment or other patient management decisions.  A negative result may occur with improper specimen collection / handling, submission of specimen other than  nasopharyngeal swab, presence of viral mutation(s) within the areas targeted by this assay, and inadequate number of viral copies (<250 copies / mL). A negative result must be combined with clinical observations, patient history, and epidemiological information.  Fact Sheet for Patients:   RoadLapTop.co.za  Fact Sheet for Healthcare Providers: http://kim-miller.com/  This test is not yet approved or  cleared by the [place] FDA and has been authorized for detection and/or diagnosis of SARS-CoV-2 by FDA under an Emergency Use Authorization (EUA).  This EUA will remain in effect (meaning this test can be used) for the duration of the COVID-19 declaration under Section 564(b)(1) of the Act, 21 U.S.C. section 360bbb-3(b)(1), unless the authorization is terminated or revoked sooner.  Performed at [facility], [address], [place], [place] [address]     Studies/Results: No results found.  Medications: Scheduled Meds:  enoxaparin (LOVENOX) injection  40 mg Subcutaneous Q24H   folic acid  1 mg Oral Daily   ketorolac  15 mg Intravenous Q6H   pantoprazole  40 mg Oral Daily   senna-docusate  1 tablet Oral BID   voxelotor  1,500 mg Oral Q1500   Continuous Infusions:   PRN Meds:.albuterol, diphenhydrAMINE, menthol-cetylpyridinium, ondansetron, oxycodone, polyethylene glycol  Consultants: none  Procedures: none  Antibiotics: none  Assessment/Plan: Principal Problem:   Sickle cell pain crisis (HCC) Active Problems:   Morbid obesity with body mass index (BMI) of 45.0 to 49.9 in adult [facility])   Hypoxia   Pulmonary hypertension (HCC)  Sickle cell disease with pain crisis: Continue IV Dilaudid PCA without any changes Toradol 15 mg IV every 6 hours Decrease IV fluids to [facility] Monitor vital signs very closely, reevaluate pain scale regularly, and supplemental oxygen as needed  Chronic pain syndrome: Hold  oral Dilaudid and oxycodone, will restart as pain intensity improves.  Anemia of chronic disease: Hemoglobin stable and consistent with patient's baseline.  No clinical indication for blood transfusion at this time.  Morbid obesity: Dietary counseling on admission.  Continue heart healthy diet.  Code Status: Full Code Family Communication: N/A Disposition Plan: Not yet ready for discharge  Kline Bulthuis Rennis Petty  APRN, MSN, FNP-C [facility] [address] [place], [place] [address] [phone]  If 7PM-7AM, please contact night-coverage.  11/25/2021, 2:47 PM  LOS: 3 days

## 2021-11-25 NOTE — Progress Notes (Signed)
Mobility Specialist - Progress Note   11/25/21 1107  Mobility  Activity Ambulated with assistance in hallway  Level of Assistance Independent after set-up  Assistive Device None  Distance Ambulated (ft) 200 ft  Activity Response Tolerated well  Mobility Referral Yes  $Mobility charge 1 Mobility   Pt received in bed and agreed to mobility, some pain in foot. Pt SpO2 on RA was 94%, did not get lower. Pt back to bed with all needs met.   Roderick Pee Mobility Specialist

## 2021-11-25 NOTE — Discharge Summary (Addendum)
Physician Discharge Summary  Stephanie Burch [id number] DOB: [birth year] DOA: 11/21/2021  PCP: Pcp, No  Admit date: 11/21/2021  Discharge date: 11/25/2021  Discharge Diagnoses:  Active Problems:   Morbid obesity with body mass index (BMI) of 45.0 to 49.9 in adult [facility])   Hypoxia   Sickle cell pain crisis ([place])   Pulmonary hypertension ([place])   Discharge Condition: Stable  Disposition:  Pt is discharged home in good condition and is to follow up with [facility] hematology this week to have labs evaluated. Stephanie Burch is instructed to increase activity slowly and balance with rest for the next few days, and use prescribed medication to complete treatment of pain  Diet: Regular Wt Readings from Last 3 Encounters:  11/25/21 (!) 151.4 kg  11/07/21 (!) 145.2 kg  10/18/21 (!) 147.4 kg    History of present illness:  Stephanie Burch is a 41 year old female with a medical history significant for sickle cell disease, chronic pain syndrome, morbid obesity, opiate dependence and tolerance presented with sickle cell pain.  Patient reports 2 days of bilateral lower extremity and left arm pain similar to previous sickle cell pain.  She also reports a sore throat, weakness, shortness of breath, chest tightness, but no real chest pain.  Also, reports ongoing chronic intermittent stomach cramping.  States that she ran out of her home Dilaudid yesterday and her home oxycodone has not been controlling her pain. She denies fevers, chills, abdominal pain, constipation, diarrhea, nausea, or vomiting.  ED course: Vital signs in ED significant for heart rate initially in the 100s, but improved to the 80s after initial interventions.  Blood pressure in the [id number] to [id number] systolically.  Respiratory rate in the teens to 20s.  Lab work included BMP with glucose of 108.  CBC showed leukocytosis to 11.2 and hemoglobin stable at 12.7.  COVID screening negative.  Urinalysis with protein and rare bacteria only.   Reticulocyte count elevated to 4.6.  Imaging studies show chest x-ray with no acute abnormalities, but did show cardiomegaly and pulmonary venous congestion without evidence of pulmonary edema.  CTA PE was negative for PE, but limited distal evaluation, did show mild nodule in the right middle lobe.  Patient received Dilaudid, Toradol, Zofran, half-normal saline, Benadryl in the ED.  Has continued pain despite this.  Also, was noted to desaturate on ambulation with quick recovery at rest.  Hospital Course:  Sickle cell pan crisis:  Patient was admitted for sickle cell pain crisis and managed appropriately with IVF, IV Dilaudid via PCA and IV Toradol, as well as other adjunct therapies per sickle cell pain management protocols. IV Dilaudid PCA was weaned appropriately and patient transition to her home medications.  Pain intensity is 6/10 and patient will be discharged today. Upon discharge, patient will resume home medication regimen.  Her medications are managed by [facility] hematology.  Recommend that patient follows up as scheduled. Patient is alert, oriented, and ambulating.  Oxygen saturation above 90% with ambulation.  Patient was therefore discharged home today in a hemodynamically stable condition.   Stephanie Burch will follow-up with PCP within 1 week of this discharge. Stephanie Burch was counseled extensively about nonpharmacologic means of pain management, patient verbalized understanding and was appreciative of  the care received during this admission.   We discussed the need for good hydration, monitoring of hydration status, avoidance of heat, cold, stress, and infection triggers. We discussed the need to be adherent with taking Hydrea and other home medications. Patient was reminded  of the need to seek medical attention immediately if any symptom of bleeding, anemia, or infection occurs.  Discharge Exam: Vitals:   11/25/21 1133 11/25/21 1407  BP:  (!) 154/77  Pulse:  99  Resp: 16 14   Temp:  98.5 F (36.9 C)  SpO2: 95% 97%   Vitals:   11/25/21 0822 11/25/21 1053 11/25/21 1133 11/25/21 1407  BP:  129/68  (!) 154/77  Pulse:  86  99  Resp: 16 14 16 14   Temp:  98 F (36.7 C)  98.5 F (36.9 C)  TempSrc:  Oral  Oral  SpO2: 100% 92% 95% 97%  Weight:      Height:        General appearance : Awake, alert, not in any distress. Speech Clear. Not toxic looking HEENT: Atraumatic and Normocephalic, pupils equally reactive to light and accomodation Neck: Supple, no JVD. No cervical lymphadenopathy.  Chest: Good air entry bilaterally, no added sounds  CVS: S1 S2 regular, no murmurs.  Abdomen: Bowel sounds present, Non tender and not distended with no gaurding, rigidity or rebound. Extremities: B/L Lower Ext shows no edema, both legs are warm to touch Neurology: Awake alert, and oriented X 3, CN II-XII intact, Non focal Skin: No Rash  Discharge Instructions  Discharge Instructions     Discharge patient   Complete by: As directed    Discharge disposition: 01-Home or Self Care   Discharge patient date: 11/25/2021      Allergies as of 11/25/2021   No Known Allergies      Medication List     TAKE these medications    acetaminophen 500 MG tablet Commonly known as: TYLENOL Take 1,000 mg by mouth every 6 (six) hours as needed for mild pain, moderate pain or headache.   folic acid 1 MG tablet Commonly known as: FOLVITE Take 1 mg by mouth daily.   HYDROmorphone 4 MG tablet Commonly known as: DILAUDID Take 4 mg by mouth every 6 (six) hours as needed for severe pain.   ibuprofen 200 MG tablet Commonly known as: ADVIL Take 800 mg by mouth 3 (three) times daily as needed for fever, headache or mild pain.   Oxbryta 500 MG Tabs tablet Generic drug: voxelotor Take 1,500 mg by mouth daily in the afternoon.   oxycodone 30 MG immediate release tablet Commonly known as: ROXICODONE Take 30 mg by mouth every 4 (four) hours as needed for pain.        The  results of significant diagnostics from this hospitalization (including imaging, microbiology, ancillary and laboratory) are listed below for reference.    Significant Diagnostic Studies: ECHOCARDIOGRAM COMPLETE  Result Date: 11/22/2021    ECHOCARDIOGRAM REPORT   Patient Name:   [facility] Staff Date of Exam: 11/22/2021 Medical Rec #:  [id number]     Height:       65.0 in Accession #:    [id number]    Weight:       320.0 lb Date of Birth:  05-31-1980    BSA:          2.415 m Patient Age:    55 years      BP:           134/62 mmHg Patient Gender: F             HR:           82 bpm. Exam Location:  Inpatient Procedure: 2D Echo, Color Doppler and Cardiac Doppler Indications:    Pulmonary Hypertension  History:        Patient has prior history of Echocardiogram examinations, most                 recent 11/15/2019. Arrythmias:Tachycardia.  Sonographer:    Memory Argue Referring Phys: [id number] [place]  1. Left ventricular ejection fraction, by estimation, is 60 to 65%. The left ventricle has normal function. The left ventricle has no regional wall motion abnormalities. Left ventricular diastolic parameters are indeterminate.  2. Right ventricular systolic function is normal. The right ventricular size is normal.  3. The mitral valve is normal in structure. Trivial mitral valve regurgitation.  4. The aortic valve is tricuspid. Aortic valve regurgitation is not visualized. Aortic valve sclerosis/calcification is present, without any evidence of aortic stenosis. FINDINGS  Left Ventricle: Left ventricular ejection fraction, by estimation, is 60 to 65%. The left ventricle has normal function. The left ventricle has no regional wall motion abnormalities. The left ventricular internal cavity size was normal in size. There is  no left ventricular hypertrophy. Left ventricular diastolic parameters are indeterminate. Right Ventricle: The right ventricular size is normal. No increase in right ventricular  wall thickness. Right ventricular systolic function is normal. Left Atrium: Left atrial size was normal in size. Right Atrium: Right atrial size was normal in size. Pericardium: There is no evidence of pericardial effusion. Mitral Valve: The mitral valve is normal in structure. Trivial mitral valve regurgitation. Tricuspid Valve: The tricuspid valve is normal in structure. Tricuspid valve regurgitation is trivial. Aortic Valve: The aortic valve is tricuspid. Aortic valve regurgitation is not visualized. Aortic valve sclerosis/calcification is present, without any evidence of aortic stenosis. Aortic valve mean gradient measures 5.0 mmHg. Aortic valve peak gradient measures 9.6 mmHg. Aortic valve area, by VTI measures 4.32 cm. Pulmonic Valve: The pulmonic valve was not well visualized. Pulmonic valve regurgitation is not visualized. No evidence of pulmonic stenosis. Aorta: The aortic root is normal in size and structure. IAS/Shunts: No atrial level shunt detected by color flow Doppler.  LEFT VENTRICLE PLAX 2D LVIDd:         4.80 cm   Diastology LVIDs:         3.10 cm   LV e' medial:    6.09 cm/s LV PW:         1.10 cm   LV E/e' medial:  17.4 LV IVS:        1.00 cm   LV e' lateral:   5.66 cm/s LVOT diam:     2.80 cm   LV E/e' lateral: 18.7 LV SV:         106 LV SV Index:   44 LVOT Area:     6.16 cm  RIGHT VENTRICLE TAPSE (M-mode): 2.3 cm LEFT ATRIUM             Index        RIGHT ATRIUM           Index LA diam:        4.00 cm 1.66 cm/m   RA Area:     12.00 cm LA Vol (A2C):   76.7 ml 31.75 ml/m  RA Volume:   24.30 ml  10.06 ml/m LA Vol (A4C):   57.4 ml 23.76 ml/m LA Biplane Vol: 67.7 ml 28.03 ml/m  AORTIC VALVE AV Area (Vmax):    3.96 cm AV Area (Vmean):   3.99 cm AV Area (VTI):     4.32 cm AV Vmax:  155.00 cm/s AV Vmean:          97.700 cm/s AV VTI:            0.245 m AV Peak Grad:      9.6 mmHg AV Mean Grad:      5.0 mmHg LVOT Vmax:         99.70 cm/s LVOT Vmean:        63.300 cm/s LVOT VTI:           0.172 m LVOT/AV VTI ratio: 0.70  AORTA Ao Root diam: 3.50 cm Ao Asc diam:  3.00 cm MITRAL VALVE MV Area (PHT): 4.06 cm     SHUNTS MV Decel Time: 187 msec     Systemic VTI:  0.17 m MV E velocity: 106.00 cm/s  Systemic Diam: 2.80 cm MV A velocity: 108.00 cm/s MV E/A ratio:  0.98 Dorris Carnes MD Electronically signed by Dorris Carnes MD Signature Date/Time: 11/22/2021/4:03:46 PM    Final    DG Chest Portable 1 View  Result Date: 11/21/2021 CLINICAL DATA:  Chest pain EXAM: PORTABLE CHEST 1 VIEW COMPARISON:  04/02/21 FINDINGS: No pleural effusion. No pneumothorax. Cardiomegaly. No focal airspace opacity. Prominent interstitial opacities, could be seen in the setting of pulmonary venous congestion without overt pulmonary edema. No displaced rib fractures. Visualized upper abdomen is unremarkable. IMPRESSION: 1. No radiographic finding to explain chest pain. 2. Cardiomegaly with pulmonary venous congestion. No overt pulmonary edema. No focal airspace opacity. Electronically Signed   By: Marin Roberts M.D.   On: 11/21/2021 16:13   CT Angio Chest PE W and/or Wo Contrast  Result Date: 11/21/2021 CLINICAL DATA:  Chest pain, pulmonary embolism suspected. EXAM: CT ANGIOGRAPHY CHEST WITH CONTRAST TECHNIQUE: Multidetector CT imaging of the chest was performed using the standard protocol during bolus administration of intravenous contrast. Multiplanar CT image reconstructions and MIPs were obtained to evaluate the vascular anatomy. RADIATION DOSE REDUCTION: This exam was performed according to the departmental dose-optimization program which includes automated exposure control, adjustment of the mA and/or kV according to patient size and/or use of iterative reconstruction technique. CONTRAST:  132mL OMNIPAQUE IOHEXOL 350 MG/ML SOLN COMPARISON:  CT examination dated April 02, 2021 FINDINGS: Cardiovascular: Satisfactory opacification of the pulmonary arteries to the segmental level. No evidence of pulmonary embolism.  Evaluation of distal segmental and subsegmental pulmonary arteries is limited due to phase of contrast enhancement and respiratory motion. Heart is mildly enlarged. Main pulmonary trunk is dilated measuring up to 3.2 cm concerning for pulmonary arterial hypertension. No pericardial effusion. Mediastinum/Nodes: No enlarged mediastinal, hilar, or axillary lymph nodes. Thyroid gland, trachea, and esophagus demonstrate no significant findings. Lungs/Pleura: No focal consolidation or pleural effusion. Low lung volumes. Bibasilar dependent atelectasis. 6 mm nodule in the superior aspect of the right middle lobe (series 6, image 58). Upper Abdomen: No acute abnormality. Musculoskeletal: No chest wall abnormality. No acute or significant osseous findings. Review of the MIP images confirms the above findings. IMPRESSION: 1. No evidence of central pulmonary embolism. Evaluation of distal segmental and subsegmental pulmonary arteries is limited due to phase of contrast enhancement and respiratory motion. 2. Mild cardiomegaly. 3. Main pulmonary trunk is dilated measuring up to 3.2 cm concerning for pulmonary arterial hypertension. 4. Low lung volumes with bibasilar dependent atelectasis. No focal consolidation or pleural effusion. 5. 6 mm nodule in the superior aspect of the right middle lobe, unchanged. Electronically Signed   By: Keane Police D.O.   On: 11/21/2021 15:44    Microbiology: Recent  Results (from the past 240 hour(s))  SARS Coronavirus 2 by RT PCR (hospital order, performed in [facility] hospital lab) *cepheid single result test* Anterior Nasal Swab     Status: None   Collection Time: 11/21/21  2:43 PM   Specimen: Anterior Nasal Swab  Result Value Ref Range Status   SARS Coronavirus 2 by RT PCR NEGATIVE NEGATIVE Final    Comment: (NOTE) SARS-CoV-2 target nucleic acids are NOT DETECTED.  The SARS-CoV-2 RNA is generally detectable in upper and lower respiratory specimens during the acute phase of  infection. The lowest concentration of SARS-CoV-2 viral copies this assay can detect is 250 copies / mL. A negative result does not preclude SARS-CoV-2 infection and should not be used as the sole basis for treatment or other patient management decisions.  A negative result may occur with improper specimen collection / handling, submission of specimen other than nasopharyngeal swab, presence of viral mutation(s) within the areas targeted by this assay, and inadequate number of viral copies (<250 copies / mL). A negative result must be combined with clinical observations, patient history, and epidemiological information.  Fact Sheet for Patients:   https://www.patel.info/  Fact Sheet for Healthcare Providers: https://hall.com/  This test is not yet approved or  cleared by the [place] FDA and has been authorized for detection and/or diagnosis of SARS-CoV-2 by FDA under an Emergency Use Authorization (EUA).  This EUA will remain in effect (meaning this test can be used) for the duration of the COVID-19 declaration under Section 564(b)(1) of the Act, 21 U.S.C. section 360bbb-3(b)(1), unless the authorization is terminated or revoked sooner.  Performed at [facility], [place] [address] [place], [place] [address]      Labs: Basic Metabolic Panel: Recent Labs  Lab 11/21/21 1406 11/25/21 0908  NA 141 141  K 4.0 4.1  CL 111 110  CO2 22 25  GLUCOSE 108* 94  BUN 9 11  CREATININE 0.67 0.59  CALCIUM 9.4 9.1   Liver Function Tests: No results for input(s): "AST", "ALT", "ALKPHOS", "BILITOT", "PROT", "ALBUMIN" in the last 168 hours. No results for input(s): "LIPASE", "AMYLASE" in the last 168 hours. No results for input(s): "AMMONIA" in the last 168 hours. CBC: Recent Labs  Lab 11/21/21 1406 11/25/21 0908  WBC 11.2* 10.9*  HGB 12.7 10.7*  HCT 36.9 31.7*  MCV 72.1* 72.9*  PLT 307 346   Cardiac Enzymes: No  results for input(s): "CKTOTAL", "CKMB", "CKMBINDEX", "TROPONINI" in the last 168 hours. BNP: Invalid input(s): "POCBNP" CBG: No results for input(s): "GLUCAP" in the last 168 hours.  Time coordinating discharge: 50 minutes  Signed: [name]  APRN, MSN, FNP-C [facility] [address] [place], [place] [address] [phone]  [facility] 11/25/2021, 2:35 PM

## 2021-11-25 NOTE — Care Management Important Message (Signed)
Important Message  Patient Details IM Letter given Name: Stephanie Burch MRN: [id number] Date of Birth: 24-Jul-1980   Medicare Important Message Given:  Yes     Kerin Salen 11/25/2021, 12:31 PM

## 2021-11-25 NOTE — Progress Notes (Signed)
Patient being discharged home. PIV and tele removed per order. Discharge teaching completed and all questions answered. Patient sent home with all personal belongings and escorted to lobby via wheelchair.

## 2021-12-06 ENCOUNTER — Telehealth (HOSPITAL_COMMUNITY): Payer: Self-pay | Admitting: *Deleted

## 2021-12-06 ENCOUNTER — Non-Acute Institutional Stay (HOSPITAL_BASED_OUTPATIENT_CLINIC_OR_DEPARTMENT_OTHER)
Admission: AD | Admit: 2021-12-06 | Discharge: 2021-12-06 | Disposition: A | Discharge status: Home / Self Care | Payer: Medicare Other | Source: Home / Self Care | Attending: Internal Medicine | Admitting: Internal Medicine

## 2021-12-06 DIAGNOSIS — D5700 Hb-SS disease with crisis, unspecified: Secondary | ICD-10-CM | POA: Insufficient documentation

## 2021-12-06 DIAGNOSIS — G894 Chronic pain syndrome: Secondary | ICD-10-CM | POA: Insufficient documentation

## 2021-12-06 DIAGNOSIS — I517 Cardiomegaly: Secondary | ICD-10-CM | POA: Insufficient documentation

## 2021-12-06 DIAGNOSIS — D638 Anemia in other chronic diseases classified elsewhere: Secondary | ICD-10-CM | POA: Insufficient documentation

## 2021-12-06 DIAGNOSIS — F1120 Opioid dependence, uncomplicated: Secondary | ICD-10-CM | POA: Insufficient documentation

## 2021-12-06 LAB — CBC WITH DIFFERENTIAL/PLATELET
Abs Immature Granulocytes: 0.02 10*3/uL (ref 0.00–0.07)
Basophils Absolute: 0 10*3/uL (ref 0.0–0.1)
Basophils Relative: 1 %
Eosinophils Absolute: 0.2 10*3/uL (ref 0.0–0.5)
Eosinophils Relative: 3 %
HCT: 39 % (ref 36.0–46.0)
Hemoglobin: 13.3 g/dL (ref 12.0–15.0)
Immature Granulocytes: 0 %
Lymphocytes Relative: 24 %
Lymphs Abs: 2 10*3/uL (ref 0.7–4.0)
MCH: 24.5 pg — ABNORMAL LOW (ref 26.0–34.0)
MCHC: 34.1 g/dL (ref 30.0–36.0)
MCV: 71.8 fL — ABNORMAL LOW (ref 80.0–100.0)
Monocytes Absolute: 0.6 10*3/uL (ref 0.1–1.0)
Monocytes Relative: 7 %
Neutro Abs: 5.4 10*3/uL (ref 1.7–7.7)
Neutrophils Relative %: 65 %
Platelets: 246 10*3/uL (ref 150–400)
RBC: 5.43 MIL/uL — ABNORMAL HIGH (ref 3.87–5.11)
RDW: 15.2 % (ref 11.5–15.5)
WBC: 8.2 10*3/uL (ref 4.0–10.5)
nRBC: 0 % (ref 0.0–0.2)

## 2021-12-06 LAB — COMPREHENSIVE METABOLIC PANEL
ALT: 22 U/L (ref 0–44)
AST: 21 U/L (ref 15–41)
Albumin: 4 g/dL (ref 3.5–5.0)
Alkaline Phosphatase: 66 U/L (ref 38–126)
Anion gap: 9 (ref 5–15)
BUN: 10 mg/dL (ref 6–20)
CO2: 25 mmol/L (ref 22–32)
Calcium: 9.8 mg/dL (ref 8.9–10.3)
Chloride: 107 mmol/L (ref 98–111)
Creatinine, Ser: 0.48 mg/dL (ref 0.44–1.00)
GFR, Estimated: >60 mL/min (ref >60)
Glucose, Bld: 99 mg/dL (ref 70–99)
Potassium: 4.4 mmol/L (ref 3.5–5.1)
Sodium: 141 mmol/L (ref 135–145)
Total Bilirubin: 0.6 mg/dL (ref 0.3–1.2)
Total Protein: 7.8 g/dL (ref 6.5–8.1)

## 2021-12-06 LAB — RETICULOCYTES
Immature Retic Fract: 30.4 % — ABNORMAL HIGH (ref 2.3–15.9)
RBC.: 5.39 MIL/uL — ABNORMAL HIGH (ref 3.87–5.11)
Retic Count, Absolute: 195.1 10*3/uL — ABNORMAL HIGH (ref 19.0–186.0)
Retic Ct Pct: 3.6 % — ABNORMAL HIGH (ref 0.4–3.1)

## 2021-12-06 MED ORDER — ONDANSETRON HCL 4 MG/2ML IJ SOLN: 4.0000 mg | Freq: Four times a day (QID) | INTRAMUSCULAR | Status: DC | PRN

## 2021-12-06 MED ORDER — KETOROLAC TROMETHAMINE 30 MG/ML IJ SOLN
15.0000 mg | Freq: Once | INTRAMUSCULAR | Status: AC
  Administered 2021-12-06: 15 mg via INTRAVENOUS
  Filled 2021-12-06: qty 1

## 2021-12-06 MED ORDER — SODIUM CHLORIDE 0.9% FLUSH: 9.0000 mL | INTRAVENOUS | Status: DC | PRN

## 2021-12-06 MED ORDER — NALOXONE HCL 0.4 MG/ML IJ SOLN: 0.4000 mg | INTRAMUSCULAR | Status: DC | PRN

## 2021-12-06 MED ORDER — SODIUM CHLORIDE 0.45 % IV SOLN: INTRAVENOUS | Status: DC

## 2021-12-06 MED ORDER — DIPHENHYDRAMINE HCL 25 MG PO CAPS: 25.0000 mg | ORAL_CAPSULE | ORAL | Status: DC | PRN

## 2021-12-06 MED ORDER — HYDROMORPHONE 1 MG/ML IV SOLN
INTRAVENOUS | Status: DC
  Administered 2021-12-06: 17.8 mg via INTRAVENOUS
  Filled 2021-12-06: qty 30

## 2021-12-06 MED ORDER — ACETAMINOPHEN 500 MG PO TABS
1000.0000 mg | ORAL_TABLET | Freq: Once | ORAL | Status: AC
  Administered 2021-12-06: 1000 mg via ORAL
  Filled 2021-12-06: qty 2

## 2021-12-06 NOTE — Discharge Summary (Signed)
[facility] Discharge Summary   Patient ID: Stephanie Burch MRN: [id number] DOB/AGE: Jun 27, 1980 41 y.o.  Admit date: 12/06/2021 Discharge date: 12/06/2021  Primary Care Physician:  Pcp, No  Admission Diagnoses:  Active Problems:   Sickle cell pain crisis [facility])   Discharge Medications:  Allergies as of 12/06/2021   No Known Allergies      Medication List     TAKE these medications    acetaminophen 500 MG tablet Commonly known as: TYLENOL Take 1,000 mg by mouth every 6 (six) hours as needed for mild pain, moderate pain or headache.   folic acid 1 MG tablet Commonly known as: FOLVITE Take 1 mg by mouth daily.   HYDROmorphone 4 MG tablet Commonly known as: DILAUDID Take 4 mg by mouth every 6 (six) hours as needed for severe pain.   ibuprofen 200 MG tablet Commonly known as: ADVIL Take 800 mg by mouth 3 (three) times daily as needed for fever, headache or mild pain.   Oxbryta 500 MG Tabs tablet Generic drug: voxelotor Take 1,500 mg by mouth daily in the afternoon.   oxycodone 30 MG immediate release tablet Commonly known as: ROXICODONE Take 30 mg by mouth every 4 (four) hours as needed for pain.         Consults:  None  Significant Diagnostic Studies:  ECHOCARDIOGRAM COMPLETE  Result Date: 11/22/2021    ECHOCARDIOGRAM REPORT   Patient Name:   [facility] Date of Exam: 11/22/2021 Medical Rec #:  [id number]     Height:       65.0 in Accession #:    [id number]    Weight:       320.0 lb Date of Birth:  1980/07/07    BSA:          2.415 m Patient Age:    40 years      BP:           134/62 mmHg Patient Gender: F             HR:           82 bpm. Exam Location:  Inpatient Procedure: 2D Echo, Color Doppler and Cardiac Doppler Indications:    Pulmonary Hypertension  History:        Patient has prior history of Echocardiogram examinations, most                 recent 11/15/2019. Arrythmias:Tachycardia.  Sonographer:    Gaynell Face Referring Phys: [id number]  ALEXANDER B MELVIN IMPRESSIONS  1. Left ventricular ejection fraction, by estimation, is 60 to 65%. The left ventricle has normal function. The left ventricle has no regional wall motion abnormalities. Left ventricular diastolic parameters are indeterminate.  2. Right ventricular systolic function is normal. The right ventricular size is normal.  3. The mitral valve is normal in structure. Trivial mitral valve regurgitation.  4. The aortic valve is tricuspid. Aortic valve regurgitation is not visualized. Aortic valve sclerosis/calcification is present, without any evidence of aortic stenosis. FINDINGS  Left Ventricle: Left ventricular ejection fraction, by estimation, is 60 to 65%. The left ventricle has normal function. The left ventricle has no regional wall motion abnormalities. The left ventricular internal cavity size was normal in size. There is  no left ventricular hypertrophy. Left ventricular diastolic parameters are indeterminate. Right Ventricle: The right ventricular size is normal. No increase in right ventricular wall thickness. Right ventricular systolic function is normal. Left Atrium: Left atrial size was normal in size. Right Atrium: Right  atrial size was normal in size. Pericardium: There is no evidence of pericardial effusion. Mitral Valve: The mitral valve is normal in structure. Trivial mitral valve regurgitation. Tricuspid Valve: The tricuspid valve is normal in structure. Tricuspid valve regurgitation is trivial. Aortic Valve: The aortic valve is tricuspid. Aortic valve regurgitation is not visualized. Aortic valve sclerosis/calcification is present, without any evidence of aortic stenosis. Aortic valve mean gradient measures 5.0 mmHg. Aortic valve peak gradient measures 9.6 mmHg. Aortic valve area, by VTI measures 4.32 cm. Pulmonic Valve: The pulmonic valve was not well visualized. Pulmonic valve regurgitation is not visualized. No evidence of pulmonic stenosis. Aorta: The aortic root is  normal in size and structure. IAS/Shunts: No atrial level shunt detected by color flow Doppler.  LEFT VENTRICLE PLAX 2D LVIDd:         4.80 cm   Diastology LVIDs:         3.10 cm   LV e' medial:    6.09 cm/s LV PW:         1.10 cm   LV E/e' medial:  17.4 LV IVS:        1.00 cm   LV e' lateral:   5.66 cm/s LVOT diam:     2.80 cm   LV E/e' lateral: 18.7 LV SV:         106 LV SV Index:   44 LVOT Area:     6.16 cm  RIGHT VENTRICLE TAPSE (M-mode): 2.3 cm LEFT ATRIUM             Index        RIGHT ATRIUM           Index LA diam:        4.00 cm 1.66 cm/m   RA Area:     12.00 cm LA Vol (A2C):   76.7 ml 31.75 ml/m  RA Volume:   24.30 ml  10.06 ml/m LA Vol (A4C):   57.4 ml 23.76 ml/m LA Biplane Vol: 67.7 ml 28.03 ml/m  AORTIC VALVE AV Area (Vmax):    3.96 cm AV Area (Vmean):   3.99 cm AV Area (VTI):     4.32 cm AV Vmax:           155.00 cm/s AV Vmean:          97.700 cm/s AV VTI:            0.245 m AV Peak Grad:      9.6 mmHg AV Mean Grad:      5.0 mmHg LVOT Vmax:         99.70 cm/s LVOT Vmean:        63.300 cm/s LVOT VTI:          0.172 m LVOT/AV VTI ratio: 0.70  AORTA Ao Root diam: 3.50 cm Ao Asc diam:  3.00 cm MITRAL VALVE MV Area (PHT): 4.06 cm     SHUNTS MV Decel Time: 187 msec     Systemic VTI:  0.17 m MV E velocity: 106.00 cm/s  Systemic Diam: 2.80 cm MV A velocity: 108.00 cm/s MV E/A ratio:  0.98 Dorris Carnes MD Electronically signed by Dorris Carnes MD Signature Date/Time: 11/22/2021/4:03:46 PM    Final    DG Chest Portable 1 View  Result Date: 11/21/2021 CLINICAL DATA:  Chest pain EXAM: PORTABLE CHEST 1 VIEW COMPARISON:  04/02/21 FINDINGS: No pleural effusion. No pneumothorax. Cardiomegaly. No focal airspace opacity. Prominent interstitial opacities, could be seen in the setting of pulmonary venous congestion  without overt pulmonary edema. No displaced rib fractures. Visualized upper abdomen is unremarkable. IMPRESSION: 1. No radiographic finding to explain chest pain. 2. Cardiomegaly with pulmonary venous  congestion. No overt pulmonary edema. No focal airspace opacity. Electronically Signed   By: Lorenza Cambridge M.D.   On: 11/21/2021 16:13   CT Angio Chest PE W and/or Wo Contrast  Result Date: 11/21/2021 CLINICAL DATA:  Chest pain, pulmonary embolism suspected. EXAM: CT ANGIOGRAPHY CHEST WITH CONTRAST TECHNIQUE: Multidetector CT imaging of the chest was performed using the standard protocol during bolus administration of intravenous contrast. Multiplanar CT image reconstructions and MIPs were obtained to evaluate the vascular anatomy. RADIATION DOSE REDUCTION: This exam was performed according to the departmental dose-optimization program which includes automated exposure control, adjustment of the mA and/or kV according to patient size and/or use of iterative reconstruction technique. CONTRAST:  OMNIPAQUE IOHEXOL 350 MG/ML SOLN COMPARISON:  CT examination dated April 02, 2021 FINDINGS: Cardiovascular: Satisfactory opacification of the pulmonary arteries to the segmental level. No evidence of pulmonary embolism. Evaluation of distal segmental and subsegmental pulmonary arteries is limited due to phase of contrast enhancement and respiratory motion. Heart is mildly enlarged. Main pulmonary trunk is dilated measuring up to 3.2 cm concerning for pulmonary arterial hypertension. No pericardial effusion. Mediastinum/Nodes: No enlarged mediastinal, hilar, or axillary lymph nodes. Thyroid gland, trachea, and esophagus demonstrate no significant findings. Lungs/Pleura: No focal consolidation or pleural effusion. Low lung volumes. Bibasilar dependent atelectasis. 6 mm nodule in the superior aspect of the right middle lobe (series 6, image 58). Upper Abdomen: No acute abnormality. Musculoskeletal: No chest wall abnormality. No acute or significant osseous findings. Review of the MIP images confirms the above findings. IMPRESSION: 1. No evidence of central pulmonary embolism. Evaluation of distal segmental and  subsegmental pulmonary arteries is limited due to phase of contrast enhancement and respiratory motion. 2. Mild cardiomegaly. 3. Main pulmonary trunk is dilated measuring up to 3.2 cm concerning for pulmonary arterial hypertension. 4. Low lung volumes with bibasilar dependent atelectasis. No focal consolidation or pleural effusion. 5. 6 mm nodule in the superior aspect of the right middle lobe, unchanged. Electronically Signed   By: Larose Hires D.O.   On: 11/21/2021 15:44    History of Present Illness: Javier Mamone is a 41 year old female with a medical history significant for sickle.cell disease, chronic pain syndrome, opiate dependence and tolerance, morbid obesity, and history of anemia of chronic disease presents with complaints of lower extremity pain. Pain is consistent with typical sickle cell crisis. Pain has not been relieved by home medication regimen. Patient attributes pain crisis to changes in weather. Patient rates pain as 9/10, constant, and throbbing.  She last had oxycodone this a.m. without sustained relief.  She denies headache, fever, chills, or chest pain.  No shortness of breath, dizziness, nausea, vomiting, or diarrhea.  No recent travel, sick contacts, or known exposure to COVID-19.  [facility] Course: Patient admitted to sickle cell day infusion clinic for management of pain crisis. Reviewed all laboratory values, unremarkable. Pain managed with IV Dilaudid PCA Toradol 15 mg IV x1 Tylenol 1000 mg by mouth x1 0.45% saline at 100 mL/h Pain intensity decreased throughout admission and patient will be discharged home.  Patient advised to resume home medications.  She is alert, oriented, and ambulating without assistance. She was discharged home in a hemodynamically stable condition.  Discharge instructions: Resume all home medications.   Follow up with PCP as previously  scheduled.  Discussed the importance of drinking 64 ounces of water daily, dehydration  of red blood cells may lead further sickling.   Avoid all stressors that precipitate sickle cell pain crisis.     The patient was given clear instructions to go to ER or return to medical center if symptoms do not improve, worsen or new problems develop.   Physical Exam at Discharge:  BP 115/61 (BP Location: Right Leg)   Pulse 82   Temp (!) 97.5 F (36.4 C) (Temporal)   Resp 12   LMP 10/31/2021   SpO2 92%   Physical Exam Constitutional:      Appearance: Normal appearance. She is obese.  Eyes:     Pupils: Pupils are equal, round, and reactive to light.  Cardiovascular:     Rate and Rhythm: Normal rate and regular rhythm.     Pulses: Normal pulses.  Pulmonary:     Effort: Pulmonary effort is normal.  Abdominal:     General: Bowel sounds are normal.  Skin:    General: Skin is warm.  Neurological:     General: No focal deficit present.     Mental Status: She is alert. Mental status is at baseline.  Psychiatric:        Mood and Affect: Mood normal.        Behavior: Behavior normal.        Thought Content: Thought content normal.        Judgment: Judgment normal.      Disposition at Discharge: Discharge disposition: 01-Home or Self Care       Discharge Orders: Discharge Instructions     Discharge patient   Complete by: As directed    Discharge disposition: 01-Home or Self Care   Discharge patient date: 12/06/2021       Condition at Discharge:   Stable  Time spent on Discharge:  Greater than 30 minutes.  Signed: Nolon Nations  APRN, MSN, FNP-C Patient Care [facility] [address] [place], [place] [address] [phone]  12/06/2021, 4:03 PM

## 2021-12-06 NOTE — Progress Notes (Signed)
Patient admitted to the day infusion hospital for sickle cell pain. Initially, patient reported bilateral leg pain rated 8/10. For pain management, patient placed on Dilaudid PCA, given Tylenol, IV Toradol and hydrated with IV fluids. At discharge, patient rated pain at 6/10. Vital signs stable. AVS offered but patient refused. Patient alert, oriented and ambulatory at discharge.

## 2021-12-06 NOTE — Telephone Encounter (Signed)
Patient called requesting to come to the day hospital for sickle cell pain. Patient reports bilateral leg pain rated 8/10. Reports taking Oxycodone at 3:00 am. COVID-19 screening done and patient denies all symptoms and exposures. Denies fever, chest pain, nausea, vomiting, diarrhea and abdominal pain. Admits to having transportation without driving self. Per patient, her friend will provider transportation for her. [place], [place] notified. Patient can come to the day hospital for pain management. Patient advised and expresses an understanding.

## 2021-12-07 ENCOUNTER — Encounter (HOSPITAL_COMMUNITY): Payer: Self-pay | Admitting: *Deleted

## 2021-12-07 ENCOUNTER — Other Ambulatory Visit: Payer: Self-pay

## 2021-12-07 ENCOUNTER — Emergency Department (HOSPITAL_COMMUNITY): Payer: Medicare Other

## 2021-12-07 ENCOUNTER — Inpatient Hospital Stay (HOSPITAL_COMMUNITY)
Admission: EM | Admit: 2021-12-07 | Discharge: 2021-12-10 | DRG: 812 | Disposition: A | Discharge status: Home / Self Care | Payer: Medicare Other | Attending: Internal Medicine | Admitting: Internal Medicine

## 2021-12-07 DIAGNOSIS — D72829 Elevated white blood cell count, unspecified: Secondary | ICD-10-CM | POA: Diagnosis present

## 2021-12-07 DIAGNOSIS — Z8616 Personal history of COVID-19: Secondary | ICD-10-CM

## 2021-12-07 DIAGNOSIS — Z713 Dietary counseling and surveillance: Secondary | ICD-10-CM

## 2021-12-07 DIAGNOSIS — F1120 Opioid dependence, uncomplicated: Secondary | ICD-10-CM | POA: Diagnosis present

## 2021-12-07 DIAGNOSIS — Z832 Family history of diseases of the blood and blood-forming organs and certain disorders involving the immune mechanism: Secondary | ICD-10-CM

## 2021-12-07 DIAGNOSIS — D638 Anemia in other chronic diseases classified elsewhere: Secondary | ICD-10-CM | POA: Diagnosis present

## 2021-12-07 DIAGNOSIS — Z6841 Body Mass Index (BMI) 40.0 and over, adult: Secondary | ICD-10-CM

## 2021-12-07 DIAGNOSIS — G894 Chronic pain syndrome: Secondary | ICD-10-CM | POA: Diagnosis present

## 2021-12-07 DIAGNOSIS — D5700 Hb-SS disease with crisis, unspecified: Principal | ICD-10-CM | POA: Diagnosis present

## 2021-12-07 LAB — COMPREHENSIVE METABOLIC PANEL
ALT: 21 U/L (ref 0–44)
AST: 20 U/L (ref 15–41)
Albumin: 4.6 g/dL (ref 3.5–5.0)
Alkaline Phosphatase: 80 U/L (ref 38–126)
Anion gap: 7 (ref 5–15)
BUN: 11 mg/dL (ref 6–20)
CO2: 22 mmol/L (ref 22–32)
Calcium: 10.1 mg/dL (ref 8.9–10.3)
Chloride: 110 mmol/L (ref 98–111)
Creatinine, Ser: 0.67 mg/dL (ref 0.44–1.00)
GFR, Estimated: >60 mL/min (ref >60)
Glucose, Bld: 126 mg/dL — ABNORMAL HIGH (ref 70–99)
Potassium: 3.9 mmol/L (ref 3.5–5.1)
Sodium: 139 mmol/L (ref 135–145)
Total Bilirubin: 1 mg/dL (ref 0.3–1.2)
Total Protein: 8.9 g/dL — ABNORMAL HIGH (ref 6.5–8.1)

## 2021-12-07 LAB — CBC WITH DIFFERENTIAL/PLATELET
Abs Immature Granulocytes: 0.03 10*3/uL (ref 0.00–0.07)
Basophils Absolute: 0 10*3/uL (ref 0.0–0.1)
Basophils Relative: 0 %
Eosinophils Absolute: 0.1 10*3/uL (ref 0.0–0.5)
Eosinophils Relative: 1 %
HCT: 41.5 % (ref 36.0–46.0)
Hemoglobin: 14.3 g/dL (ref 12.0–15.0)
Immature Granulocytes: 0 %
Lymphocytes Relative: 18 %
Lymphs Abs: 2.1 10*3/uL (ref 0.7–4.0)
MCH: 24.7 pg — ABNORMAL LOW (ref 26.0–34.0)
MCHC: 34.5 g/dL (ref 30.0–36.0)
MCV: 71.7 fL — ABNORMAL LOW (ref 80.0–100.0)
Monocytes Absolute: 0.5 10*3/uL (ref 0.1–1.0)
Monocytes Relative: 5 %
Neutro Abs: 8.7 10*3/uL — ABNORMAL HIGH (ref 1.7–7.7)
Neutrophils Relative %: 76 %
Platelets: 347 10*3/uL (ref 150–400)
RBC: 5.79 MIL/uL — ABNORMAL HIGH (ref 3.87–5.11)
RDW: 14.9 % (ref 11.5–15.5)
WBC: 11.4 10*3/uL — ABNORMAL HIGH (ref 4.0–10.5)
nRBC: 0.2 % (ref 0.0–0.2)

## 2021-12-07 LAB — RETICULOCYTES
Immature Retic Fract: 22 % — ABNORMAL HIGH (ref 2.3–15.9)
RBC.: 5.73 MIL/uL — ABNORMAL HIGH (ref 3.87–5.11)
Retic Count, Absolute: 161 10*3/uL (ref 19.0–186.0)
Retic Ct Pct: 2.8 % (ref 0.4–3.1)

## 2021-12-07 MED ORDER — DIPHENHYDRAMINE HCL 25 MG PO CAPS: 25.0000 mg | ORAL_CAPSULE | ORAL | Status: DC | PRN

## 2021-12-07 MED ORDER — SODIUM CHLORIDE 0.9% FLUSH: 9.0000 mL | INTRAVENOUS | Status: DC | PRN

## 2021-12-07 MED ORDER — SODIUM CHLORIDE 0.45 % IV SOLN: INTRAVENOUS | Status: DC

## 2021-12-07 MED ORDER — SODIUM CHLORIDE 0.9 % IV SOLN
12.5000 mg | Freq: Once | INTRAVENOUS | Status: AC
  Administered 2021-12-07: 12.5 mg via INTRAVENOUS
  Filled 2021-12-07: qty 12.5

## 2021-12-07 MED ORDER — ONDANSETRON 4 MG PO TBDP
4.0000 mg | ORAL_TABLET | Freq: Once | ORAL | Status: AC
  Administered 2021-12-07: 4 mg via ORAL
  Filled 2021-12-07: qty 1

## 2021-12-07 MED ORDER — HYDROMORPHONE HCL 2 MG/ML IJ SOLN
2.0000 mg | INTRAMUSCULAR | Status: AC
  Administered 2021-12-07: 2 mg via INTRAVENOUS
  Filled 2021-12-07: qty 1

## 2021-12-07 MED ORDER — SENNOSIDES-DOCUSATE SODIUM 8.6-50 MG PO TABS
1.0000 | ORAL_TABLET | Freq: Two times a day (BID) | ORAL | Status: DC
  Filled 2021-12-07 (×5): qty 1

## 2021-12-07 MED ORDER — NALOXONE HCL 0.4 MG/ML IJ SOLN: 0.4000 mg | INTRAMUSCULAR | Status: DC | PRN

## 2021-12-07 MED ORDER — POLYETHYLENE GLYCOL 3350 17 G PO PACK: 17.0000 g | PACK | Freq: Every day | ORAL | Status: DC | PRN

## 2021-12-07 MED ORDER — ONDANSETRON HCL 4 MG/2ML IJ SOLN
4.0000 mg | INTRAMUSCULAR | Status: DC | PRN
  Administered 2021-12-07 – 2021-12-08 (×2): 4 mg via INTRAVENOUS
  Filled 2021-12-07 (×2): qty 2

## 2021-12-07 MED ORDER — HYDROMORPHONE HCL 2 MG/ML IJ SOLN
2.0000 mg | INTRAMUSCULAR | Status: DC | PRN
  Administered 2021-12-07: 2 mg via INTRAVENOUS
  Filled 2021-12-07: qty 1

## 2021-12-07 MED ORDER — ENOXAPARIN SODIUM 40 MG/0.4ML IJ SOSY
40.0000 mg | PREFILLED_SYRINGE | INTRAMUSCULAR | Status: DC
  Administered 2021-12-08 – 2021-12-10 (×3): 40 mg via SUBCUTANEOUS
  Filled 2021-12-07 (×3): qty 0.4

## 2021-12-07 MED ORDER — OXYCODONE HCL 5 MG PO TABS
30.0000 mg | ORAL_TABLET | Freq: Once | ORAL | Status: AC
  Administered 2021-12-07: 30 mg via ORAL
  Filled 2021-12-07 (×2): qty 6

## 2021-12-07 MED ORDER — HYDROMORPHONE 1 MG/ML IV SOLN
INTRAVENOUS | Status: DC
  Filled 2021-12-07: qty 30

## 2021-12-07 MED ORDER — KETOROLAC TROMETHAMINE 15 MG/ML IJ SOLN
15.0000 mg | Freq: Four times a day (QID) | INTRAMUSCULAR | Status: DC
  Administered 2021-12-08 – 2021-12-10 (×11): 15 mg via INTRAVENOUS
  Filled 2021-12-07 (×11): qty 1

## 2021-12-07 MED ORDER — HYDROMORPHONE HCL 2 MG/ML IJ SOLN
2.0000 mg | Freq: Once | INTRAMUSCULAR | Status: AC
  Administered 2021-12-07: 2 mg via SUBCUTANEOUS
  Filled 2021-12-07: qty 1

## 2021-12-07 NOTE — ED Provider Notes (Signed)
Patients care assumed from [facility], [place] at 6 PM.  Patient has past medical history of sickle cell.  Patient has hemoglobin SS disease.  Patient was seen at the sickle cell clinic yesterday.  Patient reports after going home she continued to have severe pain.  Patient's laboratory evaluations obtained.  Patient has a hemoglobin of 14.3 slightly elevated Wimmer blood cell count of 11.4 his glucose is elevated at 126.  On reevaluation patient remains tachycardic.  Complains of persistent pain.  Patient does not feel that she can manage her pain at home.  Patient request inpatient admission for pain management.    Sidney Ace 12/07/21 2139    Valarie Merino, MD 12/08/21 [phone]

## 2021-12-07 NOTE — ED Triage Notes (Signed)
Here from home for sickle cell pain crisis, onset Thursday, seen at clinic yesterday, no relief with home oxycodone or dilaudid, associated with sickle cell pain crisis, BLE pain. "Feels she needs to be admitted". Rates 10/10, tearful. Denies fever, CP or SOB, or NV.

## 2021-12-07 NOTE — ED Notes (Signed)
2L O2 applied.

## 2021-12-07 NOTE — ED Provider Notes (Signed)
[facility]-EMERGENCY DEPT Provider Note   CSN: [id number] Arrival date & time: 12/07/21  1539     History  Chief Complaint  Patient presents with   Sickle Cell Pain Crisis    Stephanie Burch is a 41 y.o. female.   Sickle Cell Pain Crisis    Patient with medical history of sickle anemia presents today due to lower extremity pain x2 to 3 days.  She has been taking her home medicine with no improvement.  She went to sickle cell clinic yesterday and had treatment there but still is not feeling much better today.  The pain is to her lower extremities.  She denies any chest pain, shortness of breath, abdominal pain, nausea, vomiting, cough, fever, runny nose.  Unsure what the trigger is of the pain.  Home Medications Prior to Admission medications   Medication Sig Start Date End Date Taking? Authorizing Provider  acetaminophen (TYLENOL) 500 MG tablet Take 1,000 mg by mouth every 6 (six) hours as needed for mild pain, moderate pain or headache.    [provider]  folic acid (FOLVITE) 1 MG tablet Take 1 mg by mouth daily.    [provider]  HYDROmorphone (DILAUDID) 4 MG tablet Take 4 mg by mouth every 6 (six) hours as needed for severe pain.    [provider]  ibuprofen (ADVIL) 200 MG tablet Take 800 mg by mouth 3 (three) times daily as needed for fever, headache or mild pain.    [provider]  OXBRYTA 500 MG TABS tablet Take 1,500 mg by mouth daily in the afternoon. 10/04/21   [provider]  oxycodone (ROXICODONE) 30 MG immediate release tablet Take 30 mg by mouth every 4 (four) hours as needed for pain.    [provider]      Allergies    Patient has no known allergies.    Review of Systems   Review of Systems  Physical Exam Updated Vital Signs BP (!) 144/97   Pulse 95   Resp (!) 27   Wt (!) 145.2 kg   LMP 11/30/2021 (Exact Date)   SpO2 100%   BMI 53.25 kg/m  Physical Exam Vitals and nursing  note reviewed. Exam conducted with a chaperone present.  Constitutional:      Appearance: Normal appearance.  HENT:     Head: Normocephalic and atraumatic.  Eyes:     General: No scleral icterus.       Right eye: No discharge.        Left eye: No discharge.     Extraocular Movements: Extraocular movements intact.     Pupils: Pupils are equal, round, and reactive to light.  Cardiovascular:     Rate and Rhythm: Normal rate and regular rhythm.     Pulses: Normal pulses.     Heart sounds: Normal heart sounds. No murmur heard.    No friction rub. No gallop.  Pulmonary:     Effort: Pulmonary effort is normal. No respiratory distress.     Breath sounds: Normal breath sounds.  Abdominal:     General: Abdomen is flat. Bowel sounds are normal. There is no distension.     Palpations: Abdomen is soft.     Tenderness: There is no abdominal tenderness.  Skin:    General: Skin is warm and dry.     Coloration: Skin is not jaundiced.  Neurological:     Mental Status: She is alert. Mental status is at baseline.  Coordination: Coordination normal.     ED Results / Procedures / Treatments   Labs (all labs ordered are listed, but only abnormal results are displayed) Labs Reviewed  COMPREHENSIVE METABOLIC PANEL  CBC WITH DIFFERENTIAL/PLATELET  RETICULOCYTES  PREGNANCY, URINE    EKG None  Radiology No results found.  Procedures Procedures    Medications Ordered in ED Medications  oxyCODONE (Oxy IR/ROXICODONE) immediate release tablet 30 mg (has no administration in time range)  HYDROmorphone (DILAUDID) injection 2 mg (has no administration in time range)  HYDROmorphone (DILAUDID) injection 2 mg (has no administration in time range)  HYDROmorphone (DILAUDID) injection 2 mg (has no administration in time range)  diphenhydrAMINE (BENADRYL) 12.5 mg in sodium chloride 0.9 % 50 mL IVPB (has no administration in time range)  ondansetron (ZOFRAN) injection 4 mg (has no administration  in time range)    ED Course/ Medical Decision Making/ A&P                            Patient presents due to lower extremity pain.  Differential includes acute on chronic pain, sickle cell anemia with pain, aplastic anemia.  She has no symptoms to be she does have acute chest syndrome, she specifically denies any chest pain, shortness of breath.  She is not hypoxic.  I also reviewed labs from yesterday, she had actually was not anemic surprisingly.  Her retics are responding.  I will repeat labs today although aplastic anemia seems unlikely.  Unfortunately I been unable to get labs.  Entire work-up is pending including EKG and treatment.  I suspect patient will likely need admission for sickle cell pain given she tried treatment today clinic yesterday with no improvement of the pain is still worsening.    Patient case discussed with PA Threasa Alpha who will follow up.        Final Clinical Impression(s) / ED Diagnoses Final diagnoses:  None    Rx / DC Orders ED Discharge Orders     None         Sherrill Raring, [place] 12/07/21 1740    Valarie Merino, MD 12/08/21 1303

## 2021-12-07 NOTE — H&P (Signed)
History and Physical    Patient: Stephanie Burch ERX:[id number] DOB: 1980/11/29 DOA: 12/07/2021 DOS: the patient was seen and examined on 12/08/2021 PCP: Pcp, No  Patient coming from: Home  Chief Complaint:  Chief Complaint  Patient presents with   Sickle Cell Pain Crisis   HPI: Stephanie Burch is a 41 y.o. female with medical history significant of sickle cell disease, chronic pain syndrome, morbid obesity, opioid dependence and tolerance presenting with sickle cell pain crisis.  Presents with pain to her LE which are her usual sites for sickle cell pain crisis. Denies any chest pain, shortness of breath.  Patient was just seen yesterday at sickle cell clinic but had return of pain not controlled by home medicine.   In the ED, she was afebrile, heart rate of 104, RR 22 with BP of 140/108.  WBC of 11.4, hemoglobin 14.3  Sodium 139, K of 3.9, creatinine of 0.67, CBG of 126.  Chest x-ray shows mild bilateral lower lobe atelectasis.  Patient was given up to 6 mg doses of IV Dilaudid and had continual pain so hospitalist was consulted for further management. Review of Systems: As mentioned in the history of present illness. All other systems reviewed and are negative. Past Medical History:  Diagnosis Date   Acute kidney injury ([place]) 11/07/2019   Anemia    Chronic pain    COVID-19 virus detected 10/22/2019   Depression    HCAP (healthcare-associated pneumonia) 11/07/2019   History of blood transfusion 2013   x 2   HSV infection    Hypoxia    Pneumonia    x 1   Pulmonary edema 11/11/2019   Sepsis ([place]) 11/07/2019   Sickle cell anemia ([place]) 09/2021   gets them frequently   Tachycardia with heart rate 100-120 beats per minute    Vitamin B12 deficiency 12/2018   Vitamin D deficiency    Past Surgical History:  Procedure Laterality Date   CESAREAN SECTION  2013   x 1   TOOTH EXTRACTION N/A 10/18/2021   Procedure: DENTAL RESTORATION/EXTRACTIONS;  Surgeon: Diona Browner, DMD;   Location: [facility];  Service: Oral Surgery;  Laterality: N/A;   Social History:  reports that she has never smoked. She has never used smokeless tobacco. She reports that she does not currently use alcohol. She reports that she does not use drugs.  No Known Allergies  Family History  Problem Relation Age of Onset   Hypertension Mother    Sickle cell trait Mother    Diabetes Father    Sickle cell trait Father     Prior to Admission medications   Medication Sig Start Date End Date Taking? Authorizing Provider  acetaminophen (TYLENOL) 500 MG tablet Take 1,000 mg by mouth every 6 (six) hours as needed for mild pain, moderate pain or headache.   Yes [provider]  folic acid (FOLVITE) 1 MG tablet Take 1 mg by mouth daily.   Yes [provider]  HYDROmorphone (DILAUDID) 4 MG tablet Take 4 mg by mouth every 6 (six) hours as needed for severe pain.   Yes [provider]  ibuprofen (ADVIL) 200 MG tablet Take 800 mg by mouth 3 (three) times daily as needed for fever, headache or mild pain.   Yes [provider]  OXBRYTA 500 MG TABS tablet Take 1,500 mg by mouth daily in the afternoon. 10/04/21  Yes [provider]  oxycodone (ROXICODONE) 30 MG immediate release tablet Take 30 mg by mouth every 4 (four) hours as  needed for pain.   Yes [provider]    Physical Exam: Vitals:   12/07/21 2348 12/08/21 0016 12/08/21 0032 12/08/21 0136  BP: (!) 146/100     Pulse: (!) 104     Resp: 20 20  20   Temp: 98.6 F (37 C)     TempSrc: Oral     SpO2: 96% 96%  96%  Weight:   (!) 146.7 kg   Height:   5\' 5"  (1.651 m)    Constitutional: NAD, calm, comfortable, morbidly obese female sitting upright in bed looking at her cell phone Eyes:  lids and conjunctivae normal ENMT: Mucous membranes are moist. Neck: normal, supple,  Respiratory: clear to auscultation bilaterally, no wheezing, no crackles. Normal respiratory effort. No accessory muscle use.   Cardiovascular: Regular rate and rhythm, no murmurs / rubs / gallops. No extremity edema.   Abdomen: no tenderness,. Bowel sounds positive.  Musculoskeletal: no clubbing / cyanosis. No joint deformity upper and lower extremities. Good ROM, no contractures. Normal muscle tone.  Skin: no rashes, lesions, ulcers.  Neurologic: CN 2-12 grossly intact. Strength 5/5 in all 4.  Psychiatric: Normal judgment and insight. Alert and oriented x 3. Normal mood. Data Reviewed:  See HPI  Assessment and Plan: Sickle cell crisis (HCC) Continues 0.45% IV fluids  IV Dilaudid via PCA with settings of 0.5mg , 10-minute lockout with max of 3mg /hr Toradol 15mg  q6HR Monitor vital signs closely and re-evaluate pain scale    BMI 50.0-59.9, adult (HCC) BMI of 53  Leukocytosis Likely reactive to sickle cell crisis. No symptoms or signs of obvious infection.      Advance Care Planning:   Code Status: Full Code Full  Consults: none  Family Communication: none at bedside  Severity of Illness: The appropriate patient status for this patient is OBSERVATION. Observation status is judged to be reasonable and necessary in order to provide the required intensity of service to ensure the patient's safety. The patient's presenting symptoms, physical exam findings, and initial radiographic and laboratory data in the context of their medical condition is felt to place them at decreased risk for further clinical deterioration. Furthermore, it is anticipated that the patient will be medically stable for discharge from the hospital within 2 midnights of admission.   Author: , DO 12/08/2021 3:16 AM  For on call review www. .

## 2021-12-07 NOTE — ED Provider Triage Note (Signed)
Emergency Medicine Provider Triage Evaluation Note  Kylina Vultaggio , a 41 y.o. female  was evaluated in triage.  Pt complains of sickle cell pain.  Patient states that she typically feels sickle cell pain in bilateral lower legs around the knee.  She reports no known trauma to affected area.  She states that sickle cell pain is been present since Thursday of this week.  She was seen at the sickle cell clinic yesterday with some relief of symptoms but symptoms returned upon awakening this morning and were worsening.  She has not taken any of her at home pain medication today.  Denies chest pain, shortness of breath, fever.   Review of Systems  Positive: See above Negative:   Physical Exam  LMP 10/31/2021  Gen:   Awake, no distress   Resp:  Normal effort  MSK:   Moves extremities without difficulty  Other:    Medical Decision Making  Medically screening exam initiated at 4:16 PM.  Appropriate orders placed.  Araceli Marasigan was informed that the remainder of the evaluation will be completed by another provider, this initial triage assessment does not replace that evaluation, and the importance of remaining in the ED until their evaluation is complete.     Wilnette Kales, [place] 12/07/21 1625

## 2021-12-08 DIAGNOSIS — D5700 Hb-SS disease with crisis, unspecified: Secondary | ICD-10-CM | POA: Diagnosis present

## 2021-12-08 DIAGNOSIS — D638 Anemia in other chronic diseases classified elsewhere: Secondary | ICD-10-CM | POA: Diagnosis present

## 2021-12-08 DIAGNOSIS — F1120 Opioid dependence, uncomplicated: Secondary | ICD-10-CM | POA: Diagnosis present

## 2021-12-08 DIAGNOSIS — Z8616 Personal history of COVID-19: Secondary | ICD-10-CM | POA: Diagnosis not present

## 2021-12-08 DIAGNOSIS — Z713 Dietary counseling and surveillance: Secondary | ICD-10-CM | POA: Diagnosis not present

## 2021-12-08 DIAGNOSIS — G894 Chronic pain syndrome: Secondary | ICD-10-CM | POA: Diagnosis present

## 2021-12-08 DIAGNOSIS — Z832 Family history of diseases of the blood and blood-forming organs and certain disorders involving the immune mechanism: Secondary | ICD-10-CM | POA: Diagnosis not present

## 2021-12-08 DIAGNOSIS — Z6841 Body Mass Index (BMI) 40.0 and over, adult: Secondary | ICD-10-CM | POA: Diagnosis not present

## 2021-12-08 DIAGNOSIS — D72829 Elevated white blood cell count, unspecified: Secondary | ICD-10-CM | POA: Diagnosis present

## 2021-12-08 LAB — CBC
HCT: 37 % (ref 36.0–46.0)
Hemoglobin: 12.3 g/dL (ref 12.0–15.0)
MCH: 24.2 pg — ABNORMAL LOW (ref 26.0–34.0)
MCHC: 33.2 g/dL (ref 30.0–36.0)
MCV: 72.7 fL — ABNORMAL LOW (ref 80.0–100.0)
Platelets: 296 K/uL (ref 150–400)
RBC: 5.09 MIL/uL (ref 3.87–5.11)
RDW: 14.8 % (ref 11.5–15.5)
WBC: 15.8 K/uL — ABNORMAL HIGH (ref 4.0–10.5)
nRBC: 0.4 % — ABNORMAL HIGH (ref 0.0–0.2)

## 2021-12-08 LAB — PREGNANCY, URINE: Preg Test, Ur: NEGATIVE

## 2021-12-08 MED ORDER — FOLIC ACID 1 MG PO TABS
1.0000 mg | ORAL_TABLET | Freq: Every day | ORAL | Status: DC
  Administered 2021-12-09 – 2021-12-10 (×2): 1 mg via ORAL
  Filled 2021-12-08 (×2): qty 1

## 2021-12-08 MED ORDER — VOXELOTOR 500 MG PO TABS: 1500.0000 mg | ORAL_TABLET | Freq: Every day | ORAL | Status: DC

## 2021-12-08 MED ORDER — HYDROMORPHONE 1 MG/ML IV SOLN
INTRAVENOUS | Status: DC
  Administered 2021-12-08: 7.5 mg via INTRAVENOUS
  Administered 2021-12-08: 13.5 mg via INTRAVENOUS
  Administered 2021-12-08: 7.5 mg via INTRAVENOUS
  Administered 2021-12-09: 3.5 mg via INTRAVENOUS
  Administered 2021-12-09: 2.5 mg via INTRAVENOUS
  Administered 2021-12-09: 5 mg via INTRAVENOUS
  Administered 2021-12-09: 7.5 mg via INTRAVENOUS
  Filled 2021-12-08 (×2): qty 30

## 2021-12-08 NOTE — Progress Notes (Signed)
SICKLE CELL SERVICE PROGRESS NOTE  Stephanie Burch NOM:[id number] DOB: 03/01/1980 DOA: 12/07/2021 PCP: Pcp, No  Assessment/Plan: Active Problems:   Leukocytosis   BMI 50.0-59.9, adult (HCC)   Sickle cell crisis (HCC)  Sickle cell pain crisis: Patient has been dealing with sickle cell crisis over the last week.  She has been to the day hospital only 2 days ago.  Now admitted with crisis.  Currently on Dilaudid PCA and Toradol.  Patient normally takes oxycodone 30 mg tablets and hydrocodone Morform.  Also ibuprofen at home.  She uses [place] and folic acid as well.  At this point we will continue with the Dilaudid PCA and Toradol. Leukocytosis: No clear cause.  Continue to monitor Morbid obesity: Dietary counseling.   Code Status: Full code Family Communication: No family at bedside Disposition Plan: Home  [facility]  Pager [phone]. If 7PM-7AM, please contact night-coverage.  12/08/2021, 6:17 AM  LOS: 0 days   Brief narrative: Stephanie Burch is a 41 y.o. female with medical history significant of sickle cell disease, chronic pain syndrome, morbid obesity, opioid dependence and tolerance presenting with sickle cell pain crisis.   Presents with pain to her LE which are her usual sites for sickle cell pain crisis. Denies any chest pain, shortness of breath.  Patient was just seen yesterday at sickle cell clinic but had return of pain not controlled by home medicine.    In the ED, she was afebrile, heart rate of 104, RR 22 with BP of 140/108.   WBC of 11.4, hemoglobin 14.3   Sodium 139, K of 3.9, creatinine of 0.67, CBG of 126.   Chest x-ray shows mild bilateral lower lobe atelectasis.  Consultants: None  Procedures: Chest x-ray  Antibiotics: None  HPI/Subjective: Patient's pain is still at 8 out of 10.  She was admitted overnight with suspected sickle cell crisis.  No other complaints.  Objective: Vitals:   12/08/21 0032 12/08/21 0136 12/08/21 0402 12/08/21 0613  BP:    (!) 132/111   Pulse:   95   Resp:  20 18 14   Temp:   97.8 F (36.6 C)   TempSrc:   Oral   SpO2:  96% 95% 97%  Weight: (!) 146.7 kg     Height: 5\' 5"  (1.651 m)      Weight change:   Intake/Output Summary (Last 24 hours) at 12/08/2021 0617 Last data filed at 12/08/2021 0253 Gross per 24 hour  Intake 832.91 ml  Output --  Net 832.91 ml    General: Alert, awake, oriented x3, in no acute distress.  HEENT: [place]/AT PEERL, EOMI Neck: Trachea midline,  no masses, no thyromegal,y no JVD, no carotid bruit OROPHARYNX:  Moist, No exudate/ erythema/lesions.  Heart: Regular rate and rhythm, without murmurs, rubs, gallops, PMI non-displaced, no heaves or thrills on palpation.  Lungs: Clear to auscultation, no wheezing or rhonchi noted. No increased vocal fremitus resonant to percussion  Abdomen: Soft, nontender, nondistended, positive bowel sounds, no masses no hepatosplenomegaly noted..  Neuro: No focal neurological deficits noted cranial nerves II through XII grossly intact. DTRs 2+ bilaterally upper and lower extremities. Strength 5 out of 5 in bilateral upper and lower extremities. Musculoskeletal: No warm swelling or erythema around joints, no spinal tenderness noted. Psychiatric: Patient alert and oriented x3, good insight and cognition, good recent to remote recall. Lymph node survey: No cervical axillary or inguinal lymphadenopathy noted.   Data Reviewed: Basic Metabolic Panel: Recent Labs  Lab 12/06/21 1030 12/07/21 1924  NA 141 139  K 4.4 3.9  CL 107 110  CO2 25 22  GLUCOSE 99 126*  BUN 10 11  CREATININE 0.48 0.67  CALCIUM 9.8 10.1   Liver Function Tests: Recent Labs  Lab 12/06/21 1030 12/07/21 1924  AST 21 20  ALT 22 21  ALKPHOS 66 80  BILITOT 0.6 1.0  PROT 7.8 8.9*  ALBUMIN 4.0 4.6   No results for input(s): "LIPASE", "AMYLASE" in the last 168 hours. No results for input(s): "AMMONIA" in the last 168 hours. CBC: Recent Labs  Lab 12/06/21 1030 12/07/21 1924   WBC 8.2 11.4*  NEUTROABS 5.4 8.7*  HGB 13.3 14.3  HCT 39.0 41.5  MCV 71.8* 71.7*  PLT 246 347   Cardiac Enzymes: No results for input(s): "CKTOTAL", "CKMB", "CKMBINDEX", "TROPONINI" in the last 168 hours. BNP (last 3 results) Recent Labs    11/21/21 1403  BNP 17.5    ProBNP (last 3 results) No results for input(s): "PROBNP" in the last 8760 hours.  CBG: No results for input(s): "GLUCAP" in the last 168 hours.  No results found for this or any previous visit (from the past 240 hour(s)).   Studies: DG Chest Portable 1 View  Result Date: 12/07/2021 CLINICAL DATA:  Sickle cell anemia, pain EXAM: PORTABLE CHEST 1 VIEW COMPARISON:  11/21/2021 FINDINGS: Mild bilateral lower lobe atelectasis. No pleural effusion or pneumothorax. The heart is top-normal in size. IMPRESSION: Mild bilateral lower lobe atelectasis. Electronically Signed   By: Charline Bills M.D.   On: 12/07/2021 19:25   ECHOCARDIOGRAM COMPLETE  Result Date: 11/22/2021    ECHOCARDIOGRAM REPORT   Patient Name:   [facility] Date of Exam: 11/22/2021 Medical Rec #:  [id number]     Height:       65.0 in Accession #:    [id number]    Weight:       320.0 lb Date of Birth:  1980/08/10    BSA:          2.415 m Patient Age:    40 years      BP:           134/62 mmHg Patient Gender: F             HR:           82 bpm. Exam Location:  Inpatient Procedure: 2D Echo, Color Doppler and Cardiac Doppler Indications:    Pulmonary Hypertension  History:        Patient has prior history of Echocardiogram examinations, most                 recent 11/15/2019. Arrythmias:Tachycardia.  Sonographer:    Gaynell Face Referring Phys: [id number] ALEXANDER B MELVIN IMPRESSIONS  1. Left ventricular ejection fraction, by estimation, is 60 to 65%. The left ventricle has normal function. The left ventricle has no regional wall motion abnormalities. Left ventricular diastolic parameters are indeterminate.  2. Right ventricular systolic function is normal. The  right ventricular size is normal.  3. The mitral valve is normal in structure. Trivial mitral valve regurgitation.  4. The aortic valve is tricuspid. Aortic valve regurgitation is not visualized. Aortic valve sclerosis/calcification is present, without any evidence of aortic stenosis. FINDINGS  Left Ventricle: Left ventricular ejection fraction, by estimation, is 60 to 65%. The left ventricle has normal function. The left ventricle has no regional wall motion abnormalities. The left ventricular internal cavity size was normal in size. There is  no left ventricular hypertrophy. Left ventricular diastolic parameters are indeterminate. Right  Ventricle: The right ventricular size is normal. No increase in right ventricular wall thickness. Right ventricular systolic function is normal. Left Atrium: Left atrial size was normal in size. Right Atrium: Right atrial size was normal in size. Pericardium: There is no evidence of pericardial effusion. Mitral Valve: The mitral valve is normal in structure. Trivial mitral valve regurgitation. Tricuspid Valve: The tricuspid valve is normal in structure. Tricuspid valve regurgitation is trivial. Aortic Valve: The aortic valve is tricuspid. Aortic valve regurgitation is not visualized. Aortic valve sclerosis/calcification is present, without any evidence of aortic stenosis. Aortic valve mean gradient measures 5.0 mmHg. Aortic valve peak gradient measures 9.6 mmHg. Aortic valve area, by VTI measures 4.32 cm. Pulmonic Valve: The pulmonic valve was not well visualized. Pulmonic valve regurgitation is not visualized. No evidence of pulmonic stenosis. Aorta: The aortic root is normal in size and structure. IAS/Shunts: No atrial level shunt detected by color flow Doppler.  LEFT VENTRICLE PLAX 2D LVIDd:         4.80 cm   Diastology LVIDs:         3.10 cm   LV e' medial:    6.09 cm/s LV PW:         1.10 cm   LV E/e' medial:  17.4 LV IVS:        1.00 cm   LV e' lateral:   5.66 cm/s LVOT  diam:     2.80 cm   LV E/e' lateral: 18.7 LV SV:         106 LV SV Index:   44 LVOT Area:     6.16 cm  RIGHT VENTRICLE TAPSE (M-mode): 2.3 cm LEFT ATRIUM             Index        RIGHT ATRIUM           Index LA diam:        4.00 cm 1.66 cm/m   RA Area:     12.00 cm LA Vol (A2C):   76.7 ml 31.75 ml/m  RA Volume:   24.30 ml  10.06 ml/m LA Vol (A4C):   57.4 ml 23.76 ml/m LA Biplane Vol: 67.7 ml 28.03 ml/m  AORTIC VALVE AV Area (Vmax):    3.96 cm AV Area (Vmean):   3.99 cm AV Area (VTI):     4.32 cm AV Vmax:           155.00 cm/s AV Vmean:          97.700 cm/s AV VTI:            0.245 m AV Peak Grad:      9.6 mmHg AV Mean Grad:      5.0 mmHg LVOT Vmax:         99.70 cm/s LVOT Vmean:        63.300 cm/s LVOT VTI:          0.172 m LVOT/AV VTI ratio: 0.70  AORTA Ao Root diam: 3.50 cm Ao Asc diam:  3.00 cm MITRAL VALVE MV Area (PHT): 4.06 cm     SHUNTS MV Decel Time: 187 msec     Systemic VTI:  0.17 m MV E velocity: 106.00 cm/s  Systemic Diam: 2.80 cm MV A velocity: 108.00 cm/s MV E/A ratio:  0.98 Dietrich Pates MD Electronically signed by Dietrich Pates MD Signature Date/Time: 11/22/2021/4:03:46 PM    Final    DG Chest Portable 1 View  Result Date: 11/21/2021 CLINICAL DATA:  Chest pain  EXAM: PORTABLE CHEST 1 VIEW COMPARISON:  04/02/21 FINDINGS: No pleural effusion. No pneumothorax. Cardiomegaly. No focal airspace opacity. Prominent interstitial opacities, could be seen in the setting of pulmonary venous congestion without overt pulmonary edema. No displaced rib fractures. Visualized upper abdomen is unremarkable. IMPRESSION: 1. No radiographic finding to explain chest pain. 2. Cardiomegaly with pulmonary venous congestion. No overt pulmonary edema. No focal airspace opacity. Electronically Signed   By: Marin Roberts M.D.   On: 11/21/2021 16:13   CT Angio Chest PE W and/or Wo Contrast  Result Date: 11/21/2021 CLINICAL DATA:  Chest pain, pulmonary embolism suspected. EXAM: CT ANGIOGRAPHY CHEST WITH CONTRAST  TECHNIQUE: Multidetector CT imaging of the chest was performed using the standard protocol during bolus administration of intravenous contrast. Multiplanar CT image reconstructions and MIPs were obtained to evaluate the vascular anatomy. RADIATION DOSE REDUCTION: This exam was performed according to the departmental dose-optimization program which includes automated exposure control, adjustment of the mA and/or kV according to patient size and/or use of iterative reconstruction technique. CONTRAST:  170mL OMNIPAQUE IOHEXOL 350 MG/ML SOLN COMPARISON:  CT examination dated April 02, 2021 FINDINGS: Cardiovascular: Satisfactory opacification of the pulmonary arteries to the segmental level. No evidence of pulmonary embolism. Evaluation of distal segmental and subsegmental pulmonary arteries is limited due to phase of contrast enhancement and respiratory motion. Heart is mildly enlarged. Main pulmonary trunk is dilated measuring up to 3.2 cm concerning for pulmonary arterial hypertension. No pericardial effusion. Mediastinum/Nodes: No enlarged mediastinal, hilar, or axillary lymph nodes. Thyroid gland, trachea, and esophagus demonstrate no significant findings. Lungs/Pleura: No focal consolidation or pleural effusion. Low lung volumes. Bibasilar dependent atelectasis. 6 mm nodule in the superior aspect of the right middle lobe (series 6, image 58). Upper Abdomen: No acute abnormality. Musculoskeletal: No chest wall abnormality. No acute or significant osseous findings. Review of the MIP images confirms the above findings. IMPRESSION: 1. No evidence of central pulmonary embolism. Evaluation of distal segmental and subsegmental pulmonary arteries is limited due to phase of contrast enhancement and respiratory motion. 2. Mild cardiomegaly. 3. Main pulmonary trunk is dilated measuring up to 3.2 cm concerning for pulmonary arterial hypertension. 4. Low lung volumes with bibasilar dependent atelectasis. No focal  consolidation or pleural effusion. 5. 6 mm nodule in the superior aspect of the right middle lobe, unchanged. Electronically Signed   By: Keane Police D.O.   On: 11/21/2021 15:44    Scheduled Meds:  enoxaparin (LOVENOX) injection  40 mg Subcutaneous Q24H   HYDROmorphone   Intravenous Q4H   ketorolac  15 mg Intravenous Q6H   senna-docusate  1 tablet Oral BID   Continuous Infusions:  sodium chloride 150 mL/hr at 12/07/21 2130    Active Problems:   Leukocytosis   BMI 50.0-59.9, adult (HCC)   Sickle cell crisis ([place])

## 2021-12-08 NOTE — Assessment & Plan Note (Signed)
Likely reactive to sickle cell crisis. No symptoms or signs of obvious infection.

## 2021-12-08 NOTE — Assessment & Plan Note (Signed)
BMI of 53 

## 2021-12-08 NOTE — Assessment & Plan Note (Signed)
Continues 0.45% IV fluids  IV Dilaudid via PCA with settings of 0.5mg , 10-minute lockout with max of 3mg /hr Toradol 15mg  q6HR Monitor vital signs closely and re-evaluate pain scale

## 2021-12-09 DIAGNOSIS — D5700 Hb-SS disease with crisis, unspecified: Secondary | ICD-10-CM | POA: Diagnosis not present

## 2021-12-09 MED ORDER — OXYCODONE HCL 5 MG PO TABS
20.0000 mg | ORAL_TABLET | ORAL | Status: DC | PRN
  Administered 2021-12-09 – 2021-12-10 (×6): 20 mg via ORAL
  Filled 2021-12-09 (×6): qty 4

## 2021-12-09 MED ORDER — HYDROMORPHONE 1 MG/ML IV SOLN
INTRAVENOUS | Status: DC
  Administered 2021-12-09: 7.5 mg via INTRAVENOUS
  Administered 2021-12-09: 8.5 mg via INTRAVENOUS
  Administered 2021-12-10: 8 mg via INTRAVENOUS
  Administered 2021-12-10: 2.5 mg via INTRAVENOUS
  Administered 2021-12-10: 10.5 mg via INTRAVENOUS
  Administered 2021-12-10: 8.5 mg via INTRAVENOUS
  Filled 2021-12-09 (×2): qty 30

## 2021-12-09 NOTE — Progress Notes (Signed)
Subjective: Stephanie Burch is a 41 year old female with a medical history significant for sickle cell disease, chronic pain syndrome, opiate dependence and tolerance, history of anemia of chronic disease, and history of morbid obesity was admitted for sickle cell pain crisis.  Patient continues to complain of lower extremity pain. She rates pain as 8/10 despite dilaudid PCA.  Patient denies headache, chest pain, nausea, vomiting, or diarrhea.   Objective:  Vital signs in last 24 hours:  Vitals:   12/09/21 1024 12/09/21 1143 12/09/21 1257 12/09/21 1304  BP: 104/67   (!) 144/81  Pulse: 95   96  Resp: 19 (!) 24  16  Temp: 98.3 F (36.8 C)   98.2 F (36.8 C)  TempSrc: Oral   Oral  SpO2: 95% 91% 96% 97%  Weight:      Height:        Intake/Output from previous day:   Intake/Output Summary (Last 24 hours) at 12/09/2021 1410 Last data filed at 12/09/2021 1033 Gross per 24 hour  Intake 995 ml  Output --  Net 995 ml    Physical Exam: General: Alert, awake, oriented x3, in no acute distress.  HEENT: [place]/AT PEERL, EOMI Neck: Trachea midline,  no masses, no thyromegal,y no JVD, no carotid bruit OROPHARYNX:  Moist, No exudate/ erythema/lesions.  Heart: Regular rate and rhythm, without murmurs, rubs, gallops, PMI non-displaced, no heaves or thrills on palpation.  Lungs: Clear to auscultation, no wheezing or rhonchi noted. No increased vocal fremitus resonant to percussion  Abdomen: Soft, nontender, nondistended, positive bowel sounds, no masses no hepatosplenomegaly noted..  Neuro: No focal neurological deficits noted cranial nerves II through XII grossly intact. DTRs 2+ bilaterally upper and lower extremities. Strength 5 out of 5 in bilateral upper and lower extremities. Musculoskeletal: No warm swelling or erythema around joints, no spinal tenderness noted. Psychiatric: Patient alert and oriented x3, good insight and cognition, good recent to remote recall. Lymph node survey: No cervical  axillary or inguinal lymphadenopathy noted.  Lab Results:  Basic Metabolic Panel:    Component Value Date/Time   NA 139 12/07/2021 1924   NA 142 09/11/2017 1431   K 3.9 12/07/2021 1924   CL 110 12/07/2021 1924   CO2 22 12/07/2021 1924   BUN 11 12/07/2021 1924   BUN 8 09/11/2017 1431   CREATININE 0.67 12/07/2021 1924   GLUCOSE 126 (H) 12/07/2021 1924   CALCIUM 10.1 12/07/2021 1924   CBC:    Component Value Date/Time   WBC 15.8 (H) 12/08/2021 0537   HGB 12.3 12/08/2021 0537   HGB 13.0 09/11/2017 1431   HCT 37.0 12/08/2021 0537   HCT 38.4 09/11/2017 1431   PLT 296 12/08/2021 0537   PLT 258 09/11/2017 1431   MCV 72.7 (L) 12/08/2021 0537   MCV 75 (L) 09/11/2017 1431   NEUTROABS 8.7 (H) 12/07/2021 1924   NEUTROABS 3.9 09/11/2017 1431   LYMPHSABS 2.1 12/07/2021 1924   LYMPHSABS 2.2 09/11/2017 1431   MONOABS 0.5 12/07/2021 1924   EOSABS 0.1 12/07/2021 1924   EOSABS 0.1 09/11/2017 1431   BASOSABS 0.0 12/07/2021 1924   BASOSABS 0.0 09/11/2017 1431    No results found for this or any previous visit (from the past 240 hour(s)).  Studies/Results: DG Chest Portable 1 View  Result Date: 12/07/2021 CLINICAL DATA:  Sickle cell anemia, pain EXAM: PORTABLE CHEST 1 VIEW COMPARISON:  11/21/2021 FINDINGS: Mild bilateral lower lobe atelectasis. No pleural effusion or pneumothorax. The heart is top-normal in size. IMPRESSION: Mild bilateral  lower lobe atelectasis. Electronically Signed   By: Julian Hy M.D.   On: 12/07/2021 19:25    Medications: Scheduled Meds:  enoxaparin (LOVENOX) injection  40 mg Subcutaneous [id number]   folic acid  1 mg Oral Daily   HYDROmorphone   Intravenous Q4H   ketorolac  15 mg Intravenous Q6H   senna-docusate  1 tablet Oral BID   voxelotor  1,500 mg Oral Q1500   Continuous Infusions:  sodium chloride 75 mL/hr at 12/09/21 0152   PRN Meds:.diphenhydrAMINE, naloxone **AND** sodium chloride flush, ondansetron, oxyCODONE, polyethylene  glycol  Consultants: none  Procedures: none  Antibiotics: none  Assessment/Plan: Active Problems:   Leukocytosis   BMI 50.0-59.9, adult (HCC)   Sickle cell crisis (HCC)  Sickle cell plain crisis:  Weaning IV dilaudid PCA Oxycodone 20 mg every 4 hours as needed for severe breakthrough pain Toradol 15 mg IV x1 Monitor vital signs very closely, reevaluate pain scale regularly, and supplemental oxygen as needed Patient encouraged to ambulate in halls in preparation for discharge on 12/10/2021  Leukocytosis: Stable.  Patient afebrile.  Continue to monitor closely.  Morbid obesity: Dietary counseling provided on admission.   Code Status: Full Code Family Communication: N/A Disposition Plan: Not yet ready for discharge  [place], MSN, FNP-C Patient [place] [address] [place], [place] [address] [phone]  If 7PM-7AM, please contact night-coverage.  12/09/2021, 2:10 PM  LOS: 1 day

## 2021-12-09 NOTE — H&P (Signed)
[facility] History and Physical   Date: 12/09/2021  Patient name: Stephanie Burch Medical record number: [id number] Date of birth: 1980/02/24 Age: 41 y.o. Gender: female PCP: Pcp, No  Attending physician: No att. providers found  Chief Complaint: Sickle cell pain   History of Present Illness: Stephanie Burch is a 41 year old female with a medical history significant for sickle.cell disease, chronic pain syndrome, opiate dependence and tolerance, morbid obesity, and history of anemia of chronic disease presents with complaints of lower extremity pain. Pain is consistent with typical sickle cell crisis. Pain has not been relieved by home medication regimen. Patient attributes pain crisis to changes in weather. Patient rates pain as 9/10, constant, and throbbing.  She last had oxycodone this a.m. without sustained relief.  She denies headache, fever, chills, or chest pain.  No shortness of breath, dizziness, nausea, vomiting, or diarrhea.  No recent travel, sick contacts, or known exposure to COVID-19.  Meds: Medications Prior to Admission  Medication Sig Dispense Refill Last Dose   acetaminophen (TYLENOL) 500 MG tablet Take 1,000 mg by mouth every 6 (six) hours as needed for mild pain, moderate pain or headache.   12/06/2021   folic acid (FOLVITE) 1 MG tablet Take 1 mg by mouth daily.   12/05/2021   HYDROmorphone (DILAUDID) 4 MG tablet Take 4 mg by mouth every 6 (six) hours as needed for severe pain.   Past Week   ibuprofen (ADVIL) 200 MG tablet Take 800 mg by mouth 3 (three) times daily as needed for fever, headache or mild pain.   12/05/2021   OXBRYTA 500 MG TABS tablet Take 1,500 mg by mouth daily in the afternoon.   12/05/2021   oxycodone (ROXICODONE) 30 MG immediate release tablet Take 30 mg by mouth every 4 (four) hours as needed for pain.   12/06/2021    Allergies: Patient has no known allergies. Past Medical History:  Diagnosis Date   Acute kidney injury (HCC) 11/07/2019    Anemia    Chronic pain    COVID-19 virus detected 10/22/2019   Depression    HCAP (healthcare-associated pneumonia) 11/07/2019   History of blood transfusion 2013   x 2   HSV infection    Hypoxia    Pneumonia    x 1   Pulmonary edema 11/11/2019   Sepsis (HCC) 11/07/2019   Sickle cell anemia (HCC) 09/2021   gets them frequently   Tachycardia with heart rate 100-120 beats per minute    Vitamin B12 deficiency 12/2018   Vitamin D deficiency    Past Surgical History:  Procedure Laterality Date   CESAREAN SECTION  2013   x 1   TOOTH EXTRACTION N/A 10/18/2021   Procedure: DENTAL RESTORATION/EXTRACTIONS;  Surgeon: Ocie Doyne, DMD;  Location: [facility] OR;  Service: Oral Surgery;  Laterality: N/A;   Family History  Problem Relation Age of Onset   Hypertension Mother    Sickle cell trait Mother    Diabetes Father    Sickle cell trait Father    Social History   Socioeconomic History   Marital status: Single    Spouse name: Not on file   Number of children: Not on file   Years of education: Not on file   Highest education level: Not on file  Occupational History   Not on file  Tobacco Use   Smoking status: Never   Smokeless tobacco: Never  Vaping Use   Vaping Use: Never used  Substance and Sexual Activity  Alcohol use: Not Currently   Drug use: Never   Sexual activity: Yes    Birth control/protection: None  Other Topics Concern   Not on file  Social History Narrative   Not on file   Social Determinants of Health   Financial Resource Strain: Not on file  Food Insecurity: No Food Insecurity (12/08/2021)   Hunger Vital Sign    Worried About Running Out of Food in the Last Year: Never true    Ran Out of Food in the Last Year: Never true  Transportation Needs: No Transportation Needs (12/08/2021)   PRAPARE - Hydrologist (Medical): No    Lack of Transportation (Non-Medical): No  Physical Activity: Not on file  Stress: Not on file  Social  Connections: Not on file  Intimate Partner Violence: Not At Risk (12/08/2021)   Humiliation, Afraid, Rape, and Kick questionnaire    Fear of Current or Ex-Partner: No    Emotionally Abused: No    Physically Abused: No    Sexually Abused: No   Review of Systems  Constitutional: Negative.   HENT: Negative.    Eyes: Negative.   Respiratory: Negative.    Cardiovascular: Negative.   Gastrointestinal: Negative.   Genitourinary: Negative.   Musculoskeletal:  Positive for back pain and joint pain.  Skin: Negative.   Neurological: Negative.   Psychiatric/Behavioral: Negative.      Physical Exam: Blood pressure 115/61, pulse 82, temperature (!) 97.5 F (36.4 C), temperature source Temporal, resp. rate 16, last menstrual period 10/31/2021, SpO2 92 %. Physical Exam Constitutional:      Appearance: She is obese.  Eyes:     Pupils: Pupils are equal, round, and reactive to light.  Cardiovascular:     Rate and Rhythm: Normal rate and regular rhythm.     Pulses: Normal pulses.  Pulmonary:     Effort: Pulmonary effort is normal.  Abdominal:     General: Abdomen is flat. Bowel sounds are normal.  Musculoskeletal:        General: Normal range of motion.  Skin:    General: Skin is warm.  Neurological:     General: No focal deficit present.     Mental Status: She is alert. Mental status is at baseline.  Psychiatric:        Mood and Affect: Mood normal.        Behavior: Behavior normal.        Thought Content: Thought content normal.        Judgment: Judgment normal.      Lab results: No results found for this or any previous visit (from the past 24 hour(s)).  Imaging results:  DG Chest Portable 1 View  Result Date: 12/07/2021 CLINICAL DATA:  Sickle cell anemia, pain EXAM: PORTABLE CHEST 1 VIEW COMPARISON:  11/21/2021 FINDINGS: Mild bilateral lower lobe atelectasis. No pleural effusion or pneumothorax. The heart is top-normal in size. IMPRESSION: Mild bilateral lower lobe  atelectasis. Electronically Signed   By: Julian Hy M.D.   On: 12/07/2021 19:25     Assessment & Plan: Patient admitted to sickle cell day infusion center for management of pain crisis.  Patient is opiate tolerant Initiate IV dilaudid PCA.  IV fluids, 0.45% 100 ml/hr Toradol 15 mg IV times one dose Tylenol 1000 mg by mouth times one dose Review CBC with differential, complete metabolic panel, and reticulocytes as results become available. Pain intensity will be reevaluated in context of functioning and relationship to baseline as care  progresses If pain intensity remains elevated and/or sudden change in hemodynamic stability transition to inpatient services for higher level of care.    [name]  APRN, MSN, FNP-C Patient [facility] [address] [place], [place] [address] [phone]  12/09/2021, 6:58 PM

## 2021-12-10 NOTE — Plan of Care (Signed)
Problem: Education: Goal: Knowledge of vaso-occlusive preventative measures will improve 12/10/2021 1343 by Hewitt Blade, Yacine Garriga D, RN Outcome: Adequate for Discharge 12/10/2021 1042 by Hewitt Blade, Parnika Tweten D, RN Outcome: Progressing Goal: Awareness of infection prevention will improve 12/10/2021 1343 by Hewitt Blade, Jaelene Garciagarcia D, RN Outcome: Adequate for Discharge 12/10/2021 1042 by Hewitt Blade, Michille Mcelrath D, RN Outcome: Progressing Goal: Awareness of signs and symptoms of anemia will improve 12/10/2021 1343 by Hewitt Blade, Roxie Kreeger D, RN Outcome: Adequate for Discharge 12/10/2021 1042 by Hewitt Blade, Zyasia Halbleib D, RN Outcome: Progressing Goal: Long-term complications will improve 12/10/2021 1343 by Hewitt Blade, Raylynn Hersh D, RN Outcome: Adequate for Discharge 12/10/2021 1042 by Hewitt Blade, Kajuana Shareef D, RN Outcome: Progressing   Problem: Self-Care: Goal: Ability to incorporate actions that prevent/reduce pain crisis will improve 12/10/2021 1343 by Hewitt Blade, Izsak Meir D, RN Outcome: Adequate for Discharge 12/10/2021 1042 by Hewitt Blade, Shaunte Tuft D, RN Outcome: Progressing   Problem: Bowel/Gastric: Goal: Gut motility will be maintained 12/10/2021 1343 by Hewitt Blade, Cerenity Goshorn D, RN Outcome: Adequate for Discharge 12/10/2021 1042 by Hewitt Blade, Jossalyn Forgione D, RN Outcome: Progressing   Problem: Tissue Perfusion: Goal: Complications related to inadequate tissue perfusion will be avoided or minimized 12/10/2021 1343 by Hewitt Blade, Delphia Kaylor D, RN Outcome: Adequate for Discharge 12/10/2021 1042 by Hewitt Blade, Gillian Meeuwsen D, RN Outcome: Progressing   Problem: Respiratory: Goal: Pulmonary complications will be avoided or minimized 12/10/2021 1343 by Hewitt Blade, Marji Kuehnel D, RN Outcome: Adequate for Discharge 12/10/2021 1042 by Hewitt Blade, Sharronda Schweers D, RN Outcome: Progressing Goal: Acute Chest Syndrome will be identified early to prevent  complications [id number] by Hewitt Blade, Lataja Newland D, RN Outcome: Adequate for Discharge 12/10/2021 1042 by Hewitt Blade, Amparo Donalson D, RN Outcome: Progressing   Problem: Fluid Volume: Goal: Ability to maintain a balanced intake and output will improve 12/10/2021 1343 by Hewitt Blade, Adyen Bifulco D, RN Outcome: Adequate for Discharge 12/10/2021 1042 by Hewitt Blade, Azrielle Springsteen D, RN Outcome: Progressing   Problem: Sensory: Goal: Pain level will decrease with appropriate interventions 12/10/2021 1343 by Hewitt Blade, Delainee Tramel D, RN Outcome: Adequate for Discharge 12/10/2021 1042 by Hewitt Blade, Genesi Stefanko D, RN Outcome: Progressing   Problem: Health Behavior: Goal: Postive changes in compliance with treatment and prescription regimens will improve 12/10/2021 1343 by Hewitt Blade, Taisia Fantini D, RN Outcome: Adequate for Discharge 12/10/2021 1042 by Hewitt Blade, Charman Blasco D, RN Outcome: Progressing   Problem: Education: Goal: Knowledge of General Education information will improve Description: Including pain rating scale, medication(s)/side effects and non-pharmacologic comfort measures 12/10/2021 1343 by Hewitt Blade, Lasharon Dunivan D, RN Outcome: Adequate for Discharge 12/10/2021 1042 by Hewitt Blade, Greydis Stlouis D, RN Outcome: Progressing   Problem: Health Behavior/Discharge Planning: Goal: Ability to manage health-related needs will improve 12/10/2021 1343 by Hewitt Blade, Zenda Herskowitz D, RN Outcome: Adequate for Discharge 12/10/2021 1042 by Hewitt Blade, Bertran Zeimet D, RN Outcome: Progressing   Problem: Clinical Measurements: Goal: Ability to maintain clinical measurements within normal limits will improve 12/10/2021 1343 by Hewitt Blade, Amarise Lillo D, RN Outcome: Adequate for Discharge 12/10/2021 1042 by Hewitt Blade, Kidada Ging D, RN Outcome: Progressing Goal: Will remain free from infection 12/10/2021 1343 by Hewitt Blade, Tija Biss D, RN Outcome: Adequate for  Discharge 12/10/2021 1042 by Hewitt Blade, Yaritzel Stange D, RN Outcome: Progressing Goal: Diagnostic test results will improve 12/10/2021 1343 by Hewitt Blade, Candace Ramus D, RN Outcome: Adequate for Discharge 12/10/2021 1042 by Hewitt Blade, Eliberto Sole D, RN Outcome: Progressing Goal: Respiratory complications will improve 12/10/2021 1343 by Hewitt Blade, Hallie Ertl D, RN Outcome: Adequate for Discharge  12/10/2021 1042 by Hewitt Blade, Edmundo Tedesco D, RN Outcome: Progressing Goal: Cardiovascular complication will be avoided 12/10/2021 1343 by Hewitt Blade, Julaine Zimny D, RN Outcome: Adequate for Discharge 12/10/2021 1042 by Hewitt Blade, Shatavia Santor D, RN Outcome: Progressing   Problem: Activity: Goal: Risk for activity intolerance will decrease 12/10/2021 1343 by Hewitt Blade, Charlie Char D, RN Outcome: Adequate for Discharge 12/10/2021 1042 by Hewitt Blade, Scarlet Abad D, RN Outcome: Progressing   Problem: Nutrition: Goal: Adequate nutrition will be maintained 12/10/2021 1343 by Hewitt Blade, Peder Allums D, RN Outcome: Adequate for Discharge 12/10/2021 1042 by Hewitt Blade, Jaki Hammerschmidt D, RN Outcome: Progressing   Problem: Coping: Goal: Level of anxiety will decrease 12/10/2021 1343 by Hewitt Blade, Tanishka Drolet D, RN Outcome: Adequate for Discharge 12/10/2021 1042 by Hewitt Blade, Doctor Sheahan D, RN Outcome: Progressing   Problem: Elimination: Goal: Will not experience complications related to bowel motility 12/10/2021 1343 by Hewitt Blade, Lennyx Verdell D, RN Outcome: Adequate for Discharge 12/10/2021 1042 by Hewitt Blade, Casmira Cramer D, RN Outcome: Progressing Goal: Will not experience complications related to urinary retention 12/10/2021 1343 by Hewitt Blade, Rosezella Kronick D, RN Outcome: Adequate for Discharge 12/10/2021 1042 by Hewitt Blade, Kamuela Magos D, RN Outcome: Progressing   Problem: Pain Managment: Goal: General experience of comfort will improve 12/10/2021 1343 by Hewitt Blade,  Mathan Darroch D, RN Outcome: Adequate for Discharge 12/10/2021 1042 by Hewitt Blade, Moustapha Tooker D, RN Outcome: Progressing   Problem: Safety: Goal: Ability to remain free from injury will improve 12/10/2021 1343 by Hewitt Blade, Brooklynne Pereida D, RN Outcome: Adequate for Discharge 12/10/2021 1042 by Hewitt Blade, Metha Kolasa D, RN Outcome: Progressing   Problem: Skin Integrity: Goal: Risk for impaired skin integrity will decrease 12/10/2021 1343 by Hewitt Blade, Sherryn Pollino D, RN Outcome: Adequate for Discharge 12/10/2021 1042 by Hewitt Blade, Shefali Ng D, RN Outcome: Progressing

## 2021-12-10 NOTE — Plan of Care (Signed)
Pt rating pain 5/10 this am.  PCA use encouraged.  No additional complaints at this time.  Problem: Education: Goal: Knowledge of vaso-occlusive preventative measures will improve Outcome: Progressing Goal: Awareness of infection prevention will improve Outcome: Progressing Goal: Awareness of signs and symptoms of anemia will improve Outcome: Progressing Goal: Long-term complications will improve Outcome: Progressing   Problem: Self-Care: Goal: Ability to incorporate actions that prevent/reduce pain crisis will improve Outcome: Progressing   Problem: Bowel/Gastric: Goal: Gut motility will be maintained Outcome: Progressing   Problem: Tissue Perfusion: Goal: Complications related to inadequate tissue perfusion will be avoided or minimized Outcome: Progressing   Problem: Respiratory: Goal: Pulmonary complications will be avoided or minimized Outcome: Progressing Goal: Acute Chest Syndrome will be identified early to prevent complications Outcome: Progressing   Problem: Fluid Volume: Goal: Ability to maintain a balanced intake and output will improve Outcome: Progressing   Problem: Sensory: Goal: Pain level will decrease with appropriate interventions Outcome: Progressing   Problem: Health Behavior: Goal: Postive changes in compliance with treatment and prescription regimens will improve Outcome: Progressing   Problem: Education: Goal: Knowledge of General Education information will improve Description: Including pain rating scale, medication(s)/side effects and non-pharmacologic comfort measures Outcome: Progressing   Problem: Health Behavior/Discharge Planning: Goal: Ability to manage health-related needs will improve Outcome: Progressing   Problem: Clinical Measurements: Goal: Ability to maintain clinical measurements within normal limits will improve Outcome: Progressing Goal: Will remain free from infection Outcome: Progressing Goal: Diagnostic test results  will improve Outcome: Progressing Goal: Respiratory complications will improve Outcome: Progressing Goal: Cardiovascular complication will be avoided Outcome: Progressing   Problem: Activity: Goal: Risk for activity intolerance will decrease Outcome: Progressing   Problem: Nutrition: Goal: Adequate nutrition will be maintained Outcome: Progressing   Problem: Coping: Goal: Level of anxiety will decrease Outcome: Progressing   Problem: Elimination: Goal: Will not experience complications related to bowel motility Outcome: Progressing Goal: Will not experience complications related to urinary retention Outcome: Progressing   Problem: Pain Managment: Goal: General experience of comfort will improve Outcome: Progressing   Problem: Safety: Goal: Ability to remain free from injury will improve Outcome: Progressing   Problem: Skin Integrity: Goal: Risk for impaired skin integrity will decrease Outcome: Progressing

## 2021-12-10 NOTE — TOC Progression Note (Signed)
Transition of Care [facility]) - Progression Note    Patient Details  Name: Stephanie Burch MRN: [id number] Date of Birth: Apr 08, 1980  Transition of Care [facility]) CM/SW [place], RN Phone Number:[phone]  12/10/2021, 2:35 PM  Clinical Narrative:            Expected Discharge Plan and Services   Transition of Care [facility]) Screening Note   Patient Details  Name: Stephanie Burch Date of Birth: Feb 18, 1980   Transition of Care [facility]) CM/SW Contact:    Angelita Ingles, RN Phone Number: 12/10/2021, 2:35 PM    Transition of Care Department [facility]) has reviewed patient and no TOC needs have been identified at this time. We will continue to monitor patient advancement through interdisciplinary progression rounds. If new patient transition needs arise, please place a TOC consult.           Expected Discharge Date: 12/10/21                                     Social Determinants of Health (SDOH) Interventions    Readmission Risk Interventions    12/19/2020   12:15 PM 08/14/2020   10:14 AM 07/17/2020   11:13 AM  Readmission Risk Prevention Plan  Transportation Screening Complete Complete Complete  PCP or Specialist Appt within 3-5 Days  Complete   HRI or [place]  Complete Complete  Social Work Consult for [place] Planning/Counseling  Complete Complete  Palliative Care Screening  Not Applicable   Medication Review [employer]) Complete Complete Complete  HRI or Home Care Consult Complete    SW Recovery Care/Counseling Consult Complete    Palliative Care Screening Not [place] Not Applicable

## 2021-12-11 LAB — HGB FRAC BY HPLC+SOLUBILITY
Hgb A2: 3.1 % (ref 1.8–3.2)
Hgb A: 0 % — ABNORMAL LOW (ref 96.4–98.8)
Hgb C: 49.4 % — ABNORMAL HIGH
Hgb E: 0 %
Hgb F: 1.7 % (ref 0.0–2.0)
Hgb S: 45.8 % — ABNORMAL HIGH
Hgb Solubility: POSITIVE — AB
Hgb Variant: 0 %

## 2021-12-11 LAB — HGB FRACTIONATION CASCADE

## 2021-12-11 NOTE — Discharge Summary (Cosign Needed Addendum)
Physician Discharge Summary  Stephanie Burch LDJ:[id number] DOB: 08-24-1980 DOA: 12/07/2021  PCP: Pcp, No  Admit date: 12/07/2021  Discharge date: 12/10/2021  Discharge Diagnoses:  Active Problems:   Leukocytosis   BMI 50.0-59.9, adult (HCC)   Sickle cell crisis (HCC)   Discharge Condition: Stable  Disposition:   Follow-up Information     Quentin Angst, MD Follow up.   Specialty: Internal Medicine Contact information: [address] [place] [address] [phone]                Pt is discharged home in good condition and is to follow up with [facility] this week to have labs evaluated. Stephanie Burch is instructed to increase activity slowly and balance with rest for the next few days, and use prescribed medication to complete treatment of pain  Diet: Regular Wt Readings from Last 3 Encounters:  12/08/21 (!) 146.7 kg  11/25/21 (!) 151.4 kg  11/07/21 (!) 145.2 kg    History of present illness:  Stephanie Burch is a 41 year old female with a medical history significant for sickle cell disease, chronic pain syndrome, morbid obesity, opiate dependence and tolerance presents with sickle cell pain crisis.   Presents with pain to her lower extremities, which are her usual sites for sickle cell pain crisis.  Denies any chest pain, shortness of breath.  Patient was just seen yesterday at the sickle cell clinic, but had return of pain not controlled by home medication.  In the ED, she was afebrile, heart rate of 104, RR 22 with blood pressure of 140/108.  WBCs 11.4, hemoglobin 14.3. Sodium 139, potassium 3.9, creatinine of 0.67, and CBG of 126. Chest x-ray shows mild bilateral lower lobe atelectasis. Patient was given up to 6 mg doses of IV Dilaudid and had continual pain so hospitalist was consulted for further management.   Hospital Course:  Sickle cell pain:  Patient was admitted for sickle cell pain crisis and managed appropriately with IVF,  IV Dilaudid via PCA and IV Toradol, as well as other adjunct therapies per sickle cell pain management protocols.  IV Dilaudid PCA weaned appropriately and patient was transition to her home medications.  Opiate medications are managed by patient's hematology team at [facility].  She was advised to follow-up as scheduled. Patient's pain intensity is 6/10.  She states that she can manage at home. Patient is alert, oriented, and ambulating without assistance.  She will discharge home in hemodynamically stable condition.  Patient was therefore discharged home today in a hemodynamically stable condition.   Stephanie Burch will follow-up with PCP within 1 week of this discharge. Stephanie Burch was counseled extensively about nonpharmacologic means of pain management, patient verbalized understanding and was appreciative of  the care received during this admission.   We discussed the need for good hydration, monitoring of hydration status, avoidance of heat, cold, stress, and infection triggers. We discussed the need to be adherent with taking Oxbryta and other home medications. Patient was reminded of the need to seek medical attention immediately if any symptom of bleeding, anemia, or infection occurs.  Discharge Exam: Vitals:   12/10/21 0823 12/10/21 1012  BP:    Pulse:    Resp: 17 14  Temp:    SpO2: 96% 96%   Vitals:   12/10/21 0354 12/10/21 0630 12/10/21 0823 12/10/21 1012  BP:  115/62    Pulse:  89    Resp: 17 15 17 14   Temp:  97.6 F (36.4  C)    TempSrc:  Oral    SpO2: 94% 94% 96% 96%  Weight:      Height:        General appearance : Awake, alert, not in any distress. Speech Clear. Not toxic looking HEENT: Atraumatic and Normocephalic, pupils equally reactive to light and accomodation Neck: Supple, no JVD. No cervical lymphadenopathy.  Chest: Good air entry bilaterally, no added sounds  CVS: S1 S2 regular, no murmurs.  Abdomen: Bowel sounds present, Non tender and not distended with no  gaurding, rigidity or rebound. Extremities: B/L Lower Ext shows no edema, both legs are warm to touch Neurology: Awake alert, and oriented X 3, CN II-XII intact, Non focal Skin: No Rash  Discharge Instructions  Discharge Instructions     Discharge patient   Complete by: As directed    Discharge disposition: 01-Home or Self Care   Discharge patient date: 12/10/2021      Allergies as of 12/10/2021   No Known Allergies      Medication List     TAKE these medications    acetaminophen 500 MG tablet Commonly known as: TYLENOL Take 1,000 mg by mouth every 6 (six) hours as needed for mild pain, moderate pain or headache.   folic acid 1 MG tablet Commonly known as: FOLVITE Take 1 mg by mouth daily.   HYDROmorphone 4 MG tablet Commonly known as: DILAUDID Take 4 mg by mouth every 6 (six) hours as needed for severe pain.   ibuprofen 200 MG tablet Commonly known as: ADVIL Take 800 mg by mouth 3 (three) times daily as needed for fever, headache or mild pain.   Oxbryta 500 MG Tabs tablet Generic drug: voxelotor Take 1,500 mg by mouth daily in the afternoon.   oxycodone 30 MG immediate release tablet Commonly known as: ROXICODONE Take 30 mg by mouth every 4 (four) hours as needed for pain.        The results of significant diagnostics from this hospitalization (including imaging, microbiology, ancillary and laboratory) are listed below for reference.    Significant Diagnostic Studies: DG Chest Portable 1 View  Result Date: 12/07/2021 CLINICAL DATA:  Sickle cell anemia, pain EXAM: PORTABLE CHEST 1 VIEW COMPARISON:  11/21/2021 FINDINGS: Mild bilateral lower lobe atelectasis. No pleural effusion or pneumothorax. The heart is top-normal in size. IMPRESSION: Mild bilateral lower lobe atelectasis. Electronically Signed   By: Charline Bills M.D.   On: 12/07/2021 19:25   ECHOCARDIOGRAM COMPLETE  Result Date: 11/22/2021    ECHOCARDIOGRAM REPORT   Patient Name:   [facility] [place]  Date of Exam: 11/22/2021 Medical Rec #:  [id number]     Height:       65.0 in Accession #:    [id number]    Weight:       320.0 lb Date of Birth:  05-Jun-1980    BSA:          2.415 m Patient Age:    40 years      BP:           134/62 mmHg Patient Gender: F             HR:           82 bpm. Exam Location:  Inpatient Procedure: 2D Echo, Color Doppler and Cardiac Doppler Indications:    Pulmonary Hypertension  History:        Patient has prior history of Echocardiogram examinations, most  recent 11/15/2019. Arrythmias:Tachycardia.  Sonographer:    Gaynell Face Referring Phys: [id number] ALEXANDER B MELVIN IMPRESSIONS  1. Left ventricular ejection fraction, by estimation, is 60 to 65%. The left ventricle has normal function. The left ventricle has no regional wall motion abnormalities. Left ventricular diastolic parameters are indeterminate.  2. Right ventricular systolic function is normal. The right ventricular size is normal.  3. The mitral valve is normal in structure. Trivial mitral valve regurgitation.  4. The aortic valve is tricuspid. Aortic valve regurgitation is not visualized. Aortic valve sclerosis/calcification is present, without any evidence of aortic stenosis. FINDINGS  Left Ventricle: Left ventricular ejection fraction, by estimation, is 60 to 65%. The left ventricle has normal function. The left ventricle has no regional wall motion abnormalities. The left ventricular internal cavity size was normal in size. There is  no left ventricular hypertrophy. Left ventricular diastolic parameters are indeterminate. Right Ventricle: The right ventricular size is normal. No increase in right ventricular wall thickness. Right ventricular systolic function is normal. Left Atrium: Left atrial size was normal in size. Right Atrium: Right atrial size was normal in size. Pericardium: There is no evidence of pericardial effusion. Mitral Valve: The mitral valve is normal in structure. Trivial mitral valve  regurgitation. Tricuspid Valve: The tricuspid valve is normal in structure. Tricuspid valve regurgitation is trivial. Aortic Valve: The aortic valve is tricuspid. Aortic valve regurgitation is not visualized. Aortic valve sclerosis/calcification is present, without any evidence of aortic stenosis. Aortic valve mean gradient measures 5.0 mmHg. Aortic valve peak gradient measures 9.6 mmHg. Aortic valve area, by VTI measures 4.32 cm. Pulmonic Valve: The pulmonic valve was not well visualized. Pulmonic valve regurgitation is not visualized. No evidence of pulmonic stenosis. Aorta: The aortic root is normal in size and structure. IAS/Shunts: No atrial level shunt detected by color flow Doppler.  LEFT VENTRICLE PLAX 2D LVIDd:         4.80 cm   Diastology LVIDs:         3.10 cm   LV e' medial:    6.09 cm/s LV PW:         1.10 cm   LV E/e' medial:  17.4 LV IVS:        1.00 cm   LV e' lateral:   5.66 cm/s LVOT diam:     2.80 cm   LV E/e' lateral: 18.7 LV SV:         106 LV SV Index:   44 LVOT Area:     6.16 cm  RIGHT VENTRICLE TAPSE (M-mode): 2.3 cm LEFT ATRIUM             Index        RIGHT ATRIUM           Index LA diam:        4.00 cm 1.66 cm/m   RA Area:     12.00 cm LA Vol (A2C):   76.7 ml 31.75 ml/m  RA Volume:   24.30 ml  10.06 ml/m LA Vol (A4C):   57.4 ml 23.76 ml/m LA Biplane Vol: 67.7 ml 28.03 ml/m  AORTIC VALVE AV Area (Vmax):    3.96 cm AV Area (Vmean):   3.99 cm AV Area (VTI):     4.32 cm AV Vmax:           155.00 cm/s AV Vmean:          97.700 cm/s AV VTI:            0.245  m AV Peak Grad:      9.6 mmHg AV Mean Grad:      5.0 mmHg LVOT Vmax:         99.70 cm/s LVOT Vmean:        63.300 cm/s LVOT VTI:          0.172 m LVOT/AV VTI ratio: 0.70  AORTA Ao Root diam: 3.50 cm Ao Asc diam:  3.00 cm MITRAL VALVE MV Area (PHT): 4.06 cm     SHUNTS MV Decel Time: 187 msec     Systemic VTI:  0.17 m MV E velocity: 106.00 cm/s  Systemic Diam: 2.80 cm MV A velocity: 108.00 cm/s MV E/A ratio:  0.98 Dietrich Pates MD  Electronically signed by Dietrich Pates MD Signature Date/Time: 11/22/2021/4:03:46 PM    Final    DG Chest Portable 1 View  Result Date: 11/21/2021 CLINICAL DATA:  Chest pain EXAM: PORTABLE CHEST 1 VIEW COMPARISON:  04/02/21 FINDINGS: No pleural effusion. No pneumothorax. Cardiomegaly. No focal airspace opacity. Prominent interstitial opacities, could be seen in the setting of pulmonary venous congestion without overt pulmonary edema. No displaced rib fractures. Visualized upper abdomen is unremarkable. IMPRESSION: 1. No radiographic finding to explain chest pain. 2. Cardiomegaly with pulmonary venous congestion. No overt pulmonary edema. No focal airspace opacity. Electronically Signed   By: Lorenza Cambridge M.D.   On: 11/21/2021 16:13   CT Angio Chest PE W and/or Wo Contrast  Result Date: 11/21/2021 CLINICAL DATA:  Chest pain, pulmonary embolism suspected. EXAM: CT ANGIOGRAPHY CHEST WITH CONTRAST TECHNIQUE: Multidetector CT imaging of the chest was performed using the standard protocol during bolus administration of intravenous contrast. Multiplanar CT image reconstructions and MIPs were obtained to evaluate the vascular anatomy. RADIATION DOSE REDUCTION: This exam was performed according to the departmental dose-optimization program which includes automated exposure control, adjustment of the mA and/or kV according to patient size and/or use of iterative reconstruction technique. CONTRAST:  OMNIPAQUE IOHEXOL 350 MG/ML SOLN COMPARISON:  CT examination dated April 02, 2021 FINDINGS: Cardiovascular: Satisfactory opacification of the pulmonary arteries to the segmental level. No evidence of pulmonary embolism. Evaluation of distal segmental and subsegmental pulmonary arteries is limited due to phase of contrast enhancement and respiratory motion. Heart is mildly enlarged. Main pulmonary trunk is dilated measuring up to 3.2 cm concerning for pulmonary arterial hypertension. No pericardial effusion.  Mediastinum/Nodes: No enlarged mediastinal, hilar, or axillary lymph nodes. Thyroid gland, trachea, and esophagus demonstrate no significant findings. Lungs/Pleura: No focal consolidation or pleural effusion. Low lung volumes. Bibasilar dependent atelectasis. 6 mm nodule in the superior aspect of the right middle lobe (series 6, image 58). Upper Abdomen: No acute abnormality. Musculoskeletal: No chest wall abnormality. No acute or significant osseous findings. Review of the MIP images confirms the above findings. IMPRESSION: 1. No evidence of central pulmonary embolism. Evaluation of distal segmental and subsegmental pulmonary arteries is limited due to phase of contrast enhancement and respiratory motion. 2. Mild cardiomegaly. 3. Main pulmonary trunk is dilated measuring up to 3.2 cm concerning for pulmonary arterial hypertension. 4. Low lung volumes with bibasilar dependent atelectasis. No focal consolidation or pleural effusion. 5. 6 mm nodule in the superior aspect of the right middle lobe, unchanged. Electronically Signed   By: Larose Hires D.O.   On: 11/21/2021 15:44    Microbiology: No results found for this or any previous visit (from the past 240 hour(s)).   Labs: Basic Metabolic Panel: Recent Labs  Lab 12/06/21 1030 12/07/21 1924  NA  141 139  K 4.4 3.9  CL 107 110  CO2 25 22  GLUCOSE 99 126*  BUN 10 11  CREATININE 0.48 0.67  CALCIUM 9.8 10.1   Liver Function Tests: Recent Labs  Lab 12/06/21 1030 12/07/21 1924  AST 21 20  ALT 22 21  ALKPHOS 66 80  BILITOT 0.6 1.0  PROT 7.8 8.9*  ALBUMIN 4.0 4.6   No results for input(s): "LIPASE", "AMYLASE" in the last 168 hours. No results for input(s): "AMMONIA" in the last 168 hours. CBC: Recent Labs  Lab 12/06/21 1030 12/07/21 1924 12/08/21 0537  WBC 8.2 11.4* 15.8*  NEUTROABS 5.4 8.7*  --   HGB 13.3 14.3 12.3  HCT 39.0 41.5 37.0  MCV 71.8* 71.7* 72.7*  PLT 246 347 296   Cardiac Enzymes: No results for input(s): "CKTOTAL",  "CKMB", "CKMBINDEX", "TROPONINI" in the last 168 hours. BNP: Invalid input(s): "POCBNP" CBG: No results for input(s): "GLUCAP" in the last 168 hours.  Time coordinating discharge: 30 minutes  Signed: Nolon NationsLachina Moore Yasir Kitner  APRN, MSN, FNP-C Patient Care [facility] [address]  [place], [place] [id number] [phone]  [facility] 12/11/2021, 6:41 PM

## 2021-12-18 ENCOUNTER — Encounter (HOSPITAL_COMMUNITY): Payer: Self-pay | Admitting: Family Medicine

## 2021-12-18 ENCOUNTER — Telehealth (HOSPITAL_COMMUNITY): Payer: Self-pay

## 2021-12-18 ENCOUNTER — Non-Acute Institutional Stay (HOSPITAL_COMMUNITY)
Admission: AD | Admit: 2021-12-18 | Discharge: 2021-12-18 | Disposition: A | Discharge status: Home / Self Care | Payer: Medicare Other | Source: Ambulatory Visit | Attending: Internal Medicine | Admitting: Internal Medicine

## 2021-12-18 DIAGNOSIS — M79602 Pain in left arm: Secondary | ICD-10-CM | POA: Insufficient documentation

## 2021-12-18 DIAGNOSIS — F1120 Opioid dependence, uncomplicated: Secondary | ICD-10-CM | POA: Diagnosis not present

## 2021-12-18 DIAGNOSIS — M79605 Pain in left leg: Secondary | ICD-10-CM | POA: Insufficient documentation

## 2021-12-18 DIAGNOSIS — D5700 Hb-SS disease with crisis, unspecified: Secondary | ICD-10-CM | POA: Diagnosis present

## 2021-12-18 DIAGNOSIS — G894 Chronic pain syndrome: Secondary | ICD-10-CM | POA: Diagnosis not present

## 2021-12-18 DIAGNOSIS — M79604 Pain in right leg: Secondary | ICD-10-CM | POA: Insufficient documentation

## 2021-12-18 LAB — CBC WITH DIFFERENTIAL/PLATELET
Abs Immature Granulocytes: 0.02 10*3/uL (ref 0.00–0.07)
Basophils Absolute: 0 10*3/uL (ref 0.0–0.1)
Basophils Relative: 1 %
Eosinophils Absolute: 0.4 10*3/uL (ref 0.0–0.5)
Eosinophils Relative: 5 %
HCT: 34.8 % — ABNORMAL LOW (ref 36.0–46.0)
Hemoglobin: 11.9 g/dL — ABNORMAL LOW (ref 12.0–15.0)
Immature Granulocytes: 0 %
Lymphocytes Relative: 24 %
Lymphs Abs: 2 10*3/uL (ref 0.7–4.0)
MCH: 24.5 pg — ABNORMAL LOW (ref 26.0–34.0)
MCHC: 34.2 g/dL (ref 30.0–36.0)
MCV: 71.8 fL — ABNORMAL LOW (ref 80.0–100.0)
Monocytes Absolute: 0.6 10*3/uL (ref 0.1–1.0)
Monocytes Relative: 7 %
Neutro Abs: 5.3 10*3/uL (ref 1.7–7.7)
Neutrophils Relative %: 63 %
Platelets: 270 10*3/uL (ref 150–400)
RBC: 4.85 MIL/uL (ref 3.87–5.11)
RDW: 14.9 % (ref 11.5–15.5)
WBC: 8.5 10*3/uL (ref 4.0–10.5)
nRBC: 0.2 % (ref 0.0–0.2)

## 2021-12-18 LAB — COMPREHENSIVE METABOLIC PANEL
ALT: 20 U/L (ref 0–44)
AST: 15 U/L (ref 15–41)
Albumin: 3.7 g/dL (ref 3.5–5.0)
Alkaline Phosphatase: 69 U/L (ref 38–126)
Anion gap: 5 (ref 5–15)
BUN: 7 mg/dL (ref 6–20)
CO2: 24 mmol/L (ref 22–32)
Calcium: 8.7 mg/dL — ABNORMAL LOW (ref 8.9–10.3)
Chloride: 110 mmol/L (ref 98–111)
Creatinine, Ser: 0.59 mg/dL (ref 0.44–1.00)
GFR, Estimated: >60 mL/min (ref >60)
Glucose, Bld: 124 mg/dL — ABNORMAL HIGH (ref 70–99)
Potassium: 4.1 mmol/L (ref 3.5–5.1)
Sodium: 139 mmol/L (ref 135–145)
Total Bilirubin: 0.8 mg/dL (ref 0.3–1.2)
Total Protein: 7.6 g/dL (ref 6.5–8.1)

## 2021-12-18 LAB — RETICULOCYTES
Immature Retic Fract: 33.2 % — ABNORMAL HIGH (ref 2.3–15.9)
RBC.: 4.74 MIL/uL (ref 3.87–5.11)
Retic Count, Absolute: 198.1 10*3/uL — ABNORMAL HIGH (ref 19.0–186.0)
Retic Ct Pct: 4.2 % — ABNORMAL HIGH (ref 0.4–3.1)

## 2021-12-18 MED ORDER — HYDROMORPHONE 1 MG/ML IV SOLN
INTRAVENOUS | Status: DC
  Administered 2021-12-18: 30 mg via INTRAVENOUS
  Administered 2021-12-18: 12.5 mg via INTRAVENOUS
  Filled 2021-12-18: qty 30

## 2021-12-18 MED ORDER — KETOROLAC TROMETHAMINE 30 MG/ML IJ SOLN
15.0000 mg | Freq: Once | INTRAMUSCULAR | Status: AC
  Administered 2021-12-18: 15 mg via INTRAVENOUS
  Filled 2021-12-18: qty 1

## 2021-12-18 MED ORDER — SODIUM CHLORIDE 0.45 % IV SOLN: INTRAVENOUS | Status: DC

## 2021-12-18 MED ORDER — DIPHENHYDRAMINE HCL 25 MG PO CAPS
25.0000 mg | ORAL_CAPSULE | ORAL | Status: DC | PRN
  Administered 2021-12-18: 25 mg via ORAL
  Filled 2021-12-18: qty 1

## 2021-12-18 MED ORDER — NALOXONE HCL 0.4 MG/ML IJ SOLN: 0.4000 mg | INTRAMUSCULAR | Status: DC | PRN

## 2021-12-18 MED ORDER — SODIUM CHLORIDE 0.9% FLUSH: 9.0000 mL | INTRAVENOUS | Status: DC | PRN

## 2021-12-18 MED ORDER — ACETAMINOPHEN 500 MG PO TABS
1000.0000 mg | ORAL_TABLET | Freq: Once | ORAL | Status: AC
  Administered 2021-12-18: 1000 mg via ORAL
  Filled 2021-12-18: qty 2

## 2021-12-18 MED ORDER — ONDANSETRON HCL 4 MG/2ML IJ SOLN: 4.0000 mg | Freq: Four times a day (QID) | INTRAMUSCULAR | Status: DC | PRN

## 2021-12-18 NOTE — Discharge Summary (Signed)
[facility] Discharge Summary   Patient ID: Katenia Brotman MRN: [id number] DOB/AGE: 1980/06/28 41 y.o.  Admit date: 12/18/2021 Discharge date: 12/18/2021  Primary Care Physician:  Pcp, No  Admission Diagnoses:  Active Problems:   Sickle cell pain crisis [facility]) Discharge Medications:  Allergies as of 12/18/2021   No Known Allergies      Medication List     TAKE these medications    acetaminophen 500 MG tablet Commonly known as: TYLENOL Take 1,000 mg by mouth every 6 (six) hours as needed for mild pain, moderate pain or headache.   folic acid 1 MG tablet Commonly known as: FOLVITE Take 1 mg by mouth daily.   HYDROmorphone 4 MG tablet Commonly known as: DILAUDID Take 4 mg by mouth every 6 (six) hours as needed for severe pain.   ibuprofen 200 MG tablet Commonly known as: ADVIL Take 800 mg by mouth 3 (three) times daily as needed for fever, headache or mild pain.   Oxbryta 500 MG Tabs tablet Generic drug: voxelotor Take 1,500 mg by mouth daily in the afternoon.   oxycodone 30 MG immediate release tablet Commonly known as: ROXICODONE Take 30 mg by mouth every 4 (four) hours as needed for pain.         Consults:  None  Significant Diagnostic Studies:  DG Chest Portable 1 View  Result Date: 12/07/2021 CLINICAL DATA:  Sickle cell anemia, pain EXAM: PORTABLE CHEST 1 VIEW COMPARISON:  11/21/2021 FINDINGS: Mild bilateral lower lobe atelectasis. No pleural effusion or pneumothorax. The heart is top-normal in size. IMPRESSION: Mild bilateral lower lobe atelectasis. Electronically Signed   By: Julian Hy M.D.   On: 12/07/2021 19:25   ECHOCARDIOGRAM COMPLETE  Result Date: 11/22/2021    ECHOCARDIOGRAM REPORT   Patient Name:   [facility] Date of Exam: 11/22/2021 Medical Rec #:  [id number]     Height:       65.0 in Accession #:    [id number]    Weight:       320.0 lb Date of Birth:  07/20/1980    BSA:          2.415 m Patient Age:    58 years       BP:           134/62 mmHg Patient Gender: F             HR:           82 bpm. Exam Location:  Inpatient Procedure: 2D Echo, Color Doppler and Cardiac Doppler Indications:    Pulmonary Hypertension  History:        Patient has prior history of Echocardiogram examinations, most                 recent 11/15/2019. Arrythmias:Tachycardia.  Sonographer:    Memory Argue Referring Phys: [id number] [place]  1. Left ventricular ejection fraction, by estimation, is 60 to 65%. The left ventricle has normal function. The left ventricle has no regional wall motion abnormalities. Left ventricular diastolic parameters are indeterminate.  2. Right ventricular systolic function is normal. The right ventricular size is normal.  3. The mitral valve is normal in structure. Trivial mitral valve regurgitation.  4. The aortic valve is tricuspid. Aortic valve regurgitation is not visualized. Aortic valve sclerosis/calcification is present, without any evidence of aortic stenosis. FINDINGS  Left Ventricle: Left ventricular ejection fraction, by estimation, is 60 to 65%. The left ventricle has normal function. The left ventricle has  no regional wall motion abnormalities. The left ventricular internal cavity size was normal in size. There is  no left ventricular hypertrophy. Left ventricular diastolic parameters are indeterminate. Right Ventricle: The right ventricular size is normal. No increase in right ventricular wall thickness. Right ventricular systolic function is normal. Left Atrium: Left atrial size was normal in size. Right Atrium: Right atrial size was normal in size. Pericardium: There is no evidence of pericardial effusion. Mitral Valve: The mitral valve is normal in structure. Trivial mitral valve regurgitation. Tricuspid Valve: The tricuspid valve is normal in structure. Tricuspid valve regurgitation is trivial. Aortic Valve: The aortic valve is tricuspid. Aortic valve regurgitation is not visualized.  Aortic valve sclerosis/calcification is present, without any evidence of aortic stenosis. Aortic valve mean gradient measures 5.0 mmHg. Aortic valve peak gradient measures 9.6 mmHg. Aortic valve area, by VTI measures 4.32 cm. Pulmonic Valve: The pulmonic valve was not well visualized. Pulmonic valve regurgitation is not visualized. No evidence of pulmonic stenosis. Aorta: The aortic root is normal in size and structure. IAS/Shunts: No atrial level shunt detected by color flow Doppler.  LEFT VENTRICLE PLAX 2D LVIDd:         4.80 cm   Diastology LVIDs:         3.10 cm   LV e' medial:    6.09 cm/s LV PW:         1.10 cm   LV E/e' medial:  17.4 LV IVS:        1.00 cm   LV e' lateral:   5.66 cm/s LVOT diam:     2.80 cm   LV E/e' lateral: 18.7 LV SV:         106 LV SV Index:   44 LVOT Area:     6.16 cm  RIGHT VENTRICLE TAPSE (M-mode): 2.3 cm LEFT ATRIUM             Index        RIGHT ATRIUM           Index LA diam:        4.00 cm 1.66 cm/m   RA Area:     12.00 cm LA Vol (A2C):   76.7 ml 31.75 ml/m  RA Volume:   24.30 ml  10.06 ml/m LA Vol (A4C):   57.4 ml 23.76 ml/m LA Biplane Vol: 67.7 ml 28.03 ml/m  AORTIC VALVE AV Area (Vmax):    3.96 cm AV Area (Vmean):   3.99 cm AV Area (VTI):     4.32 cm AV Vmax:           155.00 cm/s AV Vmean:          97.700 cm/s AV VTI:            0.245 m AV Peak Grad:      9.6 mmHg AV Mean Grad:      5.0 mmHg LVOT Vmax:         99.70 cm/s LVOT Vmean:        63.300 cm/s LVOT VTI:          0.172 m LVOT/AV VTI ratio: 0.70  AORTA Ao Root diam: 3.50 cm Ao Asc diam:  3.00 cm MITRAL VALVE MV Area (PHT): 4.06 cm     SHUNTS MV Decel Time: 187 msec     Systemic VTI:  0.17 m MV E velocity: 106.00 cm/s  Systemic Diam: 2.80 cm MV A velocity: 108.00 cm/s MV E/A ratio:  0.98 Dietrich Pates MD Electronically signed  by Dorris Carnes MD Signature Date/Time: 11/22/2021/4:03:46 PM    Final    DG Chest Portable 1 View  Result Date: 11/21/2021 CLINICAL DATA:  Chest pain EXAM: PORTABLE CHEST 1 VIEW  COMPARISON:  04/02/21 FINDINGS: No pleural effusion. No pneumothorax. Cardiomegaly. No focal airspace opacity. Prominent interstitial opacities, could be seen in the setting of pulmonary venous congestion without overt pulmonary edema. No displaced rib fractures. Visualized upper abdomen is unremarkable. IMPRESSION: 1. No radiographic finding to explain chest pain. 2. Cardiomegaly with pulmonary venous congestion. No overt pulmonary edema. No focal airspace opacity. Electronically Signed   By: Marin Roberts M.D.   On: 11/21/2021 16:13   CT Angio Chest PE W and/or Wo Contrast  Result Date: 11/21/2021 CLINICAL DATA:  Chest pain, pulmonary embolism suspected. EXAM: CT ANGIOGRAPHY CHEST WITH CONTRAST TECHNIQUE: Multidetector CT imaging of the chest was performed using the standard protocol during bolus administration of intravenous contrast. Multiplanar CT image reconstructions and MIPs were obtained to evaluate the vascular anatomy. RADIATION DOSE REDUCTION: This exam was performed according to the departmental dose-optimization program which includes automated exposure control, adjustment of the mA and/or kV according to patient size and/or use of iterative reconstruction technique. CONTRAST:  135mL OMNIPAQUE IOHEXOL 350 MG/ML SOLN COMPARISON:  CT examination dated April 02, 2021 FINDINGS: Cardiovascular: Satisfactory opacification of the pulmonary arteries to the segmental level. No evidence of pulmonary embolism. Evaluation of distal segmental and subsegmental pulmonary arteries is limited due to phase of contrast enhancement and respiratory motion. Heart is mildly enlarged. Main pulmonary trunk is dilated measuring up to 3.2 cm concerning for pulmonary arterial hypertension. No pericardial effusion. Mediastinum/Nodes: No enlarged mediastinal, hilar, or axillary lymph nodes. Thyroid gland, trachea, and esophagus demonstrate no significant findings. Lungs/Pleura: No focal consolidation or pleural effusion. Low  lung volumes. Bibasilar dependent atelectasis. 6 mm nodule in the superior aspect of the right middle lobe (series 6, image 58). Upper Abdomen: No acute abnormality. Musculoskeletal: No chest wall abnormality. No acute or significant osseous findings. Review of the MIP images confirms the above findings. IMPRESSION: 1. No evidence of central pulmonary embolism. Evaluation of distal segmental and subsegmental pulmonary arteries is limited due to phase of contrast enhancement and respiratory motion. 2. Mild cardiomegaly. 3. Main pulmonary trunk is dilated measuring up to 3.2 cm concerning for pulmonary arterial hypertension. 4. Low lung volumes with bibasilar dependent atelectasis. No focal consolidation or pleural effusion. 5. 6 mm nodule in the superior aspect of the right middle lobe, unchanged. Electronically Signed   By: Keane Police D.O.   On: 11/21/2021 15:44   History of Present Illness: Nykesha Swales is a 41 year old female with a medical history significant for sickle cell disease, chronic pain syndrome, opiate dependence/tolerance, and morbid obesity presents with complaints of pain to left arm and bilateral lower extremities. Patient says that pain is consistent with her typical pain crisis.  Patient states that she has been taking oxycodone and Dilaudid interchangeably without very much relief.  Currently, pain intensity is 10/10, constant, and throbbing. She denies fever, chills, chest pain, or shortness of breath.  No urinary symptoms, nausea, vomiting, or diarrhea.  No sick contacts, recent travel, or known exposure to COVID-19.    [facility] Course:  Patient admitted to sickle cell day infusion clinic for management of pain crisis. Reviewed all laboratory values, largely consistent with patient's baseline. Pain managed with IV Dilaudid PCA IV Toradol, 15 mg x 1 Tylenol 1000 mg x 1 IV fluids, 0.45%  saline at 100 mL/h. Pain intensity decreased to 6/10 throughout admission.   Patient is requesting discharge home. She is alert, oriented, and ambulating without assistance. Patient will discharge home in a hemodynamically stable condition.  Discharge instructions: Resume all home medications.   Follow up with PCP as previously  scheduled.   Discussed the importance of drinking 64 ounces of water daily, dehydration of red blood cells may lead further sickling.   Avoid all stressors that precipitate sickle cell pain crisis.     The patient was given clear instructions to go to ER or return to medical center if symptoms do not improve, worsen or new problems develop.   Physical Exam at Discharge:  BP 120/61 (BP Location: Left Arm)   Pulse 98   Temp 98.3 F (36.8 C) (Temporal)   Resp 18   LMP 10/31/2021 (Exact Date)   SpO2 96%  Physical Exam Constitutional:      Appearance: Normal appearance.  Eyes:     Pupils: Pupils are equal, round, and reactive to light.  Cardiovascular:     Rate and Rhythm: Normal rate and regular rhythm.     Pulses: Normal pulses.  Pulmonary:     Effort: Pulmonary effort is normal.  Abdominal:     General: Bowel sounds are normal.  Musculoskeletal:        General: Normal range of motion.  Skin:    General: Skin is warm.  Neurological:     General: No focal deficit present.     Mental Status: She is alert. Mental status is at baseline.  Psychiatric:        Mood and Affect: Mood normal.        Behavior: Behavior normal.        Thought Content: Thought content normal.        Judgment: Judgment normal.      Disposition at Discharge: Discharge disposition: 01-Home or Self Care       Discharge Orders: Discharge Instructions     Discharge patient   Complete by: As directed    Discharge disposition: 01-Home or Self Care   Discharge patient date: 12/18/2021       Condition at Discharge:   Stable  Time spent on Discharge:  Greater than 30 minutes.  Signed: [name]  APRN, MSN, FNP-C Patient  [facility] [address] [place], [place] [address] [phone]  12/18/2021, 4:12 PM

## 2021-12-18 NOTE — Telephone Encounter (Signed)
Patient called in. Complains of pain in bilateral legs and L arm rates 7/10. Denied chest pain, abd pain, fever, N/V/D. Wants to come in for treatment. Last took Oxycodone 30mg   at 3:00am. Pt states she has not been to the ED recently. Pt states her friend is her transportation today. Pt denies recent covid exposure, or flu like symptoms today. FNP made aware, approved pt to be seen in the day hospital today, however inform pt that she will not receive hospital admission. Pt notified, verbalized understanding and states she does not feel she needs hospital admission today.

## 2021-12-18 NOTE — Progress Notes (Signed)
Patient admitted to the day hospital for treatment of sickle cell pain crisis. Patient reported pain rated 8/10 in the legs and left arm. Patient placed on Dilaudid PCA, given IV Toradol, PO Tylenol, PO Benadryl and hydrated with IV fluids. At discharge patient reported  pain at 5/10. Declined printed AVS. Patient alert, oriented and ambulatory at discharge.

## 2021-12-18 NOTE — H&P (Signed)
[facility] History and Physical   Date: 12/18/2021  Patient name: Stephanie Burch Medical record number: [id number] Date of birth: 03/20/1980 Age: 41 y.o. Gender: female PCP: Pcp, No  Attending physician: Quentin Angst, MD  Chief Complaint: Sickle cell pain   History of Present Illness: Stephanie Burch is a 41 year old female with a medical history significant for sickle cell disease, chronic pain syndrome, opiate dependence/tolerance, and morbid obesity presents with complaints of pain to left arm and bilateral lower extremities. Patient says that pain is consistent with her typical pain crisis.  Patient states that she has been taking oxycodone and Dilaudid interchangeably without very much relief.  Currently, pain intensity is 10/10, constant, and throbbing. She denies fever, chills, chest pain, or shortness of breath.  No urinary symptoms, nausea, vomiting, or diarrhea.  No sick contacts, recent travel, or known exposure to COVID-19.   Meds: Medications Prior to Admission  Medication Sig Dispense Refill Last Dose   acetaminophen (TYLENOL) 500 MG tablet Take 1,000 mg by mouth every 6 (six) hours as needed for mild pain, moderate pain or headache.      folic acid (FOLVITE) 1 MG tablet Take 1 mg by mouth daily.      HYDROmorphone (DILAUDID) 4 MG tablet Take 4 mg by mouth every 6 (six) hours as needed for severe pain.      ibuprofen (ADVIL) 200 MG tablet Take 800 mg by mouth 3 (three) times daily as needed for fever, headache or mild pain.      OXBRYTA 500 MG TABS tablet Take 1,500 mg by mouth daily in the afternoon.      oxycodone (ROXICODONE) 30 MG immediate release tablet Take 30 mg by mouth every 4 (four) hours as needed for pain.       Allergies: Patient has no known allergies. Past Medical History:  Diagnosis Date   Acute kidney injury (HCC) 11/07/2019   Anemia    Chronic pain    COVID-19 virus detected 10/22/2019   Depression    HCAP (healthcare-associated  pneumonia) 11/07/2019   History of blood transfusion 2013   x 2   HSV infection    Hypoxia    Pneumonia    x 1   Pulmonary edema 11/11/2019   Sepsis (HCC) 11/07/2019   Sickle cell anemia (HCC) 09/2021   gets them frequently   Tachycardia with heart rate 100-120 beats per minute    Vitamin B12 deficiency 12/2018   Vitamin D deficiency    Past Surgical History:  Procedure Laterality Date   CESAREAN SECTION  2013   x 1   TOOTH EXTRACTION N/A 10/18/2021   Procedure: DENTAL RESTORATION/EXTRACTIONS;  Surgeon: Ocie Doyne, DMD;  Location: [facility] OR;  Service: Oral Surgery;  Laterality: N/A;   Family History  Problem Relation Age of Onset   Hypertension Mother    Sickle cell trait Mother    Diabetes Father    Sickle cell trait Father    Social History   Socioeconomic History   Marital status: Single    Spouse name: Not on file   Number of children: Not on file   Years of education: Not on file   Highest education level: Not on file  Occupational History   Not on file  Tobacco Use   Smoking status: Never   Smokeless tobacco: Never  Vaping Use   Vaping Use: Never used  Substance and Sexual Activity   Alcohol use: Not Currently   Drug use: Never  Sexual activity: Yes    Birth control/protection: None  Other Topics Concern   Not on file  Social History Narrative   Not on file   Social Determinants of Health   Financial Resource Strain: Not on file  Food Insecurity: No Food Insecurity (12/08/2021)   Hunger Vital Sign    Worried About Running Out of Food in the Last Year: Never true    Ran Out of Food in the Last Year: Never true  Transportation Needs: No Transportation Needs (12/08/2021)   PRAPARE - Administrator, Civil Service (Medical): No    Lack of Transportation (Non-Medical): No  Physical Activity: Not on file  Stress: Not on file  Social Connections: Not on file  Intimate Partner Violence: Not At Risk (12/08/2021)   Humiliation, Afraid, Rape, and  Kick questionnaire    Fear of Current or Ex-Partner: No    Emotionally Abused: No    Physically Abused: No    Sexually Abused: No   Review of Systems  Constitutional:  Negative for chills and fever.  HENT: Negative.    Eyes: Negative.   Respiratory: Negative.    Cardiovascular: Negative.   Gastrointestinal: Negative.   Genitourinary: Negative.   Musculoskeletal:  Positive for back pain and joint pain.  Skin: Negative.   Neurological: Negative.   Psychiatric/Behavioral: Negative.       Physical Exam: Last menstrual period 11/30/2021. Physical Exam Constitutional:      Appearance: Normal appearance. She is obese.  Cardiovascular:     Rate and Rhythm: Normal rate and regular rhythm.     Pulses: Normal pulses.  Pulmonary:     Effort: Pulmonary effort is normal.  Abdominal:     General: Bowel sounds are normal.  Skin:    General: Skin is warm.  Neurological:     General: No focal deficit present.     Mental Status: She is alert. Mental status is at baseline.  Psychiatric:        Mood and Affect: Mood normal.        Thought Content: Thought content normal.        Judgment: Judgment normal.      Lab results: No results found for this or any previous visit (from the past 24 hour(s)).  Imaging results:  No results found.   Assessment & Plan: Patient admitted to sickle cell day infusion center for management of pain crisis.  Patient is opiate tolerant Initiate IV dilaudid PCA.  IV fluids, 0.45% saline at 100 ml/hr Toradol 15 mg IV times one dose Tylenol 1000 mg by mouth times one dose Review CBC with differential, complete metabolic panel, and reticulocytes as results become available. Pain intensity will be reevaluated in context of functioning and relationship to baseline as care progresses If pain intensity remains elevated and/or sudden change in hemodynamic stability transition to inpatient services for higher level of care.     Nolon Nations  APRN,  MSN, FNP-C Patient Care [facility] [address] [place], [place] [address] [phone]  12/18/2021, 10:08 AM

## 2021-12-19 ENCOUNTER — Other Ambulatory Visit: Payer: Self-pay

## 2021-12-19 ENCOUNTER — Observation Stay (HOSPITAL_COMMUNITY)
Admission: EM | Admit: 2021-12-19 | Discharge: 2021-12-20 | Disposition: A | Discharge status: Home / Self Care | Payer: Medicare Other | Attending: Internal Medicine | Admitting: Internal Medicine

## 2021-12-19 ENCOUNTER — Encounter (HOSPITAL_COMMUNITY): Payer: Self-pay

## 2021-12-19 ENCOUNTER — Telehealth (HOSPITAL_COMMUNITY): Payer: Self-pay | Admitting: *Deleted

## 2021-12-19 DIAGNOSIS — Z79899 Other long term (current) drug therapy: Secondary | ICD-10-CM | POA: Insufficient documentation

## 2021-12-19 DIAGNOSIS — Z8616 Personal history of COVID-19: Secondary | ICD-10-CM | POA: Insufficient documentation

## 2021-12-19 DIAGNOSIS — D5700 Hb-SS disease with crisis, unspecified: Secondary | ICD-10-CM | POA: Diagnosis present

## 2021-12-19 DIAGNOSIS — Z6841 Body Mass Index (BMI) 40.0 and over, adult: Secondary | ICD-10-CM

## 2021-12-19 DIAGNOSIS — G894 Chronic pain syndrome: Secondary | ICD-10-CM | POA: Diagnosis present

## 2021-12-19 LAB — I-STAT BETA HCG BLOOD, ED (MC, WL, AP ONLY): I-stat hCG, quantitative: <5 m[IU]/mL (ref <5)

## 2021-12-19 MED ORDER — ONDANSETRON HCL 4 MG/2ML IJ SOLN
4.0000 mg | INTRAMUSCULAR | Status: DC | PRN
  Administered 2021-12-19 – 2021-12-20 (×3): 4 mg via INTRAVENOUS
  Filled 2021-12-19 (×3): qty 2

## 2021-12-19 MED ORDER — HYDROMORPHONE HCL 2 MG/ML IJ SOLN
2.0000 mg | INTRAMUSCULAR | Status: AC
  Administered 2021-12-19: 2 mg via INTRAVENOUS
  Filled 2021-12-19: qty 1

## 2021-12-19 MED ORDER — OXYCODONE HCL 5 MG PO TABS: 30.0000 mg | ORAL_TABLET | Freq: Four times a day (QID) | ORAL | Status: DC | PRN

## 2021-12-19 MED ORDER — SODIUM CHLORIDE 0.9 % IV SOLN
12.5000 mg | Freq: Once | INTRAVENOUS | Status: AC
  Administered 2021-12-19: 12.5 mg via INTRAVENOUS
  Filled 2021-12-19: qty 12.5

## 2021-12-19 MED ORDER — ENOXAPARIN SODIUM 40 MG/0.4ML IJ SOSY: 40.0000 mg | PREFILLED_SYRINGE | INTRAMUSCULAR | Status: DC

## 2021-12-19 MED ORDER — VOXELOTOR 500 MG PO TABS: 1500.0000 mg | ORAL_TABLET | Freq: Every day | ORAL | Status: DC

## 2021-12-19 MED ORDER — FOLIC ACID 1 MG PO TABS
1.0000 mg | ORAL_TABLET | Freq: Every day | ORAL | Status: DC
  Administered 2021-12-19 – 2021-12-20 (×2): 1 mg via ORAL
  Filled 2021-12-19 (×2): qty 1

## 2021-12-19 MED ORDER — SODIUM CHLORIDE 0.9% FLUSH: 9.0000 mL | INTRAVENOUS | Status: DC | PRN

## 2021-12-19 MED ORDER — HYDROMORPHONE HCL 2 MG/ML IJ SOLN
2.0000 mg | INTRAMUSCULAR | Status: DC | PRN
  Administered 2021-12-19: 2 mg via INTRAVENOUS
  Filled 2021-12-19: qty 1

## 2021-12-19 MED ORDER — KETOROLAC TROMETHAMINE 15 MG/ML IJ SOLN
15.0000 mg | Freq: Four times a day (QID) | INTRAMUSCULAR | Status: DC
  Administered 2021-12-19 – 2021-12-20 (×4): 15 mg via INTRAVENOUS
  Filled 2021-12-19 (×5): qty 1

## 2021-12-19 MED ORDER — SENNOSIDES-DOCUSATE SODIUM 8.6-50 MG PO TABS
1.0000 | ORAL_TABLET | Freq: Two times a day (BID) | ORAL | Status: DC
  Administered 2021-12-19: 1 via ORAL
  Filled 2021-12-19 (×3): qty 1

## 2021-12-19 MED ORDER — POLYETHYLENE GLYCOL 3350 17 G PO PACK: 17.0000 g | PACK | Freq: Every day | ORAL | Status: DC | PRN

## 2021-12-19 MED ORDER — KETOROLAC TROMETHAMINE 15 MG/ML IJ SOLN
15.0000 mg | INTRAMUSCULAR | Status: AC
  Administered 2021-12-19: 15 mg via INTRAVENOUS
  Filled 2021-12-19: qty 1

## 2021-12-19 MED ORDER — DIPHENHYDRAMINE HCL 25 MG PO CAPS: 25.0000 mg | ORAL_CAPSULE | ORAL | Status: DC | PRN

## 2021-12-19 MED ORDER — HYDROMORPHONE 1 MG/ML IV SOLN
INTRAVENOUS | Status: DC
  Administered 2021-12-19: 30 mg via INTRAVENOUS
  Administered 2021-12-19: 11.8 mg via INTRAVENOUS
  Administered 2021-12-19: 9.6 mg via INTRAVENOUS
  Administered 2021-12-20: 30 mg via INTRAVENOUS
  Filled 2021-12-19 (×2): qty 30

## 2021-12-19 MED ORDER — SODIUM CHLORIDE 0.45 % IV SOLN: INTRAVENOUS | Status: DC

## 2021-12-19 MED ORDER — ONDANSETRON HCL 4 MG/2ML IJ SOLN: 4.0000 mg | Freq: Four times a day (QID) | INTRAMUSCULAR | Status: DC | PRN

## 2021-12-19 MED ORDER — NALOXONE HCL 0.4 MG/ML IJ SOLN: 0.4000 mg | INTRAMUSCULAR | Status: DC | PRN

## 2021-12-19 MED ORDER — ENOXAPARIN SODIUM 80 MG/0.8ML IJ SOSY
70.0000 mg | PREFILLED_SYRINGE | INTRAMUSCULAR | Status: DC
  Administered 2021-12-19: 70 mg via SUBCUTANEOUS
  Filled 2021-12-19: qty 0.8
  Filled 2021-12-19: qty 0.7

## 2021-12-19 NOTE — H&P (Signed)
H&P  Patient Demographics:  Stephanie Burch, is a 41 y.o. female  MRN: [id number]   DOB - 07/09/1980  Admit Date - 12/19/2021  Outpatient Primary MD for the patient is Pcp, No  Chief Complaint  Patient presents with   Sickle Cell Pain Crisis      HPI:   Stephanie Burch  is a 41 y.o. female is a 41 year old female with a medical history significant for sickle cell disease, chronic pain syndrome, opiate dependence and tolerance, and history of anemia of chronic disease presents to the emergency department with complaints of bilateral lower extremity pain.  Pain is consistent with her typical crisis.  Patient was treated and evaluated at the sickle cell day hospital for this problem on yesterday.  Pain intensity had improved upon discharge, but returned overnight.  She currently rates her pain as 10/10, constant, and throbbing.  Patient has been taking home oxycodone and Dilaudid without very much relief.  She denies any fever, chills, chest pain, or shortness of breath.  No urinary symptoms, nausea, vomiting, or diarrhea.  No sick contacts, recent travel, or known exposure to COVID-19.  ER course: While in ER, vital signs remained stable.BP 125/83   Pulse 83   Temp 98.7 F (37.1 C) (Oral)   Resp 18   LMP 10/31/2021 (Exact Date)   SpO2 99%  CBC notable for hemoglobin 11.9, consistent with patient's baseline.  CBC is otherwise unremarkable.  Complete metabolic panel unremarkable.  Patient's pain persists despite IV Dilaudid, IV fluids, and IV Toradol.  Patient will be admitted in observation for further management of her pain crisis.   Review of systems:   No fever or chills No Headache, No changes with vision or hearing No problems swallowing food or liquids No chest pain, cough or shortness of breath No abdominal pain, No nausea or vomiting, Bowel movements are regular No blood in stool or urine No dysuria No new skin rashes or bruises No new joints pains-aches No new weakness,  tingling, numbness in any extremity No recent weight gain or loss No polyuria, polydypsia or polyphagia No significant Mental Stressors  With Past History of the following :   Past Medical History:  Diagnosis Date   Acute kidney injury (HCC) 11/07/2019   Anemia    Chronic pain    COVID-19 virus detected 10/22/2019   Depression    HCAP (healthcare-associated pneumonia) 11/07/2019   History of blood transfusion 2013   x 2   HSV infection    Hypoxia    Pneumonia    x 1   Pulmonary edema 11/11/2019   Sepsis (HCC) 11/07/2019   Sickle cell anemia (HCC) 09/2021   gets them frequently   Tachycardia with heart rate 100-120 beats per minute    Vitamin B12 deficiency 12/2018   Vitamin D deficiency       Past Surgical History:  Procedure Laterality Date   CESAREAN SECTION  2013   x 1   TOOTH EXTRACTION N/A 10/18/2021   Procedure: DENTAL RESTORATION/EXTRACTIONS;  Surgeon: Ocie Doyne, DMD;  Location: [facility] OR;  Service: Oral Surgery;  Laterality: N/A;     Social History:   Social History   Tobacco Use   Smoking status: Never   Smokeless tobacco: Never  Substance Use Topics   Alcohol use: Not Currently     Lives - At home   Family History :   Family History  Problem Relation Age of Onset   Hypertension Mother    Sickle cell trait Mother  Diabetes Father    Sickle cell trait Father      Home Medications:   Prior to Admission medications   Medication Sig Start Date End Date Taking? Authorizing Provider  acetaminophen (TYLENOL) 500 MG tablet Take 1,000 mg by mouth every 6 (six) hours as needed for mild pain, moderate pain or headache.    [provider]  folic acid (FOLVITE) 1 MG tablet Take 1 mg by mouth daily.    [provider]  HYDROmorphone (DILAUDID) 4 MG tablet Take 4 mg by mouth every 6 (six) hours as needed for severe pain.    [provider]  ibuprofen (ADVIL) 200 MG tablet Take 800 mg by mouth 3 (three) times daily as needed for  fever, headache or mild pain.    [provider]  OXBRYTA 500 MG TABS tablet Take 1,500 mg by mouth daily in the afternoon. 10/04/21   [provider]  oxycodone (ROXICODONE) 30 MG immediate release tablet Take 30 mg by mouth every 4 (four) hours as needed for pain.    [provider]     Allergies:   No Known Allergies   Physical Exam:   Vitals:   Vitals:   12/19/21 1249 12/19/21 1300  BP:  125/83  Pulse:  83  Resp:  18  Temp: 98.7 F (37.1 C)   SpO2:  99%    Physical Exam: Constitutional: Patient appears well-developed and well-nourished. Not in obvious distress. HENT: Normocephalic, atraumatic, External right and left ear normal. Oropharynx is clear and moist.  Eyes: Conjunctivae and EOM are normal. PERRLA, no scleral icterus. Neck: Normal ROM. Neck supple. No JVD. No tracheal deviation. No thyromegaly. CVS: RRR, S1/S2 +, no murmurs, no gallops, no carotid bruit.  Pulmonary: Effort and breath sounds normal, no stridor, rhonchi, wheezes, rales.  Abdominal: Soft. BS +, no distension, tenderness, rebound or guarding.  Musculoskeletal: Normal range of motion. No edema and no tenderness.  Lymphadenopathy: No lymphadenopathy noted, cervical, inguinal or axillary Neuro: Alert. Normal reflexes, muscle tone coordination. No cranial nerve deficit. Skin: Skin is warm and dry. No rash noted. Not diaphoretic. No erythema. No pallor. Psychiatric: Normal mood and affect. Behavior, judgment, thought content normal.   Data Review:   CBC Recent Labs  Lab 12/18/21 1005  WBC 8.5  HGB 11.9*  HCT 34.8*  PLT 270  MCV 71.8*  MCH 24.5*  MCHC 34.2  RDW 14.9  LYMPHSABS 2.0  MONOABS 0.6  EOSABS 0.4  BASOSABS 0.0   ------------------------------------------------------------------------------------------------------------------  Chemistries  Recent Labs  Lab 12/18/21 1005  NA 139  K 4.1  CL 110  CO2 24  GLUCOSE 124*  BUN 7  CREATININE 0.59  CALCIUM  8.7*  AST 15  ALT 20  ALKPHOS 69  BILITOT 0.8   ------------------------------------------------------------------------------------------------------------------ estimated creatinine clearance is 139.8 mL/min (by C-G formula based on SCr of 0.59 mg/dL). ------------------------------------------------------------------------------------------------------------------ No results for input(s): "TSH", "T4TOTAL", "T3FREE", "THYROIDAB" in the last 72 hours.  Invalid input(s): "FREET3"  Coagulation profile No results for input(s): "INR", "PROTIME" in the last 168 hours. ------------------------------------------------------------------------------------------------------------------- No results for input(s): "DDIMER" in the last 72 hours. -------------------------------------------------------------------------------------------------------------------  Cardiac Enzymes No results for input(s): "CKMB", "TROPONINI", "MYOGLOBIN" in the last 168 hours.  Invalid input(s): "CK" ------------------------------------------------------------------------------------------------------------------    Component Value Date/Time   BNP 17.5 11/21/2021 1403    ---------------------------------------------------------------------------------------------------------------  Urinalysis    Component Value Date/Time   COLORURINE YELLOW 11/21/2021 1323   APPEARANCEUR CLEAR 11/21/2021 1323   LABSPEC 1.016 11/21/2021 1323  PHURINE 5.0 11/21/2021 1323   GLUCOSEU NEGATIVE 11/21/2021 1323   HGBUR NEGATIVE 11/21/2021 1323   BILIRUBINUR NEGATIVE 11/21/2021 1323   BILIRUBINUR Negative 04/27/2019 0935   KETONESUR NEGATIVE 11/21/2021 1323   PROTEINUR 100 (A) 11/21/2021 1323   UROBILINOGEN 1.0 04/27/2019 0935   NITRITE NEGATIVE 11/21/2021 1323   LEUKOCYTESUR NEGATIVE 11/21/2021 1323    ----------------------------------------------------------------------------------------------------------------   Imaging  Results:    No results found.   Assessment & Plan:  Principal Problem:   Sickle cell pain crisis (HCC) Active Problems:   BMI 50.0-59.9, adult (HCC)   Chronic pain syndrome   Sickle cell disease with pain crisis: Patient admitted in observation for further management of sickle cell related pain.  Pain is primarily to hips bilaterally.  While in ER, Dilaudid 2 mg every 2 hours as needed As bed becomes available, initiate IV Dilaudid PCA with settings of 0.6/10/4 Toradol 15 mg IV every 6 hours Oxycodone 30 mg every 6 hours as needed for severe breakthrough pain Monitor vital signs very closely, reevaluate pain scale regularly, and supplemental oxygen as needed Patient will be reevaluated for pain in the context of function and relationship to baseline as care progresses  Morbid obesity: The patient is asked to make an attempt to improve diet and exercise patterns to aid in medical management of this problem.  Chronic pain syndrome: Continue home medications  Anemia of chronic disease: Hemoglobin is stable and consistent with patient's baseline.  There is no clinical indication for blood transfusion at this time.  Follow labs in AM.  DVT Prophylaxis: Subcut Lovenox   AM Labs Ordered, also please review Full Orders  Family Communication: Admission, patient's condition and plan of care including tests being ordered have been discussed with the patient who indicate understanding and agree with the plan and Code Status.  Code Status: Full Code  Consults called: None    Admission status: Inpatient    Time spent in minutes : 30 minutes  Nolon Nations  APRN, MSN, FNP-C Patient Care [facility] [address] [place], [place] [address] [phone]  12/19/2021 at 1:16 PM

## 2021-12-19 NOTE — Telephone Encounter (Signed)
Patient called requesting to come to the day hospital for sickle cell pain. Patient was treated in the day hospital yesterday for pain crisis.  Patient advised that the day hospital will close at noon today and she would be treated with IV pushes instead of PCA. Patient decided to go to the ED for treatment since she will most likely receive the same treatment there and she may require admission to the hospital.

## 2021-12-19 NOTE — ED Triage Notes (Signed)
C/o scc with pain to left arm and bilateral legs x2 days. Went to sickle cell clinic yesterday without relief. Patient took home meds @ 6am. Denies fever, cp, sob, n/v

## 2021-12-19 NOTE — ED Provider Notes (Signed)
[facility]-EMERGENCY DEPT Provider Note   CSN: [id number] Arrival date & time: 12/19/21  0836     History  Chief Complaint  Patient presents with   Sickle Cell Pain Crisis    Stephanie Burch is a 41 y.o. female.  She has a history of sickle cell disease and frequent pain crises.  She was seen yesterday in the sickle cell clinic for pain in her legs and left arm.  She said she received treatment but did not completely get relief and the pain was worse today not controlled by her oral pain regimen.  She called the sickle cell clinic but they were unable to accept her today and so she presented here for evaluation.  No trauma fevers cough shortness of breath chest pain.  Her pain in her arm and legs is typical of her sickle cell exacerbation.  The history is provided by the patient.  Sickle Cell Pain Crisis Location:  Lower extremity and upper extremity Severity:  Severe Onset quality:  Gradual Duration:  2 days Similar to previous crisis episodes: yes   Timing:  Constant Progression:  Unchanged Chronicity:  Recurrent Relieved by:  Nothing Worsened by:  Movement and activity Ineffective treatments:  Prescription drugs Associated symptoms: no chest pain, no cough, no fever, no nausea, no shortness of breath and no vomiting        Home Medications Prior to Admission medications   Medication Sig Start Date End Date Taking? Authorizing Provider  acetaminophen (TYLENOL) 500 MG tablet Take 1,000 mg by mouth every 6 (six) hours as needed for mild pain, moderate pain or headache.    [provider]  folic acid (FOLVITE) 1 MG tablet Take 1 mg by mouth daily.    [provider]  HYDROmorphone (DILAUDID) 4 MG tablet Take 4 mg by mouth every 6 (six) hours as needed for severe pain.    [provider]  ibuprofen (ADVIL) 200 MG tablet Take 800 mg by mouth 3 (three) times daily as needed for fever, headache or mild pain.    [provider]  OXBRYTA 500 MG TABS tablet Take 1,500 mg by mouth daily in the afternoon. 10/04/21   [provider]  oxycodone (ROXICODONE) 30 MG immediate release tablet Take 30 mg by mouth every 4 (four) hours as needed for pain.    [provider]      Allergies    Patient has no known allergies.    Review of Systems   Review of Systems  Constitutional:  Negative for fever.  Eyes:  Negative for visual disturbance.  Respiratory:  Negative for cough and shortness of breath.   Cardiovascular:  Negative for chest pain.  Gastrointestinal:  Negative for nausea and vomiting.    Physical Exam Updated Vital Signs BP (!) 152/107 (BP Location: Right Arm)   Pulse 78   Temp 98.7 F (37.1 C) (Oral)   Resp 20   LMP 10/31/2021 (Exact Date)   SpO2 99%  Physical Exam Vitals and nursing note reviewed.  Constitutional:      General: She is not in acute distress.    Appearance: Normal appearance. She is well-developed. She is obese.  HENT:     Head: Normocephalic and atraumatic.  Eyes:     Conjunctiva/sclera: Conjunctivae normal.  Cardiovascular:     Rate and Rhythm: Normal rate and regular rhythm.     Heart sounds: No murmur heard. Pulmonary:     Effort: Pulmonary effort is normal. No respiratory  distress.     Breath sounds: Normal breath sounds.  Abdominal:     Palpations: Abdomen is soft.     Tenderness: There is no abdominal tenderness. There is no guarding or rebound.  Musculoskeletal:        General: Tenderness present. Normal range of motion.     Cervical back: Neck supple.  Skin:    General: Skin is warm and dry.     Capillary Refill: Capillary refill takes less than 2 seconds.  Neurological:     General: No focal deficit present.     Mental Status: She is alert.     ED Results / Procedures / Treatments   Labs (all labs ordered are listed, but only abnormal results are displayed) Labs Reviewed  CBC  I-STAT BETA HCG BLOOD, ED ([facility], [facility], [facility] ONLY)     EKG None  Radiology No results found.  Procedures Procedures    Medications Ordered in ED Medications  HYDROmorphone (DILAUDID) injection 2 mg (has no administration in time range)  HYDROmorphone (DILAUDID) injection 2 mg (has no administration in time range)  HYDROmorphone (DILAUDID) injection 2 mg (has no administration in time range)  diphenhydrAMINE (BENADRYL) 12.5 mg in sodium chloride 0.9 % 50 mL IVPB (has no administration in time range)  ondansetron (ZOFRAN) injection 4 mg (has no administration in time range)  ketorolac (TORADOL) 15 MG/ML injection 15 mg (has no administration in time range)    ED Course/ Medical Decision Making/ A&P Clinical Course as of 12/19/21 2041  Thu Dec 19, 2021  0912 I called the sickle cell coordinator NP [facility] [place] who said the sickle cell clinic was closing at 12 AM was unable to accept anybody today.  She agreed with pain management here per protocol. [MB]  1109 Patient states her pain is a little bit better after second dose but not as well as she would hope.  Will reassess after third dose. [MB]  1209 After third dose patient is not sure if she is able to go home because patient does not feel her symptoms are well controlled.  Discussed with NP Hollis from sickle cell team who will evaluate patient for possible admission. [MB]    Clinical Course User Index [MB] Terrilee Files, MD                           Medical Decision Making Risk Prescription drug management. Decision regarding hospitalization.   This patient complains of pain in her legs and left arm; this involves an extensive number of treatment Options and is a complaint that carries with it a high risk of complications and morbidity. The differential includes sickle cell crisis, occlusive crisis, threatened limb  I ordered, reviewed and interpreted labs, which included pregnancy test negative I ordered medication IV pain medication and reviewed PMP when  indicated. Previous records obtained and reviewed in epic including labs from yesterday and sickle cell clinic notes I consulted sickle cell team and discussed lab and imaging findings and discussed disposition.  Cardiac monitoring reviewed, normal sinus rhythm Social determinants considered, no significant barriers Critical Interventions: None  After the interventions stated above, I reevaluated the patient and found patient to still be uncomfortable although otherwise well-appearing Admission and further testing considered, she does not feel she can manage her symptoms at home.  Have asked the sickle cell team to evaluate for possible admission.         Final Clinical Impression(s) / ED Diagnoses  Final diagnoses:  Sickle cell pain crisis [facility])    Rx / DC Orders ED Discharge Orders     None         Terrilee Files, MD 12/19/21 2043

## 2021-12-20 DIAGNOSIS — D5700 Hb-SS disease with crisis, unspecified: Secondary | ICD-10-CM | POA: Diagnosis not present

## 2021-12-20 LAB — CBC
HCT: 32.4 % — ABNORMAL LOW (ref 36.0–46.0)
Hemoglobin: 10.9 g/dL — ABNORMAL LOW (ref 12.0–15.0)
MCH: 24.4 pg — ABNORMAL LOW (ref 26.0–34.0)
MCHC: 33.6 g/dL (ref 30.0–36.0)
MCV: 72.5 fL — ABNORMAL LOW (ref 80.0–100.0)
Platelets: 240 10*3/uL (ref 150–400)
RBC: 4.47 MIL/uL (ref 3.87–5.11)
RDW: 14.7 % (ref 11.5–15.5)
WBC: 11.5 10*3/uL — ABNORMAL HIGH (ref 4.0–10.5)
nRBC: 0.5 % — ABNORMAL HIGH (ref 0.0–0.2)

## 2021-12-20 MED ORDER — HYDROMORPHONE HCL 2 MG/ML IJ SOLN
2.0000 mg | Freq: Once | INTRAMUSCULAR | Status: AC
  Administered 2021-12-20: 2 mg via INTRAVENOUS
  Filled 2021-12-20: qty 1

## 2021-12-20 MED ORDER — SODIUM CHLORIDE 0.9 % IV SOLN
12.5000 mg | Freq: Once | INTRAVENOUS | Status: DC
  Filled 2021-12-20: qty 0.5

## 2021-12-20 NOTE — TOC Progression Note (Signed)
Transition of Care [facility]) - Progression Note    Patient Details  Name: Stephanie Burch MRN: [id number] Date of Birth: August 07, 1980  Transition of Care [facility]) CM/SW Contact  Beckie Busing, RN Phone Number:[phone]  12/20/2021, 8:33 AM  Clinical Narrative:     Transition of Care (TOC) Screening Note   Patient Details  Name: Stephanie Burch Date of Birth: 1980-06-04   Transition of Care [facility]) CM/SW Contact:    Beckie Busing, RN Phone Number: 12/20/2021, 8:33 AM    Transition of Care Department [facility]) has reviewed patient and no TOC needs have been identified at this time. We will continue to monitor patient advancement through interdisciplinary progression rounds. If new patient transition needs arise, please place a TOC consult.          Expected Discharge Plan and Services                                                 Social Determinants of Health (SDOH) Interventions    Readmission Risk Interventions    12/19/2020   12:15 PM 08/14/2020   10:14 AM 07/17/2020   11:13 AM  Readmission Risk Prevention Plan  Transportation Screening Complete Complete Complete  PCP or Specialist Appt within 3-5 Days  Complete   HRI or Home Care Consult  Complete Complete  Social Work Consult for Recovery Care Planning/Counseling  Complete Complete  Palliative Care Screening  Not Applicable   Medication Review [employer]) Complete Complete Complete  HRI or Home Care Consult Complete    SW Recovery Care/Counseling Consult Complete    Palliative Care Screening Not Applicable    Skilled Nursing Facility Not Applicable

## 2021-12-20 NOTE — Discharge Summary (Signed)
Physician Discharge Summary  Stephanie Burch ZOX:[id number] DOB: 1980/02/27 DOA: 12/19/2021  PCP: Pcp, No  Admit date: 12/19/2021  Discharge date: 12/20/2021  Discharge Diagnoses:  Principal Problem:   Sickle cell pain crisis (HCC) Active Problems:   BMI 50.0-59.9, adult (HCC)   Chronic pain syndrome   Discharge Condition: Stable  Disposition:  Pt is discharged home in good condition and is to follow up with Pcp, No this week to have labs evaluated. Stephanie Burch is instructed to increase activity slowly and balance with rest for the next few days, and use prescribed medication to complete treatment of pain  Diet: Regular Wt Readings from Last 3 Encounters:  12/08/21 (!) 146.7 kg  11/25/21 (!) 151.4 kg  11/07/21 (!) 145.2 kg    History of present illness:   Stephanie Burch  is a 41 y.o. female is a 41 year old female with a medical history significant for sickle cell disease, chronic pain syndrome, opiate dependence and tolerance, and history of anemia of chronic disease presents to the emergency department with complaints of bilateral lower extremity pain.  Pain is consistent with her typical crisis.  Patient was treated and evaluated [facility] the [facility] for this problem on yesterday.  Pain intensity had improved upon discharge, but returned overnight.  She currently rates her pain as 10/10, constant, and throbbing.  Patient has been taking home oxycodone and Dilaudid without very much relief.  She denies any fever, chills, chest pain, or shortness of breath.  No urinary symptoms, nausea, vomiting, or diarrhea.  No sick contacts, recent travel, or known exposure to COVID-19.   ER course: While in ER, vital signs remained stable.BP 125/83   Pulse 83   Temp 98.7 F (37.1 C) (Oral)   Resp 18   LMP 10/31/2021 (Exact Date)   SpO2 99%  CBC notable for hemoglobin 11.9, consistent with patient's baseline.  CBC is otherwise unremarkable.  Complete metabolic panel  unremarkable.  Patient's pain persists despite IV Dilaudid, IV fluids, and IV Toradol.  Patient will be admitted in observation for further management of her pain crisis.  Hospital Course:  Sickle cell disease with pain crisis: Patient was admitted for sickle cell pain crisis and managed appropriately with IVF, IV Dilaudid via PCA and IV Toradol, as well as other adjunct therapies per sickle cell pain management protocols.  Patient presents frequently with sickle cell related pain.  Patient has chronic pain syndrome with frequent exacerbations.  Her labs are consistent with baseline.  Patient is very opiate tolerant and is on both oxycodone and Dilaudid at home.  She will resume these medications. Pain medications are managed by her hematology team at [facility].  She will follow-up as scheduled. Patient alert, oriented, and ambulating without assistance.  Patient was therefore discharged home today in a hemodynamically stable condition.   Stephanie ManKimaada will follow-up with PCP within 1 week of this discharge. Stephanie ManKimaada was counseled extensively about nonpharmacologic means of pain management   We discussed the need for good hydration, monitoring of hydration status, avoidance of heat, cold, stress, and infection triggers. We discussed the need to be adherent with taking Hydrea and other home medications. Patient was reminded of the need to seek medical attention immediately if any symptom of bleeding, anemia, or infection occurs.  Discharge Exam: Vitals:   12/20/21 0952 12/20/21 1025  BP: 116/74   Pulse: (!) 104   Resp: 20 16  Temp: 97.8 F (36.6 C)   SpO2: (!) 83% 92%   Vitals:  12/20/21 0213 12/20/21 0526 12/20/21 0952 12/20/21 1025  BP:  (!) 141/78 116/74   Pulse:  100 (!) 104   Resp: 16 17 20 16   Temp:  98.6 F (37 C) 97.8 F (36.6 C)   TempSrc:  Oral Oral   SpO2: 95% 91% (!) 83% 92%    General appearance : Awake, alert, not in any distress.  Morbidly obese. speech Clear. Not  toxic looking HEENT: Atraumatic and Normocephalic, pupils equally reactive to light and accomodation Neck: Supple, no JVD. No cervical lymphadenopathy.  Chest: Good air entry bilaterally, no added sounds  CVS: S1 S2 regular, no murmurs.  Abdomen: Bowel sounds present, Non tender and not distended with no gaurding, rigidity or rebound. Extremities: B/L Lower Ext shows no edema, both legs are warm to touch Neurology: Awake alert, and oriented X 3, CN II-XII intact, Non focal Skin: No Rash  Discharge Instructions  Discharge Instructions     Discharge patient   Complete by: As directed    Discharge disposition: 01-Home or Self Care   Discharge patient date: 12/20/2021      Allergies as of 12/20/2021   No Known Allergies      Medication List     TAKE these medications    acetaminophen 500 MG tablet Commonly known as: TYLENOL Take 1,000 mg by mouth every 6 (six) hours as needed for mild pain, moderate pain or headache.   folic acid 1 MG tablet Commonly known as: FOLVITE Take 1 mg by mouth daily.   HYDROmorphone 4 MG tablet Commonly known as: DILAUDID Take 4 mg by mouth every 6 (six) hours as needed for severe pain.   ibuprofen 200 MG tablet Commonly known as: ADVIL Take 800 mg by mouth 3 (three) times daily as needed for fever, headache or mild pain.   Oxbryta 500 MG Tabs tablet Generic drug: voxelotor Take 1,500 mg by mouth daily in the afternoon.   oxycodone 30 MG immediate release tablet Commonly known as: ROXICODONE Take 30 mg by mouth every 4 (four) hours as needed for pain.        The results of significant diagnostics from this hospitalization (including imaging, microbiology, ancillary and laboratory) are listed below for reference.    Significant Diagnostic Studies: DG Chest Portable 1 View  Result Date: 12/07/2021 CLINICAL DATA:  Sickle cell anemia, pain EXAM: PORTABLE CHEST 1 VIEW COMPARISON:  11/21/2021 FINDINGS: Mild bilateral lower lobe  atelectasis. No pleural effusion or pneumothorax. The heart is top-normal in size. IMPRESSION: Mild bilateral lower lobe atelectasis. Electronically Signed   By: 11/23/2021 M.D.   On: 12/07/2021 19:25   ECHOCARDIOGRAM COMPLETE  Result Date: 11/22/2021    ECHOCARDIOGRAM REPORT   Patient Name:   [facility] Date of Exam: 11/22/2021 Medical Rec #:  11/24/2021     Height:       65.0 in Accession #:    [id number]    Weight:       320.0 lb Date of Birth:  1980-07-15    BSA:          2.415 m Patient Age:    40 years      BP:           134/62 mmHg Patient Gender: F             HR:           82 bpm. Exam Location:  Inpatient Procedure: 2D Echo, Color Doppler and Cardiac Doppler Indications:    Pulmonary Hypertension  History:        Patient has prior history of Echocardiogram examinations, most                 recent 11/15/2019. Arrythmias:Tachycardia.  Sonographer:    Gaynell Face Referring Phys: [id number] ALEXANDER B MELVIN IMPRESSIONS  1. Left ventricular ejection fraction, by estimation, is 60 to 65%. The left ventricle has normal function. The left ventricle has no regional wall motion abnormalities. Left ventricular diastolic parameters are indeterminate.  2. Right ventricular systolic function is normal. The right ventricular size is normal.  3. The mitral valve is normal in structure. Trivial mitral valve regurgitation.  4. The aortic valve is tricuspid. Aortic valve regurgitation is not visualized. Aortic valve sclerosis/calcification is present, without any evidence of aortic stenosis. FINDINGS  Left Ventricle: Left ventricular ejection fraction, by estimation, is 60 to 65%. The left ventricle has normal function. The left ventricle has no regional wall motion abnormalities. The left ventricular internal cavity size was normal in size. There is  no left ventricular hypertrophy. Left ventricular diastolic parameters are indeterminate. Right Ventricle: The right ventricular size is normal. No increase  in right ventricular wall thickness. Right ventricular systolic function is normal. Left Atrium: Left atrial size was normal in size. Right Atrium: Right atrial size was normal in size. Pericardium: There is no evidence of pericardial effusion. Mitral Valve: The mitral valve is normal in structure. Trivial mitral valve regurgitation. Tricuspid Valve: The tricuspid valve is normal in structure. Tricuspid valve regurgitation is trivial. Aortic Valve: The aortic valve is tricuspid. Aortic valve regurgitation is not visualized. Aortic valve sclerosis/calcification is present, without any evidence of aortic stenosis. Aortic valve mean gradient measures 5.0 mmHg. Aortic valve peak gradient measures 9.6 mmHg. Aortic valve area, by VTI measures 4.32 cm. Pulmonic Valve: The pulmonic valve was not well visualized. Pulmonic valve regurgitation is not visualized. No evidence of pulmonic stenosis. Aorta: The aortic root is normal in size and structure. IAS/Shunts: No atrial level shunt detected by color flow Doppler.  LEFT VENTRICLE PLAX 2D LVIDd:         4.80 cm   Diastology LVIDs:         3.10 cm   LV e' medial:    6.09 cm/s LV PW:         1.10 cm   LV E/e' medial:  17.4 LV IVS:        1.00 cm   LV e' lateral:   5.66 cm/s LVOT diam:     2.80 cm   LV E/e' lateral: 18.7 LV SV:         106 LV SV Index:   44 LVOT Area:     6.16 cm  RIGHT VENTRICLE TAPSE (M-mode): 2.3 cm LEFT ATRIUM             Index        RIGHT ATRIUM           Index LA diam:        4.00 cm 1.66 cm/m   RA Area:     12.00 cm LA Vol (A2C):   76.7 ml 31.75 ml/m  RA Volume:   24.30 ml  10.06 ml/m LA Vol (A4C):   57.4 ml 23.76 ml/m LA Biplane Vol: 67.7 ml 28.03 ml/m  AORTIC VALVE AV Area (Vmax):    3.96 cm AV Area (Vmean):   3.99 cm AV Area (VTI):     4.32 cm AV Vmax:  155.00 cm/s AV Vmean:          97.700 cm/s AV VTI:            0.245 m AV Peak Grad:      9.6 mmHg AV Mean Grad:      5.0 mmHg LVOT Vmax:         99.70 cm/s LVOT Vmean:        63.300  cm/s LVOT VTI:          0.172 m LVOT/AV VTI ratio: 0.70  AORTA Ao Root diam: 3.50 cm Ao Asc diam:  3.00 cm MITRAL VALVE MV Area (PHT): 4.06 cm     SHUNTS MV Decel Time: 187 msec     Systemic VTI:  0.17 m MV E velocity: 106.00 cm/s  Systemic Diam: 2.80 cm MV A velocity: 108.00 cm/s MV E/A ratio:  0.98 Dietrich Pates MD Electronically signed by Dietrich Pates MD Signature Date/Time: 11/22/2021/4:03:46 PM    Final    DG Chest Portable 1 View  Result Date: 11/21/2021 CLINICAL DATA:  Chest pain EXAM: PORTABLE CHEST 1 VIEW COMPARISON:  04/02/21 FINDINGS: No pleural effusion. No pneumothorax. Cardiomegaly. No focal airspace opacity. Prominent interstitial opacities, could be seen in the setting of pulmonary venous congestion without overt pulmonary edema. No displaced rib fractures. Visualized upper abdomen is unremarkable. IMPRESSION: 1. No radiographic finding to explain chest pain. 2. Cardiomegaly with pulmonary venous congestion. No overt pulmonary edema. No focal airspace opacity. Electronically Signed   By: Lorenza Cambridge M.D.   On: 11/21/2021 16:13   CT Angio Chest PE W and/or Wo Contrast  Result Date: 11/21/2021 CLINICAL DATA:  Chest pain, pulmonary embolism suspected. EXAM: CT ANGIOGRAPHY CHEST WITH CONTRAST TECHNIQUE: Multidetector CT imaging of the chest was performed using the standard protocol during bolus administration of intravenous contrast. Multiplanar CT image reconstructions and MIPs were obtained to evaluate the vascular anatomy. RADIATION DOSE REDUCTION: This exam was performed according to the departmental dose-optimization program which includes automated exposure control, adjustment of the mA and/or kV according to patient size and/or use of iterative reconstruction technique. CONTRAST:  OMNIPAQUE IOHEXOL 350 MG/ML SOLN COMPARISON:  CT examination dated April 02, 2021 FINDINGS: Cardiovascular: Satisfactory opacification of the pulmonary arteries to the segmental level. No evidence of  pulmonary embolism. Evaluation of distal segmental and subsegmental pulmonary arteries is limited due to phase of contrast enhancement and respiratory motion. Heart is mildly enlarged. Main pulmonary trunk is dilated measuring up to 3.2 cm concerning for pulmonary arterial hypertension. No pericardial effusion. Mediastinum/Nodes: No enlarged mediastinal, hilar, or axillary lymph nodes. Thyroid gland, trachea, and esophagus demonstrate no significant findings. Lungs/Pleura: No focal consolidation or pleural effusion. Low lung volumes. Bibasilar dependent atelectasis. 6 mm nodule in the superior aspect of the right middle lobe (series 6, image 58). Upper Abdomen: No acute abnormality. Musculoskeletal: No chest wall abnormality. No acute or significant osseous findings. Review of the MIP images confirms the above findings. IMPRESSION: 1. No evidence of central pulmonary embolism. Evaluation of distal segmental and subsegmental pulmonary arteries is limited due to phase of contrast enhancement and respiratory motion. 2. Mild cardiomegaly. 3. Main pulmonary trunk is dilated measuring up to 3.2 cm concerning for pulmonary arterial hypertension. 4. Low lung volumes with bibasilar dependent atelectasis. No focal consolidation or pleural effusion. 5. 6 mm nodule in the superior aspect of the right middle lobe, unchanged. Electronically Signed   By: Larose Hires D.O.   On: 11/21/2021 15:44    Microbiology: No  results found for this or any previous visit (from the past 240 hour(s)).   Labs: Basic Metabolic Panel: Recent Labs  Lab 12/18/21 1005  NA 139  K 4.1  CL 110  CO2 24  GLUCOSE 124*  BUN 7  CREATININE 0.59  CALCIUM 8.7*   Liver Function Tests: Recent Labs  Lab 12/18/21 1005  AST 15  ALT 20  ALKPHOS 69  BILITOT 0.8  PROT 7.6  ALBUMIN 3.7   No results for input(s): "LIPASE", "AMYLASE" in the last 168 hours. No results for input(s): "AMMONIA" in the last 168 hours. CBC: Recent Labs  Lab  12/18/21 1005 12/20/21 0538  WBC 8.5 11.5*  NEUTROABS 5.3  --   HGB 11.9* 10.9*  HCT 34.8* 32.4*  MCV 71.8* 72.5*  PLT 270 240   Cardiac Enzymes: No results for input(s): "CKTOTAL", "CKMB", "CKMBINDEX", "TROPONINI" in the last 168 hours. BNP: Invalid input(s): "POCBNP" CBG: No results for input(s): "GLUCAP" in the last 168 hours.  Time coordinating discharge: 30 minutes  Signed:  Nolon Nations  APRN, MSN, FNP-C [facility] [address] [place], [place] [address] [phone]  [facility] 12/20/2021, 12:08 PM

## 2021-12-22 ENCOUNTER — Other Ambulatory Visit: Payer: Self-pay

## 2021-12-22 ENCOUNTER — Emergency Department (HOSPITAL_COMMUNITY)
Admission: EM | Admit: 2021-12-22 | Discharge: 2021-12-22 | Disposition: A | Discharge status: Home / Self Care | Payer: Medicare Other | Attending: Emergency Medicine | Admitting: Emergency Medicine

## 2021-12-22 DIAGNOSIS — D57219 Sickle-cell/Hb-C disease with crisis, unspecified: Secondary | ICD-10-CM | POA: Diagnosis not present

## 2021-12-22 DIAGNOSIS — D5700 Hb-SS disease with crisis, unspecified: Secondary | ICD-10-CM

## 2021-12-22 DIAGNOSIS — G894 Chronic pain syndrome: Secondary | ICD-10-CM | POA: Insufficient documentation

## 2021-12-22 LAB — CBC
HCT: 37.9 % (ref 36.0–46.0)
Hemoglobin: 13.1 g/dL (ref 12.0–15.0)
MCH: 24.6 pg — ABNORMAL LOW (ref 26.0–34.0)
MCHC: 34.6 g/dL (ref 30.0–36.0)
MCV: 71.2 fL — ABNORMAL LOW (ref 80.0–100.0)
Platelets: 316 10*3/uL (ref 150–400)
RBC: 5.32 MIL/uL — ABNORMAL HIGH (ref 3.87–5.11)
RDW: 15.2 % (ref 11.5–15.5)
WBC: 10.9 10*3/uL — ABNORMAL HIGH (ref 4.0–10.5)
nRBC: 1.6 % — ABNORMAL HIGH (ref 0.0–0.2)

## 2021-12-22 LAB — COMPREHENSIVE METABOLIC PANEL
ALT: 14 U/L (ref 0–44)
AST: 12 U/L — ABNORMAL LOW (ref 15–41)
Albumin: 3.9 g/dL (ref 3.5–5.0)
Alkaline Phosphatase: 71 U/L (ref 38–126)
Anion gap: 6 (ref 5–15)
BUN: 6 mg/dL (ref 6–20)
CO2: 24 mmol/L (ref 22–32)
Calcium: 9.4 mg/dL (ref 8.9–10.3)
Chloride: 110 mmol/L (ref 98–111)
Creatinine, Ser: 0.56 mg/dL (ref 0.44–1.00)
GFR, Estimated: >60 mL/min (ref >60)
Glucose, Bld: 109 mg/dL — ABNORMAL HIGH (ref 70–99)
Potassium: 3.6 mmol/L (ref 3.5–5.1)
Sodium: 140 mmol/L (ref 135–145)
Total Bilirubin: 1 mg/dL (ref 0.3–1.2)
Total Protein: 8 g/dL (ref 6.5–8.1)

## 2021-12-22 LAB — RETICULOCYTES
Immature Retic Fract: 38.1 % — ABNORMAL HIGH (ref 2.3–15.9)
RBC.: 5.3 MIL/uL — ABNORMAL HIGH (ref 3.87–5.11)
Retic Count, Absolute: 215.7 10*3/uL — ABNORMAL HIGH (ref 19.0–186.0)
Retic Ct Pct: 4.1 % — ABNORMAL HIGH (ref 0.4–3.1)

## 2021-12-22 MED ORDER — ONDANSETRON HCL 4 MG/2ML IJ SOLN
4.0000 mg | Freq: Once | INTRAMUSCULAR | Status: AC
  Administered 2021-12-22: 4 mg via INTRAVENOUS
  Filled 2021-12-22: qty 2

## 2021-12-22 MED ORDER — KETOROLAC TROMETHAMINE 15 MG/ML IJ SOLN
15.0000 mg | Freq: Once | INTRAMUSCULAR | Status: AC
  Administered 2021-12-22: 15 mg via INTRAVENOUS
  Filled 2021-12-22: qty 1

## 2021-12-22 MED ORDER — HYDROMORPHONE HCL 1 MG/ML IJ SOLN
1.0000 mg | Freq: Once | INTRAMUSCULAR | Status: AC
  Administered 2021-12-22: 1 mg via INTRAVENOUS
  Filled 2021-12-22: qty 1

## 2021-12-22 MED ORDER — ACETAMINOPHEN 500 MG PO TABS
1000.0000 mg | ORAL_TABLET | Freq: Once | ORAL | Status: AC
  Administered 2021-12-22: 1000 mg via ORAL
  Filled 2021-12-22: qty 2

## 2021-12-22 NOTE — Discharge Instructions (Addendum)
It was our pleasure to provide your ER care today - we hope that you feel better.  Drink plenty of fluids/stay well hydrated.   Take your pain medication as need.   Follow up closely with your doctor in the next 1-2 days.  Return to ER if worse, new symptoms, fevers, trouble breathing, chest pain, or other emergency concern.  You were given pain meds in the ER - no driving for the next 8 hours, or if/when taking opiate type pain meds.

## 2021-12-22 NOTE — ED Triage Notes (Signed)
C/o SCC with pain to bilateral legs x1 week.  Pt reports ran out of home meds this am.  Denies cp, sob.

## 2021-12-22 NOTE — ED Provider Notes (Signed)
[facility]-EMERGENCY DEPT Provider Note   CSN: [id number] Arrival date & time: 12/22/21  0930     History {Add pertinent medical, surgical, social history, OB history to HPI:1} Chief Complaint  Patient presents with   Sickle Cell Pain Crisis    Stephanie Burch is a 41 y.o. female.  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 States she has had the same pain related to her sickle cell recurrently for long time - currently symptoms increased in past couple days. States is taking her home meds with mild relief (records indicates pt had indicated was recently out of her meds, and that new rx was sent and available tomorrow AM).  Pt denies fever, chills or sweats. No chest pain or discomfort. No sob or unusual doe. No cough or uri symptoms. No dysuria or gu c/o. No abd pain or nvd. No extremity swelling. No neck, back or radicular pain.   The history is provided by the patient and medical records.  Sickle Cell Pain Crisis Associated symptoms: no chest pain, no cough, no fever, no headaches, no shortness of breath, no sore throat and no vomiting        Home Medications Prior to Admission medications   Medication Sig Start Date End Date Taking? Authorizing Provider  acetaminophen (TYLENOL) 500 MG tablet Take 1,000 mg by mouth every 6 (six) hours as needed for mild pain, moderate pain or headache.    [provider]  folic acid (FOLVITE) 1 MG tablet Take 1 mg by mouth daily.    [provider]  HYDROmorphone (DILAUDID) 4 MG tablet Take 4 mg by mouth every 6 (six) hours as needed for severe pain.    [provider]  ibuprofen (ADVIL) 200 MG tablet Take 800 mg by mouth 3 (three) times daily as needed for fever, headache or mild pain.    [provider]  OXBRYTA 500 MG TABS tablet Take 1,500 mg by mouth daily in the afternoon. 10/04/21   [provider]   oxycodone (ROXICODONE) 30 MG immediate release tablet Take 30 mg by mouth every 4 (four) hours as needed for pain.    [provider]      Allergies    Patient has no known allergies.    Review of Systems   Review of Systems  Constitutional:  Negative for chills and fever.  HENT:  Negative for sore throat.   Eyes:  Negative for redness.  Respiratory:  Negative for cough and shortness of breath.   Cardiovascular:  Negative for chest pain.  Gastrointestinal:  Negative for abdominal pain, diarrhea and vomiting.  Genitourinary:  Negative for dysuria and flank pain.  Musculoskeletal:  Negative for back pain and neck pain.  Skin:  Negative for rash.  Neurological:  Negative for weakness, numbness and headaches.  Hematological:  Does not bruise/bleed easily.  Psychiatric/Behavioral:  Negative for confusion.     Physical Exam Updated Vital Signs BP 134/83   Pulse 100   Temp 98.3 F (36.8 C) (Oral)   Resp 18   LMP 10/31/2021 (Exact Date)   SpO2 99%  Physical Exam Vitals and nursing note reviewed.  Constitutional:      Appearance: Normal appearance. She is well-developed.  HENT:     Head: Atraumatic.     Nose: Nose normal.     Mouth/Throat:     Mouth: Mucous membranes are moist.     Pharynx: Oropharynx is clear.  Eyes:     General: No scleral icterus.    Conjunctiva/sclera: Conjunctivae normal.     Pupils: Pupils are equal, round, and reactive to light.  Neck:     Trachea: No tracheal deviation.  Cardiovascular:     Rate and Rhythm: Normal rate and regular rhythm.     Pulses: Normal pulses.     Heart sounds: Normal heart sounds. No murmur heard.    No friction rub. No gallop.  Pulmonary:     Effort: Pulmonary effort is normal. No respiratory distress.     Breath sounds: Normal breath sounds.  Abdominal:     General: Bowel sounds are normal. There is no distension.     Palpations: Abdomen is soft.     Tenderness: There is no abdominal tenderness.   Genitourinary:    Comments: No cva tenderness.  Musculoskeletal:        General: No swelling or tenderness.     Cervical back: Normal range of motion and neck supple. No rigidity. No muscular tenderness.     Comments: Good rom bil lower extremities without pain. No lower extremity swelling. Legs are of normal color and warmth, intact distal pulses.   Skin:    General: Skin is warm and dry.     Findings: No rash.  Neurological:     Mental Status: She is alert.     Comments: Alert, speech normal. Motor/sens grossly intact. Steady gait.   Psychiatric:        Mood and Affect: Mood normal.     ED Results / Procedures / Treatments   Labs (all labs ordered are listed, but only abnormal results are displayed) Results for orders placed or performed during the hospital encounter of 12/22/21  Comprehensive metabolic panel  Result Value Ref Range   Sodium 140 135 - 145 mmol/L   Potassium 3.6 3.5 - 5.1 mmol/L   Chloride 110 98 - 111 mmol/L   CO2 24 22 - 32 mmol/L   Glucose, Bld 109 (H) 70 - 99 mg/dL   BUN 6 6 - 20 mg/dL   Creatinine, Ser 2.59 0.44 - 1.00 mg/dL   Calcium 9.4 8.9 - 56.3 mg/dL   Total Protein 8.0 6.5 - 8.1 g/dL   Albumin 3.9 3.5 - 5.0 g/dL   AST 12 (L) 15 - 41 U/L   ALT 14 0 - 44 U/L   Alkaline Phosphatase 71 38 - 126 U/L   Total Bilirubin 1.0 0.3 - 1.2 mg/dL   GFR, Estimated >87 >56 mL/min   Anion gap 6 5 - 15  CBC  Result Value Ref Range   WBC 10.9 (H) 4.0 - 10.5 K/uL   RBC 5.32 (H) 3.87 - 5.11 MIL/uL   Hemoglobin 13.1 12.0 - 15.0 g/dL   HCT 43.3 29.5 - 18.8 %   MCV 71.2 (L) 80.0 - 100.0 fL   MCH 24.6 (L) 26.0 - 34.0 pg   MCHC 34.6 30.0 - 36.0 g/dL   RDW 41.6 60.6 - 30.1 %   Platelets 316 150 - 400 K/uL   nRBC 1.6 (H) 0.0 - 0.2 %  Reticulocytes  Result Value Ref Range   Retic Ct Pct 4.1 (H) 0.4 - 3.1 %   RBC. 5.30 (H) 3.87 - 5.11 MIL/uL   Retic Count, Absolute 215.7 (H) 19.0 - 186.0 K/uL   Immature Retic Fract 38.1 (H) 2.3 - 15.9 %   DG Chest Portable 1  View  Result Date: 12/07/2021 CLINICAL DATA:  Sickle cell anemia, pain  EXAM: PORTABLE CHEST 1 VIEW COMPARISON:  11/21/2021 FINDINGS: Mild bilateral lower lobe atelectasis. No pleural effusion or pneumothorax. The heart is top-normal in size. IMPRESSION: Mild bilateral lower lobe atelectasis. Electronically Signed   By: Charline BillsSriyesh  Krishnan M.D.   On: 12/07/2021 19:25   ECHOCARDIOGRAM COMPLETE  Result Date: 11/22/2021    ECHOCARDIOGRAM REPORT   [facility] Date of Exam: 11/22/2021 Medical Rec #:  [id number]     Height:       65.0 in Accession #:    [id number]    Weight:       320.0 lb Date of Birth:  October 26, 1980    BSA:          2.415 m Patient Age:    40 years      BP:           134/62 mmHg Patient Gender: F             HR:           82 bpm. Exam Location:  Inpatient Procedure: 2D Echo, Color Doppler and Cardiac Doppler Indications:    Pulmonary Hypertension  History:        Patient has prior history of Echocardiogram examinations, most                 recent 11/15/2019. Arrythmias:Tachycardia.  Sonographer:    Gaynell FaceEllisa Machuca Referring Phys: [id number] ALEXANDER B MELVIN IMPRESSIONS  1. Left ventricular ejection fraction, by estimation, is 60 to 65%. The left ventricle has normal function. The left ventricle has no regional wall motion abnormalities. Left ventricular diastolic parameters are indeterminate.  2. Right ventricular systolic function is normal. The right ventricular size is normal.  3. The mitral valve is normal in structure. Trivial mitral valve regurgitation.  4. The aortic valve is tricuspid. Aortic valve regurgitation is not visualized. Aortic valve sclerosis/calcification is present, without any evidence of aortic stenosis. FINDINGS  Left Ventricle: Left ventricular ejection fraction, by estimation, is 60 to 65%. The left ventricle has normal function. The left ventricle has no regional wall motion abnormalities. The left ventricular internal cavity size was normal in size. There  is  no left ventricular hypertrophy. Left ventricular diastolic parameters are indeterminate. Right Ventricle: The right ventricular size is normal. No increase in right ventricular wall thickness. Right ventricular systolic function is normal. Left Atrium: Left atrial size was normal in size. Right Atrium: Right atrial size was normal in size. Pericardium: There is no evidence of pericardial effusion. Mitral Valve: The mitral valve is normal in structure. Trivial mitral valve regurgitation. Tricuspid Valve: The tricuspid valve is normal in structure. Tricuspid valve regurgitation is trivial. Aortic Valve: The aortic valve is tricuspid. Aortic valve regurgitation is not visualized. Aortic valve sclerosis/calcification is present, without any evidence of aortic stenosis. Aortic valve mean gradient measures 5.0 mmHg. Aortic valve peak gradient measures 9.6 mmHg. Aortic valve area, by VTI measures 4.32 cm. Pulmonic Valve: The pulmonic valve was not well visualized. Pulmonic valve regurgitation is not visualized. No evidence of pulmonic stenosis. Aorta: The aortic root is normal in size and structure. IAS/Shunts: No atrial level shunt detected by color flow Doppler.  LEFT VENTRICLE PLAX 2D LVIDd:         4.80 cm   Diastology LVIDs:         3.10 cm   LV e' medial:    6.09 cm/s LV PW:         1.10 cm   LV E/e'  medial:  17.4 LV IVS:        1.00 cm   LV e' lateral:   5.66 cm/s LVOT diam:     2.80 cm   LV E/e' lateral: 18.7 LV SV:         106 LV SV Index:   44 LVOT Area:     6.16 cm  RIGHT VENTRICLE TAPSE (M-mode): 2.3 cm LEFT ATRIUM             Index        RIGHT ATRIUM           Index LA diam:        4.00 cm 1.66 cm/m   RA Area:     12.00 cm LA Vol (A2C):   76.7 ml 31.75 ml/m  RA Volume:   24.30 ml  10.06 ml/m LA Vol (A4C):   57.4 ml 23.76 ml/m LA Biplane Vol: 67.7 ml 28.03 ml/m  AORTIC VALVE AV Area (Vmax):    3.96 cm AV Area (Vmean):   3.99 cm AV Area (VTI):     4.32 cm AV Vmax:           155.00 cm/s AV Vmean:           97.700 cm/s AV VTI:            0.245 m AV Peak Grad:      9.6 mmHg AV Mean Grad:      5.0 mmHg LVOT Vmax:         99.70 cm/s LVOT Vmean:        63.300 cm/s LVOT VTI:          0.172 m LVOT/AV VTI ratio: 0.70  AORTA Ao Root diam: 3.50 cm Ao Asc diam:  3.00 cm MITRAL VALVE MV Area (PHT): 4.06 cm     SHUNTS MV Decel Time: 187 msec     Systemic VTI:  0.17 m MV E velocity: 106.00 cm/s  Systemic Diam: 2.80 cm MV A velocity: 108.00 cm/s MV E/A ratio:  0.98 Dietrich Pates MD Electronically signed by Dietrich Pates MD Signature Date/Time: 11/22/2021/4:03:46 PM    Final     EKG None  Radiology No results found.  Procedures Procedures  {Document cardiac monitor, telemetry assessment procedure when appropriate:1}  Medications Ordered in ED Medications  HYDROmorphone (DILAUDID) injection 1 mg (has no administration in time range)    ED Course/ Medical Decision Making/ A&P                           Medical Decision Making Problems Addressed: Chronic pain syndrome: chronic illness or injury with exacerbation, progression, or side effects of treatment Sickle cell anemia with pain [facility]): acute illness or injury with systemic symptoms that poses a threat to life or bodily functions    Details: Acute/chronic  Amount and/or Complexity of Data Reviewed External Data Reviewed: labs and notes. Labs: ordered. Decision-making details documented in ED Course.  Risk OTC drugs. Prescription drug management. Decision regarding hospitalization.   Iv ns. Continuous pulse ox and cardiac monitoring. Labs ordered/sent.   Diff dx includes chronic pain syndrome, sickle cell pain crisis, etc - dispo decision including potential need for admission considered if acute drop hgb, aki, etc - will get labs and reassess.   Reviewed nursing notes and prior charts for additional history. External reports reviewed.   Cardiac monitor: sinus rhythm, rate 88.  Labs reviewed/interpreted by me - hgb c/w prior. Hgb  13.  Dilaudid iv.   Recheck, pt appears comfortable, no acute distress.  Pt requests additional pain med. Dilaudid iv, toradol iv, zofran iv, and acetaminophen po.   Pt appears comfortable and in no acute distress - pt currently appears stable for d/c.   Rec close f/u with her primary care doctor.   Return precautions provided.      {Document critical care time when appropriate:1} {Document review of labs and clinical decision tools ie heart score, Chads2Vasc2 etc:1}  {Document your independent review of radiology images, and any outside records:1} {Document your discussion with family members, caretakers, and with consultants:1} {Document social determinants of health affecting pt's care:1} {Document your decision making why or why not admission, treatments were needed:1} Final Clinical Impression(s) / ED Diagnoses Final diagnoses:  None    Rx / DC Orders ED Discharge Orders     None

## 2021-12-23 ENCOUNTER — Emergency Department (HOSPITAL_COMMUNITY)
Admission: EM | Admit: 2021-12-23 | Discharge: 2021-12-23 | Disposition: A | Discharge status: Home / Self Care | Payer: Medicare Other | Attending: Emergency Medicine | Admitting: Emergency Medicine

## 2021-12-23 ENCOUNTER — Telehealth (HOSPITAL_COMMUNITY): Payer: Self-pay

## 2021-12-23 ENCOUNTER — Other Ambulatory Visit: Payer: Self-pay

## 2021-12-23 DIAGNOSIS — D5700 Hb-SS disease with crisis, unspecified: Secondary | ICD-10-CM | POA: Diagnosis present

## 2021-12-23 LAB — RETICULOCYTES
Immature Retic Fract: 33.7 % — ABNORMAL HIGH (ref 2.3–15.9)
RBC.: 5.69 MIL/uL — ABNORMAL HIGH (ref 3.87–5.11)
Retic Count, Absolute: 237.3 10*3/uL — ABNORMAL HIGH (ref 19.0–186.0)
Retic Ct Pct: 4.2 % — ABNORMAL HIGH (ref 0.4–3.1)

## 2021-12-23 LAB — CBC WITH DIFFERENTIAL/PLATELET
Abs Immature Granulocytes: 0.04 10*3/uL (ref 0.00–0.07)
Basophils Absolute: 0 10*3/uL (ref 0.0–0.1)
Basophils Relative: 0 %
Eosinophils Absolute: 0.1 10*3/uL (ref 0.0–0.5)
Eosinophils Relative: 1 %
HCT: 40.5 % (ref 36.0–46.0)
Hemoglobin: 13.9 g/dL (ref 12.0–15.0)
Immature Granulocytes: 0 %
Lymphocytes Relative: 23 %
Lymphs Abs: 2.5 10*3/uL (ref 0.7–4.0)
MCH: 24.5 pg — ABNORMAL LOW (ref 26.0–34.0)
MCHC: 34.3 g/dL (ref 30.0–36.0)
MCV: 71.4 fL — ABNORMAL LOW (ref 80.0–100.0)
Monocytes Absolute: 0.9 10*3/uL (ref 0.1–1.0)
Monocytes Relative: 8 %
Neutro Abs: 7.5 10*3/uL (ref 1.7–7.7)
Neutrophils Relative %: 68 %
Platelets: 367 10*3/uL (ref 150–400)
RBC: 5.67 MIL/uL — ABNORMAL HIGH (ref 3.87–5.11)
RDW: 15.2 % (ref 11.5–15.5)
WBC: 11.1 10*3/uL — ABNORMAL HIGH (ref 4.0–10.5)
nRBC: 0.9 % — ABNORMAL HIGH (ref 0.0–0.2)

## 2021-12-23 LAB — COMPREHENSIVE METABOLIC PANEL
ALT: 13 U/L (ref 0–44)
AST: 13 U/L — ABNORMAL LOW (ref 15–41)
Albumin: 4.1 g/dL (ref 3.5–5.0)
Alkaline Phosphatase: 74 U/L (ref 38–126)
Anion gap: 8 (ref 5–15)
BUN: 8 mg/dL (ref 6–20)
CO2: 25 mmol/L (ref 22–32)
Calcium: 9.6 mg/dL (ref 8.9–10.3)
Chloride: 110 mmol/L (ref 98–111)
Creatinine, Ser: 0.73 mg/dL (ref 0.44–1.00)
GFR, Estimated: >60 mL/min (ref >60)
Glucose, Bld: 107 mg/dL — ABNORMAL HIGH (ref 70–99)
Potassium: 4 mmol/L (ref 3.5–5.1)
Sodium: 143 mmol/L (ref 135–145)
Total Bilirubin: 0.9 mg/dL (ref 0.3–1.2)
Total Protein: 8.4 g/dL — ABNORMAL HIGH (ref 6.5–8.1)

## 2021-12-23 MED ORDER — HYDROMORPHONE HCL 2 MG/ML IJ SOLN
2.0000 mg | INTRAMUSCULAR | Status: AC
  Administered 2021-12-23: 2 mg via SUBCUTANEOUS
  Filled 2021-12-23: qty 1

## 2021-12-23 MED ORDER — ONDANSETRON 4 MG PO TBDP
ORAL_TABLET | ORAL | Status: AC
  Filled 2021-12-23: qty 1

## 2021-12-23 MED ORDER — ONDANSETRON 4 MG PO TBDP
4.0000 mg | ORAL_TABLET | Freq: Once | ORAL | Status: AC
  Administered 2021-12-23: 4 mg via ORAL

## 2021-12-23 MED ORDER — SODIUM CHLORIDE 0.45 % IV SOLN: INTRAVENOUS | Status: DC

## 2021-12-23 MED ORDER — HYDROMORPHONE HCL 2 MG/ML IJ SOLN
2.0000 mg | INTRAMUSCULAR | Status: AC
  Administered 2021-12-23: 2 mg via INTRAVENOUS
  Filled 2021-12-23: qty 1

## 2021-12-23 MED ORDER — HYDROMORPHONE HCL 2 MG/ML IJ SOLN: 2.0000 mg | INTRAMUSCULAR | Status: DC

## 2021-12-23 MED ORDER — KETOROLAC TROMETHAMINE 15 MG/ML IJ SOLN
15.0000 mg | INTRAMUSCULAR | Status: AC
  Administered 2021-12-23: 15 mg via INTRAMUSCULAR
  Filled 2021-12-23: qty 1

## 2021-12-23 NOTE — ED Triage Notes (Signed)
Patient c/o sickle cell pain in both legs x 1 1/2 weeks. Patient states she was seen yesterday for the same and was told to go to the [facility] today. Patient states she went and they are full.

## 2021-12-23 NOTE — ED Notes (Signed)
Pt stated that she needs an IV stick for labs.

## 2021-12-23 NOTE — Discharge Instructions (Addendum)
You were seen in the emergency department for your sickle cell pain.  Your labs are stable and show no signs of worsening anemia.  We were able to have your pain controlled in the emergency department with 3 doses of pain medication.  You should follow-up with your hematologist next week as planned and let them know that you have been having increasing pain to see if they may want to see you in the office sooner.  You should return to the emergency department if you are having fevers, chest pain, shortness of breath or if you have any other new or concerning symptoms.

## 2021-12-23 NOTE — ED Provider Triage Note (Signed)
Emergency Medicine Provider Triage Evaluation Note  Stephanie Burch , a 41 y.o. female  was evaluated in triage.  Pt complains of sickle cell [place].  Seen yesterday for pain crisis in bilateral lower extremities patient states "the doctor just dismissed me and told me to follow-up with sickle cell pain clinic today."  She states the pain clinic was full and her pain has been unrelieved for the past 24 hours.  She states "I never had my pain last this long."  Review of Systems  Positive: Leg pain Negative: Fever/ cp  Physical Exam  BP (!) 148/97 (BP Location: Right Arm)   Pulse 98   Temp 99.1 F (37.3 C) (Oral)   Resp 16   LMP 10/31/2021 (Exact Date)   SpO2 98%  Gen:   Awake, no distress   Resp:  Normal effort  MSK:   Moves extremities without difficulty  Other:  tearful  Medical Decision Making  Medically screening exam initiated at 9:39 AM.  Appropriate orders placed.  Stephanie Burch was informed that the remainder of the evaluation will be completed by another provider, this initial triage assessment does not replace that evaluation, and the importance of remaining in the ED until their evaluation is complete.     Arthor Captain, PA-C 12/23/21 [phone]

## 2021-12-23 NOTE — ED Provider Notes (Signed)
[facility] DEPT Provider Note   CSN: [id number] Arrival date & time: 12/23/21  [id number]     History  Chief Complaint  Patient presents with   Sickle Cell Pain Crisis    Stephanie Burch is a 41 y.o. female.  Patient is a 41 year old female with a past medical history of sickle cell anemia presenting to the emergency department with pain crisis.  The patient states that she has been in a pain crisis for the last week in her bilateral legs.  She states that she normally takes oxycodone 30 mg and Dilaudid 4 mg at home for pain but ran out of her pain medications this weekend.  She was admitted to the hospital last week for pain crisis but reports she had no improvement of her pain at time of discharge.  She denies any fevers or chills, chest pain or cough, numbness or weakness, trauma or falls.  She states that she follows with [facility] hematology and has an appointment scheduled for next week.  The history is provided by the patient.  Sickle Cell Pain Crisis      Home Medications Prior to Admission medications   Medication Sig Start Date End Date Taking? Authorizing Provider  acetaminophen (TYLENOL) 500 MG tablet Take 1,000 mg by mouth every 6 (six) hours as needed for mild pain, moderate pain or headache.    [provider]  folic acid (FOLVITE) 1 MG tablet Take 1 mg by mouth daily.    [provider]  HYDROmorphone (DILAUDID) 4 MG tablet Take 4 mg by mouth every 6 (six) hours as needed for severe pain.    [provider]  ibuprofen (ADVIL) 200 MG tablet Take 800 mg by mouth 3 (three) times daily as needed for fever, headache or mild pain.    [provider]  OXBRYTA 500 MG TABS tablet Take 1,500 mg by mouth daily in the afternoon. 10/04/21   [provider]  oxycodone (ROXICODONE) 30 MG immediate release tablet Take 30 mg by mouth every 4 (four) hours as needed for pain.    [provider]       Allergies    Patient has no known allergies.    Review of Systems   Review of Systems  Physical Exam Updated Vital Signs BP (!) 130/92 (BP Location: Right Arm)   Pulse (!) 101   Temp 99.1 F (37.3 C) (Oral)   Resp 18   Ht 5\' 5"  (1.651 m)   Wt (!) 145.2 kg   LMP 12/23/2021 (Exact Date)   SpO2 92%   BMI 53.25 kg/m  Physical Exam Vitals and nursing note reviewed.  Constitutional:      Appearance: Normal appearance. She is not toxic-appearing.     Comments: Uncomfortable appearing tearful on exam  HENT:     Head: Normocephalic and atraumatic.     Nose: Nose normal.     Mouth/Throat:     Mouth: Mucous membranes are moist.     Pharynx: Oropharynx is clear.  Eyes:     Extraocular Movements: Extraocular movements intact.     Conjunctiva/sclera: Conjunctivae normal.  Cardiovascular:     Rate and Rhythm: Normal rate and regular rhythm.     Pulses: Normal pulses.     Heart sounds: Normal heart sounds.  Pulmonary:     Effort: Pulmonary effort is normal.     Breath sounds: Normal breath sounds.  Abdominal:     General: Abdomen is flat.  Palpations: Abdomen is soft.     Tenderness: There is no abdominal tenderness.  Musculoskeletal:        General: No swelling. Normal range of motion.     Cervical back: Normal range of motion and neck supple.     Right lower leg: No edema.     Left lower leg: No edema.     Comments: Diffuse tenderness to palpation of bilateral thighs, no point bony tenderness  Skin:    General: Skin is warm and dry.  Neurological:     General: No focal deficit present.     Mental Status: She is alert and oriented to person, place, and time.     Sensory: No sensory deficit.     Motor: No weakness.  Psychiatric:        Mood and Affect: Mood normal.        Behavior: Behavior normal.     ED Results / Procedures / Treatments   Labs (all labs ordered are listed, but only abnormal results are displayed) Labs Reviewed  CBC WITH  DIFFERENTIAL/PLATELET - Abnormal; Notable for the following components:      Result Value   WBC 11.1 (*)    RBC 5.67 (*)    MCV 71.4 (*)    MCH 24.5 (*)    nRBC 0.9 (*)    All other components within normal limits  RETICULOCYTES - Abnormal; Notable for the following components:   Retic Ct Pct 4.2 (*)    RBC. 5.69 (*)    Retic Count, Absolute 237.3 (*)    Immature Retic Fract 33.7 (*)    All other components within normal limits  COMPREHENSIVE METABOLIC PANEL - Abnormal; Notable for the following components:   Glucose, Bld 107 (*)    Total Protein 8.4 (*)    AST 13 (*)    All other components within normal limits  HCG, QUANTITATIVE, PREGNANCY    EKG None  Radiology No results found.  Procedures Procedures    Medications Ordered in ED Medications  0.45 % sodium chloride infusion ( Intravenous New Bag/Given 12/23/21 1244)  ketorolac (TORADOL) 15 MG/ML injection 15 mg (15 mg Intramuscular Given 12/23/21 1157)  HYDROmorphone (DILAUDID) injection 2 mg (2 mg Subcutaneous Given 12/23/21 1158)  HYDROmorphone (DILAUDID) injection 2 mg (2 mg Subcutaneous Given 12/23/21 1244)  ondansetron (ZOFRAN-ODT) disintegrating tablet 4 mg (4 mg Oral Given 12/23/21 1158)  HYDROmorphone (DILAUDID) injection 2 mg (2 mg Intravenous Given 12/23/21 1324)    ED Course/ Medical Decision Making/ A&P Clinical Course as of 12/23/21 1402  Mon Dec 23, 2021  1400 Upon reassessment, the patient's pain has significantly improved and she feels comfortable with discharge home.  She was recommended to continue her home medications as prescribed and to follow-up with her hematologist as planned next week.  She was given strict return precautions. [VK]    Clinical Course User Index [VK] Rexford Maus, DO                           Medical Decision Making This patient presents to the ED with chief complaint(s) of sickle cell pain crisis with pertinent past medical history of sickle cell anemia which  further complicates the presenting complaint. The complaint involves an extensive differential diagnosis and also carries with it a high risk of complications and morbidity.    The differential diagnosis includes chronic pain syndrome, sickle cell crisis, hemolytic anemia less likely as her hemoglobin has  been stable on labs the last several days, no history of trauma or falls and no obvious deformity making fracture dislocation unlikely  Additional history obtained: Additional history obtained from N/A Records reviewed previous admission documents  ED Course and Reassessment: Upon patient's arrival to the emergency department she is awake and alert, tearful and uncomfortable appearing but otherwise in no acute distress.  She has been seen multiple times over the last month for her sickle cell pain.  She will be started with pain treatment here and if we are unable to adequately control her pain, sickle cell team will be consulted for further management.  Independent labs interpretation:  The following labs were independently interpreted: Labs at baseline  Independent visualization of imaging: N/A  Consultation: - Consulted or discussed management/test interpretation w/ external professional: N/A  Consideration for admission or further workup: Patient has no emergent conditions requiring admission or further work-up at this time and is stable for discharge home with hematology follow-up Social Determinants of health: N/A    Amount and/or Complexity of Data Reviewed Labs: ordered.  Risk Prescription drug management.          Final Clinical Impression(s) / ED Diagnoses Final diagnoses:  Sickle cell pain crisis [facility])    Rx / DC Orders ED Discharge Orders     None         Kemper Durie, DO 12/23/21 1402

## 2021-12-23 NOTE — Telephone Encounter (Signed)
Pt called looking to come to the day hospital today for sickle cell pain. Pt reported 10/10 pain in both legs. Pt reported taking Oxycodone 30 mg last night at 11 PM, and is now out of her medication. Pt reports being in ER last week. Pt denies COVID symptoms and exposure. Pt denies fever, chest pain, N/V/D, and abdominal pain. [place] Hollis, FNP notified and she said that pt should try to get prescription refilled and that pt can call back tomorrow if still having pain. RN instructed pt that day hospital would be open in morning and that pt can call back then if still having pain,pt verbalized understanding.

## 2021-12-23 NOTE — Progress Notes (Signed)
PIV consult: arrived to ED, RN reports site established. Cancel consult.

## 2022-01-02 ENCOUNTER — Telehealth (HOSPITAL_COMMUNITY): Payer: Self-pay

## 2022-01-02 ENCOUNTER — Non-Acute Institutional Stay (HOSPITAL_BASED_OUTPATIENT_CLINIC_OR_DEPARTMENT_OTHER)
Admission: AD | Admit: 2022-01-02 | Discharge: 2022-01-02 | Disposition: A | Discharge status: Home / Self Care | Payer: Medicare Other | Source: Ambulatory Visit | Attending: Internal Medicine | Admitting: Internal Medicine

## 2022-01-02 ENCOUNTER — Encounter (HOSPITAL_COMMUNITY): Payer: Self-pay | Admitting: Family Medicine

## 2022-01-02 DIAGNOSIS — D57219 Sickle-cell/Hb-C disease with crisis, unspecified: Secondary | ICD-10-CM | POA: Insufficient documentation

## 2022-01-02 DIAGNOSIS — F1120 Opioid dependence, uncomplicated: Secondary | ICD-10-CM | POA: Insufficient documentation

## 2022-01-02 DIAGNOSIS — G894 Chronic pain syndrome: Secondary | ICD-10-CM | POA: Insufficient documentation

## 2022-01-02 DIAGNOSIS — D5700 Hb-SS disease with crisis, unspecified: Secondary | ICD-10-CM | POA: Diagnosis not present

## 2022-01-02 LAB — CBC WITH DIFFERENTIAL/PLATELET
Abs Immature Granulocytes: 0.03 10*3/uL (ref 0.00–0.07)
Basophils Absolute: 0 10*3/uL (ref 0.0–0.1)
Basophils Relative: 0 %
Eosinophils Absolute: 0.2 10*3/uL (ref 0.0–0.5)
Eosinophils Relative: 2 %
HCT: 37.7 % (ref 36.0–46.0)
Hemoglobin: 12.8 g/dL (ref 12.0–15.0)
Immature Granulocytes: 0 %
Lymphocytes Relative: 20 %
Lymphs Abs: 1.8 10*3/uL (ref 0.7–4.0)
MCH: 24 pg — ABNORMAL LOW (ref 26.0–34.0)
MCHC: 34 g/dL (ref 30.0–36.0)
MCV: 70.6 fL — ABNORMAL LOW (ref 80.0–100.0)
Monocytes Absolute: 0.6 10*3/uL (ref 0.1–1.0)
Monocytes Relative: 7 %
Neutro Abs: 6.5 10*3/uL (ref 1.7–7.7)
Neutrophils Relative %: 71 %
Platelets: 285 10*3/uL (ref 150–400)
RBC: 5.34 MIL/uL — ABNORMAL HIGH (ref 3.87–5.11)
RDW: 14.6 % (ref 11.5–15.5)
WBC: 9.1 10*3/uL (ref 4.0–10.5)
nRBC: 0 % (ref 0.0–0.2)

## 2022-01-02 LAB — RETICULOCYTES
Immature Retic Fract: 33.7 % — ABNORMAL HIGH (ref 2.3–15.9)
RBC.: 5.31 MIL/uL — ABNORMAL HIGH (ref 3.87–5.11)
Retic Count, Absolute: 184.3 10*3/uL (ref 19.0–186.0)
Retic Ct Pct: 3.5 % — ABNORMAL HIGH (ref 0.4–3.1)

## 2022-01-02 LAB — COMPREHENSIVE METABOLIC PANEL
ALT: 19 U/L (ref 0–44)
AST: 15 U/L (ref 15–41)
Albumin: 4.1 g/dL (ref 3.5–5.0)
Alkaline Phosphatase: 69 U/L (ref 38–126)
Anion gap: 7 (ref 5–15)
BUN: 8 mg/dL (ref 6–20)
CO2: 25 mmol/L (ref 22–32)
Calcium: 9.2 mg/dL (ref 8.9–10.3)
Chloride: 109 mmol/L (ref 98–111)
Creatinine, Ser: 0.59 mg/dL (ref 0.44–1.00)
GFR, Estimated: >60 mL/min (ref >60)
Glucose, Bld: 122 mg/dL — ABNORMAL HIGH (ref 70–99)
Potassium: 3.9 mmol/L (ref 3.5–5.1)
Sodium: 141 mmol/L (ref 135–145)
Total Bilirubin: 0.6 mg/dL (ref 0.3–1.2)
Total Protein: 7.7 g/dL (ref 6.5–8.1)

## 2022-01-02 MED ORDER — NALOXONE HCL 0.4 MG/ML IJ SOLN: 0.4000 mg | INTRAMUSCULAR | Status: DC | PRN

## 2022-01-02 MED ORDER — ACETAMINOPHEN 500 MG PO TABS
1000.0000 mg | ORAL_TABLET | Freq: Once | ORAL | Status: AC
  Administered 2022-01-02: 1000 mg via ORAL
  Filled 2022-01-02: qty 2

## 2022-01-02 MED ORDER — SODIUM CHLORIDE 0.45 % IV SOLN: INTRAVENOUS | Status: DC

## 2022-01-02 MED ORDER — KETOROLAC TROMETHAMINE 30 MG/ML IJ SOLN
15.0000 mg | Freq: Once | INTRAMUSCULAR | Status: AC
  Administered 2022-01-02: 15 mg via INTRAVENOUS
  Filled 2022-01-02: qty 1

## 2022-01-02 MED ORDER — ONDANSETRON HCL 4 MG/2ML IJ SOLN: 4.0000 mg | Freq: Four times a day (QID) | INTRAMUSCULAR | Status: DC | PRN

## 2022-01-02 MED ORDER — DIPHENHYDRAMINE HCL 25 MG PO CAPS: 25.0000 mg | ORAL_CAPSULE | ORAL | Status: DC | PRN

## 2022-01-02 MED ORDER — SODIUM CHLORIDE 0.9% FLUSH: 9.0000 mL | INTRAVENOUS | Status: DC | PRN

## 2022-01-02 MED ORDER — HYDROMORPHONE 1 MG/ML IV SOLN
INTRAVENOUS | Status: DC
  Administered 2022-01-02: 30 mg via INTRAVENOUS
  Administered 2022-01-02: 13.5 mg via INTRAVENOUS
  Filled 2022-01-02: qty 30

## 2022-01-02 NOTE — Discharge Summary (Signed)
[facility] Discharge Summary   Patient ID: Stephanie Burch MRN: [id number] DOB/AGE: [birth year] 41 y.o.  Admit date: 01/02/2022 Discharge date: 01/02/2022  Primary Care Physician:  Patient, No Pcp Per  Admission Diagnoses:  Active Problems:   Sickle cell pain crisis [facility])   Discharge Medications:  Allergies as of 01/02/2022   No Known Allergies      Medication List     TAKE these medications    acetaminophen 500 MG tablet Commonly known as: TYLENOL Take 1,000 mg by mouth every 6 (six) hours as needed for mild pain, moderate pain or headache.   folic acid 1 MG tablet Commonly known as: FOLVITE Take 1 mg by mouth daily.   HYDROmorphone 4 MG tablet Commonly known as: DILAUDID Take 4 mg by mouth every 6 (six) hours.   ibuprofen 200 MG tablet Commonly known as: ADVIL Take 800 mg by mouth 3 (three) times daily as needed for fever, headache or mild pain.   Oxbryta 500 MG Tabs tablet Generic drug: voxelotor Take 1,500 mg by mouth daily in the afternoon.   oxycodone 30 MG immediate release tablet Commonly known as: ROXICODONE Take 30 mg by mouth every 4 (four) hours.         Consults:  None  Significant Diagnostic Studies:  DG Chest Portable 1 View  Result Date: 12/07/2021 CLINICAL DATA:  Sickle cell anemia, pain EXAM: PORTABLE CHEST 1 VIEW COMPARISON:  11/21/2021 FINDINGS: Mild bilateral lower lobe atelectasis. No pleural effusion or pneumothorax. The heart is top-normal in size. IMPRESSION: Mild bilateral lower lobe atelectasis. Electronically Signed   By: Charline Bills M.D.   On: 12/07/2021 19:25    History of present illness:  Stephanie Burch is a 41 year old female with a medical history significant for sickle cell disease type [place], chronic pain syndrome, opiate dependence and tolerance, frequent hospitalizations, and morbid obesity presents with complaints of bilateral lower extremity and low back pain over the past 4 days.  Pain has been  unrelieved by her home medications.  Patient reports that she has been having frequent sickle cell pain crises over the past month and attributes them to changes in weather.  Patient last had Dilaudid this a.m. without sustained relief.  Pain intensity is 9/10, constant, and throbbing.  Patient denies any fever, chills, chest pain, shortness of breath, urinary symptoms, nausea, vomiting, or diarrhea.  No sick contacts, recent travel, or known exposure to COVID-19.  Sickle cell day infusion center course: Reviewed all laboratory values, largely consistent with patient's baseline. Pain managed with IV Dilaudid PCA IV fluids, 0.45% saline at 100 mL/h Toradol 15 mg IV x 1 Tylenol 1000 mg x 1 Pain intensity somewhat decreased throughout admission.  Patient will be discharged home. Advised to hydrate, and add high-dose ibuprofen medication regimen for greater pain control.  Patient expressed understanding. She is alert, oriented, and ambulating without assistance.  Patient will discharge home in a hemodynamically stable condition.  Discharge instructions: Resume all home medications.   Follow up with PCP as previously  scheduled.   Discussed the importance of drinking 64 ounces of water daily, dehydration of red blood cells may lead further sickling.   Avoid all stressors that precipitate sickle cell pain crisis.     The patient was given clear instructions to go to ER or return to medical center if symptoms do not improve, worsen or new problems develop.   Physical Exam at Discharge:  BP 124/76 (BP Location: Left Arm)  Pulse 99   Temp 98.4 F (36.9 C) (Temporal)   Resp 16   LMP 12/23/2021 (Exact Date)   SpO2 95%  Physical Exam Constitutional:      Appearance: Normal appearance. She is obese.  Eyes:     Pupils: Pupils are equal, round, and reactive to light.  Cardiovascular:     Rate and Rhythm: Normal rate and regular rhythm.     Pulses: Normal pulses.     Heart sounds: Normal  heart sounds.  Pulmonary:     Effort: Pulmonary effort is normal.     Breath sounds: Normal breath sounds.  Abdominal:     General: Abdomen is flat. Bowel sounds are normal.  Skin:    General: Skin is warm.  Neurological:     General: No focal deficit present.     Mental Status: She is alert and oriented to person, place, and time. Mental status is at baseline.  Psychiatric:        Mood and Affect: Mood normal.        Behavior: Behavior normal.        Thought Content: Thought content normal.        Judgment: Judgment normal.      Disposition at Discharge: Discharge disposition: 01-Home or Self Care       Discharge Orders:   Condition at Discharge:   Stable  Time spent on Discharge:  Greater than 30 minutes.  Signed: Nolon Nations  APRN, MSN, FNP-C Patient Care [facility] [address] [place], [place] [address] [phone]  01/04/2022, 9:15 AM

## 2022-01-02 NOTE — Telephone Encounter (Signed)
Patient called the day hospital looking to come in for sickle cell pain today. Pt reports 7/10 pain to bilateral legs. Pt reports taking Oxycodone 30 mg at 2 AM. Pt screened for COVID and denies symptoms. Pt denies chest pain, N/V/D, and abdominal pain. [place] Hollis, FNP notified and provider says that pt can come in today for treatment. Pt notified and verbalized understanding. Pt stated that her friend will be her transportation to and from the day hospital today.

## 2022-01-02 NOTE — Progress Notes (Signed)
Patient admitted to the day infusion hospital for sickle cell pain crisis. Initially, patient reported bilateral leg pain rated 8/10. For pain management, patient placed on Dilaudid PCA, given 15 mg IV Toradol, 1000 mg Tylenol and hydrated with IV fluids. At discharge, patient rated pain at 6/10. Vital signs stable. AVS offered but patient refused. Patient alert, oriented and ambulatory at discharge.

## 2022-01-03 ENCOUNTER — Other Ambulatory Visit: Payer: Self-pay

## 2022-01-03 ENCOUNTER — Inpatient Hospital Stay (HOSPITAL_COMMUNITY)
Admission: EM | Admit: 2022-01-03 | Discharge: 2022-01-06 | DRG: 812 | Disposition: A | Discharge status: Home / Self Care | Payer: Medicare Other | Attending: Internal Medicine | Admitting: Internal Medicine

## 2022-01-03 ENCOUNTER — Encounter (HOSPITAL_COMMUNITY): Payer: Self-pay

## 2022-01-03 DIAGNOSIS — Z79899 Other long term (current) drug therapy: Secondary | ICD-10-CM

## 2022-01-03 DIAGNOSIS — D638 Anemia in other chronic diseases classified elsewhere: Secondary | ICD-10-CM

## 2022-01-03 DIAGNOSIS — Z833 Family history of diabetes mellitus: Secondary | ICD-10-CM

## 2022-01-03 DIAGNOSIS — Z6841 Body Mass Index (BMI) 40.0 and over, adult: Secondary | ICD-10-CM | POA: Diagnosis not present

## 2022-01-03 DIAGNOSIS — D5700 Hb-SS disease with crisis, unspecified: Secondary | ICD-10-CM | POA: Diagnosis present

## 2022-01-03 DIAGNOSIS — F1120 Opioid dependence, uncomplicated: Secondary | ICD-10-CM | POA: Diagnosis present

## 2022-01-03 DIAGNOSIS — F32A Depression, unspecified: Secondary | ICD-10-CM | POA: Diagnosis present

## 2022-01-03 DIAGNOSIS — Z8616 Personal history of COVID-19: Secondary | ICD-10-CM

## 2022-01-03 DIAGNOSIS — Z8249 Family history of ischemic heart disease and other diseases of the circulatory system: Secondary | ICD-10-CM

## 2022-01-03 DIAGNOSIS — G894 Chronic pain syndrome: Secondary | ICD-10-CM | POA: Diagnosis present

## 2022-01-03 DIAGNOSIS — D57219 Sickle-cell/Hb-C disease with crisis, unspecified: Principal | ICD-10-CM | POA: Diagnosis present

## 2022-01-03 DIAGNOSIS — Z832 Family history of diseases of the blood and blood-forming organs and certain disorders involving the immune mechanism: Secondary | ICD-10-CM

## 2022-01-03 LAB — CBC WITH DIFFERENTIAL/PLATELET
Abs Immature Granulocytes: 0.02 10*3/uL (ref 0.00–0.07)
Basophils Absolute: 0 10*3/uL (ref 0.0–0.1)
Basophils Relative: 1 %
Eosinophils Absolute: 0.1 10*3/uL (ref 0.0–0.5)
Eosinophils Relative: 2 %
HCT: 38.7 % (ref 36.0–46.0)
Hemoglobin: 13.3 g/dL (ref 12.0–15.0)
Immature Granulocytes: 0 %
Lymphocytes Relative: 24 %
Lymphs Abs: 2.1 10*3/uL (ref 0.7–4.0)
MCH: 24.1 pg — ABNORMAL LOW (ref 26.0–34.0)
MCHC: 34.4 g/dL (ref 30.0–36.0)
MCV: 70.1 fL — ABNORMAL LOW (ref 80.0–100.0)
Monocytes Absolute: 0.5 10*3/uL (ref 0.1–1.0)
Monocytes Relative: 6 %
Neutro Abs: 5.9 10*3/uL (ref 1.7–7.7)
Neutrophils Relative %: 67 %
Platelets: 284 10*3/uL (ref 150–400)
RBC: 5.52 MIL/uL — ABNORMAL HIGH (ref 3.87–5.11)
RDW: 14.6 % (ref 11.5–15.5)
WBC: 8.8 10*3/uL (ref 4.0–10.5)
nRBC: 0.2 % (ref 0.0–0.2)

## 2022-01-03 LAB — RETICULOCYTES
Immature Retic Fract: 26.5 % — ABNORMAL HIGH (ref 2.3–15.9)
RBC.: 5.43 MIL/uL — ABNORMAL HIGH (ref 3.87–5.11)
Retic Count, Absolute: 180.3 10*3/uL (ref 19.0–186.0)
Retic Ct Pct: 3.3 % — ABNORMAL HIGH (ref 0.4–3.1)

## 2022-01-03 LAB — COMPREHENSIVE METABOLIC PANEL
ALT: 17 U/L (ref 0–44)
AST: 13 U/L — ABNORMAL LOW (ref 15–41)
Albumin: 3.7 g/dL (ref 3.5–5.0)
Alkaline Phosphatase: 71 U/L (ref 38–126)
Anion gap: 7 (ref 5–15)
BUN: 7 mg/dL (ref 6–20)
CO2: 25 mmol/L (ref 22–32)
Calcium: 9.6 mg/dL (ref 8.9–10.3)
Chloride: 109 mmol/L (ref 98–111)
Creatinine, Ser: 0.66 mg/dL (ref 0.44–1.00)
GFR, Estimated: >60 mL/min (ref >60)
Glucose, Bld: 96 mg/dL (ref 70–99)
Potassium: 4.2 mmol/L (ref 3.5–5.1)
Sodium: 141 mmol/L (ref 135–145)
Total Bilirubin: 0.8 mg/dL (ref 0.3–1.2)
Total Protein: 7.8 g/dL (ref 6.5–8.1)

## 2022-01-03 LAB — I-STAT BETA HCG BLOOD, ED (MC, WL, AP ONLY): I-stat hCG, quantitative: <5 m[IU]/mL (ref <5)

## 2022-01-03 MED ORDER — SODIUM CHLORIDE 0.9% FLUSH: 9.0000 mL | INTRAVENOUS | Status: DC | PRN

## 2022-01-03 MED ORDER — ENOXAPARIN SODIUM 40 MG/0.4ML IJ SOSY
40.0000 mg | PREFILLED_SYRINGE | INTRAMUSCULAR | Status: DC
  Administered 2022-01-04 – 2022-01-06 (×3): 40 mg via SUBCUTANEOUS
  Filled 2022-01-03 (×3): qty 0.4

## 2022-01-03 MED ORDER — POLYETHYLENE GLYCOL 3350 17 G PO PACK: 17.0000 g | PACK | Freq: Every day | ORAL | Status: DC | PRN

## 2022-01-03 MED ORDER — NALOXONE HCL 0.4 MG/ML IJ SOLN: 0.4000 mg | INTRAMUSCULAR | Status: DC | PRN

## 2022-01-03 MED ORDER — HYDROMORPHONE 1 MG/ML IV SOLN
INTRAVENOUS | Status: DC
  Administered 2022-01-03: 30 mg via INTRAVENOUS
  Administered 2022-01-04: 8 mg via INTRAVENOUS
  Administered 2022-01-04: 4 mg via INTRAVENOUS
  Administered 2022-01-04: 6.5 mg via INTRAVENOUS
  Administered 2022-01-04: 15.5 mg via INTRAVENOUS
  Administered 2022-01-04: 6 mg via INTRAVENOUS
  Administered 2022-01-04: 9 mg via INTRAVENOUS
  Administered 2022-01-05: 6.5 mg via INTRAVENOUS
  Administered 2022-01-05: 30 mg via INTRAVENOUS
  Administered 2022-01-05: 7.5 mg via INTRAVENOUS
  Administered 2022-01-05: 6.5 mg via INTRAVENOUS
  Administered 2022-01-05: 30 mg via INTRAVENOUS
  Administered 2022-01-05: 7.5 mg via INTRAVENOUS
  Administered 2022-01-05: 6 mg via INTRAVENOUS
  Administered 2022-01-05: 10.5 mg via INTRAVENOUS
  Administered 2022-01-06: 12 mg via INTRAVENOUS
  Administered 2022-01-06: 30 mg via INTRAVENOUS
  Administered 2022-01-06: 8 mg via INTRAVENOUS
  Administered 2022-01-06: 5.5 mg via INTRAVENOUS
  Administered 2022-01-06: 6 mg via INTRAVENOUS
  Filled 2022-01-03 (×5): qty 30

## 2022-01-03 MED ORDER — SODIUM CHLORIDE 0.9 % IV SOLN
12.5000 mg | Freq: Once | INTRAVENOUS | Status: DC
  Filled 2022-01-03: qty 0.25

## 2022-01-03 MED ORDER — HYDROMORPHONE 1 MG/ML IV SOLN: INTRAVENOUS | Status: DC

## 2022-01-03 MED ORDER — KETOROLAC TROMETHAMINE 15 MG/ML IJ SOLN
15.0000 mg | Freq: Four times a day (QID) | INTRAMUSCULAR | Status: DC
  Administered 2022-01-03 – 2022-01-06 (×11): 15 mg via INTRAVENOUS
  Filled 2022-01-03 (×11): qty 1

## 2022-01-03 MED ORDER — HYDROMORPHONE HCL 2 MG/ML IJ SOLN
2.0000 mg | INTRAMUSCULAR | Status: DC | PRN
  Administered 2022-01-03: 2 mg via INTRAVENOUS
  Filled 2022-01-03: qty 1

## 2022-01-03 MED ORDER — ONDANSETRON HCL 4 MG/2ML IJ SOLN
4.0000 mg | Freq: Four times a day (QID) | INTRAMUSCULAR | Status: DC | PRN
  Administered 2022-01-04: 4 mg via INTRAVENOUS
  Filled 2022-01-03: qty 2

## 2022-01-03 MED ORDER — HYDROMORPHONE HCL 2 MG/ML IJ SOLN
2.0000 mg | INTRAMUSCULAR | Status: AC
  Administered 2022-01-03: 2 mg via INTRAVENOUS
  Filled 2022-01-03: qty 1

## 2022-01-03 MED ORDER — ONDANSETRON HCL 4 MG/2ML IJ SOLN
4.0000 mg | INTRAMUSCULAR | Status: DC | PRN
  Administered 2022-01-03: 4 mg via INTRAVENOUS
  Filled 2022-01-03: qty 2

## 2022-01-03 MED ORDER — OXYCODONE HCL 5 MG PO TABS
30.0000 mg | ORAL_TABLET | Freq: Once | ORAL | Status: DC
  Filled 2022-01-03: qty 6

## 2022-01-03 MED ORDER — SODIUM CHLORIDE 0.45 % IV SOLN: INTRAVENOUS | Status: AC

## 2022-01-03 MED ORDER — FOLIC ACID 1 MG PO TABS
1.0000 mg | ORAL_TABLET | Freq: Every day | ORAL | Status: DC
  Administered 2022-01-04 – 2022-01-06 (×3): 1 mg via ORAL
  Filled 2022-01-03 (×3): qty 1

## 2022-01-03 MED ORDER — OXYCODONE HCL 5 MG PO TABS
30.0000 mg | ORAL_TABLET | ORAL | Status: DC | PRN
  Administered 2022-01-04 – 2022-01-06 (×11): 30 mg via ORAL
  Filled 2022-01-03 (×11): qty 6

## 2022-01-03 MED ORDER — DIPHENHYDRAMINE HCL 25 MG PO CAPS: 25.0000 mg | ORAL_CAPSULE | ORAL | Status: DC | PRN

## 2022-01-03 MED ORDER — SENNOSIDES-DOCUSATE SODIUM 8.6-50 MG PO TABS
1.0000 | ORAL_TABLET | Freq: Two times a day (BID) | ORAL | Status: DC
  Administered 2022-01-05: 1 via ORAL
  Filled 2022-01-03 (×4): qty 1

## 2022-01-03 MED ORDER — VOXELOTOR 500 MG PO TABS
1500.0000 mg | ORAL_TABLET | Freq: Every day | ORAL | Status: DC
  Administered 2022-01-03 – 2022-01-05 (×3): 1500 mg via ORAL

## 2022-01-03 MED ORDER — ACETAMINOPHEN 500 MG PO TABS: 1000.0000 mg | ORAL_TABLET | Freq: Four times a day (QID) | ORAL | Status: DC | PRN

## 2022-01-03 NOTE — H&P (Signed)
History and Physical    Patient: Stephanie Burch CWC:[id number] DOB: 16-Oct-1980 DOA: 01/03/2022 DOS: the patient was seen and examined on 01/03/2022 PCP: Patient, No Pcp Per  Patient coming from: Home  Chief Complaint:  Chief Complaint  Patient presents with   Sickle Cell Pain Crisis   HPI: Stephanie Burch is a 41 y.o. female with medical history significant of sickle cell disease, chronic pain syndrome, opiate dependence and tolerance, and history of anemia of chronic disease who presents for sickle cell crisis.   Pt has been seen in the ED and admitted numerous time in the past month. She was just seen in sickle cell clinic yesterday but feels her pain was not adequately controlled. Takes oxycodone and Dilaudid at home without relieve.  Has pain around bilateral arms and legs.  Denies chest pain or shortness of breath. State her condition has been worsening over the past 3 years. She determine to try other methods such as therapy to help with her pain.    Review of Systems: As mentioned in the history of present illness. All other systems reviewed and are negative. Past Medical History:  Diagnosis Date   Acute kidney injury (HCC) 11/07/2019   Anemia    Chronic pain    COVID-19 virus detected 10/22/2019   Depression    HCAP (healthcare-associated pneumonia) 11/07/2019   History of blood transfusion 2013   x 2   HSV infection    Hypoxia    Pneumonia    x 1   Pulmonary edema 11/11/2019   Sepsis (HCC) 11/07/2019   Sickle cell anemia (HCC) 09/2021   gets them frequently   Tachycardia with heart rate 100-120 beats per minute    Vitamin B12 deficiency 12/2018   Vitamin D deficiency    Past Surgical History:  Procedure Laterality Date   CESAREAN SECTION  2013   x 1   TOOTH EXTRACTION N/A 10/18/2021   Procedure: DENTAL RESTORATION/EXTRACTIONS;  Surgeon: Ocie Doyne, DMD;  Location: [facility] OR;  Service: Oral Surgery;  Laterality: N/A;   Social History:  reports that she has never  smoked. She has never used smokeless tobacco. She reports that she does not currently use alcohol. She reports that she does not use drugs.  No Known Allergies  Family History  Problem Relation Age of Onset   Hypertension Mother    Sickle cell trait Mother    Diabetes Father    Sickle cell trait Father     Prior to Admission medications   Medication Sig Start Date End Date Taking? Authorizing Provider  acetaminophen (TYLENOL) 500 MG tablet Take 1,000 mg by mouth every 6 (six) hours as needed for mild pain, moderate pain or headache.   Yes [provider]  folic acid (FOLVITE) 1 MG tablet Take 1 mg by mouth daily.   Yes [provider]  HYDROmorphone (DILAUDID) 4 MG tablet Take 4 mg by mouth every 6 (six) hours.   Yes [provider]  ibuprofen (ADVIL) 200 MG tablet Take 800 mg by mouth 3 (three) times daily as needed for fever, headache or mild pain.   Yes [provider]  ondansetron (ZOFRAN) 4 MG tablet Take 4 mg by mouth every 8 (eight) hours as needed for nausea or vomiting.   Yes [provider]  OXBRYTA 500 MG TABS tablet Take 1,500 mg by mouth daily in the afternoon. 10/04/21  Yes [provider]  oxycodone (ROXICODONE) 30 MG immediate release tablet Take 30 mg by mouth  every 4 (four) hours.   Yes [provider]    Physical Exam: Vitals:   01/03/22 1900 01/03/22 2028 01/03/22 2048 01/03/22 2230  BP: 137/78  (!) 172/100   Pulse: 77  86   Resp: 16  18 18   Temp:  98.1 F (36.7 C) 98.5 F (36.9 C)   TempSrc:  Oral Oral   SpO2: 99%  98% 98%  Weight:      Height:       Constitutional: NAD, calm, comfortable, morbidly obese female sitting upright in bed Eyes:  lids and conjunctivae normal ENMT: Mucous membranes are moist. Neck: normal, supple Respiratory: clear to auscultation bilaterally, no wheezing, no crackles. Normal respiratory effort. No accessory muscle use.  Cardiovascular: Regular rate and rhythm, no  murmurs / rubs / gallops. No extremity edema.  Abdomen: no tenderness,  Bowel sounds positive.  Musculoskeletal: no clubbing / cyanosis. No joint deformity upper and lower extremities. Good ROM, no contractures. Normal muscle tone.  Skin: no rashes, lesions, ulcers. No induration Neurologic: CN 2-12 grossly intact.  Strength 5/5 in all 4.  Psychiatric: Normal judgment and insight. Alert and oriented x 3. Normal mood. Data Reviewed:  See HPI  Assessment and Plan: * Sickle cell crisis (HCC) Sickle cell pain crisis Continues 0.45% IV fluids  IV Dilaudid via PCA with settings of 0.5mg , 10-minute lockout with max of 3mg /hr Toradol 15mg  q6HR Continue home oxycodone IR 30mg  q4hr PRN for breakthrough pain Monitor vital signs closely and re-evaluate pain scale    Anemia of chronic disease -Hgb stable at 13  BMI 50.0-59.9, adult (HCC) BMI of 35      Advance Care Planning:   Code Status: Full Code   Consults: none  Family Communication: none at bedside  Severity of Illness: The appropriate patient status for this patient is OBSERVATION. Observation status is judged to be reasonable and necessary in order to provide the required intensity of service to ensure the patient's safety. The patient's presenting symptoms, physical exam findings, and initial radiographic and laboratory data in the context of their medical condition is felt to place them at decreased risk for further clinical deterioration. Furthermore, it is anticipated that the patient will be medically stable for discharge from the hospital within 2 midnights of admission.   Author: , DO 01/03/2022 11:13 PM  For on call review www. .

## 2022-01-03 NOTE — ED Provider Triage Note (Signed)
Emergency Medicine Provider Triage Evaluation Note  Stephanie Burch , a 41 y.o. female  was evaluated in triage.  Pt complains of bilateral leg pain for 6 weeks. Was recently inpatient due to severe pain and went into sickle cell clinic yesterday due to severe pain.  She reports that she was advised about potential need for inpatient admission once again for pain management as pain appears to be unrelenting.  Denies shortness of breath, chest pain, abdominal pain, nausea, vomiting, diarrhea, or fever.  Review of Systems  Positive: As above Negative: As above  Physical Exam  BP (!) 147/83 (BP Location: Left Arm)   Pulse 82   Temp 98.3 F (36.8 C) (Oral)   Resp 18   Ht 5\' 5"  (1.651 m)   Wt (!) 145.2 kg   LMP 12/23/2021 (Exact Date)   SpO2 98%   BMI 53.25 kg/m  Gen:   Awake, visible uncomfortable Resp:  Normal effort  MSK:   Slow movement in legs due to increased pain with movement. Other:  No pallor, pulselessness, or paresthesias  Medical Decision Making  Medically screening exam initiated at 10:16 AM.  Appropriate orders placed.  [place] Warriner Bertagnolli was informed that the remainder of the evaluation will be completed by another provider, this initial triage assessment does not replace that evaluation, and the importance of remaining in the ED until their evaluation is complete.    Clancy Gourd, PA-C 01/03/22 1026

## 2022-01-03 NOTE — ED Notes (Signed)
Pt stated that she has to get an Ultrasound IV.

## 2022-01-03 NOTE — Assessment & Plan Note (Addendum)
Sickle cell pain crisis Continues 0.45% IV fluids  IV Dilaudid via PCA with settings of 0.5mg , 10-minute lockout with max of 3mg /hr Toradol 15mg  q6HR Continue home oxycodone IR 30mg  q4hr PRN for breakthrough pain Monitor vital signs closely and re-evaluate pain scale

## 2022-01-03 NOTE — Assessment & Plan Note (Signed)
BMI of 35 

## 2022-01-03 NOTE — Assessment & Plan Note (Signed)
-  Hgb stable at 13

## 2022-01-03 NOTE — ED Triage Notes (Signed)
Patient c/o sickle cell pain in bilateral legs and left arm. Patient states she went to the sickle cell clinic yesterday.

## 2022-01-03 NOTE — ED Provider Notes (Cosign Needed Addendum)
[facility] DEPT Provider Note   CSN: [id number] Arrival date & time: 01/03/22  [id number]     History  Chief Complaint  Patient presents with   Sickle Cell Pain Crisis    Stephanie Burch is a 41 y.o. female with Hx of sickle cell disease and chronic pain syndrome with chief complaint of sickle cell pain crisis.  Reports significant pain in both legs over the last 2 days.  States this is where she normally has her sickle cell pain, and that this feels similar to prior exacerbations.  Has been to the ED for sickle cell pain clinic at least 6 times in the last month for the same presentation, including yesterday.  Normally takes oxycodone 30 mg and Dilaudid 4 mg at home for pain.  Last took her oxycodone 30 mg yesterday after being discharged from the sickle cell pain clinic.  Reports she was informed yesterday at the clinic that her pain would likely continue to worsen with time/age, and was recommended to come to the ED for likely admission if her pain was still significant today.  Patient reports her pain is back to a 8/10 today.  Denies chest pain, shortness of breath, fever, chills, numbness, weakness, vision changes, or recent injury.  States she is able to walk and weight-bear, though with some discomfort.  No new features of pain from prior episodes.  The history is provided by the patient and medical records.  Sickle Cell Pain Crisis      Home Medications Prior to Admission medications   Medication Sig Start Date End Date Taking? Authorizing Provider  acetaminophen (TYLENOL) 500 MG tablet Take 1,000 mg by mouth every 6 (six) hours as needed for mild pain, moderate pain or headache.    [provider]  folic acid (FOLVITE) 1 MG tablet Take 1 mg by mouth daily.    [provider]  HYDROmorphone (DILAUDID) 4 MG tablet Take 4 mg by mouth every 6 (six) hours as needed for severe pain.    [provider]  ibuprofen (ADVIL) 200 MG tablet  Take 800 mg by mouth 3 (three) times daily as needed for fever, headache or mild pain.    [provider]  OXBRYTA 500 MG TABS tablet Take 1,500 mg by mouth daily in the afternoon. 10/04/21   [provider]  oxycodone (ROXICODONE) 30 MG immediate release tablet Take 30 mg by mouth every 4 (four) hours as needed for pain.    [provider]      Allergies    Patient has no known allergies.    Review of Systems   Review of Systems  Musculoskeletal:        Leg pain, arm pain    Physical Exam Updated Vital Signs BP (!) 141/97   Pulse 84   Temp 98.3 F (36.8 C) (Oral)   Resp 18   Ht 5\' 5"  (1.651 m)   Wt (!) 145.2 kg   LMP 12/23/2021 (Exact Date)   SpO2 100%   BMI 53.25 kg/m  Physical Exam Vitals and nursing note reviewed.  Constitutional:      General: She is not in acute distress.    Appearance: She is well-developed. She is not ill-appearing, toxic-appearing or diaphoretic.     Comments: Appears mildly uncomfortable, tearful  HENT:     Head: Normocephalic and atraumatic.     Nose: Nose normal.     Mouth/Throat:     Mouth: Mucous membranes are moist.  Pharynx: Oropharynx is clear.  Eyes:     General:        Right eye: No discharge.        Left eye: No discharge.     Conjunctiva/sclera: Conjunctivae normal.     Comments: Gaze aligned appropriately.  EOMI to this provider's movements.  Cardiovascular:     Rate and Rhythm: Normal rate and regular rhythm.     Heart sounds: No murmur heard.    Comments: Chest non-TTP.  DP and PT pulses 2+ bilaterally Pulmonary:     Effort: Pulmonary effort is normal. No respiratory distress.     Breath sounds: Normal breath sounds. No stridor. No wheezing, rhonchi or rales.     Comments: CTAB, equal chest rise, communicates without difficulty, without increased respiratory effort Chest:     Chest wall: No tenderness.  Abdominal:     General: There is no distension.     Palpations: Abdomen is soft.      Tenderness: There is no abdominal tenderness. There is no guarding.  Musculoskeletal:        General: Tenderness present. No swelling.     Cervical back: Neck supple. No rigidity.     Comments: Bilateral lower extremities just proximal to the anterior knee joint.  Without significant ecchymosis, erythema, swelling, or warmth.  Mildly reduced ROM elicited with tenderness.  Skin:    General: Skin is warm and dry.     Capillary Refill: Capillary refill takes less than 2 seconds.     Coloration: Skin is not pale.  Neurological:     Mental Status: She is alert and oriented to person, place, and time.  Psychiatric:        Mood and Affect: Mood normal.     ED Results / Procedures / Treatments   Labs (all labs ordered are listed, but only abnormal results are displayed) Labs Reviewed  CBC WITH DIFFERENTIAL/PLATELET - Abnormal; Notable for the following components:      Result Value   RBC 5.52 (*)    MCV 70.1 (*)    MCH 24.1 (*)    All other components within normal limits  RETICULOCYTES - Abnormal; Notable for the following components:   Retic Ct Pct 3.3 (*)    RBC. 5.43 (*)    Immature Retic Fract 26.5 (*)    All other components within normal limits  COMPREHENSIVE METABOLIC PANEL - Abnormal; Notable for the following components:   AST 13 (*)    All other components within normal limits  I-STAT BETA HCG BLOOD, ED ([facility], [facility], [facility] ONLY)    EKG None  Radiology No results found.  Procedures Procedures    Medications Ordered in ED Medications  oxyCODONE (Oxy IR/ROXICODONE) immediate release tablet 30 mg (5 mg Oral Incomplete 01/03/22 1607)  HYDROmorphone (DILAUDID) injection 2 mg (has no administration in time range)  HYDROmorphone (DILAUDID) injection 2 mg (has no administration in time range)  diphenhydrAMINE (BENADRYL) 12.5 mg in sodium chloride 0.9 % 50 mL IVPB (has no administration in time range)  ondansetron (ZOFRAN) injection 4 mg (4 mg Intravenous Given 01/03/22 1659)   HYDROmorphone (DILAUDID) injection 2 mg (2 mg Intravenous Given 01/03/22 1700)    ED Course/ Medical Decision Making/ A&P Clinical Course as of 01/03/22 1850  Fri Jan 03, 2022  1849 Consulted with Dr. Cyndia Bent of hospitalist team.  Reviewed patient's case and history in detail.  Patient has not improved with 3 rounds of pain medication, per patient was recommended to come to  the ED for further treatment and likely admission.  Agrees with plan for admission. [AC]    Clinical Course User Index [AC] Prince Rome, PA-C                           Medical Decision Making Amount and/or Complexity of Data Reviewed Labs: ordered.  Risk Prescription drug management. Decision regarding hospitalization.   41 y.o. female presents to the ED for concern of Sickle Cell Pain Crisis     This involves an extensive number of treatment options, and is a complaint that carries with it a high risk of complications and morbidity.  The emergent differential diagnosis prior to evaluation includes, but is not limited to: Sickle Cell Crisis  This is not an exhaustive differential.   Past Medical History / Co-morbidities / Social History: Hx of sickle cell disease, depression, anemia, HSV, chronic pain syndrome Social Determinants of Health include: None  Additional History:  Obtained by chart review.  Notably recent visits/notes from Internal Medicine/Sickle Cell Clinic including: 01/02/22 "Patient called the day hospital looking to come in for sickle cell pain today. Pt reports 7/10 pain to bilateral legs. Pt reports taking Oxycodone 30 mg at 2 AM. Pt screened for COVID and denies symptoms. Pt denies chest pain, N/V/D, and abdominal pain." Was then admitted to day infusion hospital for sickle cell pain crisis.  Pain decreased from 8/10 to 6/10 with pain management.  Pt discharged. Seen 7 times in last month for sickle cell pain crisis  Lab Tests: I ordered, and personally interpreted labs.  The pertinent  results include:   RBC 5.52 without significant changes.  No elevated WBC or significant anemia. Negative pregnancy  Imaging Studies: None  Cardiac Monitoring: The patient was maintained on a cardiac monitor.  I personally viewed and interpreted the cardiac monitored which showed an underlying rhythm of: NSR  ED Course / Critical Interventions: Pt overall well-appearing though tearful on exam.  Afebrile.  Nontoxic, nonseptic appearing in NAD.  Presenting to the ED due to acute sickle cell pain crisis over the last 2 days.  Pain located over the anterior lower extremities, just proximal to the knee joint.  Patient reports this is where she usually has her.  Has been seen 6 or more times in the ED/sickle cell clinic for pain in this area over the last month alone.  Without shortness of breath, chest pain, vision changes.  No changes in urinary or bowel habits.  Last took her hydrocodone 30 mg last night after discharge from sickle cell clinic.  Normally takes 30 mg oxycodone and 4 mg Dilaudid at home for sickle cell pain, opioid tolerant.  Was given 5 mg oxycodone after triage in the ED today, to which patient reports no improvement. Mildly tender on exam, with mildly reduced ROM, though still without significant erythema, swelling, crepitus, or reduced/inhibited ROM.  Good strong pulses of lower extremities, appear neurovascularly intact.  Low suspicion for septic arthritis, effusion, or gout at this time. Upon reevaluation, patient reports mild improvement of pain from 8/10 to 7/10, however states this is now manageable from home.  Shared decision making with patient.  Recommend admission for further pain management, patient in agreement. Consulted with medicine, see note above, plan for admission.  Disposition: Admission   This chart was dictated using voice recognition software.  Despite best efforts to proofread, errors can occur which can change the documentation  meaning.  Final Clinical Impression(s) / ED Diagnoses Final diagnoses:  Sickle cell pain crisis [facility])    Rx / DC Orders ED Discharge Orders     None         Cecil Cobbs, PA-C 01/03/22 1851    Cecil Cobbs, PA-C 01/04/22 2482    Gwyneth Sprout, MD 01/05/22 1401

## 2022-01-04 DIAGNOSIS — Z8616 Personal history of COVID-19: Secondary | ICD-10-CM | POA: Diagnosis not present

## 2022-01-04 DIAGNOSIS — Z833 Family history of diabetes mellitus: Secondary | ICD-10-CM | POA: Diagnosis not present

## 2022-01-04 DIAGNOSIS — D57219 Sickle-cell/Hb-C disease with crisis, unspecified: Secondary | ICD-10-CM | POA: Diagnosis present

## 2022-01-04 DIAGNOSIS — G894 Chronic pain syndrome: Secondary | ICD-10-CM | POA: Diagnosis present

## 2022-01-04 DIAGNOSIS — F32A Depression, unspecified: Secondary | ICD-10-CM | POA: Diagnosis present

## 2022-01-04 DIAGNOSIS — Z6841 Body Mass Index (BMI) 40.0 and over, adult: Secondary | ICD-10-CM | POA: Diagnosis not present

## 2022-01-04 DIAGNOSIS — Z8249 Family history of ischemic heart disease and other diseases of the circulatory system: Secondary | ICD-10-CM | POA: Diagnosis not present

## 2022-01-04 DIAGNOSIS — Z79899 Other long term (current) drug therapy: Secondary | ICD-10-CM | POA: Diagnosis not present

## 2022-01-04 DIAGNOSIS — F1120 Opioid dependence, uncomplicated: Secondary | ICD-10-CM | POA: Diagnosis present

## 2022-01-04 DIAGNOSIS — D5700 Hb-SS disease with crisis, unspecified: Secondary | ICD-10-CM | POA: Diagnosis present

## 2022-01-04 DIAGNOSIS — D638 Anemia in other chronic diseases classified elsewhere: Secondary | ICD-10-CM | POA: Diagnosis present

## 2022-01-04 DIAGNOSIS — Z832 Family history of diseases of the blood and blood-forming organs and certain disorders involving the immune mechanism: Secondary | ICD-10-CM | POA: Diagnosis not present

## 2022-01-04 MED ORDER — ORAL CARE MOUTH RINSE: 15.0000 mL | OROMUCOSAL | Status: DC | PRN

## 2022-01-04 NOTE — TOC Initial Note (Signed)
Transition of Care [facility]) - Initial/Assessment Note    Patient Details  Name: Stephanie Burch MRN: [id number] Date of Birth: 07-08-1980  Transition of Care [facility]) CM/SW Contact:    Adrian Prows, RN Phone Number: 01/04/2022, 5:37 PM  Clinical Narrative:                  Transition of Care [facility]) Screening Note   Patient Details  Name: Stephanie Burch Date of Birth: [birth year]   Transition of Care [facility]) CM/SW Contact:    Adrian Prows, RN Phone Number: 01/04/2022, 5:37 PM    Transition of Care Department [facility]) has reviewed patient and no TOC needs have been identified at this time. We will continue to monitor patient advancement through interdisciplinary progression rounds. If new patient transition needs arise, please place a TOC consult.          Patient Goals and CMS Choice        Expected Discharge Plan and Services                                                Prior Living Arrangements/Services                       Activities of Daily Living Home Assistive Devices/Equipment: None ADL Screening (condition at time of admission) Patient's cognitive ability adequate to safely complete daily activities?: Yes Is the patient deaf or have difficulty hearing?: No Does the patient have difficulty seeing, even when wearing glasses/contacts?: No Does the patient have difficulty concentrating, remembering, or making decisions?: No Patient able to express need for assistance with ADLs?: Yes Does the patient have difficulty dressing or bathing?: No Independently performs ADLs?: Yes (appropriate for developmental age) Does the patient have difficulty walking or climbing stairs?: No Weakness of Legs: Both Weakness of Arms/Hands: None  Permission Sought/Granted                  Emotional Assessment              Admission diagnosis:  Sickle cell crisis (HCC) [D57.00] Sickle cell pain crisis (HCC) [D57.00] Patient  Active Problem List   Diagnosis Date Noted   Anemia of chronic disease 01/03/2022   Sickle cell crisis (HCC) 12/07/2021   Sickle cell pain crisis (HCC) 11/21/2021   Pulmonary hypertension (HCC) 11/21/2021   Hypoxia    Poor venous access 11/09/2019   History of COVID-19 11/07/2019   Vaginal odor 04/20/2019   Chest pain varying with breathing    Vitamin D deficiency 11/15/2018   Medication management 09/14/2018   Muscle spasms of both lower extremities 08/25/2018   Subjective visual disturbance of right eye 08/25/2018   Hb-SS disease without crisis (HCC) 04/13/2018   Chronic pain syndrome 03/16/2018   Chronic, continuous use of opioids 03/16/2018   Leukocytosis 10/19/2017   Thrombocytopenia (HCC) 10/19/2017   BMI 50.0-59.9, adult (HCC) 10/19/2017   Constipation due to pain medication 08/29/2014   PCP:  Patient, No Pcp Per Pharmacy:   [id number] - [place], [place] - [address] [place] [address] Phone: [phone] Fax: [phone]     Social Determinants of Health (SDOH) Interventions    Readmission Risk Interventions    12/19/2020   12:15 PM 08/14/2020   10:14 AM 07/17/2020  11:13 AM  Readmission Risk Prevention Plan  Transportation Screening Complete Complete Complete  PCP or Specialist Appt within 3-5 Days  Complete   HRI or Home Care Consult  Complete Complete  Social Work Consult for Recovery Care Planning/Counseling  Complete Complete  Palliative Care Screening  Not Applicable   Medication Review [facility]) Complete Complete Complete  HRI or Home Care Consult Complete    SW Recovery Care/Counseling Consult Complete    Palliative Care Screening Not Applicable    Skilled Nursing Facility Not Applicable

## 2022-01-04 NOTE — H&P (Signed)
[facility] History and Physical   Date: 01/04/2022  Patient name: Stephanie Burch Medical record number: [id number] Date of birth: [birth year] Age: 41 y.o. Gender: female PCP: Patient, No Pcp Per  Attending physician: No att. providers found  Chief Complaint: Sickle cell pain   History of present illness:  Stephanie Burch is a 41 year old female with a medical history significant for sickle cell disease type Many, chronic pain syndrome, opiate dependence and tolerance, frequent hospitalizations, and morbid obesity presents with complaints of bilateral lower extremity and low back pain over the past 4 days.  Pain has been unrelieved by her home medications.  Patient reports that she has been having frequent sickle cell pain crises over the past month and attributes them to changes in weather.  Patient last had Dilaudid this a.m. without sustained relief.  Pain intensity is 9/10, constant, and throbbing.  Patient denies any fever, chills, chest pain, shortness of breath, urinary symptoms, nausea, vomiting, or diarrhea.  No sick contacts, recent travel, or known exposure to COVID-19.  Meds: Medications Prior to Admission  Medication Sig Dispense Refill Last Dose   acetaminophen (TYLENOL) 500 MG tablet Take 1,000 mg by mouth every 6 (six) hours as needed for mild pain, moderate pain or headache.   01/02/2022   folic acid (FOLVITE) 1 MG tablet Take 1 mg by mouth daily.   Past Week   HYDROmorphone (DILAUDID) 4 MG tablet Take 4 mg by mouth every 6 (six) hours.   Past Week   ibuprofen (ADVIL) 200 MG tablet Take 800 mg by mouth 3 (three) times daily as needed for fever, headache or mild pain.   Past Week   ondansetron (ZOFRAN) 4 MG tablet Take 4 mg by mouth every 8 (eight) hours as needed for nausea or vomiting.      OXBRYTA 500 MG TABS tablet Take 1,500 mg by mouth daily in the afternoon.   01/01/2022   oxycodone (ROXICODONE) 30 MG immediate release tablet Take 30 mg by mouth every 4  (four) hours.   01/02/2022    Allergies: Patient has no known allergies. Past Medical History:  Diagnosis Date   Acute kidney injury (HCC) 11/07/2019   Anemia    Chronic pain    COVID-19 virus detected 10/22/2019   Depression    HCAP (healthcare-associated pneumonia) 11/07/2019   History of blood transfusion 2013   x 2   HSV infection    Hypoxia    Pneumonia    x 1   Pulmonary edema 11/11/2019   Sepsis (HCC) 11/07/2019   Sickle cell anemia (HCC) 09/2021   gets them frequently   Tachycardia with heart rate 100-120 beats per minute    Vitamin B12 deficiency 12/2018   Vitamin D deficiency    Past Surgical History:  Procedure Laterality Date   CESAREAN SECTION  2013   x 1   TOOTH EXTRACTION N/A 10/18/2021   Procedure: DENTAL RESTORATION/EXTRACTIONS;  Surgeon: Ocie Doyne, DMD;  Location: [facility] OR;  Service: Oral Surgery;  Laterality: N/A;   Family History  Problem Relation Age of Onset   Hypertension Mother    Sickle cell trait Mother    Diabetes Father    Sickle cell trait Father    Social History   Socioeconomic History   Marital status: Single    Spouse name: Not on file   Number of children: Not on file   Years of education: Not on file   Highest education level: Not on file  Occupational History   Not on file  Tobacco Use   Smoking status: Never   Smokeless tobacco: Never  Vaping Use   Vaping Use: Never used  Substance and Sexual Activity   Alcohol use: Not Currently   Drug use: Never   Sexual activity: Yes    Birth control/protection: None  Other Topics Concern   Not on file  Social History Narrative   Not on file   Social Determinants of Health   Financial Resource Strain: Not on file  Food Insecurity: No Food Insecurity (01/04/2022)   Hunger Vital Sign    Worried About Running Out of Food in the Last Year: Never true    Ran Out of Food in the Last Year: Never true  Transportation Needs: No Transportation Needs (01/04/2022)   PRAPARE -  Hydrologist (Medical): No    Lack of Transportation (Non-Medical): No  Physical Activity: Not on file  Stress: Not on file  Social Connections: Not on file  Intimate Partner Violence: Not At Risk (01/04/2022)   Humiliation, Afraid, Rape, and Kick questionnaire    Fear of Current or Ex-Partner: No    Emotionally Abused: No    Physically Abused: No    Sexually Abused: No   Review of Systems  Constitutional:  Negative for chills and fever.  HENT: Negative.    Respiratory: Negative.    Cardiovascular: Negative.   Gastrointestinal: Negative.   Genitourinary: Negative.   Musculoskeletal:  Positive for back pain and joint pain.  Skin: Negative.   Neurological: Negative.   Psychiatric/Behavioral: Negative.      Physical Exam: Blood pressure 124/76, pulse 99, temperature 98.4 F (36.9 C), temperature source Temporal, resp. rate 16, last menstrual period 12/23/2021, SpO2 95 %. Physical Exam Constitutional:      Appearance: Normal appearance. She is obese.  Eyes:     Pupils: Pupils are equal, round, and reactive to light.  Cardiovascular:     Rate and Rhythm: Normal rate and regular rhythm.     Pulses: Normal pulses.  Pulmonary:     Effort: Pulmonary effort is normal.  Abdominal:     General: Bowel sounds are normal.  Musculoskeletal:        General: Normal range of motion.  Skin:    General: Skin is warm.  Neurological:     General: No focal deficit present.     Mental Status: She is alert. Mental status is at baseline.  Psychiatric:        Mood and Affect: Mood normal.        Behavior: Behavior normal.        Thought Content: Thought content normal.        Judgment: Judgment normal.      Lab results: No results found for this or any previous visit (from the past 24 hour(s)).  Imaging results:  No results found.   Assessment & Plan: Patient admitted to sickle cell day infusion center for management of pain crisis.  Patient is opiate  tolerant Initiate IV dilaudid PCA. IV fluids, 0.45% saline at 100 ml/hr Toradol 15 mg IV times one dose Tylenol 1000 mg by mouth times one dose Review CBC with differential, complete metabolic panel, and reticulocytes as results become available. Pain intensity will be reevaluated in context of functioning and relationship to baseline as care progresses If pain intensity remains elevated and/or sudden change in hemodynamic stability transition to inpatient services for higher level of care.  Nolon Nations  APRN, MSN, FNP-C Patient Care [facility] [address] [place], [place] [address] [phone]  01/04/2022, 9:31 AM

## 2022-01-04 NOTE — Progress Notes (Signed)
Patient ID: Stephanie Burch, female   DOB: 03-05-1980, 41 y.o.   MRN: [id number] Subjective: [name] is a 41 year old female with a medical history significant for sickle cell disease, chronic pain syndrome, opiate dependence and tolerance, history of anemia of chronic disease, and history of morbid obesity was admitted for sickle cell pain crisis.   She has no new complaint today, she said her pain is slowly improving.  She rates her pain as 7/10 as against 10/10 when she came in yesterday.  She denies any fever, cough, chest pain, shortness of breath, nausea, vomiting or diarrhea.  No urinary symptoms.  Objective:  Vital signs in last 24 hours:  Vitals:   01/04/22 0429 01/04/22 0800 01/04/22 0950 01/04/22 0957  BP: 135/86  118/82   Pulse: 97  99   Resp: 16 16 18 18   Temp: 98.4 F (36.9 C)  98 F (36.7 C)   TempSrc: Oral  Oral   SpO2: 100% 100% 98% 98%  Weight:      Height:        Intake/Output from previous day:   Intake/Output Summary (Last 24 hours) at 01/04/2022 1129 Last data filed at 01/04/2022 0930 Gross per 24 hour  Intake 775.97 ml  Output --  Net 775.97 ml    Physical Exam: General: Alert, awake, oriented x3, in no acute distress.  Morbidly obese. HEENT: [place]/AT PEERL, EOMI Neck: Trachea midline,  no masses, no thyromegal,y no JVD, no carotid bruit OROPHARYNX:  Moist, No exudate/ erythema/lesions.  Heart: Regular rate and rhythm, without murmurs, rubs, gallops, PMI non-displaced, no heaves or thrills on palpation.  Lungs: Clear to auscultation, no wheezing or rhonchi noted. No increased vocal fremitus resonant to percussion  Abdomen: Soft, nontender, nondistended, positive bowel sounds, no masses no hepatosplenomegaly noted..  Neuro: No focal neurological deficits noted cranial nerves II through XII grossly intact. DTRs 2+ bilaterally upper and lower extremities. Strength 5 out of 5 in bilateral upper and lower extremities. Musculoskeletal: No warm swelling or  erythema around joints, no spinal tenderness noted. Psychiatric: Patient alert and oriented x3, good insight and cognition, good recent to remote recall. Lymph node survey: No cervical axillary or inguinal lymphadenopathy noted.  Lab Results:  Basic Metabolic Panel:    Component Value Date/Time   NA 141 01/03/2022 1021   NA 142 09/11/2017 1431   K 4.2 01/03/2022 1021   CL 109 01/03/2022 1021   CO2 25 01/03/2022 1021   BUN 7 01/03/2022 1021   BUN 8 09/11/2017 1431   CREATININE 0.66 01/03/2022 1021   GLUCOSE 96 01/03/2022 1021   CALCIUM 9.6 01/03/2022 1021   CBC:    Component Value Date/Time   WBC 8.8 01/03/2022 1021   HGB 13.3 01/03/2022 1021   HGB 13.0 09/11/2017 1431   HCT 38.7 01/03/2022 1021   HCT 38.4 09/11/2017 1431   PLT 284 01/03/2022 1021   PLT 258 09/11/2017 1431   MCV 70.1 (L) 01/03/2022 1021   MCV 75 (L) 09/11/2017 1431   NEUTROABS 5.9 01/03/2022 1021   NEUTROABS 3.9 09/11/2017 1431   LYMPHSABS 2.1 01/03/2022 1021   LYMPHSABS 2.2 09/11/2017 1431   MONOABS 0.5 01/03/2022 1021   EOSABS 0.1 01/03/2022 1021   EOSABS 0.1 09/11/2017 1431   BASOSABS 0.0 01/03/2022 1021   BASOSABS 0.0 09/11/2017 1431    No results found for this or any previous visit (from the past 240 hour(s)).  Studies/Results: No results found.  Medications: Scheduled Meds:  enoxaparin (LOVENOX)  injection  40 mg Subcutaneous Q24H   folic acid  1 mg Oral Daily   HYDROmorphone   Intravenous Q4H   ketorolac  15 mg Intravenous Q6H   senna-docusate  1 tablet Oral BID   voxelotor  1,500 mg Oral Q1500   Continuous Infusions:  sodium chloride 75 mL/hr at 01/04/22 0930   PRN Meds:.acetaminophen, diphenhydrAMINE, naloxone **AND** sodium chloride flush, ondansetron (ZOFRAN) IV, oxycodone, polyethylene glycol  Consultants: None  Procedures: None  Antibiotics: None  Assessment/Plan: Principal Problem:   Sickle cell crisis (HCC) Active Problems:   BMI 50.0-59.9, adult (HCC)   Anemia  of chronic disease  Hb Sickle Cell Disease with Pain crisis: Reduce IV fluid to [facility], discontinue cardiac monitor, continue weight based Dilaudid PCA at current dose setting, will begin to wean of as pain control improves.  Continue IV Toradol 15 mg Q 6 H for total of 5 days, restart and continue oral home pain medications as ordered.  Monitor vitals very closely, Re-evaluate pain scale regularly, 2 L of Oxygen by [place]. Anemia of Chronic Disease: Hemoglobin is 13.3 consistent with her baseline.  There is no clinical indication for blood transfusion at this time.  We will continue to monitor very closely and transfuse as appropriate. Chronic pain Syndrome: Restart and continue oral home pain medications. Morbid obesity: Patient counseled extensively on diet and regular physical exercise as tolerated.  Code Status: Full Code Family Communication: N/A Disposition Plan: Not yet ready for discharge  Daemyn Gariepy  If 7PM-7AM, please contact night-coverage.  01/04/2022, 11:29 AM  LOS: 0 days

## 2022-01-05 DIAGNOSIS — D5700 Hb-SS disease with crisis, unspecified: Secondary | ICD-10-CM | POA: Diagnosis not present

## 2022-01-05 NOTE — Progress Notes (Signed)
SICKLE CELL SERVICE PROGRESS NOTE  Stephanie Burch JIR:[id number] DOB: 1980-07-31 DOA: 01/03/2022 PCP: Patient, No Pcp Per  Assessment/Plan: Principal Problem:   Sickle cell crisis (HCC) Active Problems:   BMI 50.0-59.9, adult (HCC)   Anemia of chronic disease  Sickle cell pain crisis: Patient is on Dilaudid PCA with Toradol and oral oxycodone.  Pain is said 7 out of 10 at the moment.  She is still using the Dilaudid PCA.  Initial plan is to hopefully discharge patient home today but still having adequate pain.  Will likely discharge when she feels better. Anemia of chronic disease: Monitor H&H. Morbid obesity: Dietary counseling  Code Status: Full code Family Communication: No family at bedside Disposition Plan: Home  [facility]  Pager [phone]. If 7PM-7AM, please contact night-coverage.  01/05/2022, 11:29 AM  LOS: 1 day   Brief narrative: Stephanie Burch is a 41 y.o. female with medical history significant of sickle cell disease, chronic pain syndrome, opiate dependence and tolerance, and history of anemia of chronic disease who presents for sickle cell crisis.    Pt has been seen in the ED and admitted numerous time in the past month. She was just seen in sickle cell clinic yesterday but feels her pain was not adequately controlled. Takes oxycodone and Dilaudid at home without relieve.  Has pain around bilateral arms and legs.  Denies chest pain or shortness of breath. State her condition has been worsening over the past 3 years. She determine to try other methods such as therapy to help with her pain.   Consultants: none  Procedures: none  Antibiotics: none  HPI/Subjective: Patient is still complaining of 7 out of 10 pain.  She is still helps she will get better enough to be able to go home when her pain is less than 5 out of 10.  Objective: Vitals:   01/05/22 0505 01/05/22 0509 01/05/22 0837 01/05/22 1002  BP:  114/76  (!) 139/92  Pulse:  (!) 116  (!) 113  Resp:  16 18 18 18   Temp:  99 F (37.2 C)  98.4 F (36.9 C)  TempSrc:  Oral  Oral  SpO2: 92% 92% 96% 91%  Weight:      Height:       Weight change:   Intake/Output Summary (Last 24 hours) at 01/05/2022 1129 Last data filed at 01/05/2022 14/04/2021 Gross per 24 hour  Intake 272.5 ml  Output --  Net 272.5 ml    General: Alert, awake, oriented x3, in no acute distress.  HEENT: [place]/AT PEERL, EOMI Neck: Trachea midline,  no masses, no thyromegal,y no JVD, no carotid bruit OROPHARYNX:  Moist, No exudate/ erythema/lesions.  Heart: Regular rate and rhythm, without murmurs, rubs, gallops, PMI non-displaced, no heaves or thrills on palpation.  Lungs: Clear to auscultation, no wheezing or rhonchi noted. No increased vocal fremitus resonant to percussion  Abdomen: Soft, nontender, nondistended, positive bowel sounds, no masses no hepatosplenomegaly noted..  Neuro: No focal neurological deficits noted cranial nerves II through XII grossly intact. DTRs 2+ bilaterally upper and lower extremities. Strength 5 out of 5 in bilateral upper and lower extremities. Musculoskeletal: No warm swelling or erythema around joints, no spinal tenderness noted. Psychiatric: Patient alert and oriented x3, good insight and cognition, good recent to remote recall. Lymph node survey: No cervical axillary or inguinal lymphadenopathy noted.   Data Reviewed: Basic Metabolic Panel: Recent Labs  Lab 01/02/22 1045 01/03/22 1021  NA 141 141  K 3.9 4.2  CL 109  109  CO2 25 25  GLUCOSE 122* 96  BUN 8 7  CREATININE 0.59 0.66  CALCIUM 9.2 9.6   Liver Function Tests: Recent Labs  Lab 01/02/22 1045 01/03/22 1021  AST 15 13*  ALT 19 17  ALKPHOS 69 71  BILITOT 0.6 0.8  PROT 7.7 7.8  ALBUMIN 4.1 3.7   No results for input(s): "LIPASE", "AMYLASE" in the last 168 hours. No results for input(s): "AMMONIA" in the last 168 hours. CBC: Recent Labs  Lab 01/02/22 1045 01/03/22 1021  WBC 9.1 8.8  NEUTROABS 6.5 5.9  HGB 12.8  13.3  HCT 37.7 38.7  MCV 70.6* 70.1*  PLT 285 284   Cardiac Enzymes: No results for input(s): "CKTOTAL", "CKMB", "CKMBINDEX", "TROPONINI" in the last 168 hours. BNP (last 3 results) Recent Labs    11/21/21 1403  BNP 17.5    ProBNP (last 3 results) No results for input(s): "PROBNP" in the last 8760 hours.  CBG: No results for input(s): "GLUCAP" in the last 168 hours.  No results found for this or any previous visit (from the past 240 hour(s)).   Studies: DG Chest Portable 1 View  Result Date: 12/07/2021 CLINICAL DATA:  Sickle cell anemia, pain EXAM: PORTABLE CHEST 1 VIEW COMPARISON:  11/21/2021 FINDINGS: Mild bilateral lower lobe atelectasis. No pleural effusion or pneumothorax. The heart is top-normal in size. IMPRESSION: Mild bilateral lower lobe atelectasis. Electronically Signed   By: Charline Bills M.D.   On: 12/07/2021 19:25    Scheduled Meds:  enoxaparin (LOVENOX) injection  40 mg Subcutaneous Q24H   folic acid  1 mg Oral Daily   HYDROmorphone   Intravenous Q4H   ketorolac  15 mg Intravenous Q6H   senna-docusate  1 tablet Oral BID   voxelotor  1,500 mg Oral Q1500   Continuous Infusions:  Principal Problem:   Sickle cell crisis (HCC) Active Problems:   BMI 50.0-59.9, adult (HCC)   Anemia of chronic disease

## 2022-01-06 DIAGNOSIS — D5700 Hb-SS disease with crisis, unspecified: Secondary | ICD-10-CM | POA: Diagnosis not present

## 2022-01-06 NOTE — Discharge Summary (Signed)
Physician Discharge Summary  Stephanie Burch EKC:[id number] DOB: July 07, 1980 DOA: 01/03/2022  PCP: Patient, No Pcp Per  Admit date: 01/03/2022  Discharge date: 01/06/2022  Discharge Diagnoses:  Principal Problem:   Sickle cell crisis (HCC) Active Problems:   BMI 50.0-59.9, adult (HCC)   Anemia of chronic disease   Discharge Condition: Stable  Disposition:   Follow-up Information     Quentin Angst, MD Follow up.   Specialty: Internal Medicine Contact information: [address] [place] [address] [phone]                Pt is discharged home in good condition and is to follow up to establish care this week to have labs evaluated. Stephanie Burch is instructed to increase activity slowly and balance with rest for the next few days, and use prescribed medication to complete treatment of pain  Diet: Regular Wt Readings from Last 3 Encounters:  01/03/22 (!) 145.2 kg  12/23/21 (!) 145.2 kg  12/08/21 (!) 146.7 kg    History of present illness:  Stephanie Burch is a 41 year old female with a medical history significant for sickle cell disease, chronic pain syndrome, opiate dependence and tolerance, and history of anemia of chronic disease that was admitted for sickle cell pain crisis. Patient has been seen in the ED and admitted numerous times in the past month.  She was just seen in sickle cell clinic on yesterday, but feels her pain was not adequately controlled.  Takes oxycodone and Dilaudid at home without relief.  Has pain around bilateral arms and legs.  Denies chest pain or shortness of breath.  States that her condition has been worsening over the past 3 days.  She is determined to try other methods such as therapy to help with her pain.  Hospital Course:  Sickle cell disease with pain crisis: Patient was admitted for sickle cell pain crisis and managed appropriately with IVF, IV Dilaudid via PCA and IV Toradol, as well as other adjunct therapies per  sickle cell pain management protocols.  Patient will transition to her home medications.  Her opiates are managed by hematology at [facility].  Patient's pain intensity is 4/10 and she is requesting discharge home today. Reviewed all laboratory values, unremarkable throughout admission. Patient inquiring about PCP.  Provided written information.  Patient is alert, oriented, and ambulating without assistance.  Patient was therefore discharged home today in a hemodynamically stable condition.   Stephanie Burch will follow-up with PCP within 1 week of this discharge. Stephanie Burch was counseled extensively about nonpharmacologic means of pain management, patient verbalized understanding and was appreciative of  the care received during this admission.   We discussed the need for good hydration, monitoring of hydration status, avoidance of heat, cold, stress, and infection triggers. We discussed the need to be adherent with taking Oxbryta and other home medications. Patient was reminded of the need to seek medical attention immediately if any symptom of bleeding, anemia, or infection occurs.  Discharge Exam: Vitals:   01/06/22 1034 01/06/22 1236  BP: 130/76   Pulse: (!) 103   Resp: 16 18  Temp: 98.1 F (36.7 C)   SpO2: 92% 93%   Vitals:   01/06/22 0445 01/06/22 0935 01/06/22 1034 01/06/22 1236  BP: 125/77  130/76   Pulse: (!) 107  (!) 103   Resp: 18 19 16 18   Temp: 98 F (36.7 C)  98.1 F (36.7 C)   TempSrc: Oral     SpO2: 93%  94% 92% 93%  Weight:      Height:        General appearance : Awake, alert, not in any distress. Speech Clear. Not toxic looking HEENT: Atraumatic and Normocephalic, pupils equally reactive to light and accomodation Neck: Supple, no JVD. No cervical lymphadenopathy.  Chest: Good air entry bilaterally, no added sounds  CVS: S1 S2 regular, no murmurs.  Abdomen: Bowel sounds present, Non tender and not distended with no gaurding, rigidity or rebound. Extremities: B/L  Lower Ext shows no edema, both legs are warm to touch Neurology: Awake alert, and oriented X 3, CN II-XII intact, Non focal Skin: No Rash  Discharge Instructions  Discharge Instructions     Discharge patient   Complete by: As directed    Discharge disposition: 01-Home or Self Care   Discharge patient date: 01/06/2022      Allergies as of 01/06/2022   No Known Allergies      Medication List     TAKE these medications    acetaminophen 500 MG tablet Commonly known as: TYLENOL Take 1,000 mg by mouth every 6 (six) hours as needed for mild pain, moderate pain or headache.   folic acid 1 MG tablet Commonly known as: FOLVITE Take 1 mg by mouth daily.   HYDROmorphone 4 MG tablet Commonly known as: DILAUDID Take 4 mg by mouth every 6 (six) hours.   ibuprofen 200 MG tablet Commonly known as: ADVIL Take 800 mg by mouth 3 (three) times daily as needed for fever, headache or mild pain.   ondansetron 4 MG tablet Commonly known as: ZOFRAN Take 4 mg by mouth every 8 (eight) hours as needed for nausea or vomiting.   Oxbryta 500 MG Tabs tablet Generic drug: voxelotor Take 1,500 mg by mouth daily in the afternoon.   oxycodone 30 MG immediate release tablet Commonly known as: ROXICODONE Take 30 mg by mouth every 4 (four) hours.        The results of significant diagnostics from this hospitalization (including imaging, microbiology, ancillary and laboratory) are listed below for reference.    Significant Diagnostic Studies: DG Chest Portable 1 View  Result Date: 12/07/2021 CLINICAL DATA:  Sickle cell anemia, pain EXAM: PORTABLE CHEST 1 VIEW COMPARISON:  11/21/2021 FINDINGS: Mild bilateral lower lobe atelectasis. No pleural effusion or pneumothorax. The heart is top-normal in size. IMPRESSION: Mild bilateral lower lobe atelectasis. Electronically Signed   By: Julian Hy M.D.   On: 12/07/2021 19:25    Microbiology: No results found for this or any previous visit (from  the past 240 hour(s)).   Labs: Basic Metabolic Panel: Recent Labs  Lab 01/02/22 1045 01/03/22 1021  NA 141 141  K 3.9 4.2  CL 109 109  CO2 25 25  GLUCOSE 122* 96  BUN 8 7  CREATININE 0.59 0.66  CALCIUM 9.2 9.6   Liver Function Tests: Recent Labs  Lab 01/02/22 1045 01/03/22 1021  AST 15 13*  ALT 19 17  ALKPHOS 69 71  BILITOT 0.6 0.8  PROT 7.7 7.8  ALBUMIN 4.1 3.7   No results for input(s): "LIPASE", "AMYLASE" in the last 168 hours. No results for input(s): "AMMONIA" in the last 168 hours. CBC: Recent Labs  Lab 01/02/22 1045 01/03/22 1021  WBC 9.1 8.8  NEUTROABS 6.5 5.9  HGB 12.8 13.3  HCT 37.7 38.7  MCV 70.6* 70.1*  PLT 285 284   Cardiac Enzymes: No results for input(s): "CKTOTAL", "CKMB", "CKMBINDEX", "TROPONINI" in the last 168 hours. BNP: Invalid input(s): "  POCBNP" CBG: No results for input(s): "GLUCAP" in the last 168 hours.  Time coordinating discharge: 30 minutes  Signed:  Nolon Nations  APRN, MSN, FNP-C Patient Care [facility] [address] [place], [place] [address] [phone]  [facility] 01/06/2022, 1:20 PM

## 2022-01-06 NOTE — Progress Notes (Signed)
Won't let me finish charting on patient.

## 2022-01-15 ENCOUNTER — Non-Acute Institutional Stay (HOSPITAL_BASED_OUTPATIENT_CLINIC_OR_DEPARTMENT_OTHER)
Admission: AD | Admit: 2022-01-15 | Discharge: 2022-01-15 | Disposition: A | Discharge status: Home / Self Care | Payer: Medicare Other | Source: Home / Self Care | Attending: Internal Medicine | Admitting: Internal Medicine

## 2022-01-15 ENCOUNTER — Telehealth (HOSPITAL_COMMUNITY): Payer: Self-pay

## 2022-01-15 ENCOUNTER — Encounter (HOSPITAL_COMMUNITY): Payer: Self-pay | Admitting: Family Medicine

## 2022-01-15 DIAGNOSIS — D5700 Hb-SS disease with crisis, unspecified: Secondary | ICD-10-CM | POA: Insufficient documentation

## 2022-01-15 DIAGNOSIS — D57219 Sickle-cell/Hb-C disease with crisis, unspecified: Secondary | ICD-10-CM | POA: Diagnosis not present

## 2022-01-15 DIAGNOSIS — D638 Anemia in other chronic diseases classified elsewhere: Secondary | ICD-10-CM | POA: Insufficient documentation

## 2022-01-15 DIAGNOSIS — G894 Chronic pain syndrome: Secondary | ICD-10-CM | POA: Insufficient documentation

## 2022-01-15 DIAGNOSIS — F1120 Opioid dependence, uncomplicated: Secondary | ICD-10-CM | POA: Insufficient documentation

## 2022-01-15 LAB — CBC WITH DIFFERENTIAL/PLATELET
Abs Immature Granulocytes: 0.01 10*3/uL (ref 0.00–0.07)
Basophils Absolute: 0 10*3/uL (ref 0.0–0.1)
Basophils Relative: 0 %
Eosinophils Absolute: 0.2 10*3/uL (ref 0.0–0.5)
Eosinophils Relative: 2 %
HCT: 37.2 % (ref 36.0–46.0)
Hemoglobin: 12.6 g/dL (ref 12.0–15.0)
Immature Granulocytes: 0 %
Lymphocytes Relative: 23 %
Lymphs Abs: 1.8 10*3/uL (ref 0.7–4.0)
MCH: 24.2 pg — ABNORMAL LOW (ref 26.0–34.0)
MCHC: 33.9 g/dL (ref 30.0–36.0)
MCV: 71.5 fL — ABNORMAL LOW (ref 80.0–100.0)
Monocytes Absolute: 0.6 10*3/uL (ref 0.1–1.0)
Monocytes Relative: 8 %
Neutro Abs: 5.1 10*3/uL (ref 1.7–7.7)
Neutrophils Relative %: 67 %
Platelets: 279 10*3/uL (ref 150–400)
RBC: 5.2 MIL/uL — ABNORMAL HIGH (ref 3.87–5.11)
RDW: 16 % — ABNORMAL HIGH (ref 11.5–15.5)
WBC: 7.7 10*3/uL (ref 4.0–10.5)
nRBC: 0.8 % — ABNORMAL HIGH (ref 0.0–0.2)

## 2022-01-15 LAB — COMPREHENSIVE METABOLIC PANEL
ALT: 13 U/L (ref 0–44)
AST: 19 U/L (ref 15–41)
Albumin: 3.7 g/dL (ref 3.5–5.0)
Alkaline Phosphatase: 64 U/L (ref 38–126)
Anion gap: 7 (ref 5–15)
BUN: 7 mg/dL (ref 6–20)
CO2: 22 mmol/L (ref 22–32)
Calcium: 9.1 mg/dL (ref 8.9–10.3)
Chloride: 112 mmol/L — ABNORMAL HIGH (ref 98–111)
Creatinine, Ser: 0.66 mg/dL (ref 0.44–1.00)
GFR, Estimated: >60 mL/min (ref >60)
Glucose, Bld: 114 mg/dL — ABNORMAL HIGH (ref 70–99)
Potassium: 4.5 mmol/L (ref 3.5–5.1)
Sodium: 141 mmol/L (ref 135–145)
Total Bilirubin: 0.8 mg/dL (ref 0.3–1.2)
Total Protein: 7.3 g/dL (ref 6.5–8.1)

## 2022-01-15 LAB — RETICULOCYTES
Immature Retic Fract: 30.8 % — ABNORMAL HIGH (ref 2.3–15.9)
RBC.: 5.2 MIL/uL — ABNORMAL HIGH (ref 3.87–5.11)
Retic Count, Absolute: 247.5 10*3/uL — ABNORMAL HIGH (ref 19.0–186.0)
Retic Ct Pct: 4.8 % — ABNORMAL HIGH (ref 0.4–3.1)

## 2022-01-15 MED ORDER — KETOROLAC TROMETHAMINE 30 MG/ML IJ SOLN
15.0000 mg | Freq: Once | INTRAMUSCULAR | Status: AC
  Administered 2022-01-15: 15 mg via INTRAVENOUS
  Filled 2022-01-15: qty 1

## 2022-01-15 MED ORDER — SODIUM CHLORIDE 0.9% FLUSH: 9.0000 mL | INTRAVENOUS | Status: DC | PRN

## 2022-01-15 MED ORDER — DIPHENHYDRAMINE HCL 25 MG PO CAPS
25.0000 mg | ORAL_CAPSULE | ORAL | Status: DC | PRN
  Administered 2022-01-15: 25 mg via ORAL
  Filled 2022-01-15: qty 1

## 2022-01-15 MED ORDER — HYDROMORPHONE 1 MG/ML IV SOLN
INTRAVENOUS | Status: DC
  Administered 2022-01-15: 30 mg via INTRAVENOUS
  Administered 2022-01-15: 14.5 mg via INTRAVENOUS
  Filled 2022-01-15: qty 30

## 2022-01-15 MED ORDER — SODIUM CHLORIDE 0.45 % IV SOLN: INTRAVENOUS | Status: DC

## 2022-01-15 MED ORDER — NALOXONE HCL 0.4 MG/ML IJ SOLN: 0.4000 mg | INTRAMUSCULAR | Status: DC | PRN

## 2022-01-15 MED ORDER — ACETAMINOPHEN 500 MG PO TABS
1000.0000 mg | ORAL_TABLET | Freq: Once | ORAL | Status: AC
  Administered 2022-01-15: 1000 mg via ORAL
  Filled 2022-01-15: qty 2

## 2022-01-15 MED ORDER — ONDANSETRON HCL 4 MG/2ML IJ SOLN: 4.0000 mg | Freq: Four times a day (QID) | INTRAMUSCULAR | Status: DC | PRN

## 2022-01-15 NOTE — Discharge Summary (Signed)
[facility] Discharge Summary   Patient ID: [name] MRN: [id number] DOB/AGE: December 06, 1980 41 y.o.  Admit date: 01/15/2022 Discharge date: 01/15/2022  Primary Care Physician:  Patient, No Pcp Per  Admission Diagnoses:  Active Problems:   Sickle cell pain crisis [facility])   Discharge Medications:  Allergies as of 01/15/2022   No Known Allergies      Medication List     TAKE these medications    acetaminophen 500 MG tablet Commonly known as: TYLENOL Take 1,000 mg by mouth every 6 (six) hours as needed for mild pain, moderate pain or headache.   folic acid 1 MG tablet Commonly known as: FOLVITE Take 1 mg by mouth daily.   HYDROmorphone 4 MG tablet Commonly known as: DILAUDID Take 4 mg by mouth every 6 (six) hours.   ibuprofen 200 MG tablet Commonly known as: ADVIL Take 800 mg by mouth 3 (three) times daily as needed for fever, headache or mild pain.   ondansetron 4 MG tablet Commonly known as: ZOFRAN Take 4 mg by mouth every 8 (eight) hours as needed for nausea or vomiting.   Oxbryta 500 MG Tabs tablet Generic drug: voxelotor Take 1,500 mg by mouth daily in the afternoon.   oxycodone 30 MG immediate release tablet Commonly known as: ROXICODONE Take 30 mg by mouth every 4 (four) hours as needed for pain.         Consults:  None  Significant Diagnostic Studies:  No results found. History of Present Illness: Stephanie Burch is a 41 year old female with a medical history significant for sickle cell disease, chronic pain syndrome, opiate dependence and tolerance anemia of chronic disease, morbid obesity presents with complaints of pain to low back and lower extremities that is consistent with her typical pain crisis.  Patient has had frequent admissions over the past several months due to chronic pain exacerbation.  She says that pain intensity has been elevated despite home medications.  Home medications including hydromorphone and oxycodone  were last taken this a.m. without sustained relief.  Patient rates pain as 9/10, constant, and throbbing.  Denies any fever, chills, chest pain, or shortness of breath.  No urinary symptoms, nausea, vomiting, or diarrhea.  No sick contacts, recent travel, or known [place] to COVID-19  [facility] Course: Patient admitted to sickle cell day infusion clinic for management of pain crisis. Pain managed with IV Dilaudid PCA, IV fluids, IV Toradol, and Tylenol. Pain intensity improved minimally.  Patient will be discharged home today.  Patient is alert, oriented, and ambulating without assistance.  Will discharge home in a hemodynamically stable condition.  Discharge instructions: Resume all home medications.   Follow up with PCP as previously  scheduled.   Discussed the importance of drinking 64 ounces of water daily, dehydration of red blood cells may lead further sickling.   Avoid all stressors that precipitate sickle cell pain crisis.     The patient was given clear instructions to go to ER or return to medical center if symptoms do not improve, worsen or new problems develop.    Physical Exam at Discharge:  BP (!) 152/90 (BP Location: Left Arm)   Pulse 90   Temp 98.3 F (36.8 C) (Oral)   Resp 15   LMP 12/31/2021 (Exact Date)   SpO2 95%   Physical Exam Constitutional:      Appearance: Normal appearance.  Eyes:     Pupils: Pupils are equal, round, and reactive to light.  Cardiovascular:     Rate and Rhythm: Normal rate and regular rhythm.     Pulses: Normal pulses.  Pulmonary:     Effort: Pulmonary effort is normal.  Abdominal:     General: Bowel sounds are normal.  Musculoskeletal:        General: Normal range of motion.  Skin:    General: Skin is warm.  Neurological:     General: No focal deficit present.     Mental Status: She is alert. Mental status is at baseline.  Psychiatric:        Mood and Affect: Mood normal.        Behavior: Behavior normal.         Thought Content: Thought content normal.        Judgment: Judgment normal.       Disposition at Discharge: Discharge disposition: 01-Home or Self Care       Discharge Orders: Discharge Instructions     Discharge patient   Complete by: As directed    Discharge disposition: 01-Home or Self Care   Discharge patient date: 01/15/2022       Condition at Discharge:   Stable  Time spent on Discharge:  Greater than 30 minutes.  Signed: Nolon Nations  APRN, MSN, FNP-C Patient Care [facility] [address] [place], [place] [address] [phone]  01/20/2022, 9:36 AM

## 2022-01-15 NOTE — H&P (Signed)
[facility] History and Physical   Date: 01/15/2022  Patient name: Stephanie Burch Medical record number: [id number] Date of birth: [birth year] Age: 41 y.o. Gender: female PCP: Patient, No Pcp Per  Attending physician: Quentin Angst, MD  Chief Complaint: Sickle cell pain  History of Present Illness: Stephanie Burch is a 41 year old female with a medical history significant for sickle cell disease, chronic pain syndrome, opiate dependence and tolerance anemia of chronic disease, morbid obesity presents with complaints of pain to low back and lower extremities that is consistent with her typical pain crisis.  Patient has had frequent admissions over the past several months due to chronic pain exacerbation.  She says that pain intensity has been elevated despite home medications.  Home medications including hydromorphone and oxycodone were last taken this a.m. without sustained relief.  Patient rates pain as 9/10, constant, and throbbing.  Denies any fever, chills, chest pain, or shortness of breath.  No urinary symptoms, nausea, vomiting, or diarrhea.  No sick contacts, recent travel, or known Editor, commissioning to COVID-19  Meds: Medications Prior to Admission  Medication Sig Dispense Refill Last Dose   acetaminophen (TYLENOL) 500 MG tablet Take 1,000 mg by mouth every 6 (six) hours as needed for mild pain, moderate pain or headache.      folic acid (FOLVITE) 1 MG tablet Take 1 mg by mouth daily.      HYDROmorphone (DILAUDID) 4 MG tablet Take 4 mg by mouth every 6 (six) hours.      ibuprofen (ADVIL) 200 MG tablet Take 800 mg by mouth 3 (three) times daily as needed for fever, headache or mild pain.      ondansetron (ZOFRAN) 4 MG tablet Take 4 mg by mouth every 8 (eight) hours as needed for nausea or vomiting.      OXBRYTA 500 MG TABS tablet Take 1,500 mg by mouth daily in the afternoon.      oxycodone (ROXICODONE) 30 MG immediate release tablet Take 30 mg by mouth every 4 (four) hours.        Allergies: Patient has no known allergies. Past Medical History:  Diagnosis Date   Acute kidney injury (HCC) 11/07/2019   Anemia    Chronic pain    COVID-19 virus detected 10/22/2019   Depression    HCAP (healthcare-associated pneumonia) 11/07/2019   History of blood transfusion 2013   x 2   HSV infection    Hypoxia    Pneumonia    x 1   Pulmonary edema 11/11/2019   Sepsis (HCC) 11/07/2019   Sickle cell anemia (HCC) 09/2021   gets them frequently   Tachycardia with heart rate 100-120 beats per minute    Vitamin B12 deficiency 12/2018   Vitamin D deficiency    Past Surgical History:  Procedure Laterality Date   CESAREAN SECTION  2013   x 1   TOOTH EXTRACTION N/A 10/18/2021   Procedure: DENTAL RESTORATION/EXTRACTIONS;  Surgeon: Ocie Doyne, DMD;  Location: [facility] OR;  Service: Oral Surgery;  Laterality: N/A;   Family History  Problem Relation Age of Onset   Hypertension Mother    Sickle cell trait Mother    Diabetes Father    Sickle cell trait Father    Social History   Socioeconomic History   Marital status: Single    Spouse name: Not on file   Number of children: Not on file   Years of education: Not on file   Highest education level: Not on file  Occupational History   Not on file  Tobacco Use   Smoking status: Never   Smokeless tobacco: Never  Vaping Use   Vaping Use: Never used  Substance and Sexual Activity   Alcohol use: Not Currently   Drug use: Never   Sexual activity: Yes    Birth control/protection: None  Other Topics Concern   Not on file  Social History Narrative   Not on file   Social Determinants of Health   Financial Resource Strain: Not on file  Food Insecurity: No Food Insecurity (01/04/2022)   Hunger Vital Sign    Worried About Running Out of Food in the Last Year: Never true    Ran Out of Food in the Last Year: Never true  Transportation Needs: No Transportation Needs (01/04/2022)   PRAPARE - Scientist, research (physical sciences) (Medical): No    Lack of Transportation (Non-Medical): No  Physical Activity: Not on file  Stress: Not on file  Social Connections: Not on file  Intimate Partner Violence: Not At Risk (01/04/2022)   Humiliation, Afraid, Rape, and Kick questionnaire    Fear of Current or Ex-Partner: No    Emotionally Abused: No    Physically Abused: No    Sexually Abused: No   Review of Systems  Constitutional: Negative.   HENT: Negative.    Eyes: Negative.   Respiratory: Negative.    Cardiovascular: Negative.   Gastrointestinal: Negative.   Genitourinary: Negative.   Musculoskeletal:  Positive for back pain and joint pain.  Neurological: Negative.   Psychiatric/Behavioral: Negative.      Physical Exam: Blood pressure 138/82, pulse (!) 103, temperature 98 F (36.7 C), temperature source Temporal, resp. rate 16, last menstrual period 12/23/2021. Physical Exam Constitutional:      Appearance: She is obese.  Cardiovascular:     Rate and Rhythm: Normal rate and regular rhythm.     Pulses: Normal pulses.  Pulmonary:     Effort: Pulmonary effort is normal.  Abdominal:     General: Abdomen is flat. Bowel sounds are normal.  Musculoskeletal:        General: Normal range of motion.  Skin:    General: Skin is warm.  Neurological:     General: No focal deficit present.     Mental Status: She is alert. Mental status is at baseline.      Lab results: No results found for this or any previous visit (from the past 24 hour(s)).  Imaging results:  No results found.   Assessment & Plan: Patient admitted to sickle cell day infusion center for management of pain crisis.  Patient is opiate tolerant Initiate IV dilaudid PCA.  IV fluids, 0.45% saline at 100 ml/hr Toradol 15 mg IV times one dose Tylenol 1000 mg by mouth times one dose Review CBC with differential, complete metabolic panel, and reticulocytes as results become available.  Pain intensity will be reevaluated in  context of functioning and relationship to baseline as care progresses If pain intensity remains elevated and/or sudden change in hemodynamic stability transition to inpatient services for higher level of care.     Nolon Nations  APRN, MSN, FNP-C Patient Care [facility] [address] [place], [place] [address] [phone]  01/15/2022, 9:33 AM

## 2022-01-15 NOTE — Progress Notes (Signed)
Patient admitted to the day hospital for treatment of sickle cell pain crisis. Patient reported pain rated 8/10 in the legs . Patient placed on Dilaudid PCA, given PO Tylenol, IV Toradol, PO Benadryl and hydrated with IV fluids. At discharge patient reported  pain at 5/10. Declined printed AVS. Alert, oriented and ambulatory at discharge.

## 2022-01-15 NOTE — Telephone Encounter (Signed)
Pt called day hospital looking to come in for sickle cell pain today. Pt reported 8/10 pain to bilateral legs. Pt reports taking Oxycodone 30 mg at 5 AM. Pt denies being to the Emergency room. Pt screened for COVID and denies symptoms and exposure. Pt denies fever, chest pain, abdominal pain, N/V/D. [place] Hollis, FNP notified and says that pt can come in today for treatment. Pt states that her friend will be her transportation to and from day hospital today.

## 2022-01-16 ENCOUNTER — Non-Acute Institutional Stay (HOSPITAL_BASED_OUTPATIENT_CLINIC_OR_DEPARTMENT_OTHER)
Admission: AD | Admit: 2022-01-16 | Discharge: 2022-01-16 | Disposition: A | Discharge status: Home / Self Care | Payer: Medicare Other | Source: Ambulatory Visit | Attending: Internal Medicine | Admitting: Internal Medicine

## 2022-01-16 ENCOUNTER — Telehealth (HOSPITAL_COMMUNITY): Payer: Self-pay

## 2022-01-16 DIAGNOSIS — G894 Chronic pain syndrome: Secondary | ICD-10-CM | POA: Insufficient documentation

## 2022-01-16 DIAGNOSIS — D5700 Hb-SS disease with crisis, unspecified: Secondary | ICD-10-CM | POA: Diagnosis not present

## 2022-01-16 DIAGNOSIS — M79606 Pain in leg, unspecified: Secondary | ICD-10-CM | POA: Insufficient documentation

## 2022-01-16 DIAGNOSIS — E669 Obesity, unspecified: Secondary | ICD-10-CM | POA: Insufficient documentation

## 2022-01-16 DIAGNOSIS — D57219 Sickle-cell/Hb-C disease with crisis, unspecified: Secondary | ICD-10-CM | POA: Insufficient documentation

## 2022-01-16 DIAGNOSIS — M25552 Pain in left hip: Secondary | ICD-10-CM | POA: Insufficient documentation

## 2022-01-16 DIAGNOSIS — Z832 Family history of diseases of the blood and blood-forming organs and certain disorders involving the immune mechanism: Secondary | ICD-10-CM | POA: Insufficient documentation

## 2022-01-16 DIAGNOSIS — M25551 Pain in right hip: Secondary | ICD-10-CM | POA: Insufficient documentation

## 2022-01-16 DIAGNOSIS — F1120 Opioid dependence, uncomplicated: Secondary | ICD-10-CM | POA: Insufficient documentation

## 2022-01-16 MED ORDER — KETOROLAC TROMETHAMINE 30 MG/ML IJ SOLN
15.0000 mg | Freq: Once | INTRAMUSCULAR | Status: AC
  Administered 2022-01-16: 15 mg via INTRAVENOUS
  Filled 2022-01-16: qty 1

## 2022-01-16 MED ORDER — DIPHENHYDRAMINE HCL 25 MG PO CAPS: 25.0000 mg | ORAL_CAPSULE | ORAL | Status: DC | PRN

## 2022-01-16 MED ORDER — SODIUM CHLORIDE 0.9% FLUSH: 9.0000 mL | INTRAVENOUS | Status: DC | PRN

## 2022-01-16 MED ORDER — ACETAMINOPHEN 500 MG PO TABS
1000.0000 mg | ORAL_TABLET | Freq: Once | ORAL | Status: AC
  Administered 2022-01-16: 1000 mg via ORAL
  Filled 2022-01-16: qty 2

## 2022-01-16 MED ORDER — SODIUM CHLORIDE 0.45 % IV SOLN: INTRAVENOUS | Status: DC

## 2022-01-16 MED ORDER — NALOXONE HCL 0.4 MG/ML IJ SOLN: 0.4000 mg | INTRAMUSCULAR | Status: DC | PRN

## 2022-01-16 MED ORDER — ONDANSETRON HCL 4 MG/2ML IJ SOLN: 4.0000 mg | Freq: Four times a day (QID) | INTRAMUSCULAR | Status: DC | PRN

## 2022-01-16 MED ORDER — HYDROMORPHONE 1 MG/ML IV SOLN
INTRAVENOUS | Status: DC
  Administered 2022-01-16: 30 mg via INTRAVENOUS
  Administered 2022-01-16: 15.5 mg via INTRAVENOUS
  Filled 2022-01-16: qty 30

## 2022-01-16 NOTE — Telephone Encounter (Signed)
Patient called day hospital looking to come in for sickle cell pain. Pt reported 8/10 pain to bilateral legs. Pt reports taking Oxycodone 30 mg at 5 AM. Pt denies being to the Emergency Room. Pt screened for COVID and denies symptoms and exposure. Pt denies fever, chest pain, N/V/D, and abdominal pain. [place] Hollis, FNP notified and said that pt can come to day hospital today for treatment. Pt notified and verbalized understanding. Pt states that her friend will be her transportation today,

## 2022-01-16 NOTE — Progress Notes (Signed)
Patient admitted to the day infusion hospital for sickle cell pain crisis. Patient was treated in the day hospital yesterday for pain crisis. Initially, patient reported bilateral leg pain rated 8/10. For pain management, patient placed on Dilaudid PCA, given 15 mg IV Toradol, 1000 mg Tylenol and hydrated with IV fluids. At discharge, patient rated pain at 7/10. Vital signs stable. AVS offered but patient refused. Patient alert, oriented and ambulatory at discharge.

## 2022-01-16 NOTE — Discharge Summary (Signed)
[facility] Discharge Summary   Patient ID: Stephanie Burch MRN: [id number] DOB/AGE: [birth year] 41 y.o.  Admit date: 01/16/2022 Discharge date: 01/16/2022  Primary Care Physician:  Patient, No Pcp Per  Admission Diagnoses:  Active Problems:   Sickle cell pain crisis [facility])   Discharge Medications:  Allergies as of 01/16/2022   No Known Allergies      Medication List     TAKE these medications    acetaminophen 500 MG tablet Commonly known as: TYLENOL Take 1,000 mg by mouth every 6 (six) hours as needed for mild pain, moderate pain or headache.   folic acid 1 MG tablet Commonly known as: FOLVITE Take 1 mg by mouth daily.   HYDROmorphone 4 MG tablet Commonly known as: DILAUDID Take 4 mg by mouth every 6 (six) hours.   ibuprofen 200 MG tablet Commonly known as: ADVIL Take 800 mg by mouth 3 (three) times daily as needed for fever, headache or mild pain.   ondansetron 4 MG tablet Commonly known as: ZOFRAN Take 4 mg by mouth every 8 (eight) hours as needed for nausea or vomiting.   Oxbryta 500 MG Tabs tablet Generic drug: voxelotor Take 1,500 mg by mouth daily in the afternoon.   oxycodone 30 MG immediate release tablet Commonly known as: ROXICODONE Take 30 mg by mouth every 4 (four) hours.         Consults:  None  Significant Diagnostic Studies:  No results found.  History of present illness:  Stephanie Burch is a 41 year old female with a medical history significant for sickle cell disease type [place], chronic pain syndrome, obesity, opiate dependence and tolerance, and anemia of chronic disease presents with complaints of bilateral hip and lower extremity pain.  Patient was treated and evaluated for this problem at sickle cell day infusion clinic on 01/15/2022.  Patient returns today with the same complaints, she states that pain intensity increased upon returning home.  Pain persists despite oxycodone and hydromorphone, which were both taken  this a.m.  Patient rates pain as 9/10, constant, and aching.  Patient attributes frequent pain crises to changes in weather.  She has been presenting frequently over the past several months.  Patient denies headache, fever, chills, chest pain, or shortness of breath.  No urinary symptoms, nausea, vomiting, or diarrhea.  No sick contacts, recent travel, or known exposure to COVID-19.  [facility] Course: Patient was admitted to sickle cell day clinic for further management of her sickle cell crisis. Reviewed patient's laboratory values from 01/15/2022, did not warrant repeating on today. Pain was treated with IV Dilaudid PCA, IV Toradol, IV fluids, and Tylenol. Pain intensity decreased throughout admission.  Patient advised to resume her home medications.  Patient stated that she is not out of her home medications, but is requesting oral medications prior to discharge.  Request declined.  Patient alert, oriented, and ambulating without assistance. She was discharged home in a hemodynamically stable condition. Patient advised to follow-up with her hematology team as scheduled. Physical Exam at Discharge:  BP (!) 129/59 (BP Location: Left Arm)   Pulse 78   Temp 97.9 F (36.6 C) (Temporal)   Resp 16   LMP 12/31/2021 (Exact Date)   SpO2 96%   Physical Exam Constitutional:      Appearance: She is obese.  Eyes:     Pupils: Pupils are equal, round, and reactive to light.  Cardiovascular:     Rate and Rhythm: Normal rate and regular rhythm.  Pulses: Normal pulses.  Pulmonary:     Effort: Pulmonary effort is normal.  Abdominal:     General: Bowel sounds are normal.  Musculoskeletal:        General: Normal range of motion.  Skin:    General: Skin is warm.  Neurological:     General: No focal deficit present.     Mental Status: She is alert.  Psychiatric:        Mood and Affect: Mood normal.        Behavior: Behavior normal.        Thought Content: Thought content  normal.        Judgment: Judgment normal.      Disposition at Discharge: There are no questions and answers to display.        Discharge Orders:   Condition at Discharge:   Stable  Time spent on Discharge:  Greater than 30 minutes.  Signed: [name]  APRN, MSN, FNP-C Patient [facility] [address] [place], [place] [address] [phone]  01/16/2022, 3:54 PM

## 2022-01-16 NOTE — H&P (Signed)
[facility] History and Physical   Date: 01/16/2022  Patient name: Stephanie Burch Medical record number: [id number] Date of birth: 1980-11-09 Age: 41 y.o. Gender: female PCP: Patient, No Pcp Per  Attending physician: Tresa Garter, MD  Chief Complaint: Sickle cell pain  History of present illness:  Stephanie Burch is a 41 year old female with a medical history significant for sickle cell disease type [place], chronic pain syndrome, obesity, opiate dependence and tolerance, and anemia of chronic disease presents with complaints of bilateral hip and lower extremity pain.  Patient was treated and evaluated for this problem at sickle cell day infusion clinic on 01/15/2022.  Patient returns today with the same complaints, she states that pain intensity increased upon returning home.  Pain persists despite oxycodone and hydromorphone, which were both taken this a.m.  Patient rates pain as 9/10, constant, and aching.  Patient attributes frequent pain crises to changes in weather.  She has been presenting frequently over the past several months.  Patient denies headache, fever, chills, chest pain, or shortness of breath.  No urinary symptoms, nausea, vomiting, or diarrhea.  No sick contacts, recent travel, or known exposure to COVID-19. Meds: Medications Prior to Admission  Medication Sig Dispense Refill Last Dose   acetaminophen (TYLENOL) 500 MG tablet Take 1,000 mg by mouth every 6 (six) hours as needed for mild pain, moderate pain or headache.      folic acid (FOLVITE) 1 MG tablet Take 1 mg by mouth daily.      HYDROmorphone (DILAUDID) 4 MG tablet Take 4 mg by mouth every 6 (six) hours.      ibuprofen (ADVIL) 200 MG tablet Take 800 mg by mouth 3 (three) times daily as needed for fever, headache or mild pain.      ondansetron (ZOFRAN) 4 MG tablet Take 4 mg by mouth every 8 (eight) hours as needed for nausea or vomiting.      OXBRYTA 500 MG TABS tablet Take 1,500 mg by mouth daily in  the afternoon.      oxycodone (ROXICODONE) 30 MG immediate release tablet Take 30 mg by mouth every 4 (four) hours.       Allergies: Patient has no known allergies. Past Medical History:  Diagnosis Date   Acute kidney injury ([place]) 11/07/2019   Anemia    Chronic pain    COVID-19 virus detected 10/22/2019   Depression    HCAP (healthcare-associated pneumonia) 11/07/2019   History of blood transfusion 2013   x 2   HSV infection    Hypoxia    Pneumonia    x 1   Pulmonary edema 11/11/2019   Sepsis ([place]) 11/07/2019   Sickle cell anemia ([place]) 09/2021   gets them frequently   Tachycardia with heart rate 100-120 beats per minute    Vitamin B12 deficiency 12/2018   Vitamin D deficiency    Past Surgical History:  Procedure Laterality Date   CESAREAN SECTION  2013   x 1   TOOTH EXTRACTION N/A 10/18/2021   Procedure: DENTAL RESTORATION/EXTRACTIONS;  Surgeon: Diona Browner, DMD;  Location: [facility];  Service: Oral Surgery;  Laterality: N/A;   Family History  Problem Relation Age of Onset   Hypertension Mother    Sickle cell trait Mother    Diabetes Father    Sickle cell trait Father    Social History   Socioeconomic History   Marital status: Single    Spouse name: Not on file   Number of children: Not on file  Years of education: Not on file   Highest education level: Not on file  Occupational History   Not on file  Tobacco Use   Smoking status: Never   Smokeless tobacco: Never  Vaping Use   Vaping Use: Never used  Substance and Sexual Activity   Alcohol use: Not Currently   Drug use: Never   Sexual activity: Yes    Birth control/protection: None  Other Topics Concern   Not on file  Social History Narrative   Not on file   Social Determinants of Health   Financial Resource Strain: Not on file  Food Insecurity: No Food Insecurity (01/04/2022)   Hunger Vital Sign    Worried About Running Out of Food in the Last Year: Never true    Ran Out of Food in the Last Year:  Never true  Transportation Needs: No Transportation Needs (01/04/2022)   PRAPARE - Hydrologist (Medical): No    Lack of Transportation (Non-Medical): No  Physical Activity: Not on file  Stress: Not on file  Social Connections: Not on file  Intimate Partner Violence: Not At Risk (01/04/2022)   Humiliation, Afraid, Rape, and Kick questionnaire    Fear of Current or Ex-Partner: No    Emotionally Abused: No    Physically Abused: No    Sexually Abused: No   Review of Systems  Constitutional: Negative.   HENT: Negative.    Respiratory: Negative.    Cardiovascular: Negative.   Genitourinary: Negative.   Musculoskeletal:  Positive for back pain and joint pain.  Skin: Negative.   Neurological: Negative.   Psychiatric/Behavioral: Negative.      Physical Exam: Last menstrual period 12/31/2021. Physical Exam Constitutional:      Appearance: She is obese.  Eyes:     Pupils: Pupils are equal, round, and reactive to light.  Cardiovascular:     Rate and Rhythm: Normal rate and regular rhythm.  Pulmonary:     Effort: Pulmonary effort is normal.  Abdominal:     General: Bowel sounds are normal.  Musculoskeletal:        General: Normal range of motion.  Skin:    General: Skin is warm.  Neurological:     General: No focal deficit present.     Mental Status: Mental status is at baseline.  Psychiatric:        Mood and Affect: Mood normal.        Behavior: Behavior normal.        Thought Content: Thought content normal.        Judgment: Judgment normal.      Lab results: Results for orders placed or performed during the hospital encounter of 01/15/22 (from the past 24 hour(s))  Comprehensive metabolic panel     Status: Abnormal   Collection Time: 01/15/22  9:33 AM  Result Value Ref Range   Sodium 141 135 - 145 mmol/L   Potassium 4.5 3.5 - 5.1 mmol/L   Chloride 112 (H) 98 - 111 mmol/L   CO2 22 22 - 32 mmol/L   Glucose, Bld 114 (H) 70 - 99 mg/dL    BUN 7 6 - 20 mg/dL   Creatinine, Ser 0.66 0.44 - 1.00 mg/dL   Calcium 9.1 8.9 - 10.3 mg/dL   Total Protein 7.3 6.5 - 8.1 g/dL   Albumin 3.7 3.5 - 5.0 g/dL   AST 19 15 - 41 U/L   ALT 13 0 - 44 U/L   Alkaline Phosphatase 64 38 -  126 U/L   Total Bilirubin 0.8 0.3 - 1.2 mg/dL   GFR, Estimated >62 >86 mL/min   Anion gap 7 5 - 15  CBC with Differential     Status: Abnormal   Collection Time: 01/15/22  9:33 AM  Result Value Ref Range   WBC 7.7 4.0 - 10.5 K/uL   RBC 5.20 (H) 3.87 - 5.11 MIL/uL   Hemoglobin 12.6 12.0 - 15.0 g/dL   HCT 38.1 77.1 - 16.5 %   MCV 71.5 (L) 80.0 - 100.0 fL   MCH 24.2 (L) 26.0 - 34.0 pg   MCHC 33.9 30.0 - 36.0 g/dL   RDW 79.0 (H) 38.3 - 33.8 %   Platelets 279 150 - 400 K/uL   nRBC 0.8 (H) 0.0 - 0.2 %   Neutrophils Relative % 67 %   Neutro Abs 5.1 1.7 - 7.7 K/uL   Lymphocytes Relative 23 %   Lymphs Abs 1.8 0.7 - 4.0 K/uL   Monocytes Relative 8 %   Monocytes Absolute 0.6 0.1 - 1.0 K/uL   Eosinophils Relative 2 %   Eosinophils Absolute 0.2 0.0 - 0.5 K/uL   Basophils Relative 0 %   Basophils Absolute 0.0 0.0 - 0.1 K/uL   Immature Granulocytes 0 %   Abs Immature Granulocytes 0.01 0.00 - 0.07 K/uL  Reticulocytes     Status: Abnormal   Collection Time: 01/15/22  9:33 AM  Result Value Ref Range   Retic Ct Pct 4.8 (H) 0.4 - 3.1 %   RBC. 5.20 (H) 3.87 - 5.11 MIL/uL   Retic Count, Absolute 247.5 (H) 19.0 - 186.0 K/uL   Immature Retic Fract 30.8 (H) 2.3 - 15.9 %    Imaging results:  No results found.   Assessment & Plan: Patient admitted to sickle cell day infusion center for management of pain crisis.  Patient is opiate tolerant Initiate IV dilaudid PCA. IV fluids, 0.45% saline at 100 ml/hr Toradol 15 mg IV times one dose Tylenol 1000 mg by mouth times one dose Review CBC with differential, complete metabolic panel, and reticulocytes as results become available.  Pain intensity will be reevaluated in context of functioning and relationship to baseline as  care progresses If pain intensity remains elevated and/or sudden change in hemodynamic stability transition to inpatient services for higher level of care.   Nolon Nations  APRN, MSN, FNP-C Patient Care [facility] [address] [place], [place] [address] [phone]  01/16/2022, 9:17 AM

## 2022-01-17 ENCOUNTER — Encounter (HOSPITAL_COMMUNITY): Payer: Self-pay | Admitting: Emergency Medicine

## 2022-01-17 ENCOUNTER — Telehealth (HOSPITAL_COMMUNITY): Payer: Self-pay | Admitting: *Deleted

## 2022-01-17 ENCOUNTER — Other Ambulatory Visit: Payer: Self-pay

## 2022-01-17 ENCOUNTER — Inpatient Hospital Stay (HOSPITAL_COMMUNITY)
Admission: EM | Admit: 2022-01-17 | Discharge: 2022-01-20 | DRG: 812 | Disposition: A | Discharge status: Home / Self Care | Payer: Medicare Other | Attending: Internal Medicine | Admitting: Internal Medicine

## 2022-01-17 DIAGNOSIS — D5700 Hb-SS disease with crisis, unspecified: Secondary | ICD-10-CM | POA: Diagnosis present

## 2022-01-17 DIAGNOSIS — Z8616 Personal history of COVID-19: Secondary | ICD-10-CM | POA: Diagnosis not present

## 2022-01-17 DIAGNOSIS — G894 Chronic pain syndrome: Secondary | ICD-10-CM | POA: Diagnosis present

## 2022-01-17 DIAGNOSIS — D638 Anemia in other chronic diseases classified elsewhere: Secondary | ICD-10-CM | POA: Diagnosis present

## 2022-01-17 DIAGNOSIS — D57219 Sickle-cell/Hb-C disease with crisis, unspecified: Principal | ICD-10-CM | POA: Diagnosis present

## 2022-01-17 DIAGNOSIS — Z832 Family history of diseases of the blood and blood-forming organs and certain disorders involving the immune mechanism: Secondary | ICD-10-CM

## 2022-01-17 DIAGNOSIS — Z6841 Body Mass Index (BMI) 40.0 and over, adult: Secondary | ICD-10-CM | POA: Diagnosis not present

## 2022-01-17 DIAGNOSIS — Z79899 Other long term (current) drug therapy: Secondary | ICD-10-CM

## 2022-01-17 LAB — BASIC METABOLIC PANEL
Anion gap: 7 (ref 5–15)
BUN: 8 mg/dL (ref 6–20)
CO2: 22 mmol/L (ref 22–32)
Calcium: 9.5 mg/dL (ref 8.9–10.3)
Chloride: 111 mmol/L (ref 98–111)
Creatinine, Ser: 0.63 mg/dL (ref 0.44–1.00)
GFR, Estimated: >60 mL/min (ref >60)
Glucose, Bld: 111 mg/dL — ABNORMAL HIGH (ref 70–99)
Potassium: 3.8 mmol/L (ref 3.5–5.1)
Sodium: 140 mmol/L (ref 135–145)

## 2022-01-17 LAB — RETICULOCYTES
Immature Retic Fract: 26.3 % — ABNORMAL HIGH (ref 2.3–15.9)
RBC.: 5.28 MIL/uL — ABNORMAL HIGH (ref 3.87–5.11)
Retic Count, Absolute: 169 10*3/uL (ref 19.0–186.0)
Retic Ct Pct: 3.2 % — ABNORMAL HIGH (ref 0.4–3.1)

## 2022-01-17 LAB — CBC WITH DIFFERENTIAL/PLATELET
Abs Immature Granulocytes: 0.02 10*3/uL (ref 0.00–0.07)
Basophils Absolute: 0 10*3/uL (ref 0.0–0.1)
Basophils Relative: 0 %
Eosinophils Absolute: 0.2 10*3/uL (ref 0.0–0.5)
Eosinophils Relative: 2 %
HCT: 37.4 % (ref 36.0–46.0)
Hemoglobin: 12.7 g/dL (ref 12.0–15.0)
Immature Granulocytes: 0 %
Lymphocytes Relative: 19 %
Lymphs Abs: 1.8 10*3/uL (ref 0.7–4.0)
MCH: 24.1 pg — ABNORMAL LOW (ref 26.0–34.0)
MCHC: 34 g/dL (ref 30.0–36.0)
MCV: 71 fL — ABNORMAL LOW (ref 80.0–100.0)
Monocytes Absolute: 0.6 10*3/uL (ref 0.1–1.0)
Monocytes Relative: 6 %
Neutro Abs: 6.8 10*3/uL (ref 1.7–7.7)
Neutrophils Relative %: 73 %
Platelets: 270 10*3/uL (ref 150–400)
RBC: 5.27 MIL/uL — ABNORMAL HIGH (ref 3.87–5.11)
RDW: 15.6 % — ABNORMAL HIGH (ref 11.5–15.5)
WBC: 9.4 10*3/uL (ref 4.0–10.5)
nRBC: 0.3 % — ABNORMAL HIGH (ref 0.0–0.2)

## 2022-01-17 LAB — I-STAT BETA HCG BLOOD, ED (MC, WL, AP ONLY): I-stat hCG, quantitative: <5 m[IU]/mL (ref <5)

## 2022-01-17 MED ORDER — HYDROMORPHONE HCL 2 MG/ML IJ SOLN
2.0000 mg | INTRAMUSCULAR | Status: AC | PRN
  Administered 2022-01-17 (×4): 2 mg via INTRAVENOUS
  Filled 2022-01-17 (×4): qty 1

## 2022-01-17 MED ORDER — POLYETHYLENE GLYCOL 3350 17 G PO PACK: 17.0000 g | PACK | Freq: Every day | ORAL | Status: DC | PRN

## 2022-01-17 MED ORDER — ENOXAPARIN SODIUM 40 MG/0.4ML IJ SOSY
40.0000 mg | PREFILLED_SYRINGE | INTRAMUSCULAR | Status: DC
  Administered 2022-01-17 – 2022-01-19 (×3): 40 mg via SUBCUTANEOUS
  Filled 2022-01-17 (×3): qty 0.4

## 2022-01-17 MED ORDER — NALOXONE HCL 0.4 MG/ML IJ SOLN: 0.4000 mg | INTRAMUSCULAR | Status: DC | PRN

## 2022-01-17 MED ORDER — SENNOSIDES-DOCUSATE SODIUM 8.6-50 MG PO TABS
1.0000 | ORAL_TABLET | Freq: Two times a day (BID) | ORAL | Status: DC
  Filled 2022-01-17 (×5): qty 1

## 2022-01-17 MED ORDER — ONDANSETRON HCL 4 MG/2ML IJ SOLN
4.0000 mg | Freq: Four times a day (QID) | INTRAMUSCULAR | Status: DC | PRN
  Administered 2022-01-18 (×2): 4 mg via INTRAVENOUS
  Filled 2022-01-17 (×2): qty 2

## 2022-01-17 MED ORDER — SODIUM CHLORIDE 0.9% FLUSH: 9.0000 mL | INTRAVENOUS | Status: DC | PRN

## 2022-01-17 MED ORDER — HYDROMORPHONE HCL 2 MG/ML IJ SOLN
2.0000 mg | INTRAMUSCULAR | Status: AC
  Administered 2022-01-17: 2 mg via INTRAVENOUS
  Filled 2022-01-17: qty 1

## 2022-01-17 MED ORDER — VOXELOTOR 500 MG PO TABS
1500.0000 mg | ORAL_TABLET | Freq: Every day | ORAL | Status: DC
  Administered 2022-01-18 – 2022-01-19 (×2): 1500 mg via ORAL

## 2022-01-17 MED ORDER — KETOROLAC TROMETHAMINE 15 MG/ML IJ SOLN
15.0000 mg | INTRAMUSCULAR | Status: AC
  Administered 2022-01-17: 15 mg via INTRAVENOUS
  Filled 2022-01-17: qty 1

## 2022-01-17 MED ORDER — SODIUM CHLORIDE 0.45 % IV SOLN: INTRAVENOUS | Status: DC

## 2022-01-17 MED ORDER — FOLIC ACID 1 MG PO TABS
1.0000 mg | ORAL_TABLET | Freq: Every day | ORAL | Status: DC
  Administered 2022-01-17 – 2022-01-20 (×4): 1 mg via ORAL
  Filled 2022-01-17 (×4): qty 1

## 2022-01-17 MED ORDER — HYDROMORPHONE HCL 2 MG/ML IJ SOLN
2.0000 mg | Freq: Once | INTRAMUSCULAR | Status: AC
  Administered 2022-01-17: 2 mg via INTRAVENOUS
  Filled 2022-01-17: qty 1

## 2022-01-17 MED ORDER — HYDROMORPHONE 1 MG/ML IV SOLN
INTRAVENOUS | Status: DC
  Administered 2022-01-17 – 2022-01-18 (×2): 30 mg via INTRAVENOUS
  Administered 2022-01-18: 7.2 mg via INTRAVENOUS
  Administered 2022-01-18: 3 mg via INTRAVENOUS
  Administered 2022-01-18: 11.4 mg via INTRAVENOUS
  Administered 2022-01-18: 19.2 mg via INTRAVENOUS
  Administered 2022-01-18: 10.8 mg via INTRAVENOUS
  Administered 2022-01-18: 4.2 mg via INTRAVENOUS
  Administered 2022-01-19: 30 mg via INTRAVENOUS
  Administered 2022-01-19: 12.6 mg via INTRAVENOUS
  Administered 2022-01-19: 6.6 mg via INTRAVENOUS
  Administered 2022-01-19: 16.2 mg via INTRAVENOUS
  Administered 2022-01-19: 9.6 mg via INTRAVENOUS
  Administered 2022-01-19 (×2): 6.6 mg via INTRAVENOUS
  Administered 2022-01-19: 30 mg via INTRAVENOUS
  Administered 2022-01-20: 15.6 mg via INTRAVENOUS
  Filled 2022-01-17 (×5): qty 30

## 2022-01-17 MED ORDER — KETOROLAC TROMETHAMINE 15 MG/ML IJ SOLN
15.0000 mg | Freq: Four times a day (QID) | INTRAMUSCULAR | Status: DC
  Administered 2022-01-17 – 2022-01-20 (×12): 15 mg via INTRAVENOUS
  Filled 2022-01-17 (×12): qty 1

## 2022-01-17 MED ORDER — DIPHENHYDRAMINE HCL 25 MG PO CAPS: 25.0000 mg | ORAL_CAPSULE | ORAL | Status: DC | PRN

## 2022-01-17 NOTE — ED Triage Notes (Signed)
Pt reports sickle cell pain in bilateral legs and arms. Pt was at sickle cell clinic yesterday.

## 2022-01-17 NOTE — H&P (Signed)
H&P  Patient Demographics:  Stephanie Burch, is a 41 y.o. female  MRN: [id number]   DOB - [birth year]  Admit Date - 01/17/2022  Outpatient Primary MD for the patient is Patient, No Pcp Per  Chief Complaint  Patient presents with   Sickle Cell Pain Crisis      HPI:   Stephanie Burch  is a 41 y.o. female with a medical history significant for sickle cell disease type , chronic pain syndrome, opiate dependence and tolerance, anemia of chronic disease, and morbid obesity presents to the emergency department with complaints of pain to bilateral hips and lower extremities.  Pain intensity has been elevated over the past several weeks and mostly unrelieved by her home medications.  Patient has been evaluated in the sickle cell day clinic on 12/13 and 12/14 for management of her sickle cell crisis.  Patient says that pain intensity has been increasing shortly after discharge.  Patient attributes current pain crisis to changes in weather.  She has been taking all of her maintenance medication including Oxbryta and folic acid consistently.  Patient's home medications consist of oxycodone 30 mg every 4 hours and Dilaudid 4 mg every 6 hours as needed.  Patient's pain medications are managed by her hematology team at [facility].  Patient denies any fever, chills, chest pain, or shortness of breath.  No urinary symptoms, nausea, vomiting, or diarrhea.  No sick contacts, recent travel, or known surgical COVID-19  ER course: Vital signs show:BP (!) 131/90   Pulse 84   Temp 98.2 F (36.8 C) (Oral)   Resp 18   LMP 12/31/2021 (Exact Date)   SpO2 97%  CBC is unremarkable, which is essentially unchanged from previous.  Also basic metabolic panel couple.  Patient's pain persists despite IV Dilaudid, IV fluids, and IV Toradol.  Patient will be admitted for further management of sickle cell pain crisis.   Review of systems:  In addition to the HPI above, patient reports No fever or chills No Headache, No  changes with vision or hearing No problems swallowing food or liquids No chest pain, cough or shortness of breath No abdominal pain, No nausea or vomiting, Bowel movements are regular No blood in stool or urine No dysuria No new skin rashes or bruises No new joints pains-aches No new weakness, tingling, numbness in any extremity No recent weight gain or loss No polyuria, polydypsia or polyphagia No significant Mental Stressors   With Past History of the following :   Past Medical History:  Diagnosis Date   Acute kidney injury ([facility]) 11/07/2019   Anemia    Chronic pain    COVID-19 virus detected 10/22/2019   Depression    HCAP (healthcare-associated pneumonia) 11/07/2019   History of blood transfusion 2013   x 2   HSV infection    Hypoxia    Pneumonia    x 1   Pulmonary edema 11/11/2019   Sepsis ([facility]) 11/07/2019   Sickle cell anemia ([facility]) 09/2021   gets them frequently   Tachycardia with heart rate 100-120 beats per minute    Vitamin B12 deficiency 12/2018   Vitamin D deficiency       Past Surgical History:  Procedure Laterality Date   CESAREAN SECTION  2013   x 1   TOOTH EXTRACTION N/A 10/18/2021   Procedure: DENTAL RESTORATION/EXTRACTIONS;  Surgeon: Ocie Doyne, DMD;  Location: [facility] OR;  Service: Oral Surgery;  Laterality: N/A;     Social History:   Social History  Tobacco Use   Smoking status: Never   Smokeless tobacco: Never  Substance Use Topics   Alcohol use: Not Currently     Lives - At home   Family History :   Family History  Problem Relation Age of Onset   Hypertension Mother    Sickle cell trait Mother    Diabetes Father    Sickle cell trait Father      Home Medications:   Prior to Admission medications   Medication Sig Start Date End Date Taking? Authorizing Provider  acetaminophen (TYLENOL) 500 MG tablet Take 1,000 mg by mouth every 6 (six) hours as needed for mild pain, moderate pain or headache.    [provider]  folic acid  (FOLVITE) 1 MG tablet Take 1 mg by mouth daily.    [provider]  HYDROmorphone (DILAUDID) 4 MG tablet Take 4 mg by mouth every 6 (six) hours.    [provider]  ibuprofen (ADVIL) 200 MG tablet Take 800 mg by mouth 3 (three) times daily as needed for fever, headache or mild pain.    [provider]  ondansetron (ZOFRAN) 4 MG tablet Take 4 mg by mouth every 8 (eight) hours as needed for nausea or vomiting.    [provider]  OXBRYTA 500 MG TABS tablet Take 1,500 mg by mouth daily in the afternoon. 10/04/21   [provider]  oxycodone (ROXICODONE) 30 MG immediate release tablet Take 30 mg by mouth every 4 (four) hours.    [provider]     Allergies:   No Known Allergies   Physical Exam:   Vitals:   Vitals:   01/17/22 1330 01/17/22 1400  BP: (!) 142/87 (!) 131/90  Pulse: 71 84  Resp: 18 18  Temp:    SpO2: 95% 97%    Physical Exam: Constitutional: Patient appears well-developed and well-nourished. Not in obvious distress.  Morbid obesity HENT: Normocephalic, atraumatic, External right and left ear normal. Oropharynx is clear and moist.  Eyes: Conjunctivae and EOM are normal. PERRLA, no scleral icterus. Neck: Normal ROM. Neck supple. No JVD. No tracheal deviation. No thyromegaly. CVS: RRR, S1/S2 +, no murmurs, no gallops, no carotid bruit.  Pulmonary: Effort and breath sounds normal, no stridor, rhonchi, wheezes, rales.  Abdominal: Soft. BS +, no distension, tenderness, rebound or guarding.  Musculoskeletal: Normal range of motion. No edema and no tenderness.  Lymphadenopathy: No lymphadenopathy noted, cervical, inguinal or axillary Neuro: Alert. Normal reflexes, muscle tone coordination. No cranial nerve deficit. Skin: Skin is warm and dry. No rash noted. Not diaphoretic. No erythema. No pallor. Psychiatric: Normal mood and affect. Behavior, judgment, thought content normal.   Data Review:   CBC Recent Labs  Lab  01/15/22 0933 01/17/22 1009  WBC 7.7 9.4  HGB 12.6 12.7  HCT 37.2 37.4  PLT 279 270  MCV 71.5* 71.0*  MCH 24.2* 24.1*  MCHC 33.9 34.0  RDW 16.0* 15.6*  LYMPHSABS 1.8 1.8  MONOABS 0.6 0.6  EOSABS 0.2 0.2  BASOSABS 0.0 0.0   ------------------------------------------------------------------------------------------------------------------  Chemistries  Recent Labs  Lab 01/15/22 0933 01/17/22 1009  NA 141 140  K 4.5 3.8  CL 112* 111  CO2 22 22  GLUCOSE 114* 111*  BUN 7 8  CREATININE 0.66 0.63  CALCIUM 9.1 9.5  AST 19  --   ALT 13  --   ALKPHOS 64  --   BILITOT 0.8  --    ------------------------------------------------------------------------------------------------------------------ CrCl cannot be calculated (Unknown ideal weight.). ------------------------------------------------------------------------------------------------------------------  No results for input(s): "TSH", "T4TOTAL", "T3FREE", "THYROIDAB" in the last 72 hours.  Invalid input(s): "FREET3"  Coagulation profile No results for input(s): "INR", "PROTIME" in the last 168 hours. ------------------------------------------------------------------------------------------------------------------- No results for input(s): "DDIMER" in the last 72 hours. -------------------------------------------------------------------------------------------------------------------  Cardiac Enzymes No results for input(s): "CKMB", "TROPONINI", "MYOGLOBIN" in the last 168 hours.  Invalid input(s): "CK" ------------------------------------------------------------------------------------------------------------------    Component Value Date/Time   BNP 17.5 11/21/2021 1403    ---------------------------------------------------------------------------------------------------------------  Urinalysis    Component Value Date/Time   COLORURINE YELLOW 11/21/2021 1323   APPEARANCEUR CLEAR 11/21/2021 1323   LABSPEC 1.016  11/21/2021 1323   PHURINE 5.0 11/21/2021 1323   GLUCOSEU NEGATIVE 11/21/2021 1323   HGBUR NEGATIVE 11/21/2021 1323   BILIRUBINUR NEGATIVE 11/21/2021 1323   BILIRUBINUR Negative 04/27/2019 0935   KETONESUR NEGATIVE 11/21/2021 1323   PROTEINUR 100 (A) 11/21/2021 1323   UROBILINOGEN 1.0 04/27/2019 0935   NITRITE NEGATIVE 11/21/2021 1323   LEUKOCYTESUR NEGATIVE 11/21/2021 1323    ----------------------------------------------------------------------------------------------------------------   Imaging Results:    No results found.   Assessment & Plan:  Active Problems:   Sickle cell pain crisis (HCC)  Sickle cell disease with pain crisis: Patient will be admitted for management of her sickle cell pain crisis.  Her pain persists despite IV Dilaudid, IV Toradol, and IV fluids.  Will initiate IV Dilaudid PCA as inpatient room becomes available.  Settings of 0.6 mg, 10-minute lockout, and 4 mg/h Will hold home oxycodone and home oral Dilaudid, will reintroduce his pain intensity improves. Toradol 15 mg every 6 hours Patient does not warrant additional IV fluids, continue at [facility] Monitor vital signs very closely, reevaluate pain scale regularly, and supplemental oxygen as needed  Syndrome: Will restart home medications as pain intensity  Anemia of chronic disease: Patient's hemoglobin is stable and above baseline.  Patient's hemoglobin has been increased since starting Oxbryta initiated by her hematologist several months ago.  Will continue this medication.  Also, continue folic acid.  Morbid obesity: Patient counseled at length on the importance of following a low carbohydrate diet divided over small meals throughout the day.  Patient has been unable to exercise due to chronic pain. The patient is asked to make an attempt to improve diet patterns to aid in medical management of this problem.   DVT Prophylaxis: Subcut Lovenox   AM Labs Ordered, also please review Full Orders  Family  Communication: Admission, patient's condition and plan of care including tests being ordered have been discussed with the patient who indicate understanding and agree with the plan and Code Status.  Code Status: Full Code  Consults called: None    Admission status: Inpatient    Time spent in minutes : 30 minutes  Nolon Nations  APRN, MSN, FNP-C Patient Care [facility] [address] [place], [place] [address] [phone]  01/17/2022 at 2:30 PM

## 2022-01-17 NOTE — ED Provider Notes (Signed)
[facility]-EMERGENCY DEPT Provider Note   CSN: [id number] Arrival date & time: 01/17/22  0906     History  Chief Complaint  Patient presents with   Sickle Cell Pain Crisis    Stephanie Burch is a 41 y.o. female.  Patient is a 41 year old female with a past medical history of sickle cell anemia, chronic pain syndrome, and obesity presenting to the emergency department with concern for pain crisis.  Patient states over the last 3 days she has had pain in her bilateral legs and in her left arm.  She states that she was seen and treated in the clinic the last 2 days and initially her pain improved at time of discharge but throughout the night her pain worsened at home.  She states that she is on oxycodone at home which she has been taking as prescribed without significant relief.  She denies any trauma or falls, numbness or weakness.  She states that her pain feels similar to prior pain crises in the past.  She denies any fever, chest pain or shortness of breath.  The history is provided by the patient.  Sickle Cell Pain Crisis      Home Medications Prior to Admission medications   Medication Sig Start Date End Date Taking? Authorizing Provider  acetaminophen (TYLENOL) 500 MG tablet Take 1,000 mg by mouth every 6 (six) hours as needed for mild pain, moderate pain or headache.    [provider]  folic acid (FOLVITE) 1 MG tablet Take 1 mg by mouth daily.    [provider]  HYDROmorphone (DILAUDID) 4 MG tablet Take 4 mg by mouth every 6 (six) hours.    [provider]  ibuprofen (ADVIL) 200 MG tablet Take 800 mg by mouth 3 (three) times daily as needed for fever, headache or mild pain.    [provider]  ondansetron (ZOFRAN) 4 MG tablet Take 4 mg by mouth every 8 (eight) hours as needed for nausea or vomiting.    [provider]  OXBRYTA 500 MG TABS tablet Take 1,500 mg by mouth daily in the afternoon. 10/04/21   [provider]  oxycodone (ROXICODONE) 30 MG immediate release tablet Take 30 mg by mouth every 4 (four) hours.    [provider]      Allergies    Patient has no known allergies.    Review of Systems   Review of Systems  Physical Exam Updated Vital Signs BP (!) 163/113   Pulse 72   Temp 98.1 F (36.7 C) (Oral)   Resp 16   LMP 12/31/2021 (Exact Date)   SpO2 99%  Physical Exam Vitals and nursing note reviewed.  Constitutional:      Appearance: Normal appearance. She is obese.     Comments: Uncomfortable appearing, tearful on exam  HENT:     Head: Normocephalic and atraumatic.     Nose: Nose normal.     Mouth/Throat:     Mouth: Mucous membranes are moist.     Pharynx: Oropharynx is clear.  Eyes:     Extraocular Movements: Extraocular movements intact.     Conjunctiva/sclera: Conjunctivae normal.  Cardiovascular:     Rate and Rhythm: Normal rate and regular rhythm.     Pulses: Normal pulses.     Heart sounds: Normal heart sounds.  Pulmonary:     Effort: Pulmonary effort is normal.     Breath sounds: Normal breath sounds.  Abdominal:     General: Abdomen  is flat.     Palpations: Abdomen is soft.     Tenderness: There is no abdominal tenderness.  Musculoskeletal:        General: Tenderness (diffuse tenderness to palpation of bilateral thighs and left upper arm) present. No swelling or deformity. Normal range of motion.     Cervical back: Normal range of motion and neck supple.     Right lower leg: No edema.     Left lower leg: No edema.  Skin:    General: Skin is warm and dry.     Findings: No bruising or rash.  Neurological:     General: No focal deficit present.     Mental Status: She is alert and oriented to person, place, and time.     Sensory: No sensory deficit.     Motor: No weakness.  Psychiatric:        Mood and Affect: Mood normal.        Behavior: Behavior normal.     ED Results / Procedures / Treatments   Labs (all labs ordered are  listed, but only abnormal results are displayed) Labs Reviewed  RETICULOCYTES - Abnormal; Notable for the following components:      Result Value   Retic Ct Pct 3.2 (*)    RBC. 5.28 (*)    Immature Retic Fract 26.3 (*)    All other components within normal limits  BASIC METABOLIC PANEL - Abnormal; Notable for the following components:   Glucose, Bld 111 (*)    All other components within normal limits  CBC WITH DIFFERENTIAL/PLATELET - Abnormal; Notable for the following components:   RBC 5.27 (*)    MCV 71.0 (*)    MCH 24.1 (*)    RDW 15.6 (*)    nRBC 0.3 (*)    All other components within normal limits  I-STAT BETA HCG BLOOD, ED ([facility], [facility], [facility] ONLY)    EKG None  Radiology No results found.  Procedures Procedures    Medications Ordered in ED Medications  0.45 % sodium chloride infusion ( Intravenous New Bag/Given 01/17/22 1022)  ketorolac (TORADOL) 15 MG/ML injection 15 mg (15 mg Intravenous Given 01/17/22 1022)  HYDROmorphone (DILAUDID) injection 2 mg (2 mg Intravenous Given 01/17/22 1037)  HYDROmorphone (DILAUDID) injection 2 mg (2 mg Intravenous Given 01/17/22 1106)  HYDROmorphone (DILAUDID) injection 2 mg (2 mg Intravenous Given 01/17/22 1141)    ED Course/ Medical Decision Making/ A&P Clinical Course as of 01/17/22 1321  Fri Jan 17, 2022  1040 Labs at baseline, appropriate reticulocyte count without evidence of aplastic crisis. She will be reassessed for pain control. [VK]  1112 Upon reevaluation, patient just received second dose of Dilaudid.  She states that she has not had significant pain improvement yet at this time.  She will be closely reassessed to see if she may need a third dose of Dilaudid to determine her disposition. [VK]  1230 On reassessment, the patient is continuing to have significant pain despite multiple doses of IV Dilaudid.  Sickle cell team will be consulted for admission for pain control. [VK]  1321 [place] NP will evaluate the patient at  bedside and plans for admission to obs for pain control [VK]    Clinical Course User Index [VK] Rexford Maus, DO                           Medical Decision Making This patient presents to the ED with chief complaint(s)  of LE pain with pertinent past medical history of SS anemia, chronic pain syndrome, obesity which further complicates the presenting complaint. The complaint involves an extensive differential diagnosis and also carries with it a high risk of complications and morbidity.    The differential diagnosis includes pain crisis, chronic pain syndrome, no trauma or falls making fracture unlikely, she has palpable pulses and her legs are warm and well-perfused making ischemic limb unlikely  Additional history obtained: Additional history obtained from N/A Records reviewed outpatient sickle cell clinic records  ED Course and Reassessment: Patient has been evaluated in the outpatient sickle cell clinic the last few days and has had failed improvement with pain control as an outpatient.  She will have repeat labs and be given IV pain control here.  Due to patient's recurrent pain, likely will have low threshold for admission for continued pain control.  Independent labs interpretation:  The following labs were independently interpreted: at baseline, no acute abnormalities   Independent visualization of imaging: N/A  Consultation: - Consulted or discussed management/test interpretation w/ external professional: Sickle cell  Consideration for admission or further workup: patient requires admission for further pain control Social Determinants of health: N/A    Amount and/or Complexity of Data Reviewed Labs: ordered.  Risk Prescription drug management. Decision regarding hospitalization.          Final Clinical Impression(s) / ED Diagnoses Final diagnoses:  Sickle cell pain crisis [facility])    Rx / DC Orders ED Discharge Orders     None          Rexford Maus, DO 01/17/22 1321

## 2022-01-17 NOTE — Telephone Encounter (Signed)
Patient called requesting to come to the day hospital for sickle cell pain. Patient has received treatment in the day hospital for the last two day. Patient reports that she feels that she will require admission to [facility] hospital for overnight treatment. [place], FNP notified and advised that patient go to the ED for treatment of pain crisis and possible admission. Patient advised and expresses an understanding.

## 2022-01-18 DIAGNOSIS — D5700 Hb-SS disease with crisis, unspecified: Secondary | ICD-10-CM | POA: Diagnosis not present

## 2022-01-18 MED ORDER — ORAL CARE MOUTH RINSE: 15.0000 mL | OROMUCOSAL | Status: DC | PRN

## 2022-01-18 MED ORDER — OXYCODONE HCL 5 MG PO TABS
30.0000 mg | ORAL_TABLET | Freq: Four times a day (QID) | ORAL | Status: DC
  Administered 2022-01-18 – 2022-01-20 (×9): 30 mg via ORAL
  Filled 2022-01-18 (×9): qty 6

## 2022-01-18 MED ORDER — PROCHLORPERAZINE EDISYLATE 10 MG/2ML IJ SOLN
10.0000 mg | Freq: Once | INTRAMUSCULAR | Status: DC
  Filled 2022-01-18: qty 2

## 2022-01-18 NOTE — Progress Notes (Signed)
SICKLE CELL SERVICE PROGRESS NOTE  Stephanie Burch GMW:[id number] DOB: Dec 10, 1980 DOA: 01/17/2022 PCP: Patient, No Pcp Per  Assessment/Plan: Active Problems:   Sickle cell pain crisis (HCC)  Sickle cell pain crisis: Patient having recurrent pain crisis.  Still on Dilaudid PCA controlled on IV fluids.  She has been in and out of the hospital lately.  Was in the day hospital also couple of days ago.  Will continue to titrate of Dilaudid PCA. Chronic pain syndrome: Restarted her oxycodone today.  Continue to titrate PCA Anemia of chronic disease: H&H stable. Morbid obesity: Counseling provided  Code Status: Full code Family Communication: No family at bedside Disposition Plan: Home when ready  [facility]  Pager [phone]. If 7PM-7AM, please contact night-coverage.  01/18/2022, 10:54 AM  LOS: 1 day   Brief narrative: Stephanie Burch  is a 41 y.o. female with a medical history significant for sickle cell disease type [place], chronic pain syndrome, opiate dependence and tolerance, anemia of chronic disease, and morbid obesity presents to the emergency department with complaints of pain to bilateral hips and lower extremities.  Pain intensity has been elevated over the past several weeks and mostly unrelieved by her home medications.  Patient has been evaluated in the sickle cell day clinic on 12/13 and 12/14 for management of her sickle cell crisis.  Patient says that pain intensity has been increasing shortly after discharge.  Patient attributes current pain crisis to changes in weather.  She has been taking all of her maintenance medication including Oxbryta and folic acid consistently.  Patient's home medications consist of oxycodone 30 mg every 4 hours and Dilaudid 4 mg every 6 hours as needed.  Patient's pain medications are managed by her hematology team at [facility].  Patient denies any fever, chills, chest pain, or shortness of breath.  No urinary symptoms, nausea, vomiting, or  diarrhea.  No sick contacts, recent travel, or known surgical COVID-19    Consultants: None  Procedures: None  Antibiotics: None  HPI/Subjective: Patient continues to have pain that sounds 12.  She looks comfortable sitting in bed but still complains of pain especially in her hips bilaterally.  Objective: Vitals:   01/18/22 0320 01/18/22 0434 01/18/22 0755 01/18/22 0815  BP:  131/63  (!) 144/78  Pulse:  82  85  Resp: 15 18 18 11   Temp:  97.6 F (36.4 C)  98.7 F (37.1 C)  TempSrc:  Oral  Oral  SpO2: 93% 91% 93% 94%   Weight change:   Intake/Output Summary (Last 24 hours) at 01/18/2022 1054 Last data filed at 01/18/2022 0900 Gross per 24 hour  Intake 465.08 ml  Output --  Net 465.08 ml    General: Alert, awake, oriented x3, in no acute distress.  HEENT: [place]/AT [place]ERL, EOMI Neck: Trachea midline,  no masses, no thyromegal,y no JVD, no carotid bruit OROPHARYNX:  Moist, No exudate/ erythema/lesions.  Heart: Regular rate and rhythm, without murmurs, rubs, gallops, PMI non-displaced, no heaves or thrills on palpation.  Lungs: Clear to auscultation, no wheezing or rhonchi noted. No increased vocal fremitus resonant to percussion  Abdomen: Soft, nontender, nondistended, positive bowel sounds, no masses no hepatosplenomegaly noted..  Neuro: No focal neurological deficits noted cranial nerves II through XII grossly intact. DTRs 2+ bilaterally upper and lower extremities. Strength 5 out of 5 in bilateral upper and lower extremities. Musculoskeletal: No warm swelling or erythema around joints, no spinal tenderness noted. Psychiatric: Patient alert and oriented x3, good insight and cognition, good  recent to remote recall. Lymph node survey: No cervical axillary or inguinal lymphadenopathy noted.   Data Reviewed: Basic Metabolic Panel: Recent Labs  Lab 01/15/22 0933 01/17/22 1009  NA 141 140  K 4.5 3.8  CL 112* 111  CO2 22 22  GLUCOSE 114* 111*  BUN 7 8  CREATININE 0.66  0.63  CALCIUM 9.1 9.5   Liver Function Tests: Recent Labs  Lab 01/15/22 0933  AST 19  ALT 13  ALKPHOS 64  BILITOT 0.8  PROT 7.3  ALBUMIN 3.7   No results for input(s): "LIPASE", "AMYLASE" in the last 168 hours. No results for input(s): "AMMONIA" in the last 168 hours. CBC: Recent Labs  Lab 01/15/22 0933 01/17/22 1009  WBC 7.7 9.4  NEUTROABS 5.1 6.8  HGB 12.6 12.7  HCT 37.2 37.4  MCV 71.5* 71.0*  PLT 279 270   Cardiac Enzymes: No results for input(s): "CKTOTAL", "CKMB", "CKMBINDEX", "TROPONINI" in the last 168 hours. BNP (last 3 results) Recent Labs    11/21/21 1403  BNP 17.5    ProBNP (last 3 results) No results for input(s): "PROBNP" in the last 8760 hours.  CBG: No results for input(s): "GLUCAP" in the last 168 hours.  No results found for this or any previous visit (from the past 240 hour(s)).   Studies: No results found.  Scheduled Meds:  enoxaparin (LOVENOX) injection  40 mg Subcutaneous Q24H   folic acid  1 mg Oral Daily   HYDROmorphone   Intravenous Q4H   ketorolac  15 mg Intravenous Q6H   oxycodone  30 mg Oral Q6H   senna-docusate  1 tablet Oral BID   voxelotor  1,500 mg Oral Q1500   Continuous Infusions:  sodium chloride 10 mL/hr at 01/18/22 0347    Active Problems:   Sickle cell pain crisis (HCC)

## 2022-01-19 DIAGNOSIS — D5700 Hb-SS disease with crisis, unspecified: Secondary | ICD-10-CM | POA: Diagnosis not present

## 2022-01-19 NOTE — Progress Notes (Signed)
SICKLE CELL SERVICE PROGRESS NOTE  Stephanie Burch UQJ:[id number] DOB: Jul 26, 1980 DOA: 01/17/2022 PCP: Patient, No Pcp Per  Assessment/Plan: Active Problems:   Sickle cell pain crisis (HCC)  Sickle cell pain crisis: Patient having recurrent pain crisis.  Still on Dilaudid PCA controlled on IV fluids.  She has been in and out of the hospital lately.  Was in the day hospital also couple of days ago.  Will continue to titrate of Dilaudid PCA. Chronic pain syndrome: Restarted her oxycodone today.  Continue to titrate PCA Anemia of chronic disease: H&H stable. Morbid obesity: Counseling provided  Code Status: Full code Family Communication: No family at bedside Disposition Plan: Home when ready  [facility]  Pager [phone]. If 7PM-7AM, please contact night-coverage.  01/19/2022, 12:49 PM  LOS: 2 days   Brief narrative: Stephanie Burch  is a 41 y.o. female with a medical history significant for sickle cell disease type [place], chronic pain syndrome, opiate dependence and tolerance, anemia of chronic disease, and morbid obesity presents to the emergency department with complaints of pain to bilateral hips and lower extremities.  Pain intensity has been elevated over the past several weeks and mostly unrelieved by her home medications.  Patient has been evaluated in the sickle cell day clinic on 12/13 and 12/14 for management of her sickle cell crisis.  Patient says that pain intensity has been increasing shortly after discharge.  Patient attributes current pain crisis to changes in weather.  She has been taking all of her maintenance medication including Oxbryta and folic acid consistently.  Patient's home medications consist of oxycodone 30 mg every 4 hours and Dilaudid 4 mg every 6 hours as needed.  Patient's pain medications are managed by her hematology team at [facility].  Patient denies any fever, chills, chest pain, or shortness of breath.  No urinary symptoms, nausea, vomiting, or  diarrhea.  No sick contacts, recent travel, or known surgical COVID-19    Consultants: None  Procedures: None  Antibiotics: None  HPI/Subjective: Patient continues to have improved pain. Has pain at 6/10.   Objective: Vitals:   01/19/22 0513 01/19/22 0549 01/19/22 0826 01/19/22 1015  BP: (!) 125/91   127/78  Pulse: (!) 105   (!) 106  Resp: 16 16 16 16   Temp: 98.1 F (36.7 C)   98.3 F (36.8 C)  TempSrc: Oral   Oral  SpO2: 90% 90% 94% 90%   Weight change:   Intake/Output Summary (Last 24 hours) at 01/19/2022 1249 Last data filed at 01/19/2022 1127 Gross per 24 hour  Intake 240 ml  Output --  Net 240 ml     General: Alert, awake, oriented x3, in no acute distress.  HEENT: [place]/AT PEERL, EOMI Neck: Trachea midline,  no masses, no thyromegal,y no JVD, no carotid bruit OROPHARYNX:  Moist, No exudate/ erythema/lesions.  Heart: Regular rate and rhythm, without murmurs, rubs, gallops, PMI non-displaced, no heaves or thrills on palpation.  Lungs: Clear to auscultation, no wheezing or rhonchi noted. No increased vocal fremitus resonant to percussion  Abdomen: Soft, nontender, nondistended, positive bowel sounds, no masses no hepatosplenomegaly noted..  Neuro: No focal neurological deficits noted cranial nerves II through XII grossly intact. DTRs 2+ bilaterally upper and lower extremities. Strength 5 out of 5 in bilateral upper and lower extremities. Musculoskeletal: No warm swelling or erythema around joints, no spinal tenderness noted. Psychiatric: Patient alert and oriented x3, good insight and cognition, good recent to remote recall. Lymph node survey: No cervical axillary or  inguinal lymphadenopathy noted.   Data Reviewed: Basic Metabolic Panel: Recent Labs  Lab 01/15/22 0933 01/17/22 1009  NA 141 140  K 4.5 3.8  CL 112* 111  CO2 22 22  GLUCOSE 114* 111*  BUN 7 8  CREATININE 0.66 0.63  CALCIUM 9.1 9.5    Liver Function Tests: Recent Labs  Lab  01/15/22 0933  AST 19  ALT 13  ALKPHOS 64  BILITOT 0.8  PROT 7.3  ALBUMIN 3.7    No results for input(s): "LIPASE", "AMYLASE" in the last 168 hours. No results for input(s): "AMMONIA" in the last 168 hours. CBC: Recent Labs  Lab 01/15/22 0933 01/17/22 1009  WBC 7.7 9.4  NEUTROABS 5.1 6.8  HGB 12.6 12.7  HCT 37.2 37.4  MCV 71.5* 71.0*  PLT 279 270    Cardiac Enzymes: No results for input(s): "CKTOTAL", "CKMB", "CKMBINDEX", "TROPONINI" in the last 168 hours. BNP (last 3 results) Recent Labs    11/21/21 1403  BNP 17.5     ProBNP (last 3 results) No results for input(s): "PROBNP" in the last 8760 hours.  CBG: No results for input(s): "GLUCAP" in the last 168 hours.  No results found for this or any previous visit (from the past 240 hour(s)).   Studies: No results found.  Scheduled Meds:  enoxaparin (LOVENOX) injection  40 mg Subcutaneous Q24H   folic acid  1 mg Oral Daily   HYDROmorphone   Intravenous Q4H   ketorolac  15 mg Intravenous Q6H   oxycodone  30 mg Oral Q6H   prochlorperazine  10 mg Intravenous Once   senna-docusate  1 tablet Oral BID   voxelotor  1,500 mg Oral Q1500   Continuous Infusions:  sodium chloride 10 mL/hr at 01/18/22 0347    Active Problems:   Sickle cell pain crisis (HCC)

## 2022-01-19 NOTE — Progress Notes (Signed)
Placed resource information on AVS which includes agencies who may be able to assist with utilities.

## 2022-01-20 DIAGNOSIS — D5700 Hb-SS disease with crisis, unspecified: Secondary | ICD-10-CM | POA: Diagnosis not present

## 2022-01-20 NOTE — Discharge Summary (Signed)
Physician Discharge Summary  Stephanie Burch AJO:[id number] DOB: Apr 28, 1980 DOA: 01/17/2022  PCP: Patient, No Pcp Per  Admit date: 01/17/2022  Discharge date: 01/20/2022  Discharge Diagnoses:  Active Problems:   Sickle cell pain crisis [facility])   Discharge Condition: Stable  Disposition:   Follow-up Information     Holiday representative. Go to.   Why: Serves single women and single women with children. Individuals seeking shelter should call the [facility] at [phone] before 3:00PM to ensure that staff is available to operate the shelter. Hours: [employer] operates from [employer]. Solicitor information: [address], [place], [place]        [place], [place] R, FNP Follow up in 2 day(s).   Specialty: Nurse Practitioner Contact information: [address] [place] [address] [phone]                Pt is discharged home in good condition and is to follow up with Patient, No Pcp Per this week to have labs evaluated. Stephanie Burch is instructed to increase activity slowly and balance with rest for the next few days, and use prescribed medication to complete treatment of pain  Diet: Regular Wt Readings from Last 3 Encounters:  01/19/22 (!) 149.7 kg  01/03/22 (!) 145.2 kg  12/23/21 (!) 145.2 kg    History of present illness:  Stephanie Burch  is a 41 y.o. female with a medical history significant for sickle cell disease type [place], chronic pain syndrome, opiate dependence and tolerance, anemia of chronic disease, and morbid obesity presents to the emergency department with complaints of pain to bilateral hips and lower extremities.  Pain intensity has been elevated over the past several weeks and mostly unrelieved by her home medications.  Patient has been evaluated in the sickle cell day clinic on 12/13 and 12/14 for management of her sickle cell crisis.  Patient says that pain intensity has been increasing shortly after discharge.   Patient attributes current pain crisis to changes in weather.  She has been taking all of her maintenance medication including Oxbryta and folic acid consistently.  Patient's home medications consist of oxycodone 30 mg every 4 hours and Dilaudid 4 mg every 6 hours as needed.  Patient's pain medications are managed by her hematology team at [facility].  Patient denies any fever, chills, chest pain, or shortness of breath.  No urinary symptoms, nausea, vomiting, or diarrhea.  No sick contacts, recent travel, or known surgical COVID-19   Hospital Course:  Sickle cell disease with pain crisis: Patient was admitted for sickle cell pain crisis and managed appropriately with IVF, IV Dilaudid via PCA and IV Toradol, as well as other adjunct therapies per sickle cell pain management protocols.  Patient will resume her home medications.  She has an appointment scheduled with [facility] hematology this afternoon.  Her hematology team manages her home pain medications. Patient has appointment scheduled to establish care with a new PCP in 2 weeks.  Reviewed all laboratory values, consistent with patient's baseline.  Patient was therefore discharged home today in a hemodynamically stable condition.   Stephanie Burch will follow-up with PCP within 1 week of this discharge. Stephanie Burch was counseled extensively about nonpharmacologic means of pain management, patient verbalized understanding and was appreciative of  the care received during this admission.   We discussed the need for good hydration, monitoring of hydration status, avoidance of heat, cold, stress, and infection triggers. We discussed the need to be  adherent with taking Hydrea and other home medications. Patient was reminded of the need to seek medical attention immediately if any symptom of bleeding, anemia, or infection occurs.  Discharge Exam: Vitals:   01/20/22 0551 01/20/22 0827  BP:  (!) 142/83  Pulse:  97  Resp: 14 16  Temp:  98.2 F  (36.8 C)  SpO2: 92% 95%   Vitals:   01/20/22 0001 01/20/22 0450 01/20/22 0551 01/20/22 0827  BP: 111/83 137/77  (!) 142/83  Pulse: (!) 110 (!) 104  97  Resp: 16 16 14 16   Temp: 98.2 F (36.8 C) 98 F (36.7 C)  98.2 F (36.8 C)  TempSrc: Oral Oral  Oral  SpO2: 92% 91% 92% 95%  Weight:      Height:        General appearance : Awake, alert, not in any distress. Speech Clear. Not toxic looking HEENT: Atraumatic and Normocephalic, pupils equally reactive to light and accomodation Neck: Supple, no JVD. No cervical lymphadenopathy.  Chest: Good air entry bilaterally, no added sounds  CVS: S1 S2 regular, no murmurs.  Abdomen: Bowel sounds present, Non tender and not distended with no gaurding, rigidity or rebound. Extremities: B/L Lower Ext shows no edema, both legs are warm to touch Neurology: Awake alert, and oriented X 3, CN II-XII intact, Non focal Skin: No Rash  Discharge Instructions  Discharge Instructions     Discharge patient   Complete by: As directed    Discharge disposition: 01-Home or Self Care   Discharge patient date: 01/20/2022      Allergies as of 01/20/2022   No Known Allergies      Medication List     TAKE these medications    acetaminophen 500 MG tablet Commonly known as: TYLENOL Take 1,000 mg by mouth every 6 (six) hours as needed for mild pain, moderate pain or headache.   folic acid 1 MG tablet Commonly known as: FOLVITE Take 1 mg by mouth daily.   HYDROmorphone 4 MG tablet Commonly known as: DILAUDID Take 4 mg by mouth every 6 (six) hours.   ibuprofen 200 MG tablet Commonly known as: ADVIL Take 800 mg by mouth 3 (three) times daily as needed for fever, headache or mild pain.   ondansetron 4 MG tablet Commonly known as: ZOFRAN Take 4 mg by mouth every 8 (eight) hours as needed for nausea or vomiting.   Oxbryta 500 MG Tabs tablet Generic drug: voxelotor Take 1,500 mg by mouth daily in the afternoon.   oxycodone 30 MG immediate  release tablet Commonly known as: ROXICODONE Take 30 mg by mouth every 4 (four) hours as needed for pain.        The results of significant diagnostics from this hospitalization (including imaging, microbiology, ancillary and laboratory) are listed below for reference.    Significant Diagnostic Studies: No results found.  Microbiology: No results found for this or any previous visit (from the past 240 hour(s)).   Labs: Basic Metabolic Panel: Recent Labs  Lab 01/15/22 0933 01/17/22 1009  NA 141 140  K 4.5 3.8  CL 112* 111  CO2 22 22  GLUCOSE 114* 111*  BUN 7 8  CREATININE 0.66 0.63  CALCIUM 9.1 9.5   Liver Function Tests: Recent Labs  Lab 01/15/22 0933  AST 19  ALT 13  ALKPHOS 64  BILITOT 0.8  PROT 7.3  ALBUMIN 3.7   No results for input(s): "LIPASE", "AMYLASE" in the last 168 hours. No results for input(s): "AMMONIA" in the  last 168 hours. CBC: Recent Labs  Lab 01/15/22 0933 01/17/22 1009  WBC 7.7 9.4  NEUTROABS 5.1 6.8  HGB 12.6 12.7  HCT 37.2 37.4  MCV 71.5* 71.0*  PLT 279 270   Cardiac Enzymes: No results for input(s): "CKTOTAL", "CKMB", "CKMBINDEX", "TROPONINI" in the last 168 hours. BNP: Invalid input(s): "POCBNP" CBG: No results for input(s): "GLUCAP" in the last 168 hours.  Time coordinating discharge: 30 minutes  Signed:  Nolon Nations  APRN, MSN, FNP-C Patient [facility] [address] [place], [place] [address] [phone]  [facility] 01/20/2022, 10:15 AM

## 2022-01-20 NOTE — Care Management Important Message (Signed)
Important Message  Patient Details  Name: Stephanie Burch MRN: [id number] Date of Birth: Mar 05, 1980   Medicare Important Message Given:  Yes     Mardene Sayer 01/20/2022, 4:13 PM

## 2022-01-30 ENCOUNTER — Other Ambulatory Visit: Payer: Self-pay | Admitting: Internal Medicine

## 2022-01-30 ENCOUNTER — Telehealth (HOSPITAL_COMMUNITY): Payer: Self-pay

## 2022-01-30 ENCOUNTER — Non-Acute Institutional Stay (HOSPITAL_BASED_OUTPATIENT_CLINIC_OR_DEPARTMENT_OTHER)
Admission: AD | Admit: 2022-01-30 | Discharge: 2022-01-30 | Disposition: A | Discharge status: Home / Self Care | Payer: Medicare Other | Source: Ambulatory Visit | Attending: Internal Medicine | Admitting: Internal Medicine

## 2022-01-30 DIAGNOSIS — D638 Anemia in other chronic diseases classified elsewhere: Secondary | ICD-10-CM | POA: Insufficient documentation

## 2022-01-30 DIAGNOSIS — F1120 Opioid dependence, uncomplicated: Secondary | ICD-10-CM | POA: Insufficient documentation

## 2022-01-30 DIAGNOSIS — G894 Chronic pain syndrome: Secondary | ICD-10-CM | POA: Insufficient documentation

## 2022-01-30 DIAGNOSIS — D5700 Hb-SS disease with crisis, unspecified: Secondary | ICD-10-CM | POA: Insufficient documentation

## 2022-01-30 LAB — CBC WITH DIFFERENTIAL/PLATELET
Abs Immature Granulocytes: 0.04 10*3/uL (ref 0.00–0.07)
Basophils Absolute: 0 10*3/uL (ref 0.0–0.1)
Basophils Relative: 0 %
Eosinophils Absolute: 0.1 10*3/uL (ref 0.0–0.5)
Eosinophils Relative: 1 %
HCT: 39.3 % (ref 36.0–46.0)
Hemoglobin: 13.3 g/dL (ref 12.0–15.0)
Immature Granulocytes: 0 %
Lymphocytes Relative: 21 %
Lymphs Abs: 2.1 10*3/uL (ref 0.7–4.0)
MCH: 23.7 pg — ABNORMAL LOW (ref 26.0–34.0)
MCHC: 33.8 g/dL (ref 30.0–36.0)
MCV: 69.9 fL — ABNORMAL LOW (ref 80.0–100.0)
Monocytes Absolute: 0.7 10*3/uL (ref 0.1–1.0)
Monocytes Relative: 7 %
Neutro Abs: 6.9 10*3/uL (ref 1.7–7.7)
Neutrophils Relative %: 71 %
Platelets: 295 10*3/uL (ref 150–400)
RBC: 5.62 MIL/uL — ABNORMAL HIGH (ref 3.87–5.11)
RDW: 15.2 % (ref 11.5–15.5)
WBC: 9.9 10*3/uL (ref 4.0–10.5)
nRBC: 0.2 % (ref 0.0–0.2)

## 2022-01-30 LAB — LACTATE DEHYDROGENASE: LDH: 203 U/L — ABNORMAL HIGH (ref 98–192)

## 2022-01-30 MED ORDER — ACETAMINOPHEN 500 MG PO TABS
1000.0000 mg | ORAL_TABLET | Freq: Once | ORAL | Status: AC
  Administered 2022-01-30: 1000 mg via ORAL
  Filled 2022-01-30: qty 2

## 2022-01-30 MED ORDER — ONDANSETRON HCL 4 MG/2ML IJ SOLN: 4.0000 mg | Freq: Four times a day (QID) | INTRAMUSCULAR | Status: DC | PRN

## 2022-01-30 MED ORDER — DIPHENHYDRAMINE HCL 25 MG PO CAPS: 25.0000 mg | ORAL_CAPSULE | ORAL | Status: DC | PRN

## 2022-01-30 MED ORDER — SENNOSIDES-DOCUSATE SODIUM 8.6-50 MG PO TABS: 1.0000 | ORAL_TABLET | Freq: Two times a day (BID) | ORAL | Status: DC

## 2022-01-30 MED ORDER — KETOROLAC TROMETHAMINE 15 MG/ML IJ SOLN
15.0000 mg | Freq: Four times a day (QID) | INTRAMUSCULAR | Status: DC
  Administered 2022-01-30: 15 mg via INTRAVENOUS
  Filled 2022-01-30: qty 1

## 2022-01-30 MED ORDER — POLYETHYLENE GLYCOL 3350 17 G PO PACK: 17.0000 g | PACK | Freq: Every day | ORAL | Status: DC | PRN

## 2022-01-30 MED ORDER — SODIUM CHLORIDE 0.45 % IV SOLN: INTRAVENOUS | Status: DC

## 2022-01-30 MED ORDER — HYDROMORPHONE 1 MG/ML IV SOLN
INTRAVENOUS | Status: DC
  Administered 2022-01-30: 30 mg via INTRAVENOUS
  Administered 2022-01-30: 16.5 mg via INTRAVENOUS
  Filled 2022-01-30: qty 30

## 2022-01-30 MED ORDER — SODIUM CHLORIDE 0.9% FLUSH: 9.0000 mL | INTRAVENOUS | Status: DC | PRN

## 2022-01-30 MED ORDER — NALOXONE HCL 0.4 MG/ML IJ SOLN: 0.4000 mg | INTRAMUSCULAR | Status: DC | PRN

## 2022-01-30 NOTE — Progress Notes (Signed)
Patient admitted to the day hospital for sickle cell pain. Initially, patient reported bilateral leg pain rated 8/10. For pain management, patient placed on Dilaudid PCA, given 15 mg IV Toradol, 1000 mg Tylenol and hydrated with IV fluids. At discharge, patient rated pain at 7/10. Vital signs stable. AVS offered but patient refused. Patient alert, oriented and ambulatory at discharge.

## 2022-01-30 NOTE — Telephone Encounter (Signed)
Pt called the day hospital looking to come in today for sickle cell pain. Pt reports 8/10 pain to bilateral legs. Pt reports taking Oxycodone 30 mg at 3 AM this morning. Pt denies being to the emergency room. Pt screened for COVID and denies exposure and symptoms. Pt denies fever, chest pain, N/V/D, and abdominal pain. Dr. Hyman Hopes notified and he said pt can come to day hospital today. Pt notified and verbalized understanding. Pt states that she will use taxi services as her transportation today.

## 2022-01-30 NOTE — H&P (Signed)
[facility] History and Physical  Stephanie Burch QBV:[id number] DOB: 1980/02/09 DOA: 01/30/2022  PCP: Patient, No Pcp Per   Chief Complaint: Sickle cell pain  HPI: YSABEL STANKOVICH is a 41 y.o. female with history of sickle cell disease, chronic pain syndrome, morbid obesity, opiate dependence and tolerance, and anemia of chronic disease who presented to the day hospital today with major complaints of bilateral hip pain and lower extremity pain.  Of note, patient was discharged from the hospital on 01/20/2022, less than 10 days ago.  During this encounter, she already predicted that she will also be in pain tomorrow she will come back to the day hospital.  Patient claims she took her home pain medications with no sustained relief.  Her pain is at 9/10 this morning, constant and achy.  She denies any fever, headache, cough, chest pain, shortness of breath, nausea, vomiting or diarrhea.  No urinary symptoms.  She denies any sick contacts, recent travels or known exposure to COVID-19.  Systemic Review: General: The patient denies anorexia, fever, weight loss Cardiac: Denies chest pain, syncope, palpitations, pedal edema  Respiratory: Denies cough, shortness of breath, wheezing GI: Denies severe indigestion/heartburn, abdominal pain, nausea, vomiting, diarrhea and constipation GU: Denies hematuria, incontinence, dysuria  Musculoskeletal: Denies arthritis  Skin: Denies suspicious skin lesions Neurologic: Denies focal weakness or numbness, change in vision  Past Medical History:  Diagnosis Date   Acute kidney injury (HCC) 11/07/2019   Anemia    Chronic pain    COVID-19 virus detected 10/22/2019   Depression    HCAP (healthcare-associated pneumonia) 11/07/2019   History of blood transfusion 2013   x 2   HSV infection    Hypoxia    Pneumonia    x 1   Pulmonary edema 11/11/2019   Sepsis (HCC) 11/07/2019   Sickle cell anemia (HCC) 09/2021   gets them frequently   Tachycardia  with heart rate 100-120 beats per minute    Vitamin B12 deficiency 12/2018   Vitamin D deficiency     Past Surgical History:  Procedure Laterality Date   CESAREAN SECTION  2013   x 1   TOOTH EXTRACTION N/A 10/18/2021   Procedure: DENTAL RESTORATION/EXTRACTIONS;  Surgeon: Ocie Doyne, DMD;  Location: [facility] OR;  Service: Oral Surgery;  Laterality: N/A;    No Known Allergies  Family History  Problem Relation Age of Onset   Hypertension Mother    Sickle cell trait Mother    Diabetes Father    Sickle cell trait Father       Prior to Admission medications   Medication Sig Start Date End Date Taking? Authorizing Provider  acetaminophen (TYLENOL) 500 MG tablet Take 1,000 mg by mouth every 6 (six) hours as needed for mild pain, moderate pain or headache.    [provider]  folic acid (FOLVITE) 1 MG tablet Take 1 mg by mouth daily.    [provider]  HYDROmorphone (DILAUDID) 4 MG tablet Take 4 mg by mouth every 6 (six) hours. Patient not taking: Reported on 01/17/2022    [provider]  ibuprofen (ADVIL) 200 MG tablet Take 800 mg by mouth 3 (three) times daily as needed for fever, headache or mild pain.    [provider]  ondansetron (ZOFRAN) 4 MG tablet Take 4 mg by mouth every 8 (eight) hours as needed for nausea or vomiting. Patient not taking: Reported on 01/17/2022    [provider]  OXBRYTA 500 MG TABS tablet Take 1,500  mg by mouth daily in the afternoon. 10/04/21   [provider]  oxycodone (ROXICODONE) 30 MG immediate release tablet Take 30 mg by mouth every 4 (four) hours as needed for pain.    [provider]     Physical Exam: There were no vitals filed for this visit.  General: Alert, awake, afebrile, anicteric, not in obvious distress HEENT: Normocephalic and Atraumatic, Mucous membranes pink                PERRLA; EOM intact; No scleral icterus,                 Nares: Patent, Oropharynx: Clear, Fair  Dentition                 Neck: FROM, no cervical lymphadenopathy, thyromegaly, carotid bruit or JVD;  CHEST WALL: No tenderness  CHEST: Normal respiration, clear to auscultation bilaterally  HEART: Regular rate and rhythm; no murmurs rubs or gallops  BACK: No kyphosis or scoliosis; no CVA tenderness  ABDOMEN: Positive Bowel Sounds, soft, non-tender; no masses, no organomegaly EXTREMITIES: No cyanosis, clubbing, or edema SKIN:  no rash or ulceration  CNS: Alert and Oriented x 4, Nonfocal exam, CN 2-12 intact  Labs on Admission:  Basic Metabolic Panel: No results for input(s): "NA", "K", "CL", "CO2", "GLUCOSE", "BUN", "CREATININE", "CALCIUM", "MG", "PHOS" in the last 168 hours. Liver Function Tests: No results for input(s): "AST", "ALT", "ALKPHOS", "BILITOT", "PROT", "ALBUMIN" in the last 168 hours. No results for input(s): "LIPASE", "AMYLASE" in the last 168 hours. No results for input(s): "AMMONIA" in the last 168 hours. CBC: No results for input(s): "WBC", "NEUTROABS", "HGB", "HCT", "MCV", "PLT" in the last 168 hours. Cardiac Enzymes: No results for input(s): "CKTOTAL", "CKMB", "CKMBINDEX", "TROPONINI" in the last 168 hours.  BNP (last 3 results) Recent Labs    11/21/21 1403  BNP 17.5    ProBNP (last 3 results) No results for input(s): "PROBNP" in the last 8760 hours.  CBG: No results for input(s): "GLUCAP" in the last 168 hours.   Assessment/Plan Principal Problem:   Sickle cell anemia with pain (HCC)  Admits to the Day Hospital for extended observation IVF 0.45% Saline @ 125 mls/hour Weight based Dilaudid PCA started within 30 minutes of admission IV Toradol 15 mg Q 6 H x 2 doses Acetaminophen 1000 mg x 1 dose Labs: CBCD, CMP, Retic Count and LDH Monitor vitals very closely, Re-evaluate pain scale every hour 2 L of Oxygen by  Patient will be re-evaluated for pain in the context of function and relationship to baseline as care progresses. If no significant  relieve from pain (remains above 5/10) will transfer patient to inpatient services for further evaluation and management  Code Status: Full  Family Communication: None  DVT Prophylaxis: Ambulate as tolerated   Time spent: 35 Minutes  Jeanann Lewandowsky, MD, MHA, FACP, FAAP, CPE  If 7PM-7AM, please contact night-coverage [email or web address] 01/30/2022, 9:28 AM

## 2022-01-30 NOTE — Discharge Summary (Signed)
Physician Discharge Summary  Stephanie Burch MBP:[id number] DOB: Sep 15, 1980 DOA: 01/30/2022  PCP: Patient, No Pcp Per  Admit date: 01/30/2022  Discharge date: 01/30/2022  Time spent: 30 minutes  Discharge Diagnoses:  Principal Problem:   Sickle cell anemia with pain [facility])   Discharge Condition: Stable  Diet recommendation: Regular  History of present illness:  Stephanie Burch is a 41 y.o. female with history of sickle cell disease, chronic pain syndrome, morbid obesity, opiate dependence and tolerance, and anemia of chronic disease who presented to the day hospital today with major complaints of bilateral hip pain and lower extremity pain.  Of note, patient was discharged from the hospital on 01/20/2022, less than 10 days ago.  During this encounter, she already predicted that she will also be in pain tomorrow she will come back to the day hospital.  Patient claims she took her home pain medications with no sustained relief.  Her pain is at 9/10 this morning, constant and achy.  She denies any fever, headache, cough, chest pain, shortness of breath, nausea, vomiting or diarrhea.  No urinary symptoms.  She denies any sick contacts, recent travels or known exposure to COVID-19.   Hospital Course:  Stephanie Burch was admitted to the day hospital with sickle cell painful crisis. Patient was treated with IV fluid, weight based IV Dilaudid PCA, IV Toradol, clinician assisted doses as deemed appropriate, and other adjunct therapies per sickle cell pain management protocol. Stephanie Burch showed significant improvement symptomatically, pain improved from 9/10 to 6/10 at the time of discharge. Patient was discharged home in a hemodynamically stable condition. Stephanie Burch will follow-up at the clinic as previously scheduled, continue with home medications as per prior to admission.  Discharge Instructions We discussed the need for good hydration, monitoring of hydration status, avoidance of heat, cold, stress,  and infection triggers. We discussed the need to be compliant with taking Hydrea and other home medications. Stephanie Burch was reminded of the need to seek medical attention immediately if any symptom of bleeding, anemia, or infection occurs.  Discharge Exam: Vitals:   01/30/22 1422 01/30/22 1611  BP: (!) 131/58 122/77  Pulse: (!) 104 (!) 103  Resp: 16 16  Temp:    SpO2: 94% 97%    General appearance: alert, cooperative and no distress Eyes: conjunctivae/corneas clear. PERRL, EOM's intact. Fundi benign. Neck: no adenopathy, no carotid bruit, no JVD, supple, symmetrical, trachea midline and thyroid not enlarged, symmetric, no tenderness/mass/nodules Back: symmetric, no curvature. ROM normal. No CVA tenderness. Resp: clear to auscultation bilaterally Chest wall: no tenderness Cardio: regular rate and rhythm, S1, S2 normal, no murmur, click, rub or gallop GI: soft, non-tender; bowel sounds normal; no masses,  no organomegaly Extremities: extremities normal, atraumatic, no cyanosis or edema Pulses: 2+ and symmetric Skin: Skin color, texture, turgor normal. No rashes or lesions Neurologic: Grossly normal  Discharge Instructions     Diet - low sodium heart healthy   Complete by: As directed    Increase activity slowly   Complete by: As directed       Allergies as of 01/30/2022   No Known Allergies      Medication List     TAKE these medications    acetaminophen 500 MG tablet Commonly known as: TYLENOL Take 1,000 mg by mouth every 6 (six) hours as needed for mild pain, moderate pain or headache.   folic acid 1 MG tablet Commonly known as: FOLVITE Take 1 mg by mouth daily.   HYDROmorphone 4 MG tablet  Commonly known as: DILAUDID Take 4 mg by mouth every 6 (six) hours.   ibuprofen 200 MG tablet Commonly known as: ADVIL Take 800 mg by mouth 3 (three) times daily as needed for fever, headache or mild pain.   ondansetron 4 MG tablet Commonly known as: ZOFRAN Take 4 mg by  mouth every 8 (eight) hours as needed for nausea or vomiting.   Oxbryta 500 MG Tabs tablet Generic drug: voxelotor Take 1,500 mg by mouth daily in the afternoon.   oxycodone 30 MG immediate release tablet Commonly known as: ROXICODONE Take 30 mg by mouth every 4 (four) hours as needed for pain.       No Known Allergies  Follow-up Information     Quentin Angst, MD Follow up.   Specialty: Internal Medicine Contact information: [address] [place] [address] [phone]                 Significant Diagnostic Studies: No results found.  Signed:  Jeanann Lewandowsky MD, MHA, FACP, Constance Goltz, CPE   01/30/2022, 4:13 PM

## 2022-01-31 ENCOUNTER — Emergency Department (HOSPITAL_COMMUNITY): Payer: Medicare Other

## 2022-01-31 ENCOUNTER — Encounter (HOSPITAL_COMMUNITY): Payer: Self-pay

## 2022-01-31 ENCOUNTER — Inpatient Hospital Stay (HOSPITAL_COMMUNITY)
Admission: EM | Admit: 2022-01-31 | Discharge: 2022-02-04 | DRG: 812 | Disposition: A | Discharge status: Home / Self Care | Payer: Medicare Other | Attending: Internal Medicine | Admitting: Internal Medicine

## 2022-01-31 DIAGNOSIS — F1120 Opioid dependence, uncomplicated: Secondary | ICD-10-CM | POA: Diagnosis present

## 2022-01-31 DIAGNOSIS — D638 Anemia in other chronic diseases classified elsewhere: Secondary | ICD-10-CM | POA: Diagnosis present

## 2022-01-31 DIAGNOSIS — D72829 Elevated white blood cell count, unspecified: Secondary | ICD-10-CM | POA: Diagnosis present

## 2022-01-31 DIAGNOSIS — Z8616 Personal history of COVID-19: Secondary | ICD-10-CM | POA: Diagnosis not present

## 2022-01-31 DIAGNOSIS — Z832 Family history of diseases of the blood and blood-forming organs and certain disorders involving the immune mechanism: Secondary | ICD-10-CM | POA: Diagnosis not present

## 2022-01-31 DIAGNOSIS — G894 Chronic pain syndrome: Secondary | ICD-10-CM | POA: Diagnosis present

## 2022-01-31 DIAGNOSIS — Z6841 Body Mass Index (BMI) 40.0 and over, adult: Secondary | ICD-10-CM

## 2022-01-31 DIAGNOSIS — Z833 Family history of diabetes mellitus: Secondary | ICD-10-CM | POA: Diagnosis not present

## 2022-01-31 DIAGNOSIS — Z8249 Family history of ischemic heart disease and other diseases of the circulatory system: Secondary | ICD-10-CM

## 2022-01-31 DIAGNOSIS — R52 Pain, unspecified: Secondary | ICD-10-CM

## 2022-01-31 DIAGNOSIS — Z79899 Other long term (current) drug therapy: Secondary | ICD-10-CM

## 2022-01-31 DIAGNOSIS — D5700 Hb-SS disease with crisis, unspecified: Principal | ICD-10-CM | POA: Diagnosis present

## 2022-01-31 DIAGNOSIS — Z79891 Long term (current) use of opiate analgesic: Secondary | ICD-10-CM

## 2022-01-31 LAB — BASIC METABOLIC PANEL
Anion gap: 9 (ref 5–15)
BUN: 8 mg/dL (ref 6–20)
CO2: 18 mmol/L — ABNORMAL LOW (ref 22–32)
Calcium: 9.3 mg/dL (ref 8.9–10.3)
Chloride: 112 mmol/L — ABNORMAL HIGH (ref 98–111)
Creatinine, Ser: 0.67 mg/dL (ref 0.44–1.00)
GFR, Estimated: >60 mL/min (ref >60)
Glucose, Bld: 99 mg/dL (ref 70–99)
Potassium: 4.4 mmol/L (ref 3.5–5.1)
Sodium: 139 mmol/L (ref 135–145)

## 2022-01-31 LAB — CBC WITH DIFFERENTIAL/PLATELET
Abs Immature Granulocytes: 0.03 10*3/uL (ref 0.00–0.07)
Basophils Absolute: 0 10*3/uL (ref 0.0–0.1)
Basophils Relative: 0 %
Eosinophils Absolute: 0.1 10*3/uL (ref 0.0–0.5)
Eosinophils Relative: 1 %
HCT: 35.9 % — ABNORMAL LOW (ref 36.0–46.0)
Hemoglobin: 12.3 g/dL (ref 12.0–15.0)
Immature Granulocytes: 0 %
Lymphocytes Relative: 27 %
Lymphs Abs: 3 10*3/uL (ref 0.7–4.0)
MCH: 24.2 pg — ABNORMAL LOW (ref 26.0–34.0)
MCHC: 34.3 g/dL (ref 30.0–36.0)
MCV: 70.5 fL — ABNORMAL LOW (ref 80.0–100.0)
Monocytes Absolute: 0.8 10*3/uL (ref 0.1–1.0)
Monocytes Relative: 8 %
Neutro Abs: 6.8 10*3/uL (ref 1.7–7.7)
Neutrophils Relative %: 64 %
Platelets: 260 10*3/uL (ref 150–400)
RBC: 5.09 MIL/uL (ref 3.87–5.11)
RDW: 15.3 % (ref 11.5–15.5)
WBC: 10.8 10*3/uL — ABNORMAL HIGH (ref 4.0–10.5)
nRBC: 0.3 % — ABNORMAL HIGH (ref 0.0–0.2)

## 2022-01-31 LAB — RETICULOCYTES
Immature Retic Fract: 29.6 % — ABNORMAL HIGH (ref 2.3–15.9)
RBC.: 5.08 MIL/uL (ref 3.87–5.11)
Retic Count, Absolute: 136.1 10*3/uL (ref 19.0–186.0)
Retic Ct Pct: 2.7 % (ref 0.4–3.1)

## 2022-01-31 MED ORDER — HYDROMORPHONE HCL 1 MG/ML IJ SOLN
1.0000 mg | INTRAMUSCULAR | Status: AC | PRN
  Administered 2022-01-31 (×2): 1 mg via INTRAVENOUS
  Filled 2022-01-31 (×2): qty 1

## 2022-01-31 MED ORDER — FOLIC ACID 1 MG PO TABS: 1.0000 mg | ORAL_TABLET | Freq: Every day | ORAL | Status: DC

## 2022-01-31 MED ORDER — ACETAMINOPHEN 500 MG PO TABS: 1000.0000 mg | ORAL_TABLET | Freq: Four times a day (QID) | ORAL | Status: DC | PRN

## 2022-01-31 MED ORDER — KETOROLAC TROMETHAMINE 15 MG/ML IJ SOLN
15.0000 mg | INTRAMUSCULAR | Status: AC
  Administered 2022-01-31: 15 mg via INTRAVENOUS
  Filled 2022-01-31: qty 1

## 2022-01-31 MED ORDER — SENNOSIDES-DOCUSATE SODIUM 8.6-50 MG PO TABS
1.0000 | ORAL_TABLET | Freq: Two times a day (BID) | ORAL | Status: DC
  Administered 2022-02-02: 1 via ORAL
  Filled 2022-01-31 (×6): qty 1

## 2022-01-31 MED ORDER — DIPHENHYDRAMINE HCL 25 MG PO CAPS: 25.0000 mg | ORAL_CAPSULE | ORAL | Status: DC | PRN

## 2022-01-31 MED ORDER — KETOROLAC TROMETHAMINE 15 MG/ML IJ SOLN
15.0000 mg | Freq: Four times a day (QID) | INTRAMUSCULAR | Status: DC
  Administered 2022-01-31 – 2022-02-04 (×15): 15 mg via INTRAVENOUS
  Filled 2022-01-31 (×15): qty 1

## 2022-01-31 MED ORDER — FOLIC ACID 1 MG PO TABS
1.0000 mg | ORAL_TABLET | Freq: Every day | ORAL | Status: DC
  Administered 2022-02-01 – 2022-02-04 (×4): 1 mg via ORAL
  Filled 2022-01-31 (×4): qty 1

## 2022-01-31 MED ORDER — OXYCODONE HCL 5 MG PO TABS
30.0000 mg | ORAL_TABLET | Freq: Once | ORAL | Status: AC
  Administered 2022-01-31: 30 mg via ORAL
  Filled 2022-01-31: qty 6

## 2022-01-31 MED ORDER — NALOXONE HCL 0.4 MG/ML IJ SOLN: 0.4000 mg | INTRAMUSCULAR | Status: DC | PRN

## 2022-01-31 MED ORDER — ENOXAPARIN SODIUM 40 MG/0.4ML IJ SOSY: 40.0000 mg | PREFILLED_SYRINGE | INTRAMUSCULAR | Status: DC

## 2022-01-31 MED ORDER — HYDROMORPHONE HCL 2 MG/ML IJ SOLN
2.0000 mg | INTRAMUSCULAR | Status: AC
  Administered 2022-01-31: 2 mg via SUBCUTANEOUS
  Filled 2022-01-31: qty 1

## 2022-01-31 MED ORDER — VOXELOTOR 500 MG PO TABS
1500.0000 mg | ORAL_TABLET | Freq: Every day | ORAL | Status: DC
  Administered 2022-02-02 – 2022-02-03 (×2): 1500 mg via ORAL

## 2022-01-31 MED ORDER — ONDANSETRON HCL 4 MG/2ML IJ SOLN: 4.0000 mg | Freq: Four times a day (QID) | INTRAMUSCULAR | Status: DC | PRN

## 2022-01-31 MED ORDER — SODIUM CHLORIDE 0.45 % IV SOLN
INTRAVENOUS | Status: DC
  Administered 2022-02-03: 500 mL via INTRAVENOUS

## 2022-01-31 MED ORDER — DIPHENHYDRAMINE HCL 25 MG PO CAPS
25.0000 mg | ORAL_CAPSULE | ORAL | Status: DC | PRN
  Administered 2022-01-31: 25 mg via ORAL
  Filled 2022-01-31: qty 1

## 2022-01-31 MED ORDER — OXYCODONE HCL ER 20 MG PO T12A
20.0000 mg | EXTENDED_RELEASE_TABLET | Freq: Two times a day (BID) | ORAL | Status: DC
  Administered 2022-01-31 – 2022-02-04 (×6): 20 mg via ORAL
  Filled 2022-01-31 (×2): qty 2
  Filled 2022-01-31 (×5): qty 1

## 2022-01-31 MED ORDER — SODIUM CHLORIDE 0.9% FLUSH: 9.0000 mL | INTRAVENOUS | Status: DC | PRN

## 2022-01-31 MED ORDER — POLYETHYLENE GLYCOL 3350 17 G PO PACK: 17.0000 g | PACK | Freq: Every day | ORAL | Status: DC | PRN

## 2022-01-31 MED ORDER — ONDANSETRON HCL 4 MG/2ML IJ SOLN
4.0000 mg | INTRAMUSCULAR | Status: DC | PRN
  Administered 2022-01-31: 4 mg via INTRAVENOUS
  Filled 2022-01-31: qty 2

## 2022-01-31 MED ORDER — HYDROMORPHONE HCL 1 MG/ML IJ SOLN
1.0000 mg | INTRAMUSCULAR | Status: AC | PRN
  Administered 2022-01-31 – 2022-02-01 (×3): 1 mg via INTRAVENOUS
  Filled 2022-01-31 (×3): qty 1

## 2022-01-31 MED ORDER — ENOXAPARIN SODIUM 80 MG/0.8ML IJ SOSY
75.0000 mg | PREFILLED_SYRINGE | INTRAMUSCULAR | Status: DC
  Administered 2022-01-31 – 2022-02-03 (×4): 75 mg via SUBCUTANEOUS
  Filled 2022-01-31: qty 0.75
  Filled 2022-01-31 (×3): qty 0.8

## 2022-01-31 MED ORDER — OXYCODONE HCL 5 MG PO TABS
30.0000 mg | ORAL_TABLET | ORAL | Status: DC | PRN
  Administered 2022-01-31: 30 mg via ORAL
  Filled 2022-01-31: qty 6

## 2022-01-31 MED ORDER — HYDROMORPHONE 1 MG/ML IV SOLN
INTRAVENOUS | Status: DC
  Administered 2022-02-01: 4 mg via INTRAVENOUS
  Administered 2022-02-01: 30 mg via INTRAVENOUS
  Filled 2022-01-31: qty 30

## 2022-01-31 NOTE — ED Notes (Signed)
IV team bedside. 

## 2022-01-31 NOTE — ED Provider Notes (Signed)
[facility] Provider Note  CSN: [id number] Arrival date & time: 01/31/22 1324  Chief Complaint(s) Sickle Cell Pain Crisis (/)  HPI Stephanie Burch is a 41 y.o. female with past medical history as below, significant for sickle cell disease, chronic pain, depression who presents to the ED with complaint of sickle cell pain. Pt was was discharged yestd 2/2 sickle cell pain crisis. She has been admitted 5 times this month 2/2 sickle cell pain. Pt reports pain today to b/l thigh, c/w her typical sickle cell pain crisis episodes. Taking her opiates at home w/o much relief. No fevers or chills, no falls or trauma. No numbness/tingling to her legs that seems new. No low back pain. No cp or dib, no bleeding of any source that she is aware of. Reports having some diff w/ ambulation 2/2 thigh pain b/l. No fevers / chills/ no dib or  cp  Past Medical History Past Medical History:  Diagnosis Date   Acute kidney injury (HCC) 11/07/2019   Anemia    Chronic pain    COVID-19 virus detected 10/22/2019   Depression    HCAP (healthcare-associated pneumonia) 11/07/2019   History of blood transfusion 2013   x 2   HSV infection    Hypoxia    Pneumonia    x 1   Pulmonary edema 11/11/2019   Sepsis (HCC) 11/07/2019   Sickle cell anemia (HCC) 09/2021   gets them frequently   Tachycardia with heart rate 100-120 beats per minute    Vitamin B12 deficiency 12/2018   Vitamin D deficiency    Patient Active Problem List   Diagnosis Date Noted   Sickle cell anemia with pain (HCC) 01/30/2022   Anemia of chronic disease 01/03/2022   Sickle cell crisis (HCC) 12/07/2021   Sickle cell pain crisis (HCC) 11/21/2021   Pulmonary hypertension (HCC) 11/21/2021   Hypoxia    Poor venous access 11/09/2019   History of COVID-19 11/07/2019   Vaginal odor 04/20/2019   Chest pain varying with breathing    Vitamin D deficiency 11/15/2018   Medication management 09/14/2018   Muscle spasms of both lower  extremities 08/25/2018   Subjective visual disturbance of right eye 08/25/2018   Hb-SS disease without crisis (HCC) 04/13/2018   Chronic pain syndrome 03/16/2018   Chronic, continuous use of opioids 03/16/2018   Leukocytosis 10/19/2017   Thrombocytopenia (HCC) 10/19/2017   BMI 50.0-59.9, adult (HCC) 10/19/2017   Constipation due to pain medication 08/29/2014   Home Medication(s) Prior to Admission medications   Medication Sig Start Date End Date Taking? Authorizing Provider  acetaminophen (TYLENOL) 500 MG tablet Take 1,000 mg by mouth every 6 (six) hours as needed for mild pain, moderate pain or headache.   Yes [provider]  folic acid (FOLVITE) 1 MG tablet Take 1 mg by mouth daily.   Yes [provider]  HYDROmorphone (DILAUDID) 4 MG tablet Take 4 mg by mouth every 6 (six) hours.   Yes [provider]  ibuprofen (ADVIL) 200 MG tablet Take 800 mg by mouth 3 (three) times daily as needed for fever, headache or mild pain.   Yes [provider]  OXBRYTA 500 MG TABS tablet Take 1,500 mg by mouth daily in the afternoon. 10/04/21  Yes [provider]  oxycodone (ROXICODONE) 30 MG immediate release tablet Take 30 mg by mouth every 4 (four) hours.   Yes [provider]  ondansetron (ZOFRAN) 4 MG tablet Take 4 mg by mouth every 8 (  eight) hours as needed for nausea or vomiting. Patient not taking: Reported on 01/17/2022    [provider]                                                                                                                                    Past Surgical History Past Surgical History:  Procedure Laterality Date   CESAREAN SECTION  2013   x 1   TOOTH EXTRACTION N/A 10/18/2021   Procedure: DENTAL RESTORATION/EXTRACTIONS;  Surgeon: Ocie Doyne, DMD;  Location: [facility] OR;  Service: Oral Surgery;  Laterality: N/A;   Family History Family History  Problem Relation Age of Onset   Hypertension Mother    Sickle  cell trait Mother    Diabetes Father    Sickle cell trait Father     Social History Social History   Tobacco Use   Smoking status: Never   Smokeless tobacco: Never  Vaping Use   Vaping Use: Never used  Substance Use Topics   Alcohol use: Not Currently   Drug use: Never   Allergies Patient has no known allergies.  Review of Systems Review of Systems  Constitutional:  Negative for chills and fever.  HENT:  Negative for facial swelling and trouble swallowing.   Eyes:  Negative for photophobia and visual disturbance.  Respiratory:  Negative for cough and shortness of breath.   Cardiovascular:  Negative for chest pain and palpitations.  Gastrointestinal:  Negative for abdominal pain, nausea and vomiting.  Endocrine: Negative for polydipsia and polyuria.  Genitourinary:  Negative for difficulty urinating and hematuria.  Musculoskeletal:  Positive for arthralgias and myalgias. Negative for gait problem and joint swelling.  Skin:  Negative for pallor and rash.  Neurological:  Negative for syncope and headaches.  Psychiatric/Behavioral:  Negative for agitation and confusion.     Physical Exam Vital Signs  I have reviewed the triage vital signs BP 104/62 (BP Location: Right Arm)   Pulse (!) 105   Temp 98.1 F (36.7 C) (Oral)   Resp 14   Ht 5\' 5"  (1.651 m)   Wt (!) 146.1 kg Comment: recent weight verbalized by patient  LMP 12/31/2021 (Exact Date)   SpO2 98%   BMI 53.58 kg/m  Physical Exam Vitals and nursing note reviewed.  Constitutional:      General: She is not in acute distress.    Appearance: Normal appearance. She is obese.  HENT:     Head: Normocephalic and atraumatic.     Right Ear: External ear normal.     Left Ear: External ear normal.     Nose: Nose normal.     Mouth/Throat:     Mouth: Mucous membranes are moist.  Eyes:     General: No scleral icterus.       Right eye: No discharge.        Left eye: No discharge.  Cardiovascular:     Rate and Rhythm:  Normal rate and regular rhythm.     Pulses: Normal pulses.     Heart sounds: Normal heart sounds.  Pulmonary:     Effort: Pulmonary effort is normal. No respiratory distress.     Breath sounds: Normal breath sounds.  Abdominal:     General: Abdomen is flat.     Palpations: Abdomen is soft.     Tenderness: There is no abdominal tenderness. There is no guarding or rebound.  Musculoskeletal:        General: Normal range of motion.     Cervical back: Normal range of motion.     Right lower leg: No edema.     Left lower leg: No edema.     Comments: Pain to b/l thigh, compartments are soft LE NVI  Skin:    General: Skin is warm and dry.     Capillary Refill: Capillary refill takes less than 2 seconds.  Neurological:     Mental Status: She is alert.  Psychiatric:        Mood and Affect: Mood normal.        Behavior: Behavior normal.     ED Results and Treatments Labs (all labs ordered are listed, but only abnormal results are displayed) Labs Reviewed  RETICULOCYTES - Abnormal; Notable for the following components:      Result Value   Immature Retic Fract 29.6 (*)    All other components within normal limits  BASIC METABOLIC PANEL - Abnormal; Notable for the following components:   Chloride 112 (*)    CO2 18 (*)    All other components within normal limits  CBC WITH DIFFERENTIAL/PLATELET - Abnormal; Notable for the following components:   WBC 10.8 (*)    HCT 35.9 (*)    MCV 70.5 (*)    MCH 24.2 (*)    nRBC 0.3 (*)    All other components within normal limits  URINALYSIS, ROUTINE W REFLEX MICROSCOPIC - Abnormal; Notable for the following components:   Bacteria, UA RARE (*)    All other components within normal limits  CBC - Abnormal; Notable for the following components:   Hemoglobin 11.8 (*)    HCT 34.2 (*)    MCV 70.4 (*)    MCH 24.3 (*)    nRBC 0.4 (*)    All other components within normal limits  COMPREHENSIVE METABOLIC PANEL - Abnormal; Notable for the following  components:   Albumin 3.4 (*)    All other components within normal limits  LACTATE DEHYDROGENASE  CBC WITH DIFFERENTIAL/PLATELET                                                                                                                          Radiology No results found.  Pertinent labs & imaging results that were available during my care of the patient were reviewed by me and considered in my medical decision making (see MDM for details).  Medications Ordered in ED Medications  acetaminophen (TYLENOL) tablet  1,000 mg (has no administration in time range)  folic acid (FOLVITE) tablet 1 mg (1 mg Oral Given 02/03/22 1004)  voxelotor (OXBRYTA) tablet 1,500 mg (1,500 mg Oral Given 02/02/22 2145)  senna-docusate (Senokot-S) tablet 1 tablet (1 tablet Oral Not Given 02/03/22 1004)  polyethylene glycol (MIRALAX / GLYCOLAX) packet 17 g (has no administration in time range)  ketorolac (TORADOL) 15 MG/ML injection 15 mg (15 mg Intravenous Given 02/03/22 1409)  0.45 % sodium chloride infusion (500 mLs Intravenous New Bag/Given 02/03/22 1522)  naloxone (NARCAN) injection 0.4 mg (has no administration in time range)    And  sodium chloride flush (NS) 0.9 % injection 9 mL (has no administration in time range)  diphenhydrAMINE (BENADRYL) capsule 25 mg (has no administration in time range)  oxyCODONE (OXYCONTIN) 12 hr tablet 20 mg (20 mg Oral Given 02/03/22 1004)  enoxaparin (LOVENOX) injection 75 mg (75 mg Subcutaneous Given 02/02/22 2145)  ondansetron (ZOFRAN) injection 4 mg (4 mg Intravenous Given 02/02/22 0921)  ondansetron (ZOFRAN) 4 MG/2ML injection (has no administration in time range)  HYDROmorphone (DILAUDID) 1 mg/mL PCA injection (8 mg Intravenous Received 02/03/22 1401)  oxyCODONE (Oxy IR/ROXICODONE) immediate release tablet 20 mg (20 mg Oral Given 02/03/22 1521)  oxyCODONE (Oxy IR/ROXICODONE) immediate release tablet 30 mg (30 mg Oral Given 01/31/22 0906)  ketorolac (TORADOL) 15 MG/ML injection  15 mg (15 mg Intravenous Given 01/31/22 1509)  HYDROmorphone (DILAUDID) injection 2 mg (2 mg Subcutaneous Given 01/31/22 1235)  HYDROmorphone (DILAUDID) injection 2 mg (2 mg Subcutaneous Given 01/31/22 1306)  HYDROmorphone (DILAUDID) injection 2 mg (2 mg Subcutaneous Given 01/31/22 1337)  HYDROmorphone (DILAUDID) injection 1 mg (1 mg Intravenous Given 01/31/22 1919)  HYDROmorphone (DILAUDID) injection 1 mg (1 mg Intravenous Given 02/01/22 0240)                                                                                                                                     Procedures .Critical Care  Performed by: Sloan Leiter, DO Authorized by: Sloan Leiter, DO   Critical care provider statement:    Critical care time (minutes):  30   Critical care time was exclusive of:  Separately billable procedures and treating other patients   Critical care was necessary to treat or prevent imminent or life-threatening deterioration of the following conditions: intractable pain and multiple re-eval.   Critical care was time spent personally by me on the following activities:  Development of treatment plan with patient or surrogate, discussions with consultants, evaluation of patient's response to treatment, examination of patient, ordering and review of laboratory studies, ordering and review of radiographic studies, ordering and performing treatments and interventions, pulse oximetry, re-evaluation of patient's condition, review of old charts and obtaining history from patient or surrogate   Care discussed with: admitting provider     (including critical care time)  Medical Decision Making / ED Course   MDM:  Clancy Gourd  Trabert is a 41 y.o. female with past medical history as above, significant for sickle cell disease which further complicates the presenting complaint. Pt presents to the ED with complaint of b/l thigh, sickle cell pain crisis. The complaint involves an extensive differential  diagnosis and also carries with it a high risk of complications and morbidity.  Serious etiology was considered. Ddx includes but is not limited to: sickle cell pain crisis, vasoocclusive crisis, acute chest, strain, sprain, etc  Pt with multiple recent admissions 2/2 sickle cell pain crisis. She has been taking her home medications w/o much relief per the pt. Went to clinic/day hospital today but they were closed so she came here for evaluation.   Labs/imaging reviewed and was stable, breathing comfortably on ambient air, not septic.   Pt continues to require parenteral analgesia to control her pain, intractable pain 2/2 likely sickle cell pain crisis. Recommend admission, will d/w sickle cell team regarding admit.     On initial assessment the patient is: resting comfortably, NAD, pain to b/l thigh, HDS Vital signs and nursing notes were reviewed  Clinical Course as of 02/03/22 1603  Fri Jan 31, 2022  1228 Ambulate to the restroom w/ steady gait [SG]  1416 Pt continues to report pain unchanged after multiple doses of subq dilaudid, will plan for admission to sickle cell team, labs pending [SG]    Clinical Course User Index [SG] Sloan Leiter, DO     Additional history obtained: -Additional history obtained from na -External records from outside source obtained and reviewed including: Chart review including previous notes, labs, imaging, consultation notes including prior admission documentation, prior ed notes, prior labs/imaging home meds   Lab Tests: -I ordered, reviewed, and interpreted labs.   The pertinent results include:   Labs Reviewed  RETICULOCYTES - Abnormal; Notable for the following components:      Result Value   Immature Retic Fract 29.6 (*)    All other components within normal limits  BASIC METABOLIC PANEL - Abnormal; Notable for the following components:   Chloride 112 (*)    CO2 18 (*)    All other components within normal limits  CBC WITH  DIFFERENTIAL/PLATELET - Abnormal; Notable for the following components:   WBC 10.8 (*)    HCT 35.9 (*)    MCV 70.5 (*)    MCH 24.2 (*)    nRBC 0.3 (*)    All other components within normal limits  URINALYSIS, ROUTINE W REFLEX MICROSCOPIC - Abnormal; Notable for the following components:   Bacteria, UA RARE (*)    All other components within normal limits  CBC - Abnormal; Notable for the following components:   Hemoglobin 11.8 (*)    HCT 34.2 (*)    MCV 70.4 (*)    MCH 24.3 (*)    nRBC 0.4 (*)    All other components within normal limits  COMPREHENSIVE METABOLIC PANEL - Abnormal; Notable for the following components:   Albumin 3.4 (*)    All other components within normal limits  LACTATE DEHYDROGENASE  CBC WITH DIFFERENTIAL/PLATELET    Notable for no acute change, similar to prior   EKG   EKG Interpretation  Date/Time:    Ventricular Rate:    PR Interval:    QRS Duration:   QT Interval:    QTC Calculation:   R Axis:     Text Interpretation:           Imaging Studies ordered: I ordered imaging studies including CXR  On my interpretation imaging demonstrates no acute process on 2 view I independently visualized and interpreted imaging. I agree with the radiologist interpretation   Medicines ordered and prescription drug management: Meds ordered this encounter  Medications   oxyCODONE (Oxy IR/ROXICODONE) immediate release tablet 30 mg   ketorolac (TORADOL) 15 MG/ML injection 15 mg   DISCONTD: ondansetron (ZOFRAN) injection 4 mg   DISCONTD: diphenhydrAMINE (BENADRYL) capsule 25-50 mg   HYDROmorphone (DILAUDID) injection 2 mg   HYDROmorphone (DILAUDID) injection 2 mg   HYDROmorphone (DILAUDID) injection 2 mg   acetaminophen (TYLENOL) tablet 1,000 mg   DISCONTD: oxyCODONE (Oxy IR/ROXICODONE) immediate release tablet 30 mg   folic acid (FOLVITE) tablet 1 mg   voxelotor (OXBRYTA) tablet 1,500 mg   senna-docusate (Senokot-S) tablet 1 tablet   polyethylene glycol  (MIRALAX / GLYCOLAX) packet 17 g   DISCONTD: enoxaparin (LOVENOX) injection 40 mg   ketorolac (TORADOL) 15 MG/ML injection 15 mg   0.45 % sodium chloride infusion   AND Linked Order Group    naloxone (NARCAN) injection 0.4 mg    sodium chloride flush (NS) 0.9 % injection 9 mL   DISCONTD: ondansetron (ZOFRAN) injection 4 mg   diphenhydrAMINE (BENADRYL) capsule 25 mg   DISCONTD: HYDROmorphone (DILAUDID) 1 mg/mL PCA injection    Order Specific Question:   Dose:    Answer:   HYDROmorphone (DILAUDID) PCA for Sickle Cell    Order Specific Question:   PCA Patient Bolus Dose (in mg):    Answer:   0.5    Order Specific Question:   Lockout Interval (in minutes)    Answer:   10    Order Specific Question:   1 Hour Dose Limit (in mg):    Answer:   2    Order Specific Question:   Basal Infusion Rate    Answer:   No   DISCONTD: folic acid (FOLVITE) tablet 1 mg   oxyCODONE (OXYCONTIN) 12 hr tablet 20 mg   HYDROmorphone (DILAUDID) injection 1 mg   enoxaparin (LOVENOX) injection 75 mg   HYDROmorphone (DILAUDID) injection 1 mg   DISCONTD: HYDROmorphone (DILAUDID) injection 2 mg   DISCONTD: HYDROmorphone (DILAUDID) 1 mg/mL PCA injection    Order Specific Question:   Dose:    Answer:   HYDROmorphone (DILAUDID) PCA for Sickle Cell    Order Specific Question:   PCA Patient Bolus Dose (in mg):    Answer:   0.5    Order Specific Question:   Lockout Interval (in minutes)    Answer:   10    Order Specific Question:   1 Hour Dose Limit (in mg):    Answer:   3    Order Specific Question:   Basal Infusion Rate    Answer:   No   ondansetron (ZOFRAN) injection 4 mg   ondansetron (ZOFRAN) 4 MG/2ML injection    Hughey, Cynthia L: cabinet override   HYDROmorphone (DILAUDID) 1 mg/mL PCA injection    Order Specific Question:   Dose:    Answer:   HYDROmorphone (DILAUDID) PCA for Sickle Cell    Order Specific Question:   PCA Patient Bolus Dose (in mg):    Answer:   0.5    Order Specific Question:   Lockout  Interval (in minutes)    Answer:   10    Order Specific Question:   1 Hour Dose Limit (in mg):    Answer:   2    Order Specific Question:   Basal Infusion Rate  Answer:   No   oxyCODONE (Oxy IR/ROXICODONE) immediate release tablet 20 mg    -I have reviewed the patients home medicines and have made adjustments as needed   Consultations Obtained: I requested consultation with the sickle cell service   Cardiac Monitoring: The patient was maintained on a cardiac monitor.  I personally viewed and interpreted the cardiac monitored which showed an underlying rhythm of: NSR  Social Determinants of Health:  Diagnosis or treatment significantly limited by social determinants of health: obesity   Reevaluation: After the interventions noted above, I reevaluated the patient and found that they have stayed the same  Co morbidities that complicate the patient evaluation  Past Medical History:  Diagnosis Date   Acute kidney injury (HCC) 11/07/2019   Anemia    Chronic pain    COVID-19 virus detected 10/22/2019   Depression    HCAP (healthcare-associated pneumonia) 11/07/2019   History of blood transfusion 2013   x 2   HSV infection    Hypoxia    Pneumonia    x 1   Pulmonary edema 11/11/2019   Sepsis (HCC) 11/07/2019   Sickle cell anemia (HCC) 09/2021   gets them frequently   Tachycardia with heart rate 100-120 beats per minute    Vitamin B12 deficiency 12/2018   Vitamin D deficiency       Dispostion: Disposition decision including need for hospitalization was considered, and patient admitted to the hospital.    Final Clinical Impression(s) / ED Diagnoses Final diagnoses:  Sickle cell pain crisis (HCC)  Chronic prescription opiate use  Intractable pain     This chart was dictated using voice recognition software.  Despite best efforts to proofread,  errors can occur which can change the documentation meaning.    Tanda Rockers A, DO 02/03/22 [id number]

## 2022-01-31 NOTE — H&P (Addendum)
History and Physical    Stephanie Burch BWI:[id number] DOB: 1980/10/17 DOA: 01/31/2022  Referring MD/NP/PA: EDP PCP:  Patient coming from: Home  Chief Complaint: pain all over  HPI: Stephanie Burch is a 41/F with sickle cell disease, chronic anemia, chronic pain, narcotic dependence, frequent hospitalizations, she is followed by sickle cell clinic at [facility], has been admitted at least 4 times in the last 1 month, was seen at the hospital yesterday, treated with IV Dilaudid, Toradol, pain had improved and subsequently discharged home, woke up early this morning around 1 AM with severe diffuse pain in both lower legs, right arm and back, which was typical of her sickle cell crisis, she denies any fevers or chills, no nausea vomiting. -Reports using oral Dilaudid every 4 hours as needed and oxycodone 30 mg every 4 as needed as well, she was started on Adakveo infusions earlier this year and has missed last several doses, she is also on Oxbryta.  ED Course: Tachycardic on arrival, vitals improved subsequently, labs largely unchanged from yesterday, hemoglobin stable at 12.3, [place] count is 10.8, creatinine is 0.6,  Review of Systems: As per HPI otherwise 14 point review of systems negative.   Past Medical History:  Diagnosis Date   Acute kidney injury (HCC) 11/07/2019   Anemia    Chronic pain    COVID-19 virus detected 10/22/2019   Depression    HCAP (healthcare-associated pneumonia) 11/07/2019   History of blood transfusion 2013   x 2   HSV infection    Hypoxia    Pneumonia    x 1   Pulmonary edema 11/11/2019   Sepsis (HCC) 11/07/2019   Sickle cell anemia (HCC) 09/2021   gets them frequently   Tachycardia with heart rate 100-120 beats per minute    Vitamin B12 deficiency 12/2018   Vitamin D deficiency     Past Surgical History:  Procedure Laterality Date   CESAREAN SECTION  2013   x 1   TOOTH EXTRACTION N/A 10/18/2021   Procedure: DENTAL  RESTORATION/EXTRACTIONS;  Surgeon: Ocie Doyne, DMD;  Location: [facility] OR;  Service: Oral Surgery;  Laterality: N/A;     reports that she has never smoked. She has never used smokeless tobacco. She reports that she does not currently use alcohol. She reports that she does not use drugs.  No Known Allergies  Family History  Problem Relation Age of Onset   Hypertension Mother    Sickle cell trait Mother    Diabetes Father    Sickle cell trait Father      Prior to Admission medications   Medication Sig Start Date End Date Taking? Authorizing Provider  acetaminophen (TYLENOL) 500 MG tablet Take 1,000 mg by mouth every 6 (six) hours as needed for mild pain, moderate pain or headache.    [provider]  folic acid (FOLVITE) 1 MG tablet Take 1 mg by mouth daily.    [provider]  HYDROmorphone (DILAUDID) 4 MG tablet Take 4 mg by mouth every 6 (six) hours.    [provider]  ibuprofen (ADVIL) 200 MG tablet Take 800 mg by mouth 3 (three) times daily as needed for fever, headache or mild pain.    [provider]  ondansetron (ZOFRAN) 4 MG tablet Take 4 mg by mouth every 8 (eight) hours as needed for nausea or vomiting. Patient not taking: Reported on 01/17/2022    [provider]  OXBRYTA 500 MG TABS tablet Take 1,500 mg by mouth  daily in the afternoon. 10/04/21   [provider]  oxycodone (ROXICODONE) 30 MG immediate release tablet Take 30 mg by mouth every 4 (four) hours as needed for pain.    [provider]    Physical Exam: Vitals:   01/31/22 1130 01/31/22 1200 01/31/22 1240 01/31/22 1330  BP: (!) 137/101 134/83 135/79 131/71  Pulse: 97 88 87 74  Resp: 18 18 18 18   Temp: 98.5 F (36.9 C)     TempSrc: Oral     SpO2: 99% 99% 99% 99%      Constitutional: Obese chronically ill female sitting up in bed, slightly uncomfortable appearing, AAOx3 HEENT: Neck obese unable to assess JVD CVS: S1-S2, regular rhythm, not  tachycardic anymore Lungs: Decreased breath sounds to bases otherwise clear Abdomen: Soft, obese, nontender, bowel sounds present Extremities: Mild tenderness in both lower extremities  Skin: no rashes Neurologic: Moves all ext, no localizing signs Psychiatric: Alert and oriented x 3. Normal mood.   Labs on Admission: I have personally reviewed following labs and imaging studies  CBC: Recent Labs  Lab 01/30/22 0951 01/31/22 1410  WBC 9.9 10.8*  NEUTROABS 6.9 6.8  HGB 13.3 12.3  HCT 39.3 35.9*  MCV 69.9* 70.5*  PLT 295 260   Basic Metabolic Panel: Recent Labs  Lab 01/31/22 1135  NA 139  K 4.4  CL 112*  CO2 18*  GLUCOSE 99  BUN 8  CREATININE 0.67  CALCIUM 9.3   GFR: Estimated Creatinine Clearance: 140.3 mL/min (by C-G formula based on SCr of 0.67 mg/dL). Liver Function Tests: No results for input(s): "AST", "ALT", "ALKPHOS", "BILITOT", "PROT", "ALBUMIN" in the last 168 hours. No results for input(s): "LIPASE", "AMYLASE" in the last 168 hours. No results for input(s): "AMMONIA" in the last 168 hours. Coagulation Profile: No results for input(s): "INR", "PROTIME" in the last 168 hours. Cardiac Enzymes: No results for input(s): "CKTOTAL", "CKMB", "CKMBINDEX", "TROPONINI" in the last 168 hours. BNP (last 3 results) No results for input(s): "PROBNP" in the last 8760 hours. HbA1C: No results for input(s): "HGBA1C" in the last 72 hours. CBG: No results for input(s): "GLUCAP" in the last 168 hours. Lipid Profile: No results for input(s): "CHOL", "HDL", "LDLCALC", "TRIG", "CHOLHDL", "LDLDIRECT" in the last 72 hours. Thyroid Function Tests: No results for input(s): "TSH", "T4TOTAL", "FREET4", "T3FREE", "THYROIDAB" in the last 72 hours. Anemia Panel: No results for input(s): "VITAMINB12", "FOLATE", "FERRITIN", "TIBC", "IRON", "RETICCTPCT" in the last 72 hours. Urine analysis:    Component Value Date/Time   COLORURINE YELLOW 11/21/2021 1323   APPEARANCEUR CLEAR  11/21/2021 1323   LABSPEC 1.016 11/21/2021 1323   PHURINE 5.0 11/21/2021 1323   GLUCOSEU NEGATIVE 11/21/2021 1323   HGBUR NEGATIVE 11/21/2021 1323   BILIRUBINUR NEGATIVE 11/21/2021 1323   BILIRUBINUR Negative 04/27/2019 0935   KETONESUR NEGATIVE 11/21/2021 1323   PROTEINUR 100 (A) 11/21/2021 1323   UROBILINOGEN 1.0 04/27/2019 0935   NITRITE NEGATIVE 11/21/2021 1323   LEUKOCYTESUR NEGATIVE 11/21/2021 1323   Sepsis Labs: @LABRCNTIP (procalcitonin:4,lacticidven:4) )No results found for this or any previous visit (from the past 240 hour(s)).   Radiological Exams on Admission: DG Chest 2 View  Result Date: 01/31/2022 CLINICAL DATA:  Sickle cell crisis. EXAM: CHEST - 2 VIEW COMPARISON:  Same day. FINDINGS: The heart size and mediastinal contours are within normal limits. Both lungs are clear. The visualized skeletal structures are unremarkable. IMPRESSION: No active cardiopulmonary disease. Electronically Signed   By: M.D.   On: 01/31/2022 13:11  DG Chest Portable 1 View  Result Date: 01/31/2022 CLINICAL DATA:  Pain, sickle cell disease EXAM: PORTABLE CHEST 1 VIEW COMPARISON:  Previous studies including chest radiograph done on 12/07/2021 and CT done on 11/21/2021 FINDINGS: Transverse diameter of heart is increased. There are no signs of alveolar pulmonary edema. There is faint homogeneous opacity overlying the medial left lower lung field which appears to extend inferiorly into the upper abdomen. Diaphragmatic margins are preserved. There are linear densities in right lower lung field. Lateral CP angles are clear. There is no pneumothorax. Patient's chin is partly obscuring the apices. IMPRESSION: Cardiomegaly. Linear densities in right lower lung fields may suggest subsegmental atelectasis. There is homogeneous opacity noted overlying the medial aspect of left lower lung field which may be an artifact due to chest wall attenuation. Less likely possibility would be infiltrate in  left lower lobe. Follow-up PA and lateral views of chest should be considered. Electronically Signed   By: Ernie Avena M.D.   On: 01/31/2022 11:55    Assessment/Plan  Sickle cell disease with crisis -Multiple frequent admissions this month, hemoglobin is stable, mild leukocytosis noted -Received multiple doses of Dilaudid in the ED without adequate pain control -Admit to inpatient, start IV fluids, 0.45% saline at 100 mL/h -Opiate tolerant Dilaudid PCA -Add Toradol, folic acid -Resume Oxbryta -Supportive care -CBC, LDH in a.m. -B-HCG pending at the time of admission  Chronic pain, narcotic dependence -Suspect this is contributing to her frequent admissions  -She is on hydromorphone 4 mg p.o. Q4h PRN and oxycodone 30 mg Q4H PRN at baseline without long-acting pain control, she may benefit from scheduled long-acting narcotic -Add OxyContin 20 Mg twice daily    Anemia of chronic disease -Hemoglobin stable and at normal range at this time   DVT prophylaxis: Lovenox Code Status: Full code Admission status: Inpatient  [name] MD [facility]   01/31/2022, 2:52 PM

## 2022-01-31 NOTE — ED Provider Triage Note (Addendum)
Emergency Medicine Provider Triage Evaluation Note  Stephanie Burch , a 41 y.o. female  was evaluated in triage.  Pt complains of pain in legs, [place] crisis, seen in clinic for same yesterday but clinic is closed today. Did not have great pain control when she left the clinic yesterday, went home, meds wore off and can't get control of pain with home meds.  No fevers. Pain similar to prior, no new symptoms.  Tried oxy 30mg , last dose (ran out yesterday), Dilaudid 4mg  (last dose this am at 2am). Review of Systems  Positive: As above Negative: As above  Physical Exam  BP (!) 134/102 (BP Location: Left Wrist)   Pulse (!) 115   Temp 98.6 F (37 C) (Oral)   Resp 18   LMP 12/31/2021 (Exact Date)   SpO2 100%  Gen:   Awake, no distress   Resp:  Normal effort  MSK:   Moves extremities without difficulty  Other:  DP pulses present   Medical Decision Making  Medically screening exam initiated at 8:51 AM.  Appropriate orders placed.  Larna Capelle Hernon was informed that the remainder of the evaluation will be completed by another provider, this initial triage assessment does not replace that evaluation, and the importance of remaining in the ED until their evaluation is complete.     01/02/2022, PA-C 01/31/22 0852    Jeannie Fend, PA-C 01/31/22 [phone]

## 2022-01-31 NOTE — ED Triage Notes (Signed)
Pt arrived via POV, c/o SCC, pain in bilateral legs.

## 2022-02-01 ENCOUNTER — Other Ambulatory Visit: Payer: Self-pay

## 2022-02-01 DIAGNOSIS — D5700 Hb-SS disease with crisis, unspecified: Principal | ICD-10-CM

## 2022-02-01 LAB — URINALYSIS, ROUTINE W REFLEX MICROSCOPIC
Bilirubin Urine: NEGATIVE
Glucose, UA: NEGATIVE mg/dL
Hgb urine dipstick: NEGATIVE
Ketones, ur: NEGATIVE mg/dL
Leukocytes,Ua: NEGATIVE
Nitrite: NEGATIVE
Protein, ur: NEGATIVE mg/dL
Specific Gravity, Urine: 1.01 (ref 1.005–1.030)
pH: 5 (ref 5.0–8.0)

## 2022-02-01 LAB — COMPREHENSIVE METABOLIC PANEL
ALT: 13 U/L (ref 0–44)
AST: 15 U/L (ref 15–41)
Albumin: 3.4 g/dL — ABNORMAL LOW (ref 3.5–5.0)
Alkaline Phosphatase: 63 U/L (ref 38–126)
Anion gap: 8 (ref 5–15)
BUN: 11 mg/dL (ref 6–20)
CO2: 22 mmol/L (ref 22–32)
Calcium: 9 mg/dL (ref 8.9–10.3)
Chloride: 107 mmol/L (ref 98–111)
Creatinine, Ser: 0.65 mg/dL (ref 0.44–1.00)
GFR, Estimated: >60 mL/min (ref >60)
Glucose, Bld: 92 mg/dL (ref 70–99)
Potassium: 4.5 mmol/L (ref 3.5–5.1)
Sodium: 137 mmol/L (ref 135–145)
Total Bilirubin: 1.1 mg/dL (ref 0.3–1.2)
Total Protein: 6.9 g/dL (ref 6.5–8.1)

## 2022-02-01 LAB — LACTATE DEHYDROGENASE: LDH: 154 U/L (ref 98–192)

## 2022-02-01 LAB — CBC
HCT: 34.2 % — ABNORMAL LOW (ref 36.0–46.0)
Hemoglobin: 11.8 g/dL — ABNORMAL LOW (ref 12.0–15.0)
MCH: 24.3 pg — ABNORMAL LOW (ref 26.0–34.0)
MCHC: 34.5 g/dL (ref 30.0–36.0)
MCV: 70.4 fL — ABNORMAL LOW (ref 80.0–100.0)
Platelets: 216 10*3/uL (ref 150–400)
RBC: 4.86 MIL/uL (ref 3.87–5.11)
RDW: 15 % (ref 11.5–15.5)
WBC: 7.4 10*3/uL (ref 4.0–10.5)
nRBC: 0.4 % — ABNORMAL HIGH (ref 0.0–0.2)

## 2022-02-01 MED ORDER — HYDROMORPHONE 1 MG/ML IV SOLN
INTRAVENOUS | Status: DC
  Administered 2022-02-01: 10 mg via INTRAVENOUS
  Administered 2022-02-02: 8.5 mg via INTRAVENOUS
  Administered 2022-02-02: 5 mg via INTRAVENOUS
  Administered 2022-02-02: 7 mg via INTRAVENOUS
  Administered 2022-02-02: 30 mg via INTRAVENOUS
  Administered 2022-02-02: 6.5 mg via INTRAVENOUS
  Filled 2022-02-01: qty 30

## 2022-02-01 MED ORDER — HYDROMORPHONE HCL 2 MG/ML IJ SOLN
2.0000 mg | INTRAMUSCULAR | Status: DC | PRN
  Administered 2022-02-01: 2 mg via INTRAVENOUS
  Filled 2022-02-01: qty 1

## 2022-02-01 NOTE — ED Notes (Signed)
Hollis NP paged for pain meds, PCA pump unavailable in the ED and pt requesting more pain meds.

## 2022-02-01 NOTE — ED Notes (Signed)
ED TO INPATIENT HANDOFF REPORT  ED Nurse Name and Phone #: Delice Bison, RN  S Name/Age/Gender Stephanie Burch 41 y.o. female Room/Bed: WA02/WA02  Code Status   Code Status: Full Code  Home/SNF/Other Home Patient oriented to: self, place, time, and situation Is this baseline? Yes   Triage Complete: Triage complete  Chief Complaint Sickle cell crisis (HCC) [D57.00]  Triage Note Pt arrived via POV, c/o SCC, pain in bilateral legs.    Allergies No Known Allergies  Level of Care/Admitting Diagnosis ED Disposition     ED Disposition  Admit   Condition  --   Comment  Hospital Area: [facility] [[id number]]  Level of Care: Med-Surg [16]  May admit patient to [facility] or [name] if equivalent level of care is available:: No  Covid Evaluation: Asymptomatic - no recent exposure (last 10 days) testing not required  Diagnosis: Sickle cell crisis [facility]) [[id number]]  Admitting Physician: [name] [[id number]]  Attending Physician: [name] [[id number]]  Certification:: I certify this patient will need inpatient services for at least 2 midnights  Estimated Length of Stay: 2          B Medical/Surgery History Past Medical History:  Diagnosis Date   Acute kidney injury (HCC) 11/07/2019   Anemia    Chronic pain    COVID-19 virus detected 10/22/2019   Depression    HCAP (healthcare-associated pneumonia) 11/07/2019   History of blood transfusion 2013   x 2   HSV infection    Hypoxia    Pneumonia    x 1   Pulmonary edema 11/11/2019   Sepsis (HCC) 11/07/2019   Sickle cell anemia (HCC) 09/2021   gets them frequently   Tachycardia with heart rate 100-120 beats per minute    Vitamin B12 deficiency 12/2018   Vitamin D deficiency    Past Surgical History:  Procedure Laterality Date   CESAREAN SECTION  2013   x 1   TOOTH EXTRACTION N/A 10/18/2021   Procedure: DENTAL RESTORATION/EXTRACTIONS;  Surgeon: Ocie Doyne, DMD;  Location: [facility] OR;  Service: Oral  Surgery;  Laterality: N/A;     A IV Location/Drains/Wounds Patient Lines/Drains/Airways Status     Active Line/Drains/Airways     Name Placement date Placement time Site Days   Peripheral IV 01/31/22 20 G 1.25" Posterior;Right Forearm 01/31/22  1405  Forearm  1            Intake/Output Last 24 hours No intake or output data in the 24 hours ending 02/01/22 1134  Labs/Imaging Results for orders placed or performed during the hospital encounter of 01/31/22 (from the past 48 hour(s))  Basic metabolic panel     Status: Abnormal   Collection Time: 01/31/22 11:35 AM  Result Value Ref Range   Sodium 139 135 - 145 mmol/L   Potassium 4.4 3.5 - 5.1 mmol/L   Chloride 112 (H) 98 - 111 mmol/L   CO2 18 (L) 22 - 32 mmol/L   Glucose, Bld 99 70 - 99 mg/dL    Comment: Glucose reference range applies only to samples taken after fasting for at least 8 hours.   BUN 8 6 - 20 mg/dL   Creatinine, Ser 3.26 0.44 - 1.00 mg/dL   Calcium 9.3 8.9 - 71.2 mg/dL   GFR, Estimated >45 >80 mL/min    Comment: (NOTE) Calculated using the CKD-EPI Creatinine Equation (2021)    Anion gap 9 5 - 15    Comment: Performed at [facility], [address] [place], [place] [address]  Reticulocytes     Status: Abnormal   Collection Time: 01/31/22  2:10 PM  Result Value Ref Range   Retic Ct Pct 2.7 0.4 - 3.1 %   RBC. 5.08 3.87 - 5.11 MIL/uL   Retic Count, Absolute 136.1 19.0 - 186.0 K/uL   Immature Retic Fract 29.6 (H) 2.3 - 15.9 %    Comment: Performed at [facility], [address], [place], [place] [address]  CBC with Differential/Platelet     Status: Abnormal   Collection Time: 01/31/22  2:10 PM  Result Value Ref Range   WBC 10.8 (H) 4.0 - 10.5 K/uL   RBC 5.09 3.87 - 5.11 MIL/uL   Hemoglobin 12.3 12.0 - 15.0 g/dL   HCT 82.9 (L) 56.2 - 13.0 %   MCV 70.5 (L) 80.0 - 100.0 fL   MCH 24.2 (L) 26.0 - 34.0 pg   MCHC 34.3 30.0 - 36.0 g/dL   RDW 86.5 78.4 - 69.6 %    Platelets 260 150 - 400 K/uL   nRBC 0.3 (H) 0.0 - 0.2 %   Neutrophils Relative % 64 %   Neutro Abs 6.8 1.7 - 7.7 K/uL   Lymphocytes Relative 27 %   Lymphs Abs 3.0 0.7 - 4.0 K/uL   Monocytes Relative 8 %   Monocytes Absolute 0.8 0.1 - 1.0 K/uL   Eosinophils Relative 1 %   Eosinophils Absolute 0.1 0.0 - 0.5 K/uL   Basophils Relative 0 %   Basophils Absolute 0.0 0.0 - 0.1 K/uL   Immature Granulocytes 0 %   Abs Immature Granulocytes 0.03 0.00 - 0.07 K/uL    Comment: Performed at [facility], [address], [place], [place] [address]  Urinalysis, Routine w reflex microscopic     Status: Abnormal   Collection Time: 01/31/22  7:19 PM  Result Value Ref Range   Color, Urine YELLOW YELLOW   APPearance CLEAR CLEAR   Specific Gravity, Urine 1.010 1.005 - 1.030   pH 5.0 5.0 - 8.0   Glucose, UA NEGATIVE NEGATIVE mg/dL   Hgb urine dipstick NEGATIVE NEGATIVE   Bilirubin Urine NEGATIVE NEGATIVE   Ketones, ur NEGATIVE NEGATIVE mg/dL   Protein, ur NEGATIVE NEGATIVE mg/dL   Nitrite NEGATIVE NEGATIVE   Leukocytes,Ua NEGATIVE NEGATIVE   RBC / HPF 0-5 0 - 5 RBC/hpf   WBC, UA 0-5 0 - 5 WBC/hpf   Bacteria, UA RARE (A) NONE SEEN   Squamous Epithelial / LPF 0-5 0 - 5 /HPF   Mucus PRESENT    Hyaline Casts, UA PRESENT     Comment: Performed at [facility], [address] [place], [place] [address]   DG Chest 2 View  Result Date: 01/31/2022 CLINICAL DATA:  Sickle cell crisis. EXAM: CHEST - 2 VIEW COMPARISON:  Same day. FINDINGS: The heart size and mediastinal contours are within normal limits. Both lungs are clear. The visualized skeletal structures are unremarkable. IMPRESSION: No active cardiopulmonary disease. Electronically Signed   By: Lupita Raider M.D.   On: 01/31/2022 13:11   DG Chest Portable 1 View  Result Date: 01/31/2022 CLINICAL DATA:  Pain, sickle cell disease EXAM: PORTABLE CHEST 1 VIEW COMPARISON:  Previous studies including chest radiograph  done on 12/07/2021 and CT done on 11/21/2021 FINDINGS: Transverse diameter of heart is increased. There are no signs of alveolar pulmonary edema. There is faint homogeneous opacity overlying the medial left lower lung field which appears to extend  inferiorly into the upper abdomen. Diaphragmatic margins are preserved. There are linear densities in right lower lung field. Lateral CP angles are clear. There is no pneumothorax. Patient's chin is partly obscuring the apices. IMPRESSION: Cardiomegaly. Linear densities in right lower lung fields may suggest subsegmental atelectasis. There is homogeneous opacity noted overlying the medial aspect of left lower lung field which may be an artifact due to chest wall attenuation. Less likely possibility would be infiltrate in left lower lobe. Follow-up PA and lateral views of chest should be considered. Electronically Signed   By: Ernie AvenaPalani  Rathinasamy M.D.   On: 01/31/2022 11:55    Pending Labs Unresulted Labs (From admission, onward)     Start     Ordered   02/07/22 0500  Creatinine, serum  (enoxaparin (LOVENOX)    CrCl >/= 30 ml/min)  Weekly,   R     Comments: while on enoxaparin therapy   Question:  Specimen collection method  Answer:  IV Team=IV Team collect   01/31/22 1753   02/01/22 0500  CBC  Tomorrow morning,   R       Question:  Specimen collection method  Answer:  IV Team=IV Team collect   01/31/22 1511   02/01/22 0500  Comprehensive metabolic panel  Tomorrow morning,   R       Question:  Specimen collection method  Answer:  IV Team=IV Team collect   01/31/22 1511   02/01/22 0500  Lactate dehydrogenase  Tomorrow morning,   R       Question:  Specimen collection method  Answer:  IV Team=IV Team collect   01/31/22 1511   01/31/22 1754  CBC  (enoxaparin (LOVENOX)    CrCl >/= 30 ml/min)  Once,   R       Comments: Baseline for enoxaparin therapy IF NOT ALREADY DRAWN.  Notify MD if PLT < 100 K.   Question:  Specimen collection method  Answer:  IV  Team=IV Team collect   01/31/22 1753   01/31/22 1754  Creatinine, serum  (enoxaparin (LOVENOX)    CrCl >/= 30 ml/min)  Once,   R       Comments: Baseline for enoxaparin therapy IF NOT ALREADY DRAWN.   Question:  Specimen collection method  Answer:  IV Team=IV Team collect   01/31/22 1753   01/31/22 1133  hCG, serum, qualitative  Once,   URGENT        01/31/22 1133   01/31/22 0855  CBC WITH DIFFERENTIAL  Once,   STAT        01/31/22 0854            Vitals/Pain Today's Vitals   02/01/22 0954 02/01/22 0956 02/01/22 1132 02/01/22 1133  BP: (!) 109/57     Pulse: 60     Resp: 20     Temp:   98.3 F (36.8 C)   TempSrc:   Oral   SpO2: 100%     PainSc:  10-Worst pain ever  10-Worst pain ever    Isolation Precautions No active isolations  Medications Medications  acetaminophen (TYLENOL) tablet 1,000 mg (has no administration in time range)  folic acid (FOLVITE) tablet 1 mg (1 mg Oral Given 02/01/22 0900)  voxelotor (OXBRYTA) tablet 1,500 mg (has no administration in time range)  senna-docusate (Senokot-S) tablet 1 tablet (1 tablet Oral Patient Refused/Not Given 02/01/22 0900)  polyethylene glycol (MIRALAX / GLYCOLAX) packet 17 g (has no administration in time range)  ketorolac (TORADOL) 15 MG/ML injection 15 mg (  15 mg Intravenous Given 02/01/22 1131)  0.45 % sodium chloride infusion (0 mLs Intravenous Stopped 02/01/22 0327)  naloxone (NARCAN) injection 0.4 mg (has no administration in time range)    And  sodium chloride flush (NS) 0.9 % injection 9 mL (has no administration in time range)  ondansetron (ZOFRAN) injection 4 mg (has no administration in time range)  diphenhydrAMINE (BENADRYL) capsule 25 mg (has no administration in time range)  HYDROmorphone (DILAUDID) 1 mg/mL PCA injection (0 mg Intravenous Hold 02/01/22 1104)  oxyCODONE (OXYCONTIN) 12 hr tablet 20 mg (20 mg Oral Given 02/01/22 0900)  enoxaparin (LOVENOX) injection 75 mg (75 mg Subcutaneous Given 01/31/22 2129)   HYDROmorphone (DILAUDID) injection 2 mg (2 mg Intravenous Given 02/01/22 1100)  oxyCODONE (Oxy IR/ROXICODONE) immediate release tablet 30 mg (30 mg Oral Given 01/31/22 0906)  ketorolac (TORADOL) 15 MG/ML injection 15 mg (15 mg Intravenous Given 01/31/22 1509)  HYDROmorphone (DILAUDID) injection 2 mg (2 mg Subcutaneous Given 01/31/22 1235)  HYDROmorphone (DILAUDID) injection 2 mg (2 mg Subcutaneous Given 01/31/22 1306)  HYDROmorphone (DILAUDID) injection 2 mg (2 mg Subcutaneous Given 01/31/22 1337)  HYDROmorphone (DILAUDID) injection 1 mg (1 mg Intravenous Given 01/31/22 1919)  HYDROmorphone (DILAUDID) injection 1 mg (1 mg Intravenous Given 02/01/22 0240)    Mobility walks Low fall risk   Focused Assessments Cardiac Assessment Handoff:    No results found for: "CKTOTAL", "CKMB", "CKMBINDEX", "TROPONINI" Lab Results  Component Value Date   DDIMER 0.76 (H) 04/02/2021   Does the Patient currently have chest pain? No    R Recommendations: See Admitting Provider Note  Report given to:   Additional Notes:

## 2022-02-01 NOTE — Progress Notes (Signed)
Subjective: Stephanie Burch is a 41 year old female with a medical history significant for sickle cell disease, chronic pain syndrome, opiate dependence and tolerance, history of anemia of chronic disease, morbid obesity, frequent hospitalizations was admitted for sickle cell pain crisis. Patient is complaining of pain primarily to low back and lower extremities, which is consistent with her previous sickle cell pain crisis.  The patient states that pain intensity is 9/10, constant, and throbbing.  Patient transitioned to inpatient unit within the past hour and was just started on IV Dilaudid PCA for pain control. She denies headache, chest pain, shortness of breath, urinary symptoms, nausea, vomiting, or diarrhea.  Objective:  Vital signs in last 24 hours:  Vitals:   02/01/22 1242 02/01/22 1305 02/01/22 1354 02/01/22 1513  BP: 122/80     Pulse: 95     Resp: 16 16  16   Temp: 99.1 F (37.3 C)     TempSrc: Oral     SpO2: 99% 99%  98%  Height:   5\' 5"  (1.651 m)     Intake/Output from previous day:   Intake/Output Summary (Last 24 hours) at 02/01/2022 1600 Last data filed at 02/01/2022 1513 Gross per 24 hour  Intake 1110 ml  Output --  Net 1110 ml    Physical Exam: General: Alert, awake, oriented x3, in no acute distress.  HEENT: [place]/AT PEERL, EOMI Neck: Trachea midline,  no masses, no thyromegal,y no JVD, no carotid bruit OROPHARYNX:  Moist, No exudate/ erythema/lesions.  Heart: Regular rate and rhythm, without murmurs, rubs, gallops, PMI non-displaced, no heaves or thrills on palpation.  Lungs: Clear to auscultation, no wheezing or rhonchi noted. No increased vocal fremitus resonant to percussion  Abdomen: Soft, nontender, nondistended, positive bowel sounds, no masses no hepatosplenomegaly noted..  Neuro: No focal neurological deficits noted cranial nerves II through XII grossly intact. DTRs 2+ bilaterally upper and lower extremities. Strength 5 out of 5 in bilateral upper and  lower extremities. Musculoskeletal: No warm swelling or erythema around joints, no spinal tenderness noted. Psychiatric: Patient alert and oriented x3, good insight and cognition, good recent to remote recall. Lymph node survey: No cervical axillary or inguinal lymphadenopathy noted.  Lab Results:  Basic Metabolic Panel:    Component Value Date/Time   NA 137 02/01/2022 1146   NA 142 09/11/2017 1431   K 4.5 02/01/2022 1146   CL 107 02/01/2022 1146   CO2 22 02/01/2022 1146   BUN 11 02/01/2022 1146   BUN 8 09/11/2017 1431   CREATININE 0.65 02/01/2022 1146   GLUCOSE 92 02/01/2022 1146   CALCIUM 9.0 02/01/2022 1146   CBC:    Component Value Date/Time   WBC 7.4 02/01/2022 1146   HGB 11.8 (L) 02/01/2022 1146   HGB 13.0 09/11/2017 1431   HCT 34.2 (L) 02/01/2022 1146   HCT 38.4 09/11/2017 1431   PLT 216 02/01/2022 1146   PLT 258 09/11/2017 1431   MCV 70.4 (L) 02/01/2022 1146   MCV 75 (L) 09/11/2017 1431   NEUTROABS 6.8 01/31/2022 1410   NEUTROABS 3.9 09/11/2017 1431   LYMPHSABS 3.0 01/31/2022 1410   LYMPHSABS 2.2 09/11/2017 1431   MONOABS 0.8 01/31/2022 1410   EOSABS 0.1 01/31/2022 1410   EOSABS 0.1 09/11/2017 1431   BASOSABS 0.0 01/31/2022 1410   BASOSABS 0.0 09/11/2017 1431    No results found for this or any previous visit (from the past 240 hour(s)).  Studies/Results: DG Chest 2 View  Result Date: 01/31/2022 CLINICAL DATA:  Sickle cell  crisis. EXAM: CHEST - 2 VIEW COMPARISON:  Same day. FINDINGS: The heart size and mediastinal contours are within normal limits. Both lungs are clear. The visualized skeletal structures are unremarkable. IMPRESSION: No active cardiopulmonary disease. Electronically Signed   By: Lupita Raider M.D.   On: 01/31/2022 13:11   DG Chest Portable 1 View  Result Date: 01/31/2022 CLINICAL DATA:  Pain, sickle cell disease EXAM: PORTABLE CHEST 1 VIEW COMPARISON:  Previous studies including chest radiograph done on 12/07/2021 and CT done on  11/21/2021 FINDINGS: Transverse diameter of heart is increased. There are no signs of alveolar pulmonary edema. There is faint homogeneous opacity overlying the medial left lower lung field which appears to extend inferiorly into the upper abdomen. Diaphragmatic margins are preserved. There are linear densities in right lower lung field. Lateral CP angles are clear. There is no pneumothorax. Patient's chin is partly obscuring the apices. IMPRESSION: Cardiomegaly. Linear densities in right lower lung fields may suggest subsegmental atelectasis. There is homogeneous opacity noted overlying the medial aspect of left lower lung field which may be an artifact due to chest wall attenuation. Less likely possibility would be infiltrate in left lower lobe. Follow-up PA and lateral views of chest should be considered. Electronically Signed   By: Ernie Avena M.D.   On: 01/31/2022 11:55    Medications: Scheduled Meds:  enoxaparin (LOVENOX) injection  75 mg Subcutaneous Q24H   folic acid  1 mg Oral Daily   HYDROmorphone   Intravenous Q4H   ketorolac  15 mg Intravenous Q6H   oxyCODONE  20 mg Oral Q12H   senna-docusate  1 tablet Oral BID   voxelotor  1,500 mg Oral Q1500   Continuous Infusions:  sodium chloride 100 mL/hr at 02/01/22 1311   PRN Meds:.acetaminophen, diphenhydrAMINE, naloxone **AND** sodium chloride flush, ondansetron (ZOFRAN) IV, polyethylene glycol  Consultants: none  Procedures: none  Antibiotics: none  Assessment/Plan: Principal Problem:   Sickle cell crisis (HCC) Active Problems:   Chronic pain syndrome   Anemia of chronic disease  Sickle cell disease with pain crisis: Continue IV Dilaudid PCA Toradol 15 mg IV every 6 hours Continue folic acid and Oxbryta Monitor vital signs very closely, reevaluate pain scale regularly, and supplemental oxygen as needed  Chronic pain syndrome: Will hold short acting pain medications, utilize Dilaudid PCA's.  Will restart as pain  intensity improves.  Anemia of chronic disease: Hemoglobin is stable and consistent with patient's baseline.  There is no clinical indication for blood transfusion at this time  Morbid obesity: The patient is asked to make an attempt to improve diet and exercise patterns to aid in medical management of this problem.   Code Status: Full Code Family Communication: N/A Disposition Plan: Not yet ready for discharge Mclane Arora Rennis Petty  APRN, MSN, FNP-C [facility] [address] [place], [place] [address] [phone]  If 7PM-7AM, please contact night-coverage.  02/01/2022, 4:00 PM  LOS: 1 day

## 2022-02-01 NOTE — ED Notes (Signed)
Phlebotomy called for morning labs. This RN unable to get morning labs.

## 2022-02-02 DIAGNOSIS — D5700 Hb-SS disease with crisis, unspecified: Secondary | ICD-10-CM | POA: Diagnosis not present

## 2022-02-02 MED ORDER — ONDANSETRON HCL 4 MG/2ML IJ SOLN
4.0000 mg | Freq: Four times a day (QID) | INTRAMUSCULAR | Status: DC | PRN
  Administered 2022-02-02: 4 mg via INTRAVENOUS

## 2022-02-02 MED ORDER — ONDANSETRON HCL 4 MG/2ML IJ SOLN
INTRAMUSCULAR | Status: AC
  Filled 2022-02-02: qty 2

## 2022-02-02 MED ORDER — OXYCODONE HCL 5 MG PO TABS
20.0000 mg | ORAL_TABLET | ORAL | Status: DC | PRN
  Administered 2022-02-02 – 2022-02-04 (×9): 20 mg via ORAL
  Filled 2022-02-02 (×9): qty 4

## 2022-02-02 MED ORDER — HYDROMORPHONE 1 MG/ML IV SOLN
INTRAVENOUS | Status: DC
  Administered 2022-02-02: 30 mg via INTRAVENOUS
  Administered 2022-02-02 – 2022-02-03 (×2): 11.5 mg via INTRAVENOUS
  Administered 2022-02-03: 5.5 mg via INTRAVENOUS
  Administered 2022-02-03: 8 mg via INTRAVENOUS
  Administered 2022-02-04: 2.5 mg via INTRAVENOUS
  Administered 2022-02-04: 30 mg via INTRAVENOUS
  Administered 2022-02-04: 5.5 mg via INTRAVENOUS
  Filled 2022-02-02 (×3): qty 30

## 2022-02-02 NOTE — Progress Notes (Signed)
Subjective: Stephanie Burch is a 41 year old female with a medical history significant for sickle cell disease, chronic pain syndrome, opiate dependence and tolerance, history of anemia of chronic disease, morbid obesity, frequent hospitalizations was admitted for sickle cell pain crisis. Patient has no new complaints on today.  Pain continues to low back and lower extremity.  She rates pain as 8/10, which is somewhat improved from yesterday. She denies headache, chest pain, shortness of breath, urinary symptoms, nausea, vomiting, or diarrhea.  Objective:  Vital signs in last 24 hours:  Vitals:   02/02/22 0907 02/02/22 1034 02/02/22 1202 02/02/22 1457  BP:  126/79  118/83  Pulse:  100  (!) 104  Resp: 18 18 14 20   Temp:    97.8 F (36.6 C)  TempSrc:    Oral  SpO2: 96% 90% 97% (!) 85%  Weight:      Height:        Intake/Output from previous day:   Intake/Output Summary (Last 24 hours) at 02/02/2022 1520 Last data filed at 02/02/2022 1034 Gross per 24 hour  Intake 788.01 ml  Output --  Net 788.01 ml    Physical Exam: General: Alert, awake, oriented x3, in no acute distress.  HEENT: [place]/AT PEERL, EOMI Neck: Trachea midline,  no masses, no thyromegal,y no JVD, no carotid bruit OROPHARYNX:  Moist, No exudate/ erythema/lesions.  Heart: Regular rate and rhythm, without murmurs, rubs, gallops, PMI non-displaced, no heaves or thrills on palpation.  Lungs: Clear to auscultation, no wheezing or rhonchi noted. No increased vocal fremitus resonant to percussion  Abdomen: Soft, nontender, nondistended, positive bowel sounds, no masses no hepatosplenomegaly noted..  Neuro: No focal neurological deficits noted cranial nerves II through XII grossly intact. DTRs 2+ bilaterally upper and lower extremities. Strength 5 out of 5 in bilateral upper and lower extremities. Musculoskeletal: No warm swelling or erythema around joints, no spinal tenderness noted. Psychiatric: Patient alert and oriented  x3, good insight and cognition, good recent to remote recall. Lymph node survey: No cervical axillary or inguinal lymphadenopathy noted.  Lab Results:  Basic Metabolic Panel:    Component Value Date/Time   NA 137 02/01/2022 1146   NA 142 09/11/2017 1431   K 4.5 02/01/2022 1146   CL 107 02/01/2022 1146   CO2 22 02/01/2022 1146   BUN 11 02/01/2022 1146   BUN 8 09/11/2017 1431   CREATININE 0.65 02/01/2022 1146   GLUCOSE 92 02/01/2022 1146   CALCIUM 9.0 02/01/2022 1146   CBC:    Component Value Date/Time   WBC 7.4 02/01/2022 1146   HGB 11.8 (L) 02/01/2022 1146   HGB 13.0 09/11/2017 1431   HCT 34.2 (L) 02/01/2022 1146   HCT 38.4 09/11/2017 1431   PLT 216 02/01/2022 1146   PLT 258 09/11/2017 1431   MCV 70.4 (L) 02/01/2022 1146   MCV 75 (L) 09/11/2017 1431   NEUTROABS 6.8 01/31/2022 1410   NEUTROABS 3.9 09/11/2017 1431   LYMPHSABS 3.0 01/31/2022 1410   LYMPHSABS 2.2 09/11/2017 1431   MONOABS 0.8 01/31/2022 1410   EOSABS 0.1 01/31/2022 1410   EOSABS 0.1 09/11/2017 1431   BASOSABS 0.0 01/31/2022 1410   BASOSABS 0.0 09/11/2017 1431    No results found for this or any previous visit (from the past 240 hour(s)).  Studies/Results: No results found.  Medications: Scheduled Meds:  enoxaparin (LOVENOX) injection  75 mg Subcutaneous Q24H   folic acid  1 mg Oral Daily   HYDROmorphone   Intravenous Q4H   ketorolac  15 mg Intravenous Q6H   ondansetron       oxyCODONE  20 mg Oral Q12H   senna-docusate  1 tablet Oral BID   voxelotor  1,500 mg Oral Q1500   Continuous Infusions:  sodium chloride 10 mL/hr at 02/02/22 0021   PRN Meds:.acetaminophen, diphenhydrAMINE, naloxone **AND** sodium chloride flush, ondansetron, ondansetron (ZOFRAN) IV, oxyCODONE, polyethylene glycol  Consultants: none  Procedures: none  Antibiotics: none  Assessment/Plan: Principal Problem:   Sickle cell crisis (HCC) Active Problems:   BMI 50.0-59.9, adult (HCC)   Chronic pain syndrome    Anemia of chronic disease  Sickle cell disease with pain crisis: Awaiting IV Dilaudid PCA Oxycodone 20 mg every 4 hours as needed Toradol 15 mg IV every 6 hours Continue folic acid and Oxbryta Monitor vital signs very closely, reevaluate pain scale regularly, and supplemental oxygen as needed  Chronic pain syndrome: Restart oxycodone will restart as pain intensity improves.  Anemia of chronic disease: Hemoglobin is stable and consistent with patient's baseline.  There is no clinical indication for blood transfusion at this time  Morbid obesity: The patient is asked to make an attempt to improve diet and exercise patterns to aid in medical management of this problem.   Code Status: Full Code Family Communication: N/A Disposition Plan: Not yet ready for discharge Shawntel Farnworth Rennis Petty  APRN, MSN, FNP-C [facility] [address] [place], [place] [address] [phone]  If 7PM-7AM, please contact night-coverage.  02/02/2022, 3:20 PM  LOS: 2 days

## 2022-02-03 DIAGNOSIS — D5700 Hb-SS disease with crisis, unspecified: Secondary | ICD-10-CM | POA: Diagnosis not present

## 2022-02-03 NOTE — Progress Notes (Signed)
SICKLE CELL SERVICE PROGRESS NOTE  Stephanie Burch:[id number] DOB: 08/04/1980 DOA: 01/31/2022 PCP: Patient, No Pcp Per  Assessment/Plan: Principal Problem:   Sickle cell crisis ([place]) Active Problems:   BMI 50.0-59.9, adult (HCC)   Chronic pain syndrome   Anemia of chronic disease  Sickle cell pain crisis: Pain has improved to 6 out of 10.  Patient has had multiple admissions this month constantly in the hospital.  Apparently she has not been getting the [place] which may be contributing to her recurrent hospitalizations.  At this point we will continue with the Dilaudid PCA, oxycodone, Toradol and supportive care.. Chronic pain syndrome: Currently on OxyContin 20 mg every 12 hours Anemia of chronic disease: H&H stable at baseline Morbid obesity: Dietary counseling  Code Status: Full code Family Communication: No family at bedside Disposition Plan: [place]  Pager [phone]. If 7PM-7AM, please contact night-coverage.  02/03/2022, 11:38 AM  LOS: 3 days   Brief narrative: Stephanie Burch [place] is a 41/F with sickle cell disease, chronic anemia, chronic pain, narcotic dependence, frequent hospitalizations, she is followed by sickle cell clinic at [facility], has been admitted at least 4 times in the last 1 month, was seen at the hospital yesterday, treated with IV Dilaudid, Toradol, pain had improved and subsequently discharged home, woke up early this morning around 1 AM with severe diffuse pain in both lower legs, right arm and back, which was typical of her sickle cell crisis, she denies any fevers or chills, no nausea vomiting. -Reports using oral Dilaudid every 4 hours as needed and oxycodone 30 mg every 4 as needed as well, she was started on Adakveo infusions earlier this year and has missed last several doses, she is also on Oxbryta.   Consultants: None  Procedures: None  Antibiotics: None  HPI/Subjective: Patient reports pain is 6 out of 10.  She  is otherwise stable at baseline on Dilaudid PCA.  Objective: Vitals:   02/03/22 0045 02/03/22 0413 02/03/22 0626 02/03/22 1002  BP:   110/67   Pulse:   (!) 104   Resp: 17 16 17 16   Temp:   98.8 F (37.1 C)   TempSrc:   Oral   SpO2: 95% 95% 90% 92%  Weight:      Height:       Weight change:   Intake/Output Summary (Last 24 hours) at 02/03/2022 1138 Last data filed at 02/03/2022 1002 Gross per 24 hour  Intake 948 ml  Output --  Net 948 ml    General: Morbidly obese, alert, awake, oriented x3, in no acute distress.  HEENT: [place]/AT PEERL, EOMI Neck: Trachea midline,  no masses, no thyromegal,y no JVD, no carotid bruit OROPHARYNX:  Moist, No exudate/ erythema/lesions.  Heart: Regular rate and rhythm, without murmurs, rubs, gallops, PMI non-displaced, no heaves or thrills on palpation.  Lungs: Clear to auscultation, no wheezing or rhonchi noted. No increased vocal fremitus resonant to percussion  Abdomen: Soft, nontender, nondistended, positive bowel sounds, no masses no hepatosplenomegaly noted..  Neuro: No focal neurological deficits noted cranial nerves II through XII grossly intact. DTRs 2+ bilaterally upper and lower extremities. Strength 5 out of 5 in bilateral upper and lower extremities. Musculoskeletal: No warm swelling or erythema around joints, no spinal tenderness noted. Psychiatric: Patient alert and oriented x3, good insight and cognition, good recent to remote recall. Lymph node survey: No cervical axillary or inguinal lymphadenopathy noted.   Data Reviewed: Basic Metabolic Panel: Recent Labs  Lab 01/31/22  1135 02/01/22 1146  NA 139 137  K 4.4 4.5  CL 112* 107  CO2 18* 22  GLUCOSE 99 92  BUN 8 11  CREATININE 0.67 0.65  CALCIUM 9.3 9.0   Liver Function Tests: Recent Labs  Lab 02/01/22 1146  AST 15  ALT 13  ALKPHOS 63  BILITOT 1.1  PROT 6.9  ALBUMIN 3.4*   No results for input(s): "LIPASE", "AMYLASE" in the last 168 hours. No results for input(s):  "AMMONIA" in the last 168 hours. CBC: Recent Labs  Lab 01/30/22 0951 01/31/22 1410 02/01/22 1146  WBC 9.9 10.8* 7.4  NEUTROABS 6.9 6.8  --   HGB 13.3 12.3 11.8*  HCT 39.3 35.9* 34.2*  MCV 69.9* 70.5* 70.4*  PLT 295 260 216   Cardiac Enzymes: No results for input(s): "CKTOTAL", "CKMB", "CKMBINDEX", "TROPONINI" in the last 168 hours. BNP (last 3 results) Recent Labs    11/21/21 1403  BNP 17.5    ProBNP (last 3 results) No results for input(s): "PROBNP" in the last 8760 hours.  CBG: No results for input(s): "GLUCAP" in the last 168 hours.  No results found for this or any previous visit (from the past 240 hour(s)).   Studies: DG Chest 2 View  Result Date: 01/31/2022 CLINICAL DATA:  Sickle cell crisis. EXAM: CHEST - 2 VIEW COMPARISON:  Same day. FINDINGS: The heart size and mediastinal contours are within normal limits. Both lungs are clear. The visualized skeletal structures are unremarkable. IMPRESSION: No active cardiopulmonary disease. Electronically Signed   By: Marijo Conception M.D.   On: 01/31/2022 13:11   DG Chest Portable 1 View  Result Date: 01/31/2022 CLINICAL DATA:  Pain, sickle cell disease EXAM: PORTABLE CHEST 1 VIEW COMPARISON:  Previous studies including chest radiograph done on 12/07/2021 and CT done on 11/21/2021 FINDINGS: Transverse diameter of heart is increased. There are no signs of alveolar pulmonary edema. There is faint homogeneous opacity overlying the medial left lower lung field which appears to extend inferiorly into the upper abdomen. Diaphragmatic margins are preserved. There are linear densities in right lower lung field. Lateral CP angles are clear. There is no pneumothorax. Patient's chin is partly obscuring the apices. IMPRESSION: Cardiomegaly. Linear densities in right lower lung fields may suggest subsegmental atelectasis. There is homogeneous opacity noted overlying the medial aspect of left lower lung field which may be an artifact due to  chest wall attenuation. Less likely possibility would be infiltrate in left lower lobe. Follow-up PA and lateral views of chest should be considered. Electronically Signed   By: Elmer Picker M.D.   On: 01/31/2022 11:55    Scheduled Meds:  enoxaparin (LOVENOX) injection  75 mg Subcutaneous Y40H   folic acid  1 mg Oral Daily   HYDROmorphone   Intravenous Q4H   ketorolac  15 mg Intravenous Q6H   oxyCODONE  20 mg Oral Q12H   senna-docusate  1 tablet Oral BID   voxelotor  1,500 mg Oral Q1500   Continuous Infusions:  sodium chloride 10 mL/hr at 02/02/22 0021    Principal Problem:   Sickle cell crisis ([place]) Active Problems:   BMI 50.0-59.9, adult (HCC)   Chronic pain syndrome   Anemia of chronic disease

## 2022-02-03 NOTE — Care Management Important Message (Signed)
Important Message  Patient Details IM Letter given. Name: Stephanie Burch MRN: [id number] Date of Birth: 09/26/1980   Medicare Important Message Given:  Yes     Kerin Salen 02/03/2022, 10:31 AM

## 2022-02-04 DIAGNOSIS — D5700 Hb-SS disease with crisis, unspecified: Secondary | ICD-10-CM | POA: Diagnosis not present

## 2022-02-04 NOTE — Progress Notes (Signed)
Patient is being discharged home. Discharge instructions reviewed with patient and medication stored in pharmacy returned to patient. Cleared with MD that patient can drive herself home. Pt verbalized full understanding of instructions.

## 2022-02-04 NOTE — Discharge Summary (Signed)
Physician Discharge Summary  SOPHIAH ROLIN SEG:[id number] DOB: 1980/05/07 DOA: 01/31/2022  PCP: Patient, No Pcp Per  Admit date: 01/31/2022  Discharge date: 02/04/2022  Discharge Diagnoses:  Principal Problem:   Sickle cell crisis ([place]) Active Problems:   BMI 50.0-59.9, adult (HCC)   Chronic pain syndrome   Anemia of chronic disease   Discharge Condition: Stable  Disposition:   Follow-up Information     Tresa Garter, MD Follow up.   Specialty: Internal Medicine Contact information: [place] [address] [phone]                Pt is discharged home in good condition and is to follow up with Patient, No Pcp Per this week to have labs evaluated. [place] Wherry Morgano is instructed to increase activity slowly and balance with rest for the next few days, and use prescribed medication to complete treatment of pain  Diet: Regular Wt Readings from Last 3 Encounters:  02/02/22 (!) 146.1 kg  01/19/22 (!) 149.7 kg  01/03/22 (!) 145.2 kg    History of present illness:    Hospital Course:  Patient was admitted for sickle cell pain crisis and managed appropriately with IVF, IV Dilaudid via PCA and IV Toradol, as well as other adjunct therapies per sickle cell pain management protocols.  Patient was therefore discharged home today in a hemodynamically stable condition.   Shanele will follow-up with PCP within 1 week of this discharge. Jenaye was counseled extensively about nonpharmacologic means of pain management, patient verbalized understanding and was appreciative of  the care received during this admission.   We discussed the need for good hydration, monitoring of hydration status, avoidance of heat, cold, stress, and infection triggers. We discussed the need to be adherent with taking Hydrea and other home medications. Patient was reminded of the need to seek medical attention immediately if any symptom of bleeding, anemia, or infection  occurs.  Discharge Exam: Vitals:   02/04/22 0952 02/04/22 1111  BP: 139/79   Pulse: 84   Resp: 14 18  Temp: 98.4 F (36.9 C)   SpO2: 100% 98%   Vitals:   02/04/22 0538 02/04/22 0719 02/04/22 0952 02/04/22 1111  BP:   139/79   Pulse:   84   Resp: 17 17 14 18   Temp:   98.4 F (36.9 C)   TempSrc:   Oral   SpO2: 97% 96% 100% 98%  Weight:      Height:        General appearance : Awake, alert, not in any distress. Speech Clear. Not toxic looking HEENT: Atraumatic and Normocephalic, pupils equally reactive to light and accomodation Neck: Supple, no JVD. No cervical lymphadenopathy.  Chest: Good air entry bilaterally, no added sounds  CVS: S1 S2 regular, no murmurs.  Abdomen: Bowel sounds present, Non tender and not distended with no gaurding, rigidity or rebound. Extremities: B/L Lower Ext shows no edema, both legs are warm to touch Neurology: Awake alert, and oriented X 3, CN II-XII intact, Non focal Skin: No Rash  Discharge Instructions  Discharge Instructions     Discharge patient   Complete by: As directed    Discharge disposition: 01-Home or Self Care   Discharge patient date: 02/04/2022      Allergies as of 02/04/2022   No Known Allergies      Medication List     TAKE these medications    acetaminophen 500 MG tablet Commonly known as: TYLENOL Take 1,000 mg  by mouth every 6 (six) hours as needed for mild pain, moderate pain or headache.   folic acid 1 MG tablet Commonly known as: FOLVITE Take 1 mg by mouth daily.   HYDROmorphone 4 MG tablet Commonly known as: DILAUDID Take 4 mg by mouth every 6 (six) hours.   ibuprofen 200 MG tablet Commonly known as: ADVIL Take 800 mg by mouth 3 (three) times daily as needed for fever, headache or mild pain.   ondansetron 4 MG tablet Commonly known as: ZOFRAN Take 4 mg by mouth every 8 (eight) hours as needed for nausea or vomiting.   Oxbryta 500 MG Tabs tablet Generic drug: voxelotor Take 1,500 mg by mouth  daily in the afternoon.   oxycodone 30 MG immediate release tablet Commonly known as: ROXICODONE Take 30 mg by mouth every 4 (four) hours.        The results of significant diagnostics from this hospitalization (including imaging, microbiology, ancillary and laboratory) are listed below for reference.    Significant Diagnostic Studies: DG Chest 2 View  Result Date: 01/31/2022 CLINICAL DATA:  Sickle cell crisis. EXAM: CHEST - 2 VIEW COMPARISON:  Same day. FINDINGS: The heart size and mediastinal contours are within normal limits. Both lungs are clear. The visualized skeletal structures are unremarkable. IMPRESSION: No active cardiopulmonary disease. Electronically Signed   By: Marijo Conception M.D.   On: 01/31/2022 13:11   DG Chest Portable 1 View  Result Date: 01/31/2022 CLINICAL DATA:  Pain, sickle cell disease EXAM: PORTABLE CHEST 1 VIEW COMPARISON:  Previous studies including chest radiograph done on 12/07/2021 and CT done on 11/21/2021 FINDINGS: Transverse diameter of heart is increased. There are no signs of alveolar pulmonary edema. There is faint homogeneous opacity overlying the medial left lower lung field which appears to extend inferiorly into the upper abdomen. Diaphragmatic margins are preserved. There are linear densities in right lower lung field. Lateral CP angles are clear. There is no pneumothorax. Patient's chin is partly obscuring the apices. IMPRESSION: Cardiomegaly. Linear densities in right lower lung fields may suggest subsegmental atelectasis. There is homogeneous opacity noted overlying the medial aspect of left lower lung field which may be an artifact due to chest wall attenuation. Less likely possibility would be infiltrate in left lower lobe. Follow-up PA and lateral views of chest should be considered. Electronically Signed   By: Elmer Picker M.D.   On: 01/31/2022 11:55    Microbiology: No results found for this or any previous visit (from the past 240  hour(s)).   Labs: Basic Metabolic Panel: Recent Labs  Lab 01/31/22 1135 02/01/22 1146  NA 139 137  K 4.4 4.5  CL 112* 107  CO2 18* 22  GLUCOSE 99 92  BUN 8 11  CREATININE 0.67 0.65  CALCIUM 9.3 9.0   Liver Function Tests: Recent Labs  Lab 02/01/22 1146  AST 15  ALT 13  ALKPHOS 63  BILITOT 1.1  PROT 6.9  ALBUMIN 3.4*   No results for input(s): "LIPASE", "AMYLASE" in the last 168 hours. No results for input(s): "AMMONIA" in the last 168 hours. CBC: Recent Labs  Lab 01/30/22 0951 01/31/22 1410 02/01/22 1146  WBC 9.9 10.8* 7.4  NEUTROABS 6.9 6.8  --   HGB 13.3 12.3 11.8*  HCT 39.3 35.9* 34.2*  MCV 69.9* 70.5* 70.4*  PLT 295 260 216   Cardiac Enzymes: No results for input(s): "CKTOTAL", "CKMB", "CKMBINDEX", "TROPONINI" in the last 168 hours. BNP: Invalid input(s): "POCBNP" CBG: No results for input(s): "  GLUCAP" in the last 168 hours.  Time coordinating discharge: 50 minutes  Signed:  [facility] 02/04/2022, 12:59 PM

## 2022-02-10 ENCOUNTER — Ambulatory Visit: Payer: Self-pay | Admitting: Nurse Practitioner

## 2022-02-14 ENCOUNTER — Non-Acute Institutional Stay (HOSPITAL_BASED_OUTPATIENT_CLINIC_OR_DEPARTMENT_OTHER)
Admission: AD | Admit: 2022-02-14 | Discharge: 2022-02-14 | Disposition: A | Discharge status: Home / Self Care | Payer: Medicare Other | Source: Ambulatory Visit | Attending: Internal Medicine | Admitting: Internal Medicine

## 2022-02-14 ENCOUNTER — Telehealth (HOSPITAL_COMMUNITY): Payer: Self-pay | Admitting: *Deleted

## 2022-02-14 DIAGNOSIS — D57219 Sickle-cell/Hb-C disease with crisis, unspecified: Secondary | ICD-10-CM | POA: Insufficient documentation

## 2022-02-14 DIAGNOSIS — M79604 Pain in right leg: Secondary | ICD-10-CM | POA: Insufficient documentation

## 2022-02-14 DIAGNOSIS — D5700 Hb-SS disease with crisis, unspecified: Secondary | ICD-10-CM | POA: Diagnosis present

## 2022-02-14 DIAGNOSIS — G894 Chronic pain syndrome: Secondary | ICD-10-CM | POA: Insufficient documentation

## 2022-02-14 DIAGNOSIS — F1120 Opioid dependence, uncomplicated: Secondary | ICD-10-CM | POA: Insufficient documentation

## 2022-02-14 DIAGNOSIS — M79605 Pain in left leg: Secondary | ICD-10-CM | POA: Insufficient documentation

## 2022-02-14 LAB — CBC WITH DIFFERENTIAL/PLATELET
Abs Immature Granulocytes: 0.01 10*3/uL (ref 0.00–0.07)
Basophils Absolute: 0 10*3/uL (ref 0.0–0.1)
Basophils Relative: 0 %
Eosinophils Absolute: 0.2 10*3/uL (ref 0.0–0.5)
Eosinophils Relative: 3 %
HCT: 38.6 % (ref 36.0–46.0)
Hemoglobin: 13.1 g/dL (ref 12.0–15.0)
Immature Granulocytes: 0 %
Lymphocytes Relative: 25 %
Lymphs Abs: 1.8 10*3/uL (ref 0.7–4.0)
MCH: 23.5 pg — ABNORMAL LOW (ref 26.0–34.0)
MCHC: 33.9 g/dL (ref 30.0–36.0)
MCV: 69.3 fL — ABNORMAL LOW (ref 80.0–100.0)
Monocytes Absolute: 0.6 10*3/uL (ref 0.1–1.0)
Monocytes Relative: 8 %
Neutro Abs: 4.6 10*3/uL (ref 1.7–7.7)
Neutrophils Relative %: 64 %
Platelets: 275 10*3/uL (ref 150–400)
RBC: 5.57 MIL/uL — ABNORMAL HIGH (ref 3.87–5.11)
RDW: 14.7 % (ref 11.5–15.5)
WBC: 7.2 10*3/uL (ref 4.0–10.5)
nRBC: 0 % (ref 0.0–0.2)

## 2022-02-14 LAB — COMPREHENSIVE METABOLIC PANEL
ALT: 12 U/L (ref 0–44)
AST: 17 U/L (ref 15–41)
Albumin: 3.8 g/dL (ref 3.5–5.0)
Alkaline Phosphatase: 67 U/L (ref 38–126)
Anion gap: 7 (ref 5–15)
BUN: 8 mg/dL (ref 6–20)
CO2: 22 mmol/L (ref 22–32)
Calcium: 9.3 mg/dL (ref 8.9–10.3)
Chloride: 111 mmol/L (ref 98–111)
Creatinine, Ser: 0.6 mg/dL (ref 0.44–1.00)
GFR, Estimated: >60 mL/min (ref >60)
Glucose, Bld: 101 mg/dL — ABNORMAL HIGH (ref 70–99)
Potassium: 4.9 mmol/L (ref 3.5–5.1)
Sodium: 140 mmol/L (ref 135–145)
Total Bilirubin: 0.6 mg/dL (ref 0.3–1.2)
Total Protein: 7.7 g/dL (ref 6.5–8.1)

## 2022-02-14 LAB — RETICULOCYTES
Immature Retic Fract: 23.2 % — ABNORMAL HIGH (ref 2.3–15.9)
RBC.: 5.49 MIL/uL — ABNORMAL HIGH (ref 3.87–5.11)
Retic Count, Absolute: 155.4 10*3/uL (ref 19.0–186.0)
Retic Ct Pct: 2.8 % (ref 0.4–3.1)

## 2022-02-14 MED ORDER — SODIUM CHLORIDE 0.9% FLUSH: 9.0000 mL | INTRAVENOUS | Status: DC | PRN

## 2022-02-14 MED ORDER — HYDROMORPHONE 1 MG/ML IV SOLN
INTRAVENOUS | Status: DC
  Administered 2022-02-14: 12 mg via INTRAVENOUS
  Administered 2022-02-14: 30 mg via INTRAVENOUS
  Filled 2022-02-14: qty 30

## 2022-02-14 MED ORDER — NALOXONE HCL 0.4 MG/ML IJ SOLN: 0.4000 mg | INTRAMUSCULAR | Status: DC | PRN

## 2022-02-14 MED ORDER — ACETAMINOPHEN 500 MG PO TABS
1000.0000 mg | ORAL_TABLET | Freq: Once | ORAL | Status: AC
  Administered 2022-02-14: 1000 mg via ORAL
  Filled 2022-02-14: qty 2

## 2022-02-14 MED ORDER — KETOROLAC TROMETHAMINE 30 MG/ML IJ SOLN
15.0000 mg | Freq: Once | INTRAMUSCULAR | Status: AC
  Administered 2022-02-14: 15 mg via INTRAVENOUS
  Filled 2022-02-14: qty 1

## 2022-02-14 MED ORDER — DIPHENHYDRAMINE HCL 25 MG PO CAPS: 25.0000 mg | ORAL_CAPSULE | ORAL | Status: DC | PRN

## 2022-02-14 MED ORDER — SODIUM CHLORIDE 0.45 % IV SOLN: INTRAVENOUS | Status: DC

## 2022-02-14 MED ORDER — ONDANSETRON HCL 4 MG/2ML IJ SOLN: 4.0000 mg | Freq: Four times a day (QID) | INTRAMUSCULAR | Status: DC | PRN

## 2022-02-14 NOTE — H&P (Signed)
[facility] History and Physical   Date: 02/14/2022  Patient name: Stephanie Burch Medical record number: [id number] Date of birth: 1981-01-11 Age: 42 y.o. Gender: female PCP: Patient, No Pcp Per  Attending physician: Quentin Angst, MD  Chief Complaint: Sickle cell pain   History of Present Illness: Stephanie Burch is a 42-year-old female with a medical history significant for sickle cell disease type [place], chronic pain syndrome, opiate dependence and tolerance, frequent hospitalizations, and morbid obesity presents with complaints of bilateral lower extremity pain that is consistent with her typical pain crisis.  Patient has been presenting frequently over the past several months for sickle cell related pain.  She states that pain intensity has been increased and not very well-controlled on her home medication regimen.  Patient's medications are managed by her hematology team at [facility].  Patient appears to be on 2 short acting pain medications including oxycodone and hydromorphone.  She last had these medications around 3 AM without very much relief.  She rates her pain at 7/10.  She denies any fever, chills, chest pain, shortness of breath, urinary symptoms, nausea, vomiting, or diarrhea.  Meds: Medications Prior to Admission  Medication Sig Dispense Refill Last Dose   acetaminophen (TYLENOL) 500 MG tablet Take 1,000 mg by mouth every 6 (six) hours as needed for mild pain, moderate pain or headache.   02/13/2022   folic acid (FOLVITE) 1 MG tablet Take 1 mg by mouth daily.   02/13/2022   HYDROmorphone (DILAUDID) 4 MG tablet Take 4 mg by mouth every 6 (six) hours.   02/13/2022   ibuprofen (ADVIL) 200 MG tablet Take 800 mg by mouth 3 (three) times daily as needed for fever, headache or mild pain.   02/13/2022   OXBRYTA 500 MG TABS tablet Take 1,500 mg by mouth daily in the afternoon.   02/13/2022   oxycodone (ROXICODONE) 30 MG immediate release tablet Take 30 mg by mouth  every 4 (four) hours.   02/14/2022   ondansetron (ZOFRAN) 4 MG tablet Take 4 mg by mouth every 8 (eight) hours as needed for nausea or vomiting. (Patient not taking: Reported on 01/17/2022)   Unknown    Allergies: Patient has no known allergies. Past Medical History:  Diagnosis Date   Acute kidney injury (HCC) 11/07/2019   Anemia    Chronic pain    COVID-19 virus detected 10/22/2019   Depression    HCAP (healthcare-associated pneumonia) 11/07/2019   History of blood transfusion 2013   x 2   HSV infection    Hypoxia    Pneumonia    x 1   Pulmonary edema 11/11/2019   Sepsis (HCC) 11/07/2019   Sickle cell anemia (HCC) 09/2021   gets them frequently   Tachycardia with heart rate 100-120 beats per minute    Vitamin B12 deficiency 12/2018   Vitamin D deficiency    Past Surgical History:  Procedure Laterality Date   CESAREAN SECTION  2013   x 1   TOOTH EXTRACTION N/A 10/18/2021   Procedure: DENTAL RESTORATION/EXTRACTIONS;  Surgeon: Ocie Doyne, DMD;  Location: [facility] OR;  Service: Oral Surgery;  Laterality: N/A;   Family History  Problem Relation Age of Onset   Hypertension Mother    Sickle cell trait Mother    Diabetes Father    Sickle cell trait Father    Social History   Socioeconomic History   Marital status: Single    Spouse name: Not on file   Number of children:  Not on file   Years of education: Not on file   Highest education level: Not on file  Occupational History   Not on file  Tobacco Use   Smoking status: Never   Smokeless tobacco: Never  Vaping Use   Vaping Use: Never used  Substance and Sexual Activity   Alcohol use: Not Currently   Drug use: Never   Sexual activity: Yes    Birth control/protection: None  Other Topics Concern   Not on file  Social History Narrative   Not on file   Social Determinants of Health   Financial Resource Strain: Not on file  Food Insecurity: No Food Insecurity (02/02/2022)   Hunger Vital Sign    Worried About Running  Out of Food in the Last Year: Never true    Ran Out of Food in the Last Year: Never true  Transportation Needs: No Transportation Needs (02/02/2022)   PRAPARE - Hydrologist (Medical): No    Lack of Transportation (Non-Medical): No  Physical Activity: Not on file  Stress: Not on file  Social Connections: Not on file  Intimate Partner Violence: Not At Risk (02/02/2022)   Humiliation, Afraid, Rape, and Kick questionnaire    Fear of Current or Ex-Partner: No    Emotionally Abused: No    Physically Abused: No    Sexually Abused: No   Review of Systems  Constitutional: Negative.   HENT: Negative.    Eyes: Negative.   Respiratory: Negative.    Cardiovascular: Negative.   Gastrointestinal: Negative.   Genitourinary: Negative.   Musculoskeletal:  Positive for back pain and joint pain.  Skin: Negative.   Psychiatric/Behavioral: Negative.      Physical Exam: Blood pressure (!) 146/78, pulse 84, temperature 97.6 F (36.4 C), temperature source Temporal, resp. rate 15, last menstrual period 01/30/2022, SpO2 99 %. Physical Exam Constitutional:      Appearance: Normal appearance. She is obese.  Cardiovascular:     Rate and Rhythm: Normal rate and regular rhythm.     Pulses: Normal pulses.  Pulmonary:     Effort: Pulmonary effort is normal.  Abdominal:     General: Bowel sounds are normal.  Musculoskeletal:        General: Normal range of motion.  Skin:    General: Skin is warm.  Neurological:     General: No focal deficit present.     Mental Status: She is alert. Mental status is at baseline.  Psychiatric:        Mood and Affect: Mood normal.        Behavior: Behavior normal.        Thought Content: Thought content normal.        Judgment: Judgment normal.      Lab results: Results for orders placed or performed during the hospital encounter of 02/14/22 (from the past 24 hour(s))  Comprehensive metabolic panel     Status: Abnormal   Collection  Time: 02/14/22 10:35 AM  Result Value Ref Range   Sodium 140 135 - 145 mmol/L   Potassium 4.9 3.5 - 5.1 mmol/L   Chloride 111 98 - 111 mmol/L   CO2 22 22 - 32 mmol/L   Glucose, Bld 101 (H) 70 - 99 mg/dL   BUN 8 6 - 20 mg/dL   Creatinine, Ser 0.60 0.44 - 1.00 mg/dL   Calcium 9.3 8.9 - 10.3 mg/dL   Total Protein 7.7 6.5 - 8.1 g/dL   Albumin 3.8 3.5 -  5.0 g/dL   AST 17 15 - 41 U/L   ALT 12 0 - 44 U/L   Alkaline Phosphatase 67 38 - 126 U/L   Total Bilirubin 0.6 0.3 - 1.2 mg/dL   GFR, Estimated >60 >60 mL/min   Anion gap 7 5 - 15  CBC with Differential     Status: Abnormal   Collection Time: 02/14/22 10:35 AM  Result Value Ref Range   WBC 7.2 4.0 - 10.5 K/uL   RBC 5.57 (H) 3.87 - 5.11 MIL/uL   Hemoglobin 13.1 12.0 - 15.0 g/dL   HCT 38.6 36.0 - 46.0 %   MCV 69.3 (L) 80.0 - 100.0 fL   MCH 23.5 (L) 26.0 - 34.0 pg   MCHC 33.9 30.0 - 36.0 g/dL   RDW 14.7 11.5 - 15.5 %   Platelets 275 150 - 400 K/uL   nRBC 0.0 0.0 - 0.2 %   Neutrophils Relative % 64 %   Neutro Abs 4.6 1.7 - 7.7 K/uL   Lymphocytes Relative 25 %   Lymphs Abs 1.8 0.7 - 4.0 K/uL   Monocytes Relative 8 %   Monocytes Absolute 0.6 0.1 - 1.0 K/uL   Eosinophils Relative 3 %   Eosinophils Absolute 0.2 0.0 - 0.5 K/uL   Basophils Relative 0 %   Basophils Absolute 0.0 0.0 - 0.1 K/uL   Immature Granulocytes 0 %   Abs Immature Granulocytes 0.01 0.00 - 0.07 K/uL   Target Cells PRESENT   Reticulocytes     Status: Abnormal   Collection Time: 02/14/22 10:35 AM  Result Value Ref Range   Retic Ct Pct 2.8 0.4 - 3.1 %   RBC. 5.49 (H) 3.87 - 5.11 MIL/uL   Retic Count, Absolute 155.4 19.0 - 186.0 K/uL   Immature Retic Fract 23.2 (H) 2.3 - 15.9 %    Imaging results:  No results found.   Assessment & Plan: Patient admitted to sickle cell day infusion center for management of pain crisis.  Patient is opiate tolerant Initiate IV dilaudid PCA.  IV fluids, 0.45% saline at 100 ml/hr Toradol 15 mg IV times one dose Tylenol 1000 mg  by mouth times one dose Review CBC with differential, complete metabolic panel, and reticulocytes as results become available. Pain intensity will be reevaluated in context of functioning and relationship to baseline as care progresses If pain intensity remains elevated and/or sudden change in hemodynamic stability transition to inpatient services for higher level of care.     [name]  APRN, MSN, FNP-C [facility] [address] [place], [place] [address] [phone]  02/14/2022, 2:31 PM

## 2022-02-14 NOTE — Telephone Encounter (Signed)
Patient called requesting to come to the day hospital for sickle cell pain. Patient reports bilateral leg pain rated 8/10. Reports taking Oxycodone 30 mg at 3:00 AM. COVID-19 screening done and patient denies all symptom and exposures. Denies fever, chest pain, nausea, vomiting, diarrhea and abdominal pain. Admits to having transportation without driving self. Patient will use Taxi Voucher provided by [facility]. [place], [place] notified. Patient can come to the day hospital for pain management. Patient advised and expresses an understanding.

## 2022-02-14 NOTE — Progress Notes (Signed)
Pt admitted to day hospital today for sickle cell pain treatment. On arrival, pt reports 8/10 pain to left arm. For pain pt received Dilaudid PCA, IV Toradol, and hydrated with IV fluids via PIV. Pt also received PO Tylenol 1,000 mg. At discharge, pt rates pain 6/10. AVS offered, but pt declined. Pt states they have transportation home without driving themselves. Pt alert, oriented and ambulatory at discharge.

## 2022-02-14 NOTE — Discharge Summary (Incomplete)
[facility] Discharge Summary   Patient ID: Stephanie Burch MRN: [id number] DOB/AGE: Dec 20, 1980 42 y.o.  Admit date: 02/14/2022 Discharge date: 02/14/2022  Primary Care Physician:  Patient, No Pcp Per  Admission Diagnoses:  Active Problems:   Sickle cell pain crisis [facility])   Discharge Diagnoses:   ***  Discharge Medications:  Allergies as of 02/14/2022   No Known Allergies      Medication List     TAKE these medications    acetaminophen 500 MG tablet Commonly known as: TYLENOL Take 1,000 mg by mouth every 6 (six) hours as needed for mild pain, moderate pain or headache.   folic acid 1 MG tablet Commonly known as: FOLVITE Take 1 mg by mouth daily.   HYDROmorphone 4 MG tablet Commonly known as: DILAUDID Take 4 mg by mouth every 6 (six) hours.   ibuprofen 200 MG tablet Commonly known as: ADVIL Take 800 mg by mouth 3 (three) times daily as needed for fever, headache or mild pain.   ondansetron 4 MG tablet Commonly known as: ZOFRAN Take 4 mg by mouth every 8 (eight) hours as needed for nausea or vomiting.   Oxbryta 500 MG Tabs tablet Generic drug: voxelotor Take 1,500 mg by mouth daily in the afternoon.   oxycodone 30 MG immediate release tablet Commonly known as: ROXICODONE Take 30 mg by mouth every 4 (four) hours.         Consults:  {consults:18241}  Significant Diagnostic Studies:  DG Chest 2 View  Result Date: 01/31/2022 CLINICAL DATA:  Sickle cell crisis. EXAM: CHEST - 2 VIEW COMPARISON:  Same day. FINDINGS: The heart size and mediastinal contours are within normal limits. Both lungs are clear. The visualized skeletal structures are unremarkable. IMPRESSION: No active cardiopulmonary disease. Electronically Signed   By: Marijo Conception M.D.   On: 01/31/2022 13:11   DG Chest Portable 1 View  Result Date: 01/31/2022 CLINICAL DATA:  Pain, sickle cell disease EXAM: PORTABLE CHEST 1 VIEW COMPARISON:  Previous studies including chest  radiograph done on 12/07/2021 and CT done on 11/21/2021 FINDINGS: Transverse diameter of heart is increased. There are no signs of alveolar pulmonary edema. There is faint homogeneous opacity overlying the medial left lower lung field which appears to extend inferiorly into the upper abdomen. Diaphragmatic margins are preserved. There are linear densities in right lower lung field. Lateral CP angles are clear. There is no pneumothorax. Patient's chin is partly obscuring the apices. IMPRESSION: Cardiomegaly. Linear densities in right lower lung fields may suggest subsegmental atelectasis. There is homogeneous opacity noted overlying the medial aspect of left lower lung field which may be an artifact due to chest wall attenuation. Less likely possibility would be infiltrate in left lower lobe. Follow-up PA and lateral views of chest should be considered. Electronically Signed   By: Elmer Picker M.D.   On: 01/31/2022 11:55     [facility] Course:  For complete details please refer to admission H and P, but in brief, ***  Physical Exam at Discharge:  BP (!) 146/78 (BP Location: Left Arm)   Pulse 84   Temp 97.6 F (36.4 C) (Temporal)   Resp 15   LMP 01/30/2022 (Exact Date)   SpO2 99%   Gen:*** Cardiovascular:*** Respiratory:*** Gastrointestinal:*** Extremities:***   Disposition at Discharge: There are no questions and answers to display.        Discharge Orders:   Condition at Discharge:   Stable  Time spent on Discharge:  Greater than 30 minutes.  Signed: [name] 02/14/2022, 2:36 PM

## 2022-02-15 ENCOUNTER — Emergency Department (HOSPITAL_BASED_OUTPATIENT_CLINIC_OR_DEPARTMENT_OTHER)
Admission: EM | Admit: 2022-02-15 | Discharge: 2022-02-15 | Disposition: A | Discharge status: Home / Self Care | Payer: Medicare Other | Source: Home / Self Care | Attending: Emergency Medicine | Admitting: Emergency Medicine

## 2022-02-15 ENCOUNTER — Encounter (HOSPITAL_BASED_OUTPATIENT_CLINIC_OR_DEPARTMENT_OTHER): Payer: Self-pay

## 2022-02-15 ENCOUNTER — Other Ambulatory Visit: Payer: Self-pay

## 2022-02-15 DIAGNOSIS — G894 Chronic pain syndrome: Secondary | ICD-10-CM | POA: Insufficient documentation

## 2022-02-15 DIAGNOSIS — D5700 Hb-SS disease with crisis, unspecified: Secondary | ICD-10-CM | POA: Insufficient documentation

## 2022-02-15 DIAGNOSIS — M79605 Pain in left leg: Secondary | ICD-10-CM | POA: Insufficient documentation

## 2022-02-15 DIAGNOSIS — M79604 Pain in right leg: Secondary | ICD-10-CM | POA: Insufficient documentation

## 2022-02-15 LAB — CBC WITH DIFFERENTIAL/PLATELET
Abs Immature Granulocytes: 0.02 10*3/uL (ref 0.00–0.07)
Basophils Absolute: 0 10*3/uL (ref 0.0–0.1)
Basophils Relative: 1 %
Eosinophils Absolute: 0.1 10*3/uL (ref 0.0–0.5)
Eosinophils Relative: 2 %
HCT: 38.5 % (ref 36.0–46.0)
Hemoglobin: 13.1 g/dL (ref 12.0–15.0)
Immature Granulocytes: 0 %
Lymphocytes Relative: 25 %
Lymphs Abs: 1.9 10*3/uL (ref 0.7–4.0)
MCH: 23.1 pg — ABNORMAL LOW (ref 26.0–34.0)
MCHC: 34 g/dL (ref 30.0–36.0)
MCV: 67.8 fL — ABNORMAL LOW (ref 80.0–100.0)
Monocytes Absolute: 0.5 10*3/uL (ref 0.1–1.0)
Monocytes Relative: 7 %
Neutro Abs: 4.9 10*3/uL (ref 1.7–7.7)
Neutrophils Relative %: 65 %
Platelets: 273 10*3/uL (ref 150–400)
RBC: 5.68 MIL/uL — ABNORMAL HIGH (ref 3.87–5.11)
RDW: 14.6 % (ref 11.5–15.5)
Smear Review: NORMAL
WBC: 7.4 10*3/uL (ref 4.0–10.5)
nRBC: 0 % (ref 0.0–0.2)

## 2022-02-15 LAB — COMPREHENSIVE METABOLIC PANEL
ALT: 8 U/L (ref 0–44)
AST: 9 U/L — ABNORMAL LOW (ref 15–41)
Albumin: 4.4 g/dL (ref 3.5–5.0)
Alkaline Phosphatase: 74 U/L (ref 38–126)
Anion gap: 10 (ref 5–15)
BUN: 7 mg/dL (ref 6–20)
CO2: 24 mmol/L (ref 22–32)
Calcium: 9.9 mg/dL (ref 8.9–10.3)
Chloride: 106 mmol/L (ref 98–111)
Creatinine, Ser: 0.62 mg/dL (ref 0.44–1.00)
GFR, Estimated: >60 mL/min (ref >60)
Glucose, Bld: 101 mg/dL — ABNORMAL HIGH (ref 70–99)
Potassium: 3.7 mmol/L (ref 3.5–5.1)
Sodium: 140 mmol/L (ref 135–145)
Total Bilirubin: 0.9 mg/dL (ref 0.3–1.2)
Total Protein: 7.8 g/dL (ref 6.5–8.1)

## 2022-02-15 LAB — RETICULOCYTES
Immature Retic Fract: 22.7 % — ABNORMAL HIGH (ref 2.3–15.9)
RBC.: 5.65 MIL/uL — ABNORMAL HIGH (ref 3.87–5.11)
Retic Count, Absolute: 126.6 10*3/uL (ref 19.0–186.0)
Retic Ct Pct: 2.2 % (ref 0.4–3.1)

## 2022-02-15 MED ORDER — HYDROMORPHONE HCL 1 MG/ML IJ SOLN
2.0000 mg | Freq: Once | INTRAMUSCULAR | Status: AC
  Administered 2022-02-15: 2 mg via INTRAVENOUS
  Filled 2022-02-15: qty 2

## 2022-02-15 MED ORDER — HYDROMORPHONE HCL 4 MG PO TABS: 4.0000 mg | ORAL_TABLET | Freq: Four times a day (QID) | ORAL | Status: DC

## 2022-02-15 MED ORDER — KETOROLAC TROMETHAMINE 15 MG/ML IJ SOLN
15.0000 mg | INTRAMUSCULAR | Status: AC
  Administered 2022-02-15: 15 mg via INTRAVENOUS
  Filled 2022-02-15: qty 1

## 2022-02-15 MED ORDER — HYDROMORPHONE HCL 1 MG/ML IJ SOLN: 2.0000 mg | INTRAMUSCULAR | Status: DC

## 2022-02-15 MED ORDER — SODIUM CHLORIDE 0.45 % IV SOLN
INTRAVENOUS | Status: DC
  Administered 2022-02-15: 1000 mL via INTRAVENOUS

## 2022-02-15 MED ORDER — OXYCODONE HCL 5 MG PO TABS
30.0000 mg | ORAL_TABLET | ORAL | Status: DC
  Administered 2022-02-15: 30 mg via ORAL
  Filled 2022-02-15: qty 6

## 2022-02-15 MED ORDER — SODIUM CHLORIDE 0.9 % IV SOLN
12.5000 mg | Freq: Once | INTRAVENOUS | Status: DC
  Filled 2022-02-15: qty 0.25

## 2022-02-15 MED ORDER — ONDANSETRON HCL 4 MG/2ML IJ SOLN
4.0000 mg | INTRAMUSCULAR | Status: DC | PRN
  Administered 2022-02-15: 4 mg via INTRAVENOUS
  Filled 2022-02-15: qty 2

## 2022-02-15 MED ORDER — DIPHENHYDRAMINE HCL 50 MG/ML IJ SOLN
12.5000 mg | Freq: Once | INTRAMUSCULAR | Status: AC
  Administered 2022-02-15: 12.5 mg via INTRAVENOUS
  Filled 2022-02-15: qty 1

## 2022-02-15 MED ORDER — HYDROMORPHONE HCL 1 MG/ML IJ SOLN
2.0000 mg | INTRAMUSCULAR | Status: AC
  Administered 2022-02-15: 2 mg via INTRAVENOUS
  Filled 2022-02-15: qty 2

## 2022-02-15 NOTE — ED Provider Notes (Signed)
[facility] EMERGENCY DEPT Provider Note   CSN: [id number] Arrival date & time: 02/15/22  1557     History  Chief Complaint  Patient presents with   Sickle Cell Pain Crisis    Stephanie Burch is a 42 y.o. female.  Patient complains of pain in both of her legs.  Patient states that the pain is the same as when she has a sickle cell crisis.  Patient reports that she took the last of her home pain medicine today.  Patient reports that she ran out early because she has been having to double up on her dosages.  Patient denies any fever or chills.  Patient denies any cough or any shortness of breath.  Patient was seen yesterday at the sickle cell clinic.  The history is provided by the patient. No language interpreter was used.  Sickle Cell Pain Crisis Location:  Lower extremity Severity:  Severe Onset quality:  Gradual Duration:  1 day Similar to previous crisis episodes: yes   Timing:  Constant Progression:  Worsening Chronicity:  New Worsened by:  Nothing Ineffective treatments:  None tried Risk factors: frequent pain crises        Home Medications Prior to Admission medications   Medication Sig Start Date End Date Taking? Authorizing Provider  acetaminophen (TYLENOL) 500 MG tablet Take 1,000 mg by mouth every 6 (six) hours as needed for mild pain, moderate pain or headache.    [provider]  folic acid (FOLVITE) 1 MG tablet Take 1 mg by mouth daily.    [provider]  HYDROmorphone (DILAUDID) 4 MG tablet Take 4 mg by mouth every 6 (six) hours.    [provider]  ibuprofen (ADVIL) 200 MG tablet Take 800 mg by mouth 3 (three) times daily as needed for fever, headache or mild pain.    [provider]  ondansetron (ZOFRAN) 4 MG tablet Take 4 mg by mouth every 8 (eight) hours as needed for nausea or vomiting. Patient not taking: Reported on 01/17/2022    [provider]  OXBRYTA 500 MG TABS tablet Take 1,500 mg by  mouth daily in the afternoon. 10/04/21   [provider]  oxycodone (ROXICODONE) 30 MG immediate release tablet Take 30 mg by mouth every 4 (four) hours.    [provider]      Allergies    Patient has no known allergies.    Review of Systems   Review of Systems  All other systems reviewed and are negative.   Physical Exam Updated Vital Signs BP (!) 137/94   Pulse 85   Temp 98.7 F (37.1 C) (Oral)   Resp (!) 24   Ht 5\' 5"  (1.651 m)   Wt (!) 146.1 kg   LMP 01/30/2022 (Exact Date)   SpO2 93%   BMI 53.60 kg/m  Physical Exam Vitals and nursing note reviewed.  Constitutional:      Appearance: She is well-developed.  HENT:     Head: Normocephalic.     Mouth/Throat:     Mouth: Mucous membranes are moist.  Cardiovascular:     Rate and Rhythm: Normal rate.  Pulmonary:     Effort: Pulmonary effort is normal.  Abdominal:     General: There is no distension.  Musculoskeletal:        General: Normal range of motion.     Cervical back: Normal range of motion.  Skin:    General: Skin is warm.  Neurological:     General:  No focal deficit present.     Mental Status: She is alert and oriented to person, place, and time.  Psychiatric:        Mood and Affect: Mood normal.     ED Results / Procedures / Treatments   Labs (all labs ordered are listed, but only abnormal results are displayed) Labs Reviewed  CBC WITH DIFFERENTIAL/PLATELET - Abnormal; Notable for the following components:      Result Value   RBC 5.68 (*)    MCV 67.8 (*)    MCH 23.1 (*)    All other components within normal limits  COMPREHENSIVE METABOLIC PANEL - Abnormal; Notable for the following components:   Glucose, Bld 101 (*)    AST 9 (*)    All other components within normal limits  RETICULOCYTES - Abnormal; Notable for the following components:   RBC. 5.65 (*)    Immature Retic Fract 22.7 (*)    All other components within normal limits    EKG None  Radiology No results  found.  Procedures .Critical Care  Performed by: Fransico Meadow, PA-C Authorized by: Fransico Meadow, PA-C   Critical care provider statement:    Critical care time (minutes):  30   Critical care start time:  02/15/2022 5:00 PM   Critical care end time:  02/15/2022 9:46 PM   Critical care time was exclusive of:  Separately billable procedures and treating other patients   Critical care was necessary to treat or prevent imminent or life-threatening deterioration of the following conditions:  Dehydration, metabolic crisis and endocrine crisis   Critical care was time spent personally by me on the following activities:  Review of old charts, pulse oximetry and re-evaluation of patient's condition Comments:     ED course patient given 3 injections of IV Dilaudid patient is given Toradol and Benadryl.  Patient reports feeling much better than when she first arrived.  Patient is out of her home medications and cannot get filled.  Patient is given a dose of her home medications here.  Patient is advised to discuss her chronic pain management with her sickle cell provider.  Patient seems stable for discharge at this time.     Medications Ordered in ED Medications  ondansetron (ZOFRAN) injection 4 mg (4 mg Intravenous Given 02/15/22 1700)  0.45 % sodium chloride infusion (1,000 mLs Intravenous New Bag/Given 02/15/22 2037)  HYDROmorphone (DILAUDID) tablet 4 mg (has no administration in time range)  oxyCODONE (Oxy IR/ROXICODONE) immediate release tablet 30 mg (has no administration in time range)  ketorolac (TORADOL) 15 MG/ML injection 15 mg (15 mg Intravenous Given 02/15/22 1700)  diphenhydrAMINE (BENADRYL) injection 12.5 mg (12.5 mg Intravenous Given 02/15/22 1700)  HYDROmorphone (DILAUDID) injection 2 mg (2 mg Intravenous Given 02/15/22 1701)  HYDROmorphone (DILAUDID) injection 2 mg (2 mg Intravenous Given 02/15/22 1915)  HYDROmorphone (DILAUDID) injection 2 mg (2 mg Intravenous Given 02/15/22 2030)     ED Course/ Medical Decision Making/ A&P                             Medical Decision Making Patient complains of a sickle cell crisis patient reports she has pain in both of her legs  Amount and/or Complexity of Data Reviewed External Data Reviewed: notes.    Details: Cell clinic notes reviewed patient was seen yesterday Labs: ordered. Decision-making details documented in ED Course.    Details: Labs ordered reviewed and interpreted patient has a normal  hemoglobin patient has a normal retake count  Risk Prescription drug management. Parenteral controlled substances. Decision regarding hospitalization. Risk Details: Patient does not want to be hospitalized.  She is stable at this time for discharge.  Patient is advised to return to the hospital if symptoms worsen or change.           Final Clinical Impression(s) / ED Diagnoses Final diagnoses:  Sickle cell pain crisis ([place])  Chronic pain syndrome    Rx / DC Orders ED Discharge Orders     None      An After Visit Summary was printed and given to the patient.    Fransico Meadow, Hershal Coria 02/15/22 2151    Tretha Sciara, MD 02/16/22 1529

## 2022-02-15 NOTE — ED Triage Notes (Signed)
Patient here POV from Home.  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 Visited Pain Clinic yesterday and had PCA Pump but Relief is wearing off and Home Medications are not relieving Pain. No Fevers. No N/V.   NAD Noted During Triage. A&Ox4. GCS 15. Ambulatory.

## 2022-02-15 NOTE — ED Provider Triage Note (Signed)
Emergency Medicine Provider Triage Evaluation Note  Stephanie Burch , a 42 y.o. female  was evaluated in triage.  Pt complains of a sickle cell crisis.  Pt reports pain in both legs   Review of Systems  Positive: Pain  Negative: fever  Physical Exam  BP (!) 150/109 (BP Location: Right Arm)   Pulse 95   Temp 98.7 F (37.1 C) (Oral)   Resp 18   Ht 5\' 5"  (1.651 m)   Wt (!) 146.1 kg   LMP 01/30/2022 (Exact Date)   SpO2 98%   BMI 53.60 kg/m  Gen:   Awake, no distress   Resp:  Normal effort  MSK:   Moves extremities without difficulty  Other:    Medical Decision Making  Medically screening exam initiated at 4:18 PM.  Appropriate orders placed.  Caroleen Stoermer Hamman was informed that the remainder of the evaluation will be completed by another provider, this initial triage assessment does not replace that evaluation, and the importance of remaining in the ED until their evaluation is complete.     Fransico Meadow, [place] 02/15/22 3212

## 2022-02-17 ENCOUNTER — Inpatient Hospital Stay (HOSPITAL_COMMUNITY)
Admission: EM | Admit: 2022-02-17 | Discharge: 2022-02-18 | DRG: 812 | Disposition: A | Discharge status: Home / Self Care | Payer: Medicare Other | Attending: Internal Medicine | Admitting: Internal Medicine

## 2022-02-17 ENCOUNTER — Encounter (HOSPITAL_COMMUNITY): Payer: Self-pay | Admitting: Emergency Medicine

## 2022-02-17 ENCOUNTER — Other Ambulatory Visit: Payer: Self-pay

## 2022-02-17 DIAGNOSIS — Z79899 Other long term (current) drug therapy: Secondary | ICD-10-CM | POA: Diagnosis not present

## 2022-02-17 DIAGNOSIS — G894 Chronic pain syndrome: Secondary | ICD-10-CM | POA: Diagnosis present

## 2022-02-17 DIAGNOSIS — Z8249 Family history of ischemic heart disease and other diseases of the circulatory system: Secondary | ICD-10-CM

## 2022-02-17 DIAGNOSIS — D57219 Sickle-cell/Hb-C disease with crisis, unspecified: Secondary | ICD-10-CM | POA: Diagnosis present

## 2022-02-17 DIAGNOSIS — I272 Pulmonary hypertension, unspecified: Secondary | ICD-10-CM | POA: Diagnosis present

## 2022-02-17 DIAGNOSIS — D5700 Hb-SS disease with crisis, unspecified: Secondary | ICD-10-CM | POA: Diagnosis present

## 2022-02-17 DIAGNOSIS — Z832 Family history of diseases of the blood and blood-forming organs and certain disorders involving the immune mechanism: Secondary | ICD-10-CM

## 2022-02-17 DIAGNOSIS — Z8616 Personal history of COVID-19: Secondary | ICD-10-CM

## 2022-02-17 DIAGNOSIS — Z833 Family history of diabetes mellitus: Secondary | ICD-10-CM

## 2022-02-17 DIAGNOSIS — Z79891 Long term (current) use of opiate analgesic: Secondary | ICD-10-CM

## 2022-02-17 DIAGNOSIS — D638 Anemia in other chronic diseases classified elsewhere: Secondary | ICD-10-CM | POA: Diagnosis present

## 2022-02-17 DIAGNOSIS — Z6841 Body Mass Index (BMI) 40.0 and over, adult: Secondary | ICD-10-CM

## 2022-02-17 LAB — COMPREHENSIVE METABOLIC PANEL
ALT: 11 U/L (ref 0–44)
AST: 11 U/L — ABNORMAL LOW (ref 15–41)
Albumin: 3.8 g/dL (ref 3.5–5.0)
Alkaline Phosphatase: 60 U/L (ref 38–126)
Anion gap: 10 (ref 5–15)
BUN: 6 mg/dL (ref 6–20)
CO2: 21 mmol/L — ABNORMAL LOW (ref 22–32)
Calcium: 9.1 mg/dL (ref 8.9–10.3)
Chloride: 109 mmol/L (ref 98–111)
Creatinine, Ser: 0.68 mg/dL (ref 0.44–1.00)
GFR, Estimated: >60 mL/min (ref >60)
Glucose, Bld: 101 mg/dL — ABNORMAL HIGH (ref 70–99)
Potassium: 3.5 mmol/L (ref 3.5–5.1)
Sodium: 140 mmol/L (ref 135–145)
Total Bilirubin: 0.7 mg/dL (ref 0.3–1.2)
Total Protein: 7.1 g/dL (ref 6.5–8.1)

## 2022-02-17 LAB — CBC WITH DIFFERENTIAL/PLATELET
Abs Immature Granulocytes: 0.03 10*3/uL (ref 0.00–0.07)
Basophils Absolute: 0 10*3/uL (ref 0.0–0.1)
Basophils Relative: 0 %
Eosinophils Absolute: 0.3 10*3/uL (ref 0.0–0.5)
Eosinophils Relative: 2 %
HCT: 38.3 % (ref 36.0–46.0)
Hemoglobin: 12.9 g/dL (ref 12.0–15.0)
Immature Granulocytes: 0 %
Lymphocytes Relative: 29 %
Lymphs Abs: 3.1 10*3/uL (ref 0.7–4.0)
MCH: 23.7 pg — ABNORMAL LOW (ref 26.0–34.0)
MCHC: 33.7 g/dL (ref 30.0–36.0)
MCV: 70.4 fL — ABNORMAL LOW (ref 80.0–100.0)
Monocytes Absolute: 1 10*3/uL (ref 0.1–1.0)
Monocytes Relative: 9 %
Neutro Abs: 6.2 10*3/uL (ref 1.7–7.7)
Neutrophils Relative %: 60 %
Platelets: 258 10*3/uL (ref 150–400)
RBC: 5.44 MIL/uL — ABNORMAL HIGH (ref 3.87–5.11)
RDW: 14.6 % (ref 11.5–15.5)
WBC: 10.5 10*3/uL (ref 4.0–10.5)
nRBC: 0.3 % — ABNORMAL HIGH (ref 0.0–0.2)

## 2022-02-17 LAB — RETICULOCYTES
Immature Retic Fract: 19.9 % — ABNORMAL HIGH (ref 2.3–15.9)
RBC.: 5.4 MIL/uL — ABNORMAL HIGH (ref 3.87–5.11)
Retic Count, Absolute: 121 10*3/uL (ref 19.0–186.0)
Retic Ct Pct: 2.2 % (ref 0.4–3.1)

## 2022-02-17 LAB — URINALYSIS, ROUTINE W REFLEX MICROSCOPIC
Bilirubin Urine: NEGATIVE
Glucose, UA: NEGATIVE mg/dL
Hgb urine dipstick: NEGATIVE
Ketones, ur: NEGATIVE mg/dL
Leukocytes,Ua: NEGATIVE
Nitrite: NEGATIVE
Protein, ur: NEGATIVE mg/dL
Specific Gravity, Urine: 1.013 (ref 1.005–1.030)
pH: 5 (ref 5.0–8.0)

## 2022-02-17 LAB — I-STAT BETA HCG BLOOD, ED (MC, WL, AP ONLY): I-stat hCG, quantitative: <5 m[IU]/mL (ref <5)

## 2022-02-17 LAB — TROPONIN I (HIGH SENSITIVITY)
Troponin I (High Sensitivity): 2 ng/L (ref <18)
Troponin I (High Sensitivity): 2 ng/L (ref <18)

## 2022-02-17 MED ORDER — OXYCODONE HCL 5 MG PO TABS
30.0000 mg | ORAL_TABLET | ORAL | Status: DC
  Administered 2022-02-17 – 2022-02-18 (×5): 30 mg via ORAL
  Filled 2022-02-17 (×5): qty 6

## 2022-02-17 MED ORDER — KETOROLAC TROMETHAMINE 15 MG/ML IJ SOLN
15.0000 mg | INTRAMUSCULAR | Status: AC
  Administered 2022-02-17: 15 mg via INTRAVENOUS
  Filled 2022-02-17: qty 1

## 2022-02-17 MED ORDER — KETOROLAC TROMETHAMINE 15 MG/ML IJ SOLN
15.0000 mg | Freq: Four times a day (QID) | INTRAMUSCULAR | Status: DC
  Administered 2022-02-17 – 2022-02-18 (×4): 15 mg via INTRAVENOUS
  Filled 2022-02-17 (×4): qty 1

## 2022-02-17 MED ORDER — NALOXONE HCL 0.4 MG/ML IJ SOLN: 0.4000 mg | INTRAMUSCULAR | Status: DC | PRN

## 2022-02-17 MED ORDER — SENNOSIDES-DOCUSATE SODIUM 8.6-50 MG PO TABS
1.0000 | ORAL_TABLET | Freq: Two times a day (BID) | ORAL | Status: DC
  Filled 2022-02-17 (×2): qty 1

## 2022-02-17 MED ORDER — HYDROMORPHONE HCL 2 MG/ML IJ SOLN
2.0000 mg | INTRAMUSCULAR | Status: AC
  Administered 2022-02-17: 2 mg via INTRAVENOUS
  Filled 2022-02-17: qty 1

## 2022-02-17 MED ORDER — POLYETHYLENE GLYCOL 3350 17 G PO PACK: 17.0000 g | PACK | Freq: Every day | ORAL | Status: DC | PRN

## 2022-02-17 MED ORDER — HYDROMORPHONE 1 MG/ML IV SOLN
INTRAVENOUS | Status: DC
  Administered 2022-02-17: 30 mg via INTRAVENOUS
  Administered 2022-02-17: 5.5 mg via INTRAVENOUS
  Administered 2022-02-18: 30 mg via INTRAVENOUS
  Administered 2022-02-18: 7.5 mg via INTRAVENOUS
  Administered 2022-02-18: 9.5 mg via INTRAVENOUS
  Filled 2022-02-17 (×2): qty 30

## 2022-02-17 MED ORDER — DIPHENHYDRAMINE HCL 25 MG PO CAPS: 25.0000 mg | ORAL_CAPSULE | ORAL | Status: DC | PRN

## 2022-02-17 MED ORDER — DEXTROSE-NACL 5-0.45 % IV SOLN: INTRAVENOUS | Status: DC

## 2022-02-17 MED ORDER — SODIUM CHLORIDE 0.9% FLUSH: 9.0000 mL | INTRAVENOUS | Status: DC | PRN

## 2022-02-17 MED ORDER — ONDANSETRON HCL 4 MG/2ML IJ SOLN
4.0000 mg | Freq: Four times a day (QID) | INTRAMUSCULAR | Status: DC | PRN
  Administered 2022-02-17 – 2022-02-18 (×2): 4 mg via INTRAVENOUS
  Filled 2022-02-17 (×2): qty 2

## 2022-02-17 MED ORDER — FOLIC ACID 1 MG PO TABS
1.0000 mg | ORAL_TABLET | Freq: Every day | ORAL | Status: DC
  Administered 2022-02-17 – 2022-02-18 (×2): 1 mg via ORAL
  Filled 2022-02-17 (×2): qty 1

## 2022-02-17 MED ORDER — ENOXAPARIN SODIUM 40 MG/0.4ML IJ SOSY
40.0000 mg | PREFILLED_SYRINGE | Freq: Every day | INTRAMUSCULAR | Status: DC
  Administered 2022-02-17: 40 mg via SUBCUTANEOUS
  Filled 2022-02-17: qty 0.4

## 2022-02-17 MED ORDER — VOXELOTOR 500 MG PO TABS: 1500.0000 mg | ORAL_TABLET | Freq: Every day | ORAL | Status: DC

## 2022-02-17 MED ORDER — OXYCODONE-ACETAMINOPHEN 5-325 MG PO TABS
2.0000 | ORAL_TABLET | Freq: Once | ORAL | Status: AC
  Administered 2022-02-17: 2 via ORAL
  Filled 2022-02-17: qty 2

## 2022-02-17 MED ORDER — OXYCODONE HCL 5 MG PO TABS
30.0000 mg | ORAL_TABLET | Freq: Once | ORAL | Status: AC
  Administered 2022-02-17: 30 mg via ORAL
  Filled 2022-02-17: qty 6

## 2022-02-17 MED ORDER — HYDROMORPHONE HCL 2 MG/ML IJ SOLN
2.0000 mg | INTRAMUSCULAR | Status: AC | PRN
  Administered 2022-02-17 (×3): 2 mg via INTRAVENOUS
  Filled 2022-02-17 (×3): qty 1

## 2022-02-17 MED ORDER — HYDROMORPHONE HCL 2 MG/ML IJ SOLN
2.0000 mg | INTRAMUSCULAR | Status: AC | PRN
  Administered 2022-02-17: 2 mg via INTRAVENOUS
  Filled 2022-02-17: qty 1

## 2022-02-17 NOTE — ED Triage Notes (Signed)
Pt here from home with c/o sickle cell pain legs and arm , she ran out of her meds and will not be able to get them until  Tuesday

## 2022-02-17 NOTE — H&P (Signed)
History and Physical    Patient: Stephanie Burch YJE:[id number] DOB: 1980-08-13 DOA: 02/17/2022 DOS: the patient was seen and examined on 02/17/2022 PCP: Patient, No Pcp Per  Patient coming from: {Point_of_Origin:[address]}  Chief Complaint: No chief complaint on file.  HPI: Stephanie Burch is a 42 y.o. 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 She has never used smokeless tobacco. She reports that she does not currently use alcohol. She reports that she does not use drugs.  No Known Allergies  Family History  Problem Relation Age of Onset   Hypertension Mother    Sickle cell trait Mother    Diabetes Father    Sickle cell trait Father     Prior to Admission medications   Medication Sig Start Date End Date Taking? Authorizing Provider  acetaminophen (TYLENOL) 500 MG tablet Take 1,000 mg by mouth every 6 (six) hours as needed for mild pain, moderate pain or headache.   Yes [provider]  folic acid (FOLVITE) 1 MG tablet Take 1 mg by mouth daily.   Yes [provider]  HYDROmorphone (DILAUDID) 4 MG  tablet Take 4 mg by mouth every 6 (six) hours.   Yes [provider]  ibuprofen (ADVIL) 200 MG tablet Take 800 mg by mouth 3 (three) times daily as needed for fever, headache or mild pain.   Yes [provider]  OXBRYTA 500 MG TABS tablet Take 1,500 mg by mouth daily in the afternoon. 10/04/21  Yes [provider]  oxycodone (ROXICODONE) 30 MG immediate release tablet Take 30 mg by mouth every 4 (four) hours.    [provider]    Physical Exam: Vitals:   02/17/22 0300 02/17/22 0630 02/17/22 0635 02/17/22 0727  BP: (!) 137/92  (!) 141/87 126/79  Pulse: 77  94 85  Resp: 16   (!) 22  Temp:  98 F (36.7 C)    TempSrc:  Oral    SpO2: 96%  96% 98%   *** Data Reviewed: {Tip this will not be part of the note when signed- Document your independent interpretation of telemetry tracing, EKG, lab, Radiology test or any other diagnostic tests. Add any new diagnostic test ordered today. (Optional):26781} {Results:26384}  Assessment and Plan: No notes have been filed under this hospital service. Service: Hospitalist     Advance Care Planning:   Code Status: Full Code ***  Consults: ***  Family Communication: ***  Severity of Illness: {Observation/Inpatient:21159}  AuthorBarbette Merino, MD 02/17/2022 8:50 AM  For on call review [email or web address].

## 2022-02-17 NOTE — ED Provider Notes (Signed)
[facility] DEPT Provider Note   CSN: [id number] Arrival date & time: 02/17/22  0000     History  No chief complaint on file.   Stephanie Burch is a 42 y.o. female.  Patient presents with typical sickle cell pain involving her lower extremities for the past 2 days.  States she took the last of her home pain medications on January 13 and is not able to fill them again until January 16.  She takes oxycodone and Dilaudid on a regular basis.  States the pain is to her bilateral lower extremities and is typical of her usual sickle cell pain.  Denies any fevers, chills, nausea, vomiting, chest pain or shortness of breath.  No bowel or bladder incontinence.  No fever.  No back pain.  She was seen in the ED 2 days ago for similar pain but states she felt better at the time.  The history is provided by the patient.       Home Medications Prior to Admission medications   Medication Sig Start Date End Date Taking? Authorizing Provider  acetaminophen (TYLENOL) 500 MG tablet Take 1,000 mg by mouth every 6 (six) hours as needed for mild pain, moderate pain or headache.    [provider]  folic acid (FOLVITE) 1 MG tablet Take 1 mg by mouth daily.    [provider]  HYDROmorphone (DILAUDID) 4 MG tablet Take 4 mg by mouth every 6 (six) hours.    [provider]  ibuprofen (ADVIL) 200 MG tablet Take 800 mg by mouth 3 (three) times daily as needed for fever, headache or mild pain.    [provider]  ondansetron (ZOFRAN) 4 MG tablet Take 4 mg by mouth every 8 (eight) hours as needed for nausea or vomiting. Patient not taking: Reported on 01/17/2022    [provider]  OXBRYTA 500 MG TABS tablet Take 1,500 mg by mouth daily in the afternoon. 10/04/21   [provider]  oxycodone (ROXICODONE) 30 MG immediate release tablet Take 30 mg by mouth every 4 (four) hours.    [provider]      Allergies    Patient  has no known allergies.    Review of Systems   Review of Systems  Constitutional:  Negative for activity change, appetite change and fever.  HENT:  Negative for congestion and rhinorrhea.   Respiratory:  Negative for cough, chest tightness and shortness of breath.   Cardiovascular:  Negative for chest pain.  Gastrointestinal:  Negative for abdominal pain, nausea and vomiting.  Genitourinary:  Negative for dysuria and pelvic pain.  Musculoskeletal:  Positive for arthralgias and myalgias.  Neurological:  Negative for weakness and headaches.   all other systems are negative except as noted in the HPI and PMH.    Physical Exam Updated Vital Signs BP (!) 143/93   Pulse (!) 103   Temp 98.6 F (37 C) (Oral)   Resp 19   LMP 01/30/2022 (Exact Date)   SpO2 100%  Physical Exam Vitals and nursing note reviewed.  Constitutional:      General: She is not in acute distress.    Appearance: She is well-developed.     Comments: tearful  HENT:     Head: Normocephalic and atraumatic.     Mouth/Throat:     Pharynx: No oropharyngeal exudate.  Eyes:     Conjunctiva/sclera: Conjunctivae normal.     Pupils: Pupils are equal, round, and reactive to light.  Neck:  Comments: No meningismus. Cardiovascular:     Rate and Rhythm: Normal rate and regular rhythm.     Heart sounds: Normal heart sounds. No murmur heard. Pulmonary:     Effort: Pulmonary effort is normal. No respiratory distress.     Breath sounds: Normal breath sounds.  Abdominal:     Palpations: Abdomen is soft.     Tenderness: There is no abdominal tenderness. There is no guarding or rebound.  Musculoskeletal:        General: No tenderness. Normal range of motion.     Cervical back: Normal range of motion and neck supple.     Comments: Full range of motion of bilateral legs. Intact DP and PT pulses. No warmth or erythema to major joints.  Skin:    General: Skin is warm.  Neurological:     Mental Status: She is alert and  oriented to person, place, and time.     Cranial Nerves: No cranial nerve deficit.     Motor: No abnormal muscle tone.     Coordination: Coordination normal.     Comments:  5/5 strength throughout. CN 2-12 intact.Equal grip strength.   Psychiatric:        Behavior: Behavior normal.     ED Results / Procedures / Treatments   Labs (all labs ordered are listed, but only abnormal results are displayed) Labs Reviewed  CBC WITH DIFFERENTIAL/PLATELET - Abnormal; Notable for the following components:      Result Value   RBC 5.44 (*)    MCV 70.4 (*)    MCH 23.7 (*)    nRBC 0.3 (*)    All other components within normal limits  RETICULOCYTES - Abnormal; Notable for the following components:   RBC. 5.40 (*)    Immature Retic Fract 19.9 (*)    All other components within normal limits  COMPREHENSIVE METABOLIC PANEL - Abnormal; Notable for the following components:   CO2 21 (*)    Glucose, Bld 101 (*)    AST 11 (*)    All other components within normal limits  URINALYSIS, ROUTINE W REFLEX MICROSCOPIC  I-STAT BETA HCG BLOOD, ED ([facility], [facility], [facility] ONLY)  TROPONIN I (HIGH SENSITIVITY)  TROPONIN I (HIGH SENSITIVITY)    EKG EKG Interpretation  Date/Time:  Monday February 17 2022 02:51:34 EST Ventricular Rate:  82 PR Interval:  186 QRS Duration: 102 QT Interval:  387 QTC Calculation: 452 R Axis:   101 Text Interpretation: Sinus rhythm Borderline right axis deviation Borderline T abnormalities, diffuse leads No significant change was found Confirmed by Glynn Octave [phone]) on 02/17/2022 3:18:31 AM  Radiology No results found.  Procedures Procedures    Medications Ordered in ED Medications  dextrose 5 %-0.45 % sodium chloride infusion (has no administration in time range)  ketorolac (TORADOL) 15 MG/ML injection 15 mg (has no administration in time range)  HYDROmorphone (DILAUDID) injection 2 mg (has no administration in time range)  HYDROmorphone (DILAUDID) injection 2 mg (has no  administration in time range)  HYDROmorphone (DILAUDID) injection 2 mg (has no administration in time range)  diphenhydrAMINE (BENADRYL) capsule 25-50 mg (has no administration in time range)  oxyCODONE-acetaminophen (PERCOCET/ROXICET) 5-325 MG per tablet 2 tablet (2 tablets Oral Given 02/17/22 0032)    ED Course/ Medical Decision Making/ A&P                             Medical Decision Making Amount and/or Complexity of Data Reviewed  Labs: ordered. Decision-making details documented in ED Course. Radiology: ordered and independent interpretation performed. Decision-making details documented in ED Course. ECG/medicine tests: ordered and independent interpretation performed. Decision-making details documented in ED Course.  Risk Prescription drug management.   Typical sickle cell pain involving bilateral legs.  Neurovascular intact.  No fever.  Low suspicion for cord compression or cauda equina.  Patient given pain medication per sickle cell protocol.  She is afebrile nontoxic-appearing.  No hypoxia or increased work of breathing.  No chest pain or shortness of breath.  Labs are reassuring with stable hemoglobin and adequate reticulocyte count.  After multiple doses of pain medication, patient states her pain is still uncontrolled and she is requesting admission to the hospital.  She is given a dose of her p.o. medication as well.  She states she is not able to get her medications filled for another 2 days.  Low suspicion for acute chest syndrome. No hypoxia, chest pain or shortness of breath. Admission discussed with Dr. Ara Kussmaul.        Final Clinical Impression(s) / ED Diagnoses Final diagnoses:  None    Rx / DC Orders ED Discharge Orders     None         Cesario Weidinger, Annie [place], MD 02/17/22 [phone]

## 2022-02-18 ENCOUNTER — Ambulatory Visit: Payer: Self-pay | Admitting: Nurse Practitioner

## 2022-02-18 DIAGNOSIS — D5700 Hb-SS disease with crisis, unspecified: Secondary | ICD-10-CM | POA: Diagnosis not present

## 2022-02-18 LAB — CBC WITH DIFFERENTIAL/PLATELET
Abs Immature Granulocytes: 0.03 10*3/uL (ref 0.00–0.07)
Basophils Absolute: 0.1 10*3/uL (ref 0.0–0.1)
Basophils Relative: 1 %
Eosinophils Absolute: 0.4 10*3/uL (ref 0.0–0.5)
Eosinophils Relative: 4 %
HCT: 35.6 % — ABNORMAL LOW (ref 36.0–46.0)
Hemoglobin: 12.2 g/dL (ref 12.0–15.0)
Immature Granulocytes: 0 %
Lymphocytes Relative: 31 %
Lymphs Abs: 3.3 10*3/uL (ref 0.7–4.0)
MCH: 24 pg — ABNORMAL LOW (ref 26.0–34.0)
MCHC: 34.3 g/dL (ref 30.0–36.0)
MCV: 69.9 fL — ABNORMAL LOW (ref 80.0–100.0)
Monocytes Absolute: 1.1 10*3/uL — ABNORMAL HIGH (ref 0.1–1.0)
Monocytes Relative: 11 %
Neutro Abs: 5.5 10*3/uL (ref 1.7–7.7)
Neutrophils Relative %: 53 %
Platelets: 217 10*3/uL (ref 150–400)
RBC: 5.09 MIL/uL (ref 3.87–5.11)
RDW: 14.6 % (ref 11.5–15.5)
WBC: 10.5 10*3/uL (ref 4.0–10.5)
nRBC: 0.6 % — ABNORMAL HIGH (ref 0.0–0.2)

## 2022-02-18 LAB — COMPREHENSIVE METABOLIC PANEL
ALT: 11 U/L (ref 0–44)
AST: 20 U/L (ref 15–41)
Albumin: 3.7 g/dL (ref 3.5–5.0)
Alkaline Phosphatase: 65 U/L (ref 38–126)
Anion gap: 9 (ref 5–15)
BUN: 7 mg/dL (ref 6–20)
CO2: 25 mmol/L (ref 22–32)
Calcium: 8.9 mg/dL (ref 8.9–10.3)
Chloride: 104 mmol/L (ref 98–111)
Creatinine, Ser: 0.73 mg/dL (ref 0.44–1.00)
GFR, Estimated: >60 mL/min (ref >60)
Glucose, Bld: 118 mg/dL — ABNORMAL HIGH (ref 70–99)
Potassium: 4 mmol/L (ref 3.5–5.1)
Sodium: 138 mmol/L (ref 135–145)
Total Bilirubin: 0.9 mg/dL (ref 0.3–1.2)
Total Protein: 7 g/dL (ref 6.5–8.1)

## 2022-02-18 MED ORDER — PROCHLORPERAZINE EDISYLATE 10 MG/2ML IJ SOLN: 5.0000 mg | Freq: Once | INTRAMUSCULAR | Status: DC | PRN

## 2022-02-18 NOTE — Discharge Summary (Signed)
[facility] Discharge Summary   Patient ID: Stephanie Burch MRN: [id number] DOB/AGE: 1980/04/01 42 y.o.  Admit date: 02/14/2022 Discharge date: 02/14/2022  Primary Care Physician:  Patient, No Pcp Per  Admission Diagnoses:  Active Problems:   Sickle cell pain crisis [facility])   Discharge Medications:  Allergies as of 02/14/2022   No Known Allergies      Medication List     TAKE these medications    acetaminophen 500 MG tablet Commonly known as: TYLENOL Take 1,000 mg by mouth every 6 (six) hours as needed for mild pain, moderate pain or headache.   folic acid 1 MG tablet Commonly known as: FOLVITE Take 1 mg by mouth daily.   HYDROmorphone 4 MG tablet Commonly known as: DILAUDID Take 4 mg by mouth every 6 (six) hours.   ibuprofen 200 MG tablet Commonly known as: ADVIL Take 800 mg by mouth 3 (three) times daily as needed for fever, headache or mild pain.   Oxbryta 500 MG Tabs tablet Generic drug: voxelotor Take 1,500 mg by mouth daily in the afternoon.   oxycodone 30 MG immediate release tablet Commonly known as: ROXICODONE Take 30 mg by mouth every 4 (four) hours.         Consults:  None  Significant Diagnostic Studies:  DG Chest 2 View  Result Date: 01/31/2022 CLINICAL DATA:  Sickle cell crisis. EXAM: CHEST - 2 VIEW COMPARISON:  Same day. FINDINGS: The heart size and mediastinal contours are within normal limits. Both lungs are clear. The visualized skeletal structures are unremarkable. IMPRESSION: No active cardiopulmonary disease. Electronically Signed   By: Marijo Conception M.D.   On: 01/31/2022 13:11   DG Chest Portable 1 View  Result Date: 01/31/2022 CLINICAL DATA:  Pain, sickle cell disease EXAM: PORTABLE CHEST 1 VIEW COMPARISON:  Previous studies including chest radiograph done on 12/07/2021 and CT done on 11/21/2021 FINDINGS: Transverse diameter of heart is increased. There are no signs of alveolar pulmonary edema. There is faint  homogeneous opacity overlying the medial left lower lung field which appears to extend inferiorly into the upper abdomen. Diaphragmatic margins are preserved. There are linear densities in right lower lung field. Lateral CP angles are clear. There is no pneumothorax. Patient's chin is partly obscuring the apices. IMPRESSION: Cardiomegaly. Linear densities in right lower lung fields may suggest subsegmental atelectasis. There is homogeneous opacity noted overlying the medial aspect of left lower lung field which may be an artifact due to chest wall attenuation. Less likely possibility would be infiltrate in left lower lobe. Follow-up PA and lateral views of chest should be considered. Electronically Signed   By: Elmer Picker M.D.   On: 01/31/2022 11:55   History of Present Illness: Stephanie Burch is a 39-year-old female with a medical history significant for sickle cell disease type [place], chronic pain syndrome, opiate dependence and tolerance, frequent hospitalizations, and morbid obesity presents with complaints of bilateral lower extremity pain that is consistent with her typical pain crisis.  Patient has been presenting frequently over the past several months for sickle cell related pain.  She states that pain intensity has been increased and not very well-controlled on her home medication regimen.  Patient's medications are managed by her hematology team at [facility].  Patient appears to be on 2 short acting pain medications including oxycodone and hydromorphone.  She last had these medications around 3 AM without very much relief.  She rates her pain at 7/10.  She denies any  fever, chills, chest pain, shortness of breath, urinary symptoms, nausea, vomiting, or diarrhea.   [facility] Course: Patient admitted to sickle cell day infusion clinic for management of pain crisis. Reviewed all laboratory values, largely consistent with patient's baseline. Pain managed with IV Dilaudid  PCA, IV Toradol, Tylenol, and IV fluids.  Pain intensity decreased throughout admission and patient is requesting discharge home.  She is alert, oriented, and ambulating without assistance.  Patient will discharge home in a hemodynamically stable condition.  Discharge Instructions:  Resume all home medications.   Follow up with PCP as previously  scheduled.   Discussed the importance of drinking 64 ounces of water daily, dehydration of red blood cells may lead further sickling.   Avoid all stressors that precipitate sickle cell pain crisis.     The patient was given clear instructions to go to ER or return to medical center if symptoms do not improve, worsen or new problems develop.   Physical Exam at Discharge:  BP 111/85 (BP Location: Left Arm)   Pulse 90   Temp 97.6 F (36.4 C) (Temporal)   Resp 16   LMP 01/30/2022 (Exact Date)   SpO2 94%  Physical Exam Constitutional:      Appearance: She is obese.  Eyes:     Pupils: Pupils are equal, round, and reactive to light.  Cardiovascular:     Rate and Rhythm: Normal rate and regular rhythm.     Pulses: Normal pulses.  Abdominal:     General: Bowel sounds are normal.  Musculoskeletal:        General: Normal range of motion.  Neurological:     General: No focal deficit present.     Mental Status: Mental status is at baseline.  Psychiatric:        Mood and Affect: Mood normal.        Behavior: Behavior normal.        Thought Content: Thought content normal.        Judgment: Judgment normal.       Disposition at Discharge: Discharge disposition: 01-Home or Self Care       Discharge Orders: Discharge Instructions     Discharge patient   Complete by: As directed    Discharge disposition: 01-Home or Self Care   Discharge patient date: 02/14/2022       Condition at Discharge:   Stable  Time spent on Discharge:  Greater than 30 minutes.  Signed: [name]  APRN, MSN, FNP-C [facility] [address] [place], [place] [address] [phone]  02/18/2022, 5:48 PM

## 2022-02-18 NOTE — Discharge Summary (Signed)
Physician Discharge Summary  Stephanie Burch PXT:[id number] DOB: 1980-03-07 DOA: 02/17/2022  PCP: Patient, No Pcp Per  Admit date: 02/17/2022  Discharge date: 02/18/2022  Discharge Diagnoses:  Active Problems:   BMI 50.0-59.9, adult (HCC)   Chronic pain syndrome   Sickle cell pain crisis (HCC)   Pulmonary hypertension (HCC)   Anemia of chronic disease   Discharge Condition: Stable  Disposition:  Pt is discharged home in good condition and is to follow up with Patient, No Pcp Per this week to have labs evaluated. Stephanie Burch is instructed to increase activity slowly and balance with rest for the next few days, and use prescribed medication to complete treatment of pain  Diet: Regular Wt Readings from Last 3 Encounters:  02/18/22 (!) 146.1 kg  02/15/22 (!) 146.1 kg  02/02/22 (!) 146.1 kg    History of present illness:  Stephanie Burch is a 42 year old female with a medical history significant for sickle cell disease [place], frequent hospitalizations, anemia of chronic disease, morbid obesity, opiate dependence and tolerance, who was just discharged 3 days earlier after admission with sickle cell pain crisis presented to the emergency department for the same complaint.  Patient has had numerous admissions lately for sickle cell related pain.  Pain is primarily in her back and legs.  She attempted home medication regimen without any relief.  Patient treated in the ER and despite initial treatment still reporting pain.  Vital signs stable and patient was admitted for sickle cell pain crisis.  Hospital Course:  Sickle cell disease with pain crisis: Patient was admitted for sickle cell pain crisis and managed appropriately with IVF, IV Dilaudid via PCA and IV Toradol, as well as other adjunct therapies per sickle cell pain management protocols.  Patient was treated for sickle cell pain crisis and IV Dilaudid PCA was weaned.  Patient transition to her home medications. The patient's opiate  medications are managed by her hematology team at [name].  Currently, patient is on 2 short acting medications that include oxycodone and hydromorphone.  I recommend discontinuation of hydromorphone.  Patient may benefit from the addition of a long-acting pain medication. Patient attributes frequency of hospitalizations to discontinuing Adakveo infusions.  Patient has not had a decreased in hemoglobin since discontinuing Adakveo.  Her baseline hemoglobin is 12-13 g/dL.  Patient has not had any significant changes in her laboratory values over the past several months. Patient typically reports increased pain to bilateral hips and lower extremities.  Patient's BMI is 53.  Recommend that patient follows up with her PCP for diabetes screening, cholesterol test, and weight management.  She will establish care with a PCP later this afternoon.  Patient was therefore discharged home today in a hemodynamically stable condition.   Stephanie Burch will follow-up with PCP within 1 week of this discharge. Stephanie Burch was counseled extensively about nonpharmacologic means of pain management, patient verbalized understanding and was appreciative of  the care received during this admission.   We discussed the need for good hydration, monitoring of hydration status, avoidance of heat, cold, stress, and infection triggers. We discussed the need to be adherent with taking home medications. Patient was reminded of the need to seek medical attention immediately if any symptom of bleeding, anemia, or infection occurs.  Discharge Exam: Vitals:   02/18/22 1053 02/18/22 1217  BP: 137/76   Pulse: (!) 106   Resp: 17 16  Temp: 98.1 F (36.7 C)   SpO2: 94% 93%   Vitals:  02/18/22 0620 02/18/22 1053 02/18/22 1127 02/18/22 1217  BP:  137/76    Pulse:  (!) 106    Resp: 14 17  16   Temp:  98.1 F (36.7 C)    TempSrc:  Oral    SpO2: 94% 94%  93%  Weight:   (!) 146.1 kg   Height:   5\' 5"  (1.651 m)     General  appearance : Awake, alert, not in any distress. Speech Clear. Not toxic looking HEENT: Atraumatic and Normocephalic, pupils equally reactive to light and accomodation Neck: Supple, no JVD. No cervical lymphadenopathy.  Chest: Good air entry bilaterally, no added sounds  CVS: S1 S2 regular, no murmurs.  Abdomen: Bowel sounds present, Non tender and not distended with no gaurding, rigidity or rebound. Extremities: B/L Lower Ext shows no edema, both legs are warm to touch Neurology: Awake alert, and oriented X 3, CN II-XII intact, Non focal Skin: No Rash  Discharge Instructions  Discharge Instructions     Discharge patient   Complete by: As directed    Discharge disposition: 01-Home or Self Care   Discharge patient date: 02/18/2022      Allergies as of 02/18/2022   No Known Allergies      Medication List     TAKE these medications    acetaminophen 500 MG tablet Commonly known as: TYLENOL Take 1,000 mg by mouth every 6 (six) hours as needed for mild pain, moderate pain or headache.   folic acid 1 MG tablet Commonly known as: FOLVITE Take 1 mg by mouth daily.   HYDROmorphone 4 MG tablet Commonly known as: DILAUDID Take 4 mg by mouth every 6 (six) hours.   ibuprofen 200 MG tablet Commonly known as: ADVIL Take 800 mg by mouth 3 (three) times daily as needed for fever, headache or mild pain.   Oxbryta 500 MG Tabs tablet Generic drug: voxelotor Take 1,500 mg by mouth daily in the afternoon.   oxycodone 30 MG immediate release tablet Commonly known as: ROXICODONE Take 30 mg by mouth every 4 (four) hours.        The results of significant diagnostics from this hospitalization (including imaging, microbiology, ancillary and laboratory) are listed below for reference.    Significant Diagnostic Studies: DG Chest 2 View  Result Date: 01/31/2022 CLINICAL DATA:  Sickle cell crisis. EXAM: CHEST - 2 VIEW COMPARISON:  Same day. FINDINGS: The heart size and mediastinal  contours are within normal limits. Both lungs are clear. The visualized skeletal structures are unremarkable. IMPRESSION: No active cardiopulmonary disease. Electronically Signed   By: Marijo Conception M.D.   On: 01/31/2022 13:11   DG Chest Portable 1 View  Result Date: 01/31/2022 CLINICAL DATA:  Pain, sickle cell disease EXAM: PORTABLE CHEST 1 VIEW COMPARISON:  Previous studies including chest radiograph done on 12/07/2021 and CT done on 11/21/2021 FINDINGS: Transverse diameter of heart is increased. There are no signs of alveolar pulmonary edema. There is faint homogeneous opacity overlying the medial left lower lung field which appears to extend inferiorly into the upper abdomen. Diaphragmatic margins are preserved. There are linear densities in right lower lung field. Lateral CP angles are clear. There is no pneumothorax. Patient's chin is partly obscuring the apices. IMPRESSION: Cardiomegaly. Linear densities in right lower lung fields may suggest subsegmental atelectasis. There is homogeneous opacity noted overlying the medial aspect of left lower lung field which may be an artifact due to chest wall attenuation. Less likely possibility would be infiltrate in left lower  lobe. Follow-up PA and lateral views of chest should be considered. Electronically Signed   By: Elmer Picker M.D.   On: 01/31/2022 11:55    Microbiology: No results found for this or any previous visit (from the past 240 hour(s)).   Labs: Basic Metabolic Panel: Recent Labs  Lab 02/14/22 1035 02/15/22 1615 02/17/22 0148 02/18/22 0559  NA 140 140 140 138  K 4.9 3.7 3.5 4.0  CL 111 106 109 104  CO2 22 24 21* 25  GLUCOSE 101* 101* 101* 118*  BUN 8 7 6 7   CREATININE 0.60 0.62 0.68 0.73  CALCIUM 9.3 9.9 9.1 8.9   Liver Function Tests: Recent Labs  Lab 02/14/22 1035 02/15/22 1615 02/17/22 0148 02/18/22 0559  AST 17 9* 11* 20  ALT 12 8 11 11   ALKPHOS 67 74 60 65  BILITOT 0.6 0.9 0.7 0.9  PROT 7.7 7.8 7.1 7.0   ALBUMIN 3.8 4.4 3.8 3.7   No results for input(s): "LIPASE", "AMYLASE" in the last 168 hours. No results for input(s): "AMMONIA" in the last 168 hours. CBC: Recent Labs  Lab 02/14/22 1035 02/15/22 1615 02/17/22 0148 02/18/22 0559  WBC 7.2 7.4 10.5 10.5  NEUTROABS 4.6 4.9 6.2 5.5  HGB 13.1 13.1 12.9 12.2  HCT 38.6 38.5 38.3 35.6*  MCV 69.3* 67.8* 70.4* 69.9*  PLT 275 273 258 217   Cardiac Enzymes: No results for input(s): "CKTOTAL", "CKMB", "CKMBINDEX", "TROPONINI" in the last 168 hours. BNP: Invalid input(s): "POCBNP" CBG: No results for input(s): "GLUCAP" in the last 168 hours.  Time coordinating discharge: 30 minutes  Signed:  [name]  APRN, MSN, FNP-C Patient [facility] [place], [place] [address] [phone]  [facility] 02/18/2022, 1:01 PM

## 2022-02-19 ENCOUNTER — Ambulatory Visit: Payer: Self-pay | Admitting: Nurse Practitioner

## 2022-02-21 ENCOUNTER — Ambulatory Visit: Payer: Self-pay | Admitting: Nurse Practitioner

## 2022-02-25 ENCOUNTER — Ambulatory Visit: Payer: Self-pay | Admitting: Nurse Practitioner

## 2022-02-26 ENCOUNTER — Telehealth (HOSPITAL_COMMUNITY): Payer: Self-pay

## 2022-02-26 NOTE — Telephone Encounter (Signed)
Pt called the day hospital wanting to come in today for sickle cell pain treatment. Pt reports 7/10 pain to bilateral legs. Pt reports taking Oxycodone 30 mg at 4 AM. Pt denies being to the Emergency Room. Pt screened for COVID and denies symptoms and exposure. Pt denies fever, chest pain, nausea, vomiting, diarrhea, and abdominal pain. [place] Holl[place], [place] notified and she said pt can come to the day hospital today. Pt notified and verbalized understanding. Pt states that she has transportation without driving self.

## 2022-02-26 NOTE — Telephone Encounter (Signed)
Pt called back day hospital after being triaged and approved by provider to come to day hospital today for treatment. Pt stated that she is going to try to manage pain at home today with home medications, and will not come to the clinic today. Pt stated that she may call back tomorrow morning if pain is not relieved. [place] Hollis, [place] notified.

## 2022-02-27 ENCOUNTER — Non-Acute Institutional Stay (HOSPITAL_COMMUNITY)
Admission: AD | Admit: 2022-02-27 | Discharge: 2022-02-27 | Disposition: A | Discharge status: Home / Self Care | Payer: Medicare Other | Source: Ambulatory Visit | Attending: Internal Medicine | Admitting: Internal Medicine

## 2022-02-27 ENCOUNTER — Telehealth (HOSPITAL_COMMUNITY): Payer: Self-pay | Admitting: *Deleted

## 2022-02-27 DIAGNOSIS — D5700 Hb-SS disease with crisis, unspecified: Secondary | ICD-10-CM | POA: Diagnosis present

## 2022-02-27 DIAGNOSIS — F1120 Opioid dependence, uncomplicated: Secondary | ICD-10-CM | POA: Diagnosis not present

## 2022-02-27 DIAGNOSIS — D638 Anemia in other chronic diseases classified elsewhere: Secondary | ICD-10-CM | POA: Insufficient documentation

## 2022-02-27 DIAGNOSIS — Z6841 Body Mass Index (BMI) 40.0 and over, adult: Secondary | ICD-10-CM | POA: Diagnosis not present

## 2022-02-27 DIAGNOSIS — G894 Chronic pain syndrome: Secondary | ICD-10-CM | POA: Diagnosis not present

## 2022-02-27 LAB — CBC WITH DIFFERENTIAL/PLATELET
Abs Immature Granulocytes: 0.04 10*3/uL (ref 0.00–0.07)
Basophils Absolute: 0 10*3/uL (ref 0.0–0.1)
Basophils Relative: 0 %
Eosinophils Absolute: 0.4 10*3/uL (ref 0.0–0.5)
Eosinophils Relative: 5 %
HCT: 32.4 % — ABNORMAL LOW (ref 36.0–46.0)
Hemoglobin: 11 g/dL — ABNORMAL LOW (ref 12.0–15.0)
Immature Granulocytes: 1 %
Lymphocytes Relative: 24 %
Lymphs Abs: 2 10*3/uL (ref 0.7–4.0)
MCH: 24.3 pg — ABNORMAL LOW (ref 26.0–34.0)
MCHC: 34 g/dL (ref 30.0–36.0)
MCV: 71.5 fL — ABNORMAL LOW (ref 80.0–100.0)
Monocytes Absolute: 0.7 10*3/uL (ref 0.1–1.0)
Monocytes Relative: 8 %
Neutro Abs: 5.3 10*3/uL (ref 1.7–7.7)
Neutrophils Relative %: 62 %
Platelets: 275 10*3/uL (ref 150–400)
RBC: 4.53 MIL/uL (ref 3.87–5.11)
RDW: 16 % — ABNORMAL HIGH (ref 11.5–15.5)
WBC: 8.5 10*3/uL (ref 4.0–10.5)
nRBC: 2.1 % — ABNORMAL HIGH (ref 0.0–0.2)

## 2022-02-27 LAB — COMPREHENSIVE METABOLIC PANEL
ALT: 15 U/L (ref 0–44)
AST: 26 U/L (ref 15–41)
Albumin: 3.3 g/dL — ABNORMAL LOW (ref 3.5–5.0)
Alkaline Phosphatase: 58 U/L (ref 38–126)
Anion gap: 7 (ref 5–15)
BUN: 6 mg/dL (ref 6–20)
CO2: 26 mmol/L (ref 22–32)
Calcium: 8.6 mg/dL — ABNORMAL LOW (ref 8.9–10.3)
Chloride: 107 mmol/L (ref 98–111)
Creatinine, Ser: 0.65 mg/dL (ref 0.44–1.00)
GFR, Estimated: >60 mL/min (ref >60)
Glucose, Bld: 109 mg/dL — ABNORMAL HIGH (ref 70–99)
Potassium: 4.1 mmol/L (ref 3.5–5.1)
Sodium: 140 mmol/L (ref 135–145)
Total Bilirubin: 0.9 mg/dL (ref 0.3–1.2)
Total Protein: 6.7 g/dL (ref 6.5–8.1)

## 2022-02-27 LAB — RETICULOCYTES
Immature Retic Fract: 37.1 % — ABNORMAL HIGH (ref 2.3–15.9)
RBC.: 4.55 MIL/uL (ref 3.87–5.11)
Retic Count, Absolute: 300.8 10*3/uL — ABNORMAL HIGH (ref 19.0–186.0)
Retic Ct Pct: 6.6 % — ABNORMAL HIGH (ref 0.4–3.1)

## 2022-02-27 MED ORDER — HYDROMORPHONE 1 MG/ML IV SOLN
INTRAVENOUS | Status: DC
  Administered 2022-02-27: 30 mg via INTRAVENOUS
  Administered 2022-02-27: 14 mg via INTRAVENOUS
  Filled 2022-02-27: qty 30

## 2022-02-27 MED ORDER — NALOXONE HCL 0.4 MG/ML IJ SOLN: 0.4000 mg | INTRAMUSCULAR | Status: DC | PRN

## 2022-02-27 MED ORDER — ACETAMINOPHEN 500 MG PO TABS
1000.0000 mg | ORAL_TABLET | Freq: Once | ORAL | Status: AC
  Administered 2022-02-27: 1000 mg via ORAL
  Filled 2022-02-27: qty 2

## 2022-02-27 MED ORDER — KETOROLAC TROMETHAMINE 30 MG/ML IJ SOLN
15.0000 mg | Freq: Once | INTRAMUSCULAR | Status: AC
  Administered 2022-02-27: 15 mg via INTRAVENOUS
  Filled 2022-02-27: qty 1

## 2022-02-27 MED ORDER — SODIUM CHLORIDE 0.9% FLUSH: 9.0000 mL | INTRAVENOUS | Status: DC | PRN

## 2022-02-27 MED ORDER — ONDANSETRON HCL 4 MG/2ML IJ SOLN: 4.0000 mg | Freq: Four times a day (QID) | INTRAMUSCULAR | Status: DC | PRN

## 2022-02-27 MED ORDER — DIPHENHYDRAMINE HCL 25 MG PO CAPS: 25.0000 mg | ORAL_CAPSULE | ORAL | Status: DC | PRN

## 2022-02-27 MED ORDER — SODIUM CHLORIDE 0.45 % IV SOLN: INTRAVENOUS | Status: DC

## 2022-02-27 NOTE — Telephone Encounter (Signed)
Patient called requesting to come to the day infusion hospital for sickle cell pain. Patient reports bilateral leg pain rated 7/10. Reports taking Oxycodone 30 mg at 3:00 am. COVID-19 screening done and patient denies all symptoms and exposures. Denies fever, chest pain, nausea, vomiting, diarrhea and abdominal pain. Admits to having transportation without driving self. Per patient, her friend will provide transportation for her. [place], [place] notified. Patient can come to the day hospital for pain management. Patient advised and expresses an understanding.

## 2022-02-27 NOTE — H&P (Signed)
[facility] History and Physical   Date: 02/27/2022  Patient name: Stephanie Burch Medical record number: [id number] Date of birth: 02/15/1980 Age: 42 y.o. Gender: female PCP: Patient, No Pcp Per  Attending physician: Tresa Garter, MD  Chief Complaint: Sickle cell pain   History of present illness:  Stephanie Burch is a 42 year old female with a medical history significant for sickle cell disease, chronic pain syndrome, opiate dependence and tolerance, frequent hospitalizations, anemia of chronic disease, and morbid obesity presents with complaints of lower extremity pain that is consistent with her typical pain crisis.  Patient attributes pain crisis to changes in weather.  Patient states that pain intensity has been increasing over the past several days and unrelieved by her home medications.  Patient last had oxycodone this a.m. without very much relief.  She rates her pain as 9/10, constant, and throbbing.  Of note, patient states that she was "doubling up" on her medications yesterday.  Pain medications are managed by her hematology team at [facility].  Patient denies any chest pain, shortness of breath, paresthesias, or dizziness.  No urinary symptoms, nausea, vomiting, or diarrhea.  No sick contacts, recent travel, or known exposure to COVID-19.   Meds: Medications Prior to Admission  Medication Sig Dispense Refill Last Dose   acetaminophen (TYLENOL) 500 MG tablet Take 1,000 mg by mouth every 6 (six) hours as needed for mild pain, moderate pain or headache.      folic acid (FOLVITE) 1 MG tablet Take 1 mg by mouth daily.      HYDROmorphone (DILAUDID) 4 MG tablet Take 4 mg by mouth every 6 (six) hours.      ibuprofen (ADVIL) 200 MG tablet Take 800 mg by mouth 3 (three) times daily as needed for fever, headache or mild pain.      OXBRYTA 500 MG TABS tablet Take 1,500 mg by mouth daily in the afternoon.      oxycodone (ROXICODONE) 30 MG immediate release  tablet Take 30 mg by mouth every 4 (four) hours.       Allergies: Patient has no known allergies. Past Medical History:  Diagnosis Date   Acute kidney injury ([place]) 11/07/2019   Anemia    Chronic pain    COVID-19 virus detected 10/22/2019   Depression    HCAP (healthcare-associated pneumonia) 11/07/2019   History of blood transfusion 2013   x 2   HSV infection    Hypoxia    Pneumonia    x 1   Pulmonary edema 11/11/2019   Sepsis ([place]) 11/07/2019   Sickle cell anemia ([place]) 09/2021   gets them frequently   Tachycardia with heart rate 100-120 beats per minute    Vitamin B12 deficiency 12/2018   Vitamin D deficiency    Past Surgical History:  Procedure Laterality Date   CESAREAN SECTION  2013   x 1   TOOTH EXTRACTION N/A 10/18/2021   Procedure: DENTAL RESTORATION/EXTRACTIONS;  Surgeon: Diona Browner, DMD;  Location: [facility];  Service: Oral Surgery;  Laterality: N/A;   Family History  Problem Relation Age of Onset   Hypertension Mother    Sickle cell trait Mother    Diabetes Father    Sickle cell trait Father    Social History   Socioeconomic History   Marital status: Single    Spouse name: Not on file   Number of children: Not on file   Years of education: Not on file   Highest education level: Not on file  Occupational History   Not on file  Tobacco Use   Smoking status: Never   Smokeless tobacco: Never  Vaping Use   Vaping Use: Never used  Substance and Sexual Activity   Alcohol use: Not Currently   Drug use: Never   Sexual activity: Yes    Birth control/protection: None  Other Topics Concern   Not on file  Social History Narrative   Not on file   Social Determinants of Health   Financial Resource Strain: Not on file  Food Insecurity: No Food Insecurity (02/17/2022)   Hunger Vital Sign    Worried About Running Out of Food in the Last Year: Never true    Ran Out of Food in the Last Year: Never true  Transportation Needs: No Transportation Needs (02/17/2022)    PRAPARE - Hydrologist (Medical): No    Lack of Transportation (Non-Medical): No  Physical Activity: Not on file  Stress: Not on file  Social Connections: Not on file  Intimate Partner Violence: Not At Risk (02/17/2022)   Humiliation, Afraid, Rape, and Kick questionnaire    Fear of Current or Ex-Partner: No    Emotionally Abused: No    Physically Abused: No    Sexually Abused: No   Review of Systems  Constitutional: Negative.   HENT: Negative.    Eyes: Negative.   Respiratory: Negative.    Cardiovascular: Negative.   Gastrointestinal: Negative.   Genitourinary: Negative.   Musculoskeletal:  Positive for back pain and joint pain.  Skin: Negative.   Neurological: Negative.   Psychiatric/Behavioral: Negative.      Physical Exam: Last menstrual period 01/30/2022. Physical Exam Constitutional:      Appearance: She is obese.  Eyes:     Pupils: Pupils are equal, round, and reactive to light.  Cardiovascular:     Rate and Rhythm: Normal rate and regular rhythm.  Pulmonary:     Effort: Pulmonary effort is normal.  Abdominal:     General: Bowel sounds are normal.  Musculoskeletal:        General: Normal range of motion.  Skin:    General: Skin is warm and dry.  Neurological:     General: No focal deficit present.     Mental Status: She is alert.  Psychiatric:        Mood and Affect: Mood normal.        Thought Content: Thought content normal.      Lab results: No results found for this or any previous visit (from the past 24 hour(s)).  Imaging results:  No results found.   Assessment & Plan: Patient admitted to sickle cell day infusion center for management of pain crisis.  Patient is opiate tolerant Initiate IV dilaudid PCA.  IV fluids, 0.45% saline at 100 ml/hr Toradol 15 mg IV times one dose Tylenol 1000 mg by mouth times one dose Review CBC with differential, complete metabolic panel, and reticulocytes as results become  available. Pain intensity will be reevaluated in context of functioning and relationship to baseline as care progresses If pain intensity remains elevated and/or sudden change in hemodynamic stability transition to inpatient services for higher level of care.      [name]  APRN, MSN, FNP-C [facility] [address] [place], [place] [address] [phone]  02/27/2022, 9:20 AM

## 2022-02-27 NOTE — Progress Notes (Signed)
Pt admitted to the day hospital today for sickle cell pain management. On arrival, pt rates 7/10 pain to bilateral legs. Pt received Dilaudid PCA, IV Toradol, and hydrated with IV fluids via PIV. Pt also received 1,000 mg PO Tylenol. At discharge, pt rated pain 5/10. AVS offered, but pt declined. Pt is alert, oriented and ambulatory at discharge.

## 2022-02-27 NOTE — Discharge Summary (Signed)
[facility] Discharge Summary   Patient ID: Stephanie Burch MRN: [id number] DOB/AGE: 08/11/1980 42 y.o.  Admit date: 02/27/2022 Discharge date: 02/27/2022  Primary Care Physician:  Patient, No Pcp Per  Admission Diagnoses:  Principal Problem:   Sickle cell pain crisis [facility])   Discharge Medications:  Allergies as of 02/27/2022   No Known Allergies      Medication List     TAKE these medications    acetaminophen 500 MG tablet Commonly known as: TYLENOL Take 1,000 mg by mouth every 6 (six) hours as needed for mild pain, moderate pain or headache.   folic acid 1 MG tablet Commonly known as: FOLVITE Take 1 mg by mouth daily.   HYDROmorphone 4 MG tablet Commonly known as: DILAUDID Take 4 mg by mouth every 6 (six) hours.   ibuprofen 200 MG tablet Commonly known as: ADVIL Take 800 mg by mouth 3 (three) times daily as needed for fever, headache or mild pain.   Oxbryta 500 MG Tabs tablet Generic drug: voxelotor Take 1,500 mg by mouth daily in the afternoon.   oxycodone 30 MG immediate release tablet Commonly known as: ROXICODONE Take 30 mg by mouth every 4 (four) hours.         Consults:  None  Significant Diagnostic Studies:  DG Chest 2 View  Result Date: 01/31/2022 CLINICAL DATA:  Sickle cell crisis. EXAM: CHEST - 2 VIEW COMPARISON:  Same day. FINDINGS: The heart size and mediastinal contours are within normal limits. Both lungs are clear. The visualized skeletal structures are unremarkable. IMPRESSION: No active cardiopulmonary disease. Electronically Signed   By: Marijo Conception M.D.   On: 01/31/2022 13:11   DG Chest Portable 1 View  Result Date: 01/31/2022 CLINICAL DATA:  Pain, sickle cell disease EXAM: PORTABLE CHEST 1 VIEW COMPARISON:  Previous studies including chest radiograph done on 12/07/2021 and CT done on 11/21/2021 FINDINGS: Transverse diameter of heart is increased. There are no signs of alveolar pulmonary edema. There is faint  homogeneous opacity overlying the medial left lower lung field which appears to extend inferiorly into the upper abdomen. Diaphragmatic margins are preserved. There are linear densities in right lower lung field. Lateral CP angles are clear. There is no pneumothorax. Patient's chin is partly obscuring the apices. IMPRESSION: Cardiomegaly. Linear densities in right lower lung fields may suggest subsegmental atelectasis. There is homogeneous opacity noted overlying the medial aspect of left lower lung field which may be an artifact due to chest wall attenuation. Less likely possibility would be infiltrate in left lower lobe. Follow-up PA and lateral views of chest should be considered. Electronically Signed   By: Elmer Picker M.D.   On: 01/31/2022 11:55    History of present illness:  Stephanie Burch is a 42 year old female with a medical history significant for sickle cell disease, chronic pain syndrome, opiate dependence and tolerance, frequent hospitalizations, anemia of chronic disease, and morbid obesity presents with complaints of lower extremity pain that is consistent with her typical pain crisis.  Patient attributes pain crisis to changes in weather.  Patient states that pain intensity has been increasing over the past several days and unrelieved by her home medications.  Patient last had oxycodone this a.m. without very much relief.  She rates her pain as 9/10, constant, and throbbing.  Of note, patient states that she was "doubling up" on her medications yesterday.  Pain medications are managed by her hematology team at [facility].  Patient denies any chest pain,  shortness of breath, paresthesias, or dizziness.  No urinary symptoms, nausea, vomiting, or diarrhea.  No sick contacts, recent travel, or known exposure to COVID-19.   [facility] Course: [name] was admitted for sickle cell pain crisis.  Reviewed labs, consistent with her baseline.  Pain managed with IV Dilaudid  PCA, IV fluids, IV Toradol, Tylenol.  Patient's pain intensity decreased throughout admission and she is requesting discharge home. Patient is alert, oriented, and ambulating without assistance.  She will discharge home in a hemodynamically stable condition.    Discharge instructions: Resume all home medications.   Follow up with PCP as previously  scheduled.   Discussed the importance of drinking 64 ounces of water daily, dehydration of red blood cells may lead further sickling.   Avoid all stressors that precipitate sickle cell pain crisis.     The patient was given clear instructions to go to ER or return to medical center if symptoms do not improve, worsen or new problems develop.   Physical Exam at Discharge:  BP (!) 153/85 (BP Location: Right Arm)   Pulse 99   Temp 97.8 F (36.6 C) (Temporal)   Resp 17   LMP 01/30/2022 (Exact Date)   SpO2 100%  Physical Exam Constitutional:      Appearance: Normal appearance.  Eyes:     Pupils: Pupils are equal, round, and reactive to light.  Cardiovascular:     Rate and Rhythm: Normal rate and regular rhythm.  Pulmonary:     Effort: Pulmonary effort is normal.  Abdominal:     General: Bowel sounds are normal.  Musculoskeletal:        General: Normal range of motion.  Skin:    General: Skin is warm.  Neurological:     General: No focal deficit present.     Mental Status: She is alert.  Psychiatric:        Mood and Affect: Mood normal.        Behavior: Behavior normal.        Thought Content: Thought content normal.        Judgment: Judgment normal.      Disposition at Discharge: Discharge disposition: 01-Home or Self Care       Discharge Orders:   Condition at Discharge:   Stable  Time spent on Discharge:  Greater than 30 minutes.  Signed: [name] 02/27/2022, 7:00 PM

## 2022-03-01 ENCOUNTER — Encounter (HOSPITAL_COMMUNITY): Payer: Self-pay

## 2022-03-01 ENCOUNTER — Emergency Department (HOSPITAL_BASED_OUTPATIENT_CLINIC_OR_DEPARTMENT_OTHER)
Admission: EM | Admit: 2022-03-01 | Discharge: 2022-03-02 | Disposition: A | Discharge status: Home / Self Care | Payer: Medicare Other | Attending: Emergency Medicine | Admitting: Emergency Medicine

## 2022-03-01 ENCOUNTER — Other Ambulatory Visit: Payer: Self-pay

## 2022-03-01 DIAGNOSIS — D5700 Hb-SS disease with crisis, unspecified: Secondary | ICD-10-CM | POA: Diagnosis not present

## 2022-03-01 LAB — RETICULOCYTES
Immature Retic Fract: 26.7 % — ABNORMAL HIGH (ref 2.3–15.9)
RBC.: 4.95 MIL/uL (ref 3.87–5.11)
Retic Count, Absolute: 235.1 10*3/uL — ABNORMAL HIGH (ref 19.0–186.0)
Retic Ct Pct: 4.8 % — ABNORMAL HIGH (ref 0.4–3.1)

## 2022-03-01 LAB — CBC WITH DIFFERENTIAL/PLATELET
Abs Immature Granulocytes: 0.05 10*3/uL (ref 0.00–0.07)
Basophils Absolute: 0 10*3/uL (ref 0.0–0.1)
Basophils Relative: 1 %
Eosinophils Absolute: 0.2 10*3/uL (ref 0.0–0.5)
Eosinophils Relative: 3 %
HCT: 34.9 % — ABNORMAL LOW (ref 36.0–46.0)
Hemoglobin: 12.1 g/dL (ref 12.0–15.0)
Immature Granulocytes: 1 %
Lymphocytes Relative: 24 %
Lymphs Abs: 2.1 10*3/uL (ref 0.7–4.0)
MCH: 24.3 pg — ABNORMAL LOW (ref 26.0–34.0)
MCHC: 34.7 g/dL (ref 30.0–36.0)
MCV: 70.2 fL — ABNORMAL LOW (ref 80.0–100.0)
Monocytes Absolute: 0.7 10*3/uL (ref 0.1–1.0)
Monocytes Relative: 8 %
Neutro Abs: 5.6 10*3/uL (ref 1.7–7.7)
Neutrophils Relative %: 63 %
Platelets: 278 10*3/uL (ref 150–400)
RBC: 4.97 MIL/uL (ref 3.87–5.11)
RDW: 16.3 % — ABNORMAL HIGH (ref 11.5–15.5)
WBC: 8.7 10*3/uL (ref 4.0–10.5)
nRBC: 0.9 % — ABNORMAL HIGH (ref 0.0–0.2)

## 2022-03-01 LAB — COMPREHENSIVE METABOLIC PANEL
ALT: 8 U/L (ref 0–44)
AST: 9 U/L — ABNORMAL LOW (ref 15–41)
Albumin: 3.9 g/dL (ref 3.5–5.0)
Alkaline Phosphatase: 63 U/L (ref 38–126)
Anion gap: 7 (ref 5–15)
BUN: 6 mg/dL (ref 6–20)
CO2: 27 mmol/L (ref 22–32)
Calcium: 9.9 mg/dL (ref 8.9–10.3)
Chloride: 108 mmol/L (ref 98–111)
Creatinine, Ser: 0.72 mg/dL (ref 0.44–1.00)
GFR, Estimated: >60 mL/min (ref >60)
Glucose, Bld: 102 mg/dL — ABNORMAL HIGH (ref 70–99)
Potassium: 4.4 mmol/L (ref 3.5–5.1)
Sodium: 142 mmol/L (ref 135–145)
Total Bilirubin: 0.7 mg/dL (ref 0.3–1.2)
Total Protein: 7.4 g/dL (ref 6.5–8.1)

## 2022-03-01 MED ORDER — OXYCODONE HCL 5 MG PO TABS: 10.0000 mg | ORAL_TABLET | Freq: Once | ORAL | Status: DC

## 2022-03-01 MED ORDER — KETOROLAC TROMETHAMINE 15 MG/ML IJ SOLN
15.0000 mg | Freq: Once | INTRAMUSCULAR | Status: AC
  Administered 2022-03-01: 15 mg via INTRAVENOUS
  Filled 2022-03-01: qty 1

## 2022-03-01 MED ORDER — HYDROMORPHONE HCL 1 MG/ML IJ SOLN
2.0000 mg | Freq: Once | INTRAMUSCULAR | Status: AC
  Administered 2022-03-01: 2 mg via INTRAVENOUS
  Filled 2022-03-01: qty 2

## 2022-03-01 MED ORDER — HYDROMORPHONE HCL 1 MG/ML IJ SOLN
1.0000 mg | INTRAMUSCULAR | Status: AC
  Administered 2022-03-01: 1 mg via INTRAVENOUS
  Filled 2022-03-01: qty 1

## 2022-03-01 MED ORDER — HYDROMORPHONE HCL 1 MG/ML IJ SOLN: 2.0000 mg | Freq: Once | INTRAMUSCULAR | Status: DC

## 2022-03-01 NOTE — Progress Notes (Signed)
Hospitalist Transfer Note:  Transferring facility: [facility] Requesting provider: Sherrell Puller, PA (EDP at [facility]) Reason for transfer: admission for further evaluation and management of acute sickle cell pain crisis.    42 year old female with medical history notable for sickle cell anemia with multiple prior acute sickle cell pain crises, who presented to [facility] ED complaining of symmetrical bilateral lower extremity discomfort over the last few days, with reported pain distribution, quality, intensity very similar to that experience at times of previous sickle cell pain crises.  Not associate with any chest pain or shortness of breath gnosis with a reported fever.  It appears that she had presented with similar complaints on 02/27/2022 at which time she was admitted with same-day discharge to the sickle cell team at [facility] long.  In the interval since discharge, she has noted progression of the above discomfort, prompting her to present to [facility] ED this evening.  No report of overt underlying infectious process.  After 4 doses of IV Dilaudid 1 dose of IV Toradol administered in [place] emergency department this evening, the patient continues to report suboptimal pain control.    Subsequently, I accepted this patient for transfer for observation/inpatient admission to a med/tele bed at [facility] for further work-up and management of acute sickle cell pain crisis.       Check [email or web address] for on-call coverage.   Nursing staff, Please call [place] number on Amion as soon as patient's arrival, so appropriate admitting provider can evaluate the pt.     Babs Bertin, DO Hospitalist

## 2022-03-01 NOTE — ED Triage Notes (Signed)
Patient arrives with complaints of sickle cell crisis x2 days. Patient rates generalized pain a 10/10 with no relief from home po meds.

## 2022-03-01 NOTE — ED Provider Notes (Signed)
[place] EMERGENCY DEPARTMENT AT [facility] Provider Note   CSN: [id number] Arrival date & time: 03/01/22  1155     History Chief Complaint  Patient presents with   Sickle Cell Pain Crisis    Stephanie Burch is a 42 y.o. female with history of sickle cell, continuous use of opioids, chronic pain syndrome, presents to the ER for evaluation of sickle cell pain crisis for the past 2 days.  She denies any chest pain or any shortness of breath or fever.  She reports that she has had continuous pain in her bilateral lower legs.  She reports that this is a typical pain for her.  She has tried her at home medications without much relief.  Denies any swelling.  She was recently admitted to the sickle cell clinic on 02-27-2022.   Sickle Cell Pain Crisis Associated symptoms: no chest pain, no fever, no nausea, no shortness of breath and no vomiting        Home Medications Prior to Admission medications   Medication Sig Start Date End Date Taking? Authorizing Provider  acetaminophen (TYLENOL) 500 MG tablet Take 1,000 mg by mouth every 6 (six) hours as needed for mild pain, moderate pain or headache.    [provider]  folic acid (FOLVITE) 1 MG tablet Take 1 mg by mouth daily.    [provider]  HYDROmorphone (DILAUDID) 4 MG tablet Take 4 mg by mouth every 6 (six) hours.    [provider]  ibuprofen (ADVIL) 200 MG tablet Take 800 mg by mouth 3 (three) times daily as needed for fever, headache or mild pain.    [provider]  OXBRYTA 500 MG TABS tablet Take 1,500 mg by mouth daily in the afternoon. 10/04/21   [provider]  oxycodone (ROXICODONE) 30 MG immediate release tablet Take 30 mg by mouth every 4 (four) hours.    [provider]      Allergies    Patient has no known allergies.    Review of Systems   Review of Systems  Constitutional:  Negative for chills and fever.  Respiratory:  Negative for shortness of breath.    Cardiovascular:  Negative for chest pain.  Gastrointestinal:  Negative for abdominal pain, nausea and vomiting.  Musculoskeletal:  Positive for arthralgias and myalgias. Negative for back pain.    Physical Exam Updated Vital Signs BP (!) 132/117   Pulse 82   Temp 98.5 F (36.9 C) (Oral)   Resp 18   Ht 5\' 5"  (1.651 m)   Wt (!) 146.1 kg   LMP 01/30/2022 (Exact Date)   SpO2 97%   BMI 53.60 kg/m  Physical Exam Constitutional:      Appearance: Normal appearance.     Comments: Uncomfortable, but nontoxic-appearing.  Eyes:     General: No scleral icterus. Cardiovascular:     Rate and Rhythm: Normal rate.  Pulmonary:     Effort: Pulmonary effort is normal. No respiratory distress.     Breath sounds: Normal breath sounds.  Musculoskeletal:     Comments: Diffuse tenderness to bilateral lower extremities.  Compartments are soft.  Sensation intact.  She has palpable DP and PT pulses.  Coloration and temperature appear and feel symmetric.  She can move her feet, ankles, knees with pain.  No overlying erythema or warmth to the joints.  Skin:    General: Skin is dry.     Findings: No rash.  Neurological:     General: No focal  deficit present.     Mental Status: She is alert. Mental status is at baseline.  Psychiatric:        Mood and Affect: Mood normal.     ED Results / Procedures / Treatments   Labs (all labs ordered are listed, but only abnormal results are displayed) Labs Reviewed  COMPREHENSIVE METABOLIC PANEL - Abnormal; Notable for the following components:      Result Value   Glucose, Bld 102 (*)    AST 9 (*)    All other components within normal limits  CBC WITH DIFFERENTIAL/PLATELET - Abnormal; Notable for the following components:   HCT 34.9 (*)    MCV 70.2 (*)    MCH 24.3 (*)    RDW 16.3 (*)    nRBC 0.9 (*)    All other components within normal limits  RETICULOCYTES - Abnormal; Notable for the following components:   Retic Ct Pct 4.8 (*)    Retic Count,  Absolute 235.1 (*)    Immature Retic Fract 26.7 (*)    All other components within normal limits  PREGNANCY, URINE    EKG None  Radiology No results found.  Procedures .Critical Care  Performed by: Sherrell Puller, PA-C Authorized by: Sherrell Puller, PA-C   Critical care provider statement:    Critical care time (minutes):  30   Critical care was necessary to treat or prevent imminent or life-threatening deterioration of the following conditions: pain crisis.   Critical care was time spent personally by me on the following activities:  Development of treatment plan with patient or surrogate, discussions with consultants, evaluation of patient's response to treatment, examination of patient, ordering and review of laboratory studies, ordering and review of radiographic studies, ordering and performing treatments and interventions, pulse oximetry, re-evaluation of patient's condition, review of old charts and obtaining history from patient or surrogate   I assumed direction of critical care for this patient from another provider in my specialty: no      Medications Ordered in ED Medications  HYDROmorphone (DILAUDID) injection 1 mg (1 mg Intravenous Given 03/01/22 1329)  HYDROmorphone (DILAUDID) injection 1 mg (1 mg Intravenous Given 03/01/22 1333)  HYDROmorphone (DILAUDID) injection 2 mg (2 mg Intravenous Given 03/01/22 1404)  ketorolac (TORADOL) 15 MG/ML injection 15 mg (15 mg Intravenous Given 03/01/22 1402)  HYDROmorphone (DILAUDID) injection 2 mg (2 mg Intravenous Given 03/01/22 1706)    ED Course/ Medical Decision Making/ A&P                           Medical Decision Making Amount and/or Complexity of Data Reviewed Labs: ordered.  Risk Prescription drug management. Decision regarding hospitalization.   42 year old female presents the emergency room today for evaluation of sickle cell pain crisis.  Differential diagnosis includes is not 1-2 sickle cell pain crisis, pain with  sickle cell, reticulocyte crisis, DVTs, compartment syndrome, rhabdo.  Vital signs show slightly elevated blood pressure 127 134, afebrile, normal pulse rate, satting well on room air without increased work of breathing.  Physical exam as noted above.  I independently reviewed and interpreted the patient's labs.  CMP shows mildly elevated glucose at 102 and mildly decreased AST at 9 otherwise no electrolyte or LFT abnormality.  Patient's hemoglobin around baseline.  Normal platelets.  No leukocytosis.  Reticulocyte count appears to be around her baseline as well.  Patient reports this feels like her typical pain crisis.  Her compartments are soft and she has  good sensation, temperature, and pulses to the legs.  I do not think any additional imaging is needed at this time.  Will continue with pain management. She doesn't have any chest pain or SOB. Her vitals are stable. I doubt any acute chest.    Patient received multiple rounds of Dilaudid and Toradol, and is still tearful on my examination. I think this patient could benefit from an admission for further pain management. Admit to triad hospitalist.   Final Clinical Impression(s) / ED Diagnoses Final diagnoses:  None    Rx / DC Orders ED Discharge Orders     None         Sherrell Puller, Hershal Coria 03/01/22 1938    Ezequiel Essex, MD 03/02/22 [phone]

## 2022-03-02 DIAGNOSIS — D5700 Hb-SS disease with crisis, unspecified: Secondary | ICD-10-CM | POA: Diagnosis not present

## 2022-03-02 IMAGING — DX DG CHEST 2V
2 series · 2 of 2 positions shown · non-contrast
Comparison: November 10, 2018

CLINICAL DATA: Chest pain

EXAM:
CHEST - 2 VIEW

[chest pa]
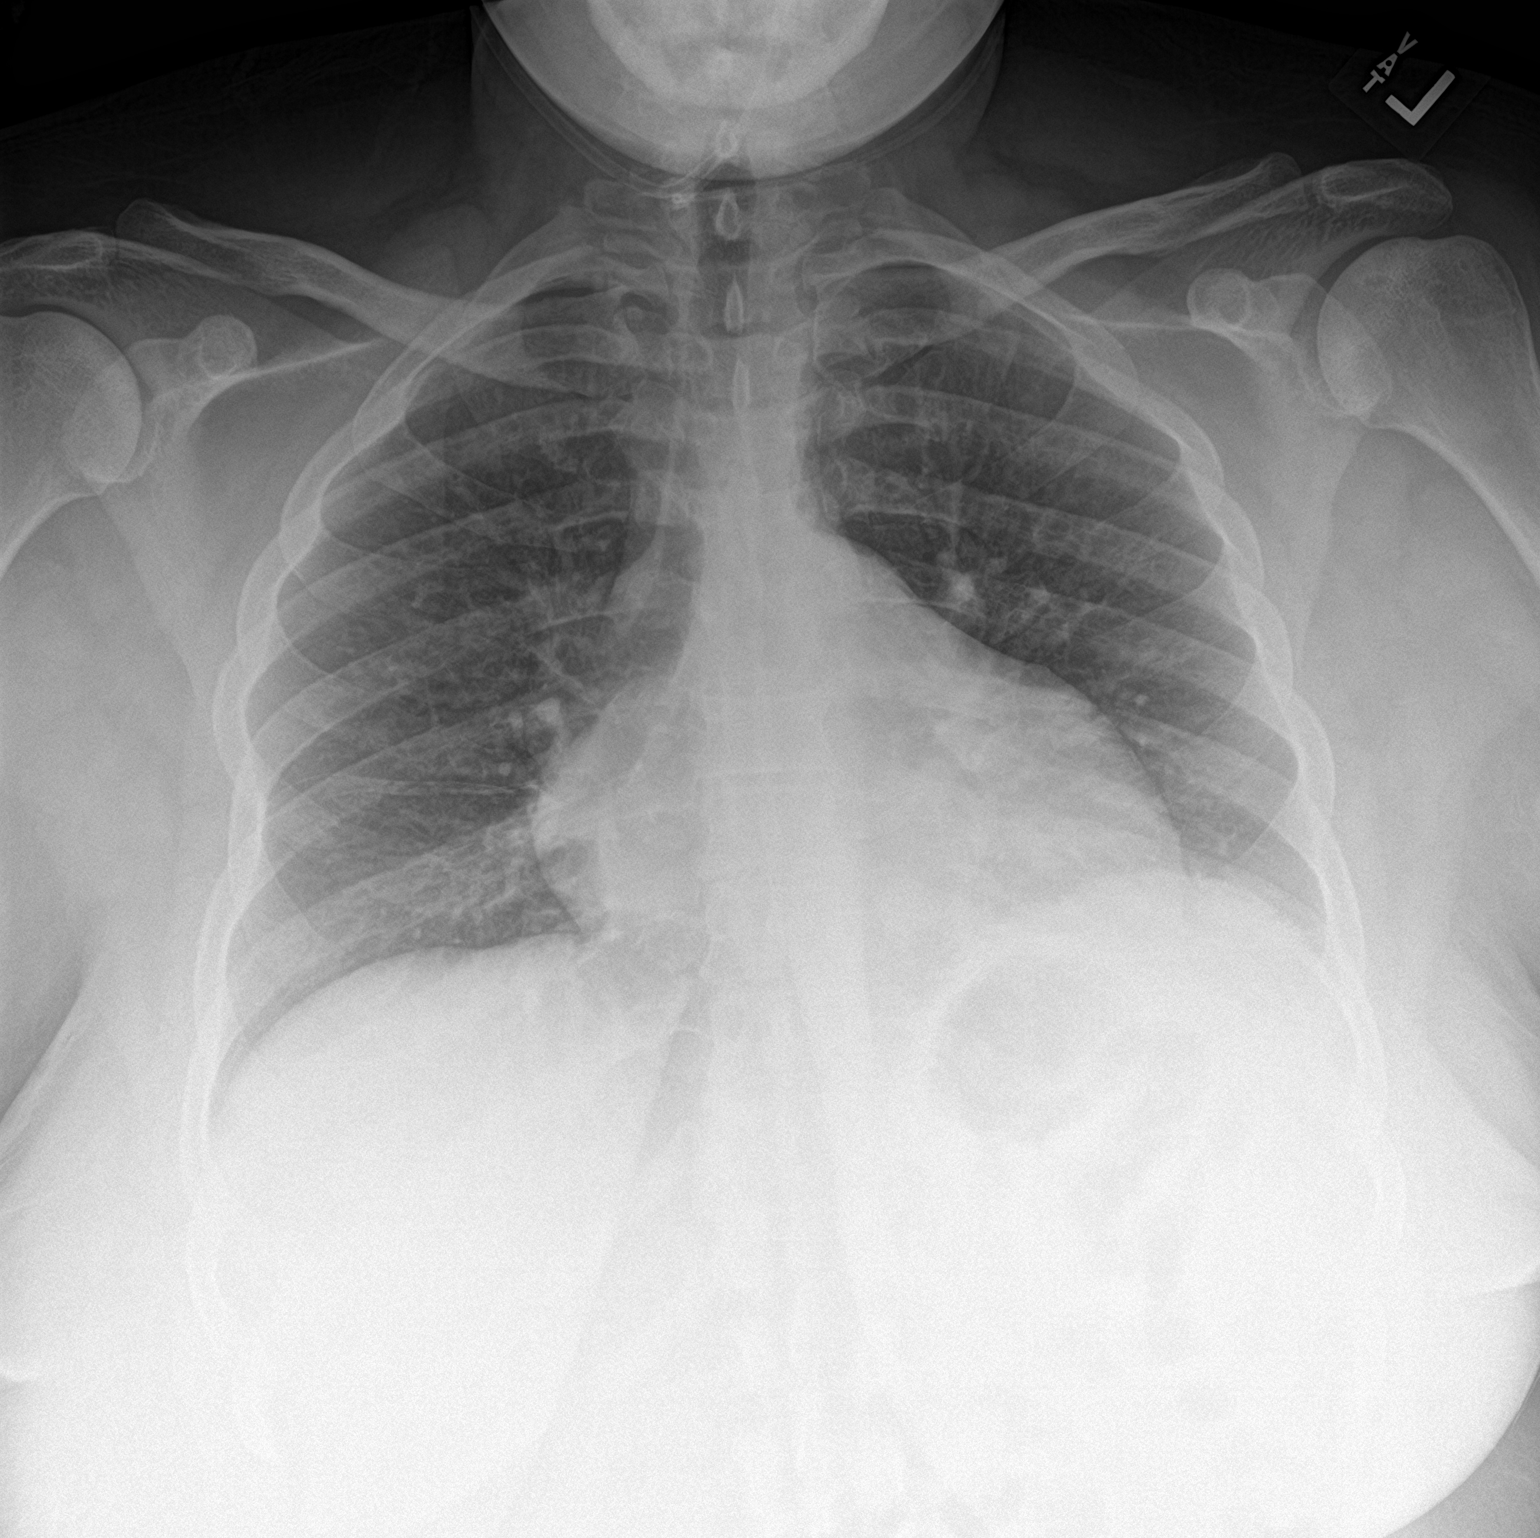

[chest lat]
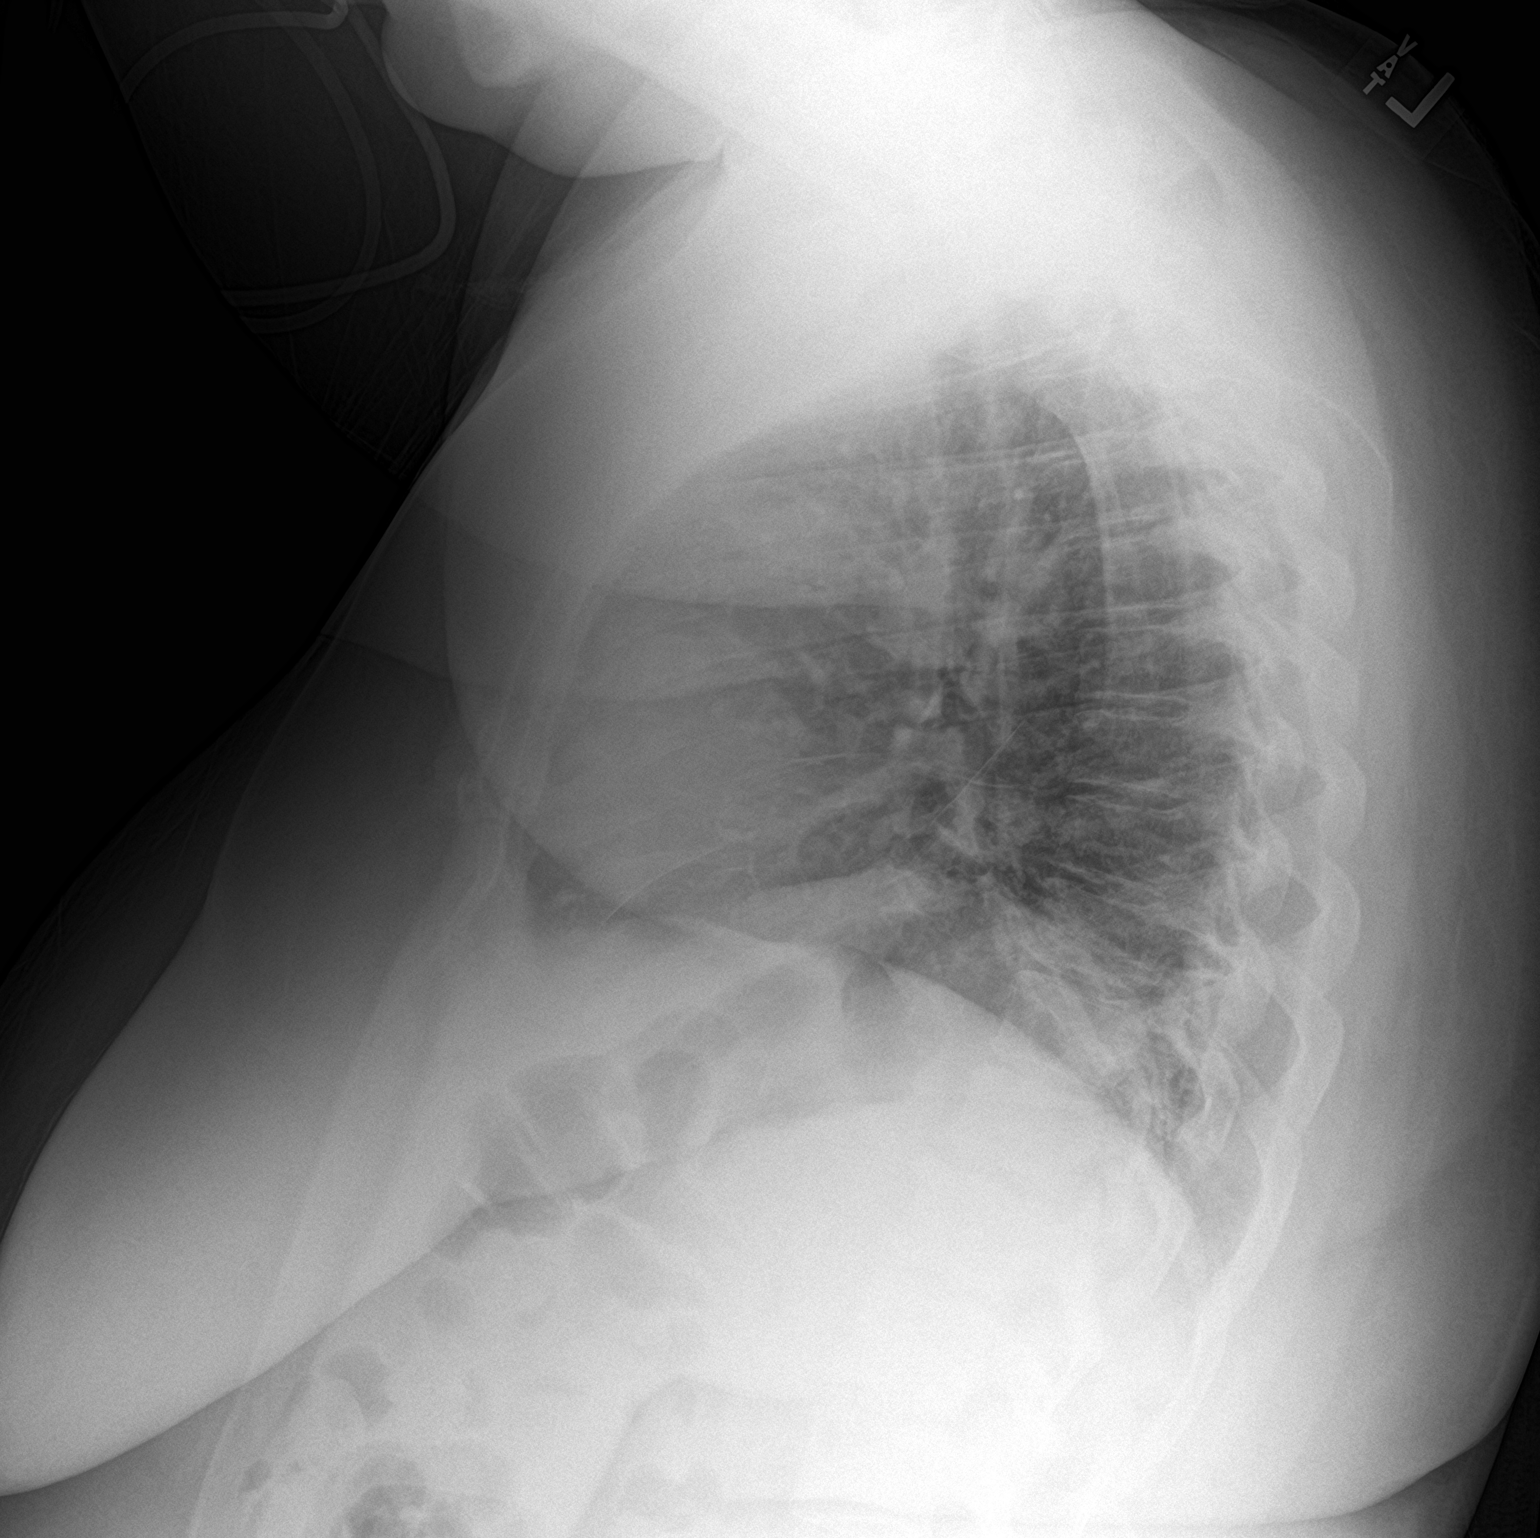

[2 of 2 positions shown; findings below may reference images not displayed]

FINDINGS: Lungs are clear. Heart is upper normal in size with pulmonary
vascularity normal. No adenopathy. No pneumothorax. No bone lesions.
IMPRESSION: Lungs clear.  Heart upper normal in size.  No evident adenopathy.

## 2022-03-02 MED ORDER — HYDROMORPHONE HCL 1 MG/ML IJ SOLN
2.0000 mg | Freq: Once | INTRAMUSCULAR | Status: AC
  Administered 2022-03-02: 2 mg via INTRAVENOUS
  Filled 2022-03-02: qty 2

## 2022-03-02 MED ORDER — OXYCODONE HCL 5 MG PO TABS
10.0000 mg | ORAL_TABLET | Freq: Once | ORAL | Status: AC
  Administered 2022-03-02: 10 mg via ORAL
  Filled 2022-03-02: qty 2

## 2022-03-02 NOTE — ED Notes (Signed)
Pt given discharge instructions. Opportunities given for questions. Pt verbalizes understanding. Madden Garron R, RN 

## 2022-03-02 NOTE — Discharge Instructions (Signed)
Please take your medications as prescribed.  Please follow-up with your hematologist in the office.  You call the sickle cell clinic in the morning and discuss your visit and see when they want to see you in the office.

## 2022-03-02 NOTE — ED Provider Notes (Signed)
I assumed care of the patient at 0 700.  Briefly the patient is a 42 year old female here with sickle cell pain crisis.  She is awaiting admission.  I received a phone call from Dr. Jonelle Sidle, he knows the patient well and had a discussion with me about her.  He told me that he was notified about her being in the ED yesterday and had declined to admit her feeling that her symptoms are likely secondary to chronic pain as opposed to an acute sickle cell crisis.  He had reviewed her labs and saw no obvious concerning finding.  I evaluated the patient and found her with no outward signs of pain.  She is resting comfortably.  Required multiple doses of IV narcotics overnight.  I discussed with her about my conversation with Dr. Jonelle Sidle.  She became a bit frustrated and told me that she is already taken all of her home narcotics and would not be able to get them refilled until Tuesday.  I suspect this is likely why she is in the hospital.  Encouraged her to take her home medications as prescribed.  Encouraged her to call the sickle cell clinic in the morning.   Deno Etienne, DO 03/02/22 [phone]

## 2022-03-03 ENCOUNTER — Non-Acute Institutional Stay (HOSPITAL_COMMUNITY)
Admission: AD | Admit: 2022-03-03 | Discharge: 2022-03-03 | Disposition: A | Discharge status: Home / Self Care | Payer: Medicare Other | Source: Ambulatory Visit | Attending: Internal Medicine | Admitting: Internal Medicine

## 2022-03-03 ENCOUNTER — Telehealth (HOSPITAL_COMMUNITY): Payer: Self-pay

## 2022-03-03 DIAGNOSIS — D5700 Hb-SS disease with crisis, unspecified: Secondary | ICD-10-CM

## 2022-03-03 DIAGNOSIS — D638 Anemia in other chronic diseases classified elsewhere: Secondary | ICD-10-CM | POA: Insufficient documentation

## 2022-03-03 DIAGNOSIS — G894 Chronic pain syndrome: Secondary | ICD-10-CM | POA: Insufficient documentation

## 2022-03-03 DIAGNOSIS — Z6841 Body Mass Index (BMI) 40.0 and over, adult: Secondary | ICD-10-CM | POA: Diagnosis not present

## 2022-03-03 DIAGNOSIS — F1120 Opioid dependence, uncomplicated: Secondary | ICD-10-CM | POA: Insufficient documentation

## 2022-03-03 LAB — CBC
HCT: 40.1 % (ref 36.0–46.0)
Hemoglobin: 13.7 g/dL (ref 12.0–15.0)
MCH: 24.1 pg — ABNORMAL LOW (ref 26.0–34.0)
MCHC: 34.2 g/dL (ref 30.0–36.0)
MCV: 70.6 fL — ABNORMAL LOW (ref 80.0–100.0)
Platelets: 273 10*3/uL (ref 150–400)
RBC: 5.68 MIL/uL — ABNORMAL HIGH (ref 3.87–5.11)
RDW: 16 % — ABNORMAL HIGH (ref 11.5–15.5)
WBC: 10.4 10*3/uL (ref 4.0–10.5)
nRBC: 0.4 % — ABNORMAL HIGH (ref 0.0–0.2)

## 2022-03-03 LAB — COMPREHENSIVE METABOLIC PANEL
ALT: 15 U/L (ref 0–44)
AST: 15 U/L (ref 15–41)
Albumin: 4.2 g/dL (ref 3.5–5.0)
Alkaline Phosphatase: 75 U/L (ref 38–126)
Anion gap: 9 (ref 5–15)
BUN: 6 mg/dL (ref 6–20)
CO2: 19 mmol/L — ABNORMAL LOW (ref 22–32)
Calcium: 9.3 mg/dL (ref 8.9–10.3)
Chloride: 110 mmol/L (ref 98–111)
Creatinine, Ser: 0.58 mg/dL (ref 0.44–1.00)
GFR, Estimated: >60 mL/min (ref >60)
Glucose, Bld: 128 mg/dL — ABNORMAL HIGH (ref 70–99)
Potassium: 3.5 mmol/L (ref 3.5–5.1)
Sodium: 138 mmol/L (ref 135–145)
Total Bilirubin: 0.6 mg/dL (ref 0.3–1.2)
Total Protein: 8.2 g/dL — ABNORMAL HIGH (ref 6.5–8.1)

## 2022-03-03 MED ORDER — OXYCODONE HCL ER 10 MG PO T12A
20.0000 mg | EXTENDED_RELEASE_TABLET | Freq: Once | ORAL | Status: AC
  Administered 2022-03-03: 20 mg via ORAL
  Filled 2022-03-03: qty 2

## 2022-03-03 MED ORDER — HYDROMORPHONE HCL 4 MG PO TABS
4.0000 mg | ORAL_TABLET | Freq: Once | ORAL | Status: AC
  Administered 2022-03-03: 4 mg via ORAL
  Filled 2022-03-03: qty 1

## 2022-03-03 MED ORDER — HYDROMORPHONE HCL 2 MG/ML IJ SOLN
2.0000 mg | Freq: Once | INTRAMUSCULAR | Status: AC
  Administered 2022-03-03: 2 mg via SUBCUTANEOUS
  Filled 2022-03-03: qty 1

## 2022-03-03 MED ORDER — SODIUM CHLORIDE 0.45 % IV SOLN: INTRAVENOUS | Status: DC

## 2022-03-03 MED ORDER — ONDANSETRON HCL 4 MG/2ML IJ SOLN
4.0000 mg | Freq: Once | INTRAMUSCULAR | Status: AC
  Administered 2022-03-03: 4 mg via INTRAVENOUS
  Filled 2022-03-03: qty 2

## 2022-03-03 MED ORDER — KETOROLAC TROMETHAMINE 30 MG/ML IJ SOLN
15.0000 mg | Freq: Once | INTRAMUSCULAR | Status: AC
  Administered 2022-03-03: 15 mg via INTRAVENOUS
  Filled 2022-03-03: qty 1

## 2022-03-03 NOTE — H&P (Signed)
[facility] History and Physical   Date: 03/03/2022  Patient name: Stephanie Burch Medical record number: [id number] Date of birth: [birth year] Age: 42 y.o. Gender: female PCP: Patient, No Pcp Per  Attending physician: Tresa Garter, MD  Chief Complaint: Burch cell pain   History of Present Illness: Stephanie Burch is a 42 year old female   The patient was advised to contact clinic for admission. The patient is out of her home medications. She has been taking more than prescribed. On 02/18/2022, patient fillied Oxycodone 30 mg # 90 and Hydromorphone 4 mg #60, which is a total of 150 pills.   Meds: Medications Prior to Admission  Medication Sig Dispense Refill Last Dose   acetaminophen (TYLENOL) 500 MG tablet Take 1,000 mg by mouth every 6 (six) hours as needed for mild pain, moderate pain or headache.      folic acid (FOLVITE) 1 MG tablet Take 1 mg by mouth daily.      HYDROmorphone (DILAUDID) 4 MG tablet Take 4 mg by mouth every 6 (six) hours.      ibuprofen (ADVIL) 200 MG tablet Take 800 mg by mouth 3 (three) times daily as needed for fever, headache or mild pain.      OXBRYTA 500 MG TABS tablet Take 1,500 mg by mouth daily in the afternoon.      oxycodone (ROXICODONE) 30 MG immediate release tablet Take 30 mg by mouth every 4 (four) hours.       Allergies: Patient has no known allergies. 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   gets them frequently   Tachycardia with heart rate 100-120 beats per minute    Vitamin B12 deficiency 12/2018   Vitamin D deficiency    Past Surgical History:  Procedure Laterality  Date   CESAREAN SECTION  2013   x 1   TOOTH EXTRACTION N/A 10/18/2021   Procedure: DENTAL RESTORATION/EXTRACTIONS;  Surgeon: Diona Browner, DMD;  Location: [place];  Service: Oral Surgery;  Laterality: N/A;   Family History  Problem Relation Age of Onset   Hypertension Mother    Burch cell trait Mother    Diabetes Father    Burch cell trait Father    Social History   Socioeconomic History   Marital status: Single    Spouse name: Not on file   Number of children: Not on file   Years of education: Not on file   Highest education level: Not on file  Occupational History   Not on file  Tobacco Use   Smoking status: Never   Smokeless tobacco: Never  Vaping Use   Vaping Use: Never used  Substance and Sexual Activity   Alcohol use: Not Currently   Drug use: Never   Sexual activity: Yes    Birth control/protection: None  Other Topics Concern   Not on file  Social History Narrative   Not on file   Social Determinants of Health   Financial Resource Strain: Not on file  Food Insecurity: No Food Insecurity (02/17/2022)   Hunger Vital  Sign    Worried About Charity fundraiser in the Last Year: Never true    [place] in the Last Year: Never true  Transportation Needs: No Transportation Needs (02/17/2022)   PRAPARE - Hydrologist (Medical): No    Lack of Transportation (Non-Medical): No  Physical Activity: Not on file  Stress: Not on file  Social Connections: Not on file  Intimate Partner Violence: Not At Risk (02/17/2022)   Humiliation, Afraid, Rape, and Kick questionnaire    Fear of Current or Ex-Partner: No    Emotionally Abused: No    Physically Abused: No    Sexually Abused: No   Review of Systems  Constitutional:  Negative for chills and fever.  HENT: Negative.    Cardiovascular: Negative.   Skin: Negative.     Physical Exam: Last menstrual period 01/30/2022. {Exam, Complete:17964}  Lab results: No results found for this or  any previous visit (from the past 24 hour(s)).  Imaging results:  No results found.   Assessment & Plan: @PROBHOSP @  Stephanie Burch 03/03/2022, 9:08 AM

## 2022-03-03 NOTE — Discharge Summary (Incomplete)
[facility] Discharge Summary   Patient ID: ARDYS HATAWAY MRN: [id number] DOB/AGE: [birth year] 42 y.o.  Admit date: 03/03/2022 Discharge date: 03/03/2022  Primary Care Physician:  Patient, No Pcp Per  Admission Diagnoses:  Active Problems:   Sickle cell pain crisis [facility])   Discharge Medications:  Allergies as of 03/03/2022   No Known Allergies      Medication List     TAKE these medications    acetaminophen 500 MG tablet Commonly known as: TYLENOL Take 1,000 mg by mouth every 6 (six) hours as needed for mild pain, moderate pain or headache.   folic acid 1 MG tablet Commonly known as: FOLVITE Take 1 mg by mouth daily.   HYDROmorphone 4 MG tablet Commonly known as: DILAUDID Take 4 mg by mouth every 6 (six) hours.   ibuprofen 200 MG tablet Commonly known as: ADVIL Take 800 mg by mouth 3 (three) times daily as needed for fever, headache or mild pain.   Oxbryta 500 MG Tabs tablet Generic drug: voxelotor Take 1,500 mg by mouth daily in the afternoon.   oxycodone 30 MG immediate release tablet Commonly known as: ROXICODONE Take 30 mg by mouth every 4 (four) hours.         Consults:  None  Significant Diagnostic Studies:  No results found.  History of present illness:  Layton Tappan is a 42 year old female with a medical history significant for sickle cell disease, chronic pain syndrome, opiate dependence and  tolerance, and history of anemia of chronic disease  [facility] Course:  Physical Exam at Discharge:  BP (!) 151/95 (BP Location: Left Arm)   Pulse 84   Temp 98 F (36.7 C) (Temporal)   Resp 16   LMP 01/30/2022 (Exact Date)   SpO2 97%   Physical Exam Constitutional:      Appearance: Normal appearance. She is obese.  Eyes:     Pupils: Pupils are equal, round, and reactive to light.  Cardiovascular:     Rate and Rhythm: Normal rate and regular rhythm.  Abdominal:     General: Bowel sounds are normal.   Musculoskeletal:        General: Normal range of motion.  Skin:    General: Skin is warm.  Neurological:     General: No focal deficit present.     Mental Status: She is alert.  Psychiatric:        Mood and Affect: Mood normal.        Behavior: Behavior normal.        Thought Content: Thought content normal.        Judgment: Judgment normal.      Disposition at Discharge: Discharge disposition: 01-Home or Self Care       Discharge Orders: Discharge Instructions     Discharge patient   Complete by: As directed    Discharge disposition: 01-Home or Self Care   Discharge patient date: 03/03/2022       Condition at Discharge:   Stable  Time spent on Discharge:  Greater than 30 minutes.  Signed: [name]  APRN, MSN, FNP-C [facility] [address] [place], [place] [address] [phone]  03/03/2022, 12:23 PM

## 2022-03-03 NOTE — Telephone Encounter (Signed)
Pt called day hospital looking to come in today for sickle cell pain treatment. Pt reports 8/10 pain to bilateral legs. Pt states that she last took 20 mg Oxycodone at 10 PM last night and is now out of pain medication. Pt states that she was last seen in the Emergency Room on Saturday. Pt screened for COVID and denies symptoms and exposure. Pt denies fever, chest pain, and N/V/D. Pt endorses having some abdominal pain due to her menstrual cycle about to start and pt says this is typical. [place] Hollis, FNP notified and she advises that pt hydrates and manages pain at home. Provider said pt can come to day hospital, but will only receive oral pain medication today. Pt notified about plan of care today, agrees to plan and wants to come to day hospital today for treatment. Patient states that her friend will be her transportation today.

## 2022-03-03 NOTE — Progress Notes (Signed)
Pt admitted to day hospital today for sickle cell pain treatment. On arrival, pt rates 8/10 pain to bilateral legs. For pain, Pt received 1 x subcutaneous dose of Dilaudid 2 mg, 4 mg PO Dilaudid and OxyContin PO 20 mg. Pt also received IV Zofran 4 mg, Toradol 15 mg, and hydrated with IV fluids via PIV. At discharge, pt still rated pain 8/10. AVS offered, but pt declined. Pt alert, oriented, and ambulatory at discharge.

## 2022-03-04 ENCOUNTER — Ambulatory Visit (INDEPENDENT_AMBULATORY_CARE_PROVIDER_SITE_OTHER): Payer: Medicare Other | Admitting: Nurse Practitioner

## 2022-03-04 ENCOUNTER — Encounter: Payer: Self-pay | Admitting: Nurse Practitioner

## 2022-03-04 VITALS — BP 125/74 | HR 111 | Temp 97.8°F | Ht 65.0 in | Wt 319.4 lb

## 2022-03-04 DIAGNOSIS — D571 Sickle-cell disease without crisis: Secondary | ICD-10-CM

## 2022-03-04 DIAGNOSIS — F321 Major depressive disorder, single episode, moderate: Secondary | ICD-10-CM | POA: Diagnosis not present

## 2022-03-04 DIAGNOSIS — E559 Vitamin D deficiency, unspecified: Secondary | ICD-10-CM | POA: Diagnosis not present

## 2022-03-04 DIAGNOSIS — G894 Chronic pain syndrome: Secondary | ICD-10-CM | POA: Diagnosis not present

## 2022-03-04 MED ORDER — NALOXONE HCL 4 MG/0.1ML NA LIQD: NASAL | 2 refills | Status: AC

## 2022-03-04 MED ORDER — VITAMIN D (ERGOCALCIFEROL) 1.25 MG (50000 UNIT) PO CAPS: 50000.0000 [IU] | ORAL_CAPSULE | ORAL | 0 refills | Status: DC

## 2022-03-04 NOTE — Assessment & Plan Note (Signed)
Sickle cell disease -continue folic acid 1 mg daily, Oxbryta 1500 mg daily.  The need for good hydration, avoiding heat and cold stress and infection triggers discussed.  Patient encouraged to seek immediate emergency care if any sign of infection occurs.   Pulmonary evaluation -patient denies shortness of breath, wheezing, cough .if symptoms occur with discussed referral to pulmonology .  Cardiac -patient denies chest pain, dizziness, syncope .  Eye - High risk of proliferative retinopathy. Annual eye exam with retinal exam recommended to patient.  Stated that she has upcoming eye exam at [facility].   Immunization status -patient declined flu vaccine today she was encouraged to consider getting the vaccine.  Check on the status of her order vaccination at next appointment   Acute and chronic painful episodes -he is currently on Dilaudid 4 mg every 4 hours for moderate to severe  pain, oxycodone 30 mg every 4 hours, for moderate to sever pain, ibuprofen 800 mg 3 times daily as needed for mild pain.  We agreed to start OxyContin 20 mg every 12 hours, oxycodone 30 mg every 6 hours.  Plan will start once she runs out of Dilaudid which she just refilled today.  Controlled substance agreement form reviewed and signed today.  She knows to be getting her controlled substance only from this office henceforth.  She will continue ibuprofen 800 mg as needed for mild pain Patient will follow-up in 3 months

## 2022-03-04 NOTE — Assessment & Plan Note (Signed)
Will start OxyContin 20 mg every 12 hours, oxycodone 30 mg every 6 hours once she is out of Dilaudid.  Plan discussed with the patient she was in agreement today.   - naloxone (NARCAN) nasal spray 4 mg/0.1 mL; Use as needed for opiod overdose  Dispense: 1 each; Refill: 2

## 2022-03-04 NOTE — Assessment & Plan Note (Signed)
[place] Office Visit from 03/04/2022 in [place]  PHQ-9 Total Score 14     Denies SI, HI She is interested in counseling Referral placed today

## 2022-03-04 NOTE — Progress Notes (Signed)
New Patient Office Visit  Subjective:  Patient ID: Stephanie Burch, female    DOB: 01-Apr-1980  Age: 42 y.o. MRN: [id number]  CC:  Chief Complaint  Patient presents with   Establish Care    HPI Stephanie Burch is a 42 y.o. female with past medical history of sickle cell disease, chronic pain syndrome, opiate dependence and tolerance, frequent hospitalizations, anemia of chronic disease morbid obesity presents to reestablish care for her chronic medical conditions.  She has been receiving care for her sickle cell disease from hematology team at [facility].  For chronic pain management she is currently on Dilaudid 4 mg every 6 hours as needed, oxycodone 30 mg every 4 hours as needed.  She has been in the hospital frequently due to her uncontrolled pain, her sickle cell pain is usually in her left arm, bilateral knees extending to her hips.  She has been following up with hematologist every 3 months.  Takes folic acid 1 mg daily, oxbryta 1500 mg daily.  Has upcoming appointment with ophthalmologist in April.  Due for flu vaccine she declined flu vaccine today.  She was in courage to consider getting the vaccine.  Patient currently denies fever, chills, malaise, chest pain, shortness of breath, wheezing, abdominal pain.    Patient complains of depression due to her chronic medical condition.  She denies SI, HI.  Stated that she was seen therapist in the past reports that therapy was helpful.  I discussed the need for medication and resuming therapy.  She would like to resume therapy, not interested in medication at this time.     Past Medical History:  Diagnosis Date   Acute kidney injury ([employer]) 11/07/2019   Anemia    Chronic pain    COVID-19 virus detected 10/22/2019   Depression    HCAP (healthcare-associated pneumonia) 11/07/2019   History of blood transfusion 2013   x 2   HSV infection    Hypoxia    Pneumonia    x 1   Pulmonary edema 11/11/2019   Sepsis ([place]) 11/07/2019    Sickle cell anemia ([place]) 09/2021   gets them frequently   Tachycardia with heart rate 100-120 beats per minute    Vitamin B12 deficiency 12/2018   Vitamin D deficiency     Past Surgical History:  Procedure Laterality Date   CESAREAN SECTION  2013   x 1   TOOTH EXTRACTION N/A 10/18/2021   Procedure: DENTAL RESTORATION/EXTRACTIONS;  Surgeon: Diona Browner, DMD;  Location: [facility];  Service: Oral Surgery;  Laterality: N/A;    Family History  Problem Relation Age of Onset   Hypertension Mother    Sickle cell trait Mother    Diabetes Father    Sickle cell trait Father     Social History   Socioeconomic History   Marital status: Single    Spouse name: Not on file   Number of children: Not on file   Years of education: Not on file   Highest education level: Not on file  Occupational History   Not on file  Tobacco Use   Smoking status: Never   Smokeless tobacco: Never  Vaping Use   Vaping Use: Never used  Substance and Sexual Activity   Alcohol use: Not Currently   Drug use: Never   Sexual activity: Yes    Birth control/protection: None  Other Topics Concern   Not on file  Social History Narrative   Not on file   Social Determinants of  Health   Financial Resource Strain: Not on file  Food Insecurity: No Food Insecurity (02/17/2022)   Hunger Vital Sign    Worried About Running Out of Food in the Last Year: Never true    Ran Out of Food in the Last Year: Never true  Transportation Needs: No Transportation Needs (02/17/2022)   PRAPARE - Administrator, Civil Service (Medical): No    Lack of Transportation (Non-Medical): No  Physical Activity: Not on file  Stress: Not on file  Social Connections: Not on file  Intimate Partner Violence: Not At Risk (02/17/2022)   Humiliation, Afraid, Rape, and Kick questionnaire    Fear of Current or Ex-Partner: No    Emotionally Abused: No    Physically Abused: No    Sexually Abused: No    ROS Review of Systems   Constitutional: Negative.  Negative for activity change, appetite change, chills, diaphoresis, fatigue and fever.  Eyes:  Negative for photophobia, redness and visual disturbance.  Respiratory: Negative.  Negative for apnea, cough, choking, chest tightness, shortness of breath and stridor.   Cardiovascular: Negative.  Negative for chest pain, palpitations and leg swelling.  Gastrointestinal: Negative.  Negative for abdominal distention, abdominal pain, anal bleeding and blood in stool.  Musculoskeletal:  Positive for arthralgias.  Neurological:  Negative for dizziness, seizures, facial asymmetry, light-headedness, numbness and headaches.  Psychiatric/Behavioral:  Negative for agitation, behavioral problems, confusion, decreased concentration, dysphoric mood, self-injury and suicidal ideas.     Objective:   Today's Vitals: BP 125/74   Pulse (!) 111   Temp 97.8 F (36.6 C)   Ht 5\' 5"  (1.651 m)   Wt (!) 319 lb 6.4 oz (144.9 kg)   LMP 01/30/2022 (Exact Date)   SpO2 99%   BMI 53.15 kg/m   Physical Exam Constitutional:      General: She is not in acute distress.    Appearance: She is obese. She is not ill-appearing, toxic-appearing or diaphoretic.  Eyes:     General: No scleral icterus.       Right eye: No discharge.        Left eye: No discharge.     Extraocular Movements: Extraocular movements intact.  Cardiovascular:     Rate and Rhythm: Normal rate and regular rhythm.     Pulses: Normal pulses.     Heart sounds: Normal heart sounds. No murmur heard.    No friction rub. No gallop.  Pulmonary:     Effort: Pulmonary effort is normal. No respiratory distress.     Breath sounds: Normal breath sounds. No stridor. No wheezing, rhonchi or rales.  Chest:     Chest wall: No tenderness.  Abdominal:     General: There is no distension.     Palpations: Abdomen is soft.     Tenderness: There is no abdominal tenderness. There is no guarding.  Musculoskeletal:        General:  Tenderness present. No deformity or signs of injury.     Right lower leg: No edema.     Left lower leg: No edema.     Comments: Has tenderness on range of motion of bilateral knees, left shoulder, skin warm and dry has palpable radial pulses and pedal pulses.  No redness or swelling noted, sensation intact  Skin:    General: Skin is warm and dry.  Neurological:     Mental Status: She is alert and oriented to person, place, and time.     Cranial Nerves: No cranial  nerve deficit.     Sensory: No sensory deficit.     Motor: No weakness.     Coordination: Coordination normal.     Gait: Gait normal.  Psychiatric:        Mood and Affect: Mood normal.        Behavior: Behavior normal.        Thought Content: Thought content normal.        Judgment: Judgment normal.     Assessment & Plan:   Problem List Items Addressed This Visit       Other   Chronic pain syndrome (Chronic)    Will start OxyContin 20 mg every 12 hours, oxycodone 30 mg every 6 hours once she is out of Dilaudid.  Plan discussed with the patient she was in agreement today.   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  The need for good hydration, avoiding heat and cold stress and infection triggers discussed.  Patient encouraged to seek immediate emergency care if any sign of infection occurs.   Pulmonary evaluation -patient denies shortness of breath, wheezing, cough .if symptoms occur with discussed referral to pulmonology .  Cardiac -patient denies chest pain, dizziness, syncope .  Eye - High risk of proliferative retinopathy. Annual eye exam with retinal exam recommended to patient.  Stated that she has upcoming eye exam at [facility].   Immunization status -patient declined flu vaccine today she was encouraged  to consider getting the vaccine.  Check on the status of her order vaccination at next appointment   Acute and chronic painful episodes -he is currently on Dilaudid 4 mg every 4 hours for moderate to severe  pain, oxycodone 30 mg every 4 hours, for moderate to sever pain, ibuprofen 800 mg 3 times daily as needed for mild pain.  We agreed to start OxyContin 20 mg every 12 hours, oxycodone 30 mg every 6 hours.  Plan will start once she runs out of Dilaudid which she just refilled today.  Controlled substance agreement form reviewed and signed today.  She knows to be getting her controlled substance only from this office henceforth.  She will continue ibuprofen 800 mg as needed for mild pain Patient will follow-up in 3 months       Relevant Medications   naloxone (NARCAN) nasal spray 4 mg/0.1 mL   Vitamin D deficiency - Primary    Recent vitamin D level was 10 Start vitamin D 50,000 units once weekly.  Recheck labs in 3 months      Relevant Medications   Vitamin D, Ergocalciferol, (DRISDOL) 1.25 MG (50000 UNIT) CAPS capsule   Current moderate episode of major depressive disorder ([place])    [place] Visit from 03/04/2022 in [place]  PHQ-9 Total Score 14     Denies SI, HI She is interested in counseling Referral placed today       Outpatient Encounter Medications as of 03/04/2022  Medication Sig   acetaminophen (TYLENOL) 500 MG tablet Take 1,000 mg by mouth every 6 (six) hours as needed for mild pain, moderate pain or headache.   folic acid (FOLVITE) 1 MG tablet Take 1 mg by mouth daily.   HYDROmorphone (DILAUDID) 4 MG tablet Take 4 mg by  mouth every 6 (six) hours.   ibuprofen (ADVIL) 200 MG tablet Take 800 mg by mouth 3 (three) times daily as needed for fever, headache or mild pain.   naloxone (NARCAN) nasal spray 4 mg/0.1 mL Use as needed for opiod overdose   ondansetron (ZOFRAN) 4 MG tablet Take by mouth.   OXBRYTA 500 MG TABS tablet Take 1,500 mg  by mouth daily in the afternoon.   oxycodone (ROXICODONE) 30 MG immediate release tablet Take 30 mg by mouth every 4 (four) hours.   Vitamin D, Ergocalciferol, (DRISDOL) 1.25 MG (50000 UNIT) CAPS capsule Take 1 capsule (50,000 Units total) by mouth every 7 (seven) days.   No facility-administered encounter medications on file as of 03/04/2022.    Follow-up: Return in about 3 months (around 06/03/2022) for Sickle cell disease.   Renee Rival, FNP

## 2022-03-04 NOTE — Patient Instructions (Signed)
1. Vitamin D deficiency  - Vitamin D, Ergocalciferol, (DRISDOL) 1.25 MG (50000 UNIT) CAPS capsule; Take 1 capsule (50,000 Units total) by mouth every 7 (seven) days.  Dispense: 8 capsule; Refill: 0  2. Hb-SS disease without crisis ([place])  - naloxone (NARCAN) nasal spray 4 mg/0.1 mL; Use as needed for opiod overdose  Dispense: 1 each; Refill: 2   It is important that you exercise regularly at least 30 minutes 5 times a week as tolerated  Think about what you will eat, plan ahead. Choose " clean, green, fresh or frozen" over canned, processed or packaged foods which are more sugary, salty and fatty. 70 to 75% of food eaten should be vegetables and fruit. Three meals at set times with snacks allowed between meals, but they must be fruit or vegetables. Aim to eat over a 12 hour period , example 7 am to 7 pm, and STOP after  your last meal of the day. Drink water,generally about 64 ounces per day, no other drink is as healthy. Fruit juice is best enjoyed in a healthy way, by EATING the fruit.  Thanks for choosing [name] we consider it a privelige to serve you.

## 2022-03-04 NOTE — Assessment & Plan Note (Signed)
Recent vitamin D level was 10 Start vitamin D 50,000 units once weekly.  Recheck labs in 3 months

## 2022-03-05 NOTE — Discharge Summary (Addendum)
[facility] Discharge Summary   Patient ID: Stephanie Burch MRN: [id number] DOB/AGE: [birth year] 40 y.o.  Admit date: 03/03/2022 Discharge date: 03/03/2022  Primary Care Physician:  Renee Rival, FNP  Admission Diagnoses:  Active Problems:   Sickle cell pain crisis [facility])   Discharge Medications:  Allergies as of 03/03/2022   No Known Allergies      Medication List     TAKE these medications    acetaminophen 500 MG tablet Commonly known as: TYLENOL Take 1,000 mg by mouth every 6 (six) hours as needed for mild pain, moderate pain or headache.   folic acid 1 MG tablet Commonly known as: FOLVITE Take 1 mg by mouth daily.   HYDROmorphone 4 MG tablet Commonly known as: DILAUDID Take 4 mg by mouth every 6 (six) hours.   ibuprofen 200 MG tablet Commonly known as: ADVIL Take 800 mg by mouth 3 (three) times daily as needed for fever, headache or mild pain.   Oxbryta 500 MG Tabs tablet Generic drug: voxelotor Take 1,500 mg by mouth daily in the afternoon.   oxycodone 30 MG immediate release tablet Commonly known as: ROXICODONE Take 30 mg by mouth every 4 (four) hours.         Consults:  None  Significant Diagnostic Studies:  No results found.  History of present illness: Stephanie Burch is a 42 year old female with a medical history significant for sickle cell disease, chronic pain syndrome, morbid obesity, opiate dependence and tolerance, anemia of chronic disease, and frequent hospitalizations presents with complaints of bilateral lower extremity pain that is consistent with her typical pain crisis.  Patient was admitted on 1/12, 1/15, 1/25, along with frequent emergency room visits for this problem.  Patient has been taking hydromorphone and oxycodone at home with no sustained relief.  She last had these medications this a.m.  She reports pain primarily to bilateral hips and lower extremities.  She rates her pain as 9/10.  She has not been able  to get comfortable.  Patient is followed by hematology at [facility], she is seen around every 3 months.  Pain medications are managed by her hematology team.  Patient currently does not have a primary care provider and is scheduled to establish care on tomorrow.  She denies any fever, chills, chest pain, or shortness of breath.  No urinary symptoms, nausea, vomiting, or diarrhea.  No sick contacts, recent travel, or known exposure to COVID-19. [facility] Course: Patient admitted to sickle cell day infusion clinic for management of pain crisis. Reviewed all laboratory values, largely consistent with patient's baseline. Pain managed with Dilaudid SQ x 3, and OxyContin.  Patient continues to rate her pain at 8/10.  Advised to follow-up with her primary care provider on tomorrow.  Primary team advised to discontinue home hydromorphone.  Patient may benefit from long-acting pain medication.  Frequent admissions appear to be secondary to chronic pain exacerbation. Discussed other interventions to assist with pain at length.  Patient's BMI is 53, recommend decreasing weight to assist with bilateral hip pain.  Patient also warrants further workup for metabolic syndrome that includes diabetes screening, thyroid panel.  Also, recommend imaging of bilateral hips to rule out avascular necrosis.  Patient may benefit from an orthopedic specialist referral.  Patient's chronic pain appears to be driving frequent hospitalizations.  Also, this patient warrants an orals only care plan in the emergency department.  Patient is alert, oriented, and ambulating without assistance.  She will discharge  home in a hemodynamically stable condition.  Patient will follow-up to establish care with PCP on 03/04/2022.  She is aware of all upcoming appointments.  Discharge instructions:  Resume all home medications.   Follow up with PCP as previously  scheduled.   Discussed the importance of drinking 64 ounces  of water daily, dehydration of red blood cells may lead further sickling.   Avoid all stressors that precipitate sickle cell pain crisis.     The patient was given clear instructions to go to ER or return to medical center if symptoms do not improve, worsen or new problems develop.    Physical Exam at Discharge:  BP (!) 153/87 (BP Location: Left Arm)   Pulse 89   Temp 97.9 F (36.6 C) (Temporal)   Resp 16   LMP 01/30/2022 (Exact Date)   SpO2 100%  Physical Exam Constitutional:      Appearance: Normal appearance.  Eyes:     Pupils: Pupils are equal, round, and reactive to light.  Cardiovascular:     Rate and Rhythm: Normal rate and regular rhythm.     Pulses: Normal pulses.  Pulmonary:     Effort: Pulmonary effort is normal.  Abdominal:     General: Bowel sounds are normal.  Musculoskeletal:        General: Normal range of motion.  Skin:    General: Skin is warm.  Neurological:     General: No focal deficit present.     Mental Status: She is alert. Mental status is at baseline.  Psychiatric:        Mood and Affect: Mood normal.        Behavior: Behavior normal.        Thought Content: Thought content normal.        Judgment: Judgment normal.       Disposition at Discharge: Discharge disposition: 01-Home or Self Care       Discharge Orders: Discharge Instructions     Discharge patient   Complete by: As directed    Discharge disposition: 01-Home or Self Care   Discharge patient date: 03/03/2022       Condition at Discharge:   Stable  Time spent on Discharge:  Greater than 30 minutes.  Signed: [name] 03/05/2022, 2:38 PM  Evaluation and management procedures were performed by the Advanced Practitioner under my supervision and collaboration. I have reviewed the Advanced Practitioner's note and chart, and I agree with the management and plan.   Angelica Chessman, MD, MHA, CPE, [place], [place],  [place] 03/05/2022, 4:37 PM

## 2022-03-14 ENCOUNTER — Other Ambulatory Visit: Payer: Self-pay

## 2022-03-14 ENCOUNTER — Encounter (HOSPITAL_COMMUNITY): Payer: Self-pay

## 2022-03-14 ENCOUNTER — Other Ambulatory Visit: Payer: Self-pay | Admitting: Nurse Practitioner

## 2022-03-14 ENCOUNTER — Telehealth: Payer: Self-pay | Admitting: Family Medicine

## 2022-03-14 ENCOUNTER — Telehealth (HOSPITAL_COMMUNITY): Payer: Self-pay

## 2022-03-14 ENCOUNTER — Telehealth: Payer: Self-pay | Admitting: Clinical

## 2022-03-14 ENCOUNTER — Telehealth: Payer: Self-pay | Admitting: Nurse Practitioner

## 2022-03-14 ENCOUNTER — Emergency Department (HOSPITAL_COMMUNITY)
Admission: EM | Admit: 2022-03-14 | Discharge: 2022-03-14 | Disposition: A | Discharge status: Home / Self Care | Payer: Medicare Other | Attending: Emergency Medicine | Admitting: Emergency Medicine

## 2022-03-14 DIAGNOSIS — D5700 Hb-SS disease with crisis, unspecified: Secondary | ICD-10-CM

## 2022-03-14 LAB — CBC WITH DIFFERENTIAL/PLATELET
Abs Immature Granulocytes: 0.03 10*3/uL (ref 0.00–0.07)
Basophils Absolute: 0 10*3/uL (ref 0.0–0.1)
Basophils Relative: 0 %
Eosinophils Absolute: 0.1 10*3/uL (ref 0.0–0.5)
Eosinophils Relative: 1 %
HCT: 34.7 % — ABNORMAL LOW (ref 36.0–46.0)
Hemoglobin: 11.7 g/dL — ABNORMAL LOW (ref 12.0–15.0)
Immature Granulocytes: 0 %
Lymphocytes Relative: 18 %
Lymphs Abs: 1.6 10*3/uL (ref 0.7–4.0)
MCH: 24.2 pg — ABNORMAL LOW (ref 26.0–34.0)
MCHC: 33.7 g/dL (ref 30.0–36.0)
MCV: 71.7 fL — ABNORMAL LOW (ref 80.0–100.0)
Monocytes Absolute: 0.7 10*3/uL (ref 0.1–1.0)
Monocytes Relative: 9 %
Neutro Abs: 6.3 10*3/uL (ref 1.7–7.7)
Neutrophils Relative %: 72 %
Platelets: 323 10*3/uL (ref 150–400)
RBC: 4.84 MIL/uL (ref 3.87–5.11)
RDW: 16 % — ABNORMAL HIGH (ref 11.5–15.5)
WBC: 8.8 10*3/uL (ref 4.0–10.5)
nRBC: 2.5 % — ABNORMAL HIGH (ref 0.0–0.2)

## 2022-03-14 LAB — BASIC METABOLIC PANEL
Anion gap: 7 (ref 5–15)
BUN: 6 mg/dL (ref 6–20)
CO2: 24 mmol/L (ref 22–32)
Calcium: 8.8 mg/dL — ABNORMAL LOW (ref 8.9–10.3)
Chloride: 107 mmol/L (ref 98–111)
Creatinine, Ser: 0.66 mg/dL (ref 0.44–1.00)
GFR, Estimated: >60 mL/min (ref >60)
Glucose, Bld: 116 mg/dL — ABNORMAL HIGH (ref 70–99)
Potassium: 3.9 mmol/L (ref 3.5–5.1)
Sodium: 138 mmol/L (ref 135–145)

## 2022-03-14 LAB — RETICULOCYTES
Immature Retic Fract: 38.6 % — ABNORMAL HIGH (ref 2.3–15.9)
RBC.: 4.78 MIL/uL (ref 3.87–5.11)
Retic Count, Absolute: 310.2 10*3/uL — ABNORMAL HIGH (ref 19.0–186.0)
Retic Ct Pct: 6.5 % — ABNORMAL HIGH (ref 0.4–3.1)

## 2022-03-14 MED ORDER — HYDROMORPHONE HCL 2 MG/ML IJ SOLN
2.0000 mg | Freq: Once | INTRAMUSCULAR | Status: AC
  Administered 2022-03-14: 2 mg via SUBCUTANEOUS
  Filled 2022-03-14: qty 1

## 2022-03-14 MED ORDER — OXYCODONE HCL ER 10 MG PO T12A
20.0000 mg | EXTENDED_RELEASE_TABLET | Freq: Once | ORAL | Status: AC
  Administered 2022-03-14: 20 mg via ORAL
  Filled 2022-03-14: qty 2

## 2022-03-14 MED ORDER — SODIUM CHLORIDE 0.45 % IV SOLN: INTRAVENOUS | Status: DC

## 2022-03-14 MED ORDER — ONDANSETRON HCL 4 MG/2ML IJ SOLN
4.0000 mg | Freq: Once | INTRAMUSCULAR | Status: AC
  Administered 2022-03-14: 4 mg via INTRAVENOUS
  Filled 2022-03-14: qty 2

## 2022-03-14 MED ORDER — KETOROLAC TROMETHAMINE 15 MG/ML IJ SOLN
15.0000 mg | Freq: Once | INTRAMUSCULAR | Status: AC
  Administered 2022-03-14: 15 mg via INTRAVENOUS
  Filled 2022-03-14: qty 1

## 2022-03-14 NOTE — Telephone Encounter (Signed)
Integrated Behavioral Health Progress Note  03/14/2022 Name: Stephanie Burch MRN: [id number] DOB: [birth year] [name] is a 42 y.o. year old female who sees [place], Dewaine Conger, FNP for primary care. LCSW was initially consulted to support with mental health concerns.  Interpreter: No.   Interpreter Name & Language: none  Assessment: Patient experiencing mental health concerns. Patient was scheduled to follow up with CSW on 03/24/22.  Ongoing Intervention: Patient called CSW and cancelled her upcoming [place] ([facility]) appointment. She requested information on mental health providers outside of our office and requested this be emailed to her. CSW emailed patient this information.  Review of patient status, including review of consultants reports, relevant laboratory and other test results, and collaboration with appropriate care team members and the patient's provider was performed as part of comprehensive patient evaluation and provision of services.    Estanislado Emms, [facility] [phone]

## 2022-03-14 NOTE — Telephone Encounter (Signed)
Stephanie Burch is a 42 year old female with a medical history significant for sickle cell disease, [place], chronic pain syndrome, opiate dependence and tolerance, and morbid obesity presented to the emergency department today for increased lower extremity pain.  Pain is typical for her sickle cell related pain.  Prior to reporting to the emergency department, patient contacted sickle cell day infusion center stating that she was almost out of her pain medications and needed to come in for treatment so that she would not run out before her fill date.  Patient then reported to the emergency department for treatment and evaluation.  Patient requested hospital admission.  Patient is experiencing chronic pain and does not warrant hospital admission.  Reviewed PDMP.  Patient last filled oxycodone 30 mg #90 and hydromorphone 4 mg #60 on 03/04/2022.  Patient's earliest fill date is 03/18/2022.  Patient says that she has a few days of medications left, but will run out prior to fill date.  Patient advised to follow-up with sickle cell day infusion center on 03/17/2022 if chronic pain exacerbation persists.  Patient stated, "you all think that the day hospital is the solution for everything, that does not work for me".  Patient hung up abruptly without completing the conversation.  [name]  APRN, MSN, FNP-C [facility] [address] [place], [place] [address] [phone]

## 2022-03-14 NOTE — ED Provider Notes (Signed)
[place] EMERGENCY DEPARTMENT AT [facility] Provider Note   CSN: [id number] Arrival date & time: 03/14/22  [id number]     History  Chief Complaint  Patient presents with   Sickle Cell Pain Crisis    Stephanie Burch is a 42 y.o. female.  42 year old female with prior medical history as detailed below presents for evaluation of sickle cell pain.  Patient reports recurrent pain to both lower extremities.  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  Patient with chronic pain in frequent ED evaluations for same.  Patient reports that pain was uncontrolled by her home medications beginning this a.m.  She denies associated fever.  She denies chest pain or shortness of breath.  She denies recent illness.  The history is provided by the patient and medical records.       Home Medications Prior to Admission medications   Medication Sig Start Date End Date Taking? Authorizing Provider  acetaminophen (TYLENOL) 500 MG tablet Take 1,000 mg by mouth every 6 (six) hours as needed for mild pain, moderate pain or headache.    [provider]  folic acid (FOLVITE) 1 MG tablet Take 1 mg by mouth daily.    [provider]  HYDROmorphone (DILAUDID) 4 MG tablet Take 4 mg by mouth every 6 (six) hours.    [provider]  ibuprofen (ADVIL) 200 MG tablet Take 800 mg by mouth 3 (three) times daily as needed for fever, headache or mild pain.    [provider]  naloxone [facility]) nasal spray 4 mg/0.1 mL Use as needed for opiod overdose 03/04/22   Renee Rival, FNP  ondansetron (ZOFRAN) 4 MG tablet Take by mouth. 02/28/22   [provider]  OXBRYTA 500 MG TABS tablet Take 1,500 mg by mouth daily in the afternoon. 10/04/21   [provider]  oxycodone (ROXICODONE) 30 MG immediate release tablet Take 30 mg by mouth every 4 (four) hours.    [provider]  Vitamin D, Ergocalciferol, (DRISDOL) 1.25  MG (50000 UNIT) CAPS capsule Take 1 capsule (50,000 Units total) by mouth every 7 (seven) days. 03/04/22   Renee Rival, FNP      Allergies    Patient has no known allergies.    Review of Systems   Review of Systems  All other systems reviewed and are negative.   Physical Exam Updated Vital Signs BP (!) 150/113 (BP Location: Left Arm)   Pulse (!) 102   Temp 98.8 F (37.1 C) (Oral)   Resp 20   LMP 01/30/2022 (Exact Date)   SpO2 99%  Physical Exam Vitals and nursing note reviewed.  Constitutional:      General: She is not in acute distress.    Appearance: Normal appearance. She is well-developed.  HENT:     Head: Normocephalic and atraumatic.  Eyes:     Conjunctiva/sclera: Conjunctivae normal.     Pupils: Pupils are equal, round, and reactive to light.  Cardiovascular:     Rate and Rhythm: Normal rate and regular rhythm.     Heart sounds: Normal heart sounds.  Pulmonary:     Effort: Pulmonary effort is normal. No respiratory distress.     Breath sounds: Normal breath sounds.  Abdominal:     General: There is no distension.     Palpations: Abdomen is soft.     Tenderness: There is no abdominal tenderness.  Musculoskeletal:        General:  No deformity. Normal range of motion.     Cervical back: Normal range of motion and neck supple.  Skin:    General: Skin is warm and dry.  Neurological:     General: No focal deficit present.     Mental Status: She is alert and oriented to person, place, and time.     ED Results / Procedures / Treatments   Labs (all labs ordered are listed, but only abnormal results are displayed) Labs Reviewed  CBC WITH DIFFERENTIAL/PLATELET - Abnormal; Notable for the following components:      Result Value   Hemoglobin 11.7 (*)    HCT 34.7 (*)    MCV 71.7 (*)    MCH 24.2 (*)    RDW 16.0 (*)    nRBC 2.5 (*)    All other components within normal limits  BASIC METABOLIC PANEL - Abnormal; Notable for the following components:    Glucose, Bld 116 (*)    Calcium 8.8 (*)    All other components within normal limits  RETICULOCYTES - Abnormal; Notable for the following components:   Retic Ct Pct 6.5 (*)    Retic Count, Absolute 310.2 (*)    Immature Retic Fract 38.6 (*)    All other components within normal limits    EKG None  Radiology No results found.  Procedures Procedures    Medications Ordered in ED Medications  0.45 % sodium chloride infusion ( Intravenous New Bag/Given 03/14/22 0929)  HYDROmorphone (DILAUDID) injection 2 mg (has no administration in time range)  HYDROmorphone (DILAUDID) injection 2 mg (2 mg Subcutaneous Given 03/14/22 0901)  oxyCODONE (OXYCONTIN) 12 hr tablet 20 mg (20 mg Oral Given 03/14/22 0913)  ondansetron (ZOFRAN) injection 4 mg (4 mg Intravenous Given 03/14/22 0919)  ketorolac (TORADOL) 15 MG/ML injection 15 mg (15 mg Intravenous Given 03/14/22 0920)    ED Course/ Medical Decision Making/ A&P                             Medical Decision Making Amount and/or Complexity of Data Reviewed Labs: ordered.  Risk Prescription drug management.    Medical Screen Complete  This patient presented to the ED with complaint of sickle cell pain.  This complaint involves an extensive number of treatment options. 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  Screening labs obtained are without significant abnormality.  Patient given multiple pain modalities and rounds of pain medicine here in the ED.  Patient requested admission.  Case discussed with [place] Hollis - patient is well-known to her.  Patient does not meet criteria for admission per  solace.  Patient to be discharged.  Patient understands need for close outpatient follow-up.  Strict return precautions given and understood. Additional history obtained:  External records from outside sources obtained and reviewed including prior ED visits and prior Inpatient records.    Lab Tests:  I ordered and personally interpreted labs.  The pertinent results include: CBC, BMP, Retic  Cardiac Monitoring:  The patient was maintained on a cardiac monitor.  I personally viewed and interpreted the cardiac monitor which showed an underlying rhythm of: NSR   Medicines ordered:  I ordered medication including Dilaudid,  Toradol, OxyContin for pain Reevaluation of the patient after these medicines showed that the patient: improved   Problem List / ED Course:  Sickle cell pain   Reevaluation:  After the interventions noted above, I reevaluated the patient and found that they have: improved  Disposition:  After consideration of the diagnostic results and the patients response to treatment, I feel that the patent would benefit from close outpatient follow-up.          Final Clinical Impression(s) / ED Diagnoses Final diagnoses:  Sickle cell pain crisis [facility])    Rx / DC Orders ED Discharge Orders     None         Valarie Merino, MD 03/14/22 1349

## 2022-03-14 NOTE — Telephone Encounter (Signed)
Pt called day hospital looking to come in today for sickle cell pain treatment. Pt rates 8/10 pain to bilateral legs. Pt reports last taking Oxycodone 30 mg between midnight and 1 AM. Pt denies being to ER. Pt screened for COVID denies symptoms and exposure. Pt denies fever, chest pain, N/V/D, and abdominal pain. [place] Hollis, [place] notified and due to day hospital already being at capacity pt unable to come today. Pt notified and verbalized understanding.

## 2022-03-14 NOTE — Telephone Encounter (Signed)
Can someone please call pt about her medication

## 2022-03-14 NOTE — Discharge Instructions (Signed)
Return for any problem.  ?

## 2022-03-14 NOTE — ED Triage Notes (Signed)
Pt c/o sickle cell pain in her legs that started last night.

## 2022-03-14 NOTE — Progress Notes (Signed)
Plan dicussed with patient at her last visit . Will start OxyContin 20 mg every 12 hours, oxycodone 30 mg every 6 hours once she is out of Dilaudid.    Today patient:called  for refills of both short acting and long acting oxycodone.  States that has 2 more days supply of oxycodone 27m tablets and that she is out of hydromorphone .  She is not due for refills of her hydromorphone 496mtablets   and oxycodone 30 mg tablets until 03/18/2022.   She initially told me that she is out of hydromorphone but she later told me that she threw the medication away because it was not working for her.  I have discussed her case with [place], [place] will call the patient and update  her about the plan. I did discuss with the patient that her safety is very important to [place]  Medications were not refilled today

## 2022-03-17 NOTE — Telephone Encounter (Signed)
Called pt and left a message for return call.

## 2022-03-19 ENCOUNTER — Telehealth: Payer: Self-pay

## 2022-03-19 NOTE — Transitions of Care (Post Inpatient/ED Visit) (Signed)
   03/19/2022  Name: Stephanie Burch MRN: [id number] DOB: 1980-04-19  Today's TOC FU Call Status: Today's TOC FU Call Status:: Unsuccessul Call (1st Attempt) Unsuccessful Call (1st Attempt) Date: 03/19/22  Attempted to reach the patient regarding the most recent Inpatient/ED visit.  Follow Up Plan: Additional outreach attempts will be made to reach the patient to complete the Transitions of Care (Post Inpatient/ED visit) call.   [place] LPN [place] Advisor Direct Dial [phone]

## 2022-03-20 NOTE — Transitions of Care (Post Inpatient/ED Visit) (Signed)
   03/20/2022  Name: Stephanie Burch MRN: [id number] DOB: Oct 08, 1980  Today's TOC FU Call Status: Today's TOC FU Call Status:: Unsuccessful Call (2nd Attempt) Unsuccessful Call (1st Attempt) Date: 03/19/22 Unsuccessful Call (2nd Attempt) Date: 03/20/22  Attempted to reach the patient regarding the most recent Inpatient/ED visit.  Follow Up Plan: Additional outreach attempts will be made to reach the patient to complete the Transitions of Care (Post Inpatient/ED visit) call.   [place] LPN [place] Advisor Direct Dial [phone]

## 2022-03-24 ENCOUNTER — Institutional Professional Consult (permissible substitution): Payer: Medicare Other | Admitting: Clinical

## 2022-03-24 NOTE — Transitions of Care (Post Inpatient/ED Visit) (Signed)
   03/24/2022  Name: Stephanie Burch MRN: [id number] DOB: [birth year]  Today's TOC FU Call Status: Today's TOC FU Call Status:: Unsuccessful Call (3rd Attempt) Unsuccessful Call (1st Attempt) Date: 03/19/22 Unsuccessful Call (2nd Attempt) Date: 03/20/22 Unsuccessful Call (3rd Attempt) Date: 03/24/22  Attempted to reach the patient regarding the most recent Inpatient/ED visit.  Follow Up Plan: No further outreach attempts will be made at this time. We have been unable to contact the patient.  [place] LPN [place] Advisor Direct Dial [phone]

## 2022-03-24 NOTE — Telephone Encounter (Signed)
WE have reached out to you concerning your medication. Please advise of your question here and I will do my best to answer it. Hope all is well and have a great day.   Elyse Jarvis RMA

## 2022-04-01 ENCOUNTER — Telehealth: Payer: Self-pay

## 2022-04-01 NOTE — Transitions of Care (Post Inpatient/ED Visit) (Unsigned)
   04/01/2022  Name: Stephanie Burch MRN: [id number] DOB: [birth year]  Today's TOC FU Call Status: Today's TOC FU Call Status:: Unsuccessul Call (1st Attempt) Unsuccessful Call (1st Attempt) Date: 04/01/22  Attempted to reach the patient regarding the most recent Inpatient/ED visit.  Follow Up Plan: Additional outreach attempts will be made to reach the patient to complete the Transitions of Care (Post Inpatient/ED visit) call.   [place] LPN [place] Advisor Direct Dial [phone]

## 2022-04-02 NOTE — Transitions of Care (Post Inpatient/ED Visit) (Unsigned)
   04/02/2022  Name: Stephanie Burch MRN: [id number] DOB: 1980-02-12  Today's TOC FU Call Status: Today's TOC FU Call Status:: Unsuccessful Call (2nd Attempt) Unsuccessful Call (1st Attempt) Date: 04/01/22 Unsuccessful Call (2nd Attempt) Date: 04/02/22  Attempted to reach the patient regarding the most recent Inpatient/ED visit.  Follow Up Plan: Additional outreach attempts will be made to reach the patient to complete the Transitions of Care (Post Inpatient/ED visit) call.   [place] LPN [place] Advisor Direct Dial [phone]

## 2022-04-03 ENCOUNTER — Telehealth: Payer: Self-pay | Admitting: Nurse Practitioner

## 2022-04-03 NOTE — Telephone Encounter (Signed)
Called patient to schedule Medicare Annual Wellness Visit (AWV). Left message for patient to call back and schedule Medicare Annual Wellness Visit (AWV).  Last date of AWV: AWVI eligible as of 04/03/2021  Please schedule an appointment at any time with [place], [facility].  If any questions, please contact me at [phone].    Thank you,  [place] Direct dial  [phone]

## 2022-04-03 NOTE — Transitions of Care (Post Inpatient/ED Visit) (Signed)
   04/03/2022  Name: Stephanie Burch MRN: [id number] DOB: 1980-02-09  Today's TOC FU Call Status: Today's TOC FU Call Status:: Unsuccessful Call (3rd Attempt) Unsuccessful Call (1st Attempt) Date: 04/01/22 Unsuccessful Call (2nd Attempt) Date: 04/02/22 Unsuccessful Call (3rd Attempt) Date: 04/03/22  Attempted to reach the patient regarding the most recent Inpatient/ED visit.  Follow Up Plan: No further outreach attempts will be made at this time. We have been unable to contact the patient.  [place] LPN [place] Advisor Direct Dial [phone]

## 2022-05-01 ENCOUNTER — Telehealth: Payer: Self-pay | Admitting: Nurse Practitioner

## 2022-05-01 NOTE — Telephone Encounter (Signed)
Called patient to schedule Medicare Annual Wellness Visit (AWV). Left message for patient to call back and schedule Medicare Annual Wellness Visit (AWV).  Last date of AWV:  AWVI eligible as of 04/03/2021  Please schedule an AWVI appointment at any time with [place].  If any questions, please contact me at [phone].    Thank you,  [place] Direct dial  [phone]

## 2022-05-28 ENCOUNTER — Telehealth: Payer: Self-pay

## 2022-05-28 NOTE — Transitions of Care (Post Inpatient/ED Visit) (Signed)
   05/28/2022  Name: Stephanie Burch MRN: [id number] DOB: 1980/06/04  Today's TOC FU Call Status: Today's TOC FU Call Status:: Unsuccessul Call (1st Attempt) Unsuccessful Call (1st Attempt) Date: 05/28/22  Attempted to reach the patient regarding the most recent Inpatient/ED visit.  Follow Up Plan: Additional outreach attempts will be made to reach the patient to complete the Transitions of Care (Post Inpatient/ED visit) call.   Signature Karena Addison, LPN [facility] Nurse Health Advisor Direct Dial [phone]

## 2022-06-02 NOTE — Transitions of Care (Post Inpatient/ED Visit) (Signed)
   06/02/2022  Name: Stephanie Burch MRN: [id number] DOB: 10-03-1980 Patient already seen in office Today's TOC FU Call Status: Today's TOC FU Call Status:: Unsuccessul Call (1st Attempt) Unsuccessful Call (1st Attempt) Date: 05/28/22  Attempted to reach the patient regarding the most recent Inpatient/ED visit.  Follow Up Plan: No further outreach attempts will be made at this time. We have been unable to contact the patient.  Signature Karena Addison, LPN [facility] Nurse Health Advisor Direct Dial [phone]

## 2022-06-03 ENCOUNTER — Ambulatory Visit: Payer: Medicare Other | Admitting: Nurse Practitioner

## 2022-06-17 ENCOUNTER — Ambulatory Visit: Payer: Self-pay | Admitting: Nurse Practitioner

## 2022-07-25 ENCOUNTER — Telehealth: Payer: Self-pay

## 2022-07-25 NOTE — Transitions of Care (Post Inpatient/ED Visit) (Signed)
   07/25/2022  Name: Stephanie Burch MRN: [id number] DOB: [birth year]  Today's TOC FU Call Status: Today's TOC FU Call Status:: Unsuccessul Call (1st Attempt) Unsuccessful Call (1st Attempt) Date: 07/25/22  Attempted to reach the patient regarding the most recent Inpatient/ED visit.  Follow Up Plan: Additional outreach attempts will be made to reach the patient to complete the Transitions of Care (Post Inpatient/ED visit) call.   Signature Kandis Fantasia, LPN [facility] Health Advisor [place] [facility] You Are. We Are. One [employer] # [phone]

## 2022-09-22 ENCOUNTER — Telehealth: Payer: Self-pay

## 2022-09-22 NOTE — Transitions of Care (Post Inpatient/ED Visit) (Signed)
   09/22/2022  Name: Stephanie Burch MRN: [id number] DOB: 04-Jul-1980  Today's TOC FU Call Status: Today's TOC FU Call Status:: Unsuccessful Call (1st Attempt) Unsuccessful Call (1st Attempt) Date: 09/22/22  Attempted to reach the patient regarding the most recent Inpatient/ED visit.  Follow Up Plan: Additional outreach attempts will be made to reach the patient to complete the Transitions of Care (Post Inpatient/ED visit) call.   Signature Alyse Low, RN, BA, [facility], [facility] Management Coordinator, transitions of Care Ph [phone]

## 2022-09-25 ENCOUNTER — Telehealth: Payer: Self-pay

## 2022-09-25 NOTE — Transitions of Care (Post Inpatient/ED Visit) (Signed)
09/25/2022  Name: SUKHJIT ERK MRN: [id number] DOB: Nov 10, 1980  Today's TOC FU Call Status: Today's TOC FU Call Status:: Successful TOC FU Call Completed TOC FU Call Complete Date: 09/25/22  Transition Care Management Follow-up Telephone Call Date of Discharge: 09/21/22 Discharge Facility: Other Recruitment consultant Facility) Name of Other (Non-[facility]) Discharge Facility: [facility] [place] Type of Discharge: Inpatient Admission Primary Inpatient Discharge Diagnosis:: sickle cell pain crisis How have you been since you were released from the hospital?: Better Any questions or concerns?: No  Items Reviewed: Did you receive and understand the discharge instructions provided?: Yes Medications obtained,verified, and reconciled?: Yes (Medications Reviewed) Any new allergies since your discharge?: No Dietary orders reviewed?: NA  Medications Reviewed Today: Medications Reviewed Today     Reviewed by Marcos Eke, RN (Registered Nurse) on 09/25/22 at 1353  Med List Status: <None>   Medication Order Taking? Sig Documenting Provider Last Dose Status Informant  acetaminophen (TYLENOL) 500 MG tablet [id number]  Take 1,000 mg by mouth every 6 (six) hours as needed for mild pain, moderate pain or headache. [provider]  Active Self           Med Note Renelda Loma   Tue Mar 04, 2022  3:18 PM) Prn   folic acid (FOLVITE) 1 MG tablet [id number]  Take 1 mg by mouth daily. [provider]  Active Self  HYDROmorphone (DILAUDID) 4 MG tablet [id number]  Take 4 mg by mouth every 6 (six) hours. [provider]  Active Self           Med Note Renelda Loma   Tue Mar 04, 2022  3:18 PM) PRn   ibuprofen (ADVIL) 200 MG tablet [id number]  Take 800 mg by mouth 3 (three) times daily as needed for fever, headache or mild pain. [provider]  Active Self           Med Note Renelda Loma   Tue Mar 04, 2022  3:18 PM) Prn   naloxone [facility]) nasal  spray 4 mg/0.1 mL [id number]  Use as needed for opiod overdose [place], Baird Kay, FNP  Active   ondansetron (ZOFRAN) 4 MG tablet [id number]  Take by mouth. [provider]  Active            Med Note Renelda Loma   Tue Mar 04, 2022  3:19 PM) Prn   OXBRYTA 500 MG TABS tablet [id number]  Take 1,500 mg by mouth daily in the afternoon. [provider]  Active Self  oxycodone (ROXICODONE) 30 MG immediate release tablet [id number] Yes Take 30 mg by mouth every 4 (four) hours. [provider] Taking Active Self           Med Note Mliss Fritz, Mindy Gali A   Thu Sep 25, 2022  1:52 PM) As of 8/22, currently taking Oxycodone 30mg  q4hr for sickle cell pain, received 15 d/90 tab RX on 09/21/22  Vitamin D, Ergocalciferol, (DRISDOL) 1.25 MG (50000 UNIT) CAPS capsule [id number]  Take 1 capsule (50,000 Units total) by mouth every 7 (seven) days. Donell Beers, FNP  Active             Home Care and Equipment/Supplies:    Functional Questionnaire: Do you need assistance with bathing/showering or dressing?: No Do you need assistance with meal preparation?: No Do you need assistance with eating?: No Do you have difficulty maintaining continence: No Do you need assistance with getting out of bed/getting out of a chair/moving?: No Do you have difficulty  managing or taking your medications?: No  Follow up appointments reviewed: PCP Follow-up appointment confirmed?: No (Aware that she was advised to f/u with PCP, Edwin Dada after discharge, patient encouraged to make f/u appointment on her own) MD Provider Line [phone] Given: No Specialist Hospital Follow-up appointment confirmed?: NA Do you need transportation to your follow-up appointment?: No Do you understand care options if your condition(s) worsen?: Yes-patient verbalized understanding  Dear Ms. Meyn,  As we discussed, there appears to be no further involvement needed at this time from this Transition  of Care RN. My only recommendation would be to call your PCP, Edwin Dada, NP to review your hospitalization 8/12 thru 8/18, sooner than later. Glad to note that your pain management needs have been addressed and you have RX oxycodone 30mg  q4hr for pain control. Best wishes, Franki Monte, RN, BA, [facility], CRRN [facility] Management Coordinator, Transition of Care Ph # [phone]

## 2022-10-12 IMAGING — CT CT ANGIO CHEST
1 of 6 series · 17 of 36 positions shown · IV contrast (OMNIPAQUE 350)
Comparison: CT 04/19/2019, x-ray 11/28/2019

CLINICAL DATA: Sickle cell pain

EXAM:
CT ANGIOGRAPHY CHEST WITH CONTRAST
TECHNIQUE: Multidetector CT imaging of the chest was performed using the
standard protocol during bolus administration of intravenous
contrast. Multiplanar CT image reconstructions and MIPs were
obtained to evaluate the vascular anatomy.
CONTRAST:  100mL OMNIPAQUE IOHEXOL 350 MG/ML SOLN

[Series 5: thins · axial · 0.77mm/px · z∈[-306,-84]mm · 17 of 250 slices shown]
[im 14/250  lung]
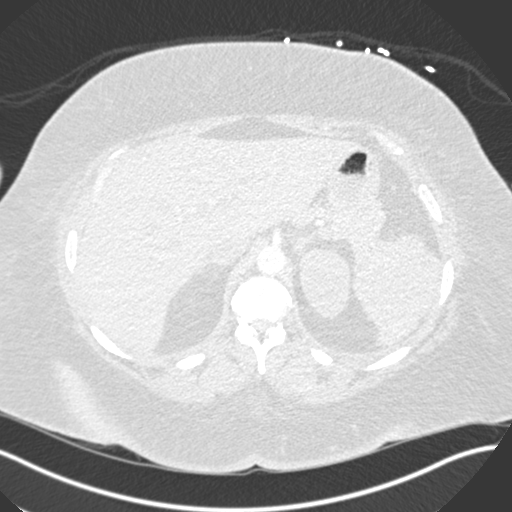
[im 28/250  mediastinal]
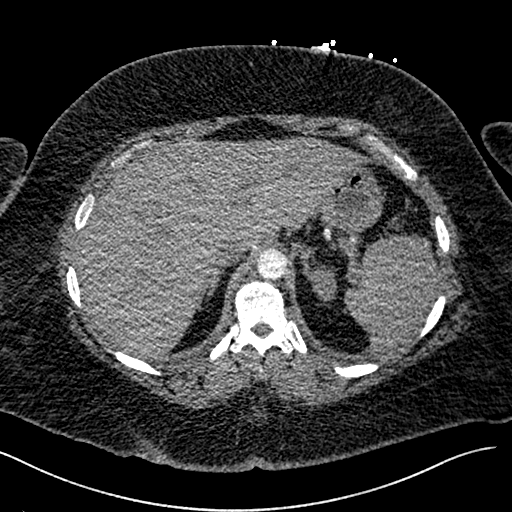
[im 42/250  lung]
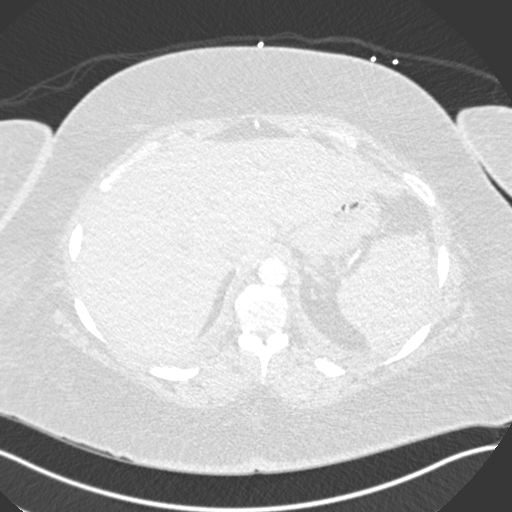
[im 56/250  mediastinal]
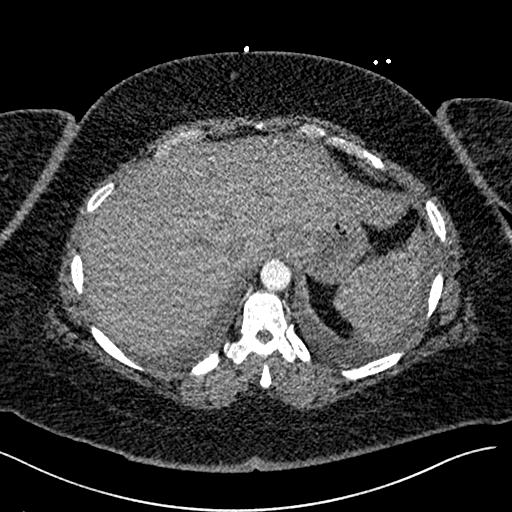
[im 70/250  lung]
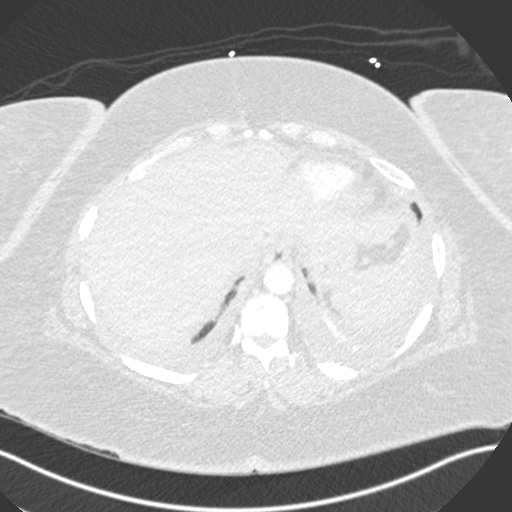
[im 84/250  mediastinal]
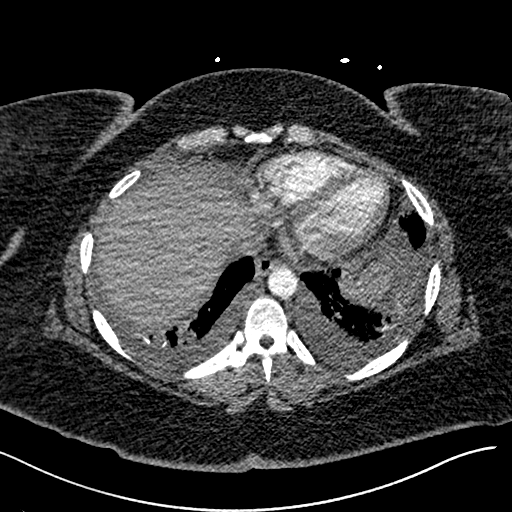
[im 97/250  lung]
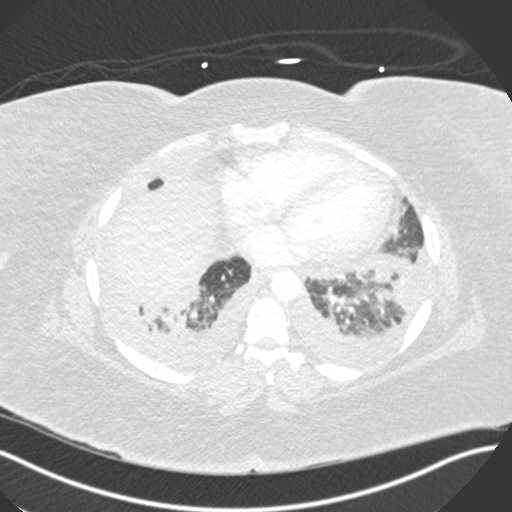
[im 111/250  mediastinal]
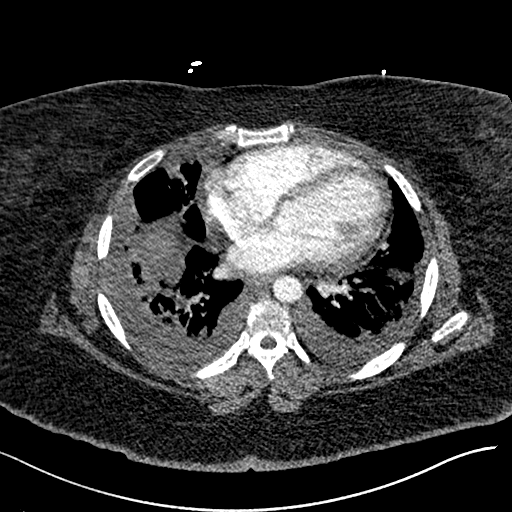
[im 125/250  lung]
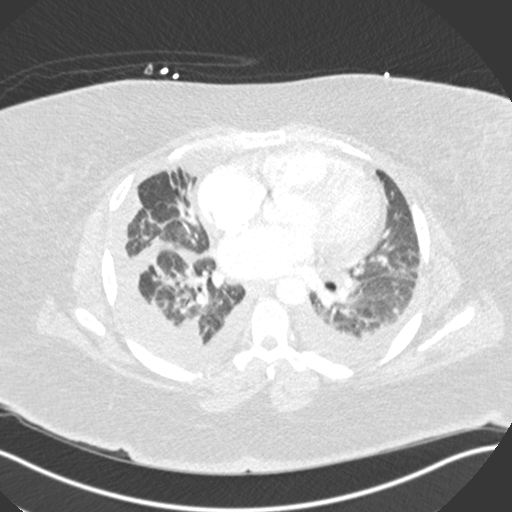
[im 139/250  mediastinal]
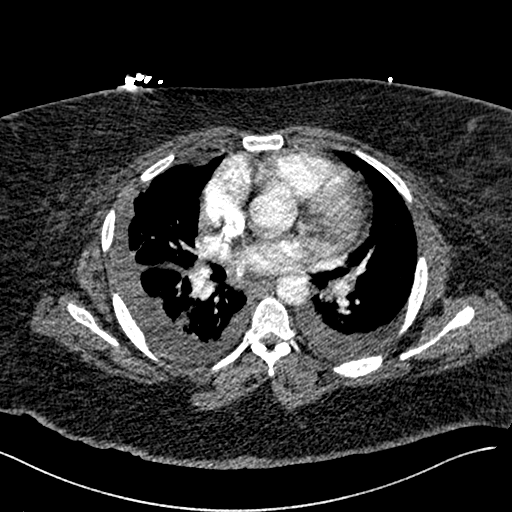
[im 153/250  lung]
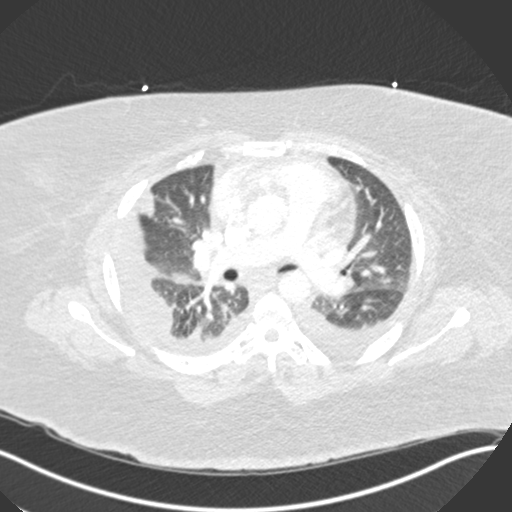
[im 167/250  mediastinal]
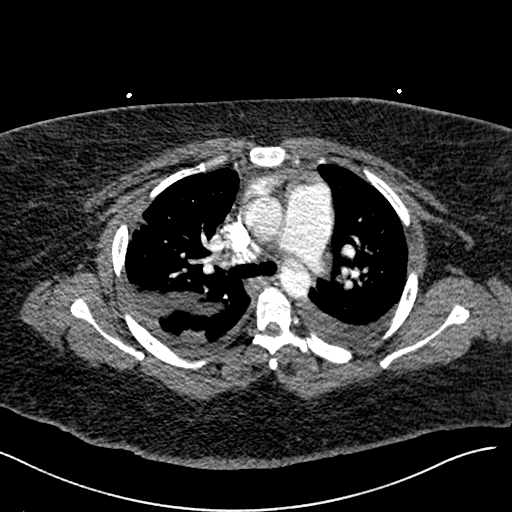
[im 180/250  lung]
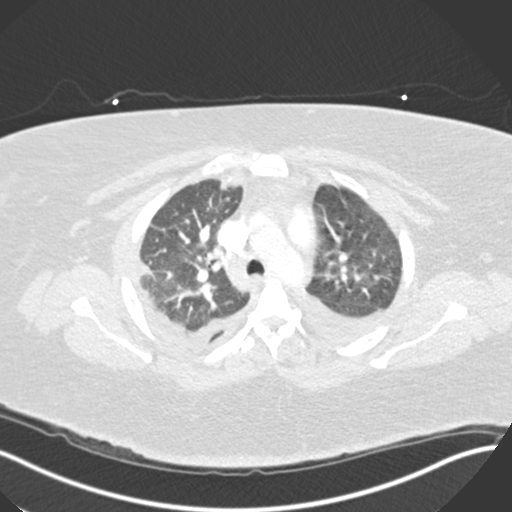
[im 194/250  mediastinal]
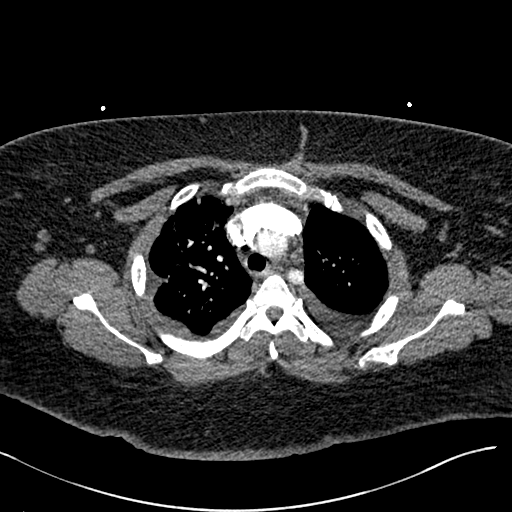
[im 208/250  lung]
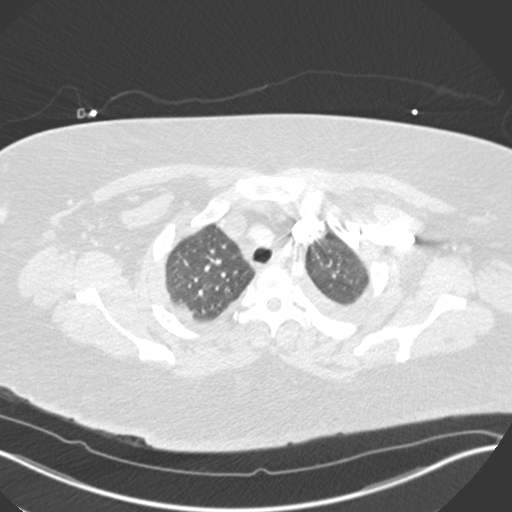
[im 222/250  mediastinal]
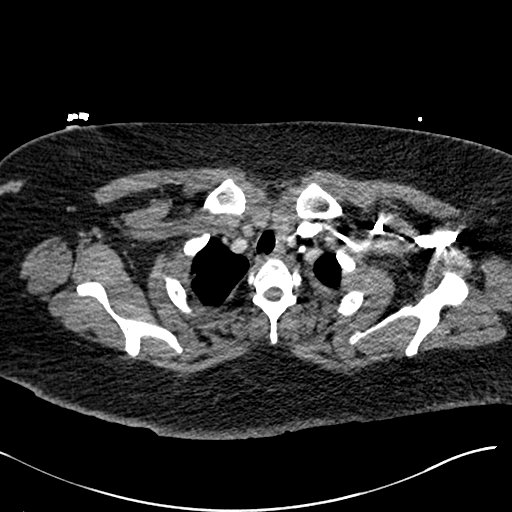
[im 236/250  lung]
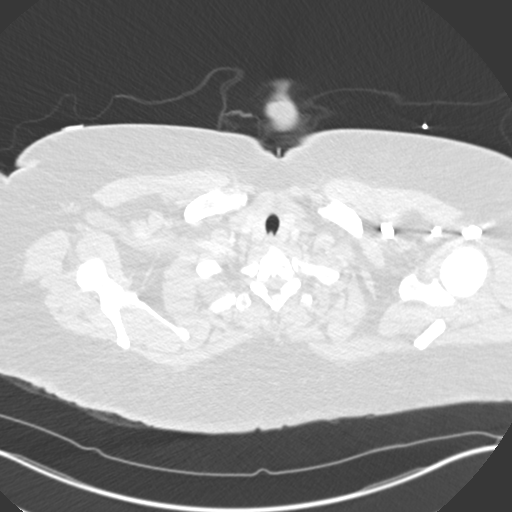

[17 of 36 positions shown; findings below may reference images not displayed]

FINDINGS: Cardiovascular: Evaluation of the pulmonary arterial tree is limited
by contrast bolus timing and respiratory motion artifact. There is
no evidence of filling defect to the lobar branch level to suggest
acute pulmonary embolism. Main pulmonary trunk measures 3.5 cm in
diameter suggesting component of pulmonary arterial hypertension.
Normal RV to LV ratio without evidence of acute right heart strain.
Heart size is mildly enlarged. No significant pericardial effusion.
Thoracic aorta is nonaneurysmal.

Mediastinum/Nodes: No enlarged mediastinal, hilar, or axillary lymph
nodes. Thyroid gland, trachea, and esophagus demonstrate no
significant findings.

Lungs/Pleura: Small to moderate right-sided pleural effusion which
appears partially loculated along the lateral aspect of the
hemithorax. Small left-sided pleural effusion. There are patchy
dependent airspace consolidations, right worse than left. Previously
described right middle lobe pulmonary nodule is not visualized on
today's exam although there is significant atelectasis in the
adjacent lung. No pneumothorax.

Upper Abdomen: Heterogeneous appearance of the visualized spleen is
similar compared to the prior study.

Musculoskeletal: Findings suggestive of avascular necrosis of the
bilateral humeral heads, unchanged. No new or acute osseous
findings.

Review of the MIP images confirms the above findings.
IMPRESSION: 1. Slightly limited exam. No evidence of acute pulmonary embolism to
the lobar branch level.
2. Small to moderate right-sided pleural effusion which appears
partially loculated along the lateral aspect of the hemithorax.
3. Small left-sided pleural effusion.
4. Patchy dependent airspace consolidations, right worse than left,
which may represent atelectasis and/or pneumonia.
5. Findings suggestive of avascular necrosis of the bilateral
humeral heads, unchanged.

## 2022-12-02 ENCOUNTER — Telehealth (HOSPITAL_COMMUNITY): Payer: Self-pay

## 2022-12-02 ENCOUNTER — Non-Acute Institutional Stay (HOSPITAL_COMMUNITY)
Admission: AD | Admit: 2022-12-02 | Discharge: 2022-12-02 | Disposition: A | Discharge status: Home / Self Care | Payer: Medicare Other | Source: Ambulatory Visit | Attending: Internal Medicine | Admitting: Internal Medicine

## 2022-12-02 DIAGNOSIS — Z79899 Other long term (current) drug therapy: Secondary | ICD-10-CM | POA: Insufficient documentation

## 2022-12-02 DIAGNOSIS — G894 Chronic pain syndrome: Secondary | ICD-10-CM | POA: Insufficient documentation

## 2022-12-02 DIAGNOSIS — D638 Anemia in other chronic diseases classified elsewhere: Secondary | ICD-10-CM | POA: Diagnosis not present

## 2022-12-02 DIAGNOSIS — Z833 Family history of diabetes mellitus: Secondary | ICD-10-CM | POA: Diagnosis not present

## 2022-12-02 DIAGNOSIS — D5700 Hb-SS disease with crisis, unspecified: Secondary | ICD-10-CM | POA: Diagnosis present

## 2022-12-02 LAB — CBC WITH DIFFERENTIAL/PLATELET
Abs Immature Granulocytes: 0.02 10*3/uL (ref 0.00–0.07)
Basophils Absolute: 0 10*3/uL (ref 0.0–0.1)
Basophils Relative: 1 %
Eosinophils Absolute: 0.2 10*3/uL (ref 0.0–0.5)
Eosinophils Relative: 2 %
HCT: 34.1 % — ABNORMAL LOW (ref 36.0–46.0)
Hemoglobin: 11.3 g/dL — ABNORMAL LOW (ref 12.0–15.0)
Immature Granulocytes: 0 %
Lymphocytes Relative: 25 %
Lymphs Abs: 1.9 10*3/uL (ref 0.7–4.0)
MCH: 23.7 pg — ABNORMAL LOW (ref 26.0–34.0)
MCHC: 33.1 g/dL (ref 30.0–36.0)
MCV: 71.6 fL — ABNORMAL LOW (ref 80.0–100.0)
Monocytes Absolute: 0.7 10*3/uL (ref 0.1–1.0)
Monocytes Relative: 9 %
Neutro Abs: 4.7 10*3/uL (ref 1.7–7.7)
Neutrophils Relative %: 63 %
Platelets: 267 10*3/uL (ref 150–400)
RBC: 4.76 MIL/uL (ref 3.87–5.11)
RDW: 15.3 % (ref 11.5–15.5)
WBC: 7.5 10*3/uL (ref 4.0–10.5)
nRBC: 0.8 % — ABNORMAL HIGH (ref 0.0–0.2)

## 2022-12-02 LAB — COMPREHENSIVE METABOLIC PANEL
ALT: 16 U/L (ref 0–44)
AST: 14 U/L — ABNORMAL LOW (ref 15–41)
Albumin: 3.8 g/dL (ref 3.5–5.0)
Alkaline Phosphatase: 79 U/L (ref 38–126)
Anion gap: 4 — ABNORMAL LOW (ref 5–15)
BUN: 7 mg/dL (ref 6–20)
CO2: 29 mmol/L (ref 22–32)
Calcium: 8.8 mg/dL — ABNORMAL LOW (ref 8.9–10.3)
Chloride: 105 mmol/L (ref 98–111)
Creatinine, Ser: 0.64 mg/dL (ref 0.44–1.00)
GFR, Estimated: >60 mL/min (ref >60)
Glucose, Bld: 102 mg/dL — ABNORMAL HIGH (ref 70–99)
Potassium: 4.6 mmol/L (ref 3.5–5.1)
Sodium: 138 mmol/L (ref 135–145)
Total Bilirubin: 0.9 mg/dL (ref 0.3–1.2)
Total Protein: 7.6 g/dL (ref 6.5–8.1)

## 2022-12-02 LAB — LACTATE DEHYDROGENASE: LDH: 133 U/L (ref 98–192)

## 2022-12-02 MED ORDER — ONDANSETRON HCL 4 MG/2ML IJ SOLN
4.0000 mg | Freq: Four times a day (QID) | INTRAMUSCULAR | Status: DC | PRN
  Administered 2022-12-02: 4 mg via INTRAVENOUS
  Filled 2022-12-02: qty 2

## 2022-12-02 MED ORDER — DIPHENHYDRAMINE HCL 25 MG PO CAPS
25.0000 mg | ORAL_CAPSULE | ORAL | Status: DC | PRN
  Administered 2022-12-02: 25 mg via ORAL
  Filled 2022-12-02: qty 1

## 2022-12-02 MED ORDER — DEXTROSE-SODIUM CHLORIDE 5-0.45 % IV SOLN: INTRAVENOUS | Status: DC

## 2022-12-02 MED ORDER — SENNOSIDES-DOCUSATE SODIUM 8.6-50 MG PO TABS
1.0000 | ORAL_TABLET | Freq: Two times a day (BID) | ORAL | Status: DC
Start: 2022-12-02 — End: 2022-12-02

## 2022-12-02 MED ORDER — NALOXONE HCL 0.4 MG/ML IJ SOLN: 0.4000 mg | INTRAMUSCULAR | Status: DC | PRN

## 2022-12-02 MED ORDER — POLYETHYLENE GLYCOL 3350 17 G PO PACK: 17.0000 g | PACK | Freq: Every day | ORAL | Status: DC | PRN

## 2022-12-02 MED ORDER — SODIUM CHLORIDE 0.9% FLUSH: 9.0000 mL | INTRAVENOUS | Status: DC | PRN

## 2022-12-02 MED ORDER — HYDROMORPHONE 1 MG/ML IV SOLN
INTRAVENOUS | Status: DC
  Administered 2022-12-02: 30 mg via INTRAVENOUS
  Administered 2022-12-02: 12.5 mg via INTRAVENOUS
  Filled 2022-12-02: qty 30

## 2022-12-02 NOTE — Progress Notes (Signed)
Pt admitted to the day hospital by Dr. Mikeal Hawthorne, for treatment of sickle cell pain crisis. Pt reported pain to bilateral legs rated  9/10. Pt given PO Benadryl , IV zofran, placed on Dilaudid PCA 0.5/10/3, and hydrated with D5 1/2NS IV fluids. At discharge patient reported pain a 7/10. Pt declined AVS. Pt alert , oriented and ambulatory at discharge.

## 2022-12-02 NOTE — Discharge Summary (Addendum)
Physician Discharge Summary   Patient: Stephanie Burch MRN: [id number] DOB: 1981-01-08  Admit date:     12/02/2022  Discharge date: 12/02/2022  Discharge Physician: Lonia Blood   PCP: Quentin Angst, MD   Recommendations at discharge:   Patient to resume home regimen.  Pain was down to 4 out of 10.  Discharge Diagnoses: Active Problems:   Sickle cell pain crisis (HCC)  Resolved Problems:   * No resolved hospital problems. [facility] Hospital Course: Patient was admitted to the sickle cell day hospital.  Treated with Dilaudid PCA, Toradol, IV fluids.  Pain was initially 7 out of 10 but has improved down to 4 out of 10.  She is discharged to follow-up with PCP and resume her home regimen.  Assessment and Plan: No notes have been filed under this hospital service. Service: Hospitalist        Consultants: None Procedures performed: None Disposition: Home Diet recommendation:  Discharge Diet Orders (From admission, onward)     Start     Ordered   12/02/22 0000  Diet - low sodium heart healthy        12/02/22 1529           Regular diet DISCHARGE MEDICATION: Allergies as of 12/02/2022   No Known Allergies      Medication List     TAKE these medications    acetaminophen 500 MG tablet Commonly known as: TYLENOL Take 1,000 mg by mouth every 6 (six) hours as needed for mild pain, moderate pain or headache.   folic acid 1 MG tablet Commonly known as: FOLVITE Take 1 mg by mouth daily.   HYDROmorphone 4 MG tablet Commonly known as: DILAUDID Take 4 mg by mouth every 6 (six) hours.   ibuprofen 200 MG tablet Commonly known as: ADVIL Take 800 mg by mouth 3 (three) times daily as needed for fever, headache or mild pain.   naloxone 4 MG/0.1ML Liqd nasal spray kit Commonly known as: NARCAN Use as needed for opiod overdose   ondansetron 4 MG tablet Commonly known as: ZOFRAN Take by mouth.   Oxbryta 500 MG Tabs tablet Generic drug: voxelotor Take 1,500 mg  by mouth daily in the afternoon.   oxycodone 30 MG immediate release tablet Commonly known as: ROXICODONE Take 30 mg by mouth every 4 (four) hours.   Vitamin D (Ergocalciferol) 1.25 MG (50000 UNIT) Caps capsule Commonly known as: DRISDOL Take 1 capsule (50,000 Units total) by mouth every 7 (seven) days.        Discharge Exam: There were no vitals filed for this visit. Constitutional: NAD, calm, comfortable Eyes: PERRL, lids and conjunctivae normal ENMT: Mucous membranes are moist. Posterior pharynx clear of any exudate or lesions.Normal dentition.  Neck: normal, supple, no masses, no thyromegaly Respiratory: clear to auscultation bilaterally, no wheezing, no crackles. Normal respiratory effort. No accessory muscle use.  Cardiovascular: Regular rate and rhythm, no murmurs / rubs / gallops. No extremity edema. 2+ pedal pulses. No carotid bruits.  Abdomen: no tenderness, no masses palpated. No hepatosplenomegaly. Bowel sounds positive.  Musculoskeletal: Good range of motion, no joint swelling or tenderness, Skin: no rashes, lesions, ulcers. No induration Neurologic: CN 2-12 grossly intact. Sensation intact, DTR normal. Strength 5/5 in all 4.  Psychiatric: Normal judgment and insight. Alert and oriented x 3. Normal mood   Condition at discharge: good  The results of significant diagnostics from this hospitalization (including imaging, microbiology, ancillary and laboratory) are listed below for reference.   Imaging Studies:  No results found.  Microbiology: Results for orders placed or performed during the hospital encounter of 11/21/21  SARS Coronavirus 2 by RT PCR (hospital order, performed in [facility] hospital lab) *cepheid single result test* Anterior Nasal Swab     Status: None   Collection Time: 11/21/21  2:43 PM   Specimen: Anterior Nasal Swab  Result Value Ref Range Status   SARS Coronavirus 2 by RT PCR NEGATIVE NEGATIVE Final    Comment: (NOTE) SARS-CoV-2 target  nucleic acids are NOT DETECTED.  The SARS-CoV-2 RNA is generally detectable in upper and lower respiratory specimens during the acute phase of infection. The lowest concentration of SARS-CoV-2 viral copies this assay can detect is 250 copies / mL. A negative result does not preclude SARS-CoV-2 infection and should not be used as the sole basis for treatment or other patient management decisions.  A negative result may occur with improper specimen collection / handling, submission of specimen other than nasopharyngeal swab, presence of viral mutation(s) within the areas targeted by this assay, and inadequate number of viral copies (<250 copies / mL). A negative result must be combined with clinical observations, patient history, and epidemiological information.  Fact Sheet for Patients:   RoadLapTop.co.za  Fact Sheet for Healthcare Providers: http://kim-miller.com/  This test is not yet approved or  cleared by the [place] FDA and has been authorized for detection and/or diagnosis of SARS-CoV-2 by FDA under an Emergency Use Authorization (EUA).  This EUA will remain in effect (meaning this test can be used) for the duration of the COVID-19 declaration under Section 564(b)(1) of the Act, 21 U.S.C. section 360bbb-3(b)(1), unless the authorization is terminated or revoked sooner.  Performed at [facility], [address], [place], [place] [address]     Labs: CBC: Recent Labs  Lab 12/02/22 1000  WBC 7.5  NEUTROABS 4.7  HGB 11.3*  HCT 34.1*  MCV 71.6*  PLT 267   Basic Metabolic Panel: Recent Labs  Lab 12/02/22 1330  NA 138  K 4.6  CL 105  CO2 29  GLUCOSE 102*  BUN 7  CREATININE 0.64  CALCIUM 8.8*   Liver Function Tests: Recent Labs  Lab 12/02/22 1330  AST 14*  ALT 16  ALKPHOS 79  BILITOT 0.9  PROT 7.6  ALBUMIN 3.8   CBG: No results for input(s): "GLUCAP" in the last 168  hours.  Discharge time spent: less than 30 minutes.  SignedLonia Blood, MD [facility] 12/02/2022

## 2022-12-02 NOTE — H&P (Signed)
History and Physical    Patient: Stephanie Burch DOB: [birth year] DOA: 12/02/2022 DOS: the patient was seen and examined on 12/02/2022 PCP: Quentin Angst, MD  Patient coming from: Home  Chief Complaint: No chief complaint on file.  HPI: Stephanie Burch is a 42 y.o. female with medical history significant of sickle cell disease, chronic pain syndrome, sickle cell anemia, history of blood transfusion who presented to the sickle cell day hospital with pain in her back and legs consistent with sickle cell pain crisis.   Review of Systems: As mentioned in the history of present illness. All other systems reviewed and are negative. Past Medical History:  Diagnosis Date   Acute kidney injury (HCC) 11/07/2019   Anemia    Chronic pain    COVID-19 virus detected 10/22/2019   Depression    HCAP (healthcare-associated pneumonia) 11/07/2019   History of blood transfusion 2013   x 2   HSV infection    Hypoxia    Pneumonia    x 1   Pulmonary edema 11/11/2019   Sepsis (HCC) 11/07/2019   Sickle cell anemia (HCC) 09/2021   gets them frequently   Tachycardia with heart rate 100-120 beats per minute    Vitamin B12 deficiency 12/2018   Vitamin D deficiency    Past Surgical History:  Procedure Laterality Date   CESAREAN SECTION  2013   x 1   TOOTH EXTRACTION N/A 10/18/2021   Procedure: DENTAL RESTORATION/EXTRACTIONS;  Surgeon: Ocie Doyne, DMD;  Location: [facility] OR;  Service: Oral Surgery;  Laterality: N/A;   Social History:  reports that she has never smoked. She has never used smokeless tobacco. She reports that she does not currently use alcohol. She reports that she does not use drugs.  No Known Allergies  Family History  Problem Relation Age of Onset   Hypertension Mother    Sickle cell trait Mother    Diabetes Father    Sickle cell trait Father     Prior to Admission medications   Medication Sig Start Date End Date Taking? Authorizing Provider  acetaminophen  (TYLENOL) 500 MG tablet Take 1,000 mg by mouth every 6 (six) hours as needed for mild pain, moderate pain or headache.    [provider]  folic acid (FOLVITE) 1 MG tablet Take 1 mg by mouth daily.    [provider]  HYDROmorphone (DILAUDID) 4 MG tablet Take 4 mg by mouth every 6 (six) hours.    [provider]  ibuprofen (ADVIL) 200 MG tablet Take 800 mg by mouth 3 (three) times daily as needed for fever, headache or mild pain.    [provider]  naloxone [facility]) nasal spray 4 mg/0.1 mL Use as needed for opiod overdose 03/04/22   Donell Beers, FNP  ondansetron (ZOFRAN) 4 MG tablet Take by mouth. 02/28/22   [provider]  OXBRYTA 500 MG TABS tablet Take 1,500 mg by mouth daily in the afternoon. 10/04/21   [provider]  oxycodone (ROXICODONE) 30 MG immediate release tablet Take 30 mg by mouth every 4 (four) hours.    [provider]  Vitamin D, Ergocalciferol, (DRISDOL) 1.25 MG (50000 UNIT) CAPS capsule Take 1 capsule (50,000 Units total) by mouth every 7 (seven) days. 03/04/22   Donell Beers, FNP    Physical Exam: There were no vitals filed for this visit. Constitutional: NAD, calm, comfortable Eyes: PERRL, lids and conjunctivae normal ENMT: Mucous membranes are moist. Posterior pharynx clear of any  exudate or lesions.Normal dentition.  Neck: normal, supple, no masses, no thyromegaly Respiratory: clear to auscultation bilaterally, no wheezing, no crackles. Normal respiratory effort. No accessory muscle use.  Cardiovascular: Regular rate and rhythm, no murmurs / rubs / gallops. No extremity edema. 2+ pedal pulses. No carotid bruits.  Abdomen: no tenderness, no masses palpated. No hepatosplenomegaly. Bowel sounds positive.  Musculoskeletal: Good range of motion, no joint swelling or tenderness, Skin: no rashes, lesions, ulcers. No induration Neurologic: CN 2-12 grossly intact. Sensation intact, DTR normal. Strength  5/5 in all 4.  Psychiatric: Normal judgment and insight. Alert and oriented x 3. Normal mood  Data Reviewed:  There are no new results to review at this time.  Assessment and Plan:  #1 sickle cell pain crisis: Patient will be admitted to the sickle cell day hospital.  Initiate IV Dilaudid PCA, Toradol, IV fluids.  She will be on D5 half-normal at 40 cc an hour.  Assess pain.  Adjust medications.  #2 anemia of chronic disease: Monitor H&H.  #3 chronic pain syndrome: Continue chronic pain medications    Advance Care Planning:   Code Status: Prior full code  Consults: None  Family Communication: No family at bedside  Severity of Illness: The appropriate patient status for this patient is OBSERVATION. Observation status is judged to be reasonable and necessary in order to provide the required intensity of service to ensure the patient's safety. The patient's presenting symptoms, physical exam findings, and initial radiographic and laboratory data in the context of their medical condition is felt to place them at decreased risk for further clinical deterioration. Furthermore, it is anticipated that the patient will be medically stable for discharge from the hospital within 2 midnights of admission.   AuthorLonia Blood, MD 12/02/2022 9:51 AM  For on call review [email or web address].

## 2022-12-02 NOTE — Hospital Course (Signed)
Patient was admitted to the sickle cell day hospital.  Treated with Dilaudid PCA, Toradol, IV fluids.  Pain was initially 7 out of 10 but has improved down to 4 out of 10.  She is discharged to follow-up with PCP and resume her home regimen.

## 2022-12-02 NOTE — Telephone Encounter (Signed)
Patient called in. Complains of pain in legs bilateral rates 9/10. Denied chest pain, abd pain, fever, N/V/D. Wants to come in for treatment. Last took 30mg  Oxycodone and Methadone at 4am. Pt states she was in the ED 2 weeks ago. Pt denies recent covid exposure or flu like symptoms Pt states her friend Ines Bloomer is her transportation to and from center today. Dr. Mikeal Hawthorne notified,  approved for pt to be seen in the day hospital today. Pt made aware, verbalized understanding.

## 2022-12-16 ENCOUNTER — Encounter: Payer: Self-pay | Admitting: Nurse Practitioner

## 2022-12-16 ENCOUNTER — Ambulatory Visit (INDEPENDENT_AMBULATORY_CARE_PROVIDER_SITE_OTHER): Payer: Medicare Other | Admitting: Nurse Practitioner

## 2022-12-16 VITALS — BP 135/81 | HR 85 | Wt 347.4 lb

## 2022-12-16 DIAGNOSIS — F331 Major depressive disorder, recurrent, moderate: Secondary | ICD-10-CM

## 2022-12-16 DIAGNOSIS — G894 Chronic pain syndrome: Secondary | ICD-10-CM | POA: Diagnosis not present

## 2022-12-16 DIAGNOSIS — F32A Depression, unspecified: Secondary | ICD-10-CM | POA: Insufficient documentation

## 2022-12-16 DIAGNOSIS — D571 Sickle-cell disease without crisis: Secondary | ICD-10-CM | POA: Diagnosis not present

## 2022-12-16 NOTE — Assessment & Plan Note (Signed)
Flowsheet Row Office Visit from 12/16/2022 in [facility] A Dept Of [facility]  PHQ-9 Total Score 14     Patient denies SI, HI Will follow-up on this at next visit
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
## 2022-12-16 NOTE — Patient Instructions (Signed)
It was nice seeing you today.  I will get back to you about your pain management as soon as I come up with a plan.  I have referred you to the hematologist at [facility] as you requested    It is important that you exercise regularly at least 30 minutes 5 times a week as tolerated  Think about what you will eat, plan ahead. Choose " clean, green, fresh or frozen" over canned, processed or packaged foods which are more sugary, salty and fatty. 70 to 75% of food eaten should be vegetables and fruit. Three meals at set times with snacks allowed between meals, but they must be fruit or vegetables. Aim to eat over a 12 hour period , example 7 am to 7 pm, and STOP after  your last meal of the day. Drink water,generally about 64 ounces per day, no other drink is as healthy. Fruit juice is best enjoyed in a healthy way, by EATING the fruit.  Thanks for choosing [facility] we consider it a privelige to serve you.

## 2022-12-16 NOTE — Assessment & Plan Note (Signed)
I have consulted Dr. Hyman Hopes about this patient's pain management.  He advised to start methadone 10 mg twice daily and oxycodone 20 mg every 4 hours as needed for moderate pain. medications not due for refills until 12/22/2022.   Will continue ibuprofen 800 mg 3 times daily as needed for mild pain Will be updated with this plan and we will follow-up monthly with her in the office. I have discussed the need to keep medications in a safe and secured cabinet patient verbalized understanding Will have the patient come into the office to sign a pain management agreement form and follow-up with her monthly in the office

## 2022-12-16 NOTE — Assessment & Plan Note (Signed)
Flowsheet Row Office Visit from 12/16/2022 in [facility]  PHQ-9 Total Score 14     Will follow-up on this and discuss treatment with medication or referral to therapist if needed Patient denies SI, HI

## 2022-12-16 NOTE — Assessment & Plan Note (Addendum)
Current daily MME is 401.3, this is higher than recommended I have consulted Dr. Hyman Hopes about this patient's pain management.  He advised to start methadone 10 mg twice daily and oxycodone 20 mg every 4 hours as needed for moderate pain. medications not due for refills until 12/22/2022.   Will continue ibuprofen 800 mg 3 times daily as needed for mild pain Will be updated with this plan and we will follow-up monthly with her in the office. I have discussed the need to keep medications in a safe and secured cabinet patient verbalized understanding Will have the patient come into the office to sign a pain management agreement form and follow-up with her monthly in the office Will also have the patient schedule an appointment for Adakveo infusion Continue folic acid 1 mg daily Patient has been referred to a new hematologist

## 2022-12-17 ENCOUNTER — Telehealth: Payer: Self-pay

## 2022-12-17 ENCOUNTER — Other Ambulatory Visit: Payer: Self-pay | Admitting: Nurse Practitioner

## 2022-12-17 DIAGNOSIS — G894 Chronic pain syndrome: Secondary | ICD-10-CM

## 2022-12-17 DIAGNOSIS — D571 Sickle-cell disease without crisis: Secondary | ICD-10-CM

## 2022-12-17 NOTE — Telephone Encounter (Signed)
-----   Message from [place] sent at 12/16/2022  4:51 PM EST ----- Regarding: Opioid management I have consulted Dr. Hyman Hopes about this patient's pain management.  He advised to start methadone 10 mg twice daily and oxycodone 20 mg every 4 hours as needed for moderate pain. medications not due for refills until 12/22/2022.   Will continue ibuprofen 800 mg 3 times daily as needed for mild pain  Please have the patient come into the office to sign a pain management agreement form and follow-up with her monthly in the office.  We will need to obtain a urine drug screen when she comes in as well Please schedule an appointment for urine drug screen and for the patient to complete the opioid prescription agreement form. Please have this patient schedule an appointment for Adakveo infusion  Thank you

## 2022-12-17 NOTE — Telephone Encounter (Signed)
Pt was advised and she  wanted to know when she needs to come and have done. KH

## 2022-12-18 ENCOUNTER — Telehealth (HOSPITAL_COMMUNITY): Payer: Self-pay

## 2022-12-18 ENCOUNTER — Non-Acute Institutional Stay (HOSPITAL_COMMUNITY)
Admission: AD | Admit: 2022-12-18 | Discharge: 2022-12-18 | Disposition: A | Discharge status: Home / Self Care | Payer: Medicare Other | Source: Ambulatory Visit | Attending: Internal Medicine | Admitting: Internal Medicine

## 2022-12-18 DIAGNOSIS — D5700 Hb-SS disease with crisis, unspecified: Secondary | ICD-10-CM | POA: Diagnosis present

## 2022-12-18 DIAGNOSIS — F1120 Opioid dependence, uncomplicated: Secondary | ICD-10-CM | POA: Insufficient documentation

## 2022-12-18 DIAGNOSIS — G894 Chronic pain syndrome: Secondary | ICD-10-CM | POA: Diagnosis not present

## 2022-12-18 DIAGNOSIS — D638 Anemia in other chronic diseases classified elsewhere: Secondary | ICD-10-CM | POA: Diagnosis not present

## 2022-12-18 DIAGNOSIS — E669 Obesity, unspecified: Secondary | ICD-10-CM | POA: Insufficient documentation

## 2022-12-18 LAB — COMPREHENSIVE METABOLIC PANEL
ALT: 15 U/L (ref 0–44)
AST: 14 U/L — ABNORMAL LOW (ref 15–41)
Albumin: 3.7 g/dL (ref 3.5–5.0)
Alkaline Phosphatase: 82 U/L (ref 38–126)
Anion gap: 8 (ref 5–15)
BUN: 6 mg/dL (ref 6–20)
CO2: 28 mmol/L (ref 22–32)
Calcium: 9.4 mg/dL (ref 8.9–10.3)
Chloride: 101 mmol/L (ref 98–111)
Creatinine, Ser: 0.31 mg/dL — ABNORMAL LOW (ref 0.44–1.00)
GFR, Estimated: >60 mL/min (ref >60)
Glucose, Bld: 93 mg/dL (ref 70–99)
Potassium: 4.1 mmol/L (ref 3.5–5.1)
Sodium: 137 mmol/L (ref 135–145)
Total Bilirubin: 0.9 mg/dL (ref <1.2)
Total Protein: 7.8 g/dL (ref 6.5–8.1)

## 2022-12-18 LAB — RAPID URINE DRUG SCREEN, HOSP PERFORMED
Amphetamines: NOT DETECTED
Barbiturates: NOT DETECTED
Benzodiazepines: NOT DETECTED
Cocaine: NOT DETECTED
Opiates: NOT DETECTED
Tetrahydrocannabinol: NOT DETECTED

## 2022-12-18 LAB — CBC WITH DIFFERENTIAL/PLATELET
Abs Immature Granulocytes: 0.03 10*3/uL (ref 0.00–0.07)
Basophils Absolute: 0 10*3/uL (ref 0.0–0.1)
Basophils Relative: 1 %
Eosinophils Absolute: 0.2 10*3/uL (ref 0.0–0.5)
Eosinophils Relative: 3 %
HCT: 34.6 % — ABNORMAL LOW (ref 36.0–46.0)
Hemoglobin: 11.6 g/dL — ABNORMAL LOW (ref 12.0–15.0)
Immature Granulocytes: 0 %
Lymphocytes Relative: 24 %
Lymphs Abs: 2 10*3/uL (ref 0.7–4.0)
MCH: 24 pg — ABNORMAL LOW (ref 26.0–34.0)
MCHC: 33.5 g/dL (ref 30.0–36.0)
MCV: 71.5 fL — ABNORMAL LOW (ref 80.0–100.0)
Monocytes Absolute: 0.7 10*3/uL (ref 0.1–1.0)
Monocytes Relative: 8 %
Neutro Abs: 5.5 10*3/uL (ref 1.7–7.7)
Neutrophils Relative %: 64 %
Platelets: 278 10*3/uL (ref 150–400)
RBC: 4.84 MIL/uL (ref 3.87–5.11)
RDW: 14.8 % (ref 11.5–15.5)
WBC: 8.5 10*3/uL (ref 4.0–10.5)
nRBC: 1.1 % — ABNORMAL HIGH (ref 0.0–0.2)

## 2022-12-18 LAB — RETICULOCYTES
Immature Retic Fract: 34.1 % — ABNORMAL HIGH (ref 2.3–15.9)
RBC.: 4.84 MIL/uL (ref 3.87–5.11)
Retic Count, Absolute: 199.9 10*3/uL — ABNORMAL HIGH (ref 19.0–186.0)
Retic Ct Pct: 4.1 % — ABNORMAL HIGH (ref 0.4–3.1)

## 2022-12-18 MED ORDER — HYDROMORPHONE 1 MG/ML IV SOLN
INTRAVENOUS | Status: DC
  Administered 2022-12-18: 13 mg via INTRAVENOUS
  Administered 2022-12-18: 30 mg via INTRAVENOUS
  Filled 2022-12-18: qty 30

## 2022-12-18 MED ORDER — KETOROLAC TROMETHAMINE 30 MG/ML IJ SOLN
15.0000 mg | Freq: Once | INTRAMUSCULAR | Status: AC
  Administered 2022-12-18: 15 mg via INTRAVENOUS
  Filled 2022-12-18: qty 1

## 2022-12-18 MED ORDER — SODIUM CHLORIDE 0.9% FLUSH: 9.0000 mL | INTRAVENOUS | Status: DC | PRN

## 2022-12-18 MED ORDER — ACETAMINOPHEN 500 MG PO TABS
1000.0000 mg | ORAL_TABLET | Freq: Once | ORAL | Status: AC
  Administered 2022-12-18: 1000 mg via ORAL
  Filled 2022-12-18: qty 2

## 2022-12-18 MED ORDER — NALOXONE HCL 0.4 MG/ML IJ SOLN: 0.4000 mg | INTRAMUSCULAR | Status: DC | PRN

## 2022-12-18 MED ORDER — DIPHENHYDRAMINE HCL 25 MG PO CAPS
25.0000 mg | ORAL_CAPSULE | ORAL | Status: DC | PRN
  Filled 2022-12-18: qty 1

## 2022-12-18 MED ORDER — ONDANSETRON HCL 4 MG/2ML IJ SOLN
4.0000 mg | Freq: Four times a day (QID) | INTRAMUSCULAR | Status: DC | PRN
  Filled 2022-12-18: qty 2

## 2022-12-18 NOTE — Telephone Encounter (Signed)
Forms given and checking with [place] to see if we can have drug screen order placed while pt is in the day hospital. [facility]

## 2022-12-18 NOTE — Telephone Encounter (Signed)
Pt called day hospital wanting to come in today for sickle cell pain treatment. Pt reports 8/10 pain to her legs and left arm. Pt reports taking Oxycodone 30 mg and Methadone 10 mg at 6 AM. Pt denies being to the ER recently. Pt screened for COVID and denies symptoms and exposures. Pt denies fever, chest pain, N/V/D, abdominal pain. [place] Hollis, FNP notified and per provider, pt can come to the day hospital today. Pt notified and verbalized understanding. Pt reports that her friend will be her transportation.

## 2022-12-18 NOTE — Progress Notes (Addendum)
Stephanie Burch admitted to the day hospital for sickle cell pain. Stephanie Burch initially reported bilateral arm and leg pain rated 8/10. For pain management, Stephanie Burch placed on Sickle  Cell Dose Dilaudid PCA, given IV Toradol, Tylenol and hydrated with IV fluids. At discharge, Stephanie Burch rated pain at 5/10. Vital signs stable.  AVS offered but Stephanie Burch refused. Stephanie Burch alert, oriented and ambulatory at discharge.

## 2022-12-18 NOTE — H&P (Signed)
[facility] History and Physical   Date: 12/18/2022  Patient name: Stephanie Burch Medical record number: [id number] Date of birth: 03/06/1980 Age: 42 y.o. Gender: female PCP: Quentin Angst, MD  Attending physician: Quentin Angst, MD  Chief Complaint: Sickle cell pain  History of Present Illness: Kiarrah Depriest is a 42 year old female with a medical history significant for sickle cell disease, chronic pain syndrome, opiate dependence and tolerance, obesity, and anemia of chronic disease presents with complaints of generalized pain that is consistent with her typical pain crisis. Patient says that pain intensity has been elevated over the past several days and unrelieved by her home medications. Patient last had oxycodone and methadone this am without sustained relief. Patient rates pain as 10/10, constant and throbbing. Patient denies fever, chills, chest pain, shortness of breath, nausea, vomiting, or diarrhea. No sick contacts or recent travel. She attributes pain to recent medication adjustments and changes in weather.   Meds: Medications Prior to Admission  Medication Sig Dispense Refill Last Dose   acetaminophen (TYLENOL) 500 MG tablet Take 1,000 mg by mouth every 6 (six) hours as needed for mild pain, moderate pain or headache.      folic acid (FOLVITE) 1 MG tablet Take 1 mg by mouth daily.      ibuprofen (ADVIL) 200 MG tablet Take 800 mg by mouth 3 (three) times daily as needed for fever, headache or mild pain.      naloxone (NARCAN) nasal spray 4 mg/0.1 mL Use as needed for opiod overdose 1 each 2    ondansetron (ZOFRAN) 4 MG tablet Take by mouth.      oxycodone (ROXICODONE) 30 MG immediate release tablet Take 30 mg by mouth every 4 (four) hours.      Vitamin D, Ergocalciferol, (DRISDOL) 1.25 MG (50000 UNIT) CAPS capsule Take 1 capsule (50,000 Units total) by mouth every 7 (seven) days. 8 capsule 0     Allergies: Patient has no known allergies. Past  Medical History:  Diagnosis Date   Acute kidney injury (HCC) 11/07/2019   Anemia    Chronic pain    COVID-19 virus detected 10/22/2019   Depression    HCAP (healthcare-associated pneumonia) 11/07/2019   History of blood transfusion 2013   x 2   HSV infection    Hypoxia    Pneumonia    x 1   Pulmonary edema 11/11/2019   Sepsis (HCC) 11/07/2019   Sickle cell anemia (HCC) 09/2021   gets them frequently   Tachycardia with heart rate 100-120 beats per minute    Vitamin B12 deficiency 12/2018   Vitamin D deficiency    Past Surgical History:  Procedure Laterality Date   CESAREAN SECTION  2013   x 1   TOOTH EXTRACTION N/A 10/18/2021   Procedure: DENTAL RESTORATION/EXTRACTIONS;  Surgeon: Ocie Doyne, DMD;  Location: [facility] OR;  Service: Oral Surgery;  Laterality: N/A;   Family History  Problem Relation Age of Onset   Hypertension Mother    Sickle cell trait Mother    Diabetes Father    Sickle cell trait Father    Social History   Socioeconomic History   Marital status: Single    Spouse name: Not on file   Number of children: Not on file   Years of education: Not on file   Highest education level: Not on file  Occupational History   Not on file  Tobacco Use   Smoking status: Never   Smokeless tobacco: Never  Vaping Use   Vaping status: Never Used  Substance and Sexual Activity   Alcohol use: Not Currently   Drug use: Never   Sexual activity: Yes    Birth control/protection: None  Other Topics Concern   Not on file  Social History Narrative   Not on file   Social Determinants of Health   Financial Resource Strain: Not on file  Food Insecurity: Low Risk  (12/04/2022)   Received from [facility]   Hunger Vital Sign    Worried About Running Out of Food in the Last Year: Never true    Ran Out of Food in the Last Year: Never true  Transportation Needs: No Transportation Needs (12/04/2022)   Received from [facility]    In the past 12 months, has  lack of reliable transportation kept you from medical appointments, meetings, work or from getting things needed for daily living? : No  Physical Activity: Not on file  Stress: Not on file  Social Connections: Not on file  Intimate Partner Violence: Low Risk  (03/27/2022)   Received from [facility] visits prior to 04/05/2022., [facility] visits prior to 04/05/2022.   Safety    How often does anyone, including family and friends, physically hurt you?: Never    How often does anyone, including family and friends, insult or talk down to you?: Never    How often does anyone, including family and friends, threaten you with harm?: Never    How often does anyone, including family and friends, scream or curse at you?: Never   Review of Systems  Constitutional: Negative.  Negative for chills and fever.  HENT: Negative.    Respiratory: Negative.    Gastrointestinal: Negative.   Genitourinary: Negative.   Musculoskeletal:  Positive for back pain and joint pain.  Skin: Negative.   Neurological: Negative.   Psychiatric/Behavioral: Negative.      Physical Exam: Last menstrual period 11/27/2022. Physical Exam Constitutional:      Appearance: Normal appearance.  Eyes:     Pupils: Pupils are equal, round, and reactive to light.  Cardiovascular:     Rate and Rhythm: Normal rate and regular rhythm.  Pulmonary:     Effort: Pulmonary effort is normal.  Abdominal:     General: Bowel sounds are normal.  Musculoskeletal:        General: Normal range of motion.  Skin:    General: Skin is warm.  Neurological:     General: No focal deficit present.     Mental Status: She is alert. Mental status is at baseline.  Psychiatric:        Mood and Affect: Mood normal.        Behavior: Behavior normal.        Thought Content: Thought content normal.        Judgment: Judgment normal.      Lab results: No results found for this or any previous visit (from the  past 24 hour(s)).  Imaging results:  No results found.   Assessment & Plan: Patient admitted to sickle cell day infusion center for management of pain crisis.  Patient is opiate tolerant Initiate IV dilaudid PCA.  Toradol 15 mg IV times one dose Tylenol 1000 mg by mouth times one dose Review CBC with differential, complete metabolic panel, and reticulocytes as results become available. Pain intensity will be reevaluated in context of functioning and relationship to baseline as care progresses If pain intensity  remains elevated and/or sudden change in hemodynamic stability transition to inpatient services for higher level of care.      Nolon Nations  APRN, MSN, FNP-C Patient Care [facility] [address] [place], [place] [address] [phone]  12/18/2022, 9:34 AM

## 2022-12-22 ENCOUNTER — Telehealth: Payer: Self-pay | Admitting: Internal Medicine

## 2022-12-22 ENCOUNTER — Other Ambulatory Visit: Payer: Self-pay | Admitting: Nurse Practitioner

## 2022-12-22 ENCOUNTER — Telehealth (HOSPITAL_COMMUNITY): Payer: Self-pay | Admitting: *Deleted

## 2022-12-22 DIAGNOSIS — D571 Sickle-cell disease without crisis: Secondary | ICD-10-CM

## 2022-12-22 MED ORDER — IBUPROFEN 800 MG PO TABS: 800.0000 mg | ORAL_TABLET | Freq: Three times a day (TID) | ORAL | 1 refills | Status: DC | PRN

## 2022-12-22 NOTE — Telephone Encounter (Signed)
Patient called requesting to come to the day hospital for sickle cell pain. Patient reports bilateral leg pain rated 7/10. Reports taking Oxycodone 30 mg ant Methadone 10 mg a few hours ago. Reports that she is now out of pain medication. COVID-19 screening done and patient denies all symptoms and exposures. Denies fever, chest pain, nausea, vomiting, diarrhea and abdominal pain. [place], FNP notified and advised that patient contact primary care provider to request medication refill. Patient can not come to the day hospital today but can call back in the morning if pain remains uncontrolled. Patient advised and expresses an understanding.

## 2022-12-22 NOTE — Progress Notes (Unsigned)
  Unfortunately there was no opiates detected  in her recent urine drug screen, this is a red flag considering the fact that she has prescriptions for opiates.   Due to this reason I will not be able to manage her opioid prescription.  I have refilled her ibuprofen and also referred her to the pain management clinic. I can refill other non opiates medications as well.  I had discussed this with [place] and Dr. Hyman Hopes.  Thank you             Component Ref Range & Units 4 d ago (12/18/22) 2 yr ago (08/21/20) 2 yr ago (05/11/20) 3 yr ago (08/10/19) 3 yr ago (04/18/19) 3 yr ago (02/28/19) 3 yr ago (01/18/19)  Opiates NONE DETECTED NONE DETECTED Negative R, CM POSITIVE Abnormal  POSITIVE Abnormal  POSITIVE Abnormal  Negative R, CM Negative R, CM  Cocaine NONE DETECTED NONE DETECTED  NONE DETECTED NONE DETECTED NONE DETECTED    Benzodiazepines NONE DETECTED NONE DETECTED  NONE DETECTED NONE DETECTED NONE DETECTED    Amphetamines NONE DETECTED NONE DETECTED  NONE DETECTED NONE DETECTED NONE DETECTED    Tetrahydrocannabinol NONE DETECTED NONE DETECTED  NONE DETECTED NONE DETECTED NONE DETECTED    Barbiturates NONE DETECTED NONE DETECTED

## 2022-12-22 NOTE — Telephone Encounter (Signed)
Pt states she has taken her medications and it should have shown. Pt would like a call. She was advised of pain mang referral and ibuprofen being sent in.

## 2022-12-22 NOTE — Telephone Encounter (Signed)
OK 

## 2022-12-22 NOTE — Telephone Encounter (Signed)
Please call patient regarding her medication. She is requesting oxycodone and methadone but would like to speak to [place] about it

## 2022-12-23 NOTE — Discharge Summary (Signed)
[facility] Discharge Summary   Patient ID: Stephanie Burch MRN: [id number] DOB/AGE: 08-Jan-1981 42 y.o.  Admit date: 12/18/2022 Discharge date: 12/18/2022  Primary Care Physician:  Quentin Angst, MD  Admission Diagnoses:  Active Problems:   Sickle cell pain crisis [facility])  Discharge Medications:  Allergies as of 12/18/2022   No Known Allergies      Medication List     TAKE these medications    acetaminophen 500 MG tablet Commonly known as: TYLENOL Take 1,000 mg by mouth every 6 (six) hours as needed for mild pain, moderate pain or headache.   folic acid 1 MG tablet Commonly known as: FOLVITE Take 1 mg by mouth daily.   naloxone 4 MG/0.1ML Liqd nasal spray kit Commonly known as: NARCAN Use as needed for opiod overdose   ondansetron 4 MG tablet Commonly known as: ZOFRAN Take by mouth.   oxycodone 30 MG immediate release tablet Commonly known as: ROXICODONE Take 30 mg by mouth every 4 (four) hours.   Vitamin D (Ergocalciferol) 1.25 MG (50000 UNIT) Caps capsule Commonly known as: DRISDOL Take 1 capsule (50,000 Units total) by mouth every 7 (seven) days.         Consults:  None  Significant Diagnostic Studies:  No results found.  History of Present Illness: Stephanie Burch is a 42 year old female with a medical history significant for sickle cell disease, chronic pain syndrome, opiate dependence and tolerance, obesity, and anemia of chronic disease presents with complaints of generalized pain that is consistent with her typical pain crisis. Patient says that pain intensity has been elevated over the past several days and unrelieved by her home medications. Patient last had oxycodone and methadone this am without sustained relief. Patient rates pain as 10/10, constant and throbbing. Patient denies fever, chills, chest pain, shortness of breath, nausea, vomiting, or diarrhea. No sick contacts or recent travel. She attributes pain to recent  medication adjustments and changes in weather.   [facility] Course: Patient admitted to sickle cell day infusion clinic for management of pain crisis.  Reviewed labs, consistent with patient's baseline. Pain managed with IV Dilaudid PCA, IV Toradol, and Tylenol. Pain intensity decreased throughout admission and patient is requesting discharge home. Patient is alert, oriented, and ambulating without assistance. She will discharge home in a hemodynamically stable condition.  Discharge instructions: Resume all home medications.   Follow up with PCP as previously  scheduled.   Discussed the importance of drinking 64 ounces of water daily, dehydration of red blood cells may lead further sickling.   Avoid all stressors that precipitate sickle cell pain crisis.     The patient was given clear instructions to go to ER or return to medical center if symptoms do not improve, worsen or new problems develop.    Physical Exam at Discharge:  BP 114/73 (BP Location: Right Arm)   Pulse 87   Temp 98.4 F (36.9 C) (Temporal)   Resp 15   LMP 11/27/2022 (Exact Date)   SpO2 91%  Physical Exam Constitutional:      Appearance: Normal appearance.  Eyes:     Pupils: Pupils are equal, round, and reactive to light.  Cardiovascular:     Rate and Rhythm: Normal rate.  Pulmonary:     Effort: Pulmonary effort is normal.  Abdominal:     General: Abdomen is flat. Bowel sounds are normal.  Musculoskeletal:        General: Normal range of motion.  Skin:  General: Skin is warm.  Neurological:     General: No focal deficit present.     Mental Status: She is alert. Mental status is at baseline.  Psychiatric:        Mood and Affect: Mood normal.        Behavior: Behavior normal.        Thought Content: Thought content normal.        Judgment: Judgment normal.      Disposition at Discharge: Discharge disposition: 01-Home or Self Care       Discharge Orders: Discharge  Instructions     Discharge patient   Complete by: As directed    Discharge disposition: 01-Home or Self Care   Discharge patient date: 12/18/2022       Condition at Discharge:   Stable  Time spent on Discharge:  Greater than 30 minutes.  Signed: Nolon Nations  APRN, MSN, FNP-C Patient Care [facility] [address] [place], [place] [address] [phone]  12/23/2022, 4:48 PM

## 2022-12-25 NOTE — Telephone Encounter (Signed)
Pt wants a call back about details on referral and medication

## 2022-12-26 ENCOUNTER — Telehealth: Payer: Self-pay | Admitting: Internal Medicine

## 2022-12-26 NOTE — Telephone Encounter (Signed)
Was sent to Dr. Hyman Hopes. KH

## 2022-12-26 NOTE — Telephone Encounter (Signed)
Copied from CRM [id number]. Topic: Clinical - Lab/Test Results >> Dec 26, 2022  9:41 AM Stephanie Burch wrote: Reason for CRM: Pt called in wanting more information/clarification on her drug screen from 11/14. Patient is requesting to speak to the doctor who is writing her prescription instead

## 2022-12-29 NOTE — Telephone Encounter (Signed)
Copied from CRM [id number]. Topic: Clinical - Request for Lab/Test Order >> Dec 29, 2022 10:49 AM Amy B wrote: Reason for CRM: Patient states she went to see pain management who informed her that her urine test may have been read wrong and request she take another urine test.  She requests a call back to discuss stating she has tried calling several times and has not heard back from the clinic.  Please call [phone].  Please advise if this is ok. KH

## 2022-12-30 NOTE — Telephone Encounter (Signed)
Copied from CRM [id number]. Topic: General - Call Back - No Documentation >> Dec 29, 2022  3:47 PM Mosetta Putt H wrote: Reason for CRM: patient called requesting a call back advised patient all patients are given a call back in order they are received patient disconnected the phone  Not sure if you have time. Please advise [facility]

## 2023-01-12 ENCOUNTER — Telehealth: Payer: Self-pay

## 2023-01-12 NOTE — Transitions of Care (Post Inpatient/ED Visit) (Signed)
01/12/2023  Name: Stephanie Burch MRN: [id number] DOB: Jan 03, 1981  Today's TOC FU Call Status: Today's TOC FU Call Status:: Successful TOC FU Call Completed TOC FU Call Complete Date: 01/12/23 Patient's Name and Date of Birth confirmed.  Transition Care Management Follow-up Telephone Call Date of Discharge: 01/10/23 Discharge Facility: Other [facility]) Name of Other (Non[facility]) Discharge Facility: [facility] Type of Discharge: Inpatient Admission Primary Inpatient Discharge Diagnosis:: Sickle Cell Crisis How have you been since you were released from the hospital?: Better Any questions or concerns?: Yes Patient Questions/Concerns:: Patient verbalized frustration with receiving F/U from [facility] provider and no longer desires to receive care from [facility] providers.  She is currently establishing/or has established new physician providers within the [place]. Patient Questions/Concerns Addressed: Other: Arts development officer of patient concerns, as requested by patient.)  Items Reviewed: Did you receive and understand the discharge instructions provided?: Yes Medications obtained,verified, and reconciled?: Yes (Medications Reviewed) Any new allergies since your discharge?: No Do you have support at home?: Yes People in Home:  Lives alone; has support in friend(s)  Medications Reviewed Today: Medications Reviewed Today     Reviewed by Wyline Mood, RN (Case Manager) on 01/12/23 at 1525  Med List Status: <None>   Medication Order Taking? Sig Documenting Provider Last Dose Status Informant  acetaminophen (TYLENOL) 500 MG tablet [id number] Yes Take 1,000 mg by mouth every 6 (six) hours as needed for mild pain, moderate pain or headache. [provider] Taking Active Self           Med Note Wyline Mood   Mon Jan 12, 2023  3:20 PM) As needed  folic acid (FOLVITE) 1 MG tablet [id number] Yes Take 1 mg by mouth daily. [provider] Taking  Active Self  ibuprofen (ADVIL) 800 MG tablet [id number] Yes Take 1 tablet (800 mg total) by mouth every 8 (eight) hours as needed. Donell Beers, FNP Taking Active            Med Note Wyline Mood   Mon Jan 12, 2023  3:21 PM) As needed.  Do NOT take if taking Meloxicam.  methadone (DOLOPHINE) 10 MG tablet [id number] Yes Take 10 mg by mouth every 8 (eight) hours as needed for moderate pain (pain score 4-6). [provider] Taking Active   methocarbamol (ROBAXIN) 500 MG tablet [id number] Yes Take 1 tablet by mouth every 6 (six) hours as needed. [provider] Taking Active            Med Note Wyline Mood   Mon Jan 12, 2023  3:24 PM) As needed for muscle spasms  naloxone [facility]) nasal spray 4 mg/0.1 mL [id number] Yes Use as needed for opiod overdose [place], Baird Kay, FNP Taking Active            Med Note Wyline Mood   Mon Jan 12, 2023  3:19 PM) As needed, per patient instructions.  ondansetron (ZOFRAN) 4 MG tablet [id number] No Take by mouth.  Patient not taking: Reported on 01/12/2023   [provider] Not Taking Active            Med Note Renelda Loma   Tue Mar 04, 2022  3:19 PM) Prn   oxycodone (ROXICODONE) 30 MG immediate release tablet [id number] Yes Take 30 mg by mouth every 4 (four) hours. [provider] Taking Active Self           Med Note Einar Grad Jan 12, 2023  3:25  PM) As needed for severe pain (pain rating 7-10)  Vitamin D, Ergocalciferol, (DRISDOL) 1.25 MG (50000 UNIT) CAPS capsule [id number] No Take 1 capsule (50,000 Units total) by mouth every 7 (seven) days.  Patient not taking: Reported on 01/12/2023   Donell Beers, FNP Not Taking Active             Home Care and Equipment/Supplies: Were Home Health Services Ordered?: NA Any new equipment or medical supplies ordered?: NA  Functional Questionnaire: Do you need assistance with bathing/showering or dressing?: No Do you need assistance with  meal preparation?: No Do you need assistance with eating?: No Do you need assistance with getting out of bed/getting out of a chair/moving?: No Do you have difficulty managing or taking your medications?: No  Follow up appointments reviewed: PCP Follow-up appointment confirmed?: Yes Date of PCP follow-up appointment?: 01/14/23 Follow-up Provider: With Dr. Hyman Hopes; however, patient expressed she would no longer be using [facility] providers, unless she has to.  She is currently establishing/or has established new physician providers within the [facility] - Dr. Cyndy Freeze, [facility] Medicine; Dr. Donia Guiles [facility] Hematology/Oncology. Specialist Hospital Follow-up appointment confirmed?: Yes Date of Specialist follow-up appointment?: 01/21/23 Follow-Up Specialty Provider:: Dr. Donia Guiles, [facility] Hem/Onc Do you need transportation to your follow-up appointment?: No Do you understand care options if your condition(s) worsen?: Yes-patient verbalized understanding  SDOH Interventions Today    Flowsheet Row Most Recent Value  SDOH Interventions   Food Insecurity Interventions Intervention Not Indicated, Patient Declined  Housing Interventions Patient Declined  Transportation Interventions Intervention Not Indicated  Utilities Interventions Intervention Not Indicated     TOC Interventions: -Reviewed/discussed upcoming appointment with [facility], Dr. Hyman Hopes on 01/14/2023.  Patient is aware of the appointment but sees little value in following up if they will no longer fill her pain medications.  Patient states she has not been successful in getting follow-up to her messages re: filling her pain medications.  RNCM did encourage patient to keep her appointment on 01/14/23 so she could discuss with her physician their plans for her ongoing pain management until she establishes a relationship with a new pain med. provider. -Reviewed scheduled  specialist appointment with Dr. Donia Guiles, Hematology/Oncology on 01/21/23. -Reviewed prescribed 5 day supply of her current medications and need for follow-up appointment with a pain provider to ensure ongoing home pain management to avoid future hospitalization should her medications run out. -Important to update Medicaid [place] [employer] with any changes in doctors involved in your care. -Important to seek immediate medical attention for pain level that is greater than your comfort goal for home management or for signs/symptoms of infection, bleeding or anemia.  Patient verbalizes good understanding and is actively involved/engaged in her healthcare self-management.  Fritz Cauthon Daphine Deutscher BSN, RN RN Care Manager   Transitions of Care [facility] - [facility] Direct Dial Number:  [phone]

## 2023-01-12 NOTE — Transitions of Care (Post Inpatient/ED Visit) (Signed)
   01/12/2023  Name: Stephanie Burch MRN: [id number] DOB: 1980/10/16  Today's TOC FU Call Status: Today's TOC FU Call Status:: Unsuccessful Call (1st Attempt) Unsuccessful Call (1st Attempt) Date: 01/12/23  Attempted to reach the patient regarding the most recent Inpatient/ED visit.  Follow Up Plan: Additional outreach attempts will be made to reach the patient to complete the Transitions of Care (Post Inpatient/ED visit) call.   [name] Daphine Deutscher BSN, RN RN Care Manager   Transitions of Care [facility] - [facility] Direct Dial Number:  [phone]

## 2023-01-14 ENCOUNTER — Ambulatory Visit: Payer: Self-pay | Admitting: Nurse Practitioner

## 2023-03-18 ENCOUNTER — Telehealth (HOSPITAL_COMMUNITY): Payer: Self-pay

## 2023-03-18 NOTE — Telephone Encounter (Signed)
Pt called day hospital wanting to come in today for sickle cell pain treatment. Pt reports 8/10 pain to bilateral legs. Pt reports taking Methadone 10 mg and Oxycodone 30 mg at 6 AM. Pt denies being to the ER recently. Pt screened for COVID and denies symptoms and exposures. Pt denies fever, chest pain, N/V/D, abdominal pain. Due to no provider coverage at the day hospital, the day hospital is closed today. Pt advised to call back tomorrow.

## 2023-03-19 ENCOUNTER — Telehealth (HOSPITAL_COMMUNITY): Payer: Self-pay

## 2023-03-19 ENCOUNTER — Non-Acute Institutional Stay (HOSPITAL_COMMUNITY)
Admission: AD | Admit: 2023-03-19 | Discharge: 2023-03-19 | Disposition: A | Discharge status: Home / Self Care | Payer: Medicare Other | Source: Ambulatory Visit | Attending: Internal Medicine | Admitting: Internal Medicine

## 2023-03-19 DIAGNOSIS — D5700 Hb-SS disease with crisis, unspecified: Secondary | ICD-10-CM | POA: Diagnosis present

## 2023-03-19 DIAGNOSIS — G894 Chronic pain syndrome: Secondary | ICD-10-CM | POA: Insufficient documentation

## 2023-03-19 LAB — LACTATE DEHYDROGENASE: LDH: 130 U/L (ref 98–192)

## 2023-03-19 MED ORDER — DIPHENHYDRAMINE HCL 25 MG PO CAPS: 25.0000 mg | ORAL_CAPSULE | ORAL | Status: DC | PRN

## 2023-03-19 MED ORDER — HYDROMORPHONE 1 MG/ML IV SOLN
INTRAVENOUS | Status: DC
  Administered 2023-03-19: 13 mg via INTRAVENOUS
  Administered 2023-03-19: 30 mg via INTRAVENOUS
  Filled 2023-03-19: qty 30

## 2023-03-19 MED ORDER — SODIUM CHLORIDE 0.9% FLUSH: 9.0000 mL | INTRAVENOUS | Status: DC | PRN

## 2023-03-19 MED ORDER — ONDANSETRON HCL 4 MG/2ML IJ SOLN
4.0000 mg | Freq: Four times a day (QID) | INTRAMUSCULAR | Status: DC | PRN
  Administered 2023-03-19: 4 mg via INTRAVENOUS
  Filled 2023-03-19: qty 2

## 2023-03-19 MED ORDER — POLYETHYLENE GLYCOL 3350 17 G PO PACK: 17.0000 g | PACK | Freq: Every day | ORAL | Status: DC | PRN

## 2023-03-19 MED ORDER — KETOROLAC TROMETHAMINE 15 MG/ML IJ SOLN
15.0000 mg | Freq: Four times a day (QID) | INTRAMUSCULAR | Status: DC
  Administered 2023-03-19: 15 mg via INTRAVENOUS
  Filled 2023-03-19: qty 1

## 2023-03-19 MED ORDER — NALOXONE HCL 0.4 MG/ML IJ SOLN: 0.4000 mg | INTRAMUSCULAR | Status: DC | PRN

## 2023-03-19 MED ORDER — DEXTROSE-SODIUM CHLORIDE 5-0.45 % IV SOLN: INTRAVENOUS | Status: DC

## 2023-03-19 MED ORDER — SENNOSIDES-DOCUSATE SODIUM 8.6-50 MG PO TABS: 1.0000 | ORAL_TABLET | Freq: Two times a day (BID) | ORAL | Status: DC

## 2023-03-19 NOTE — Discharge Summary (Signed)
Physician Discharge Summary   Patient: Stephanie Burch MRN: [id number] DOB: [birth year]  Admit date:     03/19/2023  Discharge date: 03/19/2023  Discharge Physician: Lonia Blood   PCP: Quentin Angst, MD   Recommendations at discharge:   Patient is complaining of 8 out of 10 still.  She wants to be able to be admitted or return tomorrow  Discharge Diagnoses: Active Problems:   Sickle cell disease with crisis [facility])  Resolved Problems:   * No resolved hospital problems. [facility] Course: Patient admitted with sickle cell pain with crisis.  Initial pain was 10 out of 10.  At the end of the day is down to 8 out of 10 out of Dilaudid PCA, Toradol and IV fluid.  Patient will try home regimen.  If it does not work out she would likely return for treatment.  Assessment and Plan: No notes have been filed under this hospital service. Service: Hospitalist        Consultants: None Procedures performed: None Disposition: Home Diet recommendation:  Discharge Diet Orders (From admission, onward)     Start     Ordered   03/19/23 0000  Diet - low sodium heart healthy        03/19/23 1529           Regular diet DISCHARGE MEDICATION: Allergies as of 03/19/2023   No Known Allergies      Medication List     TAKE these medications    acetaminophen 500 MG tablet Commonly known as: TYLENOL Take 1,000 mg by mouth every 6 (six) hours as needed for mild pain, moderate pain or headache.   folic acid 1 MG tablet Commonly known as: FOLVITE Take 1 mg by mouth daily.   ibuprofen 800 MG tablet Commonly known as: ADVIL Take 1 tablet (800 mg total) by mouth every 8 (eight) hours as needed.   naloxone 4 MG/0.1ML Liqd nasal spray kit Commonly known as: NARCAN Use as needed for opiod overdose   ondansetron 4 MG tablet Commonly known as: ZOFRAN Take by mouth.   oxycodone 30 MG immediate release tablet Commonly known as: ROXICODONE Take 30 mg by mouth every 4 (four)  hours.   Vitamin D (Ergocalciferol) 1.25 MG (50000 UNIT) Caps capsule Commonly known as: DRISDOL Take 1 capsule (50,000 Units total) by mouth every 7 (seven) days.        Discharge Exam: There were no vitals filed for this visit. Constitutional: NAD, calm, comfortable Eyes: PERRL, lids and conjunctivae normal ENMT: Mucous membranes are moist. Posterior pharynx clear of any exudate or lesions.Normal dentition.  Neck: normal, supple, no masses, no thyromegaly Respiratory: clear to auscultation bilaterally, no wheezing, no crackles. Normal respiratory effort. No accessory muscle use.  Cardiovascular: Regular rate and rhythm, no murmurs / rubs / gallops. No extremity edema. 2+ pedal pulses. No carotid bruits.  Abdomen: no tenderness, no masses palpated. No hepatosplenomegaly. Bowel sounds positive.  Musculoskeletal: Good range of motion, no joint swelling or tenderness, Skin: no rashes, lesions, ulcers. No induration Neurologic: CN 2-12 grossly intact. Sensation intact, DTR normal. Strength 5/5 in all 4.  Psychiatric: Normal judgment and insight. Alert and oriented x 3. Normal mood   Condition at discharge: good  The results of significant diagnostics from this hospitalization (including imaging, microbiology, ancillary and laboratory) are listed below for reference.   Imaging Studies: No results found.  Microbiology: Results for orders placed or performed during the hospital encounter of 11/21/21  SARS Coronavirus 2 by RT  PCR (hospital order, performed in [facility] hospital lab) *cepheid single result test* Anterior Nasal Swab     Status: None   Collection Time: 11/21/21  2:43 PM   Specimen: Anterior Nasal Swab  Result Value Ref Range Status   SARS Coronavirus 2 by RT PCR NEGATIVE NEGATIVE Final    Comment: (NOTE) SARS-CoV-2 target nucleic acids are NOT DETECTED.  The SARS-CoV-2 RNA is generally detectable in upper and lower respiratory specimens during the acute phase of  infection. The lowest concentration of SARS-CoV-2 viral copies this assay can detect is 250 copies / mL. A negative result does not preclude SARS-CoV-2 infection and should not be used as the sole basis for treatment or other patient management decisions.  A negative result may occur with improper specimen collection / handling, submission of specimen other than nasopharyngeal swab, presence of viral mutation(s) within the areas targeted by this assay, and inadequate number of viral copies (<250 copies / mL). A negative result must be combined with clinical observations, patient history, and epidemiological information.  Fact Sheet for Patients:   RoadLapTop.co.za  Fact Sheet for Healthcare Providers: http://kim-miller.com/  This test is not yet approved or  cleared by the [place] FDA and has been authorized for detection and/or diagnosis of SARS-CoV-2 by FDA under an Emergency Use Authorization (EUA).  This EUA will remain in effect (meaning this test can be used) for the duration of the COVID-19 declaration under Section 564(b)(1) of the Act, 21 U.S.C. section 360bbb-3(b)(1), unless the authorization is terminated or revoked sooner.  Performed at [facility], [address] [place], [place] [address]     Labs: CBC: Recent Labs  Lab 03/20/23 1115  WBC 7.5  NEUTROABS 4.3  HGB 12.1  HCT 35.7*  MCV 75.2*  PLT 239   Basic Metabolic Panel: No results for input(s): "NA", "K", "CL", "CO2", "GLUCOSE", "BUN", "CREATININE", "CALCIUM", "MG", "PHOS" in the last 168 hours. Liver Function Tests: No results for input(s): "AST", "ALT", "ALKPHOS", "BILITOT", "PROT", "ALBUMIN" in the last 168 hours. CBG: No results for input(s): "GLUCAP" in the last 168 hours.  Discharge time spent: less than 30 minutes.  SignedLonia Blood, MD [facility] 03/24/2023

## 2023-03-19 NOTE — H&P (Signed)
History and Physical    Patient: Stephanie Burch:[id number] DOB: [birth year] DOA: 03/19/2023 DOS: the patient was seen and examined on 03/19/2023 PCP: Quentin Angst, MD  Patient coming from: Home  Chief Complaint: No chief complaint on file.  HPI: Stephanie Burch is a 43 y.o. female with medical history significant of sickle cell anemia, chronic pain syndrome, healthcare associated pneumonia, chronic pain syndrome and anemia of chronic disease who came to sickle cell day hospital with complaint of pain rated as 10 out of 10.  Mainly on her backs.  She took methadone 10 mg and oxycodone 30 mg but no relief.  No nausea vomiting or diarrhea.  Patient was admitted for further treatment of pain.  Review of Systems: As mentioned in the history of present illness. All other systems reviewed and are negative. Past Medical History:  Diagnosis Date   Acute kidney injury (HCC) 11/07/2019   Anemia    Chronic pain    COVID-19 virus detected 10/22/2019   Depression    HCAP (healthcare-associated pneumonia) 11/07/2019   History of blood transfusion 2013   x 2   HSV infection    Hypoxia    Pneumonia    x 1   Pulmonary edema 11/11/2019   Sepsis (HCC) 11/07/2019   Sickle cell anemia (HCC) 09/2021   gets them frequently   Tachycardia with heart rate 100-120 beats per minute    Vitamin B12 deficiency 12/2018   Vitamin D deficiency    Past Surgical History:  Procedure Laterality Date   CESAREAN SECTION  2013   x 1   TOOTH EXTRACTION N/A 10/18/2021   Procedure: DENTAL RESTORATION/EXTRACTIONS;  Surgeon: Ocie Doyne, DMD;  Location: [facility] OR;  Service: Oral Surgery;  Laterality: N/A;   Social History:  reports that she has never smoked. She has never used smokeless tobacco. She reports that she does not currently use alcohol. She reports that she does not use drugs.  No Known Allergies  Family History  Problem Relation Age of Onset   Hypertension Mother    Sickle cell trait Mother     Diabetes Father    Sickle cell trait Father     Prior to Admission medications   Medication Sig Start Date End Date Taking? Authorizing Provider  acetaminophen (TYLENOL) 500 MG tablet Take 1,000 mg by mouth every 6 (six) hours as needed for mild pain, moderate pain or headache.    [provider]  folic acid (FOLVITE) 1 MG tablet Take 1 mg by mouth daily.    [provider]  ibuprofen (ADVIL) 800 MG tablet Take 1 tablet (800 mg total) by mouth every 8 (eight) hours as needed. 12/22/22   Donell Beers, FNP  naloxone [facility]) nasal spray 4 mg/0.1 mL Use as needed for opiod overdose 03/04/22   Paseda, Phillips Grout R, FNP  ondansetron (ZOFRAN) 4 MG tablet Take by mouth. Patient not taking: Reported on 01/12/2023 02/28/22   [provider]  oxycodone (ROXICODONE) 30 MG immediate release tablet Take 30 mg by mouth every 4 (four) hours.    [provider]  Vitamin D, Ergocalciferol, (DRISDOL) 1.25 MG (50000 UNIT) CAPS capsule Take 1 capsule (50,000 Units total) by mouth every 7 (seven) days. Patient not taking: Reported on 01/12/2023 03/04/22   Donell Beers, FNP    Physical Exam: There were no vitals filed for this visit. Constitutional: NAD, calm, comfortable Eyes: PERRL, lids and conjunctivae normal ENMT: Mucous membranes are moist. Posterior pharynx clear of any  exudate or lesions.Normal dentition.  Neck: normal, supple, no masses, no thyromegaly Respiratory: clear to auscultation bilaterally, no wheezing, no crackles. Normal respiratory effort. No accessory muscle use.  Cardiovascular: Regular rate and rhythm, no murmurs / rubs / gallops. No extremity edema. 2+ pedal pulses. No carotid bruits.  Abdomen: no tenderness, no masses palpated. No hepatosplenomegaly. Bowel sounds positive.  Musculoskeletal: Good range of motion, no joint swelling or tenderness, Skin: no rashes, lesions, ulcers. No induration Neurologic: CN 2-12 grossly intact. Sensation  intact, DTR normal. Strength 5/5 in all 4.  Psychiatric: Normal judgment and insight. Alert and oriented x 3. Normal mood  Data Reviewed:  There are no new results to review at this time.  Assessment and Plan:  #1 sickle cell pain crisis: Patient will be admitted for pain management.  Patient will be initiated on Dilaudid PCA, Toradol, IV fluids and pain management.  #2 anemia of chronic disease: Continue to monitor H&H continue monitoring.  #3 chronic pain syndrome: Patient will continue her methadone.    Advance Care Planning:   Code Status: Full Code   Consults: None  Family Communication: No family at bedside  Severity of Illness: The appropriate patient status for this patient is OBSERVATION. Observation status is judged to be reasonable and necessary in order to provide the required intensity of service to ensure the patient's safety. The patient's presenting symptoms, physical exam findings, and initial radiographic and laboratory data in the context of their medical condition is felt to place them at decreased risk for further clinical deterioration. Furthermore, it is anticipated that the patient will be medically stable for discharge from the hospital within 2 midnights of admission.   AuthorLonia Blood, MD 03/19/2023 10:37 AM  For on call review [email or web address].

## 2023-03-19 NOTE — Telephone Encounter (Signed)
Pt called day hospital wanting to come to day hospital today for sickle cell pain treatment. Pt reports taking Methadone 10 mg and Oxycodone 30 mg. Pt screened for COVID and denies symptoms and exposures. Pt denies fever, chest pain, N/V/D, abdominal pain. Dr. Mikeal Hawthorne notified and per provider patient can come to the day hospital today. Pt notified and verbalized understanding. Pt states that she will use taxi transportation services today.

## 2023-03-19 NOTE — Progress Notes (Signed)
Patient admitted to the day hospital for sickle cell pain. Initially, patient reported bilateral leg pain rated 9/10. For pain management, patient placed on Sickle Cell Dose Dilaudid PCA, given IV Toradol and hydrated with IV fluids. At discharge, patient rated pain at 8/10. Vital signs stable. AVS offered but patient refused. Patient alert, oriented and ambulatory at discharge.

## 2023-03-20 ENCOUNTER — Telehealth (HOSPITAL_COMMUNITY): Payer: Self-pay

## 2023-03-20 ENCOUNTER — Non-Acute Institutional Stay (HOSPITAL_COMMUNITY)
Admission: AD | Admit: 2023-03-20 | Discharge: 2023-03-20 | Disposition: A | Discharge status: Home / Self Care | Payer: Medicare Other | Source: Ambulatory Visit | Attending: Internal Medicine | Admitting: Internal Medicine

## 2023-03-20 DIAGNOSIS — G894 Chronic pain syndrome: Secondary | ICD-10-CM | POA: Diagnosis present

## 2023-03-20 DIAGNOSIS — D5700 Hb-SS disease with crisis, unspecified: Secondary | ICD-10-CM | POA: Diagnosis not present

## 2023-03-20 DIAGNOSIS — D638 Anemia in other chronic diseases classified elsewhere: Secondary | ICD-10-CM | POA: Diagnosis present

## 2023-03-20 LAB — CBC WITH DIFFERENTIAL/PLATELET
Abs Immature Granulocytes: 0.02 10*3/uL (ref 0.00–0.07)
Basophils Absolute: 0 10*3/uL (ref 0.0–0.1)
Basophils Relative: 0 %
Eosinophils Absolute: 0.1 10*3/uL (ref 0.0–0.5)
Eosinophils Relative: 2 %
HCT: 35.7 % — ABNORMAL LOW (ref 36.0–46.0)
Hemoglobin: 12.1 g/dL (ref 12.0–15.0)
Immature Granulocytes: 0 %
Lymphocytes Relative: 34 %
Lymphs Abs: 2.6 10*3/uL (ref 0.7–4.0)
MCH: 25.5 pg — ABNORMAL LOW (ref 26.0–34.0)
MCHC: 33.9 g/dL (ref 30.0–36.0)
MCV: 75.2 fL — ABNORMAL LOW (ref 80.0–100.0)
Monocytes Absolute: 0.5 10*3/uL (ref 0.1–1.0)
Monocytes Relative: 7 %
Neutro Abs: 4.3 10*3/uL (ref 1.7–7.7)
Neutrophils Relative %: 57 %
Platelets: 239 10*3/uL (ref 150–400)
RBC: 4.75 MIL/uL (ref 3.87–5.11)
RDW: 17.6 % — ABNORMAL HIGH (ref 11.5–15.5)
WBC: 7.5 10*3/uL (ref 4.0–10.5)
nRBC: 0.4 % — ABNORMAL HIGH (ref 0.0–0.2)

## 2023-03-20 MED ORDER — NALOXONE HCL 0.4 MG/ML IJ SOLN: 0.4000 mg | INTRAMUSCULAR | Status: DC | PRN

## 2023-03-20 MED ORDER — DEXTROSE-SODIUM CHLORIDE 5-0.45 % IV SOLN: INTRAVENOUS | Status: DC

## 2023-03-20 MED ORDER — HYDROMORPHONE 1 MG/ML IV SOLN
INTRAVENOUS | Status: DC
  Administered 2023-03-20: 30 mg via INTRAVENOUS
  Administered 2023-03-20: 13 mg via INTRAVENOUS
  Filled 2023-03-20: qty 30

## 2023-03-20 MED ORDER — SODIUM CHLORIDE 0.9% FLUSH: 9.0000 mL | INTRAVENOUS | Status: DC | PRN

## 2023-03-20 MED ORDER — ONDANSETRON HCL 4 MG/2ML IJ SOLN
4.0000 mg | Freq: Four times a day (QID) | INTRAMUSCULAR | Status: DC | PRN
  Administered 2023-03-20: 4 mg via INTRAVENOUS
  Filled 2023-03-20: qty 2

## 2023-03-20 MED ORDER — SENNOSIDES-DOCUSATE SODIUM 8.6-50 MG PO TABS: 1.0000 | ORAL_TABLET | Freq: Two times a day (BID) | ORAL | Status: DC

## 2023-03-20 MED ORDER — KETOROLAC TROMETHAMINE 15 MG/ML IJ SOLN
15.0000 mg | Freq: Four times a day (QID) | INTRAMUSCULAR | Status: DC
  Administered 2023-03-20: 15 mg via INTRAVENOUS
  Filled 2023-03-20: qty 1

## 2023-03-20 MED ORDER — POLYETHYLENE GLYCOL 3350 17 G PO PACK: 17.0000 g | PACK | Freq: Every day | ORAL | Status: DC | PRN

## 2023-03-20 MED ORDER — DIPHENHYDRAMINE HCL 25 MG PO CAPS
25.0000 mg | ORAL_CAPSULE | ORAL | Status: DC | PRN
  Filled 2023-03-20: qty 1

## 2023-03-20 NOTE — Telephone Encounter (Signed)
Pt called day hospital today wanting to come in for sickle cell pain treatment. Pt reports 8/10 pain to her legs. Pt reports taking Methadone 10 mg and Oxycodone 15 mg at 5 AM. Pt was seen in the day hospital yesterday, and denies being to the ER recently. Pt denies fever, chest pain, N/V/D, and abdominal pain. Dr. Mikeal Hawthorne notified and per provider pt can come to the clinic today. Pt notified and verbalized understanding. Per pt she will need to use taxi transportation services today.

## 2023-03-20 NOTE — H&P (Signed)
History and Physical    Patient: Stephanie Burch DOB: 11/30/1980 DOA: 03/20/2023 DOS: the patient was seen and examined on 03/20/2023 PCP: Quentin Angst, MD  Patient coming from: Home  Chief Complaint:  Chief Complaint  Patient presents with   Sickle Cell Pain Crisis   HPI: Stephanie Burch is a 43 y.o. female with medical history significant of sickle cell disease, anemia, disease, chronic pain syndrome who was here yesterday with sickle cell pain crisis.  Treated and sent home.  Patient returned with 8 out of 10 pain again.  She has taken the methadone and oxycodone 50 mg this morning but no relief.  Patient is readmitted for further management.  Review of Systems: As mentioned in the history of present illness. All other systems reviewed and are negative. Past Medical History:  Diagnosis Date   Acute kidney injury (HCC) 11/07/2019   Anemia    Chronic pain    COVID-19 virus detected 10/22/2019   Depression    HCAP (healthcare-associated pneumonia) 11/07/2019   History of blood transfusion 2013   x 2   HSV infection    Hypoxia    Pneumonia    x 1   Pulmonary edema 11/11/2019   Sepsis (HCC) 11/07/2019   Sickle cell anemia (HCC) 09/2021   gets them frequently   Tachycardia with heart rate 100-120 beats per minute    Vitamin B12 deficiency 12/2018   Vitamin D deficiency    Past Surgical History:  Procedure Laterality Date   CESAREAN SECTION  2013   x 1   TOOTH EXTRACTION N/A 10/18/2021   Procedure: DENTAL RESTORATION/EXTRACTIONS;  Surgeon: Ocie Doyne, DMD;  Location: [facility] OR;  Service: Oral Surgery;  Laterality: N/A;   Social History:  reports that she has never smoked. She has never used smokeless tobacco. She reports that she does not currently use alcohol. She reports that she does not use drugs.  No Known Allergies  Family History  Problem Relation Age of Onset   Hypertension Mother    Sickle cell trait Mother    Diabetes Father    Sickle cell  trait Father     Prior to Admission medications   Medication Sig Start Date End Date Taking? Authorizing Provider  acetaminophen (TYLENOL) 500 MG tablet Take 1,000 mg by mouth every 6 (six) hours as needed for mild pain, moderate pain or headache.    [provider]  folic acid (FOLVITE) 1 MG tablet Take 1 mg by mouth daily.    [provider]  ibuprofen (ADVIL) 800 MG tablet Take 1 tablet (800 mg total) by mouth every 8 (eight) hours as needed. 12/22/22   Donell Beers, FNP  naloxone [facility]) nasal spray 4 mg/0.1 mL Use as needed for opiod overdose 03/04/22   Donell Beers, FNP  ondansetron (ZOFRAN) 4 MG tablet Take by mouth. 02/28/22   [provider]  oxycodone (ROXICODONE) 30 MG immediate release tablet Take 30 mg by mouth every 4 (four) hours.    [provider]  Vitamin D, Ergocalciferol, (DRISDOL) 1.25 MG (50000 UNIT) CAPS capsule Take 1 capsule (50,000 Units total) by mouth every 7 (seven) days. Patient not taking: Reported on 01/12/2023 03/04/22   Donell Beers, FNP    Physical Exam: There were no vitals filed for this visit. Constitutional: NAD, calm, comfortable Eyes: PERRL, lids and conjunctivae normal ENMT: Mucous membranes are moist. Posterior pharynx clear of any exudate or lesions.Normal dentition.  Neck: normal, supple, no masses,  no thyromegaly Respiratory: clear to auscultation bilaterally, no wheezing, no crackles. Normal respiratory effort. No accessory muscle use.  Cardiovascular: Regular rate and rhythm, no murmurs / rubs / gallops. No extremity edema. 2+ pedal pulses. No carotid bruits.  Abdomen: no tenderness, no masses palpated. No hepatosplenomegaly. Bowel sounds positive.  Musculoskeletal: Good range of motion, no joint swelling or tenderness, Skin: no rashes, lesions, ulcers. No induration Neurologic: CN 2-12 grossly intact. Sensation intact, DTR normal. Strength 5/5 in all 4.  Psychiatric: Normal judgment and  insight. Alert and oriented x 3. Normal mood  Data Reviewed:  There are no new results to review at this time.  Assessment and Plan:  #1 sickle cell pain crisis: Patient will be admitted.  Initiated on Dilaudid PCA Toradol and IV fluids.  Continue pain assessment tool patient is ready to be discharged.  #2 anemia of chronic disease: Monitor H&H.  Will recheck H&H.  #3 chronic pain syndrome: Patient will be discharged home on her chronic methadone therapy.    Advance Care Planning:   Code Status: Full Code   Consults: None  Family Communication: No family bedside  Severity of Illness: The appropriate patient status for this patient is OBSERVATION. Observation status is judged to be reasonable and necessary in order to provide the required intensity of service to ensure the patient's safety. The patient's presenting symptoms, physical exam findings, and initial radiographic and laboratory data in the context of their medical condition is felt to place them at decreased risk for further clinical deterioration. Furthermore, it is anticipated that the patient will be medically stable for discharge from the hospital within 2 midnights of admission.   AuthorLonia Blood, MD 03/20/2023 10:27 AM  For on call review [email or web address].

## 2023-03-20 NOTE — Progress Notes (Signed)
Patient admitted to the day hospital for sickle cell pain. Initially, patient reported bilateral leg pain rated 8/10. For pain management, patient placed on Sickle Cell Dose Dilaudid PCA, given IV Toradol and hydrated with IV fluids. At discharge, patient rated pain at 6/10. Vital signs stable. AVS offered but patient refused. Patient alert, oriented and ambulatory at discharge.

## 2023-03-20 NOTE — Discharge Summary (Signed)
Physician Discharge Summary   Patient: Stephanie Burch MRN: [id number] DOB: Apr 01, 1980  Admit date:     03/20/2023  Discharge date: 03/20/2023  Discharge Physician: Lonia Blood   PCP: Quentin Angst, MD   Recommendations at discharge:   Patient being discharged home on home therapy.  Pain is much better now follow-up with PCP  Discharge Diagnoses: Active Problems:   Chronic pain syndrome   Anemia of chronic disease   Sickle cell disease with crisis (HCC)  Resolved Problems:   * No resolved hospital problems. [facility] Course: Patient was admitted and started on Dilaudid PCA, Toradol, IV fluids.  At the end of the day her pain went from 8 out of 10 to 6 out of 10.  She felt comfortable.  Went home to resume her methadone and oxycodone.  Patient to follow-up with her PCP as well.  Assessment and Plan: No notes have been filed under this hospital service. Service: Hospitalist        Consultants: None Procedures performed: None Disposition: Home Diet recommendation:  Regular diet DISCHARGE MEDICATION: Allergies as of 03/20/2023   No Known Allergies      Medication List     TAKE these medications    acetaminophen 500 MG tablet Commonly known as: TYLENOL Take 1,000 mg by mouth every 6 (six) hours as needed for mild pain, moderate pain or headache.   folic acid 1 MG tablet Commonly known as: FOLVITE Take 1 mg by mouth daily.   ibuprofen 800 MG tablet Commonly known as: ADVIL Take 1 tablet (800 mg total) by mouth every 8 (eight) hours as needed.   naloxone 4 MG/0.1ML Liqd nasal spray kit Commonly known as: NARCAN Use as needed for opiod overdose   ondansetron 4 MG tablet Commonly known as: ZOFRAN Take by mouth.   oxycodone 30 MG immediate release tablet Commonly known as: ROXICODONE Take 30 mg by mouth every 4 (four) hours.   Vitamin D (Ergocalciferol) 1.25 MG (50000 UNIT) Caps capsule Commonly known as: DRISDOL Take 1 capsule (50,000 Units  total) by mouth every 7 (seven) days.        Discharge Exam: There were no vitals filed for this visit. Constitutional: NAD, calm, comfortable Eyes: PERRL, lids and conjunctivae normal ENMT: Mucous membranes are moist. Posterior pharynx clear of any exudate or lesions.Normal dentition.  Neck: normal, supple, no masses, no thyromegaly Respiratory: clear to auscultation bilaterally, no wheezing, no crackles. Normal respiratory effort. No accessory muscle use.  Cardiovascular: Regular rate and rhythm, no murmurs / rubs / gallops. No extremity edema. 2+ pedal pulses. No carotid bruits.  Abdomen: no tenderness, no masses palpated. No hepatosplenomegaly. Bowel sounds positive.  Musculoskeletal: Good range of motion, no joint swelling or tenderness, Skin: no rashes, lesions, ulcers. No induration Neurologic: CN 2-12 grossly intact. Sensation intact, DTR normal. Strength 5/5 in all 4.  Psychiatric: Normal judgment and insight. Alert and oriented x 3. Normal mood   Condition at discharge: good  The results of significant diagnostics from this hospitalization (including imaging, microbiology, ancillary and laboratory) are listed below for reference.   Imaging Studies: No results found.  Microbiology: Results for orders placed or performed during the hospital encounter of 11/21/21  SARS Coronavirus 2 by RT PCR (hospital order, performed in [facility] hospital lab) *cepheid single result test* Anterior Nasal Swab     Status: None   Collection Time: 11/21/21  2:43 PM   Specimen: Anterior Nasal Swab  Result Value Ref Range Status  SARS Coronavirus 2 by RT PCR NEGATIVE NEGATIVE Final    Comment: (NOTE) SARS-CoV-2 target nucleic acids are NOT DETECTED.  The SARS-CoV-2 RNA is generally detectable in upper and lower respiratory specimens during the acute phase of infection. The lowest concentration of SARS-CoV-2 viral copies this assay can detect is 250 copies / mL. A negative result does  not preclude SARS-CoV-2 infection and should not be used as the sole basis for treatment or other patient management decisions.  A negative result may occur with improper specimen collection / handling, submission of specimen other than nasopharyngeal swab, presence of viral mutation(s) within the areas targeted by this assay, and inadequate number of viral copies (<250 copies / mL). A negative result must be combined with clinical observations, patient history, and epidemiological information.  Fact Sheet for Patients:   RoadLapTop.co.za  Fact Sheet for Healthcare Providers: http://kim-miller.com/  This test is not yet approved or  cleared by the [place] FDA and has been authorized for detection and/or diagnosis of SARS-CoV-2 by FDA under an Emergency Use Authorization (EUA).  This EUA will remain in effect (meaning this test can be used) for the duration of the COVID-19 declaration under Section 564(b)(1) of the Act, 21 U.S.C. section 360bbb-3(b)(1), unless the authorization is terminated or revoked sooner.  Performed at [facility], [address] [place], [place] [address]     Labs: CBC: Recent Labs  Lab 03/20/23 1115  WBC 7.5  NEUTROABS 4.3  HGB 12.1  HCT 35.7*  MCV 75.2*  PLT 239   Basic Metabolic Panel: No results for input(s): "NA", "K", "CL", "CO2", "GLUCOSE", "BUN", "CREATININE", "CALCIUM", "MG", "PHOS" in the last 168 hours. Liver Function Tests: No results for input(s): "AST", "ALT", "ALKPHOS", "BILITOT", "PROT", "ALBUMIN" in the last 168 hours. CBG: No results for input(s): "GLUCAP" in the last 168 hours.  Discharge time spent: less than 30 minutes.  SignedLonia Blood, MD [facility] 03/24/2023

## 2023-03-20 NOTE — Hospital Course (Addendum)
Patient was admitted and started on Dilaudid PCA, Toradol, IV fluids.  At the end of the day her pain went from 8 out of 10 to 6 out of 10.  She felt comfortable.  Went home to resume her methadone and oxycodone.  Patient to follow-up with her PCP as well.

## 2023-03-24 NOTE — Hospital Course (Signed)
Patient admitted with sickle cell pain with crisis.  Initial pain was 10 out of 10.  At the end of the day is down to 8 out of 10 out of Dilaudid PCA, Toradol and IV fluid.  Patient will try home regimen.  If it does not work out she would likely return for treatment.

## 2023-03-26 ENCOUNTER — Telehealth: Payer: Self-pay

## 2023-03-26 NOTE — Transitions of Care (Post Inpatient/ED Visit) (Deleted)
   03/26/2023  Name: Stephanie Burch MRN: [id number] DOB: 02-25-1980  {[id number]}

## 2023-03-26 NOTE — Transitions of Care (Post Inpatient/ED Visit) (Signed)
   03/26/2023  Name: SHAMEKIA TIPPETS MRN: [id number] DOB: 24-Mar-1980  Today's TOC FU Call Status: Today's TOC FU Call Status:: Unsuccessful Call (1st Attempt) Unsuccessful Call (1st Attempt) Date: 03/26/23  Attempted to reach the patient regarding the most recent Inpatient/ED visit.  Follow Up Plan: Additional outreach attempts will be made to reach the patient to complete the Transitions of Care (Post Inpatient/ED visit) call.   Wyline Mood BSN, RN Medical illustrator, Transition of Care [place] / [facility], Population Health Direct Dial:  [phone] / Fax: [phone] Email:  [email or web address]

## 2023-04-02 ENCOUNTER — Ambulatory Visit: Payer: Medicare Other

## 2023-04-14 ENCOUNTER — Telehealth (HOSPITAL_COMMUNITY): Payer: Self-pay

## 2023-04-14 ENCOUNTER — Non-Acute Institutional Stay (HOSPITAL_COMMUNITY)
Admission: AD | Admit: 2023-04-14 | Discharge: 2023-04-14 | Disposition: A | Discharge status: Home / Self Care | Source: Ambulatory Visit | Attending: Internal Medicine | Admitting: Internal Medicine

## 2023-04-14 DIAGNOSIS — D5700 Hb-SS disease with crisis, unspecified: Secondary | ICD-10-CM | POA: Diagnosis present

## 2023-04-14 DIAGNOSIS — E669 Obesity, unspecified: Secondary | ICD-10-CM | POA: Insufficient documentation

## 2023-04-14 DIAGNOSIS — F1120 Opioid dependence, uncomplicated: Secondary | ICD-10-CM | POA: Diagnosis not present

## 2023-04-14 DIAGNOSIS — G894 Chronic pain syndrome: Secondary | ICD-10-CM | POA: Insufficient documentation

## 2023-04-14 DIAGNOSIS — D638 Anemia in other chronic diseases classified elsewhere: Secondary | ICD-10-CM | POA: Diagnosis not present

## 2023-04-14 LAB — RETICULOCYTES
Immature Retic Fract: 24.2 % — ABNORMAL HIGH (ref 2.3–15.9)
RBC.: 4.57 MIL/uL (ref 3.87–5.11)
Retic Count, Absolute: 137.6 10*3/uL (ref 19.0–186.0)
Retic Ct Pct: 3 % (ref 0.4–3.1)

## 2023-04-14 LAB — COMPREHENSIVE METABOLIC PANEL
ALT: 24 U/L (ref 0–44)
AST: 23 U/L (ref 15–41)
Albumin: 3.8 g/dL (ref 3.5–5.0)
Alkaline Phosphatase: 74 U/L (ref 38–126)
Anion gap: 10 (ref 5–15)
BUN: 10 mg/dL (ref 6–20)
CO2: 25 mmol/L (ref 22–32)
Calcium: 9.4 mg/dL (ref 8.9–10.3)
Chloride: 103 mmol/L (ref 98–111)
Creatinine, Ser: 0.55 mg/dL (ref 0.44–1.00)
GFR, Estimated: >60 mL/min (ref >60)
Glucose, Bld: 106 mg/dL — ABNORMAL HIGH (ref 70–99)
Potassium: 4.2 mmol/L (ref 3.5–5.1)
Sodium: 138 mmol/L (ref 135–145)
Total Bilirubin: 0.8 mg/dL (ref 0.0–1.2)
Total Protein: 7.6 g/dL (ref 6.5–8.1)

## 2023-04-14 LAB — CBC WITH DIFFERENTIAL/PLATELET
Abs Immature Granulocytes: 0.01 10*3/uL (ref 0.00–0.07)
Basophils Absolute: 0 10*3/uL (ref 0.0–0.1)
Basophils Relative: 0 %
Eosinophils Absolute: 0.1 10*3/uL (ref 0.0–0.5)
Eosinophils Relative: 2 %
HCT: 36.3 % (ref 36.0–46.0)
Hemoglobin: 12.1 g/dL (ref 12.0–15.0)
Immature Granulocytes: 0 %
Lymphocytes Relative: 26 %
Lymphs Abs: 2 10*3/uL (ref 0.7–4.0)
MCH: 26.2 pg (ref 26.0–34.0)
MCHC: 33.3 g/dL (ref 30.0–36.0)
MCV: 78.6 fL — ABNORMAL LOW (ref 80.0–100.0)
Monocytes Absolute: 0.6 10*3/uL (ref 0.1–1.0)
Monocytes Relative: 8 %
Neutro Abs: 4.9 10*3/uL (ref 1.7–7.7)
Neutrophils Relative %: 64 %
Platelets: 264 10*3/uL (ref 150–400)
RBC: 4.62 MIL/uL (ref 3.87–5.11)
RDW: 16.6 % — ABNORMAL HIGH (ref 11.5–15.5)
WBC: 7.7 10*3/uL (ref 4.0–10.5)
nRBC: 0.3 % — ABNORMAL HIGH (ref 0.0–0.2)

## 2023-04-14 LAB — LACTATE DEHYDROGENASE: LDH: 144 U/L (ref 98–192)

## 2023-04-14 MED ORDER — POLYETHYLENE GLYCOL 3350 17 G PO PACK: 17.0000 g | PACK | Freq: Every day | ORAL | Status: DC | PRN

## 2023-04-14 MED ORDER — NALOXONE HCL 0.4 MG/ML IJ SOLN: 0.4000 mg | INTRAMUSCULAR | Status: DC | PRN

## 2023-04-14 MED ORDER — ONDANSETRON HCL 4 MG/2ML IJ SOLN
4.0000 mg | INTRAMUSCULAR | Status: DC | PRN
  Administered 2023-04-14: 4 mg via INTRAVENOUS
  Filled 2023-04-14: qty 2

## 2023-04-14 MED ORDER — ACETAMINOPHEN 500 MG PO TABS
1000.0000 mg | ORAL_TABLET | Freq: Once | ORAL | Status: AC
  Administered 2023-04-14: 1000 mg via ORAL
  Filled 2023-04-14: qty 2

## 2023-04-14 MED ORDER — DIPHENHYDRAMINE HCL 25 MG PO CAPS: 25.0000 mg | ORAL_CAPSULE | ORAL | Status: DC | PRN

## 2023-04-14 MED ORDER — SENNOSIDES-DOCUSATE SODIUM 8.6-50 MG PO TABS: 1.0000 | ORAL_TABLET | Freq: Two times a day (BID) | ORAL | Status: DC

## 2023-04-14 MED ORDER — ONDANSETRON HCL 4 MG PO TABS: 4.0000 mg | ORAL_TABLET | ORAL | Status: DC | PRN

## 2023-04-14 MED ORDER — ONDANSETRON HCL 4 MG/2ML IJ SOLN: 4.0000 mg | Freq: Four times a day (QID) | INTRAMUSCULAR | Status: DC | PRN

## 2023-04-14 MED ORDER — HYDROMORPHONE 1 MG/ML IV SOLN
INTRAVENOUS | Status: DC
  Administered 2023-04-14: 11 mg via INTRAVENOUS
  Administered 2023-04-14: 30 mg via INTRAVENOUS
  Filled 2023-04-14: qty 30

## 2023-04-14 MED ORDER — SODIUM CHLORIDE 0.9% FLUSH: 9.0000 mL | INTRAVENOUS | Status: DC | PRN

## 2023-04-14 MED ORDER — SODIUM CHLORIDE 0.45 % IV SOLN: INTRAVENOUS | Status: DC

## 2023-04-14 MED ORDER — KETOROLAC TROMETHAMINE 15 MG/ML IJ SOLN
15.0000 mg | Freq: Four times a day (QID) | INTRAMUSCULAR | Status: DC
  Administered 2023-04-14: 15 mg via INTRAVENOUS
  Filled 2023-04-14: qty 1

## 2023-04-14 NOTE — Progress Notes (Signed)
 Patient admitted to the patient care center for sickle cell pain. Initially, patient rated pain at 8/10. For pain management, patient placed on Sickle Cell Dose Dilaudid PCA, given IV Toradol, PO Tylenol and hydrated with IV fluids. At discharge, patient rated pain at 7/10. Vital signs stable. AVS offered but patient refused. Patient alert, oriented and ambulatory at discharge.

## 2023-04-14 NOTE — Telephone Encounter (Signed)
 Pt called day hospital wanting to come in today for sickle cell pain treatment. Pt reports 8/10 pain to bilateral legs. Pt reports taking Oxycodone 30 mg and Methadone 10 mg at 4 AM. Pt denies being in the ER recently. Pt screened for COVID and denies symptoms and exposures. Pt denies fever, chest pain, N/V/D, and abdominal pain. Lynnell Chad, NP notified and per provider pt can come to the day hospital today for treatment. Pt notified and verbalized understanding. Per pt she will use taxi transportation to get here, but will have a friend pick her up at discharge.

## 2023-04-14 NOTE — Discharge Summary (Signed)
 Physician Discharge Summary  Stephanie Burch NFA:[id number] DOB: 18-Nov-1980 DOA: 04/14/2023  PCP: Quentin Angst, MD  Admit date: 04/14/2023  Discharge date: 04/14/2023  Time spent: 30 minutes  Discharge Diagnoses:  Principal Problem:   Sickle cell anemia with pain [facility])   Discharge Condition: Stable  Diet recommendation: Regular  History of present illness:  Stephanie Burch is a 43 y.o. female with a medical history significant for sickle cell disease, chronic pain syndrome, opiate dependence and tolerance, obesity, and anemia of chronic disease presents with complaints of generalized pain that is consistent with her typical pain crisis. Patient says that pain intensity has been elevated over the past several days and unrelieved by her home medications. Pain is worse in bilateral extremities. Patient last had oxycodone this am without sustained relief. Patient rates pain as 10/10, constant and throbbing. Patient denies fever, chills, chest pain, shortness of breath, nausea, vomiting, or diarrhea. No sick contacts or recent travel. She attributes pain to changes in weather.   Hospital Course:  Stephanie Burch was admitted to the day hospital with sickle cell painful crisis. Patient was treated with IV fluid, weight based IV Dilaudid PCA, IV Toradol, clinician assisted doses as deemed appropriate, and other adjunct therapies per sickle cell pain management protocol. Stephanie Burch showed significant improvement symptomatically, pain improved from 10/10 to 7/10 at the time of discharge. Patient was discharged home in a hemodynamically stable condition. Stephanie Burch will follow-up at the clinic as previously scheduled, continue with home medications as per prior to admission.  Discharge Instructions We discussed the need for good hydration, monitoring of hydration status, avoidance of heat, cold, stress, and infection triggers. We discussed the need to be compliant with taking home medications. Stephanie Burch  was reminded of the need to seek medical attention immediately if any symptom of bleeding, anemia, or infection occurs.  Discharge Exam: Vitals:   04/14/23 1108 04/14/23 1309  BP:  121/67  Pulse:  93  Resp: 15 14  Temp:    SpO2:  94%    General appearance: alert, cooperative and no distress Eyes: conjunctivae/corneas clear. PERRL, EOM's intact. Fundi benign. Neck: no adenopathy, no carotid bruit, no JVD, supple, symmetrical, trachea midline and thyroid not enlarged, symmetric, no tenderness/mass/nodules Back: symmetric, no curvature. ROM normal. No CVA tenderness. Resp: clear to auscultation bilaterally Chest wall: no tenderness Cardio: regular rate and rhythm, S1, S2 normal, no murmur, click, rub or gallop GI: soft, non-tender; bowel sounds normal; no masses,  no organomegaly Extremities: extremities normal, atraumatic, no cyanosis or edema Pulses: 2+ and symmetric Skin: Skin color, texture, turgor normal. No rashes or lesions Neurologic: Grossly normal   Allergies as of 04/14/2023   No Known Allergies   Med Rec must be completed prior to using this SMARTLINK***      No Known Allergies   Significant Diagnostic Studies: No results found.  Signed:  Daryll Drown NP  04/14/2023, 3:22 PM

## 2023-04-14 NOTE — Progress Notes (Cosign Needed Addendum)
 Patient ID: Stephanie Burch, female   DOB: 10/12/1980, 43 y.o.   MRN: [id number] [facility] History and Physical  Stephanie Burch IRS:[id number] DOB: [birth year] DOA: 04/14/2023  PCP: Stephanie Angst, MD   Chief Complaint:  Chief Complaint  Patient presents with   Sickle Cell Pain Crisis    HPI: Stephanie Burch is a 43 y.o. female with a medical history significant for sickle cell disease, chronic pain syndrome, opiate dependence and tolerance, obesity, and anemia of chronic disease presents with complaints of generalized pain that is consistent with her typical pain crisis. Patient says that pain intensity has been elevated over the past several days and unrelieved by her home medications. Pain is worse in bilateral extremities. Patient last had oxycodone this am without sustained relief. Patient rates pain as 10/10, constant and throbbing. Patient denies fever, chills, chest pain, shortness of breath, nausea, vomiting, or diarrhea. No sick contacts or recent travel. She attributes pain to changes in weather.    Systemic Review: General: The patient denies anorexia, fever, weight loss Cardiac: Denies chest pain, syncope, palpitations, pedal edema  Respiratory: Denies cough, shortness of breath, wheezing GI: Denies severe indigestion/heartburn, abdominal pain, nausea, vomiting, diarrhea and constipation GU: Denies hematuria, incontinence, dysuria  Musculoskeletal: Denies arthritis, generalize body pain,  Bilateral lower leg pain  Skin: Denies suspicious skin lesions Neurologic: Denies focal weakness or numbness, change in vision  Past Medical History:  Diagnosis Date   Acute kidney injury (HCC) 11/07/2019   Anemia    Chronic pain    COVID-19 virus detected 10/22/2019   Depression    HCAP (healthcare-associated pneumonia) 11/07/2019   History of blood transfusion 2013   x 2   HSV infection    Hypoxia    Pneumonia    x 1   Pulmonary edema 11/11/2019   Sepsis (HCC)  11/07/2019   Sickle cell anemia (HCC) 09/2021   gets them frequently   Tachycardia with heart rate 100-120 beats per minute    Vitamin B12 deficiency 12/2018   Vitamin D deficiency     Past Surgical History:  Procedure Laterality Date   CESAREAN SECTION  2013   x 1   TOOTH EXTRACTION N/A 10/18/2021   Procedure: DENTAL RESTORATION/EXTRACTIONS;  Surgeon: Ocie Doyne, DMD;  Location: [facility] OR;  Service: Oral Surgery;  Laterality: N/A;    No Known Allergies  Family History  Problem Relation Age of Onset   Hypertension Mother    Sickle cell trait Mother    Diabetes Father    Sickle cell trait Father       Prior to Admission medications   Medication Sig Start Date End Date Taking? Authorizing Provider  acetaminophen (TYLENOL) 500 MG tablet Take 1,000 mg by mouth every 6 (six) hours as needed for mild pain, moderate pain or headache.   Yes [provider]  folic acid (FOLVITE) 1 MG tablet Take 1 mg by mouth daily.   Yes [provider]  ibuprofen (ADVIL) 800 MG tablet Take 1 tablet (800 mg total) by mouth every 8 (eight) hours as needed. 12/22/22  Yes Paseda, Baird Kay, FNP  ondansetron (ZOFRAN) 4 MG tablet Take by mouth. 02/28/22  Yes [provider]  oxycodone (ROXICODONE) 30 MG immediate release tablet Take 30 mg by mouth every 4 (four) hours.   Yes [provider]  naloxone [facility]) nasal spray 4 mg/0.1 mL Use as needed for opiod overdose 03/04/22   Donell Beers, FNP  Vitamin D,  Ergocalciferol, (DRISDOL) 1.25 MG (50000 UNIT) CAPS capsule Take 1 capsule (50,000 Units total) by mouth every 7 (seven) days. Patient not taking: Reported on 01/12/2023 03/04/22   Donell Beers, FNP     Physical Exam: Vitals:   04/14/23 1106 04/14/23 1108 04/14/23 1309  BP: 123/75  121/67  Pulse: (!) 105  93  Resp: 15 15 14   Temp: 97.9 F (36.6 C)    TempSrc: Temporal    SpO2: 94%  94%    General: Alert, awake, afebrile, anicteric, not in obvious  distress HEENT: Normocephalic and Atraumatic, Mucous membranes pink                PERRLA; EOM intact; No scleral icterus,                 Nares: Patent, Oropharynx: Clear, Fair Dentition                 Neck: FROM, no cervical lymphadenopathy, thyromegaly, carotid bruit or JVD;  CHEST WALL: No tenderness  CHEST: Normal respiration, clear to auscultation bilaterally  HEART: Regular rate and rhythm; no murmurs rubs or gallops  BACK: No kyphosis or scoliosis; no CVA tenderness  ABDOMEN: Positive Bowel Sounds, soft, non-tender; no masses, no organomegaly EXTREMITIES: No cyanosis, clubbing, or edema SKIN:  no rash or ulceration  CNS: Alert and Oriented x 4, Nonfocal exam, CN 2-12 intact  Labs on Admission:  Basic Metabolic Panel: Recent Labs  Lab 04/14/23 1045  NA 138  K 4.2  CL 103  CO2 25  GLUCOSE 106*  BUN 10  CREATININE 0.55  CALCIUM 9.4   Liver Function Tests: Recent Labs  Lab 04/14/23 1045  AST 23  ALT 24  ALKPHOS 74  BILITOT 0.8  PROT 7.6  ALBUMIN 3.8   No results for input(s): "LIPASE", "AMYLASE" in the last 168 hours. No results for input(s): "AMMONIA" in the last 168 hours. CBC: Recent Labs  Lab 04/14/23 1045  WBC 7.7  NEUTROABS 4.9  HGB 12.1  HCT 36.3  MCV 78.6*  PLT 264   Cardiac Enzymes: No results for input(s): "CKTOTAL", "CKMB", "CKMBINDEX", "TROPONINI" in the last 168 hours.  BNP (last 3 results) No results for input(s): "BNP" in the last 8760 hours.  ProBNP (last 3 results) No results for input(s): "PROBNP" in the last 8760 hours.  CBG: No results for input(s): "GLUCAP" in the last 168 hours.   Assessment/Plan Principal Problem:   Sickle cell anemia with pain (HCC)  Admits to the Day Hospital for extended observation IVF 0.45% Saline @100  mls/hour Weight based Dilaudid PCA started within 30 minutes of admission IV Toradol 15 mg once Acetaminophen 1000 mg x 1 dose Labs: CBCD, CMP, Retic Count and LDH Monitor vitals very closely,  Re-evaluate pain scale every hour 2 L of Oxygen by [place] Patient will be re-evaluated for pain in the context of function and relationship to baseline as care progresses. If no significant relieve from pain (remains above 5/10) will transfer patient to inpatient services for further evaluation and management  Code Status: Full  Family Communication: None  DVT Prophylaxis: Ambulate as tolerated   Time spent: 35 Minutes  Daryll Drown NP  If 7PM-7AM, please contact night-coverage [email or web address] 04/14/2023, 2:02 PM

## 2023-04-15 ENCOUNTER — Encounter (HOSPITAL_COMMUNITY): Payer: Self-pay | Admitting: Internal Medicine

## 2023-04-15 ENCOUNTER — Telehealth (HOSPITAL_COMMUNITY): Payer: Self-pay

## 2023-04-15 NOTE — Telephone Encounter (Signed)
 Pt called day hospital wanting to come in today for sickle cell pain treatment. Pt reports 9/10 pain to bilateral legs. Pt reports taking Oxycodone 30 mg at 6 AM. Pt denies being to the ER recently. Pt was seen in the day hospital yesterday. Pt screened for COVID and denies symptoms and exposures. Pt denies fever, chest pain, N/V/D, abdominal pain. [name],  NP notified and per provider pt is unable to come to the day hospital today. Per provider pt should go to the [facility] ER, and provider can be consulted from there if needed. Pt notified and verbalized understanding.

## 2023-04-16 ENCOUNTER — Encounter (HOSPITAL_COMMUNITY): Payer: Self-pay | Admitting: Internal Medicine

## 2023-04-16 ENCOUNTER — Telehealth (HOSPITAL_COMMUNITY): Payer: Self-pay

## 2023-04-16 ENCOUNTER — Non-Acute Institutional Stay (HOSPITAL_COMMUNITY)
Admission: AD | Admit: 2023-04-16 | Discharge: 2023-04-16 | Disposition: A | Discharge status: Home / Self Care | Source: Ambulatory Visit | Attending: Internal Medicine | Admitting: Internal Medicine

## 2023-04-16 DIAGNOSIS — D638 Anemia in other chronic diseases classified elsewhere: Secondary | ICD-10-CM | POA: Insufficient documentation

## 2023-04-16 DIAGNOSIS — E669 Obesity, unspecified: Secondary | ICD-10-CM | POA: Insufficient documentation

## 2023-04-16 DIAGNOSIS — D5700 Hb-SS disease with crisis, unspecified: Secondary | ICD-10-CM | POA: Insufficient documentation

## 2023-04-16 DIAGNOSIS — Z79891 Long term (current) use of opiate analgesic: Secondary | ICD-10-CM | POA: Insufficient documentation

## 2023-04-16 DIAGNOSIS — G894 Chronic pain syndrome: Secondary | ICD-10-CM | POA: Insufficient documentation

## 2023-04-16 LAB — RETICULOCYTES
Immature Retic Fract: 30.3 % — ABNORMAL HIGH (ref 2.3–15.9)
RBC.: 4.53 MIL/uL (ref 3.87–5.11)
Retic Count, Absolute: 129.1 10*3/uL (ref 19.0–186.0)
Retic Ct Pct: 2.9 % (ref 0.4–3.1)

## 2023-04-16 LAB — COMPREHENSIVE METABOLIC PANEL
ALT: 22 U/L (ref 0–44)
AST: 18 U/L (ref 15–41)
Albumin: 3.9 g/dL (ref 3.5–5.0)
Alkaline Phosphatase: 77 U/L (ref 38–126)
Anion gap: 7 (ref 5–15)
BUN: 9 mg/dL (ref 6–20)
CO2: 26 mmol/L (ref 22–32)
Calcium: 9.2 mg/dL (ref 8.9–10.3)
Chloride: 106 mmol/L (ref 98–111)
Creatinine, Ser: 0.67 mg/dL (ref 0.44–1.00)
GFR, Estimated: >60 mL/min (ref >60)
Glucose, Bld: 109 mg/dL — ABNORMAL HIGH (ref 70–99)
Potassium: 4.1 mmol/L (ref 3.5–5.1)
Sodium: 139 mmol/L (ref 135–145)
Total Bilirubin: 1 mg/dL (ref 0.0–1.2)
Total Protein: 8 g/dL (ref 6.5–8.1)

## 2023-04-16 LAB — CBC WITH DIFFERENTIAL/PLATELET
Abs Immature Granulocytes: 0.02 10*3/uL (ref 0.00–0.07)
Basophils Absolute: 0 10*3/uL (ref 0.0–0.1)
Basophils Relative: 1 %
Eosinophils Absolute: 0.1 10*3/uL (ref 0.0–0.5)
Eosinophils Relative: 1 %
HCT: 34.6 % — ABNORMAL LOW (ref 36.0–46.0)
Hemoglobin: 11.9 g/dL — ABNORMAL LOW (ref 12.0–15.0)
Immature Granulocytes: 0 %
Lymphocytes Relative: 31 %
Lymphs Abs: 2 10*3/uL (ref 0.7–4.0)
MCH: 26.6 pg (ref 26.0–34.0)
MCHC: 34.4 g/dL (ref 30.0–36.0)
MCV: 77.4 fL — ABNORMAL LOW (ref 80.0–100.0)
Monocytes Absolute: 0.6 10*3/uL (ref 0.1–1.0)
Monocytes Relative: 9 %
Neutro Abs: 3.9 10*3/uL (ref 1.7–7.7)
Neutrophils Relative %: 58 %
Platelets: 234 10*3/uL (ref 150–400)
RBC: 4.47 MIL/uL (ref 3.87–5.11)
RDW: 15.9 % — ABNORMAL HIGH (ref 11.5–15.5)
WBC: 6.6 10*3/uL (ref 4.0–10.5)
nRBC: 0.5 % — ABNORMAL HIGH (ref 0.0–0.2)

## 2023-04-16 LAB — LACTATE DEHYDROGENASE: LDH: 140 U/L (ref 98–192)

## 2023-04-16 MED ORDER — DIPHENHYDRAMINE HCL 25 MG PO CAPS
25.0000 mg | ORAL_CAPSULE | ORAL | Status: DC | PRN
  Administered 2023-04-16: 25 mg via ORAL
  Filled 2023-04-16: qty 1

## 2023-04-16 MED ORDER — POLYETHYLENE GLYCOL 3350 17 G PO PACK: 17.0000 g | PACK | Freq: Every day | ORAL | Status: DC | PRN

## 2023-04-16 MED ORDER — NALOXONE HCL 0.4 MG/ML IJ SOLN
0.4000 mg | INTRAMUSCULAR | Status: DC | PRN
Start: 2023-04-16 — End: 2023-04-16

## 2023-04-16 MED ORDER — ONDANSETRON HCL 4 MG/2ML IJ SOLN: 4.0000 mg | Freq: Four times a day (QID) | INTRAMUSCULAR | Status: DC | PRN

## 2023-04-16 MED ORDER — ONDANSETRON HCL 4 MG PO TABS: 4.0000 mg | ORAL_TABLET | ORAL | Status: DC | PRN

## 2023-04-16 MED ORDER — SENNOSIDES-DOCUSATE SODIUM 8.6-50 MG PO TABS: 1.0000 | ORAL_TABLET | Freq: Two times a day (BID) | ORAL | Status: DC

## 2023-04-16 MED ORDER — DIPHENHYDRAMINE HCL 25 MG PO CAPS: 25.0000 mg | ORAL_CAPSULE | ORAL | Status: DC | PRN

## 2023-04-16 MED ORDER — KETOROLAC TROMETHAMINE 15 MG/ML IJ SOLN
15.0000 mg | Freq: Once | INTRAMUSCULAR | Status: AC
  Administered 2023-04-16: 15 mg via INTRAVENOUS
  Filled 2023-04-16: qty 1

## 2023-04-16 MED ORDER — SODIUM CHLORIDE 0.9% FLUSH: 9.0000 mL | INTRAVENOUS | Status: DC | PRN

## 2023-04-16 MED ORDER — ONDANSETRON HCL 4 MG/2ML IJ SOLN
4.0000 mg | INTRAMUSCULAR | Status: DC | PRN
Start: 2023-04-16 — End: 2023-04-16
  Administered 2023-04-16: 4 mg via INTRAVENOUS
  Filled 2023-04-16: qty 2

## 2023-04-16 MED ORDER — SODIUM CHLORIDE 0.45 % IV SOLN: INTRAVENOUS | Status: DC

## 2023-04-16 MED ORDER — HYDROMORPHONE 1 MG/ML IV SOLN
INTRAVENOUS | Status: DC
  Administered 2023-04-16: 13 mg via INTRAVENOUS
  Administered 2023-04-16: 30 mg via INTRAVENOUS
  Filled 2023-04-16: qty 30

## 2023-04-16 MED ORDER — ACETAMINOPHEN 500 MG PO TABS
1000.0000 mg | ORAL_TABLET | Freq: Once | ORAL | Status: DC
  Filled 2023-04-16: qty 2

## 2023-04-16 NOTE — H&P (Signed)
 [facility] History and Physical  Stephanie Burch WGN:[id number] DOB: 28-Aug-1980 DOA: 04/16/2023  PCP: Quentin Angst, MD   Chief Complaint:  Chief Complaint  Patient presents with   Sickle Cell Pain Crisis    HPI: Stephanie Burch is a 43 y.o. female  with a medical history significant for sickle cell disease, chronic pain syndrome, opiate dependence and tolerance, obesity, and anemia of chronic disease presents with complaints of generalized pain that is consistent with her typical pain crisis. Patient says that pain intensity has been elevated over the past several days and unrelieved by her home medications. Pain is worse in bilateral extremities. Patient last had oxycodone this am without sustained relief. Patient rates pain as 7/10, constant and throbbing. Patient was treated for the same pain symptoms on 04/14/2023 at the day clinic. Patient denies fever, chills, chest pain, shortness of breath, nausea, vomiting, or diarrhea. No sick contacts or recent travel. She attributes pain to changes in weather.   Systemic Review: General: The patient denies anorexia, fever, weight loss Cardiac: Denies chest pain, syncope, palpitations, pedal edema  Respiratory: Denies cough, shortness of breath, wheezing GI: Denies severe indigestion/heartburn, abdominal pain, nausea, vomiting, diarrhea and constipation GU: Denies hematuria, incontinence, dysuria  Musculoskeletal: Denies arthritis  Skin: Denies suspicious skin lesions Neurologic: Denies focal weakness or numbness, change in vision  Past Medical History:  Diagnosis Date   Acute kidney injury (HCC) 11/07/2019   Anemia    Chronic pain    COVID-19 virus detected 10/22/2019   Depression    HCAP (healthcare-associated pneumonia) 11/07/2019   History of blood transfusion 2013   x 2   HSV infection    Hypoxia    Pneumonia    x 1   Pulmonary edema 11/11/2019   Sepsis (HCC) 11/07/2019   Sickle cell anemia (HCC) 09/2021    gets them frequently   Tachycardia with heart rate 100-120 beats per minute    Vitamin B12 deficiency 12/2018   Vitamin D deficiency     Past Surgical History:  Procedure Laterality Date   CESAREAN SECTION  2013   x 1   TOOTH EXTRACTION N/A 10/18/2021   Procedure: DENTAL RESTORATION/EXTRACTIONS;  Surgeon: Ocie Doyne, DMD;  Location: [facility] OR;  Service: Oral Surgery;  Laterality: N/A;    No Known Allergies  Family History  Problem Relation Age of Onset   Hypertension Mother    Sickle cell trait Mother    Diabetes Father    Sickle cell trait Father       Prior to Admission medications   Medication Sig Start Date End Date Taking? Authorizing Provider  acetaminophen (TYLENOL) 500 MG tablet Take 1,000 mg by mouth every 6 (six) hours as needed for mild pain, moderate pain or headache.   Yes [provider]  folic acid (FOLVITE) 1 MG tablet Take 1 mg by mouth daily.   Yes [provider]  ondansetron (ZOFRAN) 4 MG tablet Take by mouth. 02/28/22  Yes [provider]  oxycodone (ROXICODONE) 30 MG immediate release tablet Take 30 mg by mouth every 4 (four) hours.   Yes [provider]  ibuprofen (ADVIL) 800 MG tablet Take 1 tablet (800 mg total) by mouth every 8 (eight) hours as needed. 12/22/22   Donell Beers, FNP  naloxone [facility]) nasal spray 4 mg/0.1 mL Use as needed for opiod overdose 03/04/22   Paseda, Phillips Grout R, FNP  Vitamin D, Ergocalciferol, (DRISDOL) 1.25 MG (50000 UNIT) CAPS capsule Take 1  capsule (50,000 Units total) by mouth every 7 (seven) days. Patient not taking: Reported on 01/12/2023 03/04/22   Donell Beers, FNP     Physical Exam: Vitals:   04/16/23 1031 04/16/23 1118 04/16/23 1223  BP: (!) 142/103  122/82  Pulse: 91  80  Resp: 16 16 16   Temp: (!) 97.5 F (36.4 C)    TempSrc: Temporal    SpO2: 96%  95%    General: Alert, awake, afebrile, anicteric, not in obvious distress HEENT: Normocephalic and Atraumatic,  Mucous membranes pink                PERRLA; EOM intact; No scleral icterus,                 Nares: Patent, Oropharynx: Clear, Fair Dentition                 Neck: FROM, no cervical lymphadenopathy, thyromegaly, carotid bruit or JVD;  CHEST WALL: No tenderness  CHEST: Normal respiration, clear to auscultation bilaterally  HEART: Regular rate and rhythm; no murmurs rubs or gallops  BACK: No kyphosis or scoliosis; no CVA tenderness  ABDOMEN: Positive Bowel Sounds, soft, non-tender; no masses, no organomegaly EXTREMITIES: No cyanosis, clubbing, or edema Musculoskeletal: Lower back pain, bilateral lower leg pain SKIN:  no rash or ulceration  CNS: Alert and Oriented x 4, Nonfocal exam, CN 2-12 intact  Labs on Admission:  Basic Metabolic Panel: Recent Labs  Lab 04/14/23 1045 04/16/23 1025  NA 138 139  K 4.2 4.1  CL 103 106  CO2 25 26  GLUCOSE 106* 109*  BUN 10 9  CREATININE 0.55 0.67  CALCIUM 9.4 9.2   Liver Function Tests: Recent Labs  Lab 04/14/23 1045 04/16/23 1025  AST 23 18  ALT 24 22  ALKPHOS 74 77  BILITOT 0.8 1.0  PROT 7.6 8.0  ALBUMIN 3.8 3.9   No results for input(s): "LIPASE", "AMYLASE" in the last 168 hours. No results for input(s): "AMMONIA" in the last 168 hours. CBC: Recent Labs  Lab 04/14/23 1045 04/16/23 1025  WBC 7.7 6.6  NEUTROABS 4.9 3.9  HGB 12.1 11.9*  HCT 36.3 34.6*  MCV 78.6* 77.4*  PLT 264 234   Cardiac Enzymes: No results for input(s): "CKTOTAL", "CKMB", "CKMBINDEX", "TROPONINI" in the last 168 hours.  BNP (last 3 results) No results for input(s): "BNP" in the last 8760 hours.  ProBNP (last 3 results) No results for input(s): "PROBNP" in the last 8760 hours.  CBG: No results for input(s): "GLUCAP" in the last 168 hours.   Assessment/Plan Principal Problem:   Sickle cell anemia with pain (HCC)  Admits to the Day Hospital for extended observation IVF 0.45% Saline @ 100 mls/hour Weight based Dilaudid PCA started within 30  minutes of admission IV Toradol 15 mg x 1 doses Acetaminophen 1000 mg x 1 dose Labs: CBCD, CMP, Retic Count and LDH Monitor vitals very closely, Re-evaluate pain scale every hour 2 L of Oxygen by [place] Patient will be re-evaluated for pain in the context of function and relationship to baseline as care progresses. If no significant relieve from pain (remains above 5/10) will transfer patient to inpatient services for further evaluation and management  Code Status: Full  Family Communication: None  DVT Prophylaxis: Ambulate as tolerated   Time spent: 68 Minutes  Tiziana Cislo Rebecka Apley  If 7PM-7AM, please contact night-coverage [email or web address] 04/16/2023, 1:50 PM

## 2023-04-16 NOTE — Discharge Summary (Cosign Needed Addendum)
 Physician Discharge Summary  Stephanie Burch:[id number] DOB: [birth year] DOA: 04/16/2023  PCP: Quentin Angst, MD  Admit date: 04/16/2023  Discharge date: 04/16/2023  Time spent: 30 minutes  Discharge Diagnoses:  Principal Problem:   Sickle cell anemia with pain [facility])   Discharge Condition: Stable  Diet recommendation: Regular  History of present illness:  Stephanie Burch is a 43 y.o. female with a medical history significant for sickle cell disease, chronic pain syndrome, opiate dependence and tolerance, obesity, and anemia of chronic disease presents with complaints of generalized pain that is consistent with her typical pain crisis. Patient says that pain intensity has been elevated over the past several days and unrelieved by her home medications. Pain is worse in bilateral extremities. Patient last had oxycodone this am without sustained relief. Patient rates pain as 7/10, constant and throbbing. Patient denies fever, chills, chest pain, shortness of breath, nausea, vomiting, or diarrhea. No sick contacts or recent travel. She attributes pain to changes in weather.   Hospital Course:  Stephanie Burch was admitted to the day hospital with sickle cell painful crisis. Patient was treated with IV fluid, weight based IV Dilaudid PCA, IV Toradol, clinician assisted doses as deemed appropriate, and other adjunct therapies per sickle cell pain management protocol. Stephanie Burch showed significant improvement symptomatically, pain improved from 7/10 to 5/10 at the time of discharge. Patient was discharged home in a hemodynamically stable condition. Stephanie Burch will follow-up at the clinic as previously scheduled, continue with home medications as per prior to admission.  Discharge Instructions We discussed the need for good hydration, monitoring of hydration status, avoidance of heat, cold, stress, and infection triggers. We discussed the need to be compliant with taking other home medications.  Stephanie Burch was reminded of the need to seek medical attention immediately if any symptom of bleeding, anemia, or infection occurs.  Discharge Exam: Vitals:   04/16/23 1223 04/16/23 1436  BP: 122/82 (!) 128/90  Pulse: 80 81  Resp: 16 10  Temp:    SpO2: 95% 98%    General appearance: alert, cooperative and no distress Eyes: conjunctivae/corneas clear. PERRL, EOM's intact. Fundi benign. Neck: no adenopathy, no carotid bruit, no JVD, supple, symmetrical, trachea midline and thyroid not enlarged, symmetric, no tenderness/mass/nodules Back: symmetric, no curvature. ROM normal. No CVA tenderness. Resp: clear to auscultation bilaterally Chest wall: no tenderness Cardio: regular rate and rhythm, S1, S2 normal, no murmur, click, rub or gallop GI: soft, non-tender; bowel sounds normal; no masses,  no organomegaly Extremities: extremities normal, atraumatic, no cyanosis or edema Pulses: 2+ and symmetric  Skin: Skin color, texture, turgor normal. No rashes or lesions Neurologic: Grossly normal  Discharge Instructions     Diet - low sodium heart healthy   Complete by: As directed    Increase activity slowly   Complete by: As directed       Allergies as of 04/16/2023   No Known Allergies      Medication List     TAKE these medications    acetaminophen 500 MG tablet Commonly known as: TYLENOL Take 1,000 mg by mouth every 6 (six) hours as needed for mild pain, moderate pain or headache.   folic acid 1 MG tablet Commonly known as: FOLVITE Take 1 mg by mouth daily.   ibuprofen 800 MG tablet Commonly known as: ADVIL Take 1 tablet (800 mg total) by mouth every 8 (eight) hours as needed.   naloxone 4 MG/0.1ML Liqd nasal spray kit Commonly known as: NARCAN Use as  needed for opiod overdose   ondansetron 4 MG tablet Commonly known as: ZOFRAN Take by mouth.   oxycodone 30 MG immediate release tablet Commonly known as: ROXICODONE Take 30 mg by mouth every 4 (four) hours.    Vitamin D (Ergocalciferol) 1.25 MG (50000 UNIT) Caps capsule Commonly known as: DRISDOL Take 1 capsule (50,000 Units total) by mouth every 7 (seven) days.       No Known Allergies   Significant Diagnostic Studies: No results found.  Signed:  Daryll Drown NP  04/16/2023, 2:39 PM

## 2023-04-16 NOTE — Progress Notes (Signed)
 Patient admitted to the day hospital for sickle cell pain. Initially, patient reported bilateral leg pain rated 8/10. For pain management, patient placed on Sickle Cell Dose Dilaudid PCA, given IV Toradol, PO Tylenol and hydrated with IV fluids. At discharge, patient rated pain at 6/10. Vital signs stable. Printed AVS offered but patient refused. Patient alert, oriented and ambulatory at discharge.

## 2023-04-16 NOTE — Telephone Encounter (Signed)
 Pt called day hospital today wanting to come in for sickle cell pain treatment. Pt reports 7/10 to legs. Pt reports taking Oxycodone 30 mg at 6 AM. Pt denies being to the ER recently. Pt was seen in the day hospital on Tuesday. Pt screened for COVID and denies symptoms and exposures, Pt denies fever, chest pain, N/V/D, abdominal pain, and chest pain. [name], NP notified and per provider pt can come to the day hospital today. Pt notified and verbalized understanding. Pt states that she has transportation without driving herself today.

## 2023-04-19 ENCOUNTER — Other Ambulatory Visit: Payer: Self-pay

## 2023-04-19 ENCOUNTER — Inpatient Hospital Stay (HOSPITAL_COMMUNITY)
Admission: EM | Admit: 2023-04-19 | Discharge: 2023-04-22 | DRG: 812 | Disposition: A | Discharge status: Home / Self Care | Attending: Internal Medicine | Admitting: Internal Medicine

## 2023-04-19 ENCOUNTER — Encounter (HOSPITAL_COMMUNITY): Payer: Self-pay

## 2023-04-19 DIAGNOSIS — Z8249 Family history of ischemic heart disease and other diseases of the circulatory system: Secondary | ICD-10-CM

## 2023-04-19 DIAGNOSIS — Z6841 Body Mass Index (BMI) 40.0 and over, adult: Secondary | ICD-10-CM

## 2023-04-19 DIAGNOSIS — D638 Anemia in other chronic diseases classified elsewhere: Secondary | ICD-10-CM | POA: Diagnosis present

## 2023-04-19 DIAGNOSIS — Z8616 Personal history of COVID-19: Secondary | ICD-10-CM | POA: Diagnosis not present

## 2023-04-19 DIAGNOSIS — Z79899 Other long term (current) drug therapy: Secondary | ICD-10-CM | POA: Diagnosis not present

## 2023-04-19 DIAGNOSIS — D5700 Hb-SS disease with crisis, unspecified: Principal | ICD-10-CM | POA: Diagnosis present

## 2023-04-19 DIAGNOSIS — Z833 Family history of diabetes mellitus: Secondary | ICD-10-CM | POA: Diagnosis not present

## 2023-04-19 DIAGNOSIS — Z832 Family history of diseases of the blood and blood-forming organs and certain disorders involving the immune mechanism: Secondary | ICD-10-CM

## 2023-04-19 DIAGNOSIS — G894 Chronic pain syndrome: Secondary | ICD-10-CM | POA: Diagnosis present

## 2023-04-19 LAB — CBC WITH DIFFERENTIAL/PLATELET
Abs Immature Granulocytes: 0.02 10*3/uL (ref 0.00–0.07)
Basophils Absolute: 0 10*3/uL (ref 0.0–0.1)
Basophils Relative: 0 %
Eosinophils Absolute: 0.1 10*3/uL (ref 0.0–0.5)
Eosinophils Relative: 1 %
HCT: 34.4 % — ABNORMAL LOW (ref 36.0–46.0)
Hemoglobin: 11.8 g/dL — ABNORMAL LOW (ref 12.0–15.0)
Immature Granulocytes: 0 %
Lymphocytes Relative: 26 %
Lymphs Abs: 1.8 10*3/uL (ref 0.7–4.0)
MCH: 26.2 pg (ref 26.0–34.0)
MCHC: 34.3 g/dL (ref 30.0–36.0)
MCV: 76.4 fL — ABNORMAL LOW (ref 80.0–100.0)
Monocytes Absolute: 0.6 10*3/uL (ref 0.1–1.0)
Monocytes Relative: 9 %
Neutro Abs: 4.4 10*3/uL (ref 1.7–7.7)
Neutrophils Relative %: 64 %
Platelets: 242 10*3/uL (ref 150–400)
RBC: 4.5 MIL/uL (ref 3.87–5.11)
RDW: 15.6 % — ABNORMAL HIGH (ref 11.5–15.5)
WBC: 6.9 10*3/uL (ref 4.0–10.5)
nRBC: 0 % (ref 0.0–0.2)

## 2023-04-19 LAB — COMPREHENSIVE METABOLIC PANEL
ALT: 16 U/L (ref 0–44)
AST: 15 U/L (ref 15–41)
Albumin: 3.7 g/dL (ref 3.5–5.0)
Alkaline Phosphatase: 70 U/L (ref 38–126)
Anion gap: 7 (ref 5–15)
BUN: 7 mg/dL (ref 6–20)
CO2: 24 mmol/L (ref 22–32)
Calcium: 9.2 mg/dL (ref 8.9–10.3)
Chloride: 107 mmol/L (ref 98–111)
Creatinine, Ser: 0.7 mg/dL (ref 0.44–1.00)
GFR, Estimated: >60 mL/min (ref >60)
Glucose, Bld: 107 mg/dL — ABNORMAL HIGH (ref 70–99)
Potassium: 3.5 mmol/L (ref 3.5–5.1)
Sodium: 138 mmol/L (ref 135–145)
Total Bilirubin: 0.8 mg/dL (ref 0.0–1.2)
Total Protein: 7.8 g/dL (ref 6.5–8.1)

## 2023-04-19 LAB — URINALYSIS, ROUTINE W REFLEX MICROSCOPIC
Bilirubin Urine: NEGATIVE
Glucose, UA: NEGATIVE mg/dL
Hgb urine dipstick: NEGATIVE
Ketones, ur: NEGATIVE mg/dL
Leukocytes,Ua: NEGATIVE
Nitrite: NEGATIVE
Protein, ur: NEGATIVE mg/dL
Specific Gravity, Urine: 1.012 (ref 1.005–1.030)
pH: 5 (ref 5.0–8.0)

## 2023-04-19 LAB — RETICULOCYTES
Immature Retic Fract: 31.2 % — ABNORMAL HIGH (ref 2.3–15.9)
RBC.: 4.52 MIL/uL (ref 3.87–5.11)
Retic Count, Absolute: 124.8 10*3/uL (ref 19.0–186.0)
Retic Ct Pct: 2.8 % (ref 0.4–3.1)

## 2023-04-19 LAB — HCG, SERUM, QUALITATIVE: Preg, Serum: NEGATIVE

## 2023-04-19 LAB — HIV ANTIBODY (ROUTINE TESTING W REFLEX): HIV Screen 4th Generation wRfx: NONREACTIVE

## 2023-04-19 MED ORDER — HYDROMORPHONE 1 MG/ML IV SOLN
INTRAVENOUS | Status: DC
  Administered 2023-04-19: 8 mg via INTRAVENOUS
  Administered 2023-04-19: 10.5 mg via INTRAVENOUS
  Administered 2023-04-19 – 2023-04-20 (×3): 30 mg via INTRAVENOUS
  Administered 2023-04-20: 9.5 mg via INTRAVENOUS
  Administered 2023-04-20: 10 mg via INTRAVENOUS
  Administered 2023-04-20: 30 mg via INTRAVENOUS
  Administered 2023-04-20: 9 mg via INTRAVENOUS
  Administered 2023-04-21: 10.5 mg via INTRAVENOUS
  Administered 2023-04-21: 9.5 mg via INTRAVENOUS
  Administered 2023-04-21: 8.5 mg via INTRAVENOUS
  Administered 2023-04-21: 10 mg via INTRAVENOUS
  Administered 2023-04-21: 30 mg via INTRAVENOUS
  Administered 2023-04-21: 7.9 mg via INTRAVENOUS
  Administered 2023-04-21: 30 mg via INTRAVENOUS
  Administered 2023-04-22: 10.5 mg via INTRAVENOUS
  Administered 2023-04-22 (×2): 6 mg via INTRAVENOUS
  Administered 2023-04-22: 7.5 mg via INTRAVENOUS
  Administered 2023-04-22: 30 mg via INTRAVENOUS
  Administered 2023-04-22: 6 mg via INTRAVENOUS
  Filled 2023-04-19 (×7): qty 30

## 2023-04-19 MED ORDER — FOLIC ACID 1 MG PO TABS
1.0000 mg | ORAL_TABLET | Freq: Every day | ORAL | Status: DC
  Administered 2023-04-19 – 2023-04-22 (×4): 1 mg via ORAL
  Filled 2023-04-19 (×4): qty 1

## 2023-04-19 MED ORDER — POLYETHYLENE GLYCOL 3350 17 G PO PACK: 17.0000 g | PACK | Freq: Every day | ORAL | Status: DC | PRN

## 2023-04-19 MED ORDER — KETOROLAC TROMETHAMINE 15 MG/ML IJ SOLN
15.0000 mg | Freq: Four times a day (QID) | INTRAMUSCULAR | Status: DC
  Administered 2023-04-19 – 2023-04-22 (×12): 15 mg via INTRAVENOUS
  Filled 2023-04-19 (×12): qty 1

## 2023-04-19 MED ORDER — ACETAMINOPHEN 500 MG PO TABS: 1000.0000 mg | ORAL_TABLET | Freq: Four times a day (QID) | ORAL | Status: DC | PRN

## 2023-04-19 MED ORDER — ENOXAPARIN SODIUM 40 MG/0.4ML IJ SOSY
40.0000 mg | PREFILLED_SYRINGE | INTRAMUSCULAR | Status: DC
  Administered 2023-04-19 – 2023-04-21 (×3): 40 mg via SUBCUTANEOUS
  Filled 2023-04-19 (×3): qty 0.4

## 2023-04-19 MED ORDER — HYDROMORPHONE HCL 1 MG/ML IJ SOLN
INTRAMUSCULAR | Status: AC
  Filled 2023-04-19: qty 1

## 2023-04-19 MED ORDER — SENNOSIDES-DOCUSATE SODIUM 8.6-50 MG PO TABS
1.0000 | ORAL_TABLET | Freq: Two times a day (BID) | ORAL | Status: DC
  Administered 2023-04-20 – 2023-04-22 (×3): 1 via ORAL
  Filled 2023-04-19 (×5): qty 1

## 2023-04-19 MED ORDER — OXYCODONE HCL 5 MG PO TABS
30.0000 mg | ORAL_TABLET | Freq: Once | ORAL | Status: AC
  Administered 2023-04-19: 30 mg via ORAL
  Filled 2023-04-19: qty 6

## 2023-04-19 MED ORDER — ONDANSETRON HCL 4 MG/2ML IJ SOLN
4.0000 mg | Freq: Four times a day (QID) | INTRAMUSCULAR | Status: DC | PRN
  Administered 2023-04-21: 4 mg via INTRAVENOUS
  Filled 2023-04-19: qty 2

## 2023-04-19 MED ORDER — SODIUM CHLORIDE 0.45 % IV SOLN
INTRAVENOUS | Status: AC
  Administered 2023-04-19: 1000 mL via INTRAVENOUS

## 2023-04-19 MED ORDER — HYDROXYUREA 500 MG PO CAPS
1500.0000 mg | ORAL_CAPSULE | Freq: Every day | ORAL | Status: DC
  Administered 2023-04-19 – 2023-04-22 (×4): 1500 mg via ORAL
  Filled 2023-04-19 (×4): qty 3

## 2023-04-19 MED ORDER — SODIUM CHLORIDE 0.9% FLUSH: 9.0000 mL | INTRAVENOUS | Status: DC | PRN

## 2023-04-19 MED ORDER — DIPHENHYDRAMINE HCL 25 MG PO CAPS: 25.0000 mg | ORAL_CAPSULE | ORAL | Status: DC | PRN

## 2023-04-19 MED ORDER — NALOXONE HCL 0.4 MG/ML IJ SOLN: 0.4000 mg | INTRAMUSCULAR | Status: DC | PRN

## 2023-04-19 MED ORDER — KETOROLAC TROMETHAMINE 15 MG/ML IJ SOLN
15.0000 mg | Freq: Once | INTRAMUSCULAR | Status: AC
  Administered 2023-04-19: 15 mg via INTRAVENOUS
  Filled 2023-04-19: qty 1

## 2023-04-19 MED ORDER — METHADONE HCL 10 MG PO TABS
10.0000 mg | ORAL_TABLET | Freq: Three times a day (TID) | ORAL | Status: DC
  Administered 2023-04-19 – 2023-04-22 (×10): 10 mg via ORAL
  Filled 2023-04-19 (×10): qty 1

## 2023-04-19 MED ORDER — HYDROMORPHONE HCL 1 MG/ML IJ SOLN
2.0000 mg | INTRAMUSCULAR | Status: AC | PRN
  Administered 2023-04-19: 1 mg via INTRAVENOUS
  Administered 2023-04-19 (×2): 2 mg via INTRAVENOUS
  Filled 2023-04-19 (×3): qty 2

## 2023-04-19 MED ORDER — LACTATED RINGERS IV BOLUS
1000.0000 mL | Freq: Once | INTRAVENOUS | Status: AC
  Administered 2023-04-19: 1000 mL via INTRAVENOUS

## 2023-04-19 MED ORDER — HYDROMORPHONE HCL 1 MG/ML IJ SOLN
2.0000 mg | Freq: Once | INTRAMUSCULAR | Status: AC
  Administered 2023-04-19: 2 mg via INTRAVENOUS
  Filled 2023-04-19: qty 2

## 2023-04-19 NOTE — ED Provider Notes (Signed)
 [facility] Provider Note  CSN: [id number] Arrival date & time: 04/19/23 0931  Chief Complaint(s) Sickle Cell Pain Crisis  HPI Stephanie Burch is a 43 y.o. female with PMH chronic pain, sickle cell anemia who presents Emergency Department for evaluation of leg pain and sickle cell pain crisis.  Patient states that she has been seen in the pain clinic multiple times this week requiring the Dilaudid PCA.  States that she was informed by a provider at the pain clinic that she should come to the emergency department to be admitted if home medications are not working over the weekend.  States that she is breaking through her home oxycodone and methadone and has severe lower extremity pain.  Denies chest pain, shortness of breath, abdominal pain, nausea, vomiting, headache, fever or other systemic symptoms.   Past Medical History Past Medical History:  Diagnosis Date   Acute kidney injury (HCC) 11/07/2019   Anemia    Chronic pain    COVID-19 virus detected 10/22/2019   Depression    HCAP (healthcare-associated pneumonia) 11/07/2019   History of blood transfusion 2013   x 2   HSV infection    Hypoxia    Pneumonia    x 1   Pulmonary edema 11/11/2019   Sepsis (HCC) 11/07/2019   Sickle cell anemia (HCC) 09/2021   gets them frequently   Tachycardia with heart rate 100-120 beats per minute    Vitamin B12 deficiency 12/2018   Vitamin D deficiency    Patient Active Problem List   Diagnosis Date Noted   Sickle cell disease with crisis (HCC) 03/19/2023   Current moderate episode of major depressive disorder (HCC) 03/04/2022   Sickle cell anemia with pain (HCC) 01/30/2022   Anemia of chronic disease 01/03/2022   Sickle cell crisis (HCC) 12/07/2021   Sickle cell pain crisis (HCC) 11/21/2021   Pulmonary hypertension (HCC) 11/21/2021   Hypoxia    Poor venous access 11/09/2019   History of COVID-19 11/07/2019   Vaginal odor 04/20/2019   Chest pain varying with breathing     Vitamin D deficiency 11/15/2018   Medication management 09/14/2018   Muscle spasms of both lower extremities 08/25/2018   Subjective visual disturbance of right eye 08/25/2018   Hb-SS disease without crisis (HCC) 04/13/2018   Chronic pain syndrome 03/16/2018   Chronic, continuous use of opioids 03/16/2018   Leukocytosis 10/19/2017   Thrombocytopenia (HCC) 10/19/2017   BMI 50.0-59.9, adult (HCC) 10/19/2017   Constipation due to pain medication 08/29/2014   Home Medication(s) Prior to Admission medications   Medication Sig Start Date End Date Taking? Authorizing Provider  acetaminophen (TYLENOL) 500 MG tablet Take 1,000 mg by mouth every 6 (six) hours as needed for mild pain, moderate pain or headache.   Yes [provider]  folic acid (FOLVITE) 1 MG tablet Take 1 mg by mouth daily.   Yes [provider]  hydroxyurea (HYDREA) 500 MG capsule Take 1,500 mg by mouth daily. 02/05/23  Yes [provider]  methadone (DOLOPHINE) 10 MG tablet Take 10 mg by mouth 3 (three) times daily. 03/26/23  Yes [provider]  naloxone [facility]) nasal spray 4 mg/0.1 mL Use as needed for opiod overdose 03/04/22  Yes Paseda, Baird Kay, FNP  ondansetron (ZOFRAN) 4 MG tablet Take by mouth. 02/28/22  Yes [provider]  oxycodone (ROXICODONE) 30 MG immediate release tablet Take 30 mg by mouth every 4 (four) hours.   Yes [provider]  valACYclovir (VALTREX)  1000 MG tablet Take 1 tablet by mouth daily. 01/15/23  Yes [provider]  ibuprofen (ADVIL) 800 MG tablet Take 1 tablet (800 mg total) by mouth every 8 (eight) hours as needed. Patient not taking: Reported on 04/19/2023 12/22/22   Donell Beers, FNP  Vitamin D, Ergocalciferol, (DRISDOL) 1.25 MG (50000 UNIT) CAPS capsule Take 1 capsule (50,000 Units total) by mouth every 7 (seven) days. Patient not taking: Reported on 01/12/2023 03/04/22   Donell Beers, FNP                                                                                                                                     Past Surgical History Past Surgical History:  Procedure Laterality Date   CESAREAN SECTION  2013   x 1   TOOTH EXTRACTION N/A 10/18/2021   Procedure: DENTAL RESTORATION/EXTRACTIONS;  Surgeon: Ocie Doyne, DMD;  Location: [facility] OR;  Service: Oral Surgery;  Laterality: N/A;   Family History Family History  Problem Relation Age of Onset   Hypertension Mother    Sickle cell trait Mother    Diabetes Father    Sickle cell trait Father     Social History Social History   Tobacco Use   Smoking status: Never   Smokeless tobacco: Never  Vaping Use   Vaping status: Never Used  Substance Use Topics   Alcohol use: Not Currently   Drug use: Never   Allergies Patient has no known allergies.  Review of Systems Review of Systems  Musculoskeletal:  Positive for arthralgias.    Physical Exam Vital Signs  I have reviewed the triage vital signs BP 127/82   Pulse 76   Temp 98.2 F (36.8 C) (Oral)   Resp 16   Ht 5\' 5"  (1.651 m)   Wt (!) 157.6 kg   LMP 04/06/2023   SpO2 100%   BMI 57.82 kg/m   Physical Exam Vitals and nursing note reviewed.  Constitutional:      General: She is not in acute distress.    Appearance: She is well-developed.  HENT:     Head: Normocephalic and atraumatic.  Eyes:     Conjunctiva/sclera: Conjunctivae normal.  Cardiovascular:     Rate and Rhythm: Normal rate and regular rhythm.     Heart sounds: No murmur heard. Pulmonary:     Effort: Pulmonary effort is normal. No respiratory distress.     Breath sounds: Normal breath sounds.  Abdominal:     Palpations: Abdomen is soft.     Tenderness: There is no abdominal tenderness.  Musculoskeletal:        General: Tenderness present. No swelling.     Cervical back: Neck supple.  Skin:    General: Skin is warm and dry.     Capillary Refill: Capillary refill takes less than 2 seconds.  Neurological:      Mental Status: She is alert.  Psychiatric:  Mood and Affect: Mood normal.     ED Results and Treatments Labs (all labs ordered are listed, but only abnormal results are displayed) Labs Reviewed  COMPREHENSIVE METABOLIC PANEL - Abnormal; Notable for the following components:      Result Value   Glucose, Bld 107 (*)    All other components within normal limits  CBC WITH DIFFERENTIAL/PLATELET - Abnormal; Notable for the following components:   Hemoglobin 11.8 (*)    HCT 34.4 (*)    MCV 76.4 (*)    RDW 15.6 (*)    All other components within normal limits  RETICULOCYTES - Abnormal; Notable for the following components:   Immature Retic Fract 31.2 (*)    All other components within normal limits  URINALYSIS, ROUTINE W REFLEX MICROSCOPIC - Abnormal; Notable for the following components:   APPearance HAZY (*)    All other components within normal limits  HCG, SERUM, QUALITATIVE  HIV ANTIBODY (ROUTINE TESTING W REFLEX)  CBC                                                                                                                          Radiology No results found.  Pertinent labs & imaging results that were available during my care of the patient were reviewed by me and considered in my medical decision making (see MDM for details).  Medications Ordered in ED Medications  acetaminophen (TYLENOL) tablet 1,000 mg (has no administration in time range)  methadone (DOLOPHINE) tablet 10 mg (10 mg Oral Given 04/19/23 1710)  hydroxyurea (HYDREA) capsule 1,500 mg (1,500 mg Oral Given 04/19/23 1709)  folic acid (FOLVITE) tablet 1 mg (1 mg Oral Given 04/19/23 1710)  senna-docusate (Senokot-S) tablet 1 tablet (has no administration in time range)  polyethylene glycol (MIRALAX / GLYCOLAX) packet 17 g (has no administration in time range)  naloxone (NARCAN) injection 0.4 mg (has no administration in time range)    And  sodium chloride flush (NS) 0.9 % injection 9 mL (has no  administration in time range)  ondansetron (ZOFRAN) injection 4 mg (has no administration in time range)  diphenhydrAMINE (BENADRYL) capsule 25 mg (has no administration in time range)  enoxaparin (LOVENOX) injection 40 mg (has no administration in time range)  0.45 % sodium chloride infusion (1,000 mLs Intravenous New Bag/Given 04/19/23 1710)  ketorolac (TORADOL) 15 MG/ML injection 15 mg (15 mg Intravenous Given 04/19/23 1831)  HYDROmorphone (DILAUDID) 1 mg/mL PCA injection (30 mg Intravenous Set-up / Initial Syringe 04/19/23 1656)  HYDROmorphone (DILAUDID) injection 2 mg (1 mg Intravenous Given 04/19/23 1150)  ketorolac (TORADOL) 15 MG/ML injection 15 mg (15 mg Intravenous Given 04/19/23 1038)  lactated ringers bolus 1,000 mL (1,000 mLs Intravenous New Bag/Given 04/19/23 1043)  HYDROmorphone (DILAUDID) 1 MG/ML injection (  Given 04/19/23 1214)  oxyCODONE (Oxy IR/ROXICODONE) immediate release tablet 30 mg (30 mg Oral Given 04/19/23 1308)  HYDROmorphone (DILAUDID) injection 2 mg (2 mg Intravenous Given 04/19/23 1312)  Procedures Procedures  (including critical care time)  Medical Decision Making / ED Course   This patient presents to the ED for concern of sickle cell pain crisis, this involves an extensive number of treatment options, and is a complaint that carries with it a high risk of complications and morbidity.  The differential diagnosis includes chronic pain, sickle cell pain crisis, avascular necrosis, DVT, acute chest, aplastic crisis  MDM: Patient seen in the emergency room for evaluation of leg pain in the setting of sickle cell pain crisis.  Physical exam with tenderness over bilateral femurs but is otherwise unremarkable.  Laboratory evaluation with a hemoglobin of 11.8 but is otherwise unremarkable.  Patient given Toradol, fluids, 3 rounds of Dilaudid and  patient continues to have pain.  Spoke with sickle cell physician Dr. Hyman Hopes who came and spoke with the patient at bedside and will admit the patient.  Patient admitted.   Additional history obtained:  -External records from outside source obtained and reviewed including: Chart review including previous notes, labs, imaging, consultation notes   Lab Tests: -I ordered, reviewed, and interpreted labs.   The pertinent results include:   Labs Reviewed  COMPREHENSIVE METABOLIC PANEL - Abnormal; Notable for the following components:      Result Value   Glucose, Bld 107 (*)    All other components within normal limits  CBC WITH DIFFERENTIAL/PLATELET - Abnormal; Notable for the following components:   Hemoglobin 11.8 (*)    HCT 34.4 (*)    MCV 76.4 (*)    RDW 15.6 (*)    All other components within normal limits  RETICULOCYTES - Abnormal; Notable for the following components:   Immature Retic Fract 31.2 (*)    All other components within normal limits  URINALYSIS, ROUTINE W REFLEX MICROSCOPIC - Abnormal; Notable for the following components:   APPearance HAZY (*)    All other components within normal limits  HCG, SERUM, QUALITATIVE  HIV ANTIBODY (ROUTINE TESTING W REFLEX)  CBC     Medicines ordered and prescription drug management: Meds ordered this encounter  Medications   HYDROmorphone (DILAUDID) injection 2 mg   ketorolac (TORADOL) 15 MG/ML injection 15 mg   lactated ringers bolus 1,000 mL   HYDROmorphone (DILAUDID) 1 MG/ML injection    Fox, Brie L: cabinet override   oxyCODONE (Oxy IR/ROXICODONE) immediate release tablet 30 mg    Refill:  0   HYDROmorphone (DILAUDID) injection 2 mg   acetaminophen (TYLENOL) tablet 1,000 mg   methadone (DOLOPHINE) tablet 10 mg   hydroxyurea (HYDREA) capsule 1,500 mg   folic acid (FOLVITE) tablet 1 mg   senna-docusate (Senokot-S) tablet 1 tablet   polyethylene glycol (MIRALAX / GLYCOLAX) packet 17 g   AND Linked Order Group    naloxone  (NARCAN) injection 0.4 mg    sodium chloride flush (NS) 0.9 % injection 9 mL   ondansetron (ZOFRAN) injection 4 mg   diphenhydrAMINE (BENADRYL) capsule 25 mg   enoxaparin (LOVENOX) injection 40 mg   0.45 % sodium chloride infusion   ketorolac (TORADOL) 15 MG/ML injection 15 mg   HYDROmorphone (DILAUDID) 1 mg/mL PCA injection    Dose::   HYDROmorphone (DILAUDID) PCA for Sickle Cell    Loading Dose (in mg)::   2    PCA Patient Bolus Dose (in mg)::   0.5    Lockout Interval (in minutes):   10    1 Hour Dose Limit (in mg)::   3    Basal Infusion Rate:  No    -I have reviewed the patients home medicines and have made adjustments as needed  Critical interventions none  Consultations Obtained: I requested consultation with the sickle cell physician,  and discussed lab and imaging findings as well as pertinent plan - they recommend: Hospital admission   Cardiac Monitoring: The patient was maintained on a cardiac monitor.  I personally viewed and interpreted the cardiac monitored which showed an underlying rhythm of: NSR  Social Determinants of Health:  Factors impacting patients care include: none   Reevaluation: After the interventions noted above, I reevaluated the patient and found that they have :improved  Co morbidities that complicate the patient evaluation  Past Medical History:  Diagnosis Date   Acute kidney injury (HCC) 11/07/2019   Anemia    Chronic pain    COVID-19 virus detected 10/22/2019   Depression    HCAP (healthcare-associated pneumonia) 11/07/2019   History of blood transfusion 2013   x 2   HSV infection    Hypoxia    Pneumonia    x 1   Pulmonary edema 11/11/2019   Sepsis (HCC) 11/07/2019   Sickle cell anemia (HCC) 09/2021   gets them frequently   Tachycardia with heart rate 100-120 beats per minute    Vitamin B12 deficiency 12/2018   Vitamin D deficiency       Dispostion: I considered admission for this patient, and patient require hospital  admission for sickle cell pain crisis.     Final Clinical Impression(s) / ED Diagnoses Final diagnoses:  Sickle cell pain crisis [facility])     @PCDICTATION @    Glendora Score, MD 04/19/23 1935

## 2023-04-19 NOTE — Plan of Care (Signed)
  Problem: Education: Goal: Knowledge of General Education information will improve Description: Including pain rating scale, medication(s)/side effects and non-pharmacologic comfort measures Outcome: Progressing   Problem: Health Behavior/Discharge Planning: Goal: Ability to manage health-related needs will improve Outcome: Progressing   Problem: Clinical Measurements: Goal: Ability to maintain clinical measurements within normal limits will improve Outcome: Progressing Goal: Will remain free from infection Outcome: Progressing Goal: Diagnostic test results will improve Outcome: Progressing Goal: Respiratory complications will improve Outcome: Progressing Goal: Cardiovascular complication will be avoided Outcome: Progressing   Problem: Activity: Goal: Risk for activity intolerance will decrease Outcome: Progressing   Problem: Nutrition: Goal: Adequate nutrition will be maintained Outcome: Progressing   Problem: Coping: Goal: Level of anxiety will decrease Outcome: Progressing   Problem: Elimination: Goal: Will not experience complications related to bowel motility Outcome: Progressing Goal: Will not experience complications related to urinary retention Outcome: Progressing   Problem: Pain Managment: Goal: General experience of comfort will improve and/or be controlled Outcome: Progressing   Problem: Safety: Goal: Ability to remain free from injury will improve Outcome: Progressing   Problem: Skin Integrity: Goal: Risk for impaired skin integrity will decrease Outcome: Progressing   Problem: Education: Goal: Knowledge of vaso-occlusive preventative measures will improve Outcome: Progressing Goal: Awareness of infection prevention will improve Outcome: Progressing Goal: Awareness of signs and symptoms of anemia will improve Outcome: Progressing Goal: Long-term complications will improve Outcome: Progressing   Problem: Self-Care: Goal: Ability to  incorporate actions that prevent/reduce pain crisis will improve Outcome: Progressing   Problem: Bowel/Gastric: Goal: Gut motility will be maintained Outcome: Progressing   Problem: Tissue Perfusion: Goal: Complications related to inadequate tissue perfusion will be avoided or minimized Outcome: Progressing   Problem: Respiratory: Goal: Pulmonary complications will be avoided or minimized Outcome: Progressing Goal: Acute Chest Syndrome will be identified early to prevent complications Outcome: Progressing

## 2023-04-19 NOTE — H&P (Signed)
 H&P  Patient Demographics:  Stephanie Burch, is a 43 y.o. female  MRN: [id number]   DOB - 1980/08/20  Admit Date - 04/19/2023  Outpatient Primary MD for the patient is Patient, No Pcp Per  Chief Complaint  Patient presents with   Sickle Cell Pain Crisis     HPI:   Stephanie Burch  is a 43 y.o. female with a medical history significant for sickle cell disease, chronic pain syndrome, morbid obesity, opiate dependence and tolerance, anemia of chronic disease and frequent hospitalizations and ED visits who came to the emergency room today with major complaints of bilateral lower extremity pain that is consistent with her typical sickle cell pain crisis.  Patient was recently seen at the day hospital on 04/14/2023 and 04/16/2023 for the same complaints.  She claims she took her home pain medications with no sustained relief.  Her last medication was this morning.  She said her pain is primarily in her bilateral hip joints and lower extremities.  She denies any joint swelling or redness.  She is rating her pain at 10/10, tearful during this encounter.  She said her pain is constant and throbbing.  She currently has no primary care.  She denies any fever, chills, cough, chest pain, shortness of breath, nausea, vomiting or diarrhea.  No urinary symptoms.  She denies any sick contacts, recent travels or known exposure to COVID-19.  ER course: Her vitals were stable.  She was conscious and alert.  Comprehensive metabolic panel was unremarkable.  Fano cell count was normal, hemoglobin stable at baseline of 11.8.  Normal platelet counts.  Robust reticulocytosis.  Pregnancy test negative.  Patient was given multiple rounds of IV Dilaudid, IV Toradol and oral oxycodone with no improvement in her pain.  Patient will be admitted to the hospital for ongoing evaluation and management of sickle cell pain crisis.   Review of systems:  In addition to the HPI above, patient reports No fever or chills No Headache, No  changes with vision or hearing No problems swallowing food or liquids No chest pain, cough or shortness of breath No abdominal pain, No nausea or vomiting, Bowel movements are regular No blood in stool or urine No dysuria No new skin rashes or bruises No new joints pains-aches No new weakness, tingling, numbness in any extremity No recent weight gain or loss No polyuria, polydypsia or polyphagia No significant Mental Stressors  A full 10 point Review of Systems was done, except as stated above, all other Review of Systems were negative.  With Past History of the following :   Past Medical History:  Diagnosis Date   Acute kidney injury (HCC) 11/07/2019   Anemia    Chronic pain    COVID-19 virus detected 10/22/2019   Depression    HCAP (healthcare-associated pneumonia) 11/07/2019   History of blood transfusion 2013   x 2   HSV infection    Hypoxia    Pneumonia    x 1   Pulmonary edema 11/11/2019   Sepsis (HCC) 11/07/2019   Sickle cell anemia (HCC) 09/2021   gets them frequently   Tachycardia with heart rate 100-120 beats per minute    Vitamin B12 deficiency 12/2018   Vitamin D deficiency       Past Surgical History:  Procedure Laterality Date   CESAREAN SECTION  2013   x 1   TOOTH EXTRACTION N/A 10/18/2021   Procedure: DENTAL RESTORATION/EXTRACTIONS;  Surgeon: Ocie Doyne, DMD;  Location: [facility] OR;  Service: Oral Surgery;  Laterality: N/A;     Social History:   Social History   Tobacco Use   Smoking status: Never   Smokeless tobacco: Never  Substance Use Topics   Alcohol use: Not Currently     Lives - At home   Family History :   Family History  Problem Relation Age of Onset   Hypertension Mother    Sickle cell trait Mother    Diabetes Father    Sickle cell trait Father      Home Medications:   Prior to Admission medications   Medication Sig Start Date End Date Taking? Authorizing Provider  acetaminophen (TYLENOL) 500 MG tablet Take 1,000 mg by mouth  every 6 (six) hours as needed for mild pain, moderate pain or headache.   Yes [provider]  folic acid (FOLVITE) 1 MG tablet Take 1 mg by mouth daily.   Yes [provider]  hydroxyurea (HYDREA) 500 MG capsule Take 1,500 mg by mouth daily. 02/05/23  Yes [provider]  methadone (DOLOPHINE) 10 MG tablet Take 10 mg by mouth 3 (three) times daily. 03/26/23  Yes [provider]  naloxone [facility]) nasal spray 4 mg/0.1 mL Use as needed for opiod overdose 03/04/22  Yes Paseda, Baird Kay, FNP  ondansetron (ZOFRAN) 4 MG tablet Take by mouth. 02/28/22  Yes [provider]  oxycodone (ROXICODONE) 30 MG immediate release tablet Take 30 mg by mouth every 4 (four) hours.   Yes [provider]  valACYclovir (VALTREX) 1000 MG tablet Take 1 tablet by mouth daily. 01/15/23  Yes [provider]  ibuprofen (ADVIL) 800 MG tablet Take 1 tablet (800 mg total) by mouth every 8 (eight) hours as needed. Patient not taking: Reported on 04/19/2023 12/22/22   Donell Beers, FNP  Vitamin D, Ergocalciferol, (DRISDOL) 1.25 MG (50000 UNIT) CAPS capsule Take 1 capsule (50,000 Units total) by mouth every 7 (seven) days. Patient not taking: Reported on 01/12/2023 03/04/22   Donell Beers, FNP     Allergies:   No Known Allergies   Physical Exam:   Vitals:   Vitals:   04/19/23 1300 04/19/23 1338  BP: (!) 123/91   Pulse: 86   Resp: 14   Temp:  98 F (36.7 C)  SpO2: 99%     Physical Exam: Constitutional: Patient appears well-developed and well-nourished. Not in obvious distress.  Morbidly obese. HENT: Normocephalic, atraumatic, External right and left ear normal. Oropharynx is clear and moist.  Eyes: Conjunctivae and EOM are normal. PERRLA, no scleral icterus. Neck: Normal ROM. Neck supple. No JVD. No tracheal deviation. No thyromegaly. CVS: RRR, S1/S2 +, no murmurs, no gallops, no carotid bruit.  Pulmonary: Effort and breath sounds normal, no  stridor, rhonchi, wheezes, rales.  Abdominal: Soft. BS +, no distension, tenderness, rebound or guarding.  Musculoskeletal: Normal range of motion. No edema and no tenderness.  Lymphadenopathy: No lymphadenopathy noted, cervical, inguinal or axillary Neuro: Alert. Normal reflexes, muscle tone coordination. No cranial nerve deficit. Skin: Skin is warm and dry. No rash noted. Not diaphoretic. No erythema. No pallor. Psychiatric: Normal mood and affect. Behavior, judgment, thought content normal.   Data Review:   CBC Recent Labs  Lab 04/14/23 1045 04/16/23 1025 04/19/23 1007  WBC 7.7 6.6 6.9  HGB 12.1 11.9* 11.8*  HCT 36.3 34.6* 34.4*  PLT 264 234 242  MCV 78.6* 77.4* 76.4*  MCH 26.2 26.6 26.2  MCHC 33.3 34.4 34.3  RDW 16.6* 15.9* 15.6*  LYMPHSABS 2.0 2.0 1.8  MONOABS 0.6 0.6 0.6  EOSABS 0.1 0.1 0.1  BASOSABS 0.0 0.0 0.0   ------------------------------------------------------------------------------------------------------------------  Chemistries  Recent Labs  Lab 04/14/23 1045 04/16/23 1025 04/19/23 1007  NA 138 139 138  K 4.2 4.1 3.5  CL 103 106 107  CO2 25 26 24   GLUCOSE 106* 109* 107*  BUN 10 9 7   CREATININE 0.55 0.67 0.70  CALCIUM 9.4 9.2 9.2  AST 23 18 15   ALT 24 22 16   ALKPHOS 74 77 70  BILITOT 0.8 1.0 0.8   ------------------------------------------------------------------------------------------------------------------ estimated creatinine clearance is 143.4 mL/min (by C-G formula based on SCr of 0.7 mg/dL). ------------------------------------------------------------------------------------------------------------------ No results for input(s): "TSH", "T4TOTAL", "T3FREE", "THYROIDAB" in the last 72 hours.  Invalid input(s): "FREET3"  Coagulation profile No results for input(s): "INR", "PROTIME" in the last 168 hours. ------------------------------------------------------------------------------------------------------------------- No results for  input(s): "DDIMER" in the last 72 hours. -------------------------------------------------------------------------------------------------------------------  Cardiac Enzymes No results for input(s): "CKMB", "TROPONINI", "MYOGLOBIN" in the last 168 hours.  Invalid input(s): "CK" ------------------------------------------------------------------------------------------------------------------    Component Value Date/Time   BNP 17.5 11/21/2021 1403    ---------------------------------------------------------------------------------------------------------------  Urinalysis    Component Value Date/Time   COLORURINE YELLOW 02/17/2022 0154   APPEARANCEUR CLEAR 02/17/2022 0154   LABSPEC 1.013 02/17/2022 0154   PHURINE 5.0 02/17/2022 0154   GLUCOSEU NEGATIVE 02/17/2022 0154   HGBUR NEGATIVE 02/17/2022 0154   BILIRUBINUR NEGATIVE 02/17/2022 0154   BILIRUBINUR Negative 04/27/2019 0935   KETONESUR NEGATIVE 02/17/2022 0154   PROTEINUR NEGATIVE 02/17/2022 0154   UROBILINOGEN 1.0 04/27/2019 0935   NITRITE NEGATIVE 02/17/2022 0154   LEUKOCYTESUR NEGATIVE 02/17/2022 0154    ----------------------------------------------------------------------------------------------------------------   Imaging Results:    No results found.   Assessment & Plan:  Principal Problem:   Sickle cell anemia with pain (HCC) Active Problems:   BMI 50.0-59.9, adult (HCC)   Chronic pain syndrome   Anemia of chronic disease  Hb Sickle Cell Disease with crisis: Admit patient, start IVF 0.45% Saline @ 125 mls/hour, start weight based Dilaudid PCA, start IV Toradol 15 mg Q 6 H, Restart oral home pain medications, Monitor vitals very closely, Re-evaluate pain scale regularly, 2 L of Oxygen by [place], Patient will be re-evaluated for pain in the context of function and relationship to baseline as care progresses. Anemia of Chronic Disease: Hemoglobin is stable at baseline today.  There is no clinical indication for blood  transfusion at this time.  Will continue to monitor closely and transfuse as appropriate. Chronic pain Syndrome: Restart and continue oral home pain medications.  Hold oral hydromorphone PCA. Morbid obesity: Patient counseled extensively about nutrition and diet.  DVT Prophylaxis: Subcut Lovenox   AM Labs Ordered, also please review Full Orders  Family Communication: Admission, patient's condition and plan of care including tests being ordered have been discussed with the patient who indicate understanding and agree with the plan and Code Status.  Code Status: Full Code  Consults called: None    Admission status: Inpatient    Time spent in minutes : 50 minutes  Dawit Tankard  04/19/2023 at 2:07 PM

## 2023-04-19 NOTE — ED Notes (Signed)
 Pt refer to wait on lab work incase IV is needs

## 2023-04-19 NOTE — ED Triage Notes (Signed)
 Pt reports with scc x 1 week. Pt has pain in both legs last medication taken at 0400. Oxycodone 30 mg.

## 2023-04-20 LAB — CBC
HCT: 31 % — ABNORMAL LOW (ref 36.0–46.0)
Hemoglobin: 10.8 g/dL — ABNORMAL LOW (ref 12.0–15.0)
MCH: 27.1 pg (ref 26.0–34.0)
MCHC: 34.8 g/dL (ref 30.0–36.0)
MCV: 77.7 fL — ABNORMAL LOW (ref 80.0–100.0)
Platelets: 194 10*3/uL (ref 150–400)
RBC: 3.99 MIL/uL (ref 3.87–5.11)
RDW: 15.1 % (ref 11.5–15.5)
WBC: 8.2 10*3/uL (ref 4.0–10.5)
nRBC: 0.2 % (ref 0.0–0.2)

## 2023-04-20 NOTE — Plan of Care (Signed)

## 2023-04-20 NOTE — Progress Notes (Signed)
 Patient ID: Stephanie Burch, female   DOB: 04-Mar-1980, 43 y.o.   MRN: [id number] Subjective: Rakeisha Burch  is a 43 y.o. female with a medical history significant for sickle cell disease, chronic pain syndrome, morbid obesity, opiate dependence and tolerance, anemia of chronic disease and frequent hospitalizations and ED visits who came to the emergency room today with major complaints of bilateral lower extremity pain that is consistent with her typical sickle cell pain crisis.  Patient was recently seen at the day hospital on 04/14/2023 and 04/16/2023 for the same complaints. She denies any fever, chills, cough, chest pain, shortness of breath, nausea, vomiting or diarrhea.  No urinary symptoms.  She denies any sick contacts, recent travels  Patient is rating pain today at 8/10. Pain is improving however she hasn't met her goal.  Objective:  Vital signs in last 24 hours:  Vitals:   04/20/23 0510 04/20/23 0931 04/20/23 1110 04/20/23 1227  BP:   114/84   Pulse:   80   Resp: 16 18 14 17   Temp:   98.1 F (36.7 C)   TempSrc:   Oral   SpO2: 95% 98% 94%   Weight:      Height:        Intake/Output from previous day:   Intake/Output Summary (Last 24 hours) at 04/20/2023 1244 Last data filed at 04/20/2023 0931 Gross per 24 hour  Intake 2493.07 ml  Output 200 ml  Net 2293.07 ml    Physical Exam: General: Alert, awake, oriented x3, in no acute distress.  HEENT: [place]/AT [place]ER[place], EOMI Neck: Trachea midline,  no masses, no thyromegal,y no JVD, no carotid bruit OROPHARYNX:  Moist, No exudate/ erythema/lesions.  Heart: Regular rate and rhythm, without murmurs, rubs, gallops, PMI non-displaced, no heaves or thrills on palpation.  Lungs: Clear to auscultation, no wheezing or rhonchi noted. No increased vocal fremitus resonant to percussion  Abdomen: Soft, nontender, nondistended, positive bowel sounds, no masses no hepatosplenomegaly noted..  Neuro: No focal neurological deficits noted cranial nerves  II through XII grossly intact. DTRs 2+ bilaterally upper and lower extremities. Strength 5 out of 5 in bilateral upper and lower extremities. Musculoskeletal: No warm swelling or erythema around joints, Bilateral lower extremity pain Psychiatric: Patient alert and oriented x3, good insight and cognition, good recent to remote recall. Lymph node survey: No cervical axillary or inguinal lymphadenopathy noted.  Lab Results:  Basic Metabolic Panel:    Component Value Date/Time   NA 138 04/19/2023 1007   NA 142 09/11/2017 1431   K 3.5 04/19/2023 1007   CL 107 04/19/2023 1007   CO2 24 04/19/2023 1007   BUN 7 04/19/2023 1007   BUN 8 09/11/2017 1431   CREATININE 0.70 04/19/2023 1007   GLUCOSE 107 (H) 04/19/2023 1007   CALCIUM 9.2 04/19/2023 1007   CBC:    Component Value Date/Time   WBC 8.2 04/20/2023 0535   HGB 10.8 (L) 04/20/2023 0535   HGB 13.0 09/11/2017 1431   HCT 31.0 (L) 04/20/2023 0535   HCT 38.4 09/11/2017 1431   PLT 194 04/20/2023 0535   PLT 258 09/11/2017 1431   MCV 77.7 (L) 04/20/2023 0535   MCV 75 (L) 09/11/2017 1431   NEUTROABS 4.4 04/19/2023 1007   NEUTROABS 3.9 09/11/2017 1431   LYMPHSABS 1.8 04/19/2023 1007   LYMPHSABS 2.2 09/11/2017 1431   MONOABS 0.6 04/19/2023 1007   EOSABS 0.1 04/19/2023 1007   EOSABS 0.1 09/11/2017 1431   BASOSABS 0.0 04/19/2023 1007   BASOSABS 0.0 09/11/2017  1431    No results found for this or any previous visit (from the past 240 hours).  Studies/Results: No results found.  Medications: Scheduled Meds:  enoxaparin (LOVENOX) injection  40 mg Subcutaneous Q24H   folic acid  1 mg Oral Daily   HYDROmorphone   Intravenous Q4H   hydroxyurea  1,500 mg Oral Daily   ketorolac  15 mg Intravenous Q6H   methadone  10 mg Oral TID   senna-docusate  1 tablet Oral BID   Continuous Infusions:  sodium chloride 125 mL/hr at 04/20/23 0937   PRN Meds:.acetaminophen, diphenhydrAMINE, naloxone **AND** sodium chloride flush, ondansetron (ZOFRAN)  IV, polyethylene glycol  Consultants: none  Procedures: none  Antibiotics: none  Assessment/Plan: Principal Problem:   Sickle cell anemia with pain (HCC) Active Problems:   BMI 50.0-59.9, adult (HCC)   Chronic pain syndrome   Anemia of chronic disease   Hb Sickle Cell Disease with Pain crisis: Continue IVF 0.45% Saline @ KVO, continue weight based Dilaudid PCA, IV Toradol 15 mg Q 6 H for a total of 5 days, continue oral home pain medications as ordered. Monitor vitals very closely, Re-evaluate pain scale regularly, 2 L of Oxygen by [place]. Patient encouraged to ambulate on the hallway today.   Anemia of Chronic Disease: Hemoglobin is stable at baseline today. There is no clinical indication for blood transfusion at this time. Will continue to monitor closely and transfuse as appropriate.   Chronic pain Syndrome: Restart and continue oral home pain medications.  Morbid obesity: Patient counseled extensively about nutrition and diet.   Code Status: Full Code Family Communication: N/A Disposition Plan: Not yet ready for discharge  Daryll Drown NP  If 7PM-7AM, please contact night-coverage.  04/20/2023, 12:44 PM  LOS: 1 day

## 2023-04-21 LAB — CBC
HCT: 31.7 % — ABNORMAL LOW (ref 36.0–46.0)
Hemoglobin: 10.7 g/dL — ABNORMAL LOW (ref 12.0–15.0)
MCH: 26.5 pg (ref 26.0–34.0)
MCHC: 33.8 g/dL (ref 30.0–36.0)
MCV: 78.5 fL — ABNORMAL LOW (ref 80.0–100.0)
Platelets: 206 10*3/uL (ref 150–400)
RBC: 4.04 MIL/uL (ref 3.87–5.11)
RDW: 15.2 % (ref 11.5–15.5)
WBC: 7.3 10*3/uL (ref 4.0–10.5)
nRBC: 0.3 % — ABNORMAL HIGH (ref 0.0–0.2)

## 2023-04-21 MED ORDER — OXYCODONE HCL 5 MG PO TABS
30.0000 mg | ORAL_TABLET | ORAL | Status: DC | PRN
  Administered 2023-04-21 – 2023-04-22 (×7): 30 mg via ORAL
  Filled 2023-04-21 (×7): qty 6

## 2023-04-21 NOTE — Plan of Care (Signed)

## 2023-04-21 NOTE — Progress Notes (Signed)
 Patient ID: CORI JUSTUS, female   DOB: [birth year], 43 y.o.   MRN: [id number] Subjective: Verne Cove  is a 43 y.o. female with a medical history significant for sickle cell disease, chronic pain syndrome, morbid obesity, opiate dependence and tolerance, anemia of chronic disease and frequent hospitalizations and ED visits who came to the emergency room today with major complaints of bilateral lower extremity pain that is consistent with her typical sickle cell pain crisis.  Patient was recently seen at the day hospital on 04/14/2023 and 04/16/2023 for the same complaints. She denies any fever, chills, cough, chest pain, shortness of breath, nausea, vomiting or diarrhea.  No urinary symptoms.  She denies any sick contacts, recent travels  Patient is rating pain today at 8/10 bilateral lower extremities. No changes from yesterday    Objective:  Vital signs in last 24 hours:  Vitals:   04/21/23 0802 04/21/23 0904 04/21/23 1023 04/21/23 1130  BP:   133/76   Pulse:   89   Resp: 11 11 15 13   Temp:   98.4 F (36.9 C)   TempSrc:   Oral   SpO2: 98% 98% 95% 95%  Weight:      Height:        Intake/Output from previous day:   Intake/Output Summary (Last 24 hours) at 04/21/2023 1344 Last data filed at 04/21/2023 1000 Gross per 24 hour  Intake 1075.97 ml  Output --  Net 1075.97 ml    Physical Exam: General: Alert, awake, oriented x3, in no acute distress.  HEENT: [place]/AT [place], EOMI Neck: Trachea midline,  no masses, no thyromegal,y no JVD, no carotid bruit OROPHARYNX:  Moist, No exudate/ erythema/lesions.  Heart: Regular rate and rhythm, without murmurs, rubs, gallops, PMI non-displaced, no heaves or thrills on palpation.  Lungs: Clear to auscultation, no wheezing or rhonchi noted. No increased vocal fremitus resonant to percussion  Abdomen: Soft, nontender, nondistended, positive bowel sounds, no masses no hepatosplenomegaly noted..  Neuro: No focal neurological deficits noted cranial  nerves II through XII grossly intact. DTRs 2+ bilaterally upper and lower extremities. Strength 5 out of 5 in bilateral upper and lower extremities. Musculoskeletal: No warm swelling or erythema around joints, Lower back pain , bilateral lower extremity pain  Psychiatric: Patient alert and oriented x3, good insight and cognition, good recent to remote recall. Lymph node survey: No cervical axillary or inguinal lymphadenopathy noted.  Lab Results:  Basic Metabolic Panel:    Component Value Date/Time   NA 138 04/19/2023 1007   NA 142 09/11/2017 1431   K 3.5 04/19/2023 1007   CL 107 04/19/2023 1007   CO2 24 04/19/2023 1007   BUN 7 04/19/2023 1007   BUN 8 09/11/2017 1431   CREATININE 0.70 04/19/2023 1007   GLUCOSE 107 (H) 04/19/2023 1007   CALCIUM 9.2 04/19/2023 1007   CBC:    Component Value Date/Time   WBC 7.3 04/21/2023 0632   HGB 10.7 (L) 04/21/2023 0632   HGB 13.0 09/11/2017 1431   HCT 31.7 (L) 04/21/2023 0632   HCT 38.4 09/11/2017 1431   PLT 206 04/21/2023 0632   PLT 258 09/11/2017 1431   MCV 78.5 (L) 04/21/2023 0632   MCV 75 (L) 09/11/2017 1431   NEUTROABS 4.4 04/19/2023 1007   NEUTROABS 3.9 09/11/2017 1431   LYMPHSABS 1.8 04/19/2023 1007   LYMPHSABS 2.2 09/11/2017 1431   MONOABS 0.6 04/19/2023 1007   EOSABS 0.1 04/19/2023 1007   EOSABS 0.1 09/11/2017 1431   BASOSABS 0.0 04/19/2023 1007  BASOSABS 0.0 09/11/2017 1431    No results found for this or any previous visit (from the past 240 hours).  Studies/Results: No results found.  Medications: Scheduled Meds:  enoxaparin (LOVENOX) injection  40 mg Subcutaneous Q24H   folic acid  1 mg Oral Daily   HYDROmorphone   Intravenous Q4H   hydroxyurea  1,500 mg Oral Daily   ketorolac  15 mg Intravenous Q6H   methadone  10 mg Oral TID   senna-docusate  1 tablet Oral BID   Continuous Infusions: PRN Meds:.acetaminophen, diphenhydrAMINE, naloxone **AND** sodium chloride flush, ondansetron (ZOFRAN) IV, polyethylene  glycol  Consultants: none  Procedures: None  Antibiotics: None  Assessment/Plan: Principal Problem:   Sickle cell anemia with pain (HCC) Active Problems:   BMI 50.0-59.9, adult (HCC)   Chronic pain syndrome   Anemia of chronic disease   Hb Sickle Cell Disease with Pain crisis: Continue IVF 0.45% Saline @ KVO, continue weight based Dilaudid PCA, IV Toradol 15 mg Q 6 H for a total of 5 days, continue oral home pain medications as ordered. Monitor vitals very closely, Re-evaluate pain scale regularly, 2 L of Oxygen by [place]. Patient encouraged to ambulate on the hallway today.   Anemia of Chronic Disease: Hemoglobin is stable at baseline today. There is no clinical indication for blood transfusion at this time. Will continue to monitor closely and transfuse as appropriate  Chronic pain Syndrome:Chronic pain Syndrome: Restart and continue oral home pain medications.   Morbid obesity: Patient counseled extensively about nutrition and diet   Code Status: Full Code Family Communication: N/A Disposition Plan: Not yet ready for discharge  Daryll Drown NP  If 7PM-7AM, please contact night-coverage.  04/21/2023, 1:44 PM  LOS: 2 days

## 2023-04-22 LAB — CBC
HCT: 29.8 % — ABNORMAL LOW (ref 36.0–46.0)
Hemoglobin: 10.2 g/dL — ABNORMAL LOW (ref 12.0–15.0)
MCH: 27 pg (ref 26.0–34.0)
MCHC: 34.2 g/dL (ref 30.0–36.0)
MCV: 78.8 fL — ABNORMAL LOW (ref 80.0–100.0)
Platelets: 175 10*3/uL (ref 150–400)
RBC: 3.78 MIL/uL — ABNORMAL LOW (ref 3.87–5.11)
RDW: 14.9 % (ref 11.5–15.5)
WBC: 7.2 10*3/uL (ref 4.0–10.5)
nRBC: 0.6 % — ABNORMAL HIGH (ref 0.0–0.2)

## 2023-04-22 NOTE — Discharge Summary (Signed)
 Physician Discharge Summary  Stephanie Burch WUJ:[id number] DOB: 1980-07-20 DOA: 04/19/2023  PCP: Patient, No Pcp Per  Admit date: 04/19/2023  Discharge date: 04/22/2023  Discharge Diagnoses:  Principal Problem:   Sickle cell anemia with pain (HCC) Active Problems:   BMI 50.0-59.9, adult (HCC)   Chronic pain syndrome   Anemia of chronic disease   Discharge Condition: Stable  Disposition:  Pt is discharged home in good condition and is to follow up with Patient, No Pcp Per this week to have labs evaluated. Stephanie Burch is instructed to increase activity slowly and balance with rest for the next few days, and use prescribed medication to complete treatment of pain  Diet: Regular Wt Readings from Last 3 Encounters:  04/19/23 (!) 157.6 kg  12/16/22 (!) 157.6 kg  03/04/22 (!) 144.9 kg    History of present illness:  Stephanie Burch  is a 43 y.o. female with a medical history significant for sickle cell disease, chronic pain syndrome, morbid obesity, opiate dependence and tolerance, anemia of chronic disease and frequent hospitalizations and ED visits who came to the emergency room with major complaints of bilateral lower extremity pain that is consistent with her typical sickle cell pain crisis.  Patient was recently seen at the day hospital on 04/14/2023 and 04/16/2023 for the same complaints.  She she took her home pain medications with no sustained relief.  Her last medication was this morning.  She said her pain is primarily in her bilateral hip joints and lower extremities.  She denies any joint swelling or redness.  She is rating her pain at 10/10, tearful during this encounter.  She said her pain is constant and throbbing.  She currently has no primary care.  She denies any fever, chills, cough, chest pain, shortness of breath, nausea, vomiting or diarrhea.  No urinary symptoms.  She denies any sick contacts, recent travels or known exposure to COVID-19.   ER course: Her vitals were  stable.  She was conscious and alert.  Comprehensive metabolic panel was unremarkable.  Sypher cell count was normal, hemoglobin stable at baseline of 11.8.  Normal platelet counts.  Robust reticulocytosis.  Pregnancy test negative.  Patient was given multiple rounds of IV Dilaudid, IV Toradol and oral oxycodone with no improvement in her pain.  Patient was admitted to the hospital for ongoing evaluation and management of sickle cell pain crisis.    Hospital Course:  Patient was admitted for sickle cell pain crisis and managed appropriately with IVF, IV Dilaudid via PCA and IV Toradol, as well as other adjunct therapies per sickle cell pain management protocols. Patient reports significant improvement with pain in the last 24 hours.  Patient was therefore discharged home today in a hemodynamically stable condition.   Stephanie Burch will follow-up with PCP within 1 week of this discharge. Stephanie Burch was counseled extensively about nonpharmacologic means of pain management, patient verbalized understanding and was appreciative of  the care received during this admission.  We discussed the need for good hydration, monitoring of hydration status, avoidance of heat, cold, stress, and infection triggers. We discussed the need to be adherent with other home medications. Patient was reminded of the need to seek medical attention immediately if any symptom of bleeding, anemia, or infection occurs.  Discharge Exam: Vitals:   04/22/23 1251 04/22/23 1412  BP: (!) 142/76 117/76  Pulse: 89 81  Resp: 20 17  Temp: 97.6 F (36.4 C) (!) 97.3 F (36.3 C)  SpO2: 99% 91%   Vitals:  04/22/23 1246 04/22/23 1247 04/22/23 1251 04/22/23 1412  BP:   (!) 142/76 117/76  Pulse:   89 81  Resp: 20 20 20 17   Temp:   97.6 F (36.4 C) (!) 97.3 F (36.3 C)  TempSrc:   Axillary Oral  SpO2:   99% 91%  Weight:      Height:        General appearance : Awake, alert, not in any distress. Speech Clear. Not toxic looking HEENT:  Atraumatic and Normocephalic, pupils equally reactive to light and accomodation Neck: Supple, no JVD. No cervical lymphadenopathy.  Chest: Good air entry bilaterally, no added sounds  CVS: S1 S2 regular, no murmurs.  Abdomen: Bowel sounds present, Non tender and not distended with no gaurding, rigidity or rebound. Extremities: B/L Lower Ext shows no edema, both legs are warm to touch Neurology: Awake alert, and oriented X 3, CN II-XII intact, Non focal Skin: No Rash  Discharge Instructions  Discharge Instructions     Diet - low sodium heart healthy   Complete by: As directed    Increase activity slowly   Complete by: As directed         The results of significant diagnostics from this hospitalization (including imaging, microbiology, ancillary and laboratory) are listed below for reference.    Significant Diagnostic Studies: No results found.  Microbiology: No results found for this or any previous visit (from the past 240 hours).   Labs: Basic Metabolic Panel: Recent Labs  Lab 04/16/23 1025 04/19/23 1007  NA 139 138  K 4.1 3.5  CL 106 107  CO2 26 24  GLUCOSE 109* 107*  BUN 9 7  CREATININE 0.67 0.70  CALCIUM 9.2 9.2   Liver Function Tests: Recent Labs  Lab 04/16/23 1025 04/19/23 1007  AST 18 15  ALT 22 16  ALKPHOS 77 70  BILITOT 1.0 0.8  PROT 8.0 7.8  ALBUMIN 3.9 3.7   No results for input(s): "LIPASE", "AMYLASE" in the last 168 hours. No results for input(s): "AMMONIA" in the last 168 hours. CBC: Recent Labs  Lab 04/16/23 1025 04/19/23 1007 04/20/23 0535 04/21/23 0632 04/22/23 0609  WBC 6.6 6.9 8.2 7.3 7.2  NEUTROABS 3.9 4.4  --   --   --   HGB 11.9* 11.8* 10.8* 10.7* 10.2*  HCT 34.6* 34.4* 31.0* 31.7* 29.8*  MCV 77.4* 76.4* 77.7* 78.5* 78.8*  PLT 234 242 194 206 175   Cardiac Enzymes: No results for input(s): "CKTOTAL", "CKMB", "CKMBINDEX", "TROPONINI" in the last 168 hours. BNP: Invalid input(s): "POCBNP" CBG: No results for input(s):  "GLUCAP" in the last 168 hours.  Time coordinating discharge: 50 minutes  Signed:  Daryll Drown NP  [facility] 04/22/2023, 2:36 PM

## 2023-04-22 NOTE — Plan of Care (Signed)

## 2023-04-23 ENCOUNTER — Telehealth: Payer: Self-pay

## 2023-04-23 NOTE — H&P (Signed)
 Erroneous Encounter

## 2023-04-23 NOTE — Transitions of Care (Post Inpatient/ED Visit) (Signed)
   04/23/2023  Name: TONNYA GARBETT MRN: [id number] DOB: [birth year]  Today's TOC FU Call Status: Today's TOC FU Call Status::  (Call Canceled due to PCP noted to be at [facility] on Chart Review prior to call.)  Attempted to reach the patient regarding the most recent Inpatient/ED visit. Today's TOC FU Call Status:: Call Canceled due to PCP noted to be at [facility] on Chart Review prior to call. New PCP listed as Merrie Roof at [facility].   Follow Up Plan: No further outreach attempts will be made at this time. We have been unable to contact the patient.   Gabriel Cirri MSN, RN RN Case [employer] executive [employer]  [facility] Office Hours Wed/Thur  8:00 am-6:00 pm Direct Dial: [phone] Main Phone [phone]  Fax: [phone] [email or web address]

## 2023-05-01 ENCOUNTER — Encounter (HOSPITAL_BASED_OUTPATIENT_CLINIC_OR_DEPARTMENT_OTHER): Payer: Self-pay | Admitting: *Deleted

## 2023-05-01 ENCOUNTER — Other Ambulatory Visit: Payer: Self-pay

## 2023-05-01 ENCOUNTER — Emergency Department (HOSPITAL_BASED_OUTPATIENT_CLINIC_OR_DEPARTMENT_OTHER)
Admission: EM | Admit: 2023-05-01 | Discharge: 2023-05-01 | Disposition: A | Discharge status: Home / Self Care | Attending: Emergency Medicine | Admitting: Emergency Medicine

## 2023-05-01 DIAGNOSIS — D5700 Hb-SS disease with crisis, unspecified: Secondary | ICD-10-CM | POA: Diagnosis present

## 2023-05-01 DIAGNOSIS — Z8616 Personal history of COVID-19: Secondary | ICD-10-CM | POA: Insufficient documentation

## 2023-05-01 LAB — CBC WITH DIFFERENTIAL/PLATELET
Abs Immature Granulocytes: 0.04 10*3/uL (ref 0.00–0.07)
Basophils Absolute: 0 10*3/uL (ref 0.0–0.1)
Basophils Relative: 0 %
Eosinophils Absolute: 0.2 10*3/uL (ref 0.0–0.5)
Eosinophils Relative: 2 %
HCT: 30.6 % — ABNORMAL LOW (ref 36.0–46.0)
Hemoglobin: 10.8 g/dL — ABNORMAL LOW (ref 12.0–15.0)
Immature Granulocytes: 0 %
Lymphocytes Relative: 29 %
Lymphs Abs: 2.8 10*3/uL (ref 0.7–4.0)
MCH: 26.7 pg (ref 26.0–34.0)
MCHC: 35.3 g/dL (ref 30.0–36.0)
MCV: 75.7 fL — ABNORMAL LOW (ref 80.0–100.0)
Monocytes Absolute: 0.8 10*3/uL (ref 0.1–1.0)
Monocytes Relative: 9 %
Neutro Abs: 5.6 10*3/uL (ref 1.7–7.7)
Neutrophils Relative %: 60 %
Platelets: 223 10*3/uL (ref 150–400)
RBC: 4.04 MIL/uL (ref 3.87–5.11)
RDW: 16 % — ABNORMAL HIGH (ref 11.5–15.5)
Smear Review: NORMAL
WBC: 9.4 10*3/uL (ref 4.0–10.5)
nRBC: 2.3 % — ABNORMAL HIGH (ref 0.0–0.2)

## 2023-05-01 LAB — COMPREHENSIVE METABOLIC PANEL WITH GFR
ALT: 12 U/L (ref 0–44)
AST: 13 U/L — ABNORMAL LOW (ref 15–41)
Albumin: 3.9 g/dL (ref 3.5–5.0)
Alkaline Phosphatase: 70 U/L (ref 38–126)
Anion gap: 7 (ref 5–15)
BUN: 8 mg/dL (ref 6–20)
CO2: 30 mmol/L (ref 22–32)
Calcium: 9.3 mg/dL (ref 8.9–10.3)
Chloride: 105 mmol/L (ref 98–111)
Creatinine, Ser: 0.61 mg/dL (ref 0.44–1.00)
GFR, Estimated: >60 mL/min (ref >60)
Glucose, Bld: 92 mg/dL (ref 70–99)
Potassium: 3.9 mmol/L (ref 3.5–5.1)
Sodium: 142 mmol/L (ref 135–145)
Total Bilirubin: 0.9 mg/dL (ref 0.0–1.2)
Total Protein: 6.7 g/dL (ref 6.5–8.1)

## 2023-05-01 LAB — RETICULOCYTES
Immature Retic Fract: 45.1 % — ABNORMAL HIGH (ref 2.3–15.9)
RBC.: 3.95 MIL/uL (ref 3.87–5.11)
Retic Count, Absolute: 243.7 10*3/uL — ABNORMAL HIGH (ref 19.0–186.0)
Retic Ct Pct: 6.2 % — ABNORMAL HIGH (ref 0.4–3.1)

## 2023-05-01 MED ORDER — HYDROMORPHONE HCL 1 MG/ML IJ SOLN
1.0000 mg | Freq: Once | INTRAMUSCULAR | Status: AC
  Administered 2023-05-01: 1 mg via SUBCUTANEOUS
  Filled 2023-05-01: qty 1

## 2023-05-01 MED ORDER — KETOROLAC TROMETHAMINE 15 MG/ML IJ SOLN
15.0000 mg | INTRAMUSCULAR | Status: AC
  Administered 2023-05-01: 15 mg via INTRAVENOUS
  Filled 2023-05-01: qty 1

## 2023-05-01 MED ORDER — HYDROMORPHONE HCL 1 MG/ML IJ SOLN
2.0000 mg | INTRAMUSCULAR | Status: AC
  Administered 2023-05-01: 2 mg via INTRAVENOUS
  Filled 2023-05-01: qty 2

## 2023-05-01 MED ORDER — DEXTROSE-SODIUM CHLORIDE 5-0.45 % IV SOLN: INTRAVENOUS | Status: DC

## 2023-05-01 MED ORDER — HYDROMORPHONE HCL 1 MG/ML IJ SOLN: 2.0000 mg | INTRAMUSCULAR | Status: DC

## 2023-05-01 NOTE — ED Notes (Signed)
 Pt walking with cane from home, helped into wheelchair.

## 2023-05-01 NOTE — ED Notes (Signed)
 Pt unable to void; aware of the request for urine sample.

## 2023-05-01 NOTE — ED Triage Notes (Signed)
 Pt is here for sickle cell pain in her legs which began yesterday and has been unrelieved by pain medication at home.

## 2023-05-01 NOTE — ED Notes (Signed)
 Pt. Reports she is from [facility] and came here due to Sickle Cell pain in her legs.  Pt. Took an Benedetto Goad here for her care.

## 2023-05-01 NOTE — ED Provider Notes (Signed)
 [facility] Provider Note   CSN: [id number] Arrival date & time: 05/01/23  1235     History  Chief Complaint  Patient presents with   Sickle Cell Pain Crisis    Stephanie Burch is a 43 y.o. female with history of sickle cell anemia, CPS, opiate dependence presents with complaints of lower extremity sickle cell pain.  Patient states that this is her typical pain pattern.  No injury or trauma.  Was recently admitted and discharged on 3/19 for similar complaint.  She denies any chest pain, shortness of breath.   Sickle Cell Pain Crisis     Past Medical History:  Diagnosis Date   Acute kidney injury (HCC) 11/07/2019   Anemia    Chronic pain    COVID-19 virus detected 10/22/2019   Depression    HCAP (healthcare-associated pneumonia) 11/07/2019   History of blood transfusion 2013   x 2   HSV infection    Hypoxia    Pneumonia    x 1   Pulmonary edema 11/11/2019   Sepsis (HCC) 11/07/2019   Sickle cell anemia (HCC) 09/2021   gets them frequently   Tachycardia with heart rate 100-120 beats per minute    Vitamin B12 deficiency 12/2018   Vitamin D deficiency      Home Medications Prior to Admission medications   Medication Sig Start Date End Date Taking? Authorizing Provider  acetaminophen (TYLENOL) 500 MG tablet Take 1,000 mg by mouth every 6 (six) hours as needed for mild pain, moderate pain or headache.    [provider]  folic acid (FOLVITE) 1 MG tablet Take 1 mg by mouth daily.    [provider]  hydroxyurea (HYDREA) 500 MG capsule Take 1,500 mg by mouth daily. 02/05/23   [provider]  ibuprofen (ADVIL) 800 MG tablet Take 1 tablet (800 mg total) by mouth every 8 (eight) hours as needed. Patient not taking: Reported on 04/19/2023 12/22/22   Donell Beers, FNP  methadone (DOLOPHINE) 10 MG tablet Take 10 mg by mouth 3 (three) times daily. 03/26/23   [provider]  naloxone [facility]) nasal  spray 4 mg/0.1 mL Use as needed for opiod overdose 03/04/22   Donell Beers, FNP  ondansetron (ZOFRAN) 4 MG tablet Take by mouth. 02/28/22   [provider]  oxycodone (ROXICODONE) 30 MG immediate release tablet Take 30 mg by mouth every 4 (four) hours.    [provider]  valACYclovir (VALTREX) 1000 MG tablet Take 1 tablet by mouth daily. 01/15/23   [provider]  Vitamin D, Ergocalciferol, (DRISDOL) 1.25 MG (50000 UNIT) CAPS capsule Take 1 capsule (50,000 Units total) by mouth every 7 (seven) days. Patient not taking: Reported on 01/12/2023 03/04/22   Donell Beers, FNP      Allergies    Patient has no known allergies.    Review of Systems   Review of Systems  Musculoskeletal:  Positive for myalgias.    Physical Exam Updated Vital Signs BP 132/73   Pulse 83   Temp 98.4 F (36.9 C)   Resp 17   LMP 04/06/2023   SpO2 97%  Physical Exam Vitals and nursing note reviewed.  Constitutional:      General: She is not in acute distress.    Appearance: She is well-developed.  HENT:     Head: Normocephalic and atraumatic.  Eyes:     Conjunctiva/sclera: Conjunctivae normal.  Cardiovascular:     Rate and  Rhythm: Normal rate and regular rhythm.     Heart sounds: No murmur heard. Pulmonary:     Effort: Pulmonary effort is normal. No respiratory distress.     Breath sounds: Normal breath sounds.  Abdominal:     Palpations: Abdomen is soft.     Tenderness: There is no abdominal tenderness.  Musculoskeletal:        General: No swelling.     Cervical back: Neck supple.     Comments: Generalized tenderness to patient's anterior thighs, knees.  No tenderness to calfs.  Tolerates full range of motion of ankles and knees.  There is no overlying erythema, warmth or swelling.  DP pulses symmetric.  NVI  Skin:    General: Skin is warm and dry.     Capillary Refill: Capillary refill takes less than 2 seconds.  Neurological:     Mental Status: She is alert.   Psychiatric:        Mood and Affect: Mood normal.     ED Results / Procedures / Treatments   Labs (all labs ordered are listed, but only abnormal results are displayed) Labs Reviewed  COMPREHENSIVE METABOLIC PANEL WITH GFR - Abnormal; Notable for the following components:      Result Value   AST 13 (*)    All other components within normal limits  CBC WITH DIFFERENTIAL/PLATELET - Abnormal; Notable for the following components:   Hemoglobin 10.8 (*)    HCT 30.6 (*)    MCV 75.7 (*)    RDW 16.0 (*)    nRBC 2.3 (*)    All other components within normal limits  RETICULOCYTES - Abnormal; Notable for the following components:   Retic Ct Pct 6.2 (*)    Retic Count, Absolute 243.7 (*)    Immature Retic Fract 45.1 (*)    All other components within normal limits  PREGNANCY, URINE    EKG None  Radiology No results found.  Procedures Procedures    Medications Ordered in ED Medications  dextrose 5 % and 0.45 % NaCl infusion ( Intravenous New Bag/Given 05/01/23 1545)  HYDROmorphone (DILAUDID) injection 1 mg (1 mg Subcutaneous Given 05/01/23 1307)  ketorolac (TORADOL) 15 MG/ML injection 15 mg (15 mg Intravenous Given 05/01/23 1435)  HYDROmorphone (DILAUDID) injection 2 mg (2 mg Intravenous Given 05/01/23 1546)  HYDROmorphone (DILAUDID) injection 2 mg (2 mg Intravenous Given 05/01/23 1438)    ED Course/ Medical Decision Making/ A&P                                 Medical Decision Making Amount and/or Complexity of Data Reviewed Labs: ordered.  Risk Prescription drug management.   This patient presents to the ED with chief complaint(s) of sickle cell pain crisis.  The complaint involves an extensive differential diagnosis and also carries with it a high risk of complications and morbidity.   pertinent past medical history as listed in HPI  The differential diagnosis includes  Pain crisis, fracture, dislocation, septic joint, gout, DVT  Additional history obtained: Records  reviewed previous admission documents and Care Everywhere/External Records  Initial Assessment:   Hemodynamically stable, nontoxic-appearing patient presenting with sickle cell pain crisis involving her lower extremities.  Pain is typical for her pain crisis.  No injury or trauma.  She takes opiates at home.  Was actually recently discharged on 3/19 for similar complaint.  No chest pain or shortness of breath to suggest acute chest syndrome.  There is generalized tenderness on exam to anterior thighs and knees.  She has no calf tenderness to suggest DVT.  There is no overlying swelling, erythema or warmth and tolerates full range of motion of knees and ankle.  Overall no suspicion for cellulitis or septic joint/gout  Independent ECG interpretation:  none  Independent labs interpretation:  The following labs were independently interpreted:  CMP without significant abnormality, CBC without significant abnormality, reticulocytes with elevated reticulocyte count to 243  Independent visualization and interpretation of imaging: none  Treatment and Reassessment: Patient given Dilaudid 2 mg x 3 plus Toradol Upon reexamination patient reports somewhat improvement of pain.  Discussed with her that I am unable to provide more opiates.  Options at this point would be admission versus discharge home with close follow-up with sickle cell clinic.  Patient would prefer the latter.  Will finish out more fluids and discharge home with close follow-up. Consultations obtained:   none  Disposition:   Patient will be discharged home with follow-up with sickle cell clinic. The patient has been appropriately medically screened and/or stabilized in the ED. I have low suspicion for any other emergent medical condition which would require further screening, evaluation or treatment in the ED or require inpatient management. At time of discharge the patient is hemodynamically stable and in no acute distress. I have  discussed work-up results and diagnosis with patient and answered all questions. Patient is agreeable with discharge plan. We discussed strict return precautions for returning to the emergency department and they verbalized understanding.     Social Determinants of Health:   Patient's lack of prescription access  increases the complexity of managing their presentation  This note was dictated with voice recognition software.  Despite best efforts at proofreading, errors may have occurred which can change the documentation meaning.          Final Clinical Impression(s) / ED Diagnoses Final diagnoses:  Sickle cell pain crisis [facility])    Rx / DC Orders ED Discharge Orders     None         Halford Decamp, PA-C 05/01/23 1731    Edwin Dada P, DO 05/13/23 2359

## 2023-05-01 NOTE — Discharge Instructions (Addendum)
 You were evaluated in the emergency room for sickle cell pain crisis.

## 2023-05-11 ENCOUNTER — Telehealth (HOSPITAL_COMMUNITY): Payer: Self-pay | Admitting: *Deleted

## 2023-05-11 ENCOUNTER — Non-Acute Institutional Stay (HOSPITAL_COMMUNITY)
Admission: AD | Admit: 2023-05-11 | Discharge: 2023-05-11 | Disposition: A | Discharge status: Home / Self Care | Source: Ambulatory Visit | Attending: Internal Medicine | Admitting: Internal Medicine

## 2023-05-11 DIAGNOSIS — D5700 Hb-SS disease with crisis, unspecified: Secondary | ICD-10-CM | POA: Diagnosis present

## 2023-05-11 DIAGNOSIS — Z832 Family history of diseases of the blood and blood-forming organs and certain disorders involving the immune mechanism: Secondary | ICD-10-CM | POA: Diagnosis not present

## 2023-05-11 DIAGNOSIS — M79661 Pain in right lower leg: Secondary | ICD-10-CM | POA: Diagnosis not present

## 2023-05-11 DIAGNOSIS — G894 Chronic pain syndrome: Secondary | ICD-10-CM | POA: Diagnosis not present

## 2023-05-11 DIAGNOSIS — M79662 Pain in left lower leg: Secondary | ICD-10-CM | POA: Insufficient documentation

## 2023-05-11 DIAGNOSIS — F1120 Opioid dependence, uncomplicated: Secondary | ICD-10-CM | POA: Diagnosis not present

## 2023-05-11 DIAGNOSIS — M549 Dorsalgia, unspecified: Secondary | ICD-10-CM | POA: Diagnosis not present

## 2023-05-11 LAB — COMPREHENSIVE METABOLIC PANEL WITH GFR
ALT: 12 U/L (ref 0–44)
AST: 14 U/L — ABNORMAL LOW (ref 15–41)
Albumin: 3.9 g/dL (ref 3.5–5.0)
Alkaline Phosphatase: 70 U/L (ref 38–126)
Anion gap: 9 (ref 5–15)
BUN: 7 mg/dL (ref 6–20)
CO2: 27 mmol/L (ref 22–32)
Calcium: 9.3 mg/dL (ref 8.9–10.3)
Chloride: 105 mmol/L (ref 98–111)
Creatinine, Ser: 0.52 mg/dL (ref 0.44–1.00)
GFR, Estimated: >60 mL/min (ref >60)
Glucose, Bld: 107 mg/dL — ABNORMAL HIGH (ref 70–99)
Potassium: 4.1 mmol/L (ref 3.5–5.1)
Sodium: 141 mmol/L (ref 135–145)
Total Bilirubin: 0.9 mg/dL (ref 0.0–1.2)
Total Protein: 8 g/dL (ref 6.5–8.1)

## 2023-05-11 LAB — RETICULOCYTES
Immature Retic Fract: 27.5 % — ABNORMAL HIGH (ref 2.3–15.9)
RBC.: 4.71 MIL/uL (ref 3.87–5.11)
Retic Count, Absolute: 148.8 10*3/uL (ref 19.0–186.0)
Retic Ct Pct: 3.2 % — ABNORMAL HIGH (ref 0.4–3.1)

## 2023-05-11 LAB — PREGNANCY, URINE: Preg Test, Ur: NEGATIVE

## 2023-05-11 LAB — LACTATE DEHYDROGENASE: LDH: 145 U/L (ref 98–192)

## 2023-05-11 MED ORDER — HYDROMORPHONE 1 MG/ML IV SOLN
INTRAVENOUS | Status: DC
  Administered 2023-05-11: 30 mg via INTRAVENOUS
  Administered 2023-05-11: 14 mg via INTRAVENOUS
  Filled 2023-05-11: qty 30

## 2023-05-11 MED ORDER — KETOROLAC TROMETHAMINE 15 MG/ML IJ SOLN
15.0000 mg | Freq: Four times a day (QID) | INTRAMUSCULAR | Status: DC
  Administered 2023-05-11: 15 mg via INTRAVENOUS
  Filled 2023-05-11 (×2): qty 1

## 2023-05-11 MED ORDER — SODIUM CHLORIDE 0.9% FLUSH: 9.0000 mL | INTRAVENOUS | Status: DC | PRN

## 2023-05-11 MED ORDER — ONDANSETRON HCL 4 MG/2ML IJ SOLN
4.0000 mg | Freq: Four times a day (QID) | INTRAMUSCULAR | Status: DC | PRN
  Administered 2023-05-11: 4 mg via INTRAVENOUS
  Filled 2023-05-11: qty 2

## 2023-05-11 MED ORDER — DIPHENHYDRAMINE HCL 25 MG PO CAPS
25.0000 mg | ORAL_CAPSULE | ORAL | Status: DC | PRN
  Filled 2023-05-11: qty 1

## 2023-05-11 MED ORDER — POLYETHYLENE GLYCOL 3350 17 G PO PACK: 17.0000 g | PACK | Freq: Every day | ORAL | Status: DC | PRN

## 2023-05-11 MED ORDER — SODIUM CHLORIDE 0.45 % IV SOLN: INTRAVENOUS | Status: DC

## 2023-05-11 MED ORDER — NALOXONE HCL 0.4 MG/ML IJ SOLN: 0.4000 mg | INTRAMUSCULAR | Status: DC | PRN

## 2023-05-11 MED ORDER — ACETAMINOPHEN 500 MG PO TABS
1000.0000 mg | ORAL_TABLET | Freq: Once | ORAL | Status: AC
  Administered 2023-05-11: 1000 mg via ORAL
  Filled 2023-05-11: qty 2

## 2023-05-11 MED ORDER — SENNOSIDES-DOCUSATE SODIUM 8.6-50 MG PO TABS
1.0000 | ORAL_TABLET | Freq: Two times a day (BID) | ORAL | Status: DC
  Filled 2023-05-11: qty 1

## 2023-05-11 NOTE — H&P (Signed)
 [facility] History and Physical  Stephanie Burch RUE:[id number] DOB: [birth year] DOA: 05/11/2023  PCP: System, Provider Not In   Chief Complaint:  Chief Complaint  Patient presents with   Sickle Cell Pain Crisis    HPI: Stephanie Burch is a 43 y.o. female with history of sickle cell disease, chronic pain syndrome, opiate dependence and tolerance, obesity, and anemia of chronic disease presents with complaints of generalized pain that is consistent with her typical pain crisis. Patient says that pain intensity has been elevated over the past several days and unrelieved by her home medications. Pain is worse in bilateral extremities and back . Patient last had oxycodone this am without sustained relief. Patient rates pain as 9/10, constant and throbbing. Patient denies fever, chills, chest pain, shortness of breath, nausea, vomiting, or diarrhea. No sick contacts or recent travel. She is unable to identify any inciting factors.    Systemic Review: General: The patient denies anorexia, fever, weight loss Cardiac: Denies chest pain, syncope, palpitations, pedal edema  Respiratory: Denies cough, shortness of breath, wheezing GI: Denies severe indigestion/heartburn, abdominal pain, nausea, vomiting, diarrhea and constipation GU: Denies hematuria, incontinence, dysuria  Musculoskeletal: Back/Bilateral lower extremity pain,  Skin: Denies suspicious skin lesions Neurologic: Denies focal weakness or numbness, change in vision  Past Medical History:  Diagnosis Date   Acute kidney injury (HCC) 11/07/2019   Anemia    Chronic pain    COVID-19 virus detected 10/22/2019   Depression    HCAP (healthcare-associated pneumonia) 11/07/2019   History of blood transfusion 2013   x 2   HSV infection    Hypoxia    Pneumonia    x 1   Pulmonary edema 11/11/2019   Sepsis (HCC) 11/07/2019   Sickle cell anemia (HCC) 09/2021   gets them frequently   Tachycardia with heart rate 100-120 beats per  minute    Vitamin B12 deficiency 12/2018   Vitamin D deficiency     Past Surgical History:  Procedure Laterality Date   CESAREAN SECTION  2013   x 1   TOOTH EXTRACTION N/A 10/18/2021   Procedure: DENTAL RESTORATION/EXTRACTIONS;  Surgeon: Ocie Doyne, DMD;  Location: [facility] OR;  Service: Oral Surgery;  Laterality: N/A;    No Known Allergies  Family History  Problem Relation Age of Onset   Hypertension Mother    Sickle cell trait Mother    Diabetes Father    Sickle cell trait Father       Prior to Admission medications   Medication Sig Start Date End Date Taking? Authorizing Provider  acetaminophen (TYLENOL) 500 MG tablet Take 1,000 mg by mouth every 6 (six) hours as needed for mild pain, moderate pain or headache.    [provider]  folic acid (FOLVITE) 1 MG tablet Take 1 mg by mouth daily.    [provider]  hydroxyurea (HYDREA) 500 MG capsule Take 1,500 mg by mouth daily. 02/05/23   [provider]  ibuprofen (ADVIL) 800 MG tablet Take 1 tablet (800 mg total) by mouth every 8 (eight) hours as needed. Patient not taking: Reported on 04/19/2023 12/22/22   Donell Beers, FNP  methadone (DOLOPHINE) 10 MG tablet Take 10 mg by mouth 3 (three) times daily. 03/26/23   [provider]  naloxone [facility]) nasal spray 4 mg/0.1 mL Use as needed for opiod overdose 03/04/22   Donell Beers, FNP  ondansetron (ZOFRAN) 4 MG tablet Take by mouth. 02/28/22   [provider]  oxycodone (  ROXICODONE) 30 MG immediate release tablet Take 30 mg by mouth every 4 (four) hours.    [provider]  valACYclovir (VALTREX) 1000 MG tablet Take 1 tablet by mouth daily. 01/15/23   [provider]  Vitamin D, Ergocalciferol, (DRISDOL) 1.25 MG (50000 UNIT) CAPS capsule Take 1 capsule (50,000 Units total) by mouth every 7 (seven) days. Patient not taking: Reported on 01/12/2023 03/04/22   Donell Beers, FNP     Physical Exam: Vitals:    05/11/23 1002 05/11/23 1043  BP: (!) 130/91   Pulse: 96   Resp: 16 13  Temp: 98.4 F (36.9 C)   TempSrc: Temporal   SpO2: 98%     General: Alert, awake, afebrile, anicteric, not in obvious distress HEENT: Normocephalic and Atraumatic, Mucous membranes pink                PERRLA; EOM intact; No scleral icterus,                 Nares: Patent, Oropharynx: Clear, Fair Dentition                 Neck: FROM, no cervical lymphadenopathy, thyromegaly, carotid bruit or JVD;  CHEST WALL: No tenderness  CHEST: Normal respiration, clear to auscultation bilaterally  HEART: Regular rate and rhythm; no murmurs rubs or gallops  BACK: No kyphosis or scoliosis; no CVA tenderness  ABDOMEN: Positive Bowel Sounds, soft, non-tender; no masses, no organomegaly EXTREMITIES: No cyanosis, clubbing, or edema SKIN:  no rash or ulceration  CNS: Alert and Oriented x 4, Nonfocal exam, CN 2-12 intact  Labs on Admission:  Basic Metabolic Panel: No results for input(s): "NA", "K", "CL", "CO2", "GLUCOSE", "BUN", "CREATININE", "CALCIUM", "MG", "PHOS" in the last 168 hours. Liver Function Tests: No results for input(s): "AST", "ALT", "ALKPHOS", "BILITOT", "PROT", "ALBUMIN" in the last 168 hours. No results for input(s): "LIPASE", "AMYLASE" in the last 168 hours. No results for input(s): "AMMONIA" in the last 168 hours. CBC: No results for input(s): "WBC", "NEUTROABS", "HGB", "HCT", "MCV", "PLT" in the last 168 hours. Cardiac Enzymes: No results for input(s): "CKTOTAL", "CKMB", "CKMBINDEX", "TROPONINI" in the last 168 hours.  BNP (last 3 results) No results for input(s): "BNP" in the last 8760 hours.  ProBNP (last 3 results) No results for input(s): "PROBNP" in the last 8760 hours.  CBG: No results for input(s): "GLUCAP" in the last 168 hours.   Assessment/Plan Principal Problem:   Sickle cell anemia with pain (HCC)  Admits to the Day Hospital for extended observation IVF 0 .45% Saline @ 125  mls/hour Weight based Dilaudid PCA started within 30 minutes of admission IV Toradol 15 mg 1x dose Acetaminophen 1000 mg x 1 dose Labs: CBCD, CMP, Retic Count and LDH Monitor vitals very closely, Re-evaluate pain scale every hour 2 L of Oxygen by [place] Patient will be re-evaluated for pain in the context of function and relationship to baseline as care progresses. If no significant relieve from pain (remains above 5/10) will transfer patient to inpatient services for further evaluation and management  Code Status: Full  Family Communication: None  DVT Prophylaxis: Ambulate as tolerated   Time spent: 35 Minutes  Daryll Drown NP  If 7PM-7AM, please contact night-coverage [email or web address] 05/11/2023, 11:08 AM

## 2023-05-11 NOTE — Telephone Encounter (Signed)
 Patient called requesting to come to the day hospital for pain management. Patient reports bilateral arm and leg pain rated 8/10. Reports taking Oxycodone and Methadone at 4:00 am. COVID-19 screening done and patient denies all symptoms and exposures. Denies fever, chest pain, nausea, vomiting, diarrhea and abdominal pain. Admits to having transportation without driving self. Nita Sells, NP notified. Patient can come to the day hospital for pain management. Patient advised and expresses an understanding.

## 2023-05-11 NOTE — Discharge Summary (Signed)
 Physician Discharge Summary  ORETTA BERKLAND WGN:[id number] DOB: Jun 20, 1980 DOA: 05/11/2023  PCP: System, Provider Not In  Admit date: 05/11/2023  Discharge date: 05/11/2023  Time spent: 30 minutes  Discharge Diagnoses:  Principal Problem:   Sickle cell anemia with pain [facility])   Discharge Condition: Stable  Diet recommendation: Regular  History of present illness:  Stephanie Burch is a 43 y.o. female with history of sickle cell disease, chronic pain syndrome, opiate dependence and tolerance, obesity, and anemia of chronic disease presents with complaints of generalized pain that is consistent with her typical pain crisis. Patient says that pain intensity has been elevated over the past several days and unrelieved by her home medications. Pain is worse in bilateral extremities and back . Patient last had oxycodone this am without sustained relief. Patient rates pain as 9/10, constant and throbbing. Patient denies fever, chills, chest pain, shortness of breath, nausea, vomiting, or diarrhea. No sick contacts or recent travel. She is unable to identify any inciting factors.    Day Hospital Course:  [name] was admitted to the day hospital with sickle cell painful crisis. Patient was treated with IV fluid, weight based IV Dilaudid PCA, IV Toradol, clinician assisted doses as deemed appropriate, and other adjunct therapies per sickle cell pain management protocol. Rex showed significant improvement symptomatically, pain improved from 10/10 to 8/10 at the time of discharge. Patient was discharged home in a hemodynamically stable condition. Zurii will follow-up at the clinic as previously scheduled, continue with home medications as per prior to admission.  Discharge Instructions We discussed the need for good hydration, monitoring of hydration status, avoidance of heat, cold, stress, and infection triggers. We discussed the need to be compliant with taking other home medications. [name]  was reminded of the need to seek medical attention immediately if any symptom of bleeding, anemia, or infection occurs.  Discharge Exam: Vitals:   05/11/23 1550 05/11/23 1604  BP: 114/74   Pulse: 89   Resp: 11 12  Temp:    SpO2: 92%     General appearance: alert, cooperative and no distress Eyes: conjunctivae/corneas clear. PERRL, EOM's intact. Fundi benign. Neck: no adenopathy, no carotid bruit, no JVD, supple, symmetrical, trachea midline and thyroid not enlarged, symmetric, no tenderness/mass/nodules Back: symmetric, no curvature. ROM normal. No CVA tenderness. Resp: clear to auscultation bilaterally Chest wall: no tenderness Cardio: regular rate and rhythm, S1, S2 normal, no murmur, click, rub or gallop GI: soft, non-tender; bowel sounds normal; no masses,  no organomegaly Extremities: extremities normal, atraumatic, no cyanosis or edema Pulses: 2+ and symmetric Skin: Skin color, texture, turgor normal. No rashes or lesions Neurologic: Grossly normal  Discharge Instructions     Diet - low sodium heart healthy   Complete by: As directed    Increase activity slowly   Complete by: As directed       Allergies as of 05/11/2023   No Known Allergies      Medication List     TAKE these medications    acetaminophen 500 MG tablet Commonly known as: TYLENOL Take 1,000 mg by mouth every 6 (six) hours as needed for mild pain, moderate pain or headache.   folic acid 1 MG tablet Commonly known as: FOLVITE Take 1 mg by mouth daily.   hydroxyurea 500 MG capsule Commonly known as: HYDREA Take 1,500 mg by mouth daily.   ibuprofen 800 MG tablet Commonly known as: ADVIL Take 1 tablet (800 mg total) by mouth every 8 (eight) hours  as needed.   methadone 10 MG tablet Commonly known as: DOLOPHINE Take 10 mg by mouth 3 (three) times daily.   naloxone 4 MG/0.1ML Liqd nasal spray kit Commonly known as: NARCAN Use as needed for opiod overdose   ondansetron 4 MG  tablet Commonly known as: ZOFRAN Take by mouth.   oxycodone 30 MG immediate release tablet Commonly known as: ROXICODONE Take 30 mg by mouth every 4 (four) hours.   valACYclovir 1000 MG tablet Commonly known as: VALTREX Take 1 tablet by mouth daily.   Vitamin D (Ergocalciferol) 1.25 MG (50000 UNIT) Caps capsule Commonly known as: DRISDOL Take 1 capsule (50,000 Units total) by mouth every 7 (seven) days.       No Known Allergies   Significant Diagnostic Studies: No results found.  Signed:  Daryll Drown NP   05/11/2023, 4:55 PM

## 2023-05-11 NOTE — Progress Notes (Signed)
 Patient admitted to the day hospital for sickle cell pain crisis. Initially, patient reported bilateral arm and leg pain rated 9/10. For pain management, patient placed on Sickle Cell Dose Dilaudid PCA, given IV Toradol, PO Tylenol and hydrated with IV fluids. At discharge, patient rated pain at 8/10. Vital signs stable. AVS offered but patient refused. Patient alert, oriented and ambulatory at discharge.

## 2023-05-13 ENCOUNTER — Emergency Department (HOSPITAL_COMMUNITY)
Admission: EM | Admit: 2023-05-13 | Discharge: 2023-05-13 | Disposition: A | Discharge status: Home / Self Care | Attending: Emergency Medicine | Admitting: Emergency Medicine

## 2023-05-13 DIAGNOSIS — D5700 Hb-SS disease with crisis, unspecified: Secondary | ICD-10-CM | POA: Diagnosis not present

## 2023-05-13 DIAGNOSIS — M79602 Pain in left arm: Secondary | ICD-10-CM | POA: Diagnosis present

## 2023-05-13 LAB — CBC WITH DIFFERENTIAL/PLATELET
Abs Immature Granulocytes: 0.02 10*3/uL (ref 0.00–0.07)
Basophils Absolute: 0 10*3/uL (ref 0.0–0.1)
Basophils Relative: 0 %
Eosinophils Absolute: 0.1 10*3/uL (ref 0.0–0.5)
Eosinophils Relative: 1 %
HCT: 36.4 % (ref 36.0–46.0)
Hemoglobin: 12.4 g/dL (ref 12.0–15.0)
Immature Granulocytes: 0 %
Lymphocytes Relative: 25 %
Lymphs Abs: 1.6 10*3/uL (ref 0.7–4.0)
MCH: 25.9 pg — ABNORMAL LOW (ref 26.0–34.0)
MCHC: 34.1 g/dL (ref 30.0–36.0)
MCV: 76 fL — ABNORMAL LOW (ref 80.0–100.0)
Monocytes Absolute: 0.5 10*3/uL (ref 0.1–1.0)
Monocytes Relative: 8 %
Neutro Abs: 4.3 10*3/uL (ref 1.7–7.7)
Neutrophils Relative %: 66 %
Platelets: 274 10*3/uL (ref 150–400)
RBC: 4.79 MIL/uL (ref 3.87–5.11)
RDW: 14.5 % (ref 11.5–15.5)
WBC: 6.6 10*3/uL (ref 4.0–10.5)
nRBC: 0 % (ref 0.0–0.2)

## 2023-05-13 LAB — COMPREHENSIVE METABOLIC PANEL WITH GFR
ALT: 14 U/L (ref 0–44)
AST: 14 U/L — ABNORMAL LOW (ref 15–41)
Albumin: 3.8 g/dL (ref 3.5–5.0)
Alkaline Phosphatase: 70 U/L (ref 38–126)
Anion gap: 7 (ref 5–15)
BUN: 7 mg/dL (ref 6–20)
CO2: 27 mmol/L (ref 22–32)
Calcium: 9.2 mg/dL (ref 8.9–10.3)
Chloride: 103 mmol/L (ref 98–111)
Creatinine, Ser: 0.59 mg/dL (ref 0.44–1.00)
GFR, Estimated: >60 mL/min (ref >60)
Glucose, Bld: 108 mg/dL — ABNORMAL HIGH (ref 70–99)
Potassium: 3.8 mmol/L (ref 3.5–5.1)
Sodium: 137 mmol/L (ref 135–145)
Total Bilirubin: 0.7 mg/dL (ref 0.0–1.2)
Total Protein: 7.8 g/dL (ref 6.5–8.1)

## 2023-05-13 LAB — RETICULOCYTES
Immature Retic Fract: 19.4 % — ABNORMAL HIGH (ref 2.3–15.9)
RBC.: 4.79 MIL/uL (ref 3.87–5.11)
Retic Count, Absolute: 147.1 10*3/uL (ref 19.0–186.0)
Retic Ct Pct: 3.1 % (ref 0.4–3.1)

## 2023-05-13 LAB — HCG, SERUM, QUALITATIVE: Preg, Serum: NEGATIVE

## 2023-05-13 MED ORDER — HYDROMORPHONE HCL 1 MG/ML IJ SOLN
2.0000 mg | INTRAMUSCULAR | Status: AC
  Administered 2023-05-13: 2 mg via INTRAVENOUS
  Filled 2023-05-13: qty 2

## 2023-05-13 MED ORDER — HYDROMORPHONE HCL 1 MG/ML IJ SOLN: 2.0000 mg | INTRAMUSCULAR | Status: DC

## 2023-05-13 MED ORDER — HYDROMORPHONE HCL 1 MG/ML IJ SOLN
2.0000 mg | INTRAMUSCULAR | Status: AC
  Administered 2023-05-13: 2 mg via INTRAVENOUS

## 2023-05-13 MED ORDER — MORPHINE SULFATE (PF) 4 MG/ML IV SOLN
8.0000 mg | INTRAVENOUS | Status: DC
  Filled 2023-05-13: qty 2

## 2023-05-13 MED ORDER — ONDANSETRON HCL 4 MG/2ML IJ SOLN
4.0000 mg | INTRAMUSCULAR | Status: DC | PRN
  Administered 2023-05-13: 4 mg via INTRAVENOUS
  Filled 2023-05-13: qty 2

## 2023-05-13 MED ORDER — KETOROLAC TROMETHAMINE 15 MG/ML IJ SOLN
15.0000 mg | INTRAMUSCULAR | Status: AC
  Administered 2023-05-13: 15 mg via INTRAVENOUS
  Filled 2023-05-13: qty 1

## 2023-05-13 MED ORDER — SODIUM CHLORIDE 0.9 % IV SOLN
12.5000 mg | Freq: Once | INTRAVENOUS | Status: AC
  Administered 2023-05-13: 12.5 mg via INTRAVENOUS
  Filled 2023-05-13: qty 12.5

## 2023-05-13 MED ORDER — OXYCODONE HCL 5 MG PO TABS: 15.0000 mg | ORAL_TABLET | Freq: Once | ORAL | Status: DC

## 2023-05-13 MED ORDER — SODIUM CHLORIDE 0.45 % IV SOLN: INTRAVENOUS | Status: DC

## 2023-05-13 MED ORDER — MORPHINE SULFATE (PF) 10 MG/ML IV SOLN: 10.0000 mg | INTRAVENOUS | Status: DC

## 2023-05-13 MED ORDER — HYDROMORPHONE HCL 1 MG/ML IJ SOLN
2.0000 mg | INTRAMUSCULAR | Status: DC
  Filled 2023-05-13: qty 2

## 2023-05-13 NOTE — Discharge Instructions (Addendum)
 Please follow-up with your sickle cell clinic, I spoke with the sickle cell coordinator here, and you do meet you do not meet the criteria for admission.  Please return if you feel like your symptoms are worsening.  Take your pain medication at home.

## 2023-05-13 NOTE — ED Triage Notes (Signed)
 Pt arrived reporting scc for the last few days. States she has pain in her lower extremities. Has been taking her medication, no improvement. Denies fever, chest pain or any other symptoms

## 2023-05-13 NOTE — ED Provider Notes (Signed)
 [place] EMERGENCY DEPARTMENT AT [facility] Provider Note   CSN: [id number] Arrival date & time: 05/13/23  4098     History  Chief Complaint  Patient presents with   Sickle Cell Pain Crisis    Stephanie Burch is a 43 y.o. female, history of sickle cell anemia, chronic pain disorder, who presents to the ED secondary to bilateral arm pain, and bilateral leg pain, this been going on for the last day or so.  She states the pain is uncontrollable, she has been taking her methadone, 10 mg, as well as oxycodone 30 mg q4H, without any relief.  She states she goes to the [facility] cell clinic, at [facility], but does not like going to the hospital there, because they are "mean."  She denies any chest pain, shortness of breath, fevers, chills.  No recent injuries, surgeries, use of birth control.  Home Medications Prior to Admission medications   Medication Sig Start Date End Date Taking? Authorizing Provider  acetaminophen (TYLENOL) 500 MG tablet Take 1,000 mg by mouth every 6 (six) hours as needed for mild pain, moderate pain or headache.    [provider]  folic acid (FOLVITE) 1 MG tablet Take 1 mg by mouth daily.    [provider]  hydroxyurea (HYDREA) 500 MG capsule Take 1,500 mg by mouth daily. 02/05/23   [provider]  ibuprofen (ADVIL) 800 MG tablet Take 1 tablet (800 mg total) by mouth every 8 (eight) hours as needed. 12/22/22   Donell Beers, FNP  methadone (DOLOPHINE) 10 MG tablet Take 10 mg by mouth 3 (three) times daily. 03/26/23   [provider]  naloxone [facility]) nasal spray 4 mg/0.1 mL Use as needed for opiod overdose 03/04/22   Donell Beers, FNP  ondansetron (ZOFRAN) 4 MG tablet Take by mouth. 02/28/22   [provider]  oxycodone (ROXICODONE) 30 MG immediate release tablet Take 30 mg by mouth every 4 (four) hours.    [provider]  valACYclovir (VALTREX) 1000 MG tablet Take 1 tablet by mouth daily. 01/15/23    [provider]  Vitamin D, Ergocalciferol, (DRISDOL) 1.25 MG (50000 UNIT) CAPS capsule Take 1 capsule (50,000 Units total) by mouth every 7 (seven) days. Patient not taking: Reported on 01/12/2023 03/04/22   Donell Beers, FNP      Allergies    Patient has no known allergies.    Review of Systems   Review of Systems  Respiratory:  Negative for shortness of breath.   Cardiovascular:  Negative for chest pain.    Physical Exam Updated Vital Signs BP 128/84   Pulse 76   Temp 98.3 F (36.8 C)   Resp 18   LMP 05/07/2023   SpO2 98%  Physical Exam Vitals and nursing note reviewed.  Constitutional:      General: She is not in acute distress.    Appearance: She is well-developed.  HENT:     Head: Normocephalic and atraumatic.  Eyes:     Conjunctiva/sclera: Conjunctivae normal.  Cardiovascular:     Rate and Rhythm: Normal rate and regular rhythm.     Heart sounds: No murmur heard. Pulmonary:     Effort: Pulmonary effort is normal. No respiratory distress.     Breath sounds: Normal breath sounds.  Abdominal:     Palpations: Abdomen is soft.     Tenderness: There is no abdominal tenderness.  Musculoskeletal:        General: No swelling.  Cervical back: Neck supple.     Comments: No edema, generalized tenderness to palpation of the area of bilateral arms, and legs.  Positive dorsalis pedis pulses and radial pulses.  No particular bony pain, on exam, no rash, erythema, warmth.  Skin:    General: Skin is warm and dry.     Capillary Refill: Capillary refill takes less than 2 seconds.  Neurological:     Mental Status: She is alert.  Psychiatric:        Mood and Affect: Mood normal.     ED Results / Procedures / Treatments   Labs (all labs ordered are listed, but only abnormal results are displayed) Labs Reviewed  CBC WITH DIFFERENTIAL/PLATELET - Abnormal; Notable for the following components:      Result Value   MCV 76.0 (*)    MCH 25.9 (*)    All other  components within normal limits  COMPREHENSIVE METABOLIC PANEL WITH GFR - Abnormal; Notable for the following components:   Glucose, Bld 108 (*)    AST 14 (*)    All other components within normal limits  RETICULOCYTES - Abnormal; Notable for the following components:   Immature Retic Fract 19.4 (*)    All other components within normal limits  HCG, SERUM, QUALITATIVE    EKG None  Radiology No results found.  Procedures Procedures    Medications Ordered in ED Medications  0.45 % sodium chloride infusion (0 mLs Intravenous Stopped 05/13/23 1300)  ondansetron (ZOFRAN) injection 4 mg (4 mg Intravenous Given 05/13/23 0941)  ketorolac (TORADOL) 15 MG/ML injection 15 mg (15 mg Intravenous Given 05/13/23 0941)  diphenhydrAMINE (BENADRYL) 12.5 mg in sodium chloride 0.9 % 50 mL IVPB (0 mg Intravenous Stopped 05/13/23 1012)  HYDROmorphone (DILAUDID) injection 2 mg (2 mg Intravenous Given 05/13/23 0941)  HYDROmorphone (DILAUDID) injection 2 mg (2 mg Intravenous Given 05/13/23 1020)  HYDROmorphone (DILAUDID) injection 2 mg (2 mg Intravenous Given 05/13/23 1141)    ED Course/ Medical Decision Making/ A&P                                 Medical Decision Making Patient is a 43 year old female, history of sickle cell anemia, here for arm and leg pain, it has been going on for the last day or so.  Was recently seen on 4/7, for similar pain.  States is typical of her sickle cell, denies any fevers, chills, chest pain, urinary symptoms.  Is overall well-appearing, does not appear to be in any acute distress.  I have a sickle cell plan in place, for pain control, as well as we will obtain hemoglobin, reticulocytes for further evaluation to see if in flare  Amount and/or Complexity of Data Reviewed Labs: ordered.    Details: Labs unremarkable at baseline Discussion of management or test interpretation with external provider(s): Discussed with nurse practitioner for sickle cell clinic, [name], she states that  patient has no acute signs, of his sickle cell pain crisis, and that this likely represents her chronic pain.  Was seen on 4/7, and admitted for short time, for pain management.  She exclusively sees [facility], for her sickle cell care, but states she goes here, because "you guys actually give me meds."  I discussed this with the nurse practitioner, she states that this is likely a chronic pain issue, she should follow-up with chronic pain doctor outpatient.  As well as the sickle cell clinic that she  is established with outpatient.  No need for acute hospitalization for pain management.  Additionally her legs, are unremarkable, just tender throughout, but no particular calf tenderness, or any kind of loss of sensation, or color of the feet.  Good pulses.  Soft compartments.  She became very verbally aggressive, with provider, insisting that she speak to the sickle cell coordinator, I recommend that she call the clinic instead, she became very angry by this.  She eventually did speak with the nurse practitioner on the phone by calling, and was discharged home  Risk Prescription drug management.    Final Clinical Impression(s) / ED Diagnoses Final diagnoses:  Sickle cell pain crisis [facility])    Rx / DC Orders ED Discharge Orders     None         Marilouise Densmore, Harley [place], PA 05/13/23 1405    Margarita Grizzle, MD 05/14/23 1143

## 2023-06-01 ENCOUNTER — Non-Acute Institutional Stay (HOSPITAL_COMMUNITY)
Admission: AD | Admit: 2023-06-01 | Discharge: 2023-06-01 | Disposition: A | Discharge status: Home / Self Care | Source: Ambulatory Visit | Attending: Internal Medicine | Admitting: Internal Medicine

## 2023-06-01 ENCOUNTER — Telehealth (HOSPITAL_COMMUNITY): Payer: Self-pay | Admitting: *Deleted

## 2023-06-01 DIAGNOSIS — G894 Chronic pain syndrome: Secondary | ICD-10-CM | POA: Diagnosis not present

## 2023-06-01 DIAGNOSIS — Z79624 Long term (current) use of inhibitors of nucleotide synthesis: Secondary | ICD-10-CM | POA: Diagnosis not present

## 2023-06-01 DIAGNOSIS — Z79899 Other long term (current) drug therapy: Secondary | ICD-10-CM | POA: Insufficient documentation

## 2023-06-01 DIAGNOSIS — D638 Anemia in other chronic diseases classified elsewhere: Secondary | ICD-10-CM | POA: Diagnosis not present

## 2023-06-01 DIAGNOSIS — F1120 Opioid dependence, uncomplicated: Secondary | ICD-10-CM | POA: Insufficient documentation

## 2023-06-01 DIAGNOSIS — E669 Obesity, unspecified: Secondary | ICD-10-CM | POA: Diagnosis not present

## 2023-06-01 DIAGNOSIS — D5700 Hb-SS disease with crisis, unspecified: Secondary | ICD-10-CM | POA: Diagnosis present

## 2023-06-01 LAB — RETICULOCYTES
Immature Retic Fract: 45.7 % — ABNORMAL HIGH (ref 2.3–15.9)
RBC.: 3.9 MIL/uL (ref 3.87–5.11)
Retic Count, Absolute: 317.1 10*3/uL — ABNORMAL HIGH (ref 19.0–186.0)
Retic Ct Pct: 8.1 % — ABNORMAL HIGH (ref 0.4–3.1)

## 2023-06-01 LAB — CBC WITH DIFFERENTIAL/PLATELET
Abs Immature Granulocytes: 0.05 10*3/uL (ref 0.00–0.07)
Basophils Absolute: 0 10*3/uL (ref 0.0–0.1)
Basophils Relative: 0 %
Eosinophils Absolute: 0.4 10*3/uL (ref 0.0–0.5)
Eosinophils Relative: 4 %
HCT: 29.7 % — ABNORMAL LOW (ref 36.0–46.0)
Hemoglobin: 9.9 g/dL — ABNORMAL LOW (ref 12.0–15.0)
Immature Granulocytes: 1 %
Lymphocytes Relative: 20 %
Lymphs Abs: 2 10*3/uL (ref 0.7–4.0)
MCH: 25.6 pg — ABNORMAL LOW (ref 26.0–34.0)
MCHC: 33.3 g/dL (ref 30.0–36.0)
MCV: 76.7 fL — ABNORMAL LOW (ref 80.0–100.0)
Monocytes Absolute: 0.8 10*3/uL (ref 0.1–1.0)
Monocytes Relative: 8 %
Neutro Abs: 6.9 10*3/uL (ref 1.7–7.7)
Neutrophils Relative %: 67 %
Platelets: 216 10*3/uL (ref 150–400)
RBC: 3.87 MIL/uL (ref 3.87–5.11)
RDW: 16.8 % — ABNORMAL HIGH (ref 11.5–15.5)
WBC: 10.1 10*3/uL (ref 4.0–10.5)
nRBC: 10.1 % — ABNORMAL HIGH (ref 0.0–0.2)

## 2023-06-01 LAB — COMPREHENSIVE METABOLIC PANEL WITH GFR
ALT: 19 U/L (ref 0–44)
AST: 19 U/L (ref 15–41)
Albumin: 3.5 g/dL (ref 3.5–5.0)
Alkaline Phosphatase: 67 U/L (ref 38–126)
Anion gap: 9 (ref 5–15)
BUN: 7 mg/dL (ref 6–20)
CO2: 26 mmol/L (ref 22–32)
Calcium: 8.8 mg/dL — ABNORMAL LOW (ref 8.9–10.3)
Chloride: 108 mmol/L (ref 98–111)
Creatinine, Ser: 0.58 mg/dL (ref 0.44–1.00)
GFR, Estimated: >60 mL/min (ref >60)
Glucose, Bld: 105 mg/dL — ABNORMAL HIGH (ref 70–99)
Potassium: 3.8 mmol/L (ref 3.5–5.1)
Sodium: 143 mmol/L (ref 135–145)
Total Bilirubin: 2.1 mg/dL — ABNORMAL HIGH (ref 0.0–1.2)
Total Protein: 7.2 g/dL (ref 6.5–8.1)

## 2023-06-01 LAB — LACTATE DEHYDROGENASE: LDH: 208 U/L — ABNORMAL HIGH (ref 98–192)

## 2023-06-01 MED ORDER — ACETAMINOPHEN 500 MG PO TABS
1000.0000 mg | ORAL_TABLET | Freq: Once | ORAL | Status: AC
  Administered 2023-06-01: 1000 mg via ORAL
  Filled 2023-06-01: qty 2

## 2023-06-01 MED ORDER — ONDANSETRON HCL 4 MG/2ML IJ SOLN
4.0000 mg | Freq: Four times a day (QID) | INTRAMUSCULAR | Status: DC | PRN
  Administered 2023-06-01: 4 mg via INTRAVENOUS
  Filled 2023-06-01: qty 2

## 2023-06-01 MED ORDER — KETOROLAC TROMETHAMINE 15 MG/ML IJ SOLN
15.0000 mg | Freq: Four times a day (QID) | INTRAMUSCULAR | Status: DC
  Administered 2023-06-01: 15 mg via INTRAVENOUS
  Filled 2023-06-01: qty 1

## 2023-06-01 MED ORDER — DIPHENHYDRAMINE HCL 25 MG PO CAPS: 25.0000 mg | ORAL_CAPSULE | ORAL | Status: DC | PRN

## 2023-06-01 MED ORDER — HYDROMORPHONE 1 MG/ML IV SOLN
INTRAVENOUS | Status: DC
  Administered 2023-06-01: 30 mg via INTRAVENOUS
  Administered 2023-06-01: 13.5 mg via INTRAVENOUS
  Filled 2023-06-01: qty 30

## 2023-06-01 MED ORDER — SODIUM CHLORIDE 0.9% FLUSH: 9.0000 mL | INTRAVENOUS | Status: DC | PRN

## 2023-06-01 MED ORDER — SENNOSIDES-DOCUSATE SODIUM 8.6-50 MG PO TABS
1.0000 | ORAL_TABLET | Freq: Two times a day (BID) | ORAL | Status: DC
  Filled 2023-06-01: qty 1

## 2023-06-01 MED ORDER — SODIUM CHLORIDE 0.45 % IV SOLN: INTRAVENOUS | Status: DC

## 2023-06-01 MED ORDER — NALOXONE HCL 0.4 MG/ML IJ SOLN: 0.4000 mg | INTRAMUSCULAR | Status: DC | PRN

## 2023-06-01 MED ORDER — POLYETHYLENE GLYCOL 3350 17 G PO PACK: 17.0000 g | PACK | Freq: Every day | ORAL | Status: DC | PRN

## 2023-06-01 NOTE — Progress Notes (Signed)
 Patient admitted to the day hospital for sickle cell pain crisis. Initially, patient reported back and bilateral leg pain rated 8/10. For pain management, patient placed on Sickle Cell Dose Dilaudid  PCA, given IV Toradol , PO Tylenol  and patient hydrated with IV fluids. At discharge, patient rated pain at 6/10. Vital signs stable. Printed AVS offered but patient refused. Patient alert, oriented and ambulatory at discharge.

## 2023-06-01 NOTE — Telephone Encounter (Signed)
 Patient called requesting to come to the day hospital for pain management. Patient reports back and bilateral leg pain rated 8/10. Reports taking Oxycodone  and Methadone  at 5:30 am. COVID-19 screening done and patient denies all symptoms and exposures. Denies fever, chest pain, nausea, vomiting, diarrhea and abdominal pain. Admits to having transportation without driving self. [name], NP notified and advised that patient come to the day hospital for pain management. Patient advised and expresses an understanding.

## 2023-06-01 NOTE — H&P (Signed)
 [facility] History and Physical  Stephanie Burch WJX:[id number] DOB: 03/01/1980 DOA: 06/01/2023  PCP: Patient, No Pcp Per   Chief Complaint:  Chief Complaint  Patient presents with   Sickle Cell Pain Crisis    HPI: Stephanie Burch is a 43 y.o. female with history of sickle cell disease, chronic pain syndrome, opiate dependence and tolerance, obesity, and anemia of chronic disease presents with complaints of generalized pain, that is consistent with her typical pain crisis. Patient says that pain intensity has been elevated over the past few days and unrelieved by her home medications. Pain is worse in bilateral extremities and back. Pain precipitated from driving daughter back and forth to [place]  for spring break. Patient last had oxycodone  this am without sustained relief. Patient rates pain as 9/10, constant and throbbing. Patient denies fever, chills, chest pain, shortness of breath, nausea, vomiting, or diarrhea. No sick contacts or recent travel. She is unable to identify any inciting factors.    Systemic Review: General: The patient denies anorexia, fever, weight loss Cardiac: Denies chest pain, syncope, palpitations, pedal edema  Respiratory: Denies cough, shortness of breath, wheezing GI: Denies severe indigestion/heartburn, abdominal pain, nausea, vomiting, diarrhea and constipation GU: Denies hematuria, incontinence, dysuria  Musculoskeletal:  Lower back pain, bilateral lower extremity pain Skin: Denies suspicious skin lesions Neurologic: Denies focal weakness or numbness, change in vision  Past Medical History:  Diagnosis Date   Acute kidney injury (HCC) 11/07/2019   Anemia    Chronic pain    COVID-19 virus detected 10/22/2019   Depression    HCAP (healthcare-associated pneumonia) 11/07/2019   History of blood transfusion 2013   x 2   HSV infection    Hypoxia    Pneumonia    x 1   Pulmonary edema 11/11/2019   Sepsis (HCC) 11/07/2019   Sickle cell  anemia (HCC) 09/2021   gets them frequently   Tachycardia with heart rate 100-120 beats per minute    Vitamin B12 deficiency 12/2018   Vitamin D  deficiency     Past Surgical History:  Procedure Laterality Date   CESAREAN SECTION  2013   x 1   TOOTH EXTRACTION N/A 10/18/2021   Procedure: DENTAL RESTORATION/EXTRACTIONS;  Surgeon: Ascencion Lava, DMD;  Location: [facility] OR;  Service: Oral Surgery;  Laterality: N/A;    No Known Allergies  Family History  Problem Relation Age of Onset   Hypertension Mother    Sickle cell trait Mother    Diabetes Father    Sickle cell trait Father       Prior to Admission medications   Medication Sig Start Date End Date Taking? Authorizing Provider  acetaminophen  (TYLENOL ) 500 MG tablet Take 1,000 mg by mouth every 6 (six) hours as needed for mild pain, moderate pain or headache.   Yes [provider]  folic acid  (FOLVITE ) 1 MG tablet Take 1 mg by mouth daily.   Yes [provider]  ibuprofen  (ADVIL ) 800 MG tablet Take 1 tablet (800 mg total) by mouth every 8 (eight) hours as needed. 12/22/22  Yes Paseda, Folashade R, FNP  methadone  (DOLOPHINE ) 10 MG tablet Take 10 mg by mouth 3 (three) times daily. 03/26/23  Yes [provider]  ondansetron  (ZOFRAN ) 4 MG tablet Take by mouth. 02/28/22  Yes [provider]  oxycodone  (ROXICODONE ) 30 MG immediate release tablet Take 30 mg by mouth every 4 (four) hours.   Yes [provider]  valACYclovir  (VALTREX ) 1000 MG tablet Take 1 tablet  by mouth daily. 01/15/23  Yes [provider]  hydroxyurea  (HYDREA ) 500 MG capsule Take 1,500 mg by mouth daily. 02/05/23   [provider]  naloxone  (NARCAN ) nasal spray 4 mg/0.1 mL Use as needed for opiod overdose 03/04/22   Paseda, Folashade R, FNP  Vitamin D , Ergocalciferol , (DRISDOL ) 1.25 MG (50000 UNIT) CAPS capsule Take 1 capsule (50,000 Units total) by mouth every 7 (seven) days. Patient not taking: Reported on 01/12/2023  03/04/22   Paseda, Folashade R, FNP     Physical Exam: Vitals:   06/01/23 1033 06/01/23 1044 06/01/23 1243  BP: (!) 129/99  126/72  Pulse: 94  80  Resp: 18 18 16   Temp: 97.7 F (36.5 C)  97.7 F (36.5 C)  TempSrc: Temporal  Temporal  SpO2: 97%  99%    General: Alert, awake, afebrile, anicteric, not in obvious distress HEENT: Normocephalic and Atraumatic, Mucous membranes pink                PERRLA; EOM intact; No scleral icterus,                 Nares: Patent, Oropharynx: Clear, Fair Dentition                 Neck: FROM, no cervical lymphadenopathy, thyromegaly, carotid bruit or JVD;  CHEST WALL: No tenderness  CHEST: Normal respiration, clear to auscultation bilaterally  HEART: Regular rate and rhythm; no murmurs rubs or gallops  BACK: No kyphosis or scoliosis; no CVA tenderness  ABDOMEN: Positive Bowel Sounds, soft, non-tender; no masses, no organomegaly EXTREMITIES: No cyanosis, clubbing, or edema SKIN:  no rash or ulceration  CNS: Alert and Oriented x 4, Nonfocal exam, CN 2-12 intact  Labs on Admission:  Basic Metabolic Panel: Recent Labs  Lab 06/01/23 1030  NA 143  K 3.8  CL 108  CO2 26  GLUCOSE 105*  BUN 7  CREATININE 0.58  CALCIUM 8.8*   Liver Function Tests: Recent Labs  Lab 06/01/23 1030  AST 19  ALT 19  ALKPHOS 67  BILITOT 2.1*  PROT 7.2  ALBUMIN 3.5   No results for input(s): "LIPASE", "AMYLASE" in the last 168 hours. No results for input(s): "AMMONIA" in the last 168 hours. CBC: Recent Labs  Lab 06/01/23 1030  WBC 10.1  NEUTROABS 6.9  HGB 9.9*  HCT 29.7*  MCV 76.7*  PLT 216   Cardiac Enzymes: No results for input(s): "CKTOTAL", "CKMB", "CKMBINDEX", "TROPONINI" in the last 168 hours.  BNP (last 3 results) No results for input(s): "BNP" in the last 8760 hours.  ProBNP (last 3 results) No results for input(s): "PROBNP" in the last 8760 hours.  CBG: No results for input(s): "GLUCAP" in the last 168  hours.   Assessment/Plan Principal Problem:   Sickle-cell disease with pain (HCC)  Admits to the Day Hospital for extended observation IVF 0 .45% Saline @ 100 mls/hour Weight based Dilaudid  PCA started within 30 minutes of admission IV Toradol  15 mg Q X 1 doses Acetaminophen  1000 mg x 1 dose Labs: CBCD, CMP, Retic Count and LDH Monitor vitals very closely, Re-evaluate pain scale every hour 2 L of Oxygen  by [place] Patient will be re-evaluated for pain in the context of function and relationship to baseline as care progresses. If no significant relieve from pain (remains above 5/10) will transfer patient to inpatient services for further evaluation and management  Code Status: Full  Family Communication: None  DVT Prophylaxis: Ambulate as tolerated   Time spent: 35 Minutes  Lorel Roes NP   If 7PM-7AM, please contact night-coverage [email or web address] 06/01/2023, 1:55 PM

## 2023-06-01 NOTE — Discharge Summary (Signed)
 Physician Discharge Summary  Stephanie Burch VWU:[id number] DOB: 08-15-1980 DOA: 06/01/2023  PCP: Patient, No Pcp Per  Admit date: 06/01/2023  Discharge date: 06/01/2023  Time spent: 30 minutes  Discharge Diagnoses:  Principal Problem:   Sickle-cell disease with pain [facility])   Discharge Condition: Stable  Diet recommendation: Regular  History of present illness:    Hospital Course:  Stephanie Burch was admitted to the day hospital with sickle cell painful crisis. Patient was treated with IV fluid, weight based IV Dilaudid  PCA, IV Toradol , clinician assisted doses as deemed appropriate, and other adjunct therapies per sickle cell pain management protocol. Stephanie Burch showed significant improvement symptomatically, pain improved from 9/10 to 6/10 at the time of discharge. Patient was discharged home in a hemodynamically stable condition. Stephanie Burch will follow-up at the clinic as previously scheduled, continue with home medications as per prior to admission.  Discharge Instructions We discussed the need for good hydration, monitoring of hydration status, avoidance of heat, cold, stress, and infection triggers. We discussed the need to be compliant with taking Hydrea  and other home medications. Stephanie Burch was reminded of the need to seek medical attention immediately if any symptom of bleeding, anemia, or infection occurs.  Discharge Exam: Vitals:   06/01/23 1243 06/01/23 1444  BP: 126/72 112/69  Pulse: 80 86  Resp: 16 15  Temp: 97.7 F (36.5 C)   SpO2: 99% 96%    General appearance: alert, cooperative and no distress Eyes: conjunctivae/corneas clear. PERRL, EOM's intact. Fundi benign. Neck: no adenopathy, no carotid bruit, no JVD, supple, symmetrical, trachea midline and thyroid  not enlarged, symmetric, no tenderness/mass/nodules Back: symmetric, no curvature. ROM normal. No CVA tenderness. Resp: clear to auscultation bilaterally Chest wall: no tenderness Cardio: regular rate and rhythm,  S1, S2 normal, no murmur, click, rub or gallop GI: soft, non-tender; bowel sounds normal; no masses,  no organomegaly Extremities: extremities normal, atraumatic, no cyanosis or edema Pulses: 2+ and symmetric Skin: Skin color, texture, turgor normal. No rashes or lesions Neurologic: Grossly normal  Discharge Instructions     Diet - low sodium heart healthy   Complete by: As directed    Increase activity slowly   Complete by: As directed       Allergies as of 06/01/2023   No Known Allergies      Medication List     TAKE these medications    acetaminophen  500 MG tablet Commonly known as: TYLENOL  Take 1,000 mg by mouth every 6 (six) hours as needed for mild pain, moderate pain or headache.   folic acid  1 MG tablet Commonly known as: FOLVITE  Take 1 mg by mouth daily.   hydroxyurea  500 MG capsule Commonly known as: HYDREA  Take 1,500 mg by mouth daily.   ibuprofen  800 MG tablet Commonly known as: ADVIL  Take 1 tablet (800 mg total) by mouth every 8 (eight) hours as needed.   methadone  10 MG tablet Commonly known as: DOLOPHINE  Take 10 mg by mouth 3 (three) times daily.   naloxone  4 MG/0.1ML Liqd nasal spray kit Commonly known as: NARCAN  Use as needed for opiod overdose   ondansetron  4 MG tablet Commonly known as: ZOFRAN  Take by mouth.   oxycodone  30 MG immediate release tablet Commonly known as: ROXICODONE  Take 30 mg by mouth every 4 (four) hours.   valACYclovir  1000 MG tablet Commonly known as: VALTREX  Take 1 tablet by mouth daily.   Vitamin D  (Ergocalciferol ) 1.25 MG (50000 UNIT) Caps capsule Commonly known as: DRISDOL  Take 1 capsule (50,000 Units total)  by mouth every 7 (seven) days.       No Known Allergies   Significant Diagnostic Studies: No results found.  Signed:  Lorel Roes Np  06/01/2023, 3:48 PM

## 2023-06-04 ENCOUNTER — Telehealth (HOSPITAL_COMMUNITY): Payer: Self-pay | Admitting: *Deleted

## 2023-06-04 NOTE — Telephone Encounter (Signed)
 Patient called requesting to come to the day hospital for sickle cell pain. Patient reports bilateral leg pain rated 8/10. Reports taking Oxycodone  and Methadone  at 5:00 am. Patient was treated in the day hospital for sickle cell pain on Monday. Denies fever, chest pain, nausea, vomiting, diarrhea and abdominal pain. Darvin England, NP notified. Since patient has been to the day hospital this week, provider advised that patient try and manage pain at home with home medications. Patient advised and expresses an understanding.

## 2023-06-10 ENCOUNTER — Telehealth (HOSPITAL_COMMUNITY): Payer: Self-pay

## 2023-06-10 ENCOUNTER — Non-Acute Institutional Stay (HOSPITAL_COMMUNITY)
Admission: AD | Admit: 2023-06-10 | Discharge: 2023-06-10 | Disposition: A | Discharge status: Home / Self Care | Source: Ambulatory Visit | Attending: Internal Medicine | Admitting: Internal Medicine

## 2023-06-10 DIAGNOSIS — F1120 Opioid dependence, uncomplicated: Secondary | ICD-10-CM | POA: Diagnosis not present

## 2023-06-10 DIAGNOSIS — D5700 Hb-SS disease with crisis, unspecified: Secondary | ICD-10-CM | POA: Diagnosis present

## 2023-06-10 DIAGNOSIS — G894 Chronic pain syndrome: Secondary | ICD-10-CM | POA: Insufficient documentation

## 2023-06-10 DIAGNOSIS — M79609 Pain in unspecified limb: Secondary | ICD-10-CM | POA: Insufficient documentation

## 2023-06-10 DIAGNOSIS — M549 Dorsalgia, unspecified: Secondary | ICD-10-CM | POA: Insufficient documentation

## 2023-06-10 DIAGNOSIS — Z832 Family history of diseases of the blood and blood-forming organs and certain disorders involving the immune mechanism: Secondary | ICD-10-CM | POA: Diagnosis not present

## 2023-06-10 LAB — CBC WITH DIFFERENTIAL/PLATELET
Abs Immature Granulocytes: 0.02 10*3/uL (ref 0.00–0.07)
Basophils Absolute: 0 10*3/uL (ref 0.0–0.1)
Basophils Relative: 0 %
Eosinophils Absolute: 0.1 10*3/uL (ref 0.0–0.5)
Eosinophils Relative: 1 %
HCT: 33.8 % — ABNORMAL LOW (ref 36.0–46.0)
Hemoglobin: 11.3 g/dL — ABNORMAL LOW (ref 12.0–15.0)
Immature Granulocytes: 0 %
Lymphocytes Relative: 22 %
Lymphs Abs: 1.6 10*3/uL (ref 0.7–4.0)
MCH: 25.6 pg — ABNORMAL LOW (ref 26.0–34.0)
MCHC: 33.4 g/dL (ref 30.0–36.0)
MCV: 76.5 fL — ABNORMAL LOW (ref 80.0–100.0)
Monocytes Absolute: 0.6 10*3/uL (ref 0.1–1.0)
Monocytes Relative: 9 %
Neutro Abs: 4.8 10*3/uL (ref 1.7–7.7)
Neutrophils Relative %: 68 %
Platelets: 289 10*3/uL (ref 150–400)
RBC: 4.42 MIL/uL (ref 3.87–5.11)
RDW: 14.6 % (ref 11.5–15.5)
WBC: 7.1 10*3/uL (ref 4.0–10.5)
nRBC: 0 % (ref 0.0–0.2)

## 2023-06-10 LAB — RETICULOCYTES
Immature Retic Fract: 23.9 % — ABNORMAL HIGH (ref 2.3–15.9)
RBC.: 4.4 MIL/uL (ref 3.87–5.11)
Retic Count, Absolute: 161.5 10*3/uL (ref 19.0–186.0)
Retic Ct Pct: 3.7 % — ABNORMAL HIGH (ref 0.4–3.1)

## 2023-06-10 LAB — LACTATE DEHYDROGENASE: LDH: 155 U/L (ref 98–192)

## 2023-06-10 MED ORDER — ONDANSETRON HCL 4 MG/2ML IJ SOLN
4.0000 mg | Freq: Four times a day (QID) | INTRAMUSCULAR | Status: DC | PRN
  Administered 2023-06-10: 4 mg via INTRAVENOUS
  Filled 2023-06-10: qty 2

## 2023-06-10 MED ORDER — POLYETHYLENE GLYCOL 3350 17 G PO PACK: 17.0000 g | PACK | Freq: Every day | ORAL | Status: DC | PRN

## 2023-06-10 MED ORDER — DIPHENHYDRAMINE HCL 25 MG PO CAPS
25.0000 mg | ORAL_CAPSULE | ORAL | Status: DC | PRN
  Filled 2023-06-10: qty 1

## 2023-06-10 MED ORDER — HYDROMORPHONE 1 MG/ML IV SOLN
INTRAVENOUS | Status: DC
  Administered 2023-06-10: 14 mg via INTRAVENOUS
  Administered 2023-06-10: 30 mg via INTRAVENOUS
  Filled 2023-06-10: qty 30

## 2023-06-10 MED ORDER — SENNOSIDES-DOCUSATE SODIUM 8.6-50 MG PO TABS
1.0000 | ORAL_TABLET | Freq: Two times a day (BID) | ORAL | Status: DC
  Filled 2023-06-10: qty 1

## 2023-06-10 MED ORDER — SODIUM CHLORIDE 0.9% FLUSH: 9.0000 mL | INTRAVENOUS | Status: DC | PRN

## 2023-06-10 MED ORDER — SODIUM CHLORIDE 0.45 % IV SOLN: INTRAVENOUS | Status: DC

## 2023-06-10 MED ORDER — ACETAMINOPHEN 500 MG PO TABS
1000.0000 mg | ORAL_TABLET | Freq: Once | ORAL | Status: AC
  Administered 2023-06-10: 1000 mg via ORAL
  Filled 2023-06-10: qty 2

## 2023-06-10 MED ORDER — KETOROLAC TROMETHAMINE 15 MG/ML IJ SOLN: 15.0000 mg | Freq: Four times a day (QID) | INTRAMUSCULAR | Status: DC

## 2023-06-10 MED ORDER — NALOXONE HCL 0.4 MG/ML IJ SOLN: 0.4000 mg | INTRAMUSCULAR | Status: DC | PRN

## 2023-06-10 NOTE — Discharge Summary (Signed)
 Physician Discharge Summary  Stephanie Burch ZOX:[id number] DOB: 1981-01-21 DOA: 06/10/2023  PCP: Patient, No Pcp Per  Admit date: 06/10/2023  Discharge date: 06/10/2023  Time spent: 30 minutes  Discharge Diagnoses:  Principal Problem:   Sickle cell anemia with pain [facility])   Discharge Condition: Stable  Diet recommendation: Regular  History of present illness:  Stephanie Burch is a 43 y.o. female with history of sickle cell disease, chronic pain syndrome, opiate dependence and tolerance, obesity, and anemia of chronic disease presents with complaints of generalized pain, that is consistent with her typical pain crisis. Patient says that pain intensity has been elevated over the past few days and unrelieved by her home medications. Pain is worse in bilateral extremities and back. Pain precipitated from driving long distance with her friends. Patient last had oxycodone  this am without sustained relief. Patient rates pain as 9/10, constant and throbbing. Patient denies fever, chills, chest pain, shortness of breath, nausea, vomiting, or diarrhea. No sick contacts or recent travel.   Hospital Course:  Stephanie Burch was admitted to the day hospital with sickle cell painful crisis. Patient was treated with IV fluid, weight based IV Dilaudid  PCA, IV Toradol , clinician assisted doses as deemed appropriate, and other adjunct therapies per sickle cell pain management protocol. Stephanie Burch showed significant improvement symptomatically, pain improved from 10/10 to 6/10 at the time of discharge. Patient was discharged home in a hemodynamically stable condition. Stephanie Burch will follow-up at the clinic as previously scheduled, continue with home medications as per prior to admission.  Discharge Instructions We discussed the need for good hydration, monitoring of hydration status, avoidance of heat, cold, stress, and infection triggers. We discussed the need to be compliant with taking other home medications.  Stephanie Burch was reminded of the need to seek medical attention immediately if any symptom of bleeding, anemia, or infection occurs.  Discharge Exam: Vitals:   06/10/23 1205 06/10/23 1421  BP: 121/63 120/64  Pulse: 95 92  Resp: 20 14  Temp:  98 F (36.7 C)  SpO2: 96% 94%    General appearance: alert, cooperative and no distress Eyes: conjunctivae/corneas clear. PERRL, EOM's intact. Fundi benign. Neck: no adenopathy, no carotid bruit, no JVD, supple, symmetrical, trachea midline and thyroid  not enlarged, symmetric, no tenderness/mass/nodules Back: symmetric, no curvature. ROM normal. No CVA tenderness. Resp: clear to auscultation bilaterally Chest wall: no tenderness Cardio: regular rate and rhythm, S1, S2 normal, no murmur, click, rub or gallop GI: soft, non-tender; bowel sounds normal; no masses,  no organomegaly Extremities: extremities normal, atraumatic, no cyanosis or edema Pulses: 2+ and symmetric Skin: Skin color, texture, turgor normal. No rashes or lesions Neurologic: Grossly normal  Discharge Instructions     Diet - low sodium heart healthy   Complete by: As directed    Increase activity slowly   Complete by: As directed       Allergies as of 06/10/2023   No Known Allergies      Medication List     TAKE these medications    acetaminophen  500 MG tablet Commonly known as: TYLENOL  Take 1,000 mg by mouth every 6 (six) hours as needed for mild pain, moderate pain or headache.   folic acid  1 MG tablet Commonly known as: FOLVITE  Take 1 mg by mouth daily.   hydroxyurea  500 MG capsule Commonly known as: HYDREA  Take 1,500 mg by mouth daily.   ibuprofen  800 MG tablet Commonly known as: ADVIL  Take 1 tablet (800 mg total) by mouth every 8 (eight)  hours as needed.   methadone  10 MG tablet Commonly known as: DOLOPHINE  Take 10 mg by mouth 3 (three) times daily.   naloxone  4 MG/0.1ML Liqd nasal spray kit Commonly known as: NARCAN  Use as needed for opiod overdose    ondansetron  4 MG tablet Commonly known as: ZOFRAN  Take by mouth.   oxycodone  30 MG immediate release tablet Commonly known as: ROXICODONE  Take 30 mg by mouth every 4 (four) hours.   valACYclovir  1000 MG tablet Commonly known as: VALTREX  Take 1 tablet by mouth daily.   Vitamin D  (Ergocalciferol ) 1.25 MG (50000 UNIT) Caps capsule Commonly known as: DRISDOL  Take 1 capsule (50,000 Units total) by mouth every 7 (seven) days.       No Known Allergies   Significant Diagnostic Studies: No results found.  Signed:  Lorel Roes NP  06/10/2023, 3:38 PM

## 2023-06-10 NOTE — Progress Notes (Signed)
 Pt admitted to the day hospital by Marylu Soda FNP for treatment of sickle cell pain crisis. Pt reported pain to bilateral legs and back rated a 8/10. Pt given PO Tylenol , IV zofran ,placed on Dilaudid  PCA 0.5/10/3, and hydrated with 1/2 NS IV fluids. At discharge patient reported pain a 6/10. Pt declined AVS.  Pt alert , oriented and ambulatory at discharge.

## 2023-06-10 NOTE — Telephone Encounter (Signed)
 Pt called the day hospital wanting to come in today for sickle cell pain treatment. Pt reports 8/10 pain to legs and back. Pt reports Oxycodone  30 mg and Methadone  10mg  at 5 AM. Pt denies being to the ER recently. Pt screened for COVID and denies symptoms and exposures. Pt denies fever, chest pain, N/V/D, abdominal pain. Marylu Soda, NP notified and per provider pt can come to the day hospital today. Pt notified and verbalized understanding. Per pt her friend will be her transportation today.

## 2023-06-10 NOTE — H&P (Signed)
 [facility] History and Physical  AREAL KOHTZ ZOX:[id number] DOB: 01/16/1981 DOA: 06/10/2023  PCP: Patient, No Pcp Per   Chief Complaint:  Chief Complaint  Patient presents with   Sickle Cell Pain Crisis    HPI: Stephanie Burch is a 43 y.o. female with history of sickle cell disease, chronic pain syndrome, opiate dependence and tolerance, obesity, and anemia of chronic disease presents with complaints of generalized pain, that is consistent with her typical pain crisis. Patient says that pain intensity has been elevated over the past few days and unrelieved by her home medications. Pain is worse in bilateral extremities and back. Pain precipitated from driving long distance with her friends. Patient last had oxycodone  this am without sustained relief. Patient rates pain as 9/10, constant and throbbing. Patient denies fever, chills, chest pain, shortness of breath, nausea, vomiting, or diarrhea. No sick contacts or recent travel. Systemic Review: General: The patient denies anorexia, fever, weight loss Cardiac: Denies chest pain, syncope, palpitations, pedal edema  Respiratory: Denies cough, shortness of breath, wheezing GI: Denies severe indigestion/heartburn, abdominal pain, nausea, vomiting, diarrhea and constipation GU: Denies hematuria, incontinence, dysuria  Musculoskeletal: Bilateral extremity pain  Skin: Denies suspicious skin lesions Neurologic: Denies focal weakness or numbness, change in vision  Past Medical History:  Diagnosis Date   Acute kidney injury (HCC) 11/07/2019   Anemia    Chronic pain    COVID-19 virus detected 10/22/2019   Depression    HCAP (healthcare-associated pneumonia) 11/07/2019   History of blood transfusion 2013   x 2   HSV infection    Hypoxia    Pneumonia    x 1   Pulmonary edema 11/11/2019   Sepsis (HCC) 11/07/2019   Sickle cell anemia (HCC) 09/2021   gets them frequently   Tachycardia with heart rate 100-120 beats per minute     Vitamin B12 deficiency 12/2018   Vitamin D  deficiency     Past Surgical History:  Procedure Laterality Date   CESAREAN SECTION  2013   x 1   TOOTH EXTRACTION N/A 10/18/2021   Procedure: DENTAL RESTORATION/EXTRACTIONS;  Surgeon: Ascencion Lava, DMD;  Location: [facility] OR;  Service: Oral Surgery;  Laterality: N/A;    No Known Allergies  Family History  Problem Relation Age of Onset   Hypertension Mother    Sickle cell trait Mother    Diabetes Father    Sickle cell trait Father       Prior to Admission medications   Medication Sig Start Date End Date Taking? Authorizing Provider  acetaminophen  (TYLENOL ) 500 MG tablet Take 1,000 mg by mouth every 6 (six) hours as needed for mild pain, moderate pain or headache.   Yes [provider]  folic acid  (FOLVITE ) 1 MG tablet Take 1 mg by mouth daily.   Yes [provider]  ibuprofen  (ADVIL ) 800 MG tablet Take 1 tablet (800 mg total) by mouth every 8 (eight) hours as needed. 12/22/22  Yes Paseda, Folashade R, FNP  methadone  (DOLOPHINE ) 10 MG tablet Take 10 mg by mouth 3 (three) times daily. 03/26/23  Yes [provider]  oxycodone  (ROXICODONE ) 30 MG immediate release tablet Take 30 mg by mouth every 4 (four) hours.   Yes [provider]  valACYclovir  (VALTREX ) 1000 MG tablet Take 1 tablet by mouth daily. 01/15/23  Yes [provider]  hydroxyurea  (HYDREA ) 500 MG capsule Take 1,500 mg by mouth daily. 02/05/23   [provider]  naloxone  (NARCAN ) nasal spray 4 mg/0.1  mL Use as needed for opiod overdose 03/04/22   Paseda, Folashade R, FNP  ondansetron  (ZOFRAN ) 4 MG tablet Take by mouth. 02/28/22   [provider]  Vitamin D , Ergocalciferol , (DRISDOL ) 1.25 MG (50000 UNIT) CAPS capsule Take 1 capsule (50,000 Units total) by mouth every 7 (seven) days. Patient not taking: Reported on 01/12/2023 03/04/22   Paseda, Folashade R, FNP     Physical Exam: Vitals:   06/10/23 1006 06/10/23 1040 06/10/23  1205 06/10/23 1421  BP: 137/78  121/63 120/64  Pulse: 97  95 92  Resp: 16 16 20 14   Temp: 98.1 F (36.7 C)   98 F (36.7 C)  TempSrc: Temporal   Temporal  SpO2: 97%  96% 94%    General: Alert, awake, afebrile, anicteric, not in obvious distress HEENT: Normocephalic and Atraumatic, Mucous membranes pink                PERRLA; EOM intact; No scleral icterus,                 Nares: Patent, Oropharynx: Clear, Fair Dentition                 Neck: FROM, no cervical lymphadenopathy, thyromegaly, carotid bruit or JVD;  CHEST WALL: No tenderness  CHEST: Normal respiration, clear to auscultation bilaterally  HEART: Regular rate and rhythm; no murmurs rubs or gallops  BACK: No kyphosis or scoliosis; no CVA tenderness  ABDOMEN: Positive Bowel Sounds, soft, non-tender; no masses, no organomegaly EXTREMITIES: No cyanosis, clubbing, or edema SKIN:  no rash or ulceration  CNS: Alert and Oriented x 4, Nonfocal exam, CN 2-12 intact  Labs on Admission:  Basic Metabolic Panel: No results for input(s): "NA", "K", "CL", "CO2", "GLUCOSE", "BUN", "CREATININE", "CALCIUM", "MG", "PHOS" in the last 168 hours. Liver Function Tests: No results for input(s): "AST", "ALT", "ALKPHOS", "BILITOT", "PROT", "ALBUMIN" in the last 168 hours. No results for input(s): "LIPASE", "AMYLASE" in the last 168 hours. No results for input(s): "AMMONIA" in the last 168 hours. CBC: Recent Labs  Lab 06/10/23 1030  WBC 7.1  NEUTROABS 4.8  HGB 11.3*  HCT 33.8*  MCV 76.5*  PLT 289   Cardiac Enzymes: No results for input(s): "CKTOTAL", "CKMB", "CKMBINDEX", "TROPONINI" in the last 168 hours.  BNP (last 3 results) No results for input(s): "BNP" in the last 8760 hours.  ProBNP (last 3 results) No results for input(s): "PROBNP" in the last 8760 hours.  CBG: No results for input(s): "GLUCAP" in the last 168 hours.   Assessment/Plan Principal Problem:   Sickle cell anemia with pain (HCC)  Admits to the Day Hospital  for extended observation IVF 0 .45% Saline @ 75 mls/hour Weight based Dilaudid  PCA started within 30 minutes of admission IV Toradol  15 mg X 1 doses Acetaminophen  1000 mg x 1 dose Labs: CBCD, CMP, Retic Count and LDH Monitor vitals very closely, Re-evaluate pain scale every hour 2 L of Oxygen  by [place] Patient will be re-evaluated for pain in the context of function and relationship to baseline as care progresses. If no significant relieve from pain (remains above 5/10) will transfer patient to inpatient services for further evaluation and management  Code Status: Full  Family Communication: None  DVT Prophylaxis: Ambulate as tolerated   Time spent: 35 Minutes  Lorel Roes NP   If 7PM-7AM, please contact night-coverage [email or web address] 06/10/2023, 2:27 PM

## 2023-06-16 ENCOUNTER — Encounter (HOSPITAL_BASED_OUTPATIENT_CLINIC_OR_DEPARTMENT_OTHER): Payer: Self-pay | Admitting: Urology

## 2023-06-16 ENCOUNTER — Emergency Department (HOSPITAL_BASED_OUTPATIENT_CLINIC_OR_DEPARTMENT_OTHER)
Admission: EM | Admit: 2023-06-16 | Discharge: 2023-06-16 | Disposition: A | Discharge status: Home / Self Care | Attending: Emergency Medicine | Admitting: Emergency Medicine

## 2023-06-16 ENCOUNTER — Other Ambulatory Visit: Payer: Self-pay

## 2023-06-16 ENCOUNTER — Telehealth (HOSPITAL_COMMUNITY): Payer: Self-pay

## 2023-06-16 DIAGNOSIS — Z79891 Long term (current) use of opiate analgesic: Secondary | ICD-10-CM | POA: Diagnosis not present

## 2023-06-16 DIAGNOSIS — D5700 Hb-SS disease with crisis, unspecified: Secondary | ICD-10-CM | POA: Diagnosis not present

## 2023-06-16 DIAGNOSIS — R000 Tachycardia, unspecified: Secondary | ICD-10-CM | POA: Diagnosis not present

## 2023-06-16 DIAGNOSIS — M79605 Pain in left leg: Secondary | ICD-10-CM | POA: Diagnosis present

## 2023-06-16 LAB — CBC WITH DIFFERENTIAL/PLATELET
Abs Immature Granulocytes: 0.01 10*3/uL (ref 0.00–0.07)
Basophils Absolute: 0 10*3/uL (ref 0.0–0.1)
Basophils Relative: 0 %
Eosinophils Absolute: 0.1 10*3/uL (ref 0.0–0.5)
Eosinophils Relative: 2 %
HCT: 33.2 % — ABNORMAL LOW (ref 36.0–46.0)
Hemoglobin: 11.6 g/dL — ABNORMAL LOW (ref 12.0–15.0)
Immature Granulocytes: 0 %
Lymphocytes Relative: 26 %
Lymphs Abs: 1.4 10*3/uL (ref 0.7–4.0)
MCH: 25.2 pg — ABNORMAL LOW (ref 26.0–34.0)
MCHC: 34.9 g/dL (ref 30.0–36.0)
MCV: 72.2 fL — ABNORMAL LOW (ref 80.0–100.0)
Monocytes Absolute: 0.4 10*3/uL (ref 0.1–1.0)
Monocytes Relative: 8 %
Neutro Abs: 3.3 10*3/uL (ref 1.7–7.7)
Neutrophils Relative %: 64 %
Platelets: 290 10*3/uL (ref 150–400)
RBC: 4.6 MIL/uL (ref 3.87–5.11)
RDW: 14.3 % (ref 11.5–15.5)
WBC: 5.2 10*3/uL (ref 4.0–10.5)
nRBC: 0 % (ref 0.0–0.2)

## 2023-06-16 LAB — COMPREHENSIVE METABOLIC PANEL WITH GFR
ALT: 17 U/L (ref 0–44)
AST: 22 U/L (ref 15–41)
Albumin: 4.2 g/dL (ref 3.5–5.0)
Alkaline Phosphatase: 69 U/L (ref 38–126)
Anion gap: 7 (ref 5–15)
BUN: 5 mg/dL — ABNORMAL LOW (ref 6–20)
CO2: 26 mmol/L (ref 22–32)
Calcium: 9.6 mg/dL (ref 8.9–10.3)
Chloride: 107 mmol/L (ref 98–111)
Creatinine, Ser: 0.56 mg/dL (ref 0.44–1.00)
GFR, Estimated: >60 mL/min (ref >60)
Glucose, Bld: 102 mg/dL — ABNORMAL HIGH (ref 70–99)
Potassium: 4 mmol/L (ref 3.5–5.1)
Sodium: 140 mmol/L (ref 135–145)
Total Bilirubin: 0.7 mg/dL (ref 0.0–1.2)
Total Protein: 7.4 g/dL (ref 6.5–8.1)

## 2023-06-16 LAB — RETICULOCYTES
Immature Retic Fract: 24 % — ABNORMAL HIGH (ref 2.3–15.9)
RBC.: 4.59 MIL/uL (ref 3.87–5.11)
Retic Count, Absolute: 101.4 10*3/uL (ref 19.0–186.0)
Retic Ct Pct: 2.2 % (ref 0.4–3.1)

## 2023-06-16 LAB — HCG, QUANTITATIVE, PREGNANCY: hCG, Beta Chain, Quant, S: <1 m[IU]/mL (ref <5)

## 2023-06-16 MED ORDER — HYDROMORPHONE HCL 1 MG/ML IJ SOLN: 1.0000 mg | Freq: Once | INTRAMUSCULAR | Status: DC

## 2023-06-16 MED ORDER — HYDROMORPHONE HCL 1 MG/ML IJ SOLN
2.0000 mg | Freq: Once | INTRAMUSCULAR | Status: AC
  Administered 2023-06-16: 2 mg via INTRAVENOUS
  Filled 2023-06-16: qty 2

## 2023-06-16 MED ORDER — KETOROLAC TROMETHAMINE 15 MG/ML IJ SOLN
15.0000 mg | Freq: Once | INTRAMUSCULAR | Status: AC
  Administered 2023-06-16: 15 mg via INTRAVENOUS
  Filled 2023-06-16: qty 1

## 2023-06-16 MED ORDER — ONDANSETRON HCL 4 MG/2ML IJ SOLN
4.0000 mg | Freq: Once | INTRAMUSCULAR | Status: AC
  Administered 2023-06-16: 4 mg via INTRAVENOUS
  Filled 2023-06-16: qty 2

## 2023-06-16 NOTE — Discharge Instructions (Addendum)
 Your labs today were reassuring.  Recommend follow-up with sick cell clinic for reevaluation.  Please not hesitate to return if the worrisome signs and symptoms we discussed become apparent.

## 2023-06-16 NOTE — ED Provider Notes (Signed)
  Physical Exam  BP 119/80   Pulse 79   Temp 98.6 F (37 C) (Oral)   Resp 19   Ht 5\' 5"  (1.651 m)   Wt (!) 157.6 kg   LMP 05/30/2023 (Approximate)   SpO2 98%   BMI 57.82 kg/m     Procedures  .Ultrasound ED Peripheral IV (Provider)  Date/Time: 06/16/2023 10:14 AM  Performed by: Rosealee Concha, MD Authorized by: Rosealee Concha, MD   Procedure details:    Indications: multiple failed IV attempts     Skin Prep: chlorhexidine  gluconate     Location:  Left anterior forearm   Angiocath:  20 G   Bedside Ultrasound Guided: Yes     Images: not archived     Patient tolerated procedure without complications: Yes     Dressing applied: Yes     ED Course / MDM            Rosealee Concha, MD 06/16/23 1015

## 2023-06-16 NOTE — ED Triage Notes (Signed)
 Pt states bilateral sickle cell pain to both legs x 1 week  Taking oxycodone  with no relief

## 2023-06-16 NOTE — ED Notes (Addendum)
 2 unsuccessful IV insertion attempts, MD at bedside to attempt USIV

## 2023-06-16 NOTE — Telephone Encounter (Signed)
 Patient called the day hospital wanting to come in to the day hospital today for sickle cell pain treatment. Pt reports 8/10 pain to bilateral legs. Pt reports taking Oxycodone  30 mg at 6 AM. Pt denies being to the ER recently. Pt screened for COVID and denies symptoms and exposures. Pt denies fever, chest pain, N/V/D, abdominal pain. Marylu Soda, NP notified and per provider pt should try to manage at home today with her home medications, and if needed pt can call the day hospital back in the morning. Pt notified and verbalized understanding.

## 2023-06-16 NOTE — ED Provider Notes (Signed)
 [place] [facility] Provider Note   CSN: [id number] Arrival date & time: 06/16/23  0900     History  Chief Complaint  Patient presents with   Sickle Cell Pain Crisis    Stephanie Burch is a 43 y.o. female.   Sickle Cell Pain Crisis   43 year old female presents emergency department with complaints of bilateral leg pain.  States that she has been having pain from both of her hips down to her knees on both sides.  States that she has a history of sickle cell anemia and this kind of pain is typical of sickle cell pain crises in the past.  States that she has been having symptoms for the past week or so.  States she is on methadone  as well as oxycodone  for her pain at home.  She has been taking medication without significant improvement of symptoms.  Does state that she ran out of her oxycodone  last night and has upcoming refill on Thursday.  States that she was feeling well upon prior discharge on 06/10/2023 from the sickle cell clinic.  Went to [facility] on 06/12/2023 due to current symptoms but states she did not get relief from medications given at that time.  Denies any known fall/injuries.  Denies any weakness/sensory deficits in lower extremities.  States she would like to go home if her pain gets under control while in the ED.  Past medical history significant for sickle cell anemia, chronic pain, opiate dependency  Home Medications Prior to Admission medications   Medication Sig Start Date End Date Taking? Authorizing Provider  acetaminophen  (TYLENOL ) 500 MG tablet Take 1,000 mg by mouth every 6 (six) hours as needed for mild pain, moderate pain or headache.    [provider]  folic acid  (FOLVITE ) 1 MG tablet Take 1 mg by mouth daily.    [provider]  hydroxyurea  (HYDREA ) 500 MG capsule Take 1,500 mg by mouth daily. 02/05/23   [provider]  ibuprofen  (ADVIL ) 800 MG tablet Take 1 tablet (800 mg total) by mouth  every 8 (eight) hours as needed. 12/22/22   Paseda, Folashade R, FNP  methadone  (DOLOPHINE ) 10 MG tablet Take 10 mg by mouth 3 (three) times daily. 03/26/23   [provider]  naloxone  (NARCAN ) nasal spray 4 mg/0.1 mL Use as needed for opiod overdose 03/04/22   Paseda, Folashade R, FNP  ondansetron  (ZOFRAN ) 4 MG tablet Take by mouth. 02/28/22   [provider]  oxycodone  (ROXICODONE ) 30 MG immediate release tablet Take 30 mg by mouth every 4 (four) hours.    [provider]  valACYclovir  (VALTREX ) 1000 MG tablet Take 1 tablet by mouth daily. 01/15/23   [provider]  Vitamin D , Ergocalciferol , (DRISDOL ) 1.25 MG (50000 UNIT) CAPS capsule Take 1 capsule (50,000 Units total) by mouth every 7 (seven) days. Patient not taking: Reported on 01/12/2023 03/04/22   Paseda, Folashade R, FNP      Allergies    Patient has no known allergies.    Review of Systems   Review of Systems  All other systems reviewed and are negative.   Physical Exam Updated Vital Signs BP (!) 139/95 (BP Location: Right Arm)   Pulse (!) 102   Temp 98.6 F (37 C) (Oral)   Resp 18   Ht 5\' 5"  (1.651 m)   Wt (!) 157.6 kg   LMP 05/30/2023 (Approximate)   SpO2 94%   BMI 57.82 kg/m  Physical Exam Vitals  and nursing note reviewed.  Constitutional:      General: She is not in acute distress.    Appearance: She is well-developed.  HENT:     Head: Normocephalic and atraumatic.  Eyes:     Conjunctiva/sclera: Conjunctivae normal.  Cardiovascular:     Rate and Rhythm: Normal rate and regular rhythm.     Heart sounds: No murmur heard. Pulmonary:     Effort: Pulmonary effort is normal. No respiratory distress.     Breath sounds: Normal breath sounds.  Abdominal:     Palpations: Abdomen is soft.     Tenderness: There is no abdominal tenderness.  Musculoskeletal:        General: No swelling.     Cervical back: Neck supple.     Comments: Patient observed ambulating to room without  difficulty or obvious gait deviation.  Tender palpation bilateral proximal hips.  Able to range hips and knees fully while in exam room bed.  Pedal and posterior tibial pulses 2+ bilaterally.  No overlying erythema, palpable fluctuance/induration.  Muscular strength 5 out of 5 lower extremities.  Skin:    General: Skin is warm and dry.     Capillary Refill: Capillary refill takes less than 2 seconds.  Neurological:     Mental Status: She is alert.  Psychiatric:        Mood and Affect: Mood normal.    ED Results / Procedures / Treatments   Labs (all labs ordered are listed, but only abnormal results are displayed) Labs Reviewed  COMPREHENSIVE METABOLIC PANEL WITH GFR  CBC WITH DIFFERENTIAL/PLATELET  HCG, QUANTITATIVE, PREGNANCY  RETICULOCYTES    EKG None  Radiology No results found.  Procedures Procedures    Medications Ordered in ED Medications - No data to display  ED Course/ Medical Decision Making/ A&P                                 Medical Decision Making Amount and/or Complexity of Data Reviewed Labs: ordered.  Risk Prescription drug management.   This patient presents to the ED for concern of lower extremity pain, this involves an extensive number of treatment options, and is a complaint that carries with it a high risk of complications and morbidity.  The differential diagnosis includes sickle cell pain crisis, aplastic crisis, ischemic limb, cellulitis, fracture, dislocation, Supples, negative infection, DVT, other   Co morbidities that complicate the patient evaluation  See HPI   Additional history obtained:  Additional history obtained from EMR External records from outside source obtained and reviewed including hospital records   Lab Tests:  I Ordered, and personally interpreted labs.  The pertinent results include: No leukocytosis.  Anemia with a hemoglobin of 11.6 of which is microcytic in nature.  Platelets within normal range.  Reticulocyte  count percentage normal.  Absolute reticulocyte count normal.  Immature reticulocyte fraction increased of 24.  hCG negative.  No electrolyte abnormalities.  No transaminitis.  No renal dysfunction.   Imaging Studies ordered:  N/a   Cardiac Monitoring: / EKG:  The patient was maintained on a cardiac monitor.  I personally viewed and interpreted the cardiac monitored which showed an underlying rhythm of: sinus rhythm   Consultations Obtained:  N/a   Problem List / ED Course / Critical interventions / Medication management  Sickle cell anemia with pain I ordered medication including Zofran , Toradol , Dilaudid   Reevaluation of the patient after these medicines showed that  the patient improved I have reviewed the patients home medicines and have made adjustments as needed   Social Determinants of Health:  Chronic opiate use.  Denies tobacco or other substance use.   Test / Admission - Considered:  Sickle cell anemia with pain Vitals signs significant for tachycardia heart rate 102. Otherwise within normal range and stable throughout visit. Laboratory studies significant for: See above 43 year old female presents emergency department with complaints of bilateral leg pain.  States that she has been having pain from both of her hips down to her knees on both sides.  States that she has a history of sickle cell anemia and this kind of pain is typical of sickle cell pain crises in the past.  States that she has been having symptoms for the past week or so.  States she is on methadone  as well as oxycodone  for her pain at home.  She has been taking medication without significant improvement of symptoms.  Does state that she ran out of her oxycodone  last night and has upcoming refill on Thursday.  States that she was feeling well upon prior discharge on 06/10/2023 from the sickle cell clinic.  Went to [facility] on 06/12/2023 due to current symptoms but states she did not get relief from  medications given at that time.  Denies any known fall/injuries.  Denies any weakness/sensory deficits in lower extremities.  States she would like to go home if her pain gets under control while in the ED. On exam, patient able to ambulate independently without gait deviation.  No pulse deficits suggest ischemic limb.  No overlying skin changes concerning for second infectious process.  No traumatic injury concerning for fracture or dislocation.  Patient reported history of sickle cell anemia with pain in similar location.  Workup today reassuring.  No evidence of aplastic crisis.  Patient without complaints of chest pain, cough; low suspicion for acute chest syndrome.  Treated with medications and did note improvement of symptoms while in the ED with patient subsequently feeling like she can manage her symptoms at home..  Recommend follow-up with sickle cell specialist in the outpatient setting.  She will plan discussed with patient and she acknowledged understanding was agreeable to said plan.  Patient well-appearing, afebrile in no acute distress. Worrisome signs and symptoms were discussed with the patient, and the patient acknowledged understanding to return to the ED if noticed. Patient was stable upon discharge.          Final Clinical Impression(s) / ED Diagnoses Final diagnoses:  None    Rx / DC Orders ED Discharge Orders     None         [place] Butter, [place] 06/16/23 1128    Rosealee Concha, MD 06/16/23 1218

## 2023-06-17 ENCOUNTER — Emergency Department (HOSPITAL_BASED_OUTPATIENT_CLINIC_OR_DEPARTMENT_OTHER)
Admission: EM | Admit: 2023-06-17 | Discharge: 2023-06-17 | Disposition: A | Discharge status: Home / Self Care | Attending: Emergency Medicine | Admitting: Emergency Medicine

## 2023-06-17 ENCOUNTER — Other Ambulatory Visit: Payer: Self-pay

## 2023-06-17 DIAGNOSIS — G894 Chronic pain syndrome: Secondary | ICD-10-CM | POA: Insufficient documentation

## 2023-06-17 DIAGNOSIS — M79604 Pain in right leg: Secondary | ICD-10-CM | POA: Insufficient documentation

## 2023-06-17 DIAGNOSIS — M79602 Pain in left arm: Secondary | ICD-10-CM | POA: Insufficient documentation

## 2023-06-17 DIAGNOSIS — M79605 Pain in left leg: Secondary | ICD-10-CM | POA: Diagnosis not present

## 2023-06-17 LAB — COMPREHENSIVE METABOLIC PANEL WITH GFR
ALT: 16 U/L (ref 0–44)
AST: 14 U/L — ABNORMAL LOW (ref 15–41)
Albumin: 4 g/dL (ref 3.5–5.0)
Alkaline Phosphatase: 81 U/L (ref 38–126)
Anion gap: 8 (ref 5–15)
BUN: <5 mg/dL — ABNORMAL LOW (ref 6–20)
CO2: 27 mmol/L (ref 22–32)
Calcium: 9.3 mg/dL (ref 8.9–10.3)
Chloride: 107 mmol/L (ref 98–111)
Creatinine, Ser: 0.62 mg/dL (ref 0.44–1.00)
GFR, Estimated: >60 mL/min (ref >60)
Glucose, Bld: 89 mg/dL (ref 70–99)
Potassium: 4.6 mmol/L (ref 3.5–5.1)
Sodium: 142 mmol/L (ref 135–145)
Total Bilirubin: 0.6 mg/dL (ref 0.0–1.2)
Total Protein: 7.3 g/dL (ref 6.5–8.1)

## 2023-06-17 LAB — CBC WITH DIFFERENTIAL/PLATELET
Abs Immature Granulocytes: 0.01 10*3/uL (ref 0.00–0.07)
Basophils Absolute: 0 10*3/uL (ref 0.0–0.1)
Basophils Relative: 1 %
Eosinophils Absolute: 0.1 10*3/uL (ref 0.0–0.5)
Eosinophils Relative: 1 %
HCT: 33.4 % — ABNORMAL LOW (ref 36.0–46.0)
Hemoglobin: 11.6 g/dL — ABNORMAL LOW (ref 12.0–15.0)
Immature Granulocytes: 0 %
Lymphocytes Relative: 31 %
Lymphs Abs: 1.9 10*3/uL (ref 0.7–4.0)
MCH: 25.2 pg — ABNORMAL LOW (ref 26.0–34.0)
MCHC: 34.7 g/dL (ref 30.0–36.0)
MCV: 72.6 fL — ABNORMAL LOW (ref 80.0–100.0)
Monocytes Absolute: 0.4 10*3/uL (ref 0.1–1.0)
Monocytes Relative: 7 %
Neutro Abs: 3.6 10*3/uL (ref 1.7–7.7)
Neutrophils Relative %: 60 %
Platelets: 272 10*3/uL (ref 150–400)
RBC: 4.6 MIL/uL (ref 3.87–5.11)
RDW: 14.3 % (ref 11.5–15.5)
WBC: 6 10*3/uL (ref 4.0–10.5)
nRBC: 0 % (ref 0.0–0.2)

## 2023-06-17 LAB — RETICULOCYTES
Immature Retic Fract: 24.7 % — ABNORMAL HIGH (ref 2.3–15.9)
RBC.: 4.63 MIL/uL (ref 3.87–5.11)
Retic Count, Absolute: 95.4 10*3/uL (ref 19.0–186.0)
Retic Ct Pct: 2.1 % (ref 0.4–3.1)

## 2023-06-17 MED ORDER — KETOROLAC TROMETHAMINE 15 MG/ML IJ SOLN
15.0000 mg | Freq: Once | INTRAMUSCULAR | Status: AC
  Administered 2023-06-17: 15 mg via INTRAVENOUS
  Filled 2023-06-17: qty 1

## 2023-06-17 MED ORDER — KETOROLAC TROMETHAMINE 15 MG/ML IJ SOLN
15.0000 mg | Freq: Once | INTRAMUSCULAR | Status: DC
  Filled 2023-06-17: qty 1

## 2023-06-17 MED ORDER — HYDROMORPHONE HCL 1 MG/ML IJ SOLN: 1.0000 mg | Freq: Once | INTRAMUSCULAR | Status: DC

## 2023-06-17 MED ORDER — HYDROMORPHONE HCL 1 MG/ML IJ SOLN
2.0000 mg | Freq: Once | INTRAMUSCULAR | Status: AC
  Administered 2023-06-17: 2 mg via INTRAVENOUS
  Filled 2023-06-17: qty 2

## 2023-06-17 MED ORDER — HYDROMORPHONE HCL 1 MG/ML IJ SOLN
2.0000 mg | Freq: Once | INTRAMUSCULAR | Status: AC
  Administered 2023-06-17: 2 mg via INTRAMUSCULAR
  Filled 2023-06-17: qty 2

## 2023-06-17 NOTE — ED Notes (Addendum)
 Pt has asked to speak to provider before Toradol  administered EDP at bedside

## 2023-06-17 NOTE — ED Triage Notes (Signed)
 Pt c/o sickle cell pain crisis. Seen in this ED yesterday, tx helped temporarily but pain came back worse throughout the night.  Pt states she is due to pickup her routine pain meds tomorrow but pain in bilateral legs and left arm today is "unbearable."  Pt denies n/v, AAOx4 in triage.

## 2023-06-17 NOTE — ED Provider Notes (Signed)
 [facility] Provider Note   CSN: [id number] Arrival date & time: 06/17/23  1191     History  Chief Complaint  Patient presents with   Sickle Cell Pain Crisis    Stephanie Burch is a 43 y.o. female.  The history is provided by the patient and medical records. No language interpreter was used.  Sickle Cell Pain Crisis    43 year old female with history of sickle cell anemia, depression, chronic pain currently on methadone  and oxycodone  at home presenting with complaints of sickle cell related pain.  Patient states for the past week she has had aches and pain to her left arm and bilateral legs.  Pain felt similar to prior sickle cell crisis.  She was seen in the ER yesterday for her pain and did receive treatment.  It did help but last night her pain returns and intensified.  She did try to reach out to the sickle cell clinic this morning but was told that there is no space available for her and therefore she is here requesting for pain management.  She does not endorse any fever chills no chest pain no shortness of breath no nausea vomiting or diarrhea.  She mention she ran out of her oxycodone  last night but does have a medication refill tomorrow.  She attributed her pain is due to recent travel up to [place]  and with weather changes.  No chest pain no trouble breathing  Home Medications Prior to Admission medications   Medication Sig Start Date End Date Taking? Authorizing Provider  acetaminophen  (TYLENOL ) 500 MG tablet Take 1,000 mg by mouth every 6 (six) hours as needed for mild pain, moderate pain or headache.    [provider]  folic acid  (FOLVITE ) 1 MG tablet Take 1 mg by mouth daily.    [provider]  hydroxyurea  (HYDREA ) 500 MG capsule Take 1,500 mg by mouth daily. 02/05/23   [provider]  ibuprofen  (ADVIL ) 800 MG tablet Take 1 tablet (800 mg total) by mouth every 8 (eight) hours as needed. 12/22/22    Paseda, Folashade R, FNP  methadone  (DOLOPHINE ) 10 MG tablet Take 10 mg by mouth 3 (three) times daily. 03/26/23   [provider]  naloxone  (NARCAN ) nasal spray 4 mg/0.1 mL Use as needed for opiod overdose 03/04/22   Paseda, Folashade R, FNP  ondansetron  (ZOFRAN ) 4 MG tablet Take by mouth. 02/28/22   [provider]  oxycodone  (ROXICODONE ) 30 MG immediate release tablet Take 30 mg by mouth every 4 (four) hours.    [provider]  valACYclovir  (VALTREX ) 1000 MG tablet Take 1 tablet by mouth daily. 01/15/23   [provider]  Vitamin D , Ergocalciferol , (DRISDOL ) 1.25 MG (50000 UNIT) CAPS capsule Take 1 capsule (50,000 Units total) by mouth every 7 (seven) days. Patient not taking: Reported on 01/12/2023 03/04/22   Paseda, Folashade R, FNP      Allergies    Patient has no known allergies.    Review of Systems   Review of Systems  All other systems reviewed and are negative.   Physical Exam Updated Vital Signs BP (!) 120/97 (BP Location: Right Arm)   Pulse 82   Temp 98.8 F (37.1 C) (Oral)   Resp 16   LMP 05/30/2023 (Approximate)   SpO2 98%  Physical Exam Vitals and nursing note reviewed.  Constitutional:      General: She is not in acute distress.    Appearance: She is well-developed.  She is obese.  HENT:     Head: Atraumatic.  Eyes:     Conjunctiva/sclera: Conjunctivae normal.  Cardiovascular:     Rate and Rhythm: Normal rate and regular rhythm.     Pulses: Normal pulses.     Heart sounds: Normal heart sounds.  Musculoskeletal:        General: Tenderness (Tenderness will palpate left arm and bilateral legs without focal point tenderness or overlying skin changes.  Full range of motion throughout) present.     Cervical back: Neck supple.  Skin:    Findings: No rash.  Neurological:     Mental Status: She is alert. Mental status is at baseline.     ED Results / Procedures / Treatments   Labs (all labs ordered are listed, but only  abnormal results are displayed) Labs Reviewed  CBC WITH DIFFERENTIAL/PLATELET - Abnormal; Notable for the following components:      Result Value   Hemoglobin 11.6 (*)    HCT 33.4 (*)    MCV 72.6 (*)    MCH 25.2 (*)    All other components within normal limits  RETICULOCYTES - Abnormal; Notable for the following components:   Immature Retic Fract 24.7 (*)    All other components within normal limits  COMPREHENSIVE METABOLIC PANEL WITH GFR - Abnormal; Notable for the following components:   BUN <5 (*)    AST 14 (*)    All other components within normal limits    EKG None  Radiology No results found.  Procedures Procedures    Medications Ordered in ED Medications  HYDROmorphone  (DILAUDID ) injection 1 mg (has no administration in time range)    ED Course/ Medical Decision Making/ A&P                                 Medical Decision Making Amount and/or Complexity of Data Reviewed Labs: ordered.  Risk Prescription drug management.   BP (!) 120/97 (BP Location: Right Arm)   Pulse 82   Temp 98.8 F (37.1 C) (Oral)   Resp 16   LMP 05/30/2023 (Approximate)   SpO2 98%   65:54 AM   43 year old female with history of sickle cell anemia, depression, chronic pain currently on methadone  and oxycodone  at home presenting with complaints of sickle cell related pain.  Patient states for the past week she has had aches and pain to her left arm and bilateral legs.  Pain felt similar to prior sickle cell crisis.  She was seen in the ER yesterday for her pain and did receive treatment.  It did help but last night her pain returns and intensified.  She did try to reach out to the sickle cell clinic this morning but was told that there is no space available for her and therefore she is here requesting for pain management.  She does not endorse any fever chills no chest pain no shortness of breath no nausea vomiting or diarrhea.  She mention she ran out of her oxycodone  last night but  does have a medication refill tomorrow.  She attributed her pain is due to recent travel up to [place]  and with weather changes.  No chest pain no trouble breathing  On exam patient is tearful but nontoxic in appearance.  She has some tenderness to palpation of left arm and bilateral legs and hips without overlying skin changes.  Her vital signs are overall reassuring no fever no hypoxia.  EMR reviewed patient was seen in the ED yesterday for same presentation.  She did receive opiate pain medication during her visit.  Her labs was obtained independently viewed interpreted by me and overall reassuring no significant anemia no electrolyte imbalance  At this time, I felt additional blood work is not indicated.  I have agreed to provide patient with a dose of opiate pain medication for symptom but felt she can refill her opiate medication tomorrow and to follow-up with the sickle cell clinic for further care.  I suspect her pain is more likely to be chronic pain syndrome and less likely to be a sickle cell crisis.  I have low suspicion for any infectious symptoms causing symptoms.  I have low suspicion for DVT causing her symptoms.  No evidence to suggest acute chest syndrome.  After receiving Dilaudid  IM patient states her pain is mildly improved but she felt she had additional treatments.  She also request for labs to be obtained again.  Has been moderate amount of time talking to patient and agrees to recheck her labs.  Will order additional pain medication.  Labs obtained independent viewed interpreted by me and overall reassuring no electrolyte derangement no anemia no evidence of aplastic anemia.  Patient received several dose of pain medication with improvement of symptoms.  Patient is stable for discharge.  Outpatient follow-up recommended.        Final Clinical Impression(s) / ED Diagnoses Final diagnoses:  Chronic pain syndrome    Rx / DC Orders ED Discharge Orders     None          Debbra Fairy, PA-C 06/17/23 1441    Hershel Los, MD 06/17/23 [phone]

## 2023-06-17 NOTE — Discharge Instructions (Addendum)
 Fortunately your blood work did not show any concerning finding.  Please call and follow-up closely with your doctor for further managements of your condition.

## 2023-07-13 ENCOUNTER — Telehealth (HOSPITAL_COMMUNITY): Payer: Self-pay

## 2023-07-13 ENCOUNTER — Non-Acute Institutional Stay (HOSPITAL_COMMUNITY)
Admission: AD | Admit: 2023-07-13 | Discharge: 2023-07-13 | Disposition: A | Discharge status: Home / Self Care | Source: Ambulatory Visit | Attending: Internal Medicine | Admitting: Internal Medicine

## 2023-07-13 DIAGNOSIS — F1120 Opioid dependence, uncomplicated: Secondary | ICD-10-CM | POA: Insufficient documentation

## 2023-07-13 DIAGNOSIS — Z79899 Other long term (current) drug therapy: Secondary | ICD-10-CM | POA: Insufficient documentation

## 2023-07-13 DIAGNOSIS — D638 Anemia in other chronic diseases classified elsewhere: Secondary | ICD-10-CM | POA: Insufficient documentation

## 2023-07-13 DIAGNOSIS — D5700 Hb-SS disease with crisis, unspecified: Secondary | ICD-10-CM | POA: Diagnosis present

## 2023-07-13 DIAGNOSIS — E669 Obesity, unspecified: Secondary | ICD-10-CM | POA: Diagnosis not present

## 2023-07-13 DIAGNOSIS — G894 Chronic pain syndrome: Secondary | ICD-10-CM | POA: Diagnosis not present

## 2023-07-13 DIAGNOSIS — Z79624 Long term (current) use of inhibitors of nucleotide synthesis: Secondary | ICD-10-CM | POA: Diagnosis not present

## 2023-07-13 LAB — CBC WITH DIFFERENTIAL/PLATELET
Abs Immature Granulocytes: 0.02 10*3/uL (ref 0.00–0.07)
Basophils Absolute: 0 10*3/uL (ref 0.0–0.1)
Basophils Relative: 0 %
Eosinophils Absolute: 0.2 10*3/uL (ref 0.0–0.5)
Eosinophils Relative: 3 %
HCT: 34.4 % — ABNORMAL LOW (ref 36.0–46.0)
Hemoglobin: 11.6 g/dL — ABNORMAL LOW (ref 12.0–15.0)
Immature Granulocytes: 0 %
Lymphocytes Relative: 30 %
Lymphs Abs: 2.4 10*3/uL (ref 0.7–4.0)
MCH: 24.5 pg — ABNORMAL LOW (ref 26.0–34.0)
MCHC: 33.7 g/dL (ref 30.0–36.0)
MCV: 72.7 fL — ABNORMAL LOW (ref 80.0–100.0)
Monocytes Absolute: 0.7 10*3/uL (ref 0.1–1.0)
Monocytes Relative: 9 %
Neutro Abs: 4.7 10*3/uL (ref 1.7–7.7)
Neutrophils Relative %: 58 %
Platelets: 282 10*3/uL (ref 150–400)
RBC: 4.73 MIL/uL (ref 3.87–5.11)
RDW: 14.6 % (ref 11.5–15.5)
WBC: 8 10*3/uL (ref 4.0–10.5)
nRBC: 0 % (ref 0.0–0.2)

## 2023-07-13 LAB — RETICULOCYTES
Immature Retic Fract: 26.2 % — ABNORMAL HIGH (ref 2.3–15.9)
RBC.: 4.71 MIL/uL (ref 3.87–5.11)
Retic Count, Absolute: 136.1 10*3/uL (ref 19.0–186.0)
Retic Ct Pct: 2.9 % (ref 0.4–3.1)

## 2023-07-13 LAB — LACTATE DEHYDROGENASE: LDH: 152 U/L (ref 98–192)

## 2023-07-13 MED ORDER — ACETAMINOPHEN 500 MG PO TABS
1000.0000 mg | ORAL_TABLET | Freq: Once | ORAL | Status: AC
  Administered 2023-07-13: 1000 mg via ORAL
  Filled 2023-07-13: qty 2

## 2023-07-13 MED ORDER — NALOXONE HCL 0.4 MG/ML IJ SOLN: 0.4000 mg | INTRAMUSCULAR | Status: DC | PRN

## 2023-07-13 MED ORDER — SODIUM CHLORIDE 0.9% FLUSH: 9.0000 mL | INTRAVENOUS | Status: DC | PRN

## 2023-07-13 MED ORDER — HYDROMORPHONE 1 MG/ML IV SOLN
INTRAVENOUS | Status: DC
  Administered 2023-07-13: 14.5 mg via INTRAVENOUS
  Administered 2023-07-13: 30 mg via INTRAVENOUS
  Filled 2023-07-13: qty 30

## 2023-07-13 MED ORDER — SENNOSIDES-DOCUSATE SODIUM 8.6-50 MG PO TABS: 1.0000 | ORAL_TABLET | Freq: Two times a day (BID) | ORAL | Status: DC

## 2023-07-13 MED ORDER — DIPHENHYDRAMINE HCL 25 MG PO CAPS
25.0000 mg | ORAL_CAPSULE | ORAL | Status: DC | PRN
  Administered 2023-07-13: 25 mg via ORAL
  Filled 2023-07-13: qty 1

## 2023-07-13 MED ORDER — SODIUM CHLORIDE 0.45 % IV SOLN: INTRAVENOUS | Status: DC

## 2023-07-13 MED ORDER — ONDANSETRON HCL 4 MG/2ML IJ SOLN
4.0000 mg | Freq: Four times a day (QID) | INTRAMUSCULAR | Status: DC | PRN
  Administered 2023-07-13: 4 mg via INTRAVENOUS
  Filled 2023-07-13: qty 2

## 2023-07-13 MED ORDER — KETOROLAC TROMETHAMINE 15 MG/ML IJ SOLN: 15.0000 mg | Freq: Four times a day (QID) | INTRAMUSCULAR | Status: DC

## 2023-07-13 MED ORDER — POLYETHYLENE GLYCOL 3350 17 G PO PACK: 17.0000 g | PACK | Freq: Every day | ORAL | Status: DC | PRN

## 2023-07-13 NOTE — Discharge Summary (Signed)
 Physician Discharge Summary  Stephanie Burch JYN:[id number] DOB: [birth year] DOA: 07/13/2023  PCP: Patient, No Pcp Per  Admit date: 07/13/2023  Discharge date: 07/13/2023  Time spent: 30 minutes  Discharge Diagnoses:  Principal Problem:   Sickle cell anemia with pain (HCC)   Discharge Condition: Stable  Diet recommendation: Regular  History of present illness:   Stephanie Burch is a 43 y.o. female with history of sickle cell disease, chronic pain syndrome, opiate dependence and tolerance, obesity, and anemia of chronic disease presents with complaints of generalized pain, that is consistent with her typical pain crisis. Patient says that pain intensity has been elevated over the past few days and unrelieved by her home medications. Pain is worse in bilateral extremities and back. Pain precipitated from driving long distance with her friends. Patient last had oxycodone  this am without sustained relief. Patient rates pain as 9/10, constant and throbbing. Patient denies fever, chills, chest pain, shortness of breath, nausea, vomiting, or diarrhea. No sick contacts or recent travel.   Hospital Course:  Stephanie Burch was admitted to the day hospital with sickle cell painful crisis. Patient was treated with IV fluid, weight based IV Dilaudid  PCA, IV Toradol , clinician assisted doses as deemed appropriate, and other adjunct therapies per sickle cell pain management protocol. Stephanie Burch showed significant improvement symptomatically, pain improved from 10/10 to 7/10 at the time of discharge. Patient was discharged home in a hemodynamically stable condition. Stephanie Burch will follow-up at the clinic as previously scheduled, continue with home medications as per prior to admission.  Discharge Instructions We discussed the need for good hydration, monitoring of hydration status, avoidance of heat, cold, stress, and infection triggers. We discussed the need to be compliant with taking Hydrea  and other home  medications. Stephanie Burch was reminded of the need to seek medical attention immediately if any symptom of bleeding, anemia, or infection occurs.  Discharge Exam: Vitals:   07/13/23 1410 07/13/23 1542  BP: 107/65 97/61  Pulse: 90 89  Resp: 13 14  Temp:    SpO2: 93% 95%    General appearance: alert, cooperative and no distress Eyes: conjunctivae/corneas clear. PERRL, EOM's intact. Fundi benign. Neck: no adenopathy, no carotid bruit, no JVD, supple, symmetrical, trachea midline and thyroid  not enlarged, symmetric, no tenderness/mass/nodules Back: symmetric, no curvature. ROM normal. No CVA tenderness. Resp: clear to auscultation bilaterally Chest wall: no tenderness Cardio: regular rate and rhythm, S1, S2 normal, no murmur, click, rub or gallop GI: soft, non-tender; bowel sounds normal; no masses,  no organomegaly Extremities: extremities normal, atraumatic, no cyanosis or edema Pulses: 2+ and symmetric Skin: Skin color, texture, turgor normal. No rashes or lesions Neurologic: Grossly normal  Discharge Instructions     Diet - low sodium heart healthy   Complete by: As directed    Increase activity slowly   Complete by: As directed       Allergies as of 07/13/2023   No Known Allergies      Medication List     TAKE these medications    acetaminophen  500 MG tablet Commonly known as: TYLENOL  Take 1,000 mg by mouth every 6 (six) hours as needed for mild pain, moderate pain or headache.   folic acid  1 MG tablet Commonly known as: FOLVITE  Take 1 mg by mouth daily.   hydroxyurea  500 MG capsule Commonly known as: HYDREA  Take 1,500 mg by mouth daily.   ibuprofen  800 MG tablet Commonly known as: ADVIL  Take 1 tablet (800 mg total) by mouth every 8 (eight)  hours as needed.   methadone  10 MG tablet Commonly known as: DOLOPHINE  Take 10 mg by mouth 3 (three) times daily.   naloxone  4 MG/0.1ML Liqd nasal spray kit Commonly known as: NARCAN  Use as needed for opiod overdose    ondansetron  4 MG tablet Commonly known as: ZOFRAN  Take by mouth.   oxycodone  30 MG immediate release tablet Commonly known as: ROXICODONE  Take 30 mg by mouth every 4 (four) hours.   valACYclovir  1000 MG tablet Commonly known as: VALTREX  Take 1 tablet by mouth daily.   Vitamin D  (Ergocalciferol ) 1.25 MG (50000 UNIT) Caps capsule Commonly known as: DRISDOL  Take 1 capsule (50,000 Units total) by mouth every 7 (seven) days.       No Known Allergies   Significant Diagnostic Studies: No results found.  Signed:  Lorel Roes NP   07/13/2023, 3:47 PM

## 2023-07-13 NOTE — Telephone Encounter (Signed)
 Pt called the day hospital wanting to come in today for sickle cell pain treatment. Pt reports 8/10 pain to bilateral legs. Pt states that she took Oxycodone  30 mg and Methadone  10 mg at 630 AM. Pt denies being to the ER recently. Pt screened for COVID and denies symptoms and exposures. Pt denies fever, chest pain, N/V/D, abdominal pain. Marylu Soda, NP notified and per provider pt can come to the day hospital today. Pt notified and verbalized understanding. Per pt her friend will be her transportation today.

## 2023-07-13 NOTE — H&P (Addendum)
 [facility] History and Physical  Stephanie Burch:[id number] DOB: 06/08/1980 DOA: 07/13/2023  PCP: Stephanie Burch, No Pcp Per   Chief Complaint:  Chief Complaint  Stephanie Burch presents with   Sickle Cell Pain Crisis    HPI: Stephanie Burch is a 43 y.o. female with history of sickle cell disease, chronic pain syndrome, opiate dependence and tolerance, obesity, and anemia of chronic disease presents with complaints of generalized pain, that is consistent with her typical pain crisis. Stephanie Burch says that pain intensity has been elevated over the past few days and unrelieved by her home medications. Pain is worse in bilateral extremities and back. Pain precipitated from driving long distance with her friends. Stephanie Burch last had oxycodone  this am without sustained relief. Stephanie Burch rates pain as 9/10, constant and throbbing. Stephanie Burch denies fever, chills, chest pain, shortness of breath, nausea, vomiting, or diarrhea. No sick contacts or recent travel.   Systemic Review: General: The Stephanie Burch denies anorexia, fever, weight loss Cardiac: Denies chest pain, syncope, palpitations, pedal edema  Respiratory: Denies cough, shortness of breath, wheezing GI: Denies severe indigestion/heartburn, abdominal pain, nausea, vomiting, diarrhea and constipation GU: Denies hematuria, incontinence, dysuria  Musculoskeletal: Bilateral lower extremity, lower back tenderness.   Skin: Denies suspicious skin lesions Neurologic: Denies focal weakness or numbness, change in vision  Past Medical History:  Diagnosis Date   Acute kidney injury (HCC) 11/07/2019   Anemia    Chronic pain    COVID-19 virus detected 10/22/2019   Depression    HCAP (healthcare-associated pneumonia) 11/07/2019   History of blood transfusion 2013   x 2   HSV infection    Hypoxia    Pneumonia    x 1   Pulmonary edema 11/11/2019   Sepsis (HCC) 11/07/2019   Sickle cell anemia (HCC) 09/2021   gets them frequently   Tachycardia with heart rate  100-120 beats per minute    Vitamin B12 deficiency 12/2018   Vitamin D  deficiency     Past Surgical History:  Procedure Laterality Date   CESAREAN SECTION  2013   x 1   TOOTH EXTRACTION N/A 10/18/2021   Procedure: DENTAL RESTORATION/EXTRACTIONS;  Surgeon: Ascencion Lava, DMD;  Location: [facility] OR;  Service: Oral Surgery;  Laterality: N/A;    No Known Allergies  Family History  Problem Relation Age of Onset   Hypertension Mother    Sickle cell trait Mother    Diabetes Father    Sickle cell trait Father       Prior to Admission medications   Medication Sig Start Date End Date Taking? Authorizing Provider  ibuprofen  (ADVIL ) 800 MG tablet Take 1 tablet (800 mg total) by mouth every 8 (eight) hours as needed. 12/22/22  Yes Paseda, Folashade R, FNP  methadone  (DOLOPHINE ) 10 MG tablet Take 10 mg by mouth 3 (three) times daily. 03/26/23  Yes [provider]  oxycodone  (ROXICODONE ) 30 MG immediate release tablet Take 30 mg by mouth every 4 (four) hours.   Yes [provider]  valACYclovir  (VALTREX ) 1000 MG tablet Take 1 tablet by mouth daily. 01/15/23  Yes [provider]  acetaminophen  (TYLENOL ) 500 MG tablet Take 1,000 mg by mouth every 6 (six) hours as needed for mild pain, moderate pain or headache.    [provider]  folic acid  (FOLVITE ) 1 MG tablet Take 1 mg by mouth daily.    [provider]  hydroxyurea  (HYDREA ) 500 MG capsule Take 1,500 mg by mouth daily. 02/05/23   [provider]  naloxone  (NARCAN ) nasal spray 4 mg/0.1 mL Use as needed for opiod overdose 03/04/22   Paseda, Folashade R, FNP  ondansetron  (ZOFRAN ) 4 MG tablet Take by mouth. 02/28/22   [provider]  Vitamin D , Ergocalciferol , (DRISDOL ) 1.25 MG (50000 UNIT) CAPS capsule Take 1 capsule (50,000 Units total) by mouth every 7 (seven) days. Stephanie Burch not taking: Reported on 01/12/2023 03/04/22   Paseda, Folashade R, FNP     Physical Exam: Vitals:   07/13/23 1019  07/13/23 1043 07/13/23 1157  BP: 130/80  121/70  Pulse: 87  80  Resp: 16 16 16   Temp: 98 F (36.7 C)  98.1 F (36.7 C)  TempSrc: Temporal  Temporal  SpO2: 98%  94%    General: Alert, awake, afebrile, anicteric, not in obvious distress HEENT: Normocephalic and Atraumatic, Mucous membranes pink                PERRLA; EOM intact; No scleral icterus,                 Nares: Patent, Oropharynx: Clear, Fair Dentition                 Neck: FROM, no cervical lymphadenopathy, thyromegaly, carotid bruit or JVD;  CHEST WALL: No tenderness  CHEST: Normal respiration, clear to auscultation bilaterally  HEART: Regular rate and rhythm; no murmurs rubs or gallops  BACK: No kyphosis or scoliosis; no CVA tenderness  ABDOMEN: Positive Bowel Sounds, soft, non-tender; no masses, no organomegaly EXTREMITIES: No cyanosis, clubbing, or edema SKIN:  no rash or ulceration  CNS: Alert and Oriented x 4, Nonfocal exam, CN 2-12 intact  Labs on Admission:  Basic Metabolic Panel: No results for input(s): "NA", "K", "CL", "CO2", "GLUCOSE", "BUN", "CREATININE", "CALCIUM", "MG", "PHOS" in the last 168 hours. Liver Function Tests: No results for input(s): "AST", "ALT", "ALKPHOS", "BILITOT", "PROT", "ALBUMIN" in the last 168 hours. No results for input(s): "LIPASE", "AMYLASE" in the last 168 hours. No results for input(s): "AMMONIA" in the last 168 hours. CBC: Recent Labs  Lab 07/13/23 1024  WBC 8.0  NEUTROABS 4.7  HGB 11.6*  HCT 34.4*  MCV 72.7*  PLT 282   Cardiac Enzymes: No results for input(s): "CKTOTAL", "CKMB", "CKMBINDEX", "TROPONINI" in the last 168 hours.  BNP (last 3 results) No results for input(s): "BNP" in the last 8760 hours.  ProBNP (last 3 results) No results for input(s): "PROBNP" in the last 8760 hours.  CBG: No results for input(s): "GLUCAP" in the last 168 hours.   Assessment/Plan Principal Problem:   Sickle cell anemia with pain (HCC)  Admits to the Day Hospital for  extended observation IVF D5 .45% Saline @ 125 mls/hour Weight based Dilaudid  PCA started within 30 minutes of admission IV Toradol  15 mg x 1 doses Acetaminophen  1000 mg x 1 dose Labs: CBCD, CMP, Retic Count and LDH Monitor vitals very closely, Re-evaluate pain scale every hour 2 L of Oxygen  by [place] Stephanie Burch will be re-evaluated for pain in the context of function and relationship to baseline as care progresses. If no significant relieve from pain (remains above 5/10) will transfer Stephanie Burch to inpatient services for further evaluation and management  Code Status: Full  Family Communication: None  DVT Prophylaxis: Ambulate as tolerated   Time spent: 35 Minutes  Lorel Roes NP  If 7PM-7AM, please contact night-coverage [email or web address] 07/13/2023, 1:58 PM

## 2023-07-13 NOTE — Progress Notes (Signed)
 Pt admitted to the day hospital today for sickle cell pain treatment. Pt reports 8/10 pain to bilateral legs. Pt received Dilaudid  PCA, IV Zofran , and hydrated with IV fluids via PIV. Pt also received PO Tylenol  and Benadryl . At discharge, pt rates pain 6/10. AVS offered, but pt declined. Pt is alert, oriented, and ambulatory at discharge.

## 2023-07-15 ENCOUNTER — Other Ambulatory Visit: Payer: Self-pay

## 2023-07-15 ENCOUNTER — Emergency Department (HOSPITAL_BASED_OUTPATIENT_CLINIC_OR_DEPARTMENT_OTHER)
Admission: EM | Admit: 2023-07-15 | Discharge: 2023-07-16 | Disposition: A | Discharge status: Home / Self Care | Attending: Emergency Medicine | Admitting: Emergency Medicine

## 2023-07-15 ENCOUNTER — Emergency Department (HOSPITAL_BASED_OUTPATIENT_CLINIC_OR_DEPARTMENT_OTHER)

## 2023-07-15 ENCOUNTER — Encounter (HOSPITAL_BASED_OUTPATIENT_CLINIC_OR_DEPARTMENT_OTHER): Payer: Self-pay

## 2023-07-15 DIAGNOSIS — M79604 Pain in right leg: Secondary | ICD-10-CM | POA: Diagnosis present

## 2023-07-15 DIAGNOSIS — D5700 Hb-SS disease with crisis, unspecified: Secondary | ICD-10-CM | POA: Insufficient documentation

## 2023-07-15 LAB — RETICULOCYTES
Immature Retic Fract: 33.5 % — ABNORMAL HIGH (ref 2.3–15.9)
RBC.: 4.82 MIL/uL (ref 3.87–5.11)
Retic Count, Absolute: 149.9 10*3/uL (ref 19.0–186.0)
Retic Ct Pct: 3.1 % (ref 0.4–3.1)

## 2023-07-15 LAB — CBC WITH DIFFERENTIAL/PLATELET
Abs Immature Granulocytes: 0.1 10*3/uL — ABNORMAL HIGH (ref 0.00–0.07)
Basophils Absolute: 0 10*3/uL (ref 0.0–0.1)
Basophils Relative: 0 %
Eosinophils Absolute: 0.1 10*3/uL (ref 0.0–0.5)
Eosinophils Relative: 2 %
HCT: 33 % — ABNORMAL LOW (ref 36.0–46.0)
Hemoglobin: 11.5 g/dL — ABNORMAL LOW (ref 12.0–15.0)
Immature Granulocytes: 1 %
Lymphocytes Relative: 29 %
Lymphs Abs: 2.4 10*3/uL (ref 0.7–4.0)
MCH: 24.2 pg — ABNORMAL LOW (ref 26.0–34.0)
MCHC: 34.8 g/dL (ref 30.0–36.0)
MCV: 69.3 fL — ABNORMAL LOW (ref 80.0–100.0)
Monocytes Absolute: 0.6 10*3/uL (ref 0.1–1.0)
Monocytes Relative: 8 %
Neutro Abs: 4.9 10*3/uL (ref 1.7–7.7)
Neutrophils Relative %: 60 %
Platelets: 260 10*3/uL (ref 150–400)
RBC: 4.76 MIL/uL (ref 3.87–5.11)
RDW: 14.6 % (ref 11.5–15.5)
WBC: 8.3 10*3/uL (ref 4.0–10.5)
nRBC: 0.2 % (ref 0.0–0.2)

## 2023-07-15 LAB — BASIC METABOLIC PANEL WITH GFR
Anion gap: 10 (ref 5–15)
BUN: 6 mg/dL (ref 6–20)
CO2: 25 mmol/L (ref 22–32)
Calcium: 9.5 mg/dL (ref 8.9–10.3)
Chloride: 108 mmol/L (ref 98–111)
Creatinine, Ser: 0.59 mg/dL (ref 0.44–1.00)
GFR, Estimated: >60 mL/min (ref >60)
Glucose, Bld: 88 mg/dL (ref 70–99)
Potassium: 3.9 mmol/L (ref 3.5–5.1)
Sodium: 143 mmol/L (ref 135–145)

## 2023-07-15 MED ORDER — HYDROMORPHONE HCL 1 MG/ML IJ SOLN
2.0000 mg | INTRAMUSCULAR | Status: AC
  Administered 2023-07-15: 2 mg via INTRAVENOUS
  Filled 2023-07-15: qty 2

## 2023-07-15 MED ORDER — DIPHENHYDRAMINE HCL 25 MG PO CAPS
25.0000 mg | ORAL_CAPSULE | ORAL | Status: DC | PRN
  Administered 2023-07-15: 25 mg via ORAL
  Filled 2023-07-15: qty 1

## 2023-07-15 MED ORDER — HYDROMORPHONE HCL 1 MG/ML IJ SOLN
2.0000 mg | Freq: Once | INTRAMUSCULAR | Status: AC
  Administered 2023-07-15: 2 mg via INTRAVENOUS
  Filled 2023-07-15: qty 2

## 2023-07-15 MED ORDER — ONDANSETRON HCL 4 MG/2ML IJ SOLN
4.0000 mg | INTRAMUSCULAR | Status: DC | PRN
  Administered 2023-07-15: 4 mg via INTRAVENOUS
  Filled 2023-07-15: qty 2

## 2023-07-15 MED ORDER — SODIUM CHLORIDE 0.45 % IV SOLN: INTRAVENOUS | Status: DC

## 2023-07-15 MED ORDER — HYDROMORPHONE HCL 1 MG/ML IJ SOLN
2.0000 mg | INTRAMUSCULAR | Status: AC
  Administered 2023-07-16: 2 mg via INTRAVENOUS
  Filled 2023-07-15: qty 2

## 2023-07-15 NOTE — ED Triage Notes (Signed)
 Complaining of pain in the legs and left arm from sickle cell disease. Started yesterday and is getting worse.

## 2023-07-15 NOTE — ED Provider Notes (Addendum)
 [place] [facility] Provider Note   CSN: [id number] Arrival date & time: 07/15/23  2032     History  Chief Complaint  Patient presents with   Sickle Cell Pain Crisis    Stephanie Burch is a 43 y.o. female who presents to the emergency department with a chief complaint of pain in both legs and left arm consistent with previous sickle cell crisis episodes.  Patient states that her pain started yesterday and is getting worse.  Patient has past medical history significant of chronic pain syndrome, chronic continuous use of opioids, sickle cell crisis, current moderate major depressive disorder.   Sickle Cell Pain Crisis Associated symptoms: no chest pain, no fever, no nausea, no shortness of breath and no vomiting        Home Medications Prior to Admission medications   Medication Sig Start Date End Date Taking? Authorizing Provider  acetaminophen  (TYLENOL ) 500 MG tablet Take 1,000 mg by mouth every 6 (six) hours as needed for mild pain, moderate pain or headache.    [provider]  folic acid  (FOLVITE ) 1 MG tablet Take 1 mg by mouth daily.    [provider]  hydroxyurea  (HYDREA ) 500 MG capsule Take 1,500 mg by mouth daily. 02/05/23   [provider]  ibuprofen  (ADVIL ) 800 MG tablet Take 1 tablet (800 mg total) by mouth every 8 (eight) hours as needed. 12/22/22   Paseda, Folashade R, FNP  methadone  (DOLOPHINE ) 10 MG tablet Take 10 mg by mouth 3 (three) times daily. 03/26/23   [provider]  naloxone  (NARCAN ) nasal spray 4 mg/0.1 mL Use as needed for opiod overdose 03/04/22   Paseda, Folashade R, FNP  ondansetron  (ZOFRAN ) 4 MG tablet Take by mouth. 02/28/22   [provider]  oxycodone  (ROXICODONE ) 30 MG immediate release tablet Take 30 mg by mouth every 4 (four) hours.    [provider]  valACYclovir  (VALTREX ) 1000 MG tablet Take 1 tablet by mouth daily. 01/15/23   [provider]   Vitamin D , Ergocalciferol , (DRISDOL ) 1.25 MG (50000 UNIT) CAPS capsule Take 1 capsule (50,000 Units total) by mouth every 7 (seven) days. Patient not taking: Reported on 01/12/2023 03/04/22   Paseda, Folashade R, FNP      Allergies    Patient has no known allergies.    Review of Systems   Review of Systems  Constitutional:  Negative for chills and fever.  Respiratory:  Positive for chest tightness. Negative for shortness of breath.   Cardiovascular:  Negative for chest pain.  Gastrointestinal:  Negative for diarrhea, nausea and vomiting.  Musculoskeletal:        Pain of both legs and left arm distant with previous sickle cell crisis.  Skin:  Negative for rash and wound.  Neurological:  Negative for dizziness, weakness and light-headedness.    Physical Exam Updated Vital Signs BP 129/87 (BP Location: Right Arm)   Pulse 87   Temp 98.6 F (37 C) (Oral)   Resp 16   Ht 5' 5 (1.651 m)   Wt (!) 142.9 kg   LMP 07/01/2023 (Approximate)   SpO2 96%   BMI 52.42 kg/m  Physical Exam Vitals and nursing note reviewed.  Constitutional:      General: She is awake. She is not in acute distress.    Appearance: Normal appearance. She is not ill-appearing, toxic-appearing or diaphoretic.  HENT:     Head: Normocephalic and atraumatic.   Eyes:     Extraocular  Movements: Extraocular movements intact.    Cardiovascular:     Rate and Rhythm: Normal rate and regular rhythm.  Pulmonary:     Effort: Pulmonary effort is normal. No tachypnea or respiratory distress.     Breath sounds: Normal breath sounds. No wheezing, rhonchi or rales.  Chest:     Chest wall: No tenderness.   Musculoskeletal:        General: Normal range of motion.     Cervical back: Normal range of motion.     Comments: Pain with palpation and movement of both lower extremities and L arm    Skin:    General: Skin is warm.     Capillary Refill: Capillary refill takes less than 2 seconds.   Neurological:     General:  No focal deficit present.     Mental Status: She is alert and oriented to person, place, and time.     Motor: No weakness.   Psychiatric:        Mood and Affect: Mood normal.        Behavior: Behavior normal. Behavior is cooperative.     ED Results / Procedures / Treatments   Labs (all labs ordered are listed, but only abnormal results are displayed) Labs Reviewed  CBC WITH DIFFERENTIAL/PLATELET - Abnormal; Notable for the following components:      Result Value   Hemoglobin 11.5 (*)    HCT 33.0 (*)    MCV 69.3 (*)    MCH 24.2 (*)    Abs Immature Granulocytes 0.10 (*)    All other components within normal limits  RETICULOCYTES - Abnormal; Notable for the following components:   Immature Retic Fract 33.5 (*)    All other components within normal limits  BASIC METABOLIC PANEL WITH GFR    EKG None  Radiology DG Chest Port 1 View Result Date: 07/15/2023 CLINICAL DATA:  Sickle cell crisis pain EXAM: PORTABLE CHEST 1 VIEW COMPARISON:  01/31/2022 FINDINGS: Stable cardiomegaly. Prominent pulmonary arteries. Bibasilar atelectasis or infiltrates. No pleural effusion or pneumothorax. No displaced rib fractures. IMPRESSION: Bibasilar atelectasis or infiltrates. Electronically Signed   By: Rozell Cornet M.D.   On: 07/15/2023 21:34    Procedures Procedures    Medications Ordered in ED Medications  HYDROmorphone  (DILAUDID ) injection 2 mg (2 mg Intravenous Given 07/15/23 2202)  HYDROmorphone  (DILAUDID ) injection 2 mg (2 mg Intravenous Given 07/15/23 2317)  HYDROmorphone  (DILAUDID ) injection 2 mg (2 mg Intravenous Given 07/16/23 0004)  oxyCODONE  (Oxy IR/ROXICODONE ) immediate release tablet 30 mg (30 mg Oral Given 07/16/23 0110)    ED Course/ Medical Decision Making/ A&P    Patient presents to the ED for concern of sickle cell pain crisis, this involves an extensive number of treatment options, and is a complaint that carries with it a high risk of complications and morbidity.  The  differential diagnosis includes sickle cell pain crisis, vaso-occlusive crisis, acute chest syndrome, pneumonia, pulmonary embolism, ACS, stroke, etc.   Co morbidities that complicate the patient evaluation  History of chronic opioid use and sickle cell   Lab Tests:  I Ordered, and personally interpreted labs.  The pertinent results include: BMP-unremarkable, CBC-hemoglobin 11.5, reticulocytes-immature reticulocytes 33.5   Imaging Studies ordered:  I ordered imaging studies including chest x-ray I independently visualized and interpreted imaging which showed bibasilar atelectasis or infiltrates I agree with the radiologist interpretation, however infiltrates does not fit my patient clinically.  Patient denies fever, chills, shortness of breath or other pneumonia/infectious-like symptoms.  Patient continually states  that this seems consistent with her previous sickle cell episodes.  Plan to treat presumptive sickle cell pain crisis and then reevaluate patient after symptoms improve.  Per chart review looks like patient has had multiple checks x-rays previously which also show mild atelectasis   Cardiac Monitoring:  The patient was maintained on a cardiac monitor.  I personally viewed and interpreted the cardiac monitored which showed an underlying rhythm of: sinus   Medicines ordered and prescription drug management:  I ordered medication including Dilaudid , fluids, oxycodone , Zofran , Benadryl  for sickle cell pain crisis Reevaluation of the patient after these medicines showed that the patient improved I have reviewed the patients home medicines and have made adjustments as needed   Test Considered:  none   Critical Interventions:  Treatment of sickle cell pain crisis   Problem List / ED Course:  43 year old female patient with past history of sickle cell on chronic opioids presents with sickle cell crisis Pain consistent with previous sickle cell crisis episodes 3  rounds of IV Dilaudid  given with relief, fluids, Benadryl , Zofran  given as well Patient given oral dose of at home pain medication prior to discharge Patient states she feels much better and feels ready for discharge Patient denies chest pain, shortness of breath prior to discharge, doubt acute chest syndrome Chest x-ray was read as bibasilar infiltrates or atelectasis, clinically this does not fit patient picture at this time, no fever, no chills, no shortness of breath, no abnormal lung sounds, patient educated about these findings and how they fit clinically Patient agrees that she believes this is a sickle cell crisis, she has improved with pain relief and IV fluids Patient feels she is ready for discharge Patient understands that although on her physical exam today, she is not experiencing shortness of breath and no abnormal lung sounds were obvious, she should return to the emergency department if she experiences shortness of breath or complications breathing. Return precautions given Patient discharged   Reevaluation:  After the interventions noted above, I reevaluated the patient and found that they have :improved   Social Determinants of Health:  none   Dispostion:  After consideration of the diagnostic results and the patients response to treatment, I feel that the patent would benefit from discharge and continued outpatient therapy for sickle cell crisis.  Recommend follow-up with primary care provider within 48 hours of discharge for ongoing diagnosis and treatment.  Return precautions given.  Patient discharged.  Click here for ABCD2, HEART and other calculatorsREFRESH Note before signing :1}                              Medical Decision Making Amount and/or Complexity of Data Reviewed Labs: ordered. Radiology: ordered.  Risk Prescription drug management.          Final Clinical Impression(s) / ED Diagnoses Final diagnoses:  Sickle cell pain crisis [facility])     Rx / DC Orders ED Discharge Orders     None         Fonda Hymen, PA-C 07/16/23 0057    Fonda Hymen, PA-C 07/16/23 1104    Scarlette Currier, MD 07/17/23 2324

## 2023-07-15 NOTE — ED Notes (Signed)
 Pain in legs and L arm per Pt.

## 2023-07-16 DIAGNOSIS — D5700 Hb-SS disease with crisis, unspecified: Secondary | ICD-10-CM | POA: Diagnosis not present

## 2023-07-16 MED ORDER — OXYCODONE HCL 5 MG PO TABS
30.0000 mg | ORAL_TABLET | Freq: Once | ORAL | Status: AC
  Administered 2023-07-16: 30 mg via ORAL
  Filled 2023-07-16: qty 6

## 2023-07-16 NOTE — Discharge Instructions (Addendum)
 It was a pleasure taking care of you today.  Today I cared for you during your sickle cell pain crisis.  This was consistent with previous sickle cell pain crisis's that you experience.  You were treated with IV pain medication as well as an oral dose of pain medication prior to discharge from the emergency department.  Please follow-up with your primary care provider as well as specialist for a post-discharge visit.  Please follow-up within 48 hours with your primary care provider.  Please return the emergency department or seek further medical care if you experiencing the following symptoms including but not limited to severe chest pain, shortness of breath, severe nausea/vomiting, severe abdominal pain, symptoms consistent with your sickle cell pain crisis that are severe, dizziness, weakness.

## 2023-07-29 ENCOUNTER — Non-Acute Institutional Stay (HOSPITAL_COMMUNITY)
Admission: AD | Admit: 2023-07-29 | Discharge: 2023-07-29 | Disposition: A | Discharge status: Home / Self Care | Source: Ambulatory Visit | Attending: Internal Medicine | Admitting: Internal Medicine

## 2023-07-29 ENCOUNTER — Telehealth (HOSPITAL_COMMUNITY): Payer: Self-pay

## 2023-07-29 DIAGNOSIS — E669 Obesity, unspecified: Secondary | ICD-10-CM | POA: Diagnosis not present

## 2023-07-29 DIAGNOSIS — F1120 Opioid dependence, uncomplicated: Secondary | ICD-10-CM | POA: Insufficient documentation

## 2023-07-29 DIAGNOSIS — G894 Chronic pain syndrome: Secondary | ICD-10-CM | POA: Diagnosis not present

## 2023-07-29 DIAGNOSIS — D638 Anemia in other chronic diseases classified elsewhere: Secondary | ICD-10-CM | POA: Insufficient documentation

## 2023-07-29 DIAGNOSIS — D5700 Hb-SS disease with crisis, unspecified: Secondary | ICD-10-CM | POA: Insufficient documentation

## 2023-07-29 LAB — CBC WITH DIFFERENTIAL/PLATELET
Abs Immature Granulocytes: 0.02 10*3/uL (ref 0.00–0.07)
Basophils Absolute: 0 10*3/uL (ref 0.0–0.1)
Basophils Relative: 0 %
Eosinophils Absolute: 0.3 10*3/uL (ref 0.0–0.5)
Eosinophils Relative: 3 %
HCT: 33.7 % — ABNORMAL LOW (ref 36.0–46.0)
Hemoglobin: 11.3 g/dL — ABNORMAL LOW (ref 12.0–15.0)
Immature Granulocytes: 0 %
Lymphocytes Relative: 25 %
Lymphs Abs: 2.5 10*3/uL (ref 0.7–4.0)
MCH: 24.6 pg — ABNORMAL LOW (ref 26.0–34.0)
MCHC: 33.5 g/dL (ref 30.0–36.0)
MCV: 73.3 fL — ABNORMAL LOW (ref 80.0–100.0)
Monocytes Absolute: 0.9 10*3/uL (ref 0.1–1.0)
Monocytes Relative: 9 %
Neutro Abs: 6.2 10*3/uL (ref 1.7–7.7)
Neutrophils Relative %: 63 %
Platelets: 243 10*3/uL (ref 150–400)
RBC: 4.6 MIL/uL (ref 3.87–5.11)
RDW: 16.4 % — ABNORMAL HIGH (ref 11.5–15.5)
WBC: 10 10*3/uL (ref 4.0–10.5)
nRBC: 0.6 % — ABNORMAL HIGH (ref 0.0–0.2)

## 2023-07-29 LAB — RETICULOCYTES
Immature Retic Fract: 30.6 % — ABNORMAL HIGH (ref 2.3–15.9)
RBC.: 4.55 MIL/uL (ref 3.87–5.11)
Retic Count, Absolute: 229.5 10*3/uL — ABNORMAL HIGH (ref 19.0–186.0)
Retic Ct Pct: 5 % — ABNORMAL HIGH (ref 0.4–3.1)

## 2023-07-29 MED ORDER — NALOXONE HCL 0.4 MG/ML IJ SOLN: 0.4000 mg | INTRAMUSCULAR | Status: DC | PRN

## 2023-07-29 MED ORDER — KETOROLAC TROMETHAMINE 15 MG/ML IJ SOLN
15.0000 mg | Freq: Four times a day (QID) | INTRAMUSCULAR | Status: DC
  Administered 2023-07-29: 15 mg via INTRAVENOUS
  Filled 2023-07-29: qty 1

## 2023-07-29 MED ORDER — POLYETHYLENE GLYCOL 3350 17 G PO PACK: 17.0000 g | PACK | Freq: Every day | ORAL | Status: DC | PRN

## 2023-07-29 MED ORDER — SENNOSIDES-DOCUSATE SODIUM 8.6-50 MG PO TABS
1.0000 | ORAL_TABLET | Freq: Two times a day (BID) | ORAL | Status: DC
  Filled 2023-07-29: qty 1

## 2023-07-29 MED ORDER — ONDANSETRON HCL 4 MG/2ML IJ SOLN
4.0000 mg | Freq: Four times a day (QID) | INTRAMUSCULAR | Status: DC | PRN
  Administered 2023-07-29: 4 mg via INTRAVENOUS
  Filled 2023-07-29: qty 2

## 2023-07-29 MED ORDER — HYDROMORPHONE 1 MG/ML IV SOLN
INTRAVENOUS | Status: DC
  Administered 2023-07-29: 13.5 mg via INTRAVENOUS
  Administered 2023-07-29: 30 mg via INTRAVENOUS
  Filled 2023-07-29: qty 30

## 2023-07-29 MED ORDER — ACETAMINOPHEN 500 MG PO TABS
1000.0000 mg | ORAL_TABLET | Freq: Once | ORAL | Status: AC
  Administered 2023-07-29: 1000 mg via ORAL
  Filled 2023-07-29: qty 2

## 2023-07-29 MED ORDER — SODIUM CHLORIDE 0.45 % IV SOLN: INTRAVENOUS | Status: DC

## 2023-07-29 MED ORDER — DIPHENHYDRAMINE HCL 25 MG PO CAPS
25.0000 mg | ORAL_CAPSULE | ORAL | Status: DC | PRN
  Filled 2023-07-29: qty 1

## 2023-07-29 MED ORDER — SODIUM CHLORIDE 0.9% FLUSH: 9.0000 mL | INTRAVENOUS | Status: DC | PRN

## 2023-07-29 NOTE — Discharge Summary (Signed)
 Physician Discharge Summary  Stephanie Burch FMW:[id number] DOB: 1980/08/02 DOA: 07/29/2023  PCP: Patient, No Pcp Per  Admit date: 07/29/2023  Discharge date: 07/29/2023  Time spent: 30 minutes  Discharge Diagnoses:  Principal Problem:   Sickle-cell disease with pain [facility])   Discharge Condition: Stable  Diet recommendation: Regular  History of present illness:  Stephanie Burch is a 43 y.o. female with history of sickle cell disease, chronic pain syndrome, opiate dependence and tolerance, obesity, and anemia of chronic disease presents with complaints of generalized pain, that is consistent with her typical pain crisis. Patient says that pain intensity has been elevated over the past few days and unrelieved by her home medications. Pain is worse in left arm. With no precipitating factor.  Patient last had oxycodone  this am without sustained relief. Patient rates pain as 8/10, constant and throbbing. Patient denies fever, chills, chest pain, shortness of breath, nausea, vomiting, or diarrhea. No sick contacts or recent travel.   Hospital Course:  Stephanie Burch was admitted to the day hospital with sickle cell painful crisis. Patient was treated with IV fluid, weight based IV Dilaudid  PCA, IV Toradol , clinician assisted doses as deemed appropriate, and other adjunct therapies per sickle cell pain management protocol. Stephanie Burch showed significant improvement symptomatically, pain improved from 8/10 to 6/10 at the time of discharge. Patient was discharged home in a hemodynamically stable condition. Stephanie Burch will follow-up at the clinic as previously scheduled, continue with home medications as per prior to admission.  Discharge Instructions We discussed the need for good hydration, monitoring of hydration status, avoidance of heat, cold, stress, and infection triggers. We discussed the need to be compliant with taking Hydrea  and other home medications. Stephanie Burch was reminded of the need to seek  medical attention immediately if any symptom of bleeding, anemia, or infection occurs.  Discharge Exam: Vitals:   07/29/23 1104 07/29/23 1329  BP:  127/72  Pulse:  93  Resp: 16 13  Temp:  97.7 F (36.5 C)  SpO2:  95%    General appearance: alert, cooperative and no distress Eyes: conjunctivae/corneas clear. PERRL, EOM's intact. Fundi benign. Neck: no adenopathy, no carotid bruit, no JVD, supple, symmetrical, trachea midline and thyroid  not enlarged, symmetric, no tenderness/mass/nodules Back: symmetric, no curvature. ROM normal. No CVA tenderness. Resp: clear to auscultation bilaterally Chest wall: no tenderness Cardio: regular rate and rhythm, S1, S2 normal, no murmur, click, rub or gallop GI: soft, non-tender; bowel sounds normal; no masses,  no organomegaly Extremities: extremities normal, atraumatic, no cyanosis or edema Pulses: 2+ and symmetric Skin: Skin color, texture, turgor normal. No rashes or lesions Neurologic: Grossly normal  Discharge Instructions     Call MD for:  severe uncontrolled pain   Complete by: As directed    Call MD for:  temperature >100.4   Complete by: As directed    Diet - low sodium heart healthy   Complete by: As directed    Increase activity slowly   Complete by: As directed       Allergies as of 07/29/2023   No Known Allergies      Medication List     TAKE these medications    acetaminophen  500 MG tablet Commonly known as: TYLENOL  Take 1,000 mg by mouth every 6 (six) hours as needed for mild pain, moderate pain or headache.   folic acid  1 MG tablet Commonly known as: FOLVITE  Take 1 mg by mouth daily.   hydroxyurea  500 MG capsule Commonly known as: HYDREA  Take  1,500 mg by mouth daily.   ibuprofen  800 MG tablet Commonly known as: ADVIL  Take 1 tablet (800 mg total) by mouth every 8 (eight) hours as needed.   methadone  10 MG tablet Commonly known as: DOLOPHINE  Take 10 mg by mouth 3 (three) times daily.   naloxone  4  MG/0.1ML Liqd nasal spray kit Commonly known as: NARCAN  Use as needed for opiod overdose   ondansetron  4 MG tablet Commonly known as: ZOFRAN  Take by mouth.   oxycodone  30 MG immediate release tablet Commonly known as: ROXICODONE  Take 30 mg by mouth every 4 (four) hours.   valACYclovir  1000 MG tablet Commonly known as: VALTREX  Take 1 tablet by mouth daily.   Vitamin D  (Ergocalciferol ) 1.25 MG (50000 UNIT) Caps capsule Commonly known as: DRISDOL  Take 1 capsule (50,000 Units total) by mouth every 7 (seven) days.       No Known Allergies   Significant Diagnostic Studies: DG Chest Port 1 View Result Date: 07/15/2023 CLINICAL DATA:  Sickle cell crisis pain EXAM: PORTABLE CHEST 1 VIEW COMPARISON:  01/31/2022 FINDINGS: Stable cardiomegaly. Prominent pulmonary arteries. Bibasilar atelectasis or infiltrates. No pleural effusion or pneumothorax. No displaced rib fractures. IMPRESSION: Bibasilar atelectasis or infiltrates. Electronically Signed   By: Norman Gatlin M.D.   On: 07/15/2023 21:34    Signed:  Homer CHRISTELLA Cover NP  07/29/2023, 2:33 PM

## 2023-07-29 NOTE — H&P (Addendum)
 [facility] History and Physical  Stephanie Burch FMW:[id number] DOB: September 13, 1980 DOA: 07/29/2023  PCP: Patient, No Pcp Per   Chief Complaint:  Chief Complaint  Patient presents with   Sickle Cell Pain Crisis    HPI: Stephanie Burch is a 43 y.o. female with history of sickle cell disease, chronic pain syndrome, opiate dependence and tolerance, obesity, and anemia of chronic disease presents with complaints of generalized pain, that is consistent with her typical pain crisis. Patient says that pain intensity has been elevated over the past few days and unrelieved by her home medications. Pain is worse in left arm. With no precipitating factor.  Patient last had oxycodone  this am without sustained relief. Patient rates pain as 8/10, constant and throbbing. Patient denies fever, chills, chest pain, shortness of breath, nausea, vomiting, or diarrhea. No sick contacts or recent travel.  Systemic Review: General: The patient denies anorexia, fever, weight loss Cardiac: Denies chest pain, syncope, palpitations, pedal edema  Respiratory: Denies cough, shortness of breath, wheezing GI: Denies severe indigestion/heartburn, abdominal pain, nausea, vomiting, diarrhea and constipation GU: Denies hematuria, incontinence, dysuria  Musculoskeletal: left arm tenderness Skin: Denies suspicious skin lesions Neurologic: Denies focal weakness or numbness, change in vision  Past Medical History:  Diagnosis Date   Acute kidney injury (HCC) 11/07/2019   Anemia    Chronic pain    COVID-19 virus detected 10/22/2019   Depression    HCAP (healthcare-associated pneumonia) 11/07/2019   History of blood transfusion 2013   x 2   HSV infection    Hypoxia    Pneumonia    x 1   Pulmonary edema 11/11/2019   Sepsis (HCC) 11/07/2019   Sickle cell anemia (HCC) 09/2021   gets them frequently   Tachycardia with heart rate 100-120 beats per minute    Vitamin B12 deficiency 12/2018   Vitamin D  deficiency      Past Surgical History:  Procedure Laterality Date   CESAREAN SECTION  2013   x 1   TOOTH EXTRACTION N/A 10/18/2021   Procedure: DENTAL RESTORATION/EXTRACTIONS;  Surgeon: Sheryle Hamilton, DMD;  Location: [facility] OR;  Service: Oral Surgery;  Laterality: N/A;    No Known Allergies  Family History  Problem Relation Age of Onset   Hypertension Mother    Sickle cell trait Mother    Diabetes Father    Sickle cell trait Father       Prior to Admission medications   Medication Sig Start Date End Date Taking? Authorizing Provider  ibuprofen  (ADVIL ) 800 MG tablet Take 1 tablet (800 mg total) by mouth every 8 (eight) hours as needed. 12/22/22  Yes Paseda, Folashade R, FNP  methadone  (DOLOPHINE ) 10 MG tablet Take 10 mg by mouth 3 (three) times daily. 03/26/23  Yes [provider]  oxycodone  (ROXICODONE ) 30 MG immediate release tablet Take 30 mg by mouth every 4 (four) hours.   Yes [provider]  valACYclovir  (VALTREX ) 1000 MG tablet Take 1 tablet by mouth daily. 01/15/23  Yes [provider]  acetaminophen  (TYLENOL ) 500 MG tablet Take 1,000 mg by mouth every 6 (six) hours as needed for mild pain, moderate pain or headache.    [provider]  folic acid  (FOLVITE ) 1 MG tablet Take 1 mg by mouth daily.    [provider]  hydroxyurea  (HYDREA ) 500 MG capsule Take 1,500 mg by mouth daily. 02/05/23   [provider]  naloxone  (NARCAN ) nasal spray 4 mg/0.1 mL Use as needed for opiod  overdose 03/04/22   Paseda, Folashade R, FNP  ondansetron  (ZOFRAN ) 4 MG tablet Take by mouth. 02/28/22   [provider]  Vitamin D , Ergocalciferol , (DRISDOL ) 1.25 MG (50000 UNIT) CAPS capsule Take 1 capsule (50,000 Units total) by mouth every 7 (seven) days. Patient not taking: Reported on 01/12/2023 03/04/22   Paseda, Folashade R, FNP     Physical Exam: Vitals:   07/29/23 1054 07/29/23 1104 07/29/23 1329  BP: 136/84  127/72  Pulse: 100  93  Resp: 16 16 13    Temp: 98.4 F (36.9 C)  97.7 F (36.5 C)  TempSrc: Temporal  Temporal  SpO2: 97%  95%    General: Alert, awake, afebrile, anicteric, not in obvious distress HEENT: Normocephalic and Atraumatic, Mucous membranes pink                PERRLA; EOM intact; No scleral icterus,                 Nares: Patent, Oropharynx: Clear, Fair Dentition                 Neck: FROM, no cervical lymphadenopathy, thyromegaly, carotid bruit or JVD;  CHEST WALL: No tenderness  CHEST: Normal respiration, clear to auscultation bilaterally  HEART: Regular rate and rhythm; no murmurs rubs or gallops  BACK: No kyphosis or scoliosis; no CVA tenderness  ABDOMEN: Positive Bowel Sounds, soft, non-tender; no masses, no organomegaly EXTREMITIES: left arm tenderness SKIN:  no rash or ulceration  CNS: Alert and Oriented x 4, Nonfocal exam, CN 2-12 intact  Labs on Admission:  Basic Metabolic Panel: No results for input(s): NA, K, CL, CO2, GLUCOSE, BUN, CREATININE, CALCIUM, MG, PHOS in the last 168 hours. Liver Function Tests: No results for input(s): AST, ALT, ALKPHOS, BILITOT, PROT, ALBUMIN in the last 168 hours. No results for input(s): LIPASE, AMYLASE in the last 168 hours. No results for input(s): AMMONIA in the last 168 hours. CBC: Recent Labs  Lab 07/29/23 1045  WBC 10.0  NEUTROABS 6.2  HGB 11.3*  HCT 33.7*  MCV 73.3*  PLT 243   Cardiac Enzymes: No results for input(s): CKTOTAL, CKMB, CKMBINDEX, TROPONINI in the last 168 hours.  BNP (last 3 results) No results for input(s): BNP in the last 8760 hours.  ProBNP (last 3 results) No results for input(s): PROBNP in the last 8760 hours.  CBG: No results for input(s): GLUCAP in the last 168 hours.   Assessment/Plan Principal Problem:   Sickle-cell disease with pain (HCC)  Admits to the Day Hospital for extended observation IVF 0 .45% Saline @ 100 mls/hour Weight based Dilaudid  PCA started within  30 minutes of admission IV Toradol  15 mg x 1  doses Acetaminophen  1000 mg x 1 dose Labs: CBCD, CMP, Retic Count and LDH Monitor vitals very closely, Re-evaluate pain scale every hour 2 L of Oxygen  by [place] Patient will be re-evaluated for pain in the context of function and relationship to baseline as care progresses. If no significant relieve from pain (remains above 5/10) will transfer patient to inpatient services for further evaluation and management  Code Status: Full  Family Communication: None  DVT Prophylaxis: Ambulate as tolerated   Time spent: 35 Minutes  Homer CHRISTELLA Cover NP  If 7PM-7AM, please contact night-coverage [email or web address] 07/29/2023, 2:55 PM

## 2023-07-29 NOTE — Progress Notes (Signed)
 Pt admitted to the day hospital by Homer Cover FNP for treatment of sickle cell pain crisis. Pt reported pain to bilateral legs and L arm rated a 8/10. Pt given PO Tylenol , IV zofran  and toradol ,placed on Dilaudid  PCA 0.5/10/3, and hydrated with 1/2 NS IV  fluids. Pt reports pain to L arm has improved slightly rates a 6/10, per provider pt is to go to the ED for imaging if pain worsens, pt states she does not want to go today but will go to ED tomorrow if issue is not resolved. Provider notified. At discharge patient reported pain a 6/10. Pt declined printed AVS. Pt alert , oriented and ambulatory at discharge.

## 2023-07-29 NOTE — Telephone Encounter (Signed)
 Pt is admitted to the day hospital today for sickle cell pain treatment. Pt reports 8/10 pain to legs and left arm. Pt reports that she took Oxycodone  30 mg and Methadone  at 5 AM. Pt denies being to the ER recently. Pt denies fever, chest pain, N/V/D, abdominal pain. Homer Cover, NP notified. Per provider pt can come to the day hospital today. Pt notified and verbalized understanding. Per pt her son will be her transportation today.

## 2023-08-03 ENCOUNTER — Telehealth (HOSPITAL_COMMUNITY): Payer: Self-pay

## 2023-08-03 NOTE — Telephone Encounter (Signed)
 Pt called the day hospital wanting to come in today for sickle cell pain treatment. Pt reports 7/10 pain to legs. Pt reports taking Oxycodone  30 mg at 5 AM. Pt denies being to the ER recently. Pt was treated in the day hospital last Wednesday. Pt screened for COVID and denies symptoms and exposures. Pt denies fever, chest pain, N/V/D, abdominal pain. Homer Cover, NP notified and per provider pt is unable to come to the day hospital today. Per provider pt should manage at home today with home medication. Pt notified and verbalized understanding.

## 2023-08-04 ENCOUNTER — Inpatient Hospital Stay (HOSPITAL_COMMUNITY)
Admission: AD | Admit: 2023-08-04 | Discharge: 2023-08-06 | DRG: 812 | Disposition: A | Discharge status: Home / Self Care | Source: Ambulatory Visit | Attending: Internal Medicine | Admitting: Internal Medicine

## 2023-08-04 ENCOUNTER — Other Ambulatory Visit: Payer: Self-pay

## 2023-08-04 ENCOUNTER — Telehealth (HOSPITAL_COMMUNITY): Payer: Self-pay

## 2023-08-04 ENCOUNTER — Encounter (HOSPITAL_COMMUNITY): Payer: Self-pay | Admitting: Internal Medicine

## 2023-08-04 DIAGNOSIS — D638 Anemia in other chronic diseases classified elsewhere: Secondary | ICD-10-CM | POA: Diagnosis present

## 2023-08-04 DIAGNOSIS — D72829 Elevated white blood cell count, unspecified: Secondary | ICD-10-CM | POA: Diagnosis present

## 2023-08-04 DIAGNOSIS — D5700 Hb-SS disease with crisis, unspecified: Secondary | ICD-10-CM | POA: Diagnosis present

## 2023-08-04 DIAGNOSIS — F1120 Opioid dependence, uncomplicated: Secondary | ICD-10-CM | POA: Diagnosis present

## 2023-08-04 DIAGNOSIS — F321 Major depressive disorder, single episode, moderate: Secondary | ICD-10-CM | POA: Diagnosis present

## 2023-08-04 DIAGNOSIS — Z6841 Body Mass Index (BMI) 40.0 and over, adult: Secondary | ICD-10-CM

## 2023-08-04 DIAGNOSIS — G894 Chronic pain syndrome: Secondary | ICD-10-CM | POA: Diagnosis present

## 2023-08-04 LAB — COMPREHENSIVE METABOLIC PANEL WITH GFR
ALT: 15 U/L (ref 0–44)
AST: 18 U/L (ref 15–41)
Albumin: 3.6 g/dL (ref 3.5–5.0)
Alkaline Phosphatase: 78 U/L (ref 38–126)
Anion gap: 9 (ref 5–15)
BUN: 6 mg/dL (ref 6–20)
CO2: 24 mmol/L (ref 22–32)
Calcium: 9.1 mg/dL (ref 8.9–10.3)
Chloride: 105 mmol/L (ref 98–111)
Creatinine, Ser: 0.72 mg/dL (ref 0.44–1.00)
GFR, Estimated: >60 mL/min (ref >60)
Glucose, Bld: 113 mg/dL — ABNORMAL HIGH (ref 70–99)
Potassium: 4.1 mmol/L (ref 3.5–5.1)
Sodium: 138 mmol/L (ref 135–145)
Total Bilirubin: 0.9 mg/dL (ref 0.0–1.2)
Total Protein: 7.5 g/dL (ref 6.5–8.1)

## 2023-08-04 LAB — RETICULOCYTES
Immature Retic Fract: 24 % — ABNORMAL HIGH (ref 2.3–15.9)
RBC.: 4.61 MIL/uL (ref 3.87–5.11)
Retic Count, Absolute: 166.9 10*3/uL (ref 19.0–186.0)
Retic Ct Pct: 3.6 % — ABNORMAL HIGH (ref 0.4–3.1)

## 2023-08-04 LAB — CBC WITH DIFFERENTIAL/PLATELET
Abs Immature Granulocytes: 0.02 10*3/uL (ref 0.00–0.07)
Basophils Absolute: 0 10*3/uL (ref 0.0–0.1)
Basophils Relative: 0 %
Eosinophils Absolute: 0.2 10*3/uL (ref 0.0–0.5)
Eosinophils Relative: 3 %
HCT: 33 % — ABNORMAL LOW (ref 36.0–46.0)
Hemoglobin: 11.3 g/dL — ABNORMAL LOW (ref 12.0–15.0)
Immature Granulocytes: 0 %
Lymphocytes Relative: 28 %
Lymphs Abs: 2.2 10*3/uL (ref 0.7–4.0)
MCH: 24.4 pg — ABNORMAL LOW (ref 26.0–34.0)
MCHC: 34.2 g/dL (ref 30.0–36.0)
MCV: 71.3 fL — ABNORMAL LOW (ref 80.0–100.0)
Monocytes Absolute: 0.7 10*3/uL (ref 0.1–1.0)
Monocytes Relative: 9 %
Neutro Abs: 4.6 10*3/uL (ref 1.7–7.7)
Neutrophils Relative %: 60 %
Platelets: 281 10*3/uL (ref 150–400)
RBC: 4.63 MIL/uL (ref 3.87–5.11)
RDW: 15 % (ref 11.5–15.5)
WBC: 7.8 10*3/uL (ref 4.0–10.5)
nRBC: 0 % (ref 0.0–0.2)

## 2023-08-04 LAB — PREGNANCY, URINE: Preg Test, Ur: NEGATIVE

## 2023-08-04 LAB — LACTATE DEHYDROGENASE: LDH: 145 U/L (ref 98–192)

## 2023-08-04 MED ORDER — DIPHENHYDRAMINE HCL 25 MG PO CAPS
25.0000 mg | ORAL_CAPSULE | ORAL | Status: DC | PRN
  Filled 2023-08-04: qty 1

## 2023-08-04 MED ORDER — SENNOSIDES-DOCUSATE SODIUM 8.6-50 MG PO TABS: 1.0000 | ORAL_TABLET | Freq: Two times a day (BID) | ORAL | Status: DC

## 2023-08-04 MED ORDER — DIPHENHYDRAMINE HCL 25 MG PO CAPS: 25.0000 mg | ORAL_CAPSULE | ORAL | Status: DC | PRN

## 2023-08-04 MED ORDER — HYDROMORPHONE 1 MG/ML IV SOLN
INTRAVENOUS | Status: DC
  Administered 2023-08-04: 13 mg via INTRAVENOUS
  Administered 2023-08-04: 8.5 mg via INTRAVENOUS
  Administered 2023-08-04: 30 mg via INTRAVENOUS
  Administered 2023-08-05: 11 mg via INTRAVENOUS
  Administered 2023-08-05: 10.5 mg via INTRAVENOUS
  Administered 2023-08-05: 11.5 mg via INTRAVENOUS
  Administered 2023-08-05: 30 mg via INTRAVENOUS
  Administered 2023-08-05: 8 mg via INTRAVENOUS
  Administered 2023-08-05: 30 mg via INTRAVENOUS
  Administered 2023-08-05: 12 mg via INTRAVENOUS
  Administered 2023-08-05: 9 mg via INTRAVENOUS
  Administered 2023-08-06: 10.5 mg via INTRAVENOUS
  Administered 2023-08-06: 30 mg via INTRAVENOUS
  Administered 2023-08-06: 7 mg via INTRAVENOUS
  Administered 2023-08-06: 3 mg via INTRAVENOUS
  Filled 2023-08-04 (×4): qty 30

## 2023-08-04 MED ORDER — SODIUM CHLORIDE 0.9% FLUSH: 9.0000 mL | INTRAVENOUS | Status: DC | PRN

## 2023-08-04 MED ORDER — ENOXAPARIN SODIUM 60 MG/0.6ML IJ SOSY
60.0000 mg | PREFILLED_SYRINGE | INTRAMUSCULAR | Status: DC
  Administered 2023-08-04 – 2023-08-05 (×2): 60 mg via SUBCUTANEOUS
  Filled 2023-08-04 (×2): qty 0.6

## 2023-08-04 MED ORDER — SODIUM CHLORIDE 0.45 % IV SOLN: INTRAVENOUS | Status: AC

## 2023-08-04 MED ORDER — KETOROLAC TROMETHAMINE 15 MG/ML IJ SOLN
15.0000 mg | Freq: Four times a day (QID) | INTRAMUSCULAR | Status: DC
  Administered 2023-08-04 – 2023-08-06 (×8): 15 mg via INTRAVENOUS
  Filled 2023-08-04 (×8): qty 1

## 2023-08-04 MED ORDER — OXYCODONE HCL 5 MG PO TABS
30.0000 mg | ORAL_TABLET | ORAL | Status: DC
  Administered 2023-08-04 – 2023-08-06 (×12): 30 mg via ORAL
  Filled 2023-08-04 (×12): qty 6

## 2023-08-04 MED ORDER — HYDROMORPHONE 1 MG/ML IV SOLN
INTRAVENOUS | Status: DC
  Administered 2023-08-04: 30 mg via INTRAVENOUS
  Filled 2023-08-04: qty 30

## 2023-08-04 MED ORDER — ONDANSETRON HCL 4 MG/2ML IJ SOLN: 4.0000 mg | Freq: Four times a day (QID) | INTRAMUSCULAR | Status: DC | PRN

## 2023-08-04 MED ORDER — METHADONE HCL 10 MG PO TABS
10.0000 mg | ORAL_TABLET | Freq: Three times a day (TID) | ORAL | Status: DC
  Administered 2023-08-04 – 2023-08-06 (×6): 10 mg via ORAL
  Filled 2023-08-04 (×6): qty 1

## 2023-08-04 MED ORDER — ACETAMINOPHEN 500 MG PO TABS
1000.0000 mg | ORAL_TABLET | Freq: Once | ORAL | Status: AC
  Administered 2023-08-04: 1000 mg via ORAL
  Filled 2023-08-04: qty 2

## 2023-08-04 MED ORDER — POLYETHYLENE GLYCOL 3350 17 G PO PACK: 17.0000 g | PACK | Freq: Every day | ORAL | Status: DC | PRN

## 2023-08-04 MED ORDER — KETOROLAC TROMETHAMINE 15 MG/ML IJ SOLN
15.0000 mg | Freq: Four times a day (QID) | INTRAMUSCULAR | Status: DC
  Administered 2023-08-04: 15 mg via INTRAVENOUS
  Filled 2023-08-04: qty 1

## 2023-08-04 MED ORDER — ENOXAPARIN SODIUM 40 MG/0.4ML IJ SOSY: 40.0000 mg | PREFILLED_SYRINGE | INTRAMUSCULAR | Status: DC

## 2023-08-04 MED ORDER — ACETAMINOPHEN 500 MG PO TABS: 1000.0000 mg | ORAL_TABLET | Freq: Four times a day (QID) | ORAL | Status: DC | PRN

## 2023-08-04 MED ORDER — NALOXONE HCL 0.4 MG/ML IJ SOLN: 0.4000 mg | INTRAMUSCULAR | Status: DC | PRN

## 2023-08-04 MED ORDER — SENNOSIDES-DOCUSATE SODIUM 8.6-50 MG PO TABS
1.0000 | ORAL_TABLET | Freq: Two times a day (BID) | ORAL | Status: DC
  Administered 2023-08-06: 1 via ORAL
  Filled 2023-08-04 (×5): qty 1

## 2023-08-04 MED ORDER — SODIUM CHLORIDE 0.45 % IV SOLN: INTRAVENOUS | Status: DC

## 2023-08-04 NOTE — H&P (Signed)
 H&P  Patient Demographics:  Stephanie Burch, is a 43 y.o. female  MRN: [id number]   DOB - [birth year]  Admit Date - 08/04/2023  Outpatient Primary MD for the patient is Patient, No Pcp Per  Chief Complaint  Patient presents with   Sickle Cell Pain Crisis      HPI:   Stephanie Burch  is a 43 y.o. female with history of sickle cell disease, chronic pain syndrome, opiate dependence and tolerance, obesity, and anemia of chronic disease presents with complaints of generalized pain, that is consistent with her typical pain crisis. Patient says that pain intensity has been elevated over the past few days and unrelieved by her home medications. Pain is worse in bilateral extremities and back. Pain precipitated by change in weather.  Patient last had oxycodone  this AM without sustained relief. Patient rates pain as 8/10, constant and throbbing. Patient denies fever, chills, chest pain, shortness of breath, nausea, vomiting, or diarrhea. No sick contacts or recent travel.   Day hospital course: Patient treated with IV fluids, IV pain medication with no changes or resolution to pain symptoms.  She is admitted in-patient for ongoing sickle cell pain management.   Review of systems:  In addition to the HPI above, patient reports No fever or chills No Headache, No changes with vision or hearing No problems swallowing food or liquids No chest pain, cough or shortness of breath No abdominal pain, No nausea or vomiting, Bowel movements are regular No blood in stool or urine No dysuria No new skin rashes or bruises No new weakness, tingling, numbness in any extremity No recent weight gain or loss No polyuria, polydypsia or polyphagia No significant Mental Stressors  A full 10 point Review of Systems was done, except as stated above, all other Review of Systems were negative.  With Past History of the following :   Past Medical History:  Diagnosis Date   Acute kidney injury (HCC) 11/07/2019   Anemia     Chronic pain    COVID-19 virus detected 10/22/2019   Depression    HCAP (healthcare-associated pneumonia) 11/07/2019   History of blood transfusion 2013   x 2   HSV infection    Hypoxia    Pneumonia    x 1   Pulmonary edema 11/11/2019   Sepsis (HCC) 11/07/2019   Sickle cell anemia (HCC) 09/2021   gets them frequently   Tachycardia with heart rate 100-120 beats per minute    Vitamin B12 deficiency 12/2018   Vitamin D  deficiency       Past Surgical History:  Procedure Laterality Date   CESAREAN SECTION  2013   x 1   TOOTH EXTRACTION N/A 10/18/2021   Procedure: DENTAL RESTORATION/EXTRACTIONS;  Surgeon: Sheryle Hamilton, DMD;  Location: [facility] OR;  Service: Oral Surgery;  Laterality: N/A;     Social History:   Social History   Tobacco Use   Smoking status: Never   Smokeless tobacco: Never  Substance Use Topics   Alcohol use: Not Currently     Lives - At home   Family History :   Family History  Problem Relation Age of Onset   Hypertension Mother    Sickle cell trait Mother    Diabetes Father    Sickle cell trait Father      Home Medications:   Prior to Admission medications   Medication Sig Start Date End Date Taking? Authorizing Provider  acetaminophen  (TYLENOL ) 500 MG tablet Take 1,000 mg by mouth every 6 (six) hours  as needed for mild pain, moderate pain or headache.   Yes [provider]  ibuprofen  (ADVIL ) 800 MG tablet Take 1 tablet (800 mg total) by mouth every 8 (eight) hours as needed. 12/22/22  Yes Paseda, Folashade R, FNP  methadone  (DOLOPHINE ) 10 MG tablet Take 10 mg by mouth 3 (three) times daily. 03/26/23  Yes [provider]  ondansetron  (ZOFRAN ) 4 MG tablet Take by mouth. 02/28/22  Yes [provider]  oxycodone  (ROXICODONE ) 30 MG immediate release tablet Take 30 mg by mouth every 4 (four) hours.   Yes [provider]  folic acid  (FOLVITE ) 1 MG tablet Take 1 mg by mouth daily.    [provider]  hydroxyurea   (HYDREA ) 500 MG capsule Take 1,500 mg by mouth daily. 02/05/23   [provider]  naloxone  (NARCAN ) nasal spray 4 mg/0.1 mL Use as needed for opiod overdose 03/04/22   Paseda, Folashade R, FNP  valACYclovir  (VALTREX ) 1000 MG tablet Take 1 tablet by mouth daily. 01/15/23   [provider]  Vitamin D , Ergocalciferol , (DRISDOL ) 1.25 MG (50000 UNIT) CAPS capsule Take 1 capsule (50,000 Units total) by mouth every 7 (seven) days. Patient not taking: Reported on 01/12/2023 03/04/22   Paseda, Folashade R, FNP     Allergies:   No Known Allergies   Physical Exam:   Vitals:   Vitals:   08/04/23 1042 08/04/23 1222  BP:  121/75  Pulse:  80  Resp: 14 11  Temp:    SpO2:  96%    Physical Exam: Constitutional: Patient appears well-developed and well-nourished. Not in obvious distress. HENT: Normocephalic, atraumatic, External right and left ear normal. Oropharynx is clear and moist.  Eyes: Conjunctivae and EOM are normal. PERRLA, no scleral icterus. Neck: Normal ROM. Neck supple. No JVD. No tracheal deviation. No thyromegaly. CVS: RRR, S1/S2 +, no murmurs, no gallops, no carotid bruit.  Pulmonary: Effort and breath sounds normal, no stridor, rhonchi, wheezes, rales.  Abdominal: Soft. BS +, no distension, tenderness, rebound or guarding.  Musculoskeletal: Bilateral lower extremity tenderness.  Lymphadenopathy: No lymphadenopathy noted, cervical, inguinal or axillary Neuro: Alert. Normal reflexes, muscle tone coordination. No cranial nerve deficit. Skin: Skin is warm and dry. No rash noted. Not diaphoretic. No erythema. No pallor. Psychiatric: Normal mood and affect. Behavior, judgment, thought content normal.   Data Review:   CBC Recent Labs  Lab 07/29/23 1045 08/04/23 1045  WBC 10.0 7.8  HGB 11.3* 11.3*  HCT 33.7* 33.0*  PLT 243 281  MCV 73.3* 71.3*  MCH 24.6* 24.4*  MCHC 33.5 34.2  RDW 16.4* 15.0  LYMPHSABS 2.5 2.2  MONOABS 0.9 0.7  EOSABS 0.3 0.2  BASOSABS 0.0 0.0    ------------------------------------------------------------------------------------------------------------------  Chemistries  Recent Labs  Lab 08/04/23 1045  NA 138  K 4.1  CL 105  CO2 24  GLUCOSE 113*  BUN 6  CREATININE 0.72  CALCIUM 9.1  AST 18  ALT 15  ALKPHOS 78  BILITOT 0.9   ------------------------------------------------------------------------------------------------------------------ CrCl cannot be calculated (Unknown ideal weight.). ------------------------------------------------------------------------------------------------------------------ No results for input(s): TSH, T4TOTAL, T3FREE, THYROIDAB in the last 72 hours.  Invalid input(s): FREET3  Coagulation profile No results for input(s): INR, PROTIME in the last 168 hours. ------------------------------------------------------------------------------------------------------------------- No results for input(s): DDIMER in the last 72 hours. -------------------------------------------------------------------------------------------------------------------  Cardiac Enzymes No results for input(s): CKMB, TROPONINI, MYOGLOBIN in the last 168 hours.  Invalid input(s): CK ------------------------------------------------------------------------------------------------------------------    Component Value Date/Time   BNP 17.5 11/21/2021 1403    ---------------------------------------------------------------------------------------------------------------  Urinalysis    Component Value Date/Time   COLORURINE YELLOW 04/19/2023 1905   APPEARANCEUR HAZY (A) 04/19/2023 1905   LABSPEC 1.012 04/19/2023 1905   PHURINE 5.0 04/19/2023 1905   GLUCOSEU NEGATIVE 04/19/2023 1905   HGBUR NEGATIVE 04/19/2023 1905   BILIRUBINUR NEGATIVE 04/19/2023 1905   BILIRUBINUR Negative 04/27/2019 0935   KETONESUR NEGATIVE 04/19/2023 1905   PROTEINUR NEGATIVE 04/19/2023 1905   UROBILINOGEN 1.0  04/27/2019 0935   NITRITE NEGATIVE 04/19/2023 1905   LEUKOCYTESUR NEGATIVE 04/19/2023 1905    ----------------------------------------------------------------------------------------------------------------   Imaging Results:    No results found.   Assessment & Plan:  Principal Problem:   Sickle cell anemia with pain (HCC) Active Problems:   Leukocytosis   Chronic pain syndrome   Anemia of chronic disease   Current moderate episode of major depressive disorder (HCC)   Hb Sickle Cell Disease with crisis: Admit patient, start IVF 0.45% Saline @ 125 mls/hour, start weight based Dilaudid  PCA, start IV Toradol  15 mg Q 6 H, Restart oral home pain medications, Monitor vitals very closely, Re-evaluate pain scale regularly, 2 L of Oxygen  by [place], Patient will be re-evaluated for pain in the context of function and relationship to baseline as care progresses. Leukocytosis: Stable Anemia of Chronic Disease: Hemoglobin 11.3 g/dL.  No transfusion at this time we will continue to monitor daily CBC Chronic pain Syndrome: Continue oral home medication. Major depressive disorder: Stable continue medication as prescribed.  DVT Prophylaxis: Subcut Lovenox    AM Labs Ordered, also please review Full Orders  Family Communication: Admission, patient's condition and plan of care including tests being ordered have been discussed with the patient who indicate understanding and agree with the plan and Code Status.  Code Status: Full Code  Consults called: None    Admission status: Inpatient    Time spent in minutes : 50 minutes  Homer CHRISTELLA Cover NP 08/04/2023 at 2:36 PM

## 2023-08-04 NOTE — H&P (Signed)
 [facility] History and Physical  Stephanie Burch FMW:[id number] DOB: 06-06-1980 DOA: 08/04/2023  PCP: Patient, No Pcp Per   Chief Complaint:  Chief Complaint  Patient presents with   Sickle Cell Pain Crisis    HPI: Stephanie Burch is a 43 y.o. female with history of sickle cell disease, chronic pain syndrome, opiate dependence and tolerance, obesity, and anemia of chronic disease presents with complaints of generalized pain, that is consistent with her typical pain crisis. Patient says that pain intensity has been elevated over the past few days and unrelieved by her home medications. Pain is worse in bilateral extremities and back. Pain precipitated by change in weather.  Patient last had oxycodone  this am without sustained relief. Patient rates pain as 9/10, constant and throbbing. Patient denies fever, chills, chest pain, shortness of breath, nausea, vomiting, or diarrhea. No sick contacts or recent travel. Systemic Review: General: The patient denies anorexia, fever, weight loss Cardiac: Denies chest pain, syncope, palpitations, pedal edema  Respiratory: Denies cough, shortness of breath, wheezing GI: Denies severe indigestion/heartburn, abdominal pain, nausea, vomiting, diarrhea and constipation GU: Denies hematuria, incontinence, dysuria  Musculoskeletal: Bilateral extremity pain  Skin: Denies suspicious skin lesions Neurologic: Denies focal weakness or numbness, change in vision  Past Medical History:  Diagnosis Date   Acute kidney injury (HCC) 11/07/2019   Anemia    Chronic pain    COVID-19 virus detected 10/22/2019   Depression    HCAP (healthcare-associated pneumonia) 11/07/2019   History of blood transfusion 2013   x 2   HSV infection    Hypoxia    Pneumonia    x 1   Pulmonary edema 11/11/2019   Sepsis (HCC) 11/07/2019   Sickle cell anemia (HCC) 09/2021   gets them frequently   Tachycardia with heart rate 100-120 beats per minute    Vitamin B12  deficiency 12/2018   Vitamin D  deficiency     Past Surgical History:  Procedure Laterality Date   CESAREAN SECTION  2013   x 1   TOOTH EXTRACTION N/A 10/18/2021   Procedure: DENTAL RESTORATION/EXTRACTIONS;  Surgeon: Sheryle Hamilton, DMD;  Location: [facility] OR;  Service: Oral Surgery;  Laterality: N/A;    No Known Allergies  Family History  Problem Relation Age of Onset   Hypertension Mother    Sickle cell trait Mother    Diabetes Father    Sickle cell trait Father       Prior to Admission medications   Medication Sig Start Date End Date Taking? Authorizing Provider  acetaminophen  (TYLENOL ) 500 MG tablet Take 1,000 mg by mouth every 6 (six) hours as needed for mild pain, moderate pain or headache.   Yes [provider]  folic acid  (FOLVITE ) 1 MG tablet Take 1 mg by mouth daily.   Yes [provider]  ibuprofen  (ADVIL ) 800 MG tablet Take 1 tablet (800 mg total) by mouth every 8 (eight) hours as needed. 12/22/22  Yes Paseda, Folashade R, FNP  methadone  (DOLOPHINE ) 10 MG tablet Take 10 mg by mouth 3 (three) times daily. 03/26/23  Yes [provider]  oxycodone  (ROXICODONE ) 30 MG immediate release tablet Take 30 mg by mouth every 4 (four) hours.   Yes [provider]  valACYclovir  (VALTREX ) 1000 MG tablet Take 1 tablet by mouth daily. 01/15/23  Yes [provider]  hydroxyurea  (HYDREA ) 500 MG capsule Take 1,500 mg by mouth daily. 02/05/23   [provider]  naloxone  (NARCAN ) nasal spray 4 mg/0.1 mL Use  as needed for opiod overdose 03/04/22   Paseda, Folashade R, FNP  ondansetron  (ZOFRAN ) 4 MG tablet Take by mouth. 02/28/22   [provider]  Vitamin D , Ergocalciferol , (DRISDOL ) 1.25 MG (50000 UNIT) CAPS capsule Take 1 capsule (50,000 Units total) by mouth every 7 (seven) days. Patient not taking: Reported on 01/12/2023 03/04/22   Paseda, Folashade R, FNP     Physical Exam: There were no vitals filed for this visit.   General:  Alert, awake, afebrile, anicteric, not in obvious distress HEENT: Normocephalic and Atraumatic, Mucous membranes pink                PERRLA; EOM intact; No scleral icterus,                 Nares: Patent, Oropharynx: Clear, Fair Dentition                 Neck: FROM, no cervical lymphadenopathy, thyromegaly, carotid bruit or JVD;  CHEST WALL: No tenderness  CHEST: Normal respiration, clear to auscultation bilaterally  HEART: Regular rate and rhythm; no murmurs rubs or gallops  BACK: No kyphosis or scoliosis; no CVA tenderness  ABDOMEN: Positive Bowel Sounds, soft, non-tender; no masses, no organomegaly EXTREMITIES: No cyanosis, clubbing, or edema SKIN:  no rash or ulceration  CNS: Alert and Oriented x 4, Nonfocal exam, CN 2-12 intact  Labs on Admission:  Basic Metabolic Panel: No results for input(s): NA, K, CL, CO2, GLUCOSE, BUN, CREATININE, CALCIUM, MG, PHOS in the last 168 hours. Liver Function Tests: No results for input(s): AST, ALT, ALKPHOS, BILITOT, PROT, ALBUMIN in the last 168 hours. No results for input(s): LIPASE, AMYLASE in the last 168 hours. No results for input(s): AMMONIA in the last 168 hours. CBC: Recent Labs  Lab 07/29/23 1045  WBC 10.0  NEUTROABS 6.2  HGB 11.3*  HCT 33.7*  MCV 73.3*  PLT 243   Cardiac Enzymes: No results for input(s): CKTOTAL, CKMB, CKMBINDEX, TROPONINI in the last 168 hours.  BNP (last 3 results) No results for input(s): BNP in the last 8760 hours.  ProBNP (last 3 results) No results for input(s): PROBNP in the last 8760 hours.  CBG: No results for input(s): GLUCAP in the last 168 hours.   Assessment/Plan Active Problems:   * No active hospital problems. *  Admits to the Day [facility] for extended observation IVF 0 .45% Saline @ 75 mls/hour Weight based Dilaudid  PCA started within 30 minutes of admission IV Toradol  15 mg X 1 doses Acetaminophen  1000 mg x 1 dose Labs: CBCD, CMP,  Retic Count and LDH Monitor vitals very closely, Re-evaluate pain scale every hour 2 L of Oxygen  by [place] Patient will be re-evaluated for pain in the context of function and relationship to baseline as care progresses. If no significant relieve from pain (remains above 5/10) will transfer patient to inpatient services for further evaluation and management  Code Status: Full  Family Communication: None  DVT Prophylaxis: Ambulate as tolerated   Time spent: 35 Minutes  Homer CHRISTELLA Cover NP   If 7PM-7AM, please contact night-coverage [email or web address] 08/04/2023, 10:16 AM

## 2023-08-04 NOTE — Telephone Encounter (Signed)
 Pt called the day hospital wanting to come in today for sickle cell pain treatment. Pt reports 8/10 pain to both legs and left arm. Pt reports taking Oxycodone  30 mg at 730 AM. Pt denies being to the ER recently. Pt screened for COVID and denies symptoms and exposures. Pt denies fever, chest pain, N/V/D, abdominal pain. Homer Cover, NP notified and per provider pt can come to the day hospital today for treatment. Pt notified and verbalized understanding. Per pt her friend will be her transportation today.

## 2023-08-04 NOTE — Progress Notes (Signed)
 Patient admitted to the day hospital for sickle cell pain. Initially, patient reported bilateral arm and leg pain rated 8/10. For pain management, patient placed on Sickle Cell Dose Dilaudid  PCA, given 15 mg IV Toradol , 1000 mg Tylenol  and patient hydrated with IV fluids. Patient's pain remained elevated at 8/10. Provider notified and placed orders for admission to inpatient unit for continue pain management. Report called to [place], RN on [address]. Patient transported to [address] [place] in wheelchair with Dilaudid  PCA (settings 0.5/10/3 verified with Carolanne, Charity fundraiser). Vital signs wnl. Patient alert, oriented and stable at transfer.

## 2023-08-05 LAB — CBC
HCT: 31.7 % — ABNORMAL LOW (ref 36.0–46.0)
Hemoglobin: 10.7 g/dL — ABNORMAL LOW (ref 12.0–15.0)
MCH: 24.4 pg — ABNORMAL LOW (ref 26.0–34.0)
MCHC: 33.8 g/dL (ref 30.0–36.0)
MCV: 72.2 fL — ABNORMAL LOW (ref 80.0–100.0)
Platelets: 251 10*3/uL (ref 150–400)
RBC: 4.39 MIL/uL (ref 3.87–5.11)
RDW: 15 % (ref 11.5–15.5)
WBC: 7.9 10*3/uL (ref 4.0–10.5)
nRBC: 0.3 % — ABNORMAL HIGH (ref 0.0–0.2)

## 2023-08-05 NOTE — Progress Notes (Addendum)
 Patient ID: Stephanie Burch, female   DOB: [birth year], 43 y.o.   MRN: [id number] Subjective:  Stephanie Burch is a 43 y.o. female with history of sickle cell disease, chronic pain syndrome, opiate dependence and tolerance, obesity, and anemia of chronic disease presents with complaints of generalized pain, that is consistent with her typical pain crisis. Patient says that pain intensity has been elevated over the past few days and unrelieved by her home medications. Pain is worse in bilateral extremities and back.   Today she is reporting pain of 7/10. No new concerns. She denies cough, headache, N/V/D, no urinary symptoms.   Objective:  Vital signs in last 24 hours:  Vitals:   08/05/23 0821 08/05/23 1047 08/05/23 1229 08/05/23 1359  BP:  126/79  (!) 143/78  Pulse:  81  (!) 102  Resp: 16 14 16 17   Temp:  98.3 F (36.8 C)  98.9 F (37.2 C)  TempSrc:  Oral  Oral  SpO2:  98%  92%  Weight:      Height:        Intake/Output from previous day:   Intake/Output Summary (Last 24 hours) at 08/05/2023 1522 Last data filed at 08/05/2023 1300 Gross per 24 hour  Intake 1705.4 ml  Output --  Net 1705.4 ml    Physical Exam: General: Alert, awake, oriented x3, in no acute distress.  HEENT: [place]/AT PEERL, EOMI Neck: Trachea midline,  no masses, no thyromegal,y no JVD, no carotid bruit OROPHARYNX:  Moist, No exudate/ erythema/lesions.  Heart: Regular rate and rhythm, without murmurs, rubs, gallops, PMI non-displaced, no heaves or thrills on palpation.  Lungs: Clear to auscultation, no wheezing or rhonchi noted. No increased vocal fremitus resonant to percussion  Abdomen: Soft, nontender, nondistended, positive bowel sounds, no masses no hepatosplenomegaly noted..  Neuro: No focal neurological deficits noted cranial nerves II through XII grossly intact. DTRs 2+ bilaterally upper and lower extremities. Strength 5 out of 5 in bilateral upper and lower extremities. Musculoskeletal: Generalize body  tenderness /bilateral lower extremity tenderness.  Psychiatric: Patient alert and oriented x3, good insight and cognition, good recent to remote recall. Lymph node survey: No cervical axillary or inguinal lymphadenopathy noted.  Lab Results:  Basic Metabolic Panel:    Component Value Date/Time   NA 138 08/04/2023 1045   NA 142 09/11/2017 1431   K 4.1 08/04/2023 1045   CL 105 08/04/2023 1045   CO2 24 08/04/2023 1045   BUN 6 08/04/2023 1045   BUN 8 09/11/2017 1431   CREATININE 0.72 08/04/2023 1045   GLUCOSE 113 (H) 08/04/2023 1045   CALCIUM 9.1 08/04/2023 1045   CBC:    Component Value Date/Time   WBC 7.9 08/05/2023 0612   HGB 10.7 (L) 08/05/2023 0612   HGB 13.0 09/11/2017 1431   HCT 31.7 (L) 08/05/2023 0612   HCT 38.4 09/11/2017 1431   PLT 251 08/05/2023 0612   PLT 258 09/11/2017 1431   MCV 72.2 (L) 08/05/2023 0612   MCV 75 (L) 09/11/2017 1431   NEUTROABS 4.6 08/04/2023 1045   NEUTROABS 3.9 09/11/2017 1431   LYMPHSABS 2.2 08/04/2023 1045   LYMPHSABS 2.2 09/11/2017 1431   MONOABS 0.7 08/04/2023 1045   EOSABS 0.2 08/04/2023 1045   EOSABS 0.1 09/11/2017 1431   BASOSABS 0.0 08/04/2023 1045   BASOSABS 0.0 09/11/2017 1431    No results found for this or any previous visit (from the past 240 hours).  Studies/Results: No results found.  Medications: Scheduled Meds:  enoxaparin  (LOVENOX ) injection  60 mg Subcutaneous Q24H   HYDROmorphone    Intravenous Q4H   ketorolac   15 mg Intravenous Q6H   methadone   10 mg Oral TID   oxycodone   30 mg Oral Q4H   senna-docusate  1 tablet Oral BID   Continuous Infusions:  sodium chloride  Stopped (08/04/23 2002)   PRN Meds:.acetaminophen , diphenhydrAMINE , naloxone  **AND** sodium chloride  flush, ondansetron  (ZOFRAN ) IV, polyethylene glycol  Consultants: None  Procedures: None  Antibiotics: None  Assessment/Plan: Principal Problem:   Sickle cell anemia with pain (HCC) Active Problems:   Leukocytosis   Chronic pain  syndrome   Anemia of chronic disease   Current moderate episode of major depressive disorder (HCC)   Obesity, morbid, 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 Monitor vitals very closely, Re-evaluate pain scale regularly, 2 L of Oxygen  by [place]. Patient encouraged to ambulate on the hallway today.  Leukocytosis: Stable Anemia of Chronic Disease: Hgb 10.7g/dl, slightly lower than patients baseline. No transfusion at this time. Will continue to monitor daily CBC.   Chronic pain Syndrome: Continue oral pain medication  Obesity, Morbid, BMI 50 or greater (HCC): Encouraged lifestyle modification, diet and exercise. Out patient weight loss management clinic per PCP referral.    Code Status: Full Code Family Communication: N/A Disposition Plan: Not yet ready for discharge  Homer CHRISTELLA Cover NP   If 7PM-7AM, please contact night-coverage.  08/05/2023, 3:22 PM  LOS: 1 day

## 2023-08-05 NOTE — Plan of Care (Signed)
  Problem: Education: Goal: Knowledge of vaso-occlusive preventative measures will improve Outcome: Progressing   Problem: Bowel/Gastric: Goal: Gut motility will be maintained Outcome: Progressing   Problem: Tissue Perfusion: Goal: Complications related to inadequate tissue perfusion will be avoided or minimized Outcome: Progressing   Problem: Respiratory: Goal: Pulmonary complications will be avoided or minimized Outcome: Progressing   Problem: Education: Goal: Knowledge of General Education information will improve Description: Including pain rating scale, medication(s)/side effects and non-pharmacologic comfort measures Outcome: Progressing

## 2023-08-05 NOTE — Plan of Care (Signed)

## 2023-08-06 LAB — CBC
HCT: 30.9 % — ABNORMAL LOW (ref 36.0–46.0)
Hemoglobin: 10.4 g/dL — ABNORMAL LOW (ref 12.0–15.0)
MCH: 24.4 pg — ABNORMAL LOW (ref 26.0–34.0)
MCHC: 33.7 g/dL (ref 30.0–36.0)
MCV: 72.4 fL — ABNORMAL LOW (ref 80.0–100.0)
Platelets: 245 10*3/uL (ref 150–400)
RBC: 4.27 MIL/uL (ref 3.87–5.11)
RDW: 15.1 % (ref 11.5–15.5)
WBC: 8.4 10*3/uL (ref 4.0–10.5)
nRBC: 0.4 % — ABNORMAL HIGH (ref 0.0–0.2)

## 2023-08-06 NOTE — Discharge Summary (Signed)
 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 Stephanie Burch is instructed to increase activity slowly and balance with rest for the next few days, and use prescribed medication to complete treatment of pain  Diet: Regular Wt Readings from Last 3 Encounters:  08/04/23 (!) 142.9 kg  07/15/23 (!) 142.9 kg  06/16/23 (!) 157.6 kg    History of present illness:  Stephanie Burch  is a 43 y.o. female with history of sickle cell disease, chronic pain syndrome, opiate dependence and tolerance, obesity, and anemia of chronic disease presents with complaints of generalized pain, that is consistent with her typical pain crisis. Patient says that pain intensity has been elevated over the past few days and unrelieved by her home medications. Pain is worse in bilateral extremities and back. Pain precipitated by change in weather.  Patient last had oxycodone  this AM without sustained relief. Patient rates pain as 8/10, constant and throbbing. Patient denies fever, chills, chest pain, shortness of breath, nausea, vomiting, or diarrhea. No sick contacts or recent travel.   Day hospital course: Patient treated with IV fluids, IV pain medication with no changes or resolution to pain symptoms.  She is admitted in-patient for ongoing sickle cell pain management.    Hospital Course:  Patient was admitted for sickle cell pain crisis and managed appropriately  with IVF, IV Dilaudid  via PCA and IV Toradol , as well as other adjunct therapies per sickle cell pain management protocols.  Patient is reporting significant improvement to pain today at baseline.  She is able to ambulate without assistance and tolerate p.o. without nausea or vomiting.  Patient asked to be discharged. She was therefore discharged home today in a hemodynamically stable condition.   Magen was counseled extensively about nonpharmacologic means of pain management, patient verbalized understanding and was appreciative of  the care received during this admission.   We discussed the need for good hydration, monitoring of hydration status, avoidance of heat, cold, stress, and infection triggers. We discussed the need to be adherent with taking  other home medications. Patient was reminded of the need to seek medical attention immediately if any symptom of bleeding, anemia, or infection occurs.  Discharge Exam: Vitals:   08/06/23 1033 08/06/23 1221  BP: (!) 144/71   Pulse: 91   Resp: 18 12  Temp: 97.8 F (36.6 C)   SpO2: 96%    Vitals:   08/06/23 0530 08/06/23 0809 08/06/23 1033 08/06/23 1221  BP:   (!) 144/71   Pulse:   91   Resp: 16 16 18 12   Temp:   97.8 F (36.6 C)   TempSrc:   Oral   SpO2:   96%   Weight:      Height:        General appearance : Awake, alert, not in any distress. Speech Clear. Not toxic looking HEENT: Atraumatic and Normocephalic, pupils equally reactive to light and accomodation Neck: Supple, no  JVD. No cervical lymphadenopathy.  Chest: Good air entry bilaterally, no added sounds  CVS: S1 S2 regular, no murmurs.  Abdomen: Bowel sounds present, Non tender and not distended with no gaurding, rigidity or rebound. Extremities: B/L Lower Ext shows no edema, both legs are warm to touch Neurology: Awake alert, and oriented X 3, CN II-XII intact, Non focal Skin: No Rash  Discharge Instructions  Discharge Instructions     Call MD for:  severe  uncontrolled pain   Complete by: As directed    Call MD for:  temperature >100.4   Complete by: As directed    Diet - low sodium heart healthy   Complete by: As directed    Increase activity slowly   Complete by: As directed       Allergies as of 08/06/2023   No Known Allergies      Medication List     TAKE these medications    acetaminophen  500 MG tablet Commonly known as: TYLENOL  Take 1,000 mg by mouth daily as needed for mild pain (pain score 1-3), moderate pain (pain score 4-6) or headache.   ibuprofen  200 MG tablet Commonly known as: ADVIL  Take 800 mg by mouth daily as needed for mild pain (pain score 1-3) or moderate pain (pain score 4-6).   methadone  10 MG tablet Commonly known as: DOLOPHINE  Take 10 mg by mouth 3 (three) times daily.   naloxone  4 MG/0.1ML Liqd nasal spray kit Commonly known as: NARCAN  Use as needed for opiod overdose   oxycodone  30 MG immediate release tablet Commonly known as: ROXICODONE  Take 30 mg by mouth every 4 (four) hours.   promethazine  25 MG tablet Commonly known as: PHENERGAN  Take 25 mg by mouth daily as needed for nausea or vomiting.   valACYclovir  1000 MG tablet Commonly known as: VALTREX  Take 1 tablet by mouth daily.        The results of significant diagnostics from this hospitalization (including imaging, microbiology, ancillary and laboratory) are listed below for reference.    Significant Diagnostic Studies: DG Chest Port 1 View Result Date: 07/15/2023 CLINICAL DATA:  Sickle cell crisis pain EXAM: PORTABLE CHEST 1 VIEW COMPARISON:  01/31/2022 FINDINGS: Stable cardiomegaly. Prominent pulmonary arteries. Bibasilar atelectasis or infiltrates. No pleural effusion or pneumothorax. No displaced rib fractures. IMPRESSION: Bibasilar atelectasis or infiltrates. Electronically Signed   By: Norman Gatlin M.D.   On: 07/15/2023 21:34    Microbiology: No results found for this or any previous visit (from the past 240 hours).    Labs: Basic Metabolic Panel: Recent Labs  Lab 08/04/23 1045  NA 138  K 4.1  CL 105  CO2 24  GLUCOSE 113*  BUN 6  CREATININE 0.72  CALCIUM 9.1   Liver Function Tests: Recent Labs  Lab 08/04/23 1045  AST 18  ALT 15  ALKPHOS 78  BILITOT 0.9  PROT 7.5  ALBUMIN 3.6   No results for input(s): LIPASE, AMYLASE in the last 168 hours. No results for input(s): AMMONIA in the last 168 hours. CBC: Recent Labs  Lab 08/04/23 1045 08/05/23 0612 08/06/23 0539  WBC 7.8 7.9 8.4  NEUTROABS 4.6  --   --   HGB 11.3* 10.7* 10.4*  HCT 33.0* 31.7* 30.9*  MCV 71.3* 72.2* 72.4*  PLT 281 251 245   Cardiac Enzymes: No results for input(s): CKTOTAL, CKMB, CKMBINDEX, TROPONINI in the last 168 hours. BNP: Invalid input(s): POCBNP CBG: No results for input(s): GLUCAP in the last 168 hours.  Time coordinating discharge: 50  minutes  Signed:  Homer CHRISTELLA Cover NP  [facility] 08/06/2023, 1:31 PM

## 2023-08-06 NOTE — Plan of Care (Signed)

## 2023-08-09 ENCOUNTER — Emergency Department (HOSPITAL_BASED_OUTPATIENT_CLINIC_OR_DEPARTMENT_OTHER): Admission: EM | Admit: 2023-08-09 | Discharge: 2023-08-09 | Disposition: A | Discharge status: Home / Self Care

## 2023-08-09 ENCOUNTER — Encounter (HOSPITAL_BASED_OUTPATIENT_CLINIC_OR_DEPARTMENT_OTHER): Payer: Self-pay

## 2023-08-09 DIAGNOSIS — D57219 Sickle-cell/Hb-C disease with crisis, unspecified: Secondary | ICD-10-CM | POA: Insufficient documentation

## 2023-08-09 DIAGNOSIS — D5700 Hb-SS disease with crisis, unspecified: Secondary | ICD-10-CM | POA: Diagnosis not present

## 2023-08-09 LAB — CBC WITH DIFFERENTIAL/PLATELET
Abs Immature Granulocytes: 0.05 K/uL (ref 0.00–0.07)
Basophils Absolute: 0 K/uL (ref 0.0–0.1)
Basophils Relative: 1 %
Eosinophils Absolute: 0.1 K/uL (ref 0.0–0.5)
Eosinophils Relative: 1 %
HCT: 28.5 % — ABNORMAL LOW (ref 36.0–46.0)
Hemoglobin: 10 g/dL — ABNORMAL LOW (ref 12.0–15.0)
Immature Granulocytes: 1 %
Lymphocytes Relative: 28 %
Lymphs Abs: 1.8 K/uL (ref 0.7–4.0)
MCH: 24.2 pg — ABNORMAL LOW (ref 26.0–34.0)
MCHC: 35.1 g/dL (ref 30.0–36.0)
MCV: 69 fL — ABNORMAL LOW (ref 80.0–100.0)
Monocytes Absolute: 0.3 K/uL (ref 0.1–1.0)
Monocytes Relative: 5 %
Neutro Abs: 4.1 K/uL (ref 1.7–7.7)
Neutrophils Relative %: 64 %
Platelets: 231 K/uL (ref 150–400)
RBC: 4.13 MIL/uL (ref 3.87–5.11)
RDW: 15.4 % (ref 11.5–15.5)
WBC: 6.3 K/uL (ref 4.0–10.5)
nRBC: 0 % (ref 0.0–0.2)

## 2023-08-09 LAB — BASIC METABOLIC PANEL WITH GFR
Anion gap: 11 (ref 5–15)
BUN: 6 mg/dL (ref 6–20)
CO2: 23 mmol/L (ref 22–32)
Calcium: 8.8 mg/dL — ABNORMAL LOW (ref 8.9–10.3)
Chloride: 108 mmol/L (ref 98–111)
Creatinine, Ser: 0.54 mg/dL (ref 0.44–1.00)
GFR, Estimated: >60 mL/min (ref >60)
Glucose, Bld: 86 mg/dL (ref 70–99)
Potassium: 4 mmol/L (ref 3.5–5.1)
Sodium: 142 mmol/L (ref 135–145)

## 2023-08-09 LAB — RETICULOCYTES
Immature Retic Fract: 21.4 % — ABNORMAL HIGH (ref 2.3–15.9)
RBC.: 4.93 MIL/uL (ref 3.87–5.11)
Retic Count, Absolute: 130.6 K/uL (ref 19.0–186.0)
Retic Ct Pct: 2.7 % (ref 0.4–3.1)

## 2023-08-09 MED ORDER — KETOROLAC TROMETHAMINE 15 MG/ML IJ SOLN
15.0000 mg | Freq: Once | INTRAMUSCULAR | Status: AC
  Administered 2023-08-09: 15 mg via INTRAVENOUS
  Filled 2023-08-09: qty 1

## 2023-08-09 MED ORDER — HYDROMORPHONE HCL 1 MG/ML IJ SOLN
2.0000 mg | INTRAMUSCULAR | Status: AC
  Administered 2023-08-09: 2 mg via INTRAVENOUS
  Filled 2023-08-09: qty 2

## 2023-08-09 MED ORDER — DIPHENHYDRAMINE HCL 50 MG/ML IJ SOLN
INTRAMUSCULAR | Status: AC
  Filled 2023-08-09: qty 1

## 2023-08-09 MED ORDER — OXYCODONE HCL 5 MG PO TABS
30.0000 mg | ORAL_TABLET | Freq: Once | ORAL | Status: AC
  Administered 2023-08-09: 30 mg via ORAL
  Filled 2023-08-09: qty 6

## 2023-08-09 MED ORDER — ONDANSETRON HCL 4 MG/2ML IJ SOLN
4.0000 mg | INTRAMUSCULAR | Status: DC | PRN
  Filled 2023-08-09: qty 2

## 2023-08-09 MED ORDER — HYDROMORPHONE HCL 1 MG/ML IJ SOLN
2.0000 mg | INTRAMUSCULAR | Status: AC
  Administered 2023-08-09: 2 mg via INTRAVENOUS

## 2023-08-09 MED ORDER — SODIUM CHLORIDE 0.9 % IV SOLN
12.5000 mg | Freq: Once | INTRAVENOUS | Status: AC
  Administered 2023-08-09: 12.5 mg via INTRAVENOUS
  Filled 2023-08-09: qty 0.25

## 2023-08-09 MED ORDER — HYDROMORPHONE HCL 1 MG/ML IJ SOLN
INTRAMUSCULAR | Status: AC
  Filled 2023-08-09: qty 2

## 2023-08-09 NOTE — ED Notes (Signed)
 Pt left prior to her DC papers being printed. Her ride was waiting on her.

## 2023-08-09 NOTE — Discharge Instructions (Signed)
 1.  Follow-up at the sickle cell clinic and have a recheck with your provider in the next several days.

## 2023-08-09 NOTE — ED Triage Notes (Signed)
 Admitted to [facility] last week for sickle cell crisis. Thinks she left too early because when the pain meds wore off the pain continued and home meds not helping. C/o bilateral leg pain.

## 2023-08-09 NOTE — ED Provider Notes (Addendum)
 [place] [facility] Provider Note   CSN: [id number] Arrival date & time: 08/09/23  1239     Patient presents with: Sickle Cell Pain Crisis   Stephanie Burch is a 43 y.o. female.   43 year old female presenting emergency department with sickle cell pain.  Recently admitted to [facility] last week for sickle cell pain.  Reports that maybe she was discharged too early as her pain never quite resolved.  Reports that her pain is worse today and not having improvement with home pain medications.  No fevers no chills no chest pain.  This is her typical pain pattern and character.   Sickle Cell Pain Crisis      Prior to Admission medications   Medication Sig Start Date End Date Taking? Authorizing Provider  acetaminophen  (TYLENOL ) 500 MG tablet Take 1,000 mg by mouth daily as needed for mild pain (pain score 1-3), moderate pain (pain score 4-6) or headache.    [provider]  ibuprofen  (ADVIL ) 200 MG tablet Take 800 mg by mouth daily as needed for mild pain (pain score 1-3) or moderate pain (pain score 4-6).    [provider]  methadone  (DOLOPHINE ) 10 MG tablet Take 10 mg by mouth 3 (three) times daily. 03/26/23   [provider]  naloxone  (NARCAN ) nasal spray 4 mg/0.1 mL Use as needed for opiod overdose 03/04/22   Paseda, Folashade R, FNP  oxycodone  (ROXICODONE ) 30 MG immediate release tablet Take 30 mg by mouth every 4 (four) hours.    [provider]  promethazine  (PHENERGAN ) 25 MG tablet Take 25 mg by mouth daily as needed for nausea or vomiting.    [provider]  valACYclovir  (VALTREX ) 1000 MG tablet Take 1 tablet by mouth daily. 01/15/23   [provider]    Allergies: Patient has no known allergies.    Review of Systems  Updated Vital Signs BP (!) 133/118   Pulse 74   Temp 97.6 F (36.4 C)   Resp 16   Ht 5' 5 (1.651 m)   Wt (!) 142.9 kg   LMP 08/02/2023 (Approximate)   SpO2 98%    BMI 52.42 kg/m   Physical Exam Vitals and nursing note reviewed.  HENT:     Head: Normocephalic.  Eyes:     Pupils: Pupils are equal, round, and reactive to light.  Cardiovascular:     Rate and Rhythm: Normal rate and regular rhythm.  Pulmonary:     Effort: Pulmonary effort is normal.     Breath sounds: Normal breath sounds.  Abdominal:     General: Abdomen is flat. There is no distension.     Tenderness: There is no abdominal tenderness. There is no guarding or rebound.  Musculoskeletal:        General: Normal range of motion.  Skin:    General: Skin is warm and dry.     Capillary Refill: Capillary refill takes less than 2 seconds.  Neurological:     Mental Status: She is alert and oriented to person, place, and time.  Psychiatric:        Mood and Affect: Mood normal.        Behavior: Behavior normal.     (all labs ordered are listed, but only abnormal results are displayed) Labs Reviewed  CBC WITH DIFFERENTIAL/PLATELET  RETICULOCYTES  BASIC METABOLIC PANEL WITH GFR  PREGNANCY, URINE    EKG: None  Radiology: No results found.   SABRAUltrasound ED Peripheral IV (  Provider)  Date/Time: 08/09/2023 3:38 PM  Performed by: Neysa Caron PARAS, DO Authorized by: Neysa Caron PARAS, DO   Procedure details:    Indications: multiple failed IV attempts     Skin Prep: chlorhexidine  gluconate     Location:  Left anterior forearm   Angiocath:  20 G   Bedside Ultrasound Guided: Yes     Images: not archived     Patient tolerated procedure without complications: Yes     Dressing applied: Yes      Medications Ordered in the ED  HYDROmorphone  (DILAUDID ) injection 2 mg (has no administration in time range)  HYDROmorphone  (DILAUDID ) injection 2 mg (has no administration in time range)  HYDROmorphone  (DILAUDID ) injection 2 mg (has no administration in time range)  diphenhydrAMINE  (BENADRYL ) 12.5 mg in sodium chloride  0.9 % 50 mL IVPB (has no administration in time range)   ondansetron  (ZOFRAN ) injection 4 mg (has no administration in time range)  ketorolac  (TORADOL ) 15 MG/ML injection 15 mg (has no administration in time range)  diphenhydrAMINE  (BENADRYL ) 50 MG/ML injection (has no administration in time range)                                    Medical Decision Making This is a 43 year old female with morbid obesity, sickle cell disease on chronic opioids presenting emergency department for sickle cell pain.  Pain in her legs; typical character and location.  No erythema warmth or overt infectious process identified on exam.  Soft compartments.  2+ DP pulses bilaterally.  Overall reassuring exam.  Per chart review was discharged on 7/3 for sickle cell pain.  Reports that symptoms have not resolved and worsened.  He is afebrile nontachycardic, hypertensive.  Maintaining oxygen  saturation on room air.  Screening labs ordered as well as IV pain medications.  Care signed out to afternoon team; disposition pending pain medications reevaluation.  Amount and/or Complexity of Data Reviewed Labs: ordered.  Risk Prescription drug management. Parenteral controlled substances. Diagnosis or treatment significantly limited by social determinants of health. Risk Details: Poor health literacy.       Final diagnoses:  None    ED Discharge Orders     None          Neysa Caron PARAS, DO 08/09/23 1506    Neysa Caron PARAS, DO 08/09/23 1538

## 2023-08-09 NOTE — ED Notes (Signed)
 IV access x 2 attempted without success.  MD aware

## 2023-08-09 NOTE — ED Provider Notes (Signed)
  Physical Exam  BP (!) 133/118   Pulse 74   Temp 98.2 F (36.8 C) (Oral)   Resp 16   Ht 5' 5 (1.651 m)   Wt (!) 142.9 kg   LMP 08/02/2023 (Approximate)   SpO2 98%   BMI 52.42 kg/m   Physical Exam  Procedures  Procedures  ED Course / MDM    Medical Decision Making Amount and/or Complexity of Data Reviewed Labs: ordered.  Risk Prescription drug management.   I rechecked the patient after completing 3 IV doses of Dilaudid .  She reports she feels a lot better than she did.  Patient reports she takes 30 mg oxycodone  IR at home.  Will administer oral dose.  At this time she is clinically well in appearance.  Patient counseled to follow-up tomorrow at the sickle cell walk in clinic if needed, or follow-up with her primary provider.       Armenta Canning, MD 08/09/23 [phone]

## 2023-08-10 ENCOUNTER — Other Ambulatory Visit: Payer: Self-pay

## 2023-08-10 ENCOUNTER — Inpatient Hospital Stay (HOSPITAL_COMMUNITY)
Admission: EM | Admit: 2023-08-10 | Discharge: 2023-08-13 | DRG: 812 | Disposition: A | Discharge status: Home / Self Care | Attending: Internal Medicine | Admitting: Internal Medicine

## 2023-08-10 ENCOUNTER — Encounter (HOSPITAL_COMMUNITY): Payer: Self-pay | Admitting: Internal Medicine

## 2023-08-10 DIAGNOSIS — K5903 Drug induced constipation: Secondary | ICD-10-CM | POA: Diagnosis present

## 2023-08-10 DIAGNOSIS — D638 Anemia in other chronic diseases classified elsewhere: Secondary | ICD-10-CM | POA: Diagnosis present

## 2023-08-10 DIAGNOSIS — Z6841 Body Mass Index (BMI) 40.0 and over, adult: Secondary | ICD-10-CM | POA: Diagnosis not present

## 2023-08-10 DIAGNOSIS — G894 Chronic pain syndrome: Secondary | ICD-10-CM | POA: Diagnosis present

## 2023-08-10 DIAGNOSIS — D5700 Hb-SS disease with crisis, unspecified: Secondary | ICD-10-CM | POA: Diagnosis present

## 2023-08-10 DIAGNOSIS — Z8249 Family history of ischemic heart disease and other diseases of the circulatory system: Secondary | ICD-10-CM

## 2023-08-10 DIAGNOSIS — D72829 Elevated white blood cell count, unspecified: Secondary | ICD-10-CM | POA: Diagnosis present

## 2023-08-10 DIAGNOSIS — Z79891 Long term (current) use of opiate analgesic: Secondary | ICD-10-CM | POA: Diagnosis not present

## 2023-08-10 LAB — COMPREHENSIVE METABOLIC PANEL WITH GFR
ALT: 12 U/L (ref 0–44)
AST: 12 U/L — ABNORMAL LOW (ref 15–41)
Albumin: 3.6 g/dL (ref 3.5–5.0)
Alkaline Phosphatase: 80 U/L (ref 38–126)
Anion gap: 9 (ref 5–15)
BUN: 6 mg/dL (ref 6–20)
CO2: 24 mmol/L (ref 22–32)
Calcium: 9.1 mg/dL (ref 8.9–10.3)
Chloride: 106 mmol/L (ref 98–111)
Creatinine, Ser: 0.55 mg/dL (ref 0.44–1.00)
GFR, Estimated: >60 mL/min (ref >60)
Glucose, Bld: 82 mg/dL (ref 70–99)
Potassium: 4 mmol/L (ref 3.5–5.1)
Sodium: 139 mmol/L (ref 135–145)
Total Bilirubin: 0.7 mg/dL (ref 0.0–1.2)
Total Protein: 7.8 g/dL (ref 6.5–8.1)

## 2023-08-10 LAB — CBC WITH DIFFERENTIAL/PLATELET
Abs Immature Granulocytes: 0.03 K/uL (ref 0.00–0.07)
Basophils Absolute: 0 K/uL (ref 0.0–0.1)
Basophils Relative: 0 %
Eosinophils Absolute: 0.2 K/uL (ref 0.0–0.5)
Eosinophils Relative: 2 %
HCT: 33.4 % — ABNORMAL LOW (ref 36.0–46.0)
Hemoglobin: 11.4 g/dL — ABNORMAL LOW (ref 12.0–15.0)
Immature Granulocytes: 0 %
Lymphocytes Relative: 30 %
Lymphs Abs: 2.3 K/uL (ref 0.7–4.0)
MCH: 23.9 pg — ABNORMAL LOW (ref 26.0–34.0)
MCHC: 34.1 g/dL (ref 30.0–36.0)
MCV: 70.2 fL — ABNORMAL LOW (ref 80.0–100.0)
Monocytes Absolute: 0.5 K/uL (ref 0.1–1.0)
Monocytes Relative: 6 %
Neutro Abs: 4.6 K/uL (ref 1.7–7.7)
Neutrophils Relative %: 62 %
Platelets: 274 K/uL (ref 150–400)
RBC: 4.76 MIL/uL (ref 3.87–5.11)
RDW: 14.9 % (ref 11.5–15.5)
WBC: 7.6 K/uL (ref 4.0–10.5)
nRBC: 0 % (ref 0.0–0.2)

## 2023-08-10 LAB — HCG, SERUM, QUALITATIVE: Preg, Serum: NEGATIVE

## 2023-08-10 LAB — MAGNESIUM: Magnesium: 1.9 mg/dL (ref 1.7–2.4)

## 2023-08-10 LAB — RETICULOCYTES
Immature Retic Fract: 26.5 % — ABNORMAL HIGH (ref 2.3–15.9)
RBC.: 4.62 MIL/uL (ref 3.87–5.11)
Retic Count, Absolute: 106.3 K/uL (ref 19.0–186.0)
Retic Ct Pct: 2.3 % (ref 0.4–3.1)

## 2023-08-10 LAB — CK: Total CK: 35 U/L — ABNORMAL LOW (ref 38–234)

## 2023-08-10 LAB — PHOSPHORUS: Phosphorus: 3.6 mg/dL (ref 2.5–4.6)

## 2023-08-10 LAB — LACTATE DEHYDROGENASE: LDH: 122 U/L (ref 98–192)

## 2023-08-10 MED ORDER — HYDROMORPHONE HCL 1 MG/ML IJ SOLN
2.0000 mg | INTRAMUSCULAR | Status: AC | PRN
  Administered 2023-08-10: 2 mg via INTRAVENOUS
  Filled 2023-08-10: qty 2

## 2023-08-10 MED ORDER — SENNOSIDES-DOCUSATE SODIUM 8.6-50 MG PO TABS
1.0000 | ORAL_TABLET | Freq: Two times a day (BID) | ORAL | Status: DC
  Filled 2023-08-10 (×5): qty 1

## 2023-08-10 MED ORDER — HYDROMORPHONE HCL 1 MG/ML IJ SOLN
2.0000 mg | INTRAMUSCULAR | Status: AC
  Administered 2023-08-10: 2 mg via INTRAVENOUS
  Filled 2023-08-10: qty 2

## 2023-08-10 MED ORDER — ENOXAPARIN SODIUM 40 MG/0.4ML IJ SOSY
40.0000 mg | PREFILLED_SYRINGE | INTRAMUSCULAR | Status: DC
  Administered 2023-08-11 – 2023-08-13 (×3): 40 mg via SUBCUTANEOUS
  Filled 2023-08-10 (×3): qty 0.4

## 2023-08-10 MED ORDER — VALACYCLOVIR HCL 500 MG PO TABS
1000.0000 mg | ORAL_TABLET | Freq: Every day | ORAL | Status: DC
  Administered 2023-08-11 – 2023-08-13 (×3): 1000 mg via ORAL
  Filled 2023-08-10 (×3): qty 2

## 2023-08-10 MED ORDER — ONDANSETRON HCL 4 MG/2ML IJ SOLN
4.0000 mg | INTRAMUSCULAR | Status: DC | PRN
  Administered 2023-08-11: 4 mg via INTRAVENOUS
  Filled 2023-08-10: qty 2

## 2023-08-10 MED ORDER — OXYCODONE HCL 5 MG PO TABS
15.0000 mg | ORAL_TABLET | Freq: Once | ORAL | Status: AC
  Administered 2023-08-10: 15 mg via ORAL
  Filled 2023-08-10: qty 3

## 2023-08-10 MED ORDER — METHADONE HCL 10 MG PO TABS
10.0000 mg | ORAL_TABLET | Freq: Three times a day (TID) | ORAL | Status: DC
  Administered 2023-08-10 – 2023-08-13 (×8): 10 mg via ORAL
  Filled 2023-08-10 (×8): qty 1

## 2023-08-10 MED ORDER — DEXTROSE-SODIUM CHLORIDE 5-0.45 % IV SOLN: INTRAVENOUS | Status: DC

## 2023-08-10 MED ORDER — ONDANSETRON HCL 4 MG PO TABS
4.0000 mg | ORAL_TABLET | ORAL | Status: DC | PRN
Start: 2023-08-10 — End: 2023-08-13

## 2023-08-10 MED ORDER — SODIUM CHLORIDE 0.9 % IV SOLN
12.5000 mg | Freq: Once | INTRAVENOUS | Status: AC
  Administered 2023-08-10: 12.5 mg via INTRAVENOUS
  Filled 2023-08-10: qty 12.5

## 2023-08-10 MED ORDER — DIPHENHYDRAMINE HCL 25 MG PO CAPS: 25.0000 mg | ORAL_CAPSULE | ORAL | Status: DC | PRN

## 2023-08-10 MED ORDER — HYDROXYZINE HCL 25 MG PO TABS: 25.0000 mg | ORAL_TABLET | ORAL | Status: DC | PRN

## 2023-08-10 MED ORDER — KETOROLAC TROMETHAMINE 15 MG/ML IJ SOLN
15.0000 mg | Freq: Four times a day (QID) | INTRAMUSCULAR | Status: DC
  Administered 2023-08-10 – 2023-08-13 (×11): 15 mg via INTRAVENOUS
  Filled 2023-08-10 (×11): qty 1

## 2023-08-10 MED ORDER — BISACODYL 10 MG RE SUPP: 10.0000 mg | Freq: Every day | RECTAL | Status: DC | PRN

## 2023-08-10 MED ORDER — HYDROMORPHONE HCL 1 MG/ML IJ SOLN
1.0000 mg | Freq: Once | INTRAMUSCULAR | Status: DC
  Filled 2023-08-10: qty 1

## 2023-08-10 MED ORDER — HYDROMORPHONE 1 MG/ML IV SOLN
INTRAVENOUS | Status: DC
  Administered 2023-08-10: 30 mg via INTRAVENOUS
  Administered 2023-08-11: 8 mg via INTRAVENOUS
  Filled 2023-08-10: qty 30

## 2023-08-10 MED ORDER — NALOXONE HCL 0.4 MG/ML IJ SOLN: 0.4000 mg | INTRAMUSCULAR | Status: DC | PRN

## 2023-08-10 MED ORDER — SODIUM CHLORIDE 0.9% FLUSH: 9.0000 mL | INTRAVENOUS | Status: DC | PRN

## 2023-08-10 MED ORDER — POLYETHYLENE GLYCOL 3350 17 G PO PACK: 17.0000 g | PACK | Freq: Every day | ORAL | Status: DC | PRN

## 2023-08-10 MED ORDER — KETOROLAC TROMETHAMINE 15 MG/ML IJ SOLN
15.0000 mg | INTRAMUSCULAR | Status: AC
  Administered 2023-08-10: 15 mg via INTRAVENOUS
  Filled 2023-08-10: qty 1

## 2023-08-10 NOTE — ED Notes (Signed)
 IV team at bedside

## 2023-08-10 NOTE — ED Notes (Signed)
 IV team only able to draw a lavender top. Phlebotomy staff unable to come draw labs.

## 2023-08-10 NOTE — ED Provider Notes (Signed)
 [place] EM[facility]ENCY DEPARTMENT AT [facility] Provider Note   CSN: [id number] Arrival date & time: 08/10/23  1457     Patient presents with: Sickle Cell Pain Crisis   Stephanie Burch is a 43 y.o. female.    Sickle Cell Pain Crisis    43 year old female with medical history significant for sickle cell anemia presenting to the emergency department with a chief complaint of pain associated with her sickle cell disease.  The patient states that she has had pain over the past couple of days that has not been amenable to treatment with her home medications of oxycodone  and methadone .  She was seen in the emergency department yesterday for the same complaint and felt a little bit better after treatment in the ER and was subsequently discharged.  She called the sickle cell infusion center this morning due to persistent pain not touched by her home medications, described as located in her bilateral lower extremities and was advised to present to the emergency department because the infusion center was full.  She denies any fevers or chills, cough, chest pain or shortness of breath.  No known sick contacts.  Prior to Admission medications   Medication Sig Start Date End Date Taking? Authorizing Provider  acetaminophen  (TYLENOL ) 500 MG tablet Take 1,000 mg by mouth daily as needed for mild pain (pain score 1-3), moderate pain (pain score 4-6) or headache.    [provider]  ibuprofen  (ADVIL ) 200 MG tablet Take 800 mg by mouth daily as needed for mild pain (pain score 1-3) or moderate pain (pain score 4-6).    [provider]  methadone  (DOLOPHINE ) 10 MG tablet Take 10 mg by mouth 3 (three) times daily. 03/26/23   [provider]  naloxone  (NARCAN ) nasal spray 4 mg/0.1 mL Use as needed for opiod overdose 03/04/22   Paseda, Folashade R, FNP  oxycodone  (ROXICODONE ) 30 MG immediate release tablet Take 30 mg by mouth every 4 (four) hours.    [provider]   promethazine  (PHENERGAN ) 25 MG tablet Take 25 mg by mouth daily as needed for nausea or vomiting.    [provider]  valACYclovir  (VALTREX ) 1000 MG tablet Take 1 tablet by mouth daily. 01/15/23   [provider]    Allergies: Patient has no known allergies.    Review of Systems  All other systems reviewed and are negative.   Updated Vital Signs BP (!) 148/87 (BP Location: Left Arm)   Pulse 97   Temp 98.6 F (37 C) (Oral)   Resp 18   Ht 5' 5 (1.651 m)   Wt (!) 142.9 kg   LMP 08/02/2023 (Approximate)   SpO2 100%   BMI 52.42 kg/m   Physical Exam Vitals and nursing note reviewed.  Constitutional:      General: She is not in acute distress.    Appearance: She is well-developed. She is obese.  HENT:     Head: Normocephalic and atraumatic.  Eyes:     Conjunctiva/sclera: Conjunctivae normal.  Cardiovascular:     Rate and Rhythm: Normal rate and regular rhythm.  Pulmonary:     Effort: Pulmonary effort is normal. No respiratory distress.     Breath sounds: Normal breath sounds.  Abdominal:     Palpations: Abdomen is soft.     Tenderness: There is no abdominal tenderness.  Musculoskeletal:        General: Tenderness present. No swelling.     Cervical back: Neck supple.  Comments: Bilateral lower extremity tenderness  Skin:    General: Skin is warm and dry.     Capillary Refill: Capillary refill takes less than 2 seconds.  Neurological:     Mental Status: She is alert.  Psychiatric:        Mood and Affect: Mood normal.     (all labs ordered are listed, but only abnormal results are displayed) Labs Reviewed  COMPREHENSIVE METABOLIC PANEL WITH GFR  CBC WITH DIFFERENTIAL/PLATELET  RETICULOCYTES  HCG, SERUM, QUALITATIVE    EKG: None  Radiology: No results found.   Procedures   Medications Ordered in the ED  HYDROmorphone  (DILAUDID ) injection 1 mg (has no administration in time range)                                    Medical  Decision Making Amount and/or Complexity of Data Reviewed Labs: ordered.  Risk Prescription drug management.    43 year old female with medical history significant for sickle cell anemia presenting to the emergency department with a chief complaint of pain associated with her sickle cell disease.  The patient states that she has had pain over the past couple of days that has not been amenable to treatment with her home medications of oxycodone  and methadone .  She was seen in the emergency department yesterday for the same complaint and felt a little bit better after treatment in the ER and was subsequently discharged.  She called the sickle cell infusion center this morning due to persistent pain not touched by her home medications, described as located in her bilateral lower extremities and was advised to present to the emergency department because the infusion center was full.  She denies any fevers or chills, cough, chest pain or shortness of breath.  No known sick contacts.  On arrival, the patient was vitally stable.  Presenting with likely persistent sickle cell pain crisis.  Will obtain repeat screening labs as well as obtain IV access and administer IV medications for further management.  Plan to follow-up results of diagnostic testing, reassess the patient following IV pain medications.  Signout given to Dr. Franklyn at [phone] pending results of testing and reassessment.     Final diagnoses:  None    ED Discharge Orders     None          Jerrol Agent, MD 08/10/23 1551

## 2023-08-10 NOTE — Assessment & Plan Note (Signed)
-   will admit per sickle cell protocol,    control pain,    hydrate with IVF D5 .45% Saline @ 100 mls/hour,    Weight based Dilaudid PCA for opioid tolerant patients.      Transfuse as needed if Hg drops significantly below baseline.    No evidence of acute chest at this time   Sickle cell team to take over management in AM

## 2023-08-10 NOTE — ED Triage Notes (Signed)
 Patient to ED by POV for pain due to sickle cell crisis

## 2023-08-10 NOTE — ED Provider Notes (Signed)
 Assumed care of patient from off-going team. For more details, please see note from same day.  In brief, this is a 43 y.o. female with bilateral lower extremity pain she associates with sickle cell disease.  No chest pain or shortness of breath, no fever/chills  Plan/Dispo at time of sign-out & ED Course since sign-out: Pain medicine and reeval  BP 135/75 (BP Location: Right Wrist)   Pulse 80   Temp 98 F (36.7 C) (Oral)   Resp 14   Ht 5' 5 (1.651 m)   Wt (!) 142.9 kg   LMP 08/02/2023 (Approximate)   SpO2 99%   BMI 52.42 kg/m    ED Course:   Patient's labs are unremarkable.  T. bili is not elevated.  Clinical Course as of 08/10/23 2004  Mon Aug 10, 2023  1910 Patient reevaluated.  She states that after 3 doses of Dilaudid  her pain is not significantly improved.  Patient request to be admitted.  Will consult to sickle cell medicine. [HN]    Clinical Course User Index [HN] Franklyn Sid SAILOR, MD    Dispo: Admit to medicine ------------------------------- Sid Franklyn, MD Emergency Medicine  This note was created using dictation software, which may contain spelling or grammatical errors.   Franklyn Sid SAILOR, MD 08/10/23 [phone]

## 2023-08-10 NOTE — Assessment & Plan Note (Signed)
 Bowel regimen

## 2023-08-10 NOTE — H&P (Signed)
 Stephanie Burch FMW:[id number] DOB: 05-Mar-1980 DOA: 08/10/2023     PCP: Cherylene Homer HERO, NP      Patient arrived to ER on 08/10/23 at 1457 Referred by Attending Franklyn Sid SAILOR, MD   Patient coming from:    home Lives  With family      Chief Complaint:   Chief Complaint  Patient presents with   Sickle Cell Pain Crisis    HPI: Stephanie Burch is a 43 y.o. female with medical history significant of sickle cell pain,     Presented with   leg pain  Presents with pain similar to prior sickle cell, not improved with oxycodone  and methadone  Pain in her bilateral legs  No fever or chills No CP no SOB called sickle cell clinic but they were closed  Denies significant ETOH intake   Does not smoke       Regarding pertinent Chronic problems:    last echo  Recent Results (from the past 56199 hours)  ECHOCARDIOGRAM COMPLETE   Collection Time: 11/22/21  2:46 PM  Result Value   BP 134/62   S' Lateral 3.10   AR max vel 3.96   AV Area VTI 4.32   AV Mean grad 5.0   AV Peak grad 9.6   Ao pk vel 1.55   Area-P 1/2 4.06   AV Area mean vel 3.99   Narrative      ECHOCARDIOGRAM REPORT     IMPRESSIONS    1. Left ventricular ejection fraction, by estimation, is 60 to 65%. The left ventricle has normal function. The left ventricle has no regional wall motion abnormalities. Left ventricular diastolic parameters are indeterminate.  2. Right ventricular systolic function is normal. The right ventricular size is normal.  3. The mitral valve is normal in structure. Trivial mitral valve regurgitation.  4. The aortic valve is tricuspid. Aortic valve regurgitation is not visualized. Aortic valve sclerosis/calcification is present, without any evidence of aortic stenosis.         Chronic anemia - baseline hg Hemoglobin & Hematocrit  Recent Labs    08/06/23 0539 08/09/23 1707 08/10/23 1515  HGB 10.4* 10.0* 11.4*   Iron/TIBC/Ferritin/ %Sat    Component Value Date/Time   FERRITIN  338 (H) 11/07/2019 1210   FERRITIN 105 09/11/2017 1431    While in ER: Clinical Course as of 08/10/23 2158  Mon Aug 10, 2023  1910 Patient reevaluated.  She states that after 3 doses of Dilaudid  her pain is not significantly improved.  Patient request to be admitted.  Will consult to sickle cell medicine. [HN]    Clinical Course User Index [HN] Franklyn Sid SAILOR, MD         Lab Orders         Comprehensive metabolic panel         CBC with Differential         Reticulocytes         hCG, serum, qualitative       Following Medications were ordered in ER: Medications  HYDROmorphone  (DILAUDID ) injection 2 mg (has no administration in time range)  ketorolac  (TORADOL ) 15 MG/ML injection 15 mg (15 mg Intravenous Given 08/10/23 1725)  HYDROmorphone  (DILAUDID ) injection 2 mg (2 mg Intravenous Given 08/10/23 1722)  HYDROmorphone  (DILAUDID ) injection 2 mg (2 mg Intravenous Given 08/10/23 1801)  HYDROmorphone  (DILAUDID ) injection 2 mg (2 mg Intravenous Given 08/10/23 1848)  oxyCODONE  (Oxy IR/ROXICODONE ) immediate release tablet 15 mg (15 mg Oral Given 08/10/23 1725)  diphenhydrAMINE  (BENADRYL ) 12.5 mg in sodium chloride  0.9 % 50 mL IVPB (0 mg Intravenous Stopped 08/10/23 1753)  HYDROmorphone  (DILAUDID ) injection 2 mg (2 mg Intravenous Given 08/10/23 2014)        ED Triage Vitals  Encounter Vitals Group     BP 08/10/23 1511 (!) 148/87     Girls Systolic BP Percentile --      Girls Diastolic BP Percentile --      Boys Systolic BP Percentile --      Boys Diastolic BP Percentile --      Pulse Rate 08/10/23 1511 97     Resp 08/10/23 1511 18     Temp 08/10/23 1511 98.6 F (37 C)     Temp Source 08/10/23 1511 Oral     SpO2 08/10/23 1511 100 %     Weight 08/10/23 1512 (!) 315 lb 0.6 oz (142.9 kg)     Height 08/10/23 1512 5' 5 (1.651 m)     Head Circumference --      Peak Flow --      Pain Score 08/10/23 1512 10     Pain Loc --      Pain Education --      Exclude from Growth Chart --   UFJK(75)@      _________________________________________ Significant initial  Findings: Abnormal Labs Reviewed  COMPREHENSIVE METABOLIC PANEL WITH GFR - Abnormal; Notable for the following components:      Result Value   AST 12 (*)    All other components within normal limits  CBC WITH DIFFERENTIAL/PLATELET - Abnormal; Notable for the following components:   Hemoglobin 11.4 (*)    HCT 33.4 (*)    MCV 70.2 (*)    MCH 23.9 (*)    All other components within normal limits  RETICULOCYTES - Abnormal; Notable for the following components:   Immature Retic Fract 26.5 (*)    All other components within normal limits       Cardiac Panel (last 3 results) Recent Labs    08/10/23 1515  CKTOTAL 35*     ECG: Ordered Personally reviewed and interpreted by me showing: HR : 79 Rhythm: Sinus rhythm Borderline T abnormalities, inferior leads QTC 460    The recent clinical data is shown below. Vitals:   08/10/23 1512 08/10/23 1733 08/10/23 1832 08/10/23 1930  BP:  139/86 127/84 135/75  Pulse:  77 79 80  Resp:   18 14  Temp:    98 F (36.7 C)  TempSrc:    Oral  SpO2:  97% 99% 99%  Weight: (!) 142.9 kg     Height: 5' 5 (1.651 m)          WBC     Component Value Date/Time   WBC 7.6 08/10/2023 1515   LYMPHSABS 2.3 08/10/2023 1515   LYMPHSABS 2.2 09/11/2017 1431   MONOABS 0.5 08/10/2023 1515   EOSABS 0.2 08/10/2023 1515   EOSABS 0.1 09/11/2017 1431   BASOSABS 0.0 08/10/2023 1515   BASOSABS 0.0 09/11/2017 1431           __________________________________________________________ Recent Labs  Lab 08/04/23 1045 08/09/23 1707 08/10/23 1515  NA 138 142 139  K 4.1 4.0 4.0  CO2 24 23 24   GLUCOSE 113* 86 82  BUN 6 6 6   CREATININE 0.72 0.54 0.55  CALCIUM 9.1 8.8* 9.1    Cr    stable,   Lab Results  Component Value Date   CREATININE 0.55 08/10/2023   CREATININE 0.54 08/09/2023  CREATININE 0.72 08/04/2023    Recent Labs  Lab 08/04/23 1045 08/10/23 1515  AST 18 12*  ALT 15  12  ALKPHOS 78 80  BILITOT 0.9 0.7  PROT 7.5 7.8  ALBUMIN 3.6 3.6   Lab Results  Component Value Date   CALCIUM 9.1 08/10/2023   PHOS 4.7 (H) 02/12/2019          Plt: Lab Results  Component Value Date   PLT 274 08/10/2023         Recent Labs  Lab 08/04/23 1045 08/05/23 0612 08/06/23 0539 08/09/23 1707 08/10/23 1515  WBC 7.8 7.9 8.4 6.3 7.6  NEUTROABS 4.6  --   --  4.1 4.6  HGB 11.3* 10.7* 10.4* 10.0* 11.4*  HCT 33.0* 31.7* 30.9* 28.5* 33.4*  MCV 71.3* 72.2* 72.4* 69.0* 70.2*  PLT 281 251 245 231 274    HG/HCT * stable,  Down *Up from baseline see below    Component Value Date/Time   HGB 11.4 (L) 08/10/2023 1515   HGB 13.0 09/11/2017 1431   HCT 33.4 (L) 08/10/2023 1515   HCT 38.4 09/11/2017 1431   MCV 70.2 (L) 08/10/2023 1515   MCV 75 (L) 09/11/2017 1431      No results for input(s): LIPASE, AMYLASE in the last 168 hours. No results for input(s): AMMONIA in the last 168 hours.    _______________________________________________ Hospitalist was called for admission for   Sickle cell pain crisis (HCC)      The following Work up has been ordered so far:  Orders Placed This Encounter  Procedures   Comprehensive metabolic panel   CBC with Differential   Reticulocytes   hCG, serum, qualitative   Monitor O2 SATs   Document Actual / Estimated Weight   If O2 sat <94% Administer O2 @ 2 Liters/Minute   Vital signs with O2 sat, q1hour   ED Cardiac monitoring   Weigh patient in Kg   Saline Lock IV-Maintain IV access   Patient may eat/drink   Initiate Carrier Fluid Protocol   Consult to sickle cell medicine Reason for Consult? Sickle Cell Crisis   Consult to hospitalist     OTHER Significant initial  Findings:  labs showing:     DM  labs:  HbA1C: No results for input(s): HGBA1C in the last 8760 hours.     CBG (last 3)  No results for input(s): GLUCAP in the last 72 hours.        Cultures:    Component Value Date/Time   SDES   01/10/2020 1630    URINE, CLEAN CATCH Performed at [facility], [address] [place], [place] [address]    SPECREQUEST  01/10/2020 1630    NONE Performed at [facility], [address], [place], [place] [address]    CULT (A) 01/10/2020 1630    40,000 COLONIES/mL MULTIPLE SPECIES PRESENT, SUGGEST RECOLLECTION   REPTSTATUS 01/13/2020 FINAL 01/10/2020 1630     Radiological Exams on Admission: No results found. _______________________________________________________________________________________________________ Latest  Blood pressure 135/75, pulse 80, temperature 98 F (36.7 C), temperature source Oral, resp. rate 14, height 5' 5 (1.651 m), weight (!) 142.9 kg, last menstrual period 08/02/2023, SpO2 99%.   Vitals  labs and radiology finding personally reviewed  Review of Systems:    Pertinent positives include: leg pain  Constitutional:  No weight loss, night sweats, Fevers, chills, fatigue, weight loss  HEENT:  No headaches, Difficulty swallowing,Tooth/dental problems,Sore throat,  No sneezing, itching, ear ache, nasal congestion,  post nasal drip,  Cardio-vascular:  No chest pain, Orthopnea, PND, anasarca, dizziness, palpitations.no Bilateral lower extremity swelling  GI:  No heartburn, indigestion, abdominal pain, nausea, vomiting, diarrhea, change in bowel habits, loss of appetite, melena, blood in stool, hematemesis Resp:  no shortness of breath at rest. No dyspnea on exertion, No excess mucus, no productive cough, No non-productive cough, No coughing up of blood.No change in color of mucus.No wheezing. Skin:  no rash or lesions. No jaundice GU:  no dysuria, change in color of urine, no urgency or frequency. No straining to urinate.  No flank pain.  Musculoskeletal:  No joint pain or no joint swelling. No decreased range of motion. No back pain.  Psych:  No change in mood or affect. No depression or anxiety. No memory loss.   Neuro: no localizing neurological complaints, no tingling, no weakness, no double vision, no gait abnormality, no slurred speech, no confusion  All systems reviewed and apart from HOPI all are negative _______________________________________________________________________________________________ Past Medical History:   Past Medical History:  Diagnosis Date   Acute kidney injury (HCC) 11/07/2019   Anemia    Chronic pain    COVID-19 virus detected 10/22/2019   Depression    HCAP (healthcare-associated pneumonia) 11/07/2019   History of blood transfusion 2013   x 2   HSV infection    Hypoxia    Pneumonia    x 1   Pulmonary edema 11/11/2019   Sepsis (HCC) 11/07/2019   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 She has never used smokeless tobacco. She reports that she does not currently use alcohol. She reports that she does not use drugs.     Family History:   Family History  Problem Relation Age of Onset   Hypertension Mother    Sickle cell trait Mother    Diabetes Father    Sickle cell trait Father    ______________________________________________________________________________________________ Allergies: No Known Allergies   Prior to Admission medications   Medication Sig Start Date End Date Taking? Authorizing Provider  acetaminophen  (TYLENOL ) 500 MG tablet Take 1,000 mg by mouth daily as needed for mild pain (pain score 1-3), moderate pain (pain score 4-6) or headache.   Yes [provider]  ibuprofen  (ADVIL ) 200 MG tablet Take 800 mg by mouth daily as needed for mild pain (pain score 1-3) or moderate pain (pain score 4-6).   Yes [provider]  methadone  (DOLOPHINE ) 10 MG tablet Take 10 mg by mouth 3 (three) times daily. 03/26/23  Yes [provider]  naloxone  (NARCAN ) nasal spray 4 mg/0.1 mL Use as needed for opiod overdose 03/04/22  Yes Paseda, Folashade R, FNP  oxycodone  (ROXICODONE ) 30 MG immediate release tablet Take 30 mg by mouth every 4 (four) hours.   Yes [provider]  valACYclovir  (VALTREX ) 1000 MG tablet Take 1 tablet by mouth daily. 01/15/23  Yes [provider]    ___________________________________________________________________________________________________ Physical Exam:  08/10/2023    7:30 PM 08/10/2023    6:32 PM 08/10/2023    5:33 PM  Vitals with BMI  Systolic 135 127 860  Diastolic 75 84 86  Pulse 80 79 77     1. General:  in No  Acute distress   Chronically ill  -appearing 2. Psychological: Alert and   Oriented 3. Head/ENT:    Dry Mucous Membranes                          Head Non traumatic, neck supple                          Poor Dentition 4. SKIN: decreased Skin turgor,  Skin clean Dry and intact no rash    5. Heart: Regular rate and rhythm no  Murmur, no Rub or gallop 6. Lungs:  no wheezes or crackles   7. Abdomen: Soft,  non-tender, Non distended   obese  bowel sounds present 8. Lower extremities: no clubbing, cyanosis, no  edema 9. Neurologically Grossly intact, moving all 4 extremities equally   10. MSK: Normal range of motion    Chart has been reviewed  ______________________________________________________________________________________________  Assessment/Plan 43 y.o. female with medical history significant of sickle cell pain,   Admitted for   Sickle cell pain crisis    Present on Admission:  Sickle cell pain crisis (HCC)  Chronic pain syndrome  Constipation due to  pain medication  Sickle cell crisis (HCC)     BMI 50.0-59.9, adult (HCC) Contributing to comorbidity and complicating medical management  Body mass index is 52.42 kg/m.  Nutritional follow up as an out pt would be recommended   Chronic pain syndrome Cont methadone , check qt  Constipation due to pain medication Bowel regimen  Sickle cell crisis (HCC) - will admit per sickle cell protocol,    control pain,    hydrate with IVF D5 .45% Saline @ 100 mls/hour,    Weight based Dilaudid  PCA for opioid tolerant patients.      Transfuse as needed if Hg drops significantly below baseline.    No evidence of acute chest at this time   Sickle cell team to take over management in AM   Other plan as per orders.  DVT prophylaxis:  SCD      Code Status:    Code Status: Prior FULL CODE  as per patient   I had personally discussed CODE STATUS with patient   ACP   none    Family Communication:   Family not at  Bedside    Diet regular   Disposition Plan:    To home once workup is complete and patient is stable   Following barriers for discharge:                                                          Pain controlled with PO medications        Consult Orders  (From admission, onward)           Start     Ordered   08/10/23 2117  Consult to hospitalist  Once       Provider:  (Not yet assigned)  Question Answer Comment  Place call to: [facility]  Reason for Consult Admit      08/10/23 2116                                Consults called:    NONE   Admission status:  ED Disposition     ED Disposition  Admit   Condition  --   Comment  Hospital Area: [facility] [[id number]]  Level of Care: Med-Surg [16]  May admit patient to [facility] or [facility] if equivalent level of care is available:: No  Covid Evaluation: Asymptomatic - no recent exposure (last 10 days) testing not required  Diagnosis: Sickle cell pain crisis [facility])  [[id number]]  Admitting Physician: Rosalynn Sergent [[id number]]  Attending Physician: Jeralyn Nolden [[id number]]  Certification:: I certify this patient will need inpatient services for at least 2 midnights  Expected Medical Readiness: 08/12/2023             inpatient     I Expect 2 midnight stay secondary to severity of patient's current illness need for inpatient interventions justified by the following:     Severe lab/radiological/exam abnormalities including:    Sickle cell pain crisis (HCC)    and extensive comorbidities including:    sickle cell disease    That are currently affecting medical management.   I expect  patient to be hospitalized for 2 midnights requiring inpatient medical care.  Patient is at high risk for adverse outcome (such as loss of life or disability) if not treated.  Indication for inpatient stay as follows:  Severe change from baseline regarding mental status   severe pain requiring acute inpatient management,    Need for  IV fluids,, IV pain medications,     Level of care     medical floor       Blease Quiver 08/10/2023, 10:46 PM    [facility]     after 2 AM please page floor coverage   If 7AM-7PM, please contact the day team taking care of the patient using [email or web address]

## 2023-08-10 NOTE — Progress Notes (Signed)
 Transitional Care Management- Unable to Reach patient   Unable to reach patient after 2 telephone attempt(s).   Letter mailed/sent via mychart to patient and message sent to office staff to contact patient to schedule hospital follow up, if not already done so.  Is this visit eligible for TCM? No   Situation   Stephanie Burch is a 43 y.o. female who was contacted today for a transitional care outreach.  Admission Date:  08/09/2023  Discharge Date:   08/09/2023   Institution: [facility]    Diagnosis:  Sickle Cell Pain crisis  Is this visit eligible for TCM? No  Background   PER MD NOTES:   Primary Care Provider on Record: Zoanne Bennetta Canard, MD

## 2023-08-10 NOTE — Assessment & Plan Note (Signed)
 Cont methadone , check qt

## 2023-08-10 NOTE — Assessment & Plan Note (Signed)
 Contributing to comorbidity and complicating medical management  Body mass index is 52.42 kg/m.  Nutritional follow up as an out pt would be recommended

## 2023-08-10 NOTE — Subjective & Objective (Signed)
 Presents with pain similar to prior sickle cell, not improved with oxycodone  and methadone  Pain in her bilateral legs  No fever or chills No CP no SOB

## 2023-08-10 NOTE — ED Notes (Signed)
 RN attempted to start IV and give pain meds patient refused stating she needs USIV , RN reached out to staff for assistance.

## 2023-08-11 LAB — CBC
HCT: 32.8 % — ABNORMAL LOW (ref 36.0–46.0)
Hemoglobin: 11.4 g/dL — ABNORMAL LOW (ref 12.0–15.0)
MCH: 24.4 pg — ABNORMAL LOW (ref 26.0–34.0)
MCHC: 34.8 g/dL (ref 30.0–36.0)
MCV: 70.1 fL — ABNORMAL LOW (ref 80.0–100.0)
Platelets: 247 K/uL (ref 150–400)
RBC: 4.68 MIL/uL (ref 3.87–5.11)
RDW: 14.8 % (ref 11.5–15.5)
WBC: 7.3 K/uL (ref 4.0–10.5)
nRBC: 0 % (ref 0.0–0.2)

## 2023-08-11 LAB — COMPREHENSIVE METABOLIC PANEL WITH GFR
ALT: 12 U/L (ref 0–44)
AST: 13 U/L — ABNORMAL LOW (ref 15–41)
Albumin: 3.6 g/dL (ref 3.5–5.0)
Alkaline Phosphatase: 74 U/L (ref 38–126)
Anion gap: 7 (ref 5–15)
BUN: 9 mg/dL (ref 6–20)
CO2: 25 mmol/L (ref 22–32)
Calcium: 9 mg/dL (ref 8.9–10.3)
Chloride: 106 mmol/L (ref 98–111)
Creatinine, Ser: 0.6 mg/dL (ref 0.44–1.00)
GFR, Estimated: >60 mL/min (ref >60)
Glucose, Bld: 93 mg/dL (ref 70–99)
Potassium: 3.7 mmol/L (ref 3.5–5.1)
Sodium: 138 mmol/L (ref 135–145)
Total Bilirubin: 1 mg/dL (ref 0.0–1.2)
Total Protein: 7.4 g/dL (ref 6.5–8.1)

## 2023-08-11 LAB — LACTATE DEHYDROGENASE: LDH: 121 U/L (ref 98–192)

## 2023-08-11 MED ORDER — SODIUM CHLORIDE 0.45 % IV SOLN: INTRAVENOUS | Status: DC

## 2023-08-11 MED ORDER — HYDROMORPHONE 1 MG/ML IV SOLN
INTRAVENOUS | Status: DC
  Administered 2023-08-11: 9 mg via INTRAVENOUS
  Administered 2023-08-11: 11 mg via INTRAVENOUS
  Administered 2023-08-11: 3 mg via INTRAVENOUS
  Administered 2023-08-11 (×2): 30 mg via INTRAVENOUS
  Administered 2023-08-11: 6 mg via INTRAVENOUS
  Administered 2023-08-11: 11.5 mg via INTRAVENOUS
  Administered 2023-08-12: 13 mg via INTRAVENOUS
  Administered 2023-08-12: 7.5 mg via INTRAVENOUS
  Administered 2023-08-12: 17 mg via INTRAVENOUS
  Administered 2023-08-12: 12 mg via INTRAVENOUS
  Administered 2023-08-12 (×2): 30 mg via INTRAVENOUS
  Administered 2023-08-12: 7 mg via INTRAVENOUS
  Administered 2023-08-13: 30 mg via INTRAVENOUS
  Administered 2023-08-13: 7.5 mg via INTRAVENOUS
  Administered 2023-08-13: 6.5 mg via INTRAVENOUS
  Administered 2023-08-13: 10 mg via INTRAVENOUS
  Filled 2023-08-11 (×5): qty 30

## 2023-08-11 MED ORDER — OXYCODONE HCL 5 MG PO TABS
30.0000 mg | ORAL_TABLET | ORAL | Status: DC | PRN
  Administered 2023-08-11 – 2023-08-13 (×15): 30 mg via ORAL
  Filled 2023-08-11 (×15): qty 6

## 2023-08-11 NOTE — Progress Notes (Signed)
 This nurse informed during report pt refused IVF because pt stated it blows her up. Prior nurse stated she put pt on KVO and provided education on why. Following report this nurse went to assess pt and noticed IVF pump turned off. This nurse restarted pump and educated pt on why IVF must be administered with PCA pump.   During morning med pass this nurse noticed pts pump turned off again. Re-education was provided to pt. Provider at the bedside and informed. Provider educated pt as well as to why IVF must be administered while on PCA pump. Pt verbalized understanding. After reprogramming pts IVF this nurse locked pts pump so pt will not be able to turn off the pump.

## 2023-08-11 NOTE — Plan of Care (Signed)
   Problem: Nutrition: Goal: Adequate nutrition will be maintained Outcome: Progressing   Problem: Pain Managment: Goal: General experience of comfort will improve and/or be controlled Outcome: Progressing   Problem: Safety: Goal: Ability to remain free from injury will improve Outcome: Progressing

## 2023-08-11 NOTE — Progress Notes (Addendum)
 Patient ID: Stephanie Burch, female   DOB: Jul 22, 1980, 43 y.o.   MRN: [id number] Subjective:  Stephanie Burch is a 43 y.o. female with history of sickle cell disease, chronic pain syndrome, opiate dependence and tolerance, obesity, and anemia of chronic disease presents with complaints of generalized pain, that is consistent with her typical pain crisis. Patient says that pain intensity has been elevated over the past few days and unrelieved by her home medications. Pain is worse in bilateral extremities and back.   Today she is reporting pain of 8/10. No new concerns. She denies cough, headache, N/V/D, no urinary symptoms.  Patient is requesting IV fluid to be turned off.  Nurse from this morning reported that patient turned off her IV fluid and wants Dilaudid  PCA to run without fluids.  Education provided to patient that she is not supposed to manipulate any pump and PCA has to run with fluid.  Objective:  Vital signs in last 24 hours:  Vitals:   08/11/23 0447 08/11/23 0721 08/11/23 0939 08/11/23 0947  BP: 118/75  114/65   Pulse: 82  80   Resp: 19 12 18 16   Temp: 97.6 F (36.4 C)  97.6 F (36.4 C)   TempSrc: Oral  Oral   SpO2: 97%  97%   Weight:      Height:        Intake/Output from previous day:   Intake/Output Summary (Last 24 hours) at 08/11/2023 1046 Last data filed at 08/11/2023 0951 Gross per 24 hour  Intake 1281.44 ml  Output --  Net 1281.44 ml    Physical Exam: General: Alert, awake, oriented x3, in no acute distress.  HEENT: [place]/AT PEERL, EOMI Neck: Trachea midline,  no masses, no thyromegal,y no JVD, no carotid bruit OROPHARYNX:  Moist, No exudate/ erythema/lesions.  Heart: Regular rate and rhythm, without murmurs, rubs, gallops, PMI non-displaced, no heaves or thrills on palpation.  Lungs: Clear to auscultation, no wheezing or rhonchi noted. No increased vocal fremitus resonant to percussion  Abdomen: Soft, nontender, nondistended, positive bowel sounds, no masses no  hepatosplenomegaly noted..  Neuro: No focal neurological deficits noted cranial nerves II through XII grossly intact. DTRs 2+ bilaterally upper and lower extremities. Strength 5 out of 5 in bilateral upper and lower extremities. Musculoskeletal: Generalize body tenderness /bilateral lower extremity tenderness.  Psychiatric: Patient alert and oriented x3, good insight and cognition, good recent to remote recall. Lymph node survey: No cervical axillary or inguinal lymphadenopathy noted.  Lab Results:  Basic Metabolic Panel:    Component Value Date/Time   NA 138 08/11/2023 0646   NA 142 09/11/2017 1431   K 3.7 08/11/2023 0646   CL 106 08/11/2023 0646   CO2 25 08/11/2023 0646   BUN 9 08/11/2023 0646   BUN 8 09/11/2017 1431   CREATININE 0.60 08/11/2023 0646   GLUCOSE 93 08/11/2023 0646   CALCIUM 9.0 08/11/2023 0646   CBC:    Component Value Date/Time   WBC 7.3 08/11/2023 0646   HGB 11.4 (L) 08/11/2023 0646   HGB 13.0 09/11/2017 1431   HCT 32.8 (L) 08/11/2023 0646   HCT 38.4 09/11/2017 1431   PLT 247 08/11/2023 0646   PLT 258 09/11/2017 1431   MCV 70.1 (L) 08/11/2023 0646   MCV 75 (L) 09/11/2017 1431   NEUTROABS 4.6 08/10/2023 1515   NEUTROABS 3.9 09/11/2017 1431   LYMPHSABS 2.3 08/10/2023 1515   LYMPHSABS 2.2 09/11/2017 1431   MONOABS 0.5 08/10/2023 1515   EOSABS 0.2 08/10/2023 1515  EOSABS 0.1 09/11/2017 1431   BASOSABS 0.0 08/10/2023 1515   BASOSABS 0.0 09/11/2017 1431    No results found for this or any previous visit (from the past 240 hours).  Studies/Results: No results found.  Medications: Scheduled Meds:  enoxaparin  (LOVENOX ) injection  40 mg Subcutaneous Q24H   HYDROmorphone    Intravenous Q4H   ketorolac   15 mg Intravenous Q6H   methadone   10 mg Oral TID   senna-docusate  1 tablet Oral BID   valACYclovir   1,000 mg Oral Daily   Continuous Infusions:  sodium chloride      PRN Meds:.bisacodyl , diphenhydrAMINE , hydrOXYzine , naloxone  **AND** sodium  chloride flush, ondansetron  **OR** ondansetron  (ZOFRAN ) IV, oxyCODONE , polyethylene glycol  Consultants: None  Procedures: None  Antibiotics: None  Assessment/Plan: Active Problems:   BMI 50.0-59.9, adult (HCC)   Chronic pain syndrome   Constipation due to pain medication   Sickle cell pain crisis (HCC)   Sickle cell crisis (HCC)   Anemia of chronic disease   Obesity, morbid, 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 Monitor vitals very closely, Re-evaluate pain scale regularly, 2 L of Oxygen  by [place]. Patient encouraged to ambulate on the hallway today.  Leukocytosis: Stable Anemia of Chronic Disease: Hgb 11.4 g/dl, at patients baseline. No transfusion at this time. Will continue to monitor daily CBC.   Chronic pain Syndrome: Continue oral pain medication  Obesity, Morbid, BMI 50 or greater (HCC): Encouraged lifestyle modification, diet and exercise. Out patient weight loss management clinic per PCP referral.  Obesity, morbid, BMI 50 or higher [HCC]: Encouraged lifestyle modification, weight loss, exercise, low-calorie diet.  Referral to weight loss clinic per PCP.   Code Status: Full Code Family Communication: N/A Disposition Plan: Not yet ready for discharge  Homer CHRISTELLA Cover NP   If 7PM-7AM, please contact night-coverage.  08/11/2023, 10:46 AM  LOS: 1 day

## 2023-08-12 LAB — CBC
HCT: 31.5 % — ABNORMAL LOW (ref 36.0–46.0)
Hemoglobin: 10.2 g/dL — ABNORMAL LOW (ref 12.0–15.0)
MCH: 23.3 pg — ABNORMAL LOW (ref 26.0–34.0)
MCHC: 32.4 g/dL (ref 30.0–36.0)
MCV: 72.1 fL — ABNORMAL LOW (ref 80.0–100.0)
Platelets: 218 K/uL (ref 150–400)
RBC: 4.37 MIL/uL (ref 3.87–5.11)
RDW: 14.7 % (ref 11.5–15.5)
WBC: 9.5 K/uL (ref 4.0–10.5)
nRBC: 0.3 % — ABNORMAL HIGH (ref 0.0–0.2)

## 2023-08-12 NOTE — Progress Notes (Signed)
 Patient ID: Stephanie Burch, female   DOB: December 09, 1980, 43 y.o.   MRN: [id number] Subjective:  Stephanie Burch is a 43 y.o. female with history of sickle cell disease, chronic pain syndrome, opiate dependence and tolerance, obesity, and anemia of chronic disease presents with complaints of generalized pain, that is consistent with her typical pain crisis. Patient says that pain intensity has been elevated over the past few days and unrelieved by her home medications. Pain is worse in bilateral extremities and back.   Today she is reporting pain of 7/10. No new concerns. She denies cough, headache, N/V/D, no urinary symptoms.   Objective:  Vital signs in last 24 hours:  Vitals:   08/12/23 0849 08/12/23 0946 08/12/23 1244 08/12/23 1256  BP:  115/73  (!) 96/54  Pulse:  79  82  Resp: 16 16 15 16   Temp:  98.5 F (36.9 C)  97.9 F (36.6 C)  TempSrc:  Oral  Oral  SpO2: 100% 97% 97% 100%  Weight:      Height:        Intake/Output from previous day:   Intake/Output Summary (Last 24 hours) at 08/12/2023 1326 Last data filed at 08/12/2023 0946 Gross per 24 hour  Intake 1527.49 ml  Output --  Net 1527.49 ml    Physical Exam: General: Alert, awake, oriented x3, in no acute distress.  HEENT: [place]/AT PEERL, EOMI Neck: Trachea midline,  no masses, no thyromegal,y no JVD, no carotid bruit OROPHARYNX:  Moist, No exudate/ erythema/lesions.  Heart: Regular rate and rhythm, without murmurs, rubs, gallops, PMI non-displaced, no heaves or thrills on palpation.  Lungs: Clear to auscultation, no wheezing or rhonchi noted. No increased vocal fremitus resonant to percussion  Abdomen: Soft, nontender, nondistended, positive bowel sounds, no masses no hepatosplenomegaly noted..  Neuro: No focal neurological deficits noted cranial nerves II through XII grossly intact. DTRs 2+ bilaterally upper and lower extremities. Strength 5 out of 5 in bilateral upper and lower extremities. Musculoskeletal: Generalize body  tenderness /bilateral lower extremity tenderness.  Psychiatric: Patient alert and oriented x3, good insight and cognition, good recent to remote recall. Lymph node survey: No cervical axillary or inguinal lymphadenopathy noted.  Lab Results:  Basic Metabolic Panel:    Component Value Date/Time   NA 138 08/11/2023 0646   NA 142 09/11/2017 1431   K 3.7 08/11/2023 0646   CL 106 08/11/2023 0646   CO2 25 08/11/2023 0646   BUN 9 08/11/2023 0646   BUN 8 09/11/2017 1431   CREATININE 0.60 08/11/2023 0646   GLUCOSE 93 08/11/2023 0646   CALCIUM 9.0 08/11/2023 0646   CBC:    Component Value Date/Time   WBC 9.5 08/12/2023 1155   HGB 10.2 (L) 08/12/2023 1155   HGB 13.0 09/11/2017 1431   HCT 31.5 (L) 08/12/2023 1155   HCT 38.4 09/11/2017 1431   PLT 218 08/12/2023 1155   PLT 258 09/11/2017 1431   MCV 72.1 (L) 08/12/2023 1155   MCV 75 (L) 09/11/2017 1431   NEUTROABS 4.6 08/10/2023 1515   NEUTROABS 3.9 09/11/2017 1431   LYMPHSABS 2.3 08/10/2023 1515   LYMPHSABS 2.2 09/11/2017 1431   MONOABS 0.5 08/10/2023 1515   EOSABS 0.2 08/10/2023 1515   EOSABS 0.1 09/11/2017 1431   BASOSABS 0.0 08/10/2023 1515   BASOSABS 0.0 09/11/2017 1431    No results found for this or any previous visit (from the past 240 hours).  Studies/Results: No results found.  Medications: Scheduled Meds:  enoxaparin  (LOVENOX ) injection  40  mg Subcutaneous Q24H   HYDROmorphone    Intravenous Q4H   ketorolac   15 mg Intravenous Q6H   methadone   10 mg Oral TID   senna-docusate  1 tablet Oral BID   valACYclovir   1,000 mg Oral Daily   Continuous Infusions:   PRN Meds:.bisacodyl , diphenhydrAMINE , hydrOXYzine , naloxone  **AND** sodium chloride  flush, ondansetron  **OR** ondansetron  (ZOFRAN ) IV, oxyCODONE , polyethylene glycol  Consultants: None  Procedures: None  Antibiotics: None  Assessment/Plan: Active Problems:   BMI 50.0-59.9, adult (HCC)   Chronic pain syndrome   Constipation due to pain medication    Sickle cell pain crisis (HCC)   Sickle cell crisis (HCC)   Anemia of chronic disease   Obesity, morbid, 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 Monitor vitals very closely, Re-evaluate pain scale regularly, 2 L of Oxygen  by [place]. Patient encouraged to ambulate on the hallway today.  Leukocytosis: Stable Anemia of Chronic Disease: Hgb 10.2 g/dl, at patients baseline. No transfusion at this time. Will continue to monitor daily CBC.   Chronic pain Syndrome: Continue oral pain medication  Obesity, Morbid, BMI 50 or greater (HCC): Encouraged lifestyle modification, diet and exercise. Out patient weight loss management clinic per PCP referral.  Obesity, morbid, BMI 50 or higher [HCC]: Encouraged lifestyle modification, weight loss, exercise, low-calorie diet.  Referral to weight loss clinic per PCP.   Code Status: Full Code Family Communication: N/A Disposition Plan: Patient will be ready for discharge in the a.m. Homer CHRISTELLA Cover NP   If 7PM-7AM, please contact night-coverage.  08/12/2023, 1:26 PM  LOS: 2 days

## 2023-08-13 MED ORDER — HYDROMORPHONE 1 MG/ML IV SOLN: INTRAVENOUS | Status: DC

## 2023-08-13 NOTE — Plan of Care (Signed)
  Problem: Education: Goal: Knowledge of vaso-occlusive preventative measures will improve Outcome: Completed/Met Goal: Awareness of infection prevention will improve Outcome: Completed/Met Goal: Awareness of signs and symptoms of anemia will improve Outcome: Completed/Met Goal: Long-term complications will improve Outcome: Completed/Met   Problem: Self-Care: Goal: Ability to incorporate actions that prevent/reduce pain crisis will improve Outcome: Completed/Met   Problem: Bowel/Gastric: Goal: Gut motility will be maintained Outcome: Completed/Met   Problem: Tissue Perfusion: Goal: Complications related to inadequate tissue perfusion will be avoided or minimized Outcome: Completed/Met   Problem: Respiratory: Goal: Pulmonary complications will be avoided or minimized Outcome: Completed/Met Goal: Acute Chest Syndrome will be identified early to prevent complications Outcome: Completed/Met   Problem: Fluid Volume: Goal: Ability to maintain a balanced intake and output will improve Outcome: Completed/Met   Problem: Sensory: Goal: Pain level will decrease with appropriate interventions Outcome: Completed/Met   Problem: Health Behavior: Goal: Postive changes in compliance with treatment and prescription regimens will improve Outcome: Completed/Met   Problem: Education: Goal: Knowledge of General Education information will improve Description: Including pain rating scale, medication(s)/side effects and non-pharmacologic comfort measures Outcome: Completed/Met   Problem: Health Behavior/Discharge Planning: Goal: Ability to manage health-related needs will improve Outcome: Completed/Met   Problem: Clinical Measurements: Goal: Ability to maintain clinical measurements within normal limits will improve Outcome: Completed/Met Goal: Will remain free from infection Outcome: Completed/Met Goal: Diagnostic test results will improve Outcome: Completed/Met Goal: Respiratory  complications will improve Outcome: Completed/Met Goal: Cardiovascular complication will be avoided Outcome: Completed/Met   Problem: Activity: Goal: Risk for activity intolerance will decrease Outcome: Completed/Met   Problem: Nutrition: Goal: Adequate nutrition will be maintained Outcome: Completed/Met   Problem: Coping: Goal: Level of anxiety will decrease Outcome: Completed/Met   Problem: Elimination: Goal: Will not experience complications related to bowel motility Outcome: Completed/Met Goal: Will not experience complications related to urinary retention Outcome: Completed/Met   Problem: Pain Managment: Goal: General experience of comfort will improve and/or be controlled Outcome: Completed/Met   Problem: Safety: Goal: Ability to remain free from injury will improve Outcome: Completed/Met   Problem: Skin Integrity: Goal: Risk for impaired skin integrity will decrease Outcome: Completed/Met

## 2023-08-13 NOTE — Discharge Summary (Signed)
 Physician Discharge Summary  Stephanie Burch FMW:[id number] DOB: May 10, 1980 DOA: 08/10/2023  PCP: Cherylene Homer HERO, NP  Admit date: 08/10/2023  Discharge date: 08/13/2023  Discharge Diagnoses:  Active Problems:   BMI 50.0-59.9, adult (HCC)   Chronic pain syndrome   Constipation due to pain medication   Sickle cell pain crisis (HCC)   Sickle cell crisis (HCC)   Anemia of chronic disease   Obesity, morbid, BMI 50 or higher (HCC)   Discharge Condition: Stable  Disposition:  Pt is discharged home in good condition and is to follow up with Stephanie Clopper M, NP this week to have labs evaluated. Stephanie Burch is instructed to increase activity slowly and balance with rest for the next few days, and use prescribed medication to complete treatment of pain  Diet: Regular Wt Readings from Last 3 Encounters:  08/10/23 (!) 147.4 kg  08/09/23 (!) 142.9 kg  08/04/23 (!) 142.9 kg    History of present illness:  Stephanie Burch  is a 43 y.o. female with history of sickle cell disease, chronic pain syndrome, opiate dependence and tolerance, obesity, and anemia of chronic disease presents with complaints of generalized pain, that is consistent with her typical pain crisis. Patient says that pain intensity has been elevated over the past few days and unrelieved by her home medications. Pain is worse in bilateral extremities and back. Pain precipitated by change in weather and not quite resolved on last admission.  Patient last had oxycodone  this AM without sustained relief. Patient rates pain as 9/10, constant and throbbing. Patient denies fever, chills, chest pain, shortness of breath, nausea, vomiting, or diarrhea. No sick contacts or recent travel.   ED Course: Patient treated with IV fluids, IV pain medication with no changes or resolution to pain symptoms.  She is admitted in-patient for ongoing sickle cell pain management.  BP 135/75 (BP Location: Right Wrist)  Pulse 80  Temp 98 F (36.7 C)  (Oral)  Resp 14  Ht 5' 5 (1.651 Burch)  Wt (!) 142.9 kg  LMP 08/02/2023 (Approximate)  SpO2 99%  BMI 52.42 kg/Burch   Hospital Course:  Patient was admitted for sickle cell pain crisis and managed appropriately with IVF, IV Dilaudid  via PCA and IV Toradol , as well as other adjunct therapies per sickle cell pain management protocols.  Patient is reporting significant improvement to pain today at baseline.  She is able to ambulate without assistance and tolerate p.o. without nausea or vomiting.  Patient asked to be discharged. She was therefore discharged home today in a hemodynamically stable condition.   Stephanie Burch was counseled extensively about nonpharmacologic means of pain management, patient verbalized understanding and was appreciative of  the care received during this admission.   We discussed the need for good hydration, monitoring of hydration status, avoidance of heat, cold, stress, and infection triggers. We discussed the need to be adherent with taking  other home medications. Patient was reminded of the need to seek medical attention immediately if any symptom of bleeding, anemia, or infection occurs.  Discharge Exam: Vitals:   08/13/23 1010 08/13/23 1028  BP: 135/75   Pulse: 78   Resp: 16 16  Temp: 98.4 F (36.9 C)   SpO2: 100%    Vitals:   08/13/23 0717 08/13/23 0939 08/13/23 1010 08/13/23 1028  BP:   135/75   Pulse:   78   Resp: 14 12 16 16   Temp:   98.4 F (36.9 C)   TempSrc:   Oral  SpO2:   100%   Weight:      Height:        General appearance : Awake, alert, not in any distress. Speech Clear. Not toxic looking HEENT: Atraumatic and Normocephalic, pupils equally reactive to light and accomodation Neck: Supple, no JVD. No cervical lymphadenopathy.  Chest: Good air entry bilaterally, no added sounds  CVS: S1 S2 regular, no murmurs.  Abdomen: Bowel sounds present, Non tender and not distended with no gaurding, rigidity or rebound. Extremities: B/L Lower Ext shows no  edema, both legs are warm to touch Neurology: Awake alert, and oriented X 3, CN II-XII intact, Non focal Skin: No Rash  Discharge Instructions  Discharge Instructions     Call MD for:  severe uncontrolled pain   Complete by: As directed    Call MD for:  temperature >100.4   Complete by: As directed    Diet - low sodium heart healthy   Complete by: As directed    Increase activity slowly   Complete by: As directed         The results of significant diagnostics from this hospitalization (including imaging, microbiology, ancillary and laboratory) are listed below for reference.    Significant Diagnostic Studies: DG Chest Port 1 View Result Date: 07/15/2023 CLINICAL DATA:  Sickle cell crisis pain EXAM: PORTABLE CHEST 1 VIEW COMPARISON:  01/31/2022 FINDINGS: Stable cardiomegaly. Prominent pulmonary arteries. Bibasilar atelectasis or infiltrates. No pleural effusion or pneumothorax. No displaced rib fractures. IMPRESSION: Bibasilar atelectasis or infiltrates. Electronically Signed   By: Norman Gatlin Burch.D.   On: 07/15/2023 21:34    Microbiology: No results found for this or any previous visit (from the past 240 hours).   Labs: Basic Metabolic Panel: Recent Labs  Lab 08/09/23 1707 08/10/23 1515 08/11/23 0646  NA 142 139 138  K 4.0 4.0 3.7  CL 108 106 106  CO2 23 24 25   GLUCOSE 86 82 93  BUN 6 6 9   CREATININE 0.54 0.55 0.60  CALCIUM 8.8* 9.1 9.0  MG  --  1.9  --   PHOS  --  3.6  --    Liver Function Tests: Recent Labs  Lab 08/10/23 1515 08/11/23 0646  AST 12* 13*  ALT 12 12  ALKPHOS 80 74  BILITOT 0.7 1.0  PROT 7.8 7.4  ALBUMIN 3.6 3.6   No results for input(s): LIPASE, AMYLASE in the last 168 hours. No results for input(s): AMMONIA in the last 168 hours. CBC: Recent Labs  Lab 08/09/23 1707 08/10/23 1515 08/11/23 0646 08/12/23 1155  WBC 6.3 7.6 7.3 9.5  NEUTROABS 4.1 4.6  --   --   HGB 10.0* 11.4* 11.4* 10.2*  HCT 28.5* 33.4* 32.8* 31.5*  MCV  69.0* 70.2* 70.1* 72.1*  PLT 231 274 247 218   Cardiac Enzymes: Recent Labs  Lab 08/10/23 1515  CKTOTAL 35*   BNP: Invalid input(s): POCBNP CBG: No results for input(s): GLUCAP in the last 168 hours.  Time coordinating discharge: 50 minutes  Signed:  Homer CHRISTELLA Cover NP  [facility] 08/13/2023, 1:12 PM

## 2023-08-13 NOTE — Progress Notes (Signed)
   08/13/23 0851  TOC Brief Assessment  Insurance and Status Reviewed  Patient has primary care physician Yes  Home environment has been reviewed home  Prior level of function: independent  Prior/Current Home Services No current home services  Social Drivers of Health Review SDOH reviewed no interventions necessary  Readmission risk has been reviewed Yes  Transition of care needs no transition of care needs at this time

## 2023-08-31 ENCOUNTER — Non-Acute Institutional Stay (HOSPITAL_COMMUNITY)
Admission: AD | Admit: 2023-08-31 | Discharge: 2023-08-31 | Disposition: A | Discharge status: Home / Self Care | Source: Ambulatory Visit | Attending: Internal Medicine | Admitting: Internal Medicine

## 2023-08-31 ENCOUNTER — Telehealth (HOSPITAL_COMMUNITY): Payer: Self-pay | Admitting: *Deleted

## 2023-08-31 DIAGNOSIS — F1120 Opioid dependence, uncomplicated: Secondary | ICD-10-CM | POA: Insufficient documentation

## 2023-08-31 DIAGNOSIS — D5700 Hb-SS disease with crisis, unspecified: Secondary | ICD-10-CM | POA: Insufficient documentation

## 2023-08-31 DIAGNOSIS — G894 Chronic pain syndrome: Secondary | ICD-10-CM | POA: Insufficient documentation

## 2023-08-31 DIAGNOSIS — D638 Anemia in other chronic diseases classified elsewhere: Secondary | ICD-10-CM | POA: Insufficient documentation

## 2023-08-31 DIAGNOSIS — Z6841 Body Mass Index (BMI) 40.0 and over, adult: Secondary | ICD-10-CM | POA: Insufficient documentation

## 2023-08-31 DIAGNOSIS — E669 Obesity, unspecified: Secondary | ICD-10-CM | POA: Insufficient documentation

## 2023-08-31 LAB — RETICULOCYTES
Immature Retic Fract: 22.8 % — ABNORMAL HIGH (ref 2.3–15.9)
RBC.: 5.05 MIL/uL (ref 3.87–5.11)
Retic Count, Absolute: 170 K/uL (ref 19.0–186.0)
Retic Ct Pct: 3.4 % — ABNORMAL HIGH (ref 0.4–3.1)

## 2023-08-31 LAB — CBC WITH DIFFERENTIAL/PLATELET
Abs Immature Granulocytes: 0.02 K/uL (ref 0.00–0.07)
Basophils Absolute: 0 K/uL (ref 0.0–0.1)
Basophils Relative: 0 %
Eosinophils Absolute: 0.2 K/uL (ref 0.0–0.5)
Eosinophils Relative: 3 %
HCT: 36.1 % (ref 36.0–46.0)
Hemoglobin: 11.9 g/dL — ABNORMAL LOW (ref 12.0–15.0)
Immature Granulocytes: 0 %
Lymphocytes Relative: 29 %
Lymphs Abs: 2.6 K/uL (ref 0.7–4.0)
MCH: 23.5 pg — ABNORMAL LOW (ref 26.0–34.0)
MCHC: 33 g/dL (ref 30.0–36.0)
MCV: 71.3 fL — ABNORMAL LOW (ref 80.0–100.0)
Monocytes Absolute: 0.7 K/uL (ref 0.1–1.0)
Monocytes Relative: 7 %
Neutro Abs: 5.5 K/uL (ref 1.7–7.7)
Neutrophils Relative %: 61 %
Platelets: 283 K/uL (ref 150–400)
RBC: 5.06 MIL/uL (ref 3.87–5.11)
RDW: 15.8 % — ABNORMAL HIGH (ref 11.5–15.5)
WBC: 9.1 K/uL (ref 4.0–10.5)
nRBC: 0.2 % (ref 0.0–0.2)

## 2023-08-31 LAB — COMPREHENSIVE METABOLIC PANEL WITH GFR
ALT: 17 U/L (ref 0–44)
AST: 19 U/L (ref 15–41)
Albumin: 3.5 g/dL (ref 3.5–5.0)
Alkaline Phosphatase: 82 U/L (ref 38–126)
Anion gap: 7 (ref 5–15)
BUN: 8 mg/dL (ref 6–20)
CO2: 28 mmol/L (ref 22–32)
Calcium: 9.5 mg/dL (ref 8.9–10.3)
Chloride: 106 mmol/L (ref 98–111)
Creatinine, Ser: 0.67 mg/dL (ref 0.44–1.00)
GFR, Estimated: >60 mL/min (ref >60)
Glucose, Bld: 123 mg/dL — ABNORMAL HIGH (ref 70–99)
Potassium: 4.3 mmol/L (ref 3.5–5.1)
Sodium: 141 mmol/L (ref 135–145)
Total Bilirubin: 0.7 mg/dL (ref 0.0–1.2)
Total Protein: 7.8 g/dL (ref 6.5–8.1)

## 2023-08-31 LAB — LACTATE DEHYDROGENASE: LDH: 139 U/L (ref 98–192)

## 2023-08-31 MED ORDER — NALOXONE HCL 0.4 MG/ML IJ SOLN: 0.4000 mg | INTRAMUSCULAR | Status: DC | PRN

## 2023-08-31 MED ORDER — SENNOSIDES-DOCUSATE SODIUM 8.6-50 MG PO TABS: 1.0000 | ORAL_TABLET | Freq: Two times a day (BID) | ORAL | Status: DC

## 2023-08-31 MED ORDER — DIPHENHYDRAMINE HCL 25 MG PO CAPS: 25.0000 mg | ORAL_CAPSULE | ORAL | Status: DC | PRN

## 2023-08-31 MED ORDER — ACETAMINOPHEN 500 MG PO TABS
1000.0000 mg | ORAL_TABLET | Freq: Once | ORAL | Status: AC
  Administered 2023-08-31: 1000 mg via ORAL
  Filled 2023-08-31: qty 2

## 2023-08-31 MED ORDER — SODIUM CHLORIDE 0.9% FLUSH: 9.0000 mL | INTRAVENOUS | Status: DC | PRN

## 2023-08-31 MED ORDER — SODIUM CHLORIDE 0.45 % IV SOLN: INTRAVENOUS | Status: DC

## 2023-08-31 MED ORDER — KETOROLAC TROMETHAMINE 15 MG/ML IJ SOLN: 15.0000 mg | Freq: Four times a day (QID) | INTRAMUSCULAR | Status: DC

## 2023-08-31 MED ORDER — HYDROMORPHONE 1 MG/ML IV SOLN
INTRAVENOUS | Status: DC
  Administered 2023-08-31: 30 mg via INTRAVENOUS
  Administered 2023-08-31: 15 mg via INTRAVENOUS
  Filled 2023-08-31: qty 30

## 2023-08-31 MED ORDER — KETOROLAC TROMETHAMINE 15 MG/ML IJ SOLN
15.0000 mg | Freq: Once | INTRAMUSCULAR | Status: AC
  Administered 2023-08-31: 15 mg via INTRAVENOUS
  Filled 2023-08-31: qty 1

## 2023-08-31 MED ORDER — POLYETHYLENE GLYCOL 3350 17 G PO PACK: 17.0000 g | PACK | Freq: Every day | ORAL | Status: DC | PRN

## 2023-08-31 MED ORDER — ONDANSETRON HCL 4 MG/2ML IJ SOLN: 4.0000 mg | Freq: Four times a day (QID) | INTRAMUSCULAR | Status: DC | PRN

## 2023-08-31 NOTE — Telephone Encounter (Signed)
 Patient called requesting to come to the day hospital for sickle cell pain. Patient reports bilateral leg pain rated 9/10. Reports taking Oxycodone  30 mg and Methadone  10 mg at 5:00 am. Denies fever, chest pain, nausea, vomiting, diarrhea and abdominal pain. Admits to having transportation without driving self. Per patient, her son will be her transportation. Homer, NP notified. Patient can come to the day hospital for pain management. Patient advised and expresses an understanding.

## 2023-08-31 NOTE — Progress Notes (Signed)
 Patient admitted to the day hospital for sickle cell pain. Initially, patient reported bilateral leg pain rated 9/10. For pain management, patient placed on Sickle Cell Dose Dilaudid  PCA, given IV Toradol , PO Tylenol  and hydrated with IV fluids. At discharge, patient rated pain at 8/10. Vital signs stable. AVS offered but patient declined. Patient alert, oriented and ambulatory at discharge.

## 2023-08-31 NOTE — H&P (Signed)
 [facility] History and Physical  [name] FMW:[id number] DOB: 01/03/1981 DOA: 08/31/2023  PCP: Cherylene Homer HERO, NP   Chief Complaint:  Chief Complaint  Patient presents with   Sickle Cell Pain Crisis    HPI: Stephanie Burch is a 43 y.o. female with history of sickle cell disease, chronic pain syndrome, opiate dependence and tolerance, obesity, and anemia of chronic disease presents with complaints of generalized pain, bilateral hip and lower extremity pain that is consistent with her typical pain crisis. Patient says that pain intensity has been elevated over the past few days and unrelieved by her home medications.  Patient last had Oxycodone  30 mg and methadone  10 mg this am without sustained relief. Patient rates pain as 9/10, constant and throbbing. Patient denies fever, chills, chest pain, shortness of breath, nausea, vomiting, or diarrhea. No sick contacts or recent travel.   Systemic Review: General: The patient denies anorexia, fever, weight loss Cardiac: Denies chest pain, syncope, palpitations, pedal edema  Respiratory: Denies cough, shortness of breath, wheezing GI: Denies severe indigestion/heartburn, abdominal pain, nausea, vomiting, diarrhea and constipation GU: Denies hematuria, incontinence, dysuria  Musculoskeletal: Bilateral lower extremity, bilateral hip tenderness.   Skin: Denies suspicious skin lesions Neurologic: Denies focal weakness or numbness, change in vision  Past Medical History:  Diagnosis Date   Acute kidney injury (HCC) 11/07/2019   Anemia    Chronic pain    COVID-19 virus detected 10/22/2019   Depression    HCAP (healthcare-associated pneumonia) 11/07/2019   History of blood transfusion 2013   x 2   HSV infection    Hypoxia    Pneumonia    x 1   Pulmonary edema 11/11/2019   Sepsis (HCC) 11/07/2019   Sickle cell anemia (HCC) 09/2021   gets them frequently   Tachycardia with heart rate 100-120 beats per minute    Vitamin B12  deficiency 12/2018   Vitamin D  deficiency     Past Surgical History:  Procedure Laterality Date   CESAREAN SECTION  2013   x 1   TOOTH EXTRACTION N/A 10/18/2021   Procedure: DENTAL RESTORATION/EXTRACTIONS;  Surgeon: Sheryle Hamilton, DMD;  Location: [facility] OR;  Service: Oral Surgery;  Laterality: N/A;    No Known Allergies  Family History  Problem Relation Age of Onset   Hypertension Mother    Sickle cell trait Mother    Diabetes Father    Sickle cell trait Father       Prior to Admission medications   Medication Sig Start Date End Date Taking? Authorizing Provider  ibuprofen  (ADVIL ) 800 MG tablet Take 1 tablet (800 mg total) by mouth every 8 (eight) hours as needed. 12/22/22  Yes Paseda, Folashade R, FNP  methadone  (DOLOPHINE ) 10 MG tablet Take 10 mg by mouth 3 (three) times daily. 03/26/23  Yes [provider]  oxycodone  (ROXICODONE ) 30 MG immediate release tablet Take 30 mg by mouth every 4 (four) hours.   Yes [provider]  valACYclovir  (VALTREX ) 1000 MG tablet Take 1 tablet by mouth daily. 01/15/23  Yes [provider]  acetaminophen  (TYLENOL ) 500 MG tablet Take 1,000 mg by mouth every 6 (six) hours as needed for mild pain, moderate pain or headache.    [provider]  folic acid  (FOLVITE ) 1 MG tablet Take 1 mg by mouth daily.    [provider]  hydroxyurea  (HYDREA ) 500 MG capsule Take 1,500 mg by mouth daily. 02/05/23   [provider]  naloxone  (NARCAN ) nasal spray  4 mg/0.1 mL Use as needed for opiod overdose 03/04/22   Paseda, Folashade R, FNP  ondansetron  (ZOFRAN ) 4 MG tablet Take by mouth. 02/28/22   [provider]  Vitamin D , Ergocalciferol , (DRISDOL ) 1.25 MG (50000 UNIT) CAPS capsule Take 1 capsule (50,000 Units total) by mouth every 7 (seven) days. Patient not taking: Reported on 01/12/2023 03/04/22   Paseda, Folashade R, FNP     Physical Exam: Vitals:   08/31/23 1017 08/31/23 1026 08/31/23 1212  BP: 127/80   117/73  Pulse: (!) 103  94  Resp: 16 16 15   Temp: 98.3 F (36.8 C)    TempSrc: Temporal    SpO2: 95%  95%     General: Alert, awake, afebrile, anicteric, not in obvious distress HEENT: Normocephalic and Atraumatic, Mucous membranes pink                PERRLA; EOM intact; No scleral icterus,                 Nares: Patent, Oropharynx: Clear, Fair Dentition                 Neck: FROM, no cervical lymphadenopathy, thyromegaly, carotid bruit or JVD;  CHEST WALL: No tenderness  CHEST: Normal respiration, clear to auscultation bilaterally  HEART: Regular rate and rhythm; no murmurs rubs or gallops  BACK: No kyphosis or scoliosis; no CVA tenderness  ABDOMEN: Positive Bowel Sounds, soft, non-tender; no masses, no organomegaly EXTREMITIES: No cyanosis, clubbing, or edema SKIN:  no rash or ulceration  CNS: Alert and Oriented x 4, Nonfocal exam, CN 2-12 intact  Labs on Admission:  Basic Metabolic Panel: Recent Labs  Lab 08/31/23 1025  NA 141  K 4.3  CL 106  CO2 28  GLUCOSE 123*  BUN 8  CREATININE 0.67  CALCIUM 9.5   Liver Function Tests: Recent Labs  Lab 08/31/23 1025  AST 19  ALT 17  ALKPHOS 82  BILITOT 0.7  PROT 7.8  ALBUMIN 3.5   No results for input(s): LIPASE, AMYLASE in the last 168 hours. No results for input(s): AMMONIA in the last 168 hours. CBC: Recent Labs  Lab 08/31/23 1025  WBC 9.1  NEUTROABS 5.5  HGB 11.9*  HCT 36.1  MCV 71.3*  PLT 283    Cardiac Enzymes: No results for input(s): CKTOTAL, CKMB, CKMBINDEX, TROPONINI in the last 168 hours.  BNP (last 3 results) No results for input(s): BNP in the last 8760 hours.  ProBNP (last 3 results) No results for input(s): PROBNP in the last 8760 hours.  CBG: No results for input(s): GLUCAP in the last 168 hours.   Assessment/Plan Principal Problem:   Sickle cell anemia with pain (HCC)  Ad[facility] to the [facility] for extended observation IVF D5 .45% Saline @ 125  mls/hour Weight based Dilaudid  PCA started within 30 minutes of admission IV Toradol  15 mg x 1 doses Acetaminophen  1000 mg x 1 dose Labs: CBCD, CMP, Retic Count and LDH Monitor vitals very closely, Re-evaluate pain scale every hour 2 L of Oxygen  by [place] Patient will be re-evaluated for pain in the context of function and relationship to baseline as care progresses. If no significant relieve from pain (remains above 5/10) will transfer patient to inpatient services for further evaluation and management  Code Status: Full  Family Communication: None  DVT Prophylaxis: Ambulate as tolerated   Time spent: 35 Minutes  Homer CHRISTELLA Cover NP  If 7PM-7AM, please contact night-coverage [email or web address] 08/31/2023, 1:41 PM

## 2023-08-31 NOTE — Discharge Summary (Signed)
 Physician Discharge Summary  Stephanie Burch FMW:[id number] DOB: 02-Oct-1980 DOA: 08/31/2023  PCP: Cherylene Stephanie HERO, NP  Admit date: 08/31/2023  Discharge date: 08/31/2023  Time spent: 30 minutes  Discharge Diagnoses:  Principal Problem:   Sickle cell anemia with pain [facility])   Discharge Condition: Stable  Diet recommendation: Regular  History of present illness:   Stephanie Burch is a 43 y.o. female with history of sickle cell disease, chronic pain syndrome, opiate dependence and tolerance, obesity, and anemia of chronic disease presents with complaints of generalized pain, bilateral hip and lower extremity pain that is consistent with her typical pain crisis. Patient says that pain intensity has been elevated over the past few days and unrelieved by her home medications.  Patient last had Oxycodone  30 mg and methadone  10 mg this am without sustained relief. Patient rates pain as 9/10, constant and throbbing. Patient denies fever, chills, chest pain, shortness of breath, nausea, vomiting, or diarrhea. No sick contacts or recent travel.   Hospital Course:  Stephanie Burch was admitted to the day hospital with sickle cell painful crisis. Patient was treated with IV fluid, weight based IV Dilaudid  PCA, IV Toradol , clinician assisted doses as deemed appropriate, and other adjunct therapies per sickle cell pain management protocol. Stephanie Burch showed slight improvement symptomatically, pain improved from 9/10 to 8/10 at the time of discharge. Patient was discharged home in a hemodynamically stable condition. Stephanie Burch will follow-up at the clinic as previously scheduled, continue with home medications as per prior to admission.  Discharge Instructions We discussed the need for good hydration, monitoring of hydration status, avoidance of heat, cold, stress, and infection triggers. We discussed the need to be compliant with taking other home medications. Stephanie Burch was reminded of the need to seek medical  attention immediately if any symptom of bleeding, anemia, or infection occurs.  Discharge Exam: Vitals:   08/31/23 1405 08/31/23 1518  BP: 126/83 126/83  Pulse: 93 90  Resp: 13 14  Temp:    SpO2: 94% 95%    General appearance: alert, cooperative and no distress Eyes: conjunctivae/corneas clear. PERRL, EOM's intact. Fundi benign. Neck: no adenopathy, no carotid bruit, no JVD, supple, symmetrical, trachea midline and thyroid  not enlarged, symmetric, no tenderness/mass/nodules Back: symmetric, no curvature. ROM normal. No CVA tenderness. Resp: clear to auscultation bilaterally Chest wall: no tenderness Cardio: regular rate and rhythm, S1, S2 normal, no murmur, click, rub or gallop GI: soft, non-tender; bowel sounds normal; no masses,  no organomegaly Extremities: extremities normal, atraumatic, no cyanosis or edema Pulses: 2+ and symmetric Skin: Skin color, texture, turgor normal. No rashes or lesions Neurologic: Grossly normal  Discharge Instructions     Call MD for:  severe uncontrolled pain   Complete by: As directed    Call MD for:  temperature >100.4   Complete by: As directed    Diet - low sodium heart healthy   Complete by: As directed    Increase activity slowly   Complete by: As directed       Allergies as of 08/31/2023   No Known Allergies      Medication List     TAKE these medications    acetaminophen  500 MG tablet Commonly known as: TYLENOL  Take 1,000 mg by mouth daily as needed for mild pain (pain score 1-3), moderate pain (pain score 4-6) or headache.   ibuprofen  200 MG tablet Commonly known as: ADVIL  Take 800 mg by mouth daily as needed for mild pain (pain score 1-3) or moderate  pain (pain score 4-6).   methadone  10 MG tablet Commonly known as: DOLOPHINE  Take 10 mg by mouth 3 (three) times daily.   naloxone  4 MG/0.1ML Liqd nasal spray kit Commonly known as: NARCAN  Use as needed for opiod overdose   oxycodone  30 MG immediate release  tablet Commonly known as: ROXICODONE  Take 30 mg by mouth every 4 (four) hours.   valACYclovir  1000 MG tablet Commonly known as: VALTREX  Take 1 tablet by mouth daily.       No Known Allergies   Significant Diagnostic Studies: No results found.  Signed:  Homer Burch Cover NP  08/31/2023, 3:33 PM

## 2023-09-03 ENCOUNTER — Encounter (HOSPITAL_COMMUNITY): Payer: Self-pay

## 2023-09-03 ENCOUNTER — Inpatient Hospital Stay (HOSPITAL_COMMUNITY)
Admission: EM | Admit: 2023-09-03 | Discharge: 2023-09-06 | DRG: 812 | Disposition: A | Discharge status: Home / Self Care | Attending: Internal Medicine | Admitting: Internal Medicine

## 2023-09-03 ENCOUNTER — Other Ambulatory Visit: Payer: Self-pay

## 2023-09-03 DIAGNOSIS — D5700 Hb-SS disease with crisis, unspecified: Secondary | ICD-10-CM | POA: Diagnosis present

## 2023-09-03 DIAGNOSIS — D638 Anemia in other chronic diseases classified elsewhere: Secondary | ICD-10-CM | POA: Diagnosis present

## 2023-09-03 DIAGNOSIS — Z833 Family history of diabetes mellitus: Secondary | ICD-10-CM

## 2023-09-03 DIAGNOSIS — D72829 Elevated white blood cell count, unspecified: Secondary | ICD-10-CM | POA: Diagnosis present

## 2023-09-03 DIAGNOSIS — G894 Chronic pain syndrome: Secondary | ICD-10-CM | POA: Diagnosis present

## 2023-09-03 DIAGNOSIS — Z8249 Family history of ischemic heart disease and other diseases of the circulatory system: Secondary | ICD-10-CM | POA: Diagnosis not present

## 2023-09-03 DIAGNOSIS — Z832 Family history of diseases of the blood and blood-forming organs and certain disorders involving the immune mechanism: Secondary | ICD-10-CM | POA: Diagnosis not present

## 2023-09-03 DIAGNOSIS — Z6841 Body Mass Index (BMI) 40.0 and over, adult: Secondary | ICD-10-CM | POA: Diagnosis not present

## 2023-09-03 DIAGNOSIS — E559 Vitamin D deficiency, unspecified: Secondary | ICD-10-CM | POA: Diagnosis present

## 2023-09-03 DIAGNOSIS — Z79899 Other long term (current) drug therapy: Secondary | ICD-10-CM

## 2023-09-03 LAB — CBC WITH DIFFERENTIAL/PLATELET
Abs Immature Granulocytes: 0.03 K/uL (ref 0.00–0.07)
Basophils Absolute: 0 K/uL (ref 0.0–0.1)
Basophils Relative: 0 %
Eosinophils Absolute: 0.1 K/uL (ref 0.0–0.5)
Eosinophils Relative: 1 %
HCT: 35.9 % — ABNORMAL LOW (ref 36.0–46.0)
Hemoglobin: 12 g/dL (ref 12.0–15.0)
Immature Granulocytes: 0 %
Lymphocytes Relative: 22 %
Lymphs Abs: 2 K/uL (ref 0.7–4.0)
MCH: 23.3 pg — ABNORMAL LOW (ref 26.0–34.0)
MCHC: 33.4 g/dL (ref 30.0–36.0)
MCV: 69.6 fL — ABNORMAL LOW (ref 80.0–100.0)
Monocytes Absolute: 0.6 K/uL (ref 0.1–1.0)
Monocytes Relative: 7 %
Neutro Abs: 6 K/uL (ref 1.7–7.7)
Neutrophils Relative %: 70 %
Platelets: 285 K/uL (ref 150–400)
RBC: 5.16 MIL/uL — ABNORMAL HIGH (ref 3.87–5.11)
RDW: 15.6 % — ABNORMAL HIGH (ref 11.5–15.5)
WBC: 8.7 K/uL (ref 4.0–10.5)
nRBC: 0.2 % (ref 0.0–0.2)

## 2023-09-03 LAB — RETICULOCYTES
Immature Retic Fract: 16.2 % — ABNORMAL HIGH (ref 2.3–15.9)
RBC.: 5.15 MIL/uL — ABNORMAL HIGH (ref 3.87–5.11)
Retic Count, Absolute: 136 K/uL (ref 19.0–186.0)
Retic Ct Pct: 2.6 % (ref 0.4–3.1)

## 2023-09-03 LAB — COMPREHENSIVE METABOLIC PANEL WITH GFR
ALT: 13 U/L (ref 0–44)
AST: 20 U/L (ref 15–41)
Albumin: 3.5 g/dL (ref 3.5–5.0)
Alkaline Phosphatase: 79 U/L (ref 38–126)
Anion gap: 14 (ref 5–15)
BUN: 8 mg/dL (ref 6–20)
CO2: 20 mmol/L — ABNORMAL LOW (ref 22–32)
Calcium: 9.6 mg/dL (ref 8.9–10.3)
Chloride: 104 mmol/L (ref 98–111)
Creatinine, Ser: 0.78 mg/dL (ref 0.44–1.00)
GFR, Estimated: >60 mL/min (ref >60)
Glucose, Bld: 129 mg/dL — ABNORMAL HIGH (ref 70–99)
Potassium: 4.1 mmol/L (ref 3.5–5.1)
Sodium: 138 mmol/L (ref 135–145)
Total Bilirubin: 1 mg/dL (ref 0.0–1.2)
Total Protein: 8 g/dL (ref 6.5–8.1)

## 2023-09-03 MED ORDER — SODIUM CHLORIDE 0.9% FLUSH: 9.0000 mL | INTRAVENOUS | Status: DC | PRN

## 2023-09-03 MED ORDER — HYDROMORPHONE HCL 1 MG/ML IJ SOLN
2.0000 mg | Freq: Once | INTRAMUSCULAR | Status: AC
  Administered 2023-09-03: 2 mg via INTRAMUSCULAR

## 2023-09-03 MED ORDER — NALOXONE HCL 0.4 MG/ML IJ SOLN: 0.4000 mg | INTRAMUSCULAR | Status: DC | PRN

## 2023-09-03 MED ORDER — SENNOSIDES-DOCUSATE SODIUM 8.6-50 MG PO TABS
1.0000 | ORAL_TABLET | Freq: Two times a day (BID) | ORAL | Status: DC
  Filled 2023-09-03 (×5): qty 1

## 2023-09-03 MED ORDER — HYDROMORPHONE HCL 1 MG/ML IJ SOLN
2.0000 mg | INTRAMUSCULAR | Status: DC | PRN
  Administered 2023-09-03: 2 mg via INTRAVENOUS
  Filled 2023-09-03: qty 2

## 2023-09-03 MED ORDER — KETOROLAC TROMETHAMINE 15 MG/ML IJ SOLN
15.0000 mg | Freq: Once | INTRAMUSCULAR | Status: AC
  Administered 2023-09-03: 15 mg via INTRAMUSCULAR
  Filled 2023-09-03: qty 1

## 2023-09-03 MED ORDER — OXYCODONE HCL 5 MG PO TABS
30.0000 mg | ORAL_TABLET | ORAL | Status: DC
  Administered 2023-09-03 – 2023-09-06 (×18): 30 mg via ORAL
  Filled 2023-09-03 (×18): qty 6

## 2023-09-03 MED ORDER — ENOXAPARIN SODIUM 80 MG/0.8ML IJ SOSY
70.0000 mg | PREFILLED_SYRINGE | INTRAMUSCULAR | Status: DC
  Filled 2023-09-03: qty 0.8

## 2023-09-03 MED ORDER — METHADONE HCL 10 MG PO TABS
10.0000 mg | ORAL_TABLET | Freq: Three times a day (TID) | ORAL | Status: DC
  Administered 2023-09-03 – 2023-09-06 (×9): 10 mg via ORAL
  Filled 2023-09-03 (×9): qty 1

## 2023-09-03 MED ORDER — HYDROMORPHONE HCL 1 MG/ML IJ SOLN
2.0000 mg | INTRAMUSCULAR | Status: AC
  Administered 2023-09-03: 2 mg via INTRAVENOUS
  Filled 2023-09-03 (×2): qty 2

## 2023-09-03 MED ORDER — HYDROMORPHONE HCL 1 MG/ML IJ SOLN
2.0000 mg | Freq: Once | INTRAMUSCULAR | Status: AC
  Administered 2023-09-03: 2 mg via INTRAVENOUS

## 2023-09-03 MED ORDER — KETOROLAC TROMETHAMINE 15 MG/ML IJ SOLN
15.0000 mg | Freq: Four times a day (QID) | INTRAMUSCULAR | Status: DC
  Administered 2023-09-03 – 2023-09-06 (×11): 15 mg via INTRAVENOUS
  Filled 2023-09-03 (×11): qty 1

## 2023-09-03 MED ORDER — POLYETHYLENE GLYCOL 3350 17 G PO PACK: 17.0000 g | PACK | Freq: Every day | ORAL | Status: DC | PRN

## 2023-09-03 MED ORDER — ONDANSETRON HCL 4 MG/2ML IJ SOLN: 4.0000 mg | Freq: Four times a day (QID) | INTRAMUSCULAR | Status: DC | PRN

## 2023-09-03 MED ORDER — DIPHENHYDRAMINE HCL 25 MG PO CAPS: 25.0000 mg | ORAL_CAPSULE | ORAL | Status: DC | PRN

## 2023-09-03 MED ORDER — HYDROMORPHONE HCL 1 MG/ML IJ SOLN
2.0000 mg | Freq: Once | INTRAMUSCULAR | Status: DC
  Filled 2023-09-03: qty 2

## 2023-09-03 MED ORDER — SODIUM CHLORIDE 0.45 % IV SOLN: INTRAVENOUS | Status: AC

## 2023-09-03 MED ORDER — IBUPROFEN 800 MG PO TABS: 800.0000 mg | ORAL_TABLET | Freq: Every day | ORAL | Status: DC | PRN

## 2023-09-03 MED ORDER — HYDROMORPHONE 1 MG/ML IV SOLN
INTRAVENOUS | Status: DC
  Administered 2023-09-03: 13.5 mg via INTRAVENOUS
  Administered 2023-09-03 – 2023-09-04 (×2): 30 mg via INTRAVENOUS
  Administered 2023-09-04: 8 mg via INTRAVENOUS
  Administered 2023-09-04: 9.5 mg via INTRAVENOUS
  Administered 2023-09-04: 9 mg via INTRAVENOUS
  Administered 2023-09-04: 9.5 mg via INTRAVENOUS
  Administered 2023-09-04: 10.5 mg via INTRAVENOUS
  Administered 2023-09-05: 7.5 mg via INTRAVENOUS
  Administered 2023-09-05: 9 mg via INTRAVENOUS
  Administered 2023-09-05: 11 mg via INTRAVENOUS
  Administered 2023-09-05 (×2): 30 mg via INTRAVENOUS
  Administered 2023-09-05: 3 mg via INTRAVENOUS
  Administered 2023-09-05: 8 mg via INTRAVENOUS
  Administered 2023-09-06: 9.5 mg via INTRAVENOUS
  Administered 2023-09-06: 30 mg via INTRAVENOUS
  Administered 2023-09-06: 9 mg via INTRAVENOUS
  Administered 2023-09-06: 2.5 mg via INTRAVENOUS
  Administered 2023-09-06: 30 mg via INTRAVENOUS
  Administered 2023-09-06: 6 mg via INTRAVENOUS
  Administered 2023-09-06: 10 mg via INTRAVENOUS
  Filled 2023-09-03 (×6): qty 30

## 2023-09-03 NOTE — ED Notes (Signed)
 [place] PIV placed.

## 2023-09-03 NOTE — Plan of Care (Signed)
  Problem: Education: Goal: Knowledge of General Education information will improve Description: Including pain rating scale, medication(s)/side effects and non-pharmacologic comfort measures Outcome: Progressing   Problem: Pain Managment: Goal: General experience of comfort will improve and/or be controlled Outcome: Progressing   Problem: Education: Goal: Knowledge of vaso-occlusive preventative measures will improve Outcome: Progressing Goal: Awareness of signs and symptoms of anemia will improve Outcome: Progressing   Problem: Self-Care: Goal: Ability to incorporate actions that prevent/reduce pain crisis will improve Outcome: Progressing

## 2023-09-03 NOTE — H&P (Addendum)
 H&P  Patient Demographics:  Stephanie Burch, is a 43 y.o. female  MRN: [id number]   DOB - 07-25-1980  Admit Date - 09/03/2023  Outpatient Primary MD for the patient is No primary care provider on file.  Chief Complaint  Patient presents with   Sickle Cell Pain Crisis      HPI:   Stephanie Burch  is a 43 y.o. female with history of sickle cell disease, chronic pain syndrome, opiate dependence and tolerance, obesity, and anemia of chronic disease presents to the emergency department with complaints of generalized pain, that is consistent with her typical pain crisis. Patient says that pain intensity has been elevated over the past few days and unrelieved by her home medications. Pain is worse in bilateral extremities and back. Patient last had oxycodone  this AM without sustained relief. Patient rates pain as 9/10, constant and throbbing. Patient denies fever, chills, chest pain, shortness of breath, nausea, vomiting, or diarrhea. No sick contacts or recent travel no urinary symptoms.  ED Course:   Patient presented to the ED with uncontrolled pain, she was treated with IVF, IV pain medication with no resolution to symptoms. Patient is admitted for unresolved pain and sickle cell pain management.  BP 124/86   Pulse (!) 101   Temp 98.9 F (37.2 C) (Oral)   Resp 16   Ht 5' 5 (1.651 m)   Wt (!) 142.9 kg   LMP 08/02/2023 (Approximate)   SpO2 97%   BMI 52.42 kg/m      Review of systems:  In addition to the HPI above, patient reports No fever or chills No Headache, No changes with vision or hearing No problems swallowing food or liquids No chest pain, cough or shortness of breath No abdominal pain, No nausea or vomiting, Bowel movements are regular No blood in stool or urine No dysuria No new skin rashes or bruises No new weakness, tingling, numbness in any extremity No recent weight gain or loss No polyuria, polydypsia or polyphagia No significant Mental Stressors  A full 10  point Review of Systems was done, except as stated above, all other Review of Systems were negative.  With Past History of the following :   Past Medical History:  Diagnosis Date   Acute kidney injury (HCC) 11/07/2019   Anemia    Chronic pain    COVID-19 virus detected 10/22/2019   Depression    HCAP (healthcare-associated pneumonia) 11/07/2019   History of blood transfusion 2013   x 2   HSV infection    Hypoxia    Pneumonia    x 1   Pulmonary edema 11/11/2019   Sepsis (HCC) 11/07/2019   Sickle cell anemia (HCC) 09/2021   gets them frequently   Tachycardia with heart rate 100-120 beats per minute    Vitamin B12 deficiency 12/2018   Vitamin D  deficiency       Past Surgical History:  Procedure Laterality Date   CESAREAN SECTION  2013   x 1   TOOTH EXTRACTION N/A 10/18/2021   Procedure: DENTAL RESTORATION/EXTRACTIONS;  Surgeon: Sheryle Hamilton, DMD;  Location: [facility] OR;  Service: Oral Surgery;  Laterality: N/A;     Social History:   Social History   Tobacco Use   Smoking status: Never   Smokeless tobacco: Never  Substance Use Topics   Alcohol use: Not Currently     Lives - At home   Family History :   Family History  Problem Relation Age of Onset   Hypertension Mother  Sickle cell trait Mother    Diabetes Father    Sickle cell trait Father      Home Medications:   Prior to Admission medications   Medication Sig Start Date End Date Taking? Authorizing Provider  acetaminophen  (TYLENOL ) 500 MG tablet Take 1,000 mg by mouth daily as needed for mild pain (pain score 1-3), moderate pain (pain score 4-6) or headache.    [provider]  ibuprofen  (ADVIL ) 200 MG tablet Take 800 mg by mouth daily as needed for mild pain (pain score 1-3) or moderate pain (pain score 4-6).    [provider]  methadone  (DOLOPHINE ) 10 MG tablet Take 10 mg by mouth 3 (three) times daily. 03/26/23   [provider]  naloxone  (NARCAN ) nasal spray 4 mg/0.1 mL Use as  needed for opiod overdose 03/04/22   Paseda, Folashade R, FNP  oxycodone  (ROXICODONE ) 30 MG immediate release tablet Take 30 mg by mouth every 4 (four) hours.    [provider]  valACYclovir  (VALTREX ) 1000 MG tablet Take 1 tablet by mouth daily. 01/15/23   [provider]     Allergies:   No Known Allergies   Physical Exam:   Vitals:   Vitals:   09/04/23 1050 09/04/23 1200  BP: (!) 152/74   Pulse: 93   Resp: 14 14  Temp: 98.1 F (36.7 C)   SpO2: 95% 95%    Physical Exam: Constitutional: Patient appears well-developed and well-nourished. Not in obvious distress. HENT: Normocephalic, atraumatic, External right and left ear normal. Oropharynx is clear and moist.  Eyes: Conjunctivae and EOM are normal. PERRLA, no scleral icterus. Neck: Normal ROM. Neck supple. No JVD. No tracheal deviation. No thyromegaly. CVS: RRR, S1/S2 +, no murmurs, no gallops, no carotid bruit.  Pulmonary: Effort and breath sounds normal, no stridor, rhonchi, wheezes, rales.  Abdominal: Soft. BS +, no distension, tenderness, rebound or guarding.  Musculoskeletal: Bilateral hip and lower extremity tenderness. Lymphadenopathy: No lymphadenopathy noted, cervical, inguinal or axillary Neuro: Alert. Normal reflexes, muscle tone coordination. No cranial nerve deficit. Skin: Skin is warm and dry. No rash noted. Not diaphoretic. No erythema. No pallor. Psychiatric: Normal mood and affect. Behavior, judgment, thought content normal.   Data Review:   CBC Recent Labs  Lab 08/31/23 1025 09/03/23 0955  WBC 9.1 8.7  HGB 11.9* 12.0  HCT 36.1 35.9*  PLT 283 285  MCV 71.3* 69.6*  MCH 23.5* 23.3*  MCHC 33.0 33.4  RDW 15.8* 15.6*  LYMPHSABS 2.6 2.0  MONOABS 0.7 0.6  EOSABS 0.2 0.1  BASOSABS 0.0 0.0   ------------------------------------------------------------------------------------------------------------------  Chemistries  Recent Labs  Lab 08/31/23 1025 09/03/23 0855  NA 141 138  K  4.3 4.1  CL 106 104  CO2 28 20*  GLUCOSE 123* 129*  BUN 8 8  CREATININE 0.67 0.78  CALCIUM 9.5 9.6  AST 19 20  ALT 17 13  ALKPHOS 82 79  BILITOT 0.7 1.0   ------------------------------------------------------------------------------------------------------------------ estimated creatinine clearance is 134.9 mL/min (by C-G formula based on SCr of 0.78 mg/dL). ------------------------------------------------------------------------------------------------------------------ No results for input(s): TSH, T4TOTAL, T3FREE, THYROIDAB in the last 72 hours.  Invalid input(s): FREET3  Coagulation profile No results for input(s): INR, PROTIME in the last 168 hours. ------------------------------------------------------------------------------------------------------------------- No results for input(s): DDIMER in the last 72 hours. -------------------------------------------------------------------------------------------------------------------  Cardiac Enzymes No results for input(s): CKMB, TROPONINI, MYOGLOBIN in the last 168 hours.  Invalid input(s): CK ------------------------------------------------------------------------------------------------------------------    Component Value Date/Time   BNP 17.5 11/21/2021 1403    ---------------------------------------------------------------------------------------------------------------  Urinalysis    Component Value Date/Time   COLORURINE YELLOW 04/19/2023 1905   APPEARANCEUR HAZY (A) 04/19/2023 1905   LABSPEC 1.012 04/19/2023 1905   PHURINE 5.0 04/19/2023 1905   GLUCOSEU NEGATIVE 04/19/2023 1905   HGBUR NEGATIVE 04/19/2023 1905   BILIRUBINUR NEGATIVE 04/19/2023 1905   BILIRUBINUR Negative 04/27/2019 0935   KETONESUR NEGATIVE 04/19/2023 1905   PROTEINUR NEGATIVE 04/19/2023 1905   UROBILINOGEN 1.0 04/27/2019 0935   NITRITE NEGATIVE 04/19/2023 1905   LEUKOCYTESUR NEGATIVE 04/19/2023 1905     ----------------------------------------------------------------------------------------------------------------   Imaging Results:    No results found.   Assessment & Plan:  Principal Problem:   Sickle-cell disease with pain (HCC) Active Problems:   Leukocytosis   Chronic pain syndrome   Anemia of chronic disease   Obesity, morbid, BMI 50 or higher (HCC)   Hb Sickle Cell Disease with crisis: Admit patient, start IVF 0.45% Saline @ 125 mls/hour, start weight based Dilaudid  PCA, start IV Toradol  15 mg Q 6 H, Restart oral home pain medications, Monitor vitals very closely, Re-evaluate pain scale regularly, 2 L of Oxygen  by [place], Patient will be re-evaluated for pain in the context of function and relationship to baseline as care progresses. Leukocytosis: Stable  Anemia of Chronic Disease: Hemoglobin at baseline, will continue to monitor daily CBC.  Chronic pain Syndrome: Continue oral home medication Morbid Obesity: Encouraged, life style changes, diet and weight loss.    DVT Prophylaxis: Subcut Lovenox    AM Labs Ordered, also please review Full Orders  Family Communication: Admission, patient's condition and plan of care including tests being ordered have been discussed with the patient who indicate understanding and agree with the plan and Code Status.  Code Status: Full Code  Consults called: None    Admission status: Inpatient    Time spent in minutes : 50 minutes  Homer CHRISTELLA Cover NP  09/04/2023 at 12:15 PM

## 2023-09-03 NOTE — ED Notes (Signed)
 patient states I knew I should have gone to the sickle cell clinic and this is not helping at all can't I just get a pca pump provider aware

## 2023-09-03 NOTE — ED Triage Notes (Signed)
 Pt reports with [facility] since Sunday. Pt is having bilateral leg pain. Last medication taken for pain last night Oxycodone  30 mg and Methadone  10 mg.

## 2023-09-03 NOTE — Progress Notes (Signed)
   09/03/23 1513  TOC Brief Assessment  Insurance and Status Reviewed  Patient has primary care physician Yes [place], Homer HERO, NP)  Home environment has been reviewed Home  Prior level of function: Independent  Prior/Current Home Services No current home services  Social Drivers of Health Review SDOH reviewed no interventions necessary  Readmission risk has been reviewed Yes  Transition of care needs no transition of care needs at this time

## 2023-09-03 NOTE — ED Notes (Signed)
 Report given to [facility], RN

## 2023-09-03 NOTE — ED Provider Notes (Signed)
 [place] EMERGENCY DEPARTMENT AT [facility] Provider Note   CSN: [id number] Arrival date & time: 09/03/23  9168     Patient presents with: Sickle Cell Pain Crisis   Stephanie Burch is a 43 y.o. female.   HPI Patient presents 2 days after staying for the day at our sickle cell day clinic now with concern of leg pain.  No chest pain, no fever, no nausea, no vomiting she has been taking her medication including methadone  and oxycodone  without relief.  She voices frustration at ongoing pain in spite of this.    Prior to Admission medications   Medication Sig Start Date End Date Taking? Authorizing Provider  acetaminophen  (TYLENOL ) 500 MG tablet Take 1,000 mg by mouth daily as needed for mild pain (pain score 1-3), moderate pain (pain score 4-6) or headache.    [provider]  ibuprofen  (ADVIL ) 200 MG tablet Take 800 mg by mouth daily as needed for mild pain (pain score 1-3) or moderate pain (pain score 4-6).    [provider]  methadone  (DOLOPHINE ) 10 MG tablet Take 10 mg by mouth 3 (three) times daily. 03/26/23   [provider]  naloxone  (NARCAN ) nasal spray 4 mg/0.1 mL Use as needed for opiod overdose 03/04/22   Paseda, Folashade R, FNP  oxycodone  (ROXICODONE ) 30 MG immediate release tablet Take 30 mg by mouth every 4 (four) hours.    [provider]  valACYclovir  (VALTREX ) 1000 MG tablet Take 1 tablet by mouth daily. 01/15/23   [provider]    Allergies: Patient has no known allergies.    Review of Systems  Updated Vital Signs BP 124/86   Pulse (!) 101   Temp 98.9 F (37.2 C) (Oral)   Resp 16   Ht 5' 5 (1.651 m)   Wt (!) 142.9 kg   LMP 08/02/2023 (Approximate)   SpO2 97%   BMI 52.42 kg/m   Physical Exam Vitals and nursing note reviewed.  Constitutional:      General: She is not in acute distress.    Appearance: She is well-developed. She is obese. She is not ill-appearing, toxic-appearing or diaphoretic.   HENT:     Head: Normocephalic and atraumatic.  Eyes:     Conjunctiva/sclera: Conjunctivae normal.  Cardiovascular:     Rate and Rhythm: Regular rhythm. Tachycardia present.  Pulmonary:     Effort: Pulmonary effort is normal. No respiratory distress.     Breath sounds: Normal breath sounds. No stridor.  Abdominal:     General: There is no distension.  Skin:    General: Skin is warm and dry.  Neurological:     Mental Status: She is alert and oriented to person, place, and time.     Cranial Nerves: No cranial nerve deficit.  Psychiatric:        Mood and Affect: Mood normal.     (all labs ordered are listed, but only abnormal results are displayed) Labs Reviewed  COMPREHENSIVE METABOLIC PANEL WITH GFR - Abnormal; Notable for the following components:      Result Value   CO2 20 (*)    Glucose, Bld 129 (*)    All other components within normal limits  CBC WITH DIFFERENTIAL/PLATELET - Abnormal; Notable for the following components:   RBC 5.16 (*)    HCT 35.9 (*)    MCV 69.6 (*)    MCH 23.3 (*)    RDW 15.6 (*)    All other components within normal limits  RETICULOCYTES - Abnormal; Notable for the following components:   RBC. 5.15 (*)    Immature Retic Fract 16.2 (*)    All other components within normal limits  CBC WITH DIFFERENTIAL/PLATELET    EKG: None  Radiology: No results found.   Procedures   Medications Ordered in the ED  HYDROmorphone  (DILAUDID ) injection 2 mg (2 mg Intravenous Not Given 09/03/23 1156)  ketorolac  (TORADOL ) 15 MG/ML injection 15 mg (15 mg Intramuscular Given 09/03/23 1004)  HYDROmorphone  (DILAUDID ) injection 2 mg (2 mg Intravenous Given 09/03/23 1002)  HYDROmorphone  (DILAUDID ) injection 2 mg (2 mg Intramuscular Given 09/03/23 1205)                                    Medical Decision Making Adult female with sickle cell disease presents with leg pain.  Chest distress, absence of hemodynamic instability beyond mild tachycardia all reassuring  for low suspicion of acute chest, though this is a consideration as it is decompensated sickle cell state.  Patient received labs, intramuscular injection, and I discussed her case with our sickle cell team soon after my initial evaluation. Cardiac 105/110 sinus tach abnormal pulse ox 99% room air normal  Amount and/or Complexity of Data Reviewed External Data Reviewed: notes.    Details: Sickle cell day clinic notes from 2 days ago Labs: ordered. Decision-making details documented in ED Course.  Risk Prescription drug management. Decision regarding hospitalization. Diagnosis or treatment significantly limited by social determinants of health.   12:47 PM Labs consistent with multiple prior, no evidence for decompensated sickle cell state, but the patient continues to complain of severe pain in spite of multiple doses of medication here.  Again discussed her case with her sickle cell colleague and the patient was admitted for further pain management and sickle cell pain crisis.   Final diagnoses:  Sickle cell pain crisis [facility])    ED Discharge Orders     None          Garrick Charleston, MD 09/03/23 1248

## 2023-09-03 NOTE — Progress Notes (Signed)
 This nurse notified NP that patient refused Lovenox  and does not want SCD's. Patient educated on the risks and benefits.

## 2023-09-04 NOTE — Plan of Care (Signed)

## 2023-09-04 NOTE — H&P (Signed)
 Erroneous encounter

## 2023-09-04 NOTE — Progress Notes (Signed)
 Patient ID: DELYNDA Burch, female   DOB: 12-18-1980, 43 y.o.   MRN: [id number] Subjective: Stephanie Burch  is a 43 y.o. female with history of sickle cell disease, chronic pain syndrome, opiate dependence and tolerance, obesity, and anemia of chronic disease presents to the emergency department with complaints of generalized pain, that is consistent with her typical pain crisis. Patient says that pain intensity has been elevated over the past few days and unrelieved by her home medications.   Patient is reporting pain 8/10 this morning.  She has no new concerns.  Denies cough, headache, fever, shortness of breath, vomiting, nausea, diarrhea.  No urinary symptoms.  Objective:  Vital signs in last 24 hours:  Vitals:   09/04/23 0417 09/04/23 0723 09/04/23 1050 09/04/23 1200  BP: 113/75  (!) 152/74   Pulse: (!) 101  93   Resp: 16 14 14 14   Temp: 98.2 F (36.8 C)  98.1 F (36.7 C)   TempSrc: Oral  Oral   SpO2: 95% 97% 95% 95%  Weight:      Height:        Intake/Output from previous day:   Intake/Output Summary (Last 24 hours) at 09/04/2023 1226 Last data filed at 09/04/2023 0600 Gross per 24 hour  Intake 1936.02 ml  Output --  Net 1936.02 ml    Physical Exam: General: Alert, awake, oriented x3, in no acute distress.  HEENT: [place]/AT PEERL, EOMI Neck: Trachea midline,  no masses, no thyromegal,y no JVD, no carotid bruit OROPHARYNX:  Moist, No exudate/ erythema/lesions.  Heart: Regular rate and rhythm, without murmurs, rubs, gallops, PMI non-displaced, no heaves or thrills on palpation.  Lungs: Clear to auscultation, no wheezing or rhonchi noted. No increased vocal fremitus resonant to percussion  Abdomen: Soft, nontender, nondistended, positive bowel sounds, no masses no hepatosplenomegaly noted..  Neuro: No focal neurological deficits noted cranial nerves II through XII grossly intact. DTRs 2+ bilaterally upper and lower extremities. Strength 5 out of 5 in bilateral upper and lower  extremities. Musculoskeletal: Generalized body tenderness.  Psychiatric: Patient alert and oriented x3, good insight and cognition, good recent to remote recall. Lymph node survey: No cervical axillary or inguinal lymphadenopathy noted.  Lab Results:  Basic Metabolic Panel:    Component Value Date/Time   NA 138 09/03/2023 0855   NA 142 09/11/2017 1431   K 4.1 09/03/2023 0855   CL 104 09/03/2023 0855   CO2 20 (L) 09/03/2023 0855   BUN 8 09/03/2023 0855   BUN 8 09/11/2017 1431   CREATININE 0.78 09/03/2023 0855   GLUCOSE 129 (H) 09/03/2023 0855   CALCIUM 9.6 09/03/2023 0855   CBC:    Component Value Date/Time   WBC 8.7 09/03/2023 0955   HGB 12.0 09/03/2023 0955   HGB 13.0 09/11/2017 1431   HCT 35.9 (L) 09/03/2023 0955   HCT 38.4 09/11/2017 1431   PLT 285 09/03/2023 0955   PLT 258 09/11/2017 1431   MCV 69.6 (L) 09/03/2023 0955   MCV 75 (L) 09/11/2017 1431   NEUTROABS 6.0 09/03/2023 0955   NEUTROABS 3.9 09/11/2017 1431   LYMPHSABS 2.0 09/03/2023 0955   LYMPHSABS 2.2 09/11/2017 1431   MONOABS 0.6 09/03/2023 0955   EOSABS 0.1 09/03/2023 0955   EOSABS 0.1 09/11/2017 1431   BASOSABS 0.0 09/03/2023 0955   BASOSABS 0.0 09/11/2017 1431    No results found for this or any previous visit (from the past 240 hours).  Studies/Results: No results found.  Medications: Scheduled Meds:  enoxaparin  (LOVENOX ) injection  70 mg Subcutaneous Q24H   HYDROmorphone    Intravenous Q4H   ketorolac   15 mg Intravenous Q6H   methadone   10 mg Oral TID   oxycodone   30 mg Oral Q4H   senna-docusate  1 tablet Oral BID   Continuous Infusions:  sodium chloride  125 mL/hr at 09/03/23 1506   PRN Meds:.diphenhydrAMINE , naloxone  **AND** sodium chloride  flush, ondansetron  (ZOFRAN ) IV, polyethylene glycol  Consultants: None  Procedures: None  Antibiotics: None  Assessment/Plan: Principal Problem:   Sickle-cell disease with pain (HCC) Active Problems:   Leukocytosis   Chronic pain  syndrome   Anemia of chronic disease   Obesity, morbid, BMI 50 or higher (HCC)   Hb Sickle Cell Disease with Pain crisis: Continue IVF 0.45% Saline @KVO , continue weight based Dilaudid  PCA, IV Toradol  15 mg Q 6 H for a total of 5 days, continue oral home pain medications as ordered. Monitor vitals very closely, Re-evaluate pain scale regularly, 2 L of Oxygen  by [place]. Patient encouraged to ambulate on the hallway today.  Leukocytosis: Stable Anemia of Chronic Disease: Within patient's baseline.  Will continue to monitor daily CBC. Chronic pain Syndrome: Continue oral home pain medication.   Code Status: Full Code Family Communication: N/A Disposition Plan: Not yet ready for discharge  Homer CHRISTELLA Cover  If 7PM-7AM, please contact night-coverage.  09/04/2023, 12:26 PM  LOS: 1 day

## 2023-09-05 DIAGNOSIS — D5700 Hb-SS disease with crisis, unspecified: Secondary | ICD-10-CM | POA: Diagnosis not present

## 2023-09-05 NOTE — Plan of Care (Signed)

## 2023-09-05 NOTE — Progress Notes (Signed)
 Patient ID: Stephanie Burch, female   DOB: 07-Oct-1980, 43 y.o.   MRN: [id number] Subjective: Stephanie Burch  is a 43 y.o. female with history of sickle cell disease, chronic pain syndrome, opiate dependence and tolerance, obesity, and anemia of chronic disease presents to the emergency department with complaints of generalized pain, that is consistent with her typical pain crisis. Patient was admitted for sickle cell crisis.   Patient has no new complaint today but she continues to endorse significant pain especially in her lower extremities and lower back.  She said her pain is about 7/10.  She is requesting 1 more night in the hospital.  She denies cough, headache, fever, shortness of breath, vomiting, nausea, diarrhea.  No urinary symptoms.  Objective:  Vital signs in last 24 hours:  Vitals:   09/05/23 0735 09/05/23 1135 09/05/23 1352 09/05/23 1618  BP:   134/80   Pulse:   (!) 102   Resp: 20 14 14 19   Temp:   98.4 F (36.9 C)   TempSrc:   Oral   SpO2: 97% 96% 96% 97%  Weight:      Height:        Intake/Output from previous day:   Intake/Output Summary (Last 24 hours) at 09/05/2023 1913 Last data filed at 09/05/2023 1330 Gross per 24 hour  Intake 480 ml  Output --  Net 480 ml    Physical Exam: General: Alert, awake, oriented x3, in no acute distress.  Morbidly obese HEENT: Stephanie Burch/AT PEERL, EOMI Neck: Trachea midline,  no masses, no thyromegal,y no JVD, no carotid bruit OROPHARYNX:  Moist, No exudate/ erythema/lesions.  Heart: Regular rate and rhythm, without murmurs, rubs, gallops, PMI non-displaced, no heaves or thrills on palpation.  Lungs: Clear to auscultation, no wheezing or rhonchi noted. No increased vocal fremitus resonant to percussion  Abdomen: Soft, nontender, nondistended, positive bowel sounds, no masses no hepatosplenomegaly noted..  Neuro: No focal neurological deficits noted cranial nerves II through XII grossly intact. DTRs 2+ bilaterally upper and lower  extremities. Strength 5 out of 5 in bilateral upper and lower extremities. Musculoskeletal: Generalized body tenderness.  Psychiatric: Patient alert and oriented x3, good insight and cognition, good recent to remote recall. Lymph node survey: No cervical axillary or inguinal lymphadenopathy noted.  Lab Results:  Basic Metabolic Panel:    Component Value Date/Time   NA 138 09/03/2023 0855   NA 142 09/11/2017 1431   K 4.1 09/03/2023 0855   CL 104 09/03/2023 0855   CO2 20 (L) 09/03/2023 0855   BUN 8 09/03/2023 0855   BUN 8 09/11/2017 1431   CREATININE 0.78 09/03/2023 0855   GLUCOSE 129 (H) 09/03/2023 0855   CALCIUM 9.6 09/03/2023 0855   CBC:    Component Value Date/Time   WBC 8.7 09/03/2023 0955   HGB 12.0 09/03/2023 0955   HGB 13.0 09/11/2017 1431   HCT 35.9 (L) 09/03/2023 0955   HCT 38.4 09/11/2017 1431   PLT 285 09/03/2023 0955   PLT 258 09/11/2017 1431   MCV 69.6 (L) 09/03/2023 0955   MCV 75 (L) 09/11/2017 1431   NEUTROABS 6.0 09/03/2023 0955   NEUTROABS 3.9 09/11/2017 1431   LYMPHSABS 2.0 09/03/2023 0955   LYMPHSABS 2.2 09/11/2017 1431   MONOABS 0.6 09/03/2023 0955   EOSABS 0.1 09/03/2023 0955   EOSABS 0.1 09/11/2017 1431   BASOSABS 0.0 09/03/2023 0955   BASOSABS 0.0 09/11/2017 1431    No results found for this or any previous visit (from the past 240 hours).  Studies/Results: No results found.  Medications: Scheduled Meds:  enoxaparin  (LOVENOX ) injection  70 mg Subcutaneous Q24H   HYDROmorphone    Intravenous Q4H   ketorolac   15 mg Intravenous Q6H   methadone   10 mg Oral TID   oxycodone   30 mg Oral Q4H   senna-docusate  1 tablet Oral BID   Continuous Infusions:   PRN Meds:.diphenhydrAMINE , naloxone  **AND** sodium chloride  flush, ondansetron  (ZOFRAN ) IV, polyethylene glycol  Consultants: None  Procedures: None  Antibiotics: None  Assessment/Plan: Principal Problem:   Sickle-cell disease with pain (HCC) Active Problems:   Leukocytosis    Chronic pain syndrome   Anemia of chronic disease   Obesity, morbid, BMI 50 or higher (HCC)  Hb Sickle Cell Disease with Pain crisis: IV fluid at KVO, continue weight based Dilaudid  PCA at current dose setting, continue IV Toradol  15 mg Q 6 H for a total of 5 days, continue oral home pain medications as ordered. Monitor vitals very closely, Re-evaluate pain scale regularly, 2 L of Oxygen  by [place]. Patient encouraged to ambulate on the hallway today.  Leukocytosis: Stable.  Continue to monitor without antibiotics. Anemia of Chronic Disease: Hemoglobin is stable at baseline today.  There is no clinical indication for blood transfusion at this time.  Will continue to monitor and transfuse as appropriate.  Check lab in AM. Chronic pain Syndrome: Continue oral home pain medication. Morbid obesity: Patient was counseled extensively about lifestyle modification, gentle exercise and weight loss.  Code Status: Full Code Family Communication: N/A Disposition Plan: Possible discharge home tomorrow morning 09/06/2023.  Doran Nestle  If 7PM-7AM, please contact night-coverage.  09/05/2023, 7:13 PM  LOS: 2 days

## 2023-09-05 NOTE — Plan of Care (Signed)
   Problem: Education: Goal: Knowledge of General Education information will improve Description: Including pain rating scale, medication(s)/side effects and non-pharmacologic comfort measures Outcome: Progressing   Problem: Activity: Goal: Risk for activity intolerance will decrease Outcome: Progressing   Problem: Nutrition: Goal: Adequate nutrition will be maintained Outcome: Progressing   Problem: Coping: Goal: Level of anxiety will decrease Outcome: Progressing

## 2023-09-06 DIAGNOSIS — D5700 Hb-SS disease with crisis, unspecified: Secondary | ICD-10-CM | POA: Diagnosis not present

## 2023-09-06 LAB — COMPREHENSIVE METABOLIC PANEL WITH GFR
ALT: 17 U/L (ref 0–44)
AST: 17 U/L (ref 15–41)
Albumin: 3.4 g/dL — ABNORMAL LOW (ref 3.5–5.0)
Alkaline Phosphatase: 77 U/L (ref 38–126)
Anion gap: 9 (ref 5–15)
BUN: 12 mg/dL (ref 6–20)
CO2: 26 mmol/L (ref 22–32)
Calcium: 9.1 mg/dL (ref 8.9–10.3)
Chloride: 104 mmol/L (ref 98–111)
Creatinine, Ser: 0.69 mg/dL (ref 0.44–1.00)
GFR, Estimated: >60 mL/min (ref >60)
Glucose, Bld: 96 mg/dL (ref 70–99)
Potassium: 4.1 mmol/L (ref 3.5–5.1)
Sodium: 139 mmol/L (ref 135–145)
Total Bilirubin: 0.9 mg/dL (ref 0.0–1.2)
Total Protein: 7.5 g/dL (ref 6.5–8.1)

## 2023-09-06 LAB — CBC WITH DIFFERENTIAL/PLATELET
Abs Immature Granulocytes: 0.02 K/uL (ref 0.00–0.07)
Basophils Absolute: 0 K/uL (ref 0.0–0.1)
Basophils Relative: 1 %
Eosinophils Absolute: 0.4 K/uL (ref 0.0–0.5)
Eosinophils Relative: 4 %
HCT: 33 % — ABNORMAL LOW (ref 36.0–46.0)
Hemoglobin: 10.8 g/dL — ABNORMAL LOW (ref 12.0–15.0)
Immature Granulocytes: 0 %
Lymphocytes Relative: 30 %
Lymphs Abs: 2.7 K/uL (ref 0.7–4.0)
MCH: 23.3 pg — ABNORMAL LOW (ref 26.0–34.0)
MCHC: 32.7 g/dL (ref 30.0–36.0)
MCV: 71.3 fL — ABNORMAL LOW (ref 80.0–100.0)
Monocytes Absolute: 0.7 K/uL (ref 0.1–1.0)
Monocytes Relative: 8 %
Neutro Abs: 5.1 K/uL (ref 1.7–7.7)
Neutrophils Relative %: 57 %
Platelets: 226 K/uL (ref 150–400)
RBC: 4.63 MIL/uL (ref 3.87–5.11)
RDW: 15.4 % (ref 11.5–15.5)
WBC: 8.9 K/uL (ref 4.0–10.5)
nRBC: 0.2 % (ref 0.0–0.2)

## 2023-09-06 NOTE — Plan of Care (Signed)

## 2023-09-06 NOTE — Discharge Summary (Signed)
 Physician Discharge Summary  Stephanie Burch FMW:[id number] DOB: 05/30/1980 DOA: 09/03/2023  PCP: No primary care provider on file.  Admit date: 09/03/2023  Discharge date: 09/06/2023  Discharge Diagnoses:  Principal Problem:   Sickle-cell disease with pain (HCC) Active Problems:   Leukocytosis   Chronic pain syndrome   Anemia of chronic disease   Obesity, morbid, BMI 50 or higher (HCC)   Discharge Condition: Stable  Disposition:  Pt is discharged home in good condition and is to follow up with No primary care provider on file. this week to have labs evaluated. Stephanie Burch is instructed to increase activity slowly and balance with rest for the next few days, and use prescribed medication to complete treatment of pain  Diet: Regular Wt Readings from Last 3 Encounters:  09/03/23 (!) 142.9 kg  08/10/23 (!) 147.4 kg  08/09/23 (!) 142.9 kg    History of present illness:  Stephanie Burch  is a 43 y.o. female with history of sickle cell disease, chronic pain syndrome, opiate dependence and tolerance, obesity, and anemia of chronic disease presents to the emergency department with complaints of generalized pain, that is consistent with her typical pain crisis. Patient says that pain intensity has been elevated over the past few days and unrelieved by her home medications. Pain is worse in bilateral extremities and back. Patient last had oxycodone  this AM without sustained relief. Patient rates pain as 9/10, constant and throbbing. Patient denies fever, chills, chest pain, shortness of breath, nausea, vomiting, or diarrhea. No sick contacts or recent travel no urinary symptoms.   ED Course:    Patient presented to the ED with uncontrolled pain, she was treated with IVF, IV pain medication with no resolution to symptoms. Patient is admitted for unresolved pain and sickle cell pain management.  BP 124/86   Pulse (!) 101   Temp 98.9 F (37.2 C) (Oral)   Resp 16   Ht 5' 5 (1.651 m)   Wt  (!) 142.9 kg   LMP 08/02/2023 (Approximate)   SpO2 97%   BMI 52.42 kg/m   Hospital Course:  Patient was admitted for sickle cell pain crisis and managed appropriately with IVF, IV Dilaudid  via PCA and IV Toradol , as well as other adjunct therapies per sickle cell pain management protocols.  Patient did well on this regimen.  Her pain slowly returned to baseline.  Hemoglobin remained stable at baseline throughout this admission.  She did not require any blood transfusion.  Her sats today, patient is eating and drinking well with no significant restrictions, ambulating well with no significant pain.  Patient requested to be discharged home.  Patient was therefore discharged home today in a hemodynamically stable condition.   Stephanie Burch will follow-up with PCP within 1 week of this discharge. Stephanie Burch was counseled extensively about nonpharmacologic means of pain management, patient verbalized understanding and was appreciative of  the care received during this admission.   We discussed the need for good hydration, monitoring of hydration status, avoidance of heat, cold, stress, and infection triggers. We discussed the need to be adherent with taking Hydrea  and other home medications. Patient was reminded of the need to seek medical attention immediately if any symptom of bleeding, anemia, or infection occurs.  Discharge Exam: Vitals:   09/06/23 0953 09/06/23 1230  BP: 134/82   Pulse: 86   Resp: 15 18  Temp: 98.2 F (36.8 C)   SpO2: 99%    Vitals:   09/06/23 0832 09/06/23 0835 09/06/23 0953 09/06/23  1230  BP:   134/82   Pulse:   86   Resp: 18 18 15 18   Temp:   98.2 F (36.8 C)   TempSrc:   Oral   SpO2:   99%   Weight:      Height:        General appearance : Awake, alert, not in any distress. Speech Clear. Not toxic looking HEENT: Atraumatic and Normocephalic, pupils equally reactive to light and accomodation Neck: Supple, no JVD. No cervical lymphadenopathy.  Chest: Good air entry  bilaterally, no added sounds  CVS: S1 S2 regular, no murmurs.  Abdomen: Bowel sounds present, Non tender and not distended with no gaurding, rigidity or rebound. Extremities: B/L Lower Ext shows no edema, both legs are warm to touch Neurology: Awake alert, and oriented X 3, CN II-XII intact, Non focal Skin: No Rash  Discharge Instructions  Discharge Instructions     Diet - low sodium heart healthy   Complete by: As directed    Increase activity slowly   Complete by: As directed       Allergies as of 09/06/2023   No Known Allergies      Medication List     TAKE these medications    acetaminophen  500 MG tablet Commonly known as: TYLENOL  Take 1,000 mg by mouth every 6 (six) hours as needed for mild pain (pain score 1-3) or headache.   ibuprofen  200 MG tablet Commonly known as: ADVIL  Take 800 mg by mouth every 6 (six) hours as needed for mild pain (pain score 1-3) or headache.   methadone  10 MG tablet Commonly known as: DOLOPHINE  Take 10-20 mg by mouth See admin instructions. Take 20 mg by mouth in the morning and 10 mg midday & at bedtime   naloxone  4 MG/0.1ML Liqd nasal spray kit Commonly known as: NARCAN  Use as needed for opiod overdose   oxycodone  30 MG immediate release tablet Commonly known as: ROXICODONE  Take 30 mg by mouth 6 (six) times daily.   promethazine  25 MG tablet Commonly known as: PHENERGAN  Take 25 mg by mouth every 8 (eight) hours as needed for nausea or vomiting.   valACYclovir  1000 MG tablet Commonly known as: VALTREX  Take 1,000 mg by mouth daily.        The results of significant diagnostics from this hospitalization (including imaging, microbiology, ancillary and laboratory) are listed below for reference.    Significant Diagnostic Studies: No results found.  Microbiology: No results found for this or any previous visit (from the past 240 hours).   Labs: Basic Metabolic Panel: Recent Labs  Lab 08/31/23 1025 09/03/23 0855  09/06/23 0712  NA 141 138 139  K 4.3 4.1 4.1  CL 106 104 104  CO2 28 20* 26  GLUCOSE 123* 129* 96  BUN 8 8 12   CREATININE 0.67 0.78 0.69  CALCIUM 9.5 9.6 9.1   Liver Function Tests: Recent Labs  Lab 08/31/23 1025 09/03/23 0855 09/06/23 0712  AST 19 20 17   ALT 17 13 17   ALKPHOS 82 79 77  BILITOT 0.7 1.0 0.9  PROT 7.8 8.0 7.5  ALBUMIN 3.5 3.5 3.4*   No results for input(s): LIPASE, AMYLASE in the last 168 hours. No results for input(s): AMMONIA in the last 168 hours. CBC: Recent Labs  Lab 08/31/23 1025 09/03/23 0955 09/06/23 0712  WBC 9.1 8.7 8.9  NEUTROABS 5.5 6.0 5.1  HGB 11.9* 12.0 10.8*  HCT 36.1 35.9* 33.0*  MCV 71.3* 69.6* 71.3*  PLT 283 285  226   Cardiac Enzymes: No results for input(s): CKTOTAL, CKMB, CKMBINDEX, TROPONINI in the last 168 hours. BNP: Invalid input(s): POCBNP CBG: No results for input(s): GLUCAP in the last 168 hours.  Time coordinating discharge: 50 minutes  Signed:  Kenna Seward  [facility] 09/06/2023, 1:40 PM

## 2023-09-06 NOTE — Progress Notes (Signed)
 Discharge instructions gone over with [facility]. No prescriptions called in to pharmacy. Jaydan arranged to have an [facility] pick her up and take her home. She was discharged via wheelchair to a bench out front to wait for her uber.

## 2023-09-06 NOTE — Plan of Care (Signed)
  Problem: Education: Goal: Knowledge of General Education information will improve Description: Including pain rating scale, medication(s)/side effects and non-pharmacologic comfort measures Outcome: Adequate for Discharge   Problem: Health Behavior/Discharge Planning: Goal: Ability to manage health-related needs will improve Outcome: Adequate for Discharge   Problem: Clinical Measurements: Goal: Ability to maintain clinical measurements within normal limits will improve Outcome: Adequate for Discharge Goal: Will remain free from infection Outcome: Adequate for Discharge Goal: Diagnostic test results will improve Outcome: Adequate for Discharge Goal: Respiratory complications will improve Outcome: Adequate for Discharge Goal: Cardiovascular complication will be avoided Outcome: Adequate for Discharge   Problem: Activity: Goal: Risk for activity intolerance will decrease Outcome: Adequate for Discharge   Problem: Nutrition: Goal: Adequate nutrition will be maintained Outcome: Adequate for Discharge   Problem: Coping: Goal: Level of anxiety will decrease Outcome: Adequate for Discharge   Problem: Elimination: Goal: Will not experience complications related to bowel motility Outcome: Adequate for Discharge Goal: Will not experience complications related to urinary retention Outcome: Adequate for Discharge   Problem: Pain Managment: Goal: General experience of comfort will improve and/or be controlled Outcome: Adequate for Discharge   Problem: Safety: Goal: Ability to remain free from injury will improve Outcome: Adequate for Discharge   Problem: Skin Integrity: Goal: Risk for impaired skin integrity will decrease Outcome: Adequate for Discharge   Problem: Education: Goal: Knowledge of vaso-occlusive preventative measures will improve Outcome: Adequate for Discharge Goal: Awareness of infection prevention will improve Outcome: Adequate for Discharge Goal:  Awareness of signs and symptoms of anemia will improve Outcome: Adequate for Discharge Goal: Long-term complications will improve Outcome: Adequate for Discharge   Problem: Self-Care: Goal: Ability to incorporate actions that prevent/reduce pain crisis will improve Outcome: Adequate for Discharge   Problem: Bowel/Gastric: Goal: Gut motility will be maintained Outcome: Adequate for Discharge   Problem: Tissue Perfusion: Goal: Complications related to inadequate tissue perfusion will be avoided or minimized Outcome: Adequate for Discharge   Problem: Respiratory: Goal: Pulmonary complications will be avoided or minimized Outcome: Adequate for Discharge Goal: Acute Chest Syndrome will be identified early to prevent complications Outcome: Adequate for Discharge   Problem: Fluid Volume: Goal: Ability to maintain a balanced intake and output will improve Outcome: Adequate for Discharge   Problem: Sensory: Goal: Pain level will decrease with appropriate interventions Outcome: Adequate for Discharge   Problem: Health Behavior: Goal: Postive changes in compliance with treatment and prescription regimens will improve Outcome: Adequate for Discharge

## 2023-09-25 ENCOUNTER — Telehealth (HOSPITAL_COMMUNITY): Payer: Self-pay | Admitting: *Deleted

## 2023-09-25 NOTE — Telephone Encounter (Signed)
 Patient called requesting to come to the day hospital for sickle cell pain. Patient reports bilateral leg pain rated 8/10. Reports taking Oxycodone  and Methadone  at 5:00 am. Denies fever, chest pain, nausea, vomiting, diarrhea and abdominal pain. Homer, NP notified. The day hospital is now at capacity and unable to accept additional patients. Patient advised to try and manage with home medications and to go to the ER if needed. Patient expresses an understanding.

## 2023-09-27 ENCOUNTER — Other Ambulatory Visit: Payer: Self-pay

## 2023-09-27 ENCOUNTER — Emergency Department (HOSPITAL_BASED_OUTPATIENT_CLINIC_OR_DEPARTMENT_OTHER)
Admission: EM | Admit: 2023-09-27 | Discharge: 2023-09-27 | Disposition: A | Discharge status: Home / Self Care | Attending: Emergency Medicine | Admitting: Emergency Medicine

## 2023-09-27 DIAGNOSIS — D57219 Sickle-cell/Hb-C disease with crisis, unspecified: Secondary | ICD-10-CM | POA: Diagnosis present

## 2023-09-27 DIAGNOSIS — D5700 Hb-SS disease with crisis, unspecified: Secondary | ICD-10-CM

## 2023-09-27 LAB — CBC WITH DIFFERENTIAL/PLATELET
Abs Immature Granulocytes: 0.01 K/uL (ref 0.00–0.07)
Basophils Absolute: 0 K/uL (ref 0.0–0.1)
Basophils Relative: 0 %
Eosinophils Absolute: 0.3 K/uL (ref 0.0–0.5)
Eosinophils Relative: 3 %
HCT: 32.5 % — ABNORMAL LOW (ref 36.0–46.0)
Hemoglobin: 11.4 g/dL — ABNORMAL LOW (ref 12.0–15.0)
Immature Granulocytes: 0 %
Lymphocytes Relative: 33 %
Lymphs Abs: 2.7 K/uL (ref 0.7–4.0)
MCH: 23.7 pg — ABNORMAL LOW (ref 26.0–34.0)
MCHC: 35.1 g/dL (ref 30.0–36.0)
MCV: 67.4 fL — ABNORMAL LOW (ref 80.0–100.0)
Monocytes Absolute: 0.7 K/uL (ref 0.1–1.0)
Monocytes Relative: 9 %
Neutro Abs: 4.6 K/uL (ref 1.7–7.7)
Neutrophils Relative %: 55 %
Platelets: 255 K/uL (ref 150–400)
RBC: 4.82 MIL/uL (ref 3.87–5.11)
RDW: 15.7 % — ABNORMAL HIGH (ref 11.5–15.5)
WBC: 8.4 K/uL (ref 4.0–10.5)
nRBC: 0 % (ref 0.0–0.2)

## 2023-09-27 LAB — COMPREHENSIVE METABOLIC PANEL WITH GFR
ALT: 9 U/L (ref 0–44)
AST: 22 U/L (ref 15–41)
Albumin: 3.9 g/dL (ref 3.5–5.0)
Alkaline Phosphatase: 83 U/L (ref 38–126)
Anion gap: 11 (ref 5–15)
BUN: 8 mg/dL (ref 6–20)
CO2: 24 mmol/L (ref 22–32)
Calcium: 9.1 mg/dL (ref 8.9–10.3)
Chloride: 105 mmol/L (ref 98–111)
Creatinine, Ser: 0.61 mg/dL (ref 0.44–1.00)
GFR, Estimated: >60 mL/min (ref >60)
Glucose, Bld: 117 mg/dL — ABNORMAL HIGH (ref 70–99)
Potassium: 4.4 mmol/L (ref 3.5–5.1)
Sodium: 139 mmol/L (ref 135–145)
Total Bilirubin: 0.5 mg/dL (ref 0.0–1.2)
Total Protein: 7.3 g/dL (ref 6.5–8.1)

## 2023-09-27 LAB — RETICULOCYTES
Immature Retic Fract: 25.5 % — ABNORMAL HIGH (ref 2.3–15.9)
RBC.: 4.81 MIL/uL (ref 3.87–5.11)
Retic Count, Absolute: 134.2 K/uL (ref 19.0–186.0)
Retic Ct Pct: 2.8 % (ref 0.4–3.1)

## 2023-09-27 MED ORDER — HYDROMORPHONE HCL 1 MG/ML IJ SOLN
2.0000 mg | INTRAMUSCULAR | Status: AC
  Administered 2023-09-27: 2 mg via INTRAVENOUS
  Filled 2023-09-27: qty 2

## 2023-09-27 MED ORDER — DIPHENHYDRAMINE HCL 25 MG PO CAPS
25.0000 mg | ORAL_CAPSULE | ORAL | Status: DC | PRN
  Administered 2023-09-27: 25 mg via ORAL
  Filled 2023-09-27: qty 1

## 2023-09-27 MED ORDER — ONDANSETRON HCL 4 MG/2ML IJ SOLN
4.0000 mg | INTRAMUSCULAR | Status: DC | PRN
  Administered 2023-09-27: 4 mg via INTRAVENOUS
  Filled 2023-09-27: qty 2

## 2023-09-27 MED ORDER — DEXTROSE-SODIUM CHLORIDE 5-0.45 % IV SOLN: INTRAVENOUS | Status: DC

## 2023-09-27 NOTE — ED Provider Notes (Signed)
 [place] [facility] Provider Note   CSN: [id number] Arrival date & time: 09/27/23  0406     Patient presents with: Sickle Cell Pain Crisis   Stephanie Burch is a 43 y.o. female.  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  Reviewed the records appears she was admitted the last couple time she presented in July for sickle cell crisis.  She states has been taking her home 30 mg oxycodone  10 mg methadone  as directed without improvement.  She states that she thinks it is triggered by stress since her son is going off to college and this will to drive him up there today.  She denies any trauma, fevers or any other difference from her previous episodes.  She has discomfort in her legs and her arm.  She states these are normal spots for her to have sickle cell crises.  Her sickle cell doctor is Dr. Jegede.    Sickle Cell Pain Crisis      Prior to Admission medications   Medication Sig Start Date End Date Taking? Authorizing Provider  acetaminophen  (TYLENOL ) 500 MG tablet Take 1,000 mg by mouth every 6 (six) hours as needed for mild pain (pain score 1-3) or headache.    [provider]  ibuprofen  (ADVIL ) 200 MG tablet Take 800 mg by mouth every 6 (six) hours as needed for mild pain (pain score 1-3) or headache.    [provider]  methadone  (DOLOPHINE ) 10 MG tablet Take 10-20 mg by mouth See admin instructions. Take 20 mg by mouth in the morning and 10 mg midday & at bedtime 03/26/23   [provider]  naloxone  (NARCAN ) nasal spray 4 mg/0.1 mL Use as needed for opiod overdose 03/04/22   Paseda, Folashade R, FNP  oxycodone  (ROXICODONE ) 30 MG immediate release tablet Take 30 mg by mouth 6 (six) times daily.    [provider]  promethazine  (PHENERGAN ) 25 MG tablet Take 25 mg by mouth every 8 (eight) hours as needed for nausea or vomiting.     [provider]  valACYclovir  (VALTREX ) 1000 MG tablet Take 1,000 mg by mouth daily. 01/15/23   [provider]    Allergies: Patient has no known allergies.    Review of Systems  Updated Vital Signs BP (!) 177/86 (BP Location: Right Arm)   Pulse 99   Temp 98.2 F (36.8 C) (Oral)   Resp 18   Ht 5' 5 (1.651 m)   Wt (!) 142.9 kg   LMP 09/23/2023   SpO2 98%   BMI 52.42 kg/m   Physical Exam Vitals and nursing note reviewed.  Constitutional:      Appearance: She is well-developed.  HENT:     Head: Normocephalic and atraumatic.  Cardiovascular:     Rate and Rhythm: Normal rate and regular rhythm.  Pulmonary:     Effort: No respiratory distress.     Breath sounds: No stridor.  Abdominal:     General: There is no distension.  Musculoskeletal:        General: No swelling or tenderness. Normal range of motion.     Cervical back: Normal range of motion.  Skin:    General: Skin is warm and dry.  Neurological:     Mental Status: She is alert.     (all labs ordered are listed, but only abnormal results are displayed) Labs Reviewed  CBC WITH DIFFERENTIAL/PLATELET  RETICULOCYTES  COMPREHENSIVE METABOLIC PANEL WITH GFR    EKG: None  Radiology: No results found.  {Document cardiac monitor, telemetry assessment procedure when appropriate:32947} Procedures   Medications Ordered in the ED  dextrose  5 % and 0.45 % NaCl infusion (has no administration in time range)  HYDROmorphone  (DILAUDID ) injection 2 mg (has no administration in time range)  HYDROmorphone  (DILAUDID ) injection 2 mg (has no administration in time range)  HYDROmorphone  (DILAUDID ) injection 2 mg (has no administration in time range)  ondansetron  (ZOFRAN ) injection 4 mg (has no administration in time range)  diphenhydrAMINE  (BENADRYL ) capsule 25-50 mg (has no administration in time range)      {Click here for ABCD2, HEART and other calculators REFRESH Note before signing:1}                               Medical Decision Making Amount and/or Complexity of Data Reviewed Labs: ordered.  Risk Prescription drug management.  Suspected sickle cell pain crisis.  No red flags to suggest need for further workup such as imaging, cultures.  Will check labs and treat symptomatically and reevaluate for disposition. ***  {Document critical care time when appropriate  Document review of labs and clinical decision tools ie CHADS2VASC2, etc  Document your independent review of radiology images and any outside records  Document your discussion with family members, caretakers and with consultants  Document social determinants of health affecting pt's care  Document your decision making why or why not admission, treatments were needed:32947:::1}   Final diagnoses:  None    ED Discharge Orders     None

## 2023-09-27 NOTE — ED Triage Notes (Signed)
 Sickle cell pain started last night.  Taking home meds with no relief.  Pain in both legs and left arm.

## 2023-10-01 ENCOUNTER — Other Ambulatory Visit: Payer: Self-pay

## 2023-10-01 ENCOUNTER — Telehealth (HOSPITAL_COMMUNITY): Payer: Self-pay | Admitting: General Practice

## 2023-10-01 ENCOUNTER — Encounter (HOSPITAL_COMMUNITY): Payer: Self-pay

## 2023-10-01 ENCOUNTER — Inpatient Hospital Stay (HOSPITAL_COMMUNITY)
Admission: EM | Admit: 2023-10-01 | Discharge: 2023-10-04 | DRG: 812 | Disposition: A | Discharge status: Home / Self Care | Attending: Internal Medicine | Admitting: Internal Medicine

## 2023-10-01 DIAGNOSIS — Z8249 Family history of ischemic heart disease and other diseases of the circulatory system: Secondary | ICD-10-CM

## 2023-10-01 DIAGNOSIS — R0981 Nasal congestion: Secondary | ICD-10-CM | POA: Diagnosis present

## 2023-10-01 DIAGNOSIS — F32A Depression, unspecified: Secondary | ICD-10-CM | POA: Diagnosis present

## 2023-10-01 DIAGNOSIS — D72829 Elevated white blood cell count, unspecified: Secondary | ICD-10-CM | POA: Diagnosis present

## 2023-10-01 DIAGNOSIS — Z832 Family history of diseases of the blood and blood-forming organs and certain disorders involving the immune mechanism: Secondary | ICD-10-CM | POA: Diagnosis not present

## 2023-10-01 DIAGNOSIS — D5700 Hb-SS disease with crisis, unspecified: Secondary | ICD-10-CM | POA: Diagnosis present

## 2023-10-01 DIAGNOSIS — G894 Chronic pain syndrome: Secondary | ICD-10-CM | POA: Diagnosis present

## 2023-10-01 DIAGNOSIS — Z6841 Body Mass Index (BMI) 40.0 and over, adult: Secondary | ICD-10-CM

## 2023-10-01 DIAGNOSIS — Z8616 Personal history of COVID-19: Secondary | ICD-10-CM

## 2023-10-01 DIAGNOSIS — Z79899 Other long term (current) drug therapy: Secondary | ICD-10-CM | POA: Diagnosis not present

## 2023-10-01 DIAGNOSIS — F1120 Opioid dependence, uncomplicated: Secondary | ICD-10-CM | POA: Diagnosis present

## 2023-10-01 DIAGNOSIS — Z833 Family history of diabetes mellitus: Secondary | ICD-10-CM

## 2023-10-01 LAB — CBC WITH DIFFERENTIAL/PLATELET
Abs Granulocyte: 4.1 K/uL (ref 1.5–6.5)
Abs Immature Granulocytes: 0.02 K/uL (ref 0.00–0.07)
Basophils Absolute: 0 K/uL (ref 0.0–0.1)
Basophils Relative: 1 %
Eosinophils Absolute: 0.2 K/uL (ref 0.0–0.5)
Eosinophils Relative: 2 %
HCT: 35.9 % — ABNORMAL LOW (ref 36.0–46.0)
Hemoglobin: 11.7 g/dL — ABNORMAL LOW (ref 12.0–15.0)
Immature Granulocytes: 0 %
Lymphocytes Relative: 30 %
Lymphs Abs: 2.1 K/uL (ref 0.7–4.0)
MCH: 22.8 pg — ABNORMAL LOW (ref 26.0–34.0)
MCHC: 32.6 g/dL (ref 30.0–36.0)
MCV: 70 fL — ABNORMAL LOW (ref 80.0–100.0)
Monocytes Absolute: 0.6 K/uL (ref 0.1–1.0)
Monocytes Relative: 8 %
Neutro Abs: 4.1 K/uL (ref 1.7–7.7)
Neutrophils Relative %: 59 %
Platelets: 256 K/uL (ref 150–400)
RBC: 5.13 MIL/uL — ABNORMAL HIGH (ref 3.87–5.11)
RDW: 15.5 % (ref 11.5–15.5)
WBC: 7 K/uL (ref 4.0–10.5)
nRBC: 0 % (ref 0.0–0.2)

## 2023-10-01 LAB — RETICULOCYTES
Immature Retic Fract: 31 % — ABNORMAL HIGH (ref 2.3–15.9)
RBC.: 5.07 MIL/uL (ref 3.87–5.11)
Retic Count, Absolute: 145 K/uL (ref 19.0–186.0)
Retic Ct Pct: 2.9 % (ref 0.4–3.1)

## 2023-10-01 LAB — COMPREHENSIVE METABOLIC PANEL WITH GFR
ALT: 10 U/L (ref 0–44)
AST: 13 U/L — ABNORMAL LOW (ref 15–41)
Albumin: 4.1 g/dL (ref 3.5–5.0)
Alkaline Phosphatase: 86 U/L (ref 38–126)
Anion gap: 10 (ref 5–15)
BUN: 6 mg/dL (ref 6–20)
CO2: 24 mmol/L (ref 22–32)
Calcium: 9.6 mg/dL (ref 8.9–10.3)
Chloride: 108 mmol/L (ref 98–111)
Creatinine, Ser: 0.67 mg/dL (ref 0.44–1.00)
GFR, Estimated: >60 mL/min (ref >60)
Glucose, Bld: 104 mg/dL — ABNORMAL HIGH (ref 70–99)
Potassium: 4.3 mmol/L (ref 3.5–5.1)
Sodium: 143 mmol/L (ref 135–145)
Total Bilirubin: 0.7 mg/dL (ref 0.0–1.2)
Total Protein: 7.6 g/dL (ref 6.5–8.1)

## 2023-10-01 LAB — HCG, SERUM, QUALITATIVE: Preg, Serum: NEGATIVE

## 2023-10-01 MED ORDER — METHADONE HCL 10 MG PO TABS
20.0000 mg | ORAL_TABLET | Freq: Every day | ORAL | Status: DC
  Administered 2023-10-02 – 2023-10-04 (×3): 20 mg via ORAL
  Filled 2023-10-01 (×3): qty 2

## 2023-10-01 MED ORDER — ONDANSETRON HCL 4 MG/2ML IJ SOLN
4.0000 mg | Freq: Four times a day (QID) | INTRAMUSCULAR | Status: DC | PRN
  Administered 2023-10-02: 4 mg via INTRAVENOUS
  Filled 2023-10-01: qty 2

## 2023-10-01 MED ORDER — KETOROLAC TROMETHAMINE 15 MG/ML IJ SOLN
15.0000 mg | INTRAMUSCULAR | Status: AC
  Administered 2023-10-01: 15 mg via INTRAVENOUS
  Filled 2023-10-01: qty 1

## 2023-10-01 MED ORDER — DIPHENHYDRAMINE HCL 25 MG PO CAPS: 25.0000 mg | ORAL_CAPSULE | ORAL | Status: DC | PRN

## 2023-10-01 MED ORDER — NALOXONE HCL 0.4 MG/ML IJ SOLN: 0.4000 mg | INTRAMUSCULAR | Status: DC | PRN

## 2023-10-01 MED ORDER — HYDROMORPHONE 1 MG/ML IV SOLN
INTRAVENOUS | Status: DC
  Administered 2023-10-01 – 2023-10-02 (×2): 30 mg via INTRAVENOUS
  Administered 2023-10-02: 11 mg via INTRAVENOUS
  Administered 2023-10-02 (×2): 30 mg via INTRAVENOUS
  Administered 2023-10-02: 5 mg via INTRAVENOUS
  Administered 2023-10-03: 15.5 mg via INTRAVENOUS
  Administered 2023-10-03 (×2): 30 mg via INTRAVENOUS
  Administered 2023-10-03: 4 mg via INTRAVENOUS
  Administered 2023-10-03: 12.5 mg via INTRAVENOUS
  Administered 2023-10-03: 10 mg via INTRAVENOUS
  Administered 2023-10-04: 7.5 mg via INTRAVENOUS
  Administered 2023-10-04: 30 mg via INTRAVENOUS
  Administered 2023-10-04: 7 mg via INTRAVENOUS
  Filled 2023-10-01 (×7): qty 30

## 2023-10-01 MED ORDER — HYDROMORPHONE HCL 1 MG/ML IJ SOLN
2.0000 mg | INTRAMUSCULAR | Status: AC
  Administered 2023-10-01: 2 mg via INTRAVENOUS
  Filled 2023-10-01: qty 2

## 2023-10-01 MED ORDER — OXYCODONE HCL 5 MG PO TABS
30.0000 mg | ORAL_TABLET | ORAL | Status: DC | PRN
  Administered 2023-10-01 – 2023-10-04 (×15): 30 mg via ORAL
  Filled 2023-10-01 (×15): qty 6

## 2023-10-01 MED ORDER — SENNOSIDES-DOCUSATE SODIUM 8.6-50 MG PO TABS
1.0000 | ORAL_TABLET | Freq: Two times a day (BID) | ORAL | Status: DC
  Filled 2023-10-01 (×2): qty 1

## 2023-10-01 MED ORDER — ONDANSETRON HCL 4 MG/2ML IJ SOLN: 4.0000 mg | INTRAMUSCULAR | Status: DC | PRN

## 2023-10-01 MED ORDER — METHADONE HCL 10 MG PO TABS: 10.0000 mg | ORAL_TABLET | ORAL | Status: DC

## 2023-10-01 MED ORDER — METHADONE HCL 10 MG PO TABS
10.0000 mg | ORAL_TABLET | Freq: Two times a day (BID) | ORAL | Status: DC
  Administered 2023-10-01 – 2023-10-03 (×5): 10 mg via ORAL
  Filled 2023-10-01 (×5): qty 1

## 2023-10-01 MED ORDER — SODIUM CHLORIDE 0.9% FLUSH: 9.0000 mL | INTRAVENOUS | Status: DC | PRN

## 2023-10-01 MED ORDER — POLYETHYLENE GLYCOL 3350 17 G PO PACK: 17.0000 g | PACK | Freq: Every day | ORAL | Status: DC | PRN

## 2023-10-01 MED ORDER — KETOROLAC TROMETHAMINE 15 MG/ML IJ SOLN
15.0000 mg | Freq: Four times a day (QID) | INTRAMUSCULAR | Status: DC
  Administered 2023-10-01 – 2023-10-04 (×12): 15 mg via INTRAVENOUS
  Filled 2023-10-01 (×12): qty 1

## 2023-10-01 MED ORDER — ENOXAPARIN SODIUM 80 MG/0.8ML IJ SOSY
70.0000 mg | PREFILLED_SYRINGE | INTRAMUSCULAR | Status: DC
  Administered 2023-10-01 – 2023-10-03 (×3): 70 mg via SUBCUTANEOUS
  Filled 2023-10-01 (×3): qty 0.8

## 2023-10-01 MED ORDER — HYDROMORPHONE HCL 1 MG/ML IJ SOLN: 1.0000 mg | INTRAMUSCULAR | Status: DC | PRN

## 2023-10-01 NOTE — Progress Notes (Signed)
 PHARMACY - ANTICOAGULATION CONSULT NOTE  Pharmacy Consult for Lovenox  Indication: VTE prophylaxis  No Known Allergies  Patient Measurements:    Vital Signs: Temp: 98.5 F (36.9 C) (08/28 1012) Temp Source: Oral (08/28 1012) BP: 138/72 (08/28 1230) Pulse Rate: 73 (08/28 1230)  Labs: Recent Labs    10/01/23 1033  HGB 11.7*  HCT 35.9*  PLT 256  CREATININE 0.67    Estimated Creatinine Clearance: 134.9 mL/min (by C-G formula based on SCr of 0.67 mg/dL).   Medical History: Past Medical History:  Diagnosis Date   Acute kidney injury (HCC) 11/07/2019   Anemia    Chronic pain    COVID-19 virus detected 10/22/2019   Depression    HCAP (healthcare-associated pneumonia) 11/07/2019   History of blood transfusion 2013   x 2   HSV infection    Hypoxia    Pneumonia    x 1   Pulmonary edema 11/11/2019   Sepsis (HCC) 11/07/2019   Sickle cell anemia (HCC) 09/2021   gets them frequently   Tachycardia with heart rate 100-120 beats per minute    Vitamin B12 deficiency 12/2018   Vitamin D  deficiency      Plan:  Lovenox  0.5 mg/kg = 70 mg sq q24 for VTE px in pt with BMI > 30  Pharmacy to sign off  Rosaline IVAR Edison, Pharm.D Use secure chat for questions 10/01/2023 2:51 PM

## 2023-10-01 NOTE — ED Provider Notes (Signed)
 [place] [facility] AT [facility] Provider Note   CSN: [id number] Arrival date & time: 10/01/23  1009     Patient presents with: Sickle Cell Pain Crisis   Stephanie Burch is a 43 y.o. female.  She has a history of sickle cell disease and is complaining of a sickle cell pain crisis.  Pain in her left arm and both of her legs.  Not responsive to her oral medication.  She thinks it was provoked by her recent car trip to [place]  to take her son to school.  No fever chest pain shortness of breath.  Rates the pain as severe.  Started yesterday.   The history is provided by the patient.  Sickle Cell Pain Crisis Location:  Upper extremity and lower extremity Severity:  Severe Onset quality:  Gradual Duration:  2 days Similar to previous crisis episodes: yes   Timing:  Constant Progression:  Unchanged Chronicity:  Recurrent Relieved by:  Nothing Worsened by:  Movement Ineffective treatments:  Prescription drugs Associated symptoms: no chest pain, no cough, no fever, no shortness of breath and no vision change        Prior to Admission medications   Medication Sig Start Date End Date Taking? Authorizing Provider  acetaminophen  (TYLENOL ) 500 MG tablet Take 1,000 mg by mouth every 6 (six) hours as needed for mild pain (pain score 1-3) or headache.    [provider]  ibuprofen  (ADVIL ) 200 MG tablet Take 800 mg by mouth every 6 (six) hours as needed for mild pain (pain score 1-3) or headache.    [provider]  methadone  (DOLOPHINE ) 10 MG tablet Take 10-20 mg by mouth See admin instructions. Take 20 mg by mouth in the morning and 10 mg midday & at bedtime 03/26/23   [provider]  naloxone  (NARCAN ) nasal spray 4 mg/0.1 mL Use as needed for opiod overdose 03/04/22   Paseda, Folashade R, FNP  oxycodone  (ROXICODONE ) 30 MG immediate release tablet Take 30 mg by mouth 6 (six) times daily.    [provider]  promethazine  (PHENERGAN )  25 MG tablet Take 25 mg by mouth every 8 (eight) hours as needed for nausea or vomiting.    [provider]  valACYclovir  (VALTREX ) 1000 MG tablet Take 1,000 mg by mouth daily. 01/15/23   [provider]    Allergies: Patient has no known allergies.    Review of Systems  Constitutional:  Negative for fever.  Eyes:  Negative for visual disturbance.  Respiratory:  Negative for cough and shortness of breath.   Cardiovascular:  Negative for chest pain.  Gastrointestinal:  Negative for abdominal pain.    Updated Vital Signs BP (!) 160/105 (BP Location: Left Arm)   Pulse 90   Temp 98.5 F (36.9 C) (Oral)   Resp 16   LMP 09/23/2023   SpO2 100%   Physical Exam Vitals and nursing note reviewed.  Constitutional:      General: She is not in acute distress.    Appearance: Normal appearance. She is well-developed.  HENT:     Head: Normocephalic and atraumatic.  Eyes:     Conjunctiva/sclera: Conjunctivae normal.  Cardiovascular:     Rate and Rhythm: Normal rate and regular rhythm.     Heart sounds: No murmur heard. Pulmonary:     Effort: Pulmonary effort is normal. No respiratory distress.     Breath sounds: Normal breath sounds. No stridor. No wheezing.  Abdominal:     Palpations:  Abdomen is soft.     Tenderness: There is no abdominal tenderness. There is no guarding or rebound.  Musculoskeletal:        General: Tenderness present. No deformity.     Cervical back: Neck supple.  Skin:    General: Skin is warm and dry.  Neurological:     General: No focal deficit present.     Mental Status: She is alert.     GCS: GCS eye subscore is 4. GCS verbal subscore is 5. GCS motor subscore is 6.     (all labs ordered are listed, but only abnormal results are displayed) Labs Reviewed  COMPREHENSIVE METABOLIC PANEL WITH GFR - Abnormal; Notable for the following components:      Result Value   Glucose, Bld 104 (*)    AST 13 (*)    All other components within normal  limits  CBC WITH DIFFERENTIAL/PLATELET - Abnormal; Notable for the following components:   RBC 5.13 (*)    Hemoglobin 11.7 (*)    HCT 35.9 (*)    MCV 70.0 (*)    MCH 22.8 (*)    All other components within normal limits  RETICULOCYTES - Abnormal; Notable for the following components:   Immature Retic Fract 31.0 (*)    All other components within normal limits  HCG, SERUM, QUALITATIVE  CBC  CREATININE, SERUM  CBC    EKG: None  Radiology: No results found.   Procedures   Medications Ordered in the ED  ketorolac  (TORADOL ) 15 MG/ML injection 15 mg (has no administration in time range)  ondansetron  (ZOFRAN ) injection 4 mg (has no administration in time range)  HYDROmorphone  (DILAUDID ) injection 2 mg (has no administration in time range)  HYDROmorphone  (DILAUDID ) injection 2 mg (has no administration in time range)  HYDROmorphone  (DILAUDID ) injection 2 mg (has no administration in time range)    Clinical Course as of 10/01/23 1812  Thu Oct 01, 2023  1333 Discussed with Dr. Cherylene from the sickle cell service who will evaluate patient for admission. [MB]    Clinical Course User Index [MB] Towana Ozell BROCKS, MD                                 Medical Decision Making Risk Prescription drug management. Decision regarding hospitalization.   This patient complains of pain in her legs and left arm; this involves an extensive number of treatment Options and is a complaint that carries with it a high risk of complications and morbidity. The differential includes sickle cell pain crisis, vaso-occlusion, acute chest  I ordered, reviewed and interpreted labs, which included CBC with stable low hemoglobin, chemistries unremarkable I ordered medication IV pain medicine and reviewed PMP when indicated. Previous records obtained and reviewed in epic including prior ED visits and admissions I consulted sickle cell team Dr. Cherylene and discussed lab and imaging findings and discussed  disposition.  Cardiac monitoring reviewed, sinus rhythm Social determinants considered, depression stress utility insecurity Critical Interventions: None  After the interventions stated above, I reevaluated the patient and found patient still to be with significant pain Admission and further testing considered, she would benefit from mission the hospital for further management.  She is agreeable plan for admission.      Final diagnoses:  Sickle cell pain crisis [facility])    ED Discharge Orders     None          Towana Ozell BROCKS,  MD 10/01/23 1814

## 2023-10-01 NOTE — Plan of Care (Signed)

## 2023-10-01 NOTE — H&P (Incomplete Revision)
 H&P  Patient Demographics:  Stephanie Burch, is a 43 y.o. female  MRN: [id number]   DOB - 05/05/1980  Admit Date - 10/01/2023  Outpatient Primary MD for the patient is Pcp, No  Chief Complaint  Patient presents with   Sickle Cell Pain Crisis      HPI:   Stephanie Burch  is a 43 y.o. female    Review of systems:  In addition to the HPI above, patient reports No fever or chills No Headache, No changes with vision or hearing No problems swallowing food or liquids No chest pain, cough or shortness of breath No abdominal pain, No nausea or vomiting, Bowel movements are regular No blood in stool or urine No dysuria No new skin rashes or bruises No new joints pains-aches No new weakness, tingling, numbness in any extremity No recent weight gain or loss No polyuria, polydypsia or polyphagia No significant Mental Stressors  A full 10 point Review of Systems was done, except as stated above, all other Review of Systems were negative.  With Past History of the following :   Past Medical History:  Diagnosis Date   Acute kidney injury (HCC) 11/07/2019   Anemia    Chronic pain    COVID-19 virus detected 10/22/2019   Depression    HCAP (healthcare-associated pneumonia) 11/07/2019   History of blood transfusion 2013   x 2   HSV infection    Hypoxia    Pneumonia    x 1   Pulmonary edema 11/11/2019   Sepsis (HCC) 11/07/2019   Sickle cell anemia (HCC) 09/2021   gets them frequently   Tachycardia with heart rate 100-120 beats per minute    Vitamin B12 deficiency 12/2018   Vitamin D  deficiency       Past Surgical History:  Procedure Laterality Date   CESAREAN SECTION  2013   x 1   TOOTH EXTRACTION N/A 10/18/2021   Procedure: DENTAL RESTORATION/EXTRACTIONS;  Surgeon: Sheryle Hamilton, DMD;  Location: [facility] OR;  Service: Oral Surgery;  Laterality: N/A;     Social History:   Social History   Tobacco Use   Smoking status: Never   Smokeless tobacco: Never  Substance Use Topics    Alcohol use: Not Currently     Lives - At home   Family History :   Family History  Problem Relation Age of Onset   Hypertension Mother    Sickle cell trait Mother    Diabetes Father    Sickle cell trait Father      Home Medications:   Prior to Admission medications   Medication Sig Start Date End Date Taking? Authorizing Provider  acetaminophen  (TYLENOL ) 500 MG tablet Take 1,000 mg by mouth every 6 (six) hours as needed for mild pain (pain score 1-3) or headache.    [provider]  ibuprofen  (ADVIL ) 200 MG tablet Take 800 mg by mouth every 6 (six) hours as needed for mild pain (pain score 1-3) or headache.    [provider]  methadone  (DOLOPHINE ) 10 MG tablet Take 10-20 mg by mouth See admin instructions. Take 20 mg by mouth in the morning and 10 mg midday & at bedtime 03/26/23   [provider]  naloxone  (NARCAN ) nasal spray 4 mg/0.1 mL Use as needed for opiod overdose 03/04/22   Paseda, Folashade R, FNP  oxycodone  (ROXICODONE ) 30 MG immediate release tablet Take 30 mg by mouth 6 (six) times daily.    [provider]  promethazine  (PHENERGAN ) 25 MG tablet  Take 25 mg by mouth every 8 (eight) hours as needed for nausea or vomiting.    [provider]  valACYclovir  (VALTREX ) 1000 MG tablet Take 1,000 mg by mouth daily. 01/15/23   [provider]     Allergies:   No Known Allergies   Physical Exam:   Vitals:   Vitals:   10/01/23 1012 10/01/23 1230  BP: (!) 160/105 138/72  Pulse: 90 73  Resp: 16 20  Temp: 98.5 F (36.9 C)   SpO2: 100% 100%    Physical Exam: Constitutional: Patient appears well-developed and well-nourished. Not in obvious distress. HENT: Normocephalic, atraumatic, External right and left ear normal. Oropharynx is clear and moist.  Eyes: Conjunctivae and EOM are normal. PERRLA, no scleral icterus. Neck: Normal ROM. Neck supple. No JVD. No tracheal deviation. No thyromegaly. CVS: RRR, S1/S2 +, no  murmurs, no gallops, no carotid bruit.  Pulmonary: Effort and breath sounds normal, no stridor, rhonchi, wheezes, rales.  Abdominal: Soft. BS +, no distension, tenderness, rebound or guarding.  Musculoskeletal: Normal range of motion. No edema and no tenderness.  Lymphadenopathy: No lymphadenopathy noted, cervical, inguinal or axillary Neuro: Alert. Normal reflexes, muscle tone coordination. No cranial nerve deficit. Skin: Skin is warm and dry. No rash noted. Not diaphoretic. No erythema. No pallor. Psychiatric: Normal mood and affect. Behavior, judgment, thought content normal.   Data Review:   CBC Recent Labs  Lab 09/27/23 0447 10/01/23 1033  WBC 8.4 7.0  HGB 11.4* 11.7*  HCT 32.5* 35.9*  PLT 255 256  MCV 67.4* 70.0*  MCH 23.7* 22.8*  MCHC 35.1 32.6  RDW 15.7* 15.5  LYMPHSABS 2.7 2.1  MONOABS 0.7 0.6  EOSABS 0.3 0.2  BASOSABS 0.0 0.0   ------------------------------------------------------------------------------------------------------------------  Chemistries  Recent Labs  Lab 09/27/23 0447 10/01/23 1033  NA 139 143  K 4.4 4.3  CL 105 108  CO2 24 24  GLUCOSE 117* 104*  BUN 8 6  CREATININE 0.61 0.67  CALCIUM 9.1 9.6  AST 22 13*  ALT 9 10  ALKPHOS 83 86  BILITOT 0.5 0.7   ------------------------------------------------------------------------------------------------------------------ estimated creatinine clearance is 134.9 mL/min (by C-G formula based on SCr of 0.67 mg/dL). ------------------------------------------------------------------------------------------------------------------ No results for input(s): TSH, T4TOTAL, T3FREE, THYROIDAB in the last 72 hours.  Invalid input(s): FREET3  Coagulation profile No results for input(s): INR, PROTIME in the last 168 hours. ------------------------------------------------------------------------------------------------------------------- No results for input(s): DDIMER in the last 72  hours. -------------------------------------------------------------------------------------------------------------------  Cardiac Enzymes No results for input(s): CKMB, TROPONINI, MYOGLOBIN in the last 168 hours.  Invalid input(s): CK ------------------------------------------------------------------------------------------------------------------    Component Value Date/Time   BNP 17.5 11/21/2021 1403    ---------------------------------------------------------------------------------------------------------------  Urinalysis    Component Value Date/Time   COLORURINE YELLOW 04/19/2023 1905   APPEARANCEUR HAZY (A) 04/19/2023 1905   LABSPEC 1.012 04/19/2023 1905   PHURINE 5.0 04/19/2023 1905   GLUCOSEU NEGATIVE 04/19/2023 1905   HGBUR NEGATIVE 04/19/2023 1905   BILIRUBINUR NEGATIVE 04/19/2023 1905   BILIRUBINUR Negative 04/27/2019 0935   KETONESUR NEGATIVE 04/19/2023 1905   PROTEINUR NEGATIVE 04/19/2023 1905   UROBILINOGEN 1.0 04/27/2019 0935   NITRITE NEGATIVE 04/19/2023 1905   LEUKOCYTESUR NEGATIVE 04/19/2023 1905    ----------------------------------------------------------------------------------------------------------------   Imaging Results:    No results found.   Assessment & Plan:  Principal Problem:   Sickle cell anemia with pain (HCC)   Hb Sickle Cell Disease with crisis: Admit patient, start IVF 0.45% Saline @ 125 mls/hour, start weight based Dilaudid  PCA, start IV Toradol  15 mg  Q 6 H, Restart oral home pain medications, Monitor vitals very closely, Re-evaluate pain scale regularly, 2 L of Oxygen  by [place], Patient will be re-evaluated for pain in the context of function and relationship to baseline as care progresses. Leukocytosis:  Anemia of Chronic Disease:  Chronic pain Syndrome:    DVT Prophylaxis: Subcut Lovenox    AM Labs Ordered, also please review Full Orders  Family Communication: Admission, patient's condition and plan of care including  tests being ordered have been discussed with the patient who indicate understanding and agree with the plan and Code Status.  Code Status: Full Code  Consults called: None    Admission status: Inpatient    Time spent in minutes : 50 minutes  Homer CHRISTELLA Cover  10/01/2023 at 2:42 PM

## 2023-10-01 NOTE — ED Notes (Signed)
 Pt requested that blood be drawn the same time an IV is placed. Labels and tubes left at bedside.

## 2023-10-01 NOTE — ED Triage Notes (Signed)
 Pt presents to ED from home C/O sickle cell pain crisis that started yesterday, mostly with pain in both legs and L arm. Home med regimen has not been effective. Pt reports recent travel in car to [place]  this past weekend, and increasing pain since then.

## 2023-10-01 NOTE — H&P (Addendum)
 H&P  Patient Demographics:  Stephanie Burch, is a 43 y.o. female  MRN: [id number]   DOB - [birth year]  Admit Date - 10/01/2023  Outpatient Primary MD for the patient is Pcp, No  Chief Complaint  Patient presents with   Sickle Cell Pain Crisis      HPI:   Stephanie Burch  is a 43 y.o. female with history of sickle cell disease, chronic pain syndrome, opiate dependence and tolerance, obesity, and anemia of chronic disease presents to the emergency department with complaints of generalized pain, that is consistent with her typical pain crisis. Patient says that pain intensity has been elevated over the past few days and unrelieved by her home medications. Pain is worse in bilateral extremities and back. Patient last had oxycodone  this AM without sustained relief. Patient rates pain as 9/10, constant and throbbing. Patient denies fever, chills, chest pain, shortness of breath, nausea, vomiting, or diarrhea. No sick contacts or recent travel no urinary symptoms.   ED Course:  Patient presented to the ED with uncontrolled pain, she was treated with IVF, IV pain medication with no resolution to symptoms. Patient is admitted for unresolved pain and sickle cell pain management.  BP 115/78  Pulse 88  Temp 98.2 F (36.8 C) (Oral)  Resp 16  Ht 5' 5 (1.651 m)  Wt (!) 142.9 kg  LMP 09/23/2023  SpO2 96%  BMI 52.42 kg/m    Review of systems:  In addition to the HPI above, patient reports No fever or chills No Headache, No changes with vision or hearing No problems swallowing food or liquids No chest pain, cough or shortness of breath No abdominal pain, No nausea or vomiting, Bowel movements are regular No blood in stool or urine No dysuria No new skin rashes or bruises No new joints pains-aches No new weakness, tingling, numbness in any extremity No recent weight gain or loss No polyuria, polydypsia or polyphagia No significant Mental Stressors  A full 10 point Review of Systems was done,  except as stated above, all other Review of Systems were negative.  With Past History of the following :   Past Medical History:  Diagnosis Date   Acute kidney injury (HCC) 11/07/2019   Anemia    Chronic pain    COVID-19 virus detected 10/22/2019   Depression    HCAP (healthcare-associated pneumonia) 11/07/2019   History of blood transfusion 2013   x 2   HSV infection    Hypoxia    Pneumonia    x 1   Pulmonary edema 11/11/2019   Sepsis (HCC) 11/07/2019   Sickle cell anemia (HCC) 09/2021   gets them frequently   Tachycardia with heart rate 100-120 beats per minute    Vitamin B12 deficiency 12/2018   Vitamin D  deficiency       Past Surgical History:  Procedure Laterality Date   CESAREAN SECTION  2013   x 1   TOOTH EXTRACTION N/A 10/18/2021   Procedure: DENTAL RESTORATION/EXTRACTIONS;  Surgeon: Sheryle Hamilton, DMD;  Location: [facility] OR;  Service: Oral Surgery;  Laterality: N/A;     Social History:   Social History   Tobacco Use   Smoking status: Never   Smokeless tobacco: Never  Substance Use Topics   Alcohol use: Not Currently     Lives - At home   Family History :   Family History  Problem Relation Age of Onset   Hypertension Mother    Sickle cell trait Mother    Diabetes Father  Sickle cell trait Father      Home Medications:   Prior to Admission medications   Medication Sig Start Date End Date Taking? Authorizing Provider  acetaminophen  (TYLENOL ) 500 MG tablet Take 1,000 mg by mouth every 6 (six) hours as needed for mild pain (pain score 1-3) or headache.    [provider]  ibuprofen  (ADVIL ) 200 MG tablet Take 800 mg by mouth every 6 (six) hours as needed for mild pain (pain score 1-3) or headache.    [provider]  methadone  (DOLOPHINE ) 10 MG tablet Take 10-20 mg by mouth See admin instructions. Take 20 mg by mouth in the morning and 10 mg midday & at bedtime 03/26/23   [provider]  naloxone  (NARCAN ) nasal spray 4 mg/0.1 mL  Use as needed for opiod overdose 03/04/22   Paseda, Folashade R, FNP  oxycodone  (ROXICODONE ) 30 MG immediate release tablet Take 30 mg by mouth 6 (six) times daily.    [provider]  promethazine  (PHENERGAN ) 25 MG tablet Take 25 mg by mouth every 8 (eight) hours as needed for nausea or vomiting.    [provider]  valACYclovir  (VALTREX ) 1000 MG tablet Take 1,000 mg by mouth daily. 01/15/23   [provider]     Allergies:   No Known Allergies   Physical Exam:   Vitals:   Vitals:   10/02/23 0900 10/02/23 1314  BP: 130/78 131/74  Pulse: 75 71  Resp: 14 16  Temp: 97.8 F (36.6 C) 98 F (36.7 C)  SpO2: 98% 98%    Physical Exam: Constitutional: Patient appears well-developed and well-nourished. Not in obvious distress. HENT: Normocephalic, atraumatic, External right and left ear normal. Oropharynx is clear and moist.  Eyes: Conjunctivae and EOM are normal. PERRLA, no scleral icterus. Neck: Normal ROM. Neck supple. No JVD. No tracheal deviation. No thyromegaly. CVS: RRR, S1/S2 +, no murmurs, no gallops, no carotid bruit.  Pulmonary: Effort and breath sounds normal, no stridor, rhonchi, wheezes, rales.  Abdominal: Soft. BS +, no distension, tenderness, rebound or guarding.  Musculoskeletal: Generalize body tenderness.  Lymphadenopathy: No lymphadenopathy noted, cervical, inguinal or axillary Neuro: Alert. Normal reflexes, muscle tone coordination. No cranial nerve deficit. Skin: Skin is warm and dry. No rash noted. Not diaphoretic. No erythema. No pallor. Psychiatric: Normal mood and affect. Behavior, judgment, thought content normal.   Data Review:   CBC Recent Labs  Lab 09/27/23 0447 10/01/23 1033 10/02/23 0659  WBC 8.4 7.0 7.7  HGB 11.4* 11.7* 10.5*  HCT 32.5* 35.9* 31.6*  PLT 255 256 205  MCV 67.4* 70.0* 70.4*  MCH 23.7* 22.8* 23.4*  MCHC 35.1 32.6 33.2  RDW 15.7* 15.5 15.7*  LYMPHSABS 2.7 2.1  --   MONOABS 0.7 0.6  --   EOSABS 0.3  0.2  --   BASOSABS 0.0 0.0  --    ------------------------------------------------------------------------------------------------------------------  Chemistries  Recent Labs  Lab 09/27/23 0447 10/01/23 1033  NA 139 143  K 4.4 4.3  CL 105 108  CO2 24 24  GLUCOSE 117* 104*  BUN 8 6  CREATININE 0.61 0.67  CALCIUM 9.1 9.6  AST 22 13*  ALT 9 10  ALKPHOS 83 86  BILITOT 0.5 0.7   ------------------------------------------------------------------------------------------------------------------ estimated creatinine clearance is 134.9 mL/min (by C-G formula based on SCr of 0.67 mg/dL). ------------------------------------------------------------------------------------------------------------------ No results for input(s): TSH, T4TOTAL, T3FREE, THYROIDAB in the last 72 hours.  Invalid input(s): FREET3  Coagulation profile No results for input(s): INR, PROTIME in the last 168  hours. ------------------------------------------------------------------------------------------------------------------- No results for input(s): DDIMER in the last 72 hours. -------------------------------------------------------------------------------------------------------------------  Cardiac Enzymes No results for input(s): CKMB, TROPONINI, MYOGLOBIN in the last 168 hours.  Invalid input(s): CK ------------------------------------------------------------------------------------------------------------------    Component Value Date/Time   BNP 17.5 11/21/2021 1403    ---------------------------------------------------------------------------------------------------------------  Urinalysis    Component Value Date/Time   COLORURINE YELLOW 04/19/2023 1905   APPEARANCEUR HAZY (A) 04/19/2023 1905   LABSPEC 1.012 04/19/2023 1905   PHURINE 5.0 04/19/2023 1905   GLUCOSEU NEGATIVE 04/19/2023 1905   HGBUR NEGATIVE 04/19/2023 1905   BILIRUBINUR NEGATIVE 04/19/2023 1905    BILIRUBINUR Negative 04/27/2019 0935   KETONESUR NEGATIVE 04/19/2023 1905   PROTEINUR NEGATIVE 04/19/2023 1905   UROBILINOGEN 1.0 04/27/2019 0935   NITRITE NEGATIVE 04/19/2023 1905   LEUKOCYTESUR NEGATIVE 04/19/2023 1905    ----------------------------------------------------------------------------------------------------------------   Imaging Results:    No results found.   Assessment & Plan:  Principal Problem:   Sickle cell anemia with pain (HCC) Active Problems:   Leukocytosis   Chronic pain syndrome   Obesity, morbid, BMI 50 or higher (HCC)   Hb Sickle Cell Disease with crisis: Admit patient, start IVF 0.45% Saline @ 125 mls/hour, start weight based Dilaudid  PCA, start IV Toradol  15 mg Q 6 H, Restart oral home pain medications, Monitor vitals very closely, Re-evaluate pain scale regularly, 2 L of Oxygen  by [place], Patient will be re-evaluated for pain in the context of function and relationship to baseline as care progresses. Leukocytosis: Stable Anemia of Chronic Disease: Hgb within patients base line.  Chronic pain Syndrome: Continue oral home pain medication.  Morbid obesity: Encouraged life style modification, weight loss, diet and exercise    DVT Prophylaxis: Subcut Lovenox    AM Labs Ordered, also please review Full Orders  Family Communication: Admission, patient's condition and plan of care including tests being ordered have been discussed with the patient who indicate understanding and agree with the plan and Code Status.  Code Status: Full Code  Consults called: None    Admission status: Inpatient    Time spent in minutes : 50 minutes  Homer CHRISTELLA Cover NP 10/02/2023 at 2:33 PM

## 2023-10-01 NOTE — Telephone Encounter (Signed)
 Patient called, requesting to come to the day hospital due to pain in the  legs and L arm rated at 8/10. Denied chest pain, fever, diarrhea, abdominal pain, nausea/vomitting. Screened negative for Covid-19 symptoms. Admitted to having means of transportation without driving self after treatment. Last took  30 mg of oxycodone  at 06:00 am today. Per provider, [facility] does not have the capacity to admit the patient. Patient told to continue to take pain medications as prescribed, drink water as instructed and to go to the ER if it's necessary. Patient notified, verbalized understanding.

## 2023-10-02 LAB — CBC
HCT: 31.6 % — ABNORMAL LOW (ref 36.0–46.0)
Hemoglobin: 10.5 g/dL — ABNORMAL LOW (ref 12.0–15.0)
MCH: 23.4 pg — ABNORMAL LOW (ref 26.0–34.0)
MCHC: 33.2 g/dL (ref 30.0–36.0)
MCV: 70.4 fL — ABNORMAL LOW (ref 80.0–100.0)
Platelets: 205 K/uL (ref 150–400)
RBC: 4.49 MIL/uL (ref 3.87–5.11)
RDW: 15.7 % — ABNORMAL HIGH (ref 11.5–15.5)
WBC: 7.7 K/uL (ref 4.0–10.5)
nRBC: 0.3 % — ABNORMAL HIGH (ref 0.0–0.2)

## 2023-10-02 NOTE — Plan of Care (Signed)

## 2023-10-02 NOTE — Plan of Care (Signed)

## 2023-10-02 NOTE — Progress Notes (Signed)
 Patient ID: Stephanie Burch, female   DOB: [birth year], 43 y.o.   MRN: [id number] Subjective: Stephanie Burch is a 43 y.o. female with history of sickle cell disease, chronic pain syndrome, opiate dependence and tolerance, obesity, and anemia of chronic disease presents to the emergency department with complaints of generalized pain, that is consistent with her typical pain crisis.  Patient is endorsing pain of 8/10. She has no new concerns. Denies cough, SOB, N/V/D. No urinary symptoms.   Objective:  Vital signs in last 24 hours:  Vitals:   10/02/23 0459 10/02/23 0834 10/02/23 0900 10/02/23 1314  BP: 135/82  130/78 131/74  Pulse: 83  75 71  Resp: 14 10 14 16   Temp: 98.3 F (36.8 C)  97.8 F (36.6 C) 98 F (36.7 C)  TempSrc: Oral  Oral Oral  SpO2: 98%  98% 98%  Weight:      Height:        Intake/Output from previous day:   Intake/Output Summary (Last 24 hours) at 10/02/2023 1438 Last data filed at 10/02/2023 1320 Gross per 24 hour  Intake 710 ml  Output --  Net 710 ml    Physical Exam: General: Alert, awake, oriented x3, in no acute distress.  HEENT: [place]/AT PEERL, EOMI Neck: Trachea midline,  no masses, no thyromegal,y no JVD, no carotid bruit OROPHARYNX:  Moist, No exudate/ erythema/lesions.  Heart: Regular rate and rhythm, without murmurs, rubs, gallops, PMI non-displaced, no heaves or thrills on palpation.  Lungs: Clear to auscultation, no wheezing or rhonchi noted. No increased vocal fremitus resonant to percussion  Abdomen: Soft, nontender, nondistended, positive bowel sounds, no masses no hepatosplenomegaly noted..  Neuro: No focal neurological deficits noted cranial nerves II through XII grossly intact. DTRs 2+ bilaterally upper and lower extremities. Strength 5 out of 5 in bilateral upper and lower extremities. Musculoskeletal: Generalize body tenderness Psychiatric: Patient alert and oriented x3, good insight and cognition, good recent to remote recall. Lymph node  survey: No cervical axillary or inguinal lymphadenopathy noted.  Lab Results:  Basic Metabolic Panel:    Component Value Date/Time   NA 143 10/01/2023 1033   NA 142 09/11/2017 1431   K 4.3 10/01/2023 1033   CL 108 10/01/2023 1033   CO2 24 10/01/2023 1033   BUN 6 10/01/2023 1033   BUN 8 09/11/2017 1431   CREATININE 0.67 10/01/2023 1033   GLUCOSE 104 (H) 10/01/2023 1033   CALCIUM 9.6 10/01/2023 1033   CBC:    Component Value Date/Time   WBC 7.7 10/02/2023 0659   HGB 10.5 (L) 10/02/2023 0659   HGB 13.0 09/11/2017 1431   HCT 31.6 (L) 10/02/2023 0659   HCT 38.4 09/11/2017 1431   PLT 205 10/02/2023 0659   PLT 258 09/11/2017 1431   MCV 70.4 (L) 10/02/2023 0659   MCV 75 (L) 09/11/2017 1431   NEUTROABS 4.1 10/01/2023 1033   NEUTROABS 3.9 09/11/2017 1431   LYMPHSABS 2.1 10/01/2023 1033   LYMPHSABS 2.2 09/11/2017 1431   MONOABS 0.6 10/01/2023 1033   EOSABS 0.2 10/01/2023 1033   EOSABS 0.1 09/11/2017 1431   BASOSABS 0.0 10/01/2023 1033   BASOSABS 0.0 09/11/2017 1431    No results found for this or any previous visit (from the past 240 hours).  Studies/Results: No results found.  Medications: Scheduled Meds:  enoxaparin  (LOVENOX ) injection  70 mg Subcutaneous Q24H   HYDROmorphone    Intravenous Q4H   ketorolac   15 mg Intravenous Q6H   methadone   20 mg Oral Daily  And   methadone   10 mg Oral BID   senna-docusate  1 tablet Oral BID   Continuous Infusions: PRN Meds:.diphenhydrAMINE , naloxone  **AND** sodium chloride  flush, ondansetron  (ZOFRAN ) IV, oxycodone , polyethylene glycol  Consultants: None  Procedures: None  Antibiotics: None  Assessment/Plan: Principal Problem:   Sickle cell anemia with pain (HCC) Active Problems:   Leukocytosis   Chronic pain syndrome   Obesity, morbid, BMI 50 or higher (HCC)   Hb Sickle Cell Disease with Pain crisis: Continue IVF 0.45% Saline @ KVO, continue weight based Dilaudid  PCA, IV Toradol  15 mg Q 6 H for a total of 5 days,  continue oral home pain medications as ordered. Monitor vitals very closely, Re-evaluate pain scale regularly, 2 L of Oxygen  by [place]. Patient encouraged to ambulate on the hallway today.  Leukocytosis: Stable Anemia of Chronic Disease:  Hgb within patients base line.  Chronic pain Syndrome: Continue oral home pain medication.  Morbid obesity: Encouraged life style modification, weight loss, diet and exercise    Code Status: Full Code Family Communication: N/A Disposition Plan: Not yet ready for discharge  Homer CHRISTELLA Cover  NP   If 7PM-7AM, please contact night-coverage.  10/02/2023, 2:38 PM  LOS: 1 day

## 2023-10-03 DIAGNOSIS — D5700 Hb-SS disease with crisis, unspecified: Secondary | ICD-10-CM | POA: Diagnosis not present

## 2023-10-03 LAB — CBC
HCT: 30.6 % — ABNORMAL LOW (ref 36.0–46.0)
Hemoglobin: 9.9 g/dL — ABNORMAL LOW (ref 12.0–15.0)
MCH: 22.8 pg — ABNORMAL LOW (ref 26.0–34.0)
MCHC: 32.4 g/dL (ref 30.0–36.0)
MCV: 70.3 fL — ABNORMAL LOW (ref 80.0–100.0)
Platelets: 227 K/uL (ref 150–400)
RBC: 4.35 MIL/uL (ref 3.87–5.11)
RDW: 15.6 % — ABNORMAL HIGH (ref 11.5–15.5)
WBC: 11.2 K/uL — ABNORMAL HIGH (ref 4.0–10.5)
nRBC: 0.4 % — ABNORMAL HIGH (ref 0.0–0.2)

## 2023-10-03 MED ORDER — ORAL CARE MOUTH RINSE: 15.0000 mL | OROMUCOSAL | Status: DC | PRN

## 2023-10-03 MED ORDER — OXYMETAZOLINE HCL 0.05 % NA SOLN
1.0000 | Freq: Two times a day (BID) | NASAL | Status: DC
  Administered 2023-10-03: 1 via NASAL
  Filled 2023-10-03: qty 15

## 2023-10-03 NOTE — Progress Notes (Signed)
   10/03/23 1255  TOC Brief Assessment  Insurance and Status Reviewed  Patient has primary care physician Yes  Home environment has been reviewed home w/ spouse  Prior level of function: independent  Prior/Current Home Services No current home services  Social Drivers of Health Review SDOH reviewed no interventions necessary  Readmission risk has been reviewed Yes  Transition of care needs no transition of care needs at this time

## 2023-10-03 NOTE — Progress Notes (Signed)
 Patient ID: Stephanie Burch, female   DOB: 1980-06-10, 43 y.o.   MRN: [id number] Subjective: Stephanie Burch  is a 43 y.o. female with history of sickle cell disease, chronic pain syndrome, opiate dependence and tolerance, obesity, and anemia of chronic disease presents to the emergency department with complaints of generalized pain, that is consistent with her typical pain crisis. Patient was admitted for sickle cell crisis.  Patient has no new complaint today.  She says she feels slightly better but she thinks she needs 1 more night in the hospital.  Her pain is a 7/10 this morning, characterized as throbbing and achy.  She denies any cough, fever, chest pain, nausea, vomiting or diarrhea.  No urinary symptoms.  Objective:  Vital signs in last 24 hours:  Vitals:   10/03/23 0828 10/03/23 1021 10/03/23 1055 10/03/23 1511  BP:  129/70  114/72  Pulse:  87  85  Resp: 11 14 11 14   Temp:  97.7 F (36.5 C)  98.5 F (36.9 C)  TempSrc:  Oral  Oral  SpO2:  94% 100% 96%  Weight:      Height:        Intake/Output from previous day:   Intake/Output Summary (Last 24 hours) at 10/03/2023 1558 Last data filed at 10/03/2023 1000 Gross per 24 hour  Intake 240 ml  Output --  Net 240 ml    Physical Exam: General: Alert, awake, oriented x3, in no acute distress.  Morbidly obese. HEENT: [place]/AT PEERL, EOMI Neck: Trachea midline,  no masses, no thyromegal,y no JVD, no carotid bruit OROPHARYNX:  Moist, No exudate/ erythema/lesions.  Heart: Regular rate and rhythm, without murmurs, rubs, gallops, PMI non-displaced, no heaves or thrills on palpation.  Lungs: Clear to auscultation, no wheezing or rhonchi noted. No increased vocal fremitus resonant to percussion  Abdomen: Soft, nontender, nondistended, positive bowel sounds, no masses no hepatosplenomegaly noted..  Neuro: No focal neurological deficits noted cranial nerves II through XII grossly intact. DTRs 2+ bilaterally upper and lower extremities.  Strength 5 out of 5 in bilateral upper and lower extremities. Musculoskeletal: No warm swelling or erythema around joints, no spinal tenderness noted. Psychiatric: Patient alert and oriented x3, good insight and cognition, good recent to remote recall. Lymph node survey: No cervical axillary or inguinal lymphadenopathy noted.  Lab Results:  Basic Metabolic Panel:    Component Value Date/Time   NA 143 10/01/2023 1033   NA 142 09/11/2017 1431   K 4.3 10/01/2023 1033   CL 108 10/01/2023 1033   CO2 24 10/01/2023 1033   BUN 6 10/01/2023 1033   BUN 8 09/11/2017 1431   CREATININE 0.67 10/01/2023 1033   GLUCOSE 104 (H) 10/01/2023 1033   CALCIUM 9.6 10/01/2023 1033   CBC:    Component Value Date/Time   WBC 11.2 (H) 10/03/2023 0736   HGB 9.9 (L) 10/03/2023 0736   HGB 13.0 09/11/2017 1431   HCT 30.6 (L) 10/03/2023 0736   HCT 38.4 09/11/2017 1431   PLT 227 10/03/2023 0736   PLT 258 09/11/2017 1431   MCV 70.3 (L) 10/03/2023 0736   MCV 75 (L) 09/11/2017 1431   NEUTROABS 4.1 10/01/2023 1033   NEUTROABS 3.9 09/11/2017 1431   LYMPHSABS 2.1 10/01/2023 1033   LYMPHSABS 2.2 09/11/2017 1431   MONOABS 0.6 10/01/2023 1033   EOSABS 0.2 10/01/2023 1033   EOSABS 0.1 09/11/2017 1431   BASOSABS 0.0 10/01/2023 1033   BASOSABS 0.0 09/11/2017 1431    No results found for this or any  previous visit (from the past 240 hours).  Studies/Results: No results found.  Medications: Scheduled Meds:  enoxaparin  (LOVENOX ) injection  70 mg Subcutaneous Q24H   HYDROmorphone    Intravenous Q4H   ketorolac   15 mg Intravenous Q6H   methadone   20 mg Oral Daily   And   methadone   10 mg Oral BID   senna-docusate  1 tablet Oral BID   Continuous Infusions: PRN Meds:.diphenhydrAMINE , naloxone  **AND** sodium chloride  flush, ondansetron  (ZOFRAN ) IV, mouth rinse, oxycodone , polyethylene glycol  Consultants: None  Procedures: None  Antibiotics: None  Assessment/Plan: Principal Problem:   Sickle cell  anemia with pain (HCC) Active Problems:   Leukocytosis   Chronic pain syndrome   Obesity, morbid, BMI 50 or higher (HCC)  Hb Sickle Cell Disease with Pain crisis: IV fluid at KVO, continue weight based Dilaudid  PCA at current dose setting, continue IV Toradol  15 mg Q 6 H for a total of 5 days, continue oral home pain medications as ordered. Monitor vitals very closely, Re-evaluate pain scale regularly, 2 L of Oxygen  by [place]. Patient encouraged to ambulate on the hallway today.  Leukocytosis: Very mild.  Most likely due to vaso-occlusive crisis.  Will continue to monitor closely. Anemia of Chronic Disease: Hemoglobin is stable at baseline today.  There is no clinical indication for blood transfusion at this time.  Will monitor closely and transfuse as appropriate. Chronic pain Syndrome: Continue oral home pain medications as ordered. Morbid obesity: Patient counseled extensively about nutrition and lifestyle modification.  Code Status: Full Code Family Communication: N/A Disposition Plan: For possible discharge home tomorrow morning, 10/04/2023. Stephanie Burch  If 7PM-7AM, please contact night-coverage.  10/03/2023, 3:58 PM  LOS: 2 days

## 2023-10-03 NOTE — Plan of Care (Signed)

## 2023-10-04 DIAGNOSIS — D5700 Hb-SS disease with crisis, unspecified: Secondary | ICD-10-CM | POA: Diagnosis not present

## 2023-10-04 LAB — COMPREHENSIVE METABOLIC PANEL WITH GFR
ALT: 14 U/L (ref 0–44)
AST: 24 U/L (ref 15–41)
Albumin: 3.9 g/dL (ref 3.5–5.0)
Alkaline Phosphatase: 84 U/L (ref 38–126)
Anion gap: 9 (ref 5–15)
BUN: 9 mg/dL (ref 6–20)
CO2: 24 mmol/L (ref 22–32)
Calcium: 9.1 mg/dL (ref 8.9–10.3)
Chloride: 108 mmol/L (ref 98–111)
Creatinine, Ser: 0.59 mg/dL (ref 0.44–1.00)
GFR, Estimated: >60 mL/min (ref >60)
Glucose, Bld: 88 mg/dL (ref 70–99)
Potassium: 4.8 mmol/L (ref 3.5–5.1)
Sodium: 141 mmol/L (ref 135–145)
Total Bilirubin: 0.8 mg/dL (ref 0.0–1.2)
Total Protein: 7 g/dL (ref 6.5–8.1)

## 2023-10-04 LAB — GROUP A STREP BY PCR: Group A Strep by PCR: NOT DETECTED

## 2023-10-04 LAB — RESP PANEL BY RT-PCR (RSV, FLU A&B, COVID)  RVPGX2
Influenza A by PCR: NEGATIVE
Influenza B by PCR: NEGATIVE
Resp Syncytial Virus by PCR: NEGATIVE
SARS Coronavirus 2 by RT PCR: NEGATIVE

## 2023-10-04 LAB — CBC
HCT: 31.1 % — ABNORMAL LOW (ref 36.0–46.0)
Hemoglobin: 10.2 g/dL — ABNORMAL LOW (ref 12.0–15.0)
MCH: 23 pg — ABNORMAL LOW (ref 26.0–34.0)
MCHC: 32.8 g/dL (ref 30.0–36.0)
MCV: 70.2 fL — ABNORMAL LOW (ref 80.0–100.0)
Platelets: 281 K/uL (ref 150–400)
RBC: 4.43 MIL/uL (ref 3.87–5.11)
RDW: 15.9 % — ABNORMAL HIGH (ref 11.5–15.5)
WBC: 13 K/uL — ABNORMAL HIGH (ref 4.0–10.5)
nRBC: 0.9 % — ABNORMAL HIGH (ref 0.0–0.2)

## 2023-10-04 MED ORDER — MENTHOL 3 MG MT LOZG
1.0000 | LOZENGE | OROMUCOSAL | Status: DC | PRN
  Administered 2023-10-04: 3 mg via ORAL
  Filled 2023-10-04: qty 9

## 2023-10-04 MED ORDER — PHENOL 1.4 % MT LIQD: 1.0000 | OROMUCOSAL | Status: DC | PRN

## 2023-10-04 NOTE — Plan of Care (Signed)
 Patient IV removed, AVS delivered and gone over with patient. Patient walked down to discharge lounge with all belongings.

## 2023-10-04 NOTE — Discharge Summary (Signed)
 Physician Discharge Summary  Stephanie Burch FMW:[id number] DOB: 18-Oct-1980 DOA: 10/01/2023  PCP: Pcp, No  Admit date: 10/01/2023  Discharge date: 10/04/2023  Discharge Diagnoses:  Principal Problem:   Sickle cell anemia with pain (HCC) Active Problems:   Leukocytosis   Chronic pain syndrome   Obesity, morbid, BMI 50 or higher (HCC)   Discharge Condition: Stable  Disposition:  Pt is discharged home in good condition and is to follow up with Pcp, No this week to have labs evaluated. Stephanie Burch is instructed to increase activity slowly and balance with rest for the next few days, and use prescribed medication to complete treatment of pain  Diet: Regular Wt Readings from Last 3 Encounters:  10/01/23 (!) 142.9 kg  09/27/23 (!) 142.9 kg  09/03/23 (!) 142.9 kg    History of present illness:  Stephanie Burch  is a 43 y.o. female with history of sickle cell disease, chronic pain syndrome, opiate dependence and tolerance, obesity, and anemia of chronic disease presents to the emergency department with complaints of generalized pain, that is consistent with her typical pain crisis. Patient says that pain intensity has been elevated over the past few days and unrelieved by her home medications. Pain is worse in bilateral extremities and back. Patient last had oxycodone  this AM without sustained relief. Patient rates pain as 9/10, constant and throbbing. Patient denies fever, chills, chest pain, shortness of breath, nausea, vomiting, or diarrhea. No sick contacts or recent travel no urinary symptoms.    ED Course:  Patient presented to the ED with uncontrolled pain, she was treated with IVF, IV pain medication with no resolution to symptoms. Patient is admitted for unresolved pain and sickle cell pain management.  BP 115/78  Pulse 88  Temp 98.2 F (36.8 C) (Oral)  Resp 16  Ht 5' 5 (1.651 m)  Wt (!) 142.9 kg  LMP 09/23/2023  SpO2 96%  BMI 52.42 kg/m   Hospital Course:  Patient was  admitted for sickle cell pain crisis and managed appropriately with IVF, IV Dilaudid  via PCA and IV Toradol , as well as other adjunct therapies per sickle cell pain management protocols.  Patient did well on this regimen with significant improvement in her pain back to baseline.  Her hemoglobin remained stable throughout this admission, she did not require blood transfusion.  She was counseled extensively about lifestyle modification, weight loss and diet and exercise as part of means to reduce body weight, considering her BMI of 52.4 kg/m.  Patient verbalized understanding.  As at today, patient is tolerating p.o. intake with no significant restrictions and ambulating well with no significant pain.  She has some nasal congestion for which nasal spray was initiated with significant improvement.  She was screened for URI and rapid strep which were negative.  Patient feels ready to be discharged home at this time.  Patient was therefore discharged home today in a hemodynamically stable condition.   Casi will follow-up with PCP within 1 week of this discharge. Elke was counseled extensively about nonpharmacologic means of pain management, patient verbalized understanding and was appreciative of  the care received during this admission.   We discussed the need for good hydration, monitoring of hydration status, avoidance of heat, cold, stress, and infection triggers. We discussed the need to be adherent with taking Hydrea  and other home medications. Patient was reminded of the need to seek medical attention immediately if any symptom of bleeding, anemia, or infection occurs.  Discharge Exam: Vitals:   10/04/23  1124 10/04/23 1225  BP:  129/85  Pulse:  92  Resp: 15 18  Temp:  (!) 97.5 F (36.4 C)  SpO2:  95%   Vitals:   10/04/23 0846 10/04/23 0944 10/04/23 1124 10/04/23 1225  BP:    129/85  Pulse:    92  Resp: 11 11 15 18   Temp:    (!) 97.5 F (36.4 C)  TempSrc:    Oral  SpO2:    95%   Weight:      Height:        General appearance : Awake, alert, not in any distress. Speech Clear. Not toxic looking, morbidly obese. HEENT: Atraumatic and Normocephalic, pupils equally reactive to light and accomodation Neck: Supple, no JVD. No cervical lymphadenopathy.  Chest: Good air entry bilaterally, no added sounds  CVS: S1 S2 regular, no murmurs.  Abdomen: Bowel sounds present, Non tender and not distended with no gaurding, rigidity or rebound. Extremities: B/L Lower Ext shows no edema, both legs are warm to touch Neurology: Awake alert, and oriented X 3, CN II-XII intact, Non focal Skin: No Rash  Discharge Instructions  Discharge Instructions     Diet - low sodium heart healthy   Complete by: As directed    Increase activity slowly   Complete by: As directed       Allergies as of 10/04/2023   No Known Allergies      Medication List     TAKE these medications    acetaminophen  500 MG tablet Commonly known as: TYLENOL  Take 1,000 mg by mouth every 6 (six) hours as needed for mild pain (pain score 1-3) or headache.   ibuprofen  200 MG tablet Commonly known as: ADVIL  Take 800 mg by mouth every 6 (six) hours as needed for mild pain (pain score 1-3) or headache.   methadone  10 MG tablet Commonly known as: DOLOPHINE  Take 10-20 mg by mouth See admin instructions. Take 20 mg by mouth in the morning and 10 mg midday & at bedtime   naloxone  4 MG/0.1ML Liqd nasal spray kit Commonly known as: NARCAN  Use as needed for opiod overdose   oxycodone  30 MG immediate release tablet Commonly known as: ROXICODONE  Take 30 mg by mouth 6 (six) times daily.   promethazine  25 MG tablet Commonly known as: PHENERGAN  Take 25 mg by mouth every 8 (eight) hours as needed for nausea or vomiting.   valACYclovir  1000 MG tablet Commonly known as: VALTREX  Take 1,000 mg by mouth daily.        The results of significant diagnostics from this hospitalization (including imaging,  microbiology, ancillary and laboratory) are listed below for reference.    Significant Diagnostic Studies: No results found.  Microbiology: Recent Results (from the past 240 hours)  Group A Strep by PCR     Status: None   Collection Time: 10/04/23 12:01 PM   Specimen: Anterior Nasal Swab; Sterile Swab  Result Value Ref Range Status   Group A Strep by PCR NOT DETECTED NOT DETECTED Final    Comment: Performed at [facility], [address], [place], [place] [address]     Labs: Basic Metabolic Panel: Recent Labs  Lab 10/01/23 1033 10/04/23 0657  NA 143 141  K 4.3 4.8  CL 108 108  CO2 24 24  GLUCOSE 104* 88  BUN 6 9  CREATININE 0.67 0.59  CALCIUM 9.6 9.1   Liver Function Tests: Recent Labs  Lab 10/01/23 1033 10/04/23 0657  AST 13* 24  ALT  10 14  ALKPHOS 86 84  BILITOT 0.7 0.8  PROT 7.6 7.0  ALBUMIN 4.1 3.9   No results for input(s): LIPASE, AMYLASE in the last 168 hours. No results for input(s): AMMONIA in the last 168 hours. CBC: Recent Labs  Lab 10/01/23 1033 10/02/23 0659 10/03/23 0736 10/04/23 0657  WBC 7.0 7.7 11.2* 13.0*  NEUTROABS 4.1  --   --   --   HGB 11.7* 10.5* 9.9* 10.2*  HCT 35.9* 31.6* 30.6* 31.1*  MCV 70.0* 70.4* 70.3* 70.2*  PLT 256 205 227 281   Cardiac Enzymes: No results for input(s): CKTOTAL, CKMB, CKMBINDEX, TROPONINI in the last 168 hours. BNP: Invalid input(s): POCBNP CBG: No results for input(s): GLUCAP in the last 168 hours.  Time coordinating discharge: 50 minutes  Signed:  Latajah Thuman  [facility] 10/04/2023, 12:37 PM

## 2023-11-02 ENCOUNTER — Inpatient Hospital Stay (HOSPITAL_COMMUNITY)
Admission: AD | Admit: 2023-11-02 | Discharge: 2023-11-05 | DRG: 812 | Disposition: A | Discharge status: Home / Self Care | Source: Ambulatory Visit | Attending: Internal Medicine | Admitting: Internal Medicine

## 2023-11-02 ENCOUNTER — Telehealth (HOSPITAL_COMMUNITY): Payer: Self-pay | Admitting: *Deleted

## 2023-11-02 ENCOUNTER — Other Ambulatory Visit: Payer: Self-pay

## 2023-11-02 DIAGNOSIS — D72829 Elevated white blood cell count, unspecified: Secondary | ICD-10-CM | POA: Diagnosis present

## 2023-11-02 DIAGNOSIS — G894 Chronic pain syndrome: Secondary | ICD-10-CM | POA: Diagnosis present

## 2023-11-02 DIAGNOSIS — D5700 Hb-SS disease with crisis, unspecified: Principal | ICD-10-CM | POA: Diagnosis present

## 2023-11-02 DIAGNOSIS — Z79899 Other long term (current) drug therapy: Secondary | ICD-10-CM

## 2023-11-02 DIAGNOSIS — Z8616 Personal history of COVID-19: Secondary | ICD-10-CM

## 2023-11-02 DIAGNOSIS — D638 Anemia in other chronic diseases classified elsewhere: Secondary | ICD-10-CM | POA: Diagnosis present

## 2023-11-02 DIAGNOSIS — Z6841 Body Mass Index (BMI) 40.0 and over, adult: Secondary | ICD-10-CM

## 2023-11-02 DIAGNOSIS — Z8249 Family history of ischemic heart disease and other diseases of the circulatory system: Secondary | ICD-10-CM

## 2023-11-02 DIAGNOSIS — Z832 Family history of diseases of the blood and blood-forming organs and certain disorders involving the immune mechanism: Secondary | ICD-10-CM

## 2023-11-02 DIAGNOSIS — Z833 Family history of diabetes mellitus: Secondary | ICD-10-CM

## 2023-11-02 LAB — COMPREHENSIVE METABOLIC PANEL WITH GFR
ALT: 11 U/L (ref 0–44)
AST: 19 U/L (ref 15–41)
Albumin: 4 g/dL (ref 3.5–5.0)
Alkaline Phosphatase: 82 U/L (ref 38–126)
Anion gap: 11 (ref 5–15)
BUN: <5 mg/dL — ABNORMAL LOW (ref 6–20)
CO2: 25 mmol/L (ref 22–32)
Calcium: 9.7 mg/dL (ref 8.9–10.3)
Chloride: 106 mmol/L (ref 98–111)
Creatinine, Ser: 0.52 mg/dL (ref 0.44–1.00)
GFR, Estimated: >60 mL/min (ref >60)
Glucose, Bld: 102 mg/dL — ABNORMAL HIGH (ref 70–99)
Potassium: 4.3 mmol/L (ref 3.5–5.1)
Sodium: 141 mmol/L (ref 135–145)
Total Bilirubin: 0.5 mg/dL (ref 0.0–1.2)
Total Protein: 7.5 g/dL (ref 6.5–8.1)

## 2023-11-02 LAB — CBC WITH DIFFERENTIAL/PLATELET
Abs Immature Granulocytes: 0.03 K/uL (ref 0.00–0.07)
Basophils Absolute: 0 K/uL (ref 0.0–0.1)
Basophils Relative: 0 %
Eosinophils Absolute: 0.2 K/uL (ref 0.0–0.5)
Eosinophils Relative: 2 %
HCT: 34 % — ABNORMAL LOW (ref 36.0–46.0)
Hemoglobin: 11.1 g/dL — ABNORMAL LOW (ref 12.0–15.0)
Immature Granulocytes: 0 %
Lymphocytes Relative: 23 %
Lymphs Abs: 1.8 K/uL (ref 0.7–4.0)
MCH: 23.4 pg — ABNORMAL LOW (ref 26.0–34.0)
MCHC: 32.6 g/dL (ref 30.0–36.0)
MCV: 71.6 fL — ABNORMAL LOW (ref 80.0–100.0)
Monocytes Absolute: 0.6 K/uL (ref 0.1–1.0)
Monocytes Relative: 8 %
Neutro Abs: 5.3 K/uL (ref 1.7–7.7)
Neutrophils Relative %: 67 %
Platelets: 265 K/uL (ref 150–400)
RBC: 4.75 MIL/uL (ref 3.87–5.11)
RDW: 15.9 % — ABNORMAL HIGH (ref 11.5–15.5)
WBC: 7.9 K/uL (ref 4.0–10.5)
nRBC: 0.3 % — ABNORMAL HIGH (ref 0.0–0.2)

## 2023-11-02 LAB — RETICULOCYTES
Immature Retic Fract: 33.5 % — ABNORMAL HIGH (ref 2.3–15.9)
RBC.: 4.68 MIL/uL (ref 3.87–5.11)
Retic Count, Absolute: 173.6 K/uL (ref 19.0–186.0)
Retic Ct Pct: 3.7 % — ABNORMAL HIGH (ref 0.4–3.1)

## 2023-11-02 LAB — LACTATE DEHYDROGENASE: LDH: 209 U/L — ABNORMAL HIGH (ref 98–192)

## 2023-11-02 MED ORDER — HYDROMORPHONE 1 MG/ML IV SOLN
INTRAVENOUS | Status: DC
  Administered 2023-11-02: 30 mg via INTRAVENOUS
  Administered 2023-11-02: 11.5 mg via INTRAVENOUS
  Administered 2023-11-02: 30 mg via INTRAVENOUS
  Administered 2023-11-03: 8 mg via INTRAVENOUS
  Administered 2023-11-03 (×2): 7.5 mg via INTRAVENOUS
  Administered 2023-11-04: 30 mg via INTRAVENOUS
  Administered 2023-11-04: 11.5 mg via INTRAVENOUS
  Administered 2023-11-04: 10 mg via INTRAVENOUS
  Administered 2023-11-04: 9.5 mg via INTRAVENOUS
  Administered 2023-11-05: 17.5 mg via INTRAVENOUS
  Administered 2023-11-05: 30 mg via INTRAVENOUS
  Filled 2023-11-02 (×6): qty 30

## 2023-11-02 MED ORDER — PROMETHAZINE HCL 25 MG PO TABS: 25.0000 mg | ORAL_TABLET | Freq: Three times a day (TID) | ORAL | Status: DC | PRN

## 2023-11-02 MED ORDER — KETOROLAC TROMETHAMINE 15 MG/ML IJ SOLN
15.0000 mg | Freq: Four times a day (QID) | INTRAMUSCULAR | Status: DC
  Administered 2023-11-03 (×3): 15 mg via INTRAVENOUS
  Filled 2023-11-02 (×2): qty 1

## 2023-11-02 MED ORDER — ACETAMINOPHEN 500 MG PO TABS
1000.0000 mg | ORAL_TABLET | Freq: Once | ORAL | Status: DC
  Filled 2023-11-02: qty 2

## 2023-11-02 MED ORDER — POLYETHYLENE GLYCOL 3350 17 G PO PACK
17.0000 g | PACK | Freq: Every day | ORAL | Status: DC | PRN
  Filled 2023-11-02: qty 1

## 2023-11-02 MED ORDER — METHADONE HCL 5 MG PO TABS
10.0000 mg | ORAL_TABLET | Freq: Three times a day (TID) | ORAL | Status: DC
  Administered 2023-11-02 – 2023-11-05 (×8): 10 mg via ORAL
  Filled 2023-11-02 (×8): qty 2

## 2023-11-02 MED ORDER — SODIUM CHLORIDE 0.45 % IV SOLN: INTRAVENOUS | Status: AC

## 2023-11-02 MED ORDER — SODIUM CHLORIDE 0.9% FLUSH: 9.0000 mL | INTRAVENOUS | Status: DC | PRN

## 2023-11-02 MED ORDER — NALOXONE HCL 0.4 MG/ML IJ SOLN: 0.4000 mg | INTRAMUSCULAR | Status: DC | PRN

## 2023-11-02 MED ORDER — ENOXAPARIN SODIUM 60 MG/0.6ML IJ SOSY
60.0000 mg | PREFILLED_SYRINGE | Freq: Every day | INTRAMUSCULAR | Status: DC
  Administered 2023-11-02 – 2023-11-05 (×4): 60 mg via SUBCUTANEOUS
  Filled 2023-11-02 (×4): qty 0.6

## 2023-11-02 MED ORDER — METHADONE HCL 5 MG PO TABS
10.0000 mg | ORAL_TABLET | Freq: Every day | ORAL | Status: DC
  Administered 2023-11-03 – 2023-11-05 (×3): 10 mg via ORAL
  Filled 2023-11-02 (×3): qty 2

## 2023-11-02 MED ORDER — DIPHENHYDRAMINE HCL 25 MG PO CAPS
25.0000 mg | ORAL_CAPSULE | ORAL | Status: DC | PRN
  Filled 2023-11-02: qty 1

## 2023-11-02 MED ORDER — ENOXAPARIN SODIUM 40 MG/0.4ML IJ SOSY: 40.0000 mg | PREFILLED_SYRINGE | INTRAMUSCULAR | Status: DC

## 2023-11-02 MED ORDER — ONDANSETRON HCL 4 MG/2ML IJ SOLN: 4.0000 mg | Freq: Four times a day (QID) | INTRAMUSCULAR | Status: DC | PRN

## 2023-11-02 MED ORDER — SENNOSIDES-DOCUSATE SODIUM 8.6-50 MG PO TABS
1.0000 | ORAL_TABLET | Freq: Two times a day (BID) | ORAL | Status: DC
  Filled 2023-11-02 (×6): qty 1

## 2023-11-02 MED ORDER — OXYCODONE HCL 5 MG PO TABS
30.0000 mg | ORAL_TABLET | Freq: Every day | ORAL | Status: DC
  Administered 2023-11-02 – 2023-11-05 (×16): 30 mg via ORAL
  Filled 2023-11-02 (×16): qty 6

## 2023-11-02 MED ORDER — KETOROLAC TROMETHAMINE 15 MG/ML IJ SOLN
15.0000 mg | Freq: Four times a day (QID) | INTRAMUSCULAR | Status: DC
  Administered 2023-11-02 – 2023-11-05 (×9): 15 mg via INTRAVENOUS
  Filled 2023-11-02 (×12): qty 1

## 2023-11-02 NOTE — Progress Notes (Addendum)
 Diagnosis: sickle cell crisis    Provider: Olugbemiga Jegede    Procedure: dilaudid  pca pump    Note: pt has complaints of pain 9.10 bilateral legs.  Pt is on PCA pum 0.5 /10/3.  0.45% NS at 125 ml/hr for hydration.  Pt also received Toradol  15 mg IVP.  Pt is being admitted to inpatient for sickle cell uncontrollable pain, still 9/10 at present .  Setting verified by Therisa PEAK and Rosina HERO RN setting 0.5/10/3

## 2023-11-02 NOTE — H&P (Cosign Needed)
 [facility] History and Physical  Stephanie Burch FMW:[id number] DOB: Jul 13, 1980 DOA: 11/02/2023  PCP: Pcp, No   Chief Complaint:  Chief Complaint  Patient presents with   Sickle Cell Pain Crisis    HPI: Stephanie Burch is a 43 y.o. female with history of sickle cell disease, chronic pain syndrome, opiate dependence and tolerance, obesity, and anemia of chronic disease presents to the emergency department with complaints of generalized pain, that is consistent with her typical pain crisis. Patient says that pain intensity has been elevated over the past few days and unrelieved by her home medications. Pain is worse in bilateral extremities and back. Patient last had oxycodone  this AM without sustained relief. Patient rates pain as 9/10, constant and throbbing. Patient denies fever, chills, chest pain, shortness of breath, nausea, vomiting, or diarrhea. No sick contacts or recent travel no urinary symptoms.   Day Hospital course: Patient presented to the day hospital with complaint of uncontrolled sickle cell pain. She was treated with IV fluid, IV pain medication with no resolution to pain symptoms. Patient is admitted for ongoing sickle cell pain management. Vitals and labs are within patient's baseline.  Systemic Review: General: The patient denies anorexia, fever, weight loss Cardiac: Denies chest pain, syncope, palpitations, pedal edema  Respiratory: Denies cough, shortness of breath, wheezing GI: Denies severe indigestion/heartburn, abdominal pain, nausea, vomiting, diarrhea and constipation GU: Denies hematuria, incontinence, dysuria  Musculoskeletal: Generalize body tenderness.  Skin: Denies suspicious skin lesions Neurologic: Denies focal weakness or numbness, change in vision  Past Medical History:  Diagnosis Date   Acute kidney injury 11/07/2019   Anemia    Chronic pain    COVID-19 virus detected 10/22/2019   Depression    HCAP (healthcare-associated pneumonia)  11/07/2019   History of blood transfusion 2013   x 2   HSV infection    Hypoxia    Pneumonia    x 1   Pulmonary edema 11/11/2019   Sepsis (HCC) 11/07/2019   Sickle cell anemia (HCC) 09/2021   gets them frequently   Tachycardia with heart rate 100-120 beats per minute    Vitamin B12 deficiency 12/2018   Vitamin D  deficiency     Past Surgical History:  Procedure Laterality Date   CESAREAN SECTION  2013   x 1   TOOTH EXTRACTION N/A 10/18/2021   Procedure: DENTAL RESTORATION/EXTRACTIONS;  Surgeon: Sheryle Hamilton, DMD;  Location: [facility] OR;  Service: Oral Surgery;  Laterality: N/A;    No Known Allergies  Family History  Problem Relation Age of Onset   Hypertension Mother    Sickle cell trait Mother    Diabetes Father    Sickle cell trait Father       Prior to Admission medications   Medication Sig Start Date End Date Taking? Authorizing Provider  acetaminophen  (TYLENOL ) 500 MG tablet Take 1,000 mg by mouth every 6 (six) hours as needed for mild pain (pain score 1-3) or headache.    [provider]  ibuprofen  (ADVIL ) 200 MG tablet Take 800 mg by mouth every 6 (six) hours as needed for mild pain (pain score 1-3) or headache.    [provider]  methadone  (DOLOPHINE ) 10 MG tablet Take 10-20 mg by mouth See admin instructions. Take 20 mg by mouth in the morning and 10 mg midday & at bedtime 03/26/23   [provider]  naloxone  (NARCAN ) nasal spray 4 mg/0.1 mL Use as needed for opiod overdose 03/04/22   Paseda, Folashade R, FNP  oxycodone  (ROXICODONE ) 30 MG immediate release tablet Take 30 mg by mouth 6 (six) times daily.    [provider]  promethazine  (PHENERGAN ) 25 MG tablet Take 25 mg by mouth every 8 (eight) hours as needed for nausea or vomiting.    [provider]  valACYclovir  (VALTREX ) 1000 MG tablet Take 1,000 mg by mouth daily. 01/15/23   [provider]     Physical Exam: Vitals:   11/02/23 1132 11/02/23 1243 11/02/23 1543  11/02/23 1616  BP:  118/79  132/77  Pulse:  86  86  Resp: 16 13 17 19   Temp:  97.8 F (36.6 C)  98.4 F (36.9 C)  TempSrc:  Temporal  Oral  SpO2:  96%  96%    General: Alert, awake, afebrile, anicteric, not in obvious distress HEENT: Normocephalic and Atraumatic, Mucous membranes pink                PERRLA; EOM intact; No scleral icterus,                 Nares: Patent, Oropharynx: Clear, Fair Dentition                 Neck: FROM, no cervical lymphadenopathy, thyromegaly, carotid bruit or JVD;  CHEST WALL: No tenderness  CHEST: Normal respiration, clear to auscultation bilaterally  HEART: Regular rate and rhythm; no murmurs rubs or gallops  BACK: No kyphosis or scoliosis; no CVA tenderness  ABDOMEN: Positive Bowel Sounds, soft, non-tender; no masses, no organomegaly EXTREMITIES: No cyanosis, clubbing, or edema SKIN:  no rash or ulceration  CNS: Alert and Oriented x 4, Nonfocal exam, CN 2-12 intact  Labs on Admission:  Basic Metabolic Panel: Recent Labs  Lab 11/02/23 1015  NA 141  K 4.3  CL 106  CO2 25  GLUCOSE 102*  BUN <5*  CREATININE 0.52  CALCIUM 9.7   Liver Function Tests: Recent Labs  Lab 11/02/23 1015  AST 19  ALT 11  ALKPHOS 82  BILITOT 0.5  PROT 7.5  ALBUMIN 4.0   No results for input(s): LIPASE, AMYLASE in the last 168 hours. No results for input(s): AMMONIA in the last 168 hours. CBC: Recent Labs  Lab 11/02/23 1015  WBC 7.9  NEUTROABS 5.3  HGB 11.1*  HCT 34.0*  MCV 71.6*  PLT 265   Cardiac Enzymes: No results for input(s): CKTOTAL, CKMB, CKMBINDEX, TROPONINI in the last 168 hours.  BNP (last 3 results) No results for input(s): BNP in the last 8760 hours.  ProBNP (last 3 results) No results for input(s): PROBNP in the last 8760 hours.  CBG: No results for input(s): GLUCAP in the last 168 hours.   Assessment/Plan Principal Problem:   Sickle cell anemia with pain (HCC)  Admits to the Day Hospital for extended  observation IVF  0.45% Saline @ 50 mls/hour Weight based Dilaudid  PCA started within 30 minutes of admission IV Toradol  15 mg Q 6 H x 1 doses Acetaminophen  1000 mg x 1 dose Labs: CBCD, CMP, Retic Count and LDH Monitor vitals very closely, Re-evaluate pain scale every hour 2 L of Oxygen  by [place] Patient will be re-evaluated for pain in the context of function and relationship to baseline as care progresses. If no significant relieve from pain (remains above 5/10) will transfer patient to inpatient services for further evaluation and management  Code Status: Full  Family Communication: None  DVT Prophylaxis: Ambulate as tolerated   Time spent: 35 Minutes  Homer CHRISTELLA Cover NP  If 7PM-7AM, please contact night-coverage [email or web address] 11/02/2023, 4:44 PM

## 2023-11-02 NOTE — Telephone Encounter (Signed)
 Patient called at 804 am complaints of bilateral leg pain 9/10.  She states took oxycodone  30 mg and methadone  10 mg at 5 am.  Denies any other symptoms. Notified Onjeje NP and patient will be seen today at day hospital.  Called patient and she will come into day hospital to be seen  and she has her own transportation without her driving .

## 2023-11-02 NOTE — Progress Notes (Signed)
 Wasted 3 cc dilauded Wl Rn witnessed

## 2023-11-02 NOTE — Plan of Care (Signed)
  Problem: Self-Care: Goal: Ability to incorporate actions that prevent/reduce pain crisis will improve Outcome: Progressing   Problem: Respiratory: Goal: Pulmonary complications will be avoided or minimized Outcome: Progressing   Problem: Fluid Volume: Goal: Ability to maintain a balanced intake and output will improve Outcome: Progressing   Problem: Sensory: Goal: Pain level will decrease with appropriate interventions Outcome: Progressing   Problem: Health Behavior: Goal: Postive changes in compliance with treatment and prescription regimens will improve Outcome: Progressing   Problem: Clinical Measurements: Goal: Will remain free from infection Outcome: Progressing Goal: Respiratory complications will improve Outcome: Progressing Goal: Cardiovascular complication will be avoided Outcome: Progressing   Problem: Activity: Goal: Risk for activity intolerance will decrease Outcome: Progressing   Problem: Elimination: Goal: Will not experience complications related to bowel motility Outcome: Progressing Goal: Will not experience complications related to urinary retention Outcome: Progressing

## 2023-11-03 DIAGNOSIS — D72829 Elevated white blood cell count, unspecified: Secondary | ICD-10-CM | POA: Diagnosis present

## 2023-11-03 DIAGNOSIS — G894 Chronic pain syndrome: Secondary | ICD-10-CM | POA: Diagnosis present

## 2023-11-03 DIAGNOSIS — Z833 Family history of diabetes mellitus: Secondary | ICD-10-CM | POA: Diagnosis not present

## 2023-11-03 DIAGNOSIS — Z832 Family history of diseases of the blood and blood-forming organs and certain disorders involving the immune mechanism: Secondary | ICD-10-CM | POA: Diagnosis not present

## 2023-11-03 DIAGNOSIS — Z6841 Body Mass Index (BMI) 40.0 and over, adult: Secondary | ICD-10-CM | POA: Diagnosis not present

## 2023-11-03 DIAGNOSIS — Z8616 Personal history of COVID-19: Secondary | ICD-10-CM | POA: Diagnosis not present

## 2023-11-03 DIAGNOSIS — Z79899 Other long term (current) drug therapy: Secondary | ICD-10-CM | POA: Diagnosis not present

## 2023-11-03 DIAGNOSIS — Z8249 Family history of ischemic heart disease and other diseases of the circulatory system: Secondary | ICD-10-CM | POA: Diagnosis not present

## 2023-11-03 DIAGNOSIS — D5700 Hb-SS disease with crisis, unspecified: Secondary | ICD-10-CM | POA: Diagnosis present

## 2023-11-03 DIAGNOSIS — D638 Anemia in other chronic diseases classified elsewhere: Secondary | ICD-10-CM | POA: Diagnosis present

## 2023-11-03 LAB — CBC
HCT: 34.1 % — ABNORMAL LOW (ref 36.0–46.0)
Hemoglobin: 11.3 g/dL — ABNORMAL LOW (ref 12.0–15.0)
MCH: 23.9 pg — ABNORMAL LOW (ref 26.0–34.0)
MCHC: 33.1 g/dL (ref 30.0–36.0)
MCV: 72.1 fL — ABNORMAL LOW (ref 80.0–100.0)
Platelets: 274 K/uL (ref 150–400)
RBC: 4.73 MIL/uL (ref 3.87–5.11)
RDW: 15.9 % — ABNORMAL HIGH (ref 11.5–15.5)
WBC: 9.5 K/uL (ref 4.0–10.5)
nRBC: 0.4 % — ABNORMAL HIGH (ref 0.0–0.2)

## 2023-11-03 NOTE — Progress Notes (Signed)
   11/03/23 0837  TOC Brief Assessment  Insurance and Status Reviewed  Patient has primary care physician No  Home environment has been reviewed single family home  Prior level of function: independent  Prior/Current Home Services No current home services  Social Drivers of Health Review SDOH reviewed no interventions necessary  Readmission risk has been reviewed Yes  Transition of care needs no transition of care needs at this time    Signed: Heather Saltness, MSW, LCSW Clinical Social Worker Inpatient Care Management 11/03/2023 8:38 AM

## 2023-11-03 NOTE — Care Management Obs Status (Signed)
 MEDICARE OBSERVATION STATUS NOTIFICATION   Patient Details  Name: Stephanie Burch MRN: [id number] Date of Birth: 04-26-1980   Medicare Observation Status Notification Given:  Yes    Oswell Say A Khalid Lacko, LCSW 11/03/2023, 11:22 AM

## 2023-11-03 NOTE — Plan of Care (Signed)
  Problem: Sensory: Goal: Pain level will decrease with appropriate interventions Outcome: Progressing   Problem: Health Behavior/Discharge Planning: Goal: Ability to manage health-related needs will improve Outcome: Progressing   Problem: Clinical Measurements: Goal: Cardiovascular complication will be avoided Outcome: Progressing   Problem: Activity: Goal: Risk for activity intolerance will decrease Outcome: Progressing   Problem: Coping: Goal: Level of anxiety will decrease Outcome: Progressing   Problem: Elimination: Goal: Will not experience complications related to urinary retention Outcome: Progressing   Problem: Skin Integrity: Goal: Risk for impaired skin integrity will decrease Outcome: Progressing

## 2023-11-03 NOTE — Plan of Care (Signed)
  Problem: Education: Goal: Knowledge of vaso-occlusive preventative measures will improve Outcome: Progressing   Problem: Pain Managment: Goal: General experience of comfort will improve and/or be controlled Outcome: Progressing   Problem: Safety: Goal: Ability to remain free from injury will improve Outcome: Progressing   Problem: Skin Integrity: Goal: Risk for impaired skin integrity will decrease Outcome: Progressing

## 2023-11-03 NOTE — Progress Notes (Signed)
 Patient ID: Stephanie Burch, female   DOB: 01-11-1981, 43 y.o.   MRN: [id number] Subjective: Stephanie Burch is a 43 y.o. female with history of sickle cell disease, chronic pain syndrome, opiate dependence and tolerance, obesity, and anemia of chronic disease presents to the day hospital with complaints of generalized pain, that is consistent with her typical pain crisis.    Patient is reporting improved pain of 7/10 today. She has no new concerns. Denies subjective fever, no headache, N/V/D. No urinary symptoms.   Objective:  Vital signs in last 24 hours:  Vitals:   11/03/23 0427 11/03/23 0822 11/03/23 1149 11/03/23 1223  BP:    (!) 147/74  Pulse:    99  Resp: 12 13 15 16   Temp:    98.4 F (36.9 C)  TempSrc:    Oral  SpO2:  100% 99% 98%    Intake/Output from previous day:   Intake/Output Summary (Last 24 hours) at 11/03/2023 1250 Last data filed at 11/02/2023 1700 Gross per 24 hour  Intake 667.82 ml  Output --  Net 667.82 ml    Physical Exam: General: Alert, awake, oriented x3, in no acute distress.  HEENT: [place]/AT PEERL, EOMI Neck: Trachea midline,  no masses, no thyromegal,y no JVD, no carotid bruit OROPHARYNX:  Moist, No exudate/ erythema/lesions.  Heart: Regular rate and rhythm, without murmurs, rubs, gallops, PMI non-displaced, no heaves or thrills on palpation.  Lungs: Clear to auscultation, no wheezing or rhonchi noted. No increased vocal fremitus resonant to percussion  Abdomen: Soft, nontender, nondistended, positive bowel sounds, no masses no hepatosplenomegaly noted..  Neuro: No focal neurological deficits noted cranial nerves II through XII grossly intact. DTRs 2+ bilaterally upper and lower extremities. Strength 5 out of 5 in bilateral upper and lower extremities. Musculoskeletal: Generalize body tenderness  Psychiatric: Patient alert and oriented x3, good insight and cognition, good recent to remote recall. Lymph node survey: No cervical axillary or inguinal  lymphadenopathy noted.  Lab Results:  Basic Metabolic Panel:    Component Value Date/Time   NA 141 11/02/2023 1015   NA 142 09/11/2017 1431   K 4.3 11/02/2023 1015   CL 106 11/02/2023 1015   CO2 25 11/02/2023 1015   BUN <5 (L) 11/02/2023 1015   BUN 8 09/11/2017 1431   CREATININE 0.52 11/02/2023 1015   GLUCOSE 102 (H) 11/02/2023 1015   CALCIUM 9.7 11/02/2023 1015   CBC:    Component Value Date/Time   WBC 9.5 11/03/2023 0900   HGB 11.3 (L) 11/03/2023 0900   HGB 13.0 09/11/2017 1431   HCT 34.1 (L) 11/03/2023 0900   HCT 38.4 09/11/2017 1431   PLT 274 11/03/2023 0900   PLT 258 09/11/2017 1431   MCV 72.1 (L) 11/03/2023 0900   MCV 75 (L) 09/11/2017 1431   NEUTROABS 5.3 11/02/2023 1015   NEUTROABS 3.9 09/11/2017 1431   LYMPHSABS 1.8 11/02/2023 1015   LYMPHSABS 2.2 09/11/2017 1431   MONOABS 0.6 11/02/2023 1015   EOSABS 0.2 11/02/2023 1015   EOSABS 0.1 09/11/2017 1431   BASOSABS 0.0 11/02/2023 1015   BASOSABS 0.0 09/11/2017 1431    No results found for this or any previous visit (from the past 240 hours).  Studies/Results: No results found.  Medications: Scheduled Meds:  enoxaparin  (LOVENOX ) injection  60 mg Subcutaneous Daily   HYDROmorphone    Intravenous Q4H   ketorolac   15 mg Intravenous Q6H   ketorolac   15 mg Intravenous Q6H   methadone   10 mg Oral TID  methadone   10 mg Oral Daily   oxycodone   30 mg Oral 6 X Daily   senna-docusate  1 tablet Oral BID   Continuous Infusions:  sodium chloride  50 mL/hr at 11/02/23 1942   PRN Meds:.diphenhydrAMINE , naloxone  **AND** sodium chloride  flush, ondansetron  (ZOFRAN ) IV, polyethylene glycol, promethazine   Consultants: None  Procedures: None  Antibiotics: None  Assessment/Plan: Principal Problem:   Sickle cell anemia with pain (HCC) Active Problems:   Chronic pain syndrome   Anemia of chronic disease   Hb Sickle Cell Disease with Pain crisis: Continue IVF 0.45% Saline @ KVO, continue weight based Dilaudid   PCA, IV Toradol  15 mg Q 6 H for a total of 5 days, continue oral home pain medications as ordered. Monitor vitals very closely, Re-evaluate pain scale regularly, 2 L of Oxygen  by [place]. Patient encouraged to ambulate on the hallway today.  Leukocytosis: Stable Anemia of Chronic Disease: Hgb within patients baseline. Will continue to monitor daily CBC Chronic pain Syndrome: Continue oral home pain medication    Code Status: Full Code Family Communication: N/A Disposition Plan: Not yet ready for discharge  Homer CHRISTELLA Cover NP   If 7PM-7AM, please contact night-coverage.  11/03/2023, 12:50 PM  LOS: 0 days

## 2023-11-04 LAB — CBC
HCT: 31.8 % — ABNORMAL LOW (ref 36.0–46.0)
Hemoglobin: 10.2 g/dL — ABNORMAL LOW (ref 12.0–15.0)
MCH: 23 pg — ABNORMAL LOW (ref 26.0–34.0)
MCHC: 32.1 g/dL (ref 30.0–36.0)
MCV: 71.6 fL — ABNORMAL LOW (ref 80.0–100.0)
Platelets: 225 K/uL (ref 150–400)
RBC: 4.44 MIL/uL (ref 3.87–5.11)
RDW: 15.8 % — ABNORMAL HIGH (ref 11.5–15.5)
WBC: 8.5 K/uL (ref 4.0–10.5)
nRBC: 0.2 % (ref 0.0–0.2)

## 2023-11-04 NOTE — Progress Notes (Signed)
 Patient ID: Stephanie Burch, female   DOB: 1980-04-04, 43 y.o.   MRN: [id number] Subjective: Stephanie Burch is a 43 y.o. female with history of sickle cell disease, chronic pain syndrome, opiate dependence and tolerance, obesity, and anemia of chronic disease presents to the day hospital with complaints of generalized pain, that is consistent with her typical pain crisis.    Patient is reporting improved pain of 7-6/10 today. She has no new concerns. Denies subjective fever, no headache, N/V/D. No urinary symptoms.   Objective:  Vital signs in last 24 hours:  Vitals:   11/04/23 0317 11/04/23 0404 11/04/23 0801 11/04/23 1218  BP: (!) 153/84     Pulse: 90     Resp: 17 16 18 14   Temp: 98.4 F (36.9 C)     TempSrc: Oral     SpO2: 94% 94% 96%     Intake/Output from previous day:   Intake/Output Summary (Last 24 hours) at 11/04/2023 1327 Last data filed at 11/03/2023 1759 Gross per 24 hour  Intake 1085.69 ml  Output --  Net 1085.69 ml    Physical Exam: General: Alert, awake, oriented x3, in no acute distress.  HEENT: [place]/AT PEERL, EOMI Neck: Trachea midline,  no masses, no thyromegal,y no JVD, no carotid bruit OROPHARYNX:  Moist, No exudate/ erythema/lesions.  Heart: Regular rate and rhythm, without murmurs, rubs, gallops, PMI non-displaced, no heaves or thrills on palpation.  Lungs: Clear to auscultation, no wheezing or rhonchi noted. No increased vocal fremitus resonant to percussion  Abdomen: Soft, nontender, nondistended, positive bowel sounds, no masses no hepatosplenomegaly noted..  Neuro: No focal neurological deficits noted cranial nerves II through XII grossly intact. DTRs 2+ bilaterally upper and lower extremities. Strength 5 out of 5 in bilateral upper and lower extremities. Musculoskeletal: Generalize body tenderness  Psychiatric: Patient alert and oriented x3, good insight and cognition, good recent to remote recall. Lymph node survey: No cervical axillary or inguinal  lymphadenopathy noted.  Lab Results:  Basic Metabolic Panel:    Component Value Date/Time   NA 141 11/02/2023 1015   NA 142 09/11/2017 1431   K 4.3 11/02/2023 1015   CL 106 11/02/2023 1015   CO2 25 11/02/2023 1015   BUN <5 (L) 11/02/2023 1015   BUN 8 09/11/2017 1431   CREATININE 0.52 11/02/2023 1015   GLUCOSE 102 (H) 11/02/2023 1015   CALCIUM 9.7 11/02/2023 1015   CBC:    Component Value Date/Time   WBC 8.5 11/04/2023 0513   HGB 10.2 (L) 11/04/2023 0513   HGB 13.0 09/11/2017 1431   HCT 31.8 (L) 11/04/2023 0513   HCT 38.4 09/11/2017 1431   PLT 225 11/04/2023 0513   PLT 258 09/11/2017 1431   MCV 71.6 (L) 11/04/2023 0513   MCV 75 (L) 09/11/2017 1431   NEUTROABS 5.3 11/02/2023 1015   NEUTROABS 3.9 09/11/2017 1431   LYMPHSABS 1.8 11/02/2023 1015   LYMPHSABS 2.2 09/11/2017 1431   MONOABS 0.6 11/02/2023 1015   EOSABS 0.2 11/02/2023 1015   EOSABS 0.1 09/11/2017 1431   BASOSABS 0.0 11/02/2023 1015   BASOSABS 0.0 09/11/2017 1431    No results found for this or any previous visit (from the past 240 hours).  Studies/Results: No results found.  Medications: Scheduled Meds:  enoxaparin  (LOVENOX ) injection  60 mg Subcutaneous Daily   HYDROmorphone    Intravenous Q4H   ketorolac   15 mg Intravenous Q6H   methadone   10 mg Oral TID   methadone   10 mg Oral Daily  oxycodone   30 mg Oral 6 X Daily   senna-docusate  1 tablet Oral BID   Continuous Infusions:   PRN Meds:.diphenhydrAMINE , naloxone  **AND** sodium chloride  flush, ondansetron  (ZOFRAN ) IV, polyethylene glycol, promethazine   Consultants: None  Procedures: None  Antibiotics: None  Assessment/Plan: Principal Problem:   Sickle cell anemia with pain (HCC) Active Problems:   Chronic pain syndrome   Anemia of chronic disease   Obesity, morbid, BMI 50 or higher (HCC)   Hb Sickle Cell Disease with Pain crisis: Pain is gradually improving, continue IVF 0.45% Saline @ KVO, continue weight based Dilaudid  PCA, IV  Toradol  15 mg Q 6 H for a total of 5 days, continue oral home pain medications as ordered. Monitor vitals very closely, Re-evaluate pain scale regularly, 2 L of Oxygen  by [place]. Patient encouraged to ambulate on the hallway today.  Leukocytosis: Stable Anemia of Chronic Disease: Hgb within patients baseline. Will continue to monitor daily CBC Chronic pain Syndrome: Continue oral home pain medication  Morbid obesity [BMI 50 or higher].  Provided education to patient on weight loss management, low calorie intake, diet and exercise i.e. lifestyle modification.   Code Status: Full Code Family Communication: N/A Disposition Plan: Patient will be ready for discharge in the morning. Homer CHRISTELLA Cover NP   If 7PM-7AM, please contact night-coverage.  11/04/2023, 1:27 PM  LOS: 1 day

## 2023-11-04 NOTE — Plan of Care (Addendum)
  Problem: Pain Managment: Goal: General experience of comfort will improve and/or be controlled Outcome: Progressing   Problem: Safety: Goal: Ability to remain free from injury will improve Outcome: Progressing   Problem: Skin Integrity: Goal: Risk for impaired skin integrity will decrease Outcome: Progressing   During shift patient refused evening dose of senna.

## 2023-11-04 NOTE — Plan of Care (Signed)
  Problem: Education: Goal: Knowledge of vaso-occlusive preventative measures will improve Outcome: Progressing Goal: Awareness of infection prevention will improve Outcome: Progressing Goal: Long-term complications will improve Outcome: Progressing   Problem: Self-Care: Goal: Ability to incorporate actions that prevent/reduce pain crisis will improve Outcome: Progressing   Problem: Respiratory: Goal: Pulmonary complications will be avoided or minimized Outcome: Progressing   Problem: Clinical Measurements: Goal: Will remain free from infection Outcome: Progressing Goal: Diagnostic test results will improve Outcome: Progressing   Problem: Nutrition: Goal: Adequate nutrition will be maintained Outcome: Progressing   Problem: Elimination: Goal: Will not experience complications related to urinary retention Outcome: Progressing   Problem: Skin Integrity: Goal: Risk for impaired skin integrity will decrease Outcome: Progressing

## 2023-11-05 ENCOUNTER — Encounter (HOSPITAL_COMMUNITY): Payer: Self-pay | Admitting: Internal Medicine

## 2023-11-05 NOTE — Progress Notes (Signed)
 AVS reviewed with patient. Still awaiting ride.

## 2023-11-05 NOTE — Plan of Care (Signed)

## 2023-11-05 NOTE — Discharge Summary (Signed)
 Physician Discharge Summary  Stephanie Burch FMW:[id number] DOB: Mar 12, 1980 DOA: 11/02/2023  PCP: Pcp, No  Admit date: 11/02/2023  Discharge date: 11/05/2023  Discharge Diagnoses:  Principal Problem:   Sickle cell anemia with pain (HCC) Active Problems:   Chronic pain syndrome   Anemia of chronic disease   Obesity, morbid, BMI 50 or higher (HCC)   Discharge Condition: Stable  Disposition:  Pt is discharged home in good condition and is to follow up with Pcp, No this week to have labs evaluated. Stephanie Burch is instructed to increase activity slowly and balance with rest for the next few days, and use prescribed medication to complete treatment of pain  Diet: Regular Wt Readings from Last 3 Encounters:  10/01/23 (!) 142.9 kg  09/27/23 (!) 142.9 kg  09/03/23 (!) 142.9 kg    History of present illness:   Stephanie Burch is a 43 y.o. female with history of sickle cell disease, chronic pain syndrome, opiate dependence and tolerance, obesity, and anemia of chronic disease presents to the day hospital with complaints of generalized pain, that is consistent with her typical pain crisis. Patient says that pain intensity has been elevated over the past few days and unrelieved by her home medications. Pain is worse in bilateral extremities and back. Patient last had oxycodone  this AM without sustained relief. Patient rates pain as 9/10, constant and throbbing. Patient denies fever, chills, chest pain, shortness of breath, nausea, vomiting, or diarrhea. No sick contacts or recent travel no urinary symptoms.    Day Hospital course: Patient presented to the day hospital with complaint of uncontrolled sickle cell pain. She was treated with IV fluid, IV pain medication with no resolution to pain symptoms. Patient is admitted for ongoing sickle cell pain management. Vitals and labs are within patient's baseline.  Hospital Course:  Patient was admitted for sickle cell pain crisis and managed  appropriately with IVF, IV Dilaudid  via PCA and IV Toradol , as well as other adjunct therapies per sickle cell pain management protocols.  Patient is reporting improved pain today at baseline.  She is able to ambulate without assistance, tolerating p.o. without nausea or vomiting.  Patient asked to be discharged home today.  She has no new concerns. Patient was therefore discharged home today in a hemodynamically stable condition.   Stephanie Burch will follow-up with PCP.  Stephanie Burch was counseled extensively about nonpharmacologic means of pain management, patient verbalized understanding and was appreciative of  the care received during this admission.   We discussed the need for good hydration, monitoring of hydration status, avoidance of heat, cold, stress, and infection triggers. We discussed the need to be adherent with taking her home medications. Patient was reminded of the need to seek medical attention immediately if any symptom of bleeding, anemia, or infection occurs.  Discharge Exam: Vitals:   11/05/23 0848 11/05/23 0908  BP: (!) 137/95   Pulse: 86   Resp: 15 17  Temp: 97.7 F (36.5 C)   SpO2: 97%    Vitals:   11/05/23 0209 11/05/23 0555 11/05/23 0848 11/05/23 0908  BP:  114/78 (!) 137/95   Pulse:  83 86   Resp: 14 12 15 17   Temp:  98.4 F (36.9 C) 97.7 F (36.5 C)   TempSrc:   Oral   SpO2:  100% 97%     General appearance : Awake, alert, not in any distress. Speech Clear. Not toxic looking HEENT: Atraumatic and Normocephalic, pupils equally reactive to light and accomodation Neck: Supple,  no JVD. No cervical lymphadenopathy.  Chest: Good air entry bilaterally, no added sounds  CVS: S1 S2 regular, no murmurs.  Abdomen: Bowel sounds present, Non tender and not distended with no gaurding, rigidity or rebound. Extremities: B/L Lower Ext shows no edema, both legs are warm to touch Neurology: Awake alert, and oriented X 3, CN II-XII intact, Non focal Skin: No Rash  Discharge  Instructions  Discharge Instructions     Call MD for:  severe uncontrolled pain   Complete by: As directed    Call MD for:  temperature >100.4   Complete by: As directed    Diet - low sodium heart healthy   Complete by: As directed    Increase activity slowly   Complete by: As directed       Allergies as of 11/05/2023   No Known Allergies      Medication List     TAKE these medications    acetaminophen  500 MG tablet Commonly known as: TYLENOL  Take 1,000 mg by mouth every 6 (six) hours as needed for mild pain (pain score 1-3) or headache.   ibuprofen  200 MG tablet Commonly known as: ADVIL  Take 800 mg by mouth every 6 (six) hours as needed for mild pain (pain score 1-3) or headache.   methadone  10 MG tablet Commonly known as: DOLOPHINE  Take 10-20 mg by mouth See admin instructions. Take 20 mg by mouth in the morning and 10 mg by moth  midday & at bedtime per patient   naloxone  4 MG/0.1ML Liqd nasal spray kit Commonly known as: NARCAN  Use as needed for opiod overdose   oxycodone  30 MG immediate release tablet Commonly known as: ROXICODONE  Take 30 mg by mouth 6 (six) times daily.   promethazine  25 MG tablet Commonly known as: PHENERGAN  Take 25 mg by mouth every 8 (eight) hours as needed for nausea or vomiting.   valACYclovir  1000 MG tablet Commonly known as: VALTREX  Take 1,000 mg by mouth every evening.        The results of significant diagnostics from this hospitalization (including imaging, microbiology, ancillary and laboratory) are listed below for reference.    Significant Diagnostic Studies: No results found.  Microbiology: No results found for this or any previous visit (from the past 240 hours).   Labs: Basic Metabolic Panel: Recent Labs  Lab 11/02/23 1015  NA 141  K 4.3  CL 106  CO2 25  GLUCOSE 102*  BUN <5*  CREATININE 0.52  CALCIUM 9.7   Liver Function Tests: Recent Labs  Lab 11/02/23 1015  AST 19  ALT 11  ALKPHOS 82   BILITOT 0.5  PROT 7.5  ALBUMIN 4.0   No results for input(s): LIPASE, AMYLASE in the last 168 hours. No results for input(s): AMMONIA in the last 168 hours. CBC: Recent Labs  Lab 11/02/23 1015 11/03/23 0900 11/04/23 0513  WBC 7.9 9.5 8.5  NEUTROABS 5.3  --   --   HGB 11.1* 11.3* 10.2*  HCT 34.0* 34.1* 31.8*  MCV 71.6* 72.1* 71.6*  PLT 265 274 225   Cardiac Enzymes: No results for input(s): CKTOTAL, CKMB, CKMBINDEX, TROPONINI in the last 168 hours. BNP: Invalid input(s): POCBNP CBG: No results for input(s): GLUCAP in the last 168 hours.  Time coordinating discharge: 50 minutes  Signed:  Homer Burch Cover NP  [facility] 11/05/2023, 10:31 AM

## 2023-11-05 NOTE — Progress Notes (Signed)
 Patient refused labs this morning. MD aware. No new orders.

## 2023-11-05 NOTE — Progress Notes (Signed)
 I attest to student documentation.  Cherye Gaertner, MSN-RN Nursing Faculty/Clinical Instructor [facility]

## 2023-11-26 ENCOUNTER — Non-Acute Institutional Stay (HOSPITAL_COMMUNITY)
Admission: AD | Admit: 2023-11-26 | Discharge: 2023-11-26 | Disposition: A | Discharge status: Home / Self Care | Source: Ambulatory Visit | Attending: Internal Medicine | Admitting: Internal Medicine

## 2023-11-26 ENCOUNTER — Telehealth (HOSPITAL_COMMUNITY): Payer: Self-pay | Admitting: *Deleted

## 2023-11-26 DIAGNOSIS — Z79891 Long term (current) use of opiate analgesic: Secondary | ICD-10-CM | POA: Insufficient documentation

## 2023-11-26 DIAGNOSIS — M79605 Pain in left leg: Secondary | ICD-10-CM | POA: Insufficient documentation

## 2023-11-26 DIAGNOSIS — E669 Obesity, unspecified: Secondary | ICD-10-CM | POA: Insufficient documentation

## 2023-11-26 DIAGNOSIS — M79604 Pain in right leg: Secondary | ICD-10-CM | POA: Insufficient documentation

## 2023-11-26 DIAGNOSIS — D5700 Hb-SS disease with crisis, unspecified: Secondary | ICD-10-CM | POA: Insufficient documentation

## 2023-11-26 DIAGNOSIS — D638 Anemia in other chronic diseases classified elsewhere: Secondary | ICD-10-CM | POA: Insufficient documentation

## 2023-11-26 DIAGNOSIS — M549 Dorsalgia, unspecified: Secondary | ICD-10-CM | POA: Insufficient documentation

## 2023-11-26 DIAGNOSIS — G894 Chronic pain syndrome: Secondary | ICD-10-CM | POA: Insufficient documentation

## 2023-11-26 DIAGNOSIS — R52 Pain, unspecified: Secondary | ICD-10-CM | POA: Diagnosis not present

## 2023-11-26 LAB — COMPREHENSIVE METABOLIC PANEL WITH GFR
ALT: 9 U/L (ref 0–44)
AST: 18 U/L (ref 15–41)
Albumin: 4.1 g/dL (ref 3.5–5.0)
Alkaline Phosphatase: 91 U/L (ref 38–126)
Anion gap: 11 (ref 5–15)
BUN: 7 mg/dL (ref 6–20)
CO2: 24 mmol/L (ref 22–32)
Calcium: 9.4 mg/dL (ref 8.9–10.3)
Chloride: 103 mmol/L (ref 98–111)
Creatinine, Ser: 0.66 mg/dL (ref 0.44–1.00)
GFR, Estimated: >60 mL/min (ref >60)
Glucose, Bld: 103 mg/dL — ABNORMAL HIGH (ref 70–99)
Potassium: 4.1 mmol/L (ref 3.5–5.1)
Sodium: 138 mmol/L (ref 135–145)
Total Bilirubin: 0.7 mg/dL (ref 0.0–1.2)
Total Protein: 8.3 g/dL — ABNORMAL HIGH (ref 6.5–8.1)

## 2023-11-26 LAB — CBC WITH DIFFERENTIAL/PLATELET
Abs Immature Granulocytes: 0.01 K/uL (ref 0.00–0.07)
Basophils Absolute: 0 K/uL (ref 0.0–0.1)
Basophils Relative: 0 %
Eosinophils Absolute: 0.2 K/uL (ref 0.0–0.5)
Eosinophils Relative: 2 %
HCT: 37.5 % (ref 36.0–46.0)
Hemoglobin: 12.4 g/dL (ref 12.0–15.0)
Immature Granulocytes: 0 %
Lymphocytes Relative: 25 %
Lymphs Abs: 1.8 K/uL (ref 0.7–4.0)
MCH: 23.2 pg — ABNORMAL LOW (ref 26.0–34.0)
MCHC: 33.1 g/dL (ref 30.0–36.0)
MCV: 70.1 fL — ABNORMAL LOW (ref 80.0–100.0)
Monocytes Absolute: 0.6 K/uL (ref 0.1–1.0)
Monocytes Relative: 9 %
Neutro Abs: 4.7 K/uL (ref 1.7–7.7)
Neutrophils Relative %: 64 %
Platelets: 265 K/uL (ref 150–400)
RBC: 5.35 MIL/uL — ABNORMAL HIGH (ref 3.87–5.11)
RDW: 15.5 % (ref 11.5–15.5)
WBC: 7.3 K/uL (ref 4.0–10.5)
nRBC: 0.3 % — ABNORMAL HIGH (ref 0.0–0.2)

## 2023-11-26 LAB — RETICULOCYTES
Immature Retic Fract: 22 % — ABNORMAL HIGH (ref 2.3–15.9)
RBC.: 5.44 MIL/uL — ABNORMAL HIGH (ref 3.87–5.11)
Retic Count, Absolute: 151.8 K/uL (ref 19.0–186.0)
Retic Ct Pct: 2.8 % (ref 0.4–3.1)

## 2023-11-26 LAB — LACTATE DEHYDROGENASE: LDH: 269 U/L — ABNORMAL HIGH (ref 98–192)

## 2023-11-26 MED ORDER — NALOXONE HCL 0.4 MG/ML IJ SOLN: 0.4000 mg | INTRAMUSCULAR | Status: DC | PRN

## 2023-11-26 MED ORDER — POLYETHYLENE GLYCOL 3350 17 G PO PACK: 17.0000 g | PACK | Freq: Every day | ORAL | Status: DC | PRN

## 2023-11-26 MED ORDER — DIPHENHYDRAMINE HCL 25 MG PO CAPS: 25.0000 mg | ORAL_CAPSULE | ORAL | Status: DC | PRN

## 2023-11-26 MED ORDER — ONDANSETRON HCL 4 MG/2ML IJ SOLN
4.0000 mg | Freq: Four times a day (QID) | INTRAMUSCULAR | Status: DC | PRN
  Administered 2023-11-26: 4 mg via INTRAVENOUS
  Filled 2023-11-26: qty 2

## 2023-11-26 MED ORDER — HYDROMORPHONE 1 MG/ML IV SOLN
INTRAVENOUS | Status: DC
  Administered 2023-11-26: 12.5 mg via INTRAVENOUS
  Administered 2023-11-26: 30 mg via INTRAVENOUS
  Filled 2023-11-26: qty 30

## 2023-11-26 MED ORDER — SODIUM CHLORIDE 0.9% FLUSH: 9.0000 mL | INTRAVENOUS | Status: DC | PRN

## 2023-11-26 MED ORDER — SENNOSIDES-DOCUSATE SODIUM 8.6-50 MG PO TABS: 1.0000 | ORAL_TABLET | Freq: Two times a day (BID) | ORAL | Status: DC

## 2023-11-26 NOTE — H&P (Signed)
 [facility] History and Physical  Stephanie Burch FMW:[id number] DOB: 1980-05-06 DOA: 11/26/2023  PCP: Pcp, No   Chief Complaint:  Chief Complaint  Patient presents with   Sickle Cell Pain Crisis    HPI: Stephanie Burch is a 43 y.o. female with history of sickle cell disease, chronic pain syndrome, opiate dependence and tolerance, obesity, and anemia of chronic disease presents to the day hospital with complaints of generalized pain, that is consistent with her typical pain crisis. Patient says that pain intensity has been elevated over the past few days and unrelieved by her home medications. Pain is worse in bilateral extremities and back. Patient last had oxycodone  30 mg and methadone  10 mg at 6 AM without sustained relief. Patient rates pain as 8/10, constant and throbbing. Patient denies fever, chills, chest pain, shortness of breath, nausea, vomiting, or diarrhea. No sick contacts or recent travel no urinary symptoms.     Systemic Review: General: The patient denies anorexia, fever, weight loss Cardiac: Denies chest pain, syncope, palpitations, pedal edema  Respiratory: Denies cough, shortness of breath, wheezing GI: Denies severe indigestion/heartburn, abdominal pain, nausea, vomiting, diarrhea and constipation GU: Denies hematuria, incontinence, dysuria  Musculoskeletal: Generalize body tenderness.  Skin: Denies suspicious skin lesions Neurologic: Denies focal weakness or numbness, change in vision  Past Medical History:  Diagnosis Date   Acute kidney injury 11/07/2019   Anemia    Chronic pain    COVID-19 virus detected 10/22/2019   Depression    HCAP (healthcare-associated pneumonia) 11/07/2019   History of blood transfusion 2013   x 2   HSV infection    Hypoxia    Pneumonia    x 1   Pulmonary edema 11/11/2019   Sepsis (HCC) 11/07/2019   Sickle cell anemia (HCC) 09/2021   gets them frequently   Tachycardia with heart rate 100-120 beats per minute     Vitamin B12 deficiency 12/2018   Vitamin D  deficiency     Past Surgical History:  Procedure Laterality Date   CESAREAN SECTION  2013   x 1   TOOTH EXTRACTION N/A 10/18/2021   Procedure: DENTAL RESTORATION/EXTRACTIONS;  Surgeon: Sheryle Hamilton, DMD;  Location: [facility] OR;  Service: Oral Surgery;  Laterality: N/A;    No Known Allergies  Family History  Problem Relation Age of Onset   Hypertension Mother    Sickle cell trait Mother    Diabetes Father    Sickle cell trait Father       Prior to Admission medications   Medication Sig Start Date End Date Taking? Authorizing Provider  acetaminophen  (TYLENOL ) 500 MG tablet Take 1,000 mg by mouth every 6 (six) hours as needed for mild pain (pain score 1-3) or headache.    [provider]  ibuprofen  (ADVIL ) 200 MG tablet Take 800 mg by mouth every 6 (six) hours as needed for mild pain (pain score 1-3) or headache.    [provider]  methadone  (DOLOPHINE ) 10 MG tablet Take 10-20 mg by mouth See admin instructions. Take 20 mg by mouth in the morning and 10 mg midday & at bedtime 03/26/23   [provider]  naloxone  (NARCAN ) nasal spray 4 mg/0.1 mL Use as needed for opiod overdose 03/04/22   Paseda, Folashade R, FNP  oxycodone  (ROXICODONE ) 30 MG immediate release tablet Take 30 mg by mouth 6 (six) times daily.    [provider]  promethazine  (PHENERGAN ) 25 MG tablet Take 25 mg by mouth every 8 (eight) hours as  needed for nausea or vomiting.    [provider]  valACYclovir  (VALTREX ) 1000 MG tablet Take 1,000 mg by mouth daily. 01/15/23   [provider]     Physical Exam: Vitals:   11/26/23 1019 11/26/23 1131 11/26/23 1234  BP: 113/77  124/89  Pulse: 100  89  Resp: 20 20 16   Temp: (!) 97.2 F (36.2 C)    TempSrc: Temporal    SpO2:   95%     General: Alert, awake, afebrile, anicteric, not in obvious distress HEENT: Normocephalic and Atraumatic, Mucous membranes pink                 PERRLA; EOM intact; No scleral icterus,                 Nares: Patent, Oropharynx: Clear, Fair Dentition                 Neck: FROM, no cervical lymphadenopathy, thyromegaly, carotid bruit or JVD;  CHEST WALL: No tenderness  CHEST: Normal respiration, clear to auscultation bilaterally  HEART: Regular rate and rhythm; no murmurs rubs or gallops  BACK: No kyphosis or scoliosis; no CVA tenderness  ABDOMEN: Positive Bowel Sounds, soft, non-tender; no masses, no organomegaly EXTREMITIES: No cyanosis, clubbing, or edema SKIN:  no rash or ulceration  CNS: Alert and Oriented x 4, Nonfocal exam, CN 2-12 intact  Labs on Admission:  Basic Metabolic Panel: No results for input(s): NA, K, CL, CO2, GLUCOSE, BUN, CREATININE, CALCIUM, MG, PHOS in the last 168 hours.  Liver Function Tests: No results for input(s): AST, ALT, ALKPHOS, BILITOT, PROT, ALBUMIN in the last 168 hours.  No results for input(s): LIPASE, AMYLASE in the last 168 hours. No results for input(s): AMMONIA in the last 168 hours. CBC: Recent Labs  Lab 11/26/23 1130  WBC 7.3  NEUTROABS 4.7  HGB 12.4  HCT 37.5  MCV 70.1*  PLT 265    Cardiac Enzymes: No results for input(s): CKTOTAL, CKMB, CKMBINDEX, TROPONINI in the last 168 hours.  BNP (last 3 results) No results for input(s): BNP in the last 8760 hours.  ProBNP (last 3 results) No results for input(s): PROBNP in the last 8760 hours.  CBG: No results for input(s): GLUCAP in the last 168 hours.   Assessment/Plan Principal Problem:   Sickle cell anemia with pain (HCC)  Admits to the Day Hospital for extended observation IVF 0.45% Saline @125  mls/hour Weight based Dilaudid  PCA started within 30 minutes of admission Acetaminophen  1000 mg x 1 dose Labs: CBCD, CMP, Retic Count and LDH Monitor vitals very closely, Re-evaluate pain scale every hour 2 L of Oxygen  by [place] Patient will be re-evaluated for pain in the  context of function and relationship to baseline as care progresses. If no significant relieve from pain (remains above 5/10) will transfer patient to inpatient services for further evaluation and management  Code Status: Full  Family Communication: None  DVT Prophylaxis: Ambulate as tolerated   Time spent: 35 Minutes  Homer CHRISTELLA Cover NP   If 7PM-7AM, please contact night-coverage [email or web address] 11/26/2023, 1:33 PM

## 2023-11-26 NOTE — Discharge Summary (Signed)
 Physician Discharge Summary  Stephanie Burch FMW:[id number] DOB: [birth year] DOA: 11/26/2023  PCP: Pcp, No  Admit date: 11/26/2023  Discharge date: 11/26/2023  Time spent: 30 minutes  Discharge Diagnoses:  Principal Problem:   Sickle cell anemia with pain [facility])   Discharge Condition: Stable  Diet recommendation: Regular  History of present illness:  Stephanie Burch is a 43 y.o. female with history of sickle cell disease, chronic pain syndrome, opiate dependence and tolerance, obesity, and anemia of chronic disease presents to the day hospital with complaints of generalized pain, that is consistent with her typical pain crisis. Patient says that pain intensity has been elevated over the past few days and unrelieved by her home medications. Pain is worse in bilateral extremities and back. Patient last had oxycodone  30 mg and methadone  10 mg at 6 AM without sustained relief. Patient rates pain as 8/10, constant and throbbing. Patient denies fever, chills, chest pain, shortness of breath, nausea, vomiting, or diarrhea. No sick contacts or recent travel no urinary symptoms.     Hospital Course:  Stephanie Burch was admitted to the day hospital with sickle cell painful crisis. Patient was treated with IV fluid, weight based IV Dilaudid  PCA, clinician assisted doses as deemed appropriate, and other adjunct therapies per sickle cell pain management protocol. Stephanie Burch showed improvement symptomatically, pain improved from 8/10 to 5/10 at the time of discharge. Patient was discharged home in a hemodynamically stable condition. Stephanie Burch will follow-up at the clinic as previously scheduled, continue with home medications as per prior to admission.  Discharge Instructions We discussed the need for good hydration, monitoring of hydration status, avoidance of heat, cold, stress, and infection triggers. We discussed the need to be compliant with taking Hydrea  and other home medications. Stephanie Burch was  reminded of the need to seek medical attention immediately if any symptom of bleeding, anemia, or infection occurs.  Discharge Exam: Vitals:   11/26/23 1131 11/26/23 1234  BP:  124/89  Pulse:  89  Resp: 20 16  Temp:    SpO2:  95%    General appearance: alert, cooperative and no distress Eyes: conjunctivae/corneas clear. PERRL, EOM's intact. Fundi benign. Neck: no adenopathy, no carotid bruit, no JVD, supple, symmetrical, trachea midline and thyroid  not enlarged, symmetric, no tenderness/mass/nodules Back: symmetric, no curvature. ROM normal. No CVA tenderness. Resp: clear to auscultation bilaterally Chest wall: no tenderness Cardio: regular rate and rhythm, S1, S2 normal, no murmur, click, rub or gallop GI: soft, non-tender; bowel sounds normal; no masses,  no organomegaly Extremities: extremities normal, atraumatic, no cyanosis or edema Pulses: 2+ and symmetric Skin: Skin color, texture, turgor normal. No rashes or lesions Neurologic: Grossly normal  Discharge Instructions     Call MD for:  severe uncontrolled pain   Complete by: As directed    Call MD for:  temperature >100.4   Complete by: As directed    Diet - low sodium heart healthy   Complete by: As directed    Increase activity slowly   Complete by: As directed       Allergies as of 11/26/2023   No Known Allergies      Medication List     TAKE these medications    acetaminophen  500 MG tablet Commonly known as: TYLENOL  Take 1,000 mg by mouth every 6 (six) hours as needed for mild pain (pain score 1-3) or headache.   ibuprofen  200 MG tablet Commonly known as: ADVIL  Take 800 mg by mouth every 6 (six) hours as needed  for mild pain (pain score 1-3) or headache.   methadone  10 MG tablet Commonly known as: DOLOPHINE  Take 10-20 mg by mouth See admin instructions. Take 20 mg by mouth in the morning and 10 mg by moth  midday & at bedtime per patient   naloxone  4 MG/0.1ML Liqd nasal spray kit Commonly known  as: NARCAN  Use as needed for opiod overdose   oxycodone  30 MG immediate release tablet Commonly known as: ROXICODONE  Take 30 mg by mouth 6 (six) times daily.   promethazine  25 MG tablet Commonly known as: PHENERGAN  Take 25 mg by mouth every 8 (eight) hours as needed for nausea or vomiting.   valACYclovir  1000 MG tablet Commonly known as: VALTREX  Take 1,000 mg by mouth every evening.       No Known Allergies   Significant Diagnostic Studies: No results found.  Signed:  Homer CHRISTELLA Cover NP   11/26/2023, 1:52 PM

## 2023-11-26 NOTE — Progress Notes (Addendum)
 Diagnosis: sickle cell center    Provider: Homer Cover, NP    Procedure: Dilaudid  PCA     Note:  Patient came to day hospital for treatment , placed on PCA pump 0/5/10/3. Hydrated with NS 0.45% at 50 cc/hr Pt tolerated well, pain decreased from 10/10 8/10 in bilateral legs.  Patient also given Zofran  for nausea.  Pt is alert and oriented and ambulatory upon discharge.taken home via [place]. Pt verbalized understanding of hydration and management of pain at home and to seek emergency help if she ever feels needs.

## 2023-11-26 NOTE — Telephone Encounter (Signed)
 Patient called in. Complains of pain in Legs rates 8/10. Denied chest pain, abd pain, fever, N/V/D. Wants to come in for treatment. Last took Oxycodone  30 mg and methodone 10 mg at 0600am.  Per [name] NP she may come to day hospital for treatment. Called patient and made aware that we would send taxi to pick her up per request to her home to bring to facility for treatment. Patient endorses understanding.

## 2023-11-28 ENCOUNTER — Other Ambulatory Visit: Payer: Self-pay

## 2023-11-28 ENCOUNTER — Encounter (HOSPITAL_COMMUNITY): Payer: Self-pay | Admitting: *Deleted

## 2023-11-28 ENCOUNTER — Inpatient Hospital Stay (HOSPITAL_COMMUNITY)
Admission: EM | Admit: 2023-11-28 | Discharge: 2023-12-03 | DRG: 812 | Disposition: A | Discharge status: Home / Self Care | Attending: Internal Medicine | Admitting: Internal Medicine

## 2023-11-28 DIAGNOSIS — I272 Pulmonary hypertension, unspecified: Secondary | ICD-10-CM | POA: Diagnosis present

## 2023-11-28 DIAGNOSIS — Z832 Family history of diseases of the blood and blood-forming organs and certain disorders involving the immune mechanism: Secondary | ICD-10-CM

## 2023-11-28 DIAGNOSIS — Z6841 Body Mass Index (BMI) 40.0 and over, adult: Secondary | ICD-10-CM | POA: Diagnosis not present

## 2023-11-28 DIAGNOSIS — Z833 Family history of diabetes mellitus: Secondary | ICD-10-CM

## 2023-11-28 DIAGNOSIS — Z8249 Family history of ischemic heart disease and other diseases of the circulatory system: Secondary | ICD-10-CM

## 2023-11-28 DIAGNOSIS — G894 Chronic pain syndrome: Secondary | ICD-10-CM | POA: Diagnosis present

## 2023-11-28 DIAGNOSIS — D5700 Hb-SS disease with crisis, unspecified: Principal | ICD-10-CM | POA: Diagnosis present

## 2023-11-28 DIAGNOSIS — M79604 Pain in right leg: Secondary | ICD-10-CM

## 2023-11-28 DIAGNOSIS — D72829 Elevated white blood cell count, unspecified: Secondary | ICD-10-CM | POA: Diagnosis present

## 2023-11-28 DIAGNOSIS — D638 Anemia in other chronic diseases classified elsewhere: Secondary | ICD-10-CM | POA: Diagnosis present

## 2023-11-28 DIAGNOSIS — R52 Pain, unspecified: Secondary | ICD-10-CM | POA: Diagnosis present

## 2023-11-28 LAB — COMPREHENSIVE METABOLIC PANEL WITH GFR
ALT: 13 U/L (ref 0–44)
AST: 30 U/L (ref 15–41)
Albumin: 4.1 g/dL (ref 3.5–5.0)
Alkaline Phosphatase: 83 U/L (ref 38–126)
Anion gap: 10 (ref 5–15)
BUN: <5 mg/dL — ABNORMAL LOW (ref 6–20)
CO2: 25 mmol/L (ref 22–32)
Calcium: 9.8 mg/dL (ref 8.9–10.3)
Chloride: 105 mmol/L (ref 98–111)
Creatinine, Ser: 0.68 mg/dL (ref 0.44–1.00)
GFR, Estimated: >60 mL/min (ref >60)
Glucose, Bld: 86 mg/dL (ref 70–99)
Potassium: 4.2 mmol/L (ref 3.5–5.1)
Sodium: 141 mmol/L (ref 135–145)
Total Bilirubin: 0.8 mg/dL (ref 0.0–1.2)
Total Protein: 8.1 g/dL (ref 6.5–8.1)

## 2023-11-28 LAB — CBC WITH DIFFERENTIAL/PLATELET
Abs Immature Granulocytes: 0.01 K/uL (ref 0.00–0.07)
Basophils Absolute: 0 K/uL (ref 0.0–0.1)
Basophils Relative: 0 %
Eosinophils Absolute: 0.2 K/uL (ref 0.0–0.5)
Eosinophils Relative: 3 %
HCT: 35.5 % — ABNORMAL LOW (ref 36.0–46.0)
Hemoglobin: 11.4 g/dL — ABNORMAL LOW (ref 12.0–15.0)
Immature Granulocytes: 0 %
Lymphocytes Relative: 24 %
Lymphs Abs: 1.5 K/uL (ref 0.7–4.0)
MCH: 22.6 pg — ABNORMAL LOW (ref 26.0–34.0)
MCHC: 32.1 g/dL (ref 30.0–36.0)
MCV: 70.3 fL — ABNORMAL LOW (ref 80.0–100.0)
Monocytes Absolute: 0.5 K/uL (ref 0.1–1.0)
Monocytes Relative: 8 %
Neutro Abs: 4.2 K/uL (ref 1.7–7.7)
Neutrophils Relative %: 65 %
Platelets: 231 K/uL (ref 150–400)
RBC: 5.05 MIL/uL (ref 3.87–5.11)
RDW: 15.2 % (ref 11.5–15.5)
WBC: 6.4 K/uL (ref 4.0–10.5)
nRBC: 0 % (ref 0.0–0.2)

## 2023-11-28 LAB — RETICULOCYTES
Immature Retic Fract: 25.3 % — ABNORMAL HIGH (ref 2.3–15.9)
RBC.: 5.04 MIL/uL (ref 3.87–5.11)
Retic Count, Absolute: 133.1 K/uL (ref 19.0–186.0)
Retic Ct Pct: 2.6 % (ref 0.4–3.1)

## 2023-11-28 MED ORDER — METHADONE HCL 10 MG PO TABS
20.0000 mg | ORAL_TABLET | Freq: Every day | ORAL | Status: DC
Start: 2023-11-29 — End: 2023-12-03
  Administered 2023-11-29 – 2023-12-03 (×5): 20 mg via ORAL
  Filled 2023-11-28 (×5): qty 2

## 2023-11-28 MED ORDER — OXYCODONE-ACETAMINOPHEN 5-325 MG PO TABS
2.0000 | ORAL_TABLET | Freq: Once | ORAL | Status: DC
Start: 2023-11-28 — End: 2023-11-28

## 2023-11-28 MED ORDER — DEXTROSE-SODIUM CHLORIDE 5-0.45 % IV SOLN: INTRAVENOUS | Status: AC

## 2023-11-28 MED ORDER — HYDROMORPHONE HCL 1 MG/ML IJ SOLN: 2.0000 mg | INTRAMUSCULAR | Status: AC

## 2023-11-28 MED ORDER — SENNOSIDES-DOCUSATE SODIUM 8.6-50 MG PO TABS
1.0000 | ORAL_TABLET | Freq: Two times a day (BID) | ORAL | Status: DC
  Administered 2023-12-03: 1 via ORAL
  Filled 2023-11-28 (×9): qty 1

## 2023-11-28 MED ORDER — DIPHENHYDRAMINE HCL 25 MG PO CAPS: 25.0000 mg | ORAL_CAPSULE | Freq: Once | ORAL | Status: DC

## 2023-11-28 MED ORDER — KETOROLAC TROMETHAMINE 15 MG/ML IJ SOLN
15.0000 mg | Freq: Four times a day (QID) | INTRAMUSCULAR | Status: DC
  Administered 2023-11-29 – 2023-12-03 (×18): 15 mg via INTRAVENOUS
  Filled 2023-11-28 (×18): qty 1

## 2023-11-28 MED ORDER — HYDROMORPHONE HCL 1 MG/ML IJ SOLN
2.0000 mg | INTRAMUSCULAR | Status: AC
  Administered 2023-11-28: 2 mg via INTRAVENOUS
  Filled 2023-11-28: qty 2

## 2023-11-28 MED ORDER — DIPHENHYDRAMINE HCL 50 MG/ML IJ SOLN
12.5000 mg | Freq: Once | INTRAMUSCULAR | Status: AC
  Administered 2023-11-28: 12.5 mg via INTRAVENOUS
  Filled 2023-11-28: qty 1

## 2023-11-28 MED ORDER — ENOXAPARIN SODIUM 40 MG/0.4ML IJ SOSY
40.0000 mg | PREFILLED_SYRINGE | INTRAMUSCULAR | Status: DC
  Administered 2023-11-30 – 2023-12-02 (×3): 40 mg via SUBCUTANEOUS
  Filled 2023-11-28 (×6): qty 0.4

## 2023-11-28 MED ORDER — HYDROMORPHONE HCL 1 MG/ML IJ SOLN
2.0000 mg | INTRAMUSCULAR | Status: AC | PRN
  Administered 2023-11-28 – 2023-11-29 (×3): 2 mg via INTRAVENOUS
  Filled 2023-11-28 (×3): qty 2

## 2023-11-28 MED ORDER — HYDROMORPHONE HCL 1 MG/ML IJ SOLN
2.0000 mg | INTRAMUSCULAR | Status: AC
  Filled 2023-11-28: qty 2

## 2023-11-28 MED ORDER — HYDROMORPHONE 1 MG/ML IV SOLN
INTRAVENOUS | Status: DC
  Administered 2023-11-29: 9 mg via INTRAVENOUS
  Administered 2023-11-29: 10.5 mg via INTRAVENOUS
  Administered 2023-11-29: 30 mg via INTRAVENOUS
  Administered 2023-11-29: 10.79 mg via INTRAVENOUS
  Administered 2023-11-29: 9.5 mg via INTRAVENOUS
  Administered 2023-11-29 (×2): 30 mg via INTRAVENOUS
  Administered 2023-11-30: 5 mg via INTRAVENOUS
  Administered 2023-11-30: 8.5 mg via INTRAVENOUS
  Administered 2023-11-30: 30 mg via INTRAVENOUS
  Administered 2023-11-30: 17 mg via INTRAVENOUS
  Administered 2023-11-30: 17.5 mg via INTRAVENOUS
  Administered 2023-11-30 – 2023-12-01 (×2): 30 mg via INTRAVENOUS
  Administered 2023-12-01: 6.5 mg via INTRAVENOUS
  Administered 2023-12-01: 7 mg via INTRAVENOUS
  Administered 2023-12-01: 19 mg via INTRAVENOUS
  Administered 2023-12-01: 30 mg via INTRAVENOUS
  Administered 2023-12-01: 7 mg via INTRAVENOUS
  Administered 2023-12-01: 9.5 mg via INTRAVENOUS
  Administered 2023-12-01: 10.5 mg via INTRAVENOUS
  Administered 2023-12-02: 8.5 mg via INTRAVENOUS
  Administered 2023-12-02: 12 mg via INTRAVENOUS
  Administered 2023-12-02: 2 mg via INTRAVENOUS
  Administered 2023-12-02: 6.5 mg via INTRAVENOUS
  Administered 2023-12-02: 30 mg via INTRAVENOUS
  Administered 2023-12-02: 10.5 mg via INTRAVENOUS
  Administered 2023-12-02: 8 mg via INTRAVENOUS
  Administered 2023-12-03: 9.5 mg via INTRAVENOUS
  Administered 2023-12-03: 30 mg via INTRAVENOUS
  Filled 2023-11-28 (×9): qty 30

## 2023-11-28 MED ORDER — DIPHENHYDRAMINE HCL 25 MG PO CAPS
25.0000 mg | ORAL_CAPSULE | ORAL | Status: DC | PRN
  Administered 2023-11-29: 25 mg via ORAL
  Filled 2023-11-28 (×2): qty 1

## 2023-11-28 MED ORDER — DIPHENHYDRAMINE HCL 50 MG/ML IJ SOLN
12.5000 mg | Freq: Once | INTRAMUSCULAR | Status: DC
Start: 2023-11-28 — End: 2023-11-28

## 2023-11-28 MED ORDER — HYDROMORPHONE HCL 1 MG/ML IJ SOLN
2.0000 mg | INTRAMUSCULAR | Status: AC
  Administered 2023-11-28 (×3): 2 mg via INTRAVENOUS
  Filled 2023-11-28 (×2): qty 2

## 2023-11-28 MED ORDER — NALOXONE HCL 0.4 MG/ML IJ SOLN: 0.4000 mg | INTRAMUSCULAR | Status: DC | PRN

## 2023-11-28 MED ORDER — ONDANSETRON HCL 4 MG/2ML IJ SOLN
4.0000 mg | Freq: Four times a day (QID) | INTRAMUSCULAR | Status: DC | PRN
  Administered 2023-11-29 – 2023-12-02 (×2): 4 mg via INTRAVENOUS
  Filled 2023-11-28 (×2): qty 2

## 2023-11-28 MED ORDER — POLYETHYLENE GLYCOL 3350 17 G PO PACK: 17.0000 g | PACK | Freq: Every day | ORAL | Status: DC | PRN

## 2023-11-28 MED ORDER — SODIUM CHLORIDE 0.9% FLUSH: 9.0000 mL | INTRAVENOUS | Status: DC | PRN

## 2023-11-28 MED ORDER — METHADONE HCL 10 MG PO TABS
10.0000 mg | ORAL_TABLET | Freq: Two times a day (BID) | ORAL | Status: DC
  Administered 2023-11-28 – 2023-12-02 (×9): 10 mg via ORAL
  Filled 2023-11-28 (×11): qty 1

## 2023-11-28 MED ORDER — HYDROMORPHONE HCL 1 MG/ML IJ SOLN
INTRAMUSCULAR | Status: AC
  Filled 2023-11-28: qty 2

## 2023-11-28 NOTE — ED Provider Notes (Cosign Needed Addendum)
 Physical Exam  BP 125/84 (BP Location: Right Wrist)   Pulse 91   Temp 98.4 F (36.9 C) (Oral)   Resp (!) 21   Ht 5' 5 (1.651 m)   Wt (!) 142.9 kg   LMP 11/25/2023   SpO2 97%   BMI 52.42 kg/m   Physical Exam Vitals and nursing note reviewed.  Constitutional:      General: She is not in acute distress.    Appearance: Normal appearance. She is not ill-appearing.  HENT:     Head: Normocephalic and atraumatic.  Eyes:     General:        Right eye: No discharge.        Left eye: No discharge.     Extraocular Movements: Extraocular movements intact.     Conjunctiva/sclera: Conjunctivae normal.  Cardiovascular:     Rate and Rhythm: Normal rate and regular rhythm.  Pulmonary:     Effort: Pulmonary effort is normal. No respiratory distress.  Musculoskeletal:        General: Tenderness present.     Cervical back: Normal range of motion and neck supple. No rigidity or tenderness.     Right lower leg: No edema.     Left lower leg: No edema.  Skin:    General: Skin is warm and dry.     Findings: No bruising.     Comments: DP pulse 2+ bilaterally.  Neurological:     Mental Status: She is alert.  Psychiatric:     Comments: Tearful on exam     Procedures  Procedures  ED Course / MDM   Clinical Course as of 11/28/23 1917  Sat Nov 28, 2023  1518 Trouble with getting IV. Bilateral lower extremity pain, normal for patient for pain crisis. No concerns for avascular necrosis. No chest pain or shortness of breath.  [CB]  1915 Spoke to Dr. Jegede with sickle cell team who said that he would put in the admission orders.  Noted that he was coming to the hospital to evaluate her. [CB]    Clinical Course User Index [CB] Beola Terrall RAMAN, PA-C   Medical Decision Making Amount and/or Complexity of Data Reviewed Labs: ordered.  Risk Prescription drug management. Decision regarding hospitalization.   See previous provider note.  Patient care transferred over from [facility], PA-C.  At time of handoff, awaiting IV, needing medications, likely admission with previous admissions in the past 3 visits to the ER.  Pain similar to previous episodes of sickle cell pain crisis.  Noted to be over bilateral lower anterior extremities, with low concerns for avascular necrosis at this time with no new or worse symptoms when compared to previous pain crises.  Noted to have taken 6 doses today of 30 mg oxycodone  as well as methadone  without relief.  Pain starting 2 days ago after being discharged by sickle cell team on Tylenol , ibuprofen , methadone , naloxone , oxycodone , promethazine , valacyclovir .  Ultrasound IV was done by Dr. Mannie.  IV medications were then started.  On reevaluation after 3 doses of 2 mg of Dilaudid , having still persistent pain however has had mild improvement from 10 out of 10 to 8 out of 10 pain.  However still tearful.  Will seek to admit for pain control.  Spoke with Dr. Jegede with sickle cell team.  Noted that he will put in the admission orders, patient care transferred over him at this time.       Beola Terrall RAMAN, [place] 11/28/23 8083  Beola Terrall RAMAN, [place] 11/28/23 1917    Mannie Pac T, DO 11/29/23 1530

## 2023-11-28 NOTE — ED Notes (Signed)
 MD notified of continued diffculty getting IV access by multiple RNs including ultrasound IV attempts. Patient refuses to get IV in the [facility] because she wants to be able to bend her arm.

## 2023-11-28 NOTE — Progress Notes (Signed)
 Called for peripheral IV placement. U/S used to assess both arms. Attempted x3 using U/S, GBR obtained,6 ml blood for labs obtained, but unable to thread IV catheters. Veins found in bilateral upper arms too deep for Midline placement. Patient's RN will notify MD unable to obtain IV access

## 2023-11-28 NOTE — ED Notes (Signed)
 ED provider notified that patient's pain remains 10/10 after failed IV attempts. She is agreeable to PO medications.

## 2023-11-28 NOTE — ED Provider Notes (Signed)
 [place] EMERGENCY DEPARTMENT AT [facility] Provider Note   CSN: [id number] Arrival date & time: 11/28/23  9251     Patient presents with: Sickle Cell Pain Crisis   Stephanie Burch is a 43 y.o. female patient with history of sickle cell disease, chronic pain syndrome, opioid dependence and tolerance, obesity who presents to the emergency department today for further evaluation of acute on chronic sickle cell pain crisis.  Pain is localized to the bilateral lower extremities which is typical for her.  She states that nothing is new about her pain.  She denies chest pain or shortness of breath.  Patient was just discharged from sickle cell pain clinic on 11/26/2023.  She attempted to call them yesterday but they were full.  She has been taking her home medications and her pain has been unmanaged with these medications prompting her arrival today.  She denies any fevers or chills.    Sickle Cell Pain Crisis      Prior to Admission medications   Medication Sig Start Date End Date Taking? Authorizing Provider  acetaminophen  (TYLENOL ) 500 MG tablet Take 1,000 mg by mouth every 6 (six) hours as needed for headache (pain).   Yes [provider]  ibuprofen  (ADVIL ) 200 MG tablet Take 800 mg by mouth every 6 (six) hours as needed for headache (pain).   Yes [provider]  methadone  (DOLOPHINE ) 10 MG tablet Take 10-20 mg by mouth See admin instructions. Take 2 tablets (20mg ) by mouth in the morning, take 1 tablet (10mg ) around mid-day and take 1 tablet at bedtime. 03/26/23  Yes [provider]  oxycodone  (ROXICODONE ) 30 MG immediate release tablet Take 30 mg by mouth every 4 (four) hours as needed (severe pain, breakthrough pain).   Yes [provider]  promethazine  (PHENERGAN ) 25 MG tablet Take 25 mg by mouth every 8 (eight) hours as needed for nausea or vomiting.   Yes [provider]  valACYclovir  (VALTREX ) 1000 MG tablet Take 1,000 mg by  mouth daily as needed (flares). 01/15/23  Yes [provider]  naloxone  (NARCAN ) nasal spray 4 mg/0.1 mL Use as needed for opiod overdose Patient not taking: Reported on 11/28/2023 03/04/22   Paseda, Folashade R, FNP    Allergies: Patient has no known allergies.    Review of Systems  All other systems reviewed and are negative.   Updated Vital Signs BP (!) 135/94   Pulse 80   Temp 98.2 F (36.8 C) (Oral)   Resp 19   Ht 5' 5 (1.651 m)   Wt (!) 142.9 kg   LMP 11/25/2023   SpO2 98%   BMI 52.42 kg/m   Physical Exam Vitals and nursing note reviewed.  Constitutional:      General: She is not in acute distress.    Appearance: Normal appearance.  HENT:     Head: Normocephalic and atraumatic.  Eyes:     General:        Right eye: No discharge.        Left eye: No discharge.  Cardiovascular:     Comments: Regular rate and rhythm.  S1/S2 are distinct without any evidence of murmur, rubs, or gallops.  Radial pulses are 2+ bilaterally.  Dorsalis pedis pulses are 2+ bilaterally.  No evidence of pedal edema. Pulmonary:     Comments: Clear to auscultation bilaterally.  Normal effort.  No respiratory distress.  No evidence of wheezes, rales, or rhonchi heard throughout. Abdominal:     General:  Abdomen is flat. Bowel sounds are normal. There is no distension.     Tenderness: There is no abdominal tenderness. There is no guarding or rebound.  Musculoskeletal:        General: Normal range of motion.     Cervical back: Neck supple.  Skin:    General: Skin is warm and dry.     Findings: No rash.  Neurological:     General: No focal deficit present.     Mental Status: She is alert.  Psychiatric:        Mood and Affect: Mood normal.        Behavior: Behavior normal.     (all labs ordered are listed, but only abnormal results are displayed) Labs Reviewed  CBC WITH DIFFERENTIAL/PLATELET - Abnormal; Notable for the following components:      Result Value   Hemoglobin 11.4  (*)    HCT 35.5 (*)    MCV 70.3 (*)    MCH 22.6 (*)    All other components within normal limits  RETICULOCYTES - Abnormal; Notable for the following components:   Immature Retic Fract 25.3 (*)    All other components within normal limits  COMPREHENSIVE METABOLIC PANEL WITH GFR    EKG: None  Radiology: No results found.   Procedures   Medications Ordered in the ED  HYDROmorphone  (DILAUDID ) injection 2 mg (2 mg Intravenous Not Given 11/28/23 1132)  HYDROmorphone  (DILAUDID ) injection 2 mg (2 mg Intravenous Given 11/28/23 1212)  diphenhydrAMINE  (BENADRYL ) injection 12.5 mg (12.5 mg Intravenous Given 11/28/23 1212)    Clinical Course as of 11/28/23 1525  Sat Nov 28, 2023  1518 Trouble with getting IV. Bilateral lower extremity pain, normal for patient for pain crisis. No concerns for avascular necrosis. No chest pain or shortness of breath.  [CB]    Clinical Course User Index [CB] Beola Terrall RAMAN, PA-C    Medical Decision Making Stephanie Burch is a 43 y.o. female patient who presents to the emergency department today for further evaluation of sickle cell pain crisis.  Low suspicion for acute/syndrome or aplastic crisis at this time.  Will get labs to confirm.  Patient not having any chest pain or shortness of breath to indicate any signs of acute chest syndrome.  Low suspicion for DVT at this time.  Will give her 2 mg of Dilaudid  every 30 minutes for 3 doses plus Benadryl  and plan to reassess.  Patient initial IV did blow.  We were able to withdraw labs and give her 1 round of Dilaudid  and then that IV blew.  We are having trouble getting a second IV.  IV team consulted.  We will have to place a second IV to give her her IV pain medications as I offered her subcu and p.o. medications and she declined and she only wants IV medications.  Due to shift change, the rest the patient's care be transferred to oncoming provider.  If patient's pain can get under control with IV medications  after IV is placed she can be discharged if not she is to be mended for additional pain control for sickle cell pain crisis.  Amount and/or Complexity of Data Reviewed Labs: ordered.  Risk Prescription drug management.     Final diagnoses:  None    ED Discharge Orders     None          Theotis Cameron HERO, [place] 11/28/23 1526    Laurice Maude BROCKS, MD 11/29/23 [phone]

## 2023-11-28 NOTE — ED Notes (Signed)
 EDP notified of difficulty getting IV acces on patient.

## 2023-11-28 NOTE — ED Triage Notes (Signed)
 Pt presents with SCC started on Thursday and home meds are not working. Pain in bil legs, Denies shob, cp, N/V/D

## 2023-11-28 NOTE — ED Notes (Signed)
 US  PIV placed in L forearm, 22g 1.75in.

## 2023-11-28 NOTE — H&P (Signed)
 History and Physical    Patient: Stephanie Burch FMW:[id number] DOB: [birth year] DOA: 11/28/2023 DOS: the patient was seen and examined on 11/28/2023 PCP: Pcp, No  Patient coming from: Home  Chief Complaint:  Chief Complaint  Patient presents with   Sickle Cell Pain Crisis   HPI: Stephanie Burch is a 43 y.o. female with medical history significant of sickle cell disease, chronic pain syndrome, anemia of chronic disease, history of depression, who presented to the ER complaining of bilateral lower extremity pain rated as 10 out of 10 consistent with her typical sickle cell disease.  Patient is on chronic methadone  therapy.  She get the methadone  with oxycodone  but no relief.  Patient also received up to 6 mg of IV Dilaudid  in the ER with minimal relief.  Pain is still at 8 out of 10.  She denied any nausea or vomiting no fever or chills.  No sick contact no recent travel.  Patient appears to be having sickle cell pain crisis.  Her H&H and chemistry were stable.  Patient is being admitted with sickle cell pain crisis.  Review of Systems: As mentioned in the history of present illness. All other systems reviewed and are negative. Past Medical History:  Diagnosis Date   Acute kidney injury 11/07/2019   Anemia    Chronic pain    COVID-19 virus detected 10/22/2019   Depression    HCAP (healthcare-associated pneumonia) 11/07/2019   History of blood transfusion 2013   x 2   HSV infection    Hypoxia    Pneumonia    x 1   Pulmonary edema 11/11/2019   Sepsis (HCC) 11/07/2019   Sickle cell anemia (HCC) 09/2021   gets them frequently   Tachycardia with heart rate 100-120 beats per minute    Vitamin B12 deficiency 12/2018   Vitamin D  deficiency    Past Surgical History:  Procedure Laterality Date   CESAREAN SECTION  2013   x 1   TOOTH EXTRACTION N/A 10/18/2021   Procedure: DENTAL RESTORATION/EXTRACTIONS;  Surgeon: Sheryle Hamilton, DMD;  Location: [facility] OR;  Service: Oral Surgery;  Laterality: N/A;    Social History:  reports that she has never smoked. She has never used smokeless tobacco. She reports that she does not currently use alcohol. She reports that she does not use drugs.  No Known Allergies  Family History  Problem Relation Age of Onset   Hypertension Mother    Sickle cell trait Mother    Diabetes Father    Sickle cell trait Father     Prior to Admission medications   Medication Sig Start Date End Date Taking? Authorizing Provider  acetaminophen  (TYLENOL ) 500 MG tablet Take 1,000 mg by mouth every 6 (six) hours as needed for headache (pain).   Yes [provider]  ibuprofen  (ADVIL ) 200 MG tablet Take 800 mg by mouth every 6 (six) hours as needed for headache (pain).   Yes [provider]  methadone  (DOLOPHINE ) 10 MG tablet Take 10-20 mg by mouth See admin instructions. Take 2 tablets (20mg ) by mouth in the morning, take 1 tablet (10mg ) around mid-day and take 1 tablet at bedtime. 03/26/23  Yes [provider]  oxycodone  (ROXICODONE ) 30 MG immediate release tablet Take 30 mg by mouth every 4 (four) hours as needed (severe pain, breakthrough pain).   Yes [provider]  promethazine  (PHENERGAN ) 25 MG tablet Take 25 mg by mouth every 8 (eight) hours as needed for nausea or vomiting.   Yes  [provider]  valACYclovir  (VALTREX ) 1000 MG tablet Take 1,000 mg by mouth daily as needed (flares). 01/15/23  Yes [provider]  naloxone  (NARCAN ) nasal spray 4 mg/0.1 mL Use as needed for opiod overdose Patient not taking: Reported on 11/28/2023 03/04/22   Juanice Thomes SAUNDERS, FNP    Physical Exam: Vitals:   11/28/23 1541 11/28/23 1600 11/28/23 1700 11/28/23 1800  BP: 125/84 (!) 140/124 (!) 127/97 120/76  Pulse: 91 89 83 82  Resp: (!) 21 12 (!) 24 (!) 9  Temp: 98.4 F (36.9 C)   98.1 F (36.7 C)  TempSrc: Oral     SpO2: 97% 98% 98% 96%  Weight:      Height:       Constitutional: Acutely ill looking, NAD, calm,  comfortable Eyes: PERRL, lids and conjunctivae normal ENMT: Mucous membranes are dry. Posterior pharynx clear of any exudate or lesions.Normal dentition.  Neck: normal, supple, no masses, no thyromegaly Respiratory: clear to auscultation bilaterally, no wheezing, no crackles. Normal respiratory effort. No accessory muscle use.  Cardiovascular: Regular rate and rhythm, no murmurs / rubs / gallops. No extremity edema. 2+ pedal pulses. No carotid bruits.  Abdomen: no tenderness, no masses palpated. No hepatosplenomegaly. Bowel sounds positive.  Musculoskeletal: Good range of motion, no joint swelling or tenderness, Skin: no rashes, lesions, ulcers. No induration Neurologic: CN 2-12 grossly intact. Sensation intact, DTR normal. Strength 5/5 in all 4.  Psychiatric: Normal judgment and insight. Alert and oriented x 3. Normal mood  Data Reviewed:  Temperature is 99, blood pressure is 152/115 with pulse 105, chemistry within normal.  Hemoglobin is 11.4 and F[place] count 6.4 with reticulocyte count percentage of 2.6  Assessment and Plan:  #1 sickle cell pain crisis: Patient will be admitted.  Initiate Dilaudid  PCA weight-based, Toradol , IV D5 half-normal at 100 cc an hour.  Continue chronic pain medications and assess.  #2 chronic pain syndrome: Patient is on methadone .  Continue her methadone  in the hospital.  #3 anemia of chronic disease: Hemoglobin is stable at 11.4.  Continue to monitor  #4 morbid obesity: Continue with dietary counseling  #5 pulmonary hypertension: Chronic.  No oxygen  requirement at the moment.  Continue close monitoring.   Advance Care Planning:   Code Status: Full Code   Consults: None  Family Communication: No family at bedside  Severity of Illness: The appropriate patient status for this patient is INPATIENT. Inpatient status is judged to be reasonable and necessary in order to provide the required intensity of service to ensure the patient's safety. The patient's  presenting symptoms, physical exam findings, and initial radiographic and laboratory data in the context of their chronic comorbidities is felt to place them at high risk for further clinical deterioration. Furthermore, it is not anticipated that the patient will be medically stable for discharge from the hospital within 2 midnights of admission.   * I certify that at the point of admission it is my clinical judgment that the patient will require inpatient hospital care spanning beyond 2 midnights from the point of admission due to high intensity of service, high risk for further deterioration and high frequency of surveillance required.*  AuthorBETHA SIM KNOLL, MD 11/28/2023 8:49 PM  For on call review [email or web address].

## 2023-11-28 NOTE — ED Notes (Signed)
 ED providers notfied of Iv team unable to get access on patient. This RN requested for them to attempt access per patient request.

## 2023-11-29 DIAGNOSIS — D5700 Hb-SS disease with crisis, unspecified: Secondary | ICD-10-CM | POA: Diagnosis not present

## 2023-11-29 LAB — CBC WITH DIFFERENTIAL/PLATELET
Abs Immature Granulocytes: 0.01 K/uL (ref 0.00–0.07)
Basophils Absolute: 0 K/uL (ref 0.0–0.1)
Basophils Relative: 1 %
Eosinophils Absolute: 0.2 K/uL (ref 0.0–0.5)
Eosinophils Relative: 3 %
HCT: 32.9 % — ABNORMAL LOW (ref 36.0–46.0)
Hemoglobin: 11.1 g/dL — ABNORMAL LOW (ref 12.0–15.0)
Immature Granulocytes: 0 %
Lymphocytes Relative: 38 %
Lymphs Abs: 2.5 K/uL (ref 0.7–4.0)
MCH: 23.7 pg — ABNORMAL LOW (ref 26.0–34.0)
MCHC: 33.7 g/dL (ref 30.0–36.0)
MCV: 70.3 fL — ABNORMAL LOW (ref 80.0–100.0)
Monocytes Absolute: 0.7 K/uL (ref 0.1–1.0)
Monocytes Relative: 11 %
Neutro Abs: 3 K/uL (ref 1.7–7.7)
Neutrophils Relative %: 47 %
Platelets: 212 K/uL (ref 150–400)
RBC: 4.68 MIL/uL (ref 3.87–5.11)
RDW: 15.2 % (ref 11.5–15.5)
WBC: 6.4 K/uL (ref 4.0–10.5)
nRBC: 0.3 % — ABNORMAL HIGH (ref 0.0–0.2)

## 2023-11-29 LAB — COMPREHENSIVE METABOLIC PANEL WITH GFR
ALT: 9 U/L (ref 0–44)
AST: 16 U/L (ref 15–41)
Albumin: 4 g/dL (ref 3.5–5.0)
Alkaline Phosphatase: 77 U/L (ref 38–126)
Anion gap: 8 (ref 5–15)
BUN: 7 mg/dL (ref 6–20)
CO2: 28 mmol/L (ref 22–32)
Calcium: 9.4 mg/dL (ref 8.9–10.3)
Chloride: 106 mmol/L (ref 98–111)
Creatinine, Ser: 0.8 mg/dL (ref 0.44–1.00)
GFR, Estimated: >60 mL/min (ref >60)
Glucose, Bld: 109 mg/dL — ABNORMAL HIGH (ref 70–99)
Potassium: 4.4 mmol/L (ref 3.5–5.1)
Sodium: 141 mmol/L (ref 135–145)
Total Bilirubin: 0.8 mg/dL (ref 0.0–1.2)
Total Protein: 7.6 g/dL (ref 6.5–8.1)

## 2023-11-29 MED ORDER — OXYCODONE HCL 5 MG PO TABS
30.0000 mg | ORAL_TABLET | ORAL | Status: DC | PRN
  Administered 2023-11-29 – 2023-12-03 (×20): 30 mg via ORAL
  Filled 2023-11-29 (×21): qty 6

## 2023-11-29 MED ORDER — ACETAMINOPHEN 500 MG PO TABS: 1000.0000 mg | ORAL_TABLET | Freq: Four times a day (QID) | ORAL | Status: DC | PRN

## 2023-11-29 NOTE — Progress Notes (Signed)
 Patient ID: Stephanie Burch, female   DOB: 11-15-1980, 43 y.o.   MRN: [id number] Subjective: Stephanie Burch is a 43 y.o. female with medical history significant of sickle cell disease, chronic pain syndrome, anemia of chronic disease, history of depression, who presented to the ER complaining of bilateral lower extremity pain rated as 10 out of 10 consistent with her typical sickle cell disease.  Patient was admitted for sickle cell pain crisis.  Today patient has no new complaint.  She says she is still in significant amount of pain, rating her pain at 8/10, generalized.  It took a while to get her IV access yesterday, and that made the pain worse before she got admitted to the hospital.  She has no redness or swelling of any joint.  She denies any fever, cough, chest pain, shortness of breath, nausea, vomiting or diarrhea.  No urinary symptoms.  Objective:  Vital signs in last 24 hours:  Vitals:   11/29/23 0551 11/29/23 0744 11/29/23 0904 11/29/23 1057  BP: 130/70  122/79   Pulse: 79  77   Resp: 14 12 16 12   Temp: 97.9 F (36.6 C)  98.4 F (36.9 C)   TempSrc: Oral  Oral   SpO2: 93% 95% 98% 98%  Weight:      Height:        Intake/Output from previous day:   Intake/Output Summary (Last 24 hours) at 11/29/2023 1239 Last data filed at 11/29/2023 0543 Gross per 24 hour  Intake 856.13 ml  Output --  Net 856.13 ml    Physical Exam: General: Alert, awake, oriented x3, in no acute distress.  Morbidly obese HEENT: [place]/AT PEERL, EOMI Neck: Trachea midline,  no masses, no thyromegal,y no JVD, no carotid bruit OROPHARYNX:  Moist, No exudate/ erythema/lesions.  Heart: Regular rate and rhythm, without murmurs, rubs, gallops, PMI non-displaced, no heaves or thrills on palpation.  Lungs: Clear to auscultation, no wheezing or rhonchi noted. No increased vocal fremitus resonant to percussion  Abdomen: Soft, nontender, nondistended, positive bowel sounds, no masses no hepatosplenomegaly  noted..  Neuro: No focal neurological deficits noted cranial nerves II through XII grossly intact. DTRs 2+ bilaterally upper and lower extremities. Strength 5 out of 5 in bilateral upper and lower extremities. Musculoskeletal: No warm swelling or erythema around joints, no spinal tenderness noted. Psychiatric: Patient alert and oriented x3, good insight and cognition, good recent to remote recall. Lymph node survey: No cervical axillary or inguinal lymphadenopathy noted.  Lab Results:  Basic Metabolic Panel:    Component Value Date/Time   NA 141 11/29/2023 0747   NA 142 09/11/2017 1431   K 4.4 11/29/2023 0747   CL 106 11/29/2023 0747   CO2 28 11/29/2023 0747   BUN 7 11/29/2023 0747   BUN 8 09/11/2017 1431   CREATININE 0.80 11/29/2023 0747   GLUCOSE 109 (H) 11/29/2023 0747   CALCIUM 9.4 11/29/2023 0747   CBC:    Component Value Date/Time   WBC 6.4 11/29/2023 0747   HGB 11.1 (L) 11/29/2023 0747   HGB 13.0 09/11/2017 1431   HCT 32.9 (L) 11/29/2023 0747   HCT 38.4 09/11/2017 1431   PLT 212 11/29/2023 0747   PLT 258 09/11/2017 1431   MCV 70.3 (L) 11/29/2023 0747   MCV 75 (L) 09/11/2017 1431   NEUTROABS 3.0 11/29/2023 0747   NEUTROABS 3.9 09/11/2017 1431   LYMPHSABS 2.5 11/29/2023 0747   LYMPHSABS 2.2 09/11/2017 1431   MONOABS 0.7 11/29/2023 0747   EOSABS 0.2 11/29/2023  0747   EOSABS 0.1 09/11/2017 1431   BASOSABS 0.0 11/29/2023 0747   BASOSABS 0.0 09/11/2017 1431    No results found for this or any previous visit (from the past 240 hours).  Studies/Results: No results found.  Medications: Scheduled Meds:  enoxaparin  (LOVENOX ) injection  40 mg Subcutaneous Q24H   HYDROmorphone    Intravenous Q4H   ketorolac   15 mg Intravenous Q6H   methadone   10 mg Oral BID   methadone   20 mg Oral Daily   senna-docusate  1 tablet Oral BID   Continuous Infusions:  dextrose  5 % and 0.45 % NaCl 100 mL/hr at 11/29/23 0543   PRN Meds:.diphenhydrAMINE , naloxone  **AND** sodium chloride   flush, ondansetron  (ZOFRAN ) IV, polyethylene glycol  Consultants: None  Procedures: None  Antibiotics: None  Assessment/Plan: Active Problems:   Leukocytosis   BMI 50.0-59.9, adult (HCC)   Chronic pain syndrome   Sickle cell pain crisis (HCC)   Pulmonary hypertension (HCC)   Anemia of chronic disease  Hb Sickle Cell Disease with Pain crisis: Reduce IV fluids to KVO.  Continue weight based Dilaudid  PCA at current dose setting, continue IV Toradol  15 mg Q 6 H for a total of 5 days, continue oral home pain medications as ordered. Monitor vitals very closely, Re-evaluate pain scale regularly, 2 L of Oxygen  by [place]. Patient encouraged to ambulate on the hallway today.  Leukocytosis:  Anemia of Chronic Disease: Hemoglobin is stable at baseline today.  There is no clinical indication for blood transfusion at this time.  Will continue to monitor closely and transfuse as appropriate. Chronic pain Syndrome: Patient is on methadone  for pain control. Will continue. Pulmonary hypertension: Chronic. Patient does not require any supplemental oxygen  beyond the routine for being on Dilaudid  via PCA. Continue incentive spirometry. Morbid obesity: Patient's BMI is 52.42 kg/m.  Patient was counseled extensively about dietary control and lifestyle modification.  Patient not able to exercise due to chronic pain.  Patient may need outpatient referral to dietitian/nutritionist/weight loss specialist.  Code Status: Full Code Family Communication: N/A Disposition Plan: Not yet ready for discharge  Adelee Hannula  If 7PM-7AM, please contact night-coverage.  11/29/2023, 12:39 PM  LOS: 1 day

## 2023-11-29 NOTE — Plan of Care (Signed)
  Problem: Education: Goal: Awareness of infection prevention will improve Outcome: Progressing   Problem: Education: Goal: Knowledge of General Education information will improve Description: Including pain rating scale, medication(s)/side effects and non-pharmacologic comfort measures Outcome: Progressing

## 2023-11-29 NOTE — Plan of Care (Signed)
  Problem: Sensory: Goal: Pain level will decrease with appropriate interventions Outcome: Progressing   Problem: Education: Goal: Knowledge of General Education information will improve Description: Including pain rating scale, medication(s)/side effects and non-pharmacologic comfort measures Outcome: Progressing   Problem: Health Behavior/Discharge Planning: Goal: Ability to manage health-related needs will improve Outcome: Progressing   Problem: Activity: Goal: Risk for activity intolerance will decrease Outcome: Progressing   Problem: Pain Managment: Goal: General experience of comfort will improve and/or be controlled Outcome: Progressing

## 2023-11-30 NOTE — Plan of Care (Signed)
  Problem: Sensory: Goal: Pain level will decrease with appropriate interventions Outcome: Progressing   Problem: Education: Goal: Knowledge of General Education information will improve Description: Including pain rating scale, medication(s)/side effects and non-pharmacologic comfort measures Outcome: Progressing   Problem: Activity: Goal: Risk for activity intolerance will decrease Outcome: Progressing   Problem: Nutrition: Goal: Adequate nutrition will be maintained Outcome: Progressing   Problem: Coping: Goal: Level of anxiety will decrease Outcome: Progressing   Problem: Pain Managment: Goal: General experience of comfort will improve and/or be controlled Outcome: Progressing

## 2023-11-30 NOTE — Progress Notes (Signed)
 Patient ID: Stephanie Burch, female   DOB: [birth year], 43 y.o.   MRN: [id number] Subjective: Stephanie Burch is a 43 y.o. female with medical history significant of sickle cell disease, chronic pain syndrome, anemia of chronic disease, history of depression, who presented to the ER complaining of bilateral lower extremity pain rated as 10 out of 10 consistent with her typical sickle cell disease.  Patient was admitted for sickle cell pain crisis.   Patient is sitting up in bed with her breakfast tray and has no new complaint. She says she is still in significant amount of pain, rating her pain at 9/10, generalized.  She denies any fever, cough, chest pain, shortness of breath, nausea, vomiting or diarrhea.  No urinary symptoms.  Objective:  Vital signs in last 24 hours:  Vitals:   11/30/23 0645 11/30/23 0747 11/30/23 1003 11/30/23 1031  BP: (!) 130/98   (!) 135/91  Pulse: 93   92  Resp: 18 14 12 16   Temp: 98 F (36.7 C)   (!) 97.5 F (36.4 C)  TempSrc:      SpO2: 94% 96% 97% 99%  Weight:      Height:        Intake/Output from previous day:   Intake/Output Summary (Last 24 hours) at 11/30/2023 1154 Last data filed at 11/29/2023 1519 Gross per 24 hour  Intake 480 ml  Output --  Net 480 ml    Physical Exam: General: Alert, awake, oriented x3, in no acute distress.  Morbidly obese HEENT: [place]/AT PEERL, EOMI Neck: Trachea midline,  no masses, no thyromegal,y no JVD, no carotid bruit OROPHARYNX:  Moist, No exudate/ erythema/lesions.  Heart: Regular rate and rhythm, without murmurs, rubs, gallops, PMI non-displaced, no heaves or thrills on palpation.  Lungs: Clear to auscultation, no wheezing or rhonchi noted. No increased vocal fremitus resonant to percussion  Abdomen: Soft, nontender, nondistended, positive bowel sounds, no masses no hepatosplenomegaly noted..  Neuro: No focal neurological deficits noted cranial nerves II through XII grossly intact. DTRs 2+ bilaterally upper and  lower extremities. Strength 5 out of 5 in bilateral upper and lower extremities. Musculoskeletal: No warm swelling or erythema around joints, no spinal tenderness noted. Psychiatric: Patient alert and oriented x3, good insight and cognition, good recent to remote recall. Lymph node survey: No cervical axillary or inguinal lymphadenopathy noted.  Lab Results:  Basic Metabolic Panel:    Component Value Date/Time   NA 141 11/29/2023 0747   NA 142 09/11/2017 1431   K 4.4 11/29/2023 0747   CL 106 11/29/2023 0747   CO2 28 11/29/2023 0747   BUN 7 11/29/2023 0747   BUN 8 09/11/2017 1431   CREATININE 0.80 11/29/2023 0747   GLUCOSE 109 (H) 11/29/2023 0747   CALCIUM 9.4 11/29/2023 0747   CBC:    Component Value Date/Time   WBC 6.4 11/29/2023 0747   HGB 11.1 (L) 11/29/2023 0747   HGB 13.0 09/11/2017 1431   HCT 32.9 (L) 11/29/2023 0747   HCT 38.4 09/11/2017 1431   PLT 212 11/29/2023 0747   PLT 258 09/11/2017 1431   MCV 70.3 (L) 11/29/2023 0747   MCV 75 (L) 09/11/2017 1431   NEUTROABS 3.0 11/29/2023 0747   NEUTROABS 3.9 09/11/2017 1431   LYMPHSABS 2.5 11/29/2023 0747   LYMPHSABS 2.2 09/11/2017 1431   MONOABS 0.7 11/29/2023 0747   EOSABS 0.2 11/29/2023 0747   EOSABS 0.1 09/11/2017 1431   BASOSABS 0.0 11/29/2023 0747   BASOSABS 0.0 09/11/2017 1431  No results found for this or any previous visit (from the past 240 hours).  Studies/Results: No results found.  Medications: Scheduled Meds:  enoxaparin  (LOVENOX ) injection  40 mg Subcutaneous Q24H   HYDROmorphone    Intravenous Q4H   ketorolac   15 mg Intravenous Q6H   methadone   10 mg Oral BID   methadone   20 mg Oral Daily   senna-docusate  1 tablet Oral BID   Continuous Infusions:   PRN Meds:.acetaminophen , diphenhydrAMINE , naloxone  **AND** sodium chloride  flush, ondansetron  (ZOFRAN ) IV, oxycodone , polyethylene glycol  Consultants: None  Procedures: None  Antibiotics: None  Assessment/Plan: Active Problems:    Leukocytosis   BMI 50.0-59.9, adult (HCC)   Chronic pain syndrome   Sickle cell pain crisis (HCC)   Pulmonary hypertension (HCC)   Anemia of chronic disease  Hb Sickle Cell Disease with Pain crisis: Ongoing pain continue IV fluids to KVO.  Continue weight based Dilaudid  PCA at current dose setting, continue IV Toradol  15 mg Q 6 H for a total of 5 days, continue oral home pain medications as ordered. Monitor vitals very closely, Re-evaluate pain scale regularly, 2 L of Oxygen  by [place]. Patient encouraged to ambulate on the hallway today.   Anemia of Chronic Disease: Hemoglobin is stable at baseline today.  There is no clinical indication for blood transfusion at this time.  Will continue to monitor closely and transfuse as appropriate. Chronic pain Syndrome: Continue methadone  as prescribed. Pulmonary hypertension: Chronic. Patient does not require any supplemental oxygen  beyond the routine for being on Dilaudid  via PCA. Continue incentive spirometry. Morbid obesity: Patient's BMI is 52.42 kg/m.  Patient was counseled extensively about dietary control and lifestyle modification.  Patient not able to exercise due to chronic pain.  Patient may need outpatient referral to dietitian/nutritionist/weight loss specialist.  Code Status: Full Code Family Communication: N/A Disposition Plan: Not yet ready for discharge  Homer CHRISTELLA Cover NP  If 7PM-7AM, please contact night-coverage.  11/30/2023, 11:54 AM  LOS: 2 days

## 2023-11-30 NOTE — Plan of Care (Signed)
  Problem: Self-Care: Goal: Ability to incorporate actions that prevent/reduce pain crisis will improve Outcome: Progressing   Problem: Sensory: Goal: Pain level will decrease with appropriate interventions Outcome: Progressing   Problem: Education: Goal: Knowledge of General Education information will improve Description: Including pain rating scale, medication(s)/side effects and non-pharmacologic comfort measures Outcome: Progressing   Problem: Health Behavior/Discharge Planning: Goal: Ability to manage health-related needs will improve Outcome: Progressing

## 2023-12-01 NOTE — Progress Notes (Signed)
   12/01/23 0852  TOC Brief Assessment  Insurance and Status Reviewed  Patient has primary care physician Yes  Home environment has been reviewed home  Prior level of function: independent  Prior/Current Home Services No current home services  Social Drivers of Health Review SDOH reviewed no interventions necessary  Readmission risk has been reviewed Yes  Transition of care needs no transition of care needs at this time

## 2023-12-01 NOTE — Plan of Care (Signed)
  Problem: Sensory: Goal: Pain level will decrease with appropriate interventions Outcome: Progressing   Problem: Activity: Goal: Risk for activity intolerance will decrease Outcome: Progressing   Problem: Nutrition: Goal: Adequate nutrition will be maintained Outcome: Progressing   Problem: Coping: Goal: Level of anxiety will decrease Outcome: Progressing

## 2023-12-01 NOTE — Telephone Encounter (Signed)
 Error

## 2023-12-01 NOTE — Plan of Care (Signed)
  Problem: Sensory: Goal: Pain level will decrease with appropriate interventions Outcome: Progressing   Problem: Activity: Goal: Risk for activity intolerance will decrease Outcome: Progressing   Problem: Nutrition: Goal: Adequate nutrition will be maintained Outcome: Progressing   Problem: Coping: Goal: Level of anxiety will decrease Outcome: Progressing   Problem: Pain Managment: Goal: General experience of comfort will improve and/or be controlled Outcome: Progressing   Problem: Skin Integrity: Goal: Risk for impaired skin integrity will decrease Outcome: Progressing

## 2023-12-01 NOTE — Progress Notes (Signed)
 Patient ID: Stephanie Burch, female   DOB: 1980/07/29, 43 y.o.   MRN: [id number] Subjective: Stephanie Burch is a 43 y.o. female with medical history significant of sickle cell disease, chronic pain syndrome, anemia of chronic disease, history of depression, who presented to the ER complaining of bilateral lower extremity pain rated as 10 out of 10 consistent with her typical sickle cell disease.  Patient was admitted for sickle cell pain crisis.  Patient is reporting improved pain today of 7/10.  Patient states she is improving and will hopefully be ready for discharge in the morning.  She denies subjective fever, cough, chest pain, shortness of breath, nausea, vomiting or diarrhea.  No urinary symptoms.  Objective:  Vital signs in last 24 hours:  Vitals:   12/01/23 0859 12/01/23 1013 12/01/23 1301 12/01/23 1343  BP:  123/75  115/60  Pulse:  91  84  Resp: 17 16 15 16   Temp:  97.7 F (36.5 C)  97.7 F (36.5 C)  TempSrc:    Oral  SpO2: 96% 96% 96% 99%  Weight:      Height:        Intake/Output from previous day:  No intake or output data in the 24 hours ending 12/01/23 1426   Physical Exam: General: Alert, awake, oriented x3, in no acute distress.  Morbidly obese HEENT: [place]/AT [place]ER[place], EOMI Neck: Trachea midline,  no masses, no thyromegal,y no JVD, no carotid bruit OROPHARYNX:  Moist, No exudate/ erythema/lesions.  Heart: Regular rate and rhythm, without murmurs, rubs, gallops, PMI non-displaced, no heaves or thrills on palpation.  Lungs: Clear to auscultation, no wheezing or rhonchi noted. No increased vocal fremitus resonant to percussion  Abdomen: Soft, nontender, nondistended, positive bowel sounds, no masses no hepatosplenomegaly noted..  Neuro: No focal neurological deficits noted cranial nerves II through XII grossly intact. DTRs 2+ bilaterally upper and lower extremities. Strength 5 out of 5 in bilateral upper and lower extremities. Musculoskeletal: Generalized body  tenderness.   Psychiatric: Patient alert and oriented x3, good insight and cognition, good recent to remote recall. Lymph node survey: No cervical axillary or inguinal lymphadenopathy noted.  Lab Results:  Basic Metabolic Panel:    Component Value Date/Time   NA 141 11/29/2023 0747   NA 142 09/11/2017 1431   K 4.4 11/29/2023 0747   CL 106 11/29/2023 0747   CO2 28 11/29/2023 0747   BUN 7 11/29/2023 0747   BUN 8 09/11/2017 1431   CREATININE 0.80 11/29/2023 0747   GLUCOSE 109 (H) 11/29/2023 0747   CALCIUM 9.4 11/29/2023 0747   CBC:    Component Value Date/Time   WBC 6.4 11/29/2023 0747   HGB 11.1 (L) 11/29/2023 0747   HGB 13.0 09/11/2017 1431   HCT 32.9 (L) 11/29/2023 0747   HCT 38.4 09/11/2017 1431   PLT 212 11/29/2023 0747   PLT 258 09/11/2017 1431   MCV 70.3 (L) 11/29/2023 0747   MCV 75 (L) 09/11/2017 1431   NEUTROABS 3.0 11/29/2023 0747   NEUTROABS 3.9 09/11/2017 1431   LYMPHSABS 2.5 11/29/2023 0747   LYMPHSABS 2.2 09/11/2017 1431   MONOABS 0.7 11/29/2023 0747   EOSABS 0.2 11/29/2023 0747   EOSABS 0.1 09/11/2017 1431   BASOSABS 0.0 11/29/2023 0747   BASOSABS 0.0 09/11/2017 1431    No results found for this or any previous visit (from the past 240 hours).  Studies/Results: No results found.  Medications: Scheduled Meds:  enoxaparin  (LOVENOX ) injection  40 mg Subcutaneous Q24H   HYDROmorphone   Intravenous Q4H   ketorolac   15 mg Intravenous Q6H   methadone   10 mg Oral BID   methadone   20 mg Oral Daily   senna-docusate  1 tablet Oral BID   Continuous Infusions:   PRN Meds:.acetaminophen , diphenhydrAMINE , naloxone  **AND** sodium chloride  flush, ondansetron  (ZOFRAN ) IV, oxycodone , polyethylene glycol  Consultants: None  Procedures: None  Antibiotics: None  Assessment/Plan: Active Problems:   Leukocytosis   BMI 50.0-59.9, adult (HCC)   Chronic pain syndrome   Sickle cell pain crisis (HCC)   Pulmonary hypertension (HCC)   Anemia of chronic  disease  Hb Sickle Cell Disease with Pain crisis: Pain is gradually improving, continue IV fluids at KVO.  Continue weight based Dilaudid  PCA at current dose setting, continue IV Toradol  15 mg Q 6 H for a total of 5 days, continue oral home pain medications as ordered. Monitor vitals very closely, Re-evaluate pain scale regularly, 2 L of Oxygen  by [place]. Patient encouraged to ambulate on the hallway today.   Anemia of Chronic Disease: Hemoglobin is stable at baseline today.  There is no clinical indication for blood transfusion at this time.  Will continue to monitor closely and transfuse as appropriate. Chronic pain Syndrome: Continue methadone  as prescribed. Pulmonary hypertension: Chronic. Patient does not require any supplemental oxygen  beyond the routine for being on Dilaudid  via PCA. Continue incentive spirometry. Morbid obesity: Patient's BMI is 52.42 kg/m.  Patient was counseled extensively about dietary control and lifestyle modification.  Patient is not able to exercise due to chronic pain.  Patient may need outpatient referral to dietitian/nutritionist/weight loss specialist.  Code Status: Full Code Family Communication: N/A Disposition Plan: Not yet ready for discharge  Homer CHRISTELLA Cover NP  If 7PM-7AM, please contact night-coverage.  12/01/2023, 2:26 PM  LOS: 3 days

## 2023-12-02 LAB — CBC
HCT: 36.3 % (ref 36.0–46.0)
Hemoglobin: 11 g/dL — ABNORMAL LOW (ref 12.0–15.0)
MCH: 22.5 pg — ABNORMAL LOW (ref 26.0–34.0)
MCHC: 30.3 g/dL (ref 30.0–36.0)
MCV: 74.4 fL — ABNORMAL LOW (ref 80.0–100.0)
Platelets: 242 K/uL (ref 150–400)
RBC: 4.88 MIL/uL (ref 3.87–5.11)
RDW: 15.1 % (ref 11.5–15.5)
WBC: 10.2 K/uL (ref 4.0–10.5)
nRBC: 1 % — ABNORMAL HIGH (ref 0.0–0.2)

## 2023-12-02 LAB — BASIC METABOLIC PANEL WITH GFR
Anion gap: 10 (ref 5–15)
BUN: 8 mg/dL (ref 6–20)
CO2: 22 mmol/L (ref 22–32)
Calcium: 9 mg/dL (ref 8.9–10.3)
Chloride: 108 mmol/L (ref 98–111)
Creatinine, Ser: 0.66 mg/dL (ref 0.44–1.00)
GFR, Estimated: >60 mL/min (ref >60)
Glucose, Bld: 87 mg/dL (ref 70–99)
Potassium: 4.1 mmol/L (ref 3.5–5.1)
Sodium: 140 mmol/L (ref 135–145)

## 2023-12-02 NOTE — Plan of Care (Signed)

## 2023-12-02 NOTE — Progress Notes (Signed)
 Patient ID: Stephanie Burch, female   DOB: 03-31-1980, 43 y.o.   MRN: [id number] Subjective: Stephanie Burch is a 43 y.o. female with medical history significant of sickle cell disease, chronic pain syndrome, anemia of chronic disease, history of depression, who presented to the ER complaining of bilateral lower extremity pain rated as 10 out of 10 consistent with her typical sickle cell disease.  Patient was admitted for sickle cell pain crisis.  Patient is endorsing pain of 7/10 this morning,  Patient states she is improving and will hopefully be ready for discharge tomorrow morning as she felt worse overnight. She denies subjective fever, cough, chest pain, shortness of breath, nausea, vomiting or diarrhea.  No urinary symptoms.  Objective:  Vital signs in last 24 hours:  Vitals:   12/02/23 0351 12/02/23 0537 12/02/23 0828 12/02/23 1020  BP:  133/86  (!) 140/103  Pulse:  75  95  Resp: 16 16 12 16   Temp:  98.4 F (36.9 C)  97.6 F (36.4 C)  TempSrc:  Oral  Oral  SpO2:  99% 98% 100%  Weight:      Height:        Intake/Output from previous day:  No intake or output data in the 24 hours ending 12/02/23 1135   Physical Exam: General: Alert, awake, oriented x3, in no acute distress.  Morbidly obese HEENT: [place]/AT [place]ER[place], EOMI Neck: Trachea midline,  no masses, no thyromegal,y no JVD, no carotid bruit OROPHARYNX:  Moist, No exudate/ erythema/lesions.  Heart: Regular rate and rhythm, without murmurs, rubs, gallops, PMI non-displaced, no heaves or thrills on palpation.  Lungs: Clear to auscultation, no wheezing or rhonchi noted. No increased vocal fremitus resonant to percussion  Abdomen: Soft, nontender, nondistended, positive bowel sounds, no masses no hepatosplenomegaly noted..  Neuro: No focal neurological deficits noted cranial nerves II through XII grossly intact. DTRs 2+ bilaterally upper and lower extremities. Strength 5 out of 5 in bilateral upper and lower  extremities. Musculoskeletal: Generalized body tenderness.   Psychiatric: Patient alert and oriented x3, good insight and cognition, good recent to remote recall. Lymph node survey: No cervical axillary or inguinal lymphadenopathy noted.  Lab Results:  Basic Metabolic Panel:    Component Value Date/Time   NA 141 11/29/2023 0747   NA 142 09/11/2017 1431   K 4.4 11/29/2023 0747   CL 106 11/29/2023 0747   CO2 28 11/29/2023 0747   BUN 7 11/29/2023 0747   BUN 8 09/11/2017 1431   CREATININE 0.80 11/29/2023 0747   GLUCOSE 109 (H) 11/29/2023 0747   CALCIUM 9.4 11/29/2023 0747   CBC:    Component Value Date/Time   WBC 10.2 12/02/2023 1037   HGB 11.0 (L) 12/02/2023 1037   HGB 13.0 09/11/2017 1431   HCT 36.3 12/02/2023 1037   HCT 38.4 09/11/2017 1431   PLT 242 12/02/2023 1037   PLT 258 09/11/2017 1431   MCV 74.4 (L) 12/02/2023 1037   MCV 75 (L) 09/11/2017 1431   NEUTROABS 3.0 11/29/2023 0747   NEUTROABS 3.9 09/11/2017 1431   LYMPHSABS 2.5 11/29/2023 0747   LYMPHSABS 2.2 09/11/2017 1431   MONOABS 0.7 11/29/2023 0747   EOSABS 0.2 11/29/2023 0747   EOSABS 0.1 09/11/2017 1431   BASOSABS 0.0 11/29/2023 0747   BASOSABS 0.0 09/11/2017 1431    No results found for this or any previous visit (from the past 240 hours).  Studies/Results: No results found.  Medications: Scheduled Meds:  enoxaparin  (LOVENOX ) injection  40 mg Subcutaneous Q24H  HYDROmorphone    Intravenous Q4H   ketorolac   15 mg Intravenous Q6H   methadone   10 mg Oral BID   methadone   20 mg Oral Daily   senna-docusate  1 tablet Oral BID   Continuous Infusions:   PRN Meds:.acetaminophen , diphenhydrAMINE , naloxone  **AND** sodium chloride  flush, ondansetron  (ZOFRAN ) IV, oxycodone , polyethylene glycol  Consultants: None  Procedures: None  Antibiotics: None  Assessment/Plan: Active Problems:   Leukocytosis   BMI 50.0-59.9, adult (HCC)   Chronic pain syndrome   Sickle cell pain crisis (HCC)   Pulmonary  hypertension (HCC)   Anemia of chronic disease  Hb Sickle Cell Disease with Pain crisis: Pain is gradually improving however she is not at baseline. continue IV fluids at KVO.  Continue weight based Dilaudid  PCA at current dose setting, continue IV Toradol  15 mg Q 6 H for a total of 5 days, continue oral home pain medications as ordered. Monitor vitals very closely, Re-evaluate pain scale regularly, 2 L of Oxygen  by . Patient encouraged to ambulate on the hallway today.   Anemia of Chronic Disease: Hemoglobin is stable at baseline today.  There is no clinical indication for blood transfusion at this time.  Will continue to monitor closely and transfuse as appropriate. Chronic pain Syndrome: Continue methadone  as prescribed. Pulmonary hypertension: Chronic. Patient does not require any supplemental oxygen  beyond the routine for being on Dilaudid  via PCA. Continue incentive spirometry. Morbid obesity: Patient's BMI is 52.42 kg/m.  Patient was counseled extensively about dietary control and lifestyle modification.  Patient is not able to exercise due to chronic pain.  Patient may need outpatient referral to dietitian/nutritionist/weight loss specialist.  Code Status: Full Code Family Communication: N/A Disposition Plan: Not yet ready for discharge  Homer CHRISTELLA Cover NP  If 7PM-7AM, please contact night-coverage.  12/02/2023, 11:35 AM  LOS: 4 days

## 2023-12-02 NOTE — Plan of Care (Signed)
  Problem: Health Behavior: Goal: Postive changes in compliance with treatment and prescription regimens will improve Outcome: Progressing   Problem: Health Behavior/Discharge Planning: Goal: Ability to manage health-related needs will improve Outcome: Progressing   Problem: Pain Managment: Goal: General experience of comfort will improve and/or be controlled Outcome: Progressing   Problem: Safety: Goal: Ability to remain free from injury will improve Outcome: Progressing

## 2023-12-03 NOTE — Plan of Care (Signed)

## 2023-12-03 NOTE — Discharge Summary (Signed)
 Physician Discharge Summary  Stephanie Burch FMW:[id number] DOB: 08/24/1980 DOA: 11/28/2023  PCP: Pcp, No  Admit date: 11/28/2023  Discharge date: 12/03/2023  Discharge Diagnoses:  Active Problems:   Leukocytosis   BMI 50.0-59.9, adult (HCC)   Chronic pain syndrome   Sickle cell pain crisis (HCC)   Pulmonary hypertension (HCC)   Anemia of chronic disease   Discharge Condition: Stable  Disposition:  Pt is discharged home in good condition and is to follow up with Pcp, No this week to have labs evaluated. Stephanie Burch is instructed to increase activity slowly and balance with rest for the next few days, and use prescribed medication to complete treatment of pain  Diet: Regular Wt Readings from Last 3 Encounters:  11/28/23 (!) 142.9 kg  11/05/23 (!) 142.9 kg  10/01/23 (!) 142.9 kg    History of present illness:  Stephanie Burch is a 43 y.o. female with history of sickle cell disease, chronic pain syndrome, opiate dependence and tolerance, obesity, and anemia of chronic disease presents to the ED with complaints of generalized pain, that is consistent with her typical pain crisis. Patient says that pain intensity has been elevated over the past few days and unrelieved by her home medications. Pain is worse in bilateral extremities and back. Patient last had oxycodone  30 mg and methadone  10 mg prior to ED visit without sustained relief. Patient rates pain as 10/10, constant and throbbing. Patient denies fever, chills, chest pain, shortness of breath, nausea, vomiting, or diarrhea. No sick contacts or recent travel no urinary symptoms.    ED Course:  BP (!) 135/94   Pulse 80   Temp 98.2 F (36.8 C) (Oral)   Resp 19   Ht 5' 5 (1.651 m)   Wt (!) 142.9 kg   LMP 11/25/2023   SpO2 98%   BMI 52.42 kg/m  Hgb 11.4, HCT 35.5, Immature Retic fract 25.3 In the ED, Patient was treated with IVF, multiple doses of IV dilaudid  with no resolution to pain symptoms. Patient was therefore  admitted inpatient for ongoing sickle cell pain crisis management.   Hospital Course:  Patient was admitted for sickle cell pain crisis and managed appropriately with IVF, IV Dilaudid  via PCA and IV Toradol , patient showed significant improvement to pain and at baseline today. She ambulated independently and is tolerating PO well. She asked to be discharged home to celebrate her sons birthday today. Patient was therefore discharged home in a hemodynamically stable condition.   Stephanie Burch will follow-up with PCP within 1 week of this discharge. Stephanie Burch was counseled extensively about nonpharmacologic means of pain management, patient verbalized understanding and was appreciative of  the care received during this admission.   We discussed the need for good hydration, monitoring of hydration status, avoidance of heat, cold, stress, and infection triggers. We discussed the need to be adherent with taking her home medications. Patient was reminded of the need to seek medical attention immediately if any symptom of bleeding, anemia, or infection occurs.  Discharge Exam: Vitals:   12/03/23 0808 12/03/23 1013  BP:  (!) 135/99  Pulse:  94  Resp: 16 16  Temp:  97.8 F (36.6 C)  SpO2:  100%   Vitals:   12/03/23 0150 12/03/23 0434 12/03/23 0808 12/03/23 1013  BP: 121/68   (!) 135/99  Pulse: 87   94  Resp: 18 14 16 16   Temp: 97.7 F (36.5 C)   97.8 F (36.6 C)  TempSrc: Oral   Oral  SpO2: 97%   100%  Weight:      Height:        General appearance : Awake, alert, not in any distress. Speech Clear. Not toxic looking HEENT: Atraumatic and Normocephalic, pupils equally reactive to light and accomodation Neck: Supple, no JVD. No cervical lymphadenopathy.  Chest: Good air entry bilaterally, no added sounds  CVS: S1 S2 regular, no murmurs.  Abdomen: Bowel sounds present, Non tender and not distended with no gaurding, rigidity or rebound. Extremities: B/L Lower Ext shows no edema, both legs are warm  to touch Neurology: Awake alert, and oriented X 3, CN II-XII intact, Non focal Skin: No Rash  Discharge Instructions  Discharge Instructions     Call MD for:  severe uncontrolled pain   Complete by: As directed    Call MD for:  temperature >100.4   Complete by: As directed    Diet - low sodium heart healthy   Complete by: As directed    Increase activity slowly   Complete by: As directed       Allergies as of 12/03/2023   No Known Allergies      Medication List     TAKE these medications    acetaminophen  500 MG tablet Commonly known as: TYLENOL  Take 1,000 mg by mouth every 6 (six) hours as needed for headache (pain).   ibuprofen  200 MG tablet Commonly known as: ADVIL  Take 800 mg by mouth every 6 (six) hours as needed for headache (pain).   methadone  10 MG tablet Commonly known as: DOLOPHINE  Take 10-20 mg by mouth See admin instructions. Take 2 tablets (20mg ) by mouth in the morning, take 1 tablet (10mg ) around mid-day and take 1 tablet at bedtime.   naloxone  4 MG/0.1ML Liqd nasal spray kit Commonly known as: NARCAN  Use as needed for opiod overdose   oxycodone  30 MG immediate release tablet Commonly known as: ROXICODONE  Take 30 mg by mouth every 4 (four) hours as needed (severe pain, breakthrough pain).   promethazine  25 MG tablet Commonly known as: PHENERGAN  Take 25 mg by mouth every 8 (eight) hours as needed for nausea or vomiting.   valACYclovir  1000 MG tablet Commonly known as: VALTREX  Take 1,000 mg by mouth daily as needed (flares).        The results of significant diagnostics from this hospitalization (including imaging, microbiology, ancillary and laboratory) are listed below for reference.    Significant Diagnostic Studies: No results found.  Microbiology: No results found for this or any previous visit (from the past 240 hours).   Labs: Basic Metabolic Panel: Recent Labs  Lab 11/28/23 1450 11/29/23 0747 12/02/23 1037  NA 141 141 140   K 4.2 4.4 4.1  CL 105 106 108  CO2 25 28 22   GLUCOSE 86 109* 87  BUN <5* 7 8  CREATININE 0.68 0.80 0.66  CALCIUM 9.8 9.4 9.0   Liver Function Tests: Recent Labs  Lab 11/28/23 1450 11/29/23 0747  AST 30 16  ALT 13 9  ALKPHOS 83 77  BILITOT 0.8 0.8  PROT 8.1 7.6  ALBUMIN 4.1 4.0   No results for input(s): LIPASE, AMYLASE in the last 168 hours. No results for input(s): AMMONIA in the last 168 hours. CBC: Recent Labs  Lab 11/28/23 0941 11/29/23 0747 12/02/23 1037  WBC 6.4 6.4 10.2  NEUTROABS 4.2 3.0  --   HGB 11.4* 11.1* 11.0*  HCT 35.5* 32.9* 36.3  MCV 70.3* 70.3* 74.4*  PLT 231 212 242   Cardiac Enzymes:  No results for input(s): CKTOTAL, CKMB, CKMBINDEX, TROPONINI in the last 168 hours. BNP: Invalid input(s): POCBNP CBG: No results for input(s): GLUCAP in the last 168 hours.  Time coordinating discharge: 50 minutes  Signed:  Homer CHRISTELLA Cover NP   [facility] 12/03/2023, 3:00 PM

## 2023-12-03 NOTE — Progress Notes (Signed)
 Pt discharged alert and oriented. AVS printed and patient educated on material in AVS.Patient verbalized understanding all education. IV removed. Patient discharged via wheelchair to discharge lounge with all belongings.

## 2023-12-03 NOTE — Discharge Summary (Addendum)
 Erroneous encounter

## 2023-12-26 ENCOUNTER — Other Ambulatory Visit: Payer: Self-pay

## 2023-12-26 ENCOUNTER — Inpatient Hospital Stay (HOSPITAL_COMMUNITY)
Admission: EM | Admit: 2023-12-26 | Discharge: 2023-12-31 | DRG: 812 | Disposition: A | Discharge status: Home / Self Care | Attending: Internal Medicine | Admitting: Internal Medicine

## 2023-12-26 ENCOUNTER — Encounter (HOSPITAL_COMMUNITY): Payer: Self-pay | Admitting: Internal Medicine

## 2023-12-26 DIAGNOSIS — Z8249 Family history of ischemic heart disease and other diseases of the circulatory system: Secondary | ICD-10-CM | POA: Diagnosis not present

## 2023-12-26 DIAGNOSIS — Z6841 Body Mass Index (BMI) 40.0 and over, adult: Secondary | ICD-10-CM

## 2023-12-26 DIAGNOSIS — I358 Other nonrheumatic aortic valve disorders: Secondary | ICD-10-CM | POA: Diagnosis present

## 2023-12-26 DIAGNOSIS — K5903 Drug induced constipation: Secondary | ICD-10-CM | POA: Diagnosis present

## 2023-12-26 DIAGNOSIS — Z832 Family history of diseases of the blood and blood-forming organs and certain disorders involving the immune mechanism: Secondary | ICD-10-CM | POA: Diagnosis not present

## 2023-12-26 DIAGNOSIS — R030 Elevated blood-pressure reading, without diagnosis of hypertension: Secondary | ICD-10-CM | POA: Diagnosis present

## 2023-12-26 DIAGNOSIS — D5700 Hb-SS disease with crisis, unspecified: Principal | ICD-10-CM | POA: Diagnosis present

## 2023-12-26 DIAGNOSIS — G894 Chronic pain syndrome: Secondary | ICD-10-CM | POA: Diagnosis present

## 2023-12-26 DIAGNOSIS — I272 Pulmonary hypertension, unspecified: Secondary | ICD-10-CM | POA: Diagnosis present

## 2023-12-26 DIAGNOSIS — D638 Anemia in other chronic diseases classified elsewhere: Secondary | ICD-10-CM | POA: Diagnosis present

## 2023-12-26 DIAGNOSIS — Z1152 Encounter for screening for COVID-19: Secondary | ICD-10-CM | POA: Diagnosis not present

## 2023-12-26 LAB — CBC WITH DIFFERENTIAL/PLATELET
Abs Immature Granulocytes: 0.02 K/uL (ref 0.00–0.07)
Basophils Absolute: 0 K/uL (ref 0.0–0.1)
Basophils Relative: 0 %
Eosinophils Absolute: 0.2 K/uL (ref 0.0–0.5)
Eosinophils Relative: 2 %
HCT: 33.6 % — ABNORMAL LOW (ref 36.0–46.0)
Hemoglobin: 11.2 g/dL — ABNORMAL LOW (ref 12.0–15.0)
Immature Granulocytes: 0 %
Lymphocytes Relative: 34 %
Lymphs Abs: 2.7 K/uL (ref 0.7–4.0)
MCH: 23.6 pg — ABNORMAL LOW (ref 26.0–34.0)
MCHC: 33.3 g/dL (ref 30.0–36.0)
MCV: 70.7 fL — ABNORMAL LOW (ref 80.0–100.0)
Monocytes Absolute: 0.6 K/uL (ref 0.1–1.0)
Monocytes Relative: 7 %
Neutro Abs: 4.5 K/uL (ref 1.7–7.7)
Neutrophils Relative %: 57 %
Platelets: 283 K/uL (ref 150–400)
RBC: 4.75 MIL/uL (ref 3.87–5.11)
RDW: 16.3 % — ABNORMAL HIGH (ref 11.5–15.5)
WBC: 8 K/uL (ref 4.0–10.5)
nRBC: 0.3 % — ABNORMAL HIGH (ref 0.0–0.2)

## 2023-12-26 LAB — COMPREHENSIVE METABOLIC PANEL WITH GFR
ALT: 11 U/L (ref 0–44)
AST: 23 U/L (ref 15–41)
Albumin: 3.9 g/dL (ref 3.5–5.0)
Alkaline Phosphatase: 85 U/L (ref 38–126)
Anion gap: 9 (ref 5–15)
BUN: <5 mg/dL — ABNORMAL LOW (ref 6–20)
CO2: 24 mmol/L (ref 22–32)
Calcium: 9.4 mg/dL (ref 8.9–10.3)
Chloride: 107 mmol/L (ref 98–111)
Creatinine, Ser: 0.58 mg/dL (ref 0.44–1.00)
GFR, Estimated: >60 mL/min (ref >60)
Glucose, Bld: 86 mg/dL (ref 70–99)
Potassium: 4.1 mmol/L (ref 3.5–5.1)
Sodium: 139 mmol/L (ref 135–145)
Total Bilirubin: 0.8 mg/dL (ref 0.0–1.2)
Total Protein: 7.7 g/dL (ref 6.5–8.1)

## 2023-12-26 LAB — RETICULOCYTES
Immature Retic Fract: 38.1 % — ABNORMAL HIGH (ref 2.3–15.9)
RBC.: 4.67 MIL/uL (ref 3.87–5.11)
Retic Count, Absolute: 163.5 K/uL (ref 19.0–186.0)
Retic Ct Pct: 3.5 % — ABNORMAL HIGH (ref 0.4–3.1)

## 2023-12-26 LAB — HCG, SERUM, QUALITATIVE: Preg, Serum: NEGATIVE

## 2023-12-26 MED ORDER — SENNOSIDES-DOCUSATE SODIUM 8.6-50 MG PO TABS
1.0000 | ORAL_TABLET | Freq: Two times a day (BID) | ORAL | Status: DC
  Filled 2023-12-26 (×7): qty 1

## 2023-12-26 MED ORDER — HYDROMORPHONE HCL 1 MG/ML IJ SOLN
1.0000 mg | Freq: Once | INTRAMUSCULAR | Status: AC
  Administered 2023-12-26: 1 mg via INTRAVENOUS
  Filled 2023-12-26: qty 1

## 2023-12-26 MED ORDER — METHADONE HCL 10 MG PO TABS
20.0000 mg | ORAL_TABLET | Freq: Every day | ORAL | Status: DC
  Administered 2023-12-27 – 2023-12-30 (×4): 20 mg via ORAL
  Filled 2023-12-26 (×4): qty 2

## 2023-12-26 MED ORDER — DEXTROSE-SODIUM CHLORIDE 5-0.45 % IV SOLN: INTRAVENOUS | Status: AC

## 2023-12-26 MED ORDER — HYDROMORPHONE 1 MG/ML IV SOLN
INTRAVENOUS | Status: DC
  Administered 2023-12-26: 30 mg via INTRAVENOUS
  Filled 2023-12-26: qty 30

## 2023-12-26 MED ORDER — METHADONE HCL 10 MG PO TABS
10.0000 mg | ORAL_TABLET | Freq: Two times a day (BID) | ORAL | Status: DC
  Administered 2023-12-26 – 2023-12-30 (×9): 10 mg via ORAL
  Filled 2023-12-26 (×10): qty 1

## 2023-12-26 MED ORDER — HYDROMORPHONE HCL 1 MG/ML IJ SOLN
2.0000 mg | INTRAMUSCULAR | Status: AC
  Administered 2023-12-26: 2 mg via INTRAVENOUS
  Filled 2023-12-26: qty 2

## 2023-12-26 MED ORDER — HYDROMORPHONE HCL 1 MG/ML IJ SOLN: 2.0000 mg | INTRAMUSCULAR | Status: AC

## 2023-12-26 MED ORDER — KETOROLAC TROMETHAMINE 15 MG/ML IJ SOLN
15.0000 mg | Freq: Four times a day (QID) | INTRAMUSCULAR | Status: DC
  Administered 2023-12-26 – 2023-12-31 (×18): 15 mg via INTRAVENOUS
  Filled 2023-12-26 (×18): qty 1

## 2023-12-26 MED ORDER — LACTATED RINGERS IV BOLUS
500.0000 mL | Freq: Once | INTRAVENOUS | Status: AC
  Administered 2023-12-26: 500 mL via INTRAVENOUS

## 2023-12-26 MED ORDER — ONDANSETRON HCL 4 MG/2ML IJ SOLN
4.0000 mg | Freq: Once | INTRAMUSCULAR | Status: AC
  Administered 2023-12-26: 4 mg via INTRAVENOUS
  Filled 2023-12-26: qty 2

## 2023-12-26 MED ORDER — ONDANSETRON HCL 4 MG/2ML IJ SOLN
4.0000 mg | INTRAMUSCULAR | Status: DC | PRN
Start: 2023-12-26 — End: 2023-12-31

## 2023-12-26 MED ORDER — POLYETHYLENE GLYCOL 3350 17 G PO PACK: 17.0000 g | PACK | Freq: Every day | ORAL | Status: DC | PRN

## 2023-12-26 MED ORDER — KETOROLAC TROMETHAMINE 15 MG/ML IJ SOLN
15.0000 mg | INTRAMUSCULAR | Status: AC
  Administered 2023-12-26: 15 mg via INTRAVENOUS
  Filled 2023-12-26: qty 1

## 2023-12-26 MED ORDER — HYDROXYZINE HCL 25 MG PO TABS: 25.0000 mg | ORAL_TABLET | ORAL | Status: DC | PRN

## 2023-12-26 MED ORDER — SODIUM CHLORIDE 0.9 % IV SOLN
12.5000 mg | Freq: Once | INTRAVENOUS | Status: AC
  Administered 2023-12-26: 12.5 mg via INTRAVENOUS
  Filled 2023-12-26: qty 0.25
  Filled 2023-12-26: qty 12.5

## 2023-12-26 MED ORDER — SODIUM CHLORIDE 0.9% FLUSH
9.0000 mL | INTRAVENOUS | Status: DC | PRN
Start: 2023-12-26 — End: 2023-12-31

## 2023-12-26 MED ORDER — ENOXAPARIN SODIUM 40 MG/0.4ML IJ SOSY
40.0000 mg | PREFILLED_SYRINGE | INTRAMUSCULAR | Status: DC
  Administered 2023-12-27 – 2023-12-30 (×4): 40 mg via SUBCUTANEOUS
  Filled 2023-12-26 (×4): qty 0.4

## 2023-12-26 MED ORDER — NALOXONE HCL 0.4 MG/ML IJ SOLN: 0.4000 mg | INTRAMUSCULAR | Status: DC | PRN

## 2023-12-26 MED ORDER — DIPHENHYDRAMINE HCL 25 MG PO CAPS: 25.0000 mg | ORAL_CAPSULE | ORAL | Status: DC | PRN

## 2023-12-26 MED ORDER — HYDROMORPHONE 1 MG/ML IV SOLN
INTRAVENOUS | Status: DC
  Administered 2023-12-27: 20.5 mg via INTRAVENOUS
  Administered 2023-12-27: 30 mg via INTRAVENOUS
  Administered 2023-12-27: 8 mg via INTRAVENOUS
  Administered 2023-12-27: 2.5 mg via INTRAVENOUS
  Administered 2023-12-27: 12 mg via INTRAVENOUS
  Administered 2023-12-27: 9.5 mg via INTRAVENOUS
  Administered 2023-12-27: 30 mg via INTRAVENOUS
  Administered 2023-12-27: 15.5 mg via INTRAVENOUS
  Administered 2023-12-28: 11 mg via INTRAVENOUS
  Administered 2023-12-28 (×2): 30 mg via INTRAVENOUS
  Administered 2023-12-28: 13 mg via INTRAVENOUS
  Administered 2023-12-28: 6.5 mg via INTRAVENOUS
  Administered 2023-12-28: 5.5 mg via INTRAVENOUS
  Administered 2023-12-28: 6.5 mg via INTRAVENOUS
  Administered 2023-12-28: 8.5 mg via INTRAVENOUS
  Administered 2023-12-29: 9 mg via INTRAVENOUS
  Administered 2023-12-29: 30 mg via INTRAVENOUS
  Administered 2023-12-29: 11.3 mg via INTRAVENOUS
  Administered 2023-12-29: 4 mg via INTRAVENOUS
  Administered 2023-12-30: 6 mg via INTRAVENOUS
  Administered 2023-12-30: 14 mg via INTRAVENOUS
  Administered 2023-12-30: 9 mg via INTRAVENOUS
  Administered 2023-12-30: 5 mg via INTRAVENOUS
  Administered 2023-12-30: 9.5 mg via INTRAVENOUS
  Administered 2023-12-30: 9 mg via INTRAVENOUS
  Administered 2023-12-30 – 2023-12-31 (×2): 30 mg via INTRAVENOUS
  Filled 2023-12-26 (×8): qty 30

## 2023-12-26 NOTE — Assessment & Plan Note (Signed)
 Make sure on bowel regimen

## 2023-12-26 NOTE — Subjective & Objective (Signed)
 Patient presents with sickle cell pain started yesterday in her legs and arms reports pain 10 out of 10 similar to her prior sickle cell pain crisis has been ongoing for the past few days she tried to take her normal pain medicines with no help.  No cough no URI no fevers no chills no chest pain or shortness of breath no leg swelling patient does follow with sickle cell pain clinic here she is on methadone  and oxycodone 

## 2023-12-26 NOTE — Assessment & Plan Note (Signed)
 Obtain anemia panel  Transfuse for Hg <7 , rapidly dropping or  if symptomatic

## 2023-12-26 NOTE — ED Notes (Signed)
 IV Team unsuccessful at gaining IV access. Attempted IV access multiple times, MD notified

## 2023-12-26 NOTE — Discharge Instructions (Signed)
 It was our pleasure to provide your ER care today - we hope that you feel better.  Drink plenty of fluids/stay well hydrated. Take your pain medication as need - no driving for the next 8 hours, or if/when taking opiate-type of pain medication.    Follow up closely with primary care doctor in the coming week. Your blood is high, also follow up closely with your doctor for recheck of blood pressure in the coming week.   Return to ER if worse, new symptoms, fevers, trouble breathing, intractable pain, persistent vomiting, chest pain, fainting, or other concern.

## 2023-12-26 NOTE — ED Provider Notes (Signed)
 [place] EMERGENCY DEPARTMENT AT [facility] Provider Note   CSN: [id number] Arrival date & time: 12/26/23  1337     Patient presents with: No chief complaint on file.   Stephanie Burch is a 43 y.o. female.   Pt with hx sickle cell disease, presents c/o bilateral leg and arm pain c/w her prior sickle cell pain in past 1-2 days. States she did take her normal pain meds today. Denies cough or uri symptoms. No fever or chills. No chest pain or discomfort. No neck, back or radicular pain. No extremity swelling or redness. No numbness/weakness. No trauma/fall. Pt indicates is from [facility] but goes to the sickle cell clinic here.   The history is provided by the patient and medical records.       Prior to Admission medications   Medication Sig Start Date End Date Taking? Authorizing Provider  acetaminophen  (TYLENOL ) 500 MG tablet Take 1,000 mg by mouth every 6 (six) hours as needed for headache (pain).    [provider]  ibuprofen  (ADVIL ) 200 MG tablet Take 800 mg by mouth every 6 (six) hours as needed for headache (pain).    [provider]  methadone  (DOLOPHINE ) 10 MG tablet Take 10-20 mg by mouth See admin instructions. Take 2 tablets (20mg ) by mouth in the morning, take 1 tablet (10mg ) around mid-day and take 1 tablet at bedtime. 03/26/23   [provider]  naloxone  (NARCAN ) nasal spray 4 mg/0.1 mL Use as needed for opiod overdose Patient not taking: Reported on 11/28/2023 03/04/22   Paseda, Folashade R, FNP  oxycodone  (ROXICODONE ) 30 MG immediate release tablet Take 30 mg by mouth every 4 (four) hours as needed (severe pain, breakthrough pain).    [provider]  promethazine  (PHENERGAN ) 25 MG tablet Take 25 mg by mouth every 8 (eight) hours as needed for nausea or vomiting.    [provider]  valACYclovir  (VALTREX ) 1000 MG tablet Take 1,000 mg by mouth daily as needed (flares). 01/15/23   [provider]     Allergies: Patient has no known allergies.    Review of Systems  Constitutional:  Negative for chills, diaphoresis and fever.  HENT:  Negative for sore throat.   Respiratory:  Negative for cough and shortness of breath.   Cardiovascular:  Negative for chest pain and leg swelling.  Gastrointestinal:  Negative for abdominal pain, diarrhea, nausea and vomiting.  Genitourinary:  Negative for dysuria and flank pain.  Musculoskeletal:  Negative for back pain and neck pain.  Skin:  Negative for rash.  Neurological:  Negative for weakness, numbness and headaches.    Updated Vital Signs BP (!) 153/108 (BP Location: Left Arm)   Pulse (!) 103   Temp 97.9 F (36.6 C) (Oral)   Resp 18   LMP 11/25/2023   SpO2 99%   Physical Exam Vitals and nursing note reviewed.  Constitutional:      Appearance: Normal appearance. She is well-developed.  HENT:     Head: Atraumatic.     Nose: Nose normal.     Mouth/Throat:     Mouth: Mucous membranes are moist.  Eyes:     General: No scleral icterus.    Conjunctiva/sclera: Conjunctivae normal.  Neck:     Trachea: No tracheal deviation.  Cardiovascular:     Rate and Rhythm: Normal rate and regular rhythm.     Pulses: Normal pulses.     Heart sounds: Normal heart sounds. No murmur heard.  No friction rub. No gallop.  Pulmonary:     Effort: Pulmonary effort is normal. No respiratory distress.     Breath sounds: Normal breath sounds.  Abdominal:     General: There is no distension.     Palpations: Abdomen is soft.     Tenderness: There is no abdominal tenderness.  Genitourinary:    Comments: No cva tenderness.  Musculoskeletal:        General: No swelling or tenderness.     Cervical back: Normal range of motion and neck supple. No rigidity or tenderness. No muscular tenderness.     Right lower leg: No edema.     Left lower leg: No edema.     Comments: CTLS spine, non tender, aligned, no step off. No focal extremity pain w rom, sts, or  bony tenderness. Bil extremities are of normal color and warmth, with intact distal pulses.   Skin:    General: Skin is warm and dry.     Findings: No rash.  Neurological:     Mental Status: She is alert.     Comments: Alert, speech normal. Motor/sens grossly intact bil.   Psychiatric:        Mood and Affect: Mood normal.     (all labs ordered are listed, but only abnormal results are displayed) Results for orders placed or performed during the hospital encounter of 11/28/23  CBC WITH DIFFERENTIAL   Collection Time: 11/28/23  9:41 AM  Result Value Ref Range   WBC 6.4 4.0 - 10.5 K/uL   RBC 5.05 3.87 - 5.11 MIL/uL   Hemoglobin 11.4 (L) 12.0 - 15.0 g/dL   HCT 64.4 (L) 63.9 - 53.9 %   MCV 70.3 (L) 80.0 - 100.0 fL   MCH 22.6 (L) 26.0 - 34.0 pg   MCHC 32.1 30.0 - 36.0 g/dL   RDW 84.7 88.4 - 84.4 %   Platelets 231 150 - 400 K/uL   nRBC 0.0 0.0 - 0.2 %   Neutrophils Relative % 65 %   Neutro Abs 4.2 1.7 - 7.7 K/uL   Lymphocytes Relative 24 %   Lymphs Abs 1.5 0.7 - 4.0 K/uL   Monocytes Relative 8 %   Monocytes Absolute 0.5 0.1 - 1.0 K/uL   Eosinophils Relative 3 %   Eosinophils Absolute 0.2 0.0 - 0.5 K/uL   Basophils Relative 0 %   Basophils Absolute 0.0 0.0 - 0.1 K/uL   Immature Granulocytes 0 %   Abs Immature Granulocytes 0.01 0.00 - 0.07 K/uL  Reticulocytes   Collection Time: 11/28/23  9:41 AM  Result Value Ref Range   Retic Ct Pct 2.6 0.4 - 3.1 %   RBC. 5.04 3.87 - 5.11 MIL/uL   Retic Count, Absolute 133.1 19.0 - 186.0 K/uL   Immature Retic Fract 25.3 (H) 2.3 - 15.9 %  Comprehensive metabolic panel with GFR   Collection Time: 11/28/23  2:50 PM  Result Value Ref Range   Sodium 141 135 - 145 mmol/L   Potassium 4.2 3.5 - 5.1 mmol/L   Chloride 105 98 - 111 mmol/L   CO2 25 22 - 32 mmol/L   Glucose, Bld 86 70 - 99 mg/dL   BUN <5 (L) 6 - 20 mg/dL   Creatinine, Ser 9.31 0.44 - 1.00 mg/dL   Calcium 9.8 8.9 - 89.6 mg/dL   Total Protein 8.1 6.5 - 8.1 g/dL   Albumin 4.1 3.5 -  5.0 g/dL   AST 30 15 - 41 U/L   ALT  13 0 - 44 U/L   Alkaline Phosphatase 83 38 - 126 U/L   Total Bilirubin 0.8 0.0 - 1.2 mg/dL   GFR, Estimated >39 >39 mL/min   Anion gap 10 5 - 15  CBC with Differential/Platelet   Collection Time: 11/29/23  7:47 AM  Result Value Ref Range   WBC 6.4 4.0 - 10.5 K/uL   RBC 4.68 3.87 - 5.11 MIL/uL   Hemoglobin 11.1 (L) 12.0 - 15.0 g/dL   HCT 67.0 (L) 63.9 - 53.9 %   MCV 70.3 (L) 80.0 - 100.0 fL   MCH 23.7 (L) 26.0 - 34.0 pg   MCHC 33.7 30.0 - 36.0 g/dL   RDW 84.7 88.4 - 84.4 %   Platelets 212 150 - 400 K/uL   nRBC 0.3 (H) 0.0 - 0.2 %   Neutrophils Relative % 47 %   Neutro Abs 3.0 1.7 - 7.7 K/uL   Lymphocytes Relative 38 %   Lymphs Abs 2.5 0.7 - 4.0 K/uL   Monocytes Relative 11 %   Monocytes Absolute 0.7 0.1 - 1.0 K/uL   Eosinophils Relative 3 %   Eosinophils Absolute 0.2 0.0 - 0.5 K/uL   Basophils Relative 1 %   Basophils Absolute 0.0 0.0 - 0.1 K/uL   Immature Granulocytes 0 %   Abs Immature Granulocytes 0.01 0.00 - 0.07 K/uL  Comprehensive metabolic panel   Collection Time: 11/29/23  7:47 AM  Result Value Ref Range   Sodium 141 135 - 145 mmol/L   Potassium 4.4 3.5 - 5.1 mmol/L   Chloride 106 98 - 111 mmol/L   CO2 28 22 - 32 mmol/L   Glucose, Bld 109 (H) 70 - 99 mg/dL   BUN 7 6 - 20 mg/dL   Creatinine, Ser 9.19 0.44 - 1.00 mg/dL   Calcium 9.4 8.9 - 89.6 mg/dL   Total Protein 7.6 6.5 - 8.1 g/dL   Albumin 4.0 3.5 - 5.0 g/dL   AST 16 15 - 41 U/L   ALT 9 0 - 44 U/L   Alkaline Phosphatase 77 38 - 126 U/L   Total Bilirubin 0.8 0.0 - 1.2 mg/dL   GFR, Estimated >39 >39 mL/min   Anion gap 8 5 - 15  CBC   Collection Time: 12/02/23 10:37 AM  Result Value Ref Range   WBC 10.2 4.0 - 10.5 K/uL   RBC 4.88 3.87 - 5.11 MIL/uL   Hemoglobin 11.0 (L) 12.0 - 15.0 g/dL   HCT 63.6 63.9 - 53.9 %   MCV 74.4 (L) 80.0 - 100.0 fL   MCH 22.5 (L) 26.0 - 34.0 pg   MCHC 30.3 30.0 - 36.0 g/dL   RDW 84.8 88.4 - 84.4 %   Platelets 242 150 - 400 K/uL   nRBC  1.0 (H) 0.0 - 0.2 %  Basic metabolic panel with GFR   Collection Time: 12/02/23 10:37 AM  Result Value Ref Range   Sodium 140 135 - 145 mmol/L   Potassium 4.1 3.5 - 5.1 mmol/L   Chloride 108 98 - 111 mmol/L   CO2 22 22 - 32 mmol/L   Glucose, Bld 87 70 - 99 mg/dL   BUN 8 6 - 20 mg/dL   Creatinine, Ser 9.33 0.44 - 1.00 mg/dL   Calcium 9.0 8.9 - 89.6 mg/dL   GFR, Estimated >39 >39 mL/min   Anion gap 10 5 - 15     EKG: None  Radiology: No results found.   Procedures   Medications Ordered in the  ED  HYDROmorphone  (DILAUDID ) injection 1 mg (has no administration in time range)  ondansetron  (ZOFRAN ) injection 4 mg (has no administration in time range)  lactated ringers  bolus 500 mL (has no administration in time range)                                    Medical Decision Making Problems Addressed: Elevated blood pressure reading: acute illness or injury Sickle-cell disease with pain (HCC): acute illness or injury with systemic symptoms that poses a threat to life or bodily functions  Amount and/or Complexity of Data Reviewed External Data Reviewed: labs and notes. Labs: ordered. Decision-making details documented in ED Course.  Risk Prescription drug management. Parenteral controlled substances.   Iv ns. Continuous pulse ox and cardiac monitoring. Labs ordered/sent.  Differential diagnosis includes  . Dispo decision including potential need for admission considered - will get labs and reassess.   Reviewed nursing notes and prior charts for additional history. External reports reviewed.  Cardiac monitor: sinus rhythm, rate 100.   Dilaudid  1 mg iv. Zofran  iv. 500 cc iv bolus.   Labs reviewed/interpreted by me - pnd.  2345, labs pending - signed out to oncoming EDP, Dr Zackowski, to check pending labs, recheck pt, and dispo appropriately.          Final diagnoses:  Sickle-cell disease with pain (HCC)  Elevated blood pressure reading    ED Discharge  Orders     None          Bernard Drivers, MD 12/26/23 1552

## 2023-12-26 NOTE — Assessment & Plan Note (Signed)
-   will admit per sickle cell protocol,    control pain,    hydrate with IVF D5 .45% Saline @ 100 mls/hour,    Weight based Dilaudid PCA for opioid tolerant patients.      Transfuse as needed if Hg drops significantly below baseline.    No evidence of acute chest at this time   Sickle cell team to take over management in AM

## 2023-12-26 NOTE — Assessment & Plan Note (Signed)
 Contributing to comorbidity and complicating medical management  There is no height or weight on file to calculate BMI.  Nutritional follow up as an out pt would be recommended

## 2023-12-26 NOTE — ED Provider Notes (Addendum)
 Patient receiving the sickle cell protocol.  Complete metabolic panel normal.  CBC [place] count 8 hemoglobin 11.2 platelets 283.  Reticular cell count 3.5.  Serum pregnancy test negative.  Will reassess after she is completed the protocol.   Peggi Yono, MD 12/26/23 [phone]  Patient has completed the protocol pain for the sickle cell.  And says she is no better.  Labs are quite reassuring.  Will contact hospitalist for admission.   Termaine Roupp, MD 12/26/23 2109

## 2023-12-26 NOTE — ED Triage Notes (Signed)
 Patient complains of Sickle Cell Crisis. Pain started yesterday. Pain in legs and arms. Rates pain 10/10. Denies any other symptoms.

## 2023-12-26 NOTE — Assessment & Plan Note (Signed)
 Continue home dose of methadone

## 2023-12-26 NOTE — Assessment & Plan Note (Signed)
Provide oxygen as needed ?

## 2023-12-26 NOTE — H&P (Signed)
 Stephanie Burch:[id number] DOB: [birth year] DOA: 12/26/2023     PCP: Pcp, No   Outpatient Specialists: Followed at [facility]  Patient arrived to ER on 12/26/23 at 1337 Referred by Attending Zackowski, Scott, MD   Patient coming from:    home Lives   With family    Chief Complaint: Legs and arm pain   HPI: Stephanie Burch is a 43 y.o. female with medical history significant of sickle cell disease, chronic pain syndrome anemia of chronic disease depression, pulmonary hypertension, morbid obesity    Presented with    Patient presents with sickle cell pain started yesterday in her legs and arms reports pain 10 out of 10 similar to her prior sickle cell pain crisis has been ongoing for the past few days she tried to take her normal pain medicines with no help.  No cough no URI no fevers no chills no chest pain or shortness of breath no leg swelling patient does follow with sickle cell pain clinic here she is on methadone  and oxycodone     Patient last time admitted in October at that time pain treated with Dilaudid  PCA and Toradol   Denies significant ETOH intake   Does not smoke     Regarding pertinent Chronic problems:     last echo  Recent Results (from the past 56199 hours)  ECHOCARDIOGRAM COMPLETE   Collection Time: 11/22/21  2:46 PM  Result Value   BP 134/62   S' Lateral 3.10   AR max vel 3.96   AV Area VTI 4.32   AV Mean grad 5.0   AV Peak grad 9.6   Ao pk vel 1.55   Area-P 1/2 4.06   AV Area mean vel 3.99   Narrative      ECHOCARDIOGRAM REPORT         1. Left ventricular ejection fraction, by estimation, is 60 to 65%. The left ventricle has normal function. The left ventricle has no regional wall motion abnormalities. Left ventricular diastolic parameters are indeterminate.  2. Right ventricular systolic function is normal. The right ventricular size is normal.  3. The mitral valve is normal in structure. Trivial mitral valve regurgitation.  4. The aortic  valve is tricuspid. Aortic valve regurgitation is not visualized. Aortic valve sclerosis/calcification is present, without any evidence of aortic stenosis.  FINDINGS  Left Ventricle: Left ventricular ejection fraction, by estimation, is 60 to 65%. The left ventricle has normal function. The left ventricle has no regional wall motion abnormalities. The left ventricular internal cavity size was normal in size. There is  no left ventricular hypertrophy. Left ventricular diastolic parameters are indeterminate.  Right Ventricle: The right ventricular size is normal. No increase in right ventricular wall thickness. Right ventricular systolic function is normal.  Left Atrium: Left atrial size was normal in size.  Right Atrium: Right atrial size was normal in size.  Pericardium: There is no evidence of pericardial effusion.  Mitral Valve: The mitral valve is normal in structure. Trivial mitral valve regurgitation.  Tricuspid Valve: The tricuspid valve is normal in structure. Tricuspid valve regurgitation is trivial.  Aortic Valve: The aortic valve is tricuspid. Aortic valve regurgitation is not visualized. Aortic valve sclerosis/calcification is present, without any evidence of aortic stenosis. Aortic valve mean gradient measures 5.0 mmHg. Aortic valve peak gradient  measures 9.6 mmHg. Aortic valve area, by VTI measures 4.32 cm.  Pulmonic Valve: The pulmonic valve was not well visualized. Pulmonic valve regurgitation is not visualized. No evidence of pulmonic  stenosis.  Aorta: The aortic root is normal in size and structure.  IAS/Shunts: No atrial level shunt detected by color flow Doppler.       *Note: Due to a large number of results and/or encounters for the requested time period, some results have not been displayed. A complete set of results can be found in Results Review.       Morbid obesity-   BMI Readings from Last 1 Encounters:  11/28/23 52.42 kg/m    While in ER:      Continues to report pain despite multiple doses of Dilaudid     Lab Orders         Comprehensive metabolic panel         CBC with Differential         Reticulocytes         hCG, serum, qualitative       Following Medications were ordered in ER: Medications  HYDROmorphone  (DILAUDID ) injection 2 mg (2 mg Intravenous Not Given 12/26/23 1939)  ondansetron  (ZOFRAN ) injection 4 mg (has no administration in time range)  HYDROmorphone  (DILAUDID ) injection 1 mg (1 mg Intravenous Given 12/26/23 1732)  ondansetron  (ZOFRAN ) injection 4 mg (4 mg Intravenous Given 12/26/23 1729)  lactated ringers  bolus 500 mL (500 mLs Intravenous New Bag/Given 12/26/23 1735)  HYDROmorphone  (DILAUDID ) injection 1 mg (1 mg Intravenous Given 12/26/23 1804)  ketorolac  (TORADOL ) 15 MG/ML injection 15 mg (15 mg Intravenous Given 12/26/23 1804)  HYDROmorphone  (DILAUDID ) injection 2 mg (2 mg Intravenous Given 12/26/23 1900)  HYDROmorphone  (DILAUDID ) injection 2 mg (2 mg Intravenous Given 12/26/23 2014)  diphenhydrAMINE  (BENADRYL ) 12.5 mg in sodium chloride  0.9 % 50 mL IVPB (0 mg Intravenous Stopped 12/26/23 2014)     ED Triage Vitals  Encounter Vitals Group     BP 12/26/23 1340 (!) 153/108     Girls Systolic BP Percentile --      Girls Diastolic BP Percentile --      Boys Systolic BP Percentile --      Boys Diastolic BP Percentile --      Pulse Rate 12/26/23 1340 (!) 103     Resp 12/26/23 1340 18     Temp 12/26/23 1340 97.9 F (36.6 C)     Temp Source 12/26/23 1340 Oral     SpO2 12/26/23 1340 99 %     Weight --      Height --      Head Circumference --      Peak Flow --      Pain Score 12/26/23 1400 10     Pain Loc --      Pain Education --      Exclude from Growth Chart --   UFJK(75)@     _________________________________________ Significant initial  Findings: Abnormal Labs Reviewed  COMPREHENSIVE METABOLIC PANEL WITH GFR - Abnormal; Notable for the following components:      Result Value   BUN <5 (*)     All other components within normal limits  CBC WITH DIFFERENTIAL/PLATELET - Abnormal; Notable for the following components:   Hemoglobin 11.2 (*)    HCT 33.6 (*)    MCV 70.7 (*)    MCH 23.6 (*)    RDW 16.3 (*)    nRBC 0.3 (*)    All other components within normal limits  RETICULOCYTES - Abnormal; Notable for the following components:   Retic Ct Pct 3.5 (*)    Immature Retic Fract 38.1 (*)    All other components within normal  limits      ECG: Ordered   The recent clinical data is shown below. Vitals:   12/26/23 1340 12/26/23 1727 12/26/23 2009  BP: (!) 153/108 (!) 140/98 120/77  Pulse: (!) 103 84 81  Resp: 18 18 19   Temp: 97.9 F (36.6 C) 98 F (36.7 C) 98 F (36.7 C)  TempSrc: Oral Oral Oral  SpO2: 99% 100% 100%    WBC     Component Value Date/Time   WBC 8.0 12/26/2023 1446   LYMPHSABS 2.7 12/26/2023 1446   LYMPHSABS 2.2 09/11/2017 1431   MONOABS 0.6 12/26/2023 1446   EOSABS 0.2 12/26/2023 1446   EOSABS 0.1 09/11/2017 1431   BASOSABS 0.0 12/26/2023 1446   BASOSABS 0.0 09/11/2017 1431        __________________________________________________________ Recent Labs  Lab 12/26/23 1446  NA 139  K 4.1  CO2 24  GLUCOSE 86  BUN <5*  CREATININE 0.58  CALCIUM 9.4    Cr    stable,    Lab Results  Component Value Date   CREATININE 0.58 12/26/2023   CREATININE 0.66 12/02/2023   CREATININE 0.80 11/29/2023    Recent Labs  Lab 12/26/23 1446  AST 23  ALT 11  ALKPHOS 85  BILITOT 0.8  PROT 7.7  ALBUMIN 3.9   Lab Results  Component Value Date   CALCIUM 9.4 12/26/2023   PHOS 3.6 08/10/2023       Plt: Lab Results  Component Value Date   PLT 283 12/26/2023    Recent Labs  Lab 12/26/23 1446  WBC 8.0  NEUTROABS 4.5  HGB 11.2*  HCT 33.6*  MCV 70.7*  PLT 283    HG/HCT  stable,       Component Value Date/Time   HGB 11.2 (L) 12/26/2023 1446   HGB 13.0 09/11/2017 1431   HCT 33.6 (L) 12/26/2023 1446   HCT 38.4 09/11/2017 1431   MCV 70.7 (L)  12/26/2023 1446   MCV 75 (L) 09/11/2017 1431       _______________________________________________ Hospitalist was called for admission for   Sickle-cell disease with pain  Elevated blood pressure reading     The following Work up has been ordered so far:  Orders Placed This Encounter  Procedures   Comprehensive metabolic panel   CBC with Differential   Reticulocytes   hCG, serum, qualitative   Monitor O2 SATs   Document Actual / Estimated Weight   If O2 sat <94% Administer O2 @ 2 Liters/Minute   Vital signs with O2 sat, q1hour   ED Cardiac monitoring   Monitor O2 SATs   Weigh patient in Kg   Saline Lock IV-Maintain IV access   Patient may eat/drink   Initiate Carrier Fluid Protocol   Consult to hospitalist     OTHER Significant initial  Findings:  labs showing:     DM  labs:  HbA1C: No results for input(s): HGBA1C in the last 8760 hours.     CBG (last 3)  No results for input(s): GLUCAP in the last 72 hours.        Cultures:    Component Value Date/Time   SDES  01/10/2020 1630    URINE, CLEAN CATCH Performed at [facility], [address] [place], [place] [address]    SPECREQUEST  01/10/2020 1630    NONE Performed at [facility], [address], [place], [place] [address]    CULT (A) 01/10/2020 1630    40,000 COLONIES/mL MULTIPLE SPECIES PRESENT, SUGGEST  RECOLLECTION   REPTSTATUS 01/13/2020 FINAL 01/10/2020 1630     Radiological Exams on Admission: No results found. _______________________________________________________________________________________________________ Latest  Blood pressure 120/77, pulse 81, temperature 98 F (36.7 C), temperature source Oral, resp. rate 19, last menstrual period 11/25/2023, SpO2 100%.   Vitals  labs and radiology finding personally reviewed  Review of Systems:    Pertinent positives include: Lower extremity pain and left arm pain   Constitutional:  No weight loss,  night sweats, Fevers, chills, fatigue, weight loss  HEENT:  No headaches, Difficulty swallowing,Tooth/dental problems,Sore throat,  No sneezing, itching, ear ache, nasal congestion, post nasal drip,  Cardio-vascular:  No chest pain, Orthopnea, PND, anasarca, dizziness, palpitations.no Bilateral lower extremity swelling  GI:  No heartburn, indigestion, abdominal pain, nausea, vomiting, diarrhea, change in bowel habits, loss of appetite, melena, blood in stool, hematemesis Resp:  no shortness of breath at rest. No dyspnea on exertion, No excess mucus, no productive cough, No non-productive cough, No coughing up of blood.No change in color of mucus.No wheezing. Skin:  no rash or lesions. No jaundice GU:  no dysuria, change in color of urine, no urgency or frequency. No straining to urinate.  No flank pain.  Musculoskeletal:  No joint pain or no joint swelling. No decreased range of motion. No back pain.  Psych:  No change in mood or affect. No depression or anxiety. No memory loss.  Neuro: no localizing neurological complaints, no tingling, no weakness, no double vision, no gait abnormality, no slurred speech, no confusion  All systems reviewed and apart from HOPI all are negative _______________________________________________________________________________________________ Past Medical History:   Past Medical History:  Diagnosis Date   Acute kidney injury 11/07/2019   Anemia    Chronic pain    COVID-19 virus detected 10/22/2019   Depression    HCAP (healthcare-associated pneumonia) 11/07/2019   History of blood transfusion 2013   x 2   HSV infection    Hypoxia    Pneumonia    x 1   Pulmonary edema 11/11/2019   Sepsis (HCC) 11/07/2019   Sickle cell anemia (HCC) 09/2021   gets them frequently   Tachycardia with heart rate 100-120 beats per minute    Vitamin B12 deficiency 12/2018   Vitamin D  deficiency       Past Surgical History:  Procedure Laterality Date   CESAREAN  SECTION  2013   x 1   TOOTH EXTRACTION N/A 10/18/2021   Procedure: DENTAL RESTORATION/EXTRACTIONS;  Surgeon: Sheryle Hamilton, DMD;  Location: [facility] OR;  Service: Oral Surgery;  Laterality: N/A;    Social History:  Ambulatory   independently or cane      reports that she has never smoked. She has never used smokeless tobacco. She reports that she does not currently use alcohol. She reports that she does not use drugs.   Family History:  Family History  Problem Relation Age of Onset   Hypertension Mother    Sickle cell trait Mother    Diabetes Father    Sickle cell trait Father    ______________________________________________________________________________________________ Allergies: No Known Allergies   Prior to Admission medications   Medication Sig Start Date End Date Taking? Authorizing Provider  acetaminophen  (TYLENOL ) 500 MG tablet Take 1,000 mg by mouth every 6 (six) hours as needed for headache (pain).    [provider]  ibuprofen  (ADVIL ) 200 MG tablet Take 800 mg by mouth every 6 (six) hours as needed for headache (pain).    [provider]  methadone  (DOLOPHINE ) 10  MG tablet Take 10-20 mg by mouth See admin instructions. Take 2 tablets (20mg ) by mouth in the morning, take 1 tablet (10mg ) around mid-day and take 1 tablet at bedtime. 03/26/23   [provider]  naloxone  (NARCAN ) nasal spray 4 mg/0.1 mL Use as needed for opiod overdose Patient not taking: Reported on 11/28/2023 03/04/22   Paseda, Folashade R, FNP  oxycodone  (ROXICODONE ) 30 MG immediate release tablet Take 30 mg by mouth every 4 (four) hours as needed (severe pain, breakthrough pain).    [provider]  promethazine  (PHENERGAN ) 25 MG tablet Take 25 mg by mouth every 8 (eight) hours as needed for nausea or vomiting.    [provider]  valACYclovir  (VALTREX ) 1000 MG tablet Take 1,000 mg by mouth daily as needed (flares). 01/15/23   [provider]     ___________________________________________________________________________________________________ Physical Exam:    12/26/2023    8:09 PM 12/26/2023    5:27 PM 12/26/2023    1:40 PM  Vitals with BMI  Systolic 120 140 846  Diastolic 77 98 108  Pulse 81 84 103     1. General:  in No  Acute distress   Chronically ill   -appearing 2. Psychological: Alert and   Oriented 3. Head/ENT:    Dry Mucous Membranes                          Head Non traumatic, neck supple                      Poor Dentition 4. SKIN: decreased Skin turgor,  Skin clean Dry and intact no rash    5. Heart: Regular rate and rhythm no  Murmur, no Rub or gallop 6. Lungs:  no wheezes or crackles   7. Abdomen: Soft,  non-tender, Non distended   obese  bowel sounds present 8. Lower extremities: no clubbing, cyanosis, no  edema 9. Neurologically Grossly intact, moving all 4 extremities equally   10. MSK: Normal range of motion    Chart has been reviewed  ______________________________________________________________________________________________  Assessment/Plan 43 y.o. female with medical history significant of sickle cell disease, chronic pain syndrome anemia of chronic disease depression, pulmonary hypertension, morbid obesity   Admitted for   Sickle-cell disease with pain    Present on Admission:  Sickle cell pain crisis (HCC)  Anemia of chronic disease  Chronic pain syndrome  Constipation due to pain medication  Pulmonary hypertension (HCC)  Sickle cell disease with crisis (HCC)     Anemia of chronic disease Obtain anemia panel  Transfuse for Hg <7 , rapidly dropping or  if symptomatic   BMI 50.0-59.9, adult (HCC) Contributing to comorbidity and complicating medical management  There is no height or weight on file to calculate BMI.  Nutritional follow up as an out pt would be recommended   Chronic pain syndrome Continue home dose of methadone   Constipation due to pain  medication Make sure on bowel regimen  Pulmonary hypertension (HCC) Provide oxygen  as needed  Sickle cell disease with crisis (HCC) - will admit per sickle cell protocol,    control pain,    hydrate with IVF D5 .45% Saline @ 100 mls/hour,    Weight based Dilaudid  PCA for opioid tolerant patients.     Transfuse as needed if Hg drops significantly below baseline.    No evidence of acute chest at this time   Sickle cell team to take over management in  AM   Other plan as per orders.  DVT prophylaxis:  SCD      Code Status:    Code Status: Prior FULL CODE   as per patient   I had personally discussed CODE STATUS with patient    ACP   none  Family Communication:   Family not at  Bedside    Diet heart healthy   Disposition Plan:    To home once workup is complete and patient is stable   Following barriers for discharge:                                                           Anemia  stable                             Pain controlled with PO medications        Consult Orders  (From admission, onward)           Start     Ordered   12/26/23 2113  Consult to hospitalist  Once       Provider:  (Not yet assigned)  Question Answer Comment  Place call to: [facility] sickle cell crisis [id number]   Reason for Consult Admit      12/26/23 2112            Consults called:    NONE   Admission status:  ED Disposition     ED Disposition  Admit   Condition  --   Comment  Hospital Area: [facility] [[id number]]  Level of Care: Med-Surg [[id number]]  May admit patient to [facility] or [facility] if equivalent level of care is available:: No  Diagnosis: Sickle cell pain crisis [facility]) [[id number]]  Admitting Physician: Yaritza Leist [[id number]]  Attending Physician: Mitchelle Sultan [[id number]]  Certification:: I certify this patient will need inpatient services for at least 2 midnights             inpatient     I Expect 2 midnight stay secondary to  severity of patient's current illness need for inpatient interventions justified by the following:    Severe lab/radiological/exam abnormalities including:    Sickle-cell disease with pain  and extensive comorbidities including:   Morbid Obesity   sickle cell disease   That are currently affecting medical management.   I expect  patient to be hospitalized for 2 midnights requiring inpatient medical care.  Patient is at high risk for adverse outcome (such as loss of life or disability) if not treated.  Indication for inpatient stay as follows:  ,  severe pain requiring acute inpatient management,    Need for , IV fluids,, IV pain medications     Level of care     medical floor        Blease Quiver 12/26/2023, 9:51 PM    [facility]     after 2 AM please page floor coverage   If 7AM-7PM, please contact the day team taking care of the patient using [email or web address]

## 2023-12-27 DIAGNOSIS — D5700 Hb-SS disease with crisis, unspecified: Secondary | ICD-10-CM | POA: Diagnosis not present

## 2023-12-27 LAB — CBC
HCT: 33.2 % — ABNORMAL LOW (ref 36.0–46.0)
Hemoglobin: 11.2 g/dL — ABNORMAL LOW (ref 12.0–15.0)
MCH: 24 pg — ABNORMAL LOW (ref 26.0–34.0)
MCHC: 33.7 g/dL (ref 30.0–36.0)
MCV: 71.2 fL — ABNORMAL LOW (ref 80.0–100.0)
Platelets: 232 K/uL (ref 150–400)
RBC: 4.66 MIL/uL (ref 3.87–5.11)
RDW: 15.9 % — ABNORMAL HIGH (ref 11.5–15.5)
WBC: 7.6 K/uL (ref 4.0–10.5)
nRBC: 0.7 % — ABNORMAL HIGH (ref 0.0–0.2)

## 2023-12-27 LAB — COMPREHENSIVE METABOLIC PANEL WITH GFR
ALT: 8 U/L (ref 0–44)
AST: 17 U/L (ref 15–41)
Albumin: 4 g/dL (ref 3.5–5.0)
Alkaline Phosphatase: 80 U/L (ref 38–126)
Anion gap: 9 (ref 5–15)
BUN: 6 mg/dL (ref 6–20)
CO2: 23 mmol/L (ref 22–32)
Calcium: 9 mg/dL (ref 8.9–10.3)
Chloride: 106 mmol/L (ref 98–111)
Creatinine, Ser: 0.64 mg/dL (ref 0.44–1.00)
GFR, Estimated: >60 mL/min (ref >60)
Glucose, Bld: 114 mg/dL — ABNORMAL HIGH (ref 70–99)
Potassium: 4.1 mmol/L (ref 3.5–5.1)
Sodium: 138 mmol/L (ref 135–145)
Total Bilirubin: 0.7 mg/dL (ref 0.0–1.2)
Total Protein: 7.2 g/dL (ref 6.5–8.1)

## 2023-12-27 LAB — LACTATE DEHYDROGENASE: LDH: 260 U/L — ABNORMAL HIGH (ref 105–235)

## 2023-12-27 LAB — RETICULOCYTES
Immature Retic Fract: 35.7 % — ABNORMAL HIGH (ref 2.3–15.9)
RBC.: 4.65 MIL/uL (ref 3.87–5.11)
Retic Count, Absolute: 153.5 K/uL (ref 19.0–186.0)
Retic Ct Pct: 3.3 % — ABNORMAL HIGH (ref 0.4–3.1)

## 2023-12-27 LAB — PHOSPHORUS: Phosphorus: 3.9 mg/dL (ref 2.5–4.6)

## 2023-12-27 LAB — MAGNESIUM: Magnesium: 2.2 mg/dL (ref 1.7–2.4)

## 2023-12-27 MED ORDER — ACETAMINOPHEN 500 MG PO TABS: 1000.0000 mg | ORAL_TABLET | Freq: Four times a day (QID) | ORAL | Status: DC | PRN

## 2023-12-27 MED ORDER — OXYCODONE HCL 5 MG PO TABS
30.0000 mg | ORAL_TABLET | ORAL | Status: DC | PRN
  Administered 2023-12-27 – 2023-12-31 (×21): 30 mg via ORAL
  Filled 2023-12-27 (×21): qty 6

## 2023-12-27 NOTE — Progress Notes (Signed)
 Patient ID: Stephanie Burch, female   DOB: 05-24-1980, 43 y.o.   MRN: [id number] Subjective: Stephanie Burch is a 43 y.o. female with medical history significant of sickle cell disease, chronic pain syndrome, anemia of chronic disease, history of depression, who presented to the ER complaining of bilateral lower extremity pain rated as 10 out of 10 consistent with her typical sickle cell disease.  Patient was admitted for sickle cell pain crisis.   Patient continues to endorse significant pain especially her lower extremities.  She is rating her pain at 10/10 this morning, achy and throbbing.  There is no redness or swelling of any joint.  She denies any fever, cough, chest pain, shortness of breath, nausea, vomiting or diarrhea.  No urinary symptoms.  Objective:  Vital signs in last 24 hours:  Vitals:   12/27/23 0309 12/27/23 0356 12/27/23 0932 12/27/23 1044  BP:  123/77 121/73   Pulse:  87 89   Resp: 14 18 14 14   Temp:  98.3 F (36.8 C) 98.8 F (37.1 C)   TempSrc:  Oral Oral   SpO2: 93% 96% 92%   Weight:      Height:        Intake/Output from previous day:   Intake/Output Summary (Last 24 hours) at 12/27/2023 1305 Last data filed at 12/27/2023 0113 Gross per 24 hour  Intake 961.77 ml  Output --  Net 961.77 ml    Physical Exam: General: Alert, awake, oriented x3, in no acute distress.  Morbidly obese. HEENT: [place]/AT PEERL, EOMI Neck: Trachea midline,  no masses, no thyromegal,y no JVD, no carotid bruit OROPHARYNX:  Moist, No exudate/ erythema/lesions.  Heart: Regular rate and rhythm, without murmurs, rubs, gallops, PMI non-displaced, no heaves or thrills on palpation.  Lungs: Clear to auscultation, no wheezing or rhonchi noted. No increased vocal fremitus resonant to percussion  Abdomen: Soft, nontender, nondistended, positive bowel sounds, no masses no hepatosplenomegaly noted..  Neuro: No focal neurological deficits noted cranial nerves II through XII grossly intact. DTRs  2+ bilaterally upper and lower extremities. Strength 5 out of 5 in bilateral upper and lower extremities. Musculoskeletal: No warm swelling or erythema around joints, no spinal tenderness noted. Psychiatric: Patient alert and oriented x3, good insight and cognition, good recent to remote recall. Lymph node survey: No cervical axillary or inguinal lymphadenopathy noted.  Lab Results:  Basic Metabolic Panel:    Component Value Date/Time   NA 138 12/27/2023 0713   NA 142 09/11/2017 1431   K 4.1 12/27/2023 0713   CL 106 12/27/2023 0713   CO2 23 12/27/2023 0713   BUN 6 12/27/2023 0713   BUN 8 09/11/2017 1431   CREATININE 0.64 12/27/2023 0713   GLUCOSE 114 (H) 12/27/2023 0713   CALCIUM 9.0 12/27/2023 0713   CBC:    Component Value Date/Time   WBC 7.6 12/27/2023 0713   HGB 11.2 (L) 12/27/2023 0713   HGB 13.0 09/11/2017 1431   HCT 33.2 (L) 12/27/2023 0713   HCT 38.4 09/11/2017 1431   PLT 232 12/27/2023 0713   PLT 258 09/11/2017 1431   MCV 71.2 (L) 12/27/2023 0713   MCV 75 (L) 09/11/2017 1431   NEUTROABS 4.5 12/26/2023 1446   NEUTROABS 3.9 09/11/2017 1431   LYMPHSABS 2.7 12/26/2023 1446   LYMPHSABS 2.2 09/11/2017 1431   MONOABS 0.6 12/26/2023 1446   EOSABS 0.2 12/26/2023 1446   EOSABS 0.1 09/11/2017 1431   BASOSABS 0.0 12/26/2023 1446   BASOSABS 0.0 09/11/2017 1431    No  results found for this or any previous visit (from the past 240 hours).  Studies/Results: No results found.  Medications: Scheduled Meds:  enoxaparin  (LOVENOX ) injection  40 mg Subcutaneous Q24H   HYDROmorphone    Intravenous Q4H   ketorolac   15 mg Intravenous Q6H   methadone   10 mg Oral BID   methadone   20 mg Oral Daily   senna-docusate  1 tablet Oral BID   Continuous Infusions:  dextrose  5 % and 0.45 % NaCl 100 mL/hr at 12/26/23 2328   PRN Meds:.acetaminophen , diphenhydrAMINE , hydrOXYzine , naloxone  **AND** sodium chloride  flush, ondansetron , oxycodone , polyethylene  glycol  Consultants: None  Procedures: None  Antibiotics: None  Assessment/Plan: Active Problems:   BMI 50.0-59.9, adult (HCC)   Chronic pain syndrome   Constipation due to pain medication   Sickle cell pain crisis (HCC)   Pulmonary hypertension (HCC)   Anemia of chronic disease   Sickle cell disease with crisis (HCC)  Hb Sickle Cell Disease with Pain crisis: Reduce IV fluid to KVO, continue weight based Dilaudid  PCA at current dose setting of 0.5/10/3, continue IV Toradol  15 mg Q 6 H for a total of 5 days, restart and continue oral home pain medications as ordered. Monitor vitals very closely, Re-evaluate pain scale regularly, 2 L of Oxygen  by [place]. Patient encouraged to ambulate on the hallway today.  Anemia of Chronic Disease: Hemoglobin is stable at baseline today.  There is no clinical indication for blood transfusion at this time.  We will continue to monitor very closely and transfuse as appropriate. Chronic pain Syndrome: Restart and continue oral home pain medications including methadone . Pulmonary hypertension: This is chronic.  Patient does not require supplemental oxygen  beyond the routine 2 L while on Dilaudid  via PCA.  I encouraged incentive spirometry. Morbid obesity: Patient's BMI is now 56.11 kg/m. Patient was counseled extensively about dietary control and lifestyle modification. Patient not able to exercise due to chronic pain. Patient may need outpatient referral to dietitian/nutritionist/weight loss specialist.   Code Status: Full Code Family Communication: N/A Disposition Plan: Not yet ready for discharge  Stephanie Burch  If 7PM-7AM, please contact night-coverage.  12/27/2023, 1:05 PM  LOS: 1 day

## 2023-12-27 NOTE — Plan of Care (Signed)
  Problem: Education: Goal: Long-term complications will improve Outcome: Progressing   Problem: Bowel/Gastric: Goal: Gut motility will be maintained Outcome: Progressing   Problem: Sensory: Goal: Pain level will decrease with appropriate interventions Outcome: Progressing   Problem: Health Behavior/Discharge Planning: Goal: Ability to manage health-related needs will improve Outcome: Progressing   Problem: Clinical Measurements: Goal: Ability to maintain clinical measurements within normal limits will improve Outcome: Progressing Goal: Will remain free from infection Outcome: Progressing   Problem: Activity: Goal: Risk for activity intolerance will decrease Outcome: Progressing   Problem: Pain Managment: Goal: General experience of comfort will improve and/or be controlled Outcome: Progressing   Problem: Safety: Goal: Ability to remain free from injury will improve Outcome: Progressing   Problem: Skin Integrity: Goal: Risk for impaired skin integrity will decrease Outcome: Progressing

## 2023-12-28 NOTE — Progress Notes (Cosign Needed)
 Patient ID: Stephanie Burch, female   DOB: 1980/06/20, 43 y.o.   MRN: [id number] Subjective: Stephanie Burch is a 43 y.o. female with medical history significant of sickle cell disease, chronic pain syndrome, anemia of chronic disease, history of depression, who presented to the ER complaining of bilateral lower extremity pain rated as 10/10 consistent with her typical sickle cell disease.    Patient  is rating her pain at 9/10 this morning,  She denies any fever, cough, chest pain, shortness of breath, nausea, vomiting or diarrhea.  No urinary symptoms.  Objective:  Vital signs in last 24 hours:  Vitals:   12/29/23 0638 12/29/23 0847 12/29/23 1124 12/29/23 1154  BP: (!) 151/79   (!) 143/101  Pulse: 95   82  Resp: 18 15 16 14   Temp: 98 F (36.7 C)   98.3 F (36.8 C)  TempSrc:      SpO2: 100%   96%  Weight:      Height:        Intake/Output from previous day:  No intake or output data in the 24 hours ending 12/29/23 1227   Physical Exam: General: Alert, awake, oriented x3, in no acute distress.  Morbidly obese. HEENT: [place]/AT PEERL, EOMI Neck: Trachea midline,  no masses, no thyromegal,y no JVD, no carotid bruit OROPHARYNX:  Moist, No exudate/ erythema/lesions.  Heart: Regular rate and rhythm, without murmurs, rubs, gallops, PMI non-displaced, no heaves or thrills on palpation.  Lungs: Clear to auscultation, no wheezing or rhonchi noted. No increased vocal fremitus resonant to percussion  Abdomen: Soft, nontender, nondistended, positive bowel sounds, no masses no hepatosplenomegaly noted..  Neuro: No focal neurological deficits noted cranial nerves II through XII grossly intact. DTRs 2+ bilaterally upper and lower extremities. Strength 5 out of 5 in bilateral upper and lower extremities. Musculoskeletal: Generalize body tenderness Psychiatric: Patient alert and oriented x3, good insight and cognition, good recent to remote recall. Lymph node survey: No cervical axillary or  inguinal lymphadenopathy noted.  Lab Results:  Basic Metabolic Panel:    Component Value Date/Time   NA 138 12/27/2023 0713   NA 142 09/11/2017 1431   K 4.1 12/27/2023 0713   CL 106 12/27/2023 0713   CO2 23 12/27/2023 0713   BUN 6 12/27/2023 0713   BUN 8 09/11/2017 1431   CREATININE 0.64 12/27/2023 0713   GLUCOSE 114 (H) 12/27/2023 0713   CALCIUM 9.0 12/27/2023 0713   CBC:    Component Value Date/Time   WBC 7.6 12/27/2023 0713   HGB 11.2 (L) 12/27/2023 0713   HGB 13.0 09/11/2017 1431   HCT 33.2 (L) 12/27/2023 0713   HCT 38.4 09/11/2017 1431   PLT 232 12/27/2023 0713   PLT 258 09/11/2017 1431   MCV 71.2 (L) 12/27/2023 0713   MCV 75 (L) 09/11/2017 1431   NEUTROABS 4.5 12/26/2023 1446   NEUTROABS 3.9 09/11/2017 1431   LYMPHSABS 2.7 12/26/2023 1446   LYMPHSABS 2.2 09/11/2017 1431   MONOABS 0.6 12/26/2023 1446   EOSABS 0.2 12/26/2023 1446   EOSABS 0.1 09/11/2017 1431   BASOSABS 0.0 12/26/2023 1446   BASOSABS 0.0 09/11/2017 1431    No results found for this or any previous visit (from the past 240 hours).  Studies/Results: No results found.  Medications: Scheduled Meds:  enoxaparin  (LOVENOX ) injection  40 mg Subcutaneous Q24H   HYDROmorphone    Intravenous Q4H   ketorolac   15 mg Intravenous Q6H   methadone   10 mg Oral BID   methadone   20  mg Oral Daily   senna-docusate  1 tablet Oral BID   Continuous Infusions:   PRN Meds:.acetaminophen , diphenhydrAMINE , hydrOXYzine , naloxone  **AND** sodium chloride  flush, ondansetron , oxycodone , polyethylene glycol  Consultants: None  Procedures: None  Antibiotics: None  Assessment/Plan: Active Problems:   BMI 50.0-59.9, adult (HCC)   Chronic pain syndrome   Constipation due to pain medication   Sickle cell pain crisis (HCC)   Pulmonary hypertension (HCC)   Anemia of chronic disease   Sickle cell disease with crisis (HCC)  Hb Sickle Cell Disease with Pain crisis: Reduce IV fluid to KVO, continue weight based  Dilaudid  PCA at current dose setting of 0.5/10/3, continue IV Toradol  15 mg Q 6 H for a total of 5 days, restart and continue oral home pain medications as ordered. Monitor vitals very closely, Re-evaluate pain scale regularly, 2 L of Oxygen  by [place]. Patient encouraged to ambulate on the hallway today.  Anemia of Chronic Disease: Hemoglobin is stable at baseline today.  There is no clinical indication for blood transfusion at this time.  We will continue to monitor very closely and transfuse as appropriate. Chronic pain Syndrome: Restart and continue oral home pain medications including methadone . Pulmonary hypertension: This is chronic.  Patient does not require supplemental oxygen  beyond the routine 2 L while on Dilaudid  via PCA.  I encouraged incentive spirometry. Morbid obesity: Patient's BMI is now 58.74 kg/m. Patient was counseled extensively about dietary control and lifestyle modification. Patient not able to exercise due to chronic pain. Patient may need outpatient referral to dietitian/nutritionist/weight loss specialist.   Code Status: Full Code Family Communication: N/A Disposition Plan: Not yet ready for discharge  Homer CHRISTELLA Cover NP   If 7PM-7AM, please contact night-coverage.  12/28/23, 12:27 PM  LOS: 3 days

## 2023-12-28 NOTE — Plan of Care (Signed)
  Problem: Education: Goal: Knowledge of vaso-occlusive preventative measures will improve Outcome: Progressing Goal: Awareness of infection prevention will improve Outcome: Progressing Goal: Awareness of signs and symptoms of anemia will improve Outcome: Progressing Goal: Long-term complications will improve Outcome: Progressing   Problem: Self-Care: Goal: Ability to incorporate actions that prevent/reduce pain crisis will improve Outcome: Progressing   Problem: Bowel/Gastric: Goal: Gut motility will be maintained Outcome: Progressing   Problem: Tissue Perfusion: Goal: Complications related to inadequate tissue perfusion will be avoided or minimized Outcome: Progressing   Problem: Respiratory: Goal: Pulmonary complications will be avoided or minimized Outcome: Progressing Goal: Acute Chest Syndrome will be identified early to prevent complications Outcome: Progressing   Problem: Fluid Volume: Goal: Ability to maintain a balanced intake and output will improve Outcome: Progressing   Problem: Sensory: Goal: Pain level will decrease with appropriate interventions Outcome: Progressing   Problem: Education: Goal: Knowledge of General Education information will improve Description: Including pain rating scale, medication(s)/side effects and non-pharmacologic comfort measures Outcome: Progressing   Problem: Clinical Measurements: Goal: Ability to maintain clinical measurements within normal limits will improve Outcome: Progressing Goal: Will remain free from infection Outcome: Progressing Goal: Diagnostic test results will improve Outcome: Progressing Goal: Respiratory complications will improve Outcome: Progressing Goal: Cardiovascular complication will be avoided Outcome: Progressing   Problem: Activity: Goal: Risk for activity intolerance will decrease Outcome: Progressing   Problem: Nutrition: Goal: Adequate nutrition will be maintained Outcome: Progressing    Problem: Pain Managment: Goal: General experience of comfort will improve and/or be controlled Outcome: Progressing   Problem: Skin Integrity: Goal: Risk for impaired skin integrity will decrease Outcome: Progressing

## 2023-12-28 NOTE — TOC Initial Note (Signed)
 Transition of Care [facility]) - Initial/Assessment Note    Patient Details  Name: Stephanie Burch MRN: [id number] Date of Birth: 23-Jul-1980  Transition of Care [facility]) CM/SW Contact:    Bascom Service, RN Phone Number: 12/28/2023, 2:41 PM  Clinical Narrative:Patient's d/c plan home. Has PCP,pharmacy,has own transport home.                   Expected Discharge Plan: Home/Self Care Barriers to Discharge: Continued Medical Work up   Patient Goals and CMS Choice Patient states their goals for this hospitalization and ongoing recovery are:: Home CMS Medicare.gov Compare Post Acute Care list provided to:: Patient Choice offered to / list presented to : Patient [name] ownership interest in [facility].provided to:: Patient    Expected Discharge Plan and Services   Discharge Planning Services: CM Consult   Living arrangements for the past 2 months: Single Family Home                                      Prior Living Arrangements/Services Living arrangements for the past 2 months: Single Family Home Lives with:: Adult Children                   Activities of Daily Living   ADL Screening (condition at time of admission) Independently performs ADLs?: Yes (appropriate for developmental age) Is the patient deaf or have difficulty hearing?: No Does the patient have difficulty seeing, even when wearing glasses/contacts?: No Does the patient have difficulty concentrating, remembering, or making decisions?: No  Permission Sought/Granted                  Emotional Assessment              Admission diagnosis:  Elevated blood pressure reading [R03.0] Sickle-cell disease with pain (HCC) [D57.00] Sickle cell pain crisis (HCC) [D57.00] Patient Active Problem List   Diagnosis Date Noted   Obesity, morbid, BMI 50 or higher (HCC) 08/05/2023   Sickle-cell disease with pain (HCC) 06/01/2023   Sickle cell disease with crisis (HCC) 03/19/2023   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) 03/04/2022   Sickle cell anemia with pain (HCC) 01/30/2022   Anemia of chronic disease 01/03/2022   Sickle cell crisis (HCC) 12/07/2021   Sickle cell pain crisis (HCC) 11/21/2021   Pulmonary hypertension (HCC) 11/21/2021   Hypoxia    Poor venous access 11/09/2019   History of COVID-19 11/07/2019   Vaginal odor 04/20/2019   Chest pain varying with breathing    Vitamin D  deficiency 11/15/2018   Medication management 09/14/2018   Muscle spasms of both lower extremities 08/25/2018   Subjective visual disturbance of right eye 08/25/2018   Hb-SS disease without crisis (HCC) 04/13/2018   Chronic pain syndrome 03/16/2018   Chronic, continuous use of opioids 03/16/2018   Leukocytosis 10/19/2017   Thrombocytopenia 10/19/2017   BMI 50.0-59.9, adult (HCC) 10/19/2017   Constipation due to pain medication 08/29/2014   PCP:  Pcp, No Pharmacy:   [facility] - [address], [facility] [place] [address] Phone: [phone] Fax: [phone]  [facility] [address] [place], [place] - [address] AT [facility] [address] [place] [address] Phone: [phone] Fax: [phone]     Social Drivers of Health (SDOH) Social History: SDOH Screenings  Food Insecurity: No Food Insecurity (12/26/2023)  Housing: Low Risk  (12/26/2023)  Transportation Needs: No Transportation Needs (12/26/2023)  Utilities: Not At Risk (12/26/2023)  Recent Concern: Utilities - At Risk (11/29/2023)  Depression (PHQ2-9): High Risk (12/16/2022)  Stress: Stress Concern Present (02/15/2023)   Received from [facility]  Tobacco Use: Low Risk  (12/26/2023)   SDOH Interventions:     Readmission Risk Interventions    12/01/2023    8:50 AM 10/03/2023   12:55 PM 08/13/2023    8:51 AM  Readmission Risk Prevention Plan  Transportation Screening  Complete Complete Complete  PCP or Specialist Appt within 3-5 Days   Complete  HRI or Home Care Consult   Complete  Social Work Consult for Recovery Care Planning/Counseling   Complete  Palliative Care Screening   Not Applicable  Medication Review Oceanographer) Complete Complete Complete  PCP or Specialist appointment within 3-5 days of discharge Complete Complete   HRI or Home Care Consult Complete Complete   SW Recovery Care/Counseling Consult Complete Complete   Palliative Care Screening Not Applicable Not Applicable   Skilled Nursing Facility Not Applicable Not Applicable

## 2023-12-29 NOTE — Plan of Care (Signed)
  Problem: Activity: Goal: Risk for activity intolerance will decrease Outcome: Progressing   Problem: Nutrition: Goal: Adequate nutrition will be maintained Outcome: Progressing   Problem: Coping: Goal: Level of anxiety will decrease Outcome: Progressing   Problem: Pain Managment: Goal: General experience of comfort will improve and/or be controlled Outcome: Progressing   Problem: Skin Integrity: Goal: Risk for impaired skin integrity will decrease Outcome: Progressing

## 2023-12-29 NOTE — Progress Notes (Signed)
 Patient ID: Stephanie Burch, female   DOB: 1980-02-24, 43 y.o.   MRN: [id number] Subjective: Stephanie Burch is a 43 y.o. female with medical history significant of sickle cell disease, chronic pain syndrome, anemia of chronic disease, history of depression, who presented to the ER complaining of bilateral lower extremity pain rated as 10/10 consistent with her typical sickle cell disease.    Patient is rating her pain at 7/10 this morning and gradually improving.  She denies any fever, cough, chest pain, shortness of breath, nausea, vomiting or diarrhea.  No urinary symptoms.  Objective:  Vital signs in last 24 hours:  Vitals:   12/29/23 0638 12/29/23 0847 12/29/23 1124 12/29/23 1154  BP: (!) 151/79   (!) 143/101  Pulse: 95   82  Resp: 18 15 16 14   Temp: 98 F (36.7 C)   98.3 F (36.8 C)  TempSrc:      SpO2: 100%   96%  Weight:      Height:        Intake/Output from previous day:  No intake or output data in the 24 hours ending 12/29/23 1231   Physical Exam: General: Alert, awake, oriented x3, in no acute distress.  Morbidly obese. HEENT: [place]/AT PEERL, EOMI Neck: Trachea midline,  no masses, no thyromegal,y no JVD, no carotid bruit OROPHARYNX:  Moist, No exudate/ erythema/lesions.  Heart: Regular rate and rhythm, without murmurs, rubs, gallops, PMI non-displaced, no heaves or thrills on palpation.  Lungs: Clear to auscultation, no wheezing or rhonchi noted. No increased vocal fremitus resonant to percussion  Abdomen: Soft, nontender, nondistended, positive bowel sounds, no masses no hepatosplenomegaly noted..  Neuro: No focal neurological deficits noted cranial nerves II through XII grossly intact. DTRs 2+ bilaterally upper and lower extremities. Strength 5 out of 5 in bilateral upper and lower extremities. Musculoskeletal: Generalize body tenderness.  Psychiatric: Patient alert and oriented x3, good insight and cognition, good recent to remote recall. Lymph node survey: No  cervical axillary or inguinal lymphadenopathy noted.  Lab Results:  Basic Metabolic Panel:    Component Value Date/Time   NA 138 12/27/2023 0713   NA 142 09/11/2017 1431   K 4.1 12/27/2023 0713   CL 106 12/27/2023 0713   CO2 23 12/27/2023 0713   BUN 6 12/27/2023 0713   BUN 8 09/11/2017 1431   CREATININE 0.64 12/27/2023 0713   GLUCOSE 114 (H) 12/27/2023 0713   CALCIUM 9.0 12/27/2023 0713   CBC:    Component Value Date/Time   WBC 7.6 12/27/2023 0713   HGB 11.2 (L) 12/27/2023 0713   HGB 13.0 09/11/2017 1431   HCT 33.2 (L) 12/27/2023 0713   HCT 38.4 09/11/2017 1431   PLT 232 12/27/2023 0713   PLT 258 09/11/2017 1431   MCV 71.2 (L) 12/27/2023 0713   MCV 75 (L) 09/11/2017 1431   NEUTROABS 4.5 12/26/2023 1446   NEUTROABS 3.9 09/11/2017 1431   LYMPHSABS 2.7 12/26/2023 1446   LYMPHSABS 2.2 09/11/2017 1431   MONOABS 0.6 12/26/2023 1446   EOSABS 0.2 12/26/2023 1446   EOSABS 0.1 09/11/2017 1431   BASOSABS 0.0 12/26/2023 1446   BASOSABS 0.0 09/11/2017 1431    No results found for this or any previous visit (from the past 240 hours).  Studies/Results: No results found.  Medications: Scheduled Meds:  enoxaparin  (LOVENOX ) injection  40 mg Subcutaneous Q24H   HYDROmorphone    Intravenous Q4H   ketorolac   15 mg Intravenous Q6H   methadone   10 mg Oral BID  methadone   20 mg Oral Daily   senna-docusate  1 tablet Oral BID   Continuous Infusions:   PRN Meds:.acetaminophen , diphenhydrAMINE , hydrOXYzine , naloxone  **AND** sodium chloride  flush, ondansetron , oxycodone , polyethylene glycol  Consultants: None  Procedures: None  Antibiotics: None  Assessment/Plan: Active Problems:   BMI 50.0-59.9, adult (HCC)   Chronic pain syndrome   Constipation due to pain medication   Sickle cell pain crisis (HCC)   Pulmonary hypertension (HCC)   Anemia of chronic disease   Sickle cell disease with crisis (HCC)  Hb Sickle Cell Disease with Pain crisis: Pain is gradually improving,   continue IV fluid @ KVO, continue weight based Dilaudid  PCA at current dose setting of 0.5/10/3, continue IV Toradol  15 mg Q 6 H for a total of 5 days, continue oral home pain medications as ordered. Monitor vitals very closely, Re-evaluate pain scale regularly, 2 L of Oxygen  by [place]. Patient encouraged to ambulate on the hallway today.  Anemia of Chronic Disease: Hemoglobin is stable at baseline today.  There is no clinical indication for blood transfusion at this time.  We will continue to monitor very closely and transfuse as appropriate. Chronic pain Syndrome: continue oral home pain medications including methadone . Pulmonary hypertension: This is chronic.  Patient does not require supplemental oxygen  beyond the routine 2 L while on Dilaudid  via PCA.  I encouraged incentive spirometry. Morbid obesity: Patient's BMI is now 5874 kg/m. Patient was counseled extensively about dietary control and lifestyle modification. Patient not able to exercise due to chronic pain. Patient may need outpatient referral to dietitian/nutritionist/weight loss specialist.   Code Status: Full Code Family Communication: N/A Disposition Plan: Not yet ready for discharge  Homer CHRISTELLA Cover NP   If 7PM-7AM, please contact night-coverage.  12/29/2023, 12:31 PM  LOS: 3 days

## 2023-12-29 NOTE — Plan of Care (Signed)
   Problem: Education: Goal: Knowledge of vaso-occlusive preventative measures will improve Outcome: Progressing Goal: Awareness of infection prevention will improve Outcome: Progressing Goal: Awareness of signs and symptoms of anemia will improve Outcome: Progressing Goal: Long-term complications will improve Outcome: Progressing

## 2023-12-30 NOTE — Plan of Care (Signed)
  Problem: Education: Goal: Knowledge of vaso-occlusive preventative measures will improve Outcome: Progressing Goal: Awareness of infection prevention will improve Outcome: Progressing Goal: Awareness of signs and symptoms of anemia will improve Outcome: Progressing Goal: Long-term complications will improve Outcome: Progressing   Problem: Self-Care: Goal: Ability to incorporate actions that prevent/reduce pain crisis will improve Outcome: Progressing   Problem: Bowel/Gastric: Goal: Gut motility will be maintained Outcome: Progressing   Problem: Tissue Perfusion: Goal: Complications related to inadequate tissue perfusion will be avoided or minimized Outcome: Progressing   Problem: Fluid Volume: Goal: Ability to maintain a balanced intake and output will improve Outcome: Progressing   Problem: Respiratory: Goal: Pulmonary complications will be avoided or minimized Outcome: Progressing Goal: Acute Chest Syndrome will be identified early to prevent complications Outcome: Progressing   Problem: Health Behavior: Goal: Postive changes in compliance with treatment and prescription regimens will improve Outcome: Progressing   Problem: Education: Goal: Knowledge of General Education information will improve Description: Including pain rating scale, medication(s)/side effects and non-pharmacologic comfort measures Outcome: Progressing   Problem: Clinical Measurements: Goal: Ability to maintain clinical measurements within normal limits will improve Outcome: Progressing Goal: Will remain free from infection Outcome: Progressing Goal: Diagnostic test results will improve Outcome: Progressing Goal: Respiratory complications will improve Outcome: Progressing Goal: Cardiovascular complication will be avoided Outcome: Progressing   Problem: Activity: Goal: Risk for activity intolerance will decrease Outcome: Progressing   Problem: Pain Managment: Goal: General experience of  comfort will improve and/or be controlled Outcome: Progressing

## 2023-12-30 NOTE — Progress Notes (Signed)
 Patient ID: JERYN BERTONI, female   DOB: [birth year], 43 y.o.   MRN: [id number] Subjective: AAYANA REINERTSEN is a 43 y.o. female with medical history significant of sickle cell disease, chronic pain syndrome, anemia of chronic disease, history of depression, who presented to the ER complaining of bilateral lower extremity pain rated as 10/10 consistent with her typical sickle cell disease.    Patient is rating her pain at 7/10 this morning, she is gradually improving but states  I am not quite there yet   She denies any fever, cough, chest pain, shortness of breath, nausea, vomiting or diarrhea.  No urinary symptoms.  Objective:  Vital signs in last 24 hours:  Vitals:   12/30/23 0442 12/30/23 0634 12/30/23 0739 12/30/23 1027  BP:  139/82  136/68  Pulse:  78  86  Resp: 15 18 12 14   Temp:  97.8 F (36.6 C)  97.7 F (36.5 C)  TempSrc:    Oral  SpO2: 99% 100% 100% 99%  Weight:      Height:        Intake/Output from previous day:   Intake/Output Summary (Last 24 hours) at 12/30/2023 1113 Last data filed at 12/30/2023 1000 Gross per 24 hour  Intake 384.5 ml  Output --  Net 384.5 ml     Physical Exam: General: Alert, awake, oriented x3, in no acute distress.  Morbidly obese. HEENT: [place]/AT PEERL, EOMI Neck: Trachea midline,  no masses, no thyromegal,y no JVD, no carotid bruit OROPHARYNX:  Moist, No exudate/ erythema/lesions.  Heart: Regular rate and rhythm, without murmurs, rubs, gallops, PMI non-displaced, no heaves or thrills on palpation.  Lungs: Clear to auscultation, no wheezing or rhonchi noted. No increased vocal fremitus resonant to percussion  Abdomen: Soft, nontender, nondistended, positive bowel sounds, no masses no hepatosplenomegaly noted..  Neuro: No focal neurological deficits noted cranial nerves II through XII grossly intact. DTRs 2+ bilaterally upper and lower extremities. Strength 5 out of 5 in bilateral upper and lower extremities. Musculoskeletal: Generalize  body tenderness.  Psychiatric: Patient alert and oriented x3, good insight and cognition, good recent to remote recall. Lymph node survey: No cervical axillary or inguinal lymphadenopathy noted.  Lab Results:  Basic Metabolic Panel:    Component Value Date/Time   NA 138 12/27/2023 0713   NA 142 09/11/2017 1431   K 4.1 12/27/2023 0713   CL 106 12/27/2023 0713   CO2 23 12/27/2023 0713   BUN 6 12/27/2023 0713   BUN 8 09/11/2017 1431   CREATININE 0.64 12/27/2023 0713   GLUCOSE 114 (H) 12/27/2023 0713   CALCIUM 9.0 12/27/2023 0713   CBC:    Component Value Date/Time   WBC 7.6 12/27/2023 0713   HGB 11.2 (L) 12/27/2023 0713   HGB 13.0 09/11/2017 1431   HCT 33.2 (L) 12/27/2023 0713   HCT 38.4 09/11/2017 1431   PLT 232 12/27/2023 0713   PLT 258 09/11/2017 1431   MCV 71.2 (L) 12/27/2023 0713   MCV 75 (L) 09/11/2017 1431   NEUTROABS 4.5 12/26/2023 1446   NEUTROABS 3.9 09/11/2017 1431   LYMPHSABS 2.7 12/26/2023 1446   LYMPHSABS 2.2 09/11/2017 1431   MONOABS 0.6 12/26/2023 1446   EOSABS 0.2 12/26/2023 1446   EOSABS 0.1 09/11/2017 1431   BASOSABS 0.0 12/26/2023 1446   BASOSABS 0.0 09/11/2017 1431    No results found for this or any previous visit (from the past 240 hours).  Studies/Results: No results found.  Medications: Scheduled Meds:  enoxaparin  (LOVENOX ) injection  40 mg Subcutaneous Q24H   HYDROmorphone    Intravenous Q4H   ketorolac   15 mg Intravenous Q6H   methadone   10 mg Oral BID   methadone   20 mg Oral Daily   senna-docusate  1 tablet Oral BID   Continuous Infusions:   PRN Meds:.acetaminophen , diphenhydrAMINE , hydrOXYzine , naloxone  **AND** sodium chloride  flush, ondansetron , oxycodone , polyethylene glycol  Consultants: None  Procedures: None  Antibiotics: None  Assessment/Plan: Active Problems:   BMI 50.0-59.9, adult (HCC)   Chronic pain syndrome   Constipation due to pain medication   Sickle cell pain crisis (HCC)   Pulmonary hypertension  (HCC)   Anemia of chronic disease   Sickle cell disease with crisis (HCC)  Hb Sickle Cell Disease with Pain crisis: Pain is gradually improving,  continue IV fluid @ KVO, continue weight based Dilaudid  PCA at current dose setting of 0.5/10/3, continue IV Toradol  15 mg Q 6 H for a total of 5 days, continue oral home pain medications as ordered. Monitor vitals very closely, Re-evaluate pain scale regularly, 2 L of Oxygen  by [place]. Patient encouraged to ambulate on the hallway today.  Anemia of Chronic Disease: Hemoglobin is stable at baseline today.  There is no clinical indication for blood transfusion at this time.  We will continue to monitor very closely and transfuse as appropriate. Chronic pain Syndrome: continue oral home pain medications including methadone . Pulmonary hypertension: This is chronic.  Patient does not require supplemental oxygen  beyond the routine 2 L while on Dilaudid  via PCA.  I encouraged incentive spirometry. Morbid obesity: Patient's BMI is now 58.74 kg/m. Patient was counseled extensively about dietary control and lifestyle modification. Patient not able to exercise due to chronic pain. Patient may need outpatient referral to dietitian/nutritionist/weight loss specialist.   Code Status: Full Code Family Communication: N/A Disposition Plan: Not yet ready for discharge  Homer CHRISTELLA Cover NP   If 7PM-7AM, please contact night-coverage.  12/30/2023, 11:13 AM  LOS: 4 days

## 2023-12-30 NOTE — Plan of Care (Signed)
   Problem: Education: Goal: Knowledge of vaso-occlusive preventative measures will improve Outcome: Progressing Goal: Awareness of infection prevention will improve Outcome: Progressing Goal: Awareness of signs and symptoms of anemia will improve Outcome: Progressing Goal: Long-term complications will improve Outcome: Progressing

## 2023-12-30 NOTE — Plan of Care (Signed)
  Problem: Sensory: Goal: Pain level will decrease with appropriate interventions Outcome: Progressing   Problem: Activity: Goal: Risk for activity intolerance will decrease Outcome: Progressing   Problem: Coping: Goal: Level of anxiety will decrease Outcome: Progressing   Problem: Pain Managment: Goal: General experience of comfort will improve and/or be controlled Outcome: Progressing

## 2023-12-31 DIAGNOSIS — D5700 Hb-SS disease with crisis, unspecified: Secondary | ICD-10-CM | POA: Diagnosis not present

## 2023-12-31 NOTE — Progress Notes (Signed)
 Education complete. IV removed and PCA wasted with Angeline Alter RN. AVS given to patient and patient left with all belongings.

## 2023-12-31 NOTE — Discharge Summary (Signed)
 Physician Discharge Summary   Patient: Stephanie Burch MRN: [id number] DOB: [birth year]  Admit date:     12/26/2023  Discharge date:   Discharge Physician: SIM KNOLL   PCP: Pcp, No   Recommendations at discharge:   Patient will resume home regimen.  Continue follow-up with her PCP  Discharge Diagnoses: Active Problems:   BMI 50.0-59.9, adult (HCC)   Chronic pain syndrome   Constipation due to pain medication   Sickle cell pain crisis (HCC)   Pulmonary hypertension (HCC)   Anemia of chronic disease   Sickle cell disease with crisis (HCC)  Resolved Problems:   * No resolved hospital problems. [facility] Course: Patient admitted with sickle cell pain crisis.  She has history of sickle cell disease, chronic pain syndrome, anemia of chronic disease and depression.  Patient was treated with Dilaudid  PCA, Toradol , IV fluids.  H&H was followed closely.  No indication for transfusion.  She did have oxygen  due to pulmonary hypertension.  Patient continues to do well.  Pain has gradually come down to her baseline.  At this point she is transition to her home regimen and be discharged home to continue with her home regimen.  Assessment and Plan: No notes have been filed under this hospital service. Service: Hospitalist        Consultants: None Procedures performed: Chest x-ray Disposition: Home Diet recommendation:  Discharge Diet Orders (From admission, onward)     Start     Ordered   12/31/23 0000  Diet - low sodium heart healthy        12/31/23 0834           Regular diet DISCHARGE MEDICATION: Allergies as of 12/31/2023   No Known Allergies      Medication List     TAKE these medications    acetaminophen  500 MG tablet Commonly known as: TYLENOL  Take 1,000 mg by mouth every 6 (six) hours as needed for headache (pain).   ibuprofen  200 MG tablet Commonly known as: ADVIL  Take 800 mg by mouth every 6 (six) hours as needed for headache (pain).   methadone   10 MG tablet Commonly known as: DOLOPHINE  Take 10-20 mg by mouth See admin instructions. Take 2 tablets (20mg ) by mouth in the morning, take 1 tablet (10mg ) around mid-day and take 1 tablet at bedtime.   naloxone  4 MG/0.1ML Liqd nasal spray kit Commonly known as: NARCAN  Use as needed for opiod overdose   oxycodone  30 MG immediate release tablet Commonly known as: ROXICODONE  Take 30 mg by mouth every 4 (four) hours as needed (severe pain, breakthrough pain).   promethazine  25 MG tablet Commonly known as: PHENERGAN  Take 25 mg by mouth every 8 (eight) hours as needed for nausea or vomiting.   valACYclovir  1000 MG tablet Commonly known as: VALTREX  Take 1,000 mg by mouth daily as needed (flares).        Discharge Exam: Filed Weights   12/26/23 2332 12/28/23 0442  Weight: (!) 153 kg (!) 160.1 kg   Constitutional: NAD, calm, comfortable Eyes: PERRL, lids and conjunctivae normal ENMT: Mucous membranes are moist. Posterior pharynx clear of any exudate or lesions.Normal dentition.  Neck: normal, supple, no masses, no thyromegaly Respiratory: clear to auscultation bilaterally, no wheezing, no crackles. Normal respiratory effort. No accessory muscle use.  Cardiovascular: Regular rate and rhythm, no murmurs / rubs / gallops. No extremity edema. 2+ pedal pulses. No carotid bruits.  Abdomen: no tenderness, no masses palpated. No hepatosplenomegaly. Bowel sounds positive.  Musculoskeletal: Good  range of motion, no joint swelling or tenderness, Skin: no rashes, lesions, ulcers. No induration Neurologic: CN 2-12 grossly intact. Sensation intact, DTR normal. Strength 5/5 in all 4.  Psychiatric: Normal judgment and insight. Alert and oriented x 3. Normal mood   Condition at discharge: good  The results of significant diagnostics from this hospitalization (including imaging, microbiology, ancillary and laboratory) are listed below for reference.   Imaging Studies: No results  found.  Microbiology: Results for orders placed or performed during the hospital encounter of 10/01/23  Resp panel by RT-PCR (RSV, Flu A&B, Covid) Anterior Nasal Swab     Status: None   Collection Time: 10/04/23 12:01 PM   Specimen: Anterior Nasal Swab  Result Value Ref Range Status   SARS Coronavirus 2 by RT PCR NEGATIVE NEGATIVE Final    Comment: (NOTE) SARS-CoV-2 target nucleic acids are NOT DETECTED.  The SARS-CoV-2 RNA is generally detectable in upper respiratory specimens during the acute phase of infection. The lowest concentration of SARS-CoV-2 viral copies this assay can detect is 138 copies/mL. A negative result does not preclude SARS-Cov-2 infection and should not be used as the sole basis for treatment or other patient management decisions. A negative result may occur with  improper specimen collection/handling, submission of specimen other than nasopharyngeal swab, presence of viral mutation(s) within the areas targeted by this assay, and inadequate number of viral copies(<138 copies/mL). A negative result must be combined with clinical observations, patient history, and epidemiological information. The expected result is Negative.  Fact Sheet for Patients:  bloggercourse.com  Fact Sheet for Healthcare Providers:  seriousbroker.it  This test is no t yet approved or cleared by the [place]  FDA and  has been authorized for detection and/or diagnosis of SARS-CoV-2 by FDA under an Emergency Use Authorization (EUA). This EUA will remain  in effect (meaning this test can be used) for the duration of the COVID-19 declaration under Section 564(b)(1) of the Act, 21 U.S.C.section 360bbb-3(b)(1), unless the authorization is terminated  or revoked sooner.       Influenza A by PCR NEGATIVE NEGATIVE Final   Influenza B by PCR NEGATIVE NEGATIVE Final    Comment: (NOTE) The Xpert Xpress SARS-CoV-2/FLU/RSV plus assay is  intended as an aid in the diagnosis of influenza from Nasopharyngeal swab specimens and should not be used as a sole basis for treatment. Nasal washings and aspirates are unacceptable for Xpert Xpress SARS-CoV-2/FLU/RSV testing.  Fact Sheet for Patients: bloggercourse.com  Fact Sheet for Healthcare Providers: seriousbroker.it  This test is not yet approved or cleared by the [place]  FDA and has been authorized for detection and/or diagnosis of SARS-CoV-2 by FDA under an Emergency Use Authorization (EUA). This EUA will remain in effect (meaning this test can be used) for the duration of the COVID-19 declaration under Section 564(b)(1) of the Act, 21 U.S.C. section 360bbb-3(b)(1), unless the authorization is terminated or revoked.     Resp Syncytial Virus by PCR NEGATIVE NEGATIVE Final    Comment: (NOTE) Fact Sheet for Patients: bloggercourse.com  Fact Sheet for Healthcare Providers: seriousbroker.it  This test is not yet approved or cleared by the [place]  FDA and has been authorized for detection and/or diagnosis of SARS-CoV-2 by FDA under an Emergency Use Authorization (EUA). This EUA will remain in effect (meaning this test can be used) for the duration of the COVID-19 declaration under Section 564(b)(1) of the Act, 21 U.S.C. section 360bbb-3(b)(1), unless the authorization is terminated or revoked.  Performed at [facility], [address] [place], [place] [address]   Group A Strep by PCR     Status: None   Collection Time: 10/04/23 12:01 PM   Specimen: Anterior Nasal Swab; Sterile Swab  Result Value Ref Range Status   Group A Strep by PCR NOT DETECTED NOT DETECTED Final    Comment: Performed at [facility], [address] [place], [place] [address]    Labs: CBC: Recent Labs  Lab 12/26/23 1446 12/27/23 0713  WBC  8.0 7.6  NEUTROABS 4.5  --   HGB 11.2* 11.2*  HCT 33.6* 33.2*  MCV 70.7* 71.2*  PLT 283 232   Basic Metabolic Panel: Recent Labs  Lab 12/26/23 1446 12/27/23 0713  NA 139 138  K 4.1 4.1  CL 107 106  CO2 24 23  GLUCOSE 86 114*  BUN <5* 6  CREATININE 0.58 0.64  CALCIUM 9.4 9.0  MG  --  2.2  PHOS  --  3.9   Liver Function Tests: Recent Labs  Lab 12/26/23 1446 12/27/23 0713  AST 23 17  ALT 11 8  ALKPHOS 85 80  BILITOT 0.8 0.7  PROT 7.7 7.2  ALBUMIN 3.9 4.0   CBG: No results for input(s): GLUCAP in the last 168 hours.  Discharge time spent: greater than 30 minutes.  SignedBETHA SIM KNOLL, MD [facility] 12/31/2023

## 2023-12-31 NOTE — Hospital Course (Signed)
 Patient admitted with sickle cell pain crisis.  She has history of sickle cell disease, chronic pain syndrome, anemia of chronic disease and depression.  Patient was treated with Dilaudid  PCA, Toradol , IV fluids.  H&H was followed closely.  No indication for transfusion.  She did have oxygen  due to pulmonary hypertension.  Patient continues to do well.  Pain has gradually come down to her baseline.  At this point she is transition to her home regimen and be discharged home to continue with her home regimen.

## 2024-01-03 ENCOUNTER — Encounter (HOSPITAL_BASED_OUTPATIENT_CLINIC_OR_DEPARTMENT_OTHER): Payer: Self-pay

## 2024-01-03 ENCOUNTER — Emergency Department (HOSPITAL_BASED_OUTPATIENT_CLINIC_OR_DEPARTMENT_OTHER)
Admission: EM | Admit: 2024-01-03 | Discharge: 2024-01-03 | Disposition: A | Discharge status: Home / Self Care | Attending: Emergency Medicine | Admitting: Emergency Medicine

## 2024-01-03 ENCOUNTER — Other Ambulatory Visit: Payer: Self-pay

## 2024-01-03 DIAGNOSIS — Z79899 Other long term (current) drug therapy: Secondary | ICD-10-CM | POA: Insufficient documentation

## 2024-01-03 DIAGNOSIS — I10 Essential (primary) hypertension: Secondary | ICD-10-CM | POA: Diagnosis not present

## 2024-01-03 DIAGNOSIS — D5700 Hb-SS disease with crisis, unspecified: Secondary | ICD-10-CM | POA: Diagnosis present

## 2024-01-03 LAB — COMPREHENSIVE METABOLIC PANEL WITH GFR
ALT: 12 U/L (ref 0–44)
AST: 16 U/L (ref 15–41)
Albumin: 4 g/dL (ref 3.5–5.0)
Alkaline Phosphatase: 78 U/L (ref 38–126)
Anion gap: 11 (ref 5–15)
BUN: 5 mg/dL — ABNORMAL LOW (ref 6–20)
CO2: 25 mmol/L (ref 22–32)
Calcium: 9.5 mg/dL (ref 8.9–10.3)
Chloride: 105 mmol/L (ref 98–111)
Creatinine, Ser: 0.71 mg/dL (ref 0.44–1.00)
GFR, Estimated: >60 mL/min (ref >60)
Glucose, Bld: 108 mg/dL — ABNORMAL HIGH (ref 70–99)
Potassium: 4 mmol/L (ref 3.5–5.1)
Sodium: 141 mmol/L (ref 135–145)
Total Bilirubin: 0.7 mg/dL (ref 0.0–1.2)
Total Protein: 7.6 g/dL (ref 6.5–8.1)

## 2024-01-03 LAB — CBC WITH DIFFERENTIAL/PLATELET
Abs Immature Granulocytes: 0.04 K/uL (ref 0.00–0.07)
Basophils Absolute: 0 K/uL (ref 0.0–0.1)
Basophils Relative: 0 %
Eosinophils Absolute: 0.1 K/uL (ref 0.0–0.5)
Eosinophils Relative: 1 %
HCT: 33.7 % — ABNORMAL LOW (ref 36.0–46.0)
Hemoglobin: 11.8 g/dL — ABNORMAL LOW (ref 12.0–15.0)
Immature Granulocytes: 1 %
Lymphocytes Relative: 22 %
Lymphs Abs: 1.7 K/uL (ref 0.7–4.0)
MCH: 23.7 pg — ABNORMAL LOW (ref 26.0–34.0)
MCHC: 35 g/dL (ref 30.0–36.0)
MCV: 67.7 fL — ABNORMAL LOW (ref 80.0–100.0)
Monocytes Absolute: 0.5 K/uL (ref 0.1–1.0)
Monocytes Relative: 7 %
Neutro Abs: 5.2 K/uL (ref 1.7–7.7)
Neutrophils Relative %: 69 %
Platelets: 240 K/uL (ref 150–400)
RBC: 4.98 MIL/uL (ref 3.87–5.11)
RDW: 15.8 % — ABNORMAL HIGH (ref 11.5–15.5)
WBC: 7.5 K/uL (ref 4.0–10.5)
nRBC: 0.3 % — ABNORMAL HIGH (ref 0.0–0.2)

## 2024-01-03 LAB — RETICULOCYTES
Immature Retic Fract: 34.5 % — ABNORMAL HIGH (ref 2.3–15.9)
RBC.: 4.98 MIL/uL (ref 3.87–5.11)
Retic Count, Absolute: 168.8 K/uL (ref 19.0–186.0)
Retic Ct Pct: 3.4 % — ABNORMAL HIGH (ref 0.4–3.1)

## 2024-01-03 MED ORDER — LACTATED RINGERS IV BOLUS
1000.0000 mL | Freq: Once | INTRAVENOUS | Status: AC
  Administered 2024-01-03: 1000 mL via INTRAVENOUS

## 2024-01-03 MED ORDER — ACETAMINOPHEN 500 MG PO TABS
1000.0000 mg | ORAL_TABLET | Freq: Once | ORAL | Status: AC
  Administered 2024-01-03: 1000 mg via ORAL
  Filled 2024-01-03: qty 2

## 2024-01-03 MED ORDER — HYDROMORPHONE HCL 1 MG/ML IJ SOLN
2.0000 mg | INTRAMUSCULAR | Status: AC
  Administered 2024-01-03: 2 mg via INTRAVENOUS
  Filled 2024-01-03: qty 2

## 2024-01-03 MED ORDER — OXYCODONE HCL 5 MG PO TABS
15.0000 mg | ORAL_TABLET | Freq: Once | ORAL | Status: AC
  Administered 2024-01-03: 15 mg via ORAL
  Filled 2024-01-03: qty 3

## 2024-01-03 MED ORDER — KETOROLAC TROMETHAMINE 15 MG/ML IJ SOLN
15.0000 mg | INTRAMUSCULAR | Status: AC
  Administered 2024-01-03: 15 mg via INTRAVENOUS
  Filled 2024-01-03: qty 1

## 2024-01-03 NOTE — ED Notes (Signed)
 Pt requested IVF rate be decreased d/t she has a hx of IVs blowing when the rate is high

## 2024-01-03 NOTE — ED Provider Notes (Cosign Needed Addendum)
 [facility] [facility] Provider Note   CSN: [id number] Arrival date & time: 01/03/24  9044     Patient presents with: Sickle Cell Pain Crisis   Stephanie Burch is a 43 y.o. female with history of sickle cell disease, obesity, hypertension, presents with concern for sickle cell pain that has been ongoing for about 1 week.  She reports pain to her legs bilaterally.  She states this feels consistent with her sickle cell pain.  Denies any abnormal swelling in her legs or any injuries to her legs.  Denies any skin changes to her legs.  Denies any chest pain or shortness of breath.  She reports being admitted to the hospital on 12/26/2023 for sickle cell pain crisis, but states she left prior to resolution of pain because she wanted to be with her family on Thanksgiving.  However, she has not been able to manage the pain with her home oxycodone .    Sickle Cell Pain Crisis Associated symptoms: no chest pain, no cough, no fever and no shortness of breath        Prior to Admission medications   Medication Sig Start Date End Date Taking? Authorizing Provider  acetaminophen  (TYLENOL ) 500 MG tablet Take 1,000 mg by mouth every 6 (six) hours as needed for headache (pain).    [provider]  ibuprofen  (ADVIL ) 200 MG tablet Take 800 mg by mouth every 6 (six) hours as needed for headache (pain).    [provider]  methadone  (DOLOPHINE ) 10 MG tablet Take 10-20 mg by mouth See admin instructions. Take 2 tablets (20mg ) by mouth in the morning, take 1 tablet (10mg ) around mid-day and take 1 tablet at bedtime. 03/26/23   [provider]  naloxone  (NARCAN ) nasal spray 4 mg/0.1 mL Use as needed for opiod overdose 03/04/22   Paseda, Folashade R, FNP  oxycodone  (ROXICODONE ) 30 MG immediate release tablet Take 30 mg by mouth every 4 (four) hours as needed (severe pain, breakthrough pain).    [provider]  promethazine  (PHENERGAN ) 25 MG  tablet Take 25 mg by mouth every 8 (eight) hours as needed for nausea or vomiting.    [provider]  valACYclovir  (VALTREX ) 1000 MG tablet Take 1,000 mg by mouth daily as needed (flares). 01/15/23   [provider]    Allergies: Patient has no known allergies.    Review of Systems  Constitutional:  Negative for fever.  Respiratory:  Negative for cough and shortness of breath.   Cardiovascular:  Negative for chest pain.    Updated Vital Signs BP 138/72   Pulse 77   Temp 98.4 F (36.9 C) (Oral)   Resp 18   SpO2 100%   Physical Exam Vitals and nursing note reviewed.  Constitutional:      General: She is not in acute distress.    Appearance: She is well-developed.     Comments: Appears in pain, tearful  HENT:     Head: Normocephalic and atraumatic.  Eyes:     Conjunctiva/sclera: Conjunctivae normal.  Cardiovascular:     Rate and Rhythm: Normal rate and regular rhythm.     Heart sounds: No murmur heard.    Comments: 2+ dorsalis pedis pulse bilaterally Pulmonary:     Effort: Pulmonary effort is normal. No respiratory distress.     Breath sounds: Normal breath sounds.  Abdominal:     Palpations: Abdomen is soft.     Tenderness: There is no abdominal tenderness.  Musculoskeletal:  General: No swelling.     Cervical back: Neck supple.     Comments: No calf tenderness to palpation bilaterally.  No edema in the lower extremities bilaterally.  No skin change in the lower extremities bilaterally  Skin:    General: Skin is warm and dry.     Capillary Refill: Capillary refill takes less than 2 seconds.  Neurological:     Mental Status: She is alert.  Psychiatric:        Mood and Affect: Mood normal.     (all labs ordered are listed, but only abnormal results are displayed) Labs Reviewed  COMPREHENSIVE METABOLIC PANEL WITH GFR - Abnormal; Notable for the following components:      Result Value   Glucose, Bld 108 (*)    BUN 5 (*)    All other  components within normal limits  CBC WITH DIFFERENTIAL/PLATELET - Abnormal; Notable for the following components:   Hemoglobin 11.8 (*)    HCT 33.7 (*)    MCV 67.7 (*)    MCH 23.7 (*)    RDW 15.8 (*)    nRBC 0.3 (*)    All other components within normal limits  RETICULOCYTES - Abnormal; Notable for the following components:   Retic Ct Pct 3.4 (*)    Immature Retic Fract 34.5 (*)    All other components within normal limits  CBC WITH DIFFERENTIAL/PLATELET  PREGNANCY, URINE    EKG: None  Radiology: No results found.   Procedures   Medications Ordered in the ED  oxyCODONE  (Oxy IR/ROXICODONE ) immediate release tablet 15 mg (has no administration in time range)  ketorolac  (TORADOL ) 15 MG/ML injection 15 mg (15 mg Intravenous Given 01/03/24 1126)  HYDROmorphone  (DILAUDID ) injection 2 mg (2 mg Intravenous Given 01/03/24 1128)  HYDROmorphone  (DILAUDID ) injection 2 mg (2 mg Intravenous Given 01/03/24 1215)  HYDROmorphone  (DILAUDID ) injection 2 mg (2 mg Intravenous Given 01/03/24 1257)  lactated ringers  bolus 1,000 mL ( Intravenous Rate/Dose Change 01/03/24 1219)  acetaminophen  (TYLENOL ) tablet 1,000 mg (1,000 mg Oral Given 01/03/24 1120)                                    Medical Decision Making Amount and/or Complexity of Data Reviewed Labs: ordered.  Risk OTC drugs. Prescription drug management.     Differential diagnosis includes but is not limited to sickle cell crisis, acute chest, DVT, PE, cellulitis  ED Course:  Upon initial evaluation, patient is tearful, appears in pain.  Reporting pain to the legs bilaterally.  Reports this is consistent with sickle cell pain.  On exam, no edema of the lower extremities bilaterally.  No overlying skin change.  She does not have any calf tenderness to palpation or any bony tenderness.  Given bilateral nature and pain consistent with sickle cell pain, less concern for a DVT at this time.  No chest pain or shortness of breath to  suggest PE or acute chest.  Will obtain basic labs and start on medications to treat sickle cell pain  Labs Ordered: I Ordered, and personally interpreted labs.  The pertinent results include:   CBC without leukocytosis.  At baseline. CMP unremarkable Reticulocytes at baseline  Medications Given: IV Dilaudid  Toradol  Tylenol  LR bolus  Upon re-evaluation, patient reports pain has improved significantly with the medications given.  She feels like she will be able to manage at home.  Her labs are at baseline.  Given resolution  of pain with the medications still doubt DVT.  Stable and appropriate for discharge home  Impression: Sickle cell pain flare  Disposition:  The patient was discharged home with instructions to take home medications as prescribed.  Follow-up with her sickle cell team as needed.  Return precautions given and patient verbalized understanding.    Record Review: External records from outside source obtained and reviewed including admission from 12/26/2023     This chart was dictated using voice recognition software, Dragon. Despite the best efforts of this provider to proofread and correct errors, errors may still occur which can change documentation meaning.       Final diagnoses:  Sickle cell pain crisis [facility])    ED Discharge Orders     None          Veta Palma, PA-C 01/03/24 1359    Veta Palma, PA-C 01/03/24 1407    Elnor Hila P, DO 01/09/24 2345

## 2024-01-03 NOTE — ED Triage Notes (Signed)
 Reports sickle cell pain in BLE and L arm. Discharged from [facility] on Thursday.   Denies chest pain or SHOB

## 2024-01-03 NOTE — Discharge Instructions (Signed)
 You were seen here today for your sickle cell pain.  You were given IV Dilaudid , Toradol , and Tylenol  as well as some fluids to help with your pain.  Please continue taking your home oxycodone  as prescribed to help with pain.  Please keep well-hydrated and keep warm to help prevent flareups.  Please return to the ER for any chest pain, shortness of breath, uncontrolled pain, any other new or concerning symptoms

## 2024-01-30 ENCOUNTER — Emergency Department (HOSPITAL_BASED_OUTPATIENT_CLINIC_OR_DEPARTMENT_OTHER)
Admission: EM | Admit: 2024-01-30 | Discharge: 2024-01-30 | Disposition: A | Discharge status: Home / Self Care | Attending: Emergency Medicine | Admitting: Emergency Medicine

## 2024-01-30 ENCOUNTER — Encounter (HOSPITAL_BASED_OUTPATIENT_CLINIC_OR_DEPARTMENT_OTHER): Payer: Self-pay

## 2024-01-30 ENCOUNTER — Other Ambulatory Visit: Payer: Self-pay

## 2024-01-30 DIAGNOSIS — M79651 Pain in right thigh: Secondary | ICD-10-CM | POA: Diagnosis present

## 2024-01-30 DIAGNOSIS — D5700 Hb-SS disease with crisis, unspecified: Secondary | ICD-10-CM | POA: Insufficient documentation

## 2024-01-30 LAB — CBC WITH DIFFERENTIAL/PLATELET
Abs Immature Granulocytes: 0.03 K/uL (ref 0.00–0.07)
Basophils Absolute: 0 K/uL (ref 0.0–0.1)
Basophils Relative: 0 %
Eosinophils Absolute: 0.2 K/uL (ref 0.0–0.5)
Eosinophils Relative: 3 %
HCT: 34 % — ABNORMAL LOW (ref 36.0–46.0)
Hemoglobin: 11.8 g/dL — ABNORMAL LOW (ref 12.0–15.0)
Immature Granulocytes: 0 %
Lymphocytes Relative: 22 %
Lymphs Abs: 1.8 K/uL (ref 0.7–4.0)
MCH: 23.6 pg — ABNORMAL LOW (ref 26.0–34.0)
MCHC: 34.7 g/dL (ref 30.0–36.0)
MCV: 68.1 fL — ABNORMAL LOW (ref 80.0–100.0)
Monocytes Absolute: 0.6 K/uL (ref 0.1–1.0)
Monocytes Relative: 8 %
Neutro Abs: 5.5 K/uL (ref 1.7–7.7)
Neutrophils Relative %: 67 %
Platelets: 261 K/uL (ref 150–400)
RBC: 4.99 MIL/uL (ref 3.87–5.11)
RDW: 15.7 % — ABNORMAL HIGH (ref 11.5–15.5)
WBC: 8.2 K/uL (ref 4.0–10.5)
nRBC: 0.2 % (ref 0.0–0.2)

## 2024-01-30 LAB — COMPREHENSIVE METABOLIC PANEL WITH GFR
ALT: 10 U/L (ref 0–44)
AST: 15 U/L (ref 15–41)
Albumin: 4 g/dL (ref 3.5–5.0)
Alkaline Phosphatase: 85 U/L (ref 38–126)
Anion gap: 10 (ref 5–15)
BUN: <5 mg/dL — ABNORMAL LOW (ref 6–20)
CO2: 25 mmol/L (ref 22–32)
Calcium: 9.4 mg/dL (ref 8.9–10.3)
Chloride: 107 mmol/L (ref 98–111)
Creatinine, Ser: 0.55 mg/dL (ref 0.44–1.00)
GFR, Estimated: >60 mL/min
Glucose, Bld: 110 mg/dL — ABNORMAL HIGH (ref 70–99)
Potassium: 4 mmol/L (ref 3.5–5.1)
Sodium: 142 mmol/L (ref 135–145)
Total Bilirubin: 0.5 mg/dL (ref 0.0–1.2)
Total Protein: 7.7 g/dL (ref 6.5–8.1)

## 2024-01-30 LAB — RETICULOCYTES
Immature Retic Fract: 25.6 % — ABNORMAL HIGH (ref 2.3–15.9)
RBC.: 4.96 MIL/uL (ref 3.87–5.11)
Retic Count, Absolute: 107.6 K/uL (ref 19.0–186.0)
Retic Ct Pct: 2.2 % (ref 0.4–3.1)

## 2024-01-30 MED ORDER — HYDROMORPHONE HCL 1 MG/ML IJ SOLN
2.0000 mg | INTRAMUSCULAR | Status: AC
  Administered 2024-01-30: 2 mg via INTRAVENOUS
  Filled 2024-01-30: qty 2

## 2024-01-30 MED ORDER — ONDANSETRON HCL 4 MG/2ML IJ SOLN
4.0000 mg | INTRAMUSCULAR | Status: DC | PRN
  Administered 2024-01-30: 4 mg via INTRAVENOUS
  Filled 2024-01-30: qty 2

## 2024-01-30 MED ORDER — DIPHENHYDRAMINE HCL 25 MG PO CAPS
25.0000 mg | ORAL_CAPSULE | ORAL | Status: DC | PRN
  Filled 2024-01-30: qty 1

## 2024-01-30 MED ORDER — OXYCODONE HCL 5 MG PO TABS
30.0000 mg | ORAL_TABLET | Freq: Once | ORAL | Status: AC
  Administered 2024-01-30: 30 mg via ORAL
  Filled 2024-01-30: qty 6

## 2024-01-30 MED ORDER — KETOROLAC TROMETHAMINE 15 MG/ML IJ SOLN
15.0000 mg | INTRAMUSCULAR | Status: AC
  Administered 2024-01-30: 15 mg via INTRAVENOUS
  Filled 2024-01-30: qty 1

## 2024-01-30 NOTE — Discharge Instructions (Addendum)
 Please read and follow all provided instructions.  Your diagnoses today include:  1. Sickle cell pain crisis (HCC)    Tests performed today include: Complete blood cell count: Near normal hemoglobin Complete metabolic panel: No concerning findings Vital signs. See below for your results today.   Medications prescribed:  None  Take any prescribed medications only as directed.  Home care instructions:  Follow any educational materials contained in this packet.  BE VERY CAREFUL not to take multiple medicines containing Tylenol  (also called acetaminophen ). Doing so can lead to an overdose which can damage your liver and cause liver failure and possibly death.   Follow-up instructions: Please follow-up with your primary care provider in the next 3 days for further evaluation of your symptoms.   Return instructions:  Please return to the Emergency Department if you experience worsening symptoms.  Return with any infectious symptoms, worsening pain Please return if you have any other emergent concerns.  Additional Information:  Your vital signs today were: BP 110/86 (BP Location: Right Arm)   Pulse 86   Temp 98.3 F (36.8 C) (Oral)   Resp 20   Ht 5' 5 (1.651 m)   Wt (!) 160.1 kg   LMP 12/29/2023   SpO2 95%   BMI 58.73 kg/m  If your blood pressure (BP) was elevated above 135/85 this visit, please have this repeated by your doctor within one month. --------------

## 2024-01-30 NOTE — ED Triage Notes (Signed)
 Reports BIL thigh pain for 3 days. States thinks it is sickle cell pain   Denies chest pain or SHOB

## 2024-01-30 NOTE — ED Notes (Signed)
 Report received from Alie, [place]. Assuming patient care at this time.

## 2024-01-30 NOTE — ED Provider Notes (Signed)
 " [facility] Provider Note   CSN: [id number] Arrival date & time: 01/30/24  1223     Patient presents with: Sickle Cell Pain Crisis   Stephanie Burch is a 43 y.o. female.   Patient with history of sickle cell anemia, on methadone  and oxycodone  --presents to the emergency department for evaluation of bilateral upper leg pain that has been worse over the past 3 days.  Patient states that this pain is typical for her.  She denies fevers, cough, chest pain, shortness of breath.  No history of DVT and she is not on anticoagulation.  Patient has presented with similar symptoms in the past.  Home medications have not been helping.  She states that over the past month she has actually been doing pretty well with the warmer weather.  Denies anything atypical in regards to her current flare.       Prior to Admission medications  Medication Sig Start Date End Date Taking? Authorizing Provider  acetaminophen  (TYLENOL ) 500 MG tablet Take 1,000 mg by mouth every 6 (six) hours as needed for headache (pain).    [provider]  ibuprofen  (ADVIL ) 200 MG tablet Take 800 mg by mouth every 6 (six) hours as needed for headache (pain).    [provider]  methadone  (DOLOPHINE ) 10 MG tablet Take 10-20 mg by mouth See admin instructions. Take 2 tablets (20mg ) by mouth in the morning, take 1 tablet (10mg ) around mid-day and take 1 tablet at bedtime. 03/26/23   [provider]  naloxone  (NARCAN ) nasal spray 4 mg/0.1 mL Use as needed for opiod overdose 03/04/22   Paseda, Folashade R, FNP  oxycodone  (ROXICODONE ) 30 MG immediate release tablet Take 30 mg by mouth every 4 (four) hours as needed (severe pain, breakthrough pain).    [provider]  promethazine  (PHENERGAN ) 25 MG tablet Take 25 mg by mouth every 8 (eight) hours as needed for nausea or vomiting.    [provider]  valACYclovir  (VALTREX ) 1000 MG tablet Take 1,000 mg  by mouth daily as needed (flares). 01/15/23   [provider]    Allergies: Patient has no known allergies.    Review of Systems  Updated Vital Signs BP 139/88 (BP Location: Right Arm)   Pulse 97   Temp 98.3 F (36.8 C)   Resp 18   LMP 12/29/2023   SpO2 96%   Physical Exam Vitals and nursing note reviewed.  Constitutional:      General: She is in acute distress (Appears somewhat uncomfortable).     Appearance: She is well-developed.  HENT:     Head: Normocephalic and atraumatic.     Right Ear: External ear normal.     Left Ear: External ear normal.     Nose: Nose normal.  Eyes:     Conjunctiva/sclera: Conjunctivae normal.  Cardiovascular:     Rate and Rhythm: Normal rate and regular rhythm.     Heart sounds: No murmur heard. Pulmonary:     Effort: No respiratory distress.     Breath sounds: No wheezing, rhonchi or rales.  Abdominal:     Palpations: Abdomen is soft.     Tenderness: There is no abdominal tenderness. There is no guarding or rebound.  Musculoskeletal:     Cervical back: Normal range of motion and neck supple.     Right lower leg: No edema.     Left lower leg: No edema.     Comments: Tenderness in  the bilateral thighs, no swelling.  Bilateral knees without signs of effusion.  Patient with normal range of motion of the hip and knees bilaterally.  2+ DP pulses bilaterally.  Skin:    General: Skin is warm and dry.     Findings: No rash.  Neurological:     General: No focal deficit present.     Mental Status: She is alert. Mental status is at baseline.     Motor: No weakness.  Psychiatric:        Mood and Affect: Mood normal.     (all labs ordered are listed, but only abnormal results are displayed) Labs Reviewed  COMPREHENSIVE METABOLIC PANEL WITH GFR - Abnormal; Notable for the following components:      Result Value   Glucose, Bld 110 (*)    BUN <5 (*)    All other components within normal limits  CBC WITH DIFFERENTIAL/PLATELET -  Abnormal; Notable for the following components:   Hemoglobin 11.8 (*)    HCT 34.0 (*)    MCV 68.1 (*)    MCH 23.6 (*)    RDW 15.7 (*)    All other components within normal limits  RETICULOCYTES - Abnormal; Notable for the following components:   Immature Retic Fract 25.6 (*)    All other components within normal limits  PREGNANCY, URINE    EKG: None  Radiology: No results found.   Procedures   Medications Ordered in the ED  diphenhydrAMINE  (BENADRYL ) capsule 25-50 mg (has no administration in time range)  ondansetron  (ZOFRAN ) injection 4 mg (4 mg Intravenous Given 01/30/24 1317)  ketorolac  (TORADOL ) 15 MG/ML injection 15 mg (15 mg Intravenous Given 01/30/24 1317)  HYDROmorphone  (DILAUDID ) injection 2 mg (2 mg Intravenous Given 01/30/24 1317)  HYDROmorphone  (DILAUDID ) injection 2 mg (2 mg Intravenous Given 01/30/24 1349)  HYDROmorphone  (DILAUDID ) injection 2 mg (2 mg Intravenous Given 01/30/24 1421)  oxyCODONE  (Oxy IR/ROXICODONE ) immediate release tablet 30 mg (30 mg Oral Given 01/30/24 1539)   ED Course  Patient seen and examined. History obtained directly from patient.  Reviewed previous ED visit.  Labs/EKG: Ordered CBC, CMP, reticulocyte count.  Imaging: None ordered  Medications/Fluids: Ordered: Medications per sickle cell protocol  Most recent vital signs reviewed and are as follows: BP 139/88 (BP Location: Right Arm)   Pulse 97   Temp 98.3 F (36.8 C)   Resp 18   LMP 12/29/2023   SpO2 96%   Initial impression: Bilateral upper leg pain, no chest pain or fevers.  Low clinical concern for DVT, PE, acute chest syndrome.  Patient is not hypoxic.  She is not tachycardic on arrival.  She reports typical symptoms.  Will attempt to control pain.  3:57 PM Reassessment performed. Patient appears stable, comfortable.  Patient is requesting discharge.  Labs personally reviewed and interpreted including: CBC with normal Massing blood cell count, hemoglobin slightly low at  11.8 but at patient's baseline; CMP unremarkable; reticulocytes appropriate.  Reviewed pertinent lab work and imaging with patient at bedside. Questions answered.   Most current vital signs reviewed and are as follows: BP 110/86 (BP Location: Right Arm)   Pulse 86   Temp 98.3 F (36.8 C) (Oral)   Resp 20   Ht 5' 5 (1.651 m)   Wt (!) 160.1 kg   LMP 12/29/2023   SpO2 95%   BMI 58.73 kg/m   Plan: Discharge to home.  Patient requesting discharge.  She states that her pain is improved than when she came in and  would like to go home and take her home medications.  She states that she does not want to be in the hospital again and would prefer to go home.  No concerns about leaving at this time.  Prescriptions written for: None  Other home care instructions discussed: Continued use of home medications as prescribed, avoid triggers.  ED return instructions discussed: Uncontrolled pain, fever or other infectious symptoms  Follow-up instructions discussed: Patient encouraged to follow-up with their PCP in 3 days.                                   Medical Decision Making Amount and/or Complexity of Data Reviewed Labs: ordered.  Risk Prescription drug management.   Patient presents with bilateral lower extremity pain consistent with sickle cell crisis.  She was treated here with improvement.  No unilateral pain to suggest DVT.  No joint pain or swelling to suggest joint infection.  Her lab workup is reassuring.  Hemoglobin at baseline.  Patient comfortable with discharged home with continued home medications at this time.  No chest pain, fever, shortness of breath to suggest acute chest syndrome, pneumonia, PE.  The patient's vital signs, pertinent lab work and imaging were reviewed and interpreted as discussed in the ED course. Hospitalization was considered for further testing, treatments, or serial exams/observation. However as patient is well-appearing, has a stable exam, and  reassuring studies today, I do not feel that they warrant admission at this time. This plan was discussed with the patient who verbalizes agreement and comfort with this plan and seems reliable and able to return to the Emergency Department with worsening or changing symptoms.        Final diagnoses:  Sickle cell pain crisis [facility])    ED Discharge Orders     None          Desiderio Chew, PA-C 01/30/24 1600  "

## 2024-01-30 NOTE — ED Notes (Signed)
 Patient aware of necessity of urine sample.

## 2024-01-30 NOTE — ED Notes (Signed)
 Patient denies any chance of pregnancy prior to medication administration.

## 2024-02-17 ENCOUNTER — Telehealth (HOSPITAL_COMMUNITY): Payer: Self-pay | Admitting: *Deleted

## 2024-02-17 ENCOUNTER — Encounter (HOSPITAL_COMMUNITY): Payer: Self-pay | Admitting: Internal Medicine

## 2024-02-17 ENCOUNTER — Non-Acute Institutional Stay (HOSPITAL_COMMUNITY)
Admission: AD | Admit: 2024-02-17 | Discharge: 2024-02-17 | Disposition: A | Discharge status: Home / Self Care | Source: Ambulatory Visit | Attending: Internal Medicine | Admitting: Internal Medicine

## 2024-02-17 DIAGNOSIS — D5700 Hb-SS disease with crisis, unspecified: Secondary | ICD-10-CM | POA: Diagnosis present

## 2024-02-17 DIAGNOSIS — G894 Chronic pain syndrome: Secondary | ICD-10-CM | POA: Diagnosis not present

## 2024-02-17 DIAGNOSIS — D638 Anemia in other chronic diseases classified elsewhere: Secondary | ICD-10-CM | POA: Insufficient documentation

## 2024-02-17 LAB — CBC WITH DIFFERENTIAL/PLATELET
Abs Immature Granulocytes: 0.03 K/uL (ref 0.00–0.07)
Basophils Absolute: 0.1 K/uL (ref 0.0–0.1)
Basophils Relative: 1 %
Eosinophils Absolute: 0.5 K/uL (ref 0.0–0.5)
Eosinophils Relative: 4 %
HCT: 32.4 % — ABNORMAL LOW (ref 36.0–46.0)
Hemoglobin: 11 g/dL — ABNORMAL LOW (ref 12.0–15.0)
Immature Granulocytes: 0 %
Lymphocytes Relative: 33 %
Lymphs Abs: 3.6 K/uL (ref 0.7–4.0)
MCH: 23.6 pg — ABNORMAL LOW (ref 26.0–34.0)
MCHC: 34 g/dL (ref 30.0–36.0)
MCV: 69.5 fL — ABNORMAL LOW (ref 80.0–100.0)
Monocytes Absolute: 1 K/uL (ref 0.1–1.0)
Monocytes Relative: 9 %
Neutro Abs: 5.9 K/uL (ref 1.7–7.7)
Neutrophils Relative %: 53 %
Platelets: 267 K/uL (ref 150–400)
RBC: 4.66 MIL/uL (ref 3.87–5.11)
RDW: 16 % — ABNORMAL HIGH (ref 11.5–15.5)
Smear Review: NORMAL
WBC: 11 K/uL — ABNORMAL HIGH (ref 4.0–10.5)
nRBC: 1.3 % — ABNORMAL HIGH (ref 0.0–0.2)

## 2024-02-17 LAB — COMPREHENSIVE METABOLIC PANEL WITH GFR
ALT: 13 U/L (ref 0–44)
AST: 23 U/L (ref 15–41)
Albumin: 4.1 g/dL (ref 3.5–5.0)
Alkaline Phosphatase: 94 U/L (ref 38–126)
Anion gap: 8 (ref 5–15)
BUN: 7 mg/dL (ref 6–20)
CO2: 26 mmol/L (ref 22–32)
Calcium: 9.5 mg/dL (ref 8.9–10.3)
Chloride: 105 mmol/L (ref 98–111)
Creatinine, Ser: 0.59 mg/dL (ref 0.44–1.00)
GFR, Estimated: >60 mL/min
Glucose, Bld: 107 mg/dL — ABNORMAL HIGH (ref 70–99)
Potassium: 4.1 mmol/L (ref 3.5–5.1)
Sodium: 139 mmol/L (ref 135–145)
Total Bilirubin: 0.7 mg/dL (ref 0.0–1.2)
Total Protein: 7.9 g/dL (ref 6.5–8.1)

## 2024-02-17 LAB — RETICULOCYTES
Immature Retic Fract: 33.6 % — ABNORMAL HIGH (ref 2.3–15.9)
RBC.: 4.57 MIL/uL (ref 3.87–5.11)
Retic Count, Absolute: 156.8 K/uL (ref 19.0–186.0)
Retic Ct Pct: 3.4 % — ABNORMAL HIGH (ref 0.4–3.1)

## 2024-02-17 LAB — PREGNANCY, URINE: Preg Test, Ur: NEGATIVE

## 2024-02-17 LAB — LACTATE DEHYDROGENASE: LDH: 254 U/L — ABNORMAL HIGH (ref 105–235)

## 2024-02-17 MED ORDER — ACETAMINOPHEN 500 MG PO TABS
1000.0000 mg | ORAL_TABLET | Freq: Once | ORAL | Status: AC
  Administered 2024-02-17: 1000 mg via ORAL
  Filled 2024-02-17: qty 2

## 2024-02-17 MED ORDER — POLYETHYLENE GLYCOL 3350 17 G PO PACK: 17.0000 g | PACK | Freq: Every day | ORAL | Status: DC | PRN

## 2024-02-17 MED ORDER — SODIUM CHLORIDE 0.9% FLUSH: 9.0000 mL | INTRAVENOUS | Status: DC | PRN

## 2024-02-17 MED ORDER — ACETAMINOPHEN 500 MG PO TABS: 1000.0000 mg | ORAL_TABLET | Freq: Once | ORAL | Status: DC

## 2024-02-17 MED ORDER — SODIUM CHLORIDE 0.45 % IV SOLN: INTRAVENOUS | Status: DC

## 2024-02-17 MED ORDER — ONDANSETRON HCL 4 MG/2ML IJ SOLN: 4.0000 mg | Freq: Four times a day (QID) | INTRAMUSCULAR | Status: DC | PRN

## 2024-02-17 MED ORDER — SENNOSIDES-DOCUSATE SODIUM 8.6-50 MG PO TABS: 1.0000 | ORAL_TABLET | Freq: Two times a day (BID) | ORAL | Status: DC

## 2024-02-17 MED ORDER — NALOXONE HCL 0.4 MG/ML IJ SOLN: 0.4000 mg | INTRAMUSCULAR | Status: DC | PRN

## 2024-02-17 MED ORDER — KETOROLAC TROMETHAMINE 15 MG/ML IJ SOLN
15.0000 mg | Freq: Once | INTRAMUSCULAR | Status: AC
  Administered 2024-02-17: 15 mg via INTRAVENOUS
  Filled 2024-02-17: qty 1

## 2024-02-17 MED ORDER — HYDROMORPHONE 1 MG/ML IV SOLN
INTRAVENOUS | Status: DC
  Administered 2024-02-17: 17 mg via INTRAVENOUS
  Administered 2024-02-17: 30 mg via INTRAVENOUS
  Filled 2024-02-17: qty 30

## 2024-02-17 MED ORDER — DIPHENHYDRAMINE HCL 25 MG PO CAPS
25.0000 mg | ORAL_CAPSULE | ORAL | Status: DC | PRN
  Administered 2024-02-17: 25 mg via ORAL
  Filled 2024-02-17: qty 1

## 2024-02-17 MED ORDER — ACETAMINOPHEN 500 MG PO TABS
ORAL_TABLET | ORAL | Status: AC
  Filled 2024-02-17: qty 2

## 2024-02-17 NOTE — Discharge Summary (Signed)
 Physician Discharge Summary  Stephanie Burch FMW:[id number] DOB: 11/13/1980 DOA: 02/17/2024  PCP: Pcp, No  Admit date: 02/17/2024  Discharge date: 02/17/2024  Time spent: 30 minutes  Discharge Diagnoses:  Principal Problem:   Sickle cell anemia with pain [facility])   Discharge Condition: Stable  Diet recommendation: Regular  History of present illness:  Stephanie Burch is a 44 y.o. female with history of sickle cell disease, chronic pain syndrome, anemia of chronic disease, history of depression, who presented to the day clinic complaining of bilateral lower extremity pain rated as 10/10 consistent with her typical sickle cell disease. Denies chest pain, abd pain, fever, N/V/D. She last took Oxycodone  30 mg and methadone  10 mg at 0600 am.   Hospital Course:  Stephanie Burch was admitted to the day hospital with sickle cell painful crisis. Patient was treated with IV fluid, weight based IV Dilaudid  PCA, clinician assisted doses as deemed appropriate, and other adjunct therapies per sickle cell pain management protocol. Stephanie Burch showed improvement symptomatically, pain improved from 10/10 to 7/10 at the time of discharge. Patient was discharged home in a hemodynamically stable condition. Stephanie Burch will follow-up at the clinic as previously scheduled, continue with home medications as per prior to admission.  Discharge Instructions We discussed the need for good hydration, monitoring of hydration status, avoidance of heat, cold, stress, and infection triggers. We discussed the need to be compliant with taking her home medications. Stephanie Burch was reminded of the need to seek medical attention immediately if any symptom of bleeding, anemia, or infection occurs.  Discharge Exam: Vitals:   02/17/24 1200 02/17/24 1420  BP: 125/65 113/68  Pulse:  97  Resp: 13 12  Temp:    SpO2: 97% 93%    General appearance: alert, cooperative and no distress Eyes: conjunctivae/corneas clear. PERRL, EOM's intact.  Fundi benign. Neck: no adenopathy, no carotid bruit, no JVD, supple, symmetrical, trachea midline and thyroid  not enlarged, symmetric, no tenderness/mass/nodules Back: symmetric, no curvature. ROM normal. No CVA tenderness. Resp: clear to auscultation bilaterally Chest wall: no tenderness Cardio: regular rate and rhythm, S1, S2 normal, no murmur, click, rub or gallop GI: soft, non-tender; bowel sounds normal; no masses,  no organomegaly Extremities: extremities normal, atraumatic, no cyanosis or edema Pulses: 2+ and symmetric Skin: Skin color, texture, turgor normal. No rashes or lesions Neurologic: Grossly normal  Discharge Instructions     Call MD for:  persistant nausea and vomiting   Complete by: As directed    Call MD for:  severe uncontrolled pain   Complete by: As directed    Call MD for:  temperature >100.4   Complete by: As directed    Increase activity slowly   Complete by: As directed       Allergies as of 02/17/2024   No Known Allergies      Medication List     TAKE these medications    acetaminophen  500 MG tablet Commonly known as: TYLENOL  Take 1,000 mg by mouth every 6 (six) hours as needed for headache (pain).   ibuprofen  200 MG tablet Commonly known as: ADVIL  Take 800 mg by mouth every 6 (six) hours as needed for headache (pain).   methadone  10 MG tablet Commonly known as: DOLOPHINE  Take 10-20 mg by mouth See admin instructions. Take 2 tablets (20mg ) by mouth in the morning, take 1 tablet (10mg ) around mid-day and take 1 tablet at bedtime.   naloxone  4 MG/0.1ML Liqd nasal spray kit Commonly known as: NARCAN  Use as needed for  opiod overdose   oxycodone  30 MG immediate release tablet Commonly known as: ROXICODONE  Take 30 mg by mouth every 4 (four) hours as needed (severe pain, breakthrough pain).   promethazine  25 MG tablet Commonly known as: PHENERGAN  Take 25 mg by mouth every 8 (eight) hours as needed for nausea or vomiting.   valACYclovir   1000 MG tablet Commonly known as: VALTREX  Take 1,000 mg by mouth daily as needed (flares).       Allergies[1]   Significant Diagnostic Studies: No results found.  Signed:  Homer CHRISTELLA Cover NP   02/17/2024, 3:08 PM     [1] No Known Allergies

## 2024-02-17 NOTE — Discharge Instructions (Signed)
 [facility]RA

## 2024-02-17 NOTE — Progress Notes (Signed)
 Patient seen admitted to day hospital for treatment of sickle cell crisis pain in back 9/10.  Patient given IV hydration NS 0.45%.  infusion received via PIV.  Patient alert, oriented, and ambulatory at discharge.  Pain was  5/10 .  Vital signs stable. Patient declined AVS.

## 2024-02-17 NOTE — Telephone Encounter (Signed)
 Patient called in. Complains of pain in legs rates 8/10. Denied chest pain, abd pain, fever, N/V/D. Wants to come in for treatment. Last took Oxycodone  30 mg and methadone  10 mg  at 0600 am. Denies all other signs and symptoms.  Spoke with provider and patient can come to day hospital for treatment.  Spoke with patient and she verbalizes understanding and will be here shortly

## 2024-02-17 NOTE — H&P (Signed)
 [facility] History and Physical  Stephanie Burch FMW:[id number] DOB: 1980/09/09 DOA: 02/17/2024  PCP: Pcp, No   Chief Complaint: No chief complaint on file.   HPI: Stephanie Burch is a 44 y.o. female with history of sickle cell disease, chronic pain syndrome, anemia of chronic disease, history of depression, who presented to the day clinic complaining of bilateral lower extremity pain rated as 10/10 consistent with her typical sickle cell disease. Denies chest pain, abd pain, fever, N/V/D. She last took Oxycodone  30 mg and methadone  10 mg  at 0600 am.   Systemic Review: General: The patient denies anorexia, fever, weight loss Cardiac: Denies chest pain, syncope, palpitations, pedal edema  Respiratory: Denies cough, shortness of breath, wheezing GI: Denies severe indigestion/heartburn, abdominal pain, nausea, vomiting, diarrhea and constipation GU: Denies hematuria, incontinence, dysuria  Musculoskeletal: Denies arthritis  Skin: Denies suspicious skin lesions Neurologic: Denies focal weakness or numbness, change in vision  Past Medical History:  Diagnosis Date   Acute kidney injury 11/07/2019   Anemia    Chronic pain    COVID-19 virus detected 10/22/2019   Depression    HCAP (healthcare-associated pneumonia) 11/07/2019   History of blood transfusion 2013   x 2   HSV infection    Hypoxia    Pneumonia    x 1   Pulmonary edema 11/11/2019   Sepsis (HCC) 11/07/2019   Sickle cell anemia (HCC) 09/2021   gets them frequently   Tachycardia with heart rate 100-120 beats per minute    Vitamin B12 deficiency 12/2018   Vitamin D  deficiency     Past Surgical History:  Procedure Laterality Date   CESAREAN SECTION  2013   x 1   TOOTH EXTRACTION N/A 10/18/2021   Procedure: DENTAL RESTORATION/EXTRACTIONS;  Surgeon: Sheryle Hamilton, DMD;  Location: [facility] OR;  Service: Oral Surgery;  Laterality: N/A;    Allergies[1]  Family History  Problem Relation Age of Onset   Hypertension  Mother    Sickle cell trait Mother    Diabetes Father    Sickle cell trait Father       Prior to Admission medications  Medication Sig Start Date End Date Taking? Authorizing Provider  acetaminophen  (TYLENOL ) 500 MG tablet Take 1,000 mg by mouth every 6 (six) hours as needed for headache (pain).    [provider]  ibuprofen  (ADVIL ) 200 MG tablet Take 800 mg by mouth every 6 (six) hours as needed for headache (pain).    [provider]  methadone  (DOLOPHINE ) 10 MG tablet Take 10-20 mg by mouth See admin instructions. Take 2 tablets (20mg ) by mouth in the morning, take 1 tablet (10mg ) around mid-day and take 1 tablet at bedtime. 03/26/23   [provider]  naloxone  (NARCAN ) nasal spray 4 mg/0.1 mL Use as needed for opiod overdose 03/04/22   Paseda, Folashade R, FNP  oxycodone  (ROXICODONE ) 30 MG immediate release tablet Take 30 mg by mouth every 4 (four) hours as needed (severe pain, breakthrough pain).    [provider]  promethazine  (PHENERGAN ) 25 MG tablet Take 25 mg by mouth every 8 (eight) hours as needed for nausea or vomiting.    [provider]  valACYclovir  (VALTREX ) 1000 MG tablet Take 1,000 mg by mouth daily as needed (flares). 01/15/23   [provider]     Physical Exam: Vitals:   02/17/24 0946 02/17/24 0949 02/17/24 1200  BP:  (!) 106/57 125/65  Resp: (!) 21 16 13   Temp:  98.5 F (  36.9 C)   TempSrc:  Temporal   SpO2:   97%    General: Alert, awake, afebrile, anicteric, not in obvious distress HEENT: Normocephalic and Atraumatic, Mucous membranes pink                PERRLA; EOM intact; No scleral icterus,                 Nares: Patent, Oropharynx: Clear, Fair Dentition                 Neck: FROM, no cervical lymphadenopathy, thyromegaly, carotid bruit or JVD;  CHEST WALL: No tenderness  CHEST: Normal respiration, clear to auscultation bilaterally  HEART: Regular rate and rhythm; no murmurs rubs or gallops  BACK: No  kyphosis or scoliosis; no CVA tenderness  ABDOMEN: Positive Bowel Sounds, soft, non-tender; no masses, no organomegaly EXTREMITIES: No cyanosis, clubbing, or edema SKIN:  no rash or ulceration  CNS: Alert and Oriented x 4, Nonfocal exam, CN 2-12 intact  Labs on Admission:  Basic Metabolic Panel: Recent Labs  Lab 02/17/24 0917  NA 139  K 4.1  CL 105  CO2 26  GLUCOSE 107*  BUN 7  CREATININE 0.59  CALCIUM 9.5   Liver Function Tests: Recent Labs  Lab 02/17/24 0917  AST 23  ALT 13  ALKPHOS 94  BILITOT 0.7  PROT 7.9  ALBUMIN 4.1   No results for input(s): LIPASE, AMYLASE in the last 168 hours. No results for input(s): AMMONIA in the last 168 hours. CBC: Recent Labs  Lab 02/17/24 0917  WBC 11.0*  NEUTROABS 5.9  HGB 11.0*  HCT 32.4*  MCV 69.5*  PLT 267   Cardiac Enzymes: No results for input(s): CKTOTAL, CKMB, CKMBINDEX, TROPONINI in the last 168 hours.  BNP (last 3 results) No results for input(s): BNP in the last 8760 hours.  ProBNP (last 3 results) No results for input(s): PROBNP in the last 8760 hours.  CBG: No results for input(s): GLUCAP in the last 168 hours.   Assessment/Plan Principal Problem:   Sickle cell anemia with pain (HCC)  Admits to the Day Hospital for extended observation IVF 0.45% Saline @ 75 mls/hour Weight based Dilaudid  PCA started within 30 minutes of admission Acetaminophen  1000 mg x 1 dose Labs: CBCD, CMP, Retic Count and LDH Monitor vitals very closely, Re-evaluate pain scale every hour 2 L of Oxygen  by [place] Patient will be re-evaluated for pain in the context of function and relationship to baseline as care progresses. If no significant relieve from pain (remains above 5/10) will transfer patient to inpatient services for further evaluation and management  Code Status: Full  Family Communication: None  DVT Prophylaxis: Ambulate as tolerated   Time spent: 35 Minutes  Homer CHRISTELLA Cover NP   If 7PM-7AM,  please contact night-coverage [email or web address] 02/17/2024, 12:09 PM     [1] No Known Allergies
